# Patient Record
Sex: Male | Born: 1945
Health system: Southern US, Community
[De-identification: ages and names within clinical notes are randomized; demographics above are authoritative.]

## PROBLEM LIST (undated history)

## (undated) DIAGNOSIS — E785 Hyperlipidemia, unspecified: Secondary | ICD-10-CM

## (undated) DIAGNOSIS — I471 Supraventricular tachycardia, unspecified: Secondary | ICD-10-CM

## (undated) DIAGNOSIS — I5042 Chronic combined systolic (congestive) and diastolic (congestive) heart failure: Secondary | ICD-10-CM

## (undated) DIAGNOSIS — I5022 Chronic systolic (congestive) heart failure: Secondary | ICD-10-CM

## (undated) DIAGNOSIS — I509 Heart failure, unspecified: Secondary | ICD-10-CM

## (undated) DIAGNOSIS — M1990 Unspecified osteoarthritis, unspecified site: Secondary | ICD-10-CM

## (undated) DIAGNOSIS — I340 Nonrheumatic mitral (valve) insufficiency: Secondary | ICD-10-CM

## (undated) DIAGNOSIS — N1832 Chronic kidney disease, stage 3b: Secondary | ICD-10-CM

## (undated) DIAGNOSIS — I4892 Unspecified atrial flutter: Secondary | ICD-10-CM

## (undated) DIAGNOSIS — I428 Other cardiomyopathies: Secondary | ICD-10-CM

## (undated) DIAGNOSIS — Z950 Presence of cardiac pacemaker: Secondary | ICD-10-CM

## (undated) DIAGNOSIS — K7581 Nonalcoholic steatohepatitis (NASH): Secondary | ICD-10-CM

## (undated) DIAGNOSIS — I350 Nonrheumatic aortic (valve) stenosis: Secondary | ICD-10-CM

## (undated) DIAGNOSIS — I10 Essential (primary) hypertension: Secondary | ICD-10-CM

## (undated) DIAGNOSIS — Z6837 Body mass index (BMI) 37.0-37.9, adult: Secondary | ICD-10-CM

## (undated) HISTORY — DX: Hyperlipidemia, unspecified: E78.5

## (undated) HISTORY — DX: Unspecified atrial flutter: I48.92

## (undated) HISTORY — DX: Unspecified osteoarthritis, unspecified site: M19.90

## (undated) HISTORY — DX: Essential (primary) hypertension: I10

## (undated) HISTORY — DX: Chronic combined systolic (congestive) and diastolic (congestive) heart failure: I50.42

## (undated) HISTORY — PX: LUMBAR SPINE SURGERY: SHX701

---

## 2001-04-02 ENCOUNTER — Ambulatory Visit (HOSPITAL_COMMUNITY): Admission: RE | Admit: 2001-04-02 | Discharge: 2001-04-02 | Payer: Self-pay | Admitting: Family Medicine

## 2002-04-16 ENCOUNTER — Ambulatory Visit (HOSPITAL_COMMUNITY): Admission: RE | Admit: 2002-04-16 | Discharge: 2002-04-16 | Payer: Self-pay | Admitting: Family Medicine

## 2002-04-16 ENCOUNTER — Encounter: Payer: Self-pay | Admitting: Family Medicine

## 2002-09-21 ENCOUNTER — Encounter: Payer: Self-pay | Admitting: Specialist

## 2002-09-30 ENCOUNTER — Encounter: Payer: Self-pay | Admitting: Specialist

## 2002-09-30 ENCOUNTER — Inpatient Hospital Stay (HOSPITAL_COMMUNITY): Admission: RE | Admit: 2002-09-30 | Discharge: 2002-10-02 | Payer: Self-pay | Admitting: Specialist

## 2003-03-27 HISTORY — PX: KNEE ARTHROSCOPY: SHX127

## 2003-09-28 ENCOUNTER — Ambulatory Visit (HOSPITAL_COMMUNITY): Admission: RE | Admit: 2003-09-28 | Discharge: 2003-09-28 | Payer: Self-pay | Admitting: Family Medicine

## 2004-05-03 ENCOUNTER — Ambulatory Visit (HOSPITAL_COMMUNITY): Admission: RE | Admit: 2004-05-03 | Discharge: 2004-05-03 | Payer: Self-pay | Admitting: Family Medicine

## 2004-05-08 ENCOUNTER — Ambulatory Visit: Payer: Self-pay | Admitting: Orthopedic Surgery

## 2004-05-09 ENCOUNTER — Ambulatory Visit: Payer: Self-pay | Admitting: Cardiology

## 2004-05-09 ENCOUNTER — Ambulatory Visit (HOSPITAL_COMMUNITY): Admission: RE | Admit: 2004-05-09 | Discharge: 2004-05-09 | Payer: Self-pay | Admitting: Orthopedic Surgery

## 2004-05-17 ENCOUNTER — Ambulatory Visit: Payer: Self-pay | Admitting: Cardiology

## 2004-05-17 ENCOUNTER — Ambulatory Visit (HOSPITAL_COMMUNITY): Admission: RE | Admit: 2004-05-17 | Discharge: 2004-05-17 | Payer: Self-pay | Admitting: Cardiology

## 2004-05-25 ENCOUNTER — Ambulatory Visit: Payer: Self-pay | Admitting: Cardiology

## 2004-05-31 ENCOUNTER — Ambulatory Visit: Payer: Self-pay | Admitting: Cardiology

## 2004-06-07 ENCOUNTER — Ambulatory Visit: Payer: Self-pay | Admitting: Orthopedic Surgery

## 2004-06-12 ENCOUNTER — Ambulatory Visit: Payer: Self-pay | Admitting: Internal Medicine

## 2004-06-13 ENCOUNTER — Ambulatory Visit (HOSPITAL_COMMUNITY): Admission: RE | Admit: 2004-06-13 | Discharge: 2004-06-13 | Payer: Self-pay | Admitting: Orthopedic Surgery

## 2004-06-13 ENCOUNTER — Ambulatory Visit: Payer: Self-pay | Admitting: Orthopedic Surgery

## 2004-06-15 ENCOUNTER — Ambulatory Visit: Payer: Self-pay | Admitting: Orthopedic Surgery

## 2004-06-16 ENCOUNTER — Encounter (HOSPITAL_COMMUNITY): Admission: RE | Admit: 2004-06-16 | Discharge: 2004-07-16 | Payer: Self-pay | Admitting: Orthopedic Surgery

## 2004-07-14 ENCOUNTER — Ambulatory Visit: Payer: Self-pay | Admitting: Internal Medicine

## 2004-07-19 ENCOUNTER — Ambulatory Visit: Payer: Self-pay | Admitting: Orthopedic Surgery

## 2004-07-19 ENCOUNTER — Encounter (HOSPITAL_COMMUNITY): Admission: RE | Admit: 2004-07-19 | Discharge: 2004-08-18 | Payer: Self-pay | Admitting: Orthopedic Surgery

## 2004-08-09 ENCOUNTER — Ambulatory Visit: Payer: Self-pay | Admitting: Orthopedic Surgery

## 2004-09-07 ENCOUNTER — Ambulatory Visit: Payer: Self-pay | Admitting: Internal Medicine

## 2004-09-11 ENCOUNTER — Ambulatory Visit: Payer: Self-pay | Admitting: Orthopedic Surgery

## 2004-10-29 ENCOUNTER — Emergency Department (HOSPITAL_COMMUNITY): Admission: EM | Admit: 2004-10-29 | Discharge: 2004-10-29 | Payer: Self-pay | Admitting: Emergency Medicine

## 2004-11-21 ENCOUNTER — Ambulatory Visit (HOSPITAL_COMMUNITY): Admission: RE | Admit: 2004-11-21 | Discharge: 2004-11-21 | Payer: Self-pay | Admitting: Internal Medicine

## 2004-11-21 ENCOUNTER — Ambulatory Visit: Payer: Self-pay | Admitting: Internal Medicine

## 2004-11-21 ENCOUNTER — Encounter (INDEPENDENT_AMBULATORY_CARE_PROVIDER_SITE_OTHER): Payer: Self-pay | Admitting: Internal Medicine

## 2006-08-26 ENCOUNTER — Ambulatory Visit: Payer: Self-pay | Admitting: Cardiology

## 2007-06-27 ENCOUNTER — Ambulatory Visit (HOSPITAL_COMMUNITY): Admission: RE | Admit: 2007-06-27 | Discharge: 2007-06-27 | Payer: Self-pay | Admitting: Cardiology

## 2007-06-27 ENCOUNTER — Ambulatory Visit: Payer: Self-pay | Admitting: Cardiology

## 2007-06-27 LAB — CONVERTED CEMR LAB: Brain Natriuretic Peptide: 253

## 2007-06-30 ENCOUNTER — Encounter: Payer: Self-pay | Admitting: Cardiology

## 2007-06-30 ENCOUNTER — Ambulatory Visit: Payer: Self-pay | Admitting: Cardiology

## 2007-06-30 ENCOUNTER — Ambulatory Visit (HOSPITAL_COMMUNITY): Admission: RE | Admit: 2007-06-30 | Discharge: 2007-06-30 | Payer: Self-pay | Admitting: Cardiology

## 2007-08-06 ENCOUNTER — Ambulatory Visit: Payer: Self-pay | Admitting: Cardiology

## 2007-09-03 ENCOUNTER — Ambulatory Visit: Payer: Self-pay | Admitting: Cardiology

## 2007-11-08 LAB — CONVERTED CEMR LAB: Hgb A1c MFr Bld: 6.8 %

## 2007-12-09 ENCOUNTER — Ambulatory Visit: Payer: Self-pay | Admitting: Cardiology

## 2008-09-28 ENCOUNTER — Encounter (INDEPENDENT_AMBULATORY_CARE_PROVIDER_SITE_OTHER): Payer: Self-pay | Admitting: *Deleted

## 2008-10-07 ENCOUNTER — Encounter (INDEPENDENT_AMBULATORY_CARE_PROVIDER_SITE_OTHER): Payer: Self-pay | Admitting: *Deleted

## 2008-10-14 ENCOUNTER — Telehealth: Payer: Self-pay | Admitting: Cardiology

## 2009-12-19 ENCOUNTER — Encounter (INDEPENDENT_AMBULATORY_CARE_PROVIDER_SITE_OTHER): Payer: Self-pay | Admitting: *Deleted

## 2009-12-19 LAB — CONVERTED CEMR LAB
AST: 19 units/L
Albumin: 4.2 g/dL
Bilirubin, Direct: 0.2 mg/dL
Cholesterol: 201 mg/dL
Creatinine, Ser: 0.85 mg/dL
HDL: 54 mg/dL
LDL Cholesterol: 97 mg/dL
Potassium: 4.6 meq/L
Total Protein: 7.2 g/dL
Triglycerides: 248 mg/dL

## 2009-12-20 ENCOUNTER — Encounter (INDEPENDENT_AMBULATORY_CARE_PROVIDER_SITE_OTHER): Payer: Self-pay | Admitting: *Deleted

## 2009-12-23 ENCOUNTER — Ambulatory Visit: Payer: Self-pay | Admitting: Cardiology

## 2009-12-23 DIAGNOSIS — I10 Essential (primary) hypertension: Secondary | ICD-10-CM

## 2009-12-23 DIAGNOSIS — M1990 Unspecified osteoarthritis, unspecified site: Secondary | ICD-10-CM | POA: Insufficient documentation

## 2009-12-23 DIAGNOSIS — I471 Supraventricular tachycardia: Secondary | ICD-10-CM

## 2009-12-23 DIAGNOSIS — E782 Mixed hyperlipidemia: Secondary | ICD-10-CM | POA: Insufficient documentation

## 2009-12-23 DIAGNOSIS — E1159 Type 2 diabetes mellitus with other circulatory complications: Secondary | ICD-10-CM

## 2010-01-05 ENCOUNTER — Telehealth (INDEPENDENT_AMBULATORY_CARE_PROVIDER_SITE_OTHER): Payer: Self-pay | Admitting: *Deleted

## 2010-01-11 ENCOUNTER — Telehealth (INDEPENDENT_AMBULATORY_CARE_PROVIDER_SITE_OTHER): Payer: Self-pay

## 2010-02-03 ENCOUNTER — Ambulatory Visit: Payer: Self-pay | Admitting: Cardiology

## 2010-02-03 ENCOUNTER — Encounter: Payer: Self-pay | Admitting: Cardiology

## 2010-03-01 ENCOUNTER — Telehealth (INDEPENDENT_AMBULATORY_CARE_PROVIDER_SITE_OTHER): Payer: Self-pay

## 2010-03-13 ENCOUNTER — Ambulatory Visit: Payer: Self-pay | Admitting: Cardiology

## 2010-04-18 ENCOUNTER — Encounter: Payer: Self-pay | Admitting: Cardiology

## 2010-04-18 ENCOUNTER — Ambulatory Visit
Admission: RE | Admit: 2010-04-18 | Discharge: 2010-04-18 | Payer: Self-pay | Source: Home / Self Care | Attending: Cardiology | Admitting: Cardiology

## 2010-04-18 ENCOUNTER — Encounter (INDEPENDENT_AMBULATORY_CARE_PROVIDER_SITE_OTHER): Payer: Self-pay | Admitting: *Deleted

## 2010-04-25 NOTE — Assessment & Plan Note (Signed)
Summary: past due for 1 yr f/u per pt phone call/tg  Medications Added METOPROLOL SUCCINATE 100 MG XR24H-TAB (METOPROLOL SUCCINATE) Take one tablet by mouth daily LIPITOR 40 MG TABS (ATORVASTATIN CALCIUM) take 1 tab daily HYDROCHLOROTHIAZIDE 25 MG TABS (HYDROCHLOROTHIAZIDE) take 1/2 tab daily VERAPAMIL HCL CR 240 MG CR-TABS (VERAPAMIL HCL) Take 1 tablet by mouth once a day JANUMET 50-1000 MG TABS (SITAGLIPTIN-METFORMIN HCL) take 1 tab two times a day GLYBURIDE 5 MG TABS (GLYBURIDE) take 2 tabs two times a day CIALIS 20 MG TABS (TADALAFIL) take as needed BENAZEPRIL HCL 20 MG TABS (BENAZEPRIL HCL) Take 1 tablet by mouth once a day CATAPRES-TTS-3 0.3 MG/24HR PTWK (CLONIDINE HCL) apply one patch weekly      Allergies Added: NKDA  Visit Type:  Follow-up Primary Provider:  Dr. Rosemary Holms   History of Present Illness: John [name] returns to the office after a two-year hiatus for reassessment of PSVT, hypertension and mild aortic stenosis.  Since his last visit, he has done extremely well.  He has not been hospitalized or required urgent medical care.  He has no new medical problems.  BP control has been somewhat suboptimal, but antihypertensive medications have been increased.  He does not assess blood pressure on his own.  He denies orthopnea, PND, exertional dyspnea, chest discomfort, or syncope.  He occasionally notes orthostatic lightheadedness that passes in a matter of seconds.  EKG  Procedure date:  12/23/2009  Findings:      Sinus bradycardia at a rate of 50 bpm First degree AV block with a PR interval of 290 ms Delayed R-wave progression suggesting prior AMI Comparison with prior tracing of 06/27/07: Rate has decreased and PR interval increased; PVCs no longer present; delayed R-wave progression is unchanged.  -  Date:  11/08/2007    HgbA1c: 6.8  Date:  06/27/2007    BNP: 253   Preventive Screening-Counseling & Management  Alcohol-Tobacco     Smoking Status:  quit  Current Medications (verified): 1)  Metoprolol Succinate 100 Mg Xr24h-Tab (Metoprolol Succinate) .... Take One Tablet By Mouth Daily 2)  Lipitor 40 Mg Tabs (Atorvastatin Calcium) .... Take 1 Tab Daily 3)  Hydrochlorothiazide 25 Mg Tabs (Hydrochlorothiazide) .... Take 1/2 Tab Daily 4)  Verapamil Hcl Cr 240 Mg Cr-Tabs (Verapamil Hcl) .... Take 1 Tablet By Mouth Once A Day 5)  Janumet 50-1000 Mg Tabs (Sitagliptin-Metformin Hcl) .... Take 1 Tab Two Times A Day 6)  Glyburide 5 Mg Tabs (Glyburide) .... Take 2 Tabs Two Times A Day 7)  Cialis 20 Mg Tabs (Tadalafil) .... Take As Needed 8)  Benazepril Hcl 20 Mg Tabs (Benazepril Hcl) .... Take 1 Tablet By Mouth Once A Day 9)  Catapres-Tts-3 0.3 Mg/24hr Ptwk (Clonidine Hcl) .... Apply One Patch Weekly  Allergies (verified): No Known Drug Allergies  Comments:  Nurse/Medical Assistant: per Dr.Luking janumet started per Dr.Preston Clark hctz,doxazosin,cahnged per Dr Sallee Lange also verapamil started per Dr.Luking.  Past History:  Family History: Last updated: 12/23/2009 Mother:age 54-coronary disease, chronic obstructive pulmonary disease, diabetes and hypertension. Father-h/o myocardial infarction; died at age 5 Siblings: 3 brothers who are alive and well  Social History: Last updated: 12/23/2009 Employment-correctional officer Married; lives locally; 2 adult children  Alcohol Use - no Regular Exercise - no Drug Use - no Tobacco Use - Former; 30 pack years discontinued in 1986  PMH, [place], and Social History reviewed and updated.  Past Medical History: PSVT-onset in 2006 Hypertension Hyperlipidemia Aortic stenosis-mild Tobacco abuse-30 pack years discontinued in  1986 Congestive heart failure: Actos vs. diastolic dysfunction with normal EF Diabetes-no insulin Degenerative joint disease-status post left TKA Elevation of d-dimer-1.5 in 2009  Past Surgical History: Left knee arthroscopy Lumbosacral spine surgery x5 Left  TKA  Family History: Mother:age 38-coronary disease, chronic obstructive pulmonary disease, diabetes and hypertension. 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  Vital Signs:  Patient profile:   65 year old male Height:      73 inches Weight:      282 pounds BMI:     37.34 Pulse rate:   52 / minute Pulse (ortho):   49 / minute BP sitting:   149 / 71  (right arm) BP standing:   150 / 74  Vitals Entered By: Doretha Sou, CNA (December 23, 2009 12:52 PM)  Serial Vital Signs/Assessments:  Time      Position  BP       Pulse  Resp  Temp     By 2:06 PM   Lying LA  159/75   45                    Tammy Sanders RN 2:06 PM   Standing  150/74   34                    Tammy Sanders RN  Comments: 2:06 PM no symptoms By: Tye Savoy RN    Physical Exam  General:  Overweight; well developed; no acute distress:   Neck-No JVD; no carotid bruits: Lungs-No tachypnea, no rales; no rhonchi; no wheezes: Cardiovascular-normal PMI; normal S1 and E9:FYBOFB holosystolic grade 1/6 murmur at the left sternal border Abdomen-BS normal; soft and non-tender without masses or organomegaly:  Musculoskeletal-No deformities, no cyanosis or clubbing: Neurologic-Normal cranial nerves; symmetric strength and tone:  Skin-Warm, no significant lesions: Extremities-Nl distal pulses; no edema:     Impression & Recommendations:  Problem # 1:  PAROXYSMAL SUPRAVENTRICULAR TACHYCARDIA (ICD-427.0) Arrhythmia has been controlled with high-dose verapamil plus moderate dose metoprolol; unfortunately, he now has significant conduction system dysfunction with a markedly prolonged  PR interval and sinus bradycardia.  His dose of metoprolol will be decreased to 100 mg q.d. and changed to the succinate form.  Verapamil dose will be halved to 240 mg q.d.  A repeat rhythm strip will be obtained in one month.  Problem # 2:  HYPERTENSION (ICD-401.1) Repeat blood pressure at the end of his visit with a thigh cuff still showed a systolic of 510.  He is on 5 antihypertensives, and I hate to add yet another.  Clonidine TTS-III will be added and Cardura discontinued. (Patient reports no symptoms of prostatic enlargement.)  He will monitor blood pressure at a local pharmacy, maintain a record and return in one month for reassessment by the cardiology nurses.  Problem # 3:  HYPERLIPIDEMIA (CHE-527.4) Recent lipid profile was good, but not optimal.  Since the patient has no known coronary disease, LDL of 70 or less is not absolutely necessary.  When it comes time to renew his prescription for Lipitor, an increased to 80 mg q.d. could be considered for slightly better control of hyperlipidemia  in the setting of diabetes.  Problem # 4:  AORTIC STENOSIS-MILD (ICD-424.1) Patient has no symptoms referrable to valvular disease.  Patient Instructions: 1)  Your physician recommends that you schedule a follow-up appointment in: 4 months 2)  Your physician has recommended you make the following change in your medication: after current rx, change metloprolol to succinate 169m daily, decrease verapamil to 1 tablet daily, clonidine tts 3 patch weekly, after 1st day stop cardura 845mdaily 3)  You have been referred to nurse visit for bp and rhythm strip 4)  Your physician has requested that you regularly monitor and record your blood pressure readings at home.  Please use the same machine at the same time of day to check your readings and record them to bring to your follow-up visit. at local drug stores Prescriptions: CATAPRES-TTS-3 0.3 MG/24HR PTWK (CLONIDINE HCL) apply one patch weekly  #15 x 3    Entered by:   TaTye SavoyN   Authorized by:   RoYehuda SavannahMD, FANortheast Regional Medical Center Signed by:   TaTye SavoyN on 12/23/2009   Method used:   [facility]  [phone][facility]       [address]     [place]  [address]     Ph: [phone]     Fax: [phone] RxID:   [id number] HCL 20 MG TABS (BENAZEPRIL HCL) Take 1 tablet by mouth once a day  #90 x 1   Entered by:   TaTye SavoyN   Authorized by:   RoYehuda SavannahMD, FAOp[facility] Signed by:   TaTye SavoyN on 12/23/2009   Method used:   [facility] ...       [facility]  [address][phone]([facility]ail)       [address]     [place]  [address]     Ph: [phone]     Fax: [phone] RxID:   [id number] HCL CR 240 MG CR-TABS (VERAPAMIL HCL) Take 1 tablet by mouth once a day  #90 x 3   Entered by:   TaTye SavoyN   Authorized by:   RoYehuda SavannahMD, FABrazoria County Surgery Center LLC Signed by:   TaTye SavoyN on 12/23/2009   Method used:   [facility] ...       [facility]  [address][phone]([facility]ail[facility]       [address]     [place]  [address]     Ph: [phone]     Fax: [phone] RxID:   [id number] SUCCINATE 100 MG XR24H-TAB (METOPROLOL SUCCINATE) Take one tablet by mouth daily  #90 x 2   Entered by:   TaTye SavoyN   Authorized by:   RoYehuda SavannahMD, FAWood County Hospital Signed by:   TaTye SavoyN on 12/23/2009   Method used:   [facility]       [phone][facility]ail[facility]       [address]     [place]  [address]  Ph: [phone] or [phone]       Fax: [phone]   RxID:   [id number]

## 2010-04-25 NOTE — Letter (Signed)
Summary: BP LIST  BP LIST   Imported By: [place] Crane 02/03/2010 10:25:42  _____________________________________________________________________  External Attachment:    Type:   Image     Comment:   External Document

## 2010-04-25 NOTE — Progress Notes (Signed)
**Note De-Identified [place] Obfuscation** Summary: REfill   Phone Note Call from Patient   Caller: Patient Reason for Call: Refill Medication Summary of Call: needs refill for Doxizosin/Cardura sent to [facility] / [facility]g Initial call taken by: Alphonsus Sias [facility],  March 01, 2010 9:53 AM    Prescriptions: CARDURA 8 MG TABS (DOXAZOSIN MESYLATE) Take 1 tablet by mouth once a day  #90 x 2   Entered by:   Jeani Hawking Olita Takeshita LPN   Authorized by:   Yehuda Savannah, MD, [facility]   Signed by:   Jeani Hawking Atziri Zubiate LPN on 14/64/3142   Method used:   Faxed to ...       [facility] (mail-order)             , [place]         Ph: [phone]       Fax: [phone]   RxID:   [id number]

## 2010-04-25 NOTE — Miscellaneous (Signed)
Summary: LABS BMP,LIPIDS,LIVER,12/19/2009  Clinical Lists Changes  Observations: Added new observation of CALCIUM: 9.1 mg/dL (12/19/2009 10:34) Added new observation of ALBUMIN: 4.2 g/dL (12/19/2009 10:34) Added new observation of PROTEIN, TOT: 7.2 g/dL (12/19/2009 10:34) Added new observation of SGPT (ALT): 18 units/L (12/19/2009 10:34) Added new observation of SGOT (AST): 19 units/L (12/19/2009 10:34) Added new observation of ALK PHOS: 49 units/L (12/19/2009 10:34) Added new observation of BILI DIRECT: 0.2 mg/dL (12/19/2009 10:34) Added new observation of CREATININE: 0.85 mg/dL (12/19/2009 10:34) Added new observation of BUN: 13 mg/dL (12/19/2009 10:34) Added new observation of BG RANDOM: 193 mg/dL (12/19/2009 10:34) Added new observation of CO2 PLSM/SER: 23 meq/L (12/19/2009 10:34) Added new observation of CL SERUM: 103 meq/L (12/19/2009 10:34) Added new observation of K SERUM: 4.6 meq/L (12/19/2009 10:34) Added new observation of NA: 139 meq/L (12/19/2009 10:34) Added new observation of LDL: 97 mg/dL (12/19/2009 10:34) Added new observation of HDL: 54 mg/dL (12/19/2009 10:34) Added new observation of TRIGLYC TOT: 248 mg/dL (12/19/2009 10:34) Added new observation of CHOLESTEROL: 201 mg/dL (12/19/2009 10:34)

## 2010-04-25 NOTE — Assessment & Plan Note (Signed)
Summary: 6 wk nurse visit for BP check and rhythm strip/tg  Nurse Visit   Vital Signs:  Patient profile:   65 year old male Height:      73 inches Weight:      283 pounds Pulse rate:   66 / minute BP sitting:   173 / 75  (left arm)  Vitals Entered By: Tye Savoy RN (February 03, 2010 9:41 AM)  Current Medications (verified): 1)  Metoprolol Succinate 100 Mg Xr24h-Tab (Metoprolol Succinate) .... Take One Tablet By Mouth Daily 2)  Lipitor 40 Mg Tabs (Atorvastatin Calcium) .... Take 1 Tab Daily 3)  Hydrochlorothiazide 25 Mg Tabs (Hydrochlorothiazide) .... Take 1 Tablet By Mouth Once A Day 4)  Verapamil Hcl Cr 240 Mg Cr-Tabs (Verapamil Hcl) .... Take 1 Tablet By Mouth Once A Day 5)  Janumet 50-1000 Mg Tabs (Sitagliptin-Metformin Hcl) .... Take 1 Tab Two Times A Day 6)  Glyburide 5 Mg Tabs (Glyburide) .... Take 2 Tabs Two Times A Day 7)  Cialis 20 Mg Tabs (Tadalafil) .... Take As Needed 8)  Benazepril Hcl 40 Mg Tabs (Benazepril Hcl) .... Take 1 Tablet By Mouth Once A Day 9)  Cardura 8 Mg Tabs (Doxazosin Mesylate) .... Take 1 Tablet By Mouth Once A Day  Allergies (verified): No Known Drug Allergies  Visit Type:  6 week nurse visit Primary Provider:  Dr. Rosemary Holms   History of Present Illness: S:  6 week nurse visit B: ov on 12/23/2009, changed metoprolol to succinate 16m daily, decreased verapamil to 2463mdaily, stoped cardura 63m32maily and started catapress TTS III, A: pt was unable to tolerate patch(constipation, severe dry mouth, unable to even chew gum and elevated blood glucose readings per Pt), stopped on 12/30/2009, pt went back on cardura at 4mg33mily, bp diary and rhythm strip  scanned into record R;I was unsure if this was a true allergy but pt refuses to go back on catapres  February 09, 2010 Increase HCTZ to 25mg42me daily Increase lisinopril to 40 mg once daily Increase Cardura to 8 mg once daily BP check in 1 month; home BPs.  RoberJacqulyn DuckingD.  Discussed changes with pt, nurse visit scheduled  TammyTye SavoyNovember 17, 2011 11:28 AM

## 2010-04-25 NOTE — Progress Notes (Signed)
Summary: NEW RX REACTIONS   Phone Note Call from Patient Call back at Home Phone [phone]   Caller: PT Reason for Call: Talk to Nurse Summary of Call: PT WAS GIVEN A NEW RX PATCH FOR BP AND HE IS HAVING DRY MOUTH DRY STOOLS AND MAKING BP GO HIGH AND SUGAR HIGH. Initial call taken by: [place] Crane,  January 11, 2010 1:04 PM  Follow-up for Phone Call        S: pt called to let us know that he is not wearing his clonidine patch B:at last ov pt's medications were changed, placed on clonidine TTS 3, changed metorpolol to succinate 19m daily,decrease verapamil to 1daily, and stopped cardura 822mdaily A:pt wore patch 3 days, had severe dry mouth,constipation, stated his bp and blood sugar was elevated... he was unable to give me any values.  I asked pt to use rinses and gum. R: he stated he had tried all that and nothing worked, bp today 135/67, 45  Follow-up by: TaTye SavoyN,  January 11, 2010 4:16 PM  Additional Follow-up for Phone Call Additional follow up Details #1::        D/C clonidine TTS. Cardura 1 mg on night 1; 2 mg night 2-4, then 4 mg Q PM.   RN BP check in 3 weeks.  Home BPs.  Additional Follow-up by: RoYehuda SavannahMD, FAIndiana University Health [facility] January 17, 2010 7:24 PM    Additional Follow-up for Phone Call Additional follow up Details #2::    Cardura 91m65m7 tablets) [facility] [place] aware and repeated instructions back to me. Also, pt. is scheduled to see Dr. RotLattie Haw 11-11 and has been monitoring home BP's and wiil bring to OV. Follow-up by: LynJeani Hawkinga LPN,  October 26, 2010086:21 AM

## 2010-04-25 NOTE — Progress Notes (Signed)
   Phone Note Call from Patient   Caller: John [name] Reason for Call: Talk to Nurse Summary of Call: Did you fax Rx to [facility] after the 9/30 office visit. He has not heard from [place].  He can be reached at [place] Initial call taken by: Neoma Laming,  January 05, 2010 9:22 AM  Follow-up for Phone Call        [facility], [facility] Follow-up by: Tye Savoy RN,  January 05, 2010 1:30 PM    Prescriptions: METOPROLOL SUCCINATE 100 MG XR24H-TAB (METOPROLOL SUCCINATE) Take one tablet by mouth daily  #90 x 2   Entered by:   Tye Savoy RN   Authorized by:   Yehuda Savannah, MD, [facility]   Signed by:   Tye Savoy RN on 01/05/2010   Method used:   Faxed to ...       [facility] (mail-order)             , [place]         Ph: [phone]       Fax: [phone]   RxID:   [id number] VERAPAMIL HCL CR 240 MG CR-TABS (VERAPAMIL HCL) Take 1 tablet by mouth once a day  #90 x 3   Entered by:   Tye Savoy RN   Authorized by:   Yehuda Savannah, MD, [facility]   Signed by:   Tye Savoy RN on 01/05/2010   Method used:   Faxed to ...       [place] (mail-order)             , [place]         Ph: [phone]       Fax: [phone]   RxID:   [id number] BENAZEPRIL HCL 20 MG TABS (BENAZEPRIL HCL) Take 1 tablet by mouth once a day  #90 x 1   Entered by:   Tye Savoy RN   Authorized by:   Yehuda Savannah, MD, [facility]   Signed by:   Tye Savoy RN on 01/05/2010   Method used:   Faxed to ...       [facility] (mail-order)             , [place]         Ph: [phone]       Fax: [phone]   RxID:   [id number] CATAPRES-TTS-3 0.3 MG/24HR PTWK (CLONIDINE HCL) apply one patch weekly  #15 x 3   Entered by:   Tye Savoy RN   Authorized by:   Yehuda Savannah, MD, [facility]   Signed by:   Tye Savoy RN on 01/05/2010   Method used:   Faxed to ...       [place] (mail-order)             , [place]         Ph: [phone]       Fax: [phone]   RxID:    [id number]

## 2010-04-27 NOTE — Letter (Signed)
Summary: [place] [facility] Work [facility], general practice at [place]. [address], [place] [address]   Phone: [phone]  Fax: [phone]     April 18, 2010 MRN: [id number]   John [name] [address] [place], [place]  [address]      YOUR LAB WORK IS DUE   May 19, 2010  Please go to [facility], located across the street from [facility] on the second floor.  Hours are Monday - Friday 7am until 7:30pm         Saturday 8am until 12noon      _X_ YOUR LABWORK IS NOT FASTING --YOU MAY EAT PRIOR TO LABWORK

## 2010-04-27 NOTE — Letter (Signed)
Summary: bp log  bp log   Imported By: [place] Crane 04/18/2010 13:11:45  _____________________________________________________________________  External Attachment:    Type:   Image     Comment:   External Document

## 2010-04-27 NOTE — Letter (Signed)
Summary: BP LOG  BP LOG   Imported By: [place] Crane 03/13/2010 10:38:56  _____________________________________________________________________  External Attachment:    Type:   Image     Comment:   External Document

## 2010-04-27 NOTE — Assessment & Plan Note (Signed)
Summary: NURSE VISIT BP CHECK  Nurse Visit   Vital Signs:  Patient profile:   65 year old male Height:      73 inches Weight:      280 pounds O2 Sat:      97 % on Room air Temp:     97.2 degrees F oral Pulse (ortho):   54 / minute BP standing:   167 / 84  (left arm)  Vitals Entered By: Tye Savoy RN (March 13, 2010 10:07 AM)  O2 Flow:  Room air  Serial Vital Signs/Assessments:  Time      Position  BP       Pulse  Resp  Temp     By 10:11 AM  Lying LA  169/83   48                    Zykera Abella RN 10:11 AM  Sitting   170/79   49                    Tye Savoy RN 10:11 AM  Standing  167/84   54                    Kasin Tonkinson RN  Comments: 10:11 AM no compaints By: Tye Savoy RN    Current Medications (verified): 1)  Metoprolol Succinate 100 Mg Xr24h-Tab (Metoprolol Succinate) .... Take One Tablet By Mouth Daily 2)  Lipitor 40 Mg Tabs (Atorvastatin Calcium) .... Take 1 Tab Daily 3)  Hydrochlorothiazide 25 Mg Tabs (Hydrochlorothiazide) .... Take 1 Tablet By Mouth Once A Day 4)  Verapamil Hcl Cr 240 Mg Cr-Tabs (Verapamil Hcl) .... Take 1 Tablet By Mouth Once A Day 5)  Janumet 50-1000 Mg Tabs (Sitagliptin-Metformin Hcl) .... Take 1 Tab Two Times A Day 6)  Glyburide 5 Mg Tabs (Glyburide) .... Take 2 Tabs Two Times A Day 7)  Cialis 20 Mg Tabs (Tadalafil) .... Take As Needed 8)  Benazepril Hcl 40 Mg Tabs (Benazepril Hcl) .... Take 1 Tablet By Mouth Once A Day 9)  Cardura 8 Mg Tabs (Doxazosin Mesylate) .... Take 1 Tablet By Mouth Once A Day  Allergies (verified): No Known Drug Allergies  Visit Type:  1 month nurse visit follow up Primary Provider:  Dr. Rosemary Holms   History of Present Illness: S: 1 month nurse visit follow up B: nurse visit 02/03/10, increase hctz to 60m daily, increased lisinopril to 429mdaily, increased cardura to 70m55maily and home bp A: occas dizziness when walking up stairs, denies any other complaints, ortho vs, bp diary scanned  into record R:  03/16/10  Home BPs document adequate control.  Continue current Rx.  RobJacqulyn Ducking.D.  pt made aware

## 2010-05-03 NOTE — Assessment & Plan Note (Signed)
Summary: 4 mth f/u per checkout on 12/23/09/tg  Medications Added LIPITOR 80 MG TABS (ATORVASTATIN CALCIUM) Take one tablet by mouth daily. HYDROCHLOROTHIAZIDE 25 MG TABS (HYDROCHLOROTHIAZIDE) Take 1 tablet by mouth once a day AMLODIPINE BESYLATE 5 MG TABS (AMLODIPINE BESYLATE) Take one tablet by mouth daily      Allergies Added: NKDA  Visit Type:  Follow-up Primary Provider:  Dr. Rosemary Holms   History of Present Illness: [name] returns to the office as scheduled for continued assessment and treatment of hypertension, hyperlipidemia and supraventricular tachycardia.  Since his last visit, he has done extremely well.  He reports no new medical problems and no Emergency Department or hospital visits.  He gardens and walks without dyspnea or chest discomfort.  Home blood pressures show systolics predominantly in the 140s and 150s despite a 5 drug regimen, most of which are at maximal dose, but adequate control of diastolic pressure.     Current Medications (verified): 1)  Metoprolol Succinate 100 Mg Xr24h-Tab (Metoprolol Succinate) .... Take One Tablet By Mouth Daily 2)  Lipitor 80 Mg Tabs (Atorvastatin Calcium) .... Take One Tablet By Mouth Daily. 3)  Hydrochlorothiazide 25 Mg Tabs (Hydrochlorothiazide) .... Take 1 Tablet By Mouth Once A Day 4)  Verapamil Hcl Cr 240 Mg Cr-Tabs (Verapamil Hcl) .... Take 1 Tablet By Mouth Once A Day 5)  Janumet 50-1000 Mg Tabs (Sitagliptin-Metformin Hcl) .... Take 1 Tab Two Times A Day 6)  Glyburide 5 Mg Tabs (Glyburide) .... Take 2 Tabs Two Times A Day 7)  Cialis 20 Mg Tabs (Tadalafil) .... Take As Needed 8)  Benazepril Hcl 40 Mg Tabs (Benazepril Hcl) .... Take 1 Tablet By Mouth Once A Day 9)  Cardura 8 Mg Tabs (Doxazosin Mesylate) .... Take 1 Tablet By Mouth Once A Day 10)  Amlodipine Besylate 5 Mg Tabs (Amlodipine Besylate) .... Take One Tablet By Mouth Daily  Allergies (verified): No Known Drug Allergies  Comments:  Nurse/Medical  Assistant: patient stated all meds are correct from last ov no list no meds reviewed previous ov  Past History:  PMH, FH, and Social History reviewed and updated.  Review of Systems       See history of present illness.  Vital Signs:  Patient profile:   65 year old male Weight:      279 pounds BMI:     36.94 O2 Sat:      96 % on Room air Pulse rate:   56 / minute BP sitting:   156 / 71  (left arm)  Vitals Entered By: Doretha Sou, CNA (April 18, 2010 11:20 AM)  O2 Flow:  Room air  Serial Vital Signs/Assessments:  Time      Position  BP       Pulse  Resp  Temp     By 11:34 AM            148/73   48                    Tammy Sanders RN   Physical Exam  General:  Overweight; well developed; no acute distress:   Neck-No JVD; no carotid bruits: Lungs-No tachypnea, no rales; no rhonchi; no wheezes: Cardiovascular-normal PMI; normal S1 and X3:ATFTDD holosystolic grade 1/6 murmur at the left sternal border Abdomen-BS normal; soft and non-tender without masses or organomegaly:  Musculoskeletal-No deformities, no cyanosis or clubbing: Neurologic-Normal cranial nerves; symmetric strength and tone:  Skin-Warm, no significant lesions: Extremities-Nl distal pulses; no edema:  Impression & Recommendations:  Problem # 1:  HYPERTENSION (ICD-401.1) Blood pressure control remains suboptimal for a diabetic.  Amlodipine 5 mg q.d. will be added to his medical regime and hydrochlorothiazide dosage increased to 25 mg q.d.  Patient will continue to follow blood pressure at home and return to see the cardiology nurses in one month for reassessment.  Problem # 2:  HYPERLIPIDEMIA (WUX-324.4) Lipid control is borderline for diabetic.  Atorvastatin dosage will be increased to 80 mg q.d.  Problem # 3:  PAROXYSMAL SUPRAVENTRICULAR TACHYCARDIA (ICD-427.0) No clinical evidence for recurrence; treatment with verapamil and beta blocker will continue.  I will plan to see this nice gentleman  again in one year.  Other Orders: Future Orders: T-Basic Metabolic Panel ([id number]) ... 05/19/2010 T-Lipid Profile [id number]) ... 05/19/2010  Patient Instructions: 1)  Your physician recommends that you schedule a follow-up appointment in: 1 YEAR 2)  Your physician recommends that you return for lab work in: 1 MONTH 3)  Your physician has recommended you make the following change in your medication: INCREASE HYDROCHLORATHIAZIDE TO 1 TABLET DAILY, AMLODIPINE 5MG DAILY, INCREASE LIPITOR TO 80MG DAILY 4)  You have been referred to NURSE VISIT IN 1 MONTH, PLEASE BRING BP DIARY TO NURSE VISIT 5)  Your physician has requested that you regularly monitor and record your blood pressure readings at home.  Please use the same machine at the same time of day to check your readings and record them to bring to your follow-up visit. Prescriptions: AMLODIPINE BESYLATE 5 MG TABS (AMLODIPINE BESYLATE) Take one tablet by mouth daily  #90 x 1   Entered by:   Tye Savoy RN   Authorized by:   Yehuda Savannah, MD, [facility]   Signed by:   Tye Savoy RN on 04/18/2010   Method used:   Faxed to ...       [facility] (mail-order)             , [place]         Ph: [phone]       Fax: [phone]   RxID:   [id number] HYDROCHLOROTHIAZIDE 25 MG TABS (HYDROCHLOROTHIAZIDE) Take 1 tablet by mouth once a day  #30 x 3   Entered by:   Tye Savoy RN   Authorized by:   Yehuda Savannah, MD, [facility]   Signed by:   Tye Savoy RN on 04/18/2010   Method used:   Faxed to ...       [facility] (mail-order)             , [place]         Ph: [phone]       Fax: [phone]   RxID:   [id number] LIPITOR 80 MG TABS (ATORVASTATIN CALCIUM) Take one tablet by mouth daily.  #30 x 3   Entered by:   Tye Savoy RN   Authorized by:   Yehuda Savannah, MD, [facility]   Signed by:   Tye Savoy RN on 04/18/2010   Method used:   Faxed to ...       [facility] (mail-order)             , [place]         Ph: [phone]       Fax:  [phone]   RxID:   [id number] LIPITOR 80 MG TABS (ATORVASTATIN CALCIUM) Take one tablet by mouth daily.  #30 x 3   Entered by:   Tye Savoy RN   Authorized by:  Yehuda Savannah, MD, [facility]   Signed by:   Tye Savoy RN on 04/18/2010   Method used:   Electronically to        [facility]. [phone]* (retail)       [address]       [place], [place]  [address]       Ph: [phone]       Fax: [phone]   RxID:   [id number] AMLODIPINE BESYLATE 5 MG TABS (AMLODIPINE BESYLATE) Take one tablet by mouth daily  #30 x 3   Entered by:   Tye Savoy RN   Authorized by:   Yehuda Savannah, MD, [facility]   Signed by:   Tye Savoy RN on 04/18/2010   Method used:   Electronically to        [facility] [phone]* (retail)       [address]       [place], [place]  [address]       Ph: [phone]       Fax: [phone]   RxID:   [id number] HYDROCHLOROTHIAZIDE 25 MG TABS (HYDROCHLOROTHIAZIDE) Take 1 tablet by mouth once a day  #30 x 3   Entered by:   Tye Savoy RN   Authorized by:   Yehuda Savannah, MD, [facility]   Signed by:   Tye Savoy RN on 04/18/2010   Method used:   Electronically to        [facility]. [phone]* (retail)       [address]       [place], [place]  [address]       Ph: [phone]       Fax: [phone]   RxID:   [id number]

## 2010-05-04 ENCOUNTER — Other Ambulatory Visit (INDEPENDENT_AMBULATORY_CARE_PROVIDER_SITE_OTHER): Payer: Self-pay | Admitting: Internal Medicine

## 2010-05-04 ENCOUNTER — Ambulatory Visit (HOSPITAL_COMMUNITY)
Admission: RE | Admit: 2010-05-04 | Discharge: 2010-05-04 | Disposition: A | Discharge status: Home / Self Care | Payer: Medicare Other | Source: Ambulatory Visit | Attending: Internal Medicine | Admitting: Internal Medicine

## 2010-05-04 ENCOUNTER — Encounter (HOSPITAL_BASED_OUTPATIENT_CLINIC_OR_DEPARTMENT_OTHER): Payer: Medicare Other | Admitting: Internal Medicine

## 2010-05-04 DIAGNOSIS — D126 Benign neoplasm of colon, unspecified: Secondary | ICD-10-CM

## 2010-05-04 DIAGNOSIS — K5730 Diverticulosis of large intestine without perforation or abscess without bleeding: Secondary | ICD-10-CM

## 2010-05-04 DIAGNOSIS — Z860100 Personal history of colon polyps, unspecified: Secondary | ICD-10-CM

## 2010-05-04 DIAGNOSIS — Z1211 Encounter for screening for malignant neoplasm of colon: Secondary | ICD-10-CM

## 2010-05-08 NOTE — Op Note (Signed)
  NAME:  John [name], John [name] NO.:  [id number]  MEDICAL RECORD NO.:  [id number]           PATIENT TYPE:  O  LOCATION:  DAYP                          FACILITY:  [facility]  PHYSICIAN:  Hildred Laser, M.D.    DATE OF BIRTH:  March 26, 1946  DATE OF PROCEDURE:  05/04/2010 DATE OF DISCHARGE:                              OPERATIVE REPORT   PROCEDURE:  Colonoscopy.  INDICATION:  The patient is a 64 year old male African American male with history of colonic polyps.  Procedures were reviewed with the patient.  Informed consent was obtained.  The patient's last exam was over 5 years ago.  MEDS FOR CONSCIOUS SEDATION: 1. Demerol 50 mg IV. 2. Versed 6 mg IV.  FINDINGS:  Procedure performed in endoscopy suite.  The patient's vital signs and O2 sat were monitored during the procedure and remained stable.  The patient was placed in left lateral recumbent position and rectal examination was performed.  No abnormality noted on external ordigital exam.  Pentax videoscope was placed in the rectum and advanced under vision into sigmoid colon and beyond.  Preparation was satisfactory.  Multiple diverticula noted throughout the colon but most of these were at sigmoid colon.  Scope was passed into cecum which was identified by lipomatous ileocecal valve and appendiceal orifice.  Short segment of GI was also examined and was normal.  As the scope was withdrawn, colonic mucosa was carefully examined.  There was a small cecal polyp which was ablated via cold biopsy.  There are three small polyps in the distal transverse colon located close to one another. These are also ablated via cold biopsy and submitted in one container. Rest of the mucosa was normal.  Rectal mucosa similarly was normal. Scope was retroflexed to examine anorectal junction which was unremarkable.  Endoscope was then withdrawn.  Withdrawal time was 22 minutes.  The patient tolerated the procedure well.  FINAL DIAGNOSES: 1.  Normal terminal ileum. 2. Pancolonic diverticulosis. 3. Four small polyps ablated via cold biopsy, one from cecum and few     were in transverse colon.  Polyps in transverse colon were     submitted in one container.  RECOMMENDATIONS: 1. Standard instructions given. 2. High-fiber diet plus fiber supplement 3-4 g daily.  I will be     contacting patient with results of biopsy and further     recommendations.     Hildred Laser, M.D.     NR/MEDQ  D:  05/04/2010  T:  05/04/2010  Job:  [id number]  cc:   Margaretmary Eddy, M.D. Fax: [phone]  Electronically Signed by Hildred Laser M.D. on 05/08/2010 01:59:11 PM

## 2010-05-13 ENCOUNTER — Encounter: Payer: Self-pay | Admitting: Cardiology

## 2010-05-13 LAB — CONVERTED CEMR LAB
Calcium: 9.3 mg/dL (ref 8.4–10.5)
Glucose, Bld: 243 mg/dL — ABNORMAL HIGH (ref 70–99)
Sodium: 137 meq/L (ref 135–145)

## 2010-05-19 ENCOUNTER — Ambulatory Visit (INDEPENDENT_AMBULATORY_CARE_PROVIDER_SITE_OTHER): Payer: Medicare Other

## 2010-05-19 ENCOUNTER — Encounter: Payer: Self-pay | Admitting: Cardiology

## 2010-05-19 DIAGNOSIS — I10 Essential (primary) hypertension: Secondary | ICD-10-CM

## 2010-05-23 NOTE — Letter (Signed)
Summary: [facility], general practice at [place]. [address] [place] [address]   Phone: [phone]  Fax: [phone]     May 19, 2010 MRN: [id number]   WADDELL ITEN [address] [place], [place]  [address]      YOUR LAB WORK IS DUE  March 19. 2012 _________________________________________  Please go to [facility], located across the street from [facility] on the second floor.  Hours are Monday - Friday 7am until 7:30pm         Saturday 8am until 12noon    _X_  DO NOT EAT OR DRINK AFTER MIDNIGHT EVENING PRIOR TO LABWORK  __ YOUR LABWORK IS NOT FASTING --YOU MAY EAT PRIOR TO LABWORK

## 2010-05-23 NOTE — Letter (Signed)
Summary: BP LOG  BP LOG   Imported By: [place] Crane 05/19/2010 14:35:28  _____________________________________________________________________  External Attachment:    Type:   Image     Comment:   External Document

## 2010-06-10 LAB — CONVERTED CEMR LAB
AST: 27 units/L (ref 0–37)
Indirect Bilirubin: 0.4 mg/dL (ref 0.0–0.9)
LDL Cholesterol: 30 mg/dL (ref 0–99)
Total Bilirubin: 0.5 mg/dL (ref 0.3–1.2)
Triglycerides: 345 mg/dL — ABNORMAL HIGH (ref <150)
VLDL: 69 mg/dL — ABNORMAL HIGH (ref 0–40)

## 2010-06-13 NOTE — Assessment & Plan Note (Signed)
Summary: 1 mth nurse visit per checkout on 04/18/10/tg  Medications Added AMLODIPINE BESYLATE 10 MG TABS (AMLODIPINE BESYLATE) take 1 tablet by mouth once daily      Allergies Added: NKDA  Visit Type:  Follow-up Primary Provider:  Dr. Rosemary Holms   History of Present Illness: S: Pt. arrives in office for a 1 month BP check with nurse. B: On last OV with Dr. Lattie Haw on 04-18-10 pt. was advised to start taking Amlodipine 94m once daily and increase Hydrochlorothiazide to 253monce daily  to better control BP. Also, Atorvastatin was increase to 8072mt bedtime due to borderline Lipid control. A: Pt. has no complaints at this time. He brought in his meds (he is taking as directed) and BP diary (scanned into chart). BP this morning is 146/63 and on last OV BP was 156/71. Pt. states he took his meds at around 6:00am this morning.  R: At pt's request and due to  increase in pt's Lipitor on last OV, Lipid/LFT's in one month  Pt. advised to continue his current medical regime and that we will call him with Dr. RotIzell Carolinacommendations, if any.  06/04/2010  BP control is improved and is relatively good, but not adequate for a patient with diabetes. Increase amlodipine to 10 mg q.d. Continue home blood pressure assessments RN visit for assessment of blood pressure in 2 months  RobJacqulyn Ducking.D.  Pt. advised, he states he understands instructions given. RX faxed to [facility] and BP check/nurse visit schedeuled for 08-07-10.  LynJeani Hawkinga LPN  March 12, [id number]        Current Medications (verified): 1)  Metoprolol Succinate 100 Mg Xr24h-Tab (Metoprolol Succinate) .... Take One Tablet By Mouth Daily 2)  Lipitor 80 Mg Tabs (Atorvastatin Calcium) .... Take One Tablet By Mouth Daily. 3)  Hydrochlorothiazide 25 Mg Tabs (Hydrochlorothiazide) .... Take 1 Tablet By Mouth Once A Day 4)  Verapamil Hcl Cr 240 Mg Cr-Tabs (Verapamil Hcl) .... Take 1 Tablet By Mouth Once A Day 5)  Janumet 50-1000 Mg  Tabs (Sitagliptin-Metformin Hcl) .... Take 1 Tab Two Times A Day 6)  Glyburide 5 Mg Tabs (Glyburide) .... Take 2 Tabs Two Times A Day 7)  Cialis 20 Mg Tabs (Tadalafil) .... Take As Needed 8)  Benazepril Hcl 40 Mg Tabs (Benazepril Hcl) .... Take 1 Tablet By Mouth Once A Day 9)  Cardura 8 Mg Tabs (Doxazosin Mesylate) .... Take 1 Tablet By Mouth Once A Day 10)  Amlodipine Besylate 5 Mg Tabs (Amlodipine Besylate) .... Take One Tablet By Mouth Daily  Allergies (verified): No Known Drug Allergies  Vital Signs:  Patient profile:   64 99ar old male Weight:      277 pounds Pulse rate:   64 / minute BP sitting:   146 / 63  (left arm)  Vitals Entered By: SanDoretha SouNA (May 19, 2010 9:12 AM)   Other Orders: Future Orders: T-Lipid Profile ([id number].. 06/12/2010 T-Hepatic Function ([phone].. 06/12/2010 Prescriptions: AMLODIPINE BESYLATE 10 MG TABS (AMLODIPINE BESYLATE) take 1 tablet by mouth once daily  #90 x 1   Entered by:   LynJeani Hawkinga LPN   Authorized by:   RobYehuda SavannahD, FACInstitute For Orthopedic SurgerySigned by:   LynJeani Hawkinga LPN on 03/48/54/6270Method used:   Faxed to ...       [facility]             , [place]  Ph: [phone]       Fax: [phone]   RxID:   [id number]

## 2010-06-20 ENCOUNTER — Telehealth: Payer: Self-pay | Admitting: Cardiology

## 2010-06-20 NOTE — Telephone Encounter (Signed)
PT IS CALLING FOR LAB RESULTS HE HAD DONE A COUPLE WEEKS AGO

## 2010-06-22 ENCOUNTER — Telehealth: Payer: Self-pay | Admitting: *Deleted

## 2010-06-22 DIAGNOSIS — I10 Essential (primary) hypertension: Secondary | ICD-10-CM

## 2010-06-22 MED ORDER — BENAZEPRIL HCL 40 MG PO TABS: 40.0000 mg | ORAL_TABLET | Freq: Every day | ORAL | Status: DC

## 2010-06-22 NOTE — Telephone Encounter (Signed)
Pt verbalized understanding of lab resutls

## 2010-06-22 NOTE — Telephone Encounter (Signed)
Called lab results to pt

## 2010-08-07 ENCOUNTER — Ambulatory Visit (INDEPENDENT_AMBULATORY_CARE_PROVIDER_SITE_OTHER): Payer: Medicare Other

## 2010-08-07 VITALS — BP 136/67 | HR 56 | Wt 278.0 lb

## 2010-08-07 DIAGNOSIS — I10 Essential (primary) hypertension: Secondary | ICD-10-CM

## 2010-08-07 NOTE — Progress Notes (Signed)
S: Pt. arrives in office for a 2 month BP check with nurse B: On last BP check/nurse visit on 06-13-10 pt. was advised to increase Amlodipine to 3m qd to better control BP (pt. is diabeteic) and to continue home BP's (copy of diary in Dr. RIzell Carolinafolder for review and copy sent to be scanned into pt's chart). A: Pt. has no complaints at this time. He states he "feels good' and has no c/o with increased dose of Amlodipine. His BP this morning is 139/62 and on last BP check BP was 146/63. Pt. brought in his medications and states he is taking as directed.  R: Pt. Advised to continue current medical treatment and that we will contact him with Dr. RIzell Carolinarecommendations, if any.  08/08/10 Blood pressure appears to be adequately controlled-continue current medication. RJacqulyn Ducking M.D.

## 2010-08-08 NOTE — Letter (Signed)
Aug 06, 2007    W. Rosemary Holms, M.D.  [address] [place], [place]  [address]   RE:  ISIAAH, CUERVO  MRN:  [id number]  /  DOB:  12-20-1945   Dear Richardson Landry,   Mr. Woon returns to the office for continued assessment and treatment  of exertional dyspnea.  Since starting furosemide, his symptoms have  resolved.  He noted excessive urination with the first week of therapy,  but now has returned towards normal.  His other medications are  unchanged.   PHYSICAL EXAMINATION:  GENERAL:  Pleasant gentleman in no acute  distress.  VITAL SIGNS:  The weight is 306, 9 pounds less than 5 weeks ago.  Blood  pressure 145/75, heart rate 55 and regular, respirations 14.  NECK:  No jugular venous distention; normal carotid upstrokes without  bruits.  LUNGS:  Clear.  CARDIAC:  Normal first and second heart sounds; minimal basilar systolic  murmur.  ABDOMEN:  Soft and nontender; no organomegaly.  EXTREMITIES:  No edema.   STUDIES:  Chest x-ray showed no acute abnormalities.  There was some  cardiac enlargement with minimal vascular redistribution.  Bronchitic  changes were noted.  BNP level was 253 with a D-dimer of 1.5.  Complete  chemistry profile was normal.  A repeat basic panel shows normal  electrolytes with potassium increasing to 4.9 and continued normal BUN  and creatinine.  His echocardiogram shows very mild aortic stenosis,  mild LVH and normal left ventricular systolic function.  On his EKG he  has prominent lateral Q-waves, which are new and markedly delayed R-wave  progression, which is old.   IMPRESSION:  Mr. Lex and does appear to have presented with  congestive heart failure/fluid retention.  His Actos may be the major  offender along with a component of diastolic dysfunction.  Blood  pressure control suboptimal.  We will stop Actos and suggest that he  increase metformin to 1000 mg b.i.d., following up with you for further  adjustment of his diabetic medications.   Lotensin will be increased to  40 mg daily and Cardura to 8 mg daily.  He will monitor blood pressure  at home.  Return to see the cardiology nurse in 1 month for blood  pressure check and plan a return office visit in 4 months.    Sincerely,      Cristopher Estimable. Lattie Haw, MD, [facility]  Electronically Signed    RMR/MedQ  DD: 08/06/2007  DT: 08/06/2007  Job #: [id number]

## 2010-08-08 NOTE — Letter (Signed)
June 27, 2007    W. Rosemary Holms, M.D.  [address] [place], [place] [address]   RE:  WINDSOR, GOEKEN  MRN:  [id number]  /  DOB:  1945/08/27   Dear Richardson Landry:   Mr. Mccalla is seen on an urgent basis at his request for dyspnea and  possible orthopnea.  He was recently evaluated in your office at which  time he described weight gain.  He did not tell you about any  respiratory disturbance.  Basic laboratory obtained at that time  including a lipid profile, hepatic profile, PSA and TSH were normal.  He  walks 2 miles per day and has been able to complete his routine, but has  experienced intermittent dyspnea.  He also notes dyspnea at night when  recumbent that improves when he sits up in a chair.  It is difficult to  determine whether this is true air hunger or just a sense that he needs  to take a deep breath.   As you know, this nice gentleman has been generally healthy.  He has  diabetes not requiring insulin therapy, hypertension and history of PSVT  that has been asymptomatic.   CURRENT MEDICATIONS:  1. Lotensin 20 mg daily.  2. Actos 45 mg daily.  3. HCTZ 25 mg daily.  4. Cardura 4 mg daily.  5. Toprol 100 mg daily.  6. Atorvastatin 80 mg daily.  7. Verapamil 240 mg b.i.d.  8. Glyburide 5 mg b.i.d.  9. Metformin 500 mg b.i.d.   PHYSICAL EXAMINATION:  GENERAL:  On exam, pleasant gentleman who appears  vigorous and in no acute distress.  VITAL SIGNS:  The weight is 315, 13  pounds more than last year.  Blood pressure 170/80, heart rate 70 with  prematures, respirations 16.  Oxygen saturation is normal.  NECK:  Mild-moderate jugular venous distention; no carotid bruits.  LUNGS:  Clear.  ABDOMEN:  Distended and perhaps somewhat dull to percussion, but I do  not definitely appreciate the presence of ascites.  No organomegaly; no  masses.  EXTREMITIES:  1/2+ pretibial edema; distal pulses intact.  NEUROLOGIC:  Symmetric strength and tone.  CARDIAC:  Normal first and  second heart sounds; fourth heart sound  present; normal PMI; no third heart sound.   DIAGNOSTICS:  EKG:  Sinus rhythm with PVCs; delayed R-wave progression -  cannot exclude prior anteroseptal myocardial infarction; nondiagnostic  lateral Q-waves; nonspecific T-wave abnormality.  When compared to a  prior tracing of September 07, 2004, poor R-wave progression is old.  The  lateral Q-waves are more prominent.   IMPRESSION:  Mr. Lienau presents with signs and symptoms that could  reflect congestive heart failure, but I am not totally convinced that is  the problem.  We will start furosemide 40 mg daily empirically and stop  his hydrochlorothiazide.  He will follow weights at home.  Initial work  evaluation will include a CBC, chemistry profile, BMP level, D-dimer,  chest x-ray and echocardiogram.  I will see this nice gentleman after  those tests have been completed.  We will recheck a chemistry profile in  2 weeks to monitor diuretic therapy.    Sincerely,      Cristopher Estimable. Lattie Haw, MD, [facility]  Electronically Signed    RMR/MedQ  DD: 06/27/2007  DT: 06/27/2007  Job #: [id number]

## 2010-08-08 NOTE — Letter (Signed)
August 26, 2006    W. Rosemary Holms, M.D.  [address] [place], [place] [address]   RE:  [name], [name]  MRN:  [id number]  /  DOB:  01/20/1946   Dear Richardson Landry,   John [name] returns after a long hiatus for continued assessment and  treatment  of PSVT and hypertension. He was last seen by Dr. Lovena Le in  2006 at which time a Holter monitor was pending. That study showed  generally durable heart rates with short nonsustained runs of SVT. Mr.  [name] was and remains asymptomatic from a cardiac standpoint. He  underwent left total knee arthroplasty without difficulty. Hypertension,  dyslipidemia and diabetes have been under good control with medical  therapy.   CURRENT MEDICATIONS:  1. Lotensin 20 mg daily.  2. Actos 45 mg daily.  3. HCTZ 25 mg daily.  4. Cardura 4 mg daily.  5. Niacin 500 mg daily.  6. Toprol 100 mg daily.  7. Verapamil 240 mg daily.  8. Atorvastatin 80 mg daily.  9. Glyburide/metformin 5/500 mg b.i.d.   PHYSICAL EXAMINATION:  GENERAL:  A pleasant gentleman in no acute  distress.  VITAL SIGNS:  The weight is 302, two pounds more than in June 2006.  Blood pressure initially measured as 155/80; subsequently 140/70 with a  large cuff. Heart rate 72 and regular, respirations 18.  NECK:  No jugular venous distention; normal carotid upstrokes without  bruits.  LUNGS:  Clear.  CARDIAC:  Normal first and second heart sounds; modest systolic ejection  murmur.  ABDOMEN:  Soft and nontender; no organomegaly.  EXTREMITIES:  Trace edema; distal pulses intact.   RHYTHM STRIP:  Sinus rhythm; fairly frequent PACs.   IMPRESSION:  John [name] is doing generally well. Blood pressure control  is adequate if not optimal. He does not seem to have had any significant  arrhythmias. We will increase his dose of Toprol to 150 mg as previously  planned and schedule this nice gentleman to return in 1 year. Monitoring  for hyperlipidemia and basic laboratory studies will be left to  your  discretion. Thanks so much for allowing [place] to continue in the care of  this nice gentleman. Prescriptions for metoprolol and verapamil were  rewritten.    Sincerely,      Cristopher Estimable. Lattie Haw, MD, [facility]  Electronically Signed    RMR/MedQ  DD: 08/26/2006  DT: 08/26/2006  Job #: [id number]

## 2010-08-08 NOTE — Letter (Signed)
December 09, 2007    W. Rosemary Holms, MD  [address] [place], [place] [address]   RE:  CLAUDE, WALDMAN  MRN:  [id number]  /  DOB:  August 30, 1945   Dear Richardson Landry,   Mr. Shinault returns to the office for continued assessment and treatment  of PSVT, hypertension, and hyperlipidemia in the setting of diabetes.  He is markedly improved since discontinuing Actos.  Pedal edema has  virtually resolved.  He reports no dyspnea.  He has lost a considerable  amount of weight.  He was recently seen in your office and told that his  hemoglobin A1c level is excellent at 6.8.  He is experiencing no  palpitations, nor does he have any other cardiopulmonary symptoms.   CURRENT MEDICATIONS:  1. Metoprolol 150 mg daily.  2. Metformin 1000 mg b.i.d.  3. Benazepril 20 mg b.i.d.  4. Glyburide 5 mg b.i.d.  5. Verapamil 240 mg b.i.d.  6. Atorvastatin 80 mg daily.  7. Doxazosin 8 mg daily.  8. Furosemide 40 mg daily.   PHYSICAL EXAMINATION:  GENERAL:  Very pleasant gentleman in no acute  distress.  VITALS:  The weight is 299, 1 pound more than June 2009, but 16 pounds  less than his weight in April.  Blood pressure 120/70, heart rate 65 and  regular, and respirations 14.  NECK:  No jugular venous distention; no carotid bruits.  LUNGS:  Clear.  CARDIAC:  Distant first and second heart sounds; modest systolic  ejection murmur.  ABDOMEN:  Soft and nontender; no organomegaly.  EXTREMITIES:  Ankle edema 1/2+.   IMPRESSION:  Mr. Rachal is doing beautifully with respect to  supraventricular tachycardia, hypertension, and dyslipidemia.  Lipid  profile was excellent just 6 months ago.  He will continue his current  medications, follow up with you for monitoring of laboratory studies,  and plan to see me again in 1 year.    Sincerely,      Cristopher Estimable. Lattie Haw, MD, [facility]  Electronically Signed    RMR/MedQ  DD: 12/09/2007  DT: 12/10/2007  Job #: [id number]

## 2010-08-09 ENCOUNTER — Telehealth: Payer: Self-pay | Admitting: *Deleted

## 2010-08-09 NOTE — Progress Notes (Signed)
Pt. Advised.

## 2010-08-11 ENCOUNTER — Encounter: Payer: Self-pay | Admitting: Cardiology

## 2010-08-11 NOTE — Procedures (Signed)
[facility]  Patient:    John [name], John [name] Visit Number: [id number] MRN: [id number]          Service Type: Attending:  Margaretmary Eddy, M.D. Dictated by:   Margaretmary Eddy, M.D. Proc. Date: 04/02/01                                Stress Test  PROCEDURE:  Stress test  PHYSICIAN:  Dr. Margaretmary Eddy  INDICATIONS:  The patient presents with multiple risk factors and is interested in being stratified for potential for asymptomatic ischemic heart disease.  He has a history of high blood pressure, elevated cholesterol, and type 2 diabetes mellitus.  He does not smoke.  There is a family history of coronary artery disease.  DESCRIPTION OF PROCEDURE:  The stress test was performed with the standard Bruce protocol.  A resting electrocardiogram revealed a normal sinus rhythm with no significant ST-T changes.  The patient tolerated the first stage well. During the second stage his heart rate continued to rise quickly.  He experienced no chest discomfort.  He had an elevated blood pressure response, as expected.  At his peak rate he reached a heart rate of 162, with the sub-maximum predicted heart rate of 140.  At the fastest heart rate, of which we have an electrocardiogram 162 beats per minute, there is really too much artifact to actually adequately read the electrocardiogram; however, a minute prior to this he had already surpassed the sub-maximum heart rate, with a current heart rate of 156.  At this point, at 0.08 seconds past the J-point, there was less than 1.0 mm ST depression in the leads.  In addition, those leads where the depression approached 1.0 mm the ST segment was ascending quickly.  IMPRESSION:  Negative adequate stress test.  PLAN:  The patient can continue to exercise.  Warning signs were discussed, as far as coronary ischemia.  The patient continue to aggressively manage his risk factors. Dictated by:   Margaretmary Eddy,  M.D. Attending:  Margaretmary Eddy, M.D. DD:  08/31/01 TD:  09/02/01 Job: [id number]

## 2010-08-11 NOTE — Op Note (Signed)
NAME:  John [name], John [name] NO.:  [id number]   MEDICAL RECORD NO.:  [id number]          PATIENT TYPE:  AMB   LOCATION:  DAY                           FACILITY:  [facility]   PHYSICIAN:  Carole Civil, M.D.DATE OF BIRTH:  Jul 27, 1945   DATE OF PROCEDURE:  06/13/2004  DATE OF DISCHARGE:                                 OPERATIVE REPORT   PREOPERATIVE DIAGNOSIS:  Osteoarthritis and torn meniscus of left knee.   POSTOPERATIVE DIAGNOSIS:  Osteoarthritis, torn medial meniscus, left knee.   INDICATIONS FOR PROCEDURE:  Pain.   ACTUAL PROCEDURE:  Arthroscopy, left knee; partial medial meniscectomy;  microfracture of medial femoral condyle; abrasion arthroplasty, tibial  plateau.   OPERATIVE FINDINGS:  Multiple osteophytes around the margins of the femur.  Chondromalacia of the patella.  Grade 3 lesion, medial femoral condyle.  Grade 4 lesion, medial tibial plateau.  Radial tear, medial meniscus  posterior horn.   SURGEON:  Carole Civil, M.D.  No assistants.   ANESTHETIC:  General by LMA.   DETAILS OF PROCEDURE:  John [name] was properly identified in the preop  holding area.  His history and physical was updated and his left knee was  signed as the surgical site and countersigned by the surgeon and antibiotics  were given.  He was taken to the operating room   In the operating room, he had satisfactory general anesthesia via LMA.   His left knee was prepped and draped using sterile technique.  At that  point, the time-out was completed as required.  All components of the time-  out were correct and we proceeded with a left knee arthroscopy on John [name].   We did a diagnostic arthroscopy through a two-incision technique.  We  completely viewed the entire knee and then addressed the pathology as  follows.  We used a straight duckbill forceps to resect the meniscal tear.  We then balanced the meniscus using a straight shaver.  We did a debridement  of the  medial femoral condyle until a stable rim was obtained, and then we  drilled several holes using a chondral pick, the so-called microfracture  technique.   We then did an abrasion arthroplasty on the tibial plateau.  We irrigated  the knee, suctioned the knee, cleaned of any debris and closed the portals  with Steri-Strips.  We injected 30 mL of Marcaine plain 0.5% and then  covered the knee was sterile dressings and a Cryo/Cuff and Ace bandage.  We  extubated the patient and took the patient to the recovery room in stable  condition.      SEH/MEDQ  D:  06/13/2004  T:  06/13/2004  Job:  [id number]

## 2010-08-11 NOTE — Procedures (Signed)
NAME:  John [name], John [name] NO.:  [id number]   MEDICAL RECORD NO.:  [id number]          PATIENT TYPE:  OUT   LOCATION:  RAD                           FACILITY:  [facility]   PHYSICIAN:  Jacqulyn Ducking, M.D.  DATE OF BIRTH:  01/15/1946   DATE OF PROCEDURE:  DATE OF DISCHARGE:                                  ECHOCARDIOGRAM   REFERRING PHYSICIAN:  W. Rosemary Holms, M.D. and Jacqulyn Ducking, M.D.   CLINICAL DATA:  This 65 year old gentleman with supraventricular  tachycardia, hypertension, and diabetes.  M-Mode aorta is 2.7, left atrium  4.6, septum 1.4, posterior wall 1.3.  LV diastole 5.0.  LV systole 3.6.   1.  Technically adequate echocardiographic study.  2.  Mild left and right atrial enlargement.  Mild right ventricular      dilatation with normal function.  3.  Mild to moderate aortic sclerosis.  Normal valve function.  4.  Normal tricuspid valve.  No regurgitation.  5.  Normal pulmonic valve and proximal pulmonary artery.  6.  Normal mitral valve.  Mild annular calcification.  Mild regurgitation.  7.  Left ventricular size at the upper limit of normal.  Borderline      hypertrophy.  Normal regional and global LV systolic function.  8.  Incidentally noted throughout much of the study was supraventricular      tachycardia at a rate of approximately 130 beats/minute.      RR/MEDQ  D:  05/17/2004  T:  05/18/2004  Job:  [id number]

## 2010-08-11 NOTE — Op Note (Signed)
NAME:  John [name], John [name] NO.:  [id number]   MEDICAL RECORD NO.:  [id number]          PATIENT TYPE:  AMB   LOCATION:  DAY                           FACILITY:  [facility]   PHYSICIAN:  Hildred Laser, M.D.    DATE OF BIRTH:  [birth year]   DATE OF PROCEDURE:  11/21/2004  DATE OF DISCHARGE:                                 OPERATIVE REPORT   PROCEDURE:  Colonoscopy.   ENDOSCOPIST:  Hildred Laser, M.D.   INDICATIONS:  This is a 65 year old [place] American male who is here for  screening colonoscopy.  He was recently treated for diverticulitis and  improved with antibiotic therapy.  He presently has no complaints.  Family  history is negative for colorectal carcinoma.  Procedures were reviewed with  the patient and informed consent was obtained.   PREOPERATIVE MEDICATION:  Demerol 50 mg IV, Versed 6 mg IV.   FINDINGS:  Procedure performed in endoscopy suite.  The patient's vital  signs and O2 saturation were monitored during the procedure and remained  stable. The patient was placed in the left lateral position and rectal  examination performed.  No abnormality noted on external or digital exam.  The Olympus videoscope was placed in the rectum and advanced under vision  into the sigmoid colon and beyond.  Preparation was satisfactory.  He had  multiple diverticula in the sigmoid colon, some of which were moderate to  large.  There were diverticula scattered through the rest of the colon, but  they were smaller in size.  The scope was advanced to the cecum which was  identified by ileocecal valve and appendiceal orifice.  Pictures taken for  the record.  As the scope was withdrawn, the colonic mucosa was carefully  examined. There was a small polyp with erythema on the site which was  ablated via cold biopsy and suspicious for an inflammatory polyp.  There was  another submucosal lesion about 5 mm in diameter at the sigmoid colon,  either a lipoma or a small leiomyoma.  This  was left alone. There was a  small polyp at rectosigmoid junction which was ablated via cold biopsy.  Mucosa of the rest of the rectum was normal.  The scope was retroflexed to  examine anorectal junction which was unremarkable.  Endoscope was then  withdrawn.  The patient tolerated the procedure well.   FINAL DIAGNOSIS:  1.  Pan colonic diverticulosis.  Most of the diverticula are clear in the      sigmoid colon.  2.  A 5 mm submucosal lesion at sigmoid colon, possibly lipoma or leiomyoma      which was left alone.  3.  Two small polyps ablated via cold biopsy, one at sigmoid colon.  The      second one was at rectosigmoid.   RECOMMENDATIONS:  1.  High-fiber diet.  2.  Fiber Choice chew two tablets every day.  3.  I will be contacting the patient with biopsy results and further      recommendations.      Hildred Laser, M.D.  Electronically Signed  NR/MEDQ  D:  11/21/2004  T:  11/21/2004  Job:  [id number]

## 2010-08-11 NOTE — H&P (Signed)
NAME:  John [name], John [name] NO.:  [id number]   MEDICAL RECORD NO.:  [id number]          PATIENT TYPE:  AMB   LOCATION:  DAY                           FACILITY:  [facility]   PHYSICIAN:  Carole Civil, M.D.DATE OF BIRTH:  1945/11/12   DATE OF ADMISSION:  DATE OF DISCHARGE:  LH                                HISTORY & PHYSICAL   CHIEF COMPLAINT:  Left knee pain.   HISTORY:  This is a 65 year old male who fell two weeks ago. He injured his  left knee. Because of pain and swelling, he sought medical attention through  his primary care doctor, Dr. Wolfgang Phoenix. Based on his examination, he was sent  for MRI. The MRI shows multiple injuries including osteoarthritis, acute  tear of the medial meniscus, partial tear of the anterior cruciate ligament,  strain patterns of the collateral ligament.   The patient is limping. He has lost motion in the knee. He has severe pain  and a significant effusion. He presents for a medial meniscectomy and other  treatments as needed to improve his knee function.   He was given informed consent. It has been explained to him the possible  risks and benefits of the procedure including doing nothing.   His past, family, and social history and review of systems are significant  for the following:  He has diabetes. His review of systems otherwise normal.  He does have a back problem. He has had six back surgeries. He is currently  out of work based on a worker's compensation injury.   ALLERGIES:  He has no allergies.   MEDICATIONS:  He takes glyburide, Actos, Lotensin, Procardia, and Cardura.   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  He has a Medical laboratory scientific officer. He is married.   PHYSICAL EXAMINATION:  Weight 285, pulse 80, respiratory rate 18. Appearance  normal. He is mesomorphic in body habitus.   He was awake, alert, and oriented x3. His neurological exam showed normal  sensation.  He had good  pulses in his extremities. His skin was normal including his  foot care.   His upper extremities showed no contracture, subluxation, atrophy, tremor,  or malalignment.   His left lower extremity had abnormal range of motion approximately 40  degrees with pain throughout the range of motion. He had normal strength and  muscle tone. His stability could not be tested based on the swelling and  pain. He did have a lot of tenderness, swelling, and joint effusion. His  opposite extremity was normal.   The MRI findings are listed above. I have reviewed those.   DIAGNOSES:  Torn medial meniscus, left knee. Partial anterior cruciate  ligament tear. Strain pattern, medial and lateral collateral ligaments.  Tibial contusion or bone bruise medially and osteoarthritis, patellofemoral  joint, and medial compartment.   PLAN:  Arthroscopy, left knee, medial meniscectomy. Followup scheduled for  two days after surgery. To be dispensed Lorcet Plus for postoperative pain.      SEH/MEDQ  D:  05/08/2004  T:  05/08/2004  Job:  [id number]  cc:   [facility]  Fax: [phone]

## 2010-08-11 NOTE — H&P (Signed)
NAME:  John [name], John [name] NO.:  [id number]   MEDICAL RECORD NO.:  [id number]          PATIENT TYPE:  AMB   LOCATION:  DAY                           FACILITY:  [facility]   PHYSICIAN:  Carole Civil, M.D.DATE OF BIRTH:  [birth year]   DATE OF ADMISSION:  DATE OF DISCHARGE:  LH                                HISTORY & PHYSICAL   CHIEF COMPLAINT:  Left knee pain.   HISTORY:  This is a 65 year old male who fell February 2006, was scheduled  for surgery, had some cardiac arrhythmias, was worked up by Dr. Lattie Haw,  and has since been cleared for surgery.  Has a MRI which shows  osteoarthritis, medial meniscal tear, anterior cruciate ligament tear  partial, strain patterns of the collateral ligaments.  He has a severe limp  and loss of motion since the fall.  He has an effusion.   He gave informed consent for a left knee arthroscopy.   His past, family, social history, and review of systems are significant for  the following:  He has diabetes, history of a back problem with six back  surgeries.  Currently our of work from a [employer] injury.   ALLERGIES:  No known drug allergies.   MEDICATIONS:  1.  Glyburide.  2.  Actos.  3.  Lotensin.  4.  Procardia.  5.  Cardura.   OCCUPATION:  Designer, industrial/product.   SOCIAL HABITS:  None.   SOCIAL HISTORY:  He is married.   PHYSICAL EXAMINATION:  VITAL SIGNS:  Weight 285, pulse 80, respiratory rate  20.  GENERAL:  His appearance is normal.  He is awake, alert, and oriented x3.  His mood and affect are normal.  NEUROLOGIC:  Normal findings in his upper extremities and his left lower  extremity.  He has good pulses.  SKIN:  Normal.  EXTREMITIES:  Upper extremities:  No contractures, subluxation, atrophy,  tremor, or malalignment.  Left lower extremity with decreased range of  motion of 0 to 70, normal strength and muscle tone, stability is intact in  terms of the ligaments.  He does have a joint effusion with  swelling and  medial compartment tenderness.   LABORATORY DATA:  MRI as stated.   DIAGNOSES:  1.  Torn medial meniscus, left knee.  2.  Partial anterior cruciate ligament tear.  3.  Strain medial and lateral collateral ligaments.  4.  Osteoarthritis, patellofemoral and medial compartments.   PLAN:  Arthroscopy, left knee, with medial meniscectomy.      SEH/MEDQ  D:  06/12/2004  T:  06/12/2004  Job:  [id number]

## 2010-08-11 NOTE — H&P (Signed)
NAME:  John [name], WAKELEY NO.:  [id number]   MEDICAL RECORD NO.:  [id number]                   PATIENT TYPE:  AMB   LOCATION:  DAY                                  [facility]ACILITY:  [facility]   PHYSICIAN:  Susa Day, M.D.                 DATE OF BIRTH:  [birth year]   DATE OF ADMISSION:  09/30/2002  DATE OF DISCHARGE:                                HISTORY & PHYSICAL   CHIEF COMPLAINT:  Low back pain with radicular pain in bilateral lower  extremities.   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  The patient was initially evaluated  by his family physician, W. Rosemary Holms, M.D. where an MRI was ordered  which showed multilevel lateral recess stenosis and disk protrusion at L5-  S1.  The patient was then referred to our office for evaluation.  Initially,  the patient was treated with epidural steroid injections.  He received a  series of two which initially seemed to help his pain quite a bit.  However,  he did note some residual right lower extremity numbness.  The patient  continued to take Neurontin and Ultram throughout the course but the patient  returned in June of 2004 with increasing symptoms.  He noted increasing pain  in both his lower extremities with numbness right greater than left.  The  patient had a positive straight leg raise bilaterally that produces  buttocks, posterior thigh, and calf pain which is exacerbated with dorsal  augmentation maneuver.  He had decreased sensation along the outer aspect  left foot as compared to the right with decreased Achilles reflex in the  right.  In reviewing the MRI again there is a large central disk herniation  at L5-S1 with neuroforaminal stenosis at L5 on the left and right.  Due to  the fact the patient had failed conservative treatment, it is felt at this  point he would benefit from a lumbar decompression.  Risks and benefits of  the surgery were discussed with him in detail and he wished to proceed.   PAST MEDICAL HISTORY:  1. Non-insulin-dependent diabetes.  2. Hypertension.  3. Hypercholesterolemia.   CURRENT MEDICATIONS:  1. Glucovance 5/500 two p.o. b.i.d.  2. Actos 45 mg one p.o. daily.  3. Lipitor 80 mg one p.o. daily.  4. Lotensin 20 mg one p.o. daily.  5. Procardia XL 90 mg one p.o. daily.  6. Cardura 4 mg one p.o. daily.  7. HCTZ 25 mg one p.o. daily.  8. Lodine 400 mg p.r.n.  9. Neurontin 300 mg one p.o. t.i.d.  10.      Ultram p.r.n.   PAST SURGICAL HISTORY:  Multiple lumbar spine surgeries, approximately five  done by neurosurgeon.   SOCIAL  HISTORY:  The patient is married.  He denies any tobacco and  occasional alcohol consumption.  His wife will be his care giver following  surgery.  They live in a two-story home.   FAMILY HISTORY:  Mother has coronary artery disease, diabetes, hypertension,  however, is alive and well at age 61.   REVIEW OF SYSTEMS:  GENERAL:  The patient denies any fever, chills, night  sweats, or bleeding tendencies.  CNS:  No blurred/double vision, seizure,  headache, or paralysis.  RESPIRATORY:  No shortness of breath, productive  cough, or hemoptysis.  CARDIOVASCULAR:  No chest pain, angina, orthopnea.  GENITOURINARY:  No dysuria, hematuria, discharge.  GASTROINTESTINAL:  No  nausea, vomiting, constipation, melena, or bloody stools.  MUSCULOSKELETAL:  Pertinent to HPI.   PHYSICAL EXAMINATION:  VITAL SIGNS:  Pulse 96, respirations 16, blood  pressure 150/88.  GENERAL:  This is a well-developed, well-nourished 65 year old gentleman in  moderate distress.  He does walk with antalgic gait.  HEENT:  Normocephalic, atraumatic.  Pupils are equal, round, and reactive to  light.  EOMs intact.  NECK:  Supple.  No lymphadenopathy.  CHEST:  Clear to auscultation bilaterally.  No rhonchi, wheezes,  or rales.  HEART:  Regular rate and rhythm without murmur, rub, or gallop.  ABDOMEN:  Soft, nontender, nondistended.  Bowel sounds x4.  BREASTS:  Not done.  Not pertinent to HPI.  GENITOURINARY:  Not done.  Not pertinent to HPI.  SKIN:  No rashes or lesions are noted.  BACK:  Lumbar spine:  The patient has straight leg raise bilaterally that  produces posterior buttock, thigh, and calf pain which is exacerbated with  dorsal augmentation maneuver.  He has decreased sensation on the outer  aspect left foot as compared to the right with decreased Achilles on the  right.  Quadriceps is 5/5.   LABORATORIES:  X-rays reveal moderate to large central disk herniation at L5-  S1 with neural foraminal stenosis L5 on the left and right, also a broad  based protrusion at L3-4 with no severe central stenosis.   IMPRESSION:  1. Herniated nucleus pulposis L5-S1.  2. Non-insulin-dependent diabetes.  3. Hypertension.  4. Hypercholesterolemia.   PLAN:  The patient will be admitted to [facility] to undergo a  lumbar decompression L5-S1 by Susa Day, M.D.     Rometta Emery, P.A.                   Susa Day, M.D.    CS/MEDQ  D:  09/21/2002  T:  09/21/2002  Job:  [id number]

## 2010-08-11 NOTE — Op Note (Signed)
NAME:  John [name], ISHIDA NO.:  [id number]   MEDICAL RECORD NO.:  [id number]                   PATIENT TYPE:  AMB   LOCATION:  DAY                                  FACILITY:  [facility]   PHYSICIAN:  Susa Day, M.D.                 DATE OF BIRTH:  07-Oct-1945   DATE OF PROCEDURE:  09/30/2002  DATE OF DISCHARGE:                                 OPERATIVE REPORT   PREOPERATIVE DIAGNOSES:  Spinal stenosis, recurrent disk herniation,  herniated nucleus pulposus L5-S1.   POSTOPERATIVE DIAGNOSES:  Spinal stenosis, recurrent disk herniation,  herniated nucleus pulposus L5-S1.   PROCEDURE PERFORMED:  Bilateral redo hemilaminotomy, microdiskectomy,  foraminotomies of L5 and S1 with decompression of the L5 and the S1 nerve  roots.   ANESTHESIA:  General.   ASSISTANT:  Rometta Emery, P.A.   BRIEF HISTORY AND INDICATION:  A 65 year old with refractory lower extremity  radicular pain in the S1 nerve root distribution, HNP centrally  noted at 5-  1, progressive disk degeneration.  He had a recurrent disk, epidural  fibrosis.  Operative intervention is indicated for decompression of the S1  nerve roots bilaterally.  He had a noncompressed disk at 3-4.  Neural  foraminal narrowing was noted at 5.  Risks and benefits discussed, including  bleeding, infection, damage to vascular structures, CSF leak, epidural  fibrosis, adjacent segment disease, need for fusion in the future, etc.   The patient was placed in the supine position.  After the induction of  adequate general anesthesia and 2 g of Kefzol, he was placed prone on the  Varina frame, all bony prominences were well-padded, and the lumbar region  was prepped and draped in the usual sterile fashion.  An incision was made  over 5-1 interspace.  We incised the previous scar.  The subcutaneous tissue  was dissected and electrocautery utilized to achieve hemostasis.  The dorsal  lumbar fascia identified and  divided along the line of the skin incision.  The paraspinous muscles elevated from the lamina of 5 and S1.  Another  radiograph is obtained in the 5-1 interspace.  Using a curette to mobilize  the scar tissue from the laminae of 5 and S1 bilaterally, this was then  removed with the Leksell rongeur, electrocautery was utilized to achieve  strict hemostasis.  We delineated the previous hemilaminotomies.  This was  mainly on the left.  Then performed a foraminotomy of S1 on the left and  then a hemilaminotomy of the caudad edge of 5.  We draped the operative  microscope and brought it into the surgical field.  Under direct  visualization we then mobilized the thecal sac medially and performed a  foraminotomy at 5 with the 2 mm Kerrison, protecting the neural elements at  all times.  With a combination of mobilization with the Penfield 4 and a  small curette, we identified the  disk space of the pedicle medial border.  Significant stenosis noted at 5, compression of the S1 nerve roots and the  lateral recess.  Identified the disk space, entered it with a Penfield 4,  and then sequentially performed a diskectomy with pituitaries and across the  midline as well.  There was still tension on the nerve roots noted, and we  removed the contralateral side in a similar fashion and performed  foraminotomies of S1 and 5, detached the ligamentum flavum from the  interspace, gently mobilized the thecal sac medially, and found a large HNP  that was centrally and to the right.  Here annulotomy was performed, copious  portions of disk material was removed from the disk space in the subannular  space.  We further mobilized it with a nerve hook, a hockey stick, and  Epstein, performing a thorough diskectomy, then once on the contralateral  side and retrieving an additional piece at 5.  After the thorough diskectomy  and the foraminotomies, the nerve roots bilaterally had excursion of at  least a centimeter  medial to the pedicle without compression.  The neural  foramina were undercut at L5.  A hockey stick probe placed in the foramen of  5 and S1 and found to be widely patent.  The disk space copiously irrigated,  bipolar cautery was utilized to achieve hemostasis.  We performed Valsalva  and there is no evidence of CSF leakage.   The disk space is copiously irrigated once again and bipolar cautery is  utilized to achieve hemostasis.  We then removed the microscope, inspected  the paraspinous muscles, and no evidence of active bleeding.  Thrombin-  soaked Gelfoam was placed on the laminotomy defects, and we used bone wax as  well.  A Hemovac was placed and brought out through a lateral stab wound in  the skin.  We approximated the paraspinous muscles in the dorsal lumbar  fascia with #1 Vicryl figure-of-eight suture, the subcutaneous tissue  reapproximated with 2-0 Vicryl simple sutures, the skin was reapproximated  with staples and the wound dressed sterilely.  The patient was placed supine  on the hospital bed, extubated without difficulty, and was transported to  the recovery room in satisfactory condition.  The patient tolerated the  procedure well with no complications.                                               Susa Day, M.D.    Geralynn Rile  D:  09/30/2002  T:  09/30/2002  Job:  [id number]

## 2010-08-30 ENCOUNTER — Encounter: Payer: Self-pay | Admitting: Cardiology

## 2010-09-14 ENCOUNTER — Other Ambulatory Visit: Payer: Self-pay | Admitting: Cardiology

## 2010-09-14 MED ORDER — ATORVASTATIN CALCIUM 80 MG PO TABS: 80.0000 mg | ORAL_TABLET | Freq: Every day | ORAL | Status: DC

## 2010-09-14 NOTE — Telephone Encounter (Signed)
PT CALLED FOR REFILL ON LIPITOR 80 MG FOR QTY:90 DAY SUPPLY.

## 2010-09-15 ENCOUNTER — Other Ambulatory Visit: Payer: Self-pay | Admitting: *Deleted

## 2010-09-15 MED ORDER — ATORVASTATIN CALCIUM 80 MG PO TABS: 80.0000 mg | ORAL_TABLET | Freq: Every day | ORAL | Status: DC

## 2010-09-15 NOTE — Telephone Encounter (Signed)
Addended by: Johnnette Barrios on: 09/15/2010 04:39 PM   Modules accepted: Orders

## 2010-09-29 ENCOUNTER — Other Ambulatory Visit: Payer: Self-pay | Admitting: Cardiology

## 2010-10-04 ENCOUNTER — Telehealth: Payer: Self-pay | Admitting: Cardiology

## 2010-10-04 ENCOUNTER — Other Ambulatory Visit: Payer: Self-pay | Admitting: *Deleted

## 2010-10-04 MED ORDER — HYDROCHLOROTHIAZIDE 25 MG PO TABS: 25.0000 mg | ORAL_TABLET | Freq: Every day | ORAL | Status: DC

## 2010-10-04 NOTE — Telephone Encounter (Signed)
Needs 90 day supply of HCTZ 29m sent to [facility]/ tg

## 2010-11-03 ENCOUNTER — Other Ambulatory Visit: Payer: Self-pay | Admitting: Cardiology

## 2010-11-10 ENCOUNTER — Other Ambulatory Visit: Payer: Self-pay | Admitting: Cardiology

## 2010-11-20 ENCOUNTER — Ambulatory Visit (INDEPENDENT_AMBULATORY_CARE_PROVIDER_SITE_OTHER): Payer: Medicare Other | Admitting: Cardiology

## 2010-11-20 ENCOUNTER — Encounter: Payer: Self-pay | Admitting: *Deleted

## 2010-11-20 ENCOUNTER — Encounter: Payer: Self-pay | Admitting: Cardiology

## 2010-11-20 DIAGNOSIS — I10 Essential (primary) hypertension: Secondary | ICD-10-CM

## 2010-11-20 DIAGNOSIS — I471 Supraventricular tachycardia: Secondary | ICD-10-CM

## 2010-11-20 DIAGNOSIS — M1990 Unspecified osteoarthritis, unspecified site: Secondary | ICD-10-CM

## 2010-11-20 DIAGNOSIS — E785 Hyperlipidemia, unspecified: Secondary | ICD-10-CM

## 2010-11-20 DIAGNOSIS — E119 Type 2 diabetes mellitus without complications: Secondary | ICD-10-CM

## 2010-11-20 MED ORDER — CLONIDINE HCL 0.1 MG/24HR TD PTWK: 1.0000 | MEDICATED_PATCH | TRANSDERMAL | Status: DC

## 2010-11-20 MED ORDER — CLONIDINE HCL 0.2 MG/24HR TD PTWK: 1.0000 | MEDICATED_PATCH | TRANSDERMAL | Status: DC

## 2010-11-20 MED ORDER — HYDROCHLOROTHIAZIDE 12.5 MG PO CAPS: 12.5000 mg | ORAL_CAPSULE | Freq: Every day | ORAL | Status: DC

## 2010-11-20 MED ORDER — AMLODIPINE BESYLATE 10 MG PO TABS: 10.0000 mg | ORAL_TABLET | Freq: Every day | ORAL | Status: DC

## 2010-11-20 NOTE — Assessment & Plan Note (Addendum)
No recent laboratory studies available for my review; records will be sought from Dr. Wolfgang Phoenix.  Hemoglobin A1c was 6.8 in 2009, but available blood glucose levels at least moderately elevated in the interim.  Complete metabolic profile was good in 05/2010 as was lipid profile except for an elevated triglyceride level.

## 2010-11-20 NOTE — Progress Notes (Signed)
HPI :  John [name] is seen at his request for dizziness, which he attributes to his medications, particularly amlodipine and lisinopril.  He experiences lightheadedness within minutes after taking these pills, typically after arising from a sitting position.  Symptoms last for a matter of seconds and clear spontaneously.   No falls nor apparent loss of consciousness have occurred.  He has no orthopnea, PND, chest discomfort or dyspnea.  He has tried to distribute his multiple antihypertensive medications over the course of the day with some improvement in his symptoms.  Current Outpatient Prescriptions on File Prior to Visit  Medication Sig Dispense Refill  . atorvastatin (LIPITOR) 80 MG tablet Take 1 tablet (80 mg total) by mouth daily.  90 tablet  1  . benazepril (LOTENSIN) 40 MG tablet Take 1 tablet (40 mg total) by mouth daily.  90 tablet  1  . doxazosin (CARDURA) 8 MG tablet TAKE 1 TABLET DAILY  90 tablet  1  . glyBURIDE (DIABETA) 5 MG tablet Take 5 mg by mouth 2 (two) times daily with a meal.        . metoprolol (TOPROL-XL) 100 MG 24 hr tablet TAKE 1 TABLET DAILY  90 tablet  1  . sitaGLIPtan-metformin (JANUMET) 50-500 MG per tablet Take 1 tablet by mouth 2 (two) times daily with a meal.        . verapamil (COVERA HS) 240 MG (CO) 24 hr tablet Take 240 mg by mouth at bedtime.        Marland Kitchen amLODipine (NORVASC) 5 MG tablet Take 5 mg by mouth 2 (two) times daily.        . tadalafil (CIALIS) 20 MG tablet Take 20 mg by mouth daily as needed.           No Known Allergies    Past medical history, social history, and family history reviewed and updated.  ROS: See history of present illness.  PHYSICAL EXAM: BP 122/62  Pulse 53  Resp 18  Ht 6' (1.829 m)  Wt 274 lb (124.286 kg)  BMI 37.16 kg/m2  SpO2 97%  General-Well developed; no acute distress Body habitus-proportionate weight and height Neck-No JVD; no carotid bruits Lungs-clear lung fields we've decreased breath sounds at the bases; resonant  to percussion Cardiovascular-normal PMI; normal S1 and S2; grade 1/6 systolic ejection murmur at the upper right sternal border; infrequent irregularity in rhythm Abdomen-normal bowel sounds; soft and non-tender without masses or organomegaly Musculoskeletal-No deformities, no cyanosis or clubbing Neurologic-Normal cranial nerves; symmetric strength and tone Skin-Warm, no significant lesions Extremities-bounding distal pulses; 1/2+ edema  ASSESSMENT AND PLAN:

## 2010-11-20 NOTE — Patient Instructions (Signed)
   Confirm medications on list - call if incorrect.  Decrease HCTZ to 12.74m daily, can take 1/2 of the 272mtab till finished with current supply  Monitor home blood pressure - maintain log  blood pressure check with nurse in 3 months Your physician wants you to follow up in:  1 year.  You will receive a reminder letter in the mail one-two months in advance.  If you don't receive a letter, please call our office to schedule the follow up appointment

## 2010-11-20 NOTE — Assessment & Plan Note (Signed)
Hyperlipidemia is adequately controlled except for an elevated triglyceride level, which likely would improve with better control of diabetes.  Dr. Wolfgang Phoenix is managing medications for treatment of that disorder.

## 2010-11-20 NOTE — Assessment & Plan Note (Signed)
Blood pressure control has generally been good according to values reported by the patient and blood pressures obtained in the office today without significant orthostatic changes.  Nonetheless, his symptoms could be caused by intermittent excessive lowering of blood pressure.  Hydrochlorothiazide dosage will be reduced to 12.5 mg per day with patient continuing to monitor home blood pressures.  I will reassess this nice gentleman in one year.  Laboratory tests will be monitored in the interim and a blood pressure check performed in a few months.

## 2010-12-04 ENCOUNTER — Telehealth: Payer: Self-pay | Admitting: Cardiology

## 2010-12-04 MED ORDER — GLYBURIDE 5 MG PO TABS: 5.0000 mg | ORAL_TABLET | Freq: Two times a day (BID) | ORAL | Status: DC

## 2010-12-04 NOTE — Telephone Encounter (Signed)
Patient wants to know if Dr.Rothbart can okay him a refill for about 20 pills on his Glyburide.  States that he only has 1 pill left and his PCP is out of town until Thursday. If this is okay, can you call it in to [facility] here in [place]?  / tg

## 2010-12-19 LAB — COMPREHENSIVE METABOLIC PANEL
ALT: 26
AST: 22
Alkaline Phosphatase: 52
GFR calc non Af Amer: >60
Potassium: 3.6
Sodium: 142

## 2010-12-19 LAB — B-NATRIURETIC PEPTIDE (CONVERTED LAB): Pro B Natriuretic peptide (BNP): 253 — ABNORMAL HIGH

## 2010-12-19 LAB — DIFFERENTIAL
Eosinophils Relative: 2
Lymphocytes Relative: 31
Lymphs Abs: 2.4
Monocytes Relative: 9
Neutrophils Relative %: 57

## 2010-12-19 LAB — CBC
MCHC: 33.5
Platelets: 119 — ABNORMAL LOW
RBC: 4.82
RDW: 15.2
WBC: 7.8

## 2010-12-25 DIAGNOSIS — I4892 Unspecified atrial flutter: Secondary | ICD-10-CM

## 2010-12-25 HISTORY — DX: Unspecified atrial flutter: I48.92

## 2010-12-28 ENCOUNTER — Other Ambulatory Visit: Payer: Self-pay | Admitting: *Deleted

## 2010-12-28 DIAGNOSIS — I10 Essential (primary) hypertension: Secondary | ICD-10-CM

## 2010-12-28 MED ORDER — BENAZEPRIL HCL 40 MG PO TABS: 40.0000 mg | ORAL_TABLET | Freq: Every day | ORAL | Status: DC

## 2010-12-28 MED ORDER — VERAPAMIL HCL 240 MG (CO) PO TB24: 240.0000 mg | ORAL_TABLET | Freq: Every day | ORAL | Status: DC

## 2010-12-28 MED ORDER — AMLODIPINE BESYLATE 10 MG PO TABS: 10.0000 mg | ORAL_TABLET | Freq: Every day | ORAL | Status: DC

## 2011-01-05 ENCOUNTER — Inpatient Hospital Stay (HOSPITAL_COMMUNITY): Payer: Medicare Other

## 2011-01-05 ENCOUNTER — Inpatient Hospital Stay (HOSPITAL_COMMUNITY)
Admission: AD | Admit: 2011-01-05 | Discharge: 2011-01-10 | DRG: 308 | Disposition: A | Discharge status: Home / Self Care | Payer: Medicare Other | Source: Ambulatory Visit | Attending: Cardiology | Admitting: Cardiology

## 2011-01-05 ENCOUNTER — Encounter: Payer: Self-pay | Admitting: Adult Health

## 2011-01-05 ENCOUNTER — Ambulatory Visit (INDEPENDENT_AMBULATORY_CARE_PROVIDER_SITE_OTHER): Payer: Medicare Other | Admitting: Adult Health

## 2011-01-05 DIAGNOSIS — I509 Heart failure, unspecified: Secondary | ICD-10-CM | POA: Diagnosis present

## 2011-01-05 DIAGNOSIS — I10 Essential (primary) hypertension: Secondary | ICD-10-CM

## 2011-01-05 DIAGNOSIS — I252 Old myocardial infarction: Secondary | ICD-10-CM

## 2011-01-05 DIAGNOSIS — E119 Type 2 diabetes mellitus without complications: Secondary | ICD-10-CM | POA: Diagnosis present

## 2011-01-05 DIAGNOSIS — I4892 Unspecified atrial flutter: Principal | ICD-10-CM | POA: Diagnosis present

## 2011-01-05 DIAGNOSIS — M1990 Unspecified osteoarthritis, unspecified site: Secondary | ICD-10-CM | POA: Diagnosis present

## 2011-01-05 DIAGNOSIS — I5043 Acute on chronic combined systolic (congestive) and diastolic (congestive) heart failure: Secondary | ICD-10-CM | POA: Diagnosis present

## 2011-01-05 DIAGNOSIS — Z79899 Other long term (current) drug therapy: Secondary | ICD-10-CM

## 2011-01-05 DIAGNOSIS — I4891 Unspecified atrial fibrillation: Secondary | ICD-10-CM | POA: Diagnosis present

## 2011-01-05 DIAGNOSIS — E785 Hyperlipidemia, unspecified: Secondary | ICD-10-CM | POA: Diagnosis present

## 2011-01-05 DIAGNOSIS — E669 Obesity, unspecified: Secondary | ICD-10-CM | POA: Diagnosis present

## 2011-01-05 DIAGNOSIS — Z7901 Long term (current) use of anticoagulants: Secondary | ICD-10-CM

## 2011-01-05 DIAGNOSIS — I498 Other specified cardiac arrhythmias: Secondary | ICD-10-CM | POA: Diagnosis present

## 2011-01-05 LAB — COMPREHENSIVE METABOLIC PANEL
ALT: 21 U/L (ref 0–53)
Alkaline Phosphatase: 52 U/L (ref 39–117)
GFR calc Af Amer: >90 mL/min (ref >90)
GFR calc non Af Amer: 89 mL/min — ABNORMAL LOW (ref >90)
Total Bilirubin: 0.7 mg/dL (ref 0.3–1.2)
Total Protein: 6.5 g/dL (ref 6.0–8.3)

## 2011-01-05 LAB — APTT: aPTT: 31 seconds (ref 24–37)

## 2011-01-05 LAB — CBC
HCT: 37.5 % — ABNORMAL LOW (ref 39.0–52.0)
Platelets: 133 10*3/uL — ABNORMAL LOW (ref 150–400)
RDW: 14.7 % (ref 11.5–15.5)
WBC: 9.4 10*3/uL (ref 4.0–10.5)

## 2011-01-05 LAB — GLUCOSE, CAPILLARY

## 2011-01-05 LAB — HEMOGLOBIN A1C
Hgb A1c MFr Bld: 8.4 % — ABNORMAL HIGH (ref <5.7)
Mean Plasma Glucose: 194 mg/dL — ABNORMAL HIGH (ref <117)

## 2011-01-05 LAB — PROTIME-INR: INR: 1.07 (ref 0.00–1.49)

## 2011-01-05 LAB — MAGNESIUM: Magnesium: 1.9 mg/dL (ref 1.5–2.5)

## 2011-01-05 LAB — TSH: TSH: 1.206 u[IU]/mL (ref 0.350–4.500)

## 2011-01-05 NOTE — Assessment & Plan Note (Signed)
This patient has been seen and examined by myself and by Dr. Domenic Polite. EKG shows atrial flutter as a new finding. Most recent EKG available showed normal sinus rhythm. Uncertain how low he has been in the rhythm and if this is the cause of his symptoms. He will need to be hospitalized to adjust medications and be monitored on telemetry for HR evaluation in this setting. In the interim, we will stop verapamil, change norvasc to 5 mg daily, add LMWH for anticoagulation.  He will have echo completed to evaluate LV fx and chamber sizes.    We have discussed this with John [name] who verbalizes understanding and is willing to be admitted.   He will be a direct admit to [facility] hospital. If EP is needed for their opinion, they will be requested during his evaluation.  Dr. Domenic Polite has spoken to Dr. Ron Parker by phone concerning this patient,.

## 2011-01-05 NOTE — Progress Notes (Signed)
HPI: John [name] is a pleasant 65 y/o patient of Dr. Lattie Haw, with history of Type II diabetes, hypertension, adn PSVT. He  seen in the office today for his complaints of dizziness, fluid retention and weight gain. With his history of difficult to control hypertension he has had multiple medication trials per Dr. Lattie Haw. Most recently, he has been placed on Norvasc 10 mg within the last 3 months, not tolerating catapress patch. He was seen last by Dr. Lattie Haw on August 27.2012 with complaints of dizziness and HCTZ dose was decreased from 25 mg to 12.5 mg.  He states he has been gaining wt, feeling dizzy and swelling his his lower extremities.  He denies chest pain, shortness of breath, but admits to lightheadedness. He mowed his yard yesterday on an acre lot.  His wife said that he had to stop to rest.  His wt is up 6  lbs from last visit, (274 lbs to 280 lbs) today.  Of note on EKG completed in the office he was found to be in Atrial flutter with variable conduction and a rate of 40 bpm. Orthostatics have been completed and are found to be negative.  Lying 128/62; sitting 132/58; 130/52.  He states he feels fine with the exception of the above complaint.  Most recent ECHO, completed  April of 2009 : SUMMARY   -  Left ventricular size was at the upper limits of normal. Overall  left ventricular systolic function was normal. Left         ventricular ejection fraction was estimated to be 60 %. There were no left ventricular regional wall motion abnormalities.         Left ventricular wall thickness was mildly increased.   -  Aortic valve thickness was mildly to moderately increased. The  aortic valve was mildly to moderately calcified. There was  mildly reduced aortic valve leaflet excursion. Findings were consistent with very mild aortic valve stenosis. The mean  transaortic valve gradient was 6 mmHg. Estimated aortic valve area (by VTI) was 2.64 cm^2.   -  There was mild fibrocalcific change of the  aortic root.   -  The effective orifice of mitral regurgitation by proximal         isovelocity surface area was 0.08 cm^2. The volume of mitral  regurgitation by proximal isovelocity surface area was 16 cc.   -  The left atrium was mild to moderately dilated.   -  The right atrium was mildly dilated.   No Known Allergies  Current Outpatient Prescriptions  Medication Sig Dispense Refill  . amLODipine (NORVASC) 10 MG tablet Take 1 tablet (10 mg total) by mouth daily.  90 tablet  3  . atorvastatin (LIPITOR) 80 MG tablet Take 1 tablet (80 mg total) by mouth daily.  90 tablet  1  . benazepril (LOTENSIN) 40 MG tablet Take 1 tablet (40 mg total) by mouth daily.  90 tablet  3  . doxazosin (CARDURA) 8 MG tablet TAKE 1 TABLET DAILY  90 tablet  1  . glyBURIDE (DIABETA) 5 MG tablet Take 1 tablet (5 mg total) by mouth 2 (two) times daily with a meal.  20 tablet  0  . hydrochlorothiazide (,MICROZIDE/HYDRODIURIL,) 12.5 MG capsule Take 1 capsule (12.5 mg total) by mouth daily.  30 capsule  6  . metoprolol (TOPROL-XL) 100 MG 24 hr tablet TAKE 1 TABLET DAILY  90 tablet  1  . sitaGLIPtan-metformin (JANUMET) 50-500 MG per tablet Take 1 tablet by mouth 2 (  two) times daily with a meal.        . tadalafil (CIALIS) 20 MG tablet Take 20 mg by mouth daily as needed.        . verapamil (COVERA HS) 240 MG (CO) 24 hr tablet Take 1 tablet (240 mg total) by mouth at bedtime.  90 tablet  3    Past Medical History  Diagnosis Date  . Myocardial infarction   . Osteoarthritis   . Diabetes mellitus     non insulin dependant  . Hypertension   . Hypercholesteremia   . Arrhythmia     PSVT    Past Surgical History  Procedure Date  . Knee arthroscopy     left  . Lumbar spine surgery     X 5  . Knee arthroscopy     Family History  Problem Relation Age of Onset  . Heart attack Mother   . Heart attack Father   . Heart attack Brother     History   Social History  . Marital Status: Married    Spouse Name:  N/A    Number of Children: N/A  . Years of Education: N/A   Occupational History  . Designer, industrial/product    Social History Main Topics  . Smoking status: Former Research scientist (life sciences)  . Smokeless tobacco: Not on file   Comment: Stop 29 yrs. ago.  . Alcohol Use: No  . Drug Use: No  . Sexually Active: Not on file   Other Topics Concern  . Not on file   Social History Narrative  . No narrative on file    ERQ:SXQKSK of systems complete and found to be negative unless listed above  PHYSICAL EXAM BP 130/52  Pulse 43  Ht 6' (1.829 m)  Wt 280 lb (127.007 kg)  BMI 37.97 kg/m2  SpO2 97% General: Well developed, well nourished, in no acute distress Head: Eyes PERRLA, No xanthomas.   Normal cephalic and atramatic  Lungs: Clear bilaterally to auscultation and percussion. Heart: HRRR S1 S1,3/8 systolic murmur at the LSB  Pulses are 2+ & equal.            No carotid bruit. No JVD.  No abdominal bruits. No femoral bruits. Abdomen: Bowel sounds are positive, abdomen mildly distended and non-tender without masses or                  Hernia's noted. Msk:  Back normal, normal gait. Normal strength and tone for age. Extremities: No clubbing, cyanosis 2+ pitting edema in the pre-tibial area.  DP +1 Neuro: Alert and oriented X 3. Psych:  Good affect, responds appropriately  EKG: Atrial flutter with variable conduction, rate of 40 bpm (new finding)  ASSESSMENT AND PLAN

## 2011-01-05 NOTE — Assessment & Plan Note (Signed)
We will continue him on current medications and place him on carbohydrate modified diet with FSBS during the day.

## 2011-01-05 NOTE — Assessment & Plan Note (Signed)
Careful adjustment of medications during hospitalization will be completed has he has had difficult to control hypertension by history.

## 2011-01-05 NOTE — Progress Notes (Signed)
Patient seen and examined with Ms. Lawrence. He is a patient of Dr. Lattie Haw, and his history was reviewed. He presents with episodic lightheadedness, no syncope or palpitations, also associated with at least a 6 pound weight gain and progressive lower extremity edema. He is on multimodal antihypertensives including dual AV node blockers for blood pressure control, which has been difficult over time. He is noted to be in atrial flutter today, duration uncertain, with heart rate in the 40s. Most recent medication adjustments included the addition of amlodipine over the last few months, and reduction and HCTZ since August.  Patient does have a history of PSVT, although no definite atrial arrhythmias. Cannot locate his most recent ECG in EMR, although he may have had one a few weeks ago on a nurse visit. Last one I could find showed sinus rhythm from a few years ago.  He has a CHADS2 score of 2 based on diabetes mellitus and hypertension. LVEF has been normal based on previous echocardiogram from 2009.  At rest, he reports no chest pain or shortness of breath, is not in distress. Orthostatics were negative today with systolic blood pressure in the 130s. Heart rate did not increase with these maneuvers however.  I discussed the situation with the patient and his wife present. Recommendation is to have him electively admitted to the hospital, on telemetry at [facility], so that he can undergo careful medication adjustments with direct observation of his heart rate and blood pressure, be placed on anticoagulation in case he requires cardioversion of his atrial flutter in the short-term, and also in case he requires a electrophysiology consultation for the possibility of tachycardia-bradycardia syndrome that may unmasked with medication adjustments. He also needs a followup 2-D echocardiogram to ensure the LVEF has not deteriorated. He will be gently diuresed as well.  I discussed the patient's situation with Dr.  Ron Parker at [facility] today. Further plans to follow pending assessment by our [facility] team.

## 2011-01-06 DIAGNOSIS — I517 Cardiomegaly: Secondary | ICD-10-CM

## 2011-01-06 LAB — GLUCOSE, CAPILLARY
Glucose-Capillary: 163 mg/dL — ABNORMAL HIGH (ref 70–99)
Glucose-Capillary: 128 mg/dL — ABNORMAL HIGH (ref 70–99)
Glucose-Capillary: 160 mg/dL — ABNORMAL HIGH (ref 70–99)

## 2011-01-06 LAB — LIPID PANEL
Cholesterol: 103 mg/dL (ref 0–200)
HDL: 52 mg/dL (ref >39)
LDL Cholesterol: 23 mg/dL (ref 0–99)
Triglycerides: 138 mg/dL (ref <150)

## 2011-01-07 DIAGNOSIS — I4892 Unspecified atrial flutter: Secondary | ICD-10-CM

## 2011-01-07 LAB — GLUCOSE, CAPILLARY
Glucose-Capillary: 153 mg/dL — ABNORMAL HIGH (ref 70–99)
Glucose-Capillary: 160 mg/dL — ABNORMAL HIGH (ref 70–99)

## 2011-01-08 LAB — GLUCOSE, CAPILLARY
Glucose-Capillary: 195 mg/dL — ABNORMAL HIGH (ref 70–99)
Glucose-Capillary: 140 mg/dL — ABNORMAL HIGH (ref 70–99)
Glucose-Capillary: 200 mg/dL — ABNORMAL HIGH (ref 70–99)

## 2011-01-09 LAB — GLUCOSE, CAPILLARY: Glucose-Capillary: 199 mg/dL — ABNORMAL HIGH (ref 70–99)

## 2011-01-10 LAB — GLUCOSE, CAPILLARY: Glucose-Capillary: 217 mg/dL — ABNORMAL HIGH (ref 70–99)

## 2011-01-15 ENCOUNTER — Encounter: Payer: Self-pay | Admitting: *Deleted

## 2011-01-17 ENCOUNTER — Encounter: Payer: Self-pay | Admitting: Physician Assistant

## 2011-01-17 ENCOUNTER — Ambulatory Visit (INDEPENDENT_AMBULATORY_CARE_PROVIDER_SITE_OTHER): Payer: Medicare Other | Admitting: Physician Assistant

## 2011-01-17 VITALS — BP 160/56 | HR 67 | Resp 18 | Ht 73.0 in | Wt 269.1 lb

## 2011-01-17 DIAGNOSIS — R0600 Dyspnea, unspecified: Secondary | ICD-10-CM

## 2011-01-17 DIAGNOSIS — I10 Essential (primary) hypertension: Secondary | ICD-10-CM

## 2011-01-17 DIAGNOSIS — E785 Hyperlipidemia, unspecified: Secondary | ICD-10-CM

## 2011-01-17 DIAGNOSIS — I5042 Chronic combined systolic (congestive) and diastolic (congestive) heart failure: Secondary | ICD-10-CM | POA: Insufficient documentation

## 2011-01-17 DIAGNOSIS — R0609 Other forms of dyspnea: Secondary | ICD-10-CM

## 2011-01-17 DIAGNOSIS — I4892 Unspecified atrial flutter: Secondary | ICD-10-CM

## 2011-01-17 MED ORDER — HYDRALAZINE HCL 25 MG PO TABS: 25.0000 mg | ORAL_TABLET | Freq: Three times a day (TID) | ORAL | Status: DC

## 2011-01-17 NOTE — Assessment & Plan Note (Signed)
Volume overload was exacerbated by significant bradycardia.  TTE demonstrated normal LVF but TEE demonstrated mildly reduced LVEF.  Volume appears stable now.  Check BMET and BNP today.

## 2011-01-17 NOTE — Patient Instructions (Signed)
Your physician recommends that you schedule a follow-up appointment in: 3-4 weeks with Dr Lovena Le Your physician recommends that you return for lab work in: today (BMP & BNP) MAKE SURE NOT TO MISS ANY DOSES OF PRADAXA

## 2011-01-17 NOTE — Progress Notes (Signed)
History of Present Illness: Primary Cardiologist:  Dr. Jacqulyn Ducking Primary Electrophysiologist:  Dr. [name]   John [name] is a 65 y.o. male who presents for post hospital follow up.   He has a h/o SVT and usually follows with Dr. Lattie Haw in [place].  He was admitted from the office 01/05/11 with dizziness and volume overload in the setting of new onset atrial flutter with SVR (heart rate in the 40s).  His verapamil and metoprolol were stopped. He was diuresed for volume overload.  Echo demonstrated normal LVF with mild LVH and mod to severe LAE.  It was originally thought that he would benefit from TEE guided RFCA of AFlutter.  But, with his significant atriopathy, it was thought he would not benefit from RFCA and was at risk for developing AFib.  He was therefore set up for TEE guided DCCV.  He was started on Pradaxa.  TEE demonstrated EF 45-50% and a LAA clot.  DCCV was deferred and he was d/c home with plans for elective DCCV after appropriate duration of anticoagulation therapy.  He is seen in the [place] office today for early post hospital follow up due to his recent admission for acute combined systolic/diastolic CHF.    Since d/c, he is doing well.  The patient denies chest pain, shortness of breath, syncope, orthopnea, PND or significant pedal edema. No further dizziness.  He may be a little short of breath when he first lays down, but does not prop up on pillows.  No PND.  No edema.  Weight is down since he came home.  No palpitations.  Taking all his medications ok.  He notes his BPs at home are 185U systolic.  Past Medical History  Diagnosis Date  . Osteoarthritis   . Diabetes mellitus     non insulin dependant  . Hypertension   . Hypercholesteremia   . Paroxysmal SVT (supraventricular tachycardia)     PSVT  . Atrial flutter     admx with symptomatic SVR (HR 40s) 10/12 c/b volume overload; AV nodal agents d/c'd and Pradaxa started; plan DCCV  . Chronic combined systolic  and diastolic heart failure     echo 01/06/11: mild LVH, EF 65-70%, mod to severe LAE, mild RVE, mild RAE, PASP 32;   TEE 10/12: EF 45-50%     Current Outpatient Prescriptions  Medication Sig Dispense Refill  . amLODipine (NORVASC) 10 MG tablet Take 5 mg by mouth daily.        Marland Kitchen atorvastatin (LIPITOR) 80 MG tablet Take 1 tablet (80 mg total) by mouth daily.  90 tablet  1  . benazepril (LOTENSIN) 40 MG tablet Take 1 tablet (40 mg total) by mouth daily.  90 tablet  3  . dabigatran (PRADAXA) 150 MG CAPS Take 150 mg by mouth every 12 (twelve) hours.        Marland Kitchen doxazosin (CARDURA) 8 MG tablet TAKE 1 TABLET DAILY  90 tablet  1  . glyBURIDE (DIABETA) 5 MG tablet Take 10 mg by mouth 2 (two) times daily with a meal.        . hydrochlorothiazide (HYDRODIURIL) 25 MG tablet Take 25 mg by mouth daily.        . sitaGLIPtan-metformin (JANUMET) 50-1000 MG per tablet Take 1 tablet by mouth 2 (two) times daily with a meal.        . tadalafil (CIALIS) 20 MG tablet Take 20 mg by mouth daily as needed.        . hydrALAZINE (  APRESOLINE) 25 MG tablet Take 1 tablet (25 mg total) by mouth 3 (three) times daily.  270 tablet  2    Allergies: No Known Allergies   History  Substance Use Topics  . Smoking status: Former Research scientist (life sciences)  . Smokeless tobacco: Not on file   Comment: Stop 29 yrs. ago.  . Alcohol Use: No    ROS:  Please see the history of present illness.  All other systems reviewed and negative.   Vital Signs: BP 160/56  Pulse 67  Resp 18  Ht 6' 1"  (1.854 m)  Wt 269 lb 1.9 oz (122.072 kg)  BMI 35.51 kg/m2 Repeat BP by me: 160/70 bilaterally  PHYSICAL EXAM: Well nourished, well developed, in no acute distress HEENT: normal Neck: no JVD Cardiac:  normal S1, S2; RRR; no murmur Lungs:  clear to auscultation bilaterally, no wheezing, rhonchi or rales Abd: soft, nontender, no hepatomegaly Ext: trace ankle edema bilaterally Skin: warm and dry Neuro:  CNs 2-12 intact, no focal abnormalities  noted Psych: normal affect  EKG:  Atrial flutter, HR 67, 4:1 conduction  ASSESSMENT AND PLAN:

## 2011-01-17 NOTE — Assessment & Plan Note (Signed)
Managed by PCP

## 2011-01-17 NOTE — Assessment & Plan Note (Signed)
Rate controlled off AV nodal blocking agents.  Continue Pradaxa.  Follow up with Dr. Lovena Le in [place] in 3-4 weeks.  Plan elective DCCV after appropriate period of adequate anticoagulation.

## 2011-01-17 NOTE — Assessment & Plan Note (Signed)
Uncontrolled.  Add hydralazine 25 mg TID.

## 2011-01-18 LAB — BRAIN NATRIURETIC PEPTIDE: Pro B Natriuretic peptide (BNP): 148 pg/mL — ABNORMAL HIGH (ref 0.0–100.0)

## 2011-01-18 LAB — BASIC METABOLIC PANEL
Calcium: 9.3 mg/dL (ref 8.4–10.5)
GFR: 104.85 mL/min (ref >60.00)
Potassium: 3.9 mEq/L (ref 3.5–5.1)
Sodium: 138 mEq/L (ref 135–145)

## 2011-01-18 NOTE — Discharge Summary (Addendum)
NAME:  John [name], John [name] NO.:  [id number]  MEDICAL RECORD NO.:  [id number]  LOCATION:  66                         FACILITY:  [facility]  PHYSICIAN:  Champ Mungo. Lovena Le, MD    DATE OF BIRTH:  01-Sep-1945  DATE OF ADMISSION:  01/05/2011 DATE OF DISCHARGE:  01/10/2011                              DISCHARGE SUMMARY   DISCHARGE DIAGNOSES: 1. Asymptomatic atrial fibrillation/atrial flutter with slow     ventricular response. 2. Asymptomatic bradycardia. 3. Acute/chronic systolic/diastolic heart failure.     a.     Left ventricular dysfunction with ejection fraction of 45-      50% by echo on January 08, 2011. 4. Left atrial appendage clot by transesophageal echocardiogram on     January 08, 2011. 5. Diabetes mellitus. 6. Obesity. 7. Hypertension. 8. Hyperlipidemia. 9. History of nonsustained supraventricular tachycardia. 10.Degenerative joint disease.  SURGICAL HISTORY:  C-spine surgery and knee surgery. HOSPITAL COURSE:  John [name] is a 65 year old gentleman with a history of obesity and diabetes and hypertension who was admitted with symptomatic atrial flutter.  He was evaluated by Dr. Ernestine Conrad, at [facility] office after complaining of symptoms of dizziness, weight gain, and edema.  He was found to have typical appearing atrial flutter with ventricular rates in the 40s and 50s.  He is chronically to his Toprol as well as verapamil.  He has a history of supraventricular tachycardia.  Chest x-ray revealed interstitial haziness at the bases possibly suggestive of fibrosis pattern.  It was felt that he had mild congestive heart failure that had improved with diuresis, most likely related to his atrial arrhythmia.  He was admitted to the hospital for further treatment.  His verapamil and metoprolol were discontinued given his slow ventricular response.  He was initiated on Pradaxa for stroke prophylaxis on admission to the hospital.  Plan was initially for an atrial  flutter ablation, however, Dr. Caryl Comes, felt that his massive left atrial enlargement with predictive of future atrial fibrillation.  He saw that it was hard to see the benefit of an ablation.  He favored cardioversion instead.  In the mean time, the patient had transesophageal echocardiogram in preparation for this, however, this did demonstrate a left atrial appendage clot and anticoagulation was continued.  Plan was for eventual cardioversion in 4-6 weeks after appropriate anticoagulation. On the day of discharge, the patient is feeling well.  His heart rate has ranged from the 45 to 75 range.  Dr. Lovena Le seen and examined him today and feels he is stable for discharge.  DISCHARGE LABS:  WBC is 9.4, hemoglobin 12.8, hematocrit 37.5, platelet count 133, sodium 139, potassium 4, chloride 102, CO2 of 26, glucose 162, BUN 13, and creatinine 0.86.  LFTs were normal with the exception of albumin 3.3 on October 12.  BNP was 1379.  A1c 8.4.  STUDIES: 1. Chest x-ray on January 06, 2011 showed probable interstitial     fibrosis in the lung bases.  No evidence of active pulmonary     disease. 2. Transesophageal echocardiogram on January 08, 2011 showed ejection     fraction of 45-50% with diffuse hypokinesis.  Mild mitral     regurgitation.  There is  a possible thrombus in the tip of the left     atrial appendage.  MEDICATIONS: 1. Pradaxa 150 mg q.12 h. 2. Amlodipine 10 mg half tablet daily. 3. Benazepril 40 mg daily. 4. Cialis 20 mg monthly as needed for erectile dysfunction. 5. Doxazosin 8 mg daily at bedtime. 6. Glyburide 10 mg 2 tablets b.i.d. 7. Hydrochlorothiazide 25 mg daily. 8. Janumet 50/1000 mg b.i.d. 9. Lipitor 80 mg at bedtime.  DISPOSITION:  John [name] will be discharged in stable condition to home. He is to increase activity slowly and follow a low-sodium heart-healthy diet.  Our office will call him to arrange a followup appointment with Dr. Jake Michaelis in 3-4 weeks and he will  then be scheduled hopefully for a cardioversion within 4-6 weeks.  Duration of discharge encounter greater than 30 minutes including physician and PA time.     Dayna Dunn, P.A.C.   ______________________________ Champ Mungo. Lovena Le, MD    DD/MEDQ  D:  01/10/2011  T:  01/10/2011  Job:  [id number]  Electronically Signed by Melina Copa  on 01/18/2011 10:27:10 AM Electronically Signed by [name] MD on 01/28/2011 12:26:30 AM

## 2011-01-25 NOTE — Consult Note (Signed)
NAME:  John [name], HUDSPETH NO.:  [id number]  MEDICAL RECORD NO.:  [id number]  LOCATION:  5284                         FACILITY:  [facility]  PHYSICIAN:  Thompson Grayer, MD       DATE OF BIRTH:  October 14, 1945  DATE OF CONSULTATION: DATE OF DISCHARGE:                                CONSULTATION   REQUESTING PHYSICIAN:  Jerline Pain, MD  REASON FOR CONSULTATION:  Atrial flutter.  HISTORY OF PRESENT ILLNESS:  [name] is a pleasant 65 year old gentleman with a history of obesity, diabetes, and hypertension who is admitted with symptomatic atrial flutter.  He was evaluated by Dr. Domenic Polite in our [place] office after complaining of symptoms of dizziness, weight gain, and edema.  Upon arrival, he was found to have typical appearing atrial flutter with ventricular rates into the 40s and 50s.  He is chronically treated with Toprol-XL 100 mg daily as well as verapamil 240 mg daily.  He carries a history of supraventricular tachycardia.  Upon review of the notes, it appears that he was seen by Dr. Lovena Le in 2006, at which time, he was having nonsustained episodes of supraventricular tachycardia for which he was minimally symptomatic. As he has now developed symptomatic atrial flutter, he was transferred to [facility] for further management.  He reports significant improvement in his edema with intravenous diuresis.  He has been appropriately anticoagulated with  Pradaxa.  He denies chest pain, dizziness, presyncope, syncope or any other concerns.  Presently, he is resting comfortably and is without complaint.  PAST MEDICAL HISTORY: 1. Obesity. 2. Diabetes. 3. Hypertension. 4. Hyperlipidemia. 5. History of nonsustained supraventricular tachycardia (as above). 6. Degenerative joint disease.  PAST SURGICAL HISTORY:  Spine surgery, knee surgery.  ALLERGIES:  No known drug allergies.  MEDICATIONS:  Reviewed in the [facility].  SOCIAL HISTORY:  The patient is retired and lives in  [place].  He denies tobacco, alcohol or drug use.  FAMILY HISTORY:  Notable for coronary artery disease.  REVIEW OF SYSTEMS:  All systems were reviewed and negative except as outlined in the HPI above.  PHYSICAL EXAMINATION:  VITAL SIGNS:  Telemetry reveals typical appearing atrial flutter with ventricular rates presently in the 60s, blood pressure 164/81, heart rate 63, respirations 18, sats 98% on room air, afebrile. GENERAL:  The patient is an obese, well-appearing male in no acute distress.  He is alert and oriented x3. HEENT:  Normocephalic, atraumatic.  Sclerae clear.  Conjunctivae pink. Oropharynx clear. NECK:  Supple.  No JVD, lymphadenopathy or bruits. LUNGS:  Clear. HEART:  Irregularly irregular rhythm.  No murmurs, rubs, or gallops. GI:  Soft, nontender, nondistended.  Positive bowel sounds. EXTREMITIES:  No clubbing or cyanosis.  He does have 1+ bilateral edema. SKIN:  No ecchymoses or lacerations. MUSCULOSKELETAL:  No deformity or atrophy. PSYCH:  Euthymic mood, full affect.  EKG reveals typical appearing atrial flutter with a ventricular rate of 69 beats per minute with premature ventricular contractions observed and nonspecific ST/T-wave changes.  Chest x-ray reveals interstitial haziness at the bases possibly suggestive of a fibrosing pattern.  LABORATORY DATA:  TSH 1.2, A1c 8.4, potassium 4.0, creatinine 0.86, hematocrit 37, platelets 133.  Echocardiogram remains  pending though in 2009, his ejection fraction was 60% by echocardiogram at that time.  IMPRESSION:  Mr. Kinne is a pleasant 65 year old gentleman who was transferred to [facility] for further evaluation and management of symptomatic typical appearing atrial flutter.  He presented with mild congestive heart failure which appears to have improved with diuresis. He has had intermittent low heart rates likely due to Toprol and verapamil.  These have been discontinued and his heart rate appears  to be increasing.  He has a history of nonsustained supraventricular tachycardia for which he has previously been asymptomatic.  He now presents with symptomatic typical appearing atrial flutter.  PLAN:  Therapeutic strategies for atrial flutter including cardioversion, rate control, and ablation were discussed at length with the patient.  He has been appropriately anticoagulated with Pradaxa since yesterday.  Risks, benefits, and alternatives to transesophageal echocardiogram guided EP study and ablation were discussed at length. The patient understands the risk which include stroke, bleeding, vascular damage, perforation, tamponade, AV block requiring a pacemaker, MI and death.  He accepts these risks and wishes to proceed.  We will therefore plan for transesophageal echocardiogram guided ablation at the next available time.  He will continue Pradaxa in the interim.  We will also hold his calcium channel blocker and beta-blocker and monitor his rhythm going forward.     Thompson Grayer, MD     JA/MEDQ  D:  01/07/2011  T:  01/07/2011  Job:  [id number]  cc:   Satira Sark, MD Jerline Pain, MD  Electronically Signed by Thompson Grayer MD on 01/25/2011 02:34:46 PM

## 2011-01-31 ENCOUNTER — Ambulatory Visit (INDEPENDENT_AMBULATORY_CARE_PROVIDER_SITE_OTHER): Payer: Medicare Other | Admitting: Internal Medicine

## 2011-01-31 ENCOUNTER — Encounter: Payer: Self-pay | Admitting: Internal Medicine

## 2011-01-31 DIAGNOSIS — I10 Essential (primary) hypertension: Secondary | ICD-10-CM

## 2011-01-31 DIAGNOSIS — Z7901 Long term (current) use of anticoagulants: Secondary | ICD-10-CM

## 2011-01-31 DIAGNOSIS — I4892 Unspecified atrial flutter: Secondary | ICD-10-CM

## 2011-01-31 DIAGNOSIS — I5042 Chronic combined systolic (congestive) and diastolic (congestive) heart failure: Secondary | ICD-10-CM

## 2011-01-31 NOTE — Assessment & Plan Note (Signed)
The patient's symptoms are class 2. He will continue a low sodium diet and his current medical therapy.

## 2011-01-31 NOTE — Patient Instructions (Addendum)
  Your physician has recommended that you have an ablation. Catheter ablation is a medical procedure used to treat some cardiac arrhythmias (irregular heartbeats). During catheter ablation, a long, thin, flexible tube is put into a blood vessel in your groin (upper thigh), or neck. This tube is called an ablation catheter. It is then guided to your heart through the blood vessel. Radio frequency waves destroy small areas of heart tissue where abnormal heartbeats may cause an arrhythmia to start. Please see the instruction sheet given to you today.  Your physician recommends that you return for lab work in: today  Your physician recommends that you schedule a follow-up appointment in: after procedure

## 2011-01-31 NOTE — Progress Notes (Signed)
HPI Mr. Cecena returns today for followup. He is a pleasant 65 yo man who was admitted with CHF and atrial flutter several weeks ago. He was begun on pradaxa and a TEE demonstrated a left atrial appendage thrombus. He has been anti-coagulated for 3 weeks. He denies palpitations. He has class 2 dyspnea. No c/p. He denies non-compliance. He has not had syncope. No Known Allergies   Current Outpatient Prescriptions  Medication Sig Dispense Refill  . amLODipine (NORVASC) 10 MG tablet Take 5 mg by mouth daily.        Marland Kitchen atorvastatin (LIPITOR) 80 MG tablet Take 1 tablet (80 mg total) by mouth daily.  90 tablet  1  . benazepril (LOTENSIN) 40 MG tablet Take 1 tablet (40 mg total) by mouth daily.  90 tablet  3  . dabigatran (PRADAXA) 150 MG CAPS Take 150 mg by mouth every 12 (twelve) hours.        Marland Kitchen doxazosin (CARDURA) 8 MG tablet TAKE 1 TABLET DAILY  90 tablet  1  . glyBURIDE (DIABETA) 5 MG tablet Take 10 mg by mouth 2 (two) times daily with a meal.        . hydrALAZINE (APRESOLINE) 25 MG tablet Take 1 tablet (25 mg total) by mouth 3 (three) times daily.  270 tablet  2  . hydrochlorothiazide (HYDRODIURIL) 25 MG tablet Take 25 mg by mouth daily.        . sitaGLIPtan-metformin (JANUMET) 50-1000 MG per tablet Take 1 tablet by mouth 2 (two) times daily with a meal.        . tadalafil (CIALIS) 20 MG tablet Take 20 mg by mouth daily as needed.           Past Medical History  Diagnosis Date  . Osteoarthritis   . Diabetes mellitus     non insulin dependant  . Hypertension   . Hypercholesteremia   . Paroxysmal SVT (supraventricular tachycardia)     PSVT  . Atrial flutter     admx with symptomatic SVR (HR 40s) 10/12 c/b volume overload; AV nodal agents d/c'd and Pradaxa started; plan DCCV  . Chronic combined systolic and diastolic heart failure     echo 01/06/11: mild LVH, EF 65-70%, mod to severe LAE, mild RVE, mild RAE, PASP 32;   TEE 10/12: EF 45-50%     ROS:   All systems reviewed and negative  except as noted in the HPI.   Past Surgical History  Procedure Date  . Knee arthroscopy     left  . Lumbar spine surgery     X 5  . Knee arthroscopy      Family History  Problem Relation Age of Onset  . Heart attack Mother   . Heart attack Father   . Heart attack Brother      History   Social History  . Marital Status: Married    Spouse Name: N/A    Number of Children: N/A  . Years of Education: N/A   Occupational History  . Designer, industrial/product    Social History Main Topics  . Smoking status: Former Research scientist (life sciences)  . Smokeless tobacco: Not on file   Comment: Stop 29 yrs. ago.  . Alcohol Use: No  . Drug Use: No  . Sexually Active: Not on file   Other Topics Concern  . Not on file   Social History Narrative  . No narrative on file     BP 148/70  Pulse 64  Ht 6' 5"  (1.956 m)  Wt 269 lb (122.018 kg)  BMI 31.90 kg/m2  Physical Exam:  Well appearing middle aged man, NAD HEENT: Unremarkable Neck:  No JVD, no thyromegally Lymphatics:  No adenopathy Back:  No CVA tenderness Lungs:  Clear with no wheezes, rales, or rhonchi. HEART:  Regular rate rhythm, no murmurs, no rubs, no clicks Abd:  soft, positive bowel sounds, no organomegally, no rebound, no guarding Ext:  2 plus pulses, no edema, no cyanosis, no clubbing Skin:  No rashes no nodules Neuro:  CN II through XII intact, motor grossly intact  EKG Atrial flutter with a controlled ventricular response.  Assess/Plan:

## 2011-01-31 NOTE — Assessment & Plan Note (Signed)
The patient's atrial flutter persists. I have discussed the treatment options with the patient. The risks/benefits/goals/expectations of catheter ablation have been discussed with the patient and he wishes to proceed. Will schedule catheter ablation.

## 2011-01-31 NOTE — Assessment & Plan Note (Signed)
His blood pressure is up a bit. After catheter ablation will attempt to uptitrate his meds.

## 2011-02-01 LAB — BASIC METABOLIC PANEL
Chloride: 104 mEq/L (ref 96–112)
Creat: 1.1 mg/dL (ref 0.50–1.35)
Potassium: 4.1 mEq/L (ref 3.5–5.3)

## 2011-02-01 LAB — PROTIME-INR
INR: 1.35 (ref <1.50)
Prothrombin Time: 17.2 seconds — ABNORMAL HIGH (ref 11.6–15.2)

## 2011-02-01 LAB — APTT: aPTT: 48 seconds — ABNORMAL HIGH (ref 24–37)

## 2011-02-02 ENCOUNTER — Encounter (HOSPITAL_COMMUNITY): Payer: Self-pay | Admitting: Pharmacy Technician

## 2011-02-07 ENCOUNTER — Encounter (HOSPITAL_COMMUNITY)
Admission: RE | Disposition: A | Discharge status: Home / Self Care | Payer: Self-pay | Source: Ambulatory Visit | Attending: Internal Medicine

## 2011-02-07 ENCOUNTER — Encounter (HOSPITAL_COMMUNITY): Payer: Self-pay | Admitting: Internal Medicine

## 2011-02-07 ENCOUNTER — Other Ambulatory Visit: Payer: Self-pay

## 2011-02-07 ENCOUNTER — Ambulatory Visit (HOSPITAL_COMMUNITY)
Admission: RE | Admit: 2011-02-07 | Discharge: 2011-02-08 | Disposition: A | Discharge status: Home / Self Care | Payer: Medicare Other | Source: Ambulatory Visit | Attending: Internal Medicine | Admitting: Internal Medicine

## 2011-02-07 DIAGNOSIS — I4892 Unspecified atrial flutter: Secondary | ICD-10-CM

## 2011-02-07 DIAGNOSIS — E119 Type 2 diabetes mellitus without complications: Secondary | ICD-10-CM | POA: Insufficient documentation

## 2011-02-07 DIAGNOSIS — E7800 Pure hypercholesterolemia, unspecified: Secondary | ICD-10-CM | POA: Insufficient documentation

## 2011-02-07 DIAGNOSIS — I5042 Chronic combined systolic (congestive) and diastolic (congestive) heart failure: Secondary | ICD-10-CM | POA: Insufficient documentation

## 2011-02-07 DIAGNOSIS — I10 Essential (primary) hypertension: Secondary | ICD-10-CM | POA: Insufficient documentation

## 2011-02-07 DIAGNOSIS — I509 Heart failure, unspecified: Secondary | ICD-10-CM | POA: Insufficient documentation

## 2011-02-07 HISTORY — PX: CARDIAC ELECTROPHYSIOLOGY STUDY AND ABLATION: SHX1294

## 2011-02-07 HISTORY — PX: ATRIAL FLUTTER ABLATION: SHX5733

## 2011-02-07 HISTORY — DX: Supraventricular tachycardia: I47.1

## 2011-02-07 HISTORY — DX: Supraventricular tachycardia, unspecified: I47.10

## 2011-02-07 LAB — GLUCOSE, CAPILLARY
Glucose-Capillary: 176 mg/dL — ABNORMAL HIGH (ref 70–99)
Glucose-Capillary: 311 mg/dL — ABNORMAL HIGH (ref 70–99)

## 2011-02-07 SURGERY — ATRIAL FLUTTER ABLATION
Anesthesia: LOCAL

## 2011-02-07 MED ORDER — MIDAZOLAM HCL 5 MG/5ML IJ SOLN
INTRAMUSCULAR | Status: AC
  Filled 2011-02-07: qty 5

## 2011-02-07 MED ORDER — FENTANYL CITRATE 0.05 MG/ML IJ SOLN
INTRAMUSCULAR | Status: AC
  Filled 2011-02-07: qty 2

## 2011-02-07 MED ORDER — GLYBURIDE 5 MG PO TABS
10.0000 mg | ORAL_TABLET | Freq: Two times a day (BID) | ORAL | Status: DC
  Administered 2011-02-07 – 2011-02-08 (×3): 10 mg via ORAL
  Filled 2011-02-07 (×4): qty 2

## 2011-02-07 MED ORDER — AMLODIPINE BESYLATE 5 MG PO TABS
5.0000 mg | ORAL_TABLET | Freq: Every day | ORAL | Status: DC
  Administered 2011-02-08: 5 mg via ORAL
  Filled 2011-02-07 (×2): qty 1

## 2011-02-07 MED ORDER — DABIGATRAN ETEXILATE MESYLATE 150 MG PO CAPS
150.0000 mg | ORAL_CAPSULE | Freq: Two times a day (BID) | ORAL | Status: DC
  Administered 2011-02-07 – 2011-02-08 (×2): 150 mg via ORAL
  Filled 2011-02-07 (×3): qty 1

## 2011-02-07 MED ORDER — ACETAMINOPHEN 325 MG PO TABS: 650.0000 mg | ORAL_TABLET | ORAL | Status: DC | PRN

## 2011-02-07 MED ORDER — SODIUM CHLORIDE 0.45 % IV SOLN
INTRAVENOUS | Status: DC
  Administered 2011-02-07: 14:00:00 via INTRAVENOUS

## 2011-02-07 MED ORDER — HYDRALAZINE HCL 25 MG PO TABS
25.0000 mg | ORAL_TABLET | Freq: Three times a day (TID) | ORAL | Status: DC
  Administered 2011-02-07 – 2011-02-08 (×3): 25 mg via ORAL
  Filled 2011-02-07 (×4): qty 1

## 2011-02-07 MED ORDER — HYDROCHLOROTHIAZIDE 25 MG PO TABS
25.0000 mg | ORAL_TABLET | Freq: Every day | ORAL | Status: DC
  Administered 2011-02-08: 25 mg via ORAL
  Filled 2011-02-07: qty 1

## 2011-02-07 MED ORDER — BENAZEPRIL HCL 40 MG PO TABS
40.0000 mg | ORAL_TABLET | Freq: Every day | ORAL | Status: DC
  Administered 2011-02-08: 40 mg via ORAL
  Filled 2011-02-07 (×2): qty 1

## 2011-02-07 MED ORDER — ONDANSETRON HCL 4 MG/2ML IJ SOLN: 4.0000 mg | Freq: Four times a day (QID) | INTRAMUSCULAR | Status: DC | PRN

## 2011-02-07 NOTE — Interval H&P Note (Signed)
History and Physical Interval Note:   02/07/2011   1:06 PM   John [name]  has presented today for surgery, with the diagnosis of Flutter  The various methods of treatment have been discussed with the patient and family. After consideration of risks, benefits and other options for treatment, the patient has consented to  Procedure(s): [place] as a surgical intervention .  The patients' history has been reviewed, patient examined, no change in status, stable for surgery.  I have reviewed the patients' chart and labs.  Questions were answered to the patient's satisfaction.     [name]  MD

## 2011-02-07 NOTE — Op Note (Signed)
NAME:  John [name], John [name] NO.:  [id number]  MEDICAL RECORD NO.:  [id number]  LOCATION:  41                         FACILITY:  [facility]  PHYSICIAN:  Champ Mungo. Lovena Le, MD    DATE OF BIRTH:  11-04-1945  DATE OF PROCEDURE:  02/07/2011 DATE OF DISCHARGE:                              OPERATIVE REPORT   PROCEDURE PERFORMED:  Electrophysiologic study and RF catheter ablation of atrial flutter.  INTRODUCTION:  The patient is a 65 year old male with history of atrial flutter and marked by atrial enlargement.  He has been on Pradaxa now for over a month.  He is referred now for catheter ablation.  PROCEDURE:  After informed consent was obtained, the patient was taken to the diagnostic EP lab in a fasting state.  After usual preparation and draping, intravenous fentanyl and midazolam was given for sedation. A 6-French hexapolar catheter was inserted percutaneously into the right jugular vein and advanced to coronary sinus.  A 7-French 20 pole Halo catheter was inserted percutaneously into the right femoral vein and advanced to the right atrium.  A 6-French quadripolar catheter was inserted percutaneously into the right femoral vein and advanced to his bundle region.  After measurement of basic intervals mapping was carried out demonstrating typical counterclockwise tricuspid annular reentrant atrial flutter, cycle length was 240 msec.  Then, a 7-French quadripolar ablation catheter was then advanced by way of the right femoral vein into the right atrium.  Mapping was carried out.  This demonstrated typical counterclockwise tricuspid annular reentrant atrial flutter. The ablation catheter was maneuvered onto the tricuspid valve anulus and a total of 16 RF energy applications were delivered between sites 5 and 7 on the tricuspid valve anulus.  Initially during RF energy application, the flutter was terminated.  Additional catheter manipulation however resulted in the initiation  of clockwise flutter. This also appeared to be isthmus dependent based on mapping criteria. The ablation catheter was repositioned into the atrial flutter isthmus and additional RF energy application was delivered.  A total of 16 RF energy applications were delivered both for the typical as well as reverse typical atrial flutter.  Following catheter ablation and restoration of sinus rhythm, the patient was carried out from the coronary sinus.  This demonstrated no inducible arrhythmias.  In addition, it demonstrated bidirectional atrial flutter isthmus block. At this point, the ablation catheter was maneuvered into the right ventricle and rapid ventricular pacing was carried out from the right ventricle at a pacing cycle length of 700 msec and stepwise decreased down to 370 msec where VA Wenckebach was observed.  During rapid ventricular pacing, the atrial activation sequence was midline decremental.  Next, programmed ventricular stimulation was carried out demonstrating AV node ERP of less than 500/280.  There were no inducible arrhythmias.  Finally rapid atrial pacing was carried out at pacing cycle length of 590 msec and stepwise decreased down to 520 msec where AV Wenckebach was observed.  During rapid atrial pacing, the PR interval was less than RR interval and there is no inducible SVT.  Finally programmed atrial stimulation was carried out from the coronary sinus and high right atrium at pacing cycle of 600 msec.  The S1-S2  interval stepwise decreased down to 400 msec and the AV node ERP was observed. During programmed atrial stimulation, there are no AH jumps, no echo beats, and no inducible SVT.  COMPLICATIONS:  There were no immediate procedure complications.  RESULTS:  A.  Baseline ECG.  Baseline ECG demonstrates atrial flutter with a slow ventricular response.  B.  Baseline intervals.  The atrial flutter cycle length was 240 msec.  The QRS duration was 120 msec. C.  Rapid  atrial pacing.  Rapid atrial pacing was carried out. Following ablation demonstrating AV Wenckebach cycle length of 520 msec. During rapid atrial pacing, the PR interval was less than the RR interval and there is no inducible SVT. D.  Programmed atrial stimulation.  Programmed atrial stimulation was carried out from the coronary sinus as well as the right atrium at base drive cycle length of 600 msec.  The S1-S2 interval stepwise decreased down to 400 msec where the AV node ERP was observed.  During programmed atrial stimulation, there were no AH jumps, no echo beats, no inducible SVT. E.  Rapid ventricular pacing.  Following ablation, rapid ventricular pacing was carried out from the right ventricle demonstrating a VA Wenckebach cycle length of 370 msec.  During rapid ventricular pacing, the atrial activation sequence was midline decremental. F.  Programmed ventricular stimulation.  Programmed ventricular stimulation was carried out from the right ventricle at base drive cycle length of 500 msec.  The S1-S2 interval stepwise decreased down to 280 msec where the retrograde AV node ERP was observed.  During programmed ventricular stimulation, the atrial activation sequence was midline and decremental. G.  Arrhythmias observed. 1. Atrial flutter (typical) initiation present at the time of EP     study, duration was sustained, cycle length was 240 msec.  Method     of termination was with catheter ablation. 2. Reverse typical atrial flutter.  Initiation was spontaneous during     catheter ablation.  Duration was sustained.  Cycle length was 250     msec and method of termination was with catheter ablation. 3. AH mapping.  Mapping of atrial flutter demonstrates a very large     atrial flutter isthmus. 4. RF energy application.  A total of 16 RF energy applications were     delivered to treat both typical as well as reverse typical atrial     flutter with termination of flutter and  restoration of sinus     rhythm, and creation of bidirectional block and atrial flutter     isthmus.  CONCLUSIONS:  Study demonstrates successful electrophysiologic study and catheter ablation of typical atrial flutter with a total of 16 RF energy applications resulting in the creation of atrial flutter isthmus block.     Champ Mungo. Lovena Le, MD     GWT/MEDQ  D:  02/07/2011  T:  02/07/2011  Job:  [id number]

## 2011-02-07 NOTE — Op Note (Signed)
EPS/RFA of atrial flutter performed without immediate complication. D# [id number]

## 2011-02-07 NOTE — H&P (View-Only) (Signed)
HPI John [name] returns today for followup. He is a pleasant 65 yo man who was admitted with CHF and atrial flutter several weeks ago. He was begun on pradaxa and a TEE demonstrated a left atrial appendage thrombus. He has been anti-coagulated for 3 weeks. He denies palpitations. He has class 2 dyspnea. No c/p. He denies non-compliance. He has not had syncope. No Known Allergies   Current Outpatient Prescriptions  Medication Sig Dispense Refill  . amLODipine (NORVASC) 10 MG tablet Take 5 mg by mouth daily.        Marland Kitchen atorvastatin (LIPITOR) 80 MG tablet Take 1 tablet (80 mg total) by mouth daily.  90 tablet  1  . benazepril (LOTENSIN) 40 MG tablet Take 1 tablet (40 mg total) by mouth daily.  90 tablet  3  . dabigatran (PRADAXA) 150 MG CAPS Take 150 mg by mouth every 12 (twelve) hours.        Marland Kitchen doxazosin (CARDURA) 8 MG tablet TAKE 1 TABLET DAILY  90 tablet  1  . glyBURIDE (DIABETA) 5 MG tablet Take 10 mg by mouth 2 (two) times daily with a meal.        . hydrALAZINE (APRESOLINE) 25 MG tablet Take 1 tablet (25 mg total) by mouth 3 (three) times daily.  270 tablet  2  . hydrochlorothiazide (HYDRODIURIL) 25 MG tablet Take 25 mg by mouth daily.        . sitaGLIPtan-metformin (JANUMET) 50-1000 MG per tablet Take 1 tablet by mouth 2 (two) times daily with a meal.        . tadalafil (CIALIS) 20 MG tablet Take 20 mg by mouth daily as needed.           Past Medical History  Diagnosis Date  . Osteoarthritis   . Diabetes mellitus     non insulin dependant  . Hypertension   . Hypercholesteremia   . Paroxysmal SVT (supraventricular tachycardia)     PSVT  . Atrial flutter     admx with symptomatic SVR (HR 40s) 10/12 c/b volume overload; AV nodal agents d/c'd and Pradaxa started; plan DCCV  . Chronic combined systolic and diastolic heart failure     echo 01/06/11: mild LVH, EF 65-70%, mod to severe LAE, mild RVE, mild RAE, PASP 32;   TEE 10/12: EF 45-50%     ROS:   All systems reviewed and negative  except as noted in the HPI.   Past Surgical History  Procedure Date  . Knee arthroscopy     left  . Lumbar spine surgery     X 5  . Knee arthroscopy      Family History  Problem Relation Age of Onset  . Heart attack Mother   . Heart attack Father   . Heart attack Brother      History   Social History  . Marital Status: Married    Spouse Name: N/A    Number of Children: N/A  . Years of Education: N/A   Occupational History  . Designer, industrial/product    Social History Main Topics  . Smoking status: Former Research scientist (life sciences)  . Smokeless tobacco: Not on file   Comment: Stop 29 yrs. ago.  . Alcohol Use: No  . Drug Use: No  . Sexually Active: Not on file   Other Topics Concern  . Not on file   Social History Narrative  . No narrative on file     BP 148/70  Pulse 64  Ht 6' 5"  (1.956 m)  Wt 269 lb (122.018 kg)  BMI 31.90 kg/m2  Physical Exam:  Well appearing middle aged man, NAD HEENT: Unremarkable Neck:  No JVD, no thyromegally Lymphatics:  No adenopathy Back:  No CVA tenderness Lungs:  Clear with no wheezes, rales, or rhonchi. HEART:  Regular rate rhythm, no murmurs, no rubs, no clicks Abd:  soft, positive bowel sounds, no organomegally, no rebound, no guarding Ext:  2 plus pulses, no edema, no cyanosis, no clubbing Skin:  No rashes no nodules Neuro:  CN II through XII intact, motor grossly intact  EKG Atrial flutter with a controlled ventricular response.  Assess/Plan:

## 2011-02-08 ENCOUNTER — Other Ambulatory Visit: Payer: Self-pay

## 2011-02-08 ENCOUNTER — Encounter (HOSPITAL_COMMUNITY): Payer: Self-pay

## 2011-02-08 LAB — GLUCOSE, CAPILLARY
Glucose-Capillary: 148 mg/dL — ABNORMAL HIGH (ref 70–99)
Glucose-Capillary: 160 mg/dL — ABNORMAL HIGH (ref 70–99)

## 2011-02-08 NOTE — Progress Notes (Signed)
Pt had questionable changes in his EKG rhythm a 12 lead EKG was done.  Pt's VS. Stable. Pt asymptomatic. Md on call.  (Dr. Radford Pax) made aware. No new orders received.

## 2011-02-08 NOTE — Discharge Summary (Signed)
Physician Discharge Summary  Patient ID: John [name] MRN: [id number] DOB/AGE: 1945-07-16 65 y.o.  Admit date: 02/07/2011 Discharge date: 02/08/2011  Primary Discharge Diagnosis: Atrial Flutter  Secondary Discharge Diagnosis: Hypertension, anti-coagulation with dabigatran, hyperlipidemia, chronic diastolic CHF,  Significant Diagnostic Studies: Electrophysiology study, radiofrequency catheter ablation  Hospital Course: John [name] is a 65 year old male with a history of atrial flutter and and heart failure. He was started on Pradaxa but an echo demonstrated normal LVF with mild LVH and mod to severe LAE.  TEE demonstrated a left atrial appendage thrombus and he was not cardioverted. He has been compliant with his medications. He was seen by Dr. Lovena Le and scheduled for admission after being anticoagulated with Pradaxa  for 3 weeks. He came to the hospital for electrophysiology study and possible ablation on 02/07/2011.  Study demonstrates successful electrophysiologic study and  catheter ablation of typical atrial flutter with a total of 16 RF energy  applications resulting in the creation of atrial flutter isthmus block. There were no immediate complications.  On 02/08/2011, John [name] was seen by Dr. Lovena Le. He was ambulating without chest pain or shortness of breath and is considered stable for discharge, to followup as an outpatient. His volume status is felt to be at baseline.  Discharge Exam: Blood pressure 142/63, pulse 84, temperature 98.5 F (36.9 C), temperature source Oral, resp. rate 16, height 6' (1.829 m), weight 260 lb (117.935 kg), SpO2 93.00%.   Labs:   Lab Results  Component Value Date   WBC 9.4 01/05/2011   HGB 12.8* 01/05/2011   HCT 37.5* 01/05/2011   MCV 84.7 01/05/2011   PLT 133* 01/05/2011   No results found for this basename: NA,K,CL,CO2,BUN,CREATININE,CALCIUM,LABALBU,PROT,BILITOT,ALKPHOS,ALT,AST,GLUCOSE in the last 168 hours  Lab Results  Component Value  Date   CHOL 103 01/06/2011   CHOL 144 06/10/2010   CHOL 201 12/19/2009   Lab Results  Component Value Date   HDL 52 01/06/2011   HDL 45 06/10/2010   HDL 54 12/19/2009   Lab Results  Component Value Date   LDLCALC 23 01/06/2011   LDLCALC 30 06/10/2010   LDLCALC 97 12/19/2009   Lab Results  Component Value Date   TRIG 138 01/06/2011   TRIG 345* 06/10/2010   TRIG 248 12/19/2009   Lab Results  Component Value Date   CHOLHDL 2.0 01/06/2011   CHOLHDL 3.2 Ratio 06/10/2010    FOLLOW UP PLANS AND APPOINTMENTS Discharge Orders    Future Appointments: Provider: Department: Dept Phone: Center:   02/20/2011  11:00 AM  Jory Sims, NP  [facility] [phone] [facility]     Current Discharge Medication List    CONTINUE these medications which have NOT CHANGED   Details  amLODipine (NORVASC) 10 MG tablet Take 5 mg by mouth daily.      atorvastatin (LIPITOR) 80 MG tablet Take 1 tablet (80 mg total) by mouth daily. Qty: 90 tablet, Refills: 1    benazepril (LOTENSIN) 40 MG tablet Take 1 tablet (40 mg total) by mouth daily. Qty: 90 tablet, Refills: 3   Associated Diagnoses: Essential hypertension, benign    dabigatran (PRADAXA) 150 MG CAPS Take 150 mg by mouth every 12 (twelve) hours.     doxazosin (CARDURA) 8 MG tablet TAKE 1 TABLET DAILY Qty: 90 tablet, Refills: 1    glyBURIDE (DIABETA) 5 MG tablet Take 10 mg by mouth 2 (two) times daily with a meal.      hydrALAZINE (APRESOLINE) 25 MG tablet Take 1 tablet (25 mg total) by mouth  3 (three) times daily. Qty: 270 tablet, Refills: 2    hydrochlorothiazide (HYDRODIURIL) 25 MG tablet Take 25 mg by mouth daily.      sitaGLIPtan-metformin (JANUMET) 50-1000 MG per tablet Take 1 tablet by mouth 2 (two) times daily with a meal.      tadalafil (CIALIS) 20 MG tablet Take 20 mg by mouth daily as needed. For erectile dysfunction         BRING ALL MEDICATIONS WITH YOU TO FOLLOW UP APPOINTMENTS  Time spent with patient to  include physician time: 52mn Signed: RRosaria Ferries11/15/2012, 4:11 PM Co-Sign MD

## 2011-02-08 NOTE — Progress Notes (Signed)
  Subjective:  No chest pain or sob or palpitations.  Objective:  Vital Signs in the last 24 hours: Temp:  [97.9 F (36.6 C)-99.2 F (37.3 C)] 98.5 F (36.9 C) (11/15 1320) Pulse Rate:  [84-89] 84  (11/15 1320) Resp:  [16-20] 16  (11/15 1320) BP: (123-161)/(63-81) 142/63 mmHg (11/15 1320) SpO2:  [93 %-96 %] 93 % (11/15 1320)  Intake/Output from previous day:   Intake/Output from this shift: Total I/O In: 240 [P.O.:240] Out: -   Physical Exam: Well appearing NAD HEENT: Unremarkable Neck:  No JVD, no thyromegally Lymphatics:  No adenopathy Back:  No CVA tenderness Lungs:  Clear with no wheezes, or rhonchi HEART:  Regular rate rhythm, no murmurs, no rubs, no clicks Abd:  Flat, positive bowel sounds, no organomegally, no rebound, no guarding Ext:  2 plus pulses, no edema, no cyanosis, no clubbing. No hematoma Skin:  No rashes no nodules Neuro:  CN II through XII intact, motor grossly intact  Lab Results: No results found for this basename: WBC:2,HGB:2,PLT:2 in the last 72 hours No results found for this basename: NA:2,K:2,CL:2,CO2:2,GLUCOSE:2,BUN:2,CREATININE:2 in the last 72 hours No results found for this basename: TROPONINI:2,CK,MB:2 in the last 72 hours Hepatic Function Panel No results found for this basename: PROT,ALBUMIN,AST,ALT,ALKPHOS,BILITOT,BILIDIR,IBILI in the last 72 hours No results found for this basename: CHOL in the last 72 hours No results found for this basename: PROTIME in the last 72 hours   Cardiac Studies: ECG - NSR.  Assessment/Plan:  1. Atrial flutter - s/p EPS/RFA with no immediate complications. He is maintaining NSR. Continue current meds.  2. Chronic systolic heart failure - He is maintain NSR.He will continue his current meds and maintain a low sodium diet.   LOS: 1 day    John [name] 02/08/2011, 3:46 PM

## 2011-02-13 NOTE — Progress Notes (Signed)
Addended by: Marlis Edelson C on: 02/13/2011 02:36 PM   Modules accepted: Orders

## 2011-02-20 ENCOUNTER — Ambulatory Visit: Payer: Medicare Other | Admitting: Cardiology

## 2011-02-20 ENCOUNTER — Encounter: Payer: Self-pay | Admitting: Adult Health

## 2011-02-20 ENCOUNTER — Ambulatory Visit (INDEPENDENT_AMBULATORY_CARE_PROVIDER_SITE_OTHER): Payer: Medicare Other | Admitting: Adult Health

## 2011-02-20 VITALS — BP 141/74 | HR 77 | Ht 73.0 in | Wt 262.0 lb

## 2011-02-20 DIAGNOSIS — Z9889 Other specified postprocedural states: Secondary | ICD-10-CM

## 2011-02-20 DIAGNOSIS — Z8679 Personal history of other diseases of the circulatory system: Secondary | ICD-10-CM | POA: Insufficient documentation

## 2011-02-20 DIAGNOSIS — I4892 Unspecified atrial flutter: Secondary | ICD-10-CM

## 2011-02-20 DIAGNOSIS — I10 Essential (primary) hypertension: Secondary | ICD-10-CM

## 2011-02-20 NOTE — Patient Instructions (Signed)
Your physician recommends that you schedule a follow-up appointment in: 3 months.  

## 2011-02-20 NOTE — Assessment & Plan Note (Signed)
Overall doing well. No complications or symptoms post ablation. He will remain on pradaxa for 3 months with discussion of need to continue this on future appointments. I have encouraged him to restart exercise program, slowly and not to do heavy lifting until full strength regained to avoid injury.  He is to maintain low salt diet.  He will follow-up with Dr. Lattie Haw in 3 months.

## 2011-02-20 NOTE — Assessment & Plan Note (Signed)
Blood pressure is improved from prior readings. In fact, review of his home BP readings show improvement since discharge from ablation procedure. Will continue current medications.  Multiple questions answered concerning the ablation procedure and BP.  He verbalizes understanding.

## 2011-02-20 NOTE — Progress Notes (Signed)
HPI: John [name] is a very pleasant 65 y/o patient of Dr's Rothbart and [name] we are seeing on follow-up for asymptomatic atrial flutter - s/p EPS/RFA on 02/09/2011. He has a history of afib/flutter with slow ventricular response, combined CHF, diabetes, and hypertension.  He is continued on Pradaxa, benazepril, and Norvasc since procedure. He is without complaint and anxious to return to normal activities and begin exercise program. He brings with him his BP readings since discharge for review.   No Known Allergies  Current Outpatient Prescriptions  Medication Sig Dispense Refill  . amLODipine (NORVASC) 10 MG tablet Take 5 mg by mouth daily.        Marland Kitchen atorvastatin (LIPITOR) 80 MG tablet Take 1 tablet (80 mg total) by mouth daily.  90 tablet  1  . benazepril (LOTENSIN) 40 MG tablet Take 1 tablet (40 mg total) by mouth daily.  90 tablet  3  . dabigatran (PRADAXA) 150 MG CAPS Take 150 mg by mouth every 12 (twelve) hours.       Marland Kitchen doxazosin (CARDURA) 8 MG tablet TAKE 1 TABLET DAILY  90 tablet  1  . glyBURIDE (DIABETA) 5 MG tablet Take 10 mg by mouth 2 (two) times daily with a meal.        . hydrALAZINE (APRESOLINE) 25 MG tablet Take 1 tablet (25 mg total) by mouth 3 (three) times daily.  270 tablet  2  . hydrochlorothiazide (HYDRODIURIL) 25 MG tablet Take 25 mg by mouth daily.        . sitaGLIPtan-metformin (JANUMET) 50-1000 MG per tablet Take 1 tablet by mouth 2 (two) times daily with a meal.        . tadalafil (CIALIS) 20 MG tablet Take 20 mg by mouth daily as needed. For erectile dysfunction        Past Medical History  Diagnosis Date  . Osteoarthritis   . Diabetes mellitus     non insulin dependant  . Hypertension   . Hypercholesteremia   . Chronic combined systolic and diastolic heart failure     echo 01/06/11: mild LVH, EF 65-70%, mod to severe LAE, mild RVE, mild RAE, PASP 32;   TEE 10/12: EF 45-50%   . Atrial flutter     admx with symptomatic SVR (HR 40s) 10/12 c/b volume  overload; AV nodal agents d/c'd and Pradaxa started; plan DCCV  . PSVT (paroxysmal supraventricular tachycardia)     Past Surgical History  Procedure Date  . Knee arthroscopy 2005    left  . Lumbar spine surgery     "I've had 6 ORs 1972 thru 2004"  . Cardiac electrophysiology study and ablation 02/07/11    ROS: PHYSICAL EXAM BP 141/74  Pulse 77  Ht 6' 1"  (1.854 m)  Wt 262 lb (118.842 kg)  BMI 34.57 kg/m2  SpO2 97%  General: Well developed, well nourished, in no acute distress Head: Eyes PERRLA, No xanthomas.   Normal cephalic and atramatic  Lungs: Clear bilaterally to auscultation and percussion. Heart: HRRR S1 S2, without MRG.  Pulses are 2+ & equal.            No carotid bruit. No JVD.  No abdominal bruits. No femoral bruits. Abdomen: Bowel sounds are positive, abdomen soft and non-tender, ventral hernia is noted. Msk:  Back normal, normal gait. Normal strength and tone for age. Extremities: No clubbing, cyanosis or edema.  DP +1 Neuro: Alert and oriented X 3. Psych:  Good affect, responds appropriately  EKG: NSR rate  of 86 bpm. Anteroseptal infarct.  ASSESSMENT AND PLAN

## 2011-03-20 ENCOUNTER — Other Ambulatory Visit: Payer: Self-pay | Admitting: Adult Health

## 2011-03-22 ENCOUNTER — Telehealth: Payer: Self-pay | Admitting: Cardiology

## 2011-03-22 MED ORDER — HYDRALAZINE HCL 25 MG PO TABS: 25.0000 mg | ORAL_TABLET | Freq: Three times a day (TID) | ORAL | Status: DC

## 2011-03-22 NOTE — Telephone Encounter (Signed)
Patient's dose of Hydrochlorothiazide was raised from 12.65m to 224m  Patient has run out due to having to take 2 pills each time.  States he can not get it refilled because it is not time.  Would like a RX for Hydrochlorothiazide 2553m20 pills[place] [facility] [place] [facility] [place] [facility]. / tg

## 2011-03-23 ENCOUNTER — Other Ambulatory Visit: Payer: Self-pay | Admitting: *Deleted

## 2011-03-23 ENCOUNTER — Other Ambulatory Visit: Payer: Self-pay

## 2011-03-23 MED ORDER — HYDROCHLOROTHIAZIDE 25 MG PO TABS: 25.0000 mg | ORAL_TABLET | Freq: Every day | ORAL | Status: DC

## 2011-03-29 ENCOUNTER — Other Ambulatory Visit: Payer: Self-pay | Admitting: Cardiology

## 2011-04-04 ENCOUNTER — Telehealth: Payer: Self-pay | Admitting: Cardiology

## 2011-04-04 NOTE — Telephone Encounter (Signed)
Pt states that metoprolol was [facility] to his house. He has been off of this med for three months.

## 2011-04-04 NOTE — Telephone Encounter (Signed)
Metoprolol removed from patient's regimen, as it was stopped upon discharge from hospital in October and he has not taken since.

## 2011-04-09 ENCOUNTER — Other Ambulatory Visit: Payer: Self-pay | Admitting: Cardiology

## 2011-05-04 ENCOUNTER — Ambulatory Visit (INDEPENDENT_AMBULATORY_CARE_PROVIDER_SITE_OTHER): Payer: Medicare Other | Admitting: Cardiology

## 2011-05-04 ENCOUNTER — Encounter: Payer: Self-pay | Admitting: Cardiology

## 2011-05-04 VITALS — BP 144/74 | HR 70 | Ht 73.0 in | Wt 266.0 lb

## 2011-05-04 DIAGNOSIS — E785 Hyperlipidemia, unspecified: Secondary | ICD-10-CM

## 2011-05-04 DIAGNOSIS — I5042 Chronic combined systolic (congestive) and diastolic (congestive) heart failure: Secondary | ICD-10-CM

## 2011-05-04 DIAGNOSIS — E119 Type 2 diabetes mellitus without complications: Secondary | ICD-10-CM

## 2011-05-04 DIAGNOSIS — I10 Essential (primary) hypertension: Secondary | ICD-10-CM

## 2011-05-04 DIAGNOSIS — I4892 Unspecified atrial flutter: Secondary | ICD-10-CM

## 2011-05-04 DIAGNOSIS — Z7901 Long term (current) use of anticoagulants: Secondary | ICD-10-CM | POA: Insufficient documentation

## 2011-05-04 NOTE — Assessment & Plan Note (Addendum)
No documented recurrence since radiofrequency ablation, but patient is in a high risk category for thromboembolism due to severe hypertension that is now adequately controlled with 5 drugs, diabetes and a history of left atrial appendage thrombus on prior transesophageal echocardiography.  Accordingly, I am not so in the eager to discontinue full anticoagulation.  Patient will undergo 48 hours of cardiac monitoring to further define his typical rhythm and any arrhythmias that are occurring frequently.  Full dose dabigatran will be continued for now.  CBC and stool for Hemoccult testing will be assessed to exclude occult GI blood loss.

## 2011-05-04 NOTE — Progress Notes (Signed)
Patient ID: John [name], male   DOB: Oct 04, 1945, 66 y.o.   MRN: [id number] HPI: Scheduled return visit for this very nice gentleman who underwent RFA for atrial flutter 3 months ago.  He was asymptomatic prior to the procedure except for tachycardia-induced CHF, and this has not changed since.   He remains fairly active without any cardiopulmonary symptoms.  Prior to Admission medications   Medication Sig Start Date End Date Taking? Authorizing Provider  amLODipine (NORVASC) 10 MG tablet Take 5 mg by mouth daily.   12/28/10 12/28/11 Yes Cristopher Estimable. Emorie Mcfate, MD  atorvastatin (LIPITOR) 80 MG tablet TAKE 1 TABLET DAILY 03/20/11  Yes Cristopher Estimable. Hollee Fate, MD  benazepril (LOTENSIN) 40 MG tablet Take 1 tablet (40 mg total) by mouth daily. 12/28/10  Yes Cristopher Estimable. Alverta Caccamo, MD  dabigatran (PRADAXA) 150 MG CAPS Take 150 mg by mouth every 12 (twelve) hours.    Yes Historical Provider, MD  doxazosin (CARDURA) 8 MG tablet TAKE 1 TABLET DAILY 04/09/11  Yes Jory Sims, NP  glyBURIDE (DIABETA) 5 MG tablet Take 10 mg by mouth 2 (two) times daily with a meal.   12/04/10  Yes Cristopher Estimable. Rosangela Fehrenbach, MD  hydrALAZINE (APRESOLINE) 25 MG tablet Take 1 tablet (25 mg total) by mouth 3 (three) times daily. 03/22/11 03/21/12 Yes Cristopher Estimable. Sebron Mcmahill, MD  hydrochlorothiazide (HYDRODIURIL) 25 MG tablet Take 1 tablet (25 mg total) by mouth daily. 03/23/11  Yes Cristopher Estimable. Lattie Haw, MD  sitaGLIPtan-metformin (JANUMET) 50-1000 MG per tablet Take 1 tablet by mouth 2 (two) times daily with a meal.     Yes Historical Provider, MD  tadalafil (CIALIS) 20 MG tablet Take 20 mg by mouth daily as needed. For erectile dysfunction   Yes Historical Provider, MD    No Known Allergies    Past medical history, social history, and family history reviewed and updated.  ROS: Denies orthopnea, PND, exertional dyspnea, chest discomfort, palpitations, lightheadedness or syncope.  No cough or sputum production.  PHYSICAL EXAM: BP 144/74  Pulse 70   Ht 6' 1"  (1.854 m)  Wt 120.657 kg (266 lb)  BMI 35.09 kg/m2   General-Well developed; no acute distress Body habitus-moderately overweight Neck-No JVD; no carotid bruits Lungs-clear lung fields; resonant to percussion Cardiovascular-normal PMI; normal S1 and S2; modest basilar systolic ejection murmur; occasional premature Abdomen-normal bowel sounds; soft and non-tender without masses or organomegaly Musculoskeletal-No deformities, no cyanosis or clubbing Neurologic-Normal cranial nerves; symmetric strength and tone Skin-Warm, no significant lesions Extremities-distal pulses intact; trace edema  Rhythm strip: normal sinus rhythm at a rate of 69 bpm, 1st degree AV block; moderately frequent PACs; when monitor was active but not observed by the nurse or physician, a run of tachycardia lasting perhaps 10 beats was heard, but simultaneous EKG was not recorded.  ASSESSMENT AND PLAN:  Jacqulyn Ducking, MD 05/04/2011 4:27 PM

## 2011-05-04 NOTE — Assessment & Plan Note (Signed)
Blood pressure control is fairly good, but not optimal.  Patient will collect additional measurements at home and bring the list to his next office visit in one month.

## 2011-05-04 NOTE — Patient Instructions (Signed)
Your physician recommends that you schedule a follow-up appointment in: 1 month  Your physician has requested that you regularly monitor and record your blood pressure readings at home. Please use the same machine at the same time of day to check your readings and record them to bring to your follow-up visit.  Your physician has recommended that you wear a holter monitor. Holter monitors are medical devices that record the heart's electrical activity. Doctors most often use these monitors to diagnose arrhythmias. Arrhythmias are problems with the speed or rhythm of the heartbeat. The monitor is a small, portable device. You can wear one while you do your normal daily activities. This is usually used to diagnose what is causing palpitations/syncope (passing out).

## 2011-05-05 NOTE — Assessment & Plan Note (Signed)
Excellent control except for elevated triglycerides.  This will improve if diabetic control is tightened.

## 2011-05-05 NOTE — Assessment & Plan Note (Signed)
Suboptimal control.  Dr. Wolfgang Phoenix may wish to adjust pharmacologic therapy.

## 2011-05-05 NOTE — Assessment & Plan Note (Signed)
Compensated at present.  Patient will monitor weight at home and call for any significant increase.

## 2011-05-10 ENCOUNTER — Ambulatory Visit (HOSPITAL_COMMUNITY)
Admission: RE | Admit: 2011-05-10 | Discharge: 2011-05-10 | Disposition: A | Discharge status: Home / Self Care | Payer: Medicare Other | Source: Ambulatory Visit | Attending: Cardiology | Admitting: Cardiology

## 2011-05-10 DIAGNOSIS — I10 Essential (primary) hypertension: Secondary | ICD-10-CM | POA: Insufficient documentation

## 2011-05-10 DIAGNOSIS — E119 Type 2 diabetes mellitus without complications: Secondary | ICD-10-CM | POA: Insufficient documentation

## 2011-05-10 DIAGNOSIS — I4892 Unspecified atrial flutter: Secondary | ICD-10-CM | POA: Insufficient documentation

## 2011-05-10 NOTE — Progress Notes (Signed)
*  PRELIMINARY RESULTS* Echocardiogram 48H Holter monitor has been performed.  Tera Partridge 05/10/2011, 9:38 AM

## 2011-05-23 ENCOUNTER — Ambulatory Visit: Payer: Medicare Other | Admitting: Cardiology

## 2011-05-24 ENCOUNTER — Encounter: Payer: Self-pay | Admitting: Cardiology

## 2011-05-25 NOTE — Procedures (Signed)
NAME:  MAXIMUS, HOFFERT NO.:  [id number]  MEDICAL RECORD NO.:  [id number]  LOCATION:  CARDIOPU                      FACILITY:  [facility]  PHYSICIAN:  Cristopher Estimable. Lattie Haw, MD, FACCDATE OF BIRTH:  01/10/1946  DATE OF PROCEDURE:  05/10/2011 DATE OF DISCHARGE:  05/10/2011                               HOLTER MONITOR   INDICATION:  A 66 year old gentleman with a history of atrial flutter.  1. Continuous electrocardiographic recording was maintained for 48     hours, during which, the predominant rhythm was normal sinus with     first-degree AV block.  Sinus tachycardia occurred at rates up to     150. 2. Minimal PVCs were identified, occurring at an average rate of fewer     than 1 per hour.  Frequent PACs were present with an average rate     of 135 per hour.  There were a few runs of supraventricular     tachycardia at a rate of 130 bpm lasting up to 30 beats. 3. No ST-segment elevation or depression was seen. 4. A complete diary of activity was returned with no symptoms noted.     Cristopher Estimable. Lattie Haw, MD, [facility]     RMR/MEDQ  D:  05/24/2011  T:  05/25/2011  Job:  [id number]

## 2011-05-28 ENCOUNTER — Other Ambulatory Visit: Payer: Self-pay | Admitting: Cardiology

## 2011-05-29 ENCOUNTER — Telehealth: Payer: Self-pay | Admitting: Cardiology

## 2011-05-29 MED ORDER — AMLODIPINE BESYLATE 5 MG PO TABS: 5.0000 mg | ORAL_TABLET | Freq: Every day | ORAL | Status: DC

## 2011-05-29 NOTE — Telephone Encounter (Signed)
Please send RX for Amlodopine 35m to [facility] Dose was lowered from 10 to 5 at last visit. / tg

## 2011-06-11 ENCOUNTER — Ambulatory Visit: Payer: Medicare Other | Admitting: Cardiology

## 2011-06-19 ENCOUNTER — Ambulatory Visit (INDEPENDENT_AMBULATORY_CARE_PROVIDER_SITE_OTHER): Payer: Medicare Other | Admitting: Cardiology

## 2011-06-19 ENCOUNTER — Encounter: Payer: Self-pay | Admitting: Cardiology

## 2011-06-19 VITALS — BP 150/71 | HR 97 | Resp 18 | Ht 72.0 in | Wt 265.0 lb

## 2011-06-19 DIAGNOSIS — I5042 Chronic combined systolic (congestive) and diastolic (congestive) heart failure: Secondary | ICD-10-CM

## 2011-06-19 DIAGNOSIS — I10 Essential (primary) hypertension: Secondary | ICD-10-CM

## 2011-06-19 DIAGNOSIS — Z7901 Long term (current) use of anticoagulants: Secondary | ICD-10-CM

## 2011-06-19 DIAGNOSIS — I4892 Unspecified atrial flutter: Secondary | ICD-10-CM

## 2011-06-19 DIAGNOSIS — E785 Hyperlipidemia, unspecified: Secondary | ICD-10-CM

## 2011-06-19 MED ORDER — METOPROLOL SUCCINATE ER 50 MG PO TB24: 50.0000 mg | ORAL_TABLET | Freq: Every day | ORAL | Status: DC

## 2011-06-19 MED ORDER — AMLODIPINE BESYLATE 10 MG PO TABS: 10.0000 mg | ORAL_TABLET | Freq: Every day | ORAL | Status: DC

## 2011-06-19 NOTE — Progress Notes (Signed)
Patient ID: John [name], male   DOB: 1945-08-24, 66 y.o.   MRN: [id number] HPI: Scheduled return visit for this very nice gentleman with atrial flutter, now status post radiofrequency ablation, and tachycardia induced cardiomyopathy.  Since his prior office visit, John [name] has done superbly.  He is pursuing his usual activities without any cardiopulmonary symptoms.  He asks whether he can take Cialis, which she has used previously with good result.  Prior to Admission medications   Medication Sig Start Date End Date Taking? Authorizing Provider  amLODipine (NORVASC) 5 MG tablet Take 1 tablet (5 mg total) by mouth daily. 05/29/11 05/28/12 Yes Yehuda Savannah, MD  atorvastatin (LIPITOR) 80 MG tablet TAKE 1 TABLET DAILY 05/28/11  Yes Yehuda Savannah, MD  benazepril (LOTENSIN) 40 MG tablet Take 1 tablet (40 mg total) by mouth daily. 12/28/10  Yes Yehuda Savannah, MD  dabigatran (PRADAXA) 150 MG CAPS Take 150 mg by mouth every 12 (twelve) hours.    Yes Historical Provider, MD  doxazosin (CARDURA) 8 MG tablet TAKE 1 TABLET DAILY 04/09/11  Yes Lendon Colonel, NP  glyBURIDE (DIABETA) 5 MG tablet Take 10 mg by mouth 2 (two) times daily with a meal.   12/04/10  Yes Yehuda Savannah, MD  hydrALAZINE (APRESOLINE) 25 MG tablet Take 1 tablet (25 mg total) by mouth 3 (three) times daily. 03/22/11 03/21/12 Yes Yehuda Savannah, MD  hydrochlorothiazide (HYDRODIURIL) 25 MG tablet Take 1 tablet (25 mg total) by mouth daily. 03/23/11  Yes Yehuda Savannah, MD  sitaGLIPtan-metformin (JANUMET) 50-1000 MG per tablet Take 1 tablet by mouth 2 (two) times daily with a meal.     Yes Historical Provider, MD  tadalafil (CIALIS) 20 MG tablet Take 20 mg by mouth daily as needed. For erectile dysfunction   Yes Historical Provider, MD  No Known Allergies    Past medical history, social history, and family history reviewed and updated.  ROS: Denies chest discomfort, exertional dyspnea, orthopnea, PND, pedal edema,  palpitations, lightheadedness or syncope.  All other systems reviewed and are negative.  PHYSICAL EXAM: BP 150/71  Pulse 97  Resp 18  Ht 6' (1.829 m)  Wt 120.203 kg (265 lb)  BMI 35.94 kg/m2  General-Well developed; no acute distress Body habitus-Moderately overweight Neck-No JVD; no carotid bruits Lungs-clear lung fields; resonant to percussion Cardiovascular-normal PMI; normal S1 and S2 Abdomen-normal bowel sounds; soft and non-tender without masses or organomegaly Musculoskeletal-No deformities, no cyanosis or clubbing Neurologic-Normal cranial nerves; symmetric strength and tone Skin-Warm, no significant lesions Extremities-distal pulses intact; trace edema  ASSESSMENT AND PLAN:  Jacqulyn Ducking, MD 06/19/2011 1:56 PM

## 2011-06-19 NOTE — Assessment & Plan Note (Addendum)
Blood pressure control has been generally good at home, but is suboptimal today.  In addition, patient wishes to resume use of Cialis.  Hydralazine is likely contributing little and will be discontinued.  Dose of amlodipine will be doubled.  Metoprolol will be added at a total daily dose of 50 mg and Cardura will be discontinued.  Patient will monitor blood pressure at home and return for reassessment by the cardiology nurses.

## 2011-06-19 NOTE — Assessment & Plan Note (Addendum)
Patient is maintained on high dose atorvastatin resulting in extremely low total and LDL cholesterol.  Better control of diabetes will likely further reduce triglycerides and possibly increased HDL.

## 2011-06-19 NOTE — Assessment & Plan Note (Signed)
Patient continues on dabigatran without adverse effects.  Due to continuing, albeit brief, arrhythmias and left atrial thrombus by transesophageal echocardiography, I am inclined to continue full anticoagulation for a total of at least one year.

## 2011-06-19 NOTE — Patient Instructions (Addendum)
Your physician recommends that you schedule a follow-up appointment in:  1 - 4 months with Dr Lattie Haw 2 - 1 month for a blood pressure check  Your physician has requested that you have an echocardiogram. Echocardiography is a painless test that uses sound waves to create images of your heart. It provides your doctor with information about the size and shape of your heart and how well your heart's chambers and valves are working. This procedure takes approximately one hour. There are no restrictions for this procedure.  Your physician has requested that you regularly monitor and record your blood pressure readings at home. Please use the same machine at the same time of day to check your readings and record them to bring to your follow-up visit. (Call us with blood pressures over 160/100)  Your physician has recommended you make the following change in your medication:  1 - STOP Hydralazine and Cardura 2 - INCREASE Amlodipine to 10 mg daily 3 - START Metoprolol 50 mg daily

## 2011-06-19 NOTE — Assessment & Plan Note (Signed)
Left ventricular dysfunction presumed secondary to tachycardia.  Echocardiography will be repeated to verify that LV systolic function has normalized.

## 2011-06-19 NOTE — Assessment & Plan Note (Signed)
No recurrence of sustained atrial arrhythmias; supraventricular tachycardia lasting fewer than 30 seconds documented by Holter without symptoms.  Beta blocker will be added to his medical regime in an attempt to further suppress arrhythmia.  The likelihood of recurrent atrial flutter is uncertain.

## 2011-06-20 ENCOUNTER — Other Ambulatory Visit: Payer: Self-pay | Admitting: *Deleted

## 2011-06-20 ENCOUNTER — Ambulatory Visit (HOSPITAL_COMMUNITY)
Admission: RE | Admit: 2011-06-20 | Discharge: 2011-06-20 | Disposition: A | Discharge status: Home / Self Care | Payer: Medicare Other | Source: Ambulatory Visit | Attending: Cardiology | Admitting: Cardiology

## 2011-06-20 DIAGNOSIS — I5042 Chronic combined systolic (congestive) and diastolic (congestive) heart failure: Secondary | ICD-10-CM | POA: Insufficient documentation

## 2011-06-20 DIAGNOSIS — E785 Hyperlipidemia, unspecified: Secondary | ICD-10-CM | POA: Insufficient documentation

## 2011-06-20 DIAGNOSIS — I10 Essential (primary) hypertension: Secondary | ICD-10-CM | POA: Insufficient documentation

## 2011-06-20 DIAGNOSIS — E119 Type 2 diabetes mellitus without complications: Secondary | ICD-10-CM | POA: Insufficient documentation

## 2011-06-20 DIAGNOSIS — I4892 Unspecified atrial flutter: Secondary | ICD-10-CM | POA: Insufficient documentation

## 2011-06-20 DIAGNOSIS — I517 Cardiomegaly: Secondary | ICD-10-CM

## 2011-06-20 NOTE — Progress Notes (Signed)
*  PRELIMINARY RESULTS* Echocardiogram 2D Echocardiogram has been performed.  John [name] 06/20/2011, 10:17 AM

## 2011-06-22 ENCOUNTER — Encounter: Payer: Self-pay | Admitting: Cardiology

## 2011-07-17 ENCOUNTER — Ambulatory Visit (INDEPENDENT_AMBULATORY_CARE_PROVIDER_SITE_OTHER): Payer: Medicare Other | Admitting: *Deleted

## 2011-07-17 VITALS — BP 134/63 | HR 49 | Ht 72.0 in | Wt 260.0 lb

## 2011-07-17 DIAGNOSIS — I10 Essential (primary) hypertension: Secondary | ICD-10-CM

## 2011-07-17 NOTE — Progress Notes (Signed)
Presents for scheduled blood pressure check.  Medications reconciled via list and states taking as prescribed.  Blood pressure record provided.  Denies complaints at this time.

## 2011-07-21 NOTE — Progress Notes (Signed)
06/2011: List of 30 blood pressures returned with all systolics less than 694 and all diastolics less than 80.  3 systolics of 370-052, which represents adequate control of hypertension.  Continue current therapy.

## 2011-07-23 ENCOUNTER — Encounter: Payer: Self-pay | Admitting: Cardiology

## 2011-08-03 ENCOUNTER — Other Ambulatory Visit: Payer: Self-pay | Admitting: *Deleted

## 2011-08-03 MED ORDER — DABIGATRAN ETEXILATE MESYLATE 150 MG PO CAPS: 150.0000 mg | ORAL_CAPSULE | Freq: Two times a day (BID) | ORAL | Status: DC

## 2011-08-07 ENCOUNTER — Other Ambulatory Visit: Payer: Self-pay | Admitting: *Deleted

## 2011-08-07 ENCOUNTER — Telehealth: Payer: Self-pay | Admitting: Internal Medicine

## 2011-08-07 MED ORDER — DABIGATRAN ETEXILATE MESYLATE 150 MG PO CAPS: 150.0000 mg | ORAL_CAPSULE | Freq: Two times a day (BID) | ORAL | Status: DC

## 2011-09-21 ENCOUNTER — Other Ambulatory Visit: Payer: Self-pay | Admitting: Cardiology

## 2011-09-21 MED ORDER — HYDROCHLOROTHIAZIDE 25 MG PO TABS: 25.0000 mg | ORAL_TABLET | Freq: Every day | ORAL | Status: DC

## 2011-09-21 NOTE — Telephone Encounter (Signed)
Please send refill for 90 day supply to [facility]. / tg

## 2011-10-17 ENCOUNTER — Encounter: Payer: Self-pay | Admitting: Cardiology

## 2011-10-17 ENCOUNTER — Ambulatory Visit (INDEPENDENT_AMBULATORY_CARE_PROVIDER_SITE_OTHER): Payer: Medicare Other | Admitting: Cardiology

## 2011-10-17 VITALS — BP 138/70 | HR 59 | Ht 72.0 in | Wt 255.0 lb

## 2011-10-17 DIAGNOSIS — Z7901 Long term (current) use of anticoagulants: Secondary | ICD-10-CM

## 2011-10-17 DIAGNOSIS — I5042 Chronic combined systolic (congestive) and diastolic (congestive) heart failure: Secondary | ICD-10-CM

## 2011-10-17 DIAGNOSIS — E119 Type 2 diabetes mellitus without complications: Secondary | ICD-10-CM

## 2011-10-17 DIAGNOSIS — I10 Essential (primary) hypertension: Secondary | ICD-10-CM

## 2011-10-17 DIAGNOSIS — E785 Hyperlipidemia, unspecified: Secondary | ICD-10-CM

## 2011-10-17 DIAGNOSIS — I4892 Unspecified atrial flutter: Secondary | ICD-10-CM

## 2011-10-17 NOTE — Patient Instructions (Addendum)
Your physician recommends that you schedule a follow-up appointment in:  1 year  Your physician has recommended you make the following change in your medication: STOP Pradaxa

## 2011-10-17 NOTE — Assessment & Plan Note (Signed)
CHF was related to arrhythmia and resolved following control of that process.

## 2011-10-17 NOTE — Assessment & Plan Note (Signed)
After more than 6 months without a clinical recurrence of arrhythmia, I would be inclined to discontinue anticoagulation.  I have tentatively advised patient to do so, but will seek Dr. Tanna Furry opinion before finalizing a decision.

## 2011-10-17 NOTE — Assessment & Plan Note (Addendum)
Lipid profile remains good with substantially lower triglycerides than 1 year ago, but also a slightly lower HDL level and a higher LDL level.  Current therapy will be continued.

## 2011-10-17 NOTE — Assessment & Plan Note (Addendum)
Diabetic control is improved resulting in lower triglycerides.  A1c remains slightly suboptimal.

## 2011-10-17 NOTE — Assessment & Plan Note (Signed)
Blood pressure control is good although systolics occasionally slightly exceed the optimal value of less than 135 mmHg.  Current medication will be continued.

## 2011-10-17 NOTE — Progress Notes (Signed)
Patient ID: John [name], male   DOB: [birth year], 66 y.o.   MRN: [id number]  HPI: Scheduled return visit for this very nice gentleman now 8 months following apparently successful radiofrequency ablation for atrial flutter.  In the interim, he has lost a total of 75 pounds and has been very active, including using a chain saw, without cardiopulmonary symptoms.  His only medical problem was associated with use of a chainsaw and involved right chest trauma resulting in severe soft tissue injury but no fracture.  Only mild residual symptoms are now present.  Patient reports excellent control of hypertension with systolics typically around 010 and diastolics less than 80.  Likewise, diabetic control is improved.  CBGs at home are always less than 150, and hemoglobin A1c has decreased from in excess of 8 to recent value of 7.3.  Prior to Admission medications   Medication Sig Start Date End Date Taking? Authorizing Provider  amLODipine (NORVASC) 10 MG tablet Take 1 tablet (10 mg total) by mouth daily. 06/19/11 06/18/12 Yes Yehuda Savannah, MD  atorvastatin (LIPITOR) 80 MG tablet TAKE 1 TABLET DAILY 05/28/11  Yes Yehuda Savannah, MD  benazepril (LOTENSIN) 40 MG tablet Take 1 tablet (40 mg total) by mouth daily. 12/28/10  Yes Yehuda Savannah, MD  glyBURIDE (DIABETA) 5 MG tablet Take 10 mg by mouth 2 (two) times daily with a meal.   12/04/10  Yes Yehuda Savannah, MD  hydrochlorothiazide (HYDRODIURIL) 25 MG tablet Take 1 tablet (25 mg total) by mouth daily. 09/21/11  Yes Yehuda Savannah, MD  metoprolol succinate (TOPROL-XL) 50 MG 24 hr tablet Take 1 tablet (50 mg total) by mouth daily. Take with or immediately following a meal. 06/19/11 06/18/12 Yes Yehuda Savannah, MD  sitaGLIPtan-metformin (JANUMET) 50-1000 MG per tablet Take 1 tablet by mouth 2 (two) times daily with a meal.     Yes Historical Provider, MD  tadalafil (CIALIS) 20 MG tablet Take 20 mg by mouth daily as needed. For erectile dysfunction    Yes Historical Provider, MD  No Known Allergies    Past medical history, social history, and family history reviewed and updated.  ROS: Denies Dyspnea, orthopnea, PND, palpitations, lightheadedness or syncope.  All other systems reviewed and are negative.  PHYSICAL EXAM: BP 138/70  Pulse 59  Ht 6' (1.829 m)  Wt 115.667 kg (255 lb)  BMI 34.58 kg/m2  SpO2 99%  General-Well developed; no acute distress Body habitus-Overweight Neck-No JVD; no carotid bruits Lungs-clear lung fields; resonant to percussion Cardiovascular-normal PMI; normal S1 and S2; fourth heart sound present Abdomen-normal bowel sounds; soft and non-tender without masses or organomegaly Musculoskeletal-No deformities, no cyanosis or clubbing Neurologic-Normal cranial nerves; symmetric strength and tone Skin-Warm, no significant lesions Extremities-distal pulses intact; Trace edema  Rhythm Strip: sinus rhythm at a rate of 59 bpm, borderline first-degree AV block, frequent PACs.  ASSESSMENT AND PLAN:  Jacqulyn Ducking, MD 10/17/2011 12:16 PM

## 2011-10-17 NOTE — Progress Notes (Deleted)
Name: John [name]    DOB: September 18, 1945  Age: 66 y.o.  MR#: [id number]       PCP:  Rubbie Battiest, MD      Insurance: @PAYORNAME @   CC:    Chief Complaint  Patient presents with  . Hyperlipidemia    No complaints - Med List - TC  . Hypertension  . Atrial Flutter    VS BP 138/70  Pulse 59  Ht 6' (1.829 m)  Wt 255 lb (115.667 kg)  BMI 34.58 kg/m2  SpO2 99%  Weights Current Weight  10/17/11 255 lb (115.667 kg)  07/17/11 260 lb (117.935 kg)  06/19/11 265 lb (120.203 kg)    Blood Pressure  BP Readings from Last 3 Encounters:  10/17/11 138/70  07/17/11 134/63  06/19/11 150/71     Admit date:  (Not on file) Last encounter with RMR:  09/21/2011   Allergy No Known Allergies  Current Outpatient Prescriptions  Medication Sig Dispense Refill  . amLODipine (NORVASC) 10 MG tablet Take 1 tablet (10 mg total) by mouth daily.  90 tablet  3  . atorvastatin (LIPITOR) 80 MG tablet TAKE 1 TABLET DAILY  90 tablet  1  . benazepril (LOTENSIN) 40 MG tablet Take 1 tablet (40 mg total) by mouth daily.  90 tablet  3  . dabigatran (PRADAXA) 150 MG CAPS Take 1 capsule (150 mg total) by mouth every 12 (twelve) hours.  60 capsule  12  . glyBURIDE (DIABETA) 5 MG tablet Take 10 mg by mouth 2 (two) times daily with a meal.        . hydrochlorothiazide (HYDRODIURIL) 25 MG tablet Take 1 tablet (25 mg total) by mouth daily.  90 tablet  1  . metoprolol succinate (TOPROL-XL) 50 MG 24 hr tablet Take 1 tablet (50 mg total) by mouth daily. Take with or immediately following a meal.  90 tablet  3  . sitaGLIPtan-metformin (JANUMET) 50-1000 MG per tablet Take 1 tablet by mouth 2 (two) times daily with a meal.        . tadalafil (CIALIS) 20 MG tablet Take 20 mg by mouth daily as needed. For erectile dysfunction      . DISCONTD: amLODipine (NORVASC) 10 MG tablet Take 1 tablet (10 mg total) by mouth daily.  90 tablet  3  . DISCONTD: hydrALAZINE (APRESOLINE) 25 MG tablet Take 1 tablet (25 mg total) by mouth 3 (three)  times daily.  270 tablet  3    Discontinued Meds:   There are no discontinued medications.  Patient Active Problem List  Diagnosis  . DIABETES MELLITUS, TYPE II  . HYPERLIPIDEMIA  . HYPERTENSION  . DEGENERATIVE JOINT DISEASE  . Atrial flutter  . Chronic combined systolic and diastolic heart failure  . Chronic anticoagulation    LABS No visits with results within 3 Month(s) from this visit. Latest known visit with results is:  Admission on 02/07/2011, Discharged on 02/08/2011  Component Date Value  . Glucose-Capillary 02/07/2011 196*  . Glucose-Capillary 02/07/2011 147*  . Glucose-Capillary 02/07/2011 176*  . Glucose-Capillary 02/07/2011 311*  . Comment 1 02/07/2011 Notify RN   . Glucose-Capillary 02/08/2011 235*  . Glucose-Capillary 02/08/2011 148*  . Glucose-Capillary 02/08/2011 160*     Results for this Opt Visit:     Results for orders placed during the hospital encounter of 02/07/11  GLUCOSE, CAPILLARY      Component Value Range   Glucose-Capillary 196 (*) 70 - 99 mg/dL  GLUCOSE, CAPILLARY  Component Value Range   Glucose-Capillary 147 (*) 70 - 99 mg/dL  GLUCOSE, CAPILLARY      Component Value Range   Glucose-Capillary 176 (*) 70 - 99 mg/dL  GLUCOSE, CAPILLARY      Component Value Range   Glucose-Capillary 311 (*) 70 - 99 mg/dL   Comment 1 Notify RN    GLUCOSE, CAPILLARY      Component Value Range   Glucose-Capillary 235 (*) 70 - 99 mg/dL  GLUCOSE, CAPILLARY      Component Value Range   Glucose-Capillary 148 (*) 70 - 99 mg/dL  GLUCOSE, CAPILLARY      Component Value Range   Glucose-Capillary 160 (*) 70 - 99 mg/dL    EKG Orders placed during the hospital encounter of 05/10/11  . HOLTER MONITOR - 48 HOUR  . HOLTER MONITOR - 48 HOUR  . HOLTER MONITOR - 48 HOUR  . HOLTER MONITOR - 9 HOUR     Prior Assessment and Plan Problem List as of 10/17/2011            Cardiology Problems   HYPERLIPIDEMIA   Last Assessment & Plan Note   06/19/2011  Office Visit Addendum 06/22/2011  3:59 PM by Yehuda Savannah, MD    Patient is maintained on high dose atorvastatin resulting in extremely low total and LDL cholesterol.  Better control of diabetes will likely further reduce triglycerides and possibly increased HDL.    HYPERTENSION   Last Assessment & Plan Note   06/19/2011 Office Visit Addendum 06/22/2011  4:00 PM by Yehuda Savannah, MD    Blood pressure control has been generally good at home, but is suboptimal today.  In addition, patient wishes to resume use of Cialis.  Hydralazine is likely contributing little and will be discontinued.  Dose of amlodipine will be doubled.  Metoprolol will be added at a total daily dose of 50 mg and Cardura will be discontinued.  Patient will monitor blood pressure at home and return for reassessment by the cardiology nurses.    Atrial flutter   Last Assessment & Plan Note   06/19/2011 Office Visit Signed 06/19/2011  3:41 PM by Yehuda Savannah, MD    No recurrence of sustained atrial arrhythmias; supraventricular tachycardia lasting fewer than 30 seconds documented by Holter without symptoms.  Beta blocker will be added to his medical regime in an attempt to further suppress arrhythmia.  The likelihood of recurrent atrial flutter is uncertain.    Chronic combined systolic and diastolic heart failure   Last Assessment & Plan Note   06/19/2011 Office Visit Signed 06/19/2011  3:42 PM by Yehuda Savannah, MD    Left ventricular dysfunction presumed secondary to tachycardia.  Echocardiography will be repeated to verify that LV systolic function has normalized.      Other   DIABETES MELLITUS, TYPE II   Last Assessment & Plan Note   05/04/2011 Office Visit Signed 05/05/2011  9:07 AM by Yehuda Savannah, MD    Suboptimal control.  Dr. Wolfgang Phoenix may wish to adjust pharmacologic therapy.    DEGENERATIVE JOINT DISEASE   Chronic anticoagulation   Last Assessment & Plan Note   06/19/2011 Office Visit Signed 06/19/2011   3:42 PM by Yehuda Savannah, MD    Patient continues on dabigatran without adverse effects.  Due to continuing, albeit brief, arrhythmias and left atrial thrombus by transesophageal echocardiography, I am inclined to continue full anticoagulation for a total of at least one year.  Imaging: No results found.   FRS Calculation: Score not calculated. Missing: Total Cholesterol

## 2011-10-17 NOTE — Assessment & Plan Note (Signed)
Patient reports no symptoms to suggest recurrent arrhythmia.

## 2011-11-07 ENCOUNTER — Other Ambulatory Visit: Payer: Self-pay | Admitting: Cardiology

## 2012-04-07 ENCOUNTER — Other Ambulatory Visit: Payer: Self-pay | Admitting: Cardiology

## 2012-04-07 ENCOUNTER — Telehealth: Payer: Self-pay | Admitting: Cardiology

## 2012-04-07 MED ORDER — HYDROCHLOROTHIAZIDE 25 MG PO TABS: 25.0000 mg | ORAL_TABLET | Freq: Every day | ORAL | Status: DC

## 2012-04-07 NOTE — Telephone Encounter (Signed)
Informed pt that refill request for HCTZ 25 MG [facility]

## 2012-04-07 NOTE — Telephone Encounter (Signed)
Patient needs refill of HCTZ, 25 mg.  90 day supply.

## 2012-04-15 ENCOUNTER — Other Ambulatory Visit: Payer: Self-pay | Admitting: *Deleted

## 2012-04-15 ENCOUNTER — Other Ambulatory Visit: Payer: Self-pay | Admitting: Cardiology

## 2012-04-15 MED ORDER — HYDROCHLOROTHIAZIDE 25 MG PO TABS: 25.0000 mg | ORAL_TABLET | Freq: Every day | ORAL | Status: DC

## 2012-04-17 ENCOUNTER — Telehealth: Payer: Self-pay | Admitting: Cardiology

## 2012-04-17 ENCOUNTER — Other Ambulatory Visit: Payer: Self-pay | Admitting: Cardiology

## 2012-04-17 MED ORDER — HYDROCHLOROTHIAZIDE 25 MG PO TABS: 25.0000 mg | ORAL_TABLET | Freq: Every day | ORAL | Status: DC

## 2012-04-17 MED ORDER — AMLODIPINE BESYLATE 10 MG PO TABS: 10.0000 mg | ORAL_TABLET | Freq: Every day | ORAL | Status: DC

## 2012-04-17 MED ORDER — ATORVASTATIN CALCIUM 80 MG PO TABS: 80.0000 mg | ORAL_TABLET | Freq: Every day | ORAL | Status: DC

## 2012-04-17 MED ORDER — TADALAFIL 20 MG PO TABS: 20.0000 mg | ORAL_TABLET | Freq: Every day | ORAL | Status: DC | PRN

## 2012-04-17 MED ORDER — BENAZEPRIL HCL 40 MG PO TABS: 40.0000 mg | ORAL_TABLET | Freq: Every day | ORAL | Status: DC

## 2012-04-17 MED ORDER — METOPROLOL SUCCINATE ER 50 MG PO TB24: 50.0000 mg | ORAL_TABLET | Freq: Every day | ORAL | Status: DC

## 2012-04-17 NOTE — Telephone Encounter (Signed)
Patient needs all his meds switched to [facility] in [place].   Also needs to make sure Hydrocholorathiazide that was sent to to [facility] get [facility].  He is out of this med.  And also change the pharmacy in his chart to [facility] [place]. / tg

## 2012-05-05 ENCOUNTER — Other Ambulatory Visit: Payer: Self-pay | Admitting: Cardiology

## 2012-05-15 ENCOUNTER — Other Ambulatory Visit: Payer: Self-pay | Admitting: Emergency Medicine

## 2012-05-15 MED ORDER — METOPROLOL SUCCINATE ER 50 MG PO TB24: 50.0000 mg | ORAL_TABLET | Freq: Every day | ORAL | Status: DC

## 2012-05-15 MED ORDER — AMLODIPINE BESYLATE 10 MG PO TABS: 10.0000 mg | ORAL_TABLET | Freq: Every day | ORAL | Status: DC

## 2012-07-14 ENCOUNTER — Ambulatory Visit (INDEPENDENT_AMBULATORY_CARE_PROVIDER_SITE_OTHER): Payer: Medicare PPO | Admitting: Adult Health

## 2012-07-14 ENCOUNTER — Ambulatory Visit (HOSPITAL_COMMUNITY)
Admission: RE | Admit: 2012-07-14 | Discharge: 2012-07-14 | Disposition: A | Discharge status: Home / Self Care | Payer: Medicare PPO | Source: Ambulatory Visit | Attending: Adult Health | Admitting: Adult Health

## 2012-07-14 ENCOUNTER — Encounter: Payer: Self-pay | Admitting: Adult Health

## 2012-07-14 ENCOUNTER — Ambulatory Visit: Payer: Medicare Other | Admitting: Adult Health

## 2012-07-14 VITALS — BP 150/71 | Ht 72.0 in | Wt 252.9 lb

## 2012-07-14 DIAGNOSIS — IMO0002 Reserved for concepts with insufficient information to code with codable children: Secondary | ICD-10-CM | POA: Insufficient documentation

## 2012-07-14 DIAGNOSIS — M25519 Pain in unspecified shoulder: Secondary | ICD-10-CM | POA: Insufficient documentation

## 2012-07-14 DIAGNOSIS — M25511 Pain in right shoulder: Secondary | ICD-10-CM | POA: Insufficient documentation

## 2012-07-14 DIAGNOSIS — I10 Essential (primary) hypertension: Secondary | ICD-10-CM

## 2012-07-14 DIAGNOSIS — I4892 Unspecified atrial flutter: Secondary | ICD-10-CM

## 2012-07-14 DIAGNOSIS — R0789 Other chest pain: Secondary | ICD-10-CM | POA: Insufficient documentation

## 2012-07-14 DIAGNOSIS — W19XXXA Unspecified fall, initial encounter: Secondary | ICD-10-CM | POA: Insufficient documentation

## 2012-07-14 NOTE — Progress Notes (Signed)
HPI: John [name] is a 67 y/o patient of Dr.Rothbart we are seeing for ongoing assessment and management of atrial flutters/p RA ablation in July of 2013, hypertension, with history of diabetes. He has a with complaints of right upper chest discomfort after falling. He states that there was a mild area of swelling which has since dissipated but he remains sore with any range of movement. He was convinced that his radiofrequency ablation was completed using the right subclavian artery area. He was concerned that there was some damage where the insertion site is.  He denies dizziness, bleeding, chest pain or shortness of breath.  No Known Allergies  Current Outpatient Prescriptions  Medication Sig Dispense Refill  . amLODipine (NORVASC) 10 MG tablet Take 1 tablet (10 mg total) by mouth daily.  90 tablet  1  . atorvastatin (LIPITOR) 80 MG tablet Take 1 tablet (80 mg total) by mouth daily.  90 tablet  2  . benazepril (LOTENSIN) 40 MG tablet Take 1 tablet (40 mg total) by mouth daily.  90 tablet  2  . glyBURIDE (DIABETA) 5 MG tablet Take 10 mg by mouth 2 (two) times daily with a meal.        . hydrochlorothiazide (HYDRODIURIL) 25 MG tablet Take 1 tablet (25 mg total) by mouth daily.  90 tablet  2  . metoprolol succinate (TOPROL-XL) 50 MG 24 hr tablet Take 1 tablet (50 mg total) by mouth daily. Take with or immediately following a meal.  90 tablet  1  . sitaGLIPtan-metformin (JANUMET) 50-1000 MG per tablet Take 1 tablet by mouth 2 (two) times daily with a meal.        . tadalafil (CIALIS) 20 MG tablet Take 1 tablet (20 mg total) by mouth daily as needed. For erectile dysfunction  10 tablet  6  . [DISCONTINUED] hydrALAZINE (APRESOLINE) 25 MG tablet Take 1 tablet (25 mg total) by mouth 3 (three) times daily.  270 tablet  3   No current facility-administered medications for this visit.    Past Medical History  Diagnosis Date  . Osteoarthritis   . Diabetes mellitus     non insulin dependant  .  Hypertension   . Hyperlipidemia   . Chronic combined systolic and diastolic heart failure     echo 01/06/11: mild LVH, EF 65-70%, mod to severe LAE, mild RVE, mild RAE, PASP 32;   TEE 10/12: EF 45-50%   . Atrial flutter 12/2010    Admitted with symptomatic bradycardia (HR 40s), atrial flutter with slow ventricular response 12/2010 + volume overload; AV nodal agents d/c'd and Pradaxa started; RFA in 01/2011  . PSVT (paroxysmal supraventricular tachycardia)     Possibly atrial flutter    Past Surgical History  Procedure Laterality Date  . Knee arthroscopy  2005    left  . Lumbar spine surgery      "I've had 6 ORs 1972 thru 2004"  . Cardiac electrophysiology study and ablation  02/07/11    JQB:HALPFX of systems complete and found to be negative unless listed above  PHYSICAL EXAM BP 150/71  Ht 6' (1.829 m)  Wt 252 lb 15 oz (114.733 kg)  BMI 34.3 kg/m2  General: Well developed, well nourished, in no acute distress Head: Eyes PERRLA, No xanthomas.   Normal cephalic and atramatic  Lungs: Clear bilaterally to auscultation and percussion. Heart: HRRR S1 S2, without MRG.  Pulses are 2+ & equal.            No carotid bruit.  No JVD.  No abdominal bruits. No femoral bruits. Abdomen: Bowel sounds are positive, abdomen soft and non-tender without masses or                  Hernia's noted. Msk:  Back normal, normal gait. Normal strength and tone for age. Pain with ROM of the right arm and shoulder, and with palpation of clavicle and right shoulder.. Extremities: No clubbing, cyanosis or edema.  DP +1 Neuro: Alert and oriented X 3. Psych:  Good affect, responds appropriately  EKG: Normal sinus rhythm sinus bradycardia rate of 55 beats per minute with a first-degree AV block.  ASSESSMENT AND PLAN

## 2012-07-14 NOTE — Assessment & Plan Note (Signed)
Mildly elevated this visit. We will not make any changes at this time as the patient is complaining of pain in the right shoulder which is probably contributing. This will followup with [place] in a previously scheduled appointment in approximately 6 months.

## 2012-07-14 NOTE — Patient Instructions (Addendum)
Schedule rib and rt clavicle xrays   See [place]   Your physician wants you to follow-up in: 6 months. You will receive a reminder letter in the mail two months in advance. If you don't receive a letter, please call our office to schedule the follow-up appointment.

## 2012-07-14 NOTE — Addendum Note (Signed)
Addended by: Golden Hurter D on: 07/14/2012 02:09 PM   Modules accepted: Orders

## 2012-07-14 NOTE — Assessment & Plan Note (Addendum)
He remains in normal sinus rhythm. He will continue his current medication regimen includes metoprolol 50 mg daily. He is no longer on anticoagulation, reducing the risks of bleeding issue from his right shoulder injury. No hematoma was found.

## 2012-07-14 NOTE — Assessment & Plan Note (Signed)
Pain is elicited with palpation of the clavicle and upper right chest. Will have her studies completed to rule out fracture, and half clavicle evaluated. This appears to be more of a rotator cuff injury or strain. He is having pain with range of movement, and palpation of the right shoulder area. Advised to followup with his primary care physician for further evaluation and possible MRI of the right shoulder. He has been given reassurance if this does not appear to be a cardiac issue.

## 2012-09-18 ENCOUNTER — Encounter: Payer: Self-pay | Admitting: Cardiology

## 2012-09-18 ENCOUNTER — Ambulatory Visit (INDEPENDENT_AMBULATORY_CARE_PROVIDER_SITE_OTHER): Payer: Medicare PPO | Admitting: Cardiology

## 2012-09-18 VITALS — BP 132/64 | HR 60 | Ht 72.0 in | Wt 242.0 lb

## 2012-09-18 DIAGNOSIS — I4892 Unspecified atrial flutter: Secondary | ICD-10-CM

## 2012-09-18 DIAGNOSIS — Z7901 Long term (current) use of anticoagulants: Secondary | ICD-10-CM

## 2012-09-18 DIAGNOSIS — E785 Hyperlipidemia, unspecified: Secondary | ICD-10-CM

## 2012-09-18 DIAGNOSIS — I10 Essential (primary) hypertension: Secondary | ICD-10-CM

## 2012-09-18 DIAGNOSIS — E119 Type 2 diabetes mellitus without complications: Secondary | ICD-10-CM

## 2012-09-18 NOTE — Assessment & Plan Note (Addendum)
No symptoms to suggest recurrent atrial arrhythmias.  Likelihood of recurrence of atrial flutter is low, although a new atrial arrhythmia may develop. Periodic routine cardiology assessment will not be necessary. Mr. Mulkern should return to [place] if he experiences additional arrhythmias or other cardiac problems with which we can assist.

## 2012-09-18 NOTE — Assessment & Plan Note (Signed)
Excellent response to high-dose atorvastatin. Additional assessments of serum lipids probably will not be necessary.

## 2012-09-18 NOTE — Progress Notes (Signed)
Patient ID: John [name], male   DOB: 02-23-1946, 67 y.o.   MRN: [id number]  HPI: Scheduled return visit for continued assessment and treatment of atrial flutter and hypertension. John [name] has done extremely well since radiofrequency ablation 18 months ago. He has had no recurrent arrhythmias, and control of hypertension has been excellent. He has no cardiopulmonary symptoms.  Current Outpatient Prescriptions  Medication Sig Dispense Refill  . amLODipine (NORVASC) 10 MG tablet Take 1 tablet (10 mg total) by mouth daily.  90 tablet  1  . atorvastatin (LIPITOR) 80 MG tablet Take 1 tablet (80 mg total) by mouth daily.  90 tablet  2  . benazepril (LOTENSIN) 40 MG tablet Take 1 tablet (40 mg total) by mouth daily.  90 tablet  2  . glyBURIDE (DIABETA) 5 MG tablet Take 10 mg by mouth 2 (two) times daily with a meal.        . hydrochlorothiazide (HYDRODIURIL) 25 MG tablet Take 1 tablet (25 mg total) by mouth daily.  90 tablet  2  . metoprolol succinate (TOPROL-XL) 50 MG 24 hr tablet Take 1 tablet (50 mg total) by mouth daily. Take with or immediately following a meal.  90 tablet  1  . sitaGLIPtan-metformin (JANUMET) 50-1000 MG per tablet Take 1 tablet by mouth 2 (two) times daily with a meal.        . tadalafil (CIALIS) 20 MG tablet Take 1 tablet (20 mg total) by mouth daily as needed. For erectile dysfunction  10 tablet  6  . [DISCONTINUED] hydrALAZINE (APRESOLINE) 25 MG tablet Take 1 tablet (25 mg total) by mouth 3 (three) times daily.  270 tablet  3   No current facility-administered medications for this visit.   No Known Allergies   Past medical history, social history, and family history reviewed and updated.  ROS: Denies chest pain, dyspnea, palpitations, lightheadedness or syncope. All other systems reviewed and are negative.  PHYSICAL EXAM: BP 132/64  Pulse 60  Ht 6' (1.829 m)  Wt 109.77 kg (242 lb)  BMI 32.81 kg/m2;  Body mass index is 32.81 kg/(m^2). General-Well developed; no  acute distress Body habitus-mildly overweight Neck-No JVD; no carotid bruits Lungs-clear lung fields; resonant to percussion Cardiovascular-normal PMI; normal S1 and S2; S4 present Abdomen-normal bowel sounds; soft and non-tender without masses or organomegaly Musculoskeletal-No deformities, no cyanosis or clubbing Neurologic-Normal cranial nerves; symmetric strength and tone Skin-Warm, no significant lesions Extremities-distal pulses intact; no edema  Jacqulyn Ducking, MD 09/18/2012  2:56 PM  ASSESSMENT AND PLAN

## 2012-09-18 NOTE — Progress Notes (Deleted)
Name: John [name]    DOB: 07/17/1945  Age: 67 y.o.  MR#: [id number]       PCP:  Rubbie Battiest, MD      Insurance: Payor: [employer]ICARE / Plan: [employer] MED[employer]ARE CHOICE PPO / Product Type: *No Product type* /   CC:   No chief complaint on file. no list  VS Filed Vitals:   09/18/12 1418  BP: 132/64  Pulse: 60  Height: 6' (1.829 m)  Weight: 242 lb (109.77 kg)    Weights Current Weight  09/18/12 242 lb (109.77 kg)  07/14/12 252 lb 15 oz (114.733 kg)  10/17/11 255 lb (115.667 kg)    Blood Pressure  BP Readings from Last 3 Encounters:  09/18/12 132/64  07/14/12 150/71  10/17/11 138/70     Admit date:  (Not on file) Last encounter with RMR:  05/05/2012   Allergy Review of patient's allergies indicates no known allergies.  Current Outpatient Prescriptions  Medication Sig Dispense Refill  . amLODipine (NORVASC) 10 MG tablet Take 1 tablet (10 mg total) by mouth daily.  90 tablet  1  . atorvastatin (LIPITOR) 80 MG tablet Take 1 tablet (80 mg total) by mouth daily.  90 tablet  2  . benazepril (LOTENSIN) 40 MG tablet Take 1 tablet (40 mg total) by mouth daily.  90 tablet  2  . glyBURIDE (DIABETA) 5 MG tablet Take 10 mg by mouth 2 (two) times daily with a meal.        . hydrochlorothiazide (HYDRODIURIL) 25 MG tablet Take 1 tablet (25 mg total) by mouth daily.  90 tablet  2  . metoprolol succinate (TOPROL-XL) 50 MG 24 hr tablet Take 1 tablet (50 mg total) by mouth daily. Take with or immediately following a meal.  90 tablet  1  . sitaGLIPtan-metformin (JANUMET) 50-1000 MG per tablet Take 1 tablet by mouth 2 (two) times daily with a meal.        . tadalafil (CIALIS) 20 MG tablet Take 1 tablet (20 mg total) by mouth daily as needed. For erectile dysfunction  10 tablet  6  . [DISCONTINUED] hydrALAZINE (APRESOLINE) 25 MG tablet Take 1 tablet (25 mg total) by mouth 3 (three) times daily.  270 tablet  3   No current facility-administered medications for this visit.    Discontinued Meds:    There are no discontinued medications.  Patient Active Problem List   Diagnosis Date Noted  . Right shoulder pain 07/14/2012  . Chronic anticoagulation 05/04/2011  . Chronic combined systolic and diastolic heart failure 16/03/930  . Atrial flutter 01/05/2011  . DIABETES MELLITUS, TYPE II 12/23/2009  . HYPERLIPIDEMIA 12/23/2009  . HYPERTENSION 12/23/2009  . DEGENERATIVE JOINT DISEASE 12/23/2009    LABS    Component Value Date/Time   NA 139 01/31/2011 1721   NA 138 01/17/2011 1545   NA 139 01/05/2011 1546   K 4.1 01/31/2011 1721   K 3.9 01/17/2011 1545   K 4.0 01/05/2011 1546   CL 104 01/31/2011 1721   CL 104 01/17/2011 1545   CL 102 01/05/2011 1546   CO2 22 01/31/2011 1721   CO2 23 01/17/2011 1545   CO2 26 01/05/2011 1546   GLUCOSE 119* 01/31/2011 1721   GLUCOSE 76 01/17/2011 1545   GLUCOSE 152* 01/05/2011 1546   BUN 16 01/31/2011 1721   BUN 14 01/17/2011 1545   BUN 13 01/05/2011 1546   CREATININE 1.10 01/31/2011 1721   CREATININE 0.9 01/17/2011 1545   CREATININE 0.86 01/05/2011  1546   CREATININE 0.87 05/13/2010 2110   CALCIUM 9.2 01/31/2011 1721   CALCIUM 9.3 01/17/2011 1545   CALCIUM 9.6 01/05/2011 1546   GFRNONAA 89* 01/05/2011 1546   GFRNONAA >60 06/27/2007 1420   GFRAA >90 01/05/2011 1546   GFRAA  Value: >60        The eGFR has been calculated using the MDRD equation. This calculation has not been validated in all clinical 06/27/2007 1420   CMP     Component Value Date/Time   NA 139 01/31/2011 1721   K 4.1 01/31/2011 1721   CL 104 01/31/2011 1721   CO2 22 01/31/2011 1721   GLUCOSE 119* 01/31/2011 1721   BUN 16 01/31/2011 1721   CREATININE 1.10 01/31/2011 1721   CREATININE 0.9 01/17/2011 1545   CALCIUM 9.2 01/31/2011 1721   PROT 6.5 01/05/2011 1546   ALBUMIN 3.3* 01/05/2011 1546   AST 18 01/05/2011 1546   ALT 21 01/05/2011 1546   ALKPHOS 52 01/05/2011 1546   BILITOT 0.7 01/05/2011 1546   GFRNONAA 89* 01/05/2011 1546   GFRAA >90 01/05/2011 1546       Component  Value Date/Time   WBC 9.4 01/05/2011 1546   WBC 7.8 06/27/2007 1420   HGB 12.8* 01/05/2011 1546   HGB 14.1 06/27/2007 1420   HCT 37.5* 01/05/2011 1546   HCT 41.9 06/27/2007 1420   MCV 84.7 01/05/2011 1546   MCV 87.1 06/27/2007 1420    Lipid Panel     Component Value Date/Time   CHOL 103 01/06/2011 0500   TRIG 138 01/06/2011 0500   HDL 52 01/06/2011 0500   CHOLHDL 2.0 01/06/2011 0500   VLDL 28 01/06/2011 0500   LDLCALC 23 01/06/2011 0500    ABG No results found for this basename: phart, pco2, pco2art, po2, po2art, hco3, tco2, acidbasedef, o2sat     Lab Results  Component Value Date   TSH 1.206 01/05/2011   BNP (last 3 results) No results found for this basename: PROBNP,  in the last 8760 hours Cardiac Panel (last 3 results) No results found for this basename: CKTOTAL, CKMB, TROPONINI, RELINDX,  in the last 72 hours  Iron/TIBC/Ferritin No results found for this basename: iron, tibc, ferritin     EKG Orders placed in visit on 07/14/12  . EKG 12-LEAD     Prior Assessment and Plan Problem List as of 09/18/2012   DIABETES MELLITUS, TYPE II   Last Assessment & Plan   10/17/2011 Office Visit Edited 10/17/2011 12:26 PM by Yehuda Savannah, MD     Diabetic control is improved resulting in lower triglycerides.  A1c remains slightly suboptimal.    HYPERLIPIDEMIA   Last Assessment & Plan   10/17/2011 Office Visit Edited 10/21/2011  5:06 PM by Yehuda Savannah, MD     Lipid profile remains good with substantially lower triglycerides than 1 year ago, but also a slightly lower HDL level and a higher LDL level.  Current therapy will be continued.    HYPERTENSION   Last Assessment & Plan   07/14/2012 Office Visit Written 07/14/2012  1:42 PM by Lendon Colonel, NP     Mildly elevated this visit. We will not make any changes at this time as the patient is complaining of pain in the right shoulder which is probably contributing. This will followup with [place] in a previously scheduled  appointment in approximately 6 months.    DEGENERATIVE JOINT DISEASE   Atrial flutter   Last Assessment & Plan   07/14/2012  Office Visit Edited 07/14/2012  1:42 PM by Lendon Colonel, NP     He remains in normal sinus rhythm. He will continue his current medication regimen includes metoprolol 50 mg daily. He is no longer on anticoagulation, reducing the risks of bleeding issue from his right shoulder injury. No hematoma was found.    Chronic combined systolic and diastolic heart failure   Last Assessment & Plan   10/17/2011 Office Visit Written 10/17/2011 12:22 PM by Yehuda Savannah, MD     CHF was related to arrhythmia and resolved following control of that process.    Chronic anticoagulation   Last Assessment & Plan   10/17/2011 Office Visit Written 10/17/2011 12:21 PM by Yehuda Savannah, MD     After more than 6 months without a clinical recurrence of arrhythmia, I would be inclined to discontinue anticoagulation.  I have tentatively advised patient to do so, but will seek Dr. Tanna Furry opinion before finalizing a decision.    Right shoulder pain   Last Assessment & Plan   07/14/2012 Office Visit Written 07/14/2012  1:41 PM by Lendon Colonel, NP     Pain is elicited with palpation of the clavicle and upper right chest. Will have her studies completed to rule out fracture, and half clavicle evaluated. This appears to be more of a rotator cuff injury or strain. He is having pain with range of movement, and palpation of the right shoulder area. Advised to followup with his primary care physician for further evaluation and possible MRI of the right shoulder. He has been given reassurance if this does not appear to be a cardiac issue.        Imaging: No results found.

## 2012-09-18 NOTE — Assessment & Plan Note (Signed)
Progressive improvement in diabetic control under the guidance of Dr. Jeanann Lewandowsky. Recent laboratory results will be requested for review.

## 2012-09-18 NOTE — Assessment & Plan Note (Signed)
Blood pressure control has been excellent with current medication, which will be continued.

## 2012-09-18 NOTE — Patient Instructions (Addendum)
Your physician recommends that you schedule a follow-up appointment in: AS NEEDED  

## 2012-10-15 ENCOUNTER — Other Ambulatory Visit: Payer: Self-pay | Admitting: Cardiology

## 2012-10-15 ENCOUNTER — Ambulatory Visit (INDEPENDENT_AMBULATORY_CARE_PROVIDER_SITE_OTHER): Payer: 59 | Admitting: Family Medicine

## 2012-10-15 ENCOUNTER — Encounter: Payer: Self-pay | Admitting: Family Medicine

## 2012-10-15 VITALS — BP 128/70 | HR 70 | Ht 70.5 in | Wt 252.0 lb

## 2012-10-15 DIAGNOSIS — Z125 Encounter for screening for malignant neoplasm of prostate: Secondary | ICD-10-CM

## 2012-10-15 DIAGNOSIS — Z0000 Encounter for general adult medical examination without abnormal findings: Secondary | ICD-10-CM

## 2012-10-15 NOTE — Progress Notes (Signed)
  Subjective:    Patient ID: John [name], male    DOB: July 01, 1945, 67 y.o.   MRN: [id number]  HPI Had a flutter procedure. Handled well the ablation.  Exercising regularly. Walking regularly.  cialis worked the best.  Had shingles vaccine. Pneumonia vaccine 4 years ago.   Review of Systems  Constitutional: Negative for fever, activity change and appetite change.  HENT: Negative for congestion, rhinorrhea and neck pain.   Eyes: Negative for discharge.  Respiratory: Negative for cough and wheezing.   Cardiovascular: Negative for chest pain.  Gastrointestinal: Negative for vomiting, abdominal pain and blood in stool.  Genitourinary: Positive for urgency. Negative for frequency and difficulty urinating.  Musculoskeletal: Positive for back pain and arthralgias.  Skin: Negative for rash.  Allergic/Immunologic: Negative for environmental allergies and food allergies.  Neurological: Negative for weakness and headaches.  Psychiatric/Behavioral: Negative for agitation.       Objective:   Physical Exam  Vitals reviewed. Constitutional: He appears well-developed and well-nourished.  Patient is somewhat overweight though he has a large frame  HENT:  Head: Normocephalic and atraumatic.  Right Ear: External ear normal.  Left Ear: External ear normal.  Nose: Nose normal.  Mouth/Throat: Oropharynx is clear and moist.  Eyes: EOM are normal. Pupils are equal, round, and reactive to light.  Neck: Normal range of motion. Neck supple. No thyromegaly present.  Cardiovascular: Normal rate, regular rhythm and normal heart sounds.   No murmur heard. Pulmonary/Chest: Effort normal and breath sounds normal. No respiratory distress. He has no wheezes.  Abdominal: Soft. Bowel sounds are normal. He exhibits no distension and no mass. There is no tenderness.  Genitourinary: Penis normal.  Musculoskeletal: Normal range of motion. He exhibits no edema.  Lymphadenopathy:    He has no cervical  adenopathy.  Neurological: He is alert. He exhibits normal muscle tone.  Skin: Skin is warm and dry. No erythema.  Psychiatric: He has a normal mood and affect. His behavior is normal. Judgment normal.   Prostate mildly enlarged.       Assessment & Plan:  Wellness exam plan diet exercise discussed. Hemoccult cards. PSA blood test. WSL

## 2012-10-15 NOTE — Progress Notes (Signed)
Clock memory test pass 3 item recall

## 2012-10-16 ENCOUNTER — Ambulatory Visit: Payer: Medicare Other | Admitting: Cardiology

## 2012-10-22 ENCOUNTER — Encounter: Payer: Self-pay | Admitting: Cardiology

## 2012-10-26 LAB — PSA: PSA: 1.21 ng/mL (ref <=4.00)

## 2012-10-27 ENCOUNTER — Encounter: Payer: Self-pay | Admitting: Family Medicine

## 2013-01-09 ENCOUNTER — Other Ambulatory Visit: Payer: Self-pay | Admitting: Cardiology

## 2013-02-24 ENCOUNTER — Telehealth: Payer: Self-pay | Admitting: Adult Health

## 2013-02-24 MED ORDER — AMLODIPINE BESYLATE 10 MG PO TABS: 10.0000 mg | ORAL_TABLET | Freq: Every day | ORAL | Status: DC

## 2013-02-24 MED ORDER — BENAZEPRIL HCL 40 MG PO TABS: 40.0000 mg | ORAL_TABLET | Freq: Every day | ORAL | Status: DC

## 2013-02-24 MED ORDER — METOPROLOL SUCCINATE ER 50 MG PO TB24: 50.0000 mg | ORAL_TABLET | Freq: Every day | ORAL | Status: DC

## 2013-02-24 NOTE — Telephone Encounter (Signed)
Needs refills on Amlodopine, Benazapril, and Metoprolol sent to [facility] in [place] / tgs

## 2013-04-21 ENCOUNTER — Telehealth: Payer: Self-pay | Admitting: Cardiology

## 2013-04-21 NOTE — Telephone Encounter (Signed)
Patient would like all prescriptions sent to [facility] please / [place]

## 2013-04-21 NOTE — Telephone Encounter (Signed)
Noted pt RX sent to [facility] on 02-24-13 however pt last OV with Dr Lattie Haw noted for the pt to follow up as needed, please advise if pt needs to have f/u prior to refill sent to mail order as requested or will pt need to have PCP prescribe medications please advise

## 2013-04-23 MED ORDER — HYDROCHLOROTHIAZIDE 25 MG PO TABS: ORAL_TABLET | ORAL | Status: DC

## 2013-04-23 MED ORDER — ATORVASTATIN CALCIUM 80 MG PO TABS: 80.0000 mg | ORAL_TABLET | Freq: Every day | ORAL | Status: DC

## 2013-04-23 MED ORDER — BENAZEPRIL HCL 40 MG PO TABS: 40.0000 mg | ORAL_TABLET | Freq: Every day | ORAL | Status: DC

## 2013-04-23 MED ORDER — METOPROLOL SUCCINATE ER 50 MG PO TB24: 50.0000 mg | ORAL_TABLET | Freq: Every day | ORAL | Status: DC

## 2013-04-23 NOTE — Telephone Encounter (Signed)
Pt will have all future refills done by pcp as we are only seeing prn now  Pt agreed with dispo

## 2013-04-23 NOTE — Telephone Encounter (Signed)
Refill for one month only.  Remind patient that he will need to see PCP for ongoing Rx refills as we will not be managing. If need for ongoing cardiology treatment, PCP will need to refer.

## 2013-04-23 NOTE — Telephone Encounter (Signed)
Pt is made aware to get medication refills from PCP. Refilled for 1 month courtesy.

## 2013-06-22 ENCOUNTER — Other Ambulatory Visit: Payer: Self-pay | Admitting: Adult Health

## 2013-06-30 ENCOUNTER — Telehealth: Payer: Self-pay | Admitting: Adult Health

## 2013-06-30 ENCOUNTER — Telehealth: Payer: Self-pay

## 2013-06-30 ENCOUNTER — Other Ambulatory Visit: Payer: Self-pay

## 2013-06-30 NOTE — Telephone Encounter (Signed)
PLEASE SEE PAPER IN REFILL BIN FOR CLARIFICATION / [facility]

## 2013-06-30 NOTE — Telephone Encounter (Signed)
[facility] needs completion of refill request by NP to have Metoprolol quantity,refills and signature documented.Will place in providers folder for completion.

## 2013-07-01 ENCOUNTER — Other Ambulatory Visit: Payer: Self-pay | Admitting: *Deleted

## 2013-07-01 MED ORDER — METOPROLOL SUCCINATE ER 50 MG PO TB24: 50.0000 mg | ORAL_TABLET | Freq: Every day | ORAL | Status: DC

## 2013-07-02 ENCOUNTER — Telehealth: Payer: Self-pay | Admitting: Adult Health

## 2013-07-02 NOTE — Telephone Encounter (Signed)
Paper received. Sent in prescription yesterday

## 2013-07-02 NOTE — Telephone Encounter (Signed)
Please see paper in refill bin / tgs

## 2013-07-06 ENCOUNTER — Telehealth: Payer: Self-pay | Admitting: Adult Health

## 2013-07-06 NOTE — Telephone Encounter (Signed)
Done. Medication sent via escribe.

## 2013-07-06 NOTE — Telephone Encounter (Signed)
Please see paper in refill bin / tgs

## 2013-07-07 ENCOUNTER — Other Ambulatory Visit: Payer: Self-pay | Admitting: *Deleted

## 2013-07-07 ENCOUNTER — Telehealth: Payer: Self-pay | Admitting: Adult Health

## 2013-07-07 MED ORDER — METOPROLOL SUCCINATE ER 50 MG PO TB24: 50.0000 mg | ORAL_TABLET | Freq: Every day | ORAL | Status: DC

## 2013-07-07 NOTE — Telephone Encounter (Signed)
Please see paper in refill bin / tgs

## 2013-11-04 ENCOUNTER — Other Ambulatory Visit: Payer: Self-pay | Admitting: *Deleted

## 2013-11-04 MED ORDER — BENAZEPRIL HCL 40 MG PO TABS: 40.0000 mg | ORAL_TABLET | Freq: Every day | ORAL | Status: DC

## 2013-11-16 ENCOUNTER — Ambulatory Visit (INDEPENDENT_AMBULATORY_CARE_PROVIDER_SITE_OTHER): Payer: 59 | Admitting: Family Medicine

## 2013-11-16 ENCOUNTER — Encounter: Payer: Self-pay | Admitting: Family Medicine

## 2013-11-16 VITALS — BP 120/78 | Ht 71.0 in | Wt 253.2 lb

## 2013-11-16 DIAGNOSIS — I483 Typical atrial flutter: Secondary | ICD-10-CM

## 2013-11-16 DIAGNOSIS — Z125 Encounter for screening for malignant neoplasm of prostate: Secondary | ICD-10-CM

## 2013-11-16 DIAGNOSIS — E785 Hyperlipidemia, unspecified: Secondary | ICD-10-CM

## 2013-11-16 DIAGNOSIS — M1990 Unspecified osteoarthritis, unspecified site: Secondary | ICD-10-CM

## 2013-11-16 DIAGNOSIS — I10 Essential (primary) hypertension: Secondary | ICD-10-CM

## 2013-11-16 DIAGNOSIS — I4892 Unspecified atrial flutter: Secondary | ICD-10-CM

## 2013-11-16 MED ORDER — HYDROCHLOROTHIAZIDE 25 MG PO TABS: ORAL_TABLET | ORAL | Status: DC

## 2013-11-16 MED ORDER — AMLODIPINE BESYLATE 10 MG PO TABS: 10.0000 mg | ORAL_TABLET | Freq: Every day | ORAL | Status: DC

## 2013-11-16 MED ORDER — BENAZEPRIL HCL 40 MG PO TABS: 40.0000 mg | ORAL_TABLET | Freq: Every day | ORAL | Status: DC

## 2013-11-16 MED ORDER — ATORVASTATIN CALCIUM 80 MG PO TABS: ORAL_TABLET | ORAL | Status: DC

## 2013-11-16 MED ORDER — METOPROLOL SUCCINATE ER 50 MG PO TB24: 50.0000 mg | ORAL_TABLET | Freq: Every day | ORAL | Status: DC

## 2013-11-16 NOTE — Progress Notes (Signed)
   Subjective:    Patient ID: John [name], male    DOB: 1945-12-25, 68 y.o.   MRN: [id number]  Hypertension This is a chronic problem. The current episode started more than 1 year ago. There are no associated agents to hypertension. Risk factors for coronary artery disease include diabetes mellitus and male gender (sees diabetic specialist). Treatments tried: lotensin, metoprolol, norvasc, HTCZ. There are no compliance problems.    Has seen card for a flutter in the past, tjey maintained the coumadin for awhile now better. They had also taken over lipid management but now specialis t says back to family doc for that  Patient has felt no more palpitations. No excess shortness of breath.  Notes increased urinary frequency. Wonders if he can back off on the HCTZ. Next  Compliant with blood pressure medication. No obvious side effects. Trying to watch salt in his diet.  Compliant with her lipid medications. Does not medicine does. No obvious side effects. Watching fats in his diet.   Review of Systems No chest pain no dyspnea no headache no abdominal pain no change in bowel habits no blood in stool ROS otherwise negative    Objective:   Physical Exam Alert vitals stable. Lungs clear. Heart regular in rhythm. HEENT normal. Ankles without edema.       Assessment & Plan:  Impression 1 hypertension good control discussed #2 hyperlipidemia status uncertain discuss. #3 erectile dysfunction. #4 polyuria. But no nocturia. Patient wishes cyst and medication at same dose after discussion. Plan appropriate blood work. Medications refilled. Diet exercise discussed. Check every 6 months. Of note also has a wellness exam scheduled soon. WSL

## 2013-11-18 LAB — LIPID PANEL
Cholesterol: 158 mg/dL (ref 0–200)
HDL: 50 mg/dL (ref >39)
LDL CALC: 63 mg/dL (ref 0–99)
Total CHOL/HDL Ratio: 3.2 Ratio
Triglycerides: 226 mg/dL — ABNORMAL HIGH (ref <150)
VLDL: 45 mg/dL — ABNORMAL HIGH (ref 0–40)

## 2013-11-18 LAB — BASIC METABOLIC PANEL
BUN: 18 mg/dL (ref 6–23)
CO2: 25 meq/L (ref 19–32)
CREATININE: 0.94 mg/dL (ref 0.50–1.35)
Calcium: 9 mg/dL (ref 8.4–10.5)
Chloride: 105 mEq/L (ref 96–112)
GLUCOSE: 177 mg/dL — AB (ref 70–99)
Potassium: 4.3 mEq/L (ref 3.5–5.3)
Sodium: 140 mEq/L (ref 135–145)

## 2013-11-18 LAB — HEPATIC FUNCTION PANEL
ALBUMIN: 4.1 g/dL (ref 3.5–5.2)
ALK PHOS: 51 U/L (ref 39–117)
ALT: 20 U/L (ref 0–53)
AST: 20 U/L (ref 0–37)
BILIRUBIN DIRECT: 0.2 mg/dL (ref 0.0–0.3)
BILIRUBIN TOTAL: 1.1 mg/dL (ref 0.2–1.2)
Indirect Bilirubin: 0.9 mg/dL (ref 0.2–1.2)
Total Protein: 7.1 g/dL (ref 6.0–8.3)

## 2013-11-19 ENCOUNTER — Ambulatory Visit (INDEPENDENT_AMBULATORY_CARE_PROVIDER_SITE_OTHER): Payer: 59 | Admitting: Family Medicine

## 2013-11-19 ENCOUNTER — Encounter: Payer: Self-pay | Admitting: Family Medicine

## 2013-11-19 VITALS — BP 138/84 | Ht 71.0 in | Wt 250.9 lb

## 2013-11-19 DIAGNOSIS — Z0000 Encounter for general adult medical examination without abnormal findings: Secondary | ICD-10-CM

## 2013-11-19 DIAGNOSIS — Z23 Encounter for immunization: Secondary | ICD-10-CM

## 2013-11-19 LAB — PSA, MEDICARE: PSA: 1.86 ng/mL (ref <=4.00)

## 2013-11-19 MED ORDER — TAMSULOSIN HCL 0.4 MG PO CAPS: 0.4000 mg | ORAL_CAPSULE | Freq: Every day | ORAL | Status: DC

## 2013-11-19 NOTE — Progress Notes (Signed)
Subjective:    Patient ID: John [name], male    DOB: 1945/12/01, 68 y.o.   MRN: [id number]  HPI AWV- Annual Wellness Visit  The patient was seen for their annual wellness visit. The patient's past medical history, surgical history, and family history were reviewed. Pertinent vaccines were reviewed ( tetanus, pneumonia, shingles, flu) The patient's medication list was reviewed and updated.  The height and weight were entered. The patient's current BMI is:35  Cognitive screening was completed. Outcome of Mini - Cog: pass  Falls within the past 6 months:none  Current tobacco usage: not smoking (All patients who use tobacco were given written and verbal information on quitting)  Recent listing of emergency department/hospitalizations over the past year were reviewed.  current specialist the patient sees on a regular basis: Dr Jeanann Lewandowsky   Medicare annual wellness visit patient questionnaire was reviewed.  A written screening schedule for the patient for the next 5-10 years was given. Appropriate discussion of followup regarding next visit was discussed.  Pneum utd on pneumovax  No prev yet  Sees eye doc reg  Heme cult cards today  Last colon 2012 wnl  Results for orders placed in visit on 11/16/13  LIPID PANEL      Result Value Ref Range   Cholesterol 158  0 - 200 mg/dL   Triglycerides 226 (*) <150 mg/dL   HDL 50  >39 mg/dL   Total CHOL/HDL Ratio 3.2     VLDL 45 (*) 0 - 40 mg/dL   LDL Cholesterol 63  0 - 99 mg/dL  HEPATIC FUNCTION PANEL      Result Value Ref Range   Total Bilirubin 1.1  0.2 - 1.2 mg/dL   Bilirubin, Direct 0.2  0.0 - 0.3 mg/dL   Indirect Bilirubin 0.9  0.2 - 1.2 mg/dL   Alkaline Phosphatase 51  39 - 117 U/L   AST 20  0 - 37 U/L   ALT 20  0 - 53 U/L   Total Protein 7.1  6.0 - 8.3 g/dL   Albumin 4.1  3.5 - 5.2 g/dL  BASIC METABOLIC PANEL      Result Value Ref Range   Sodium 140  135 - 145 mEq/L   Potassium 4.3  3.5 - 5.3 mEq/L   Chloride 105  96 - 112 mEq/L   CO2 25  19 - 32 mEq/L   Glucose, Bld 177 (*) 70 - 99 mg/dL   BUN 18  6 - 23 mg/dL   Creat 0.94  0.50 - 1.35 mg/dL   Calcium 9.0  8.4 - 10.5 mg/dL  PSA, MEDICARE      Result Value Ref Range   PSA 1.86  <=4.00 ng/mL    Diet is very good,   See prior notes from last visit.  Review of Systems  Constitutional: Negative for fever, activity change and appetite change.  HENT: Negative for congestion and rhinorrhea.   Eyes: Negative for discharge.  Respiratory: Negative for cough and wheezing.   Cardiovascular: Negative for chest pain.  Gastrointestinal: Negative for vomiting, abdominal pain and blood in stool.  Genitourinary: Negative for frequency and difficulty urinating.  Musculoskeletal: Negative for neck pain.  Skin: Negative for rash.  Allergic/Immunologic: Negative for environmental allergies and food allergies.  Neurological: Negative for weakness and headaches.  Psychiatric/Behavioral: Negative for agitation.  All other systems reviewed and are negative.      Objective:   Physical Exam  Vitals reviewed. Constitutional: He appears well-developed and well-nourished.  HENT:  Head: Normocephalic and atraumatic.  Right Ear: External ear normal.  Left Ear: External ear normal.  Nose: Nose normal.  Mouth/Throat: Oropharynx is clear and moist.  Eyes: EOM are normal. Pupils are equal, round, and reactive to light.  Neck: Normal range of motion. Neck supple. No thyromegaly present.  Cardiovascular: Normal rate, regular rhythm and normal heart sounds.   No murmur heard. Pulmonary/Chest: Effort normal and breath sounds normal. No respiratory distress. He has no wheezes.  Abdominal: Soft. Bowel sounds are normal. He exhibits no distension and no mass. There is no tenderness.  Genitourinary: Penis normal.  Prostate somewhat enlarged on exam diffusely no nodules  Musculoskeletal: Normal range of motion. He exhibits no edema.  Lymphadenopathy:     He has no cervical adenopathy.  Neurological: He is alert. He exhibits normal muscle tone.  Skin: Skin is warm and dry. No erythema.  Psychiatric: He has a normal mood and affect. His behavior is normal. Judgment normal.          Assessment & Plan:  Impression 1 wellness exam #2 up-to-date on colonoscopy #3 prostate hypertrophy discussed plan initiate Flomax rationale discussed. Hemoccult cards. Prevnar today. Diet exercise discussed. WSL

## 2014-01-05 ENCOUNTER — Encounter (HOSPITAL_COMMUNITY): Payer: Self-pay | Admitting: Emergency Medicine

## 2014-01-05 ENCOUNTER — Emergency Department (HOSPITAL_COMMUNITY)
Admission: EM | Admit: 2014-01-05 | Discharge: 2014-01-05 | Disposition: A | Discharge status: Home / Self Care | Payer: Medicare PPO | Attending: Emergency Medicine | Admitting: Emergency Medicine

## 2014-01-05 DIAGNOSIS — Z79899 Other long term (current) drug therapy: Secondary | ICD-10-CM | POA: Diagnosis not present

## 2014-01-05 DIAGNOSIS — E785 Hyperlipidemia, unspecified: Secondary | ICD-10-CM | POA: Insufficient documentation

## 2014-01-05 DIAGNOSIS — Z87891 Personal history of nicotine dependence: Secondary | ICD-10-CM | POA: Insufficient documentation

## 2014-01-05 DIAGNOSIS — I10 Essential (primary) hypertension: Secondary | ICD-10-CM | POA: Diagnosis not present

## 2014-01-05 DIAGNOSIS — N390 Urinary tract infection, site not specified: Secondary | ICD-10-CM | POA: Insufficient documentation

## 2014-01-05 DIAGNOSIS — I5042 Chronic combined systolic (congestive) and diastolic (congestive) heart failure: Secondary | ICD-10-CM | POA: Diagnosis not present

## 2014-01-05 DIAGNOSIS — R110 Nausea: Secondary | ICD-10-CM | POA: Insufficient documentation

## 2014-01-05 DIAGNOSIS — Z8739 Personal history of other diseases of the musculoskeletal system and connective tissue: Secondary | ICD-10-CM | POA: Diagnosis not present

## 2014-01-05 DIAGNOSIS — E119 Type 2 diabetes mellitus without complications: Secondary | ICD-10-CM | POA: Diagnosis not present

## 2014-01-05 DIAGNOSIS — R32 Unspecified urinary incontinence: Secondary | ICD-10-CM | POA: Diagnosis present

## 2014-01-05 LAB — CBC WITH DIFFERENTIAL/PLATELET
Basophils Absolute: 0 10*3/uL (ref 0.0–0.1)
Basophils Relative: 0 % (ref 0–1)
Eosinophils Absolute: 0 10*3/uL (ref 0.0–0.7)
Eosinophils Relative: 0 % (ref 0–5)
HEMATOCRIT: 40.8 % (ref 39.0–52.0)
HEMOGLOBIN: 14.1 g/dL (ref 13.0–17.0)
LYMPHS PCT: 9 % — AB (ref 12–46)
Lymphs Abs: 1.2 10*3/uL (ref 0.7–4.0)
MCH: 29 pg (ref 26.0–34.0)
MCHC: 34.6 g/dL (ref 30.0–36.0)
MCV: 84 fL (ref 78.0–100.0)
MONO ABS: 0.9 10*3/uL (ref 0.1–1.0)
MONOS PCT: 7 % (ref 3–12)
NEUTROS ABS: 10.5 10*3/uL — AB (ref 1.7–7.7)
NEUTROS PCT: 84 % — AB (ref 43–77)
Platelets: 129 10*3/uL — ABNORMAL LOW (ref 150–400)
RBC: 4.86 MIL/uL (ref 4.22–5.81)
RDW: 14.8 % (ref 11.5–15.5)
WBC: 12.6 10*3/uL — AB (ref 4.0–10.5)

## 2014-01-05 LAB — BASIC METABOLIC PANEL
ANION GAP: 15 (ref 5–15)
BUN: 14 mg/dL (ref 6–23)
CO2: 22 meq/L (ref 19–32)
CREATININE: 1.09 mg/dL (ref 0.50–1.35)
Calcium: 8.7 mg/dL (ref 8.4–10.5)
Chloride: 95 mEq/L — ABNORMAL LOW (ref 96–112)
GFR calc Af Amer: 79 mL/min — ABNORMAL LOW (ref >90)
GFR calc non Af Amer: 68 mL/min — ABNORMAL LOW (ref >90)
Glucose, Bld: 240 mg/dL — ABNORMAL HIGH (ref 70–99)
POTASSIUM: 4.3 meq/L (ref 3.7–5.3)
Sodium: 132 mEq/L — ABNORMAL LOW (ref 137–147)

## 2014-01-05 LAB — URINALYSIS, ROUTINE W REFLEX MICROSCOPIC
GLUCOSE, UA: 100 mg/dL — AB
Leukocytes, UA: NEGATIVE
Nitrite: NEGATIVE
Protein, ur: 100 mg/dL — AB
Specific Gravity, Urine: >1.03 — ABNORMAL HIGH (ref 1.005–1.030)
UROBILINOGEN UA: 0.2 mg/dL (ref 0.0–1.0)
pH: 5.5 (ref 5.0–8.0)

## 2014-01-05 LAB — URINE MICROSCOPIC-ADD ON

## 2014-01-05 MED ORDER — CIPROFLOXACIN HCL 250 MG PO TABS
500.0000 mg | ORAL_TABLET | Freq: Once | ORAL | Status: AC
  Administered 2014-01-05: 500 mg via ORAL
  Filled 2014-01-05: qty 2

## 2014-01-05 MED ORDER — ONDANSETRON 4 MG PO TBDP: 4.0000 mg | ORAL_TABLET | Freq: Three times a day (TID) | ORAL | Status: DC | PRN

## 2014-01-05 MED ORDER — ONDANSETRON 4 MG PO TBDP
4.0000 mg | ORAL_TABLET | Freq: Once | ORAL | Status: AC
  Administered 2014-01-05: 4 mg via ORAL
  Filled 2014-01-05: qty 1

## 2014-01-05 MED ORDER — CIPROFLOXACIN HCL 500 MG PO TABS: 500.0000 mg | ORAL_TABLET | Freq: Two times a day (BID) | ORAL | Status: DC

## 2014-01-05 NOTE — Discharge Instructions (Signed)

## 2014-01-05 NOTE — ED Notes (Signed)
12 ml noted in bladder.

## 2014-01-05 NOTE — ED Notes (Signed)
Pt c/o nausea and urinary incontinence since yesterday.  Says urine is steadily dripping.  Denies any pain, vomiting, or diarrhea.  LBM was 2 days ago.

## 2014-01-05 NOTE — ED Provider Notes (Addendum)
This chart was scribed for [place], DO by Edison Simon, ED Scribe. This patient was seen in room APA04/APA04   TIME SEEN: 7:21  CHIEF COMPLAINT: urinary incontinence  HPI:  John [name] is a 68 y.o. male with history of non-insulin-dependent diabetes, hypertension, hyperlipidemia, CHF, atrial flutter who presents to the Emergency Department complaining of urinary incontinence with onset 2 days ago. He states he has not had similar symptoms before. He states that at his last physical with Dr. Wolfgang Phoenix, he was told that his prostate was somewhat enlarged but had normal PSA. He reports urinary incontinence and frequency. He reports associated nausea but no vomiting. He denies recent medication changes. He states his last BM was 2 days ago and normal He denies dysuria, hematuria, penile discharge, fever, chills, vomiting, diarrhea, abdominal pain, back pain, numbness, tingling, weakness in legs, urinary retention or bowel incontinence.  ROS: See HPI Constitutional: no fever, chills Eyes: no drainage  ENT: no runny nose   Cardiovascular:  no chest pain  Resp: no SOB  GI: no vomiting, + nausea GU: no dysuria, + urinary incontinence, + frequency Integumentary: no rash  Allergy: no hives  Musculoskeletal: no leg swelling  Neurological: no slurred speech ROS otherwise negative  PAST MEDICAL HISTORY/PAST SURGICAL HISTORY:  Past Medical History  Diagnosis Date  . Osteoarthritis   . Diabetes mellitus     non insulin dependant  . Hypertension   . Hyperlipidemia   . Chronic combined systolic and diastolic heart failure     echo 01/06/11: mild LVH, EF 65-70%, mod to severe LAE, mild RVE, mild RAE, PASP 32;   TEE 10/12: EF 45-50%   . Atrial flutter 12/2010    Admitted with symptomatic bradycardia (HR 40s), atrial flutter with slow ventricular response 12/2010 + volume overload; AV nodal agents d/c'd and Pradaxa started; RFA in 01/2011  . PSVT (paroxysmal supraventricular tachycardia)      Possibly atrial flutter    MEDICATIONS:  Prior to Admission medications   Medication Sig Start Date End Date Taking? Authorizing Provider  amLODipine (NORVASC) 10 MG tablet Take 1 tablet (10 mg total) by mouth daily. 11/16/13   Mikey Kirschner, MD  atorvastatin (LIPITOR) 80 MG tablet TAKE 1 TABLET EVERY DAY 11/16/13   Mikey Kirschner, MD  benazepril (LOTENSIN) 40 MG tablet Take 1 tablet (40 mg total) by mouth daily. 11/16/13   Mikey Kirschner, MD  glimepiride (AMARYL) 4 MG tablet Take 8 mg by mouth 2 (two) times daily.    Historical Provider, MD  hydrochlorothiazide (HYDRODIURIL) 25 MG tablet TAKE 1 TABLET BY MOUTH EVERY DAY 11/16/13   Mikey Kirschner, MD  metoprolol succinate (TOPROL-XL) 50 MG 24 hr tablet Take 1 tablet (50 mg total) by mouth daily. Take with or immediately following a meal. 11/16/13   Mikey Kirschner, MD  sitaGLIPtan-metformin (JANUMET) 50-1000 MG per tablet Take 1 tablet by mouth 2 (two) times daily with a meal.      Historical Provider, MD  tadalafil (CIALIS) 20 MG tablet Take 1 tablet (20 mg total) by mouth daily as needed. For erectile dysfunction 04/17/12   Yehuda Savannah, MD  tamsulosin (FLOMAX) 0.4 MG CAPS capsule Take 1 capsule (0.4 mg total) by mouth at bedtime. 11/19/13   Mikey Kirschner, MD    ALLERGIES:  No Known Allergies  SOCIAL HISTORY:  History  Substance Use Topics  . Smoking status: Former Smoker -- 1.00 packs/day for 10 years    Types: Cigarettes  Quit date: 03/26/1986  . Smokeless tobacco: Never Used     Comment: "stopped cigarette  smoking 1988"  . Alcohol Use: Yes     Comment: "quit alcohol ~ 2007"    FAMILY HISTORY: Family History  Problem Relation Age of Onset  . Heart attack Mother   . Heart attack Father   . Heart attack Brother     EXAM: BP 128/61  Pulse 62  Temp(Src) 99 F (37.2 C) (Oral)  Resp 20  Ht 6' (1.829 m)  Wt 240 lb (108.863 kg)  BMI 32.54 kg/m2  SpO2 97% CONSTITUTIONAL: Alert and oriented and responds  appropriately to questions. Well-appearing; well-nourished HEAD: Normocephalic EYES: Conjunctivae clear, PERRL ENT: normal nose; no rhinorrhea; moist mucous membranes; pharynx without lesions noted NECK: Supple, no meningismus, no LAD  CARD: RRR; S1 and S2 appreciated; no murmurs, no clicks, no rubs, no gallops RESP: Normal chest excursion without splinting or tachypnea; breath sounds clear and equal bilaterally; no wheezes, no rhonchi, no rales,  ABD/GI: Normal bowel sounds; non-distended; soft, non-tender, no rebound, no guarding; no peritoneal signs BACK:  The back appears normal and is non-tender to palpation, there is no CVA tenderness; no midline spinal tenderness or step-off or deformity EXT: Normal ROM in all joints; non-tender to palpation; no edema; normal capillary refill; no cyanosis    SKIN: Normal color for age and race; warm NEURO: Moves all extremities equally; sensation to light touch intact diffusely, normal gait PSYCH: The patient's mood and manner are appropriate. Grooming and personal hygiene are appropriate. GU: Normal external genitalia, no testicular pain, no masses, no scrotal masses, there is no perineal warmth or erythema or induration or crepitus, no penile discharge, no high riding testicle, 2+ femoral pulses bilaterally RECTAL: normal rectal tone, no gross blood, no melena, he has one small external non-thrombosed no-bleeding hemorrhoid, prostate not enlarged, not tender, soft stool in rectal vault Good sphincter tone of rectum  MEDICAL DECISION MAKING: Patient here with urinary incontinence, frequency and nausea that started 2 days ago. He has no neurologic complaints. No back pain. His prostate is not enlarged or tender. Differential diagnosis includes urinary tract infection, BPH. We'll obtain labs, urine, bladder scan. Doubt cauda equina or spinal stenosis.  ED PROGRESS: Postvoid residual bladder scan shows 12 ML's of urine in the bladder.    Patient appears  to have a urinary tract infection. He has hemoglobin, many bacteria. Culture is pending. Will discharge off Cipro for one week. Have discussed with him at this time I do not feel he needs a Foley catheter placed given he has no urinary retention and that their risk of placing a Foley catheter. He agrees with this plan. Have discussed with him strict return precautions. He has outpatient followup. His labs are otherwise unremarkable except for a mild leukocytosis with left shift likely related to this infection. Creatinine stable and at his baseline. He is otherwise well-appearing, nontoxic. He reports Zofran has significantly helped with his nausea and he has not had any vomiting. Discussed supportive care instructions. Patient verbalizes understanding and is comfortable with plan.     I personally performed the services described in this documentation, which was scribed in my presence. The recorded information has been reviewed and is accurate.    [place], DO 01/05/14 [place], DO 01/05/14 [phone]

## 2014-01-06 LAB — URINE CULTURE: Colony Count: 30000

## 2014-01-28 ENCOUNTER — Other Ambulatory Visit: Payer: Self-pay

## 2014-03-04 ENCOUNTER — Encounter (HOSPITAL_COMMUNITY): Payer: Self-pay | Admitting: Internal Medicine

## 2014-04-02 ENCOUNTER — Other Ambulatory Visit: Payer: Self-pay | Admitting: Family Medicine

## 2014-05-24 ENCOUNTER — Ambulatory Visit (INDEPENDENT_AMBULATORY_CARE_PROVIDER_SITE_OTHER): Payer: Medicare PPO | Admitting: Family Medicine

## 2014-05-24 ENCOUNTER — Encounter: Payer: Self-pay | Admitting: Family Medicine

## 2014-05-24 VITALS — BP 132/80 | Ht 71.0 in | Wt 248.8 lb

## 2014-05-24 DIAGNOSIS — Z79899 Other long term (current) drug therapy: Secondary | ICD-10-CM

## 2014-05-24 DIAGNOSIS — I10 Essential (primary) hypertension: Secondary | ICD-10-CM

## 2014-05-24 DIAGNOSIS — I483 Typical atrial flutter: Secondary | ICD-10-CM

## 2014-05-24 DIAGNOSIS — E785 Hyperlipidemia, unspecified: Secondary | ICD-10-CM

## 2014-05-24 MED ORDER — TAMSULOSIN HCL 0.4 MG PO CAPS: 0.4000 mg | ORAL_CAPSULE | Freq: Every day | ORAL | Status: DC

## 2014-05-24 MED ORDER — AMLODIPINE BESYLATE 10 MG PO TABS: 10.0000 mg | ORAL_TABLET | Freq: Every day | ORAL | Status: DC

## 2014-05-24 MED ORDER — HYDROCHLOROTHIAZIDE 25 MG PO TABS: 25.0000 mg | ORAL_TABLET | Freq: Every day | ORAL | Status: DC

## 2014-05-24 MED ORDER — BENAZEPRIL HCL 40 MG PO TABS: 40.0000 mg | ORAL_TABLET | Freq: Every day | ORAL | Status: DC

## 2014-05-24 MED ORDER — METOPROLOL SUCCINATE ER 50 MG PO TB24: 50.0000 mg | ORAL_TABLET | Freq: Every day | ORAL | Status: DC

## 2014-05-24 MED ORDER — ATORVASTATIN CALCIUM 80 MG PO TABS: ORAL_TABLET | ORAL | Status: DC

## 2014-05-24 NOTE — Progress Notes (Signed)
   Subjective:    Patient ID: John [name], male    DOB: 12-Mar-1946, 69 y.o.   MRN: [id number]  Hypertension This is a chronic problem. The current episode started more than 1 year ago. Risk factors for coronary artery disease include diabetes mellitus, dyslipidemia, male gender and obesity. Treatments tried: norvasc, lotensin, hctz, metoprolol. There are no compliance problems.     Glu generally good . Followed by specialist for his diabetes.  Compliant with blood pressure medicine. No obvious side effects. Has cut down salt intake. Still exercising very regularly.  History of hyperlipidemia area compliant with medication. No obvious side effects. Has cut down fats in his diet. Next  Ongoing challenges with erectile dysfunction. States needs medication for this.    Review of Systems No headache no chest pain no abdominal pain no change in bowel habits no blood in stool    Objective:   Physical Exam Alert no apparent distress. H&T normal. Blood pressure good on repeat. Lungs clear. Heart regular in rhythm. Ankles without edema.       Assessment & Plan:  Impression 1 hypertension good control #2 type 2 diabetes s followed by specialist #3 erectile dysfunction discussed #4 hyperlipidemia status uncertain discuss #5 atrial flutter stable today plan appropriate blood work. Medications refilled. Diet exercise discussed. Recheck in 6 months. For wellness plus checkup. WSL

## 2014-05-31 ENCOUNTER — Other Ambulatory Visit: Payer: Self-pay | Admitting: Family Medicine

## 2014-06-01 LAB — LIPID PANEL
CHOLESTEROL TOTAL: 192 mg/dL (ref 100–199)
Chol/HDL Ratio: 3.1 ratio units (ref 0.0–5.0)
HDL: 62 mg/dL (ref >39)
LDL CALC: 84 mg/dL (ref 0–99)
Triglycerides: 232 mg/dL — ABNORMAL HIGH (ref 0–149)
VLDL CHOLESTEROL CAL: 46 mg/dL — AB (ref 5–40)

## 2014-06-01 LAB — HEPATIC FUNCTION PANEL
ALBUMIN: 4.2 g/dL (ref 3.6–4.8)
ALK PHOS: 60 IU/L (ref 39–117)
ALT: 20 IU/L (ref 0–44)
AST: 17 IU/L (ref 0–40)
BILIRUBIN TOTAL: 0.8 mg/dL (ref 0.0–1.2)
Bilirubin, Direct: 0.19 mg/dL (ref 0.00–0.40)
TOTAL PROTEIN: 6.4 g/dL (ref 6.0–8.5)

## 2014-06-06 ENCOUNTER — Encounter: Payer: Self-pay | Admitting: Family Medicine

## 2014-06-10 ENCOUNTER — Encounter: Payer: Self-pay | Admitting: *Deleted

## 2014-08-12 LAB — HM DIABETES EYE EXAM

## 2014-10-12 ENCOUNTER — Encounter: Payer: Self-pay | Admitting: *Deleted

## 2014-11-23 ENCOUNTER — Encounter: Payer: Medicare PPO | Admitting: Family Medicine

## 2014-11-24 ENCOUNTER — Ambulatory Visit (INDEPENDENT_AMBULATORY_CARE_PROVIDER_SITE_OTHER): Payer: Medicare PPO | Admitting: Family Medicine

## 2014-11-24 ENCOUNTER — Encounter: Payer: Self-pay | Admitting: Family Medicine

## 2014-11-24 VITALS — BP 148/70 | Ht 70.0 in | Wt 244.0 lb

## 2014-11-24 DIAGNOSIS — E785 Hyperlipidemia, unspecified: Secondary | ICD-10-CM

## 2014-11-24 DIAGNOSIS — Z125 Encounter for screening for malignant neoplasm of prostate: Secondary | ICD-10-CM

## 2014-11-24 DIAGNOSIS — I10 Essential (primary) hypertension: Secondary | ICD-10-CM

## 2014-11-24 DIAGNOSIS — Z0000 Encounter for general adult medical examination without abnormal findings: Secondary | ICD-10-CM | POA: Diagnosis not present

## 2014-11-24 DIAGNOSIS — Z23 Encounter for immunization: Secondary | ICD-10-CM

## 2014-11-24 DIAGNOSIS — E119 Type 2 diabetes mellitus without complications: Secondary | ICD-10-CM | POA: Diagnosis not present

## 2014-11-24 MED ORDER — HYDROCHLOROTHIAZIDE 25 MG PO TABS: 25.0000 mg | ORAL_TABLET | Freq: Every day | ORAL | Status: DC

## 2014-11-24 MED ORDER — BENAZEPRIL HCL 40 MG PO TABS: 40.0000 mg | ORAL_TABLET | Freq: Every day | ORAL | Status: DC

## 2014-11-24 MED ORDER — ATORVASTATIN CALCIUM 80 MG PO TABS: ORAL_TABLET | ORAL | Status: DC

## 2014-11-24 MED ORDER — METOPROLOL SUCCINATE ER 50 MG PO TB24: 50.0000 mg | ORAL_TABLET | Freq: Every day | ORAL | Status: DC

## 2014-11-24 MED ORDER — AMLODIPINE BESYLATE 10 MG PO TABS: 10.0000 mg | ORAL_TABLET | Freq: Every day | ORAL | Status: DC

## 2014-11-24 MED ORDER — TAMSULOSIN HCL 0.4 MG PO CAPS: 0.4000 mg | ORAL_CAPSULE | Freq: Every day | ORAL | Status: DC

## 2014-11-24 NOTE — Patient Instructions (Signed)
Thank you for coming for your annual wellness visit.  Please follow through on any advice that was given to you by today's visit. Remember to maintain compliance with your medications as discussed today.  Also remember it is important to eat a healthy diet and to stay physically active on a daily basis.  Please follow through with any testing or recommended followup office visits as was discussed today. You are due the following test coming up:  ALL- Colonoscopy-           Vaccines-   Females- Mammogram-        Breast Exam-        Pelvic Exam-        Bone Density_   Males- Prostate Exam_     Finally remembered that the annual wellness visit does not take the place of regularly scheduled office visits  chronic health problems such as hypertension/diabetes/cholesterol visits.

## 2014-11-24 NOTE — Progress Notes (Signed)
Subjective:    Patient ID: John [name], male    DOB: Jun 23, 1945, 69 y.o.   MRN: [id number]  HPI AWV- Annual Wellness Visit 2012 neg colonoscopy and rep i ten yrs   The patient was seen for their annual wellness visit. The patient's past medical history, surgical history, and family history were reviewed. Pertinent vaccines were reviewed ( tetanus, pneumonia, shingles, flu) The patient's medication list was reviewed and updated.  The height and weight were entered. The patient's current BMI is: 35.01  Cognitive screening was completed. Outcome of Mini - Cog: pass  Falls within the past 6 months:none  Current tobacco usage: none (All patients who use tobacco were given written and verbal information on quitting)  Recent listing of emergency department/hospitalizations over the past year were reviewed.  current specialist the patient sees on a regular basis: [facility]   Medicare annual wellness visit patient questionnaire was reviewed.  A written screening schedule for the patient for the next 5-10 years was given. Appropriate discussion of followup regarding next visit was discussed.  Pt states no concerns or problems today.   Exercising four d per wk  A1c down to 8 or so  BP generally running good, 130 is on the top   Due for rep b work  Neg depr neg falls   Patient states compliant with cholesterol medication. No obvious side effects prescribed on fat intake. Next  Compliant with blood pressure medicine. Watch his salt intake. No obvious side effects.  Compliant with Flomax. States it has helped a lot.       Review of Systems  Constitutional: Negative for fever, activity change and appetite change.  HENT: Negative for congestion and rhinorrhea.   Eyes: Negative for discharge.  Respiratory: Negative for cough and wheezing.   Cardiovascular: Negative for chest pain.  Gastrointestinal: Negative for vomiting, abdominal pain and blood  in stool.  Genitourinary: Negative for frequency and difficulty urinating.  Musculoskeletal: Negative for neck pain.  Skin: Negative for rash.  Allergic/Immunologic: Negative for environmental allergies and food allergies.  Neurological: Negative for weakness and headaches.  Psychiatric/Behavioral: Negative for agitation.  All other systems reviewed and are negative.      Objective:   Physical Exam  Constitutional: He appears well-developed and well-nourished.  Obesity present  HENT:  Head: Normocephalic and atraumatic.  Right Ear: External ear normal.  Left Ear: External ear normal.  Nose: Nose normal.  Mouth/Throat: Oropharynx is clear and moist.  Eyes: EOM are normal. Pupils are equal, round, and reactive to light.  Neck: Normal range of motion. Neck supple. No thyromegaly present.  Cardiovascular: Normal rate, regular rhythm and normal heart sounds.   No murmur heard. Pulmonary/Chest: Effort normal and breath sounds normal. No respiratory distress. He has no wheezes.  Abdominal: Soft. Bowel sounds are normal. He exhibits no distension and no mass. There is no tenderness.  Genitourinary: Penis normal.  Musculoskeletal: Normal range of motion. He exhibits no edema.  Lymphadenopathy:    He has no cervical adenopathy.  Neurological: He is alert. He exhibits normal muscle tone.  Skin: Skin is warm and dry. No erythema.  Psychiatric: He has a normal mood and affect. His behavior is normal. Judgment normal.  Vitals reviewed.         Assessment & Plan:  Impression 1 wellness exam. Up-to-date on colonoscopy #2 hypertension good control #3 hyperlipidemia status uncertain #4 prostate hypertrophy good control with medications plan appropriate blood work. Diet exercise discussed. Heme  occult cards. Maintain same medications. Recheck in 6 months. WSL

## 2014-11-27 LAB — BASIC METABOLIC PANEL
BUN/Creatinine Ratio: 15 (ref 10–22)
BUN: 14 mg/dL (ref 8–27)
CALCIUM: 9.7 mg/dL (ref 8.6–10.2)
CO2: 23 mmol/L (ref 18–29)
CREATININE: 0.94 mg/dL (ref 0.76–1.27)
Chloride: 101 mmol/L (ref 97–108)
GFR calc Af Amer: 96 mL/min/{1.73_m2} (ref >59)
GFR, EST NON AFRICAN AMERICAN: 83 mL/min/{1.73_m2} (ref >59)
Glucose: 241 mg/dL — ABNORMAL HIGH (ref 65–99)
POTASSIUM: 4.9 mmol/L (ref 3.5–5.2)
Sodium: 140 mmol/L (ref 134–144)

## 2014-11-27 LAB — HEPATIC FUNCTION PANEL
ALT: 21 IU/L (ref 0–44)
AST: 30 IU/L (ref 0–40)
Albumin: 4.3 g/dL (ref 3.6–4.8)
Alkaline Phosphatase: 61 IU/L (ref 39–117)
Bilirubin Total: 0.9 mg/dL (ref 0.0–1.2)
Bilirubin, Direct: 0.14 mg/dL (ref 0.00–0.40)
TOTAL PROTEIN: 7.1 g/dL (ref 6.0–8.5)

## 2014-11-27 LAB — LIPID PANEL
CHOL/HDL RATIO: 5.2 ratio — AB (ref 0.0–5.0)
Cholesterol, Total: 295 mg/dL — ABNORMAL HIGH (ref 100–199)
HDL: 57 mg/dL (ref >39)
Triglycerides: 522 mg/dL — ABNORMAL HIGH (ref 0–149)

## 2014-11-27 LAB — PSA: Prostate Specific Ag, Serum: 1.2 ng/mL (ref 0.0–4.0)

## 2014-11-29 ENCOUNTER — Encounter: Payer: Self-pay | Admitting: Family Medicine

## 2015-05-18 DIAGNOSIS — M10029 Idiopathic gout, unspecified elbow: Secondary | ICD-10-CM | POA: Diagnosis not present

## 2015-05-18 DIAGNOSIS — I509 Heart failure, unspecified: Secondary | ICD-10-CM | POA: Diagnosis not present

## 2015-05-18 DIAGNOSIS — I10 Essential (primary) hypertension: Secondary | ICD-10-CM | POA: Diagnosis not present

## 2015-05-18 DIAGNOSIS — E119 Type 2 diabetes mellitus without complications: Secondary | ICD-10-CM | POA: Diagnosis not present

## 2015-05-18 DIAGNOSIS — E7800 Pure hypercholesterolemia, unspecified: Secondary | ICD-10-CM | POA: Diagnosis not present

## 2015-05-25 ENCOUNTER — Ambulatory Visit: Payer: Medicare PPO | Admitting: Family Medicine

## 2015-05-26 ENCOUNTER — Encounter: Payer: Self-pay | Admitting: Family Medicine

## 2015-05-26 ENCOUNTER — Ambulatory Visit (INDEPENDENT_AMBULATORY_CARE_PROVIDER_SITE_OTHER): Payer: Commercial Managed Care - HMO | Admitting: Family Medicine

## 2015-05-26 VITALS — BP 136/70 | Ht 70.0 in | Wt 250.0 lb

## 2015-05-26 DIAGNOSIS — I10 Essential (primary) hypertension: Secondary | ICD-10-CM

## 2015-05-26 DIAGNOSIS — I4892 Unspecified atrial flutter: Secondary | ICD-10-CM | POA: Diagnosis not present

## 2015-05-26 DIAGNOSIS — E785 Hyperlipidemia, unspecified: Secondary | ICD-10-CM

## 2015-05-26 MED ORDER — ROSUVASTATIN CALCIUM 20 MG PO TABS: 20.0000 mg | ORAL_TABLET | Freq: Every day | ORAL | Status: DC

## 2015-05-26 MED ORDER — BENAZEPRIL HCL 40 MG PO TABS: 40.0000 mg | ORAL_TABLET | Freq: Every day | ORAL | Status: DC

## 2015-05-26 MED ORDER — METOPROLOL SUCCINATE ER 50 MG PO TB24: 50.0000 mg | ORAL_TABLET | Freq: Every day | ORAL | Status: DC

## 2015-05-26 MED ORDER — AMLODIPINE BESYLATE 10 MG PO TABS: 10.0000 mg | ORAL_TABLET | Freq: Every day | ORAL | Status: DC

## 2015-05-26 MED ORDER — HYDROCHLOROTHIAZIDE 25 MG PO TABS: 25.0000 mg | ORAL_TABLET | Freq: Every day | ORAL | Status: DC

## 2015-05-26 MED ORDER — TAMSULOSIN HCL 0.4 MG PO CAPS: 0.4000 mg | ORAL_CAPSULE | Freq: Every day | ORAL | Status: DC

## 2015-05-26 NOTE — Progress Notes (Signed)
   Subjective:    Patient ID: John [name], male    DOB: 10-24-1945, 70 y.o.   MRN: [id number]  patient arrives office with numerous concerns   Diabetes He presents for his follow-up diabetic visit. He has type 2 diabetes mellitus. No MedicAlert identification noted. He has not had a previous visit with a dietitian. He sees a podiatrist.Eye exam is current (May 2016).   Results for orders placed or performed in visit on 11/24/14  Lipid panel  Result Value Ref Range   Cholesterol, Total 295 (H) 100 - 199 mg/dL   Triglycerides 522 (H) 0 - 149 mg/dL   HDL 57 >39 mg/dL   VLDL Cholesterol Cal Comment 5 - 40 mg/dL   LDL Calculated Comment 0 - 99 mg/dL   Chol/HDL Ratio 5.2 (H) 0.0 - 5.0 ratio units  Hepatic function panel  Result Value Ref Range   Total Protein 7.1 6.0 - 8.5 g/dL   Albumin 4.3 3.6 - 4.8 g/dL   Bilirubin Total 0.9 0.0 - 1.2 mg/dL   Bilirubin, Direct 0.14 0.00 - 0.40 mg/dL   Alkaline Phosphatase 61 39 - 117 IU/L   AST 30 0 - 40 IU/L   ALT 21 0 - 44 IU/L  Basic metabolic panel  Result Value Ref Range   Glucose 241 (H) 65 - 99 mg/dL   BUN 14 8 - 27 mg/dL   Creatinine, Ser 0.94 0.76 - 1.27 mg/dL   GFR calc non Af Amer 83 >59 mL/min/1.73   GFR calc Af Amer 96 >59 mL/min/1.73   BUN/Creatinine Ratio 15 10 - 22   Sodium 140 134 - 144 mmol/L   Potassium 4.9 3.5 - 5.2 mmol/L   Chloride 101 97 - 108 mmol/L   CO2 23 18 - 29 mmol/L   Calcium 9.7 8.6 - 10.2 mg/dL  PSA  Result Value Ref Range   Prostate Specific Ag, Serum 1.2 0.0 - 4.0 ng/mL   Took lipitor  For years, recently stopped due to progressive achines in joints and muscles stopped the Lipitor. Felt deftly caused him side effects.  Just added lantus, on for about a week, along with oral meds  flomax generic working freat, does not miss a dose. Medication deftly helps a lot would like to maintain with   history of atrial fibrillation which required radiofrequency ablation. Patient has noted no chest pain or  skipped heartbeats. Some exertional dyspnea  Home num Patient would like to discuss cholesterol.   Review of Systems  no headache no chest pain and back pain abdominal pain no change in bowel habits     Objective:   Physical Exam   alert no acute distress. HEENT normal. Lungs clear. Heart  Controlled rate but irregular rhythm. Abdomen soft. Ankles without edema      Assessment & Plan:  Card ref impression 1 hypertension good control discussed #2 hyperlipidemia with unfortunately side effects from Lipitor need to try a different medication discussed length we'll try Crestor 20 #3 atrial flutter. EKG reveals this. Plan cardiology referral. Need to assess for whether further intervention needs to be done and also questions regarding potential anticoagulation with other risk factors. Initiate Crestor 20 mg daily at bedtime. Appropriate blood work in 3 months WSL

## 2015-06-01 ENCOUNTER — Encounter: Payer: Self-pay | Admitting: Cardiovascular Disease

## 2015-06-01 ENCOUNTER — Ambulatory Visit (INDEPENDENT_AMBULATORY_CARE_PROVIDER_SITE_OTHER): Payer: Commercial Managed Care - HMO | Admitting: Cardiovascular Disease

## 2015-06-01 VITALS — BP 150/88 | HR 63 | Ht 70.0 in | Wt 245.0 lb

## 2015-06-01 DIAGNOSIS — I10 Essential (primary) hypertension: Secondary | ICD-10-CM | POA: Diagnosis not present

## 2015-06-01 DIAGNOSIS — I4892 Unspecified atrial flutter: Secondary | ICD-10-CM | POA: Diagnosis not present

## 2015-06-01 DIAGNOSIS — E785 Hyperlipidemia, unspecified: Secondary | ICD-10-CM | POA: Diagnosis not present

## 2015-06-01 NOTE — Progress Notes (Signed)
Patient ID: John [name], male   DOB: [birth year], 70 y.o.   MRN: [id number]      SUBJECTIVE: The patient is a 70 year old male with a reported history of atrial flutter status post radiofrequency ablation in November 2012. He also is a history of diabetes, hypertension, and hyperlipidemia. He had been on anticoagulation with Pradaxa but this was discontinued after ablation.  He had an office visit with his PCP on 05/26/15 which mentions that he is back in atrial flutter, but I do not have a copy of this ECG.  The patient denies any symptoms of chest pain, palpitations, shortness of breath, lightheadedness, dizziness, leg swelling, orthopnea, PND, and syncope.    ECG performed in our office today demonstrates sinus bradycardia, heart rate 56 bpm, first-degree AV block, PR 288 ms.   Lipids on 05/18/15 showed total cholesterol 252, triglycerides 467, HDL 59. LDL could not be calculated. He is yet to begin Crestor.   Review of Systems: As per "subjective", otherwise negative.  No Known Allergies  Current Outpatient Prescriptions  Medication Sig Dispense Refill  . amLODipine (NORVASC) 10 MG tablet Take 1 tablet (10 mg total) by mouth daily. 90 tablet 1  . benazepril (LOTENSIN) 40 MG tablet Take 1 tablet (40 mg total) by mouth daily. 90 tablet 1  . hydrochlorothiazide (HYDRODIURIL) 25 MG tablet Take 1 tablet (25 mg total) by mouth daily. 90 tablet 1  . LANTUS SOLOSTAR 100 UNIT/ML Solostar Pen INJECT 15 UNITS EVERY DAY  3  . Magnesium 400 MG CAPS Take by mouth.    . metoprolol succinate (TOPROL-XL) 50 MG 24 hr tablet Take 1 tablet (50 mg total) by mouth daily. Take with or immediately following a meal. 90 tablet 1  . Multiple Vitamins-Minerals (MULTIVITAMIN WITH MINERALS) tablet Take 1 tablet by mouth daily.    . Omega-3 Fatty Acids (FISH OIL) 1000 MG CAPS Take 1 capsule by mouth daily.    . rosuvastatin (CRESTOR) 20 MG tablet Take 1 tablet (20 mg total) by mouth daily. 90 tablet 1  .  sitaGLIPtan-metformin (JANUMET) 50-1000 MG per tablet Take 1 tablet by mouth 2 (two) times daily with a meal.      . tadalafil (CIALIS) 20 MG tablet Take 1 tablet (20 mg total) by mouth daily as needed. For erectile dysfunction 10 tablet 6  . tamsulosin (FLOMAX) 0.4 MG CAPS capsule Take 1 capsule (0.4 mg total) by mouth at bedtime. 90 capsule 1  . [DISCONTINUED] hydrALAZINE (APRESOLINE) 25 MG tablet Take 1 tablet (25 mg total) by mouth 3 (three) times daily. 270 tablet 3   No current facility-administered medications for this visit.    Past Medical History  Diagnosis Date  . Osteoarthritis   . Diabetes mellitus     non insulin dependant  . Hypertension   . Hyperlipidemia   . Chronic combined systolic and diastolic heart failure (HCC)     echo 01/06/11: mild LVH, EF 65-70%, mod to severe LAE, mild RVE, mild RAE, PASP 32;   TEE 10/12: EF 45-50%   . Atrial flutter ([place]) 12/2010    Admitted with symptomatic bradycardia (HR 40s), atrial flutter with slow ventricular response 12/2010 + volume overload; AV nodal agents d/c'd and Pradaxa started; RFA in 01/2011  . PSVT (paroxysmal supraventricular tachycardia) (HCC)     Possibly atrial flutter    Past Surgical History  Procedure Laterality Date  . Knee arthroscopy  2005    left  . Lumbar spine surgery      "  I've had 6 ORs 1972 thru 2004"  . Cardiac electrophysiology study and ablation  02/07/11  . Atrial flutter ablation N/A 02/07/2011    Procedure: ATRIAL FLUTTER ABLATION;  Surgeon: Evans Lance, MD;  Location: [facility] CATH LAB;  Service: Cardiovascular;  Laterality: N/A;    Social History   Social History  . Marital Status: Married    Spouse Name: N/A  . Number of Children: N/A  . Years of Education: N/A   Occupational History  . Designer, industrial/product    Social History Main Topics  . Smoking status: Former Smoker -- 1.00 packs/day for 10 years    Types: Cigarettes    Quit date: 03/26/1986  . Smokeless tobacco: Never Used      Comment: "stopped cigarette  smoking 1988"  . Alcohol Use: 0.0 oz/week    0 Standard drinks or equivalent per week     Comment: "quit alcohol ~ 2007"  . Drug Use: No  . Sexual Activity: Not on file   Other Topics Concern  . Not on file   Social History Narrative     Filed Vitals:   06/01/15 1621  BP: 150/88  Pulse: 63  Height: 5' 10"  (1.778 m)  Weight: 245 lb (111.131 kg)  SpO2: 96%    PHYSICAL EXAM General: NAD HEENT: Normal. Neck: No JVD, no thyromegaly. Lungs: Clear to auscultation bilaterally with normal respiratory effort. CV: Nondisplaced PMI.  Regular rate and rhythm, normal S1/S2, no S3/S4, no murmur. No pretibial or periankle edema.     Abdomen: Soft, nontender, no distention.  Neurologic: Alert and oriented.  Psych: Normal affect. Skin: Normal. Musculoskeletal: No gross deformities.  ECG: Most recent ECG reviewed.      ASSESSMENT AND PLAN: 1. Atrial flutter: CHA2DS2Vasc score is 3 thus anticoagulation is indicated. However, I will need to review ECG from PCP's office. If he indeed has atrial flutter, I will stop ASA and start Xarelto 20 mg daily. I would d/c ASA if doing so. I would then make an EP referral for ablation consideration.  2. Essential HTN: Elevated today. May need further antihypertensive titration.  3. Hyperlipidemia: Lipids reviewed above. Awaiting Crestor.  Dispo: f/u to be determined.   [name], M.D., F.A.C.C.

## 2015-06-01 NOTE — Patient Instructions (Signed)
Medication Instructions:  Your physician recommends that you continue on your current medications as directed. Please refer to the Current Medication list given to you today.  Labwork: NONE  Testing/Procedures: NONE  Follow-Up: TBT-   Any Other Special Instructions Will Be Listed Below (If Applicable).   WE WILL CALL YOU ONCE WE GET THE EKG FROM DR. Lance Sell OFFICE  If you need a refill on your cardiac medications before your next appointment, please call your pharmacy.

## 2015-06-14 ENCOUNTER — Ambulatory Visit: Payer: Medicare PPO | Admitting: Cardiology

## 2015-06-14 DIAGNOSIS — I10 Essential (primary) hypertension: Secondary | ICD-10-CM | POA: Diagnosis not present

## 2015-06-14 DIAGNOSIS — E7800 Pure hypercholesterolemia, unspecified: Secondary | ICD-10-CM | POA: Diagnosis not present

## 2015-06-14 DIAGNOSIS — E119 Type 2 diabetes mellitus without complications: Secondary | ICD-10-CM | POA: Diagnosis not present

## 2015-07-14 DIAGNOSIS — E119 Type 2 diabetes mellitus without complications: Secondary | ICD-10-CM | POA: Diagnosis not present

## 2015-08-11 DIAGNOSIS — E119 Type 2 diabetes mellitus without complications: Secondary | ICD-10-CM | POA: Diagnosis not present

## 2015-08-25 ENCOUNTER — Encounter (HOSPITAL_COMMUNITY): Payer: Self-pay | Admitting: Cardiology

## 2015-08-25 ENCOUNTER — Telehealth: Payer: Self-pay | Admitting: Family Medicine

## 2015-08-25 ENCOUNTER — Inpatient Hospital Stay (HOSPITAL_COMMUNITY)
Admission: EM | Admit: 2015-08-25 | Discharge: 2015-08-30 | DRG: 242 | Disposition: A | Discharge status: Home / Self Care | Payer: Commercial Managed Care - HMO | Attending: Cardiology | Admitting: Cardiology

## 2015-08-25 ENCOUNTER — Inpatient Hospital Stay (HOSPITAL_BASED_OUTPATIENT_CLINIC_OR_DEPARTMENT_OTHER): Payer: Commercial Managed Care - HMO

## 2015-08-25 ENCOUNTER — Emergency Department (HOSPITAL_COMMUNITY): Payer: Commercial Managed Care - HMO

## 2015-08-25 DIAGNOSIS — R7989 Other specified abnormal findings of blood chemistry: Secondary | ICD-10-CM

## 2015-08-25 DIAGNOSIS — I441 Atrioventricular block, second degree: Secondary | ICD-10-CM | POA: Diagnosis present

## 2015-08-25 DIAGNOSIS — I4892 Unspecified atrial flutter: Secondary | ICD-10-CM | POA: Diagnosis present

## 2015-08-25 DIAGNOSIS — E785 Hyperlipidemia, unspecified: Secondary | ICD-10-CM | POA: Diagnosis present

## 2015-08-25 DIAGNOSIS — M1990 Unspecified osteoarthritis, unspecified site: Secondary | ICD-10-CM | POA: Diagnosis present

## 2015-08-25 DIAGNOSIS — I5021 Acute systolic (congestive) heart failure: Secondary | ICD-10-CM

## 2015-08-25 DIAGNOSIS — R001 Bradycardia, unspecified: Secondary | ICD-10-CM

## 2015-08-25 DIAGNOSIS — I442 Atrioventricular block, complete: Secondary | ICD-10-CM | POA: Diagnosis present

## 2015-08-25 DIAGNOSIS — Z6834 Body mass index (BMI) 34.0-34.9, adult: Secondary | ICD-10-CM

## 2015-08-25 DIAGNOSIS — I472 Ventricular tachycardia: Secondary | ICD-10-CM | POA: Diagnosis present

## 2015-08-25 DIAGNOSIS — I119 Hypertensive heart disease without heart failure: Secondary | ICD-10-CM

## 2015-08-25 DIAGNOSIS — Z794 Long term (current) use of insulin: Secondary | ICD-10-CM | POA: Diagnosis not present

## 2015-08-25 DIAGNOSIS — I5032 Chronic diastolic (congestive) heart failure: Secondary | ICD-10-CM | POA: Diagnosis not present

## 2015-08-25 DIAGNOSIS — I5043 Acute on chronic combined systolic (congestive) and diastolic (congestive) heart failure: Secondary | ICD-10-CM | POA: Diagnosis present

## 2015-08-25 DIAGNOSIS — I509 Heart failure, unspecified: Secondary | ICD-10-CM | POA: Diagnosis not present

## 2015-08-25 DIAGNOSIS — I110 Hypertensive heart disease with heart failure: Secondary | ICD-10-CM | POA: Diagnosis present

## 2015-08-25 DIAGNOSIS — R0602 Shortness of breath: Secondary | ICD-10-CM

## 2015-08-25 DIAGNOSIS — R778 Other specified abnormalities of plasma proteins: Secondary | ICD-10-CM

## 2015-08-25 DIAGNOSIS — Z87891 Personal history of nicotine dependence: Secondary | ICD-10-CM

## 2015-08-25 DIAGNOSIS — J939 Pneumothorax, unspecified: Secondary | ICD-10-CM | POA: Diagnosis not present

## 2015-08-25 DIAGNOSIS — E876 Hypokalemia: Secondary | ICD-10-CM | POA: Diagnosis present

## 2015-08-25 DIAGNOSIS — E669 Obesity, unspecified: Secondary | ICD-10-CM | POA: Diagnosis present

## 2015-08-25 DIAGNOSIS — E782 Mixed hyperlipidemia: Secondary | ICD-10-CM | POA: Diagnosis present

## 2015-08-25 DIAGNOSIS — Z950 Presence of cardiac pacemaker: Secondary | ICD-10-CM

## 2015-08-25 DIAGNOSIS — I4729 Other ventricular tachycardia: Secondary | ICD-10-CM

## 2015-08-25 DIAGNOSIS — E1159 Type 2 diabetes mellitus with other circulatory complications: Secondary | ICD-10-CM | POA: Diagnosis present

## 2015-08-25 DIAGNOSIS — R0600 Dyspnea, unspecified: Secondary | ICD-10-CM | POA: Diagnosis not present

## 2015-08-25 DIAGNOSIS — I5031 Acute diastolic (congestive) heart failure: Secondary | ICD-10-CM | POA: Insufficient documentation

## 2015-08-25 LAB — CBC WITH DIFFERENTIAL/PLATELET
BASOS ABS: 0 10*3/uL (ref 0.0–0.1)
Basophils Relative: 0 %
Eosinophils Absolute: 0.1 10*3/uL (ref 0.0–0.7)
Eosinophils Relative: 1 %
HEMATOCRIT: 41.1 % (ref 39.0–52.0)
HEMOGLOBIN: 13.6 g/dL (ref 13.0–17.0)
LYMPHS PCT: 27 %
Lymphs Abs: 2.8 10*3/uL (ref 0.7–4.0)
MCH: 28.3 pg (ref 26.0–34.0)
MCHC: 33.1 g/dL (ref 30.0–36.0)
MCV: 85.6 fL (ref 78.0–100.0)
Monocytes Absolute: 0.8 10*3/uL (ref 0.1–1.0)
Monocytes Relative: 8 %
NEUTROS ABS: 6.7 10*3/uL (ref 1.7–7.7)
Neutrophils Relative %: 64 %
Platelets: 212 10*3/uL (ref 150–400)
RBC: 4.8 MIL/uL (ref 4.22–5.81)
RDW: 14.6 % (ref 11.5–15.5)
WBC: 10.4 10*3/uL (ref 4.0–10.5)

## 2015-08-25 LAB — COMPREHENSIVE METABOLIC PANEL
ALK PHOS: 58 U/L (ref 38–126)
ALT: 33 U/L (ref 17–63)
AST: 24 U/L (ref 15–41)
Albumin: 3.5 g/dL (ref 3.5–5.0)
Anion gap: 6 (ref 5–15)
BILIRUBIN TOTAL: 1.1 mg/dL (ref 0.3–1.2)
BUN: 15 mg/dL (ref 6–20)
CALCIUM: 8.7 mg/dL — AB (ref 8.9–10.3)
CHLORIDE: 110 mmol/L (ref 101–111)
CO2: 23 mmol/L (ref 22–32)
CREATININE: 0.84 mg/dL (ref 0.61–1.24)
GFR calc Af Amer: >60 mL/min (ref >60)
Glucose, Bld: 209 mg/dL — ABNORMAL HIGH (ref 65–99)
Potassium: 3.6 mmol/L (ref 3.5–5.1)
Sodium: 139 mmol/L (ref 135–145)
TOTAL PROTEIN: 6.9 g/dL (ref 6.5–8.1)

## 2015-08-25 LAB — GLUCOSE, CAPILLARY
Glucose-Capillary: 194 mg/dL — ABNORMAL HIGH (ref 65–99)
Glucose-Capillary: 116 mg/dL — ABNORMAL HIGH (ref 65–99)

## 2015-08-25 LAB — BRAIN NATRIURETIC PEPTIDE: B NATRIURETIC PEPTIDE 5: 717 pg/mL — AB (ref 0.0–100.0)

## 2015-08-25 LAB — ECHOCARDIOGRAM COMPLETE
HEIGHTINCHES: 71 in
Weight: 3920 oz

## 2015-08-25 LAB — TSH: TSH: 1.563 u[IU]/mL (ref 0.350–4.500)

## 2015-08-25 LAB — TROPONIN I: Troponin I: 0.05 ng/mL — ABNORMAL HIGH (ref <0.031)

## 2015-08-25 MED ORDER — TAMSULOSIN HCL 0.4 MG PO CAPS
0.4000 mg | ORAL_CAPSULE | Freq: Every day | ORAL | Status: DC
  Administered 2015-08-25 – 2015-08-29 (×5): 0.4 mg via ORAL
  Filled 2015-08-25 (×5): qty 1

## 2015-08-25 MED ORDER — SODIUM CHLORIDE 0.9% FLUSH
3.0000 mL | Freq: Two times a day (BID) | INTRAVENOUS | Status: DC
  Administered 2015-08-25 – 2015-08-30 (×9): 3 mL via INTRAVENOUS

## 2015-08-25 MED ORDER — INSULIN GLARGINE 100 UNIT/ML ~~LOC~~ SOLN
45.0000 [IU] | Freq: Every day | SUBCUTANEOUS | Status: DC
  Administered 2015-08-26 – 2015-08-29 (×4): 45 [IU] via SUBCUTANEOUS
  Filled 2015-08-25 (×10): qty 0.45

## 2015-08-25 MED ORDER — BENAZEPRIL HCL 20 MG PO TABS
40.0000 mg | ORAL_TABLET | Freq: Every day | ORAL | Status: DC
  Administered 2015-08-26 – 2015-08-30 (×5): 40 mg via ORAL
  Filled 2015-08-25 (×4): qty 2
  Filled 2015-08-25: qty 4

## 2015-08-25 MED ORDER — ROSUVASTATIN CALCIUM 20 MG PO TABS
20.0000 mg | ORAL_TABLET | Freq: Every day | ORAL | Status: DC
  Administered 2015-08-26 – 2015-08-29 (×4): 20 mg via ORAL
  Filled 2015-08-25 (×4): qty 1

## 2015-08-25 MED ORDER — ADULT MULTIVITAMIN W/MINERALS CH
1.0000 | ORAL_TABLET | Freq: Every day | ORAL | Status: DC
  Administered 2015-08-25 – 2015-08-30 (×6): 1 via ORAL
  Filled 2015-08-25 (×6): qty 1

## 2015-08-25 MED ORDER — INSULIN ASPART 100 UNIT/ML ~~LOC~~ SOLN
4.0000 [IU] | Freq: Three times a day (TID) | SUBCUTANEOUS | Status: DC
  Administered 2015-08-25 – 2015-08-30 (×11): 4 [IU] via SUBCUTANEOUS

## 2015-08-25 MED ORDER — ACETAMINOPHEN 325 MG PO TABS: 650.0000 mg | ORAL_TABLET | Freq: Four times a day (QID) | ORAL | Status: DC | PRN

## 2015-08-25 MED ORDER — SENNOSIDES-DOCUSATE SODIUM 8.6-50 MG PO TABS: 1.0000 | ORAL_TABLET | Freq: Every evening | ORAL | Status: DC | PRN

## 2015-08-25 MED ORDER — HYDROCHLOROTHIAZIDE 25 MG PO TABS
25.0000 mg | ORAL_TABLET | Freq: Every day | ORAL | Status: DC
  Administered 2015-08-26 – 2015-08-30 (×5): 25 mg via ORAL
  Filled 2015-08-25 (×5): qty 1

## 2015-08-25 MED ORDER — SODIUM CHLORIDE 0.9% FLUSH: 3.0000 mL | INTRAVENOUS | Status: DC | PRN

## 2015-08-25 MED ORDER — ACETAMINOPHEN 650 MG RE SUPP: 650.0000 mg | Freq: Four times a day (QID) | RECTAL | Status: DC | PRN

## 2015-08-25 MED ORDER — ONDANSETRON HCL 4 MG/2ML IJ SOLN: 4.0000 mg | Freq: Three times a day (TID) | INTRAMUSCULAR | Status: DC | PRN

## 2015-08-25 MED ORDER — SODIUM CHLORIDE 0.9 % IV SOLN: 250.0000 mL | INTRAVENOUS | Status: DC | PRN

## 2015-08-25 MED ORDER — ONDANSETRON HCL 4 MG PO TABS: 4.0000 mg | ORAL_TABLET | Freq: Four times a day (QID) | ORAL | Status: DC | PRN

## 2015-08-25 MED ORDER — ONDANSETRON HCL 4 MG/2ML IJ SOLN: 4.0000 mg | Freq: Four times a day (QID) | INTRAMUSCULAR | Status: DC | PRN

## 2015-08-25 MED ORDER — INSULIN ASPART 100 UNIT/ML ~~LOC~~ SOLN
0.0000 [IU] | Freq: Three times a day (TID) | SUBCUTANEOUS | Status: DC
  Administered 2015-08-25: 3 [IU] via SUBCUTANEOUS
  Administered 2015-08-27: 2 [IU] via SUBCUTANEOUS
  Administered 2015-08-28: 3 [IU] via SUBCUTANEOUS
  Administered 2015-08-28: 2 [IU] via SUBCUTANEOUS
  Administered 2015-08-29 (×2): 3 [IU] via SUBCUTANEOUS
  Administered 2015-08-30: 2 [IU] via SUBCUTANEOUS

## 2015-08-25 MED ORDER — OMEGA-3-ACID ETHYL ESTERS 1 G PO CAPS
1.0000 g | ORAL_CAPSULE | Freq: Every day | ORAL | Status: DC
  Administered 2015-08-26 – 2015-08-30 (×5): 1 g via ORAL
  Filled 2015-08-25 (×6): qty 1

## 2015-08-25 MED ORDER — MAGNESIUM OXIDE 400 (241.3 MG) MG PO TABS
400.0000 mg | ORAL_TABLET | Freq: Once | ORAL | Status: AC
Start: 2015-08-25 — End: 2015-08-25
  Administered 2015-08-25: 400 mg via ORAL
  Filled 2015-08-25: qty 1

## 2015-08-25 MED ORDER — ATROPINE SULFATE 1 MG/ML IJ SOLN
INTRAMUSCULAR | Status: AC
  Filled 2015-08-25: qty 1

## 2015-08-25 MED ORDER — INSULIN ASPART 100 UNIT/ML ~~LOC~~ SOLN: 0.0000 [IU] | Freq: Every day | SUBCUTANEOUS | Status: DC

## 2015-08-25 MED ORDER — ENOXAPARIN SODIUM 60 MG/0.6ML ~~LOC~~ SOLN
50.0000 mg | SUBCUTANEOUS | Status: DC
  Administered 2015-08-25 – 2015-08-29 (×5): 50 mg via SUBCUTANEOUS
  Filled 2015-08-25: qty 0.6
  Filled 2015-08-25: qty 0.5
  Filled 2015-08-25 (×3): qty 0.6
  Filled 2015-08-25: qty 0.5

## 2015-08-25 MED ORDER — FUROSEMIDE 10 MG/ML IJ SOLN
40.0000 mg | INTRAMUSCULAR | Status: AC
  Administered 2015-08-25: 40 mg via INTRAVENOUS
  Filled 2015-08-25: qty 4

## 2015-08-25 NOTE — Telephone Encounter (Signed)
Patient's wife called and stated that patient has been experiencing shortness of breath  for the past 3 days. She stated patient is having to sit up to sleep and gets out of breath on any exertion and is having periodic periods of labored breathing. Spoke with Dr.Scott Luking and was told to advise patient to go to ER. Patient's wife verbalized understanding.

## 2015-08-25 NOTE — ED Notes (Signed)
Cardiologist in to see pt

## 2015-08-25 NOTE — Consult Note (Addendum)
Primary cardiologist: Dr [name] Consulting cardiologist: Dr [name] MD Requesting physician:  Dr Noemi Chapel Indication: SOB, bradycardia   Clinical Summary John [name] is a 70 y.o.male history of aflutter with previous RFA Nov 2012, DM2, HTN, HL presents with orthopnea. He denies any significant DOE or SOB at rest, but for the last few weeks signifcant SOB laying flat. No chest pain or palpitations. He has not noticed any significant abdominal or LE edema.      CXR pending Labs pending EKG rhythm strip: 2:1 AV block   No Known Allergies  Medications Scheduled Medications:    Infusions:    PRN Medications:     Past Medical History  Diagnosis Date  . Osteoarthritis   . Diabetes mellitus     non insulin dependant  . Hypertension   . Hyperlipidemia   . Chronic combined systolic and diastolic heart failure (HCC)     echo 01/06/11: mild LVH, EF 65-70%, mod to severe LAE, mild RVE, mild RAE, PASP 32;   TEE 10/12: EF 45-50%   . Atrial flutter ([place]) 12/2010    Admitted with symptomatic bradycardia (HR 40s), atrial flutter with slow ventricular response 12/2010 + volume overload; AV nodal agents d/c'd and Pradaxa started; RFA in 01/2011  . PSVT (paroxysmal supraventricular tachycardia) (HCC)     Possibly atrial flutter    Past Surgical History  Procedure Laterality Date  . Knee arthroscopy  2005    left  . Lumbar spine surgery      "I've had 6 ORs 1972 thru 2004"  . Cardiac electrophysiology study and ablation  02/07/11  . Atrial flutter ablation N/A 02/07/2011    Procedure: ATRIAL FLUTTER ABLATION;  Surgeon: Evans Lance, MD;  Location: [facility] CATH LAB;  Service: Cardiovascular;  Laterality: N/A;    Family History  Problem Relation Age of Onset  . Heart attack Mother   . Hypertension Mother   . Heart attack Father   . Hypertension Father   . Heart attack Brother     Social History John [name] reports that he quit smoking about 29 years  ago. His smoking use included Cigarettes. He has a 10 pack-year smoking history. He has never used smokeless tobacco. John [name] reports that he drinks alcohol.  Review of Systems CONSTITUTIONAL: No weight loss, fever, chills, weakness or fatigue.  HEENT: Eyes: No visual loss, blurred vision, double vision or yellow sclerae. No hearing loss, sneezing, congestion, runny nose or sore throat.  SKIN: No rash or itching.  CARDIOVASCULAR: per HPI.  RESPIRATORY: per HPI GASTROINTESTINAL: No anorexia, nausea, vomiting or diarrhea. No abdominal pain or blood.  GENITOURINARY: no polyuria, no dysuria NEUROLOGICAL: No headache, dizziness, syncope, paralysis, ataxia, numbness or tingling in the extremities. No change in bowel or bladder control.  MUSCULOSKELETAL: No muscle, back pain, joint pain or stiffness.  HEMATOLOGIC: No anemia, bleeding or bruising.  LYMPHATICS: No enlarged nodes. No history of splenectomy.  PSYCHIATRIC: No history of depression or anxiety.      Physical Examination Blood pressure 174/70, temperature 98.4 F (36.9 C), temperature source Oral, resp. rate 19, height 5' 11"  (1.803 m), weight 245 lb (111.131 kg). No intake or output data in the 24 hours ending 08/25/15 1011  HEENT: sclera clear, throat clear  Cardiovascular: bradycardia, regular, no m/r/g  Respiratory: CTAB  GI: abdomen soft, NT, ND  MSK: 1+ bilateral LE edema  Neuro: no focal deficits  Psych: appropriate affect   Lab Results  Basic Metabolic Panel: No  results for input(s): NA, K, CL, CO2, GLUCOSE, BUN, CREATININE, CALCIUM, MG, PHOS in the last 168 hours.  Liver Function Tests: No results for input(s): AST, ALT, ALKPHOS, BILITOT, PROT, ALBUMIN in the last 168 hours.  CBC: No results for input(s): WBC, NEUTROABS, HGB, HCT, MCV, PLT in the last 168 hours.  Cardiac Enzymes: No results for input(s): CKTOTAL, CKMB, CKMBINDEX, TROPONINI in the last 168 hours.  BNP: Invalid input(s):  POCBNP      Impression/Recommendations 1. Bradycardia - extended rhythm strip shows 2:1 AV block. Patient is asymptomatic from a heart rate standpoint, stable and actually elevated bp's.Previous EKG shows very long 1st degree AV block indicating underlying conduction disease that may have progressed.  - he will be admitted to stepdown. Pacing pads placed in case needed. We will monitor while off of his Toprol XL, if rates do not improve will need consideration for transfer to [facility] for pacemaker. Given he is asymptomatic and stable bp's with 2:1 AV block I do not see indication for transfer now.  - check TSH  2. SOB - symptoms primarily of orthopnea, on exam he does have some evidence of volume overload - we will obtain echo - would avoid aggressive diuresis given his current bradycardia, once resolved however I would anticipate diuretics  3. HTN - can continue home bp regimen, except hold Toprol  [name], M.D.   Addendum Labs back, overall unremarkable. TSH still pending. CXR with signs of pulmonary edema, he has received lasix 51m IV x 1 today. Would not diurese further, follow I/Os and heart rates.   JZandra AbtsMD

## 2015-08-25 NOTE — H&P (Signed)
History and Physical    John [name] JKD:[id number] DOB: [birth year] DOA: 08/25/2015  Referring MD/NP/PA: Noemi Chapel, EDP PCP: Mickie Hillier, MD  Patient coming from: Home  Chief Complaint: Orthopnea  HPI: John [name] is a 70 y.o. male with history of hypertension, hyperlipidemia, diabetes, prior history of a flutter status post radiofrequency ablation who presents with orthopnea. He denies any shortness of breath at rest but for the last 3 weeks of had significant shortness of breath while lying flat, he denies any chest pain or palpitation, has not noticed any abdominal or lower extremity edema, does not describe dizziness or episodes of syncope. In the ED he was found on the rhythm strip to have second-degree heart block and cardiology consultation was requested. Recommendations are to hold all of his AV nodal blocking agents including metoprolol and amlodipine admitted to the hospital for observation.  Past Medical/Surgical History: Past Medical History  Diagnosis Date  . Osteoarthritis   . Diabetes mellitus     non insulin dependant  . Hypertension   . Hyperlipidemia   . Chronic combined systolic and diastolic heart failure (HCC)     echo 01/06/11: mild LVH, EF 65-70%, mod to severe LAE, mild RVE, mild RAE, PASP 32;   TEE 10/12: EF 45-50%   . Atrial flutter ([place]) 12/2010    Admitted with symptomatic bradycardia (HR 40s), atrial flutter with slow ventricular response 12/2010 + volume overload; AV nodal agents d/c'd and Pradaxa started; RFA in 01/2011  . PSVT (paroxysmal supraventricular tachycardia) (HCC)     Possibly atrial flutter    Past Surgical History  Procedure Laterality Date  . Knee arthroscopy  2005    left  . Lumbar spine surgery      "I've had 6 ORs 1972 thru 2004"  . Cardiac electrophysiology study and ablation  02/07/11  . Atrial flutter ablation N/A 02/07/2011    Procedure: ATRIAL FLUTTER ABLATION;  Surgeon: Evans Lance, MD;  Location: [facility] CATH LAB;   Service: Cardiovascular;  Laterality: N/A;     reports that he quit smoking about 29 years ago. His smoking use included Cigarettes. He has a 10 pack-year smoking history. He has never used smokeless tobacco. He reports that he drinks alcohol. He reports that he does not use illicit drugs.  Allergies: No Known Allergies  Family History:  Family History  Problem Relation Age of Onset  . Heart attack Mother   . Hypertension Mother   . Heart attack Father   . Hypertension Father   . Heart attack Brother     Prior to Admission medications   Medication Sig Start Date End Date Taking? Authorizing Provider  amLODipine (NORVASC) 10 MG tablet Take 1 tablet (10 mg total) by mouth daily. 05/26/15  Yes Mikey Kirschner, MD  benazepril (LOTENSIN) 40 MG tablet Take 1 tablet (40 mg total) by mouth daily. 05/26/15  Yes Mikey Kirschner, MD  hydrochlorothiazide (HYDRODIURIL) 25 MG tablet Take 1 tablet (25 mg total) by mouth daily. 05/26/15  Yes Mikey Kirschner, MD  Insulin Glargine (TOUJEO SOLOSTAR) 300 UNIT/ML SOPN Inject 45 Units into the skin daily.   Yes Historical Provider, MD  Magnesium 400 MG CAPS Take by mouth.   Yes Historical Provider, MD  metoprolol succinate (TOPROL-XL) 50 MG 24 hr tablet Take 1 tablet (50 mg total) by mouth daily. Take with or immediately following a meal. 05/26/15  Yes Mikey Kirschner, MD  Multiple Vitamins-Minerals (MULTIVITAMIN WITH MINERALS) tablet Take 1 tablet by  mouth daily.   Yes Historical Provider, MD  Omega-3 Fatty Acids (FISH OIL) 1000 MG CAPS Take 1 capsule by mouth daily.   Yes Historical Provider, MD  Resveratrol 100 MG CAPS Take 1 capsule by mouth daily.   Yes Historical Provider, MD  rosuvastatin (CRESTOR) 20 MG tablet Take 1 tablet (20 mg total) by mouth daily. 05/26/15  Yes Mikey Kirschner, MD  sitaGLIPtan-metformin (JANUMET) 50-1000 MG per tablet Take 1 tablet by mouth 2 (two) times daily with a meal.     Yes Historical Provider, MD  tadalafil (CIALIS) 20 MG  tablet Take 1 tablet (20 mg total) by mouth daily as needed. For erectile dysfunction 04/17/12  Yes Yehuda [place], MD  tamsulosin (FLOMAX) 0.4 MG CAPS capsule Take 1 capsule (0.4 mg total) by mouth at bedtime. 05/26/15  Yes Mikey Kirschner, MD    Review of Systems:  Constitutional: Denies fever, chills, diaphoresis, appetite change and fatigue.  HEENT: Denies photophobia, eye pain, redness, hearing loss, ear pain, congestion, sore throat, rhinorrhea, sneezing, mouth sores, trouble swallowing, neck pain, neck stiffness and tinnitus.   Respiratory: Denies SOB, DOE, cough, chest tightness,  and wheezing.   Cardiovascular: Denies chest pain, palpitations and leg swelling.  Gastrointestinal: Denies nausea, vomiting, abdominal pain, diarrhea, constipation, blood in stool and abdominal distention.  Genitourinary: Denies dysuria, urgency, frequency, hematuria, flank pain and difficulty urinating.  Endocrine: Denies: hot or cold intolerance, sweats, changes in hair or nails, polyuria, polydipsia. Musculoskeletal: Denies myalgias, back pain, joint swelling, arthralgias and gait problem.  Skin: Denies pallor, rash and wound.  Neurological: Denies dizziness, seizures, syncope, weakness, light-headedness, numbness and headaches.  Hematological: Denies adenopathy. Easy bruising, personal or family bleeding history  Psychiatric/Behavioral: Denies suicidal ideation, mood changes, confusion, nervousness, sleep disturbance and agitation    Physical Exam: Filed Vitals:   08/25/15 1100 08/25/15 1130 08/25/15 1200 08/25/15 1511  BP: 159/64 145/69 189/93 171/65  Pulse: 25  54 40  Temp:      TempSrc:      Resp: 17 16 28    Height:      Weight:      SpO2: 95%  94% 99%     Constitutional: NAD, calm, comfortable Eyes: PERRL, lids and conjunctivae normal ENMT: Mucous membranes are moist. Posterior pharynx clear of any exudate or lesions.Normal dentition.  Neck: normal, supple, no masses, no  thyromegaly Respiratory: clear to auscultation bilaterally, no wheezing, no crackles. Normal respiratory effort. No accessory muscle use.  Cardiovascular: Bradycardic  Abdomen: no tenderness, no masses palpated. No hepatosplenomegaly. Bowel sounds positive.  Musculoskeletal: 2+ pitting edema bilaterally up to knees Skin: no rashes, lesions, ulcers. No induration Neurologic: CN 2-12 grossly intact. Sensation intact, DTR normal. Strength 5/5 in all 4.  Psychiatric: Normal judgment and insight. Alert and oriented x 3. Normal mood.    Labs on Admission: I have personally reviewed the following labs and imaging studies  CBC:  Recent Labs Lab 08/25/15 1037  WBC 10.4  NEUTROABS 6.7  HGB 13.6  HCT 41.1  MCV 85.6  PLT 119   Basic Metabolic Panel:  Recent Labs Lab 08/25/15 1037  NA 139  K 3.6  CL 110  CO2 23  GLUCOSE 209*  BUN 15  CREATININE 0.84  CALCIUM 8.7*   GFR: Estimated Creatinine Clearance: 105.2 mL/min (by C-G formula based on Cr of 0.84). Liver Function Tests:  Recent Labs Lab 08/25/15 1037  AST 24  ALT 33  ALKPHOS 58  BILITOT 1.1  PROT 6.9  ALBUMIN 3.5   No results for input(s): LIPASE, AMYLASE in the last 168 hours. No results for input(s): AMMONIA in the last 168 hours. Coagulation Profile: No results for input(s): INR, PROTIME in the last 168 hours. Cardiac Enzymes:  Recent Labs Lab 08/25/15 1037  TROPONINI 0.05*   BNP (last 3 results) No results for input(s): PROBNP in the last 8760 hours. HbA1C: No results for input(s): HGBA1C in the last 72 hours. CBG: No results for input(s): GLUCAP in the last 168 hours. Lipid Profile: No results for input(s): CHOL, HDL, LDLCALC, TRIG, CHOLHDL, LDLDIRECT in the last 72 hours. Thyroid Function Tests:  Recent Labs  08/25/15 1039  TSH 1.563   Anemia Panel: No results for input(s): VITAMINB12, FOLATE, FERRITIN, TIBC, IRON, RETICCTPCT in the last 72 hours. Urine analysis:    Component Value  Date/Time   COLORURINE YELLOW 01/05/2014 [place] 01/05/2014 0744   LABSPEC >1.030* 01/05/2014 0744   PHURINE 5.5 01/05/2014 0744   GLUCOSEU 100* 01/05/2014 0744   HGBUR SMALL* 01/05/2014 0744   BILIRUBINUR SMALL* 01/05/2014 0744   KETONESUR TRACE* 01/05/2014 0744   PROTEINUR 100* 01/05/2014 0744   UROBILINOGEN 0.2 01/05/2014 0744   NITRITE NEGATIVE 01/05/2014 0744   LEUKOCYTESUR NEGATIVE 01/05/2014 0744   Sepsis Labs: @LABRCNTIP (procalcitonin:4,lacticidven:4) )No results found for this or any previous visit (from the past 240 hour(s)).   Radiological Exams on Admission: Dg Chest Port 1 View  08/25/2015  CLINICAL DATA:  Shortness of breath. EXAM: PORTABLE CHEST 1 VIEW COMPARISON:  07/14/2012. FINDINGS: Cardiomegaly with diffuse bilateral pulmonary infiltrates consistent pulmonary edema. Bilateral pneumonia cannot be excluded. Small pleural effusions cannot be excluded. No pneumothorax. IMPRESSION: Cardiomegaly with diffuse bilateral pulmonary infiltrates consistent with pulmonary edema. Small pleural effusions cannot be excluded . Electronically Signed   By: Marcello Moores  Register   On: 08/25/2015 10:52    EKG: Independently reviewed. Junctional rhythm with a rate of 50, no acute ischemic abnormalities  Assessment/Plan Principal Problem:   Second degree heart block Active Problems:   DM type 2 causing vascular disease (HCC)   Hyperlipidemia LDL goal <100   HYPERTENSION   Bradycardia   Acute CHF (congestive heart failure) (HCC)    Bradycardia with second-degree heart block  -has already been seen in consultation by cardiology.  -plan to discontinue AV nodal blocking agents including amlodipine and Toprol-XL. -Admit to stepdown unit for close observation overnight, pacer pads are in place and atropine is at bedside. -If rates do not improve, may require transfer to [facility] for consideration of placement of pacemaker. -Given he is asymptomatic and with stable blood  pressure, cardiology believes that he does not need immediate transfer.  -TSH has been requested.  Shortness of breath, acute CHF (type unknown) -Mainsymptom is orthopnea although he does have some evidence of volume overload on exam with about 2+ pitting edema. -Echo has been requested. -Per cardiology recommendations, will hold off on further diuresis given significant bradycardia.  Hypertension -Blood pressure has been somewhat elevated, will monitor and adjust medications as needed. Please note that AV nodal blocking agents including calcium channel blocker and beta blocker have been discontinued.   Hyperlipidemia -Continue fish oil.  Type 2 diabetes -Check hemoglobin A1c, place on sliding scale insulin.   DVT prophylaxis: Lovenox  Code Status: Full code  Family Communication: Multiple family members at bedside updated on plan of care  Disposition Plan: Anticipate discharge home in 2-3 days  Consults called: Cardiology  Admission status: Inpatient    Time  Spent: 80 minutes  Lelon Frohlich MD [facility] Pager [phone]  If 7PM-7AM, please contact night-coverage [email or web address] Password [email or web address]  08/25/2015, 5:08 PM

## 2015-08-25 NOTE — ED Notes (Signed)
Sob times 2 days.

## 2015-08-25 NOTE — ED Provider Notes (Signed)
CSN: [id number]     Arrival date & time 08/25/15  4098 History  By signing my name below, I, Nicole Kindred, attest that this documentation has been prepared under the direction and in the presence of [place]*.   Electronically Signed: Nicole Kindred, ED Scribe. 08/26/2015. 7:19 AM   Chief Complaint  Patient presents with  . Shortness of Breath    The history is provided by the patient. No language interpreter was used.   HPI Comments: John [name] is a 70 y.o. male with PMHx of DM, HTN, HLD, atrial flutter, chronic combined systolic/diastolic heart failure, and cardiac ablation presents to the Emergency Department complaining of gradual onset, shortness of breath, ongoing for two days. Pt reports associated bilateral leg swelling and intermittent cough. No other associated symptoms noted. No worsening or alleviating factors noted. Pt denies fevers, chills, chest pain, abdominal pain, abdominal distension, back pain, urinary/baldder difficulty, weakness, fatigue, loss of appetite, or any other pertinent symptoms. Pt was on pradaxa but no longer takes the medication.  Past Medical History  Diagnosis Date  . Osteoarthritis   . Diabetes mellitus     non insulin dependant  . Hypertension   . Hyperlipidemia   . Chronic combined systolic and diastolic heart failure (HCC)     echo 01/06/11: mild LVH, EF 65-70%, mod to severe LAE, mild RVE, mild RAE, PASP 32;   TEE 10/12: EF 45-50%   . Atrial flutter ([place]) 12/2010    Admitted with symptomatic bradycardia (HR 40s), atrial flutter with slow ventricular response 12/2010 + volume overload; AV nodal agents d/c'd and Pradaxa started; RFA in 01/2011  . PSVT (paroxysmal supraventricular tachycardia) (HCC)     Possibly atrial flutter   Past Surgical History  Procedure Laterality Date  . Knee arthroscopy  2005    left  . Lumbar spine surgery      "I've had 6 ORs 1972 thru 2004"  . Cardiac electrophysiology study and ablation   02/07/11  . Atrial flutter ablation N/A 02/07/2011    Procedure: ATRIAL FLUTTER ABLATION;  Surgeon: Evans Lance, MD;  [facility] C[facility] LAB;  Service: Cardiovascular;  Laterality: N/A;   Family History  Problem Relation Age of Onset  . Heart attack Mother   . Hypertension Mother   . Heart attack Father   . Hypertension Father   . Heart attack Brother    Social History  Substance Use Topics  . Smoking status: Former Smoker -- 1.00 packs/day for 10 years    Types: Cigarettes    Quit date: 03/26/1986  . Smokeless tobacco: Never Used     Comment: "stopped cigarette  smoking 1988"  . Alcohol Use: 0.0 oz/week    0 Standard drinks or equivalent per week     Comment: "quit alcohol ~ 2007"    Review of Systems  Constitutional: Negative for fever, chills and appetite change.  Respiratory: Positive for cough and shortness of breath.   Cardiovascular: Positive for leg swelling. Negative for chest pain.  Gastrointestinal: Negative for abdominal pain and abdominal distention.  Genitourinary: Negative for difficulty urinating.  Musculoskeletal: Negative for back pain.  Neurological: Negative for weakness.  All other systems reviewed and are negative.   Allergies  Review of patient's allergies indicates no known allergies.  Home Medications   Prior to Admission medications   Medication Sig Start Date End Date Taking? Authorizing Provider  amLODipine (NORVASC) 10 MG tablet Take 1 tablet (10 mg total) by mouth daily. 05/26/15  Yes Mikey Kirschner, MD  benazepril (LOTENSIN) 40 MG tablet Take 1 tablet (40 mg total) by mouth daily. 05/26/15  Yes Mikey Kirschner, MD  hydrochlorothiazide (HYDRODIURIL) 25 MG tablet Take 1 tablet (25 mg total) by mouth daily. 05/26/15  Yes Mikey Kirschner, MD  Insulin Glargine (TOUJEO SOLOSTAR) 300 UNIT/ML SOPN Inject 45 Units into the skin daily.   Yes Historical Provider, MD  Magnesium 400 MG CAPS Take by mouth.   Yes Historical Provider, MD  metoprolol  succinate (TOPROL-XL) 50 MG 24 hr tablet Take 1 tablet (50 mg total) by mouth daily. Take with or immediately following a meal. 05/26/15  Yes Mikey Kirschner, MD  Multiple Vitamins-Minerals (MULTIVITAMIN WITH MINERALS) tablet Take 1 tablet by mouth daily.   Yes Historical Provider, MD  Omega-3 Fatty Acids (FISH OIL) 1000 MG CAPS Take 1 capsule by mouth daily.   Yes Historical Provider, MD  Resveratrol 100 MG CAPS Take 1 capsule by mouth daily.   Yes Historical Provider, MD  rosuvastatin (CRESTOR) 20 MG tablet Take 1 tablet (20 mg total) by mouth daily. 05/26/15  Yes Mikey Kirschner, MD  sitaGLIPtan-metformin (JANUMET) 50-1000 MG per tablet Take 1 tablet by mouth 2 (two) times daily with a meal.     Yes Historical Provider, MD  tadalafil (CIALIS) 20 MG tablet Take 1 tablet (20 mg total) by mouth daily as needed. For erectile dysfunction 04/17/12  Yes Yehuda Savannah, MD  tamsulosin (FLOMAX) 0.4 MG CAPS capsule Take 1 capsule (0.4 mg total) by mouth at bedtime. 05/26/15  Yes Mikey Kirschner, MD   BP 174/70 mmHg  Temp(Src) 98.4 F (36.9 C) (Oral)  Resp 19  Ht 5' 11"  (1.803 m)  Wt 245 lb (111.131 kg)  BMI 34.19 kg/m2 Physical Exam  Constitutional: He appears well-developed and well-nourished. No distress.  HENT:  Head: Normocephalic and atraumatic.  Mouth/Throat: Oropharynx is clear and moist. No oropharyngeal exudate.  Eyes: Conjunctivae and EOM are normal. Pupils are equal, round, and reactive to light. Right eye exhibits no discharge. Left eye exhibits no discharge. No scleral icterus.  Neck: Normal range of motion. Neck supple. JVD present. No thyromegaly present.  Mild JVD.  Cardiovascular: Normal rate, regular rhythm, normal heart sounds and intact distal pulses.  Exam reveals no gallop and no friction rub.   No murmur heard. Pulmonary/Chest: Effort normal and breath sounds normal. No respiratory distress. He has no wheezes. He has no rales.  Abdominal: Soft. Bowel sounds are normal. He  exhibits no distension and no mass. There is no tenderness.  Musculoskeletal: Normal range of motion. He exhibits edema. He exhibits no tenderness.  1+ symmetrical pitting edema below knees.  Lymphadenopathy:    He has no cervical adenopathy.  Neurological: He is alert. Coordination normal.  Skin: Skin is warm and dry. No rash noted. No erythema.  Psychiatric: He has a normal mood and affect. His behavior is normal.  Nursing note and vitals reviewed.   ED Course  Procedures (including critical care time) DIAGNOSTIC STUDIES: Oxygen Saturation is95 % on RA, Normal by my interpretation.    COORDINATION OF CARE: 10:09 AM Discussed treatment plan with pt at bedside and pt agreed to plan. 10:09 AM: Shared EKG with cardiologist.  10:36 AM: Dr. Harl Bowie, cardiologist, spoke with patient. They would like patient to stay for step down and receive an echocardiogram.  11:46 AM Dicussed patient with Dr. Jerilee Hoh who will admit patient to step down.     Labs Review  Labs Reviewed  COMPREHENSIVE METABOLIC PANEL - Abnormal; Notable for the following:    Glucose, Bld 209 (*)    Calcium 8.7 (*)    All other components within normal limits  BRAIN NATRIURETIC PEPTIDE - Abnormal; Notable for the following:    B Natriuretic Peptide 717.0 (*)    All other components within normal limits  TROPONIN I - Abnormal; Notable for the following:    Troponin I 0.05 (*)    All other components within normal limits  BASIC METABOLIC PANEL - Abnormal; Notable for the following:    Potassium 3.2 (*)    Calcium 8.8 (*)    All other components within normal limits  HEMOGLOBIN A1C - Abnormal; Notable for the following:    Hgb A1c MFr Bld 9.8 (*)    All other components within normal limits  GLUCOSE, CAPILLARY - Abnormal; Notable for the following:    Glucose-Capillary 194 (*)    All other components within normal limits  GLUCOSE, CAPILLARY - Abnormal; Notable for the following:    Glucose-Capillary 116 (*)     All other components within normal limits  MRSA PCR SCREENING  CBC WITH DIFFERENTIAL/PLATELET  TSH  CBC    Imaging Review Dg Chest Port 1 View  08/25/2015  CLINICAL DATA:  Shortness of breath. EXAM: PORTABLE CHEST 1 VIEW COMPARISON:  07/14/2012. FINDINGS: Cardiomegaly with diffuse bilateral pulmonary infiltrates consistent pulmonary edema. Bilateral pneumonia cannot be excluded. Small pleural effusions cannot be excluded. No pneumothorax. IMPRESSION: Cardiomegaly with diffuse bilateral pulmonary infiltrates consistent with pulmonary edema. Small pleural effusions cannot be excluded . Electronically Signed   By: Marcello Moores  Register   On: 08/25/2015 10:52   I have personally reviewed and evaluated these images and lab results as part of my medical decision-making.   EKG Interpretation   Date/Time:  Thursday August 25 2015 09:49:40 EDT Ventricular Rate:  33 PR Interval:    QRS Duration: 110 QT Interval:  454 QTC Calculation: 479 R Axis:   72 Text Interpretation:  Atrial fibrillation Multiple ventricular premature  complexes Probable anterior infarct, age indeterminate Confirmed by Montavious Wierzba   MD, Janesha Brissette ([id number]) on 08/25/2015 9:59:52 AM      MDM   Final diagnoses:  Acute systolic congestive heart failure (HCC)  AV block, 2nd degree    The patient presents to the hospital with what appears to be a high-grade heart block. This would either be second-degree type II or possibly third-degree though it does not have a perfect third-degree appearance. Because of the complicated nature of this EKG I have asked the cardiologist to see the patient. Dr. Harl Bowie has very kindly seen the patient and recommended admission to a high level of care, stepdown, they will perform an echocardiogram and participate with the patient's care. The patient will need to be admitted, cardiac monitoring, x-ray shows that there is congestive heart failure with pulmonary edema. Because he will need this high level of care,  critical care has been provided. Pacer pads will be placed on the patient with crash cart at the bedside just in case the patient has progression of his heart block into an unstable rhythm.  CRITICAL CARE Performed by: Johnna Acosta Total critical care time: 35 minutes Critical care time was exclusive of separately billable procedures and treating other patients. Critical care was necessary to treat or prevent imminent or life-threatening deterioration. Critical care was time spent personally by me on the following activities: development of treatment plan with patient and/or surrogate as well  as nursing, discussions with consultants, evaluation of patient's response to treatment, examination of patient, obtaining history from patient or surrogate, ordering and performing treatments and interventions, ordering and review of laboratory studies, ordering and review of radiographic studies, pulse oximetry and re-evaluation of patient's condition.   I personally performed the services described in this documentation, which was scribed in my presence. The recorded information has been reviewed and is accurate.     Noemi Chapel, MD 08/26/15 [phone]

## 2015-08-25 NOTE — Progress Notes (Signed)
*  PRELIMINARY RESULTS* Echocardiogram 2D Echocardiogram has been performed.  [name] 08/25/2015, 4:22 PM

## 2015-08-25 NOTE — ED Notes (Signed)
Pacer pads on pt with crash cart at bedside.  Pt no distress.

## 2015-08-25 NOTE — Progress Notes (Signed)
Patient arrived to [facility] 2nd floor- room 7.  Oriented patient to unit, room, and staff.  Patient on monitor and CCMD notified.  Patient is alert and oriented, no acute distress.  Wife at bedside.  Patient assessed and admission screening completed.  Will continue to monitor.

## 2015-08-26 DIAGNOSIS — I119 Hypertensive heart disease without heart failure: Secondary | ICD-10-CM | POA: Diagnosis not present

## 2015-08-26 DIAGNOSIS — I472 Ventricular tachycardia: Secondary | ICD-10-CM | POA: Diagnosis present

## 2015-08-26 DIAGNOSIS — E785 Hyperlipidemia, unspecified: Secondary | ICD-10-CM | POA: Diagnosis present

## 2015-08-26 DIAGNOSIS — Z6834 Body mass index (BMI) 34.0-34.9, adult: Secondary | ICD-10-CM | POA: Diagnosis not present

## 2015-08-26 DIAGNOSIS — I5032 Chronic diastolic (congestive) heart failure: Secondary | ICD-10-CM | POA: Diagnosis not present

## 2015-08-26 DIAGNOSIS — I441 Atrioventricular block, second degree: Secondary | ICD-10-CM | POA: Diagnosis present

## 2015-08-26 DIAGNOSIS — I442 Atrioventricular block, complete: Secondary | ICD-10-CM | POA: Diagnosis present

## 2015-08-26 DIAGNOSIS — E1159 Type 2 diabetes mellitus with other circulatory complications: Secondary | ICD-10-CM | POA: Diagnosis present

## 2015-08-26 DIAGNOSIS — I110 Hypertensive heart disease with heart failure: Secondary | ICD-10-CM | POA: Diagnosis present

## 2015-08-26 DIAGNOSIS — E876 Hypokalemia: Secondary | ICD-10-CM | POA: Diagnosis present

## 2015-08-26 DIAGNOSIS — I5043 Acute on chronic combined systolic (congestive) and diastolic (congestive) heart failure: Secondary | ICD-10-CM | POA: Diagnosis present

## 2015-08-26 DIAGNOSIS — R001 Bradycardia, unspecified: Secondary | ICD-10-CM | POA: Diagnosis present

## 2015-08-26 DIAGNOSIS — M1990 Unspecified osteoarthritis, unspecified site: Secondary | ICD-10-CM | POA: Diagnosis present

## 2015-08-26 DIAGNOSIS — E669 Obesity, unspecified: Secondary | ICD-10-CM | POA: Diagnosis present

## 2015-08-26 DIAGNOSIS — I4892 Unspecified atrial flutter: Secondary | ICD-10-CM | POA: Diagnosis present

## 2015-08-26 DIAGNOSIS — Z794 Long term (current) use of insulin: Secondary | ICD-10-CM | POA: Diagnosis not present

## 2015-08-26 DIAGNOSIS — Z87891 Personal history of nicotine dependence: Secondary | ICD-10-CM | POA: Diagnosis not present

## 2015-08-26 LAB — GLUCOSE, CAPILLARY
GLUCOSE-CAPILLARY: 156 mg/dL — AB (ref 65–99)
GLUCOSE-CAPILLARY: 106 mg/dL — AB (ref 65–99)
GLUCOSE-CAPILLARY: 137 mg/dL — AB (ref 65–99)
Glucose-Capillary: 115 mg/dL — ABNORMAL HIGH (ref 65–99)
Glucose-Capillary: 84 mg/dL (ref 65–99)

## 2015-08-26 LAB — CBC
HCT: 41.2 % (ref 39.0–52.0)
Hemoglobin: 13.5 g/dL (ref 13.0–17.0)
MCH: 27.9 pg (ref 26.0–34.0)
MCHC: 32.8 g/dL (ref 30.0–36.0)
MCV: 85.1 fL (ref 78.0–100.0)
PLATELETS: 219 10*3/uL (ref 150–400)
RBC: 4.84 MIL/uL (ref 4.22–5.81)
RDW: 14.5 % (ref 11.5–15.5)
WBC: 10 10*3/uL (ref 4.0–10.5)

## 2015-08-26 LAB — HEMOGLOBIN A1C
HEMOGLOBIN A1C: 9.8 % — AB (ref 4.8–5.6)
Mean Plasma Glucose: 235 mg/dL

## 2015-08-26 LAB — BASIC METABOLIC PANEL
Anion gap: 7 (ref 5–15)
BUN: 13 mg/dL (ref 6–20)
CALCIUM: 8.8 mg/dL — AB (ref 8.9–10.3)
CO2: 27 mmol/L (ref 22–32)
CREATININE: 0.74 mg/dL (ref 0.61–1.24)
Chloride: 109 mmol/L (ref 101–111)
GFR calc Af Amer: >60 mL/min (ref >60)
GFR calc non Af Amer: >60 mL/min (ref >60)
GLUCOSE: 91 mg/dL (ref 65–99)
Potassium: 3.2 mmol/L — ABNORMAL LOW (ref 3.5–5.1)
Sodium: 143 mmol/L (ref 135–145)

## 2015-08-26 LAB — TROPONIN I: Troponin I: 0.07 ng/mL — ABNORMAL HIGH (ref <0.031)

## 2015-08-26 LAB — MRSA PCR SCREENING: MRSA by PCR: NEGATIVE

## 2015-08-26 MED ORDER — FAMOTIDINE IN NACL 20-0.9 MG/50ML-% IV SOLN: 20.0000 mg | Freq: Two times a day (BID) | INTRAVENOUS | Status: DC

## 2015-08-26 MED ORDER — POTASSIUM CHLORIDE CRYS ER 20 MEQ PO TBCR
40.0000 meq | EXTENDED_RELEASE_TABLET | Freq: Three times a day (TID) | ORAL | Status: AC
  Administered 2015-08-26 (×2): 40 meq via ORAL
  Filled 2015-08-26 (×2): qty 2

## 2015-08-26 MED ORDER — FAMOTIDINE 20 MG PO TABS
20.0000 mg | ORAL_TABLET | Freq: Two times a day (BID) | ORAL | Status: DC
  Administered 2015-08-27 – 2015-08-30 (×7): 20 mg via ORAL
  Filled 2015-08-26 (×7): qty 1

## 2015-08-26 NOTE — Consult Note (Signed)
ELECTROPHYSIOLOGY CONSULT NOTE    Patient ID: John [name] MRN: [id number], DOB/AGE: 1945-04-03 70 y.o.  Admit date: 08/25/2015 Date of Consult: 08/26/2015  Primary Physician: Mickie Hillier, MD Primary Cardiologist: Dr. Bronson Ing Requesting MD: Dr. Harl Bowie  Reason for Consultation: CHB, evaluate for PPM implant  HPI: John [name] is a 70 y.o. male [facility] where [facility] arrived with c/o SOB and symptoms of orthopnea.  He was noted to be in CHB and transferred to [facility] for further care and management, EP evaluation for pacer.  He has PMHx HTN, HLD, DM, PAFlutter s/p ablation in 2012, not on a/c.  The patient reports that he was feeling SOB particularly at night and needed to add pillow to put his head up some to feel like he could breath easier, he had no daytime symptoms, no SOB otherwise, no palpitations, no DOE.  He reports the other day mowed his lawn and has been able to do his usual activities without difficulty.  He has not had any kind of dizziness, near syncope or syncope,  He has not had any CP.  Admit labs: K+ 3.6  >> 3.2 (replacment in progress) BUN/Creat 15/0.84 >> 13/0.74 Trop 0.05, 0.07 H/H 13/41, wbc 10.4 >> 10 TSH 1.563 P.BNP 717  Home meds include Toprol XL 46m daily, last dose was 08/25/15 AM  Past Medical History  Diagnosis Date  . Osteoarthritis   . Diabetes mellitus     non insulin dependant  . Hypertension   . Hyperlipidemia   . Chronic combined systolic and diastolic heart failure (HCC)     echo 01/06/11: mild LVH, EF 65-70%, mod to severe LAE, mild RVE, mild RAE, PASP 32;   TEE 10/12: EF 45-50%   . Atrial flutter ([place] 12/2010    Admitted with symptomatic bradycardia (HR 40s), atrial flutter with slow ventricular response 12/2010 + volume overload; AV nodal agents d/c'd and Pradaxa started; RFA in 01/2011  . PSVT (paroxysmal supraventricular tachycardia) (HCC)     Possibly atrial flutter     Surgical History:  Past Surgical History    Procedure Laterality Date  . Knee arthroscopy  2005    left  . Lumbar spine surgery      "I've had 6 ORs 1972 thru 2004"  . Cardiac electrophysiology study and ablation  02/07/11  . Atrial flutter ablation N/A 02/07/2011    Procedure: ATRIAL FLUTTER ABLATION;  Surgeon: GEvans Lance MD;  [facility];  Service: Cardiovascular;  Laterality: N/A;     Prescriptions prior to admission  Medication Sig Dispense Refill Last Dose  . amLODipine (NORVASC) 10 MG tablet Take 1 tablet (10 mg total) by mouth daily. 90 tablet 1 08/25/2015 at Unknown time  . benazepril (LOTENSIN) 40 MG tablet Take 1 tablet (40 mg total) by mouth daily. 90 tablet 1 08/25/2015 at Unknown time  . hydrochlorothiazide (HYDRODIURIL) 25 MG tablet Take 1 tablet (25 mg total) by mouth daily. 90 tablet 1 08/25/2015 at Unknown time  . Insulin Glargine (TOUJEO SOLOSTAR) 300 UNIT/ML SOPN Inject 45 Units into the skin daily.   08/25/2015 at Unknown time  . Magnesium 400 MG CAPS Take by mouth.   08/24/2015 at Unknown time  . metoprolol succinate (TOPROL-XL) 50 MG 24 hr tablet Take 1 tablet (50 mg total) by mouth daily. Take with or immediately following a meal. 90 tablet 1 08/25/2015 at 0630  . Multiple Vitamins-Minerals (MULTIVITAMIN WITH MINERALS) tablet Take 1 tablet by mouth daily.  08/24/2015 at Unknown time  . Omega-3 Fatty Acids (FISH OIL) 1000 MG CAPS Take 1 capsule by mouth daily.   08/24/2015 at Unknown time  . Resveratrol 100 MG CAPS Take 1 capsule by mouth daily.   08/24/2015 at Unknown time  . rosuvastatin (CRESTOR) 20 MG tablet Take 1 tablet (20 mg total) by mouth daily. 90 tablet 1 08/25/2015 at Unknown time  . sitaGLIPtan-metformin (JANUMET) 50-1000 MG per tablet Take 1 tablet by mouth 2 (two) times daily with a meal.     08/25/2015 at Unknown time  . tadalafil (CIALIS) 20 MG tablet Take 1 tablet (20 mg total) by mouth daily as needed. For erectile dysfunction 10 tablet 6 unknown  . tamsulosin (FLOMAX) 0.4 MG CAPS capsule Take  1 capsule (0.4 mg total) by mouth at bedtime. 90 capsule 1 08/24/2015 at Unknown time    Inpatient Medications:  . benazepril  40 mg Oral Daily  . enoxaparin (LOVENOX) injection  50 mg Subcutaneous Q24H  . hydrochlorothiazide  25 mg Oral Daily  . insulin aspart  0-15 Units Subcutaneous TID WC  . insulin aspart  0-5 Units Subcutaneous QHS  . insulin aspart  4 Units Subcutaneous TID WC  . insulin glargine  45 Units Subcutaneous q1800  . multivitamin with minerals  1 tablet Oral Daily  . omega-3 acid ethyl esters  1 g Oral Daily  . potassium chloride  40 mEq Oral Q8H  . rosuvastatin  20 mg Oral q1800  . sodium chloride flush  3 mL Intravenous Q12H  . tamsulosin  0.4 mg Oral QHS    Allergies: No Known Allergies  Social History   Social History  . Marital Status: Married    Spouse Name: N/A  . Number of Children: N/A  . Years of Education: N/A   Occupational History  . Designer, industrial/product    Social History Main Topics  . Smoking status: Former Smoker -- 1.00 packs/day for 10 years    Types: Cigarettes    Quit date: 03/26/1986  . Smokeless tobacco: Never Used     Comment: "stopped cigarette  smoking 1988"  . Alcohol Use: 0.0 oz/week    0 Standard drinks or equivalent per week     Comment: "quit alcohol ~ 2007"  . Drug Use: No  . Sexual Activity: Not on file   Other Topics Concern  . Not on file   Social History Narrative     Family History  Problem Relation Age of Onset  . Heart attack Mother   . Hypertension Mother   . Heart attack Father   . Hypertension Father   . Heart attack Brother      Review of Systems: All other systems reviewed and are otherwise negative except as noted above.  Physical Exam: Filed Vitals:   08/26/15 1150 08/26/15 1200 08/26/15 1215 08/26/15 1230  BP:  144/68 141/89 149/69  Pulse:  44 43 74  Temp: 98.1 F (36.7 C)     TempSrc: Oral     Resp:  14 17 14   Height:      Weight:      SpO2:  98% 97% 98%    GEN- The patient is  well appearing, alert and oriented x 3 today.   HEENT: normocephalic, atraumatic; sclera clear, conjunctiva pink; hearing intact; oropharynx clear; neck supple, no JVP Lymph- no cervical lymphadenopathy Lungs- Clear to ausculation bilaterally, normal work of breathing.  No wheezes, rales, rhonchi Heart- Regular rate and rhythm, no murmurs, rubs or gallops,  PMI not laterally displaced GI- soft, non-tender, non-distended, bowel sounds present Extremities- no clubbing, cyanosis, or edema; DP/PT/radial pulses 2+ bilaterally MS- no significant deformity or atrophy Skin- warm and dry, no rash or lesion Psych- euthymic mood, full affect Neuro- no gross deficits observed  Labs:   Lab Results  Component Value Date   WBC 10.0 08/26/2015   HGB 13.5 08/26/2015   HCT 41.2 08/26/2015   MCV 85.1 08/26/2015   PLT 219 08/26/2015    Recent Labs Lab 08/25/15 1037 08/26/15 0423  NA 139 143  K 3.6 3.2*  CL 110 109  CO2 23 27  BUN 15 13  CREATININE 0.84 0.74  CALCIUM 8.7* 8.8*  PROT 6.9  --   BILITOT 1.1  --   ALKPHOS 58  --   ALT 33  --   AST 24  --   GLUCOSE 209* 91      Radiology/Studies:  Dg Chest Port 1 View 08/25/2015  CLINICAL DATA:  Shortness of breath. EXAM: PORTABLE CHEST 1 VIEW COMPARISON:  07/14/2012. FINDINGS: Cardiomegaly with diffuse bilateral pulmonary infiltrates consistent pulmonary edema. Bilateral pneumonia cannot be excluded. Small pleural effusions cannot be excluded. No pneumothorax. IMPRESSION: Cardiomegaly with diffuse bilateral pulmonary infiltrates consistent with pulmonary edema. Small pleural effusions cannot be excluded . Electronically Signed   By: Marcello Moores  Register   On: 08/25/2015 10:52    EKG: #1 EKG is SR, V ectopy makes difficult to establish degree of AVBlock #2 appears to have 2:1 conduction #3 with som eloss of AV dissociation 05/26/15 is SR with second degree type I TELEMETRY: variable heart block, appears second degree both I and II, as well as  periods of CHB, currently is wenckebach 50's talking with friends  08/25/15: Echocardiogram  Study Conclusions - Left ventricle: The cavity size was normal. Wall thickness was  increased in a pattern of mild LVH. Systolic function was normal.  The estimated ejection fraction was in the range of 50% to 55%.  There is hypokinesis of the basalinferolateral myocardium. - Aortic valve: Mildly calcified annulus. Trileaflet; mildly to  moderately calcified leaflets. Left coronary cusp mobility was  severely restricted. - Mitral valve: Calcified annulus. There was mild regurgitation. - Left atrium: The atrium was moderately dilated. - Right ventricle: Systolic function was reduced. - Right atrium: Central venous pressure (est): 15 mm Hg. - Tricuspid valve: There was trivial regurgitation. - Pulmonary arteries: PA peak pressure: 59 mm Hg (S). - Pericardium, extracardiac: There was no pericardial effusion. Impressions: - Mild LVH with LVEF 50-55%, basal inferolateral hypokinesis noted.  Moderate left atrial enlargement. MAC with mild mitral  regurgitation. Sclerotic aortic valve with restricted left  coronary cusp motion. Reduced RV contraction. Trivial tricuspid  regurgitation with PASP 59 mmHg. Telemetry lead during this study  suggests that the patient might be in complete heart block with  heart rate 40 bpm, would confirm by formal telemetry and  follow-up ECG. Discussed with Dr. Jerilee Hoh and cardiology  service already involved.  Assessment and Plan:   1. CHB     HR 30's-40's     Last dose of home Toprol was 08/25/15 AM prior to going to the hospital, was held since     Discussed PPM implant with the patient, risk/benefits and he is agreeable to proceed if needed      Pending Dr. Curt Bears  2. CHF     Likely secondary to bradycardia     S/p lasix and symptoms resolved     Exam negative for  fluid OL  3. HTN     Stable  4. DM   Signed, Tommye Standard,  PA-C 08/26/2015 1:04 PM    I have seen and examined this patient with Tommye Standard.  Agree with above, note added to reflect my findings.  On exam, irregular rhythm, no murmurs, lungs clear.  Transferred due to junctional rhythm.  Had taken Toprol XL on Thursday morning and now with sinus rhythm and mobitz I AV block.  Toprol has not washed out to this point.  Will therefor continue to watch for 5 half lives of the drug.  May require pacemaker if has symptomatic mobitz I AV block.  Will M. Camnitz MD 08/26/2015 5:00 PM

## 2015-08-26 NOTE — Progress Notes (Signed)
**Note De-Identified Sumayyah Custodio Obfuscation** EKG complete and abnormal results reported to RN and placed in patients chart.

## 2015-08-26 NOTE — Progress Notes (Signed)
Report called to [place] on 2h at [facility], and carelink RN. Pt assisted to stretcher with carelink. IV flushed without difficulty and saline locked, BG upon transfer 156 mg/dl. Pt left unit in stable condition.

## 2015-08-26 NOTE — Progress Notes (Addendum)
Patient ID: John [name], male   DOB: [birth year], 70 y.o.   MRN: [id number]     Subjective:   No complaints   Objective:   Temp:  [97.5 F (36.4 C)-99.1 F (37.3 C)] 97.5 F (36.4 C) (06/02 0800) Pulse Rate:  [25-110] 81 (06/02 0600) Resp:  [12-28] 12 (06/02 0600) BP: (141-189)/(55-93) 141/71 mmHg (06/02 0600) SpO2:  [89 %-99 %] 98 % (06/02 0600) Weight:  [243 lb 2.7 oz (110.3 kg)-245 lb (111.131 kg)] 243 lb 2.7 oz (110.3 kg) (06/02 0500) Last BM Date: 08/25/15  Filed Weights   08/25/15 1001 08/26/15 0500  Weight: 245 lb (111.131 kg) 243 lb 2.7 oz (110.3 kg)    Intake/Output Summary (Last 24 hours) at 08/26/15 0819 Last data filed at 08/26/15 0719  Gross per 24 hour  Intake    120 ml  Output   3525 ml  Net  -3405 ml    Telemetry: Sinus rhythm with complete heart block  Exam:  General: NAD  HEENT: sclera clear, throat clear  Resp: CTAB  Cardiac: regular, rate 50, 2/6 systolic murmur RUSB  GI: abdomen soft, NT, ND  MSK: no LE edema  Neuro: no focal deficits  Psych: appropriate affect  Lab Results:  Basic Metabolic Panel:  Recent Labs Lab 08/25/15 1037 08/26/15 0423  NA 139 143  K 3.6 3.2*  CL 110 109  CO2 23 27  GLUCOSE 209* 91  BUN 15 13  CREATININE 0.84 0.74  CALCIUM 8.7* 8.8*    Liver Function Tests:  Recent Labs Lab 08/25/15 1037  AST 24  ALT 33  ALKPHOS 58  BILITOT 1.1  PROT 6.9  ALBUMIN 3.5    CBC:  Recent Labs Lab 08/25/15 1037 08/26/15 0423  WBC 10.4 10.0  HGB 13.6 13.5  HCT 41.1 41.2  MCV 85.6 85.1  PLT 212 219    Cardiac Enzymes:  Recent Labs Lab 08/25/15 1037  TROPONINI 0.05*    BNP: No results for input(s): PROBNP in the last 8760 hours.  Coagulation: No results for input(s): INR in the last 168 hours.  ECG:   Medications:   Scheduled Medications: . benazepril  40 mg Oral Daily  . enoxaparin (LOVENOX) injection  50 mg Subcutaneous Q24H  . hydrochlorothiazide  25 mg Oral Daily  . insulin  aspart  0-15 Units Subcutaneous TID WC  . insulin aspart  0-5 Units Subcutaneous QHS  . insulin aspart  4 Units Subcutaneous TID WC  . insulin glargine  45 Units Subcutaneous q1800  . multivitamin with minerals  1 tablet Oral Daily  . omega-3 acid ethyl esters  1 g Oral Daily  . rosuvastatin  20 mg Oral q1800  . sodium chloride flush  3 mL Intravenous Q12H  . tamsulosin  0.4 mg Oral QHS     Infusions:     PRN Medications:  sodium chloride, acetaminophen **OR** acetaminophen, ondansetron **OR** ondansetron (ZOFRAN) IV, senna-docusate, sodium chloride flush     Assessment/Plan    1. Bradycardia  - off Toprol XL for approx 24 hrs at this point, remains bradycardic - 12 lead EKGs interpretation affected by low voltage P-wave, Pwaves mainly only visible in the anterior precordial leads. Telemetry review clearly shows distinct P-waves are present.  Further telemetry from overnight shows what was thought initially to be 2:1 AV block is more consistent with complete heart block. BP's remains stable, surprisingly patient has been asymptomatic. - we will plan for transfer to [facility] for EP evaluation and possible  pacemaker  2. Pulmonary edema - primary compliant on admission was orthopnea. CXR with pulmonary edema. Echo with LVEF 50-55%, cannot evalute diastolic function though mod LAE suggests abnormal -given lasix 1m IV x 1 yesterday with improved symptoms. Negative 3.4 liters. No plans for further diuresis at this time, will need prn lasix at discharge.   3. Hypokalemia - will replace with oral KCl   JCarlyle Dolly M.D.

## 2015-08-27 LAB — GLUCOSE, CAPILLARY
GLUCOSE-CAPILLARY: 83 mg/dL (ref 65–99)
GLUCOSE-CAPILLARY: 136 mg/dL — AB (ref 65–99)
GLUCOSE-CAPILLARY: 115 mg/dL — AB (ref 65–99)
Glucose-Capillary: 125 mg/dL — ABNORMAL HIGH (ref 65–99)

## 2015-08-27 LAB — BASIC METABOLIC PANEL
ANION GAP: 7 (ref 5–15)
BUN: 10 mg/dL (ref 6–20)
CALCIUM: 8.8 mg/dL — AB (ref 8.9–10.3)
CHLORIDE: 107 mmol/L (ref 101–111)
CO2: 26 mmol/L (ref 22–32)
Creatinine, Ser: 0.83 mg/dL (ref 0.61–1.24)
GFR calc non Af Amer: >60 mL/min (ref >60)
GLUCOSE: 86 mg/dL (ref 65–99)
Potassium: 3.9 mmol/L (ref 3.5–5.1)
Sodium: 140 mmol/L (ref 135–145)

## 2015-08-27 LAB — PHOSPHORUS: PHOSPHORUS: 4.2 mg/dL (ref 2.5–4.6)

## 2015-08-27 LAB — MAGNESIUM: Magnesium: 2.2 mg/dL (ref 1.7–2.4)

## 2015-08-27 NOTE — Progress Notes (Signed)
Patient ID: John [name], male   DOB: Mar 28, 1945, 70 y.o.   MRN: [id number]    Patient Name: John [name] Date of Encounter: 08/27/2015     Principal Problem:   Second degree heart block Active Problems:   DM type 2 causing vascular disease ([employer])   Hyperlipidemia LDL goal <100   HYPERTENSION   Bradycardia   Acute CHF (congestive heart failure) (HCC)    SUBJECTIVE  No chest pain or sob.   CURRENT MEDS . benazepril  40 mg Oral Daily  . enoxaparin (LOVENOX) injection  50 mg Subcutaneous Q24H  . famotidine  20 mg Oral BID  . hydrochlorothiazide  25 mg Oral Daily  . insulin aspart  0-15 Units Subcutaneous TID WC  . insulin aspart  0-5 Units Subcutaneous QHS  . insulin aspart  4 Units Subcutaneous TID WC  . insulin glargine  45 Units Subcutaneous q1800  . multivitamin with minerals  1 tablet Oral Daily  . omega-3 acid ethyl esters  1 g Oral Daily  . rosuvastatin  20 mg Oral q1800  . sodium chloride flush  3 mL Intravenous Q12H  . tamsulosin  0.4 mg Oral QHS    OBJECTIVE  Filed Vitals:   08/27/15 0400 08/27/15 0500 08/27/15 0600 08/27/15 0700  BP: 150/72 152/68    Pulse: 92 50    Temp:    98.3 F (36.8 C)  TempSrc:    Oral  Resp: 16 18 12 12   Height:      Weight:      SpO2: 94% 96%      Intake/Output Summary (Last 24 hours) at 08/27/15 0934 Last data filed at 08/27/15 0350  Gross per 24 hour  Intake    240 ml  Output   1425 ml  Net  -1185 ml   Filed Weights   08/25/15 1001 08/26/15 0500 08/26/15 1130  Weight: 245 lb (111.131 kg) 243 lb 2.7 oz (110.3 kg) 244 lb 4.3 oz (110.8 kg)    PHYSICAL EXAM  General: Pleasant, NAD. Neuro: Alert and oriented X 3. Moves all extremities spontaneously. Psych: Normal affect. HEENT:  Normal  Neck: Supple without bruits or JVD. Lungs:  Resp regular and unlabored, CTA. Heart: IRRR no s3, s4, or murmurs. Abdomen: Soft, non-tender, non-distended, BS + x 4.  Extremities: No clubbing, cyanosis or edema. DP/PT/Radials  2+ and equal bilaterally.  Accessory Clinical Findings  CBC  Recent Labs  08/25/15 1037 08/26/15 0423  WBC 10.4 10.0  NEUTROABS 6.7  --   HGB 13.6 13.5  HCT 41.1 41.2  MCV 85.6 85.1  PLT 212 329   Basic Metabolic Panel  Recent Labs  08/26/15 0423 08/27/15 0753  NA 143 140  K 3.2* 3.9  CL 109 107  CO2 27 26  GLUCOSE 91 86  BUN 13 10  CREATININE 0.74 0.83  CALCIUM 8.8* 8.8*  MG  --  2.2  PHOS  --  4.2   Liver Function Tests  Recent Labs  08/25/15 1037  AST 24  ALT 33  ALKPHOS 58  BILITOT 1.1  PROT 6.9  ALBUMIN 3.5   No results for input(s): LIPASE, AMYLASE in the last 72 hours. Cardiac Enzymes  Recent Labs  08/25/15 1037 08/26/15 0611  TROPONINI 0.05* 0.07*   BNP Invalid input(s): POCBNP D-Dimer No results for input(s): DDIMER in the last 72 hours. Hemoglobin A1C  Recent Labs  08/25/15 1037  HGBA1C 9.8*   Fasting Lipid Panel No results for input(s): CHOL, HDL,  LDLCALC, TRIG, CHOLHDL, LDLDIRECT in the last 72 hours. Thyroid Function Tests  Recent Labs  08/25/15 1039  TSH 1.563    TELE  nsr with AVWB  Radiology/Studies  Dg Chest Port 1 View  08/25/2015  CLINICAL DATA:  Shortness of breath. EXAM: PORTABLE CHEST 1 VIEW COMPARISON:  07/14/2012. FINDINGS: Cardiomegaly with diffuse bilateral pulmonary infiltrates consistent pulmonary edema. Bilateral pneumonia cannot be excluded. Small pleural effusions cannot be excluded. No pneumothorax. IMPRESSION: Cardiomegaly with diffuse bilateral pulmonary infiltrates consistent with pulmonary edema. Small pleural effusions cannot be excluded . Electronically Signed   By: [place]   On: 08/25/2015 10:52    ASSESSMENT AND PLAN  1. ChB - his conduction has improved off of metoprolol. Will follow. 2. HTN -his blood pressure is stable but chronically elevated 3. VT - he has had NSVT which is asymptomatic. Will follow. 4. Disp. - ambulate today and dc home tomorrow if heart rhythm without  symptomatic AV block.  Gregg Taylor,M.D.  08/27/2015 9:34 AM

## 2015-08-28 DIAGNOSIS — R7989 Other specified abnormal findings of blood chemistry: Secondary | ICD-10-CM

## 2015-08-28 DIAGNOSIS — R778 Other specified abnormalities of plasma proteins: Secondary | ICD-10-CM

## 2015-08-28 DIAGNOSIS — I5032 Chronic diastolic (congestive) heart failure: Secondary | ICD-10-CM

## 2015-08-28 DIAGNOSIS — I119 Hypertensive heart disease without heart failure: Secondary | ICD-10-CM

## 2015-08-28 DIAGNOSIS — I4729 Other ventricular tachycardia: Secondary | ICD-10-CM

## 2015-08-28 DIAGNOSIS — I472 Ventricular tachycardia: Secondary | ICD-10-CM

## 2015-08-28 LAB — GLUCOSE, CAPILLARY
GLUCOSE-CAPILLARY: 160 mg/dL — AB (ref 65–99)
GLUCOSE-CAPILLARY: 102 mg/dL — AB (ref 65–99)
Glucose-Capillary: 150 mg/dL — ABNORMAL HIGH (ref 65–99)
Glucose-Capillary: 209 mg/dL — ABNORMAL HIGH (ref 65–99)

## 2015-08-28 MED ORDER — SODIUM CHLORIDE 0.9 % IV SOLN
INTRAVENOUS | Status: DC
  Administered 2015-08-29: 06:00:00 via INTRAVENOUS

## 2015-08-28 MED ORDER — SODIUM CHLORIDE 0.9% FLUSH
3.0000 mL | Freq: Two times a day (BID) | INTRAVENOUS | Status: DC
  Administered 2015-08-28 – 2015-08-29 (×2): 3 mL via INTRAVENOUS

## 2015-08-28 MED ORDER — CHLORHEXIDINE GLUCONATE 4 % EX LIQD
60.0000 mL | Freq: Once | CUTANEOUS | Status: AC
  Administered 2015-08-28: 4 via TOPICAL
  Filled 2015-08-28: qty 15

## 2015-08-28 MED ORDER — SODIUM CHLORIDE 0.9% FLUSH: 3.0000 mL | INTRAVENOUS | Status: DC | PRN

## 2015-08-28 MED ORDER — SODIUM CHLORIDE 0.9 % IR SOLN
80.0000 mg | Status: AC
  Administered 2015-08-29: 80 mg
  Filled 2015-08-28: qty 2

## 2015-08-28 MED ORDER — SODIUM CHLORIDE 0.9 % IV SOLN: 250.0000 mL | INTRAVENOUS | Status: DC

## 2015-08-28 MED ORDER — GUAIFENESIN ER 600 MG PO TB12
600.0000 mg | ORAL_TABLET | Freq: Two times a day (BID) | ORAL | Status: DC
  Administered 2015-08-28 – 2015-08-30 (×5): 600 mg via ORAL
  Filled 2015-08-28 (×5): qty 1

## 2015-08-28 MED ORDER — CEFAZOLIN SODIUM-DEXTROSE 2-4 GM/100ML-% IV SOLN
2.0000 g | INTRAVENOUS | Status: DC
  Filled 2015-08-28: qty 100

## 2015-08-28 NOTE — Progress Notes (Signed)
Patient Name: John [name] Date of Encounter: 08/28/2015  Hospital Problem List     Principal Problem:   Second degree heart block Active Problems:   Bradycardia   DM type 2 causing vascular disease (HCC)   Chronic diastolic CHF (congestive heart failure) (HCC)   Hypertensive heart disease   NSVT (nonsustained ventricular tachycardia) (HCC)   Elevated troponin   Hyperlipidemia LDL goal <100    Subjective   No chest pain, sob, presyncope.  Remains bradycardic with 2:1 HB.  Inpatient Medications    . benazepril  40 mg Oral Daily  . enoxaparin (LOVENOX) injection  50 mg Subcutaneous Q24H  . famotidine  20 mg Oral BID  . hydrochlorothiazide  25 mg Oral Daily  . insulin aspart  0-15 Units Subcutaneous TID WC  . insulin aspart  0-5 Units Subcutaneous QHS  . insulin aspart  4 Units Subcutaneous TID WC  . insulin glargine  45 Units Subcutaneous q1800  . multivitamin with minerals  1 tablet Oral Daily  . omega-3 acid ethyl esters  1 g Oral Daily  . rosuvastatin  20 mg Oral q1800  . sodium chloride flush  3 mL Intravenous Q12H  . tamsulosin  0.4 mg Oral QHS    Vital Signs    Filed Vitals:   08/28/15 0500 08/28/15 0600 08/28/15 0700 08/28/15 0800  BP: 148/63 150/60 139/67 148/65  Pulse: 40 40 46 44  Temp:      TempSrc:      Resp: 19 20 17 18   Height:      Weight:      SpO2: 96% 93% 95% 97%    Intake/Output Summary (Last 24 hours) at 08/28/15 0839 Last data filed at 08/28/15 0600  Gross per 24 hour  Intake    540 ml  Output   1000 ml  Net   -460 ml   Filed Weights   08/25/15 1001 08/26/15 0500 08/26/15 1130  Weight: 245 lb (111.131 kg) 243 lb 2.7 oz (110.3 kg) 244 lb 4.3 oz (110.8 kg)    Physical Exam    General: Pleasant, NAD. Neuro: Alert and oriented X 3. Moves all extremities spontaneously. Psych: Normal affect. HEENT:  Normal  Neck: Supple without bruits or JVD. Lungs:  Resp regular and unlabored, CTA. Heart: RRR no s3, s4, 2/6 syst murmur  throughout. Abdomen: Soft, non-tender, non-distended, BS + x 4.  Extremities: No clubbing, cyanosis or edema. DP/PT/Radials 2+ and equal bilaterally.  Labs    CBC  Recent Labs  08/25/15 1037 08/26/15 0423  WBC 10.4 10.0  NEUTROABS 6.7  --   HGB 13.6 13.5  HCT 41.1 41.2  MCV 85.6 85.1  PLT 212 833   Basic Metabolic Panel  Recent Labs  08/26/15 0423 08/27/15 0753  NA 143 140  K 3.2* 3.9  CL 109 107  CO2 27 26  GLUCOSE 91 86  BUN 13 10  CREATININE 0.74 0.83  CALCIUM 8.8* 8.8*  MG  --  2.2  PHOS  --  4.2   Liver Function Tests  Recent Labs  08/25/15 1037  AST 24  ALT 33  ALKPHOS 58  BILITOT 1.1  PROT 6.9  ALBUMIN 3.5   Cardiac Enzymes  Recent Labs  08/25/15 1037 08/26/15 0611  TROPONINI 0.05* 0.07*   Hemoglobin A1C  Recent Labs  08/25/15 1037  HGBA1C 9.8*   Thyroid Function Tests  Recent Labs  08/25/15 1039  TSH 1.563    Telemetry    2:1 HB  with intermittent CHB - rates dipping into 30's.  Up to 6 beats NSVT.  Radiology    Dg Chest Port 1 View  08/25/2015  CLINICAL DATA:  Shortness of breath. EXAM: PORTABLE CHEST 1 VIEW COMPARISON:  07/14/2012. FINDINGS: Cardiomegaly with diffuse bilateral pulmonary infiltrates consistent pulmonary edema. Bilateral pneumonia cannot be excluded. Small pleural effusions cannot be excluded. No pneumothorax. IMPRESSION: Cardiomegaly with diffuse bilateral pulmonary infiltrates consistent with pulmonary edema. Small pleural effusions cannot be excluded . Electronically Signed   By: Marcello Moores  Register   On: 08/25/2015 10:52   2D Echocardiogram 6.1.2017  Study Conclusions   - Left ventricle: The cavity size was normal. Wall thickness was   increased in a pattern of mild LVH. Systolic function was normal.   The estimated ejection fraction was in the range of 50% to 55%.   There is hypokinesis of the basalinferolateral myocardium. - Aortic valve: Mildly calcified annulus. Trileaflet; mildly to   moderately  calcified leaflets. Left coronary cusp mobility was   severely restricted. - Mitral valve: Calcified annulus. There was mild regurgitation. - Left atrium: The atrium was moderately dilated. - Right ventricle: Systolic function was reduced. - Right atrium: Central venous pressure (est): 15 mm Hg. - Tricuspid valve: There was trivial regurgitation. - Pulmonary arteries: PA peak pressure: 59 mm Hg (S). - Pericardium, extracardiac: There was no pericardial effusion.   Assessment & Plan    1. Complete Heart Block:   blocker on hold now since 6/1.  He continues to have high grade heart block with intermittent 2:1 HB and CHB with ongoing bradycardia and rates frequently in the high 30's.  Will plan on PPM tomorrow.    2.  Hypertensive Heart Disease:  BP trending 130's to 140's.  Cont acei/hctz.  Would not treat any more aggressively @ this point in setting of #1.  3. Chronic Diastolic CHF:  NL LV fxn on echo 6/1. Wt stable.  Euvolemic on exam.  4.  DM II:  Glucose well-controlled here but A1c 9.8.  ? Compliance @ home.  Will need close outpt f/u and med adjustment.  5.  Elevated troponin:  Mild, flat, troponin elevation to 0.07 on 6/7.  No chest pain though was dyspneic in setting of mild volume overload on admission.  Echo showed low-normal LV fxn (50-55%) with basal inferolateral HK.  Consider ischemic eval.  6.  NSVT:  Asymptomatic.  Up to 6 beats NSVT yesterday evening.  EF 50-55%.   blocker on hold in setting of #1.  7.  HL/HTG:  TG 522 in 11/2014.  LDL unable to be calculated @ that time.  Cont statin therapy, though may benefit from more aggressive treatment of TG.  Signed, Murray Hodgkins NP  EP attending  Patient seen and examined. I have reviewed the findings as noted by Mr. Sharolyn Douglas, NP-C and agree with his assessment. Exam reveals an iregular bradycardia with clear lungs and no peripheral edema. Neuro is non-focal. Despite being off of his beta blocker for several days, he  continues to have symptomatic 2:1 AV block. Will plan to proceed with DDD PM tomorrow. I would anticipating restarting a beta blocker (maybe coreg) after his PPM is placed.  Mikle Bosworth.D.

## 2015-08-29 ENCOUNTER — Encounter (HOSPITAL_COMMUNITY)
Admission: EM | Disposition: A | Discharge status: Home / Self Care | Payer: Self-pay | Source: Home / Self Care | Attending: Cardiology

## 2015-08-29 DIAGNOSIS — I441 Atrioventricular block, second degree: Secondary | ICD-10-CM

## 2015-08-29 HISTORY — PX: EP IMPLANTABLE DEVICE: SHX172B

## 2015-08-29 LAB — BASIC METABOLIC PANEL
Anion gap: 5 (ref 5–15)
BUN: 14 mg/dL (ref 6–20)
CO2: 25 mmol/L (ref 22–32)
CREATININE: 0.89 mg/dL (ref 0.61–1.24)
Calcium: 8.4 mg/dL — ABNORMAL LOW (ref 8.9–10.3)
Chloride: 104 mmol/L (ref 101–111)
Glucose, Bld: 197 mg/dL — ABNORMAL HIGH (ref 65–99)
POTASSIUM: 3.9 mmol/L (ref 3.5–5.1)
SODIUM: 134 mmol/L — AB (ref 135–145)

## 2015-08-29 LAB — CBC
HCT: 42.7 % (ref 39.0–52.0)
Hemoglobin: 13.8 g/dL (ref 13.0–17.0)
MCH: 27 pg (ref 26.0–34.0)
MCHC: 32.3 g/dL (ref 30.0–36.0)
MCV: 83.6 fL (ref 78.0–100.0)
Platelets: 192 10*3/uL (ref 150–400)
RBC: 5.11 MIL/uL (ref 4.22–5.81)
RDW: 14.4 % (ref 11.5–15.5)
WBC: 8.6 10*3/uL (ref 4.0–10.5)

## 2015-08-29 LAB — GLUCOSE, CAPILLARY
GLUCOSE-CAPILLARY: 127 mg/dL — AB (ref 65–99)
Glucose-Capillary: 156 mg/dL — ABNORMAL HIGH (ref 65–99)
Glucose-Capillary: 104 mg/dL — ABNORMAL HIGH (ref 65–99)
Glucose-Capillary: 110 mg/dL — ABNORMAL HIGH (ref 65–99)
Glucose-Capillary: 165 mg/dL — ABNORMAL HIGH (ref 65–99)

## 2015-08-29 LAB — SURGICAL PCR SCREEN
MRSA, PCR: NEGATIVE
STAPHYLOCOCCUS AUREUS: NEGATIVE

## 2015-08-29 LAB — MAGNESIUM: MAGNESIUM: 2.2 mg/dL (ref 1.7–2.4)

## 2015-08-29 SURGERY — PACEMAKER IMPLANT

## 2015-08-29 MED ORDER — CEFAZOLIN SODIUM-DEXTROSE 2-3 GM-% IV SOLR
INTRAVENOUS | Status: DC | PRN
  Administered 2015-08-29: 2 g via INTRAVENOUS

## 2015-08-29 MED ORDER — FENTANYL CITRATE (PF) 100 MCG/2ML IJ SOLN
INTRAMUSCULAR | Status: DC | PRN
  Administered 2015-08-29 (×2): 12.5 ug via INTRAVENOUS
  Administered 2015-08-29: 25 ug via INTRAVENOUS

## 2015-08-29 MED ORDER — MIDAZOLAM HCL 5 MG/5ML IJ SOLN
INTRAMUSCULAR | Status: DC | PRN
  Administered 2015-08-29 (×4): 1 mg via INTRAVENOUS

## 2015-08-29 MED ORDER — FENTANYL CITRATE (PF) 100 MCG/2ML IJ SOLN
INTRAMUSCULAR | Status: AC
Start: 2015-08-29 — End: 2015-08-29
  Filled 2015-08-29: qty 2

## 2015-08-29 MED ORDER — LIDOCAINE HCL (PF) 1 % IJ SOLN
INTRAMUSCULAR | Status: DC | PRN
  Administered 2015-08-29: 50 mL via INTRADERMAL

## 2015-08-29 MED ORDER — LIDOCAINE HCL (PF) 1 % IJ SOLN
INTRAMUSCULAR | Status: AC
  Filled 2015-08-29: qty 30

## 2015-08-29 MED ORDER — IOPAMIDOL (ISOVUE-370) INJECTION 76%
INTRAVENOUS | Status: AC
Start: 2015-08-29 — End: 2015-08-29
  Filled 2015-08-29: qty 50

## 2015-08-29 MED ORDER — CEFAZOLIN SODIUM-DEXTROSE 2-4 GM/100ML-% IV SOLN
INTRAVENOUS | Status: AC
  Filled 2015-08-29: qty 100

## 2015-08-29 MED ORDER — MIDAZOLAM HCL 5 MG/5ML IJ SOLN
INTRAMUSCULAR | Status: AC
Start: 2015-08-29 — End: 2015-08-29
  Filled 2015-08-29: qty 5

## 2015-08-29 MED ORDER — ACETAMINOPHEN 325 MG PO TABS: 325.0000 mg | ORAL_TABLET | ORAL | Status: DC | PRN

## 2015-08-29 MED ORDER — ONDANSETRON HCL 4 MG/2ML IJ SOLN: 4.0000 mg | Freq: Four times a day (QID) | INTRAMUSCULAR | Status: DC | PRN

## 2015-08-29 MED ORDER — IOPAMIDOL (ISOVUE-370) INJECTION 76%
INTRAVENOUS | Status: DC | PRN
  Administered 2015-08-29: 10 mL via INTRAVENOUS

## 2015-08-29 MED ORDER — HEPARIN (PORCINE) IN NACL 2-0.9 UNIT/ML-% IJ SOLN
INTRAMUSCULAR | Status: DC | PRN
  Administered 2015-08-29: 14:00:00

## 2015-08-29 MED ORDER — CEFAZOLIN SODIUM 1-5 GM-% IV SOLN
1.0000 g | Freq: Four times a day (QID) | INTRAVENOUS | Status: AC
  Administered 2015-08-29 – 2015-08-30 (×3): 1 g via INTRAVENOUS
  Filled 2015-08-29 (×3): qty 50

## 2015-08-29 MED ORDER — HEPARIN (PORCINE) IN NACL 2-0.9 UNIT/ML-% IJ SOLN
INTRAMUSCULAR | Status: AC
  Filled 2015-08-29: qty 500

## 2015-08-29 MED ORDER — SODIUM CHLORIDE 0.9 % IR SOLN
Status: AC
  Filled 2015-08-29: qty 2

## 2015-08-29 SURGICAL SUPPLY — 8 items
CABLE SURGICAL S-101-97-12 (CABLE) ×3 IMPLANT
LEAD CAPSURE NOVUS 5076-52CM (Lead) ×3 IMPLANT
LEAD CAPSURE NOVUS 5076-58CM (Lead) ×3 IMPLANT
PACEMAKER ADAPTA DR ADDRL1 (Pacemaker) ×1 IMPLANT
PAD DEFIB LIFELINK (PAD) ×3 IMPLANT
PPM ADAPTA DR ADDRL1 (Pacemaker) ×3 IMPLANT
SHEATH CLASSIC 7F (SHEATH) ×6 IMPLANT
TRAY PACEMAKER INSERTION (PACKS) ×3 IMPLANT

## 2015-08-29 NOTE — H&P (View-Only) (Signed)
SUBJECTIVE: The patient is doing well today.  At this time, he denies chest pain, shortness of breath, or any new concerns.  His only c/o this morning is being hungry.  . benazepril  40 mg Oral Daily  .  ceFAZolin (ANCEF) IV  2 g Intravenous To Cath  . enoxaparin (LOVENOX) injection  50 mg Subcutaneous Q24H  . famotidine  20 mg Oral BID  . gentamicin irrigation  80 mg Irrigation On Call  . guaiFENesin  600 mg Oral BID  . hydrochlorothiazide  25 mg Oral Daily  . insulin aspart  0-15 Units Subcutaneous TID WC  . insulin aspart  0-5 Units Subcutaneous QHS  . insulin aspart  4 Units Subcutaneous TID WC  . insulin glargine  45 Units Subcutaneous q1800  . multivitamin with minerals  1 tablet Oral Daily  . omega-3 acid ethyl esters  1 g Oral Daily  . rosuvastatin  20 mg Oral q1800  . sodium chloride flush  3 mL Intravenous Q12H  . sodium chloride flush  3 mL Intravenous Q12H  . tamsulosin  0.4 mg Oral QHS   . sodium chloride 50 mL/hr at 08/29/15 0606  . sodium chloride      OBJECTIVE: Physical Exam: Filed Vitals:   08/29/15 0600 08/29/15 0700 08/29/15 0800 08/29/15 0945  BP: 135/57  151/61 152/61  Pulse: 42 28 25 38  Temp:      TempSrc:      Resp: 11 15 15 17   Height:      Weight:      SpO2: 96% 98% 95% 99%    Intake/Output Summary (Last 24 hours) at 08/29/15 1055 Last data filed at 08/29/15 0700  Gross per 24 hour  Intake    760 ml  Output   1250 ml  Net   -490 ml    Telemetry reveals Sr with wenckebach, 2;1 and 3:1 conduction, rates generally 30's-40's, he had NSVT episode 15 beats  GEN- The patient is well appearing, alert and oriented x 3 today.   Head- normocephalic, atraumatic Eyes-  Sclera clear, conjunctiva pink Ears- hearing intact Oropharynx- clear Neck- supple, no JVP Lungs- Clear to ausculation bilaterally, normal work of breathing Heart- Regular rate and rhythm, no significant murmurs, no rubs or gallops GI- soft, NT, ND Extremities- no clubbing,  cyanosis, or edema Skin- no rash or lesion Psych- euthymic mood, full affect Neuro- no gross deficits appreciated  LABS: Basic Metabolic Panel:  Recent Labs  08/27/15 0753 08/29/15 0227  NA 140 134*  K 3.9 3.9  CL 107 104  CO2 26 25  GLUCOSE 86 197*  BUN 10 14  CREATININE 0.83 0.89  CALCIUM 8.8* 8.4*  MG 2.2 2.2  PHOS 4.2  --    CBC:  Recent Labs  08/29/15 0227  WBC 8.6  HGB 13.8  HCT 42.7  MCV 83.6  PLT 192     ASSESSMENT AND PLAN:  1. CHB >>> wenckeback 2:1 and 3:1 conduction off his BB  HR 30's-40's  For PPM implant today     Risks benefits discussed with him previously as wellaas by Dr. Lovena Le, patient remains agreeable to proceed  2. CHF  Likely secondary to bradycardia  S/p lasix one dose and symptoms resolved  Exam negative for fluid OL  3. HTN  Stable, follow will resume BB post pacer  4. DM     follow  5. NSVT     Asymptomatic     + WMA on his echo  basalinferolateral     No CP, flat Trop 0.05, 0.07 on admit [facility]     Consider ischemic w/u  6. HLD, hypertrigliceridemia     Obesity on Crestor     Counseled on diet/weight loss     Out patient f/u and management  Tommye Standard, PA-C 08/29/2015 10:55 AM   EP Attending  Patient seen and examined. Agree with above. Will plan to proceed with insertion of a DDD PM and restart his beta blocker. Would consider lexiscan myoview as an outpatient. I have discussed the risks/benefits/goals/expectations of PPM insertion and he wishes to proceed.  Mikle Bosworth.D.

## 2015-08-29 NOTE — Interval H&P Note (Signed)
History and Physical Interval Note:  08/29/2015 1:46 PM  John [name]  has presented today for surgery, with the diagnosis of hb  The various methods of treatment have been discussed with the patient and family. After consideration of risks, benefits and other options for treatment, the patient has consented to  Procedure(s): Pacemaker Implant (N/A) as a surgical intervention .  The patient's history has been reviewed, patient examined, no change in status, stable for surgery.  I have reviewed the patient's chart and labs.  Questions were answered to the patient's satisfaction.     John [name]

## 2015-08-29 NOTE — Care Management Important Message (Signed)
Important Message  Patient Details  Name: John [name] MRN: [id number] Date of Birth: [birth year]   Medicare Important Message Given:  Yes    Loann Quill 08/29/2015, 11:42 AM

## 2015-08-29 NOTE — Progress Notes (Signed)
SUBJECTIVE: The patient is doing well today.  At this time, he denies chest pain, shortness of breath, or any new concerns.  His only c/o this morning is being hungry.  . benazepril  40 mg Oral Daily  .  ceFAZolin (ANCEF) IV  2 g Intravenous To Cath  . enoxaparin (LOVENOX) injection  50 mg Subcutaneous Q24H  . famotidine  20 mg Oral BID  . gentamicin irrigation  80 mg Irrigation On Call  . guaiFENesin  600 mg Oral BID  . hydrochlorothiazide  25 mg Oral Daily  . insulin aspart  0-15 Units Subcutaneous TID WC  . insulin aspart  0-5 Units Subcutaneous QHS  . insulin aspart  4 Units Subcutaneous TID WC  . insulin glargine  45 Units Subcutaneous q1800  . multivitamin with minerals  1 tablet Oral Daily  . omega-3 acid ethyl esters  1 g Oral Daily  . rosuvastatin  20 mg Oral q1800  . sodium chloride flush  3 mL Intravenous Q12H  . sodium chloride flush  3 mL Intravenous Q12H  . tamsulosin  0.4 mg Oral QHS   . sodium chloride 50 mL/hr at 08/29/15 0606  . sodium chloride      OBJECTIVE: Physical Exam: Filed Vitals:   08/29/15 0600 08/29/15 0700 08/29/15 0800 08/29/15 0945  BP: 135/57  151/61 152/61  Pulse: 42 28 25 38  Temp:      TempSrc:      Resp: 11 15 15 17   Height:      Weight:      SpO2: 96% 98% 95% 99%    Intake/Output Summary (Last 24 hours) at 08/29/15 1055 Last data filed at 08/29/15 0700  Gross per 24 hour  Intake    760 ml  Output   1250 ml  Net   -490 ml    Telemetry reveals Sr with wenckebach, 2;1 and 3:1 conduction, rates generally 30's-40's, he had NSVT episode 15 beats  GEN- The patient is well appearing, alert and oriented x 3 today.   Head- normocephalic, atraumatic Eyes-  Sclera clear, conjunctiva pink Ears- hearing intact Oropharynx- clear Neck- supple, no JVP Lungs- Clear to ausculation bilaterally, normal work of breathing Heart- Regular rate and rhythm, no significant murmurs, no rubs or gallops GI- soft, NT, ND Extremities- no clubbing,  cyanosis, or edema Skin- no rash or lesion Psych- euthymic mood, full affect Neuro- no gross deficits appreciated  LABS: Basic Metabolic Panel:  Recent Labs  08/27/15 0753 08/29/15 0227  NA 140 134*  K 3.9 3.9  CL 107 104  CO2 26 25  GLUCOSE 86 197*  BUN 10 14  CREATININE 0.83 0.89  CALCIUM 8.8* 8.4*  MG 2.2 2.2  PHOS 4.2  --    CBC:  Recent Labs  08/29/15 0227  WBC 8.6  HGB 13.8  HCT 42.7  MCV 83.6  PLT 192     ASSESSMENT AND PLAN:  1. CHB >>> wenckeback 2:1 and 3:1 conduction off his BB  HR 30's-40's  For PPM implant today     Risks benefits discussed with him previously as wellaas by Dr. Lovena Le, patient remains agreeable to proceed  2. CHF  Likely secondary to bradycardia  S/p lasix one dose and symptoms resolved  Exam negative for fluid OL  3. HTN  Stable, follow will resume BB post pacer  4. DM     follow  5. NSVT     Asymptomatic     + WMA on his echo  basalinferolateral     No CP, flat Trop 0.05, 0.07 on admit to [facility]     Consider ischemic w/u  6. HLD, hypertrigliceridemia     Obesity on Crestor     Counseled on diet/weight loss     Out patient f/u and management  Tommye Standard, PA-C 08/29/2015 10:55 AM   EP Attending  Patient seen and examined. Agree with above. Will plan to proceed with insertion of a DDD PM and restart his beta blocker. Would consider lexiscan myoview as an outpatient. I have discussed the risks/benefits/goals/expectations of PPM insertion and he wishes to proceed.  Mikle Bosworth.D.

## 2015-08-29 NOTE — Discharge Instructions (Signed)
° ° °  Supplemental Discharge Instructions for  Pacemaker/Defibrillator Patients  Activity No heavy lifting or vigorous activity with your left/right arm for 6 to 8 weeks.  Do not raise your left/right arm above your head for one week.  Gradually raise your affected arm as drawn below.              09/02/15                       09/03/15                   09/04/15                   09/05/15 __  NO DRIVING for 1 week    ; you may begin driving on  4/68/03  .  WOUND CARE - Keep the wound area clean and dry.  Do not get this area wet for one week. No showers for one week; you may shower on 09/05/15   . - The tape/steri-strips on your wound will fall off; do not pull them off.  No bandage is needed on the site.  DO  NOT apply any creams, oils, or ointments to the wound area. - If you notice any drainage or discharge from the wound, any swelling or bruising at the site, or you develop a fever > 101? F after you are discharged home, call the office at once.  Special Instructions - You are still able to use cellular telephones; use the ear opposite the side where you have your pacemaker/defibrillator.  Avoid carrying your cellular phone near your device. - When traveling through airports, show security personnel your identification card to avoid being screened in the metal detectors.  Ask the security personnel to use the hand wand. - Avoid arc welding equipment, MRI testing (magnetic resonance imaging), TENS units (transcutaneous nerve stimulators).  Call the office for questions about other devices. - Avoid electrical appliances that are in poor condition or are not properly grounded. - Microwave ovens are safe to be near or to operate.  Additional information for defibrillator patients should your device go off: - If your device goes off ONCE and you feel fine afterward, notify the device clinic nurses. - If your device goes off ONCE and you do not feel well afterward, call 911. - If your device goes  off TWICE, call 911. - If your device goes off THREE times in one day, call 911.  DO NOT DRIVE YOURSELF OR A FAMILY MEMBER WITH A DEFIBRILLATOR TO THE HOSPITAL--CALL 911.

## 2015-08-29 NOTE — Discharge Summary (Signed)
DISCHARGE SUMMARY    Patient ID: John [name],  MRN: [id number], DOB/AGE: [birth year] 70 y.o.  Admit date: 08/25/2015 Discharge date: 08/30/15  Primary Care Physician: Mickie Hillier, MD Primary Cardiologist: (new) Dr. Jacinta Shoe Electrophysiologist: (new) Dr. Lovena Le  Primary Discharge Diagnosis:  1. CHF 2. Symptomatic bradycardia status post pacemaker implantation this admission  Secondary Discharge Diagnosis:  1. HTN 2. HLD 3. DM 4. PAFlutter remotely s/p ablation, no longer on a/c  No Known Allergies   Procedures This Admission:  1.  Implantation of a MDT dual chamber PPM on 08/29/15 by Dr Lovena Le.  The patient received a Medtronic (serial number [id number]) pacemaker, and Medtronic [id number] (serial number [id number]) right atrial lead and a Medtronic [id number] (serial number [id number]) right ventricular lead. There were no immediate post procedure complications. 2.  CXR on 08/30/15 AM demonstrated a small pneumothorax status post device implantation.  3.  f/u CXR after O2 08/30/15 noon, no pneumothorax, no pulmonary edema  Brief HPI: John [name] is a 70 y.o. male was admitted to [facility] with symptoms of orthopnea/PND, diuresed for CHF, noting high degree heart block despite holding his BB was [facility] for further care and management.  Hospital Course:  The patient was admitted then to [facility] ICU for close evaluation and management, he was seen by EP service.  He had been observed at [facility] to have episodes of CHB with rates in 30's as well as 2:1 conduction and AVWB.  He had an echo done noting EF 50-55%, with +WMA basalinferolateral wall.  His Toprol XL was held upon his admission to [facility], upon arrival to [facility] he had not quite completed a full 5 1/2 lives of his Toprol, as the day progressed he was observed to have regained SR with only AVWB with rates 50's and decided to observe longer prior to PPM.  He was observed and felt ready for discharge with SR, though then did  develop again more bradycardic rates into the 30's with 2:1 conduction and decided to pursue PPM implant.  The patient remained euvolemic by his exam and symptoms, and underwent implantation of a PPM with details as outlined above. He was monitored on telemetry overnight which demonstrated SR, V paced.  Left chest was without hematoma or ecchymosis.  The device was interrogated and found to be functioning normally.  CXR was obtained and demonstrated a small pneumothorax, he was placed on 100% oxygen therapy and had a f/u CXR done at noon today with no pneumothorax and no pulmonary edema.  Wound care, arm mobility, and restrictions were reviewed with the patient.  We started coreg post PPM for BP control, and given WMA and some NSVT on his telemetry, he has been arranged for lexiscan stress testing out patient.  The patient was examined by Dr. Lovena Le and considered stable for discharge to home.    Physical Exam: Filed Vitals:   08/30/15 0728 08/30/15 0932 08/30/15 1103 08/30/15 1245  BP: 172/82  156/77   Pulse: 58 67    Temp: 98.2 F (36.8 C)  97.6 F (36.4 C)   TempSrc: Oral  Oral   Resp: 20     Height:      Weight:      SpO2: 95%   96%    GEN- The patient is well appearing, alert and oriented x 3 today.   HEENT: normocephalic, atraumatic; sclera clear, conjunctiva pink; hearing intact; oropharynx clear; neck supple, no JVP Lungs- Clear to ausculation bilaterally, normal  work of breathing.  No wheezes, rales, rhonchi, no absent breath sounds Heart- Regular rate and rhythm, no murmurs, rubs or gallops, PMI not laterally displaced GI- soft, non-tender, non-distended, bowel sounds present, no hepatosplenomegaly Extremities- no clubbing, cyanosis, or edema MS- no significant deformity or atrophy Skin- warm and dry, no rash or lesion, left chest without hematoma/ecchymosis Psych- euthymic mood, full affect Neuro- no gross deficits   Labs:   Lab Results  Component Value Date   WBC 8.6  08/29/2015   HGB 13.8 08/29/2015   HCT 42.7 08/29/2015   MCV 83.6 08/29/2015   PLT 192 08/29/2015    Recent Labs Lab 08/25/15 1037  08/29/15 0227  NA 139  < > 134*  K 3.6  < > 3.9  CL 110  < > 104  CO2 23  < > 25  BUN 15  < > 14  CREATININE 0.84  < > 0.89  CALCIUM 8.7*  < > 8.4*  PROT 6.9  --   --   BILITOT 1.1  --   --   ALKPHOS 58  --   --   ALT 33  --   --   AST 24  --   --   GLUCOSE 209*  < > 197*  < > = values in this interval not displayed.  Discharge Medications:    Medication List    STOP taking these medications        metoprolol succinate 50 MG 24 hr tablet  Commonly known as:  TOPROL-XL      TAKE these medications        amLODipine 10 MG tablet  Commonly known as:  NORVASC  Take 1 tablet (10 mg total) by mouth daily.     benazepril 40 MG tablet  Commonly known as:  LOTENSIN  Take 1 tablet (40 mg total) by mouth daily.     carvedilol 25 MG tablet  Commonly known as:  COREG  Take 1 tablet (25 mg total) by mouth 2 (two) times daily with a meal.     Fish Oil 1000 MG Caps  Take 1 capsule by mouth daily.     hydrochlorothiazide 25 MG tablet  Commonly known as:  HYDRODIURIL  Take 1 tablet (25 mg total) by mouth daily.     Magnesium 400 MG Caps  Take by mouth.     multivitamin with minerals tablet  Take 1 tablet by mouth daily.     Resveratrol 100 MG Caps  Take 1 capsule by mouth daily.     rosuvastatin 20 MG tablet  Commonly known as:  CRESTOR  Take 1 tablet (20 mg total) by mouth daily.     sitaGLIPtin-metformin 50-1000 MG tablet  Commonly known as:  JANUMET  Take 1 tablet by mouth 2 (two) times daily with a meal.     tadalafil 20 MG tablet  Commonly known as:  CIALIS  Take 1 tablet (20 mg total) by mouth daily as needed. For erectile dysfunction     tamsulosin 0.4 MG Caps capsule  Commonly known as:  FLOMAX  Take 1 capsule (0.4 mg total) by mouth at bedtime.     TOUJEO SOLOSTAR 300 UNIT/ML Sopn  Generic drug:  Insulin Glargine    Inject 45 Units into the skin daily.        Disposition:   Home Discharge Instructions    Diet - low sodium heart healthy    Complete by:  As directed      Increase  activity slowly    Complete by:  As directed           Follow-up Information    Follow up with [facility] On 09/07/2015.   Specialty:  Cardiology   Why:  9:30AM, wound check   Contact information:   [address], [place] [phone]      Follow up with [name], MD On 11/25/2015.   Specialty:  Cardiology   Why:  8:45AM   Contact information:   [place] [address] [phone]       Follow up with [facility] On 09/08/2015.   Why:  8:45AM stress test.  arrive to radiology registration.  If you need to change the test date/time, please call Dr. Tanna Furry office at the number listed for the [facility] office   Contact information:   [address] [phone]      Duration of Discharge Encounter: Greater than 30 minutes including physician time.  Signed, Tommye Standard, PA-C 08/30/2015 1:20 PM  EP Attending  Patient seen and examined. Agree with above. Stable for DC home.  Mikle Bosworth.D.

## 2015-08-29 NOTE — Progress Notes (Signed)
Episode of short SVT/non sustained vtach around 0118 asymptomatic-Dr Eula Fried notified w/ order for BMET/MG level this am.

## 2015-08-30 ENCOUNTER — Inpatient Hospital Stay (HOSPITAL_COMMUNITY): Payer: Commercial Managed Care - HMO

## 2015-08-30 ENCOUNTER — Other Ambulatory Visit: Payer: Self-pay | Admitting: Physician Assistant

## 2015-08-30 ENCOUNTER — Encounter (HOSPITAL_COMMUNITY): Payer: Self-pay | Admitting: Internal Medicine

## 2015-08-30 DIAGNOSIS — I4729 Other ventricular tachycardia: Secondary | ICD-10-CM

## 2015-08-30 DIAGNOSIS — I472 Ventricular tachycardia: Secondary | ICD-10-CM

## 2015-08-30 LAB — GLUCOSE, CAPILLARY
Glucose-Capillary: 97 mg/dL (ref 65–99)
Glucose-Capillary: 137 mg/dL — ABNORMAL HIGH (ref 65–99)

## 2015-08-30 MED ORDER — LIVING BETTER WITH HEART FAILURE BOOK
Freq: Once | Status: AC
  Administered 2015-08-30: 14:00:00

## 2015-08-30 MED ORDER — YOU HAVE A PACEMAKER BOOK
Freq: Once | Status: AC
  Administered 2015-08-30: 07:00:00
  Filled 2015-08-30: qty 1

## 2015-08-30 MED ORDER — CARVEDILOL 25 MG PO TABS: 25.0000 mg | ORAL_TABLET | Freq: Two times a day (BID) | ORAL | Status: DC

## 2015-08-30 MED ORDER — CARVEDILOL 12.5 MG PO TABS
25.0000 mg | ORAL_TABLET | Freq: Two times a day (BID) | ORAL | Status: DC
  Administered 2015-08-30: 10:00:00 25 mg via ORAL
  Filled 2015-08-30 (×2): qty 2

## 2015-08-30 NOTE — Progress Notes (Signed)
100% oxygen applied on patient per [place] [facility] PA order. Patient was 96% on room air and is 100% on nonrebreather at this time. No shortness of breath noted. Lung sounds present in all lobes. No s/s of distress noted or complaints voiced at this time. Call bell is in reach and bed is in lowest position. Steri-strips noted to left upper chest remains in place c/d/i. No edema noted.

## 2015-08-30 NOTE — Progress Notes (Signed)
Radiology called with report that John [name] has a 5% left apical pneumothorax. Notified Tommye Standard PA.

## 2015-08-30 NOTE — Progress Notes (Signed)
[name] PA states not to apply 100% oxygen back on patient at this time. Patient's oxygen sats are 97% on room air no signs of shortness of breath noted. Unlabored breathing noted. No s/s of distress noted or complaints voiced. Patient has been ambulating in room. Family at bedside.

## 2015-08-30 NOTE — Progress Notes (Signed)
SUBJECTIVE: The patient is doing well today.  At this time, he denies chest pain, shortness of breath, or any new concerns. Wants to go home  . benazepril  40 mg Oral Daily  . carvedilol  25 mg Oral BID WC  . enoxaparin (LOVENOX) injection  50 mg Subcutaneous Q24H  . famotidine  20 mg Oral BID  . guaiFENesin  600 mg Oral BID  . hydrochlorothiazide  25 mg Oral Daily  . insulin aspart  0-15 Units Subcutaneous TID WC  . insulin aspart  0-5 Units Subcutaneous QHS  . insulin aspart  4 Units Subcutaneous TID WC  . insulin glargine  45 Units Subcutaneous q1800  . multivitamin with minerals  1 tablet Oral Daily  . omega-3 acid ethyl esters  1 g Oral Daily  . rosuvastatin  20 mg Oral q1800  . sodium chloride flush  3 mL Intravenous Q12H  . tamsulosin  0.4 mg Oral QHS      OBJECTIVE: Physical Exam: Filed Vitals:   08/29/15 1700 08/29/15 1944 08/30/15 0440 08/30/15 0728  BP: 158/75 179/68 164/81 172/82  Pulse: 56 70 63 58  Temp:  98.8 F (37.1 C) 97.8 F (36.6 C) 98.2 F (36.8 C)  TempSrc:  Oral Oral Oral  Resp: 14 19 14    Height:      Weight:   242 lb 1 oz (109.8 kg)   SpO2: 96% 96% 98% 95%    Intake/Output Summary (Last 24 hours) at 08/30/15 0839 Last data filed at 08/30/15 0829  Gross per 24 hour  Intake    970 ml  Output   2200 ml  Net  -1230 ml    Telemetry reveals sinus rhythm, V pacing  GEN- The patient is well appearing, alert and oriented x 3 today.   Head- normocephalic, atraumatic Eyes-  Sclera clear, conjunctiva pink Ears- hearing intact Oropharynx- clear Neck- supple, no JVP Lungs- Clear to ausculation bilaterally, normal work of breathing, no absent breath sounds are appreciated Heart- Regular rate and rhythm, no significant murmurs, no rubs or gallops GI- soft, NT, ND Extremities- no clubbing, cyanosis, or edema Skin- no rash or lesion Psych- euthymic mood, full affect Neuro- no gross deficits appreciated  LABS: Basic Metabolic Panel:  Recent  Labs  08/29/15 0227  NA 134*  K 3.9  CL 104  CO2 25  GLUCOSE 197*  BUN 14  CREATININE 0.89  CALCIUM 8.4*  MG 2.2   CBC:  Recent Labs  08/29/15 0227  WBC 8.6  HGB 13.8  HCT 42.7  MCV 83.6  PLT 192    RADIOLOGY: Dg Chest 2 View 08/30/2015  CLINICAL DATA:  Status post permanent pacemaker placement EXAM: CHEST  2 VIEW COMPARISON:  Portable chest x-ray of August 25, 2015 FINDINGS: There is a less than 5% left apical pneumothorax. Otherwise the lungs are well-expanded. Coarse interstitial markings in the right perihilar region are present but are stable. The heart is top-normal in size. The pulmonary vascularity is not engorged. The permanent pacemaker electrodes are in reasonable position with the generator projecting in the upper left pectoral region. The bony thorax is unremarkable. IMPRESSION: Less than 5% left apical pneumothorax. There is no pleural effusion. Chronic interstitial prominence in the right perihilar region. Otherwise no acute cardiopulmonary abnormality. The permanent pacemaker is in reasonable position. These results will be called to the ordering clinician or representative by the Radiologist Assistant, and communication documented in the PACS or zVision Dashboard. Electronically Signed   By: Shanon Brow  [place] M.D.   On: 08/30/2015 07:38     ASSESSMENT AND PLAN:  1. CHB >>> wenckeback 2:1 and 3:1 conduction off his BB  HR 30's-40's  s/p PPM yesterday     Device function is intact     CXR as above with small pneumothorax     O2 sats 90's on RA, pt without symptoms, exam as above, no absent breath sounds appreciated Will give 100% oxygen via VM and repeat CXR this afternoon  2. CHF  Likely secondary to bradycardia  S/p lasix one dose and symptoms resolved  Exam remains negative for fluid OL  3. HTN  start coreg  4. DM  follow  5. NSVT     None further  Asymptomatic  + WMA on his echo basalinferolateral  No CP, flat Trop 0.05,  0.07 on [facility]  out patient f/u with stress testing  6. HLD, hypertrigliceridemia  Obesity on Crestor  Counseled on diet/weight loss  Out patient f/u and management  Tommye Standard, PA-C 08/30/2015 8:39 AM   EP Attending  Patient seen and examined. No complaints today. Denies chest pain or sob. He has a small apical PTX. His device is working normally. Will plan to give supplemental oxygen and recheck CXR. If PTX is not significantly enlarged, will plan to DC Home. I expect he will resolve this on his own. Usual followup.  Mikle Bosworth.D.

## 2015-09-07 ENCOUNTER — Encounter: Payer: Self-pay | Admitting: Internal Medicine

## 2015-09-07 ENCOUNTER — Ambulatory Visit (INDEPENDENT_AMBULATORY_CARE_PROVIDER_SITE_OTHER): Payer: Commercial Managed Care - HMO | Admitting: *Deleted

## 2015-09-07 DIAGNOSIS — Z950 Presence of cardiac pacemaker: Secondary | ICD-10-CM | POA: Diagnosis not present

## 2015-09-07 LAB — CUP PACEART INCLINIC DEVICE CHECK
Battery Impedance: 100 Ohm
Battery Voltage: 2.79 V
Brady Statistic AP VP Percent: 59 %
Brady Statistic AP VS Percent: 2 %
Brady Statistic AS VS Percent: 0 %
Implantable Lead Implant Date: 20170605
Implantable Lead Location: 753860
Implantable Lead Model: 5076
Lead Channel Impedance Value: 532 Ohm
Lead Channel Pacing Threshold Amplitude: 0.75 V
Lead Channel Pacing Threshold Amplitude: 0.875 V
Lead Channel Pacing Threshold Pulse Width: 0.4 ms
Lead Channel Pacing Threshold Pulse Width: 0.4 ms
Lead Channel Setting Pacing Amplitude: 3.5 V
Lead Channel Setting Pacing Pulse Width: 0.4 ms
Lead Channel Setting Sensing Sensitivity: 2.8 mV
MDC IDC LEAD IMPLANT DT: 20170605
MDC IDC LEAD LOCATION: 753859
MDC IDC MSMT BATTERY REMAINING LONGEVITY: 119 mo
MDC IDC MSMT LEADCHNL RA IMPEDANCE VALUE: 532 Ohm
MDC IDC MSMT LEADCHNL RA PACING THRESHOLD AMPLITUDE: 1 V
MDC IDC MSMT LEADCHNL RA PACING THRESHOLD PULSEWIDTH: 0.4 ms
MDC IDC MSMT LEADCHNL RV PACING THRESHOLD AMPLITUDE: 0.875 V
MDC IDC MSMT LEADCHNL RV PACING THRESHOLD PULSEWIDTH: 0.4 ms
MDC IDC MSMT LEADCHNL RV SENSING INTR AMPL: 11.2 mV
MDC IDC SESS DTM: 20170614095311
MDC IDC SET LEADCHNL RV PACING AMPLITUDE: 3.5 V
MDC IDC STAT BRADY AS VP PERCENT: 40 %

## 2015-09-07 NOTE — Progress Notes (Signed)
Wound check appointment. Steri-strips removed. Wound without redness or edema. Incision edges approximated, wound well healed. Normal device function. Thresholds, sensing, and impedances consistent with implant measurements. Device programmed at 3.5V/auto capture programmed on for extra safety margin until 3 month visit. Histogram distribution appropriate for patient and level of activity. 1 mode switches less than 30sec. No high ventricular rates noted. Patient educated about wound care, arm mobility, lifting restrictions. ROV with GT 11/25/2015.

## 2015-09-08 ENCOUNTER — Encounter (HOSPITAL_COMMUNITY): Admission: RE | Admit: 2015-09-08 | Payer: Commercial Managed Care - HMO | Source: Ambulatory Visit

## 2015-09-08 ENCOUNTER — Encounter (HOSPITAL_COMMUNITY)
Admission: RE | Admit: 2015-09-08 | Discharge: 2015-09-08 | Disposition: A | Discharge status: Home / Self Care | Payer: Commercial Managed Care - HMO | Source: Ambulatory Visit | Attending: Physician Assistant | Admitting: Physician Assistant

## 2015-09-08 ENCOUNTER — Telehealth: Payer: Self-pay | Admitting: Internal Medicine

## 2015-09-08 ENCOUNTER — Inpatient Hospital Stay (HOSPITAL_COMMUNITY): Admit: 2015-09-08 | Payer: Commercial Managed Care - HMO

## 2015-09-08 DIAGNOSIS — I472 Ventricular tachycardia: Secondary | ICD-10-CM

## 2015-09-08 DIAGNOSIS — I4729 Other ventricular tachycardia: Secondary | ICD-10-CM

## 2015-09-08 NOTE — Telephone Encounter (Signed)
Patient would like to know if he can take medications for test ordered

## 2015-09-08 NOTE — Telephone Encounter (Signed)
Returned pt's call asked him HOLD Janumet night before and morning of lexiscan as he is diabetic and must be NPO for study,also HOLD am dose of Insulin.He was discharged from hospital and test was set up then,he did not receive instructions and was turned away today as he had already eaten, apt was rescheduled

## 2015-09-12 ENCOUNTER — Ambulatory Visit (INDEPENDENT_AMBULATORY_CARE_PROVIDER_SITE_OTHER): Payer: Commercial Managed Care - HMO | Admitting: Internal Medicine

## 2015-09-12 ENCOUNTER — Encounter: Payer: Self-pay | Admitting: Internal Medicine

## 2015-09-12 VITALS — BP 126/60 | HR 51 | Ht 71.0 in | Wt 252.0 lb

## 2015-09-12 DIAGNOSIS — I441 Atrioventricular block, second degree: Secondary | ICD-10-CM

## 2015-09-12 DIAGNOSIS — I4892 Unspecified atrial flutter: Secondary | ICD-10-CM

## 2015-09-12 LAB — CUP PACEART INCLINIC DEVICE CHECK
Battery Impedance: 100 Ohm
Battery Remaining Longevity: 114 mo
Brady Statistic AP VP Percent: 96.4 %
Brady Statistic AP VS Percent: 2.8 %
Brady Statistic AS VS Percent: 0.1 % — CL
Date Time Interrogation Session: 20170619101723
Implantable Lead Implant Date: 20170605
Implantable Lead Location: 753859
Implantable Lead Model: 5076
Lead Channel Impedance Value: 512 Ohm
Lead Channel Impedance Value: 541 Ohm
Lead Channel Pacing Threshold Pulse Width: 1 ms
Lead Channel Setting Pacing Amplitude: 3.5 V
MDC IDC LEAD IMPLANT DT: 20170605
MDC IDC LEAD LOCATION: 753860
MDC IDC MSMT BATTERY VOLTAGE: 2.79 V
MDC IDC MSMT LEADCHNL RA PACING THRESHOLD AMPLITUDE: 2 V
MDC IDC MSMT LEADCHNL RV PACING THRESHOLD AMPLITUDE: 1 V
MDC IDC MSMT LEADCHNL RV PACING THRESHOLD PULSEWIDTH: 0.4 ms
MDC IDC MSMT LEADCHNL RV SENSING INTR AMPL: 15.68 mV
MDC IDC SET LEADCHNL RA PACING AMPLITUDE: 3.5 V
MDC IDC SET LEADCHNL RV PACING PULSEWIDTH: 0.4 ms
MDC IDC SET LEADCHNL RV SENSING SENSITIVITY: 2.8 mV
MDC IDC STAT BRADY AS VP PERCENT: 0.8 %

## 2015-09-12 NOTE — Patient Instructions (Signed)
Your physician recommends that you schedule a follow-up appointment in: 2 months with Dr. Lovena Le.  Your physician recommends that you continue on your current medications as directed. Please refer to the Current Medication list given to you today.  If you need a refill on your cardiac medications before your next appointment, please call your pharmacy.  Thank you for choosing [facility]!

## 2015-09-12 NOTE — Progress Notes (Signed)
HPI John [name] returns today for followup. He is a pleasant 70 yo man who was admitted with CHF and atrial flutter remotely who underwent catheter ablation years ago. He was admitted just over 2 weeks ago and was found to have symptomatic 2:1 AV block and underwent ppm insertion which was complicated by a tiny PTX. He has been stable since his PPM except he has had some swelling around his left arm. No sob or chest pain. No edema.  No Known Allergies   Current Outpatient Prescriptions  Medication Sig Dispense Refill  . amLODipine (NORVASC) 10 MG tablet Take 1 tablet (10 mg total) by mouth daily. 90 tablet 1  . benazepril (LOTENSIN) 40 MG tablet Take 1 tablet (40 mg total) by mouth daily. 90 tablet 1  . carvedilol (COREG) 25 MG tablet Take 1 tablet (25 mg total) by mouth 2 (two) times daily with a meal. 60 tablet 3  . hydrochlorothiazide (HYDRODIURIL) 25 MG tablet Take 1 tablet (25 mg total) by mouth daily. 90 tablet 1  . Insulin Glargine (TOUJEO SOLOSTAR) 300 UNIT/ML SOPN Inject 45 Units into the skin daily.    . Magnesium 400 MG CAPS Take by mouth.    . Multiple Vitamins-Minerals (MULTIVITAMIN WITH MINERALS) tablet Take 1 tablet by mouth daily.    . Omega-3 Fatty Acids (FISH OIL) 1000 MG CAPS Take 1 capsule by mouth daily.    Marland Kitchen Resveratrol 100 MG CAPS Take 1 capsule by mouth daily.    . rosuvastatin (CRESTOR) 20 MG tablet Take 1 tablet (20 mg total) by mouth daily. 90 tablet 1  . sitaGLIPtan-metformin (JANUMET) 50-1000 MG per tablet Take 1 tablet by mouth 2 (two) times daily with a meal.      . tadalafil (CIALIS) 20 MG tablet Take 1 tablet (20 mg total) by mouth daily as needed. For erectile dysfunction 10 tablet 6  . tamsulosin (FLOMAX) 0.4 MG CAPS capsule Take 1 capsule (0.4 mg total) by mouth at bedtime. 90 capsule 1  . [DISCONTINUED] hydrALAZINE (APRESOLINE) 25 MG tablet Take 1 tablet (25 mg total) by mouth 3 (three) times daily. 270 tablet 3   No current facility-administered medications  for this visit.     Past Medical History  Diagnosis Date  . Osteoarthritis   . Diabetes mellitus     non insulin dependant  . Hypertension   . Hyperlipidemia   . Chronic combined systolic and diastolic heart failure (HCC)     echo 01/06/11: mild LVH, EF 65-70%, mod to severe LAE, mild RVE, mild RAE, PASP 32;   TEE 10/12: EF 45-50%   . Atrial flutter ([place]) 12/2010    Admitted with symptomatic bradycardia (HR 40s), atrial flutter with slow ventricular response 12/2010 + volume overload; AV nodal agents d/c'd and Pradaxa started; RFA in 01/2011  . PSVT (paroxysmal supraventricular tachycardia) (HCC)     Possibly atrial flutter    ROS:   All systems reviewed and negative except as noted in the HPI.   Past Surgical History  Procedure Laterality Date  . Knee arthroscopy  2005    left  . Lumbar spine surgery      "I've had 6 ORs 1972 thru 2004"  . Cardiac electrophysiology study and ablation  02/07/11  . Atrial flutter ablation N/A 02/07/2011    Procedure: ATRIAL FLUTTER ABLATION;  Surgeon: Evans Lance, MD;  [facility] C[facility] LAB;  Service: Cardiovascular;  Laterality: N/A;  . Ep implantable device N/A 08/29/2015    Procedure: Pacemaker Implant;  Surgeon: Evans Lance, MD;  Location: [place] CV LAB;  Service: Cardiovascular;  Laterality: N/A;     Family History  Problem Relation Age of Onset  . Heart attack Mother   . Hypertension Mother   . Heart attack Father   . Hypertension Father   . Heart attack Brother      Social History   Social History  . Marital Status: Married    Spouse Name: N/A  . Number of Children: N/A  . Years of Education: N/A   Occupational History  . Designer, industrial/product    Social History Main Topics  . Smoking status: Former Smoker -- 1.00 packs/day for 10 years    Types: Cigarettes    Quit date: 03/26/1986  . Smokeless tobacco: Never Used     Comment: "stopped cigarette  smoking 1988"  . Alcohol Use: 0.0 oz/week    0 Standard  drinks or equivalent per week     Comment: "quit alcohol ~ 2007"  . Drug Use: No  . Sexual Activity: Not on file   Other Topics Concern  . Not on file   Social History Narrative     BP 126/60 mmHg  Pulse 51  Ht 5' 11"  (1.803 m)  Wt 252 lb (114.306 kg)  BMI 35.16 kg/m2  SpO2 98%  Physical Exam:  Well appearing 70 yo man, NAD HEENT: Unremarkable Neck:  6 cm JVD, no thyromegally Lymphatics:  No adenopathy Back:  No CVA tenderness Lungs:  Clear with no wheezes, rales, or rhonchi. HEART:  Regular rate rhythm, no murmurs, no rubs, no clicks Abd:  soft, positive bowel sounds, no organomegally, no rebound, no guarding Ext:  2 plus pulses, no cyanosis, no clubbing, left arm with minimal swelling Skin:  No rashes no nodules Neuro:  CN II through XII intact, motor grossly intact  PM interogation - normal DDD PM function although difficult to no about the atrial threshold for certain but appears increased a bit.  Assess/Plan:  1. PPM - His device is functioning satisfactorily. Will recheck thresholds on followup. Atrial output increased today. Will hold off on CXR for now. 2. Left arm swelling - he will keep it elevated. Will hold off on ultrasound for now. If his arm swelling does not improve, then we  would consider left upper extremity ultrasound 3. HTN - his blood pressure is well controlled. Will follow.   John [name].D.

## 2015-09-14 DIAGNOSIS — E119 Type 2 diabetes mellitus without complications: Secondary | ICD-10-CM | POA: Diagnosis not present

## 2015-09-14 DIAGNOSIS — E7800 Pure hypercholesterolemia, unspecified: Secondary | ICD-10-CM | POA: Diagnosis not present

## 2015-09-14 DIAGNOSIS — I10 Essential (primary) hypertension: Secondary | ICD-10-CM | POA: Diagnosis not present

## 2015-09-14 DIAGNOSIS — I509 Heart failure, unspecified: Secondary | ICD-10-CM | POA: Diagnosis not present

## 2015-09-19 ENCOUNTER — Encounter (HOSPITAL_COMMUNITY)
Admission: RE | Admit: 2015-09-19 | Discharge: 2015-09-19 | Disposition: A | Discharge status: Home / Self Care | Payer: Commercial Managed Care - HMO | Source: Ambulatory Visit | Attending: Physician Assistant | Admitting: Physician Assistant

## 2015-09-19 ENCOUNTER — Encounter (HOSPITAL_COMMUNITY): Payer: Self-pay

## 2015-09-19 ENCOUNTER — Inpatient Hospital Stay (HOSPITAL_COMMUNITY)
Admission: RE | Admit: 2015-09-19 | Discharge: 2015-09-19 | Disposition: A | Discharge status: Home / Self Care | Payer: Commercial Managed Care - HMO | Source: Ambulatory Visit

## 2015-09-19 DIAGNOSIS — I472 Ventricular tachycardia: Secondary | ICD-10-CM | POA: Insufficient documentation

## 2015-09-19 LAB — NM MYOCAR MULTI W/SPECT W/WALL MOTION / EF
CHL CUP NUCLEAR SDS: 2
CHL CUP RESTING HR STRESS: 90 {beats}/min
LV sys vol: 123 mL
LVDIAVOL: 152 mL (ref 62–150)
NUC STRESS TID: 1.07
Peak HR: 96 {beats}/min
RATE: 0.41
SRS: 5
SSS: 7

## 2015-09-19 MED ORDER — TECHNETIUM TC 99M TETROFOSMIN IV KIT
30.0000 | PACK | Freq: Once | INTRAVENOUS | Status: AC | PRN
  Administered 2015-09-19: 27.8 via INTRAVENOUS

## 2015-09-19 MED ORDER — TECHNETIUM TC 99M TETROFOSMIN IV KIT
10.0000 | PACK | Freq: Once | INTRAVENOUS | Status: AC | PRN
  Administered 2015-09-19: 9.2 via INTRAVENOUS

## 2015-09-19 MED ORDER — REGADENOSON 0.4 MG/5ML IV SOLN
INTRAVENOUS | Status: AC
  Administered 2015-09-19: 0.4 mg via INTRAVENOUS
  Filled 2015-09-19: qty 5

## 2015-09-19 MED ORDER — SODIUM CHLORIDE 0.9% FLUSH
INTRAVENOUS | Status: AC
  Administered 2015-09-19: 10 mL via INTRAVENOUS
  Filled 2015-09-19: qty 10

## 2015-09-20 ENCOUNTER — Telehealth: Payer: Self-pay | Admitting: Adult Health

## 2015-09-20 MED ORDER — CARVEDILOL 25 MG PO TABS: 25.0000 mg | ORAL_TABLET | Freq: Two times a day (BID) | ORAL | Status: DC

## 2015-09-20 NOTE — Telephone Encounter (Signed)
Needs refill on Carvedilol sent to [facility] / 90 day supply / tg

## 2015-09-21 ENCOUNTER — Telehealth: Payer: Self-pay | Admitting: Physician Assistant

## 2015-09-21 NOTE — Telephone Encounter (Signed)
Called to discuss stress test result, no answer, answering machine picked up but stopped and unable to leave message. Will attempt again tomorrow.  Tommye Standard, PA-C

## 2015-09-23 ENCOUNTER — Telehealth: Payer: Self-pay | Admitting: Physician Assistant

## 2015-09-23 NOTE — Telephone Encounter (Signed)
Called to discuss stress test result, left a message to call back on Monday to discuss the result.  Tommye Standard, PA-C

## 2015-09-28 ENCOUNTER — Telehealth: Payer: Self-pay | Admitting: Physician Assistant

## 2015-09-28 ENCOUNTER — Telehealth: Payer: Self-pay | Admitting: Adult Health

## 2015-09-28 NOTE — Telephone Encounter (Signed)
Results of stress test / tg

## 2015-09-28 NOTE — Telephone Encounter (Signed)
Discussed stress test results, no ischemia, EF appears  Reduced by nuclear study, though echo done only weeks prior was normal.  I discussed with Dr. Lovena Le, Dr. Acie Fredrickson was present, echo is felt to be the better test for assessment of LV and the stress test likely underestimates more with paced rhythm.  The patient is feeling well, no CP, or symptoms.  Suggest given fixed defect and risk factors he take ECASA 34m daily, he reports that he already is.  He will keep his planned f/u, sooner if needed.  RTommye Standard PA-C

## 2015-09-28 NOTE — Telephone Encounter (Signed)
Patient notified that Baldwin [place], PA-C would like to discuss the results of his stress test with him. Patient states he will be home all morning, this will be a great time to call him at home.

## 2015-10-04 ENCOUNTER — Other Ambulatory Visit: Payer: Self-pay | Admitting: Family Medicine

## 2015-10-04 ENCOUNTER — Other Ambulatory Visit: Payer: Self-pay

## 2015-10-04 ENCOUNTER — Other Ambulatory Visit: Payer: Self-pay | Admitting: *Deleted

## 2015-10-04 NOTE — Patient Outreach (Signed)
[facility]) Care Management  10/04/2015  DEAUNDRA KUTZER 09/26/1945 [id number]   Referral from [facility] Management; patient had recent hospital stay with 2nd degree heart block /pacemaker placement. Was hospitalized from 6/1-08/30/2015.   Telephone call to patient who was advised of reason for call & of [facility] care management services.  Patient gave HIPPA verification with name, date of birth and home address.   Patient voices that he was recently hospitalized & had placement of pacemaker.  States he has had follow up with heart specialist-Dr. Lovena Le 09/12/2015.  Voices appointment with primary care provider scheduled for September. Advised patient of importance of seeing primary care provider after hospital discharge on 08/30/15.  Patient voices understanding. Patient states he also sees endocrinologist for diabetes. States he has taken outpatient diabetes classes at [facility] and is able to manage his condition. States he is checking blood sugar 1-3 times daily depending on what blood sugar is in the morning. States target is 145. Voices he knows how to treat low blood sugar level. States he is also monitoring weight daily and knows when he has fluid build up & when to call MD or 911.  Patient voices that he manages his own medications and is taking as instructed. States no problem getting prescriptions filled.  States he walks a little bit every day. Patient voices that he has support from spouse as needed but that he is functioning independently.  Recommendations for [facility] care management services for disease management for chronic health condition.   Patient declines services and voices that he is managing his chronic conditions and does not need case management services -community or telephonic at this time. Patient consents to [facility] brochure & contact information.   Plan: Send [facility] contact information to patient. Send MD closure letter. Send to care management assistant to  close case.      Sherrin Daisy, RN BSN [facility] Management Coordinator [facility] Care Management  [phone]

## 2015-10-04 NOTE — Patient Outreach (Addendum)
[facility]) Care Management  10/04/2015  John [name] 10-17-1945 [id number]   Telephonic Screening   Referral Date:  09/28/14 Source:  NTSP [place] Issue: PMH of CHF; EF 50-55%, HTN, HLD, DM, PA flutter.  Recently in-patient for 2nd degree heart block; S/P Pacemaker, CHF.  Recently completed a stress test and is awaiting results.  He would benefit from ongoing health coach with education and support for his chronic health conditions.  Insurance:  [employer].  Outreach call #1 to patient.  Contact verified that correct name of contact is Mikell Kazlauskas / not Steele Sizer; states patient is not available for a call.  States best time to reach patient is in the morning hours.  RN CM left HIPAA compliant message with name and number for call back.  RN CM spoke with assigned CM:  Sherrin Daisy RN CM; verifies patient reached on 10/03/15; assessed and patient refused services.   Vaughan Basta will close case to [facility] services.  No further follow-up needed. RN CM notified [facility], [facility] of Epic Chart correction needed regarding name.    Nathaneil Canary, BSN, RN, CCM  [facility] [employer] [employer] [phone] Direct [phone] Cell [phone] Office

## 2015-10-05 ENCOUNTER — Encounter: Payer: Self-pay | Admitting: *Deleted

## 2015-10-24 ENCOUNTER — Encounter: Payer: Self-pay | Admitting: Family Medicine

## 2015-10-24 ENCOUNTER — Ambulatory Visit (INDEPENDENT_AMBULATORY_CARE_PROVIDER_SITE_OTHER): Payer: Commercial Managed Care - HMO | Admitting: Family Medicine

## 2015-10-24 VITALS — BP 132/68 | Ht 71.5 in | Wt 254.0 lb

## 2015-10-24 DIAGNOSIS — I4892 Unspecified atrial flutter: Secondary | ICD-10-CM | POA: Diagnosis not present

## 2015-10-24 DIAGNOSIS — I10 Essential (primary) hypertension: Secondary | ICD-10-CM | POA: Diagnosis not present

## 2015-10-24 DIAGNOSIS — E785 Hyperlipidemia, unspecified: Secondary | ICD-10-CM

## 2015-10-24 DIAGNOSIS — Z79899 Other long term (current) drug therapy: Secondary | ICD-10-CM

## 2015-10-24 MED ORDER — HYDROCHLOROTHIAZIDE 25 MG PO TABS: 25.0000 mg | ORAL_TABLET | Freq: Every day | ORAL | 1 refills | Status: DC

## 2015-10-24 MED ORDER — TAMSULOSIN HCL 0.4 MG PO CAPS: 0.4000 mg | ORAL_CAPSULE | Freq: Every day | ORAL | 1 refills | Status: DC

## 2015-10-24 MED ORDER — ROSUVASTATIN CALCIUM 20 MG PO TABS: 20.0000 mg | ORAL_TABLET | Freq: Every day | ORAL | 1 refills | Status: DC

## 2015-10-24 MED ORDER — BENAZEPRIL HCL 40 MG PO TABS: 40.0000 mg | ORAL_TABLET | Freq: Every day | ORAL | 1 refills | Status: DC

## 2015-10-24 MED ORDER — AMLODIPINE BESYLATE 10 MG PO TABS: 10.0000 mg | ORAL_TABLET | Freq: Every day | ORAL | 1 refills | Status: DC

## 2015-10-24 NOTE — Progress Notes (Signed)
   Subjective:    Patient ID: John [name], male    DOB: February 28, 1946, 70 y.o.   MRN: [id number] Patient arrives office with numerous concerns. HPIfollow up on pacemaker. Pt had pacemaker put in last month. No problems or issues. Pt has follow up with cardiology September 1st.   Sees specialist for diabetes.   130 or 140 over 70  Blood pressure medicine and blood pressure levels reviewed today with patient. Compliant with blood pressure medicine. States does not miss a dose. No obvious side effects. Blood pressure generally good when checked elsewhere. Watching salt intake.   Sometimes lower  Not above 1`35  Feeling ok as far as lower dose of crestor   flomax has helped a lot  13 0 or so in trhe morning   Dr Loletta Specter q three months,  A1 c due soon    States overall urinating has improved considerably on Flomax. No obvious side effects. Medication use reviewed.  Patient continues to take lipid medication regularly. No obvious side effects from it. Generally does not miss a dose. Prior blood work results are reviewed with patient. Patient continues to work on fat intake in diet Cholesterol level since new dose of Crestor has not been yet obtained Review of Systems No headache, no major weight loss or weight gain, no chest pain no back pain abdominal pain no change in bowel habits complete ROS otherwise negative     Objective:   Physical Exam  Alert vitals stable, NAD. Blood pressure good on repeat. HEENT normal. Lungs clear. Heart regular rate and rhythm. Rhythm appears to be within normal limits at this time. Ankles trace edema      Assessment & Plan:  Impression 1 hypertension good control discussed reviewed to maintain same #2 hyperlipidemia status uncertain old blood work reviewed to maintain same pending blood work #3 prostate hypertrophy clinically improved #4 atrial flutter significant discussion held the were not managing. Overall patient please with the negative  stress test and response to the medicines. Also handling new pacer well

## 2015-10-26 DIAGNOSIS — Z79899 Other long term (current) drug therapy: Secondary | ICD-10-CM | POA: Diagnosis not present

## 2015-10-26 DIAGNOSIS — E785 Hyperlipidemia, unspecified: Secondary | ICD-10-CM | POA: Diagnosis not present

## 2015-10-27 LAB — LIPID PANEL
CHOLESTEROL TOTAL: 132 mg/dL (ref 100–199)
Chol/HDL Ratio: 1.8 ratio units (ref 0.0–5.0)
HDL: 72 mg/dL (ref >39)
LDL CALC: 45 mg/dL (ref 0–99)
TRIGLYCERIDES: 76 mg/dL (ref 0–149)
VLDL Cholesterol Cal: 15 mg/dL (ref 5–40)

## 2015-10-27 LAB — HEPATIC FUNCTION PANEL
ALK PHOS: 55 IU/L (ref 39–117)
ALT: 18 IU/L (ref 0–44)
AST: 16 IU/L (ref 0–40)
Albumin: 4 g/dL (ref 3.6–4.8)
BILIRUBIN TOTAL: 1.4 mg/dL — AB (ref 0.0–1.2)
BILIRUBIN, DIRECT: 0.38 mg/dL (ref 0.00–0.40)
Total Protein: 7 g/dL (ref 6.0–8.5)

## 2015-10-28 ENCOUNTER — Encounter: Payer: Self-pay | Admitting: Family Medicine

## 2015-11-17 ENCOUNTER — Telehealth: Payer: Self-pay | Admitting: Internal Medicine

## 2015-11-17 NOTE — Telephone Encounter (Signed)
Patient states that he has been having "hacking cough" some SOB and has been having to sleep on extra pillow. / tg

## 2015-11-20 ENCOUNTER — Emergency Department (HOSPITAL_COMMUNITY): Payer: Commercial Managed Care - HMO

## 2015-11-20 ENCOUNTER — Inpatient Hospital Stay (HOSPITAL_COMMUNITY)
Admission: EM | Admit: 2015-11-20 | Discharge: 2015-11-23 | DRG: 292 | Disposition: A | Discharge status: Home / Self Care | Payer: Commercial Managed Care - HMO | Attending: Family Medicine | Admitting: Family Medicine

## 2015-11-20 ENCOUNTER — Encounter (HOSPITAL_COMMUNITY): Payer: Self-pay | Admitting: Emergency Medicine

## 2015-11-20 DIAGNOSIS — I442 Atrioventricular block, complete: Secondary | ICD-10-CM | POA: Diagnosis present

## 2015-11-20 DIAGNOSIS — E1159 Type 2 diabetes mellitus with other circulatory complications: Secondary | ICD-10-CM | POA: Diagnosis not present

## 2015-11-20 DIAGNOSIS — E785 Hyperlipidemia, unspecified: Secondary | ICD-10-CM | POA: Diagnosis not present

## 2015-11-20 DIAGNOSIS — E782 Mixed hyperlipidemia: Secondary | ICD-10-CM | POA: Diagnosis present

## 2015-11-20 DIAGNOSIS — Z87891 Personal history of nicotine dependence: Secondary | ICD-10-CM | POA: Diagnosis not present

## 2015-11-20 DIAGNOSIS — I248 Other forms of acute ischemic heart disease: Secondary | ICD-10-CM | POA: Diagnosis present

## 2015-11-20 DIAGNOSIS — Z950 Presence of cardiac pacemaker: Secondary | ICD-10-CM

## 2015-11-20 DIAGNOSIS — I110 Hypertensive heart disease with heart failure: Principal | ICD-10-CM | POA: Diagnosis present

## 2015-11-20 DIAGNOSIS — I5031 Acute diastolic (congestive) heart failure: Secondary | ICD-10-CM | POA: Diagnosis not present

## 2015-11-20 DIAGNOSIS — I5043 Acute on chronic combined systolic (congestive) and diastolic (congestive) heart failure: Secondary | ICD-10-CM | POA: Diagnosis not present

## 2015-11-20 DIAGNOSIS — R0600 Dyspnea, unspecified: Secondary | ICD-10-CM | POA: Diagnosis not present

## 2015-11-20 DIAGNOSIS — R7989 Other specified abnormal findings of blood chemistry: Secondary | ICD-10-CM

## 2015-11-20 DIAGNOSIS — Z794 Long term (current) use of insulin: Secondary | ICD-10-CM | POA: Diagnosis not present

## 2015-11-20 DIAGNOSIS — R778 Other specified abnormalities of plasma proteins: Secondary | ICD-10-CM | POA: Diagnosis present

## 2015-11-20 DIAGNOSIS — R0602 Shortness of breath: Secondary | ICD-10-CM | POA: Diagnosis not present

## 2015-11-20 DIAGNOSIS — I10 Essential (primary) hypertension: Secondary | ICD-10-CM

## 2015-11-20 DIAGNOSIS — I509 Heart failure, unspecified: Secondary | ICD-10-CM | POA: Diagnosis not present

## 2015-11-20 DIAGNOSIS — Z8249 Family history of ischemic heart disease and other diseases of the circulatory system: Secondary | ICD-10-CM

## 2015-11-20 DIAGNOSIS — I5041 Acute combined systolic (congestive) and diastolic (congestive) heart failure: Secondary | ICD-10-CM | POA: Diagnosis not present

## 2015-11-20 HISTORY — DX: Heart failure, unspecified: I50.9

## 2015-11-20 LAB — COMPREHENSIVE METABOLIC PANEL
ALBUMIN: 4.1 g/dL (ref 3.5–5.0)
ALK PHOS: 56 U/L (ref 38–126)
ALT: 21 U/L (ref 17–63)
ANION GAP: 8 (ref 5–15)
AST: 22 U/L (ref 15–41)
BUN: 18 mg/dL (ref 6–20)
CALCIUM: 9.3 mg/dL (ref 8.9–10.3)
CHLORIDE: 110 mmol/L (ref 101–111)
CO2: 23 mmol/L (ref 22–32)
Creatinine, Ser: 1.03 mg/dL (ref 0.61–1.24)
GFR calc non Af Amer: >60 mL/min (ref >60)
GLUCOSE: 143 mg/dL — AB (ref 65–99)
Potassium: 3.9 mmol/L (ref 3.5–5.1)
SODIUM: 141 mmol/L (ref 135–145)
Total Bilirubin: 1.3 mg/dL — ABNORMAL HIGH (ref 0.3–1.2)
Total Protein: 7.5 g/dL (ref 6.5–8.1)

## 2015-11-20 LAB — CBC WITH DIFFERENTIAL/PLATELET
BASOS ABS: 0 10*3/uL (ref 0.0–0.1)
BASOS PCT: 1 %
EOS ABS: 0.2 10*3/uL (ref 0.0–0.7)
Eosinophils Relative: 2 %
HEMATOCRIT: 39.8 % (ref 39.0–52.0)
HEMOGLOBIN: 13.2 g/dL (ref 13.0–17.0)
Lymphocytes Relative: 31 %
Lymphs Abs: 2.7 10*3/uL (ref 0.7–4.0)
MCH: 27.4 pg (ref 26.0–34.0)
MCHC: 33.2 g/dL (ref 30.0–36.0)
MCV: 82.6 fL (ref 78.0–100.0)
MONO ABS: 0.7 10*3/uL (ref 0.1–1.0)
Monocytes Relative: 8 %
NEUTROS ABS: 5 10*3/uL (ref 1.7–7.7)
NEUTROS PCT: 58 %
Platelets: 134 10*3/uL — ABNORMAL LOW (ref 150–400)
RBC: 4.82 MIL/uL (ref 4.22–5.81)
RDW: 16.9 % — AB (ref 11.5–15.5)
WBC: 8.7 10*3/uL (ref 4.0–10.5)

## 2015-11-20 LAB — GLUCOSE, CAPILLARY: GLUCOSE-CAPILLARY: 119 mg/dL — AB (ref 65–99)

## 2015-11-20 LAB — BRAIN NATRIURETIC PEPTIDE: B NATRIURETIC PEPTIDE 5: 841 pg/mL — AB (ref 0.0–100.0)

## 2015-11-20 LAB — TROPONIN I: Troponin I: 0.03 ng/mL (ref <0.03)

## 2015-11-20 MED ORDER — HYDRALAZINE HCL 20 MG/ML IJ SOLN: 10.0000 mg | INTRAMUSCULAR | Status: DC | PRN

## 2015-11-20 MED ORDER — INSULIN GLARGINE 100 UNIT/ML ~~LOC~~ SOLN
38.0000 [IU] | Freq: Every day | SUBCUTANEOUS | Status: DC
  Filled 2015-11-20: qty 0.38

## 2015-11-20 MED ORDER — INSULIN GLARGINE 100 UNIT/ML ~~LOC~~ SOLN
38.0000 [IU] | Freq: Every day | SUBCUTANEOUS | Status: DC
  Administered 2015-11-21 – 2015-11-23 (×3): 38 [IU] via SUBCUTANEOUS
  Filled 2015-11-20 (×5): qty 0.38

## 2015-11-20 MED ORDER — INSULIN ASPART 100 UNIT/ML ~~LOC~~ SOLN
0.0000 [IU] | Freq: Three times a day (TID) | SUBCUTANEOUS | Status: DC
  Administered 2015-11-21 – 2015-11-23 (×4): 3 [IU] via SUBCUTANEOUS
  Administered 2015-11-23: 5 [IU] via SUBCUTANEOUS

## 2015-11-20 MED ORDER — FUROSEMIDE 10 MG/ML IJ SOLN
40.0000 mg | Freq: Once | INTRAMUSCULAR | Status: AC
  Administered 2015-11-20: 40 mg via INTRAVENOUS
  Filled 2015-11-20: qty 4

## 2015-11-20 MED ORDER — ADULT MULTIVITAMIN W/MINERALS CH
1.0000 | ORAL_TABLET | Freq: Every day | ORAL | Status: DC
  Administered 2015-11-21 – 2015-11-23 (×3): 1 via ORAL
  Filled 2015-11-20 (×5): qty 1

## 2015-11-20 MED ORDER — ENOXAPARIN SODIUM 40 MG/0.4ML ~~LOC~~ SOLN
40.0000 mg | SUBCUTANEOUS | Status: DC
  Administered 2015-11-20 – 2015-11-22 (×3): 40 mg via SUBCUTANEOUS
  Filled 2015-11-20 (×3): qty 0.4

## 2015-11-20 MED ORDER — POTASSIUM CHLORIDE CRYS ER 10 MEQ PO TBCR
10.0000 meq | EXTENDED_RELEASE_TABLET | Freq: Two times a day (BID) | ORAL | Status: DC
  Administered 2015-11-20 – 2015-11-23 (×7): 10 meq via ORAL
  Filled 2015-11-20 (×6): qty 1

## 2015-11-20 MED ORDER — ROSUVASTATIN CALCIUM 20 MG PO TABS
20.0000 mg | ORAL_TABLET | Freq: Every day | ORAL | Status: DC
  Administered 2015-11-21 – 2015-11-23 (×3): 20 mg via ORAL
  Filled 2015-11-20 (×3): qty 1

## 2015-11-20 MED ORDER — CARVEDILOL 12.5 MG PO TABS
25.0000 mg | ORAL_TABLET | Freq: Two times a day (BID) | ORAL | Status: DC
  Administered 2015-11-21 – 2015-11-23 (×4): 25 mg via ORAL
  Filled 2015-11-20 (×5): qty 2

## 2015-11-20 MED ORDER — SODIUM CHLORIDE 0.9% FLUSH
3.0000 mL | Freq: Two times a day (BID) | INTRAVENOUS | Status: DC
  Administered 2015-11-20 – 2015-11-23 (×6): 3 mL via INTRAVENOUS

## 2015-11-20 MED ORDER — TAMSULOSIN HCL 0.4 MG PO CAPS
0.4000 mg | ORAL_CAPSULE | Freq: Every day | ORAL | Status: DC
  Administered 2015-11-20 – 2015-11-22 (×3): 0.4 mg via ORAL
  Filled 2015-11-20 (×3): qty 1

## 2015-11-20 MED ORDER — ACETAMINOPHEN 325 MG PO TABS: 650.0000 mg | ORAL_TABLET | ORAL | Status: DC | PRN

## 2015-11-20 MED ORDER — BENAZEPRIL HCL 10 MG PO TABS
40.0000 mg | ORAL_TABLET | Freq: Every day | ORAL | Status: DC
  Administered 2015-11-21 – 2015-11-23 (×3): 40 mg via ORAL
  Filled 2015-11-20 (×3): qty 4

## 2015-11-20 MED ORDER — AMLODIPINE BESYLATE 5 MG PO TABS
10.0000 mg | ORAL_TABLET | Freq: Every day | ORAL | Status: DC
  Administered 2015-11-21 – 2015-11-23 (×3): 10 mg via ORAL
  Filled 2015-11-20 (×3): qty 2

## 2015-11-20 MED ORDER — SODIUM CHLORIDE 0.9% FLUSH: 3.0000 mL | INTRAVENOUS | Status: DC | PRN

## 2015-11-20 MED ORDER — OMEGA-3-ACID ETHYL ESTERS 1 G PO CAPS
1.0000 g | ORAL_CAPSULE | Freq: Every day | ORAL | Status: DC
  Administered 2015-11-21 – 2015-11-23 (×3): 1 g via ORAL
  Filled 2015-11-20 (×3): qty 1

## 2015-11-20 MED ORDER — FUROSEMIDE 10 MG/ML IJ SOLN
40.0000 mg | Freq: Two times a day (BID) | INTRAMUSCULAR | Status: DC
  Administered 2015-11-21 – 2015-11-23 (×5): 40 mg via INTRAVENOUS
  Filled 2015-11-20 (×5): qty 4

## 2015-11-20 MED ORDER — ONDANSETRON HCL 4 MG/2ML IJ SOLN: 4.0000 mg | Freq: Four times a day (QID) | INTRAMUSCULAR | Status: DC | PRN

## 2015-11-20 MED ORDER — HYDROCHLOROTHIAZIDE 25 MG PO TABS
25.0000 mg | ORAL_TABLET | Freq: Every day | ORAL | Status: DC
  Administered 2015-11-21 – 2015-11-23 (×3): 25 mg via ORAL
  Filled 2015-11-20 (×3): qty 1

## 2015-11-20 MED ORDER — SODIUM CHLORIDE 0.9 % IV SOLN: 250.0000 mL | INTRAVENOUS | Status: DC | PRN

## 2015-11-20 NOTE — ED Notes (Signed)
CRITICAL VALUE ALERT  Critical value received:  Troponin 0.03 Date of notification:  11/19/15  Time of notification: 1622  Critical value read back: yes Nurse who received alert:  Ilda Mori  MD notified (1st page): [email or web address]  Time of first page:  1623  MD notified (2nd page):  Time of second page:  Responding MD:  [email or web address]  Time MD responded:  [phone]

## 2015-11-20 NOTE — ED Triage Notes (Signed)
Reports shortness of breath beginning last night with inability to lay flat and increased swelling in feet and legs.

## 2015-11-20 NOTE — ED Provider Notes (Signed)
[facility] DEPT Provider Note   CSN: [id number] Arrival date & time: 11/20/15  1504     History   Chief Complaint Chief Complaint  Patient presents with  . Shortness of Breath  . Leg Swelling    HPI John [name] is a 70 y.o. male.  HPI  Pt was seen at 1520. Per pt, c/o gradual onset and worsening of persistent SOB for the past 1 week, worse over the past 2 days. Pt states his SOB is worse when he lays flat, and he has woken up from sleep the past 2 nights due to feeling SOB. States he has been walking shorter distances than usual due to feeling SOB. Has been associated with increasing pedal edema. Denies CP/palpitations, no cough, no back pain, no abd pain, no N/V/D, no fevers, no rash.   Past Medical History:  Diagnosis Date  . Atrial flutter ([place]) 12/2010   Admitted with symptomatic bradycardia (HR 40s), atrial flutter with slow ventricular response 12/2010 + volume overload; AV nodal agents d/c'd and Pradaxa started; RFA in 01/2011  . Chronic combined systolic and diastolic heart failure (HCC)    echo 01/06/11: mild LVH, EF 65-70%, mod to severe LAE, mild RVE, mild RAE, PASP 32;   TEE 10/12: EF 45-50%   . Diabetes mellitus    non insulin dependant  . Hyperlipidemia   . Hypertension   . Osteoarthritis   . PSVT (paroxysmal supraventricular tachycardia) (HCC)    Possibly atrial flutter    Patient Active Problem List   Diagnosis Date Noted  . Chronic diastolic CHF (congestive heart failure) ([place]) 08/28/2015  . Hypertensive heart disease 08/28/2015  . NSVT (nonsustained ventricular tachycardia) ([place]) 08/28/2015  . Elevated troponin 08/28/2015  . Second degree heart block 08/25/2015  . Bradycardia 08/25/2015  . Acute CHF (congestive heart failure) ([place]) 08/25/2015  . Chronic anticoagulation-discontinued 05/04/2011  . Atrial flutter ([place]) 01/05/2011  . DM type 2 causing vascular disease ([place]) 12/23/2009  . Hyperlipidemia LDL goal <100 12/23/2009  . HYPERTENSION  12/23/2009  . DEGENERATIVE JOINT DISEASE 12/23/2009    Past Surgical History:  Procedure Laterality Date  . ATRIAL FLUTTER ABLATION N/A 02/07/2011   Procedure: ATRIAL FLUTTER ABLATION;  Surgeon: Evans Lance, MD;  Location: [facility] CATH LAB;  Service: Cardiovascular;  Laterality: N/A;  . CARDIAC ELECTROPHYSIOLOGY STUDY AND ABLATION  02/07/11  . EP IMPLANTABLE DEVICE N/A 08/29/2015   Procedure: Pacemaker Implant;  Surgeon: Evans Lance, MD;  Location: [facility] CV LAB;  Service: Cardiovascular;  Laterality: N/A;  . KNEE ARTHROSCOPY  2005   left  . LUMBAR SPINE SURGERY     "I've had 6 ORs 1972 thru 2004"       Home Medications    Prior to Admission medications   Medication Sig Start Date End Date Taking? Authorizing Provider  amLODipine (NORVASC) 10 MG tablet Take 1 tablet (10 mg total) by mouth daily. 10/24/15   Mikey Kirschner, MD  benazepril (LOTENSIN) 40 MG tablet Take 1 tablet (40 mg total) by mouth daily. 10/24/15   Mikey Kirschner, MD  carvedilol (COREG) 25 MG tablet Take 1 tablet (25 mg total) by mouth 2 (two) times daily with a meal. 09/20/15   Lendon Colonel, NP  hydrochlorothiazide (HYDRODIURIL) 25 MG tablet Take 1 tablet (25 mg total) by mouth daily. 10/24/15   Mikey Kirschner, MD  Insulin Glargine (TOUJEO SOLOSTAR) 300 UNIT/ML SOPN Inject 45 Units into the skin daily.    Historical Provider, MD  Magnesium 400 MG CAPS Take by mouth.    Historical Provider, MD  Multiple Vitamins-Minerals (MULTIVITAMIN WITH MINERALS) tablet Take 1 tablet by mouth daily.    Historical Provider, MD  Omega-3 Fatty Acids (FISH OIL) 1000 MG CAPS Take 1 capsule by mouth daily.    Historical Provider, MD  Resveratrol 100 MG CAPS Take 1 capsule by mouth daily.    Historical Provider, MD  rosuvastatin (CRESTOR) 20 MG tablet Take 1 tablet (20 mg total) by mouth daily. 10/24/15   Mikey Kirschner, MD  sitaGLIPtan-metformin (JANUMET) 50-1000 MG per tablet Take 1 tablet by mouth 2 (two) times daily with  a meal.      Historical Provider, MD  tadalafil (CIALIS) 20 MG tablet Take 1 tablet (20 mg total) by mouth daily as needed. For erectile dysfunction 04/17/12   Yehuda [place], MD  tamsulosin (FLOMAX) 0.4 MG CAPS capsule Take 1 capsule (0.4 mg total) by mouth at bedtime. 10/24/15   Mikey Kirschner, MD    Family History Family History  Problem Relation Age of Onset  . Heart attack Mother   . Hypertension Mother   . Heart attack Father   . Hypertension Father   . Heart attack Brother     Social History Social History  Substance Use Topics  . Smoking status: Former Smoker    Packs/day: 1.00    Years: 10.00    Types: Cigarettes    Quit date: 03/26/1986  . Smokeless tobacco: Never Used     Comment: "stopped cigarette  smoking 1988"  . Alcohol use 0.0 oz/week     Comment: "quit alcohol ~ 2007"     Allergies   Review of patient's allergies indicates no known allergies.   Review of Systems Review of Systems ROS: Statement: All systems negative except as marked or noted in the HPI; Constitutional: Negative for fever and chills. ; ; Eyes: Negative for eye pain, redness and discharge. ; ; ENMT: Negative for ear pain, hoarseness, nasal congestion, sinus pressure and sore throat. ; ; Cardiovascular: Negative for chest pain, palpitations, diaphoresis. +dyspnea and peripheral edema. ; ; Respiratory: Negative for cough, wheezing and stridor. ; ; Gastrointestinal: Negative for nausea, vomiting, diarrhea, abdominal pain, blood in stool, hematemesis, jaundice and rectal bleeding. . ; ; Genitourinary: Negative for dysuria, flank pain and hematuria. ; ; Musculoskeletal: Negative for back pain and neck pain. Negative for swelling and trauma.; ; Skin: Negative for pruritus, rash, abrasions, blisters, bruising and skin lesion.; ; Neuro: Negative for headache, lightheadedness and neck stiffness. Negative for weakness, altered level of consciousness, altered mental status, extremity weakness, paresthesias,  involuntary movement, seizure and syncope.       Physical Exam Updated Vital Signs BP (!) 145/123 (BP Location: Left Arm)   Pulse 72   Temp 98.2 F (36.8 C) (Oral)   Resp 18   Ht 5' 11"  (1.803 m)   Wt 245 lb (111.1 kg)   SpO2 97%   BMI 34.17 kg/m   Physical Exam 1525: Physical examination:  Nursing notes reviewed; Vital signs and O2 SAT reviewed;  Constitutional: Well developed, Well nourished, Well hydrated, In no acute distress; Head:  Normocephalic, atraumatic; Eyes: EOMI, PERRL, No scleral icterus; ENMT: Mouth and pharynx normal, Mucous membranes moist; Neck: Supple, Full range of motion, No lymphadenopathy; Cardiovascular: Regular rate and rhythm, No gallop; Respiratory: Breath sounds clear & equal bilaterally, No wheezes.  Speaking full sentences with ease, Normal respiratory effort/excursion; Chest: Nontender, Movement normal; Abdomen: Soft, Nontender, Nondistended, Normal  bowel sounds; Genitourinary: No CVA tenderness; Extremities: Pulses normal, No tenderness, +2 pedal edema bilat. No calf asymmetry.; Neuro: AA&Ox3, Major CN grossly intact.  Speech clear. No gross focal motor or sensory deficits in extremities.; Skin: Color normal, Warm, Dry.   ED Treatments / Results  Labs (all labs ordered are listed, but only abnormal results are displayed)   EKG  EKG Interpretation  Date/Time:    Ventricular Rate:  71 PR Interval:    QRS Duration: 167 QT Interval:  463 QTC Calculation: 504 R Axis:   -87 Text Interpretation:  Atrial-ventricular dual-paced complexes No further analysis attempted due to paced rhythm When compared with ECG of 08/30/2015 No significant change was found Confirmed by [facility]  MD, Nunzio Cory [phone]) on 11/20/2015 3:43:59 PM       Radiology   Procedures Procedures (including critical care time)  Medications Ordered in ED Medications - No data to display   Initial Impression / Assessment and Plan / ED Course  I have reviewed the triage vital signs  and the nursing notes.  Pertinent labs & imaging results that were available during my care of the patient were reviewed by me and considered in my medical decision making (see chart for details).  MDM Reviewed: previous chart, nursing note and vitals Reviewed previous: labs and ECG Interpretation: labs, ECG and x-ray    Results for orders placed or performed during the hospital encounter of 11/20/15  CBC with Differential  Result Value Ref Range   WBC 8.7 4.0 - 10.5 K/uL   RBC 4.82 4.22 - 5.81 MIL/uL   Hemoglobin 13.2 13.0 - 17.0 g/dL   HCT 39.8 39.0 - 52.0 %   MCV 82.6 78.0 - 100.0 fL   MCH 27.4 26.0 - 34.0 pg   MCHC 33.2 30.0 - 36.0 g/dL   RDW 16.9 (H) 11.5 - 15.5 %   Platelets 134 (L) 150 - 400 K/uL   Neutrophils Relative % 58 %   Neutro Abs 5.0 1.7 - 7.7 K/uL   Lymphocytes Relative 31 %   Lymphs Abs 2.7 0.7 - 4.0 K/uL   Monocytes Relative 8 %   Monocytes Absolute 0.7 0.1 - 1.0 K/uL   Eosinophils Relative 2 %   Eosinophils Absolute 0.2 0.0 - 0.7 K/uL   Basophils Relative 1 %   Basophils Absolute 0.0 0.0 - 0.1 K/uL  Brain natriuretic peptide  Result Value Ref Range   B Natriuretic Peptide 841.0 (H) 0.0 - 100.0 pg/mL  Comprehensive metabolic panel  Result Value Ref Range   Sodium 141 135 - 145 mmol/L   Potassium 3.9 3.5 - 5.1 mmol/L   Chloride 110 101 - 111 mmol/L   CO2 23 22 - 32 mmol/L   Glucose, Bld 143 (H) 65 - 99 mg/dL   BUN 18 6 - 20 mg/dL   Creatinine, Ser 1.03 0.61 - 1.24 mg/dL   Calcium 9.3 8.9 - 10.3 mg/dL   Total Protein 7.5 6.5 - 8.1 g/dL   Albumin 4.1 3.5 - 5.0 g/dL   AST 22 15 - 41 U/L   ALT 21 17 - 63 U/L   Alkaline Phosphatase 56 38 - 126 U/L   Total Bilirubin 1.3 (H) 0.3 - 1.2 mg/dL   GFR calc non Af Amer >60 >60 mL/min   GFR calc Af Amer >60 >60 mL/min   Anion gap 8 5 - 15  Troponin I  Result Value Ref Range   Troponin I 0.03 (HH) <0.03 ng/mL   Dg Chest 2  View Result Date: 11/20/2015 CLINICAL DATA:  Shortness of breath. EXAM: CHEST  2  VIEW COMPARISON:  Chest radiograph 08/30/2015 FINDINGS: Dual lead cardiac pacemaker is seen. The cardiac silhouette is enlarged. Mediastinal contours appear intact. There is no evidence of or pneumothorax. There is bilateral peribronchial thickening with central predominance. Reticular opacities in the mid to lower a right lung are stable. Osseous structures are without acute abnormality. Soft tissues are grossly normal. IMPRESSION: Bilateral peribronchial thickening with central predominance. This may be seen with pulmonary arterial hypertension, pulmonary vascular congestion or peribronchial infiltrative processes. Enlargement of the cardiac silhouette. Stable reticular opacities in the right mid lung. Electronically Signed   By: Fidela Salisbury M.D.   On: 11/20/2015 16:12    1650:  BNP elevated from previous; IV lasix ordered. Troponin mildly elevated, pt denies CP. Dx and testing d/w pt and family.  Questions answered.  Verb understanding, agreeable to admit.  T/C to [facility] Dr. Myna Hidalgo, case discussed, including:  HPI, pertinent PM/SHx, VS/PE, dx testing, ED course and treatment:  Agreeable to admit, requests to write temporary orders, obtain observation tele bed to team APAdmits.   Final Clinical Impressions(s) / ED Diagnoses   Final diagnoses:  None    New Prescriptions New Prescriptions   No medications on file     Francine Graven, DO 11/24/15 2219

## 2015-11-20 NOTE — ED Notes (Signed)
EDP at bedside  

## 2015-11-20 NOTE — H&P (Signed)
History and Physical    John [name] BJS:[id number] DOB: 06-Feb-1946 DOA: 11/20/2015  PCP: Mickie Hillier, MD   Patient coming from: Home   Chief Complaint: Dyspnea, orthopnea, PND  HPI: John [name] is a 70 y.o. male with medical history significant for hypertension, hyperlipidemia, complete heart block status post pacer placement, and insulin-dependent diabetes mellitus who presents the emergency department with worsening dyspnea. Patient was hospitalized in June of this year for acute CHF, found to have complete heart block, and was managed with diuresis and pacer placement. He had been doing well back at home, walking daily with his wife for exercise, until the insidious development of exertional dyspnea approximately one week ago. There was concomitant development of progressive bilateral pedal edema which is also worsened since onset approximately one week ago. Patient reports significant worsening over the past 1-2 days, manifest by inability to walk with his wife as usual due to dyspnea. Orthopnea and paroxysmal nocturnal dyspnea are also noted. The patient denies any chest pain or palpitations. He notes a 4-5 pound weight gain over the past few days. He denies any recent fevers, chills, or cough. He takes HCTZ at home, but no other diuretic.   ED Course: Upon arrival to the ED, patient is found to beafebrile, saturating well on room air, and with vital signs stable. EKG demonstrates a atrial-ventricular dual-paced rhythm. Chest x-ray features bilateral peribronchial thickening with central predominance suggestive of pulmonary vascular congestion. CMP features a mild and stable elevation in total bilirubin and CBC is notable for a thrombocytopenia to 134,000. BNP is elevated to 841 and troponin is also mildly elevated to 0.03. Patient was treated with 40 mg IV push of Lasix, remained hemodynamically stable, and will be observed on telemetry unit for ongoing evaluation and management of acute  CHF.   Review of Systems:  All other systems reviewed and apart from HPI, are negative.  Past Medical History:  Diagnosis Date  . Atrial flutter ([place]) 12/2010   Admitted with symptomatic bradycardia (HR 40s), atrial flutter with slow ventricular response 12/2010 + volume overload; AV nodal agents d/c'd and Pradaxa started; RFA in 01/2011  . CHF (congestive heart failure) ([place])   . Chronic combined systolic and diastolic heart failure (HCC)    echo 01/06/11: mild LVH, EF 65-70%, mod to severe LAE, mild RVE, mild RAE, PASP 32;   TEE 10/12: EF 45-50%   . Diabetes mellitus    non insulin dependant  . Hyperlipidemia   . Hypertension   . Osteoarthritis   . PSVT (paroxysmal supraventricular tachycardia) (HCC)    Possibly atrial flutter    Past Surgical History:  Procedure Laterality Date  . ATRIAL FLUTTER ABLATION N/A 02/07/2011   Procedure: ATRIAL FLUTTER ABLATION;  Surgeon: Evans Lance, MD;  Location: [facility] CATH LAB;  Service: Cardiovascular;  Laterality: N/A;  . CARDIAC ELECTROPHYSIOLOGY STUDY AND ABLATION  02/07/11  . EP IMPLANTABLE DEVICE N/A 08/29/2015   Procedure: Pacemaker Implant;  Surgeon: Evans Lance, MD;  Location: [place] CV LAB;  Service: Cardiovascular;  Laterality: N/A;  . KNEE ARTHROSCOPY  2005   left  . LUMBAR SPINE SURGERY     "I've had 6 ORs 1972 thru 2004"     reports that he quit smoking about 29 years ago. His smoking use included Cigarettes. He has a 10.00 pack-year smoking history. He has never used smokeless tobacco. He reports that he drinks alcohol. He reports that he does not use drugs.  No Known Allergies  Family History  Problem Relation Age of Onset  . Heart attack Mother   . Hypertension Mother   . Heart attack Father   . Hypertension Father   . Heart attack Brother      Prior to Admission medications   Medication Sig Start Date End Date Taking? Authorizing Provider  amLODipine (NORVASC) 10 MG tablet Take 1 tablet (10 mg total) by mouth  daily. 10/24/15  Yes Mikey Kirschner, MD  benazepril (LOTENSIN) 40 MG tablet Take 1 tablet (40 mg total) by mouth daily. 10/24/15  Yes Mikey Kirschner, MD  carvedilol (COREG) 25 MG tablet Take 1 tablet (25 mg total) by mouth 2 (two) times daily with a meal. 09/20/15  Yes Lendon Colonel, NP  hydrochlorothiazide (HYDRODIURIL) 25 MG tablet Take 1 tablet (25 mg total) by mouth daily. 10/24/15  Yes Mikey Kirschner, MD  Insulin Glargine (TOUJEO SOLOSTAR) 300 UNIT/ML SOPN Inject 45 Units into the skin daily.   Yes Historical Provider, MD  Magnesium 400 MG CAPS Take by mouth.   Yes Historical Provider, MD  Multiple Vitamins-Minerals (MULTIVITAMIN WITH MINERALS) tablet Take 1 tablet by mouth daily.   Yes Historical Provider, MD  Omega-3 Fatty Acids (FISH OIL) 1000 MG CAPS Take 1 capsule by mouth daily.   Yes Historical Provider, MD  Resveratrol 100 MG CAPS Take 1 capsule by mouth daily.   Yes Historical Provider, MD  rosuvastatin (CRESTOR) 20 MG tablet Take 1 tablet (20 mg total) by mouth daily. 10/24/15  Yes Mikey Kirschner, MD  sitaGLIPtan-metformin (JANUMET) 50-1000 MG per tablet Take 1 tablet by mouth 2 (two) times daily with a meal.     Yes Historical Provider, MD  tadalafil (CIALIS) 20 MG tablet Take 1 tablet (20 mg total) by mouth daily as needed. For erectile dysfunction 04/17/12  Yes Yehuda Savannah, MD  tamsulosin (FLOMAX) 0.4 MG CAPS capsule Take 1 capsule (0.4 mg total) by mouth at bedtime. 10/24/15  Yes Mikey Kirschner, MD    Physical Exam: Vitals:   11/20/15 1700 11/20/15 1715 11/20/15 1730 11/20/15 1759  BP: 141/77 141/77 139/75 (!) 144/72  Pulse: (!) 50 (!) 50 (!) 47 (!) 59  Resp: 12 19 19 18   Temp:  98.1 F (36.7 C)  98 F (36.7 C)  TempSrc:    Oral  SpO2: 97% 95% 95% 98%  Weight:    116.3 kg (256 lb 8 oz)  Height:    5' 11"  (1.803 m)      Constitutional: NAD, calm, comfortable Eyes: PERTLA, lids and conjunctivae normal ENMT: Mucous membranes are moist. Posterior pharynx  clear of any exudate or lesions.   Neck: normal, supple, no masses, no thyromegaly Respiratory: Crackles at b/l mid-lower lung fields. Normal respiratory effort.    Cardiovascular: S1 & S2 heard, regular rate and rhythm. 2+ pedal edema b/l. No carotid bruits.   Abdomen: No distension, no tenderness, no masses palpated. Bowel sounds normal.  Musculoskeletal: no clubbing / cyanosis. No joint deformity upper and lower extremities. Normal muscle tone.  Skin: no significant rashes, lesions, ulcers. Warm, dry, well-perfused. Neurologic: CN 2-12 grossly intact. Sensation intact, DTR normal. Strength 5/5 in all 4 limbs.  Psychiatric: Normal judgment and insight. Alert and oriented x 3. Normal mood and affect.     Labs on Admission: I have personally reviewed following labs and imaging studies  CBC:  Recent Labs Lab 11/20/15 1515  WBC 8.7  NEUTROABS 5.0  HGB 13.2  HCT 39.8  MCV 82.6  PLT 343*   Basic Metabolic Panel:  Recent Labs Lab 11/20/15 1515  NA 141  K 3.9  CL 110  CO2 23  GLUCOSE 143*  BUN 18  CREATININE 1.03  CALCIUM 9.3   GFR: Estimated Creatinine Clearance: 87.8 mL/min (by C-G formula based on SCr of 1.03 mg/dL). Liver Function Tests:  Recent Labs Lab 11/20/15 1515  AST 22  ALT 21  ALKPHOS 56  BILITOT 1.3*  PROT 7.5  ALBUMIN 4.1   No results for input(s): LIPASE, AMYLASE in the last 168 hours. No results for input(s): AMMONIA in the last 168 hours. Coagulation Profile: No results for input(s): INR, PROTIME in the last 168 hours. Cardiac Enzymes:  Recent Labs Lab 11/20/15 1515  TROPONINI 0.03*   BNP (last 3 results) No results for input(s): PROBNP in the last 8760 hours. HbA1C: No results for input(s): HGBA1C in the last 72 hours. CBG: No results for input(s): GLUCAP in the last 168 hours. Lipid Profile: No results for input(s): CHOL, HDL, LDLCALC, TRIG, CHOLHDL, LDLDIRECT in the last 72 hours. Thyroid Function Tests: No results for input(s):  TSH, T4TOTAL, FREET4, T3FREE, THYROIDAB in the last 72 hours. Anemia Panel: No results for input(s): VITAMINB12, FOLATE, FERRITIN, TIBC, IRON, RETICCTPCT in the last 72 hours. Urine analysis:    Component Value Date/Time   COLORURINE YELLOW 01/05/2014 [place] 01/05/2014 0744   LABSPEC >1.030 (H) 01/05/2014 0744   PHURINE 5.5 01/05/2014 0744   GLUCOSEU 100 (A) 01/05/2014 0744   HGBUR SMALL (A) 01/05/2014 0744   BILIRUBINUR SMALL (A) 01/05/2014 0744   KETONESUR TRACE (A) 01/05/2014 0744   PROTEINUR 100 (A) 01/05/2014 0744   UROBILINOGEN 0.2 01/05/2014 0744   NITRITE NEGATIVE 01/05/2014 0744   LEUKOCYTESUR NEGATIVE 01/05/2014 0744   Sepsis Labs: @LABRCNTIP (procalcitonin:4,lacticidven:4) )No results found for this or any previous visit (from the past 240 hour(s)).   Radiological Exams on Admission: Dg Chest 2 View  Result Date: 11/20/2015 CLINICAL DATA:  Shortness of breath. EXAM: CHEST  2 VIEW COMPARISON:  Chest radiograph 08/30/2015 FINDINGS: Dual lead cardiac pacemaker is seen. The cardiac silhouette is enlarged. Mediastinal contours appear intact. There is no evidence of or pneumothorax. There is bilateral peribronchial thickening with central predominance. Reticular opacities in the mid to lower a right lung are stable. Osseous structures are without acute abnormality. Soft tissues are grossly normal. IMPRESSION: Bilateral peribronchial thickening with central predominance. This may be seen with pulmonary arterial hypertension, pulmonary vascular congestion or peribronchial infiltrative processes. Enlargement of the cardiac silhouette. Stable reticular opacities in the right mid lung. Electronically Signed   By: Fidela Salisbury M.D.   On: 11/20/2015 16:12    EKG: Independently reviewed. A-V dual-paced rhythm.  Assessment/Plan   1. Acute CHF  - Presents with dyspnea, orthopnea, peripheral edema, wt gain, and found to have congestion on CXR and BNP 841  - TTE  (08/25/2015) with EF 50-55%, mild LVH, HK of basoinferolateral myocardium, mild MR  - Uncertain what put him into acute CHF; no angina, denies dietary indiscretion; possibly a pacer problem, will monitor on telemetry   - Given 40 mg IVP Lasix in ED and has begun to diurese  - Continue diuresis with Lasix 40 mg IV q12h  - Follow daily wts and strict I/O's; fluid-restrict diet and SLIV  - Plan to update TTE to help clarify etiology   - Continue Coreg, ACE-i  2. Complete heart block with pacer  - Pacer placed in June for CHB  -  HR in 70s in ED  - Monitor on telemetry    3. Insulin-dependent DM  - A1c 9.8% in June 2017  - Managed with Toujeo 45 units qD and Janumet at home; will hold these while in hospital  - Check CBG with meals and qHS  - Basal coverage with Lantus and Humalog TID and qHS according to moderate-intensity sliding-scale   4. Hypertension  - At goal currently  - Managed with Coreg, benazepril, and HCTZ at home, will continue with monitoring    5. Hyperlipidemia  - LDL 45 and HDL 76 earlier this month  - Continue Crestor and fish oil   6. Elevated troponin - Troponin minimally elevated to 0.03 on admission without anginal complaints  - Likely represents a demand ischemia in setting of acute CHF  - Monitor on telemetry for ischemic changes and trend troponin    DVT prophylaxis: sq Lovenox  Code Status: Full  Family Communication: Family updated at bedside  Disposition Plan: Observe on telemetry  Consults called: None Admission status: Observation    Vianne Bulls, MD [facility] Pager [phone]  If 7PM-7AM, please contact night-coverage [email or web address] Password [facility]  11/20/2015, 7:29 PM

## 2015-11-21 DIAGNOSIS — E1159 Type 2 diabetes mellitus with other circulatory complications: Secondary | ICD-10-CM | POA: Diagnosis present

## 2015-11-21 DIAGNOSIS — I110 Hypertensive heart disease with heart failure: Secondary | ICD-10-CM | POA: Diagnosis present

## 2015-11-21 DIAGNOSIS — I5043 Acute on chronic combined systolic (congestive) and diastolic (congestive) heart failure: Secondary | ICD-10-CM | POA: Diagnosis present

## 2015-11-21 DIAGNOSIS — I509 Heart failure, unspecified: Secondary | ICD-10-CM | POA: Diagnosis not present

## 2015-11-21 DIAGNOSIS — I442 Atrioventricular block, complete: Secondary | ICD-10-CM | POA: Diagnosis present

## 2015-11-21 DIAGNOSIS — Z87891 Personal history of nicotine dependence: Secondary | ICD-10-CM | POA: Diagnosis not present

## 2015-11-21 DIAGNOSIS — Z794 Long term (current) use of insulin: Secondary | ICD-10-CM | POA: Diagnosis not present

## 2015-11-21 DIAGNOSIS — E785 Hyperlipidemia, unspecified: Secondary | ICD-10-CM | POA: Diagnosis present

## 2015-11-21 DIAGNOSIS — R7989 Other specified abnormal findings of blood chemistry: Secondary | ICD-10-CM | POA: Diagnosis not present

## 2015-11-21 DIAGNOSIS — Z950 Presence of cardiac pacemaker: Secondary | ICD-10-CM | POA: Diagnosis not present

## 2015-11-21 DIAGNOSIS — I5031 Acute diastolic (congestive) heart failure: Secondary | ICD-10-CM | POA: Diagnosis not present

## 2015-11-21 DIAGNOSIS — R0600 Dyspnea, unspecified: Secondary | ICD-10-CM | POA: Diagnosis present

## 2015-11-21 DIAGNOSIS — I248 Other forms of acute ischemic heart disease: Secondary | ICD-10-CM | POA: Diagnosis present

## 2015-11-21 DIAGNOSIS — Z8249 Family history of ischemic heart disease and other diseases of the circulatory system: Secondary | ICD-10-CM | POA: Diagnosis not present

## 2015-11-21 DIAGNOSIS — I5041 Acute combined systolic (congestive) and diastolic (congestive) heart failure: Secondary | ICD-10-CM | POA: Diagnosis not present

## 2015-11-21 LAB — GLUCOSE, CAPILLARY
GLUCOSE-CAPILLARY: 203 mg/dL — AB (ref 65–99)
Glucose-Capillary: 163 mg/dL — ABNORMAL HIGH (ref 65–99)
Glucose-Capillary: 109 mg/dL — ABNORMAL HIGH (ref 65–99)

## 2015-11-21 LAB — BASIC METABOLIC PANEL
ANION GAP: 7 (ref 5–15)
BUN: 16 mg/dL (ref 6–20)
CALCIUM: 8.8 mg/dL — AB (ref 8.9–10.3)
CO2: 26 mmol/L (ref 22–32)
Chloride: 107 mmol/L (ref 101–111)
Creatinine, Ser: 0.98 mg/dL (ref 0.61–1.24)
Glucose, Bld: 107 mg/dL — ABNORMAL HIGH (ref 65–99)
Potassium: 3.3 mmol/L — ABNORMAL LOW (ref 3.5–5.1)
Sodium: 140 mmol/L (ref 135–145)

## 2015-11-21 LAB — TROPONIN I
TROPONIN I: 0.03 ng/mL — AB (ref <0.03)
TROPONIN I: 0.04 ng/mL — AB (ref <0.03)
Troponin I: 0.05 ng/mL (ref <0.03)

## 2015-11-21 MED ORDER — BENZONATATE 100 MG PO CAPS
200.0000 mg | ORAL_CAPSULE | Freq: Three times a day (TID) | ORAL | Status: DC | PRN
  Administered 2015-11-21 – 2015-11-23 (×4): 200 mg via ORAL
  Filled 2015-11-21 (×4): qty 2

## 2015-11-21 MED ORDER — POTASSIUM CHLORIDE CRYS ER 20 MEQ PO TBCR
40.0000 meq | EXTENDED_RELEASE_TABLET | ORAL | Status: AC
  Administered 2015-11-21 (×2): 40 meq via ORAL
  Filled 2015-11-21 (×2): qty 2

## 2015-11-21 NOTE — Care Management Obs Status (Signed)
[facility] NOTIFICATION   Patient Details  Name: KENYATTE CHATMON MRN: [id number] Date of Birth: 13-Jul-1945   Medicare Observation Status Notification Given:  Yes    Valen Mascaro, Chauncey Reading, RN 11/21/2015, 11:11 AM

## 2015-11-21 NOTE — Progress Notes (Signed)
PROGRESS NOTE    John [name]  MPN:[id number] DOB: [birth year] DOA: 11/20/2015 PCP: Mickie Hillier, MD    Brief Narrative:  89 yom with a past history of HTN, HLD, DM type 2, CHF, and complete heart block s/p pacer placement presented with complaints of dyspnea. Patient was admitted 08/2015  for acute CHF, found to have complete heart block, and was managed with pacer placement. While in the ED he was noted to have an elevated BNP of 841.0 and an elevated troponin of 0.03. CXR revealed bilateral peribronchial thickening with central predominance. EKG showed atrial-ventricular dual-paced complexes. Pt was admitted for further evaluation of acute CHF.    Assessment & Plan:   Principal Problem:   Acute CHF (congestive heart failure) (HCC) Active Problems:   DM type 2 causing vascular disease ([place])   Hyperlipidemia LDL goal <100   HYPERTENSION   Elevated troponin   Complete heart block ([place])   1. Acute diastolic CHF. Pt presents with an elevated BNP of 841.0. CXR showed bilateral peribronchial thickening with central predominance. Past echo 08/25/15 with an EF of 50-55%. He has had good urine output with IV lasix. Volume status since admission is -4.1L. Weight is down 6lbs since admission. Continue diuresis with IV lasix. Monitor intake and output. Continue coreg and ace inhibitors. He still has evidence of volume overload and will need further diuresis. Will check limited Echo to evaluate EF. 2. Elevated troponin. Likely demand ischemia in the setting of CHF. No chest pain or EKG changes. Will cycle troponin and monitor on telemetry.  3. Complete heart block s/p pacer 08/2015. Monitor on telemetry.  4. DM type 2. Continue basal insulin. Continue SSI. 5. HTN. Continue outpatient regimen. Continue monitoring.  6. HLD. Continue statins.    DVT prophylaxis: Lovenox Code Status: Full Family Communication: discussed with wife at the bedside Disposition Plan:Discharge home once improved     Consultants:   None   Procedures:     Antimicrobials:   None    Subjective: Feeling better. Shortness of breath is improving. No chest pain.   Objective: Vitals:   11/20/15 1759 11/20/15 2044 11/20/15 2100 11/21/15 0546  BP: (!) 144/72  (!) 145/77 134/78  Pulse: (!) 59  60 60  Resp: 18  20 20   Temp: 98 F (36.7 C)  98.2 F (36.8 C) 98.4 F (36.9 C)  TempSrc: Oral  Oral Oral  SpO2: 98% 98% 96% 97%  Weight: 116.3 kg (256 lb 8 oz)   113.7 kg (250 lb 9.6 oz)  Height: 5' 11"  (1.803 m)       Intake/Output Summary (Last 24 hours) at 11/21/15 0751 Last data filed at 11/21/15 0700  Gross per 24 hour  Intake                0 ml  Output             3500 ml  Net            -3500 ml   Filed Weights   11/20/15 1512 11/20/15 1759 11/21/15 0546  Weight: 111.1 kg (245 lb) 116.3 kg (256 lb 8 oz) 113.7 kg (250 lb 9.6 oz)    Examination:  General exam: Appears calm and comfortable  Respiratory system: minimal crackles at bases. Respiratory effort normal. Cardiovascular system: S1 & S2 heard, RRR. No JVD, murmurs, rubs, gallops or clicks. 1-2+ pedal edema. Gastrointestinal system: Abdomen is nondistended, soft and nontender. No organomegaly or masses felt. Normal bowel sounds heard. Central  nervous system: Alert and oriented. No focal neurological deficits. Extremities: Symmetric 5 x 5 power. Skin: No rashes, lesions or ulcers Psychiatry: Judgement and insight appear normal. Mood & affect appropriate.     Data Reviewed: I have personally reviewed following labs and imaging studies  CBC:  Recent Labs Lab 11/20/15 1515  WBC 8.7  NEUTROABS 5.0  HGB 13.2  HCT 39.8  MCV 82.6  PLT 762*   Basic Metabolic Panel:  Recent Labs Lab 11/20/15 1515 11/21/15 0533  NA 141 140  K 3.9 3.3*  CL 110 107  CO2 23 26  GLUCOSE 143* 107*  BUN 18 16  CREATININE 1.03 0.98  CALCIUM 9.3 8.8*   GFR: Estimated Creatinine Clearance: 91.3 mL/min (by C-G formula based on SCr of  0.98 mg/dL). Liver Function Tests:  Recent Labs Lab 11/20/15 1515  AST 22  ALT 21  ALKPHOS 56  BILITOT 1.3*  PROT 7.5  ALBUMIN 4.1   No results for input(s): LIPASE, AMYLASE in the last 168 hours. No results for input(s): AMMONIA in the last 168 hours. Coagulation Profile: No results for input(s): INR, PROTIME in the last 168 hours. Cardiac Enzymes:  Recent Labs Lab 11/20/15 1515 11/21/15 0059 11/21/15 0533  TROPONINI 0.03* 0.03* 0.05*   BNP (last 3 results) No results for input(s): PROBNP in the last 8760 hours. HbA1C: No results for input(s): HGBA1C in the last 72 hours. CBG:  Recent Labs Lab 11/20/15 2104  GLUCAP 119*   Lipid Profile: No results for input(s): CHOL, HDL, LDLCALC, TRIG, CHOLHDL, LDLDIRECT in the last 72 hours. Thyroid Function Tests: No results for input(s): TSH, T4TOTAL, FREET4, T3FREE, THYROIDAB in the last 72 hours. Anemia Panel: No results for input(s): VITAMINB12, FOLATE, FERRITIN, TIBC, IRON, RETICCTPCT in the last 72 hours. Sepsis Labs: No results for input(s): PROCALCITON, LATICACIDVEN in the last 168 hours.  No results found for this or any previous visit (from the past 240 hour(s)).       Radiology Studies: Dg Chest 2 View  Result Date: 11/20/2015 CLINICAL DATA:  Shortness of breath. EXAM: CHEST  2 VIEW COMPARISON:  Chest radiograph 08/30/2015 FINDINGS: Dual lead cardiac pacemaker is seen. The cardiac silhouette is enlarged. Mediastinal contours appear intact. There is no evidence of or pneumothorax. There is bilateral peribronchial thickening with central predominance. Reticular opacities in the mid to lower a right lung are stable. Osseous structures are without acute abnormality. Soft tissues are grossly normal. IMPRESSION: Bilateral peribronchial thickening with central predominance. This may be seen with pulmonary arterial hypertension, pulmonary vascular congestion or peribronchial infiltrative processes. Enlargement of the  cardiac silhouette. Stable reticular opacities in the right mid lung. Electronically Signed   By: Fidela Salisbury M.D.   On: 11/20/2015 16:12        Scheduled Meds: . amLODipine  10 mg Oral Daily  . benazepril  40 mg Oral Daily  . carvedilol  25 mg Oral BID WC  . enoxaparin (LOVENOX) injection  40 mg Subcutaneous Q24H  . furosemide  40 mg Intravenous Q12H  . hydrochlorothiazide  25 mg Oral Daily  . insulin aspart  0-15 Units Subcutaneous TID WC  . insulin glargine  38 Units Subcutaneous Daily  . multivitamin with minerals  1 tablet Oral Daily  . omega-3 acid ethyl esters  1 g Oral Daily  . potassium chloride  10 mEq Oral BID  . rosuvastatin  20 mg Oral Daily  . sodium chloride flush  3 mL Intravenous Q12H  . tamsulosin  0.4  mg Oral QHS   Continuous Infusions:    LOS: 0 days    Time spent: 25 minutes     Kathie Dike, MD [facility] If 7PM-7AM, please contact night-coverage [email or web address] Password [facility] 11/21/2015, 7:51 AM

## 2015-11-21 NOTE — Care Management Note (Signed)
Case Management Note  Patient Details  Name: KAYMEN ADRIAN MRN: [id number] Date of Birth: 07-01-1945  Subjective/Objective:   Patient is from home, ind with ADL's. He has a PCP, drives himself to appointments, and has insurance, reports no issues.                  Action/Plan: Anticipate DC home with self care. No CM needs.   Expected Discharge Date:  11/22/15               Expected Discharge Plan:  Home/Self Care  In-House Referral:  NA  Discharge planning Services  CM Consult  Post Acute Care Choice:  NA Choice offered to:  NA  DME Arranged:    DME Agency:     HH Arranged:    HH Agency:     Status of Service:  Completed, signed off  If discussed at [employer] of Stay Meetings, dates discussed:    Additional Comments:  Deshea Pooley, Chauncey Reading, RN 11/21/2015, 11:04 AM

## 2015-11-22 ENCOUNTER — Inpatient Hospital Stay (HOSPITAL_COMMUNITY): Payer: Commercial Managed Care - HMO

## 2015-11-22 DIAGNOSIS — I509 Heart failure, unspecified: Secondary | ICD-10-CM

## 2015-11-22 LAB — BASIC METABOLIC PANEL
Anion gap: 8 (ref 5–15)
BUN: 16 mg/dL (ref 6–20)
CALCIUM: 9.1 mg/dL (ref 8.9–10.3)
CHLORIDE: 105 mmol/L (ref 101–111)
CO2: 27 mmol/L (ref 22–32)
CREATININE: 0.92 mg/dL (ref 0.61–1.24)
Glucose, Bld: 90 mg/dL (ref 65–99)
Potassium: 3.5 mmol/L (ref 3.5–5.1)
SODIUM: 140 mmol/L (ref 135–145)

## 2015-11-22 LAB — ECHOCARDIOGRAM LIMITED
HEIGHTINCHES: 71 in
WEIGHTICAEL: 3920 [oz_av]

## 2015-11-22 LAB — GLUCOSE, CAPILLARY
GLUCOSE-CAPILLARY: 81 mg/dL (ref 65–99)
GLUCOSE-CAPILLARY: 195 mg/dL — AB (ref 65–99)
Glucose-Capillary: 176 mg/dL — ABNORMAL HIGH (ref 65–99)

## 2015-11-22 NOTE — Progress Notes (Signed)
PROGRESS NOTE    John [name]  FMB:[id number] DOB: 05/24/1945 DOA: 11/20/2015 PCP: Mickie Hillier, MD    Brief Narrative:  11 yom with a past history of HTN, HLD, DM type 2, CHF, and complete heart block s/p pacer placement presented with complaints of dyspnea. He was found to have decompensated CHF and was started on IV lasix. He has had good diuresis and will likely be able to transition to po diuretics in the next 24 hours. Since he has already had an echo in 6/17, a limited echo has been ordered to evaluate LVEF.   Assessment & Plan:   Principal Problem:   Acute diastolic CHF (congestive heart failure) (HCC) Active Problems:   DM type 2 causing vascular disease (HCC)   Hyperlipidemia LDL goal <100   HYPERTENSION   Elevated troponin   Complete heart block (HCC)   Acute CHF (congestive heart failure) ([place]) 1. Acute diastolic CHF. Pt presents with an elevated BNP of 841.0. CXR showed bilateral peribronchial thickening with central predominance. Past echo 08/25/15 with an EF of 50-55%. He has had good urine output with IV lasix. Volume status since admission is -7.0L. Weight is down 21lbs since admission. Continue diuresis with IV lasix. Monitor intake and output. Continue coreg and ace inhibitors. He still has evidence of volume overload and will need further diuresis. Will check limited Echo to evaluate EF. If EF is normal, anticipate possible discharge tomorrow with cardiology follow up 2. Elevated troponin. Likely demand ischemia in the setting of CHF. No chest pain or EKG changes. Will cycle troponin and monitor on telemetry.  3. Complete heart block s/p pacer 08/2015. Monitor on telemetry.  4. DM type 2. Continue basal insulin. Continue SSI. 5. HTN. Continue outpatient regimen. Continue monitoring.  6. HLD. Continue statins.    DVT prophylaxis: Lovenox Code Status: Full Family Communication: discussed with wife at the bedside bedside Disposition Plan: Discharge home once  improved    Consultants:   None   Procedures:   None   Antimicrobials:   None    Subjective: Shortness of breath improving. No chest pain. LE edema improving  Objective: Vitals:   11/21/15 1427 11/21/15 1951 11/21/15 2020 11/22/15 0523  BP: (!) 153/80  (!) 143/78 136/73  Pulse: 74  66 65  Resp: 20     Temp: 98.2 F (36.8 C)  98.6 F (37 C) 98.4 F (36.9 C)  TempSrc: Oral  Oral Oral  SpO2: 97% 97% 96% 96%  Weight:    111.1 kg (245 lb)  Height:    5' 11"  (1.803 m)    Intake/Output Summary (Last 24 hours) at 11/22/15 0726 Last data filed at 11/22/15 0525  Gross per 24 hour  Intake              600 ml  Output             2850 ml  Net            -2250 ml   Filed Weights   11/20/15 1759 11/21/15 0546 11/22/15 0523  Weight: 116.3 kg (256 lb 8 oz) 113.7 kg (250 lb 9.6 oz) 111.1 kg (245 lb)    Examination:  General exam: Appears calm and comfortable  Respiratory system: Clear to auscultation. Respiratory effort normal. Cardiovascular system: S1 & S2 heard, RRR. No JVD, murmurs, rubs, gallops or clicks. 1+ pedal edema. Gastrointestinal system: Abdomen is nondistended, soft and nontender. No organomegaly or masses felt. Normal bowel sounds heard. Central nervous system:  Alert and oriented. No focal neurological deficits. Extremities: Symmetric 5 x 5 power. Skin: No rashes, lesions or ulcers Psychiatry: Judgement and insight appear normal. Mood & affect appropriate.     Data Reviewed: I have personally reviewed following labs and imaging studies  CBC:  Recent Labs Lab 11/20/15 1515  WBC 8.7  NEUTROABS 5.0  HGB 13.2  HCT 39.8  MCV 82.6  PLT 553*   Basic Metabolic Panel:  Recent Labs Lab 11/20/15 1515 11/21/15 0533 11/22/15 0543  NA 141 140 140  K 3.9 3.3* 3.5  CL 110 107 105  CO2 23 26 27   GLUCOSE 143* 107* 90  BUN 18 16 16   CREATININE 1.03 0.98 0.92  CALCIUM 9.3 8.8* 9.1   GFR: Estimated Creatinine Clearance: 96 mL/min (by C-G formula  based on SCr of 0.92 mg/dL). Liver Function Tests:  Recent Labs Lab 11/20/15 1515  AST 22  ALT 21  ALKPHOS 56  BILITOT 1.3*  PROT 7.5  ALBUMIN 4.1   No results for input(s): LIPASE, AMYLASE in the last 168 hours. No results for input(s): AMMONIA in the last 168 hours. Coagulation Profile: No results for input(s): INR, PROTIME in the last 168 hours. Cardiac Enzymes:  Recent Labs Lab 11/20/15 1515 11/21/15 0059 11/21/15 0533 11/21/15 0846  TROPONINI 0.03* 0.03* 0.05* 0.04*   BNP (last 3 results) No results for input(s): PROBNP in the last 8760 hours. HbA1C: No results for input(s): HGBA1C in the last 72 hours. CBG:  Recent Labs Lab 11/20/15 2104 11/21/15 1142 11/21/15 1656 11/21/15 2032  GLUCAP 119* 163* 109* 203*   Lipid Profile: No results for input(s): CHOL, HDL, LDLCALC, TRIG, CHOLHDL, LDLDIRECT in the last 72 hours. Thyroid Function Tests: No results for input(s): TSH, T4TOTAL, FREET4, T3FREE, THYROIDAB in the last 72 hours. Anemia Panel: No results for input(s): VITAMINB12, FOLATE, FERRITIN, TIBC, IRON, RETICCTPCT in the last 72 hours. Sepsis Labs: No results for input(s): PROCALCITON, LATICACIDVEN in the last 168 hours.  No results found for this or any previous visit (from the past 240 hour(s)).       Radiology Studies: Dg Chest 2 View  Result Date: 11/20/2015 CLINICAL DATA:  Shortness of breath. EXAM: CHEST  2 VIEW COMPARISON:  Chest radiograph 08/30/2015 FINDINGS: Dual lead cardiac pacemaker is seen. The cardiac silhouette is enlarged. Mediastinal contours appear intact. There is no evidence of or pneumothorax. There is bilateral peribronchial thickening with central predominance. Reticular opacities in the mid to lower a right lung are stable. Osseous structures are without acute abnormality. Soft tissues are grossly normal. IMPRESSION: Bilateral peribronchial thickening with central predominance. This may be seen with pulmonary arterial  hypertension, pulmonary vascular congestion or peribronchial infiltrative processes. Enlargement of the cardiac silhouette. Stable reticular opacities in the right mid lung. Electronically Signed   By: Fidela Salisbury M.D.   On: 11/20/2015 16:12        Scheduled Meds: . amLODipine  10 mg Oral Daily  . benazepril  40 mg Oral Daily  . carvedilol  25 mg Oral BID WC  . enoxaparin (LOVENOX) injection  40 mg Subcutaneous Q24H  . furosemide  40 mg Intravenous Q12H  . hydrochlorothiazide  25 mg Oral Daily  . insulin aspart  0-15 Units Subcutaneous TID WC  . insulin glargine  38 Units Subcutaneous Daily  . multivitamin with minerals  1 tablet Oral Daily  . omega-3 acid ethyl esters  1 g Oral Daily  . potassium chloride  10 mEq Oral BID  . rosuvastatin  20 mg Oral Daily  . sodium chloride flush  3 mL Intravenous Q12H  . tamsulosin  0.4 mg Oral QHS   Continuous Infusions:    LOS: 1 day    Time spent: 25 minutes    Kathie Dike, MD [facility] If 7PM-7AM, please contact night-coverage [email or web address] Password [facility] 11/22/2015, 7:26 AM

## 2015-11-22 NOTE — Consult Note (Signed)
   [facility] Inpatient Consult   11/22/2015  John [name] Jul 31, 1945 [id number]   Spoke with patient and wife at bedside regarding [facility] services. Patient does not wish to participate with [facility] at this time. Patient given [facility] brochure and contact information for future reference.  Of note, [facility] Care Management services would not replace or interfere with any services that are arranged by inpatient case management or social work. For additional questions or referrals please contact:  Royetta Crochet. Laymond Purser, RN, BSN, [facility] Liaison [phone]

## 2015-11-22 NOTE — Progress Notes (Signed)
Telemetry called and pt had 9 beat run of VTACH.  Asymptomatic. MD notified. No orders placed. RN will continue to monitor. Oswald Hillock, RN

## 2015-11-22 NOTE — Progress Notes (Signed)
HR staying at low 50's.  HR at one point was 46. Asymptomatic. 25 mg of Carvedilol due. MD notified and aware. Ordered to still give med to patient. Oswald Hillock, RN

## 2015-11-23 DIAGNOSIS — I5041 Acute combined systolic (congestive) and diastolic (congestive) heart failure: Secondary | ICD-10-CM

## 2015-11-23 DIAGNOSIS — I5031 Acute diastolic (congestive) heart failure: Secondary | ICD-10-CM

## 2015-11-23 LAB — GLUCOSE, CAPILLARY
GLUCOSE-CAPILLARY: 260 mg/dL — AB (ref 65–99)
Glucose-Capillary: 166 mg/dL — ABNORMAL HIGH (ref 65–99)
Glucose-Capillary: 236 mg/dL — ABNORMAL HIGH (ref 65–99)
Glucose-Capillary: 185 mg/dL — ABNORMAL HIGH (ref 65–99)

## 2015-11-23 LAB — BASIC METABOLIC PANEL
Anion gap: 10 (ref 5–15)
BUN: 18 mg/dL (ref 6–20)
CALCIUM: 9.3 mg/dL (ref 8.9–10.3)
CHLORIDE: 100 mmol/L — AB (ref 101–111)
CO2: 27 mmol/L (ref 22–32)
CREATININE: 1.08 mg/dL (ref 0.61–1.24)
GFR calc Af Amer: >60 mL/min (ref >60)
GFR calc non Af Amer: >60 mL/min (ref >60)
Glucose, Bld: 166 mg/dL — ABNORMAL HIGH (ref 65–99)
Potassium: 3.7 mmol/L (ref 3.5–5.1)
Sodium: 137 mmol/L (ref 135–145)

## 2015-11-23 MED ORDER — BENZONATATE 200 MG PO CAPS: 200.0000 mg | ORAL_CAPSULE | Freq: Three times a day (TID) | ORAL | 0 refills | Status: DC | PRN

## 2015-11-23 MED ORDER — FUROSEMIDE 40 MG PO TABS: 40.0000 mg | ORAL_TABLET | Freq: Every day | ORAL | Status: DC

## 2015-11-23 MED ORDER — FUROSEMIDE 40 MG PO TABS: 40.0000 mg | ORAL_TABLET | Freq: Every day | ORAL | 0 refills | Status: DC

## 2015-11-23 MED ORDER — POTASSIUM CHLORIDE ER 20 MEQ PO TBCR: 20.0000 meq | EXTENDED_RELEASE_TABLET | Freq: Every day | ORAL | 0 refills | Status: DC

## 2015-11-23 NOTE — Progress Notes (Signed)
Discharged PT per MD order and protocol. Discharge handouts reviewed/explained. Education completed. Prescriptions given to patient.   Pt verbalized understanding and left with all belongings. VSS. IV catheter D/C.  Patient wheeled down by staff member.

## 2015-11-23 NOTE — Care Management Important Message (Signed)
Important Message  Patient Details  Name: John [name] MRN: [id number] Date of Birth: 24-Dec-1945   Medicare Important Message Given:  Yes    Makai Agostinelli, Chauncey Reading, RN 11/23/2015, 2:42 PM

## 2015-11-23 NOTE — Progress Notes (Signed)
PROGRESS NOTE  John [name] TMY:[id number] DOB: [birth year] DOA: 11/20/2015 PCP: Mickie Hillier, MD  Brief Narrative: 29 yom diastolic heart failure presented with shortness of breath. Admitted for acute diastolic CHF, improved rapidly with diuresis.  Assessment/Plan: 1. Acute diastolic CHF. Appears euvolemic at this time. Elevated BNP of 841.0 on admission. CXR revealed bilateral peribronchial thickening with central predominance. He has a past echo 08/25/15 with an EF of 50-55%. ECHO as below shows decreased ejection fraction, however this is consistent with recent Myoview study and is not a new finding. His weight is down 16 pounds and -8L since admission.  2. Elevated troponin. Likely demand ischemia in the setting of CHF.  3. Complete heart block s/p pacer 08/2015.  4. DM type 2. Blood sugars are stable.  5. HTN. Blood pressures are stable.    He appears euvolemic and to be at baseline at this point. Discussed informally with cardiology, we will arrange for outpatient follow-up with Dr. Pennelope Bracken. Patient already has f/u with Dr. Lovena Le.  Discharge home on Lasix. D/c HCTZ.  DVT prophylaxis: Lovenox  Code Status: Full  Family Communication: Wife bedside Disposition Plan: Discharge home.   Murray Hodgkins, MD  [facility] Direct contact: [phone] --Via amion app OR  --[email or web address]; password [email or web address]  7PM-7AM contact night coverage as above 11/23/2015, 7:20 AM  LOS: 2 days   Consultants:  None   Procedures:  Echo  - Left ventricle: The cavity size was normal. Systolic function was   mildly to moderately reduced. The estimated ejection fraction was   in the range of 40% to 45%. - Aortic valve: There was mild stenosis. - Mitral valve: There was mild regurgitation. - Left atrium: The atrium was moderately dilated. - Atrial septum: No defect or patent foramen ovale was identified.  Antimicrobials:  None   HPI/Subjective: Feels better. Slept well. Denies pain,  nausea, and vomiting. Swelling has improved. No chest pain or shortness of breath.  Objective: Vitals:   11/22/15 0523 11/22/15 1443 11/22/15 2052 11/23/15 0558  BP: 136/73 130/61 (!) 131/54 131/71  Pulse: 65 (!) 50 (!) 51 72  Resp:  16    Temp: 98.4 F (36.9 C) 98.4 F (36.9 C) 98.4 F (36.9 C) 98.5 F (36.9 C)  TempSrc: Oral Oral Oral Oral  SpO2: 96% 95% 95% 92%  Weight: 111.1 kg (245 lb)   109.1 kg (240 lb 8 oz)  Height: 5' 11"  (1.803 m)   5' 11"  (1.803 m)    Intake/Output Summary (Last 24 hours) at 11/23/15 0720 Last data filed at 11/23/15 0100  Gross per 24 hour  Intake              720 ml  Output             3325 ml  Net            -2605 ml     Filed Weights   11/21/15 0546 11/22/15 0523 11/23/15 0558  Weight: 113.7 kg (250 lb 9.6 oz) 111.1 kg (245 lb) 109.1 kg (240 lb 8 oz)    Exam:    Constitutional:  . Appears calm and comfortable .  Respiratory:  . CTA bilaterally, no w/r/r.  . Respiratory effort normal. No retractions or accessory muscle use Cardiovascular:  . RRR, no m/r/g . No significant LE extremity edema    I have personally reviewed following labs and imaging studies:  CBC unremarkable.   Scheduled Meds: . amLODipine  10 mg Oral Daily  . benazepril  40 mg Oral Daily  . carvedilol  25 mg Oral BID WC  . enoxaparin (LOVENOX) injection  40 mg Subcutaneous Q24H  . furosemide  40 mg Intravenous Q12H  . hydrochlorothiazide  25 mg Oral Daily  . insulin aspart  0-15 Units Subcutaneous TID WC  . insulin glargine  38 Units Subcutaneous Daily  . multivitamin with minerals  1 tablet Oral Daily  . omega-3 acid ethyl esters  1 g Oral Daily  . potassium chloride  10 mEq Oral BID  . rosuvastatin  20 mg Oral Daily  . sodium chloride flush  3 mL Intravenous Q12H  . tamsulosin  0.4 mg Oral QHS   Continuous Infusions:   Principal Problem:   Acute diastolic CHF (congestive heart failure) (HCC) Active Problems:   DM type 2 causing vascular disease  (HCC)   Hyperlipidemia LDL goal <100   HYPERTENSION   Elevated troponin   Complete heart block (HCC)   Acute CHF (congestive heart failure) ([place])   LOS: 2 days   Time spent 25 minutes   By signing my name below, I, Collene Leyden, attest that this documentation has been prepared under the direction and in the presence of Murray Hodgkins, MD. Electronically signed: Collene Leyden, Scribe. 11/23/15 11:30 PM  I personally performed the services described in this documentation. All medical record entries made by the scribe were at my direction. I have reviewed the chart and agree that the record reflects my personal performance and is accurate and complete. Murray Hodgkins, MD

## 2015-11-23 NOTE — Discharge Summary (Addendum)
Physician Discharge Summary  John [name] PXT:[id number] DOB: July 02, 1945 DOA: 11/20/2015  PCP: Mickie Hillier, MD  Admit date: 11/20/2015 Discharge date: 11/23/2015  Recommendations for Outpatient Follow-up:  1. Follow-up combined systolic/diastolic CHF. Consider repeat BMP on follow-up.  Follow-up Information    [name], MD Follow up on 11/25/2015.   Specialty:  Cardiology Why:  at 8:45 am Contact information: [place] [address] [phone]        [name], MD Follow up on 01/04/2016.   Specialty:  Cardiology Why:  at 8:40 am Contact information: [place] [address] [phone]        Mickie Hillier, MD. Go on 11/29/2015.   Specialty:  Family Medicine Why:  follow up with your wellness visit as well as a follow up from your hopital visit  Contact information: [place] [address] [phone]          Discharge Diagnoses:  1. Acute systolic/diastolic CHF 2. Elevated troponin secondary to demand ischemia 3. DM type 2  4. HTN   Discharge Condition: Improved  Disposition: Home   Diet recommendation: Carb modified   Filed Weights   11/21/15 0546 11/22/15 0523 11/23/15 0558  Weight: 113.7 kg (250 lb 9.6 oz) 111.1 kg (245 lb) 109.1 kg (240 lb 8 oz)    History of present illness:  69 yom diastolic heart failure presented with shortness of breath. Admitted for acute diastolic CHF.  Hospital Course:  Rapidly improved with IV diuresis with substantial weight loss. Achieved euvolemic. Limited echo demonstrated somewhat depressed ejection fraction, however compared to results from [facility] June/2016, these results are not new findings. Discussed with cardiology team M. Rhodia Albright, recommendation for outpatient follow-up which they will arrange. Hydrochlorothiazide stopped. Discharged on Lasix and potassium supplementation.  1. Acute systolic/diastolic CHF. Appears euvolemic at this time. Elevated BNP of  841.0 on admission. CXR revealed bilateral peribronchial thickening with central predominance. He has a past echo 08/25/15 with an EF of 50-55%. ECHO as below shows decreased ejection fraction, however this is consistent with recent Myoview study and is not a new finding. His weight is down 16 pounds and -8L since admission.  2. Elevated troponin. Likely demand ischemia in the setting of CHF.  3. Complete heart block s/p pacer 08/2015.  4. DM type 2. Blood sugars are stable.  5. HTN. Blood pressures are stable.   Consultants:  None   Procedures:  Echo  - Left ventricle: The cavity size was normal. Systolic function was mildly to moderately reduced. The estimated ejection fraction was in the range of 40% to 45%. - Aortic valve: There was mild stenosis. - Mitral valve: There was mild regurgitation. - Left atrium: The atrium was moderately dilated. - Atrial septum: No defect or patent foramen ovale was identified.  Antimicrobials:  None   Discharge Instructions  Discharge Instructions    (HEART FAILURE PATIENTS) Call MD:  Anytime you have any of the following symptoms: 1) 3 pound weight gain in 24 hours or 5 pounds in 1 week 2) shortness of breath, with or without a dry hacking cough 3) swelling in the hands, feet or stomach 4) if you have to sleep on extra pillows at night in order to breathe.    Complete by:  As directed   Diet - low sodium heart healthy    Complete by:  As directed   Diet Carb Modified    Complete by:  As directed   Discharge  instructions    Complete by:  As directed   Call your physician or seek immediate medical attention for swelling, shortness of breath, weight gain or worsening of condition.   Increase activity slowly    Complete by:  As directed       Medication List    STOP taking these medications   hydrochlorothiazide 25 MG tablet Commonly known as:  HYDRODIURIL     TAKE these medications   amLODipine 10 MG tablet Commonly known as:   NORVASC Take 1 tablet (10 mg total) by mouth daily.   benazepril 40 MG tablet Commonly known as:  LOTENSIN Take 1 tablet (40 mg total) by mouth daily.   benzonatate 200 MG capsule Commonly known as:  TESSALON Take 1 capsule (200 mg total) by mouth 3 (three) times daily as needed for cough.   carvedilol 25 MG tablet Commonly known as:  COREG Take 1 tablet (25 mg total) by mouth 2 (two) times daily with a meal.   Fish Oil 1000 MG Caps Take 1 capsule by mouth daily.   furosemide 40 MG tablet Commonly known as:  LASIX Take 1 tablet (40 mg total) by mouth daily. Start taking on:  11/24/2015   Magnesium 400 MG Caps Take by mouth.   multivitamin with minerals tablet Take 1 tablet by mouth daily.   Potassium Chloride ER 20 MEQ Tbcr Take 20 mEq by mouth daily.   Resveratrol 100 MG Caps Take 1 capsule by mouth daily.   rosuvastatin 20 MG tablet Commonly known as:  CRESTOR Take 1 tablet (20 mg total) by mouth daily.   sitaGLIPtin-metformin 50-1000 MG tablet Commonly known as:  JANUMET Take 1 tablet by mouth 2 (two) times daily with a meal.   tadalafil 20 MG tablet Commonly known as:  CIALIS Take 1 tablet (20 mg total) by mouth daily as needed. For erectile dysfunction   tamsulosin 0.4 MG Caps capsule Commonly known as:  FLOMAX Take 1 capsule (0.4 mg total) by mouth at bedtime.   TOUJEO SOLOSTAR 300 UNIT/ML Sopn Generic drug:  Insulin Glargine Inject 45 Units into the skin daily.      No Known Allergies  The results of significant diagnostics from this hospitalization (including imaging, microbiology, ancillary and laboratory) are listed below for reference.    Significant Diagnostic Studies: Dg Chest 2 View  Result Date: 11/20/2015 CLINICAL DATA:  Shortness of breath. EXAM: CHEST  2 VIEW COMPARISON:  Chest radiograph 08/30/2015 FINDINGS: Dual lead cardiac pacemaker is seen. The cardiac silhouette is enlarged. Mediastinal contours appear intact. There is no evidence  of or pneumothorax. There is bilateral peribronchial thickening with central predominance. Reticular opacities in the mid to lower a right lung are stable. Osseous structures are without acute abnormality. Soft tissues are grossly normal. IMPRESSION: Bilateral peribronchial thickening with central predominance. This may be seen with pulmonary arterial hypertension, pulmonary vascular congestion or peribronchial infiltrative processes. Enlargement of the cardiac silhouette. Stable reticular opacities in the right mid lung. Electronically Signed   By: Fidela Salisbury M.D.   On: 11/20/2015 16:12    Labs: Basic Metabolic Panel:  Recent Labs Lab 11/20/15 1515 11/21/15 0533 11/22/15 0543 11/23/15 0547  NA 141 140 140 137  K 3.9 3.3* 3.5 3.7  CL 110 107 105 100*  CO2 23 26 27 27   GLUCOSE 143* 107* 90 166*  BUN 18 16 16 18   CREATININE 1.03 0.98 0.92 1.08  CALCIUM 9.3 8.8* 9.1 9.3   Liver Function Tests:  Recent  Labs Lab 11/20/15 1515  AST 22  ALT 21  ALKPHOS 56  BILITOT 1.3*  PROT 7.5  ALBUMIN 4.1   CBC:  Recent Labs Lab 11/20/15 1515  WBC 8.7  NEUTROABS 5.0  HGB 13.2  HCT 39.8  MCV 82.6  PLT 134*   Cardiac Enzymes:  Recent Labs Lab 11/20/15 1515 11/21/15 0059 11/21/15 0533 11/21/15 0846  TROPONINI 0.03* 0.03* 0.05* 0.04*     Recent Labs  08/25/15 1037 11/20/15 1515  BNP 717.0* 841.0*   CBG:  Recent Labs Lab 11/22/15 1616 11/22/15 2049 11/23/15 0753 11/23/15 1151 11/23/15 1552  GLUCAP 176* 260* 166* 236* 185*    Principal Problem:   Acute diastolic CHF (congestive heart failure) (HCC) Active Problems:   DM type 2 causing vascular disease (HCC)   Hyperlipidemia LDL goal <100   HYPERTENSION   Elevated troponin   Complete heart block (HCC)   Acute CHF (congestive heart failure) ([place])   Time coordinating discharge: 35 minutes   Signed:  Murray Hodgkins, MD [facility] 11/23/2015, 5:41 PM  By signing my name below, I, Collene Leyden, attest that this documentation has been prepared under the direction and in the presence of Murray Hodgkins, MD. Electronically signed: Collene Leyden, Scribe. 11/23/15 11:30 PM  I personally performed the services described in this documentation. All medical record entries made by the scribe were at my direction. I have reviewed the chart and agree that the record reflects my personal performance and is accurate and complete. Murray Hodgkins, MD

## 2015-11-25 ENCOUNTER — Encounter: Payer: Self-pay | Admitting: Internal Medicine

## 2015-11-25 ENCOUNTER — Encounter: Payer: Commercial Managed Care - HMO | Admitting: Family Medicine

## 2015-11-25 ENCOUNTER — Ambulatory Visit: Payer: Commercial Managed Care - HMO | Admitting: Family Medicine

## 2015-11-25 ENCOUNTER — Encounter: Payer: Self-pay | Admitting: *Deleted

## 2015-11-25 ENCOUNTER — Ambulatory Visit (INDEPENDENT_AMBULATORY_CARE_PROVIDER_SITE_OTHER): Payer: Commercial Managed Care - HMO | Admitting: Internal Medicine

## 2015-11-25 VITALS — BP 110/58 | HR 86 | Ht 71.0 in | Wt 246.0 lb

## 2015-11-25 DIAGNOSIS — R001 Bradycardia, unspecified: Secondary | ICD-10-CM

## 2015-11-25 DIAGNOSIS — I4892 Unspecified atrial flutter: Secondary | ICD-10-CM | POA: Diagnosis not present

## 2015-11-25 DIAGNOSIS — I442 Atrioventricular block, complete: Secondary | ICD-10-CM | POA: Diagnosis not present

## 2015-11-25 LAB — CUP PACEART INCLINIC DEVICE CHECK
Battery Remaining Longevity: 70 mo
Brady Statistic AP VP Percent: 73 %
Brady Statistic AS VP Percent: 27 %
Brady Statistic AS VS Percent: 0 %
Date Time Interrogation Session: 20170901104343
Implantable Lead Implant Date: 20170605
Implantable Lead Location: 753860
Implantable Lead Model: 5076
Lead Channel Impedance Value: 488 Ohm
Lead Channel Impedance Value: 488 Ohm
Lead Channel Pacing Threshold Amplitude: 0.75 V
Lead Channel Pacing Threshold Amplitude: 1 V
Lead Channel Pacing Threshold Pulse Width: 0.4 ms
Lead Channel Pacing Threshold Pulse Width: 0.4 ms
Lead Channel Pacing Threshold Pulse Width: 0.64 ms
Lead Channel Sensing Intrinsic Amplitude: 11.2 mV
Lead Channel Setting Pacing Amplitude: 2.5 V
Lead Channel Setting Pacing Amplitude: 5 V
Lead Channel Setting Pacing Pulse Width: 0.4 ms
Lead Channel Setting Sensing Sensitivity: 2.8 mV
MDC IDC LEAD IMPLANT DT: 20170605
MDC IDC LEAD LOCATION: 753859
MDC IDC MSMT BATTERY IMPEDANCE: 100 Ohm
MDC IDC MSMT BATTERY VOLTAGE: 2.78 V
MDC IDC MSMT LEADCHNL RA IMPEDANCE VALUE: 541 Ohm
MDC IDC MSMT LEADCHNL RA IMPEDANCE VALUE: 541 Ohm
MDC IDC MSMT LEADCHNL RA PACING THRESHOLD AMPLITUDE: 2.75 V
MDC IDC MSMT LEADCHNL RA PACING THRESHOLD AMPLITUDE: 2.75 V
MDC IDC MSMT LEADCHNL RA PACING THRESHOLD PULSEWIDTH: 0.64 ms
MDC IDC MSMT LEADCHNL RV PACING THRESHOLD AMPLITUDE: 0.75 V
MDC IDC MSMT LEADCHNL RV PACING THRESHOLD AMPLITUDE: 1 V
MDC IDC MSMT LEADCHNL RV PACING THRESHOLD PULSEWIDTH: 0.4 ms
MDC IDC MSMT LEADCHNL RV PACING THRESHOLD PULSEWIDTH: 0.4 ms
MDC IDC MSMT LEADCHNL RV SENSING INTR AMPL: 11.2 mV
MDC IDC STAT BRADY AP VS PERCENT: 0 %

## 2015-11-25 MED ORDER — APIXABAN 2.5 MG PO TABS: 5.0000 mg | ORAL_TABLET | Freq: Two times a day (BID) | ORAL | Status: DC

## 2015-11-25 NOTE — Progress Notes (Addendum)
HPI Mr. Dohrman returns today for followup. He is a pleasant 70 yo man who was admitted with CHF and atrial flutter remotely who underwent catheter ablation years ago. He underwent PPM insertion 3 months ago. He was noted to have elevated atrial threshold and returns today for followup. He was in the hospital with fluid overload. He was diuresed. A cXR demonstrated that his atrial lead had dislodged. His left arm swelling has resolved.   No Known Allergies   Current Outpatient Prescriptions  Medication Sig Dispense Refill  . amLODipine (NORVASC) 10 MG tablet Take 1 tablet (10 mg total) by mouth daily. 90 tablet 1  . benazepril (LOTENSIN) 40 MG tablet Take 1 tablet (40 mg total) by mouth daily. 90 tablet 1  . benzonatate (TESSALON) 200 MG capsule Take 1 capsule (200 mg total) by mouth 3 (three) times daily as needed for cough. 60 capsule 0  . carvedilol (COREG) 25 MG tablet Take 1 tablet (25 mg total) by mouth 2 (two) times daily with a meal. 180 tablet 3  . furosemide (LASIX) 40 MG tablet Take 1 tablet (40 mg total) by mouth daily. 30 tablet 0  . Insulin Glargine (TOUJEO SOLOSTAR) 300 UNIT/ML SOPN Inject 45 Units into the skin daily.    . Magnesium 400 MG CAPS Take by mouth.    . Multiple Vitamins-Minerals (MULTIVITAMIN WITH MINERALS) tablet Take 1 tablet by mouth daily.    . Omega-3 Fatty Acids (FISH OIL) 1000 MG CAPS Take 1 capsule by mouth daily.    . potassium chloride 20 MEQ TBCR Take 20 mEq by mouth daily. 30 tablet 0  . Resveratrol 100 MG CAPS Take 1 capsule by mouth daily.    . rosuvastatin (CRESTOR) 20 MG tablet Take 1 tablet (20 mg total) by mouth daily. 90 tablet 1  . sitaGLIPtan-metformin (JANUMET) 50-1000 MG per tablet Take 1 tablet by mouth 2 (two) times daily with a meal.      . tadalafil (CIALIS) 20 MG tablet Take 1 tablet (20 mg total) by mouth daily as needed. For erectile dysfunction 10 tablet 6  . tamsulosin (FLOMAX) 0.4 MG CAPS capsule Take 1 capsule (0.4 mg total) by mouth  at bedtime. 90 capsule 1   No current facility-administered medications for this visit.      Past Medical History:  Diagnosis Date  . Atrial flutter ([place]) 12/2010   Admitted with symptomatic bradycardia (HR 40s), atrial flutter with slow ventricular response 12/2010 + volume overload; AV nodal agents d/c'd and Pradaxa started; RFA in 01/2011  . CHF (congestive heart failure) ([place])   . Chronic combined systolic and diastolic heart failure (HCC)    echo 01/06/11: mild LVH, EF 65-70%, mod to severe LAE, mild RVE, mild RAE, PASP 32;   TEE 10/12: EF 45-50%   . Diabetes mellitus    non insulin dependant  . Hyperlipidemia   . Hypertension   . Osteoarthritis   . PSVT (paroxysmal supraventricular tachycardia) (HCC)    Possibly atrial flutter    ROS:   All systems reviewed and negative except as noted in the HPI.   Past Surgical History:  Procedure Laterality Date  . ATRIAL FLUTTER ABLATION N/A 02/07/2011   Procedure: ATRIAL FLUTTER ABLATION;  Surgeon: Evans Lance, MD;  Location[facility] CATH LAB;  Service: Cardiovascular;  Laterality: N/A;  . CARDIAC ELECTROPHYSIOLOGY STUDY AND ABLATION  02/07/11  . EP IMPLANTABLE DEVICE N/A 08/29/2015   Procedure: Pacemaker Implant;  Surgeon: Evans Lance, MD;  Location: [place] [facility]  CV LAB;  Service: Cardiovascular;  Laterality: N/A;  . KNEE ARTHROSCOPY  2005   left  . LUMBAR SPINE SURGERY     "I've had 6 ORs 1972 thru 2004"     Family History  Problem Relation Age of Onset  . Heart attack Mother   . Hypertension Mother   . Heart attack Father   . Hypertension Father   . Heart attack Brother      Social History   Social History  . Marital status: Married    Spouse name: N/A  . Number of children: N/A  . Years of education: N/A   Occupational History  . Designer, industrial/product Retired   Social History Main Topics  . Smoking status: Former Smoker    Packs/day: 1.00    Years: 10.00    Types: Cigarettes    Quit date: 03/26/1986  .  Smokeless tobacco: Never Used     Comment: "stopped cigarette  smoking 1988"  . Alcohol use 0.0 oz/week     Comment: "quit alcohol ~ 2007"  . Drug use: No  . Sexual activity: Yes    Partners: Female   Other Topics Concern  . Not on file   Social History Narrative  . No narrative on file     Pulse 86   Ht 5' 11"  (1.803 m)   Wt 246 lb (111.6 kg)   SpO2 98%   BMI 34.31 kg/m   Physical Exam:  Well appearing 70 yo man, NAD HEENT: Unremarkable Neck:  6 cm JVD, no thyromegally Lymphatics:  No adenopathy Back:  No CVA tenderness Lungs:  Clear with no wheezes, rales, or rhonchi. HEART:  Regular rate rhythm, no murmurs, no rubs, no clicks Abd:  soft, positive bowel sounds, no organomegally, no rebound, no guarding Ext:  2 plus pulses, no cyanosis, no clubbing, left arm with minimal swelling Skin:  No rashes no nodules Neuro:  CN II through XII intact, motor grossly intact  PM interogation - atrial lead does not capture (likely atrial fib) but also does not sense.   Assess/Plan:  1. PPM - His device interogation demonstrates that his atrial lead is not working. He will need to undergo lead revision. 2. Left arm swelling - This has resolved. 3. HTN - his blood pressure is well controlled. Will follow.  4. Probable atrial fib - he will start Eliquis. His rate is slow.  5. Acute on chronic systolic heart failure - he has developed worsening LV dysfunction. He will need his RV lead placed in the His bundle region or have a LV lead placed to treat his CHF. He will also need return to NSR. Will consider cardioversion when he returns to have his lead revision.  Mikle Bosworth.D.

## 2015-11-25 NOTE — Patient Instructions (Addendum)
  Your physician has recommended you make the following change in your medication: Start Eliquis 5 mg Take Tablet Two Times Daily.  You May Take Extra Lasix On days the you Shortness of Breath.   If you need a refill on your cardiac medications before your next appointment, please call your pharmacy.  Thank you for choosing [facility]!

## 2015-11-29 ENCOUNTER — Ambulatory Visit (INDEPENDENT_AMBULATORY_CARE_PROVIDER_SITE_OTHER): Payer: Commercial Managed Care - HMO | Admitting: Family Medicine

## 2015-11-29 ENCOUNTER — Encounter: Payer: Self-pay | Admitting: Family Medicine

## 2015-11-29 VITALS — BP 118/74 | Ht 70.0 in | Wt 242.0 lb

## 2015-11-29 DIAGNOSIS — Z125 Encounter for screening for malignant neoplasm of prostate: Secondary | ICD-10-CM

## 2015-11-29 DIAGNOSIS — I10 Essential (primary) hypertension: Secondary | ICD-10-CM | POA: Diagnosis not present

## 2015-11-29 DIAGNOSIS — Z23 Encounter for immunization: Secondary | ICD-10-CM

## 2015-11-29 DIAGNOSIS — Z0000 Encounter for general adult medical examination without abnormal findings: Secondary | ICD-10-CM | POA: Diagnosis not present

## 2015-11-29 NOTE — Progress Notes (Signed)
   Subjective:    Patient ID: John [name], male    DOB: 10-Jun-1945, 70 y.o.   MRN: [id number]  HPI AWV- Annual Wellness Visit  The patient was seen for their annual wellness visit. The patient's past medical history, surgical history, and family history were reviewed. Pertinent vaccines were reviewed ( tetanus, pneumonia, shingles, flu) The patient's medication list was reviewed and updated.  The height and weight were entered. The patient's current BMI is: 34.72  Cognitive screening was completed. Outcome of Mini - Cog: pass  Falls within the past 6 months: none  Current tobacco usage: none (All patients who use tobacco were given written and verbal information on quitting)  Recent listing of emergency department/hospitalizations over the past year were reviewed.  current specialist the patient sees on a regular basis: Dr. Lovena Le - cardiologist, top wire has come loose.  Blood pressure medicine and blood pressure levels reviewed today with patient. Compliant with blood pressure medicine. States does not miss a dose. No obvious side effects. Blood pressure generally good when checked elsewhere. Watching salt intake.  Prior hospitalization reviewed. Patient was switched to Lasix due to a flare of congestive heart failure. Was advised have met 7. Review blood work reveals that she is he has not had a PSA for a urinary have. Also he's doing pneumonia vaccine. In addition had a colonoscopy 5 years ago, we went back to research Dr. Tera Helper recommended in 10 years so 5 more years left   Dr. Jeanann Lewandowsky Diabetes   Medicare annual wellness visit patient questionnaire was reviewed.  A written screening schedule for the patient for the next 5-10 years was given. Appropriate discussion of followup regarding next visit was discussed.  Blood pressure medicine and blood pressure levels reviewed today with patient. Compliant with blood pressure medicine. States does not miss a dose. No  obvious side effects. Blood pressure generally good when checked elsewhere. Watching salt intake.         Review of Systems  Constitutional: Negative.   All other systems reviewed and are negative.      Objective:   Physical Exam  Constitutional: He appears well-developed and well-nourished.  HENT:  Head: Normocephalic and atraumatic.  Right Ear: External ear normal.  Left Ear: External ear normal.  Nose: Nose normal.  Mouth/Throat: Oropharynx is clear and moist.  Eyes: EOM are normal. Pupils are equal, round, and reactive to light.  Neck: Normal range of motion. Neck supple. No thyromegaly present.  Cardiovascular: Normal rate, regular rhythm and normal heart sounds.   No murmur heard. Pulmonary/Chest: Effort normal and breath sounds normal. No respiratory distress. He has no wheezes.  Abdominal: Soft. Bowel sounds are normal. He exhibits no distension and no mass. There is no tenderness.  Genitourinary: Penis normal.  Musculoskeletal: Normal range of motion. He exhibits no edema.  Lymphadenopathy:    He has no cervical adenopathy.  Neurological: He is alert. He exhibits normal muscle tone.  Skin: Skin is warm and dry. No erythema.  Psychiatric: He has a normal mood and affect. His behavior is normal. Judgment normal.  Vitals reviewed.         Assessment & Plan:   impression 1 wellness exam. Up-to-date on colonoscopy 5 more years. Pneumonia shot today. Flu sht today. Appropriate blood work. Diet exercise discussed #2 hypertension decent control on current meds maintain same compliance discussed #3 status post congestive heart failure

## 2015-12-05 ENCOUNTER — Encounter: Payer: Commercial Managed Care - HMO | Admitting: Internal Medicine

## 2015-12-05 DIAGNOSIS — I10 Essential (primary) hypertension: Secondary | ICD-10-CM | POA: Diagnosis not present

## 2015-12-05 DIAGNOSIS — Z125 Encounter for screening for malignant neoplasm of prostate: Secondary | ICD-10-CM | POA: Diagnosis not present

## 2015-12-05 DIAGNOSIS — Z0000 Encounter for general adult medical examination without abnormal findings: Secondary | ICD-10-CM | POA: Diagnosis not present

## 2015-12-06 DIAGNOSIS — E11319 Type 2 diabetes mellitus with unspecified diabetic retinopathy without macular edema: Secondary | ICD-10-CM | POA: Diagnosis not present

## 2015-12-06 LAB — BASIC METABOLIC PANEL
BUN / CREAT RATIO: 19 (ref 10–24)
BUN: 22 mg/dL (ref 8–27)
CO2: 20 mmol/L (ref 18–29)
CREATININE: 1.17 mg/dL (ref 0.76–1.27)
Calcium: 9.6 mg/dL (ref 8.6–10.2)
Chloride: 99 mmol/L (ref 96–106)
GFR calc Af Amer: 73 mL/min/{1.73_m2} (ref >59)
GFR calc non Af Amer: 63 mL/min/{1.73_m2} (ref >59)
GLUCOSE: 190 mg/dL — AB (ref 65–99)
Potassium: 4.6 mmol/L (ref 3.5–5.2)
SODIUM: 138 mmol/L (ref 134–144)

## 2015-12-06 LAB — PSA: Prostate Specific Ag, Serum: 1.6 ng/mL (ref 0.0–4.0)

## 2015-12-06 LAB — HM DIABETES EYE EXAM

## 2015-12-07 ENCOUNTER — Ambulatory Visit (HOSPITAL_COMMUNITY)
Admission: RE | Admit: 2015-12-07 | Discharge: 2015-12-08 | Disposition: A | Discharge status: Home / Self Care | Payer: Commercial Managed Care - HMO | Source: Ambulatory Visit | Attending: Internal Medicine | Admitting: Internal Medicine

## 2015-12-07 ENCOUNTER — Encounter (HOSPITAL_COMMUNITY)
Admission: RE | Disposition: A | Discharge status: Home / Self Care | Payer: Self-pay | Source: Ambulatory Visit | Attending: Internal Medicine

## 2015-12-07 DIAGNOSIS — T82120A Displacement of cardiac electrode, initial encounter: Secondary | ICD-10-CM | POA: Diagnosis not present

## 2015-12-07 DIAGNOSIS — I110 Hypertensive heart disease with heart failure: Secondary | ICD-10-CM | POA: Diagnosis not present

## 2015-12-07 DIAGNOSIS — Z87891 Personal history of nicotine dependence: Secondary | ICD-10-CM | POA: Insufficient documentation

## 2015-12-07 DIAGNOSIS — I442 Atrioventricular block, complete: Secondary | ICD-10-CM | POA: Insufficient documentation

## 2015-12-07 DIAGNOSIS — E785 Hyperlipidemia, unspecified: Secondary | ICD-10-CM | POA: Insufficient documentation

## 2015-12-07 DIAGNOSIS — I481 Persistent atrial fibrillation: Secondary | ICD-10-CM | POA: Insufficient documentation

## 2015-12-07 DIAGNOSIS — I5042 Chronic combined systolic (congestive) and diastolic (congestive) heart failure: Secondary | ICD-10-CM | POA: Insufficient documentation

## 2015-12-07 DIAGNOSIS — E119 Type 2 diabetes mellitus without complications: Secondary | ICD-10-CM | POA: Diagnosis not present

## 2015-12-07 DIAGNOSIS — Y713 Surgical instruments, materials and cardiovascular devices (including sutures) associated with adverse incidents: Secondary | ICD-10-CM | POA: Insufficient documentation

## 2015-12-07 DIAGNOSIS — I471 Supraventricular tachycardia: Secondary | ICD-10-CM | POA: Insufficient documentation

## 2015-12-07 DIAGNOSIS — M1990 Unspecified osteoarthritis, unspecified site: Secondary | ICD-10-CM | POA: Diagnosis not present

## 2015-12-07 DIAGNOSIS — Z8249 Family history of ischemic heart disease and other diseases of the circulatory system: Secondary | ICD-10-CM | POA: Insufficient documentation

## 2015-12-07 DIAGNOSIS — Z950 Presence of cardiac pacemaker: Secondary | ICD-10-CM

## 2015-12-07 HISTORY — PX: EP IMPLANTABLE DEVICE: SHX172B

## 2015-12-07 LAB — CBC
HCT: 44.3 % (ref 39.0–52.0)
Hemoglobin: 14.5 g/dL (ref 13.0–17.0)
MCH: 26.9 pg (ref 26.0–34.0)
MCHC: 32.7 g/dL (ref 30.0–36.0)
MCV: 82 fL (ref 78.0–100.0)
PLATELETS: 179 10*3/uL (ref 150–400)
RBC: 5.4 MIL/uL (ref 4.22–5.81)
RDW: 16.3 % — AB (ref 11.5–15.5)
WBC: 9.4 10*3/uL (ref 4.0–10.5)

## 2015-12-07 LAB — GLUCOSE, CAPILLARY
GLUCOSE-CAPILLARY: 70 mg/dL (ref 65–99)
Glucose-Capillary: 129 mg/dL — ABNORMAL HIGH (ref 65–99)
Glucose-Capillary: 172 mg/dL — ABNORMAL HIGH (ref 65–99)

## 2015-12-07 LAB — SURGICAL PCR SCREEN
MRSA, PCR: NEGATIVE
STAPHYLOCOCCUS AUREUS: NEGATIVE

## 2015-12-07 SURGERY — LEAD REVISION/REPAIR
Anesthesia: LOCAL

## 2015-12-07 MED ORDER — ONDANSETRON HCL 4 MG/2ML IJ SOLN: 4.0000 mg | Freq: Four times a day (QID) | INTRAMUSCULAR | Status: DC | PRN

## 2015-12-07 MED ORDER — LIDOCAINE HCL (PF) 1 % IJ SOLN
INTRAMUSCULAR | Status: AC
  Filled 2015-12-07: qty 60

## 2015-12-07 MED ORDER — CEFAZOLIN SODIUM-DEXTROSE 2-4 GM/100ML-% IV SOLN
INTRAVENOUS | Status: AC
Start: 2015-12-07 — End: 2015-12-07
  Filled 2015-12-07: qty 100

## 2015-12-07 MED ORDER — MUPIROCIN 2 % EX OINT
TOPICAL_OINTMENT | CUTANEOUS | Status: AC
  Administered 2015-12-07: 1 via TOPICAL
  Filled 2015-12-07: qty 22

## 2015-12-07 MED ORDER — ROSUVASTATIN CALCIUM 10 MG PO TABS
20.0000 mg | ORAL_TABLET | Freq: Every day | ORAL | Status: DC
  Administered 2015-12-07 – 2015-12-08 (×2): 20 mg via ORAL
  Filled 2015-12-07 (×2): qty 2

## 2015-12-07 MED ORDER — SODIUM CHLORIDE 0.9 % IR SOLN
Status: AC
  Filled 2015-12-07: qty 2

## 2015-12-07 MED ORDER — FENTANYL CITRATE (PF) 100 MCG/2ML IJ SOLN
INTRAMUSCULAR | Status: AC
Start: 2015-12-07 — End: 2015-12-07
  Filled 2015-12-07: qty 2

## 2015-12-07 MED ORDER — INSULIN ASPART 100 UNIT/ML ~~LOC~~ SOLN: 0.0000 [IU] | Freq: Three times a day (TID) | SUBCUTANEOUS | Status: DC

## 2015-12-07 MED ORDER — CHLORHEXIDINE GLUCONATE 4 % EX LIQD: 60.0000 mL | Freq: Once | CUTANEOUS | Status: DC

## 2015-12-07 MED ORDER — FENTANYL CITRATE (PF) 100 MCG/2ML IJ SOLN
INTRAMUSCULAR | Status: DC | PRN
  Administered 2015-12-07: 12.5 ug via INTRAVENOUS
  Administered 2015-12-07: 25 ug via INTRAVENOUS
  Administered 2015-12-07 (×2): 12.5 ug via INTRAVENOUS

## 2015-12-07 MED ORDER — CARVEDILOL 25 MG PO TABS
25.0000 mg | ORAL_TABLET | Freq: Two times a day (BID) | ORAL | Status: DC
  Administered 2015-12-08: 25 mg via ORAL
  Filled 2015-12-07: qty 1

## 2015-12-07 MED ORDER — MIDAZOLAM HCL 5 MG/5ML IJ SOLN
INTRAMUSCULAR | Status: DC | PRN
  Administered 2015-12-07 (×5): 1 mg via INTRAVENOUS

## 2015-12-07 MED ORDER — BENAZEPRIL HCL 40 MG PO TABS
40.0000 mg | ORAL_TABLET | Freq: Every day | ORAL | Status: DC
  Administered 2015-12-08: 40 mg via ORAL
  Filled 2015-12-07: qty 1

## 2015-12-07 MED ORDER — LINAGLIPTIN 5 MG PO TABS
5.0000 mg | ORAL_TABLET | Freq: Every day | ORAL | Status: DC
  Administered 2015-12-07 – 2015-12-08 (×2): 5 mg via ORAL
  Filled 2015-12-07 (×2): qty 1

## 2015-12-07 MED ORDER — SODIUM CHLORIDE 0.9 % IV SOLN
INTRAVENOUS | Status: DC
  Administered 2015-12-07: 13:00:00 via INTRAVENOUS

## 2015-12-07 MED ORDER — SITAGLIPTIN PHOS-METFORMIN HCL 50-1000 MG PO TABS: 1.0000 | ORAL_TABLET | Freq: Two times a day (BID) | ORAL | Status: DC

## 2015-12-07 MED ORDER — CEFAZOLIN SODIUM-DEXTROSE 2-4 GM/100ML-% IV SOLN
2.0000 g | INTRAVENOUS | Status: AC
  Administered 2015-12-07: 2 g via INTRAVENOUS

## 2015-12-07 MED ORDER — MUPIROCIN 2 % EX OINT
1.0000 "application " | TOPICAL_OINTMENT | Freq: Once | CUTANEOUS | Status: AC
  Administered 2015-12-07: 1 via TOPICAL

## 2015-12-07 MED ORDER — ACETAMINOPHEN 325 MG PO TABS
325.0000 mg | ORAL_TABLET | ORAL | Status: DC | PRN
Start: 2015-12-07 — End: 2015-12-08

## 2015-12-07 MED ORDER — ADULT MULTIVITAMIN W/MINERALS CH
1.0000 | ORAL_TABLET | Freq: Every day | ORAL | Status: DC
  Administered 2015-12-08: 1 via ORAL
  Filled 2015-12-07 (×2): qty 1

## 2015-12-07 MED ORDER — TAMSULOSIN HCL 0.4 MG PO CAPS
0.4000 mg | ORAL_CAPSULE | Freq: Every day | ORAL | Status: DC
  Administered 2015-12-07: 0.4 mg via ORAL
  Filled 2015-12-07: qty 1

## 2015-12-07 MED ORDER — RESVERATROL 100 MG PO CAPS: 100.0000 mg | ORAL_CAPSULE | Freq: Every day | ORAL | Status: DC

## 2015-12-07 MED ORDER — INSULIN GLARGINE 100 UNIT/ML ~~LOC~~ SOLN
45.0000 [IU] | Freq: Every day | SUBCUTANEOUS | Status: DC
  Administered 2015-12-08: 45 [IU] via SUBCUTANEOUS
  Filled 2015-12-07: qty 0.45

## 2015-12-07 MED ORDER — MIDAZOLAM HCL 5 MG/5ML IJ SOLN
INTRAMUSCULAR | Status: AC
  Filled 2015-12-07: qty 5

## 2015-12-07 MED ORDER — FUROSEMIDE 40 MG PO TABS
40.0000 mg | ORAL_TABLET | Freq: Every day | ORAL | Status: DC
  Administered 2015-12-08: 40 mg via ORAL
  Filled 2015-12-07: qty 1

## 2015-12-07 MED ORDER — BENZONATATE 100 MG PO CAPS
200.0000 mg | ORAL_CAPSULE | Freq: Three times a day (TID) | ORAL | Status: DC | PRN
Start: 2015-12-07 — End: 2015-12-08

## 2015-12-07 MED ORDER — INSULIN ASPART 100 UNIT/ML ~~LOC~~ SOLN: 0.0000 [IU] | Freq: Every day | SUBCUTANEOUS | Status: DC

## 2015-12-07 MED ORDER — SODIUM CHLORIDE 0.9 % IR SOLN
80.0000 mg | Status: AC
  Administered 2015-12-07: 80 mg

## 2015-12-07 MED ORDER — CEFAZOLIN SODIUM-DEXTROSE 2-4 GM/100ML-% IV SOLN
2.0000 g | Freq: Four times a day (QID) | INTRAVENOUS | Status: AC
Start: 2015-12-07 — End: 2015-12-08
  Administered 2015-12-07 – 2015-12-08 (×3): 2 g via INTRAVENOUS
  Filled 2015-12-07 (×3): qty 100

## 2015-12-07 MED ORDER — AMLODIPINE BESYLATE 10 MG PO TABS
10.0000 mg | ORAL_TABLET | Freq: Every day | ORAL | Status: DC
  Administered 2015-12-08: 10 mg via ORAL
  Filled 2015-12-07: qty 1

## 2015-12-07 MED ORDER — IOPAMIDOL (ISOVUE-370) INJECTION 76%
INTRAVENOUS | Status: DC | PRN
  Administered 2015-12-07: 10 mL via INTRAVENOUS

## 2015-12-07 MED ORDER — LIDOCAINE HCL (PF) 1 % IJ SOLN
INTRAMUSCULAR | Status: DC | PRN
  Administered 2015-12-07: 40 mL via INTRADERMAL

## 2015-12-07 MED ORDER — HEPARIN (PORCINE) IN NACL 2-0.9 UNIT/ML-% IJ SOLN
INTRAMUSCULAR | Status: AC
  Filled 2015-12-07: qty 500

## 2015-12-07 MED ORDER — POTASSIUM CHLORIDE 20 MEQ PO PACK
20.0000 meq | PACK | Freq: Every day | ORAL | Status: DC
  Administered 2015-12-08: 20 meq via ORAL
  Filled 2015-12-07: qty 1

## 2015-12-07 MED ORDER — METFORMIN HCL 500 MG PO TABS
1000.0000 mg | ORAL_TABLET | Freq: Two times a day (BID) | ORAL | Status: DC
  Administered 2015-12-08: 1000 mg via ORAL
  Filled 2015-12-07: qty 2

## 2015-12-07 SURGICAL SUPPLY — 7 items
CABLE SURGICAL S-101-97-12 (CABLE) ×4 IMPLANT
LEAD CAPSURE NOVUS 5076-52CM (Lead) ×2 IMPLANT
LEAD CAPSURE NOVUS 5076-58CM (Lead) ×2 IMPLANT
PAD DEFIB LIFELINK (PAD) ×2 IMPLANT
SET INTRODUCER MICROPUNCT 5F (INTRODUCER) ×4 IMPLANT
SHEATH CLASSIC 7F (SHEATH) ×4 IMPLANT
TRAY PACEMAKER INSERTION (PACKS) ×2 IMPLANT

## 2015-12-07 NOTE — H&P (View-Only) (Signed)
HPI Mr. Carrera returns today for followup. He is a pleasant 70 yo man who was admitted with CHF and atrial flutter remotely who underwent catheter ablation years ago. He underwent PPM insertion 3 months ago. He was noted to have elevated atrial threshold and returns today for followup. He was in the hospital with fluid overload. He was diuresed. A cXR demonstrated that his atrial lead had dislodged. His left arm swelling has resolved.   No Known Allergies   Current Outpatient Prescriptions  Medication Sig Dispense Refill  . amLODipine (NORVASC) 10 MG tablet Take 1 tablet (10 mg total) by mouth daily. 90 tablet 1  . benazepril (LOTENSIN) 40 MG tablet Take 1 tablet (40 mg total) by mouth daily. 90 tablet 1  . benzonatate (TESSALON) 200 MG capsule Take 1 capsule (200 mg total) by mouth 3 (three) times daily as needed for cough. 60 capsule 0  . carvedilol (COREG) 25 MG tablet Take 1 tablet (25 mg total) by mouth 2 (two) times daily with a meal. 180 tablet 3  . furosemide (LASIX) 40 MG tablet Take 1 tablet (40 mg total) by mouth daily. 30 tablet 0  . Insulin Glargine (TOUJEO SOLOSTAR) 300 UNIT/ML SOPN Inject 45 Units into the skin daily.    . Magnesium 400 MG CAPS Take by mouth.    . Multiple Vitamins-Minerals (MULTIVITAMIN WITH MINERALS) tablet Take 1 tablet by mouth daily.    . Omega-3 Fatty Acids (FISH OIL) 1000 MG CAPS Take 1 capsule by mouth daily.    . potassium chloride 20 MEQ TBCR Take 20 mEq by mouth daily. 30 tablet 0  . Resveratrol 100 MG CAPS Take 1 capsule by mouth daily.    . rosuvastatin (CRESTOR) 20 MG tablet Take 1 tablet (20 mg total) by mouth daily. 90 tablet 1  . sitaGLIPtan-metformin (JANUMET) 50-1000 MG per tablet Take 1 tablet by mouth 2 (two) times daily with a meal.      . tadalafil (CIALIS) 20 MG tablet Take 1 tablet (20 mg total) by mouth daily as needed. For erectile dysfunction 10 tablet 6  . tamsulosin (FLOMAX) 0.4 MG CAPS capsule Take 1 capsule (0.4 mg total) by mouth  at bedtime. 90 capsule 1   No current facility-administered medications for this visit.      Past Medical History:  Diagnosis Date  . Atrial flutter ([place]) 12/2010   Admitted with symptomatic bradycardia (HR 40s), atrial flutter with slow ventricular response 12/2010 + volume overload; AV nodal agents d/c'd and Pradaxa started; RFA in 01/2011  . CHF (congestive heart failure) ([place])   . Chronic combined systolic and diastolic heart failure (HCC)    echo 01/06/11: mild LVH, EF 65-70%, mod to severe LAE, mild RVE, mild RAE, PASP 32;   TEE 10/12: EF 45-50%   . Diabetes mellitus    non insulin dependant  . Hyperlipidemia   . Hypertension   . Osteoarthritis   . PSVT (paroxysmal supraventricular tachycardia) (HCC)    Possibly atrial flutter    ROS:   All systems reviewed and negative except as noted in the HPI.   Past Surgical History:  Procedure Laterality Date  . ATRIAL FLUTTER ABLATION N/A 02/07/2011   Procedure: ATRIAL FLUTTER ABLATION;  Surgeon: Evans Lance, MD;  Location: [facility] CATH LAB;  Service: Cardiovascular;  Laterality: N/A;  . CARDIAC ELECTROPHYSIOLOGY STUDY AND ABLATION  02/07/11  . EP IMPLANTABLE DEVICE N/A 08/29/2015   Procedure: Pacemaker Implant;  Surgeon: Evans Lance, MD;  Location: [place]  CV LAB;  Service: Cardiovascular;  Laterality: N/A;  . KNEE ARTHROSCOPY  2005   left  . LUMBAR SPINE SURGERY     "I've had 6 ORs 1972 thru 2004"     Family History  Problem Relation Age of Onset  . Heart attack Mother   . Hypertension Mother   . Heart attack Father   . Hypertension Father   . Heart attack Brother      Social History   Social History  . Marital status: Married    Spouse name: N/A  . Number of children: N/A  . Years of education: N/A   Occupational History  . Designer, industrial/product Retired   Social History Main Topics  . Smoking status: Former Smoker    Packs/day: 1.00    Years: 10.00    Types: Cigarettes    Quit date: 03/26/1986  .  Smokeless tobacco: Never Used     Comment: "stopped cigarette  smoking 1988"  . Alcohol use 0.0 oz/week     Comment: "quit alcohol ~ 2007"  . Drug use: No  . Sexual activity: Yes    Partners: Female   Other Topics Concern  . Not on file   Social History Narrative  . No narrative on file     Pulse 86   Ht 5' 11"  (1.803 m)   Wt 246 lb (111.6 kg)   SpO2 98%   BMI 34.31 kg/m   Physical Exam:  Well appearing 70 yo man, NAD HEENT: Unremarkable Neck:  6 cm JVD, no thyromegally Lymphatics:  No adenopathy Back:  No CVA tenderness Lungs:  Clear with no wheezes, rales, or rhonchi. HEART:  Regular rate rhythm, no murmurs, no rubs, no clicks Abd:  soft, positive bowel sounds, no organomegally, no rebound, no guarding Ext:  2 plus pulses, no cyanosis, no clubbing, left arm with minimal swelling Skin:  No rashes no nodules Neuro:  CN II through XII intact, motor grossly intact  PM interogation - atrial lead does not capture (likely atrial fib) but also does not sense.   Assess/Plan:  1. PPM - His device interogation demonstrates that his atrial lead is not working. He will need to undergo lead revision. 2. Left arm swelling - This has resolved. 3. HTN - his blood pressure is well controlled. Will follow.  4. Probable atrial fib - he will start Eliquis. His rate is slow.  5. Acute on chronic systolic heart failure - he has developed worsening LV dysfunction. He will need his RV lead placed in the His bundle region or have a LV lead placed to treat his CHF. He will also need return to NSR. Will consider cardioversion when he returns to have his lead revision.  Mikle Bosworth.D.

## 2015-12-07 NOTE — Interval H&P Note (Signed)
History and Physical Interval Note:  12/07/2015 2:46 PM  John [name]  has presented today for surgery, with the diagnosis of lead dysfunction  The various methods of treatment have been discussed with the patient and family. After consideration of risks, benefits and other options for treatment, the patient has consented to  Procedure(s): PPM Lead Revision/Repair (N/A) as a surgical intervention .  The patient's history has been reviewed, patient examined, no change in status, stable for surgery.  I have reviewed the patient's chart and labs.  Questions were answered to the patient's satisfaction.     [name]

## 2015-12-07 NOTE — Progress Notes (Signed)
Patient arrived to room 2W13 from the cath lab in no apparent distress.  Patient was oriented to room to include call light and phone.  Telemetry monitor was applied and CCMD notified.  Patient has sling on left arm status post pacemaker lead replacement and is instructed on bed rest and limb movement restrictions.  Will continue to monitor.

## 2015-12-08 ENCOUNTER — Ambulatory Visit (HOSPITAL_COMMUNITY): Payer: Commercial Managed Care - HMO

## 2015-12-08 ENCOUNTER — Encounter (HOSPITAL_COMMUNITY): Payer: Self-pay | Admitting: Internal Medicine

## 2015-12-08 DIAGNOSIS — M1990 Unspecified osteoarthritis, unspecified site: Secondary | ICD-10-CM | POA: Diagnosis not present

## 2015-12-08 DIAGNOSIS — I5042 Chronic combined systolic (congestive) and diastolic (congestive) heart failure: Secondary | ICD-10-CM | POA: Diagnosis not present

## 2015-12-08 DIAGNOSIS — J449 Chronic obstructive pulmonary disease, unspecified: Secondary | ICD-10-CM | POA: Diagnosis not present

## 2015-12-08 DIAGNOSIS — T82120A Displacement of cardiac electrode, initial encounter: Secondary | ICD-10-CM | POA: Diagnosis not present

## 2015-12-08 DIAGNOSIS — I442 Atrioventricular block, complete: Secondary | ICD-10-CM | POA: Diagnosis not present

## 2015-12-08 DIAGNOSIS — T82120D Displacement of cardiac electrode, subsequent encounter: Secondary | ICD-10-CM

## 2015-12-08 DIAGNOSIS — I471 Supraventricular tachycardia: Secondary | ICD-10-CM | POA: Diagnosis not present

## 2015-12-08 DIAGNOSIS — E119 Type 2 diabetes mellitus without complications: Secondary | ICD-10-CM | POA: Diagnosis not present

## 2015-12-08 DIAGNOSIS — I481 Persistent atrial fibrillation: Secondary | ICD-10-CM | POA: Diagnosis not present

## 2015-12-08 DIAGNOSIS — E785 Hyperlipidemia, unspecified: Secondary | ICD-10-CM | POA: Diagnosis not present

## 2015-12-08 DIAGNOSIS — I110 Hypertensive heart disease with heart failure: Secondary | ICD-10-CM | POA: Diagnosis not present

## 2015-12-08 LAB — GLUCOSE, CAPILLARY
GLUCOSE-CAPILLARY: 107 mg/dL — AB (ref 65–99)
Glucose-Capillary: 138 mg/dL — ABNORMAL HIGH (ref 65–99)

## 2015-12-08 MED ORDER — ASPIRIN EC 81 MG PO TBEC: 81.0000 mg | DELAYED_RELEASE_TABLET | Freq: Every day | ORAL | Status: DC

## 2015-12-08 MED FILL — Sodium Chloride Irrigation Soln 0.9%: Qty: 500 | Status: AC

## 2015-12-08 MED FILL — Heparin Sodium (Porcine) 2 Unit/ML in Sodium Chloride 0.9%: INTRAMUSCULAR | Qty: 500 | Status: AC

## 2015-12-08 MED FILL — Gentamicin Sulfate Inj 40 MG/ML: INTRAMUSCULAR | Qty: 2 | Status: AC

## 2015-12-08 NOTE — Discharge Summary (Signed)
ELECTROPHYSIOLOGY PROCEDURE DISCHARGE SUMMARY    Patient ID: John [name],  MRN: [id number], DOB/AGE: August 26, 1945 70 y.o.  Admit date: 12/07/2015 Discharge date: 12/08/2015  Primary Care Physician: Mickie Hillier, MD  Primary Cardiologist/Electrophysiologist: Dr. Lovena Le  Primary Discharge Diagnosis:  1. PPM atrial lead dislodgement  Secondary Discharge Diagnosis:  1. Persistent Afib     Hx of Aflutter ablation     CHA2DS2Vasc is at least 4, on Eliquis 2. HTN 3. CHF, systolic     Compensated 4. DM  No Known Allergies   Procedures This Admission:  1.  Removal of PPM atrial lead with new Medtronic model 5 09/29/1950 centimeter active fixation pacing lead, serial number [id number], RA lead Intra-procedure complications: The procedure was complicated by inadvertent puncture of the ventricular lead insulation requiring removal of the ventricular lead insertion of a new ventricular lead, Medtronic 5076, 58 centimeter active fixation pacing lead, serial number [id number] .  There were no immediate post procedure complications. 2.  CXR on 12/08/15 demonstrated no pneumothorax status post device implantation.   Brief HPI: John [name] is a 70 y.o. male was referred to electrophysiology in the outpatient setting noting his RA lead had dislodged and planned for RA lead revision.  Past medical history includes persistent AFib, HTN, HLD, DM, symptomatic bradycardia with PPM.   Risks, benefits, and alternatives to PPM implantation were reviewed with the patient who wished to proceed.   Hospital Course:  The patient was admitted and underwent removal of PPM RA and RV leads with new RA and RV lead implantation.  He was monitored on telemetry overnight which demonstrated AF with V pacing.  Left chest was without hematoma or ecchymosis.  The device was interrogated and found to be functioning normally.  CXR was obtained and demonstrated no pneumothorax status post device implantation.   Wound care, arm mobility, and restrictions were reviewed with the patient.   We will hold his Eliquis and ASA until Monday 12/12/15.  Home med list does not include ASA, but the patient is/has been in-fact taking ECASA 39m daily, this will be added to his list.  he patient was examined by Dr. TLovena Leand considered stable for discharge to home.    Physical Exam: Vitals:   12/07/15 2022 12/07/15 2200 12/08/15 0000 12/08/15 0444  BP: 130/66 138/69 128/63 (!) 143/88  Pulse: 61 65 68 69  Resp: 18   20  Temp: 97.7 F (36.5 C)   98.8 F (37.1 C)  TempSrc: Oral   Oral  SpO2: 98%   100%  Weight:      Height:        GEN- The patient is well appearing, alert and oriented x 3 today.   HEENT: normocephalic, atraumatic; sclera clear, conjunctiva pink; hearing intact; oropharynx clear; neck supple, no JVP Lungs- Clear to ausculation bilaterally, normal work of breathing.  No wheezes, rales, rhonchi Heart- RRR, no murmurs, rubs or gallops, PMI not laterally displaced GI- soft, non-tender, non-distended Extremities- no clubbing, cyanosis, or edema MS- no significant deformity or atrophy Skin- warm and dry, no rash or lesion, left chest has minimal swelling, no hematoma/ecchymosis Psych- euthymic mood, full affect Neuro- no gross deficits   Labs:   Lab Results  Component Value Date   WBC 9.4 12/07/2015   HGB 14.5 12/07/2015   HCT 44.3 12/07/2015   MCV 82.0 12/07/2015   PLT 179 12/07/2015     Recent Labs Lab 12/05/15 0838  NA 138  K 4.6  CL  99  CO2 20  BUN 22  CREATININE 1.17  CALCIUM 9.6  GLUCOSE 190*    Discharge Medications:    Medication List    TAKE these medications   amLODipine 10 MG tablet Commonly known as:  NORVASC Take 1 tablet (10 mg total) by mouth daily.   aspirin EC 81 MG tablet Take 1 tablet (81 mg total) by mouth daily. Notes to patient:  Do not resume until Monday 12/12/15   benazepril 40 MG tablet Commonly known as:  LOTENSIN Take 1 tablet (40 mg  total) by mouth daily.   benzonatate 200 MG capsule Commonly known as:  TESSALON Take 1 capsule (200 mg total) by mouth 3 (three) times daily as needed for cough.   carvedilol 25 MG tablet Commonly known as:  COREG Take 1 tablet (25 mg total) by mouth 2 (two) times daily with a meal.   ELIQUIS 5 MG Tabs tablet Generic drug:  apixaban Take 5 mg by mouth 2 (two) times daily. Notes to patient:  Do not resume until Monday 12/12/15   Fish Oil 1000 MG Caps Take 1 capsule by mouth daily.   furosemide 40 MG tablet Commonly known as:  LASIX Take 1 tablet (40 mg total) by mouth daily. What changed:  when to take this   Magnesium 400 MG Caps Take by mouth.   multivitamin with minerals tablet Take 1 tablet by mouth daily.   Potassium Chloride ER 20 MEQ Tbcr Take 20 mEq by mouth daily.   Resveratrol 100 MG Caps Take 100 mg by mouth daily.   rosuvastatin 20 MG tablet Commonly known as:  CRESTOR Take 1 tablet (20 mg total) by mouth daily.   sitaGLIPtin-metformin 50-1000 MG tablet Commonly known as:  JANUMET Take 1 tablet by mouth 2 (two) times daily with a meal.   tadalafil 20 MG tablet Commonly known as:  CIALIS Take 1 tablet (20 mg total) by mouth daily as needed. For erectile dysfunction What changed:  reasons to take this  additional instructions   tamsulosin 0.4 MG Caps capsule Commonly known as:  FLOMAX Take 1 capsule (0.4 mg total) by mouth at bedtime.   TOUJEO SOLOSTAR 300 UNIT/ML Sopn Generic drug:  Insulin Glargine Inject 45 Units into the skin daily.       Disposition:  Home Discharge Instructions    Diet - low sodium heart healthy    Complete by:  As directed    Increase activity slowly    Complete by:  As directed      Follow-up Information    [place] [facility] Follow up on 12/19/2015.   Specialty:  Cardiology Why:  12:00PM (noon), wound check Contact information: [address]  [place]       [name], MD Follow up on 03/07/2016.   Specialty:  Cardiology Why:  9/45AM Contact information: [address] [address] [phone]           Duration of Discharge Encounter: Greater than 30 minutes including physician time.  Venetia Night, PA-C 12/08/2015 8:57 AM  EP Attending  Patient seen and examined. Agree with above. He is doing well after pacemaker lead revision. [place] for DC. He has a small pocket hematoma and will hold off on Elliquis until 9/18. Usual followup.  Mikle Bosworth.D.

## 2015-12-08 NOTE — Discharge Instructions (Signed)
° ° °  Supplemental Discharge Instructions for  Pacemaker/Defibrillator Patients  Activity No heavy lifting or vigorous activity with your left/right arm for 6 to 8 weeks.  Do not raise your left/right arm above your head for one week.  Gradually raise your affected arm as drawn below.              12/11/15                    12/12/15                    12/13/15                   12/14/15 __  NO DRIVING for 1 week    ; you may begin driving on  0/98/11   .  WOUND CARE - Keep the wound area clean and dry.  Do not get this area wet for one week. No showers for one week; you may shower on   12/14/15  . - The tape/steri-strips on your wound will fall off; do not pull them off.  No bandage is needed on the site.  DO  NOT apply any creams, oils, or ointments to the wound area. - If you notice any drainage or discharge from the wound, any swelling or bruising at the site, or you develop a fever > 101? F after you are discharged home, call the office at once.  Special Instructions - You are still able to use cellular telephones; use the ear opposite the side where you have your pacemaker/defibrillator.  Avoid carrying your cellular phone near your device. - When traveling through airports, show security personnel your identification card to avoid being screened in the metal detectors.  Ask the security personnel to use the hand wand. - Avoid arc welding equipment, MRI testing (magnetic resonance imaging), TENS units (transcutaneous nerve stimulators).  Call the office for questions about other devices. - Avoid electrical appliances that are in poor condition or are not properly grounded. - Microwave ovens are safe to be near or to operate.  Additional information for defibrillator patients should your device go off: - If your device goes off ONCE and you feel fine afterward, notify the device clinic nurses. - If your device goes off ONCE and you do not feel well afterward, call 911. - If your device  goes off TWICE, call 911. - If your device goes off THREE times in one day, call 911.  DO NOT DRIVE YOURSELF OR A FAMILY MEMBER WITH A DEFIBRILLATOR TO THE HOSPITAL--CALL 911.

## 2015-12-12 ENCOUNTER — Other Ambulatory Visit: Payer: Self-pay | Admitting: Cardiovascular Disease

## 2015-12-12 MED ORDER — FUROSEMIDE 40 MG PO TABS: 40.0000 mg | ORAL_TABLET | Freq: Every day | ORAL | 3 refills | Status: DC

## 2015-12-12 NOTE — Telephone Encounter (Signed)
Pt states he no lomnger takes extra lsaix for SOB,he only takes 40 mg daily

## 2015-12-13 ENCOUNTER — Telehealth: Payer: Self-pay | Admitting: Internal Medicine

## 2015-12-13 NOTE — Telephone Encounter (Signed)
[name] is calling because she is having the hardest time getting John [name] Lasix refilled  Because the pharmacy is stating that its to soon , but Dr. Lovena Le instructed him to take one twice a day.  Please call   Thanks

## 2015-12-13 NOTE — Telephone Encounter (Signed)
Spoke with the patients wife and let her know the med should be ready to pick up now.

## 2015-12-13 NOTE — Telephone Encounter (Signed)
New message   Pt c/o swelling: STAT is pt has developed SOB within 24 hours  1. How long have you been experiencing swelling? 9-18/17  2. Where is the swelling located? Both feet  3.  Are you currently taking a "fluid pill"? no  4.  Are you currently SOB? no  5.  Have you traveled recently? No   Pt gained 2 pounds over night

## 2015-12-13 NOTE — Telephone Encounter (Signed)
Spoke with patient's wife and he has been out of his fluid pill for 3 days.  Looks like it was called in yesterday but the pharmacy never got.  He will restart and call if the swelling is not better

## 2015-12-15 ENCOUNTER — Telehealth: Payer: Self-pay | Admitting: Internal Medicine

## 2015-12-15 NOTE — Telephone Encounter (Signed)
we did not fill the Tessalon, informed pt thay needed to call Murray Hodgkins MD, pt aware & expressed understanding.

## 2015-12-15 NOTE — Telephone Encounter (Signed)
New message   *STAT* If patient is at the pharmacy, call can be transferred to refill team.   1. Which medications need to be refilled? (please list name of each medication and dose if known) Tessalon 281m  2. Which pharmacy/location (including street and city if local pharmacy) is medication to be sent to? Per pt wife asked [facility]  3. Do they need a 30 day or 90 day supply? Per pt wife does not know   Comments: pt wife states she knows there are no more refills, but pt has a very bad cough and needs the med to be filled. Please call back to discuss

## 2015-12-16 ENCOUNTER — Encounter: Payer: Self-pay | Admitting: Family Medicine

## 2015-12-19 ENCOUNTER — Ambulatory Visit (INDEPENDENT_AMBULATORY_CARE_PROVIDER_SITE_OTHER): Payer: Commercial Managed Care - HMO | Admitting: *Deleted

## 2015-12-19 DIAGNOSIS — Z950 Presence of cardiac pacemaker: Secondary | ICD-10-CM | POA: Diagnosis not present

## 2015-12-19 LAB — CUP PACEART INCLINIC DEVICE CHECK
Battery Impedance: 100 Ohm
Battery Remaining Longevity: 114 mo
Battery Voltage: 2.79 V
Brady Statistic AP VP Percent: 28 %
Date Time Interrogation Session: 20170925135954
Implantable Lead Implant Date: 20170605
Implantable Lead Location: 753859
Implantable Lead Model: 5076
Lead Channel Pacing Threshold Amplitude: 0.5 V
Lead Channel Setting Pacing Amplitude: 3.5 V
Lead Channel Setting Pacing Amplitude: 3.5 V
Lead Channel Setting Pacing Pulse Width: 0.4 ms
Lead Channel Setting Sensing Sensitivity: 4 mV
MDC IDC LEAD IMPLANT DT: 20170605
MDC IDC LEAD LOCATION: 753860
MDC IDC MSMT LEADCHNL RA IMPEDANCE VALUE: 595 Ohm
MDC IDC MSMT LEADCHNL RA SENSING INTR AMPL: 2 mV
MDC IDC MSMT LEADCHNL RV IMPEDANCE VALUE: 614 Ohm
MDC IDC MSMT LEADCHNL RV PACING THRESHOLD PULSEWIDTH: 0.4 ms
MDC IDC STAT BRADY AP VS PERCENT: 0 %
MDC IDC STAT BRADY AS VP PERCENT: 51 %
MDC IDC STAT BRADY AS VS PERCENT: 21 %

## 2015-12-19 NOTE — Progress Notes (Signed)
Normal device function. Thresholds, sensing, and impedances consistent with implant measurements. Device programmed at 3.5V until 3 month visit. Histogram distribution appropriate for patient and level of activity. 14 mode switches (99.4% burden) + Eliquis. No high ventricular rates noted. Patient educated about wound care, arm mobility, lifting restrictions and home monitor use. ROV with GT 02/2016.

## 2015-12-20 DIAGNOSIS — E119 Type 2 diabetes mellitus without complications: Secondary | ICD-10-CM | POA: Diagnosis not present

## 2015-12-20 DIAGNOSIS — I10 Essential (primary) hypertension: Secondary | ICD-10-CM | POA: Diagnosis not present

## 2015-12-20 DIAGNOSIS — I509 Heart failure, unspecified: Secondary | ICD-10-CM | POA: Diagnosis not present

## 2015-12-20 DIAGNOSIS — E7800 Pure hypercholesterolemia, unspecified: Secondary | ICD-10-CM | POA: Diagnosis not present

## 2016-01-04 ENCOUNTER — Encounter: Payer: Self-pay | Admitting: Cardiovascular Disease

## 2016-01-04 ENCOUNTER — Ambulatory Visit (INDEPENDENT_AMBULATORY_CARE_PROVIDER_SITE_OTHER): Payer: Commercial Managed Care - HMO | Admitting: Cardiovascular Disease

## 2016-01-04 VITALS — BP 128/60 | HR 92 | Ht 71.0 in | Wt 253.0 lb

## 2016-01-04 DIAGNOSIS — E7800 Pure hypercholesterolemia, unspecified: Secondary | ICD-10-CM | POA: Diagnosis not present

## 2016-01-04 DIAGNOSIS — Z950 Presence of cardiac pacemaker: Secondary | ICD-10-CM | POA: Diagnosis not present

## 2016-01-04 DIAGNOSIS — I5022 Chronic systolic (congestive) heart failure: Secondary | ICD-10-CM | POA: Diagnosis not present

## 2016-01-04 DIAGNOSIS — Z5181 Encounter for therapeutic drug level monitoring: Secondary | ICD-10-CM

## 2016-01-04 DIAGNOSIS — I4819 Other persistent atrial fibrillation: Secondary | ICD-10-CM

## 2016-01-04 DIAGNOSIS — I428 Other cardiomyopathies: Secondary | ICD-10-CM

## 2016-01-04 DIAGNOSIS — I481 Persistent atrial fibrillation: Secondary | ICD-10-CM

## 2016-01-04 DIAGNOSIS — Z7901 Long term (current) use of anticoagulants: Secondary | ICD-10-CM

## 2016-01-04 DIAGNOSIS — I10 Essential (primary) hypertension: Secondary | ICD-10-CM | POA: Diagnosis not present

## 2016-01-04 MED ORDER — APIXABAN 5 MG PO TABS: 5.0000 mg | ORAL_TABLET | Freq: Two times a day (BID) | ORAL | 6 refills | Status: DC

## 2016-01-04 NOTE — Progress Notes (Signed)
SUBJECTIVE: Pt presents for routine follow up. Underwent pacemaker RA/RV lead revision 11/2015.   Echo 11/22/15 EF 40-45%.   Has been taking Eliquis once daily. Also taking ASA.  Feels well. No exertional chest pain or SOB. Occasional mild ankle swelling.   Review of Systems: As per "subjective", otherwise negative.  No Known Allergies  Current Outpatient Prescriptions  Medication Sig Dispense Refill  . amLODipine (NORVASC) 10 MG tablet Take 1 tablet (10 mg total) by mouth daily. 90 tablet 1  . apixaban (ELIQUIS) 5 MG TABS tablet Take 1 tablet (5 mg total) by mouth 2 (two) times daily. 60 tablet 6  . aspirin EC 81 MG tablet Take 1 tablet (81 mg total) by mouth daily.    . benazepril (LOTENSIN) 40 MG tablet Take 1 tablet (40 mg total) by mouth daily. 90 tablet 1  . benzonatate (TESSALON) 200 MG capsule Take 1 capsule (200 mg total) by mouth 3 (three) times daily as needed for cough. 60 capsule 0  . carvedilol (COREG) 25 MG tablet Take 1 tablet (25 mg total) by mouth 2 (two) times daily with a meal. 180 tablet 3  . furosemide (LASIX) 40 MG tablet Take 1 tablet (40 mg total) by mouth daily. 90 tablet 3  . Insulin Glargine (TOUJEO SOLOSTAR) 300 UNIT/ML SOPN Inject 45 Units into the skin daily.    . Magnesium 400 MG CAPS Take by mouth.    . Multiple Vitamins-Minerals (MULTIVITAMIN WITH MINERALS) tablet Take 1 tablet by mouth daily.    . Omega-3 Fatty Acids (FISH OIL) 1000 MG CAPS Take 1 capsule by mouth daily.    . potassium chloride 20 MEQ TBCR Take 20 mEq by mouth daily. 30 tablet 0  . Resveratrol 100 MG CAPS Take 100 mg by mouth daily.     . rosuvastatin (CRESTOR) 20 MG tablet Take 1 tablet (20 mg total) by mouth daily. 90 tablet 1  . sitaGLIPtan-metformin (JANUMET) 50-1000 MG per tablet Take 1 tablet by mouth 2 (two) times daily with a meal.      . tadalafil (CIALIS) 20 MG tablet Take 1 tablet (20 mg total) by mouth daily as needed. For erectile dysfunction (Patient taking  differently: Take 20 mg by mouth daily as needed for erectile dysfunction. For erectile dysfunction) 10 tablet 6  . tamsulosin (FLOMAX) 0.4 MG CAPS capsule Take 1 capsule (0.4 mg total) by mouth at bedtime. 90 capsule 1   No current facility-administered medications for this visit.     Past Medical History:  Diagnosis Date  . Atrial flutter ([place]) 12/2010   Admitted with symptomatic bradycardia (HR 40s), atrial flutter with slow ventricular response 12/2010 + volume overload; AV nodal agents d/c'd and Pradaxa started; RFA in 01/2011  . CHF (congestive heart failure) ([place])   . Chronic combined systolic and diastolic heart failure (HCC)    echo 01/06/11: mild LVH, EF 65-70%, mod to severe LAE, mild RVE, mild RAE, PASP 32;   TEE 10/12: EF 45-50%   . Diabetes mellitus    non insulin dependant  . Hyperlipidemia   . Hypertension   . Osteoarthritis   . PSVT (paroxysmal supraventricular tachycardia) (HCC)    Possibly atrial flutter    Past Surgical History:  Procedure Laterality Date  . ATRIAL FLUTTER ABLATION N/A 02/07/2011   Procedure: ATRIAL FLUTTER ABLATION;  Surgeon: Evans Lance, MD;  Location: [facility] CATH LAB;  Service: Cardiovascular;  Laterality: N/A;  . CARDIAC ELECTROPHYSIOLOGY STUDY AND ABLATION  02/07/11  .  EP IMPLANTABLE DEVICE N/A 08/29/2015   Procedure: Pacemaker Implant;  Surgeon: Evans Lance, MD;  Location: [facility] CV LAB;  Service: Cardiovascular;  Laterality: N/A;  . EP IMPLANTABLE DEVICE N/A 12/07/2015   Procedure: PPM Lead Revision/Repair;  Surgeon: Evans Lance, MD;  Location: [facility] CV LAB;  Service: Cardiovascular;  Laterality: N/A;  . KNEE ARTHROSCOPY  2005   left  . LUMBAR SPINE SURGERY     "I've had 6 ORs 1972 thru 2004"    Social History   Social History  . Marital status: Married    Spouse name: N/A  . Number of children: N/A  . Years of education: N/A   Occupational History  . Designer, industrial/product Retired   Social History Main Topics  .  Smoking status: Former Smoker    Packs/day: 1.00    Years: 10.00    Types: Cigarettes    Quit date: 03/26/1986  . Smokeless tobacco: Never Used     Comment: "stopped cigarette  smoking 1988"  . Alcohol use 0.0 oz/week     Comment: "quit alcohol ~ 2007"  . Drug use: No  . Sexual activity: Yes    Partners: Female   Other Topics Concern  . Not on file   Social History Narrative  . No narrative on file     Vitals:   01/04/16 0839  BP: 128/60  Pulse: 92  SpO2: 96%  Weight: 253 lb (114.8 kg)  Height: 5' 11"  (1.803 m)    PHYSICAL EXAM General: NAD HEENT: Normal. Neck: No JVD, no thyromegaly. Lungs: Clear to auscultation bilaterally with normal respiratory effort. CV: Nondisplaced PMI.  Regular rate and rhythm, normal S1/S2, no S3/S4, no murmur. Trace b/l periankle edema.    Abdomen: Obese.  Neurologic: Alert and oriented.  Psych: Normal affect. Skin: Normal. Musculoskeletal: No gross deformities.    ECG: Most recent ECG reviewed.      ASSESSMENT AND PLAN: 1. Persistent atrial fibrillation: Instructed to take Eliquis 5 mg BID. Will stop ASA.  2. Chronic systolic heart failure/cardiomyopathy: Euvolemic on Lasix 40 mg daily. No changes. Continue Coreg and benazepril. Will eventually need repeat echocardiogram to assess for improvement in LVEF.  3. Pacemaker s/p RA/RV lead revision 11/2015: Stable. Continue EP follow up.  4. HTN: Controlled. No changes.  5. Hyperlipidemia: Continue Crestor.  Dispo: fu 6 months.  Time spent: 40 minutes, of which greater than 50% was spent reviewing symptoms, relevant blood tests and studies, and discussing management plan with the patient.   [name], M.D., F.A.C.C.

## 2016-01-04 NOTE — Patient Instructions (Addendum)

## 2016-02-07 ENCOUNTER — Telehealth: Payer: Self-pay | Admitting: Internal Medicine

## 2016-02-07 NOTE — Telephone Encounter (Signed)
Called, spoke with pt. Pt stated his symptoms are the same. Informed I will speak with Dr. Lovena Le tomorrow and call pt back with Dr. Tanna Furry recommendation. Informed pt if symptoms get worse (before we contact back) to call our office, or report to the emergency department. Reminded pt to obtain and record vitals. Pt verbalized understanding and agreed with plan.

## 2016-02-07 NOTE — Telephone Encounter (Signed)
New message  Lasix, 2x daily, swelling in feet & stomach, SOB  Symptoms been going on for a week  Pt was advised to call if experienced these symptoms  Please return call

## 2016-02-07 NOTE — Telephone Encounter (Addendum)
Called, spoke with pt. Pt stated he has had swelling in legs and stomach for the past 10-12 days. Slight SOB. Weight past 3 days - 11/12 - 259 lbs,  11/13 - 257 lbs, today - 260 lbs. Vitals today - BP 128/70, HR 68, Sat 97%. Pt stated he has been taking an additional lasix for the past week. Pt's swelling has remained the same. Informed pt he needs to stay away from food/drink items that have moderate/high sodium. Also, to elevate pt's legs. Pt stated he has been following that plan. Informed pt I will forward to Dr. Lovena Le to advise (he is not in the office today). I will follow-up with pt as soon as I hear back from Dr. Lovena Le.

## 2016-02-08 NOTE — Telephone Encounter (Signed)
Called, spoke to pt. Informed pt to increase Lasix 40 mg twice a day. Avoid foods/drinks with sodium. Advised to obtain and record daily weight. If weight is > 265, report to the [facility] Emergency Department. If weight < 255, decrease Lasix to 40 mg once daily. If still swollen on Monday 02/13/16, call Dr. Court Joy office. Pt verbalized understanding and agreed with plan.

## 2016-02-08 NOTE — Telephone Encounter (Signed)
Have the patient go to the ER if weight above 265. If below 255, take one tablet daily. If weight 256-260, continue lasix twice a day and call Dr. Jacinta Shoe. GT

## 2016-02-08 NOTE — Telephone Encounter (Signed)
Follow up      Calling to see if the nurse has talked to Dr Lovena Le.  Please call

## 2016-03-07 ENCOUNTER — Encounter: Payer: Self-pay | Admitting: Internal Medicine

## 2016-03-07 ENCOUNTER — Ambulatory Visit (INDEPENDENT_AMBULATORY_CARE_PROVIDER_SITE_OTHER): Payer: Commercial Managed Care - HMO | Admitting: Internal Medicine

## 2016-03-07 VITALS — BP 138/84 | HR 88 | Ht 72.0 in | Wt 261.0 lb

## 2016-03-07 DIAGNOSIS — Z01812 Encounter for preprocedural laboratory examination: Secondary | ICD-10-CM

## 2016-03-07 DIAGNOSIS — I442 Atrioventricular block, complete: Secondary | ICD-10-CM

## 2016-03-07 DIAGNOSIS — Z79899 Other long term (current) drug therapy: Secondary | ICD-10-CM

## 2016-03-07 DIAGNOSIS — Z950 Presence of cardiac pacemaker: Secondary | ICD-10-CM | POA: Diagnosis not present

## 2016-03-07 DIAGNOSIS — R001 Bradycardia, unspecified: Secondary | ICD-10-CM

## 2016-03-07 LAB — CUP PACEART INCLINIC DEVICE CHECK
Battery Impedance: 100 Ohm
Battery Remaining Longevity: 126 mo
Battery Voltage: 2.79 V
Brady Statistic AP VP Percent: 24 %
Implantable Lead Implant Date: 20170605
Implantable Lead Location: 753859
Implantable Lead Model: 5076
Implantable Lead Model: 5076
Implantable Pulse Generator Implant Date: 20170605
Lead Channel Impedance Value: 567 Ohm
Lead Channel Pacing Threshold Pulse Width: 0.4 ms
Lead Channel Sensing Intrinsic Amplitude: 4 mV
Lead Channel Setting Pacing Amplitude: 2 V
Lead Channel Setting Pacing Amplitude: 2.5 V
Lead Channel Setting Pacing Pulse Width: 0.4 ms
Lead Channel Setting Sensing Sensitivity: 4 mV
MDC IDC LEAD IMPLANT DT: 20170605
MDC IDC LEAD LOCATION: 753860
MDC IDC MSMT LEADCHNL RV IMPEDANCE VALUE: 606 Ohm
MDC IDC MSMT LEADCHNL RV PACING THRESHOLD AMPLITUDE: 0.625 V
MDC IDC MSMT LEADCHNL RV PACING THRESHOLD AMPLITUDE: 0.75 V
MDC IDC MSMT LEADCHNL RV PACING THRESHOLD PULSEWIDTH: 0.4 ms
MDC IDC SESS DTM: 20171213110306
MDC IDC STAT BRADY AP VS PERCENT: 0 %
MDC IDC STAT BRADY AS VP PERCENT: 46 %
MDC IDC STAT BRADY AS VS PERCENT: 30 %

## 2016-03-07 LAB — CBC WITH DIFFERENTIAL/PLATELET
BASOS ABS: 87 {cells}/uL (ref 0–200)
Basophils Relative: 1 %
EOS ABS: 261 {cells}/uL (ref 15–500)
Eosinophils Relative: 3 %
HEMATOCRIT: 43 % (ref 38.5–50.0)
HEMOGLOBIN: 13.7 g/dL (ref 13.2–17.1)
LYMPHS PCT: 30 %
Lymphs Abs: 2610 cells/uL (ref 850–3900)
MCH: 26.8 pg — ABNORMAL LOW (ref 27.0–33.0)
MCHC: 31.9 g/dL — AB (ref 32.0–36.0)
MCV: 84 fL (ref 80.0–100.0)
MONO ABS: 783 {cells}/uL (ref 200–950)
MPV: 10.6 fL (ref 7.5–12.5)
Monocytes Relative: 9 %
NEUTROS PCT: 57 %
Neutro Abs: 4959 cells/uL (ref 1500–7800)
Platelets: 133 10*3/uL — ABNORMAL LOW (ref 140–400)
RBC: 5.12 MIL/uL (ref 4.20–5.80)
RDW: 17.5 % — AB (ref 11.0–15.0)
WBC: 8.7 10*3/uL (ref 3.8–10.8)

## 2016-03-07 LAB — BASIC METABOLIC PANEL
BUN: 15 mg/dL (ref 7–25)
CHLORIDE: 102 mmol/L (ref 98–110)
CO2: 24 mmol/L (ref 20–31)
Calcium: 9.2 mg/dL (ref 8.6–10.3)
Creat: 1.32 mg/dL — ABNORMAL HIGH (ref 0.70–1.18)
GLUCOSE: 275 mg/dL — AB (ref 65–99)
POTASSIUM: 4.2 mmol/L (ref 3.5–5.3)
Sodium: 137 mmol/L (ref 135–146)

## 2016-03-07 MED ORDER — FUROSEMIDE 40 MG PO TABS: 40.0000 mg | ORAL_TABLET | Freq: Two times a day (BID) | ORAL | 3 refills | Status: DC

## 2016-03-07 NOTE — Patient Instructions (Addendum)
Medication Instructions:  Your physician recommends that you continue on your current medications as directed. Please refer to the Current Medication list given to you today.    Labwork: TODAY: BMP / CBC w/ diff    Testing/Procedures: Cardioversion   Follow-Up: Your physician recommends that you schedule a follow-up appointment in: 4 weeks with Dr. Lovena Le    Procedure instructions: .  Report to the [facility] on 03/23/16 at 6:30 AM  Nothing to eat or drink after midnight prior to procedure  May take medication with a sip of water day of procedure---except HOLD diabetic medication  Will need someone to drive you home after the procedure  Any Other Special Instructions Will Be Listed Below (If Applicable). Follow a low sodium diet     If you need a refill on your cardiac medications before your next appointment, please call your pharmacy.

## 2016-03-07 NOTE — Progress Notes (Signed)
HPI [name] returns today for followup. He is a pleasant 70 yo man who was admitted with CHF and atrial flutter remotely who underwent catheter ablation years ago. He underwent PPM insertion 6 months ago. He was noted to have elevated atrial threshold and underwent lead revision at which time his ventricular lead appeared to be damaged as well and had both leads removed and new leads placed. In the interim he has felt better. He does have residual peripheral edema though he is on amlodipine.   No Known Allergies   Current Outpatient Prescriptions  Medication Sig Dispense Refill  . amLODipine (NORVASC) 10 MG tablet Take 1 tablet (10 mg total) by mouth daily. 90 tablet 1  . apixaban (ELIQUIS) 5 MG TABS tablet Take 1 tablet (5 mg total) by mouth 2 (two) times daily. 60 tablet 6  . benazepril (LOTENSIN) 40 MG tablet Take 1 tablet (40 mg total) by mouth daily. 90 tablet 1  . carvedilol (COREG) 25 MG tablet Take 1 tablet (25 mg total) by mouth 2 (two) times daily with a meal. 180 tablet 3  . furosemide (LASIX) 40 MG tablet Take 40 mg by mouth 2 (two) times daily.    . Insulin Glargine (TOUJEO SOLOSTAR) 300 UNIT/ML SOPN Inject 45 Units into the skin daily.    . Magnesium 400 MG CAPS Take 1 tablet by mouth daily.     . Multiple Vitamins-Minerals (MULTIVITAMIN WITH MINERALS) tablet Take 1 tablet by mouth daily.    Marland Kitchen Resveratrol 100 MG CAPS Take 100 mg by mouth daily.     . rosuvastatin (CRESTOR) 20 MG tablet Take 1 tablet (20 mg total) by mouth daily. 90 tablet 1  . sitaGLIPtan-metformin (JANUMET) 50-1000 MG per tablet Take 1 tablet by mouth 2 (two) times daily with a meal.      . tadalafil (CIALIS) 20 MG tablet Take 1 tablet (20 mg total) by mouth daily as needed. For erectile dysfunction 10 tablet 6  . tamsulosin (FLOMAX) 0.4 MG CAPS capsule Take 1 capsule (0.4 mg total) by mouth at bedtime. 90 capsule 1   No current facility-administered medications for this visit.      Past Medical History:   Diagnosis Date  . Atrial flutter ([place]) 12/2010   Admitted with symptomatic bradycardia (HR 40s), atrial flutter with slow ventricular response 12/2010 + volume overload; AV nodal agents d/c'd and Pradaxa started; RFA in 01/2011  . CHF (congestive heart failure) ([place])   . Chronic combined systolic and diastolic heart failure (HCC)    echo 01/06/11: mild LVH, EF 65-70%, mod to severe LAE, mild RVE, mild RAE, PASP 32;   TEE 10/12: EF 45-50%   . Diabetes mellitus    non insulin dependant  . Hyperlipidemia   . Hypertension   . Osteoarthritis   . PSVT (paroxysmal supraventricular tachycardia) (HCC)    Possibly atrial flutter    ROS:   All systems reviewed and negative except as noted in the HPI.   Past Surgical History:  Procedure Laterality Date  . ATRIAL FLUTTER ABLATION N/A 02/07/2011   Procedure: ATRIAL FLUTTER ABLATION;  Surgeon: Evans Lance, MD;  Location: [facility] CATH LAB;  Service: Cardiovascular;  Laterality: N/A;  . CARDIAC ELECTROPHYSIOLOGY STUDY AND ABLATION  02/07/11  . EP IMPLANTABLE DEVICE N/A 08/29/2015   Procedure: Pacemaker Implant;  Surgeon: Evans Lance, MD;  Location: [facility] CV LAB;  Service: Cardiovascular;  Laterality: N/A;  . EP IMPLANTABLE DEVICE N/A 12/07/2015   Procedure: PPM Lead Revision/Repair;  Surgeon: Evans Lance, MD;  Location: [facility] CV LAB;  Service: Cardiovascular;  Laterality: N/A;  . KNEE ARTHROSCOPY  2005   left  . LUMBAR SPINE SURGERY     "I've had 6 ORs 1972 thru 2004"     Family History  Problem Relation Age of Onset  . Heart attack Mother   . Hypertension Mother   . Heart attack Father   . Hypertension Father   . Heart attack Brother      Social History   Social History  . Marital status: Married    Spouse name: N/A  . Number of children: N/A  . Years of education: N/A   Occupational History  . Designer, industrial/product Retired   Social History Main Topics  . Smoking status: Former Smoker    Packs/day: 1.00     Years: 10.00    Types: Cigarettes    Quit date: 03/26/1986  . Smokeless tobacco: Never Used     Comment: "stopped cigarette  smoking 1988"  . Alcohol use 0.0 oz/week     Comment: "quit alcohol ~ 2007"  . Drug use: No  . Sexual activity: Yes    Partners: Female   Other Topics Concern  . Not on file   Social History Narrative  . No narrative on file     BP 138/84   Pulse 88   Ht 6' (1.829 m)   Wt 261 lb (118.4 kg)   BMI 35.40 kg/m   Physical Exam:  Well appearing 70 yo man, NAD HEENT: Unremarkable Neck:  6 cm JVD, no thyromegally Lymphatics:  No adenopathy Back:  No CVA tenderness Lungs:  Clear with no wheezes, rales, or rhonchi. HEART:  Regular rate rhythm, no murmurs, no rubs, no clicks Abd:  soft, positive bowel sounds, no organomegally, no rebound, no guarding Ext:  2 plus pulses, no cyanosis, no clubbing, left arm with minimal swelling Skin:  No rashes no nodules Neuro:  CN II through XII intact, motor grossly intact  PM interogation - normal device function  Assess/Plan:  1. PPM - we have interogated his device and it is working normally. Will recheck in several months. 2. HTN - his blood pressure is fairly well controlled. Will follow. We discussed making adjustments in his meds but have elected to continue. 3. Probable atrial fib - he will start Eliquis. His rate is slow. See below. 4. Acute on chronic systolic heart failure - he has developed worsening LV dysfunction. We discussed DCCV and he wants to think about it and will call us if he would like to proceed with DCCV.  Mikle Bosworth.D.

## 2016-03-07 NOTE — Addendum Note (Signed)
Addended by: Briant Cedar R on: 03/07/2016 11:00 AM   Modules accepted: Orders

## 2016-03-15 ENCOUNTER — Other Ambulatory Visit: Payer: Self-pay | Admitting: Family Medicine

## 2016-03-21 MED ORDER — LIDOCAINE-EPINEPHRINE (PF) 1 %-1:200000 IJ SOLN
INTRAMUSCULAR | Status: AC
  Filled 2016-03-21: qty 30

## 2016-03-22 ENCOUNTER — Other Ambulatory Visit: Payer: Self-pay | Admitting: Internal Medicine

## 2016-03-23 ENCOUNTER — Ambulatory Visit (HOSPITAL_COMMUNITY): Payer: Commercial Managed Care - HMO | Admitting: Anesthesiology

## 2016-03-23 ENCOUNTER — Ambulatory Visit (HOSPITAL_COMMUNITY)
Admission: RE | Admit: 2016-03-23 | Discharge: 2016-03-23 | Disposition: A | Discharge status: Home / Self Care | Payer: Commercial Managed Care - HMO | Source: Ambulatory Visit | Attending: Internal Medicine | Admitting: Internal Medicine

## 2016-03-23 ENCOUNTER — Encounter (HOSPITAL_COMMUNITY): Payer: Self-pay | Admitting: *Deleted

## 2016-03-23 ENCOUNTER — Encounter (HOSPITAL_COMMUNITY)
Admission: RE | Disposition: A | Discharge status: Home / Self Care | Payer: Self-pay | Source: Ambulatory Visit | Attending: Internal Medicine

## 2016-03-23 DIAGNOSIS — I5042 Chronic combined systolic (congestive) and diastolic (congestive) heart failure: Secondary | ICD-10-CM | POA: Insufficient documentation

## 2016-03-23 DIAGNOSIS — Z950 Presence of cardiac pacemaker: Secondary | ICD-10-CM | POA: Insufficient documentation

## 2016-03-23 DIAGNOSIS — Z794 Long term (current) use of insulin: Secondary | ICD-10-CM | POA: Diagnosis not present

## 2016-03-23 DIAGNOSIS — Z79899 Other long term (current) drug therapy: Secondary | ICD-10-CM | POA: Insufficient documentation

## 2016-03-23 DIAGNOSIS — E785 Hyperlipidemia, unspecified: Secondary | ICD-10-CM | POA: Insufficient documentation

## 2016-03-23 DIAGNOSIS — I110 Hypertensive heart disease with heart failure: Secondary | ICD-10-CM | POA: Diagnosis not present

## 2016-03-23 DIAGNOSIS — I4891 Unspecified atrial fibrillation: Secondary | ICD-10-CM | POA: Diagnosis not present

## 2016-03-23 DIAGNOSIS — Z87891 Personal history of nicotine dependence: Secondary | ICD-10-CM | POA: Diagnosis not present

## 2016-03-23 DIAGNOSIS — M1990 Unspecified osteoarthritis, unspecified site: Secondary | ICD-10-CM | POA: Diagnosis not present

## 2016-03-23 DIAGNOSIS — I442 Atrioventricular block, complete: Secondary | ICD-10-CM | POA: Insufficient documentation

## 2016-03-23 DIAGNOSIS — I4892 Unspecified atrial flutter: Secondary | ICD-10-CM | POA: Diagnosis not present

## 2016-03-23 DIAGNOSIS — E119 Type 2 diabetes mellitus without complications: Secondary | ICD-10-CM | POA: Diagnosis not present

## 2016-03-23 DIAGNOSIS — I481 Persistent atrial fibrillation: Secondary | ICD-10-CM | POA: Insufficient documentation

## 2016-03-23 DIAGNOSIS — I441 Atrioventricular block, second degree: Secondary | ICD-10-CM | POA: Diagnosis not present

## 2016-03-23 DIAGNOSIS — I471 Supraventricular tachycardia: Secondary | ICD-10-CM | POA: Insufficient documentation

## 2016-03-23 DIAGNOSIS — I5033 Acute on chronic diastolic (congestive) heart failure: Secondary | ICD-10-CM | POA: Diagnosis not present

## 2016-03-23 HISTORY — PX: CARDIOVERSION: SHX1299

## 2016-03-23 LAB — POCT I-STAT 4, (NA,K, GLUC, HGB,HCT)
Glucose, Bld: 198 mg/dL — ABNORMAL HIGH (ref 65–99)
HCT: 41 % (ref 39.0–52.0)
HEMOGLOBIN: 13.9 g/dL (ref 13.0–17.0)
POTASSIUM: 4.2 mmol/L (ref 3.5–5.1)
SODIUM: 139 mmol/L (ref 135–145)

## 2016-03-23 SURGERY — CARDIOVERSION
Anesthesia: Monitor Anesthesia Care

## 2016-03-23 MED ORDER — SODIUM CHLORIDE 0.9 % IV SOLN
INTRAVENOUS | Status: DC
  Administered 2016-03-23: 07:00:00 via INTRAVENOUS

## 2016-03-23 MED ORDER — LIDOCAINE HCL (CARDIAC) 20 MG/ML IV SOLN
INTRAVENOUS | Status: DC | PRN
  Administered 2016-03-23: 40 mg via INTRAVENOUS

## 2016-03-23 MED ORDER — PROPOFOL 10 MG/ML IV BOLUS
INTRAVENOUS | Status: DC | PRN
  Administered 2016-03-23: 80 mg via INTRAVENOUS

## 2016-03-23 NOTE — Transfer of Care (Signed)
Immediate Anesthesia Transfer of Care Note  Patient: John [name]  Procedure(s) Performed: Procedure(s): CARDIOVERSION (N/A)  Patient Location: PACU endo  Anesthesia Type:General  Level of Consciousness: awake, alert , oriented and patient cooperative  Airway & Oxygen Therapy: Patient Spontanous Breathing and Patient connected to nasal cannula oxygen  Post-op Assessment: Report given to RN and Post -op Vital signs reviewed and stable  Post vital signs: Reviewed and stable  Last Vitals:  Vitals:   03/23/16 0655  BP: 139/83  Pulse: 74  Resp: 16  Temp: 36.6 C    Last Pain:  Vitals:   03/23/16 0655  TempSrc: Oral         Complications: No apparent anesthesia complications

## 2016-03-23 NOTE — Anesthesia Postprocedure Evaluation (Signed)
Anesthesia Post Note  Patient: John [name]  Procedure(s) Performed: Procedure(s) (LRB): CARDIOVERSION (N/A)  Patient location during evaluation: Endoscopy Anesthesia Type: MAC Level of consciousness: awake Pain management: pain level controlled Vital Signs Assessment: post-procedure vital signs reviewed and stable Respiratory status: spontaneous breathing Cardiovascular status: stable Anesthetic complications: no       Last Vitals:  Vitals:   03/23/16 0830 03/23/16 0840  BP: 128/81 140/89  Pulse: (!) 58 (!) 59  Resp: 15 11  Temp:      Last Pain:  Vitals:   03/23/16 0811  TempSrc: Oral                 Meshach Perry

## 2016-03-23 NOTE — Interval H&P Note (Signed)
History and Physical Interval Note:  03/23/2016 7:35 AM  John [name]  has presented today for surgery, with the diagnosis of AFIB  The various methods of treatment have been discussed with the patient and family. After consideration of risks, benefits and other options for treatment, the patient has consented to  Procedure(s): CARDIOVERSION (N/A) as a surgical intervention .  The patient's history has been reviewed, patient examined, no change in status, stable for surgery.  I have reviewed the patient's chart and labs.  Questions were answered to the patient's satisfaction.     Mikle Bosworth.D.

## 2016-03-23 NOTE — CV Procedure (Signed)
EP procedure note  Procedure performed: DC cardioversion  Preoperative diagnosis: Atrial fibrillation in the setting of complete heart block, status post prior pacemaker insertion  Postoperative diagnosis: Same as preoperative diagnosis  Description of the procedure: After informed consent was obtained, the patient was sedated by the anesthesia service with intravenous propofol and lidocaine. 200 J of synchronized biphasic energy was applied to the electro- dispersive pads which had been placed in the anterior-posterior position. Sinus rhythm was immediately restored. The pacemaker was evaluated and atrial and ventricular pacing thresholds were appropriate and the pacing thresholds were reprogrammed to maximize battery longevity.  Complications: There are no immediate procedure cup locations  Conclusion: Successful DC cardioversion in a patient with complete heart block status post pacemaker insertion and persistent atrial fibrillation.  [name], M.D.

## 2016-03-23 NOTE — H&P (View-Only) (Signed)
HPI John [name] returns today for followup. He is a pleasant 70 yo man who was admitted with CHF and atrial flutter remotely who underwent catheter ablation years ago. He underwent PPM insertion 6 months ago. He was noted to have elevated atrial threshold and underwent lead revision at which time his ventricular lead appeared to be damaged as well and had both leads removed and new leads placed. In the interim he has felt better. He does have residual peripheral edema though he is on amlodipine.   No Known Allergies   Current Outpatient Prescriptions  Medication Sig Dispense Refill  . amLODipine (NORVASC) 10 MG tablet Take 1 tablet (10 mg total) by mouth daily. 90 tablet 1  . apixaban (ELIQUIS) 5 MG TABS tablet Take 1 tablet (5 mg total) by mouth 2 (two) times daily. 60 tablet 6  . benazepril (LOTENSIN) 40 MG tablet Take 1 tablet (40 mg total) by mouth daily. 90 tablet 1  . carvedilol (COREG) 25 MG tablet Take 1 tablet (25 mg total) by mouth 2 (two) times daily with a meal. 180 tablet 3  . furosemide (LASIX) 40 MG tablet Take 40 mg by mouth 2 (two) times daily.    . Insulin Glargine (TOUJEO SOLOSTAR) 300 UNIT/ML SOPN Inject 45 Units into the skin daily.    . Magnesium 400 MG CAPS Take 1 tablet by mouth daily.     . Multiple Vitamins-Minerals (MULTIVITAMIN WITH MINERALS) tablet Take 1 tablet by mouth daily.    Marland Kitchen Resveratrol 100 MG CAPS Take 100 mg by mouth daily.     . rosuvastatin (CRESTOR) 20 MG tablet Take 1 tablet (20 mg total) by mouth daily. 90 tablet 1  . sitaGLIPtan-metformin (JANUMET) 50-1000 MG per tablet Take 1 tablet by mouth 2 (two) times daily with a meal.      . tadalafil (CIALIS) 20 MG tablet Take 1 tablet (20 mg total) by mouth daily as needed. For erectile dysfunction 10 tablet 6  . tamsulosin (FLOMAX) 0.4 MG CAPS capsule Take 1 capsule (0.4 mg total) by mouth at bedtime. 90 capsule 1   No current facility-administered medications for this visit.      Past Medical History:   Diagnosis Date  . Atrial flutter ([place]) 12/2010   Admitted with symptomatic bradycardia (HR 40s), atrial flutter with slow ventricular response 12/2010 + volume overload; AV nodal agents d/c'd and Pradaxa started; RFA in 01/2011  . CHF (congestive heart failure) ([place])   . Chronic combined systolic and diastolic heart failure (HCC)    echo 01/06/11: mild LVH, EF 65-70%, mod to severe LAE, mild RVE, mild RAE, PASP 32;   TEE 10/12: EF 45-50%   . Diabetes mellitus    non insulin dependant  . Hyperlipidemia   . Hypertension   . Osteoarthritis   . PSVT (paroxysmal supraventricular tachycardia) (HCC)    Possibly atrial flutter    ROS:   All systems reviewed and negative except as noted in the HPI.   Past Surgical History:  Procedure Laterality Date  . ATRIAL FLUTTER ABLATION N/A 02/07/2011   Procedure: ATRIAL FLUTTER ABLATION;  Surgeon: Evans Lance, MD;  Location: [facility] CATH LAB;  Service: Cardiovascular;  Laterality: N/A;  . CARDIAC ELECTROPHYSIOLOGY STUDY AND ABLATION  02/07/11  . EP IMPLANTABLE DEVICE N/A 08/29/2015   Procedure: Pacemaker Implant;  Surgeon: Evans Lance, MD;  Location: [place] CV LAB;  Service: Cardiovascular;  Laterality: N/A;  . EP IMPLANTABLE DEVICE N/A 12/07/2015   Procedure: PPM Lead Revision/Repair;  Surgeon: Evans Lance, MD;  Location: [place] CV LAB;  Service: Cardiovascular;  Laterality: N/A;  . KNEE ARTHROSCOPY  2005   left  . LUMBAR SPINE SURGERY     "I've had 6 ORs 1972 thru 2004"     Family History  Problem Relation Age of Onset  . Heart attack Mother   . Hypertension Mother   . Heart attack Father   . Hypertension Father   . Heart attack Brother      Social History   Social History  . Marital status: Married    Spouse name: N/A  . Number of children: N/A  . Years of education: N/A   Occupational History  . Designer, industrial/product Retired   Social History Main Topics  . Smoking status: Former Smoker    Packs/day: 1.00     Years: 10.00    Types: Cigarettes    Quit date: 03/26/1986  . Smokeless tobacco: Never Used     Comment: "stopped cigarette  smoking 1988"  . Alcohol use 0.0 oz/week     Comment: "quit alcohol ~ 2007"  . Drug use: No  . Sexual activity: Yes    Partners: Female   Other Topics Concern  . Not on file   Social History Narrative  . No narrative on file     BP 138/84   Pulse 88   Ht 6' (1.829 m)   Wt 261 lb (118.4 kg)   BMI 35.40 kg/m   Physical Exam:  Well appearing 70 yo man, NAD HEENT: Unremarkable Neck:  6 cm JVD, no thyromegally Lymphatics:  No adenopathy Back:  No CVA tenderness Lungs:  Clear with no wheezes, rales, or rhonchi. HEART:  Regular rate rhythm, no murmurs, no rubs, no clicks Abd:  soft, positive bowel sounds, no organomegally, no rebound, no guarding Ext:  2 plus pulses, no cyanosis, no clubbing, left arm with minimal swelling Skin:  No rashes no nodules Neuro:  CN II through XII intact, motor grossly intact  PM interogation - normal device function  Assess/Plan:  1. PPM - we have interogated his device and it is working normally. Will recheck in several months. 2. HTN - his blood pressure is fairly well controlled. Will follow. We discussed making adjustments in his meds but have elected to continue. 3. Probable atrial fib - he will start Eliquis. His rate is slow. See below. 4. Acute on chronic systolic heart failure - he has developed worsening LV dysfunction. We discussed DCCV and he wants to think about it and will call us if he would like to proceed with DCCV.  John [name].D.

## 2016-03-23 NOTE — Discharge Instructions (Signed)
Electrical Cardioversion, Care After °This sheet gives you information about how to care for yourself after your procedure. Your health care provider may also give you more specific instructions. If you have problems or questions, contact your health care provider. °What can I expect after the procedure? °After the procedure, it is common to have: °· Some redness on the skin where the shocks were given. °Follow these instructions at home: °· Do not drive for 24 hours if you were given a medicine to help you relax (sedative). °· Take over-the-counter and prescription medicines only as told by your health care provider. °· Ask your health care provider how to check your pulse. Check it often. °· Rest for 48 hours after the procedure or as told by your health care provider. °· Avoid or limit your caffeine use as told by your health care provider. °Contact a health care provider if: °· You feel like your heart is beating too quickly or your pulse is not regular. °· You have a serious muscle cramp that does not go away. °Get help right away if: °· You have discomfort in your chest. °· You are dizzy or you feel faint. °· You have trouble breathing or you are short of breath. °· Your speech is slurred. °· You have trouble moving an arm or leg on one side of your body. °· Your fingers or toes turn cold or blue. °This information is not intended to replace advice given to you by your health care provider. Make sure you discuss any questions you have with your health care provider. °Document Released: 12/31/2012 Document Revised: 10/14/2015 Document Reviewed: 09/16/2015 °Elsevier Interactive Patient Education © 2017 Elsevier Inc. ° °

## 2016-03-23 NOTE — Anesthesia Preprocedure Evaluation (Signed)
Anesthesia Evaluation  Patient identified by MRN, date of birth, ID band Patient awake    Reviewed: Allergy & Precautions, NPO status , Patient's Chart, lab work & pertinent test results  Airway Mallampati: II  TM Distance: >3 FB     Dental   Pulmonary former smoker,    breath sounds clear to auscultation       Cardiovascular hypertension, + Peripheral Vascular Disease and +CHF  + dysrhythmias  Rhythm:Irregular Rate:Normal     Neuro/Psych    GI/Hepatic negative GI ROS, Neg liver ROS,   Endo/Other  diabetes  Renal/GU negative Renal ROS     Musculoskeletal  (+) Arthritis ,   Abdominal   Peds  Hematology   Anesthesia Other Findings   Reproductive/Obstetrics                             Anesthesia Physical Anesthesia Plan  ASA: III  Anesthesia Plan: MAC   Post-op Pain Management:    Induction: Intravenous  Airway Management Planned: Simple Face Mask  Additional Equipment:   Intra-op Plan:   Post-operative Plan:   Informed Consent: I have reviewed the patients History and Physical, chart, labs and discussed the procedure including the risks, benefits and alternatives for the proposed anesthesia with the patient or authorized representative who has indicated his/her understanding and acceptance.   Dental advisory given  Plan Discussed with: CRNA and Anesthesiologist  Anesthesia Plan Comments:         Anesthesia Quick Evaluation

## 2016-03-26 ENCOUNTER — Encounter (HOSPITAL_COMMUNITY): Payer: Self-pay | Admitting: Internal Medicine

## 2016-04-10 DIAGNOSIS — E1165 Type 2 diabetes mellitus with hyperglycemia: Secondary | ICD-10-CM | POA: Diagnosis not present

## 2016-04-12 ENCOUNTER — Encounter: Payer: Commercial Managed Care - HMO | Admitting: Internal Medicine

## 2016-04-17 ENCOUNTER — Ambulatory Visit (INDEPENDENT_AMBULATORY_CARE_PROVIDER_SITE_OTHER): Payer: Medicare HMO | Admitting: Internal Medicine

## 2016-04-17 ENCOUNTER — Encounter: Payer: Self-pay | Admitting: Internal Medicine

## 2016-04-17 VITALS — BP 128/72 | HR 65 | Ht 72.0 in | Wt 250.6 lb

## 2016-04-17 DIAGNOSIS — E1165 Type 2 diabetes mellitus with hyperglycemia: Secondary | ICD-10-CM | POA: Diagnosis not present

## 2016-04-17 DIAGNOSIS — I10 Essential (primary) hypertension: Secondary | ICD-10-CM | POA: Diagnosis not present

## 2016-04-17 DIAGNOSIS — I509 Heart failure, unspecified: Secondary | ICD-10-CM | POA: Diagnosis not present

## 2016-04-17 DIAGNOSIS — Z950 Presence of cardiac pacemaker: Secondary | ICD-10-CM | POA: Diagnosis not present

## 2016-04-17 DIAGNOSIS — E7800 Pure hypercholesterolemia, unspecified: Secondary | ICD-10-CM | POA: Diagnosis not present

## 2016-04-17 NOTE — Progress Notes (Signed)
HPI [name] returns today for followup. He is a pleasant 71 yo man who was admitted with CHF and atrial flutter remotely who underwent catheter ablation years ago. He underwent PPM insertion 8 months ago. He was noted to have elevated atrial threshold and underwent lead revision at which time his ventricular lead appeared to be damaged as well and had both leads removed and new leads placed. In the interim he has felt better. He was found to have atrial fib and has undergone DCCV 2-3 weeks ago. He feels better and his edema is improved.    No Known Allergies   Current Outpatient Prescriptions  Medication Sig Dispense Refill  . amLODipine (NORVASC) 10 MG tablet TAKE 1 TABLET EVERY DAY 90 tablet 0  . apixaban (ELIQUIS) 5 MG TABS tablet Take 1 tablet (5 mg total) by mouth 2 (two) times daily. 60 tablet 6  . benazepril (LOTENSIN) 40 MG tablet Take 1 tablet (40 mg total) by mouth daily. 90 tablet 1  . carvedilol (COREG) 25 MG tablet Take 1 tablet (25 mg total) by mouth 2 (two) times daily with a meal. 180 tablet 3  . furosemide (LASIX) 40 MG tablet Take 1 tablet (40 mg total) by mouth 2 (two) times daily. 180 tablet 3  . Insulin Glargine (TOUJEO SOLOSTAR) 300 UNIT/ML SOPN Inject 45 Units into the skin daily.    . Magnesium 400 MG CAPS Take 1 tablet by mouth daily.     . Multiple Vitamins-Minerals (MULTIVITAMIN WITH MINERALS) tablet Take 1 tablet by mouth daily.    Marland Kitchen Resveratrol 100 MG CAPS Take 100 mg by mouth daily.     . rosuvastatin (CRESTOR) 20 MG tablet Take 1 tablet (20 mg total) by mouth daily. 90 tablet 1  . sitaGLIPtan-metformin (JANUMET) 50-1000 MG per tablet Take 1 tablet by mouth 2 (two) times daily with a meal.      . tadalafil (CIALIS) 20 MG tablet Take 1 tablet (20 mg total) by mouth daily as needed. For erectile dysfunction 10 tablet 6  . tamsulosin (FLOMAX) 0.4 MG CAPS capsule Take 1 capsule (0.4 mg total) by mouth at bedtime. 90 capsule 1   No current facility-administered  medications for this visit.      Past Medical History:  Diagnosis Date  . Atrial flutter ([place]) 12/2010   Admitted with symptomatic bradycardia (HR 40s), atrial flutter with slow ventricular response 12/2010 + volume overload; AV nodal agents d/c'd and Pradaxa started; RFA in 01/2011  . CHF (congestive heart failure) ([place])   . Chronic combined systolic and diastolic heart failure (HCC)    echo 01/06/11: mild LVH, EF 65-70%, mod to severe LAE, mild RVE, mild RAE, PASP 32;   TEE 10/12: EF 45-50%   . Diabetes mellitus    non insulin dependant  . Hyperlipidemia   . Hypertension   . Osteoarthritis   . PSVT (paroxysmal supraventricular tachycardia) (HCC)    Possibly atrial flutter    ROS:   All systems reviewed and negative except as noted in the HPI.   Past Surgical History:  Procedure Laterality Date  . ATRIAL FLUTTER ABLATION N/A 02/07/2011   Procedure: ATRIAL FLUTTER ABLATION;  Surgeon: Evans Lance, MD;  Location: [facility] CATH LAB;  Service: Cardiovascular;  Laterality: N/A;  . CARDIAC ELECTROPHYSIOLOGY STUDY AND ABLATION  02/07/11  . CARDIOVERSION N/A 03/23/2016   Procedure: CARDIOVERSION;  Surgeon: Evans Lance, MD;  Location: [place];  Service: Cardiovascular;  Laterality: N/A;  . EP IMPLANTABLE DEVICE N/A  08/29/2015   Procedure: Pacemaker Implant;  Surgeon: Evans Lance, MD;  Location: [place] CV LAB;  Service: Cardiovascular;  Laterality: N/A;  . EP IMPLANTABLE DEVICE N/A 12/07/2015   Procedure: PPM Lead Revision/Repair;  Surgeon: Evans Lance, MD;  Location: [place] CV LAB;  Service: Cardiovascular;  Laterality: N/A;  . KNEE ARTHROSCOPY  2005   left  . LUMBAR SPINE SURGERY     "I've had 6 ORs 1972 thru 2004"     Family History  Problem Relation Age of Onset  . Heart attack Mother   . Hypertension Mother   . Heart attack Father   . Hypertension Father   . Heart attack Brother      Social History   Social History  . Marital status: Married     Spouse name: N/A  . Number of children: N/A  . Years of education: N/A   Occupational History  . Designer, industrial/product Retired   Social History Main Topics  . Smoking status: Former Smoker    Packs/day: 1.00    Years: 10.00    Types: Cigarettes    Quit date: 03/26/1986  . Smokeless tobacco: Never Used     Comment: "stopped cigarette  smoking 1988"  . Alcohol use 0.0 oz/week     Comment: "quit alcohol ~ 2007"  . Drug use: No  . Sexual activity: Yes    Partners: Female   Other Topics Concern  . Not on file   Social History Narrative  . No narrative on file     BP 128/72   Pulse 65   Ht 6' (1.829 m)   Wt 250 lb 9.6 oz (113.7 kg)   BMI 33.99 kg/m   Physical Exam:  Well appearing 71 yo man, NAD HEENT: Unremarkable Neck:  6 cm JVD, no thyromegally Lymphatics:  No adenopathy Back:  No CVA tenderness Lungs:  Clear with no wheezes, rales, or rhonchi. HEART:  Regular rate rhythm, no murmurs, no rubs, no clicks Abd:  soft, positive bowel sounds, no organomegally, no rebound, no guarding Ext:  2 plus pulses, no cyanosis, no clubbing, left arm with minimal swelling Skin:  No rashes no nodules Neuro:  CN II through XII intact, motor grossly intact  PM interogation - normal device function  Assess/Plan:  1. PPM - we have interogated his device and it is working normally. Will recheck in several months. 2. HTN - his blood pressure is fairly well controlled. Will follow. We discussed making adjustments in his meds but have elected to continue. 3.  atrial fib - he will start Eliquis. His rate is slow. See below. He has undergone successful DCCV and is maintaining NSR.  4. Acute on chronic systolic heart failure - he has developed worsening LV dysfunction. We discussed DCCV and he wants to think about it and will call us if he would like to proceed with DCCV.  Mikle Bosworth.D.

## 2016-04-17 NOTE — Patient Instructions (Addendum)
Medication Instructions:  Your physician recommends that you continue on your current medications as directed. Please refer to the Current Medication list given to you today.   Labwork: None Ordered   Testing/Procedures: None Ordered   Follow-Up: Your physician wants you to follow-up September 2018 with Dr. Lovena Le.  You will receive a reminder letter in the mail two months in advance. If you don't receive a letter, please call our office to schedule the follow-up appointment.  Remote monitoring is used to monitor your Pacemaker from home. This monitoring reduces the number of office visits required to check your device to one time per year. It allows [place] to keep an eye on the functioning of your device to ensure it is working properly. You are scheduled for a device check from home on 07/17/16. You may send your transmission at any time that day. If you have a wireless device, the transmission will be sent automatically. After your physician reviews your transmission, you will receive a postcard with your next transmission date.     Any Other Special Instructions Will Be Listed Below (If Applicable).     If you need a refill on your cardiac medications before your next appointment, please call your pharmacy.

## 2016-04-19 LAB — CUP PACEART INCLINIC DEVICE CHECK
Battery Impedance: 100 Ohm
Battery Remaining Longevity: 140 mo
Battery Voltage: 2.79 V
Brady Statistic AP VP Percent: 18 %
Brady Statistic AS VS Percent: 1 %
Implantable Lead Implant Date: 20170605
Implantable Lead Model: 5076
Implantable Lead Model: 5076
Implantable Pulse Generator Implant Date: 20170605
Lead Channel Impedance Value: 595 Ohm
Lead Channel Impedance Value: 617 Ohm
Lead Channel Pacing Threshold Amplitude: 0.75 V
Lead Channel Sensing Intrinsic Amplitude: 4 mV
Lead Channel Setting Pacing Amplitude: 2 V
MDC IDC LEAD IMPLANT DT: 20170605
MDC IDC LEAD LOCATION: 753859
MDC IDC LEAD LOCATION: 753860
MDC IDC MSMT LEADCHNL RA PACING THRESHOLD PULSEWIDTH: 0.4 ms
MDC IDC MSMT LEADCHNL RV PACING THRESHOLD AMPLITUDE: 0.75 V
MDC IDC MSMT LEADCHNL RV PACING THRESHOLD PULSEWIDTH: 0.4 ms
MDC IDC SESS DTM: 20180123185546
MDC IDC SET LEADCHNL RV PACING AMPLITUDE: 2.5 V
MDC IDC SET LEADCHNL RV PACING PULSEWIDTH: 0.4 ms
MDC IDC SET LEADCHNL RV SENSING SENSITIVITY: 4 mV
MDC IDC STAT BRADY AP VS PERCENT: 0 %
MDC IDC STAT BRADY AS VP PERCENT: 81 %

## 2016-04-24 DIAGNOSIS — I10 Essential (primary) hypertension: Secondary | ICD-10-CM | POA: Diagnosis not present

## 2016-04-24 DIAGNOSIS — I509 Heart failure, unspecified: Secondary | ICD-10-CM | POA: Diagnosis not present

## 2016-04-24 DIAGNOSIS — E1165 Type 2 diabetes mellitus with hyperglycemia: Secondary | ICD-10-CM | POA: Diagnosis not present

## 2016-04-24 DIAGNOSIS — E7800 Pure hypercholesterolemia, unspecified: Secondary | ICD-10-CM | POA: Diagnosis not present

## 2016-05-01 ENCOUNTER — Other Ambulatory Visit: Payer: Self-pay | Admitting: Family Medicine

## 2016-06-02 ENCOUNTER — Other Ambulatory Visit: Payer: Self-pay | Admitting: Cardiovascular Disease

## 2016-06-27 DIAGNOSIS — H35362 Drusen (degenerative) of macula, left eye: Secondary | ICD-10-CM | POA: Diagnosis not present

## 2016-07-09 ENCOUNTER — Ambulatory Visit (INDEPENDENT_AMBULATORY_CARE_PROVIDER_SITE_OTHER): Payer: Medicare HMO | Admitting: Cardiovascular Disease

## 2016-07-09 ENCOUNTER — Encounter: Payer: Self-pay | Admitting: Cardiovascular Disease

## 2016-07-09 VITALS — BP 134/72 | HR 61 | Ht 71.0 in | Wt 238.0 lb

## 2016-07-09 DIAGNOSIS — E7800 Pure hypercholesterolemia, unspecified: Secondary | ICD-10-CM | POA: Diagnosis not present

## 2016-07-09 DIAGNOSIS — I481 Persistent atrial fibrillation: Secondary | ICD-10-CM | POA: Diagnosis not present

## 2016-07-09 DIAGNOSIS — I10 Essential (primary) hypertension: Secondary | ICD-10-CM | POA: Diagnosis not present

## 2016-07-09 DIAGNOSIS — Z950 Presence of cardiac pacemaker: Secondary | ICD-10-CM

## 2016-07-09 DIAGNOSIS — I4819 Other persistent atrial fibrillation: Secondary | ICD-10-CM

## 2016-07-09 DIAGNOSIS — I5022 Chronic systolic (congestive) heart failure: Secondary | ICD-10-CM

## 2016-07-09 NOTE — Patient Instructions (Signed)

## 2016-07-09 NOTE — Progress Notes (Signed)
SUBJECTIVE: The patient presents for follow-up of chronic systolic heart failure and persistent atrial fibrillation. He also has a pacemaker.  He underwent successful cardioversion on 03/23/16.  He has feeling very well and denies shortness of breath and leg swelling. He said he is able to do activities that he has not been able to do in quite some time such as mowing his lawn. He denies palpitations and dizziness as well.   Review of Systems: As per "subjective", otherwise negative.  No Known Allergies  Current Outpatient Prescriptions  Medication Sig Dispense Refill  . amLODipine (NORVASC) 10 MG tablet TAKE 1 TABLET EVERY DAY 90 tablet 0  . benazepril (LOTENSIN) 40 MG tablet Take 1 tablet (40 mg total) by mouth daily. 90 tablet 1  . carvedilol (COREG) 25 MG tablet Take 1 tablet (25 mg total) by mouth 2 (two) times daily with a meal. 180 tablet 3  . ELIQUIS 5 MG TABS tablet TAKE 1 TABLET BY MOUTH 2 (TWO) TIMES DAILY. 180 tablet 6  . furosemide (LASIX) 40 MG tablet Take 1 tablet (40 mg total) by mouth 2 (two) times daily. 180 tablet 3  . Insulin Glargine (TOUJEO SOLOSTAR) 300 UNIT/ML SOPN Inject 45 Units into the skin daily.    . Magnesium 400 MG CAPS Take 1 tablet by mouth daily.     . Multiple Vitamins-Minerals (MULTIVITAMIN WITH MINERALS) tablet Take 1 tablet by mouth daily.    Marland Kitchen Resveratrol 100 MG CAPS Take 100 mg by mouth daily.     . rosuvastatin (CRESTOR) 20 MG tablet TAKE 1 TABLET (20 MG TOTAL) BY MOUTH DAILY. 90 tablet 0  . sitaGLIPtan-metformin (JANUMET) 50-1000 MG per tablet Take 1 tablet by mouth 2 (two) times daily with a meal.      . tadalafil (CIALIS) 20 MG tablet Take 1 tablet (20 mg total) by mouth daily as needed. For erectile dysfunction 10 tablet 6  . tamsulosin (FLOMAX) 0.4 MG CAPS capsule Take 1 capsule (0.4 mg total) by mouth at bedtime. 90 capsule 1   No current facility-administered medications for this visit.     Past Medical History:  Diagnosis Date   . Atrial flutter ([place]) 12/2010   Admitted with symptomatic bradycardia (HR 40s), atrial flutter with slow ventricular response 12/2010 + volume overload; AV nodal agents d/c'd and Pradaxa started; RFA in 01/2011  . CHF (congestive heart failure) ([place])   . Chronic combined systolic and diastolic heart failure (HCC)    echo 01/06/11: mild LVH, EF 65-70%, mod to severe LAE, mild RVE, mild RAE, PASP 32;   TEE 10/12: EF 45-50%   . Diabetes mellitus    non insulin dependant  . Hyperlipidemia   . Hypertension   . Osteoarthritis   . PSVT (paroxysmal supraventricular tachycardia) (HCC)    Possibly atrial flutter    Past Surgical History:  Procedure Laterality Date  . ATRIAL FLUTTER ABLATION N/A 02/07/2011   Procedure: ATRIAL FLUTTER ABLATION;  Surgeon: Evans Lance, MD;  Location: [facility] CATH LAB;  Service: Cardiovascular;  Laterality: N/A;  . CARDIAC ELECTROPHYSIOLOGY STUDY AND ABLATION  02/07/11  . CARDIOVERSION N/A 03/23/2016   Procedure: CARDIOVERSION;  Surgeon: Evans Lance, MD;  Location: [place];  Service: Cardiovascular;  Laterality: N/A;  . EP IMPLANTABLE DEVICE N/A 08/29/2015   Procedure: Pacemaker Implant;  Surgeon: Evans Lance, MD;  Location: [place] CV LAB;  Service: Cardiovascular;  Laterality: N/A;  . EP IMPLANTABLE DEVICE N/A 12/07/2015   Procedure: PPM  Lead Revision/Repair;  Surgeon: Evans Lance, MD;  Location: [place] CV LAB;  Service: Cardiovascular;  Laterality: N/A;  . KNEE ARTHROSCOPY  2005   left  . LUMBAR SPINE SURGERY     "I've had 6 ORs 1972 thru 2004"    Social History   Social History  . Marital status: Married    Spouse name: N/A  . Number of children: N/A  . Years of education: N/A   Occupational History  . Designer, industrial/product Retired   Social History Main Topics  . Smoking status: Former Smoker    Packs/day: 1.00    Years: 10.00    Types: Cigarettes    Quit date: 03/26/1986  . Smokeless tobacco: Never Used     Comment: "stopped  cigarette  smoking 1988"  . Alcohol use 0.0 oz/week     Comment: "quit alcohol ~ 2007"  . Drug use: No  . Sexual activity: Yes    Partners: Female   Other Topics Concern  . Not on file   Social History Narrative  . No narrative on file     Vitals:   07/09/16 0958  BP: 134/72  Pulse: 61  SpO2: 96%  Weight: 238 lb (108 kg)  Height: 5' 11"  (1.803 m)    Wt Readings from Last 3 Encounters:  07/09/16 238 lb (108 kg)  04/17/16 250 lb 9.6 oz (113.7 kg)  03/07/16 261 lb (118.4 kg)     PHYSICAL EXAM General: NAD HEENT: Normal. Neck: No JVD, no thyromegaly. Lungs: Clear to auscultation bilaterally with normal respiratory effort. CV: Nondisplaced PMI.  Regular rate and rhythm, normal S1/S2, no S3/S4, no murmur. No pretibial or periankle edema.  No carotid bruit.   Abdomen: Firm, obese.  Neurologic: Alert and oriented.  Psych: Normal affect. Skin: Normal. Musculoskeletal: No gross deformities.    ECG: Most recent ECG reviewed.   Labs: Lab Results  Component Value Date/Time   K 4.2 03/23/2016 07:11 AM   BUN 15 03/07/2016 10:43 AM   BUN 22 12/05/2015 08:38 AM   CREATININE 1.32 (H) 03/07/2016 10:43 AM   ALT 21 11/20/2015 03:15 PM   TSH 1.563 08/25/2015 10:39 AM   TSH 1.206 01/05/2011 03:46 PM   HGB 13.9 03/23/2016 07:11 AM     Lipids: Lab Results  Component Value Date/Time   LDLCALC 45 10/26/2015 08:23 AM   CHOL 132 10/26/2015 08:23 AM   TRIG 76 10/26/2015 08:23 AM   HDL 72 10/26/2015 08:23 AM       ASSESSMENT AND PLAN: 1. Persistent atrial fibrillation: Continue carvedilol and Eliquis. Symptomatically stable s/p DCCV in 02/2016.  2. Chronic systolic heart failure/cardiomyopathy: LVEF 40-45% on 11/22/15. Euvolemic on Lasix 40 mg twice daily. Continue carvedilol and benazepril.  3. Pacemaker s/p RA/RV lead revision 11/2015: Stable. Normal device function. Continue follow up with EP.  4. Hypertension: Controlled on present therapy. No changes.  5.  Hyperlipidemia: Continue statin.  6. Aortic stenosis: Mild in 10/2015. Stable.    Disposition: Follow up 6 months  [name], M.D., F.A.C.C.

## 2016-07-10 DIAGNOSIS — E1165 Type 2 diabetes mellitus with hyperglycemia: Secondary | ICD-10-CM | POA: Diagnosis not present

## 2016-07-10 DIAGNOSIS — Z125 Encounter for screening for malignant neoplasm of prostate: Secondary | ICD-10-CM | POA: Diagnosis not present

## 2016-07-10 DIAGNOSIS — E119 Type 2 diabetes mellitus without complications: Secondary | ICD-10-CM | POA: Diagnosis not present

## 2016-07-10 DIAGNOSIS — I10 Essential (primary) hypertension: Secondary | ICD-10-CM | POA: Diagnosis not present

## 2016-07-10 DIAGNOSIS — I509 Heart failure, unspecified: Secondary | ICD-10-CM | POA: Diagnosis not present

## 2016-07-10 DIAGNOSIS — E559 Vitamin D deficiency, unspecified: Secondary | ICD-10-CM | POA: Diagnosis not present

## 2016-07-10 DIAGNOSIS — E7800 Pure hypercholesterolemia, unspecified: Secondary | ICD-10-CM | POA: Diagnosis not present

## 2016-07-11 ENCOUNTER — Other Ambulatory Visit: Payer: Self-pay | Admitting: Family Medicine

## 2016-07-16 ENCOUNTER — Ambulatory Visit: Payer: Medicare HMO | Admitting: Cardiovascular Disease

## 2016-07-17 ENCOUNTER — Ambulatory Visit (INDEPENDENT_AMBULATORY_CARE_PROVIDER_SITE_OTHER): Payer: Medicare HMO | Admitting: *Deleted

## 2016-07-17 DIAGNOSIS — I442 Atrioventricular block, complete: Secondary | ICD-10-CM

## 2016-07-18 ENCOUNTER — Telehealth: Payer: Self-pay | Admitting: Internal Medicine

## 2016-07-18 ENCOUNTER — Telehealth: Payer: Self-pay | Admitting: Cardiology

## 2016-07-18 DIAGNOSIS — E113293 Type 2 diabetes mellitus with mild nonproliferative diabetic retinopathy without macular edema, bilateral: Secondary | ICD-10-CM | POA: Diagnosis not present

## 2016-07-18 DIAGNOSIS — H25012 Cortical age-related cataract, left eye: Secondary | ICD-10-CM | POA: Diagnosis not present

## 2016-07-18 DIAGNOSIS — H40013 Open angle with borderline findings, low risk, bilateral: Secondary | ICD-10-CM | POA: Diagnosis not present

## 2016-07-18 DIAGNOSIS — H2513 Age-related nuclear cataract, bilateral: Secondary | ICD-10-CM | POA: Diagnosis not present

## 2016-07-18 DIAGNOSIS — H25013 Cortical age-related cataract, bilateral: Secondary | ICD-10-CM | POA: Diagnosis not present

## 2016-07-18 DIAGNOSIS — H2512 Age-related nuclear cataract, left eye: Secondary | ICD-10-CM | POA: Diagnosis not present

## 2016-07-18 NOTE — Telephone Encounter (Signed)
Called pt and let him know that transmission was received.

## 2016-07-18 NOTE — Telephone Encounter (Signed)
New Message  Pt is returning your call 

## 2016-07-18 NOTE — Telephone Encounter (Signed)
LMOVM reminding pt to send remote transmission.   

## 2016-07-19 LAB — CUP PACEART REMOTE DEVICE CHECK
Battery Impedance: 100 Ohm
Battery Remaining Longevity: 138 mo
Battery Voltage: 2.79 V
Brady Statistic AP VS Percent: 0 %
Brady Statistic AS VP Percent: 64 %
Date Time Interrogation Session: 20180425190229
Implantable Lead Implant Date: 20170605
Implantable Lead Location: 753860
Implantable Lead Model: 5076
Implantable Lead Model: 5076
Implantable Pulse Generator Implant Date: 20170605
Lead Channel Impedance Value: 617 Ohm
Lead Channel Pacing Threshold Amplitude: 0.875 V
Lead Channel Pacing Threshold Pulse Width: 0.4 ms
Lead Channel Pacing Threshold Pulse Width: 0.4 ms
Lead Channel Setting Pacing Amplitude: 2.5 V
MDC IDC LEAD IMPLANT DT: 20170605
MDC IDC LEAD LOCATION: 753859
MDC IDC MSMT LEADCHNL RA IMPEDANCE VALUE: 541 Ohm
MDC IDC MSMT LEADCHNL RA PACING THRESHOLD AMPLITUDE: 0.625 V
MDC IDC SET LEADCHNL RA PACING AMPLITUDE: 2 V
MDC IDC SET LEADCHNL RV PACING PULSEWIDTH: 0.4 ms
MDC IDC SET LEADCHNL RV SENSING SENSITIVITY: 4 mV
MDC IDC STAT BRADY AP VP PERCENT: 36 %
MDC IDC STAT BRADY AS VS PERCENT: 0 %

## 2016-07-19 NOTE — Progress Notes (Signed)
Remote pacemaker transmission.   

## 2016-07-20 ENCOUNTER — Encounter: Payer: Self-pay | Admitting: Cardiology

## 2016-07-31 DIAGNOSIS — H25812 Combined forms of age-related cataract, left eye: Secondary | ICD-10-CM | POA: Diagnosis not present

## 2016-07-31 DIAGNOSIS — H2512 Age-related nuclear cataract, left eye: Secondary | ICD-10-CM | POA: Diagnosis not present

## 2016-08-18 ENCOUNTER — Other Ambulatory Visit: Payer: Self-pay | Admitting: Adult Health

## 2016-08-22 DIAGNOSIS — H25011 Cortical age-related cataract, right eye: Secondary | ICD-10-CM | POA: Diagnosis not present

## 2016-08-22 DIAGNOSIS — H2511 Age-related nuclear cataract, right eye: Secondary | ICD-10-CM | POA: Diagnosis not present

## 2016-08-28 DIAGNOSIS — H2511 Age-related nuclear cataract, right eye: Secondary | ICD-10-CM | POA: Diagnosis not present

## 2016-08-28 DIAGNOSIS — H25811 Combined forms of age-related cataract, right eye: Secondary | ICD-10-CM | POA: Diagnosis not present

## 2016-10-17 ENCOUNTER — Ambulatory Visit (INDEPENDENT_AMBULATORY_CARE_PROVIDER_SITE_OTHER): Payer: Medicare HMO | Admitting: *Deleted

## 2016-10-17 ENCOUNTER — Telehealth: Payer: Self-pay | Admitting: Cardiology

## 2016-10-17 DIAGNOSIS — I442 Atrioventricular block, complete: Secondary | ICD-10-CM | POA: Diagnosis not present

## 2016-10-17 NOTE — Progress Notes (Signed)
Remote pacemaker transmission.   

## 2016-10-17 NOTE — Telephone Encounter (Signed)
Spoke with pt and reminded pt of remote transmission that is due today. Pt verbalized understanding.   

## 2016-10-18 ENCOUNTER — Encounter: Payer: Self-pay | Admitting: Cardiology

## 2016-11-06 DIAGNOSIS — E7800 Pure hypercholesterolemia, unspecified: Secondary | ICD-10-CM | POA: Diagnosis not present

## 2016-11-06 DIAGNOSIS — I509 Heart failure, unspecified: Secondary | ICD-10-CM | POA: Diagnosis not present

## 2016-11-06 DIAGNOSIS — E1165 Type 2 diabetes mellitus with hyperglycemia: Secondary | ICD-10-CM | POA: Diagnosis not present

## 2016-11-06 DIAGNOSIS — I10 Essential (primary) hypertension: Secondary | ICD-10-CM | POA: Diagnosis not present

## 2016-11-07 ENCOUNTER — Encounter: Payer: Self-pay | Admitting: Internal Medicine

## 2016-11-13 LAB — CUP PACEART REMOTE DEVICE CHECK
Battery Impedance: 100 Ohm
Brady Statistic AP VS Percent: 0 %
Brady Statistic AS VS Percent: 0 %
Date Time Interrogation Session: 20180725122837
Implantable Lead Implant Date: 20170605
Implantable Lead Location: 753860
Lead Channel Impedance Value: 493 Ohm
Lead Channel Pacing Threshold Amplitude: 0.75 V
Lead Channel Pacing Threshold Pulse Width: 0.4 ms
Lead Channel Pacing Threshold Pulse Width: 0.4 ms
MDC IDC LEAD IMPLANT DT: 20170605
MDC IDC LEAD LOCATION: 753859
MDC IDC MSMT BATTERY REMAINING LONGEVITY: 133 mo
MDC IDC MSMT BATTERY VOLTAGE: 2.79 V
MDC IDC MSMT LEADCHNL RA IMPEDANCE VALUE: 548 Ohm
MDC IDC MSMT LEADCHNL RA PACING THRESHOLD AMPLITUDE: 0.625 V
MDC IDC PG IMPLANT DT: 20170605
MDC IDC SET LEADCHNL RA PACING AMPLITUDE: 2 V
MDC IDC SET LEADCHNL RV PACING AMPLITUDE: 2.5 V
MDC IDC SET LEADCHNL RV PACING PULSEWIDTH: 0.4 ms
MDC IDC SET LEADCHNL RV SENSING SENSITIVITY: 4 mV
MDC IDC STAT BRADY AP VP PERCENT: 38 %
MDC IDC STAT BRADY AS VP PERCENT: 62 %

## 2016-11-16 ENCOUNTER — Other Ambulatory Visit: Payer: Self-pay | Admitting: Family Medicine

## 2016-11-21 DIAGNOSIS — E7800 Pure hypercholesterolemia, unspecified: Secondary | ICD-10-CM | POA: Diagnosis not present

## 2016-11-21 DIAGNOSIS — I10 Essential (primary) hypertension: Secondary | ICD-10-CM | POA: Diagnosis not present

## 2016-11-21 DIAGNOSIS — I509 Heart failure, unspecified: Secondary | ICD-10-CM | POA: Diagnosis not present

## 2016-11-21 DIAGNOSIS — E1165 Type 2 diabetes mellitus with hyperglycemia: Secondary | ICD-10-CM | POA: Diagnosis not present

## 2016-11-30 ENCOUNTER — Ambulatory Visit (INDEPENDENT_AMBULATORY_CARE_PROVIDER_SITE_OTHER): Payer: Medicare HMO | Admitting: Internal Medicine

## 2016-11-30 ENCOUNTER — Encounter: Payer: Self-pay | Admitting: Internal Medicine

## 2016-11-30 ENCOUNTER — Encounter (INDEPENDENT_AMBULATORY_CARE_PROVIDER_SITE_OTHER): Payer: Self-pay

## 2016-11-30 VITALS — BP 144/70 | HR 74 | Ht 71.0 in | Wt 257.0 lb

## 2016-11-30 DIAGNOSIS — I481 Persistent atrial fibrillation: Secondary | ICD-10-CM

## 2016-11-30 DIAGNOSIS — I442 Atrioventricular block, complete: Secondary | ICD-10-CM

## 2016-11-30 DIAGNOSIS — I4819 Other persistent atrial fibrillation: Secondary | ICD-10-CM

## 2016-11-30 NOTE — Progress Notes (Signed)
HPI [name] returns today for follow-up. He is a 71 year old man with a history of high-grade heart block, status post permanent pacemaker insertion, with a history of atrial flutter status post ablation, with a history of paroxysmal atrial fibrillation status post cardioversion. In the interim, he is done well. He denies palpitations, chest pain, or shortness of breath. Minimal peripheral edema. No syncope. No Known Allergies   Current Outpatient Prescriptions  Medication Sig Dispense Refill  . amLODipine (NORVASC) 10 MG tablet TAKE 1 TABLET (10 MG TOTAL) BY MOUTH DAILY. 30 tablet 0  . benazepril (LOTENSIN) 40 MG tablet TAKE 1 TABLET EVERY DAY 90 tablet 1  . carvedilol (COREG) 25 MG tablet TAKE 1 TABLET (25 MG TOTAL) BY MOUTH 2 (TWO) TIMES DAILY WITH A MEAL. 180 tablet 3  . ELIQUIS 5 MG TABS tablet TAKE 1 TABLET BY MOUTH 2 (TWO) TIMES DAILY. 180 tablet 6  . furosemide (LASIX) 40 MG tablet Take 1 tablet (40 mg total) by mouth 2 (two) times daily. 180 tablet 3  . Insulin Glargine (TOUJEO SOLOSTAR) 300 UNIT/ML SOPN Inject 45 Units into the skin daily.    . Magnesium 400 MG CAPS Take 1 tablet by mouth daily.     . Multiple Vitamins-Minerals (MULTIVITAMIN WITH MINERALS) tablet Take 1 tablet by mouth daily.    Marland Kitchen Resveratrol 100 MG CAPS Take 100 mg by mouth daily.     . rosuvastatin (CRESTOR) 20 MG tablet TAKE 1 TABLET EVERY DAY 30 tablet 0  . sitaGLIPtan-metformin (JANUMET) 50-1000 MG per tablet Take 1 tablet by mouth 2 (two) times daily with a meal.      . tadalafil (CIALIS) 20 MG tablet Take 1 tablet (20 mg total) by mouth daily as needed. For erectile dysfunction 10 tablet 6  . tamsulosin (FLOMAX) 0.4 MG CAPS capsule Take 1 capsule (0.4 mg total) by mouth at bedtime. 90 capsule 1  . TRULICITY 1.61 WR/6.0AV SOPN INJECT 0.75 MG INTO THE SKIN ONCE A WEEK  3   No current facility-administered medications for this visit.      Past Medical History:  Diagnosis Date  . Atrial flutter  ([place]) 12/2010   Admitted with symptomatic bradycardia (HR 40s), atrial flutter with slow ventricular response 12/2010 + volume overload; AV nodal agents d/c'd and Pradaxa started; RFA in 01/2011  . CHF (congestive heart failure) ([place])   . Chronic combined systolic and diastolic heart failure (HCC)    echo 01/06/11: mild LVH, EF 65-70%, mod to severe LAE, mild RVE, mild RAE, PASP 32;   TEE 10/12: EF 45-50%   . Diabetes mellitus    non insulin dependant  . Hyperlipidemia   . Hypertension   . Osteoarthritis   . PSVT (paroxysmal supraventricular tachycardia) (HCC)    Possibly atrial flutter    ROS:   All systems reviewed and negative except as noted in the HPI.   Past Surgical History:  Procedure Laterality Date  . ATRIAL FLUTTER ABLATION N/A 02/07/2011   Procedure: ATRIAL FLUTTER ABLATION;  Surgeon: Evans Lance, MD;  Location: [facility] CATH LAB;  Service: Cardiovascular;  Laterality: N/A;  . CARDIAC ELECTROPHYSIOLOGY STUDY AND ABLATION  02/07/11  . CARDIOVERSION N/A 03/23/2016   Procedure: CARDIOVERSION;  Surgeon: Evans Lance, MD;  Location: [place];  Service: Cardiovascular;  Laterality: N/A;  . EP IMPLANTABLE DEVICE N/A 08/29/2015   Procedure: Pacemaker Implant;  Surgeon: Evans Lance, MD;  Location: [place] CV LAB;  Service: Cardiovascular;  Laterality: N/A;  .  EP IMPLANTABLE DEVICE N/A 12/07/2015   Procedure: PPM Lead Revision/Repair;  Surgeon: Evans Lance, MD;  Location: [place] CV LAB;  Service: Cardiovascular;  Laterality: N/A;  . KNEE ARTHROSCOPY  2005   left  . LUMBAR SPINE SURGERY     "I've had 6 ORs 1972 thru 2004"     Family History  Problem Relation Age of Onset  . Heart attack Mother   . Hypertension Mother   . Heart attack Father   . Hypertension Father   . Heart attack Brother      Social History   Social History  . Marital status: Married    Spouse name: N/A  . Number of children: N/A  . Years of education: N/A   Occupational History   . Designer, industrial/product Retired   Social History Main Topics  . Smoking status: Former Smoker    Packs/day: 1.00    Years: 10.00    Types: Cigarettes    Quit date: 03/26/1986  . Smokeless tobacco: Never Used     Comment: "stopped cigarette  smoking 1988"  . Alcohol use 0.0 oz/week     Comment: "quit alcohol ~ 2007"  . Drug use: No  . Sexual activity: Yes    Partners: Female   Other Topics Concern  . Not on file   Social History Narrative  . No narrative on file     BP (!) 144/70   Pulse 74   Ht 5' 11"  (1.803 m)   Wt 257 lb (116.6 kg)   BMI 35.84 kg/m   Physical Exam:  Well appearing NAD HEENT: Unremarkable Neck:  No JVD, no thyromegally Lymphatics:  No adenopathy Back:  No CVA tenderness Lungs:  Clear, With no wheezes, rales, or rhonchi. Well-healed pacemaker incision. HEART:  Regular rate rhythm, no murmurs, no rubs, no clicks Abd:  soft, positive bowel sounds, no organomegally, no rebound, no guarding Ext:  2 plus pulses, no edema, no cyanosis, no clubbing Skin:  No rashes no nodules Neuro:  CN II through XII intact, motor grossly intact   DEVICE  Normal device function.  See PaceArt for details.   Assess/Plan: 1. Complete heart block - he is status post permanent pacemaker insertion and is asymptomatic 2. Paroxysmal atrial fibrillation - he is maintaining sinus rhythm 99.9% of the time. No change in medications. 3. Atrial flutter - he is status post catheter ablation, and his had no recurrent arrhythmias. 4. Dual-chamber pacemaker - his Medtronic dual-chamber pacemaker is working normally. We'll recheck in several months. 5. Hypertensive heart disease - the importance of a low-sodium diet was discussed. He will continue his current medical therapy with calcium channel blockers, ACE inhibitor, and diuretic.  [name], M.D.

## 2016-11-30 NOTE — Patient Instructions (Addendum)
Medication Instructions:  Your physician recommends that you continue on your current medications as directed. Please refer to the Current Medication list given to you today.  Labwork: None ordered.  Testing/Procedures: None ordered.  Follow-Up: Your physician wants you to follow-up in: one year with Dr. Lovena Le.   You will receive a reminder letter in the mail two months in advance. If you don't receive a letter, please call our office to schedule the follow-up appointment.  Remote monitoring is used to monitor your Pacemaker from home. This monitoring reduces the number of office visits required to check your device to one time per year. It allows [place] to keep an eye on the functioning of your device to ensure it is working properly. You are scheduled for a device check from home on 01/16/2017. You may send your transmission at any time that day. If you have a wireless device, the transmission will be sent automatically. After your physician reviews your transmission, you will receive a postcard with your next transmission date.    Any Other Special Instructions Will Be Listed Below (If Applicable).     If you need a refill on your cardiac medications before your next appointment, please call your pharmacy.

## 2016-12-11 ENCOUNTER — Ambulatory Visit (INDEPENDENT_AMBULATORY_CARE_PROVIDER_SITE_OTHER): Payer: Medicare HMO | Admitting: Family Medicine

## 2016-12-11 ENCOUNTER — Encounter: Payer: Self-pay | Admitting: Family Medicine

## 2016-12-11 VITALS — BP 122/78 | Ht 71.0 in | Wt 257.0 lb

## 2016-12-11 DIAGNOSIS — Z23 Encounter for immunization: Secondary | ICD-10-CM

## 2016-12-11 DIAGNOSIS — I10 Essential (primary) hypertension: Secondary | ICD-10-CM

## 2016-12-11 DIAGNOSIS — Z125 Encounter for screening for malignant neoplasm of prostate: Secondary | ICD-10-CM

## 2016-12-11 DIAGNOSIS — Z0000 Encounter for general adult medical examination without abnormal findings: Secondary | ICD-10-CM

## 2016-12-11 DIAGNOSIS — E785 Hyperlipidemia, unspecified: Secondary | ICD-10-CM

## 2016-12-11 DIAGNOSIS — Z1211 Encounter for screening for malignant neoplasm of colon: Secondary | ICD-10-CM | POA: Diagnosis not present

## 2016-12-11 MED ORDER — TAMSULOSIN HCL 0.4 MG PO CAPS: 0.4000 mg | ORAL_CAPSULE | Freq: Every day | ORAL | 1 refills | Status: DC

## 2016-12-11 MED ORDER — AMLODIPINE BESYLATE 10 MG PO TABS: ORAL_TABLET | ORAL | 1 refills | Status: DC

## 2016-12-11 MED ORDER — BENAZEPRIL HCL 40 MG PO TABS: 40.0000 mg | ORAL_TABLET | Freq: Every day | ORAL | 1 refills | Status: DC

## 2016-12-11 MED ORDER — ROSUVASTATIN CALCIUM 20 MG PO TABS: 20.0000 mg | ORAL_TABLET | Freq: Every day | ORAL | 1 refills | Status: DC

## 2016-12-11 NOTE — Progress Notes (Signed)
Subjective:    Patient ID: John [name], male    DOB: June 26, 1945, 71 y.o.   MRN: [id number]  HPI AWV- Annual Wellness Visit  The patient was seen for their annual wellness visit. The patient's past medical history, surgical history, and family history were reviewed. Pertinent vaccines were reviewed ( tetanus, pneumonia, shingles, flu) The patient's medication list was reviewed and updated.  The height and weight were entered. The patient's current BMI is:35.86  Cognitive screening was completed. Outcome of Mini - Cog: pass  Falls within the past 6 months:none  Current tobacco usage: none (All patients who use tobacco were given written and verbal information on quitting)  Recent listing of emergency department/hospitalizations over the past year were reviewed.  current specialist the patient sees on a regular basis: endocrinologist Dr Carlis Abbott for diabetes   Medicare annual wellness visit patient questionnaire was reviewed.  A written screening schedule for the patient for the next 5-10 years was given. Appropriate discussion of followup regarding next visit was discussed.   Blood pressure medicine and blood pressure levels reviewed today with patient. Compliant with blood pressure medicine. States does not miss a dose. No obvious side effects. Blood pressure generally good when checked elsewhere. Watching salt intake.  Patient continues to take lipid medication regularly. No obvious side effects from it. Generally does not miss a dose. Prior blood work results are reviewed with patient. Patient continues to work on fat intake in diet  eating healthy  Getting bk to reg exrcise after pacemaker insertion  Working with endocri on diaets      Review of Systems  Constitutional: Negative for activity change, appetite change and fever.  HENT: Negative for congestion and rhinorrhea.   Eyes: Negative for discharge.  Respiratory: Negative for cough and wheezing.     Cardiovascular: Negative for chest pain.  Gastrointestinal: Negative for abdominal pain, blood in stool and vomiting.  Genitourinary: Negative for difficulty urinating and frequency.  Musculoskeletal: Negative for neck pain.  Skin: Negative for rash.  Allergic/Immunologic: Negative for environmental allergies and food allergies.  Neurological: Negative for weakness and headaches.  Psychiatric/Behavioral: Negative for agitation.  All other systems reviewed and are negative.      Objective:   Physical Exam  Constitutional: He appears well-developed and well-nourished.  Obesity present  HENT:  Head: Normocephalic and atraumatic.  Right Ear: External ear normal.  Left Ear: External ear normal.  Nose: Nose normal.  Mouth/Throat: Oropharynx is clear and moist.  Eyes: Pupils are equal, round, and reactive to light. EOM are normal.  Neck: Normal range of motion. Neck supple. No thyromegaly present.  Cardiovascular: Normal rate, regular rhythm and normal heart sounds.   No murmur heard. Pulmonary/Chest: Effort normal and breath sounds normal. No respiratory distress. He has no wheezes.  Pacemaker present on chest wall within normal limits  Abdominal: Soft. Bowel sounds are normal. He exhibits no distension and no mass. There is no tenderness.  Genitourinary: Penis normal.  Musculoskeletal: Normal range of motion. He exhibits no edema.  Lymphadenopathy:    He has no cervical adenopathy.  Neurological: He is alert. He exhibits normal muscle tone.  Skin: Skin is warm and dry. No erythema.  Psychiatric: He has a normal mood and affect. His behavior is normal. Judgment normal.  Vitals reviewed.         Assessment & Plan:  Impression 1 wellness exam. Patient due colonoscopy discussed will work on setting up #2 hypertension good control discussed maintain same meds #3  hyperlipidemia/status uncertain. Flu shot. Appropriate blood work. Referral for colonoscopy

## 2016-12-13 ENCOUNTER — Encounter: Payer: Self-pay | Admitting: Family Medicine

## 2016-12-13 ENCOUNTER — Encounter (INDEPENDENT_AMBULATORY_CARE_PROVIDER_SITE_OTHER): Payer: Self-pay | Admitting: *Deleted

## 2016-12-17 LAB — CUP PACEART INCLINIC DEVICE CHECK
Brady Statistic AP VP Percent: 36 %
Brady Statistic AP VS Percent: 0 %
Brady Statistic AS VP Percent: 63 %
Brady Statistic AS VS Percent: 0 %
Implantable Lead Implant Date: 20170605
Implantable Lead Location: 753860
Implantable Lead Model: 5076
Lead Channel Impedance Value: 438 Ohm
Lead Channel Pacing Threshold Amplitude: 0.75 V
Lead Channel Pacing Threshold Amplitude: 0.75 V
Lead Channel Pacing Threshold Pulse Width: 0.4 ms
Lead Channel Sensing Intrinsic Amplitude: 4 mV
Lead Channel Setting Pacing Amplitude: 2 V
MDC IDC LEAD IMPLANT DT: 20170605
MDC IDC LEAD LOCATION: 753859
MDC IDC MSMT BATTERY IMPEDANCE: 100 Ohm
MDC IDC MSMT BATTERY REMAINING LONGEVITY: 131 mo
MDC IDC MSMT BATTERY VOLTAGE: 2.79 V
MDC IDC MSMT LEADCHNL RA IMPEDANCE VALUE: 548 Ohm
MDC IDC MSMT LEADCHNL RV PACING THRESHOLD PULSEWIDTH: 0.4 ms
MDC IDC PG IMPLANT DT: 20170605
MDC IDC SESS DTM: 20180907121007
MDC IDC SET LEADCHNL RV PACING AMPLITUDE: 2.5 V
MDC IDC SET LEADCHNL RV PACING PULSEWIDTH: 0.4 ms
MDC IDC SET LEADCHNL RV SENSING SENSITIVITY: 4 mV

## 2016-12-24 DIAGNOSIS — I10 Essential (primary) hypertension: Secondary | ICD-10-CM | POA: Diagnosis not present

## 2016-12-24 DIAGNOSIS — Z125 Encounter for screening for malignant neoplasm of prostate: Secondary | ICD-10-CM | POA: Diagnosis not present

## 2016-12-24 DIAGNOSIS — E785 Hyperlipidemia, unspecified: Secondary | ICD-10-CM | POA: Diagnosis not present

## 2016-12-25 LAB — LIPID PANEL
CHOLESTEROL TOTAL: 160 mg/dL (ref 100–199)
Chol/HDL Ratio: 2.6 ratio (ref 0.0–5.0)
HDL: 62 mg/dL (ref >39)
LDL Calculated: 53 mg/dL (ref 0–99)
TRIGLYCERIDES: 223 mg/dL — AB (ref 0–149)
VLDL Cholesterol Cal: 45 mg/dL — ABNORMAL HIGH (ref 5–40)

## 2016-12-25 LAB — HEPATIC FUNCTION PANEL
ALK PHOS: 62 IU/L (ref 39–117)
ALT: 23 IU/L (ref 0–44)
AST: 25 IU/L (ref 0–40)
Albumin: 4.4 g/dL (ref 3.5–4.8)
BILIRUBIN TOTAL: 1.1 mg/dL (ref 0.0–1.2)
BILIRUBIN, DIRECT: 0.23 mg/dL (ref 0.00–0.40)
Total Protein: 7.4 g/dL (ref 6.0–8.5)

## 2016-12-25 LAB — PSA: PROSTATE SPECIFIC AG, SERUM: 1.6 ng/mL (ref 0.0–4.0)

## 2016-12-26 ENCOUNTER — Encounter: Payer: Self-pay | Admitting: Family Medicine

## 2016-12-28 ENCOUNTER — Other Ambulatory Visit (INDEPENDENT_AMBULATORY_CARE_PROVIDER_SITE_OTHER): Payer: Self-pay | Admitting: *Deleted

## 2016-12-28 DIAGNOSIS — Z86010 Personal history of colonic polyps: Secondary | ICD-10-CM

## 2017-01-16 ENCOUNTER — Ambulatory Visit (INDEPENDENT_AMBULATORY_CARE_PROVIDER_SITE_OTHER): Payer: Medicare HMO | Admitting: *Deleted

## 2017-01-16 DIAGNOSIS — I442 Atrioventricular block, complete: Secondary | ICD-10-CM

## 2017-01-16 NOTE — Progress Notes (Signed)
Remote pacemaker transmission.   

## 2017-01-18 ENCOUNTER — Encounter: Payer: Self-pay | Admitting: Cardiology

## 2017-01-18 ENCOUNTER — Ambulatory Visit (INDEPENDENT_AMBULATORY_CARE_PROVIDER_SITE_OTHER): Payer: Medicare HMO | Admitting: Cardiovascular Disease

## 2017-01-18 ENCOUNTER — Encounter: Payer: Self-pay | Admitting: Cardiovascular Disease

## 2017-01-18 VITALS — BP 124/68 | HR 85 | Ht 71.0 in | Wt 257.0 lb

## 2017-01-18 DIAGNOSIS — I4819 Other persistent atrial fibrillation: Secondary | ICD-10-CM

## 2017-01-18 DIAGNOSIS — I10 Essential (primary) hypertension: Secondary | ICD-10-CM | POA: Diagnosis not present

## 2017-01-18 DIAGNOSIS — I350 Nonrheumatic aortic (valve) stenosis: Secondary | ICD-10-CM | POA: Diagnosis not present

## 2017-01-18 DIAGNOSIS — I481 Persistent atrial fibrillation: Secondary | ICD-10-CM | POA: Diagnosis not present

## 2017-01-18 DIAGNOSIS — I5022 Chronic systolic (congestive) heart failure: Secondary | ICD-10-CM

## 2017-01-18 DIAGNOSIS — Z950 Presence of cardiac pacemaker: Secondary | ICD-10-CM | POA: Diagnosis not present

## 2017-01-18 DIAGNOSIS — E7800 Pure hypercholesterolemia, unspecified: Secondary | ICD-10-CM | POA: Diagnosis not present

## 2017-01-18 LAB — CUP PACEART REMOTE DEVICE CHECK
Battery Impedance: 100 Ohm
Battery Remaining Longevity: 132 mo
Brady Statistic AP VP Percent: 16 %
Brady Statistic AS VP Percent: 83 %
Date Time Interrogation Session: 20181024093748
Implantable Lead Implant Date: 20170605
Implantable Lead Location: 753859
Implantable Lead Model: 5076
Implantable Lead Model: 5076
Implantable Pulse Generator Implant Date: 20170605
Lead Channel Impedance Value: 532 Ohm
Lead Channel Pacing Threshold Amplitude: 0.75 V
Lead Channel Pacing Threshold Amplitude: 0.75 V
Lead Channel Setting Pacing Amplitude: 2 V
Lead Channel Setting Pacing Amplitude: 2.5 V
Lead Channel Setting Sensing Sensitivity: 4 mV
MDC IDC LEAD IMPLANT DT: 20170605
MDC IDC LEAD LOCATION: 753860
MDC IDC MSMT BATTERY VOLTAGE: 2.79 V
MDC IDC MSMT LEADCHNL RA PACING THRESHOLD PULSEWIDTH: 0.4 ms
MDC IDC MSMT LEADCHNL RV IMPEDANCE VALUE: 438 Ohm
MDC IDC MSMT LEADCHNL RV PACING THRESHOLD PULSEWIDTH: 0.4 ms
MDC IDC SET LEADCHNL RV PACING PULSEWIDTH: 0.4 ms
MDC IDC STAT BRADY AP VS PERCENT: 0 %
MDC IDC STAT BRADY AS VS PERCENT: 1 %

## 2017-01-18 NOTE — Patient Instructions (Signed)
Your physician wants you to follow-up in:  1 year with Dr.Koneswaran You will receive a reminder letter in the mail two months in advance. If you don't receive a letter, please call our office to schedule the follow-up appointment.    Your physician recommends that you continue on your current medications as directed. Please refer to the Current Medication list given to you today.    If you need a refill on your cardiac medications before your next appointment, please call your pharmacy.      No lab work or tests ordered today.      Thank you for choosing [facility] !        

## 2017-01-18 NOTE — Progress Notes (Signed)
SUBJECTIVE: The patient presents for routine follow-up. He has a history of high-grade heart block status post permanent pacemaker insertion, atrial flutter status post ablation, and paroxysmal atrial fibrillation status post cardioversion on 03/23/16. He also has chronic systolic heart failure.  The patient denies any symptoms of chest pain, palpitations, shortness of breath, lightheadedness, dizziness, leg swelling, orthopnea, PND, and syncope.    Review of Systems: As per "subjective", otherwise negative.  No Known Allergies  Current Outpatient Prescriptions  Medication Sig Dispense Refill  . amLODipine (NORVASC) 10 MG tablet TAKE 1 TABLET (10 MG TOTAL) BY MOUTH DAILY. 90 tablet 1  . benazepril (LOTENSIN) 40 MG tablet Take 1 tablet (40 mg total) by mouth daily. 90 tablet 1  . carvedilol (COREG) 25 MG tablet TAKE 1 TABLET (25 MG TOTAL) BY MOUTH 2 (TWO) TIMES DAILY WITH A MEAL. 180 tablet 3  . ELIQUIS 5 MG TABS tablet TAKE 1 TABLET BY MOUTH 2 (TWO) TIMES DAILY. 180 tablet 6  . furosemide (LASIX) 40 MG tablet Take 1 tablet (40 mg total) by mouth 2 (two) times daily. 180 tablet 3  . Insulin Glargine (TOUJEO SOLOSTAR) 300 UNIT/ML SOPN Inject 45 Units into the skin daily.    . Magnesium 400 MG CAPS Take 1 tablet by mouth daily.     . Multiple Vitamins-Minerals (MULTIVITAMIN WITH MINERALS) tablet Take 1 tablet by mouth daily.    Marland Kitchen Resveratrol 100 MG CAPS Take 100 mg by mouth daily.     . rosuvastatin (CRESTOR) 20 MG tablet Take 1 tablet (20 mg total) by mouth daily. 90 tablet 1  . sitaGLIPtan-metformin (JANUMET) 50-1000 MG per tablet Take 1 tablet by mouth 2 (two) times daily with a meal.      . tadalafil (CIALIS) 20 MG tablet Take 1 tablet (20 mg total) by mouth daily as needed. For erectile dysfunction 10 tablet 6  . tamsulosin (FLOMAX) 0.4 MG CAPS capsule Take 1 capsule (0.4 mg total) by mouth at bedtime. 90 capsule 1  . TRULICITY 71 YO/3.7CH SOPN INJECT 0.75 MG INTO THE SKIN  ONCE A WEEK  3   No current facility-administered medications for this visit.     Past Medical History:  Diagnosis Date  . Atrial flutter ([place]) 12/2010   Admitted with symptomatic bradycardia (HR 40s), atrial flutter with slow ventricular response 12/2010 + volume overload; AV nodal agents d/c'd and Pradaxa started; RFA in 01/2011  . CHF (congestive heart failure) ([place])   . Chronic combined systolic and diastolic heart failure (HCC)    echo 01/06/11: mild LVH, EF 65-70%, mod to severe LAE, mild RVE, mild RAE, PASP 32;   TEE 10/12: EF 45-50%   . Diabetes mellitus    non insulin dependant  . Hyperlipidemia   . Hypertension   . Osteoarthritis   . PSVT (paroxysmal supraventricular tachycardia) (HCC)    Possibly atrial flutter    Past Surgical History:  Procedure Laterality Date  . ATRIAL FLUTTER ABLATION N/A 02/07/2011   Procedure: ATRIAL FLUTTER ABLATION;  Surgeon: Evans Lance, MD;  Location: [facility] CATH LAB;  Service: Cardiovascular;  Laterality: N/A;  . CARDIAC ELECTROPHYSIOLOGY STUDY AND ABLATION  02/07/11  . CARDIOVERSION N/A 03/23/2016   Procedure: CARDIOVERSION;  Surgeon: Evans Lance, MD;  Location: [place];  Service: Cardiovascular;  Laterality: N/A;  . EP IMPLANTABLE DEVICE N/A 08/29/2015   Procedure: Pacemaker Implant;  Surgeon: Evans Lance, MD;  Location: [place] CV LAB;  Service: Cardiovascular;  Laterality: N/A;  .  EP IMPLANTABLE DEVICE N/A 12/07/2015   Procedure: PPM Lead Revision/Repair;  Surgeon: Evans Lance, MD;  Location: [place] CV LAB;  Service: Cardiovascular;  Laterality: N/A;  . KNEE ARTHROSCOPY  2005   left  . LUMBAR SPINE SURGERY     "I've had 6 ORs 1972 thru 2004"    Social History   Social History  . Marital status: Married    Spouse name: N/A  . Number of children: N/A  . Years of education: N/A   Occupational History  . Designer, industrial/product Retired   Social History Main Topics  . Smoking status: Former Smoker    Packs/day:  1.00    Years: 10.00    Types: Cigarettes    Quit date: 03/26/1986  . Smokeless tobacco: Never Used     Comment: "stopped cigarette  smoking 1988"  . Alcohol use 0.0 oz/week     Comment: "quit alcohol ~ 2007"  . Drug use: No  . Sexual activity: Yes    Partners: Female   Other Topics Concern  . Not on file   Social History Narrative  . No narrative on file     Vitals:   01/18/17 0903  BP: 124/68  Pulse: 85  SpO2: 98%  Weight: 257 lb (116.6 kg)  Height: 5' 11"  (1.803 m)    Wt Readings from Last 3 Encounters:  01/18/17 257 lb (116.6 kg)  12/11/16 257 lb (116.6 kg)  11/30/16 257 lb (116.6 kg)     PHYSICAL EXAM General: NAD HEENT: Normal. Neck: No JVD, no thyromegaly. Lungs: Clear to auscultation bilaterally with normal respiratory effort. CV: Regular rate and rhythm, normal S1/S2, no S3/S4, no murmur. No pretibial or periankle edema.  No carotid bruit.   Abdomen: Soft, nontender, no distention.  Neurologic: Alert and oriented.  Psych: Normal affect. Skin: Normal. Musculoskeletal: No gross deformities.    ECG: Most recent ECG reviewed.   Labs: Lab Results  Component Value Date/Time   K 4.2 03/23/2016 07:11 AM   BUN 15 03/07/2016 10:43 AM   BUN 22 12/05/2015 08:38 AM   CREATININE 1.32 (H) 03/07/2016 10:43 AM   ALT 23 12/24/2016 08:19 AM   TSH 1.563 08/25/2015 10:39 AM   TSH 1.206 01/05/2011 03:46 PM   HGB 13.9 03/23/2016 07:11 AM     Lipids: Lab Results  Component Value Date/Time   LDLCALC 53 12/24/2016 08:19 AM   CHOL 160 12/24/2016 08:19 AM   TRIG 223 (H) 12/24/2016 08:19 AM   HDL 62 12/24/2016 08:19 AM       ASSESSMENT AND PLAN:  1. Persistent atrial fibrillation: Continue carvedilol and Eliquis. Symptomatically stable s/p DCCV in 02/2016.  2. Chronic systolic heart failure/cardiomyopathy: LVEF 40-45% on 11/22/15. Euvolemic on Lasix 40 mg twice daily. Continue carvedilol and benazepril.  3. Pacemaker s/p RA/RV lead revision 11/2015: Stable.  Normal device function. Continue follow up with EP.  4. Hypertension: Controlled on present therapy. No changes.  5. Hyperlipidemia: Continue statin.  6. Aortic stenosis: Mild in 10/2015. Stable.      Disposition: Follow up 1 year.   [name], M.D., F.A.C.C.

## 2017-02-09 LAB — HM DIABETES EYE EXAM

## 2017-02-20 ENCOUNTER — Other Ambulatory Visit: Payer: Self-pay | Admitting: Internal Medicine

## 2017-02-21 NOTE — Telephone Encounter (Signed)
This is Dr. Court Joy pt.

## 2017-02-28 ENCOUNTER — Telehealth (INDEPENDENT_AMBULATORY_CARE_PROVIDER_SITE_OTHER): Payer: Self-pay | Admitting: *Deleted

## 2017-02-28 ENCOUNTER — Telehealth: Payer: Self-pay | Admitting: Cardiovascular Disease

## 2017-02-28 ENCOUNTER — Encounter (INDEPENDENT_AMBULATORY_CARE_PROVIDER_SITE_OTHER): Payer: Self-pay | Admitting: *Deleted

## 2017-02-28 MED ORDER — PEG 3350-KCL-NA BICARB-NACL 420 G PO SOLR: 4000.0000 mL | Freq: Once | ORAL | 0 refills | Status: AC

## 2017-02-28 NOTE — Telephone Encounter (Signed)
Patient needs trilyte 

## 2017-02-28 NOTE — Telephone Encounter (Signed)
New message       [facility] Pre-operative Risk Assessment    Request for surgical clearance:  1. What type of surgery is being performed? colonoscopy  2. When is this surgery scheduled? 04/17/2017  3. Are there any medications that need to be held prior to surgery and how long? Eliquis 2 days prior  4. Practice name and name of physician performing surgery? Dr Laural Golden (on Epic)  5. What is your office phone and fax number? [phone], fax [phone]  6. Anesthesia type (None, local, MAC, general) ? general   Laurier Nancy 02/28/2017, 11:13 AM  _________________________________________________________________   (provider comments below)

## 2017-03-02 NOTE — Telephone Encounter (Addendum)
   Chart reviewed as part of pre-operative protocol coverage. Pt last seen by Dr. Bronson Ing 12/2016, with chronic systolic CHF, paroxysmal atrial fibrillation s/p DCCV 02/2016, atrial flutter s/p ablation, high grade heart block s/p pacemaker followed by EP. It is not clear from chart if CHF has been delineated as ischemic or nonischemic. Nuclear stress test in 08/2015 ordered by Tommye Standard was high risk due to low EF and demonstrated evidence of possible prior MI; no current myocardium at jeopardy, fixed defects without peri-infarct ischemia.Reubin Milan find prior cath on file. As I do not follow this patient chronically, I cannot fully assess his revised cardiac risk index score. I will forward to Dr. Bronson Ing for further input on his perceived risk for colonoscopy. Jamesetta So, please route reply to P CV DIV PREOP, thanks.  Will also forward to pharmD for input on Eliquis in the interim.   Charlie Pitter, PA-C 03/02/2017, 4:29 PM

## 2017-03-03 NOTE — Telephone Encounter (Signed)
Based on the nature of the procedure, the attendant cardiac risk is low for a major adverse cardiac event.

## 2017-03-04 NOTE — Telephone Encounter (Signed)
Patient with diagnosis of atrial fibrillation on Eliquis for anticoagulation.    Procedure: Colonoscopy Date of procedure: 04/17/2017  CHADS2-VASc score of  4 (CHF, HTN, AGE (65-74), DM2)  CrCl = 58 ml/min (using IBW)   Per office protocol, patient can hold Eliquis for 2 days prior to procedure.  Patient should restart Eliquis on the evening of procedure or day after, at discretion of procedure MD  Jameika Kinn Rodriguez-Guzman PharmD, BCPS, [place] [address] [place] [address] 03/04/2017 1:15 PM

## 2017-03-06 NOTE — Telephone Encounter (Signed)
   Primary Cardiologist: Dr. Bronson Ing  Chart reviewed as part of pre-operative protocol coverage. John [name] has been cleared by Dr. Bronson Ing to proceed with his planned colonoscopy. Can hold Eliquis 2 days prior to the procedure and restart Eliquis on the evening of the procedure or the day after at the discretion of the procedure MD.   I will route this recommendation to the requesting party via [facility] fax function and remove from pre-op pool.  Please call with questions.  Erma Heritage, PA-C 03/06/2017, 2:49 PM

## 2017-03-10 NOTE — Telephone Encounter (Signed)
Cardiology recommendations noted.

## 2017-03-11 DIAGNOSIS — I10 Essential (primary) hypertension: Secondary | ICD-10-CM | POA: Diagnosis not present

## 2017-03-11 DIAGNOSIS — E1165 Type 2 diabetes mellitus with hyperglycemia: Secondary | ICD-10-CM | POA: Diagnosis not present

## 2017-03-11 DIAGNOSIS — I509 Heart failure, unspecified: Secondary | ICD-10-CM | POA: Diagnosis not present

## 2017-03-11 DIAGNOSIS — E7800 Pure hypercholesterolemia, unspecified: Secondary | ICD-10-CM | POA: Diagnosis not present

## 2017-03-20 ENCOUNTER — Telehealth (INDEPENDENT_AMBULATORY_CARE_PROVIDER_SITE_OTHER): Payer: Self-pay | Admitting: *Deleted

## 2017-03-20 NOTE — Telephone Encounter (Signed)
agree

## 2017-03-20 NOTE — Telephone Encounter (Signed)
Referring MD/PCP: steve luking   Procedure: tcs  Reason/Indication:  Hx polyps  Has patient had this procedure before?  Yes, 2012   If so, when, by whom and where?    Is there a family history of colon cancer?  no  Who?  What age when diagnosed?    Is patient diabetic?   yes      Does patient have prosthetic heart valve or mechanical valve?  no  Do you have a pacemaker?  yes  Has patient ever had endocarditis? no  Has patient had joint replacement within last 12 months?  no  Is patient constipated or take laxatives? no  Does patient have a history of alcohol/drug use?  no  Is patient on Coumadin, Plavix and/or Aspirin? yes  Medications: see epic  Allergies: nkda  Medication Adjustment per Dr Laural Golden: eliquis 2 days, hold janumet day before, decrease tresiba to 20 units day before, no change to trulicity  Procedure date & time: 04/17/17 at 830

## 2017-04-08 ENCOUNTER — Other Ambulatory Visit: Payer: Self-pay | Admitting: Family Medicine

## 2017-04-11 ENCOUNTER — Ambulatory Visit (INDEPENDENT_AMBULATORY_CARE_PROVIDER_SITE_OTHER): Payer: Medicare HMO | Admitting: Family Medicine

## 2017-04-11 VITALS — BP 130/72 | Ht 71.0 in | Wt 259.0 lb

## 2017-04-11 DIAGNOSIS — E119 Type 2 diabetes mellitus without complications: Secondary | ICD-10-CM | POA: Diagnosis not present

## 2017-04-11 LAB — POCT GLYCOSYLATED HEMOGLOBIN (HGB A1C): HEMOGLOBIN A1C: 8.6

## 2017-04-11 NOTE — Progress Notes (Addendum)
   Subjective:    Patient ID: John [name], male    DOB: July 21, 1945, 72 y.o.   MRN: [id number]  Diabetes  He presents for his initial (pt's diabetic specialist is retiring) diabetic visit. He is compliant with treatment all of the time. He is following a generally healthy diet. Exercise: walks every day. Home blood sugar record trend: 130 -145. He does not see a podiatrist.Eye exam is current.   Results for orders placed or performed in visit on 04/11/17  POCT glycosylated hemoglobin (Hb A1C)  Result Value Ref Range   Hemoglobin A1C 8.6   HM DIABETES EYE EXAM  Result Value Ref Range   HM Diabetic Eye Exam Retinopathy (A) No Retinopathy   A1c around 8 or so in the past  Patient's endocrinologist left has not gotten a new one yet.  Patient's local [facility] declined to fill Trulicity prescription.  They claim it is a "duplicate" patient not sure whether this means therapeutically or insurance speaking  No headache, no major weight loss or weight gain, no chest pain no back pain abdominal pain no change in bowel habits complete ROS otherwise negative     Objective:   Physical Exam  Alert vitals stable, NAD. Blood pressure good on repeat. HEENT normal. Lungs clear. Heart regular rate and rhythm.       Assessment & Plan:  Impression 1 type 2 diabetes.  Suboptimal control.  Discussed at length.  I still feel he needs an endocrinologist.  We will help with this referral.  In the meantime will contact patient's pharmacy and see why they have declined medication Discussion held.  Some of these medications I do not prescribed to patient.  Up-to-date research done in presence of patient.  It is true that though Januvia is somewhat similar to Trulicity, there is enough of a similar activity when patient is on Trulicity.  He should come off the Januvia.  We will maintain the metformin part of the [place].  We will maintain Trulicity.  Patient may need to have long-acting insulin bumped up.   Substantial discussion held.   Greater than 50% of this 25 minute face to face visit was spent in counseling and discussion and coordination of care regarding the above diagnosis/diagnosies

## 2017-04-12 ENCOUNTER — Encounter: Payer: Self-pay | Admitting: Family Medicine

## 2017-04-12 ENCOUNTER — Telehealth: Payer: Self-pay | Admitting: *Deleted

## 2017-04-12 MED ORDER — METFORMIN HCL 1000 MG PO TABS: 1000.0000 mg | ORAL_TABLET | Freq: Two times a day (BID) | ORAL | 3 refills | Status: DC

## 2017-04-12 NOTE — Addendum Note (Signed)
Addended by: Dairl Ponder on: 04/12/2017 01:22 PM   Modules accepted: Orders

## 2017-04-12 NOTE — Telephone Encounter (Signed)
Results discussed with patient. [facility] advised [facility] was refusing to fill trulicity because on janumet, even tho pt had received both for several months.Tell pt Dr Richardson Landry researched this. Most experts state that the [place] component of janumet is so similar to trulicity in its effects that they rec when trulicity is started (which is stronger significantly than [place]), the [place] portion should be stopped. So, we need to switch from janumet to 1000 mg plain metformin bid. Resume trulicity (missed this past Tuesday's dose). If sugars bump up significantly call us and w e may need to adjust long acting insulin. Patient verbalized understanding. Prescription sent electronically to pharmacy

## 2017-04-12 NOTE — Addendum Note (Signed)
Addended by: Mikey Kirschner on: 04/12/2017 12:59 PM   Modules accepted: Orders

## 2017-04-12 NOTE — Telephone Encounter (Signed)
[facility] to [facility] tr[facility]icity because on janumet, even tho pt had received both for several months.Tell pt I researched this. Most experts state that the [place] component of janumet is so similar to trulicity in its effects that they rec when trulicity is started (which is stronger significantly than [place]), the [place] portion should be stopped. So, we need to switch from janumet to 1000 mg plain metformin bid. Resume trulicity (missed this past Tuesday's dose). If sugars bump up significantly call us and w e may need to adjust long acting insulin

## 2017-04-12 NOTE — Telephone Encounter (Signed)
Patient seen 04/11/17

## 2017-04-17 ENCOUNTER — Other Ambulatory Visit: Payer: Self-pay

## 2017-04-17 ENCOUNTER — Encounter (HOSPITAL_COMMUNITY): Payer: Self-pay | Admitting: *Deleted

## 2017-04-17 ENCOUNTER — Ambulatory Visit (HOSPITAL_COMMUNITY)
Admission: RE | Admit: 2017-04-17 | Discharge: 2017-04-17 | Disposition: A | Discharge status: Home / Self Care | Payer: Medicare HMO | Source: Ambulatory Visit | Attending: Internal Medicine | Admitting: Internal Medicine

## 2017-04-17 ENCOUNTER — Ambulatory Visit (INDEPENDENT_AMBULATORY_CARE_PROVIDER_SITE_OTHER): Payer: Medicare HMO | Admitting: *Deleted

## 2017-04-17 ENCOUNTER — Encounter (HOSPITAL_COMMUNITY)
Admission: RE | Disposition: A | Discharge status: Home / Self Care | Payer: Self-pay | Source: Ambulatory Visit | Attending: Internal Medicine

## 2017-04-17 DIAGNOSIS — I110 Hypertensive heart disease with heart failure: Secondary | ICD-10-CM | POA: Insufficient documentation

## 2017-04-17 DIAGNOSIS — Z8249 Family history of ischemic heart disease and other diseases of the circulatory system: Secondary | ICD-10-CM | POA: Insufficient documentation

## 2017-04-17 DIAGNOSIS — Z1211 Encounter for screening for malignant neoplasm of colon: Secondary | ICD-10-CM | POA: Diagnosis not present

## 2017-04-17 DIAGNOSIS — Z86010 Personal history of colonic polyps: Secondary | ICD-10-CM | POA: Insufficient documentation

## 2017-04-17 DIAGNOSIS — Z950 Presence of cardiac pacemaker: Secondary | ICD-10-CM | POA: Insufficient documentation

## 2017-04-17 DIAGNOSIS — K644 Residual hemorrhoidal skin tags: Secondary | ICD-10-CM | POA: Diagnosis not present

## 2017-04-17 DIAGNOSIS — I471 Supraventricular tachycardia: Secondary | ICD-10-CM | POA: Insufficient documentation

## 2017-04-17 DIAGNOSIS — Z87891 Personal history of nicotine dependence: Secondary | ICD-10-CM | POA: Diagnosis not present

## 2017-04-17 DIAGNOSIS — Z09 Encounter for follow-up examination after completed treatment for conditions other than malignant neoplasm: Secondary | ICD-10-CM | POA: Diagnosis not present

## 2017-04-17 DIAGNOSIS — Z7984 Long term (current) use of oral hypoglycemic drugs: Secondary | ICD-10-CM | POA: Diagnosis not present

## 2017-04-17 DIAGNOSIS — D123 Benign neoplasm of transverse colon: Secondary | ICD-10-CM | POA: Diagnosis not present

## 2017-04-17 DIAGNOSIS — I442 Atrioventricular block, complete: Secondary | ICD-10-CM | POA: Diagnosis not present

## 2017-04-17 DIAGNOSIS — K5730 Diverticulosis of large intestine without perforation or abscess without bleeding: Secondary | ICD-10-CM | POA: Diagnosis not present

## 2017-04-17 DIAGNOSIS — I5042 Chronic combined systolic (congestive) and diastolic (congestive) heart failure: Secondary | ICD-10-CM | POA: Insufficient documentation

## 2017-04-17 DIAGNOSIS — Z79899 Other long term (current) drug therapy: Secondary | ICD-10-CM | POA: Diagnosis not present

## 2017-04-17 HISTORY — PX: COLONOSCOPY: SHX5424

## 2017-04-17 HISTORY — DX: Presence of cardiac pacemaker: Z95.0

## 2017-04-17 HISTORY — PX: POLYPECTOMY: SHX5525

## 2017-04-17 LAB — GLUCOSE, CAPILLARY: Glucose-Capillary: 170 mg/dL — ABNORMAL HIGH (ref 65–99)

## 2017-04-17 SURGERY — COLONOSCOPY
Anesthesia: Moderate Sedation

## 2017-04-17 MED ORDER — MIDAZOLAM HCL 5 MG/5ML IJ SOLN
INTRAMUSCULAR | Status: AC
  Filled 2017-04-17: qty 10

## 2017-04-17 MED ORDER — APIXABAN 5 MG PO TABS: ORAL_TABLET | ORAL | 6 refills | Status: DC

## 2017-04-17 MED ORDER — MIDAZOLAM HCL 5 MG/5ML IJ SOLN
INTRAMUSCULAR | Status: DC | PRN
  Administered 2017-04-17 (×2): 2 mg via INTRAVENOUS

## 2017-04-17 MED ORDER — SODIUM CHLORIDE 0.9 % IV SOLN
INTRAVENOUS | Status: DC
  Administered 2017-04-17: 08:00:00 via INTRAVENOUS

## 2017-04-17 MED ORDER — MEPERIDINE HCL 50 MG/ML IJ SOLN
INTRAMUSCULAR | Status: AC
  Filled 2017-04-17: qty 1

## 2017-04-17 MED ORDER — MEPERIDINE HCL 50 MG/ML IJ SOLN
INTRAMUSCULAR | Status: DC | PRN
  Administered 2017-04-17: 25 mg via INTRAVENOUS

## 2017-04-17 NOTE — H&P (Signed)
John [name] is an 72 y.o. male.   Chief Complaint: Patient is here for colonoscopy. HPI: Patient is 72 year old African male who has a history of colonic adenoma and is here for surveillance colonoscopy.  He denies abdominal pain change in bowel habits or rectal bleeding. Last colonoscopy was in February 2012 with removal of 4 small polyps and only one was tubular adenoma.  He was advised to return for follow-up exam in 7 years. Family history is negative for CRC. Patient has been off Eliquis for 2 days.  Past Medical History:  Diagnosis Date  . Atrial flutter ([place]) 12/2010   Admitted with symptomatic bradycardia (HR 40s), atrial flutter with slow ventricular response 12/2010 + volume overload; AV nodal agents d/c'd and Pradaxa started; RFA in 01/2011  . CHF (congestive heart failure) ([place])   . Chronic combined systolic and diastolic heart failure (HCC)    echo 01/06/11: mild LVH, EF 65-70%, mod to severe LAE, mild RVE, mild RAE, PASP 32;   TEE 10/12: EF 45-50%   . Diabetes mellitus    non insulin dependant  . Hyperlipidemia   . Hypertension   . Osteoarthritis   . Presence of permanent cardiac pacemaker   . PSVT (paroxysmal supraventricular tachycardia) (HCC)    Possibly atrial flutter    Past Surgical History:  Procedure Laterality Date  . ATRIAL FLUTTER ABLATION N/A 02/07/2011   Procedure: ATRIAL FLUTTER ABLATION;  Surgeon: Evans Lance, MD;  Location: [facility] CATH LAB;  Service: Cardiovascular;  Laterality: N/A;  . CARDIAC ELECTROPHYSIOLOGY STUDY AND ABLATION  02/07/11  . CARDIOVERSION N/A 03/23/2016   Procedure: CARDIOVERSION;  Surgeon: Evans Lance, MD;  Location: [place];  Service: Cardiovascular;  Laterality: N/A;  . EP IMPLANTABLE DEVICE N/A 08/29/2015   Procedure: Pacemaker Implant;  Surgeon: Evans Lance, MD;  Location: [place] CV LAB;  Service: Cardiovascular;  Laterality: N/A;  . EP IMPLANTABLE DEVICE N/A 12/07/2015   Procedure: PPM Lead Revision/Repair;   Surgeon: Evans Lance, MD;  Location: [place] CV LAB;  Service: Cardiovascular;  Laterality: N/A;  . KNEE ARTHROSCOPY  2005   left  . LUMBAR SPINE SURGERY     "I've had 6 ORs 1972 thru 2004"    Family History  Problem Relation Age of Onset  . Heart attack Mother   . Hypertension Mother   . Heart attack Father   . Hypertension Father   . Heart attack Brother   . Colon cancer Neg Hx    Social History:  reports that he quit smoking about 31 years ago. His smoking use included cigarettes. He has a 10.00 pack-year smoking history. he has never used smokeless tobacco. He reports that he drinks alcohol. He reports that he does not use drugs.  Allergies: No Known Allergies  Medications Prior to Admission  Medication Sig Dispense Refill  . amLODipine (NORVASC) 10 MG tablet TAKE 1 TABLET (10 MG TOTAL) BY MOUTH DAILY. 30 tablet 5  . benazepril (LOTENSIN) 40 MG tablet Take 1 tablet (40 mg total) by mouth daily. 90 tablet 1  . carvedilol (COREG) 25 MG tablet TAKE 1 TABLET (25 MG TOTAL) BY MOUTH 2 (TWO) TIMES DAILY WITH A MEAL. 180 tablet 3  . ELIQUIS 5 MG TABS tablet TAKE 1 TABLET BY MOUTH 2 (TWO) TIMES DAILY. (Patient taking differently: TAKE 5 MG BY MOUTH 2 (TWO) TIMES DAILY.) 180 tablet 6  . furosemide (LASIX) 40 MG tablet TAKE 1 TABLET TWICE DAILY (Patient taking differently: Take 40 mg  by mouth twice daily) 180 tablet 3  . GAVILYTE-N WITH FLAVOR PACK 420 g solution Take 4,000 mLs by mouth once.  0  . Insulin Degludec (TRESIBA FLEXTOUCH) 200 UNIT/ML SOPN Inject 60 Units into the skin daily.    . Magnesium 400 MG CAPS Take 400 mg by mouth daily.     . Multiple Vitamins-Minerals (MULTIVITAMIN WITH MINERALS) tablet Take 1 tablet by mouth daily.    Marland Kitchen Resveratrol 100 MG CAPS Take 100 mg by mouth daily.     . rosuvastatin (CRESTOR) 20 MG tablet Take 1 tablet (20 mg total) by mouth daily. 90 tablet 1  . tamsulosin (FLOMAX) 0.4 MG CAPS capsule Take 1 capsule (0.4 mg total) by mouth at bedtime.  90 capsule 1  . metFORMIN (GLUCOPHAGE) 1000 MG tablet Take 1 tablet (1,000 mg total) by mouth 2 (two) times daily with a meal. 60 tablet 3  . tadalafil (CIALIS) 20 MG tablet Take 1 tablet (20 mg total) by mouth daily as needed. For erectile dysfunction 10 tablet 6  . TRULICITY 1.5 AO/1.3YQ SOPN       Results for orders placed or performed during the hospital encounter of 04/17/17 (from the past 48 hour(s))  Glucose, capillary     Status: Abnormal   Collection Time: 04/17/17  7:52 AM  Result Value Ref Range   Glucose-Capillary 170 (H) 65 - 99 mg/dL   No results found.  ROS  Blood pressure 135/75, pulse 70, temperature 97.8 F (36.6 C), temperature source Oral, resp. rate 14, height 5' 11"  (1.803 m), weight 259 lb (117.5 kg), SpO2 97 %. Physical Exam  Constitutional: He appears well-developed and well-nourished.  HENT:  Mouth/Throat: Oropharynx is clear and moist.  Eyes: Conjunctivae are normal. No scleral icterus.  Neck: No thyromegaly present.  Cardiovascular: Normal rate, regular rhythm and normal heart sounds.  No murmur heard. Respiratory: Effort normal and breath sounds normal.  GI:  Abdomen is full.  It is soft and nontender without organomegaly or masses.  Musculoskeletal: He exhibits no edema.  Lymphadenopathy:    He has no cervical adenopathy.  Neurological: He is alert.  Skin: Skin is warm and dry.     Assessment/Plan History of colonic adenoma. Surveillance colonoscopy.  Hildred Laser, MD 04/17/2017, 8:30 AM

## 2017-04-17 NOTE — Progress Notes (Signed)
Remote pacemaker transmission.   

## 2017-04-17 NOTE — Op Note (Signed)
[facility] Patient Name: John [name] Procedure Date: 04/17/2017 8:20 AM MRN: [id number] Date of Birth: 1945-10-29 Attending MD: Hildred Laser , MD CSN: [id number] Age: 72 Admit Type: Outpatient Procedure:                Colonoscopy Indications:              High risk colon cancer surveillance: Personal                            history of colonic polyps Providers:                Hildred Laser, MD, Lurline Del, RN, Nelma Rothman,                            Technician Referring MD:             Grace Bushy. Wolfgang Phoenix, MD Medicines:                Meperidine 50 mg IV, Midazolam 5 mg IV Complications:            No immediate complications. Estimated Blood Loss:     Estimated blood loss was minimal. Procedure:                Pre-Anesthesia Assessment:                           - Prior to the procedure, a History and Physical                            was performed, and patient medications and                            allergies were reviewed. The patient's tolerance of                            previous anesthesia was also reviewed. The risks                            and benefits of the procedure and the sedation                            options and risks were discussed with the patient.                            All questions were answered, and informed consent                            was obtained. Prior Anticoagulants: The patient                            last took Eliquis (apixaban) 3 days prior to the                            procedure. ASA Grade Assessment: III - A patient  with severe systemic disease. After reviewing the                            risks and benefits, the patient was deemed in                            satisfactory condition to undergo the procedure.                           After obtaining informed consent, the colonoscope                            was passed under direct vision. Throughout the   procedure, the patient's blood pressure, pulse, and                            oxygen saturations were monitored continuously. The                            EC-[id number] ([id number]) scope was introduced through                            the anus and advanced to the the cecum, identified                            by appendiceal orifice and ileocecal valve. The                            colonoscopy was performed without difficulty. The                            patient tolerated the procedure well. The quality                            of the bowel preparation was adequate. The                            ileocecal valve and appendiceal orifice were                            photographed. Scope In: 8:40:20 AM Scope Out: 9:03:47 AM Scope Withdrawal Time: 0 hours 19 minutes 11 seconds  Total Procedure Duration: 0 hours 23 minutes 27 seconds  Findings:      The perianal and digital rectal examinations were normal.      Multiple small and large-mouthed diverticula were found in the entire       colon.      Two sessile polyps were found in the transverse colon. The polyps were       small in size. These were biopsied with a cold forceps for histology.       The pathology specimen was placed into Bottle Number 1.      A 6 mm polyp was found in the transverse colon. The polyp was       semi-sessile. The polyp was removed with a cold snare. Resection and  retrieval were complete. The pathology specimen was placed into Bottle       Number 1.      External hemorrhoids were found during retroflexion. The hemorrhoids       were small. Impression:               - Diverticulosis in the entire examined colon.                           - Two small polyps in the transverse colon.                            Biopsied.                           - One 6 mm polyp in the transverse colon, removed                            with a cold snare. Resected and retrieved.                           - External  hemorrhoids. Moderate Sedation:      Moderate (conscious) sedation was administered by the endoscopy nurse       and supervised by the endoscopist. The following parameters were       monitored: oxygen saturation, heart rate, blood pressure, CO2       capnography and response to care. Total physician intraservice time was       28 minutes. Recommendation:           - Patient has a contact number available for                            emergencies. The signs and symptoms of potential                            delayed complications were discussed with the                            patient. Return to normal activities tomorrow.                            Written discharge instructions were provided to the                            patient.                           - High fiber diet and diabetic (ADA) diet today.                           - Continue present medications.                           - Resume Eliquis (apixaban) at prior dose tomorrow.                           - Await  pathology results.                           - Repeat colonoscopy for surveillance based on                            pathology results. Procedure Code(s):        --- Professional ---                           [id number], Colonoscopy, flexible; with removal of                            tumor(s), polyp(s), or other lesion(s) by snare                            technique                           45380, 59, Colonoscopy, flexible; with biopsy,                            single or multiple                           99152, Moderate sedation services provided by the                            same physician or other qualified health care                            professional performing the diagnostic or                            therapeutic service that the sedation supports,                            requiring the presence of an independent trained                            observer to assist in the monitoring of  the                            patient's level of consciousness and physiological                            status; initial 15 minutes of intraservice time,                            patient age 76 years or older                           [id number], Moderate sedation services; each additional                            15 minutes intraservice time Diagnosis Code(s):        ---  Professional ---                           D12.3, Benign neoplasm of transverse colon (hepatic                            flexure or splenic flexure)                           Z86.010, Personal history of colonic polyps                           K64.4, Residual hemorrhoidal skin tags                           K57.30, Diverticulosis of large intestine without                            perforation or abscess without bleeding CPT copyright 2016 American Medical Association. All rights reserved. The codes documented in this report are preliminary and upon coder review may  be revised to meet current compliance requirements. Hildred Laser, MD Hildred Laser, MD 04/17/2017 9:12:13 AM This report has been signed electronically. Number of Addenda: 0

## 2017-04-17 NOTE — Discharge Instructions (Addendum)
Colonoscopy, Adult, Care After This sheet gives you information about how to care for yourself after your procedure. Your doctor may also give you more specific instructions. If you have problems or questions, call your doctor. Follow these instructions at home: General instructions   For the first 24 hours after the procedure: ? Do not drive or use machinery. ? Do not sign important documents. ? Do not drink alcohol. ? Do your daily activities more slowly than normal. ? Eat foods that are soft and easy to digest. ? Rest often.  Take over-the-counter or prescription medicines only as told by your doctor.  It is up to you to get the results of your procedure. Ask your doctor, or the department performing the procedure, when your results will be ready. To help cramping and bloating:  Try walking around.  Put heat on your belly (abdomen) as told by your doctor. Use a heat source that your doctor recommends, such as a moist heat pack or a heating pad. ? Put a towel between your skin and the heat source. ? Leave the heat on for 20-30 minutes. ? Remove the heat if your skin turns bright red. This is especially important if you cannot feel pain, heat, or cold. You can get burned. Eating and drinking  Drink enough fluid to keep your pee (urine) clear or pale yellow.  Return to your normal diet as told by your doctor. Avoid heavy or fried foods that are hard to digest.  Avoid drinking alcohol for as long as told by your doctor. Contact a doctor if:  You have blood in your poop (stool) 2-3 days after the procedure. Get help right away if:  You have more than a small amount of blood in your poop.  You see large clumps of tissue (blood clots) in your poop.  Your belly is swollen.  You feel sick to your stomach (nauseous).  You throw up (vomit).  You have a fever.  You have belly pain that gets worse, and medicine does not help your pain. This information is not intended to  replace advice given to you by your health care provider. Make sure you discuss any questions you have with your health care provider. Document Released: 04/14/2010 Document Revised: 12/05/2015 Document Reviewed: 12/05/2015 Elsevier Interactive Patient Education  2017 [employer]. Colon Polyps Polyps are tissue growths inside the body. Polyps can grow in many places, including the large intestine (colon). A polyp may be a round bump or a mushroom-shaped growth. You could have one polyp or several. Most colon polyps are noncancerous (benign). However, some colon polyps can become cancerous over time. What are the causes? The exact cause of colon polyps is not known. What increases the risk? This condition is more likely to develop in people who:  Have a family history of colon cancer or colon polyps.  Are older than 79 or older than 45 if they are African American.  Have inflammatory bowel disease, such as ulcerative colitis or Crohn disease.  Are overweight.  Smoke cigarettes.  Do not get enough exercise.  Drink too much alcohol.  Eat a diet that is: ? High in fat and red meat. ? Low in fiber.  Had childhood cancer that was treated with abdominal radiation.  What are the signs or symptoms? Most polyps do not cause symptoms. If you have symptoms, they may include:  Blood coming from your rectum when having a bowel movement.  Blood in your stool.The stool may look dark red  or black.  A change in bowel habits, such as constipation or diarrhea.  How is this diagnosed? This condition is diagnosed with a colonoscopy. This is a procedure that uses a lighted, flexible scope to look at the inside of your colon. How is this treated? Treatment for this condition involves removing any polyps that are found. Those polyps will then be tested for cancer. If cancer is found, your health care provider will talk to you about options for colon cancer treatment. Follow these instructions  at home: Diet  Eat plenty of fiber, such as fruits, vegetables, and whole grains.  Eat foods that are high in calcium and vitamin D, such as milk, cheese, yogurt, eggs, liver, fish, and broccoli.  Limit foods high in fat, red meats, and processed meats, such as hot dogs, sausage, bacon, and lunch meats.  Maintain a healthy weight, or lose weight if recommended by your health care provider. General instructions  Do not smoke cigarettes.  Do not drink alcohol excessively.  Keep all follow-up visits as told by your health care provider. This is important. This includes keeping regularly scheduled colonoscopies. Talk to your health care provider about when you need a colonoscopy.  Exercise every day or as told by your health care provider. Contact a health care provider if:  You have new or worsening bleeding during a bowel movement.  You have new or increased blood in your stool.  You have a change in bowel habits.  You unexpectedly lose weight. This information is not intended to replace advice given to you by your health care provider. Make sure you discuss any questions you have with your health care provider. Document Released: 12/07/2003 Document Revised: 08/18/2015 Document Reviewed: 01/31/2015 Elsevier Interactive Patient Education  [employer]. Diverticulosis Diverticulosis is a condition that develops when small pouches (diverticula) form in the wall of the large intestine (colon). The colon is where water is absorbed and stool is formed. The pouches form when the inside layer of the colon pushes through weak spots in the outer layers of the colon. You may have a few pouches or many of them. What are the causes? The cause of this condition is not known. What increases the risk? The following factors may make you more likely to develop this condition:  Being older than age 47. Your risk for this condition increases with age. Diverticulosis is rare among people  younger than age 3. By age 46, many people have it.  Eating a low-fiber diet.  Having frequent constipation.  Being overweight.  Not getting enough exercise.  Smoking.  Taking over-the-counter pain medicines, like aspirin and ibuprofen.  Having a family history of diverticulosis.  What are the signs or symptoms? In most people, there are no symptoms of this condition. If you do have symptoms, they may include:  Bloating.  Cramps in the abdomen.  Constipation or diarrhea.  Pain in the lower left side of the abdomen.  How is this diagnosed? This condition is most often diagnosed during an exam for other colon problems. Because diverticulosis usually has no symptoms, it often cannot be diagnosed independently. This condition may be diagnosed by:  Using a flexible scope to examine the colon (colonoscopy).  Taking an X-ray of the colon after dye has been put into the colon (barium enema).  Doing a CT scan.  How is this treated? You may not need treatment for this condition if you have never developed an infection related to diverticulosis. If you have had  an infection before, treatment may include:  Eating a high-fiber diet. This may include eating more fruits, vegetables, and grains.  Taking a fiber supplement.  Taking a live bacteria supplement (probiotic).  Taking medicine to relax your colon.  Taking antibiotic medicines.  Follow these instructions at home:  Drink 6-8 glasses of water or more each day to prevent constipation.  Try not to strain when you have a bowel movement.  If you have had an infection before: ? Eat more fiber as directed by your health care provider or your diet and nutrition specialist (dietitian). ? Take a fiber supplement or probiotic, if your health care provider approves.  Take over-the-counter and prescription medicines only as told by your health care provider.  If you were prescribed an antibiotic, take it as told by your  health care provider. Do not stop taking the antibiotic even if you start to feel better.  Keep all follow-up visits as told by your health care provider. This is important. Contact a health care provider if:  You have pain in your abdomen.  You have bloating.  You have cramps.  You have not had a bowel movement in 3 days. Get help right away if:  Your pain gets worse.  Your bloating becomes very bad.  You have a fever or chills, and your symptoms suddenly get worse.  You vomit.  You have bowel movements that are bloody or black.  You have bleeding from your rectum. Summary  Diverticulosis is a condition that develops when small pouches (diverticula) form in the wall of the large intestine (colon).  You may have a few pouches or many of them.  This condition is most often diagnosed during an exam for other colon problems.  If you have had an infection related to diverticulosis, treatment may include increasing the fiber in your diet, taking supplements, or taking medicines. This information is not intended to replace advice given to you by your health care provider. Make sure you discuss any questions you have with your health care provider. Document Released: 12/08/2003 Document Revised: 01/30/2016 Document Reviewed: 01/30/2016 Elsevier Interactive Patient Education  2017 [employer]. Resume Eliquis on 04/18/2017. Resume other medications as before. High-fiber diabetic diet as before. No driving for 24 hours. Physician will call with biopsy results.

## 2017-04-18 ENCOUNTER — Encounter: Payer: Self-pay | Admitting: Cardiology

## 2017-04-19 ENCOUNTER — Encounter (HOSPITAL_COMMUNITY): Payer: Self-pay | Admitting: Internal Medicine

## 2017-05-07 LAB — CUP PACEART REMOTE DEVICE CHECK
Brady Statistic AP VP Percent: 13 %
Brady Statistic AP VS Percent: 0 %
Brady Statistic AS VS Percent: 1 %
Date Time Interrogation Session: 20190123120133
Implantable Lead Implant Date: 20170605
Lead Channel Impedance Value: 428 Ohm
Lead Channel Impedance Value: 524 Ohm
Lead Channel Pacing Threshold Amplitude: 0.875 V
Lead Channel Pacing Threshold Pulse Width: 0.4 ms
Lead Channel Setting Sensing Sensitivity: 4 mV
MDC IDC LEAD IMPLANT DT: 20170605
MDC IDC LEAD LOCATION: 753859
MDC IDC LEAD LOCATION: 753860
MDC IDC MSMT BATTERY IMPEDANCE: 111 Ohm
MDC IDC MSMT BATTERY REMAINING LONGEVITY: 127 mo
MDC IDC MSMT BATTERY VOLTAGE: 2.79 V
MDC IDC MSMT LEADCHNL RA PACING THRESHOLD AMPLITUDE: 0.625 V
MDC IDC MSMT LEADCHNL RV PACING THRESHOLD PULSEWIDTH: 0.4 ms
MDC IDC PG IMPLANT DT: 20170605
MDC IDC SET LEADCHNL RA PACING AMPLITUDE: 2 V
MDC IDC SET LEADCHNL RV PACING AMPLITUDE: 2.5 V
MDC IDC SET LEADCHNL RV PACING PULSEWIDTH: 0.4 ms
MDC IDC STAT BRADY AS VP PERCENT: 86 %

## 2017-05-08 ENCOUNTER — Ambulatory Visit (INDEPENDENT_AMBULATORY_CARE_PROVIDER_SITE_OTHER): Payer: Medicare HMO | Admitting: "Endocrinology

## 2017-05-08 ENCOUNTER — Encounter: Payer: Self-pay | Admitting: "Endocrinology

## 2017-05-08 VITALS — BP 138/84 | HR 60 | Ht 71.0 in | Wt 256.0 lb

## 2017-05-08 DIAGNOSIS — E782 Mixed hyperlipidemia: Secondary | ICD-10-CM

## 2017-05-08 DIAGNOSIS — I10 Essential (primary) hypertension: Secondary | ICD-10-CM

## 2017-05-08 DIAGNOSIS — E1159 Type 2 diabetes mellitus with other circulatory complications: Secondary | ICD-10-CM

## 2017-05-08 MED ORDER — DULAGLUTIDE 1.5 MG/0.5ML ~~LOC~~ SOAJ: 1.5000 mg | SUBCUTANEOUS | 1 refills | Status: DC

## 2017-05-08 MED ORDER — METFORMIN HCL 500 MG PO TABS: 500.0000 mg | ORAL_TABLET | Freq: Two times a day (BID) | ORAL | 2 refills | Status: DC

## 2017-05-08 NOTE — Progress Notes (Signed)
Consult Note       05/08/2017, 5:58 PM   Subjective:    Patient ID: John [name], male    DOB: 16-May-1945.  John [name] is being seen in consultation for management of currently uncontrolled symptomatic diabetes requested by  John Kirschner, MD.   Past Medical History:  Diagnosis Date  . Atrial flutter ([place]) 12/2010   Admitted with symptomatic bradycardia (HR 40s), atrial flutter with slow ventricular response 12/2010 + volume overload; AV nodal agents d/c'd and Pradaxa started; RFA in 01/2011  . CHF (congestive heart failure) ([place])   . Chronic combined systolic and diastolic heart failure (HCC)    echo 01/06/11: mild LVH, EF 65-70%, mod to severe LAE, mild RVE, mild RAE, PASP 32;   TEE 10/12: EF 45-50%   . Diabetes mellitus    non insulin dependant  . Hyperlipidemia   . Hypertension   . Osteoarthritis   . Presence of permanent cardiac pacemaker   . PSVT (paroxysmal supraventricular tachycardia) (HCC)    Possibly atrial flutter   Past Surgical History:  Procedure Laterality Date  . ATRIAL FLUTTER ABLATION N/A 02/07/2011   Procedure: ATRIAL FLUTTER ABLATION;  Surgeon: Evans Lance, MD;  Location: [facility] CATH LAB;  Service: Cardiovascular;  Laterality: N/A;  . CARDIAC ELECTROPHYSIOLOGY STUDY AND ABLATION  02/07/11  . CARDIOVERSION N/A 03/23/2016   Procedure: CARDIOVERSION;  Surgeon: Evans Lance, MD;  Location: [place];  Service: Cardiovascular;  Laterality: N/A;  . COLONOSCOPY N/A 04/17/2017   Procedure: COLONOSCOPY;  Surgeon: Rogene Houston, MD;  Location: [facility];  Service: Endoscopy;  Laterality: N/A;  830  . EP IMPLANTABLE DEVICE N/A 08/29/2015   Procedure: Pacemaker Implant;  Surgeon: Evans Lance, MD;  Location: [place] CV LAB;  Service: Cardiovascular;  Laterality: N/A;  . EP IMPLANTABLE DEVICE N/A 12/07/2015   Procedure: PPM Lead Revision/Repair;  Surgeon: Evans Lance, MD;  Location: [place] CV LAB;  Service: Cardiovascular;  Laterality: N/A;  . KNEE ARTHROSCOPY  2005   left  . LUMBAR SPINE SURGERY     "I've had 6 ORs 1972 thru 2004"  . POLYPECTOMY  04/17/2017   Procedure: POLYPECTOMY;  Surgeon: Rogene Houston, MD;  Location: [facility];  Service: Endoscopy;;  transverse colon x3;   Social History   Socioeconomic History  . Marital status: Married    Spouse name: None  . Number of children: None  . Years of education: None  . Highest education level: None  Social Needs  . Financial resource strain: None  . Food insecurity - worry: None  . Food insecurity - inability: None  . Transportation needs - medical: None  . Transportation needs - non-medical: None  Occupational History  . Occupation: Immunologist: RETIRED  Tobacco Use  . Smoking status: Former Smoker    Packs/day: 1.00    Years: 10.00    Pack years: 10.00    Types: Cigarettes    Last attempt to quit: 03/26/1986    Years since quitting: 31.1  . Smokeless tobacco: Never Used  . Tobacco comment: "stopped  cigarette  smoking 1988"  Substance and Sexual Activity  . Alcohol use: Yes    Alcohol/week: 0.0 oz    Comment: "quit alcohol ~ 2007"  . Drug use: No  . Sexual activity: Yes    Partners: Female  Other Topics Concern  . None  Social History Narrative  . None   Outpatient Encounter Medications as of 05/08/2017  Medication Sig  . amLODipine (NORVASC) 10 MG tablet TAKE 1 TABLET (10 MG TOTAL) BY MOUTH DAILY.  Marland Kitchen apixaban (ELIQUIS) 5 MG TABS tablet TAKE 1 TABLET BY MOUTH 2 (TWO) TIMES DAILY.  . benazepril (LOTENSIN) 40 MG tablet Take 1 tablet (40 mg total) by mouth daily.  . carvedilol (COREG) 25 MG tablet Take 1 tablet (25 mg total) by mouth 2 (two) times daily with a meal.  . Dulaglutide (TRULICITY) 1.5 WN/4.6EV SOPN Inject 1.5 mg into the skin once a week.  . furosemide (LASIX) 40 MG tablet TAKE 1 TABLET TWICE DAILY (Patient taking differently:  Take 40 mg by mouth twice daily)  . GAVILYTE-N WITH FLAVOR PACK 420 g solution Take 4,000 mLs by mouth once.  . Insulin Degludec (TRESIBA FLEXTOUCH) 200 UNIT/ML SOPN Inject 50 Units into the skin at bedtime.  . Magnesium 400 MG CAPS Take 400 mg by mouth daily.   . metFORMIN (GLUCOPHAGE) 500 MG tablet Take 1 tablet (500 mg total) by mouth 2 (two) times daily with a meal.  . Multiple Vitamins-Minerals (MULTIVITAMIN WITH MINERALS) tablet Take 1 tablet by mouth daily.  Marland Kitchen Resveratrol 100 MG CAPS Take 100 mg by mouth daily.   . rosuvastatin (CRESTOR) 20 MG tablet Take 1 tablet (20 mg total) by mouth daily.  . tamsulosin (FLOMAX) 0.4 MG CAPS capsule Take 1 capsule (0.4 mg total) by mouth at bedtime.  . [DISCONTINUED] hydrALAZINE (APRESOLINE) 25 MG tablet Take 1 tablet (25 mg total) by mouth 3 (three) times daily.  . [DISCONTINUED] metFORMIN (GLUCOPHAGE) 1000 MG tablet Take 1 tablet (1,000 mg total) by mouth 2 (two) times daily with a meal.  . [DISCONTINUED] TRULICITY 1.5 OJ/5.0KX SOPN    No facility-administered encounter medications on file as of 05/08/2017.     ALLERGIES: No Known Allergies  VACCINATION STATUS: Immunization History  Administered Date(s) Administered  . Influenza,inj,Quad PF,6+ Mos 11/24/2014, 11/29/2015, 12/11/2016  . Influenza-Unspecified 11/24/2013  . Pneumococcal Conjugate-13 11/19/2013  . Pneumococcal Polysaccharide-23 11/29/2015  . Pneumococcal-Unspecified 03/24/2009  . Td 06/23/2010    Diabetes  He presents for his initial diabetic visit. He has type 2 diabetes mellitus. Onset time: He was diagnosed at approximate age of 35 years. His disease course has been worsening. There are no hypoglycemic associated symptoms. Pertinent negatives for hypoglycemia include no confusion, headaches, pallor or seizures. There are no diabetic associated symptoms. Pertinent negatives for diabetes include no chest pain, no fatigue, no polydipsia, no polyphagia, no polyuria and no  weakness. There are no hypoglycemic complications. Symptoms are worsening. Diabetic complications include heart disease. Risk factors for coronary artery disease include diabetes mellitus, dyslipidemia, family history, obesity, male sex, hypertension, sedentary lifestyle and tobacco exposure. Current diabetic treatment includes insulin injections. His weight is stable. He is following a generally unhealthy diet. When asked about meal planning, he reported none. He has not had a previous visit with a dietitian. He never participates in exercise. (He did not bring any meter or logs to review today.  His most recent A1c was 8.6% on April 11, 2017.) An ACE inhibitor/angiotensin II receptor blocker is being taken. He does  not see a podiatrist.Eye exam is current.  Hyperlipidemia  This is a chronic problem. The current episode started more than 1 year ago. The problem is controlled. Exacerbating diseases include diabetes and obesity. Pertinent negatives include no chest pain, myalgias or shortness of breath. Current antihyperlipidemic treatment includes statins. Risk factors for coronary artery disease include diabetes mellitus, dyslipidemia, family history, obesity, male sex, hypertension and a sedentary lifestyle.  Hypertension  This is a chronic problem. The current episode started more than 1 year ago. The problem is controlled. Pertinent negatives include no chest pain, headaches, neck pain, palpitations or shortness of breath. Risk factors for coronary artery disease include dyslipidemia, family history, male gender, obesity, sedentary lifestyle and smoking/tobacco exposure. Past treatments include ACE inhibitors.      Review of Systems  Constitutional: Negative for chills, fatigue, fever and unexpected weight change.  HENT: Negative for dental problem, mouth sores and trouble swallowing.   Eyes: Negative for visual disturbance.  Respiratory: Negative for cough, choking, chest tightness, shortness of  breath and wheezing.   Cardiovascular: Negative for chest pain, palpitations and leg swelling.  Gastrointestinal: Negative for abdominal distention, abdominal pain, constipation, diarrhea, nausea and vomiting.  Endocrine: Negative for polydipsia, polyphagia and polyuria.  Genitourinary: Negative for dysuria, flank pain, hematuria and urgency.  Musculoskeletal: Negative for back pain, gait problem, myalgias and neck pain.  Skin: Negative for pallor, rash and wound.  Neurological: Negative for seizures, syncope, weakness, numbness and headaches.  Psychiatric/Behavioral: Negative.  Negative for confusion and dysphoric mood.    Objective:    BP 138/84   Pulse 60   Ht 5' 11"  (1.803 m)   Wt 256 lb (116.1 kg)   BMI 35.70 kg/m   Wt Readings from Last 3 Encounters:  05/08/17 256 lb (116.1 kg)  04/17/17 259 lb (117.5 kg)  04/11/17 259 lb (117.5 kg)     Physical Exam  Constitutional: He is oriented to person, place, and time. He appears well-developed. He is cooperative. No distress.  HENT:  Head: Normocephalic and atraumatic.  Eyes: EOM are normal.  Neck: Normal range of motion. Neck supple. No tracheal deviation present. No thyromegaly present.  Cardiovascular: Normal rate, S1 normal, S2 normal and normal heart sounds. Exam reveals no gallop.  No murmur heard. Pulses:      Dorsalis pedis pulses are 1+ on the right side, and 1+ on the left side.       Posterior tibial pulses are 1+ on the right side, and 1+ on the left side.  Pulmonary/Chest: Breath sounds normal. No respiratory distress. He has no wheezes.  Abdominal: Soft. Bowel sounds are normal. He exhibits no distension. There is no tenderness. There is no guarding and no CVA tenderness.  Musculoskeletal: He exhibits no edema.       Right shoulder: He exhibits no swelling and no deformity.  Neurological: He is alert and oriented to person, place, and time. He has normal strength and normal reflexes. No cranial nerve deficit or  sensory deficit. Gait normal.  Skin: Skin is warm and dry. No rash noted. No cyanosis. Nails show no clubbing.  Psychiatric: He has a normal mood and affect. His speech is normal. Cognition and memory are normal.    CMP ( most recent) CMP     Component Value Date/Time   NA 139 03/23/2016 0711   NA 138 12/05/2015 0838   K 4.2 03/23/2016 0711   CL 102 03/07/2016 1043   CO2 24 03/07/2016 1043   GLUCOSE 198 (  H) 03/23/2016 0711   BUN 15 03/07/2016 1043   BUN 22 12/05/2015 0838   CREATININE 1.32 (H) 03/07/2016 1043   CALCIUM 9.2 03/07/2016 1043   PROT 7.4 12/24/2016 0819   ALBUMIN 4.4 12/24/2016 0819   AST 25 12/24/2016 0819   ALT 23 12/24/2016 0819   ALKPHOS 62 12/24/2016 0819   BILITOT 1.1 12/24/2016 0819   GFRNONAA 63 12/05/2015 0838   GFRAA 73 12/05/2015 0838     Diabetic Labs (most recent): Lab Results  Component Value Date   HGBA1C 8.6 04/11/2017   HGBA1C 9.8 (H) 08/25/2015   HGBA1C 8.4 (H) 01/05/2011     Lipid Panel ( most recent) Lipid Panel     Component Value Date/Time   CHOL 160 12/24/2016 0819   TRIG 223 (H) 12/24/2016 0819   HDL 62 12/24/2016 0819   CHOLHDL 2.6 12/24/2016 0819   CHOLHDL 3.2 11/16/2013 0702   VLDL 45 (H) 11/16/2013 0702   LDLCALC 53 12/24/2016 0819      Lab Results  Component Value Date   TSH 1.563 08/25/2015   TSH 1.206 01/05/2011        Assessment & Plan:   1. DM type 2 causing vascular disease ([place])  - John [name] has currently uncontrolled symptomatic type 2 DM since 72 years of age,  with most recent A1c of 8.6 %. Recent labs reviewed.  -his diabetes is complicated by obesity/sedentary life , congestive heart failure/bradyarrhythmia and GURJOT BRISCO remains at a high risk for more acute and chronic complications which include CAD, CVA, CKD, retinopathy, and neuropathy. These are all discussed in detail with the patient.  - I have counseled him on diet management and weight loss, by adopting a carbohydrate  restricted/protein rich diet.  - Suggestion is made for him to avoid simple carbohydrates  from his diet including Cakes, Sweet Desserts, Ice Cream, Soda (diet and regular), Sweet Tea, Candies, Chips, Cookies, Store Bought Juices, Alcohol in Excess of  1-2 drinks a day, Artificial Sweeteners, and "Sugar-free" Products. This will help patient to have stable blood glucose profile and potentially avoid unintended weight gain.  - I encouraged him to switch to  unprocessed or minimally processed complex starch and increased protein intake (animal or plant source), fruits, and vegetables.  - he is advised to stick to a routine mealtimes to eat 3 meals  a day and avoid unnecessary snacks ( to snack only to correct hypoglycemia).   - he will be scheduled with Jearld Fenton, RDN, CDE for individualized diabetes education.  - I have approached him with the following individualized plan to manage diabetes and patient agrees:   -He may need intensive treatment with insulin to achieve control of diabetes target. -  In preparation, I approach him to start monitoring blood glucose 4 times a day-before meals and at bedtime and return in 1 week with his meter and logs.  -In the meantime, I have adjusted his Tyler Aas to 50 units nightly.  - Patient is warned not to take insulin without proper monitoring per orders.  -Patient is encouraged to call clinic for blood glucose levels less than 70 or above 300 mg /dl. - I will lower metformin to 500 mg p.o. twice daily, therapeutically suitable for patient . -Discussed and initiated Trulicity 9.73 mg subcutaneously weekly, to advance to 1.5 mg weekly as tolerated.  - Patient specific target  A1c;  LDL, HDL, Triglycerides, and  Waist Circumference were discussed in detail.  2) BP/HTN: His blood  pressure is controlled to target. Continue current medications including ACEI/ARB. 3) Lipids/HPL:    His recent lipid panel shows controlled LDL at 53,   Patient is advised to  continue statins. 4)  Weight/Diet: CDE Consult will be initiated , exercise, and detailed carbohydrates information provided.  5) Chronic Care/Health Maintenance:  -he  is on ACEI/ARB and Statin medications and  is encouraged to continue to follow up with Ophthalmology, Dentist,  Podiatrist at least yearly or according to recommendations, and advised to   stay away from smoking. I have recommended yearly flu vaccine and pneumonia vaccination at least every 5 years; moderate intensity exercise for up to 150 minutes weekly; and  sleep for at least 7 hours a day.  - I advised patient to maintain close follow up with John Kirschner, MD for primary care needs.  - Time spent with the patient: 1 hour, of which >50% was spent in obtaining information about his symptoms, reviewing his previous labs, evaluations, and treatments, counseling him about his currently uncontrolled complicated type 2 diabetes, hyperlipidemia, hypertension, and developing a plan for long term treatment; his  questions were answered to his satisfaction.  Follow up plan: - Return for follow up with meter and logs- no labs.  Glade Lloyd, MD [facility] [address] [place],  [address] Phone: [phone]  Fax: [phone]    05/08/2017, 5:58 PM  This note was partially dictated with voice recognition software. Similar sounding words can be transcribed inadequately or may not  be corrected upon review.

## 2017-05-08 NOTE — Patient Instructions (Signed)

## 2017-05-15 ENCOUNTER — Ambulatory Visit (INDEPENDENT_AMBULATORY_CARE_PROVIDER_SITE_OTHER): Payer: Medicare HMO | Admitting: "Endocrinology

## 2017-05-15 ENCOUNTER — Encounter: Payer: Self-pay | Admitting: "Endocrinology

## 2017-05-15 VITALS — BP 134/75 | HR 65 | Ht 71.0 in | Wt 252.0 lb

## 2017-05-15 DIAGNOSIS — E1159 Type 2 diabetes mellitus with other circulatory complications: Secondary | ICD-10-CM | POA: Diagnosis not present

## 2017-05-15 DIAGNOSIS — E782 Mixed hyperlipidemia: Secondary | ICD-10-CM | POA: Diagnosis not present

## 2017-05-15 DIAGNOSIS — I10 Essential (primary) hypertension: Secondary | ICD-10-CM | POA: Diagnosis not present

## 2017-05-15 NOTE — Progress Notes (Signed)
Consult Note       05/15/2017, 4:29 PM   Subjective:    Patient ID: John [name], male    DOB: 10-02-1945.  John [name] is being seen in consultation for management of currently uncontrolled symptomatic diabetes requested by  Mikey Kirschner, MD.   Past Medical History:  Diagnosis Date  . Atrial flutter ([place]) 12/2010   Admitted with symptomatic bradycardia (HR 40s), atrial flutter with slow ventricular response 12/2010 + volume overload; AV nodal agents d/c'd and Pradaxa started; RFA in 01/2011  . CHF (congestive heart failure) ([place])   . Chronic combined systolic and diastolic heart failure (HCC)    echo 01/06/11: mild LVH, EF 65-70%, mod to severe LAE, mild RVE, mild RAE, PASP 32;   TEE 10/12: EF 45-50%   . Diabetes mellitus    non insulin dependant  . Hyperlipidemia   . Hypertension   . Osteoarthritis   . Presence of permanent cardiac pacemaker   . PSVT (paroxysmal supraventricular tachycardia) (HCC)    Possibly atrial flutter   Past Surgical History:  Procedure Laterality Date  . ATRIAL FLUTTER ABLATION N/A 02/07/2011   Procedure: ATRIAL FLUTTER ABLATION;  Surgeon: Evans Lance, MD;  Location: [facility] CATH LAB;  Service: Cardiovascular;  Laterality: N/A;  . CARDIAC ELECTROPHYSIOLOGY STUDY AND ABLATION  02/07/11  . CARDIOVERSION N/A 03/23/2016   Procedure: CARDIOVERSION;  Surgeon: Evans Lance, MD;  Location: [place];  Service: Cardiovascular;  Laterality: N/A;  . COLONOSCOPY N/A 04/17/2017   Procedure: COLONOSCOPY;  Surgeon: Rogene Houston, MD;  Location: [facility];  Service: Endoscopy;  Laterality: N/A;  830  . EP IMPLANTABLE DEVICE N/A 08/29/2015   Procedure: Pacemaker Implant;  Surgeon: Evans Lance, MD;  Location: [place] CV LAB;  Service: Cardiovascular;  Laterality: N/A;  . EP IMPLANTABLE DEVICE N/A 12/07/2015   Procedure: PPM Lead Revision/Repair;  Surgeon: Evans Lance, MD;  Location: [place] CV LAB;  Service: Cardiovascular;  Laterality: N/A;  . KNEE ARTHROSCOPY  2005   left  . LUMBAR SPINE SURGERY     "I've had 6 ORs 1972 thru 2004"  . POLYPECTOMY  04/17/2017   Procedure: POLYPECTOMY;  Surgeon: Rogene Houston, MD;  Location: [facility];  Service: Endoscopy;;  transverse colon x3;   Social History   Socioeconomic History  . Marital status: Married    Spouse name: None  . Number of children: None  . Years of education: None  . Highest education level: None  Social Needs  . Financial resource strain: None  . Food insecurity - worry: None  . Food insecurity - inability: None  . Transportation needs - medical: None  . Transportation needs - non-medical: None  Occupational History  . Occupation: Immunologist: RETIRED  Tobacco Use  . Smoking status: Former Smoker    Packs/day: 1.00    Years: 10.00    Pack years: 10.00    Types: Cigarettes    Last attempt to quit: 03/26/1986    Years since quitting: 31.1  . Smokeless tobacco: Never Used  . Tobacco comment: "stopped  cigarette  smoking 1988"  Substance and Sexual Activity  . Alcohol use: Yes    Alcohol/week: 0.0 oz    Comment: "quit alcohol ~ 2007"  . Drug use: No  . Sexual activity: Yes    Partners: Female  Other Topics Concern  . None  Social History Narrative  . None   Outpatient Encounter Medications as of 05/15/2017  Medication Sig  . amLODipine (NORVASC) 10 MG tablet TAKE 1 TABLET (10 MG TOTAL) BY MOUTH DAILY.  Marland Kitchen apixaban (ELIQUIS) 5 MG TABS tablet TAKE 1 TABLET BY MOUTH 2 (TWO) TIMES DAILY.  . benazepril (LOTENSIN) 40 MG tablet Take 1 tablet (40 mg total) by mouth daily.  . carvedilol (COREG) 25 MG tablet Take 1 tablet (25 mg total) by mouth 2 (two) times daily with a meal.  . Dulaglutide (TRULICITY) 1.5 IY/6.4BR SOPN Inject 1.5 mg into the skin once a week.  . furosemide (LASIX) 40 MG tablet TAKE 1 TABLET TWICE DAILY (Patient taking differently:  Take 40 mg by mouth twice daily)  . GAVILYTE-N WITH FLAVOR PACK 420 g solution Take 4,000 mLs by mouth once.  . Insulin Degludec (TRESIBA FLEXTOUCH) 200 UNIT/ML SOPN Inject 60 Units into the skin at bedtime.  . Magnesium 400 MG CAPS Take 400 mg by mouth daily.   . metFORMIN (GLUCOPHAGE) 500 MG tablet Take 1 tablet (500 mg total) by mouth 2 (two) times daily with a meal.  . Multiple Vitamins-Minerals (MULTIVITAMIN WITH MINERALS) tablet Take 1 tablet by mouth daily.  Marland Kitchen Resveratrol 100 MG CAPS Take 100 mg by mouth daily.   . rosuvastatin (CRESTOR) 20 MG tablet Take 1 tablet (20 mg total) by mouth daily.  . tamsulosin (FLOMAX) 0.4 MG CAPS capsule Take 1 capsule (0.4 mg total) by mouth at bedtime.  . [DISCONTINUED] hydrALAZINE (APRESOLINE) 25 MG tablet Take 1 tablet (25 mg total) by mouth 3 (three) times daily.   No facility-administered encounter medications on file as of 05/15/2017.     ALLERGIES: No Known Allergies  VACCINATION STATUS: Immunization History  Administered Date(s) Administered  . Influenza,inj,Quad PF,6+ Mos 11/24/2014, 11/29/2015, 12/11/2016  . Influenza-Unspecified 11/24/2013  . Pneumococcal Conjugate-13 11/19/2013  . Pneumococcal Polysaccharide-23 11/29/2015  . Pneumococcal-Unspecified 03/24/2009  . Td 06/23/2010    Diabetes  He presents for his follow-up diabetic visit. He has type 2 diabetes mellitus. Onset time: He was diagnosed at approximate age of 69 years. His disease course has been improving. There are no hypoglycemic associated symptoms. Pertinent negatives for hypoglycemia include no confusion, headaches, pallor or seizures. There are no diabetic associated symptoms. Pertinent negatives for diabetes include no chest pain, no fatigue, no polydipsia, no polyphagia, no polyuria and no weakness. There are no hypoglycemic complications. Symptoms are improving. Diabetic complications include heart disease. Risk factors for coronary artery disease include diabetes  mellitus, dyslipidemia, family history, obesity, male sex, hypertension, sedentary lifestyle and tobacco exposure. Current diabetic treatment includes insulin injections. His weight is decreasing steadily. He is following a generally unhealthy diet. When asked about meal planning, he reported none. He has not had a previous visit with a dietitian. He never participates in exercise. His breakfast blood glucose range is generally 140-180 mg/dl. His lunch blood glucose range is generally 140-180 mg/dl. His dinner blood glucose range is generally 140-180 mg/dl. His bedtime blood glucose range is generally 140-180 mg/dl. His overall blood glucose range is 140-180 mg/dl. An ACE inhibitor/angiotensin II receptor blocker is being taken. He does not see a podiatrist.Eye exam is current.  Hyperlipidemia  This is a chronic problem. The current episode started more than 1 year ago. The problem is controlled. Exacerbating diseases include diabetes and obesity. Pertinent negatives include no chest pain, myalgias or shortness of breath. Current antihyperlipidemic treatment includes statins. Risk factors for coronary artery disease include diabetes mellitus, dyslipidemia, family history, obesity, male sex, hypertension and a sedentary lifestyle.  Hypertension  This is a chronic problem. The current episode started more than 1 year ago. The problem is controlled. Pertinent negatives include no chest pain, headaches, neck pain, palpitations or shortness of breath. Risk factors for coronary artery disease include dyslipidemia, family history, male gender, obesity, sedentary lifestyle and smoking/tobacco exposure. Past treatments include ACE inhibitors.      Review of Systems  Constitutional: Negative for chills, fatigue, fever and unexpected weight change.  HENT: Negative for dental problem, mouth sores and trouble swallowing.   Eyes: Negative for visual disturbance.  Respiratory: Negative for cough, choking, chest  tightness, shortness of breath and wheezing.   Cardiovascular: Negative for chest pain, palpitations and leg swelling.  Gastrointestinal: Negative for abdominal distention, abdominal pain, constipation, diarrhea, nausea and vomiting.  Endocrine: Negative for polydipsia, polyphagia and polyuria.  Genitourinary: Negative for dysuria, flank pain, hematuria and urgency.  Musculoskeletal: Negative for back pain, gait problem, myalgias and neck pain.  Skin: Negative for pallor, rash and wound.  Neurological: Negative for seizures, syncope, weakness, numbness and headaches.  Psychiatric/Behavioral: Negative.  Negative for confusion and dysphoric mood.    Objective:    BP 134/75   Pulse 65   Ht 5' 11"  (1.803 m)   Wt 252 lb (114.3 kg)   BMI 35.15 kg/m   Wt Readings from Last 3 Encounters:  05/15/17 252 lb (114.3 kg)  05/08/17 256 lb (116.1 kg)  04/17/17 259 lb (117.5 kg)     Physical Exam  Constitutional: He is oriented to person, place, and time. He appears well-developed. He is cooperative. No distress.  HENT:  Head: Normocephalic and atraumatic.  Eyes: EOM are normal.  Neck: Normal range of motion. Neck supple. No tracheal deviation present. No thyromegaly present.  Cardiovascular: Normal rate, S1 normal, S2 normal and normal heart sounds. Exam reveals no gallop.  No murmur heard. Pulses:      Dorsalis pedis pulses are 1+ on the right side, and 1+ on the left side.       Posterior tibial pulses are 1+ on the right side, and 1+ on the left side.  Pulmonary/Chest: Breath sounds normal. No respiratory distress. He has no wheezes.  Abdominal: Soft. Bowel sounds are normal. He exhibits no distension. There is no tenderness. There is no guarding and no CVA tenderness.  Musculoskeletal: He exhibits no edema.       Right shoulder: He exhibits no swelling and no deformity.  Neurological: He is alert and oriented to person, place, and time. He has normal strength and normal reflexes. No  cranial nerve deficit or sensory deficit. Gait normal.  Skin: Skin is warm and dry. No rash noted. No cyanosis. Nails show no clubbing.  Psychiatric: He has a normal mood and affect. His speech is normal. Cognition and memory are normal.    CMP ( most recent) CMP     Component Value Date/Time   NA 139 03/23/2016 0711   NA 138 12/05/2015 0838   K 4.2 03/23/2016 0711   CL 102 03/07/2016 1043   CO2 24 03/07/2016 1043   GLUCOSE 198 (H) 03/23/2016 0711   BUN 15 03/07/2016  1043   BUN 22 12/05/2015 0838   CREATININE 1.32 (H) 03/07/2016 1043   CALCIUM 9.2 03/07/2016 1043   PROT 7.4 12/24/2016 0819   ALBUMIN 4.4 12/24/2016 0819   AST 25 12/24/2016 0819   ALT 23 12/24/2016 0819   ALKPHOS 62 12/24/2016 0819   BILITOT 1.1 12/24/2016 0819   GFRNONAA 63 12/05/2015 0838   GFRAA 73 12/05/2015 0838     Diabetic Labs (most recent): Lab Results  Component Value Date   HGBA1C 8.6 04/11/2017   HGBA1C 9.8 (H) 08/25/2015   HGBA1C 8.4 (H) 01/05/2011     Lipid Panel ( most recent) Lipid Panel     Component Value Date/Time   CHOL 160 12/24/2016 0819   TRIG 223 (H) 12/24/2016 0819   HDL 62 12/24/2016 0819   CHOLHDL 2.6 12/24/2016 0819   CHOLHDL 3.2 11/16/2013 0702   VLDL 45 (H) 11/16/2013 0702   LDLCALC 53 12/24/2016 0819      Lab Results  Component Value Date   TSH 1.563 08/25/2015   TSH 1.206 01/05/2011        Assessment & Plan:   1. DM type 2 causing vascular disease ([place])  - John [name] has currently uncontrolled symptomatic type 2 DM since 72 years of age. -He came with significant improvement in his blood glucose profile averaging between 140 and 180, his most recent A1c was 8.6%.  Recent labs reviewed.  -his diabetes is complicated by obesity/sedentary life , congestive heart failure/bradyarrhythmia and John [name] remains at a high risk for more acute and chronic complications which include CAD, CVA, CKD, retinopathy, and neuropathy. These are all discussed  in detail with the patient.  - I have counseled him on diet management and weight loss, by adopting a carbohydrate restricted/protein rich diet.  -  Suggestion is made for him to avoid simple carbohydrates  from his diet including Cakes, Sweet Desserts / Pastries, Ice Cream, Soda (diet and regular), Sweet Tea, Candies, Chips, Cookies, Store Bought Juices, Alcohol in Excess of  1-2 drinks a day, Artificial Sweeteners, and "Sugar-free" Products. This will help patient to have stable blood glucose profile and potentially avoid unintended weight gain.  - I encouraged him to switch to  unprocessed or minimally processed complex starch and increased protein intake (animal or plant source), fruits, and vegetables.  - he is advised to stick to a routine mealtimes to eat 3 meals  a day and avoid unnecessary snacks ( to snack only to correct hypoglycemia).   - he will be scheduled with John [name], RDN, CDE for individualized diabetes education.  - I have approached him with the following individualized plan to manage diabetes and patient agrees:   -Based on his current glycemic profile, he will not need prandial insulin for now.    -  I have  adjusted his Tyler Aas to 60 units nightly, associated with strict monitoring of blood glucose 2 times a day-daily before breakfast and at bedtime. - Patient is warned not to take insulin without proper monitoring per orders.  -Patient is encouraged to call clinic for blood glucose levels less than 70 or above 300 mg /dl. - I will continue metformin 500 mg p.o. twice daily, therapeutically suitable for patient . -I have increased his Trulicity to 1.5 mg weekly, side effects and precautions discussed with him.   - Patient specific target  A1c;  LDL, HDL, Triglycerides, and  Waist Circumference were discussed in detail.  2) BP/HTN: His blood pressure is controlled  to target. Continue current medications including ACEI/ARB. 3) Lipids/HPL:    His recent lipid panel  shows controlled LDL at 53,   Patient is advised to continue statins. 4)  Weight/Diet: CDE Consult will be initiated , exercise, and detailed carbohydrates information provided.  5) Chronic Care/Health Maintenance:  -he  is on ACEI/ARB and Statin medications and  is encouraged to continue to follow up with Ophthalmology, Dentist,  Podiatrist at least yearly or according to recommendations, and advised to   stay away from smoking. I have recommended yearly flu vaccine and pneumonia vaccination at least every 5 years; moderate intensity exercise for up to 150 minutes weekly; and  sleep for at least 7 hours a day.  - I advised patient to maintain close follow up with Mikey Kirschner, MD for primary care needs.  - Time spent with the patient: 25 min, of which >50% was spent in reviewing his blood glucose logs , discussing his hypo- and hyper-glycemic episodes, reviewing his current and  previous labs and insulin doses and developing a plan to avoid hypo- and hyper-glycemia. Please refer to Patient Instructions for Blood Glucose Monitoring and Insulin/Medications Dosing Guide"  in media tab for additional information.   Follow up plan: - Return in about 9 weeks (around 07/17/2017) for follow up with pre-visit labs, meter, and logs.  Glade Lloyd, MD [facility] [address] [place], [place] [address] Phone: [phone]  Fax: [phone]    05/15/2017, 4:29 PM  This note was partially dictated with voice recognition software. Similar sounding words can be transcribed inadequately or may not  be corrected upon review.

## 2017-05-15 NOTE — Patient Instructions (Signed)

## 2017-05-16 ENCOUNTER — Emergency Department (HOSPITAL_COMMUNITY): Payer: Medicare HMO

## 2017-05-16 ENCOUNTER — Encounter (HOSPITAL_COMMUNITY): Payer: Self-pay | Admitting: Emergency Medicine

## 2017-05-16 ENCOUNTER — Emergency Department (HOSPITAL_COMMUNITY)
Admission: EM | Admit: 2017-05-16 | Discharge: 2017-05-16 | Disposition: A | Discharge status: Home / Self Care | Payer: Medicare HMO | Attending: Emergency Medicine | Admitting: Emergency Medicine

## 2017-05-16 DIAGNOSIS — E119 Type 2 diabetes mellitus without complications: Secondary | ICD-10-CM | POA: Diagnosis not present

## 2017-05-16 DIAGNOSIS — E1151 Type 2 diabetes mellitus with diabetic peripheral angiopathy without gangrene: Secondary | ICD-10-CM | POA: Insufficient documentation

## 2017-05-16 DIAGNOSIS — Z7984 Long term (current) use of oral hypoglycemic drugs: Secondary | ICD-10-CM | POA: Diagnosis not present

## 2017-05-16 DIAGNOSIS — I110 Hypertensive heart disease with heart failure: Secondary | ICD-10-CM | POA: Diagnosis not present

## 2017-05-16 DIAGNOSIS — Z87891 Personal history of nicotine dependence: Secondary | ICD-10-CM | POA: Diagnosis not present

## 2017-05-16 DIAGNOSIS — Z950 Presence of cardiac pacemaker: Secondary | ICD-10-CM | POA: Insufficient documentation

## 2017-05-16 DIAGNOSIS — I5032 Chronic diastolic (congestive) heart failure: Secondary | ICD-10-CM | POA: Insufficient documentation

## 2017-05-16 DIAGNOSIS — J111 Influenza due to unidentified influenza virus with other respiratory manifestations: Secondary | ICD-10-CM | POA: Insufficient documentation

## 2017-05-16 DIAGNOSIS — Z7901 Long term (current) use of anticoagulants: Secondary | ICD-10-CM | POA: Diagnosis not present

## 2017-05-16 DIAGNOSIS — Z79899 Other long term (current) drug therapy: Secondary | ICD-10-CM | POA: Diagnosis not present

## 2017-05-16 DIAGNOSIS — R69 Illness, unspecified: Secondary | ICD-10-CM

## 2017-05-16 DIAGNOSIS — R509 Fever, unspecified: Secondary | ICD-10-CM | POA: Diagnosis not present

## 2017-05-16 DIAGNOSIS — R05 Cough: Secondary | ICD-10-CM | POA: Diagnosis not present

## 2017-05-16 MED ORDER — HYDROCODONE-HOMATROPINE 5-1.5 MG/5ML PO SYRP: 5.0000 mL | ORAL_SOLUTION | Freq: Four times a day (QID) | ORAL | 0 refills | Status: DC | PRN

## 2017-05-16 MED ORDER — ACETAMINOPHEN 500 MG PO TABS
1000.0000 mg | ORAL_TABLET | Freq: Once | ORAL | Status: AC
  Administered 2017-05-16: 1000 mg via ORAL
  Filled 2017-05-16: qty 2

## 2017-05-16 MED ORDER — OSELTAMIVIR PHOSPHATE 75 MG PO CAPS: 75.0000 mg | ORAL_CAPSULE | Freq: Two times a day (BID) | ORAL | 0 refills | Status: DC

## 2017-05-16 NOTE — ED Triage Notes (Signed)
Patient complaining of cough and congestion x 2-3 days.

## 2017-05-16 NOTE — Discharge Instructions (Signed)
Your history and your examination are consistent with flu or flulike illness.  Please wash your hands frequently.  Please have everyone in the home to wash hands frequently.  Please increase your fluids.  Use Tylenol extra strength every 4 hours as needed for aching, or for temperature elevation.  Use Tamiflu 2 times daily with food.  May use Hycodan for cough and congestion if needed.  This medication may cause drowsiness, please use it with caution. Please see Dr Wolfgang Phoenix for follow up and recheck next week. return to the Emergency Dept in any emergent changes or problem.

## 2017-05-16 NOTE — ED Provider Notes (Signed)
[facility] EMERGENCY DEPARTMENT Provider Note   CSN: [id number] Arrival date & time: 05/16/17  1419     History   Chief Complaint Chief Complaint  Patient presents with  . Cough    HPI GEROGE [name] is a 72 y.o. male.  Patient is a 72 year old male who presents to the emergency department with a complaint of cough and congestion.  The patient states that he has had 2-3 days of cough and congestion.  He states the phlegm is mostly clear or white.  He has noticed that it seems as though there is more of the flame than there was previously.  The patient states he has had some fever and chills, as well as some body aching.  No hemoptysis reported.  No vomiting, no diarrhea reported.  No chest pain, or loss of consciousness, no unusual sweats reported.  He presents to the emergency department for evaluation at this time.      Past Medical History:  Diagnosis Date  . Atrial flutter ([place]) 12/2010   Admitted with symptomatic bradycardia (HR 40s), atrial flutter with slow ventricular response 12/2010 + volume overload; AV nodal agents d/c'd and Pradaxa started; RFA in 01/2011  . CHF (congestive heart failure) ([place])   . Chronic combined systolic and diastolic heart failure (HCC)    echo 01/06/11: mild LVH, EF 65-70%, mod to severe LAE, mild RVE, mild RAE, PASP 32;   TEE 10/12: EF 45-50%   . Diabetes mellitus    non insulin dependant  . Hyperlipidemia   . Hypertension   . Osteoarthritis   . Presence of permanent cardiac pacemaker   . PSVT (paroxysmal supraventricular tachycardia) (HCC)    Possibly atrial flutter    Patient Active Problem List   Diagnosis Date Noted  . History of colonic polyps 12/28/2016  . Atrial pacemaker lead displacement 12/07/2015  . Acute CHF (congestive heart failure) ([place]) 11/21/2015  . CHF, acute on chronic ([place]) 11/20/2015  . Complete heart block ([place]) 11/20/2015  . Chronic diastolic CHF (congestive heart failure) ([place]) 08/28/2015  .  Hypertensive heart disease 08/28/2015  . NSVT (nonsustained ventricular tachycardia) ([place]) 08/28/2015  . Elevated troponin 08/28/2015  . Second degree heart block 08/25/2015  . Bradycardia 08/25/2015  . Acute diastolic CHF (congestive heart failure) ([place]) 08/25/2015  . Chronic anticoagulation-discontinued 05/04/2011  . Atrial flutter ([place]) 01/05/2011  . DM type 2 causing vascular disease ([place]) 12/23/2009  . Mixed hyperlipidemia 12/23/2009  . HYPERTENSION 12/23/2009  . DEGENERATIVE JOINT DISEASE 12/23/2009    Past Surgical History:  Procedure Laterality Date  . ATRIAL FLUTTER ABLATION N/A 02/07/2011   Procedure: ATRIAL FLUTTER ABLATION;  Surgeon: Evans Lance, MD;  Location: [facility] CATH LAB;  Service: Cardiovascular;  Laterality: N/A;  . CARDIAC ELECTROPHYSIOLOGY STUDY AND ABLATION  02/07/11  . CARDIOVERSION N/A 03/23/2016   Procedure: CARDIOVERSION;  Surgeon: Evans Lance, MD;  Location: [place];  Service: Cardiovascular;  Laterality: N/A;  . COLONOSCOPY N/A 04/17/2017   Procedure: COLONOSCOPY;  Surgeon: Rogene Houston, MD;  Location[facility];  Service: Endoscopy;  Laterality: N/A;  830  . EP IMPLANTABLE DEVICE N/A 08/29/2015   Procedure: Pacemaker Implant;  Surgeon: Evans Lance, MD;  Location: [place] CV LAB;  Service: Cardiovascular;  Laterality: N/A;  . EP IMPLANTABLE DEVICE N/A 12/07/2015   Procedure: PPM Lead Revision/Repair;  Surgeon: Evans Lance, MD;  Location: [place] CV LAB;  Service: Cardiovascular;  Laterality: N/A;  . KNEE ARTHROSCOPY  2005  left  . LUMBAR SPINE SURGERY     "I've had 6 ORs 1972 thru 2004"  . POLYPECTOMY  04/17/2017   Procedure: POLYPECTOMY;  Surgeon: Rogene Houston, MD;  Location: [facility];  Service: Endoscopy;;  transverse colon x3;       Home Medications    Prior to Admission medications   Medication Sig Start Date End Date Taking? Authorizing Provider  amLODipine (NORVASC) 10 MG tablet TAKE 1 TABLET (10 MG TOTAL)  BY MOUTH DAILY. 04/08/17  Yes Mikey Kirschner, MD  apixaban (ELIQUIS) 5 MG TABS tablet TAKE 1 TABLET BY MOUTH 2 (TWO) TIMES DAILY. 04/17/17  Yes Rehman, Mechele Dawley, MD  benazepril (LOTENSIN) 40 MG tablet Take 1 tablet (40 mg total) by mouth daily. 12/11/16  Yes Mikey Kirschner, MD  carvedilol (COREG) 25 MG tablet Take 1 tablet (25 mg total) by mouth 2 (two) times daily with a meal. 04/18/17  Yes Rehman, Mechele Dawley, MD  Dulaglutide (TRULICITY) 1.5 WL/7.9GX SOPN Inject 1.5 mg into the skin once a week. 05/08/17  Yes Nida, Marella Chimes, MD  furosemide (LASIX) 40 MG tablet TAKE 1 TABLET TWICE DAILY Patient taking differently: Take 40 mg by mouth twice daily 02/21/17  Yes Herminio Commons, MD  Insulin Degludec (TRESIBA FLEXTOUCH) 200 UNIT/ML SOPN Inject 60 Units into the skin at bedtime.   Yes [provider]  Magnesium 400 MG CAPS Take 400 mg by mouth daily.    Yes [provider]  metFORMIN (GLUCOPHAGE) 500 MG tablet Take 1 tablet (500 mg total) by mouth 2 (two) times daily with a meal. 05/08/17  Yes Nida, Marella Chimes, MD  Multiple Vitamins-Minerals (MULTIVITAMIN WITH MINERALS) tablet Take 1 tablet by mouth daily.   Yes [provider]  Resveratrol 100 MG CAPS Take 100 mg by mouth daily.    Yes [provider]  rosuvastatin (CRESTOR) 20 MG tablet Take 1 tablet (20 mg total) by mouth daily. 12/11/16  Yes Mikey Kirschner, MD  tamsulosin (FLOMAX) 0.4 MG CAPS capsule Take 1 capsule (0.4 mg total) by mouth at bedtime. 12/11/16  Yes Mikey Kirschner, MD  hydrALAZINE (APRESOLINE) 25 MG tablet Take 1 tablet (25 mg total) by mouth 3 (three) times daily. 03/22/11 06/19/11  Yehuda Savannah, MD    Family History Family History  Problem Relation Age of Onset  . Heart attack Mother   . Hypertension Mother   . Heart attack Father   . Hypertension Father   . Heart attack Brother   . Colon cancer Neg Hx     Social History Social History   Tobacco Use  . Smoking  status: Former Smoker    Packs/day: 1.00    Years: 10.00    Pack years: 10.00    Types: Cigarettes    Last attempt to quit: 03/26/1986    Years since quitting: 31.1  . Smokeless tobacco: Never Used  . Tobacco comment: "stopped cigarette  smoking 1988"  Substance Use Topics  . Alcohol use: Yes    Alcohol/week: 0.0 oz    Comment: "quit alcohol ~ 2007"  . Drug use: No     Allergies   Patient has no known allergies.   Review of Systems Review of Systems  Constitutional: Positive for appetite change, chills and fever. Negative for activity change.       All ROS Neg except as noted in HPI  HENT: Positive for congestion. Negative for nosebleeds.   Eyes: Negative for photophobia and discharge.  Respiratory: Positive for cough. Negative for shortness of breath and wheezing.   Cardiovascular: Negative for chest pain and palpitations.  Gastrointestinal: Negative for abdominal pain and blood in stool.  Genitourinary: Negative for dysuria, frequency and hematuria.  Musculoskeletal: Positive for myalgias. Negative for arthralgias, back pain and neck pain.  Skin: Negative.   Neurological: Negative for dizziness, seizures and speech difficulty.  Psychiatric/Behavioral: Negative for confusion and hallucinations.     Physical Exam Updated Vital Signs BP 115/64 (BP Location: Right Arm)   Pulse 79   Temp (!) 100.5 F (38.1 C) (Oral)   Resp 20   Ht 5' 11"  (1.803 m)   Wt 111.1 kg (245 lb)   SpO2 95%   BMI 34.17 kg/m   Physical Exam  Constitutional: He is oriented to person, place, and time. He appears well-developed and well-nourished.  Non-toxic appearance.  HENT:  Head: Normocephalic.  Right Ear: Tympanic membrane and external ear normal.  Left Ear: Tympanic membrane and external ear normal.  Nasal congestion present.  Eyes: EOM and lids are normal. Pupils are equal, round, and reactive to light.  Neck: Normal range of motion. Neck supple. Carotid bruit is not present.    Cardiovascular: Normal rate, regular rhythm, normal heart sounds, intact distal pulses and normal pulses.  Pulmonary/Chest: Effort normal and breath sounds normal. No stridor. No respiratory distress.  There is symmetrical rise and fall of the chest.  Patient speaks in complete sentences without problem.  Abdominal: Soft. Bowel sounds are normal. There is no tenderness. There is no guarding.  Musculoskeletal: Normal range of motion. He exhibits no edema or tenderness.  Negative Homans sign.  Lymphadenopathy:       Head (right side): No submandibular adenopathy present.       Head (left side): No submandibular adenopathy present.    He has no cervical adenopathy.  Neurological: He is alert and oriented to person, place, and time. He has normal strength. No cranial nerve deficit or sensory deficit.  Skin: Skin is warm and dry.  Psychiatric: He has a normal mood and affect. His speech is normal.  Nursing note and vitals reviewed.    ED Treatments / Results  Labs (all labs ordered are listed, but only abnormal results are displayed) Labs Reviewed - No data to display  EKG  EKG Interpretation None       Radiology Dg Chest 2 View  Result Date: 05/16/2017 CLINICAL DATA:  Nonproductive cough. EXAM: CHEST  2 VIEW COMPARISON:  Radiographs of December 08, 2015. FINDINGS: Stable cardiomediastinal silhouette. Left-sided pacemaker is unchanged in position. No pneumothorax or pleural effusion is noted. No acute pulmonary disease is noted. Bony thorax is unremarkable. IMPRESSION: No active cardiopulmonary disease. Electronically Signed   By: Marijo Conception, M.D.   On: 05/16/2017 15:33    Procedures Procedures (including critical care time)  Medications Ordered in ED Medications  acetaminophen (TYLENOL) tablet 1,000 mg (1,000 mg Oral Given 05/16/17 1532)     Initial Impression / Assessment and Plan / ED Course  I have reviewed the triage vital signs and the nursing notes.  Pertinent  labs & imaging results that were available during my care of the patient were reviewed by me and considered in my medical decision making (see chart for details).       Final Clinical Impressions(s) / ED Diagnoses MDM Patient examined by Dr. Tanna Furry with me.  Temperature was 100.5 on admission.  The remainder of the vital signs are within normal limits.  Pulse oximetry is 95% on room air.  Chest x-ray is negative for pneumonia or acute changes or problems.  I suspect the patient has influenza, or flulike illness.  Patient will be treated with Tamiflu, Tylenol, increase fluids, we also discussed good handwashing, and handwashing for the entire family.  Patient will follow up with Dr. Wolfgang Phoenix at the beginning of next week for follow-up and recheck.  Patient will return to the emergency department sooner if any changes, problems, or concerns.   Final diagnoses:  Influenza-like illness    ED Discharge Orders        Ordered    oseltamivir (TAMIFLU) 75 MG capsule  Every 12 hours     05/16/17 1637    HYDROcodone-homatropine (HYCODAN) 5-1.5 MG/5ML syrup  Every 6 hours PRN     05/16/17 1637       Lily Kocher, PA-C 05/16/17 1639    Tanna Furry, MD 05/16/17 2340

## 2017-06-10 ENCOUNTER — Encounter: Payer: Self-pay | Admitting: Family Medicine

## 2017-06-10 ENCOUNTER — Other Ambulatory Visit: Payer: Self-pay | Admitting: *Deleted

## 2017-06-10 ENCOUNTER — Ambulatory Visit (INDEPENDENT_AMBULATORY_CARE_PROVIDER_SITE_OTHER): Payer: Medicare HMO | Admitting: Family Medicine

## 2017-06-10 VITALS — BP 128/72 | Ht 71.0 in | Wt 252.0 lb

## 2017-06-10 DIAGNOSIS — N400 Enlarged prostate without lower urinary tract symptoms: Secondary | ICD-10-CM | POA: Diagnosis not present

## 2017-06-10 DIAGNOSIS — E785 Hyperlipidemia, unspecified: Secondary | ICD-10-CM

## 2017-06-10 DIAGNOSIS — I10 Essential (primary) hypertension: Secondary | ICD-10-CM | POA: Diagnosis not present

## 2017-06-10 DIAGNOSIS — Z13220 Encounter for screening for lipoid disorders: Secondary | ICD-10-CM | POA: Diagnosis not present

## 2017-06-10 MED ORDER — BENAZEPRIL HCL 40 MG PO TABS: 40.0000 mg | ORAL_TABLET | Freq: Every day | ORAL | 1 refills | Status: DC

## 2017-06-10 MED ORDER — CARVEDILOL 25 MG PO TABS: 25.0000 mg | ORAL_TABLET | Freq: Two times a day (BID) | ORAL | 1 refills | Status: DC

## 2017-06-10 MED ORDER — APIXABAN 5 MG PO TABS: ORAL_TABLET | ORAL | 3 refills | Status: DC

## 2017-06-10 MED ORDER — AMLODIPINE BESYLATE 10 MG PO TABS: ORAL_TABLET | ORAL | 1 refills | Status: DC

## 2017-06-10 MED ORDER — TAMSULOSIN HCL 0.4 MG PO CAPS: 0.4000 mg | ORAL_CAPSULE | Freq: Every day | ORAL | 1 refills | Status: DC

## 2017-06-10 MED ORDER — ROSUVASTATIN CALCIUM 20 MG PO TABS: 20.0000 mg | ORAL_TABLET | Freq: Every day | ORAL | 1 refills | Status: DC

## 2017-06-10 NOTE — Progress Notes (Signed)
   Subjective:    Patient ID: John [name], male    DOB: 24-Dec-1945, 72 y.o.   MRN: [id number]  HPI Patient is here to follow up on chronic issues. Last A1c was done 04/11/2017 8.6.He sees Dr.Nida for DM.Htn pt is on Amlodipine 10 mg one daily and lotensin 40 mg daily and carvedilol 25 mg BID , Lasix 40 mg BID.He states he eats healthy and gets some exercise. He states he see a cardiologist Dr.Taylor.  workong wth dr Lacey Jensen   Blood pressure medicine and blood pressure levels reviewed today with patient. Compliant with blood pressure medicine. States does not miss a dose. No obvious side effects. Blood pressure generally good when checked elsewhere. Watching salt intake.  Exercise not the best right now  Staying active in the house   Patient continues to take lipid medication regularly. No obvious side effects from it. Generally does not miss a dose. Prior blood work results are reviewed with patient. Patient continues to work on fat intake in diet   Patient experiencing ongoing issues with prostate hypertrophy.  Notes when he sticks with the Flomax he has excellent control.  Good compliance with medication.  No obvious side effects.  Helps his urination tremendous   Review of Systems No headache, no major weight loss or weight gain, no chest pain no back pain abdominal pain no change in bowel habits complete ROS otherwise negative     Objective:   Physical Exam  Alert and oriented, vitals reviewed and stable, NAD ENT-TM's and ext canals WNL bilat via otoscopic exam Soft palate, tonsils and post pharynx WNL via oropharyngeal exam Neck-symmetric, no masses; thyroid nonpalpable and nontender Pulmonary-no tachypnea or accessory muscle use; Clear without wheezes via auscultation Card--no abnrml murmurs, rhythm reg and rate WNL Carotid pulses symmetric, without bruits      Assessment & Plan:  Impression 1 hypertension good control discussed maintain same meds  2.   Hyperlipidemia.  Status uncertain.  Add lipid profile current meds to maintain same compliance discussed  3.  Prostate hypertrophy overall improved to maintain same long-term rationale discussed  4.  Type 2 diabetes insulin dependent.  Covered by Dr. Laverta Baltimore,   Medications refilled.  Diet prescribed.  Blood work further recommendations based on results does not often check feet of diabetic patient so I did today.  Exam within normal limits

## 2017-06-13 ENCOUNTER — Encounter: Payer: Self-pay | Admitting: Nutrition

## 2017-06-13 ENCOUNTER — Encounter: Payer: Medicare HMO | Attending: Family Medicine | Admitting: Nutrition

## 2017-06-13 VITALS — Ht 71.0 in | Wt 250.0 lb

## 2017-06-13 DIAGNOSIS — Z13220 Encounter for screening for lipoid disorders: Secondary | ICD-10-CM | POA: Diagnosis not present

## 2017-06-13 DIAGNOSIS — E1159 Type 2 diabetes mellitus with other circulatory complications: Secondary | ICD-10-CM | POA: Insufficient documentation

## 2017-06-13 DIAGNOSIS — Z713 Dietary counseling and surveillance: Secondary | ICD-10-CM | POA: Diagnosis not present

## 2017-06-13 DIAGNOSIS — E118 Type 2 diabetes mellitus with unspecified complications: Secondary | ICD-10-CM

## 2017-06-13 DIAGNOSIS — Z794 Long term (current) use of insulin: Secondary | ICD-10-CM

## 2017-06-13 DIAGNOSIS — E669 Obesity, unspecified: Secondary | ICD-10-CM

## 2017-06-13 NOTE — Progress Notes (Signed)
  Medical Nutrition Therapy:  Appt start time: 1330 end time:  1430.   Assessment:  Primary concerns today: Diabete Type 2. Lives with his wife. Eats three meals per day. A1C was 8.6% which is better than previously 9%. BS 118 mg this am and 160 mg/dl  At night. Gardens as a hobby. Testing blood sugars twice a day.  Physical actvity; walking and working in garden.   BS are much better recently. Engaged to make further changes with his portions and balanced meals for improve blood sugar control Tresiba 60 units, Metformin 161 mg BID and Trulicity.   Lab Results  Component Value Date   HGBA1C 8.6 04/11/2017   Preferred Learning Style:   No preference indicated   Learning Readiness:  Ready  Change in progress   MEDICATIONS:    DIETARY INTAKE:  24-hr recall:  B ( AM): Raisin bran and bran, or or eggs and toast, sausage, toast and PB and coffee, Snk ( AM):   L ( PM): Fish sandwich at [employer], water Snk ( PM):  D ( PM): Corn, spinach, roast pork, water Snk ( PM): none Beverages: water.  Usual physical activity: walks and gardens.  Estimated energy needs: 1800  calories 200  g carbohydrates 135 g protein 50 g fat  Progress Towards Goal(s):  In progress.   Nutritional Diagnosis:  NB-1.1 Food and nutrition-related knowledge deficit As related to Diabetse.  As evidenced by A1C.    Intervention:  Nutrition and Diabetes education provided on My Plate, CHO counting, meal planning, portion sizes, timing of meals, avoiding snacks between meals unless having a low blood sugar, target ranges for A1C and blood sugars, signs/symptoms and treatment of hyper/hypoglycemia, monitoring blood sugars, taking medications as prescribed, benefits of exercising 30 minutes per day and prevention of complications of DM. Marland Kitchen Goals 1 FollOW My Plate 2 Eat 3-4 carb choices per meal Take meds as prescribed Call if low blood sugars Increase walking  Teaching Method  Utilized:3 Visual Auditory Hands on  Handouts given during visit include:  The Plate MEthod   Meal Plan Card  Diabetes Instructions.   Barriers to learning/adherence to lifestyle change: none  Demonstrated degree of understanding via:  Teach Back   Monitoring/Evaluation:  Dietary intake, exercise, meal planning, and body weight in 1 month(s).

## 2017-06-13 NOTE — Patient Instructions (Signed)
Goals 1 FollOW My Plate 2 Eat 3-4 carb choices per meal Take meds as prescribed Call if low blood sugars Increase walking

## 2017-06-14 LAB — LIPID PANEL
CHOL/HDL RATIO: 3.2 ratio (ref 0.0–5.0)
Cholesterol, Total: 156 mg/dL (ref 100–199)
HDL: 49 mg/dL (ref >39)
LDL CALC: 64 mg/dL (ref 0–99)
Triglycerides: 214 mg/dL — ABNORMAL HIGH (ref 0–149)
VLDL CHOLESTEROL CAL: 43 mg/dL — AB (ref 5–40)

## 2017-07-05 DIAGNOSIS — H40013 Open angle with borderline findings, low risk, bilateral: Secondary | ICD-10-CM | POA: Diagnosis not present

## 2017-07-05 DIAGNOSIS — H518 Other specified disorders of binocular movement: Secondary | ICD-10-CM | POA: Diagnosis not present

## 2017-07-05 DIAGNOSIS — H532 Diplopia: Secondary | ICD-10-CM | POA: Diagnosis not present

## 2017-07-05 DIAGNOSIS — E113293 Type 2 diabetes mellitus with mild nonproliferative diabetic retinopathy without macular edema, bilateral: Secondary | ICD-10-CM | POA: Diagnosis not present

## 2017-07-05 DIAGNOSIS — H5000 Unspecified esotropia: Secondary | ICD-10-CM | POA: Diagnosis not present

## 2017-07-10 ENCOUNTER — Other Ambulatory Visit: Payer: Self-pay | Admitting: "Endocrinology

## 2017-07-10 DIAGNOSIS — E1159 Type 2 diabetes mellitus with other circulatory complications: Secondary | ICD-10-CM | POA: Diagnosis not present

## 2017-07-11 ENCOUNTER — Telehealth: Payer: Self-pay | Admitting: *Deleted

## 2017-07-11 ENCOUNTER — Other Ambulatory Visit: Payer: Self-pay | Admitting: "Endocrinology

## 2017-07-11 DIAGNOSIS — E11319 Type 2 diabetes mellitus with unspecified diabetic retinopathy without macular edema: Secondary | ICD-10-CM | POA: Diagnosis not present

## 2017-07-11 LAB — COMPREHENSIVE METABOLIC PANEL
A/G RATIO: 1.4 (ref 1.2–2.2)
ALBUMIN: 4.4 g/dL (ref 3.5–4.8)
ALT: 21 IU/L (ref 0–44)
AST: 24 IU/L (ref 0–40)
Alkaline Phosphatase: 71 IU/L (ref 39–117)
BUN / CREAT RATIO: 14 (ref 10–24)
BUN: 15 mg/dL (ref 8–27)
Bilirubin Total: 0.8 mg/dL (ref 0.0–1.2)
CALCIUM: 9.6 mg/dL (ref 8.6–10.2)
CO2: 24 mmol/L (ref 20–29)
Chloride: 99 mmol/L (ref 96–106)
Creatinine, Ser: 1.06 mg/dL (ref 0.76–1.27)
GFR calc non Af Amer: 70 mL/min/{1.73_m2} (ref >59)
GFR, EST AFRICAN AMERICAN: 81 mL/min/{1.73_m2} (ref >59)
Globulin, Total: 3.1 g/dL (ref 1.5–4.5)
Glucose: 183 mg/dL — ABNORMAL HIGH (ref 65–99)
POTASSIUM: 4.3 mmol/L (ref 3.5–5.2)
Sodium: 139 mmol/L (ref 134–144)
TOTAL PROTEIN: 7.5 g/dL (ref 6.0–8.5)

## 2017-07-11 LAB — TSH: TSH: 1.94 u[IU]/mL (ref 0.450–4.500)

## 2017-07-11 LAB — HGB A1C W/O EAG: Hgb A1c MFr Bld: 8.6 % — ABNORMAL HIGH (ref 4.8–5.6)

## 2017-07-11 LAB — T4, FREE: Free T4: 1.12 ng/dL (ref 0.82–1.77)

## 2017-07-11 LAB — MICROALBUMIN, URINE: Microalbumin, Urine: <3 ug/mL

## 2017-07-11 LAB — SPECIMEN STATUS REPORT

## 2017-07-11 LAB — VITAMIN D 25 HYDROXY (VIT D DEFICIENCY, FRACTURES): Vit D, 25-Hydroxy: 24.3 ng/mL — ABNORMAL LOW (ref 30.0–100.0)

## 2017-07-11 NOTE — Telephone Encounter (Signed)
Discussed with pt. Pt transferred to the front to schedule office visit

## 2017-07-11 NOTE — Telephone Encounter (Signed)
Dr Shanon Brow called and spoke with Dr Richardson Landry concerning patient's double vision. Dr Richardson Landry recommends an office visit next week with him to discuss further. Left message to return call to patient to schedule office visit next week with Dr Richardson Landry

## 2017-07-16 ENCOUNTER — Ambulatory Visit (INDEPENDENT_AMBULATORY_CARE_PROVIDER_SITE_OTHER): Payer: Medicare HMO | Admitting: Family Medicine

## 2017-07-16 ENCOUNTER — Encounter: Payer: Self-pay | Admitting: Family Medicine

## 2017-07-16 VITALS — BP 118/82 | Ht 71.0 in | Wt 252.0 lb

## 2017-07-16 DIAGNOSIS — H4922 Sixth [abducent] nerve palsy, left eye: Secondary | ICD-10-CM | POA: Diagnosis not present

## 2017-07-16 NOTE — Progress Notes (Signed)
   Subjective:    Patient ID: John [name], male    DOB: [birth year], 72 y.o.   MRN: [id number]  HPI Patient arrives to discuss recent eye dr appointment for double vision.  Double vision has gone  Sugars now doing better for the last two months 140 to 125   No difficulties with unexplained headache  Pt first noted douvle vision  When driving   No numbness.  Pt gets reg eye dr visits  Pos hx of cataract surgery    Pt actually saw both his ophthalmologist, he removed his cataracts, the ophthalmologist advised him this was likely due to diabetes and to dissipate.  Patient also saw his optometrist.  He called me.  Wanted me to also see the patient  Review of Systems No headache, no major weight loss or weight gain, no chest pain no back pain abdominal pain no change in bowel habits complete ROS otherwise negative     Objective:   Physical Exam  Alert and oriented, vitals reviewed and stable, NAD ENT-TM's and ext canals WNL bilat via otoscopic exam Soft palate, tonsils and post pharynx WNL via oropharyngeal exam Neck-symmetric, no masses; thyroid nonpalpable and nontender Pulmonary-no tachypnea or accessory muscle use; Clear without wheezes via auscultation Card--no abnrml murmurs, rhythm reg and rate WNL Carotid pulses symmetric, without bruits Extraocular motion exam does reveal difficulty with lateral movement of the left eye.  Consistent with 6th nerve palsy.  Ocular facial exam otherwise normal.  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  Literature search while presently patient revealed the [employer] ophthalmologic processes recommends no scanning when history consistent with isolated neuropathy diabetic patient.  Of note diabetes was not controlled several months ago.  Patient "seems to be improving.  Will reevaluate in 2 months.  If resolved then no need for further work-up.  If persists will need to press on  do further testing discussed questions answered.  Greater than 50% of this 25 minute face to face visit was spent in counseling and discussion and coordination of care regarding the above diagnosis/diagnosies

## 2017-07-17 ENCOUNTER — Encounter: Payer: Self-pay | Admitting: "Endocrinology

## 2017-07-17 ENCOUNTER — Ambulatory Visit (INDEPENDENT_AMBULATORY_CARE_PROVIDER_SITE_OTHER): Payer: Medicare HMO | Admitting: "Endocrinology

## 2017-07-17 ENCOUNTER — Encounter: Payer: Self-pay | Admitting: Nutrition

## 2017-07-17 ENCOUNTER — Encounter: Payer: Medicare HMO | Attending: Family Medicine | Admitting: Nutrition

## 2017-07-17 ENCOUNTER — Ambulatory Visit (INDEPENDENT_AMBULATORY_CARE_PROVIDER_SITE_OTHER): Payer: Medicare HMO | Admitting: *Deleted

## 2017-07-17 VITALS — BP 131/75 | HR 61 | Ht 71.0 in | Wt 248.0 lb

## 2017-07-17 VITALS — Ht 71.0 in | Wt 248.0 lb

## 2017-07-17 DIAGNOSIS — E1159 Type 2 diabetes mellitus with other circulatory complications: Secondary | ICD-10-CM | POA: Diagnosis not present

## 2017-07-17 DIAGNOSIS — E118 Type 2 diabetes mellitus with unspecified complications: Principal | ICD-10-CM

## 2017-07-17 DIAGNOSIS — I442 Atrioventricular block, complete: Secondary | ICD-10-CM

## 2017-07-17 DIAGNOSIS — Z6834 Body mass index (BMI) 34.0-34.9, adult: Secondary | ICD-10-CM

## 2017-07-17 DIAGNOSIS — E1165 Type 2 diabetes mellitus with hyperglycemia: Secondary | ICD-10-CM

## 2017-07-17 DIAGNOSIS — E782 Mixed hyperlipidemia: Secondary | ICD-10-CM | POA: Diagnosis not present

## 2017-07-17 DIAGNOSIS — I10 Essential (primary) hypertension: Secondary | ICD-10-CM

## 2017-07-17 DIAGNOSIS — Z6837 Body mass index (BMI) 37.0-37.9, adult: Secondary | ICD-10-CM | POA: Diagnosis not present

## 2017-07-17 DIAGNOSIS — IMO0002 Reserved for concepts with insufficient information to code with codable children: Secondary | ICD-10-CM

## 2017-07-17 HISTORY — DX: Body mass index (BMI) 37.0-37.9, adult: Z68.37

## 2017-07-17 HISTORY — DX: Morbid (severe) obesity due to excess calories: E66.01

## 2017-07-17 MED ORDER — METFORMIN HCL 1000 MG PO TABS: 1000.0000 mg | ORAL_TABLET | Freq: Two times a day (BID) | ORAL | 2 refills | Status: DC

## 2017-07-17 NOTE — Patient Instructions (Signed)

## 2017-07-17 NOTE — Progress Notes (Signed)
  Medical Nutrition Therapy:  Appt start time: 0820 end time:  1430.   Assessment:  Primary concerns today: Diabete Type 2. Lives with his wife. Eats three meals per day.  Has has cut down on portion sizes.Tresibat 60 units an Metformin 751 mg BID and Trulicity. Lost 4 lbs. His son has been sick and having surgery soon to amputate foot/leg. He has been having to take care of his kids and sometimes eating later.  He started walking some now and being outdoors more. Had surgery on eyes and vision is still a problem but suppose to get better per eye doctor. Unable to see well enough to drive at this time.  Lab Results  Component Value Date   HGBA1C 8.6 (H) 07/10/2017  BS in am 025852'D. Evenings 147-220's.  Preferred Learning Style:   No preference indicated   Learning Readiness:  Ready  Change in progress   MEDICATIONS:    DIETARY INTAKE:  24-hr recall:  B ( AM): Coffee, 1 toast, 1 egg and bacon, fruit, Snk ( AM):   L ( PM): Lima beans, dressing and ham and [place], water  Snk ( PM):  D ( PM): same as lunch Snk ( PM): none Beverages: water.  Usual physical activity: walks and gardens.  Estimated energy needs: 1800  calories 200  g carbohydrates 135 g protein 50 g fat  Progress Towards Goal(s):  In progress.   Nutritional Diagnosis:  NB-1.1 Food and nutrition-related knowledge deficit As related to Diabetse.  As evidenced by A1C.    Intervention:  Nutrition and Diabetes education provided on My Plate, CHO counting, meal planning, portion sizes, timing of meals, avoiding snacks between meals unless having a low blood sugar, target ranges for A1C and blood sugars, signs/symptoms and treatment of hyper/hypoglycemia, monitoring blood sugars, taking medications as prescribed, benefits of exercising 30 minutes per day and prevention of complications of DM. Marland Kitchen Goals 1 2 eggs with breakfast, 2 slices toast and fruit 2. Increase water intake 3. Walk 30 minutes after a meal  daily preferably supper. Get A1C to 7%   Teaching Method Utilized:3 Visual Auditory Hands on  Handouts given during visit include:  The Plate MEthod   Meal Plan Card  Diabetes Instructions.   Barriers to learning/adherence to lifestyle change: none  Demonstrated degree of understanding via:  Teach Back   Monitoring/Evaluation:  Dietary intake, exercise, meal planning, and body weight in 3 month(s).

## 2017-07-17 NOTE — Patient Instructions (Signed)
Goals 1 2 eggs with breakfast, 2 slices toast and fruit 2. Increase water intake 3. Walk 30 minutes after a meal daily preferably supper. Get A1C to 7%

## 2017-07-17 NOTE — Progress Notes (Signed)
Consult Note       07/17/2017, 10:58 AM   Subjective:    Patient ID: John [name], male    DOB: 1945-09-03.  John [name] is being seen in consultation for management of currently uncontrolled symptomatic diabetes requested by  Mikey Kirschner, MD.   Past Medical History:  Diagnosis Date  . Atrial flutter ([place]) 12/2010   Admitted with symptomatic bradycardia (HR 40s), atrial flutter with slow ventricular response 12/2010 + volume overload; AV nodal agents d/c'd and Pradaxa started; RFA in 01/2011  . CHF (congestive heart failure) ([place])   . Chronic combined systolic and diastolic heart failure (HCC)    echo 01/06/11: mild LVH, EF 65-70%, mod to severe LAE, mild RVE, mild RAE, PASP 32;   TEE 10/12: EF 45-50%   . Diabetes mellitus    non insulin dependant  . Hyperlipidemia   . Hypertension   . Osteoarthritis   . Presence of permanent cardiac pacemaker   . PSVT (paroxysmal supraventricular tachycardia) (HCC)    Possibly atrial flutter   Past Surgical History:  Procedure Laterality Date  . ATRIAL FLUTTER ABLATION N/A 02/07/2011   Procedure: ATRIAL FLUTTER ABLATION;  Surgeon: Evans Lance, MD;  Location: [facility] CATH LAB;  Service: Cardiovascular;  Laterality: N/A;  . CARDIAC ELECTROPHYSIOLOGY STUDY AND ABLATION  02/07/11  . CARDIOVERSION N/A 03/23/2016   Procedure: CARDIOVERSION;  Surgeon: Evans Lance, MD;  Location: [place];  Service: Cardiovascular;  Laterality: N/A;  . COLONOSCOPY N/A 04/17/2017   Procedure: COLONOSCOPY;  Surgeon: Rogene Houston, MD;  Location: [facility];  Service: Endoscopy;  Laterality: N/A;  830  . EP IMPLANTABLE DEVICE N/A 08/29/2015   Procedure: Pacemaker Implant;  Surgeon: Evans Lance, MD;  Location: [place] CV LAB;  Service: Cardiovascular;  Laterality: N/A;  . EP IMPLANTABLE DEVICE N/A 12/07/2015   Procedure: PPM Lead Revision/Repair;  Surgeon: Evans Lance, MD;  Location: [place] CV LAB;  Service: Cardiovascular;  Laterality: N/A;  . KNEE ARTHROSCOPY  2005   left  . LUMBAR SPINE SURGERY     "I've had 6 ORs 1972 thru 2004"  . POLYPECTOMY  04/17/2017   Procedure: POLYPECTOMY;  Surgeon: Rogene Houston, MD;  Location: [facility];  Service: Endoscopy;;  transverse colon x3;   Social History   Socioeconomic History  . Marital status: Married    Spouse name: Not on file  . Number of children: Not on file  . Years of education: Not on file  . Highest education level: Not on file  Occupational History  . Occupation: Immunologist: RETIRED  Social Needs  . Financial resource strain: Not on file  . Food insecurity:    Worry: Not on file    Inability: Not on file  . Transportation needs:    Medical: Not on file    Non-medical: Not on file  Tobacco Use  . Smoking status: Former Smoker    Packs/day: 1.00    Years: 10.00    Pack years: 10.00    Types: Cigarettes    Last attempt to quit: 03/26/1986  Years since quitting: 31.3  . Smokeless tobacco: Never Used  . Tobacco comment: "stopped cigarette  smoking 1988"  Substance and Sexual Activity  . Alcohol use: Yes    Alcohol/week: 0.0 oz    Comment: "quit alcohol ~ 2007"  . Drug use: No  . Sexual activity: Yes    Partners: Female  Lifestyle  . Physical activity:    Days per week: Not on file    Minutes per session: Not on file  . Stress: Not on file  Relationships  . Social connections:    Talks on phone: Not on file    Gets together: Not on file    Attends religious service: Not on file    Active member of club or organization: Not on file    Attends meetings of clubs or organizations: Not on file    Relationship status: Not on file  Other Topics Concern  . Not on file  Social History Narrative  . Not on file   Outpatient Encounter Medications as of 07/17/2017  Medication Sig  . amLODipine (NORVASC) 10 MG tablet One daily  . apixaban  (ELIQUIS) 5 MG TABS tablet TAKE 1 TABLET BY MOUTH 2 (TWO) TIMES DAILY.  . benazepril (LOTENSIN) 40 MG tablet Take 1 tablet (40 mg total) by mouth daily.  . carvedilol (COREG) 25 MG tablet Take 1 tablet (25 mg total) by mouth 2 (two) times daily with a meal.  . Dulaglutide (TRULICITY) 1.5 WU/9.8JX SOPN Inject 1.5 mg into the skin once a week.  . furosemide (LASIX) 40 MG tablet TAKE 1 TABLET TWICE DAILY (Patient taking differently: Take 40 mg by mouth twice daily)  . Insulin Degludec (TRESIBA FLEXTOUCH) 200 UNIT/ML SOPN Inject 70 Units into the skin at bedtime.  . Magnesium 400 MG CAPS Take 400 mg by mouth daily.   . metFORMIN (GLUCOPHAGE) 1000 MG tablet Take 1 tablet (1,000 mg total) by mouth 2 (two) times daily with a meal.  . Multiple Vitamins-Minerals (MULTIVITAMIN WITH MINERALS) tablet Take 1 tablet by mouth daily.  Marland Kitchen Resveratrol 100 MG CAPS Take 100 mg by mouth daily.   . rosuvastatin (CRESTOR) 20 MG tablet Take 1 tablet (20 mg total) by mouth daily.  . tamsulosin (FLOMAX) 0.4 MG CAPS capsule Take 1 capsule (0.4 mg total) by mouth at bedtime.  . [DISCONTINUED] hydrALAZINE (APRESOLINE) 25 MG tablet Take 1 tablet (25 mg total) by mouth 3 (three) times daily.  . [DISCONTINUED] metFORMIN (GLUCOPHAGE) 500 MG tablet TAKE 1 TABLET TWICE DAILY WITH MEALS   No facility-administered encounter medications on file as of 07/17/2017.     ALLERGIES: No Known Allergies  VACCINATION STATUS: Immunization History  Administered Date(s) Administered  . Influenza,inj,Quad PF,6+ Mos 11/24/2014, 11/29/2015, 12/11/2016  . Influenza-Unspecified 11/24/2013  . Pneumococcal Conjugate-13 11/19/2013  . Pneumococcal Polysaccharide-23 11/29/2015  . Pneumococcal-Unspecified 03/24/2009  . Td 06/23/2010    Diabetes  He presents for his follow-up diabetic visit. He has type 2 diabetes mellitus. Onset time: He was diagnosed at approximate age of 24 years. His disease course has been stable. There are no hypoglycemic  associated symptoms. Pertinent negatives for hypoglycemia include no confusion, headaches, pallor or seizures. There are no diabetic associated symptoms. Pertinent negatives for diabetes include no chest pain, no fatigue, no polydipsia, no polyphagia, no polyuria and no weakness. There are no hypoglycemic complications. Symptoms are stable. Diabetic complications include heart disease. Risk factors for coronary artery disease include diabetes mellitus, dyslipidemia, family history, obesity, male sex, hypertension, sedentary lifestyle and  tobacco exposure. Current diabetic treatment includes insulin injections. His weight is stable. He is following a generally unhealthy diet. When asked about meal planning, he reported none. He has not had a previous visit with a dietitian. He never participates in exercise. There is no change in his home blood glucose trend. His breakfast blood glucose range is generally 140-180 mg/dl. His bedtime blood glucose range is generally 140-180 mg/dl. His overall blood glucose range is 140-180 mg/dl. An ACE inhibitor/angiotensin II receptor blocker is being taken. He does not see a podiatrist.Eye exam is current.  Hyperlipidemia  This is a chronic problem. The current episode started more than 1 year ago. The problem is controlled. Exacerbating diseases include diabetes and obesity. Pertinent negatives include no chest pain, myalgias or shortness of breath. Current antihyperlipidemic treatment includes statins. Risk factors for coronary artery disease include diabetes mellitus, dyslipidemia, family history, obesity, male sex, hypertension and a sedentary lifestyle.  Hypertension  This is a chronic problem. The current episode started more than 1 year ago. The problem is controlled. Pertinent negatives include no chest pain, headaches, neck pain, palpitations or shortness of breath. Risk factors for coronary artery disease include dyslipidemia, family history, male gender, obesity,  sedentary lifestyle and smoking/tobacco exposure. Past treatments include ACE inhibitors.      Review of Systems  Constitutional: Negative for chills, fatigue, fever and unexpected weight change.  HENT: Negative for dental problem, mouth sores and trouble swallowing.   Eyes: Negative for visual disturbance.  Respiratory: Negative for cough, choking, chest tightness, shortness of breath and wheezing.   Cardiovascular: Negative for chest pain, palpitations and leg swelling.  Gastrointestinal: Negative for abdominal distention, abdominal pain, constipation, diarrhea, nausea and vomiting.  Endocrine: Negative for polydipsia, polyphagia and polyuria.  Genitourinary: Negative for dysuria, flank pain, hematuria and urgency.  Musculoskeletal: Negative for back pain, gait problem, myalgias and neck pain.  Skin: Negative for pallor, rash and wound.  Neurological: Negative for seizures, syncope, weakness, numbness and headaches.  Psychiatric/Behavioral: Negative.  Negative for confusion and dysphoric mood.    Objective:    BP 131/75   Pulse 61   Ht 5' 11"  (1.803 m)   Wt 248 lb (112.5 kg)   BMI 34.59 kg/m   Wt Readings from Last 3 Encounters:  07/17/17 248 lb (112.5 kg)  07/17/17 248 lb (112.5 kg)  07/16/17 252 lb (114.3 kg)     Physical Exam  Constitutional: He is oriented to person, place, and time. He appears well-developed. He is cooperative. No distress.  HENT:  Head: Normocephalic and atraumatic.  Eyes: EOM are normal.  Neck: Normal range of motion. Neck supple. No tracheal deviation present. No thyromegaly present.  Cardiovascular: Normal rate, S1 normal and S2 normal. Exam reveals no gallop.  No murmur heard. Pulses:      Dorsalis pedis pulses are 1+ on the right side, and 1+ on the left side.       Posterior tibial pulses are 1+ on the right side, and 1+ on the left side.  Pulmonary/Chest: Effort normal. No respiratory distress. He has no wheezes.  Abdominal: Bowel sounds  are normal. He exhibits no distension. There is no tenderness. There is no guarding and no CVA tenderness.  Musculoskeletal: He exhibits no edema.       Right shoulder: He exhibits no swelling and no deformity.  Neurological: He is alert and oriented to person, place, and time. He has normal strength and normal reflexes. No cranial nerve deficit or sensory deficit. Gait  normal.  Skin: Skin is warm and dry. No rash noted. No cyanosis. Nails show no clubbing.  Psychiatric: He has a normal mood and affect. His speech is normal. Cognition and memory are normal.    CMP ( most recent) CMP     Component Value Date/Time   NA 139 07/10/2017 0804   K 4.3 07/10/2017 0804   CL 99 07/10/2017 0804   CO2 24 07/10/2017 0804   GLUCOSE 183 (H) 07/10/2017 0804   GLUCOSE 198 (H) 03/23/2016 0711   BUN 15 07/10/2017 0804   CREATININE 1.06 07/10/2017 0804   CREATININE 1.32 (H) 03/07/2016 1043   CALCIUM 9.6 07/10/2017 0804   PROT 7.5 07/10/2017 0804   ALBUMIN 4.4 07/10/2017 0804   AST 24 07/10/2017 0804   ALT 21 07/10/2017 0804   ALKPHOS 71 07/10/2017 0804   BILITOT 0.8 07/10/2017 0804   GFRNONAA 70 07/10/2017 0804   GFRAA 81 07/10/2017 0804     Diabetic Labs (most recent): Lab Results  Component Value Date   HGBA1C 8.6 (H) 07/10/2017   HGBA1C 8.6 04/11/2017   HGBA1C 9.8 (H) 08/25/2015     Lipid Panel ( most recent) Lipid Panel     Component Value Date/Time   CHOL 156 06/13/2017 0824   TRIG 214 (H) 06/13/2017 0824   HDL 49 06/13/2017 0824   CHOLHDL 3.2 06/13/2017 0824   CHOLHDL 3.2 11/16/2013 0702   VLDL 45 (H) 11/16/2013 0702   LDLCALC 64 06/13/2017 0824      Lab Results  Component Value Date   TSH 1.940 07/10/2017   TSH 1.563 08/25/2015   TSH 1.206 01/05/2011   FREET4 1.12 07/10/2017        Assessment & Plan:   1. DM type 2 causing vascular disease ([place])  - John [name] has currently uncontrolled symptomatic type 2 DM since 72 years of age. -He came with slightly  above target fasting blood glucose profile.  His previsit labs show A1c of 8.6%, unchanged from his last visit.   Recent labs reviewed, showing improving renal function.  -his diabetes is complicated by obesity/sedentary life , congestive heart failure/bradyarrhythmia and MARLIN JARRARD remains at a high risk for more acute and chronic complications which include CAD, CVA, CKD, retinopathy, and neuropathy. These are all discussed in detail with the patient.  - I have counseled him on diet management and weight loss, by adopting a carbohydrate restricted/protein rich diet.  -  Suggestion is made for him to avoid simple carbohydrates  from his diet including Cakes, Sweet Desserts / Pastries, Ice Cream, Soda (diet and regular), Sweet Tea, Candies, Chips, Cookies, Store Bought Juices, Alcohol in Excess of  1-2 drinks a day, Artificial Sweeteners, and "Sugar-free" Products. This will help patient to have stable blood glucose profile and potentially avoid unintended weight gain.  - I encouraged him to switch to  unprocessed or minimally processed complex starch and increased protein intake (animal or plant source), fruits, and vegetables.  - he is advised to stick to a routine mealtimes to eat 3 meals  a day and avoid unnecessary snacks ( to snack only to correct hypoglycemia).   - he will be scheduled with Jearld Fenton, RDN, CDE for individualized diabetes education.  - I have approached him with the following individualized plan to manage diabetes and patient agrees:   -Based on his current glycemic profile, he will need slightly higher dose of basal insulin, still does not need prandial insulin for now.   -  I advised him to increase his [place] to 70 units nightly,  associated with strict monitoring of blood glucose 2 times a day-daily before breakfast and at bedtime. - Patient is warned not to take insulin without proper monitoring per orders.  -Patient is encouraged to call clinic for blood  glucose levels less than 70 or above 300 mg /dl. - I will continue metformin 500 mg p.o. twice daily, therapeutically suitable for patient .  Metformin will be maximized next visit if he maintains normal renal function. -I advised him to continue Trulicity 1.5 mg subcutaneously weekly , side effects and precautions discussed with him.   - Patient specific target  A1c;  LDL, HDL, Triglycerides, and  Waist Circumference were discussed in detail.  2) BP/HTN: His blood pressure is controlled to target.  He is advised to continue his current medications including benazepril 40 mg p.o. daily.   3) Lipids/HPL:    His recent lipid panel shows controlled LDL at 53,   Patient is advised to continue statins. 4)  Weight/Diet: CDE Consult has been initiated , exercise, and detailed carbohydrates information provided.  5) Chronic Care/Health Maintenance:  -he  is on ACEI/ARB and Statin medications and  is encouraged to continue to follow up with Ophthalmology, Dentist,  Podiatrist at least yearly or according to recommendations, and advised to   stay away from smoking. I have recommended yearly flu vaccine and pneumonia vaccination at least every 5 years; moderate intensity exercise for up to 150 minutes weekly; and  sleep for at least 7 hours a day.  - I advised patient to maintain close follow up with Mikey Kirschner, MD for primary care needs.  - Time spent with the patient: 25 min, of which >50% was spent in reviewing his blood glucose logs , discussing his hypo- and hyper-glycemic episodes, reviewing his current and  previous labs and insulin doses and developing a plan to avoid hypo- and hyper-glycemia. Please refer to Patient Instructions for Blood Glucose Monitoring and Insulin/Medications Dosing Guide"  in media tab for additional information. John [name] participated in the discussions, expressed understanding, and voiced agreement with the above plans.  All questions were answered to his  satisfaction. he is encouraged to contact clinic should he have any questions or concerns prior to his return visit.  Follow up plan: - Return in about 4 months (around 11/16/2017) for meter, and logs, follow up with pre-visit labs, meter, and logs.  Glade Lloyd, MD [facility] [address] [place], [place] [address] Phone: [phone]  Fax: [phone]    07/17/2017, 10:58 AM  This note was partially dictated with voice recognition software. Similar sounding words can be transcribed inadequately or may not  be corrected upon review.

## 2017-07-17 NOTE — Progress Notes (Signed)
Remote pacemaker transmission.   

## 2017-07-18 ENCOUNTER — Other Ambulatory Visit: Payer: Self-pay

## 2017-07-18 MED ORDER — GLUCOSE BLOOD VI STRP: 1.0000 | ORAL_STRIP | 5 refills | Status: DC | PRN

## 2017-07-19 ENCOUNTER — Encounter: Payer: Self-pay | Admitting: Cardiology

## 2017-07-19 LAB — CUP PACEART REMOTE DEVICE CHECK
Battery Remaining Longevity: 122 mo
Brady Statistic AP VS Percent: 0 %
Brady Statistic AS VS Percent: 1 %
Date Time Interrogation Session: 20190424115251
Implantable Lead Implant Date: 20170605
Implantable Lead Implant Date: 20170605
Implantable Lead Location: 753859
Implantable Pulse Generator Implant Date: 20170605
Lead Channel Impedance Value: 454 Ohm
Lead Channel Pacing Threshold Amplitude: 0.75 V
Lead Channel Setting Pacing Amplitude: 2 V
Lead Channel Setting Pacing Amplitude: 2.5 V
Lead Channel Setting Sensing Sensitivity: 4 mV
MDC IDC LEAD LOCATION: 753860
MDC IDC MSMT BATTERY IMPEDANCE: 135 Ohm
MDC IDC MSMT BATTERY VOLTAGE: 2.79 V
MDC IDC MSMT LEADCHNL RA IMPEDANCE VALUE: 539 Ohm
MDC IDC MSMT LEADCHNL RA PACING THRESHOLD AMPLITUDE: 0.875 V
MDC IDC MSMT LEADCHNL RA PACING THRESHOLD PULSEWIDTH: 0.4 ms
MDC IDC MSMT LEADCHNL RV PACING THRESHOLD PULSEWIDTH: 0.4 ms
MDC IDC SET LEADCHNL RV PACING PULSEWIDTH: 0.4 ms
MDC IDC STAT BRADY AP VP PERCENT: 17 %
MDC IDC STAT BRADY AS VP PERCENT: 82 %

## 2017-07-23 ENCOUNTER — Other Ambulatory Visit: Payer: Self-pay

## 2017-07-23 MED ORDER — GLUCOSE BLOOD VI STRP: 1.0000 | ORAL_STRIP | 3 refills | Status: DC | PRN

## 2017-08-13 ENCOUNTER — Other Ambulatory Visit: Payer: Self-pay | Admitting: Cardiovascular Disease

## 2017-08-15 ENCOUNTER — Other Ambulatory Visit: Payer: Self-pay | Admitting: "Endocrinology

## 2017-08-21 DIAGNOSIS — H532 Diplopia: Secondary | ICD-10-CM | POA: Diagnosis not present

## 2017-08-21 DIAGNOSIS — H4922 Sixth [abducent] nerve palsy, left eye: Secondary | ICD-10-CM | POA: Diagnosis not present

## 2017-08-21 DIAGNOSIS — H518 Other specified disorders of binocular movement: Secondary | ICD-10-CM | POA: Diagnosis not present

## 2017-08-21 DIAGNOSIS — H5000 Unspecified esotropia: Secondary | ICD-10-CM | POA: Diagnosis not present

## 2017-08-28 ENCOUNTER — Other Ambulatory Visit: Payer: Self-pay

## 2017-08-28 MED ORDER — INSULIN DEGLUDEC 200 UNIT/ML ~~LOC~~ SOPN: 70.0000 [IU] | PEN_INJECTOR | Freq: Every day | SUBCUTANEOUS | 0 refills | Status: DC

## 2017-10-02 DIAGNOSIS — H532 Diplopia: Secondary | ICD-10-CM | POA: Diagnosis not present

## 2017-10-02 DIAGNOSIS — H5000 Unspecified esotropia: Secondary | ICD-10-CM | POA: Diagnosis not present

## 2017-10-02 DIAGNOSIS — H4922 Sixth [abducent] nerve palsy, left eye: Secondary | ICD-10-CM | POA: Diagnosis not present

## 2017-10-09 ENCOUNTER — Other Ambulatory Visit: Payer: Self-pay | Admitting: "Endocrinology

## 2017-10-09 DIAGNOSIS — E1159 Type 2 diabetes mellitus with other circulatory complications: Secondary | ICD-10-CM | POA: Diagnosis not present

## 2017-10-10 LAB — COMPREHENSIVE METABOLIC PANEL
ALBUMIN: 4.2 g/dL (ref 3.5–4.8)
ALK PHOS: 56 IU/L (ref 39–117)
ALT: 17 IU/L (ref 0–44)
AST: 19 IU/L (ref 0–40)
Albumin/Globulin Ratio: 1.4 (ref 1.2–2.2)
BUN/Creatinine Ratio: 14 (ref 10–24)
BUN: 15 mg/dL (ref 8–27)
Bilirubin Total: 0.8 mg/dL (ref 0.0–1.2)
CALCIUM: 9.6 mg/dL (ref 8.6–10.2)
CHLORIDE: 102 mmol/L (ref 96–106)
CO2: 24 mmol/L (ref 20–29)
CREATININE: 1.09 mg/dL (ref 0.76–1.27)
GFR, EST AFRICAN AMERICAN: 79 mL/min/{1.73_m2} (ref >59)
GFR, EST NON AFRICAN AMERICAN: 68 mL/min/{1.73_m2} (ref >59)
GLOBULIN, TOTAL: 2.9 g/dL (ref 1.5–4.5)
Glucose: 123 mg/dL — ABNORMAL HIGH (ref 65–99)
Potassium: 4.8 mmol/L (ref 3.5–5.2)
Sodium: 140 mmol/L (ref 134–144)
TOTAL PROTEIN: 7.1 g/dL (ref 6.0–8.5)

## 2017-10-10 LAB — HGB A1C W/O EAG: Hgb A1c MFr Bld: 7.8 % — ABNORMAL HIGH (ref 4.8–5.6)

## 2017-10-10 LAB — SPECIMEN STATUS REPORT

## 2017-10-16 ENCOUNTER — Ambulatory Visit (INDEPENDENT_AMBULATORY_CARE_PROVIDER_SITE_OTHER): Payer: Medicare HMO | Admitting: "Endocrinology

## 2017-10-16 ENCOUNTER — Ambulatory Visit (INDEPENDENT_AMBULATORY_CARE_PROVIDER_SITE_OTHER): Payer: Medicare HMO | Admitting: *Deleted

## 2017-10-16 ENCOUNTER — Encounter: Payer: Self-pay | Admitting: "Endocrinology

## 2017-10-16 ENCOUNTER — Ambulatory Visit: Payer: Medicare HMO | Admitting: Nutrition

## 2017-10-16 VITALS — BP 126/75 | HR 61 | Ht 71.0 in | Wt 253.0 lb

## 2017-10-16 DIAGNOSIS — I10 Essential (primary) hypertension: Secondary | ICD-10-CM

## 2017-10-16 DIAGNOSIS — E782 Mixed hyperlipidemia: Secondary | ICD-10-CM | POA: Diagnosis not present

## 2017-10-16 DIAGNOSIS — Z6837 Body mass index (BMI) 37.0-37.9, adult: Secondary | ICD-10-CM | POA: Diagnosis not present

## 2017-10-16 DIAGNOSIS — I442 Atrioventricular block, complete: Secondary | ICD-10-CM | POA: Diagnosis not present

## 2017-10-16 DIAGNOSIS — E1159 Type 2 diabetes mellitus with other circulatory complications: Secondary | ICD-10-CM

## 2017-10-16 MED ORDER — DULAGLUTIDE 1.5 MG/0.5ML ~~LOC~~ SOAJ: SUBCUTANEOUS | 3 refills | Status: DC

## 2017-10-16 NOTE — Progress Notes (Signed)
Remote pacemaker transmission.   

## 2017-10-16 NOTE — Patient Instructions (Signed)

## 2017-10-16 NOTE — Progress Notes (Signed)
Consult Note       10/16/2017, 8:35 AM   Subjective:    Patient ID: John [name], male    DOB: Apr 10, 1945.  John [name] is being seen in consultation for management of currently uncontrolled symptomatic diabetes requested by  Mikey Kirschner, MD.   Past Medical History:  Diagnosis Date  . Atrial flutter ([place]) 12/2010   Admitted with symptomatic bradycardia (HR 40s), atrial flutter with slow ventricular response 12/2010 + volume overload; AV nodal agents d/c'd and Pradaxa started; RFA in 01/2011  . CHF (congestive heart failure) ([place])   . Chronic combined systolic and diastolic heart failure (HCC)    echo 01/06/11: mild LVH, EF 65-70%, mod to severe LAE, mild RVE, mild RAE, PASP 32;   TEE 10/12: EF 45-50%   . Diabetes mellitus    non insulin dependant  . Hyperlipidemia   . Hypertension   . Osteoarthritis   . Presence of permanent cardiac pacemaker   . PSVT (paroxysmal supraventricular tachycardia) (HCC)    Possibly atrial flutter   Past Surgical History:  Procedure Laterality Date  . ATRIAL FLUTTER ABLATION N/A 02/07/2011   Procedure: ATRIAL FLUTTER ABLATION;  Surgeon: Evans Lance, MD;  Location: [facility] CATH [facility];  Service: Cardiovascular;  Laterality: N/A;  . CARDIAC ELECTROPHYSIOLOGY STUDY AND ABLATION  02/07/11  . CARDIOVERSION N/A 03/23/2016   Procedure: CARDIOVERSION;  Surgeon: Evans Lance, MD;  Location: [place];  Service: Cardiovascular;  Laterality: N/A;  . COLONOSCOPY N/A 04/17/2017   Procedure: COLONOSCOPY;  Surgeon: Rogene Houston, MD;  [facility];  Service: Endoscopy;  Laterality: N/A;  830  . EP IMPLANTABLE DEVICE N/A 08/29/2015   Procedure: Pacemaker Implant;  Surgeon: Evans Lance, MD;  Location: [place] CV LAB;  Service: Cardiovascular;  Laterality: N/A;  . EP IMPLANTABLE DEVICE N/A 12/07/2015   Procedure: PPM Lead Revision/Repair;  Surgeon: Evans Lance, MD;  Location: [place] CV LAB;  Service: Cardiovascular;  Laterality: N/A;  . KNEE ARTHROSCOPY  2005   left  . LUMBAR SPINE SURGERY     "I've had 6 ORs 1972 thru 2004"  . POLYPECTOMY  04/17/2017   Procedure: POLYPECTOMY;  Surgeon: Rogene Houston, MD;  Location: [facility];  Service: Endoscopy;;  transverse colon x3;   Social History   Socioeconomic History  . Marital status: Married    Spouse name: Not on file  . Number of children: Not on file  . Years of education: Not on file  . Highest education level: Not on file  Occupational History  . Occupation: Immunologist: RETIRED  Social Needs  . Financial resource strain: Not on file  . Food insecurity:    Worry: Not on file    Inability: Not on file  . Transportation needs:    Medical: Not on file    Non-medical: Not on file  Tobacco Use  . Smoking status: Former Smoker    Packs/day: 1.00    Years: 10.00    Pack years: 10.00    Types: Cigarettes    Last attempt to quit: 03/26/1986  Years since quitting: 31.5  . Smokeless tobacco: Never Used  . Tobacco comment: "stopped cigarette  smoking 1988"  Substance and Sexual Activity  . Alcohol use: Yes    Alcohol/week: 0.0 oz    Comment: "quit alcohol ~ 2007"  . Drug use: No  . Sexual activity: Yes    Partners: Female  Lifestyle  . Physical activity:    Days per week: Not on file    Minutes per session: Not on file  . Stress: Not on file  Relationships  . Social connections:    Talks on phone: Not on file    Gets together: Not on file    Attends religious service: Not on file    Active member of club or organization: Not on file    Attends meetings of clubs or organizations: Not on file    Relationship status: Not on file  Other Topics Concern  . Not on file  Social History Narrative  . Not on file   Outpatient Encounter Medications as of 10/16/2017  Medication Sig  . amLODipine (NORVASC) 10 MG tablet One daily  . apixaban  (ELIQUIS) 5 MG TABS tablet TAKE 1 TABLET BY MOUTH 2 (TWO) TIMES DAILY.  . benazepril (LOTENSIN) 40 MG tablet Take 1 tablet (40 mg total) by mouth daily.  . carvedilol (COREG) 25 MG tablet TAKE 1 TABLET (25 MG TOTAL) BY MOUTH 2 (TWO) TIMES DAILY WITH A MEAL.  . Dulaglutide (TRULICITY) 1.5 GY/1.7CB SOPN INJECT  1.5MG  INTO  THE  SKIN EVERY WEEK  . furosemide (LASIX) 40 MG tablet TAKE 1 TABLET TWICE DAILY (Patient taking differently: Take 40 mg by mouth twice daily)  . glucose blood test strip 1 each by Other route as needed. Use as instructed bid  . Insulin Degludec (TRESIBA FLEXTOUCH) 200 UNIT/ML SOPN Inject 70 Units into the skin at bedtime.  . Magnesium 400 MG CAPS Take 400 mg by mouth daily.   . metFORMIN (GLUCOPHAGE) 1000 MG tablet Take 1 tablet (1,000 mg total) by mouth 2 (two) times daily with a meal.  . Multiple Vitamins-Minerals (MULTIVITAMIN WITH MINERALS) tablet Take 1 tablet by mouth daily.  Marland Kitchen Resveratrol 100 MG CAPS Take 100 mg by mouth daily.   . rosuvastatin (CRESTOR) 20 MG tablet Take 1 tablet (20 mg total) by mouth daily.  . tamsulosin (FLOMAX) 0.4 MG CAPS capsule Take 1 capsule (0.4 mg total) by mouth at bedtime.  . [DISCONTINUED] hydrALAZINE (APRESOLINE) 25 MG tablet Take 1 tablet (25 mg total) by mouth 3 (three) times daily.  . [DISCONTINUED] TRULICITY 1.5 SW/9.6PR SOPN INJECT  1.5MG  INTO  THE  SKIN EVERY WEEK   No facility-administered encounter medications on file as of 10/16/2017.     ALLERGIES: No Known Allergies  VACCINATION STATUS: Immunization History  Administered Date(s) Administered  . Influenza,inj,Quad PF,6+ Mos 11/24/2014, 11/29/2015, 12/11/2016  . Influenza-Unspecified 11/24/2013  . Pneumococcal Conjugate-13 11/19/2013  . Pneumococcal Polysaccharide-23 11/29/2015  . Pneumococcal-Unspecified 03/24/2009  . Td 06/23/2010    Diabetes  He presents for his follow-up diabetic visit. He has type 2 diabetes mellitus. Onset time: He was diagnosed at approximate  age of 72 years. His disease course has been improving. There are no hypoglycemic associated symptoms. Pertinent negatives for hypoglycemia include no confusion, headaches, pallor or seizures. There are no diabetic associated symptoms. Pertinent negatives for diabetes include no chest pain, no fatigue, no polydipsia, no polyphagia, no polyuria and no weakness. There are no hypoglycemic complications. Symptoms are improving. Diabetic complications  include heart disease. Risk factors for coronary artery disease include diabetes mellitus, dyslipidemia, family history, obesity, male sex, hypertension, sedentary lifestyle and tobacco exposure. Current diabetic treatment includes insulin injections. His weight is increasing steadily. He is following a generally unhealthy diet. When asked about meal planning, he reported none. He has not had a previous visit with a dietitian. He never participates in exercise. His home blood glucose trend is decreasing steadily. His breakfast blood glucose range is generally 140-180 mg/dl. His bedtime blood glucose range is generally 140-180 mg/dl. His overall blood glucose range is 140-180 mg/dl. An ACE inhibitor/angiotensin II receptor blocker is being taken. He does not see a podiatrist.Eye exam is current.  Hyperlipidemia  This is a chronic problem. The current episode started more than 1 year ago. The problem is controlled. Exacerbating diseases include diabetes and obesity. Pertinent negatives include no chest pain, myalgias or shortness of breath. Current antihyperlipidemic treatment includes statins. Risk factors for coronary artery disease include diabetes mellitus, dyslipidemia, family history, obesity, male sex, hypertension and a sedentary lifestyle.  Hypertension  This is a chronic problem. The current episode started more than 1 year ago. The problem is controlled. Pertinent negatives include no chest pain, headaches, neck pain, palpitations or shortness of breath. Risk  factors for coronary artery disease include dyslipidemia, family history, male gender, obesity, sedentary lifestyle and smoking/tobacco exposure. Past treatments include ACE inhibitors.      Review of Systems  Constitutional: Negative for chills, fatigue, fever and unexpected weight change.  HENT: Negative for dental problem, mouth sores and trouble swallowing.   Eyes: Negative for visual disturbance.  Respiratory: Negative for cough, choking, chest tightness, shortness of breath and wheezing.   Cardiovascular: Negative for chest pain, palpitations and leg swelling.  Gastrointestinal: Negative for abdominal distention, abdominal pain, constipation, diarrhea, nausea and vomiting.  Endocrine: Negative for polydipsia, polyphagia and polyuria.  Genitourinary: Negative for dysuria, flank pain, hematuria and urgency.  Musculoskeletal: Negative for back pain, gait problem, myalgias and neck pain.  Skin: Negative for pallor, rash and wound.  Neurological: Negative for seizures, syncope, weakness, numbness and headaches.  Psychiatric/Behavioral: Negative.  Negative for confusion and dysphoric mood.    Objective:    BP 126/75   Pulse 61   Ht 5' 11"  (1.803 m)   Wt 253 lb (114.8 kg)   BMI 35.29 kg/m   Wt Readings from Last 3 Encounters:  10/16/17 253 lb (114.8 kg)  07/17/17 248 lb (112.5 kg)  07/17/17 248 lb (112.5 kg)     Physical Exam  Constitutional: He is oriented to person, place, and time. He appears well-developed. He is cooperative. No distress.  HENT:  Head: Normocephalic and atraumatic.  Eyes: EOM are normal.  Neck: Normal range of motion. Neck supple. No tracheal deviation present. No thyromegaly present.  Cardiovascular: Normal rate, S1 normal and S2 normal. Exam reveals no gallop.  No murmur heard. Pulses:      Dorsalis pedis pulses are 1+ on the right side, and 1+ on the left side.       Posterior tibial pulses are 1+ on the right side, and 1+ on the left side.   Pulmonary/Chest: Effort normal. No respiratory distress. He has no wheezes.  Abdominal: Bowel sounds are normal. He exhibits no distension. There is no tenderness. There is no guarding and no CVA tenderness.  Musculoskeletal: He exhibits no edema.       Right shoulder: He exhibits no swelling and no deformity.  Neurological: He is alert and  oriented to person, place, and time. He has normal strength. No cranial nerve deficit or sensory deficit. Gait normal.  Skin: Skin is warm and dry. No rash noted. No cyanosis. Nails show no clubbing.  Psychiatric: He has a normal mood and affect. His speech is normal. Cognition and memory are normal.    CMP ( most recent) CMP     Component Value Date/Time   NA 140 10/09/2017 0831   K 4.8 10/09/2017 0831   CL 102 10/09/2017 0831   CO2 24 10/09/2017 0831   GLUCOSE 123 (H) 10/09/2017 0831   GLUCOSE 198 (H) 03/23/2016 0711   BUN 15 10/09/2017 0831   CREATININE 1.09 10/09/2017 0831   CREATININE 1.32 (H) 03/07/2016 1043   CALCIUM 9.6 10/09/2017 0831   PROT 7.1 10/09/2017 0831   ALBUMIN 4.2 10/09/2017 0831   AST 19 10/09/2017 0831   ALT 17 10/09/2017 0831   ALKPHOS 56 10/09/2017 0831   BILITOT 0.8 10/09/2017 0831   GFRNONAA 68 10/09/2017 0831   GFRAA 79 10/09/2017 0831     Diabetic Labs (most recent): Lab Results  Component Value Date   HGBA1C 7.8 (H) 10/09/2017   HGBA1C 8.6 (H) 07/10/2017   HGBA1C 8.6 04/11/2017     Lipid Panel     Component Value Date/Time   CHOL 156 06/13/2017 0824   TRIG 214 (H) 06/13/2017 0824   HDL 49 06/13/2017 0824   CHOLHDL 3.2 06/13/2017 0824   CHOLHDL 3.2 11/16/2013 0702   VLDL 45 (H) 11/16/2013 0702   LDLCALC 64 06/13/2017 0824      Lab Results  Component Value Date   TSH 1.940 07/10/2017   TSH 1.563 08/25/2015   TSH 1.206 01/05/2011   FREET4 1.12 07/10/2017        Assessment & Plan:   1. DM type 2 causing vascular disease ([place])  - John [name] has currently uncontrolled symptomatic  type 2 DM since 72 years of age. -He came with significant improvement in his glycemic profile and A1c of 7.8%,  improving from 8.6%.  He did not document or report any hypoglycemia.    Recent labs reviewed, showing improving renal function.  -his diabetes is complicated by obesity/sedentary life , congestive heart failure/bradyarrhythmia and John [name] remains at a high risk for more acute and chronic complications which include CAD, CVA, CKD, retinopathy, and neuropathy. These are all discussed in detail with the patient.  - I have counseled him on diet management and weight loss, by adopting a carbohydrate restricted/protein rich diet.  -  Suggestion is made for him to avoid simple carbohydrates  from his diet including Cakes, Sweet Desserts / Pastries, Ice Cream, Soda (diet and regular), Sweet Tea, Candies, Chips, Cookies, Store Bought Juices, Alcohol in Excess of  1-2 drinks a day, Artificial Sweeteners, and "Sugar-free" Products. This will help patient to have stable blood glucose profile and potentially avoid unintended weight gain.   - I encouraged him to switch to  unprocessed or minimally processed complex starch and increased protein intake (animal or plant source), fruits, and vegetables.  - he is advised to stick to a routine mealtimes to eat 3 meals  a day and avoid unnecessary snacks ( to snack only to correct hypoglycemia).   - he has been  scheduled with Jearld Fenton, RDN, CDE for individualized diabetes education.  - I have approached him with the following individualized plan to manage diabetes and patient agrees:   -Based on his current glycemic response, he  will not require prandial insulin for now.    -  I advised him to continue Tresiba 70 units nightly,   associated with strict monitoring of blood glucose 2 times a day-daily before breakfast and at bedtime. - Patient is warned not to take insulin without proper monitoring per orders.  -Patient is encouraged to  call clinic for blood glucose levels less than 70 or above 300 mg /dl. -He is advised to continue metformin 1000 mg p.o. twice daily-after breakfast and after supper.    -I advised him to continue Trulicity 1.5 mg subcutaneously weekly , side effects and precautions discussed with him.   - Patient specific target  A1c;  LDL, HDL, Triglycerides, and  Waist Circumference were discussed in detail.  2) BP/HTN: His blood pressure is controlled to target.  He is advised to continue his current medications including benazepril 40 mg p.o. daily.   3) Lipids/HPL:    His recent lipid panel shows controlled LDL at 53.  He is advised to continue Crestor 20 mg p.o. Nightly.  4)  Weight/Diet: CDE Consult has been initiated , exercise, and detailed carbohydrates information provided.  5) Chronic Care/Health Maintenance:  -he  is on ACEI/ARB and Statin medications and  is encouraged to continue to follow up with Ophthalmology, Dentist,  Podiatrist at least yearly or according to recommendations, and advised to   stay away from smoking. I have recommended yearly flu vaccine and pneumonia vaccination at least every 5 years; moderate intensity exercise for up to 150 minutes weekly; and  sleep for at least 7 hours a day.  - I advised patient to maintain close follow up with Mikey Kirschner, MD for primary care needs.  - Time spent with the patient: 25 min, of which >50% was spent in reviewing his blood glucose logs , discussing his hypo- and hyper-glycemic episodes, reviewing his current and  previous labs and insulin doses and developing a plan to avoid hypo- and hyper-glycemia. Please refer to Patient Instructions for Blood Glucose Monitoring and Insulin/Medications Dosing Guide"  in media tab for additional information. John [name] participated in the discussions, expressed understanding, and voiced agreement with the above plans.  All questions were answered to his satisfaction. he is encouraged to contact  clinic should he have any questions or concerns prior to his return visit.  Follow up plan: - Return in about 4 months (around 02/16/2018) for meter, and logs, follow up with pre-visit labs, meter, and logs.  Glade Lloyd, MD [facility] [address] [place],  [address] Phone: [phone]  Fax: [phone]    10/16/2017, 8:35 AM  This note was partially dictated with voice recognition software. Similar sounding words can be transcribed inadequately or may not  be corrected upon review.

## 2017-10-31 ENCOUNTER — Other Ambulatory Visit: Payer: Self-pay | Admitting: "Endocrinology

## 2017-11-14 ENCOUNTER — Other Ambulatory Visit: Payer: Self-pay | Admitting: Family Medicine

## 2017-11-25 LAB — CUP PACEART REMOTE DEVICE CHECK
Battery Impedance: 111 Ohm
Battery Remaining Longevity: 128 mo
Battery Voltage: 2.79 V
Brady Statistic AP VP Percent: 17 %
Brady Statistic AS VP Percent: 82 %
Implantable Lead Implant Date: 20170605
Implantable Lead Location: 753859
Implantable Lead Location: 753860
Implantable Lead Model: 5076
Implantable Lead Model: 5076
Implantable Pulse Generator Implant Date: 20170605
Lead Channel Impedance Value: 531 Ohm
Lead Channel Pacing Threshold Amplitude: 0.75 V
Lead Channel Pacing Threshold Amplitude: 0.875 V
Lead Channel Pacing Threshold Pulse Width: 0.4 ms
Lead Channel Setting Pacing Amplitude: 2 V
Lead Channel Setting Pacing Amplitude: 2.5 V
Lead Channel Setting Pacing Pulse Width: 0.4 ms
MDC IDC LEAD IMPLANT DT: 20170605
MDC IDC MSMT LEADCHNL RA PACING THRESHOLD PULSEWIDTH: 0.4 ms
MDC IDC MSMT LEADCHNL RV IMPEDANCE VALUE: 445 Ohm
MDC IDC SESS DTM: 20190724110502
MDC IDC SET LEADCHNL RV SENSING SENSITIVITY: 4 mV
MDC IDC STAT BRADY AP VS PERCENT: 0 %
MDC IDC STAT BRADY AS VS PERCENT: 1 %

## 2017-11-28 DIAGNOSIS — H40013 Open angle with borderline findings, low risk, bilateral: Secondary | ICD-10-CM | POA: Diagnosis not present

## 2017-12-04 ENCOUNTER — Encounter: Payer: Medicare HMO | Admitting: Internal Medicine

## 2017-12-05 ENCOUNTER — Other Ambulatory Visit: Payer: Self-pay | Admitting: Family Medicine

## 2017-12-12 ENCOUNTER — Encounter: Payer: Medicare HMO | Admitting: Family Medicine

## 2017-12-25 ENCOUNTER — Encounter: Payer: Self-pay | Admitting: Family Medicine

## 2017-12-25 ENCOUNTER — Ambulatory Visit (INDEPENDENT_AMBULATORY_CARE_PROVIDER_SITE_OTHER): Payer: Medicare HMO | Admitting: Family Medicine

## 2017-12-25 VITALS — BP 130/80 | Ht 71.0 in | Wt 257.4 lb

## 2017-12-25 DIAGNOSIS — Z13220 Encounter for screening for lipoid disorders: Secondary | ICD-10-CM | POA: Diagnosis not present

## 2017-12-25 DIAGNOSIS — E119 Type 2 diabetes mellitus without complications: Secondary | ICD-10-CM

## 2017-12-25 DIAGNOSIS — Z0000 Encounter for general adult medical examination without abnormal findings: Secondary | ICD-10-CM

## 2017-12-25 DIAGNOSIS — Z23 Encounter for immunization: Secondary | ICD-10-CM

## 2017-12-25 DIAGNOSIS — Z125 Encounter for screening for malignant neoplasm of prostate: Secondary | ICD-10-CM | POA: Diagnosis not present

## 2017-12-25 DIAGNOSIS — I10 Essential (primary) hypertension: Secondary | ICD-10-CM

## 2017-12-25 MED ORDER — AMLODIPINE BESYLATE 10 MG PO TABS: ORAL_TABLET | ORAL | 1 refills | Status: DC

## 2017-12-25 MED ORDER — TAMSULOSIN HCL 0.4 MG PO CAPS: 0.4000 mg | ORAL_CAPSULE | Freq: Every day | ORAL | 1 refills | Status: DC

## 2017-12-25 MED ORDER — ROSUVASTATIN CALCIUM 20 MG PO TABS: 20.0000 mg | ORAL_TABLET | Freq: Every day | ORAL | 1 refills | Status: DC

## 2017-12-25 MED ORDER — BENAZEPRIL HCL 40 MG PO TABS: 40.0000 mg | ORAL_TABLET | Freq: Every day | ORAL | 1 refills | Status: DC

## 2017-12-25 MED ORDER — ZOSTER VAC RECOMB ADJUVANTED 50 MCG/0.5ML IM SUSR: 0.5000 mL | Freq: Once | INTRAMUSCULAR | 1 refills | Status: AC

## 2017-12-25 NOTE — Progress Notes (Signed)
   Subjective:    Patient ID: John [name], male    DOB: 12-04-1945, 72 y.o.   MRN: [id number]  HPI  The patient comes in today for a wellness visit.    A review of their health history was completed.  A review of medications was also completed.  Any needed refills; refills on blood pressure meds  Eating habits: trying to eat healthy  Falls/  MVA accidents in past few months: none  Mini cog-pass  Regular exercise: walking and gardening  Specialist pt sees on regular basis: Dr Dorris Fetch for diabetes and Dr Lovena Le for heart  Preventative health issues were discussed.   Additional concerns: follow up on blood pressure    Walking some and fall garden does not walk as much as he used to    Blood pressure medicine and blood pressure levels reviewed today with patient. Compliant with blood pressure medicine. States does not miss a dose. No obvious side effects. Blood pressure generally good when checked elsewhere. Watching salt intake.    Review of Systems  Constitutional: Negative for activity change, appetite change and fever.  HENT: Negative for congestion and rhinorrhea.   Eyes: Negative for discharge.  Respiratory: Negative for cough and wheezing.   Cardiovascular: Negative for chest pain.  Gastrointestinal: Negative for abdominal pain, blood in stool and vomiting.  Genitourinary: Negative for difficulty urinating and frequency.  Musculoskeletal: Negative for neck pain.  Skin: Negative for rash.  Allergic/Immunologic: Negative for environmental allergies and food allergies.  Neurological: Negative for weakness and headaches.  Psychiatric/Behavioral: Negative for agitation.  All other systems reviewed and are negative.      Objective:   Physical Exam  Constitutional: He appears well-developed and well-nourished.  HENT:  Head: Normocephalic and atraumatic.  Right Ear: External ear normal.  Left Ear: External ear normal.  Nose: Nose normal.  Mouth/Throat:  Oropharynx is clear and moist.  Eyes: Pupils are equal, round, and reactive to light. EOM are normal.  Neck: Normal range of motion. Neck supple. No thyromegaly present.  Cardiovascular: Normal rate, regular rhythm and normal heart sounds.  No murmur heard. Pulmonary/Chest: Effort normal and breath sounds normal. No respiratory distress. He has no wheezes.  Abdominal: Soft. Bowel sounds are normal. He exhibits no distension and no mass. There is no tenderness.  Genitourinary: Penis normal.  Musculoskeletal: Normal range of motion. He exhibits no edema.  Lymphadenopathy:    He has no cervical adenopathy.  Neurological: He is alert. He exhibits normal muscle tone.  Skin: Skin is warm and dry. No erythema.  Psychiatric: He has a normal mood and affect. His behavior is normal. Judgment normal.  Vitals reviewed.         Assessment & Plan:  1 impression wellness exam.  Diet discussed.  Exercise discussed.  Up-to-date on colonoscopy.  Next due 2024 flu shot today   Due next 2024  2.  Hypertension.  Good control.  Discussed.  To maintain same meds rationale discussed  Appropriate blood work.  Further recommendations based on results

## 2018-01-08 ENCOUNTER — Other Ambulatory Visit: Payer: Self-pay | Admitting: "Endocrinology

## 2018-01-15 ENCOUNTER — Other Ambulatory Visit: Payer: Self-pay | Admitting: "Endocrinology

## 2018-01-15 ENCOUNTER — Ambulatory Visit (INDEPENDENT_AMBULATORY_CARE_PROVIDER_SITE_OTHER): Payer: Medicare HMO | Admitting: *Deleted

## 2018-01-15 DIAGNOSIS — I442 Atrioventricular block, complete: Secondary | ICD-10-CM | POA: Diagnosis not present

## 2018-01-15 NOTE — Progress Notes (Signed)
Remote pacemaker transmission.   

## 2018-01-17 DIAGNOSIS — Z125 Encounter for screening for malignant neoplasm of prostate: Secondary | ICD-10-CM | POA: Diagnosis not present

## 2018-01-17 DIAGNOSIS — E119 Type 2 diabetes mellitus without complications: Secondary | ICD-10-CM | POA: Diagnosis not present

## 2018-01-17 DIAGNOSIS — I10 Essential (primary) hypertension: Secondary | ICD-10-CM | POA: Diagnosis not present

## 2018-01-18 LAB — LIPID PANEL
CHOLESTEROL TOTAL: 142 mg/dL (ref 100–199)
Chol/HDL Ratio: 2.7 ratio (ref 0.0–5.0)
HDL: 53 mg/dL (ref >39)
LDL Calculated: 71 mg/dL (ref 0–99)
TRIGLYCERIDES: 90 mg/dL (ref 0–149)
VLDL CHOLESTEROL CAL: 18 mg/dL (ref 5–40)

## 2018-01-18 LAB — MICROALBUMIN / CREATININE URINE RATIO
Creatinine, Urine: 24.4 mg/dL
Microalbumin, Urine: <3 ug/mL

## 2018-01-18 LAB — PSA: Prostate Specific Ag, Serum: 1.3 ng/mL (ref 0.0–4.0)

## 2018-01-19 ENCOUNTER — Encounter: Payer: Self-pay | Admitting: Family Medicine

## 2018-01-20 ENCOUNTER — Other Ambulatory Visit: Payer: Self-pay | Admitting: "Endocrinology

## 2018-01-23 ENCOUNTER — Encounter: Payer: Self-pay | Admitting: Cardiovascular Disease

## 2018-01-23 ENCOUNTER — Telehealth: Payer: Self-pay | Admitting: Cardiovascular Disease

## 2018-01-23 ENCOUNTER — Ambulatory Visit (INDEPENDENT_AMBULATORY_CARE_PROVIDER_SITE_OTHER): Payer: Medicare HMO | Admitting: Cardiovascular Disease

## 2018-01-23 VITALS — BP 116/68 | HR 87 | Ht 71.5 in | Wt 266.0 lb

## 2018-01-23 DIAGNOSIS — I4819 Other persistent atrial fibrillation: Secondary | ICD-10-CM

## 2018-01-23 DIAGNOSIS — Z950 Presence of cardiac pacemaker: Secondary | ICD-10-CM

## 2018-01-23 DIAGNOSIS — I5022 Chronic systolic (congestive) heart failure: Secondary | ICD-10-CM | POA: Diagnosis not present

## 2018-01-23 DIAGNOSIS — I10 Essential (primary) hypertension: Secondary | ICD-10-CM

## 2018-01-23 DIAGNOSIS — I350 Nonrheumatic aortic (valve) stenosis: Secondary | ICD-10-CM | POA: Diagnosis not present

## 2018-01-23 DIAGNOSIS — I442 Atrioventricular block, complete: Secondary | ICD-10-CM

## 2018-01-23 DIAGNOSIS — E7800 Pure hypercholesterolemia, unspecified: Secondary | ICD-10-CM | POA: Diagnosis not present

## 2018-01-23 NOTE — Progress Notes (Signed)
SUBJECTIVE: The patient presents for routine follow-up. He has a history of high-grade heart block status post permanent pacemaker insertion, atrial flutter status post ablation, and paroxysmal atrial fibrillation status post cardioversion on 03/23/16. He also has chronic systolic heart failure and aortic stenosis.  He denies chest pain, palpitations, orthopnea, dizziness, and paroxysmal nocturnal dyspnea.  He seldom has shortness of breath and this occurs primarily when taking his trash cans to the end of the driveway and back.  Sometimes he skips Lasix because he does not want to urinate when going out and notices increased leg swelling and shortness of breath at those times.  He denies bleeding problems with Eliquis.  ECG performed in the office today which I ordered and personally interpreted demonstrates ventricular pacing with PVCs.   Review of Systems: As per "subjective", otherwise negative.  No Known Allergies  Current Outpatient Medications  Medication Sig Dispense Refill  . amLODipine (NORVASC) 10 MG tablet One daily 90 tablet 1  . apixaban (ELIQUIS) 5 MG TABS tablet TAKE 1 TABLET BY MOUTH 2 (TWO) TIMES DAILY. 180 tablet 3  . benazepril (LOTENSIN) 40 MG tablet Take 1 tablet (40 mg total) by mouth daily. 90 tablet 1  . carvedilol (COREG) 25 MG tablet TAKE 1 TABLET (25 MG TOTAL) BY MOUTH 2 (TWO) TIMES DAILY WITH A MEAL. 180 tablet 3  . furosemide (LASIX) 40 MG tablet TAKE 1 TABLET TWICE DAILY (Patient taking differently: Take 40 mg by mouth twice daily) 180 tablet 3  . glucose blood test strip 1 each by Other route as needed. Use as instructed bid 100 each 3  . Magnesium 400 MG CAPS Take 400 mg by mouth daily.     . metFORMIN (GLUCOPHAGE) 1000 MG tablet TAKE 1 TABLET TWICE DAILY WITH MEALS 180 tablet 0  . Multiple Vitamins-Minerals (MULTIVITAMIN WITH MINERALS) tablet Take 1 tablet by mouth daily.    Marland Kitchen Resveratrol 100 MG CAPS Take 100 mg by mouth daily.     . rosuvastatin  (CRESTOR) 20 MG tablet Take 1 tablet (20 mg total) by mouth daily. 90 tablet 1  . tamsulosin (FLOMAX) 0.4 MG CAPS capsule Take 1 capsule (0.4 mg total) by mouth at bedtime. 90 capsule 1  . TRESIBA FLEXTOUCH 200 UNIT/ML SOPN INJECT 70 UNITS INTO THE SKIN AT BEDTIME 36 mL 0  . TRULICITY 1.5 JJ/9.4RD SOPN INJECT  1.5MG (CONTENTS OF 1 PEN)  INTO  THE  SKIN ONE TIME WEEKLY 15 mL 1   No current facility-administered medications for this visit.     Past Medical History:  Diagnosis Date  . Atrial flutter ([place]) 12/2010   Admitted with symptomatic bradycardia (HR 40s), atrial flutter with slow ventricular response 12/2010 + volume overload; AV nodal agents d/c'd and Pradaxa started; RFA in 01/2011  . CHF (congestive heart failure) ([place])   . Chronic combined systolic and diastolic heart failure (HCC)    echo 01/06/11: mild LVH, EF 65-70%, mod to severe LAE, mild RVE, mild RAE, PASP 32;   TEE 10/12: EF 45-50%   . Diabetes mellitus    non insulin dependant  . Hyperlipidemia   . Hypertension   . Osteoarthritis   . Presence of permanent cardiac pacemaker   . PSVT (paroxysmal supraventricular tachycardia) (HCC)    Possibly atrial flutter    Past Surgical History:  Procedure Laterality Date  . ATRIAL FLUTTER ABLATION N/A 02/07/2011   Procedure: ATRIAL FLUTTER ABLATION;  Surgeon: Evans Lance, MD;  Location: [facility] CATH  LAB;  Service: Cardiovascular;  Laterality: N/A;  . CARDIAC ELECTROPHYSIOLOGY STUDY AND ABLATION  02/07/11  . CARDIOVERSION N/A 03/23/2016   Procedure: CARDIOVERSION;  Surgeon: Evans Lance, MD;  Location: [place];  Service: Cardiovascular;  Laterality: N/A;  . COLONOSCOPY N/A 04/17/2017   Procedure: COLONOSCOPY;  Surgeon: Rogene Houston, MD;  [facility];  Service: Endoscopy;  Laterality: N/A;  830  . EP IMPLANTABLE DEVICE N/A 08/29/2015   Procedure: Pacemaker Implant;  Surgeon: Evans Lance, MD;  Location: [place] CV LAB;  Service: Cardiovascular;  Laterality:  N/A;  . EP IMPLANTABLE DEVICE N/A 12/07/2015   Procedure: PPM Lead Revision/Repair;  Surgeon: Evans Lance, MD;  Location: [place] CV LAB;  Service: Cardiovascular;  Laterality: N/A;  . KNEE ARTHROSCOPY  2005   left  . LUMBAR SPINE SURGERY     "I've had 6 ORs 1972 thru 2004"  . POLYPECTOMY  04/17/2017   Procedure: POLYPECTOMY;  Surgeon: Rogene Houston, MD;  [facility];  Service: Endoscopy;;  transverse colon x3;    Social History   Socioeconomic History  . Marital status: Married    Spouse name: Not on file  . Number of children: Not on file  . Years of education: Not on file  . Highest education level: Not on file  Occupational History  . Occupation: Immunologist: RETIRED  Social Needs  . Financial resource strain: Not on file  . Food insecurity:    Worry: Not on file    Inability: Not on file  . Transportation needs:    Medical: Not on file    Non-medical: Not on file  Tobacco Use  . Smoking status: Former Smoker    Packs/day: 1.00    Years: 10.00    Pack years: 10.00    Types: Cigarettes    Last attempt to quit: 03/26/1986    Years since quitting: 31.8  . Smokeless tobacco: Never Used  . Tobacco comment: "stopped cigarette  smoking 1988"  Substance and Sexual Activity  . Alcohol use: Yes    Alcohol/week: 0.0 standard drinks    Comment: "quit alcohol ~ 2007"  . Drug use: No  . Sexual activity: Yes    Partners: Female  Lifestyle  . Physical activity:    Days per week: Not on file    Minutes per session: Not on file  . Stress: Not on file  Relationships  . Social connections:    Talks on phone: Not on file    Gets together: Not on file    Attends religious service: Not on file    Active member of club or organization: Not on file    Attends meetings of clubs or organizations: Not on file    Relationship status: Not on file  . Intimate partner violence:    Fear of current or ex partner: Not on file    Emotionally  abused: Not on file    Physically abused: Not on file    Forced sexual activity: Not on file  Other Topics Concern  . Not on file  Social History Narrative  . Not on file     Vitals:   01/23/18 0818  BP: 116/68  Pulse: 87  SpO2: 95%  Weight: 266 lb (120.7 kg)  Height: 5' 11.5" (1.816 m)    Wt Readings from Last 3 Encounters:  01/23/18 266 lb (120.7 kg)  12/25/17 257 lb 6.4 oz (116.8 kg)  10/16/17 253  lb (114.8 kg)     PHYSICAL EXAM General: NAD HEENT: Normal. Neck: No JVD, no thyromegaly. Lungs: Clear to auscultation bilaterally with normal respiratory effort. CV: Regular rate and rhythm, normal S1/S2, no S3/S4, no murmur. No pretibial or periankle edema.  No carotid bruit.   Abdomen: Soft, nontender, no distention.  Neurologic: Alert and oriented.  Psych: Normal affect. Skin: Normal. Musculoskeletal: No gross deformities.    ECG: Reviewed above under Subjective   Labs: Lab Results  Component Value Date/Time   K 4.8 10/09/2017 08:31 AM   BUN 15 10/09/2017 08:31 AM   CREATININE 1.09 10/09/2017 08:31 AM   CREATININE 1.32 (H) 03/07/2016 10:43 AM   ALT 17 10/09/2017 08:31 AM   TSH 1.940 07/10/2017 08:04 AM   HGB 13.9 03/23/2016 07:11 AM     Lipids: Lab Results  Component Value Date/Time   LDLCALC 71 01/17/2018 08:35 AM   CHOL 142 01/17/2018 08:35 AM   TRIG 90 01/17/2018 08:35 AM   HDL 53 01/17/2018 08:35 AM       ASSESSMENT AND PLAN:  1. Persistent atrial fibrillation: Continue carvedilol and Eliquis. Symptomaticallystable s/p DCCV in 02/2016.  2. Chronic systolic heart failure/cardiomyopathy: LVEF 40-45% on 11/22/15. Euvolemic on Lasix 40 mg twice daily. Continue carvedilol and benazepril.  I will obtain a follow-up echocardiogram to assess for interval changes.  3. Pacemaker s/p RA/RV lead revision 11/2015: Stable. Normal device function. Continue follow up with EP.  4. Hypertension: Controlled on present therapy. No changes.  5.  Hyperlipidemia: Continue statin.  6. Aortic stenosis: Mild in 10/2015. Stable.  I will obtain a follow-up echocardiogram to assess for interval changes.    Disposition: Follow up 1 year   [name], M.D., F.A.C.C.

## 2018-01-23 NOTE — Telephone Encounter (Signed)
°  Precert needed for: Echo    Location: [facility]     Date: Jan 27, 2018

## 2018-01-23 NOTE — Patient Instructions (Signed)
Medication Instructions:  Your physician recommends that you continue on your current medications as directed. Please refer to the Current Medication list given to you today.  If you need a refill on your cardiac medications before your next appointment, please call your pharmacy.   Lab work: None If you have labs (blood work) drawn today and your tests are completely normal, you will receive your results only by: Marland Kitchen MyChart Message (if you have MyChart) OR . A paper copy in the mail If you have any lab test that is abnormal or we need to change your treatment, we will call you to review the results.  Testing/Procedures: Your physician has requested that you have an echocardiogram. Echocardiography is a painless test that uses sound waves to create images of your heart. It provides your doctor with information about the size and shape of your heart and how well your heart's chambers and valves are working. This procedure takes approximately one hour. There are no restrictions for this procedure.    Follow-Up: At [facility], you and your health needs are our priority.  As part of our continuing mission to provide you with exceptional heart care, we have created designated Provider Care Teams.  These Care Teams include your primary Cardiologist (physician) and Advanced Practice Providers (APPs -  Physician Assistants and Nurse Practitioners) who all work together to provide you with the care you need, when you need it. You will need a follow up appointment in 1 years.  Please call our office 2 months in advance to schedule this appointment.  You may see [name], MD or one of the following Advanced Practice Providers on your designated Care Team:   Bernerd Pho, PA-C [facility]) . Ermalinda Barrios, PA-C ([place])  Any Other Special Instructions Will Be Listed Below (If Applicable). None

## 2018-01-27 ENCOUNTER — Ambulatory Visit (HOSPITAL_COMMUNITY)
Admission: RE | Admit: 2018-01-27 | Discharge: 2018-01-27 | Disposition: A | Discharge status: Home / Self Care | Payer: Medicare HMO | Source: Ambulatory Visit | Attending: Cardiovascular Disease | Admitting: Cardiovascular Disease

## 2018-01-27 DIAGNOSIS — I350 Nonrheumatic aortic (valve) stenosis: Secondary | ICD-10-CM | POA: Diagnosis not present

## 2018-01-27 DIAGNOSIS — I5022 Chronic systolic (congestive) heart failure: Secondary | ICD-10-CM

## 2018-01-27 NOTE — Progress Notes (Signed)
*  PRELIMINARY RESULTS* Echocardiogram 2D Echocardiogram has been performed.  Samuel Germany 01/27/2018, 9:26 AM

## 2018-01-31 ENCOUNTER — Encounter: Payer: Self-pay | Admitting: Internal Medicine

## 2018-01-31 ENCOUNTER — Ambulatory Visit (INDEPENDENT_AMBULATORY_CARE_PROVIDER_SITE_OTHER): Payer: Medicare HMO | Admitting: Internal Medicine

## 2018-01-31 VITALS — BP 120/72 | HR 72 | Ht 71.5 in | Wt 262.0 lb

## 2018-01-31 DIAGNOSIS — I4892 Unspecified atrial flutter: Secondary | ICD-10-CM | POA: Diagnosis not present

## 2018-01-31 DIAGNOSIS — I4819 Other persistent atrial fibrillation: Secondary | ICD-10-CM

## 2018-01-31 DIAGNOSIS — Z950 Presence of cardiac pacemaker: Secondary | ICD-10-CM

## 2018-01-31 DIAGNOSIS — I442 Atrioventricular block, complete: Secondary | ICD-10-CM

## 2018-01-31 DIAGNOSIS — I5022 Chronic systolic (congestive) heart failure: Secondary | ICD-10-CM | POA: Diagnosis not present

## 2018-01-31 DIAGNOSIS — I110 Hypertensive heart disease with heart failure: Secondary | ICD-10-CM

## 2018-01-31 NOTE — Progress Notes (Signed)
HPI John [name] returns today for follow-up. He is a 72 year old man with a history of high-grade heart block, status post permanent pacemaker insertion, with a history of atrial flutter status post ablation, with a history of paroxysmal atrial fibrillation status post cardioversion. In the interim, he is done well. He denies palpitations, chest pain, or shortness of breath. Minimal peripheral edema. No syncope. He has recently had a 2D echo which shows that his AS is only mild. He is walking and has class 2 symptoms. No edema.   No Known Allergies   Current Outpatient Medications  Medication Sig Dispense Refill  . amLODipine (NORVASC) 10 MG tablet One daily 90 tablet 1  . apixaban (ELIQUIS) 5 MG TABS tablet TAKE 1 TABLET BY MOUTH 2 (TWO) TIMES DAILY. 180 tablet 3  . benazepril (LOTENSIN) 40 MG tablet Take 1 tablet (40 mg total) by mouth daily. 90 tablet 1  . carvedilol (COREG) 25 MG tablet TAKE 1 TABLET (25 MG TOTAL) BY MOUTH 2 (TWO) TIMES DAILY WITH A MEAL. 180 tablet 3  . furosemide (LASIX) 40 MG tablet TAKE 1 TABLET TWICE DAILY (Patient taking differently: Take 40 mg by mouth twice daily) 180 tablet 3  . glucose blood test strip 1 each by Other route as needed. Use as instructed bid 100 each 3  . Magnesium 400 MG CAPS Take 400 mg by mouth daily.     . metFORMIN (GLUCOPHAGE) 1000 MG tablet TAKE 1 TABLET TWICE DAILY WITH MEALS 180 tablet 0  . Multiple Vitamins-Minerals (MULTIVITAMIN WITH MINERALS) tablet Take 1 tablet by mouth daily.    Marland Kitchen Resveratrol 100 MG CAPS Take 100 mg by mouth daily.     . rosuvastatin (CRESTOR) 20 MG tablet Take 1 tablet (20 mg total) by mouth daily. 90 tablet 1  . tamsulosin (FLOMAX) 0.4 MG CAPS capsule Take 1 capsule (0.4 mg total) by mouth at bedtime. 90 capsule 1  . TRESIBA FLEXTOUCH 200 UNIT/ML SOPN INJECT 70 UNITS INTO THE SKIN AT BEDTIME 36 mL 0  . TRULICITY 1.5 WP/8.0DX SOPN INJECT  1.5MG (CONTENTS OF 1 PEN)  INTO  THE  SKIN ONE TIME WEEKLY 15 mL 1    No current facility-administered medications for this visit.      Past Medical History:  Diagnosis Date  . Atrial flutter ([place]) 12/2010   Admitted with symptomatic bradycardia (HR 40s), atrial flutter with slow ventricular response 12/2010 + volume overload; AV nodal agents d/c'd and Pradaxa started; RFA in 01/2011  . CHF (congestive heart failure) ([place])   . Chronic combined systolic and diastolic heart failure (HCC)    echo 01/06/11: mild LVH, EF 65-70%, mod to severe LAE, mild RVE, mild RAE, PASP 32;   TEE 10/12: EF 45-50%   . Diabetes mellitus    non insulin dependant  . Hyperlipidemia   . Hypertension   . Osteoarthritis   . Presence of permanent cardiac pacemaker   . PSVT (paroxysmal supraventricular tachycardia) (HCC)    Possibly atrial flutter    ROS:   All systems reviewed and negative except as noted in the HPI.   Past Surgical History:  Procedure Laterality Date  . ATRIAL FLUTTER ABLATION N/A 02/07/2011   Procedure: ATRIAL FLUTTER ABLATION;  Surgeon: Evans Lance, MD;  Location: [facility] CATH LAB;  Service: Cardiovascular;  Laterality: N/A;  . CARDIAC ELECTROPHYSIOLOGY STUDY AND ABLATION  02/07/11  . CARDIOVERSION N/A 03/23/2016   Procedure: CARDIOVERSION;  Surgeon: Evans Lance, MD;  Location: [facility]  ENDOSCOPY;  Service: Cardiovascular;  Laterality: N/A;  . COLONOSCOPY N/A 04/17/2017   Procedure: COLONOSCOPY;  Surgeon: Rogene Houston, MD;  [facility];  Service: Endoscopy;  Laterality: N/A;  830  . EP IMPLANTABLE DEVICE N/A 08/29/2015   Procedure: Pacemaker Implant;  Surgeon: Evans Lance, MD;  Location: [place] CV LAB;  Service: Cardiovascular;  Laterality: N/A;  . EP IMPLANTABLE DEVICE N/A 12/07/2015   Procedure: PPM Lead Revision/Repair;  Surgeon: Evans Lance, MD;  Location: [place] CV LAB;  Service: Cardiovascular;  Laterality: N/A;  . KNEE ARTHROSCOPY  2005   left  . LUMBAR SPINE SURGERY     "I've had 6 ORs 1972 thru 2004"  . POLYPECTOMY   04/17/2017   Procedure: POLYPECTOMY;  Surgeon: Rogene Houston, MD;  [facility];  Service: Endoscopy;;  transverse colon x3;     Family History  Problem Relation Age of Onset  . Heart attack Mother   . Hypertension Mother   . Heart attack Father   . Hypertension Father   . Heart attack Brother   . Colon cancer Neg Hx      Social History   Socioeconomic History  . Marital status: Married    Spouse name: Not on file  . Number of children: Not on file  . Years of education: Not on file  . Highest education level: Not on file  Occupational History  . Occupation: Immunologist: RETIRED  Social Needs  . Financial resource strain: Not on file  . Food insecurity:    Worry: Not on file    Inability: Not on file  . Transportation needs:    Medical: Not on file    Non-medical: Not on file  Tobacco Use  . Smoking status: Former Smoker    Packs/day: 1.00    Years: 10.00    Pack years: 10.00    Types: Cigarettes    Last attempt to quit: 03/26/1986    Years since quitting: 31.8  . Smokeless tobacco: Never Used  . Tobacco comment: "stopped cigarette  smoking 1988"  Substance and Sexual Activity  . Alcohol use: Yes    Alcohol/week: 0.0 standard drinks    Comment: "quit alcohol ~ 2007"  . Drug use: No  . Sexual activity: Yes    Partners: Female  Lifestyle  . Physical activity:    Days per week: Not on file    Minutes per session: Not on file  . Stress: Not on file  Relationships  . Social connections:    Talks on phone: Not on file    Gets together: Not on file    Attends religious service: Not on file    Active member of club or organization: Not on file    Attends meetings of clubs or organizations: Not on file    Relationship status: Not on file  . Intimate partner violence:    Fear of current or ex partner: Not on file    Emotionally abused: Not on file    Physically abused: Not on file    Forced sexual activity: Not on file  Other  Topics Concern  . Not on file  Social History Narrative  . Not on file     Ht 5' 11.5" (1.816 m)   Wt 262 lb (118.8 kg)   BMI 36.03 kg/m   Physical Exam:  Well appearing NAD HEENT: Unremarkable Neck:  No JVD, no thyromegally Lymphatics:  No adenopathy Back:  No CVA tenderness Lungs:  Clear with no wheezes HEART:  Regular rate rhythm, no murmurs, no rubs, no clicks Abd:  soft, positive bowel sounds, no organomegally, no rebound, no guarding Ext:  2 plus pulses, no edema, no cyanosis, no clubbing Skin:  No rashes no nodules Neuro:  CN II through XII intact, motor grossly intact  DEVICE  Normal device function.  See PaceArt for details.   Assess/Plan: 1. CHB - he is asymptomatic, s/p PPM insertion. 2. PAF - he is now in atrial fib all of the time and cannot tell. His rate is controlled and he will remain on systemic anti-coagulation. 3. HTN - his blood pressure is controlled today. We will follow.  4. PPM - his medtronic DDD PM is working normally. We will recheck in several months.  Mikle Bosworth.D.

## 2018-01-31 NOTE — Patient Instructions (Signed)
Medication Instructions:  Your physician recommends that you continue on your current medications as directed. Please refer to the Current Medication list given to you today.  Labwork: None ordered.  Testing/Procedures: None ordered.  Follow-Up: Your physician wants you to follow-up in: one year with Dr.Taylor in [place].   You will receive a reminder letter in the mail two months in advance. If you don't receive a letter, please call our office to schedule the follow-up appointment.  Remote monitoring is used to monitor your Pacemaker from home. This monitoring reduces the number of office visits required to check your device to one time per year. It allows [place] to keep an eye on the functioning of your device to ensure it is working properly. You are scheduled for a device check from home on 04/16/2018. You may send your transmission at any time that day. If you have a wireless device, the transmission will be sent automatically. After your physician reviews your transmission, you will receive a postcard with your next transmission date.  Any Other Special Instructions Will Be Listed Below (If Applicable).  If you need a refill on your cardiac medications before your next appointment, please call your pharmacy.

## 2018-02-05 LAB — CUP PACEART INCLINIC DEVICE CHECK
Battery Impedance: 135 Ohm
Battery Voltage: 2.79 V
Brady Statistic AP VS Percent: 0 %
Implantable Lead Implant Date: 20170605
Implantable Lead Location: 753859
Implantable Lead Model: 5076
Lead Channel Impedance Value: 396 Ohm
Lead Channel Impedance Value: 494 Ohm
Lead Channel Pacing Threshold Amplitude: 0.625 V
Lead Channel Pacing Threshold Amplitude: 0.75 V
Lead Channel Pacing Threshold Amplitude: 0.875 V
Lead Channel Pacing Threshold Pulse Width: 0.4 ms
Lead Channel Sensing Intrinsic Amplitude: 2 mV
Lead Channel Setting Pacing Amplitude: 2 V
Lead Channel Setting Pacing Amplitude: 2.5 V
Lead Channel Setting Pacing Pulse Width: 0.4 ms
Lead Channel Setting Sensing Sensitivity: 4 mV
MDC IDC LEAD IMPLANT DT: 20170605
MDC IDC LEAD LOCATION: 753860
MDC IDC MSMT BATTERY REMAINING LONGEVITY: 118 mo
MDC IDC MSMT LEADCHNL RA PACING THRESHOLD PULSEWIDTH: 0.4 ms
MDC IDC MSMT LEADCHNL RV PACING THRESHOLD PULSEWIDTH: 0.4 ms
MDC IDC PG IMPLANT DT: 20170605
MDC IDC SESS DTM: 20191108135636
MDC IDC STAT BRADY AP VP PERCENT: 17 %
MDC IDC STAT BRADY AS VP PERCENT: 82 %
MDC IDC STAT BRADY AS VS PERCENT: 1 %

## 2018-02-06 LAB — CUP PACEART REMOTE DEVICE CHECK
Battery Remaining Longevity: 117 mo
Date Time Interrogation Session: 20191023120113
Implantable Lead Implant Date: 20170605
Implantable Lead Implant Date: 20170605
Implantable Lead Location: 753859
Implantable Lead Location: 753860
Implantable Lead Model: 5076
Implantable Pulse Generator Implant Date: 20170605
Lead Channel Pacing Threshold Amplitude: 0.625 V
Lead Channel Pacing Threshold Amplitude: 0.875 V
Lead Channel Setting Pacing Amplitude: 2 V
Lead Channel Setting Pacing Pulse Width: 0.4 ms
MDC IDC MSMT BATTERY IMPEDANCE: 135 Ohm
MDC IDC MSMT BATTERY VOLTAGE: 2.79 V
MDC IDC MSMT LEADCHNL RA IMPEDANCE VALUE: 494 Ohm
MDC IDC MSMT LEADCHNL RA PACING THRESHOLD PULSEWIDTH: 0.4 ms
MDC IDC MSMT LEADCHNL RA SENSING INTR AMPL: 1.4 mV
MDC IDC MSMT LEADCHNL RV IMPEDANCE VALUE: 389 Ohm
MDC IDC MSMT LEADCHNL RV PACING THRESHOLD PULSEWIDTH: 0.4 ms
MDC IDC SET LEADCHNL RV PACING AMPLITUDE: 2.5 V
MDC IDC SET LEADCHNL RV SENSING SENSITIVITY: 4 mV
MDC IDC STAT BRADY AP VP PERCENT: 17 %
MDC IDC STAT BRADY AP VS PERCENT: 0 %
MDC IDC STAT BRADY AS VP PERCENT: 82 %
MDC IDC STAT BRADY AS VS PERCENT: 1 %

## 2018-02-10 ENCOUNTER — Other Ambulatory Visit: Payer: Self-pay | Admitting: Cardiovascular Disease

## 2018-02-10 ENCOUNTER — Other Ambulatory Visit: Payer: Self-pay | Admitting: "Endocrinology

## 2018-02-10 DIAGNOSIS — E1159 Type 2 diabetes mellitus with other circulatory complications: Secondary | ICD-10-CM | POA: Diagnosis not present

## 2018-02-11 LAB — COMPREHENSIVE METABOLIC PANEL
A/G RATIO: 1.2 (ref 1.2–2.2)
ALK PHOS: 78 IU/L (ref 39–117)
ALT: 20 IU/L (ref 0–44)
AST: 24 IU/L (ref 0–40)
Albumin: 3.8 g/dL (ref 3.5–4.8)
BILIRUBIN TOTAL: 1.2 mg/dL (ref 0.0–1.2)
BUN/Creatinine Ratio: 14 (ref 10–24)
BUN: 15 mg/dL (ref 8–27)
CO2: 23 mmol/L (ref 20–29)
Calcium: 8.8 mg/dL (ref 8.6–10.2)
Chloride: 103 mmol/L (ref 96–106)
Creatinine, Ser: 1.11 mg/dL (ref 0.76–1.27)
GFR calc Af Amer: 76 mL/min/{1.73_m2} (ref >59)
GFR calc non Af Amer: 66 mL/min/{1.73_m2} (ref >59)
GLOBULIN, TOTAL: 3.2 g/dL (ref 1.5–4.5)
Glucose: 94 mg/dL (ref 65–99)
POTASSIUM: 4 mmol/L (ref 3.5–5.2)
SODIUM: 141 mmol/L (ref 134–144)
Total Protein: 7 g/dL (ref 6.0–8.5)

## 2018-02-11 LAB — SPECIMEN STATUS REPORT

## 2018-02-11 LAB — HGB A1C W/O EAG: HEMOGLOBIN A1C: 7.4 % — AB (ref 4.8–5.6)

## 2018-02-17 ENCOUNTER — Ambulatory Visit (INDEPENDENT_AMBULATORY_CARE_PROVIDER_SITE_OTHER): Payer: Medicare HMO | Admitting: "Endocrinology

## 2018-02-17 ENCOUNTER — Encounter: Payer: Self-pay | Admitting: "Endocrinology

## 2018-02-17 VITALS — BP 114/60 | HR 90 | Ht 71.5 in | Wt 266.0 lb

## 2018-02-17 DIAGNOSIS — I10 Essential (primary) hypertension: Secondary | ICD-10-CM | POA: Diagnosis not present

## 2018-02-17 DIAGNOSIS — E782 Mixed hyperlipidemia: Secondary | ICD-10-CM

## 2018-02-17 DIAGNOSIS — E1159 Type 2 diabetes mellitus with other circulatory complications: Secondary | ICD-10-CM | POA: Diagnosis not present

## 2018-02-17 DIAGNOSIS — Z6837 Body mass index (BMI) 37.0-37.9, adult: Secondary | ICD-10-CM

## 2018-02-17 NOTE — Patient Instructions (Signed)

## 2018-02-17 NOTE — Progress Notes (Signed)
Endocrinology follow-up note       02/17/2018, 9:50 AM   Subjective:    Patient ID: John [name], male    DOB: 11/07/1945.  John [name] is being seen in follow-up  for management of currently uncontrolled symptomatic type 2 diabetes, hyperlipidemia, hypertension. PMD:  Mikey Kirschner, MD.   Past Medical History:  Diagnosis Date  . Atrial flutter ([place]) 12/2010   Admitted with symptomatic bradycardia (HR 40s), atrial flutter with slow ventricular response 12/2010 + volume overload; AV nodal agents d/c'd and Pradaxa started; RFA in 01/2011  . CHF (congestive heart failure) ([place])   . Chronic combined systolic and diastolic heart failure (HCC)    echo 01/06/11: mild LVH, EF 65-70%, mod to severe LAE, mild RVE, mild RAE, PASP 32;   TEE 10/12: EF 45-50%   . Diabetes mellitus    non insulin dependant  . Hyperlipidemia   . Hypertension   . Osteoarthritis   . Presence of permanent cardiac pacemaker   . PSVT (paroxysmal supraventricular tachycardia) (HCC)    Possibly atrial flutter   Past Surgical History:  Procedure Laterality Date  . ATRIAL FLUTTER ABLATION N/A 02/07/2011   Procedure: ATRIAL FLUTTER ABLATION;  Surgeon: Evans Lance, MD;  Location: [facility] CATH [facility];  Service: Cardiovascular;  Laterality: N/A;  . CARDIAC ELECTROPHYSIOLOGY STUDY AND ABLATION  02/07/11  . CARDIOVERSION N/A 03/23/2016   Procedure: CARDIOVERSION;  Surgeon: Evans Lance, MD;  Location: [place];  Service: Cardiovascular;  Laterality: N/A;  . COLONOSCOPY N/A 04/17/2017   Procedure: COLONOSCOPY;  Surgeon: Rogene Houston, MD;  [facility];  Service: Endoscopy;  Laterality: N/A;  830  . EP IMPLANTABLE DEVICE N/A 08/29/2015   Procedure: Pacemaker Implant;  Surgeon: Evans Lance, MD;  Location: [place] CV LAB;  Service: Cardiovascular;  Laterality: N/A;  . EP IMPLANTABLE DEVICE N/A 12/07/2015   Procedure:  PPM Lead Revision/Repair;  Surgeon: Evans Lance, MD;  Location: [place] [facility];  Service: Cardiovascular;  Laterality: N/A;  . KNEE ARTHROSCOPY  2005   left  . LUMBAR SPINE SURGERY     "I've had 6 ORs 1972 thru 2004"  . POLYPECTOMY  04/17/2017   Procedure: POLYPECTOMY;  Surgeon: Rogene Houston, MD;  Location: [facility];  Service: Endoscopy;;  transverse colon x3;   Social History   Socioeconomic History  . Marital status: Married    Spouse name: Not on file  . Number of children: Not on file  . Years of education: Not on file  . Highest education level: Not on file  Occupational History  . Occupation: Immunologist: RETIRED  Social Needs  . Financial resource strain: Not on file  . Food insecurity:    Worry: Not on file    Inability: Not on file  . Transportation needs:    Medical: Not on file    Non-medical: Not on file  Tobacco Use  . Smoking status: Former Smoker    Packs/day: 1.00    Years: 10.00    Pack years: 10.00    Types: Cigarettes  Last attempt to quit: 03/26/1986    Years since quitting: 31.9  . Smokeless tobacco: Never Used  . Tobacco comment: "stopped cigarette  smoking 1988"  Substance and Sexual Activity  . Alcohol use: Yes    Alcohol/week: 0.0 standard drinks    Comment: "quit alcohol ~ 2007"  . Drug use: No  . Sexual activity: Yes    Partners: Female  Lifestyle  . Physical activity:    Days per week: Not on file    Minutes per session: Not on file  . Stress: Not on file  Relationships  . Social connections:    Talks on phone: Not on file    Gets together: Not on file    Attends religious service: Not on file    Active member of club or organization: Not on file    Attends meetings of clubs or organizations: Not on file    Relationship status: Not on file  Other Topics Concern  . Not on file  Social History Narrative  . Not on file   Outpatient Encounter Medications as of 02/17/2018  Medication Sig  .  amLODipine (NORVASC) 10 MG tablet One daily  . apixaban (ELIQUIS) 5 MG TABS tablet TAKE 1 TABLET BY MOUTH 2 (TWO) TIMES DAILY.  . benazepril (LOTENSIN) 40 MG tablet Take 1 tablet (40 mg total) by mouth daily.  . carvedilol (COREG) 25 MG tablet TAKE 1 TABLET (25 MG TOTAL) BY MOUTH 2 (TWO) TIMES DAILY WITH A MEAL.  . furosemide (LASIX) 40 MG tablet TAKE 1 TABLET TWICE DAILY  . glucose blood test strip 1 each by Other route as needed. Use as instructed bid  . Magnesium 400 MG CAPS Take 400 mg by mouth daily.   . metFORMIN (GLUCOPHAGE) 1000 MG tablet TAKE 1 TABLET TWICE DAILY WITH MEALS  . Multiple Vitamins-Minerals (MULTIVITAMIN WITH MINERALS) tablet Take 1 tablet by mouth daily.  Marland Kitchen Resveratrol 100 MG CAPS Take 100 mg by mouth daily.   . rosuvastatin (CRESTOR) 20 MG tablet Take 1 tablet (20 mg total) by mouth daily.  . tamsulosin (FLOMAX) 0.4 MG CAPS capsule Take 1 capsule (0.4 mg total) by mouth at bedtime.  . TRESIBA FLEXTOUCH 200 UNIT/ML SOPN INJECT 70 UNITS INTO THE SKIN AT BEDTIME  . TRULICITY 1.5 VC/9.4WH SOPN INJECT  1.5MG (CONTENTS OF 1 PEN)  INTO  THE  SKIN ONE TIME WEEKLY  . [DISCONTINUED] hydrALAZINE (APRESOLINE) 25 MG tablet Take 1 tablet (25 mg total) by mouth 3 (three) times daily.   No facility-administered encounter medications on file as of 02/17/2018.     ALLERGIES: No Known Allergies  VACCINATION STATUS: Immunization History  Administered Date(s) Administered  . Influenza,inj,Quad PF,6+ Mos 11/24/2014, 11/29/2015, 12/11/2016, 12/25/2017  . Influenza-Unspecified 11/24/2013  . Pneumococcal Conjugate-13 11/19/2013  . Pneumococcal Polysaccharide-23 11/29/2015  . Pneumococcal-Unspecified 03/24/2009  . Td 06/23/2010    Diabetes  He presents for his follow-up diabetic visit. He has type 2 diabetes mellitus. Onset time: He was diagnosed at approximate age of 47 years. His disease course has been improving. There are no hypoglycemic associated symptoms. Pertinent negatives  for hypoglycemia include no confusion, headaches, pallor or seizures. There are no diabetic associated symptoms. Pertinent negatives for diabetes include no chest pain, no fatigue, no polydipsia, no polyphagia, no polyuria and no weakness. There are no hypoglycemic complications. Symptoms are improving. Diabetic complications include heart disease. Risk factors for coronary artery disease include diabetes mellitus, dyslipidemia, family history, obesity, male sex, hypertension, sedentary lifestyle and tobacco exposure.  Current diabetic treatment includes insulin injections. His weight is increasing steadily. He is following a generally unhealthy diet. When asked about meal planning, he reported none. He has not had a previous visit with a dietitian. He never participates in exercise. His home blood glucose trend is decreasing steadily. His breakfast blood glucose range is generally 140-180 mg/dl. His bedtime blood glucose range is generally 140-180 mg/dl. His overall blood glucose range is 140-180 mg/dl. An ACE inhibitor/angiotensin II receptor blocker is being taken. He does not see a podiatrist.Eye exam is current.  Hyperlipidemia  This is a chronic problem. The current episode started more than 1 year ago. The problem is controlled. Exacerbating diseases include diabetes and obesity. Pertinent negatives include no chest pain, myalgias or shortness of breath. Current antihyperlipidemic treatment includes statins. Risk factors for coronary artery disease include diabetes mellitus, dyslipidemia, family history, obesity, male sex, hypertension and a sedentary lifestyle.  Hypertension  This is a chronic problem. The current episode started more than 1 year ago. The problem is controlled. Pertinent negatives include no chest pain, headaches, neck pain, palpitations or shortness of breath. Risk factors for coronary artery disease include dyslipidemia, family history, male gender, obesity, sedentary lifestyle and  smoking/tobacco exposure. Past treatments include ACE inhibitors.    Review of Systems  Constitutional: Negative for chills, fatigue, fever and unexpected weight change.  HENT: Negative for dental problem, mouth sores and trouble swallowing.   Eyes: Negative for visual disturbance.  Respiratory: Negative for cough, choking, chest tightness, shortness of breath and wheezing.   Cardiovascular: Negative for chest pain, palpitations and leg swelling.  Gastrointestinal: Negative for abdominal distention, abdominal pain, constipation, diarrhea, nausea and vomiting.  Endocrine: Negative for polydipsia, polyphagia and polyuria.  Genitourinary: Negative for dysuria, flank pain, hematuria and urgency.  Musculoskeletal: Negative for back pain, gait problem, myalgias and neck pain.  Skin: Negative for pallor, rash and wound.  Neurological: Negative for seizures, syncope, weakness, numbness and headaches.  Psychiatric/Behavioral: Negative.  Negative for confusion and dysphoric mood.    Objective:    BP 114/60   Pulse 90   Ht 5' 11.5" (1.816 m)   Wt 266 lb (120.7 kg)   BMI 36.58 kg/m   Wt Readings from Last 3 Encounters:  02/17/18 266 lb (120.7 kg)  01/31/18 262 lb (118.8 kg)  01/23/18 266 lb (120.7 kg)     Physical Exam  Constitutional: He is oriented to person, place, and time. He appears well-developed. He is cooperative. No distress.  HENT:  Head: Normocephalic and atraumatic.  Eyes: EOM are normal.  Neck: Normal range of motion. Neck supple. No tracheal deviation present. No thyromegaly present.  Cardiovascular: Normal rate, S1 normal and S2 normal. Exam reveals no gallop.  No murmur heard. Pulses:      Dorsalis pedis pulses are 1+ on the right side, and 1+ on the left side.       Posterior tibial pulses are 1+ on the right side, and 1+ on the left side.  Pulmonary/Chest: Effort normal. No respiratory distress. He has no wheezes.  Abdominal: Bowel sounds are normal. He exhibits no  distension. There is no tenderness. There is no guarding and no CVA tenderness.  Musculoskeletal: He exhibits no edema.       Right shoulder: He exhibits no swelling and no deformity.  Neurological: He is alert and oriented to person, place, and time. He has normal strength. No cranial nerve deficit or sensory deficit. Gait normal.  Skin: Skin is warm and dry.  No rash noted. No cyanosis. Nails show no clubbing.  Psychiatric: He has a normal mood and affect. His speech is normal. Cognition and memory are normal.    CMP ( most recent) CMP     Component Value Date/Time   NA 141 02/10/2018 0900   K 4.0 02/10/2018 0900   CL 103 02/10/2018 0900   CO2 23 02/10/2018 0900   GLUCOSE 94 02/10/2018 0900   GLUCOSE 198 (H) 03/23/2016 0711   BUN 15 02/10/2018 0900   CREATININE 1.11 02/10/2018 0900   CREATININE 1.32 (H) 03/07/2016 1043   CALCIUM 8.8 02/10/2018 0900   PROT 7.0 02/10/2018 0900   ALBUMIN 3.8 02/10/2018 0900   AST 24 02/10/2018 0900   ALT 20 02/10/2018 0900   ALKPHOS 78 02/10/2018 0900   BILITOT 1.2 02/10/2018 0900   GFRNONAA 66 02/10/2018 0900   GFRAA 76 02/10/2018 0900     Diabetic Labs (most recent): Lab Results  Component Value Date   HGBA1C 7.4 (H) 02/10/2018   HGBA1C 7.8 (H) 10/09/2017   HGBA1C 8.6 (H) 07/10/2017     Lipid Panel     Component Value Date/Time   CHOL 142 01/17/2018 0835   TRIG 90 01/17/2018 0835   HDL 53 01/17/2018 0835   CHOLHDL 2.7 01/17/2018 0835   CHOLHDL 3.2 11/16/2013 0702   VLDL 45 (H) 11/16/2013 0702   LDLCALC 71 01/17/2018 0835      Lab Results  Component Value Date   TSH 1.940 07/10/2017   TSH 1.563 08/25/2015   TSH 1.206 01/05/2011   FREET4 1.12 07/10/2017        Assessment & Plan:   1. DM type 2 causing vascular disease ([place])  - John [name] has currently uncontrolled symptomatic type 2 DM since 72 years of age. -He came with continued improvement in his glycemic profile with A1c of 7.4%, overall improving from  8.6%.    - He did not document or report any hypoglycemia.    Recent labs reviewed, showing improving renal function.  -his diabetes is complicated by obesity/sedentary life , congestive heart failure/bradyarrhythmia and John [name] remains at a high risk for more acute and chronic complications which include CAD, CVA, CKD, retinopathy, and neuropathy. These are all discussed in detail with the patient.  - I have counseled him on diet management and weight loss, by adopting a carbohydrate restricted/protein rich diet.   -  Suggestion is made for him to avoid simple carbohydrates  from his diet including Cakes, Sweet Desserts / Pastries, Ice Cream, Soda (diet and regular), Sweet Tea, Candies, Chips, Cookies, Store Bought Juices, Alcohol in Excess of  1-2 drinks a day, Artificial Sweeteners, and "Sugar-free" Products. This will help patient to have stable blood glucose profile and potentially avoid unintended weight gain.   - I encouraged him to switch to  unprocessed or minimally processed complex starch and increased protein intake (animal or plant source), fruits, and vegetables.  - he is advised to stick to a routine mealtimes to eat 3 meals  a day and avoid unnecessary snacks ( to snack only to correct hypoglycemia).   - he has been  scheduled with Jearld Fenton, RDN, CDE for individualized diabetes education.  - I have approached him with the following individualized plan to manage diabetes and patient agrees:   -Based on his current glycemic response, he will not require prandial insulin for now.    -He is advised to continue Tresiba 70 units nightly,   associated with  strict monitoring of blood glucose 2 times a day-daily before breakfast and at bedtime. - Patient is warned not to take insulin without proper monitoring per orders.  -Patient is encouraged to call clinic for blood glucose levels less than 70 or above 200 mg /dl. -He is advised to continue metformin 1000 mg p.o.  twice daily-after breakfast and after supper.    -He is advised to continue Trulicity 1.5 mg subcutaneously weekly , side effects and precautions discussed with him.   - Patient specific target  A1c;  LDL, HDL, Triglycerides, and  Waist Circumference were discussed in detail.  2) BP/HTN: His blood pressure is controlled to target.   He is advised to continue his current medications including benazepril 40 mg p.o. daily.   3) Lipids/HPL:    His recent lipid panel shows controlled LDL at 53.  He is advised to continue Crestor 20 mg p.o. Nightly.  4)  Weight/Diet: CDE Consult has been initiated , exercise, and detailed carbohydrates information provided.  5) Chronic Care/Health Maintenance:  -he  is on ACEI/ARB and Statin medications and  is encouraged to continue to follow up with Ophthalmology, Dentist,  Podiatrist at least yearly or according to recommendations, and advised to   stay away from smoking. I have recommended yearly flu vaccine and pneumonia vaccination at least every 5 years; moderate intensity exercise for up to 150 minutes weekly; and  sleep for at least 7 hours a day.  - I advised patient to maintain close follow up with Mikey Kirschner, MD for primary care needs.  - Time spent with the patient: 25 min, of which >50% was spent in reviewing his blood glucose logs , discussing his hypo- and hyper-glycemic episodes, reviewing his current and  previous labs and insulin doses and developing a plan to avoid hypo- and hyper-glycemia. Please refer to Patient Instructions for Blood Glucose Monitoring and Insulin/Medications Dosing Guide"  in media tab for additional information. John [name] participated in the discussions, expressed understanding, and voiced agreement with the above plans.  All questions were answered to his satisfaction. he is encouraged to contact clinic should he have any questions or concerns prior to his return visit.   Follow up plan: - Return in about 4  months (around 06/18/2018) for Meter, and Logs.  Glade Lloyd, MD [facility] [address] [place], [place] [address] Phone: [phone]  Fax: [phone]    02/17/2018, 9:50 AM  This note was partially dictated with voice recognition software. Similar sounding words can be transcribed inadequately or may not  be corrected upon review.

## 2018-02-25 ENCOUNTER — Encounter (HOSPITAL_COMMUNITY): Payer: Self-pay | Admitting: *Deleted

## 2018-02-25 ENCOUNTER — Emergency Department (HOSPITAL_COMMUNITY)
Admission: EM | Admit: 2018-02-25 | Discharge: 2018-02-25 | Disposition: A | Discharge status: Home / Self Care | Payer: Medicare HMO | Attending: Emergency Medicine | Admitting: Emergency Medicine

## 2018-02-25 ENCOUNTER — Other Ambulatory Visit: Payer: Self-pay

## 2018-02-25 ENCOUNTER — Emergency Department (HOSPITAL_COMMUNITY): Payer: Medicare HMO

## 2018-02-25 DIAGNOSIS — Z79899 Other long term (current) drug therapy: Secondary | ICD-10-CM | POA: Insufficient documentation

## 2018-02-25 DIAGNOSIS — I5032 Chronic diastolic (congestive) heart failure: Secondary | ICD-10-CM | POA: Diagnosis not present

## 2018-02-25 DIAGNOSIS — Z7984 Long term (current) use of oral hypoglycemic drugs: Secondary | ICD-10-CM | POA: Diagnosis not present

## 2018-02-25 DIAGNOSIS — Z87891 Personal history of nicotine dependence: Secondary | ICD-10-CM | POA: Insufficient documentation

## 2018-02-25 DIAGNOSIS — I110 Hypertensive heart disease with heart failure: Secondary | ICD-10-CM | POA: Diagnosis not present

## 2018-02-25 DIAGNOSIS — I5041 Acute combined systolic (congestive) and diastolic (congestive) heart failure: Secondary | ICD-10-CM

## 2018-02-25 DIAGNOSIS — R05 Cough: Secondary | ICD-10-CM | POA: Diagnosis not present

## 2018-02-25 DIAGNOSIS — Z7901 Long term (current) use of anticoagulants: Secondary | ICD-10-CM | POA: Insufficient documentation

## 2018-02-25 DIAGNOSIS — E119 Type 2 diabetes mellitus without complications: Secondary | ICD-10-CM | POA: Diagnosis not present

## 2018-02-25 DIAGNOSIS — R0602 Shortness of breath: Secondary | ICD-10-CM | POA: Diagnosis not present

## 2018-02-25 LAB — CBC WITH DIFFERENTIAL/PLATELET
Abs Immature Granulocytes: 0.04 10*3/uL (ref 0.00–0.07)
BASOS ABS: 0 10*3/uL (ref 0.0–0.1)
Basophils Relative: 0 %
Eosinophils Absolute: 0.2 10*3/uL (ref 0.0–0.5)
Eosinophils Relative: 2 %
HCT: 36.7 % — ABNORMAL LOW (ref 39.0–52.0)
Hemoglobin: 11.5 g/dL — ABNORMAL LOW (ref 13.0–17.0)
IMMATURE GRANULOCYTES: 0 %
LYMPHS PCT: 19 %
Lymphs Abs: 1.7 10*3/uL (ref 0.7–4.0)
MCH: 26.5 pg (ref 26.0–34.0)
MCHC: 31.3 g/dL (ref 30.0–36.0)
MCV: 84.6 fL (ref 80.0–100.0)
Monocytes Absolute: 0.9 10*3/uL (ref 0.1–1.0)
Monocytes Relative: 10 %
NEUTROS PCT: 69 %
Neutro Abs: 6.2 10*3/uL (ref 1.7–7.7)
Platelets: 102 10*3/uL — ABNORMAL LOW (ref 150–400)
RBC: 4.34 MIL/uL (ref 4.22–5.81)
RDW: 17.2 % — AB (ref 11.5–15.5)
WBC: 9 10*3/uL (ref 4.0–10.5)
nRBC: 0 % (ref 0.0–0.2)

## 2018-02-25 LAB — BASIC METABOLIC PANEL
ANION GAP: 6 (ref 5–15)
BUN: 15 mg/dL (ref 8–23)
CHLORIDE: 112 mmol/L — AB (ref 98–111)
CO2: 24 mmol/L (ref 22–32)
CREATININE: 0.9 mg/dL (ref 0.61–1.24)
Calcium: 8.6 mg/dL — ABNORMAL LOW (ref 8.9–10.3)
GFR calc non Af Amer: >60 mL/min (ref >60)
Glucose, Bld: 67 mg/dL — ABNORMAL LOW (ref 70–99)
Potassium: 3.5 mmol/L (ref 3.5–5.1)
SODIUM: 142 mmol/L (ref 135–145)

## 2018-02-25 LAB — BRAIN NATRIURETIC PEPTIDE: B NATRIURETIC PEPTIDE 5: 1009 pg/mL — AB (ref 0.0–100.0)

## 2018-02-25 LAB — TROPONIN I: TROPONIN I: 0.05 ng/mL — AB (ref <0.03)

## 2018-02-25 MED ORDER — FUROSEMIDE 10 MG/ML IJ SOLN
40.0000 mg | Freq: Once | INTRAMUSCULAR | Status: AC
  Administered 2018-02-25: 40 mg via INTRAVENOUS
  Filled 2018-02-25: qty 4

## 2018-02-25 NOTE — ED Provider Notes (Signed)
[facility] EMERGENCY DEPARTMENT Provider Note   CSN: [id number] Arrival date & time: 02/25/18  4696     History   Chief Complaint Chief Complaint  Patient presents with  . Shortness of Breath    HPI John [name] is a 72 y.o. male.  HPI  This is a 72 year old male with a history of congestive heart failure, atrial flutter, diabetes who presents with shortness of breath.  Reports worsening shortness of breath over the last 2 to 3 days.  Reports orthopnea and lower extremity swelling.  Denies any chest pain or dyspnea on exertion.  Denies any fevers.  Reports a dry cough.  Does take a diuretic but denies any recent changes in medications.  Does not wear home oxygen.  Past Medical History:  Diagnosis Date  . Atrial flutter ([place]) 12/2010   Admitted with symptomatic bradycardia (HR 40s), atrial flutter with slow ventricular response 12/2010 + volume overload; AV nodal agents d/c'd and Pradaxa started; RFA in 01/2011  . CHF (congestive heart failure) ([place])   . Chronic combined systolic and diastolic heart failure (HCC)    echo 01/06/11: mild LVH, EF 65-70%, mod to severe LAE, mild RVE, mild RAE, PASP 32;   TEE 10/12: EF 45-50%   . Diabetes mellitus    non insulin dependant  . Hyperlipidemia   . Hypertension   . Osteoarthritis   . Presence of permanent cardiac pacemaker   . PSVT (paroxysmal supraventricular tachycardia) (HCC)    Possibly atrial flutter    Patient Active Problem List   Diagnosis Date Noted  . Class 2 severe obesity due to excess calories with serious comorbidity and body mass index (BMI) of 37.0 to 37.9 in adult ([place]) 07/17/2017  . History of colonic polyps 12/28/2016  . Atrial pacemaker lead displacement 12/07/2015  . Acute CHF (congestive heart failure) ([place]) 11/21/2015  . CHF, acute on chronic ([place]) 11/20/2015  . Complete heart block ([place]) 11/20/2015  . Chronic diastolic CHF (congestive heart failure) ([place]) 08/28/2015  . Hypertensive heart disease  08/28/2015  . NSVT (nonsustained ventricular tachycardia) ([place]) 08/28/2015  . Elevated troponin 08/28/2015  . Second degree heart block 08/25/2015  . Bradycardia 08/25/2015  . Acute diastolic CHF (congestive heart failure) ([place]) 08/25/2015  . Chronic anticoagulation-discontinued 05/04/2011  . Atrial flutter ([place]) 01/05/2011  . DM type 2 causing vascular disease ([place]) 12/23/2009  . Mixed hyperlipidemia 12/23/2009  . HYPERTENSION 12/23/2009  . DEGENERATIVE JOINT DISEASE 12/23/2009    Past Surgical History:  Procedure Laterality Date  . ATRIAL FLUTTER ABLATION N/A 02/07/2011   Procedure: ATRIAL FLUTTER ABLATION;  Surgeon: Evans Lance, MD;  Location: [facility] CATH LAB;  Service: Cardiovascular;  Laterality: N/A;  . CARDIAC ELECTROPHYSIOLOGY STUDY AND ABLATION  02/07/11  . CARDIOVERSION N/A 03/23/2016   Procedure: CARDIOVERSION;  Surgeon: Evans Lance, MD;  Location: [place];  Service: Cardiovascular;  Laterality: N/A;  . COLONOSCOPY N/A 04/17/2017   Procedure: COLONOSCOPY;  Surgeon: Rogene Houston, MD;  Location[facility] ENDO SU[facility];  Service: Endoscopy;  Laterality: N/A;  830  . EP IMPLANTABLE DEVICE N/A 08/29/2015   Procedure: Pacemaker Implant;  Surgeon: Evans Lance, MD;  Location: [place] CV LAB;  Service: Cardiovascular;  Laterality: N/A;  . EP IMPLANTABLE DEVICE N/A 12/07/2015   Procedure: PPM Lead Revision/Repair;  Surgeon: Evans Lance, MD;  Location: [place] CV LAB;  Service: Cardiovascular;  Laterality: N/A;  . KNEE ARTHROSCOPY  2005   left  . LUMBAR SPINE SURGERY     "  I've had 6 ORs 1972 thru 2004"  . POLYPECTOMY  04/17/2017   Procedure: POLYPECTOMY;  Surgeon: Rogene Houston, MD;  Location: [facility] ENDO [facility];  Service: Endoscopy;;  transverse colon x3;        Home Medications    Prior to Admission medications   Medication Sig Start Date End Date Taking? Authorizing Provider  amLODipine (NORVASC) 10 MG tablet One daily 12/25/17   Mikey Kirschner, MD  apixaban  (ELIQUIS) 5 MG TABS tablet TAKE 1 TABLET BY MOUTH 2 (TWO) TIMES DAILY. 06/10/17   Herminio Commons, MD  benazepril (LOTENSIN) 40 MG tablet Take 1 tablet (40 mg total) by mouth daily. 12/25/17   Mikey Kirschner, MD  carvedilol (COREG) 25 MG tablet TAKE 1 TABLET (25 MG TOTAL) BY MOUTH 2 (TWO) TIMES DAILY WITH A MEAL. 08/13/17   Herminio Commons, MD  furosemide (LASIX) 40 MG tablet TAKE 1 TABLET TWICE DAILY 02/11/18   Herminio Commons, MD  glucose blood test strip 1 each by Other route as needed. Use as instructed bid 07/23/17   Cassandria Anger, MD  Magnesium 400 MG CAPS Take 400 mg by mouth daily.     [provider]  metFORMIN (GLUCOPHAGE) 1000 MG tablet TAKE 1 TABLET TWICE DAILY WITH MEALS 01/09/18   Nida, Marella Chimes, MD  Multiple Vitamins-Minerals (MULTIVITAMIN WITH MINERALS) tablet Take 1 tablet by mouth daily.    [provider]  Resveratrol 100 MG CAPS Take 100 mg by mouth daily.     [provider]  rosuvastatin (CRESTOR) 20 MG tablet Take 1 tablet (20 mg total) by mouth daily. 12/25/17   Mikey Kirschner, MD  tamsulosin (FLOMAX) 0.4 MG CAPS capsule Take 1 capsule (0.4 mg total) by mouth at bedtime. 12/25/17   Mikey Kirschner, MD  TRESIBA FLEXTOUCH 200 UNIT/ML SOPN INJECT 70 UNITS INTO THE SKIN AT BEDTIME 01/21/18   Cassandria Anger, MD  TRULICITY 1.5 GG/8.3MO SOPN INJECT  1.5MG (CONTENTS OF 1 PEN)  INTO  THE  SKIN ONE TIME WEEKLY 01/15/18   Cassandria Anger, MD  hydrALAZINE (APRESOLINE) 25 MG tablet Take 1 tablet (25 mg total) by mouth 3 (three) times daily. 03/22/11 06/19/11  Yehuda Savannah, MD    Family History Family History  Problem Relation Age of Onset  . Heart attack Mother   . Hypertension Mother   . Heart attack Father   . Hypertension Father   . Heart attack Brother   . Colon cancer Neg Hx     Social History Social History   Tobacco Use  . Smoking status: Former Smoker    Packs/day: 1.00    Years: 10.00     Pack years: 10.00    Types: Cigarettes    Last attempt to quit: 03/26/1986    Years since quitting: 31.9  . Smokeless tobacco: Never Used  . Tobacco comment: "stopped cigarette  smoking 1988"  Substance Use Topics  . Alcohol use: Yes    Alcohol/week: 0.0 standard drinks    Comment: "quit alcohol ~ 2007"  . Drug use: No     Allergies   Patient has no known allergies.   Review of Systems Review of Systems  Constitutional: Negative for fever.  Respiratory: Positive for cough and shortness of breath.   Cardiovascular: Positive for leg swelling. Negative for chest pain.  Gastrointestinal: Negative for abdominal pain, nausea and vomiting.  Genitourinary: Negative for dysuria.  All other systems reviewed and are negative.  Physical Exam Updated Vital Signs BP 115/75   Pulse 60   Temp 98.5 F (36.9 C) (Oral)   Resp 20   Ht 1.816 m (5' 11.5")   Wt 120.6 kg   SpO2 99%   BMI 36.57 kg/m   Physical Exam  Constitutional: He is oriented to person, place, and time. He appears well-developed and well-nourished.  overweight  HENT:  Head: Normocephalic and atraumatic.  Eyes: Pupils are equal, round, and reactive to light.  Cardiovascular: Normal rate, regular rhythm and normal heart sounds.  No murmur heard. Pulmonary/Chest: Effort normal and breath sounds normal. No respiratory distress. He has no wheezes.  No respiratory distress  Abdominal: Soft. Bowel sounds are normal. There is no tenderness. There is no rebound.  Musculoskeletal:       Right lower leg: He exhibits edema.       Left lower leg: He exhibits edema.  2+ symmetric bilateral lower extremity edema  Neurological: He is alert and oriented to person, place, and time.  Skin: Skin is warm and dry.  Psychiatric: He has a normal mood and affect.  Nursing note and vitals reviewed.    ED Treatments / Results  Labs (all labs ordered are listed, but only abnormal results are displayed) Labs Reviewed  CBC WITH  DIFFERENTIAL/PLATELET - Abnormal; Notable for the following components:      Result Value   Hemoglobin 11.5 (*)    HCT 36.7 (*)    RDW 17.2 (*)    Platelets 102 (*)    All other components within normal limits  BASIC METABOLIC PANEL - Abnormal; Notable for the following components:   Chloride 112 (*)    Glucose, Bld 67 (*)    Calcium 8.6 (*)    All other components within normal limits  BRAIN NATRIURETIC PEPTIDE - Abnormal; Notable for the following components:   B Natriuretic Peptide 1,009.0 (*)    All other components within normal limits  TROPONIN I - Abnormal; Notable for the following components:   Troponin I 0.05 (*)    All other components within normal limits    EKG EKG Interpretation  Date/Time:  Tuesday February 25 2018 04:03:36 EST Ventricular Rate:  69 PR Interval:    QRS Duration: 109 QT Interval:  530 QTC Calculation: 698 R Axis:   109 Text Interpretation:  Ventricular-paced complexes No further analysis attempted due to paced rhythm No significant change since last tracing Confirmed by Thayer Jew [id number]) on 02/25/2018 4:06:21 AM   Radiology Dg Chest 2 View  Result Date: 02/25/2018 CLINICAL DATA:  72 year old male with shortness of breath. EXAM: CHEST - 2 VIEW COMPARISON:  Chest radiograph dated 05/16/2017 FINDINGS: There is mild cardiomegaly and mild vascular congestion. Diffuse interstitial prominence may represent congestion or edema. Pneumonia is less likely but not excluded. Clinical correlation is recommended. No focal consolidation, pleural effusion, or pneumothorax. Left pectoral pacemaker device. No acute osseous pathology. IMPRESSION: Cardiomegaly with mild vascular congestion and interstitial edema. Pneumonia is less likely. Clinical correlation is recommended. Electronically Signed   By: Anner Crete M.D.   On: 02/25/2018 04:22    Procedures Procedures (including critical care time)  Medications Ordered in ED Medications  furosemide  (LASIX) injection 40 mg (40 mg Intravenous Given 02/25/18 0609)     Initial Impression / Assessment and Plan / ED Course  I have reviewed the triage vital signs and the nursing notes.  Pertinent labs & imaging results that were available during my care of the patient were  reviewed by me and considered in my medical decision making (see chart for details).  Clinical Course as of Feb 25 633  Tue Feb 25, 2018  0619 Patient remains stable.  Work-up notable for some mild pulmonary edema on chest x-ray and elevated BNP.  Troponin is at the patient's baseline.  EKG shows no signs of acute ischemia.  He is not having active chest pain.  Suspect volume overload and CHF.  Patient was given 1 dose of IV Lasix.  He was able to ambulate and maintain pulse ox in the mid 90s.  He appeared comfortable.  Discussed increasing Lasix for the next 1 to 2 days and have him follow up with cardiology.  Patient is agreeable to plan.   [CH]    Clinical Course User Index [CH] , Barbette Hair, MD    Patient presents with shortness of breath.  He is overall nontoxic-appearing and vital signs are notable for initial O2 sats in the low 90s.  He is in no respiratory distress.  History is most suggestive of likely some pulmonary edema.  Breath sounds are mostly clear but he does appear volume overloaded peripherally.  EKG shows no signs of ischemia.  Troponin is at the patient's baseline.  No significant metabolic derangements and lab work is otherwise reassuring.  Patient was given 1 dose of IV Lasix as above.  Plan for taking 1 additional dose of home Lasix tomorrow and following up closely for recheck later this week.  After history, exam, and medical workup I feel the patient has been appropriately medically screened and is safe for discharge home. Pertinent diagnoses were discussed with the patient. Patient was given return precautions.   Final Clinical Impressions(s) / ED Diagnoses   Final diagnoses:  Acute  combined systolic and diastolic congestive heart failure (HCC)  SOB (shortness of breath)    ED Discharge Orders    None       , Barbette Hair, MD 02/25/18 [phone]

## 2018-02-25 NOTE — ED Triage Notes (Signed)
Pt c/o sob that has gotten increasingly worse over the last couple of days; pt denies any chest pain and is c/o a dry cough

## 2018-02-25 NOTE — Discharge Instructions (Addendum)
You were seen today for shortness of breath.  This is likely related to your known heart failure.  You were given an additional dose of Lasix in the emergency room.  Take 1 extra 40 mg dose tomorrow.  Follow-up closely with your primary doctor for recheck.  If you develop new or worsening symptoms including chest pain or worsening shortness of breath you should be reevaluated.

## 2018-02-25 NOTE — ED Notes (Signed)
Date and time results received: 02/25/18 0554 (use smartphrase ".now" to insert current time)  Test: troponin Critical Value: 0.05  Name of Provider Notified: Dr Dina Rich  Orders Received? Or Actions Taken?: Actions Taken: no orders received.

## 2018-02-25 NOTE — ED Notes (Signed)
Pt's o2 stayed 95-96% while walking pt states he feels good enough to go home. EDP aware.

## 2018-03-02 ENCOUNTER — Emergency Department (HOSPITAL_COMMUNITY): Payer: Medicare HMO

## 2018-03-02 ENCOUNTER — Other Ambulatory Visit: Payer: Self-pay

## 2018-03-02 ENCOUNTER — Encounter (HOSPITAL_COMMUNITY): Payer: Self-pay

## 2018-03-02 ENCOUNTER — Observation Stay (HOSPITAL_COMMUNITY)
Admission: EM | Admit: 2018-03-02 | Discharge: 2018-03-03 | Disposition: A | Discharge status: Home / Self Care | Payer: Medicare HMO | Attending: Internal Medicine | Admitting: Internal Medicine

## 2018-03-02 DIAGNOSIS — Z79899 Other long term (current) drug therapy: Secondary | ICD-10-CM | POA: Diagnosis not present

## 2018-03-02 DIAGNOSIS — I441 Atrioventricular block, second degree: Secondary | ICD-10-CM | POA: Insufficient documentation

## 2018-03-02 DIAGNOSIS — Z87891 Personal history of nicotine dependence: Secondary | ICD-10-CM | POA: Insufficient documentation

## 2018-03-02 DIAGNOSIS — I5023 Acute on chronic systolic (congestive) heart failure: Secondary | ICD-10-CM | POA: Diagnosis not present

## 2018-03-02 DIAGNOSIS — I4892 Unspecified atrial flutter: Secondary | ICD-10-CM | POA: Diagnosis not present

## 2018-03-02 DIAGNOSIS — Z7901 Long term (current) use of anticoagulants: Secondary | ICD-10-CM

## 2018-03-02 DIAGNOSIS — R001 Bradycardia, unspecified: Secondary | ICD-10-CM | POA: Insufficient documentation

## 2018-03-02 DIAGNOSIS — E1159 Type 2 diabetes mellitus with other circulatory complications: Secondary | ICD-10-CM

## 2018-03-02 DIAGNOSIS — E782 Mixed hyperlipidemia: Secondary | ICD-10-CM | POA: Diagnosis not present

## 2018-03-02 DIAGNOSIS — Z6835 Body mass index (BMI) 35.0-35.9, adult: Secondary | ICD-10-CM | POA: Diagnosis not present

## 2018-03-02 DIAGNOSIS — I5043 Acute on chronic combined systolic (congestive) and diastolic (congestive) heart failure: Secondary | ICD-10-CM | POA: Insufficient documentation

## 2018-03-02 DIAGNOSIS — Z8249 Family history of ischemic heart disease and other diseases of the circulatory system: Secondary | ICD-10-CM | POA: Insufficient documentation

## 2018-03-02 DIAGNOSIS — I471 Supraventricular tachycardia: Secondary | ICD-10-CM | POA: Insufficient documentation

## 2018-03-02 DIAGNOSIS — I10 Essential (primary) hypertension: Secondary | ICD-10-CM | POA: Diagnosis present

## 2018-03-02 DIAGNOSIS — I5022 Chronic systolic (congestive) heart failure: Secondary | ICD-10-CM | POA: Diagnosis present

## 2018-03-02 DIAGNOSIS — R0602 Shortness of breath: Secondary | ICD-10-CM | POA: Diagnosis not present

## 2018-03-02 DIAGNOSIS — I110 Hypertensive heart disease with heart failure: Secondary | ICD-10-CM | POA: Diagnosis not present

## 2018-03-02 DIAGNOSIS — Z794 Long term (current) use of insulin: Secondary | ICD-10-CM | POA: Diagnosis not present

## 2018-03-02 DIAGNOSIS — R7989 Other specified abnormal findings of blood chemistry: Secondary | ICD-10-CM

## 2018-03-02 DIAGNOSIS — M1990 Unspecified osteoarthritis, unspecified site: Secondary | ICD-10-CM | POA: Diagnosis not present

## 2018-03-02 DIAGNOSIS — R918 Other nonspecific abnormal finding of lung field: Secondary | ICD-10-CM | POA: Insufficient documentation

## 2018-03-02 DIAGNOSIS — Z950 Presence of cardiac pacemaker: Secondary | ICD-10-CM | POA: Insufficient documentation

## 2018-03-02 DIAGNOSIS — R778 Other specified abnormalities of plasma proteins: Secondary | ICD-10-CM | POA: Diagnosis present

## 2018-03-02 HISTORY — DX: Morbid (severe) obesity due to excess calories: E66.01

## 2018-03-02 HISTORY — DX: Body mass index (bmi) 37.0-37.9, adult: Z68.37

## 2018-03-02 LAB — BASIC METABOLIC PANEL
Anion gap: 10 (ref 5–15)
BUN: 11 mg/dL (ref 8–23)
CO2: 27 mmol/L (ref 22–32)
Calcium: 9 mg/dL (ref 8.9–10.3)
Chloride: 105 mmol/L (ref 98–111)
Creatinine, Ser: 1.04 mg/dL (ref 0.61–1.24)
GFR calc Af Amer: >60 mL/min (ref >60)
GFR calc non Af Amer: >60 mL/min (ref >60)
Glucose, Bld: 148 mg/dL — ABNORMAL HIGH (ref 70–99)
Potassium: 3.9 mmol/L (ref 3.5–5.1)
Sodium: 142 mmol/L (ref 135–145)

## 2018-03-02 LAB — CBC WITH DIFFERENTIAL/PLATELET
Abs Immature Granulocytes: 0.04 10*3/uL (ref 0.00–0.07)
Basophils Absolute: 0.1 10*3/uL (ref 0.0–0.1)
Basophils Relative: 1 %
EOS PCT: 4 %
Eosinophils Absolute: 0.3 10*3/uL (ref 0.0–0.5)
HCT: 41.2 % (ref 39.0–52.0)
Hemoglobin: 12.4 g/dL — ABNORMAL LOW (ref 13.0–17.0)
Immature Granulocytes: 1 %
Lymphocytes Relative: 26 %
Lymphs Abs: 2.1 10*3/uL (ref 0.7–4.0)
MCH: 25.7 pg — ABNORMAL LOW (ref 26.0–34.0)
MCHC: 30.1 g/dL (ref 30.0–36.0)
MCV: 85.3 fL (ref 80.0–100.0)
MONO ABS: 0.8 10*3/uL (ref 0.1–1.0)
Monocytes Relative: 10 %
NEUTROS ABS: 4.7 10*3/uL (ref 1.7–7.7)
Neutrophils Relative %: 58 %
Platelets: 133 10*3/uL — ABNORMAL LOW (ref 150–400)
RBC: 4.83 MIL/uL (ref 4.22–5.81)
RDW: 17 % — ABNORMAL HIGH (ref 11.5–15.5)
WBC: 8.1 10*3/uL (ref 4.0–10.5)
nRBC: 0 % (ref 0.0–0.2)

## 2018-03-02 LAB — CBG MONITORING, ED
Glucose-Capillary: 89 mg/dL (ref 70–99)
Glucose-Capillary: 126 mg/dL — ABNORMAL HIGH (ref 70–99)

## 2018-03-02 LAB — GLUCOSE, CAPILLARY: Glucose-Capillary: 104 mg/dL — ABNORMAL HIGH (ref 70–99)

## 2018-03-02 LAB — BRAIN NATRIURETIC PEPTIDE: B Natriuretic Peptide: 917.2 pg/mL — ABNORMAL HIGH (ref 0.0–100.0)

## 2018-03-02 MED ORDER — CARVEDILOL 25 MG PO TABS
25.0000 mg | ORAL_TABLET | Freq: Two times a day (BID) | ORAL | Status: DC
  Administered 2018-03-02 – 2018-03-03 (×3): 25 mg via ORAL
  Filled 2018-03-02 (×2): qty 1
  Filled 2018-03-02: qty 2

## 2018-03-02 MED ORDER — TAMSULOSIN HCL 0.4 MG PO CAPS
0.4000 mg | ORAL_CAPSULE | Freq: Every day | ORAL | Status: DC
  Administered 2018-03-02: 0.4 mg via ORAL
  Filled 2018-03-02: qty 1

## 2018-03-02 MED ORDER — ACETAMINOPHEN 325 MG PO TABS: 650.0000 mg | ORAL_TABLET | ORAL | Status: DC | PRN

## 2018-03-02 MED ORDER — FUROSEMIDE 10 MG/ML IJ SOLN
40.0000 mg | Freq: Once | INTRAMUSCULAR | Status: AC
  Administered 2018-03-02: 40 mg via INTRAVENOUS
  Filled 2018-03-02: qty 4

## 2018-03-02 MED ORDER — INSULIN ASPART 100 UNIT/ML ~~LOC~~ SOLN
0.0000 [IU] | Freq: Three times a day (TID) | SUBCUTANEOUS | Status: DC
  Administered 2018-03-02: 3 [IU] via SUBCUTANEOUS

## 2018-03-02 MED ORDER — SODIUM CHLORIDE 0.9% FLUSH
3.0000 mL | Freq: Two times a day (BID) | INTRAVENOUS | Status: DC
  Administered 2018-03-02 – 2018-03-03 (×2): 3 mL via INTRAVENOUS

## 2018-03-02 MED ORDER — ONDANSETRON HCL 4 MG/2ML IJ SOLN: 4.0000 mg | Freq: Four times a day (QID) | INTRAMUSCULAR | Status: DC | PRN

## 2018-03-02 MED ORDER — METFORMIN HCL 500 MG PO TABS
1000.0000 mg | ORAL_TABLET | Freq: Two times a day (BID) | ORAL | Status: DC
  Administered 2018-03-02 – 2018-03-03 (×3): 1000 mg via ORAL
  Filled 2018-03-02 (×3): qty 2

## 2018-03-02 MED ORDER — BENAZEPRIL HCL 40 MG PO TABS
40.0000 mg | ORAL_TABLET | Freq: Every day | ORAL | Status: DC
  Administered 2018-03-03: 40 mg via ORAL
  Filled 2018-03-02 (×2): qty 1

## 2018-03-02 MED ORDER — MAGNESIUM OXIDE 400 (241.3 MG) MG PO TABS
400.0000 mg | ORAL_TABLET | Freq: Every day | ORAL | Status: DC
  Administered 2018-03-02 – 2018-03-03 (×2): 400 mg via ORAL
  Filled 2018-03-02 (×2): qty 1

## 2018-03-02 MED ORDER — ADULT MULTIVITAMIN W/MINERALS CH
1.0000 | ORAL_TABLET | Freq: Every day | ORAL | Status: DC
  Administered 2018-03-02 – 2018-03-03 (×2): 1 via ORAL
  Filled 2018-03-02 (×2): qty 1

## 2018-03-02 MED ORDER — ROSUVASTATIN CALCIUM 20 MG PO TABS
20.0000 mg | ORAL_TABLET | Freq: Every day | ORAL | Status: DC
  Administered 2018-03-02 – 2018-03-03 (×2): 20 mg via ORAL
  Filled 2018-03-02 (×2): qty 1

## 2018-03-02 MED ORDER — FUROSEMIDE 10 MG/ML IJ SOLN
80.0000 mg | Freq: Two times a day (BID) | INTRAMUSCULAR | Status: DC
  Administered 2018-03-02 – 2018-03-03 (×2): 80 mg via INTRAVENOUS
  Filled 2018-03-02 (×2): qty 8

## 2018-03-02 MED ORDER — POTASSIUM CHLORIDE CRYS ER 20 MEQ PO TBCR
20.0000 meq | EXTENDED_RELEASE_TABLET | Freq: Two times a day (BID) | ORAL | Status: DC
  Administered 2018-03-02 – 2018-03-03 (×3): 20 meq via ORAL
  Filled 2018-03-02 (×3): qty 1

## 2018-03-02 MED ORDER — FUROSEMIDE 10 MG/ML IJ SOLN: 80.0000 mg | Freq: Two times a day (BID) | INTRAMUSCULAR | Status: DC

## 2018-03-02 MED ORDER — ZOLPIDEM TARTRATE 5 MG PO TABS: 5.0000 mg | ORAL_TABLET | Freq: Every evening | ORAL | Status: DC | PRN

## 2018-03-02 MED ORDER — APIXABAN 5 MG PO TABS
5.0000 mg | ORAL_TABLET | Freq: Two times a day (BID) | ORAL | Status: DC
  Administered 2018-03-02 – 2018-03-03 (×2): 5 mg via ORAL
  Filled 2018-03-02 (×4): qty 1

## 2018-03-02 MED ORDER — INSULIN GLARGINE 100 UNIT/ML ~~LOC~~ SOLN
70.0000 [IU] | Freq: Every day | SUBCUTANEOUS | Status: DC
  Administered 2018-03-02: 70 [IU] via SUBCUTANEOUS
  Filled 2018-03-02 (×2): qty 0.7

## 2018-03-02 MED ORDER — AMLODIPINE BESYLATE 10 MG PO TABS
10.0000 mg | ORAL_TABLET | Freq: Every day | ORAL | Status: DC
  Administered 2018-03-02 – 2018-03-03 (×2): 10 mg via ORAL
  Filled 2018-03-02: qty 2
  Filled 2018-03-02: qty 1

## 2018-03-02 MED ORDER — SODIUM CHLORIDE 0.9% FLUSH: 3.0000 mL | INTRAVENOUS | Status: DC | PRN

## 2018-03-02 MED ORDER — RESVERATROL 100 MG PO CAPS: 100.0000 mg | ORAL_CAPSULE | Freq: Every day | ORAL | Status: DC

## 2018-03-02 MED ORDER — SODIUM CHLORIDE 0.9 % IV SOLN: 250.0000 mL | INTRAVENOUS | Status: DC | PRN

## 2018-03-02 NOTE — ED Triage Notes (Signed)
CBG 89

## 2018-03-02 NOTE — H&P (Addendum)
History and Physical    SAEL FURCHES ULA:[id number] DOB: 15-May-1945 DOA: 03/02/2018  PCP: Mikey Kirschner, MD  Patient coming from: Home  I have personally briefly reviewed patient's old medical records in [place]  Chief Complaint: Worsening shortness of breath over the past week  HPI: John [name] is a 72 y.o. male with medical history significant of congestive heart failure with an ejection fraction of 45% noted on 02/06/2018, atrial flutter, SVT, status post permanent pacemaker placement, diabetes type 2 who presents the emergency department complaining of shortness of breath.  He was seen and [place] at the [facility] emergency department 5 days ago with complaints of increased shortness of breath.  He increased his Lasix dose and has been weighing himself daily.  His baseline weight is about 250 pounds on his scale at home and it has increased to 260.  His Lasix dose was increased however patient continued to feel short of breath despite being compliant with medication regimen.  His chest x-ray was obtained today and shows worsening edema with some right upper lobe patchy consolidation.  Does not use any home oxygen and on arrival here were 96% on room air.  He was placed on 2 L of oxygen for comfort.  Patient complains of increased edema in his feet and his stomach but no hip edema.  He has had a new 2-3 pillow orthopnea and has woken up at night short of breath with paroxysmal nocturnal dyspnea.  He states he drinks approximately a quart of water a day but does not truly measure it.  He gets concerned when he is out working in the garden that he may need to drink more water.  Echocardiogram from November 2019 was reviewed and shows an ejection fraction of 45% with apical hypokinesis and mild aortic stenosis.  Patient was referred to me for further evaluation and management. Patient reports that his glucoses have been better recently since he is switched doctors.  His sugars  have been acceptable at home.  His recent hemoglobin A1c on February 10, 2018 was 7.4.   Review of Systems: As per HPI otherwise all other systems reviewed and  negative.   Past Medical History:  Diagnosis Date  . Atrial flutter ([place]) 12/2010   Admitted with symptomatic bradycardia (HR 40s), atrial flutter with slow ventricular response 12/2010 + volume overload; AV nodal agents d/c'd and Pradaxa started; RFA in 01/2011  . CHF (congestive heart failure) ([place])   . Chronic combined systolic and diastolic heart failure (HCC)    echo 01/06/11: mild LVH, EF 65-70%, mod to severe LAE, mild RVE, mild RAE, PASP 32;   TEE 10/12: EF 45-50%   . Class 2 severe obesity due to excess calories with serious comorbidity and body mass index (BMI) of 37.0 to 37.9 in adult ([place]) 07/17/2017  . Diabetes mellitus    non insulin dependant  . Hyperlipidemia   . Hypertension   . Osteoarthritis   . Presence of permanent cardiac pacemaker   . PSVT (paroxysmal supraventricular tachycardia) (HCC)    Possibly atrial flutter    Past Surgical History:  Procedure Laterality Date  . ATRIAL FLUTTER ABLATION N/A 02/07/2011   Procedure: ATRIAL FLUTTER ABLATION;  Surgeon: Evans Lance, MD;  Location: [facility] CATH LAB;  Service: Cardiovascular;  Laterality: N/A;  . CARDIAC ELECTROPHYSIOLOGY STUDY AND ABLATION  02/07/11  . CARDIOVERSION N/A 03/23/2016   Procedure: CARDIOVERSION;  Surgeon: Evans Lance, MD;  Location: [place];  Service: Cardiovascular;  Laterality: N/A;  . COLONOSCOPY N/A 04/17/2017   Procedure: COLONOSCOPY;  Surgeon: Rogene Houston, MD;  Location[facility] ENDO [facility];  Service: Endoscopy;  Laterality: N/A;  830  . EP IMPLANTABLE DEVICE N/A 08/29/2015   Procedure: Pacemaker Implant;  Surgeon: Evans Lance, MD;  Location: [place] CV LAB;  Service: Cardiovascular;  Laterality: N/A;  . EP IMPLANTABLE DEVICE N/A 12/07/2015   Procedure: PPM Lead Revision/Repair;  Surgeon: Evans Lance, MD;  Location: [place] CV LAB;  Service: Cardiovascular;  Laterality: N/A;  . KNEE ARTHROSCOPY  2005   left  . LUMBAR SPINE SURGERY     "I've had 6 ORs 1972 thru 2004"  . POLYPECTOMY  04/17/2017   Procedure: POLYPECTOMY;  Surgeon: Rogene Houston, MD;  Location: [facility];  Service: Endoscopy;;  transverse colon x3;    Social History   Social History Narrative  . Not on file   Patient is married and presents with his wife.  reports that he quit smoking about 31 years ago. His smoking use included cigarettes. He has a 10.00 pack-year smoking history. He has never used smokeless tobacco. He reports that he drinks alcohol. He reports that he does not use drugs.  No Known Allergies  Family History  Problem Relation Age of Onset  . Heart attack Mother   . Hypertension Mother   . Heart attack Father   . Hypertension Father   . Heart attack Brother   . Colon cancer Neg Hx     Prior to Admission medications   Medication Sig Start Date End Date Taking? Authorizing Provider  amLODipine (NORVASC) 10 MG tablet One daily Patient taking differently: Take 10 mg by mouth daily. One daily 12/25/17  Yes Mikey Kirschner, MD  apixaban (ELIQUIS) 5 MG TABS tablet TAKE 1 TABLET BY MOUTH 2 (TWO) TIMES DAILY. Patient taking differently: Take 5 mg by mouth 2 (two) times daily.  06/10/17  Yes Herminio Commons, MD  benazepril (LOTENSIN) 40 MG tablet Take 1 tablet (40 mg total) by mouth daily. 12/25/17  Yes Mikey Kirschner, MD  carvedilol (COREG) 25 MG tablet TAKE 1 TABLET (25 MG TOTAL) BY MOUTH 2 (TWO) TIMES DAILY WITH A MEAL. 08/13/17  Yes Herminio Commons, MD  furosemide (LASIX) 40 MG tablet TAKE 1 TABLET TWICE DAILY Patient taking differently: Take 40 mg by mouth 2 (two) times daily.  02/11/18  Yes Herminio Commons, MD  glucose blood test strip 1 each by Other route as needed. Use as instructed bid 07/23/17  Yes Nida, Marella Chimes, MD  Magnesium 400 MG CAPS Take 400 mg by mouth daily.    Yes  [provider]  metFORMIN (GLUCOPHAGE) 1000 MG tablet TAKE 1 TABLET TWICE DAILY WITH MEALS Patient taking differently: Take 1,000 mg by mouth 2 (two) times daily with a meal.  01/09/18  Yes Nida, Marella Chimes, MD  Multiple Vitamins-Minerals (MULTIVITAMIN WITH MINERALS) tablet Take 1 tablet by mouth daily.   Yes [provider]  Resveratrol 100 MG CAPS Take 100 mg by mouth daily.    Yes [provider]  rosuvastatin (CRESTOR) 20 MG tablet Take 1 tablet (20 mg total) by mouth daily. 12/25/17  Yes Mikey Kirschner, MD  tamsulosin (FLOMAX) 0.4 MG CAPS capsule Take 1 capsule (0.4 mg total) by mouth at bedtime. 12/25/17  Yes Mikey Kirschner, MD  TRESIBA FLEXTOUCH 200 UNIT/ML SOPN INJECT 70 UNITS INTO THE SKIN AT BEDTIME Patient taking differently: Inject 70  Units as directed daily.  01/21/18  Yes Nida, Marella Chimes, MD  TRULICITY 1.5 UU/7.2ZD SOPN INJECT  1.5MG (CONTENTS OF 1 PEN)  INTO  THE  SKIN ONE TIME WEEKLY Patient taking differently: Inject 1.5 mg as directed every Wednesday.  01/15/18  Yes Cassandria Anger, MD  hydrALAZINE (APRESOLINE) 25 MG tablet Take 1 tablet (25 mg total) by mouth 3 (three) times daily. 03/22/11 06/19/11  Yehuda Savannah, MD    Physical Exam:  Constitutional: NAD, calm, comfortable, lying in semi-Fowlers position Vitals:   03/02/18 0515 03/02/18 0530 03/02/18 0545 03/02/18 0733  BP: 116/71 116/69 114/67   Pulse: (!) 59 60 64   Resp: 13 11 19    Temp:      TempSrc:      SpO2: 97% 99% 90%   Weight:    118.1 kg   Eyes: PERRL, lids and conjunctivae normal ENMT: Mucous membranes are moist. Posterior pharynx clear of any exudate or lesions.Normal dentition.  Neck: normal, supple, no masses, no thyromegaly Respiratory: Very minimal Rales at the bases, no wheezing, no crackles. Normal respiratory effort. No accessory muscle use.  Cardiovascular: Regular rate and rhythm, no murmurs / rubs / gallops.  2+ pretibial extremity edema  laterally. 2+ pedal pulses. No carotid bruits.  No JVP no JVD noted Abdomen: no tenderness, no masses palpated. No hepatosplenomegaly. Bowel sounds positive.  Slight edema and increased girth noted Musculoskeletal: no clubbing / cyanosis. No joint deformity upper and lower extremities. Good ROM, no contractures. Normal muscle tone.  Skin: no rashes, lesions, ulcers. No induration Neurologic: CN 2-12 grossly intact. Sensation intact, DTR normal. Strength 5/5 in all 4.  Psychiatric: Normal judgment and insight. Alert and oriented x 3. Normal mood.   Labs on Admission: I have personally reviewed following labs and imaging studies  CBC: Recent Labs  Lab 02/25/18 0524 03/02/18 0426  WBC 9.0 8.1  NEUTROABS 6.2 4.7  HGB 11.5* 12.4*  HCT 36.7* 41.2  MCV 84.6 85.3  PLT 102* 664*   Basic Metabolic Panel: Recent Labs  Lab 02/25/18 0524 03/02/18 0426  NA 142 142  K 3.5 3.9  CL 112* 105  CO2 24 27  GLUCOSE 67* 148*  BUN 15 11  CREATININE 0.90 1.04  CALCIUM 8.6* 9.0   GFR: Estimated Creatinine Clearance: 84.5 mL/min (by C-G formula based on SCr of 1.04 mg/dL).   Recent Labs  Lab 02/25/18 0524  TROPONINI 0.05*   Urine analysis:    Component Value Date/Time   COLORURINE YELLOW 01/05/2014 [place] 01/05/2014 0744   LABSPEC >1.030 (H) 01/05/2014 0744   PHURINE 5.5 01/05/2014 0744   GLUCOSEU 100 (A) 01/05/2014 0744   HGBUR SMALL (A) 01/05/2014 0744   BILIRUBINUR SMALL (A) 01/05/2014 0744   KETONESUR TRACE (A) 01/05/2014 0744   PROTEINUR 100 (A) 01/05/2014 0744   UROBILINOGEN 0.2 01/05/2014 0744   NITRITE NEGATIVE 01/05/2014 0744   LEUKOCYTESUR NEGATIVE 01/05/2014 0744    Radiological Exams on Admission: Dg Chest 2 View  Result Date: 03/02/2018 CLINICAL DATA:  Initial evaluation for acute shortness of breath. EXAM: CHEST - 2 VIEW COMPARISON:  Prior radiograph from 02/25/2018 FINDINGS: Left-sided pacemaker/AICD in place. Cardiomegaly, stable. Mediastinal  silhouette normal. Aortic atherosclerosis. Lungs normally inflated. Diffuse vascular congestion with interstitial prominence, compatible with diffuse pulmonary edema. No pleural effusion. Slightly more confluent patchy opacity within the right upper lobe, which could reflect superimposed infiltrate and/or atelectatic changes. No other consolidative airspace opacity. No pneumothorax. Osseous structures within normal  limits. IMPRESSION: 1. Cardiomegaly with moderate diffuse pulmonary interstitial edema. 2. Slightly more confluent patchy opacity within the right upper lobe, which could reflect superimposed infiltrate and/or atelectatic changes. Electronically Signed   By: Jeannine Boga M.D.   On: 03/02/2018 05:38    EKG: Independently reviewed.  Ventricular paced rhythm at 75 bpm  Assessment/Plan Principal Problem:   Acute on chronic systolic CHF (congestive heart failure), NYHA class 3 (HCC) Active Problems:   Elevated troponin   DM type 2 causing vascular disease ([place])   Pacemaker   Mixed hyperlipidemia   HYPERTENSION   Current use of long term anticoagulation   1.  Acute on chronic systolic congestive heart failure New York Heart Association class III: Patient usually has class II symptoms however he has had a recent decompensation to class III symptoms.  Patient with recent two-pillow orthopnea, paroxysmal nocturnal dyspnea and increasing edema of the lower extremities.  Chest x-ray shows worsening edema with a possible consolidation but no complaints of cough, sputum production, elevated white blood cell count, or fever.  Presentation consistent with acute exacerbation of congestive heart failure.  Patient will be placed in overnight observation.  Will give him Lasix 80 mg IV twice daily and follow weights.  Patient's lung examination is relatively unremarkable.  I believe that he will be stable for discharge in the a.m. after several doses of IV Lasix.  2.  Elevated troponin: This is from  3 December.  With no complaints of chest pain at this time.  This is likely related to strain as patient has no complaints of chest pain and no abnormalities on EKG (this may not be as significant as the patient does have a pacemaker).  Does have some known hypokinesis in the apical area from previous echocardiographic studies.  If patient develops chest pain or change in rhythm or symptomatology consistent with react ischemia will consider going troponins at that point.  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  At the time he had a ejection fraction of 30% which has now improved.  3.  Type 2 diabetes with vascular disease: Is anticoagulated but not noted to be on an antiplatelet agent.  He does have a history of coronary disease.  I am going to start aspirin 81 mg daily.  Diabetes has been in better control over the past 18 months.  We will continue current management.  We will obtain fingerstick blood glucoses before meals and at bedtime and cover with sliding scale insulin as needed.  4.  Permanent pacemaker: This is noted.  Recently interrogated and functioning properly.  5.  Mixed hyperlipidemia: Continue home medication management with Crestor.  6.  Hypertension: Continue medications as per patient's medication list to include lisinopril, carvedilol, amlodipine.  7.  Long-term use of anticoagulation: Patient is on Eliquis he takes it twice daily.  This is likely for his history of atrial flutter.  Will continue same.  DVT prophylaxis: Eliquis Code Status: Full code Family Communication: Spoke with patient and his wife who was present at the time of admission. Disposition Plan: Likely home in 24 hours Consults called: None Admission status: Observation   Lady Deutscher MD FACP [facility] Pager [phone]  If 7PM-7AM, please contact  night-coverage [email or web address] Password [email or web address]  03/02/2018, 7:45 AM

## 2018-03-02 NOTE — ED Provider Notes (Addendum)
[place] EMERGENCY DEPARTMENT Provider Note   CSN: [id number] Arrival date & time: 03/02/18  0093   History   Chief Complaint Chief Complaint  Patient presents with  . Shortness of Breath    HPI John [name] is a 72 y.o. male with a PMH of CHF, permanent pacemaker, and diabetes presenting with constant shortness of breath onset 2 weeks ago. Patient reports symptoms are worse with laying down and walking. Patient states symptoms are better with sitting up. Patient states he uses 3 pillows to sleep at night. Patient was evaluated at [facility] on 12/3, given lasix, and instructed to follow up with cardiology. Patient states he has an appointment on this Wednesday with cardiology. Patient reports he has increased his lasix 74m from BID to TID with partial relief. Patient reports an intermittent dry cough, bilateral leg edema, and abdominal distention. Patient reports extremity edema has improved, but shortness of breath has not improved. Patient denies chest pain, palpitations, abdominal pain, nausea, or vomiting. Patient denies problems urinating and states he urinates multiple times per day. Patient denies using oxygen at home. Patient denies any medication changes.    HPI  Past Medical History:  Diagnosis Date  . Atrial flutter ([place] 12/2010   Admitted with symptomatic bradycardia (HR 40s), atrial flutter with slow ventricular response 12/2010 + volume overload; AV nodal agents d/c'd and Pradaxa started; RFA in 01/2011  . CHF (congestive heart failure) ([facility]   . Chronic combined systolic and diastolic heart failure (HCC)    echo 01/06/11: mild LVH, EF 65-70%, mod to severe LAE, mild RVE, mild RAE, PASP 32;   TEE 10/12: EF 45-50%   . Diabetes mellitus    non insulin dependant  . Hyperlipidemia   . Hypertension   . Osteoarthritis   . Presence of permanent cardiac pacemaker   . PSVT (paroxysmal supraventricular tachycardia) (HCC)    Possibly atrial flutter     Patient Active Problem List   Diagnosis Date Noted  . CHF exacerbation ([place] 03/02/2018  . Class 2 severe obesity due to excess calories with serious comorbidity and body mass index (BMI) of 37.0 to 37.9 in adult ([place] 07/17/2017  . History of colonic polyps 12/28/2016  . Atrial pacemaker lead displacement 12/07/2015  . Acute CHF (congestive heart failure) ([place] 11/21/2015  . CHF, acute on chronic ([place] 11/20/2015  . Complete heart block ([place] 11/20/2015  . Chronic diastolic CHF (congestive heart failure) ([place] 08/28/2015  . Hypertensive heart disease 08/28/2015  . NSVT (nonsustained ventricular tachycardia) ([place] 08/28/2015  . Elevated troponin 08/28/2015  . Second degree heart block 08/25/2015  . Bradycardia 08/25/2015  . Acute diastolic CHF (congestive heart failure) ([place] 08/25/2015  . Chronic anticoagulation-discontinued 05/04/2011  . Atrial flutter ([place] 01/05/2011  . DM type 2 causing vascular disease ([place] 12/23/2009  . Mixed hyperlipidemia 12/23/2009  . HYPERTENSION 12/23/2009  . DEGENERATIVE JOINT DISEASE 12/23/2009    Past Surgical History:  Procedure Laterality Date  . ATRIAL FLUTTER ABLATION N/A 02/07/2011   Procedure: ATRIAL FLUTTER ABLATION;  Surgeon: GEvans Lance MD;  [facility];  Service: Cardiovascular;  Laterality: N/A;  . CARDIAC ELECTROPHYSIOLOGY STUDY AND ABLATION  02/07/11  . CARDIOVERSION N/A 03/23/2016   Procedure: CARDIOVERSION;  Surgeon: GEvans Lance MD;  [place]  Service: Cardiovascular;  Laterality: N/A;  . COLONOSCOPY N/A 04/17/2017   Procedure: COLONOSCOPY;  Surgeon: RRogene Houston MD;  [facility];  Service: Endoscopy;  Laterality: N/A;  830  .  EP IMPLANTABLE DEVICE N/A 08/29/2015   Procedure: Pacemaker Implant;  Surgeon: Evans Lance, MD;  Location: [place] CV LAB;  Service: Cardiovascular;  Laterality: N/A;  . EP IMPLANTABLE DEVICE N/A 12/07/2015   Procedure: PPM Lead Revision/Repair;   Surgeon: Evans Lance, MD;  Location: [place] CV LAB;  Service: Cardiovascular;  Laterality: N/A;  . KNEE ARTHROSCOPY  2005   left  . LUMBAR SPINE SURGERY     "I've had 6 ORs 1972 thru 2004"  . POLYPECTOMY  04/17/2017   Procedure: POLYPECTOMY;  Surgeon: Rogene Houston, MD;  Location: [facility];  Service: Endoscopy;;  transverse colon x3;        Home Medications    Prior to Admission medications   Medication Sig Start Date End Date Taking? Authorizing Provider  amLODipine (NORVASC) 10 MG tablet One daily 12/25/17   Mikey Kirschner, MD  apixaban (ELIQUIS) 5 MG TABS tablet TAKE 1 TABLET BY MOUTH 2 (TWO) TIMES DAILY. 06/10/17   Herminio Commons, MD  benazepril (LOTENSIN) 40 MG tablet Take 1 tablet (40 mg total) by mouth daily. 12/25/17   Mikey Kirschner, MD  carvedilol (COREG) 25 MG tablet TAKE 1 TABLET (25 MG TOTAL) BY MOUTH 2 (TWO) TIMES DAILY WITH A MEAL. 08/13/17   Herminio Commons, MD  furosemide (LASIX) 40 MG tablet TAKE 1 TABLET TWICE DAILY 02/11/18   Herminio Commons, MD  glucose blood test strip 1 each by Other route as needed. Use as instructed bid 07/23/17   Cassandria Anger, MD  Magnesium 400 MG CAPS Take 400 mg by mouth daily.     [provider]  metFORMIN (GLUCOPHAGE) 1000 MG tablet TAKE 1 TABLET TWICE DAILY WITH MEALS 01/09/18   Nida, Marella Chimes, MD  Multiple Vitamins-Minerals (MULTIVITAMIN WITH MINERALS) tablet Take 1 tablet by mouth daily.    [provider]  Resveratrol 100 MG CAPS Take 100 mg by mouth daily.     [provider]  rosuvastatin (CRESTOR) 20 MG tablet Take 1 tablet (20 mg total) by mouth daily. 12/25/17   Mikey Kirschner, MD  tamsulosin (FLOMAX) 0.4 MG CAPS capsule Take 1 capsule (0.4 mg total) by mouth at bedtime. 12/25/17   Mikey Kirschner, MD  TRESIBA FLEXTOUCH 200 UNIT/ML SOPN INJECT 70 UNITS INTO THE SKIN AT BEDTIME 01/21/18   Cassandria Anger, MD  TRULICITY 1.5 NT/7.0YF SOPN INJECT  1.5MG  (CONTENTS OF 1 PEN)  INTO  THE  SKIN ONE TIME WEEKLY 01/15/18   Cassandria Anger, MD  hydrALAZINE (APRESOLINE) 25 MG tablet Take 1 tablet (25 mg total) by mouth 3 (three) times daily. 03/22/11 06/19/11  Yehuda Savannah, MD    Family History Family History  Problem Relation Age of Onset  . Heart attack Mother   . Hypertension Mother   . Heart attack Father   . Hypertension Father   . Heart attack Brother   . Colon cancer Neg Hx     Social History Social History   Tobacco Use  . Smoking status: Former Smoker    Packs/day: 1.00    Years: 10.00    Pack years: 10.00    Types: Cigarettes    Last attempt to quit: 03/26/1986    Years since quitting: 31.9  . Smokeless tobacco: Never Used  . Tobacco comment: "stopped cigarette  smoking 1988"  Substance Use Topics  . Alcohol use: Yes    Alcohol/week: 0.0 standard drinks    Comment: "  quit alcohol ~ 2007"  . Drug use: No     Allergies   Patient has no known allergies.   Review of Systems Review of Systems  Constitutional: Negative for activity change, appetite change, chills, diaphoresis, fatigue, fever and unexpected weight change.  HENT: Negative for congestion and rhinorrhea.   Respiratory: Positive for cough and shortness of breath. Negative for chest tightness and wheezing.   Cardiovascular: Positive for leg swelling. Negative for chest pain and palpitations.  Gastrointestinal: Negative for abdominal pain, nausea and vomiting.  Endocrine: Negative for cold intolerance and heat intolerance.  Genitourinary: Negative for difficulty urinating.  Musculoskeletal: Negative for back pain.  Skin: Negative for rash.  Allergic/Immunologic: Negative for immunocompromised state.  Neurological: Negative for dizziness, syncope, weakness and light-headedness.  Psychiatric/Behavioral: Negative for agitation and behavioral problems. The patient is not nervous/anxious.      Physical Exam Updated Vital Signs BP 114/67   Pulse 64    Temp 98.2 F (36.8 C) (Oral)   Resp 19   SpO2 90%   Physical Exam  Constitutional: He is oriented to person, place, and time. He appears well-developed and well-nourished. No distress.  HENT:  Head: Normocephalic and atraumatic.  Cardiovascular: Normal rate, regular rhythm and normal heart sounds. Exam reveals no gallop and no friction rub.  No murmur heard. Pulmonary/Chest: Effort normal. No respiratory distress. He has decreased breath sounds. He has no wheezes. He has no rales.  Abdominal: Soft. He exhibits distension. There is no tenderness. There is no guarding.  Musculoskeletal: Normal range of motion.       Right lower leg: He exhibits edema (2+ pitting edema noted on exam.). He exhibits no tenderness.       Left lower leg: He exhibits edema (2+ pitting edema noted on exam.). He exhibits no tenderness.  Neurological: He is alert and oriented to person, place, and time.  Skin: Skin is warm. No rash noted. He is not diaphoretic. No erythema.  Psychiatric: He has a normal mood and affect.  Nursing note and vitals reviewed.    ED Treatments / Results  Labs (all labs ordered are listed, but only abnormal results are displayed) Labs Reviewed  BRAIN NATRIURETIC PEPTIDE - Abnormal; Notable for the following components:      Result Value   B Natriuretic Peptide 917.2 (*)    All other components within normal limits  BASIC METABOLIC PANEL - Abnormal; Notable for the following components:   Glucose, Bld 148 (*)    All other components within normal limits  CBC WITH DIFFERENTIAL/PLATELET - Abnormal; Notable for the following components:   Hemoglobin 12.4 (*)    MCH 25.7 (*)    RDW 17.0 (*)    Platelets 133 (*)    All other components within normal limits    EKG EKG Interpretation  Date/Time:  Sunday March 02 2018 04:03:09 EST Ventricular Rate:  75 PR Interval:    QRS Duration: 165 QT Interval:  466 QTC Calculation: 521 R Axis:   -97 Text Interpretation:   VENTRICULAR PACED RHYTHM No significant change since last tracing Confirmed by Thayer Jew [phone]) on 03/02/2018 4:07:36 AM   Radiology Dg Chest 2 View  Result Date: 03/02/2018 CLINICAL DATA:  Initial evaluation for acute shortness of breath. EXAM: CHEST - 2 VIEW COMPARISON:  Prior radiograph from 02/25/2018 FINDINGS: Left-sided pacemaker/AICD in place. Cardiomegaly, stable. Mediastinal silhouette normal. Aortic atherosclerosis. Lungs normally inflated. Diffuse vascular congestion with interstitial prominence, compatible with diffuse pulmonary edema. No pleural effusion. Slightly more  confluent patchy opacity within the right upper lobe, which could reflect superimposed infiltrate and/or atelectatic changes. No other consolidative airspace opacity. No pneumothorax. Osseous structures within normal limits. IMPRESSION: 1. Cardiomegaly with moderate diffuse pulmonary interstitial edema. 2. Slightly more confluent patchy opacity within the right upper lobe, which could reflect superimposed infiltrate and/or atelectatic changes. Electronically Signed   By: Jeannine Boga M.D.   On: 03/02/2018 05:38    Procedures Procedures (including critical care time)  Medications Ordered in ED Medications  furosemide (LASIX) injection 40 mg (has no administration in time range)     Initial Impression / Assessment and Plan / ED Course  I have reviewed the triage vital signs and the nursing notes.  Pertinent labs & imaging results that were available during my care of the patient were reviewed by me and considered in my medical decision making (see chart for details).  Clinical Course as of Mar 02 652  Nancy Fetter Mar 02, 2018  0521 Hemoglobin low at 12.4. It has improved from 11.5 to 12.4.   Hemoglobin(!): 12.4 [AH]  0530 BMP is within normal limits except hyperglycemia noted at 148.    [AH]  0600 CXR reveals moderate diffuse pulmonary interstitial edema. Patchy opacity within the right upper lobe noted  which may suggest infiltrate and/or atelectasis.  changes.    DG Chest 2 View [AH]    Clinical Course User Index [AH] Julienne Kass    Hemoglobin  Date Value Ref Range Status  03/02/2018 12.4 (L) 13.0 - 17.0 g/dL Final  02/25/2018 11.5 (L) 13.0 - 17.0 g/dL Final  03/23/2016 13.9 13.0 - 17.0 g/dL Final  03/07/2016 13.7 13.2 - 17.1 g/dL Final   Suspect symptoms are likely due to worsening pulmonary edema due to CHF. Ordered CXR and it reveals worsening diffuse pulmonary interstitial edema. Patient's shortness of breath has not improved by increasing lasix as outpatient, therefore, patient will benefit from inpatient admission. Patient also has bilateral lower extremity edema and abdominal distention due to fluid retention. BNP is elevated at 917. Patient is currently stable. Hospitalist was consulted and has agreed to admit patient.   Findings and plan of care discussed with supervising physician Dr. Dina Rich who personally evaluated and examined this patient.  Final Clinical Impressions(s) / ED Diagnoses   Final diagnoses:  Shortness of breath    ED Discharge Orders    None         Arville Lime, [place] 03/02/18 3568    Merryl Hacker, MD 03/03/18 [phone]

## 2018-03-02 NOTE — Progress Notes (Signed)
PHARMACIST - PHYSICIAN ORDER COMMUNICATION  CONCERNING: P&T Medication Policy on Herbal Medications  DESCRIPTION:  This patient's order for:   Resveratrol has been noted.  This product(s) is classified as an "herbal" or natural product. Due to a lack of definitive safety studies or FDA approval, nonstandard manufacturing practices, plus the potential risk of unknown drug-drug interactions while on inpatient medications, the Pharmacy and Therapeutics Committee does not permit the use of "herbal" or natural products of this type within [facility].   ACTION TAKEN: The pharmacy department is unable to verify this order at this time and your patient has been informed of this safety policy. Please reevaluate patient's clinical condition at discharge and address if the herbal or natural product(s) should be resumed at that time.

## 2018-03-02 NOTE — ED Triage Notes (Signed)
Pt c/o sob has gotten worse for the last two weeks, pt was seen at [facility] and pt still feels he is still sob at this time.

## 2018-03-02 NOTE — Discharge Instructions (Signed)

## 2018-03-02 NOTE — Plan of Care (Signed)
Discussed with Ana the PA Mr. Noreen is a 72 year old male with past medical history of CHF, atrial flutter/PSVT, s/p PM, DM type II; who presents with 2 weeks of shortness of breath.  Seen in the emergency department at [facility] 5 days ago.  BNP at that time 1009 and found to have mild pulmonary edema on chest x-ray.  Patient was advised to increase Lasix dose.  However, repeat chest x-ray today shows worsening edema and right upper lobe patchy consolidation despite patient followed recommendations.  Patient able to maintain O2 saturations on room air.  TR[facility] called to admit for heart failure exacerbation.

## 2018-03-03 DIAGNOSIS — M1990 Unspecified osteoarthritis, unspecified site: Secondary | ICD-10-CM | POA: Diagnosis not present

## 2018-03-03 DIAGNOSIS — R0602 Shortness of breath: Secondary | ICD-10-CM | POA: Diagnosis not present

## 2018-03-03 DIAGNOSIS — E1159 Type 2 diabetes mellitus with other circulatory complications: Secondary | ICD-10-CM

## 2018-03-03 DIAGNOSIS — I4892 Unspecified atrial flutter: Secondary | ICD-10-CM | POA: Diagnosis not present

## 2018-03-03 DIAGNOSIS — Z87891 Personal history of nicotine dependence: Secondary | ICD-10-CM | POA: Diagnosis not present

## 2018-03-03 DIAGNOSIS — E782 Mixed hyperlipidemia: Secondary | ICD-10-CM | POA: Diagnosis not present

## 2018-03-03 DIAGNOSIS — I110 Hypertensive heart disease with heart failure: Secondary | ICD-10-CM | POA: Diagnosis not present

## 2018-03-03 DIAGNOSIS — I5023 Acute on chronic systolic (congestive) heart failure: Secondary | ICD-10-CM

## 2018-03-03 DIAGNOSIS — I5043 Acute on chronic combined systolic (congestive) and diastolic (congestive) heart failure: Secondary | ICD-10-CM | POA: Diagnosis not present

## 2018-03-03 LAB — GLUCOSE, CAPILLARY
Glucose-Capillary: 100 mg/dL — ABNORMAL HIGH (ref 70–99)
Glucose-Capillary: 55 mg/dL — ABNORMAL LOW (ref 70–99)

## 2018-03-03 LAB — BASIC METABOLIC PANEL
Anion gap: 12 (ref 5–15)
BUN: 14 mg/dL (ref 8–23)
CALCIUM: 8.6 mg/dL — AB (ref 8.9–10.3)
CO2: 25 mmol/L (ref 22–32)
Chloride: 103 mmol/L (ref 98–111)
Creatinine, Ser: 1.01 mg/dL (ref 0.61–1.24)
GFR calc non Af Amer: >60 mL/min (ref >60)
Glucose, Bld: 67 mg/dL — ABNORMAL LOW (ref 70–99)
Potassium: 4 mmol/L (ref 3.5–5.1)
Sodium: 140 mmol/L (ref 135–145)

## 2018-03-03 NOTE — Discharge Summary (Signed)
Physician Discharge Summary  RUGER SAXER URK:[id number] DOB: Jul 24, 1945 DOA: 03/02/2018  PCP: Mikey Kirschner, MD  Admit date: 03/02/2018 Discharge date: 03/03/2018  Admitted From: home Discharge disposition: home   Recommendations for Outpatient Follow-Up:   1. Diuresed well with IV lasix-- patient to start weighing self daily 2. Cbc, bmp 1 week 3. May need lasix adjustment up   Discharge Diagnosis:   Principal Problem:   Acute on chronic systolic CHF (congestive heart failure), NYHA class 3 (HCC) Active Problems:   DM type 2 causing vascular disease ([place])   Mixed hyperlipidemia   HYPERTENSION   Current use of long term anticoagulation   Elevated troponin   Pacemaker    Discharge Condition: Improved.  Diet recommendation: Low sodium, heart healthy.  Carbohydrate-modified.  Wound care: None.  Code status: Full.   History of Present Illness:    John [name] is a 72 y.o. male with medical history significant of congestive heart failure with an ejection fraction of 45% noted on 02/06/2018, atrial flutter, SVT, status post permanent pacemaker placement, diabetes type 2 who presents the emergency department complaining of shortness of breath.  He was seen and [place] at the [facility] emergency department 5 days ago with complaints of increased shortness of breath.  He increased his Lasix dose and has been weighing himself daily.  His baseline weight is about 250 pounds on his scale at home and it has increased to 260.  His Lasix dose was increased however patient continued to feel short of breath despite being compliant with medication regimen.  His chest x-ray was obtained today and shows worsening edema with some right upper lobe patchy consolidation.  Does not use any home oxygen and on arrival here were 96% on room air.  He was placed on 2 L of oxygen for comfort.  Patient complains of increased edema in his feet and his stomach but no hip edema.  He has  had a new 2-3 pillow orthopnea and has woken up at night short of breath with paroxysmal nocturnal dyspnea.  He states he drinks approximately a quart of water a day but does not truly measure it.  He gets concerned when he is out working in the garden that he may need to drink more water.  Echocardiogram from November 2019 was reviewed and shows an ejection fraction of 45% with apical hypokinesis and mild aortic stenosis.  Patient was referred to me for further evaluation and management. Patient reports that his glucoses have been better recently since he is switched doctors.  His sugars have been acceptable at home.  His recent hemoglobin A1c on February 10, 2018 was 7.4.   Hospital Course by Problem:   1.  Acute on chronic systolic congestive heart failure New York Heart Association class III: Patient usually has class II symptoms however he has had a recent decompensation to class III symptoms.  Patient with recent two-pillow orthopnea, paroxysmal nocturnal dyspnea and increasing edema of the lower extremities.  Chest x-ray shows worsening edema with a possible consolidation but no complaints of cough, sputum production, elevated white blood cell count, or fever.  Presentation consistent with acute exacerbation of congestive heart failure.   Lasix 80 mg IV twice daily -- weight down 8lbs  and diuresed > 3L -was not weighing self at home-- will resume--- close outpatient follow up for medication adjustments  2.  Elevated troponin: This is from 3 December.  With no complaints of chest pain at  this time.  This is likely related to strain as patient has no complaints of chest pain and no abnormalities on EKG (this may not be as significant as the patient does have a pacemaker)  3.  Type 2 diabetes  -resume home regimen  4.  Permanent pacemaker: This is noted.  Recently interrogated and functioning properly.  5.  Mixed hyperlipidemia: Continue home medication management with Crestor.  6.   Hypertension: Continue medications as per patient's medication list to include lisinopril, carvedilol, amlodipine.   Medical Consultants:      Discharge Exam:   Vitals:   03/03/18 0223 03/03/18 0500  BP: 118/67 (!) 113/56  Pulse: 63 66  Resp: 20 18  Temp: 97.9 F (36.6 C) 98.4 F (36.9 C)  SpO2: 93% 96%   Vitals:   03/02/18 2341 03/03/18 0223 03/03/18 0232 03/03/18 0500  BP: 108/73 118/67  (!) 113/56  Pulse: 62 63  66  Resp: 18 20  18   Temp: 98.1 F (36.7 C) 97.9 F (36.6 C)  98.4 F (36.9 C)  TempSrc: Oral Oral  Oral  SpO2: 96% 93%  96%  Weight:   114.4 kg   Height:        General exam: Appears calm and comfortable.no LE edema Lungs clear   The results of significant diagnostics from this hospitalization (including imaging, microbiology, ancillary and laboratory) are listed below for reference.     Procedures and Diagnostic Studies:   Dg Chest 2 View  Result Date: 03/02/2018 CLINICAL DATA:  Initial evaluation for acute shortness of breath. EXAM: CHEST - 2 VIEW COMPARISON:  Prior radiograph from 02/25/2018 FINDINGS: Left-sided pacemaker/AICD in place. Cardiomegaly, stable. Mediastinal silhouette normal. Aortic atherosclerosis. Lungs normally inflated. Diffuse vascular congestion with interstitial prominence, compatible with diffuse pulmonary edema. No pleural effusion. Slightly more confluent patchy opacity within the right upper lobe, which could reflect superimposed infiltrate and/or atelectatic changes. No other consolidative airspace opacity. No pneumothorax. Osseous structures within normal limits. IMPRESSION: 1. Cardiomegaly with moderate diffuse pulmonary interstitial edema. 2. Slightly more confluent patchy opacity within the right upper lobe, which could reflect superimposed infiltrate and/or atelectatic changes. Electronically Signed   By: Jeannine Boga M.D.   On: 03/02/2018 05:38     Labs:   Basic Metabolic Panel: Recent Labs  Lab  02/25/18 0524 03/02/18 0426 03/03/18 0455  NA 142 142 140  K 3.5 3.9 4.0  CL 112* 105 103  CO2 24 27 25   GLUCOSE 67* 148* 67*  BUN 15 11 14   CREATININE 0.90 1.04 1.01  CALCIUM 8.6* 9.0 8.6*   GFR Estimated Creatinine Clearance: 85 mL/min (by C-G formula based on SCr of 1.01 mg/dL). Liver Function Tests: No results for input(s): AST, ALT, ALKPHOS, BILITOT, PROT, ALBUMIN in the last 168 hours. No results for input(s): LIPASE, AMYLASE in the last 168 hours. No results for input(s): AMMONIA in the last 168 hours. Coagulation profile No results for input(s): INR, PROTIME in the last 168 hours.  CBC: Recent Labs  Lab 02/25/18 0524 03/02/18 0426  WBC 9.0 8.1  NEUTROABS 6.2 4.7  HGB 11.5* 12.4*  HCT 36.7* 41.2  MCV 84.6 85.3  PLT 102* 133*   Cardiac Enzymes: Recent Labs  Lab 02/25/18 0524  TROPONINI 0.05*   BNP: Invalid input(s): POCBNP CBG: Recent Labs  Lab 03/02/18 0846 03/02/18 1242 03/02/18 1722 03/03/18 0737 03/03/18 0801  GLUCAP 89 126* 104* 55* 100*   D-Dimer No results for input(s): DDIMER in the last 72 hours. Hgb A1c  No results for input(s): HGBA1C in the last 72 hours. Lipid Profile No results for input(s): CHOL, HDL, LDLCALC, TRIG, CHOLHDL, LDLDIRECT in the last 72 hours. Thyroid function studies No results for input(s): TSH, T4TOTAL, T3FREE, THYROIDAB in the last 72 hours.  Invalid input(s): FREET3 Anemia work up No results for input(s): VITAMINB12, FOLATE, FERRITIN, TIBC, IRON, RETICCTPCT in the last 72 hours. Microbiology No results found for this or any previous visit (from the past 240 hour(s)).   Discharge Instructions:   Discharge Instructions    (HEART FAILURE PATIENTS) Call MD:  Anytime you have any of the following symptoms: 1) 3 pound weight gain in 24 hours or 5 pounds in 1 week 2) shortness of breath, with or without a dry hacking cough 3) swelling in the hands, feet or stomach 4) if you have to sleep on extra pillows at night in  order to breathe.   Complete by:  As directed    Diet - low sodium heart healthy   Complete by:  As directed    Diet Carb Modified   Complete by:  As directed    Increase activity slowly   Complete by:  As directed      Allergies as of 03/03/2018   No Known Allergies     Medication List    TAKE these medications   amLODipine 10 MG tablet Commonly known as:  NORVASC One daily What changed:    how much to take  how to take this  when to take this   apixaban 5 MG Tabs tablet Commonly known as:  ELIQUIS TAKE 1 TABLET BY MOUTH 2 (TWO) TIMES DAILY. What changed:    how much to take  how to take this  when to take this  additional instructions   benazepril 40 MG tablet Commonly known as:  LOTENSIN Take 1 tablet (40 mg total) by mouth daily.   carvedilol 25 MG tablet Commonly known as:  COREG TAKE 1 TABLET (25 MG TOTAL) BY MOUTH 2 (TWO) TIMES DAILY WITH A MEAL.   furosemide 40 MG tablet Commonly known as:  LASIX TAKE 1 TABLET TWICE DAILY What changed:  when to take this   glucose blood test strip 1 each by Other route as needed. Use as instructed bid   Magnesium 400 MG Caps Take 400 mg by mouth daily.   metFORMIN 1000 MG tablet Commonly known as:  GLUCOPHAGE TAKE 1 TABLET TWICE DAILY WITH MEALS   multivitamin with minerals tablet Take 1 tablet by mouth daily.   Resveratrol 100 MG Caps Take 100 mg by mouth daily.   rosuvastatin 20 MG tablet Commonly known as:  CRESTOR Take 1 tablet (20 mg total) by mouth daily.   tamsulosin 0.4 MG Caps capsule Commonly known as:  FLOMAX Take 1 capsule (0.4 mg total) by mouth at bedtime.   TRESIBA FLEXTOUCH 200 UNIT/ML Sopn Generic drug:  Insulin Degludec INJECT 70 UNITS INTO THE SKIN AT BEDTIME What changed:  See the new instructions.   TRULICITY 1.5 DU/2.0UR Sopn Generic drug:  Dulaglutide INJECT  1.5MG (CONTENTS OF 1 PEN)  INTO  THE  SKIN ONE TIME WEEKLY What changed:  See the new instructions.       Follow-up Information    Mikey Kirschner, MD Follow up in 1 week(s).   Specialty:  Family Medicine Contact information: [place] [address] [phone]        Herminio Commons, MD .   Specialty:  Cardiology  Contact information: [place] [address] [phone]            Time coordinating discharge: 25 min  Signed:  Geradine Girt DO  [facility] 03/03/2018, 9:24 AM

## 2018-03-03 NOTE — Plan of Care (Signed)
Patient ambulating in room independently, states that swelling in bil LE is much better.  Discussed all discharge teaching with patient and spouse at bedside.  Patient verbalized return appointment and all education provided.  No further questions at this time

## 2018-03-04 ENCOUNTER — Telehealth: Payer: Self-pay | Admitting: *Deleted

## 2018-03-04 NOTE — Telephone Encounter (Signed)
Patient stated he was doing much better now he was back home and will keep his follow up Monday 03/10/18

## 2018-03-04 NOTE — Telephone Encounter (Signed)
Mikey Kirschner, MD          Contact pt,see how he's doing and make sure taking his meds, f u visit with me next monday

## 2018-03-04 NOTE — Progress Notes (Signed)
Cardiology Office Note   Date:  03/05/2018   ID:  LYNN SISSEL, DOB 1945-12-31, MRN [id number]  PCP:  Mikey Kirschner, MD  Cardiologist:  Bronson Ing  Chief Complaint  Patient presents with  . Follow-up  . Shortness of Breath    A little.  . Congestive Heart Failure     History of Present Illness: FELIX MERAS is a 72 y.o. male who presents for ongoing assessment and management of atrial flutter-s/p ablation, PAF s/p DCCV, hx of high grade heart block-PPM in situ followed by Dr. Lovena Le, AoV stenosis, and chronic systolic CHF.   He was seen by Dr. Lovena Le on 01/31/2018, continued on carvedilol and Eliquis, He was ordered a follow up echocardiogram to assess for interval changes in LV function as previous echo determined his EF was 40%-45%. Pacemaker was functioning normally.   Echocardiogram completed on 01/27/2018 revealed EF was unchanged at 45%, with hypokinesis of the apical anterior, apical septal, and apical myocardium. Aortic valve was found to have mild stenosis, and moderate calcification of the coronary cusp. LA severely dilated.   He comes today without any cardiac complaints. He weighs himself daily and has not had to take extra lasix. He is medically compliant.   Past Medical History:  Diagnosis Date  . Atrial flutter ([place]) 12/2010   Admitted with symptomatic bradycardia (HR 40s), atrial flutter with slow ventricular response 12/2010 + volume overload; AV nodal agents d/c'd and Pradaxa started; RFA in 01/2011  . CHF (congestive heart failure) ([place])   . Chronic combined systolic and diastolic heart failure (HCC)    echo 01/06/11: mild LVH, EF 65-70%, mod to severe LAE, mild RVE, mild RAE, PASP 32;   TEE 10/12: EF 45-50%   . Class 2 severe obesity due to excess calories with serious comorbidity and body mass index (BMI) of 37.0 to 37.9 in adult ([place]) 07/17/2017  . Diabetes mellitus    non insulin dependant  . Hyperlipidemia   . Hypertension   . Osteoarthritis   .  Presence of permanent cardiac pacemaker   . PSVT (paroxysmal supraventricular tachycardia) (HCC)    Possibly atrial flutter    Past Surgical History:  Procedure Laterality Date  . ATRIAL FLUTTER ABLATION N/A 02/07/2011   Procedure: ATRIAL FLUTTER ABLATION;  Surgeon: Evans Lance, MD;  Location: [facility] CATH LAB;  Service: Cardiovascular;  Laterality: N/A;  . CARDIAC ELECTROPHYSIOLOGY STUDY AND ABLATION  02/07/11  . CARDIOVERSION N/A 03/23/2016   Procedure: CARDIOVERSION;  Surgeon: Evans Lance, MD;  Location: [place];  Service: Cardiovascular;  Laterality: N/A;  . COLONOSCOPY N/A 04/17/2017   Procedure: COLONOSCOPY;  Surgeon: Rogene Houston, MD;  Location[facility] ENDO SU[facility];  Service: Endoscopy;  Laterality: N/A;  830  . EP IMPLANTABLE DEVICE N/A 08/29/2015   Procedure: Pacemaker Implant;  Surgeon: Evans Lance, MD;  Location: [place] CV LAB;  Service: Cardiovascular;  Laterality: N/A;  . EP IMPLANTABLE DEVICE N/A 12/07/2015   Procedure: PPM Lead Revision/Repair;  Surgeon: Evans Lance, MD;  Location: [place] CV LAB;  Service: Cardiovascular;  Laterality: N/A;  . KNEE ARTHROSCOPY  2005   left  . LUMBAR SPINE SURGERY     "I've had 6 ORs 1972 thru 2004"  . POLYPECTOMY  04/17/2017   Procedure: POLYPECTOMY;  Surgeon: Rogene Houston, MD;  Location: [facility] ENDO [facility];  Service: Endoscopy;;  transverse colon x3;     Current Outpatient Medications  Medication Sig Dispense Refill  . amLODipine (NORVASC) 10 MG  tablet One daily (Patient taking differently: Take 10 mg by mouth daily. One daily) 90 tablet 1  . apixaban (ELIQUIS) 5 MG TABS tablet TAKE 1 TABLET BY MOUTH 2 (TWO) TIMES DAILY. (Patient taking differently: Take 5 mg by mouth 2 (two) times daily. ) 180 tablet 3  . benazepril (LOTENSIN) 40 MG tablet Take 1 tablet (40 mg total) by mouth daily. 90 tablet 1  . carvedilol (COREG) 25 MG tablet TAKE 1 TABLET (25 MG TOTAL) BY MOUTH 2 (TWO) TIMES DAILY WITH A MEAL. 180 tablet 3  .  furosemide (LASIX) 40 MG tablet TAKE 1 TABLET TWICE DAILY (Patient taking differently: Take 40 mg by mouth 2 (two) times daily. ) 180 tablet 0  . glucose blood test strip 1 each by Other route as needed. Use as instructed bid 100 each 3  . Magnesium 400 MG CAPS Take 400 mg by mouth daily.     . metFORMIN (GLUCOPHAGE) 1000 MG tablet TAKE 1 TABLET TWICE DAILY WITH MEALS (Patient taking differently: Take 1,000 mg by mouth 2 (two) times daily with a meal. ) 180 tablet 0  . Multiple Vitamins-Minerals (MULTIVITAMIN WITH MINERALS) tablet Take 1 tablet by mouth daily.    Marland Kitchen Resveratrol 100 MG CAPS Take 100 mg by mouth daily.     . rosuvastatin (CRESTOR) 20 MG tablet Take 1 tablet (20 mg total) by mouth daily. 90 tablet 1  . tamsulosin (FLOMAX) 0.4 MG CAPS capsule Take 1 capsule (0.4 mg total) by mouth at bedtime. 90 capsule 1  . TRESIBA FLEXTOUCH 200 UNIT/ML SOPN INJECT 70 UNITS INTO THE SKIN AT BEDTIME (Patient taking differently: Inject 70 Units as directed daily. ) 36 mL 0  . TRULICITY 1.5 GH/8.2XH SOPN INJECT  1.5MG (CONTENTS OF 1 PEN)  INTO  THE  SKIN ONE TIME WEEKLY (Patient taking differently: Inject 1.5 mg as directed every Wednesday. ) 15 mL 1   No current facility-administered medications for this visit.     Allergies:   Patient has no known allergies.    Social History:  The patient  reports that he quit smoking about 31 years ago. His smoking use included cigarettes. He has a 10.00 pack-year smoking history. He has never used smokeless tobacco. He reports that he drinks alcohol. He reports that he does not use drugs.   Family History:  The patient's family history includes Heart attack in his brother, father, and mother; Hypertension in his father and mother.    ROS: All other systems are reviewed and negative. Unless otherwise mentioned in H&P    PHYSICAL EXAM: VS:  BP 124/66 (BP Location: Left Arm, Patient Position: Sitting, Cuff Size: Large)   Pulse 85   Ht 5' 11"  (1.803 m)   Wt  257 lb (116.6 kg)   BMI 35.84 kg/m  , BMI Body mass index is 35.84 kg/m. GEN: Well nourished, well developed, in no acute distress HEENT: normal Neck: no JVD, carotid bruits, or masses Cardiac: RRR; no murmurs, rubs, or gallops,no edema  Respiratory:  Clear to auscultation bilaterally, normal work of breathing GI: soft, nontender, nondistended, + BS MS: no deformity or atrophy Skin: warm and dry, no rash Neuro:  Strength and sensation are intact Psych: euthymic mood, full affect   EKG:  Not on this office visit.   Recent Labs: 07/10/2017: TSH 1.940 02/10/2018: ALT 20 03/02/2018: B Natriuretic Peptide 917.2; Hemoglobin 12.4; Platelets 133 03/03/2018: BUN 14; Creatinine, Ser 1.01; Potassium 4.0; Sodium 140    Lipid Panel  Component Value Date/Time   CHOL 142 01/17/2018 0835   TRIG 90 01/17/2018 0835   HDL 53 01/17/2018 0835   CHOLHDL 2.7 01/17/2018 0835   CHOLHDL 3.2 11/16/2013 0702   VLDL 45 (H) 11/16/2013 0702   LDLCALC 71 01/17/2018 0835      Wt Readings from Last 3 Encounters:  03/05/18 257 lb (116.6 kg)  03/03/18 252 lb 1.6 oz (114.4 kg)  02/25/18 265 lb 14 oz (120.6 kg)      Other studies Reviewed: Echocardiogram 31-Jan-2018 Left ventricle: The cavity size was normal. Wall thickness was   increased in a pattern of mild LVH. Systolic function was mildly   reduced. The estimated ejection fraction was 45%. The study is   not technically sufficient to allow evaluation of LV diastolic   function. - Regional wall motion abnormality: Hypokinesis of the apical   anterior, apical inferior, apical septal, and apical myocardium. - Aortic valve: There was mild stenosis. Moderate calcification of   noncoronary cusp in particular. Valve area (VTI): 1.56 cm^2.   Valve area (Vmax): 1.49 cm^2. Valve area (Vmean): 1.53 cm^2. - Mitral valve: There was mild to moderate regurgitation. - Left atrium: The atrium was severely dilated. - Right ventricle: Pacer wire or catheter  noted in right ventricle. - Right atrium: Pacer wire or catheter noted in right atrium. - Tricuspid valve: There was mild regurgitation. - Pulmonary arteries: PA peak pressure: 30 mm Hg (S). - Systemic veins: The IVC is dilated with normal respiratory   variation. Estimated right atrial pressure is 8 mmHg.  ASSESSMENT AND PLAN:  1. Chronic Systolic CHF: He is maintaining his weight and doing his best to avoid salt. He is medically complaint. I have reinforced taking extra lasix prn for weight gain of 3-5 lbs to avoid hospitalization for decompensation. No changes in regimen  2 PPM in situ: Followed by Dr. Lovena Le.   3. PAF: Continues on Eliquis and coreg. No issues with bleeding .   4. Chronic AoV stenosis: Serial Echo's   Current medicines are reviewed at length with the patient today.    Labs/ tests ordered today include: None  Phill Myron. West Pugh, ANP, AACC   03/05/2018 [place] [address] Office [phone] Fax [phone]

## 2018-03-05 ENCOUNTER — Ambulatory Visit (INDEPENDENT_AMBULATORY_CARE_PROVIDER_SITE_OTHER): Payer: Medicare HMO | Admitting: Adult Health

## 2018-03-05 ENCOUNTER — Encounter: Payer: Self-pay | Admitting: Adult Health

## 2018-03-05 VITALS — BP 124/66 | HR 85 | Ht 71.0 in | Wt 257.0 lb

## 2018-03-05 DIAGNOSIS — I10 Essential (primary) hypertension: Secondary | ICD-10-CM

## 2018-03-05 DIAGNOSIS — I350 Nonrheumatic aortic (valve) stenosis: Secondary | ICD-10-CM | POA: Diagnosis not present

## 2018-03-05 DIAGNOSIS — I5022 Chronic systolic (congestive) heart failure: Secondary | ICD-10-CM | POA: Diagnosis not present

## 2018-03-05 DIAGNOSIS — I480 Paroxysmal atrial fibrillation: Secondary | ICD-10-CM | POA: Diagnosis not present

## 2018-03-05 NOTE — Patient Instructions (Signed)
Special Instructions: CONTINUE DAILY WEIGHTS-YOU MAY TAKE ADDITIONAL LASIX, IF YOUR WEIGHT IS >3 POUNDS DAILY OR >5 POUNDS IN ONE WEEK. PLEASE CALL OUR OFFICE TO LET us KNOW WHEN YOU HAVE INCREASED THESE MEDICATIONS [phone].  Medication Instructions:  NO CHANGES- Your physician recommends that you continue on your current medications as directed. Please refer to the Current Medication list given to you today.  If you need a refill on your cardiac medications before your next appointment, please call your pharmacy.  Labwork: If you have labs (blood work) drawn today and your tests are completely normal, you will receive your results only by: Marland Kitchen MyChart Message (if you have MyChart) OR . A paper copy in the mail If you have any lab test that is abnormal or we need to change your treatment, we will call you to review the results.  Follow-Up: You will need a follow up appointment in 4 months.  Please call our office 2 months in advance to schedule this appointment.  You may see [name], MD-ONLY or one of the following Advanced Practice Providers on your designated Care Team:  Bernerd Pho, PA-C ([place]) -[place], PA-C ([place]) At [employer] [facility], you and your health needs are our priority.  As part of our continuing mission to provide you with exceptional heart care, we have created designated Provider Care Teams.  These Care Teams include your primary Cardiologist (physician) and Advanced Practice Providers (APPs -  Physician Assistants and Nurse Practitioners) who all work together to provide you with the care you need, when you need it.  Marland KitchenHCA

## 2018-03-10 ENCOUNTER — Encounter: Payer: Self-pay | Admitting: Family Medicine

## 2018-03-10 ENCOUNTER — Ambulatory Visit (INDEPENDENT_AMBULATORY_CARE_PROVIDER_SITE_OTHER): Payer: Medicare HMO | Admitting: Family Medicine

## 2018-03-10 VITALS — BP 134/86 | Ht 71.0 in | Wt 260.6 lb

## 2018-03-10 DIAGNOSIS — I509 Heart failure, unspecified: Secondary | ICD-10-CM

## 2018-03-10 MED ORDER — BENZONATATE 100 MG PO CAPS: 100.0000 mg | ORAL_CAPSULE | Freq: Four times a day (QID) | ORAL | 2 refills | Status: DC | PRN

## 2018-03-10 NOTE — Progress Notes (Signed)
   Subjective:    Patient ID: John [name], male    DOB: 1945-05-15, 72 y.o.   MRN: [id number]  HPI Pt here today for hospital follow up. Pt went to [facility] ER and was sent home but patient states he still felt short of breath so he went to [facility] and they admitted him. He stayed overnight. Pt states he is feeling better.   Pt notes sig breathing etter   Now has gained onoly one and a half pound   On two lasix per day now   Pt wa having orthopnea,  Pt notes better now    Watching the salt intake.  Compliant with diuretic.  On further history admits that he backed on and off on his diuretic during family issues   Review of Systems No headache, no major weight loss or weight gain, no chest pain no back pain abdominal pain no change in bowel habits complete ROS otherwise negative     Objective:   Physical Exam Alert vitals stable, NAD. Blood pressure good on repeat. HEENT normal. Lungs clear. Heart regular rate and rhythm. Trace edema both ankles       Assessment & Plan:  Impression status post admission for congestive heart failure.  Importance of compliance with medications discussed.  Warning signs discussed.  Salt intake discussed.  Follow-up regular appointment

## 2018-03-17 ENCOUNTER — Telehealth: Payer: Self-pay

## 2018-03-17 ENCOUNTER — Other Ambulatory Visit: Payer: Self-pay | Admitting: "Endocrinology

## 2018-03-17 MED ORDER — INSULIN PEN NEEDLE 31G X 8 MM MISC: 1.0000 | 3 refills | Status: DC

## 2018-03-17 NOTE — Telephone Encounter (Signed)
PATIENT IS ASKING FOR  A RX FOR PEN NEEDLES  FOR THE TIPS OF HIS TRESIBA TO BE SENT TO [facility]

## 2018-03-20 ENCOUNTER — Other Ambulatory Visit: Payer: Self-pay | Admitting: "Endocrinology

## 2018-03-27 ENCOUNTER — Other Ambulatory Visit: Payer: Self-pay | Admitting: "Endocrinology

## 2018-04-16 ENCOUNTER — Ambulatory Visit (INDEPENDENT_AMBULATORY_CARE_PROVIDER_SITE_OTHER): Payer: Medicare HMO

## 2018-04-16 ENCOUNTER — Telehealth: Payer: Self-pay

## 2018-04-16 DIAGNOSIS — I442 Atrioventricular block, complete: Secondary | ICD-10-CM | POA: Diagnosis not present

## 2018-04-16 NOTE — Telephone Encounter (Signed)
Pt called needing help with his transmission. I was unsuccessful in helping him send. I gave him the number to [place] support.

## 2018-04-17 ENCOUNTER — Other Ambulatory Visit: Payer: Self-pay | Admitting: Cardiovascular Disease

## 2018-04-18 ENCOUNTER — Encounter: Payer: Self-pay | Admitting: Cardiology

## 2018-04-18 NOTE — Progress Notes (Signed)
Remote pacemaker transmission.   

## 2018-04-20 LAB — CUP PACEART REMOTE DEVICE CHECK
Battery Impedance: 135 Ohm
Battery Remaining Longevity: 114 mo
Battery Voltage: 2.79 V
Brady Statistic AP VP Percent: 22 %
Date Time Interrogation Session: 20200123205852
Implantable Lead Implant Date: 20170605
Implantable Lead Implant Date: 20170605
Implantable Lead Location: 753859
Implantable Lead Model: 5076
Implantable Lead Model: 5076
Implantable Pulse Generator Implant Date: 20170605
Lead Channel Impedance Value: 508 Ohm
Lead Channel Pacing Threshold Amplitude: 0.625 V
Lead Channel Pacing Threshold Amplitude: 0.875 V
Lead Channel Pacing Threshold Pulse Width: 0.4 ms
Lead Channel Setting Pacing Amplitude: 2 V
Lead Channel Setting Pacing Amplitude: 2.5 V
Lead Channel Setting Pacing Pulse Width: 0.4 ms
Lead Channel Setting Sensing Sensitivity: 4 mV
MDC IDC LEAD LOCATION: 753860
MDC IDC MSMT LEADCHNL RA PACING THRESHOLD PULSEWIDTH: 0.4 ms
MDC IDC MSMT LEADCHNL RV IMPEDANCE VALUE: 382 Ohm
MDC IDC STAT BRADY AP VS PERCENT: 0 %
MDC IDC STAT BRADY AS VP PERCENT: 38 %
MDC IDC STAT BRADY AS VS PERCENT: 39 %

## 2018-04-24 ENCOUNTER — Telehealth: Payer: Self-pay | Admitting: Adult Health

## 2018-04-24 NOTE — Telephone Encounter (Signed)
Spoke with pt, he has taken 40 mg of furosemide three times daily for the last week and has had no change in his weight. The swelling is gone in the morning and is there by the end of the day. His SOB is climbing the stairs from the basement to the main level of the house. He reports he is seeing some change in the swelling but the weight is the same. Will forward to [employer] to review and advise.

## 2018-04-24 NOTE — Telephone Encounter (Addendum)
Spoke with pt, Follow up scheduled with John [name] lawrence np on Monday per patient request. No availability in [place]. Patient voiced understanding to go to the ER if symptoms worsen prior to appointment.

## 2018-04-24 NOTE — Telephone Encounter (Signed)
New Message   Pt c/o swelling: STAT is pt has developed SOB within 24 hours  1) How much weight have you gained and in what time span? 6lbs in a week in a half  2) If swelling, where is the swelling located? Leg, and mostly feet   3) Are you currently taking a fluid pill? Lasix   4) Are you currently SOB? At times but not often.  Indicates when he goes up stairs he does.   5) Do you have a log of your daily weights (if so, list)? No   6) Have you gained 3 pounds in a day or 5 pounds in a week? 5lbs in a week  7) Have you traveled recently? NO

## 2018-04-24 NOTE — Telephone Encounter (Signed)
Please have him follow up with appointment. He was seen one month ago and was doing well. He can have some swelling on amlodipine, better in the am after keeping his feet up and worse by the end of the day after being on his feet.

## 2018-04-27 NOTE — Progress Notes (Signed)
Cardiology Office Note   Date:  04/28/2018   ID:  John, [name] Jun 24, 1945, MRN [id number]  PCP:  John Kirschner, MD  Cardiologist:  John [name]   Chief Complaint  Patient presents with  . Leg Swelling  . Congestive Heart Failure     History of Present Illness: John [name] is a 73 y.o. male who presents for ongoing assessment and management of atrial flutter-s/p ablation, PAF s/p DCCV, hx of high grade heart block-PPM in situ followed by Dr. Lovena Le, AoV stenosis, and chronic systolic CHF, continued on carvedilol and Eliquis, He was ordered a follow up echocardiogram to assess for interval changes in LV function as previous echo determined his EF was 40%-45%. Pacemaker was functioning normally.   Echocardiogram completed on 01/27/2018 revealed EF was unchanged at 45%, with hypokinesis of the apical anterior, apical septal, and apical myocardium. Aortic valve was found to have mild stenosis, and moderate calcification of the coronary cusp. LA severely dilated.   He called our office on 04/24/2017 with complaints of LEE. He was advised to continue his current regimen. Due to unavailability in the [place] office, he has follow up at [place] today.  He has gained 11 lbs since December and has worsening LEE. He states that the edema is worse at the end of the day, better in the morning. His breathing status is a little worse.  He admits to some salt intake. He medically complaint.  Weight is up 8 lbs since 03/11/2019.  Past Medical History:  Diagnosis Date  . Atrial flutter ([place]) 12/2010   Admitted with symptomatic bradycardia (HR 40s), atrial flutter with slow ventricular response 12/2010 + volume overload; AV nodal agents d/c'd and Pradaxa started; RFA in 01/2011  . CHF (congestive heart failure) ([place])   . Chronic combined systolic and diastolic heart failure (HCC)    echo 01/06/11: mild LVH, EF 65-70%, mod to severe LAE, mild RVE, mild RAE, PASP 32;   TEE 10/12: EF 45-50%   .  Class 2 severe obesity due to excess calories with serious comorbidity and body mass index (BMI) of 37.0 to 37.9 in adult ([place]) 07/17/2017  . Diabetes mellitus    non insulin dependant  . Hyperlipidemia   . Hypertension   . Osteoarthritis   . Presence of permanent cardiac pacemaker   . PSVT (paroxysmal supraventricular tachycardia) (HCC)    Possibly atrial flutter    Past Surgical History:  Procedure Laterality Date  . ATRIAL FLUTTER ABLATION N/A 02/07/2011   Procedure: ATRIAL FLUTTER ABLATION;  Surgeon: [name] Lance, MD;  Location: [name] CATH LAB;  Service: Cardiovascular;  Laterality: N/A;  . CARDIAC ELECTROPHYSIOLOGY STUDY AND ABLATION  02/07/11  . CARDIOVERSION N/A 03/23/2016   Procedure: CARDIOVERSION;  Surgeon: [name] Lance, MD;  Location: [place];  Service: Cardiovascular;  Laterality: N/A;  . COLONOSCOPY N/A 04/17/2017   Procedure: COLONOSCOPY;  Surgeon: John Houston, MD;  [facility];  Service: Endoscopy;  Laterality: N/A;  830  . EP IMPLANTABLE DEVICE N/A 08/29/2015   Procedure: Pacemaker Implant;  Surgeon: [name] Lance, MD;  Location: [place] CV LAB;  Service: Cardiovascular;  Laterality: N/A;  . EP IMPLANTABLE DEVICE N/A 12/07/2015   Procedure: PPM Lead Revision/Repair;  Surgeon: [name] Lance, MD;  Location: [place] CV LAB;  Service: Cardiovascular;  Laterality: N/A;  . KNEE ARTHROSCOPY  2005   left  . LUMBAR SPINE SURGERY     "I've had 6 ORs 1972 thru 2004"  .  POLYPECTOMY  04/17/2017   Procedure: POLYPECTOMY;  Surgeon: John Houston, MD;  [facility];  Service: Endoscopy;;  transverse colon x3;     Current Outpatient Medications  Medication Sig Dispense Refill  . amLODipine (NORVASC) 10 MG tablet One daily (Patient taking differently: Take 10 mg by mouth daily. One daily) 90 tablet 1  . apixaban (ELIQUIS) 5 MG TABS tablet TAKE 1 TABLET BY MOUTH 2 (TWO) TIMES DAILY. (Patient taking differently: Take 5 mg by mouth 2 (two) times  daily. ) 180 tablet 3  . benazepril (LOTENSIN) 40 MG tablet Take 1 tablet (40 mg total) by mouth daily. 90 tablet 1  . benzonatate (TESSALON PERLES) 100 MG capsule Take 1 capsule (100 mg total) by mouth every 6 (six) hours as needed for cough. 30 capsule 2  . carvedilol (COREG) 25 MG tablet TAKE 1 TABLET (25 MG TOTAL) BY MOUTH 2 (TWO) TIMES DAILY WITH A MEAL. 180 tablet 3  . glucose blood test strip 1 each by Other route as needed. Use as instructed bid 100 each 3  . Insulin Pen Needle 31G X 8 MM MISC USE EVERY DAY AS DIRECTED BY PHYSICIAN 100 each 4  . Magnesium 400 MG CAPS Take 400 mg by mouth daily.     . metFORMIN (GLUCOPHAGE) 1000 MG tablet TAKE 1 TABLET TWICE DAILY WITH MEALS 180 tablet 0  . Multiple Vitamins-Minerals (MULTIVITAMIN WITH MINERALS) tablet Take 1 tablet by mouth daily.    Marland Kitchen Resveratrol 100 MG CAPS Take 100 mg by mouth daily.     . rosuvastatin (CRESTOR) 20 MG tablet Take 1 tablet (20 mg total) by mouth daily. 90 tablet 1  . tamsulosin (FLOMAX) 0.4 MG CAPS capsule Take 1 capsule (0.4 mg total) by mouth at bedtime. 90 capsule 1  . TRESIBA FLEXTOUCH 200 UNIT/ML SOPN INJECT 70 UNITS SUBCUTANEOUSLY AT BEDTIME 36 mL 0  . TRULICITY 1.5 PR/9.1MB SOPN INJECT  1.5MG (CONTENTS OF 1 PEN)  INTO  THE  SKIN ONE TIME WEEKLY (Patient taking differently: Inject 1.5 mg as directed every Wednesday. ) 15 mL 1  . torsemide (DEMADEX) 20 MG tablet Take 1 tablet (20 mg total) by mouth 2 (two) times daily. 180 tablet 0   No current facility-administered medications for this visit.     Allergies:   Patient has no known allergies.    Social History:  The patient  reports that he quit smoking about 32 years ago. His smoking use included cigarettes. He has a 10.00 pack-year smoking history. He has never used smokeless tobacco. He reports current alcohol use. He reports that he does not use drugs.   Family History:  The patient's family history includes Heart attack in his brother, father, and mother;  Hypertension in his father and mother.    ROS: All other systems are reviewed and negative. Unless otherwise mentioned in H&P    PHYSICAL EXAM: VS:  BP 128/64   Pulse 86   Ht 5' 11"  (1.803 m)   Wt 268 lb (121.6 kg)   BMI 37.38 kg/m  , BMI Body mass index is 37.38 kg/m. GEN: Well nourished, well developed, in no acute distress HEENT: normal Neck: no JVD, carotid bruits, or masses Cardiac: RRR; 1/6 systolic murmurs, rubs, or gallops,2+ pitting edema  Respiratory:  Clear to auscultation bilaterally, normal work of breathing GI: soft, nontender, nondistended, + BS MS: no deformity or atrophy Skin: warm and dry, no rash Neuro:  Strength and sensation are intact Psych: euthymic mood,  full affect   EKG: Ventricular paced rhythm rate of 86 bpm.   Recent Labs: 07/10/2017: TSH 1.940 02/10/2018: ALT 20 03/02/2018: B Natriuretic Peptide 917.2; Hemoglobin 12.4; Platelets 133 03/03/2018: BUN 14; Creatinine, Ser 1.01; Potassium 4.0; Sodium 140    Lipid Panel    Component Value Date/Time   CHOL 142 01/17/2018 0835   TRIG 90 01/17/2018 0835   HDL 53 01/17/2018 0835   CHOLHDL 2.7 01/17/2018 0835   CHOLHDL 3.2 11/16/2013 0702   VLDL 45 (H) 11/16/2013 0702   LDLCALC 71 01/17/2018 0835      Wt Readings from Last 3 Encounters:  04/28/18 268 lb (121.6 kg)  03/10/18 260 lb 9.6 oz (118.2 kg)  03/05/18 257 lb (116.6 kg)      Other studies Reviewed: Echocardiogram 02/18/18 Left ventricle: The cavity size was normal. Wall thickness was increased in a pattern of mild LVH. Systolic function was mildly reduced. The estimated ejection fraction was 45%. The study is not technically sufficient to allow evaluation of LV diastolic function. - Regional wall motion abnormality: Hypokinesis of the apical anterior, apical inferior, apical septal, and apical myocardium. - Aortic valve: There was mild stenosis. Moderate calcification of noncoronary cusp in particular. Valve area  (VTI): 1.56 cm^2. Valve area (Vmax): 1.49 cm^2. Valve area (Vmean): 1.53 cm^2. - Mitral valve: There was mild to moderate regurgitation. - Left atrium: The atrium was severely dilated. - Right ventricle: Pacer wire or catheter noted in right ventricle. - Right atrium: Pacer wire or catheter noted in right atrium. - Tricuspid valve: There was mild regurgitation. - Pulmonary arteries: PA peak pressure: 30 mm Hg (S). - Systemic veins: The IVC is dilated with normal respiratory variation. Estimated right atrial pressure is 8 mmHg.  ASSESSMENT AND PLAN:  1. Acute on Chronic Systolic CHF: His weight is up 8 lbs since Dec 16.2019 and 11 lbs since 03/05/2018. I will change is lasix to torsemide 20 mg BID, from lasix 40 mg BID. He will take two torsemide in the am tomorrow on an empty stomach. He will weigh himself every day and record this.  Will have a BMET completed in one week and then see him thereafter. He is advised on low sodium diet and use of support hose for venous insufficiency.   2. Hypertension: Continue coreg BID, benazepril 40 mg daily. Follow up BMET.   3, Type II Diabetes: Follow up with PCP for ongoing management.   4. Hypercholesterolemia: Continue rosuvastatin.    Current medicines are reviewed at length with the patient today.    Labs/ tests ordered today include: BMET  Phill Myron. [place] Pugh, ANP, AACC   04/28/2018 1:33 PM    [facility] [place] [address] Office [phone] Fax [phone]

## 2018-04-28 ENCOUNTER — Encounter: Payer: Self-pay | Admitting: Adult Health

## 2018-04-28 ENCOUNTER — Ambulatory Visit (INDEPENDENT_AMBULATORY_CARE_PROVIDER_SITE_OTHER): Payer: Medicare HMO | Admitting: Adult Health

## 2018-04-28 VITALS — BP 128/64 | HR 86 | Ht 71.0 in | Wt 268.0 lb

## 2018-04-28 DIAGNOSIS — I5031 Acute diastolic (congestive) heart failure: Secondary | ICD-10-CM

## 2018-04-28 DIAGNOSIS — Z79899 Other long term (current) drug therapy: Secondary | ICD-10-CM | POA: Diagnosis not present

## 2018-04-28 DIAGNOSIS — I10 Essential (primary) hypertension: Secondary | ICD-10-CM

## 2018-04-28 DIAGNOSIS — E7800 Pure hypercholesterolemia, unspecified: Secondary | ICD-10-CM

## 2018-04-28 MED ORDER — BENZONATATE 100 MG PO CAPS: 100.0000 mg | ORAL_CAPSULE | Freq: Four times a day (QID) | ORAL | 2 refills | Status: DC | PRN

## 2018-04-28 MED ORDER — TORSEMIDE 20 MG PO TABS: 20.0000 mg | ORAL_TABLET | Freq: Two times a day (BID) | ORAL | 0 refills | Status: DC

## 2018-04-28 NOTE — Patient Instructions (Signed)
Medication Instructions:  STOP LASIX START TORSEMIDE 40MG IN THE AM AND 20MG IN THE PM---> THEN ON Thursday START TORSEMIDE 20MG TWICE DAILY If you need a refill on your cardiac medications before your next appointment, please call your pharmacy.  Labwork: BMET-ONE WEEK ON 05-02-2018(FRIDAY) HERE IN OUR OFFICE AT [facility]   You will NOT need to fast   Take the provided lab slips with you to the lab for your blood draw.    When you have your labs (blood work) drawn today and your tests are completely normal, you will receive your results only by MyChart Message (if you have MyChart) -OR-  A paper copy in the mail.  If you have any lab test that is abnormal or we need to change your treatment, we will call you to review these results.  Follow-Up: You will need a follow up appointment in 1 weeks-APPOINTMENT [place], [place], [place] ON 05-05-2018 @ [place] may see [name], MD or one of the following Advanced Practice Providers on your designated Care Team:  Murray Hodgkins, NP  Christell Faith, PA-C  Marrianne Mood, PA-C    At [facility], you and your health needs are our priority.  As part of our continuing mission to provide you with exceptional heart care, we have created designated Provider Care Teams.  These Care Teams include your primary Cardiologist (physician) and Advanced Practice Providers (APPs -  Physician Assistants and Nurse Practitioners) who all work together to provide you with the care you need, when you need it.  Thank you for choosing [facility] at [facility]!!

## 2018-05-02 DIAGNOSIS — I5031 Acute diastolic (congestive) heart failure: Secondary | ICD-10-CM | POA: Diagnosis not present

## 2018-05-02 DIAGNOSIS — Z79899 Other long term (current) drug therapy: Secondary | ICD-10-CM | POA: Diagnosis not present

## 2018-05-03 LAB — BASIC METABOLIC PANEL
BUN / CREAT RATIO: 16 (ref 10–24)
BUN: 18 mg/dL (ref 8–27)
CO2: 24 mmol/L (ref 20–29)
Calcium: 9.2 mg/dL (ref 8.6–10.2)
Chloride: 102 mmol/L (ref 96–106)
Creatinine, Ser: 1.12 mg/dL (ref 0.76–1.27)
GFR calc Af Amer: 75 mL/min/{1.73_m2} (ref >59)
GFR calc non Af Amer: 65 mL/min/{1.73_m2} (ref >59)
Glucose: 145 mg/dL — ABNORMAL HIGH (ref 65–99)
Potassium: 4.6 mmol/L (ref 3.5–5.2)
Sodium: 141 mmol/L (ref 134–144)

## 2018-05-04 NOTE — Progress Notes (Signed)
Cardiology Office Note   Date:  05/05/2018   ID:  John [name] 1945/05/17, MRN [id number]  PCP:  Mikey Kirschner, MD  Cardiologist:  Dickie La chief complaint on file.    History of Present Illness: John [name] is a 73 y.o. male who presents for for ongoing assessment and management of atrial flutter-s/p ablation, PAF s/p DCCV, hx of high grade heart block-PPM in situ followed by Dr. Lovena Le, AoV stenosis, and chronic systolic CHF, continued on carvedilol and Eliquis, He was ordered a follow up echocardiogram to assess for interval changes in LV function as previous echo determined his EF was 40%-45%. Pacemaker was functioning normally.   At last office visit on 2/04/28/2018 he had gained 11 lbs and had evidence of fluid overload. He was changed to torsemide 20 mg BID and advised on daily weights, with BMET to follow. Labs were reviewed and found to be WNL. Creatinine 1.12.    He comes today feeling some better. He is getting a good response from the change in his diuretic. He is avoiding salt, but admits that it is difficult for him. Weight is unchanged but he is wearing heavy boots and clothes this visit. He is breathing better. He states that his legs look better in the am than during the day.   Past Medical History:  Diagnosis Date  . Atrial flutter ([place]) 12/2010   Admitted with symptomatic bradycardia (HR 40s), atrial flutter with slow ventricular response 12/2010 + volume overload; AV nodal agents d/c'd and Pradaxa started; RFA in 01/2011  . CHF (congestive heart failure) ([place])   . Chronic combined systolic and diastolic heart failure (HCC)    echo 01/06/11: mild LVH, EF 65-70%, mod to severe LAE, mild RVE, mild RAE, PASP 32;   TEE 10/12: EF 45-50%   . Class 2 severe obesity due to excess calories with serious comorbidity and body mass index (BMI) of 37.0 to 37.9 in adult ([place]) 07/17/2017  . Diabetes mellitus    non insulin dependant  . Hyperlipidemia   .  Hypertension   . Osteoarthritis   . Presence of permanent cardiac pacemaker   . PSVT (paroxysmal supraventricular tachycardia) (HCC)    Possibly atrial flutter    Past Surgical History:  Procedure Laterality Date  . ATRIAL FLUTTER ABLATION N/A 02/07/2011   Procedure: ATRIAL FLUTTER ABLATION;  Surgeon: Evans Lance, MD;  Location[facility] CATH LAB;  Service: Cardiovascular;  Laterality: N/A;  . CARDIAC ELECTROPHYSIOLOGY STUDY AND ABLATION  02/07/11  . CARDIOVERSION N/A 03/23/2016   Procedure: CARDIOVERSION;  Surgeon: Evans Lance, MD;  Location: [place];  Service: Cardiovascular;  Laterality: N/A;  . COLONOSCOPY N/A 04/17/2017   Procedure: COLONOSCOPY;  Surgeon: Rogene Houston, MD;  [facility];  Service: Endoscopy;  Laterality: N/A;  830  . EP IMPLANTABLE DEVICE N/A 08/29/2015   Procedure: Pacemaker Implant;  Surgeon: Evans Lance, MD;  Location: [place] CV LAB;  Service: Cardiovascular;  Laterality: N/A;  . EP IMPLANTABLE DEVICE N/A 12/07/2015   Procedure: PPM Lead Revision/Repair;  Surgeon: Evans Lance, MD;  Location: [place] CV LAB;  Service: Cardiovascular;  Laterality: N/A;  . KNEE ARTHROSCOPY  2005   left  . LUMBAR SPINE SURGERY     "I've had 6 ORs 1972 thru 2004"  . POLYPECTOMY  04/17/2017   Procedure: POLYPECTOMY;  Surgeon: Rogene Houston, MD;  [facility];  Service: Endoscopy;;  transverse colon x3;  Current Outpatient Medications  Medication Sig Dispense Refill  . amLODipine (NORVASC) 10 MG tablet One daily (Patient taking differently: Take 10 mg by mouth daily. One daily) 90 tablet 1  . apixaban (ELIQUIS) 5 MG TABS tablet TAKE 1 TABLET BY MOUTH 2 (TWO) TIMES DAILY. (Patient taking differently: Take 5 mg by mouth 2 (two) times daily. ) 180 tablet 3  . benazepril (LOTENSIN) 40 MG tablet Take 1 tablet (40 mg total) by mouth daily. 90 tablet 1  . benzonatate (TESSALON PERLES) 100 MG capsule Take 1 capsule (100 mg total) by mouth  every 6 (six) hours as needed for cough. 30 capsule 2  . carvedilol (COREG) 25 MG tablet TAKE 1 TABLET (25 MG TOTAL) BY MOUTH 2 (TWO) TIMES DAILY WITH A MEAL. 180 tablet 3  . glucose blood test strip 1 each by Other route as needed. Use as instructed bid 100 each 3  . Insulin Pen Needle 31G X 8 MM MISC USE EVERY DAY AS DIRECTED BY PHYSICIAN 100 each 4  . Magnesium 400 MG CAPS Take 400 mg by mouth daily.     . metFORMIN (GLUCOPHAGE) 1000 MG tablet TAKE 1 TABLET TWICE DAILY WITH MEALS 180 tablet 0  . Multiple Vitamins-Minerals (MULTIVITAMIN WITH MINERALS) tablet Take 1 tablet by mouth daily.    Marland Kitchen Resveratrol 100 MG CAPS Take 100 mg by mouth daily.     . rosuvastatin (CRESTOR) 20 MG tablet Take 1 tablet (20 mg total) by mouth daily. 90 tablet 1  . tamsulosin (FLOMAX) 0.4 MG CAPS capsule Take 1 capsule (0.4 mg total) by mouth at bedtime. 90 capsule 1  . torsemide (DEMADEX) 20 MG tablet Take 1 tablet (20 mg total) by mouth 2 (two) times daily. 180 tablet 0  . TRESIBA FLEXTOUCH 200 UNIT/ML SOPN INJECT 70 UNITS SUBCUTANEOUSLY AT BEDTIME 36 mL 0  . TRULICITY 1.5 DU/2.0UR SOPN INJECT  1.5MG (CONTENTS OF 1 PEN)  INTO  THE  SKIN ONE TIME WEEKLY (Patient taking differently: Inject 1.5 mg as directed every Wednesday. ) 15 mL 1   No current facility-administered medications for this visit.     Allergies:   Patient has no known allergies.    Social History:  The patient  reports that he quit smoking about 32 years ago. His smoking use included cigarettes. He has a 10.00 pack-year smoking history. He has never used smokeless tobacco. He reports current alcohol use. He reports that he does not use drugs.   Family History:  The patient's family history includes Heart attack in his brother, father, and mother; Hypertension in his father and mother.    ROS: All other systems are reviewed and negative. Unless otherwise mentioned in H&P    PHYSICAL EXAM: VS:  BP 120/66   Pulse 87   Ht 5' 11"  (1.803 m)   Wt  268 lb 9.6 oz (121.8 kg)   BMI 37.46 kg/m  , BMI Body mass index is 37.46 kg/m. GEN: Well nourished, well developed, in no acute distress HEENT: normal Neck: no JVD, carotid bruits, or masses Cardiac: RRR; 2/6 systolic murmurs, rubs, or gallops, 1+-2+ dependent edema  Respiratory:  Clear to auscultation bilaterally, normal work of breathing GI: soft, nontender, nondistended, + BS, obese.  MS: no deformity or atrophy Skin: warm and dry, no rash Neuro:  Strength and sensation are intact Psych: euthymic mood, full affect   EKG:  Not completed this office visit.   Recent Labs: 07/10/2017: TSH 1.940 02/10/2018: ALT 20 03/02/2018: B Natriuretic  Peptide 917.2; Hemoglobin 12.4; Platelets 133 05/02/2018: BUN 18; Creatinine, Ser 1.12; Potassium 4.6; Sodium 141    Lipid Panel    Component Value Date/Time   CHOL 142 01/17/2018 0835   TRIG 90 01/17/2018 0835   HDL 53 01/17/2018 0835   CHOLHDL 2.7 01/17/2018 0835   CHOLHDL 3.2 11/16/2013 0702   VLDL 45 (H) 11/16/2013 0702   LDLCALC 71 01/17/2018 0835      Wt Readings from Last 3 Encounters:  05/05/18 268 lb 9.6 oz (121.8 kg)  04/28/18 268 lb (121.6 kg)  03/10/18 260 lb 9.6 oz (118.2 kg)      Other studies Reviewed: Echocardiogram 23-Feb-2018 Left ventricle: The cavity size was normal. Wall thickness was   increased in a pattern of mild LVH. Systolic function was mildly   reduced. The estimated ejection fraction was 45%. The study is   not technically sufficient to allow evaluation of LV diastolic   function. - Regional wall motion abnormality: Hypokinesis of the apical   anterior, apical inferior, apical septal, and apical myocardium. - Aortic valve: There was mild stenosis. Moderate calcification of   noncoronary cusp in particular. Valve area (VTI): 1.56 cm^2.   Valve area (Vmax): 1.49 cm^2. Valve area (Vmean): 1.53 cm^2. - Mitral valve: There was mild to moderate regurgitation. - Left atrium: The atrium was severely  dilated. - Right ventricle: Pacer wire or catheter noted in right ventricle. - Right atrium: Pacer wire or catheter noted in right atrium. - Tricuspid valve: There was mild regurgitation. - Pulmonary arteries: PA peak pressure: 30 mm Hg (S). - Systemic veins: The IVC is dilated with normal respiratory   variation. Estimated right atrial pressure is 8 mmHg.  ASSESSMENT AND PLAN:  1. Chronic Mixed CHF: Diastolic function was difficult to calculate last echo. He is better on torsemide than on lasix. He continues to have some evidence of fluid overload. I have told him to continue to weight himself daily and avoid salt. He is to take afternoon lasix earlier today, keep his feet elevated. Think he has lost approximately 5-6 lbs but with heavy clothes it is difficult to have accurate weight compared to last office visit. He is to take extra torsemide for weight gain or fluid retention.   2. Hypertension: Remains on amlodipine, benazepril, and carvedilol. BP is very well controlled.    3. Atrial fib: Remains on Eliquis.HR is well controlled.   4. Diabetes:  Followed by Dr. Wolfgang Phoenix.   Current medicines are reviewed at length with the patient today.    Labs/ tests ordered today include: None  John [name], ANP, AACC   05/05/2018 4:24 PM    [address] Office [phone] Fax [phone]

## 2018-05-05 ENCOUNTER — Ambulatory Visit (INDEPENDENT_AMBULATORY_CARE_PROVIDER_SITE_OTHER): Payer: Medicare HMO | Admitting: Adult Health

## 2018-05-05 ENCOUNTER — Encounter: Payer: Self-pay | Admitting: Adult Health

## 2018-05-05 VITALS — BP 120/66 | HR 87 | Ht 71.0 in | Wt 268.6 lb

## 2018-05-05 DIAGNOSIS — I5042 Chronic combined systolic (congestive) and diastolic (congestive) heart failure: Secondary | ICD-10-CM

## 2018-05-05 DIAGNOSIS — I480 Paroxysmal atrial fibrillation: Secondary | ICD-10-CM

## 2018-05-05 DIAGNOSIS — I10 Essential (primary) hypertension: Secondary | ICD-10-CM

## 2018-05-05 DIAGNOSIS — Z950 Presence of cardiac pacemaker: Secondary | ICD-10-CM | POA: Diagnosis not present

## 2018-05-05 MED ORDER — BENZONATATE 100 MG PO CAPS: 100.0000 mg | ORAL_CAPSULE | Freq: Four times a day (QID) | ORAL | 2 refills | Status: DC | PRN

## 2018-05-05 NOTE — Patient Instructions (Signed)
Follow-Up: You will need a follow up appointment in 4-5 months.  You may see [name], MD or one of the following Advanced Practice Providers on your designated Care Team:  Bernerd Pho, PA-C ([employer]) Ermalinda Barrios, PA-C ([place]) Medication Instructions:  NO CHANGES- Your physician recommends that you continue on your current medications as directed. Please refer to the Current Medication list given to you today. If you need a refill on your cardiac medications before your next appointment, please call your pharmacy. Labwork: When you have labs (blood work) and your tests are completely normal, you will receive your results ONLY by [place] (if you have MyChart) -OR- A paper copy in the mail.  At [facility], you and your health needs are our priority.  As part of our continuing mission to provide you with exceptional heart care, we have created designated Provider Care Teams.  These Care Teams include your primary Cardiologist (physician) and Advanced Practice Providers (APPs -  Physician Assistants and Nurse Practitioners) who all work together to provide you with the care you need, when you need it.  Thank you for choosing [facility] at [facility]!!

## 2018-05-06 ENCOUNTER — Encounter: Payer: Self-pay | Admitting: Family Medicine

## 2018-05-06 LAB — HM DIABETES EYE EXAM

## 2018-05-14 ENCOUNTER — Other Ambulatory Visit: Payer: Self-pay | Admitting: Cardiovascular Disease

## 2018-05-19 ENCOUNTER — Other Ambulatory Visit: Payer: Self-pay | Admitting: Family Medicine

## 2018-05-26 ENCOUNTER — Other Ambulatory Visit: Payer: Self-pay | Admitting: Adult Health

## 2018-06-11 ENCOUNTER — Other Ambulatory Visit: Payer: Self-pay | Admitting: "Endocrinology

## 2018-06-11 DIAGNOSIS — E1159 Type 2 diabetes mellitus with other circulatory complications: Secondary | ICD-10-CM | POA: Diagnosis not present

## 2018-06-12 LAB — COMPREHENSIVE METABOLIC PANEL
ALT: 26 IU/L (ref 0–44)
AST: 37 IU/L (ref 0–40)
Albumin/Globulin Ratio: 1.2 (ref 1.2–2.2)
Albumin: 4.3 g/dL (ref 3.7–4.7)
Alkaline Phosphatase: 144 IU/L — ABNORMAL HIGH (ref 39–117)
BUN/Creatinine Ratio: 16 (ref 10–24)
BUN: 21 mg/dL (ref 8–27)
Bilirubin Total: 1.7 mg/dL — ABNORMAL HIGH (ref 0.0–1.2)
CALCIUM: 9.3 mg/dL (ref 8.6–10.2)
CO2: 24 mmol/L (ref 20–29)
Chloride: 102 mmol/L (ref 96–106)
Creatinine, Ser: 1.31 mg/dL — ABNORMAL HIGH (ref 0.76–1.27)
GFR calc Af Amer: 62 mL/min/{1.73_m2} (ref >59)
GFR, EST NON AFRICAN AMERICAN: 54 mL/min/{1.73_m2} — AB (ref >59)
Globulin, Total: 3.6 g/dL (ref 1.5–4.5)
Glucose: 95 mg/dL (ref 65–99)
Potassium: 4.6 mmol/L (ref 3.5–5.2)
Sodium: 143 mmol/L (ref 134–144)
TOTAL PROTEIN: 7.9 g/dL (ref 6.0–8.5)

## 2018-06-12 LAB — HGB A1C W/O EAG: Hgb A1c MFr Bld: 7.5 % — ABNORMAL HIGH (ref 4.8–5.6)

## 2018-06-12 LAB — SPECIMEN STATUS REPORT

## 2018-06-16 ENCOUNTER — Other Ambulatory Visit: Payer: Self-pay

## 2018-06-18 ENCOUNTER — Encounter: Payer: Self-pay | Admitting: "Endocrinology

## 2018-06-18 ENCOUNTER — Ambulatory Visit (INDEPENDENT_AMBULATORY_CARE_PROVIDER_SITE_OTHER): Payer: Medicare HMO | Admitting: "Endocrinology

## 2018-06-18 DIAGNOSIS — E1159 Type 2 diabetes mellitus with other circulatory complications: Secondary | ICD-10-CM | POA: Diagnosis not present

## 2018-06-18 DIAGNOSIS — E782 Mixed hyperlipidemia: Secondary | ICD-10-CM | POA: Diagnosis not present

## 2018-06-18 MED ORDER — INSULIN DEGLUDEC 200 UNIT/ML ~~LOC~~ SOPN: 60.0000 [IU] | PEN_INJECTOR | Freq: Every day | SUBCUTANEOUS | 2 refills | Status: DC

## 2018-06-18 MED ORDER — METFORMIN HCL 500 MG PO TABS: 1000.0000 mg | ORAL_TABLET | Freq: Two times a day (BID) | ORAL | 3 refills | Status: DC

## 2018-06-18 NOTE — Progress Notes (Signed)
06/18/2018, 9:33 AM                                                    Endocrinology Telephone Visit Follow up Note -During COVID -19 Pandemic   Subjective:    Patient ID: John [name], male    DOB: 12/27/1945.  John [name] is engaged in a telephone visit for the management of currently uncontrolled, complicated type 2 diabetes, hyperlipidemia.     PMD:  Mikey Kirschner, MD.   Past Medical History:  Diagnosis Date  . Atrial flutter ([facility]) 12/2010   Admitted with symptomatic bradycardia (HR 40s), atrial flutter with slow ventricular response 12/2010 + volume overload; AV nodal agents d/c'd and Pradaxa started; RFA in 01/2011  . CHF (congestive heart failure) ([place])   . Chronic combined systolic and diastolic heart failure (HCC)    echo 01/06/11: mild LVH, EF 65-70%, mod to severe LAE, mild RVE, mild RAE, PASP 32;   TEE 10/12: EF 45-50%   . Class 2 severe obesity due to excess calories with serious comorbidity and body mass index (BMI) of 37.0 to 37.9 in adult ([facility]) 07/17/2017  . Diabetes mellitus    non insulin dependant  . Hyperlipidemia   . Hypertension   . Osteoarthritis   . Presence of permanent cardiac pacemaker   . PSVT (paroxysmal supraventricular tachycardia) (HCC)    Possibly atrial flutter   Past Surgical History:  Procedure Laterality Date  . ATRIAL FLUTTER ABLATION N/A 02/07/2011   Procedure: ATRIAL FLUTTER ABLATION;  Surgeon: Evans Lance, MD;  Location: [facility] CATH LAB;  Service: Cardiovascular;  Laterality: N/A;  . CARDIAC ELECTROPHYSIOLOGY STUDY AND ABLATION  02/07/11  . CARDIOVERSION N/A 03/23/2016   Procedure: CARDIOVERSION;  Surgeon: Evans Lance, MD;  Location: [facility];  Service: Cardiovascular;  Laterality: N/A;  . COLONOSCOPY N/A 04/17/2017   Procedure: COLONOSCOPY;  Surgeon: Rogene Houston, MD;  [facility];  Service: Endoscopy;  Laterality:  N/A;  830  . EP IMPLANTABLE DEVICE N/A 08/29/2015   Procedure: Pacemaker Implant;  Surgeon: Evans Lance, MD;  Location: [place] CV LAB;  Service: Cardiovascular;  Laterality: N/A;  . EP IMPLANTABLE DEVICE N/A 12/07/2015   Procedure: PPM Lead Revision/Repair;  Surgeon: Evans Lance, MD;  Location: [place] CV LAB;  Service: Cardiovascular;  Laterality: N/A;  . KNEE ARTHROSCOPY  2005   left  . LUMBAR SPINE SURGERY     "I've had 6 ORs 1972 thru 2004"  . POLYPECTOMY  04/17/2017   Procedure: POLYPECTOMY;  Surgeon: Rogene Houston, MD;  Location: [facility];  Service: Endoscopy;;  transverse colon x3;   Social History   Socioeconomic History  . Marital status: Married    Spouse name: Not on file  . Number of children: Not on file  . Years of education: Not on file  . Highest education level: Not on file  Occupational History  .  Occupation: Immunologist: RETIRED  Social Needs  . Financial resource strain: Not on file  . Food insecurity:    Worry: Not on file    Inability: Not on file  . Transportation needs:    Medical: Not on file    Non-medical: Not on file  Tobacco Use  . Smoking status: Former Smoker    Packs/day: 1.00    Years: 10.00    Pack years: 10.00    Types: Cigarettes    Last attempt to quit: 03/26/1986    Years since quitting: 32.2  . Smokeless tobacco: Never Used  . Tobacco comment: "stopped cigarette  smoking 1988"  Substance and Sexual Activity  . Alcohol use: Yes    Alcohol/week: 0.0 standard drinks    Comment: "quit alcohol ~ 2007"  . Drug use: No  . Sexual activity: Yes    Partners: Female  Lifestyle  . Physical activity:    Days per week: Not on file    Minutes per session: Not on file  . Stress: Not on file  Relationships  . Social connections:    Talks on phone: Not on file    Gets together: Not on file    Attends religious service: Not on file    Active member of club or organization: Not on file    Attends  meetings of clubs or organizations: Not on file    Relationship status: Not on file  Other Topics Concern  . Not on file  Social History Narrative  . Not on file   Outpatient Encounter Medications as of 06/18/2018  Medication Sig  . amLODipine (NORVASC) 10 MG tablet Take 1 tablet (10 mg total) by mouth daily. One daily  . benazepril (LOTENSIN) 40 MG tablet Take 1 tablet (40 mg total) by mouth daily.  . benzonatate (TESSALON PERLES) 100 MG capsule Take 1 capsule (100 mg total) by mouth every 6 (six) hours as needed for cough.  . carvedilol (COREG) 25 MG tablet TAKE 1 TABLET (25 MG TOTAL) BY MOUTH 2 (TWO) TIMES DAILY WITH A MEAL.  Marland Kitchen ELIQUIS 5 MG TABS tablet TAKE 1 TABLET TWICE DAILY  . glucose blood test strip 1 each by Other route as needed. Use as instructed bid  . Insulin Degludec (TRESIBA FLEXTOUCH) 200 UNIT/ML SOPN Inject 60 Units into the skin at bedtime.  . Insulin Pen Needle 31G X 8 MM MISC USE EVERY DAY AS DIRECTED BY PHYSICIAN  . Magnesium 400 MG CAPS Take 400 mg by mouth daily.   . metFORMIN (GLUCOPHAGE) 500 MG tablet Take 2 tablets (1,000 mg total) by mouth 2 (two) times daily with a meal.  . Multiple Vitamins-Minerals (MULTIVITAMIN WITH MINERALS) tablet Take 1 tablet by mouth daily.  Marland Kitchen Resveratrol 100 MG CAPS Take 100 mg by mouth daily.   . rosuvastatin (CRESTOR) 20 MG tablet Take 1 tablet (20 mg total) by mouth daily.  . tamsulosin (FLOMAX) 0.4 MG CAPS capsule Take 1 capsule (0.4 mg total) by mouth at bedtime.  . torsemide (DEMADEX) 20 MG tablet Take 1 tablet (20 mg total) by mouth 2 (two) times daily.  . TRULICITY 1.5 ES/9.2ZR SOPN INJECT  1.5MG (CONTENTS OF 1 PEN)  INTO  THE  SKIN ONE TIME WEEKLY (Patient taking differently: Inject 1.5 mg as directed every Wednesday. )  . [DISCONTINUED] hydrALAZINE (APRESOLINE) 25 MG tablet Take 1 tablet (25 mg total) by mouth 3 (three) times daily.  . [DISCONTINUED] metFORMIN (GLUCOPHAGE) 1000 MG tablet TAKE 1  TABLET TWICE DAILY WITH MEALS  .  [DISCONTINUED] TRESIBA FLEXTOUCH 200 UNIT/ML SOPN INJECT 70 UNITS SUBCUTANEOUSLY AT BEDTIME   No facility-administered encounter medications on file as of 06/18/2018.     ALLERGIES: No Known Allergies  VACCINATION STATUS: Immunization History  Administered Date(s) Administered  . Influenza,inj,Quad PF,6+ Mos 11/24/2014, 11/29/2015, 12/11/2016, 12/25/2017  . Influenza-Unspecified 11/24/2013  . Pneumococcal Conjugate-13 11/19/2013  . Pneumococcal Polysaccharide-23 11/29/2015  . Pneumococcal-Unspecified 03/24/2009  . Td 06/23/2010    Diabetes  He presents for his follow-up (Telephone visit due to coronavirus pandemic) diabetic visit. He has type 2 diabetes mellitus. Onset time: He was diagnosed at approximate age of 24 years. His disease course has been stable. There are no hypoglycemic associated symptoms. Pertinent negatives for hypoglycemia include no confusion, pallor or seizures. There are no diabetic associated symptoms. Pertinent negatives for diabetes include no fatigue, no polydipsia, no polyphagia, no polyuria and no weakness. There are no hypoglycemic complications. Symptoms are stable. Diabetic complications include heart disease. Risk factors for coronary artery disease include diabetes mellitus, dyslipidemia, family history, obesity, male sex, hypertension, sedentary lifestyle and tobacco exposure. Current diabetic treatment includes insulin injections. He is following a generally unhealthy diet. When asked about meal planning, he reported none. He has not had a previous visit with a dietitian. He never participates in exercise. His home blood glucose trend is decreasing steadily. His breakfast blood glucose range is generally 130-140 mg/dl. His bedtime blood glucose range is generally 140-180 mg/dl. His overall blood glucose range is 140-180 mg/dl. An ACE inhibitor/angiotensin II receptor blocker is being taken. He does not see a podiatrist.Eye exam is current.  Hyperlipidemia  This  is a chronic problem. The current episode started more than 1 year ago. The problem is controlled. Exacerbating diseases include diabetes and obesity. Pertinent negatives include no myalgias. Current antihyperlipidemic treatment includes statins. Risk factors for coronary artery disease include diabetes mellitus, dyslipidemia, family history, obesity, male sex, hypertension and a sedentary lifestyle.    Objective:      CMP     Component Value Date/Time   NA 143 06/11/2018 0844   K 4.6 06/11/2018 0844   CL 102 06/11/2018 0844   CO2 24 06/11/2018 0844   GLUCOSE 95 06/11/2018 0844   GLUCOSE 67 (L) 03/03/2018 0455   BUN 21 06/11/2018 0844   CREATININE 1.31 (H) 06/11/2018 0844   CREATININE 1.32 (H) 03/07/2016 1043   CALCIUM 9.3 06/11/2018 0844   PROT 7.9 06/11/2018 0844   ALBUMIN 4.3 06/11/2018 0844   AST 37 06/11/2018 0844   ALT 26 06/11/2018 0844   ALKPHOS 144 (H) 06/11/2018 0844   BILITOT 1.7 (H) 06/11/2018 0844   GFRNONAA 54 (L) 06/11/2018 0844   GFRAA 62 06/11/2018 0844     Diabetic Labs (most recent): Lab Results  Component Value Date   HGBA1C 7.5 (H) 06/11/2018   HGBA1C 7.4 (H) 02/10/2018   HGBA1C 7.8 (H) 10/09/2017     Lipid Panel     Component Value Date/Time   CHOL 142 01/17/2018 0835   TRIG 90 01/17/2018 0835   HDL 53 01/17/2018 0835   CHOLHDL 2.7 01/17/2018 0835   CHOLHDL 3.2 11/16/2013 0702   VLDL 45 (H) 11/16/2013 0702   LDLCALC 71 01/17/2018 0835      Lab Results  Component Value Date   TSH 1.940 07/10/2017   TSH 1.563 08/25/2015   TSH 1.206 01/05/2011   FREET4 1.12 07/10/2017        Assessment & Plan:  1. DM type 2 causing vascular disease ([place])  - John [name] has currently uncontrolled symptomatic type 2 DM since 73 years of age. -His previsit labs show A1c of 7.5%, overall improving from 8.6%.   -He does not report any major hypoglycemia, anticipates busy season with a lot of yard work planned for the spring.  Recent labs  reviewed.  -his diabetes is complicated by obesity/sedentary life , CKD, congestive heart failure/bradyarrhythmia and John [name] remains at a high risk for more acute and chronic complications which include CAD, CVA, CKD, retinopathy, and neuropathy. These are all discussed in detail with the patient.   - I encouraged him to switch to  unprocessed or minimally processed complex starch and increased protein intake (animal or plant source), fruits, and vegetables.  - he is advised to stick to a routine mealtimes to eat 3 meals  a day and avoid unnecessary snacks ( to snack only to correct hypoglycemia).    - I have approached him with the following individualized plan to manage diabetes and patient agrees:   -Based on his current glycemic response, he will not require prandial insulin for now.   -Due to relatively tight fasting blood glucose profile, he is advised to lower her Tresiba to 60 units nightly,  associated with strict monitoring of blood glucose 2 times a day-daily before breakfast and at bedtime. - Patient is warned not to take insulin without proper monitoring per orders.  -Patient is encouraged to call clinic for blood glucose levels less than 70 or above 200 mg /dl. -He is advised to lower her metformin to 500 mg p.o. twice daily--after breakfast and after supper.    -He is advised to continue Trulicity 1.5 mg subcutaneously weekly  , side effects and precautions discussed with him.     2) Lipids/HPL:    His recent lipid panel shows controlled LDL at 53.  He is advised to continue Crestor 20 mg p.o. Nightly.  - I advised patient to maintain close follow up with Mikey Kirschner, MD for primary care needs.  - Patient Care Time Today:  25 min, of which >50% was spent in reviewing his  current and  previous labs/studies, blood glucose readings over the phone, previous treatments, and medications doses and developing a plan for long-term care based on the latest  recommendations for standards of care.  John [name] participated in the discussions, expressed understanding, and voiced agreement with the above plans.  All questions were answered to his satisfaction. he is encouraged to contact clinic should he have any questions or concerns prior to his return visit.   Follow up plan: - Return in about 3 months (around 09/18/2018) for Follow up with Pre-visit Labs, Meter, and Logs.  Glade Lloyd, MD [facility] [address] [place], [place] [address] Phone: [phone]  Fax: [phone]    06/18/2018, 9:33 AM  This note was partially dictated with voice recognition software. Similar sounding words can be transcribed inadequately or may not  be corrected upon review.

## 2018-06-30 ENCOUNTER — Other Ambulatory Visit: Payer: Self-pay

## 2018-06-30 ENCOUNTER — Ambulatory Visit: Payer: Medicare HMO | Admitting: Family Medicine

## 2018-06-30 ENCOUNTER — Ambulatory Visit (INDEPENDENT_AMBULATORY_CARE_PROVIDER_SITE_OTHER): Payer: Medicare HMO | Admitting: Family Medicine

## 2018-06-30 DIAGNOSIS — I10 Essential (primary) hypertension: Secondary | ICD-10-CM

## 2018-06-30 DIAGNOSIS — E785 Hyperlipidemia, unspecified: Secondary | ICD-10-CM

## 2018-06-30 DIAGNOSIS — E119 Type 2 diabetes mellitus without complications: Secondary | ICD-10-CM | POA: Diagnosis not present

## 2018-06-30 MED ORDER — TAMSULOSIN HCL 0.4 MG PO CAPS: 0.4000 mg | ORAL_CAPSULE | Freq: Every day | ORAL | 1 refills | Status: DC

## 2018-06-30 MED ORDER — ROSUVASTATIN CALCIUM 20 MG PO TABS: 20.0000 mg | ORAL_TABLET | Freq: Every day | ORAL | 1 refills | Status: DC

## 2018-06-30 MED ORDER — AMLODIPINE BESYLATE 10 MG PO TABS: 10.0000 mg | ORAL_TABLET | Freq: Every day | ORAL | 1 refills | Status: DC

## 2018-06-30 MED ORDER — BENAZEPRIL HCL 40 MG PO TABS: 40.0000 mg | ORAL_TABLET | Freq: Every day | ORAL | 1 refills | Status: DC

## 2018-06-30 NOTE — Progress Notes (Signed)
   Subjective:    Patient ID: GRAYSON PFEFFERLE, male    DOB: Nov 29, 1945, 73 y.o.   MRN: [id number] Video plus audio visit Hyperlipidemia  This is a chronic problem. There are no compliance problems (takes meds every day, eats healthy, will start exercising now that weather is nice).    Sees dr nida for diabetes.   Pt states no concerns today.   Virtual Visit via Telephone Note  I connected with [name] on 06/30/18 at 11:00 AM EDT by telephone and verified that I am speaking with the correct person using two identifiers.   I discussed the limitations, risks, security and privacy concerns of performing an evaluation and management service by telephone and the availability of in person appointments. I also discussed with the patient that there may be a patient responsible charge related to this service. The patient expressed understanding and agreed to proceed.   History of Present Illness:    Observations/Objective:   Assessment and Plan:   Follow Up Instructions:    I discussed the assessment and treatment plan with the patient. The patient was provided an opportunity to ask questions and all were answered. The patient agreed with the plan and demonstrated an understanding of the instructions.   The patient was advised to call back or seek an in-person evaluation if the symptoms worsen or if the condition fails to improve as anticipated.  I provided 25 minutes of non-face-to-face time during this encounter.  Blood pressure medicine and blood pressure levels reviewed today with patient. Compliant with blood pressure medicine. States does not miss a dose. No obvious side effects. Blood pressure generally good when checked elsewhere. Watching salt intake.   Patient continues to take lipid medication regularly. No obvious side effects from it. Generally does not miss a dose. Prior blood work results are reviewed with patient. Patient continues to work on fat intake in diet     Review of Systems No headache, no major weight loss or weight gain, no chest pain no back pain abdominal pain no change in bowel habits complete ROS otherwise negative     Objective:   Physical Exam   Virtual exam     Assessment & Plan:  Impression hypertension blood pressure good when checked elsewhere compliance discussed medications refilled diet discussed.  2.  Hyperlipidemia.  Prior blood work reviewed.  Compliance discussed diet discussed  3.  Coronavirus questions multiple answered and discussed  4.  Prostate hypertrophy good control on Flomax definitely helping  Follow-up in 6 months diet excise discussed medications refilled

## 2018-07-07 ENCOUNTER — Encounter: Payer: Self-pay | Admitting: Family Medicine

## 2018-07-10 ENCOUNTER — Other Ambulatory Visit: Payer: Self-pay | Admitting: "Endocrinology

## 2018-07-16 ENCOUNTER — Other Ambulatory Visit: Payer: Self-pay

## 2018-07-16 ENCOUNTER — Ambulatory Visit (INDEPENDENT_AMBULATORY_CARE_PROVIDER_SITE_OTHER): Payer: Medicare HMO | Admitting: *Deleted

## 2018-07-16 DIAGNOSIS — I480 Paroxysmal atrial fibrillation: Secondary | ICD-10-CM | POA: Diagnosis not present

## 2018-07-16 DIAGNOSIS — I442 Atrioventricular block, complete: Secondary | ICD-10-CM

## 2018-07-16 LAB — CUP PACEART REMOTE DEVICE CHECK
Battery Impedance: 135 Ohm
Battery Remaining Longevity: 114 mo
Battery Voltage: 2.79 V
Brady Statistic AP VP Percent: 21 %
Brady Statistic AP VS Percent: 0 %
Brady Statistic AS VP Percent: 38 %
Brady Statistic AS VS Percent: 41 %
Date Time Interrogation Session: 20200422114043
Implantable Lead Implant Date: 20170605
Implantable Lead Implant Date: 20170605
Implantable Lead Location: 753859
Implantable Lead Location: 753860
Implantable Lead Model: 5076
Implantable Lead Model: 5076
Implantable Pulse Generator Implant Date: 20170605
Lead Channel Impedance Value: 381 Ohm
Lead Channel Impedance Value: 494 Ohm
Lead Channel Pacing Threshold Amplitude: 0.625 V
Lead Channel Pacing Threshold Amplitude: 0.875 V
Lead Channel Pacing Threshold Pulse Width: 0.4 ms
Lead Channel Pacing Threshold Pulse Width: 0.4 ms
Lead Channel Setting Pacing Amplitude: 2 V
Lead Channel Setting Pacing Amplitude: 2.5 V
Lead Channel Setting Pacing Pulse Width: 0.4 ms
Lead Channel Setting Sensing Sensitivity: 4 mV

## 2018-07-17 ENCOUNTER — Telehealth: Payer: Self-pay | Admitting: Adult Health

## 2018-07-17 ENCOUNTER — Telehealth: Payer: Self-pay

## 2018-07-17 DIAGNOSIS — Z79899 Other long term (current) drug therapy: Secondary | ICD-10-CM

## 2018-07-17 DIAGNOSIS — I5031 Acute diastolic (congestive) heart failure: Secondary | ICD-10-CM

## 2018-07-17 NOTE — Telephone Encounter (Signed)
S/w pt, Pt informed of providers result & recommendations. Pt verbalized understanding. No further questions .

## 2018-07-17 NOTE — Telephone Encounter (Signed)
Pt c/o swelling: STAT is pt has developed SOB within 24 hours  1) How much weight have you gained and in what time span? She did not know  2) If swelling, where is the swelling located? Stomach, legs and left arm  3) Are you currently taking a fluid pill? yes  4) Are you currently SOB? Not at this time, but he is short of breath  5) Do you have a log of your daily weights (if so, list)?  no  6) Have you gained 3 pounds in a day or 5 pounds in a week?   7) Have you traveled recently?no- wife wants pt to be seen

## 2018-07-17 NOTE — Telephone Encounter (Signed)
Patient can go up on the Torsemide to 40 mg BID for 3 days. He will need to have labs drawn to check BMET and BNP today or tomorrow. Follow up appointment with Dr. Bronson Ing early next week.  He needs to get a scale, avoid salt. Use [facility] NP team to have labs drawn please.

## 2018-07-17 NOTE — Telephone Encounter (Signed)
Spoke with the pt and he is reporting increased bilateral leg edema up to his knees... he can still wear his normal shoes but they are very tight. He says his belly seems more distended than usual and his hands are puffy and it is making his left hand numb and tingling.. he denies chest pain, dizziness, but increased SOB with exertion.. more comfortable at rest. He says all of this has been worsening for about the last week.  He has been taking his Torsemide 30m bid, he is unsure of his BP. He saw his PMD Dr. LWolfgang Phoenixon 06/30/18 and says his BP was "normal" there.   I advised pt that I will forward to KJory SimsNP for her review and recommendations..Marland Kitchen

## 2018-07-17 NOTE — Telephone Encounter (Signed)
[facility] Visit Request  Agency Requested:  [facility] Contact:  Darnell Level, NP Phone #:  [phone] Fax #:  [phone]  Patient Information: Name:  John [name]  DOB:  [birth year]  MRN:  [id number]  Address:   [address] [place] [address]  Phone Numbers:   Home Phone [phone]  Mobile [phone]     Requesting Provider:   Jory Sims, NP   Diagnosis:   Medication management, Acute diastolic CHF  Reason for Visit:   Lab draw-BMET AND BNP  Services Requested:  Labs:  BMET AND BNP  # of Visits Needed/Frequency per Week: 1-THIS WEEK 07-18-2018 OR -25-2020

## 2018-07-18 NOTE — Telephone Encounter (Signed)
Sent low sodium(DASH)diet via [facility]

## 2018-07-20 ENCOUNTER — Other Ambulatory Visit: Payer: Self-pay | Admitting: Adult Health

## 2018-07-21 NOTE — Telephone Encounter (Signed)
Torsemide 20 mg refilled.

## 2018-07-22 ENCOUNTER — Other Ambulatory Visit: Payer: Self-pay | Admitting: Cardiovascular Disease

## 2018-07-22 ENCOUNTER — Telehealth (INDEPENDENT_AMBULATORY_CARE_PROVIDER_SITE_OTHER): Payer: Medicare HMO | Admitting: Cardiovascular Disease

## 2018-07-22 ENCOUNTER — Other Ambulatory Visit: Payer: Self-pay | Admitting: Adult Health

## 2018-07-22 ENCOUNTER — Encounter: Payer: Self-pay | Admitting: Cardiovascular Disease

## 2018-07-22 VITALS — Ht 71.0 in | Wt 272.0 lb

## 2018-07-22 DIAGNOSIS — Z950 Presence of cardiac pacemaker: Secondary | ICD-10-CM

## 2018-07-22 DIAGNOSIS — Z79899 Other long term (current) drug therapy: Secondary | ICD-10-CM | POA: Diagnosis not present

## 2018-07-22 DIAGNOSIS — I442 Atrioventricular block, complete: Secondary | ICD-10-CM

## 2018-07-22 DIAGNOSIS — I10 Essential (primary) hypertension: Secondary | ICD-10-CM

## 2018-07-22 DIAGNOSIS — I5023 Acute on chronic systolic (congestive) heart failure: Secondary | ICD-10-CM | POA: Diagnosis not present

## 2018-07-22 DIAGNOSIS — E7800 Pure hypercholesterolemia, unspecified: Secondary | ICD-10-CM

## 2018-07-22 DIAGNOSIS — I350 Nonrheumatic aortic (valve) stenosis: Secondary | ICD-10-CM

## 2018-07-22 DIAGNOSIS — I4819 Other persistent atrial fibrillation: Secondary | ICD-10-CM

## 2018-07-22 DIAGNOSIS — I5031 Acute diastolic (congestive) heart failure: Secondary | ICD-10-CM | POA: Diagnosis not present

## 2018-07-22 NOTE — Progress Notes (Signed)
Virtual Visit via Telephone Note   This visit type was conducted due to national recommendations for restrictions regarding the COVID-19 Pandemic (e.g. social distancing) in an effort to limit this patient's exposure and mitigate transmission in our community.  Due to his co-morbid illnesses, this patient is at least at moderate risk for complications without adequate follow up.  This format is felt to be most appropriate for this patient at this time.  The patient did not have access to video technology/had technical difficulties with video requiring transitioning to audio format only (telephone).  All issues noted in this document were discussed and addressed.  No physical exam could be performed with this format.  Please refer to the patient's chart for his  consent to telehealth for [facility].   Evaluation Performed:  Follow-up visit  Date:  07/22/2018   ID:  John [name], DOB 03/22/1946, MRN [id number]  Patient Location: Home Provider Location: Home  PCP:  Mikey Kirschner, MD  Cardiologist:  [name], MD  Electrophysiologist:  None   Chief Complaint: CHF  History of Present Illness:    John [name] is a 73 y.o. male with a history of high-grade heart block status post permanent pacemaker insertion, atrial flutter status post ablation, andparoxysmal atrial fibrillation status post cardioversionon 03/23/16. He also has chronic systolic heart failure and aortic stenosis.  I saw him last in 12/2017. He saw K. Lawrence DNP most recently on 05/05/18.  He was switched to torsemide 20 mg bid and has had a temporary increase to 40 mg bid within the last several days due to bilateral leg edema.  Has left arm and hand swelling x past few days. Doesn't add salt to food. Does not eat fast food. Wife prepares food. Home nurse coming today to do blood work. Leg swelling is going down.   SCr 1.31 (1.12 on 05/02/18) and A1C 7.5% on 06/11/18.  The patient does not have  symptoms concerning for COVID-19 infection (fever, chills, cough, or new shortness of breath).    Past Medical History:  Diagnosis Date  . Atrial flutter ([place]) 12/2010   Admitted with symptomatic bradycardia (HR 40s), atrial flutter with slow ventricular response 12/2010 + volume overload; AV nodal agents d/c'd and Pradaxa started; RFA in 01/2011  . CHF (congestive heart failure) ([place])   . Chronic combined systolic and diastolic heart failure (HCC)    echo 01/06/11: mild LVH, EF 65-70%, mod to severe LAE, mild RVE, mild RAE, PASP 32;   TEE 10/12: EF 45-50%   . Class 2 severe obesity due to excess calories with serious comorbidity and body mass index (BMI) of 37.0 to 37.9 in adult ([place]) 07/17/2017  . Diabetes mellitus    non insulin dependant  . Hyperlipidemia   . Hypertension   . Osteoarthritis   . Presence of permanent cardiac pacemaker   . PSVT (paroxysmal supraventricular tachycardia) (HCC)    Possibly atrial flutter   Past Surgical History:  Procedure Laterality Date  . ATRIAL FLUTTER ABLATION N/A 02/07/2011   Procedure: ATRIAL FLUTTER ABLATION;  Surgeon: Evans Lance, MD;  Location: [facility] CATH LAB;  Service: Cardiovascular;  Laterality: N/A;  . CARDIAC ELECTROPHYSIOLOGY STUDY AND ABLATION  02/07/11  . CARDIOVERSION N/A 03/23/2016   Procedure: CARDIOVERSION;  Surgeon: Evans Lance, MD;  Location: [place];  Service: Cardiovascular;  Laterality: N/A;  . COLONOSCOPY N/A 04/17/2017   Procedure: COLONOSCOPY;  Surgeon: Rogene Houston, MD;  Location[facility];  Service: Endoscopy;  Laterality: N/A;  830  . EP IMPLANTABLE DEVICE N/A 08/29/2015   Procedure: Pacemaker Implant;  Surgeon: Evans Lance, MD;  Location: [place] CV LAB;  Service: Cardiovascular;  Laterality: N/A;  . EP IMPLANTABLE DEVICE N/A 12/07/2015   Procedure: PPM Lead Revision/Repair;  Surgeon: Evans Lance, MD;  Location: [place] CV LAB;  Service: Cardiovascular;  Laterality: N/A;  . KNEE ARTHROSCOPY   2005   left  . LUMBAR SPINE SURGERY     "I've had 6 ORs 1972 thru 2004"  . POLYPECTOMY  04/17/2017   Procedure: POLYPECTOMY;  Surgeon: Rogene Houston, MD;  Location: [facility];  Service: Endoscopy;;  transverse colon x3;     Current Meds  Medication Sig  . amLODipine (NORVASC) 10 MG tablet Take 1 tablet (10 mg total) by mouth daily. One daily  . benazepril (LOTENSIN) 40 MG tablet Take 1 tablet (40 mg total) by mouth daily.  . benzonatate (TESSALON PERLES) 100 MG capsule Take 1 capsule (100 mg total) by mouth every 6 (six) hours as needed for cough.  . carvedilol (COREG) 25 MG tablet TAKE 1 TABLET (25 MG TOTAL) BY MOUTH 2 (TWO) TIMES DAILY WITH A MEAL.  Marland Kitchen ELIQUIS 5 MG TABS tablet TAKE 1 TABLET TWICE DAILY  . glucose blood test strip 1 each by Other route as needed. Use as instructed bid  . Insulin Degludec (TRESIBA FLEXTOUCH) 200 UNIT/ML SOPN Inject 60 Units into the skin at bedtime.  . Insulin Pen Needle 31G X 8 MM MISC USE EVERY DAY AS DIRECTED BY PHYSICIAN  . Magnesium 400 MG CAPS Take 400 mg by mouth daily.   . metFORMIN (GLUCOPHAGE) 500 MG tablet Take 2 tablets (1,000 mg total) by mouth 2 (two) times daily with a meal.  . Multiple Vitamins-Minerals (MULTIVITAMIN WITH MINERALS) tablet Take 1 tablet by mouth daily.  Marland Kitchen Resveratrol 100 MG CAPS Take 100 mg by mouth daily.   . rosuvastatin (CRESTOR) 20 MG tablet Take 1 tablet (20 mg total) by mouth daily.  . tamsulosin (FLOMAX) 0.4 MG CAPS capsule Take 1 capsule (0.4 mg total) by mouth at bedtime.  . torsemide (DEMADEX) 20 MG tablet Take 40 mg by mouth 2 (two) times daily.  . TRULICITY 1.5 PQ/9.8YM SOPN INJECT  1.5MG (CONTENTS OF 1 PEN)  INTO  THE  SKIN ONE TIME WEEKLY  . [DISCONTINUED] torsemide (DEMADEX) 20 MG tablet TAKE 1 TABLET BY MOUTH TWICE A DAY (Patient taking differently: Take 40 mg by mouth 2 (two) times daily. )     Allergies:   Patient has no known allergies.   Social History   Tobacco Use  . Smoking status: Former  Smoker    Packs/day: 1.00    Years: 10.00    Pack years: 10.00    Types: Cigarettes    Last attempt to quit: 03/26/1986    Years since quitting: 32.3  . Smokeless tobacco: Never Used  . Tobacco comment: "stopped cigarette  smoking 1988"  Substance Use Topics  . Alcohol use: Yes    Alcohol/week: 0.0 standard drinks    Comment: "quit alcohol ~ 2007"  . Drug use: No     Family Hx: The patient's family history includes Heart attack in his brother, father, and mother; Hypertension in his father and mother. There is no history of Colon cancer.  ROS:   Please see the history of present illness.     All other systems reviewed and are negative.   Prior CV studies:  The following studies were reviewed today:  Echo 01/27/18:  Study Conclusions  - Left ventricle: The cavity size was normal. Wall thickness was   increased in a pattern of mild LVH. Systolic function was mildly   reduced. The estimated ejection fraction was 45%. The study is   not technically sufficient to allow evaluation of LV diastolic   function. - Regional wall motion abnormality: Hypokinesis of the apical   anterior, apical inferior, apical septal, and apical myocardium. - Aortic valve: There was mild stenosis. Moderate calcification of   noncoronary cusp in particular. Valve area (VTI): 1.56 cm^2.   Valve area (Vmax): 1.49 cm^2. Valve area (Vmean): 1.53 cm^2. - Mitral valve: There was mild to moderate regurgitation. - Left atrium: The atrium was severely dilated. - Right ventricle: Pacer wire or catheter noted in right ventricle. - Right atrium: Pacer wire or catheter noted in right atrium. - Tricuspid valve: There was mild regurgitation. - Pulmonary arteries: PA peak pressure: 30 mm Hg (S). - Systemic veins: The IVC is dilated with normal respiratory   variation. Estimated right atrial pressure is 8 mmHg.  Labs/Other Tests and Data Reviewed:    EKG:  No ECG reviewed.  Recent Labs: 03/02/2018: B  Natriuretic Peptide 917.2; Hemoglobin 12.4; Platelets 133 06/11/2018: ALT 26; BUN 21; Creatinine, Ser 1.31; Potassium 4.6; Sodium 143   Recent Lipid Panel Lab Results  Component Value Date/Time   CHOL 142 01/17/2018 08:35 AM   TRIG 90 01/17/2018 08:35 AM   HDL 53 01/17/2018 08:35 AM   CHOLHDL 2.7 01/17/2018 08:35 AM   CHOLHDL 3.2 11/16/2013 07:02 AM   LDLCALC 71 01/17/2018 08:35 AM    Wt Readings from Last 3 Encounters:  07/22/18 272 lb (123.4 kg)  05/05/18 268 lb 9.6 oz (121.8 kg)  04/28/18 268 lb (121.6 kg)     Objective:    Vital Signs:  Ht 5' 11"  (1.803 m)   Wt 272 lb (123.4 kg)   BMI 37.94 kg/m    VITAL SIGNS:  reviewed  ASSESSMENT & PLAN:    1. Persistent atrial fibrillation: Continue carvedilol and Eliquis. Symptomaticallystable s/p DCCV in 02/2016.  2. Acute on chronic systolic heart failure/cardiomyopathy: LVEF 45% on 01/27/18 by echo. Currently on torsemide 40 mg bid but usually takes 20 mg bid (did better on torsemide when compared to Lasix). Continue carvedilol and benazepril. Labs to be drawn by visiting nurse today. Usually weighs 260-262 without clothing, today 272 lbs. Continue torsemide 40 mg bid for now. Will reassess in one week.  3. Pacemaker s/p RA/RV lead revision 11/2015: Stable. Normal device function. Continue follow up with EP.  4. Hypertension:No changes.  5. Hyperlipidemia: Continue statin.  6. Aortic stenosis: Mild in 01/2018. Stable.     COVID-19 Education: The signs and symptoms of COVID-19 were discussed with the patient and how to seek care for testing (follow up with PCP or arrange E-visit).  The importance of social distancing was discussed today.  Time:   Today, I have spent 25 minutes with the patient with telehealth technology discussing the above problems.     Medication Adjustments/Labs and Tests Ordered: Current medicines are reviewed at length with the patient today.  Concerns regarding medicines are outlined above.    Tests Ordered: No orders of the defined types were placed in this encounter.   Medication Changes: No orders of the defined types were placed in this encounter.   Disposition:  Follow up in 2 week(s)  Signed, [name], MD  07/22/2018  9:31 AM    [facility]

## 2018-07-22 NOTE — Patient Instructions (Signed)
Medication Instructions:   Your physician recommends that you continue on your current medications as directed. Please refer to the Current Medication list given to you today.  Labwork:  We will obtain your results from today's lab draw  Testing/Procedures:  NONE  Follow-Up:  Your physician recommends that you schedule a follow-up appointment in: 2 weeks with an extender.  Any Other Special Instructions Will Be Listed Below (If Applicable).  If you need a refill on your cardiac medications before your next appointment, please call your pharmacy.

## 2018-07-22 NOTE — Addendum Note (Signed)
Addended byKathlen Mody, Nicki Reaper T on: 07/22/2018 12:46 PM   Modules accepted: Orders

## 2018-07-23 ENCOUNTER — Telehealth: Payer: Self-pay | Admitting: *Deleted

## 2018-07-23 ENCOUNTER — Other Ambulatory Visit: Payer: Self-pay | Admitting: Adult Health

## 2018-07-23 DIAGNOSIS — I509 Heart failure, unspecified: Secondary | ICD-10-CM | POA: Diagnosis not present

## 2018-07-23 LAB — BASIC METABOLIC PANEL
BUN/Creatinine Ratio: 20
BUN: 25 mg/dL
CO2: 22 mmol/L (ref 20–29)
Calcium: 8.9 mg/dL
Chloride: 102 mmol/L (ref 96–106)
Creatinine, Ser: 1.28 mg/dL
Glucose: 124 mg/dL — ABNORMAL HIGH (ref 65–99)
Potassium: 3.9 mmol/L (ref 3.5–5.2)
Sodium: 141 mmol/L (ref 134–144)

## 2018-07-23 LAB — SPECIMEN STATUS REPORT

## 2018-07-23 NOTE — Telephone Encounter (Addendum)
   Incoming Triage Call Report from [place] Visit  Patient Name: John [name] MRN: [id number] DOB: Mar 25, 1946 Date of Home Visit: 07/23/2018 Requesting Provider: [place] Provider Name Giving Report: John Buff, NP  Patient's Vital Signs:  Yesterday:  T- 98.0, Pulse 69, Resp 18, O2 98%, BP 112/70 Today: T - 98.4, Pulse 65, Resp 18, O2 98%, BP 106/58 Height : 5'11" Weight 276 pds (both days)  Labs Drawn Today: BMET, BNP  (she had to go back a re-draw labs)  Clinical Assessment/Concerns:  Yesterday: Increased swelling to LUE & BLEE 12-14 pd wt gain since February RLE - 33.5 cm LLE - 34cm Abd - 133 cm LUE 4+ pitting BLEE 4+ pitting Diminished lung sounds, wheeze in right lung Distended abd  Today: Swelling improved, pt feels better, LUE & BLEE improved.  LUE 3+ pitting, BLEE 3+ pitting O2 98*% on exertion Wheezes improved RLE - 33 cm LLE - 34 CM ABD - 128 cm Wheezing improved  Follow-up Recommendations:  - Increase diuretics minimally since improvement since yesterday - Send John [name] (works w/ John [name] HHNP), out for [employer] rehab - feels this would benefit pt greatly - ?? Heart failure vest if pt doesn't continue to improve   NOTE: This call should be routed to referring cardiology provider. If provider is out of the office and there is an urgent question, the pre-op APP should be contacted for further recommendations.   2

## 2018-07-24 LAB — BASIC METABOLIC PANEL
BUN/Creatinine Ratio: 23 (ref 10–24)
BUN: 27 mg/dL (ref 8–27)
CO2: 22 mmol/L (ref 20–29)
Calcium: 8.8 mg/dL (ref 8.6–10.2)
Chloride: 102 mmol/L (ref 96–106)
Creatinine, Ser: 1.18 mg/dL (ref 0.76–1.27)
GFR calc Af Amer: 71 mL/min/{1.73_m2} (ref >59)
GFR calc non Af Amer: 61 mL/min/{1.73_m2} (ref >59)
Glucose: 134 mg/dL — ABNORMAL HIGH (ref 65–99)
Potassium: 3.9 mmol/L (ref 3.5–5.2)
Sodium: 141 mmol/L (ref 134–144)

## 2018-07-24 LAB — SPECIMEN STATUS REPORT

## 2018-07-24 LAB — BRAIN NATRIURETIC PEPTIDE: BNP: 1098.1 pg/mL — ABNORMAL HIGH (ref 0.0–100.0)

## 2018-07-24 NOTE — Telephone Encounter (Signed)
Dr. Bronson Ing addressed this on virtual visit on 07/22/2018. He is to continue torsemide 40 mg BID (was taking 20 mg BID), and is scheduled to be seen in 2 weeks on follow up.

## 2018-07-24 NOTE — Progress Notes (Signed)
Remote pacemaker transmission.   

## 2018-07-25 ENCOUNTER — Telehealth: Payer: Self-pay | Admitting: *Deleted

## 2018-07-25 DIAGNOSIS — I10 Essential (primary) hypertension: Secondary | ICD-10-CM

## 2018-07-25 NOTE — Telephone Encounter (Signed)
Continue 40 mg bid torsemide and repeat BMET on 5/5. Continue to monitor daily weights.

## 2018-07-25 NOTE — Telephone Encounter (Signed)
   Incoming Triage Call Report from [place] Visit  Patient Name: John [name] MRN: [id number] DOB: 09-15-1945 Date of Home Visit: 07/25/2018 Requesting Provider: Jory Sims, NP/Dr. [place] Provider Name Giving Report: Duwayne Heck, HHPT  Patient's Vital Signs: Weight - 274.2 lb (down from 276 2 days ago) O2 Sat - 97% ra BP - 113/73  Medication Reconciliation Accuracy:  Pt taking Torsemide 40 mg BID  Pertinent Physical Exam Findings:  Pt doing better today.  Lungs clear, wheezing gone  (pt stated he felt better) RLE - 31 cm LLE - 32 cm Abd - 127 cm RLE - 3+ pitting LLE - 3+ pitting O2 - 97% at rest, ambulated 269f 98% RP 5 out of 10 Pt is moving a lot better. Earlier this week pt was struggling and tight in wrist/ankle motion. Better motion noted today. Pt has been given exercise instructions to do at home.  Pt has been given BP & Wt logs to keep track with.  Clinical Concerns Identified:  Pt still edematous, but improving HHPT has one visit scheduled next week for f/u Currently no orders to draw labs at pt visit  next week - please order if needed * Please have someone call pt w/ lab result, they stated they have not been notified of result (result in EPIC)  Labs Drawn Today: None drawn Fasting blood glucose this morning 77  Follow-up Recommendations: Continued [facility]vancevisits, lab orders if needed    NOTE TO TRIAGE: This call should be routed to referring cardiology provider. If provider is out of the office and there is an urgent question, the pre-op APP should be contacted for further recommendations.

## 2018-07-25 NOTE — Telephone Encounter (Signed)
Called pt. Made him aware. He voiced understanding.

## 2018-07-25 NOTE — Telephone Encounter (Signed)
Placed lab order

## 2018-07-25 NOTE — Telephone Encounter (Signed)
Will forward to Dr. Bronson Ing to review home health notes and make any changes needed. Pts family would like a call back today informing them of any updates.

## 2018-07-30 ENCOUNTER — Telehealth: Payer: Self-pay | Admitting: *Deleted

## 2018-07-30 NOTE — Telephone Encounter (Signed)
Notes recorded by Laurine Blazer, LPN on 04/27/1171 at 56:70 AM EDT Patient notified. Copy to pmd. ------  Notes recorded by Herminio Commons, MD on 07/28/2018 at 4:48 PM EDT Good renal function. Continue current diuretic regimen.

## 2018-08-05 ENCOUNTER — Encounter: Payer: Self-pay | Admitting: Student

## 2018-08-05 ENCOUNTER — Telehealth (INDEPENDENT_AMBULATORY_CARE_PROVIDER_SITE_OTHER): Payer: Medicare HMO | Admitting: Student

## 2018-08-05 VITALS — Wt 271.0 lb

## 2018-08-05 DIAGNOSIS — I350 Nonrheumatic aortic (valve) stenosis: Secondary | ICD-10-CM

## 2018-08-05 DIAGNOSIS — E785 Hyperlipidemia, unspecified: Secondary | ICD-10-CM

## 2018-08-05 DIAGNOSIS — I442 Atrioventricular block, complete: Secondary | ICD-10-CM

## 2018-08-05 DIAGNOSIS — Z7189 Other specified counseling: Secondary | ICD-10-CM

## 2018-08-05 DIAGNOSIS — I480 Paroxysmal atrial fibrillation: Secondary | ICD-10-CM

## 2018-08-05 DIAGNOSIS — I5042 Chronic combined systolic (congestive) and diastolic (congestive) heart failure: Secondary | ICD-10-CM | POA: Diagnosis not present

## 2018-08-05 DIAGNOSIS — I10 Essential (primary) hypertension: Secondary | ICD-10-CM

## 2018-08-05 NOTE — Progress Notes (Signed)
Virtual Visit via Telephone Note   This visit type was conducted due to national recommendations for restrictions regarding the COVID-19 Pandemic (e.g. social distancing) in an effort to limit this patient's exposure and mitigate transmission in our community.  Due to his co-morbid illnesses, this patient is at least at moderate risk for complications without adequate follow up.  This format is felt to be most appropriate for this patient at this time.  The patient did not have access to video technology/had technical difficulties with video requiring transitioning to audio format only (telephone).  All issues noted in this document were discussed and addressed.  No physical exam could be performed with this format.  Please refer to the patient's chart for his  consent to telehealth for [facility].   Date:  08/05/2018   ID:  John [name], DOB 1945-04-05, MRN [id number]  Patient Location: Home Provider Location: Home  PCP:  Mikey Kirschner, MD  Cardiologist:  [name], MD  Electrophysiologist: Dr. Lovena Le  Evaluation Performed:  Follow-Up Visit  Chief Complaint: 2-week visit  History of Present Illness:    John [name] is a 73 y.o. male with past medical history of CHB (s/p PPM placement in 2017), paroxysmal atrial flutter (s/p ablation in 2012), paroxysmal atrial fibrillation (on Eliquis), chronic combined systolic and diastolic CHF (EF 57% in 2620), HTN, HLD, and mild AS who presents for a 2-week telehealth follow-up visit.   He had a phone visit with Dr. Bronson Ing on 07/22/2018 and reported worsening dyspnea on exertion and lower extremity edema with weight elevated at 272 lbs (previous baseline of 260 - 262 lbs). He was continued on Torsemide 12m BID with repeat labs recommended. BNP on 4/29 was at 1098 with BMET showing K+ at 3.9 and creatinine 1.18.  In talking with the patient today, he reports improvement in his lower extremity edema and dyspnea over the past  week. He is now able to walk to his mailbox and garden without significant symptoms. He has started to utilize compression stockings on a daily basis. Has baseline 3-pillow orthopnea which is unchanged. Weight has improved on his home scales, previously peaking at 276 lbs, now at 271 lbs. Denies any recent chest pain or palpitations. Reports good compliance with Eliquis and denies any recent melena, hematochezia, or hematuria.   The patient does not have symptoms concerning for COVID-19 infection (fever, chills, cough, or new shortness of breath).    Past Medical History:  Diagnosis Date  . Atrial flutter ([facility] 12/2010   Admitted with symptomatic bradycardia (HR 40s), atrial flutter with slow ventricular response 12/2010 + volume overload; AV nodal agents d/c'd and Pradaxa started; RFA in 01/2011  . CHF (congestive heart failure) ([place]   . Chronic combined systolic and diastolic heart failure (HCC)    echo 01/06/11: mild LVH, EF 65-70%, mod to severe LAE, mild RVE, mild RAE, PASP 32;   TEE 10/12: EF 45-50%   . Class 2 severe obesity due to excess calories with serious comorbidity and body mass index (BMI) of 37.0 to 37.9 in adult ([place] 07/17/2017  . Diabetes mellitus    non insulin dependant  . Hyperlipidemia   . Hypertension   . Osteoarthritis   . Presence of permanent cardiac pacemaker   . PSVT (paroxysmal supraventricular tachycardia) (HCC)    Possibly atrial flutter   Past Surgical History:  Procedure Laterality Date  . ATRIAL FLUTTER ABLATION N/A 02/07/2011   Procedure: ATRIAL FLUTTER ABLATION;  Surgeon: GEvans Lance  MD;  Location: [facility] CATH LAB;  Service: Cardiovascular;  Laterality: N/A;  . CARDIAC ELECTROPHYSIOLOGY STUDY AND ABLATION  02/07/11  . CARDIOVERSION N/A 03/23/2016   Procedure: CARDIOVERSION;  Surgeon: Evans Lance, MD;  Location: [facility];  Service: Cardiovascular;  Laterality: N/A;  . COLONOSCOPY N/A 04/17/2017   Procedure: COLONOSCOPY;  Surgeon: Rogene Houston, MD;  [facility];  Service: Endoscopy;  Laterality: N/A;  830  . EP IMPLANTABLE DEVICE N/A 08/29/2015   Procedure: Pacemaker Implant;  Surgeon: Evans Lance, MD;  Location: [facility] CV LAB;  Service: Cardiovascular;  Laterality: N/A;  . EP IMPLANTABLE DEVICE N/A 12/07/2015   Procedure: PPM Lead Revision/Repair;  Surgeon: Evans Lance, MD;  Location: [facility] CV LAB;  Service: Cardiovascular;  Laterality: N/A;  . KNEE ARTHROSCOPY  2005   left  . LUMBAR SPINE SURGERY     "I've had 6 ORs 1972 thru 2004"  . POLYPECTOMY  04/17/2017   Procedure: POLYPECTOMY;  Surgeon: Rogene Houston, MD;  [facility];  Service: Endoscopy;;  transverse colon x3;     Current Meds  Medication Sig  . amLODipine (NORVASC) 10 MG tablet Take 1 tablet (10 mg total) by mouth daily. One daily  . benazepril (LOTENSIN) 40 MG tablet Take 1 tablet (40 mg total) by mouth daily.  . benzonatate (TESSALON PERLES) 100 MG capsule Take 1 capsule (100 mg total) by mouth every 6 (six) hours as needed for cough.  . carvedilol (COREG) 25 MG tablet TAKE 1 TABLET (25 MG TOTAL) BY MOUTH 2 (TWO) TIMES DAILY WITH A MEAL.  Marland Kitchen ELIQUIS 5 MG TABS tablet TAKE 1 TABLET TWICE DAILY  . glucose blood test strip 1 each by Other route as needed. Use as instructed bid  . Insulin Degludec (TRESIBA FLEXTOUCH) 200 UNIT/ML SOPN Inject 60 Units into the skin at bedtime.  . Insulin Pen Needle 31G X 8 MM MISC USE EVERY DAY AS DIRECTED BY PHYSICIAN  . Magnesium 400 MG CAPS Take 400 mg by mouth daily.   . metFORMIN (GLUCOPHAGE) 500 MG tablet Take 2 tablets (1,000 mg total) by mouth 2 (two) times daily with a meal. (Patient taking differently: Take 500 mg by mouth 2 (two) times daily with a meal. )  . Multiple Vitamins-Minerals (MULTIVITAMIN WITH MINERALS) tablet Take 1 tablet by mouth daily.  Marland Kitchen Resveratrol 100 MG CAPS Take 100 mg by mouth daily.   . rosuvastatin (CRESTOR) 20 MG tablet Take 1 tablet (20 mg total) by mouth daily.   . tamsulosin (FLOMAX) 0.4 MG CAPS capsule Take 1 capsule (0.4 mg total) by mouth at bedtime.  . torsemide (DEMADEX) 20 MG tablet Take 40 mg by mouth 2 (two) times daily.  . TRULICITY 1.5 ZO/1.0RU SOPN INJECT  1.5MG (CONTENTS OF 1 PEN)  INTO  THE  SKIN ONE TIME WEEKLY     Allergies:   Patient has no known allergies.   Social History   Tobacco Use  . Smoking status: Former Smoker    Packs/day: 1.00    Years: 10.00    Pack years: 10.00    Types: Cigarettes    Last attempt to quit: 03/26/1986    Years since quitting: 32.3  . Smokeless tobacco: Never Used  . Tobacco comment: "stopped cigarette  smoking 1988"  Substance Use Topics  . Alcohol use: Yes    Alcohol/week: 0.0 standard drinks    Comment: "quit alcohol ~ 2007"  . Drug use: No  Family Hx: The patient's family history includes Heart attack in his brother, father, and mother; Hypertension in his father and mother. There is no history of Colon cancer.  ROS:   Please see the history of present illness.     All other systems reviewed and are negative.   Prior CV studies:   The following studies were reviewed today:  Echocardiogram: 01/2018 Study Conclusions  - Left ventricle: The cavity size was normal. Wall thickness was   increased in a pattern of mild LVH. Systolic function was mildly   reduced. The estimated ejection fraction was 45%. The study is   not technically sufficient to allow evaluation of LV diastolic   function. - Regional wall motion abnormality: Hypokinesis of the apical   anterior, apical inferior, apical septal, and apical myocardium. - Aortic valve: There was mild stenosis. Moderate calcification of   noncoronary cusp in particular. Valve area (VTI): 1.56 cm^2.   Valve area (Vmax): 1.49 cm^2. Valve area (Vmean): 1.53 cm^2. - Mitral valve: There was mild to moderate regurgitation. - Left atrium: The atrium was severely dilated. - Right ventricle: Pacer wire or catheter noted in right ventricle.  - Right atrium: Pacer wire or catheter noted in right atrium. - Tricuspid valve: There was mild regurgitation. - Pulmonary arteries: PA peak pressure: 30 mm Hg (S). - Systemic veins: The IVC is dilated with normal respiratory   variation. Estimated right atrial pressure is 8 mmHg.  Labs/Other Tests and Data Reviewed:    EKG:  No ECG reviewed.  Recent Labs: 03/02/2018: Hemoglobin 12.4; Platelets 133 06/11/2018: ALT 26 07/23/2018: BNP 1,098.1; BUN 27; Creatinine, Ser 1.18; Potassium 3.9; Sodium 141   Recent Lipid Panel Lab Results  Component Value Date/Time   CHOL 142 01/17/2018 08:35 AM   TRIG 90 01/17/2018 08:35 AM   HDL 53 01/17/2018 08:35 AM   CHOLHDL 2.7 01/17/2018 08:35 AM   CHOLHDL 3.2 11/16/2013 07:02 AM   LDLCALC 71 01/17/2018 08:35 AM    Wt Readings from Last 3 Encounters:  08/05/18 271 lb (122.9 kg)  07/22/18 272 lb (123.4 kg)  05/05/18 268 lb 9.6 oz (121.8 kg)     Objective:    Vital Signs:  Wt 271 lb (122.9 kg)   BMI 37.80 kg/m    General: Pleasant male sounding in NAD Psych: Normal affect. Neuro: Alert and oriented X 3.  Lungs:  Resp regular and unlabored while talking on the phone.  ASSESSMENT & PLAN:    1. Chronic Combined Systolic and Diastolic CHF - the patient has a known reduced EF of 45%. Was recently having issues with dyspnea and lower extremity edema but he reports improvement in his symptoms with dose adjustment of Torsemide, now taking 47m BID. Weight has declined by 5 lbs and he reports improvement in his symptoms. Renal function and electrolytes have remained stable.  - will continue on current dose of Torsemide 460mBID. Also remains on Coreg and Benazepril. Encouraged him to continue to utilize compression stockings on a daily basis and monitor sodium and fluid intake.   2. Paroxysmal Atrial Fibrillation - he denies any recent palpitations. Continue Coreg for rate-control. - he denies any evidence of active bleeding. Continue Eliquis 73m44mBID for anticoagulation.   3. Complete Heart Block - s/p PPM placement in 2017. Device interrogation in 06/2018 showed normal device function. Followed by Dr. TayLovena Le 4. HTN - he does not have a BP cuff at home but was well-controlled at 113/73 when checked by  Home Health earlier this month.  - continue Coreg 33m BID, Amlodipine 149mdaily, and Benazepril 4072maily.   5. HLD - followed by PCP. FLP in 12/2017 showed total cholesterol of 142, HDL 53, and LDL 71. Remains on Crestor 50m31mily.   6. Aortic Stenosis - mild by echocardiogram in 01/2018.  7. COVID-19 Education - The signs and symptoms of COVID-19 were discussed with the patient and how to seek care for testing. The importance of social distancing was discussed today.  Time:   Today, I have spent 16 minutes with the patient with telehealth technology discussing the above problems.     Medication Adjustments/Labs and Tests Ordered: Current medicines are reviewed at length with the patient today.  Concerns regarding medicines are outlined above.   Tests Ordered: No orders of the defined types were placed in this encounter.   Medication Changes: No orders of the defined types were placed in this encounter.   Disposition:  Follow up with Dr. KoneBronson Ing2-3 months.   Signed, BritErma Heritage-C  08/05/2018 5:49 PM    [facility]

## 2018-08-05 NOTE — Patient Instructions (Signed)
Medication Instructions:  Your physician recommends that you continue on your current medications as directed. Please refer to the Current Medication list given to you today.  May take extra Torsemide 20 mg if needed for weight gain greater than 3 pounds overnight.   Labwork: NONE   Testing/Procedures: NONE   Follow-Up: Your physician recommends that you schedule a follow-up appointment in: 2-3 Months with Dr. Bronson Ing in the [place] office.    Any Other Special Instructions Will Be Listed Below (If Applicable).     If you need a refill on your cardiac medications before your next appointment, please call your pharmacy.  Thank you for choosing [facility]!

## 2018-08-08 ENCOUNTER — Other Ambulatory Visit: Payer: Self-pay | Admitting: Cardiology

## 2018-08-08 ENCOUNTER — Telehealth: Payer: Self-pay | Admitting: Cardiovascular Disease

## 2018-08-08 DIAGNOSIS — I38 Endocarditis, valve unspecified: Secondary | ICD-10-CM

## 2018-08-08 MED ORDER — METOLAZONE 2.5 MG PO TABS: 2.5000 mg | ORAL_TABLET | Freq: Every day | ORAL | Status: DC | PRN

## 2018-08-08 MED ORDER — POTASSIUM CHLORIDE ER 20 MEQ PO TBCR: 10.0000 meq | EXTENDED_RELEASE_TABLET | Freq: Every day | ORAL | 0 refills | Status: DC | PRN

## 2018-08-08 NOTE — Telephone Encounter (Signed)
[facility] Visit Follow Up Request   Agency Requested:    [facility] Contact:  Glory Buff, NP [address] [place], [place] [address] Phone #:  [phone] Fax #:  [phone]  Patient Demographic Information: Name:  John [name] Age:  73 y.o.   DOB:  [birth year]  MRN:  [id number]   Requesting Provider:  Daune Perch, NP, Dr. Bronson Ing   Home visit progress note(s), lab results, telemetry strips, etc were reviewed.  Provider Recommendations: Glory Buff called office urgently to  Speak with APP stating that she has had a contact with the patient today that was self requested due to increase in weight and LE edema. Wt up to 274 from 271 on 5/12. Pt states has been taking his torsemide as ordered.   Will have pt take metolazone 2.5 mg daily for 3 days then can take every other day as needed for increased wt/edema. KDur 59mq daily when takes metolazone. Will have Bmet checked early next week.   [facility] I also sent them in electronically to get them into the record.   Follow up home services requested:  Vital Signs (BP, Pulse, O2, Weight)  Physical Exam  Medication Reconciliation  Labs:  BMet early next week  All labs ordered for this home visit have been released.   Will route this to Dr. KBronson Ing BDarlen Roundand CBarbarann Ehlersfor FBurwell

## 2018-08-08 NOTE — Telephone Encounter (Signed)
Go ahead and place order for them, I will sign

## 2018-08-08 NOTE — Progress Notes (Signed)
See telephone note.

## 2018-08-08 NOTE — Telephone Encounter (Signed)
I will forward to Dr Bronson Ing

## 2018-08-08 NOTE — Telephone Encounter (Signed)
Sharyn Lull, from [facility] called asking for an order to be place so they can go see the patient at home.  They patient called them requesting a visit, as he is complaining for weight gain, and leg swelling.

## 2018-08-11 DIAGNOSIS — I5042 Chronic combined systolic (congestive) and diastolic (congestive) heart failure: Secondary | ICD-10-CM | POA: Diagnosis not present

## 2018-08-11 DIAGNOSIS — I38 Endocarditis, valve unspecified: Secondary | ICD-10-CM | POA: Diagnosis not present

## 2018-08-11 NOTE — Progress Notes (Signed)
Home Visit done on behalf of [facility].  [facility]'s assessment form filled out and will be faxed to the ordering provider.   Labs drawn and brought to [facility] in [place] to be resulted. John [name] states he feels better than he was, his swelling to LUE and BLE's has decreased, but he has gained weight from yesterday (271 lbs) and today is at 275 lbs.  C/o redness to his BLE's and slight redness to LFA that all began with the edema approx 2 weeks ago.  He calls the redness a "rash" but denies itching or pain associated with it.  Also says he has occasional shob while lying in bed but says all sx's have improved from how he was.

## 2018-08-12 LAB — BASIC METABOLIC PANEL
BUN/Creatinine Ratio: 12 (ref 10–24)
BUN: 18 mg/dL (ref 8–27)
CO2: 21 mmol/L (ref 20–29)
Calcium: 9.2 mg/dL (ref 8.6–10.2)
Chloride: 105 mmol/L (ref 96–106)
Creatinine, Ser: 1.53 mg/dL — ABNORMAL HIGH (ref 0.76–1.27)
GFR calc Af Amer: 52 mL/min/{1.73_m2} — ABNORMAL LOW (ref >59)
GFR calc non Af Amer: 45 mL/min/{1.73_m2} — ABNORMAL LOW (ref >59)
Glucose: 75 mg/dL (ref 65–99)
Potassium: 4.6 mmol/L (ref 3.5–5.2)
Sodium: 138 mmol/L (ref 134–144)

## 2018-08-14 NOTE — Progress Notes (Signed)
Very difficult to read scanned information. Would recommend that note be made clearer and possibly typed printed.

## 2018-08-19 ENCOUNTER — Emergency Department (HOSPITAL_COMMUNITY): Payer: Medicare HMO

## 2018-08-19 ENCOUNTER — Encounter (HOSPITAL_COMMUNITY): Payer: Self-pay

## 2018-08-19 ENCOUNTER — Other Ambulatory Visit: Payer: Self-pay

## 2018-08-19 ENCOUNTER — Telehealth: Payer: Self-pay | Admitting: Nurse Practitioner

## 2018-08-19 ENCOUNTER — Inpatient Hospital Stay (HOSPITAL_COMMUNITY)
Admission: EM | Admit: 2018-08-19 | Discharge: 2018-08-22 | DRG: 292 | Disposition: A | Discharge status: Home / Self Care | Payer: Medicare HMO | Attending: Cardiology | Admitting: Cardiology

## 2018-08-19 DIAGNOSIS — I480 Paroxysmal atrial fibrillation: Secondary | ICD-10-CM | POA: Diagnosis not present

## 2018-08-19 DIAGNOSIS — I5033 Acute on chronic diastolic (congestive) heart failure: Secondary | ICD-10-CM

## 2018-08-19 DIAGNOSIS — Z794 Long term (current) use of insulin: Secondary | ICD-10-CM

## 2018-08-19 DIAGNOSIS — I4892 Unspecified atrial flutter: Secondary | ICD-10-CM | POA: Diagnosis present

## 2018-08-19 DIAGNOSIS — Z8249 Family history of ischemic heart disease and other diseases of the circulatory system: Secondary | ICD-10-CM

## 2018-08-19 DIAGNOSIS — I4819 Other persistent atrial fibrillation: Secondary | ICD-10-CM | POA: Diagnosis not present

## 2018-08-19 DIAGNOSIS — I361 Nonrheumatic tricuspid (valve) insufficiency: Secondary | ICD-10-CM | POA: Diagnosis not present

## 2018-08-19 DIAGNOSIS — M1990 Unspecified osteoarthritis, unspecified site: Secondary | ICD-10-CM | POA: Diagnosis present

## 2018-08-19 DIAGNOSIS — I110 Hypertensive heart disease with heart failure: Secondary | ICD-10-CM | POA: Diagnosis not present

## 2018-08-19 DIAGNOSIS — Z87891 Personal history of nicotine dependence: Secondary | ICD-10-CM | POA: Diagnosis not present

## 2018-08-19 DIAGNOSIS — Z79899 Other long term (current) drug therapy: Secondary | ICD-10-CM | POA: Diagnosis not present

## 2018-08-19 DIAGNOSIS — Z20828 Contact with and (suspected) exposure to other viral communicable diseases: Secondary | ICD-10-CM | POA: Diagnosis present

## 2018-08-19 DIAGNOSIS — E119 Type 2 diabetes mellitus without complications: Secondary | ICD-10-CM | POA: Diagnosis present

## 2018-08-19 DIAGNOSIS — N183 Chronic kidney disease, stage 3 unspecified: Secondary | ICD-10-CM | POA: Insufficient documentation

## 2018-08-19 DIAGNOSIS — I509 Heart failure, unspecified: Secondary | ICD-10-CM

## 2018-08-19 DIAGNOSIS — R0602 Shortness of breath: Secondary | ICD-10-CM | POA: Diagnosis not present

## 2018-08-19 DIAGNOSIS — Z950 Presence of cardiac pacemaker: Secondary | ICD-10-CM

## 2018-08-19 DIAGNOSIS — I5022 Chronic systolic (congestive) heart failure: Secondary | ICD-10-CM

## 2018-08-19 DIAGNOSIS — Z86010 Personal history of colonic polyps: Secondary | ICD-10-CM | POA: Diagnosis not present

## 2018-08-19 DIAGNOSIS — R0600 Dyspnea, unspecified: Secondary | ICD-10-CM | POA: Diagnosis present

## 2018-08-19 DIAGNOSIS — I5023 Acute on chronic systolic (congestive) heart failure: Secondary | ICD-10-CM

## 2018-08-19 DIAGNOSIS — E785 Hyperlipidemia, unspecified: Secondary | ICD-10-CM | POA: Diagnosis not present

## 2018-08-19 DIAGNOSIS — I340 Nonrheumatic mitral (valve) insufficiency: Secondary | ICD-10-CM | POA: Diagnosis not present

## 2018-08-19 DIAGNOSIS — I10 Essential (primary) hypertension: Secondary | ICD-10-CM | POA: Diagnosis not present

## 2018-08-19 DIAGNOSIS — I5043 Acute on chronic combined systolic (congestive) and diastolic (congestive) heart failure: Secondary | ICD-10-CM | POA: Diagnosis not present

## 2018-08-19 LAB — COMPREHENSIVE METABOLIC PANEL
ALT: 24 U/L (ref 0–44)
AST: 36 U/L (ref 15–41)
Albumin: 3.4 g/dL — ABNORMAL LOW (ref 3.5–5.0)
Alkaline Phosphatase: 152 U/L — ABNORMAL HIGH (ref 38–126)
Anion gap: 8 (ref 5–15)
BUN: 21 mg/dL (ref 8–23)
CO2: 22 mmol/L (ref 22–32)
Calcium: 9.3 mg/dL (ref 8.9–10.3)
Chloride: 109 mmol/L (ref 98–111)
Creatinine, Ser: 1.31 mg/dL — ABNORMAL HIGH (ref 0.61–1.24)
GFR calc Af Amer: >60 mL/min (ref >60)
GFR calc non Af Amer: 54 mL/min — ABNORMAL LOW (ref >60)
Glucose, Bld: 99 mg/dL (ref 70–99)
Potassium: 4.4 mmol/L (ref 3.5–5.1)
Sodium: 139 mmol/L (ref 135–145)
Total Bilirubin: 2.1 mg/dL — ABNORMAL HIGH (ref 0.3–1.2)
Total Protein: 7.8 g/dL (ref 6.5–8.1)

## 2018-08-19 LAB — CBC
HCT: 39.2 % (ref 39.0–52.0)
Hemoglobin: 13 g/dL (ref 13.0–17.0)
MCH: 28.1 pg (ref 26.0–34.0)
MCHC: 33.2 g/dL (ref 30.0–36.0)
MCV: 84.7 fL (ref 80.0–100.0)
Platelets: 92 10*3/uL — ABNORMAL LOW (ref 150–400)
RBC: 4.63 MIL/uL (ref 4.22–5.81)
RDW: 19.9 % — ABNORMAL HIGH (ref 11.5–15.5)
WBC: 7.2 10*3/uL (ref 4.0–10.5)
nRBC: 0 % (ref 0.0–0.2)

## 2018-08-19 LAB — CBC WITH DIFFERENTIAL/PLATELET
Abs Immature Granulocytes: 0.01 10*3/uL (ref 0.00–0.07)
Basophils Absolute: 0.1 10*3/uL (ref 0.0–0.1)
Basophils Relative: 1 %
Eosinophils Absolute: 0.1 10*3/uL (ref 0.0–0.5)
Eosinophils Relative: 1 %
HCT: 40.1 % (ref 39.0–52.0)
Hemoglobin: 13 g/dL (ref 13.0–17.0)
Immature Granulocytes: 0 %
Lymphocytes Relative: 22 %
Lymphs Abs: 1.4 10*3/uL (ref 0.7–4.0)
MCH: 27.8 pg (ref 26.0–34.0)
MCHC: 32.4 g/dL (ref 30.0–36.0)
MCV: 85.9 fL (ref 80.0–100.0)
Monocytes Absolute: 0.7 10*3/uL (ref 0.1–1.0)
Monocytes Relative: 12 %
Neutro Abs: 3.9 10*3/uL (ref 1.7–7.7)
Neutrophils Relative %: 64 %
Platelets: 88 10*3/uL — ABNORMAL LOW (ref 150–400)
RBC: 4.67 MIL/uL (ref 4.22–5.81)
RDW: 19.9 % — ABNORMAL HIGH (ref 11.5–15.5)
WBC: 6.1 10*3/uL (ref 4.0–10.5)
nRBC: 0 % (ref 0.0–0.2)

## 2018-08-19 LAB — HEMOGLOBIN A1C
Hgb A1c MFr Bld: 6.8 % — ABNORMAL HIGH (ref 4.8–5.6)
Mean Plasma Glucose: 148.46 mg/dL

## 2018-08-19 LAB — URINALYSIS, ROUTINE W REFLEX MICROSCOPIC
Bilirubin Urine: NEGATIVE
Glucose, UA: NEGATIVE mg/dL
Hgb urine dipstick: NEGATIVE
Ketones, ur: NEGATIVE mg/dL
Leukocytes,Ua: NEGATIVE
Nitrite: NEGATIVE
Protein, ur: NEGATIVE mg/dL
Specific Gravity, Urine: 1.014 (ref 1.005–1.030)
pH: 5 (ref 5.0–8.0)

## 2018-08-19 LAB — GLUCOSE, CAPILLARY: Glucose-Capillary: 99 mg/dL (ref 70–99)

## 2018-08-19 LAB — SARS CORONAVIRUS 2 BY RT PCR (HOSPITAL ORDER, PERFORMED IN ~~LOC~~ HOSPITAL LAB): SARS Coronavirus 2: NEGATIVE

## 2018-08-19 LAB — CREATININE, SERUM
Creatinine, Ser: 1.3 mg/dL — ABNORMAL HIGH (ref 0.61–1.24)
GFR calc Af Amer: >60 mL/min (ref >60)
GFR calc non Af Amer: 55 mL/min — ABNORMAL LOW (ref >60)

## 2018-08-19 LAB — BRAIN NATRIURETIC PEPTIDE: B Natriuretic Peptide: 1285.6 pg/mL — ABNORMAL HIGH (ref 0.0–100.0)

## 2018-08-19 MED ORDER — ONDANSETRON HCL 4 MG/2ML IJ SOLN: 4.0000 mg | Freq: Four times a day (QID) | INTRAMUSCULAR | Status: DC | PRN

## 2018-08-19 MED ORDER — APIXABAN 5 MG PO TABS
5.0000 mg | ORAL_TABLET | Freq: Two times a day (BID) | ORAL | Status: DC
  Administered 2018-08-19 – 2018-08-22 (×6): 5 mg via ORAL
  Filled 2018-08-19 (×6): qty 1

## 2018-08-19 MED ORDER — SODIUM CHLORIDE 0.9% FLUSH
3.0000 mL | Freq: Two times a day (BID) | INTRAVENOUS | Status: DC
  Administered 2018-08-19 – 2018-08-22 (×4): 3 mL via INTRAVENOUS

## 2018-08-19 MED ORDER — ACETAMINOPHEN 325 MG PO TABS: 650.0000 mg | ORAL_TABLET | ORAL | Status: DC | PRN

## 2018-08-19 MED ORDER — FUROSEMIDE 10 MG/ML IJ SOLN
60.0000 mg | Freq: Once | INTRAMUSCULAR | Status: AC
  Administered 2018-08-19: 60 mg via INTRAVENOUS
  Filled 2018-08-19: qty 6

## 2018-08-19 MED ORDER — POTASSIUM CHLORIDE CRYS ER 20 MEQ PO TBCR
20.0000 meq | EXTENDED_RELEASE_TABLET | Freq: Two times a day (BID) | ORAL | Status: DC
  Administered 2018-08-19 – 2018-08-22 (×6): 20 meq via ORAL
  Filled 2018-08-19 (×6): qty 1

## 2018-08-19 MED ORDER — ROSUVASTATIN CALCIUM 20 MG PO TABS
20.0000 mg | ORAL_TABLET | Freq: Every day | ORAL | Status: DC
  Administered 2018-08-20 – 2018-08-22 (×3): 20 mg via ORAL
  Filled 2018-08-19 (×3): qty 1

## 2018-08-19 MED ORDER — FUROSEMIDE 10 MG/ML IJ SOLN
40.0000 mg | Freq: Two times a day (BID) | INTRAMUSCULAR | Status: DC
  Administered 2018-08-19 – 2018-08-21 (×5): 40 mg via INTRAVENOUS
  Filled 2018-08-19 (×5): qty 4

## 2018-08-19 MED ORDER — HEPARIN SODIUM (PORCINE) 5000 UNIT/ML IJ SOLN: 5000.0000 [IU] | Freq: Three times a day (TID) | INTRAMUSCULAR | Status: DC

## 2018-08-19 MED ORDER — INSULIN ASPART 100 UNIT/ML ~~LOC~~ SOLN
0.0000 [IU] | Freq: Three times a day (TID) | SUBCUTANEOUS | Status: DC
  Administered 2018-08-20: 2 [IU] via SUBCUTANEOUS
  Administered 2018-08-22: 3 [IU] via SUBCUTANEOUS

## 2018-08-19 MED ORDER — CARVEDILOL 25 MG PO TABS
25.0000 mg | ORAL_TABLET | Freq: Two times a day (BID) | ORAL | Status: DC
  Administered 2018-08-20 – 2018-08-22 (×5): 25 mg via ORAL
  Filled 2018-08-19 (×5): qty 1

## 2018-08-19 MED ORDER — SODIUM CHLORIDE 0.9% FLUSH: 3.0000 mL | INTRAVENOUS | Status: DC | PRN

## 2018-08-19 MED ORDER — AMLODIPINE BESYLATE 10 MG PO TABS
10.0000 mg | ORAL_TABLET | Freq: Every day | ORAL | Status: DC
  Administered 2018-08-20 – 2018-08-22 (×3): 10 mg via ORAL
  Filled 2018-08-19 (×3): qty 1

## 2018-08-19 MED ORDER — SODIUM CHLORIDE 0.9 % IV SOLN: 250.0000 mL | INTRAVENOUS | Status: DC | PRN

## 2018-08-19 NOTE — ED Notes (Signed)
Pt given crackers, cheese and drink.  Okay per MD Vanita Panda.

## 2018-08-19 NOTE — Telephone Encounter (Signed)
Received call from Sharyn Lull, NP with [facility] who states she has advised patient to go to the hospital due to difficulty breathing and symptoms of CHF. She states patient is having symptoms despite daily dose of zaroxolyn 2.5 mg. She had a visit scheduled with the patient for later today. I advised that I will forward the note to primary cardiologist and team.

## 2018-08-19 NOTE — ED Notes (Signed)
ED TO INPATIENT HANDOFF REPORT  ED Nurse Name and Phone #: Caprice Kluver [phone]  S Name/Age/Gender John [name] 73 y.o. male Room/Bed: 040C/040C  Code Status   Code Status: Prior  Home/SNF/Other Home Patient oriented to: self, place, time and situation Is this baseline? Yes   Triage Complete: Triage complete  Chief Complaint SOB/left arm swollen  Triage Note Left arm swelling for 1 week, he reports his ankles were swollen, but not as bad as his left arm.  He has home health that wanted him to come to ER because  Of weight gain of 6 lbs and shob.   Allergies No Known Allergies  Level of Care/Admitting Diagnosis ED Disposition    ED Disposition Condition Comment   Admit  Hospital Area: [facility] [[id number]]  Level of Care: Telemetry Cardiac [103]  Covid Evaluation: N/A  Diagnosis: CHF (congestive heart failure) [facility]) [[id number]]  Admitting Physician: Martinique, PETER M [[id number]]  Attending Physician: Martinique, PETER M [phone]  Estimated length of stay: past midnight tomorrow  Certification:: I certify this patient will need inpatient services for at least 2 midnights  PT Class (Do Not Modify): Inpatient [101]  PT Acc Code (Do Not Modify): Private [1]       B Medical/Surgery History Past Medical History:  Diagnosis Date  . Atrial flutter ([place]) 12/2010   Admitted with symptomatic bradycardia (HR 40s), atrial flutter with slow ventricular response 12/2010 + volume overload; AV nodal agents d/c'd and Pradaxa started; RFA in 01/2011  . CHF (congestive heart failure) ([place])   . Chronic combined systolic and diastolic heart failure (HCC)    echo 01/06/11: mild LVH, EF 65-70%, mod to severe LAE, mild RVE, mild RAE, PASP 32;   TEE 10/12: EF 45-50%   . Class 2 severe obesity due to excess calories with serious comorbidity and body mass index (BMI) of 37.0 to 37.9 in adult ([facility]) 07/17/2017  . Diabetes mellitus    non insulin dependant  . Hyperlipidemia   .  Hypertension   . Osteoarthritis   . Presence of permanent cardiac pacemaker   . PSVT (paroxysmal supraventricular tachycardia) (HCC)    Possibly atrial flutter   Past Surgical History:  Procedure Laterality Date  . ATRIAL FLUTTER ABLATION N/A 02/07/2011   Procedure: ATRIAL FLUTTER ABLATION;  Surgeon: Evans Lance, MD;  Location: [facility] CATH LAB;  Service: Cardiovascular;  Laterality: N/A;  . CARDIAC ELECTROPHYSIOLOGY STUDY AND ABLATION  02/07/11  . CARDIOVERSION N/A 03/23/2016   Procedure: CARDIOVERSION;  Surgeon: Evans Lance, MD;  Location: [facility];  Service: Cardiovascular;  Laterality: N/A;  . COLONOSCOPY N/A 04/17/2017   Procedure: COLONOSCOPY;  Surgeon: Rogene Houston, MD;  [facility];  Service: Endoscopy;  Laterality: N/A;  830  . EP IMPLANTABLE DEVICE N/A 08/29/2015   Procedure: Pacemaker Implant;  Surgeon: Evans Lance, MD;  Location: [facility] CV LAB;  Service: Cardiovascular;  Laterality: N/A;  . EP IMPLANTABLE DEVICE N/A 12/07/2015   Procedure: PPM Lead Revision/Repair;  Surgeon: Evans Lance, MD;  Location: [facility] CV LAB;  Service: Cardiovascular;  Laterality: N/A;  . KNEE ARTHROSCOPY  2005   left  . LUMBAR SPINE SURGERY     "I've had 6 ORs 1972 thru 2004"  . POLYPECTOMY  04/17/2017   Procedure: POLYPECTOMY;  Surgeon: Rogene Houston, MD;  [facility];  Service: Endoscopy;;  transverse colon x3;     A IV Location/Drains/Wounds Patient Lines/Drains/Airways Status   Active Line/Drains/Airways  Name:   Placement date:   Placement time:   Site:   Days:   Peripheral IV 08/19/18 Right Hand   08/19/18    1129    Hand   less than 1          Intake/Output Last 24 hours  Intake/Output Summary (Last 24 hours) at 08/19/2018 1901 Last data filed at 08/19/2018 1439 Gross per 24 hour  Intake -  Output 800 ml  Net -800 ml    Labs/Imaging Results for orders placed or performed during the hospital encounter of 08/19/18 (from the past  48 hour(s))  Comprehensive metabolic panel     Status: Abnormal   Collection Time: 08/19/18 11:20 AM  Result Value Ref Range   Sodium 139 135 - 145 mmol/L   Potassium 4.4 3.5 - 5.1 mmol/L   Chloride 109 98 - 111 mmol/L   CO2 22 22 - 32 mmol/L   Glucose, Bld 99 70 - 99 mg/dL   BUN 21 8 - 23 mg/dL   Creatinine, Ser 1.31 (H) 0.61 - 1.24 mg/dL   Calcium 9.3 8.9 - 10.3 mg/dL   Total Protein 7.8 6.5 - 8.1 g/dL   Albumin 3.4 (L) 3.5 - 5.0 g/dL   AST 36 15 - 41 U/L   ALT 24 0 - 44 U/L   Alkaline Phosphatase 152 (H) 38 - 126 U/L   Total Bilirubin 2.1 (H) 0.3 - 1.2 mg/dL   GFR calc non Af Amer 54 (L) >60 mL/min   GFR calc Af Amer >60 >60 mL/min   Anion gap 8 5 - 15    Comment: Performed at [facility], [address] [place], [place] [address]  CBC WITH DIFFERENTIAL     Status: Abnormal   Collection Time: 08/19/18 11:20 AM  Result Value Ref Range   WBC 6.1 4.0 - 10.5 K/uL   RBC 4.67 4.22 - 5.81 MIL/uL   Hemoglobin 13.0 13.0 - 17.0 g/dL   HCT 40.1 39.0 - 52.0 %   MCV 85.9 80.0 - 100.0 fL   MCH 27.8 26.0 - 34.0 pg   MCHC 32.4 30.0 - 36.0 g/dL   RDW 19.9 (H) 11.5 - 15.5 %   Platelets 88 (L) 150 - 400 K/uL    Comment: REPEATED TO VERIFY PLATELET COUNT CONFIRMED BY SMEAR    nRBC 0.0 0.0 - 0.2 %   Neutrophils Relative % 64 %   Neutro Abs 3.9 1.7 - 7.7 K/uL   Lymphocytes Relative 22 %   Lymphs Abs 1.4 0.7 - 4.0 K/uL   Monocytes Relative 12 %   Monocytes Absolute 0.7 0.1 - 1.0 K/uL   Eosinophils Relative 1 %   Eosinophils Absolute 0.1 0.0 - 0.5 K/uL   Basophils Relative 1 %   Basophils Absolute 0.1 0.0 - 0.1 K/uL   Immature Granulocytes 0 %   Abs Immature Granulocytes 0.01 0.00 - 0.07 K/uL    Comment: Performed at [facility] [address] [place], [place] [address]  Brain natriuretic peptide     Status: Abnormal   Collection Time: 08/19/18 11:20 AM  Result Value Ref Range   B Natriuretic Peptide 1,285.6 (H) 0.0 - 100.0 pg/mL    Comment: Performed at [facility] [address], [place], [place] [address]  Urinalysis, Routine w reflex microscopic     Status: None   Collection Time: 08/19/18 11:20 AM  Result Value Ref Range   Color, Urine YELLOW YELLOW  APPearance CLEAR CLEAR   Specific Gravity, Urine 1.014 1.005 - 1.030   pH 5.0 5.0 - 8.0   Glucose, UA NEGATIVE NEGATIVE mg/dL   Hgb urine dipstick NEGATIVE NEGATIVE   Bilirubin Urine NEGATIVE NEGATIVE   Ketones, ur NEGATIVE NEGATIVE mg/dL   Protein, ur NEGATIVE NEGATIVE mg/dL   Nitrite NEGATIVE NEGATIVE   Leukocytes,Ua NEGATIVE NEGATIVE    Comment: Performed at [facility] [address], [place], [place] [address]   Dg Chest Port 1 View  Result Date: 08/19/2018 CLINICAL DATA:  Left arm swelling.  Shortness of breath. EXAM: PORTABLE CHEST 1 VIEW COMPARISON:  03/02/2018. FINDINGS: Cardiac pacer with lead tips over the right atrium right ventricle. Cardiomegaly with pulmonary venous congestion bilateral interstitial prominence. Findings suggest CHF. No pleural effusion or pneumothorax. IMPRESSION: 1.  Cardiac pacer stable position. 2. Cardiomegaly with bilateral from interstitial prominence suggesting mild CHF. Electronically Signed   By: Marcello Moores  Register   On: 08/19/2018 11:30    Pending Labs Unresulted Labs (From admission, onward)    Start     Ordered   08/19/18 1900  Hemoglobin A1c  Once,   STAT     08/19/18 1900   08/19/18 1853  SARS Coronavirus 2 (CEPHEID - Performed in [facility] hospital lab), Hosp Order  (Asymptomatic Patients Labs)  Once,   R    Question:  Rule Out  Answer:  Yes   08/19/18 1853   Signed and Held  Basic metabolic panel  Daily,   R     Signed and Held   Signed and Held  CBC  (heparin)  Once,   R    Comments:  Baseline for heparin therapy IF NOT ALREADY DRAWN.  Notify MD if PLT < 100 K.    Signed and Held   Signed and Held  Creatinine, serum  (heparin)  Once,   R    Comments:  Baseline for heparin therapy IF NOT ALREADY DRAWN.    Signed and Held    Signed and Held  Basic metabolic panel  Tomorrow morning,   R     Signed and Held   Signed and Held  CBC WITH DIFFERENTIAL  Tomorrow morning,   R     Signed and Held          Vitals/Pain Today's Vitals   08/19/18 1545 08/19/18 1600 08/19/18 1615 08/19/18 1700  BP: 126/68 119/73 132/85 120/76  Pulse: 65 (!) 28 75 (!) 55  Resp: 15 18 19 11   Temp:      TempSrc:      SpO2: 99% 100% 97% 99%  Weight:      Height:      PainSc:        Isolation Precautions No active isolations  Medications Medications  insulin aspart (novoLOG) injection 0-15 Units (has no administration in time range)  furosemide (LASIX) injection 60 mg (60 mg Intravenous Given 08/19/18 1318)    Mobility walks Low fall risk   Focused Assessments Cardiac [place]   R Recommendations: See Admitting Provider Note  Report given to:   Additional Notes:

## 2018-08-19 NOTE — ED Triage Notes (Signed)
Left arm swelling for 1 week, he reports his ankles were swollen, but not as bad as his left arm.  He has home health that wanted him to come to ER because  Of weight gain of 6 lbs and shob.

## 2018-08-19 NOTE — H&P (Signed)
History & Physical    Patient ID: BERLEY GAMBRELL MRN: [id number], DOB/AGE: 1945-04-02      Admit date: 08/19/2018   Primary Physician: Mikey Kirschner, MD Primary Cardiologist: [name], MD   Patient Profile   ARJUN HARD is a 73 y.o. male with past medical history of CHB (s/p PPM placement in 2017), paroxysmal atrial flutter (s/p ablation in 2012), paroxysmal atrial fibrillation (on Eliquis), chronic combined systolic and diastolic CHF (EF 40% in 9811), HTN, HLD, and mild AS who is being admitted by Cardiology for CHF exacerbation and IV diuretics.   Past Medical History   Past Medical History:  Diagnosis Date  . Atrial flutter ([place]) 12/2010   Admitted with symptomatic bradycardia (HR 40s), atrial flutter with slow ventricular response 12/2010 + volume overload; AV nodal agents d/c'd and Pradaxa started; RFA in 01/2011  . CHF (congestive heart failure) ([place])   . Chronic combined systolic and diastolic heart failure (HCC)    echo 01/06/11: mild LVH, EF 65-70%, mod to severe LAE, mild RVE, mild RAE, PASP 32;   TEE 10/12: EF 45-50%   . Class 2 severe obesity due to excess calories with serious comorbidity and body mass index (BMI) of 37.0 to 37.9 in adult ([place]) 07/17/2017  . Diabetes mellitus    non insulin dependant  . Hyperlipidemia   . Hypertension   . Osteoarthritis   . Presence of permanent cardiac pacemaker   . PSVT (paroxysmal supraventricular tachycardia) (HCC)    Possibly atrial flutter    Past Surgical History:  Procedure Laterality Date  . ATRIAL FLUTTER ABLATION N/A 02/07/2011   Procedure: ATRIAL FLUTTER ABLATION;  Surgeon: Evans Lance, MD;  Location: [facility] CATH LAB;  Service: Cardiovascular;  Laterality: N/A;  . CARDIAC ELECTROPHYSIOLOGY STUDY AND ABLATION  02/07/11  . CARDIOVERSION N/A 03/23/2016   Procedure: CARDIOVERSION;  Surgeon: Evans Lance, MD;  Location: [place];  Service: Cardiovascular;  Laterality: N/A;  . COLONOSCOPY N/A 04/17/2017    Procedure: COLONOSCOPY;  Surgeon: Rogene Houston, MD;  Location: [facility];  Service: Endoscopy;  Laterality: N/A;  830  . EP IMPLANTABLE DEVICE N/A 08/29/2015   Procedure: Pacemaker Implant;  Surgeon: Evans Lance, MD;  Location: [facility] CV LAB;  Service: Cardiovascular;  Laterality: N/A;  . EP IMPLANTABLE DEVICE N/A 12/07/2015   Procedure: PPM Lead Revision/Repair;  Surgeon: Evans Lance, MD;  Location: [place] CV LAB;  Service: Cardiovascular;  Laterality: N/A;  . KNEE ARTHROSCOPY  2005   left  . LUMBAR SPINE SURGERY     "I've had 6 ORs 1972 thru 2004"  . POLYPECTOMY  04/17/2017   Procedure: POLYPECTOMY;  Surgeon: Rogene Houston, MD;  Location: [facility];  Service: Endoscopy;;  transverse colon x3;    Allergies  No Known Allergies  History of Present Illness    RAYDIN BIELINSKI is a 73 y.o. male with past medical history of CHB (s/p PPM placement in 2017), paroxysmal atrial flutter (s/p ablation in 2012), paroxysmal atrial fibrillation (on Eliquis), chronic combined systolic and diastolic CHF (EF 91% in 4782), HTN, HLD, and mild AS who presents to [facility] on 08/21/2018 acute on chronic CHF exacerbation.   He had a phone visit with Dr. Bronson Ing on 07/22/2018 and reported worsening dyspnea on exertion and lower extremity edema with weight elevated at 272 lbs (previous baseline of 260 - 262 lbs). He was continued on Torsemide 56m BID with repeat labs recommended. BNP on 4/29 was at 1098 with  BMET showing K+ at 3.9 and creatinine 1.18.  He was then seen by Bernerd Pho, [place] on 08/05/2018 for 2 week follow up. At that time he reported symptom improvement in his lower extremity edema and dyspnea over the past week. He reported that he was able to walk to his mailbox and garden without significant symptoms. He has started to utilize compression stockings on a daily basis. Has baseline 3-pillow orthopnea which is unchanged. Weight has improved on his home scales, previously  peaking at 276 lbs, now at 271 lbs. Denies any recent chest pain or palpitations. Reports good compliance with Eliquis and denies any recent melena, hematochezia, or hematuria.   He was then seen in follow up in which the patient had gained 6lb and was changed from Torsemide 5m BID to Metalazone 2.564mevery other day. He then noticed his weight gain with associated SOB. He has significant LE swelling and crackles on lund exam. He denies chest pain or other anginal symptoms. He reports diet and medication compliance.   Home Medications    Prior to Admission medications   Medication Sig Start Date End Date Taking? Authorizing Provider  amLODipine (NORVASC) 10 MG tablet Take 1 tablet (10 mg total) by mouth daily. One daily 06/30/18  Yes LuMikey KirschnerMD  benazepril (LOTENSIN) 40 MG tablet Take 1 tablet (40 mg total) by mouth daily. 06/30/18  Yes LuMikey KirschnerMD  benzonatate (TESSALON PERLES) 100 MG capsule Take 1 capsule (100 mg total) by mouth every 6 (six) hours as needed for cough. 05/05/18 05/05/19 Yes LaLendon ColonelNP  carvedilol (COREG) 25 MG tablet TAKE 1 TABLET (25 MG TOTAL) BY MOUTH 2 (TWO) TIMES DAILY WITH A MEAL. 08/13/17  Yes KoHerminio CommonsMD  ELIQUIS 5 MG TABS tablet TAKE 1 TABLET TWICE DAILY Patient taking differently: Take 5 mg by mouth 2 (two) times daily.  05/14/18  Yes TaEvans LanceMD  Insulin Degludec (TRESIBA FLEXTOUCH) 200 UNIT/ML SOPN Inject 60 Units into the skin at bedtime. 06/18/18  Yes NiCassandria AngerMD  Magnesium 400 MG CAPS Take 400 mg by mouth daily.    Yes [provider]  metFORMIN (GLUCOPHAGE) 500 MG tablet Take 2 tablets (1,000 mg total) by mouth 2 (two) times daily with a meal. Patient taking differently: Take 500 mg by mouth 2 (two) times daily with a meal.  06/18/18  Yes Nida, GeMarella ChimesMD  metolazone (ZAROXOLYN) 2.5 MG tablet Take 2.5 mg by mouth as needed. 08/08/18  Yes [provider]  Multiple  Vitamins-Minerals (MULTIVITAMIN WITH MINERALS) tablet Take 1 tablet by mouth daily.   Yes [provider]  potassium chloride 20 MEQ TBCR Take 10 mEq by mouth daily as needed. Take when taking metolazone. 08/08/18  Yes HaDaune PerchNP  Resveratrol 100 MG CAPS Take 100 mg by mouth at bedtime.    Yes [provider]  rosuvastatin (CRESTOR) 20 MG tablet Take 1 tablet (20 mg total) by mouth daily. 06/30/18  Yes LuMikey KirschnerMD  TRULICITY 1.5 MGCZ/6.6AYOPN INJECT  1.5MG (CONTENTS OF 1 PEN)  INTO  THE  SKIN ONE TIME WEEKLY Patient taking differently: Inject 1.5 mg as directed once a week. Wednesday 07/10/18  Yes NiCassandria AngerMD  glucose blood test strip 1 each by Other route as needed. Use as instructed bid 07/23/17   NiCassandria AngerMD  Insulin Pen Needle 31G X 8 MM MISC USE EVERY DAY AS DIRECTED BY PHYSICIAN  03/20/18   Cassandria Anger, MD  tamsulosin (FLOMAX) 0.4 MG CAPS capsule Take 1 capsule (0.4 mg total) by mouth at bedtime. Patient not taking: Reported on 08/19/2018 06/30/18   Mikey Kirschner, MD    Family History    Family History  Problem Relation Age of Onset  . Heart attack Mother   . Hypertension Mother   . Heart attack Father   . Hypertension Father   . Heart attack Brother   . Colon cancer Neg Hx     Social History    Social History   Socioeconomic History  . Marital status: Married    Spouse name: Not on file  . Number of children: Not on file  . Years of education: Not on file  . Highest education level: Not on file  Occupational History  . Occupation: Immunologist: RETIRED  Social Needs  . Financial resource strain: Not on file  . Food insecurity:    Worry: Not on file    Inability: Not on file  . Transportation needs:    Medical: Not on file    Non-medical: Not on file  Tobacco Use  . Smoking status: Former Smoker    Packs/day: 1.00    Years: 10.00    Pack years: 10.00    Types: Cigarettes     Last attempt to quit: 03/26/1986    Years since quitting: 32.4  . Smokeless tobacco: Never Used  . Tobacco comment: "stopped cigarette  smoking 1988"  Substance and Sexual Activity  . Alcohol use: Yes    Alcohol/week: 0.0 standard drinks    Comment: "quit alcohol ~ 2007"  . Drug use: No  . Sexual activity: Yes    Partners: Female  Lifestyle  . Physical activity:    Days per week: Not on file    Minutes per session: Not on file  . Stress: Not on file  Relationships  . Social connections:    Talks on phone: Not on file    Gets together: Not on file    Attends religious service: Not on file    Active member of club or organization: Not on file    Attends meetings of clubs or organizations: Not on file    Relationship status: Not on file  . Intimate partner violence:    Fear of current or ex partner: Not on file    Emotionally abused: Not on file    Physically abused: Not on file    Forced sexual activity: Not on file  Other Topics Concern  . Not on file  Social History Narrative  . Not on file    Review of Systems   See HPI  All other systems reviewed and are otherwise negative except as noted above.  Physical Exam    Blood pressure 132/85, pulse 75, temperature 97.8 F (36.6 C), temperature source Oral, resp. rate 19, height 5' 11"  (1.803 m), weight 126.1 kg, SpO2 97 %.   General: Overweight, NAD Skin: Warm, dry, intact  Head: Normocephalic, atraumatic, clear, moist mucus membranes. Neck: Negative for carotid bruits. No JVD Lungs: Crackles in lower bases bilaterally.Breathing is unlabored. Cardiovascular: RRR with S1 S2. No murmurs, rubs, gallops, or LV heave appreciated. Abdomen: Soft, non-tender, distended with normoactive bowel sounds. No obvious abdominal masses. Extremities: 3+ BLE edema. No clubbing or cyanosis. DP/PT pulses 1+ bilaterally Neuro: Alert and oriented. No focal deficits. No facial asymmetry. MAE spontaneously. Psych: Responds to questions  appropriately with normal affect.  Labs    Troponin (Point of Care Test) No results for input(s): TROPIPOC in the last 72 hours. No results for input(s): CKTOTAL, CKMB, TROPONINI in the last 72 hours. Lab Results  Component Value Date   WBC 6.1 08/19/2018   HGB 13.0 08/19/2018   HCT 40.1 08/19/2018   MCV 85.9 08/19/2018   PLT 88 (L) 08/19/2018    Recent Labs  Lab 08/19/18 1120  NA 139  K 4.4  CL 109  CO2 22  BUN 21  CREATININE 1.31*  CALCIUM 9.3  PROT 7.8  BILITOT 2.1*  ALKPHOS 152*  ALT 24  AST 36  GLUCOSE 99   Lab Results  Component Value Date   CHOL 142 01/17/2018   HDL 53 01/17/2018   LDLCALC 71 01/17/2018   TRIG 90 01/17/2018   Lab Results  Component Value Date   DDIMER (H) 06/27/2007    1.52        AT THE INHOUSE ESTABLISHED CUTOFF VALUE OF 0.48 ug/mL FEU, THIS ASSAY HAS BEEN DOCUMENTED IN THE LITERATURE TO HAVE     Radiology Studies    Dg Chest Port 1 View  Result Date: 08/19/2018 CLINICAL DATA:  Left arm swelling.  Shortness of breath. EXAM: PORTABLE CHEST 1 VIEW COMPARISON:  03/02/2018. FINDINGS: Cardiac pacer with lead tips over the right atrium right ventricle. Cardiomegaly with pulmonary venous congestion bilateral interstitial prominence. Findings suggest CHF. No pleural effusion or pneumothorax. IMPRESSION: 1.  Cardiac pacer stable position. 2. Cardiomegaly with bilateral from interstitial prominence suggesting mild CHF. Electronically Signed   By: Marcello Moores  Register   On: 08/19/2018 11:30   ECG & Cardiac Imaging    Echocardiogram 10/2015:  Study Conclusions  - Left ventricle: The cavity size was normal. Systolic function was   mildly to moderately reduced. The estimated ejection fraction was   in the range of 40% to 45%. - Aortic valve: There was mild stenosis. - Mitral valve: There was mild regurgitation. - Left atrium: The atrium was moderately dilated. - Atrial septum: No defect or patent foramen ovale was identified.  Assessment  & Plan    1. Acute on chronic CHF exacerbation: -Will start Lasix 22m BI for now and watch renal function closely  -Continue Kdur 241m and follow -Pt reports weight gain of 6lb after medication change from Trosemide 409mID to Metalazone 2.5mg56mery other day -Creatinine, 1.31 today with a baseline of 1.1-1.2 -BMET in AM -Daily weight and strict I&O -Weight, 278lb -BNP, 1285 -CXR with pulmonary edema  -Will obtain echocardiogram for more current LV evaluation   2. Hx of CHB: -s/p PPM -Currently V paced   3. HTN: -Stable, 132/85>119/73>126/68 -Continue amlodipine, carvedilol  4. HLD: -LDL, 71 on 01/17/2018 -Continue   5. Hx of PAF s/p ablation 2012: -On Eliquis  -Currently V paced on EKG/telemetry   6. DM2: -SSI for glucose control while inpatient status   Severity of Illness: The appropriate patient status for this patient is INPATIENT. Inpatient status is judged to be reasonable and necessary in order to provide the required intensity of service to ensure the patient's safety. The patient's presenting symptoms, physical exam findings, and initial radiographic and laboratory data in the context of their chronic comorbidities is felt to place them at high risk for further clinical deterioration. Furthermore, it is not anticipated that the patient will be medically stable for discharge from the hospital within 2 midnights of admission. The following factors support the patient status of inpatient.   " The  patient's presenting symptoms include SOB. " The worrisome physical exam findings include BLE " The initial radiographic and laboratory data are worrisome because of elevated BNP " The chronic co-morbidities include CHF, HTN, DM2   * I certify that at the point of admission it is my clinical judgment that the patient will require inpatient hospital care spanning beyond 2 midnights from the point of admission due to high intensity of service, high risk for further  deterioration and high frequency of surveillance required.*     Signed, Kathyrn Drown NP-C [facility] Pager: [phone] 08/19/2018, @NOW

## 2018-08-19 NOTE — ED Notes (Signed)
This RN acting as Art therapist and asked pt if he would like for me to update any family/friends. Pt declined at this time and is able to keep his family informed.

## 2018-08-19 NOTE — Telephone Encounter (Signed)
Thank you. IF symptoms are that bad, she needs to be seen.  I agree

## 2018-08-19 NOTE — Plan of Care (Signed)

## 2018-08-19 NOTE — ED Provider Notes (Signed)
[facility] EMERGENCY DEPARTMENT Provider Note   CSN: [id number] Arrival date & time: 08/19/18  1013    History   Chief Complaint No chief complaint on file.   HPI John [name] is a 73 y.o. male.     HPI Patient presents with concern of dyspnea and swelling. Patient has a history of congestive heart failure, notes that he was recently changed from Lasix to metolazone and torsemide. However, over the past week, and in particular the past days he has had increasing swelling throughout his habitus, primarily in the left, and his abdomen. No true pain, though there is generalized discomfort, and increasing dyspnea, particularly with activity. No fever, no cough. Past Medical History:  Diagnosis Date  . Atrial flutter ([place]) 12/2010   Admitted with symptomatic bradycardia (HR 40s), atrial flutter with slow ventricular response 12/2010 + volume overload; AV nodal agents d/c'd and Pradaxa started; RFA in 01/2011  . CHF (congestive heart failure) ([place])   . Chronic combined systolic and diastolic heart failure (HCC)    echo 01/06/11: mild LVH, EF 65-70%, mod to severe LAE, mild RVE, mild RAE, PASP 32;   TEE 10/12: EF 45-50%   . Class 2 severe obesity due to excess calories with serious comorbidity and body mass index (BMI) of 37.0 to 37.9 in adult ([place]) 07/17/2017  . Diabetes mellitus    non insulin dependant  . Hyperlipidemia   . Hypertension   . Osteoarthritis   . Presence of permanent cardiac pacemaker   . PSVT (paroxysmal supraventricular tachycardia) (HCC)    Possibly atrial flutter    Patient Active Problem List   Diagnosis Date Noted  . Acute on chronic systolic CHF (congestive heart failure), NYHA class 3 ([place]) 03/02/2018  . Pacemaker 03/02/2018  . History of colonic polyps 12/28/2016  . Atrial pacemaker lead displacement 12/07/2015  . Acute CHF (congestive heart failure) ([place]) 11/21/2015  . CHF, acute on chronic ([place]) 11/20/2015  . Complete heart  block ([place]) 11/20/2015  . Chronic diastolic CHF (congestive heart failure) ([place]) 08/28/2015  . Hypertensive heart disease 08/28/2015  . NSVT (nonsustained ventricular tachycardia) ([place]) 08/28/2015  . Elevated troponin 08/28/2015  . Second degree heart block 08/25/2015  . Bradycardia 08/25/2015  . Acute diastolic CHF (congestive heart failure) ([place]) 08/25/2015  . Current use of long term anticoagulation 05/04/2011  . Atrial flutter ([place]) 01/05/2011  . DM type 2 causing vascular disease ([place]) 12/23/2009  . Mixed hyperlipidemia 12/23/2009  . HYPERTENSION 12/23/2009  . DEGENERATIVE JOINT DISEASE 12/23/2009    Past Surgical History:  Procedure Laterality Date  . ATRIAL FLUTTER ABLATION N/A 02/07/2011   Procedure: ATRIAL FLUTTER ABLATION;  Surgeon: Evans Lance, MD;  Location: [facility] CATH LAB;  Service: Cardiovascular;  Laterality: N/A;  . CARDIAC ELECTROPHYSIOLOGY STUDY AND ABLATION  02/07/11  . CARDIOVERSION N/A 03/23/2016   Procedure: CARDIOVERSION;  Surgeon: Evans Lance, MD;  Location: [facility];  Service: Cardiovascular;  Laterality: N/A;  . COLONOSCOPY N/A 04/17/2017   Procedure: COLONOSCOPY;  Surgeon: Rogene Houston, MD;  Location: [facility]O [facility];  Service: Endoscopy;  Laterality: N/A;  830  . EP IMPLANTABLE DEVICE N/A 08/29/2015   Procedure: Pacemaker Implant;  Surgeon: Evans Lance, MD;  Location: [facility] CV LAB;  Service: Cardiovascular;  Laterality: N/A;  . EP IMPLANTABLE DEVICE N/A 12/07/2015   Procedure: PPM Lead Revision/Repair;  Surgeon: Evans Lance, MD;  Location: [facility] CV LAB;  Service: Cardiovascular;  Laterality: N/A;  . KNEE ARTHROSCOPY  2005   left  . LUMBAR SPINE SURGERY     "I've had 6 ORs 1972 thru 2004"  . POLYPECTOMY  04/17/2017   Procedure: POLYPECTOMY;  Surgeon: Rogene Houston, MD;  Location: [facility];  Service: Endoscopy;;  transverse colon x3;        Home Medications    Prior to Admission medications   Medication Sig Start Date  End Date Taking? Authorizing Provider  amLODipine (NORVASC) 10 MG tablet Take 1 tablet (10 mg total) by mouth daily. One daily 06/30/18   Mikey Kirschner, MD  benazepril (LOTENSIN) 40 MG tablet Take 1 tablet (40 mg total) by mouth daily. 06/30/18   Mikey Kirschner, MD  benzonatate (TESSALON PERLES) 100 MG capsule Take 1 capsule (100 mg total) by mouth every 6 (six) hours as needed for cough. 05/05/18 05/05/19  Lendon Colonel, NP  carvedilol (COREG) 25 MG tablet TAKE 1 TABLET (25 MG TOTAL) BY MOUTH 2 (TWO) TIMES DAILY WITH A MEAL. 08/13/17   Herminio Commons, MD  ELIQUIS 5 MG TABS tablet TAKE 1 TABLET TWICE DAILY 05/14/18   Evans Lance, MD  glucose blood test strip 1 each by Other route as needed. Use as instructed bid 07/23/17   Cassandria Anger, MD  Insulin Degludec (TRESIBA FLEXTOUCH) 200 UNIT/ML SOPN Inject 60 Units into the skin at bedtime. 06/18/18   Cassandria Anger, MD  Insulin Pen Needle 31G X 8 MM MISC USE EVERY DAY AS DIRECTED BY PHYSICIAN 03/20/18   Cassandria Anger, MD  Magnesium 400 MG CAPS Take 400 mg by mouth daily.     [provider]  metFORMIN (GLUCOPHAGE) 500 MG tablet Take 2 tablets (1,000 mg total) by mouth 2 (two) times daily with a meal. Patient taking differently: Take 500 mg by mouth 2 (two) times daily with a meal.  06/18/18   Nida, Marella Chimes, MD  Multiple Vitamins-Minerals (MULTIVITAMIN WITH MINERALS) tablet Take 1 tablet by mouth daily.    [provider]  potassium chloride 20 MEQ TBCR Take 10 mEq by mouth daily as needed. Take when taking metolazone. 08/08/18   Daune Perch, NP  Resveratrol 100 MG CAPS Take 100 mg by mouth daily.     [provider]  rosuvastatin (CRESTOR) 20 MG tablet Take 1 tablet (20 mg total) by mouth daily. 06/30/18   Mikey Kirschner, MD  tamsulosin (FLOMAX) 0.4 MG CAPS capsule Take 1 capsule (0.4 mg total) by mouth at bedtime. 06/30/18   Mikey Kirschner, MD  torsemide (DEMADEX) 20 MG tablet  Take 40 mg by mouth 2 (two) times daily.    [provider]  TRULICITY 1.5 ZO/1.0RU SOPN INJECT  1.5MG (CONTENTS OF 1 PEN)  INTO  THE  SKIN ONE TIME WEEKLY 07/10/18   Cassandria Anger, MD    Family History Family History  Problem Relation Age of Onset  . Heart attack Mother   . Hypertension Mother   . Heart attack Father   . Hypertension Father   . Heart attack Brother   . Colon cancer Neg Hx     Social History Social History   Tobacco Use  . Smoking status: Former Smoker    Packs/day: 1.00    Years: 10.00    Pack years: 10.00    Types: Cigarettes    Last attempt to quit: 03/26/1986    Years since quitting: 32.4  . Smokeless tobacco: Never Used  . Tobacco comment: "stopped cigarette  smoking  1988"  Substance Use Topics  . Alcohol use: Yes    Alcohol/week: 0.0 standard drinks    Comment: "quit alcohol ~ 2007"  . Drug use: No     Allergies   Patient has no known allergies.   Review of Systems Review of Systems  Constitutional:       Per HPI, otherwise negative  HENT:       Per HPI, otherwise negative  Respiratory:       Per HPI, otherwise negative  Cardiovascular:       Per HPI, otherwise negative  Gastrointestinal: Negative for vomiting.  Endocrine:       Negative aside from HPI  Genitourinary:       Neg aside from HPI   Musculoskeletal:       Per HPI, otherwise negative  Skin: Negative.   Neurological: Negative for syncope.     Physical Exam Updated Vital Signs BP 105/80 (BP Location: Right Arm)   Pulse 69   Temp 97.8 F (36.6 C) (Oral)   Resp 20   Ht 5' 11"  (1.803 m)   Wt 126.1 kg   SpO2 100%   BMI 38.77 kg/m   Physical Exam Vitals signs and nursing note reviewed.  Constitutional:      General: He is not in acute distress.    Appearance: He is well-developed.  HENT:     Head: Normocephalic and atraumatic.  Eyes:     Conjunctiva/sclera: Conjunctivae normal.  Cardiovascular:     Rate and Rhythm: Normal rate and regular  rhythm.  Pulmonary:     Effort: Pulmonary effort is normal. No respiratory distress.     Breath sounds: No stridor.  Abdominal:     General: There is distension.     Tenderness: There is no abdominal tenderness. There is no guarding.  Musculoskeletal:     Right lower leg: Edema present.     Left lower leg: Edema present.     Comments: Substantial edema in his left upper extremity, both lower extremities, and abdomen.  Skin:    General: Skin is warm and dry.  Neurological:     Mental Status: He is alert and oriented to person, place, and time.      ED Treatments / Results  Labs (all labs ordered are listed, but only abnormal results are displayed) Labs Reviewed  COMPREHENSIVE METABOLIC PANEL - Abnormal; Notable for the following components:      Result Value   Creatinine, Ser 1.31 (*)    Albumin 3.4 (*)    Alkaline Phosphatase 152 (*)    Total Bilirubin 2.1 (*)    GFR calc non Af Amer 54 (*)    All other components within normal limits  CBC WITH DIFFERENTIAL/PLATELET - Abnormal; Notable for the following components:   RDW 19.9 (*)    Platelets 88 (*)    All other components within normal limits  BRAIN NATRIURETIC PEPTIDE - Abnormal; Notable for the following components:   B Natriuretic Peptide 1,285.6 (*)    All other components within normal limits  URINALYSIS, ROUTINE W REFLEX MICROSCOPIC    EKG EKG Interpretation  Date/Time:  Tuesday Aug 19 2018 10:34:26 EDT Ventricular Rate:  97 PR Interval:    QRS Duration: 178 QT Interval:  464 QTC Calculation: 589 R Axis:   -102 Text Interpretation:  Ventricular-paced rhythm with occasional Premature ventricular complexes Abnormal ECG Confirmed by Carmin Muskrat [id number]) on 08/19/2018 1:15:43 PM   Radiology Dg Chest Port 1 View  Result Date:  08/19/2018 CLINICAL DATA:  Left arm swelling.  Shortness of breath. EXAM: PORTABLE CHEST 1 VIEW COMPARISON:  03/02/2018. FINDINGS: Cardiac pacer with lead tips over the right atrium  right ventricle. Cardiomegaly with pulmonary venous congestion bilateral interstitial prominence. Findings suggest CHF. No pleural effusion or pneumothorax. IMPRESSION: 1.  Cardiac pacer stable position. 2. Cardiomegaly with bilateral from interstitial prominence suggesting mild CHF. Electronically Signed   By: Marcello Moores  Register   On: 08/19/2018 11:30    Procedures Procedures (including critical care time)  Medications Ordered in ED Medications  furosemide (LASIX) injection 60 mg (has no administration in time range)     Initial Impression / Assessment and Plan / ED Course  I have reviewed the triage vital signs and the nursing notes.  Pertinent labs & imaging results that were available during my care of the patient were reviewed by me and considered in my medical decision making (see chart for details).      With initial concern of CHF exacerbation the patient received IV Lasix, 60 mg, while initial studies were conducted.   Date: Patient in similar condition  Update:, Patient has received IV Lasix, labs consistent with heart failure exacerbation with a BNP of greater than 1200, x-ray consistent with overloaded status, as is the patient's physical exam. Given concern for this, patient's case discussed with our cardiology colleagues for admission for further monitoring, management.  On no early evidence for concurrent disease including coronavirus, nor ACS.  Patient does however, have some evidence for worsening renal function, this may be secondary to attempts at oral diuresis.  Final Clinical Impressions(s) / ED Diagnoses   Final diagnoses:  Acute on chronic diastolic congestive heart failure (HCC)      Carmin Muskrat, MD 08/20/18 2231

## 2018-08-20 ENCOUNTER — Inpatient Hospital Stay (HOSPITAL_COMMUNITY): Payer: Medicare HMO

## 2018-08-20 DIAGNOSIS — I361 Nonrheumatic tricuspid (valve) insufficiency: Secondary | ICD-10-CM

## 2018-08-20 DIAGNOSIS — I4819 Other persistent atrial fibrillation: Secondary | ICD-10-CM

## 2018-08-20 DIAGNOSIS — I340 Nonrheumatic mitral (valve) insufficiency: Secondary | ICD-10-CM

## 2018-08-20 LAB — BASIC METABOLIC PANEL
Anion gap: 11 (ref 5–15)
BUN: 23 mg/dL (ref 8–23)
CO2: 22 mmol/L (ref 22–32)
Calcium: 9.1 mg/dL (ref 8.9–10.3)
Chloride: 106 mmol/L (ref 98–111)
Creatinine, Ser: 1.29 mg/dL — ABNORMAL HIGH (ref 0.61–1.24)
GFR calc Af Amer: >60 mL/min (ref >60)
GFR calc non Af Amer: 55 mL/min — ABNORMAL LOW (ref >60)
Glucose, Bld: 58 mg/dL — ABNORMAL LOW (ref 70–99)
Potassium: 3.8 mmol/L (ref 3.5–5.1)
Sodium: 139 mmol/L (ref 135–145)

## 2018-08-20 LAB — CBC WITH DIFFERENTIAL/PLATELET
Abs Immature Granulocytes: 0.02 10*3/uL (ref 0.00–0.07)
Basophils Absolute: 0.1 10*3/uL (ref 0.0–0.1)
Basophils Relative: 1 %
Eosinophils Absolute: 0.1 10*3/uL (ref 0.0–0.5)
Eosinophils Relative: 2 %
HCT: 36.6 % — ABNORMAL LOW (ref 39.0–52.0)
Hemoglobin: 12.4 g/dL — ABNORMAL LOW (ref 13.0–17.0)
Immature Granulocytes: 0 %
Lymphocytes Relative: 29 %
Lymphs Abs: 1.8 10*3/uL (ref 0.7–4.0)
MCH: 28.2 pg (ref 26.0–34.0)
MCHC: 33.9 g/dL (ref 30.0–36.0)
MCV: 83.4 fL (ref 80.0–100.0)
Monocytes Absolute: 0.8 10*3/uL (ref 0.1–1.0)
Monocytes Relative: 13 %
Neutro Abs: 3.3 10*3/uL (ref 1.7–7.7)
Neutrophils Relative %: 55 %
Platelets: 85 10*3/uL — ABNORMAL LOW (ref 150–400)
RBC: 4.39 MIL/uL (ref 4.22–5.81)
RDW: 19.8 % — ABNORMAL HIGH (ref 11.5–15.5)
WBC: 6.1 10*3/uL (ref 4.0–10.5)
nRBC: 0 % (ref 0.0–0.2)

## 2018-08-20 LAB — ECHOCARDIOGRAM COMPLETE
Height: 71 in
Weight: 4307.2 oz

## 2018-08-20 LAB — GLUCOSE, CAPILLARY
Glucose-Capillary: 52 mg/dL — ABNORMAL LOW (ref 70–99)
Glucose-Capillary: 84 mg/dL (ref 70–99)
Glucose-Capillary: 129 mg/dL — ABNORMAL HIGH (ref 70–99)
Glucose-Capillary: 60 mg/dL — ABNORMAL LOW (ref 70–99)
Glucose-Capillary: 79 mg/dL (ref 70–99)
Glucose-Capillary: 128 mg/dL — ABNORMAL HIGH (ref 70–99)

## 2018-08-20 NOTE — Progress Notes (Signed)
Progress Note  Patient Name: John [name] Date of Encounter: 08/20/2018  Primary Cardiologist: [name], MD   Subjective   Feels much better. Good response to IV lasix. Less dyspnea and edema.   Inpatient Medications    Scheduled Meds: . amLODipine  10 mg Oral Daily  . apixaban  5 mg Oral BID  . carvedilol  25 mg Oral BID WC  . furosemide  40 mg Intravenous BID  . insulin aspart  0-15 Units Subcutaneous TID WC  . potassium chloride  20 mEq Oral BID  . rosuvastatin  20 mg Oral Daily  . sodium chloride flush  3 mL Intravenous Q12H   Continuous Infusions: . sodium chloride     PRN Meds: sodium chloride, acetaminophen, ondansetron (ZOFRAN) IV, sodium chloride flush   Vital Signs    Vitals:   08/19/18 2326 08/20/18 0353 08/20/18 0518 08/20/18 0754  BP: 133/81 113/74 125/85 126/73  Pulse: 80 62 81 63  Resp: 16 16 18 16   Temp: 98.2 F (36.8 C) 97.8 F (36.6 C) 98.4 F (36.9 C) 98 F (36.7 C)  TempSrc: Oral Oral Oral Oral  SpO2: 100% 99% 100% 97%  Weight:   122.1 kg   Height:        Intake/Output Summary (Last 24 hours) at 08/20/2018 0850 Last data filed at 08/20/2018 0755 Gross per 24 hour  Intake 363 ml  Output 1800 ml  Net -1437 ml   Last 3 Weights 08/20/2018 08/19/2018 08/19/2018  Weight (lbs) 269 lb 3.2 oz 274 lb 1.6 oz 278 lb  Weight (kg) 122.108 kg 124.331 kg 126.1 kg      Telemetry    V paced at 75 bpm - Personally Reviewed  ECG    V paced- Personally Reviewed  Physical Exam   GEN: No acute distress. overweight Neck: No JVD Cardiac: RRR, soft 1/6 systolic murmur at apex.  Respiratory: faint left basilar rales. GI: Soft, nontender, non-distended  MS: 2+ edema; less erythema.  No deformity. Neuro:  Nonfocal  Psych: Normal affect   Labs    Chemistry Recent Labs  Lab 08/19/18 1120 08/19/18 2010 08/20/18 0510  NA 139  --  139  K 4.4  --  3.8  CL 109  --  106  CO2 22  --  22  GLUCOSE 99  --  58*  BUN 21  --  23   CREATININE 1.31* 1.30* 1.29*  CALCIUM 9.3  --  9.1  PROT 7.8  --   --   ALBUMIN 3.4*  --   --   AST 36  --   --   ALT 24  --   --   ALKPHOS 152*  --   --   BILITOT 2.1*  --   --   GFRNONAA 54* 55* 55*  GFRAA >60 >60 >60  ANIONGAP 8  --  11     Hematology Recent Labs  Lab 08/19/18 1120 08/19/18 2010 08/20/18 0510  WBC 6.1 7.2 6.1  RBC 4.67 4.63 4.39  HGB 13.0 13.0 12.4*  HCT 40.1 39.2 36.6*  MCV 85.9 84.7 83.4  MCH 27.8 28.1 28.2  MCHC 32.4 33.2 33.9  RDW 19.9* 19.9* 19.8*  PLT 88* 92* 85*    Cardiac EnzymesNo results for input(s): TROPONINI in the last 168 hours. No results for input(s): TROPIPOC in the last 168 hours.   BNP Recent Labs  Lab 08/19/18 1120  BNP 1,285.6*     DDimer No results for input(s): DDIMER  in the last 168 hours.   Radiology    Dg Chest Port 1 View  Result Date: 08/19/2018 CLINICAL DATA:  Left arm swelling.  Shortness of breath. EXAM: PORTABLE CHEST 1 VIEW COMPARISON:  03/02/2018. FINDINGS: Cardiac pacer with lead tips over the right atrium right ventricle. Cardiomegaly with pulmonary venous congestion bilateral interstitial prominence. Findings suggest CHF. No pleural effusion or pneumothorax. IMPRESSION: 1.  Cardiac pacer stable position. 2. Cardiomegaly with bilateral from interstitial prominence suggesting mild CHF. Electronically Signed   By: Marcello Moores  Register   On: 08/19/2018 11:30    Cardiac Studies   Echo pending  Patient Profile     73 y.o. male with past medical history of CHB (s/p PPM placement in 2017), paroxysmal atrial flutter (s/p ablation in 2012), paroxysmal atrial fibrillation (on Eliquis), chronic combined systolic and diastolic CHF (EF 76% in 2831), HTN, HLD, and mild AS who presents to [facility] on 08/21/2018 acute on chronic CHF exacerbation.   Assessment & Plan    1. Acute on chronic CHF exacerbation: - was on torsemide 40 mg bid. Was placed on metolazone but stopped the torsemide with significant subsequent weight gain.  -Continue Lasix 3m BID for now and watch renal function closely. Creatinine is stable. -Continue Kdur 247m and follow -BMET daily -Daily weight and strict I&O  -Weight, 278lb>>269, I/O negative 1557. Clinically improved.  -BNP, 1285 -CXR with pulmonary edema  -Will obtain echocardiogram for more current LV evaluation   2. Hx of CHB: -s/p PPM -Currently V paced  - suspect underlying Afib.  3. HTN: -Stable -Continue amlodipine, carvedilol  4. HLD: -LDL, 71 on 01/17/2018 -Continue Crestor  5. Hx of PAF s/p ablation 2012: -On Eliquis  -Currently V paced on EKG/telemetry   6. DM2: -SSI for glucose control while inpatient status   For questions or updates, please contact [facility] consult [email or web address] for contact info under        Signed, Emerson Schreifels JoMartiniqueMD  08/20/2018, 8:50 AM

## 2018-08-20 NOTE — Progress Notes (Signed)
  Echocardiogram 2D Echocardiogram has been performed.  Jennette Dubin 08/20/2018, 11:48 AM

## 2018-08-20 NOTE — Evaluation (Addendum)
Physical Therapy Evaluation Patient Details Name: John [name] MRN: [id number] DOB: 1945/12/09 Today's Date: 08/20/2018   History of Present Illness  Patient is a 73 y/o male presenting to the ED on 08/19/2018 with primary complaints of weight gain, edema and dyspnea. Past medical history of HTN, chronic diastolic CHF, PAfib s/p pacemaker. admitted by Cardiology for CHF exacerbation and IV diuretics.    Clinical Impression  Patient admitted with the above listed diagnosis. Patient reports independence with mobility and ADLs prior to admission. Patient today ambulating and performing functional mobility at supervision level for safety - no LOB or overt instability. No further acute PT needs identified. PT to sign off.      Follow Up Recommendations No PT follow up    Equipment Recommendations  None recommended by PT    Recommendations for Other Services       Precautions / Restrictions Precautions Precautions: Fall Restrictions Weight Bearing Restrictions: No      Mobility  Bed Mobility               General bed mobility comments: sitting EOB  Transfers Overall transfer level: Modified independent Equipment used: None                Ambulation/Gait Ambulation/Gait assistance: Supervision Gait Distance (Feet): 250 Feet Assistive device: None Gait Pattern/deviations: Step-through pattern;Decreased stride length Gait velocity: WNL   General Gait Details: steady pace of gait, no LOB  Stairs            Wheelchair Mobility    Modified Rankin (Stroke Patients Only)       Balance Overall balance assessment: Mild deficits observed, not formally tested                                           Pertinent Vitals/Pain Pain Assessment: No/denies pain    Home Living Family/patient expects to be discharged to:: Private residence Living Arrangements: Spouse/significant other Available Help at Discharge: Family;Available 24  hours/day Type of Home: House Home Access: Stairs to enter   [employer] of Steps: 1 Home Layout: Two level Home Equipment: None      Prior Function Level of Independence: Independent               Hand Dominance        Extremity/Trunk Assessment   Upper Extremity Assessment Upper Extremity Assessment: Overall WFL for tasks assessed    Lower Extremity Assessment Lower Extremity Assessment: Overall WFL for tasks assessed    Cervical / Trunk Assessment Cervical / Trunk Assessment: Normal  Communication   Communication: No difficulties  Cognition Arousal/Alertness: Awake/alert Behavior During Therapy: WFL for tasks assessed/performed Overall Cognitive Status: Within Functional Limits for tasks assessed                                        General Comments      Exercises     Assessment/Plan    PT Assessment Patent does not need any further PT services  PT Problem List         PT Treatment Interventions      PT Goals (Current goals can be found in the Care Plan section)  Acute Rehab PT Goals Patient Stated Goal: return home, reduce swelling PT Goal Formulation: All assessment and education  complete, DC therapy    Frequency     Barriers to discharge        Co-evaluation               AM-PAC PT "6 Clicks" Mobility  Outcome Measure Help needed turning from your back to your side while in a flat bed without using bedrails?: None Help needed moving from lying on your back to sitting on the side of a flat bed without using bedrails?: None Help needed moving to and from a bed to a chair (including a wheelchair)?: A Little Help needed standing up from a chair using your arms (e.g., wheelchair or bedside chair)?: A Little Help needed to walk in hospital room?: A Little Help needed climbing 3-5 steps with a railing? : A Little 6 Click Score: 20    End of Session Equipment Utilized During Treatment: Gait  belt Activity Tolerance: Patient tolerated treatment well Patient left: in bed;with call bell/phone within reach Nurse Communication: Mobility status PT Visit Diagnosis: Unsteadiness on feet (R26.81)    Time: 5940-9050 PT Time Calculation (min) (ACUTE ONLY): 14 min   Charges:   PT Evaluation $PT Eval Moderate Complexity: 1 Mod          Lanney Gins, PT, DPT Supplemental Physical Therapist 08/20/18 1:19 PM Pager: [phone] Office: [phone]

## 2018-08-20 NOTE — Progress Notes (Signed)
Inpatient Diabetes Program Recommendations  AACE/ADA: New Consensus Statement on Inpatient Glycemic Control   Target Ranges:  Prepandial:   less than 140 mg/dL      Peak postprandial:   less than 180 mg/dL (1-2 hours)      Critically ill patients:  140 - 180 mg/dL   Results for [name], [name] (MRN [id number]) as of 08/20/2018 08:39  Ref. Range 08/19/2018 21:37 08/20/2018 05:59 08/20/2018 06:22  Glucose-Capillary Latest Ref Range: 70 - 99 mg/dL 99 52 (L) 84  Results for [name], [name] (MRN [id number]) as of 08/20/2018 08:39  Ref. Range 06/11/2018 08:44 08/19/2018 20:10  Hemoglobin A1C Latest Ref Range: 4.8 - 5.6 % 7.5 (H) 6.8 (H)   Review of Glycemic Control  Diabetes history: DM2 Outpatient Diabetes medications: Tresiba 60 units QHS, Metformin 734 mg BID, Trulicity 1.5 mg Qweek Current orders for Inpatient glycemic control: Novolog 0-15 unit TID with meals  Inpatient Diabetes Program Recommendations:   HgbA1C: A1C 6.8% on 08/19/18 indicating an average glucose of 148 mg/dl over the past 2-3 months.  NOTE: Noted fasting glucose 52 mg/dl at 5:59 am today. NO insulin has been given since admitted. Will follow glycemic trends.  May need to add basal insulin if glucose becomes consistently greater than 180 mg/dl with just using Novolog correction scale.  Thanks, Barnie Alderman, RN, MSN, CDE Diabetes Coordinator Inpatient Diabetes Program [phone] (Team Pager from 8am to 5pm)

## 2018-08-20 NOTE — Plan of Care (Signed)
  Problem: Education: Goal: Knowledge of General Education information will improve Description: Including pain rating scale, medication(s)/side effects and non-pharmacologic comfort measures Outcome: Progressing   Problem: Activity: Goal: Risk for activity intolerance will decrease Outcome: Progressing   Problem: Elimination: Goal: Will not experience complications related to bowel motility Outcome: Progressing   

## 2018-08-21 LAB — GLUCOSE, CAPILLARY
Glucose-Capillary: 101 mg/dL — ABNORMAL HIGH (ref 70–99)
Glucose-Capillary: 119 mg/dL — ABNORMAL HIGH (ref 70–99)
Glucose-Capillary: 91 mg/dL (ref 70–99)
Glucose-Capillary: 140 mg/dL — ABNORMAL HIGH (ref 70–99)

## 2018-08-21 LAB — BASIC METABOLIC PANEL
Anion gap: 8 (ref 5–15)
BUN: 26 mg/dL — ABNORMAL HIGH (ref 8–23)
CO2: 25 mmol/L (ref 22–32)
Calcium: 8.9 mg/dL (ref 8.9–10.3)
Chloride: 106 mmol/L (ref 98–111)
Creatinine, Ser: 1.35 mg/dL — ABNORMAL HIGH (ref 0.61–1.24)
GFR calc Af Amer: >60 mL/min (ref >60)
GFR calc non Af Amer: 52 mL/min — ABNORMAL LOW (ref >60)
Glucose, Bld: 124 mg/dL — ABNORMAL HIGH (ref 70–99)
Potassium: 3.8 mmol/L (ref 3.5–5.1)
Sodium: 139 mmol/L (ref 135–145)

## 2018-08-21 NOTE — Progress Notes (Addendum)
Progress Note  Patient Name: John [name] Date of Encounter: 08/21/2018  Primary Cardiologist: [name], MD   Subjective   Breathing is much better. Good response to IV lasix. Less  edema.   Inpatient Medications    Scheduled Meds: . amLODipine  10 mg Oral Daily  . apixaban  5 mg Oral BID  . carvedilol  25 mg Oral BID WC  . furosemide  40 mg Intravenous BID  . insulin aspart  0-15 Units Subcutaneous TID WC  . potassium chloride  20 mEq Oral BID  . rosuvastatin  20 mg Oral Daily  . sodium chloride flush  3 mL Intravenous Q12H   Continuous Infusions: . sodium chloride     PRN Meds: sodium chloride, acetaminophen, ondansetron (ZOFRAN) IV, sodium chloride flush   Vital Signs    Vitals:   08/20/18 2015 08/21/18 0025 08/21/18 0533 08/21/18 0838  BP: (!) 117/57 123/68 116/68 (!) 106/59  Pulse: 67 63 63 (!) 58  Resp: 18 20 20    Temp: 98.3 F (36.8 C) 97.9 F (36.6 C) 98.7 F (37.1 C) 98.3 F (36.8 C)  TempSrc: Oral Oral Oral Oral  SpO2: 99% 100% 99% 98%  Weight:   120.3 kg   Height:        Intake/Output Summary (Last 24 hours) at 08/21/2018 0839 Last data filed at 08/21/2018 0700 Gross per 24 hour  Intake 660 ml  Output 1425 ml  Net -765 ml   Last 3 Weights 08/21/2018 08/20/2018 08/19/2018  Weight (lbs) 265 lb 4.8 oz 269 lb 3.2 oz 274 lb 1.6 oz  Weight (kg) 120.339 kg 122.108 kg 124.331 kg      Telemetry    V paced at 75 bpm - Personally Reviewed  ECG    V paced- Personally Reviewed  Physical Exam   GEN: No acute distress. overweight Neck: No JVD Cardiac: RRR, soft 1/6 systolic murmur at apex.  Respiratory: clear GI: Soft, nontender, non-distended  MS: 2+ LE edema; less erythema.  No deformity. Neuro:  Nonfocal  Psych: Normal affect   Labs    Chemistry Recent Labs  Lab 08/19/18 1120 08/19/18 2010 08/20/18 0510 08/21/18 0431  NA 139  --  139 139  K 4.4  --  3.8 3.8  CL 109  --  106 106  CO2 22  --  22 25  GLUCOSE 99  --  58*  124*  BUN 21  --  23 26*  CREATININE 1.31* 1.30* 1.29* 1.35*  CALCIUM 9.3  --  9.1 8.9  PROT 7.8  --   --   --   ALBUMIN 3.4*  --   --   --   AST 36  --   --   --   ALT 24  --   --   --   ALKPHOS 152*  --   --   --   BILITOT 2.1*  --   --   --   GFRNONAA 54* 55* 55* 52*  GFRAA >60 >60 >60 >60  ANIONGAP 8  --  11 8     Hematology Recent Labs  Lab 08/19/18 1120 08/19/18 2010 08/20/18 0510  WBC 6.1 7.2 6.1  RBC 4.67 4.63 4.39  HGB 13.0 13.0 12.4*  HCT 40.1 39.2 36.6*  MCV 85.9 84.7 83.4  MCH 27.8 28.1 28.2  MCHC 32.4 33.2 33.9  RDW 19.9* 19.9* 19.8*  PLT 88* 92* 85*    Cardiac EnzymesNo results for input(s): TROPONINI in the last  168 hours. No results for input(s): TROPIPOC in the last 168 hours.   BNP Recent Labs  Lab 08/19/18 1120  BNP 1,285.6*     DDimer No results for input(s): DDIMER in the last 168 hours.   Radiology    Dg Chest Port 1 View  Result Date: 08/19/2018 CLINICAL DATA:  Left arm swelling.  Shortness of breath. EXAM: PORTABLE CHEST 1 VIEW COMPARISON:  03/02/2018. FINDINGS: Cardiac pacer with lead tips over the right atrium right ventricle. Cardiomegaly with pulmonary venous congestion bilateral interstitial prominence. Findings suggest CHF. No pleural effusion or pneumothorax. IMPRESSION: 1.  Cardiac pacer stable position. 2. Cardiomegaly with bilateral from interstitial prominence suggesting mild CHF. Electronically Signed   By: Marcello Moores  Register   On: 08/19/2018 11:30    Cardiac Studies   Echo IMPRESSIONS    1. The left ventricle has mildly reduced systolic function, with an ejection fraction of 45-50%. The cavity size was mild to moderately dilated. There is mildly increased left ventricular wall thickness. Left ventricular diastolic Doppler parameters are  consistent with pseudonormalization. Left ventrical global hypokinesis without regional wall motion abnormalities.  2. The right ventricle has normal systolic function. The cavity was  normal. There is no increase in right ventricular wall thickness.  3. Left atrial size was severely dilated.  4. The aortic valve is tricuspid. Mild thickening of the aortic valve. Moderate calcification of the aortic valve. Mild stenosis of the aortic valve.  5. The ascending aorta is normal in size and structure.  6. The inferior vena cava was dilated in size with <50% respiratory variability.  Patient Profile     73 y.o. male with past medical history of CHB (s/p PPM placement in 2017), paroxysmal atrial flutter (s/p ablation in 2012), paroxysmal atrial fibrillation (on Eliquis), chronic combined systolic and diastolic CHF (EF 62% in 1308), HTN, HLD, and mild AS who presents to [facility] on 08/21/2018 acute on chronic CHF exacerbation.   Assessment & Plan    1. Acute on chronic CHF exacerbation: - was on torsemide 40 mg bid. Was placed on metolazone but stopped the torsemide with significant subsequent weight gain. -Continue Lasix 25m BID for now and watch renal function closely. Potentially switch to po Torsemide in am   -Creatinine is stable. -Continue Kdur 276m and follow. Potassium is OK -BMET daily -Daily weight and strict I&O  -Weight, 278lb>>265, I/O negative 2 liters. Clinically improved.  -BNP, 1285 -CXR with pulmonary edema  -Echo showed EF 40-45% which is unchanged from prior.  2. Hx of CHB: -s/p PPM -Currently V paced  - suspect underlying Afib.  3. HTN: -Stable -Continue amlodipine, carvedilol  4. HLD: -LDL, 71 on 01/17/2018 -Continue Crestor  5. Hx of PAF s/p ablation 2012: -On Eliquis  -Currently V paced on EKG/telemetry   6. DM2: -SSI for glucose control while inpatient status   For questions or updates, please contact CHRobinettelease consult [email or web address] for contact info under        Signed,  JoMartiniqueMD  08/21/2018, 8:39 AM

## 2018-08-21 NOTE — TOC Initial Note (Signed)
Transition of Care [facility]) - Initial/Assessment Note    Patient Details  Name: John [name] MRN: [id number] Date of Birth: [birth year]  Transition of Care [facility]) CM/SW Contact:    Sherrilyn Rist Phone Number: [phone] 08/21/2018, 2:43 PM  Clinical Narrative:                 Patient lives at home with spouse; Primary Physician: Mikey Kirschner, MD; has private insurance with [facility] Medicare with prescription drug coverage; no needs identified at this time; CM will continue to follow for progression of care.  Expected Discharge Plan: Home/Self Care Barriers to Discharge: No Barriers Identified   Patient Goals and CMS Choice Patient states their goals for this hospitalization and ongoing recovery are:: to stay at home CMS Medicare.gov Compare Post Acute Care list provided to:: Patient Choice offered to / list presented to : NA  Expected Discharge Plan and Services Expected Discharge Plan: Home/Self Care In-house Referral: NA Discharge Planning Services: CM Consult Post Acute Care Choice: NA Living arrangements for the past 2 months: Single Family Home                 DME Arranged: N/A DME Agency: NA       HH Arranged: NA HH Agency: NA        Prior Living Arrangements/Services Living arrangements for the past 2 months: Single Family Home Lives with:: Spouse          Need for Family Participation in Patient Care: No (Comment) Care giver support system in place?: Yes (comment)      Activities of Daily Living      Permission Sought/Granted                  Emotional Assessment       Orientation: : Oriented to Self, Oriented to  Time, Oriented to Place, Oriented to Situation Alcohol / Substance Use: Not Applicable Psych Involvement: No (comment)  Admission diagnosis:  SOB  left arm swollen Patient Active Problem List   Diagnosis Date Noted  . CHF (congestive heart failure) ([place]) 08/19/2018  . Stage 3 chronic kidney disease ([place])    . Paroxysmal atrial fibrillation (HCC)   . Acute on chronic systolic CHF (congestive heart failure) ([place]) 03/02/2018  . Pacemaker 03/02/2018  . History of colonic polyps 12/28/2016  . Atrial pacemaker lead displacement 12/07/2015  . Acute CHF (congestive heart failure) ([place]) 11/21/2015  . CHF, acute on chronic ([place]) 11/20/2015  . Complete heart block ([place]) 11/20/2015  . Chronic diastolic CHF (congestive heart failure) ([place]) 08/28/2015  . Hypertensive heart disease 08/28/2015  . NSVT (nonsustained ventricular tachycardia) ([place]) 08/28/2015  . Elevated troponin 08/28/2015  . Second degree heart block 08/25/2015  . Bradycardia 08/25/2015  . Acute diastolic CHF (congestive heart failure) ([place]) 08/25/2015  . Current use of long term anticoagulation 05/04/2011  . Atrial flutter ([place]) 01/05/2011  . DM type 2 causing vascular disease ([place]) 12/23/2009  . Mixed hyperlipidemia 12/23/2009  . Essential hypertension 12/23/2009  . DEGENERATIVE JOINT DISEASE 12/23/2009   PCP:  Mikey Kirschner, MD Pharmacy:   [id number] [place] [address]Phone: [phone]: 3[facility] [address] [place]Phone: [phone]: [phone]    Social Determinants of Health (SDOH) Interventions    Readmission Risk Interventions No flowsheet data  found.

## 2018-08-21 NOTE — Progress Notes (Signed)
Patient has 9 beats of Vtach this am, patient was asymptomatic, Cardiology  PA notified , no new order given. Will continue to monitor the patient.

## 2018-08-21 NOTE — Progress Notes (Signed)
Inpatient Diabetes Program Recommendations  AACE/ADA: New Consensus Statement on Inpatient Glycemic Control   Target Ranges:  Prepandial:   less than 140 mg/dL      Peak postprandial:   less than 180 mg/dL (1-2 hours)      Critically ill patients:  140 - 180 mg/dL   Results for John [name], FORSE (MRN [id number]) as of 08/21/2018 10:50  Ref. Range 08/20/2018 05:59 08/20/2018 06:22 08/20/2018 11:38 08/20/2018 16:38 08/20/2018 17:08 08/20/2018 21:04 08/21/2018 06:21  Glucose-Capillary Latest Ref Range: 70 - 99 mg/dL 52 (L) 84 129 (H)  Novolog 2 units 60 (L) 79 128 (H) 101 (H)   Review of Glycemic Control  Diabetes history: DM2 Outpatient Diabetes medications: Tresiba 60 units QHS, Metformin 324 mg BID, Trulicity 1.5 mg Qweek Current orders for Inpatient glycemic control: Novolog 0-15 unit TID with meals  Inpatient Diabetes Program Recommendations:   Insulin-Correction: Please consider decreasing Novolog correction to 0-9 units TID with meals.  HgbA1C: A1C 6.8% on 08/19/18 indicating an average glucose of 148 mg/dl over the past 2-3 months.  NOTE: In reviewing chart, noted patient is followed by Dr. Dorris Fetch (Endocrinologist) and was last seen on 06/18/18. Noted fasting glucose 52 mg/dl at 5:59 am on 08/20/18. Patient was given Novolog 2 units for correction of 129 mg/dl and following glucose down to 60 mg/dl at 16:38 on 08/20/18. Recommend decreasing Novolog correction to sensitive scale.   Thanks, Barnie Alderman, RN, MSN, CDE Diabetes Coordinator Inpatient Diabetes Program [phone] (Team Pager from 8am to 5pm)

## 2018-08-22 LAB — BASIC METABOLIC PANEL
Anion gap: 10 (ref 5–15)
BUN: 26 mg/dL — ABNORMAL HIGH (ref 8–23)
CO2: 25 mmol/L (ref 22–32)
Calcium: 8.9 mg/dL (ref 8.9–10.3)
Chloride: 102 mmol/L (ref 98–111)
Creatinine, Ser: 1.32 mg/dL — ABNORMAL HIGH (ref 0.61–1.24)
GFR calc Af Amer: >60 mL/min (ref >60)
GFR calc non Af Amer: 54 mL/min — ABNORMAL LOW (ref >60)
Glucose, Bld: 103 mg/dL — ABNORMAL HIGH (ref 70–99)
Potassium: 4 mmol/L (ref 3.5–5.1)
Sodium: 137 mmol/L (ref 135–145)

## 2018-08-22 LAB — GLUCOSE, CAPILLARY
Glucose-Capillary: 94 mg/dL (ref 70–99)
Glucose-Capillary: 94 mg/dL (ref 70–99)
Glucose-Capillary: 159 mg/dL — ABNORMAL HIGH (ref 70–99)

## 2018-08-22 MED ORDER — METOLAZONE 2.5 MG PO TABS
2.5000 mg | ORAL_TABLET | ORAL | Status: DC
  Administered 2018-08-22: 09:00:00 2.5 mg via ORAL
  Filled 2018-08-22: qty 1

## 2018-08-22 MED ORDER — TORSEMIDE 20 MG PO TABS
40.0000 mg | ORAL_TABLET | Freq: Two times a day (BID) | ORAL | Status: DC
  Administered 2018-08-22: 40 mg via ORAL
  Filled 2018-08-22: qty 2

## 2018-08-22 MED ORDER — METOLAZONE 2.5 MG PO TABS: 2.5000 mg | ORAL_TABLET | ORAL | 0 refills | Status: DC

## 2018-08-22 MED ORDER — TORSEMIDE 20 MG PO TABS: 40.0000 mg | ORAL_TABLET | Freq: Two times a day (BID) | ORAL | 1 refills | Status: DC

## 2018-08-22 MED FILL — TORSEMIDE 20 MG TABLET: 20 | 30 days supply | Qty: 120 | Fill #0

## 2018-08-22 NOTE — Progress Notes (Signed)
Pt is ambulating independently, denies shortness of breath. Discharge instructions given to pt, discharged to home picked up by wife.

## 2018-08-22 NOTE — Discharge Summary (Signed)
Discharge Summary    Patient ID: John [name],  MRN: [id number], DOB/AGE: 1946-02-01 73 y.o.  Admit date: 08/19/2018 Discharge date: 08/22/2018  Primary Care Provider: Mikey Kirschner Primary Cardiologist: Dr. Bronson Ing  Discharge Diagnoses    Principal Problem:   Acute on chronic systolic CHF (congestive heart failure) [facility]) Active Problems:   Essential hypertension   CHF (congestive heart failure) ([place])   Allergies No Known Allergies  Diagnostic Studies/Procedures    TTE: 08/20/18  IMPRESSIONS    1. The left ventricle has mildly reduced systolic function, with an ejection fraction of 45-50%. The cavity size was mild to moderately dilated. There is mildly increased left ventricular wall thickness. Left ventricular diastolic Doppler parameters are  consistent with pseudonormalization. Left ventrical global hypokinesis without regional wall motion abnormalities.  2. The right ventricle has normal systolic function. The cavity was normal. There is no increase in right ventricular wall thickness.  3. Left atrial size was severely dilated.  4. The aortic valve is tricuspid. Mild thickening of the aortic valve. Moderate calcification of the aortic valve. Mild stenosis of the aortic valve.  5. The ascending aorta is normal in size and structure.  6. The inferior vena cava was dilated in size with <50% respiratory variability. _____________   History of Present Illness     John [name] a 72 y.o.malewith past medical history of CHB (s/p PPM placement in 2017), paroxysmal atrial flutter (s/p ablation in 2012), paroxysmal atrial fibrillation (on Eliquis), chronic combined systolic and diastolic CHF (EF 58% in 8502), HTN, HLD, and mild AS who presented to [facility] on 08/21/2018 acute on chronic CHF exacerbation.   He had a phone visit with Dr. Bronson Ing on 07/22/2018 and reported worsening dyspnea on exertion and lower extremity edema with weight elevated at 272 lbs  (previous baseline of 260 - 262 lbs). He was continued on Torsemide 32m BID with repeat labs recommended. BNP on 4/29 was at 1098 with BMET showing K+ at 3.9 and creatinine 1.18.  He was then seen by BBernerd Pho PMobridgeon 08/05/2018 for 2 week follow up. At that time he reported symptom improvement in his lower extremity edema and dyspnea over the past week. He reported that he was able to walk to his mailbox and garden without significant symptoms. He had started to utilize compression stockings on a daily basis. Has baseline 3-pillow orthopnea whichwas unchanged. Weight had improved on his home scales, previously peaking at 276 lbs, now at 271 lbs. Denied any recent chest pain or palpitations. Reported good compliance with Eliquis and denies any recent melena, hematochezia, or hematuria.  He was then seen in follow up in which the patient had gained 6lb and was changed from Torsemide 421mBID to Metalazone 2.17m14mvery other day. He then noticed his weight gain with associated SOB. He had significant LE swelling and crackles on lung exam. He denied chest pain or other anginal symptoms. He reported diet and medication compliance.   Hospital Course     1. Acute on chronic CHF exacerbation: BNP was 1285 on admission and CXR with edema. Started on IV lasix with excellent diuresis. Weight trending down from 278lb>>262, I/O negative 3.4 liters. Clinically improved.  -Echo showed EF 40-45% which is unchanged from prior. - will resume Torsemide 40 mg bid today po. Recommend he take metolazone 2.5 mg every other day. Instructed to follow weights at home and call for weight gain >3lb in a day or 5lbs in a week.  2. Hx of CHB: s/p PPM - Vpaced this admission  3. HTN: Stable throughout admission. -Continued amlodipine, and carvedilol. ACEi held at the time of discharge 2/2 to Cr 1.3  4. HLD: LDL, 71 on 01/17/2018 -Continue Crestor  5. Hx of PAF s/p ablation 2012: On Eliquis 67m BID. -Currently  V paced on EKG/telemetry   6. DM2: - on home insulin with metformin resumed at discharge.   WRoddie Mcwas seen by Dr. JMartiniqueand determined stable for discharge home. Follow up in the office has been arranged. Arranged for an in office visit 2/2 to risk of decompensation. Medications are listed below.   _____________  Discharge Vitals Blood pressure 114/69, pulse 64, temperature 98.1 F (36.7 C), temperature source Oral, resp. rate 18, height 5' 11"  (1.803 m), weight 119.1 kg, SpO2 100 %.  Filed Weights   08/21/18 0533 08/22/18 0411 08/22/18 0527  Weight: 120.3 kg 119.1 kg 119.1 kg    Labs & Radiologic Studies    CBC Recent Labs    08/19/18 2010 08/20/18 0510  WBC 7.2 6.1  NEUTROABS  --  3.3  HGB 13.0 12.4*  HCT 39.2 36.6*  MCV 84.7 83.4  PLT 92* 85*   Basic Metabolic Panel Recent Labs    08/21/18 0431 08/22/18 0430  NA 139 137  K 3.8 4.0  CL 106 102  CO2 25 25  GLUCOSE 124* 103*  BUN 26* 26*  CREATININE 1.35* 1.32*  CALCIUM 8.9 8.9   Liver Function Tests No results for input(s): AST, ALT, ALKPHOS, BILITOT, PROT, ALBUMIN in the last 72 hours. No results for input(s): LIPASE, AMYLASE in the last 72 hours. Cardiac Enzymes No results for input(s): CKTOTAL, CKMB, CKMBINDEX, TROPONINI in the last 72 hours. BNP Invalid input(s): POCBNP D-Dimer No results for input(s): DDIMER in the last 72 hours. Hemoglobin A1C Recent Labs    08/19/18 2010  HGBA1C 6.8*   Fasting Lipid Panel No results for input(s): CHOL, HDL, LDLCALC, TRIG, CHOLHDL, LDLDIRECT in the last 72 hours. Thyroid Function Tests No results for input(s): TSH, T4TOTAL, T3FREE, THYROIDAB in the last 72 hours.  Invalid input(s): FREET3 _____________  Dg Chest Port 1 View  Result Date: 08/19/2018 CLINICAL DATA:  Left arm swelling.  Shortness of breath. EXAM: PORTABLE CHEST 1 VIEW COMPARISON:  03/02/2018. FINDINGS: Cardiac pacer with lead tips over the right atrium right ventricle. Cardiomegaly  with pulmonary venous congestion bilateral interstitial prominence. Findings suggest CHF. No pleural effusion or pneumothorax. IMPRESSION: 1.  Cardiac pacer stable position. 2. Cardiomegaly with bilateral from interstitial prominence suggesting mild CHF. Electronically Signed   By: TMarcello Moores Register   On: 08/19/2018 11:30   Disposition   Pt is being discharged home today in good condition.  Follow-up Plans & Appointments    Follow-up Information    IIsaiah Serge NP Follow up on 08/29/2018.   Specialties:  Cardiology, Radiology Why:  at 1:45pm for your follow up appt.  Contact information: [address] [place] [phone]         Discharge Instructions    (HEART FAILURE PATIENTS) Call MD:  Anytime you have any of the following symptoms: 1) 3 pound weight gain in 24 hours or 5 pounds in 1 week 2) shortness of breath, with or without a dry hacking cough 3) swelling in the hands, feet or stomach 4) if you have to sleep on extra pillows at night in order to breathe.   Complete by:  As  directed    Diet - low sodium heart healthy   Complete by:  As directed    Discharge instructions   Complete by:  As directed    For patients with congestive heart failure, we give them these special instructions:  1. Follow a low-salt diet and watch your fluid intake. In general, you should not be taking in more than 2 liters of fluid per day (no more than 8 glasses per day). Some patients are restricted to less than 1.5 liters of fluid per day (no more than 6 glasses per day). This includes sources of water in foods like soup, coffee, tea, milk, etc. 2. Weigh yourself on the same scale at same time of day and keep a log. 3. Call your doctor: (Anytime you feel any of the following symptoms)  - 3-4 pound weight gain in 1-2 days or 2 pounds overnight  - Shortness of breath, with or without a dry hacking cough  - Swelling in the hands, feet or stomach  - If you have to sleep on  extra pillows at night in order to breathe   IT IS IMPORTANT TO LET YOUR DOCTOR KNOW EARLY ON IF YOU ARE HAVING SYMPTOMS SO WE CAN HELP YOU!   Increase activity slowly   Complete by:  As directed        Discharge Medications     Medication List    STOP taking these medications   benazepril 40 MG tablet Commonly known as:  LOTENSIN   tamsulosin 0.4 MG Caps capsule Commonly known as:  FLOMAX     TAKE these medications   amLODipine 10 MG tablet Commonly known as:  NORVASC Take 1 tablet (10 mg total) by mouth daily. One daily   benzonatate 100 MG capsule Commonly known as:  Tessalon Perles Take 1 capsule (100 mg total) by mouth every 6 (six) hours as needed for cough.   carvedilol 25 MG tablet Commonly known as:  COREG TAKE 1 TABLET (25 MG TOTAL) BY MOUTH 2 (TWO) TIMES DAILY WITH A MEAL.   Eliquis 5 MG Tabs tablet Generic drug:  apixaban TAKE 1 TABLET TWICE DAILY What changed:  how much to take   glucose blood test strip 1 each by Other route as needed. Use as instructed bid   Insulin Degludec 200 UNIT/ML Sopn Commonly known as:  [place] FlexTouch Inject 60 Units into the skin at bedtime.   Insulin Pen Needle 31G X 8 MM Misc USE EVERY DAY AS DIRECTED BY PHYSICIAN   Magnesium 400 MG Caps Take 400 mg by mouth daily.   metFORMIN 500 MG tablet Commonly known as:  GLUCOPHAGE Take 2 tablets (1,000 mg total) by mouth 2 (two) times daily with a meal. What changed:  how much to take   metolazone 2.5 MG tablet Commonly known as:  ZAROXOLYN Take 1 tablet (2.5 mg total) by mouth every other day. Start taking on:  Aug 24, 2018 What changed:    when to take this  reasons to take this   multivitamin with minerals tablet Take 1 tablet by mouth daily.   Potassium Chloride ER 20 MEQ Tbcr Take 10 mEq by mouth daily as needed. Take when taking metolazone.   Resveratrol 100 MG Caps Take 100 mg by mouth at bedtime.   rosuvastatin 20 MG tablet Commonly known as:   CRESTOR Take 1 tablet (20 mg total) by mouth daily.   torsemide 20 MG tablet Commonly known as:  DEMADEX Take 2 tablets (40 mg  total) by mouth 2 (two) times daily.   Trulicity 1.5 SU/1.9RV Sopn Generic drug:  Dulaglutide INJECT  1.5MG (CONTENTS OF 1 PEN)  INTO  THE  SKIN ONE TIME WEEKLY What changed:  See the new instructions.        Acute coronary syndrome (MI, NSTEMI, STEMI, etc) this admission?: No.     Outstanding Labs/Studies   BMET at follow up appt.   Duration of Discharge Encounter   Greater than 30 minutes including physician time.  Signed, Reino Bellis NP-C 08/22/2018, 1:48 PM

## 2018-08-22 NOTE — Care Management Important Message (Signed)
Important Message  Patient Details  Name: John [name] MRN: [id number] Date of Birth: 1945/11/24   Medicare Important Message Given:  Yes    Orbie Pyo 08/22/2018, 1:04 PM

## 2018-08-22 NOTE — Progress Notes (Signed)
Progress Note  Patient Name: John [name] Date of Encounter: 08/22/2018  Primary Cardiologist: [name], MD   Subjective   Breathing is much better. Good response to IV lasix. Less  edema. Ambulating.   Inpatient Medications    Scheduled Meds: . amLODipine  10 mg Oral Daily  . apixaban  5 mg Oral BID  . carvedilol  25 mg Oral BID WC  . furosemide  40 mg Intravenous BID  . insulin aspart  0-15 Units Subcutaneous TID WC  . potassium chloride  20 mEq Oral BID  . rosuvastatin  20 mg Oral Daily  . sodium chloride flush  3 mL Intravenous Q12H   Continuous Infusions: . sodium chloride     PRN Meds: sodium chloride, acetaminophen, ondansetron (ZOFRAN) IV, sodium chloride flush   Vital Signs    Vitals:   08/21/18 2051 08/22/18 0411 08/22/18 0527 08/22/18 0533  BP: 125/74   114/67  Pulse: 71   68  Resp: 18   20  Temp:    98.1 F (36.7 C)  TempSrc: Oral   Oral  SpO2: 97%   97%  Weight:  119.1 kg 119.1 kg   Height:        Intake/Output Summary (Last 24 hours) at 08/22/2018 0727 Last data filed at 08/22/2018 0659 Gross per 24 hour  Intake 1200 ml  Output 2450 ml  Net -1250 ml   Last 3 Weights 08/22/2018 08/22/2018 08/21/2018  Weight (lbs) 262 lb 8 oz 262 lb 9.6 oz 265 lb 4.8 oz  Weight (kg) 119.069 kg 119.115 kg 120.339 kg      Telemetry    V paced at 75 bpm - Personally Reviewed  ECG    V paced- Personally Reviewed  Physical Exam   GEN: No acute distress. overweight Neck: No JVD Cardiac: RRR, soft 1/6 systolic murmur at apex.  Respiratory: clear GI: Soft, nontender, non-distended  MS: 1+ pitting LE edema; less erythema.  No deformity. Neuro:  Nonfocal  Psych: Normal affect   Labs    Chemistry Recent Labs  Lab 08/19/18 1120  08/20/18 0510 08/21/18 0431 08/22/18 0430  NA 139  --  139 139 137  K 4.4  --  3.8 3.8 4.0  CL 109  --  106 106 102  CO2 22  --  22 25 25   GLUCOSE 99  --  58* 124* 103*  BUN 21  --  23 26* 26*  CREATININE  1.31*   < > 1.29* 1.35* 1.32*  CALCIUM 9.3  --  9.1 8.9 8.9  PROT 7.8  --   --   --   --   ALBUMIN 3.4*  --   --   --   --   AST 36  --   --   --   --   ALT 24  --   --   --   --   ALKPHOS 152*  --   --   --   --   BILITOT 2.1*  --   --   --   --   GFRNONAA 54*   < > 55* 52* 54*  GFRAA >60   < > >60 >60 >60  ANIONGAP 8  --  11 8 10    < > = values in this interval not displayed.     Hematology Recent Labs  Lab 08/19/18 1120 08/19/18 2010 08/20/18 0510  WBC 6.1 7.2 6.1  RBC 4.67 4.63 4.39  HGB 13.0 13.0 12.4*  HCT 40.1 39.2 36.6*  MCV 85.9 84.7 83.4  MCH 27.8 28.1 28.2  MCHC 32.4 33.2 33.9  RDW 19.9* 19.9* 19.8*  PLT 88* 92* 85*    Cardiac EnzymesNo results for input(s): TROPONINI in the last 168 hours. No results for input(s): TROPIPOC in the last 168 hours.   BNP Recent Labs  Lab 08/19/18 1120  BNP 1,285.6*     DDimer No results for input(s): DDIMER in the last 168 hours.   Radiology    No results found.  Cardiac Studies   Echo IMPRESSIONS    1. The left ventricle has mildly reduced systolic function, with an ejection fraction of 45-50%. The cavity size was mild to moderately dilated. There is mildly increased left ventricular wall thickness. Left ventricular diastolic Doppler parameters are  consistent with pseudonormalization. Left ventrical global hypokinesis without regional wall motion abnormalities.  2. The right ventricle has normal systolic function. The cavity was normal. There is no increase in right ventricular wall thickness.  3. Left atrial size was severely dilated.  4. The aortic valve is tricuspid. Mild thickening of the aortic valve. Moderate calcification of the aortic valve. Mild stenosis of the aortic valve.  5. The ascending aorta is normal in size and structure.  6. The inferior vena cava was dilated in size with <50% respiratory variability.  Patient Profile     73 y.o. male with past medical history of CHB (s/p PPM placement in  2017), paroxysmal atrial flutter (s/p ablation in 2012), paroxysmal atrial fibrillation (on Eliquis), chronic combined systolic and diastolic CHF (EF 26% in 3335), HTN, HLD, and mild AS who presents to [facility] on 08/21/2018 acute on chronic CHF exacerbation.   Assessment & Plan    1. Acute on chronic CHF exacerbation: - was on torsemide 40 mg bid. Was placed on metolazone but stopped the torsemide with significant subsequent weight gain. -excellent diuresis  -Creatinine is stable. -Continue Kdur 56mq and follow. Potassium is OK -BMET daily -Daily weight and strict I&O  -Weight, 278lb>>262, I/O negative 3.4 liters. Clinically improved.  -BNP, 1285 -CXR with pulmonary edema  -Echo showed EF 40-45% which is unchanged from prior. - will resume Torsemide 40 mg bid today po. Recommend he take metolazone 2.5 mg every other day. Will plan DC home today. He will need TOC follow up visit and BMET in one week  2. Hx of CHB: -s/p PPM -Currently V paced  - suspect underlying Afib.  3. HTN: -Stable -Continue amlodipine, carvedilol  4. HLD: -LDL, 71 on 01/17/2018 -Continue Crestor  5. Hx of PAF s/p ablation 2012: -On Eliquis  -Currently V paced on EKG/telemetry   6. DM2: -SSI for glucose control while inpatient status   For questions or updates, please contact [place] consult [email or web address] for contact info under        Signed, Selim Durden JMartinique MD  08/22/2018, 7:27 AM

## 2018-08-26 ENCOUNTER — Ambulatory Visit: Payer: Medicare HMO | Admitting: Cardiovascular Disease

## 2018-08-28 ENCOUNTER — Telehealth: Payer: Self-pay | Admitting: *Deleted

## 2018-08-28 NOTE — Progress Notes (Signed)
Cardiology Office Note   Date:  08/29/2018   ID:  John [name], DOB January 12, 1946, MRN [id number]   PCP:  Mikey Kirschner, MD  Cardiologist:  Dr. Jacinta Shoe     Chief Complaint  Patient presents with  . Congestive Heart Failure  . Hospitalization Follow-up      History of Present Illness: John [name] is a 73 y.o. male who presents for post hospitalization.   He has a past medical history of CHB (s/p PPM placement in 2017), paroxysmal atrial flutter (s/p ablation in 2012), paroxysmal atrial fibrillation (on Eliquis), chronic combined systolic and diastolic CHF (EF 29% in 7989), HTN, HLD, and mild AS who [facility] on 08[facility] acute on chronic CHF exacerbation.  Meds were adjusted as outpt but still with wt gain and increased dyspnea- was in acute on chronic systolic HF.   Pt had not continued with torsemide when metolazone added.   Diureses and wt down from 278 lbs to 262 lbs and was neg. 3.4 L,  New Echo 08/20/18 with EF 40-45% which was unchanged from prior, discharged with torsemide 40 mg BID, and metolzone 2.5 mg every other day.   He was pacing in hospital.    HTN controlled.  Hx PAF s/p ablation 2012.  On eliquis  On home insulin.   Today he is feeling good no chest pain and no SOB.  His wt on his scales initially post discharge 256 lbs and today home scales at 253 lbs.  He continues with lower ext edema.  Just above sock line and below.  He is watching his salt but does find it hard to do.  BP is great.      Past Medical History:  Diagnosis Date  . Atrial flutter ([place]) 12/2010   Admitted with symptomatic bradycardia (HR 40s), atrial flutter with slow ventricular response 12/2010 + volume overload; AV nodal agents d/c'd and Pradaxa started; RFA in 01/2011  . CHF (congestive heart failure) ([place])   . Chronic combined systolic and diastolic heart failure (HCC)    echo 01/06/11: mild LVH, EF 65-70%, mod to severe LAE, mild RVE, mild RAE, PASP 32;   TEE 10/12: EF  45-50%   . Class 2 severe obesity due to excess calories with serious comorbidity and body mass index (BMI) of 37.0 to 37.9 in adult ([place]) 07/17/2017  . Diabetes mellitus    non insulin dependant  . Hyperlipidemia   . Hypertension   . Osteoarthritis   . Presence of permanent cardiac pacemaker   . PSVT (paroxysmal supraventricular tachycardia) (HCC)    Possibly atrial flutter    Past Surgical History:  Procedure Laterality Date  . ATRIAL FLUTTER ABLATION N/A 02/07/2011   Procedure: ATRIAL FLUTTER ABLATION;  Surgeon: Evans Lance, MD;  Location[facility] CATH LAB;  Service: Cardiovascular;  Laterality: N/A;  . CARDIAC ELECTROPHYSIOLOGY STUDY AND ABLATION  02/07/11  . CARDIOVERSION N/A 03/23/2016   Procedure: CARDIOVERSION;  Surgeon: Evans Lance, MD;  Location: [place];  Service: Cardiovascular;  Laterality: N/A;  . COLONOSCOPY N/A 04/17/2017   Procedure: COLONOSCOPY;  Surgeon: Rogene Houston, MD;  [facility];  Service: Endoscopy;  Laterality: N/A;  830  . EP IMPLANTABLE DEVICE N/A 08/29/2015   Procedure: Pacemaker Implant;  Surgeon: Evans Lance, MD;  Location: [place] CV LAB;  Service: Cardiovascular;  Laterality: N/A;  . EP IMPLANTABLE DEVICE N/A 12/07/2015   Procedure: PPM Lead Revision/Repair;  Surgeon: Evans Lance, MD;  Location: [facility]  INVASIVE CV LAB;  Service: Cardiovascular;  Laterality: N/A;  . KNEE ARTHROSCOPY  2005   left  . LUMBAR SPINE SURGERY     "I've had 6 ORs 1972 thru 2004"  . POLYPECTOMY  04/17/2017   Procedure: POLYPECTOMY;  Surgeon: Rogene Houston, MD;  [facility];  Service: Endoscopy;;  transverse colon x3;     Current Outpatient Medications  Medication Sig Dispense Refill  . amLODipine (NORVASC) 10 MG tablet Take 1 tablet (10 mg total) by mouth daily. One daily 90 tablet 1  . benzonatate (TESSALON PERLES) 100 MG capsule Take 1 capsule (100 mg total) by mouth every 6 (six) hours as needed for cough. 30 capsule 2  . carvedilol  (COREG) 25 MG tablet TAKE 1 TABLET (25 MG TOTAL) BY MOUTH 2 (TWO) TIMES DAILY WITH A MEAL. 180 tablet 3  . ELIQUIS 5 MG TABS tablet TAKE 1 TABLET TWICE DAILY 180 tablet 3  . glucose blood test strip 1 each by Other route as needed. Use as instructed bid 100 each 3  . Insulin Degludec (TRESIBA FLEXTOUCH) 200 UNIT/ML SOPN Inject 60 Units into the skin at bedtime. 4 pen 2  . Insulin Pen Needle 31G X 8 MM MISC USE EVERY DAY AS DIRECTED BY PHYSICIAN 100 each 4  . Magnesium 400 MG CAPS Take 400 mg by mouth daily.     . metFORMIN (GLUCOPHAGE) 500 MG tablet Take 500 mg by mouth 2 (two) times daily with a meal.    . metolazone (ZAROXOLYN) 2.5 MG tablet Take 1 tablet (2.5 mg total) by mouth every other day. 30 tablet 0  . Multiple Vitamins-Minerals (MULTIVITAMIN WITH MINERALS) tablet Take 1 tablet by mouth daily.    . potassium chloride 20 MEQ TBCR Take 10 mEq by mouth daily as needed. Take when taking metolazone. 15 tablet 0  . Resveratrol 100 MG CAPS Take 100 mg by mouth at bedtime.     . rosuvastatin (CRESTOR) 20 MG tablet Take 1 tablet (20 mg total) by mouth daily. 90 tablet 1  . torsemide (DEMADEX) 20 MG tablet Take 2 tablets (40 mg total) by mouth 2 (two) times daily. 120 tablet 1  . TRULICITY 1.5 FE/0.7HQ SOPN INJECT  1.5MG (CONTENTS OF 1 PEN)  INTO  THE  SKIN ONE TIME WEEKLY 6 mL 0   No current facility-administered medications for this visit.     Allergies:   Patient has no known allergies.    Social History:  The patient  reports that he quit smoking about 32 years ago. His smoking use included cigarettes. He has a 10.00 pack-year smoking history. He has never used smokeless tobacco. He reports current alcohol use. He reports that he does not use drugs.   Family History:  The patient's family history includes Heart attack in his brother, father, and mother; Hypertension in his father and mother.    ROS:  General:no colds or fevers, no weight changes Skin:no rashes or ulcers HEENT:no  blurred vision, no congestion CV:see HPI PUL:see HPI GI:no diarrhea constipation or melena, no indigestion GU:no hematuria, no dysuria MS:no joint pain, no claudication Neuro:no syncope, no lightheadedness Endo:no diabetes, no thyroid disease Wt Readings from Last 3 Encounters:  08/29/18 256 lb 12.8 oz (116.5 kg)  08/22/18 262 lb 8 oz (119.1 kg)  08/05/18 271 lb (122.9 kg)     PHYSICAL EXAM: VS:  BP 118/60   Pulse 66   Ht 5' 11"  (1.803 m)   Wt 256 lb 12.8 oz (  116.5 kg)   SpO2 99%   BMI 35.82 kg/m  , BMI Body mass index is 35.82 kg/m. General:Pleasant affect, NAD Skin:Warm and dry, brisk capillary refill HEENT:normocephalic, sclera clear, mucus membranes moist Neck:supple, no JVD, no bruits  Heart:S1S2 RRR without murmur, gallup, rub or click Lungs:clear without rales, rhonchi, or wheezes TUU:EKCM, non tender, + BS, do not palpate liver spleen or masses Ext:no lower ext edema, 2+ pedal pulses, 2+ radial pulses Neuro:alert and oriented, MAE, follows commands, + facial symmetry    EKG:  EKG is NOT ordered today.   Recent Labs: 08/19/2018: ALT 24; B Natriuretic Peptide 1,285.6 08/20/2018: Hemoglobin 12.4; Platelets 85 08/22/2018: BUN 26; Creatinine, Ser 1.32; Potassium 4.0; Sodium 137    Lipid Panel    Component Value Date/Time   CHOL 142 01/17/2018 0835   TRIG 90 01/17/2018 0835   HDL 53 01/17/2018 0835   CHOLHDL 2.7 01/17/2018 0835   CHOLHDL 3.2 11/16/2013 0702   VLDL 45 (H) 11/16/2013 0702   LDLCALC 71 01/17/2018 0835       Other studies Reviewed: Additional studies/ records that were reviewed today include:   Echo 08/20/18 IMPRESSIONS    1. The left ventricle has mildly reduced systolic function, with an ejection fraction of 45-50%. The cavity size was mild to moderately dilated. There is mildly increased left ventricular wall thickness. Left ventricular diastolic Doppler parameters are  consistent with pseudonormalization. Left ventrical global  hypokinesis without regional wall motion abnormalities.  2. The right ventricle has normal systolic function. The cavity was normal. There is no increase in right ventricular wall thickness.  3. Left atrial size was severely dilated.  4. The aortic valve is tricuspid. Mild thickening of the aortic valve. Moderate calcification of the aortic valve. Mild stenosis of the aortic valve.  5. The ascending aorta is normal in size and structure.  6. The inferior vena cava was dilated in size with <50% respiratory variability.  FINDINGS  Left Ventricle: The left ventricle has mildly reduced systolic function, with an ejection fraction of 45-50%. The cavity size was mild to moderately dilated. There is mildly increased left ventricular wall thickness. Left ventricular diastolic Doppler  parameters are consistent with pseudonormalization. Left ventrical global hypokinesis without regional wall motion abnormalities.  Right Ventricle: The right ventricle has normal systolic function. The cavity was normal. There is no increase in right ventricular wall thickness.  Left Atrium: Left atrial size was severely dilated.  Right Atrium: Right atrial size was normal in size. Right atrial pressure is estimated at 10 mmHg.  Interatrial Septum: No atrial level shunt detected by color flow Doppler.  Pericardium: There is no evidence of pericardial effusion.  Mitral Valve: The mitral valve is normal in structure. Mitral valve regurgitation is mild by color flow Doppler.  Tricuspid Valve: The tricuspid valve is normal in structure. Tricuspid valve regurgitation is mild by color flow Doppler.  Aortic Valve: The aortic valve is tricuspid Mild thickening of the aortic valve. Moderate calcification of the aortic valve. Aortic valve regurgitation was not visualized by color flow Doppler. There is Mild stenosis of the aortic valve, with a  calculated valve area of 0.97 cm.  Pulmonic Valve: The pulmonic valve  was normal in structure. Pulmonic valve regurgitation is trivial by color flow Doppler. No evidence of pulmonic stenosis.  Aorta: The ascending aorta is normal in size and structure.  Venous: The inferior vena cava is dilated in size with less than 50% respiratory variability.  ASSESSMENT AND PLAN:  1.  Acute systolic CHF improved.  Wt continues to decline  But lower ext edema continues.  Will check BMP today to help guide diuretic.  No dyspnea.  Follow up in 2 -3 weeks in [place].    2.  HTN   3.  Persistent/permanent  a fib stable anticoagulation on eliquis  4.  PPM  Stable.    Current medicines are reviewed with the patient today.  The patient Has no concerns regarding medicines.  The following changes have been made:  See above Labs/ tests ordered today include:see above  Disposition:   FU:  see above  Signed, Cecilie Kicks, NP  08/29/2018 2:21 PM    [facility] [address], [place], [place], [place] Phone: [phone]; Fax: [phone]

## 2018-08-28 NOTE — Telephone Encounter (Signed)
Covid prescreen questions.      COVID-19 Pre-Screening Questions:  . In the past 7 to 10 days have you had a cough,  shortness of breath, headache, congestion, fever (100 or greater) body aches, chills, sore throat, or sudden loss of taste or sense of smell? . Have you been around anyone with known Covid 19. . Have you been around anyone who is awaiting Covid 19 test results in the past 7 to 10 days? . Have you been around anyone who has been exposed to Covid 19, or has mentioned symptoms of Covid 19 within the past 7 to 10 days?  If you have any concerns/questions about symptoms patients report during screening (either on the phone or at threshold). Contact the provider seeing the patient or DOD for further guidance.  If neither are available contact a member of the leadership team.

## 2018-08-29 ENCOUNTER — Encounter: Payer: Self-pay | Admitting: Cardiology

## 2018-08-29 ENCOUNTER — Other Ambulatory Visit: Payer: Self-pay

## 2018-08-29 ENCOUNTER — Ambulatory Visit (INDEPENDENT_AMBULATORY_CARE_PROVIDER_SITE_OTHER): Payer: Medicare HMO | Admitting: Cardiology

## 2018-08-29 VITALS — BP 118/60 | HR 66 | Ht 71.0 in | Wt 256.8 lb

## 2018-08-29 DIAGNOSIS — I10 Essential (primary) hypertension: Secondary | ICD-10-CM | POA: Diagnosis not present

## 2018-08-29 DIAGNOSIS — I5021 Acute systolic (congestive) heart failure: Secondary | ICD-10-CM

## 2018-08-29 DIAGNOSIS — I4819 Other persistent atrial fibrillation: Secondary | ICD-10-CM

## 2018-08-29 DIAGNOSIS — Z950 Presence of cardiac pacemaker: Secondary | ICD-10-CM

## 2018-08-29 DIAGNOSIS — I5042 Chronic combined systolic (congestive) and diastolic (congestive) heart failure: Secondary | ICD-10-CM | POA: Diagnosis not present

## 2018-08-29 DIAGNOSIS — I38 Endocarditis, valve unspecified: Secondary | ICD-10-CM | POA: Diagnosis not present

## 2018-08-29 NOTE — Patient Instructions (Signed)
Medication Instructions:  Your physician recommends that you continue on your current medications as directed. Please refer to the Current Medication list given to you today.  If you need a refill on your cardiac medications before your next appointment, please call your pharmacy.   Lab work: TODAY: BMET  If you have labs (blood work) drawn today and your tests are completely normal, you will receive your results only by: Marland Kitchen MyChart Message (if you have MyChart) OR . A paper copy in the mail If you have any lab test that is abnormal or we need to change your treatment, we will call you to review the results.  Testing/Procedures: None ordered  Follow-Up: At [facility], you and your health needs are our priority.  As part of our continuing mission to provide you with exceptional heart care, we have created designated Provider Care Teams.  These Care Teams include your primary Cardiologist (physician) and Advanced Practice Providers (APPs -  Physician Assistants and Nurse Practitioners) who all work together to provide you with the care you need, when you need it. You will need a follow up appointment in [place], PA-C, IN THE [place] OFFICE

## 2018-08-30 ENCOUNTER — Other Ambulatory Visit: Payer: Self-pay | Admitting: Cardiology

## 2018-08-30 LAB — BASIC METABOLIC PANEL
BUN/Creatinine Ratio: 16 (ref 10–24)
BUN: 34 mg/dL — ABNORMAL HIGH (ref 8–27)
CO2: 25 mmol/L (ref 20–29)
Calcium: 9.5 mg/dL (ref 8.6–10.2)
Chloride: 95 mmol/L — ABNORMAL LOW (ref 96–106)
Creatinine, Ser: 2.07 mg/dL — ABNORMAL HIGH (ref 0.76–1.27)
GFR calc Af Amer: 36 mL/min/{1.73_m2} — ABNORMAL LOW (ref >59)
GFR calc non Af Amer: 31 mL/min/{1.73_m2} — ABNORMAL LOW (ref >59)
Glucose: 278 mg/dL — ABNORMAL HIGH (ref 65–99)
Potassium: 4.8 mmol/L (ref 3.5–5.2)
Sodium: 136 mmol/L (ref 134–144)

## 2018-09-01 ENCOUNTER — Telehealth: Payer: Self-pay | Admitting: *Deleted

## 2018-09-01 DIAGNOSIS — Z79899 Other long term (current) drug therapy: Secondary | ICD-10-CM

## 2018-09-01 NOTE — Telephone Encounter (Signed)
-----   Message from Isaiah Serge, NP sent at 08/30/2018  9:41 PM EDT ----- His kidney function is stressed so stop metolazone and extra K+ (not sure if he takes K+ only with metolazone.)- continue torsemide at same dose.  Recheck BMP on Thursday this week the 11th.  He may be able to do in [place]  If you could call their office and ask, I think it is done at [facility] anyway. Thanks.

## 2018-09-01 NOTE — Telephone Encounter (Signed)
This is a [place] pt.  °

## 2018-09-04 ENCOUNTER — Other Ambulatory Visit: Payer: Self-pay

## 2018-09-04 ENCOUNTER — Other Ambulatory Visit: Payer: Medicare HMO | Admitting: *Deleted

## 2018-09-04 DIAGNOSIS — Z79899 Other long term (current) drug therapy: Secondary | ICD-10-CM

## 2018-09-04 LAB — BASIC METABOLIC PANEL
BUN/Creatinine Ratio: 23 (ref 10–24)
BUN: 46 mg/dL — ABNORMAL HIGH (ref 8–27)
CO2: 23 mmol/L (ref 20–29)
Calcium: 9.7 mg/dL (ref 8.6–10.2)
Chloride: 95 mmol/L — ABNORMAL LOW (ref 96–106)
Creatinine, Ser: 2 mg/dL — ABNORMAL HIGH (ref 0.76–1.27)
GFR calc Af Amer: 37 mL/min/{1.73_m2} — ABNORMAL LOW (ref >59)
GFR calc non Af Amer: 32 mL/min/{1.73_m2} — ABNORMAL LOW (ref >59)
Glucose: 183 mg/dL — ABNORMAL HIGH (ref 65–99)
Potassium: 4.5 mmol/L (ref 3.5–5.2)
Sodium: 137 mmol/L (ref 134–144)

## 2018-09-08 ENCOUNTER — Other Ambulatory Visit: Payer: Self-pay | Admitting: Adult Health

## 2018-09-12 ENCOUNTER — Other Ambulatory Visit: Payer: Self-pay | Admitting: "Endocrinology

## 2018-09-12 DIAGNOSIS — E1159 Type 2 diabetes mellitus with other circulatory complications: Secondary | ICD-10-CM | POA: Diagnosis not present

## 2018-09-13 LAB — COMPREHENSIVE METABOLIC PANEL
ALT: 23 IU/L (ref 0–44)
AST: 37 IU/L (ref 0–40)
Albumin/Globulin Ratio: 1.1 — ABNORMAL LOW (ref 1.2–2.2)
Albumin: 4.1 g/dL (ref 3.7–4.7)
Alkaline Phosphatase: 118 IU/L — ABNORMAL HIGH (ref 39–117)
BUN/Creatinine Ratio: 20 (ref 10–24)
BUN: 26 mg/dL (ref 8–27)
Bilirubin Total: 1.6 mg/dL — ABNORMAL HIGH (ref 0.0–1.2)
CO2: 21 mmol/L (ref 20–29)
Calcium: 9.1 mg/dL (ref 8.6–10.2)
Chloride: 100 mmol/L (ref 96–106)
Creatinine, Ser: 1.31 mg/dL — ABNORMAL HIGH (ref 0.76–1.27)
GFR calc Af Amer: 62 mL/min/{1.73_m2} (ref >59)
GFR calc non Af Amer: 54 mL/min/{1.73_m2} — ABNORMAL LOW (ref >59)
Globulin, Total: 3.9 g/dL (ref 1.5–4.5)
Glucose: 86 mg/dL (ref 65–99)
Potassium: 4.3 mmol/L (ref 3.5–5.2)
Sodium: 138 mmol/L (ref 134–144)
Total Protein: 8 g/dL (ref 6.0–8.5)

## 2018-09-13 LAB — SPECIMEN STATUS REPORT

## 2018-09-13 LAB — HGB A1C W/O EAG: Hgb A1c MFr Bld: 7.8 % — ABNORMAL HIGH (ref 4.8–5.6)

## 2018-09-17 ENCOUNTER — Telehealth: Payer: Self-pay | Admitting: Internal Medicine

## 2018-09-17 DIAGNOSIS — I10 Essential (primary) hypertension: Secondary | ICD-10-CM

## 2018-09-17 NOTE — Telephone Encounter (Signed)
Pt made aware. Voiced understanding of plan. He will have labs done Friday. Will fax lab slip to [facility].

## 2018-09-17 NOTE — Telephone Encounter (Signed)
Returned pt call. He states that he weighs himself every morning. He has gained 3 lbs in the last 24 hours. He is having a little swelling in his left leg. No SOB, weakness or chest pain. He just wanted to advise Dr. Bronson Ing that he was taking lasix 40 mg - bid , but due to change in kidney function on most recent labs he was lowered to 40 mg daily. Please advise.

## 2018-09-17 NOTE — Telephone Encounter (Signed)
  Pt c/o swelling: STAT is pt has developed SOB within 24 hours  1) How much weight have you gained and in what time span? 3 lbs in the past day  2) If swelling, where is the swelling located? Left leg, but doesn't have a lot of swelling  3) Are you currently taking a fluid pill? yes  4) Are you currently SOB? no  5) Do you have a log of your daily weights (if so, list)? yes  6) Have you gained 3 pounds in a day or 5 pounds in a week? 3 lbs  7) Have you traveled recently? yes

## 2018-09-17 NOTE — Telephone Encounter (Signed)
Pt followed in [place] office by Dr. Jacinta Shoe

## 2018-09-17 NOTE — Telephone Encounter (Signed)
Would recommend 40 mg torsemide q am and 20 mg q pm with follow up BMET in 2 days.

## 2018-09-18 ENCOUNTER — Other Ambulatory Visit: Payer: Self-pay | Admitting: "Endocrinology

## 2018-09-18 ENCOUNTER — Other Ambulatory Visit: Payer: Self-pay | Admitting: Cardiovascular Disease

## 2018-09-19 ENCOUNTER — Ambulatory Visit (INDEPENDENT_AMBULATORY_CARE_PROVIDER_SITE_OTHER): Payer: Medicare HMO | Admitting: "Endocrinology

## 2018-09-19 ENCOUNTER — Other Ambulatory Visit: Payer: Self-pay

## 2018-09-19 ENCOUNTER — Other Ambulatory Visit (HOSPITAL_COMMUNITY)
Admission: RE | Admit: 2018-09-19 | Discharge: 2018-09-19 | Disposition: A | Discharge status: Home / Self Care | Payer: Medicare HMO | Source: Ambulatory Visit | Attending: Cardiovascular Disease | Admitting: Cardiovascular Disease

## 2018-09-19 ENCOUNTER — Encounter: Payer: Self-pay | Admitting: "Endocrinology

## 2018-09-19 DIAGNOSIS — E782 Mixed hyperlipidemia: Secondary | ICD-10-CM

## 2018-09-19 DIAGNOSIS — I10 Essential (primary) hypertension: Secondary | ICD-10-CM | POA: Insufficient documentation

## 2018-09-19 DIAGNOSIS — E1159 Type 2 diabetes mellitus with other circulatory complications: Secondary | ICD-10-CM

## 2018-09-19 LAB — BASIC METABOLIC PANEL
Anion gap: 11 (ref 5–15)
BUN: 25 mg/dL — ABNORMAL HIGH (ref 8–23)
CO2: 26 mmol/L (ref 22–32)
Calcium: 9.3 mg/dL (ref 8.9–10.3)
Chloride: 102 mmol/L (ref 98–111)
Creatinine, Ser: 1.3 mg/dL — ABNORMAL HIGH (ref 0.61–1.24)
GFR calc Af Amer: >60 mL/min (ref >60)
GFR calc non Af Amer: 55 mL/min — ABNORMAL LOW (ref >60)
Glucose, Bld: 221 mg/dL — ABNORMAL HIGH (ref 70–99)
Potassium: 4.4 mmol/L (ref 3.5–5.1)
Sodium: 139 mmol/L (ref 135–145)

## 2018-09-19 MED ORDER — METFORMIN HCL 500 MG PO TABS: 500.0000 mg | ORAL_TABLET | Freq: Two times a day (BID) | ORAL | 1 refills | Status: DC

## 2018-09-19 NOTE — Progress Notes (Signed)
09/19/2018, 1:24 PM                                                       Endocrinology Telehealth Visit Follow up Note -During COVID -19 Pandemic  This visit type was conducted due to national recommendations for restrictions regarding the COVID-19 Pandemic  in an effort to limit this patient's exposure and mitigate transmission of the corona virus.  Due to his co-morbid illnesses, John [name] is at  moderate to high risk for complications without adequate follow up.  This format is felt to be most appropriate for him at this time.  I connected with this patient on 09/19/2018   by telephone and verified that I am speaking with the correct person using two identifiers. John [name], December 24, 1945. he has verbally consented to this visit. All issues noted in this document were discussed and addressed. The format was not optimal for physical exam.    Subjective:    Patient ID: John [name], male    DOB: May 15, 1945.  John [name] is engaged in a telehealth via telephone for the management of his currently uncontrolled type 2 diabetes, hyperlipidemia.    PMD:  Mikey Kirschner, MD.   Past Medical History:  Diagnosis Date  . Atrial flutter ([place]) 12/2010   Admitted with symptomatic bradycardia (HR 40s), atrial flutter with slow ventricular response 12/2010 + volume overload; AV nodal agents d/c'd and Pradaxa started; RFA in 01/2011  . CHF (congestive heart failure) ([place])   . Chronic combined systolic and diastolic heart failure (HCC)    echo 01/06/11: mild LVH, EF 65-70%, mod to severe LAE, mild RVE, mild RAE, PASP 32;   TEE 10/12: EF 45-50%   . Class 2 severe obesity due to excess calories with serious comorbidity and body mass index (BMI) of 37.0 to 37.9 in adult ([place]) 07/17/2017  . Diabetes mellitus    non insulin dependant  . Hyperlipidemia   . Hypertension   . Osteoarthritis   . Presence  of permanent cardiac pacemaker   . PSVT (paroxysmal supraventricular tachycardia) (HCC)    Possibly atrial flutter   Past Surgical History:  Procedure Laterality Date  . ATRIAL FLUTTER ABLATION N/A 02/07/2011   Procedure: ATRIAL FLUTTER ABLATION;  Surgeon: Evans Lance, MD;  Location: [facility] CATH LAB;  Service: Cardiovascular;  Laterality: N/A;  . CARDIAC ELECTROPHYSIOLOGY STUDY AND ABLATION  02/07/11  . CARDIOVERSION N/A 03/23/2016   Procedure: CARDIOVERSION;  Surgeon: Evans Lance, MD;  Location: [facility];  Service: Cardiovascular;  Laterality: N/A;  . COLONOSCOPY N/A 04/17/2017   Procedure: COLONOSCOPY;  Surgeon: Rogene Houston, MD;  [facility];  Service: Endoscopy;  Laterality: N/A;  830  . EP IMPLANTABLE DEVICE N/A 08/29/2015   Procedure: Pacemaker Implant;  Surgeon: Evans Lance, MD;  Location: [place] CV LAB;  Service: Cardiovascular;  Laterality: N/A;  . EP IMPLANTABLE DEVICE N/A  12/07/2015   Procedure: PPM Lead Revision/Repair;  Surgeon: Evans Lance, MD;  Location: [place] CV LAB;  Service: Cardiovascular;  Laterality: N/A;  . KNEE ARTHROSCOPY  2005   left  . LUMBAR SPINE SURGERY     "I've had 6 ORs 1972 thru 2004"  . POLYPECTOMY  04/17/2017   Procedure: POLYPECTOMY;  Surgeon: Rogene Houston, MD;  Location: [facility];  Service: Endoscopy;;  transverse colon x3;   Social History   Socioeconomic History  . Marital status: Married    Spouse name: Not on file  . Number of children: Not on file  . Years of education: Not on file  . Highest education level: Not on file  Occupational History  . Occupation: Immunologist: RETIRED  Social Needs  . Financial resource strain: Not on file  . Food insecurity    Worry: Not on file    Inability: Not on file  . Transportation needs    Medical: Not on file    Non-medical: Not on file  Tobacco Use  . Smoking status: Former Smoker    Packs/day: 1.00    Years: 10.00    Pack  years: 10.00    Types: Cigarettes    Quit date: 03/26/1986    Years since quitting: 32.5  . Smokeless tobacco: Never Used  . Tobacco comment: "stopped cigarette  smoking 1988"  Substance and Sexual Activity  . Alcohol use: Yes    Alcohol/week: 0.0 standard drinks    Comment: "quit alcohol ~ 2007"  . Drug use: No  . Sexual activity: Yes    Partners: Female  Lifestyle  . Physical activity    Days per week: Not on file    Minutes per session: Not on file  . Stress: Not on file  Relationships  . Social Herbalist on phone: Not on file    Gets together: Not on file    Attends religious service: Not on file    Active member of club or organization: Not on file    Attends meetings of clubs or organizations: Not on file    Relationship status: Not on file  Other Topics Concern  . Not on file  Social History Narrative  . Not on file   Outpatient Encounter Medications as of 09/19/2018  Medication Sig  . amLODipine (NORVASC) 10 MG tablet Take 1 tablet (10 mg total) by mouth daily. One daily  . benzonatate (TESSALON) 100 MG capsule TAKE 1 CAPSULE (100 MG TOTAL) BY MOUTH EVERY 6 (SIX) HOURS AS NEEDED FOR COUGH.  . carvedilol (COREG) 25 MG tablet TAKE 1 TABLET (25 MG TOTAL) BY MOUTH 2 (TWO) TIMES DAILY WITH A MEAL.  Marland Kitchen ELIQUIS 5 MG TABS tablet TAKE 1 TABLET TWICE DAILY  . glucose blood test strip 1 each by Other route as needed. Use as instructed bid  . Insulin Degludec (TRESIBA FLEXTOUCH) 200 UNIT/ML SOPN Inject 60 Units into the skin at bedtime.  . Insulin Pen Needle 31G X 8 MM MISC USE EVERY DAY AS DIRECTED BY PHYSICIAN  . Magnesium 400 MG CAPS Take 400 mg by mouth daily.   . metFORMIN (GLUCOPHAGE) 500 MG tablet Take 1 tablet (500 mg total) by mouth 2 (two) times daily with a meal.  . metolazone (ZAROXOLYN) 2.5 MG tablet Take 1 tablet (2.5 mg total) by mouth every other day.  . Multiple Vitamins-Minerals (MULTIVITAMIN WITH MINERALS) tablet Take 1 tablet by mouth daily.  Marland Kitchen  Potassium Chloride ER 20 MEQ TBCR TAKE HALF A TABLET BY MOUTH DAILY AS NEEDED. TAKE WHEN TAKING METOLAZONE.  Marland Kitchen Resveratrol 100 MG CAPS Take 100 mg by mouth at bedtime.   . rosuvastatin (CRESTOR) 20 MG tablet Take 1 tablet (20 mg total) by mouth daily.  Marland Kitchen torsemide (DEMADEX) 20 MG tablet Take 2 tablets (40 mg total) by mouth 2 (two) times daily.  . TRULICITY 1.5 HK/7.4QV SOPN INJECT  1.5MG (CONTENTS OF 1 PEN)  INTO  THE  SKIN ONE TIME WEEKLY  . [DISCONTINUED] metFORMIN (GLUCOPHAGE) 1000 MG tablet Take 1 tablet (1,000 mg total) by mouth 2 (two) times daily with a meal.  . [DISCONTINUED] metFORMIN (GLUCOPHAGE) 500 MG tablet Take 500 mg by mouth 2 (two) times daily with a meal.   No facility-administered encounter medications on file as of 09/19/2018.     ALLERGIES: No Known Allergies  VACCINATION STATUS: Immunization History  Administered Date(s) Administered  . Influenza,inj,Quad PF,6+ Mos 11/24/2014, 11/29/2015, 12/11/2016, 12/25/2017  . Influenza-Unspecified 11/24/2013  . Pneumococcal Conjugate-13 11/19/2013  . Pneumococcal Polysaccharide-23 11/29/2015  . Pneumococcal-Unspecified 03/24/2009  . Td 06/23/2010    Diabetes He presents for his follow-up (Telephone visit due to coronavirus pandemic) diabetic visit. He has type 2 diabetes mellitus. Onset time: He was diagnosed at approximate age of 67 years. His disease course has been fluctuating. There are no hypoglycemic associated symptoms. Pertinent negatives for hypoglycemia include no confusion, pallor or seizures. There are no diabetic associated symptoms. Pertinent negatives for diabetes include no fatigue, no polydipsia, no polyphagia, no polyuria and no weakness. There are no hypoglycemic complications. Symptoms are improving. Diabetic complications include heart disease and nephropathy. Risk factors for coronary artery disease include diabetes mellitus, dyslipidemia, family history, obesity, male sex, hypertension, sedentary lifestyle  and tobacco exposure. Current diabetic treatment includes insulin injections. He is following a generally unhealthy diet. When asked about meal planning, he reported none. He has not had a previous visit with a dietitian. He never participates in exercise. His home blood glucose trend is decreasing steadily. His breakfast blood glucose range is generally 140-180 mg/dl. His bedtime blood glucose range is generally 140-180 mg/dl. His overall blood glucose range is 140-180 mg/dl. An ACE inhibitor/angiotensin II receptor blocker is being taken. He does not see a podiatrist.Eye exam is current.  Hyperlipidemia This is a chronic problem. The current episode started more than 1 year ago. The problem is controlled. Exacerbating diseases include diabetes and obesity. Pertinent negatives include no myalgias. Current antihyperlipidemic treatment includes statins. Risk factors for coronary artery disease include diabetes mellitus, dyslipidemia, family history, obesity, male sex, hypertension and a sedentary lifestyle.    Objective:      CMP     Component Value Date/Time   NA 139 09/19/2018 1102   NA 138 09/12/2018 0857   K 4.4 09/19/2018 1102   CL 102 09/19/2018 1102   CO2 26 09/19/2018 1102   GLUCOSE 221 (H) 09/19/2018 1102   BUN 25 (H) 09/19/2018 1102   BUN 26 09/12/2018 0857   CREATININE 1.30 (H) 09/19/2018 1102   CREATININE 1.32 (H) 03/07/2016 1043   CALCIUM 9.3 09/19/2018 1102   PROT 8.0 09/12/2018 0857   ALBUMIN 4.1 09/12/2018 0857   AST 37 09/12/2018 0857   ALT 23 09/12/2018 0857   ALKPHOS 118 (H) 09/12/2018 0857   BILITOT 1.6 (H) 09/12/2018 0857   GFRNONAA 55 (L) 09/19/2018 1102   GFRAA >60 09/19/2018 1102     Diabetic Labs (most recent): Lab Results  Component Value Date   HGBA1C 7.8 (H) 09/12/2018   HGBA1C 6.8 (H) 08/19/2018   HGBA1C 7.5 (H) 06/11/2018     Lipid Panel     Component Value Date/Time   CHOL 142 01/17/2018 0835   TRIG 90 01/17/2018 0835   HDL 53 01/17/2018  0835   CHOLHDL 2.7 01/17/2018 0835   CHOLHDL 3.2 11/16/2013 0702   VLDL 45 (H) 11/16/2013 0702   LDLCALC 71 01/17/2018 0835      Lab Results  Component Value Date   TSH 1.940 07/10/2017   TSH 1.563 08/25/2015   TSH 1.206 01/05/2011   FREET4 1.12 07/10/2017        Assessment & Plan:   1. DM type 2 causing vascular disease ([place])  - John [name] has currently uncontrolled symptomatic type 2 DM since 73 years of age. -His previsit labs show A1c of 7.8% increasing from 7.5%.  Recently, he was hospitalized with CHF and acute on chronic renal insufficiency which is progressively improving.     -He does not report any major hypoglycemia. Recent labs reviewed.  -his diabetes is complicated by obesity/sedentary life , CKD, CHF , congestive heart failure/bradyarrhythmia and ASEEM SESSUMS remains at a high risk for more acute and chronic complications which include CAD, CVA, CKD, retinopathy, and neuropathy. These are all discussed in detail with the patient.  - I encouraged him to switch to  unprocessed or minimally processed complex starch and increased protein intake (animal or plant source), fruits, and vegetables.  - he is advised to stick to a routine mealtimes to eat 3 meals  a day and avoid unnecessary snacks ( to snack only to correct hypoglycemia).    - I have approached him with the following individualized plan to manage diabetes and patient agrees:   -Based on his current glycemic response, he will not require prandial insulin for now.   -Due to relatively tight fasting blood glucose profile, he is advised to continue the same a 60 units nightly , associated with strict monitoring of blood glucose 2 times a day-daily before breakfast and at bedtime. - Patient is warned not to take insulin without proper monitoring per orders.  -Patient is encouraged to call clinic for blood glucose levels less than 70 or above 200 mg /dl. -He is advised to continue metformin  500 mg  p.o. twice daily--after breakfast and after supper.  He will not tolerate higher dose of metformin due to CHF and recent acute on chronic renal insufficiency.  -He is advised to continue Trulicity 1.5 mg subcutaneously weekly  , side effects and precautions discussed with him.     2) Lipids/HPL:    His recent lipid panel shows controlled LDL at 53.  He is advised to advised to continue Crestor 20 mg p.o. nightly.    3) hypertension- he is advised to home monitor blood pressure and report if > 140/90 on 2 separate readings. -He is advised to continue on his torsemide as needed, carvedilol 25 mg p.o. twice daily.  He is advised to continue follow-up with his cardiologist.  - I advised patient to maintain close follow up with Mikey Kirschner, MD for primary care needs.  - Patient Care Time Today:  25 min, of which >50% was spent in reviewing his  current and  previous labs/studies, previous treatments, and medications doses and developing a plan for long-term care based on the latest recommendations for standards of care.  John [name] participated in the discussions,  expressed understanding, and voiced agreement with the above plans.  All questions were answered to his satisfaction. he is encouraged to contact clinic should he have any questions or concerns prior to his return visit.    Follow up plan: - Return in about 4 months (around 01/19/2019) for Follow up with Pre-visit Labs, Meter, and Logs.  Glade Lloyd, MD [facility] [address] [place], [place] [address] Phone: [phone]  Fax: [phone]    09/19/2018, 1:24 PM  This note was partially dictated with voice recognition software. Similar sounding words can be transcribed inadequately or may not  be corrected upon review.

## 2018-09-24 ENCOUNTER — Other Ambulatory Visit: Payer: Self-pay

## 2018-09-24 ENCOUNTER — Encounter: Payer: Self-pay | Admitting: Student

## 2018-09-24 ENCOUNTER — Ambulatory Visit (INDEPENDENT_AMBULATORY_CARE_PROVIDER_SITE_OTHER): Payer: Medicare HMO | Admitting: Student

## 2018-09-24 VITALS — BP 124/82 | Ht 71.0 in | Wt 260.0 lb

## 2018-09-24 DIAGNOSIS — Z950 Presence of cardiac pacemaker: Secondary | ICD-10-CM

## 2018-09-24 DIAGNOSIS — I10 Essential (primary) hypertension: Secondary | ICD-10-CM | POA: Diagnosis not present

## 2018-09-24 DIAGNOSIS — E785 Hyperlipidemia, unspecified: Secondary | ICD-10-CM

## 2018-09-24 DIAGNOSIS — I350 Nonrheumatic aortic (valve) stenosis: Secondary | ICD-10-CM | POA: Diagnosis not present

## 2018-09-24 DIAGNOSIS — I4821 Permanent atrial fibrillation: Secondary | ICD-10-CM | POA: Diagnosis not present

## 2018-09-24 DIAGNOSIS — I5042 Chronic combined systolic (congestive) and diastolic (congestive) heart failure: Secondary | ICD-10-CM | POA: Diagnosis not present

## 2018-09-24 MED ORDER — TORSEMIDE 20 MG PO TABS: ORAL_TABLET | ORAL | 3 refills | Status: DC

## 2018-09-24 NOTE — Progress Notes (Signed)
Cardiology Office Note    Date:  09/24/2018   ID:  John [name], DOB 07-05-1945, MRN [id number]  PCP:  Mikey Kirschner, MD  Cardiologist: [name], MD    Chief Complaint  Patient presents with   Follow-up    3 week visit    History of Present Illness:    John [name] is a 73 y.o. male with past medical history of CHB (s/p PPM placement in 2017), paroxysmal atrial flutter (s/p ablation in 2012), paroxysmal atrial fibrillation, chronic combined systolic and diastolic CHF, HTN, HLD, and mild AS who presents for a 3-week follow-up visit.   He was admitted to [facility] from 5/26 - 08/22/2018 for an acute CHF exacerbation. BNP was elevated to 1285 on admission and he diuresed well with IV Lasix and was transitioned to Torsemide 21m BID at the time of discharge along with Metolazone 2.551mevery other day with weight at 262 lbs.   Was evaluated by LaCecilie KicksNP on 08/29/2018 for hospital follow-up and reported overall doing well denying any recent symptoms. Weight had continued to decline, at 256 lbs on the office scales. Repeat labs were obtained and showed his creatinine had trended upwards from 1.32 at the time of hospital discharge to 2.07, therefore Metolazone was discontinued. Repeat labs on 6/11 showed creatinine was still elevated at 2.00 and it was recommended to reduce Torsemide to 4011maily.     He called the office on 6/24 reporting a weight gain of 3 lbs in 24 hours and it was recommended he take Torsemide 81m64m AM/20mg52mPM. Repeat labs on 6/26 showed stable renal function with creatinine of 1.30 and K+ at 4.4.  In talking with the patient today, he reports his weight had typically been around 248 - 249 lbs on his home scales but this has been elevated at 252 lbs for the past few days despite compliance with his current Torsemide dosing. He has also noticed worsening edema during this time frame. Denies any associated dyspnea on exertion, orthopnea, or PND.  No recent chest pain or palpitations. Has been walking for over 1 mile per day without any anginal symptoms.   He is following a low-sodium diet. The patient and his wife cook 95% of their meals and consume lots of vegetables from their garden.     Past Medical History:  Diagnosis Date   Atrial flutter (HCC) [place]   Admitted with symptomatic bradycardia (HR 40s), atrial flutter with slow ventricular response 12/2010 + volume overload; AV nodal agents d/c'd and Pradaxa started; RFA in 01/2011   CHF (congestive heart failure) (HCC)    Chronic combined systolic and diastolic heart failure (HCC) [place] 01[place]/11: mild LVH, EF 65-70%, mod to severe LAE, mild RVE, mild RAE, PASP 32;   TEE 10/12: EF 45-50%    Class 2 severe obesity due to excess calories with serious comorbidity and body mass index (BMI) of 37.0 to 37.9 in adult (HCC)[facility]   Diabetes mellitus    non insulin dependant   Hyperlipidemia    Hypertension    Osteoarthritis    Presence of permanent cardiac pacemaker    PSVT ([place]oxysmal supraventricular tachycardia) ([place]    Past Surgical History:  Procedure Laterality Date   ATRIAL FLUTTER ABLATION N/A 02/07/2011   Procedure: ATRIAL FLUTTER ABLATION;  Surgeon: GreggEvans Lance  [facility];  Service: Cardiovascular;  Laterality: N/A;   CARDIAC ELECTROPHYSIOLOGY  STUDY AND ABLATION  02/07/11   CARDIOVERSION N/A 03/23/2016   Procedure: CARDIOVERSION;  Surgeon: Evans Lance, MD;  Location: [place];  Service: Cardiovascular;  Laterality: N/A;   COLONOSCOPY N/A 04/17/2017   Procedure: COLONOSCOPY;  Surgeon: Rogene Houston, MD;  [facility];  Service: Endoscopy;  Laterality: N/A;  [place] N/A 08/29/2015   Procedure: Pacemaker Implant;  Surgeon: Evans Lance, MD;  Location: [place] CV LAB;  Service: Cardiovascular;  Laterality: N/A;   EP IMPLANTABLE DEVICE N/A 12/07/2015    Procedure: PPM Lead Revision/Repair;  Surgeon: Evans Lance, MD;  Location: [place] CV LAB;  Service: Cardiovascular;  Laterality: N/A;   KNEE ARTHROSCOPY  2005   left   LUMBAR SPINE SURGERY     "I've had 6 ORs 1972 thru 2004"   POLYPECTOMY  04/17/2017   Procedure: POLYPECTOMY;  Surgeon: Rogene Houston, MD;  [facility];  Service: Endoscopy;;  transverse colon x3;    Current Medications: Outpatient Medications Prior to Visit  Medication Sig Dispense Refill   amLODipine (NORVASC) 10 MG tablet Take 1 tablet (10 mg total) by mouth daily. One daily 90 tablet 1   benzonatate (TESSALON) 100 MG capsule TAKE 1 CAPSULE (100 MG TOTAL) BY MOUTH EVERY 6 (SIX) HOURS AS NEEDED FOR COUGH. 30 capsule 2   carvedilol (COREG) 25 MG tablet TAKE 1 TABLET (25 MG TOTAL) BY MOUTH 2 (TWO) TIMES DAILY WITH A MEAL. 180 tablet 3   ELIQUIS 5 MG TABS tablet TAKE 1 TABLET TWICE DAILY 180 tablet 3   glucose blood test strip 1 each by Other route as needed. Use as instructed bid 100 each 3   Insulin Degludec (TRESIBA FLEXTOUCH) 200 UNIT/ML SOPN Inject 60 Units into the skin at bedtime. 4 pen 2   Insulin Pen Needle 31G X 8 MM MISC USE EVERY DAY AS DIRECTED BY PHYSICIAN 100 each 4   Magnesium 400 MG CAPS Take 400 mg by mouth daily.      metFORMIN (GLUCOPHAGE) 500 MG tablet Take 1 tablet (500 mg total) by mouth 2 (two) times daily with a meal. 180 tablet 1   Multiple Vitamins-Minerals (MULTIVITAMIN WITH MINERALS) tablet Take 1 tablet by mouth daily.     Resveratrol 100 MG CAPS Take 100 mg by mouth at bedtime.      rosuvastatin (CRESTOR) 20 MG tablet Take 1 tablet (20 mg total) by mouth daily. 90 tablet 1   torsemide (DEMADEX) 20 MG tablet Take 2 tablets (40 mg total) by mouth 2 (two) times daily. 759 tablet 1   TRULICITY 1.5 FM/3.8GY SOPN INJECT  1.5MG (CONTENTS OF 1 PEN)  INTO  THE  SKIN ONE TIME WEEKLY 6 mL 0   metolazone (ZAROXOLYN) 2.5 MG tablet Take 1 tablet (2.5 mg total) by mouth  every other day. 30 tablet 0   Potassium Chloride ER 20 MEQ TBCR TAKE HALF A TABLET BY MOUTH DAILY AS NEEDED. TAKE WHEN TAKING METOLAZONE. 45 tablet 1   No facility-administered medications prior to visit.      Allergies:   Patient has no known allergies.   Social History   Socioeconomic History   Marital status: Married    Spouse name: Not on file   Number of children: Not on file   Years of education: Not on file   Highest education level: Not on file  Occupational History   Occupation: Designer, industrial/product    Employer: [employer]  Financial resource strain: Not on file   Food insecurity    Worry: Not on file    Inability: Not on file   Transportation needs    Medical: Not on file    Non-medical: Not on file  Tobacco Use   Smoking status: Former Smoker    Packs/day: 1.00    Years: 10.00    Pack years: 10.00    Types: Cigarettes    Quit date: 03/26/1986    Years since quitting: 32.5   Smokeless tobacco: Never Used   Tobacco comment: "stopped cigarette  smoking 1988"  Substance and Sexual Activity   Alcohol use: Never    Alcohol/week: 0.0 standard drinks    Frequency: Never    Comment: "quit alcohol ~ 2007"   Drug use: No   Sexual activity: Yes    Partners: Female  Lifestyle   Physical activity    Days per week: Not on file    Minutes per session: Not on file   Stress: Not on file  Relationships   Social connections    Talks on phone: Not on file    Gets together: Not on file    Attends religious service: Not on file    Active member of club or organization: Not on file    Attends meetings of clubs or organizations: Not on file    Relationship status: Not on file  Other Topics Concern   Not on file  Social History Narrative   Not on file     Family History:  The patient's family history includes Heart attack in his brother, father, and mother; Hypertension in his father and mother.   Review of Systems:   Please see the  history of present illness.     General:  No chills, fever, night sweats or weight changes.  Cardiovascular:  No chest pain, dyspnea on exertion, orthopnea, palpitations, paroxysmal nocturnal dyspnea. Positive for lower extremity edema.  Dermatological: No rash, lesions/masses Respiratory: No cough, dyspnea Urologic: No hematuria, dysuria Abdominal:   No nausea, vomiting, diarrhea, bright red blood per rectum, melena, or hematemesis Neurologic:  No visual changes, wkns, changes in mental status. All other systems reviewed and are otherwise negative except as noted above.   Physical Exam:    VS:  BP 124/82    Ht 5' 11"  (1.803 m)    Wt 260 lb (117.9 kg)    BMI 36.26 kg/m    General: Well developed, well nourished,male appearing in no acute distress. Head: Normocephalic, atraumatic, sclera non-icteric, no xanthomas, nares are without discharge.  Neck: No carotid bruits. JVD not elevated.  Lungs: Respirations regular and unlabored, without wheezes or rales.  Heart: Irregularly irregular. No S3 or S4.  No rubs or gallops appreciated. 2/6 SEM along RUSB.  Abdomen: Soft, non-tender, non-distended with normoactive bowel sounds. No hepatomegaly. No rebound/guarding. No obvious abdominal masses. Msk:  Strength and tone appear normal for age. No joint deformities or effusions. Extremities: No clubbing or cyanosis.1+ pitting edema up to mid-shins bilaterally.  Distal pedal pulses are 2+ bilaterally. Neuro: Alert and oriented X 3. Moves all extremities spontaneously. No focal deficits noted. Psych:  Responds to questions appropriately with a normal affect. Skin: No rashes or lesions noted  Wt Readings from Last 3 Encounters:  09/24/18 260 lb (117.9 kg)  08/29/18 256 lb 12.8 oz (116.5 kg)  08/22/18 262 lb 8 oz (119.1 kg)     Studies/Labs Reviewed:   EKG:  EKG is not ordered today.   Recent  Labs: 08/19/2018: B Natriuretic Peptide 1,285.6 08/20/2018: Hemoglobin 12.4; Platelets 85 09/12/2018:  ALT 23 09/19/2018: BUN 25; Creatinine, Ser 1.30; Potassium 4.4; Sodium 139   Lipid Panel    Component Value Date/Time   CHOL 142 01/17/2018 0835   TRIG 90 01/17/2018 0835   HDL 53 01/17/2018 0835   CHOLHDL 2.7 01/17/2018 0835   CHOLHDL 3.2 11/16/2013 0702   VLDL 45 (H) 11/16/2013 0702   LDLCALC 71 01/17/2018 0835    Additional studies/ records that were reviewed today include:   Echocardiogram: 08/20/2018 IMPRESSIONS   1. The left ventricle has mildly reduced systolic function, with an ejection fraction of 45-50%. The cavity size was mild to moderately dilated. There is mildly increased left ventricular wall thickness. Left ventricular diastolic Doppler parameters are  consistent with pseudonormalization. Left ventrical global hypokinesis without regional wall motion abnormalities.  2. The right ventricle has normal systolic function. The cavity was normal. There is no increase in right ventricular wall thickness.  3. Left atrial size was severely dilated.  4. The aortic valve is tricuspid. Mild thickening of the aortic valve. Moderate calcification of the aortic valve. Mild stenosis of the aortic valve.  5. The ascending aorta is normal in size and structure.  6. The inferior vena cava was dilated in size with <50% respiratory variability.  Assessment:    1. Chronic combined systolic and diastolic heart failure (HCC)   2. Cardiac pacemaker in situ   3. Permanent atrial fibrillation   4. Essential hypertension   5. Hyperlipidemia LDL goal <70   6. Aortic valve stenosis, etiology of cardiac valve disease unspecified      Plan:   In order of problems listed above:  1. Chronic Combined Systolic and Diastolic CHF - he has a known mildly reduced EF of 45-50% by most recent echocardiogram. He denies any dyspnea on exertion, orthopnea, or PND but has experienced worsening edema in the setting of a 4 lb weight gain within the past week. He is currently on Torsemide 34m in AM/258m in PM. I recommended he take Torsemide 4034mID for the next 3 days then resume at 76m43m AM/20mg97mPM as creatinine trended up significantly when previously on the higher dose for an extended period of time. Creatinine had stabilized at 1.30 when checked on 09/19/2018. - continued compliance with sodium and fluid restriction reviewed. Will continue Coreg. No longer on Benazepril as this was discontinued during recent admission due to variable renal function.   2. CHB - s/p PPM placement in 2017 which is followed by Dr. TayloLovena Let recent device interrogation showed normal device function.   3. Permanent Atrial Fibrillation - he denies any recent palpitations. HR well-controlled during today's visit.  - continue Coreg for rate-control and Eliquis 5mg B34mfor anticoagulation.   4. HTN -BP well-controlled at 124/82 during today's visit. Continue current regimen with Amlodipine 10mg d91m and Coreg 25mg BI30m 5. HLD - followed by PCP. LDL at 71 when checked in 2019. Remains on Crestor 20mg dai36m  6. Aortic Stenosis - mild by echocardiogram in 07/2018. Continue to follow.   Medication Adjustments/Labs and Tests Ordered: Current medicines are reviewed at length with the patient today.  Concerns regarding medicines are outlined above.  Medication changes, Labs and Tests ordered today are listed in the Patient Instructions below. Patient Instructions  Medication Instructions:  Your physician recommends that you continue on your current medications as directed. Please refer to the Current Medication list given to  you today.   Take Torsemide 40 mg 2 Times Daily for 3 Days the Resume Torsemide 40 mg in the AM and 20 mg in the PM   If you need a refill on your cardiac medications before your next appointment, please call your pharmacy.   Lab work: NONE  If you have labs (blood work) drawn today and your tests are completely normal, you will receive your results only by:  [place]  (if you have MyChart) OR  A paper copy in the mail If you have any lab test that is abnormal or we need to change your treatment, we will call you to review the results.  Testing/Procedures: NONE   Follow-Up: At [facility], you and your health needs are our priority.  As part of our continuing mission to provide you with exceptional heart care, we have created designated Provider Care Teams.  These Care Teams include your primary Cardiologist (physician) and Advanced Practice Providers (APPs -  Physician Assistants and Nurse Practitioners) who all work together to provide you with the care you need, when you need it. You will need a follow up appointment July.  Please call our office 2 months in advance to schedule this appointment.  You may see [name], MD or one of the following Advanced Practice Providers on your designated Care Team:   Bernerd Pho, PA-C ([place])  Ermalinda Barrios, PA-C [facility])  Any Other Special Instructions Will Be Listed Below (If Applicable). Thank you for choosing [facility]!   Limit daily fluid intake to less than 2 Liters per day.  Please limit salt intake.   Please weight yourself every morning. Call cardiology [phone]) if weight increases by 3 pounds overnight or 5 pounds in a single week.   Signed, Erma Heritage, PA-C  09/24/2018 7:32 PM    [place] [address] [place], [place] [address] Phone: [phone] Fax: [phone]

## 2018-09-24 NOTE — Patient Instructions (Addendum)
Medication Instructions:  Your physician recommends that you continue on your current medications as directed. Please refer to the Current Medication list given to you today.   Take Torsemide 40 mg 2 Times Daily for 3 Days the Resume Torsemide 40 mg in the AM and 20 mg in the PM   If you need a refill on your cardiac medications before your next appointment, please call your pharmacy.   Lab work: NONE  If you have labs (blood work) drawn today and your tests are completely normal, you will receive your results only by: [facility] MyChart Message (if you have MyChart) OR . A paper copy in the mail If you have any lab test that is abnormal or we need to change your treatment, we will call you to review the results.  Testing/Procedures: NONE   Follow-Up: At [facility], you and your health needs are our priority.  As part of our continuing mission to provide you with exceptional heart care, we have created designated Provider Care Teams.  These Care Teams include your primary Cardiologist (physician) and Advanced Practice Providers (APPs -  Physician Assistants and Nurse Practitioners) who all work together to provide you with the care you need, when you need it. You will need a follow up appointment July.  Please call our office 2 months in advance to schedule this appointment.  You may see [name], MD or one of the following Advanced Practice Providers on your designated Care Team:   Bernerd Pho, PA-C [facility]) . Ermalinda Barrios, PA-C ([place])  Any Other Special Instructions Will Be Listed Below (If Applicable). Thank you for choosing [facility]!     Limit daily fluid intake to less than 2 Liters per day.  Please limit salt intake.   Please weight yourself every morning. Call cardiology [phone]) if weight increases by 3 pounds overnight or 5 pounds in a single week.

## 2018-09-30 ENCOUNTER — Telehealth: Payer: Self-pay | Admitting: Cardiovascular Disease

## 2018-09-30 DIAGNOSIS — Z79899 Other long term (current) drug therapy: Secondary | ICD-10-CM

## 2018-09-30 MED ORDER — METOLAZONE 2.5 MG PO TABS: ORAL_TABLET | ORAL | 0 refills | Status: DC

## 2018-09-30 NOTE — Addendum Note (Signed)
Addended by: Barbarann Ehlers A on: 09/30/2018 11:05 AM   Modules accepted: Orders

## 2018-09-30 NOTE — Telephone Encounter (Signed)
Pt had 5lb weight gain over night. States no Chest pain or SOB

## 2018-09-30 NOTE — Telephone Encounter (Signed)
Have him take metolazone 2.5 mg today and monitor response (symptoms, weight). If weight not back to baseline by tomorrow, can repeat 2.5 mg. Check BMET in 2 days.

## 2018-09-30 NOTE — Telephone Encounter (Signed)
He did take the extra demedax as instructed and said the only swelling he has now is in his lower legs.His weight went up 5 lbs overnight

## 2018-09-30 NOTE — Telephone Encounter (Signed)
Did he increase torsemide to BID for a few days as recommended by [place] on 7/1?

## 2018-09-30 NOTE — Telephone Encounter (Signed)
E-scribed metolazone, faxed lab slip to lab, pt verbalized understanding of instructions

## 2018-10-02 ENCOUNTER — Telehealth: Payer: Self-pay

## 2018-10-02 ENCOUNTER — Other Ambulatory Visit (HOSPITAL_COMMUNITY)
Admission: RE | Admit: 2018-10-02 | Discharge: 2018-10-02 | Disposition: A | Discharge status: Home / Self Care | Payer: Medicare HMO | Source: Ambulatory Visit | Attending: Cardiovascular Disease | Admitting: Cardiovascular Disease

## 2018-10-02 ENCOUNTER — Other Ambulatory Visit: Payer: Self-pay

## 2018-10-02 DIAGNOSIS — Z79899 Other long term (current) drug therapy: Secondary | ICD-10-CM | POA: Diagnosis not present

## 2018-10-02 DIAGNOSIS — E876 Hypokalemia: Secondary | ICD-10-CM

## 2018-10-02 LAB — BASIC METABOLIC PANEL
Anion gap: 14 (ref 5–15)
BUN: 25 mg/dL — ABNORMAL HIGH (ref 8–23)
CO2: 27 mmol/L (ref 22–32)
Calcium: 9.4 mg/dL (ref 8.9–10.3)
Chloride: 95 mmol/L — ABNORMAL LOW (ref 98–111)
Creatinine, Ser: 1.46 mg/dL — ABNORMAL HIGH (ref 0.61–1.24)
GFR calc Af Amer: 55 mL/min — ABNORMAL LOW (ref >60)
GFR calc non Af Amer: 47 mL/min — ABNORMAL LOW (ref >60)
Glucose, Bld: 231 mg/dL — ABNORMAL HIGH (ref 70–99)
Potassium: 3.1 mmol/L — ABNORMAL LOW (ref 3.5–5.1)
Sodium: 136 mmol/L (ref 135–145)

## 2018-10-02 MED ORDER — POTASSIUM CHLORIDE CRYS ER 20 MEQ PO TBCR: EXTENDED_RELEASE_TABLET | ORAL | 3 refills | Status: DC

## 2018-10-02 NOTE — Telephone Encounter (Signed)
-----   Message from Herminio Commons, MD sent at 10/02/2018 12:05 PM EDT ----- K is low. Have him take KCl 40 meq today and tomorrow followed by 20 meq daily and repeat BMET on 7/13.

## 2018-10-02 NOTE — Telephone Encounter (Signed)
Mailed lab slip, pt will start potassium as directed

## 2018-10-03 ENCOUNTER — Telehealth: Payer: Self-pay | Admitting: Cardiovascular Disease

## 2018-10-03 MED ORDER — METOLAZONE 2.5 MG PO TABS: ORAL_TABLET | ORAL | 0 refills | Status: DC

## 2018-10-03 NOTE — Telephone Encounter (Signed)
   By review of notes, Dr. Bronson Ing had him take Metolazone 2.79m on 7/7 and said he could repeat the next day. How many doses has he taken? Can take an extra tablet today and on Sunday if needed (may need additional tablets sent in). Keep plans for repeat BMET on Monday as already ordered. Would recommend utilizing compression stockings and elevating his lower extremities as much as possible. Continue to monitor sodium intake and limit fluid intake to 1.5 - 2.0 L per day.   Signed, BErma Heritage PA-C 10/03/2018, 1:53 PM Pager: [phone]

## 2018-10-03 NOTE — Telephone Encounter (Signed)
Patient is calling with c/o swelling in legs and feet. States that weight is same and no c/o SOB. / tg

## 2018-10-03 NOTE — Telephone Encounter (Signed)
Returned pt call, he states that he is having a lot of swelling in his legs. He is taking torsemide 40 mg- two times daily. His weight is unchanged at 260 lbs. He has not eaten anything out of his normal recently. He eats a lot of garden vegetables. He is not having any SOB, chest pain, or rapid heart rates. Please advise.

## 2018-10-03 NOTE — Telephone Encounter (Signed)
Spoke with pt. He states that he took 2 of the metoazone 2.5 mg. He will get labs done on Monday as ordered. Sent in refill on metolazone 2.5. He is wearing his compression stockings often and tried elevating his legs, but makes his hip hurt once he does it for so long.  Pt voiced understanding of recommendations.

## 2018-10-06 ENCOUNTER — Emergency Department (HOSPITAL_COMMUNITY)
Admission: EM | Admit: 2018-10-06 | Discharge: 2018-10-07 | Disposition: A | Discharge status: Home / Self Care | Payer: Medicare HMO | Attending: Emergency Medicine | Admitting: Emergency Medicine

## 2018-10-06 ENCOUNTER — Encounter (HOSPITAL_COMMUNITY): Payer: Self-pay | Admitting: Emergency Medicine

## 2018-10-06 ENCOUNTER — Emergency Department (HOSPITAL_COMMUNITY): Payer: Medicare HMO

## 2018-10-06 ENCOUNTER — Other Ambulatory Visit: Payer: Self-pay

## 2018-10-06 ENCOUNTER — Other Ambulatory Visit (HOSPITAL_COMMUNITY)
Admission: RE | Admit: 2018-10-06 | Discharge: 2018-10-06 | Disposition: A | Discharge status: Home / Self Care | Payer: Medicare HMO | Source: Ambulatory Visit | Attending: Cardiovascular Disease | Admitting: Cardiovascular Disease

## 2018-10-06 DIAGNOSIS — Z87891 Personal history of nicotine dependence: Secondary | ICD-10-CM | POA: Diagnosis not present

## 2018-10-06 DIAGNOSIS — Z7901 Long term (current) use of anticoagulants: Secondary | ICD-10-CM | POA: Insufficient documentation

## 2018-10-06 DIAGNOSIS — I5042 Chronic combined systolic (congestive) and diastolic (congestive) heart failure: Secondary | ICD-10-CM | POA: Diagnosis not present

## 2018-10-06 DIAGNOSIS — Z79899 Other long term (current) drug therapy: Secondary | ICD-10-CM | POA: Diagnosis not present

## 2018-10-06 DIAGNOSIS — R600 Localized edema: Secondary | ICD-10-CM | POA: Insufficient documentation

## 2018-10-06 DIAGNOSIS — E876 Hypokalemia: Secondary | ICD-10-CM | POA: Insufficient documentation

## 2018-10-06 DIAGNOSIS — M79651 Pain in right thigh: Secondary | ICD-10-CM | POA: Diagnosis not present

## 2018-10-06 DIAGNOSIS — J984 Other disorders of lung: Secondary | ICD-10-CM | POA: Diagnosis not present

## 2018-10-06 DIAGNOSIS — I4891 Unspecified atrial fibrillation: Secondary | ICD-10-CM | POA: Diagnosis not present

## 2018-10-06 DIAGNOSIS — I110 Hypertensive heart disease with heart failure: Secondary | ICD-10-CM | POA: Insufficient documentation

## 2018-10-06 DIAGNOSIS — E119 Type 2 diabetes mellitus without complications: Secondary | ICD-10-CM | POA: Insufficient documentation

## 2018-10-06 DIAGNOSIS — M7989 Other specified soft tissue disorders: Secondary | ICD-10-CM | POA: Diagnosis not present

## 2018-10-06 DIAGNOSIS — Z7984 Long term (current) use of oral hypoglycemic drugs: Secondary | ICD-10-CM | POA: Diagnosis not present

## 2018-10-06 LAB — BASIC METABOLIC PANEL
Anion gap: 16 — ABNORMAL HIGH (ref 5–15)
Anion gap: 17 — ABNORMAL HIGH (ref 5–15)
BUN: 30 mg/dL — ABNORMAL HIGH (ref 8–23)
BUN: 29 mg/dL — ABNORMAL HIGH (ref 8–23)
CO2: 30 mmol/L (ref 22–32)
CO2: 27 mmol/L (ref 22–32)
Calcium: 9.9 mg/dL (ref 8.9–10.3)
Calcium: 9.7 mg/dL (ref 8.9–10.3)
Chloride: 89 mmol/L — ABNORMAL LOW (ref 98–111)
Chloride: 89 mmol/L — ABNORMAL LOW (ref 98–111)
Creatinine, Ser: 1.31 mg/dL — ABNORMAL HIGH (ref 0.61–1.24)
Creatinine, Ser: 1.6 mg/dL — ABNORMAL HIGH (ref 0.61–1.24)
GFR calc Af Amer: >60 mL/min (ref >60)
GFR calc Af Amer: 49 mL/min — ABNORMAL LOW (ref >60)
GFR calc non Af Amer: 54 mL/min — ABNORMAL LOW (ref >60)
GFR calc non Af Amer: 42 mL/min — ABNORMAL LOW (ref >60)
Glucose, Bld: 164 mg/dL — ABNORMAL HIGH (ref 70–99)
Glucose, Bld: 223 mg/dL — ABNORMAL HIGH (ref 70–99)
Potassium: 3.1 mmol/L — ABNORMAL LOW (ref 3.5–5.1)
Potassium: 3 mmol/L — ABNORMAL LOW (ref 3.5–5.1)
Sodium: 135 mmol/L (ref 135–145)
Sodium: 133 mmol/L — ABNORMAL LOW (ref 135–145)

## 2018-10-06 LAB — CBC
HCT: 40.9 % (ref 39.0–52.0)
Hemoglobin: 13.8 g/dL (ref 13.0–17.0)
MCH: 28.5 pg (ref 26.0–34.0)
MCHC: 33.7 g/dL (ref 30.0–36.0)
MCV: 84.3 fL (ref 80.0–100.0)
Platelets: 132 10*3/uL — ABNORMAL LOW (ref 150–400)
RBC: 4.85 MIL/uL (ref 4.22–5.81)
RDW: 16.8 % — ABNORMAL HIGH (ref 11.5–15.5)
WBC: 7.8 10*3/uL (ref 4.0–10.5)
nRBC: 0 % (ref 0.0–0.2)

## 2018-10-06 LAB — MAGNESIUM: Magnesium: 2.3 mg/dL (ref 1.7–2.4)

## 2018-10-06 LAB — CBG MONITORING, ED: Glucose-Capillary: 203 mg/dL — ABNORMAL HIGH (ref 70–99)

## 2018-10-06 LAB — BRAIN NATRIURETIC PEPTIDE: B Natriuretic Peptide: 1072.2 pg/mL — ABNORMAL HIGH (ref 0.0–100.0)

## 2018-10-06 LAB — CK: Total CK: 145 U/L (ref 49–397)

## 2018-10-06 MED ORDER — POTASSIUM CHLORIDE CRYS ER 20 MEQ PO TBCR
40.0000 meq | EXTENDED_RELEASE_TABLET | Freq: Once | ORAL | Status: AC
  Administered 2018-10-06: 40 meq via ORAL
  Filled 2018-10-06: qty 2

## 2018-10-06 MED ORDER — SODIUM CHLORIDE 0.9% FLUSH
3.0000 mL | Freq: Once | INTRAVENOUS | Status: AC
  Administered 2018-10-06: 3 mL via INTRAVENOUS

## 2018-10-06 MED ORDER — FUROSEMIDE 10 MG/ML IJ SOLN
40.0000 mg | Freq: Once | INTRAMUSCULAR | Status: AC
  Administered 2018-10-06: 21:00:00 40 mg via INTRAVENOUS
  Filled 2018-10-06: qty 4

## 2018-10-06 NOTE — ED Notes (Signed)
[phone]- home -wife

## 2018-10-06 NOTE — ED Notes (Signed)
CBG 203

## 2018-10-06 NOTE — ED Notes (Signed)
Lelan Pons -wife- [phone]

## 2018-10-06 NOTE — ED Triage Notes (Signed)
Pt states he has been having right upper thigh pain and bilateral lower leg swelling. Pt states the pain and swelling both started a week ago. Denies SOb/CP.Pt states he has CHF

## 2018-10-06 NOTE — ED Provider Notes (Addendum)
[facility] EMERGENCY DEPARTMENT Provider Note   CSN: [id number] Arrival date & time: 10/06/18  1318    History   Chief Complaint Chief Complaint  Patient presents with   Leg Swelling    HPI John [name] is a 73 y.o. male.     HPI   John [name] is a 73 y.o. male, with a history of a flutter, CHF, obesity, DM, HTN, hyperlipidemia, presenting to the ED with right hip pain beginning about a week ago. Occurs when he lays down or sits down, not present with ambulation.  Pain is a sudden cramping, moderate to severe, lasting for a few seconds to a minute at a time, radiating to the upper right thigh.  Also mentions persistent bilateral lower extremity swelling for at least the last three weeks. He has been admitted recently for this and has been seen in follow up with cardiology. They have increased his toresemide from 38m in morning and 230mat night to 4051mID.  Metolazone was also added to patient's regimen about a week ago. He takes this every other day.   Denies fever/chills, shortness of breath, chest pain, acute orthopnea, N/V/D, abdominal pain, cough, falls/trauma, weakness/numbness, or any other complaints.      Past Medical History:  Diagnosis Date   Atrial flutter (HCCWinterville0/2012   Admitted with symptomatic bradycardia (HR 40s), atrial flutter with slow ventricular response 12/2010 + volume overload; AV nodal agents d/c'd and Pradaxa started; RFA in 01/2011   CHF (congestive heart failure) (HCC)    Chronic combined systolic and diastolic heart failure (HCC[place]  echo 01/06/11: mild LVH, EF 65-70%, mod to severe LAE, mild RVE, mild RAE, PASP 32;   TEE 10/12: EF 45-50%    Class 2 severe obesity due to excess calories with serious comorbidity and body mass index (BMI) of 37.0 to 37.9 in adult (HCCMil[place]/24/2019   Diabetes mellitus    non insulin dependant   Hyperlipidemia    Hypertension    Osteoarthritis    Presence of permanent cardiac  pacemaker    PSVT (paroxysmal supraventricular tachycardia) (HCC)    Possibly atrial flutter    Patient Active Problem List   Diagnosis Date Noted   CHF (congestive heart failure) (HCCNicholson5/26/2020   Stage 3 chronic kidney disease (HCC)    Paroxysmal atrial fibrillation (HCC)    Acute on chronic systolic CHF (congestive heart failure) (HCCLebam2/10/2017   Pacemaker 03/02/2018   History of colonic polyps 12/28/2016   Atrial pacemaker lead displacement 12/07/2015   Acute CHF (congestive heart failure) (HCCRinggold8/28/2017   CHF, acute on chronic (HCCKetchum8/27/2017   Complete heart block (HCCHickman8/27/2017   Chronic diastolic CHF (congestive heart failure) (HCCParagon Estates6/06/2015   Hypertensive heart disease 08/28/2015   NSVT (nonsustained ventricular tachycardia) (HCCSummit6/06/2015   Elevated troponin 08/28/2015   Second degree heart block 08/25/2015   Bradycardia 06/39/03/0092Acute diastolic CHF (congestive heart failure) (HCCUlysses6/03/2015   Current use of long term anticoagulation 05/04/2011   Atrial flutter (HCCMiddleville0/02/2011   DM type 2 causing vascular disease (HCCPiney Green9/30/2011   Mixed hyperlipidemia 12/23/2009   Essential hypertension 12/23/2009   DEGENERATIVE JOINT DISEASE 12/23/2009    Past Surgical History:  Procedure Laterality Date   ATRIAL FLUTTER ABLATION N/A 02/07/2011   Procedure: ATRIAL FLUTTER ABLATION;  Surgeon: GreEvans LanceD;  [facility];  Service: Cardiovascular;  Laterality: N/A;   CARDIAC ELECTROPHYSIOLOGY STUDY AND ABLATION  02/07/11   CARDIOVERSION N/A 03/23/2016   Procedure: CARDIOVERSION;  Surgeon: Evans Lance, MD;  Location: [place];  Service: Cardiovascular;  Laterality: N/A;   COLONOSCOPY N/A 04/17/2017   Procedure: COLONOSCOPY;  Surgeon: Rogene Houston, MD;  [facility];  Service: Endoscopy;  Laterality: N/A;  [place] N/A 08/29/2015   Procedure: Pacemaker Implant;  Surgeon: Evans Lance, MD;  Location: [place] CV LAB;  Service: Cardiovascular;  Laterality: N/A;   EP IMPLANTABLE DEVICE N/A 12/07/2015   Procedure: PPM Lead Revision/Repair;  Surgeon: Evans Lance, MD;  Location: [place] CV LAB;  Service: Cardiovascular;  Laterality: N/A;   KNEE ARTHROSCOPY  2005   left   LUMBAR SPINE SURGERY     "I've had 6 ORs 1972 thru 2004"   POLYPECTOMY  04/17/2017   Procedure: POLYPECTOMY;  Surgeon: Rogene Houston, MD;  Location: [facility];  Service: Endoscopy;;  transverse colon x3;        Home Medications    Prior to Admission medications   Medication Sig Start Date End Date Taking? Authorizing Provider  acetaminophen (TYLENOL) 500 MG tablet Take 1,000 mg by mouth every 6 (six) hours as needed for moderate pain (thigh pain).   Yes [provider]  amLODipine (NORVASC) 10 MG tablet Take 1 tablet (10 mg total) by mouth daily. One daily 06/30/18  Yes Mikey Kirschner, MD  benzonatate (TESSALON) 100 MG capsule TAKE 1 CAPSULE (100 MG TOTAL) BY MOUTH EVERY 6 (SIX) HOURS AS NEEDED FOR COUGH. 09/09/18 09/09/19 Yes Lendon Colonel, NP  carvedilol (COREG) 25 MG tablet TAKE 1 TABLET (25 MG TOTAL) BY MOUTH 2 (TWO) TIMES DAILY WITH A MEAL. 09/18/18  Yes Herminio Commons, MD  ELIQUIS 5 MG TABS tablet TAKE 1 TABLET TWICE DAILY Patient taking differently: Take 5 mg by mouth 2 (two) times daily.  05/14/18  Yes Evans Lance, MD  glucose blood test strip 1 each by Other route as needed. Use as instructed bid 07/23/17  Yes Nida, Marella Chimes, MD  Insulin Degludec (TRESIBA FLEXTOUCH) 200 UNIT/ML SOPN Inject 60 Units into the skin at bedtime. 06/18/18  Yes Nida, Marella Chimes, MD  Insulin Pen Needle 31G X 8 MM MISC USE EVERY DAY AS DIRECTED BY PHYSICIAN 03/20/18  Yes Nida, Marella Chimes, MD  Magnesium 400 MG CAPS Take 400 mg by mouth daily.    Yes [provider]  metFORMIN (GLUCOPHAGE) 500 MG tablet Take 1 tablet (500 mg total) by mouth 2 (two) times  daily with a meal. 09/19/18  Yes Nida, Marella Chimes, MD  metolazone (ZAROXOLYN) 2.5 MG tablet Take 1 tablet 2.5 mg today.May repeat tomorrow if you weight has not gone back to your baseline Patient taking differently: Take 2.5 mg by mouth as needed (fluid). Take 1 tablet 2.5 mg with other fluid pill. May repeat tomorrow if your weight has not gone back to your baseline 10/03/18  Yes Strader, [place], PA-C  Multiple Vitamins-Minerals (MULTIVITAMIN WITH MINERALS) tablet Take 1 tablet by mouth daily.   Yes [provider]  potassium chloride SA (KLOR-CON M20) 20 MEQ tablet Take 40 meq (2 tablets) NOW and then take 20 meq daily Patient taking differently: Take 20 mEq by mouth daily. Take 40 meq (2 tablets) NOW and then take 20 meq daily 10/02/18  Yes Herminio Commons, MD  Resveratrol 100 MG CAPS Take 100 mg by mouth at bedtime.    Yes [provider]  rosuvastatin (CRESTOR) 20 MG tablet Take 1 tablet (20 mg total) by mouth daily. 06/30/18  Yes Mikey Kirschner, MD  torsemide (DEMADEX) 20 MG tablet Take 2 tablets (40 mg total) by mouth 2 (two) times daily. 08/22/18  Yes Reino Bellis B, NP  TRULICITY 1.5 IF/0.2DX SOPN INJECT  1.5MG (CONTENTS OF 1 PEN)  INTO  THE  SKIN ONE TIME WEEKLY Patient taking differently: Inject 1.5 mg into the skin once a week.  07/10/18  Yes Cassandria Anger, MD  torsemide (DEMADEX) 20 MG tablet Take 40 mg in the AM and Take 20 mg in the PM 09/24/18   Strader, [place], PA-C    Family History Family History  Problem Relation Age of Onset   Heart attack Mother    Hypertension Mother    Heart attack Father    Hypertension Father    Heart attack Brother    Colon cancer Neg Hx     Social History Social History   Tobacco Use   Smoking status: Former Smoker    Packs/day: 1.00    Years: 10.00    Pack years: 10.00    Types: Cigarettes    Quit date: 03/26/1986    Years since quitting: 32.5   Smokeless tobacco: Never Used   Tobacco  comment: "stopped cigarette  smoking 1988"  Substance Use Topics   Alcohol use: Never    Alcohol/week: 0.0 standard drinks    Frequency: Never    Comment: "quit alcohol ~ 2007"   Drug use: No     Allergies   Patient has no known allergies.   Review of Systems Review of Systems  Constitutional: Negative for chills, diaphoresis and fever.  Respiratory: Negative for cough and shortness of breath.   Cardiovascular: Positive for leg swelling. Negative for chest pain.  Gastrointestinal: Negative for abdominal pain, diarrhea, nausea and vomiting.  Genitourinary: Negative for difficulty urinating, dysuria and hematuria.  Musculoskeletal: Positive for arthralgias.  Neurological: Negative for weakness and numbness.  All other systems reviewed and are negative.    Physical Exam Updated Vital Signs BP (!) 122/55 (BP Location: Left Arm)    Pulse 77    Temp 98 F (36.7 C) (Oral)    Resp 19    Ht 5' 11"  (1.803 m)    Wt 112.9 kg    SpO2 97%    BMI 34.73 kg/m   Physical Exam Vitals signs and nursing note reviewed.  Constitutional:      General: He is not in acute distress.    Appearance: He is well-developed. He is not diaphoretic.  HENT:     Head: Normocephalic and atraumatic.     Mouth/Throat:     Mouth: Mucous membranes are moist.     Pharynx: Oropharynx is clear.  Eyes:     Conjunctiva/sclera: Conjunctivae normal.  Neck:     Musculoskeletal: Neck supple.  Cardiovascular:     Rate and Rhythm: Normal rate and regular rhythm.     Pulses: Normal pulses.          Radial pulses are 2+ on the right side and 2+ on the left side.       Posterior tibial pulses are 2+ on the right side and 2+ on the left side.     Heart sounds: Normal heart sounds.     Comments: Tactile temperature in the extremities appropriate and equal bilaterally. Pulmonary:     Effort: Pulmonary effort is normal. No respiratory distress.  Breath sounds: Normal breath sounds.  Abdominal:     Palpations:  Abdomen is soft.     Tenderness: There is no abdominal tenderness. There is no guarding.  Musculoskeletal:     Right lower leg: Edema present.     Left lower leg: Edema present.     Comments: Tenderness to anterior right hip and upper thigh.  Pain with ROM right hip.  No noted swelling, deformity, color change, or increased warmth. No pain, tenderness, increased warmth, or color change to the rest of the upper right leg or calf.  Lymphadenopathy:     Cervical: No cervical adenopathy.  Skin:    General: Skin is warm and dry.  Neurological:     Mental Status: He is alert.     Comments: Sensation grossly intact to light touch in the lower extremities bilaterally. No saddle anesthesias. Strength 5/5 in the bilateral lower extremities. Ambulatory without noted difficulty. Coordination intact with heel to shin testing.  Psychiatric:        Mood and Affect: Mood and affect normal.        Speech: Speech normal.        Behavior: Behavior normal.      ED Treatments / Results  Labs (all labs ordered are listed, but only abnormal results are displayed) Labs Reviewed  BASIC METABOLIC PANEL - Abnormal; Notable for the following components:      Result Value   Sodium 133 (*)    Potassium 3.0 (*)    Chloride 89 (*)    Glucose, Bld 223 (*)    BUN 29 (*)    Creatinine, Ser 1.60 (*)    GFR calc non Af Amer 42 (*)    GFR calc Af Amer 49 (*)    Anion gap 17 (*)    All other components within normal limits  CBC - Abnormal; Notable for the following components:   RDW 16.8 (*)    Platelets 132 (*)    All other components within normal limits  BRAIN NATRIURETIC PEPTIDE - Abnormal; Notable for the following components:   B Natriuretic Peptide 1,072.2 (*)    All other components within normal limits  CBG MONITORING, ED - Abnormal; Notable for the following components:   Glucose-Capillary 203 (*)    All other components within normal limits  MAGNESIUM  CK    BUN  Date Value Ref Range  Status  10/06/2018 29 (H) 8 - 23 mg/dL Final  10/06/2018 30 (H) 8 - 23 mg/dL Final  10/02/2018 25 (H) 8 - 23 mg/dL Final  09/19/2018 25 (H) 8 - 23 mg/dL Final  09/12/2018 26 8 - 27 mg/dL Final  09/04/2018 46 (H) 8 - 27 mg/dL Final  08/29/2018 34 (H) 8 - 27 mg/dL Final  08/11/2018 18 8 - 27 mg/dL Final   Creat  Date Value Ref Range Status  03/07/2016 1.32 (H) 0.70 - 1.18 mg/dL Final    Comment:      For patients > or = 73 years of age: The upper reference limit for Creatinine is approximately 13% higher for people identified as African-American.     11/16/2013 0.94 0.50 - 1.35 mg/dL Final  01/31/2011 1.10 0.50 - 1.35 mg/dL Final   Creatinine, Ser  Date Value Ref Range Status  10/06/2018 1.60 (H) 0.61 - 1.24 mg/dL Final  10/06/2018 1.31 (H) 0.61 - 1.24 mg/dL Final  10/02/2018 1.46 (H) 0.61 - 1.24 mg/dL Final  09/19/2018 1.30 (H) 0.61 - 1.24 mg/dL Final  EKG EKG Interpretation  Date/Time:  Monday October 06 2018 13:38:35 EDT Ventricular Rate:  75 PR Interval:    QRS Duration: 180 QT Interval:  506 QTC Calculation: 565 R Axis:   -102 Text Interpretation:  paced  Ventricular-paced rhythm with frequent and consecutive Premature ventricular complexes Abnormal ECG Confirmed by Wandra Arthurs [id number]) on 10/06/2018 7:45:52 PM   Radiology Dg Chest 2 View  Result Date: 10/06/2018 CLINICAL DATA:  Pain.  Cardiac arrhythmia EXAM: CHEST - 2 VIEW COMPARISON:  Aug 13, 2018 and February 25, 2018 FINDINGS: There is mild scarring in the right mid lung and basilar regions. There is interstitial prominence with suspected mild bibasilar interstitial edema. There is no consolidation. Heart is mildly enlarged with pacemaker leads attached to the right atrium and right ventricle. No adenopathy. There is aortic atherosclerosis. No bone lesions. IMPRESSION: Areas of mild scarring. Interstitial thickening may represent mild chronic interstitial edema. No consolidation. Stable cardiomegaly. Pacemaker  leads attached to right atrium and right ventricle. No adenopathy. Aortic Atherosclerosis (ICD10-I70.0). Electronically Signed   By: Lowella Grip III M.D.   On: 10/06/2018 14:03   Dg Hip Unilat W Or Wo Pelvis 2-3 Views Right  Result Date: 10/06/2018 CLINICAL DATA:  Anterior right thigh pain and bilateral lower extremity swelling. No reported injury. EXAM: DG HIP (WITH OR WITHOUT PELVIS) 2-3V RIGHT COMPARISON:  CT abdomen pelvis 10/29/2004 FINDINGS: Geometric fragment adjacent the right ischial tuberosity has very well corticated margins and is likely remote posttraumatic or enthesopathic in nature. Additional coarsened the sub pathic mineralization is are seen along both ischial tuberosities and greater trochanters. No acute fracture or traumatic malalignment is evident. Periacetabular spurring is noted bilaterally. Degenerative changes noted in the imaged lumbar spine. Evaluation of sacrum is limited due to overlying bowel gas. Vascular calcium noted in the pelvis and medial thighs. Bowel gas pattern is unremarkable. Surgical clip projects over the lower abdomen. IMPRESSION: No acute osseous abnormality. Electronically Signed   By: MD Lovena Le   On: 10/06/2018 20:19    Procedures Procedures (including critical care time)  Medications Ordered in ED Medications  sodium chloride flush (NS) 0.9 % injection 3 mL (3 mLs Intravenous Given 10/06/18 2036)  potassium chloride SA (K-DUR) CR tablet 40 mEq (40 mEq Oral Given 10/06/18 2035)  furosemide (LASIX) injection 40 mg (40 mg Intravenous Given 10/06/18 2035)     Initial Impression / Assessment and Plan / ED Course  I have reviewed the triage vital signs and the nursing notes.  Pertinent labs & imaging results that were available during my care of the patient were reviewed by me and considered in my medical decision making (see chart for details).  Clinical Course as of Oct 07 2302  Mon Oct 06, 2018  1903 Improved from previous value of 85 a  month ago.  Platelets(!): 132 [SJ]  1904 Noted to be 3.1 about 9 hours ago and 4 days ago.  Potassium(!): 3.0 [SJ]    Clinical Course User Index [SJ] Genola Yuille C, PA-C       Patient presents with continued bilateral lower extremity edema.  No acute changes in this complaint.  Also complains of some right hip pain without trauma.  No evidence of neurovascular compromise.  X-ray without acute abnormality. Low suspicion for DVT due to pain resolving with ambulation, patient anticoagulated on Eliquis, and exam is not suggestive. Hypokalemia noted, consistent with previous value.  BNP elevated to 1072, but improved from recent value of 1285.  No  chest pain, shortness of breath, orthopnea.  Chest x-ray without acute abnormality. Mild increase in creatinine from 1.31 to 1.60, may be due to recent increase in patient's torsemide. We will have patient continue with his current medication regimen, retest kidney function as well as follow-up with cardiology this week. Return precautions discussed.  Patient voices understanding of these instructions, accepts the plan, and is comfortable with discharge.  Findings and plan of care discussed with Shirlyn Goltz, MD. Dr. Darl Householder personally evaluated and examined this patient.  Vitals:   10/06/18 1339 10/06/18 1340 10/06/18 1730 10/07/18 0007  BP: 137/61  (!) 122/55 101/77  Pulse: (!) 125  77 72  Resp: 15  19 17   Temp:  98 F (36.7 C)    TempSrc:  Oral    SpO2: 96%  97% 96%  Weight:      Height:         Final Clinical Impressions(s) / ED Diagnoses   Final diagnoses:  Bilateral lower extremity edema    ED Discharge Orders    None       Layla Maw 10/07/18 2310    Lorayne Bender, PA-C 10/07/18 2310    Drenda Freeze, MD 10/08/18 [phone]

## 2018-10-07 ENCOUNTER — Telehealth: Payer: Self-pay | Admitting: Cardiovascular Disease

## 2018-10-07 NOTE — Telephone Encounter (Signed)
Pt called stating he's losing 5-6 lbs of weight over night and that he's having pain in his thigh area and that he'd like to see Dr. Bronson Ing sooner than his apt on 7/30.  State he was seen in the ER at [facility] last night.

## 2018-10-07 NOTE — Telephone Encounter (Signed)
Apt made for 10/17/18 at 3:30 pm with Maryla Morrow, NP at the [facility] office   Patient will take Potassium 60 meq today and then see Ms.Phylliss Bob

## 2018-10-07 NOTE — ED Notes (Signed)
Pt discharged with all belongings. Discharge instructions reviewed with pt, and pt verbalized understanding. Opportunity for questions provided.

## 2018-10-07 NOTE — Telephone Encounter (Signed)
I reviewed notes, blood tests, and imaging studies which she had done in the [facility] ED.  His potassium was low which likely explains some of the pain he is having.  Please have him take 60 mEq of potassium chloride today.  When is he scheduled to get another basic metabolic panel?  Unfortunately I am out of the office next week.  Please schedule him with an APP at any location within the next 7 days.

## 2018-10-07 NOTE — Discharge Instructions (Signed)
Have kidney function retested sometime this week. Keep taking your medications, as prescribed. Follow-up with cardiology this week.

## 2018-10-08 ENCOUNTER — Other Ambulatory Visit: Payer: Self-pay | Admitting: "Endocrinology

## 2018-10-08 ENCOUNTER — Other Ambulatory Visit: Payer: Self-pay

## 2018-10-09 ENCOUNTER — Encounter: Payer: Self-pay | Admitting: Family Medicine

## 2018-10-09 ENCOUNTER — Ambulatory Visit (INDEPENDENT_AMBULATORY_CARE_PROVIDER_SITE_OTHER): Payer: Medicare HMO | Admitting: Family Medicine

## 2018-10-09 VITALS — BP 134/88 | Temp 98.2°F | Wt 247.4 lb

## 2018-10-09 DIAGNOSIS — G5711 Meralgia paresthetica, right lower limb: Secondary | ICD-10-CM

## 2018-10-09 NOTE — Patient Instructions (Signed)
The inflammation and nerve pain of this area of the thigh is called neuralgia paraesthetica and sometime meralgia paraesthetica by the neurologist, it is a painful neuropathy of the nerve going to the front part of the thigh

## 2018-10-09 NOTE — Progress Notes (Signed)
   Subjective:    Patient ID: John [name], male    DOB: [birth year], 73 y.o.   MRN: [id number]  HPI Pt here today for ED follow up. Pt went to [facility] ER on 10/06/2018 for leg swelling. Pt states he is doing ok today.Pt legs are still swollen.  Pt states he is having some right thigh pain. Been going on about 2 weeks. No trouble breathing and no shortness of breath.   Emergency room note reviewed.  Patient presented with swelling on the legs.  Had a substantially elevated BNP.  Was felt unlikely to experiencing DVT.  Creatinine was up somewhat.  Benazepril had been stopped based on this  Patient's main presenting complaint is right anterior thigh pain.  Fairly severe at times.  Sugars have been up a bit.  Pain worse in the evening time.  No noticeable weakness Review of Systems No headache, no major weight loss or weight gain, no chest pain no back pain abdominal pain no change in bowel habits complete ROS otherwise negative     Objective:   Physical Exam  Alert and oriented, vitals reviewed and stable, NAD ENT-TM's and ext canals WNL bilat via otoscopic exam Soft palate, tonsils and post pharynx WNL via oropharyngeal exam Neck-symmetric, no masses; thyroid nonpalpable and nontender Pulmonary-no tachypnea or accessory muscle use; Clear without wheezes via auscultation Card--no abnrml murmurs, rhythm reg and rate WNL Carotid pulses symmetric, without bruits Ankles 1+ edema bilateral right anterior lateral thigh question paresthesia to light touch      Assessment & Plan:  Impression probable meralgia paresthetica.  Discussed at length.  Worse at night.  Patient does have risk factors with obesity and diabetes.  Initiate nighttime pain therapy.  Discussed potential neurology referral and major work-up patient declines at this time  2.  Peripheral edema multifactorial.  CHF does plan to add as does renal insufficiency.  Advised patient I think with his complexity really should be managed  by his cardiologist in this regard  Greater than 50% of this 25 minute face to face visit was spent in counseling and discussion and coordination of care regarding the above diagnosis/diagnosies

## 2018-10-10 ENCOUNTER — Telehealth: Payer: Self-pay | Admitting: *Deleted

## 2018-10-10 DIAGNOSIS — Z79899 Other long term (current) drug therapy: Secondary | ICD-10-CM

## 2018-10-10 MED ORDER — POTASSIUM CHLORIDE CRYS ER 20 MEQ PO TBCR: 40.0000 meq | EXTENDED_RELEASE_TABLET | Freq: Every day | ORAL | 3 refills | Status: DC

## 2018-10-10 MED ORDER — HYDROCODONE-ACETAMINOPHEN 5-325 MG PO TABS: ORAL_TABLET | ORAL | 0 refills | Status: DC

## 2018-10-10 NOTE — Telephone Encounter (Signed)
-----   Message from Herminio Commons, MD sent at 10/06/2018  2:50 PM EDT ----- Renal function has improved.  Potassium remains low.  Have him take potassium chloride 40 mEq twice daily for 3 days followed by 40 mEq daily.  Repeat basic metabolic panel in 1 week.

## 2018-10-10 NOTE — Telephone Encounter (Signed)
Pt.notified

## 2018-10-14 ENCOUNTER — Other Ambulatory Visit (HOSPITAL_COMMUNITY)
Admission: RE | Admit: 2018-10-14 | Discharge: 2018-10-14 | Disposition: A | Discharge status: Home / Self Care | Payer: Medicare HMO | Source: Ambulatory Visit | Attending: Cardiovascular Disease | Admitting: Cardiovascular Disease

## 2018-10-14 DIAGNOSIS — E876 Hypokalemia: Secondary | ICD-10-CM | POA: Diagnosis not present

## 2018-10-14 LAB — BASIC METABOLIC PANEL
Anion gap: 11 (ref 5–15)
BUN: 25 mg/dL — ABNORMAL HIGH (ref 8–23)
CO2: 24 mmol/L (ref 22–32)
Calcium: 9.4 mg/dL (ref 8.9–10.3)
Chloride: 98 mmol/L (ref 98–111)
Creatinine, Ser: 1.45 mg/dL — ABNORMAL HIGH (ref 0.61–1.24)
GFR calc Af Amer: 55 mL/min — ABNORMAL LOW (ref >60)
GFR calc non Af Amer: 48 mL/min — ABNORMAL LOW (ref >60)
Glucose, Bld: 173 mg/dL — ABNORMAL HIGH (ref 70–99)
Potassium: 5.1 mmol/L (ref 3.5–5.1)
Sodium: 133 mmol/L — ABNORMAL LOW (ref 135–145)

## 2018-10-15 ENCOUNTER — Other Ambulatory Visit: Payer: Self-pay | Admitting: "Endocrinology

## 2018-10-15 ENCOUNTER — Ambulatory Visit (INDEPENDENT_AMBULATORY_CARE_PROVIDER_SITE_OTHER): Payer: Medicare HMO | Admitting: *Deleted

## 2018-10-15 DIAGNOSIS — I442 Atrioventricular block, complete: Secondary | ICD-10-CM

## 2018-10-15 DIAGNOSIS — I480 Paroxysmal atrial fibrillation: Secondary | ICD-10-CM

## 2018-10-15 LAB — CUP PACEART REMOTE DEVICE CHECK
Battery Impedance: 159 Ohm
Battery Remaining Longevity: 110 mo
Battery Voltage: 2.79 V
Brady Statistic AP VP Percent: 18 %
Brady Statistic AP VS Percent: 0 %
Brady Statistic AS VP Percent: 35 %
Brady Statistic AS VS Percent: 46 %
Date Time Interrogation Session: 20200722113700
Implantable Lead Implant Date: 20170605
Implantable Lead Implant Date: 20170605
Implantable Lead Location: 753859
Implantable Lead Location: 753860
Implantable Lead Model: 5076
Implantable Lead Model: 5076
Implantable Pulse Generator Implant Date: 20170605
Lead Channel Impedance Value: 388 Ohm
Lead Channel Impedance Value: 508 Ohm
Lead Channel Pacing Threshold Amplitude: 0.625 V
Lead Channel Pacing Threshold Amplitude: 0.875 V
Lead Channel Pacing Threshold Pulse Width: 0.4 ms
Lead Channel Pacing Threshold Pulse Width: 0.4 ms
Lead Channel Setting Pacing Amplitude: 2 V
Lead Channel Setting Pacing Amplitude: 2.5 V
Lead Channel Setting Pacing Pulse Width: 0.4 ms
Lead Channel Setting Sensing Sensitivity: 4 mV

## 2018-10-16 ENCOUNTER — Telehealth: Payer: Self-pay | Admitting: Cardiovascular Disease

## 2018-10-16 NOTE — Telephone Encounter (Signed)

## 2018-10-17 ENCOUNTER — Ambulatory Visit (INDEPENDENT_AMBULATORY_CARE_PROVIDER_SITE_OTHER): Payer: Medicare HMO | Admitting: Cardiology

## 2018-10-17 ENCOUNTER — Other Ambulatory Visit: Payer: Self-pay

## 2018-10-17 ENCOUNTER — Encounter: Payer: Self-pay | Admitting: Cardiology

## 2018-10-17 VITALS — BP 104/63 | HR 81 | Temp 98.5°F | Ht 71.0 in | Wt 268.2 lb

## 2018-10-17 DIAGNOSIS — Z950 Presence of cardiac pacemaker: Secondary | ICD-10-CM | POA: Diagnosis not present

## 2018-10-17 DIAGNOSIS — I350 Nonrheumatic aortic (valve) stenosis: Secondary | ICD-10-CM | POA: Diagnosis not present

## 2018-10-17 DIAGNOSIS — I5042 Chronic combined systolic (congestive) and diastolic (congestive) heart failure: Secondary | ICD-10-CM

## 2018-10-17 DIAGNOSIS — I4821 Permanent atrial fibrillation: Secondary | ICD-10-CM

## 2018-10-17 DIAGNOSIS — I10 Essential (primary) hypertension: Secondary | ICD-10-CM | POA: Diagnosis not present

## 2018-10-17 DIAGNOSIS — E785 Hyperlipidemia, unspecified: Secondary | ICD-10-CM

## 2018-10-17 MED ORDER — TORSEMIDE 20 MG PO TABS: ORAL_TABLET | ORAL | 3 refills | Status: DC

## 2018-10-17 NOTE — Progress Notes (Signed)
Cardiology Office Note:    Date:  10/17/2018   ID:  John [name], DOB 07/05/1945, MRN [id number]  PCP:  Mikey Kirschner, MD  Cardiologist:  [name], MD  Referring MD: Mikey Kirschner, MD   Chief Complaint  Patient presents with   Leg Swelling    History of Present Illness:    John [name] is a 73 y.o. male with a past medical history significant for diabetes type 2, chronic combined systolic and diastolic heart failure, hypertension, atrial flutter (s/p ablation 2010), PAF, NSVT, CHB s/p PPM 2017, hyperlipidemia, obesity and mild AS.  Patient was seen in the emergency department at [facility] on 10/06/2018 with leg pain.  He was noted to have persistent bilateral lower extremity swelling for the prior 3 weeks.  He had a previous hospitalization 5/26- 08/22/2018 for acute CHF exacerbation.  BNP was elevated at 1285.  He was diuresed well with IV Lasix and transition to torsemide 40 mg twice daily along with metolazone 2.5 mg every other day.  Discharge weight was 262 pounds.  At follow-up on 08/29/2018 it was noted that his weight continued to decline.  Repeat labs showed creatinine trended upwards from 1.3 to at the time of hospital discharge to 2.07, therefore Metolazone was discontinued. Repeat labs on 6/11 showed creatinine was still elevated at 2.00 and it was recommended to reduce Torsemide to 80m daily.  He called the office on 6/24 reporting a weight gain of 3 lbs in 24 hours and it was recommended he take Torsemide 465min AM/2098mn PM. Repeat labs on 6/26 showed stable renal function with creatinine of 1.30 and K+ at 4.4.  He was last seen in the office on 09/24/2018 at which time he reported his usual weight being around 248-249 pounds and was at that time elevated to 252 pounds.  He was also noting worsening edema but denied DOE, orthopnea or PND.  He was noted to have been walking for over 1 mile per day without any anginal symptoms.  He was advised to continue a  low-sodium diet and fluid restriction.  It was also noted that his benazepril had been discontinued due to variable renal function.  The patient is here today with complaints of continued leg swelling. He weighs daily and at home his weight was 252, he says it has been stable for the last 2 weeks. He still has leg swelling, 2-3+ pitting edema up to the knees.  Labs on 7/21 showed K+ 5.1. His potassium was stopped. SCr 1.45.   His torsemide was reduced from 40 mg BID to 40 mg am and 20 mg PM. His diuretic dosing has been up and down depending on SCr on labs. His potassium was initially low then high.   At the highest doses of diuretic his swelling in his arms resolved, but his leg swelling never got any better, not even with IV diuretic at the hospital.   He denies shortness of breath, orthopnea, PND. No chest pain/pressure. No pain in his legs.   He tries to stay below 2000 mg sodium per day. He and his wife read labels. He elevates his feet as much as possible.   He is very frustrated that the swelling is not improving at all.   Past Medical History:  Diagnosis Date   [place]   Admitted with symptomatic bradycardia (HR 40s), atrial flutter with slow ventricular response 12/2010 + volume overload; AV nodal agents d/c'd and Pradaxa started; RFA  in 01/2011   CHF (congestive heart failure) (HCC)    Chronic combined systolic and diastolic heart failure ([place])    echo 01/06/11: mild LVH, EF 65-70%, mod to severe LAE, mild RVE, mild RAE, PASP 32;   TEE 10/12: EF 45-50%    Class 2 severe obesity due to excess calories with serious comorbidity and body mass index (BMI) of 37.0 to 37.9 in adult [facility]) 07/17/2017   Diabetes mellitus    non insulin dependant   Hyperlipidemia    Hypertension    Osteoarthritis    Presence of permanent cardiac pacemaker    PSVT (paroxysmal supraventricular tachycardia) ([place])    Possibly atrial flutter    Past Surgical History:    Procedure Laterality Date   ATRIAL FLUTTER ABLATION N/A 02/07/2011   Procedure: ATRIAL FLUTTER ABLATION;  Surgeon: Evans Lance, MD;  Location: [facility] CATH LAB;  Service: Cardiovascular;  Laterality: N/A;   CARDIAC ELECTROPHYSIOLOGY STUDY AND ABLATION  02/07/11   CARDIOVERSION N/A 03/23/2016   Procedure: CARDIOVERSION;  Surgeon: Evans Lance, MD;  Location: [place];  Service: Cardiovascular;  Laterality: N/A;   COLONOSCOPY N/A 04/17/2017   Procedure: COLONOSCOPY;  Surgeon: Rogene Houston, MD;  [facility];  Service: Endoscopy;  Laterality: N/A;  [place] N/A 08/29/2015   Procedure: Pacemaker Implant;  Surgeon: Evans Lance, MD;  Location: [place] CV LAB;  Service: Cardiovascular;  Laterality: N/A;   EP IMPLANTABLE DEVICE N/A 12/07/2015   Procedure: PPM Lead Revision/Repair;  Surgeon: Evans Lance, MD;  Location: [place] CV LAB;  Service: Cardiovascular;  Laterality: N/A;   KNEE ARTHROSCOPY  2005   left   LUMBAR SPINE SURGERY     "I've had 6 ORs 1972 thru 2004"   POLYPECTOMY  04/17/2017   Procedure: POLYPECTOMY;  Surgeon: Rogene Houston, MD;  [facility]: [facility];  Service: Endoscopy;;  transverse colon x3;    Current Medications: Current Meds  Medication Sig   acetaminophen (TYLENOL) 500 MG tablet Take 1,000 mg by mouth every 6 (six) hours as needed for moderate pain (thigh pain).   amLODipine (NORVASC) 10 MG tablet Take 1 tablet (10 mg total) by mouth daily. One daily   benzonatate (TESSALON) 100 MG capsule TAKE 1 CAPSULE (100 MG TOTAL) BY MOUTH EVERY 6 (SIX) HOURS AS NEEDED FOR COUGH.   carvedilol (COREG) 25 MG tablet TAKE 1 TABLET (25 MG TOTAL) BY MOUTH 2 (TWO) TIMES DAILY WITH A MEAL.   Dulaglutide (TRULICITY) 1.5 NF/6.2ZH SOPN Inject 1.5 mg into the skin once a week.   ELIQUIS 5 MG TABS tablet TAKE 1 TABLET TWICE DAILY (Patient taking differently: Take 5 mg by mouth 2 (two) times daily. )   glucose blood test strip  1 each by Other route as needed. Use as instructed bid   HYDROcodone-acetaminophen (NORCO/VICODIN) 5-325 MG tablet Take one tablet po every 4 hrs prn pain   Insulin Pen Needle 31G X 8 MM MISC USE EVERY DAY AS DIRECTED BY PHYSICIAN   Magnesium 400 MG CAPS Take 400 mg by mouth daily.    metFORMIN (GLUCOPHAGE) 500 MG tablet Take 1 tablet (500 mg total) by mouth 2 (two) times daily with a meal.   Multiple Vitamins-Minerals (MULTIVITAMIN WITH MINERALS) tablet Take 1 tablet by mouth daily.   Resveratrol 100 MG CAPS Take 100 mg by mouth at bedtime.    rosuvastatin (CRESTOR) 20 MG tablet Take 1 tablet (20 mg total) by mouth daily.  torsemide (DEMADEX) 20 MG tablet Take 40 mg by mouth twice daily alternating with 40 mg AM and 20 mg in the PM daily   TRESIBA FLEXTOUCH 200 UNIT/ML SOPN INJECT 70 UNITS SUBCUTANEOUSLY AT BEDTIME   [DISCONTINUED] metolazone (ZAROXOLYN) 2.5 MG tablet Take 1 tablet 2.5 mg today.May repeat tomorrow if you weight has not gone back to your baseline (Patient taking differently: Take 2.5 mg by mouth as needed (fluid). Take 1 tablet 2.5 mg with other fluid pill. May repeat tomorrow if your weight has not gone back to your baseline)   [DISCONTINUED] potassium chloride SA (K-DUR) 20 MEQ tablet Take 2 tablets (40 mEq total) by mouth daily.   [DISCONTINUED] torsemide (DEMADEX) 20 MG tablet Take 2 tablets (40 mg total) by mouth 2 (two) times daily.   [DISCONTINUED] torsemide (DEMADEX) 20 MG tablet Take 40 mg in the AM and Take 20 mg in the PM     Allergies:   Patient has no known allergies.   Social History   Socioeconomic History   Marital status: Married    Spouse name: Not on file   Number of children: Not on file   Years of education: Not on file   Highest education level: Not on file  Occupational History   Occupation: Immunologist: [employer] resource strain: Not on file   Food insecurity    Worry: Not on  file    Inability: Not on file   Transportation needs    Medical: Not on file    Non-medical: Not on file  Tobacco Use   Smoking status: Former Smoker    Packs/day: 1.00    Years: 10.00    Pack years: 10.00    Types: Cigarettes    Quit date: 03/26/1986    Years since quitting: 32.5   Smokeless tobacco: Never Used   Tobacco comment: "stopped cigarette  smoking 1988"  Substance and Sexual Activity   Alcohol use: Never    Alcohol/week: 0.0 standard drinks    Frequency: Never    Comment: "quit alcohol ~ 2007"   Drug use: No   Sexual activity: Yes    Partners: Female  Lifestyle   Physical activity    Days per week: Not on file    Minutes per session: Not on file   Stress: Not on file  Relationships   Social connections    Talks on phone: Not on file    Gets together: Not on file    Attends religious service: Not on file    Active member of club or organization: Not on file    Attends meetings of clubs or organizations: Not on file    Relationship status: Not on file  Other Topics Concern   Not on file  Social History Narrative   Not on file    Family History: The patient's family history includes Heart attack in his brother, father, and mother; Hypertension in his father and mother. There is no history of Colon cancer. ROS:   Please see the history of present illness.     All other systems reviewed and are negative.  EKGs/Labs/Other Studies Reviewed:    The following studies were reviewed today:  Echocardiogram 08/20/2018 IMPRESSIONS   1. The left ventricle has mildly reduced systolic function, with an ejection fraction of 45-50%. The cavity size was mild to moderately dilated. There is mildly increased left ventricular wall thickness. Left ventricular diastolic Doppler parameters are  consistent with  pseudonormalization. Left ventrical global hypokinesis without regional wall motion abnormalities.  2. The right ventricle has normal systolic function. The  cavity was normal. There is no increase in right ventricular wall thickness.  3. Left atrial size was severely dilated.  4. The aortic valve is tricuspid. Mild thickening of the aortic valve. Moderate calcification of the aortic valve. Mild stenosis of the aortic valve.  5. The ascending aorta is normal in size and structure.  6. The inferior vena cava was dilated in size with <50% respiratory variability.   EKG:  EKG is not ordered today.    Recent Labs: 09/12/2018: ALT 23 10/06/2018: B Natriuretic Peptide 1,072.2; Hemoglobin 13.8; Magnesium 2.3; Platelets 132 10/14/2018: BUN 25; Creatinine, Ser 1.45; Potassium 5.1; Sodium 133   Recent Lipid Panel    Component Value Date/Time   CHOL 142 01/17/2018 0835   TRIG 90 01/17/2018 0835   HDL 53 01/17/2018 0835   CHOLHDL 2.7 01/17/2018 0835   CHOLHDL 3.2 11/16/2013 0702   VLDL 45 (H) 11/16/2013 0702   LDLCALC 71 01/17/2018 0835    Physical Exam:    VS:  BP 104/63    Pulse 81    Temp 98.5 F (36.9 C)    Ht 5' 11"  (1.803 m)    Wt 268 lb 3.2 oz (121.7 kg)    SpO2 99%    BMI 37.41 kg/m     Wt Readings from Last 3 Encounters:  10/17/18 268 lb 3.2 oz (121.7 kg)  10/09/18 247 lb 6.4 oz (112.2 kg)  10/06/18 249 lb (112.9 kg)     Physical Exam  Constitutional: He is oriented to person, place, and time.  Neck: JVD present.  Cardiovascular: Normal rate and regular rhythm.  Murmur heard.  Harsh midsystolic murmur is present with a grade of 2/6 at the upper right sternal border radiating to the neck. Pulmonary/Chest:  Few rales in posterior bases  Abdominal: Soft. Bowel sounds are normal.  Musculoskeletal: Normal range of motion.        General: Edema present.     Comments: 2-3+ pitting edema up to knees.   Neurological: He is alert and oriented to person, place, and time.  Skin: Skin is warm and dry.  Psychiatric: He has a normal mood and affect. His behavior is normal. Judgment and thought content normal.  Vitals  reviewed.    ASSESSMENT:    1. Chronic combined systolic and diastolic heart failure (HCC)   2. Cardiac pacemaker in situ   3. Permanent atrial fibrillation   4. Essential (primary) hypertension   5. Hyperlipidemia, unspecified hyperlipidemia type   6. Aortic valve stenosis, etiology of cardiac valve disease unspecified    PLAN:    In order of problems listed above:  Chronic combined systolic and diastolic heart failure -He has known mildly reduced EF of 45-50%. -The patient has been having trouble with volume management.  He was switched to torsemide.  His renal function has been variable causing the need to adjust his dosing. -He says that his leg swelling never went down even when he was hospitalized for IV diuresis. -His torsemide dosing has been increased to combat swelling and then decreased due to worsening renal function. -Patient has been taking torsemide 40 mg in the morning and 20 in the evening with no improvement in swelling.  I am reluctant to increase it too much due to his kidney function which worsened when he was on 40 mg twice daily.  I will have him take 40  mg in the morning and 20 mg in the afternoon with 40 mg twice daily on alternating days. -Check metabolic panel early next week. -I will have him follow-up soon in 2-3 weeks, hopefully with Dr. Bronson Ing. With diastolic heart failure, not responding to diuretics, he may need further workup for possible infiltrative disease although there was mild increase in LV wall thickness but no increase in RV wall thickness.   History of complete heart block s/p PPM in 2017 -Followed by Dr. Lovena Le  Permanent atrial fibrillation -Rate controlled with beta-blocker. Heart rhythm very regular on exam.  -Eliquis for stroke risk reduction  Hypertension -On amlodipine 10 mg and carvedilol 25 mg twice daily. BP well controlled.   Hyperlipidemia - followed by PCP. LDL at 71 when checked in 2019. Remains on Crestor 15m daily.    Aortic Stenosis - mild by echocardiogram in 07/2018. Continue to follow.     Medication Adjustments/Labs and Tests Ordered: Current medicines are reviewed at length with the patient today.  Concerns regarding medicines are outlined above. Labs and tests ordered and medication changes are outlined in the patient instructions below:  Patient Instructions  Medication Instructions:   Your physician has recommended you make the following change in your medication:   Change torsemide to 40 mg by mouth twice daily alternating with 40 mg in the morning and 20 mg as second dose daily  Continue all other medications the same  Labwork:  Your physician recommends that you return for lab work in: next Tuesday, October 21, 2018 to check your BMET. Please take your lab order with you when you have this done.   Testing/Procedures:  NONE  Follow-Up:  Your physician recommends that you schedule a follow-up appointment in: 2-3 weeks.  Any Other Special Instructions Will Be Listed Below (If Applicable).  Continue daily weights  Contact our office for weight gain of 3 lbs in 24 hours or 5 lbs in one week  Contact our office for increase swelling, shortness of breath or unable to lie flat at night.  If you need a refill on your cardiac medications before your next appointment, please call your pharmacy.      Signed, JDaune Perch NP  10/17/2018 5:42 PM    [facility]

## 2018-10-17 NOTE — Patient Instructions (Addendum)
Medication Instructions:   Your physician has recommended you make the following change in your medication:   Change torsemide to 40 mg by mouth twice daily alternating with 40 mg in the morning and 20 mg as second dose daily  Continue all other medications the same  Labwork:  Your physician recommends that you return for lab work in: next Tuesday, October 21, 2018 to check your BMET. Please take your lab order with you when you have this done.   Testing/Procedures:  NONE  Follow-Up:  Your physician recommends that you schedule a follow-up appointment in: 2-3 weeks.  Any Other Special Instructions Will Be Listed Below (If Applicable).  Continue daily weights  Contact our office for weight gain of 3 lbs in 24 hours or 5 lbs in one week  Contact our office for increase swelling, shortness of breath or unable to lie flat at night.  If you need a refill on your cardiac medications before your next appointment, please call your pharmacy.

## 2018-10-18 ENCOUNTER — Telehealth: Payer: Self-pay | Admitting: Physician Assistant

## 2018-10-18 NOTE — Telephone Encounter (Signed)
Received call from patient that he gained 4 lbs overnight. He denies SOB, but is having increased lower extremity edema. He saw Pecolia Ades in clinic yesterday who attempted to decrease his torsemide to 40 mg BID on alternating days with 40 qAM and 20 qPM. Yesterday was the first day he took only 20 mg in the evening. I suggested that he may need to stay on the 40 mg BID and may need to nephrology.   He also states he has not seen a physician and would prefer to see a physician ASAP. I will message scheduling.  Ledora Bottcher, PA-C 10/18/2018, 2:20 PM [address] [place], [place] [id number]

## 2018-10-21 ENCOUNTER — Other Ambulatory Visit (HOSPITAL_COMMUNITY)
Admission: RE | Admit: 2018-10-21 | Discharge: 2018-10-21 | Disposition: A | Discharge status: Home / Self Care | Payer: Medicare HMO | Source: Ambulatory Visit | Attending: Cardiology | Admitting: Cardiology

## 2018-10-21 DIAGNOSIS — I5042 Chronic combined systolic (congestive) and diastolic (congestive) heart failure: Secondary | ICD-10-CM | POA: Diagnosis not present

## 2018-10-21 LAB — BASIC METABOLIC PANEL
Anion gap: 11 (ref 5–15)
BUN: 23 mg/dL (ref 8–23)
CO2: 24 mmol/L (ref 22–32)
Calcium: 8.7 mg/dL — ABNORMAL LOW (ref 8.9–10.3)
Chloride: 101 mmol/L (ref 98–111)
Creatinine, Ser: 1.4 mg/dL — ABNORMAL HIGH (ref 0.61–1.24)
GFR calc Af Amer: 58 mL/min — ABNORMAL LOW (ref >60)
GFR calc non Af Amer: 50 mL/min — ABNORMAL LOW (ref >60)
Glucose, Bld: 92 mg/dL (ref 70–99)
Potassium: 3.8 mmol/L (ref 3.5–5.1)
Sodium: 136 mmol/L (ref 135–145)

## 2018-10-22 ENCOUNTER — Other Ambulatory Visit: Payer: Self-pay

## 2018-10-22 ENCOUNTER — Ambulatory Visit (INDEPENDENT_AMBULATORY_CARE_PROVIDER_SITE_OTHER): Payer: Medicare HMO | Admitting: Cardiovascular Disease

## 2018-10-22 ENCOUNTER — Encounter: Payer: Self-pay | Admitting: Cardiovascular Disease

## 2018-10-22 ENCOUNTER — Telehealth: Payer: Self-pay | Admitting: *Deleted

## 2018-10-22 VITALS — BP 122/68 | HR 90 | Temp 98.6°F | Ht 71.0 in | Wt 273.0 lb

## 2018-10-22 DIAGNOSIS — N183 Chronic kidney disease, stage 3 unspecified: Secondary | ICD-10-CM

## 2018-10-22 DIAGNOSIS — I5043 Acute on chronic combined systolic (congestive) and diastolic (congestive) heart failure: Secondary | ICD-10-CM

## 2018-10-22 DIAGNOSIS — I4821 Permanent atrial fibrillation: Secondary | ICD-10-CM | POA: Diagnosis not present

## 2018-10-22 DIAGNOSIS — Z950 Presence of cardiac pacemaker: Secondary | ICD-10-CM | POA: Diagnosis not present

## 2018-10-22 DIAGNOSIS — I10 Essential (primary) hypertension: Secondary | ICD-10-CM | POA: Diagnosis not present

## 2018-10-22 DIAGNOSIS — I350 Nonrheumatic aortic (valve) stenosis: Secondary | ICD-10-CM | POA: Diagnosis not present

## 2018-10-22 DIAGNOSIS — E785 Hyperlipidemia, unspecified: Secondary | ICD-10-CM | POA: Diagnosis not present

## 2018-10-22 MED ORDER — TORSEMIDE 20 MG PO TABS: 60.0000 mg | ORAL_TABLET | Freq: Two times a day (BID) | ORAL | 1 refills | Status: DC

## 2018-10-22 MED ORDER — METOLAZONE 2.5 MG PO TABS: ORAL_TABLET | ORAL | 1 refills | Status: DC

## 2018-10-22 NOTE — Progress Notes (Signed)
SUBJECTIVE: John [name] is a 73 y.o. male with a history of high-grade heart block status post permanent pacemaker insertion, atrial flutter status post ablation, andparoxysmal atrial fibrillation status post cardioversionon 03/23/16. He also has chronic systolic heart failureand aortic stenosis.  I last saw him on 07/22/2018 and he has been seen by other providers since that time, most recently last week.  He was hospitalized in late May 2020 for an acute CHF exacerbation.  At that time he was transitioned to torsemide 40 mg twice daily along with metolazone 2.5 mg every other day.  When he was seen in the office on 10/17/2018, torsemide was switched to 40 mg twice daily and 40 mg / 20 mg on alternate days.  This was done due to renal dysfunction.  However, he called our office and said he gained 4 pounds overnight so it was promptly increased back to 40 mg twice daily.  He had been walking 1 mile a day but now can only walk halfway on his driveway due to exertional dyspnea.  He denies chest pain.  He weighed 265 pounds at home without clothes today.  He normally weighs between 245 and 250 pounds without clothes at home.  He eats a lot of vegetables from his garden.  He does not eat out at restaurants.  His wife rinses any canned foods they purchase.  We talked about both outpatient and inpatient management for his CHF today.   Review of Systems: As per "subjective", otherwise negative.  No Known Allergies  Current Outpatient Medications  Medication Sig Dispense Refill  . acetaminophen (TYLENOL) 500 MG tablet Take 1,000 mg by mouth every 6 (six) hours as needed for moderate pain (thigh pain).    Marland Kitchen amLODipine (NORVASC) 10 MG tablet Take 1 tablet (10 mg total) by mouth daily. One daily 90 tablet 1  . benzonatate (TESSALON) 100 MG capsule TAKE 1 CAPSULE (100 MG TOTAL) BY MOUTH EVERY 6 (SIX) HOURS AS NEEDED FOR COUGH. 30 capsule 2  . carvedilol (COREG) 25 MG tablet TAKE 1  TABLET (25 MG TOTAL) BY MOUTH 2 (TWO) TIMES DAILY WITH A MEAL. 180 tablet 3  . Dulaglutide (TRULICITY) 1.5 MW/1.0UV SOPN Inject 1.5 mg into the skin once a week. 12 pen 0  . ELIQUIS 5 MG TABS tablet TAKE 1 TABLET TWICE DAILY (Patient taking differently: Take 5 mg by mouth 2 (two) times daily. ) 180 tablet 3  . glucose blood test strip 1 each by Other route as needed. Use as instructed bid 100 each 3  . HYDROcodone-acetaminophen (NORCO/VICODIN) 5-325 MG tablet Take one tablet po every 4 hrs prn pain 28 tablet 0  . Insulin Pen Needle 31G X 8 MM MISC USE EVERY DAY AS DIRECTED BY PHYSICIAN 100 each 4  . Magnesium 400 MG CAPS Take 400 mg by mouth daily.     . metFORMIN (GLUCOPHAGE) 500 MG tablet Take 1 tablet (500 mg total) by mouth 2 (two) times daily with a meal. 180 tablet 1  . Multiple Vitamins-Minerals (MULTIVITAMIN WITH MINERALS) tablet Take 1 tablet by mouth daily.    Marland Kitchen Resveratrol 100 MG CAPS Take 100 mg by mouth at bedtime.     . rosuvastatin (CRESTOR) 20 MG tablet Take 1 tablet (20 mg total) by mouth daily. 90 tablet 1  . torsemide (DEMADEX) 20 MG tablet Take 40 mg by mouth twice daily alternating with 40 mg AM and 20 mg in the PM daily 135 tablet 3  .  TRESIBA FLEXTOUCH 200 UNIT/ML SOPN INJECT 70 UNITS SUBCUTANEOUSLY AT BEDTIME 36 mL 0   No current facility-administered medications for this visit.     Past Medical History:  Diagnosis Date  . Atrial flutter ([place]) 12/2010   Admitted with symptomatic bradycardia (HR 40s), atrial flutter with slow ventricular response 12/2010 + volume overload; AV nodal agents d/c'd and Pradaxa started; RFA in 01/2011  . CHF (congestive heart failure) ([place])   . Chronic combined systolic and diastolic heart failure ([facility])    echo 01/06/11: mild LVH, EF 65-70%, mod to severe LAE, mild RVE, mild RAE, PASP 32;   TEE 10/12: EF 45-50%   . Class 2 severe obesity due to excess calories with serious comorbidity and body mass index (BMI) of 37.0 to 37.9 in adult ([place])  07/17/2017  . Diabetes mellitus    non insulin dependant  . Hyperlipidemia   . Hypertension   . Osteoarthritis   . Presence of permanent cardiac pacemaker   . PSVT (paroxysmal supraventricular tachycardia) (H[facility])    Possibly atrial flutter    Past Surgical History:  Procedure Laterality Date  . ATRIAL FLUTTER ABLATION N/A 02/07/2011   Procedure: ATRIAL FLUTTER ABLATION;  Surgeon: Evans Lance, MD;  Location: [facility] CATH LAB;  Service: Cardiovascular;  Laterality: N/A;  . CARDIAC ELECTROPHYSIOLOGY STUDY AND ABLATION  02/07/11  . CARDIOVERSION N/A 03/23/2016   Procedure: CARDIOVERSION;  Surgeon: Evans Lance, MD;  Location: [facility];  Service: Cardiovascular;  Laterality: N/A;  . COLONOSCOPY N/A 04/17/2017   Procedure: COLONOSCOPY;  Surgeon: Rogene Houston, MD;  [facility];  Service: Endoscopy;  Laterality: N/A;  830  . EP IMPLANTABLE DEVICE N/A 08/29/2015   Procedure: Pacemaker Implant;  Surgeon: Evans Lance, MD;  Location: [facility] CV LAB;  Service: Cardiovascular;  Laterality: N/A;  . EP IMPLANTABLE DEVICE N/A 12/07/2015   Procedure: PPM Lead Revision/Repair;  Surgeon: Evans Lance, MD;  Location: [place] CV LAB;  Service: Cardiovascular;  Laterality: N/A;  . KNEE ARTHROSCOPY  2005   left  . LUMBAR SPINE SURGERY     "I've had 6 ORs 1972 thru 2004"  . POLYPECTOMY  04/17/2017   Procedure: POLYPECTOMY;  Surgeon: Rogene Houston, MD;  Location: [facility];  Service: Endoscopy;;  transverse colon x3;    Social History   Socioeconomic History  . Marital status: Married    Spouse name: Not on file  . Number of children: Not on file  . Years of education: Not on file  . Highest education level: Not on file  Occupational History  . Occupation: Immunologist: RETIRED  Social Needs  . Financial resource strain: Not on file  . Food insecurity    Worry: Not on file    Inability: Not on file  . Transportation needs    Medical: Not on  file    Non-medical: Not on file  Tobacco Use  . Smoking status: Former Smoker    Packs/day: 1.00    Years: 10.00    Pack years: 10.00    Types: Cigarettes    Quit date: 03/26/1986    Years since quitting: 32.5  . Smokeless tobacco: Never Used  . Tobacco comment: "stopped cigarette  smoking 1988"  Substance and Sexual Activity  . Alcohol use: Never    Alcohol/week: 0.0 standard drinks    Frequency: Never    Comment: "quit alcohol ~ 2007"  . Drug use: No  . Sexual activity:  Yes    Partners: Female  Lifestyle  . Physical activity    Days per week: Not on file    Minutes per session: Not on file  . Stress: Not on file  Relationships  . Social Herbalist on phone: Not on file    Gets together: Not on file    Attends religious service: Not on file    Active member of club or organization: Not on file    Attends meetings of clubs or organizations: Not on file    Relationship status: Not on file  . Intimate partner violence    Fear of current or ex partner: Not on file    Emotionally abused: Not on file    Physically abused: Not on file    Forced sexual activity: Not on file  Other Topics Concern  . Not on file  Social History Narrative  . Not on file     Vitals:   10/22/18 0951  BP: 122/68  Pulse: 90  Temp: 98.6 F (37 C)  SpO2: 98%  Weight: 273 lb (123.8 kg)  Height: 5' 11"  (1.803 m)    Wt Readings from Last 3 Encounters:  10/22/18 273 lb (123.8 kg)  10/17/18 268 lb 3.2 oz (121.7 kg)  10/09/18 247 lb 6.4 oz (112.2 kg)     PHYSICAL EXAM General: NAD HEENT: Normal. Neck: No JVD, no thyromegaly. Lungs: Clear to auscultation bilaterally with normal respiratory effort. CV: Regular rate and rhythm, normal S1/S2, no S3/S4, no murmur.  2-3+ pitting bilateral lower extremity edema up to the knees.   Abdomen: Soft, nontender, no distention.  Neurologic: Alert and oriented.  Psych: Normal affect. Skin: Normal. Musculoskeletal: No gross deformities.     ECG: Reviewed above under Subjective   Labs: Lab Results  Component Value Date/Time   K 3.8 10/21/2018 09:43 AM   BUN 23 10/21/2018 09:43 AM   BUN 26 09/12/2018 08:57 AM   CREATININE 1.40 (H) 10/21/2018 09:43 AM   CREATININE 1.32 (H) 03/07/2016 10:43 AM   ALT 23 09/12/2018 08:57 AM   TSH 1.940 07/10/2017 08:04 AM   HGB 13.8 10/06/2018 01:49 PM     Lipids: Lab Results  Component Value Date/Time   LDLCALC 71 01/17/2018 08:35 AM   CHOL 142 01/17/2018 08:35 AM   TRIG 90 01/17/2018 08:35 AM   HDL 53 01/17/2018 08:35 AM      Echocardiogram: 08/20/2018 IMPRESSIONS  1. The left ventricle has mildly reduced systolic function, with an ejection fraction of 45-50%. The cavity size was mild to moderately dilated. There is mildly increased left ventricular wall thickness. Left ventricular diastolic Doppler parameters are consistent with pseudonormalization. Left ventrical global hypokinesis without regional wall motion abnormalities. 2. The right ventricle has normal systolic function. The cavity was normal. There is no increase in right ventricular wall thickness. 3. Left atrial size was severely dilated. 4. The aortic valve is tricuspid. Mild thickening of the aortic valve. Moderate calcification of the aortic valve. Mild stenosis of the aortic valve. 5. The ascending aorta is normal in size and structure. 6. The inferior vena cava was dilated in size with <50% respiratory variability.    ASSESSMENT AND PLAN: 1. Permanent atrial fibrillation: Continue carvedilol and Eliquis. Symptomaticallystable s/p DCCV in 02/2016.  2. Acute on chronic combined heart failure: LVEF 45-50%.   Weight is up 5 pounds from 5 days ago.  When he was seen in the office on 10/17/2018, torsemide was switched to 40 mg twice  daily and 40 mg / 20 mg on alternate days.  This was done due to renal dysfunction. However, he called our office and said he gained 4 pounds overnight so it was promptly increased  back to 40 mg twice daily. We discussed the possibility of hospitalization for IV diuresis.  For the time being, I will try to manage him as an outpatient. I will increase torsemide to 60 mg twice daily.  He has not taken any diuretics thus far today.  I will prescribe metolazone 5 mg daily for the next 3 days followed by 2.5 mg every other day.  I will check a basic metabolic panel in 2 days and check another 1 this upcoming Monday. I will have him return for a nurse visit next week to assess his weight and lower extremity edema.  If he has problems with diuresis and/or progressive renal dysfunction, he will need to be hospitalized.  He is in agreement with this plan.  3. Pacemaker s/p RA/RV lead revision 11/2015: Stable. Normal device function. Continue follow up with EP.  4. Hypertension: Blood pressure is normal.  We will continue to monitor in light of diuretic adjustments.  5. Hyperlipidemia: Continue statin.  6. Aortic stenosis: Mild in 07/2018. Stable.  7.  CKD stage III: Creatinine 1.4 on 10/21/2018.  We will continue to monitor light of diuretic adjustments.  I will check a basic metabolic panel in 2 days and then again this upcoming Monday.    Disposition: Follow up next week for a nurse visit.  Follow-up in 1 month with me or APP if I am unavailable.  A high level of decision making was required for increased medical complexities.    [name], M.D., F.A.C.C.

## 2018-10-22 NOTE — Telephone Encounter (Signed)
-----   Message from Daune Perch, NP sent at 10/21/2018 10:54 AM EDT ----- I saw pt last week. He still had volume overload. I increased his torsemide from 40 mg am 20 mg pm to 40 mg am 20 mg pm alternating with 40 mg BID. His labs are stable. He can continue this if it is helping. Potassium is low normal.   Daune Perch, NP

## 2018-10-22 NOTE — Patient Instructions (Signed)
Your physician recommends that you schedule a follow-up appointment in: [place]  Your physician has recommended you make the following change in your medication:   INCREASE TORSEMIDE 60 MG (3 TABLETS) TWICE DAILY  START METOLAZONE 5 MG (2 TABLETS) DAILY FOR 3 DAYS THEN TAKE 2.5 MG (1 TABLET) EVERY OTHER DAY   Your physician recommends that you return for lab work Friday BMP AND Monday BMP - WE HAVE GIVEN YOU ORDERS  Thank you for choosing [facility]!!

## 2018-10-22 NOTE — Telephone Encounter (Signed)
Patient informed and says he got new instructions from Dr. Raliegh Ip today that he will start. Copy sent to PCP

## 2018-10-23 ENCOUNTER — Ambulatory Visit: Payer: Medicare HMO | Admitting: Cardiovascular Disease

## 2018-10-24 ENCOUNTER — Telehealth: Payer: Self-pay | Admitting: *Deleted

## 2018-10-24 ENCOUNTER — Other Ambulatory Visit: Payer: Self-pay

## 2018-10-24 ENCOUNTER — Other Ambulatory Visit (HOSPITAL_COMMUNITY)
Admission: RE | Admit: 2018-10-24 | Discharge: 2018-10-24 | Disposition: A | Discharge status: Home / Self Care | Payer: Medicare HMO | Source: Ambulatory Visit | Attending: Cardiovascular Disease | Admitting: Cardiovascular Disease

## 2018-10-24 DIAGNOSIS — I5042 Chronic combined systolic (congestive) and diastolic (congestive) heart failure: Secondary | ICD-10-CM | POA: Insufficient documentation

## 2018-10-24 DIAGNOSIS — Z79899 Other long term (current) drug therapy: Secondary | ICD-10-CM

## 2018-10-24 LAB — BASIC METABOLIC PANEL
Anion gap: 12 (ref 5–15)
BUN: 30 mg/dL — ABNORMAL HIGH (ref 8–23)
CO2: 29 mmol/L (ref 22–32)
Calcium: 9 mg/dL (ref 8.9–10.3)
Chloride: 93 mmol/L — ABNORMAL LOW (ref 98–111)
Creatinine, Ser: 1.51 mg/dL — ABNORMAL HIGH (ref 0.61–1.24)
GFR calc Af Amer: 53 mL/min — ABNORMAL LOW (ref >60)
GFR calc non Af Amer: 45 mL/min — ABNORMAL LOW (ref >60)
Glucose, Bld: 117 mg/dL — ABNORMAL HIGH (ref 70–99)
Potassium: 2.5 mmol/L — CL (ref 3.5–5.1)
Sodium: 134 mmol/L — ABNORMAL LOW (ref 135–145)

## 2018-10-24 MED ORDER — POTASSIUM CHLORIDE CRYS ER 20 MEQ PO TBCR: EXTENDED_RELEASE_TABLET | ORAL | 11 refills | Status: DC

## 2018-10-24 NOTE — Telephone Encounter (Signed)
Critical Potassium of 2.5 called from [place] in the lab

## 2018-10-24 NOTE — Telephone Encounter (Signed)
80 meq KCl now and tomorrow followed by 40 meq daily. Repeat BMET tomorrow and Sunday.

## 2018-10-24 NOTE — Telephone Encounter (Signed)
Pt notified and orders placed

## 2018-10-25 ENCOUNTER — Other Ambulatory Visit: Payer: Self-pay | Admitting: Cardiology

## 2018-10-25 ENCOUNTER — Other Ambulatory Visit (HOSPITAL_COMMUNITY)
Admission: RE | Admit: 2018-10-25 | Discharge: 2018-10-25 | Disposition: A | Discharge status: Home / Self Care | Payer: Medicare HMO | Source: Ambulatory Visit | Attending: Cardiovascular Disease | Admitting: Cardiovascular Disease

## 2018-10-25 DIAGNOSIS — Z79899 Other long term (current) drug therapy: Secondary | ICD-10-CM | POA: Insufficient documentation

## 2018-10-25 LAB — BASIC METABOLIC PANEL
Anion gap: 13 (ref 5–15)
BUN: 31 mg/dL — ABNORMAL HIGH (ref 8–23)
CO2: 30 mmol/L (ref 22–32)
Calcium: 9.2 mg/dL (ref 8.9–10.3)
Chloride: 93 mmol/L — ABNORMAL LOW (ref 98–111)
Creatinine, Ser: 1.48 mg/dL — ABNORMAL HIGH (ref 0.61–1.24)
GFR calc Af Amer: 54 mL/min — ABNORMAL LOW (ref >60)
GFR calc non Af Amer: 47 mL/min — ABNORMAL LOW (ref >60)
Glucose, Bld: 85 mg/dL (ref 70–99)
Potassium: 3.2 mmol/L — ABNORMAL LOW (ref 3.5–5.1)
Sodium: 136 mmol/L (ref 135–145)

## 2018-10-25 MED ORDER — TORSEMIDE 20 MG PO TABS: 60.0000 mg | ORAL_TABLET | Freq: Two times a day (BID) | ORAL | 1 refills | Status: DC

## 2018-10-26 ENCOUNTER — Other Ambulatory Visit (HOSPITAL_COMMUNITY)
Admission: RE | Admit: 2018-10-26 | Discharge: 2018-10-26 | Disposition: A | Discharge status: Home / Self Care | Payer: Medicare HMO | Source: Ambulatory Visit | Attending: Cardiovascular Disease | Admitting: Cardiovascular Disease

## 2018-10-26 ENCOUNTER — Other Ambulatory Visit: Payer: Self-pay | Admitting: Cardiology

## 2018-10-26 ENCOUNTER — Telehealth: Payer: Self-pay | Admitting: Cardiology

## 2018-10-26 DIAGNOSIS — Z79899 Other long term (current) drug therapy: Secondary | ICD-10-CM | POA: Diagnosis not present

## 2018-10-26 LAB — BASIC METABOLIC PANEL
Anion gap: 13 (ref 5–15)
BUN: 30 mg/dL — ABNORMAL HIGH (ref 8–23)
CO2: 30 mmol/L (ref 22–32)
Calcium: 9.2 mg/dL (ref 8.9–10.3)
Chloride: 92 mmol/L — ABNORMAL LOW (ref 98–111)
Creatinine, Ser: 1.36 mg/dL — ABNORMAL HIGH (ref 0.61–1.24)
GFR calc Af Amer: 60 mL/min — ABNORMAL LOW (ref >60)
GFR calc non Af Amer: 52 mL/min — ABNORMAL LOW (ref >60)
Glucose, Bld: 94 mg/dL (ref 70–99)
Potassium: 2.6 mmol/L — CL (ref 3.5–5.1)
Sodium: 135 mmol/L (ref 135–145)

## 2018-10-26 NOTE — Telephone Encounter (Signed)
Received lab call of K+2.6 that was drawn today. Review of notes showed K+ 2.5>>.3.1. Was instructed to take 79mq and then back 469m daily. Discussed with his PCP cardiologist. Will have him take 8066mnow and again this evening. Then 75m73mgain tomorrow with a recheck BMET tomorrow. He voiced understanding and thanked me for follow up call.

## 2018-10-27 ENCOUNTER — Other Ambulatory Visit (HOSPITAL_COMMUNITY)
Admission: RE | Admit: 2018-10-27 | Discharge: 2018-10-27 | Disposition: A | Discharge status: Home / Self Care | Payer: Medicare HMO | Source: Ambulatory Visit | Attending: Cardiovascular Disease | Admitting: Cardiovascular Disease

## 2018-10-27 ENCOUNTER — Telehealth: Payer: Self-pay | Admitting: Physician Assistant

## 2018-10-27 ENCOUNTER — Telehealth: Payer: Self-pay | Admitting: *Deleted

## 2018-10-27 ENCOUNTER — Other Ambulatory Visit: Payer: Self-pay

## 2018-10-27 DIAGNOSIS — I5042 Chronic combined systolic (congestive) and diastolic (congestive) heart failure: Secondary | ICD-10-CM | POA: Insufficient documentation

## 2018-10-27 DIAGNOSIS — Z79899 Other long term (current) drug therapy: Secondary | ICD-10-CM

## 2018-10-27 LAB — BASIC METABOLIC PANEL
Anion gap: 12 (ref 5–15)
BUN: 29 mg/dL — ABNORMAL HIGH (ref 8–23)
CO2: 27 mmol/L (ref 22–32)
Calcium: 9.1 mg/dL (ref 8.9–10.3)
Chloride: 94 mmol/L — ABNORMAL LOW (ref 98–111)
Creatinine, Ser: 1.31 mg/dL — ABNORMAL HIGH (ref 0.61–1.24)
GFR calc Af Amer: >60 mL/min (ref >60)
GFR calc non Af Amer: 54 mL/min — ABNORMAL LOW (ref >60)
Glucose, Bld: 193 mg/dL — ABNORMAL HIGH (ref 70–99)
Potassium: 3.9 mmol/L (ref 3.5–5.1)
Sodium: 133 mmol/L — ABNORMAL LOW (ref 135–145)

## 2018-10-27 MED ORDER — METOLAZONE 2.5 MG PO TABS: ORAL_TABLET | ORAL | 3 refills | Status: DC

## 2018-10-27 NOTE — Telephone Encounter (Signed)
Can refill.

## 2018-10-27 NOTE — Telephone Encounter (Signed)
-----   Message from Herminio Commons, MD sent at 10/27/2018  9:48 AM EDT ----- K is normal. Have him take 40 meq daily going forward. Repeat BMET in 3 days.

## 2018-10-27 NOTE — Telephone Encounter (Signed)
Pt notified and voiced understanding 

## 2018-10-27 NOTE — Addendum Note (Signed)
Addended by: Levonne Hubert on: 10/27/2018 11:40 AM   Modules accepted: Orders

## 2018-10-27 NOTE — Telephone Encounter (Signed)
Received cc'd following message via secure email from [facility]:  Cheron Schaumann,   I received a note from [facility] this weekend to refill John [name]' (December 07, 1945) metolazone. He is not one of my primary care patients, so I did not want to prescribe anything. It might be good for you all to reach out to him and make sure he is not needing any additional treatment.   Thanks,  Luci Bank to [employer] for unclear reasons, has not been following patient recently. Will route to pt's primary cardiologist/[place] triage to handle. Tyrone Pautsch PA-C

## 2018-10-29 ENCOUNTER — Ambulatory Visit (INDEPENDENT_AMBULATORY_CARE_PROVIDER_SITE_OTHER): Payer: Medicare HMO | Admitting: *Deleted

## 2018-10-29 ENCOUNTER — Other Ambulatory Visit: Payer: Self-pay

## 2018-10-29 VITALS — BP 102/69 | HR 83 | Ht 71.0 in | Wt 263.8 lb

## 2018-10-29 DIAGNOSIS — I10 Essential (primary) hypertension: Secondary | ICD-10-CM | POA: Diagnosis not present

## 2018-10-29 DIAGNOSIS — I5043 Acute on chronic combined systolic (congestive) and diastolic (congestive) heart failure: Secondary | ICD-10-CM

## 2018-10-29 NOTE — Patient Instructions (Signed)
Continue same plan and follow up. We will contact you if your provider makes any changes.

## 2018-10-29 NOTE — Progress Notes (Signed)
Presents to office to have vitals checked. Medications reconciled. Reports being out of torsemide for 4 days but restarted yesterday. Reports taking all doses of medications without side effects. Denies dizziness chest pain or sob.

## 2018-10-30 ENCOUNTER — Other Ambulatory Visit (HOSPITAL_COMMUNITY)
Admission: RE | Admit: 2018-10-30 | Discharge: 2018-10-30 | Disposition: A | Discharge status: Home / Self Care | Payer: Medicare HMO | Source: Ambulatory Visit | Attending: Cardiovascular Disease | Admitting: Cardiovascular Disease

## 2018-10-30 ENCOUNTER — Other Ambulatory Visit: Payer: Self-pay

## 2018-10-30 ENCOUNTER — Telehealth: Payer: Self-pay

## 2018-10-30 DIAGNOSIS — Z79899 Other long term (current) drug therapy: Secondary | ICD-10-CM | POA: Insufficient documentation

## 2018-10-30 DIAGNOSIS — E876 Hypokalemia: Secondary | ICD-10-CM

## 2018-10-30 LAB — BASIC METABOLIC PANEL
Anion gap: 12 (ref 5–15)
BUN: 40 mg/dL — ABNORMAL HIGH (ref 8–23)
CO2: 29 mmol/L (ref 22–32)
Calcium: 8.9 mg/dL (ref 8.9–10.3)
Chloride: 93 mmol/L — ABNORMAL LOW (ref 98–111)
Creatinine, Ser: 1.64 mg/dL — ABNORMAL HIGH (ref 0.61–1.24)
GFR calc Af Amer: 48 mL/min — ABNORMAL LOW (ref >60)
GFR calc non Af Amer: 41 mL/min — ABNORMAL LOW (ref >60)
Glucose, Bld: 69 mg/dL — ABNORMAL LOW (ref 70–99)
Potassium: 2.7 mmol/L — CL (ref 3.5–5.1)
Sodium: 134 mmol/L — ABNORMAL LOW (ref 135–145)

## 2018-10-30 MED ORDER — POTASSIUM CHLORIDE CRYS ER 20 MEQ PO TBCR: EXTENDED_RELEASE_TABLET | ORAL | 11 refills | Status: DC

## 2018-10-30 NOTE — Progress Notes (Signed)
Remote pacemaker transmission.   

## 2018-10-30 NOTE — Telephone Encounter (Signed)
Pt will now take KCL 80 meq daily and repeat bmet tomorrow 8/7 and on Monday 8/10   He will also stop metolazone   I will fax la slips to [facility] lab

## 2018-10-30 NOTE — Telephone Encounter (Signed)
-----   Message from Herminio Commons, MD sent at 10/30/2018 10:24 AM EDT ----- Weight is down 10 lbs as per nurse visit yesterday. Please have him stop metolazone and continue torsemide. K is low. Has he been taking? If so, how much? He'll need to take 80 meq KCl daily with repeat BMET tomorrow and Monday.

## 2018-10-30 NOTE — Telephone Encounter (Signed)
Home number not working, lmtcb on cell-cc

## 2018-10-31 ENCOUNTER — Telehealth: Payer: Self-pay

## 2018-10-31 ENCOUNTER — Other Ambulatory Visit (HOSPITAL_COMMUNITY)
Admission: RE | Admit: 2018-10-31 | Discharge: 2018-10-31 | Disposition: A | Discharge status: Home / Self Care | Payer: Medicare HMO | Source: Ambulatory Visit | Attending: Cardiovascular Disease | Admitting: Cardiovascular Disease

## 2018-10-31 DIAGNOSIS — E876 Hypokalemia: Secondary | ICD-10-CM | POA: Diagnosis not present

## 2018-10-31 LAB — BASIC METABOLIC PANEL
Anion gap: 12 (ref 5–15)
BUN: 42 mg/dL — ABNORMAL HIGH (ref 8–23)
CO2: 31 mmol/L (ref 22–32)
Calcium: 9.4 mg/dL (ref 8.9–10.3)
Chloride: 93 mmol/L — ABNORMAL LOW (ref 98–111)
Creatinine, Ser: 1.51 mg/dL — ABNORMAL HIGH (ref 0.61–1.24)
GFR calc Af Amer: 53 mL/min — ABNORMAL LOW (ref >60)
GFR calc non Af Amer: 45 mL/min — ABNORMAL LOW (ref >60)
Glucose, Bld: 146 mg/dL — ABNORMAL HIGH (ref 70–99)
Potassium: 3.1 mmol/L — ABNORMAL LOW (ref 3.5–5.1)
Sodium: 136 mmol/L (ref 135–145)

## 2018-10-31 NOTE — Telephone Encounter (Signed)
-----   Message from Herminio Commons, MD sent at 10/31/2018  2:28 PM EDT ----- Potassium still low but it has gradually improved.  Have him take an extra 40 mEq today.  He is due for a follow-up basic metabolic panel this upcoming Monday.

## 2018-10-31 NOTE — Telephone Encounter (Signed)
I spoke with patient,gave him lab results, he will take an extra 40 mq potassium today and repeat bmet on Monday  11/03/18

## 2018-11-03 ENCOUNTER — Encounter (HOSPITAL_COMMUNITY): Payer: Self-pay | Admitting: *Deleted

## 2018-11-03 ENCOUNTER — Other Ambulatory Visit (HOSPITAL_COMMUNITY)
Admission: RE | Admit: 2018-11-03 | Discharge: 2018-11-03 | Disposition: A | Discharge status: Home / Self Care | Payer: Medicare HMO | Source: Ambulatory Visit | Attending: Cardiovascular Disease | Admitting: Cardiovascular Disease

## 2018-11-03 ENCOUNTER — Telehealth: Payer: Self-pay

## 2018-11-03 ENCOUNTER — Emergency Department (HOSPITAL_COMMUNITY)
Admission: EM | Admit: 2018-11-03 | Discharge: 2018-11-03 | Disposition: A | Discharge status: Home / Self Care | Payer: Medicare HMO | Attending: Emergency Medicine | Admitting: Emergency Medicine

## 2018-11-03 ENCOUNTER — Other Ambulatory Visit: Payer: Self-pay

## 2018-11-03 DIAGNOSIS — Z87891 Personal history of nicotine dependence: Secondary | ICD-10-CM | POA: Insufficient documentation

## 2018-11-03 DIAGNOSIS — E119 Type 2 diabetes mellitus without complications: Secondary | ICD-10-CM | POA: Diagnosis not present

## 2018-11-03 DIAGNOSIS — E876 Hypokalemia: Secondary | ICD-10-CM | POA: Insufficient documentation

## 2018-11-03 DIAGNOSIS — X58XXXA Exposure to other specified factors, initial encounter: Secondary | ICD-10-CM | POA: Diagnosis not present

## 2018-11-03 DIAGNOSIS — Y998 Other external cause status: Secondary | ICD-10-CM | POA: Diagnosis not present

## 2018-11-03 DIAGNOSIS — I5040 Unspecified combined systolic (congestive) and diastolic (congestive) heart failure: Secondary | ICD-10-CM | POA: Diagnosis not present

## 2018-11-03 DIAGNOSIS — Z950 Presence of cardiac pacemaker: Secondary | ICD-10-CM | POA: Insufficient documentation

## 2018-11-03 DIAGNOSIS — Y9289 Other specified places as the place of occurrence of the external cause: Secondary | ICD-10-CM | POA: Insufficient documentation

## 2018-11-03 DIAGNOSIS — I110 Hypertensive heart disease with heart failure: Secondary | ICD-10-CM | POA: Diagnosis not present

## 2018-11-03 DIAGNOSIS — Y9389 Activity, other specified: Secondary | ICD-10-CM | POA: Diagnosis not present

## 2018-11-03 DIAGNOSIS — E785 Hyperlipidemia, unspecified: Secondary | ICD-10-CM | POA: Diagnosis not present

## 2018-11-03 DIAGNOSIS — Z79899 Other long term (current) drug therapy: Secondary | ICD-10-CM | POA: Diagnosis not present

## 2018-11-03 DIAGNOSIS — S20312A Abrasion of left front wall of thorax, initial encounter: Secondary | ICD-10-CM | POA: Insufficient documentation

## 2018-11-03 LAB — BASIC METABOLIC PANEL
Anion gap: 12 (ref 5–15)
BUN: 35 mg/dL — ABNORMAL HIGH (ref 8–23)
CO2: 30 mmol/L (ref 22–32)
Calcium: 9.1 mg/dL (ref 8.9–10.3)
Chloride: 93 mmol/L — ABNORMAL LOW (ref 98–111)
Creatinine, Ser: 1.45 mg/dL — ABNORMAL HIGH (ref 0.61–1.24)
GFR calc Af Amer: 55 mL/min — ABNORMAL LOW (ref >60)
GFR calc non Af Amer: 48 mL/min — ABNORMAL LOW (ref >60)
Glucose, Bld: 109 mg/dL — ABNORMAL HIGH (ref 70–99)
Potassium: 3.1 mmol/L — ABNORMAL LOW (ref 3.5–5.1)
Sodium: 135 mmol/L (ref 135–145)

## 2018-11-03 NOTE — Discharge Instructions (Signed)
Keep pressure dressing on for the next 2 days.  See your Physician for recheck

## 2018-11-03 NOTE — ED Triage Notes (Signed)
  Pt noted clear drainage coming from below pacemaker last night and this morning again. Larger bandaid applied to site of where drainage is.

## 2018-11-03 NOTE — ED Triage Notes (Signed)
No redness noted at site of pacemaker.  Drainage noted to dsg ( above left nipple). Denies pain or fevers.

## 2018-11-03 NOTE — Telephone Encounter (Signed)
Called pt on home phone, call would not go through. Called cell phone and left message for pt to return call.

## 2018-11-03 NOTE — Telephone Encounter (Signed)
I spoke with patient, he will take potassium 80 meq BID for the next 3 days and the resume 80 meq daily, repeat bmet on Friday 8/14

## 2018-11-03 NOTE — ED Provider Notes (Signed)
[facility] EMERGENCY DEPARTMENT Provider Note   CSN: [id number] Arrival date & time: 11/03/18  1119     History   Chief Complaint Chief Complaint  Patient presents with  . Pacemaker Problem    HPI John [name] is a 73 y.o. male.     Pt complains of draining from an area on his chest.  Pt thought drainage was coming from his pacemaker.  Bandaid is not near pacemaker.  Pt reports drainage soaked dressing.  Drainage is clear.   The history is provided by the patient. No language interpreter was used.    Past Medical History:  Diagnosis Date  . Atrial flutter ([place]) 12/2010   Admitted with symptomatic bradycardia (HR 40s), atrial flutter with slow ventricular response 12/2010 + volume overload; AV nodal agents d/c'd and Pradaxa started; RFA in 01/2011  . CHF (congestive heart failure) ([place])   . Chronic combined systolic and diastolic heart failure (HCC)    echo 01/06/11: mild LVH, EF 65-70%, mod to severe LAE, mild RVE, mild RAE, PASP 32;   TEE 10/12: EF 45-50%   . Class 2 severe obesity due to excess calories with serious comorbidity and body mass index (BMI) of 37.0 to 37.9 in adult ([place]) 07/17/2017  . Diabetes mellitus    non insulin dependant  . Hyperlipidemia   . Hypertension   . Osteoarthritis   . Presence of permanent cardiac pacemaker   . PSVT (paroxysmal supraventricular tachycardia) (HCC)    Possibly atrial flutter    Patient Active Problem List   Diagnosis Date Noted  . CHF (congestive heart failure) ([place]) 08/19/2018  . Stage 3 chronic kidney disease ([place])   . Paroxysmal atrial fibrillation (HCC)   . Acute on chronic systolic CHF (congestive heart failure) ([place]) 03/02/2018  . Pacemaker 03/02/2018  . History of colonic polyps 12/28/2016  . Atrial pacemaker lead displacement 12/07/2015  . Acute CHF (congestive heart failure) ([place]) 11/21/2015  . CHF, acute on chronic ([place]) 11/20/2015  . Complete heart block ([place]) 11/20/2015  . Chronic diastolic CHF  (congestive heart failure) ([place]) 08/28/2015  . Hypertensive heart disease 08/28/2015  . NSVT (nonsustained ventricular tachycardia) ([place]) 08/28/2015  . Elevated troponin 08/28/2015  . Second degree heart block 08/25/2015  . Bradycardia 08/25/2015  . Acute diastolic CHF (congestive heart failure) ([place]) 08/25/2015  . Current use of long term anticoagulation 05/04/2011  . Atrial flutter ([place]) 01/05/2011  . DM type 2 causing vascular disease ([place]) 12/23/2009  . Mixed hyperlipidemia 12/23/2009  . Essential hypertension 12/23/2009  . DEGENERATIVE JOINT DISEASE 12/23/2009    Past Surgical History:  Procedure Laterality Date  . ATRIAL FLUTTER ABLATION N/A 02/07/2011   Procedure: ATRIAL FLUTTER ABLATION;  Surgeon: Evans Lance, MD;  Location: [facility] CATH LAB;  Service: Cardiovascular;  Laterality: N/A;  . CARDIAC ELECTROPHYSIOLOGY STUDY AND ABLATION  02/07/11  . CARDIOVERSION N/A 03/23/2016   Procedure: CARDIOVERSION;  Surgeon: Evans Lance, MD;  Location: [facility];  Service: Cardiovascular;  Laterality: N/A;  . COLONOSCOPY N/A 04/17/2017   Procedure: COLONOSCOPY;  Surgeon: Rogene Houston, MD;  [facility] ENDO SU[facility];  Service: Endoscopy;  Laterality: N/A;  830  . EP IMPLANTABLE DEVICE N/A 08/29/2015   Procedure: Pacemaker Implant;  Surgeon: Evans Lance, MD;  Location: [facility] CV LAB;  Service: Cardiovascular;  Laterality: N/A;  . EP IMPLANTABLE DEVICE N/A 12/07/2015   Procedure: PPM Lead Revision/Repair;  Surgeon: Evans Lance, MD;  Location: [facility] CV LAB;  Service: Cardiovascular;  Laterality: N/A;  . KNEE ARTHROSCOPY  2005   left  . LUMBAR SPINE SURGERY     "I've had 6 ORs 1972 thru 2004"  . POLYPECTOMY  04/17/2017   Procedure: POLYPECTOMY;  Surgeon: Rogene Houston, MD;  Location: [facility];  Service: Endoscopy;;  transverse colon x3;        Home Medications    Prior to Admission medications   Medication Sig Start Date End Date Taking? Authorizing Provider   acetaminophen (TYLENOL) 500 MG tablet Take 1,000 mg by mouth every 6 (six) hours as needed for moderate pain (thigh pain).    [provider]  amLODipine (NORVASC) 10 MG tablet Take 1 tablet (10 mg total) by mouth daily. One daily 06/30/18   Mikey Kirschner, MD  benzonatate (TESSALON) 100 MG capsule TAKE 1 CAPSULE (100 MG TOTAL) BY MOUTH EVERY 6 (SIX) HOURS AS NEEDED FOR COUGH. 09/09/18 09/09/19  Lendon Colonel, NP  carvedilol (COREG) 25 MG tablet TAKE 1 TABLET (25 MG TOTAL) BY MOUTH 2 (TWO) TIMES DAILY WITH A MEAL. 09/18/18   Herminio Commons, MD  Dulaglutide (TRULICITY) 1.5 BO/1.7PZ SOPN Inject 1.5 mg into the skin once a week. 10/09/18   Cassandria Anger, MD  ELIQUIS 5 MG TABS tablet TAKE 1 TABLET TWICE DAILY Patient taking differently: Take 5 mg by mouth 2 (two) times daily.  05/14/18   Evans Lance, MD  glucose blood test strip 1 each by Other route as needed. Use as instructed bid 07/23/17   Cassandria Anger, MD  HYDROcodone-acetaminophen (NORCO/VICODIN) 5-325 MG tablet Take one tablet po every 4 hrs prn pain 10/10/18   Mikey Kirschner, MD  Insulin Pen Needle 31G X 8 MM MISC USE EVERY DAY AS DIRECTED BY PHYSICIAN 03/20/18   Cassandria Anger, MD  Magnesium 400 MG CAPS Take 400 mg by mouth daily.     [provider]  metFORMIN (GLUCOPHAGE) 500 MG tablet Take 1 tablet (500 mg total) by mouth 2 (two) times daily with a meal. 09/19/18   Nida, Marella Chimes, MD  Multiple Vitamins-Minerals (MULTIVITAMIN WITH MINERALS) tablet Take 1 tablet by mouth daily.    [provider]  potassium chloride SA (KLOR-CON M20) 20 MEQ tablet 10/30/2018 Take 80 meq (4 tablets) every day 10/30/18   Herminio Commons, MD  Resveratrol 100 MG CAPS Take 100 mg by mouth at bedtime.     [provider]  rosuvastatin (CRESTOR) 20 MG tablet Take 1 tablet (20 mg total) by mouth daily. 06/30/18   Mikey Kirschner, MD  torsemide (DEMADEX) 20 MG tablet Take 3 tablets (60 mg  total) by mouth 2 (two) times daily. 10/25/18   Cheryln Manly, NP  TRESIBA FLEXTOUCH 200 UNIT/ML SOPN INJECT 70 UNITS SUBCUTANEOUSLY AT BEDTIME 10/16/18   Cassandria Anger, MD    Family History Family History  Problem Relation Age of Onset  . Heart attack Mother   . Hypertension Mother   . Heart attack Father   . Hypertension Father   . Heart attack Brother   . Colon cancer Neg Hx     Social History Social History   Tobacco Use  . Smoking status: Former Smoker    Packs/day: 1.00    Years: 10.00    Pack years: 10.00    Types: Cigarettes    Quit date: 03/26/1986    Years since quitting: 32.6  . Smokeless tobacco: Never Used  . Tobacco comment: "stopped cigarette  smoking  1988"  Substance Use Topics  . Alcohol use: Never    Alcohol/week: 0.0 standard drinks    Frequency: Never    Comment: "quit alcohol ~ 2007"  . Drug use: No     Allergies   Patient has no known allergies.   Review of Systems Review of Systems  Skin: Positive for wound.  All other systems reviewed and are negative.    Physical Exam Updated Vital Signs BP 109/72 (BP Location: Right Arm)   Pulse 82   Temp 97.9 F (36.6 C) (Oral)   Resp 14   Ht 5' 11"  (1.803 m)   Wt 116.1 kg   SpO2 97%   BMI 35.70 kg/m   Physical Exam Cardiovascular:     Rate and Rhythm: Normal rate.  Pulmonary:     Effort: Pulmonary effort is normal.  Skin:    General: Skin is warm.     Comments: Pinpoint open area left chest above nipple  Small amount of serous drainge.  Neurological:     General: No focal deficit present.  Psychiatric:        Mood and Affect: Mood normal.      ED Treatments / Results  Labs (all labs ordered are listed, but only abnormal results are displayed) Labs Reviewed - No data to display  EKG None  Radiology No results found.  Procedures Procedures (including critical care time)  Medications Ordered in ED Medications - No data to display   Initial Impression /  Assessment and Plan / ED Course  I have reviewed the triage vital signs and the nursing notes.  Pertinent labs & imaging results that were available during my care of the patient were reviewed by me and considered in my medical decision making (see chart for details).        MDM   Area could be pimple/bite.  Very small  No sign of infection.   I advised rn to place a pressure dressing.  Pt advised to leave in place x 2 days.  See primary for recheck in 2 days   Final Clinical Impressions(s) / ED Diagnoses   Final diagnoses:  Abrasion of left chest wall, initial encounter    ED Discharge Orders    None    An After Visit Summary was printed and given to the patient.    Fransico Meadow, PA-C 11/03/18 [place], [place], DO 11/06/18 1538

## 2018-11-03 NOTE — Telephone Encounter (Signed)
-----   Message from Herminio Commons, MD sent at 11/03/2018 11:40 AM EDT ----- K still mildly low. Have him take 80 meq bid today, 8/11, and 8/12, then reduce to 80 meq daily. Repeat BMET this Friday.

## 2018-11-05 ENCOUNTER — Other Ambulatory Visit (HOSPITAL_COMMUNITY)
Admission: RE | Admit: 2018-11-05 | Discharge: 2018-11-05 | Disposition: A | Discharge status: Home / Self Care | Payer: Medicare HMO | Source: Ambulatory Visit | Attending: Cardiovascular Disease | Admitting: Cardiovascular Disease

## 2018-11-05 ENCOUNTER — Ambulatory Visit (INDEPENDENT_AMBULATORY_CARE_PROVIDER_SITE_OTHER): Payer: Medicare HMO | Admitting: Family Medicine

## 2018-11-05 ENCOUNTER — Telehealth: Payer: Self-pay

## 2018-11-05 ENCOUNTER — Telehealth: Payer: Self-pay | Admitting: Cardiovascular Disease

## 2018-11-05 ENCOUNTER — Other Ambulatory Visit: Payer: Self-pay

## 2018-11-05 VITALS — BP 110/70 | Temp 98.4°F | Ht 71.0 in | Wt 260.0 lb

## 2018-11-05 DIAGNOSIS — E876 Hypokalemia: Secondary | ICD-10-CM | POA: Insufficient documentation

## 2018-11-05 DIAGNOSIS — R21 Rash and other nonspecific skin eruption: Secondary | ICD-10-CM

## 2018-11-05 LAB — BASIC METABOLIC PANEL
Anion gap: 13 (ref 5–15)
BUN: 34 mg/dL — ABNORMAL HIGH (ref 8–23)
CO2: 27 mmol/L (ref 22–32)
Calcium: 9.1 mg/dL (ref 8.9–10.3)
Chloride: 95 mmol/L — ABNORMAL LOW (ref 98–111)
Creatinine, Ser: 1.37 mg/dL — ABNORMAL HIGH (ref 0.61–1.24)
GFR calc Af Amer: 59 mL/min — ABNORMAL LOW (ref >60)
GFR calc non Af Amer: 51 mL/min — ABNORMAL LOW (ref >60)
Glucose, Bld: 112 mg/dL — ABNORMAL HIGH (ref 70–99)
Potassium: 3.6 mmol/L (ref 3.5–5.1)
Sodium: 135 mmol/L (ref 135–145)

## 2018-11-05 NOTE — Telephone Encounter (Signed)
Placed order for BMET.

## 2018-11-05 NOTE — Progress Notes (Signed)
   Subjective:    Patient ID: John [name], male    DOB: Oct 20, 1945, 73 y.o.   MRN: [id number]  HPIER follow up Abrasion of left chest.  Sob at night for the past 3 -4 nights.  Results for orders placed or performed during the hospital encounter of 82/99/37  Basic metabolic panel  Result Value Ref Range   Sodium 135 135 - 145 mmol/L   Potassium 3.1 (L) 3.5 - 5.1 mmol/L   Chloride 93 (L) 98 - 111 mmol/L   CO2 30 22 - 32 mmol/L   Glucose, Bld 109 (H) 70 - 99 mg/dL   BUN 35 (H) 8 - 23 mg/dL   Creatinine, Ser 1.45 (H) 0.61 - 1.24 mg/dL   Calcium 9.1 8.9 - 10.3 mg/dL   GFR calc non Af Amer 48 (L) >60 mL/min   GFR calc Af Amer 55 (L) >60 mL/min   Anion gap 12 5 - 15    Patient presents primarily for a weepng wpound which  occurred on his anterior chest.  He was not sure whether this was a bite or puncture wound.  Some clear fluid exuded from it.  This worried him because it was close to his pacemaker.  He went on to the emergency room.  Patient continues to work closely with a cardiologist for his congestive heart failure.  They are monitoring his diuretic intake and weight and renal function.  Review of Systems No headache, no major weight loss or weight gain, no chest pain no back pain abdominal pain no change in bowel habits complete ROS otherwise negative     Objective:   Physical Exam Alert vitals stable, NAD. Blood pressure good on repeat. HEENT normal. Lungs clear. Heart regular rate and rhythm. Left anterior chest small puncture wound no evidence of infection       Assessment & Plan:  Impression puncture wound with clear exudate now resolved.  I did advise the patient once again is congestive heart failure and renal function needs to be managed by his cardiologist.  Lungs are clear at this time no evidence of distress.

## 2018-11-05 NOTE — Telephone Encounter (Signed)
-----   Message from Herminio Commons, MD sent at 11/05/2018 11:55 AM EDT ----- K is normal. Repeat in one week.

## 2018-11-05 NOTE — Telephone Encounter (Signed)
Returned pt call. NA unable to leave msg.

## 2018-11-05 NOTE — Telephone Encounter (Signed)
Returning call from [place] for lab results / tg

## 2018-11-06 ENCOUNTER — Telehealth: Payer: Self-pay | Admitting: Medical

## 2018-11-06 ENCOUNTER — Other Ambulatory Visit: Payer: Self-pay

## 2018-11-06 ENCOUNTER — Other Ambulatory Visit: Payer: Self-pay | Admitting: *Deleted

## 2018-11-06 MED ORDER — POTASSIUM CHLORIDE CRYS ER 20 MEQ PO TBCR: EXTENDED_RELEASE_TABLET | ORAL | 2 refills | Status: DC

## 2018-11-06 NOTE — Patient Outreach (Signed)
[facility]) Care Management  11/06/2018  John [name] 1946-02-22 [id number]   Telephone Screen  Referral Date: 11/06/2018  Referral Source: Join EMMI Call Referral Reason: " patient engagement score 8, DM, A-fib, Pacer" Insurance: [employer]   Outreach attempt # 1 to patient. Spoke with patient and screening completed. Patient resides in is home along with his spouse. He voices that he is independent with ADLs/IADLs. He denies any recent falls or use of assistive devices. He drives himself to medical appts.  Conditions: Per chart review and patient report, he has PMH of pacer, A-flutter, CHF, obesity, DM, HTN, OA and CKD. Patient states that he is monitoring cbgs int he home 2x/day. He voices fasting blood sugars range in the 90s-120s. Per records, last A1C was 7.8(June 2002). He states that he also has scale in the home and monitors weight daily. Patient reports that weight is stable. He reports that he was not originally on diuretic but has recently had to start taking one. He reports no edema and/or SOB at present at present but occassionally has some edema.   Medications: Per patient, he is taking about 8 meds. He fills med planner himself weekly. He denies any issues at present with affording and/or managing meds.   Appointments: Patient followed by PCP and sees regularly. He voices he saw PCP this month already.   Consent:[facility] services reviewed and discussed with patient. Verbal consent for services given. Patient has no RN CM care coordination needs. He is agreeable to further education/mgmt of chronic illnesses.  Plan: RN CM will send [place] referral for further disease mgmt/education and support for chronic conditions.     Enzo Montgomery, RN,BSN,CCM [facility] Telephonic Care Management Coordinator Direct Phone: [phone] Toll Free: [phone] Fax: [phone]

## 2018-11-06 NOTE — Telephone Encounter (Signed)
Spoke with pt regarding dosage and frequency of both potassium and torsemide. Pt voiced understanding

## 2018-11-06 NOTE — Telephone Encounter (Signed)
Patient called stating that he was out of his potassium pills. Appears recent blood work with K 2.7, for which he is on aggressive electrolyte repletion with KDUR 80 mEq daily. Rx sent to his pharmacy for ongoing management of hypokalemia.   Abigail Butts, PA-C 11/06/18; 6:11 PM

## 2018-11-10 ENCOUNTER — Other Ambulatory Visit: Payer: Self-pay

## 2018-11-11 ENCOUNTER — Telehealth: Payer: Self-pay | Admitting: Medical

## 2018-11-11 ENCOUNTER — Other Ambulatory Visit: Payer: Self-pay

## 2018-11-11 ENCOUNTER — Emergency Department (HOSPITAL_COMMUNITY): Payer: Medicare HMO

## 2018-11-11 ENCOUNTER — Inpatient Hospital Stay (HOSPITAL_COMMUNITY)
Admission: EM | Admit: 2018-11-11 | Discharge: 2018-11-17 | DRG: 291 | Disposition: A | Discharge status: Home Health | Payer: Medicare HMO | Attending: Internal Medicine | Admitting: Internal Medicine

## 2018-11-11 ENCOUNTER — Encounter (HOSPITAL_COMMUNITY): Payer: Self-pay

## 2018-11-11 DIAGNOSIS — I350 Nonrheumatic aortic (valve) stenosis: Secondary | ICD-10-CM | POA: Diagnosis present

## 2018-11-11 DIAGNOSIS — N179 Acute kidney failure, unspecified: Secondary | ICD-10-CM | POA: Diagnosis present

## 2018-11-11 DIAGNOSIS — I4892 Unspecified atrial flutter: Secondary | ICD-10-CM | POA: Diagnosis not present

## 2018-11-11 DIAGNOSIS — Z8249 Family history of ischemic heart disease and other diseases of the circulatory system: Secondary | ICD-10-CM

## 2018-11-11 DIAGNOSIS — I110 Hypertensive heart disease with heart failure: Secondary | ICD-10-CM | POA: Diagnosis not present

## 2018-11-11 DIAGNOSIS — Z20828 Contact with and (suspected) exposure to other viral communicable diseases: Secondary | ICD-10-CM | POA: Diagnosis not present

## 2018-11-11 DIAGNOSIS — E1159 Type 2 diabetes mellitus with other circulatory complications: Secondary | ICD-10-CM

## 2018-11-11 DIAGNOSIS — Z950 Presence of cardiac pacemaker: Secondary | ICD-10-CM | POA: Diagnosis not present

## 2018-11-11 DIAGNOSIS — Z87891 Personal history of nicotine dependence: Secondary | ICD-10-CM

## 2018-11-11 DIAGNOSIS — I509 Heart failure, unspecified: Secondary | ICD-10-CM | POA: Diagnosis not present

## 2018-11-11 DIAGNOSIS — I442 Atrioventricular block, complete: Secondary | ICD-10-CM | POA: Diagnosis not present

## 2018-11-11 DIAGNOSIS — I5022 Chronic systolic (congestive) heart failure: Secondary | ICD-10-CM | POA: Diagnosis present

## 2018-11-11 DIAGNOSIS — E871 Hypo-osmolality and hyponatremia: Secondary | ICD-10-CM | POA: Diagnosis not present

## 2018-11-11 DIAGNOSIS — N183 Chronic kidney disease, stage 3 unspecified: Secondary | ICD-10-CM | POA: Diagnosis present

## 2018-11-11 DIAGNOSIS — I130 Hypertensive heart and chronic kidney disease with heart failure and stage 1 through stage 4 chronic kidney disease, or unspecified chronic kidney disease: Principal | ICD-10-CM | POA: Diagnosis present

## 2018-11-11 DIAGNOSIS — E782 Mixed hyperlipidemia: Secondary | ICD-10-CM | POA: Diagnosis present

## 2018-11-11 DIAGNOSIS — I10 Essential (primary) hypertension: Secondary | ICD-10-CM | POA: Diagnosis present

## 2018-11-11 DIAGNOSIS — E1121 Type 2 diabetes mellitus with diabetic nephropathy: Secondary | ICD-10-CM | POA: Diagnosis not present

## 2018-11-11 DIAGNOSIS — E876 Hypokalemia: Secondary | ICD-10-CM | POA: Diagnosis not present

## 2018-11-11 DIAGNOSIS — E669 Obesity, unspecified: Secondary | ICD-10-CM

## 2018-11-11 DIAGNOSIS — I5023 Acute on chronic systolic (congestive) heart failure: Secondary | ICD-10-CM

## 2018-11-11 DIAGNOSIS — I4819 Other persistent atrial fibrillation: Secondary | ICD-10-CM | POA: Diagnosis not present

## 2018-11-11 DIAGNOSIS — M1990 Unspecified osteoarthritis, unspecified site: Secondary | ICD-10-CM | POA: Diagnosis present

## 2018-11-11 DIAGNOSIS — I480 Paroxysmal atrial fibrillation: Secondary | ICD-10-CM | POA: Diagnosis not present

## 2018-11-11 DIAGNOSIS — Z794 Long term (current) use of insulin: Secondary | ICD-10-CM | POA: Diagnosis not present

## 2018-11-11 DIAGNOSIS — E1122 Type 2 diabetes mellitus with diabetic chronic kidney disease: Secondary | ICD-10-CM | POA: Diagnosis present

## 2018-11-11 DIAGNOSIS — R0602 Shortness of breath: Secondary | ICD-10-CM | POA: Diagnosis not present

## 2018-11-11 DIAGNOSIS — E785 Hyperlipidemia, unspecified: Secondary | ICD-10-CM | POA: Diagnosis present

## 2018-11-11 DIAGNOSIS — I471 Supraventricular tachycardia: Secondary | ICD-10-CM | POA: Diagnosis present

## 2018-11-11 DIAGNOSIS — E66812 Obesity, class 2: Secondary | ICD-10-CM

## 2018-11-11 DIAGNOSIS — I5043 Acute on chronic combined systolic (congestive) and diastolic (congestive) heart failure: Secondary | ICD-10-CM | POA: Diagnosis present

## 2018-11-11 LAB — COMPREHENSIVE METABOLIC PANEL
ALT: 29 U/L (ref 0–44)
AST: 43 U/L — ABNORMAL HIGH (ref 15–41)
Albumin: 3.6 g/dL (ref 3.5–5.0)
Alkaline Phosphatase: 173 U/L — ABNORMAL HIGH (ref 38–126)
Anion gap: 8 (ref 5–15)
BUN: 33 mg/dL — ABNORMAL HIGH (ref 8–23)
CO2: 22 mmol/L (ref 22–32)
Calcium: 8.7 mg/dL — ABNORMAL LOW (ref 8.9–10.3)
Chloride: 101 mmol/L (ref 98–111)
Creatinine, Ser: 1.64 mg/dL — ABNORMAL HIGH (ref 0.61–1.24)
GFR calc Af Amer: 48 mL/min — ABNORMAL LOW (ref >60)
GFR calc non Af Amer: 41 mL/min — ABNORMAL LOW (ref >60)
Glucose, Bld: 122 mg/dL — ABNORMAL HIGH (ref 70–99)
Potassium: 4.8 mmol/L (ref 3.5–5.1)
Sodium: 131 mmol/L — ABNORMAL LOW (ref 135–145)
Total Bilirubin: 2.7 mg/dL — ABNORMAL HIGH (ref 0.3–1.2)
Total Protein: 8.1 g/dL (ref 6.5–8.1)

## 2018-11-11 LAB — BRAIN NATRIURETIC PEPTIDE: B Natriuretic Peptide: 1718 pg/mL — ABNORMAL HIGH (ref 0.0–100.0)

## 2018-11-11 LAB — CBC WITH DIFFERENTIAL/PLATELET
Abs Immature Granulocytes: 0.07 10*3/uL (ref 0.00–0.07)
Basophils Absolute: 0.1 10*3/uL (ref 0.0–0.1)
Basophils Relative: 1 %
Eosinophils Absolute: 0.2 10*3/uL (ref 0.0–0.5)
Eosinophils Relative: 2 %
HCT: 39.6 % (ref 39.0–52.0)
Hemoglobin: 13 g/dL (ref 13.0–17.0)
Immature Granulocytes: 1 %
Lymphocytes Relative: 20 %
Lymphs Abs: 1.7 10*3/uL (ref 0.7–4.0)
MCH: 28.4 pg (ref 26.0–34.0)
MCHC: 32.8 g/dL (ref 30.0–36.0)
MCV: 86.5 fL (ref 80.0–100.0)
Monocytes Absolute: 1.2 10*3/uL — ABNORMAL HIGH (ref 0.1–1.0)
Monocytes Relative: 13 %
Neutro Abs: 5.5 10*3/uL (ref 1.7–7.7)
Neutrophils Relative %: 63 %
Platelets: 150 10*3/uL (ref 150–400)
RBC: 4.58 MIL/uL (ref 4.22–5.81)
RDW: 19.2 % — ABNORMAL HIGH (ref 11.5–15.5)
WBC: 8.7 10*3/uL (ref 4.0–10.5)
nRBC: 0 % (ref 0.0–0.2)

## 2018-11-11 LAB — GLUCOSE, CAPILLARY: Glucose-Capillary: 105 mg/dL — ABNORMAL HIGH (ref 70–99)

## 2018-11-11 MED ORDER — INSULIN ASPART 100 UNIT/ML ~~LOC~~ SOLN
0.0000 [IU] | Freq: Three times a day (TID) | SUBCUTANEOUS | Status: DC
  Administered 2018-11-12: 3 [IU] via SUBCUTANEOUS
  Administered 2018-11-13 (×2): 2 [IU] via SUBCUTANEOUS
  Administered 2018-11-17: 1 [IU] via SUBCUTANEOUS

## 2018-11-11 MED ORDER — FUROSEMIDE 10 MG/ML IJ SOLN
60.0000 mg | Freq: Once | INTRAMUSCULAR | Status: AC
  Administered 2018-11-11: 60 mg via INTRAVENOUS
  Filled 2018-11-11: qty 6

## 2018-11-11 MED ORDER — BENZONATATE 100 MG PO CAPS: 100.0000 mg | ORAL_CAPSULE | Freq: Three times a day (TID) | ORAL | Status: DC | PRN

## 2018-11-11 MED ORDER — ROSUVASTATIN CALCIUM 20 MG PO TABS
20.0000 mg | ORAL_TABLET | Freq: Every day | ORAL | Status: DC
  Administered 2018-11-12 – 2018-11-16 (×5): 20 mg via ORAL
  Filled 2018-11-11 (×5): qty 1

## 2018-11-11 MED ORDER — CARVEDILOL 12.5 MG PO TABS
25.0000 mg | ORAL_TABLET | Freq: Two times a day (BID) | ORAL | Status: DC
  Administered 2018-11-11 – 2018-11-17 (×12): 25 mg via ORAL
  Filled 2018-11-11 (×13): qty 2

## 2018-11-11 MED ORDER — AMLODIPINE BESYLATE 5 MG PO TABS
10.0000 mg | ORAL_TABLET | Freq: Every day | ORAL | Status: DC
  Administered 2018-11-13: 10 mg via ORAL
  Filled 2018-11-11 (×2): qty 2

## 2018-11-11 MED ORDER — ACETAMINOPHEN 325 MG PO TABS: 650.0000 mg | ORAL_TABLET | ORAL | Status: DC | PRN

## 2018-11-11 MED ORDER — SODIUM CHLORIDE 0.9 % IV SOLN: 250.0000 mL | INTRAVENOUS | Status: DC | PRN

## 2018-11-11 MED ORDER — SODIUM CHLORIDE 0.9% FLUSH
3.0000 mL | Freq: Two times a day (BID) | INTRAVENOUS | Status: DC
  Administered 2018-11-11 – 2018-11-17 (×12): 3 mL via INTRAVENOUS

## 2018-11-11 MED ORDER — MAGNESIUM OXIDE 400 (241.3 MG) MG PO TABS
400.0000 mg | ORAL_TABLET | Freq: Every day | ORAL | Status: DC
  Administered 2018-11-12 – 2018-11-16 (×5): 400 mg via ORAL
  Filled 2018-11-11 (×5): qty 1

## 2018-11-11 MED ORDER — APIXABAN 5 MG PO TABS
5.0000 mg | ORAL_TABLET | Freq: Two times a day (BID) | ORAL | Status: DC
  Administered 2018-11-11 – 2018-11-17 (×12): 5 mg via ORAL
  Filled 2018-11-11 (×12): qty 1

## 2018-11-11 MED ORDER — INSULIN ASPART 100 UNIT/ML ~~LOC~~ SOLN
0.0000 [IU] | Freq: Every day | SUBCUTANEOUS | Status: DC
  Administered 2018-11-13: 2 [IU] via SUBCUTANEOUS
  Administered 2018-11-16: 3 [IU] via SUBCUTANEOUS

## 2018-11-11 MED ORDER — SODIUM CHLORIDE 0.9% FLUSH: 3.0000 mL | INTRAVENOUS | Status: DC | PRN

## 2018-11-11 MED ORDER — INSULIN GLARGINE 100 UNIT/ML ~~LOC~~ SOLN
40.0000 [IU] | Freq: Every day | SUBCUTANEOUS | Status: DC
  Administered 2018-11-11 – 2018-11-13 (×3): 40 [IU] via SUBCUTANEOUS
  Filled 2018-11-11 (×6): qty 0.4

## 2018-11-11 MED ORDER — FUROSEMIDE 10 MG/ML IJ SOLN
60.0000 mg | Freq: Two times a day (BID) | INTRAMUSCULAR | Status: DC
  Filled 2018-11-11: qty 6

## 2018-11-11 MED ORDER — ONDANSETRON HCL 4 MG/2ML IJ SOLN: 4.0000 mg | Freq: Four times a day (QID) | INTRAMUSCULAR | Status: DC | PRN

## 2018-11-11 NOTE — ED Triage Notes (Signed)
Pt has been SOB for the last 2-3 days. Was sent by PCP for admission due to fluid overload. Pt has CHF and a pacemaker.

## 2018-11-11 NOTE — Telephone Encounter (Signed)
   Received a call from Duwayne Heck, DPT who was performing a home visit with Mr. Go today and reported patient had gained 16lbs in 4 days, had 3+ pitting edema on exam, significant decline in exercise tolerance, and orthopnea. He has been taking torsemide 58m BID for management of CHF. Also struggling with hypokalemia and difficulty obtaining the proper prescription for po supplements (was only given 12 tabs despite being prescribed a 30 day supply). He has CKD stage 3 with most recent Cr 1.37 11/05/2018. Given signs/symptoms suggesting significant volume overload, recent trouble with hypokalemia and AoCKD, patient was recommended to present to the ER for IV diuresis.   Also spoke with patients pharmacy to clarify issues with potassium prescription. Pharmacy confirmed patient will be able to get a months supply when he picks up his prescription again.   Patient in agreement with plans to present to the ED. AForestine [facility] triage RN notified of patients pending arrival.  KAbigail Butts PA-C 11/11/18; 5:25 PM

## 2018-11-11 NOTE — ED Provider Notes (Signed)
[facility] Provider Note   CSN: [id number] Arrival date & time: 11/11/18  1800     History   Chief Complaint Chief Complaint  Patient presents with  . Shortness of Breath    HPI John [name] is a 73 y.o. male.     John [name] is a 73 y.o. male with a history of CHF, a flutter s/p pacemaker, hypertension, hyperlipidemia, diabetes, obesity and arthritis, who presents to the emergency department for evaluation of shortness of breath and lower extremity swelling.  Patient reports over the past 4 to 6 days he has had increasing shortness of breath.  Shortness of breath is worse with exertion, he also reports worsening orthopnea.  He has had progressive worsening in his lower extremity swelling and reports he is up 16 pounds from his usual weight.  He denies any associated chest pain.  He has not had any fevers or cough.  No abdominal pain, nausea or vomiting.  He reports that he has been taking his torsemide, 60 twice daily regularly, and has not missed any doses.  Home health nurse came out to evaluate patient today and was concerned for fluid overload, contacted patient's PCP who recommended he present for evaluation and likely admission for CHF exacerbation/fluid overload.     Past Medical History:  Diagnosis Date  . Atrial flutter ([place]) 12/2010   Admitted with symptomatic bradycardia (HR 40s), atrial flutter with slow ventricular response 12/2010 + volume overload; AV nodal agents d/c'd and Pradaxa started; RFA in 01/2011  . CHF (congestive heart failure) ([place])   . Chronic combined systolic and diastolic heart failure (HCC)    echo 01/06/11: mild LVH, EF 65-70%, mod to severe LAE, mild RVE, mild RAE, PASP 32;   TEE 10/12: EF 45-50%   . Class 2 severe obesity due to excess calories with serious comorbidity and body mass index (BMI) of 37.0 to 37.9 in adult ([place]) 07/17/2017  . Diabetes mellitus    non insulin dependant  . Hyperlipidemia   . Hypertension    . Osteoarthritis   . Presence of permanent cardiac pacemaker   . PSVT (paroxysmal supraventricular tachycardia) (HCC)    Possibly atrial flutter    Patient Active Problem List   Diagnosis Date Noted  . CHF (congestive heart failure) ([place]) 08/19/2018  . Stage 3 chronic kidney disease ([place])   . Paroxysmal atrial fibrillation (HCC)   . Acute on chronic systolic CHF (congestive heart failure) ([place]) 03/02/2018  . Pacemaker 03/02/2018  . History of colonic polyps 12/28/2016  . Atrial pacemaker lead displacement 12/07/2015  . Acute CHF (congestive heart failure) ([place]) 11/21/2015  . CHF, acute on chronic ([place]) 11/20/2015  . Complete heart block ([place]) 11/20/2015  . Chronic diastolic CHF (congestive heart failure) ([place]) 08/28/2015  . Hypertensive heart disease 08/28/2015  . NSVT (nonsustained ventricular tachycardia) ([place]) 08/28/2015  . Elevated troponin 08/28/2015  . Second degree heart block 08/25/2015  . Bradycardia 08/25/2015  . Acute diastolic CHF (congestive heart failure) ([place]) 08/25/2015  . Current use of long term anticoagulation 05/04/2011  . Atrial flutter ([place]) 01/05/2011  . DM type 2 causing vascular disease ([place]) 12/23/2009  . Mixed hyperlipidemia 12/23/2009  . Essential hypertension 12/23/2009  . DEGENERATIVE JOINT DISEASE 12/23/2009    Past Surgical History:  Procedure Laterality Date  . ATRIAL FLUTTER ABLATION N/A 02/07/2011   Procedure: ATRIAL FLUTTER ABLATION;  Surgeon: Evans Lance, MD;  Location: [facility] CATH LAB;  Service: Cardiovascular;  Laterality: N/A;  .  CARDIAC ELECTROPHYSIOLOGY STUDY AND ABLATION  02/07/11  . CARDIOVERSION N/A 03/23/2016   Procedure: CARDIOVERSION;  Surgeon: Evans Lance, MD;  Location: [facility];  Service: Cardiovascular;  Laterality: N/A;  . COLONOSCOPY N/A 04/17/2017   Procedure: COLONOSCOPY;  Surgeon: Rogene Houston, MD;  Location: [facility];  Service: Endoscopy;  Laterality: N/A;  830  . EP IMPLANTABLE DEVICE N/A 08/29/2015    Procedure: Pacemaker Implant;  Surgeon: Evans Lance, MD;  Location: [facility] CV LAB;  Service: Cardiovascular;  Laterality: N/A;  . EP IMPLANTABLE DEVICE N/A 12/07/2015   Procedure: PPM Lead Revision/Repair;  Surgeon: Evans Lance, MD;  Location: [facility] CV LAB;  Service: Cardiovascular;  Laterality: N/A;  . KNEE ARTHROSCOPY  2005   left  . LUMBAR SPINE SURGERY     "I've had 6 ORs 1972 thru 2004"  . POLYPECTOMY  04/17/2017   Procedure: POLYPECTOMY;  Surgeon: Rogene Houston, MD;  Location: [facility];  Service: Endoscopy;;  transverse colon x3;        Home Medications    Prior to Admission medications   Medication Sig Start Date End Date Taking? Authorizing Provider  acetaminophen (TYLENOL) 500 MG tablet Take 1,000 mg by mouth every 6 (six) hours as needed for moderate pain (thigh pain).    [provider]  amLODipine (NORVASC) 10 MG tablet Take 1 tablet (10 mg total) by mouth daily. One daily 06/30/18   Mikey Kirschner, MD  benzonatate (TESSALON) 100 MG capsule TAKE 1 CAPSULE (100 MG TOTAL) BY MOUTH EVERY 6 (SIX) HOURS AS NEEDED FOR COUGH. 09/09/18 09/09/19  Lendon Colonel, NP  carvedilol (COREG) 25 MG tablet TAKE 1 TABLET (25 MG TOTAL) BY MOUTH 2 (TWO) TIMES DAILY WITH A MEAL. 09/18/18   Herminio Commons, MD  Dulaglutide (TRULICITY) 1.5 UJ/8.1XB SOPN Inject 1.5 mg into the skin once a week. 10/09/18   Cassandria Anger, MD  ELIQUIS 5 MG TABS tablet TAKE 1 TABLET TWICE DAILY Patient taking differently: Take 5 mg by mouth 2 (two) times daily.  05/14/18   Evans Lance, MD  glucose blood test strip 1 each by Other route as needed. Use as instructed bid 07/23/17   Cassandria Anger, MD  HYDROcodone-acetaminophen (NORCO/VICODIN) 5-325 MG tablet Take one tablet po every 4 hrs prn pain 10/10/18   Mikey Kirschner, MD  Insulin Pen Needle 31G X 8 MM MISC USE EVERY DAY AS DIRECTED BY PHYSICIAN 03/20/18   Cassandria Anger, MD  Magnesium 400 MG CAPS Take 400  mg by mouth daily.     [provider]  metFORMIN (GLUCOPHAGE) 500 MG tablet Take 1 tablet (500 mg total) by mouth 2 (two) times daily with a meal. 09/19/18   Nida, Marella Chimes, MD  Multiple Vitamins-Minerals (MULTIVITAMIN WITH MINERALS) tablet Take 1 tablet by mouth daily.    [provider]  potassium chloride SA (KLOR-CON M20) 20 MEQ tablet 10/30/2018 Take 80 meq (4 tablets) every day 11/06/18   Abigail Butts., PA-C  Resveratrol 100 MG CAPS Take 100 mg by mouth at bedtime.     [provider]  rosuvastatin (CRESTOR) 20 MG tablet Take 1 tablet (20 mg total) by mouth daily. 06/30/18   Mikey Kirschner, MD  torsemide (DEMADEX) 20 MG tablet Take 3 tablets (60 mg total) by mouth 2 (two) times daily. 10/25/18   Cheryln Manly, NP  TRESIBA FLEXTOUCH 200 UNIT/ML SOPN INJECT 70 UNITS SUBCUTANEOUSLY AT BEDTIME 10/16/18  Cassandria Anger, MD    Family History Family History  Problem Relation Age of Onset  . Heart attack Mother   . Hypertension Mother   . Heart attack Father   . Hypertension Father   . Heart attack Brother   . Colon cancer Neg Hx     Social History Social History   Tobacco Use  . Smoking status: Former Smoker    Packs/day: 1.00    Years: 10.00    Pack years: 10.00    Types: Cigarettes    Quit date: 03/26/1986    Years since quitting: 32.6  . Smokeless tobacco: Never Used  . Tobacco comment: "stopped cigarette  smoking 1988"  Substance Use Topics  . Alcohol use: Never    Alcohol/week: 0.0 standard drinks    Frequency: Never    Comment: "quit alcohol ~ 2007"  . Drug use: No     Allergies   Patient has no known allergies.   Review of Systems Review of Systems  Constitutional: Negative for chills and fever.  HENT: Negative.   Respiratory: Positive for shortness of breath. Negative for cough, chest tightness and wheezing.   Cardiovascular: Positive for leg swelling. Negative for chest pain and palpitations.  Gastrointestinal:  Negative for abdominal pain, diarrhea, nausea and vomiting.  Genitourinary: Negative for dysuria and frequency.  Musculoskeletal: Negative for myalgias.  Skin: Negative for color change and rash.  Neurological: Negative for dizziness, syncope, light-headedness and headaches.     Physical Exam Updated Vital Signs BP 112/77 (BP Location: Right Arm)   Pulse (!) 59   Temp (!) 97.5 F (36.4 C) (Oral)   Resp 15   Ht 5' 11"  (1.803 m)   Wt 122.5 kg   SpO2 97%   BMI 37.66 kg/m   Physical Exam Vitals signs and nursing note reviewed.  Constitutional:      General: He is not in acute distress.    Appearance: He is well-developed and normal weight. He is not ill-appearing or diaphoretic.  HENT:     Head: Normocephalic and atraumatic.     Mouth/Throat:     Mouth: Mucous membranes are moist.     Pharynx: Oropharynx is clear.  Eyes:     General:        Right eye: No discharge.        Left eye: No discharge.     Pupils: Pupils are equal, round, and reactive to light.  Neck:     Musculoskeletal: Neck supple.  Cardiovascular:     Rate and Rhythm: Normal rate and regular rhythm.     Pulses: Normal pulses.     Heart sounds: Normal heart sounds. No murmur. No friction rub. No gallop.   Pulmonary:     Effort: Pulmonary effort is normal. No respiratory distress.     Breath sounds: Examination of the right-lower field reveals decreased breath sounds. Examination of the left-lower field reveals decreased breath sounds. Decreased breath sounds present. No wheezing or rales.     Comments: Respirations equal and unlabored, patient able to speak in full sentences, lungs clear to auscultation bilaterally but with decreased lung sounds in bilateral bases. Chest:     Chest wall: No tenderness.  Abdominal:     General: Bowel sounds are normal. There is no distension.     Palpations: Abdomen is soft. There is no mass.     Tenderness: There is no abdominal tenderness. There is no guarding.      Comments: Abdomen soft, nondistended, nontender  to palpation in all quadrants without guarding or peritoneal signs  Musculoskeletal:        General: No deformity.     Right lower leg: Edema present.     Left lower leg: Edema present.     Comments: 3+ pitting edema in bilateral lower extremities, warm and well perfused with good pulses.  Skin:    General: Skin is warm and dry.     Capillary Refill: Capillary refill takes less than 2 seconds.  Neurological:     Mental Status: He is alert.     Coordination: Coordination normal.     Comments: Speech is clear, able to follow commands Moves extremities without ataxia, coordination intact  Psychiatric:        Mood and Affect: Mood normal.        Behavior: Behavior normal.      ED Treatments / Results  Labs (all labs ordered are listed, but only abnormal results are displayed) Labs Reviewed  COMPREHENSIVE METABOLIC PANEL - Abnormal; Notable for the following components:      Result Value   Sodium 131 (*)    Glucose, Bld 122 (*)    BUN 33 (*)    Creatinine, Ser 1.64 (*)    Calcium 8.7 (*)    AST 43 (*)    Alkaline Phosphatase 173 (*)    Total Bilirubin 2.7 (*)    GFR calc non Af Amer 41 (*)    GFR calc Af Amer 48 (*)    All other components within normal limits  CBC WITH DIFFERENTIAL/PLATELET - Abnormal; Notable for the following components:   RDW 19.2 (*)    Monocytes Absolute 1.2 (*)    All other components within normal limits  BRAIN NATRIURETIC PEPTIDE - Abnormal; Notable for the following components:   B Natriuretic Peptide 1,718.0 (*)    All other components within normal limits  SARS CORONAVIRUS 2  BASIC METABOLIC PANEL  BILIRUBIN, FRACTIONATED(TOT/DIR/INDIR)    EKG EKG Interpretation  Date/Time:  Tuesday November 11 2018 18:24:59 EDT Ventricular Rate:  68 PR Interval:    QRS Duration: 167 QT Interval:  476 QTC Calculation: 507 R Axis:   -97 Text Interpretation:  Ventricular-paced rhythm No further analysis  attempted due to paced rhythm unchanged from prior V-paced tracings Confirmed by Daleen Bo [phone]) on 11/11/2018 6:31:53 PM   Radiology Dg Chest 1 View  Result Date: 11/11/2018 CLINICAL DATA:  Shortness of breath. EXAM: CHEST  1 VIEW COMPARISON:  10/06/2018 FINDINGS: Stable position of biventricular pacemaker. The cardiac silhouette is enlarged. Mediastinal contours appear intact. There is no evidence of focal airspace consolidation, pleural effusion or pneumothorax. Osseous structures are without acute abnormality. Soft tissues are grossly normal. IMPRESSION: 1. Enlarged cardiac silhouette. 2. No evidence of pulmonary edema or focal airspace consolidation. Electronically Signed   By: Fidela Salisbury M.D.   On: 11/11/2018 18:57    Procedures Procedures (including critical care time)  Medications Ordered in ED Medications  furosemide (LASIX) injection 60 mg (60 mg Intravenous Given 11/11/18 1934)     Initial Impression / Assessment and Plan / ED Course  I have reviewed the triage vital signs and the nursing notes.  Pertinent labs & imaging results that were available during my care of the patient were reviewed by me and considered in my medical decision making (see chart for details).  73 year old male with history of CHF presents with 4 days of increasing shortness of breath and lower extremity edema with 16 pound weight gain  despite being compliant with diuretics at home.  On arrival vitals normal, patient not hypoxic.  But has some decreased lung sounds in the bases and significant lower extremity edema.  No associated chest pain.  Will check basic labs, EKG and BNP.  Given the patient has no chest pain and EKG shows paced rhythm without ischemic changes, doubt ACS so will hold off on ordering high-sensitivity troponin, per Dr. Beatriz Chancellor recommendation.  Labs show no leukocytosis and normal hemoglobin.  Sodium of 131, creatinine of 1.64, typically ranges between 1.3-1.6, fortunately  no hypokalemia in the setting of diuretics.  BNP is significantly elevated at 1718.0 supportive of CHF exacerbation.  Chest x-ray shows enlarged cardiac silhouette without evidence of pulmonary edema fortunately.  Patient given 60 of IV Lasix and will need admission for acute on chronic CHF.  Hospitalist consulted.  Case discussed with Dr. Myna Hidalgo with [facility] hospitalist who will see and admit the patient.  Patient discussed with Dr. Eulis Foster, who saw patient as well and agrees with plan.   Final Clinical Impressions(s) / ED Diagnoses   Final diagnoses:  Acute on chronic congestive heart failure, unspecified heart failure type [facility])    ED Discharge Orders    None       Janet Berlin 11/11/18 2137    Daleen Bo, MD 11/11/18 2317

## 2018-11-11 NOTE — H&P (Signed)
History and Physical    John [name] YQI:[id number] DOB: 1945/09/14 DOA: 11/11/2018  PCP: Mikey Kirschner, MD   Patient coming from: Home   Chief Complaint: SOB, wt gain, orthopnea, increased swelling   HPI: John [name] is a 73 y.o. male with medical history significant for atrial flutter on Eliquis, complete heart block with pacer, insulin-dependent diabetes mellitus, chronic systolic CHF, and chronic kidney disease stage III, now presenting to the emergency department for evaluation of exertional dyspnea, weight gain, increased leg swelling, and orthopnea.  Patient reports progressive worsening in bilateral lower extremity edema, orthopnea, and exertional dyspnea over the past few days.  He was seen by home health PT today who noted a 14 pound weight gain over the past 4 days, noted that the patient had reduced exercise tolerance, and this was discussed with the patient's cardiology office who advised that he present to the ED.  He had been out of his torsemide for a few days approximately 3 weeks ago, and had been on increased potassium due to hypokalemia, but otherwise no changes in his medications and no missed doses.  He denies any chest pain.  Denies fevers.  Denies cough.   ED Course: Upon arrival to the ED, patient is found to be afebrile, saturating low to mid 90s on room air at rest, and with stable blood pressure.  EKG features a ventricular paced rhythm.  Chest x-ray official read pending but with apparent cardiomegaly, vascular congestion, and increased vascular pedicle width, no conspicuous pneumothorax or consolidation.  Chemistry panel features a sodium of 131, total bilirubin 2.7, and creatinine 1.64, up from 1.37 earlier this month.  CBC is unremarkable.  BNP is elevated to 1718.  COVID-19 screening test is in process.  Patient was given 60 mg IV Lasix in the ED and hospitalists consulted for admission.  Review of Systems:  All other systems reviewed and apart from HPI,  are negative.  Past Medical History:  Diagnosis Date  . Atrial flutter ([facility] [facility]) 12/2010   Admitted with symptomatic bradycardia (HR 40s), atrial flutter with slow ventricular response 12/2010 + volume overload; AV nodal agents d/c'd and Pradaxa started; RFA in 01/2011  . CHF (congestive heart failure) ([place])   . Chronic combined systolic and diastolic heart failure (HCC)    echo 01/06/11: mild LVH, EF 65-70%, mod to severe LAE, mild RVE, mild RAE, PASP 32;   TEE 10/12: EF 45-50%   . Class 2 severe obesity due to excess calories with serious comorbidity and body mass index (BMI) of 37.0 to 37.9 in adult ([place]) 07/17/2017  . Diabetes mellitus    non insulin dependant  . Hyperlipidemia   . Hypertension   . Osteoarthritis   . Presence of permanent cardiac pacemaker   . PSVT (paroxysmal supraventricular tachycardia) (HCC)    Possibly atrial flutter    Past Surgical History:  Procedure Laterality Date  . ATRIAL FLUTTER ABLATION N/A 02/07/2011   Procedure: ATRIAL FLUTTER ABLATION;  Surgeon: Evans Lance, MD;  Location: [facility] CATH LAB;  Service: Cardiovascular;  Laterality: N/A;  . CARDIAC ELECTROPHYSIOLOGY STUDY AND ABLATION  02/07/11  . CARDIOVERSION N/A 03/23/2016   Procedure: CARDIOVERSION;  Surgeon: Evans Lance, MD;  Location: [place];  Service: Cardiovascular;  Laterality: N/A;  . COLONOSCOPY N/A 04/17/2017   Procedure: COLONOSCOPY;  Surgeon: Rogene Houston, MD;  Location[facility];  Service: Endoscopy;  Laterality: N/A;  830  . EP IMPLANTABLE DEVICE N/A 08/29/2015   Procedure: Pacemaker Implant;  Surgeon: Evans Lance, MD;  Location: [place] CV LAB;  Service: Cardiovascular;  Laterality: N/A;  . EP IMPLANTABLE DEVICE N/A 12/07/2015   Procedure: PPM Lead Revision/Repair;  Surgeon: Evans Lance, MD;  Location: [place] CV LAB;  Service: Cardiovascular;  Laterality: N/A;  . KNEE ARTHROSCOPY  2005   left  . LUMBAR SPINE SURGERY     "I've had 6 ORs 1972 thru 2004"  .  POLYPECTOMY  04/17/2017   Procedure: POLYPECTOMY;  Surgeon: Rogene Houston, MD;  [facility];  Service: Endoscopy;;  transverse colon x3;     reports that he quit smoking about 32 years ago. His smoking use included cigarettes. He has a 10.00 pack-year smoking history. He has never used smokeless tobacco. He reports that he does not drink alcohol or use drugs.  No Known Allergies  Family History  Problem Relation Age of Onset  . Heart attack Mother   . Hypertension Mother   . Heart attack Father   . Hypertension Father   . Heart attack Brother   . Colon cancer Neg Hx      Prior to Admission medications   Medication Sig Start Date End Date Taking? Authorizing Provider  acetaminophen (TYLENOL) 500 MG tablet Take 1,000 mg by mouth every 6 (six) hours as needed for moderate pain (thigh pain).    [provider]  amLODipine (NORVASC) 10 MG tablet Take 1 tablet (10 mg total) by mouth daily. One daily 06/30/18   Mikey Kirschner, MD  benzonatate (TESSALON) 100 MG capsule TAKE 1 CAPSULE (100 MG TOTAL) BY MOUTH EVERY 6 (SIX) HOURS AS NEEDED FOR COUGH. 09/09/18 09/09/19  Lendon Colonel, NP  carvedilol (COREG) 25 MG tablet TAKE 1 TABLET (25 MG TOTAL) BY MOUTH 2 (TWO) TIMES DAILY WITH A MEAL. 09/18/18   Herminio Commons, MD  Dulaglutide (TRULICITY) 1.5 IW/9.7LG SOPN Inject 1.5 mg into the skin once a week. 10/09/18   [place] Anger, MD  ELIQUIS 5 MG TABS tablet TAKE 1 TABLET TWICE DAILY Patient taking differently: Take 5 mg by mouth 2 (two) times daily.  05/14/18   Evans Lance, MD  glucose blood test strip 1 each by Other route as needed. Use as instructed bid 07/23/17   Cass[place]ria Anger, MD  HYDROcodone-acetaminophen (NORCO/VICODIN) 5-325 MG tablet Take one tablet po every 4 hrs prn pain 10/10/18   Mikey Kirschner, MD  Insulin Pen Needle 31G X 8 MM MISC USE EVERY DAY AS DIRECTED BY PHYSICIAN 03/20/18   Cass[place]ria Anger, MD  Magnesium 400 MG CAPS Take  400 mg by mouth daily.     [provider]  metFORMIN (GLUCOPHAGE) 500 MG tablet Take 1 tablet (500 mg total) by mouth 2 (two) times daily with a meal. 09/19/18   Nida, Marella Chimes, MD  Multiple Vitamins-Minerals (MULTIVITAMIN WITH MINERALS) tablet Take 1 tablet by mouth daily.    [provider]  potassium chloride SA (KLOR-CON M20) 20 MEQ tablet 10/30/2018 Take 80 meq (4 tablets) every day 11/06/18   Abigail Butts., PA-C  Resveratrol 100 MG CAPS Take 100 mg by mouth at bedtime.     [provider]  rosuvastatin (CRESTOR) 20 MG tablet Take 1 tablet (20 mg total) by mouth daily. 06/30/18   Mikey Kirschner, MD  torsemide (DEMADEX) 20 MG tablet Take 3 tablets (60 mg total) by mouth 2 (two) times daily. 10/25/18   Cheryln Manly, NP  TRESIBA FLEXTOUCH 200  UNIT/ML SOPN INJECT 70 UNITS SUBCUTANEOUSLY AT BEDTIME 10/16/18   Cass[place]ria Anger, MD    Physical Exam: Vitals:   11/11/18 1845 11/11/18 1900 11/11/18 1943 11/11/18 2000  BP:    109/64  Pulse: 60 (!) 59 65 (!) 58  Resp: 20 15 (!) 29 17  Temp:      TempSrc:      SpO2: 99% 97% 99% 97%  Weight:      Height:        Constitutional: NAD, calm  Eyes: PERTLA, lids and conjunctivae normal ENMT: Mucous membranes are moist. Posterior pharynx clear of any exudate or lesions.   Neck: normal, supple, no masses, no thyromegaly Respiratory:  no wheezing, no crackles. No accessory muscle use.  Cardiovascular: S1 & S2 heard, regular rate and rhythm. Pretibial pitting edema bilaterally. Abdomen: No distension, no tenderness, soft. Bowel sounds normal.  Musculoskeletal: no clubbing / cyanosis. No joint deformity upper and lower extremities.   Skin: no significant rashes, lesions, ulcers. Warm, dry, well-perfused. Neurologic: No facial asymmetry. Sensation intact. Moving all extremities.  Psychiatric:  Alert and oriented x 3. Very pleasant, cooperative.    Labs on Admission: I have personally reviewed following labs  and imaging studies  CBC: Recent Labs  Lab 11/11/18 1812  WBC 8.7  NEUTROABS 5.5  HGB 13.0  HCT 39.6  MCV 86.5  PLT 631   Basic Metabolic Panel: Recent Labs  Lab 11/05/18 1107 11/11/18 1812  NA 135 131*  K 3.6 4.8  CL 95* 101  CO2 27 22  GLUCOSE 112* 122*  BUN 34* 33*  CREATININE 1.37* 1.64*  CALCIUM 9.1 8.7*   GFR: Estimated Creatinine Clearance: 54.2 mL/min (A) (by C-G formula based on SCr of 1.64 mg/dL (H)). Liver Function Tests: Recent Labs  Lab 11/11/18 1812  AST 43*  ALT 29  ALKPHOS 173*  BILITOT 2.7*  PROT 8.1  ALBUMIN 3.6   No results for input(s): LIPASE, AMYLASE in the last 168 hours. No results for input(s): AMMONIA in the last 168 hours. Coagulation Profile: No results for input(s): INR, PROTIME in the last 168 hours. Cardiac Enzymes: No results for input(s): CKTOTAL, CKMB, CKMBINDEX, TROPONINI in the last 168 hours. BNP (last 3 results) No results for input(s): PROBNP in the last 8760 hours. HbA1C: No results for input(s): HGBA1C in the last 72 hours. CBG: No results for input(s): GLUCAP in the last 168 hours. Lipid Profile: No results for input(s): CHOL, HDL, LDLCALC, TRIG, CHOLHDL, LDLDIRECT in the last 72 hours. Thyroid Function Tests: No results for input(s): TSH, T4TOTAL, FREET4, T3FREE, THYROIDAB in the last 72 hours. Anemia Panel: No results for input(s): VITAMINB12, FOLATE, FERRITIN, TIBC, IRON, RETICCTPCT in the last 72 hours. Urine analysis:    Component Value Date/Time   COLORURINE YELLOW 08/19/2018 1120   APPEARANCEUR CLEAR 08/19/2018 1120   LABSPEC 1.014 08/19/2018 1120   PHURINE 5.0 08/19/2018 1120   GLUCOSEU NEGATIVE 08/19/2018 1120   HGBUR NEGATIVE 08/19/2018 [place] 08/19/2018 [place] 08/19/2018 1120   PROTEINUR NEGATIVE 08/19/2018 1120   UROBILINOGEN 0.2 01/05/2014 0744   NITRITE NEGATIVE 08/19/2018 1120   LEUKOCYTESUR NEGATIVE 08/19/2018 1120   Sepsis Labs:  @[email or web address] (procalcitonin:4,lacticidven:4) )No results found for this or any previous visit (from the past 240 hour(s)).   Radiological Exams on Admission: No results found.  EKG: Independently reviewed. Ventricular-paced rhythm.   Assessment/Plan   1. Acute on chronic combined systolic & diastolic CHF  - Presents with 4 days  of progressive wt-gain, edema, DOE, and orthopnea  - EF was 45-50% in May 6294 with diastolic dysfunction, global LV hypokinesis, and mild AS  - He was treated with Lasix 60 mg IV in ED  - Continue diuresis with Lasix 60 mg IV q12h, continue Coreg, follow daily wt and I/O's, monitor renal function and electrolytes during diuresis    2. CKD stage III  - SCr is 1.64 on admission, up from 1.37 earlier this month  - Renally-dose medications, monitor with daily chem panel during IV diuresis    3. Atrial fibrillation  - In a paced rhythm on admission  - CHADS-VASc is at least 4 (age, CHF, DM, HTN) - Continue Eliquis and beta-blocker   4. Hyponatremia  - Serum sodium is 131 on admission in setting of hypervolemia  - Monitor with diuresis    5. Hyperbilirubinemia - Total bilirubin is 2.7 on admission  - There is no abd pain and levels have been elevated previously - Fatty liver noted on imaging from 2006   - Possibly congestive hepatopathy, will fractionate   6. Hypertension  - BP at goal, continue Norvasc and Coreg  7. Insulin-dependent DM  - A1c was 7.8% in June 2020  - Managed at home with Trulicity, metformin, and Tresiba 70 units qHS  - Will use Lantus with Novolog correctional while in hospital    PPE: Mask, face shield. Patient wearing mask.  DVT prophylaxis: Eliquis  Code Status: Full  Family Communication: Wife updated at bedside  Consults called: None Admission status: Observation    Vianne Bulls, MD [facility] Pager [phone]  If 7PM-7AM, please contact night-coverage [email or web address] Password [facility]  11/11/2018, 8:36 PM

## 2018-11-12 ENCOUNTER — Encounter: Payer: Self-pay | Admitting: Medical

## 2018-11-12 DIAGNOSIS — Z87891 Personal history of nicotine dependence: Secondary | ICD-10-CM | POA: Diagnosis not present

## 2018-11-12 DIAGNOSIS — E782 Mixed hyperlipidemia: Secondary | ICD-10-CM | POA: Diagnosis present

## 2018-11-12 DIAGNOSIS — I442 Atrioventricular block, complete: Secondary | ICD-10-CM | POA: Diagnosis present

## 2018-11-12 DIAGNOSIS — I10 Essential (primary) hypertension: Secondary | ICD-10-CM | POA: Diagnosis not present

## 2018-11-12 DIAGNOSIS — N179 Acute kidney failure, unspecified: Secondary | ICD-10-CM | POA: Diagnosis present

## 2018-11-12 DIAGNOSIS — I5043 Acute on chronic combined systolic (congestive) and diastolic (congestive) heart failure: Secondary | ICD-10-CM | POA: Diagnosis present

## 2018-11-12 DIAGNOSIS — E1122 Type 2 diabetes mellitus with diabetic chronic kidney disease: Secondary | ICD-10-CM | POA: Diagnosis present

## 2018-11-12 DIAGNOSIS — Z8249 Family history of ischemic heart disease and other diseases of the circulatory system: Secondary | ICD-10-CM | POA: Diagnosis not present

## 2018-11-12 DIAGNOSIS — Z950 Presence of cardiac pacemaker: Secondary | ICD-10-CM | POA: Diagnosis not present

## 2018-11-12 DIAGNOSIS — E871 Hypo-osmolality and hyponatremia: Secondary | ICD-10-CM | POA: Diagnosis present

## 2018-11-12 DIAGNOSIS — I471 Supraventricular tachycardia: Secondary | ICD-10-CM | POA: Diagnosis present

## 2018-11-12 DIAGNOSIS — I509 Heart failure, unspecified: Secondary | ICD-10-CM | POA: Diagnosis present

## 2018-11-12 DIAGNOSIS — I350 Nonrheumatic aortic (valve) stenosis: Secondary | ICD-10-CM | POA: Diagnosis present

## 2018-11-12 DIAGNOSIS — I4892 Unspecified atrial flutter: Secondary | ICD-10-CM | POA: Diagnosis present

## 2018-11-12 DIAGNOSIS — E1159 Type 2 diabetes mellitus with other circulatory complications: Secondary | ICD-10-CM | POA: Diagnosis present

## 2018-11-12 DIAGNOSIS — E876 Hypokalemia: Secondary | ICD-10-CM | POA: Diagnosis not present

## 2018-11-12 DIAGNOSIS — I480 Paroxysmal atrial fibrillation: Secondary | ICD-10-CM | POA: Diagnosis not present

## 2018-11-12 DIAGNOSIS — N183 Chronic kidney disease, stage 3 (moderate): Secondary | ICD-10-CM | POA: Diagnosis present

## 2018-11-12 DIAGNOSIS — E785 Hyperlipidemia, unspecified: Secondary | ICD-10-CM | POA: Diagnosis present

## 2018-11-12 DIAGNOSIS — I130 Hypertensive heart and chronic kidney disease with heart failure and stage 1 through stage 4 chronic kidney disease, or unspecified chronic kidney disease: Secondary | ICD-10-CM | POA: Diagnosis present

## 2018-11-12 DIAGNOSIS — I4819 Other persistent atrial fibrillation: Secondary | ICD-10-CM | POA: Diagnosis present

## 2018-11-12 DIAGNOSIS — Z794 Long term (current) use of insulin: Secondary | ICD-10-CM | POA: Diagnosis not present

## 2018-11-12 DIAGNOSIS — Z20828 Contact with and (suspected) exposure to other viral communicable diseases: Secondary | ICD-10-CM | POA: Diagnosis present

## 2018-11-12 DIAGNOSIS — M1990 Unspecified osteoarthritis, unspecified site: Secondary | ICD-10-CM | POA: Diagnosis present

## 2018-11-12 LAB — GLUCOSE, CAPILLARY
Glucose-Capillary: 106 mg/dL — ABNORMAL HIGH (ref 70–99)
Glucose-Capillary: 59 mg/dL — ABNORMAL LOW (ref 70–99)
Glucose-Capillary: 136 mg/dL — ABNORMAL HIGH (ref 70–99)
Glucose-Capillary: 221 mg/dL — ABNORMAL HIGH (ref 70–99)
Glucose-Capillary: 173 mg/dL — ABNORMAL HIGH (ref 70–99)

## 2018-11-12 LAB — BILIRUBIN, FRACTIONATED(TOT/DIR/INDIR)
Bilirubin, Direct: 0.8 mg/dL — ABNORMAL HIGH (ref 0.0–0.2)
Indirect Bilirubin: 1.4 mg/dL — ABNORMAL HIGH (ref 0.3–0.9)
Total Bilirubin: 2.2 mg/dL — ABNORMAL HIGH (ref 0.3–1.2)

## 2018-11-12 LAB — BASIC METABOLIC PANEL
Anion gap: 16 — ABNORMAL HIGH (ref 5–15)
BUN: 32 mg/dL — ABNORMAL HIGH (ref 8–23)
CO2: 15 mmol/L — ABNORMAL LOW (ref 22–32)
Calcium: 8.3 mg/dL — ABNORMAL LOW (ref 8.9–10.3)
Chloride: 103 mmol/L (ref 98–111)
Creatinine, Ser: 1.59 mg/dL — ABNORMAL HIGH (ref 0.61–1.24)
GFR calc Af Amer: 50 mL/min — ABNORMAL LOW (ref >60)
GFR calc non Af Amer: 43 mL/min — ABNORMAL LOW (ref >60)
Glucose, Bld: 95 mg/dL (ref 70–99)
Potassium: 4.3 mmol/L (ref 3.5–5.1)
Sodium: 134 mmol/L — ABNORMAL LOW (ref 135–145)

## 2018-11-12 LAB — SARS CORONAVIRUS 2 (TAT 6-24 HRS): SARS Coronavirus 2: NEGATIVE

## 2018-11-12 MED ORDER — FUROSEMIDE 10 MG/ML IJ SOLN
60.0000 mg | Freq: Two times a day (BID) | INTRAMUSCULAR | Status: DC
  Administered 2018-11-12 – 2018-11-13 (×4): 60 mg via INTRAVENOUS
  Filled 2018-11-12 (×4): qty 6

## 2018-11-12 NOTE — Progress Notes (Signed)
Patient CBG 59. Patient offered 4 ozs of regular soda and breakfast. Patient asymptomatic. Will recheck CBG and continue to monitor patient.

## 2018-11-12 NOTE — Progress Notes (Signed)
PROGRESS NOTE    John [name]  IEP:[id number] DOB: 1946/03/17 DOA: 11/11/2018 PCP: Mikey Kirschner, MD     Brief Narrative:  73 y.o. male with medical history significant for atrial flutter on Eliquis, complete heart block with pacer, insulin-dependent diabetes mellitus, chronic systolic CHF, and chronic kidney disease stage III, now presenting to the emergency department for evaluation of exertional dyspnea, weight gain, increased leg swelling, and orthopnea.  Patient reports progressive worsening in bilateral lower extremity edema, orthopnea, and exertional dyspnea over the past few days.  He was seen by home health PT today who noted a 14 pound weight gain over the past 4 days, noted that the patient had reduced exercise tolerance, and this was discussed with the patient's cardiology office who advised that he present to the ED.  He had been out of his torsemide for a few days approximately 3 weeks ago, and had been on increased potassium due to hypokalemia, but otherwise no changes in his medications and no missed doses.  He denies any chest pain. Denies fevers.  Denies cough.   ED Course: Upon arrival to the ED, patient is found to be afebrile, saturating low to mid 90s on room air at rest, and with stable blood pressure.  EKG features a ventricular paced rhythm.  Chest x-ray official read pending but with apparent cardiomegaly, vascular congestion, and increased vascular pedicle width, no conspicuous pneumothorax or consolidation.  Chemistry panel features a sodium of 131, total bilirubin 2.7, and creatinine 1.64, up from 1.37 earlier this month.  CBC is unremarkable.  BNP is elevated to 1718.  COVID-19 screening test is in process.  Patient was given 60 mg IV Lasix in the ED and hospitalists consulted for admission   Assessment & Plan: 1-acute on chronic systolic CHF (congestive heart failure) ([place]) -With concern for diet/medication noncompliance -Still with signs of fluid overload and  complaining of orthopnea -Follow daily weights, strict I's and O's and low-sodium diet -Continue IV diuresis -Cardiology has been consulted for further recommendations given recent medication adjustment and need of metolazone in the past. -Continue to follow electrolytes and renal function closely.  2-acute on chronic stage III renal failure -Creatinine on admission 1.6 baseline around 1.3 -Appears to be secondary to acute CHF and decreased perfusion -After diuresis has been initiated creatinine level trending down and currently 1.54 -Continue to monitor renal function trend -Continue IV diuresis.  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  5-atrial fibrillation; chronic -Patient is status post pacemaker implantation -CHADsVASC score 4 -Continue beta-blocker and Eliquis  6-per natremia -Serum sodium 131 on admission -Appears to be secondary to hypervolemia -Continue IV diuresis -Monitor sodium trend; currently 134  7-class II obesity -Body mass index is 38.1 kg/m. -Low calorie diet, portion control and increase physical activity has been discussed with patient.   DVT prophylaxis: Eliquis Code Status: Full code Family Communication: No family at bedside. Disposition Plan: Continue IV diuresis, follow cardiology service recommendations, daily weights, strict I's and O's, low-sodium diet.  Close monitoring of electrolytes and renal function.  Consultants:   Cardiology service.  Procedures:   See below for x-ray reports.  Antimicrobials:  Anti-infectives (From admission, onward)   None       Subjective: Afebrile, no chest pain, no nausea, no vomiting.  Still with significant signs of fluid overload and reporting orthopnea.  Objective: Vitals:    11/12/18 0500 11/12/18 0524 11/12/18 0832 11/12/18 1008  BP:  (!) 96/57  104/69  Pulse:  (!) 56  64  Resp:  18    Temp:  98.4 F (36.9 C)    TempSrc:  Oral    SpO2:  96% 97%   Weight: 123.9 kg     Height:        Intake/Output Summary (Last 24 hours) at 11/12/2018 1336 Last data filed at 11/12/2018 1016 Gross per 24 hour  Intake 480 ml  Output 1100 ml  Net -620 ml   Filed Weights   11/11/18 1806 11/11/18 2144 11/12/18 0500  Weight: 122.5 kg 123.7 kg 123.9 kg    Examination: General exam: Alert, awake, oriented x 3; denies chest pain, no nausea, no vomiting.  Still with signs of fluid overload and reporting mild orthopnea. Respiratory system: Fine crackles at the bases; normal respiratory effort, no wheezing. Cardiovascular system: S1 and S2; no murmurs, rubs, gallops.  Unable to assess JVD due to body habitus. Gastrointestinal system: Abdomen is obese, nondistended, soft and nontender. No organomegaly or masses felt. Normal bowel sounds heard. Central nervous system: Alert and oriented. No focal neurological deficits. Extremities: No cyanosis or clubbing.  3+ edema bilaterally. Skin: No rashes, lesions or ulcers Psychiatry: Judgement and insight appear normal. Mood & affect appropriate.     Data Reviewed: I have personally reviewed following labs and imaging studies  CBC: Recent Labs  Lab 11/11/18 1812  WBC 8.7  NEUTROABS 5.5  HGB 13.0  HCT 39.6  MCV 86.5  PLT 109   Basic Metabolic Panel: Recent Labs  Lab 11/11/18 1812 11/12/18 0504  NA 131* 134*  K 4.8 4.3  CL 101 103  CO2 22 15*  GLUCOSE 122* 95  BUN 33* 32*  CREATININE 1.64* 1.59*  CALCIUM 8.7* 8.3*   GFR: Estimated Creatinine Clearance: 56.3 mL/min (A) (by C-G formula based on SCr of 1.59 mg/dL (H)).   Liver Function Tests: Recent Labs  Lab 11/11/18 1812 11/12/18 0504  AST 43*  --   ALT 29  --   ALKPHOS 173*  --   BILITOT 2.7* 2.2*  PROT 8.1  --   ALBUMIN 3.6  --    CBG: Recent Labs   Lab 11/11/18 2147 11/12/18 0744 11/12/18 0828 11/12/18 1136  GLUCAP 105* 59* 106* 136*   Urine analysis:    Component Value Date/Time   COLORURINE YELLOW 08/19/2018 1120   APPEARANCEUR CLEAR 08/19/2018 1120   LABSPEC 1.014 08/19/2018 1120   PHURINE 5.0 08/19/2018 1120   GLUCOSEU NEGATIVE 08/19/2018 1120   HGBUR NEGATIVE 08/19/2018 1120   BILIRUBINUR NEGATIVE 08/19/2018 1120   KETONESUR NEGATIVE 08/19/2018 1120   PROTEINUR NEGATIVE 08/19/2018 1120   UROBILINOGEN 0.2 01/05/2014 0744   NITRITE NEGATIVE 08/19/2018 1120   LEUKOCYTESUR NEGATIVE 08/19/2018 1120    Recent Results (from the past 240 hour(s))  SARS CORONAVIRUS 2 Nasal Swab Aptima Multi Swab     Status: None   Collection Time: 11/11/18  6:23 PM   Specimen: Aptima Multi Swab; Nasal Swab  Result Value Ref Range Status   SARS Coronavirus 2 NEGATIVE NEGATIVE Final    Comment: (NOTE) SARS-CoV-2 target nucleic acids are NOT DETECTED. The SARS-CoV-2 RNA is generally detectable in upper and lower respiratory specimens during the acute phase of infection. Negative results do not preclude SARS-CoV-2 infection, do not rule out co-infections with other pathogens, and should not be used as the sole basis for treatment or  other patient management decisions. Negative results must be combined with clinical observations, patient history, and epidemiological information. The expected result is Negative. Fact Sheet for Patients: SugarRoll.be Fact Sheet for Healthcare Providers: https://www.woods-mathews.com/ This test is not yet approved or cleared by the [place] FDA and  has been authorized for detection and/or diagnosis of SARS-CoV-2 by FDA under an Emergency Use Authorization (EUA). This EUA will remain  in effect (meaning this test can be used) for the duration of the COVID-19 declaration under Section 56 4(b)(1) of the Act, 21 U.S.C. section 360bbb-3(b)(1), unless the  authorization is terminated or revoked sooner. Performed at [facility], [place] [address], [place], [place] [address]      Radiology Studies: Dg Chest 1 View  Result Date: 11/11/2018 CLINICAL DATA:  Shortness of breath. EXAM: CHEST  1 VIEW COMPARISON:  10/06/2018 FINDINGS: Stable position of biventricular pacemaker. The cardiac silhouette is enlarged. Mediastinal contours appear intact. There is no evidence of focal airspace consolidation, pleural effusion or pneumothorax. Osseous structures are without acute abnormality. Soft tissues are grossly normal. IMPRESSION: 1. Enlarged cardiac silhouette. 2. No evidence of pulmonary edema or focal airspace consolidation. Electronically Signed   By: Fidela Salisbury M.D.   On: 11/11/2018 18:57    Scheduled Meds: . amLODipine  10 mg Oral Daily  . apixaban  5 mg Oral BID  . carvedilol  25 mg Oral BID WC  . furosemide  60 mg Intravenous Q12H  . insulin aspart  0-5 Units Subcutaneous QHS  . insulin aspart  0-9 Units Subcutaneous TID WC  . insulin glargine  40 Units Subcutaneous QHS  . magnesium oxide  400 mg Oral Daily  . rosuvastatin  20 mg Oral q1800  . sodium chloride flush  3 mL Intravenous Q12H   Continuous Infusions: . sodium chloride       LOS: 0 days    Time spent: 30 minutes.   Barton Dubois, MD [facility] Pager [phone]  11/12/2018, 1:36 PM

## 2018-11-12 NOTE — TOC Initial Note (Signed)
Transition of Care [facility]) - Initial/Assessment Note    Patient Details  Name: John [name] MRN: [id number] Date of Birth: 05/16/1945  Transition of Care [facility]) CM/SW Contact:    Ihor Gully, LCSW Phone Number: 11/12/2018, 4:27 PM  Clinical Narrative:                 Patient reports that he takes daily weights. He is independent at baseline and drives. Patient ambulates independently. Patient maintains PCP and specialist appointments and has no issues obtaining his medications.  TOC Will follow through discharge and address needs that arise.    Expected Discharge Plan: Home/Self Care Barriers to Discharge: No Barriers Identified   Patient Goals and CMS Choice        Expected Discharge Plan and Services Expected Discharge Plan: Home/Self Care                                              Prior Living Arrangements/Services   Lives with:: Spouse Patient language and need for interpreter reviewed:: Yes Do you feel safe going back to the place where you live?: Yes      Need for Family Participation in Patient Care: Yes (Comment) Care giver support system in place?: Yes (comment)   Criminal Activity/Legal Involvement Pertinent to Current Situation/Hospitalization: No - Comment as needed  Activities of Daily Living Home Assistive Devices/Equipment: Eyeglasses, CBG Meter ADL Screening (condition at time of admission) Patient's cognitive ability adequate to safely complete daily activities?: Yes Is the patient deaf or have difficulty hearing?: No Does the patient have difficulty seeing, even when wearing glasses/contacts?: No Does the patient have difficulty concentrating, remembering, or making decisions?: No Patient able to express need for assistance with ADLs?: Yes Does the patient have difficulty dressing or bathing?: No Independently performs ADLs?: Yes (appropriate for developmental age) Does the patient have difficulty walking or climbing stairs?:  No Weakness of Legs: None Weakness of Arms/Hands: None  Permission Sought/Granted Permission sought to share information with : PCP                Emotional Assessment Appearance:: Appears stated age   Affect (typically observed): Accepting, Calm Orientation: : Oriented to Self, Oriented to Place, Oriented to  Time, Oriented to Situation Alcohol / Substance Use: Not Applicable Psych Involvement: No (comment)  Admission diagnosis:  Acute on chronic congestive heart failure, unspecified heart failure type [facility]) [I50.9] Patient Active Problem List   Diagnosis Date Noted  . Acute on chronic combined systolic and diastolic HF (heart failure) ([place]) 11/12/2018  . Hyponatremia 11/11/2018  . Hyperbilirubinemia 11/11/2018  . CHF (congestive heart failure) ([place]) 08/19/2018  . Stage 3 chronic kidney disease ([place])   . Paroxysmal atrial fibrillation (HCC)   . Acute on chronic systolic CHF (congestive heart failure) ([place]) 03/02/2018  . Pacemaker 03/02/2018  . History of colonic polyps 12/28/2016  . Atrial pacemaker lead displacement 12/07/2015  . Acute CHF (congestive heart failure) ([place]) 11/21/2015  . CHF, acute on chronic ([place]) 11/20/2015  . Complete heart block ([place]) 11/20/2015  . Chronic diastolic CHF (congestive heart failure) ([place]) 08/28/2015  . Hypertensive heart disease 08/28/2015  . NSVT (nonsustained ventricular tachycardia) ([place]) 08/28/2015  . Elevated troponin 08/28/2015  . Second degree heart block 08/25/2015  . Bradycardia 08/25/2015  . Acute diastolic CHF (congestive heart failure) ([place]) 08/25/2015  . Current use of long  term anticoagulation 05/04/2011  . Atrial flutter ([place]) 01/05/2011  . DM type 2 causing vascular disease ([place]) 12/23/2009  . Mixed hyperlipidemia 12/23/2009  . Essential hypertension 12/23/2009  . DEGENERATIVE JOINT DISEASE 12/23/2009   PCP:  Mikey Kirschner, MD Pharmacy:   [id number] [id number]: [phone]: [address] [address] [place]Phone: [phone]: [phone]    Social Determinants of Health (SDOH) Interventions    Readmission Risk Interventions No flowsheet data found.

## 2018-11-12 NOTE — Progress Notes (Signed)
Patient CBG now 106.

## 2018-11-12 NOTE — Plan of Care (Signed)
  Problem: Clinical Measurements: Goal: Respiratory complications will improve Outcome: Progressing   Problem: Elimination: Goal: Will not experience complications related to bowel motility Outcome: Progressing Goal: Will not experience complications related to urinary retention Outcome: Progressing   Problem: Safety: Goal: Ability to remain free from injury will improve Outcome: Progressing   Problem: Skin Integrity: Goal: Risk for impaired skin integrity will decrease Outcome: Progressing

## 2018-11-13 ENCOUNTER — Encounter

## 2018-11-13 ENCOUNTER — Ambulatory Visit: Payer: Medicare HMO | Admitting: Student

## 2018-11-13 LAB — BASIC METABOLIC PANEL
Anion gap: 8 (ref 5–15)
BUN: 33 mg/dL — ABNORMAL HIGH (ref 8–23)
CO2: 22 mmol/L (ref 22–32)
Calcium: 8 mg/dL — ABNORMAL LOW (ref 8.9–10.3)
Chloride: 103 mmol/L (ref 98–111)
Creatinine, Ser: 1.55 mg/dL — ABNORMAL HIGH (ref 0.61–1.24)
GFR calc Af Amer: 51 mL/min — ABNORMAL LOW (ref >60)
GFR calc non Af Amer: 44 mL/min — ABNORMAL LOW (ref >60)
Glucose, Bld: 81 mg/dL (ref 70–99)
Potassium: 3.9 mmol/L (ref 3.5–5.1)
Sodium: 133 mmol/L — ABNORMAL LOW (ref 135–145)

## 2018-11-13 LAB — GLUCOSE, CAPILLARY
Glucose-Capillary: 65 mg/dL — ABNORMAL LOW (ref 70–99)
Glucose-Capillary: 140 mg/dL — ABNORMAL HIGH (ref 70–99)
Glucose-Capillary: 162 mg/dL — ABNORMAL HIGH (ref 70–99)
Glucose-Capillary: 170 mg/dL — ABNORMAL HIGH (ref 70–99)
Glucose-Capillary: 212 mg/dL — ABNORMAL HIGH (ref 70–99)

## 2018-11-13 MED ORDER — POTASSIUM CHLORIDE CRYS ER 20 MEQ PO TBCR
20.0000 meq | EXTENDED_RELEASE_TABLET | Freq: Every day | ORAL | Status: DC
  Administered 2018-11-13: 20 meq via ORAL
  Filled 2018-11-13: qty 1

## 2018-11-13 MED ORDER — METOLAZONE 5 MG PO TABS
2.5000 mg | ORAL_TABLET | Freq: Two times a day (BID) | ORAL | Status: DC
  Administered 2018-11-13: 2.5 mg via ORAL
  Filled 2018-11-13: qty 1

## 2018-11-13 NOTE — Progress Notes (Signed)
PROGRESS NOTE    John [name]  XKG:[id number] DOB: November 23, 1945 DOA: 11/11/2018 PCP: Mikey Kirschner, MD     Brief Narrative:  73 y.o. male with medical history significant for atrial flutter on Eliquis, complete heart block with pacer, insulin-dependent diabetes mellitus, chronic systolic CHF, and chronic kidney disease stage III, now presenting to the emergency department for evaluation of exertional dyspnea, weight gain, increased leg swelling, and orthopnea.  Patient reports progressive worsening in bilateral lower extremity edema, orthopnea, and exertional dyspnea over the past few days.  He was seen by home health PT today who noted a 14 pound weight gain over the past 4 days, noted that the patient had reduced exercise tolerance, and this was discussed with the patient's cardiology office who advised that he present to the ED.  He had been out of his torsemide for a few days approximately 3 weeks ago, and had been on increased potassium due to hypokalemia, but otherwise no changes in his medications and no missed doses.  He denies any chest pain. Denies fevers.  Denies cough.   ED Course: Upon arrival to the ED, patient is found to be afebrile, saturating low to mid 90s on room air at rest, and with stable blood pressure.  EKG features a ventricular paced rhythm.  Chest x-ray official read pending but with apparent cardiomegaly, vascular congestion, and increased vascular pedicle width, no conspicuous pneumothorax or consolidation.  Chemistry panel features a sodium of 131, total bilirubin 2.7, and creatinine 1.64, up from 1.37 earlier this month.  CBC is unremarkable.  BNP is elevated to 1718.  COVID-19 screening test is in process.  Patient was given 60 mg IV Lasix in the ED and hospitalists consulted for admission   Assessment & Plan: 1-acute on chronic systolic CHF (congestive heart failure) ([place]) -With concern for diet/medication noncompliance -Still with signs of fluid overload and  complaining of orthopnea -Follow daily weights, strict I's and O's and low-sodium diet -Continue IV diuresis and start the use of metolazone -Cardiology has been consulted for further recommendations given recent medication adjustment and need of metolazone in the past.  Will follow recommendations. -Continue to follow electrolytes and renal function closely.  2-acute on chronic stage III renal failure -Creatinine on admission 1.6 baseline around 1.3 -Appears to be secondary to acute CHF and decreased perfusion -After diuresis has been initiated creatinine level trending down and currently 1.5 -Continue to monitor renal function and electrolytes trend -Continue IV diuresis.  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  5-atrial fibrillation; chronic -Patient is status post pacemaker implantation -CHADsVASC score 4 -Continue beta-blocker and Eliquis  6-per natremia -Serum sodium 131 on admission -Appears to be secondary to hypervolemia -Continue IV diuresis -Monitor sodium trend; currently 134  7-class II obesity -Body mass index is 38.74 kg/m. -Low calorie diet, portion control and increase physical activity has been discussed with patient.   DVT prophylaxis: Eliquis Code Status: Full code Family Communication: No family at bedside. Disposition Plan: Continue IV diuresis, follow cardiology service recommendations, daily weights, strict I's and O's, low-sodium diet.  Close monitoring of electrolytes and renal function.  Consultants:   Cardiology service.  Procedures:   See below for x-ray reports.  Antimicrobials:  Anti-infectives (From admission, onward)   None      Subjective: No fever, no chest pain, no nausea, no  vomiting.  Still with  significant signs of fluid overload on exam, patient reporting orthopnea and PND.  Objective: Vitals:   11/13/18 0603 11/13/18 0747 11/13/18 0949 11/13/18 1412  BP: 97/62 107/62 104/62 107/72  Pulse: (!) 58   70  Resp: 17   19  Temp: 98.2 F (36.8 C)   98.2 F (36.8 C)  TempSrc: Oral   Oral  SpO2: 100%   96%  Weight: 126 kg     Height:        Intake/Output Summary (Last 24 hours) at 11/13/2018 1753 Last data filed at 11/13/2018 1710 Gross per 24 hour  Intake 930 ml  Output 1725 ml  Net -795 ml   Filed Weights   11/11/18 2144 11/12/18 0500 11/13/18 0603  Weight: 123.7 kg 123.9 kg 126 kg    Examination: General exam: Alert, awake, oriented x 3; continues to report orthopnea and PND.  Patient still with signs of fluid overload on physical exam. Respiratory system: Decreased breath sounds at the bases.  Respiratory effort normal. Cardiovascular system: Rate controlled. No murmurs, rubs, gallops. Gastrointestinal system: Abdomen is obese, nondistended, soft and nontender. No organomegaly or masses felt. Normal bowel sounds heard. Central nervous system: Alert and oriented. No focal neurological deficits. Extremities: No cyanosis or clubbing.  3+ edema bilaterally. Skin: No rashes, lesions or ulcers Psychiatry: Judgement and insight appear normal. Mood & affect appropriate.    Data Reviewed: I have personally reviewed following labs and imaging studies  CBC: Recent Labs  Lab 11/11/18 1812  WBC 8.7  NEUTROABS 5.5  HGB 13.0  HCT 39.6  MCV 86.5  PLT 947   Basic Metabolic Panel: Recent Labs  Lab 11/11/18 1812 11/12/18 0504 11/13/18 0537  NA 131* 134* 133*  K 4.8 4.3 3.9  CL 101 103 103  CO2 22 15* 22  GLUCOSE 122* 95 81  BUN 33* 32* 33*  CREATININE 1.64* 1.59* 1.55*  CALCIUM 8.7* 8.3* 8.0*   GFR: Estimated Creatinine Clearance: 58.3 mL/min (A) (by C-G formula based on SCr of 1.55 mg/dL (H)).   Liver Function Tests: Recent Labs  Lab 11/11/18 1812 11/12/18  0504  AST 43*  --   ALT 29  --   ALKPHOS 173*  --   BILITOT 2.7* 2.2*  PROT 8.1  --   ALBUMIN 3.6  --    CBG: Recent Labs  Lab 11/12/18 2000 11/13/18 0716 11/13/18 0748 11/13/18 1107 11/13/18 1627  GLUCAP 173* 65* 140* 162* 170*   Urine analysis:    Component Value Date/Time   COLORURINE YELLOW 08/19/2018 1120   APPEARANCEUR CLEAR 08/19/2018 1120   LABSPEC 1.014 08/19/2018 1120   PHURINE 5.0 08/19/2018 1120   GLUCOSEU NEGATIVE 08/19/2018 1120   HGBUR NEGATIVE 08/19/2018 1120   BILIRUBINUR NEGATIVE 08/19/2018 1120   KETONESUR NEGATIVE 08/19/2018 1120   PROTEINUR NEGATIVE 08/19/2018 1120   UROBILINOGEN 0.2 01/05/2014 0744   NITRITE NEGATIVE 08/19/2018 1120   LEUKOCYTESUR NEGATIVE 08/19/2018 1120    Recent Results (from the past 240 hour(s))  SARS CORONAVIRUS 2 Nasal Swab Aptima Multi Swab     Status: None   Collection Time: 11/11/18  6:23 PM   Specimen: Aptima Multi Swab; Nasal Swab  Result Value Ref Range Status   SARS Coronavirus 2 NEGATIVE NEGATIVE Final    Comment: (NOTE) SARS-CoV-2 target nucleic acids are NOT DETECTED. The SARS-CoV-2 RNA is generally detectable in upper and lower respiratory specimens during the acute phase of infection. Negative results do not preclude SARS-CoV-2 infection,  do not rule out co-infections with other pathogens, and should not be used as the sole basis for treatment or other patient management decisions. Negative results must be combined with clinical observations, patient history, and epidemiological information. The expected result is Negative. Fact Sheet for Patients: SugarRoll.be Fact Sheet for Healthcare Providers: https://www.woods-mathews.com/ This test is not yet approved or cleared by the [place] FDA and  has been authorized for detection and/or diagnosis of SARS-CoV-2 by FDA under an Emergency Use Authorization (EUA). This EUA will remain  in effect (meaning this test  can be used) for the duration of the COVID-19 declaration under Section 56 4(b)(1) of the Act, 21 U.S.C. section 360bbb-3(b)(1), unless the authorization is terminated or revoked sooner. Performed at [facility], [place] [address], [place], [place] [address]      Radiology Studies: Dg Chest 1 View  Result Date: 11/11/2018 CLINICAL DATA:  Shortness of breath. EXAM: CHEST  1 VIEW COMPARISON:  10/06/2018 FINDINGS: Stable position of biventricular pacemaker. The cardiac silhouette is enlarged. Mediastinal contours appear intact. There is no evidence of focal airspace consolidation, pleural effusion or pneumothorax. Osseous structures are without acute abnormality. Soft tissues are grossly normal. IMPRESSION: 1. Enlarged cardiac silhouette. 2. No evidence of pulmonary edema or focal airspace consolidation. Electronically Signed   By: Fidela Salisbury M.D.   On: 11/11/2018 18:57    Scheduled Meds: . apixaban  5 mg Oral BID  . carvedilol  25 mg Oral BID WC  . furosemide  60 mg Intravenous Q12H  . insulin aspart  0-5 Units Subcutaneous QHS  . insulin aspart  0-9 Units Subcutaneous TID WC  . insulin glargine  40 Units Subcutaneous QHS  . magnesium oxide  400 mg Oral Daily  . metolazone  2.5 mg Oral BID  . potassium chloride  20 mEq Oral Daily  . rosuvastatin  20 mg Oral q1800  . sodium chloride flush  3 mL Intravenous Q12H   Continuous Infusions: . sodium chloride       LOS: 1 day    Time spent: 30 minutes.   Barton Dubois, MD [facility] Pager [phone]  11/13/2018, 5:53 PM

## 2018-11-14 LAB — GLUCOSE, CAPILLARY
Glucose-Capillary: 60 mg/dL — ABNORMAL LOW (ref 70–99)
Glucose-Capillary: 156 mg/dL — ABNORMAL HIGH (ref 70–99)
Glucose-Capillary: 114 mg/dL — ABNORMAL HIGH (ref 70–99)
Glucose-Capillary: 94 mg/dL (ref 70–99)
Glucose-Capillary: 150 mg/dL — ABNORMAL HIGH (ref 70–99)

## 2018-11-14 LAB — BASIC METABOLIC PANEL
Anion gap: 11 (ref 5–15)
BUN: 34 mg/dL — ABNORMAL HIGH (ref 8–23)
CO2: 23 mmol/L (ref 22–32)
Calcium: 8.2 mg/dL — ABNORMAL LOW (ref 8.9–10.3)
Chloride: 101 mmol/L (ref 98–111)
Creatinine, Ser: 1.48 mg/dL — ABNORMAL HIGH (ref 0.61–1.24)
GFR calc Af Amer: 54 mL/min — ABNORMAL LOW (ref >60)
GFR calc non Af Amer: 47 mL/min — ABNORMAL LOW (ref >60)
Glucose, Bld: 78 mg/dL (ref 70–99)
Potassium: 3.1 mmol/L — ABNORMAL LOW (ref 3.5–5.1)
Sodium: 135 mmol/L (ref 135–145)

## 2018-11-14 MED ORDER — METOLAZONE 5 MG PO TABS
2.5000 mg | ORAL_TABLET | Freq: Every day | ORAL | Status: DC
  Administered 2018-11-15 – 2018-11-17 (×3): 2.5 mg via ORAL
  Filled 2018-11-14 (×3): qty 1

## 2018-11-14 MED ORDER — INSULIN GLARGINE 100 UNIT/ML ~~LOC~~ SOLN
35.0000 [IU] | Freq: Every day | SUBCUTANEOUS | Status: DC
  Administered 2018-11-14 – 2018-11-16 (×3): 35 [IU] via SUBCUTANEOUS
  Filled 2018-11-14 (×4): qty 0.35

## 2018-11-14 MED ORDER — POTASSIUM CHLORIDE CRYS ER 20 MEQ PO TBCR
20.0000 meq | EXTENDED_RELEASE_TABLET | Freq: Two times a day (BID) | ORAL | Status: DC
  Administered 2018-11-14 – 2018-11-15 (×3): 20 meq via ORAL
  Filled 2018-11-14 (×3): qty 1

## 2018-11-14 MED ORDER — FUROSEMIDE 10 MG/ML IJ SOLN: 80.0000 mg | Freq: Two times a day (BID) | INTRAMUSCULAR | Status: DC

## 2018-11-14 MED ORDER — FUROSEMIDE 10 MG/ML IJ SOLN
80.0000 mg | Freq: Two times a day (BID) | INTRAMUSCULAR | Status: DC
  Administered 2018-11-14 – 2018-11-17 (×7): 80 mg via INTRAVENOUS
  Filled 2018-11-14 (×7): qty 8

## 2018-11-14 NOTE — Progress Notes (Signed)
PROGRESS NOTE    WARD John [name]  UKG:[id number] DOB: Oct 12, 1945 DOA: 11/11/2018 PCP: Mikey Kirschner, MD     Brief Narrative:  73 y.o. male with medical history significant for atrial flutter on Eliquis, complete heart block with pacer, insulin-dependent diabetes mellitus, chronic systolic CHF, and chronic kidney disease stage III, now presenting to the emergency department for evaluation of exertional dyspnea, weight gain, increased leg swelling, and orthopnea.  Patient reports progressive worsening in bilateral lower extremity edema, orthopnea, and exertional dyspnea over the past few days.  He was seen by home health PT today who noted a 14 pound weight gain over the past 4 days, noted that the patient had reduced exercise tolerance, and this was discussed with the patient's cardiology office who advised that he present to the ED.  He had been out of his torsemide for a few days approximately 3 weeks ago, and had been on increased potassium due to hypokalemia, but otherwise no changes in his medications and no missed doses.  He denies any chest pain. Denies fevers.  Denies cough.   ED Course: Upon arrival to the ED, patient is found to be afebrile, saturating low to mid 90s on room air at rest, and with stable blood pressure.  EKG features a ventricular paced rhythm.  Chest x-ray official read pending but with apparent cardiomegaly, vascular congestion, and increased vascular pedicle width, no conspicuous pneumothorax or consolidation.  Chemistry panel features a sodium of 131, total bilirubin 2.7, and creatinine 1.64, up from 1.37 earlier this month.  CBC is unremarkable.  BNP is elevated to 1718.  COVID-19 screening test is in process.  Patient was given 60 mg IV Lasix in the ED and hospitalists consulted for admission   Assessment & Plan: 1-acute on chronic systolic CHF (congestive heart failure) ([place]) -With concern for diet/medication noncompliance -Still with signs of fluid overload and  complaining of orthopnea -Follow daily weights, strict I's and O's and low-sodium diet -Appreciate assistance and recommendation by cardiology service. -Continue IV diuresis, (dose adjusted by cardiology service) and will continue the use of daily metolazone.  -Continue to follow electrolytes and renal function closely.  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  5-atrial fibrillation; chronic -Patient is status post pacemaker implantation -CHADsVASC score 4 -Continue beta-blocker and Eliquis  6-hyponatremia -Serum sodium 131 on admission -Appears to be secondary to hypervolemia -Continue IV diuresis -Monitor sodium trend  7-class II obesity -Body mass index is 37.94 kg/m. -Low calorie diet, portion control and increase physical activity has been discussed with patient.  8-hypokalemia -Associated with ongoing IV diuresis -Provide potassium supplementation and replete as needed   DVT prophylaxis: Eliquis Code Status: Full code Family Communication: No family at bedside. Disposition Plan: Continue IV diuresis, follow cardiology service recommendations, daily weights, strict I's and O's, low-sodium diet.  Close monitoring of electrolytes and renal function.  Consultants:   Cardiology service.  Procedures:   See below for x-ray reports.  Antimicrobials:  Anti-infectives (From admission, onward)   None       Subjective:  No fever, no chest pain, no nausea, no vomiting.  Patient reporting orthopnea and is still with significant signs of fluid overload.  Objective: Vitals:   11/13/18 1939 11/13/18 2139 11/14/18 0500 11/14/18 0547  BP:  111/80  106/66  Pulse:  76  (!) 59  Resp:  19  20  Temp:  98 F (36.7 C)  97.8 F (36.6 C)  TempSrc:  Oral  Oral  SpO2: 91% 95%  100%  Weight:   123.4 kg   Height:        Intake/Output Summary (Last 24 hours) at 11/14/2018 1321 Last data filed at 11/14/2018 1134 Gross per 24 hour  Intake 360 ml  Output 2250 ml  Net -1890 ml   Filed Weights   11/12/18 0500 11/13/18 0603 11/14/18 0500  Weight: 123.9 kg 126 kg 123.4 kg    Examination: General exam: Alert, awake, oriented x 3;, no nausea, no vomiting.  Patient still reporting orthopnea and with signs of fluid overload on exam. Respiratory system: Decreased breath sounds at the bases, no frank crackles, no wheezing, no using accessory muscles, good oxygen saturation on room air.   Cardiovascular system:Rate controlled.  Rubs, no gallops, unable to properly assess JVD with body habitus. Gastrointestinal system: Abdomen is obese, nondistended, soft and nontender. No organomegaly or masses felt. Normal bowel sounds heard. Central nervous system: Alert and oriented. No focal neurological deficits. Extremities: No cyanosis; 3+ edema bilaterally. Skin: No rashes, lesions or ulcers Psychiatry: Judgement and insight appear normal. Mood & affect appropriate.    Data Reviewed: I have personally reviewed following labs and imaging studies  CBC: Recent Labs  Lab 11/11/18 1812  WBC 8.7  NEUTROABS 5.5  HGB 13.0  HCT 39.6  MCV 86.5  PLT 355   Basic Metabolic Panel: Recent Labs  Lab 11/11/18 1812 11/12/18 0504 11/13/18 0537 11/14/18 0517  NA 131* 134* 133* 135  K 4.8 4.3 3.9 3.1*  CL 101 103 103 101  CO2 22 15* 22 23  GLUCOSE 122* 95 81 78  BUN 33* 32* 33* 34*  CREATININE 1.64* 1.59* 1.55* 1.48*   CALCIUM 8.7* 8.3* 8.0* 8.2*   GFR: Estimated Creatinine Clearance: 60.3 mL/min (A) (by C-G formula based on SCr of 1.48 mg/dL (H)).   Liver Function Tests: Recent Labs  Lab 11/11/18 1812 11/12/18 0504  AST 43*  --   ALT 29  --   ALKPHOS 173*  --   BILITOT 2.7* 2.2*  PROT 8.1  --   ALBUMIN 3.6  --    CBG: Recent Labs  Lab 11/13/18 1627 11/13/18 2041 11/14/18 0744 11/14/18 0912 11/14/18 1113  GLUCAP 170* 212* 60* 156* 114*   Urine analysis:    Component Value Date/Time   COLORURINE YELLOW 08/19/2018 1120   APPEARANCEUR CLEAR 08/19/2018 1120   LABSPEC 1.014 08/19/2018 1120   PHURINE 5.0 08/19/2018 1120   GLUCOSEU NEGATIVE 08/19/2018 1120   HGBUR NEGATIVE 08/19/2018 1120   BILIRUBINUR NEGATIVE 08/19/2018 1120   KETONESUR NEGATIVE 08/19/2018 1120   PROTEINUR NEGATIVE 08/19/2018 1120   UROBILINOGEN 0.2 01/05/2014 0744   NITRITE NEGATIVE 08/19/2018 1120   LEUKOCYTESUR NEGATIVE 08/19/2018 1120    Recent Results (from the past 240 hour(s))  SARS CORONAVIRUS 2 Nasal [name] John [name]     Status: None   Collection Time: 11/11/18  6:23 PM   Specimen: John [name]; Nasal [name]  Result Value Ref Range Status   SARS Coronavirus 2 NEGATIVE NEGATIVE Final    Comment: (NOTE)  SARS-CoV-2 target nucleic acids are NOT DETECTED. The SARS-CoV-2 RNA is generally detectable in upper and lower respiratory specimens during the acute phase of infection. Negative results do not preclude SARS-CoV-2 infection, do not rule out co-infections with other pathogens, and should not be used as the sole basis for treatment or other patient management decisions. Negative results must be combined with clinical observations, patient history, and epidemiological information. The expected result is Negative. Fact Sheet for Patients: SugarRoll.be Fact Sheet for Healthcare Providers: https://www.woods-mathews.com/ This test is not yet approved or  cleared by the [place] FDA and  has been authorized for detection and/or diagnosis of SARS-CoV-2 by FDA under an Emergency Use Authorization (EUA). This EUA will remain  in effect (meaning this test can be used) for the duration of the COVID-19 declaration under Section 56 4(b)(1) of the Act, 21 U.S.C. section 360bbb-3(b)(1), unless the authorization is terminated or revoked sooner. Performed at [facility], [place] [address], [place], [place] [address]      Radiology Studies: No results found.  Scheduled Meds: . apixaban  5 mg Oral BID  . carvedilol  25 mg Oral BID WC  . furosemide  80 mg Intravenous Q12H  . insulin aspart  0-5 Units Subcutaneous QHS  . insulin aspart  0-9 Units Subcutaneous TID WC  . insulin glargine  35 Units Subcutaneous QHS  . magnesium oxide  400 mg Oral Daily  . [START ON 11/15/2018] metolazone  2.5 mg Oral Daily  . potassium chloride  20 mEq Oral BID  . rosuvastatin  20 mg Oral q1800  . sodium chloride flush  3 mL Intravenous Q12H   Continuous Infusions: . sodium chloride       LOS: 2 days    Time spent: 30 minutes.   Barton Dubois, MD [facility] Pager [phone]  11/14/2018, 1:21 PM

## 2018-11-14 NOTE — Care Management Important Message (Signed)
Important Message  Patient Details  Name: John [name] MRN: [id number] Date of Birth: [birth year]   Medicare Important Message Given:  Yes     Tommy Medal 11/14/2018, 2:18 PM

## 2018-11-14 NOTE — Consult Note (Addendum)
Cardiology Consult    Patient ID: RADIN RAPTIS; [id number]; 1945/12/13   Admit date: 11/11/2018 Date of Consult: 11/14/2018  Primary Care Provider: Mikey Kirschner, MD Primary Cardiologist: [name], MD  Primary Electrophysiologist: Dr. Lovena Le  Patient Profile    John [name] is a 73 y.o. male with past medical history of chronic combined systolic and diastolic CHF (EF 78-67% by echo in 07/2018), CHB (s/p PPM placement in 2017), atrial flutter (s/p ablation in 2012), paroxysmal atrial fibrillation, HTN, HLD, Stage 3 CKD, and mild AS who is being seen today for the evaluation of CHF and medication management at the request of Dr. Dyann Kief.   History of Present Illness    Mr. Roupp was last examined by Dr. Bronson Ing in 09/2018 and reported a recent 4 lb weight gain and Torsemide had been titrated from 36m daily and 48min AM/2013mn PM on alternating days to 16m75mD. Was having exertional dyspnea but denies any associated chest pain or palpitations. Options were reviewed in regards to inpatient vs outpatient management and Torsemide was increased to 60mg44m along with being prescribed Metolazone 5mg f60mthe next 3 days then 2.5mg ev59m other day. He had a nurse visit on 10/29/2018 and weight had declined by 10 lbs, at 119.7 kg (263 lbs)   Home Health called the after hours line on 8/18 reporting the patient had gained 16 lbs in 4 days with worsening dyspnea and edema, therefore he was informed to go to the Emergency Dept for further evaluation. Initial labs showed WBC 8.7, Hgb 13.0, platelets 150, Na+ 131, K+ 4.8, and creatinine 1.64 (baseline 1.3 - 1.5). BNP 1718. COVID negative. CXR showed no acute findings. EKG showed V-pacing, HR 68, overall similar to prior tracings.   He was started on IV Lasix 60mg BI81mon admission with Metolazone 2.5mg BID 5mng added yesterday given minimal urine output. Overall -2.5L this admission (-1.6L yesterday). Variable weights have been  recorded this admission (peaking at 277 lbs, now at 272 lbs). Repeat BMET this AM shows K+ at 3.1 and creatinine stable at 1.48.  In talking with the patient, he reports minimal improvement in his dyspnea and edema since admission. Still having orthopnea. Denies any recent chest pain or palpitations. Says his baseline weight is between 253 - 255 lbs but was 262 lbs in 07/2018 and 260 lbs at his office visit on 09/2018.  Past Medical History:  Diagnosis Date  . Atrial flutter (HCC) 10/2[place]   Admitted with symptomatic bradycardia (HR 40s), atrial flutter with slow ventricular response 12/2010 + volume overload; AV nodal agents d/c'd and Pradaxa started; RFA in 01/2011  . CHF (congestive heart failure) (HCC)   . [place] combined systolic and diastolic heart failure (HCC)    echo 01/06/11: mild LVH, EF 65-70%, mod to severe LAE, mild RVE, mild RAE, PASP 32;   TEE 10/12: EF 45-50%   . Class 2 severe obesity due to excess calories with serious comorbidity and body mass index (BMI) of 37.0 to 37.9 in adult (HCC) 4/24[place]19  . Diabetes mellitus    non insulin dependant  . Hyperlipidemia   . Hypertension   . Osteoarthritis   . Presence of permanent cardiac pacemaker   . PSVT (paroxysmal supraventricular tachycardia) (HCC)    Possibly atrial flutter    Past Surgical History:  Procedure Laterality Date  . ATRIAL FLUTTER ABLATION N/A 02/07/2011   Procedure: ATRIAL FLUTTER ABLATION;  Surgeon: Gregg W TEvans Lance[facility];  Service: Cardiovascular;  Laterality: N/A;  . CARDIAC ELECTROPHYSIOLOGY STUDY AND ABLATION  02/07/11  . CARDIOVERSION N/A 03/23/2016   Procedure: CARDIOVERSION;  Surgeon: Evans Lance, MD;  Location: [place];  Service: Cardiovascular;  Laterality: N/A;  . COLONOSCOPY N/A 04/17/2017   Procedure: COLONOSCOPY;  Surgeon: Rogene Houston, MD;  [facility];  Service: Endoscopy;  Laterality: N/A;  830  . EP IMPLANTABLE DEVICE N/A 08/29/2015   Procedure:  Pacemaker Implant;  Surgeon: Evans Lance, MD;  Location: [place] [facility];  Service: Cardiovascular;  Laterality: N/A;  . EP IMPLANTABLE DEVICE N/A 12/07/2015   Procedure: PPM Lead Revision/Repair;  Surgeon: Evans Lance, MD;  Location: [place] [facility];  Service: Cardiovascular;  Laterality: N/A;  . KNEE ARTHROSCOPY  2005   left  . LUMBAR SPINE SURGERY     "I've had 6 ORs 1972 thru 2004"  . POLYPECTOMY  04/17/2017   Procedure: POLYPECTOMY;  Surgeon: Rogene Houston, MD;  [facility];  Service: Endoscopy;;  transverse colon x3;     Home Medications:  Prior to Admission medications   Medication Sig Start Date End Date Taking? Authorizing Provider  acetaminophen (TYLENOL) 500 MG tablet Take 1,000 mg by mouth every 6 (six) hours as needed for moderate pain (thigh pain).   Yes [provider]  amLODipine (NORVASC) 10 MG tablet Take 1 tablet (10 mg total) by mouth daily. One daily 06/30/18  Yes Mikey Kirschner, MD  benzonatate (TESSALON) 100 MG capsule TAKE 1 CAPSULE (100 MG TOTAL) BY MOUTH EVERY 6 (SIX) HOURS AS NEEDED FOR COUGH. 09/09/18 09/09/19 Yes Lendon Colonel, NP  carvedilol (COREG) 25 MG tablet TAKE 1 TABLET (25 MG TOTAL) BY MOUTH 2 (TWO) TIMES DAILY WITH A MEAL. 09/18/18  Yes Herminio Commons, MD  Dulaglutide (TRULICITY) 1.5 IR/4.8NI SOPN Inject 1.5 mg into the skin once a week. Patient taking differently: Inject 1.5 mg into the skin every Sunday.  10/09/18  Yes Nida, Marella Chimes, MD  ELIQUIS 5 MG TABS tablet TAKE 1 TABLET TWICE DAILY Patient taking differently: Take 5 mg by mouth 2 (two) times daily.  05/14/18  Yes Evans Lance, MD  HYDROcodone-acetaminophen (NORCO/VICODIN) 5-325 MG tablet Take one tablet po every 4 hrs prn pain Patient taking differently: Take 1 tablet by mouth every 4 (four) hours as needed for moderate pain.  10/10/18  Yes Mikey Kirschner, MD  Magnesium 400 MG CAPS Take 400 mg by mouth daily.    Yes [provider]   metFORMIN (GLUCOPHAGE) 500 MG tablet Take 1 tablet (500 mg total) by mouth 2 (two) times daily with a meal. 09/19/18  Yes Nida, Marella Chimes, MD  Multiple Vitamins-Minerals (MULTIVITAMIN WITH MINERALS) tablet Take 1 tablet by mouth daily.   Yes [provider]  potassium chloride SA (KLOR-CON M20) 20 MEQ tablet 10/30/2018 Take 80 meq (4 tablets) every day Patient taking differently: Take 40 mEq by mouth daily.  11/06/18  Yes Kroeger, Daleen Snook M., PA-C  Resveratrol 100 MG CAPS Take 100 mg by mouth at bedtime.    Yes [provider]  rosuvastatin (CRESTOR) 20 MG tablet Take 1 tablet (20 mg total) by mouth daily. 06/30/18  Yes Mikey Kirschner, MD  torsemide (DEMADEX) 20 MG tablet Take 3 tablets (60 mg total) by mouth 2 (two) times daily. 10/25/18  Yes Cheryln Manly, NP  TRESIBA FLEXTOUCH 200 UNIT/ML SOPN INJECT 70 UNITS SUBCUTANEOUSLY AT BEDTIME Patient taking differently:  Inject 70 Units into the skin at bedtime.  10/16/18  Yes Cassandria Anger, MD    Inpatient Medications: Scheduled Meds: . apixaban  5 mg Oral BID  . carvedilol  25 mg Oral BID WC  . furosemide  80 mg Intravenous Q12H  . insulin aspart  0-5 Units Subcutaneous QHS  . insulin aspart  0-9 Units Subcutaneous TID WC  . insulin glargine  40 Units Subcutaneous QHS  . magnesium oxide  400 mg Oral Daily  . [START ON 11/15/2018] metolazone  2.5 mg Oral Daily  . potassium chloride  20 mEq Oral BID  . rosuvastatin  20 mg Oral q1800  . sodium chloride flush  3 mL Intravenous Q12H   Continuous Infusions: . sodium chloride     PRN Meds: sodium chloride, acetaminophen, benzonatate, ondansetron (ZOFRAN) IV, sodium chloride flush  Allergies:   No Known Allergies  Social History:   Social History   Socioeconomic History  . Marital status: Married    Spouse name: Not on file  . Number of children: Not on file  . Years of education: Not on file  . Highest education level: Not on file  Occupational History  .  Occupation: Immunologist: RETIRED  Social Needs  . Financial resource strain: Not hard at all  . Food insecurity    Worry: Never true    Inability: Never true  . Transportation needs    Medical: No    Non-medical: No  Tobacco Use  . Smoking status: Former Smoker    Packs/day: 1.00    Years: 10.00    Pack years: 10.00    Types: Cigarettes    Quit date: 03/26/1986    Years since quitting: 32.6  . Smokeless tobacco: Never Used  . Tobacco comment: "stopped cigarette  smoking 1988"  Substance and Sexual Activity  . Alcohol use: Never    Alcohol/week: 0.0 standard drinks    Frequency: Never    Comment: "quit alcohol ~ 2007"  . Drug use: No  . Sexual activity: Yes    Partners: Female  Lifestyle  . Physical activity    Days per week: 2 days    Minutes per session: 20 min  . Stress: Not at all  Relationships  . Social connections    Talks on phone: More than three times a week    Gets together: More than three times a week    Attends religious service: More than 4 times per year    Active member of club or organization: Yes    Attends meetings of clubs or organizations: More than 4 times per year    Relationship status: Married  . Intimate partner violence    Fear of current or ex partner: No    Emotionally abused: No    Physically abused: No    Forced sexual activity: No  Other Topics Concern  . Not on file  Social History Narrative  . Not on file     Family History:    Family History  Problem Relation Age of Onset  . Heart attack Mother   . Hypertension Mother   . Heart attack Father   . Hypertension Father   . Heart attack Brother   . Colon cancer Neg Hx       Review of Systems    General:  No chills, fever, night sweats or weight changes.  Cardiovascular:  No chest pain, palpitations, paroxysmal nocturnal dyspnea. Positive for dyspnea on exertion, orthopnea,  and edema.  Dermatological: No rash, lesions/masses Respiratory: No cough,  dyspnea Urologic: No hematuria, dysuria Abdominal:   No nausea, vomiting, diarrhea, bright red blood per rectum, melena, or hematemesis Neurologic:  No visual changes, wkns, changes in mental status. All other systems reviewed and are otherwise negative except as noted above.  Physical Exam/Data    Vitals:   11/13/18 1939 11/13/18 2139 11/14/18 0500 11/14/18 0547  BP:  111/80  106/66  Pulse:  76  (!) 59  Resp:  19  20  Temp:  98 F (36.7 C)  97.8 F (36.6 C)  TempSrc:  Oral  Oral  SpO2: 91% 95%  100%  Weight:   123.4 kg   Height:        Intake/Output Summary (Last 24 hours) at 11/14/2018 0847 Last data filed at 11/14/2018 0551 Gross per 24 hour  Intake 840 ml  Output 2300 ml  Net -1460 ml   Filed Weights   11/12/18 0500 11/13/18 0603 11/14/18 0500  Weight: 123.9 kg 126 kg 123.4 kg   Body mass index is 37.94 kg/m.   General: Pleasant male appearing in NAD Psych: Normal affect. Neuro: Alert and oriented X 3. Moves all extremities spontaneously. HEENT: Normal  Neck: Supple without bruits. JVD at 9 cm. Lungs:  Resp regular and unlabored, mild rales along bases bilaterally. Heart: RRR no s3, s4, 2/6 SEM along RUSB.  Abdomen: Soft, non-tender, non-distended, BS + x 4.  Extremities: No clubbing, cyanosis. 1+ pitting edema along LLE, 2+ pitting edema along RLE up to knee. DP/PT/Radials 2+ and equal bilaterally.   EKG:  The EKG was personally reviewed and demonstrates: V-pacing, HR 68, overall similar to prior tracings.   Telemetry:  Telemetry was personally reviewed and demonstrates: V-pacing, HR in 60's to 80's with occasional PVC's.    Labs/Studies     Relevant CV Studies:  NST: 08/2015  Findings consistent with prior myocardial infarction. Fixed inferior/inferoapical and anteroseptal defects without peri-infact ischemia.  This is a high risk study. High risk based on low ejection fraction. There is no current myocardium at jeopardy.  The left ventricular  ejection fraction is severely decreased (<30%).  Paced throughout study, EKG not interpretable for ischemia  Echocardiogram: 08/20/2018 IMPRESSIONS   1. The left ventricle has mildly reduced systolic function, with an ejection fraction of 45-50%. The cavity size was mild to moderately dilated. There is mildly increased left ventricular wall thickness. Left ventricular diastolic Doppler parameters are  consistent with pseudonormalization. Left ventrical global hypokinesis without regional wall motion abnormalities.  2. The right ventricle has normal systolic function. The cavity was normal. There is no increase in right ventricular wall thickness.  3. Left atrial size was severely dilated.  4. The aortic valve is tricuspid. Mild thickening of the aortic valve. Moderate calcification of the aortic valve. Mild stenosis of the aortic valve.  5. The ascending aorta is normal in size and structure.  6. The inferior vena cava was dilated in size with <50% respiratory variability.  Laboratory Data:  Chemistry Recent Labs  Lab 11/12/18 0504 11/13/18 0537 11/14/18 0517  NA 134* 133* 135  K 4.3 3.9 3.1*  CL 103 103 101  CO2 15* 22 23  GLUCOSE 95 81 78  BUN 32* 33* 34*  CREATININE 1.59* 1.55* 1.48*  CALCIUM 8.3* 8.0* 8.2*  GFRNONAA 43* 44* 47*  GFRAA 50* 51* 54*  ANIONGAP 16* 8 11    Recent Labs  Lab 11/11/18 1812 11/12/18 0504  PROT 8.1  --  ALBUMIN 3.6  --   AST 43*  --   ALT 29  --   ALKPHOS 173*  --   BILITOT 2.7* 2.2*   Hematology Recent Labs  Lab 11/11/18 1812  WBC 8.7  RBC 4.58  HGB 13.0  HCT 39.6  MCV 86.5  MCH 28.4  MCHC 32.8  RDW 19.2*  PLT 150   Cardiac EnzymesNo results for input(s): TROPONINI in the last 168 hours. No results for input(s): TROPIPOC in the last 168 hours.  BNP Recent Labs  Lab 11/11/18 1812  BNP 1,718.0*    DDimer No results for input(s): DDIMER in the last 168 hours.  Radiology/Studies:  Dg Chest 1 View  Result Date:  11/11/2018 CLINICAL DATA:  Shortness of breath. EXAM: CHEST  1 VIEW COMPARISON:  10/06/2018 FINDINGS: Stable position of biventricular pacemaker. The cardiac silhouette is enlarged. Mediastinal contours appear intact. There is no evidence of focal airspace consolidation, pleural effusion or pneumothorax. Osseous structures are without acute abnormality. Soft tissues are grossly normal. IMPRESSION: 1. Enlarged cardiac silhouette. 2. No evidence of pulmonary edema or focal airspace consolidation. Electronically Signed   By: Fidela Salisbury M.D.   On: 11/11/2018 18:57     Assessment & Plan    1. Acute on Chronic Combined Systolic and Diastolic CHF - known mildly reduced EF of 45-50% by echo in 07/2018. Presented with worsening dyspnea, edema and weight gain with BNP elevated to 1718. Baseline weight of 255 - 260 lbs and peaking at 277 lbs this admission. Now at 272 lbs but at least 12 lbs above baseline.  - he has been receiving IV Lasix 54m BID with minimal output and was started on Metolazone yesterday with a net output of -1.6L. Net -2.5L this admission. He remains significantly volume overloaded by examination. Will titrate Lasix from 666mBID to 8042mID. Given this, would only dose Metolazone 2.5mg13mce daily for now and reassess renal function in AM. Continue to follow I&O's along with daily weights.  If no significant uptrend in output, could consider Lasix drip. He was on Torsemide 60mg8m prior to admission and was no longer using Metolazone given issues with hypokalemia (this appears to be driven by issues obtaining refills on K-dur from his Pharmacy). Would expect he will require the continued use of intermittent Metolazone use as an outpatient (1-2 times weekly at minimum for now).   2. Complete Heart Block - s/p PPM placement in 2017. Followed by Dr. TayloLovena Ledevice interrogation in 09/2018 showed normal device function with persistent atrial fibrillation.   3. Persistent Atrial  Fibrillation - he denies any recent palpitations and rates have been controlled in the 60's to 80's by review of telemetry. Continue Coreg for rate-control.  - he denies any evidence of active bleeding. Continue Eliquis 5mg B65mfor anticoagulation.   4. HTN - BP has been low-normal at 104/62 - 111/80 within the past 24 hours. Continue PTA Coreg 25mg B1mAmlodipine held given soft BP.   5. Aortic Stenosis - mild AS by echocardiogram in 07/2018 as outlined above. Continue to follow as an outpatient.    6. Hypokalemia - K+ 3.1 this AM. Replacement already ordered by the admitting team. Repeat BMET in AM.   7. Stage 3 CKD - baseline creatinine 1.3 - 1.5, elevated to 1.64 on admission. Improved to 1.48 today.    For questions or updates, please contact [facility] consult [email or web address] for contact info under Cardiology/STEMI.  Signed, BrittanErma Heritage8/21/2020,  8:47 AM Pager: [phone]   Attending note:  Patient seen and examined, I reviewed his records and discussed the case with Ms. Ahmed Prima PA-C.  Mr. Weatherall presents with a two-week history of increasing shortness of breath, leg swelling and increased abdominal girth.  He is on Demadex most recently at 60 mg twice daily as an outpatient and was also given a 3-day course of metolazone.  Despite this he has continued to hold fluid and has gained approximately 15 to 20 pounds over baseline.  Pain or palpitations otherwise and states that he has been taking his medications regularly.  On examination this morning he appears comfortable at rest.  He is afebrile with heart rate in the 60s with paced rhythm by telemetry which I personally reviewed.  Systolic is in the 703-403 range.  He is diuresed approximately 2.5 L out more than and so far.  Lungs exhibit decreased breath sounds without wheezing.  Cardiac exam reveals RRR without gallop.  Abdomen is protuberant. He has 3+ leg edema.  Lab work shows BUN 34, creatinine  1.48 down from 1.55, hemoglobin 13.0.  Last echocardiogram in May of this year revealed LVEF 45 to 50% with global hypokinesis and mild aortic stenosis.  Patient presents with acute on chronic combined heart failure, still has evidence of fluid overload with approximately 12 pounds to achieve prior baseline weight.  Agree with increasing IV Lasix to 80 mg twice daily and continue metolazone at 2.5 mg with careful follow-up of urine output and renal function.  If adequate diuresis is not achieved, could consider converting to a Lasix infusion as well.  Once back at prior baseline, can resume Demadex and perhaps use metolazone on at least a few days a week basis.  He needs potassium supplementation as well.  Satira Sark, M.D., F.A.C.C.

## 2018-11-14 NOTE — Progress Notes (Signed)
Inpatient Diabetes Program Recommendations  AACE/ADA: New Consensus Statement on Inpatient Glycemic Control (2015)  Target Ranges:  Prepandial:   less than 140 mg/dL      Peak postprandial:   less than 180 mg/dL (1-2 hours)      Critically ill patients:  140 - 180 mg/dL   Lab Results  Component Value Date   GLUCAP 114 (H) 11/14/2018   HGBA1C 7.8 (H) 09/12/2018    Review of Glycemic Control Results for John [name], John [name] (MRN [id number]) as of 11/14/2018 13:13  Ref. Range 11/13/2018 16:27 11/13/2018 20:41 11/14/2018 07:44 11/14/2018 09:12 11/14/2018 11:13  Glucose-Capillary Latest Ref Range: 70 - 99 mg/dL 170 (H) 212 (H) 60 (L) 156 (H) 114 (H)   Diabetes history: DM2 Outpatient Diabetes medications: Tresiba 70 units qd + Trulicity 1.5 weekly + Metformin 500 mg bid Current orders for Inpatient glycemic control: Lantus 40 units + Novolog sensitive correction tid   Inpatient Diabetes Program Recommendations:   -Fasting CBG 60 Consider decrease Lantus to 35 units daily  Thank you, John Roys E. Jovana Rembold, RN, MSN, CDE  Diabetes Coordinator Inpatient Glycemic Control Team Team Pager [phone] (8am-5pm) 11/14/2018 1:19 PM'

## 2018-11-15 LAB — GLUCOSE, CAPILLARY
Glucose-Capillary: 92 mg/dL (ref 70–99)
Glucose-Capillary: 155 mg/dL — ABNORMAL HIGH (ref 70–99)
Glucose-Capillary: 207 mg/dL — ABNORMAL HIGH (ref 70–99)
Glucose-Capillary: 177 mg/dL — ABNORMAL HIGH (ref 70–99)

## 2018-11-15 LAB — BASIC METABOLIC PANEL
Anion gap: 10 (ref 5–15)
BUN: 31 mg/dL — ABNORMAL HIGH (ref 8–23)
CO2: 26 mmol/L (ref 22–32)
Calcium: 8.1 mg/dL — ABNORMAL LOW (ref 8.9–10.3)
Chloride: 98 mmol/L (ref 98–111)
Creatinine, Ser: 1.42 mg/dL — ABNORMAL HIGH (ref 0.61–1.24)
GFR calc Af Amer: 57 mL/min — ABNORMAL LOW (ref >60)
GFR calc non Af Amer: 49 mL/min — ABNORMAL LOW (ref >60)
Glucose, Bld: 110 mg/dL — ABNORMAL HIGH (ref 70–99)
Potassium: 2.3 mmol/L — CL (ref 3.5–5.1)
Sodium: 134 mmol/L — ABNORMAL LOW (ref 135–145)

## 2018-11-15 MED ORDER — POTASSIUM CHLORIDE CRYS ER 20 MEQ PO TBCR
40.0000 meq | EXTENDED_RELEASE_TABLET | Freq: Once | ORAL | Status: AC
  Administered 2018-11-15: 40 meq via ORAL
  Filled 2018-11-15: qty 2

## 2018-11-15 MED ORDER — POTASSIUM CHLORIDE CRYS ER 20 MEQ PO TBCR
40.0000 meq | EXTENDED_RELEASE_TABLET | Freq: Two times a day (BID) | ORAL | Status: DC
  Administered 2018-11-15 – 2018-11-16 (×2): 40 meq via ORAL
  Filled 2018-11-15 (×2): qty 2

## 2018-11-15 NOTE — Progress Notes (Signed)
CRITICAL VALUE ALERT  Critical Value:  K 2.3  Date & Time Notied:  11/15/18 @ 0800.  Provider Notified: Dyann Kief, MD  Orders Received/Actions taken: Awaiting orders.

## 2018-11-15 NOTE — Progress Notes (Signed)
PROGRESS NOTE    John [name]  ONG:[id number] DOB: [birth year] DOA: 11/11/2018 PCP: Mikey Kirschner, MD     Brief Narrative:  73 y.o. male with medical history significant for atrial flutter on Eliquis, complete heart block with pacer, insulin-dependent diabetes mellitus, chronic systolic CHF, and chronic kidney disease stage III, now presenting to the emergency department for evaluation of exertional dyspnea, weight gain, increased leg swelling, and orthopnea.  Patient reports progressive worsening in bilateral lower extremity edema, orthopnea, and exertional dyspnea over the past few days.  He was seen by home health PT today who noted a 14 pound weight gain over the past 4 days, noted that the patient had reduced exercise tolerance, and this was discussed with the patient's cardiology office who advised that he present to the ED.  He had been out of his torsemide for a few days approximately 3 weeks ago, and had been on increased potassium due to hypokalemia, but otherwise no changes in his medications and no missed doses.  He denies any chest pain. Denies fevers.  Denies cough.   ED Course: Upon arrival to the ED, patient is found to be afebrile, saturating low to mid 90s on room air at rest, and with stable blood pressure.  EKG features a ventricular paced rhythm.  Chest x-ray official read pending but with apparent cardiomegaly, vascular congestion, and increased vascular pedicle width, no conspicuous pneumothorax or consolidation.  Chemistry panel features a sodium of 131, total bilirubin 2.7, and creatinine 1.64, up from 1.37 earlier this month.  CBC is unremarkable.  BNP is elevated to 1718.  COVID-19 screening test is in process.  Patient was given 60 mg IV Lasix in the ED and hospitalists consulted for admission   Assessment & Plan: 1-acute on chronic systolic CHF (congestive heart failure) ([place]) -With concern for diet/medication noncompliance -Still with signs of fluid overload and  complaining of mild orthopnea; even improving. -Significant improvement in urine output; patient currently -7 L, weight 272 on admission down to 266. -Follow daily weights, strict I's and O's and low-sodium diet -Appreciate assistance and recommendations by cardiology service. -Continue IV diuresis, (dose adjusted as per cardiology service instructions) and will continue the use of daily metolazone.  -Continue to follow electrolytes and renal function closely.  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  5-atrial fibrillation; chronic -Patient is status post pacemaker implantation -CHADsVASC score 4 -Continue beta-blocker and Eliquis  6-hyponatremia/hypokalemia -Serum sodium 131 on admission -Appears to be secondary to hypervolemia -Continue IV diuresis -Monitor sodium trend -Potassium has been repleted; adjusted dose for daily supplementation started.  7-class II obesity -Body mass index is 37.17 kg/m. -Low calorie diet, portion control and increase physical activity has been discussed with patient.  DVT prophylaxis: Eliquis Code Status: Full code Family Communication: No family at bedside. Disposition Plan: Continue IV diuresis, follow cardiology service recommendations, continue daily weights, strict I's and O's, low-sodium diet.  Close monitoring of electrolytes and renal function.  Consultants:   Cardiology service.   Procedures:   See below for x-ray  reports.  Antimicrobials:  Anti-infectives (From admission, onward)   None      Subjective: No fever, no chest pain, no nausea, no vomiting.  Mild orthopnea has been reported; no palpitations, significant improvement in urine output.  Objective: Vitals:   11/14/18 1457 11/14/18 2136 11/15/18 0500 11/15/18 0513  BP: 104/69 (!) 101/58  (!) 114/57  Pulse: 63 (!) 59  (!) 51  Resp: 16 16  18   Temp: 98.3 F (36.8 C) 98.3 F (36.8 C)  98.6 F (37 C)  TempSrc: Oral Oral  Oral  SpO2: 96% 99%  99%  Weight:   120.9 kg   Height:        Intake/Output Summary (Last 24 hours) at 11/15/2018 1244 Last data filed at 11/15/2018 0847 Gross per 24 hour  Intake 240 ml  Output 4775 ml  Net -4535 ml   Filed Weights   11/13/18 0603 11/14/18 0500 11/15/18 0500  Weight: 126 kg 123.4 kg 120.9 kg    Examination: General exam: Alert, awake, oriented x 3; reports feeling better denies chest pain.  No PND; very mild orthopnea intermittently.  Still with signs of fluid overload on physical exam.  Significant improvement on his urine output appreciated. Respiratory system: No wheezing, no frank crackles, decreased breath sounds at the bases.  Normal respiratory effort.  Good oxygen saturation on room air. Cardiovascular system:Rate controlled. No murmurs, rubs or gallops.  Mild JVD. Gastrointestinal system: Abdomen is obese, nondistended, soft and nontender. No organomegaly or masses felt. Normal bowel sounds heard. Central nervous system: Alert and oriented. No focal neurological deficits. Extremities: No cyanosis.  2+ edema bilaterally appreciated on exam. Skin: No rashes, lesions or ulcers Psychiatry: Judgement and insight appear normal. Mood & affect appropriate.    Data Reviewed: I have personally reviewed following labs and imaging studies  CBC: Recent Labs  Lab 11/11/18 1812  WBC 8.7  NEUTROABS 5.5  HGB 13.0  HCT 39.6  MCV 86.5  PLT 643   Basic  Metabolic Panel: Recent Labs  Lab 11/11/18 1812 11/12/18 0504 11/13/18 0537 11/14/18 0517 11/15/18 0632  NA 131* 134* 133* 135 134*  K 4.8 4.3 3.9 3.1* 2.3*  CL 101 103 103 101 98  CO2 22 15* 22 23 26   GLUCOSE 122* 95 81 78 110*  BUN 33* 32* 33* 34* 31*  CREATININE 1.64* 1.59* 1.55* 1.48* 1.42*  CALCIUM 8.7* 8.3* 8.0* 8.2* 8.1*   GFR: Estimated Creatinine Clearance: 62.2 mL/min (A) (by C-G formula based on SCr of 1.42 mg/dL (H)).   Liver Function Tests: Recent Labs  Lab 11/11/18 1812 11/12/18 0504  AST 43*  --   ALT 29  --   ALKPHOS 173*  --   BILITOT 2.7* 2.2*  PROT 8.1  --   ALBUMIN 3.6  --    CBG: Recent Labs  Lab 11/14/18 1113 11/14/18 1649 11/14/18 2218 11/15/18 0733 11/15/18 1120  GLUCAP 114* 94 150* 92 155*   Urine analysis:    Component Value Date/Time   COLORURINE YELLOW 08/19/2018 1120   APPEARANCEUR CLEAR 08/19/2018 1120   LABSPEC 1.014 08/19/2018 1120   PHURINE 5.0 08/19/2018 1120   GLUCOSEU NEGATIVE 08/19/2018 1120   HGBUR NEGATIVE 08/19/2018 [place] 08/19/2018 1120   KETONESUR NEGATIVE 08/19/2018 1120   PROTEINUR NEGATIVE 08/19/2018 1120   UROBILINOGEN 0.2 01/05/2014 0744   NITRITE NEGATIVE 08/19/2018 1120   LEUKOCYTESUR NEGATIVE 08/19/2018 1120    Recent Results (from the past 240 hour(s))  SARS CORONAVIRUS 2  Nasal Swab Aptima Multi Swab     Status: None   Collection Time: 11/11/18  6:23 PM   Specimen: Aptima Multi Swab; Nasal Swab  Result Value Ref Range Status   SARS Coronavirus 2 NEGATIVE NEGATIVE Final    Comment: (NOTE) SARS-CoV-2 target nucleic acids are NOT DETECTED. The SARS-CoV-2 RNA is generally detectable in upper and lower respiratory specimens during the acute phase of infection. Negative results do not preclude SARS-CoV-2 infection, do not rule out co-infections with other pathogens, and should not be used as the sole basis for treatment or other patient management decisions. Negative results must  be combined with clinical observations, patient history, and epidemiological information. The expected result is Negative. Fact Sheet for Patients: SugarRoll.be Fact Sheet for Healthcare Providers: https://www.woods-mathews.com/ This test is not yet approved or cleared by the [place] FDA and  has been authorized for detection and/or diagnosis of SARS-CoV-2 by FDA under an Emergency Use Authorization (EUA). This EUA will remain  in effect (meaning this test can be used) for the duration of the COVID-19 declaration under Section 56 4(b)(1) of the Act, 21 U.S.C. section 360bbb-3(b)(1), unless the authorization is terminated or revoked sooner. Performed at [facility], [place] [address] [place], [place] [address]      Radiology Studies: No results found.  Scheduled Meds: . apixaban  5 mg Oral BID  . carvedilol  25 mg Oral BID WC  . furosemide  80 mg Intravenous Q12H  . insulin aspart  0-5 Units Subcutaneous QHS  . insulin aspart  0-9 Units Subcutaneous TID WC  . insulin glargine  35 Units Subcutaneous QHS  . magnesium oxide  400 mg Oral Daily  . metolazone  2.5 mg Oral Daily  . potassium chloride  40 mEq Oral BID  . rosuvastatin  20 mg Oral q1800  . sodium chloride flush  3 mL Intravenous Q12H   Continuous Infusions: . sodium chloride       LOS: 3 days   Time spent: 30 minutes.   Barton Dubois, MD [facility] Pager [phone]  11/15/2018, 12:44 PM

## 2018-11-16 LAB — BASIC METABOLIC PANEL
Anion gap: 12 (ref 5–15)
BUN: 26 mg/dL — ABNORMAL HIGH (ref 8–23)
CO2: 31 mmol/L (ref 22–32)
Calcium: 8.6 mg/dL — ABNORMAL LOW (ref 8.9–10.3)
Chloride: 94 mmol/L — ABNORMAL LOW (ref 98–111)
Creatinine, Ser: 1.25 mg/dL — ABNORMAL HIGH (ref 0.61–1.24)
GFR calc Af Amer: >60 mL/min (ref >60)
GFR calc non Af Amer: 57 mL/min — ABNORMAL LOW (ref >60)
Glucose, Bld: 50 mg/dL — ABNORMAL LOW (ref 70–99)
Potassium: 2.2 mmol/L — CL (ref 3.5–5.1)
Sodium: 137 mmol/L (ref 135–145)

## 2018-11-16 LAB — GLUCOSE, CAPILLARY
Glucose-Capillary: 50 mg/dL — ABNORMAL LOW (ref 70–99)
Glucose-Capillary: 204 mg/dL — ABNORMAL HIGH (ref 70–99)
Glucose-Capillary: 165 mg/dL — ABNORMAL HIGH (ref 70–99)
Glucose-Capillary: 257 mg/dL — ABNORMAL HIGH (ref 70–99)

## 2018-11-16 MED ORDER — POTASSIUM CHLORIDE CRYS ER 20 MEQ PO TBCR
40.0000 meq | EXTENDED_RELEASE_TABLET | Freq: Three times a day (TID) | ORAL | Status: DC
  Administered 2018-11-16 – 2018-11-17 (×3): 40 meq via ORAL
  Filled 2018-11-16 (×4): qty 2

## 2018-11-16 MED ORDER — MAGNESIUM SULFATE 2 GM/50ML IV SOLN
2.0000 g | Freq: Once | INTRAVENOUS | Status: AC
  Administered 2018-11-16: 2 g via INTRAVENOUS
  Filled 2018-11-16: qty 50

## 2018-11-16 NOTE — Progress Notes (Signed)
CRITICAL VALUE ALERT  Critical Value:  K 2.2  Date & Time Notied:  8/23 @0830   Provider Notified: Dyann Kief, MD.  Orders Received/Actions taken: Increased PO K pills.

## 2018-11-16 NOTE — Progress Notes (Signed)
PROGRESS NOTE    John [name]  NFA:[id number] DOB: Feb 12, 1946 DOA: 11/11/2018 PCP: Mikey Kirschner, MD     Brief Narrative:  73 y.o. male with medical history significant for atrial flutter on Eliquis, complete heart block with pacer, insulin-dependent diabetes mellitus, chronic systolic CHF, and chronic kidney disease stage III, now presenting to the emergency department for evaluation of exertional dyspnea, weight gain, increased leg swelling, and orthopnea.  Patient reports progressive worsening in bilateral lower extremity edema, orthopnea, and exertional dyspnea over the past few days.  He was seen by home health PT today who noted a 14 pound weight gain over the past 4 days, noted that the patient had reduced exercise tolerance, and this was discussed with the patient's cardiology office who advised that he present to the ED.  He had been out of his torsemide for a few days approximately 3 weeks ago, and had been on increased potassium due to hypokalemia, but otherwise no changes in his medications and no missed doses.  He denies any chest pain. Denies fevers.  Denies cough.   ED Course: Upon arrival to the ED, patient is found to be afebrile, saturating low to mid 90s on room air at rest, and with stable blood pressure.  EKG features a ventricular paced rhythm.  Chest x-ray official read pending but with apparent cardiomegaly, vascular congestion, and increased vascular pedicle width, no conspicuous pneumothorax or consolidation.  Chemistry panel features a sodium of 131, total bilirubin 2.7, and creatinine 1.64, up from 1.37 earlier this month.  CBC is unremarkable.  BNP is elevated to 1718.  COVID-19 screening test is in process.  Patient was given 60 mg IV Lasix in the ED and hospitalists consulted for admission   Assessment & Plan: 1-acute on chronic systolic CHF (congestive heart failure) ([place]) -With concern for diet/medication noncompliance -Still with signs of fluid overload and  complaining of mild orthopnea; even improving. -Significant improvement in urine output; patient currently -12 L, weight 272 on admission down to 257 lb. -Follow daily weights, strict I's and O's and low-sodium diet -Appreciate assistance and recommendations by cardiology service. -Continue IV diuresis, (dose adjusted as per cardiology service instructions) and will continue the use of daily metolazone.  -Continue to follow electrolytes and renal function closely.  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  5-atrial fibrillation; chronic -Patient is status post pacemaker implantation -CHADsVASC score 4 -Continue beta-blocker and Eliquis  6-hyponatremia/hypokalemia -Serum sodium 131 on admission -Appears to be secondary to hypervolemia -Continue IV diuresis -Monitor sodium trend -Potassium has been repleted; adjusted dose for daily supplementation started. -checking Mg level  7-class II obesity -Body mass index is 35.73 kg/m. -Low calorie diet, portion control and increase physical activity has been discussed with patient.  DVT prophylaxis: Eliquis Code Status: Full code Family Communication: No family at bedside. Disposition Plan: Continue IV diuresis, follow cardiology service recommendations, continue daily weights, strict I's and O's, low-sodium diet.  Close monitoring of electrolytes and renal function.  Consultants:    Cardiology service.  Procedures:  See below for x-ray reports.  Antimicrobials:  Anti-infectives (From admission, onward)   None      Subjective: No fever, no chest pain, no nausea, no vomiting.  Patient reports no orthopnea; still with signs of fluid overload on exam.  Objective: Vitals:   11/15/18 1340 11/15/18 2040 11/16/18 0512 11/16/18 1331  BP: 117/65 (!) 103/54 104/89 109/82  Pulse: 63 (!) 58 69 85  Resp: 19 19 19 18   Temp: 98.3 F (36.8 C) 98.3 F (36.8 C) 97.9 F (36.6 C) 97.8 F (36.6 C)  TempSrc: Oral Oral Oral Oral  SpO2: 97% 97% 98% 94%  Weight:   116.2 kg   Height:        Intake/Output Summary (Last 24 hours) at 11/16/2018 1515 Last data filed at 11/16/2018 1400 Gross per 24 hour  Intake 240 ml  Output 6200 ml  Net -5960 ml   Filed Weights   11/14/18 0500 11/15/18 0500 11/16/18 0512  Weight: 123.4 kg 120.9 kg 116.2 kg    Examination: General exam: Alert, awake, oriented x 3; feeling much better, denying chest pain, no palpitations, no orthopnea.  Still with signs of fluid overload on physical exam. Respiratory system: Decreased breath sounds at the bases, no wheezing, normal respiratory effort. Cardiovascular system:Rate controlled. No murmurs, rubs, gallops. Gastrointestinal system: Abdomen is obese, nondistended, soft and nontender. No organomegaly or masses felt. Normal bowel sounds heard. Central nervous system: Alert and oriented. No focal neurological deficits. Extremities: No Cyanosis, 2+ edema bilaterally Skin: No rashes, lesions or ulcers Psychiatry: Judgement and insight appear normal. Mood & affect appropriate.    Data Reviewed: I have personally reviewed following labs and imaging studies  CBC: Recent Labs  Lab 11/11/18 1812  WBC 8.7  NEUTROABS 5.5  HGB 13.0  HCT 39.6  MCV 86.5  PLT 527   Basic Metabolic Panel: Recent Labs  Lab 11/12/18 0504 11/13/18 0537 11/14/18 0517 11/15/18 0632 11/16/18 0633  NA 134* 133* 135  134* 137  K 4.3 3.9 3.1* 2.3* 2.2*  CL 103 103 101 98 94*  CO2 15* 22 23 26 31   GLUCOSE 95 81 78 110* 50*  BUN 32* 33* 34* 31* 26*  CREATININE 1.59* 1.55* 1.48* 1.42* 1.25*  CALCIUM 8.3* 8.0* 8.2* 8.1* 8.6*   GFR: Estimated Creatinine Clearance: 69.3 mL/min (A) (by C-G formula based on SCr of 1.25 mg/dL (H)).   Liver Function Tests: Recent Labs  Lab 11/11/18 1812 11/12/18 0504  AST 43*  --   ALT 29  --   ALKPHOS 173*  --   BILITOT 2.7* 2.2*  PROT 8.1  --   ALBUMIN 3.6  --    CBG: Recent Labs  Lab 11/15/18 1120 11/15/18 1658 11/15/18 2038 11/16/18 0735 11/16/18 1124  GLUCAP 155* 207* 177* 50* 204*   Urine analysis:    Component Value Date/Time   COLORURINE YELLOW 08/19/2018 1120   APPEARANCEUR CLEAR 08/19/2018 1120   LABSPEC 1.014 08/19/2018 1120   PHURINE 5.0 08/19/2018 1120   GLUCOSEU NEGATIVE 08/19/2018 1120   HGBUR NEGATIVE 08/19/2018 1120   BILIRUBINUR NEGATIVE 08/19/2018 1120   KETONESUR NEGATIVE 08/19/2018 1120   PROTEINUR NEGATIVE 08/19/2018 1120   UROBILINOGEN 0.2 01/05/2014 0744   NITRITE NEGATIVE 08/19/2018 1120   LEUKOCYTESUR NEGATIVE 08/19/2018 1120    Recent Results (from the past 240 hour(s))  SARS CORONAVIRUS 2 Nasal Swab Aptima Multi Swab     Status: None   Collection Time: 11/11/18  6:23 PM   Specimen: Aptima Multi Swab;  Nasal Swab  Result Value Ref Range Status   SARS Coronavirus 2 NEGATIVE NEGATIVE Final    Comment: (NOTE) SARS-CoV-2 target nucleic acids are NOT DETECTED. The SARS-CoV-2 RNA is generally detectable in upper and lower respiratory specimens during the acute phase of infection. Negative results do not preclude SARS-CoV-2 infection, do not rule out co-infections with other pathogens, and should not be used as the sole basis for treatment or other patient management decisions. Negative results must be combined with clinical observations, patient history, and epidemiological information. The expected result is Negative.  Fact Sheet for Patients: SugarRoll.be Fact Sheet for Healthcare Providers: https://www.woods-mathews.com/ This test is not yet approved or cleared by the [place] FDA and  has been authorized for detection and/or diagnosis of SARS-CoV-2 by FDA under an Emergency Use Authorization (EUA). This EUA will remain  in effect (meaning this test can be used) for the duration of the COVID-19 declaration under Section 56 4(b)(1) of the Act, 21 U.S.C. section 360bbb-3(b)(1), unless the authorization is terminated or revoked sooner. Performed at [facility], [place] [address] [place], [place] [address]      Radiology Studies: No results found.  Scheduled Meds: . apixaban  5 mg Oral BID  . carvedilol  25 mg Oral BID WC  . furosemide  80 mg Intravenous Q12H  . insulin aspart  0-5 Units Subcutaneous QHS  . insulin aspart  0-9 Units Subcutaneous TID WC  . insulin glargine  35 Units Subcutaneous QHS  . metolazone  2.5 mg Oral Daily  . potassium chloride  40 mEq Oral TID  . rosuvastatin  20 mg Oral q1800  . sodium chloride flush  3 mL Intravenous Q12H   Continuous Infusions: . sodium chloride       LOS: 4 days   Time spent: 30 minutes.   Barton Dubois, MD [facility] Pager [phone]  11/16/2018, 3:15 PM

## 2018-11-17 DIAGNOSIS — I10 Essential (primary) hypertension: Secondary | ICD-10-CM

## 2018-11-17 DIAGNOSIS — N183 Chronic kidney disease, stage 3 (moderate): Secondary | ICD-10-CM

## 2018-11-17 DIAGNOSIS — E669 Obesity, unspecified: Secondary | ICD-10-CM

## 2018-11-17 DIAGNOSIS — E876 Hypokalemia: Secondary | ICD-10-CM

## 2018-11-17 DIAGNOSIS — Z950 Presence of cardiac pacemaker: Secondary | ICD-10-CM

## 2018-11-17 DIAGNOSIS — I5043 Acute on chronic combined systolic (congestive) and diastolic (congestive) heart failure: Secondary | ICD-10-CM

## 2018-11-17 DIAGNOSIS — I4819 Other persistent atrial fibrillation: Secondary | ICD-10-CM

## 2018-11-17 DIAGNOSIS — E1121 Type 2 diabetes mellitus with diabetic nephropathy: Secondary | ICD-10-CM

## 2018-11-17 DIAGNOSIS — E66812 Obesity, class 2: Secondary | ICD-10-CM

## 2018-11-17 DIAGNOSIS — I442 Atrioventricular block, complete: Secondary | ICD-10-CM

## 2018-11-17 LAB — BASIC METABOLIC PANEL
Anion gap: 10 (ref 5–15)
Anion gap: 13 (ref 5–15)
BUN: 22 mg/dL (ref 8–23)
BUN: 23 mg/dL (ref 8–23)
CO2: 34 mmol/L — ABNORMAL HIGH (ref 22–32)
CO2: 31 mmol/L (ref 22–32)
Calcium: 8.4 mg/dL — ABNORMAL LOW (ref 8.9–10.3)
Calcium: 8.4 mg/dL — ABNORMAL LOW (ref 8.9–10.3)
Chloride: 91 mmol/L — ABNORMAL LOW (ref 98–111)
Chloride: 88 mmol/L — ABNORMAL LOW (ref 98–111)
Creatinine, Ser: 1.12 mg/dL (ref 0.61–1.24)
Creatinine, Ser: 1.29 mg/dL — ABNORMAL HIGH (ref 0.61–1.24)
GFR calc Af Amer: >60 mL/min (ref >60)
GFR calc Af Amer: >60 mL/min (ref >60)
GFR calc non Af Amer: >60 mL/min (ref >60)
GFR calc non Af Amer: 55 mL/min — ABNORMAL LOW (ref >60)
Glucose, Bld: 92 mg/dL (ref 70–99)
Glucose, Bld: 134 mg/dL — ABNORMAL HIGH (ref 70–99)
Potassium: 2.1 mmol/L — CL (ref 3.5–5.1)
Potassium: 2.9 mmol/L — ABNORMAL LOW (ref 3.5–5.1)
Sodium: 135 mmol/L (ref 135–145)
Sodium: 132 mmol/L — ABNORMAL LOW (ref 135–145)

## 2018-11-17 LAB — GLUCOSE, CAPILLARY
Glucose-Capillary: 206 mg/dL — ABNORMAL HIGH (ref 70–99)
Glucose-Capillary: 83 mg/dL (ref 70–99)
Glucose-Capillary: 145 mg/dL — ABNORMAL HIGH (ref 70–99)

## 2018-11-17 LAB — MAGNESIUM: Magnesium: 2.6 mg/dL — ABNORMAL HIGH (ref 1.7–2.4)

## 2018-11-17 MED ORDER — POTASSIUM CHLORIDE CRYS ER 20 MEQ PO TBCR: 40.0000 meq | EXTENDED_RELEASE_TABLET | Freq: Three times a day (TID) | ORAL | 1 refills | Status: DC

## 2018-11-17 MED ORDER — TORSEMIDE 20 MG PO TABS: 60.0000 mg | ORAL_TABLET | Freq: Two times a day (BID) | ORAL | 0 refills | Status: DC

## 2018-11-17 MED ORDER — METOLAZONE 2.5 MG PO TABS: 2.5000 mg | ORAL_TABLET | ORAL | 1 refills | Status: DC

## 2018-11-17 MED ORDER — POTASSIUM CHLORIDE CRYS ER 20 MEQ PO TBCR
40.0000 meq | EXTENDED_RELEASE_TABLET | Freq: Once | ORAL | Status: AC
  Administered 2018-11-17: 40 meq via ORAL
  Filled 2018-11-17: qty 2

## 2018-11-17 MED ORDER — POTASSIUM CHLORIDE 10 MEQ/100ML IV SOLN
10.0000 meq | INTRAVENOUS | Status: AC
  Administered 2018-11-17 (×2): 10 meq via INTRAVENOUS
  Filled 2018-11-17 (×2): qty 100

## 2018-11-17 MED ORDER — FUROSEMIDE 10 MG/ML IJ SOLN: 60.0000 mg | Freq: Two times a day (BID) | INTRAMUSCULAR | Status: DC

## 2018-11-17 NOTE — Progress Notes (Addendum)
Progress Note  Patient Name: John [name] Date of Encounter: 11/17/2018  Primary Cardiologist: [name], MD    Subjective   Feeling better, urinated a lot last night  Inpatient Medications    Scheduled Meds: . apixaban  5 mg Oral BID  . carvedilol  25 mg Oral BID WC  . furosemide  60 mg Intravenous Q12H  . insulin aspart  0-5 Units Subcutaneous QHS  . insulin aspart  0-9 Units Subcutaneous TID WC  . insulin glargine  35 Units Subcutaneous QHS  . potassium chloride  40 mEq Oral TID  . rosuvastatin  20 mg Oral q1800  . sodium chloride flush  3 mL Intravenous Q12H   Continuous Infusions: . sodium chloride     PRN Meds: sodium chloride, acetaminophen, benzonatate, ondansetron (ZOFRAN) IV, sodium chloride flush   Vital Signs    Vitals:   11/16/18 2005 11/16/18 2146 11/17/18 0541 11/17/18 0823  BP:  115/61 (!) 115/59   Pulse: 62 (!) 59 (!) 59   Resp: 16 18 19    Temp:  98.4 F (36.9 C) 98.2 F (36.8 C)   TempSrc:  Oral Oral   SpO2: 96% 97% 99% 98%  Weight:   114.7 kg   Height:        Intake/Output Summary (Last 24 hours) at 11/17/2018 0920 Last data filed at 11/17/2018 0726 Gross per 24 hour  Intake 243 ml  Output 4130 ml  Net -3887 ml   Last 3 Weights 11/17/2018 11/16/2018 11/15/2018  Weight (lbs) 252 lb 13.9 oz 256 lb 2.8 oz 266 lb 8.6 oz  Weight (kg) 114.7 kg 116.2 kg 120.9 kg      Telemetry    Atrial paced, pvc's some NSVT - Personally Reviewed  ECG       Physical Exam    GEN: No acute distress.   Neck: No JVD Cardiac: FXT,0/2 systolic murmur LSB.  Respiratory: Clear to auscultation bilaterally. GI: Soft, nontender, non-distended  MS: plus 1-2 edema; No deformity. Neuro:  Nonfocal  Psych: Normal affect   Labs    High Sensitivity Troponin:  No results for input(s): TROPONINIHS in the last 720 hours.    Cardiac EnzymesNo results for input(s): TROPONINI in the last 168 hours. No results for input(s): TROPIPOC in the last 168 hours.    Chemistry Recent Labs  Lab 11/11/18 1812 11/12/18 0504  11/15/18 0632 11/16/18 0633 11/17/18 0444  NA 131* 134*   < > 134* 137 135  K 4.8 4.3   < > 2.3* 2.2* 2.1*  CL 101 103   < > 98 94* 91*  CO2 22 15*   < > 26 31 34*  GLUCOSE 122* 95   < > 110* 50* 92  BUN 33* 32*   < > 31* 26* 22  CREATININE 1.64* 1.59*   < > 1.42* 1.25* 1.12  CALCIUM 8.7* 8.3*   < > 8.1* 8.6* 8.4*  PROT 8.1  --   --   --   --   --   ALBUMIN 3.6  --   --   --   --   --   AST 43*  --   --   --   --   --   ALT 29  --   --   --   --   --   ALKPHOS 173*  --   --   --   --   --   BILITOT 2.7* 2.2*  --   --   --   --  GFRNONAA 41* 43*   < > 49* 57* >60  GFRAA 48* 50*   < > 57* >60 >60  ANIONGAP 8 16*   < > 10 12 10    < > = values in this interval not displayed.     Hematology Recent Labs  Lab 11/11/18 1812  WBC 8.7  RBC 4.58  HGB 13.0  HCT 39.6  MCV 86.5  MCH 28.4  MCHC 32.8  RDW 19.2*  PLT 150    BNP Recent Labs  Lab 11/11/18 1812  BNP 1,718.0*     DDimer No results for input(s): DDIMER in the last 168 hours.   Radiology    No results found.  Cardiac Studies    NST: 08/2015  Findings consistent with prior myocardial infarction. Fixed inferior/inferoapical and anteroseptal defects without peri-infact ischemia.  This is a high risk study. High risk based on low ejection fraction. There is no current myocardium at jeopardy.  The left ventricular ejection fraction is severely decreased (<30%).  Paced throughout study, EKG not interpretable for ischemia   Echocardiogram: 08/20/2018 IMPRESSIONS    1. The left ventricle has mildly reduced systolic function, with an ejection fraction of 45-50%. The cavity size was mild to moderately dilated. There is mildly increased left ventricular wall thickness. Left ventricular diastolic Doppler parameters are  consistent with pseudonormalization. Left ventrical global hypokinesis without regional wall motion abnormalities.  2. The right  ventricle has normal systolic function. The cavity was normal. There is no increase in right ventricular wall thickness.  3. Left atrial size was severely dilated.  4. The aortic valve is tricuspid. Mild thickening of the aortic valve. Moderate calcification of the aortic valve. Mild stenosis of the aortic valve.  5. The ascending aorta is normal in size and structure.  6. The inferior vena cava was dilated in size with <50% respiratory variability.     Patient Profile     73 y.o. male  with past medical history of chronic combined systolic and diastolic CHF (EF 02-72% by echo in 07/2018), CHB (s/p PPM placement in 2017), atrial flutter (s/p ablation in 2012), paroxysmal atrial fibrillation, HTN, HLD, Stage 3 CKD, and mild AS who was admitted with acute CHF    Assessment & Plan    Acute on chronic combined systolic and diastolic CHF EF 53-66% 06/4032. Baseline weight 255-260 lbs. Weight today 252 lbs I/O's negative 3.9 L past 24 hrs, 17 L since admission. On lasix 80 mg BID and metolazone 2.5 mg daily but bump in Crt today. Still with some leg edema. Will need to hold metolazone and decrease lasix 60 mg IV BID. Was on demadex 60 mg BID PTA  Persistent Atrial fibrillation CHADSVASC=4 on eliquis and coreg  S/P PPM for CHB-normal device function 09/2018  HTN  Mild AS  CKD stage 3 Crt 2.6, today up from 1.25 yesterday  Hypokalemia K 2.1 today-receiving IV supplement      For questions or updates, please contact [place] Please consult [email or web address] for contact info under        Signed, Ermalinda Barrios, PA-C  11/17/2018, 9:20 AM    The patient was seen and examined, and I agree with the history, physical exam, assessment and plan as documented above, with modifications as noted below. I have also personally reviewed all relevant documentation, old records, labs, and both radiographic and cardiovascular studies. I have also independently interpreted old and new ECG's.  He is  doing much better clinically. He has some residual leg  edema. He is on Lasix 60 mg IV bid. His K is quite low and he is having frequent PVC's. He received 40 meq KCl earlier this morning and is about to receive another 40 meq.  I think once his K is normalized (or close to it) he can be discharged.  I would recommend he be discharged on torsemide 60 mg bid with metolazone 2.5 mg every Monday and Friday to start. I would keep him on KCl 80 meq daily.  [name], MD, [facility]  11/17/2018 10:58 AM

## 2018-11-17 NOTE — Progress Notes (Signed)
CRITICAL VALUE ALERT  Critical Value:  Potassium 2.1  Date & Time Notied:  11/17/18 at [place]  Provider Notified: Dr. Darrick Meigs  Orders Received/Actions taken: Potassium 173mq ordered x2.

## 2018-11-17 NOTE — Discharge Summary (Signed)
Physician Discharge Summary  John [name] ZOX:[id number] DOB: [birth year] DOA: 11/11/2018  PCP: Mikey Kirschner, MD  Admit date: 11/11/2018 Discharge date: 11/17/2018  Time spent: 35 minutes  Recommendations for Outpatient Follow-up:  1. Repeat basic metabolic panel to follow electrolytes and renal function 2. Reassess patient volume status with further adjustment to diuretics regimen as needed. 3. Reassess blood pressure and adjust antihypertensive regimen as needed.   Discharge Diagnoses:  Principal Problem:   Acute on chronic systolic CHF (congestive heart failure) (HCC) Active Problems:   Diabetes mellitus with nephropathy (HCC)   Essential hypertension   Complete heart block (HCC)   Stage 3 chronic kidney disease (HCC)   Paroxysmal atrial fibrillation (HCC)   Hyponatremia   Hyperbilirubinemia   Acute on chronic combined systolic and diastolic HF (heart failure) (HCC)   Class 2 obesity   Discharge Condition: Stable and improved.  Patient discharged home with instruction to follow-up with PCP and cardiology as an outpatient.  Diet recommendation: Heart healthy modified carbohydrate diet.  Filed Weights   11/15/18 0500 11/16/18 0512 11/17/18 0541  Weight: 120.9 kg 116.2 kg 114.7 kg    History of present illness:  73 y.o.malewith medical history significant foratrial flutter on Eliquis, complete heart block with pacer, insulin-dependent diabetes mellitus, chronic systolic CHF, and chronic kidney disease stage III, now presenting to the emergency department for evaluation of exertional dyspnea, weight gain, increased leg swelling, and orthopnea. Patient reports progressive worsening in bilateral lower extremity edema, orthopnea, and exertional dyspnea over the past few days. He was seen by home health PT today who noted a 14 pound weight gain over the past 4 days, noted that the patient had reduced exercise tolerance, and this was discussed with the patient's cardiology  office who advised that he present to the ED. He had been out of his torsemide for a few days approximately 3 weeks ago, and had been on increased potassium due to hypokalemia, but otherwise no changes in his medications and no missed doses. He denies any chest pain. Denies fevers. Denies cough.  ED Course:Upon arrival to the ED, patient is found to be afebrile, saturating low to mid 90s on room air at rest, and with stable blood pressure. EKG features a ventricular paced rhythm. Chest x-ray official read pending but with apparent cardiomegaly, vascular congestion, and increased vascular pedicle width, no conspicuous pneumothorax or consolidation.Chemistry panel features a sodium of 131, total bilirubin 2.7, and creatinine 1.64, up from 1.37 earlier this month. CBC is unremarkable. BNP is elevated to 1718. COVID-19 screening test is in process. Patient was given 60 mg IV Lasix in the ED and hospitalistsconsulted for admission.  Hospital Course:  1-acute on chronic systolic CHF (congestive heart failure) (HCC) -Significant improvement in the overall fluid overload status; 1+ edema still present on his legs but much better.  No orthopnea, no PND, no shortness of breath. -Significant improvement in urine output; patient currently -15 L, weight 272 on admission and down to 252 lb at time of discharge. -Follow daily weights and O's and low-sodium diet -Appreciate assistance and recommendations by cardiology service. -Patient will be discharged home on 60 mg of Demadex and 2.5 mg of metolazone twice a week. -Follow-up with PCP in 5 days and follow-up in about a week or so with cardiology service as an outpatient.  2-acute on chronic stage III renal failure -Creatinine on admission 1.6 baseline around 1.3 -Appears to be secondary to acute CHF and decreased perfusion -After diuresis has been initiated  creatinine level trended down and currently 1.29 at discharge. -Continue to monitor renal  function and electrolytes trend -Patient advised to maintain adequate hydration and to follow low-sodium diet.  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 -Most recent A1c 7.8  4-Essential hypertension -Overall stable and well-controlled -Continue current antihypertensive regimen.  5-atrial fibrillation; chronic -Patient is status post pacemaker implantation -CHADsVASC score 4 -Continue beta-blocker and Eliquis -rate controlled.  6-hyponatremia/hypokalemia -Serum sodium 132-135 at discharge -Appears to be secondary to hypervolemia -Continue diuresis with adjusted medication dosage. -Monitor sodium trend at follow up visit.  -Potassium has been repleted; adjusted dose for daily supplementation started. -continue daily Mg supplementation.  7-class II obesity -Body mass index is 35.73 kg/m. -Low calorie diet, portion control and increase physical activity has been discussed with patient.  Procedures:  See below for x-ray reports.  Consultations:  Cardiology service  Discharge Exam: Vitals:   11/17/18 0541 11/17/18 0823  BP: (!) 115/59   Pulse: (!) 59   Resp: 19   Temp: 98.2 F (36.8 C)   SpO2: 99% 98%   General exam: Alert, awake, oriented x 3;  feeling significantly improved and currently denying chest pain, palpitations, shortness of breath, orthopnea and PND.  Patient ready to go home and asking to be discharged. Respiratory system:  Peripheral movement bilaterally, no wheezing, no crackles, no using accessory muscles; normal respiratory effort. Cardiovascular system:Rate controlled. No murmurs, rubs, gallops. Gastrointestinal system: Abdomen is obese, nondistended, soft and nontender. No organomegaly or masses felt. Normal bowel sounds heard. Central nervous system: Alert and oriented. No focal neurological deficits. Extremities: No Cyanosis, 1+ edema bilaterally Skin: No rashes,  lesions or ulcers Psychiatry: Judgement and insight appear normal. Mood & affect appropriate.    Discharge Instructions   Discharge Instructions    (HEART FAILURE PATIENTS) Call MD:  Anytime you have any of the following symptoms: 1) 3 pound weight gain in 24 hours or 5 pounds in 1 week 2) shortness of breath, with or without a dry hacking cough 3) swelling in the hands, feet or stomach 4) if you have to sleep on extra pillows at night in order to breathe.   Complete by: As directed    Diet - low sodium heart healthy   Complete by: As directed    Discharge instructions   Complete by: As directed    Take medications as prescribed Follow low-sodium diet (2-2.5 g of sodium in 24 hours) Maintain adequate hydration (2 L of fluids on daily basis) Arrange follow-up with PCP in 5 days; and follow-up with cardiology service in 1 week as arranged. Check weight on daily basis     Allergies as of 11/17/2018   No Known Allergies     Medication List    STOP taking these medications   amLODipine 10 MG tablet Commonly known as: NORVASC   benzonatate 100 MG capsule Commonly known as: TESSALON     TAKE these medications   acetaminophen 500 MG tablet Commonly known as: TYLENOL Take 1,000 mg by mouth every 6 (six) hours as needed for moderate pain (thigh pain).   carvedilol 25 MG tablet Commonly known as: COREG TAKE 1 TABLET (25 MG TOTAL) BY MOUTH 2 (TWO) TIMES DAILY WITH A MEAL.   Eliquis 5 MG Tabs tablet Generic drug: apixaban TAKE 1 TABLET TWICE DAILY What changed: how much to take   HYDROcodone-acetaminophen 5-325 MG tablet Commonly known as: NORCO/VICODIN Take one tablet po every  4 hrs prn pain What changed:   how much to take  how to take this  when to take this  reasons to take this  additional instructions   Magnesium 400 MG Caps Take 400 mg by mouth daily.   metFORMIN 500 MG tablet Commonly known as: GLUCOPHAGE Take 1 tablet (500 mg total) by mouth 2 (two)  times daily with a meal.   metolazone 2.5 MG tablet Commonly known as: ZAROXOLYN Take 1 tablet (2.5 mg total) by mouth 2 (two) times a week. Monday and Friday   multivitamin with minerals tablet Take 1 tablet by mouth daily.   potassium chloride SA 20 MEQ tablet Commonly known as: Klor-Con M20 Take 2 tablets (40 mEq total) by mouth 3 (three) times daily. What changed:   how much to take  how to take this  when to take this  additional instructions   Resveratrol 100 MG Caps Take 100 mg by mouth at bedtime.   rosuvastatin 20 MG tablet Commonly known as: CRESTOR Take 1 tablet (20 mg total) by mouth daily.   torsemide 20 MG tablet Commonly known as: DEMADEX Take 3 tablets (60 mg total) by mouth 2 (two) times daily.   Tyler Aas FlexTouch 200 UNIT/ML Sopn Generic drug: Insulin Degludec INJECT 70 UNITS SUBCUTANEOUSLY AT BEDTIME What changed: See the new instructions.   Trulicity 1.5 WI/0.9BD Sopn Generic drug: Dulaglutide Inject 1.5 mg into the skin once a week. What changed: when to take this      No Known Allergies Follow-up Information    Mikey Kirschner, MD Follow up in 5 day(s).   Specialty: Family Medicine Why: Wednesday, November 19, 2018 @ 2:00 p.m.  Contact information: [place] [address] [phone]        Herminio Commons, MD .   Specialty: Cardiology Contact information: [place] [address] [phone]           The results of significant diagnostics from this hospitalization (including imaging, microbiology, ancillary and laboratory) are listed below for reference.    Significant Diagnostic Studies: Dg Chest 1 View  Result Date: 11/11/2018 CLINICAL DATA:  Shortness of breath. EXAM: CHEST  1 VIEW COMPARISON:  10/06/2018 FINDINGS: Stable position of biventricular pacemaker. The cardiac silhouette is enlarged. Mediastinal contours appear intact. There is no evidence of focal airspace  consolidation, pleural effusion or pneumothorax. Osseous structures are without acute abnormality. Soft tissues are grossly normal. IMPRESSION: 1. Enlarged cardiac silhouette. 2. No evidence of pulmonary edema or focal airspace consolidation. Electronically Signed   By: Fidela Salisbury M.D.   On: 11/11/2018 18:57    Microbiology: Recent Results (from the past 240 hour(s))  SARS CORONAVIRUS 2 Nasal Swab Aptima Multi Swab     Status: None   Collection Time: 11/11/18  6:23 PM   Specimen: Aptima Multi Swab; Nasal Swab  Result Value Ref Range Status   SARS Coronavirus 2 NEGATIVE NEGATIVE Final    Comment: (NOTE) SARS-CoV-2 target nucleic acids are NOT DETECTED. The SARS-CoV-2 RNA is generally detectable in upper and lower respiratory specimens during the acute phase of infection. Negative results do not preclude SARS-CoV-2 infection, do not rule out co-infections with other pathogens, and should not be used as the sole basis for treatment or other patient management decisions. Negative results must be combined with clinical observations, patient history, and epidemiological information. The expected result is Negative. Fact Sheet for Patients: SugarRoll.be Fact Sheet for Healthcare Providers: https://www.woods-mathews.com/ This test is  not yet approved or cleared by the [place] and  has been authorized for detection and/or diagnosis of SARS-CoV-2 by FDA under an Emergency Use Authorization (EUA). This EUA will remain  in effect (meaning this test can be used) for the duration of the COVID-19 declaration under Section 56 4(b)(1) of the Act, 21 U.S.C. section 360bbb-3(b)(1), unless the authorization is terminated or revoked sooner. Performed at [facility], [place] [address], [place], [place] [address]      Labs: Basic Metabolic Panel: Recent Labs  Lab 11/14/18 [phone] 11/15/18 9563 11/16/18 0633 11/17/18 0444 11/17/18 1225   NA 135 134* 137 135 132*  K 3.1* 2.3* 2.2* 2.1* 2.9*  CL 101 98 94* 91* 88*  CO2 23 26 31  34* 31  GLUCOSE 78 110* 50* 92 134*  BUN 34* 31* 26* 22 23  CREATININE 1.48* 1.42* 1.25* 1.12 1.29*  CALCIUM 8.2* 8.1* 8.6* 8.4* 8.4*  MG  --   --   --  2.6*  --    Liver Function Tests: Recent Labs  Lab 11/11/18 1812 11/12/18 0504  AST 43*  --   ALT 29  --   ALKPHOS 173*  --   BILITOT 2.7* 2.2*  PROT 8.1  --   ALBUMIN 3.6  --    CBC: Recent Labs  Lab 11/11/18 1812  WBC 8.7  NEUTROABS 5.5  HGB 13.0  HCT 39.6  MCV 86.5  PLT 150    BNP (last 3 results) Recent Labs    08/19/18 1120 10/06/18 1944 11/11/18 1812  BNP 1,285.6* 1,072.2* 1,718.0*   CBG: Recent Labs  Lab 11/16/18 1124 11/16/18 1655 11/16/18 2146 11/17/18 0739 11/17/18 1104  GLUCAP 204* 165* 257* 83 145*    Signed:  Barton Dubois MD.  [facility] 11/17/2018, 2:21 PM

## 2018-11-17 NOTE — Care Management Important Message (Signed)
Important Message  Patient Details  Name: John [name] MRN: [id number] Date of Birth: 06-Nov-1945   Medicare Important Message Given:  Yes     Tommy Medal 11/17/2018, 10:08 AM

## 2018-11-19 ENCOUNTER — Ambulatory Visit (INDEPENDENT_AMBULATORY_CARE_PROVIDER_SITE_OTHER): Payer: Medicare HMO | Admitting: Family Medicine

## 2018-11-19 ENCOUNTER — Telehealth: Payer: Self-pay

## 2018-11-19 ENCOUNTER — Encounter: Payer: Self-pay | Admitting: Family Medicine

## 2018-11-19 ENCOUNTER — Other Ambulatory Visit: Payer: Self-pay

## 2018-11-19 VITALS — BP 146/90 | Temp 97.8°F | Wt 247.8 lb

## 2018-11-19 DIAGNOSIS — E876 Hypokalemia: Secondary | ICD-10-CM

## 2018-11-19 DIAGNOSIS — I509 Heart failure, unspecified: Secondary | ICD-10-CM

## 2018-11-19 NOTE — Progress Notes (Signed)
   Subjective:    Patient ID: John [name], male    DOB: 30-Jul-1945, 73 y.o.   MRN: [id number]  HPI Pt here today for hospital follow up. Pt was in [place] from 11/11/2018-11/17/2018 for CHF. Pt states he is feeling well today. No questions/concerns.  Patient seen within the 48 hours of discharge from hospital  Recently hospitalized for congestive heart failure.  Complicated by both diminished ejection fraction and paced ventricular beat secondary to complete heart block  Also complicated by renal insufficiency  Patient did have substantial hypokalemia.  Meds have been ordered today by cardiologist to follow-up on potassium  Patient has chronically down salt intake  Compliant with new dose of Demadex and the metazaloe   Review of Systems No headache no chest pain no abdominal pain    Objective:   Physical Exam   Alert and oriented, vitals reviewed and stable, NAD ENT-TM's and ext canals WNL bilat via otoscopic exam Soft palate, tonsils and post pharynx WNL via oropharyngeal exam Neck-symmetric, no masses; thyroid nonpalpable and nontender Pulmonary-no tachypnea or accessory muscle use; Clear without wheezes via auscultation Card--no abnrml murmurs, rhythm reg and rate WNL Carotid pulses symmetric, without bruits Ankles 1+ edema      Assessment & Plan:  Impression congestive heart failure complicated by ventricular pacer and chronic diminished ejection fraction and renal insufficiency.  Advised patient that fine-tuning her medication cardiologist.  They have increase his potassium.  Patient sent home with rather low potassium.  Diet discussed.  Exercise discussed.  Warning signs discussed follow-up regular appointment

## 2018-11-19 NOTE — Telephone Encounter (Signed)
Pt made aware. F[facility]ed [facility] slip to [facility].

## 2018-11-19 NOTE — Telephone Encounter (Signed)
-----   Message from Herminio Commons, MD sent at 11/17/2018  3:19 PM EDT ----- Regarding: Needs extra KCl Please ask him to take another 80 meq this evening. He'll need a BMET tomorrow.

## 2018-11-20 ENCOUNTER — Other Ambulatory Visit: Payer: Self-pay

## 2018-11-20 ENCOUNTER — Encounter: Payer: Self-pay | Admitting: Family Medicine

## 2018-11-20 ENCOUNTER — Other Ambulatory Visit (HOSPITAL_COMMUNITY)
Admission: RE | Admit: 2018-11-20 | Discharge: 2018-11-20 | Disposition: A | Discharge status: Home / Self Care | Payer: Medicare HMO | Source: Ambulatory Visit | Attending: Cardiovascular Disease | Admitting: Cardiovascular Disease

## 2018-11-20 DIAGNOSIS — Z79899 Other long term (current) drug therapy: Secondary | ICD-10-CM | POA: Insufficient documentation

## 2018-11-20 DIAGNOSIS — E876 Hypokalemia: Secondary | ICD-10-CM | POA: Diagnosis present

## 2018-11-20 LAB — BASIC METABOLIC PANEL
Anion gap: 14 (ref 5–15)
BUN: 24 mg/dL — ABNORMAL HIGH (ref 8–23)
CO2: 34 mmol/L — ABNORMAL HIGH (ref 22–32)
Calcium: 9.1 mg/dL (ref 8.9–10.3)
Chloride: 81 mmol/L — ABNORMAL LOW (ref 98–111)
Creatinine, Ser: 1.38 mg/dL — ABNORMAL HIGH (ref 0.61–1.24)
GFR calc Af Amer: 59 mL/min — ABNORMAL LOW (ref >60)
GFR calc non Af Amer: 51 mL/min — ABNORMAL LOW (ref >60)
Glucose, Bld: 371 mg/dL — ABNORMAL HIGH (ref 70–99)
Potassium: 3.3 mmol/L — ABNORMAL LOW (ref 3.5–5.1)
Sodium: 129 mmol/L — ABNORMAL LOW (ref 135–145)

## 2018-11-20 NOTE — Patient Outreach (Signed)
[facility]) Care Management  11/20/2018  John [name] Aug 12, 1945 [id number]   John [name] was admitted on 11/11/18 for Acute on chronic congestive heart failure. He was discharged on 11/17/18.  EMMI Red Alert received. Per notification, patient was unaware of who to contact regarding his condition.  Brief outreach with John [name]. HIPAA identifiers verified. He reports being aware of his MD contacts. Reports being evaluated by his primary care provider, Dr Wolfgang Phoenix, on 11/19/18. Also reports receiving instructions from the Cardiology clinic regarding an additional potassium chloride dose. He plans to complete labs later today. He is scheduled to follow-up with Cardiology on 11/21/18.  He reports taking medications as prescribed. Denies immediate needs or concerns regarding transportation. He is agreeable to complex case management due to his recent hospitalization. Agreeable to completing a telephonic assessment on 11/24/18.  PLAN -Will follow for complex case management. -Will complete telephonic assessment on 11/24/18.  [facility] [phone]

## 2018-11-21 ENCOUNTER — Ambulatory Visit (INDEPENDENT_AMBULATORY_CARE_PROVIDER_SITE_OTHER): Payer: Medicare HMO | Admitting: Student

## 2018-11-21 ENCOUNTER — Encounter: Payer: Self-pay | Admitting: Student

## 2018-11-21 ENCOUNTER — Other Ambulatory Visit: Payer: Self-pay

## 2018-11-21 VITALS — BP 122/84 | HR 64 | Wt 245.8 lb

## 2018-11-21 DIAGNOSIS — I480 Paroxysmal atrial fibrillation: Secondary | ICD-10-CM | POA: Diagnosis not present

## 2018-11-21 DIAGNOSIS — I442 Atrioventricular block, complete: Secondary | ICD-10-CM

## 2018-11-21 DIAGNOSIS — N183 Chronic kidney disease, stage 3 unspecified: Secondary | ICD-10-CM

## 2018-11-21 DIAGNOSIS — Z79899 Other long term (current) drug therapy: Secondary | ICD-10-CM | POA: Diagnosis not present

## 2018-11-21 DIAGNOSIS — I5042 Chronic combined systolic (congestive) and diastolic (congestive) heart failure: Secondary | ICD-10-CM | POA: Diagnosis not present

## 2018-11-21 DIAGNOSIS — I10 Essential (primary) hypertension: Secondary | ICD-10-CM | POA: Diagnosis not present

## 2018-11-21 DIAGNOSIS — E785 Hyperlipidemia, unspecified: Secondary | ICD-10-CM

## 2018-11-21 NOTE — Progress Notes (Signed)
Cardiology Office Note    Date:  11/21/2018   ID:  John [name], John [name] 1945-09-06, MRN [id number]  PCP:  Mikey Kirschner, MD  Cardiologist: [name], MD    Chief Complaint  Patient presents with  . Hospitalization Follow-up    History of Present Illness:    BRACH BIRDSALL is a 73 y.o. male with past medical history of chronic combined systolic and diastolic CHF (EF 45 to 38% by echo in 07/2018), atrial flutter (s/p ablation in 2012), paroxysmal atrial fibrillation, CHB (s/p PPM placement in 2017), HTN, HLD, and Stage 3 CKD who presents to the office today for hospital follow-up.  He had previously undergone titration of outpatient diuretic therapy with Torsemide being increased from 40 mg daily to 656m BID over the past month [facility] for worsening dyspnea and lower extremity edema. He reported a 16 pound weight gain within 4 days and BNP was elevated to 1718. He was started on IV Lasix upon admission but had minimal urine output, therefore Metolazone was added to his diuretic regimen. Weight peaked at 277 lbs but with diuretic therapy this declined to 252 lbs on 11/17/2018. Creatinine was 1.29 but he was hypokalemic at 2.9. He was discharged on 8/24 and his diuretic therapy was adjusted to Torsemide 659mBID with Metolazone 2.56m46mvery Monday and Friday along with K-dur 80 mEq daily.   Repeat outpatient BMET on 8/27 showed K+ at 3.3 and creatinine at 1.38. Was hyponatremic with Na+ at 129.  In talking with the patient today, he reports that his breathing has significantly improved since his recent hospitalization. No recurrent orthopnea or PND. He still experiences intermittent lower extremity edema and says this is typically worse in the evening hours. Has not been utilizing compression stockings regularly. Reports good urination with Torsemide.  Denies any recent chest pain or palpitations.   Past Medical History:  Diagnosis Date  .  Atrial flutter (HCCRoy0/2012   Admitted with symptomatic bradycardia (HR 40s), atrial flutter with slow ventricular response 12/2010 + volume overload; AV nodal agents d/c'd and Pradaxa started; RFA in 01/2011  . CHF (congestive heart failure) (HCCEnsign . Chronic combined systolic and diastolic heart failure ([facility]CCHomedale  a. echo 01/06/11: mild LVH, EF 65-70%, mod to severe LAE, mild RVE, mild RAE, PASP 32;   TEE 10/12: EF 45-50% b. EF 45 to 50% by echo in 07/2018  . Class 2 severe obesity due to excess calories with serious comorbidity and body mass index (BMI) of 37.0 to 37.9 in adult (HCCBascom/24/2019  . Diabetes mellitus    non insulin dependant  . Hyperlipidemia   . Hypertension   . Osteoarthritis   . Presence of permanent cardiac pacemaker   . PSVT (paroxysmal supraventricular tachycardia) (HCC)    Possibly atrial flutter    Past Surgical History:  Procedure Laterality Date  . ATRIAL FLUTTER ABLATION N/A 02/07/2011   Procedure: ATRIAL FLUTTER ABLATION;  Surgeon: GreEvans LanceD;  [facility];  Service: Cardiovascular;  Laterality: N/A;  . CARDIAC ELECTROPHYSIOLOGY STUDY AND ABLATION  02/07/11  . CARDIOVERSION N/A 03/23/2016   Procedure: CARDIOVERSION;  Surgeon: GreEvans LanceD;  [facility];  Laterality: N/A;  . COLONOSCOPY N/A 04/17/2017   Procedure: COLONOSCOPY;  Surgeon: RehRogene HoustonD;  [facility];  Service: Endoscopy;  Laterality: N/A;  830  . EP IMPLANTABLE DEVICE N/A 08/29/2015  Procedure: Pacemaker Implant;  Surgeon: Evans Lance, MD;  Location: [place] CV LAB;  Service: Cardiovascular;  Laterality: N/A;  . EP IMPLANTABLE DEVICE N/A 12/07/2015   Procedure: PPM Lead Revision/Repair;  Surgeon: Evans Lance, MD;  Location: [place] [facility] LAB;  Service: Cardiovascular;  Laterality: N/A;  . KNEE ARTHROSCOPY  2005   left  . LUMBAR SPINE SURGERY     "I've had 6 ORs 1972 thru 2004"  . POLYPECTOMY  04/17/2017    Procedure: POLYPECTOMY;  Surgeon: Rogene Houston, MD;  [facility];  Service: Endoscopy;;  transverse colon x3;    Current Medications: Outpatient Medications Prior to Visit  Medication Sig Dispense Refill  . acetaminophen (TYLENOL) 500 MG tablet Take 1,000 mg by mouth every 6 (six) hours as needed for moderate pain (thigh pain).    . benzonatate (TESSALON) 100 MG capsule Take by mouth 3 (three) times daily as needed for cough.    . carvedilol (COREG) 25 MG tablet TAKE 1 TABLET (25 MG TOTAL) BY MOUTH 2 (TWO) TIMES DAILY WITH A MEAL. 180 tablet 3  . Dulaglutide (TRULICITY) 1.5 IO/2.7OJ SOPN Inject 1.5 mg into the skin once a week. (Patient taking differently: Inject 1.5 mg into the skin every Sunday. ) 12 pen 0  . ELIQUIS 5 MG TABS tablet TAKE 1 TABLET TWICE DAILY (Patient taking differently: Take 5 mg by mouth 2 (two) times daily. ) 180 tablet 3  . HYDROcodone-acetaminophen (NORCO/VICODIN) 5-325 MG tablet Take one tablet po every 4 hrs prn pain (Patient taking differently: Take 1 tablet by mouth every 4 (four) hours as needed for moderate pain. ) 28 tablet 0  . Magnesium 400 MG CAPS Take 400 mg by mouth daily.     . metFORMIN (GLUCOPHAGE) 500 MG tablet Take 1 tablet (500 mg total) by mouth 2 (two) times daily with a meal. 180 tablet 1  . metolazone (ZAROXOLYN) 2.5 MG tablet Take 1 tablet (2.5 mg total) by mouth 2 (two) times a week. Monday and Friday 15 tablet 1  . Multiple Vitamins-Minerals (MULTIVITAMIN WITH MINERALS) tablet Take 1 tablet by mouth daily.    . potassium chloride SA (KLOR-CON M20) 20 MEQ tablet Take 2 tablets (40 mEq total) by mouth 3 (three) times daily. 360 tablet 1  . Resveratrol 100 MG CAPS Take 100 mg by mouth at bedtime.     . rosuvastatin (CRESTOR) 20 MG tablet Take 1 tablet (20 mg total) by mouth daily. 90 tablet 1  . torsemide (DEMADEX) 20 MG tablet Take 3 tablets (60 mg total) by mouth 2 (two) times daily. 540 tablet 0  . TRESIBA FLEXTOUCH 200 UNIT/ML SOPN  INJECT 70 UNITS SUBCUTANEOUSLY AT BEDTIME (Patient taking differently: Inject 70 Units into the skin at bedtime. ) 36 mL 0   No facility-administered medications prior to visit.      Allergies:   Patient has no known allergies.   Social History   Socioeconomic History  . Marital status: Married    Spouse name: Not on file  . Number of children: Not on file  . Years of education: Not on file  . Highest education level: Not on file  Occupational History  . Occupation: Immunologist: RETIRED  Social Needs  . Financial resource strain: Not hard at all  . Food insecurity    Worry: Never true    Inability: Never true  . Transportation needs    Medical: No    Non-medical: No  Tobacco Use  . Smoking status: Former Smoker    Packs/day: 1.00    Years: 10.00    Pack years: 10.00    Types: Cigarettes    Quit date: 03/26/1986    Years since quitting: 32.6  . Smokeless tobacco: Never Used  . Tobacco comment: "stopped cigarette  smoking 1988"  Substance and Sexual Activity  . Alcohol use: Never    Alcohol/week: 0.0 standard drinks    Frequency: Never    Comment: "quit alcohol ~ 2007"  . Drug use: No  . Sexual activity: Yes    Partners: Female  Lifestyle  . Physical activity    Days per week: 2 days    Minutes per session: 20 min  . Stress: Not at all  Relationships  . Social connections    Talks on phone: More than three times a week    Gets together: More than three times a week    Attends religious service: More than 4 times per year    Active member of club or organization: Yes    Attends meetings of clubs or organizations: More than 4 times per year    Relationship status: Married  Other Topics Concern  . Not on file  Social History Narrative  . Not on file     Family History:  The patient's family history includes Heart attack in his brother, father, and mother; Hypertension in his father and mother.   Review of Systems:   Please see the  history of present illness.     General:  No chills, fever, night sweats or weight changes.  Cardiovascular:  No chest pain, dyspnea on exertion,  orthopnea, palpitations, paroxysmal nocturnal dyspnea. Positive for edema.  Dermatological: No rash, lesions/masses Respiratory: No cough, dyspnea Urologic: No hematuria, dysuria Abdominal:   No nausea, vomiting, diarrhea, bright red blood per rectum, melena, or hematemesis Neurologic:  No visual changes, wkns, changes in mental status. All other systems reviewed and are otherwise negative except as noted above.   Physical Exam:    VS:  BP 122/84   Pulse 64   Wt 245 lb 12.8 oz (111.5 kg)   BMI 34.28 kg/m    General: Well developed, well nourished,male appearing in no acute distress. Head: Normocephalic, atraumatic, sclera non-icteric, no xanthomas, nares are without discharge.  Neck: No carotid bruits. JVD not elevated.  Lungs: Respirations regular and unlabored, without wheezes or rales.  Heart: Regular rate and rhythm. No S3 or S4.  No murmur, no rubs, or gallops appreciated. Abdomen: Soft, non-tender, non-distended with normoactive bowel sounds. No hepatomegaly. No rebound/guarding. No obvious abdominal masses. Msk:  Strength and tone appear normal for age. No joint deformities or effusions. Extremities: No clubbing or cyanosis. 1+ pitting edema up to mid-shins bilaterally.  Distal pedal pulses are 2+ bilaterally. Neuro: Alert and oriented X 3. Moves all extremities spontaneously. No focal deficits noted. Psych:  Responds to questions appropriately with a normal affect. Skin: No rashes or lesions noted  Wt Readings from Last 3 Encounters:  11/21/18 245 lb 12.8 oz (111.5 kg)  11/19/18 247 lb 12.8 oz (112.4 kg)  11/17/18 252 lb 13.9 oz (114.7 kg)     Studies/Labs Reviewed:   EKG:  EKG is not ordered today.   Recent Labs: 11/11/2018: ALT 29; B Natriuretic Peptide 1,718.0; Hemoglobin 13.0; Platelets 150 11/17/2018: Magnesium 2.6  11/20/2018: BUN 24; Creatinine, Ser 1.38; Potassium 3.3; Sodium 129   Lipid Panel    Component Value Date/Time   CHOL 142  01/17/2018 0835   TRIG 90 01/17/2018 0835   HDL 53 01/17/2018 0835   CHOLHDL 2.7 01/17/2018 0835   CHOLHDL 3.2 11/16/2013 0702   VLDL 45 (H) 11/16/2013 0702   LDLCALC 71 01/17/2018 0835    Additional studies/ records that were reviewed today include:   Echocardiogram: 07/2018 IMPRESSIONS    1. The left ventricle has mildly reduced systolic function, with an ejection fraction of 45-50%. The cavity size was mild to moderately dilated. There is mildly increased left ventricular wall thickness. Left ventricular diastolic Doppler parameters are  consistent with pseudonormalization. Left ventrical global hypokinesis without regional wall motion abnormalities.  2. The right ventricle has normal systolic function. The cavity was normal. There is no increase in right ventricular wall thickness.  3. Left atrial size was severely dilated.  4. The aortic valve is tricuspid. Mild thickening of the aortic valve. Moderate calcification of the aortic valve. Mild stenosis of the aortic valve.  5. The ascending aorta is normal in size and structure.  6. The inferior vena cava was dilated in size with <50% respiratory variability.   Assessment:    1. Chronic combined systolic (congestive) and diastolic (congestive) heart failure ([place])   2. Medication management   3. Paroxysmal atrial fibrillation (HCC)   4. Complete heart block ([place])   5. Essential hypertension   6. Hyperlipidemia LDL goal <70   7. CKD (chronic kidney disease), stage III ([place])      Plan:   In order of problems listed above:  1. Chronic Combined Systolic and Diastolic CHF - He has a known reduced EF of 45 to 50% by echocardiogram in 07/2018 and was recently admitted for an acute CHF exacerbation after having gained 16 pounds within 4 days. He responded well during admission and weight was 252 lbs at  discharge, now at 245 lbs on the office scales today and he reports this has been at 242 lbs at home. BMET yesterday showed stable renal function but he was hypokalemic with K-dur dosing being adjusted. Will plan to obtain a repeat BMET in 1 week for reassessment. Continue current regimen with Torsemide 41m BID with Metolazone 2.527mevery Monday and Friday along with K-dur 80 mEq daily.  - continue Coreg at current dosing.   2. Paroxysmal Atrial Fibrillation - he is s/p flutter ablation in 2012 but has experienced atrial fibrillation since.  - He denies any recent palpitations. Continue Carvedilol for rate control. He denies any evidence of active bleeding.Continue Eliquis 5 mg twice daily for anticoagulation.  3. Complete Heart Block - s/p PPM placement in 2017. Followed by Dr. TaLovena Lend most recent interrogation last month showed normal device function.  4. HTN - BP is well controlled at 122/84 during today's visit. Continue Coreg 25 mg twice daily. He was previously on Amlodipine prior to admission but this was discontinued given episodes of hypotension.  I encouraged him to continue to follow blood pressure at home for now and report back with readings if this was elevated.  5. HLD - followed by PCP. Lipid panel in 2019 showed total cholesterol 142, HDL 53, and LDL 71.  He remains on Crestor 20 mg daily.    6. Stage 3 CKD - creatinine peaked at 1.64 during recent admission, at 1.29 upon discharge. Stable at 1.38 on 11/20/2018. Repeat in 1 week for reassessment of renal function and K+ level.    Medication Adjustments/Labs and Tests Ordered: Current medicines are reviewed at length with the patient today.  Concerns  regarding medicines are outlined above.  Medication changes, Labs and Tests ordered today are listed in the Patient Instructions below. Patient Instructions  Medication Instructions:  Continue current medication regimen. Can take Tessalon Pearls in needed.   Labwork: BMET in  1 week.   Testing/Procedures: None  Follow-Up: Dr. Bronson Ing in 2 months IN OFFICE.   Any Other Special Instructions Will Be Listed Below (If Applicable).   If you need a refill on your cardiac medications before your next appointment, please call your pharmacy.    Signed, Erma Heritage, PA-C  11/21/2018 4:21 PM    [facility] [address] [place], [place] [address] Phone: [phone] Fax: [phone]

## 2018-11-21 NOTE — Patient Instructions (Signed)
Medication Instructions:  Continue current medication regimen. Can take Tessalon Pearls in needed.   Labwork: BMET in 1 week.   Testing/Procedures: None  Follow-Up: Dr. Bronson Ing in 2 months IN OFFICE.   Any Other Special Instructions Will Be Listed Below (If Applicable).     If you need a refill on your cardiac medications before your next appointment, please call your pharmacy.

## 2018-11-24 ENCOUNTER — Other Ambulatory Visit: Payer: Self-pay

## 2018-11-24 NOTE — Patient Outreach (Signed)
[facility]) Care Management  [place]  11/24/2018   DEROY NOAH 1945/06/23 [id number]  Subjective: Outreach for completion of telephonic assessment.   Objective:  Encounter Medications:  Outpatient Encounter Medications as of 11/24/2018  Medication Sig  . acetaminophen (TYLENOL) 500 MG tablet Take 1,000 mg by mouth every 6 (six) hours as needed for moderate pain (thigh pain).  . benzonatate (TESSALON) 100 MG capsule Take by mouth 3 (three) times daily as needed for cough.  . carvedilol (COREG) 25 MG tablet TAKE 1 TABLET (25 MG TOTAL) BY MOUTH 2 (TWO) TIMES DAILY WITH A MEAL.  Marland Kitchen ELIQUIS 5 MG TABS tablet TAKE 1 TABLET TWICE DAILY (Patient taking differently: Take 5 mg by mouth 2 (two) times daily. )  . Magnesium 400 MG CAPS Take 400 mg by mouth daily.   . metFORMIN (GLUCOPHAGE) 500 MG tablet Take 1 tablet (500 mg total) by mouth 2 (two) times daily with a meal.  . Multiple Vitamins-Minerals (MULTIVITAMIN WITH MINERALS) tablet Take 1 tablet by mouth daily.  . potassium chloride SA (KLOR-CON M20) 20 MEQ tablet Take 2 tablets (40 mEq total) by mouth 3 (three) times daily.  . rosuvastatin (CRESTOR) 20 MG tablet Take 1 tablet (20 mg total) by mouth daily.  Marland Kitchen torsemide (DEMADEX) 20 MG tablet Take 3 tablets (60 mg total) by mouth 2 (two) times daily.  . TRESIBA FLEXTOUCH 200 UNIT/ML SOPN INJECT 70 UNITS SUBCUTANEOUSLY AT BEDTIME (Patient taking differently: Inject 70 Units into the skin at bedtime. )  . Dulaglutide (TRULICITY) 1.5 DZ/3.2DJ SOPN Inject 1.5 mg into the skin once a week. (Patient taking differently: Inject 1.5 mg into the skin every Sunday. )  . HYDROcodone-acetaminophen (NORCO/VICODIN) 5-325 MG tablet Take one tablet po every 4 hrs prn pain (Patient taking differently: Take 1 tablet by mouth every 4 (four) hours as needed for moderate pain. )  . metolazone (ZAROXOLYN) 2.5 MG tablet Take 1 tablet (2.5 mg total) by mouth 2 (two) times a week. Monday and  Friday  . Resveratrol 100 MG CAPS Take 100 mg by mouth at bedtime.    No facility-administered encounter medications on file as of 11/24/2018.     Functional Status:  In your present state of health, do you have any difficulty performing the following activities: 11/24/2018 11/11/2018  Hearing? N N  Vision? N N  Difficulty concentrating or making decisions? N N  Walking or climbing stairs? N N  Dressing or bathing? N N  Doing errands, shopping? N N  Preparing Food and eating ? N -  Using the Toilet? N -  In the past six months, have you accidently leaked urine? N -  Do you have problems with loss of bowel control? N -  Managing your Medications? N -  Managing your Finances? N -  Housekeeping or managing your Housekeeping? N -  Some recent data might be hidden    Fall/Depression Screening: Fall Risk  11/24/2018 11/06/2018 12/25/2017  Falls in the past year? 0 0 No  Number falls in past yr: - 0 -  Injury with Fall? - 0 -  Risk for fall due to : Medication side effect - -  Follow up Falls prevention discussed - -   PHQ 2/9 Scores 11/24/2018 11/06/2018 12/25/2017 10/16/2017 07/17/2017 07/17/2017 06/13/2017  PHQ - 2 Score 0 0 0 0 0 0 0    Assessment:  Telephonic assessment complete. Mr. Wildey denies worsening concerns since outreach on 11/20/18. He reports feeling well today.  No complaints of shortness of breath. No chest pain or palpitations. He reports ambulating well and noted decreased swelling to his lower extremities. Denies decline in activity tolerance.  He reports taking all medications as prescribed. Denies concerns regarding medication adherence or prescription costs.  He is attempting to adhere to a diabetic diet and decrease his sodium intake. Reports monitoring his blood glucose levels and weighing daily. He did not have a log available at the time of the call but reports FBS levels usually range in the low 100's.  Morning weight- 238lbs.  Discussed care management needs. Mr.  Guastella reports being independent with ADLs and IADLs. He does not require an assistive device when ambulating. His spouse Lelan Pons is available to assist if needed. Denies need for additional assistance in the home. Reports driving without difficulty. Denies need for transportation or nutritional assistance. Declines current need for community resource referrals. THN CM Care Plan Problem One     Most Recent Value  Care Plan Problem One  Risk for Readmission  Role Documenting the Problem One  Care Management [place] for Problem One  Active  THN Long Term Goal   Over the next 90 days, patient will not be hospitalized.  THN Long Term Goal Start Date  11/24/18  Interventions for Problem One Long Term Goal  Discussed medications, nutrition, weight parameters, activity tolerance and treatment recommendations.   THN CM Short Term Goal #1   Over the next 30 days, patient will attend all scheduled provider appointments.  THN CM Short Term Goal #1 Start Date  11/24/18  Interventions for Short Term Goal #1  Reviewed pending appointments and discussed transportation needs. Patient encouraged to attend appointments as scheduled to prevent delays in care. [Declined need for transportation assistance.]  THN CM Short Term Goal #2   Over the next 30 days, patient will take all medications as prescribed.  THN CM Short Term Goal #2 Start Date  11/24/18  Interventions for Short Term Goal #2  Medications reviewed. Patient encouraged to take as prescribed and notify MD with concerns if unable to tolerate prescribed regimen. Discussed ability to self manage medications.  [Declined need for prescription assistance.]  THN CM Short Term Goal #3  Over the next 30 days, patient will continue to weigh daily and log daily readings.  THN CM Short Term Goal #3 Start Date  11/24/18  Interventions for Short [place]ern Goal #3  Reviewed recent weights. Reinforced instructions regarding weight parameters and indications for  notifying MD. Encouraged to continue to weigh daily and maintain log.  THN CM Short Term Goal #4  Over the next 30 days, patient will monitor FBS daily and record readings.   THN CM Short Term Goal #4 Start Date  11/24/18  Interventions for Short Term Goal #4  Discussed recent FBS ranges and compliance with diabetic diet. Verbalized awareness of s/sx of hypoglycemia and hyperglycemia along with recommended interventions. Encouraged to continue monitoring daily and maintain log to identify trends.      PLAN -Will follow-up next week.   [facility] [phone]

## 2018-11-25 ENCOUNTER — Other Ambulatory Visit: Payer: Self-pay

## 2018-11-25 MED ORDER — BENZONATATE 100 MG PO CAPS: 100.0000 mg | ORAL_CAPSULE | Freq: Three times a day (TID) | ORAL | 1 refills | Status: DC | PRN

## 2018-11-26 ENCOUNTER — Other Ambulatory Visit (HOSPITAL_COMMUNITY)
Admission: RE | Admit: 2018-11-26 | Discharge: 2018-11-26 | Disposition: A | Discharge status: Home / Self Care | Payer: Medicare HMO | Source: Ambulatory Visit | Attending: Student | Admitting: Student

## 2018-11-26 DIAGNOSIS — Z79899 Other long term (current) drug therapy: Secondary | ICD-10-CM | POA: Diagnosis not present

## 2018-11-26 LAB — BASIC METABOLIC PANEL
Anion gap: 10 (ref 5–15)
BUN: 21 mg/dL (ref 8–23)
CO2: 33 mmol/L — ABNORMAL HIGH (ref 22–32)
Calcium: 9.2 mg/dL (ref 8.9–10.3)
Chloride: 91 mmol/L — ABNORMAL LOW (ref 98–111)
Creatinine, Ser: 1.32 mg/dL — ABNORMAL HIGH (ref 0.61–1.24)
GFR calc Af Amer: >60 mL/min (ref >60)
GFR calc non Af Amer: 54 mL/min — ABNORMAL LOW (ref >60)
Glucose, Bld: 117 mg/dL — ABNORMAL HIGH (ref 70–99)
Potassium: 3.2 mmol/L — ABNORMAL LOW (ref 3.5–5.1)
Sodium: 134 mmol/L — ABNORMAL LOW (ref 135–145)

## 2018-11-27 ENCOUNTER — Other Ambulatory Visit: Payer: Self-pay

## 2018-11-27 MED ORDER — BENZONATATE 100 MG PO CAPS: 100.0000 mg | ORAL_CAPSULE | Freq: Three times a day (TID) | ORAL | 1 refills | Status: DC | PRN

## 2018-11-28 ENCOUNTER — Telehealth: Payer: Self-pay | Admitting: *Deleted

## 2018-11-28 MED ORDER — POTASSIUM CHLORIDE CRYS ER 20 MEQ PO TBCR: EXTENDED_RELEASE_TABLET | ORAL | 6 refills | Status: DC

## 2018-11-28 NOTE — Telephone Encounter (Signed)
-----   Message from Erma Heritage, [place] sent at 11/26/2018  1:31 PM EDT ----- Please let the patient know that his renal function remains stable with creatinine at 1.32.  Potassium remains slightly low at 3.2.  He is listed as taking K-Dur 80 mg daily at the time of his last office visit but now listed in his MAR as 120 mEq daily. Please record his correct dosing and update his medication list. Would increase current dosing by 20 mEq and recheck BMET in 2-3 weeks.

## 2018-12-03 ENCOUNTER — Other Ambulatory Visit: Payer: Self-pay

## 2018-12-04 ENCOUNTER — Other Ambulatory Visit: Payer: Self-pay

## 2018-12-04 NOTE — Patient Outreach (Signed)
[facility]) Care Management  12/04/2018  JASSIAH VIVIANO [birth year] [id number]    Current Outpatient Medications on File Prior to Visit  Medication Sig Dispense Refill  . acetaminophen (TYLENOL) 500 MG tablet Take 1,000 mg by mouth every 6 (six) hours as needed for moderate pain (thigh pain).    . benzonatate (TESSALON) 100 MG capsule Take 1 capsule (100 mg total) by mouth 3 (three) times daily as needed for cough. 90 capsule 1  . carvedilol (COREG) 25 MG tablet TAKE 1 TABLET (25 MG TOTAL) BY MOUTH 2 (TWO) TIMES DAILY WITH A MEAL. 180 tablet 3  . Dulaglutide (TRULICITY) 1.5 QM/5.7QI SOPN Inject 1.5 mg into the skin once a week. (Patient taking differently: Inject 1.5 mg into the skin every Sunday. ) 12 pen 0  . ELIQUIS 5 MG TABS tablet TAKE 1 TABLET TWICE DAILY (Patient taking differently: Take 5 mg by mouth 2 (two) times daily. ) 180 tablet 3  . HYDROcodone-acetaminophen (NORCO/VICODIN) 5-325 MG tablet Take one tablet po every 4 hrs prn pain (Patient taking differently: Take 1 tablet by mouth every 4 (four) hours as needed for moderate pain. ) 28 tablet 0  . Magnesium 400 MG CAPS Take 400 mg by mouth daily.     . metFORMIN (GLUCOPHAGE) 500 MG tablet Take 1 tablet (500 mg total) by mouth 2 (two) times daily with a meal. 180 tablet 1  . metolazone (ZAROXOLYN) 2.5 MG tablet Take 1 tablet (2.5 mg total) by mouth 2 (two) times a week. Monday and Friday 15 tablet 1  . Multiple Vitamins-Minerals (MULTIVITAMIN WITH MINERALS) tablet Take 1 tablet by mouth daily.    . potassium chloride SA (K-DUR) 20 MEQ tablet Take 60 meq in the AM, Take 40 meq at lunch and Take 40 meq in the PM 210 tablet 6  . Resveratrol 100 MG CAPS Take 100 mg by mouth at bedtime.     . rosuvastatin (CRESTOR) 20 MG tablet Take 1 tablet (20 mg total) by mouth daily. 90 tablet 1  . torsemide (DEMADEX) 20 MG tablet Take 3 tablets (60 mg total) by mouth 2 (two) times daily. 540 tablet 0  . TRESIBA FLEXTOUCH 200  UNIT/ML SOPN INJECT 70 UNITS SUBCUTANEOUSLY AT BEDTIME (Patient taking differently: Inject 70 Units into the skin at bedtime. ) 36 mL 0   No current facility-administered medications on file prior to visit.     Follow-up outreach with Mr. Babson. He reports doing very well today. Denies complaints of shortness of breath. Denies chest discomfort or palpitations. Denies increased edema to his abdomen or lower extremities. Reports ambulating well. No changes in activity tolerance.  Discussed recent potassium levels and Cardiology instructions to increase K-Dur. He verbalized awareness and reports taking medications as prescribed. Reports the following morning readings:   Weight-225lbs     FBS-29m/dl  Mr. TMitrodenies urgent concerns or changes in care management needs. He plans to complete follow-up labs within the next two weeks. Pending outreach with Endocrinologist on 12/23/18.  PLAN -Will follow-up later this month.  [facility] [phone]

## 2018-12-05 ENCOUNTER — Ambulatory Visit: Payer: Medicare HMO | Admitting: Cardiovascular Disease

## 2018-12-09 ENCOUNTER — Ambulatory Visit: Payer: Medicare HMO

## 2018-12-15 ENCOUNTER — Other Ambulatory Visit (HOSPITAL_COMMUNITY)
Admission: RE | Admit: 2018-12-15 | Discharge: 2018-12-15 | Disposition: A | Discharge status: Home / Self Care | Payer: Medicare HMO | Source: Ambulatory Visit | Attending: "Endocrinology | Admitting: "Endocrinology

## 2018-12-15 DIAGNOSIS — E1159 Type 2 diabetes mellitus with other circulatory complications: Secondary | ICD-10-CM | POA: Insufficient documentation

## 2018-12-15 LAB — COMPREHENSIVE METABOLIC PANEL
ALT: 36 U/L (ref 0–44)
AST: 57 U/L — ABNORMAL HIGH (ref 15–41)
Albumin: 3.4 g/dL — ABNORMAL LOW (ref 3.5–5.0)
Alkaline Phosphatase: 207 U/L — ABNORMAL HIGH (ref 38–126)
Anion gap: 17 — ABNORMAL HIGH (ref 5–15)
BUN: 30 mg/dL — ABNORMAL HIGH (ref 8–23)
CO2: 30 mmol/L (ref 22–32)
Calcium: 9.4 mg/dL (ref 8.9–10.3)
Chloride: 87 mmol/L — ABNORMAL LOW (ref 98–111)
Creatinine, Ser: 1.39 mg/dL — ABNORMAL HIGH (ref 0.61–1.24)
GFR calc Af Amer: 58 mL/min — ABNORMAL LOW (ref >60)
GFR calc non Af Amer: 50 mL/min — ABNORMAL LOW (ref >60)
Glucose, Bld: 160 mg/dL — ABNORMAL HIGH (ref 70–99)
Potassium: 3 mmol/L — ABNORMAL LOW (ref 3.5–5.1)
Sodium: 134 mmol/L — ABNORMAL LOW (ref 135–145)
Total Bilirubin: 2.9 mg/dL — ABNORMAL HIGH (ref 0.3–1.2)
Total Protein: 8.4 g/dL — ABNORMAL HIGH (ref 6.5–8.1)

## 2018-12-15 LAB — HEMOGLOBIN A1C
Hgb A1c MFr Bld: 8.3 % — ABNORMAL HIGH (ref 4.8–5.6)
Mean Plasma Glucose: 191.51 mg/dL

## 2018-12-18 ENCOUNTER — Other Ambulatory Visit: Payer: Self-pay

## 2018-12-18 NOTE — Patient Outreach (Signed)
[facility]) Care Management  12/18/2018  John [name] 07/27/1945 [id number]   Returned call to Mr. Scollard. Discussed current weight and CHF parameters. Confirmed that morning weight was within range at 224lbs. Stated weight was 227lbs on yesterday. He denied complaints of shortness of breath. Denied increased edema to his abdomen or lower extremities.   He completed labs on 12/15/18 in preparation for his Endocrinology appointment on 12/23/18. Wanted to confirm that Cardiology did not require additional labs this week. Per chart review, potassium was still slightly low at 3.0. He reports taking a total of 160 mEq of K-Dur daily. Message forwarded to Cardiology. He was informed of instructions to repeat BMET in two weeks. No medication adjustments at this time.  PLAN -Will follow-up as scheduled next week.  [facility] [phone]

## 2018-12-19 ENCOUNTER — Other Ambulatory Visit: Payer: Self-pay

## 2018-12-20 ENCOUNTER — Other Ambulatory Visit (INDEPENDENT_AMBULATORY_CARE_PROVIDER_SITE_OTHER): Payer: Medicare HMO

## 2018-12-20 DIAGNOSIS — Z23 Encounter for immunization: Secondary | ICD-10-CM | POA: Diagnosis not present

## 2018-12-23 ENCOUNTER — Other Ambulatory Visit: Payer: Self-pay

## 2018-12-23 ENCOUNTER — Ambulatory Visit (INDEPENDENT_AMBULATORY_CARE_PROVIDER_SITE_OTHER): Payer: Medicare HMO | Admitting: "Endocrinology

## 2018-12-23 ENCOUNTER — Encounter: Payer: Self-pay | Admitting: "Endocrinology

## 2018-12-23 DIAGNOSIS — I10 Essential (primary) hypertension: Secondary | ICD-10-CM | POA: Diagnosis not present

## 2018-12-23 DIAGNOSIS — E782 Mixed hyperlipidemia: Secondary | ICD-10-CM | POA: Diagnosis not present

## 2018-12-23 DIAGNOSIS — E1159 Type 2 diabetes mellitus with other circulatory complications: Secondary | ICD-10-CM

## 2018-12-23 MED ORDER — TRESIBA FLEXTOUCH 200 UNIT/ML ~~LOC~~ SOPN: 70.0000 [IU] | PEN_INJECTOR | Freq: Every day | SUBCUTANEOUS | 2 refills | Status: DC

## 2018-12-23 MED ORDER — BD PEN NEEDLE NANO U/F 32G X 4 MM MISC: 1.0000 | Freq: Four times a day (QID) | 5 refills | Status: AC

## 2018-12-23 NOTE — Progress Notes (Signed)
12/23/2018, 10:47 AM                                                       Endocrinology Telehealth Visit Follow up Note -During COVID -19 Pandemic  This visit type was conducted due to national recommendations for restrictions regarding the COVID-19 Pandemic  in an effort to limit this patient's exposure and mitigate transmission of the corona virus.  Due to his co-morbid illnesses, John [name] is at  moderate to high risk for complications without adequate follow up.  This format is felt to be most appropriate for him at this time.  I connected with this patient on 12/23/2018   by telephone and verified that I am speaking with the correct person using two identifiers. John [name], Jun 11, 1945. he has verbally consented to this visit. All issues noted in this document were discussed and addressed. The format was not optimal for physical exam.    Subjective:    Patient ID: John [name], male    DOB: 08/29/1945.  John [name] is engaged in a telehealth via telephone for the management of his currently uncontrolled type 2 diabetes, hyperlipidemia.    PMD:  Mikey Kirschner, MD.   Past Medical History:  Diagnosis Date  . Atrial flutter ([place]) 12/2010   Admitted with symptomatic bradycardia (HR 40s), atrial flutter with slow ventricular response 12/2010 + volume overload; AV nodal agents d/c'd and Pradaxa started; RFA in 01/2011  . CHF (congestive heart failure) ([place])   . Chronic combined systolic and diastolic heart failure ([facility])    a. echo 01/06/11: mild LVH, EF 65-70%, mod to severe LAE, mild RVE, mild RAE, PASP 32;   TEE 10/12: EF 45-50% b. EF 45 to 50% by echo in 07/2018  . Class 2 severe obesity due to excess calories with serious comorbidity and body mass index (BMI) of 37.0 to 37.9 in adult ([place]) 07/17/2017  . Diabetes mellitus    non insulin dependant  . Hyperlipidemia   .  Hypertension   . Osteoarthritis   . Presence of permanent cardiac pacemaker   . PSVT (paroxysmal supraventricular tachycardia) (HCC)    Possibly atrial flutter   Past Surgical History:  Procedure Laterality Date  . ATRIAL FLUTTER ABLATION N/A 02/07/2011   Procedure: ATRIAL FLUTTER ABLATION;  Surgeon: Evans Lance, MD;  Location: [facility] CATH LAB;  Service: Cardiovascular;  Laterality: N/A;  . CARDIAC ELECTROPHYSIOLOGY STUDY AND ABLATION  02/07/11  . CARDIOVERSION N/A 03/23/2016   Procedure: CARDIOVERSION;  Surgeon: Evans Lance, MD;  Location: [facility];  Service: Cardiovascular;  Laterality: N/A;  . COLONOSCOPY N/A 04/17/2017   Procedure: COLONOSCOPY;  Surgeon: Rogene Houston, MD;  [facility];  Service: Endoscopy;  Laterality: N/A;  830  . EP IMPLANTABLE DEVICE N/A 08/29/2015   Procedure: Pacemaker Implant;  Surgeon: Evans Lance, MD;  Location: [facility] CV LAB;  Service: Cardiovascular;  Laterality: N/A;  . EP IMPLANTABLE DEVICE N/A 12/07/2015   Procedure: PPM Lead Revision/Repair;  Surgeon: Evans Lance, MD;  Location: [facility] CV LAB;  Service: Cardiovascular;  Laterality: N/A;  . KNEE ARTHROSCOPY  2005   left  . LUMBAR SPINE SURGERY     "I've had 6 ORs 1972 thru 2004"  . POLYPECTOMY  04/17/2017   Procedure: POLYPECTOMY;  Surgeon: Rogene Houston, MD;  [facility];  Service: Endoscopy;;  transverse colon x3;   Social History   Socioeconomic History  . Marital status: Married    Spouse name: Not on file  . Number of children: Not on file  . Years of education: Not on file  . Highest education level: Not on file  Occupational History  . Occupation: Immunologist: RETIRED  Social Needs  . Financial resource strain: Not hard at all  . Food insecurity    Worry: Never true    Inability: Never true  . Transportation needs    Medical: No    Non-medical: No  Tobacco Use  . Smoking status: Former Smoker    Packs/day: 1.00     Years: 10.00    Pack years: 10.00    Types: Cigarettes    Quit date: 03/26/1986    Years since quitting: 32.7  . Smokeless tobacco: Never Used  . Tobacco comment: "stopped cigarette  smoking 1988"  Substance and Sexual Activity  . Alcohol use: Never    Alcohol/week: 0.0 standard drinks    Frequency: Never    Comment: "quit alcohol ~ 2007"  . Drug use: No  . Sexual activity: Yes    Partners: Female  Lifestyle  . Physical activity    Days per week: 2 days    Minutes per session: 20 min  . Stress: Not at all  Relationships  . Social connections    Talks on phone: More than three times a week    Gets together: More than three times a week    Attends religious service: More than 4 times per year    Active member of club or organization: Yes    Attends meetings of clubs or organizations: More than 4 times per year    Relationship status: Married  Other Topics Concern  . Not on file  Social History Narrative  . Not on file   Outpatient Encounter Medications as of 12/23/2018  Medication Sig  . acetaminophen (TYLENOL) 500 MG tablet Take 1,000 mg by mouth every 6 (six) hours as needed for moderate pain (thigh pain).  . benzonatate (TESSALON) 100 MG capsule Take 1 capsule (100 mg total) by mouth 3 (three) times daily as needed for cough.  . carvedilol (COREG) 25 MG tablet TAKE 1 TABLET (25 MG TOTAL) BY MOUTH 2 (TWO) TIMES DAILY WITH A MEAL.  . Dulaglutide (TRULICITY) 1.5 WP/8.0DX SOPN Inject 1.5 mg into the skin once a week. (Patient taking differently: Inject 1.5 mg into the skin every Sunday. )  . ELIQUIS 5 MG TABS tablet TAKE 1 TABLET TWICE DAILY (Patient taking differently: Take 5 mg by mouth 2 (two) times daily. )  . HYDROcodone-acetaminophen (NORCO/VICODIN) 5-325 MG tablet Take one tablet po every 4 hrs prn pain (Patient taking differently: Take 1 tablet by mouth every 4 (four) hours as needed for moderate pain. )  . Insulin Degludec (TRESIBA FLEXTOUCH) 200 UNIT/ML SOPN Inject 70  Units into the skin at bedtime.  . Insulin Pen Needle (BD PEN NEEDLE NANO  U/F) 32G X 4 MM MISC 1 each by Does not apply route 4 (four) times daily.  . Magnesium 400 MG CAPS Take 400 mg by mouth daily.   . metFORMIN (GLUCOPHAGE) 500 MG tablet Take 1 tablet (500 mg total) by mouth 2 (two) times daily with a meal.  . metolazone (ZAROXOLYN) 2.5 MG tablet Take 1 tablet (2.5 mg total) by mouth 2 (two) times a week. Monday and Friday  . Multiple Vitamins-Minerals (MULTIVITAMIN WITH MINERALS) tablet Take 1 tablet by mouth daily.  . potassium chloride SA (K-DUR) 20 MEQ tablet Take 60 meq in the AM, Take 40 meq at lunch and Take 40 meq in the PM  . Resveratrol 100 MG CAPS Take 100 mg by mouth at bedtime.   . rosuvastatin (CRESTOR) 20 MG tablet Take 1 tablet (20 mg total) by mouth daily.  Marland Kitchen torsemide (DEMADEX) 20 MG tablet Take 3 tablets (60 mg total) by mouth 2 (two) times daily.  . [DISCONTINUED] TRESIBA FLEXTOUCH 200 UNIT/ML SOPN INJECT 70 UNITS SUBCUTANEOUSLY AT BEDTIME (Patient taking differently: Inject 70 Units into the skin at bedtime. )   No facility-administered encounter medications on file as of 12/23/2018.     ALLERGIES: No Known Allergies  VACCINATION STATUS: Immunization History  Administered Date(s) Administered  . Influenza,inj,Quad PF,6+ Mos 11/24/2014, 11/29/2015, 12/11/2016, 12/25/2017, 12/20/2018  . Influenza-Unspecified 11/24/2013  . Pneumococcal Conjugate-13 11/19/2013  . Pneumococcal Polysaccharide-23 11/29/2015  . Pneumococcal-Unspecified 03/24/2009  . Td 06/23/2010    Diabetes He presents for his follow-up (Telephone visit due to coronavirus pandemic) diabetic visit. He has type 2 diabetes mellitus. Onset time: He was diagnosed at approximate age of 51 years. His disease course has been fluctuating. There are no hypoglycemic associated symptoms. Pertinent negatives for hypoglycemia include no confusion, pallor or seizures. There are no diabetic associated symptoms.  Pertinent negatives for diabetes include no fatigue, no polydipsia, no polyphagia, no polyuria and no weakness. There are no hypoglycemic complications. Symptoms are improving. Diabetic complications include heart disease and nephropathy. Risk factors for coronary artery disease include diabetes mellitus, dyslipidemia, family history, obesity, male sex, hypertension, sedentary lifestyle and tobacco exposure. Current diabetic treatment includes insulin injections. He is following a generally unhealthy diet. When asked about meal planning, he reported none. He has not had a previous visit with a dietitian. He never participates in exercise. His home blood glucose trend is decreasing steadily. His breakfast blood glucose range is generally 140-180 mg/dl. His bedtime blood glucose range is generally 140-180 mg/dl. His overall blood glucose range is 140-180 mg/dl. An ACE inhibitor/angiotensin II receptor blocker is being taken. He does not see a podiatrist.Eye exam is current.  Hyperlipidemia This is a chronic problem. The current episode started more than 1 year ago. The problem is controlled. Exacerbating diseases include diabetes and obesity. Pertinent negatives include no myalgias. Current antihyperlipidemic treatment includes statins. Risk factors for coronary artery disease include diabetes mellitus, dyslipidemia, family history, obesity, male sex, hypertension and a sedentary lifestyle.    Objective:      CMP     Component Value Date/Time   NA 134 (L) 12/15/2018 1014   NA 138 09/12/2018 0857   K 3.0 (L) 12/15/2018 1014   CL 87 (L) 12/15/2018 1014   CO2 30 12/15/2018 1014   GLUCOSE 160 (H) 12/15/2018 1014   BUN 30 (H) 12/15/2018 1014   BUN 26 09/12/2018 0857   CREATININE 1.39 (H) 12/15/2018 1014   CREATININE 1.32 (H) 03/07/2016 1043   CALCIUM 9.4 12/15/2018 1014  PROT 8.4 (H) 12/15/2018 1014   PROT 8.0 09/12/2018 0857   ALBUMIN 3.4 (L) 12/15/2018 1014   ALBUMIN 4.1 09/12/2018 0857   AST  57 (H) 12/15/2018 1014   ALT 36 12/15/2018 1014   ALKPHOS 207 (H) 12/15/2018 1014   BILITOT 2.9 (H) 12/15/2018 1014   BILITOT 1.6 (H) 09/12/2018 0857   GFRNONAA 50 (L) 12/15/2018 1014   GFRAA 58 (L) 12/15/2018 1014     Diabetic Labs (most recent): Lab Results  Component Value Date   HGBA1C 8.3 (H) 12/15/2018   HGBA1C 7.8 (H) 09/12/2018   HGBA1C 6.8 (H) 08/19/2018     Lipid Panel     Component Value Date/Time   CHOL 142 01/17/2018 0835   TRIG 90 01/17/2018 0835   HDL 53 01/17/2018 0835   CHOLHDL 2.7 01/17/2018 0835   CHOLHDL 3.2 11/16/2013 0702   VLDL 45 (H) 11/16/2013 0702   LDLCALC 71 01/17/2018 0835      Lab Results  Component Value Date   TSH 1.940 07/10/2017   TSH 1.563 08/25/2015   TSH 1.206 01/05/2011   FREET4 1.12 07/10/2017        Assessment & Plan:   1. DM type 2 causing vascular disease ([place])  - John [name] has currently uncontrolled symptomatic type 2 DM since 73 years of age. -His previsit labs show A1c of 8.3%, slowly increasing from 7.5%.   -He was recently hospitalized for CHF exacerbation, was discharged on diuretics and potassium supplement.     -He does not report any major hypoglycemia. Recent labs reviewed.  -his diabetes is complicated by obesity/sedentary life , CKD, CHF , congestive heart failure/bradyarrhythmia and John [name] remains at a high risk for more acute and chronic complications which include CAD, CVA, CKD, retinopathy, and neuropathy. These are all discussed in detail with the patient.  - I encouraged him to switch to  unprocessed or minimally processed complex starch and increased protein intake (animal or plant source), fruits, and vegetables.  - he is advised to stick to a routine mealtimes to eat 3 meals  a day and avoid unnecessary snacks ( to snack only to correct hypoglycemia).    - I have approached him with the following individualized plan to manage diabetes and patient agrees:   -Based on his  current glycemic response, he will not require prandial insulin for now.   -He is reporting slightly above target fasting glycemic profile, he is advised to increase his [place] to 70 units nightly, continue metformin 500 mg p.o. twice daily, continue Trulicity 1.5 mg subcutaneously weekly.    - he is advised to continue  strict monitoring of blood glucose 2 times a day-daily before breakfast and at bedtime. - Patient is warned not to take insulin without proper monitoring per orders.  -Patient is encouraged to call clinic for blood glucose levels less than 70 or above 200 mg /dl.  He will not tolerate higher dose of metformin due to CHF and recent acute on chronic renal insufficiency.   2) Lipids/HPL:    His recent lipid panel shows controlled LDL at 53.  He will continue to benefit from statin treatment, advised to continue Crestor 20 mg p.o. nightly.     3) hypertension- he is advised to home monitor blood pressure and report if > 140/90 on 2 separate readings. -He is advised to continue on his torsemide as needed, carvedilol 25 mg p.o. twice daily.  He is advised to continue follow-up with his cardiologist.  -  I advised patient to maintain close follow up with Mikey Kirschner, MD for primary care needs.  - Patient Care Time Today:  25 min, of which >50% was spent in  counseling and the rest reviewing his  current and  previous labs/studies, previous treatments, his blood glucose readings, and medications' doses and developing a plan for long-term care based on the latest recommendations for standards of care.   John [name] participated in the discussions, expressed understanding, and voiced agreement with the above plans.  All questions were answered to his satisfaction. he is encouraged to contact clinic should he have any questions or concerns prior to his return visit.   Follow up plan: - Return in about 4 weeks (around 01/20/2019) for Follow up with Meter and Logs Only - no  Labs, Include 8 log sheets.  Glade Lloyd, MD [facility] [address] [place], [place] [address] Phone: [phone]  Fax: [phone]    12/23/2018, 10:47 AM  This note was partially dictated with voice recognition software. Similar sounding words can be transcribed inadequately or may not  be corrected upon review.

## 2018-12-24 ENCOUNTER — Other Ambulatory Visit: Payer: Self-pay

## 2018-12-24 NOTE — Patient Outreach (Signed)
[facility]) Care Management  12/24/2018  RINALDO MACQUEEN [birth year] [id number]  Follow-up outreach with Mr. Chou. He reports doing very well today. He attended his Endocrinology appointment as scheduled on 12/23/18.  Reports compliance with medications and treatment recommendations. He reports increasing Tresiba to 70 units at night as recommended. Dr. Dorris Fetch encouraged him to exercise. He does not engage in strenuous exercise but attempt to walk several times in his driveway. Reports the following morning readings: Weight-222lbs              FBS-169m/dl  Denies new or urgent concerns. He is aware of pending labs on next week. Current Outpatient Medications on File Prior to Visit  Medication Sig Dispense Refill  . acetaminophen (TYLENOL) 500 MG tablet Take 1,000 mg by mouth every 6 (six) hours as needed for moderate pain (thigh pain).    . benzonatate (TESSALON) 100 MG capsule Take 1 capsule (100 mg total) by mouth 3 (three) times daily as needed for cough. 90 capsule 1  . carvedilol (COREG) 25 MG tablet TAKE 1 TABLET (25 MG TOTAL) BY MOUTH 2 (TWO) TIMES DAILY WITH A MEAL. 180 tablet 3  . Dulaglutide (TRULICITY) 1.5 MZL/9.3TTSOPN Inject 1.5 mg into the skin once a week. (Patient taking differently: Inject 1.5 mg into the skin every Sunday. ) 12 pen 0  . ELIQUIS 5 MG TABS tablet TAKE 1 TABLET TWICE DAILY (Patient taking differently: Take 5 mg by mouth 2 (two) times daily. ) 180 tablet 3  . HYDROcodone-acetaminophen (NORCO/VICODIN) 5-325 MG tablet Take one tablet po every 4 hrs prn pain (Patient taking differently: Take 1 tablet by mouth every 4 (four) hours as needed for moderate pain. ) 28 tablet 0  . Insulin Degludec (TRESIBA FLEXTOUCH) 200 UNIT/ML SOPN Inject 70 Units into the skin at bedtime. 2 pen 2  . Insulin Pen Needle (BD PEN NEEDLE NANO U/F) 32G X 4 MM MISC 1 each by Does not apply route 4 (four) times daily. 150 each 5  . Magnesium 400 MG CAPS Take 400 mg by mouth  daily.     . metFORMIN (GLUCOPHAGE) 500 MG tablet Take 1 tablet (500 mg total) by mouth 2 (two) times daily with a meal. 180 tablet 1  . metolazone (ZAROXOLYN) 2.5 MG tablet Take 1 tablet (2.5 mg total) by mouth 2 (two) times a week. Monday and Friday 15 tablet 1  . Multiple Vitamins-Minerals (MULTIVITAMIN WITH MINERALS) tablet Take 1 tablet by mouth daily.    . potassium chloride SA (K-DUR) 20 MEQ tablet Take 60 meq in the AM, Take 40 meq at lunch and Take 40 meq in the PM 210 tablet 6  . Resveratrol 100 MG CAPS Take 100 mg by mouth at bedtime.     . rosuvastatin (CRESTOR) 20 MG tablet Take 1 tablet (20 mg total) by mouth daily. 90 tablet 1  . torsemide (DEMADEX) 20 MG tablet Take 3 tablets (60 mg total) by mouth 2 (two) times daily. 540 tablet 0   No current facility-administered medications on file prior to visit.    PLAN -Will continue routine outreach.  [facility] [phone]

## 2019-01-01 ENCOUNTER — Other Ambulatory Visit: Payer: Self-pay

## 2019-01-01 DIAGNOSIS — I10 Essential (primary) hypertension: Secondary | ICD-10-CM

## 2019-01-01 DIAGNOSIS — I5032 Chronic diastolic (congestive) heart failure: Secondary | ICD-10-CM

## 2019-01-01 NOTE — Patient Outreach (Signed)
[facility]) Care Management  01/01/2019  John [name] 02-13-1946 [id number]   [name] reports that he will have bloodwork drawn at the [facility] lab on tomorrow. Order submitted for repeat BMET.  [facility] [phone]

## 2019-01-02 ENCOUNTER — Telehealth: Payer: Self-pay

## 2019-01-02 ENCOUNTER — Other Ambulatory Visit (HOSPITAL_COMMUNITY)
Admission: RE | Admit: 2019-01-02 | Discharge: 2019-01-02 | Disposition: A | Discharge status: Home / Self Care | Payer: Medicare HMO | Source: Ambulatory Visit | Attending: Student | Admitting: Student

## 2019-01-02 ENCOUNTER — Other Ambulatory Visit: Payer: Self-pay | Admitting: Student

## 2019-01-02 DIAGNOSIS — I10 Essential (primary) hypertension: Secondary | ICD-10-CM | POA: Diagnosis not present

## 2019-01-02 DIAGNOSIS — I5032 Chronic diastolic (congestive) heart failure: Secondary | ICD-10-CM | POA: Diagnosis not present

## 2019-01-02 DIAGNOSIS — E876 Hypokalemia: Secondary | ICD-10-CM

## 2019-01-02 LAB — BASIC METABOLIC PANEL
Anion gap: 14 (ref 5–15)
BUN: 28 mg/dL — ABNORMAL HIGH (ref 8–23)
CO2: 28 mmol/L (ref 22–32)
Calcium: 9.3 mg/dL (ref 8.9–10.3)
Chloride: 93 mmol/L — ABNORMAL LOW (ref 98–111)
Creatinine, Ser: 1.16 mg/dL (ref 0.61–1.24)
GFR calc Af Amer: >60 mL/min (ref >60)
GFR calc non Af Amer: >60 mL/min (ref >60)
Glucose, Bld: 132 mg/dL — ABNORMAL HIGH (ref 70–99)
Potassium: 3.1 mmol/L — ABNORMAL LOW (ref 3.5–5.1)
Sodium: 135 mmol/L (ref 135–145)

## 2019-01-02 MED ORDER — POTASSIUM CHLORIDE CRYS ER 20 MEQ PO TBCR: EXTENDED_RELEASE_TABLET | ORAL | 6 refills | Status: DC

## 2019-01-02 NOTE — Telephone Encounter (Signed)
-----   Message from Erma Heritage, [place] sent at 01/02/2019 11:32 AM EDT ----- Please let the patient know his kidney function has actually improved as creatinine was previously 1.39 and is now down to 1.16. K+ remains low despite significant K+ supplementation. He is listed as taking 60 mEq in AM/40 mEq at lunch/40 mEq in evening. Would go to 60 mEq TID. Please send in updated Rx as he was previously having issues getting this filled. Repeat BMET in 3-4 weeks.

## 2019-01-02 NOTE — Telephone Encounter (Signed)
Patient will increase Potassium to 60 meq TID, I mailed lab slip to him.

## 2019-01-05 ENCOUNTER — Other Ambulatory Visit: Payer: Self-pay | Admitting: "Endocrinology

## 2019-01-15 ENCOUNTER — Ambulatory Visit (INDEPENDENT_AMBULATORY_CARE_PROVIDER_SITE_OTHER): Payer: Medicare HMO | Admitting: *Deleted

## 2019-01-15 DIAGNOSIS — I442 Atrioventricular block, complete: Secondary | ICD-10-CM

## 2019-01-15 DIAGNOSIS — I480 Paroxysmal atrial fibrillation: Secondary | ICD-10-CM

## 2019-01-15 LAB — CUP PACEART REMOTE DEVICE CHECK
Battery Impedance: 183 Ohm
Battery Remaining Longevity: 107 mo
Battery Voltage: 2.79 V
Brady Statistic AP VP Percent: 16 %
Brady Statistic AP VS Percent: 0 %
Brady Statistic AS VP Percent: 32 %
Brady Statistic AS VS Percent: 52 %
Date Time Interrogation Session: 20201022111114
Implantable Lead Implant Date: 20170605
Implantable Lead Implant Date: 20170605
Implantable Lead Location: 753859
Implantable Lead Location: 753860
Implantable Lead Model: 5076
Implantable Lead Model: 5076
Implantable Pulse Generator Implant Date: 20170605
Lead Channel Impedance Value: 417 Ohm
Lead Channel Impedance Value: 517 Ohm
Lead Channel Pacing Threshold Amplitude: 0.75 V
Lead Channel Pacing Threshold Amplitude: 0.875 V
Lead Channel Pacing Threshold Pulse Width: 0.4 ms
Lead Channel Pacing Threshold Pulse Width: 0.4 ms
Lead Channel Setting Pacing Amplitude: 2 V
Lead Channel Setting Pacing Amplitude: 2.5 V
Lead Channel Setting Pacing Pulse Width: 0.4 ms
Lead Channel Setting Sensing Sensitivity: 4 mV

## 2019-01-21 ENCOUNTER — Other Ambulatory Visit: Payer: Self-pay

## 2019-01-21 ENCOUNTER — Ambulatory Visit (INDEPENDENT_AMBULATORY_CARE_PROVIDER_SITE_OTHER): Payer: Medicare HMO | Admitting: "Endocrinology

## 2019-01-21 ENCOUNTER — Encounter: Payer: Self-pay | Admitting: "Endocrinology

## 2019-01-21 DIAGNOSIS — E782 Mixed hyperlipidemia: Secondary | ICD-10-CM

## 2019-01-21 DIAGNOSIS — E1159 Type 2 diabetes mellitus with other circulatory complications: Secondary | ICD-10-CM | POA: Diagnosis not present

## 2019-01-21 DIAGNOSIS — I10 Essential (primary) hypertension: Secondary | ICD-10-CM | POA: Diagnosis not present

## 2019-01-21 NOTE — Progress Notes (Signed)
01/21/2019, 2:52 PM                                                       Endocrinology Telehealth Visit Follow up Note -During COVID -19 Pandemic  This visit type was conducted due to national recommendations for restrictions regarding the COVID-19 Pandemic  in an effort to limit this patient's exposure and mitigate transmission of the corona virus.  Due to his co-morbid illnesses, [name] is at  moderate to high risk for complications without adequate follow up.  This format is felt to be most appropriate for him at this time.  I connected with this patient on 01/21/2019   by telephone and verified that I am speaking with the correct person using two identifiers. John [name], 01/04/1946. he has verbally consented to this visit. All issues noted in this document were discussed and addressed. The format was not optimal for physical exam.    Subjective:    Patient ID: John [name], male    DOB: 11-11-1945.  John [name] is engaged in a telehealth via telephone for the management of his currently uncontrolled type 2 diabetes, hyperlipidemia.    PMD:  Mikey Kirschner, MD.   Past Medical History:  Diagnosis Date  . Atrial flutter ([place]) 12/2010   Admitted with symptomatic bradycardia (HR 40s), atrial flutter with slow ventricular response 12/2010 + volume overload; AV nodal agents d/c'd and Pradaxa started; RFA in 01/2011  . CHF (congestive heart failure) ([place])   . Chronic combined systolic and diastolic heart failure ([place])    a. echo 01/06/11: mild LVH, EF 65-70%, mod to severe LAE, mild RVE, mild RAE, PASP 32;   TEE 10/12: EF 45-50% b. EF 45 to 50% by echo in 07/2018  . Class 2 severe obesity due to excess calories with serious comorbidity and body mass index (BMI) of 37.0 to 37.9 in adult ([place]) 07/17/2017  . Diabetes mellitus    non insulin dependant  . Hyperlipidemia   .  Hypertension   . Osteoarthritis   . Presence of permanent cardiac pacemaker   . PSVT (paroxysmal supraventricular tachycardia) (HCC)    Possibly atrial flutter   Past Surgical History:  Procedure Laterality Date  . ATRIAL FLUTTER ABLATION N/A 02/07/2011   Procedure: ATRIAL FLUTTER ABLATION;  Surgeon: Evans Lance, MD;  Location: [facility] CATH LAB;  Service: Cardiovascular;  Laterality: N/A;  . CARDIAC ELECTROPHYSIOLOGY STUDY AND ABLATION  02/07/11  . CARDIOVERSION N/A 03/23/2016   Procedure: CARDIOVERSION;  Surgeon: Evans Lance, MD;  Location: [facility];  Service: Cardiovascular;  Laterality: N/A;  . COLONOSCOPY N/A 04/17/2017   Procedure: COLONOSCOPY;  Surgeon: Rogene Houston, MD;  Location: [facility];  Service: Endoscopy;  Laterality: N/A;  830  . EP IMPLANTABLE DEVICE N/A 08/29/2015   Procedure: Pacemaker Implant;  Surgeon: Evans Lance, MD;  Location: [facility] CV LAB;  Service: Cardiovascular;  Laterality: N/A;  . EP IMPLANTABLE DEVICE N/A 12/07/2015   Procedure: PPM Lead Revision/Repair;  Surgeon: Evans Lance, MD;  Location: [place] CV LAB;  Service: Cardiovascular;  Laterality: N/A;  . KNEE ARTHROSCOPY  2005   left  . LUMBAR SPINE SURGERY     "I've had 6 ORs 1972 thru 2004"  . POLYPECTOMY  04/17/2017   Procedure: POLYPECTOMY;  Surgeon: Rogene Houston, MD;  [facility];  Service: Endoscopy;;  transverse colon x3;   Social History   Socioeconomic History  . Marital status: Married    Spouse name: Not on file  . Number of children: Not on file  . Years of education: Not on file  . Highest education level: Not on file  Occupational History  . Occupation: Immunologist: RETIRED  Social Needs  . Financial resource strain: Not hard at all  . Food insecurity    Worry: Never true    Inability: Never true  . Transportation needs    Medical: No    Non-medical: No  Tobacco Use  . Smoking status: Former Smoker    Packs/day: 1.00     Years: 10.00    Pack years: 10.00    Types: Cigarettes    Quit date: 03/26/1986    Years since quitting: 32.8  . Smokeless tobacco: Never Used  . Tobacco comment: "stopped cigarette  smoking 1988"  Substance and Sexual Activity  . Alcohol use: Never    Alcohol/week: 0.0 standard drinks    Frequency: Never    Comment: "quit alcohol ~ 2007"  . Drug use: No  . Sexual activity: Yes    Partners: Female  Lifestyle  . Physical activity    Days per week: 2 days    Minutes per session: 20 min  . Stress: Not at all  Relationships  . Social connections    Talks on phone: More than three times a week    Gets together: More than three times a week    Attends religious service: More than 4 times per year    Active member of club or organization: Yes    Attends meetings of clubs or organizations: More than 4 times per year    Relationship status: Married  Other Topics Concern  . Not on file  Social History Narrative  . Not on file   Outpatient Encounter Medications as of 01/21/2019  Medication Sig  . acetaminophen (TYLENOL) 500 MG tablet Take 1,000 mg by mouth every 6 (six) hours as needed for moderate pain (thigh pain).  . benzonatate (TESSALON) 100 MG capsule Take 1 capsule (100 mg total) by mouth 3 (three) times daily as needed for cough.  . carvedilol (COREG) 25 MG tablet TAKE 1 TABLET (25 MG TOTAL) BY MOUTH 2 (TWO) TIMES DAILY WITH A MEAL.  Marland Kitchen ELIQUIS 5 MG TABS tablet TAKE 1 TABLET TWICE DAILY (Patient taking differently: Take 5 mg by mouth 2 (two) times daily. )  . HYDROcodone-acetaminophen (NORCO/VICODIN) 5-325 MG tablet Take one tablet po every 4 hrs prn pain (Patient taking differently: Take 1 tablet by mouth every 4 (four) hours as needed for moderate pain. )  . Insulin Pen Needle (BD PEN NEEDLE NANO U/F) 32G X 4 MM MISC 1 each by Does not apply route 4 (four) times daily.  . Magnesium 400 MG CAPS Take 400 mg by mouth daily.   . metFORMIN (GLUCOPHAGE) 500 MG tablet Take 1 tablet  (500 mg total) by mouth  2 (two) times daily with a meal.  . metolazone (ZAROXOLYN) 2.5 MG tablet Take 1 tablet (2.5 mg total) by mouth 2 (two) times a week. Monday and Friday  . Multiple Vitamins-Minerals (MULTIVITAMIN WITH MINERALS) tablet Take 1 tablet by mouth daily.  . potassium chloride SA (KLOR-CON) 20 MEQ tablet Take 60 meq in the am, at lunch, and in the evening  . Resveratrol 100 MG CAPS Take 100 mg by mouth at bedtime.   . rosuvastatin (CRESTOR) 20 MG tablet Take 1 tablet (20 mg total) by mouth daily.  Marland Kitchen torsemide (DEMADEX) 20 MG tablet Take 3 tablets (60 mg total) by mouth 2 (two) times daily.  . TRESIBA FLEXTOUCH 200 UNIT/ML SOPN INJECT 70 UNITS SUBCUTANEOUSLY AT BEDTIME  . TRULICITY 1.5 SV/7.7LT SOPN INJECT 1.5MG UNDER THE SKIN ONCE WEEKLY   No facility-administered encounter medications on file as of 01/21/2019.     ALLERGIES: No Known Allergies  VACCINATION STATUS: Immunization History  Administered Date(s) Administered  . Influenza,inj,Quad PF,6+ Mos 11/24/2014, 11/29/2015, 12/11/2016, 12/25/2017, 12/20/2018  . Influenza-Unspecified 11/24/2013  . Pneumococcal Conjugate-13 11/19/2013  . Pneumococcal Polysaccharide-23 11/29/2015  . Pneumococcal-Unspecified 03/24/2009  . Td 06/23/2010    Diabetes He presents for his follow-up (Telephone visit due to coronavirus pandemic) diabetic visit. He has type 2 diabetes mellitus. Onset time: He was diagnosed at approximate age of 32 years. His disease course has been improving. There are no hypoglycemic associated symptoms. Pertinent negatives for hypoglycemia include no confusion, pallor or seizures. There are no diabetic associated symptoms. Pertinent negatives for diabetes include no fatigue, no polydipsia, no polyphagia, no polyuria and no weakness. There are no hypoglycemic complications. Symptoms are improving. Diabetic complications include heart disease and nephropathy. Risk factors for coronary artery disease include diabetes  mellitus, dyslipidemia, family history, obesity, male sex, hypertension, sedentary lifestyle and tobacco exposure. Current diabetic treatment includes insulin injections. He is following a generally unhealthy diet. When asked about meal planning, he reported none. He has not had a previous visit with a dietitian. He never participates in exercise. His home blood glucose trend is decreasing steadily. His breakfast blood glucose range is generally 130-140 mg/dl. His bedtime blood glucose range is generally 140-180 mg/dl. His overall blood glucose range is 140-180 mg/dl. An ACE inhibitor/angiotensin II receptor blocker is being taken. He does not see a podiatrist.Eye exam is current.  Hyperlipidemia This is a chronic problem. The current episode started more than 1 year ago. The problem is controlled. Exacerbating diseases include diabetes and obesity. Pertinent negatives include no myalgias. Current antihyperlipidemic treatment includes statins. Risk factors for coronary artery disease include diabetes mellitus, dyslipidemia, family history, obesity, male sex, hypertension and a sedentary lifestyle.     Review of systems: Limited as above. Objective:      CMP     Component Value Date/Time   NA 135 01/02/2019 0926   NA 138 09/12/2018 0857   K 3.1 (L) 01/02/2019 0926   CL 93 (L) 01/02/2019 0926   CO2 28 01/02/2019 0926   GLUCOSE 132 (H) 01/02/2019 0926   BUN 28 (H) 01/02/2019 0926   BUN 26 09/12/2018 0857   CREATININE 1.16 01/02/2019 0926   CREATININE 1.32 (H) 03/07/2016 1043   CALCIUM 9.3 01/02/2019 0926   PROT 8.4 (H) 12/15/2018 1014   PROT 8.0 09/12/2018 0857   ALBUMIN 3.4 (L) 12/15/2018 1014   ALBUMIN 4.1 09/12/2018 0857   AST 57 (H) 12/15/2018 1014   ALT 36 12/15/2018 1014   ALKPHOS 207 (H) 12/15/2018 1014  BILITOT 2.9 (H) 12/15/2018 1014   BILITOT 1.6 (H) 09/12/2018 0857   GFRNONAA >60 01/02/2019 0926   GFRAA >60 01/02/2019 0926     Diabetic Labs (most recent): Lab Results   Component Value Date   HGBA1C 8.3 (H) 12/15/2018   HGBA1C 7.8 (H) 09/12/2018   HGBA1C 6.8 (H) 08/19/2018     Lipid Panel     Component Value Date/Time   CHOL 142 01/17/2018 0835   TRIG 90 01/17/2018 0835   HDL 53 01/17/2018 0835   CHOLHDL 2.7 01/17/2018 0835   CHOLHDL 3.2 11/16/2013 0702   VLDL 45 (H) 11/16/2013 0702   LDLCALC 71 01/17/2018 0835      Lab Results  Component Value Date   TSH 1.940 07/10/2017   TSH 1.563 08/25/2015   TSH 1.206 01/05/2011   FREET4 1.12 07/10/2017        Assessment & Plan:   1. DM type 2 causing vascular disease ([place])  - John [name] has currently uncontrolled symptomatic type 2 DM since 73 years of age. -He reports glycemic profile controlled to target both fasting and postprandial.  Prebreakfast readings range from 79-115, bedtime readings range from 107-222. -His previsit labs show A1c of 8.3%, slowly increasing from 7.5%.   -He was recently hospitalized for CHF exacerbation, was discharged on diuretics and potassium supplement.     -He does not report any major hypoglycemia. Recent labs reviewed.  -his diabetes is complicated by obesity/sedentary life , CKD, CHF , congestive heart failure/bradyarrhythmia and YADRIEL KERRIGAN remains at a high risk for more acute and chronic complications which include CAD, CVA, CKD, retinopathy, and neuropathy. These are all discussed in detail with the patient.  - I encouraged him to switch to  unprocessed or minimally processed complex starch and increased protein intake (animal or plant source), fruits, and vegetables.  - he  admits there is a room for improvement in his diet and drink choices. -  Suggestion is made for him to avoid simple carbohydrates  from his diet including Cakes, Sweet Desserts / Pastries, Ice Cream, Soda (diet and regular), Sweet Tea, Candies, Chips, Cookies, Sweet Pastries,  Store Bought Juices, Alcohol in Excess of  1-2 drinks a day, Artificial Sweeteners, Coffee  Creamer, and "Sugar-free" Products. This will help patient to have stable blood glucose profile and potentially avoid unintended weight gain.  - he is advised to stick to a routine mealtimes to eat 3 meals  a day and avoid unnecessary snacks ( to snack only to correct hypoglycemia).    - I have approached him with the following individualized plan to manage diabetes and patient agrees:   -Based on his current glycemic response, he will not require prandial insulin for now.   -He is reporting near target glycemic profile both fasting and postprandial, advised to continue Tresiba  70 units nightly, continue metformin 500 mg p.o. twice daily, continue Trulicity 1.5 mg subcutaneously weekly.    - he is advised to continue  strict monitoring of blood glucose 2 times a day-daily before breakfast and at bedtime. - Patient is warned not to take insulin without proper monitoring per orders.  -Patient is encouraged to call clinic for blood glucose levels less than 70 or above 200 mg /dl.  He will not tolerate higher dose of metformin due to CHF and recent acute on chronic renal insufficiency.   2) Lipids/HPL:    His recent lipid panel shows controlled LDL at 53.  He will continue to benefit  from statin treatment, advised to continue Crestor 20 mg p.o. nightly.     3) hypertension-he is advised to home monitor blood pressure and report if > 140/90 on 2 separate readings.  -He is advised to continue on his torsemide as needed, carvedilol 25 mg p.o. twice daily.  He is advised to continue follow-up with his cardiologist.  - I advised patient to maintain close follow up with Mikey Kirschner, MD for primary care needs. - Patient Care Time Today:  25 min, of which >50% was spent in  counseling and the rest reviewing his  current and  previous labs/studies, previous treatments, his blood glucose readings, and medications' doses and developing a plan for long-term care based on the latest recommendations  for standards of care.   John [name] participated in the discussions, expressed understanding, and voiced agreement with the above plans.  All questions were answered to his satisfaction. he is encouraged to contact clinic should he have any questions or concerns prior to his return visit.   Follow up plan: - Return in about 3 months (around 04/23/2019) for Bring Meter and Logs- A1c in Office, Include 8 log sheets.  Glade Lloyd, MD [facility] [address] [place], [place] [address] Phone: [phone]  Fax: [phone]    01/21/2019, 2:52 PM  This note was partially dictated with voice recognition software. Similar sounding words can be transcribed inadequately or may not  be corrected upon review.

## 2019-01-22 ENCOUNTER — Other Ambulatory Visit: Payer: Self-pay

## 2019-01-23 ENCOUNTER — Other Ambulatory Visit: Payer: Self-pay

## 2019-01-23 NOTE — Patient Outreach (Signed)
This encounter was created in error - please disregard.

## 2019-01-23 NOTE — Patient Outreach (Signed)
[facility]) Care Management  01/23/2019  John [name] 09-Mar-1946 [id number]   Successful outreach with John [name]. He reports feeling very well today. Denies complaints of shortness of breath, pain or chest discomfort. Denies increased abdominal or lower extremity edema. Reports ambulating without difficulty.  He reports compliance with medications. On 01/02/19, his potassium dose was increased to 60 meq three times a day. He will complete a repeat BMET within the next week.  He continues to weigh and monitor his blood glucose daily. Morning weight -225lbs.  Fasting blood glucose -176m/dl. He verbalized awareness of parameters and indications for notifying his physicians.  He reports improvement with diet and activity recommendations. Reports taking several walks in his driveway when weather permits. Reports tolerating well and states that his activity tolerance has improved. Reports following recommended safety precautions to avoid falls.   He continues to perform all ADLs and tasks independently. Denies urgent concerns or changes in care management needs.  Current Outpatient Medications on File Prior to Visit  Medication Sig Dispense Refill  . acetaminophen (TYLENOL) 500 MG tablet Take 1,000 mg by mouth every 6 (six) hours as needed for moderate pain (thigh pain).    . benzonatate (TESSALON) 100 MG capsule Take 1 capsule (100 mg total) by mouth 3 (three) times daily as needed for cough. 90 capsule 1  . carvedilol (COREG) 25 MG tablet TAKE 1 TABLET (25 MG TOTAL) BY MOUTH 2 (TWO) TIMES DAILY WITH A MEAL. 180 tablet 3  . ELIQUIS 5 MG TABS tablet TAKE 1 TABLET TWICE DAILY (Patient taking differently: Take 5 mg by mouth 2 (two) times daily. ) 180 tablet 3  . HYDROcodone-acetaminophen (NORCO/VICODIN) 5-325 MG tablet Take one tablet po every 4 hrs prn pain (Patient taking differently: Take 1 tablet by mouth every 4 (four) hours as needed for moderate pain. ) 28 tablet 0  .  Insulin Pen Needle (BD PEN NEEDLE NANO U/F) 32G X 4 MM MISC 1 each by Does not apply route 4 (four) times daily. 150 each 5  . Magnesium 400 MG CAPS Take 400 mg by mouth daily.     . metFORMIN (GLUCOPHAGE) 500 MG tablet Take 1 tablet (500 mg total) by mouth 2 (two) times daily with a meal. 180 tablet 1  . metolazone (ZAROXOLYN) 2.5 MG tablet Take 1 tablet (2.5 mg total) by mouth 2 (two) times a week. Monday and Friday 15 tablet 1  . Multiple Vitamins-Minerals (MULTIVITAMIN WITH MINERALS) tablet Take 1 tablet by mouth daily.    . potassium chloride SA (KLOR-CON) 20 MEQ tablet Take 60 meq in the am, at lunch, and in the evening 270 tablet 6  . Resveratrol 100 MG CAPS Take 100 mg by mouth at bedtime.     . rosuvastatin (CRESTOR) 20 MG tablet Take 1 tablet (20 mg total) by mouth daily. 90 tablet 1  . torsemide (DEMADEX) 20 MG tablet Take 3 tablets (60 mg total) by mouth 2 (two) times daily. 540 tablet 0  . TRESIBA FLEXTOUCH 200 UNIT/ML SOPN INJECT 70 UNITS SUBCUTANEOUSLY AT BEDTIME 75 mL 0  . TRULICITY 1.5 MVO/3.5KKSOPN INJECT 1.5MG UNDER THE SKIN ONCE WEEKLY 6 mL 0   No current facility-administered medications on file prior to visit.       THN CM Care Plan Problem One     Most Recent Value  Care Plan Problem One  Risk for Readmission  Role Documenting the Problem One  Care Management CManelefor Problem  One  Active  THN Long Term Goal   Over the next 90 days, patient will not be hospitalized.  THN Long Term Goal Start Date  11/24/18  Interventions for Problem One Long Term Goal  Reviewed daily weight ranges, blood sugar readings and compliance with diabetic diet recommendations. Patient remains compliant with medications. DIscussed parameter ranges and indications for notiying MD. [Pending follow-up labs within a week.]  THN CM Short Term Goal #1   Over the next 30 days, patient will attend all scheduled provider appointments.  THN CM Short Term Goal #1 Start Date  11/24/18   THN CM Short Term Goal #1 Met Date  12/24/18  THN CM Short Term Goal #2   Over the next 30 days, patient will take all medications as prescribed.  THN CM Short Term Goal #2 Start Date  11/24/18  THN CM Short Term Goal #2 Met Date  12/24/18  THN CM Short Term Goal #3  Over the next 30 days, patient will continue to weigh daily and log daily readings.  THN CM Short Term Goal #3 Start Date  11/24/18  THN CM Short Term Goal #3 Met Date  12/24/18  THN CM Short Term Goal #4  Over the next 30 days, patient will monitor FBS daily and record readings.   THN CM Short Term Goal #4 Start Date  11/24/18  [facility] CM Short Term Goal #4 Met Date  12/24/18      PLAN -Will follow-up after labs are complete.   [facility] Care Management [phone]

## 2019-01-27 ENCOUNTER — Other Ambulatory Visit (HOSPITAL_COMMUNITY)
Admission: RE | Admit: 2019-01-27 | Discharge: 2019-01-27 | Disposition: A | Discharge status: Home / Self Care | Payer: Medicare HMO | Source: Ambulatory Visit | Attending: Student | Admitting: Student

## 2019-01-27 ENCOUNTER — Other Ambulatory Visit: Payer: Self-pay

## 2019-01-27 DIAGNOSIS — E876 Hypokalemia: Secondary | ICD-10-CM | POA: Insufficient documentation

## 2019-01-27 LAB — BASIC METABOLIC PANEL
Anion gap: 13 (ref 5–15)
BUN: 32 mg/dL — ABNORMAL HIGH (ref 8–23)
CO2: 31 mmol/L (ref 22–32)
Calcium: 9.2 mg/dL (ref 8.9–10.3)
Chloride: 92 mmol/L — ABNORMAL LOW (ref 98–111)
Creatinine, Ser: 1.29 mg/dL — ABNORMAL HIGH (ref 0.61–1.24)
GFR calc Af Amer: >60 mL/min (ref >60)
GFR calc non Af Amer: 55 mL/min — ABNORMAL LOW (ref >60)
Glucose, Bld: 118 mg/dL — ABNORMAL HIGH (ref 70–99)
Potassium: 3 mmol/L — ABNORMAL LOW (ref 3.5–5.1)
Sodium: 136 mmol/L (ref 135–145)

## 2019-01-27 NOTE — Progress Notes (Signed)
Remote pacemaker transmission.   

## 2019-01-29 ENCOUNTER — Telehealth: Payer: Self-pay | Admitting: *Deleted

## 2019-01-29 DIAGNOSIS — Z79899 Other long term (current) drug therapy: Secondary | ICD-10-CM

## 2019-01-29 NOTE — Telephone Encounter (Signed)
For [place], would do K-dur 60 mEq TID and encourage compliance with TID dosing so his body has time to absorb this. Repeat BMET in 3-4 weeks.

## 2019-02-05 ENCOUNTER — Other Ambulatory Visit: Payer: Self-pay | Admitting: Cardiovascular Disease

## 2019-02-06 ENCOUNTER — Other Ambulatory Visit: Payer: Self-pay | Admitting: Family Medicine

## 2019-02-10 ENCOUNTER — Other Ambulatory Visit: Payer: Self-pay

## 2019-02-10 ENCOUNTER — Encounter: Payer: Self-pay | Admitting: Internal Medicine

## 2019-02-10 ENCOUNTER — Ambulatory Visit (INDEPENDENT_AMBULATORY_CARE_PROVIDER_SITE_OTHER): Payer: Medicare HMO | Admitting: Internal Medicine

## 2019-02-10 VITALS — BP 125/80 | HR 82 | Temp 97.3°F | Ht 72.0 in | Wt 232.0 lb

## 2019-02-10 DIAGNOSIS — I10 Essential (primary) hypertension: Secondary | ICD-10-CM | POA: Diagnosis not present

## 2019-02-10 DIAGNOSIS — I5042 Chronic combined systolic (congestive) and diastolic (congestive) heart failure: Secondary | ICD-10-CM | POA: Diagnosis not present

## 2019-02-10 MED ORDER — SILDENAFIL CITRATE 50 MG PO TABS: 50.0000 mg | ORAL_TABLET | Freq: Every day | ORAL | 3 refills | Status: DC | PRN

## 2019-02-10 NOTE — Patient Instructions (Signed)
Medication Instructions:  Your physician recommends that you continue on your current medications as directed. Please refer to the Current Medication list given to you today.  Take Viagra 50 mg as needed   *If you need a refill on your cardiac medications before your next appointment, please call your pharmacy*  Lab Work: NONE   If you have labs (blood work) drawn today and your tests are completely normal, you will receive your results only by: Marland Kitchen MyChart Message (if you have MyChart) OR . A paper copy in the mail If you have any lab test that is abnormal or we need to change your treatment, we will call you to review the results.  Testing/Procedures: NONE   Follow-Up: At [facility], you and your health needs are our priority.  As part of our continuing mission to provide you with exceptional heart care, we have created designated Provider Care Teams.  These Care Teams include your primary Cardiologist (physician) and Advanced Practice Providers (APPs -  Physician Assistants and Nurse Practitioners) who all work together to provide you with the care you need, when you need it.  Your next appointment:   6 months  The format for your next appointment:   In Person  Provider:   [name], MD  Other Instructions Thank you for choosing [facility]!

## 2019-02-10 NOTE — Progress Notes (Signed)
HPI [name] returns today for followup of CHB, s/p PPM, persistent atrial fib, and mild AS. The patient has mild LV dysfunction and is now PM dependent. He does not have syncope. He denies chest pain. He has class 2 dyspnea. No Known Allergies   Current Outpatient Medications  Medication Sig Dispense Refill  . acetaminophen (TYLENOL) 500 MG tablet Take 1,000 mg by mouth every 6 (six) hours as needed for moderate pain (thigh pain).    . benazepril (LOTENSIN) 40 MG tablet TAKE 1 TABLET EVERY DAY 90 tablet 1  . benzonatate (TESSALON) 100 MG capsule Take 1 capsule (100 mg total) by mouth 3 (three) times daily as needed for cough. 90 capsule 1  . carvedilol (COREG) 25 MG tablet TAKE 1 TABLET (25 MG TOTAL) BY MOUTH 2 (TWO) TIMES DAILY WITH A MEAL. 180 tablet 3  . ELIQUIS 5 MG TABS tablet TAKE 1 TABLET TWICE DAILY (Patient taking differently: Take 5 mg by mouth 2 (two) times daily. ) 180 tablet 3  . HYDROcodone-acetaminophen (NORCO/VICODIN) 5-325 MG tablet Take one tablet po every 4 hrs prn pain (Patient taking differently: Take 1 tablet by mouth every 4 (four) hours as needed for moderate pain. ) 28 tablet 0  . Insulin Pen Needle (BD PEN NEEDLE NANO U/F) 32G X 4 MM MISC 1 each by Does not apply route 4 (four) times daily. 150 each 5  . Magnesium 400 MG CAPS Take 400 mg by mouth daily.     . metFORMIN (GLUCOPHAGE) 500 MG tablet Take 1 tablet (500 mg total) by mouth 2 (two) times daily with a meal. 180 tablet 1  . metolazone (ZAROXOLYN) 2.5 MG tablet Take 1 tablet (2.5 mg total) by mouth 2 (two) times a week. Monday and Friday 15 tablet 1  . Multiple Vitamins-Minerals (MULTIVITAMIN WITH MINERALS) tablet Take 1 tablet by mouth daily.    . potassium chloride SA (KLOR-CON) 20 MEQ tablet Take 60 meq in the am, at lunch, and in the evening 270 tablet 6  . Resveratrol 100 MG CAPS Take 100 mg by mouth at bedtime.     . rosuvastatin (CRESTOR) 20 MG tablet TAKE 1 TABLET EVERY DAY 90 tablet 1  .  torsemide (DEMADEX) 20 MG tablet TAKE 3 TABLETS (60 MG TOTAL) BY MOUTH 2 (TWO) TIMES DAILY. DOSE CHANGE.  540 tablet 1  . TRESIBA FLEXTOUCH 200 UNIT/ML SOPN INJECT 70 UNITS SUBCUTANEOUSLY AT BEDTIME 75 mL 0  . TRULICITY 1.5 UD/1.4HF SOPN INJECT 1.5MG UNDER THE SKIN ONCE WEEKLY 6 mL 0   No current facility-administered medications for this visit.      Past Medical History:  Diagnosis Date  . Atrial flutter ([place]) 12/2010   Admitted with symptomatic bradycardia (HR 40s), atrial flutter with slow ventricular response 12/2010 + volume overload; AV nodal agents d/c'd and Pradaxa started; RFA in 01/2011  . CHF (congestive heart failure) ([place])   . Chronic combined systolic and diastolic heart failure ([place])    a. echo 01/06/11: mild LVH, EF 65-70%, mod to severe LAE, mild RVE, mild RAE, PASP 32;   TEE 10/12: EF 45-50% b. EF 45 to 50% by echo in 07/2018  . Class 2 severe obesity due to excess calories with serious comorbidity and body mass index (BMI) of 37.0 to 37.9 in adult ([place]) 07/17/2017  . Diabetes mellitus    non insulin dependant  . Hyperlipidemia   . Hypertension   . Osteoarthritis   . Presence of permanent cardiac  pacemaker   . PSVT (paroxysmal supraventricular tachycardia) ([facility])    Possibly atrial flutter    ROS:   All systems reviewed and negative except as noted in the HPI.   Past Surgical History:  Procedure Laterality Date  . ATRIAL FLUTTER ABLATION N/A 02/07/2011   Procedure: ATRIAL FLUTTER ABLATION;  Surgeon: Evans Lance, MD;  Location: [facility] CATH LAB;  Service: Cardiovascular;  Laterality: N/A;  . CARDIAC ELECTROPHYSIOLOGY STUDY AND ABLATION  02/07/11  . CARDIOVERSION N/A 03/23/2016   Procedure: CARDIOVERSION;  Surgeon: Evans Lance, MD;  Location: [place];  Service: Cardiovascular;  Laterality: N/A;  . COLONOSCOPY N/A 04/17/2017   Procedure: COLONOSCOPY;  Surgeon: Rogene Houston, MD;  [facility];  Service: Endoscopy;  Laterality: N/A;  830  . EP  IMPLANTABLE DEVICE N/A 08/29/2015   Procedure: Pacemaker Implant;  Surgeon: Evans Lance, MD;  Location: [place] CV LAB;  Service: Cardiovascular;  Laterality: N/A;  . EP IMPLANTABLE DEVICE N/A 12/07/2015   Procedure: PPM Lead Revision/Repair;  Surgeon: Evans Lance, MD;  Location: [place] CV LAB;  Service: Cardiovascular;  Laterality: N/A;  . KNEE ARTHROSCOPY  2005   left  . LUMBAR SPINE SURGERY     "I've had 6 ORs 1972 thru 2004"  . POLYPECTOMY  04/17/2017   Procedure: POLYPECTOMY;  Surgeon: Rogene Houston, MD;  [facility];  Service: Endoscopy;;  transverse colon x3;     Family History  Problem Relation Age of Onset  . Heart attack Mother   . Hypertension Mother   . Heart attack Father   . Hypertension Father   . Heart attack Brother   . Colon cancer Neg Hx      Social History   Socioeconomic History  . Marital status: Married    Spouse name: Not on file  . Number of children: Not on file  . Years of education: Not on file  . Highest education level: Not on file  Occupational History  . Occupation: Immunologist: RETIRED  Social Needs  . Financial resource strain: Not hard at all  . Food insecurity    Worry: Never true    Inability: Never true  . Transportation needs    Medical: No    Non-medical: No  Tobacco Use  . Smoking status: Former Smoker    Packs/day: 1.00    Years: 10.00    Pack years: 10.00    Types: Cigarettes    Quit date: 03/26/1986    Years since quitting: 32.9  . Smokeless tobacco: Never Used  . Tobacco comment: "stopped cigarette  smoking 1988"  Substance and Sexual Activity  . Alcohol use: Never    Alcohol/week: 0.0 standard drinks    Frequency: Never    Comment: "quit alcohol ~ 2007"  . Drug use: No  . Sexual activity: Yes    Partners: Female  Lifestyle  . Physical activity    Days per week: 3 days    Minutes per session: 20 min  . Stress: Not at all  Relationships  . Social connections     Talks on phone: More than three times a week    Gets together: More than three times a week    Attends religious service: More than 4 times per year    Active member of club or organization: Yes    Attends meetings of clubs or organizations: More than 4 times per year    Relationship status: Married  .  Intimate partner violence    Fear of current or ex partner: No    Emotionally abused: No    Physically abused: No    Forced sexual activity: No  Other Topics Concern  . Not on file  Social History Narrative  . Not on file     BP 125/80   Pulse 82   Temp (!) 97.3 F (36.3 C) (Temporal)   Ht 6' (1.829 m)   Wt 232 lb (105.2 kg)   SpO2 99%   BMI 31.46 kg/m   Physical Exam:  Well appearing NAD HEENT: Unremarkable Neck:  No JVD, no thyromegally Lymphatics:  No adenopathy Back:  No CVA tenderness Lungs:  Clear with no wheezes HEART:  Regular rate rhythm, no murmurs, no rubs, no clicks Abd:  soft, positive bowel sounds, no organomegally, no rebound, no guarding Ext:  2 plus pulses, no edema, no cyanosis, no clubbing Skin:  No rashes no nodules Neuro:  CN II through XII intact, motor grossly intact  DEVICE  Normal device function.  See PaceArt for details.   Assess/Plan: 1. Chronic systolic/diastolic heart failure - his symptoms are class 2. He will continue his current meds. I encouraged him to avoid salty food. 2. Hypokalemia - he will continue with fairly high dose potassium supplementation. His levels are running low due to metolazone and torsemide. 3. Atrial fib - his VR is well controlled. No change. He will continue his eliquis 4. PPM - his medtronic DDD PM is working normally. We will recheck in several months.  John [name].D.

## 2019-02-20 ENCOUNTER — Other Ambulatory Visit: Payer: Self-pay | Admitting: Adult Health

## 2019-02-26 ENCOUNTER — Other Ambulatory Visit: Payer: Self-pay | Admitting: Student

## 2019-02-26 DIAGNOSIS — Z79899 Other long term (current) drug therapy: Secondary | ICD-10-CM | POA: Diagnosis not present

## 2019-02-27 ENCOUNTER — Other Ambulatory Visit: Payer: Self-pay | Admitting: "Endocrinology

## 2019-02-27 LAB — BASIC METABOLIC PANEL
BUN/Creatinine Ratio: 13 (ref 10–24)
BUN: 17 mg/dL (ref 8–27)
CO2: 23 mmol/L (ref 20–29)
Calcium: 9.6 mg/dL (ref 8.6–10.2)
Chloride: 99 mmol/L (ref 96–106)
Creatinine, Ser: 1.34 mg/dL — ABNORMAL HIGH (ref 0.76–1.27)
GFR calc Af Amer: 60 mL/min/{1.73_m2} (ref >59)
GFR calc non Af Amer: 52 mL/min/{1.73_m2} — ABNORMAL LOW (ref >59)
Glucose: 158 mg/dL — ABNORMAL HIGH (ref 65–99)
Potassium: 4.7 mmol/L (ref 3.5–5.2)
Sodium: 137 mmol/L (ref 134–144)

## 2019-03-06 ENCOUNTER — Telehealth: Payer: Self-pay | Admitting: Cardiovascular Disease

## 2019-03-06 DIAGNOSIS — H524 Presbyopia: Secondary | ICD-10-CM | POA: Diagnosis not present

## 2019-03-06 NOTE — Telephone Encounter (Signed)

## 2019-03-09 DIAGNOSIS — H524 Presbyopia: Secondary | ICD-10-CM | POA: Diagnosis not present

## 2019-03-09 DIAGNOSIS — H52223 Regular astigmatism, bilateral: Secondary | ICD-10-CM | POA: Diagnosis not present

## 2019-03-10 ENCOUNTER — Encounter: Payer: Self-pay | Admitting: Cardiovascular Disease

## 2019-03-10 ENCOUNTER — Telehealth (INDEPENDENT_AMBULATORY_CARE_PROVIDER_SITE_OTHER): Payer: Medicare HMO | Admitting: Cardiovascular Disease

## 2019-03-10 ENCOUNTER — Other Ambulatory Visit: Payer: Self-pay

## 2019-03-10 VITALS — BP 118/75 | HR 79 | Ht 71.0 in | Wt 229.0 lb

## 2019-03-10 DIAGNOSIS — I110 Hypertensive heart disease with heart failure: Secondary | ICD-10-CM | POA: Diagnosis not present

## 2019-03-10 DIAGNOSIS — I4819 Other persistent atrial fibrillation: Secondary | ICD-10-CM

## 2019-03-10 DIAGNOSIS — I442 Atrioventricular block, complete: Secondary | ICD-10-CM

## 2019-03-10 DIAGNOSIS — Z950 Presence of cardiac pacemaker: Secondary | ICD-10-CM

## 2019-03-10 DIAGNOSIS — I5042 Chronic combined systolic (congestive) and diastolic (congestive) heart failure: Secondary | ICD-10-CM | POA: Diagnosis not present

## 2019-03-10 DIAGNOSIS — E785 Hyperlipidemia, unspecified: Secondary | ICD-10-CM

## 2019-03-10 DIAGNOSIS — I10 Essential (primary) hypertension: Secondary | ICD-10-CM

## 2019-03-10 DIAGNOSIS — N183 Chronic kidney disease, stage 3 unspecified: Secondary | ICD-10-CM

## 2019-03-10 NOTE — Progress Notes (Signed)
Virtual Visit via Telephone Note   This visit type was conducted due to national recommendations for restrictions regarding the COVID-19 Pandemic (e.g. social distancing) in an effort to limit this patient's exposure and mitigate transmission in our community.  Due to his co-morbid illnesses, this patient is at least at moderate risk for complications without adequate follow up.  This format is felt to be most appropriate for this patient at this time.  The patient did not have access to video technology/had technical difficulties with video requiring transitioning to audio format only (telephone).  All issues noted in this document were discussed and addressed.  No physical exam could be performed with this format.  Please refer to the patient's chart for his  consent to telehealth for [facility].   Date:  03/10/2019   ID:  John [name], DOB [id number], MRN [id number]  Patient Location: Home Provider Location: Office  PCP:  Mikey Kirschner, MD  Cardiologist:  [name], MD  Electrophysiologist:  None   Evaluation Performed:  Follow-Up Visit  Chief Complaint:  CHF  History of Present Illness:    John [name] is a 73 y.o. male with past medical history of chronic combined systolic and diastolic CHF (EF 45 to 00% by echo in 07/2018), atrial flutter (s/p ablation in 2012), paroxysmal atrial fibrillation, complete heart block (s/p PPM placement in 2017), hypertension, hyperlipidemia, and chronic kidney disease stage III.  He is doing well overall.  His weight has come down with diuretics.  He denies chest pain, palpitations, shortness of breath, leg swelling, orthopnea and paroxysmal nocturnal dyspnea.     Past Medical History:  Diagnosis Date  . Atrial flutter ([place]) 12/2010   Admitted with symptomatic bradycardia (HR 40s), atrial flutter with slow ventricular response 12/2010 + volume overload; AV nodal agents d/c'd and Pradaxa started; RFA in 01/2011  . CHF  (congestive heart failure) ([place])   . Chronic combined systolic and diastolic heart failure ([place])    a. echo 01/06/11: mild LVH, EF 65-70%, mod to severe LAE, mild RVE, mild RAE, PASP 32;   TEE 10/12: EF 45-50% b. EF 45 to 50% by echo in 07/2018  . Class 2 severe obesity due to excess calories with serious comorbidity and body mass index (BMI) of 37.0 to 37.9 in adult ([place]) 07/17/2017  . Diabetes mellitus    non insulin dependant  . Hyperlipidemia   . Hypertension   . Osteoarthritis   . Presence of permanent cardiac pacemaker   . PSVT (paroxysmal supraventricular tachycardia) ([facility])    Possibly atrial flutter   Past Surgical History:  Procedure Laterality Date  . ATRIAL FLUTTER ABLATION N/A 02/07/2011   Procedure: ATRIAL FLUTTER ABLATION;  Surgeon: Evans Lance, MD;  Location: [facility] CATH LAB;  Service: Cardiovascular;  Laterality: N/A;  . CARDIAC ELECTROPHYSIOLOGY STUDY AND ABLATION  02/07/11  . CARDIOVERSION N/A 03/23/2016   Procedure: CARDIOVERSION;  Surgeon: Evans Lance, MD;  Location: [place];  Service: Cardiovascular;  Laterality: N/A;  . COLONOSCOPY N/A 04/17/2017   Procedure: COLONOSCOPY;  Surgeon: Rogene Houston, MD;  [facility];  Service: Endoscopy;  Laterality: N/A;  830  . EP IMPLANTABLE DEVICE N/A 08/29/2015   Procedure: Pacemaker Implant;  Surgeon: Evans Lance, MD;  Location: [place] CV LAB;  Service: Cardiovascular;  Laterality: N/A;  . EP IMPLANTABLE DEVICE N/A 12/07/2015   Procedure: PPM Lead Revision/Repair;  Surgeon: Evans Lance, MD;  Location: [place] CV LAB;  Service:  Cardiovascular;  Laterality: N/A;  . KNEE ARTHROSCOPY  2005   left  . LUMBAR SPINE SURGERY     "I've had 6 ORs 1972 thru 2004"  . POLYPECTOMY  04/17/2017   Procedure: POLYPECTOMY;  Surgeon: Rogene Houston, MD;  [facility];  Service: Endoscopy;;  transverse colon x3;     Current Meds  Medication Sig  . acetaminophen (TYLENOL) 500 MG tablet Take 1,000 mg by  mouth every 6 (six) hours as needed for moderate pain (thigh pain).  . benazepril (LOTENSIN) 40 MG tablet TAKE 1 TABLET EVERY DAY  . benzonatate (TESSALON) 100 MG capsule TAKE 1 CAPSULE THREE TIMES DAILY AS NEEDED FOR COUGH  . carvedilol (COREG) 25 MG tablet TAKE 1 TABLET (25 MG TOTAL) BY MOUTH 2 (TWO) TIMES DAILY WITH A MEAL.  Marland Kitchen ELIQUIS 5 MG TABS tablet TAKE 1 TABLET TWICE DAILY (Patient taking differently: Take 5 mg by mouth 2 (two) times daily. )  . HYDROcodone-acetaminophen (NORCO/VICODIN) 5-325 MG tablet Take one tablet po every 4 hrs prn pain (Patient taking differently: Take 1 tablet by mouth every 4 (four) hours as needed for moderate pain. )  . Insulin Pen Needle (BD PEN NEEDLE NANO U/F) 32G X 4 MM MISC 1 each by Does not apply route 4 (four) times daily.  . Magnesium 400 MG CAPS Take 400 mg by mouth daily.   . metFORMIN (GLUCOPHAGE) 500 MG tablet Take 1 tablet (500 mg total) by mouth 2 (two) times daily with a meal.  . metolazone (ZAROXOLYN) 2.5 MG tablet Take 1 tablet (2.5 mg total) by mouth 2 (two) times a week. Monday and Friday  . Multiple Vitamins-Minerals (MULTIVITAMIN WITH MINERALS) tablet Take 1 tablet by mouth daily.  . potassium chloride SA (KLOR-CON) 20 MEQ tablet Take 60 meq in the am, at lunch, and in the evening  . Resveratrol 100 MG CAPS Take 100 mg by mouth at bedtime.   . rosuvastatin (CRESTOR) 20 MG tablet TAKE 1 TABLET EVERY DAY  . sildenafil (VIAGRA) 50 MG tablet Take 1 tablet (50 mg total) by mouth daily as needed for erectile dysfunction.  . torsemide (DEMADEX) 20 MG tablet TAKE 3 TABLETS (60 MG TOTAL) BY MOUTH 2 (TWO) TIMES DAILY. DOSE CHANGE.   Marland Kitchen TRESIBA FLEXTOUCH 200 UNIT/ML SOPN INJECT 70 UNITS SUBCUTANEOUSLY AT BEDTIME  . TRULICITY 1.5 NL/8.9QJ SOPN INJECT 1.5MG (1 PEN) SUBCUTANEOUSLY EVERY WEEK     Allergies:   Patient has no known allergies.   Social History   Tobacco Use  . Smoking status: Former Smoker    Packs/day: 1.00    Years: 10.00    Pack  years: 10.00    Types: Cigarettes    Quit date: 03/26/1986    Years since quitting: 32.9  . Smokeless tobacco: Never Used  . Tobacco comment: "stopped cigarette  smoking 1988"  Substance Use Topics  . Alcohol use: Never    Alcohol/week: 0.0 standard drinks    Comment: "quit alcohol ~ 2007"  . Drug use: No     Family Hx: The patient's family history includes Heart attack in his brother, father, and mother; Hypertension in his father and mother. There is no history of Colon cancer.  ROS:   Please see the history of present illness.     All other systems reviewed and are negative.   Prior CV studies:   The following studies were reviewed today:   Echocardiogram: 07/2018 IMPRESSIONS   1. The left ventricle has mildly reduced systolic  function, with an ejection fraction of 45-50%. The cavity size was mild to moderately dilated. There is mildly increased left ventricular wall thickness. Left ventricular diastolic Doppler parameters are consistent with pseudonormalization. Left ventrical global hypokinesis without regional wall motion abnormalities. 2. The right ventricle has normal systolic function. The cavity was normal. There is no increase in right ventricular wall thickness. 3. Left atrial size was severely dilated. 4. The aortic valve is tricuspid. Mild thickening of the aortic valve. Moderate calcification of the aortic valve. Mild stenosis of the aortic valve. 5. The ascending aorta is normal in size and structure. 6. The inferior vena cava was dilated in size with <50% respiratory variability.  Labs/Other Tests and Data Reviewed:    EKG:  No ECG reviewed.  Recent Labs: 11/11/2018: B Natriuretic Peptide 1,718.0; Hemoglobin 13.0; Platelets 150 11/17/2018: Magnesium 2.6 12/15/2018: ALT 36 02/26/2019: BUN 17; Creatinine, Ser 1.34; Potassium 4.7; Sodium 137   Recent Lipid Panel Lab Results  Component Value Date/Time   CHOL 142 01/17/2018 08:35 AM   TRIG 90  01/17/2018 08:35 AM   HDL 53 01/17/2018 08:35 AM   CHOLHDL 2.7 01/17/2018 08:35 AM   CHOLHDL 3.2 11/16/2013 07:02 AM   LDLCALC 71 01/17/2018 08:35 AM    Wt Readings from Last 3 Encounters:  03/10/19 229 lb (103.9 kg)  02/10/19 232 lb (105.2 kg)  11/21/18 245 lb 12.8 oz (111.5 kg)     Objective:    Vital Signs:  BP 118/75   Pulse 79   Ht 5' 11"  (1.803 m)   Wt 229 lb (103.9 kg)   BMI 31.94 kg/m    VITAL SIGNS:  reviewed  ASSESSMENT & PLAN:    1.  Chronic combined heart failure: LVEF 45 to 50% by echocardiogram May 2020.  Continue current dosing of torsemide and metolazone with supplemental potassium.  Continue carvedilol and benazepril.  2.  Persistent atrial fibrillation: Status post flutter ablation in 2012.  Continue carvedilol for rate control.  Continue Eliquis for systemic anticoagulation.  3.  Complete heart block: Status post pacemaker placement in 2017.  He is pacemaker dependent.  Followed by Dr. Lovena Le.  4.  Hypertension: Blood pressure is normal.  No changes to therapy.  5.  Hyperlipidemia: Continue rosuvastatin.  6.  Chronic kidney disease stage III: Creatinine 1.34 on 02/26/2019.    COVID-19 Education: The signs and symptoms of COVID-19 were discussed with the patient and how to seek care for testing (follow up with PCP or arrange E-visit).  The importance of social distancing was discussed today.  Time:   Today, I have spent 10 minutes with the patient with telehealth technology discussing the above problems.     Medication Adjustments/Labs and Tests Ordered: Current medicines are reviewed at length with the patient today.  Concerns regarding medicines are outlined above.   Tests Ordered: No orders of the defined types were placed in this encounter.   Medication Changes: No orders of the defined types were placed in this encounter.   Follow Up:  Virtual Visit  in 6 month(s)  Signed, [name], MD  03/10/2019 11:00 AM    [place]

## 2019-03-10 NOTE — Patient Instructions (Addendum)
Medication Instructions:  Your physician recommends that you continue on your current medications as directed. Please refer to the Current Medication list given to you today.  *If you need a refill on your cardiac medications before your next appointment, please call your pharmacy*  Lab Work: None today  If you have labs (blood work) drawn today and your tests are completely normal, you will receive your results only by: Marland Kitchen MyChart Message (if you have MyChart) OR . A paper copy in the mail If you have any lab test that is abnormal or we need to change your treatment, we will call you to review the results.  Testing/Procedures: None today  Follow-Up: At [facility], you and your health needs are our priority.  As part of our continuing mission to provide you with exceptional heart care, we have created designated Provider Care Teams.  These Care Teams include your primary Cardiologist (physician) and Advanced Practice Providers (APPs -  Physician Assistants and Nurse Practitioners) who all work together to provide you with the care you need, when you need it.  Your next appointment:   6 months  The format for your next appointment:   Virtual Visit  Provider:   Dr.Koneswaran  Other Instructions None     Thank you for choosing [facility] !

## 2019-04-15 ENCOUNTER — Telehealth: Payer: Self-pay | Admitting: Cardiovascular Disease

## 2019-04-15 NOTE — Telephone Encounter (Signed)
John [name] ws questioning potassium dosage. Per chart, pt is to take 60 meq- three times daily. She voiced understanding.

## 2019-04-15 NOTE — Telephone Encounter (Signed)
Please give [place] w/ [facility] a call concerning the pt's potassium     [phone]

## 2019-04-16 ENCOUNTER — Ambulatory Visit (INDEPENDENT_AMBULATORY_CARE_PROVIDER_SITE_OTHER): Payer: Medicare HMO | Admitting: *Deleted

## 2019-04-16 DIAGNOSIS — I442 Atrioventricular block, complete: Secondary | ICD-10-CM

## 2019-04-16 LAB — CUP PACEART REMOTE DEVICE CHECK
Battery Impedance: 207 Ohm
Battery Remaining Longevity: 102 mo
Battery Voltage: 2.79 V
Brady Statistic AP VP Percent: 12 %
Brady Statistic AP VS Percent: 0 %
Brady Statistic AS VP Percent: 24 %
Brady Statistic AS VS Percent: 64 %
Date Time Interrogation Session: 20210121074140
Implantable Lead Implant Date: 20170605
Implantable Lead Implant Date: 20170605
Implantable Lead Location: 753859
Implantable Lead Location: 753860
Implantable Lead Model: 5076
Implantable Lead Model: 5076
Implantable Pulse Generator Implant Date: 20170605
Lead Channel Impedance Value: 408 Ohm
Lead Channel Impedance Value: 501 Ohm
Lead Channel Pacing Threshold Amplitude: 0.625 V
Lead Channel Pacing Threshold Amplitude: 0.875 V
Lead Channel Pacing Threshold Pulse Width: 0.4 ms
Lead Channel Pacing Threshold Pulse Width: 0.4 ms
Lead Channel Setting Pacing Amplitude: 2 V
Lead Channel Setting Pacing Amplitude: 2.5 V
Lead Channel Setting Pacing Pulse Width: 0.4 ms
Lead Channel Setting Sensing Sensitivity: 4 mV

## 2019-04-27 ENCOUNTER — Other Ambulatory Visit: Payer: Self-pay

## 2019-04-27 ENCOUNTER — Ambulatory Visit (INDEPENDENT_AMBULATORY_CARE_PROVIDER_SITE_OTHER): Payer: Medicare HMO | Admitting: "Endocrinology

## 2019-04-27 ENCOUNTER — Encounter: Payer: Self-pay | Admitting: "Endocrinology

## 2019-04-27 VITALS — BP 139/84 | HR 86 | Ht 72.0 in | Wt 235.8 lb

## 2019-04-27 DIAGNOSIS — I10 Essential (primary) hypertension: Secondary | ICD-10-CM

## 2019-04-27 DIAGNOSIS — E782 Mixed hyperlipidemia: Secondary | ICD-10-CM

## 2019-04-27 DIAGNOSIS — E1159 Type 2 diabetes mellitus with other circulatory complications: Secondary | ICD-10-CM | POA: Diagnosis not present

## 2019-04-27 LAB — POCT GLYCOSYLATED HEMOGLOBIN (HGB A1C): Hemoglobin A1C: 7.5 % — AB (ref 4.0–5.6)

## 2019-04-27 MED ORDER — METFORMIN HCL 500 MG PO TABS: 500.0000 mg | ORAL_TABLET | Freq: Two times a day (BID) | ORAL | 1 refills | Status: DC

## 2019-04-27 NOTE — Patient Instructions (Signed)
                                     Advice for Weight Management  -For most of us the best way to lose weight is by diet management. Generally speaking, diet management means consuming less calories intentionally which over time brings about progressive weight loss.  This can be achieved more effectively by restricting carbohydrate consumption to the minimum possible.  So, it is critically important to know your numbers: how much calorie you are consuming and how much calorie you need. More importantly, our carbohydrates sources should be unprocessed or minimally processed complex starch food items.   Sometimes, it is important to balance nutrition by increasing protein intake (animal or plant source), fruits, and vegetables.  -Sticking to a routine mealtime to eat 3 meals a day and avoiding unnecessary snacks is shown to have a big role in weight control. Under normal circumstances, the only time we lose real weight is when we are hungry, so allow hunger to take place- hunger means no food between meal times, only water.  It is not advisable to starve.   -It is better to avoid simple carbohydrates including: Cakes, Sweet Desserts, Ice Cream, Soda (diet and regular), Sweet Tea, Candies, Chips, Cookies, Store Bought Juices, Alcohol in Excess of  1-2 drinks a day, Artificial Sweeteners, Doughnuts, Coffee Creamers, "Sugar-free" Products, etc, etc.  This is not a complete list.....    -Consulting with certified diabetes educators is proven to provide you with the most accurate and current information on diet.  Also, you may be  interested in discussing diet options/exchanges , we can schedule a visit with Penny Crumpton, RDN, CDE for individualized nutrition education.  -Exercise: If you are able: 30 -60 minutes a day ,4 days a week, or 150 minutes a week.  The longer the better.  Combine stretch, strength, and aerobic activities.  If you were told in the past that you  have high risk for cardiovascular diseases, you may seek evaluation by your heart doctor prior to initiating moderate to intense exercise programs.                                  Additional Care Considerations for Diabetes   -Diabetes  is a chronic disease.  The most important care consideration is regular follow-up with your diabetes care provider with the goal being avoiding or delaying its complications and to take advantage of advances in medications and technology.    -Type 2 diabetes is known to coexist with other important comorbidities such as high blood pressure and high cholesterol.  It is critical to control not only the diabetes but also the high blood pressure and high cholesterol to minimize and delay the risk of complications including coronary artery disease, stroke, amputations, blindness, etc.    - Studies showed that people with diabetes will benefit from a class of medications known as ACE inhibitors and statins.  Unless there are specific reasons not to be on these medications, the standard of care is to consider getting one from these groups of medications at an optimal doses.  These medications are generally considered safe and proven to help protect the heart and the kidneys.    - People with diabetes are encouraged to initiate and maintain regular follow-up with eye doctors, foot doctors, dentists ,   and if necessary heart and kidney doctors.     - It is highly recommended that people with diabetes quit smoking or stay away from smoking, and get yearly  flu vaccine and pneumonia vaccine at least every 5 years.  One other important lifestyle recommendation is to ensure adequate sleep - at least 6-7 hours of uninterrupted sleep at night.  -Exercise: If you are able: 30 -60 minutes a day, 4 days a week, or 150 minutes a week.  The longer the better.  Combine stretch, strength, and aerobic activities.  If you were told in the past that you have high risk for cardiovascular  diseases, you may seek evaluation by your heart doctor prior to initiating moderate to intense exercise programs.     COVID-19 Vaccine Information can be found at: [email or web address] For questions related to vaccine distribution or appointments, please email [email or web address] or call [phone].        

## 2019-04-27 NOTE — Progress Notes (Signed)
04/27/2019, 2:42 PM   Endocrinology follow-up note    Subjective:    Patient ID: John [name], male    DOB: [birth year].  John [name] is here to follow-up for the management of his currently uncontrolled type 2 diabetes, hyperlipidemia.    PMD:  Mikey Kirschner, MD.   Past Medical History:  Diagnosis Date  . Atrial flutter ([place]) 12/2010   Admitted with symptomatic bradycardia (HR 40s), atrial flutter with slow ventricular response 12/2010 + volume overload; AV nodal agents d/c'd and Pradaxa started; RFA in 01/2011  . CHF (congestive heart failure) ([place])   . Chronic combined systolic and diastolic heart failure ([place])    a. echo 01/06/11: mild LVH, EF 65-70%, mod to severe LAE, mild RVE, mild RAE, PASP 32;   TEE 10/12: EF 45-50% b. EF 45 to 50% by echo in 07/2018  . Class 2 severe obesity due to excess calories with serious comorbidity and body mass index (BMI) of 37.0 to 37.9 in adult ([place]) 07/17/2017  . Diabetes mellitus    non insulin dependant  . Hyperlipidemia   . Hypertension   . Osteoarthritis   . Presence of permanent cardiac pacemaker   . PSVT (paroxysmal supraventricular tachycardia) (H[facility])    Possibly atrial flutter   Past Surgical History:  Procedure Laterality Date  . ATRIAL FLUTTER ABLATION N/A 02/07/2011   Procedure: ATRIAL FLUTTER ABLATION;  Surgeon: Evans Lance, MD;  Location: [facility] CATH LAB;  Service: Cardiovascular;  Laterality: N/A;  . CARDIAC ELECTROPHYSIOLOGY STUDY AND ABLATION  02/07/11  . CARDIOVERSION N/A 03/23/2016   Procedure: CARDIOVERSION;  Surgeon: Evans Lance, MD;  Location: [facility];  Service: Cardiovascular;  Laterality: N/A;  . COLONOSCOPY N/A 04/17/2017   Procedure: COLONOSCOPY;  Surgeon: Rogene Houston, MD;  [facility];  Service: Endoscopy;  Laterality: N/A;  830  . EP IMPLANTABLE DEVICE N/A 08/29/2015   Procedure: Pacemaker  Implant;  Surgeon: Evans Lance, MD;  Location: [facility] CV LAB;  Service: Cardiovascular;  Laterality: N/A;  . EP IMPLANTABLE DEVICE N/A 12/07/2015   Procedure: PPM Lead Revision/Repair;  Surgeon: Evans Lance, MD;  Location: [facility] CV LAB;  Service: Cardiovascular;  Laterality: N/A;  . KNEE ARTHROSCOPY  2005   left  . LUMBAR SPINE SURGERY     "I've had 6 ORs 1972 thru 2004"  . POLYPECTOMY  04/17/2017   Procedure: POLYPECTOMY;  Surgeon: Rogene Houston, MD;  Location: [facility];  Service: Endoscopy;;  transverse colon x3;   Social History   Socioeconomic History  . Marital status: Married    Spouse name: Not on file  . Number of children: Not on file  . Years of education: Not on file  . Highest education level: Not on file  Occupational History  . Occupation: Immunologist: RETIRED  Tobacco Use  . Smoking status: Former Smoker    Packs/day: 1.00    Years: 10.00    Pack years: 10.00    Types: Cigarettes    Quit date: 03/26/1986    Years  since quitting: 33.1  . Smokeless tobacco: Never Used  . Tobacco comment: "stopped cigarette  smoking 1988"  Substance and Sexual Activity  . Alcohol use: Never    Alcohol/week: 0.0 standard drinks    Comment: "quit alcohol ~ 2007"  . Drug use: No  . Sexual activity: Yes    Partners: Female  Other Topics Concern  . Not on file  Social History Narrative  . Not on file   Social Determinants of Health   Financial Resource Strain: Low Risk   . Difficulty of Paying Living Expenses: Not hard at all  Food Insecurity: No Food Insecurity  . Worried About Charity fundraiser in the Last Year: Never true  . Ran Out of Food in the Last Year: Never true  Transportation Needs: No Transportation Needs  . Lack of Transportation (Medical): No  . Lack of Transportation (Non-Medical): No  Physical Activity: Insufficiently Active  . Days of Exercise per Week: 3 days  . Minutes of Exercise per Session: 20 min   Stress: No Stress Concern Present  . Feeling of Stress : Not at all  Social Connections: Not Isolated  . Frequency of Communication with Friends and Family: More than three times a week  . Frequency of Social Gatherings with Friends and Family: More than three times a week  . Attends Religious Services: More than 4 times per year  . Active Member of Clubs or Organizations: Yes  . Attends Archivist Meetings: More than 4 times per year  . Marital Status: Married   Outpatient Encounter Medications as of 04/27/2019  Medication Sig  . acetaminophen (TYLENOL) 500 MG tablet Take 1,000 mg by mouth every 6 (six) hours as needed for moderate pain (thigh pain).  . benazepril (LOTENSIN) 40 MG tablet TAKE 1 TABLET EVERY DAY  . benzonatate (TESSALON) 100 MG capsule TAKE 1 CAPSULE THREE TIMES DAILY AS NEEDED FOR COUGH  . carvedilol (COREG) 25 MG tablet TAKE 1 TABLET (25 MG TOTAL) BY MOUTH 2 (TWO) TIMES DAILY WITH A MEAL.  Marland Kitchen ELIQUIS 5 MG TABS tablet TAKE 1 TABLET TWICE DAILY (Patient taking differently: Take 5 mg by mouth 2 (two) times daily. )  . HYDROcodone-acetaminophen (NORCO/VICODIN) 5-325 MG tablet Take one tablet po every 4 hrs prn pain (Patient taking differently: Take 1 tablet by mouth every 4 (four) hours as needed for moderate pain. )  . Insulin Pen Needle (BD PEN NEEDLE NANO U/F) 32G X 4 MM MISC 1 each by Does not apply route 4 (four) times daily.  . Magnesium 400 MG CAPS Take 400 mg by mouth daily.   . metFORMIN (GLUCOPHAGE) 500 MG tablet Take 1 tablet (500 mg total) by mouth 2 (two) times daily with a meal.  . metolazone (ZAROXOLYN) 2.5 MG tablet Take 1 tablet (2.5 mg total) by mouth 2 (two) times a week. Monday and Friday  . Multiple Vitamins-Minerals (MULTIVITAMIN WITH MINERALS) tablet Take 1 tablet by mouth daily.  . potassium chloride SA (KLOR-CON) 20 MEQ tablet Take 60 meq in the am, at lunch, and in the evening  . Resveratrol 100 MG CAPS Take 100 mg by mouth at bedtime.   .  rosuvastatin (CRESTOR) 20 MG tablet TAKE 1 TABLET EVERY DAY  . sildenafil (VIAGRA) 50 MG tablet Take 1 tablet (50 mg total) by mouth daily as needed for erectile dysfunction.  . torsemide (DEMADEX) 20 MG tablet TAKE 3 TABLETS (60 MG TOTAL) BY MOUTH 2 (TWO) TIMES DAILY. DOSE CHANGE.   Marland Kitchen  TRESIBA FLEXTOUCH 200 UNIT/ML SOPN INJECT 70 UNITS SUBCUTANEOUSLY AT BEDTIME  . TRULICITY 1.5 WF/0.9NA SOPN INJECT 1.5MG (1 PEN) SUBCUTANEOUSLY EVERY WEEK  . [DISCONTINUED] metFORMIN (GLUCOPHAGE) 500 MG tablet Take 1 tablet (500 mg total) by mouth 2 (two) times daily with a meal.   No facility-administered encounter medications on file as of 04/27/2019.    ALLERGIES: No Known Allergies  VACCINATION STATUS: Immunization History  Administered Date(s) Administered  . Influenza,inj,Quad PF,6+ Mos 11/24/2014, 11/29/2015, 12/11/2016, 12/25/2017, 12/20/2018  . Influenza-Unspecified 11/24/2013  . Pneumococcal Conjugate-13 11/19/2013  . Pneumococcal Polysaccharide-23 11/29/2015  . Pneumococcal-Unspecified 03/24/2009  . Td 06/23/2010    Diabetes He presents for his follow-up (Telephone visit due to coronavirus pandemic) diabetic visit. He has type 2 diabetes mellitus. Onset time: He was diagnosed at approximate age of 34 years. His disease course has been improving. There are no hypoglycemic associated symptoms. Pertinent negatives for hypoglycemia include no confusion, pallor or seizures. There are no diabetic associated symptoms. Pertinent negatives for diabetes include no fatigue, no polydipsia, no polyphagia, no polyuria and no weakness. There are no hypoglycemic complications. Symptoms are improving. Diabetic complications include heart disease and nephropathy. Risk factors for coronary artery disease include diabetes mellitus, dyslipidemia, family history, obesity, male sex, hypertension, sedentary lifestyle and tobacco exposure. Current diabetic treatment includes insulin injections. He is following a generally  unhealthy diet. When asked about meal planning, he reported none. He has not had a previous visit with a dietitian. He never participates in exercise. His home blood glucose trend is decreasing steadily. His breakfast blood glucose range is generally 130-140 mg/dl. His bedtime blood glucose range is generally 140-180 mg/dl. His overall blood glucose range is 140-180 mg/dl. An ACE inhibitor/angiotensin II receptor blocker is being taken. He does not see a podiatrist.Eye exam is current.  Hyperlipidemia This is a chronic problem. The current episode started more than 1 year ago. The problem is controlled. Exacerbating diseases include diabetes and obesity. Pertinent negatives include no myalgias. Current antihyperlipidemic treatment includes statins. Risk factors for coronary artery disease include diabetes mellitus, dyslipidemia, family history, obesity, male sex, hypertension and a sedentary lifestyle.    Review of systems  Constitutional: + Minimally fluctuating body weight,  current  Body mass index is 31.98 kg/m. , no fatigue, no subjective hyperthermia, no subjective hypothermia Eyes: no blurry vision, no xerophthalmia ENT: no sore throat, no nodules palpated in throat, no dysphagia/odynophagia, no hoarseness Cardiovascular: no Chest Pain, no Shortness of Breath, no palpitations, no leg swelling Respiratory: no cough, no shortness of breath Gastrointestinal: no Nausea/Vomiting/Diarhhea Musculoskeletal: no muscle/joint aches Skin: no rashes Neurological: no tremors, no numbness, no tingling, no dizziness Psychiatric: no depression, no anxiety    Objective:      Physical Exam- Limited  Constitutional:  Body mass index is 31.98 kg/m. , not in acute distress, normal state of mind Eyes:  EOMI, no exophthalmos Neck: Supple Respiratory: Adequate breathing efforts Musculoskeletal: no gross deformities, strength intact in all four extremities, no gross restriction of joint movements Skin:   no rashes, no hyperemia Neurological: no tremor with outstretched hands.  CMP     Component Value Date/Time   NA 137 02/26/2019 1000   K 4.7 02/26/2019 1000   CL 99 02/26/2019 1000   CO2 23 02/26/2019 1000   GLUCOSE 158 (H) 02/26/2019 1000   GLUCOSE 118 (H) 01/27/2019 1007   BUN 17 02/26/2019 1000   CREATININE 1.34 (H) 02/26/2019 1000   CREATININE 1.32 (H) 03/07/2016 1043   CALCIUM 9.6  02/26/2019 1000   PROT 8.4 (H) 12/15/2018 1014   PROT 8.0 09/12/2018 0857   ALBUMIN 3.4 (L) 12/15/2018 1014   ALBUMIN 4.1 09/12/2018 0857   AST 57 (H) 12/15/2018 1014   ALT 36 12/15/2018 1014   ALKPHOS 207 (H) 12/15/2018 1014   BILITOT 2.9 (H) 12/15/2018 1014   BILITOT 1.6 (H) 09/12/2018 0857   GFRNONAA 52 (L) 02/26/2019 1000   GFRAA 60 02/26/2019 1000     Diabetic Labs (most recent): Lab Results  Component Value Date   HGBA1C 7.5 (A) 04/27/2019   HGBA1C 8.3 (H) 12/15/2018   HGBA1C 7.8 (H) 09/12/2018     Lipid Panel     Component Value Date/Time   CHOL 142 01/17/2018 0835   TRIG 90 01/17/2018 0835   HDL 53 01/17/2018 0835   CHOLHDL 2.7 01/17/2018 0835   CHOLHDL 3.2 11/16/2013 0702   VLDL 45 (H) 11/16/2013 0702   LDLCALC 71 01/17/2018 0835      Lab Results  Component Value Date   TSH 1.940 07/10/2017   TSH 1.563 08/25/2015   TSH 1.206 01/05/2011   FREET4 1.12 07/10/2017        Assessment & Plan:   1. DM type 2 causing vascular disease ([place])  - John [name] has currently uncontrolled symptomatic type 2 DM since 74 years of age. -He reports glycemic profile controlled to target both fasting and postprandial.  Prebreakfast readings range from 79-115, bedtime readings range from 107-222. -His average blood glucose is 125-143 in the last 30 days with point-of-care A1c of 7.5% improving from 8.3%.   He did not document no report of hypoglycemia.   -He was recently hospitalized for CHF exacerbation, was discharged on diuretics and potassium supplement.     -He does  not report any major hypoglycemia. Recent labs reviewed.  -his diabetes is complicated by obesity/sedentary life , CKD, CHF , congestive heart failure/bradyarrhythmia and KASCH BORQUEZ remains at a high risk for more acute and chronic complications which include CAD, CVA, CKD, retinopathy, and neuropathy. These are all discussed in detail with the patient.  - I encouraged him to switch to  unprocessed or minimally processed complex starch and increased protein intake (animal or plant source), fruits, and vegetables.  - he  admits there is a room for improvement in his diet and drink choices. -  Suggestion is made for him to avoid simple carbohydrates  from his diet including Cakes, Sweet Desserts / Pastries, Ice Cream, Soda (diet and regular), Sweet Tea, Candies, Chips, Cookies, Sweet Pastries,  Store Bought Juices, Alcohol in Excess of  1-2 drinks a day, Artificial Sweeteners, Coffee Creamer, and "Sugar-free" Products. This will help patient to have stable blood glucose profile and potentially avoid unintended weight gain.   - he is advised to stick to a routine mealtimes to eat 3 meals  a day and avoid unnecessary snacks ( to snack only to correct hypoglycemia).    - I have approached him with the following individualized plan to manage diabetes and patient agrees:   -Based on his current glycemic response, he will not require prandial insulin for now.   -Based on his presentation with target glycemic profile, she will not require prandial insulin for now.   -He is advised to continue Tresiba 70 units nightly, continue Metformin 500 mg p.o. twice daily, and Trulicity 1.5 mg subcutaneously weekly.   - he is advised to continue  strict monitoring of blood glucose 2 times a day-daily before breakfast  and at bedtime. - Patient is warned not to take insulin without proper monitoring per orders.  -Patient is encouraged to call clinic for blood glucose levels less than 70 or above 200 mg /dl.   He will not tolerate higher dose of metformin due to CHF and recent acute on chronic renal insufficiency.   2) Lipids/HPL:    His recent lipid panel shows controlled LDL at 53.  He will continue to benefit from statin treatment, advised to continue Crestor 20 mg p.o. nightly.     3) hypertension-his blood pressure is controlled to target. -He is advised to continue on his torsemide as needed, carvedilol 25 mg p.o. twice daily.  He is advised to continue follow-up with his cardiologist.  - I advised patient to maintain close follow up with Mikey Kirschner, MD for primary care needs.  - Time spent on this patient care encounter:  35 min, of which > 50% was spent in  counseling and the rest reviewing his blood glucose logs , discussing his hypoglycemia and hyperglycemia episodes, reviewing his current and  previous labs / studies  ( including abstraction from other facilities) and medications  doses and developing a  long term treatment plan and documenting his care.   Please refer to Patient Instructions for Blood Glucose Monitoring and Insulin/Medications Dosing Guide"  in media tab for additional information. Please  also refer to " Patient Self Inventory" in the Media  tab for reviewed elements of pertinent patient history.  John [name] participated in the discussions, expressed understanding, and voiced agreement with the above plans.  All questions were answered to his satisfaction. he is encouraged to contact clinic should he have any questions or concerns prior to his return visit.   Follow up plan: - Return in about 4 months (around 08/25/2019) for Follow up with Meter and Logs Only - no Labs.  Glade Lloyd, MD [facility] [address] [place], [place] [address] Phone: [phone]  Fax: [phone]    04/27/2019, 2:42 PM  This note was partially dictated with voice recognition software. Similar sounding words can be  transcribed inadequately or may not  be corrected upon review.

## 2019-04-30 ENCOUNTER — Encounter: Payer: Self-pay | Admitting: Family Medicine

## 2019-05-03 ENCOUNTER — Other Ambulatory Visit: Payer: Self-pay

## 2019-05-03 ENCOUNTER — Ambulatory Visit: Payer: Medicare HMO | Attending: Internal Medicine

## 2019-05-03 DIAGNOSIS — Z23 Encounter for immunization: Secondary | ICD-10-CM | POA: Insufficient documentation

## 2019-05-03 NOTE — Progress Notes (Signed)
   Covid-19 Vaccination Clinic  Name:  John [name]    MRN: [id number] DOB: 18-Sep-1945  05/03/2019  Mr. Trulson was observed post Covid-19 immunization for 15 minutes without incidence. He was provided with Vaccine Information Sheet and instruction to access the V-Safe system.   Mr. Wolters was instructed to call 911 with any severe reactions post vaccine: Marland Kitchen Difficulty breathing  . Swelling of your face and throat  . A fast heartbeat  . A bad rash all over your body  . Dizziness and weakness    Immunizations Administered    Name Date Dose VIS Date Route   Moderna COVID-19 Vaccine 05/03/2019  1:12 PM 0.5 mL 02/24/2019 Intramuscular   Manufacturer: Moderna   Lot: [id number]   [place]: [id number]

## 2019-05-04 ENCOUNTER — Other Ambulatory Visit: Payer: Self-pay | Admitting: "Endocrinology

## 2019-05-13 ENCOUNTER — Other Ambulatory Visit: Payer: Self-pay | Admitting: Internal Medicine

## 2019-06-03 ENCOUNTER — Ambulatory Visit: Payer: Medicare HMO | Attending: Internal Medicine

## 2019-06-03 ENCOUNTER — Other Ambulatory Visit: Payer: Self-pay

## 2019-06-03 DIAGNOSIS — Z23 Encounter for immunization: Secondary | ICD-10-CM

## 2019-06-03 NOTE — Progress Notes (Signed)
   Covid-19 Vaccination Clinic  Name:  John [name]    MRN: [id number] DOB: Oct 23, 1945  06/03/2019  [name] was observed post Covid-19 immunization for 15 minutes without incident. He was provided with Vaccine Information Sheet and instruction to access the V-Safe system.   [name] was instructed to call 911 with any severe reactions post vaccine: Marland Kitchen Difficulty breathing  . Swelling of face and throat  . A fast heartbeat  . A bad rash all over body  . Dizziness and weakness   Immunizations Administered    Name Date Dose VIS Date Route   Moderna COVID-19 Vaccine 06/03/2019  1:36 PM 0.5 mL 02/24/2019 Intramuscular   Manufacturer: Moderna   Lot: [id number]   [place]: [id number]

## 2019-06-09 DIAGNOSIS — I5043 Acute on chronic combined systolic (congestive) and diastolic (congestive) heart failure: Secondary | ICD-10-CM | POA: Diagnosis not present

## 2019-06-09 DIAGNOSIS — I10 Essential (primary) hypertension: Secondary | ICD-10-CM | POA: Diagnosis not present

## 2019-07-07 ENCOUNTER — Other Ambulatory Visit: Payer: Self-pay | Admitting: "Endocrinology

## 2019-07-16 ENCOUNTER — Ambulatory Visit (INDEPENDENT_AMBULATORY_CARE_PROVIDER_SITE_OTHER): Payer: Medicare HMO | Admitting: *Deleted

## 2019-07-16 DIAGNOSIS — I442 Atrioventricular block, complete: Secondary | ICD-10-CM

## 2019-07-17 NOTE — Progress Notes (Signed)
PPM Remote  

## 2019-08-06 ENCOUNTER — Encounter: Payer: Self-pay | Admitting: Family Medicine

## 2019-08-06 ENCOUNTER — Ambulatory Visit (INDEPENDENT_AMBULATORY_CARE_PROVIDER_SITE_OTHER): Payer: Medicare HMO | Admitting: Family Medicine

## 2019-08-06 ENCOUNTER — Other Ambulatory Visit: Payer: Self-pay

## 2019-08-06 VITALS — BP 134/90 | Temp 98.1°F | Wt 238.8 lb

## 2019-08-06 DIAGNOSIS — Z125 Encounter for screening for malignant neoplasm of prostate: Secondary | ICD-10-CM

## 2019-08-06 DIAGNOSIS — R21 Rash and other nonspecific skin eruption: Secondary | ICD-10-CM | POA: Diagnosis not present

## 2019-08-06 DIAGNOSIS — I10 Essential (primary) hypertension: Secondary | ICD-10-CM | POA: Diagnosis not present

## 2019-08-06 DIAGNOSIS — E119 Type 2 diabetes mellitus without complications: Secondary | ICD-10-CM | POA: Diagnosis not present

## 2019-08-06 DIAGNOSIS — E785 Hyperlipidemia, unspecified: Secondary | ICD-10-CM

## 2019-08-06 DIAGNOSIS — Z79899 Other long term (current) drug therapy: Secondary | ICD-10-CM | POA: Diagnosis not present

## 2019-08-06 MED ORDER — HYDROCORTISONE 2.5 % EX CREA: TOPICAL_CREAM | CUTANEOUS | 3 refills | Status: DC

## 2019-08-06 MED ORDER — BENAZEPRIL HCL 40 MG PO TABS: 40.0000 mg | ORAL_TABLET | Freq: Every day | ORAL | 1 refills | Status: DC

## 2019-08-06 MED ORDER — ROSUVASTATIN CALCIUM 20 MG PO TABS: 20.0000 mg | ORAL_TABLET | Freq: Every day | ORAL | 1 refills | Status: DC

## 2019-08-06 NOTE — Progress Notes (Signed)
   Subjective:    Patient ID: John [name], male    DOB: 1945/09/30, 74 y.o.   MRN: [id number]  Rash This is a new problem. Location: upper arms and chest. Past treatments include nothing.  Pt states his potassium became extremely low and after that he began to notice the rash.   Blood pressure medicine and blood pressure levels reviewed today with patient. Compliant with blood pressure medicine. States does not miss a dose. No obvious side effects. Blood pressure generally good when checked elsewhere. Watching salt intake.   Patient continues to take lipid medication regularly. No obvious side effects from it. Generally does not miss a dose. Prior blood work results are reviewed with patient. Patient continues to work on fat intake in diet  Rash is nonpruritic.  6 months duration.  Has tried no prescription meds.  States it first came up when potassium was low  Review of Systems  Skin: Positive for rash.       Objective:   Physical Exam  Alert and oriented, vitals reviewed and stable, NAD ENT-TM's and ext canals WNL bilat via otoscopic exam Soft palate, tonsils and post pharynx WNL via oropharyngeal exam Neck-symmetric, no masses; thyroid nonpalpable and nontender Pulmonary-no tachypnea or accessory muscle use; Clear without wheezes via auscultation Card--no abnrml murmurs, rhythm reg and rate WNL Carotid pulses symmetric, without bruits Rash linear post inflammatory fading scars on bilateral lower abdomen and upper shoulders.  Classic position for self-induced but patient claims not scratching and not itching.  Prostate gland within normal limits    Assessment & Plan:  Impression 1 hypertension good control discussed maintain same meds  2.  Hyperlipidemia.  Status uncertain need to do blood work  3.  Recent renal insufficiency.  Also need to assess  4.  Rash nonspecific.  With hallmarks evident for cutaneous dermatitis secondary to scratching patient claims not touching  the rash.  Will try hydrocortisone 2% twice daily  5.  Wellness issues.  No physical for greater than 1 year went ahead and did prostate exam today within normal limits.  We will also do appropriate yearly testing.  Follow-up in 6 months

## 2019-08-12 ENCOUNTER — Other Ambulatory Visit: Payer: Self-pay

## 2019-08-12 ENCOUNTER — Other Ambulatory Visit (HOSPITAL_COMMUNITY)
Admission: RE | Admit: 2019-08-12 | Discharge: 2019-08-12 | Disposition: A | Discharge status: Home / Self Care | Payer: Medicare HMO | Source: Ambulatory Visit | Attending: Family Medicine | Admitting: Family Medicine

## 2019-08-12 DIAGNOSIS — Z125 Encounter for screening for malignant neoplasm of prostate: Secondary | ICD-10-CM | POA: Diagnosis not present

## 2019-08-12 DIAGNOSIS — E785 Hyperlipidemia, unspecified: Secondary | ICD-10-CM | POA: Diagnosis not present

## 2019-08-12 DIAGNOSIS — Z79899 Other long term (current) drug therapy: Secondary | ICD-10-CM | POA: Insufficient documentation

## 2019-08-12 DIAGNOSIS — E119 Type 2 diabetes mellitus without complications: Secondary | ICD-10-CM | POA: Insufficient documentation

## 2019-08-12 DIAGNOSIS — I10 Essential (primary) hypertension: Secondary | ICD-10-CM | POA: Insufficient documentation

## 2019-08-12 LAB — BASIC METABOLIC PANEL
Anion gap: 14 (ref 5–15)
BUN: 31 mg/dL — ABNORMAL HIGH (ref 8–23)
CO2: 27 mmol/L (ref 22–32)
Calcium: 9.1 mg/dL (ref 8.9–10.3)
Chloride: 96 mmol/L — ABNORMAL LOW (ref 98–111)
Creatinine, Ser: 1.39 mg/dL — ABNORMAL HIGH (ref 0.61–1.24)
GFR calc Af Amer: 58 mL/min — ABNORMAL LOW (ref >60)
GFR calc non Af Amer: 50 mL/min — ABNORMAL LOW (ref >60)
Glucose, Bld: 80 mg/dL (ref 70–99)
Potassium: 3.3 mmol/L — ABNORMAL LOW (ref 3.5–5.1)
Sodium: 137 mmol/L (ref 135–145)

## 2019-08-12 LAB — CBC WITH DIFFERENTIAL/PLATELET
Abs Immature Granulocytes: 0.04 10*3/uL (ref 0.00–0.07)
Basophils Absolute: 0.1 10*3/uL (ref 0.0–0.1)
Basophils Relative: 1 %
Eosinophils Absolute: 0.1 10*3/uL (ref 0.0–0.5)
Eosinophils Relative: 1 %
HCT: 45.1 % (ref 39.0–52.0)
Hemoglobin: 15 g/dL (ref 13.0–17.0)
Immature Granulocytes: 1 %
Lymphocytes Relative: 21 %
Lymphs Abs: 1.4 10*3/uL (ref 0.7–4.0)
MCH: 30.1 pg (ref 26.0–34.0)
MCHC: 33.3 g/dL (ref 30.0–36.0)
MCV: 90.4 fL (ref 80.0–100.0)
Monocytes Absolute: 0.7 10*3/uL (ref 0.1–1.0)
Monocytes Relative: 11 %
Neutro Abs: 4.4 10*3/uL (ref 1.7–7.7)
Neutrophils Relative %: 65 %
Platelets: 93 10*3/uL — ABNORMAL LOW (ref 150–400)
RBC: 4.99 MIL/uL (ref 4.22–5.81)
RDW: 17.2 % — ABNORMAL HIGH (ref 11.5–15.5)
WBC: 6.6 10*3/uL (ref 4.0–10.5)
nRBC: 0 % (ref 0.0–0.2)

## 2019-08-12 LAB — HEPATIC FUNCTION PANEL
ALT: 28 U/L (ref 0–44)
AST: 45 U/L — ABNORMAL HIGH (ref 15–41)
Albumin: 3.5 g/dL (ref 3.5–5.0)
Alkaline Phosphatase: 152 U/L — ABNORMAL HIGH (ref 38–126)
Bilirubin, Direct: 0.9 mg/dL — ABNORMAL HIGH (ref 0.0–0.2)
Indirect Bilirubin: 2.2 mg/dL — ABNORMAL HIGH (ref 0.3–0.9)
Total Bilirubin: 3.1 mg/dL — ABNORMAL HIGH (ref 0.3–1.2)
Total Protein: 8 g/dL (ref 6.5–8.1)

## 2019-08-12 LAB — LIPID PANEL
Cholesterol: 178 mg/dL (ref 0–200)
HDL: 51 mg/dL (ref >40)
LDL Cholesterol: 107 mg/dL — ABNORMAL HIGH (ref 0–99)
Total CHOL/HDL Ratio: 3.5 RATIO
Triglycerides: 100 mg/dL (ref <150)
VLDL: 20 mg/dL (ref 0–40)

## 2019-08-12 LAB — PSA: Prostatic Specific Antigen: 1.36 ng/mL (ref 0.00–4.00)

## 2019-08-13 ENCOUNTER — Other Ambulatory Visit: Payer: Self-pay | Admitting: Cardiovascular Disease

## 2019-08-26 ENCOUNTER — Encounter: Payer: Self-pay | Admitting: "Endocrinology

## 2019-08-26 ENCOUNTER — Other Ambulatory Visit: Payer: Self-pay

## 2019-08-26 ENCOUNTER — Ambulatory Visit (INDEPENDENT_AMBULATORY_CARE_PROVIDER_SITE_OTHER): Payer: Medicare HMO | Admitting: "Endocrinology

## 2019-08-26 VITALS — BP 132/82 | HR 83 | Ht 72.0 in | Wt 241.0 lb

## 2019-08-26 DIAGNOSIS — I10 Essential (primary) hypertension: Secondary | ICD-10-CM | POA: Diagnosis not present

## 2019-08-26 DIAGNOSIS — E1159 Type 2 diabetes mellitus with other circulatory complications: Secondary | ICD-10-CM

## 2019-08-26 DIAGNOSIS — E6609 Other obesity due to excess calories: Secondary | ICD-10-CM | POA: Diagnosis not present

## 2019-08-26 DIAGNOSIS — E782 Mixed hyperlipidemia: Secondary | ICD-10-CM | POA: Diagnosis not present

## 2019-08-26 DIAGNOSIS — Z6832 Body mass index (BMI) 32.0-32.9, adult: Secondary | ICD-10-CM | POA: Diagnosis not present

## 2019-08-26 LAB — POCT GLYCOSYLATED HEMOGLOBIN (HGB A1C): Hemoglobin A1C: 7.1 % — AB (ref 4.0–5.6)

## 2019-08-26 MED ORDER — TRESIBA FLEXTOUCH 200 UNIT/ML ~~LOC~~ SOPN: 60.0000 [IU] | PEN_INJECTOR | Freq: Every day | SUBCUTANEOUS | 3 refills | Status: DC

## 2019-08-26 NOTE — Patient Instructions (Signed)

## 2019-08-26 NOTE — Progress Notes (Signed)
08/26/2019, 4:27 PM   Endocrinology follow-up note    Subjective:    Patient ID: John [name], male    DOB: 1945-09-17.  John [name] is here to follow-up for the management of his currently uncontrolled type 2 diabetes, hyperlipidemia.    PMD:  Mikey Kirschner, MD.   Past Medical History:  Diagnosis Date  . Atrial flutter ([place]) 12/2010   Admitted with symptomatic bradycardia (HR 40s), atrial flutter with slow ventricular response 12/2010 + volume overload; AV nodal agents d/c'd and Pradaxa started; RFA in 01/2011  . CHF (congestive heart failure) ([place])   . Chronic combined systolic and diastolic heart failure ([place])    a. echo 01/06/11: mild LVH, EF 65-70%, mod to severe LAE, mild RVE, mild RAE, PASP 32;   TEE 10/12: EF 45-50% b. EF 45 to 50% by echo in 07/2018  . Class 2 severe obesity due to excess calories with serious comorbidity and body mass index (BMI) of 37.0 to 37.9 in adult ([place]) 07/17/2017  . Diabetes mellitus    non insulin dependant  . Hyperlipidemia   . Hypertension   . Osteoarthritis   . Presence of permanent cardiac pacemaker   . PSVT (paroxysmal supraventricular tachycardia) (HCC)    Possibly atrial flutter   Past Surgical History:  Procedure Laterality Date  . ATRIAL FLUTTER ABLATION N/A 02/07/2011   Procedure: ATRIAL FLUTTER ABLATION;  Surgeon: Evans Lance, MD;  Location: [facility] CATH LAB;  Service: Cardiovascular;  Laterality: N/A;  . CARDIAC ELECTROPHYSIOLOGY STUDY AND ABLATION  02/07/11  . CARDIOVERSION N/A 03/23/2016   Procedure: CARDIOVERSION;  Surgeon: Evans Lance, MD;  Location: [facility];  Service: Cardiovascular;  Laterality: N/A;  . COLONOSCOPY N/A 04/17/2017   Procedure: COLONOSCOPY;  Surgeon: Rogene Houston, MD;  Location[facility];  Service: Endoscopy;  Laterality: N/A;  830  . EP IMPLANTABLE DEVICE N/A 08/29/2015   Procedure: Pacemaker  Implant;  Surgeon: Evans Lance, MD;  Location: [facility] CV LAB;  Service: Cardiovascular;  Laterality: N/A;  . EP IMPLANTABLE DEVICE N/A 12/07/2015   Procedure: PPM Lead Revision/Repair;  Surgeon: Evans Lance, MD;  Location: [place] CV LAB;  Service: Cardiovascular;  Laterality: N/A;  . KNEE ARTHROSCOPY  2005   left  . LUMBAR SPINE SURGERY     "I've had 6 ORs 1972 thru 2004"  . POLYPECTOMY  04/17/2017   Procedure: POLYPECTOMY;  Surgeon: Rogene Houston, MD;  Location: [facility];  Service: Endoscopy;;  transverse colon x3;   Social History   Socioeconomic History  . Marital status: Married    Spouse name: Not on file  . Number of children: Not on file  . Years of education: Not on file  . Highest education level: Not on file  Occupational History  . Occupation: Immunologist: RETIRED  Tobacco Use  . Smoking status: Former Smoker    Packs/day: 1.00    Years: 10.00    Pack years: 10.00    Types: Cigarettes    Quit date: 03/26/1986    Years  since quitting: 33.4  . Smokeless tobacco: Never Used  . Tobacco comment: "stopped cigarette  smoking 1988"  Substance and Sexual Activity  . Alcohol use: Never    Alcohol/week: 0.0 standard drinks    Comment: "quit alcohol ~ 2007"  . Drug use: No  . Sexual activity: Yes    Partners: Female  Other Topics Concern  . Not on file  Social History Narrative  . Not on file   Social Determinants of Health   Financial Resource Strain: Low Risk   . Difficulty of Paying Living Expenses: Not hard at all  Food Insecurity: No Food Insecurity  . Worried About Charity fundraiser in the Last Year: Never true  . Ran Out of Food in the Last Year: Never true  Transportation Needs: No Transportation Needs  . Lack of Transportation (Medical): No  . Lack of Transportation (Non-Medical): No  Physical Activity: Insufficiently Active  . Days of Exercise per Week: 3 days  . Minutes of Exercise per Session: 20 min   Stress: No Stress Concern Present  . Feeling of Stress : Not at all  Social Connections: Not Isolated  . Frequency of Communication with Friends and Family: More than three times a week  . Frequency of Social Gatherings with Friends and Family: More than three times a week  . Attends Religious Services: More than 4 times per year  . Active Member of Clubs or Organizations: Yes  . Attends Archivist Meetings: More than 4 times per year  . Marital Status: Married   Outpatient Encounter Medications as of 08/26/2019  Medication Sig  . acetaminophen (TYLENOL) 500 MG tablet Take 1,000 mg by mouth every 6 (six) hours as needed for moderate pain (thigh pain).  . benzonatate (TESSALON) 100 MG capsule TAKE 1 CAPSULE THREE TIMES DAILY AS NEEDED FOR COUGH  . carvedilol (COREG) 25 MG tablet TAKE 1 TABLET (25 MG TOTAL) BY MOUTH 2 (TWO) TIMES DAILY WITH A MEAL.  Marland Kitchen ELIQUIS 5 MG TABS tablet TAKE 1 TABLET TWICE DAILY  . HYDROcodone-acetaminophen (NORCO/VICODIN) 5-325 MG tablet Take one tablet po every 4 hrs prn pain (Patient taking differently: Take 1 tablet by mouth every 4 (four) hours as needed for moderate pain. )  . hydrocortisone 2.5 % cream Apply BID to affected area  . insulin degludec (TRESIBA FLEXTOUCH) 200 UNIT/ML FlexTouch Pen Inject 60 Units into the skin at bedtime.  . Insulin Pen Needle (BD PEN NEEDLE NANO U/F) 32G X 4 MM MISC 1 each by Does not apply route 4 (four) times daily.  . Magnesium 400 MG CAPS Take 400 mg by mouth daily.   . metFORMIN (GLUCOPHAGE) 500 MG tablet Take 1 tablet (500 mg total) by mouth 2 (two) times daily with a meal.  . metolazone (ZAROXOLYN) 2.5 MG tablet Take 1 tablet (2.5 mg total) by mouth 2 (two) times a week. Monday and Friday  . Multiple Vitamins-Minerals (MULTIVITAMIN WITH MINERALS) tablet Take 1 tablet by mouth daily.  . potassium chloride SA (KLOR-CON) 20 MEQ tablet Take 60 meq in the am, at lunch, and in the evening  . Resveratrol 100 MG CAPS Take  100 mg by mouth at bedtime.   . rosuvastatin (CRESTOR) 20 MG tablet Take 1 tablet (20 mg total) by mouth daily.  . sildenafil (VIAGRA) 50 MG tablet Take 1 tablet (50 mg total) by mouth daily as needed for erectile dysfunction.  . torsemide (DEMADEX) 20 MG tablet TAKE 3 TABLETS (60 MG TOTAL) BY MOUTH  2 (TWO) TIMES DAILY. DOSE CHANGE.   Marland Kitchen TRULICITY 1.5 YB/6.3SL SOPN INJECT 1.5MG (1 PEN) SUBCUTANEOUSLY EVERY WEEK  . [DISCONTINUED] benazepril (LOTENSIN) 40 MG tablet Take 1 tablet (40 mg total) by mouth daily.  . [DISCONTINUED] TRESIBA FLEXTOUCH 200 UNIT/ML SOPN INJECT 70 UNITS SUBCUTANEOUSLY AT BEDTIME   No facility-administered encounter medications on file as of 08/26/2019.    ALLERGIES: No Known Allergies  VACCINATION STATUS: Immunization History  Administered Date(s) Administered  . Influenza,inj,Quad PF,6+ Mos 11/24/2014, 11/29/2015, 12/11/2016, 12/25/2017, 12/20/2018  . Influenza-Unspecified 11/24/2013  . Moderna SARS-COVID-2 Vaccination 05/03/2019, 06/03/2019  . Pneumococcal Conjugate-13 11/19/2013  . Pneumococcal Polysaccharide-23 11/29/2015  . Pneumococcal-Unspecified 03/24/2009  . Td 06/23/2010    Diabetes He presents for his follow-up (Telephone visit due to coronavirus pandemic) diabetic visit. He has type 2 diabetes mellitus. Onset time: He was diagnosed at approximate age of 14 years. His disease course has been improving. There are no hypoglycemic associated symptoms. Pertinent negatives for hypoglycemia include no confusion, pallor or seizures. There are no diabetic associated symptoms. Pertinent negatives for diabetes include no fatigue, no polydipsia, no polyphagia, no polyuria and no weakness. There are no hypoglycemic complications. Symptoms are improving. Diabetic complications include heart disease and nephropathy. Risk factors for coronary artery disease include diabetes mellitus, dyslipidemia, family history, obesity, male sex, hypertension, sedentary lifestyle and  tobacco exposure. Current diabetic treatment includes insulin injections. His weight is increasing steadily. He is following a generally unhealthy diet. When asked about meal planning, he reported none. He has not had a previous visit with a dietitian. He never participates in exercise. His home blood glucose trend is decreasing steadily. His breakfast blood glucose range is generally 110-130 mg/dl. His bedtime blood glucose range is generally 110-130 mg/dl. His overall blood glucose range is 110-130 mg/dl. (He presents with controlled fasting glycemic profile ranging between 100-125.  His point-of-care A1c 7.1%, improving from 8.3%.  He does have some rare, mild hypoglycemia.) An ACE inhibitor/angiotensin II receptor blocker is being taken. He does not see a podiatrist.Eye exam is current.  Hyperlipidemia This is a chronic problem. The current episode started more than 1 year ago. The problem is controlled. Exacerbating diseases include diabetes and obesity. Pertinent negatives include no myalgias. Current antihyperlipidemic treatment includes statins. Risk factors for coronary artery disease include diabetes mellitus, dyslipidemia, family history, obesity, male sex, hypertension and a sedentary lifestyle.     Review of systems  Constitutional: + Minimally fluctuating body weight,  current  Body mass index is 32.69 kg/m. , no fatigue, no subjective hyperthermia, no subjective hypothermia Eyes: no blurry vision, no xerophthalmia ENT: no sore throat, no nodules palpated in throat, no dysphagia/odynophagia, no hoarseness Cardiovascular: no Chest Pain, no Shortness of Breath, no palpitations, no leg swelling Respiratory: no cough, no shortness of breath Gastrointestinal: no Nausea/Vomiting/Diarhhea Musculoskeletal: no muscle/joint aches Skin: no rashes, no hyperemia Neurological: no tremors, no numbness, no tingling, no dizziness Psychiatric: no depression, no anxiety   Objective:    Vitals with  BMI 08/26/2019 08/06/2019 04/27/2019  Height 6' 0"  - 6' 0"   Weight 241 lbs 238 lbs 13 oz 235 lbs 13 oz  BMI 37.34 - 28.76  Systolic 811 572 620  Diastolic 82 90 84  Pulse 83 - 86     Physical Exam- Limited  Constitutional:  Body mass index is 32.69 kg/m. , not in acute distress, normal state of mind Eyes:  EOMI, no exophthalmos Neck: Supple Respiratory: Adequate breathing efforts Musculoskeletal: no gross deformities, strength intact in all  four extremities, no gross restriction of joint movements Skin:  no rashes, no hyperemia Neurological: no tremor with outstretched hands.  CMP     Component Value Date/Time   NA 137 08/12/2019 0809   NA 137 02/26/2019 1000   K 3.3 (L) 08/12/2019 0809   CL 96 (L) 08/12/2019 0809   CO2 27 08/12/2019 0809   GLUCOSE 80 08/12/2019 0809   BUN 31 (H) 08/12/2019 0809   BUN 17 02/26/2019 1000   CREATININE 1.39 (H) 08/12/2019 0809   CREATININE 1.32 (H) 03/07/2016 1043   CALCIUM 9.1 08/12/2019 0809   PROT 8.0 08/12/2019 0809   PROT 8.0 09/12/2018 0857   ALBUMIN 3.5 08/12/2019 0809   ALBUMIN 4.1 09/12/2018 0857   AST 45 (H) 08/12/2019 0809   ALT 28 08/12/2019 0809   ALKPHOS 152 (H) 08/12/2019 0809   BILITOT 3.1 (H) 08/12/2019 0809   BILITOT 1.6 (H) 09/12/2018 0857   GFRNONAA 50 (L) 08/12/2019 0809   GFRAA 58 (L) 08/12/2019 0809     Diabetic Labs (most recent): Lab Results  Component Value Date   HGBA1C 7.1 (A) 08/26/2019   HGBA1C 7.5 (A) 04/27/2019   HGBA1C 8.3 (H) 12/15/2018    Lipid Panel     Component Value Date/Time   CHOL 178 08/12/2019 0809   CHOL 142 01/17/2018 0835   TRIG 100 08/12/2019 0809   HDL 51 08/12/2019 0809   HDL 53 01/17/2018 0835   CHOLHDL 3.5 08/12/2019 0809   VLDL 20 08/12/2019 0809   LDLCALC 107 (H) 08/12/2019 0809   LDLCALC 71 01/17/2018 0835     Lab Results  Component Value Date   TSH 1.940 07/10/2017   TSH 1.563 08/25/2015   TSH 1.206 01/05/2011   FREET4 1.12 07/10/2017        Assessment &  Plan:   1. DM type 2 causing vascular disease ([place])  - John [name] has currently uncontrolled symptomatic type 2 DM since 75 years of age.  He presents with controlled fasting glycemic profile ranging between 100-125.  His point-of-care A1c 7.1%, improving from 8.3%.  He does have some rare, mild hypoglycemia.    -He does not report any major hypoglycemia. Recent labs reviewed.  -his diabetes is complicated by obesity/sedentary life , CKD, CHF , congestive heart failure/bradyarrhythmia and JAIZON DEROOS remains at a high risk for more acute and chronic complications which include CAD, CVA, CKD, retinopathy, and neuropathy. These are all discussed in detail with the patient.  - I encouraged him to switch to  unprocessed or minimally processed complex starch and increased protein intake (animal or plant source), fruits, and vegetables.  - he  admits there is a room for improvement in his diet and drink choices. -  Suggestion is made for him to avoid simple carbohydrates  from his diet including Cakes, Sweet Desserts / Pastries, Ice Cream, Soda (diet and regular), Sweet Tea, Candies, Chips, Cookies, Sweet Pastries,  Store Bought Juices, Alcohol in Excess of  1-2 drinks a day, Artificial Sweeteners, Coffee Creamer, and "Sugar-free" Products. This will help patient to have stable blood glucose profile and potentially avoid unintended weight gain.  - he is advised to stick to a routine mealtimes to eat 3 meals  a day and avoid unnecessary snacks ( to snack only to correct hypoglycemia).    - I have approached him with the following individualized plan to manage diabetes and patient agrees:   -Based on his current glycemic response, he will not require prandial  insulin for now.    -To avoid the randomly occurring hypoglycemia, he is advised to lower his [place] to 60 units nightly, continue monitoring blood glucose twice a day-daily before breakfast and at bedtime.    -We will continue  to benefit from low-dose Metformin, advised continue Metformin 500 mg p.o. twice daily, as well as Trulicity 1.5 mg subcutaneously weekly.   - Patient is warned not to take insulin without proper monitoring per orders.  -Patient is encouraged to call clinic for blood glucose levels less than 70 or above 200 mg /dl.   2) Lipids/HPL:   His recent lipid panel showed controlled LDL at 71.  He will continue to benefit from statin treatment, advised to continue Crestor 20 mg p.o. nightly.     3) hypertension-his blood pressure is controlled to target. -He is advised to continue on his torsemide as needed, carvedilol 25 mg p.o. twice daily.  -He has mild hypokalemia at 3.3, currently on potassium supplement.  He plans to see his PMD in 2-3 weeks.  4) weight management: His BMI 32.6-a candidate for modest weight loss. -Specific carbs and exercise regimen were discussed in detail with him.   He is advised to continue follow-up with his cardiologist.  - I advised patient to maintain close follow up with Mikey Kirschner, MD for primary care needs.  - Time spent on this patient care encounter:  35 min, of which > 50% was spent in  counseling and the rest reviewing his blood glucose logs , discussing his hypoglycemia and hyperglycemia episodes, reviewing his current and  previous labs / studies  ( including abstraction from other facilities) and medications  doses and developing a  long term treatment plan and documenting his care.   Please refer to Patient Instructions for Blood Glucose Monitoring and Insulin/Medications Dosing Guide"  in media tab for additional information. Please  also refer to " Patient Self Inventory" in the Media  tab for reviewed elements of pertinent patient history.  John [name] participated in the discussions, expressed understanding, and voiced agreement with the above plans.  All questions were answered to his satisfaction. he is encouraged to contact clinic should he  have any questions or concerns prior to his return visit.    Follow up plan: - Return in about 6 months (around 02/25/2020) for Bring Meter and Logs- A1c in Office, NV Office Urine MA, NV A1c in Office.  Glade Lloyd, MD [facility] [address] [place], [place] [address] Phone: [phone]  Fax: [phone]    08/26/2019, 4:27 PM  This note was partially dictated with voice recognition software. Similar sounding words can be transcribed inadequately or may not  be corrected upon review.

## 2019-08-27 ENCOUNTER — Other Ambulatory Visit: Payer: Self-pay | Admitting: *Deleted

## 2019-08-27 DIAGNOSIS — R748 Abnormal levels of other serum enzymes: Secondary | ICD-10-CM

## 2019-08-27 DIAGNOSIS — R17 Unspecified jaundice: Secondary | ICD-10-CM

## 2019-09-09 ENCOUNTER — Encounter: Payer: Self-pay | Admitting: Family Medicine

## 2019-09-10 ENCOUNTER — Encounter (INDEPENDENT_AMBULATORY_CARE_PROVIDER_SITE_OTHER): Payer: Self-pay | Admitting: Gastroenterology

## 2019-09-15 ENCOUNTER — Telehealth: Payer: Medicare HMO | Admitting: Cardiovascular Disease

## 2019-09-25 ENCOUNTER — Other Ambulatory Visit: Payer: Self-pay | Admitting: Adult Health

## 2019-09-25 ENCOUNTER — Telehealth: Payer: Medicare HMO | Admitting: Cardiovascular Disease

## 2019-09-25 ENCOUNTER — Other Ambulatory Visit: Payer: Self-pay | Admitting: "Endocrinology

## 2019-09-30 ENCOUNTER — Other Ambulatory Visit: Payer: Self-pay | Admitting: "Endocrinology

## 2019-09-30 ENCOUNTER — Encounter: Payer: Self-pay | Admitting: Cardiology

## 2019-09-30 ENCOUNTER — Ambulatory Visit (INDEPENDENT_AMBULATORY_CARE_PROVIDER_SITE_OTHER): Payer: Medicare HMO | Admitting: Cardiology

## 2019-09-30 VITALS — BP 115/78 | HR 51 | Ht 72.0 in | Wt 249.4 lb

## 2019-09-30 DIAGNOSIS — I350 Nonrheumatic aortic (valve) stenosis: Secondary | ICD-10-CM | POA: Diagnosis not present

## 2019-09-30 DIAGNOSIS — I4891 Unspecified atrial fibrillation: Secondary | ICD-10-CM

## 2019-09-30 DIAGNOSIS — I5043 Acute on chronic combined systolic (congestive) and diastolic (congestive) heart failure: Secondary | ICD-10-CM

## 2019-09-30 MED ORDER — TORSEMIDE 20 MG PO TABS: 40.0000 mg | ORAL_TABLET | Freq: Two times a day (BID) | ORAL | 3 refills | Status: DC

## 2019-09-30 NOTE — Patient Instructions (Signed)
Your physician recommends that you schedule a follow-up appointment in: [place] physician has recommended you make the following change in your medication:   CHANGE TORSEMIDE 40 MG (2 TABLETS) TWICE DAILY   Your physician recommends that you return for lab work in: [place] BMP/MG  Your physician has requested that you have an echocardiogram. Echocardiography is a painless test that uses sound waves to create images of your heart. It provides your doctor with information about the size and shape of your heart and how well your heart's chambers and valves are working. This procedure takes approximately one hour. There are no restrictions for this procedure.  Thank you for choosing [facility]!!

## 2019-09-30 NOTE — Progress Notes (Signed)
Clinical Summary John [name] is a 74 y.o.male seen today for follow up of the following medical problems. Last seen by Dr John [name], this is our first visti togeter.   1. Chronic combined systolic/diastolic HF - 04/1113 echo LVEF 45-50%, grade II DDx, normal RV, mild aortic stenosis.  - some recent edema, some SOB/DOE. Mild orthopnea. Home weights around 243 lbs.  - compliant with meds - takes torsemide 34m in AM and 229min PM, takes metolazone prn. Takes about 1 month.   2. Aflutter - s/p ablation in 2012   3. PAF - on eliquis for stroke prevention - no recent palpitations   4. Complete heart block - had pacemaker placed in 2017 - normal device function by Jan 2021 check, upcoming check later this month  5. HTN - compliant with meds   6. Hyperlipidemia - 07/2019 T C 178 TG 100 HDL 51 LDL 107   7. CKD 3 - Cr stable 1.2 to 1.3  8. Aortic stenosis - 07/2018 echo AVA VTI 1, mean grad 11.5, DI 0.23, SVI 33 - mixed data on severity, particularly with the low SVI - I reviewed the study, morphologically looks closer to moderate AS  9. Abnormal liver tests - pcp has referred to GI    Past Medical History:  Diagnosis Date  . [place] ([name]   Admitted with symptomatic bradycardia (HR 40s), atrial flutter with slow ventricular response 12/2010 + volume overload; AV nodal agents d/c'd and Pradaxa started; RFA in 01/2011  . CH[place] (congestive heart failure) ([facility] [place]  . Chronic combined systolic and diastolic heart failure ([facility]   a. echo 01/06/11: mild LVH, EF 65-70%, mod to severe LAE, mild RVE, mild RAE, PASP 32;   TEE 10/12: EF 45-50% b. EF 45 to 50% by echo in 07/2018  . Class 2 severe obesity due to excess calories with serious comorbidity and body mass index (BMI) of 37.0 to 37.9 in adult ([facility]4/24/2019  . Diabetes mellitus    non insulin dependant  . Hyperlipidemia   . Hypertension   . Osteoarthritis   . Presence of permanent cardiac pacemaker   .  PSVT (paroxysmal supraventricular tachycardia) (HCC)    Possibly atrial flutter     No Known Allergies   Current Outpatient Medications  Medication Sig Dispense Refill  . acetaminophen (TYLENOL) 500 MG tablet Take 1,000 mg by mouth every 6 (six) hours as needed for moderate pain (thigh pain).    . benzonatate (TESSALON) 100 MG capsule TAKE 1 CAPSULE THREE TIMES DAILY AS NEEDED FOR COUGH 90 capsule 1  . carvedilol (COREG) 25 MG tablet TAKE 1 TABLET (25 MG TOTAL) BY MOUTH 2 (TWO) TIMES DAILY WITH A MEAL. 180 tablet 3  . ELIQUIS 5 MG TABS tablet TAKE 1 TABLET TWICE DAILY 180 tablet 3  . HYDROcodone-acetaminophen (NORCO/VICODIN) 5-325 MG tablet Take one tablet po every 4 hrs prn pain (Patient taking differently: Take 1 tablet by mouth every 4 (four) hours as needed for moderate pain. ) 28 tablet 0  . hydrocortisone 2.5 % cream Apply BID to affected area 60 g 3  . insulin degludec (TRESIBA FLEXTOUCH) 200 UNIT/ML FlexTouch Pen Inject 60 Units into the skin at bedtime. 10 pen 3  . Insulin Pen Needle (BD PEN NEEDLE NANO U/F) 32G X 4 MM MISC 1 each by Does not apply route 4 (four) times daily. 150 each 5  . Magnesium 400 MG CAPS Take 400 mg by mouth daily.     .Marland Kitchen  metFORMIN (GLUCOPHAGE) 500 MG tablet Take 1 tablet (500 mg total) by mouth 2 (two) times daily with a meal. 180 tablet 1  . metolazone (ZAROXOLYN) 2.5 MG tablet Take 1 tablet (2.5 mg total) by mouth 2 (two) times a week. Monday and Friday 15 tablet 1  . Multiple Vitamins-Minerals (MULTIVITAMIN WITH MINERALS) tablet Take 1 tablet by mouth daily.    . potassium chloride SA (KLOR-CON) 20 MEQ tablet Take 60 meq in the am, at lunch, and in the evening 270 tablet 6  . Resveratrol 100 MG CAPS Take 100 mg by mouth at bedtime.     . rosuvastatin (CRESTOR) 20 MG tablet Take 1 tablet (20 mg total) by mouth daily. 90 tablet 1  . sildenafil (VIAGRA) 50 MG tablet Take 1 tablet (50 mg total) by mouth daily as needed for erectile dysfunction. 30 tablet 3  .  torsemide (DEMADEX) 20 MG tablet TAKE 3 TABLETS (60 MG TOTAL) BY MOUTH 2 (TWO) TIMES DAILY. DOSE CHANGE.  540 tablet 1  . TRULICITY 1.5 ZJ/6.7HA SOPN INJECT 1.5MG (1 PEN) SUBCUTANEOUSLY EVERY WEEK 2 mL 1   No current facility-administered medications for this visit.     Past Surgical History:  Procedure Laterality Date  . ATRIAL FLUTTER ABLATION N/A 02/07/2011   Procedure: ATRIAL FLUTTER ABLATION;  Surgeon: John Lance, MD;  Location: [facility] CATH LAB;  Service: Cardiovascular;  Laterality: N/A;  . CARDIAC ELECTROPHYSIOLOGY STUDY AND ABLATION  02/07/11  . CARDIOVERSION N/A 03/23/2016   Procedure: CARDIOVERSION;  Surgeon: John Lance, MD;  Location: [place];  Service: Cardiovascular;  Laterality: N/A;  . COLONOSCOPY N/A 04/17/2017   Procedure: COLONOSCOPY;  Surgeon: John Houston, MD;  [facility];  Service: Endoscopy;  Laterality: N/A;  830  . EP IMPLANTABLE DEVICE N/A 08/29/2015   Procedure: Pacemaker Implant;  Surgeon: John Lance, MD;  Location: [facility] CV LAB;  Service: Cardiovascular;  Laterality: N/A;  . EP IMPLANTABLE DEVICE N/A 12/07/2015   Procedure: PPM Lead Revision/Repair;  Surgeon: John Lance, MD;  Location: [place] CV LAB;  Service: Cardiovascular;  Laterality: N/A;  . KNEE ARTHROSCOPY  2005   left  . LUMBAR SPINE SURGERY     "I've had 6 ORs 1972 thru 2004"  . POLYPECTOMY  04/17/2017   Procedure: POLYPECTOMY;  Surgeon: John Houston, MD;  [facility];  Service: Endoscopy;;  transverse colon x3;     No Known Allergies    Family History  Problem Relation Age of Onset  . Heart attack Mother   . Hypertension Mother   . Heart attack Father   . Hypertension Father   . Heart attack Brother   . Colon cancer Neg Hx      Social History John [name] reports that he quit smoking about 33 years ago. His smoking use included cigarettes. He has a 10.00 pack-year smoking history. He has never used smokeless tobacco. John [name]  reports no history of alcohol use.   Review of Systems CONSTITUTIONAL: No weight loss, fever, chills, weakness or fatigue.  HEENT: Eyes: No visual loss, blurred vision, double vision or yellow sclerae.No hearing loss, sneezing, congestion, runny nose or sore throat.  SKIN: No rash or itching.  CARDIOVASCULAR: per hpi RESPIRATORY: No shortness of breath, cough or sputum.  GASTROINTESTINAL: No anorexia, nausea, vomiting or diarrhea. No abdominal pain or blood.  GENITOURINARY: No burning on urination, no polyuria NEUROLOGICAL: No headache, dizziness, syncope, paralysis, ataxia, numbness or tingling in the extremities.  No change in bowel or bladder control.  MUSCULOSKELETAL: No muscle, back pain, joint pain or stiffness.  LYMPHATICS: No enlarged nodes. No history of splenectomy.  PSYCHIATRIC: No history of depression or anxiety.  ENDOCRINOLOGIC: No reports of sweating, cold or heat intolerance. No polyuria or polydipsia.  Marland Kitchen   Physical Examination Today's Vitals   09/30/19 0953  BP: 115/78  Pulse: (!) 51  SpO2: 99%  Weight: 249 lb 6.4 oz (113.1 kg)  Height: 6' (1.829 m)   Body mass index is 33.82 kg/m.  Gen: resting comfortably, no acute distress HEENT: no scleral icterus, pupils equal round and reactive, no palptable cervical adenopathy,  CV: RRR, 2/6 systolic murmur rusb. +JVD Resp: mild crackles bilaterally GI: abdomen is soft, non-tender, non-distended, normal bowel sounds, no hepatosplenomegaly MSK: extremities are warm, 2+ bialteral LE edema Skin: warm, no rash Neuro:  no focal deficits Psych: appropriate affect   Diagnostic Studies  Echocardiogram: 07/2018 IMPRESSIONS   1. The left ventricle has mildly reduced systolic function, with an ejection fraction of 45-50%. The cavity size was mild to moderately dilated. There is mildly increased left ventricular wall thickness. Left ventricular diastolic Doppler parameters are consistent with pseudonormalization. Left  ventrical global hypokinesis without regional wall motion abnormalities. 2. The right ventricle has normal systolic function. The cavity was normal. There is no increase in right ventricular wall thickness. 3. Left atrial size was severely dilated. 4. The aortic valve is tricuspid. Mild thickening of the aortic valve. Moderate calcification of the aortic valve. Mild stenosis of the aortic valve. 5. The ascending aorta is normal in size and structure. 6. The inferior vena cava was dilated in size with <50% respiratory variability.   Assessment and Plan  1. Acute on chronic combined systolic/diastolic HF - signs of fluid overload on exam - increase torsemide to 65m bid. Would continue metolazone just sparingly, favor titratring his loop diuretic to control his edema and limit metolazone use - check BMET/Mg  2 weeks  2. Afib -no symptoms, EKG today shows V paced rhythm - continue eliquis  3. Complete heart block - normal device function on last check, due for recheck coming up - continue to monitor  4. Aortic stenosis - mixed parameters based on last years echo, moderate to severe by AVA and DI, mild gradient. Had a low reported stroke volume index. Unclear if he has paradoxical low flow AS as LVEF has not been that bad. Visually/morphologically the AS looks probably moderate.  - repeat echo.    F/u 1 month   JArnoldo Lenis M.D.

## 2019-10-02 ENCOUNTER — Telehealth: Payer: Self-pay | Admitting: Internal Medicine

## 2019-10-02 ENCOUNTER — Other Ambulatory Visit (HOSPITAL_COMMUNITY)
Admission: RE | Admit: 2019-10-02 | Discharge: 2019-10-02 | Disposition: A | Discharge status: Home / Self Care | Payer: Medicare HMO | Source: Ambulatory Visit | Attending: Cardiology | Admitting: Cardiology

## 2019-10-02 DIAGNOSIS — I4819 Other persistent atrial fibrillation: Secondary | ICD-10-CM | POA: Diagnosis not present

## 2019-10-02 LAB — BASIC METABOLIC PANEL
Anion gap: 11 (ref 5–15)
BUN: 43 mg/dL — ABNORMAL HIGH (ref 8–23)
CO2: 32 mmol/L (ref 22–32)
Calcium: 9.4 mg/dL (ref 8.9–10.3)
Chloride: 95 mmol/L — ABNORMAL LOW (ref 98–111)
Creatinine, Ser: 1.55 mg/dL — ABNORMAL HIGH (ref 0.61–1.24)
GFR calc Af Amer: 51 mL/min — ABNORMAL LOW (ref >60)
GFR calc non Af Amer: 44 mL/min — ABNORMAL LOW (ref >60)
Glucose, Bld: 67 mg/dL — ABNORMAL LOW (ref 70–99)
Potassium: 3.6 mmol/L (ref 3.5–5.1)
Sodium: 138 mmol/L (ref 135–145)

## 2019-10-02 LAB — MAGNESIUM: Magnesium: 2.4 mg/dL (ref 1.7–2.4)

## 2019-10-02 NOTE — Telephone Encounter (Signed)
Spoke with pt. He reports clear fluid draining from PPM site since last night after his shower. States the area that is draining is a little red and sore, ~3" in diameter. Denies swelling, fever/chills, or recent trauma to site. He has only noticed clear drainage on his shirt, like it is seeping. Pt agrees to have his grandson send a photo in. Will call back after photo reviewed. Pt in agreement with plan.

## 2019-10-02 NOTE — Telephone Encounter (Signed)
Photo received, see below. Small abrasions noted which appear superficial, as well as area of possible discoloration (vs shadow) at left upper chest near PPM border. Drainage also noted on pt's shirt. Discussed with Tommye Standard, PA-C, who recommended PCP/urgent care assessment, as well as f/u with Dr. Lovena Le next week. Pt in agreement with plan; he accepted appointment with Dr. Lovena Le in [place] on 10/05/19 at 11:00am. ED precautions reviewed for worsening symptoms, including fever/chills over the weekend. Pt verbalizes understanding and denies additional questions at this time. Per review of Paceart notes, pt has historically been PPM dependent.

## 2019-10-02 NOTE — Telephone Encounter (Signed)
Patient called stating that he has a wet spot around his pacemaker.  Please call [phone].

## 2019-10-05 ENCOUNTER — Other Ambulatory Visit: Payer: Self-pay

## 2019-10-05 ENCOUNTER — Encounter: Payer: Self-pay | Admitting: Internal Medicine

## 2019-10-05 ENCOUNTER — Other Ambulatory Visit: Payer: Self-pay | Admitting: *Deleted

## 2019-10-05 ENCOUNTER — Ambulatory Visit (INDEPENDENT_AMBULATORY_CARE_PROVIDER_SITE_OTHER): Payer: Medicare HMO | Admitting: Internal Medicine

## 2019-10-05 VITALS — BP 120/80 | HR 83 | Ht 72.0 in | Wt 247.0 lb

## 2019-10-05 DIAGNOSIS — Z79899 Other long term (current) drug therapy: Secondary | ICD-10-CM | POA: Diagnosis not present

## 2019-10-05 DIAGNOSIS — I5042 Chronic combined systolic (congestive) and diastolic (congestive) heart failure: Secondary | ICD-10-CM

## 2019-10-05 MED ORDER — SPIRONOLACTONE 25 MG PO TABS
25.0000 mg | ORAL_TABLET | Freq: Every day | ORAL | 3 refills | Status: DC
Start: 2019-10-05 — End: 2019-10-05

## 2019-10-05 MED ORDER — BENZONATATE 100 MG PO CAPS: ORAL_CAPSULE | ORAL | 1 refills | Status: DC

## 2019-10-05 MED ORDER — SPIRONOLACTONE 25 MG PO TABS: 25.0000 mg | ORAL_TABLET | Freq: Every day | ORAL | 3 refills | Status: DC

## 2019-10-05 MED ORDER — POTASSIUM CHLORIDE CRYS ER 20 MEQ PO TBCR: EXTENDED_RELEASE_TABLET | ORAL | 6 refills | Status: DC

## 2019-10-05 NOTE — Patient Instructions (Signed)
Medication Instructions:  Your physician has recommended you make the following change in your medication:  Start Aldactone 25 mg Daily  Take Potassium 60 mg Three times Daily    *If you need a refill on your cardiac medications before your next appointment, please call your pharmacy*   Lab Work: Your physician recommends that you return for lab work in: 1 Week   If you have labs (blood work) drawn today and your tests are completely normal, you will receive your results only by: Marland Kitchen MyChart Message (if you have MyChart) OR . A paper copy in the mail If you have any lab test that is abnormal or we need to change your treatment, we will call you to review the results.   Testing/Procedures: Your physician has requested that you have an echocardiogram. Echocardiography is a painless test that uses sound waves to create images of your heart. It provides your doctor with information about the size and shape of your heart and how well your heart's chambers and valves are working. This procedure takes approximately one hour. There are no restrictions for this procedure.     Follow-Up: At [facility], you and your health needs are our priority.  As part of our continuing mission to provide you with exceptional heart care, we have created designated Provider Care Teams.  These Care Teams include your primary Cardiologist (physician) and Advanced Practice Providers (APPs -  Physician Assistants and Nurse Practitioners) who all work together to provide you with the care you need, when you need it.  We recommend signing up for the patient portal called "MyChart".  Sign up information is provided on this After Visit Summary.  MyChart is used to connect with patients for Virtual Visits (Telemedicine).  Patients are able to view lab/test results, encounter notes, upcoming appointments, etc.  Non-urgent messages can be sent to your provider as well.   To learn more about what you can do with MyChart, go  to [email or web address].    Your next appointment:   8 week(s)  The format for your next appointment:   In Person  Provider:   [name], MD   Other Instructions Thank you for choosing [facility]!

## 2019-10-05 NOTE — Addendum Note (Signed)
Addended by: Levonne Hubert on: 10/05/2019 12:21 PM   Modules accepted: Orders

## 2019-10-05 NOTE — Progress Notes (Signed)
HPI John [name] returns today for followup. He has persistent atrial fib, previously diastolic heart failure, CHB, s/p PPM insertion. He appears to be chronically in atrial fib and has worsening PND and orthopnea. No chest pain. He has mild peripheral edema. He struggle to keep down all of his potassium. He has a serous drainage below his PM pocket. No Known Allergies   Current Outpatient Medications  Medication Sig Dispense Refill  . acetaminophen (TYLENOL) 500 MG tablet Take 1,000 mg by mouth every 6 (six) hours as needed for moderate pain (thigh pain).    . benzonatate (TESSALON) 100 MG capsule TAKE 1 CAPSULE THREE TIMES DAILY AS NEEDED FOR COUGH 90 capsule 1  . carvedilol (COREG) 25 MG tablet TAKE 1 TABLET (25 MG TOTAL) BY MOUTH 2 (TWO) TIMES DAILY WITH A MEAL. 180 tablet 3  . ELIQUIS 5 MG TABS tablet TAKE 1 TABLET TWICE DAILY 180 tablet 3  . HYDROcodone-acetaminophen (NORCO/VICODIN) 5-325 MG tablet Take one tablet po every 4 hrs prn pain (Patient taking differently: Take 1 tablet by mouth every 4 (four) hours as needed for moderate pain. ) 28 tablet 0  . hydrocortisone 2.5 % cream Apply BID to affected area 60 g 3  . insulin degludec (TRESIBA FLEXTOUCH) 200 UNIT/ML FlexTouch Pen Inject 60 Units into the skin at bedtime. 10 pen 3  . Insulin Pen Needle (BD PEN NEEDLE NANO U/F) 32G X 4 MM MISC 1 each by Does not apply route 4 (four) times daily. 150 each 5  . Magnesium 400 MG CAPS Take 400 mg by mouth daily.     . metFORMIN (GLUCOPHAGE) 500 MG tablet Take 1 tablet (500 mg total) by mouth 2 (two) times daily with a meal. 180 tablet 1  . metolazone (ZAROXOLYN) 2.5 MG tablet Take 1 tablet (2.5 mg total) by mouth 2 (two) times a week. Monday and Friday 15 tablet 1  . Multiple Vitamins-Minerals (MULTIVITAMIN WITH MINERALS) tablet Take 1 tablet by mouth daily.    . potassium chloride SA (KLOR-CON) 20 MEQ tablet Take 60 meq in the am, at lunch, and in the evening 270 tablet 6  . Resveratrol  100 MG CAPS Take 100 mg by mouth at bedtime.     . rosuvastatin (CRESTOR) 20 MG tablet Take 1 tablet (20 mg total) by mouth daily. 90 tablet 1  . sildenafil (VIAGRA) 50 MG tablet Take 1 tablet (50 mg total) by mouth daily as needed for erectile dysfunction. 30 tablet 3  . torsemide (DEMADEX) 20 MG tablet Take 2 tablets (40 mg total) by mouth 2 (two) times daily. 120 tablet 3  . TRULICITY 1.5 VX/4.8AX SOPN INJECT 1.5MG (1 PEN) SUBCUTANEOUSLY EVERY WEEK 2 mL 1   No current facility-administered medications for this visit.     Past Medical History:  Diagnosis Date  . Atrial flutter ([place]) 12/2010   Admitted with symptomatic bradycardia (HR 40s), atrial flutter with slow ventricular response 12/2010 + volume overload; AV nodal agents d/c'd and Pradaxa started; RFA in 01/2011  . CHF (congestive heart failure) ([place])   . Chronic combined systolic and diastolic heart failure ([place])    a. echo 01/06/11: mild LVH, EF 65-70%, mod to severe LAE, mild RVE, mild RAE, PASP 32;   TEE 10/12: EF 45-50% b. EF 45 to 50% by echo in 07/2018  . Class 2 severe obesity due to excess calories with serious comorbidity and body mass index (BMI) of 37.0 to 37.9 in adult ([place]) 07/17/2017  .  Diabetes mellitus    non insulin dependant  . Hyperlipidemia   . Hypertension   . Osteoarthritis   . Presence of permanent cardiac pacemaker   . PSVT (paroxysmal supraventricular tachycardia) (HCC)    Possibly atrial flutter    ROS:   All systems reviewed and negative except as noted in the HPI.   Past Surgical History:  Procedure Laterality Date  . ATRIAL FLUTTER ABLATION N/A 02/07/2011   Procedure: ATRIAL FLUTTER ABLATION;  Surgeon: Evans Lance, MD;  Location: [facility] CATH LAB;  Service: Cardiovascular;  Laterality: N/A;  . CARDIAC ELECTROPHYSIOLOGY STUDY AND ABLATION  02/07/11  . CARDIOVERSION N/A 03/23/2016   Procedure: CARDIOVERSION;  Surgeon: Evans Lance, MD;  Location: [facility];  Service: Cardiovascular;   Laterality: N/A;  . COLONOSCOPY N/A 04/17/2017   Procedure: COLONOSCOPY;  Surgeon: Rogene Houston, MD;  [facility];  Service: Endoscopy;  Laterality: N/A;  830  . EP IMPLANTABLE DEVICE N/A 08/29/2015   Procedure: Pacemaker Implant;  Surgeon: Evans Lance, MD;  Location: [place] CV LAB;  Service: Cardiovascular;  Laterality: N/A;  . EP IMPLANTABLE DEVICE N/A 12/07/2015   Procedure: PPM Lead Revision/Repair;  Surgeon: Evans Lance, MD;  Location: [place] CV LAB;  Service: Cardiovascular;  Laterality: N/A;  . KNEE ARTHROSCOPY  2005   left  . LUMBAR SPINE SURGERY     "I've had 6 ORs 1972 thru 2004"  . POLYPECTOMY  04/17/2017   Procedure: POLYPECTOMY;  Surgeon: Rogene Houston, MD;  [facility];  Service: Endoscopy;;  transverse colon x3;     Family History  Problem Relation Age of Onset  . Heart attack Mother   . Hypertension Mother   . Heart attack Father   . Hypertension Father   . Heart attack Brother   . Colon cancer Neg Hx      Social History   Socioeconomic History  . Marital status: Married    Spouse name: Not on file  . Number of children: Not on file  . Years of education: Not on file  . Highest education level: Not on file  Occupational History  . Occupation: Immunologist: RETIRED  Tobacco Use  . Smoking status: Former Smoker    Packs/day: 1.00    Years: 10.00    Pack years: 10.00    Types: Cigarettes    Quit date: 03/26/1986    Years since quitting: 33.5  . Smokeless tobacco: Never Used  . Tobacco comment: "stopped cigarette  smoking 1988"  Vaping Use  . Vaping Use: Never used  Substance and Sexual Activity  . Alcohol use: Never    Alcohol/week: 0.0 standard drinks    Comment: "quit alcohol ~ 2007"  . Drug use: No  . Sexual activity: Yes    Partners: Female  Other Topics Concern  . Not on file  Social History Narrative  . Not on file   Social Determinants of Health   Financial Resource Strain:  Low Risk   . Difficulty of Paying Living Expenses: Not hard at all  Food Insecurity: No Food Insecurity  . Worried About Charity fundraiser in the Last Year: Never true  . Ran Out of Food in the Last Year: Never true  Transportation Needs: No Transportation Needs  . Lack of Transportation (Medical): No  . Lack of Transportation (Non-Medical): No  Physical Activity: Insufficiently Active  . Days of Exercise per Week: 3 days  . Minutes  of Exercise per Session: 20 min  Stress: No Stress Concern Present  . Feeling of Stress : Not at all  Social Connections: Socially Integrated  . Frequency of Communication with Friends and Family: More than three times a week  . Frequency of Social Gatherings with Friends and Family: More than three times a week  . Attends Religious Services: More than 4 times per year  . Active Member of Clubs or Organizations: Yes  . Attends Archivist Meetings: More than 4 times per year  . Marital Status: Married  Human resources officer Violence: Not At Risk  . Fear of Current or Ex-Partner: No  . Emotionally Abused: No  . Physically Abused: No  . Sexually Abused: No     BP 120/80   Pulse 83   Ht 6' (1.829 m)   Wt 247 lb (112 kg)   SpO2 98%   BMI 33.50 kg/m   Physical Exam:  Well appearing NAD HEENT: Unremarkable Neck:  No JVD, no thyromegally Lymphatics:  No adenopathy Back:  No CVA tenderness Lungs:  Clear with minimal basilar wheezes HEART:  Regular rate rhythm, no murmurs, no rubs, no clicks Abd:  soft, positive bowel sounds, no organomegally, no rebound, no guarding Ext:  2 plus pulses, 1+ edema, no cyanosis, no clubbing Skin:  No rashes no nodules Neuro:  CN II through XII intact, motor grossly intact  EKG - reviewed. Atrial fib with ventricular pacing  DEVICE  Normal device function.  See PaceArt for details.   Assess/Plan: 1. Atrial fib - his VR is controlled. He will continue his current meds. I doubt we can restore NSR. 2.  Dyspnea - this appears worse. He has PND. I will add aldactone. 3. Hypokalemia - we will add aldactone and reduce his dose of potassium though he may need this uptitrated. 4. PPM - his medtronic DDD PM is working normally. 5. Coags - he has had no bleeding on the eliquis.   Mikle [email or web address].D.

## 2019-10-06 LAB — CUP PACEART INCLINIC DEVICE CHECK
Battery Impedance: 255 Ohm
Battery Remaining Longevity: 95 mo
Battery Voltage: 2.79 V
Brady Statistic AP VP Percent: 15 %
Brady Statistic AP VS Percent: 0 %
Brady Statistic AS VP Percent: 27 %
Brady Statistic AS VS Percent: 59 %
Date Time Interrogation Session: 20210712110200
Implantable Lead Implant Date: 20170605
Implantable Lead Implant Date: 20170605
Implantable Lead Location: 753859
Implantable Lead Location: 753860
Implantable Lead Model: 5076
Implantable Lead Model: 5076
Implantable Pulse Generator Implant Date: 20170605
Lead Channel Impedance Value: 401 Ohm
Lead Channel Impedance Value: 524 Ohm
Lead Channel Pacing Threshold Amplitude: 0.75 V
Lead Channel Pacing Threshold Pulse Width: 0.4 ms
Lead Channel Sensing Intrinsic Amplitude: 1 mV
Lead Channel Setting Pacing Amplitude: 2 V
Lead Channel Setting Pacing Amplitude: 2.5 V
Lead Channel Setting Pacing Pulse Width: 0.4 ms
Lead Channel Setting Sensing Sensitivity: 4 mV

## 2019-10-07 ENCOUNTER — Telehealth: Payer: Self-pay | Admitting: Internal Medicine

## 2019-10-07 ENCOUNTER — Telehealth: Payer: Self-pay

## 2019-10-07 NOTE — Telephone Encounter (Signed)
Roselind Rily would like to update Dr.Taylor   Please call [place] [phone]   Thanks renee

## 2019-10-07 NOTE — Telephone Encounter (Signed)
Larena Glassman from [facility] patient was seen today and she reports that he had increased weight gain from 236 a month ago  to 246 lbs today. Patient did not take his metolazone Monday due to his appointment with Dr Lovena Le. He reported today that he has increased SOB at night. He has increased abd girth, no peda; edema. Larena Glassman reports lung sounds were clear. The NP for the agency ordered patient to take metolazone today and Friday. Patient has echo ordered 10/14/19 and a f/u appointment in 8 weeks with Dr Lovena Le. Home health will be following the patient more closely over the next few weeks and will notify office of any changes in patient condition.

## 2019-10-12 ENCOUNTER — Other Ambulatory Visit (HOSPITAL_COMMUNITY)
Admission: RE | Admit: 2019-10-12 | Discharge: 2019-10-12 | Disposition: A | Discharge status: Home / Self Care | Payer: Medicare HMO | Source: Ambulatory Visit | Attending: Internal Medicine | Admitting: Internal Medicine

## 2019-10-12 ENCOUNTER — Telehealth: Payer: Self-pay | Admitting: *Deleted

## 2019-10-12 ENCOUNTER — Other Ambulatory Visit: Payer: Self-pay

## 2019-10-12 DIAGNOSIS — Z79899 Other long term (current) drug therapy: Secondary | ICD-10-CM | POA: Insufficient documentation

## 2019-10-12 LAB — BASIC METABOLIC PANEL
Anion gap: 12 (ref 5–15)
BUN: 45 mg/dL — ABNORMAL HIGH (ref 8–23)
CO2: 29 mmol/L (ref 22–32)
Calcium: 8.9 mg/dL (ref 8.9–10.3)
Chloride: 95 mmol/L — ABNORMAL LOW (ref 98–111)
Creatinine, Ser: 1.8 mg/dL — ABNORMAL HIGH (ref 0.61–1.24)
GFR calc Af Amer: 42 mL/min — ABNORMAL LOW (ref >60)
GFR calc non Af Amer: 37 mL/min — ABNORMAL LOW (ref >60)
Glucose, Bld: 117 mg/dL — ABNORMAL HIGH (ref 70–99)
Potassium: 4 mmol/L (ref 3.5–5.1)
Sodium: 136 mmol/L (ref 135–145)

## 2019-10-12 NOTE — Telephone Encounter (Signed)
-----   Message from Arnoldo Lenis, MD sent at 10/09/2019  2:33 PM EDT ----- Labs show mild stress on the kidneys likely from the fluid pills. DUe to his recent fluid issues would continue current meds, f/u his upcoming echo   Zandra Abts MD

## 2019-10-12 NOTE — Telephone Encounter (Signed)
Pt voiced understanding - echo scheduled 7/21 - routed to pcp

## 2019-10-13 ENCOUNTER — Other Ambulatory Visit (HOSPITAL_COMMUNITY): Payer: Medicare HMO

## 2019-10-14 ENCOUNTER — Encounter: Payer: Self-pay | Admitting: Family Medicine

## 2019-10-14 ENCOUNTER — Ambulatory Visit (INDEPENDENT_AMBULATORY_CARE_PROVIDER_SITE_OTHER): Payer: Medicare HMO

## 2019-10-14 ENCOUNTER — Other Ambulatory Visit: Payer: Self-pay

## 2019-10-14 ENCOUNTER — Ambulatory Visit (INDEPENDENT_AMBULATORY_CARE_PROVIDER_SITE_OTHER): Payer: Medicare HMO | Admitting: Family Medicine

## 2019-10-14 VITALS — BP 122/70 | HR 65 | Temp 97.4°F | Ht 72.0 in | Wt 242.2 lb

## 2019-10-14 DIAGNOSIS — R600 Localized edema: Secondary | ICD-10-CM

## 2019-10-14 DIAGNOSIS — N183 Chronic kidney disease, stage 3 unspecified: Secondary | ICD-10-CM | POA: Diagnosis not present

## 2019-10-14 DIAGNOSIS — E1159 Type 2 diabetes mellitus with other circulatory complications: Secondary | ICD-10-CM

## 2019-10-14 DIAGNOSIS — I5032 Chronic diastolic (congestive) heart failure: Secondary | ICD-10-CM | POA: Diagnosis not present

## 2019-10-14 DIAGNOSIS — I350 Nonrheumatic aortic (valve) stenosis: Secondary | ICD-10-CM

## 2019-10-14 LAB — ECHOCARDIOGRAM COMPLETE
AR max vel: 1.02 cm2
AV Area VTI: 0.86 cm2
AV Area mean vel: 0.94 cm2
AV Mean grad: 15.5 mmHg
AV Peak grad: 26 mmHg
Ao pk vel: 2.55 m/s
Area-P 1/2: 3.6 cm2
Calc EF: 46.3 %
Height: 72 in
MV M vel: 4.51 m/s
MV Peak grad: 81.5 mmHg
S' Lateral: 4.08 cm
Single Plane A2C EF: 47.1 %
Single Plane A4C EF: 45.2 %
Weight: 3875.2 oz

## 2019-10-14 NOTE — Progress Notes (Signed)
Patient ID: LYRIC HOAR, male    DOB: 1946/01/08, 74 y.o.   MRN: [id number]   Chief Complaint  Patient presents with   Hypertension    follow up   Subjective:    HPI  F/i DM2, HTN, HLD, CKD.  Patient sees endocrinology for diabetes  Patient states he has appt this pm with cardiology in [facility] for work up on pedal edema. Patient also has upcoming appt with GI doctor to evaluate elevated liver enzymes.  Overall doing well, no new concerns with meds.  Lower leg and pedal edema going on for 1 month, has been seeing cardiology.  They changed fluid pills several times. And has helped lose about 15 lbs.   CKD--Has elevated Cr at 1.8, due to the increase in fluid pills.   Follow up- Dm2 Meds- stable BG ranges-  Am seeing 50-70. Side effects- Denies polyuria, polyphagia, or polydipsia. Denies any sores or ulcers on feet. Last eye exam: due, last one 2/20. Has appt coming with endo about medications. Seeing Dr. Radford Pax for optho, but they retired.  Pt will call for appt.   Lab Results  Component Value Date   HGBA1C 7.1 (A) 08/26/2019   HGBA1C 7.5 (A) 04/27/2019   HGBA1C 8.3 (H) 12/15/2018   Lab Results  Component Value Date   LDLCALC 107 (H) 08/12/2019   CREATININE 1.80 (H) 10/12/2019   Medical History Kasir has a past medical history of Atrial flutter ([place]) (12/2010), CHF (congestive heart failure) ([place]), Chronic combined systolic and diastolic heart failure ([place]), Class 2 severe obesity due to excess calories with serious comorbidity and body mass index (BMI) of 37.0 to 37.9 in adult [facility]) (07/17/2017), Diabetes mellitus, Hyperlipidemia, Hypertension, Osteoarthritis, Presence of permanent cardiac pacemaker, and PSVT (paroxysmal supraventricular tachycardia) ([place]).   Outpatient Encounter Medications as of 10/14/2019  Medication Sig   ACCU-CHEK AVIVA PLUS test strip TEST BLOOD SUGAR TWICE DAILY BEFORE BREAKFAST AND AT BEDTIME   acetaminophen (TYLENOL) 500 MG tablet  Take 1,000 mg by mouth every 6 (six) hours as needed for moderate pain (thigh pain).   benzonatate (TESSALON) 100 MG capsule TAKE 1 CAPSULE THREE TIMES DAILY AS NEEDED FOR COUGH (Patient not taking: Reported on 10/14/2019)   carvedilol (COREG) 25 MG tablet TAKE 1 TABLET (25 MG TOTAL) BY MOUTH 2 (TWO) TIMES DAILY WITH A MEAL.   ELIQUIS 5 MG TABS tablet TAKE 1 TABLET TWICE DAILY   HYDROcodone-acetaminophen (NORCO/VICODIN) 5-325 MG tablet Take one tablet po every 4 hrs prn pain (Patient taking differently: Take 1 tablet by mouth every 4 (four) hours as needed for moderate pain. )   hydrocortisone 2.5 % cream Apply BID to affected area   insulin degludec (TRESIBA FLEXTOUCH) 200 UNIT/ML FlexTouch Pen Inject 60 Units into the skin at bedtime.   Insulin Pen Needle (BD PEN NEEDLE NANO U/F) 32G X 4 MM MISC 1 each by Does not apply route 4 (four) times daily.   Magnesium 400 MG CAPS Take 400 mg by mouth daily.    metFORMIN (GLUCOPHAGE) 500 MG tablet TAKE 1 TABLET (500 MG TOTAL) BY MOUTH 2 (TWO) TIMES DAILY WITH A MEAL.   metolazone (ZAROXOLYN) 2.5 MG tablet Take 1 tablet (2.5 mg total) by mouth 2 (two) times a week. Monday and Friday   Multiple Vitamins-Minerals (MULTIVITAMIN WITH MINERALS) tablet Take 1 tablet by mouth daily.   potassium chloride SA (KLOR-CON) 20 MEQ tablet Take 60 meq in the am, at lunch, and in the evening   Resveratrol 100  MG CAPS Take 100 mg by mouth at bedtime.    rosuvastatin (CRESTOR) 20 MG tablet Take 1 tablet (20 mg total) by mouth daily.   sildenafil (VIAGRA) 50 MG tablet Take 1 tablet (50 mg total) by mouth daily as needed for erectile dysfunction.   spironolactone (ALDACTONE) 25 MG tablet Take 1 tablet (25 mg total) by mouth daily.   torsemide (DEMADEX) 20 MG tablet Take 2 tablets (40 mg total) by mouth 2 (two) times daily.   TRULICITY 1.5 ZM/6.2HU SOPN INJECT 1.5MG (1 PEN) SUBCUTANEOUSLY EVERY WEEK   No facility-administered encounter medications on file as  of 10/14/2019.     Review of Systems  Constitutional: Negative for chills and fever.  HENT: Negative for congestion, rhinorrhea and sore throat.   Respiratory: Negative for cough, shortness of breath and wheezing.   Cardiovascular: Positive for leg swelling (bilateral, improving). Negative for chest pain.  Gastrointestinal: Negative for abdominal pain, diarrhea, nausea and vomiting.  Genitourinary: Negative for dysuria and frequency.  Skin: Negative for rash.  Neurological: Negative for dizziness, weakness and headaches.     Vitals BP 122/70    Pulse 65    Temp (!) 97.4 F (36.3 C) (Oral)    Ht 6' (1.829 m)    Wt 242 lb 3.2 oz (109.9 kg)    SpO2 100%    BMI 32.85 kg/m   Objective:   Physical Exam Vitals and nursing note reviewed.  Constitutional:      General: He is not in acute distress.    Appearance: Normal appearance. He is not ill-appearing.  HENT:     Head: Normocephalic.     Nose: Nose normal. No congestion.     Mouth/Throat:     Mouth: Mucous membranes are moist.     Pharynx: No oropharyngeal exudate.  Eyes:     Extraocular Movements: Extraocular movements intact.     Conjunctiva/sclera: Conjunctivae normal.     Pupils: Pupils are equal, round, and reactive to light.  Cardiovascular:     Rate and Rhythm: Normal rate and regular rhythm.     Pulses: Normal pulses.     Heart sounds: Murmur (systolic) heard.   Pulmonary:     Effort: Pulmonary effort is normal.     Breath sounds: Normal breath sounds. No wheezing, rhonchi or rales.  Musculoskeletal:        General: Normal range of motion.     Right lower leg: Edema present.     Left lower leg: Edema present.     Comments: +2 pitting edema up to mid shin   Skin:    General: Skin is warm and dry.     Findings: No erythema or rash.  Neurological:     General: No focal deficit present.     Mental Status: He is alert and oriented to person, place, and time.     Cranial Nerves: No cranial nerve deficit.    Psychiatric:        Mood and Affect: Mood normal.        Behavior: Behavior normal.        Thought Content: Thought content normal.        Judgment: Judgment normal.      Assessment and Plan   1. DM type 2 causing vascular disease (HCC) - Urine Microalbumin w/creat. ratio  2. Chronic diastolic CHF (congestive heart failure) (HCC)  3. Stage 3 chronic kidney disease, unspecified whether stage 3a or 3b CKD  4. Lower leg edema   Dm2-  stable, cont f/u with endocrinology.  If seeing lows then needing to call endo to get medication adjusted.   Pt to call to get eye exam.  HTN, CHF, and lower leg edema- stable, cont meds. F/u cards.  CKD3- cont f/u with cards for lower leg edema and CHF and will cont to monitor Cr. Has elevation of Cr to 1.8 after on diuretics recently.  Lower leg edmea- stable, improving.  Cont f/u with cards and medications as directed.  F/u [address] prn.

## 2019-10-14 NOTE — Patient Instructions (Signed)
Call about set up appt for diabetic eye exam.

## 2019-10-15 ENCOUNTER — Ambulatory Visit (INDEPENDENT_AMBULATORY_CARE_PROVIDER_SITE_OTHER): Payer: Medicare HMO | Admitting: *Deleted

## 2019-10-15 DIAGNOSIS — I442 Atrioventricular block, complete: Secondary | ICD-10-CM | POA: Diagnosis not present

## 2019-10-16 LAB — CUP PACEART REMOTE DEVICE CHECK
Battery Impedance: 256 Ohm
Battery Remaining Longevity: 96 mo
Battery Voltage: 2.78 V
Brady Statistic AP VP Percent: 16 %
Brady Statistic AP VS Percent: 0 %
Brady Statistic AS VP Percent: 27 %
Brady Statistic AS VS Percent: 56 %
Date Time Interrogation Session: 20210722070349
Implantable Lead Implant Date: 20170605
Implantable Lead Implant Date: 20170605
Implantable Lead Location: 753859
Implantable Lead Location: 753860
Implantable Lead Model: 5076
Implantable Lead Model: 5076
Implantable Pulse Generator Implant Date: 20170605
Lead Channel Impedance Value: 407 Ohm
Lead Channel Impedance Value: 539 Ohm
Lead Channel Pacing Threshold Amplitude: 0.75 V
Lead Channel Pacing Threshold Amplitude: 0.875 V
Lead Channel Pacing Threshold Pulse Width: 0.4 ms
Lead Channel Pacing Threshold Pulse Width: 0.4 ms
Lead Channel Setting Pacing Amplitude: 2 V
Lead Channel Setting Pacing Amplitude: 2.5 V
Lead Channel Setting Pacing Pulse Width: 0.4 ms
Lead Channel Setting Sensing Sensitivity: 4 mV

## 2019-10-19 DIAGNOSIS — E1159 Type 2 diabetes mellitus with other circulatory complications: Secondary | ICD-10-CM | POA: Diagnosis not present

## 2019-10-19 NOTE — Progress Notes (Signed)
Remote pacemaker transmission.   

## 2019-10-20 LAB — MICROALBUMIN / CREATININE URINE RATIO
Creatinine, Urine: 94.3 mg/dL
Microalb/Creat Ratio: 18 mg/g creat (ref 0–29)
Microalbumin, Urine: 17 ug/mL

## 2019-10-23 DIAGNOSIS — I517 Cardiomegaly: Secondary | ICD-10-CM | POA: Diagnosis not present

## 2019-10-26 ENCOUNTER — Other Ambulatory Visit: Payer: Self-pay | Admitting: "Endocrinology

## 2019-10-27 ENCOUNTER — Other Ambulatory Visit: Payer: Self-pay | Admitting: "Endocrinology

## 2019-10-28 NOTE — Progress Notes (Signed)
Cardiology Office Note  Date: 10/29/2019   ID: John [name], DOB 11-15-1945, MRN [id number]  PCP:  Erven Colla, DO  Cardiologist:  [name], MD Electrophysiologist:  None   Chief Complaint: Follow-up acute on chronic combined systolic and diastolic heart failure  History of Present Illness: John [name] is a 74 y.o. male with a history of acute on chronic combined systolic and diastolic heart failure, atrial flutter, PAF, CHB, HTN, aortic stenosis, abnormal liver test.  Echo 08/13/2018: EF 45 to 50%, G2 DD, normal RV, mild aortic stenosis.  Some recent edema and SOB/DOE, mild orthopnea.  Weight around 243 pounds.  Taking torsemide 40 mg a.m., 20 p.m., metolazone as needed approximately once a month.  Atrial flutter status post ablation 2012.  No recent palpitations.  PAF on Eliquis for stroke prevention.  Status post pacemaker placement 2017 for complete heart block.  Normal device check January 2021.  Compliant with hypertension medications.  Last visit with Dr. Harl Bowie on 09/30/2019.  He had signs of fluid overload on exam.  Torsemide was increased to 40 mg p.o. twice daily.  Advised to continue metolazone sparingly.  He favored titrating his loop diuretic to control his edema and limit the metolazone use.  He had no symptoms with atrial fibrillation EKG showed V paced rhythm.  He was a continuing Eliquis.  Patient had normal device function on last pacemaker check.  Continuing to monitor.  Aortic stenosis showed mixed parameters based on the previous years echo.  Moderate to severe by AVA and DI, mild gradient, reported stroke volume index low.  Unclear if paradoxical low flow aortic stenosis as LVEF had not been bad.  Visually  aortic stenosis looks probably moderate per Dr. Harl Bowie.  Repeat echo was ordered.   Patient is here today for follow-up status post recent echocardiogram.  He states he is feeling much better since he has lost the fluid.  He is taking torsemide 40 mg  p.o. twice daily and metolazone 2.5 mg p.o. 3 times a week.  He has lost approximately 14 pounds from weight October 14, 2019.  At that time he weighed 242 pounds.  Current weight today is 228 pounds.  He states his breathing is much better and his swelling in his lower extremities is better.  Denies any significant anginal or exertional symptoms, palpitations or arrhythmias.  Denies any PND, orthopnea, CVA or TIA-like symptoms, orthostatic symptoms.  His blood pressure is 100/68 today.  Has some minor bilateral lower extremity edema.   Past Medical History:  Diagnosis Date  . Atrial flutter ([place]) 12/2010   Admitted with symptomatic bradycardia (HR 40s), atrial flutter with slow ventricular response 12/2010 + volume overload; AV nodal agents d/c'd and Pradaxa started; RFA in 01/2011  . CHF (congestive heart failure) ([place])   . Chronic combined systolic and diastolic heart failure ([place])    a. echo 01/06/11: mild LVH, EF 65-70%, mod to severe LAE, mild RVE, mild RAE, PASP 32;   TEE 10/12: EF 45-50% b. EF 45 to 50% by echo in 07/2018  . Class 2 severe obesity due to excess calories with serious comorbidity and body mass index (BMI) of 37.0 to 37.9 in adult ([place]) 07/17/2017  . Diabetes mellitus    non insulin dependant  . Hyperlipidemia   . Hypertension   . Osteoarthritis   . Presence of permanent cardiac pacemaker   . PSVT (paroxysmal supraventricular tachycardia) (HCC)    Possibly atrial flutter    Past Surgical  History:  Procedure Laterality Date  . ATRIAL FLUTTER ABLATION N/A 02/07/2011   Procedure: ATRIAL FLUTTER ABLATION;  Surgeon: Evans Lance, MD;  Location: [facility] CATH LAB;  Service: Cardiovascular;  Laterality: N/A;  . CARDIAC ELECTROPHYSIOLOGY STUDY AND ABLATION  02/07/11  . CARDIOVERSION N/A 03/23/2016   Procedure: CARDIOVERSION;  Surgeon: Evans Lance, MD;  Location: [place];  Service: Cardiovascular;  Laterality: N/A;  . COLONOSCOPY N/A 04/17/2017   Procedure: COLONOSCOPY;   Surgeon: Rogene Houston, MD;  [facility] ENDO SU[facility];  Service: Endoscopy;  Laterality: N/A;  830  . EP IMPLANTABLE DEVICE N/A 08/29/2015   Procedure: Pacemaker Implant;  Surgeon: Evans Lance, MD;  Location: [place] CV LAB;  Service: Cardiovascular;  Laterality: N/A;  . EP IMPLANTABLE DEVICE N/A 12/07/2015   Procedure: PPM Lead Revision/Repair;  Surgeon: Evans Lance, MD;  Location: [place] CV LAB;  Service: Cardiovascular;  Laterality: N/A;  . KNEE ARTHROSCOPY  2005   left  . LUMBAR SPINE SURGERY     "I've had 6 ORs 1972 thru 2004"  . POLYPECTOMY  04/17/2017   Procedure: POLYPECTOMY;  Surgeon: Rogene Houston, MD;  Location: [facility] ENDO SUITE;  Service: Endoscopy;;  transverse colon x3;    Current Outpatient Medications  Medication Sig Dispense Refill  . ACCU-CHEK AVIVA PLUS test strip TEST BLOOD SUGAR TWICE DAILY BEFORE BREAKFAST AND AT BEDTIME 200 strip 1  . acetaminophen (TYLENOL) 500 MG tablet Take 1,000 mg by mouth every 6 (six) hours as needed for moderate pain (thigh pain).    . benzonatate (TESSALON) 100 MG capsule TAKE 1 CAPSULE THREE TIMES DAILY AS NEEDED FOR COUGH 90 capsule 1  . carvedilol (COREG) 25 MG tablet TAKE 1 TABLET (25 MG TOTAL) BY MOUTH 2 (TWO) TIMES DAILY WITH A MEAL. 180 tablet 3  . ELIQUIS 5 MG TABS tablet TAKE 1 TABLET TWICE DAILY 180 tablet 3  . HYDROcodone-acetaminophen (NORCO/VICODIN) 5-325 MG tablet Take one tablet po every 4 hrs prn pain (Patient taking differently: Take 1 tablet by mouth every 4 (four) hours as needed for moderate pain. ) 28 tablet 0  . hydrocortisone 2.5 % cream Apply BID to affected area 60 g 3  . insulin degludec (TRESIBA FLEXTOUCH) 200 UNIT/ML FlexTouch Pen Inject 60 Units into the skin at bedtime. 30 mL 1  . Insulin Pen Needle (BD PEN NEEDLE NANO U/F) 32G X 4 MM MISC 1 each by Does not apply route 4 (four) times daily. 150 each 5  . Magnesium 400 MG CAPS Take 400 mg by mouth daily.     . metFORMIN (GLUCOPHAGE) 500 MG tablet TAKE  1 TABLET (500 MG TOTAL) BY MOUTH 2 (TWO) TIMES DAILY WITH A MEAL. 180 tablet 1  . metolazone (ZAROXOLYN) 2.5 MG tablet Take 1 tablet (2.5 mg total) by mouth 2 (two) times a week. Monday and Friday 15 tablet 1  . Multiple Vitamins-Minerals (MULTIVITAMIN WITH MINERALS) tablet Take 1 tablet by mouth daily.    . potassium chloride SA (KLOR-CON) 20 MEQ tablet Take 60 meq in the am, at lunch, and in the evening 270 tablet 6  . Resveratrol 100 MG CAPS Take 100 mg by mouth at bedtime.     . rosuvastatin (CRESTOR) 20 MG tablet Take 1 tablet (20 mg total) by mouth daily. 90 tablet 1  . sildenafil (VIAGRA) 50 MG tablet Take 1 tablet (50 mg total) by mouth daily as needed for erectile dysfunction. 30 tablet 3  . spironolactone (ALDACTONE) 25 MG  tablet Take 1 tablet (25 mg total) by mouth daily. 30 tablet 3  . torsemide (DEMADEX) 20 MG tablet Take 2 tablets (40 mg total) by mouth 2 (two) times daily. 120 tablet 3  . TRULICITY 1.5 WN/4.6EV SOPN INJECT 1.5MG (1 PEN) SUBCUTANEOUSLY EVERY WEEK 6 mL 0   No current facility-administered medications for this visit.   Allergies:  Patient has no known allergies.   Social History: The patient  reports that he quit smoking about 33 years ago. His smoking use included cigarettes. He has a 10.00 pack-year smoking history. He has never used smokeless tobacco. He reports that he does not drink alcohol and does not use drugs.   Family History: The patient's family history includes Heart attack in his brother, father, and mother; Hypertension in his father and mother.   ROS:  Please see the history of present illness. Otherwise, complete review of systems is positive for none.  All other systems are reviewed and negative.   Physical Exam: VS:  BP 100/68   Pulse 79   Ht 6' (1.829 m)   Wt 228 lb 3.2 oz (103.5 kg)   SpO2 97%   BMI 30.95 kg/m , BMI Body mass index is 30.95 kg/m.  Wt Readings from Last 3 Encounters:  10/29/19 228 lb 3.2 oz (103.5 kg)  10/14/19 242 lb  3.2 oz (109.9 kg)  10/05/19 247 lb (112 kg)    General: Patient appears comfortable at rest. Neck: Supple, no elevated JVP or carotid bruits, no thyromegaly. Lungs: Clear to auscultation, nonlabored breathing at rest. Cardiac: Regular rate and rhythm, no S3 or significant systolic murmur, no pericardial rub. Extremities: Mild non-pitting edema, distal pulses 2+. Skin: Warm and dry. Musculoskeletal: No kyphosis. Neuropsychiatric: Alert and oriented x3, affect grossly appropriate.  ECG:  EKG September 30, 2019 electronic ventricular pacemaker rate of 80.  Recent Labwork: 11/11/2018: B Natriuretic Peptide 1,718.0 08/12/2019: ALT 28; AST 45; Hemoglobin 15.0; Platelets 93 10/02/2019: Magnesium 2.4 10/12/2019: BUN 45; Creatinine, Ser 1.80; Potassium 4.0; Sodium 136     Component Value Date/Time   CHOL 178 08/12/2019 0809   CHOL 142 01/17/2018 0835   TRIG 100 08/12/2019 0809   HDL 51 08/12/2019 0809   HDL 53 01/17/2018 0835   CHOLHDL 3.5 08/12/2019 0809   VLDL 20 08/12/2019 0809   LDLCALC 107 (H) 08/12/2019 0809   LDLCALC 71 01/17/2018 0835    Other Studies Reviewed Today:  Echocardiogram 10/14/2019 1. Left ventricular ejection fraction, by estimation, is 30 to 35%. The left ventricle has moderately decreased function. The left ventricle demonstrates global hypokinesis with abnormal septal motion possibly consistent with pacing. The left ventricular internal cavity size was mildly dilated. Left ventricular diastolic parameters are indeterminate. 2. Right ventricular systolic function is mildly reduced. The right ventricular size is normal. There is mildly elevated pulmonary artery systolic pressure. The estimated right ventricular systolic pressure is 03.5 mmHg. 3. Left atrial size was moderately dilated. 4. Right atrial size was moderately dilated. 5. The mitral valve is abnormal, mildly thickened and calcified. Moderate to severe mitral valve regurgitation, eccentric and posteriorly  directed. Not clear that PISA calculations best define degree of regurgitation. 6. The aortic valve has an indeterminant number of cusps and is severely calcified. Aortic valve mean gradient measures 15.5 mmHg. Cannot exclude severe low gradient aortic stenosis with dimentionless index of 0.23. 7. The inferior vena cava is dilated in size with <50% respiratory variability, suggesting right atrial pressure of 15 mmHg.    Echocardiogram: 07/2018  IMPRESSIONS 1. The left ventricle has mildly reduced systolic function, with an ejection fraction of 45-50%. The cavity size was mild to moderately dilated. There is mildly increased left ventricular wall thickness. Left ventricular diastolic Doppler parameters are consistent with pseudonormalization. Left ventrical global hypokinesis without regional wall motion abnormalities. 2. The right ventricle has normal systolic function. The cavity was normal. There is no increase in right ventricular wall thickness. 3. Left atrial size was severely dilated. 4. The aortic valve is tricuspid. Mild thickening of the aortic valve. Moderate calcification of the aortic valve. Mild stenosis of the aortic valve. 5. The ascending aorta is normal in size and structure. 6. The inferior vena cava was dilated in size with <50% respiratory variability.   Assessment and Plan:  1. Acute on chronic diastolic heart failure ([place])   2. Atrial fibrillation, unspecified type ([place])   3. Complete heart block ([place])   4. Aortic valve stenosis, etiology of cardiac valve disease unspecified    1. Acute on chronic diastolic heart failure ([place]) Patient has lost a significant amount of weight around 14 pounds since last weight on October 14, 2019 until today.  States he is breathing much better.  His swelling is much better.  Plans are to schedule a TEE to better evaluate valvular function.  Then a subsequent catheterization to check for ischemic heart disease contributing to  low EF.  Continue carvedilol 25 mg p.o. twice daily.  Torsemide 40 mg p.o. twice daily, spironolactone 20 mg daily, metolazone 2.5 mg p.o. twice weekly.   2. Atrial fibrillation, unspecified type [facility]) Patient has a history of permanent atrial fibrillation which underlies the paced rhythm.  Continue Eliquis 5 mg p.o. twice daily.  Continue carvedilol 25 mg p.o. twice daily.  3. Complete heart block [facility]) Patient has a pacemaker in place and follows with Dr. Lovena Le EP.  Recent remote device check was normal.  4. Aortic valve stenosis, etiology of cardiac valve disease unspecified Recent TTE showed possible evidence of low gradient severe aortic stenosis.  Please schedule a TEE for further evaluation.  Medication Adjustments/Labs and Tests Ordered: Current medicines are reviewed at length with the patient today.  Concerns regarding medicines are outlined above.   Disposition: Follow-up with Dr. Harl Bowie or APP 1 month.  Signed, Levell July, NP 10/29/2019 9:44 AM    [facility] [place], [place], [place] [address] Phone: [phone]; Fax: [phone]

## 2019-10-29 ENCOUNTER — Encounter: Payer: Self-pay | Admitting: Family Medicine

## 2019-10-29 ENCOUNTER — Ambulatory Visit (INDEPENDENT_AMBULATORY_CARE_PROVIDER_SITE_OTHER): Payer: Medicare HMO | Admitting: Family Medicine

## 2019-10-29 VITALS — BP 100/68 | HR 79 | Ht 72.0 in | Wt 228.2 lb

## 2019-10-29 DIAGNOSIS — I442 Atrioventricular block, complete: Secondary | ICD-10-CM | POA: Diagnosis not present

## 2019-10-29 DIAGNOSIS — I350 Nonrheumatic aortic (valve) stenosis: Secondary | ICD-10-CM | POA: Diagnosis not present

## 2019-10-29 DIAGNOSIS — I5033 Acute on chronic diastolic (congestive) heart failure: Secondary | ICD-10-CM

## 2019-10-29 DIAGNOSIS — I4891 Unspecified atrial fibrillation: Secondary | ICD-10-CM

## 2019-10-29 NOTE — Patient Instructions (Signed)
Medication Instructions:  Continue all current medications.  Labwork: none  Testing/Procedures: Your physician has requested that you have a TEE. During a TEE, sound waves are used to create images of your heart. It provides your doctor with information about the size and shape of your heart and how well your hearts chambers and valves are working. In this test, a transducer is attached to the end of a flexible tube thats guided down your throat and into your esophagus (the tube leading from you mouth to your stomach) to get a more detailed image of your heart. You are not awake for the procedure. Please see the instruction sheet given to you today. For further information please visit [email or web address].  Follow-Up: 1 month   Any Other Special Instructions Will Be Listed Below (If Applicable).  If you need a refill on your cardiac medications before your next appointment, please call your pharmacy.

## 2019-10-30 ENCOUNTER — Encounter: Payer: Self-pay | Admitting: *Deleted

## 2019-10-30 ENCOUNTER — Telehealth: Payer: Self-pay | Admitting: Family Medicine

## 2019-10-30 NOTE — Telephone Encounter (Signed)
Patient called to see if the TEE has been scheduled.

## 2019-10-30 NOTE — Telephone Encounter (Signed)
Reply from Dr. Harl Bowie today at 3:15 - August 12, would have to schedule late morning after morning clinic patients  3:18 - call placed to [facility] scheduling [phone]) and left message for return call.

## 2019-10-30 NOTE — Telephone Encounter (Signed)
Left message to return call 

## 2019-11-02 ENCOUNTER — Other Ambulatory Visit: Payer: Self-pay | Admitting: Family Medicine

## 2019-11-02 NOTE — Telephone Encounter (Addendum)
Appointment for TEE rescheduled to different time on 11/05/19-per Kim in [place] prefers cardiology procedures to be done from 9:30-11:30 am. New time 9:30 am on 11/05/19.  Pre-Admission appointment-11/03/19 @12 :45 pm Covid testing appointment-11/03/19 @12 :45 pm

## 2019-11-02 NOTE — Telephone Encounter (Signed)
TEE appointment and instructions read to patient Verbalized understanding

## 2019-11-02 NOTE — Telephone Encounter (Signed)
I don't know why I did that. I may have to get you to show me how to delete the order. Can you show me before you leave today ?

## 2019-11-02 NOTE — Telephone Encounter (Signed)
Should be just for TEE correct, not cardioversion   Zandra Abts MD

## 2019-11-02 NOTE — Patient Instructions (Addendum)
Transesophageal Echocardiogram Transesophageal echocardiogram (TEE) is a test that uses sound waves to take pictures of your heart. TEE is done by passing a flexible tube down the esophagus. The esophagus is the tube that carries food from the throat to the stomach. The pictures give detailed images of your heart. This can help your doctor see if there are problems with your heart. What happens before the procedure?      John [name]  11/02/2019     @PREFPERIOPPHARMACY @   Your procedure is scheduled on  11/05/2019.  Report to [facility] at  0800  A.M.  Call this number if you have problems the morning of surgery:  [phone]   Remember:  Do not eat or drink after midnight.                        Take these medicines the morning of surgery with A SIP OF WATER hydrocodone, coreg.    Do not wear jewelry, make-up or nail polish.  Do not wear lotions, powders, or perfumes. Please wear deodorant and brush your teeth.  Do not shave 48 hours prior to surgery.  Men may shave face and neck.  Do not bring valuables to the hospital.  [facility] is not responsible for any belongings or valuables.  Contacts, dentures or bridgework may not be worn into surgery.  Leave your suitcase in the car.  After surgery it may be brought to your room.  For patients admitted to the hospital, discharge time will be determined by your treatment team.  Patients discharged the day of surgery will not be allowed to drive home.   Name and phone number of your driver:   Family Special instructions:  DO NOT smoke the morning of your procedure.  Please read over the following fact sheets that you were given. Anesthesia Post-op Instructions and Care and Recovery After Surgery       General instructions  You will need to take out any dentures or retainers.  Plan to have someone take you home from the hospital or clinic.  If you will be going home right after the procedure, plan to have someone  with you for 24 hours.  Ask your doctor about: ? Changing or stopping your normal medicines. This is important if you take diabetes medicines or blood thinners. ? Taking over-the-counter medicines, vitamins, herbs, and supplements. ? Taking medicines such as aspirin and ibuprofen. These medicines can thin your blood. Do not take these medicines unless your doctor tells you to take them. What happens during the procedure?  To lower your risk of infection, your doctors will wash or clean their hands.  An IV will be put into one of your veins.  You will be given a medicine to help you relax (sedative).  A medicine may be sprayed or gargled. This numbs the back of your throat.  Your blood pressure, heart rate, and breathing will be watched.  You may be asked to lay on your left side.  A bite block will be placed in your mouth. This keeps you from biting the tube.  The tip of the TEE probe will be placed into the back of your mouth.  You will be asked to swallow.  Your doctor will take pictures of your heart.  The probe and bite block will be taken out. The procedure may vary among doctors and hospitals. What happens after the procedure?   Your blood pressure, heart rate,  breathing rate, and blood oxygen level will be watched until the medicines you were given have worn off.  When you first wake up, your throat may feel sore and numb. This will get better over time. You will not be allowed to eat or drink until the numbness has gone away.  Do not drive for 24 hours if you were given a medicine to help you relax. Summary  TEE is a test that uses sound waves to take pictures of your heart.  You will be given a medicine to help you relax.  Do not drive for 24 hours if you were given a medicine to help you relax. This information is not intended to replace advice given to you by your health care provider. Make sure you discuss any questions you have with your health care  provider. Document Revised: 11/29/2017 Document Reviewed: 06/13/2016 Elsevier Patient Education  2020 [place] After These instructions provide you with information about caring for yourself after your procedure. Your health care provider may also give you more specific instructions. Your treatment has been planned according to current medical practices, but problems sometimes occur. Call your health care provider if you have any problems or questions after your procedure. What can I expect after the procedure? After your procedure, you may:  Feel sleepy for several hours.  Feel clumsy and have poor balance for several hours.  Feel forgetful about what happened after the procedure.  Have poor judgment for several hours.  Feel nauseous or vomit.  Have a sore throat if you had a breathing tube during the procedure. Follow these instructions at home: For at least 24 hours after the procedure:      Have a responsible adult stay with you. It is important to have someone help care for you until you are awake and alert.  Rest as needed.  Do not: ? Participate in activities in which you could fall or become injured. ? Drive. ? Use heavy machinery. ? Drink alcohol. ? Take sleeping pills or medicines that cause drowsiness. ? Make important decisions or sign legal documents. ? Take care of children on your own. Eating and drinking  Follow the diet that is recommended by your health care provider.  If you vomit, drink water, juice, or soup when you can drink without vomiting.  Make sure you have little or no nausea before eating solid foods. General instructions  Take over-the-counter and prescription medicines only as told by your health care provider.  If you have sleep apnea, surgery and certain medicines can increase your risk for breathing problems. Follow instructions from your health care provider about wearing your sleep  device: ? Anytime you are sleeping, including during daytime naps. ? While taking prescription pain medicines, sleeping medicines, or medicines that make you drowsy.  If you smoke, do not smoke without supervision.  Keep all follow-up visits as told by your health care provider. This is important. Contact a health care provider if:  You keep feeling nauseous or you keep vomiting.  You feel light-headed.  You develop a rash.  You have a fever. Get help right away if:  You have trouble breathing. Summary  For several hours after your procedure, you may feel sleepy and have poor judgment.  Have a responsible adult stay with you for at least 24 hours or until you are awake and alert. This information is not intended to replace advice given to you by your health care provider. Make sure  you discuss any questions you have with your health care provider. Document Revised: 06/10/2017 Document Reviewed: 07/03/2015 Elsevier Patient Education  [place].

## 2019-11-02 NOTE — Pre-Procedure Instructions (Signed)
Note sent to Levell July, NP because booking and order for consent do not match.

## 2019-11-02 NOTE — Telephone Encounter (Signed)
Thanks, I need to put in the orders. I will get Dr Harl Bowie to show me how.

## 2019-11-02 NOTE — Telephone Encounter (Signed)
I put the TEE / Cardioversion orders in. I hope they are right since this was the first time I have done them. Thanks

## 2019-11-03 ENCOUNTER — Other Ambulatory Visit: Payer: Self-pay

## 2019-11-03 ENCOUNTER — Other Ambulatory Visit (HOSPITAL_COMMUNITY)
Admission: RE | Admit: 2019-11-03 | Discharge: 2019-11-03 | Disposition: A | Discharge status: Home / Self Care | Payer: Medicare HMO | Source: Ambulatory Visit | Attending: Cardiology | Admitting: Cardiology

## 2019-11-03 ENCOUNTER — Encounter (HOSPITAL_COMMUNITY)
Admission: RE | Admit: 2019-11-03 | Discharge: 2019-11-03 | Disposition: A | Discharge status: Home / Self Care | Payer: Medicare HMO | Source: Ambulatory Visit | Attending: Cardiology | Admitting: Cardiology

## 2019-11-03 DIAGNOSIS — Z01812 Encounter for preprocedural laboratory examination: Secondary | ICD-10-CM | POA: Insufficient documentation

## 2019-11-03 DIAGNOSIS — Z20822 Contact with and (suspected) exposure to covid-19: Secondary | ICD-10-CM | POA: Insufficient documentation

## 2019-11-03 LAB — BASIC METABOLIC PANEL
Anion gap: 11 (ref 5–15)
BUN: 39 mg/dL — ABNORMAL HIGH (ref 8–23)
CO2: 24 mmol/L (ref 22–32)
Calcium: 8.8 mg/dL — ABNORMAL LOW (ref 8.9–10.3)
Chloride: 96 mmol/L — ABNORMAL LOW (ref 98–111)
Creatinine, Ser: 1.6 mg/dL — ABNORMAL HIGH (ref 0.61–1.24)
GFR calc Af Amer: 49 mL/min — ABNORMAL LOW (ref >60)
GFR calc non Af Amer: 42 mL/min — ABNORMAL LOW (ref >60)
Glucose, Bld: 127 mg/dL — ABNORMAL HIGH (ref 70–99)
Potassium: 3.5 mmol/L (ref 3.5–5.1)
Sodium: 131 mmol/L — ABNORMAL LOW (ref 135–145)

## 2019-11-03 LAB — HEMOGLOBIN A1C
Hgb A1c MFr Bld: 6.5 % — ABNORMAL HIGH (ref 4.8–5.6)
Mean Plasma Glucose: 139.85 mg/dL

## 2019-11-03 LAB — PROTIME-INR
INR: 1.7 — ABNORMAL HIGH (ref 0.8–1.2)
Prothrombin Time: 19.6 s — ABNORMAL HIGH (ref 11.4–15.2)

## 2019-11-03 NOTE — Telephone Encounter (Signed)
I think I corrected the mistake. You may want to check behind me. I had lydia cancel both the orders and I re-entered the TEE orders. Sorry for the mistake

## 2019-11-04 ENCOUNTER — Other Ambulatory Visit: Payer: Self-pay | Admitting: Cardiology

## 2019-11-04 LAB — SARS CORONAVIRUS 2 (TAT 6-24 HRS): SARS Coronavirus 2: NEGATIVE

## 2019-11-04 NOTE — Telephone Encounter (Signed)
Looks fine  JB

## 2019-11-05 ENCOUNTER — Inpatient Hospital Stay: Payer: Medicare HMO | Admitting: Family Medicine

## 2019-11-05 ENCOUNTER — Ambulatory Visit (HOSPITAL_COMMUNITY): Payer: Medicare HMO | Admitting: Certified Registered"

## 2019-11-05 ENCOUNTER — Encounter (HOSPITAL_COMMUNITY)
Admission: RE | Disposition: A | Discharge status: Home / Self Care | Payer: Self-pay | Source: Home / Self Care | Attending: Cardiology

## 2019-11-05 ENCOUNTER — Ambulatory Visit (HOSPITAL_COMMUNITY)
Admission: RE | Admit: 2019-11-05 | Discharge: 2019-11-05 | Disposition: A | Discharge status: Home / Self Care | Payer: Medicare HMO | Attending: Cardiology | Admitting: Cardiology

## 2019-11-05 ENCOUNTER — Other Ambulatory Visit: Payer: Self-pay

## 2019-11-05 ENCOUNTER — Encounter (HOSPITAL_COMMUNITY): Payer: Self-pay | Admitting: Cardiology

## 2019-11-05 ENCOUNTER — Ambulatory Visit (HOSPITAL_BASED_OUTPATIENT_CLINIC_OR_DEPARTMENT_OTHER): Payer: Medicare HMO

## 2019-11-05 DIAGNOSIS — I130 Hypertensive heart and chronic kidney disease with heart failure and stage 1 through stage 4 chronic kidney disease, or unspecified chronic kidney disease: Secondary | ICD-10-CM | POA: Diagnosis not present

## 2019-11-05 DIAGNOSIS — Z794 Long term (current) use of insulin: Secondary | ICD-10-CM | POA: Diagnosis not present

## 2019-11-05 DIAGNOSIS — I361 Nonrheumatic tricuspid (valve) insufficiency: Secondary | ICD-10-CM

## 2019-11-05 DIAGNOSIS — E1122 Type 2 diabetes mellitus with diabetic chronic kidney disease: Secondary | ICD-10-CM | POA: Insufficient documentation

## 2019-11-05 DIAGNOSIS — Z950 Presence of cardiac pacemaker: Secondary | ICD-10-CM | POA: Diagnosis not present

## 2019-11-05 DIAGNOSIS — Z6833 Body mass index (BMI) 33.0-33.9, adult: Secondary | ICD-10-CM | POA: Diagnosis not present

## 2019-11-05 DIAGNOSIS — I5089 Other heart failure: Secondary | ICD-10-CM

## 2019-11-05 DIAGNOSIS — I340 Nonrheumatic mitral (valve) insufficiency: Secondary | ICD-10-CM

## 2019-11-05 DIAGNOSIS — Z79899 Other long term (current) drug therapy: Secondary | ICD-10-CM | POA: Insufficient documentation

## 2019-11-05 DIAGNOSIS — I4892 Unspecified atrial flutter: Secondary | ICD-10-CM | POA: Insufficient documentation

## 2019-11-05 DIAGNOSIS — I5043 Acute on chronic combined systolic (congestive) and diastolic (congestive) heart failure: Secondary | ICD-10-CM | POA: Diagnosis not present

## 2019-11-05 DIAGNOSIS — I480 Paroxysmal atrial fibrillation: Secondary | ICD-10-CM | POA: Diagnosis not present

## 2019-11-05 DIAGNOSIS — I083 Combined rheumatic disorders of mitral, aortic and tricuspid valves: Secondary | ICD-10-CM | POA: Diagnosis not present

## 2019-11-05 DIAGNOSIS — I509 Heart failure, unspecified: Secondary | ICD-10-CM | POA: Diagnosis not present

## 2019-11-05 DIAGNOSIS — Z87891 Personal history of nicotine dependence: Secondary | ICD-10-CM | POA: Insufficient documentation

## 2019-11-05 DIAGNOSIS — I442 Atrioventricular block, complete: Secondary | ICD-10-CM | POA: Diagnosis not present

## 2019-11-05 DIAGNOSIS — E785 Hyperlipidemia, unspecified: Secondary | ICD-10-CM | POA: Diagnosis not present

## 2019-11-05 DIAGNOSIS — R945 Abnormal results of liver function studies: Secondary | ICD-10-CM | POA: Insufficient documentation

## 2019-11-05 DIAGNOSIS — Z7901 Long term (current) use of anticoagulants: Secondary | ICD-10-CM | POA: Diagnosis not present

## 2019-11-05 DIAGNOSIS — N183 Chronic kidney disease, stage 3 unspecified: Secondary | ICD-10-CM | POA: Insufficient documentation

## 2019-11-05 HISTORY — PX: TEE WITHOUT CARDIOVERSION: SHX5443

## 2019-11-05 LAB — GLUCOSE, CAPILLARY
Glucose-Capillary: 124 mg/dL — ABNORMAL HIGH (ref 70–99)
Glucose-Capillary: 112 mg/dL — ABNORMAL HIGH (ref 70–99)

## 2019-11-05 SURGERY — ECHOCARDIOGRAM, TRANSESOPHAGEAL
Anesthesia: General

## 2019-11-05 MED ORDER — GLYCOPYRROLATE 0.2 MG/ML IJ SOLN
0.2000 mg | Freq: Once | INTRAMUSCULAR | Status: AC
  Administered 2019-11-05: 0.2 mg via INTRAVENOUS

## 2019-11-05 MED ORDER — GLYCOPYRROLATE 0.2 MG/ML IJ SOLN
INTRAMUSCULAR | Status: AC
  Filled 2019-11-05: qty 1

## 2019-11-05 MED ORDER — LACTATED RINGERS IV SOLN: INTRAVENOUS | Status: DC

## 2019-11-05 MED ORDER — LACTATED RINGERS IV SOLN: INTRAVENOUS | Status: DC | PRN

## 2019-11-05 MED ORDER — PHENYLEPHRINE HCL (PRESSORS) 10 MG/ML IV SOLN
INTRAVENOUS | Status: AC
  Filled 2019-11-05: qty 1

## 2019-11-05 MED ORDER — PROPOFOL 10 MG/ML IV BOLUS
INTRAVENOUS | Status: AC
  Filled 2019-11-05: qty 40

## 2019-11-05 MED ORDER — PHENYLEPHRINE 40 MCG/ML (10ML) SYRINGE FOR IV PUSH (FOR BLOOD PRESSURE SUPPORT)
PREFILLED_SYRINGE | INTRAVENOUS | Status: AC
  Filled 2019-11-05: qty 40

## 2019-11-05 MED ORDER — PHENYLEPHRINE HCL-NACL 20-0.9 MG/250ML-% IV SOLN
INTRAVENOUS | Status: DC | PRN
  Administered 2019-11-05: 80 ug/min via INTRAVENOUS

## 2019-11-05 MED ORDER — PROPOFOL 10 MG/ML IV BOLUS: INTRAVENOUS | Status: DC | PRN

## 2019-11-05 MED ORDER — PROPOFOL 10 MG/ML IV BOLUS
INTRAVENOUS | Status: DC | PRN
  Administered 2019-11-05: 50 mg via INTRAVENOUS

## 2019-11-05 MED ORDER — SODIUM CHLORIDE 0.9 % IV SOLN: INTRAVENOUS | Status: DC

## 2019-11-05 MED ORDER — LIDOCAINE VISCOUS HCL 2 % MT SOLN
OROMUCOSAL | Status: AC
  Filled 2019-11-05: qty 15

## 2019-11-05 MED ORDER — PROPOFOL 500 MG/50ML IV EMUL
INTRAVENOUS | Status: DC | PRN
  Administered 2019-11-05: 60 ug/kg/min via INTRAVENOUS

## 2019-11-05 MED ORDER — LIDOCAINE VISCOUS HCL 2 % MT SOLN
15.0000 mL | Freq: Once | OROMUCOSAL | Status: AC
  Administered 2019-11-05: 15 mL via OROMUCOSAL

## 2019-11-05 NOTE — Anesthesia Preprocedure Evaluation (Signed)
Anesthesia Evaluation  Patient identified by MRN, date of birth, ID band Patient awake    Reviewed: Allergy & Precautions, H&P , NPO status , Patient's Chart, lab work & pertinent test results, reviewed documented beta blocker date and time   Airway Mallampati: III  TM Distance: >3 FB Neck ROM: full    Dental no notable dental hx.    Pulmonary neg pulmonary ROS, former smoker,    Pulmonary exam normal breath sounds clear to auscultation       Cardiovascular Exercise Tolerance: Good hypertension, +CHF  + dysrhythmias + pacemaker  Rhythm:regular Rate:Normal     Neuro/Psych negative neurological ROS  negative psych ROS   GI/Hepatic negative GI ROS, Neg liver ROS,   Endo/Other  negative endocrine ROSdiabetes  Renal/GU CRFRenal disease  negative genitourinary   Musculoskeletal   Abdominal   Peds  Hematology negative hematology ROS (+)   Anesthesia Other Findings   Reproductive/Obstetrics negative OB ROS                             Anesthesia Physical Anesthesia Plan  ASA: III  Anesthesia Plan: General   Post-op Pain Management:    Induction:   PONV Risk Score and Plan: Propofol infusion  Airway Management Planned:   Additional Equipment:   Intra-op Plan:   Post-operative Plan:   Informed Consent: I have reviewed the patients History and Physical, chart, labs and discussed the procedure including the risks, benefits and alternatives for the proposed anesthesia with the patient or authorized representative who has indicated his/her understanding and acceptance.     Dental Advisory Given  Plan Discussed with: CRNA  Anesthesia Plan Comments:         Anesthesia Quick Evaluation

## 2019-11-05 NOTE — Progress Notes (Signed)
Patient to Endo 3 for TEE with propofol. Anesthesia present, With Dr. Harl Bowie. Time Out performed at 1005. Patient placed on Left side with bolster behind back and pillows.

## 2019-11-05 NOTE — H&P (Signed)
Patient presents for outpatient TEE to further evaluate mitral regurgitation in setting of recent decrease in LV systolic dysfunction. Please see referenced recent clinic note for full medical history.  09/2019 echo: LVEF 30-35%, mild RV dysfunction, mod to severe MR, AV mean grad 15.5 AVA VTI 0.86 Dimensionless index 0.23 SVI 22  John Dolly MD      Clinical Summary [name] is a 74 y.o.male seen today for follow up of the following medical problems. Last seen by Dr John [name], this is our first visti togeter.   1. Chronic combined systolic/diastolic HF - 11/4501 echo LVEF 45-50%, grade II DDx, normal RV, mild aortic stenosis.  - some recent edema, some SOB/DOE. Mild orthopnea. Home weights around 243 lbs.  - compliant with meds - takes torsemide 40m in AM and 285min PM, takes metolazone prn. Takes about 1 month.   2. Aflutter - s/p ablation in 2012   3. PAF - on eliquis for stroke prevention - no recent palpitations   4. Complete heart block - had pacemaker placed in 2017 - normal device function by Jan 2021 check, upcoming check later this month  5. HTN - compliant with meds   6. Hyperlipidemia - 07/2019 T C 178 TG 100 HDL 51 LDL 107   7. CKD 3 - Cr stable 1.2 to 1.3  8. Aortic stenosis - 07/2018 echo AVA VTI 1, mean grad 11.5, DI 0.23, SVI 33 - mixed data on severity, particularly with the low SVI - I reviewed the study, morphologically looks closer to moderate AS  9. Abnormal liver tests - pcp has referred to GI        Past Medical History:  Diagnosis Date  . Atrial flutter ([facility]10/2012   Admitted with symptomatic bradycardia (HR 40s), atrial flutter with slow ventricular response 12/2010 + volume overload; AV nodal agents d/c'd and Pradaxa started; RFA in 01/2011  . CHF (congestive heart failure) ([facility]  . Chronic combined systolic and diastolic heart failure ([facility]   a. echo 01/06/11: mild LVH, EF 65-70%, mod to severe LAE,  mild RVE, mild RAE, PASP 32;   TEE 10/12: EF 45-50% b. EF 45 to 50% by echo in 07/2018  . Class 2 severe obesity due to excess calories with serious comorbidity and body mass index (BMI) of 37.0 to 37.9 in adult (HC[facility]odes[facility]4/24/2019  . Diabetes mellitus    non insulin dependant  . Hyperlipidemia   . Hypertension   . Osteoarthritis   . Presence of permanent cardiac pacemaker   . PSVT (paroxysmal supraventricular tachycardia) (HCC)    Possibly atrial flutter     No Known Allergies         Current Outpatient Medications  Medication Sig Dispense Refill  . acetaminophen (TYLENOL) 500 MG tablet Take 1,000 mg by mouth every 6 (six) hours as needed for moderate pain (thigh pain).    . benzonatate (TESSALON) 100 MG capsule TAKE 1 CAPSULE THREE TIMES DAILY AS NEEDED FOR COUGH 90 capsule 1  . carvedilol (COREG) 25 MG tablet TAKE 1 TABLET (25 MG TOTAL) BY MOUTH 2 (TWO) TIMES DAILY WITH A MEAL. 180 tablet 3  . ELIQUIS 5 MG TABS tablet TAKE 1 TABLET TWICE DAILY 180 tablet 3  . HYDROcodone-acetaminophen (NORCO/VICODIN) 5-325 MG tablet Take one tablet po every 4 hrs prn pain (Patient taking differently: Take 1 tablet by mouth every 4 (four) hours as needed for moderate pain. ) 28 tablet 0  . hydrocortisone 2.5 % cream Apply BID  to affected area 60 g 3  . insulin degludec (TRESIBA FLEXTOUCH) 200 UNIT/ML FlexTouch Pen Inject 60 Units into the skin at bedtime. 10 pen 3  . Insulin Pen Needle (BD PEN NEEDLE NANO U/F) 32G X 4 MM MISC 1 each by Does not apply route 4 (four) times daily. 150 each 5  . Magnesium 400 MG CAPS Take 400 mg by mouth daily.     . metFORMIN (GLUCOPHAGE) 500 MG tablet Take 1 tablet (500 mg total) by mouth 2 (two) times daily with a meal. 180 tablet 1  . metolazone (ZAROXOLYN) 2.5 MG tablet Take 1 tablet (2.5 mg total) by mouth 2 (two) times a week. Monday and Friday 15 tablet 1  . Multiple Vitamins-Minerals (MULTIVITAMIN WITH MINERALS) tablet Take 1 tablet by mouth daily.     . potassium chloride SA (KLOR-CON) 20 MEQ tablet Take 60 meq in the am, at lunch, and in the evening 270 tablet 6  . Resveratrol 100 MG CAPS Take 100 mg by mouth at bedtime.     . rosuvastatin (CRESTOR) 20 MG tablet Take 1 tablet (20 mg total) by mouth daily. 90 tablet 1  . sildenafil (VIAGRA) 50 MG tablet Take 1 tablet (50 mg total) by mouth daily as needed for erectile dysfunction. 30 tablet 3  . torsemide (DEMADEX) 20 MG tablet TAKE 3 TABLETS (60 MG TOTAL) BY MOUTH 2 (TWO) TIMES DAILY. DOSE CHANGE.  540 tablet 1  . TRULICITY 1.5 HF/0.2OV SOPN INJECT 1.5MG (1 PEN) SUBCUTANEOUSLY EVERY WEEK 2 mL 1   No current facility-administered medications for this visit.          Past Surgical History:  Procedure Laterality Date  . ATRIAL FLUTTER ABLATION N/A 02/07/2011   Procedure: ATRIAL FLUTTER ABLATION;  Surgeon: Evans Lance, MD;  Location[facility] CATH LAB;  Service: Cardiovascular;  Laterality: N/A;  . CARDIAC ELECTROPHYSIOLOGY STUDY AND ABLATION  02/07/11  . CARDIOVERSION N/A 03/23/2016   Procedure: CARDIOVERSION;  Surgeon: Evans Lance, MD;  Location: [place];  Service: Cardiovascular;  Laterality: N/A;  . COLONOSCOPY N/A 04/17/2017   Procedure: COLONOSCOPY;  Surgeon: Rogene Houston, MD;  Location: [facility];  Service: Endoscopy;  Laterality: N/A;  830  . EP IMPLANTABLE DEVICE N/A 08/29/2015   Procedure: Pacemaker Implant;  Surgeon: Evans Lance, MD;  Location: [place] CV LAB;  Service: Cardiovascular;  Laterality: N/A;  . EP IMPLANTABLE DEVICE N/A 12/07/2015   Procedure: PPM Lead Revision/Repair;  Surgeon: Evans Lance, MD;  Location: [place] CV LAB;  Service: Cardiovascular;  Laterality: N/A;  . KNEE ARTHROSCOPY  2005   left  . LUMBAR SPINE SURGERY     "I've had 6 ORs 1972 thru 2004"  . POLYPECTOMY  04/17/2017   Procedure: POLYPECTOMY;  Surgeon: Rogene Houston, MD;  Location: [facility];  Service: Endoscopy;;  transverse colon x3;      No Known Allergies         Family History  Problem Relation Age of Onset  . Heart attack Mother   . Hypertension Mother   . Heart attack Father   . Hypertension Father   . Heart attack Brother   . Colon cancer Neg Hx      Social History [name] reports that he quit smoking about 33 years ago. His smoking use included cigarettes. He has a 10.00 pack-year smoking history. He has never used smokeless tobacco. Mr. Shinault reports no history of alcohol use.   Review of Systems  CONSTITUTIONAL: No weight loss, fever, chills, weakness or fatigue.  HEENT: Eyes: No visual loss, blurred vision, double vision or yellow sclerae.No hearing loss, sneezing, congestion, runny nose or sore throat.  SKIN: No rash or itching.  CARDIOVASCULAR: per hpi RESPIRATORY: No shortness of breath, cough or sputum.  GASTROINTESTINAL: No anorexia, nausea, vomiting or diarrhea. No abdominal pain or blood.  GENITOURINARY: No burning on urination, no polyuria NEUROLOGICAL: No headache, dizziness, syncope, paralysis, ataxia, numbness or tingling in the extremities. No change in bowel or bladder control.  MUSCULOSKELETAL: No muscle, back pain, joint pain or stiffness.  LYMPHATICS: No enlarged nodes. No history of splenectomy.  PSYCHIATRIC: No history of depression or anxiety.  ENDOCRINOLOGIC: No reports of sweating, cold or heat intolerance. No polyuria or polydipsia.  Marland Kitchen   Physical Examination    Today's Vitals   09/30/19 0953  BP: 115/78  Pulse: (!) 51  SpO2: 99%  Weight: 249 lb 6.4 oz (113.1 kg)  Height: 6' (1.829 m)   Body mass index is 33.82 kg/m.  Gen: resting comfortably, no acute distress HEENT: no scleral icterus, pupils equal round and reactive, no palptable cervical adenopathy,  CV: RRR, 2/6 systolic murmur rusb. +JVD Resp: mild crackles bilaterally GI: abdomen is soft, non-tender, non-distended, normal bowel sounds, no hepatosplenomegaly MSK: extremities  are warm, 2+ bialteral LE edema Skin: warm, no rash Neuro:  no focal deficits Psych: appropriate affect   Diagnostic Studies  Echocardiogram: 07/2018 IMPRESSIONS   1. The left ventricle has mildly reduced systolic function, with an ejection fraction of 45-50%. The cavity size was mild to moderately dilated. There is mildly increased left ventricular wall thickness. Left ventricular diastolic Doppler parameters are consistent with pseudonormalization. Left ventrical global hypokinesis without regional wall motion abnormalities. 2. The right ventricle has normal systolic function. The cavity was normal. There is no increase in right ventricular wall thickness. 3. Left atrial size was severely dilated. 4. The aortic valve is tricuspid. Mild thickening of the aortic valve. Moderate calcification of the aortic valve. Mild stenosis of the aortic valve. 5. The ascending aorta is normal in size and structure. 6. The inferior vena cava was dilated in size with <50% respiratory variability.   Assessment and Plan  1. Acute on chronic combined systolic/diastolic HF - signs of fluid overload on exam - increase torsemide to 51m bid. Would continue metolazone just sparingly, favor titratring his loop diuretic to control his edema and limit metolazone use - check BMET/Mg  2 weeks  2. Afib -no symptoms, EKG today shows V paced rhythm - continue eliquis  3. Complete heart block - normal device function on last check, due for recheck coming up - continue to monitor  4. Aortic stenosis - mixed parameters based on last years echo, moderate to severe by AVA and DI, mild gradient. Had a low reported stroke volume index. Unclear if he has paradoxical low flow AS as LVEF has not been that bad. Visually/morphologically the AS looks probably moderate.  - repeat echo.    F/u 1 month  JCarlyle DollyMD

## 2019-11-05 NOTE — Transfer of Care (Signed)
Immediate Anesthesia Transfer of Care Note  Patient: LENIN KUHNLE  Procedure(s) Performed: TRANSESOPHAGEAL ECHOCARDIOGRAM (TEE) WITH PROPOFOL (N/A )  Patient Location: PACU  Anesthesia Type:General  Level of Consciousness: awake, drowsy and patient cooperative  Airway & Oxygen Therapy: Patient Spontanous Breathing and Patient connected to nasal cannula oxygen  Post-op Assessment: Report given to RN and Post -op Vital signs reviewed and stable  Post vital signs: Reviewed and stable  Last Vitals:  Vitals Value Taken Time  BP    Temp    Pulse 71 11/05/19 1038  Resp 14 11/05/19 1038  SpO2 98 % 11/05/19 1038  Vitals shown include unvalidated device data.  Last Pain:  Vitals:   11/05/19 0831  PainSc: 0-No pain         Complications: No complications documented.

## 2019-11-05 NOTE — Discharge Instructions (Signed)
Monitored Anesthesia Care, Care After These instructions provide you with information about caring for yourself after your procedure. Your health care provider may also give you more specific instructions. Your treatment has been planned according to current medical practices, but problems sometimes occur. Call your health care provider if you have any problems or questions after your procedure. What can I expect after the procedure? After your procedure, you may:  Feel sleepy for several hours.  Feel clumsy and have poor balance for several hours.  Feel forgetful about what happened after the procedure.  Have poor judgment for several hours.  Feel nauseous or vomit.  Have a sore throat if you had a breathing tube during the procedure. Follow these instructions at home: For at least 24 hours after the procedure:      Have a responsible adult stay with you. It is important to have someone help care for you until you are awake and alert.  Rest as needed.  Do not: ? Participate in activities in which you could fall or become injured. ? Drive. ? Use heavy machinery. ? Drink alcohol. ? Take sleeping pills or medicines that cause drowsiness. ? Make important decisions or sign legal documents. ? Take care of children on your own. Eating and drinking  Follow the diet that is recommended by your health care provider.  If you vomit, drink water, juice, or soup when you can drink without vomiting.  Make sure you have little or no nausea before eating solid foods. General instructions  Take over-the-counter and prescription medicines only as told by your health care provider.  If you have sleep apnea, surgery and certain medicines can increase your risk for breathing problems. Follow instructions from your health care provider about wearing your sleep device: ? Anytime you are sleeping, including during daytime naps. ? While taking prescription pain medicines, sleeping  medicines, or medicines that make you drowsy.  If you smoke, do not smoke without supervision.  Keep all follow-up visits as told by your health care provider. This is important. Contact a health care provider if:  You keep feeling nauseous or you keep vomiting.  You feel light-headed.  You develop a rash.  You have a fever. Get help right away if:  You have trouble breathing. Summary  For several hours after your procedure, you may feel sleepy and have poor judgment.  Have a responsible adult stay with you for at least 24 hours or until you are awake and alert. This information is not intended to replace advice given to you by your health care provider. Make sure you discuss any questions you have with your health care provider. Document Revised: 06/10/2017 Document Reviewed: 07/03/2015 Elsevier Patient Education  [place]. Transesophageal Echocardiogram Transesophageal echocardiogram (TEE) is a test that uses sound waves to take pictures of your heart. TEE is done by passing a flexible tube down the esophagus. The esophagus is the tube that carries food from the throat to the stomach. The pictures give detailed images of your heart. This can help your doctor see if there are problems with your heart. What happens before the procedure? Staying hydrated Follow instructions from your doctor about hydration, which may include:  Up to 3 hours before the procedure - you may continue to drink clear liquids, such as: ? Water. ? Clear fruit juice. ? Black coffee. ? Plain tea.  Eating and drinking Follow instructions from your doctor about eating and drinking, which may include:  8 hours  before the procedure - stop eating heavy meals or foods such as meat, fried foods, or fatty foods.  6 hours before the procedure - stop eating light meals or foods, such as toast or cereal.  6 hours before the procedure - stop drinking milk or drinks that contain milk.  3 hours before  the procedure - stop drinking clear liquids. General instructions  You will need to take out any dentures or retainers.  Plan to have someone take you home from the hospital or clinic.  If you will be going home right after the procedure, plan to have someone with you for 24 hours.  Ask your doctor about: ? Changing or stopping your normal medicines. This is important if you take diabetes medicines or blood thinners. ? Taking over-the-counter medicines, vitamins, herbs, and supplements. ? Taking medicines such as aspirin and ibuprofen. These medicines can thin your blood. Do not take these medicines unless your doctor tells you to take them. What happens during the procedure?  To lower your risk of infection, your doctors will wash or clean their hands.  An IV will be put into one of your veins.  You will be given a medicine to help you relax (sedative).  A medicine may be sprayed or gargled. This numbs the back of your throat.  Your blood pressure, heart rate, and breathing will be watched.  You may be asked to lay on your left side.  A bite block will be placed in your mouth. This keeps you from biting the tube.  The tip of the TEE probe will be placed into the back of your mouth.  You will be asked to swallow.  Your doctor will take pictures of your heart.  The probe and bite block will be taken out. The procedure may vary among doctors and hospitals. What happens after the procedure?   Your blood pressure, heart rate, breathing rate, and blood oxygen level will be watched until the medicines you were given have worn off.  When you first wake up, your throat may feel sore and numb. This will get better over time. You will not be allowed to eat or drink until the numbness has gone away.  Do not drive for 24 hours if you were given a medicine to help you relax. Summary  TEE is a test that uses sound waves to take pictures of your heart.  You will be given a medicine  to help you relax.  Do not drive for 24 hours if you were given a medicine to help you relax. This information is not intended to replace advice given to you by your health care provider. Make sure you discuss any questions you have with your health care provider. Document Revised: 11/29/2017 Document Reviewed: 06/13/2016 Elsevier Patient Education  [place].

## 2019-11-05 NOTE — CV Procedure (Signed)
Procedure: Transesophageal echocardiogram Physician: Dr [name] MD Indication: Mitral regurgitation, systolic dysfunction  The patient was brought to the procedure suite after appropriate consent was obtained. Sedation was achieved with the assistance of anesthesiology, for full details please refer to there note. He was placed in the left lateral decubtius position and TEE probe intubated without troubles. Several images were obtained. Cardiopulmonary monitoring was performed throughout the procedure, he tolerated well without complications  Please see full TEE report for findings. From initial review mild to moderate MR.   [name] MD

## 2019-11-05 NOTE — Anesthesia Postprocedure Evaluation (Signed)
Anesthesia Post Note  Patient: John [name]  Procedure(s) Performed: TRANSESOPHAGEAL ECHOCARDIOGRAM (TEE) WITH PROPOFOL (N/A )  Patient location during evaluation: PACU Anesthesia Type: General Level of consciousness: awake and patient cooperative Pain management: satisfactory to patient Vital Signs Assessment: post-procedure vital signs reviewed and stable Respiratory status: spontaneous breathing, respiratory function stable and nonlabored ventilation Cardiovascular status: stable Postop Assessment: no apparent nausea or vomiting Anesthetic complications: no   No complications documented.   Last Vitals:  Vitals:   11/05/19 0831 11/05/19 1032  BP: 108/76 (!) (P) 112/93  Pulse: 64 65  Resp: 12 15  Temp:  (P) 36.6 C  SpO2: 98% 95%    Last Pain:  Vitals:   11/05/19 0831  PainSc: 0-No pain                 [name]

## 2019-11-05 NOTE — Progress Notes (Signed)
*  PRELIMINARY RESULTS* Echocardiogram Echocardiogram Transesophageal has been performed.  John [name] 11/05/2019, 10:55 AM

## 2019-11-05 NOTE — Progress Notes (Signed)
Bernie aware of TEE and patient arrival for procedure.

## 2019-11-05 NOTE — Progress Notes (Signed)
Dr. Harl Bowie at bedside pt to left side with bolster and pillows

## 2019-11-06 ENCOUNTER — Encounter (HOSPITAL_COMMUNITY): Payer: Self-pay | Admitting: Cardiology

## 2019-11-06 LAB — ECHO TEE
MV M vel: 4.23 m/s
MV Peak grad: 71.6 mmHg
Radius: 0.4 cm

## 2019-11-06 NOTE — Addendum Note (Signed)
Addendum  created 11/06/19 0925 by Vista Deck, CRNA   Charge Capture section accepted

## 2019-11-11 ENCOUNTER — Encounter: Payer: Self-pay | Admitting: Nurse Practitioner

## 2019-11-16 ENCOUNTER — Encounter (INDEPENDENT_AMBULATORY_CARE_PROVIDER_SITE_OTHER): Payer: Self-pay | Admitting: Gastroenterology

## 2019-11-16 ENCOUNTER — Ambulatory Visit (INDEPENDENT_AMBULATORY_CARE_PROVIDER_SITE_OTHER): Payer: Medicare HMO | Admitting: Gastroenterology

## 2019-11-16 ENCOUNTER — Other Ambulatory Visit: Payer: Self-pay

## 2019-11-16 VITALS — BP 97/61 | HR 83 | Temp 97.9°F | Ht 71.0 in | Wt 230.2 lb

## 2019-11-16 DIAGNOSIS — R7989 Other specified abnormal findings of blood chemistry: Secondary | ICD-10-CM | POA: Diagnosis not present

## 2019-11-16 NOTE — Progress Notes (Signed)
Patient profile: John [name] is a 74 y.o. male seen for evaluation of elevated LFTS.  History of Present Illness: John [name] is seen today for evaluation of abnormal LFTs.  He reports feeling very well.  He denies any heartburn indigestion, GERD, nausea, vomiting, dysphagia.  Appetite good.  Having a bowel movement daily.  Denies any abdominal pain or blood in stool.  States he feels very well today.  Chronically he denies any frequent alcohol use.  No known exposures to viral hepatitis. No IVUD, blood transfusions, etc. Denies any family history of liver disease.  Notes that he stopped his statin as cholesterol looked good few months ago. No heavy tylenol use.    Wt Readings from Last 3 Encounters:  11/16/19 230 lb 3.2 oz (104.4 kg)  11/03/19 224 lb (101.6 kg)  10/29/19 228 lb 3.2 oz (103.5 kg)     Last Colonoscopy: 03/2017--Diverticulosis in the entire examined colon. - Two small polyps in the transverse colon. Biopsied. - One 6 mm polyp in the transverse colon, removed with a cold snare. Resected and retrieved. - External hemorrhoids.  Patient had 3 polyps removed and there tubular adenomas. Results reviewed with patient. Colonoscopy in 5 years  Last Endoscopy: none prior    Past Medical History:  Past Medical History:  Diagnosis Date  . Atrial flutter ([place]) 12/2010   Admitted with symptomatic bradycardia (HR 40s), atrial flutter with slow ventricular response 12/2010 + volume overload; AV nodal agents d/c'd and Pradaxa started; RFA in 01/2011  . CHF (congestive heart failure) ([place])   . Chronic combined systolic and diastolic heart failure ([place])    a. echo 01/06/11: mild LVH, EF 65-70%, mod to severe LAE, mild RVE, mild RAE, PASP 32;   TEE 10/12: EF 45-50% b. EF 45 to 50% by echo in 07/2018  . Class 2 severe obesity due to excess calories with serious comorbidity and body mass index (BMI) of 37.0 to 37.9 in adult ([place]) 07/17/2017  . Diabetes mellitus    non  insulin dependant  . Hyperlipidemia   . Hypertension   . Osteoarthritis   . Presence of permanent cardiac pacemaker   . PSVT (paroxysmal supraventricular tachycardia) ([place])    Possibly atrial flutter    Problem List: Patient Active Problem List   Diagnosis Date Noted  . Lower leg edema 10/14/2019  . Class 2 obesity   . Acute on chronic combined systolic and diastolic HF (heart failure) ([place]) 11/12/2018  . Hyponatremia 11/11/2018  . Hyperbilirubinemia 11/11/2018  . CHF (congestive heart failure) ([place]) 08/19/2018  . Stage 3 chronic kidney disease   . Paroxysmal atrial fibrillation ([facility])   . Acute on chronic systolic CHF (congestive heart failure) ([place]) 03/02/2018  . Pacemaker 03/02/2018  . History of colonic polyps 12/28/2016  . Atrial pacemaker lead displacement 12/07/2015  . Acute CHF (congestive heart failure) ([place]) 11/21/2015  . CHF, acute on chronic ([place]) 11/20/2015  . Complete heart block ([place]) 11/20/2015  . Chronic diastolic CHF (congestive heart failure) ([place]) 08/28/2015  . Hypertensive heart disease 08/28/2015  . NSVT (nonsustained ventricular tachycardia) ([place]) 08/28/2015  . Elevated troponin 08/28/2015  . Second degree heart block 08/25/2015  . Bradycardia 08/25/2015  . Acute diastolic CHF (congestive heart failure) ([place]) 08/25/2015  . Current use of long term anticoagulation 05/04/2011  . Atrial flutter ([place]) 01/05/2011  . DM type 2 causing vascular disease ([place]) 12/23/2009  . Mixed hyperlipidemia 12/23/2009  . Essential hypertension, benign 12/23/2009  . DEGENERATIVE JOINT DISEASE  12/23/2009    Past Surgical History: Past Surgical History:  Procedure Laterality Date  . ATRIAL FLUTTER ABLATION N/A 02/07/2011   Procedure: ATRIAL FLUTTER ABLATION;  Surgeon: Evans Lance, MD;  Location[facility] CATH LAB;  Service: Cardiovascular;  Laterality: N/A;  . CARDIAC ELECTROPHYSIOLOGY STUDY AND ABLATION  02/07/11  . CARDIOVERSION N/A 03/23/2016   Procedure: CARDIOVERSION;   Surgeon: Evans Lance, MD;  Location: [facility];  Service: Cardiovascular;  Laterality: N/A;  . COLONOSCOPY N/A 04/17/2017   Procedure: COLONOSCOPY;  Surgeon: Rogene Houston, MD;  Location[facility];  Service: Endoscopy;  Laterality: N/A;  830  . EP IMPLANTABLE DEVICE N/A 08/29/2015   Procedure: Pacemaker Implant;  Surgeon: Evans Lance, MD;  Location: [place] [facility] CV LAB;  Service: Cardiovascular;  Laterality: N/A;  . EP IMPLANTABLE DEVICE N/A 12/07/2015   Procedure: PPM Lead Revision/Repair;  Surgeon: Evans Lance, MD;  Location: [place] CV LAB;  Service: Cardiovascular;  Laterality: N/A;  . KNEE ARTHROSCOPY  2005   left  . LUMBAR SPINE SURGERY     "I've had 6 ORs 1972 thru 2004"  . POLYPECTOMY  04/17/2017   Procedure: POLYPECTOMY;  Surgeon: Rogene Houston, MD;  Location: [facility];  Service: Endoscopy;;  transverse colon x3;  . TEE WITHOUT CARDIOVERSION N/A 11/05/2019   Procedure: TRANSESOPHAGEAL ECHOCARDIOGRAM (TEE) WITH PROPOFOL;  Surgeon: Arnoldo Lenis, MD;  Location[facility];  Service: Endoscopy;  Laterality: N/A;    Allergies: No Known Allergies    Home Medications:  Current Outpatient Medications:  .  ACCU-CHEK AVIVA PLUS test strip, TEST BLOOD SUGAR TWICE DAILY BEFORE BREAKFAST AND AT BEDTIME, Disp: 200 strip, Rfl: 1 .  benzonatate (TESSALON) 100 MG capsule, TAKE 1 CAPSULE THREE TIMES DAILY AS NEEDED FOR COUGH (Patient taking differently: Take 100 mg by mouth 3 (three) times daily as needed for cough. ), Disp: 90 capsule, Rfl: 1 .  carvedilol (COREG) 25 MG tablet, TAKE 1 TABLET (25 MG TOTAL) BY MOUTH 2 (TWO) TIMES DAILY WITH A MEAL., Disp: 180 tablet, Rfl: 3 .  ELIQUIS 5 MG TABS tablet, TAKE 1 TABLET TWICE DAILY (Patient taking differently: Take 5 mg by mouth 2 (two) times daily. ), Disp: 180 tablet, Rfl: 3 .  HYDROcodone-acetaminophen (NORCO/VICODIN) 5-325 MG tablet, Take one tablet po every 4 hrs prn pain (Patient taking differently: Take 1 tablet  by mouth every 4 (four) hours as needed for moderate pain. ), Disp: 28 tablet, Rfl: 0 .  hydrocortisone 2.5 % cream, Apply BID to affected area (Patient taking differently: Apply 1 application topically 2 (two) times daily as needed (skin irritation.). ), Disp: 60 g, Rfl: 3 .  insulin degludec (TRESIBA FLEXTOUCH) 200 UNIT/ML FlexTouch Pen, Inject 60 Units into the skin at bedtime., Disp: 30 mL, Rfl: 1 .  Insulin Pen Needle (BD PEN NEEDLE NANO U/F) 32G X 4 MM MISC, 1 each by Does not apply route 4 (four) times daily., Disp: 150 each, Rfl: 5 .  metFORMIN (GLUCOPHAGE) 500 MG tablet, TAKE 1 TABLET (500 MG TOTAL) BY MOUTH 2 (TWO) TIMES DAILY WITH A MEAL., Disp: 180 tablet, Rfl: 1 .  metolazone (ZAROXOLYN) 2.5 MG tablet, Take 1 tablet (2.5 mg total) by mouth 2 (two) times a week. Monday and Friday (Patient taking differently: Take 2.5 mg by mouth every other day. ), Disp: 15 tablet, Rfl: 1 .  Multiple Vitamin (MULTIVITAMIN WITH MINERALS) TABS tablet, Take 1 tablet by mouth daily. One A Day for Men,  Disp: , Rfl:  .  potassium chloride SA (KLOR-CON) 20 MEQ tablet, Take 60 meq in the am, at lunch, and in the evening (Patient taking differently: Take 60 mEq by mouth in the morning and at bedtime. ), Disp: 270 tablet, Rfl: 6 .  sildenafil (VIAGRA) 50 MG tablet, Take 1 tablet (50 mg total) by mouth daily as needed for erectile dysfunction., Disp: 30 tablet, Rfl: 3 .  spironolactone (ALDACTONE) 25 MG tablet, Take 1 tablet (25 mg total) by mouth daily., Disp: 30 tablet, Rfl: 3 .  torsemide (DEMADEX) 20 MG tablet, Take 2 tablets (40 mg total) by mouth 2 (two) times daily., Disp: 120 tablet, Rfl: 3 .  TRULICITY 1.5 NG/2.9BM SOPN, INJECT 1.5MG (1 PEN) SUBCUTANEOUSLY EVERY WEEK (Patient taking differently: Inject 1.5 mg into the skin every Sunday. ), Disp: 6 mL, Rfl: 0 .  Zinc 50 MG TABS, Take 50 mg by mouth daily., Disp: , Rfl:  .  rosuvastatin (CRESTOR) 20 MG tablet, Take 1 tablet (20 mg total) by mouth daily. (Patient  not taking: Reported on 11/02/2019), Disp: 90 tablet, Rfl: 1   Family History: family history includes Heart attack in his brother, father, and mother; Hypertension in his father and mother.    Social History:   reports that he quit smoking about 33 years ago. His smoking use included cigarettes. He has a 10.00 pack-year smoking history. He has never used smokeless tobacco. He reports that he does not drink alcohol and does not use drugs.   Review of Systems: Constitutional: Denies weight loss/weight gain  Eyes: No changes in vision. ENT: No oral lesions, sore throat.  GI: see HPI.  Heme/Lymph: No easy bruising.  CV: No chest pain.  GU: No hematuria.  Integumentary: No rashes.  Neuro: No headaches.  Psych: No depression/anxiety.  Endocrine: No heat/cold intolerance.  Allergic/Immunologic: No urticaria.  Resp: No cough, SOB.  Musculoskeletal: No joint swelling.    Physical Examination: BP 97/61 (BP Location: Right Arm, Patient Position: Sitting, Cuff Size: Normal)   Pulse 83   Temp 97.9 F (36.6 C) (Oral)   Ht 5' 11"  (1.803 m)   Wt 230 lb 3.2 oz (104.4 kg)   BMI 32.11 kg/m  Gen: NAD, alert and oriented x 4 HEENT: PEERLA, EOMI, Neck: supple, no JVD Chest: CTA bilaterally, no wheezes, crackles, or other adventitious sounds CV: RRR, no m/g/c/r Abd: soft, NT, ND, +BS in all four quadrants; no HSM, guarding, ridigity, or rebound tenderness Ext: no edema, well perfused with 2+ pulses, Skin: no rash or lesions noted on observed skin Lymph: no noted LAD  Data Reviewed:  11/03/19--INR 1.7, BMP with creatinine 1.6, calcium 8.8, BUN 39, sodium 131, GFR 42,  May 2021-total bili 3.1, direct bili 0.9, indirect 2.2, alk phos 152, AST 45, ALT 28.  Creatinine 1.39, potassium 3.3, sodium 137.Platelet 93 normal hemoglobin and white count  September 2020-alk phos 207, total bili 2.9 AST 57, ALT 36  Assessment/Plan: Mr. Kuhrt is a 74 y.o. male   1.  Elevated alk phos and  bilirubin-ongoing at least a year.  No prior imaging.  Will order ultrasound of abdomen.  No high risk behaviors, etc.  Needs full serologic work-up for evaluation.  States he feels very well and has no GI symptoms.  Previously was on statin but stopped this a few months ago. Further recs pending review of labs, imaging, etc.   2. Hx of colon polyps - UTD on colonoscopy.   Kyandre was seen today for  follow-up.  Diagnoses and all orders for this visit:  Elevated LFTs -     US Abdomen Complete; Future -     Hepatic function panel -     Hepatitis C Antibody -     Fe+TIBC+Fer -     Hepatitis B Core Antibody, total -     Hepatitis B Surface AntiGEN -     Hepatitis B Surface AntiBODY -     Mitochondrial/smooth muscle ab pnl -     Antinuclear Antib (ANA) -     Mitochondrial antibodies -     Anti-Smooth Muscle Antibody, IGG    I personally performed the service, non-incident to. (WP)  Laurine Blazer, [facility]

## 2019-11-16 NOTE — Patient Instructions (Signed)
We are scheduling labs and ultrasound for evaluation of elevated liver enzymes.  We will call with results.

## 2019-11-17 ENCOUNTER — Other Ambulatory Visit: Payer: Self-pay | Admitting: "Endocrinology

## 2019-11-17 ENCOUNTER — Other Ambulatory Visit (INDEPENDENT_AMBULATORY_CARE_PROVIDER_SITE_OTHER): Payer: Self-pay | Admitting: Internal Medicine

## 2019-11-17 DIAGNOSIS — R7989 Other specified abnormal findings of blood chemistry: Secondary | ICD-10-CM | POA: Diagnosis not present

## 2019-11-19 LAB — SPECIMEN STATUS REPORT

## 2019-11-19 LAB — IRON AND TIBC
Iron Saturation: 27 % (ref 15–55)
Iron: 88 ug/dL (ref 38–169)
Total Iron Binding Capacity: 331 ug/dL (ref 250–450)
UIBC: 243 ug/dL (ref 111–343)

## 2019-11-19 LAB — HEPATIC FUNCTION PANEL
ALT: 21 IU/L (ref 0–44)
AST: 40 IU/L (ref 0–40)
Albumin: 3.8 g/dL (ref 3.7–4.7)
Alkaline Phosphatase: 187 IU/L — ABNORMAL HIGH (ref 48–121)
Bilirubin Total: 3.4 mg/dL — ABNORMAL HIGH (ref 0.0–1.2)
Bilirubin, Direct: 1.73 mg/dL — ABNORMAL HIGH (ref 0.00–0.40)
Total Protein: 8 g/dL (ref 6.0–8.5)

## 2019-11-19 LAB — ANA: Anti Nuclear Antibody (ANA): NEGATIVE

## 2019-11-19 LAB — HEPATITIS C ANTIBODY: Hep C Virus Ab: <0.1 s/co ratio (ref 0.0–0.9)

## 2019-11-19 LAB — HEPATITIS B SURFACE ANTIBODY,QUALITATIVE: Hep B Surface Ab, Qual: NONREACTIVE

## 2019-11-19 LAB — HEPATITIS B CORE ANTIBODY, TOTAL: Hep B Core Total Ab: NEGATIVE

## 2019-11-19 LAB — FERRITIN: Ferritin: 503 ng/mL — ABNORMAL HIGH (ref 30–400)

## 2019-11-19 LAB — MITOCHONDRIAL/SMOOTH MUSCLE AB PNL
Mitochondrial Ab: <20 Units (ref 0.0–20.0)
Smooth Muscle Ab: 12 Units (ref 0–19)

## 2019-11-19 LAB — HEPATITIS B SURFACE ANTIGEN: Hepatitis B Surface Ag: NEGATIVE

## 2019-11-24 ENCOUNTER — Ambulatory Visit (HOSPITAL_COMMUNITY)
Admission: RE | Admit: 2019-11-24 | Discharge: 2019-11-24 | Disposition: A | Discharge status: Home / Self Care | Payer: Medicare HMO | Source: Ambulatory Visit | Attending: Gastroenterology | Admitting: Gastroenterology

## 2019-11-24 ENCOUNTER — Other Ambulatory Visit: Payer: Self-pay

## 2019-11-24 DIAGNOSIS — K8020 Calculus of gallbladder without cholecystitis without obstruction: Secondary | ICD-10-CM | POA: Diagnosis not present

## 2019-11-24 DIAGNOSIS — R7989 Other specified abnormal findings of blood chemistry: Secondary | ICD-10-CM

## 2019-11-24 DIAGNOSIS — K760 Fatty (change of) liver, not elsewhere classified: Secondary | ICD-10-CM | POA: Diagnosis not present

## 2019-12-01 ENCOUNTER — Other Ambulatory Visit: Payer: Self-pay

## 2019-12-01 ENCOUNTER — Other Ambulatory Visit (INDEPENDENT_AMBULATORY_CARE_PROVIDER_SITE_OTHER): Payer: Self-pay | Admitting: Gastroenterology

## 2019-12-01 ENCOUNTER — Encounter: Payer: Self-pay | Admitting: Internal Medicine

## 2019-12-01 ENCOUNTER — Ambulatory Visit (INDEPENDENT_AMBULATORY_CARE_PROVIDER_SITE_OTHER): Payer: Medicare HMO | Admitting: Internal Medicine

## 2019-12-01 VITALS — BP 118/62 | HR 92 | Ht 72.0 in | Wt 222.8 lb

## 2019-12-01 DIAGNOSIS — I442 Atrioventricular block, complete: Secondary | ICD-10-CM

## 2019-12-01 DIAGNOSIS — I5042 Chronic combined systolic (congestive) and diastolic (congestive) heart failure: Secondary | ICD-10-CM | POA: Diagnosis not present

## 2019-12-01 DIAGNOSIS — R7989 Other specified abnormal findings of blood chemistry: Secondary | ICD-10-CM

## 2019-12-01 DIAGNOSIS — I4891 Unspecified atrial fibrillation: Secondary | ICD-10-CM | POA: Diagnosis not present

## 2019-12-01 MED ORDER — CARVEDILOL 25 MG PO TABS: 25.0000 mg | ORAL_TABLET | Freq: Two times a day (BID) | ORAL | 3 refills | Status: DC

## 2019-12-01 NOTE — Progress Notes (Signed)
HPI Mr. Edelen returns today for followup. He is a pleasant 74 yo man with a h/o chronic systolic heart failure, on medical therapy, CHB, s/p PPM with atrial lead dislodgement, PAF on amiodarone, who has had his meds uptitrated. In the interim, on medical therapy his breathing is improved. Class 2.  No Known Allergies   Current Outpatient Medications  Medication Sig Dispense Refill  . ACCU-CHEK AVIVA PLUS test strip TEST BLOOD SUGAR TWICE DAILY BEFORE BREAKFAST AND AT BEDTIME 200 strip 1  . benzonatate (TESSALON) 100 MG capsule TAKE 1 CAPSULE THREE TIMES DAILY AS NEEDED FOR COUGH (Patient taking differently: Take 100 mg by mouth 3 (three) times daily as needed for cough. ) 90 capsule 1  . carvedilol (COREG) 25 MG tablet TAKE 1 TABLET (25 MG TOTAL) BY MOUTH 2 (TWO) TIMES DAILY WITH A MEAL. 180 tablet 3  . ELIQUIS 5 MG TABS tablet TAKE 1 TABLET TWICE DAILY (Patient taking differently: Take 5 mg by mouth 2 (two) times daily. ) 180 tablet 3  . HYDROcodone-acetaminophen (NORCO/VICODIN) 5-325 MG tablet Take one tablet po every 4 hrs prn pain (Patient taking differently: Take 1 tablet by mouth every 4 (four) hours as needed for moderate pain. ) 28 tablet 0  . hydrocortisone 2.5 % cream Apply BID to affected area (Patient taking differently: Apply 1 application topically 2 (two) times daily as needed (skin irritation.). ) 60 g 3  . insulin degludec (TRESIBA FLEXTOUCH) 200 UNIT/ML FlexTouch Pen Inject 60 Units into the skin at bedtime. 30 mL 1  . Insulin Pen Needle (BD PEN NEEDLE NANO U/F) 32G X 4 MM MISC 1 each by Does not apply route 4 (four) times daily. 150 each 5  . metFORMIN (GLUCOPHAGE) 500 MG tablet TAKE 1 TABLET TWICE DAILY WITH MEALS 180 tablet 0  . metolazone (ZAROXOLYN) 2.5 MG tablet Take 1 tablet (2.5 mg total) by mouth 2 (two) times a week. Monday and Friday (Patient taking differently: Take 2.5 mg by mouth every other day. ) 15 tablet 1  . Multiple Vitamin (MULTIVITAMIN WITH  MINERALS) TABS tablet Take 1 tablet by mouth daily. One A Day for Men    . potassium chloride SA (KLOR-CON) 20 MEQ tablet Take 60 meq in the am, at lunch, and in the evening (Patient taking differently: Take 60 mEq by mouth in the morning and at bedtime. ) 270 tablet 6  . rosuvastatin (CRESTOR) 20 MG tablet Take 1 tablet (20 mg total) by mouth daily. 90 tablet 1  . sildenafil (VIAGRA) 50 MG tablet Take 1 tablet (50 mg total) by mouth daily as needed for erectile dysfunction. 30 tablet 3  . spironolactone (ALDACTONE) 25 MG tablet Take 1 tablet (25 mg total) by mouth daily. 30 tablet 3  . torsemide (DEMADEX) 20 MG tablet Take 2 tablets (40 mg total) by mouth 2 (two) times daily. 120 tablet 3  . TRULICITY 1.5 MW/4.1LK SOPN INJECT 1.5MG (1 PEN) SUBCUTANEOUSLY EVERY WEEK (Patient taking differently: Inject 1.5 mg into the skin every Sunday. ) 6 mL 0  . Zinc 50 MG TABS Take 50 mg by mouth daily.     No current facility-administered medications for this visit.     Past Medical History:  Diagnosis Date  . Atrial flutter ([place]) 12/2010   Admitted with symptomatic bradycardia (HR 40s), atrial flutter with slow ventricular response 12/2010 + volume overload; AV nodal agents d/c'd and Pradaxa started; RFA in 01/2011  . CHF (congestive heart failure) ([place])   .  Chronic combined systolic and diastolic heart failure ([place])    a. echo 01/06/11: mild LVH, EF 65-70%, mod to severe LAE, mild RVE, mild RAE, PASP 32;   TEE 10/12: EF 45-50% b. EF 45 to 50% by echo in 07/2018  . Class 2 severe obesity due to excess calories with serious comorbidity and body mass index (BMI) of 37.0 to 37.9 in adult ([place]) 07/17/2017  . Diabetes mellitus    non insulin dependant  . Hyperlipidemia   . Hypertension   . Osteoarthritis   . Presence of permanent cardiac pacemaker   . PSVT (paroxysmal supraventricular tachycardia) ([facility])    Possibly atrial flutter    ROS:   All systems reviewed and negative except as noted in the  HPI.   Past Surgical History:  Procedure Laterality Date  . ATRIAL FLUTTER ABLATION N/A 02/07/2011   Procedure: ATRIAL FLUTTER ABLATION;  Surgeon: Evans Lance, MD;  Location: [facility] CATH LAB;  Service: Cardiovascular;  Laterality: N/A;  . CARDIAC ELECTROPHYSIOLOGY STUDY AND ABLATION  02/07/11  . CARDIOVERSION N/A 03/23/2016   Procedure: CARDIOVERSION;  Surgeon: Evans Lance, MD;  Location: [place];  Service: Cardiovascular;  Laterality: N/A;  . COLONOSCOPY N/A 04/17/2017   Procedure: COLONOSCOPY;  Surgeon: Rogene Houston, MD;  [facility];  Service: Endoscopy;  Laterality: N/A;  830  . EP IMPLANTABLE DEVICE N/A 08/29/2015   Procedure: Pacemaker Implant;  Surgeon: Evans Lance, MD;  Location: [place] CV LAB;  Service: Cardiovascular;  Laterality: N/A;  . EP IMPLANTABLE DEVICE N/A 12/07/2015   Procedure: PPM Lead Revision/Repair;  Surgeon: Evans Lance, MD;  Location: [facility] CV LAB;  Service: Cardiovascular;  Laterality: N/A;  . KNEE ARTHROSCOPY  2005   left  . LUMBAR SPINE SURGERY     "I've had 6 ORs 1972 thru 2004"  . POLYPECTOMY  04/17/2017   Procedure: POLYPECTOMY;  Surgeon: Rogene Houston, MD;  [facility];  Service: Endoscopy;;  transverse colon x3;  . TEE WITHOUT CARDIOVERSION N/A 11/05/2019   Procedure: TRANSESOPHAGEAL ECHOCARDIOGRAM (TEE) WITH PROPOFOL;  Surgeon: Arnoldo Lenis, MD;  [facility];  Service: Endoscopy;  Laterality: N/A;     Family History  Problem Relation Age of Onset  . Heart attack Mother   . Hypertension Mother   . Heart attack Father   . Hypertension Father   . Heart attack Brother   . Colon cancer Neg Hx      Social History   Socioeconomic History  . Marital status: Married    Spouse name: Not on file  . Number of children: Not on file  . Years of education: Not on file  . Highest education level: Not on file  Occupational History  . Occupation: Immunologist:  RETIRED  Tobacco Use  . Smoking status: Former Smoker    Packs/day: 1.00    Years: 10.00    Pack years: 10.00    Types: Cigarettes    Quit date: 03/26/1986    Years since quitting: 33.7  . Smokeless tobacco: Never Used  . Tobacco comment: "stopped cigarette  smoking 1988"  Vaping Use  . Vaping Use: Never used  Substance and Sexual Activity  . Alcohol use: Never    Alcohol/week: 0.0 standard drinks    Comment: "quit alcohol ~ 2007"  . Drug use: No  . Sexual activity: Yes    Partners: Female  Other Topics Concern  . Not on file  Social History Narrative  . Not on file   Social Determinants of Health   Financial Resource Strain:   . Difficulty of Paying Living Expenses: Not on file  Food Insecurity:   . Worried About Charity fundraiser in the Last Year: Not on file  . Ran Out of Food in the Last Year: Not on file  Transportation Needs:   . Lack of Transportation (Medical): Not on file  . Lack of Transportation (Non-Medical): Not on file  Physical Activity: Unknown  . Days of Exercise per Week: 3 days  . Minutes of Exercise per Session: Not on file  Stress:   . Feeling of Stress : Not on file  Social Connections:   . Frequency of Communication with Friends and Family: Not on file  . Frequency of Social Gatherings with Friends and Family: Not on file  . Attends Religious Services: Not on file  . Active Member of Clubs or Organizations: Not on file  . Attends Archivist Meetings: Not on file  . Marital Status: Not on file  Intimate Partner Violence:   . Fear of Current or Ex-Partner: Not on file  . Emotionally Abused: Not on file  . Physically Abused: Not on file  . Sexually Abused: Not on file     BP 118/62 (BP Location: Left Arm, Patient Position: Sitting, Cuff Size: Normal)   Pulse 92   Ht 6' (1.829 m)   Wt 222 lb 12.8 oz (101.1 kg)   SpO2 99%   BMI 30.22 kg/m   Physical Exam:  Well appearing 74 yo man, NAD HEENT: Unremarkable Neck:  No JVD, no  thyromegally Lymphatics:  No adenopathy Back:  No CVA tenderness Lungs:  Clear with no wheezes, rales, or rhonchi: Well-healed pacemaker incision HEART:  Regular rate rhythm, no murmurs, no rubs, no clicks Abd:  soft, positive bowel sounds, no organomegally, no rebound, no guarding Ext:  2 plus pulses, no edema, no cyanosis, no clubbing Skin:  No rashes no nodules Neuro:  CN II through XII intact, motor grossly intact  DEVICE  Normal device function.  See PaceArt for details.   Assess/Plan: 1. Chronic systolic heart failure - he has class 2 symptoms. We discussed biv Upgrade. For now he will undergo watchful waiting.  2. CHB - he is asymptomatic, s/p PPM insertion. 3. HTN - he is encouraged to avoid salty foods.  4. PPM - his medtronic device is working normally.  Salome Spotted.

## 2019-12-01 NOTE — Patient Instructions (Signed)
Medication Instructions:  Your physician recommends that you continue on your current medications as directed. Please refer to the Current Medication list given to you today.  *If you need a refill on your cardiac medications before your next appointment, please call your pharmacy*   Lab Work: None   If you have labs (blood work) drawn today and your tests are completely normal, you will receive your results only by: Marland Kitchen MyChart Message (if you have MyChart) OR . A paper copy in the mail If you have any lab test that is abnormal or we need to change your treatment, we will call you to review the results.   Testing/Procedures: NONE    Follow-Up: At [facility], you and your health needs are our priority.  As part of our continuing mission to provide you with exceptional heart care, we have created designated Provider Care Teams.  These Care Teams include your primary Cardiologist (physician) and Advanced Practice Providers (APPs -  Physician Assistants and Nurse Practitioners) who all work together to provide you with the care you need, when you need it.  We recommend signing up for the patient portal called "MyChart".  Sign up information is provided on this After Visit Summary.  MyChart is used to connect with patients for Virtual Visits (Telemedicine).  Patients are able to view lab/test results, encounter notes, upcoming appointments, etc.  Non-urgent messages can be sent to your provider as well.   To learn more about what you can do with MyChart, go to [email or web address].    Your next appointment:   6 month(s)  The format for your next appointment:   In Person  Provider:   [name], MD   Other Instructions Thank you for choosing [facility]!

## 2019-12-02 DIAGNOSIS — R7989 Other specified abnormal findings of blood chemistry: Secondary | ICD-10-CM | POA: Diagnosis not present

## 2019-12-02 NOTE — Progress Notes (Signed)
Order printed and faxed to [facility].

## 2019-12-03 ENCOUNTER — Other Ambulatory Visit (INDEPENDENT_AMBULATORY_CARE_PROVIDER_SITE_OTHER): Payer: Self-pay | Admitting: Gastroenterology

## 2019-12-03 DIAGNOSIS — K8020 Calculus of gallbladder without cholecystitis without obstruction: Secondary | ICD-10-CM

## 2019-12-03 LAB — HEPATIC FUNCTION PANEL
ALT: 23 IU/L (ref 0–44)
AST: 39 IU/L (ref 0–40)
Albumin: 4.1 g/dL (ref 3.7–4.7)
Alkaline Phosphatase: 197 IU/L — ABNORMAL HIGH (ref 48–121)
Bilirubin Total: 3.2 mg/dL — ABNORMAL HIGH (ref 0.0–1.2)
Bilirubin, Direct: 1.55 mg/dL — ABNORMAL HIGH (ref 0.00–0.40)
Total Protein: 8.2 g/dL (ref 6.0–8.5)

## 2019-12-03 NOTE — Progress Notes (Unsigned)
MRCP order In chart for persistently elevated LFTS.   Addendum - patient has pacemaker, will re-discuss w/ Dr Laural Golden.

## 2019-12-04 ENCOUNTER — Ambulatory Visit: Payer: Medicare HMO | Admitting: Family Medicine

## 2019-12-04 ENCOUNTER — Other Ambulatory Visit (INDEPENDENT_AMBULATORY_CARE_PROVIDER_SITE_OTHER): Payer: Self-pay | Admitting: *Deleted

## 2019-12-08 ENCOUNTER — Other Ambulatory Visit (INDEPENDENT_AMBULATORY_CARE_PROVIDER_SITE_OTHER): Payer: Self-pay | Admitting: Gastroenterology

## 2019-12-08 DIAGNOSIS — R7989 Other specified abnormal findings of blood chemistry: Secondary | ICD-10-CM

## 2019-12-08 NOTE — Progress Notes (Signed)
Unable to have MRCP. Will order CT a/p, discussed w/ Dr Laural Golden - plan to draw istat Cr and give low dose contrast if able. Thanks.

## 2019-12-09 ENCOUNTER — Other Ambulatory Visit (INDEPENDENT_AMBULATORY_CARE_PROVIDER_SITE_OTHER): Payer: Self-pay | Admitting: *Deleted

## 2019-12-09 ENCOUNTER — Telehealth (INDEPENDENT_AMBULATORY_CARE_PROVIDER_SITE_OTHER): Payer: Self-pay | Admitting: Gastroenterology

## 2019-12-09 DIAGNOSIS — Z01812 Encounter for preprocedural laboratory examination: Secondary | ICD-10-CM

## 2019-12-09 NOTE — Telephone Encounter (Signed)
Case reviewed with Dr. Laural Golden.    In summary patient has had persistently elevating alkaline phosphate and bilirubin.  He is asymptomatic and has no symptoms of the gallstones visualized on his ultrasound.  Given persistently elevated liver enzymes initially planned for MRCP.  He has a pacemaker so unable to have MRCP.  We will transition to a CT a/p, he does have chronic kidney disease and ideally would like to have low-dose contrast if creatinine allows-radiology to follow protocol.

## 2019-12-09 NOTE — H&P (View-Only) (Signed)
Cardiology Office Note  Date: 12/10/2019   ID: LAFE CLERK, DOB 1946-02-10, MRN [id number]  PCP:  Erven Colla, DO  Cardiologist:  [name], MD Electrophysiologist:  None   Chief Complaint: Follow-up acute on chronic combined systolic and diastolic heart failure  History of Present Illness: John [name] is a 74 y.o. male with a history of acute on chronic combined systolic and diastolic heart failure, atrial flutter, PAF, CHB, HTN, aortic stenosis, abnormal liver test.  Echo 08/13/2018: EF 45 to 50%, G2 DD, normal RV, mild aortic stenosis.  Some recent edema and SOB/DOE, mild orthopnea.  Weight around 243 pounds.  Taking torsemide 40 mg a.m., 20 p.m., metolazone as needed approximately once a month.  Atrial flutter status post ablation 2012.  No recent palpitations.  PAF on Eliquis for stroke prevention.  Status post pacemaker placement 2017 for complete heart block.  Normal device check January 2021.  Compliant with hypertension medications.  Last visit with Dr. Harl Bowie on 09/30/2019.  He had signs of fluid overload on exam.  Torsemide was increased to 40 mg p.o. twice daily.  Advised to continue metolazone sparingly.  He favored titrating his loop diuretic to control his edema and limit the metolazone use.  He had no symptoms with atrial fibrillation EKG showed V paced rhythm.  He was a continuing Eliquis.  Patient had normal device function on last pacemaker check.  Continuing to monitor.  Aortic stenosis showed mixed parameters based on the previous years echo.  Moderate to severe by AVA and DI, mild gradient, reported stroke volume index low.  Unclear if paradoxical low flow aortic stenosis as LVEF had not been bad.  Visually  aortic stenosis looks probably moderate per Dr. Harl Bowie.  Repeat echo was ordered.   He was here last follow-up status post recent echocardiogram.  He stated he was feeling much better since he had lost the fluid.  He was taking torsemide 40 mg p.o.  twice daily and metolazone 2.5 mg p.o. 3 times a week.  He lost approximately 14 pounds from weight October 14, 2019.  At that time he weighed 242 pounds.  Current weight today is 230 pounds pounds.    Today he states his breathing is much better and his swelling in his lower extremities is better but still has chronic dyspnea which is stable.  Denies any significant anginal or exertional symptoms, palpitations or arrhythmias.  Denies any PND, orthopnea, CVA or TIA-like symptoms, orthostatic symptoms. His blood pressure is 100/70 today.  Has some minor bilateral lower extremity edema. He has had some recent issues with elevated liver function test.  Recent abdominal ultrasound showed fatty liver disease, cholelithiasis without cholecystitis.  Apparently has a pending ERCP scheduled  to check for a ductal stone.  He is following with [facility] gastroenterology Dr. Laural Golden.  States he continues to have some chronic dyspnea on exertion.  States he was splitting wood the other day and had some increased shortness of breath more than usual but nothing he would describe as significant.  Had recent visit with Dr. Lovena Le.  For device check and recheck of atrial fibrillation.  Apparently he has had atrial lead dislodgment.  He continues on amiodarone for his PAF.  He was having class II NYHA symptoms.  Upgrade to BiV pacer was discussed.  For now he will undergo watchful waiting.  His Medtronic device is working normally.    Past Medical History:  Diagnosis Date  . Atrial flutter ([place]) 12/2010  Admitted with symptomatic bradycardia (HR 40s), atrial flutter with slow ventricular response 12/2010 + volume overload; AV nodal agents d/c'd and Pradaxa started; RFA in 01/2011  . CHF (congestive heart failure) ([place])   . Chronic combined systolic and diastolic heart failure ([place])    a. echo 01/06/11: mild LVH, EF 65-70%, mod to severe LAE, mild RVE, mild RAE, PASP 32;   TEE 10/12: EF 45-50% b. EF 45 to 50% by echo in  07/2018  . Class 2 severe obesity due to excess calories with serious comorbidity and body mass index (BMI) of 37.0 to 37.9 in adult ([place]) 07/17/2017  . Diabetes mellitus    non insulin dependant  . Hyperlipidemia   . Hypertension   . Osteoarthritis   . Presence of permanent cardiac pacemaker   . PSVT (paroxysmal supraventricular tachycardia) (H[facility])    Possibly atrial flutter    Past Surgical History:  Procedure Laterality Date  . ATRIAL FLUTTER ABLATION N/A 02/07/2011   Procedure: ATRIAL FLUTTER ABLATION;  Surgeon: Evans Lance, MD;  Location: [facility] CATH LAB;  Service: Cardiovascular;  Laterality: N/A;  . CARDIAC ELECTROPHYSIOLOGY STUDY AND ABLATION  02/07/11  . CARDIOVERSION N/A 03/23/2016   Procedure: CARDIOVERSION;  Surgeon: Evans Lance, MD;  Location: [facility];  Service: Cardiovascular;  Laterality: N/A;  . COLONOSCOPY N/A 04/17/2017   Procedure: COLONOSCOPY;  Surgeon: Rogene Houston, MD;  [facility];  Service: Endoscopy;  Laterality: N/A;  830  . EP IMPLANTABLE DEVICE N/A 08/29/2015   Procedure: Pacemaker Implant;  Surgeon: Evans Lance, MD;  Location: [facility] CV LAB;  Service: Cardiovascular;  Laterality: N/A;  . EP IMPLANTABLE DEVICE N/A 12/07/2015   Procedure: PPM Lead Revision/Repair;  Surgeon: Evans Lance, MD;  Location: [facility] CV LAB;  Service: Cardiovascular;  Laterality: N/A;  . KNEE ARTHROSCOPY  2005   left  . LUMBAR SPINE SURGERY     "I've had 6 ORs 1972 thru 2004"  . POLYPECTOMY  04/17/2017   Procedure: POLYPECTOMY;  Surgeon: Rogene Houston, MD;  [facility];  Service: Endoscopy;;  transverse colon x3;  . TEE WITHOUT CARDIOVERSION N/A 11/05/2019   Procedure: TRANSESOPHAGEAL ECHOCARDIOGRAM (TEE) WITH PROPOFOL;  Surgeon: Arnoldo Lenis, MD;  [facility];  Service: Endoscopy;  Laterality: N/A;    Current Outpatient Medications  Medication Sig Dispense Refill  . ACCU-CHEK AVIVA PLUS test strip TEST BLOOD SUGAR  TWICE DAILY BEFORE BREAKFAST AND AT BEDTIME 200 strip 1  . benzonatate (TESSALON) 100 MG capsule TAKE 1 CAPSULE THREE TIMES DAILY AS NEEDED FOR COUGH (Patient taking differently: Take 100 mg by mouth 3 (three) times daily as needed for cough. ) 90 capsule 1  . carvedilol (COREG) 25 MG tablet Take 1 tablet (25 mg total) by mouth 2 (two) times daily with a meal. 180 tablet 3  . ELIQUIS 5 MG TABS tablet TAKE 1 TABLET TWICE DAILY (Patient taking differently: Take 5 mg by mouth 2 (two) times daily. ) 180 tablet 3  . HYDROcodone-acetaminophen (NORCO/VICODIN) 5-325 MG tablet Take one tablet po every 4 hrs prn pain (Patient taking differently: Take 1 tablet by mouth every 4 (four) hours as needed for moderate pain. ) 28 tablet 0  . hydrocortisone 2.5 % cream Apply BID to affected area (Patient taking differently: Apply 1 application topically 2 (two) times daily as needed (skin irritation.). ) 60 g 3  . insulin degludec (TRESIBA FLEXTOUCH) 200 UNIT/ML FlexTouch Pen Inject 60 Units into the  skin at bedtime. 30 mL 1  . Insulin Pen Needle (BD PEN NEEDLE NANO U/F) 32G X 4 MM MISC 1 each by Does not apply route 4 (four) times daily. 150 each 5  . metFORMIN (GLUCOPHAGE) 500 MG tablet TAKE 1 TABLET TWICE DAILY WITH MEALS 180 tablet 0  . metolazone (ZAROXOLYN) 2.5 MG tablet Take 1 tablet (2.5 mg total) by mouth 2 (two) times a week. Monday and Friday (Patient taking differently: Take 2.5 mg by mouth every other day. ) 15 tablet 1  . Multiple Vitamin (MULTIVITAMIN WITH MINERALS) TABS tablet Take 1 tablet by mouth daily. One A Day for Men    . potassium chloride SA (KLOR-CON) 20 MEQ tablet Take 60 meq in the am, at lunch, and in the evening (Patient taking differently: Take 60 mEq by mouth in the morning and at bedtime. ) 270 tablet 6  . rosuvastatin (CRESTOR) 20 MG tablet Take 1 tablet (20 mg total) by mouth daily. 90 tablet 1  . sildenafil (VIAGRA) 50 MG tablet Take 1 tablet (50 mg total) by mouth daily as needed for  erectile dysfunction. 30 tablet 3  . spironolactone (ALDACTONE) 25 MG tablet Take 1 tablet (25 mg total) by mouth daily. 30 tablet 3  . torsemide (DEMADEX) 20 MG tablet Take 2 tablets (40 mg total) by mouth 2 (two) times daily. 120 tablet 3  . TRULICITY 1.5 LJ/4.4BE SOPN INJECT 1.5MG (1 PEN) SUBCUTANEOUSLY EVERY WEEK (Patient taking differently: Inject 1.5 mg into the skin every Sunday. ) 6 mL 0  . Zinc 50 MG TABS Take 50 mg by mouth daily.     No current facility-administered medications for this visit.   Allergies:  Patient has no known allergies.   Social History: The patient  reports that he quit smoking about 33 years ago. His smoking use included cigarettes. He has a 10.00 pack-year smoking history. He has never used smokeless tobacco. He reports that he does not drink alcohol and does not use drugs.   Family History: The patient's family history includes Heart attack in his brother, father, and mother; Hypertension in his father and mother.   ROS:  Please see the history of present illness. Otherwise, complete review of systems is positive for none.  All other systems are reviewed and negative.   Physical Exam: VS:  BP 100/70   Pulse 80   Ht 6' (1.829 m)   Wt 230 lb 6.4 oz (104.5 kg)   SpO2 98%   BMI 31.25 kg/m , BMI Body mass index is 31.25 kg/m.  Wt Readings from Last 3 Encounters:  12/10/19 230 lb 6.4 oz (104.5 kg)  12/01/19 222 lb 12.8 oz (101.1 kg)  11/16/19 230 lb 3.2 oz (104.4 kg)    General: Patient appears comfortable at rest. Neck: Supple, no elevated JVP or carotid bruits, no thyromegaly. Lungs: Clear to auscultation, nonlabored breathing at rest. Cardiac: Regular rate and rhythm, no S3 or significant systolic murmur, no pericardial rub. Extremities: Mild non-pitting edema, distal pulses 2+. Skin: Warm and dry. Musculoskeletal: No kyphosis. Neuropsychiatric: Alert and oriented x3, affect grossly appropriate.  ECG:  EKG September 30, 2019 electronic ventricular  pacemaker rate of 80.  Recent Labwork: 08/12/2019: Hemoglobin 15.0; Platelets 93 10/02/2019: Magnesium 2.4 11/03/2019: BUN 39; Creatinine, Ser 1.60; Potassium 3.5; Sodium 131 12/02/2019: ALT 23; AST 39     Component Value Date/Time   CHOL 178 08/12/2019 0809   CHOL 142 01/17/2018 0835   TRIG 100 08/12/2019 0809   HDL  51 08/12/2019 0809   HDL 53 01/17/2018 0835   CHOLHDL 3.5 08/12/2019 0809   VLDL 20 08/12/2019 0809   LDLCALC 107 (H) 08/12/2019 0809   LDLCALC 71 01/17/2018 0835    Other Studies Reviewed Today:  TEE 11/06/2019  1. Left ventricular ejection fraction, by estimation, is 35 to 40%. The left ventricle has moderately decreased function. The left ventricle demonstrates global hypokinesis. 2. Right ventricular systolic function is mildly reduced. The right ventricular size is mildly enlarged. 3. Left atrial size was moderately dilated. No left atrial/left atrial appendage thrombus was detected. The LAA emptying velocity was 30 cm/s. 4. Right atrial size was moderately dilated. 5. The mitral valve is abnormal. Moderate mitral valve regurgitation. No evidence of mitral stenosis. 6. Tricuspid valve regurgitation is moderate. 7. The aortic valve is abnormal. Aortic valve regurgitation is not visualized.    Echocardiogram 10/14/2019 1. Left ventricular ejection fraction, by estimation, is 30 to 35%. The left ventricle has moderately decreased function. The left ventricle demonstrates global hypokinesis with abnormal septal motion possibly consistent with pacing. The left ventricular internal cavity size was mildly dilated. Left ventricular diastolic parameters are indeterminate. 2. Right ventricular systolic function is mildly reduced. The right ventricular size is normal. There is mildly elevated pulmonary artery systolic pressure. The estimated right ventricular systolic pressure is 54.2 mmHg. 3. Left atrial size was moderately dilated. 4. Right atrial size was moderately  dilated. 5. The mitral valve is abnormal, mildly thickened and calcified. Moderate to severe mitral valve regurgitation, eccentric and posteriorly directed. Not clear that PISA calculations best define degree of regurgitation. 6. The aortic valve has an indeterminant number of cusps and is severely calcified. Aortic valve mean gradient measures 15.5 mmHg. Cannot exclude severe low gradient aortic stenosis with dimentionless index of 0.23. 7. The inferior vena cava is dilated in size with <50% respiratory variability, suggesting right atrial pressure of 15 mmHg.    Echocardiogram: 07/2018 IMPRESSIONS 1. The left ventricle has mildly reduced systolic function, with an ejection fraction of 45-50%. The cavity size was mild to moderately dilated. There is mildly increased left ventricular wall thickness. Left ventricular diastolic Doppler parameters are consistent with pseudonormalization. Left ventrical global hypokinesis without regional wall motion abnormalities. 2. The right ventricle has normal systolic function. The cavity was normal. There is no increase in right ventricular wall thickness. 3. Left atrial size was severely dilated. 4. The aortic valve is tricuspid. Mild thickening of the aortic valve. Moderate calcification of the aortic valve. Mild stenosis of the aortic valve. 5. The ascending aorta is normal in size and structure. 6. The inferior vena cava was dilated in size with <50% respiratory variability.   Assessment and Plan:    1. Acute on chronic diastolic heart failure ([place]) Patient's weight today is 230 up from 222.  He states his only issue is dyspnea on exertion.  States he was working on wood splitting the other day and had some increased shortness of breath.  His swelling is much better. Recent TEE on 11/06/2019 showed EF 35 to 40%, global hypokinesis, moderately dilated LA, moderately dilated RA, moderate mitral valve regurgitation, tricuspid valve  regurgitation moderate. A subsequent catheterization was planned after TEE check for ischemic heart disease contributing to low EF.  Continue carvedilol 25 mg p.o. twice daily.  Torsemide 40 mg p.o. twice daily, spironolactone 20 mg daily, metolazone 2.5 mg p.o. twice weekly.  2. Atrial fibrillation, unspecified type [facility]) Patient has a history of permanent atrial fibrillation which underlies the  paced rhythm.  Continue Eliquis 5 mg p.o. twice daily.  Continue carvedilol 25 mg p.o. twice daily.  3. Complete heart block [employer]) Patient has a pacemaker in place and follows with Dr. Lovena Le EP.  Recent visit with Dr. Lovena Le on 12/01/2019.  He was having class II chronic systolic heart failure symptoms.  Discussion of BiV upgrade took place.  For now he will undergo watchful waiting.  He was asymptomatic status post PPM insertion.  He was encouraged to avoid salty foods.  His Medtronic device is working properly.    4. Aortic valve stenosis, etiology of cardiac valve disease unspecified Recent TTE showed possible evidence of low gradient severe aortic stenosis.   Recent TEE on 11/06/2019 showed EF 35 to 40%, global hypokinesis, moderately dilated LA, moderately dilated RA, moderate mitral valve regurgitation, tricuspid valve regurgitation moderate.    Medication Adjustments/Labs and Tests Ordered: Current medicines are reviewed at length with the patient today.  Concerns regarding medicines are outlined above.   Disposition: Follow-up with Dr. Harl Bowie or APP 6 months  Signed, Levell July, NP 12/10/2019 9:21 AM    [facility] [place], [place], [place] [address] Phone: [phone]; Fax: [phone]

## 2019-12-09 NOTE — Progress Notes (Signed)
Cardiology Office Note  Date: 12/10/2019   ID: LENIS NETTLETON, DOB 02-Sep-1945, MRN [id number]  PCP:  Erven Colla, DO  Cardiologist:  [name], MD Electrophysiologist:  None   Chief Complaint: Follow-up acute on chronic combined systolic and diastolic heart failure  History of Present Illness: John [name] is a 74 y.o. male with a history of acute on chronic combined systolic and diastolic heart failure, atrial flutter, PAF, CHB, HTN, aortic stenosis, abnormal liver test.  Echo 08/13/2018: EF 45 to 50%, G2 DD, normal RV, mild aortic stenosis.  Some recent edema and SOB/DOE, mild orthopnea.  Weight around 243 pounds.  Taking torsemide 40 mg a.m., 20 p.m., metolazone as needed approximately once a month.  Atrial flutter status post ablation 2012.  No recent palpitations.  PAF on Eliquis for stroke prevention.  Status post pacemaker placement 2017 for complete heart block.  Normal device check January 2021.  Compliant with hypertension medications.  Last visit with Dr. Harl Bowie on 09/30/2019.  He had signs of fluid overload on exam.  Torsemide was increased to 40 mg p.o. twice daily.  Advised to continue metolazone sparingly.  He favored titrating his loop diuretic to control his edema and limit the metolazone use.  He had no symptoms with atrial fibrillation EKG showed V paced rhythm.  He was a continuing Eliquis.  Patient had normal device function on last pacemaker check.  Continuing to monitor.  Aortic stenosis showed mixed parameters based on the previous years echo.  Moderate to severe by AVA and DI, mild gradient, reported stroke volume index low.  Unclear if paradoxical low flow aortic stenosis as LVEF had not been bad.  Visually  aortic stenosis looks probably moderate per Dr. Harl Bowie.  Repeat echo was ordered.   He was here last follow-up status post recent echocardiogram.  He stated he was feeling much better since he had lost the fluid.  He was taking torsemide 40 mg p.o.  twice daily and metolazone 2.5 mg p.o. 3 times a week.  He lost approximately 14 pounds from weight October 14, 2019.  At that time he weighed 242 pounds.  Current weight today is 230 pounds pounds.    Today he states his breathing is much better and his swelling in his lower extremities is better but still has chronic dyspnea which is stable.  Denies any significant anginal or exertional symptoms, palpitations or arrhythmias.  Denies any PND, orthopnea, CVA or TIA-like symptoms, orthostatic symptoms. His blood pressure is 100/70 today.  Has some minor bilateral lower extremity edema. He has had some recent issues with elevated liver function test.  Recent abdominal ultrasound showed fatty liver disease, cholelithiasis without cholecystitis.  Apparently has a pending ERCP scheduled  to check for a ductal stone.  He is following with [facility] gastroenterology Dr. Laural Golden.  States he continues to have some chronic dyspnea on exertion.  States he was splitting wood the other day and had some increased shortness of breath more than usual but nothing he would describe as significant.  Had recent visit with Dr. Lovena Le.  For device check and recheck of atrial fibrillation.  Apparently he has had atrial lead dislodgment.  He continues on amiodarone for his PAF.  He was having class II NYHA symptoms.  Upgrade to BiV pacer was discussed.  For now he will undergo watchful waiting.  His Medtronic device is working normally.    Past Medical History:  Diagnosis Date  . Atrial flutter ([place]) 12/2010  Admitted with symptomatic bradycardia (HR 40s), atrial flutter with slow ventricular response 12/2010 + volume overload; AV nodal agents d/c'd and Pradaxa started; RFA in 01/2011  . CHF (congestive heart failure) ([place])   . Chronic combined systolic and diastolic heart failure ([place])    a. echo 01/06/11: mild LVH, EF 65-70%, mod to severe LAE, mild RVE, mild RAE, PASP 32;   TEE 10/12: EF 45-50% b. EF 45 to 50% by echo in  07/2018  . Class 2 severe obesity due to excess calories with serious comorbidity and body mass index (BMI) of 37.0 to 37.9 in adult ([place]) 07/17/2017  . Diabetes mellitus    non insulin dependant  . Hyperlipidemia   . Hypertension   . Osteoarthritis   . Presence of permanent cardiac pacemaker   . PSVT (paroxysmal supraventricular tachycardia) (H[facility])    Possibly atrial flutter    Past Surgical History:  Procedure Laterality Date  . ATRIAL FLUTTER ABLATION N/A 02/07/2011   Procedure: ATRIAL FLUTTER ABLATION;  Surgeon: Evans Lance, MD;  Location[facility] CATH LAB;  Service: Cardiovascular;  Laterality: N/A;  . CARDIAC ELECTROPHYSIOLOGY STUDY AND ABLATION  02/07/11  . CARDIOVERSION N/A 03/23/2016   Procedure: CARDIOVERSION;  Surgeon: Evans Lance, MD;  Location: [place];  Service: Cardiovascular;  Laterality: N/A;  . COLONOSCOPY N/A 04/17/2017   Procedure: COLONOSCOPY;  Surgeon: Rogene Houston, MD;  [facility];  Service: Endoscopy;  Laterality: N/A;  830  . EP IMPLANTABLE DEVICE N/A 08/29/2015   Procedure: Pacemaker Implant;  Surgeon: Evans Lance, MD;  Location: [place] CV LAB;  Service: Cardiovascular;  Laterality: N/A;  . EP IMPLANTABLE DEVICE N/A 12/07/2015   Procedure: PPM Lead Revision/Repair;  Surgeon: Evans Lance, MD;  Location: [place] CV LAB;  Service: Cardiovascular;  Laterality: N/A;  . KNEE ARTHROSCOPY  2005   left  . LUMBAR SPINE SURGERY     "I've had 6 ORs 1972 thru 2004"  . POLYPECTOMY  04/17/2017   Procedure: POLYPECTOMY;  Surgeon: Rogene Houston, MD;  [facility];  Service: Endoscopy;;  transverse colon x3;  . TEE WITHOUT CARDIOVERSION N/A 11/05/2019   Procedure: TRANSESOPHAGEAL ECHOCARDIOGRAM (TEE) WITH PROPOFOL;  Surgeon: Arnoldo Lenis, MD;  [facility];  Service: Endoscopy;  Laterality: N/A;    Current Outpatient Medications  Medication Sig Dispense Refill  . ACCU-CHEK AVIVA PLUS test strip TEST BLOOD SUGAR  TWICE DAILY BEFORE BREAKFAST AND AT BEDTIME 200 strip 1  . benzonatate (TESSALON) 100 MG capsule TAKE 1 CAPSULE THREE TIMES DAILY AS NEEDED FOR COUGH (Patient taking differently: Take 100 mg by mouth 3 (three) times daily as needed for cough. ) 90 capsule 1  . carvedilol (COREG) 25 MG tablet Take 1 tablet (25 mg total) by mouth 2 (two) times daily with a meal. 180 tablet 3  . ELIQUIS 5 MG TABS tablet TAKE 1 TABLET TWICE DAILY (Patient taking differently: Take 5 mg by mouth 2 (two) times daily. ) 180 tablet 3  . HYDROcodone-acetaminophen (NORCO/VICODIN) 5-325 MG tablet Take one tablet po every 4 hrs prn pain (Patient taking differently: Take 1 tablet by mouth every 4 (four) hours as needed for moderate pain. ) 28 tablet 0  . hydrocortisone 2.5 % cream Apply BID to affected area (Patient taking differently: Apply 1 application topically 2 (two) times daily as needed (skin irritation.). ) 60 g 3  . insulin degludec (TRESIBA FLEXTOUCH) 200 UNIT/ML FlexTouch Pen Inject 60 Units into the  skin at bedtime. 30 mL 1  . Insulin Pen Needle (BD PEN NEEDLE NANO U/F) 32G X 4 MM MISC 1 each by Does not apply route 4 (four) times daily. 150 each 5  . metFORMIN (GLUCOPHAGE) 500 MG tablet TAKE 1 TABLET TWICE DAILY WITH MEALS 180 tablet 0  . metolazone (ZAROXOLYN) 2.5 MG tablet Take 1 tablet (2.5 mg total) by mouth 2 (two) times a week. Monday and Friday (Patient taking differently: Take 2.5 mg by mouth every other day. ) 15 tablet 1  . Multiple Vitamin (MULTIVITAMIN WITH MINERALS) TABS tablet Take 1 tablet by mouth daily. One A Day for Men    . potassium chloride SA (KLOR-CON) 20 MEQ tablet Take 60 meq in the am, at lunch, and in the evening (Patient taking differently: Take 60 mEq by mouth in the morning and at bedtime. ) 270 tablet 6  . rosuvastatin (CRESTOR) 20 MG tablet Take 1 tablet (20 mg total) by mouth daily. 90 tablet 1  . sildenafil (VIAGRA) 50 MG tablet Take 1 tablet (50 mg total) by mouth daily as needed for  erectile dysfunction. 30 tablet 3  . spironolactone (ALDACTONE) 25 MG tablet Take 1 tablet (25 mg total) by mouth daily. 30 tablet 3  . torsemide (DEMADEX) 20 MG tablet Take 2 tablets (40 mg total) by mouth 2 (two) times daily. 120 tablet 3  . TRULICITY 1.5 GG/2.6RS SOPN INJECT 1.5MG (1 PEN) SUBCUTANEOUSLY EVERY WEEK (Patient taking differently: Inject 1.5 mg into the skin every Sunday. ) 6 mL 0  . Zinc 50 MG TABS Take 50 mg by mouth daily.     No current facility-administered medications for this visit.   Allergies:  Patient has no known allergies.   Social History: The patient  reports that he quit smoking about 33 years ago. His smoking use included cigarettes. He has a 10.00 pack-year smoking history. He has never used smokeless tobacco. He reports that he does not drink alcohol and does not use drugs.   Family History: The patient's family history includes Heart attack in his brother, father, and mother; Hypertension in his father and mother.   ROS:  Please see the history of present illness. Otherwise, complete review of systems is positive for none.  All other systems are reviewed and negative.   Physical Exam: VS:  BP 100/70   Pulse 80   Ht 6' (1.829 m)   Wt 230 lb 6.4 oz (104.5 kg)   SpO2 98%   BMI 31.25 kg/m , BMI Body mass index is 31.25 kg/m.  Wt Readings from Last 3 Encounters:  12/10/19 230 lb 6.4 oz (104.5 kg)  12/01/19 222 lb 12.8 oz (101.1 kg)  11/16/19 230 lb 3.2 oz (104.4 kg)    General: Patient appears comfortable at rest. Neck: Supple, no elevated JVP or carotid bruits, no thyromegaly. Lungs: Clear to auscultation, nonlabored breathing at rest. Cardiac: Regular rate and rhythm, no S3 or significant systolic murmur, no pericardial rub. Extremities: Mild non-pitting edema, distal pulses 2+. Skin: Warm and dry. Musculoskeletal: No kyphosis. Neuropsychiatric: Alert and oriented x3, affect grossly appropriate.  ECG:  EKG September 30, 2019 electronic ventricular  pacemaker rate of 80.  Recent Labwork: 08/12/2019: Hemoglobin 15.0; Platelets 93 10/02/2019: Magnesium 2.4 11/03/2019: BUN 39; Creatinine, Ser 1.60; Potassium 3.5; Sodium 131 12/02/2019: ALT 23; AST 39     Component Value Date/Time   CHOL 178 08/12/2019 0809   CHOL 142 01/17/2018 0835   TRIG 100 08/12/2019 0809   HDL  51 08/12/2019 0809   HDL 53 01/17/2018 0835   CHOLHDL 3.5 08/12/2019 0809   VLDL 20 08/12/2019 0809   LDLCALC 107 (H) 08/12/2019 0809   LDLCALC 71 01/17/2018 0835    Other Studies Reviewed Today:  TEE 11/06/2019  1. Left ventricular ejection fraction, by estimation, is 35 to 40%. The left ventricle has moderately decreased function. The left ventricle demonstrates global hypokinesis. 2. Right ventricular systolic function is mildly reduced. The right ventricular size is mildly enlarged. 3. Left atrial size was moderately dilated. No left atrial/left atrial appendage thrombus was detected. The LAA emptying velocity was 30 cm/s. 4. Right atrial size was moderately dilated. 5. The mitral valve is abnormal. Moderate mitral valve regurgitation. No evidence of mitral stenosis. 6. Tricuspid valve regurgitation is moderate. 7. The aortic valve is abnormal. Aortic valve regurgitation is not visualized.    Echocardiogram 10/14/2019 1. Left ventricular ejection fraction, by estimation, is 30 to 35%. The left ventricle has moderately decreased function. The left ventricle demonstrates global hypokinesis with abnormal septal motion possibly consistent with pacing. The left ventricular internal cavity size was mildly dilated. Left ventricular diastolic parameters are indeterminate. 2. Right ventricular systolic function is mildly reduced. The right ventricular size is normal. There is mildly elevated pulmonary artery systolic pressure. The estimated right ventricular systolic pressure is 62.5 mmHg. 3. Left atrial size was moderately dilated. 4. Right atrial size was moderately  dilated. 5. The mitral valve is abnormal, mildly thickened and calcified. Moderate to severe mitral valve regurgitation, eccentric and posteriorly directed. Not clear that PISA calculations best define degree of regurgitation. 6. The aortic valve has an indeterminant number of cusps and is severely calcified. Aortic valve mean gradient measures 15.5 mmHg. Cannot exclude severe low gradient aortic stenosis with dimentionless index of 0.23. 7. The inferior vena cava is dilated in size with <50% respiratory variability, suggesting right atrial pressure of 15 mmHg.    Echocardiogram: 07/2018 IMPRESSIONS 1. The left ventricle has mildly reduced systolic function, with an ejection fraction of 45-50%. The cavity size was mild to moderately dilated. There is mildly increased left ventricular wall thickness. Left ventricular diastolic Doppler parameters are consistent with pseudonormalization. Left ventrical global hypokinesis without regional wall motion abnormalities. 2. The right ventricle has normal systolic function. The cavity was normal. There is no increase in right ventricular wall thickness. 3. Left atrial size was severely dilated. 4. The aortic valve is tricuspid. Mild thickening of the aortic valve. Moderate calcification of the aortic valve. Mild stenosis of the aortic valve. 5. The ascending aorta is normal in size and structure. 6. The inferior vena cava was dilated in size with <50% respiratory variability.   Assessment and Plan:    1. Acute on chronic diastolic heart failure ([place]) Patient's weight today is 230 up from 222.  He states his only issue is dyspnea on exertion.  States he was working on wood splitting the other day and had some increased shortness of breath.  His swelling is much better. Recent TEE on 11/06/2019 showed EF 35 to 40%, global hypokinesis, moderately dilated LA, moderately dilated RA, moderate mitral valve regurgitation, tricuspid valve  regurgitation moderate. A subsequent catheterization was planned after TEE check for ischemic heart disease contributing to low EF.  Continue carvedilol 25 mg p.o. twice daily.  Torsemide 40 mg p.o. twice daily, spironolactone 20 mg daily, metolazone 2.5 mg p.o. twice weekly.  2. Atrial fibrillation, unspecified type [facility]) Patient has a history of permanent atrial fibrillation which underlies the  paced rhythm.  Continue Eliquis 5 mg p.o. twice daily.  Continue carvedilol 25 mg p.o. twice daily.  3. Complete heart block [facility]) Patient has a pacemaker in place and follows with Dr. Lovena Le EP.  Recent visit with Dr. Lovena Le on 12/01/2019.  He was having class II chronic systolic heart failure symptoms.  Discussion of BiV upgrade took place.  For now he will undergo watchful waiting.  He was asymptomatic status post PPM insertion.  He was encouraged to avoid salty foods.  His Medtronic device is working properly.    4. Aortic valve stenosis, etiology of cardiac valve disease unspecified Recent TTE showed possible evidence of low gradient severe aortic stenosis.   Recent TEE on 11/06/2019 showed EF 35 to 40%, global hypokinesis, moderately dilated LA, moderately dilated RA, moderate mitral valve regurgitation, tricuspid valve regurgitation moderate.    Medication Adjustments/Labs and Tests Ordered: Current medicines are reviewed at length with the patient today.  Concerns regarding medicines are outlined above.   Disposition: Follow-up with Dr. Harl Bowie or APP 6 months  Signed, Levell July, NP 12/10/2019 9:21 AM    [facility] [place], [place], [place] [address] Phone: [phone]; Fax: [phone]

## 2019-12-10 ENCOUNTER — Other Ambulatory Visit: Payer: Self-pay

## 2019-12-10 ENCOUNTER — Encounter: Payer: Self-pay | Admitting: Family Medicine

## 2019-12-10 ENCOUNTER — Ambulatory Visit (INDEPENDENT_AMBULATORY_CARE_PROVIDER_SITE_OTHER): Payer: Medicare HMO | Admitting: Family Medicine

## 2019-12-10 VITALS — BP 100/70 | HR 80 | Ht 72.0 in | Wt 230.4 lb

## 2019-12-10 DIAGNOSIS — I350 Nonrheumatic aortic (valve) stenosis: Secondary | ICD-10-CM | POA: Diagnosis not present

## 2019-12-10 DIAGNOSIS — I480 Paroxysmal atrial fibrillation: Secondary | ICD-10-CM

## 2019-12-10 DIAGNOSIS — I442 Atrioventricular block, complete: Secondary | ICD-10-CM | POA: Diagnosis not present

## 2019-12-10 DIAGNOSIS — I5043 Acute on chronic combined systolic (congestive) and diastolic (congestive) heart failure: Secondary | ICD-10-CM

## 2019-12-10 NOTE — Progress Notes (Signed)
Yes, please arrange a right and left heart catheterization for chronic systolic heart failure. Also please ask in the request they measure gradients across the aortic valve   [name] MD

## 2019-12-10 NOTE — Progress Notes (Signed)
John [name]. Please schedule this patient for a right and left heart cathterization. There needs to be a note to request they measure gradient across the aortic valve per Dr Nelly Laurence statement below. When you find out the schedule please forward that to me so I can put the orders in. Thanks   Yes, please arrange a right and left heart catheterization for chronic systolic heart failure. Also please ask in the request they measure gradients across the aortic valve

## 2019-12-10 NOTE — Patient Instructions (Signed)
Medication Instructions:  Continue all current medications.   Labwork: none  Testing/Procedures: none  Follow-Up: 6 months   Any Other Special Instructions Will Be Listed Below (If Applicable).   If you need a refill on your cardiac medications before your next appointment, please call your pharmacy.  

## 2019-12-15 ENCOUNTER — Other Ambulatory Visit: Payer: Self-pay | Admitting: *Deleted

## 2019-12-15 ENCOUNTER — Encounter: Payer: Self-pay | Admitting: *Deleted

## 2019-12-15 DIAGNOSIS — Z01812 Encounter for preprocedural laboratory examination: Secondary | ICD-10-CM

## 2019-12-15 DIAGNOSIS — I10 Essential (primary) hypertension: Secondary | ICD-10-CM

## 2019-12-15 DIAGNOSIS — I5042 Chronic combined systolic (congestive) and diastolic (congestive) heart failure: Secondary | ICD-10-CM

## 2019-12-15 NOTE — Progress Notes (Signed)
Right & Left heart cath scheduled for Monday, 12/21/19 at [facility] to arrive at 5:30 am for a 7:30 case - Dr. Saunders Revel.  Covid screening & labs scheduled for Friday, 12/18/19.  Instructions given & patient verbalized understanding.

## 2019-12-16 ENCOUNTER — Telehealth: Payer: Self-pay | Admitting: Family Medicine

## 2019-12-16 NOTE — Telephone Encounter (Signed)
Pre-cert Verification for the following procedure     Right & Left heart cath scheduled for Monday, 9/27 at 7:30 - Dr. Saunders Revel.

## 2019-12-17 ENCOUNTER — Telehealth: Payer: Self-pay | Admitting: *Deleted

## 2019-12-17 NOTE — Telephone Encounter (Addendum)
Pt contacted pre-catheterization scheduled at [facility] for: Monday December 21, 2019 7:30 AM Verified arrival time and place: [place] [facility]) at: 5:30 AM   No solid food after midnight prior to cath, clear liquids until 5 AM day of procedure.  Hold: Eliquis-none 12/19/19 until post procedure. No diabetes medications AM of procedure:   1/2 usual Treshiba dose  PM prior to procedure   Metformin-day of procedure and 48 hours post procedure Torsemide-KCl-AM of procedure Metolazone -AM of procedure Sprironlactone-AM of procedure Viagra-72 hours pre-procedure  Except hold medications AM meds can be  taken pre-cath with sips of water including: ASA 81 mg   Confirmed patient has responsible adult to drive home post procedure and be with patient first 24 hours after arriving home: yes  You are allowed ONE visitor in the waiting room during the time you are at the hospital for your procedure. Both you and your visitor must wear a mask once you enter the hospital.       COVID-19 Pre-Screening Questions:  . In the past 14 days have you had a new cough, new headache, new nasal congestion, fever (100.4 or greater) unexplained body aches, new sore throat, or sudden loss of taste or sense of smell? no . In the past 14 days have you been around anyone with known Covid 19? no . Have you been vaccinated for COVID-19? Yes, see immunization history  Reviewed procedure/mask/visitor instructions, COVID-19 questions with patient.

## 2019-12-17 NOTE — Telephone Encounter (Signed)
Reviewed procedure/mask/visitor instructions, COVID-19 questions with patient. 

## 2019-12-17 NOTE — Telephone Encounter (Signed)
LMTCB to review procedure instructions

## 2019-12-18 ENCOUNTER — Other Ambulatory Visit (HOSPITAL_COMMUNITY)
Admission: RE | Admit: 2019-12-18 | Discharge: 2019-12-18 | Disposition: A | Discharge status: Home / Self Care | Payer: Medicare HMO | Source: Ambulatory Visit | Attending: Internal Medicine | Admitting: Internal Medicine

## 2019-12-18 ENCOUNTER — Other Ambulatory Visit (HOSPITAL_COMMUNITY)
Admission: RE | Admit: 2019-12-18 | Discharge: 2019-12-18 | Disposition: A | Discharge status: Home / Self Care | Payer: Medicare HMO | Source: Home / Self Care | Attending: Family Medicine | Admitting: Family Medicine

## 2019-12-18 ENCOUNTER — Other Ambulatory Visit: Payer: Self-pay

## 2019-12-18 DIAGNOSIS — Z01812 Encounter for preprocedural laboratory examination: Secondary | ICD-10-CM | POA: Diagnosis not present

## 2019-12-18 DIAGNOSIS — Z20822 Contact with and (suspected) exposure to covid-19: Secondary | ICD-10-CM | POA: Insufficient documentation

## 2019-12-18 LAB — CBC
HCT: 43.2 % (ref 39.0–52.0)
Hemoglobin: 14.3 g/dL (ref 13.0–17.0)
MCH: 30.2 pg (ref 26.0–34.0)
MCHC: 33.1 g/dL (ref 30.0–36.0)
MCV: 91.3 fL (ref 80.0–100.0)
Platelets: 89 10*3/uL — ABNORMAL LOW (ref 150–400)
RBC: 4.73 MIL/uL (ref 4.22–5.81)
RDW: 17.2 % — ABNORMAL HIGH (ref 11.5–15.5)
WBC: 6.5 10*3/uL (ref 4.0–10.5)
nRBC: 0 % (ref 0.0–0.2)

## 2019-12-18 LAB — BASIC METABOLIC PANEL
Anion gap: 11 (ref 5–15)
BUN: 43 mg/dL — ABNORMAL HIGH (ref 8–23)
CO2: 28 mmol/L (ref 22–32)
Calcium: 9.5 mg/dL (ref 8.9–10.3)
Chloride: 95 mmol/L — ABNORMAL LOW (ref 98–111)
Creatinine, Ser: 1.63 mg/dL — ABNORMAL HIGH (ref 0.61–1.24)
GFR calc Af Amer: 48 mL/min — ABNORMAL LOW (ref >60)
GFR calc non Af Amer: 41 mL/min — ABNORMAL LOW (ref >60)
Glucose, Bld: 82 mg/dL (ref 70–99)
Potassium: 3.5 mmol/L (ref 3.5–5.1)
Sodium: 134 mmol/L — ABNORMAL LOW (ref 135–145)

## 2019-12-18 LAB — SARS CORONAVIRUS 2 (TAT 6-24 HRS): SARS Coronavirus 2: NEGATIVE

## 2019-12-19 ENCOUNTER — Telehealth: Payer: Self-pay | Admitting: Medical

## 2019-12-19 NOTE — Telephone Encounter (Signed)
Patient is scheduled for heart cath on Monday 12/21/19 for cardiomyopathy and AS. He is on eliquis for Afib. He was called 9/23 to go over pre-procedure instructions and medications. He called this morning very confused about which medications to take. We went over all medication intructions again.Says he should stop Eliquis on 9/25 (today) however he took the AM dose at 7:30AM. Told him to hold this until post-procedure Monday. He voiced his understanding. Will let Dr. Harl Bowie know and Dr. Saunders Revel being that it appears he is doing the cath.   Deeksha Cotrell.

## 2019-12-21 ENCOUNTER — Ambulatory Visit (HOSPITAL_COMMUNITY)
Admission: RE | Admit: 2019-12-21 | Discharge: 2019-12-21 | Disposition: A | Discharge status: Home / Self Care | Payer: Medicare HMO | Attending: Internal Medicine | Admitting: Internal Medicine

## 2019-12-21 ENCOUNTER — Encounter (HOSPITAL_COMMUNITY): Payer: Self-pay | Admitting: Internal Medicine

## 2019-12-21 ENCOUNTER — Ambulatory Visit (HOSPITAL_COMMUNITY)
Admission: RE | Disposition: A | Discharge status: Home / Self Care | Payer: Self-pay | Source: Home / Self Care | Attending: Internal Medicine

## 2019-12-21 ENCOUNTER — Other Ambulatory Visit: Payer: Self-pay

## 2019-12-21 DIAGNOSIS — E119 Type 2 diabetes mellitus without complications: Secondary | ICD-10-CM | POA: Diagnosis not present

## 2019-12-21 DIAGNOSIS — I110 Hypertensive heart disease with heart failure: Secondary | ICD-10-CM | POA: Diagnosis not present

## 2019-12-21 DIAGNOSIS — Z6831 Body mass index (BMI) 31.0-31.9, adult: Secondary | ICD-10-CM | POA: Insufficient documentation

## 2019-12-21 DIAGNOSIS — E785 Hyperlipidemia, unspecified: Secondary | ICD-10-CM | POA: Diagnosis not present

## 2019-12-21 DIAGNOSIS — I5022 Chronic systolic (congestive) heart failure: Secondary | ICD-10-CM | POA: Diagnosis not present

## 2019-12-21 DIAGNOSIS — Z87891 Personal history of nicotine dependence: Secondary | ICD-10-CM | POA: Diagnosis not present

## 2019-12-21 DIAGNOSIS — Z794 Long term (current) use of insulin: Secondary | ICD-10-CM | POA: Insufficient documentation

## 2019-12-21 DIAGNOSIS — I442 Atrioventricular block, complete: Secondary | ICD-10-CM | POA: Diagnosis not present

## 2019-12-21 DIAGNOSIS — I4892 Unspecified atrial flutter: Secondary | ICD-10-CM | POA: Diagnosis not present

## 2019-12-21 DIAGNOSIS — I5043 Acute on chronic combined systolic (congestive) and diastolic (congestive) heart failure: Secondary | ICD-10-CM | POA: Diagnosis not present

## 2019-12-21 DIAGNOSIS — Z7901 Long term (current) use of anticoagulants: Secondary | ICD-10-CM | POA: Diagnosis not present

## 2019-12-21 DIAGNOSIS — I471 Supraventricular tachycardia: Secondary | ICD-10-CM | POA: Insufficient documentation

## 2019-12-21 DIAGNOSIS — I350 Nonrheumatic aortic (valve) stenosis: Secondary | ICD-10-CM | POA: Diagnosis not present

## 2019-12-21 DIAGNOSIS — Z950 Presence of cardiac pacemaker: Secondary | ICD-10-CM | POA: Diagnosis not present

## 2019-12-21 DIAGNOSIS — I4821 Permanent atrial fibrillation: Secondary | ICD-10-CM | POA: Insufficient documentation

## 2019-12-21 DIAGNOSIS — Z79899 Other long term (current) drug therapy: Secondary | ICD-10-CM | POA: Insufficient documentation

## 2019-12-21 HISTORY — PX: RIGHT/LEFT HEART CATH AND CORONARY ANGIOGRAPHY: CATH118266

## 2019-12-21 LAB — POCT I-STAT EG7
Acid-Base Excess: 6 mmol/L — ABNORMAL HIGH (ref 0.0–2.0)
Bicarbonate: 31.7 mmol/L — ABNORMAL HIGH (ref 20.0–28.0)
Calcium, Ion: 1.13 mmol/L — ABNORMAL LOW (ref 1.15–1.40)
HCT: 43 % (ref 39.0–52.0)
Hemoglobin: 14.6 g/dL (ref 13.0–17.0)
O2 Saturation: 60 %
Potassium: 3.2 mmol/L — ABNORMAL LOW (ref 3.5–5.1)
Sodium: 136 mmol/L (ref 135–145)
TCO2: 33 mmol/L — ABNORMAL HIGH (ref 22–32)
pCO2, Ven: 47.4 mmHg (ref 44.0–60.0)
pH, Ven: 7.433 — ABNORMAL HIGH (ref 7.250–7.430)
pO2, Ven: 31 mmHg — CL (ref 32.0–45.0)

## 2019-12-21 LAB — POCT I-STAT 7, (LYTES, BLD GAS, ICA,H+H)
Acid-Base Excess: 6 mmol/L — ABNORMAL HIGH (ref 0.0–2.0)
Bicarbonate: 28.6 mmol/L — ABNORMAL HIGH (ref 20.0–28.0)
Calcium, Ion: 1.1 mmol/L — ABNORMAL LOW (ref 1.15–1.40)
HCT: 42 % (ref 39.0–52.0)
Hemoglobin: 14.3 g/dL (ref 13.0–17.0)
O2 Saturation: 98 %
Potassium: 3.2 mmol/L — ABNORMAL LOW (ref 3.5–5.1)
Sodium: 135 mmol/L (ref 135–145)
TCO2: 30 mmol/L (ref 22–32)
pCO2 arterial: 33.2 mmHg (ref 32.0–48.0)
pH, Arterial: 7.543 — ABNORMAL HIGH (ref 7.350–7.450)
pO2, Arterial: 84 mmHg (ref 83.0–108.0)

## 2019-12-21 LAB — GLUCOSE, CAPILLARY
Glucose-Capillary: 173 mg/dL — ABNORMAL HIGH (ref 70–99)
Glucose-Capillary: 111 mg/dL — ABNORMAL HIGH (ref 70–99)

## 2019-12-21 SURGERY — RIGHT/LEFT HEART CATH AND CORONARY ANGIOGRAPHY
Anesthesia: LOCAL

## 2019-12-21 MED ORDER — LIDOCAINE HCL (PF) 1 % IJ SOLN
INTRAMUSCULAR | Status: AC
  Filled 2019-12-21: qty 30

## 2019-12-21 MED ORDER — VERAPAMIL HCL 2.5 MG/ML IV SOLN
INTRAVENOUS | Status: DC | PRN
  Administered 2019-12-21: 10 mL via INTRA_ARTERIAL

## 2019-12-21 MED ORDER — SODIUM CHLORIDE 0.9% FLUSH: 3.0000 mL | INTRAVENOUS | Status: DC | PRN

## 2019-12-21 MED ORDER — HEPARIN SODIUM (PORCINE) 1000 UNIT/ML IJ SOLN
INTRAMUSCULAR | Status: AC
  Filled 2019-12-21: qty 1

## 2019-12-21 MED ORDER — APIXABAN 5 MG PO TABS
5.0000 mg | ORAL_TABLET | Freq: Two times a day (BID) | ORAL | 3 refills | Status: DC
Start: 2019-12-22 — End: 2020-05-24

## 2019-12-21 MED ORDER — MIDAZOLAM HCL 2 MG/2ML IJ SOLN
INTRAMUSCULAR | Status: AC
  Filled 2019-12-21: qty 2

## 2019-12-21 MED ORDER — LABETALOL HCL 5 MG/ML IV SOLN: 10.0000 mg | INTRAVENOUS | Status: DC | PRN

## 2019-12-21 MED ORDER — ONDANSETRON HCL 4 MG/2ML IJ SOLN: 4.0000 mg | Freq: Four times a day (QID) | INTRAMUSCULAR | Status: DC | PRN

## 2019-12-21 MED ORDER — HEPARIN (PORCINE) IN NACL 1000-0.9 UT/500ML-% IV SOLN
INTRAVENOUS | Status: DC | PRN
  Administered 2019-12-21 (×2): 500 mL

## 2019-12-21 MED ORDER — HYDRALAZINE HCL 20 MG/ML IJ SOLN: 10.0000 mg | INTRAMUSCULAR | Status: DC | PRN

## 2019-12-21 MED ORDER — SODIUM CHLORIDE 0.9 % IV SOLN: 250.0000 mL | INTRAVENOUS | Status: DC | PRN

## 2019-12-21 MED ORDER — ACETAMINOPHEN 325 MG PO TABS: 650.0000 mg | ORAL_TABLET | ORAL | Status: DC | PRN

## 2019-12-21 MED ORDER — IOHEXOL 350 MG/ML SOLN
INTRAVENOUS | Status: DC | PRN
  Administered 2019-12-21: 25 mL

## 2019-12-21 MED ORDER — SODIUM CHLORIDE 0.9% FLUSH: 3.0000 mL | Freq: Two times a day (BID) | INTRAVENOUS | Status: DC

## 2019-12-21 MED ORDER — HEPARIN (PORCINE) IN NACL 1000-0.9 UT/500ML-% IV SOLN
INTRAVENOUS | Status: AC
  Filled 2019-12-21: qty 1000

## 2019-12-21 MED ORDER — SODIUM CHLORIDE 0.9 % IV SOLN: INTRAVENOUS | Status: DC

## 2019-12-21 MED ORDER — FENTANYL CITRATE (PF) 100 MCG/2ML IJ SOLN
INTRAMUSCULAR | Status: DC | PRN
Start: 2019-12-21 — End: 2019-12-21
  Administered 2019-12-21: 25 ug via INTRAVENOUS

## 2019-12-21 MED ORDER — LIDOCAINE HCL (PF) 1 % IJ SOLN
INTRAMUSCULAR | Status: DC | PRN
  Administered 2019-12-21 (×2): 2 mL

## 2019-12-21 MED ORDER — HEPARIN SODIUM (PORCINE) 1000 UNIT/ML IJ SOLN
INTRAMUSCULAR | Status: DC | PRN
  Administered 2019-12-21: 5000 [IU] via INTRAVENOUS

## 2019-12-21 MED ORDER — ASPIRIN 81 MG PO CHEW: 81.0000 mg | CHEWABLE_TABLET | ORAL | Status: DC

## 2019-12-21 MED ORDER — HEPARIN (PORCINE) IN NACL 1000-0.9 UT/500ML-% IV SOLN
INTRAVENOUS | Status: AC
  Filled 2019-12-21: qty 500

## 2019-12-21 MED ORDER — FENTANYL CITRATE (PF) 100 MCG/2ML IJ SOLN
INTRAMUSCULAR | Status: AC
  Filled 2019-12-21: qty 2

## 2019-12-21 MED ORDER — MIDAZOLAM HCL 2 MG/2ML IJ SOLN
INTRAMUSCULAR | Status: DC | PRN
  Administered 2019-12-21: 1 mg via INTRAVENOUS

## 2019-12-21 MED ORDER — VERAPAMIL HCL 2.5 MG/ML IV SOLN
INTRAVENOUS | Status: AC
  Filled 2019-12-21: qty 2

## 2019-12-21 SURGICAL SUPPLY — 15 items
CATH 5FR JL3.5 JR4 ANG PIG MP (CATHETERS) ×2 IMPLANT
CATH BALLN WEDGE 5F 110CM (CATHETERS) ×2 IMPLANT
CATH INFINITI 4FR 145 PIGTAIL (CATHETERS) ×2 IMPLANT
DEVICE RAD COMP TR BAND LRG (VASCULAR PRODUCTS) ×2 IMPLANT
GLIDESHEATH SLEND SS 6F .021 (SHEATH) ×2 IMPLANT
GUIDEWIRE INQWIRE 1.5J.035X260 (WIRE) ×1 IMPLANT
INQWIRE 1.5J .035X260CM (WIRE) ×2
KIT ENCORE 26 ADVANTAGE (KITS) ×2 IMPLANT
KIT HEART LEFT (KITS) ×2 IMPLANT
PACK CARDIAC CATHETERIZATION (CUSTOM PROCEDURE TRAY) ×2 IMPLANT
SHEATH 6FR 75 DEST SLENDER (SHEATH) ×2 IMPLANT
SHEATH GLIDE SLENDER 4/5FR (SHEATH) ×2 IMPLANT
TRANSDUCER W/STOPCOCK (MISCELLANEOUS) ×2 IMPLANT
TUBING CIL FLEX 10 FLL-RA (TUBING) ×2 IMPLANT
WIRE EMERALD 3MM-J .025X260CM (WIRE) ×2 IMPLANT

## 2019-12-21 NOTE — Discharge Instructions (Signed)
Do not take metformin until you have had repeat blood work and have been instructed to restart this medication by Dr. Nelly Laurence office or your primary care provider.  Drink plenty of fluids for 48 hours and keep wrist elevated at heart level for 24 hours  Radial Site Care   This sheet gives you information about how to care for yourself after your procedure. Your health care provider may also give you more specific instructions. If you have problems or questions, contact your health care provider. What can I expect after the procedure? After the procedure, it is common to have:  Bruising and tenderness at the catheter insertion area. Follow these instructions at home: Medicines  Take over-the-counter and prescription medicines only as told by your health care provider. Insertion site care 1. Follow instructions from your health care provider about how to take care of your insertion site. Make sure you: ? Wash your hands with soap and water before you change your bandage (dressing). If soap and water are not available, use hand sanitizer. ? Remove your dressing as told by your health care provider. In 24 hours 2. Check your insertion site every day for signs of infection. Check for: ? Redness, swelling, or pain. ? Fluid or blood. ? Pus or a bad smell. ? Warmth. 3. Do not take baths, swim, or use a hot tub until your health care provider approves. 4. You may shower 24-48 hours after the procedure, or as directed by your health care provider. ? Remove the dressing and gently wash the site with plain soap and water. ? Pat the area dry with a clean towel. ? Do not rub the site. That could cause bleeding. 5. Do not apply powder or lotion to the site. Activity   1. For 24 hours after the procedure, or as directed by your health care provider: ? Do not flex or bend the affected arm. ? Do not push or pull heavy objects with the affected arm. ? Do not drive yourself home from the hospital  or clinic. You may drive 24 hours after the procedure unless your health care provider tells you not to. ? Do not operate machinery or power tools. 2. Do not lift anything that is heavier than 10 lb (4.5 kg), or the limit that you are told, until your health care provider says that it is safe.  For 4 days 3. Ask your health care provider when it is okay to: ? Return to work or school. ? Resume usual physical activities or sports. ? Resume sexual activity. General instructions  If the catheter site starts to bleed, raise your arm and put firm pressure on the site. If the bleeding does not stop, get help right away. This is a medical emergency.  If you went home on the same day as your procedure, a responsible adult should be with you for the first 24 hours after you arrive home.  Keep all follow-up visits as told by your health care provider. This is important. Contact a health care provider if:  You have a fever.  You have redness, swelling, or yellow drainage around your insertion site. Get help right away if:  You have unusual pain at the radial site.  The catheter insertion area swells very fast.  The insertion area is bleeding, and the bleeding does not stop when you hold steady pressure on the area.  Your arm or hand becomes pale, cool, tingly, or numb. These symptoms may represent a serious problem that  is an emergency. Do not wait to see if the symptoms will go away. Get medical help right away. Call your local emergency services (911 in the [place] Do not drive yourself to the hospital. Summary  After the procedure, it is common to have bruising and tenderness at the site.  Follow instructions from your health care provider about how to take care of your radial site wound. Check the wound every day for signs of infection.  Do not lift anything that is heavier than 10 lb (4.5 kg), or the limit that you are told, until your health care provider says that it is safe. This  information is not intended to replace advice given to you by your health care provider. Make sure you discuss any questions you have with your health care provider. Document Revised: 04/17/2017 Document Reviewed: 04/17/2017 Elsevier Patient Education  2020 [employer].

## 2019-12-21 NOTE — Interval H&P Note (Signed)
History and Physical Interval Note:  12/21/2019 7:20 AM  [name]  has presented today for surgery, with the diagnosis of heart failure and aortic stenosis.  The various methods of treatment have been discussed with the patient and family. After consideration of risks, benefits and other options for treatment, the patient has consented to  Procedure(s): RIGHT/LEFT HEART CATH AND CORONARY ANGIOGRAPHY (N/A) as a surgical intervention.  The patient's history has been reviewed, patient examined, no change in status, stable for surgery.  I have reviewed the patient's chart and labs.  Questions were answered to the patient's satisfaction.    Cath Lab Visit (complete for each Cath Lab visit)  Clinical Evaluation Leading to the Procedure:   ACS: No.  Non-ACS:    Anginal/Heart Failure Classification: NYHA III  Anti-ischemic medical therapy: Minimal Therapy (1 class of medications)  Non-Invasive Test Results: No non-invasive testing performed (LVEF ~35% with moderate to severe aortic stenosis)  Prior CABG: No previous CABG  Eustacia Urbanek

## 2019-12-21 NOTE — Brief Op Note (Signed)
BRIEF CARDIAC CATHETERIZATION NOTE  12/21/2019  8:44 AM  PATIENT:  John [name]  74 y.o. male  PRE-OPERATIVE DIAGNOSIS:  Chronic HFrEF  POST-OPERATIVE DIAGNOSIS:  Same  PROCEDURE:  Procedure(s): RIGHT/LEFT HEART CATH AND CORONARY ANGIOGRAPHY (N/A)  SURGEON:  Surgeon(s) and Role:    Nelva Bush, MD - Primary  FINDINGS: 1. No angiographically significant coronary artery disease. 2. Moderately elevated left heart filling pressures. 3. Severely elevated right heart filling pressures. 4. Moderately reduced Fick cardiac output/index. 5. Moderate aortic stenosis.  RECOMMENDATIONS: 1. Continue optimization of evidence-based heart failure as well as further diuresis. 2. Primary prevention of coronary artery disease.  Nelva Bush, MD [facility]

## 2019-12-22 MED FILL — Heparin Sod (Porcine)-NaCl IV Soln 1000 Unit/500ML-0.9%: INTRAVENOUS | Qty: 500 | Status: AC

## 2019-12-23 ENCOUNTER — Telehealth: Payer: Self-pay | Admitting: *Deleted

## 2019-12-23 DIAGNOSIS — Z79899 Other long term (current) drug therapy: Secondary | ICD-10-CM

## 2019-12-23 DIAGNOSIS — I5042 Chronic combined systolic (congestive) and diastolic (congestive) heart failure: Secondary | ICD-10-CM

## 2019-12-23 DIAGNOSIS — I10 Essential (primary) hypertension: Secondary | ICD-10-CM

## 2019-12-23 NOTE — Telephone Encounter (Signed)
Patient informed and verbalized understanding of plan. Lab order faxed to [facility].

## 2019-12-23 NOTE — Telephone Encounter (Signed)
-----   Message from Verta Ellen., NP sent at 12/21/2019  1:36 PM EDT ----- Regarding: Lab work to recheck patient's creatinine so he can restart Metformin if creatinine is okay [name] had his cardiac catheterization today.  Dr. Saunders Revel wants [place] to get a basic metabolic panel on him this coming Friday to check his creatinine and make sure he can restart his Metformin.  Can you order that and have him get that done on Friday?  Thank you

## 2019-12-25 ENCOUNTER — Other Ambulatory Visit (HOSPITAL_COMMUNITY)
Admission: RE | Admit: 2019-12-25 | Discharge: 2019-12-25 | Disposition: A | Discharge status: Home / Self Care | Payer: Medicare HMO | Source: Ambulatory Visit | Attending: Family Medicine | Admitting: Family Medicine

## 2019-12-25 ENCOUNTER — Other Ambulatory Visit: Payer: Self-pay

## 2019-12-25 DIAGNOSIS — I5042 Chronic combined systolic (congestive) and diastolic (congestive) heart failure: Secondary | ICD-10-CM | POA: Diagnosis not present

## 2019-12-25 LAB — BASIC METABOLIC PANEL
Anion gap: 11 (ref 5–15)
BUN: 54 mg/dL — ABNORMAL HIGH (ref 8–23)
CO2: 27 mmol/L (ref 22–32)
Calcium: 9 mg/dL (ref 8.9–10.3)
Chloride: 92 mmol/L — ABNORMAL LOW (ref 98–111)
Creatinine, Ser: 1.73 mg/dL — ABNORMAL HIGH (ref 0.61–1.24)
GFR calc Af Amer: 44 mL/min — ABNORMAL LOW (ref >60)
GFR calc non Af Amer: 38 mL/min — ABNORMAL LOW (ref >60)
Glucose, Bld: 99 mg/dL (ref 70–99)
Potassium: 3.5 mmol/L (ref 3.5–5.1)
Sodium: 130 mmol/L — ABNORMAL LOW (ref 135–145)

## 2019-12-28 ENCOUNTER — Other Ambulatory Visit: Payer: Self-pay | Admitting: Internal Medicine

## 2019-12-28 ENCOUNTER — Telehealth: Payer: Self-pay | Admitting: Family Medicine

## 2019-12-28 ENCOUNTER — Ambulatory Visit (HOSPITAL_COMMUNITY): Payer: Medicare HMO

## 2019-12-28 ENCOUNTER — Other Ambulatory Visit: Payer: Self-pay | Admitting: "Endocrinology

## 2019-12-28 NOTE — Telephone Encounter (Signed)
Pt aware - routed to pcp  

## 2019-12-28 NOTE — Telephone Encounter (Signed)
Mr. Laughter is returning a call for recent lab results.

## 2019-12-28 NOTE — Telephone Encounter (Signed)
    Creatinine and GFR mildly up from previous labs 1.73 and 44. Previous labs on 12/18/2019 When Crt was 1.63 and GFR was 48. Please forward to PCP

## 2019-12-30 NOTE — Telephone Encounter (Signed)
Pt's pharmacy is requesting a refill on benzonatate. Would Dr. Lovena Le like to refill this medication? Please address

## 2019-12-30 NOTE — Telephone Encounter (Signed)
This is a [place] pt.  °

## 2019-12-31 ENCOUNTER — Other Ambulatory Visit: Payer: Self-pay | Admitting: Family Medicine

## 2019-12-31 DIAGNOSIS — R748 Abnormal levels of other serum enzymes: Secondary | ICD-10-CM

## 2020-01-05 ENCOUNTER — Ambulatory Visit (INDEPENDENT_AMBULATORY_CARE_PROVIDER_SITE_OTHER): Payer: Medicare HMO | Admitting: Cardiology

## 2020-01-05 ENCOUNTER — Encounter: Payer: Self-pay | Admitting: Cardiology

## 2020-01-05 VITALS — BP 98/60 | HR 84 | Ht 72.0 in | Wt 223.4 lb

## 2020-01-05 DIAGNOSIS — I5022 Chronic systolic (congestive) heart failure: Secondary | ICD-10-CM

## 2020-01-05 DIAGNOSIS — E782 Mixed hyperlipidemia: Secondary | ICD-10-CM | POA: Diagnosis not present

## 2020-01-05 MED ORDER — TORSEMIDE 20 MG PO TABS: ORAL_TABLET | ORAL | 3 refills | Status: DC

## 2020-01-05 MED ORDER — SPIRONOLACTONE 25 MG PO TABS: 12.5000 mg | ORAL_TABLET | Freq: Every day | ORAL | 1 refills | Status: DC

## 2020-01-05 NOTE — Patient Instructions (Signed)
Your physician recommends that you schedule a follow-up appointment in: [place] physician has recommended you make the following change in your medication:   DECREASE ALDACTONE 12.5 MG (1/2 TABLET) DAILY   TAKE TORSEMIDE 20 MG TWICE DAILY - MAY TAKE ADDITIONAL 20 MG AS NEEDED FOR SWELLING   Thank you for choosing [facility]!!

## 2020-01-05 NOTE — Progress Notes (Signed)
Clinical Summary [name] is a 74 y.o.male seen today for follow up of the following medical problems .  1. Chronic combined systolic/diastolic HF - - 03/1912 echo LVEF 45-50%, grade II DDx, normal RV, mild aortic stenosis.  - 09/2019 echo LVEF 30-35%,  - 11/2019 RHC/LHC: no significant CAD. CI 1.8, mean PA 30, PCWP 21, LVEDP 24  -from 09/2019 pacemaker check Vpaced about 43% of the time.    - weight today down to 223 lbs. Home weights 217 lbs and stable - no recent edema. Breathing is improving, occasional orthopnea - compliant with meds. Taking 48m bid. STopped metolazone. Awaiting on renal evaluation. He also stopped his adlactone.  - no lightheadedness or dizziness.    2. Aflutter - s/p ablation in 2012   3. PAF - on eliquis for stroke prevention - denies recent symptoms.    4. Complete heart block - had pacemaker placed in 2017 - normal device function by Jan 2021 check, upcoming check later this month  5. HTN - compliant with meds   6. Hyperlipidemia - 07/2019 T C 178 TG 100 HDL 51 LDL 107   7. CKD 3 - uptrend in Cr to 1.6, pcp ahs referred to renal.   8. Aortic stenosis - 07/2018 echo AVA VTI 1, mean grad 11.5, DI 0.23, SVI 33 - 09/2019 echo LVEF 30-35%, mean grad 16, AVA VTI 1.02, DI 0.23 -- 12/21/19 cath: AV mean grad 21, AVA VTI 1.3    9. Mitral regurgitation - 09/2019 echo mod to severe MR - 10/2019 TEE: mod MR, MR VC 0.4 cm.   9. Abnormal liver tests - followed by GI   Past Medical History:  Diagnosis Date  . Atrial flutter ([facility] 12/2010   Admitted with symptomatic bradycardia (HR 40s), atrial flutter with slow ventricular response 12/2010 + volume overload; AV nodal agents d/c'd and Pradaxa started; RFA in 01/2011  . CHF (congestive heart failure) ([facility]   . Chronic combined systolic and diastolic heart failure ([facility]    a. echo 01/06/11: mild LVH, EF 65-70%, mod to severe LAE, mild RVE, mild RAE, PASP 32;   TEE 10/12: EF 45-50% b.  EF 45 to 50% by echo in 07/2018  . Class 2 severe obesity due to excess calories with serious comorbidity and body mass index (BMI) of 37.0 to 37.9 in adult ([facility] 07/17/2017  . Diabetes mellitus    non insulin dependant  . Hyperlipidemia   . Hypertension   . Osteoarthritis   . Presence of permanent cardiac pacemaker   . PSVT (paroxysmal supraventricular tachycardia) (HCC)    Possibly atrial flutter     No Known Allergies   Current Outpatient Medications  Medication Sig Dispense Refill  . ACCU-CHEK AVIVA PLUS test strip TEST BLOOD SUGAR TWICE DAILY BEFORE BREAKFAST AND AT BEDTIME 200 strip 1  . apixaban (ELIQUIS) 5 MG TABS tablet Take 1 tablet (5 mg total) by mouth 2 (two) times daily. 180 tablet 3  . benzonatate (TESSALON) 100 MG capsule TAKE 1 CAPSULE THREE TIMES DAILY AS NEEDED FOR COUGH (Patient taking differently: Take 100 mg by mouth 3 (three) times daily as needed for cough. ) 90 capsule 1  . carvedilol (COREG) 25 MG tablet Take 1 tablet (25 mg total) by mouth 2 (two) times daily with a meal. 180 tablet 3  . HYDROcodone-acetaminophen (NORCO/VICODIN) 5-325 MG tablet Take one tablet po every 4 hrs prn pain (Patient taking differently: Take 1 tablet by mouth every 4 (four) hours  as needed for moderate pain. ) 28 tablet 0  . hydrocortisone 2.5 % cream Apply BID to affected area (Patient taking differently: Apply 1 application topically 2 (two) times daily as needed (skin irritation.). ) 60 g 3  . insulin degludec (TRESIBA FLEXTOUCH) 200 UNIT/ML FlexTouch Pen Inject 60 Units into the skin at bedtime. 30 mL 1  . Insulin Pen Needle (BD PEN NEEDLE NANO U/F) 32G X 4 MM MISC 1 each by Does not apply route 4 (four) times daily. 150 each 5  . metolazone (ZAROXOLYN) 2.5 MG tablet Take 1 tablet (2.5 mg total) by mouth 2 (two) times a week. Monday and Friday (Patient taking differently: Take 2.5 mg by mouth every other day. ) 15 tablet 1  . Multiple Vitamin (MULTIVITAMIN WITH MINERALS) TABS tablet  Take 1 tablet by mouth daily. One A Day for Men    . potassium chloride SA (KLOR-CON) 20 MEQ tablet Take 60 meq in the am, at lunch, and in the evening (Patient taking differently: Take 60 mEq by mouth daily. ) 270 tablet 6  . rosuvastatin (CRESTOR) 20 MG tablet Take 1 tablet (20 mg total) by mouth daily. (Patient not taking: Reported on 12/17/2019) 90 tablet 1  . sildenafil (VIAGRA) 50 MG tablet Take 1 tablet (50 mg total) by mouth daily as needed for erectile dysfunction. 30 tablet 3  . spironolactone (ALDACTONE) 25 MG tablet TAKE 1 TABLET BY MOUTH EVERY DAY 90 tablet 3  . torsemide (DEMADEX) 20 MG tablet Take 2 tablets (40 mg total) by mouth 2 (two) times daily. 120 tablet 3  . TRULICITY 1.5 WE/9.9BZ SOPN INJECT 1.5MG (1 PEN) SUBCUTANEOUSLY EVERY WEEK 6 mL 0  . Zinc 50 MG TABS Take 50 mg by mouth daily.     No current facility-administered medications for this visit.     Past Surgical History:  Procedure Laterality Date  . ATRIAL FLUTTER ABLATION N/A 02/07/2011   Procedure: ATRIAL FLUTTER ABLATION;  Surgeon: Evans Lance, MD;  Location: [facility] CATH LAB;  Service: Cardiovascular;  Laterality: N/A;  . CARDIAC ELECTROPHYSIOLOGY STUDY AND ABLATION  02/07/11  . CARDIOVERSION N/A 03/23/2016   Procedure: CARDIOVERSION;  Surgeon: Evans Lance, MD;  Location: [facility];  Service: Cardiovascular;  Laterality: N/A;  . COLONOSCOPY N/A 04/17/2017   Procedure: COLONOSCOPY;  Surgeon: Rogene Houston, MD;  [facility];  Service: Endoscopy;  Laterality: N/A;  830  . EP IMPLANTABLE DEVICE N/A 08/29/2015   Procedure: Pacemaker Implant;  Surgeon: Evans Lance, MD;  Location: [place] CV LAB;  Service: Cardiovascular;  Laterality: N/A;  . EP IMPLANTABLE DEVICE N/A 12/07/2015   Procedure: PPM Lead Revision/Repair;  Surgeon: Evans Lance, MD;  Location: [place] CV LAB;  Service: Cardiovascular;  Laterality: N/A;  . KNEE ARTHROSCOPY  2005   left  . LUMBAR SPINE SURGERY     "I've had 6  ORs 1972 thru 2004"  . POLYPECTOMY  04/17/2017   Procedure: POLYPECTOMY;  Surgeon: Rogene Houston, MD;  [facility];  Service: Endoscopy;;  transverse colon x3;  . RIGHT/LEFT HEART CATH AND CORONARY ANGIOGRAPHY N/A 12/21/2019   Procedure: RIGHT/LEFT HEART CATH AND CORONARY ANGIOGRAPHY;  Surgeon: Nelva Bush, MD;  Location: [place] CV LAB;  Service: Cardiovascular;  Laterality: N/A;  . TEE WITHOUT CARDIOVERSION N/A 11/05/2019   Procedure: TRANSESOPHAGEAL ECHOCARDIOGRAM (TEE) WITH PROPOFOL;  Surgeon: Arnoldo Lenis, MD;  [facility];  Service: Endoscopy;  Laterality: N/A;  No Known Allergies    Family History  Problem Relation Age of Onset  . Heart attack Mother   . Hypertension Mother   . Heart attack Father   . Hypertension Father   . Heart attack Brother   . Colon cancer Neg Hx      Social History [name] reports that he quit smoking about 33 years ago. His smoking use included cigarettes. He has a 10.00 pack-year smoking history. He has never used smokeless tobacco. [name] reports no history of alcohol use.   Review of Systems CONSTITUTIONAL: No weight loss, fever, chills, weakness or fatigue.  HEENT: Eyes: No visual loss, blurred vision, double vision or yellow sclerae.No hearing loss, sneezing, congestion, runny nose or sore throat.  SKIN: No rash or itching.  CARDIOVASCULAR: per hpi RESPIRATORY: No shortness of breath, cough or sputum.  GASTROINTESTINAL: No anorexia, nausea, vomiting or diarrhea. No abdominal pain or blood.  GENITOURINARY: No burning on urination, no polyuria NEUROLOGICAL: No headache, dizziness, syncope, paralysis, ataxia, numbness or tingling in the extremities. No change in bowel or bladder control.  MUSCULOSKELETAL: No muscle, back pain, joint pain or stiffness.  LYMPHATICS: No enlarged nodes. No history of splenectomy.  PSYCHIATRIC: No history of depression or anxiety.  ENDOCRINOLOGIC: No reports of  sweating, cold or heat intolerance. No polyuria or polydipsia.  Marland Kitchen   Physical Examination Today's Vitals   01/05/20 0942  BP: 98/60  Pulse: 84  SpO2: 98%  Weight: 223 lb 6.4 oz (101.3 kg)  Height: 6' (1.829 m)   Body mass index is 30.3 kg/m.  Gen: resting comfortably, no acute distress HEENT: no scleral icterus, pupils equal round and reactive, no palptable cervical adenopathy,  CV: RRR, no m/r/g, no jvd Resp: Clear to auscultation bilaterally GI: abdomen is soft, non-tender, non-distended, normal bowel sounds, no hepatosplenomegaly MSK: extremities are warm, no edema.  Skin: warm, no rash Neuro:  no focal deficits Psych: appropriate affect   Diagnostic Studies Echocardiogram: 07/2018 IMPRESSIONS   1. The left ventricle has mildly reduced systolic function, with an ejection fraction of 45-50%. The cavity size was mild to moderately dilated. There is mildly increased left ventricular wall thickness. Left ventricular diastolic Doppler parameters are consistent with pseudonormalization. Left ventrical global hypokinesis without regional wall motion abnormalities. 2. The right ventricle has normal systolic function. The cavity was normal. There is no increase in right ventricular wall thickness. 3. Left atrial size was severely dilated. 4. The aortic valve is tricuspid. Mild thickening of the aortic valve. Moderate calcification of the aortic valve. Mild stenosis of the aortic valve. 5. The ascending aorta is normal in size and structure. 6. The inferior vena cava was dilated in size with <50% respiratory variability.  09/2019 echo  1. Left ventricular ejection fraction, by estimation, is 30 to 35%. The  left ventricle has moderately decreased function. The left ventricle  demonstrates global hypokinesis with abnormal septal motion possibly  consistent with pacing. The left  ventricular internal cavity size was mildly dilated. Left ventricular  diastolic parameters  are indeterminate.  2. Right ventricular systolic function is mildly reduced. The right  ventricular size is normal. There is mildly elevated pulmonary artery  systolic pressure. The estimated right ventricular systolic pressure is  01.4 mmHg.  3. Left atrial size was moderately dilated.  4. Right atrial size was moderately dilated.  5. The mitral valve is abnormal, mildly thickened and calcified. Moderate  to severe mitral valve regurgitation, eccentric and posteriorly directed.  Not clear that  PISA calculations best define degree of regurgitation.  6. The aortic valve has an indeterminant number of cusps and is severely  calcified. Aortic valve mean gradient measures 15.5 mmHg. Cannot exclude  severe low gradient aortic stenosis with dimentionless index of 0.23.  7. The inferior vena cava is dilated in size with <50% respiratory  variability, suggesting right atrial pressure of 15 mmHg.   Assessment and Plan  1. Chronic systolic HF - weights are stable, no significant symptoms - medical therapy limited by soft bp's, renal dysfunction - restart aldactone 12.39m daily, continue coreg. Not on ACE/ARB/ARNI at this time given renal function  2. Hyperlipidemia - check with GI if ok to restart statin  3. CKD - awaiting referal to nephrology  4. DM2 - his metformin was stopped around time of recent cath due to uptrending Cr and dye load, will reach out to Dr NDorris Fetchwhether to restart.       JArnoldo Lenis M.D.,

## 2020-01-06 ENCOUNTER — Telehealth: Payer: Self-pay | Admitting: *Deleted

## 2020-01-06 NOTE — Telephone Encounter (Signed)
-----   Message from Arnoldo Lenis, MD sent at 01/05/2020  7:55 PM EDT ----- Thanks for update Dr Girtha Rm can you let patient know we heard back from Dr Dorris Fetch, plan is to remain off metformin at this time   Zandra Abts MD ----- Message ----- From: Cassandria Anger, MD Sent: 01/05/2020   4:42 PM EDT To: Arnoldo Lenis, MD  Dr. Harl Bowie, Thank you for reaching out on our mutual patient. He should stay off of metformin until er recheck his renal function. His other medications can be continued at same doses. I will follow up. Heriberto Antigua  ----- Message ----- From: Arnoldo Lenis, MD Sent: 01/05/2020  10:38 AM EDT To: Cassandria Anger, MD  Mutual patient, metformin was stopped recently due to uptrending Cr and also dye load from recent heart cath. Is his metforming something you would want [place] to restart?   Zandra Abts MD

## 2020-01-11 NOTE — Telephone Encounter (Signed)
Pt voiced understanding - updated med list

## 2020-01-14 ENCOUNTER — Ambulatory Visit (INDEPENDENT_AMBULATORY_CARE_PROVIDER_SITE_OTHER): Payer: Medicare HMO

## 2020-01-14 DIAGNOSIS — I442 Atrioventricular block, complete: Secondary | ICD-10-CM | POA: Diagnosis not present

## 2020-01-14 LAB — CUP PACEART REMOTE DEVICE CHECK
Battery Impedance: 304 Ohm
Battery Remaining Longevity: 90 mo
Battery Voltage: 2.78 V
Brady Statistic AP VP Percent: 16 %
Brady Statistic AP VS Percent: 0 %
Brady Statistic AS VP Percent: 26 %
Brady Statistic AS VS Percent: 57 %
Date Time Interrogation Session: 20211021073032
Implantable Lead Implant Date: 20170605
Implantable Lead Implant Date: 20170605
Implantable Lead Location: 753859
Implantable Lead Location: 753860
Implantable Lead Model: 5076
Implantable Lead Model: 5076
Implantable Pulse Generator Implant Date: 20170605
Lead Channel Impedance Value: 408 Ohm
Lead Channel Impedance Value: 531 Ohm
Lead Channel Pacing Threshold Amplitude: 0.75 V
Lead Channel Pacing Threshold Amplitude: 0.875 V
Lead Channel Pacing Threshold Pulse Width: 0.4 ms
Lead Channel Pacing Threshold Pulse Width: 0.4 ms
Lead Channel Setting Pacing Amplitude: 2 V
Lead Channel Setting Pacing Amplitude: 2.5 V
Lead Channel Setting Pacing Pulse Width: 0.4 ms
Lead Channel Setting Sensing Sensitivity: 4 mV

## 2020-01-19 NOTE — Progress Notes (Signed)
Remote pacemaker transmission.   

## 2020-01-21 ENCOUNTER — Telehealth: Payer: Self-pay | Admitting: *Deleted

## 2020-01-21 DIAGNOSIS — E782 Mixed hyperlipidemia: Secondary | ICD-10-CM

## 2020-01-21 NOTE — Telephone Encounter (Signed)
-----   Message from Arnoldo Lenis, MD sent at 01/20/2020  2:33 PM EDT ----- Can we let patient know I heard back from his GI doctor, Dr Laural Golden, he is ok starting a cholesterol medication. Can we start pravastatin 50m daily, needs cmet in 1 month  JZandra AbtsMD ----- Message ----- From: RRogene Houston MD Sent: 01/06/2020   2:18 PM EDT To: JArnoldo Lenis MD  His transaminases are normal. We have not completed workup but I do not see any contraindication to using statin of your choice. Will need to repeat LFTs 4 weeks later.  Najeeb. ----- Message ----- From: BArnoldo Lenis MD Sent: 01/05/2020  10:38 AM EDT To: NRogene Houston MD  Mutual patient you had seen for elevated LFTs, are you guys ok if we restart his statin. If so any particular statin preference   JZandra AbtsMD

## 2020-01-23 ENCOUNTER — Other Ambulatory Visit: Payer: Self-pay | Admitting: Cardiology

## 2020-01-25 MED ORDER — PRAVASTATIN SODIUM 20 MG PO TABS: 20.0000 mg | ORAL_TABLET | Freq: Every evening | ORAL | 1 refills | Status: DC

## 2020-01-25 NOTE — Telephone Encounter (Signed)
Pt voiced understanding - will have labs done at [facility] with 1 month - has f/u 11/9

## 2020-01-28 ENCOUNTER — Other Ambulatory Visit (HOSPITAL_COMMUNITY): Payer: Self-pay | Admitting: Nephrology

## 2020-01-28 DIAGNOSIS — Z79899 Other long term (current) drug therapy: Secondary | ICD-10-CM | POA: Diagnosis not present

## 2020-01-28 DIAGNOSIS — E1122 Type 2 diabetes mellitus with diabetic chronic kidney disease: Secondary | ICD-10-CM

## 2020-01-28 DIAGNOSIS — I129 Hypertensive chronic kidney disease with stage 1 through stage 4 chronic kidney disease, or unspecified chronic kidney disease: Secondary | ICD-10-CM | POA: Diagnosis not present

## 2020-01-28 DIAGNOSIS — D696 Thrombocytopenia, unspecified: Secondary | ICD-10-CM | POA: Diagnosis not present

## 2020-01-28 DIAGNOSIS — I5022 Chronic systolic (congestive) heart failure: Secondary | ICD-10-CM | POA: Diagnosis not present

## 2020-01-28 DIAGNOSIS — E871 Hypo-osmolality and hyponatremia: Secondary | ICD-10-CM | POA: Diagnosis not present

## 2020-01-28 DIAGNOSIS — E876 Hypokalemia: Secondary | ICD-10-CM | POA: Diagnosis not present

## 2020-01-28 DIAGNOSIS — N189 Chronic kidney disease, unspecified: Secondary | ICD-10-CM | POA: Diagnosis not present

## 2020-02-01 DIAGNOSIS — Z1159 Encounter for screening for other viral diseases: Secondary | ICD-10-CM | POA: Diagnosis not present

## 2020-02-01 DIAGNOSIS — E559 Vitamin D deficiency, unspecified: Secondary | ICD-10-CM | POA: Diagnosis not present

## 2020-02-01 DIAGNOSIS — I5022 Chronic systolic (congestive) heart failure: Secondary | ICD-10-CM | POA: Diagnosis not present

## 2020-02-01 DIAGNOSIS — D638 Anemia in other chronic diseases classified elsewhere: Secondary | ICD-10-CM | POA: Diagnosis not present

## 2020-02-01 DIAGNOSIS — E871 Hypo-osmolality and hyponatremia: Secondary | ICD-10-CM | POA: Diagnosis not present

## 2020-02-01 DIAGNOSIS — I129 Hypertensive chronic kidney disease with stage 1 through stage 4 chronic kidney disease, or unspecified chronic kidney disease: Secondary | ICD-10-CM | POA: Diagnosis not present

## 2020-02-01 DIAGNOSIS — Z79899 Other long term (current) drug therapy: Secondary | ICD-10-CM | POA: Diagnosis not present

## 2020-02-01 DIAGNOSIS — E1122 Type 2 diabetes mellitus with diabetic chronic kidney disease: Secondary | ICD-10-CM | POA: Diagnosis not present

## 2020-02-01 DIAGNOSIS — N189 Chronic kidney disease, unspecified: Secondary | ICD-10-CM | POA: Diagnosis not present

## 2020-02-01 NOTE — Progress Notes (Signed)
Cardiology Office Note  Date: 02/02/2020   ID: RON BESKE, DOB Oct 26, 1945, MRN [id number]  PCP:  Erven Colla, DO  Cardiologist:  [name], MD Electrophysiologist:  None   Chief Complaint: Follow-up chronic combined systolic and diastolic heart failure  History of Present Illness: John [name] is a 74 y.o. male with a history of chronic combined systolic and diastolic heart failure, atrial flutter, HLD, HTN, PSVT.  Last encounter with Dr. Harl Bowie 01/05/2020.  His weight had decreased from 223 pounds down to 217 pounds and stable.  Had no recent edema.  Breathing was improving.  Had occasional orthopnea.  He was continuing torsemide 40 mg daily.  He was restarted on spironolactone 12.5 mg daily.  He was continuing Coreg 25 mg p.o. twice daily.  Not on ACE, ARB, ARNI due to renal function.  Medical therapy was limited due to soft blood pressures and renal dysfunction.  He was planning on checking with GI if okay to restart statin.  Awaiting referral to nephrology.  Metformin was stopped around time of recent catheterization due to increased creatinine and dye load.  He planned on reaching out to Dr. Dorris Fetch as to whether to restart it.   He presents today for 1 month follow-up.  He denies any recent issues other than some occasional blood pressure is lower than 100.  He denied any symptoms with these low blood pressures.  Today blood pressure is 114/80.  He denies any increase in dyspnea on exertion, weight gain, or lower extremity edema.  Denies any anginal symptoms.  No palpitations.  No dizziness, lightheadedness, presyncope or syncope.  No CVA or TIA-like symptoms.  No bleeding issues.  No claudication.  No DVT or PE-like symptoms.  Had a small increase in creatinine from 1.63-1.73 since last lab work.  He is seeing nephrology for renal disease.  He had been restarted on spironolactone 12.5 mg last visit.   Past Medical History:  Diagnosis Date  . Atrial flutter ([place])  12/2010   Admitted with symptomatic bradycardia (HR 40s), atrial flutter with slow ventricular response 12/2010 + volume overload; AV nodal agents d/c'd and Pradaxa started; RFA in 01/2011  . CHF (congestive heart failure) ([place])   . Chronic combined systolic and diastolic heart failure ([place])    a. echo 01/06/11: mild LVH, EF 65-70%, mod to severe LAE, mild RVE, mild RAE, PASP 32;   TEE 10/12: EF 45-50% b. EF 45 to 50% by echo in 07/2018  . Class 2 severe obesity due to excess calories with serious comorbidity and body mass index (BMI) of 37.0 to 37.9 in adult ([place]) 07/17/2017  . Diabetes mellitus    non insulin dependant  . Hyperlipidemia   . Hypertension   . Osteoarthritis   . Presence of permanent cardiac pacemaker   . PSVT (paroxysmal supraventricular tachycardia) (HCC)    Possibly atrial flutter    Past Surgical History:  Procedure Laterality Date  . ATRIAL FLUTTER ABLATION N/A 02/07/2011   Procedure: ATRIAL FLUTTER ABLATION;  Surgeon: Evans Lance, MD;  Location: [facility] CATH LAB;  Service: Cardiovascular;  Laterality: N/A;  . CARDIAC ELECTROPHYSIOLOGY STUDY AND ABLATION  02/07/11  . CARDIOVERSION N/A 03/23/2016   Procedure: CARDIOVERSION;  Surgeon: Evans Lance, MD;  Location: [facility];  Service: Cardiovascular;  Laterality: N/A;  . COLONOSCOPY N/A 04/17/2017   Procedure: COLONOSCOPY;  Surgeon: Rogene Houston, MD;  Location: [facility];  Service: Endoscopy;  Laterality: N/A;  830  . EP  IMPLANTABLE DEVICE N/A 08/29/2015   Procedure: Pacemaker Implant;  Surgeon: Evans Lance, MD;  Location: [facility] CV LAB;  Service: Cardiovascular;  Laterality: N/A;  . EP IMPLANTABLE DEVICE N/A 12/07/2015   Procedure: PPM Lead Revision/Repair;  Surgeon: Evans Lance, MD;  Location: [facility] CV LAB;  Service: Cardiovascular;  Laterality: N/A;  . KNEE ARTHROSCOPY  2005   left  . LUMBAR SPINE SURGERY     "I've had 6 ORs 1972 thru 2004"  . POLYPECTOMY  04/17/2017   Procedure:  POLYPECTOMY;  Surgeon: Rogene Houston, MD;  Location: [facility];  Service: Endoscopy;;  transverse colon x3;  . RIGHT/LEFT HEART CATH AND CORONARY ANGIOGRAPHY N/A 12/21/2019   Procedure: RIGHT/LEFT HEART CATH AND CORONARY ANGIOGRAPHY;  Surgeon: Nelva Bush, MD;  Location: [place] [facility] CV LAB;  Service: Cardiovascular;  Laterality: N/A;  . TEE WITHOUT CARDIOVERSION N/A 11/05/2019   Procedure: TRANSESOPHAGEAL ECHOCARDIOGRAM (TEE) WITH PROPOFOL;  Surgeon: Arnoldo Lenis, MD;  [facility];  Service: Endoscopy;  Laterality: N/A;    Current Outpatient Medications  Medication Sig Dispense Refill  . ACCU-CHEK AVIVA PLUS test strip TEST BLOOD SUGAR TWICE DAILY BEFORE BREAKFAST AND AT BEDTIME 200 strip 1  . apixaban (ELIQUIS) 5 MG TABS tablet Take 1 tablet (5 mg total) by mouth 2 (two) times daily. 180 tablet 3  . benzonatate (TESSALON) 100 MG capsule Take 1 capsule (100 mg total) by mouth 3 (three) times daily as needed for cough. 90 capsule 3  . carvedilol (COREG) 25 MG tablet Take 1 tablet (25 mg total) by mouth 2 (two) times daily with a meal. 180 tablet 3  . HYDROcodone-acetaminophen (NORCO/VICODIN) 5-325 MG tablet Take one tablet po every 4 hrs prn pain (Patient taking differently: Take 1 tablet by mouth every 4 (four) hours as needed for moderate pain. ) 28 tablet 0  . hydrocortisone 2.5 % cream Apply BID to affected area (Patient taking differently: Apply 1 application topically 2 (two) times daily as needed (skin irritation.). ) 60 g 3  . insulin degludec (TRESIBA FLEXTOUCH) 200 UNIT/ML FlexTouch Pen Inject 60 Units into the skin at bedtime. 30 mL 1  . Insulin Pen Needle (BD PEN NEEDLE NANO U/F) 32G X 4 MM MISC 1 each by Does not apply route 4 (four) times daily. 150 each 5  . Multiple Vitamin (MULTIVITAMIN WITH MINERALS) TABS tablet Take 1 tablet by mouth daily. One A Day for Men    . potassium chloride SA (KLOR-CON) 20 MEQ tablet Take 60 meq in the am, at lunch, and in the  evening (Patient taking differently: Take 60 mEq by mouth daily. ) 270 tablet 6  . pravastatin (PRAVACHOL) 20 MG tablet Take 1 tablet (20 mg total) by mouth every evening. 90 tablet 1  . sildenafil (VIAGRA) 50 MG tablet Take 1 tablet (50 mg total) by mouth daily as needed for erectile dysfunction. 30 tablet 3  . spironolactone (ALDACTONE) 25 MG tablet Take 0.5 tablets (12.5 mg total) by mouth daily. 45 tablet 1  . torsemide (DEMADEX) 20 MG tablet TAKE 2 TABLETS (40 MG TOTAL) BY MOUTH 2 (TWO) TIMES DAILY. 360 tablet 1  . TRULICITY 1.5 CW/2.3JS SOPN INJECT 1.5MG (1 PEN) SUBCUTANEOUSLY EVERY WEEK 6 mL 0  . Zinc 50 MG TABS Take 50 mg by mouth daily.     No current facility-administered medications for this visit.   Allergies:  Patient has no known allergies.   Social History: The patient  reports that  he quit smoking about 33 years ago. His smoking use included cigarettes. He has a 10.00 pack-year smoking history. He has never used smokeless tobacco. He reports that he does not drink alcohol and does not use drugs.   Family History: The patient's family history includes Heart attack in his brother, father, and mother; Hypertension in his father and mother.   ROS:  Please see the history of present illness. Otherwise, complete review of systems is positive for none.  All other systems are reviewed and negative.   Physical Exam: VS:  BP 114/80   Pulse 81   Ht 6' (1.829 m)   Wt 223 lb 12.8 oz (101.5 kg)   SpO2 98%   BMI 30.35 kg/m , BMI Body mass index is 30.35 kg/m.  Wt Readings from Last 3 Encounters:  02/02/20 223 lb 12.8 oz (101.5 kg)  01/05/20 223 lb 6.4 oz (101.3 kg)  12/21/19 220 lb (99.8 kg)    General: Patient appears comfortable at rest. Neck: Supple, no elevated JVP or carotid bruits, no thyromegaly. Lungs: Clear to auscultation, nonlabored breathing at rest. Cardiac: Regular rate and rhythm, no S3 or significant systolic murmur, no pericardial rub. Extremities: No pitting  edema, distal pulses 2+. Skin: Warm and dry. Musculoskeletal: No kyphosis. Neuropsychiatric: Alert and oriented x3, affect grossly appropriate.  ECG:  EKG December 21, 2019: Ventricular paced rhythm with frequent premature ventricular complexes rate of 80  Recent Labwork: 10/02/2019: Magnesium 2.4 12/02/2019: ALT 23; AST 39 12/18/2019: Platelets 89 12/21/2019: Hemoglobin 14.6 12/25/2019: BUN 54; Creatinine, Ser 1.73; Potassium 3.5; Sodium 130     Component Value Date/Time   CHOL 178 08/12/2019 0809   CHOL 142 01/17/2018 0835   TRIG 100 08/12/2019 0809   HDL 51 08/12/2019 0809   HDL 53 01/17/2018 0835   CHOLHDL 3.5 08/12/2019 0809   VLDL 20 08/12/2019 0809   LDLCALC 107 (H) 08/12/2019 0809   LDLCALC 71 01/17/2018 0835    Other Studies Reviewed Today:  Echocardiogram: 07/2018 IMPRESSIONS   1. The left ventricle has mildly reduced systolic function, with an ejection fraction of 45-50%. The cavity size was mild to moderately dilated. There is mildly increased left ventricular wall thickness. Left ventricular diastolic Doppler parameters are consistent with pseudonormalization. Left ventrical global hypokinesis without regional wall motion abnormalities. 2. The right ventricle has normal systolic function. The cavity was normal. There is no increase in right ventricular wall thickness. 3. Left atrial size was severely dilated. 4. The aortic valve is tricuspid. Mild thickening of the aortic valve. Moderate calcification of the aortic valve. Mild stenosis of the aortic valve. 5. The ascending aorta is normal in size and structure. 6. The inferior vena cava was dilated in size with <50% respiratory variability.  09/2019 echo  1. Left ventricular ejection fraction, by estimation, is 30 to 35%. The  left ventricle has moderately decreased function. The left ventricle  demonstrates global hypokinesis with abnormal septal motion possibly  consistent with pacing. The left   ventricular internal cavity size was mildly dilated. Left ventricular  diastolic parameters are indeterminate.  2. Right ventricular systolic function is mildly reduced. The right  ventricular size is normal. There is mildly elevated pulmonary artery  systolic pressure. The estimated right ventricular systolic pressure is  78.6 mmHg.  3. Left atrial size was moderately dilated.  4. Right atrial size was moderately dilated.  5. The mitral valve is abnormal, mildly thickened and calcified. Moderate  to severe mitral valve regurgitation, eccentric and posteriorly directed.  Not clear that PISA calculations best define degree of regurgitation.  6. The aortic valve has an indeterminant number of cusps and is severely  calcified. Aortic valve mean gradient measures 15.5 mmHg. Cannot exclude  severe low gradient aortic stenosis with dimentionless index of 0.23.  7. The inferior vena cava is dilated in size with <50% respiratory  variability, suggesting right atrial pressure of 15 mmHg.   Assessment and Plan:  1. Chronic systolic heart failure ([place])   2. Mixed hyperlipidemia   3. Stage 3 chronic kidney disease, unspecified whether stage 3a or 3b CKD ([place])   4. Type 2 diabetes mellitus without complication, with long-term current use of insulin ([place])   5. Atrial flutter, unspecified type ([place])    1. Chronic systolic heart failure (HCC) Denies any recent increase in dyspnea on exertion, weight gain, or lower extremity edema.  Continue carvedilol 25 mg p.o. twice daily.  Spironolactone 12.5 mg p.o. daily.  Continue torsemide 40 mg p.o. twice daily.  2. Mixed hyperlipidemia Continue pravastatin 20 mg p.o. daily.  Lipid panel 08/12/2019: TC 178, TG 100, HDL 51, LDL 107.  3. Stage 3 chronic kidney disease, unspecified whether stage 3a or 3b CKD (HCC) Recent creatinine 1.73, GFR 44.  He follows with Dr. Theador Hawthorne nephrology.  4. Type 2 diabetes mellitus without complication, with long-term  current use of insulin ([place]) States his diabetes has been well controlled.  States recently has been as low as the 60s and sometimes has to take orange juice blood sugar is low.  5.  Atrial flutter Heart rate controlled at 81 today.  Continue to Eliquis 5 mg p.o. twice daily.  Continue carvedilol 25 mg p.o. twice daily.  Medication Adjustments/Labs and Tests Ordered: Current medicines are reviewed at length with the patient today.  Concerns regarding medicines are outlined above.   Disposition: Follow-up with Dr. Harl Bowie or APP 6 months  Signed, Levell July, NP 02/02/2020 10:14 AM    [facility] [place], [place],  [id number] Phone: [phone]; Fax: [phone]

## 2020-02-02 ENCOUNTER — Ambulatory Visit (INDEPENDENT_AMBULATORY_CARE_PROVIDER_SITE_OTHER): Payer: Medicare HMO | Admitting: Family Medicine

## 2020-02-02 ENCOUNTER — Encounter: Payer: Self-pay | Admitting: Family Medicine

## 2020-02-02 VITALS — BP 114/80 | HR 81 | Ht 72.0 in | Wt 223.8 lb

## 2020-02-02 DIAGNOSIS — E119 Type 2 diabetes mellitus without complications: Secondary | ICD-10-CM

## 2020-02-02 DIAGNOSIS — Z794 Long term (current) use of insulin: Secondary | ICD-10-CM

## 2020-02-02 DIAGNOSIS — I4892 Unspecified atrial flutter: Secondary | ICD-10-CM

## 2020-02-02 DIAGNOSIS — N183 Chronic kidney disease, stage 3 unspecified: Secondary | ICD-10-CM | POA: Diagnosis not present

## 2020-02-02 DIAGNOSIS — E782 Mixed hyperlipidemia: Secondary | ICD-10-CM | POA: Diagnosis not present

## 2020-02-02 DIAGNOSIS — I5022 Chronic systolic (congestive) heart failure: Secondary | ICD-10-CM | POA: Diagnosis not present

## 2020-02-02 NOTE — Patient Instructions (Signed)
Medication Instructions:  Continue all current medications.   Labwork: none  Testing/Procedures: none  Follow-Up: 6 months   Any Other Special Instructions Will Be Listed Below (If Applicable).   If you need a refill on your cardiac medications before your next appointment, please call your pharmacy.  

## 2020-02-04 ENCOUNTER — Ambulatory Visit (HOSPITAL_COMMUNITY)
Admission: RE | Admit: 2020-02-04 | Discharge: 2020-02-04 | Disposition: A | Discharge status: Home / Self Care | Payer: Medicare HMO | Source: Ambulatory Visit | Attending: Nephrology | Admitting: Nephrology

## 2020-02-04 ENCOUNTER — Other Ambulatory Visit: Payer: Self-pay

## 2020-02-04 DIAGNOSIS — I129 Hypertensive chronic kidney disease with stage 1 through stage 4 chronic kidney disease, or unspecified chronic kidney disease: Secondary | ICD-10-CM | POA: Insufficient documentation

## 2020-02-04 DIAGNOSIS — E1122 Type 2 diabetes mellitus with diabetic chronic kidney disease: Secondary | ICD-10-CM | POA: Diagnosis not present

## 2020-02-04 DIAGNOSIS — N189 Chronic kidney disease, unspecified: Secondary | ICD-10-CM | POA: Diagnosis not present

## 2020-02-25 ENCOUNTER — Encounter: Payer: Self-pay | Admitting: Nurse Practitioner

## 2020-02-25 ENCOUNTER — Other Ambulatory Visit: Payer: Self-pay

## 2020-02-25 ENCOUNTER — Ambulatory Visit (INDEPENDENT_AMBULATORY_CARE_PROVIDER_SITE_OTHER): Payer: Medicare HMO | Admitting: Nurse Practitioner

## 2020-02-25 VITALS — BP 100/73 | HR 78 | Ht 72.0 in | Wt 221.0 lb

## 2020-02-25 DIAGNOSIS — N189 Chronic kidney disease, unspecified: Secondary | ICD-10-CM | POA: Diagnosis not present

## 2020-02-25 DIAGNOSIS — I10 Essential (primary) hypertension: Secondary | ICD-10-CM | POA: Diagnosis not present

## 2020-02-25 DIAGNOSIS — I129 Hypertensive chronic kidney disease with stage 1 through stage 4 chronic kidney disease, or unspecified chronic kidney disease: Secondary | ICD-10-CM | POA: Diagnosis not present

## 2020-02-25 DIAGNOSIS — E1159 Type 2 diabetes mellitus with other circulatory complications: Secondary | ICD-10-CM | POA: Diagnosis not present

## 2020-02-25 DIAGNOSIS — E782 Mixed hyperlipidemia: Secondary | ICD-10-CM

## 2020-02-25 DIAGNOSIS — E871 Hypo-osmolality and hyponatremia: Secondary | ICD-10-CM | POA: Diagnosis not present

## 2020-02-25 DIAGNOSIS — R809 Proteinuria, unspecified: Secondary | ICD-10-CM | POA: Diagnosis not present

## 2020-02-25 DIAGNOSIS — E1122 Type 2 diabetes mellitus with diabetic chronic kidney disease: Secondary | ICD-10-CM | POA: Diagnosis not present

## 2020-02-25 DIAGNOSIS — I5022 Chronic systolic (congestive) heart failure: Secondary | ICD-10-CM | POA: Diagnosis not present

## 2020-02-25 DIAGNOSIS — D696 Thrombocytopenia, unspecified: Secondary | ICD-10-CM | POA: Diagnosis not present

## 2020-02-25 DIAGNOSIS — E211 Secondary hyperparathyroidism, not elsewhere classified: Secondary | ICD-10-CM | POA: Diagnosis not present

## 2020-02-25 LAB — POCT GLYCOSYLATED HEMOGLOBIN (HGB A1C): HbA1c, POC (controlled diabetic range): 6.9 % (ref 0.0–7.0)

## 2020-02-25 MED ORDER — TRESIBA FLEXTOUCH 200 UNIT/ML ~~LOC~~ SOPN: 50.0000 [IU] | PEN_INJECTOR | Freq: Every day | SUBCUTANEOUS | 1 refills | Status: DC

## 2020-02-25 NOTE — Patient Instructions (Signed)

## 2020-02-25 NOTE — Progress Notes (Signed)
02/25/2020, 2:42 PM   Endocrinology follow-up note    Subjective:    Patient ID: John [name], male    DOB: 1946-01-04.  [name] is here to follow-up for the management of his currently uncontrolled type 2 diabetes, hyperlipidemia.    PMD:  Erven Colla, DO.   Past Medical History:  Diagnosis Date   Atrial flutter ([place]) 12/2010   Admitted with symptomatic bradycardia (HR 40s), atrial flutter with slow ventricular response 12/2010 + volume overload; AV nodal agents d/c'd and Pradaxa started; RFA in 01/2011   CHF (congestive heart failure) (HCC)    Chronic combined systolic and diastolic heart failure ([place])    a. echo 01/06/11: mild LVH, EF 65-70%, mod to severe LAE, mild RVE, mild RAE, PASP 32;   TEE 10/12: EF 45-50% b. EF 45 to 50% by echo in 07/2018   Class 2 severe obesity due to excess calories with serious comorbidity and body mass index (BMI) of 37.0 to 37.9 in adult [facility]) 07/17/2017   Diabetes mellitus    non insulin dependant   Hyperlipidemia    Hypertension    Osteoarthritis    Presence of permanent cardiac pacemaker    PSVT (paroxysmal supraventricular tachycardia) ([place])    Possibly atrial flutter   Past Surgical History:  Procedure Laterality Date   ATRIAL FLUTTER ABLATION N/A 02/07/2011   Procedure: ATRIAL FLUTTER ABLATION;  Surgeon: Evans Lance, MD;  Location: [facility] CATH LAB;  Service: Cardiovascular;  Laterality: N/A;   CARDIAC ELECTROPHYSIOLOGY STUDY AND ABLATION  02/07/11   CARDIOVERSION N/A 03/23/2016   Procedure: CARDIOVERSION;  Surgeon: Evans Lance, MD;  Location: [place];  Service: Cardiovascular;  Laterality: N/A;   COLONOSCOPY N/A 04/17/2017   Procedure: COLONOSCOPY;  Surgeon: Rogene Houston, MD;  [facility];  Service: Endoscopy;  Laterality: N/A;  [place] N/A 08/29/2015   Procedure: Pacemaker  Implant;  Surgeon: Evans Lance, MD;  Location: [place] CV LAB;  Service: Cardiovascular;  Laterality: N/A;   EP IMPLANTABLE DEVICE N/A 12/07/2015   Procedure: PPM Lead Revision/Repair;  Surgeon: Evans Lance, MD;  Location: [place] CV LAB;  Service: Cardiovascular;  Laterality: N/A;   KNEE ARTHROSCOPY  2005   left   LUMBAR SPINE SURGERY     "I've had 6 ORs 1972 thru 2004"   POLYPECTOMY  04/17/2017   Procedure: POLYPECTOMY;  Surgeon: Rogene Houston, MD;  [facility];  Service: Endoscopy;;  transverse colon x3;   RIGHT/LEFT HEART CATH AND CORONARY ANGIOGRAPHY N/A 12/21/2019   Procedure: RIGHT/LEFT HEART CATH AND CORONARY ANGIOGRAPHY;  Surgeon: Nelva Bush, MD;  Location: [place] CV LAB;  Service: Cardiovascular;  Laterality: N/A;   TEE WITHOUT CARDIOVERSION N/A 11/05/2019   Procedure: TRANSESOPHAGEAL ECHOCARDIOGRAM (TEE) WITH PROPOFOL;  Surgeon: Arnoldo Lenis, MD;  [facility];  Service: Endoscopy;  Laterality: N/A;   Social History   Socioeconomic History   Marital status: Married    Spouse name: Not on file   Number of children: Not on file  Years of education: Not on file   Highest education level: Not on file  Occupational History   Occupation: Designer, industrial/product    Employer: RETIRED  Tobacco Use   Smoking status: Former Smoker    Packs/day: 1.00    Years: 10.00    Pack years: 10.00    Types: Cigarettes    Quit date: 03/26/1986    Years since quitting: 33.9   Smokeless tobacco: Never Used   Tobacco comment: "stopped cigarette  smoking 1988"  Vaping Use   Vaping Use: Never used  Substance and Sexual Activity   Alcohol use: Never    Alcohol/week: 0.0 standard drinks    Comment: "quit alcohol ~ 2007"   Drug use: No   Sexual activity: Yes    Partners: Female  Other Topics Concern   Not on file  Social History Narrative   Not on file   Social Determinants of Health   Financial Resource Strain:     Difficulty of Paying Living Expenses: Not on file  Food Insecurity:    Worried About Charity fundraiser in the Last Year: Not on file   [employer] of Food in the Last Year: Not on file  Transportation Needs:    Lack of Transportation (Medical): Not on file   Lack of Transportation (Non-Medical): Not on file  Physical Activity:    Days of Exercise per Week: Not on file   Minutes of Exercise per Session: Not on file  Stress:    Feeling of Stress : Not on file  Social Connections:    Frequency of Communication with Friends and Family: Not on file   Frequency of Social Gatherings with Friends and Family: Not on file   Attends Religious Services: Not on file   Active Member of Clubs or Organizations: Not on file   Attends Archivist Meetings: Not on file   Marital Status: Not on file   Outpatient Encounter Medications as of 02/25/2020  Medication Sig   ACCU-CHEK AVIVA PLUS test strip TEST BLOOD SUGAR TWICE DAILY BEFORE BREAKFAST AND AT BEDTIME   acetaminophen (TYLENOL) 500 MG tablet Take by mouth.   apixaban (ELIQUIS) 5 MG TABS tablet Take 1 tablet (5 mg total) by mouth 2 (two) times daily.   benzonatate (TESSALON) 100 MG capsule Take 1 capsule (100 mg total) by mouth 3 (three) times daily as needed for cough.   carvedilol (COREG) 25 MG tablet Take 1 tablet (25 mg total) by mouth 2 (two) times daily with a meal.   HYDROcodone-acetaminophen (NORCO/VICODIN) 5-325 MG tablet Take one tablet po every 4 hrs prn pain (Patient taking differently: Take 1 tablet by mouth every 4 (four) hours as needed for moderate pain. )   hydrocortisone 2.5 % cream Apply BID to affected area (Patient taking differently: Apply 1 application topically 2 (two) times daily as needed (skin irritation.). )   insulin degludec (TRESIBA FLEXTOUCH) 200 UNIT/ML FlexTouch Pen Inject 50 Units into the skin at bedtime.   Insulin Pen Needle (BD PEN NEEDLE NANO U/F) 32G X 4 MM MISC 1 each by Does not  apply route 4 (four) times daily.   Multiple Vitamin (MULTIVITAMIN WITH MINERALS) TABS tablet Take 1 tablet by mouth daily. One A Day for Men   potassium chloride SA (KLOR-CON) 20 MEQ tablet Take 60 meq in the am, at lunch, and in the evening (Patient taking differently: Take 60 mEq by mouth daily. )   pravastatin (PRAVACHOL) 20 MG tablet Take 1 tablet (  20 mg total) by mouth every evening.   sildenafil (VIAGRA) 50 MG tablet Take 1 tablet (50 mg total) by mouth daily as needed for erectile dysfunction.   spironolactone (ALDACTONE) 25 MG tablet Take 0.5 tablets (12.5 mg total) by mouth daily.   torsemide (DEMADEX) 20 MG tablet TAKE 2 TABLETS (40 MG TOTAL) BY MOUTH 2 (TWO) TIMES DAILY.   TRULICITY 1.5 XV/4.0GQ SOPN INJECT 1.5MG (1 PEN) SUBCUTANEOUSLY EVERY WEEK   Zinc 50 MG TABS Take 50 mg by mouth daily.   [DISCONTINUED] insulin degludec (TRESIBA FLEXTOUCH) 200 UNIT/ML FlexTouch Pen Inject 60 Units into the skin at bedtime.   No facility-administered encounter medications on file as of 02/25/2020.    ALLERGIES: No Known Allergies  VACCINATION STATUS: Immunization History  Administered Date(s) Administered   Influenza,inj,Quad PF,6+ Mos 11/24/2014, 11/29/2015, 12/11/2016, 12/25/2017, 12/20/2018   Influenza-Unspecified 11/24/2013   Moderna SARS-COVID-2 Vaccination 05/03/2019, 06/03/2019, 01/23/2020   Pneumococcal Conjugate-13 11/19/2013   Pneumococcal Polysaccharide-23 11/29/2015   Pneumococcal-Unspecified 03/24/2009   Td 06/23/2010    Diabetes He presents for his follow-up (Telephone visit due to coronavirus pandemic) diabetic visit. He has type 2 diabetes mellitus. Onset time: He was diagnosed at approximate age of 41 years. His disease course has been stable. Hypoglycemia symptoms include nervousness/anxiousness, sweats and tremors. Pertinent negatives for hypoglycemia include no confusion, pallor or seizures. There are no diabetic associated symptoms. Pertinent negatives  for diabetes include no fatigue, no polydipsia, no polyphagia, no polyuria and no weakness. Hypoglycemia complications include nocturnal hypoglycemia. Symptoms are stable. Diabetic complications include heart disease and nephropathy. Risk factors for coronary artery disease include diabetes mellitus, dyslipidemia, family history, obesity, male sex, hypertension, sedentary lifestyle and tobacco exposure. Current diabetic treatment includes insulin injections. He is compliant with treatment most of the time. His weight is fluctuating minimally. He is following a generally unhealthy diet. When asked about meal planning, he reported none. He has not had a previous visit with a dietitian. He never participates in exercise. His home blood glucose trend is fluctuating minimally. His breakfast blood glucose range is generally 70-90 mg/dl. His bedtime blood glucose range is generally 110-130 mg/dl. His overall blood glucose range is 110-130 mg/dl. (He presents today with his meter and logs showing tight fasting and at target postprandial glycemic profile.  His POCT A1c today is 6.9%, increasing from last visit of 6.5%, but still an acceptable goal for him.  He does have some mild fasting hypoglycemia noted.  Analysis of his meter shows 7-day average of 99, 14-day average of 102, and 30-day average of 108.) An ACE inhibitor/angiotensin II receptor blocker is not being taken. He does not see a podiatrist.Eye exam is current.  Hyperlipidemia This is a chronic problem. The current episode started more than 1 year ago. The problem is controlled. Recent lipid tests were reviewed and are high. Exacerbating diseases include chronic renal disease, diabetes and obesity. Factors aggravating his hyperlipidemia include beta blockers. Pertinent negatives include no myalgias. Current antihyperlipidemic treatment includes statins. The current treatment provides mild improvement of lipids. There are no compliance problems.  Risk factors  for coronary artery disease include diabetes mellitus, dyslipidemia, family history, obesity, male sex, hypertension and a sedentary lifestyle.    Review of systems  Constitutional: + Minimally fluctuating body weight, current Body mass index is 29.97 kg/m. , no fatigue, no subjective hyperthermia, no subjective hypothermia Eyes: no blurry vision, no xerophthalmia ENT: no sore throat, no nodules palpated in throat, no dysphagia/odynophagia, no hoarseness Cardiovascular: no chest pain, no  shortness of breath, no palpitations, no leg swelling Respiratory: no cough, no shortness of breath Gastrointestinal: no nausea/vomiting/diarrhea Musculoskeletal: no muscle/joint aches Skin: no rashes, no hyperemia Neurological: no tremors, no numbness, no tingling, no dizziness Psychiatric: no depression, no anxiety   Objective:    BP 100/73 (BP Location: Left Arm)    Pulse 78    Ht 6' (1.829 m)    Wt 221 lb (100.2 kg)    BMI 29.97 kg/m   Wt Readings from Last 3 Encounters:  02/25/20 221 lb (100.2 kg)  02/02/20 223 lb 12.8 oz (101.5 kg)  01/05/20 223 lb 6.4 oz (101.3 kg)    BP Readings from Last 3 Encounters:  02/25/20 100/73  02/02/20 114/80  01/05/20 98/60   Physical Exam- Limited  Constitutional:  Body mass index is 29.97 kg/m. , not in acute distress, normal state of mind Eyes:  EOMI, no exophthalmos Neck: Supple Respiratory: Adequate breathing efforts Musculoskeletal: no gross deformities, strength intact in all four extremities, no gross restriction of joint movements Skin:  no rashes, no hyperemia; Declined foot exam today Neurological: no tremor with outstretched hands.   POCT ABI Results 02/25/20   Right ABI:  1.39      Left ABI:  1.19  Right leg systolic / diastolic: 355/97 mmHg Left leg systolic / diastolic: 416/38 mmHg  Arm systolic / diastolic: 453/64 mmHG  Detailed report will be scanned into patient chart.   CMP     Component Value Date/Time   NA 130 (L)  12/25/2019 0837   NA 137 02/26/2019 1000   K 3.5 12/25/2019 0837   CL 92 (L) 12/25/2019 0837   CO2 27 12/25/2019 0837   GLUCOSE 99 12/25/2019 0837   BUN 54 (H) 12/25/2019 0837   BUN 17 02/26/2019 1000   CREATININE 1.73 (H) 12/25/2019 0837   CREATININE 1.32 (H) 03/07/2016 1043   CALCIUM 9.0 12/25/2019 0837   PROT 8.2 12/02/2019 1215   ALBUMIN 4.1 12/02/2019 1215   AST 39 12/02/2019 1215   ALT 23 12/02/2019 1215   ALKPHOS 197 (H) 12/02/2019 1215   BILITOT 3.2 (H) 12/02/2019 1215   GFRNONAA 38 (L) 12/25/2019 0837   GFRAA 44 (L) 12/25/2019 0837     Diabetic Labs (most recent): Lab Results  Component Value Date   HGBA1C 6.9 02/25/2020   HGBA1C 6.5 (H) 11/03/2019   HGBA1C 7.1 (A) 08/26/2019    Lipid Panel     Component Value Date/Time   CHOL 178 08/12/2019 0809   CHOL 142 01/17/2018 0835   TRIG 100 08/12/2019 0809   HDL 51 08/12/2019 0809   HDL 53 01/17/2018 0835   CHOLHDL 3.5 08/12/2019 0809   VLDL 20 08/12/2019 0809   LDLCALC 107 (H) 08/12/2019 0809   LDLCALC 71 01/17/2018 0835     Lab Results  Component Value Date   TSH 1.940 07/10/2017   TSH 1.563 08/25/2015   TSH 1.206 01/05/2011   FREET4 1.12 07/10/2017        Assessment & Plan:   1) DM type 2 causing vascular disease ([place])  - [name] has currently uncontrolled symptomatic type 2 DM since 74 years of age.  He presents today with his meter and logs showing tight fasting and at target postprandial glycemic profile.  His POCT A1c today is 6.9%, increasing from last visit of 6.5%, but still an acceptable goal for him.  He does have some mild fasting hypoglycemia noted.  Analysis of his meter shows 7-day average of  99, 14-day average of 102, and 30-day average of 108.  Recent labs reviewed.  -his diabetes is complicated by obesity/sedentary life , CKD, CHF , congestive heart failure/bradyarrhythmia and ISAAK DELMUNDO remains at a high risk for more acute and chronic complications which include CAD,  CVA, CKD, retinopathy, and neuropathy. These are all discussed in detail with the patient.  - Nutritional counseling repeated at each appointment due to patients tendency to fall back in to old habits.  - The patient admits there is a room for improvement in their diet and drink choices. -  Suggestion is made for the patient to avoid simple carbohydrates from their diet including Cakes, Sweet Desserts / Pastries, Ice Cream, Soda (diet and regular), Sweet Tea, Candies, Chips, Cookies, Sweet Pastries,  Store Bought Juices, Alcohol in Excess of  1-2 drinks a day, Artificial Sweeteners, Coffee Creamer, and "Sugar-free" Products. This will help patient to have stable blood glucose profile and potentially avoid unintended weight gain.   - I encouraged the patient to switch to  unprocessed or minimally processed complex starch and increased protein intake (animal or plant source), fruits, and vegetables.   - Patient is advised to stick to a routine mealtimes to eat 3 meals  a day and avoid unnecessary snacks ( to snack only to correct hypoglycemia).  - I have approached him with the following individualized plan to manage diabetes and patient agrees:   -Based on his current glycemic response, he will not require prandial insulin for now.    -To avoid the randomly occurring hypoglycemia, he is advised to lower his Tyler Aas again to 50 units SQ daily at bedtime.  He is advised to continue Trulicity 1.5 mg SQ weekly.    -He is encouraged to continue monitoring blood glucose twice daily, before breakfast and before bed, and to call the clinic if he has readings less than 70 or greater than 200 for 3 tests in a row.  2) Lipids/HPL:   His most recent lipid panel from 08/12/19 shows uncontrolled LDL at 107.  He is advised to continue Pravastatin 20 mg po daily at bedtime.  Side effects and precautions discussed with him.  3) Hypertension- -His blood pressure is controlled to target.  He is advised to continue  Coreg 25 mg po twice daily, Spironolactone 12.5 mg po daily, and Demadex 40 mg po twice daily per his cardiologist/PCP.  4) Weight management:  His Body mass index is 29.97 kg/m.- not a candidate for major weight loss.  Specific carbs and exercise regimen were discussed in detail with him.   He is advised to continue follow-up with his cardiologist.  - I advised patient to maintain close follow up with Erven Colla, DO for primary care needs.  - Time spent on this patient care encounter:  35 min, of which > 50% was spent in  counseling and the rest reviewing his blood glucose logs , discussing his hypoglycemia and hyperglycemia episodes, reviewing his current and  previous labs / studies  ( including abstraction from other facilities) and medications  doses and developing a  long term treatment plan and documenting his care.   Please refer to Patient Instructions for Blood Glucose Monitoring and Insulin/Medications Dosing Guide"  in media tab for additional information. Please  also refer to " Patient Self Inventory" in the Media  tab for reviewed elements of pertinent patient history.  [name] participated in the discussions, expressed understanding, and voiced agreement with the above plans.  All questions were answered to his satisfaction. he is encouraged to contact clinic should he have any questions or concerns prior to his return visit.    Follow up plan: - Return in about 4 months (around 06/25/2020) for Diabetes follow up with A1c in office, No previsit labs, Bring glucometer and logs, ABI next visit.  Rayetta Pigg, [facility] [address] [place], [place] [address] Phone: [phone] Fax: [phone]  02/25/2020, 2:42 PM

## 2020-02-26 ENCOUNTER — Encounter (INDEPENDENT_AMBULATORY_CARE_PROVIDER_SITE_OTHER): Payer: Self-pay | Admitting: *Deleted

## 2020-03-03 ENCOUNTER — Encounter (INDEPENDENT_AMBULATORY_CARE_PROVIDER_SITE_OTHER): Payer: Self-pay | Admitting: Gastroenterology

## 2020-03-03 ENCOUNTER — Ambulatory Visit (INDEPENDENT_AMBULATORY_CARE_PROVIDER_SITE_OTHER): Payer: Medicare HMO | Admitting: Gastroenterology

## 2020-03-03 ENCOUNTER — Other Ambulatory Visit: Payer: Self-pay

## 2020-03-03 DIAGNOSIS — R7989 Other specified abnormal findings of blood chemistry: Secondary | ICD-10-CM | POA: Insufficient documentation

## 2020-03-03 DIAGNOSIS — K5900 Constipation, unspecified: Secondary | ICD-10-CM | POA: Insufficient documentation

## 2020-03-03 DIAGNOSIS — K7581 Nonalcoholic steatohepatitis (NASH): Secondary | ICD-10-CM | POA: Diagnosis not present

## 2020-03-03 NOTE — Patient Instructions (Signed)
Start taking Miralax 1 pack every 12 hours. If after two weeks there is no improvement, increase to 1 packs every 8 hours Perform blood workup Schedule liver elastography Explained to the patient the etiology and consequences of his current liver disease. Patient was counseled about the benefit of implementing a Mediterranean diet and exercise plan to decrease at least 7.5% of weight (16 lb). The patient understood about the importance of lifestyle changes to potentially reverse his liver involvement.

## 2020-03-03 NOTE — Progress Notes (Signed)
John [name], M.D. Gastroenterology & Hepatology [facility] [address] [place], [place] [address]  Primary Care Physician: Erven Colla, DO [place] [id number]  I will communicate my assessment and recommendations to the referring MD via EMR. "Note: Occasional unusual wording and randomly placed punctuation marks may result from the use of speech recognition technology to transcribe this document"  Problems: 1. Elevated liver enzymes 2. NASH 3. Constipation  History of Present Illness: John [name] is a 74 y.o. male with PMH afib on Eliquis, CHF, DM on insulin, HTN, HLD  who presents for follow up of NASH, elevated liver enzymes and constipation.  The patient was last seen on 11/16/2019. At that time, the patient was ordered blood testing for evaluation of elevated aminotransferases and mildly elevated alkaline phosphatase.Marland Kitchen  His most recent chemistries from 12/02/2019 showed AST 39, ALT 23, total bilirubin 3.2, alkaline phosphatase 197 creatinine 1.63, BUN 43, sodium 134 rest of electrolytes within normal limits, white blood cell count 6.5, hemoglobin 14.3 and platelets 89, had normal iron panel negative mitochondrial and smooth muscle antibodies, negative hepatitis B serologies, hepatitis C antibody, ANA was negative.  Liver ultrasound performed on 11/24/2019 showed cholelithiasis without cholecystitis, fatty liver.  Patient states his only complaint is constipation as he has a bowel movement every day but is very hard. Takes a stool softener which he started a few days ago. Tried Miralax 1 pack per day without any improvement. The patient denies having any other complaints, has not presented any nausea, vomiting, fever, chills, hematochezia, melena, hematemesis, abdominal distention, abdominal pain, diarrhea, jaundice, pruritus. Has lost close to 50 lb as he has decreased the amount of food on purpose.  Last  Colonoscopy: 2019 - 3 polyps in [facility], diverticulosis.  Past Medical History: Past Medical History:  Diagnosis Date  . Atrial flutter ([place]) 12/2010   Admitted with symptomatic bradycardia (HR 40s), atrial flutter with slow ventricular response 12/2010 + volume overload; AV nodal agents d/c'd and Pradaxa started; RFA in 01/2011  . CHF (congestive heart failure) ([place])   . Chronic combined systolic and diastolic heart failure ([place])    a. echo 01/06/11: mild LVH, EF 65-70%, mod to severe LAE, mild RVE, mild RAE, PASP 32;   TEE 10/12: EF 45-50% b. EF 45 to 50% by echo in 07/2018  . Class 2 severe obesity due to excess calories with serious comorbidity and body mass index (BMI) of 37.0 to 37.9 in adult ([place]) 07/17/2017  . Diabetes mellitus    non insulin dependant  . Hyperlipidemia   . Hypertension   . Osteoarthritis   . Presence of permanent cardiac pacemaker   . PSVT (paroxysmal supraventricular tachycardia) ([facility])    Possibly atrial flutter    Past Surgical History: Past Surgical History:  Procedure Laterality Date  . ATRIAL FLUTTER ABLATION N/A 02/07/2011   Procedure: ATRIAL FLUTTER ABLATION;  Surgeon: Evans Lance, MD;  Location: [facility] CATH LAB;  Service: Cardiovascular;  Laterality: N/A;  . CARDIAC ELECTROPHYSIOLOGY STUDY AND ABLATION  02/07/11  . CARDIOVERSION N/A 03/23/2016   Procedure: CARDIOVERSION;  Surgeon: Evans Lance, MD;  Location: [place];  Service: Cardiovascular;  Laterality: N/A;  . COLONOSCOPY N/A 04/17/2017   Procedure: COLONOSCOPY;  Surgeon: Rogene Houston, MD;  [facility];  Service: Endoscopy;  Laterality: N/A;  830  . EP IMPLANTABLE DEVICE N/A 08/29/2015   Procedure: Pacemaker Implant;  Surgeon: Evans Lance, MD;  Location: [facility]  INVASIVE CV LAB;  Service: Cardiovascular;  Laterality: N/A;  . EP IMPLANTABLE DEVICE N/A 12/07/2015   Procedure: PPM Lead Revision/Repair;  Surgeon: Evans Lance, MD;  Location: [place] CV LAB;  Service: Cardiovascular;   Laterality: N/A;  . KNEE ARTHROSCOPY  2005   left  . LUMBAR SPINE SURGERY     "I've had 6 ORs 1972 thru 2004"  . POLYPECTOMY  04/17/2017   Procedure: POLYPECTOMY;  Surgeon: Rogene Houston, MD;  [facility];  Service: Endoscopy;;  transverse colon x3;  . RIGHT/LEFT HEART CATH AND CORONARY ANGIOGRAPHY N/A 12/21/2019   Procedure: RIGHT/LEFT HEART CATH AND CORONARY ANGIOGRAPHY;  Surgeon: Nelva Bush, MD;  Location: [facility] CV LAB;  Service: Cardiovascular;  Laterality: N/A;  . TEE WITHOUT CARDIOVERSION N/A 11/05/2019   Procedure: TRANSESOPHAGEAL ECHOCARDIOGRAM (TEE) WITH PROPOFOL;  Surgeon: Arnoldo Lenis, MD;  [facility];  Service: Endoscopy;  Laterality: N/A;    Family History: Family History  Problem Relation Age of Onset  . Heart attack Mother   . Hypertension Mother   . Heart attack Father   . Hypertension Father   . Heart attack Brother   . Colon cancer Neg Hx     Social History: Social History   Tobacco Use  Smoking Status Former Smoker  . Packs/day: 1.00  . Years: 10.00  . Pack years: 10.00  . Types: Cigarettes  . Quit date: 03/26/1986  . Years since quitting: 33.9  Smokeless Tobacco Never Used  Tobacco Comment   "stopped cigarette  smoking 1988"   Social History   Substance and Sexual Activity  Alcohol Use Never  . Alcohol/week: 0.0 standard drinks   Comment: "quit alcohol ~ 2007"   Social History   Substance and Sexual Activity  Drug Use No    Allergies: No Known Allergies  Medications: Current Outpatient Medications  Medication Sig Dispense Refill  . ACCU-CHEK AVIVA PLUS test strip TEST BLOOD SUGAR TWICE DAILY BEFORE BREAKFAST AND AT BEDTIME 200 strip 1  . acetaminophen (TYLENOL) 500 MG tablet Take by mouth.    Marland Kitchen apixaban (ELIQUIS) 5 MG TABS tablet Take 1 tablet (5 mg total) by mouth 2 (two) times daily. 180 tablet 3  . benzonatate (TESSALON) 100 MG capsule Take 1 capsule (100 mg total) by mouth 3 (three) times  daily as needed for cough. 90 capsule 3  . carvedilol (COREG) 25 MG tablet Take 1 tablet (25 mg total) by mouth 2 (two) times daily with a meal. 180 tablet 3  . HYDROcodone-acetaminophen (NORCO/VICODIN) 5-325 MG tablet Take one tablet po every 4 hrs prn pain (Patient taking differently: Take 1 tablet by mouth every 4 (four) hours as needed for moderate pain.) 28 tablet 0  . hydrocortisone 2.5 % cream Apply BID to affected area (Patient taking differently: Apply 1 application topically 2 (two) times daily as needed (skin irritation.).) 60 g 3  . insulin degludec (TRESIBA FLEXTOUCH) 200 UNIT/ML FlexTouch Pen Inject 50 Units into the skin at bedtime. (Patient taking differently: Inject 60 Units into the skin at bedtime.) 30 mL 1  . Insulin Pen Needle (BD PEN NEEDLE NANO U/F) 32G X 4 MM MISC 1 each by Does not apply route 4 (four) times daily. 150 each 5  . Multiple Vitamin (MULTIVITAMIN WITH MINERALS) TABS tablet Take 1 tablet by mouth daily. One A Day for Men    . potassium chloride SA (KLOR-CON) 20 MEQ tablet Take 60 meq in the am, at lunch, and in  the evening (Patient taking differently: Take 60 mEq by mouth daily.) 270 tablet 6  . pravastatin (PRAVACHOL) 20 MG tablet Take 1 tablet (20 mg total) by mouth every evening. 90 tablet 1  . sildenafil (VIAGRA) 50 MG tablet Take 1 tablet (50 mg total) by mouth daily as needed for erectile dysfunction. 30 tablet 3  . spironolactone (ALDACTONE) 25 MG tablet Take 0.5 tablets (12.5 mg total) by mouth daily. 45 tablet 1  . torsemide (DEMADEX) 20 MG tablet TAKE 2 TABLETS (40 MG TOTAL) BY MOUTH 2 (TWO) TIMES DAILY. 360 tablet 1  . TRULICITY 1.5 EV/0.3JK SOPN INJECT 1.5MG (1 PEN) SUBCUTANEOUSLY EVERY WEEK 6 mL 0  . Zinc 50 MG TABS Take 50 mg by mouth daily.     No current facility-administered medications for this visit.    Review of Systems: GENERAL: negative for malaise, night sweats HEENT: No changes in hearing or vision, no nose bleeds or other nasal  problems. NECK: Negative for lumps, goiter, pain and significant neck swelling RESPIRATORY: Negative for cough, wheezing CARDIOVASCULAR: Negative for chest pain, leg swelling, palpitations, orthopnea GI: SEE HPI MUSCULOSKELETAL: Negative for joint pain or swelling, back pain, and muscle pain. SKIN: Negative for lesions, rash PSYCH: Negative for sleep disturbance, mood disorder and recent psychosocial stressors. HEMATOLOGY Negative for prolonged bleeding, bruising easily, and swollen nodes. ENDOCRINE: Negative for cold or heat intolerance, polyuria, polydipsia and goiter. NEURO: negative for tremor, gait imbalance, syncope and seizures. The remainder of the review of systems is noncontributory.   Physical Exam: BP 95/65 (BP Location: Left Arm, Patient Position: Sitting, Cuff Size: Large)   Pulse 87   Temp (!) 97.4 F (36.3 C) (Oral)   Ht 6' (1.829 m)   Wt 227 lb (103 kg)   BMI 30.79 kg/m  GENERAL: The patient is AO x3, in no acute distress. HEENT: Head is normocephalic and atraumatic. EOMI are intact. Mouth is well hydrated and without lesions. NECK: Supple. No masses LUNGS: Clear to auscultation. No presence of rhonchi/wheezing/rales. Adequate chest expansion HEART: RRR, normal s1 and s2. ABDOMEN: Soft, nontender, no guarding, no peritoneal signs, and nondistended. BS +. No masses. EXTREMITIES: Without any cyanosis, clubbing, rash, lesions or edema. NEUROLOGIC: AOx3, no focal motor deficit. SKIN: no jaundice, no rashes  Imaging/Labs: as above  I personally reviewed and interpreted the available labs, imaging and endoscopic files.  Impression and Plan: John [name] is a 74 y.o. male with PMH afib on Eliquis, CHF, DM on insulin, HTN, HLD  who presents for follow up of NASH, elevated liver enzymes and constipation.  In terms of his elevated liver enzymes, his alterations are likely related to NASH.  Particularly there is a predominance of the endoscopic submucosal dissection  elevation over the ALT, which along his low platelets and elevated INR in the past raising concern for possible cirrhosis.  His NAFLD score is 2.90, which also makes it more consistent with advanced fibrosis.  Due to this, we will proceed with a liver elastography and will check MELD labs, will also check IgG and AFP today.  I advised the patient to proceed with hepatitis B vaccination which she will request from his PCP.  Overall, his best intervention will be to lose weight which she has been working on, I encouraged to keep losing weight.  Finally, I advised him to increase the intake of MiraLAX to every 12 hours dosing to improve his bowel movements.  - Start taking Miralax 1 pack every 12 hours. If after two  weeks there is no improvement, increase to 1 packs every 8 hours - Check anti mitochondrial ab , IgG, hep A, INR, CBC, CMP, AFP - Schedule liver elastography - Explained to the patient the etiology and consequences of his current liver disease. Patient was counseled about the benefit of implementing a Mediterranean diet and exercise plan to decrease at least 7.5% of weight (16 lb). The patient understood about the importance of lifestyle changes to potentially reverse his liver involvement. - Liver elastography - Advised to take Hep B vaccine - RTC 4 months  All questions were answered.      Harvel Quale, MD Gastroenterology and Hepatology [facility]

## 2020-03-09 ENCOUNTER — Inpatient Hospital Stay (HOSPITAL_COMMUNITY)
Admission: EM | Admit: 2020-03-09 | Discharge: 2020-03-14 | DRG: 193 | Disposition: A | Discharge status: Home Health | Payer: Medicare HMO | Attending: Internal Medicine | Admitting: Internal Medicine

## 2020-03-09 ENCOUNTER — Other Ambulatory Visit: Payer: Self-pay

## 2020-03-09 ENCOUNTER — Emergency Department (HOSPITAL_COMMUNITY): Payer: Medicare HMO

## 2020-03-09 ENCOUNTER — Encounter (HOSPITAL_COMMUNITY): Payer: Self-pay

## 2020-03-09 DIAGNOSIS — I5022 Chronic systolic (congestive) heart failure: Secondary | ICD-10-CM | POA: Diagnosis present

## 2020-03-09 DIAGNOSIS — J181 Lobar pneumonia, unspecified organism: Principal | ICD-10-CM | POA: Diagnosis present

## 2020-03-09 DIAGNOSIS — R748 Abnormal levels of other serum enzymes: Secondary | ICD-10-CM | POA: Diagnosis not present

## 2020-03-09 DIAGNOSIS — E441 Mild protein-calorie malnutrition: Secondary | ICD-10-CM | POA: Diagnosis present

## 2020-03-09 DIAGNOSIS — K8020 Calculus of gallbladder without cholecystitis without obstruction: Secondary | ICD-10-CM | POA: Diagnosis not present

## 2020-03-09 DIAGNOSIS — E782 Mixed hyperlipidemia: Secondary | ICD-10-CM | POA: Diagnosis present

## 2020-03-09 DIAGNOSIS — R778 Other specified abnormalities of plasma proteins: Secondary | ICD-10-CM | POA: Diagnosis present

## 2020-03-09 DIAGNOSIS — Z66 Do not resuscitate: Secondary | ICD-10-CM | POA: Diagnosis present

## 2020-03-09 DIAGNOSIS — Z7901 Long term (current) use of anticoagulants: Secondary | ICD-10-CM

## 2020-03-09 DIAGNOSIS — E860 Dehydration: Secondary | ICD-10-CM

## 2020-03-09 DIAGNOSIS — K7581 Nonalcoholic steatohepatitis (NASH): Secondary | ICD-10-CM | POA: Diagnosis not present

## 2020-03-09 DIAGNOSIS — K8000 Calculus of gallbladder with acute cholecystitis without obstruction: Secondary | ICD-10-CM | POA: Diagnosis present

## 2020-03-09 DIAGNOSIS — R9431 Abnormal electrocardiogram [ECG] [EKG]: Secondary | ICD-10-CM

## 2020-03-09 DIAGNOSIS — E871 Hypo-osmolality and hyponatremia: Secondary | ICD-10-CM | POA: Diagnosis present

## 2020-03-09 DIAGNOSIS — K4020 Bilateral inguinal hernia, without obstruction or gangrene, not specified as recurrent: Secondary | ICD-10-CM | POA: Diagnosis not present

## 2020-03-09 DIAGNOSIS — R404 Transient alteration of awareness: Secondary | ICD-10-CM | POA: Diagnosis not present

## 2020-03-09 DIAGNOSIS — N179 Acute kidney failure, unspecified: Secondary | ICD-10-CM | POA: Diagnosis present

## 2020-03-09 DIAGNOSIS — E8809 Other disorders of plasma-protein metabolism, not elsewhere classified: Secondary | ICD-10-CM | POA: Diagnosis present

## 2020-03-09 DIAGNOSIS — R238 Other skin changes: Secondary | ICD-10-CM | POA: Diagnosis present

## 2020-03-09 DIAGNOSIS — J9601 Acute respiratory failure with hypoxia: Secondary | ICD-10-CM

## 2020-03-09 DIAGNOSIS — J189 Pneumonia, unspecified organism: Secondary | ICD-10-CM

## 2020-03-09 DIAGNOSIS — I5043 Acute on chronic combined systolic (congestive) and diastolic (congestive) heart failure: Secondary | ICD-10-CM | POA: Diagnosis present

## 2020-03-09 DIAGNOSIS — R1012 Left upper quadrant pain: Secondary | ICD-10-CM | POA: Diagnosis not present

## 2020-03-09 DIAGNOSIS — N189 Chronic kidney disease, unspecified: Secondary | ICD-10-CM

## 2020-03-09 DIAGNOSIS — Z8249 Family history of ischemic heart disease and other diseases of the circulatory system: Secondary | ICD-10-CM

## 2020-03-09 DIAGNOSIS — I959 Hypotension, unspecified: Secondary | ICD-10-CM | POA: Diagnosis not present

## 2020-03-09 DIAGNOSIS — K7460 Unspecified cirrhosis of liver: Secondary | ICD-10-CM | POA: Diagnosis present

## 2020-03-09 DIAGNOSIS — I4892 Unspecified atrial flutter: Secondary | ICD-10-CM | POA: Diagnosis not present

## 2020-03-09 DIAGNOSIS — I480 Paroxysmal atrial fibrillation: Secondary | ICD-10-CM | POA: Diagnosis present

## 2020-03-09 DIAGNOSIS — E7220 Disorder of urea cycle metabolism, unspecified: Secondary | ICD-10-CM | POA: Diagnosis not present

## 2020-03-09 DIAGNOSIS — R4182 Altered mental status, unspecified: Secondary | ICD-10-CM | POA: Diagnosis not present

## 2020-03-09 DIAGNOSIS — I10 Essential (primary) hypertension: Secondary | ICD-10-CM

## 2020-03-09 DIAGNOSIS — E1159 Type 2 diabetes mellitus with other circulatory complications: Secondary | ICD-10-CM | POA: Diagnosis not present

## 2020-03-09 DIAGNOSIS — K810 Acute cholecystitis: Secondary | ICD-10-CM

## 2020-03-09 DIAGNOSIS — M1990 Unspecified osteoarthritis, unspecified site: Secondary | ICD-10-CM | POA: Diagnosis present

## 2020-03-09 DIAGNOSIS — N1832 Chronic kidney disease, stage 3b: Secondary | ICD-10-CM | POA: Diagnosis not present

## 2020-03-09 DIAGNOSIS — Z87891 Personal history of nicotine dependence: Secondary | ICD-10-CM

## 2020-03-09 DIAGNOSIS — Z20822 Contact with and (suspected) exposure to covid-19: Secondary | ICD-10-CM | POA: Diagnosis not present

## 2020-03-09 DIAGNOSIS — I130 Hypertensive heart and chronic kidney disease with heart failure and stage 1 through stage 4 chronic kidney disease, or unspecified chronic kidney disease: Secondary | ICD-10-CM | POA: Diagnosis present

## 2020-03-09 DIAGNOSIS — D696 Thrombocytopenia, unspecified: Secondary | ICD-10-CM | POA: Diagnosis not present

## 2020-03-09 DIAGNOSIS — I6782 Cerebral ischemia: Secondary | ICD-10-CM | POA: Diagnosis not present

## 2020-03-09 DIAGNOSIS — R21 Rash and other nonspecific skin eruption: Secondary | ICD-10-CM

## 2020-03-09 DIAGNOSIS — I442 Atrioventricular block, complete: Secondary | ICD-10-CM | POA: Diagnosis present

## 2020-03-09 DIAGNOSIS — Z86010 Personal history of colonic polyps: Secondary | ICD-10-CM

## 2020-03-09 DIAGNOSIS — Z6829 Body mass index (BMI) 29.0-29.9, adult: Secondary | ICD-10-CM

## 2020-03-09 DIAGNOSIS — J188 Other pneumonia, unspecified organism: Secondary | ICD-10-CM | POA: Diagnosis not present

## 2020-03-09 DIAGNOSIS — R7401 Elevation of levels of liver transaminase levels: Secondary | ICD-10-CM

## 2020-03-09 DIAGNOSIS — R0902 Hypoxemia: Secondary | ICD-10-CM | POA: Diagnosis not present

## 2020-03-09 DIAGNOSIS — I4819 Other persistent atrial fibrillation: Secondary | ICD-10-CM | POA: Diagnosis present

## 2020-03-09 DIAGNOSIS — K5730 Diverticulosis of large intestine without perforation or abscess without bleeding: Secondary | ICD-10-CM | POA: Diagnosis not present

## 2020-03-09 DIAGNOSIS — Z7189 Other specified counseling: Secondary | ICD-10-CM

## 2020-03-09 DIAGNOSIS — E1122 Type 2 diabetes mellitus with diabetic chronic kidney disease: Secondary | ICD-10-CM | POA: Diagnosis present

## 2020-03-09 DIAGNOSIS — J811 Chronic pulmonary edema: Secondary | ICD-10-CM | POA: Diagnosis not present

## 2020-03-09 DIAGNOSIS — R0602 Shortness of breath: Secondary | ICD-10-CM | POA: Diagnosis not present

## 2020-03-09 DIAGNOSIS — R1011 Right upper quadrant pain: Secondary | ICD-10-CM

## 2020-03-09 DIAGNOSIS — Z794 Long term (current) use of insulin: Secondary | ICD-10-CM

## 2020-03-09 DIAGNOSIS — Z950 Presence of cardiac pacemaker: Secondary | ICD-10-CM

## 2020-03-09 DIAGNOSIS — Z79899 Other long term (current) drug therapy: Secondary | ICD-10-CM

## 2020-03-09 DIAGNOSIS — I4821 Permanent atrial fibrillation: Secondary | ICD-10-CM

## 2020-03-09 DIAGNOSIS — G319 Degenerative disease of nervous system, unspecified: Secondary | ICD-10-CM | POA: Diagnosis not present

## 2020-03-09 DIAGNOSIS — R110 Nausea: Secondary | ICD-10-CM | POA: Diagnosis not present

## 2020-03-09 DIAGNOSIS — N184 Chronic kidney disease, stage 4 (severe): Secondary | ICD-10-CM | POA: Diagnosis not present

## 2020-03-09 DIAGNOSIS — E861 Hypovolemia: Secondary | ICD-10-CM | POA: Diagnosis not present

## 2020-03-09 LAB — CBC WITH DIFFERENTIAL/PLATELET
Abs Immature Granulocytes: 0.04 10*3/uL (ref 0.00–0.07)
Basophils Absolute: 0.1 10*3/uL (ref 0.0–0.1)
Basophils Relative: 1 %
Eosinophils Absolute: 0.1 10*3/uL (ref 0.0–0.5)
Eosinophils Relative: 1 %
HCT: 41.4 % (ref 39.0–52.0)
Hemoglobin: 13.9 g/dL (ref 13.0–17.0)
Immature Granulocytes: 0 %
Lymphocytes Relative: 7 %
Lymphs Abs: 0.7 10*3/uL (ref 0.7–4.0)
MCH: 30.8 pg (ref 26.0–34.0)
MCHC: 33.6 g/dL (ref 30.0–36.0)
MCV: 91.6 fL (ref 80.0–100.0)
Monocytes Absolute: 0.5 10*3/uL (ref 0.1–1.0)
Monocytes Relative: 6 %
Neutro Abs: 8.1 10*3/uL — ABNORMAL HIGH (ref 1.7–7.7)
Neutrophils Relative %: 85 %
Platelets: 101 10*3/uL — ABNORMAL LOW (ref 150–400)
RBC: 4.52 MIL/uL (ref 4.22–5.81)
RDW: 17.3 % — ABNORMAL HIGH (ref 11.5–15.5)
WBC: 9.5 10*3/uL (ref 4.0–10.5)
nRBC: 0 % (ref 0.0–0.2)

## 2020-03-09 LAB — COMPREHENSIVE METABOLIC PANEL
ALT: 25 U/L (ref 0–44)
AST: 57 U/L — ABNORMAL HIGH (ref 15–41)
Albumin: 3.4 g/dL — ABNORMAL LOW (ref 3.5–5.0)
Alkaline Phosphatase: 167 U/L — ABNORMAL HIGH (ref 38–126)
Anion gap: 13 (ref 5–15)
BUN: 70 mg/dL — ABNORMAL HIGH (ref 8–23)
CO2: 25 mmol/L (ref 22–32)
Calcium: 8.9 mg/dL (ref 8.9–10.3)
Chloride: 91 mmol/L — ABNORMAL LOW (ref 98–111)
Creatinine, Ser: 2.38 mg/dL — ABNORMAL HIGH (ref 0.61–1.24)
GFR, Estimated: 28 mL/min — ABNORMAL LOW (ref >60)
Glucose, Bld: 92 mg/dL (ref 70–99)
Potassium: 3.6 mmol/L (ref 3.5–5.1)
Sodium: 129 mmol/L — ABNORMAL LOW (ref 135–145)
Total Bilirubin: 4.9 mg/dL — ABNORMAL HIGH (ref 0.3–1.2)
Total Protein: 7.9 g/dL (ref 6.5–8.1)

## 2020-03-09 LAB — RESP PANEL BY RT-PCR (FLU A&B, COVID) ARPGX2
Influenza A by PCR: NEGATIVE
Influenza B by PCR: NEGATIVE
SARS Coronavirus 2 by RT PCR: NEGATIVE

## 2020-03-09 LAB — CBG MONITORING, ED
Glucose-Capillary: 110 mg/dL — ABNORMAL HIGH (ref 70–99)
Glucose-Capillary: 88 mg/dL (ref 70–99)

## 2020-03-09 LAB — TROPONIN I (HIGH SENSITIVITY)
Troponin I (High Sensitivity): 39 ng/L — ABNORMAL HIGH (ref <18)
Troponin I (High Sensitivity): 39 ng/L — ABNORMAL HIGH (ref <18)

## 2020-03-09 LAB — BRAIN NATRIURETIC PEPTIDE: B Natriuretic Peptide: 1540 pg/mL — ABNORMAL HIGH (ref 0.0–100.0)

## 2020-03-09 LAB — AMMONIA: Ammonia: 64 umol/L — ABNORMAL HIGH (ref 9–35)

## 2020-03-09 LAB — LIPASE, BLOOD: Lipase: 37 U/L (ref 11–51)

## 2020-03-09 MED ORDER — MORPHINE SULFATE (PF) 4 MG/ML IV SOLN
2.0000 mg | Freq: Once | INTRAVENOUS | Status: AC
  Administered 2020-03-09: 17:00:00 2 mg via INTRAVENOUS
  Filled 2020-03-09: qty 1

## 2020-03-09 MED ORDER — FUROSEMIDE 10 MG/ML IJ SOLN
40.0000 mg | Freq: Once | INTRAMUSCULAR | Status: AC
  Administered 2020-03-09: 17:00:00 40 mg via INTRAVENOUS
  Filled 2020-03-09: qty 4

## 2020-03-09 MED ORDER — SODIUM CHLORIDE 0.9 % IV SOLN
100.0000 mg | Freq: Two times a day (BID) | INTRAVENOUS | Status: AC
  Administered 2020-03-10 – 2020-03-13 (×7): 100 mg via INTRAVENOUS
  Filled 2020-03-09 (×9): qty 100

## 2020-03-09 MED ORDER — SODIUM CHLORIDE 0.9 % IV BOLUS
1000.0000 mL | Freq: Once | INTRAVENOUS | Status: AC
  Administered 2020-03-09: 20:00:00 1000 mL via INTRAVENOUS

## 2020-03-09 MED ORDER — SODIUM CHLORIDE 0.9 % IV SOLN
1.0000 g | Freq: Once | INTRAVENOUS | Status: AC
  Administered 2020-03-09: 17:00:00 1 g via INTRAVENOUS
  Filled 2020-03-09: qty 10

## 2020-03-09 MED ORDER — PIPERACILLIN-TAZOBACTAM 3.375 G IVPB 30 MIN
3.3750 g | Freq: Once | INTRAVENOUS | Status: AC
  Administered 2020-03-09: 19:00:00 3.375 g via INTRAVENOUS
  Filled 2020-03-09: qty 50

## 2020-03-09 MED ORDER — INSULIN ASPART 100 UNIT/ML ~~LOC~~ SOLN
0.0000 [IU] | Freq: Every day | SUBCUTANEOUS | Status: DC
  Administered 2020-03-11 – 2020-03-12 (×2): 3 [IU] via SUBCUTANEOUS

## 2020-03-09 MED ORDER — INSULIN ASPART 100 UNIT/ML ~~LOC~~ SOLN
0.0000 [IU] | Freq: Three times a day (TID) | SUBCUTANEOUS | Status: DC
  Administered 2020-03-10: 12:00:00 3 [IU] via SUBCUTANEOUS
  Administered 2020-03-10: 09:00:00 1 [IU] via SUBCUTANEOUS
  Administered 2020-03-10 – 2020-03-11 (×3): 3 [IU] via SUBCUTANEOUS
  Administered 2020-03-11: 08:00:00 2 [IU] via SUBCUTANEOUS
  Administered 2020-03-12: 17:00:00 3 [IU] via SUBCUTANEOUS
  Administered 2020-03-12 – 2020-03-13 (×3): 2 [IU] via SUBCUTANEOUS
  Administered 2020-03-13 (×2): 3 [IU] via SUBCUTANEOUS
  Filled 2020-03-09 (×2): qty 1

## 2020-03-09 MED ORDER — SODIUM CHLORIDE 0.9 % IV BOLUS
1000.0000 mL | Freq: Once | INTRAVENOUS | Status: AC
  Administered 2020-03-09: 18:00:00 1000 mL via INTRAVENOUS

## 2020-03-09 MED ORDER — PROCHLORPERAZINE EDISYLATE 10 MG/2ML IJ SOLN: 10.0000 mg | Freq: Four times a day (QID) | INTRAMUSCULAR | Status: DC | PRN

## 2020-03-09 MED ORDER — CALAMINE EX LOTN
TOPICAL_LOTION | Freq: Two times a day (BID) | CUTANEOUS | Status: DC
  Filled 2020-03-09 (×2): qty 177

## 2020-03-09 MED ORDER — APIXABAN 5 MG PO TABS
5.0000 mg | ORAL_TABLET | Freq: Two times a day (BID) | ORAL | Status: DC
  Administered 2020-03-09 – 2020-03-14 (×10): 5 mg via ORAL
  Filled 2020-03-09 (×10): qty 1

## 2020-03-09 MED ORDER — ONDANSETRON HCL 4 MG/2ML IJ SOLN
4.0000 mg | Freq: Once | INTRAMUSCULAR | Status: AC
  Administered 2020-03-09: 17:00:00 4 mg via INTRAVENOUS
  Filled 2020-03-09: qty 2

## 2020-03-09 MED ORDER — SODIUM CHLORIDE 0.9 % IV SOLN
500.0000 mg | INTRAVENOUS | Status: DC
  Administered 2020-03-09: 17:00:00 500 mg via INTRAVENOUS
  Filled 2020-03-09: qty 500

## 2020-03-09 MED ORDER — GLUCERNA SHAKE PO LIQD
237.0000 mL | Freq: Three times a day (TID) | ORAL | Status: DC
  Administered 2020-03-09 – 2020-03-14 (×10): 237 mL via ORAL
  Filled 2020-03-09 (×6): qty 237

## 2020-03-09 MED ORDER — LACTULOSE 10 GM/15ML PO SOLN
20.0000 g | Freq: Three times a day (TID) | ORAL | Status: DC
  Administered 2020-03-09 – 2020-03-11 (×5): 20 g via ORAL
  Filled 2020-03-09 (×5): qty 30

## 2020-03-09 MED ORDER — SODIUM CHLORIDE 0.9 % IV SOLN
3.0000 g | Freq: Three times a day (TID) | INTRAVENOUS | Status: DC
  Administered 2020-03-09 – 2020-03-10 (×2): 3 g via INTRAVENOUS
  Filled 2020-03-09 (×2): qty 8

## 2020-03-09 MED ORDER — SODIUM CHLORIDE 0.9 % IV BOLUS
500.0000 mL | Freq: Once | INTRAVENOUS | Status: AC
  Administered 2020-03-09: 17:00:00 500 mL via INTRAVENOUS

## 2020-03-09 NOTE — ED Notes (Signed)
Pt noted to stare off into the left for about 6 secs. As nurse was writing this note pt had a 20 sec turn to left eyes and left leg twitches.

## 2020-03-09 NOTE — ED Notes (Signed)
Pacemaker to left upper chest. Rash/ scratches all over upper abdomin, back, chest area. Pt has calamine lotion applied to areas. Pt states rash there x 2 weeks unknown origin.

## 2020-03-09 NOTE — ED Notes (Signed)
Dr, escorting to CT

## 2020-03-09 NOTE — ED Notes (Signed)
Waiting for medications from [facility]

## 2020-03-09 NOTE — H&P (Signed)
History and Physical  John [name] NBV:[id number] DOB: 1945/08/27 DOA: 03/09/2020  Referring physician: Margette Fast, MD PCP: Erven Colla, DO  Patient coming from: Home  Chief Complaint: Abdominal Pain  HPI: John [name] is a 74 y.o. male with medical history significant for [place] John [name] is a 74 y.o. male with PMH afib on Eliquis, CHF, DM on insulin, HTN, HLD, NASH who presents to the emergency department due to abdominal pain.  Most of the history was obtained from ED physician and wife at bedside.  Per wife, patient has been having increasing shortness of breath especially at night when he lies down since last 3 weeks, patient also have been having cough with occasional sputum production and chest congestion since last 2 weeks.  Patient complained of generally not feeling well, EMS was activated and on arrival of EMS, patient was noted to be hypoxic with O2 sat at 88% on room air, supplemental oxygen via [place] at 4 LPM was given with improvement in O2 sat sats (patient does not use oxygen at baseline).  There was no report of fever, chills, chest pain, headache or vomiting.  ED Course:  In the emergency department, BP was 94/56, other vital signs were within normal range.  Work-up in the ED showed thrombocytopenia, hyponatremia, BUN/creatinine 70/2.38 (baseline creatinine at 1.6-1.7), troponin x2 - 39, ammonia 64, BP 1541.  Chronically elevated, this was 1718 on 11/11/2018) T bili 4.9 and elevated liver enzymes. CT abdomen and pelvis without contrast showed multifocal pneumonia and cirrhosis with small perihepatic free fluid. RUQ US showed sludge and stones in the gallbladder with slight wall thickening and positive sonographic Percell Miller, concerning for acute cholecystitis Chest x-ray showed cardiomegaly with vascular congestion and suggestion of pulmonary edema.  More focal opacity at right lung base suspicious for pneumonia, asymmetric edema or atelectasis felt less likely. CT of  head without contrast showed no acute intracranial abnormalities IV Lasix 40 mg x 1 was given with subsequent drop in BP to 83/55, IV hydration was provided and patient was placed in Trendelenburg position.  He was empirically started on IV antibiotics due to presumed CAP. Hospitalist was asked to admit patient for further evaluation and management  Review of Systems: Constitutional: Negative for chills and fever.  HENT: Negative for ear pain and sore throat.   Eyes: Negative for pain and visual disturbance.  Respiratory: Positive for cough and shortness of breath.   Cardiovascular: Negative for chest pain and palpitations.  Gastrointestinal: Positive for abdominal pain (resolved) and nausea.  Negative for vomiting.  Endocrine: Negative for polyphagia and polyuria.  Genitourinary: Negative for decreased urine volume, dysuria, enuresis Musculoskeletal: Negative for arthralgias and back pain.  Skin: Negative for color change and rash.  Allergic/Immunologic: Negative for immunocompromised state.  Neurological: Negative for tremors, syncope, speech difficulty, weakness, light-headedness and headaches.  Hematological: Does not bruise/bleed easily.  All other systems reviewed and are negative  Past Medical History:  Diagnosis Date  . Atrial flutter ([place]) 12/2010   Admitted with symptomatic bradycardia (HR 40s), atrial flutter with slow ventricular response 12/2010 + volume overload; AV nodal agents d/c'd and Pradaxa started; RFA in 01/2011  . CHF (congestive heart failure) ([place])   . Chronic combined systolic and diastolic heart failure ([place])    a. echo 01/06/11: mild LVH, EF 65-70%, mod to severe LAE, mild RVE, mild RAE, PASP 32;   TEE 10/12: EF 45-50% b. EF 45 to 50% by echo in 07/2018  . Class 2 severe  obesity due to excess calories with serious comorbidity and body mass index (BMI) of 37.0 to 37.9 in adult [facility]) 07/17/2017  . Diabetes mellitus    non insulin dependant  . Hyperlipidemia   .  Hypertension   . Osteoarthritis   . Presence of permanent cardiac pacemaker   . PSVT (paroxysmal supraventricular tachycardia) (HCC)    Possibly atrial flutter   Past Surgical History:  Procedure Laterality Date  . ATRIAL FLUTTER ABLATION N/A 02/07/2011   Procedure: ATRIAL FLUTTER ABLATION;  Surgeon: Evans Lance, MD;  Location: [facility] CATH LAB;  Service: Cardiovascular;  Laterality: N/A;  . CARDIAC ELECTROPHYSIOLOGY STUDY AND ABLATION  02/07/11  . CARDIOVERSION N/A 03/23/2016   Procedure: CARDIOVERSION;  Surgeon: Evans Lance, MD;  Location: [place];  Service: Cardiovascular;  Laterality: N/A;  . COLONOSCOPY N/A 04/17/2017   Procedure: COLONOSCOPY;  Surgeon: Rogene Houston, MD;  Location[facility]O [facility];  Service: Endoscopy;  Laterality: N/A;  830  . EP IMPLANTABLE DEVICE N/A 08/29/2015   Procedure: Pacemaker Implant;  Surgeon: Evans Lance, MD;  Location: [place] CV LAB;  Service: Cardiovascular;  Laterality: N/A;  . EP IMPLANTABLE DEVICE N/A 12/07/2015   Procedure: PPM Lead Revision/Repair;  Surgeon: Evans Lance, MD;  Location: [place] CV LAB;  Service: Cardiovascular;  Laterality: N/A;  . KNEE ARTHROSCOPY  2005   left  . LUMBAR SPINE SURGERY     "I've had 6 ORs 1972 thru 2004"  . POLYPECTOMY  04/17/2017   Procedure: POLYPECTOMY;  Surgeon: Rogene Houston, MD;  [facility];  Service: Endoscopy;;  transverse colon x3;  . RIGHT/LEFT HEART CATH AND CORONARY ANGIOGRAPHY N/A 12/21/2019   Procedure: RIGHT/LEFT HEART CATH AND CORONARY ANGIOGRAPHY;  Surgeon: Nelva Bush, MD;  Location: [place] CV LAB;  Service: Cardiovascular;  Laterality: N/A;  . TEE WITHOUT CARDIOVERSION N/A 11/05/2019   Procedure: TRANSESOPHAGEAL ECHOCARDIOGRAM (TEE) WITH PROPOFOL;  Surgeon: Arnoldo Lenis, MD;  [facility];  Service: Endoscopy;  Laterality: N/A;    Social History:  reports that he quit smoking about 33 years ago. His smoking use included cigarettes. He  has a 10.00 pack-year smoking history. He has never used smokeless tobacco. He reports that he does not drink alcohol and does not use drugs.   No Known Allergies  Family History  Problem Relation Age of Onset  . Heart attack Mother   . Hypertension Mother   . Heart attack Father   . Hypertension Father   . Heart attack Brother   . Colon cancer Neg Hx     Prior to Admission medications   Medication Sig Start Date End Date Taking? Authorizing Provider  ACCU-CHEK AVIVA PLUS test strip TEST BLOOD SUGAR TWICE DAILY BEFORE BREAKFAST AND AT BEDTIME 10/05/19   Cassandria Anger, MD  acetaminophen (TYLENOL) 500 MG tablet Take by mouth.    [provider]  apixaban (ELIQUIS) 5 MG TABS tablet Take 1 tablet (5 mg total) by mouth 2 (two) times daily. 12/22/19   End, Harrell Gave, MD  benzonatate (TESSALON) 100 MG capsule Take 1 capsule (100 mg total) by mouth 3 (three) times daily as needed for cough. 01/05/20   Evans Lance, MD  carvedilol (COREG) 25 MG tablet Take 1 tablet (25 mg total) by mouth 2 (two) times daily with a meal. 12/01/19   Evans Lance, MD  HYDROcodone-acetaminophen (NORCO/VICODIN) 5-325 MG tablet Take one tablet po every 4 hrs prn pain Patient taking differently: Take  1 tablet by mouth every 4 (four) hours as needed for moderate pain. 10/10/18   Mikey Kirschner, MD  hydrocortisone 2.5 % cream Apply BID to affected area Patient taking differently: Apply 1 application topically 2 (two) times daily as needed (skin irritation.). 08/06/19   Mikey Kirschner, MD  insulin degludec (TRESIBA FLEXTOUCH) 200 UNIT/ML FlexTouch Pen Inject 50 Units into the skin at bedtime. Patient taking differently: Inject 60 Units into the skin at bedtime. 02/25/20   Brita Romp, NP  Insulin Pen Needle (BD PEN NEEDLE NANO U/F) 32G X 4 MM MISC 1 each by Does not apply route 4 (four) times daily. 12/23/18   Cassandria Anger, MD  Multiple Vitamin (MULTIVITAMIN WITH MINERALS) TABS tablet  Take 1 tablet by mouth daily. One A Day for Men    [provider]  potassium chloride SA (KLOR-CON) 20 MEQ tablet Take 60 meq in the am, at lunch, and in the evening Patient taking differently: Take 60 mEq by mouth daily. 10/05/19   Evans Lance, MD  pravastatin (PRAVACHOL) 20 MG tablet Take 1 tablet (20 mg total) by mouth every evening. 01/25/20 04/24/20  Arnoldo Lenis, MD  sildenafil (VIAGRA) 50 MG tablet Take 1 tablet (50 mg total) by mouth daily as needed for erectile dysfunction. 02/10/19   Evans Lance, MD  spironolactone (ALDACTONE) 25 MG tablet Take 0.5 tablets (12.5 mg total) by mouth daily. 01/05/20 04/04/20  Arnoldo Lenis, MD  torsemide (DEMADEX) 20 MG tablet TAKE 2 TABLETS (40 MG TOTAL) BY MOUTH 2 (TWO) TIMES DAILY. 01/25/20   Arnoldo Lenis, MD  TRULICITY 1.5 WP/8.0DX SOPN INJECT 1.5MG (1 PEN) SUBCUTANEOUSLY EVERY WEEK 12/29/19   Cassandria Anger, MD  Zinc 50 MG TABS Take 50 mg by mouth daily.    [provider]    Physical Exam: BP (!) 87/66   Pulse 60   Temp 98.4 F (36.9 C) (Oral)   Resp (!) 21   Ht 6' (1.829 m)   Wt 99.8 kg   SpO2 97%   BMI 29.84 kg/m   . General: 74 y.o. year-old male well developed well nourished in no acute distress.  Alert and oriented x3. Marland Kitchen HEENT: Dry mucous membrane.  NCAT, EOMI . Neck: Supple, trachea medial . Cardiovascular: Regular rate and rhythm with no rubs or gallops.  No thyromegaly or JVD noted.  2/4 pulses in all 4 extremities. Marland Kitchen Respiratory: Clear to auscultation with no wheezes or rales. Good inspiratory effort. . Abdomen: Soft mild tenderness to palpation (RUQ). Normal bowel sounds x4 quadrants. . Muskuloskeletal: No cyanosis, clubbing or edema noted bilaterally . Neuro: CN II-XII intact, strength, sensation, reflexes . Skin: Diffuse rash noted on body.  No ulcerative lesions noted . Psychiatry: Judgement and insight appear normal. Mood is appropriate for condition and setting          Labs  on Admission:  Basic Metabolic Panel: Recent Labs  Lab 03/09/20 1544  NA 129*  K 3.6  CL 91*  CO2 25  GLUCOSE 92  BUN 70*  CREATININE 2.38*  CALCIUM 8.9   Liver Function Tests: Recent Labs  Lab 03/09/20 1544  AST 57*  ALT 25  ALKPHOS 167*  BILITOT 4.9*  PROT 7.9  ALBUMIN 3.4*   Recent Labs  Lab 03/09/20 1544  LIPASE 37   Recent Labs  Lab 03/09/20 1845  AMMONIA 64*   CBC: Recent Labs  Lab 03/09/20 1544  WBC 9.5  NEUTROABS 8.1*  HGB 13.9  HCT 41.4  MCV 91.6  PLT 101*   Cardiac Enzymes: No results for input(s): CKTOTAL, CKMB, CKMBINDEX, TROPONINI in the last 168 hours.  BNP (last 3 results) Recent Labs    03/09/20 1544  BNP 1,540.0*    ProBNP (last 3 results) No results for input(s): PROBNP in the last 8760 hours.  CBG: Recent Labs  Lab 03/09/20 1823  GLUCAP 110*    Radiological Exams on Admission: CT ABDOMEN PELVIS WO CONTRAST  Result Date: 03/09/2020 CLINICAL DATA:  74 year old male with epigastric pain. EXAM: CT ABDOMEN AND PELVIS WITHOUT CONTRAST TECHNIQUE: Multidetector CT imaging of the abdomen and pelvis was performed following the standard protocol without IV contrast. COMPARISON:  CT abdomen pelvis dated 10/29/2004. abdominal ultrasound dated 03/09/2020. FINDINGS: Evaluation of this exam is limited in the absence of intravenous contrast. Lower chest: Extensive confluent pulmonary opacities primarily involving the right lung as well as lingula most consistent with multifocal pneumonia. Clinical correlation and follow-up to resolution recommended. There is mild cardiomegaly. Cardiac pacemaker wires noted. No intra-abdominal free air. Small perihepatic free fluid. Hepatobiliary: Cirrhosis. No intrahepatic biliary dilatation. Layering gallstones. No pericholecystic fluid or evidence of acute cholecystitis by CT. Pancreas: Mild peripancreatic haziness. Correlation with pancreatic enzymes recommended to evaluate for possibility of acute  pancreatitis. No fluid collection. Spleen: Normal in size without focal abnormality. Adrenals/Urinary Tract: The adrenal glands unremarkable. There is no hydronephrosis or nephrolithiasis on either side. The visualized ureters appear unremarkable. The urinary bladder is collapsed. Stomach/Bowel: There is diffuse colonic diverticulosis without active inflammatory changes. There is no bowel obstruction or active inflammation. The appendix is normal. There is a 5.1 x 3.6 cm fluid attenuating structure in the mesentery along the inferior aspect of the third portion of the duodenum. There are areas of calcifications along the wall of this structure. This was present on the CT of 2006 but slightly larger on today's exam. 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 Vascular/Lymphatic: There is advanced aortoiliac atherosclerotic disease. The IVC is unremarkable. No portal venous gas. There is no adenopathy. Reproductive: The prostate and seminal vesicles are grossly unremarkable. No pelvic masses. Prominent pelvic vasculature. Other: Bilateral fat containing inguinal hernias. Musculoskeletal: Osteopenia with degenerative changes of the spine. No acute osseous pathology. IMPRESSION: 1. Multifocal pneumonia. Clinical correlation and follow-up to resolution recommended. 2. Cirrhosis with small perihepatic free fluid. 3. Cholelithiasis. 4. Colonic diverticulosis. No bowel obstruction. Normal appendix. 5. Aortic Atherosclerosis (ICD10-I70.0). Electronically Signed   By: Anner Crete M.D.   On: 03/09/2020 18:49   CT Head Wo Contrast  Result Date: 03/09/2020 CLINICAL DATA:  Mental status changes. EXAM: CT HEAD WITHOUT CONTRAST TECHNIQUE: Contiguous axial images were obtained from the base of the skull through the vertex without intravenous contrast. COMPARISON:  None FINDINGS: Brain: No evidence of acute infarction, hemorrhage, hydrocephalus, extra-axial collection or mass  lesion/mass effect. There is mild diffuse low-attenuation within the subcortical and periventricular white matter compatible with chronic microvascular disease. Prominence of the sulci and ventricles compatible with brain atrophy. Vascular: No hyperdense vessel or unexpected calcification. Skull: Normal. Negative for fracture or focal lesion. Sinuses/Orbits: No acute finding. Other: None. IMPRESSION: 1. No acute intracranial abnormalities. 2. Chronic small vessel ischemic change and brain atrophy. Electronically Signed   By: Kerby Moors M.D.   On: 03/09/2020 18:47   DG Chest Portable 1 View  Result Date: 03/09/2020 CLINICAL DATA:  Shortness of breath. Nausea and abdominal pain. Hypoxia. EXAM: PORTABLE CHEST 1 VIEW COMPARISON:  Most recent radiograph 11/11/2018 FINDINGS: Dual lead left-sided pacemaker in place. Chronic cardiomegaly. Unchanged mediastinal contours. Vascular congestion and suggestion of pulmonary edema. There is also patchy opacity at the right lung base. No pneumothorax. No large pleural effusion. No acute osseous abnormalities are seen. IMPRESSION: 1. Cardiomegaly with vascular congestion and suggestion of pulmonary edema. 2. More focal opacity at the right lung base is suspicious for pneumonia, asymmetric edema or atelectasis felt less likely. Electronically Signed   By: Keith Rake M.D.   On: 03/09/2020 16:09   US Abdomen Limited RUQ (LIVER/GB)  Result Date: 03/09/2020 CLINICAL DATA:  Right upper quadrant pain EXAM: ULTRASOUND ABDOMEN LIMITED RIGHT UPPER QUADRANT COMPARISON:  11/24/2019 FINDINGS: Gallbladder: Sludge and stones within the gallbladder. Slight increased gallbladder wall thickness of 4.3 mm. Positive sonographic Murphy. Common bile duct: Diameter: 3.7 mm Liver: Coarse liver with suspected contour nodularity/cirrhosis. No focal hepatic abnormality. Portal vein is patent on color Doppler imaging with normal direction of blood flow towards the liver. Other: None.  IMPRESSION: 1. Sludge and stones in the gallbladder with slight wall thickening and positive sonographic Murphy, concerning for acute cholecystitis. Negative for biliary dilatation 2. Suspected liver cirrhosis. Electronically Signed   By: Donavan Foil M.D.   On: 03/09/2020 17:56    EKG: I independently viewed the EKG done and my findings are as followed: Junctional rhythm at a rate of 84 bpm, RBBB.  QTc 587 ms  Assessment/Plan Present on Admission: . Acute respiratory failure with hypoxemia ([place]) . Atrial flutter ([place]) . Mixed hyperlipidemia . DM type 2 causing vascular disease ([place]) . Essential hypertension, benign . Elevated troponin . Chronic HFrEF (heart failure with reduced ejection fraction) ([place]) . Hyponatremia . Hyperbilirubinemia . NASH (nonalcoholic steatohepatitis)  Principal Problem:   Acute respiratory failure with hypoxemia (HCC) Active Problems:   DM type 2 causing vascular disease ([place])   Mixed hyperlipidemia   Essential hypertension, benign   Atrial flutter (HCC)   Elevated troponin   Chronic HFrEF (heart failure with reduced ejection fraction) (HCC)   Hyponatremia   Hyperbilirubinemia   NASH (nonalcoholic steatohepatitis)   Acute kidney injury superimposed on CKD (HCC)   Hyperammonemia (HCC)   Elevated liver enzymes   Thrombocytopenia (HCC)   Hypoalbuminemia   Dehydration   Transaminitis   Multifocal pneumonia   Acute cholecystitis   Prolonged QT interval   Diffuse papular rash  Acute respiratory failure with hypoxia possibly secondary to presumed CAP POA CT abdomen and pelvis without contrast showed multifocal pneumonia PORT/PSI of 174 points  indicating 27-29.2% mortality She was started on IV ceftriaxone and Zosyn, we shall continue with Unasyn and doxycycline Continue Mucinex, incentive spirometry, flutter valve Blood culture, urine Legionella, strep pneumo and sputum culture pending  Nausea and abdominal pain in the setting of questionable  acute cholecystitis RUQ US showed sludge and stones in the gallbladder with slight wall thickening and positive sonographic Murphy, concerning for acute cholecystitis Continue IV Compazine 10 mg every 6 hours as needed General surgery was consulted and will see patient in the morning per ED physician BP dropped after IV morphine (and IV Lasix) were given in the ED.  Abdominal pain currently relieved.  Acute kidney injury superimposed on CKD stage IV/dehydration BUN/creatinine 70/2.38 (baseline creatinine at 1.6-1.7) IV hydration was provided Renally adjust medications, avoid nephrotoxic agents/dehydration/hypotension  Hyponatremia possibly due to dehydration/diuretics Na 129, IV hydration was provided  Elevated troponin possibly due to type II demand ischemia Troponin x2-39  Transaminitis and hyperbilirubinemia in the setting  of NASH/Cirrhosis CT abdomen and pelvis without contrast was suggestive of cirrhosis Patient has history of NASH and follow with Dr. Jenetta Downer last visit being  12/9  Hyperammonemia secondary to NASH/cirrhosis  Ammonia 64; lactulose will be provided  Prolonged QTc QTc 587 Avoid QT prolonging drugs Magnesium level will be checked  Diffuse rash Continue calamine lotion  Chronic HFrEF Coreg, torsemide and Aldactone will be held at this time due to soft BP Pravastatin will be held at this time due to elevated liver enzymes  Atrial flutter Coreg will be held at this time due to soft BP Continue Eliquis  Mixed hyperlipidemia Pravastatin will be held at this time due to elevated liver enzymes  Type II DM Continue insulin sliding scale and hypoglycemia protocol  Thrombocytopenia (Chronic)-stable  Hypoalbuminemia possibly due to mild protein calorie malnutrition Protein supplement will be provided   DVT prophylaxis: Eliquis  Code Status: Full code  Family Communication: Wife at bedside (all questions answered to satisfaction)  Disposition Plan:   Patient is from:                        home Anticipated DC to:                   SNF or family members home Anticipated DC date:               2-3 days Anticipated DC barriers:         Patient is unstable to be discharged at this time due to presumed CAP POA and questionable acute cholecystitis pending surgical evaluation and recommendation  Consults called: General surgery  Admission status: Inpatient    Bernadette Hoit MD [facility]  03/09/2020, 9:39 PM

## 2020-03-09 NOTE — ED Triage Notes (Signed)
Pt presents to ED via [facility] c/o just didn't feel good. Once EMS arrived pt c/o nausea and abdominal pain per EMS. No vomiting or diarrhea noted.  Pt is on RA at home but stats were 88% on truck. 4 lpm applied oxygen 94%. Pt verbalized he was vaccinated with both covid vaccines and has a hx of CHF and COPD.

## 2020-03-09 NOTE — ED Notes (Signed)
Another 30 sec stare into space and stiffness noted.

## 2020-03-09 NOTE — ED Provider Notes (Signed)
Emergency Department Provider Note   I have reviewed the triage vital signs and the nursing notes.   HISTORY  Chief Complaint Abdominal Pain   HPI John [name] is a 74 y.o. male with PMH reviewed below including CHF (EF 35-40%), DM, HTN, HLD, and A flutter on anticoagulation presents to the emergency department by EMS for evaluation of generally not feeling well.  He is complaining of some shortness of breath and on scene with EMS was complaining of abdominal pain with nausea.  He denied ever having chest pain.  Symptoms began today shortly before he called EMS.  Yesterday states he was feeling fine has been compliant with his medications.  He denies any pain right now but tells me that "I just feel uncomfortable." Denies any current abdominal pain or CP. Notes some SOB symptoms.  He does not wear oxygen at home but arrives by EMS with 4 L nasal cannula in place with 88% RA O2 recorded by EMS. No radiation of symptoms or modifying factors.   Past Medical History:  Diagnosis Date  . Atrial flutter ([place]) 12/2010   Admitted with symptomatic bradycardia (HR 40s), atrial flutter with slow ventricular response 12/2010 + volume overload; AV nodal agents d/c'd and Pradaxa started; RFA in 01/2011  . CHF (congestive heart failure) ([place])   . Chronic combined systolic and diastolic heart failure ([place])    a. echo 01/06/11: mild LVH, EF 65-70%, mod to severe LAE, mild RVE, mild RAE, PASP 32;   TEE 10/12: EF 45-50% b. EF 45 to 50% by echo in 07/2018  . Class 2 severe obesity due to excess calories with serious comorbidity and body mass index (BMI) of 37.0 to 37.9 in adult ([place]) 07/17/2017  . Diabetes mellitus    non insulin dependant  . Hyperlipidemia   . Hypertension   . Osteoarthritis   . Presence of permanent cardiac pacemaker   . PSVT (paroxysmal supraventricular tachycardia) (HCC)    Possibly atrial flutter    Patient Active Problem List   Diagnosis Date Noted  . Acute respiratory  failure with hypoxemia ([place]) 03/09/2020  . Acute kidney injury superimposed on CKD ([place]) 03/09/2020  . Hyperammonemia ([place]) 03/09/2020  . Elevated liver enzymes 03/09/2020  . Thrombocytopenia ([place]) 03/09/2020  . Hypoalbuminemia 03/09/2020  . Dehydration 03/09/2020  . Transaminitis 03/09/2020  . Multifocal pneumonia 03/09/2020  . Acute cholecystitis 03/09/2020  . Prolonged QT interval 03/09/2020  . Diffuse papular rash 03/09/2020  . NASH (nonalcoholic steatohepatitis) 03/03/2020  . Elevated LFTs 03/03/2020  . Constipation 03/03/2020  . Lower leg edema 10/14/2019  . Class 2 obesity   . Acute on chronic combined systolic and diastolic HF (heart failure) ([place]) 11/12/2018  . Hyponatremia 11/11/2018  . Hyperbilirubinemia 11/11/2018  . CHF (congestive heart failure) ([place]) 08/19/2018  . Stage 3 chronic kidney disease ([place])   . Paroxysmal atrial fibrillation (HCC)   . Chronic HFrEF (heart failure with reduced ejection fraction) ([place]) 03/02/2018  . Pacemaker 03/02/2018  . History of colonic polyps 12/28/2016  . Atrial pacemaker lead displacement 12/07/2015  . Acute CHF (congestive heart failure) ([place]) 11/21/2015  . CHF, acute on chronic ([place]) 11/20/2015  . Complete heart block ([place]) 11/20/2015  . Chronic diastolic CHF (congestive heart failure) ([place]) 08/28/2015  . Hypertensive heart disease 08/28/2015  . NSVT (nonsustained ventricular tachycardia) ([place]) 08/28/2015  . Elevated troponin 08/28/2015  . Second degree heart block 08/25/2015  . Bradycardia 08/25/2015  . Acute diastolic CHF (congestive heart failure) ([place]) 08/25/2015  .  Current use of Ailin [place] term anticoagulation 05/04/2011  . Atrial flutter ([place]) 01/05/2011  . DM type 2 causing vascular disease ([place]) 12/23/2009  . Mixed hyperlipidemia 12/23/2009  . Essential hypertension, benign 12/23/2009  . DEGENERATIVE JOINT DISEASE 12/23/2009    Past Surgical History:  Procedure Laterality Date  . ATRIAL FLUTTER ABLATION N/A 02/07/2011    Procedure: ATRIAL FLUTTER ABLATION;  Surgeon: Evans Lance, MD;  Location: [facility] CATH LAB;  Service: Cardiovascular;  Laterality: N/A;  . CARDIAC ELECTROPHYSIOLOGY STUDY AND ABLATION  02/07/11  . CARDIOVERSION N/A 03/23/2016   Procedure: CARDIOVERSION;  Surgeon: Evans Lance, MD;  Location: [place];  Service: Cardiovascular;  Laterality: N/A;  . COLONOSCOPY N/A 04/17/2017   Procedure: COLONOSCOPY;  Surgeon: Rogene Houston, MD;  [facility];  Service: Endoscopy;  Laterality: N/A;  830  . EP IMPLANTABLE DEVICE N/A 08/29/2015   Procedure: Pacemaker Implant;  Surgeon: Evans Lance, MD;  Location: [place] CV LAB;  Service: Cardiovascular;  Laterality: N/A;  . EP IMPLANTABLE DEVICE N/A 12/07/2015   Procedure: PPM Lead Revision/Repair;  Surgeon: Evans Lance, MD;  Location: [place] CV LAB;  Service: Cardiovascular;  Laterality: N/A;  . KNEE ARTHROSCOPY  2005   left  . LUMBAR SPINE SURGERY     "I've had 6 ORs 1972 thru 2004"  . POLYPECTOMY  04/17/2017   Procedure: POLYPECTOMY;  Surgeon: Rogene Houston, MD;  [facility];  Service: Endoscopy;;  transverse colon x3;  . RIGHT/LEFT HEART CATH AND CORONARY ANGIOGRAPHY N/A 12/21/2019   Procedure: RIGHT/LEFT HEART CATH AND CORONARY ANGIOGRAPHY;  Surgeon: Nelva Bush, MD;  Location: [place] CV LAB;  Service: Cardiovascular;  Laterality: N/A;  . TEE WITHOUT CARDIOVERSION N/A 11/05/2019   Procedure: TRANSESOPHAGEAL ECHOCARDIOGRAM (TEE) WITH PROPOFOL;  Surgeon: Arnoldo Lenis, MD;  [facility];  Service: Endoscopy;  Laterality: N/A;    Allergies Patient has no known allergies.  Family History  Problem Relation Age of Onset  . Heart attack Mother   . Hypertension Mother   . Heart attack Father   . Hypertension Father   . Heart attack Brother   . Colon cancer Neg Hx     Social History Social History   Tobacco Use  . Smoking status: Former Smoker    Packs/day: 1.00    Years: 10.00     Pack years: 10.00    Types: Cigarettes    Quit date: 03/26/1986    Years since quitting: 33.9  . Smokeless tobacco: Never Used  . Tobacco comment: "stopped cigarette  smoking 1988"  Vaping Use  . Vaping Use: Never used  Substance Use Topics  . Alcohol use: Never    Alcohol/week: 0.0 standard drinks    Comment: "quit alcohol ~ 2007"  . Drug use: No    Review of Systems  Constitutional: No fever/chills. General fatigue.  Eyes: No visual changes. ENT: No sore throat. Cardiovascular: Denies chest pain. Respiratory: Positive shortness of breath. Gastrointestinal: Positive abdominal pain (resolved). Positive nausea, no vomiting.  No diarrhea.  No constipation. Genitourinary: Negative for dysuria. Musculoskeletal: Negative for back pain. Skin: Negative for rash. Neurological: Negative for headaches, focal weakness or numbness.  10-point ROS otherwise negative.  ____________________________________________   PHYSICAL EXAM:  VITAL SIGNS: Vitals:   03/09/20 2215 03/09/20 2230  BP: 93/68 96/69  Pulse: (!) 59 61  Resp: 14 17  Temp:    SpO2: 98% 100%    Constitutional: Alert and oriented. Patient denies pain  but is moaning and groaning.  Eyes: Conjunctivae are normal. PERRL.  Head: Atraumatic. Nose: No congestion/rhinnorhea. Mouth/Throat: Mucous membranes are moist.  Neck: No stridor Cardiovascular: Normal rate, regular rhythm. Good peripheral circulation. Grossly normal heart sounds.   Respiratory: Normal respiratory effort.  No retractions. Lungs CTAB. Gastrointestinal: Soft with mild diffuse abdominal tenderness. No distention.  Musculoskeletal: No lower extremity tenderness nor edema. No gross deformities of extremities. Neurologic:  Normal speech and language. No gross focal neurologic deficits are appreciated.  Skin:  Skin is warm, dry and intact. No rash noted.   ____________________________________________   LABS (all labs ordered are listed, but only  abnormal results are displayed)  Labs Reviewed  COMPREHENSIVE METABOLIC PANEL - Abnormal; Notable for the following components:      Result Value   Sodium 129 (*)    Chloride 91 (*)    BUN 70 (*)    Creatinine, Ser 2.38 (*)    Albumin 3.4 (*)    AST 57 (*)    Alkaline Phosphatase 167 (*)    Total Bilirubin 4.9 (*)    GFR, Estimated 28 (*)    All other components within normal limits  BRAIN NATRIURETIC PEPTIDE - Abnormal; Notable for the following components:   B Natriuretic Peptide 1,540.0 (*)    All other components within normal limits  CBC WITH DIFFERENTIAL/PLATELET - Abnormal; Notable for the following components:   RDW 17.3 (*)    Platelets 101 (*)    Neutro Abs 8.1 (*)    All other components within normal limits  AMMONIA - Abnormal; Notable for the following components:   Ammonia 64 (*)    All other components within normal limits  CBG MONITORING, ED - Abnormal; Notable for the following components:   Glucose-Capillary 110 (*)    All other components within normal limits  TROPONIN I (HIGH SENSITIVITY) - Abnormal; Notable for the following components:   Troponin I (High Sensitivity) 39 (*)    All other components within normal limits  TROPONIN I (HIGH SENSITIVITY) - Abnormal; Notable for the following components:   Troponin I (High Sensitivity) 39 (*)    All other components within normal limits  RESP PANEL BY RT-PCR (FLU A&B, COVID) ARPGX2  CULTURE, BLOOD (ROUTINE X 2)  CULTURE, BLOOD (ROUTINE X 2)  LIPASE, BLOOD  COMPREHENSIVE METABOLIC PANEL  CBC  PROTIME-INR  APTT  MAGNESIUM  PHOSPHORUS  LEGIONELLA PNEUMOPHILA SEROGP 1 UR AG  STREP PNEUMONIAE URINARY ANTIGEN  HEMOGLOBIN A1C  CBG MONITORING, ED   ____________________________________________  EKG   EKG Interpretation  Date/Time:  Wednesday March 09 2020 15:40:47 EST Ventricular Rate:  73 PR Interval:    QRS Duration: 189 QT Interval:  468 QTC Calculation: 516 R Axis:   -95 Text  Interpretation: Atrial fibrillation Ventricular premature complex Right bundle branch block No significant change since last tracing No STEMI Confirmed by Nanda Quinton [phone]) on 03/09/2020 3:42:59 PM       ____________________________________________  RADIOLOGY  CT ABDOMEN PELVIS WO CONTRAST  Result Date: 03/09/2020 CLINICAL DATA:  74 year old male with epigastric pain. EXAM: CT ABDOMEN AND PELVIS WITHOUT CONTRAST TECHNIQUE: Multidetector CT imaging of the abdomen and pelvis was performed following the standard protocol without IV contrast. COMPARISON:  CT abdomen pelvis dated 10/29/2004. abdominal ultrasound dated 03/09/2020. FINDINGS: Evaluation of this exam is limited in the absence of intravenous contrast. Lower chest: Extensive confluent pulmonary opacities primarily involving the right lung as well as lingula most consistent with multifocal pneumonia. Clinical correlation and follow-up  to resolution recommended. There is mild cardiomegaly. Cardiac pacemaker wires noted. No intra-abdominal free air. Small perihepatic free fluid. Hepatobiliary: Cirrhosis. No intrahepatic biliary dilatation. Layering gallstones. No pericholecystic fluid or evidence of acute cholecystitis by CT. Pancreas: Mild peripancreatic haziness. Correlation with pancreatic enzymes recommended to evaluate for possibility of acute pancreatitis. No fluid collection. Spleen: Normal in size without focal abnormality. Adrenals/Urinary Tract: The adrenal glands unremarkable. There is no hydronephrosis or nephrolithiasis on either side. The visualized ureters appear unremarkable. The urinary bladder is collapsed. Stomach/Bowel: There is diffuse colonic diverticulosis without active inflammatory changes. There is no bowel obstruction or active inflammation. The appendix is normal. There is a 5.1 x 3.6 cm fluid attenuating structure in the mesentery along the inferior aspect of the third portion of the duodenum. There are areas of  calcifications along the wall of this structure. This was present on the CT of 2006 but slightly larger on today's exam. 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 Vascular/Lymphatic: There is advanced aortoiliac atherosclerotic disease. The IVC is unremarkable. No portal venous gas. There is no adenopathy. Reproductive: The prostate and seminal vesicles are grossly unremarkable. No pelvic masses. Prominent pelvic vasculature. Other: Bilateral fat containing inguinal hernias. Musculoskeletal: Osteopenia with degenerative changes of the spine. No acute osseous pathology. IMPRESSION: 1. Multifocal pneumonia. Clinical correlation and follow-up to resolution recommended. 2. Cirrhosis with small perihepatic free fluid. 3. Cholelithiasis. 4. Colonic diverticulosis. No bowel obstruction. Normal appendix. 5. Aortic Atherosclerosis (ICD10-I70.0). Electronically Signed   By: Anner Crete M.D.   On: 03/09/2020 18:49   CT Head Wo Contrast  Result Date: 03/09/2020 CLINICAL DATA:  Mental status changes. EXAM: CT HEAD WITHOUT CONTRAST TECHNIQUE: Contiguous axial images were obtained from the base of the skull through the vertex without intravenous contrast. COMPARISON:  None FINDINGS: Brain: No evidence of acute infarction, hemorrhage, hydrocephalus, extra-axial collection or mass lesion/mass effect. There is mild diffuse low-attenuation within the subcortical and periventricular white matter compatible with chronic microvascular disease. Prominence of the sulci and ventricles compatible with brain atrophy. Vascular: No hyperdense vessel or unexpected calcification. Skull: Normal. Negative for fracture or focal lesion. Sinuses/Orbits: No acute finding. Other: None. IMPRESSION: 1. No acute intracranial abnormalities. 2. Chronic small vessel ischemic change and brain atrophy. Electronically Signed   By: Kerby Moors M.D.   On: 03/09/2020 18:47   DG Chest Portable 1  View  Result Date: 03/09/2020 CLINICAL DATA:  Shortness of breath. Nausea and abdominal pain. Hypoxia. EXAM: PORTABLE CHEST 1 VIEW COMPARISON:  Most recent radiograph 11/11/2018 FINDINGS: Dual lead left-sided pacemaker in place. Chronic cardiomegaly. Unchanged mediastinal contours. Vascular congestion and suggestion of pulmonary edema. There is also patchy opacity at the right lung base. No pneumothorax. No large pleural effusion. No acute osseous abnormalities are seen. IMPRESSION: 1. Cardiomegaly with vascular congestion and suggestion of pulmonary edema. 2. More focal opacity at the right lung base is suspicious for pneumonia, asymmetric edema or atelectasis felt less likely. Electronically Signed   By: Keith Rake M.D.   On: 03/09/2020 16:09   US Abdomen Limited RUQ (LIVER/GB)  Result Date: 03/09/2020 CLINICAL DATA:  Right upper quadrant pain EXAM: ULTRASOUND ABDOMEN LIMITED RIGHT UPPER QUADRANT COMPARISON:  11/24/2019 FINDINGS: Gallbladder: Sludge and stones within the gallbladder. Slight increased gallbladder wall thickness of 4.3 mm. Positive sonographic Murphy. Common bile duct: Diameter: 3.7 mm Liver: Coarse liver with suspected contour nodularity/cirrhosis. No focal hepatic abnormality. Portal vein is patent on color Doppler imaging with normal direction of blood  flow towards the liver. Other: None. IMPRESSION: 1. Sludge and stones in the gallbladder with slight wall thickening and positive sonographic Murphy, concerning for acute cholecystitis. Negative for biliary dilatation 2. Suspected liver cirrhosis. Electronically Signed   By: Donavan Foil M.D.   On: 03/09/2020 17:56    ____________________________________________   PROCEDURES  Procedure(s) performed:   .Critical Care Performed by: Margette Fast, MD Authorized by: Margette Fast, MD   Critical care provider statement:    Critical care time (minutes):  75   Critical care time was exclusive of:  Separately billable  procedures and treating other patients and teaching time   Critical care was necessary to treat or prevent imminent or life-threatening deterioration of the following conditions:  Shock, respiratory failure and CNS failure or compromise   Critical care was time spent personally by me on the following activities:  Discussions with consultants, evaluation of patient's response to treatment, examination of patient, ordering and performing treatments and interventions, ordering and review of laboratory studies, ordering and review of radiographic studies, pulse oximetry, re-evaluation of patient's condition, obtaining history from patient or surrogate, review of old charts, blood draw for specimens and development of treatment plan with patient or surrogate   I assumed direction of critical care for this patient from another provider in my specialty: no       ____________________________________________   INITIAL IMPRESSION / ASSESSMENT AND PLAN / ED COURSE  Pertinent labs & imaging results that were available during my care of the patient were reviewed by me and considered in my medical decision making (see chart for details).   Patient arrives to the emergency department with feeling unwell generally.  In further discussion he seems to have some trouble breathing is his main concern.  He did have abdominal pain and nausea on scene by report with EMS but states his symptoms have resolved mostly.  05:11 PM  Patient reevaluated and is now complaining of some pain in the epigastric to right upper quadrant region of his abdomen.  CT scan without contrast ordered as patient appears to have some acute kidney injury.  BNP is elevated to 1500 with similar values in the past.  He does not appear acutely volume overloaded but I have added some Lasix initially but then with patient's blood pressure being slightly low gave 500 mL bolus of normal saline.  Chest x-ray showing vascular congestion but question of  some developing infiltrate as well.  Have started antibiotics and will follow CT.   05:50 PM  Called back to bedside. Patient with a mental status change. Patient intermittently less responsive without seizure activity. He awakens to gentle nudge. He is A&O x 3.   CT imaging of the head is normal.  CT abdomen pelvis shows no acute cholecystitis changes but does show multifocal pneumonia in the lower lung fields.  Patient is on supplemental O2 now and has been fluid responsive.  His MAPs have all been > 65. Will hold on pressors.  Ultrasound shows possible acute cholecystitis.  I discussed the case with Dr. Constance Haw on-call for general surgery who will consult on the case tomorrow but has no plans for operative intervention at this time.   Discussed patient's case with [facility] to request admission. Patient and family (if present) updated with plan. Care transferred to [facility] service.  I reviewed all nursing notes, vitals, pertinent old records, EKGs, labs, imaging (as available).  10:15 PM  Patient's blood pressures consistently been in the 89Q systolic.  I have gone back to reevaluate the patient multiple times his mental status is improved with supplemental O2 and increased blood pressures.  He denies any active pain is no longer moaning and groaning.   ____________________________________________  FINAL CLINICAL IMPRESSION(S) / ED DIAGNOSES  Final diagnoses:  RUQ pain  Acute respiratory failure with hypoxia (HCC)  Multifocal pneumonia  Hypotension, unspecified hypotension type  Transient alteration of awareness     MEDICATIONS GIVEN DURING THIS VISIT:  Medications  azithromycin (ZITHROMAX) 500 mg in sodium chloride 0.9 % 250 mL IVPB (0 mg Intravenous Stopped 03/09/20 1839)  apixaban (ELIQUIS) tablet 5 mg (5 mg Oral Given 03/09/20 2129)  Ampicillin-Sulbactam (UNASYN) 3 g in sodium chloride 0.9 % 100 mL IVPB (0 g Intravenous Stopped 03/09/20 2201)  doxycycline (VIBRAMYCIN) 100 mg in sodium  chloride 0.9 % 250 mL IVPB (has no administration in time range)  prochlorperazine (COMPAZINE) injection 10 mg (has no administration in time range)  lactulose (CHRONULAC) 10 GM/15ML solution 20 g (20 g Oral Given 03/09/20 2129)  insulin aspart (novoLOG) injection 0-9 Units (has no administration in time range)  insulin aspart (novoLOG) injection 0-5 Units (0 Units Subcutaneous Not Given 03/09/20 2147)  feeding supplement (GLUCERNA SHAKE) (GLUCERNA SHAKE) liquid 237 mL (237 mLs Oral Given 03/09/20 2215)  calamine lotion ( Topical Given 03/09/20 2215)  cefTRIAXone (ROCEPHIN) 1 g in sodium chloride 0.9 % 100 mL IVPB (0 g Intravenous Stopped 03/09/20 1749)  furosemide (LASIX) injection 40 mg (40 mg Intravenous Given 03/09/20 1715)  morphine 4 MG/ML injection 2 mg (2 mg Intravenous Given 03/09/20 1712)  ondansetron (ZOFRAN) injection 4 mg (4 mg Intravenous Given 03/09/20 1712)  sodium chloride 0.9 % bolus 500 mL (0 mLs Intravenous Stopped 03/09/20 1750)  sodium chloride 0.9 % bolus 1,000 mL (0 mLs Intravenous Stopped 03/09/20 1916)  piperacillin-tazobactam (ZOSYN) IVPB 3.375 g (0 g Intravenous Stopped 03/09/20 2052)  sodium chloride 0.9 % bolus 1,000 mL (0 mLs Intravenous Stopped 03/09/20 2058)    Note:  This document was prepared using Dragon voice recognition software and may include unintentional dictation errors.  Nanda Quinton, MD, [facility]    Jorryn Casagrande, [facility], MD 03/09/20 2236

## 2020-03-10 ENCOUNTER — Ambulatory Visit: Payer: Medicare HMO | Admitting: Family Medicine

## 2020-03-10 DIAGNOSIS — N179 Acute kidney failure, unspecified: Secondary | ICD-10-CM

## 2020-03-10 DIAGNOSIS — J181 Lobar pneumonia, unspecified organism: Secondary | ICD-10-CM

## 2020-03-10 DIAGNOSIS — K7581 Nonalcoholic steatohepatitis (NASH): Secondary | ICD-10-CM

## 2020-03-10 DIAGNOSIS — N1832 Chronic kidney disease, stage 3b: Secondary | ICD-10-CM

## 2020-03-10 DIAGNOSIS — Z7189 Other specified counseling: Secondary | ICD-10-CM

## 2020-03-10 DIAGNOSIS — I4821 Permanent atrial fibrillation: Secondary | ICD-10-CM

## 2020-03-10 DIAGNOSIS — I5043 Acute on chronic combined systolic (congestive) and diastolic (congestive) heart failure: Secondary | ICD-10-CM

## 2020-03-10 DIAGNOSIS — K8020 Calculus of gallbladder without cholecystitis without obstruction: Secondary | ICD-10-CM | POA: Insufficient documentation

## 2020-03-10 LAB — COMPREHENSIVE METABOLIC PANEL
ALT: 25 U/L (ref 0–44)
AST: 47 U/L — ABNORMAL HIGH (ref 15–41)
Albumin: 2.8 g/dL — ABNORMAL LOW (ref 3.5–5.0)
Alkaline Phosphatase: 122 U/L (ref 38–126)
Anion gap: 12 (ref 5–15)
BUN: 73 mg/dL — ABNORMAL HIGH (ref 8–23)
CO2: 24 mmol/L (ref 22–32)
Calcium: 8.4 mg/dL — ABNORMAL LOW (ref 8.9–10.3)
Chloride: 95 mmol/L — ABNORMAL LOW (ref 98–111)
Creatinine, Ser: 2.43 mg/dL — ABNORMAL HIGH (ref 0.61–1.24)
GFR, Estimated: 27 mL/min — ABNORMAL LOW (ref >60)
Glucose, Bld: 148 mg/dL — ABNORMAL HIGH (ref 70–99)
Potassium: 3.8 mmol/L (ref 3.5–5.1)
Sodium: 131 mmol/L — ABNORMAL LOW (ref 135–145)
Total Bilirubin: 4.7 mg/dL — ABNORMAL HIGH (ref 0.3–1.2)
Total Protein: 7 g/dL (ref 6.5–8.1)

## 2020-03-10 LAB — CBC
HCT: 38.6 % — ABNORMAL LOW (ref 39.0–52.0)
Hemoglobin: 13.1 g/dL (ref 13.0–17.0)
MCH: 31.6 pg (ref 26.0–34.0)
MCHC: 33.9 g/dL (ref 30.0–36.0)
MCV: 93.2 fL (ref 80.0–100.0)
Platelets: 80 10*3/uL — ABNORMAL LOW (ref 150–400)
RBC: 4.14 MIL/uL — ABNORMAL LOW (ref 4.22–5.81)
RDW: 17.9 % — ABNORMAL HIGH (ref 11.5–15.5)
WBC: 14.6 10*3/uL — ABNORMAL HIGH (ref 4.0–10.5)
nRBC: 0 % (ref 0.0–0.2)

## 2020-03-10 LAB — GLUCOSE, CAPILLARY
Glucose-Capillary: 205 mg/dL — ABNORMAL HIGH (ref 70–99)
Glucose-Capillary: 195 mg/dL — ABNORMAL HIGH (ref 70–99)

## 2020-03-10 LAB — HEMOGLOBIN A1C
Hgb A1c MFr Bld: 6.8 % — ABNORMAL HIGH (ref 4.8–5.6)
Mean Plasma Glucose: 148.46 mg/dL

## 2020-03-10 LAB — CBG MONITORING, ED
Glucose-Capillary: 136 mg/dL — ABNORMAL HIGH (ref 70–99)
Glucose-Capillary: 228 mg/dL — ABNORMAL HIGH (ref 70–99)

## 2020-03-10 LAB — PROCALCITONIN: Procalcitonin: 5.03 ng/mL

## 2020-03-10 LAB — PROTIME-INR
INR: 2.5 — ABNORMAL HIGH (ref 0.8–1.2)
Prothrombin Time: 25.8 seconds — ABNORMAL HIGH (ref 11.4–15.2)

## 2020-03-10 LAB — MRSA PCR SCREENING: MRSA by PCR: NEGATIVE

## 2020-03-10 LAB — APTT: aPTT: 43 seconds — ABNORMAL HIGH (ref 24–36)

## 2020-03-10 LAB — AMMONIA: Ammonia: 56 umol/L — ABNORMAL HIGH (ref 9–35)

## 2020-03-10 LAB — PHOSPHORUS: Phosphorus: 6 mg/dL — ABNORMAL HIGH (ref 2.5–4.6)

## 2020-03-10 LAB — MAGNESIUM: Magnesium: 2.7 mg/dL — ABNORMAL HIGH (ref 1.7–2.4)

## 2020-03-10 MED ORDER — HYDROCORTISONE NA SUCCINATE PF 100 MG IJ SOLR
50.0000 mg | Freq: Three times a day (TID) | INTRAMUSCULAR | Status: DC
  Administered 2020-03-10 – 2020-03-13 (×9): 50 mg via INTRAVENOUS
  Filled 2020-03-10 (×10): qty 2

## 2020-03-10 MED ORDER — PIPERACILLIN-TAZOBACTAM 3.375 G IVPB
3.3750 g | Freq: Three times a day (TID) | INTRAVENOUS | Status: DC
  Administered 2020-03-10 – 2020-03-14 (×13): 3.375 g via INTRAVENOUS
  Filled 2020-03-10 (×13): qty 50

## 2020-03-10 MED ORDER — CHLORHEXIDINE GLUCONATE CLOTH 2 % EX PADS
6.0000 | MEDICATED_PAD | Freq: Every day | CUTANEOUS | Status: DC
  Administered 2020-03-10 – 2020-03-12 (×3): 6 via TOPICAL

## 2020-03-10 NOTE — ED Notes (Signed)
Attending Provider at bedside.

## 2020-03-10 NOTE — ED Notes (Signed)
Provider aware of Pt blood pressure

## 2020-03-10 NOTE — Progress Notes (Signed)
Responded to nursing call:  Low BP   Subjective: Patient states he feels better than this am.  Patient denies fevers, chills, headache, chest pain, dyspnea, nausea, vomiting, diarrhea, abdominal pain, dysuria, hematuria, hematochezia, and melena.   Vitals:   03/10/20 1000 03/10/20 1200 03/10/20 1308 03/10/20 1424  BP: 97/67 93/64 (!) 81/51 (!) 79/53  Pulse: 62 62 62 71  Resp: 14 15 15 19   Temp:      TempSrc:      SpO2: 97% 98% 94% 100%  Weight:      Height:       CV--RRR Lung--bibasilar rales Abd--soft+BS/NT   Assessment/Plan: Hypotension -I have recheck BP--85/59 and 87/59; HR 65; sat 98% 2L -patient states his BP has been running low at home for past 6 months--"top number in 80s and 90s" -continue currently treatment plan -add stress steroids     Orson Eva, DO [facility]

## 2020-03-10 NOTE — Progress Notes (Signed)
PROGRESS NOTE  John [name] HWE:[id number] DOB: 1945/11/27 DOA: 03/09/2020 PCP: Erven Colla, DO  Brief History:  74 year old male with a history of systolic and diastolic CHF, diabetes mellitus type 2, hypertension, hyperlipidemia,NASH cirrhosis, complete heart block status post permanent pacemaker, persistent atrial fibrillation presenting with 2 to 3-week history of shortness of breath, coughing, congestion, and orthopnea type symptoms.  Unfortunately, the patient is a poor historian.  His wife assists with the history.  She states that he has had intermittent abdominal pain for the past month.  She states that occasionally his abdominal pain is relieved by bowel movement.  He has not had any vomiting, diarrhea, hematochezia, melena, dysuria, hematuria.  He has not had any fevers, chills, chest pain, headache, neck pain.  There is been no hemoptysis.  She states that he has been compliant with his medications, but she has not able to clarify some of the frequency and dosages of his medications. In the emergency department, the patient was afebrile and hypotensive initially.  Oxygen saturation was initially down to 88% on room air.  The patient was placed on 4 L nasal cannula.  BMP showed a serum creatinine 2.43.  AST forty-seven, ALT twenty-five, alk phosphatase 122, total bilirubin 4.7, lipase thirty-seven.  WBC 14.6, hemoglobin 13.1, platelets 80,000.  CT abdomen and pelvis showed confluent pulmonary passages in the right lower lobe and lingula.  There was layering gallstones without biliary duct dilatation.  There were signs of liver cirrhosis.  There is mild peripancreatic stranding.  CT of the brain was negative for any acute findings.  Right upper quadrant ultrasound showed sludge and gallstones in the gallbladder with slight gallbladder wall distention.  There is no biliary ductal dilatation.  Chest x-ray showed right lower lobe opacity with chronic interstitial  changes.  Assessment/Plan: Acute respiratory failure with hypoxia -Secondary to pulmonary edema and pneumonia -Currently stable on 2 L nasal cannula -Initially 88% on room air -Wean oxygen as tolerated  Lobar pneumonia -Personally reviewed chest x-ray--right lower lobe opacity, increased interstitial markings -Continue Zosyn and doxy  Symptomatic cholelithiasis -Patient does not appear to be a good candidate for surgical intervention -03/09/2020 right upper quadrant ultrasound--sludge and stones in the gallbladder with gallbladder wall thickening -Continue empiric antibiotics, Zosyn  Acute on chronic systolic and diastolic CHF -Start IV furosemide -Holding carvedilol secondary to hypotension and soft blood pressure -Accurate I's and O's -11/05/2019 TEE EF 35-40%, mild decreased RV function, moderate TR/MR, global HK -Holding spironolactone temporarily secondary to soft blood pressure  Acute on chronic renal failure--CKD stage IIIb -Baseline creatinine 1.5-1.8 -Secondary to infectious process and hemodynamic changes -Monitor closely with diuresis -Presented with serum creatinine 2.38  hyperammonemia -Secondary to chronic hepatic congestion and NASH -Patient did have an episode of increased somnolence in the emergency department -Repeat ammonia -Continue daily lactulose  Diabetes mellitus type 2 -NovoLog sliding scale -Holding [place] and Trulicity -check E9F  Persistent atrial fibrillation -Rate controlled -Continue apixaban -Holding carvedilol secondary to soft blood pressure  Complete heart block -Status post permanent pacemaker  Hyperlipidemia -Holding statin temporarily  Thrombocytopenia -due to infection and cirrohsis -monitor for signs of bleeding  Hyponatremia -due to fluid overload  Goals of Care -confirmed with spouse--DNR      Status is: Inpatient  Remains inpatient appropriate because:IV treatments appropriate due to intensity of illness  or inability to take PO   Dispo: The patient is from: Home  Anticipated d/c is to: Home              Anticipated d/c date is: 3 days              Patient currently is not medically stable to d/c.        Family Communication:   Spouse updated 12/16  Consultants:  General surgery  Code Status:  DNR  DVT Prophylaxis:  apixaban   Procedures: As Listed in Progress Note Above  Antibiotics: Zosyn 12/16>> Doxy 12/16>>     Subjective: Patient denies fevers, chills, headache, chest pain, dyspnea, nausea, vomiting, diarrhea, abdominal pain, dysuria, hematuria, hematochezia, and melena.   Objective: Vitals:   03/10/20 0315 03/10/20 0330 03/10/20 0345 03/10/20 0400  BP: 96/65 93/68 98/70  96/65  Pulse: (!) 57 60 63 (!) 59  Resp: 14 19 18 12   Temp:      TempSrc:      SpO2: 99% 99% 99% 99%  Weight:      Height:        Intake/Output Summary (Last 24 hours) at 03/10/2020 0733 Last data filed at 03/10/2020 0549 Gross per 24 hour  Intake 3242.48 ml  Output --  Net 3242.48 ml   Weight change:  Exam:   General:  Pt is alert, follows commands appropriately, not in acute distress  HEENT: No icterus, No thrush, No neck mass, [place]/AT  Cardiovascular: RRR, S1/S2, no rubs, no gallops  Respiratory: bibasilar crackles. No wheeze  Abdomen: Soft/+BS, non tender, non distended, no guarding  Extremities: 1+LE edema, No lymphangitis, No petechiae, No rashes, no synovitis   Data Reviewed: I have personally reviewed following labs and imaging studies Basic Metabolic Panel: Recent Labs  Lab 03/09/20 1544 03/10/20 0322  NA 129* 131*  K 3.6 3.8  CL 91* 95*  CO2 25 24  GLUCOSE 92 148*  BUN 70* 73*  CREATININE 2.38* 2.43*  CALCIUM 8.9 8.4*  MG  --  2.7*  PHOS  --  6.0*   Liver Function Tests: Recent Labs  Lab 03/09/20 1544 03/10/20 0322  AST 57* 47*  ALT 25 25  ALKPHOS 167* 122  BILITOT 4.9* 4.7*  PROT 7.9 7.0  ALBUMIN 3.4* 2.8*   Recent Labs   Lab 03/09/20 1544  LIPASE 37   Recent Labs  Lab 03/09/20 1845  AMMONIA 64*   Coagulation Profile: Recent Labs  Lab 03/10/20 0322  INR 2.5*   CBC: Recent Labs  Lab 03/09/20 1544 03/10/20 0322  WBC 9.5 14.6*  NEUTROABS 8.1*  --   HGB 13.9 13.1  HCT 41.4 38.6*  MCV 91.6 93.2  PLT 101* 80*   Cardiac Enzymes: No results for input(s): CKTOTAL, CKMB, CKMBINDEX, TROPONINI in the last 168 hours. BNP: Invalid input(s): POCBNP CBG: Recent Labs  Lab 03/09/20 1823 03/09/20 2145  GLUCAP 110* 88   HbA1C: No results for input(s): HGBA1C in the last 72 hours. Urine analysis:    Component Value Date/Time   COLORURINE YELLOW 08/19/2018 1120   APPEARANCEUR CLEAR 08/19/2018 1120   LABSPEC 1.014 08/19/2018 1120   PHURINE 5.0 08/19/2018 1120   GLUCOSEU NEGATIVE 08/19/2018 1120   HGBUR NEGATIVE 08/19/2018 1120   BILIRUBINUR NEGATIVE 08/19/2018 1120   [place] 08/19/2018 1120   PROTEINUR NEGATIVE 08/19/2018 1120   UROBILINOGEN 0.2 01/05/2014 0744   NITRITE NEGATIVE 08/19/2018 1120   LEUKOCYTESUR NEGATIVE 08/19/2018 1120   Sepsis Labs: @LABRCNTIP (procalcitonin:4,lacticidven:4) ) Recent Results (from the past 240 hour(s))  Resp Panel by RT-PCR (Flu A&B, Covid) Nasopharyngeal  Swab     Status: None   Collection Time: 03/09/20  3:34 PM   Specimen: Nasopharyngeal Swab; Nasopharyngeal(NP) swabs in vial transport medium  Result Value Ref Range Status   SARS Coronavirus 2 by RT PCR NEGATIVE NEGATIVE Final    Comment: (NOTE) SARS-CoV-2 target nucleic acids are NOT DETECTED.  The SARS-CoV-2 RNA is generally detectable in upper respiratory specimens during the acute phase of infection. The lowest concentration of SARS-CoV-2 viral copies this assay can detect is 138 copies/mL. A negative result does not preclude SARS-Cov-2 infection and should not be used as the sole basis for treatment or other patient management decisions. A negative result may occur with  improper  specimen collection/handling, submission of specimen other than nasopharyngeal swab, presence of viral mutation(s) within the areas targeted by this assay, and inadequate number of viral copies(<138 copies/mL). A negative result must be combined with clinical observations, patient history, and epidemiological information. The expected result is Negative.  Fact Sheet for Patients:  EntrepreneurPulse.com.au  Fact Sheet for Healthcare Providers:  IncredibleEmployment.be  This test is no t yet approved or cleared by the [place] FDA and  has been authorized for detection and/or diagnosis of SARS-CoV-2 by FDA under an Emergency Use Authorization (EUA). This EUA will remain  in effect (meaning this test can be used) for the duration of the COVID-19 declaration under Section 564(b)(1) of the Act, 21 U.S.C.section 360bbb-3(b)(1), unless the authorization is terminated  or revoked sooner.       Influenza A by PCR NEGATIVE NEGATIVE Final   Influenza B by PCR NEGATIVE NEGATIVE Final    Comment: (NOTE) The Xpert Xpress SARS-CoV-2/FLU/RSV plus assay is intended as an aid in the diagnosis of influenza from Nasopharyngeal swab specimens and should not be used as a sole basis for treatment. Nasal washings and aspirates are unacceptable for Xpert Xpress SARS-CoV-2/FLU/RSV testing.  Fact Sheet for Patients: EntrepreneurPulse.com.au  Fact Sheet for Healthcare Providers: IncredibleEmployment.be  This test is not yet approved or cleared by the [place] FDA and has been authorized for detection and/or diagnosis of SARS-CoV-2 by FDA under an Emergency Use Authorization (EUA). This EUA will remain in effect (meaning this test can be used) for the duration of the COVID-19 declaration under Section 564(b)(1) of the Act, 21 U.S.C. section 360bbb-3(b)(1), unless the authorization is terminated or revoked.  Performed at  [facility], [address] [place], [place] [address]   Culture, blood (routine x 2)     Status: None (Preliminary result)   Collection Time: 03/09/20  5:00 PM   Specimen: BLOOD LEFT ARM  Result Value Ref Range Status   Specimen Description BLOOD LEFT ARM  Final   Special Requests   Final    BOTTLES DRAWN AEROBIC AND ANAEROBIC Blood Culture results may not be optimal due to an inadequate volume of blood received in culture bottles   Culture   Final    NO GROWTH < 24 HOURS Performed at [facility], [address], [place], [place] [address]    Report Status PENDING  Incomplete  Culture, blood (routine x 2)     Status: None (Preliminary result)   Collection Time: 03/09/20  5:00 PM   Specimen: BLOOD LEFT HAND  Result Value Ref Range Status   Specimen Description BLOOD LEFT HAND  Final   Special Requests   Final    BOTTLES DRAWN AEROBIC AND ANAEROBIC Blood Culture adequate volume   Culture   Final    NO GROWTH < 24  HOURS Performed at [facility], [address] [place], [place] [address]    Report Status PENDING  Incomplete     Scheduled Meds: . apixaban  5 mg Oral BID  . calamine   Topical BID  . feeding supplement (GLUCERNA SHAKE)  237 mL Oral TID BM  . insulin aspart  0-5 Units Subcutaneous QHS  . insulin aspart  0-9 Units Subcutaneous TID WC  . lactulose  20 g Oral TID   Continuous Infusions: . ampicillin-sulbactam (UNASYN) IV Stopped (03/10/20 0549)  . azithromycin Stopped (03/09/20 1839)  . doxycycline (VIBRAMYCIN) IV      Procedures/Studies: CT ABDOMEN PELVIS WO CONTRAST  Result Date: 03/09/2020 CLINICAL DATA:  74 year old male with epigastric pain. EXAM: CT ABDOMEN AND PELVIS WITHOUT CONTRAST TECHNIQUE: Multidetector CT imaging of the abdomen and pelvis was performed following the standard protocol without IV contrast. COMPARISON:  CT abdomen pelvis dated 10/29/2004. abdominal ultrasound dated 03/09/2020. FINDINGS: Evaluation of this exam is limited in the absence  of intravenous contrast. Lower chest: Extensive confluent pulmonary opacities primarily involving the right lung as well as lingula most consistent with multifocal pneumonia. Clinical correlation and follow-up to resolution recommended. There is mild cardiomegaly. Cardiac pacemaker wires noted. No intra-abdominal free air. Small perihepatic free fluid. Hepatobiliary: Cirrhosis. No intrahepatic biliary dilatation. Layering gallstones. No pericholecystic fluid or evidence of acute cholecystitis by CT. Pancreas: Mild peripancreatic haziness. Correlation with pancreatic enzymes recommended to evaluate for possibility of acute pancreatitis. No fluid collection. Spleen: Normal in size without focal abnormality. Adrenals/Urinary Tract: The adrenal glands unremarkable. There is no hydronephrosis or nephrolithiasis on either side. The visualized ureters appear unremarkable. The urinary bladder is collapsed. Stomach/Bowel: There is diffuse colonic diverticulosis without active inflammatory changes. There is no bowel obstruction or active inflammation. The appendix is normal. There is a 5.1 x 3.6 cm fluid attenuating structure in the mesentery along the inferior aspect of the third portion of the duodenum. There are areas of calcifications along the wall of this structure. This was present on the CT of 2006 but slightly larger on today's exam. 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 Vascular/Lymphatic: There is advanced aortoiliac atherosclerotic disease. The IVC is unremarkable. No portal venous gas. There is no adenopathy. Reproductive: The prostate and seminal vesicles are grossly unremarkable. No pelvic masses. Prominent pelvic vasculature. Other: Bilateral fat containing inguinal hernias. Musculoskeletal: Osteopenia with degenerative changes of the spine. No acute osseous pathology. IMPRESSION: 1. Multifocal pneumonia. Clinical correlation and follow-up to resolution  recommended. 2. Cirrhosis with small perihepatic free fluid. 3. Cholelithiasis. 4. Colonic diverticulosis. No bowel obstruction. Normal appendix. 5. Aortic Atherosclerosis (ICD10-I70.0). Electronically Signed   By: Anner Crete M.D.   On: 03/09/2020 18:49   CT Head Wo Contrast  Result Date: 03/09/2020 CLINICAL DATA:  Mental status changes. EXAM: CT HEAD WITHOUT CONTRAST TECHNIQUE: Contiguous axial images were obtained from the base of the skull through the vertex without intravenous contrast. COMPARISON:  None FINDINGS: Brain: No evidence of acute infarction, hemorrhage, hydrocephalus, extra-axial collection or mass lesion/mass effect. There is mild diffuse low-attenuation within the subcortical and periventricular white matter compatible with chronic microvascular disease. Prominence of the sulci and ventricles compatible with brain atrophy. Vascular: No hyperdense vessel or unexpected calcification. Skull: Normal. Negative for fracture or focal lesion. Sinuses/Orbits: No acute finding. Other: None. IMPRESSION: 1. No acute intracranial abnormalities. 2. Chronic small vessel ischemic change and brain atrophy. Electronically Signed   By: Kerby Moors M.D.   On: 03/09/2020  18:47   DG Chest Portable 1 View  Result Date: 03/09/2020 CLINICAL DATA:  Shortness of breath. Nausea and abdominal pain. Hypoxia. EXAM: PORTABLE CHEST 1 VIEW COMPARISON:  Most recent radiograph 11/11/2018 FINDINGS: Dual lead left-sided pacemaker in place. Chronic cardiomegaly. Unchanged mediastinal contours. Vascular congestion and suggestion of pulmonary edema. There is also patchy opacity at the right lung base. No pneumothorax. No large pleural effusion. No acute osseous abnormalities are seen. IMPRESSION: 1. Cardiomegaly with vascular congestion and suggestion of pulmonary edema. 2. More focal opacity at the right lung base is suspicious for pneumonia, asymmetric edema or atelectasis felt less likely. Electronically Signed   By:  Keith Rake M.D.   On: 03/09/2020 16:09   US Abdomen Limited RUQ (LIVER/GB)  Result Date: 03/09/2020 CLINICAL DATA:  Right upper quadrant pain EXAM: ULTRASOUND ABDOMEN LIMITED RIGHT UPPER QUADRANT COMPARISON:  11/24/2019 FINDINGS: Gallbladder: Sludge and stones within the gallbladder. Slight increased gallbladder wall thickness of 4.3 mm. Positive sonographic Murphy. Common bile duct: Diameter: 3.7 mm Liver: Coarse liver with suspected contour nodularity/cirrhosis. No focal hepatic abnormality. Portal vein is patent on color Doppler imaging with normal direction of blood flow towards the liver. Other: None. IMPRESSION: 1. Sludge and stones in the gallbladder with slight wall thickening and positive sonographic Murphy, concerning for acute cholecystitis. Negative for biliary dilatation 2. Suspected liver cirrhosis. Electronically Signed   By: Donavan Foil M.D.   On: 03/09/2020 17:56    Orson Eva, DO  [facility]  If 7PM-7AM, please contact night-coverage [email or web address] Password [email or web address] 03/10/2020, 7:33 AM   LOS: 1 day

## 2020-03-10 NOTE — ED Notes (Signed)
Report called to [facility] on [facility]U

## 2020-03-10 NOTE — Consult Note (Signed)
[place]  Reason for Consult:? Cholecystitis versus cholelithiasis  Referring Physician:  Dr. Carles Collet   Chief Complaint    Abdominal Pain      HPI: John [name] is a 74 y.o. male with CHF, A flutter, DM, NASH Cirrhosis, heart block with pacemaker, who came in with SOB and congestion. He also reported some intermittent abdominal pain versus nausea and constipation.  He denied any vomiting or diarrhea, and when I saw him this AM complained of no pain. He was worked up in the ED and found to have pneumonia and gallstones with concern for possible gallbladder wall thickening.    He says he has no pain with meals or eating and that he mostly has had nausea.   Past Medical History:  Diagnosis Date  . Atrial flutter ([place]) 12/2010   Admitted with symptomatic bradycardia (HR 40s), atrial flutter with slow ventricular response 12/2010 + volume overload; AV nodal agents d/c'd and Pradaxa started; RFA in 01/2011  . CHF (congestive heart failure) ([place])   . Chronic combined systolic and diastolic heart failure ([place])    a. echo 01/06/11: mild LVH, EF 65-70%, mod to severe LAE, mild RVE, mild RAE, PASP 32;   TEE 10/12: EF 45-50% b. EF 45 to 50% by echo in 07/2018  . Class 2 severe obesity due to excess calories with serious comorbidity and body mass index (BMI) of 37.0 to 37.9 in adult ([place]) 07/17/2017  . Diabetes mellitus    non insulin dependant  . Hyperlipidemia   . Hypertension   . Osteoarthritis   . Presence of permanent cardiac pacemaker   . PSVT (paroxysmal supraventricular tachycardia) (HCC)    Possibly atrial flutter    Past Surgical History:  Procedure Laterality Date  . ATRIAL FLUTTER ABLATION N/A 02/07/2011   Procedure: ATRIAL FLUTTER ABLATION;  Surgeon: Evans Lance, MD;  Location: [facility] CATH LAB;  Service: Cardiovascular;  Laterality: N/A;  . CARDIAC ELECTROPHYSIOLOGY STUDY AND ABLATION  02/07/11  . CARDIOVERSION N/A 03/23/2016   Procedure:  CARDIOVERSION;  Surgeon: Evans Lance, MD;  Location: [place];  Service: Cardiovascular;  Laterality: N/A;  . COLONOSCOPY N/A 04/17/2017   Procedure: COLONOSCOPY;  Surgeon: Rogene Houston, MD;  [facility];  Service: Endoscopy;  Laterality: N/A;  830  . EP IMPLANTABLE DEVICE N/A 08/29/2015   Procedure: Pacemaker Implant;  Surgeon: Evans Lance, MD;  Location: [place] CV LAB;  Service: Cardiovascular;  Laterality: N/A;  . EP IMPLANTABLE DEVICE N/A 12/07/2015   Procedure: PPM Lead Revision/Repair;  Surgeon: Evans Lance, MD;  Location: [facility] CV LAB;  Service: Cardiovascular;  Laterality: N/A;  . KNEE ARTHROSCOPY  2005   left  . LUMBAR SPINE SURGERY     "I've had 6 ORs 1972 thru 2004"  . POLYPECTOMY  04/17/2017   Procedure: POLYPECTOMY;  Surgeon: Rogene Houston, MD;  [facility];  Service: Endoscopy;;  transverse colon x3;  . RIGHT/LEFT HEART CATH AND CORONARY ANGIOGRAPHY N/A 12/21/2019   Procedure: RIGHT/LEFT HEART CATH AND CORONARY ANGIOGRAPHY;  Surgeon: Nelva Bush, MD;  Location: [place] CV LAB;  Service: Cardiovascular;  Laterality: N/A;  . TEE WITHOUT CARDIOVERSION N/A 11/05/2019   Procedure: TRANSESOPHAGEAL ECHOCARDIOGRAM (TEE) WITH PROPOFOL;  Surgeon: Arnoldo Lenis, MD;  [facility];  Service: Endoscopy;  Laterality: N/A;    Family History  Problem Relation Age of Onset  . Heart attack Mother   . Hypertension Mother   .  Heart attack Father   . Hypertension Father   . Heart attack Brother   . Colon cancer Neg Hx     Social History   Tobacco Use  . Smoking status: Former Smoker    Packs/day: 1.00    Years: 10.00    Pack years: 10.00    Types: Cigarettes    Quit date: 03/26/1986    Years since quitting: 33.9  . Smokeless tobacco: Never Used  . Tobacco comment: "stopped cigarette  smoking 1988"  Vaping Use  . Vaping Use: Never used  Substance Use Topics  . Alcohol use: Never    Alcohol/week: 0.0 standard  drinks    Comment: "quit alcohol ~ 2007"  . Drug use: No    Medications:  Current Facility-Administered Medications  Medication Dose Route Frequency Provider Last Rate Last Admin  . apixaban (ELIQUIS) tablet 5 mg  5 mg Oral BID Adefeso, Oladapo, DO   5 mg at 03/10/20 0907  . calamine lotion   Topical BID Bernadette Hoit, DO   Given at 03/10/20 3016  . Chlorhexidine Gluconate Cloth 2 % PADS 6 each  6 each Topical Daily Tat, David, MD      . doxycycline (VIBRAMYCIN) 100 mg in sodium chloride 0.9 % 250 mL IVPB  100 mg Intravenous Q12H Adefeso, Oladapo, DO   Stopped at 03/10/20 1119  . feeding supplement (GLUCERNA SHAKE) (GLUCERNA SHAKE) liquid 237 mL  237 mL Oral TID BM Adefeso, Oladapo, DO   237 mL at 03/10/20 0924  . hydrocortisone sodium succinate (SOLU-CORTEF) 100 MG injection 50 mg  50 mg Intravenous Franco Collet, MD   50 mg at 03/10/20 1527  . insulin aspart (novoLOG) injection 0-5 Units  0-5 Units Subcutaneous QHS Adefeso, Oladapo, DO      . insulin aspart (novoLOG) injection 0-9 Units  0-9 Units Subcutaneous TID WC Adefeso, Oladapo, DO   3 Units at 03/10/20 1154  . lactulose (CHRONULAC) 10 GM/15ML solution 20 g  20 g Oral TID Adefeso, Oladapo, DO   20 g at 03/10/20 0907  . piperacillin-tazobactam (ZOSYN) IVPB 3.375 g  3.375 g Intravenous Franco Collet, MD 12.5 mL/hr at 03/10/20 1419 3.375 g at 03/10/20 1419  . prochlorperazine (COMPAZINE) injection 10 mg  10 mg Intravenous Q6H PRN Adefeso, Oladapo, DO       Current Outpatient Medications  Medication Sig Dispense Refill Last Dose  . apixaban (ELIQUIS) 5 MG TABS tablet Take 1 tablet (5 mg total) by mouth 2 (two) times daily. 180 tablet 3 Past Week at 1800  . benzonatate (TESSALON) 100 MG capsule Take 1 capsule (100 mg total) by mouth 3 (three) times daily as needed for cough. 90 capsule 3 Past Week at Unknown time  . carvedilol (COREG) 25 MG tablet Take 1 tablet (25 mg total) by mouth 2 (two) times daily with a meal. 180 tablet 3 Past  Week at 1800  . HYDROcodone-acetaminophen (NORCO/VICODIN) 5-325 MG tablet Take one tablet po every 4 hrs prn pain (Patient taking differently: Take 1 tablet by mouth every 4 (four) hours as needed for moderate pain.) 28 tablet 0 Past Month at Unknown time  . hydrocortisone 2.5 % cream Apply BID to affected area (Patient taking differently: Apply 1 application topically 2 (two) times daily as needed (skin irritation.).) 60 g 3 Past Week at Unknown time  . insulin degludec (TRESIBA FLEXTOUCH) 200 UNIT/ML FlexTouch Pen Inject 50 Units into the skin at bedtime. (Patient taking differently: Inject 60 Units into the skin  at bedtime.) 30 mL 1 Past Week at Unknown time  . Multiple Vitamin (MULTIVITAMIN WITH MINERALS) TABS tablet Take 1 tablet by mouth daily. One A Day for Men   Past Month at Unknown time  . potassium chloride SA (KLOR-CON) 20 MEQ tablet Take 60 meq in the am, at lunch, and in the evening (Patient taking differently: Take 60 mEq by mouth daily.) 270 tablet 6 Past Week at Unknown time  . spironolactone (ALDACTONE) 25 MG tablet Take 0.5 tablets (12.5 mg total) by mouth daily. 45 tablet 1 Past Week at Unknown time  . torsemide (DEMADEX) 20 MG tablet TAKE 2 TABLETS (40 MG TOTAL) BY MOUTH 2 (TWO) TIMES DAILY. 360 tablet 1 Past Week at Unknown time  . TRULICITY 1.74 YO/3.7CH SOPN INJECT 1.5MG (1 PEN) SUBCUTANEOUSLY EVERY WEEK (Patient taking differently: Inject 1.5 mg into the skin every 7 (seven) days.) 6 mL 0 03/06/2020  . Zinc 50 MG TABS Take 50 mg by mouth daily.   Past Month at Unknown time  . ACCU-CHEK AVIVA PLUS test strip TEST BLOOD SUGAR TWICE DAILY BEFORE BREAKFAST AND AT BEDTIME 200 strip 1   . acetaminophen (TYLENOL) 500 MG tablet Take 500 mg by mouth every 6 (six) hours as needed.   unknown  . Insulin Pen Needle (BD PEN NEEDLE NANO U/F) 32G X 4 MM MISC 1 each by Does not apply route 4 (four) times daily. 150 each 5   . pravastatin (PRAVACHOL) 20 MG tablet Take 1 tablet (20 mg total) by  mouth every evening. (Patient not taking: No sig reported) 90 tablet 1 Not Taking at Unknown time  . sildenafil (VIAGRA) 50 MG tablet Take 1 tablet (50 mg total) by mouth daily as needed for erectile dysfunction. 30 tablet 3 unknown   No Known Allergies   ROS:  A comprehensive review of systems was negative except for: Respiratory: positive for cough, pneumonia and congestion Gastrointestinal: positive for abdominal pain, constipation and nausea  Blood pressure (!) 80/62, pulse 60, temperature 98.4 F (36.9 C), temperature source Oral, resp. rate 17, height 6' (1.829 m), weight 99.8 kg, SpO2 100 %. Physical Exam Vitals reviewed.  Constitutional:      Appearance: He is well-developed.  HENT:     Head: Normocephalic.  Eyes:     Extraocular Movements: Extraocular movements intact.  Cardiovascular:     Rate and Rhythm: Normal rate. Rhythm irregular.  Pulmonary:     Effort: Pulmonary effort is normal.  Abdominal:     General: There is no distension.     Palpations: Abdomen is soft.     Tenderness: There is no abdominal tenderness.  Musculoskeletal:     Comments: Moves all extremities  Skin:    General: Skin is warm.  Neurological:     General: No focal deficit present.     Mental Status: He is alert and oriented to person, place, and time.  Psychiatric:        Mood and Affect: Mood normal.     Results: Results for orders placed or performed during the hospital encounter of 03/09/20 (from the past 48 hour(s))  Resp Panel by RT-PCR (Flu A&B, Covid) Nasopharyngeal Swab     Status: None   Collection Time: 03/09/20  3:34 PM   Specimen: Nasopharyngeal Swab; Nasopharyngeal(NP) swabs in vial transport medium  Result Value Ref Range   SARS Coronavirus 2 by RT PCR NEGATIVE NEGATIVE    Comment: (NOTE) SARS-CoV-2 target nucleic acids are NOT DETECTED.  The SARS-CoV-2 RNA  is generally detectable in upper respiratory specimens during the acute phase of infection. The  lowest concentration of SARS-CoV-2 viral copies this assay can detect is 138 copies/mL. A negative result does not preclude SARS-Cov-2 infection and should not be used as the sole basis for treatment or other patient management decisions. A negative result may occur with  improper specimen collection/handling, submission of specimen other than nasopharyngeal swab, presence of viral mutation(s) within the areas targeted by this assay, and inadequate number of viral copies(<138 copies/mL). A negative result must be combined with clinical observations, patient history, and epidemiological information. The expected result is Negative.  Fact Sheet for Patients:  EntrepreneurPulse.com.au  Fact Sheet for Healthcare Providers:  IncredibleEmployment.be  This test is no t yet approved or cleared by the [place] FDA and  has been authorized for detection and/or diagnosis of SARS-CoV-2 by FDA under an Emergency Use Authorization (EUA). This EUA will remain  in effect (meaning this test can be used) for the duration of the COVID-19 declaration under Section 564(b)(1) of the Act, 21 U.S.C.section 360bbb-3(b)(1), unless the authorization is terminated  or revoked sooner.       Influenza A by PCR NEGATIVE NEGATIVE   Influenza B by PCR NEGATIVE NEGATIVE    Comment: (NOTE) The Xpert Xpress SARS-CoV-2/FLU/RSV plus assay is intended as an aid in the diagnosis of influenza from Nasopharyngeal swab specimens and should not be used as a sole basis for treatment. Nasal washings and aspirates are unacceptable for Xpert Xpress SARS-CoV-2/FLU/RSV testing.  Fact Sheet for Patients: EntrepreneurPulse.com.au  Fact Sheet for Healthcare Providers: IncredibleEmployment.be  This test is not yet approved or cleared by the [place] FDA and has been authorized for detection and/or diagnosis of SARS-CoV-2 by FDA under an Emergency  Use Authorization (EUA). This EUA will remain in effect (meaning this test can be used) for the duration of the COVID-19 declaration under Section 564(b)(1) of the Act, 21 U.S.C. section 360bbb-3(b)(1), unless the authorization is terminated or revoked.  Performed at [facility], [address] [place], [place] [address]   Comprehensive metabolic panel     Status: Abnormal   Collection Time: 03/09/20  3:44 PM  Result Value Ref Range   Sodium 129 (L) 135 - 145 mmol/L   Potassium 3.6 3.5 - 5.1 mmol/L   Chloride 91 (L) 98 - 111 mmol/L   CO2 25 22 - 32 mmol/L   Glucose, Bld 92 70 - 99 mg/dL    Comment: Glucose reference range applies only to samples taken after fasting for at least 8 hours.   BUN 70 (H) 8 - 23 mg/dL   Creatinine, Ser 2.38 (H) 0.61 - 1.24 mg/dL   Calcium 8.9 8.9 - 10.3 mg/dL   Total Protein 7.9 6.5 - 8.1 g/dL   Albumin 3.4 (L) 3.5 - 5.0 g/dL   AST 57 (H) 15 - 41 U/L   ALT 25 0 - 44 U/L   Alkaline Phosphatase 167 (H) 38 - 126 U/L   Total Bilirubin 4.9 (H) 0.3 - 1.2 mg/dL   GFR, Estimated 28 (L) >60 mL/min    Comment: (NOTE) Calculated using the CKD-EPI Creatinine Equation (2021)    Anion gap 13 5 - 15    Comment: Performed at [facility], [address] [place], [place] [address]  Lipase, blood     Status: None   Collection Time: 03/09/20  3:44 PM  Result Value Ref Range   Lipase 37 11 - 51 U/L    Comment:  Performed at [facility], [address] [place], [place] [address]  Brain natriuretic peptide     Status: Abnormal   Collection Time: 03/09/20  3:44 PM  Result Value Ref Range   B Natriuretic Peptide 1,540.0 (H) 0.0 - 100.0 pg/mL    Comment: Performed at [facility], [address], [place], [place] [address]  Troponin I (High Sensitivity)     Status: Abnormal   Collection Time: 03/09/20  3:44 PM  Result Value Ref Range   Troponin I (High Sensitivity) 39 (H) <18 ng/L    Comment: (NOTE) Elevated high sensitivity troponin I (hsTnI) values and significant   changes across serial measurements may suggest ACS but many other  chronic and acute conditions are known to elevate hsTnI results.  Refer to the Links section for chest pain algorithms and additional  guidance. Performed at [facility], [address] [place], [place] [address]   CBC with Differential     Status: Abnormal   Collection Time: 03/09/20  3:44 PM  Result Value Ref Range   WBC 9.5 4.0 - 10.5 K/uL   RBC 4.52 4.22 - 5.81 MIL/uL   Hemoglobin 13.9 13.0 - 17.0 g/dL   HCT 41.4 39.0 - 52.0 %   MCV 91.6 80.0 - 100.0 fL   MCH 30.8 26.0 - 34.0 pg   MCHC 33.6 30.0 - 36.0 g/dL   RDW 17.3 (H) 11.5 - 15.5 %   Platelets 101 (L) 150 - 400 K/uL    Comment: PLATELET COUNT CONFIRMED BY SMEAR SPECIMEN CHECKED FOR CLOTS    nRBC 0.0 0.0 - 0.2 %   Neutrophils Relative % 85 %   Neutro Abs 8.1 (H) 1.7 - 7.7 K/uL   Lymphocytes Relative 7 %   Lymphs Abs 0.7 0.7 - 4.0 K/uL   Monocytes Relative 6 %   Monocytes Absolute 0.5 0.1 - 1.0 K/uL   Eosinophils Relative 1 %   Eosinophils Absolute 0.1 0.0 - 0.5 K/uL   Basophils Relative 1 %   Basophils Absolute 0.1 0.0 - 0.1 K/uL   Immature Granulocytes 0 %   Abs Immature Granulocytes 0.04 0.00 - 0.07 K/uL    Comment: Performed at [facility], [address] [place], [place] [address]  Hemoglobin A1c     Status: Abnormal   Collection Time: 03/09/20  3:44 PM  Result Value Ref Range   Hgb A1c MFr Bld 6.8 (H) 4.8 - 5.6 %    Comment: (NOTE) Pre diabetes:          5.7%-6.4%  Diabetes:              >6.4%  Glycemic control for   <7.0% adults with diabetes    Mean Plasma Glucose 148.46 mg/dL    Comment: Performed at [facility], [address], [place], [place] [address]  Culture, blood (routine x 2)     Status: None (Preliminary result)   Collection Time: 03/09/20  5:00 PM   Specimen: BLOOD LEFT ARM  Result Value Ref Range   Specimen Description BLOOD LEFT ARM    Special Requests      BOTTLES DRAWN AEROBIC AND ANAEROBIC Blood Culture  results may not be optimal due to an inadequate volume of blood received in culture bottles   Culture      NO GROWTH < 24 HOURS Performed at [facility], [address] [place], [place] [address]    Report Status PENDING   Culture, blood (routine x 2)  Status: None (Preliminary result)   Collection Time: 03/09/20  5:00 PM   Specimen: BLOOD LEFT HAND  Result Value Ref Range   Specimen Description BLOOD LEFT HAND    Special Requests      BOTTLES DRAWN AEROBIC AND ANAEROBIC Blood Culture adequate volume   Culture      NO GROWTH < 24 HOURS Performed at [facility], [address], [place], [place] [address]    Report Status PENDING   CBG monitoring, ED     Status: Abnormal   Collection Time: 03/09/20  6:23 PM  Result Value Ref Range   Glucose-Capillary 110 (H) 70 - 99 mg/dL    Comment: Glucose reference range applies only to samples taken after fasting for at least 8 hours.  Troponin I (High Sensitivity)     Status: Abnormal   Collection Time: 03/09/20  6:45 PM  Result Value Ref Range   Troponin I (High Sensitivity) 39 (H) <18 ng/L    Comment: (NOTE) Elevated high sensitivity troponin I (hsTnI) values and significant  changes across serial measurements may suggest ACS but many other  chronic and acute conditions are known to elevate hsTnI results.  Refer to the "Links" section for chest pain algorithms and additional  guidance. Performed at [facility], [address], [place], [place] [address]   Ammonia     Status: Abnormal   Collection Time: 03/09/20  6:45 PM  Result Value Ref Range   Ammonia 64 (H) 9 - 35 umol/L    Comment: Performed at [facility], [address] [place], [place] [address]  CBG monitoring, ED     Status: None   Collection Time: 03/09/20  9:45 PM  Result Value Ref Range   Glucose-Capillary 88 70 - 99 mg/dL    Comment: Glucose reference range applies only to samples taken after fasting for at least 8 hours.   Comment 1 Notify RN    Comment 2 Document  in Chart   Comprehensive metabolic panel     Status: Abnormal   Collection Time: 03/10/20  3:22 AM  Result Value Ref Range   Sodium 131 (L) 135 - 145 mmol/L   Potassium 3.8 3.5 - 5.1 mmol/L   Chloride 95 (L) 98 - 111 mmol/L   CO2 24 22 - 32 mmol/L   Glucose, Bld 148 (H) 70 - 99 mg/dL    Comment: Glucose reference range applies only to samples taken after fasting for at least 8 hours.   BUN 73 (H) 8 - 23 mg/dL   Creatinine, Ser 2.43 (H) 0.61 - 1.24 mg/dL   Calcium 8.4 (L) 8.9 - 10.3 mg/dL   Total Protein 7.0 6.5 - 8.1 g/dL   Albumin 2.8 (L) 3.5 - 5.0 g/dL   AST 47 (H) 15 - 41 U/L   ALT 25 0 - 44 U/L   Alkaline Phosphatase 122 38 - 126 U/L   Total Bilirubin 4.7 (H) 0.3 - 1.2 mg/dL   GFR, Estimated 27 (L) >60 mL/min    Comment: (NOTE) Calculated using the CKD-EPI Creatinine Equation (2021)    Anion gap 12 5 - 15    Comment: Performed at [facility], [address] [place], [place] [address]  CBC     Status: Abnormal   Collection Time: 03/10/20  3:22 AM  Result Value Ref Range   WBC 14.6 (H) 4.0 - 10.5 K/uL   RBC 4.14 (L) 4.22 - 5.81 MIL/uL   Hemoglobin 13.1 13.0 - 17.0 g/dL   HCT  38.6 (L) 39.0 - 52.0 %   MCV 93.2 80.0 - 100.0 fL   MCH 31.6 26.0 - 34.0 pg   MCHC 33.9 30.0 - 36.0 g/dL   RDW 17.9 (H) 11.5 - 15.5 %   Platelets 80 (L) 150 - 400 K/uL    Comment: Immature Platelet Fraction may be clinically indicated, consider ordering this additional test [id number] CONSISTENT WITH PREVIOUS RESULT    nRBC 0.0 0.0 - 0.2 %    Comment: Performed at [facility], [address] [place], [place] [address]  Protime-INR     Status: Abnormal   Collection Time: 03/10/20  3:22 AM  Result Value Ref Range   Prothrombin Time 25.8 (H) 11.4 - 15.2 seconds   INR 2.5 (H) 0.8 - 1.2    Comment: (NOTE) INR goal varies based on device and disease states. Performed at [facility], [address], [place], [place] [address]   APTT     Status: Abnormal   Collection Time: 03/10/20  3:22 AM   Result Value Ref Range   aPTT 43 (H) 24 - 36 seconds    Comment:        IF BASELINE aPTT IS ELEVATED, SUGGEST PATIENT RISK ASSESSMENT BE USED TO DETERMINE APPROPRIATE ANTICOAGULANT THERAPY. Performed at [facility], [address] [place], [place] [address]   Magnesium     Status: Abnormal   Collection Time: 03/10/20  3:22 AM  Result Value Ref Range   Magnesium 2.7 (H) 1.7 - 2.4 mg/dL    Comment: Performed at [facility], [address], [place], [place] [address]  Phosphorus     Status: Abnormal   Collection Time: 03/10/20  3:22 AM  Result Value Ref Range   Phosphorus 6.0 (H) 2.5 - 4.6 mg/dL    Comment: Performed at [facility], [address], [place],  [address]  Procalcitonin - Baseline     Status: None   Collection Time: 03/10/20  3:22 AM  Result Value Ref Range   Procalcitonin 5.03 ng/mL    Comment:        Interpretation: PCT > 2 ng/mL: Systemic infection (sepsis) is likely, unless other causes are known. (NOTE)       Sepsis PCT Algorithm           Lower Respiratory Tract                                      Infection PCT Algorithm    ----------------------------     ----------------------------         PCT < 0.25 ng/mL                PCT < 0.10 ng/mL          Strongly encourage             Strongly discourage   discontinuation of antibiotics    initiation of antibiotics    ----------------------------     -----------------------------       PCT 0.25 - 0.50 ng/mL            PCT 0.10 - 0.25 ng/mL               OR       >80% decrease in PCT            Discourage initiation of  antibiotics      Encourage discontinuation           of antibiotics    ----------------------------     -----------------------------         PCT >= 0.50 ng/mL              PCT 0.26 - 0.50 ng/mL               AND       <80% decrease in PCT              Encourage initiation of                                             antibiotics        Encourage continuation           of antibiotics    ----------------------------     -----------------------------        PCT >= 0.50 ng/mL                  PCT > 0.50 ng/mL               AND         increase in PCT                  Strongly encourage                                      initiation of antibiotics    Strongly encourage escalation           of antibiotics                                     -----------------------------                                           PCT <= 0.25 ng/mL                                                 OR                                        > 80% decrease in PCT                                      Discontinue / Do not initiate                                             antibiotics  Performed at [facility], [address] [place], [place] [address]   Ammonia     Status: Abnormal   Collection Time: 03/10/20  7:49 AM  Result Value Ref Range   Ammonia 56 (H) 9 - 35 umol/L    Comment: Performed at [facility], [address], [place], [place] [address]  CBG monitoring, ED     Status: Abnormal   Collection Time: 03/10/20  7:52 AM  Result Value Ref Range   Glucose-Capillary 136 (H) 70 - 99 mg/dL    Comment: Glucose reference range applies only to samples taken after fasting for at least 8 hours.   Comment 1 Notify RN   CBG monitoring, ED     Status: Abnormal   Collection Time: 03/10/20 11:28 AM  Result Value Ref Range   Glucose-Capillary 228 (H) 70 - 99 mg/dL    Comment: Glucose reference range applies only to samples taken after fasting for at least 8 hours.   Personally reviewed CT and US- gallbladder with stones some minor wall thickening CT ABDOMEN PELVIS WO CONTRAST  Result Date: 03/09/2020 CLINICAL DATA:  74 year old male with epigastric pain. EXAM: CT ABDOMEN AND PELVIS WITHOUT CONTRAST TECHNIQUE: Multidetector CT imaging of the abdomen and pelvis was performed following the standard protocol without IV contrast. COMPARISON:  CT  abdomen pelvis dated 10/29/2004. abdominal ultrasound dated 03/09/2020. FINDINGS: Evaluation of this exam is limited in the absence of intravenous contrast. Lower chest: Extensive confluent pulmonary opacities primarily involving the right lung as well as lingula most consistent with multifocal pneumonia. Clinical correlation and follow-up to resolution recommended. There is mild cardiomegaly. Cardiac pacemaker wires noted. No intra-abdominal free air. Small perihepatic free fluid. Hepatobiliary: Cirrhosis. No intrahepatic biliary dilatation. Layering gallstones. No pericholecystic fluid or evidence of acute cholecystitis by CT. Pancreas: Mild peripancreatic haziness. Correlation with pancreatic enzymes recommended to evaluate for possibility of acute pancreatitis. No fluid collection. Spleen: Normal in size without focal abnormality. Adrenals/Urinary Tract: The adrenal glands unremarkable. There is no hydronephrosis or nephrolithiasis on either side. The visualized ureters appear unremarkable. The urinary bladder is collapsed. Stomach/Bowel: There is diffuse colonic diverticulosis without active inflammatory changes. There is no bowel obstruction or active inflammation. The appendix is normal. There is a 5.1 x 3.6 cm fluid attenuating structure in the mesentery along the inferior aspect of the third portion of the duodenum. There are areas of calcifications along the wall of this structure. This was present on the CT of 2006 but slightly larger on today's exam. 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 Vascular/Lymphatic: There is advanced aortoiliac atherosclerotic disease. The IVC is unremarkable. No portal venous gas. There is no adenopathy. Reproductive: The prostate and seminal vesicles are grossly unremarkable. No pelvic masses. Prominent pelvic vasculature. Other: Bilateral fat containing inguinal hernias. Musculoskeletal: Osteopenia with degenerative changes of  the spine. No acute osseous pathology. IMPRESSION: 1. Multifocal pneumonia. Clinical correlation and follow-up to resolution recommended. 2. Cirrhosis with small perihepatic free fluid. 3. Cholelithiasis. 4. Colonic diverticulosis. No bowel obstruction. Normal appendix. 5. Aortic Atherosclerosis (ICD10-I70.0). Electronically Signed   By: Anner Crete M.D.   On: 03/09/2020 18:49   CT Head Wo Contrast  Result Date: 03/09/2020 CLINICAL DATA:  Mental status changes. EXAM: CT HEAD WITHOUT CONTRAST TECHNIQUE: Contiguous axial images were obtained from the base of the skull through the vertex without intravenous contrast. COMPARISON:  None FINDINGS: Brain: No evidence of acute infarction, hemorrhage, hydrocephalus, extra-axial collection or mass lesion/mass effect. There is mild diffuse low-attenuation within the subcortical and periventricular white matter compatible with chronic microvascular disease. Prominence of the sulci and ventricles compatible with brain atrophy. Vascular: No hyperdense  vessel or unexpected calcification. Skull: Normal. Negative for fracture or focal lesion. Sinuses/Orbits: No acute finding. Other: None. IMPRESSION: 1. No acute intracranial abnormalities. 2. Chronic small vessel ischemic change and brain atrophy. Electronically Signed   By: Kerby Moors M.D.   On: 03/09/2020 18:47   DG Chest Portable 1 View  Result Date: 03/09/2020 CLINICAL DATA:  Shortness of breath. Nausea and abdominal pain. Hypoxia. EXAM: PORTABLE CHEST 1 VIEW COMPARISON:  Most recent radiograph 11/11/2018 FINDINGS: Dual lead left-sided pacemaker in place. Chronic cardiomegaly. Unchanged mediastinal contours. Vascular congestion and suggestion of pulmonary edema. There is also patchy opacity at the right lung base. No pneumothorax. No large pleural effusion. No acute osseous abnormalities are seen. IMPRESSION: 1. Cardiomegaly with vascular congestion and suggestion of pulmonary edema. 2. More focal opacity at  the right lung base is suspicious for pneumonia, asymmetric edema or atelectasis felt less likely. Electronically Signed   By: Keith Rake M.D.   On: 03/09/2020 16:09   US Abdomen Limited RUQ (LIVER/GB)  Result Date: 03/09/2020 CLINICAL DATA:  Right upper quadrant pain EXAM: ULTRASOUND ABDOMEN LIMITED RIGHT UPPER QUADRANT COMPARISON:  11/24/2019 FINDINGS: Gallbladder: Sludge and stones within the gallbladder. Slight increased gallbladder wall thickness of 4.3 mm. Positive sonographic Murphy. Common bile duct: Diameter: 3.7 mm Liver: Coarse liver with suspected contour nodularity/cirrhosis. No focal hepatic abnormality. Portal vein is patent on color Doppler imaging with normal direction of blood flow towards the liver. Other: None. IMPRESSION: 1. Sludge and stones in the gallbladder with slight wall thickening and positive sonographic Murphy, concerning for acute cholecystitis. Negative for biliary dilatation 2. Suspected liver cirrhosis. Electronically Signed   By: Donavan Foil M.D.   On: 03/09/2020 17:56     Assessment & Plan:  IRWIN TORAN is a 74 y.o. male with gallstones that could be symptomatic at times but no symptoms currently and no pain. I do not think he has acute cholecystitis as he is not tender and some of the thickening and fluid could be from his CHF.  He is on broad spectrum antibiotics for his PNA and he would not be a surgical candidate given his comorbidities.   -If worried about cholecystitis and is not improving would get HIDA so we could confirm and get IR to place cholecystostomy tube -Clear diet ordered   All questions were answered to the satisfaction of the patient. Discussed with Dr. Carles Collet.     Virl Cagey 03/10/2020, 4:22 PM

## 2020-03-11 DIAGNOSIS — J181 Lobar pneumonia, unspecified organism: Principal | ICD-10-CM

## 2020-03-11 LAB — CBC WITH DIFFERENTIAL/PLATELET
Abs Immature Granulocytes: 0.06 10*3/uL (ref 0.00–0.07)
Basophils Absolute: 0 10*3/uL (ref 0.0–0.1)
Basophils Relative: 0 %
Eosinophils Absolute: 0 10*3/uL (ref 0.0–0.5)
Eosinophils Relative: 0 %
HCT: 38.3 % — ABNORMAL LOW (ref 39.0–52.0)
Hemoglobin: 13 g/dL (ref 13.0–17.0)
Immature Granulocytes: 1 %
Lymphocytes Relative: 7 %
Lymphs Abs: 0.7 10*3/uL (ref 0.7–4.0)
MCH: 31.3 pg (ref 26.0–34.0)
MCHC: 33.9 g/dL (ref 30.0–36.0)
MCV: 92.1 fL (ref 80.0–100.0)
Monocytes Absolute: 0.5 10*3/uL (ref 0.1–1.0)
Monocytes Relative: 5 %
Neutro Abs: 9.5 10*3/uL — ABNORMAL HIGH (ref 1.7–7.7)
Neutrophils Relative %: 87 %
Platelets: 80 10*3/uL — ABNORMAL LOW (ref 150–400)
RBC: 4.16 MIL/uL — ABNORMAL LOW (ref 4.22–5.81)
RDW: 17.9 % — ABNORMAL HIGH (ref 11.5–15.5)
WBC: 10.8 10*3/uL — ABNORMAL HIGH (ref 4.0–10.5)
nRBC: 0 % (ref 0.0–0.2)

## 2020-03-11 LAB — GLUCOSE, CAPILLARY
Glucose-Capillary: 153 mg/dL — ABNORMAL HIGH (ref 70–99)
Glucose-Capillary: 212 mg/dL — ABNORMAL HIGH (ref 70–99)
Glucose-Capillary: 217 mg/dL — ABNORMAL HIGH (ref 70–99)
Glucose-Capillary: 265 mg/dL — ABNORMAL HIGH (ref 70–99)

## 2020-03-11 LAB — COMPREHENSIVE METABOLIC PANEL
ALT: 25 U/L (ref 0–44)
AST: 45 U/L — ABNORMAL HIGH (ref 15–41)
Albumin: 2.8 g/dL — ABNORMAL LOW (ref 3.5–5.0)
Alkaline Phosphatase: 103 U/L (ref 38–126)
Anion gap: 13 (ref 5–15)
BUN: 80 mg/dL — ABNORMAL HIGH (ref 8–23)
CO2: 22 mmol/L (ref 22–32)
Calcium: 8.3 mg/dL — ABNORMAL LOW (ref 8.9–10.3)
Chloride: 94 mmol/L — ABNORMAL LOW (ref 98–111)
Creatinine, Ser: 2.62 mg/dL — ABNORMAL HIGH (ref 0.61–1.24)
GFR, Estimated: 25 mL/min — ABNORMAL LOW (ref >60)
Glucose, Bld: 173 mg/dL — ABNORMAL HIGH (ref 70–99)
Potassium: 3.5 mmol/L (ref 3.5–5.1)
Sodium: 129 mmol/L — ABNORMAL LOW (ref 135–145)
Total Bilirubin: 3.8 mg/dL — ABNORMAL HIGH (ref 0.3–1.2)
Total Protein: 7.1 g/dL (ref 6.5–8.1)

## 2020-03-11 MED ORDER — PROCHLORPERAZINE EDISYLATE 10 MG/2ML IJ SOLN: 5.0000 mg | Freq: Four times a day (QID) | INTRAMUSCULAR | Status: DC | PRN

## 2020-03-11 MED ORDER — LACTULOSE 10 GM/15ML PO SOLN
20.0000 g | Freq: Every day | ORAL | Status: DC
  Administered 2020-03-12 – 2020-03-13 (×2): 20 g via ORAL
  Filled 2020-03-11 (×2): qty 30

## 2020-03-11 MED ORDER — POTASSIUM CHLORIDE IN NACL 20-0.9 MEQ/L-% IV SOLN: INTRAVENOUS | Status: AC

## 2020-03-11 NOTE — Progress Notes (Signed)
[facility] Progress Note     Subjective: No abdominal pain and tolerated liquids.   Objective: Vital signs in last 24 hours: Temp:  [96.4 F (35.8 C)-98 F (36.7 C)] 96.4 F (35.8 C) (12/17 0434) Pulse Rate:  [59-71] 59 (12/17 0900) Resp:  [7-19] 14 (12/17 0900) BP: (71-96)/(50-66) 83/55 (12/17 0900) SpO2:  [94 %-100 %] 99 % (12/17 0900) Weight:  [103.1 kg-103.7 kg] 103.1 kg (12/17 0434) Last BM Date: 03/10/20  Intake/Output from previous day: 12/16 0701 - 12/17 0700 In: 287.8 [IV Piggyback:287.8] Out: -  Intake/Output this shift: No intake/output data recorded.  General appearance: alert, cooperative and no distress Resp: normal work of breathing GI: soft, nondistended, nontender  Lab Results:  Recent Labs    03/10/20 0322 03/11/20 0439  WBC 14.6* 10.8*  HGB 13.1 13.0  HCT 38.6* 38.3*  PLT 80* 80*   BMET Recent Labs    03/10/20 0322 03/11/20 0439  NA 131* 129*  K 3.8 3.5  CL 95* 94*  CO2 24 22  GLUCOSE 148* 173*  BUN 73* 80*  CREATININE 2.43* 2.62*  CALCIUM 8.4* 8.3*   PT/INR Recent Labs    03/10/20 0322  LABPROT 25.8*  INR 2.5*    Studies/Results: CT ABDOMEN PELVIS WO CONTRAST  Result Date: 03/09/2020 CLINICAL DATA:  74 year old male with epigastric pain. EXAM: CT ABDOMEN AND PELVIS WITHOUT CONTRAST TECHNIQUE: Multidetector CT imaging of the abdomen and pelvis was performed following the standard protocol without IV contrast. COMPARISON:  CT abdomen pelvis dated 10/29/2004. abdominal ultrasound dated 03/09/2020. FINDINGS: Evaluation of this exam is limited in the absence of intravenous contrast. Lower chest: Extensive confluent pulmonary opacities primarily involving the right lung as well as lingula most consistent with multifocal pneumonia. Clinical correlation and follow-up to resolution recommended. There is mild cardiomegaly. Cardiac pacemaker wires noted. No intra-abdominal free air. Small perihepatic free fluid.  Hepatobiliary: Cirrhosis. No intrahepatic biliary dilatation. Layering gallstones. No pericholecystic fluid or evidence of acute cholecystitis by CT. Pancreas: Mild peripancreatic haziness. Correlation with pancreatic enzymes recommended to evaluate for possibility of acute pancreatitis. No fluid collection. Spleen: Normal in size without focal abnormality. Adrenals/Urinary Tract: The adrenal glands unremarkable. There is no hydronephrosis or nephrolithiasis on either side. The visualized ureters appear unremarkable. The urinary bladder is collapsed. Stomach/Bowel: There is diffuse colonic diverticulosis without active inflammatory changes. There is no bowel obstruction or active inflammation. The appendix is normal. There is a 5.1 x 3.6 cm fluid attenuating structure in the mesentery along the inferior aspect of the third portion of the duodenum. There are areas of calcifications along the wall of this structure. This was present on the CT of 2006 but slightly larger on today's exam. 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 Vascular/Lymphatic: There is advanced aortoiliac atherosclerotic disease. The IVC is unremarkable. No portal venous gas. There is no adenopathy. Reproductive: The prostate and seminal vesicles are grossly unremarkable. No pelvic masses. Prominent pelvic vasculature. Other: Bilateral fat containing inguinal hernias. Musculoskeletal: Osteopenia with degenerative changes of the spine. No acute osseous pathology. IMPRESSION: 1. Multifocal pneumonia. Clinical correlation and follow-up to resolution recommended. 2. Cirrhosis with small perihepatic free fluid. 3. Cholelithiasis. 4. Colonic diverticulosis. No bowel obstruction. Normal appendix. 5. Aortic Atherosclerosis (ICD10-I70.0). Electronically Signed   By: Anner Crete M.D.   On: 03/09/2020 18:49   CT Head Wo Contrast  Result Date: 03/09/2020 CLINICAL DATA:  Mental status changes. EXAM: CT HEAD  WITHOUT CONTRAST TECHNIQUE: Contiguous axial images were  obtained from the base of the skull through the vertex without intravenous contrast. COMPARISON:  None FINDINGS: Brain: No evidence of acute infarction, hemorrhage, hydrocephalus, extra-axial collection or mass lesion/mass effect. There is mild diffuse low-attenuation within the subcortical and periventricular white matter compatible with chronic microvascular disease. Prominence of the sulci and ventricles compatible with brain atrophy. Vascular: No hyperdense vessel or unexpected calcification. Skull: Normal. Negative for fracture or focal lesion. Sinuses/Orbits: No acute finding. Other: None. IMPRESSION: 1. No acute intracranial abnormalities. 2. Chronic small vessel ischemic change and brain atrophy. Electronically Signed   By: Kerby Moors M.D.   On: 03/09/2020 18:47   DG Chest Portable 1 View  Result Date: 03/09/2020 CLINICAL DATA:  Shortness of breath. Nausea and abdominal pain. Hypoxia. EXAM: PORTABLE CHEST 1 VIEW COMPARISON:  Most recent radiograph 11/11/2018 FINDINGS: Dual lead left-sided pacemaker in place. Chronic cardiomegaly. Unchanged mediastinal contours. Vascular congestion and suggestion of pulmonary edema. There is also patchy opacity at the right lung base. No pneumothorax. No large pleural effusion. No acute osseous abnormalities are seen. IMPRESSION: 1. Cardiomegaly with vascular congestion and suggestion of pulmonary edema. 2. More focal opacity at the right lung base is suspicious for pneumonia, asymmetric edema or atelectasis felt less likely. Electronically Signed   By: Keith Rake M.D.   On: 03/09/2020 16:09   US Abdomen Limited RUQ (LIVER/GB)  Result Date: 03/09/2020 CLINICAL DATA:  Right upper quadrant pain EXAM: ULTRASOUND ABDOMEN LIMITED RIGHT UPPER QUADRANT COMPARISON:  11/24/2019 FINDINGS: Gallbladder: Sludge and stones within the gallbladder. Slight increased gallbladder wall thickness of 4.3 mm. Positive  sonographic Murphy. Common bile duct: Diameter: 3.7 mm Liver: Coarse liver with suspected contour nodularity/cirrhosis. No focal hepatic abnormality. Portal vein is patent on color Doppler imaging with normal direction of blood flow towards the liver. Other: None. IMPRESSION: 1. Sludge and stones in the gallbladder with slight wall thickening and positive sonographic Murphy, concerning for acute cholecystitis. Negative for biliary dilatation 2. Suspected liver cirrhosis. Electronically Signed   By: Donavan Foil M.D.   On: 03/09/2020 17:56    Anti-infectives: Anti-infectives (From admission, onward)   Start     Dose/Rate Route Frequency Ordered Stop   03/10/20 0800  doxycycline (VIBRAMYCIN) 100 mg in sodium chloride 0.9 % 250 mL IVPB        100 mg 125 mL/hr over 120 Minutes Intravenous Every 12 hours 03/09/20 2104 03/13/20 1959   03/10/20 0800  piperacillin-tazobactam (ZOSYN) IVPB 3.375 g        3.375 g 12.5 mL/hr over 240 Minutes Intravenous Every 8 hours 03/10/20 0747     03/09/20 2200  Ampicillin-Sulbactam (UNASYN) 3 g in sodium chloride 0.9 % 100 mL IVPB  Status:  Discontinued        3 g 200 mL/hr over 30 Minutes Intravenous Every 8 hours 03/09/20 2104 03/10/20 0747   03/09/20 1900  piperacillin-tazobactam (ZOSYN) IVPB 3.375 g        3.375 g 100 mL/hr over 30 Minutes Intravenous  Once 03/09/20 1857 03/09/20 2052   03/09/20 1630  cefTRIAXone (ROCEPHIN) 1 g in sodium chloride 0.9 % 100 mL IVPB        1 g 200 mL/hr over 30 Minutes Intravenous  Once 03/09/20 1621 03/09/20 1749   03/09/20 1630  azithromycin (ZITHROMAX) 500 mg in sodium chloride 0.9 % 250 mL IVPB  Status:  Discontinued        500 mg 250 mL/hr over 60 Minutes Intravenous Every 24 hours 03/09/20 1621 03/10/20 0747  Assessment/Plan: Mr. Remo is a 74 yo with gallstones and no symptoms concerning for cholecystitis. Being treated for PNA.  Diet as tolerated. High risk for any surgery given comorbidities    LOS: 2 days     Virl Cagey 03/11/2020

## 2020-03-11 NOTE — Progress Notes (Signed)
PROGRESS NOTE  John [name] JJH:[id number] DOB: [birth year] DOA: 03/09/2020 PCP: Erven Colla, DO   Brief History:  74 year old male with a history of systolic and diastolic CHF, diabetes mellitus type 2, hypertension, hyperlipidemia,NASH cirrhosis, complete heart block status post permanent pacemaker, persistent atrial fibrillation presenting with 2 to 3-week history of shortness of breath, coughing, congestion, and orthopnea type symptoms.  Unfortunately, the patient is a poor historian.  His wife assists with the history.  She states that he has had intermittent abdominal pain for the past month.  She states that occasionally his abdominal pain is relieved by bowel movement.  He has not had any vomiting, diarrhea, hematochezia, melena, dysuria, hematuria.  He has not had any fevers, chills, chest pain, headache, neck pain.  There is been no hemoptysis.  She states that he has been compliant with his medications, but she has not able to clarify some of the frequency and dosages of his medications. In the emergency department, the patient was afebrile and hypotensive initially.  Oxygen saturation was initially down to 88% on room air.  The patient was placed on 4 L nasal cannula.  BMP showed a serum creatinine 2.43.  AST forty-seven, ALT twenty-five, alk phosphatase 122, total bilirubin 4.7, lipase thirty-seven.  WBC 14.6, hemoglobin 13.1, platelets 80,000.  CT abdomen and pelvis showed confluent pulmonary passages in the right lower lobe and lingula.  There was layering gallstones without biliary duct dilatation.  There were signs of liver cirrhosis.  There is mild peripancreatic stranding.  CT of the brain was negative for any acute findings.  Right upper quadrant ultrasound showed sludge and gallstones in the gallbladder with slight gallbladder wall distention.  There is no biliary ductal dilatation.  Chest x-ray showed right lower lobe opacity with chronic interstitial  changes.  Assessment/Plan: Acute respiratory failure with hypoxia -Secondary to pulmonary edema and pneumonia -Currently stable on 2 L nasal cannula>>99% -Initially 88% on room air -Wean oxygen as tolerated  Lobar pneumonia -Personally reviewed chest x-ray--right lower lobe opacity, increased interstitial markings -Continue Zosyn and doxy  Symptomatic cholelithiasis -Patient does not appear to be a good candidate for surgical intervention -03/09/2020 right upper quadrant ultrasound--sludge and stones in the gallbladder with gallbladder wall thickening -Continue empiric antibiotics, Zosyn -start low rate IVF  Acute on chronic systolic and diastolic CHF -holding IV lasix--he appears hypovolemic -Holding carvedilol secondary to hypotension and soft blood pressure -Accurate I's and O's -11/05/2019 TEE EF 35-40%, mild decreased RV function, moderate TR/MR, global HK -Holding spironolactone temporarily secondary to soft blood pressure  Acute on chronic renal failure--CKD stage IIIb -Baseline creatinine 1.5-1.8 -Secondary to infectious process and hemodynamic changes -Monitor closely with diuresis -Presented with serum creatinine 2.38  hyperammonemia -Secondary to chronic hepatic congestion and NASH -Patient did have an episode of increased somnolence in the emergency department -Repeat ammonia -Continue daily lactulose  Diabetes mellitus type 2 -NovoLog sliding scale -Holding [place] and Trul[place] -check Y1E--5.6  Persistent atrial fibrillation -Rate controlled -Continue apixaban -Holding carvedilol secondary to soft blood pressure  Complete heart block -Status post permanent pacemaker  Hyperlipidemia -Holding statin temporarily  Thrombocytopenia -due to infection and cirrohsis -monitor for signs of bleeding  Hyponatremia -due to CHF, now poor solute intake  Goals of Care -confirmed with spouse--DNR      Status is: Inpatient  Remains  inpatient appropriate because:IV treatments appropriate due to intensity of illness or inability to take PO   Dispo: The  patient is from: Home  Anticipated d/c is to: Home  Anticipated d/c date is: 3 days  Patient currently is not medically stable to d/c.        Family Communication:   Spouse updated 12/17  Consultants:  General surgery  Code Status:  DNR  DVT Prophylaxis:  apixaban   Procedures: As Listed in Progress Note Above  Antibiotics: Zosyn 12/16>> Doxy 12/16>>     Subjective: Patient is feeling better today.  Patient denies fevers, chills, headache, chest pain, dyspnea, nausea, vomiting, diarrhea, abdominal pain, dysuria, hematuria, hematochezia, and melena.   Objective: Vitals:   03/11/20 0900 03/11/20 1200 03/11/20 1300 03/11/20 1400  BP: (!) 83/55 96/62 (!) 87/62 (!) 80/61  Pulse: (!) 59 83 (!) 59 63  Resp: 14 19 16 18   Temp:  97.6 F (36.4 C)    TempSrc:  Oral    SpO2: 99% 98% 98% 100%  Weight:      Height:        Intake/Output Summary (Last 24 hours) at 03/11/2020 1519 Last data filed at 03/11/2020 1404 Gross per 24 hour  Intake 642.4 ml  Output --  Net 642.4 ml   Weight change: 3.909 kg Exam:   General:  Pt is alert, follows commands appropriately, not in acute distress  HEENT: No icterus, No thrush, No neck mass, [place]/AT  Cardiovascular: RRR, S1/S2, no rubs, no gallops  Respiratory: bibasilar crackles, R>L, no wheeze  Abdomen: Soft/+BS, non tender, non distended, no guarding  Extremities: No edema, No lymphangitis, No petechiae, No rashes, no synovitis   Data Reviewed: I have personally reviewed following labs and imaging studies Basic Metabolic Panel: Recent Labs  Lab 03/09/20 1544 03/10/20 0322 03/11/20 0439  NA 129* 131* 129*  K 3.6 3.8 3.5  CL 91* 95* 94*  CO2 25 24 22   GLUCOSE 92 148* 173*  BUN 70* 73* 80*  CREATININE 2.38* 2.43* 2.62*  CALCIUM 8.9 8.4* 8.3*   MG  --  2.7*  --   PHOS  --  6.0*  --    Liver Function Tests: Recent Labs  Lab 03/09/20 1544 03/10/20 0322 03/11/20 0439  AST 57* 47* 45*  ALT 25 25 25   ALKPHOS 167* 122 103  BILITOT 4.9* 4.7* 3.8*  PROT 7.9 7.0 7.1  ALBUMIN 3.4* 2.8* 2.8*   Recent Labs  Lab 03/09/20 1544  LIPASE 37   Recent Labs  Lab 03/09/20 1845 03/10/20 0749  AMMONIA 64* 56*   Coagulation Profile: Recent Labs  Lab 03/10/20 0322  INR 2.5*   CBC: Recent Labs  Lab 03/09/20 1544 03/10/20 0322 03/11/20 0439  WBC 9.5 14.6* 10.8*  NEUTROABS 8.1*  --  9.5*  HGB 13.9 13.1 13.0  HCT 41.4 38.6* 38.3*  MCV 91.6 93.2 92.1  PLT 101* 80* 80*   Cardiac Enzymes: No results for input(s): CKTOTAL, CKMB, CKMBINDEX, TROPONINI in the last 168 hours. BNP: Invalid input(s): POCBNP CBG: Recent Labs  Lab 03/10/20 1128 03/10/20 1642 03/10/20 2007 03/11/20 0737 03/11/20 1120  GLUCAP 228* 205* 195* 153* 212*   HbA1C: Recent Labs    03/09/20 1544  HGBA1C 6.8*   Urine analysis:    Component Value Date/Time   COLORURINE YELLOW 08/19/2018 1120   APPEARANCEUR CLEAR 08/19/2018 1120   LABSPEC 1.014 08/19/2018 1120   PHURINE 5.0 08/19/2018 1120   GLUCOSEU NEGATIVE 08/19/2018 [place] 08/19/2018 [place] 08/19/2018 [place] 08/19/2018 1120   PROTEINUR NEGATIVE 08/19/2018  1120   UROBILINOGEN 0.2 01/05/2014 0744   NITRITE NEGATIVE 08/19/2018 1120   LEUKOCYTESUR NEGATIVE 08/19/2018 1120   Sepsis Labs: @LABRCNTIP (procalcitonin:4,lacticidven:4) ) Recent Results (from the past 240 hour(s))  Resp Panel by RT-PCR (Flu A&B, Covid) Nasopharyngeal Swab     Status: None   Collection Time: 03/09/20  3:34 PM   Specimen: Nasopharyngeal Swab; Nasopharyngeal(NP) swabs in vial transport medium  Result Value Ref Range Status   SARS Coronavirus 2 by RT PCR NEGATIVE NEGATIVE Final    Comment: (NOTE) SARS-CoV-2 target nucleic acids are NOT DETECTED.  The  SARS-CoV-2 RNA is generally detectable in upper respiratory specimens during the acute phase of infection. The lowest concentration of SARS-CoV-2 viral copies this assay can detect is 138 copies/mL. A negative result does not preclude SARS-Cov-2 infection and should not be used as the sole basis for treatment or other patient management decisions. A negative result may occur with  improper specimen collection/handling, submission of specimen other than nasopharyngeal swab, presence of viral mutation(s) within the areas targeted by this assay, and inadequate number of viral copies(<138 copies/mL). A negative result must be combined with clinical observations, patient history, and epidemiological information. The expected result is Negative.  Fact Sheet for Patients:  EntrepreneurPulse.com.au  Fact Sheet for Healthcare Providers:  IncredibleEmployment.be  This test is no t yet approved or cleared by the [place] FDA and  has been authorized for detection and/or diagnosis of SARS-CoV-2 by FDA under an Emergency Use Authorization (EUA). This EUA will remain  in effect (meaning this test can be used) for the duration of the COVID-19 declaration under Section 564(b)(1) of the Act, 21 U.S.C.section 360bbb-3(b)(1), unless the authorization is terminated  or revoked sooner.       Influenza A by PCR NEGATIVE NEGATIVE Final   Influenza B by PCR NEGATIVE NEGATIVE Final    Comment: (NOTE) The Xpert Xpress SARS-CoV-2/FLU/RSV plus assay is intended as an aid in the diagnosis of influenza from Nasopharyngeal swab specimens and should not be used as a sole basis for treatment. Nasal washings and aspirates are unacceptable for Xpert Xpress SARS-CoV-2/FLU/RSV testing.  Fact Sheet for Patients: EntrepreneurPulse.com.au  Fact Sheet for Healthcare Providers: IncredibleEmployment.be  This test is not yet approved or  cleared by the [place] FDA and has been authorized for detection and/or diagnosis of SARS-CoV-2 by FDA under an Emergency Use Authorization (EUA). This EUA will remain in effect (meaning this test can be used) for the duration of the COVID-19 declaration under Section 564(b)(1) of the Act, 21 U.S.C. section 360bbb-3(b)(1), unless the authorization is terminated or revoked.  Performed at [facility], [address], [place], [place] [address]   Culture, blood (routine x 2)     Status: None (Preliminary result)   Collection Time: 03/09/20  5:00 PM   Specimen: BLOOD LEFT ARM  Result Value Ref Range Status   Specimen Description BLOOD LEFT ARM  Final   Special Requests   Final    BOTTLES DRAWN AEROBIC AND ANAEROBIC Blood Culture results may not be optimal due to an inadequate volume of blood received in culture bottles   Culture   Final    NO GROWTH 2 DAYS Performed at [facility], [address] [place], [place] [address]    Report Status PENDING  Incomplete  Culture, blood (routine x 2)     Status: None (Preliminary result)   Collection Time: 03/09/20  5:00 PM   Specimen: BLOOD LEFT HAND  Result Value Ref Range Status  Specimen Description BLOOD LEFT HAND  Final   Special Requests   Final    BOTTLES DRAWN AEROBIC AND ANAEROBIC Blood Culture adequate volume   Culture   Final    NO GROWTH 2 DAYS Performed at [facility], [address] [place], [place] [address]    Report Status PENDING  Incomplete  MRSA PCR Screening     Status: None   Collection Time: 03/10/20  4:18 PM   Specimen: Nasopharyngeal  Result Value Ref Range Status   MRSA by PCR NEGATIVE NEGATIVE Final    Comment:        The GeneXpert MRSA Assay (FDA approved for NASAL specimens only), is one component of a comprehensive MRSA colonization surveillance program. It is not intended to diagnose MRSA infection nor to guide or monitor treatment for MRSA infections. Performed at [facility], [address] [place], [place] [address]      Scheduled Meds:  apixaban  5 mg Oral BID   calamine   Topical BID   Chlorhexidine Gluconate Cloth  6 each Topical Daily   feeding supplement (GLUCERNA SHAKE)  237 mL Oral TID BM   hydrocortisone sod succinate (SOLU-CORTEF) inj  50 mg Intravenous Q8H   insulin aspart  0-5 Units Subcutaneous QHS   insulin aspart  0-9 Units Subcutaneous TID WC   lactulose  20 g Oral TID   Continuous Infusions:  doxycycline (VIBRAMYCIN) IV Stopped (03/11/20 1101)   piperacillin-tazobactam (ZOSYN)  IV Stopped (03/11/20 1000)    Procedures/Studies: CT ABDOMEN PELVIS WO CONTRAST  Result Date: 03/09/2020 CLINICAL DATA:  74 year old male with epigastric pain. EXAM: CT ABDOMEN AND PELVIS WITHOUT CONTRAST TECHNIQUE: Multidetector CT imaging of the abdomen and pelvis was performed following the standard protocol without IV contrast. COMPARISON:  CT abdomen pelvis dated 10/29/2004. abdominal ultrasound dated 03/09/2020. FINDINGS: Evaluation of this exam is limited in the absence of intravenous contrast. Lower chest: Extensive confluent pulmonary opacities primarily involving the right lung as well as lingula most consistent with multifocal pneumonia. Clinical correlation and follow-up to resolution recommended. There is mild cardiomegaly. Cardiac pacemaker wires noted. No intra-abdominal free air. Small perihepatic free fluid. Hepatobiliary: Cirrhosis. No intrahepatic biliary dilatation. Layering gallstones. No pericholecystic fluid or evidence of acute cholecystitis by CT. Pancreas: Mild peripancreatic haziness. Correlation with pancreatic enzymes recommended to evaluate for possibility of acute pancreatitis. No fluid collection. Spleen: Normal in size without focal abnormality. Adrenals/Urinary Tract: The adrenal glands unremarkable. There is no hydronephrosis or nephrolithiasis on either side. The visualized ureters appear unremarkable. The urinary bladder is collapsed.  Stomach/Bowel: There is diffuse colonic diverticulosis without active inflammatory changes. There is no bowel obstruction or active inflammation. The appendix is normal. There is a 5.1 x 3.6 cm fluid attenuating structure in the mesentery along the inferior aspect of the third portion of the duodenum. There are areas of calcifications along the wall of this structure. This was present on the CT of 2006 but slightly larger on today's exam. 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 Vascular/Lymphatic: There is advanced aortoiliac atherosclerotic disease. The IVC is unremarkable. No portal venous gas. There is no adenopathy. Reproductive: The prostate and seminal vesicles are grossly unremarkable. No pelvic masses. Prominent pelvic vasculature. Other: Bilateral fat containing inguinal hernias. Musculoskeletal: Osteopenia with degenerative changes of the spine. No acute osseous pathology. IMPRESSION: 1. Multifocal pneumonia. Clinical correlation and follow-up to resolution recommended. 2. Cirrhosis with small perihepatic free fluid. 3. Cholelithiasis. 4. Colonic diverticulosis. No bowel obstruction. Normal  appendix. 5. Aortic Atherosclerosis (ICD10-I70.0). Electronically Signed   By: Anner Crete M.D.   On: 03/09/2020 18:49   CT Head Wo Contrast  Result Date: 03/09/2020 CLINICAL DATA:  Mental status changes. EXAM: CT HEAD WITHOUT CONTRAST TECHNIQUE: Contiguous axial images were obtained from the base of the skull through the vertex without intravenous contrast. COMPARISON:  None FINDINGS: Brain: No evidence of acute infarction, hemorrhage, hydrocephalus, extra-axial collection or mass lesion/mass effect. There is mild diffuse low-attenuation within the subcortical and periventricular white matter compatible with chronic microvascular disease. Prominence of the sulci and ventricles compatible with brain atrophy. Vascular: No hyperdense vessel or unexpected  calcification. Skull: Normal. Negative for fracture or focal lesion. Sinuses/Orbits: No acute finding. Other: None. IMPRESSION: 1. No acute intracranial abnormalities. 2. Chronic small vessel ischemic change and brain atrophy. Electronically Signed   By: Kerby Moors M.D.   On: 03/09/2020 18:47   DG Chest Portable 1 View  Result Date: 03/09/2020 CLINICAL DATA:  Shortness of breath. Nausea and abdominal pain. Hypoxia. EXAM: PORTABLE CHEST 1 VIEW COMPARISON:  Most recent radiograph 11/11/2018 FINDINGS: Dual lead left-sided pacemaker in place. Chronic cardiomegaly. Unchanged mediastinal contours. Vascular congestion and suggestion of pulmonary edema. There is also patchy opacity at the right lung base. No pneumothorax. No large pleural effusion. No acute osseous abnormalities are seen. IMPRESSION: 1. Cardiomegaly with vascular congestion and suggestion of pulmonary edema. 2. More focal opacity at the right lung base is suspicious for pneumonia, asymmetric edema or atelectasis felt less likely. Electronically Signed   By: Keith Rake M.D.   On: 03/09/2020 16:09   US Abdomen Limited RUQ (LIVER/GB)  Result Date: 03/09/2020 CLINICAL DATA:  Right upper quadrant pain EXAM: ULTRASOUND ABDOMEN LIMITED RIGHT UPPER QUADRANT COMPARISON:  11/24/2019 FINDINGS: Gallbladder: Sludge and stones within the gallbladder. Slight increased gallbladder wall thickness of 4.3 mm. Positive sonographic Murphy. Common bile duct: Diameter: 3.7 mm Liver: Coarse liver with suspected contour nodularity/cirrhosis. No focal hepatic abnormality. Portal vein is patent on color Doppler imaging with normal direction of blood flow towards the liver. Other: None. IMPRESSION: 1. Sludge and stones in the gallbladder with slight wall thickening and positive sonographic Murphy, concerning for acute cholecystitis. Negative for biliary dilatation 2. Suspected liver cirrhosis. Electronically Signed   By: Donavan Foil M.D.   On: 03/09/2020 17:56     Orson Eva, DO  [facility]  If 7PM-7AM, please contact night-coverage [email or web address] Password [email or web address] 03/11/2020, 3:19 PM   LOS: 2 days

## 2020-03-12 LAB — COMPREHENSIVE METABOLIC PANEL
ALT: 20 U/L (ref 0–44)
AST: 35 U/L (ref 15–41)
Albumin: 2.7 g/dL — ABNORMAL LOW (ref 3.5–5.0)
Alkaline Phosphatase: 89 U/L (ref 38–126)
Anion gap: 10 (ref 5–15)
BUN: 78 mg/dL — ABNORMAL HIGH (ref 8–23)
CO2: 21 mmol/L — ABNORMAL LOW (ref 22–32)
Calcium: 7.7 mg/dL — ABNORMAL LOW (ref 8.9–10.3)
Chloride: 98 mmol/L (ref 98–111)
Creatinine, Ser: 2.42 mg/dL — ABNORMAL HIGH (ref 0.61–1.24)
GFR, Estimated: 27 mL/min — ABNORMAL LOW (ref >60)
Glucose, Bld: 145 mg/dL — ABNORMAL HIGH (ref 70–99)
Potassium: 4.5 mmol/L (ref 3.5–5.1)
Sodium: 129 mmol/L — ABNORMAL LOW (ref 135–145)
Total Bilirubin: 2.7 mg/dL — ABNORMAL HIGH (ref 0.3–1.2)
Total Protein: 6.7 g/dL (ref 6.5–8.1)

## 2020-03-12 LAB — MAGNESIUM: Magnesium: 2.5 mg/dL — ABNORMAL HIGH (ref 1.7–2.4)

## 2020-03-12 LAB — CBC
HCT: 36.2 % — ABNORMAL LOW (ref 39.0–52.0)
Hemoglobin: 12.4 g/dL — ABNORMAL LOW (ref 13.0–17.0)
MCH: 31.3 pg (ref 26.0–34.0)
MCHC: 34.3 g/dL (ref 30.0–36.0)
MCV: 91.4 fL (ref 80.0–100.0)
Platelets: 81 10*3/uL — ABNORMAL LOW (ref 150–400)
RBC: 3.96 MIL/uL — ABNORMAL LOW (ref 4.22–5.81)
RDW: 17.2 % — ABNORMAL HIGH (ref 11.5–15.5)
WBC: 9.2 10*3/uL (ref 4.0–10.5)
nRBC: 0 % (ref 0.0–0.2)

## 2020-03-12 LAB — GLUCOSE, CAPILLARY
Glucose-Capillary: 151 mg/dL — ABNORMAL HIGH (ref 70–99)
Glucose-Capillary: 185 mg/dL — ABNORMAL HIGH (ref 70–99)
Glucose-Capillary: 216 mg/dL — ABNORMAL HIGH (ref 70–99)
Glucose-Capillary: 256 mg/dL — ABNORMAL HIGH (ref 70–99)

## 2020-03-12 LAB — PROCALCITONIN: Procalcitonin: 3.87 ng/mL

## 2020-03-12 LAB — AMMONIA: Ammonia: 47 umol/L — ABNORMAL HIGH (ref 9–35)

## 2020-03-12 NOTE — Progress Notes (Signed)
PROGRESS NOTE  John [name] ZJI:[id number] DOB: [birth year] DOA: 03/09/2020 PCP: Erven Colla, DO  Brief History: 74 year old male with a history of systolic and diastolic CHF, diabetes mellitus type 2, hypertension, hyperlipidemia,NASHcirrhosis, complete heart block status post permanent pacemaker, persistent atrial fibrillation presenting with 2 to 3-week history of shortness of breath, coughing, congestion, and orthopnea type symptoms. Unfortunately, the patient is a poor historian. His wife assists with the history. She states that he has had intermittent abdominal pain for the past month. She states that occasionally his abdominal pain is relieved by bowel movement. He has not had any vomiting, diarrhea, hematochezia, melena, dysuria, hematuria. He has not had any fevers, chills, chest pain, headache, neck pain. There is been no hemoptysis. She states that he has been compliant with his medications, but she has not able to clarify some of the frequency and dosages of his medications. In the emergency department, the patient was afebrile and hypotensive initially. Oxygen saturation was initially down to 88% on room air. The patient was placed on 4 L nasal cannula. BMP showed a serum creatinine 2.43. AST forty-seven, ALT twenty-five, alk phosphatase 122, total bilirubin 4.7, lipase thirty-seven. WBC 14.6, hemoglobin 13.1, platelets 80,000. CT abdomen and pelvis showed confluent pulmonary passages in the right lower lobe and lingula. There was layering gallstones without biliary duct dilatation. There were signs of liver cirrhosis. There is mild peripancreatic stranding. CT of the brain was negative for any acute findings. Right upper quadrant ultrasound showed sludge and gallstones in the gallbladder with slight gallbladder wall distention. There is no biliary ductal dilatation. Chest x-ray showed right lower lobe opacity with chronic interstitial  changes.  Assessment/Plan: Acute respiratory failure with hypoxia -Secondary to pulmonary edema and pneumonia -Currently stable on 2 L nasal cannula>>99% -Initially 88% on room air -Wean oxygen as tolerated  Lobar pneumonia -Personally reviewed chest x-ray--right lower lobe opacity, increased interstitial markings -Continue Zosyn anddoxy  Symptomatic cholelithiasis -Patient does not appear to be a good candidate for surgical intervention -03/09/2020 right upper quadrant ultrasound--sludge and stones in the gallbladder with gallbladder wall thickening -Continue empiric antibiotics,Zosyn -continue low rate IVF  Acute on chronic systolic and diastolic CHF -continue holding IV lasix--he appears hypovolemic -Holding carvedilol secondary to hypotension and soft blood pressure -Accurate I's and O's -11/05/2019 TEE EF 35-40%, mild decreased RV function, moderate TR/MR, global HK -continue Holding spironolactone temporarily secondary to soft blood pressure  Acute on chronic renal failure--CKD stage IIIb -Baseline creatinine 1.5-1.8 -Secondary to infectious process and hemodynamic changes -Monitor closely with diuresis -serum creatinine peaked 2.62  hyperammonemia -Secondary to chronic hepatic congestion andNASH -Patient did have an episode of increased somnolence in the emergency department -Repeat ammonia -Continue daily lactulose  Diabetes mellitus type 2 -NovoLog sliding scale -Holding [place] and Trulicity -check F7P--1.0  Persistent atrial fibrillation -Rate controlled -Continue apixaban -Holding carvedilol secondary to soft blood pressure  Complete heart block -Status post permanent pacemaker  Hyperlipidemia -Holding statin temporarily  Thrombocytopenia -due to infection and cirrohsis -monitor for signs of bleeding  Hyponatremia -due to CHF, now poor solute intake  Goals of Care -confirmed with spouse--DNR      Status is:  Inpatient  Remains inpatient appropriate because:IV treatments appropriate due to intensity of illness or inability to take PO   Dispo: The patient is from:Home Anticipated d/c is [facility] Anticipated d/c date is: 2 days Patient currently is not medically stable to d/c.  Family Communication:Spouse updated 12/18  Consultants:General surgery  Code Status: DNR  DVT Prophylaxis:apixaban   Procedures: As Listed in Progress Note Above  Antibiotics: Zosyn 12/16>> Doxy 12/16>>      Subjective: Patient denies fevers, chills, headache, chest pain, dyspnea, nausea, vomiting, diarrhea, abdominal pain, dysuria, hematuria, hematochezia, and melena.   Objective: Vitals:   03/12/20 0900 03/12/20 1000 03/12/20 1116 03/12/20 1235  BP: (!) 88/75 105/64  98/68  Pulse: 70   69  Resp: _0 Temp:   97.6 F (36.4 C)   TempSrc:   Oral   SpO2: 100% 99%  96%  Weight:      Height:        Intake/Output Summary (Last 24 hours) at 03/12/2020 1326 Last data filed at 03/12/2020 0521 Gross per 24 hour  Intake 2049.26 ml  Output 550 ml  Net 1499.26 ml   Weight change:  Exam:   General:  Pt is alert, follows commands appropriately, not in acute distress  HEENT: No icterus, No thrush, No neck mass, [place]/AT  Cardiovascular: RRR, S1/S2, no rubs, no gallops  Respiratory: bibasilar crackles. No wheeze  Abdomen: Soft/+BS, non tender, non distended, no guarding  Extremities: trace LE edema, No lymphangitis, No petechiae, No rashes, no synovitis   Data Reviewed: I have personally reviewed following labs and imaging studies Basic Metabolic Panel: Recent Labs  Lab 03/09/20 1544 03/10/20 0322 03/11/20 0439 03/12/20 0345  NA 129* 131* 129* 129*  K 3.6 3.8 3.5 4.5  CL 91* 95* 94* 98  CO2 _1 21*  GLUCOSE 92 148* 173* 145*  BUN 70* 73* 80* 78*  CREATININE 2.38* 2.43* 2.62* 2.42*  CALCIUM 8.9  8.4* 8.3* 7.7*  MG  --  2.7*  --  2.5*  PHOS  --  6.0*  --   --    Liver Function Tests: Recent Labs  Lab 03/09/20 1544 03/10/20 0322 03/11/20 0439 03/12/20 0345  AST 57* 47* 45* 35  ALT _2 ALKPHOS 167* 122 103 89  BILITOT 4.9* 4.7* 3.8* 2.7*  PROT 7.9 7.0 7.1 6.7  ALBUMIN 3.4* 2.8* 2.8* 2.7*   Recent Labs  Lab 03/09/20 1544  LIPASE 37   Recent Labs  Lab 03/09/20 1845 03/10/20 0749 03/12/20 0544  AMMONIA 64* 56* 47*   Coagulation Profile: Recent Labs  Lab 03/10/20 0322  INR 2.5*   CBC: Recent Labs  Lab 03/09/20 1544 03/10/20 0322 03/11/20 0439 03/12/20 0345  WBC 9.5 14.6* 10.8* 9.2  NEUTROABS 8.1*  --  9.5*  --   HGB 13.9 13.1 13.0 12.4*  HCT 41.4 38.6* 38.3* 36.2*  MCV 91.6 93.2 92.1 91.4  PLT 101* 80* 80* 81*   Cardiac Enzymes: No results for input(s): CKTOTAL, CKMB, CKMBINDEX, TROPONINI in the last 168 hours. BNP: Invalid input(s): POCBNP CBG: Recent Labs  Lab 03/11/20 1120 03/11/20 1607 03/11/20 2121 03/12/20 0733 03/12/20 1115  GLUCAP 212* 217* 265* 151* 185*   HbA1C: Recent Labs    03/09/20 1544  HGBA1C 6.8*   Urine analysis:    Component Value Date/Time   COLORURINE YELLOW 08/19/2018 1120   APPEARANCEUR CLEAR 08/19/2018 1120   LABSPEC 1.014 08/19/2018 1120   PHURINE 5.0 08/19/2018 1120   GLUCOSEU NEGATIVE 08/19/2018 1120   HGBUR NEGATIVE 08/19/2018 [place] 08/19/2018 1120   KETONESUR NEGATIVE 08/19/2018 1120   PROTEINUR NEGATIVE 08/19/2018 1120   UROBILINOGEN 0.2 01/05/2014 0744   NITRITE NEGATIVE 08/19/2018 1120  LEUKOCYTESUR NEGATIVE 08/19/2018 1120   Sepsis Labs: _0 (procalcitonin:4,lacticidven:4) ) Recent Results (from the past 240 hour(s))  Resp Panel by RT-PCR (Flu A&B, Covid) Nasopharyngeal Swab     Status: None   Collection Time: 03/09/20  3:34 PM   Specimen: Nasopharyngeal Swab; Nasopharyngeal(NP) swabs in vial transport medium  Result Value Ref Range Status   SARS  Coronavirus 2 by RT PCR NEGATIVE NEGATIVE Final    Comment: (NOTE) SARS-CoV-2 target nucleic acids are NOT DETECTED.  The SARS-CoV-2 RNA is generally detectable in upper respiratory specimens during the acute phase of infection. The lowest concentration of SARS-CoV-2 viral copies this assay can detect is 138 copies/mL. A negative result does not preclude SARS-Cov-2 infection and should not be used as the sole basis for treatment or other patient management decisions. A negative result may occur with  improper specimen collection/handling, submission of specimen other than nasopharyngeal swab, presence of viral mutation(s) within the areas targeted by this assay, and inadequate number of viral copies(<138 copies/mL). A negative result must be combined with clinical observations, patient history, and epidemiological information. The expected result is Negative.  Fact Sheet for Patients:  EntrepreneurPulse.com.au  Fact Sheet for Healthcare Providers:  IncredibleEmployment.be  This test is no t yet approved or cleared by the [place] FDA and  has been authorized for detection and/or diagnosis of SARS-CoV-2 by FDA under an Emergency Use Authorization (EUA). This EUA will remain  in effect (meaning this test can be used) for the duration of the COVID-19 declaration under Section 564(b)(1) of the Act, 21 U.S.C.section 360bbb-3(b)(1), unless the authorization is terminated  or revoked sooner.       Influenza A by PCR NEGATIVE NEGATIVE Final   Influenza B by PCR NEGATIVE NEGATIVE Final    Comment: (NOTE) The Xpert Xpress SARS-CoV-2/FLU/RSV plus assay is intended as an aid in the diagnosis of influenza from Nasopharyngeal swab specimens and should not be used as a sole basis for treatment. Nasal washings and aspirates are unacceptable for Xpert Xpress SARS-CoV-2/FLU/RSV testing.  Fact Sheet for  Patients: EntrepreneurPulse.com.au  Fact Sheet for Healthcare Providers: IncredibleEmployment.be  This test is not yet approved or cleared by the [place] FDA and has been authorized for detection and/or diagnosis of SARS-CoV-2 by FDA under an Emergency Use Authorization (EUA). This EUA will remain in effect (meaning this test can be used) for the duration of the COVID-19 declaration under Section 564(b)(1) of the Act, 21 U.S.C. section 360bbb-3(b)(1), unless the authorization is terminated or revoked.  Performed at [facility], [address], [place], [place] [address]   Culture, blood (routine x 2)     Status: None (Preliminary result)   Collection Time: 03/09/20  5:00 PM   Specimen: BLOOD LEFT ARM  Result Value Ref Range Status   Specimen Description BLOOD LEFT ARM  Final   Special Requests   Final    BOTTLES DRAWN AEROBIC AND ANAEROBIC Blood Culture results may not be optimal due to an inadequate volume of blood received in culture bottles   Culture   Final    NO GROWTH 3 DAYS Performed at [facility], [address], [place], [place] [address]    Report Status PENDING  Incomplete  Culture, blood (routine x 2)     Status: None (Preliminary result)   Collection Time: 03/09/20  5:00 PM   Specimen: BLOOD LEFT HAND  Result Value Ref Range Status   Specimen Description BLOOD LEFT HAND  Final   Special Requests   Final  BOTTLES DRAWN AEROBIC AND ANAEROBIC Blood Culture adequate volume   Culture   Final    NO GROWTH 3 DAYS Performed at [facility], [address] [place], [place] [address]    Report Status PENDING  Incomplete  MRSA PCR Screening     Status: None   Collection Time: 03/10/20  4:18 PM   Specimen: Nasopharyngeal  Result Value Ref Range Status   MRSA by PCR NEGATIVE NEGATIVE Final    Comment:        The GeneXpert MRSA Assay (FDA approved for NASAL specimens only), is one component of a comprehensive MRSA  colonization surveillance program. It is not intended to diagnose MRSA infection nor to guide or monitor treatment for MRSA infections. Performed at [facility], [address] [place], [place] [address]      Scheduled Meds: . apixaban  5 mg Oral BID  . calamine   Topical BID  . Chlorhexidine Gluconate Cloth  6 each Topical Daily  . feeding supplement (GLUCERNA SHAKE)  237 mL Oral TID BM  . hydrocortisone sod succinate (SOLU-CORTEF) inj  50 mg Intravenous Q8H  . insulin aspart  0-5 Units Subcutaneous QHS  . insulin aspart  0-9 Units Subcutaneous TID WC  . lactulose  20 g Oral Daily   Continuous Infusions: . doxycycline (VIBRAMYCIN) IV 100 mg (03/12/20 0814)  . piperacillin-tazobactam (ZOSYN)  IV 3.375 g (03/12/20 0521)    Procedures/Studies: CT ABDOMEN PELVIS WO CONTRAST  Result Date: 03/09/2020 CLINICAL DATA:  74 year old male with epigastric pain. EXAM: CT ABDOMEN AND PELVIS WITHOUT CONTRAST TECHNIQUE: Multidetector CT imaging of the abdomen and pelvis was performed following the standard protocol without IV contrast. COMPARISON:  CT abdomen pelvis dated 10/29/2004. abdominal ultrasound dated 03/09/2020. FINDINGS: Evaluation of this exam is limited in the absence of intravenous contrast. Lower chest: Extensive confluent pulmonary opacities primarily involving the right lung as well as lingula most consistent with multifocal pneumonia. Clinical correlation and follow-up to resolution recommended. There is mild cardiomegaly. Cardiac pacemaker wires noted. No intra-abdominal free air. Small perihepatic free fluid. Hepatobiliary: Cirrhosis. No intrahepatic biliary dilatation. Layering gallstones. No pericholecystic fluid or evidence of acute cholecystitis by CT. Pancreas: Mild peripancreatic haziness. Correlation with pancreatic enzymes recommended to evaluate for possibility of acute pancreatitis. No fluid collection. Spleen: Normal in size without focal abnormality. Adrenals/Urinary  Tract: The adrenal glands unremarkable. There is no hydronephrosis or nephrolithiasis on either side. The visualized ureters appear unremarkable. The urinary bladder is collapsed. Stomach/Bowel: There is diffuse colonic diverticulosis without active inflammatory changes. There is no bowel obstruction or active inflammation. The appendix is normal. There is a 5.1 x 3.6 cm fluid attenuating structure in the mesentery along the inferior aspect of the third portion of the duodenum. There are areas of calcifications along the wall of this structure. This was present on the CT of 2006 but slightly larger on today's exam. 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 Vascular/Lymphatic: There is advanced aortoiliac atherosclerotic disease. The IVC is unremarkable. No portal venous gas. There is no adenopathy. Reproductive: The prostate and seminal vesicles are grossly unremarkable. No pelvic masses. Prominent pelvic vasculature. Other: Bilateral fat containing inguinal hernias. Musculoskeletal: Osteopenia with degenerative changes of the spine. No acute osseous pathology. IMPRESSION: 1. Multifocal pneumonia. Clinical correlation and follow-up to resolution recommended. 2. Cirrhosis with small perihepatic free fluid. 3. Cholelithiasis. 4. Colonic diverticulosis. No bowel obstruction. Normal appendix. 5. Aortic Atherosclerosis (ICD10-I70.0). Electronically Signed   By: Laren Everts.D.  On: 03/09/2020 18:49   CT Head Wo Contrast  Result Date: 03/09/2020 CLINICAL DATA:  Mental status changes. EXAM: CT HEAD WITHOUT CONTRAST TECHNIQUE: Contiguous axial images were obtained from the base of the skull through the vertex without intravenous contrast. COMPARISON:  None FINDINGS: Brain: No evidence of acute infarction, hemorrhage, hydrocephalus, extra-axial collection or mass lesion/mass effect. There is mild diffuse low-attenuation within the subcortical and periventricular white  matter compatible with chronic microvascular disease. Prominence of the sulci and ventricles compatible with brain atrophy. Vascular: No hyperdense vessel or unexpected calcification. Skull: Normal. Negative for fracture or focal lesion. Sinuses/Orbits: No acute finding. Other: None. IMPRESSION: 1. No acute intracranial abnormalities. 2. Chronic small vessel ischemic change and brain atrophy. Electronically Signed   By: Kerby Moors M.D.   On: 03/09/2020 18:47   DG Chest Portable 1 View  Result Date: 03/09/2020 CLINICAL DATA:  Shortness of breath. Nausea and abdominal pain. Hypoxia. EXAM: PORTABLE CHEST 1 VIEW COMPARISON:  Most recent radiograph 11/11/2018 FINDINGS: Dual lead left-sided pacemaker in place. Chronic cardiomegaly. Unchanged mediastinal contours. Vascular congestion and suggestion of pulmonary edema. There is also patchy opacity at the right lung base. No pneumothorax. No large pleural effusion. No acute osseous abnormalities are seen. IMPRESSION: 1. Cardiomegaly with vascular congestion and suggestion of pulmonary edema. 2. More focal opacity at the right lung base is suspicious for pneumonia, asymmetric edema or atelectasis felt less likely. Electronically Signed   By: Keith Rake M.D.   On: 03/09/2020 16:09   US Abdomen Limited RUQ (LIVER/GB)  Result Date: 03/09/2020 CLINICAL DATA:  Right upper quadrant pain EXAM: ULTRASOUND ABDOMEN LIMITED RIGHT UPPER QUADRANT COMPARISON:  11/24/2019 FINDINGS: Gallbladder: Sludge and stones within the gallbladder. Slight increased gallbladder wall thickness of 4.3 mm. Positive sonographic Murphy. Common bile duct: Diameter: 3.7 mm Liver: Coarse liver with suspected contour nodularity/cirrhosis. No focal hepatic abnormality. Portal vein is patent on color Doppler imaging with normal direction of blood flow towards the liver. Other: None. IMPRESSION: 1. Sludge and stones in the gallbladder with slight wall thickening and positive sonographic Murphy,  concerning for acute cholecystitis. Negative for biliary dilatation 2. Suspected liver cirrhosis. Electronically Signed   By: Donavan Foil M.D.   On: 03/09/2020 17:56    Orson Eva, DO  [facility]  If 7PM-7AM, please contact night-coverage [email or web address] Password [email or web address] 03/12/2020, 1:26 PM   LOS: 3 days

## 2020-03-13 LAB — BASIC METABOLIC PANEL
Anion gap: 10 (ref 5–15)
BUN: 79 mg/dL — ABNORMAL HIGH (ref 8–23)
CO2: 23 mmol/L (ref 22–32)
Calcium: 8.1 mg/dL — ABNORMAL LOW (ref 8.9–10.3)
Chloride: 95 mmol/L — ABNORMAL LOW (ref 98–111)
Creatinine, Ser: 2.15 mg/dL — ABNORMAL HIGH (ref 0.61–1.24)
GFR, Estimated: 32 mL/min — ABNORMAL LOW (ref >60)
Glucose, Bld: 220 mg/dL — ABNORMAL HIGH (ref 70–99)
Potassium: 3.2 mmol/L — ABNORMAL LOW (ref 3.5–5.1)
Sodium: 128 mmol/L — ABNORMAL LOW (ref 135–145)

## 2020-03-13 LAB — CBC
HCT: 38.1 % — ABNORMAL LOW (ref 39.0–52.0)
Hemoglobin: 12.7 g/dL — ABNORMAL LOW (ref 13.0–17.0)
MCH: 30.8 pg (ref 26.0–34.0)
MCHC: 33.3 g/dL (ref 30.0–36.0)
MCV: 92.3 fL (ref 80.0–100.0)
Platelets: 89 10*3/uL — ABNORMAL LOW (ref 150–400)
RBC: 4.13 MIL/uL — ABNORMAL LOW (ref 4.22–5.81)
RDW: 17.2 % — ABNORMAL HIGH (ref 11.5–15.5)
WBC: 8 10*3/uL (ref 4.0–10.5)
nRBC: 0 % (ref 0.0–0.2)

## 2020-03-13 LAB — GLUCOSE, CAPILLARY
Glucose-Capillary: 165 mg/dL — ABNORMAL HIGH (ref 70–99)
Glucose-Capillary: 214 mg/dL — ABNORMAL HIGH (ref 70–99)
Glucose-Capillary: 233 mg/dL — ABNORMAL HIGH (ref 70–99)
Glucose-Capillary: 200 mg/dL — ABNORMAL HIGH (ref 70–99)

## 2020-03-13 MED ORDER — POTASSIUM CHLORIDE IN NACL 20-0.9 MEQ/L-% IV SOLN
INTRAVENOUS | Status: AC
  Administered 2020-03-13: 21:00:00 75 mL/h via INTRAVENOUS

## 2020-03-13 MED ORDER — PREDNISONE 20 MG PO TABS
50.0000 mg | ORAL_TABLET | Freq: Every day | ORAL | Status: DC
  Administered 2020-03-14: 08:00:00 50 mg via ORAL
  Filled 2020-03-13: qty 1

## 2020-03-13 MED ORDER — POTASSIUM CHLORIDE CRYS ER 20 MEQ PO TBCR
40.0000 meq | EXTENDED_RELEASE_TABLET | Freq: Once | ORAL | Status: AC
  Administered 2020-03-13: 18:00:00 40 meq via ORAL
  Filled 2020-03-13: qty 2

## 2020-03-13 NOTE — Discharge Summary (Signed)
Physician Discharge Summary  NADEEM ROMANOSKI FBP:[id number] DOB: 1945/09/05 DOA: 03/09/2020  PCP: Erven Colla, DO  Admit date: 03/09/2020 Discharge date: 03/14/2020  Admitted From: Home Disposition:  Home   Recommendations for Outpatient Follow-up:  1. Follow up with PCP in 1-2 weeks 2. Please obtain BMP/CBC in one week   Home Health:  Equipment/Devices:  Discharge Condition: Stable CODE STATUS: DNR Diet recommendation: Heart Healthy / Carb Modified    Brief/Interim Summary: 74 year old male with a history of systolic and diastolic CHF, diabetes mellitus type 2, hypertension, hyperlipidemia,NASHcirrhosis, complete heart block status post permanent pacemaker, persistent atrial fibrillation presenting with 2 to 3-week history of shortness of breath, coughing, congestion, and orthopnea type symptoms. Unfortunately, the patient is a poor historian. His wife assists with the history. She states that he has had intermittent abdominal pain for the past month. She states that occasionally his abdominal pain is relieved by bowel movement. He has not had any vomiting, diarrhea, hematochezia, melena, dysuria, hematuria. He has not had any fevers, chills, chest pain, headache, neck pain. There is been no hemoptysis. She states that he has been compliant with his medications, but she has not able to clarify some of the frequency and dosages of his medications. In the emergency department, the patient was afebrile and hypotensive initially. Oxygen saturation was initially down to 88% on room air. The patient was placed on 4 L nasal cannula. BMP showed a serum creatinine 2.43. AST forty-seven, ALT twenty-five, alk phosphatase 122, total bilirubin 4.7, lipase thirty-seven. WBC 14.6, hemoglobin 13.1, platelets 80,000. CT abdomen and pelvis showed confluent pulmonary passages in the right lower lobe and lingula. There was layering gallstones without biliary duct dilatation. There were  signs of liver cirrhosis. There is mild peripancreatic stranding. CT of the brain was negative for any acute findings. Right upper quadrant ultrasound showed sludge and gallstones in the gallbladder with slight gallbladder wall distention. There is no biliary ductal dilatation. Chest x-ray showed right lower lobe opacity with chronic interstitial changes.  Discharge Diagnoses:  Acute respiratory failure with hypoxia -Secondary to pulmonary edema and pneumonia -Currently stable on 2 L>>RA -Initially 88% on room air -Wean oxygen as tolerated -initially started on stress steroids IV during hospitalization-->weaned to prednisone-->home with taper  Lobar pneumonia -Personally reviewed chest x-ray--right lower lobe opacity, increased interstitial markings -Continue Zosyn anddoxy -d/c home with 3 more days of amox/clav & doxy  Symptomatic cholelithiasis -Patient does not appear to be a good candidate for surgical intervention -03/09/2020 right upper quadrant ultrasound--sludge and stones in the gallbladder with gallbladder wall thickening -Continue empiric antibiotics,Zosyn -continuelow rate IVF during hospitalization  chronic systolic and diastolic CHF -continueholding IV lasix--he appears hypovolemic -Holding carvedilol secondary to hypotension and soft blood pressure and volume depletion -Accurate I's and O's -11/05/2019 TEE EF 35-40%, mild decreased RV function, moderate TR/MR, global HK -continueHolding spironolactone temporarily secondary to soft blood pressure -Holding spironolactone due to soft BPs -restart coreg at lower dose at time of d/c--3.125m bid -decrease torsemide to 40 mg once daily (previously bid)-->daily weights after d/c-->follow up with Dr. BHarl Bowiefor any adjustment -cut home dose KCl supplement in half-->60 to 30 daily  Acute on chronic renal failure--CKD stage IIIb -Baseline creatinine 1.5-1.8 -Secondary to infectious process and hemodynamic  changes -serum creatinine peaked 2.62 -torsemide on hold due to volume depletion -serum creatinine 2.07 at the time of d/c  hyperammonemia -Secondary to chronic hepatic congestion andNASH -Patient did have an episode of increased somnolence in the emergency department -pt  is awake and alert -d/c lactulose and monitor clinically -Repeat ammonia 64>>47  Diabetes mellitus type 2 -NovoLog sliding scale -Holding Tresiba and Trulicity during hospital -check A1C--6.8 -restart Tresiba at lower dose--10 units daily after d/c-->will need to keep glycemic log and follow up with PCP for adjustment  Persistent atrial fibrillation -Rate controlled -Continue apixaban -Holding carvedilol secondary to soft blood pressure -restart carvedilol after d/c at lower dose  Complete heart block -Status post permanent pacemaker  Hyperlipidemia -Holding statin temporarily--restart after d/c  Thrombocytopenia -due to infection and cirrohsis -monitor for signs of bleeding -overall improving  Hyponatremia -due toCHF, now poor solute intake -improved--132 on day of d/c  Goals of Care -confirmed with spouse--DNR    Discharge Instructions   Allergies as of 03/14/2020   No Known Allergies     Medication List    STOP taking these medications   pravastatin 20 MG tablet Commonly known as: PRAVACHOL   spironolactone 25 MG tablet Commonly known as: ALDACTONE     TAKE these medications   Accu-Chek Aviva Plus test strip Generic drug: glucose blood TEST BLOOD SUGAR TWICE DAILY BEFORE BREAKFAST AND AT BEDTIME   acetaminophen 500 MG tablet Commonly known as: TYLENOL Take 500 mg by mouth every 6 (six) hours as needed.   apixaban 5 MG Tabs tablet Commonly known as: Eliquis Take 1 tablet (5 mg total) by mouth 2 (two) times daily.   BD Pen Needle Nano U/F 32G X 4 MM Misc Generic drug: Insulin Pen Needle 1 each by Does not apply route 4 (four) times daily.   benzonatate 100 MG  capsule Commonly known as: TESSALON Take 1 capsule (100 mg total) by mouth 3 (three) times daily as needed for cough.   carvedilol 3.125 MG tablet Commonly known as: Coreg Take 1 tablet (3.125 mg total) by mouth 2 (two) times daily with a meal. What changed:   medication strength  how much to take   HYDROcodone-acetaminophen 5-325 MG tablet Commonly known as: NORCO/VICODIN Take one tablet po every 4 hrs prn pain What changed:   how much to take  how to take this  when to take this  reasons to take this  additional instructions   hydrocortisone 2.5 % cream Apply BID to affected area What changed:   how much to take  how to take this  when to take this  reasons to take this  additional instructions   multivitamin with minerals Tabs tablet Take 1 tablet by mouth daily. One A Day for Men   potassium chloride SA 20 MEQ tablet Commonly known as: KLOR-CON Take 1.5 tablets (30 mEq total) by mouth daily. Take 60 meq in the am, at lunch, and in the evening What changed:   how much to take  how to take this  when to take this   predniSONE 10 MG tablet Commonly known as: DELTASONE Take 5 tablets (50 mg total) by mouth daily with breakfast. And decrease by one tablet daily Start taking on: March 15, 2020   sildenafil 50 MG tablet Commonly known as: Viagra Take 1 tablet (50 mg total) by mouth daily as needed for erectile dysfunction.   torsemide 20 MG tablet Commonly known as: DEMADEX Take 2 tablets (40 mg total) by mouth daily. TAKE 2 TABLETS (40 MG TOTAL) BY MOUTH 2 (TWO) TIMES DAILY. What changed:   how much to take  how to take this  when to take this   Tresiba FlexTouch 200 UNIT/ML FlexTouch Pen Generic drug: insulin  degludec Inject 10 Units into the skin at bedtime. What changed: how much to take   Trulicity 1.5 GU/4.4IH Sopn Generic drug: Dulaglutide INJECT 1.5MG (1 PEN) SUBCUTANEOUSLY EVERY WEEK What changed: See the new instructions.    Zinc 50 MG Tabs Take 50 mg by mouth daily.       No Known Allergies  Consultations:  none   Procedures/Studies: CT ABDOMEN PELVIS WO CONTRAST  Result Date: 03/09/2020 CLINICAL DATA:  74 year old male with epigastric pain. EXAM: CT ABDOMEN AND PELVIS WITHOUT CONTRAST TECHNIQUE: Multidetector CT imaging of the abdomen and pelvis was performed following the standard protocol without IV contrast. COMPARISON:  CT abdomen pelvis dated 10/29/2004. abdominal ultrasound dated 03/09/2020. FINDINGS: Evaluation of this exam is limited in the absence of intravenous contrast. Lower chest: Extensive confluent pulmonary opacities primarily involving the right lung as well as lingula most consistent with multifocal pneumonia. Clinical correlation and follow-up to resolution recommended. There is mild cardiomegaly. Cardiac pacemaker wires noted. No intra-abdominal free air. Small perihepatic free fluid. Hepatobiliary: Cirrhosis. No intrahepatic biliary dilatation. Layering gallstones. No pericholecystic fluid or evidence of acute cholecystitis by CT. Pancreas: Mild peripancreatic haziness. Correlation with pancreatic enzymes recommended to evaluate for possibility of acute pancreatitis. No fluid collection. Spleen: Normal in size without focal abnormality. Adrenals/Urinary Tract: The adrenal glands unremarkable. There is no hydronephrosis or nephrolithiasis on either side. The visualized ureters appear unremarkable. The urinary bladder is collapsed. Stomach/Bowel: There is diffuse colonic diverticulosis without active inflammatory changes. There is no bowel obstruction or active inflammation. The appendix is normal. There is a 5.1 x 3.6 cm fluid attenuating structure in the mesentery along the inferior aspect of the third portion of the duodenum. There are areas of calcifications along the wall of this structure. This was present on the CT of 2006 but slightly larger on today's exam. 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 Vascular/Lymphatic: There is advanced aortoiliac atherosclerotic disease. The IVC is unremarkable. No portal venous gas. There is no adenopathy. Reproductive: The prostate and seminal vesicles are grossly unremarkable. No pelvic masses. Prominent pelvic vasculature. Other: Bilateral fat containing inguinal hernias. Musculoskeletal: Osteopenia with degenerative changes of the spine. No acute osseous pathology. IMPRESSION: 1. Multifocal pneumonia. Clinical correlation and follow-up to resolution recommended. 2. Cirrhosis with small perihepatic free fluid. 3. Cholelithiasis. 4. Colonic diverticulosis. No bowel obstruction. Normal appendix. 5. Aortic Atherosclerosis (ICD10-I70.0). Electronically Signed   By: Anner Crete M.D.   On: 03/09/2020 18:49   CT Head Wo Contrast  Result Date: 03/09/2020 CLINICAL DATA:  Mental status changes. EXAM: CT HEAD WITHOUT CONTRAST TECHNIQUE: Contiguous axial images were obtained from the base of the skull through the vertex without intravenous contrast. COMPARISON:  None FINDINGS: Brain: No evidence of acute infarction, hemorrhage, hydrocephalus, extra-axial collection or mass lesion/mass effect. There is mild diffuse low-attenuation within the subcortical and periventricular white matter compatible with chronic microvascular disease. Prominence of the sulci and ventricles compatible with brain atrophy. Vascular: No hyperdense vessel or unexpected calcification. Skull: Normal. Negative for fracture or focal lesion. Sinuses/Orbits: No acute finding. Other: None. IMPRESSION: 1. No acute intracranial abnormalities. 2. Chronic small vessel ischemic change and brain atrophy. Electronically Signed   By: Kerby Moors M.D.   On: 03/09/2020 18:47   DG Chest Portable 1 View  Result Date: 03/09/2020 CLINICAL DATA:  Shortness of breath. Nausea and abdominal pain. Hypoxia. EXAM: PORTABLE CHEST 1 VIEW COMPARISON:  Most  recent radiograph 11/11/2018 FINDINGS: Dual lead left-sided pacemaker in place.  Chronic cardiomegaly. Unchanged mediastinal contours. Vascular congestion and suggestion of pulmonary edema. There is also patchy opacity at the right lung base. No pneumothorax. No large pleural effusion. No acute osseous abnormalities are seen. IMPRESSION: 1. Cardiomegaly with vascular congestion and suggestion of pulmonary edema. 2. More focal opacity at the right lung base is suspicious for pneumonia, asymmetric edema or atelectasis felt less likely. Electronically Signed   By: Keith Rake M.D.   On: 03/09/2020 16:09   US Abdomen Limited RUQ (LIVER/GB)  Result Date: 03/09/2020 CLINICAL DATA:  Right upper quadrant pain EXAM: ULTRASOUND ABDOMEN LIMITED RIGHT UPPER QUADRANT COMPARISON:  11/24/2019 FINDINGS: Gallbladder: Sludge and stones within the gallbladder. Slight increased gallbladder wall thickness of 4.3 mm. Positive sonographic Murphy. Common bile duct: Diameter: 3.7 mm Liver: Coarse liver with suspected contour nodularity/cirrhosis. No focal hepatic abnormality. Portal vein is patent on color Doppler imaging with normal direction of blood flow towards the liver. Other: None. IMPRESSION: 1. Sludge and stones in the gallbladder with slight wall thickening and positive sonographic Murphy, concerning for acute cholecystitis. Negative for biliary dilatation 2. Suspected liver cirrhosis. Electronically Signed   By: Donavan Foil M.D.   On: 03/09/2020 17:56        Discharge Exam: Vitals:   03/13/20 2127 03/14/20 0432  BP: 107/80 91/68  Pulse: 82 62  Resp: 20 20  Temp: (!) 97.4 F (36.3 C) (!) 97.3 F (36.3 C)  SpO2: 99% 96%   Vitals:   03/13/20 0517 03/13/20 1300 03/13/20 2127 03/14/20 0432  BP: 97/74 100/64 107/80 91/68  Pulse: (!) 59 (!) 56 82 62  Resp: _0 Temp: 98.1 F (36.7 C) (!) 97.5 F (36.4 C) (!) 97.4 F (36.3 C) (!) 97.3 F (36.3 C)  TempSrc: Oral Oral Oral Oral  SpO2: 96%  100% 99% 96%  Weight:      Height:        General: Pt is alert, awake, not in acute distress Cardiovascular: IRRR, S1/S2 +, no rubs, no gallops Respiratory: bibasilar rales. No wheeze Abdominal: Soft, NT, ND, bowel sounds + Extremities: no edema, no cyanosis   The results of significant diagnostics from this hospitalization (including imaging, microbiology, ancillary and laboratory) are listed below for reference.    Significant Diagnostic Studies: CT ABDOMEN PELVIS WO CONTRAST  Result Date: 03/09/2020 CLINICAL DATA:  74 year old male with epigastric pain. EXAM: CT ABDOMEN AND PELVIS WITHOUT CONTRAST TECHNIQUE: Multidetector CT imaging of the abdomen and pelvis was performed following the standard protocol without IV contrast. COMPARISON:  CT abdomen pelvis dated 10/29/2004. abdominal ultrasound dated 03/09/2020. FINDINGS: Evaluation of this exam is limited in the absence of intravenous contrast. Lower chest: Extensive confluent pulmonary opacities primarily involving the right lung as well as lingula most consistent with multifocal pneumonia. Clinical correlation and follow-up to resolution recommended. There is mild cardiomegaly. Cardiac pacemaker wires noted. No intra-abdominal free air. Small perihepatic free fluid. Hepatobiliary: Cirrhosis. No intrahepatic biliary dilatation. Layering gallstones. No pericholecystic fluid or evidence of acute cholecystitis by CT. Pancreas: Mild peripancreatic haziness. Correlation with pancreatic enzymes recommended to evaluate for possibility of acute pancreatitis. No fluid collection. Spleen: Normal in size without focal abnormality. Adrenals/Urinary Tract: The adrenal glands unremarkable. There is no hydronephrosis or nephrolithiasis on either side. The visualized ureters appear unremarkable. The urinary bladder is collapsed. Stomach/Bowel: There is diffuse colonic diverticulosis without active inflammatory changes. There is no bowel obstruction or active  inflammation. The appendix is normal. There is a 5.1 x 3.6 cm fluid  attenuating structure in the mesentery along the inferior aspect of the third portion of the duodenum. There are areas of calcifications along the wall of this structure. This was present on the CT of 2006 but slightly larger on today's exam. 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 Vascular/Lymphatic: There is advanced aortoiliac atherosclerotic disease. The IVC is unremarkable. No portal venous gas. There is no adenopathy. Reproductive: The prostate and seminal vesicles are grossly unremarkable. No pelvic masses. Prominent pelvic vasculature. Other: Bilateral fat containing inguinal hernias. Musculoskeletal: Osteopenia with degenerative changes of the spine. No acute osseous pathology. IMPRESSION: 1. Multifocal pneumonia. Clinical correlation and follow-up to resolution recommended. 2. Cirrhosis with small perihepatic free fluid. 3. Cholelithiasis. 4. Colonic diverticulosis. No bowel obstruction. Normal appendix. 5. Aortic Atherosclerosis (ICD10-I70.0). Electronically Signed   By: Anner Crete M.D.   On: 03/09/2020 18:49   CT Head Wo Contrast  Result Date: 03/09/2020 CLINICAL DATA:  Mental status changes. EXAM: CT HEAD WITHOUT CONTRAST TECHNIQUE: Contiguous axial images were obtained from the base of the skull through the vertex without intravenous contrast. COMPARISON:  None FINDINGS: Brain: No evidence of acute infarction, hemorrhage, hydrocephalus, extra-axial collection or mass lesion/mass effect. There is mild diffuse low-attenuation within the subcortical and periventricular white matter compatible with chronic microvascular disease. Prominence of the sulci and ventricles compatible with brain atrophy. Vascular: No hyperdense vessel or unexpected calcification. Skull: Normal. Negative for fracture or focal lesion. Sinuses/Orbits: No acute finding. Other: None. IMPRESSION: 1. No acute  intracranial abnormalities. 2. Chronic small vessel ischemic change and brain atrophy. Electronically Signed   By: Kerby Moors M.D.   On: 03/09/2020 18:47   DG Chest Portable 1 View  Result Date: 03/09/2020 CLINICAL DATA:  Shortness of breath. Nausea and abdominal pain. Hypoxia. EXAM: PORTABLE CHEST 1 VIEW COMPARISON:  Most recent radiograph 11/11/2018 FINDINGS: Dual lead left-sided pacemaker in place. Chronic cardiomegaly. Unchanged mediastinal contours. Vascular congestion and suggestion of pulmonary edema. There is also patchy opacity at the right lung base. No pneumothorax. No large pleural effusion. No acute osseous abnormalities are seen. IMPRESSION: 1. Cardiomegaly with vascular congestion and suggestion of pulmonary edema. 2. More focal opacity at the right lung base is suspicious for pneumonia, asymmetric edema or atelectasis felt less likely. Electronically Signed   By: Keith Rake M.D.   On: 03/09/2020 16:09   US Abdomen Limited RUQ (LIVER/GB)  Result Date: 03/09/2020 CLINICAL DATA:  Right upper quadrant pain EXAM: ULTRASOUND ABDOMEN LIMITED RIGHT UPPER QUADRANT COMPARISON:  11/24/2019 FINDINGS: Gallbladder: Sludge and stones within the gallbladder. Slight increased gallbladder wall thickness of 4.3 mm. Positive sonographic Murphy. Common bile duct: Diameter: 3.7 mm Liver: Coarse liver with suspected contour nodularity/cirrhosis. No focal hepatic abnormality. Portal vein is patent on color Doppler imaging with normal direction of blood flow towards the liver. Other: None. IMPRESSION: 1. Sludge and stones in the gallbladder with slight wall thickening and positive sonographic Murphy, concerning for acute cholecystitis. Negative for biliary dilatation 2. Suspected liver cirrhosis. Electronically Signed   By: Donavan Foil M.D.   On: 03/09/2020 17:56     Microbiology: Recent Results (from the past 240 hour(s))  Resp Panel by RT-PCR (Flu A&B, Covid) Nasopharyngeal Swab     Status: None    Collection Time: 03/09/20  3:34 PM   Specimen: Nasopharyngeal Swab; Nasopharyngeal(NP) swabs in vial transport medium  Result Value Ref Range Status   SARS Coronavirus 2 by RT PCR NEGATIVE NEGATIVE Final    Comment: (NOTE) SARS-CoV-2  target nucleic acids are NOT DETECTED.  The SARS-CoV-2 RNA is generally detectable in upper respiratory specimens during the acute phase of infection. The lowest concentration of SARS-CoV-2 viral copies this assay can detect is 138 copies/mL. A negative result does not preclude SARS-Cov-2 infection and should not be used as the sole basis for treatment or other patient management decisions. A negative result may occur with  improper specimen collection/handling, submission of specimen other than nasopharyngeal swab, presence of viral mutation(s) within the areas targeted by this assay, and inadequate number of viral copies(<138 copies/mL). A negative result must be combined with clinical observations, patient history, and epidemiological information. The expected result is Negative.  Fact Sheet for Patients:  EntrepreneurPulse.com.au  Fact Sheet for Healthcare Providers:  IncredibleEmployment.be  This test is no t yet approved or cleared by the [place] FDA and  has been authorized for detection and/or diagnosis of SARS-CoV-2 by FDA under an Emergency Use Authorization (EUA). This EUA will remain  in effect (meaning this test can be used) for the duration of the COVID-19 declaration under Section 564(b)(1) of the Act, 21 U.S.C.section 360bbb-3(b)(1), unless the authorization is terminated  or revoked sooner.       Influenza A by PCR NEGATIVE NEGATIVE Final   Influenza B by PCR NEGATIVE NEGATIVE Final    Comment: (NOTE) The Xpert Xpress SARS-CoV-2/FLU/RSV plus assay is intended as an aid in the diagnosis of influenza from Nasopharyngeal swab specimens and should not be used as a sole basis for treatment.  Nasal washings and aspirates are unacceptable for Xpert Xpress SARS-CoV-2/FLU/RSV testing.  Fact Sheet for Patients: EntrepreneurPulse.com.au  Fact Sheet for Healthcare Providers: IncredibleEmployment.be  This test is not yet approved or cleared by the [place] FDA and has been authorized for detection and/or diagnosis of SARS-CoV-2 by FDA under an Emergency Use Authorization (EUA). This EUA will remain in effect (meaning this test can be used) for the duration of the COVID-19 declaration under Section 564(b)(1) of the Act, 21 U.S.C. section 360bbb-3(b)(1), unless the authorization is terminated or revoked.  Performed at [facility], [address], [place], [place] [address]   Culture, blood (routine x 2)     Status: None   Collection Time: 03/09/20  5:00 PM   Specimen: BLOOD LEFT ARM  Result Value Ref Range Status   Specimen Description BLOOD LEFT ARM  Final   Special Requests   Final    BOTTLES DRAWN AEROBIC AND ANAEROBIC Blood Culture results may not be optimal due to an inadequate volume of blood received in culture bottles   Culture   Final    NO GROWTH 5 DAYS Performed at [facility], [address], [place], [place] [address]    Report Status 03/14/2020 FINAL  Final  Culture, blood (routine x 2)     Status: None   Collection Time: 03/09/20  5:00 PM   Specimen: BLOOD LEFT HAND  Result Value Ref Range Status   Specimen Description BLOOD LEFT HAND  Final   Special Requests   Final    BOTTLES DRAWN AEROBIC AND ANAEROBIC Blood Culture adequate volume   Culture   Final    NO GROWTH 5 DAYS Performed at [facility], [address], [place], [place] [address]    Report Status 03/14/2020 FINAL  Final  MRSA PCR Screening     Status: None   Collection Time: 03/10/20  4:18 PM   Specimen: Nasopharyngeal  Result Value Ref Range Status   MRSA by PCR NEGATIVE  NEGATIVE Final    Comment:        The GeneXpert MRSA Assay (FDA approved for  NASAL specimens only), is one component of a comprehensive MRSA colonization surveillance program. It is not intended to diagnose MRSA infection nor to guide or monitor treatment for MRSA infections. Performed at [facility], [address], [place], [place] [address]      Labs: Basic Metabolic Panel: Recent Labs  Lab 03/10/20 0322 03/11/20 0439 03/12/20 0345 03/13/20 0435 03/14/20 0339  NA 131* 129* 129* 128* 132*  K 3.8 3.5 4.5 3.2* 3.3*  CL 95* 94* 98 95* 98  CO2 24 22 21* 23 22  GLUCOSE 148* 173* 145* 220* 113*  BUN 73* 80* 78* 79* 74*  CREATININE 2.43* 2.62* 2.42* 2.15* 2.07*  CALCIUM 8.4* 8.3* 7.7* 8.1* 8.4*  MG 2.7*  --  2.5*  --  2.7*  PHOS 6.0*  --   --   --   --    Liver Function Tests: Recent Labs  Lab 03/09/20 1544 03/10/20 0322 03/11/20 0439 03/12/20 0345  AST 57* 47* 45* 35  ALT _0 ALKPHOS 167* 122 103 89  BILITOT 4.9* 4.7* 3.8* 2.7*  PROT 7.9 7.0 7.1 6.7  ALBUMIN 3.4* 2.8* 2.8* 2.7*   Recent Labs  Lab 03/09/20 1544  LIPASE 37   Recent Labs  Lab 03/09/20 1845 03/10/20 0749 03/12/20 0544  AMMONIA 64* 56* 47*   CBC: Recent Labs  Lab 03/09/20 1544 03/10/20 0322 03/11/20 0439 03/12/20 0345 03/13/20 0435 03/14/20 0339  WBC 9.5 14.6* 10.8* 9.2 8.0 10.2  NEUTROABS 8.1*  --  9.5*  --   --   --   HGB 13.9 13.1 13.0 12.4* 12.7* 14.1  HCT 41.4 38.6* 38.3* 36.2* 38.1* 41.9  MCV 91.6 93.2 92.1 91.4 92.3 92.5  PLT 101* 80* 80* 81* 89* 124*   Cardiac Enzymes: No results for input(s): CKTOTAL, CKMB, CKMBINDEX, TROPONINI in the last 168 hours. BNP: Invalid input(s): POCBNP CBG: Recent Labs  Lab 03/13/20 0738 03/13/20 1130 03/13/20 1701 03/13/20 2125 03/14/20 0741  GLUCAP 165* 214* 233* 200* 87    Time coordinating discharge:  36 minutes  Signed:  Orson Eva, DO [facility] Pager: [phone] 03/14/2020, 9:02 AM

## 2020-03-13 NOTE — Progress Notes (Signed)
PROGRESS NOTE  John [name]:[id number] DOB: 1945-11-04 DOA: 03/09/2020 PCP: Erven Colla, DO  Brief History: 74 year old male with a history of systolic and diastolic CHF, diabetes mellitus type 2, hypertension, hyperlipidemia,NASHcirrhosis, complete heart block status post permanent pacemaker, persistent atrial fibrillation presenting with 2 to 3-week history of shortness of breath, coughing, congestion, and orthopnea type symptoms. Unfortunately, the patient is a poor historian. His wife assists with the history. She states that he has had intermittent abdominal pain for the past month. She states that occasionally his abdominal pain is relieved by bowel movement. He has not had any vomiting, diarrhea, hematochezia, melena, dysuria, hematuria. He has not had any fevers, chills, chest pain, headache, neck pain. There is been no hemoptysis. She states that he has been compliant with his medications, but she has not able to clarify some of the frequency and dosages of his medications. In the emergency department, the patient was afebrile and hypotensive initially. Oxygen saturation was initially down to 88% on room air. The patient was placed on 4 L nasal cannula. BMP showed a serum creatinine 2.43. AST forty-seven, ALT twenty-five, alk phosphatase 122, total bilirubin 4.7, lipase thirty-seven. WBC 14.6, hemoglobin 13.1, platelets 80,000. CT abdomen and pelvis showed confluent pulmonary passages in the right lower lobe and lingula. There was layering gallstones without biliary duct dilatation. There were signs of liver cirrhosis. There is mild peripancreatic stranding. CT of the brain was negative for any acute findings. Right upper quadrant ultrasound showed sludge and gallstones in the gallbladder with slight gallbladder wall distention. There is no biliary ductal dilatation. Chest x-ray showed right lower lobe opacity with chronic interstitial  changes.  Assessment/Plan: Acute respiratory failure with hypoxia -Secondary to pulmonary edema and pneumonia -Currently stable on 2 L>>RA -Initially 88% on room air -Wean oxygen as tolerated  Lobar pneumonia -Personally reviewed chest x-ray--right lower lobe opacity, increased interstitial markings -Continue Zosyn anddoxy  Symptomatic cholelithiasis -Patient does not appear to be a good candidate for surgical intervention -03/09/2020 right upper quadrant ultrasound--sludge and stones in the gallbladder with gallbladder wall thickening -Continue empiric antibiotics,Zosyn -continue low rate IVF  Acute on chronic systolic and diastolic CHF -continue holding IV lasix--he appears hypovolemic -Holding carvedilol secondary to hypotension and soft blood pressure -Accurate I's and O's -11/05/2019 TEE EF 35-40%, mild decreased RV function, moderate TR/MR, global HK -continue Holding spironolactone temporarily secondary to soft blood pressure  Acute on chronic renal failure--CKD stage IIIb -Baseline creatinine 1.5-1.8 -Secondary to infectious process and hemodynamic changes -serum creatinine peaked 2.62 -torsemide on hold due to volume depletion  hyperammonemia -Secondary to chronic hepatic congestion andNASH -Patient did have an episode of increased somnolence in the emergency department -pt is awake and alert -d/c lactulose and monitor clinically -Repeat ammonia  Diabetes mellitus type 2 -NovoLog sliding scale -Holding [place] and Trulicity -check U6J--3.3  Persistent atrial fibrillation -Rate controlled -Continue apixaban -Holding carvedilol secondary to soft blood pressure  Complete heart block -Status post permanent pacemaker  Hyperlipidemia -Holding statin temporarily  Thrombocytopenia -due to infection and cirrohsis -monitor for signs of bleeding  Hyponatremia -due toCHF, now poor solute intake  Goals of Care -confirmed with  spouse--DNR      Status is: Inpatient  Remains inpatient appropriate because:IV treatments appropriate due to intensity of illness or inability to take PO   Dispo: The patient is from:Home Anticipated d/c is LK:TGYB Anticipated d/c date is: 2 days Patient currently is not medically stable  to d/c.        Family Communication:Spouse updated 12/19  Consultants:General surgery  Code Status: DNR  DVT Prophylaxis:apixaban   Procedures: As Listed in Progress Note Above  Antibiotics: Zosyn 12/16>> Doxy 12/16>>      Subjective: Patient denies fevers, chills, headache, chest pain, dyspnea, nausea, vomiting, diarrhea, abdominal pain, dysuria, hematuria, hematochezia, and melena.   Objective: Vitals:   03/12/20 1638 03/12/20 2140 03/13/20 0517 03/13/20 1300  BP: 94/65 108/81 97/74 100/64  Pulse: 67 63 (!) 59 (!) 56  Resp: 20 18 18 20   Temp: 98.1 F (36.7 C) 98 F (36.7 C) 98.1 F (36.7 C) (!) 97.5 F (36.4 C)  TempSrc: Oral Oral Oral Oral  SpO2: 100% 100% 96% 100%  Weight:      Height:        Intake/Output Summary (Last 24 hours) at 03/13/2020 1643 Last data filed at 03/13/2020 0641 Gross per 24 hour  Intake --  Output 600 ml  Net -600 ml   Weight change:  Exam:   General:  Pt is alert, follows commands appropriately, not in acute distress  HEENT: No icterus, No thrush, No neck mass, [place]/AT  Cardiovascular: RRR, S1/S2, no rubs, no gallops  Respiratory: bibasilar rales. No wheeze  Abdomen: Soft/+BS, non tender, non distended, no guarding  Extremities: No edema, No lymphangitis, No petechiae, No rashes, no synovitis   Data Reviewed: I have personally reviewed following labs and imaging studies Basic Metabolic Panel: Recent Labs  Lab 03/09/20 1544 03/10/20 0322 03/11/20 0439 03/12/20 0345 03/13/20 0435  NA 129* 131* 129* 129* 128*  K 3.6 3.8 3.5 4.5 3.2*  CL 91*  95* 94* 98 95*  CO2 25 24 22  21* 23  GLUCOSE 92 148* 173* 145* 220*  BUN 70* 73* 80* 78* 79*  CREATININE 2.38* 2.43* 2.62* 2.42* 2.15*  CALCIUM 8.9 8.4* 8.3* 7.7* 8.1*  MG  --  2.7*  --  2.5*  --   PHOS  --  6.0*  --   --   --    Liver Function Tests: Recent Labs  Lab 03/09/20 1544 03/10/20 0322 03/11/20 0439 03/12/20 0345  AST 57* 47* 45* 35  ALT 25 25 25 20   ALKPHOS 167* 122 103 89  BILITOT 4.9* 4.7* 3.8* 2.7*  PROT 7.9 7.0 7.1 6.7  ALBUMIN 3.4* 2.8* 2.8* 2.7*   Recent Labs  Lab 03/09/20 1544  LIPASE 37   Recent Labs  Lab 03/09/20 1845 03/10/20 0749 03/12/20 0544  AMMONIA 64* 56* 47*   Coagulation Profile: Recent Labs  Lab 03/10/20 0322  INR 2.5*   CBC: Recent Labs  Lab 03/09/20 1544 03/10/20 0322 03/11/20 0439 03/12/20 0345 03/13/20 0435  WBC 9.5 14.6* 10.8* 9.2 8.0  NEUTROABS 8.1*  --  9.5*  --   --   HGB 13.9 13.1 13.0 12.4* 12.7*  HCT 41.4 38.6* 38.3* 36.2* 38.1*  MCV 91.6 93.2 92.1 91.4 92.3  PLT 101* 80* 80* 81* 89*   Cardiac Enzymes: No results for input(s): CKTOTAL, CKMB, CKMBINDEX, TROPONINI in the last 168 hours. BNP: Invalid input(s): POCBNP CBG: Recent Labs  Lab 03/12/20 1115 03/12/20 1646 03/12/20 2156 03/13/20 0738 03/13/20 1130  GLUCAP 185* 216* 256* 165* 214*   HbA1C: No results for input(s): HGBA1C in the last 72 hours. Urine analysis:    Component Value Date/Time   COLORURINE YELLOW 08/19/2018 1120   APPEARANCEUR CLEAR 08/19/2018 1120   LABSPEC 1.014 08/19/2018 1120   PHURINE 5.0 08/19/2018 1120  GLUCOSEU NEGATIVE 08/19/2018 1120   HGBUR NEGATIVE 08/19/2018 1120   [place] 08/19/2018 1120   [place] 08/19/2018 1120   PROTEINUR NEGATIVE 08/19/2018 1120   UROBILINOGEN 0.2 01/05/2014 0744   NITRITE NEGATIVE 08/19/2018 1120   LEUKOCYTESUR NEGATIVE 08/19/2018 1120   Sepsis Labs: @LABRCNTIP (procalcitonin:4,lacticidven:4) ) Recent Results (from the past 240 hour(s))  Resp Panel by RT-PCR  (Flu A&B, Covid) Nasopharyngeal Swab     Status: None   Collection Time: 03/09/20  3:34 PM   Specimen: Nasopharyngeal Swab; Nasopharyngeal(NP) swabs in vial transport medium  Result Value Ref Range Status   SARS Coronavirus 2 by RT PCR NEGATIVE NEGATIVE Final    Comment: (NOTE) SARS-CoV-2 target nucleic acids are NOT DETECTED.  The SARS-CoV-2 RNA is generally detectable in upper respiratory specimens during the acute phase of infection. The lowest concentration of SARS-CoV-2 viral copies this assay can detect is 138 copies/mL. A negative result does not preclude SARS-Cov-2 infection and should not be used as the sole basis for treatment or other patient management decisions. A negative result may occur with  improper specimen collection/handling, submission of specimen other than nasopharyngeal swab, presence of viral mutation(s) within the areas targeted by this assay, and inadequate number of viral copies(<138 copies/mL). A negative result must be combined with clinical observations, patient history, and epidemiological information. The expected result is Negative.  Fact Sheet for Patients:  EntrepreneurPulse.com.au  Fact Sheet for Healthcare Providers:  IncredibleEmployment.be  This test is no t yet approved or cleared by the [place] FDA and  has been authorized for detection and/or diagnosis of SARS-CoV-2 by FDA under an Emergency Use Authorization (EUA). This EUA will remain  in effect (meaning this test can be used) for the duration of the COVID-19 declaration under Section 564(b)(1) of the Act, 21 U.S.C.section 360bbb-3(b)(1), unless the authorization is terminated  or revoked sooner.       Influenza A by PCR NEGATIVE NEGATIVE Final   Influenza B by PCR NEGATIVE NEGATIVE Final    Comment: (NOTE) The Xpert Xpress SARS-CoV-2/FLU/RSV plus assay is intended as an aid in the diagnosis of influenza from Nasopharyngeal swab specimens  and should not be used as a sole basis for treatment. Nasal washings and aspirates are unacceptable for Xpert Xpress SARS-CoV-2/FLU/RSV testing.  Fact Sheet for Patients: EntrepreneurPulse.com.au  Fact Sheet for Healthcare Providers: IncredibleEmployment.be  This test is not yet approved or cleared by the [place] FDA and has been authorized for detection and/or diagnosis of SARS-CoV-2 by FDA under an Emergency Use Authorization (EUA). This EUA will remain in effect (meaning this test can be used) for the duration of the COVID-19 declaration under Section 564(b)(1) of the Act, 21 U.S.C. section 360bbb-3(b)(1), unless the authorization is terminated or revoked.  Performed at [facility], [address] [place], [place] [address]   Culture, blood (routine x 2)     Status: None (Preliminary result)   Collection Time: 03/09/20  5:00 PM   Specimen: BLOOD LEFT ARM  Result Value Ref Range Status   Specimen Description BLOOD LEFT ARM  Final   Special Requests   Final    BOTTLES DRAWN AEROBIC AND ANAEROBIC Blood Culture results may not be optimal due to an inadequate volume of blood received in culture bottles   Culture   Final    NO GROWTH 3 DAYS Performed at [facility], [address], [place], [place] [address]    Report Status PENDING  Incomplete  Culture, blood (routine x 2)  Status: None (Preliminary result)   Collection Time: 03/09/20  5:00 PM   Specimen: BLOOD LEFT HAND  Result Value Ref Range Status   Specimen Description BLOOD LEFT HAND  Final   Special Requests   Final    BOTTLES DRAWN AEROBIC AND ANAEROBIC Blood Culture adequate volume   Culture   Final    NO GROWTH 3 DAYS Performed at [facility], [address], [place], [place] [address]    Report Status PENDING  Incomplete  MRSA PCR Screening     Status: None   Collection Time: 03/10/20  4:18 PM   Specimen: Nasopharyngeal  Result Value Ref Range Status   MRSA by PCR  NEGATIVE NEGATIVE Final    Comment:        The GeneXpert MRSA Assay (FDA approved for NASAL specimens only), is one component of a comprehensive MRSA colonization surveillance program. It is not intended to diagnose MRSA infection nor to guide or monitor treatment for MRSA infections. Performed at [facility], [address] [place], [place] [address]      Scheduled Meds: . apixaban  5 mg Oral BID  . calamine   Topical BID  . feeding supplement (GLUCERNA SHAKE)  237 mL Oral TID BM  . insulin aspart  0-5 Units Subcutaneous QHS  . insulin aspart  0-9 Units Subcutaneous TID WC  . lactulose  20 g Oral Daily  . [START ON 03/14/2020] predniSONE  50 mg Oral Q breakfast   Continuous Infusions: . piperacillin-tazobactam (ZOSYN)  IV 3.375 g (03/13/20 1611)    Procedures/Studies: CT ABDOMEN PELVIS WO CONTRAST  Result Date: 03/09/2020 CLINICAL DATA:  74 year old male with epigastric pain. EXAM: CT ABDOMEN AND PELVIS WITHOUT CONTRAST TECHNIQUE: Multidetector CT imaging of the abdomen and pelvis was performed following the standard protocol without IV contrast. COMPARISON:  CT abdomen pelvis dated 10/29/2004. abdominal ultrasound dated 03/09/2020. FINDINGS: Evaluation of this exam is limited in the absence of intravenous contrast. Lower chest: Extensive confluent pulmonary opacities primarily involving the right lung as well as lingula most consistent with multifocal pneumonia. Clinical correlation and follow-up to resolution recommended. There is mild cardiomegaly. Cardiac pacemaker wires noted. No intra-abdominal free air. Small perihepatic free fluid. Hepatobiliary: Cirrhosis. No intrahepatic biliary dilatation. Layering gallstones. No pericholecystic fluid or evidence of acute cholecystitis by CT. Pancreas: Mild peripancreatic haziness. Correlation with pancreatic enzymes recommended to evaluate for possibility of acute pancreatitis. No fluid collection. Spleen: Normal in size without focal  abnormality. Adrenals/Urinary Tract: The adrenal glands unremarkable. There is no hydronephrosis or nephrolithiasis on either side. The visualized ureters appear unremarkable. The urinary bladder is collapsed. Stomach/Bowel: There is diffuse colonic diverticulosis without active inflammatory changes. There is no bowel obstruction or active inflammation. The appendix is normal. There is a 5.1 x 3.6 cm fluid attenuating structure in the mesentery along the inferior aspect of the third portion of the duodenum. There are areas of calcifications along the wall of this structure. This was present on the CT of 2006 but slightly larger on today's exam. 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 Vascular/Lymphatic: There is advanced aortoiliac atherosclerotic disease. The IVC is unremarkable. No portal venous gas. There is no adenopathy. Reproductive: The prostate and seminal vesicles are grossly unremarkable. No pelvic masses. Prominent pelvic vasculature. Other: Bilateral fat containing inguinal hernias. Musculoskeletal: Osteopenia with degenerative changes of the spine. No acute osseous pathology. IMPRESSION: 1. Multifocal pneumonia. Clinical correlation and follow-up to resolution recommended. 2. Cirrhosis with small perihepatic free fluid.  3. Cholelithiasis. 4. Colonic diverticulosis. No bowel obstruction. Normal appendix. 5. Aortic Atherosclerosis (ICD10-I70.0). Electronically Signed   By: Anner Crete M.D.   On: 03/09/2020 18:49   CT Head Wo Contrast  Result Date: 03/09/2020 CLINICAL DATA:  Mental status changes. EXAM: CT HEAD WITHOUT CONTRAST TECHNIQUE: Contiguous axial images were obtained from the base of the skull through the vertex without intravenous contrast. COMPARISON:  None FINDINGS: Brain: No evidence of acute infarction, hemorrhage, hydrocephalus, extra-axial collection or mass lesion/mass effect. There is mild diffuse low-attenuation within the  subcortical and periventricular white matter compatible with chronic microvascular disease. Prominence of the sulci and ventricles compatible with brain atrophy. Vascular: No hyperdense vessel or unexpected calcification. Skull: Normal. Negative for fracture or focal lesion. Sinuses/Orbits: No acute finding. Other: None. IMPRESSION: 1. No acute intracranial abnormalities. 2. Chronic small vessel ischemic change and brain atrophy. Electronically Signed   By: Kerby Moors M.D.   On: 03/09/2020 18:47   DG Chest Portable 1 View  Result Date: 03/09/2020 CLINICAL DATA:  Shortness of breath. Nausea and abdominal pain. Hypoxia. EXAM: PORTABLE CHEST 1 VIEW COMPARISON:  Most recent radiograph 11/11/2018 FINDINGS: Dual lead left-sided pacemaker in place. Chronic cardiomegaly. Unchanged mediastinal contours. Vascular congestion and suggestion of pulmonary edema. There is also patchy opacity at the right lung base. No pneumothorax. No large pleural effusion. No acute osseous abnormalities are seen. IMPRESSION: 1. Cardiomegaly with vascular congestion and suggestion of pulmonary edema. 2. More focal opacity at the right lung base is suspicious for pneumonia, asymmetric edema or atelectasis felt less likely. Electronically Signed   By: Keith Rake M.D.   On: 03/09/2020 16:09   US Abdomen Limited RUQ (LIVER/GB)  Result Date: 03/09/2020 CLINICAL DATA:  Right upper quadrant pain EXAM: ULTRASOUND ABDOMEN LIMITED RIGHT UPPER QUADRANT COMPARISON:  11/24/2019 FINDINGS: Gallbladder: Sludge and stones within the gallbladder. Slight increased gallbladder wall thickness of 4.3 mm. Positive sonographic Murphy. Common bile duct: Diameter: 3.7 mm Liver: Coarse liver with suspected contour nodularity/cirrhosis. No focal hepatic abnormality. Portal vein is patent on color Doppler imaging with normal direction of blood flow towards the liver. Other: None. IMPRESSION: 1. Sludge and stones in the gallbladder with slight wall  thickening and positive sonographic Murphy, concerning for acute cholecystitis. Negative for biliary dilatation 2. Suspected liver cirrhosis. Electronically Signed   By: Donavan Foil M.D.   On: 03/09/2020 17:56    Orson Eva, DO  [facility]  If 7PM-7AM, please contact night-coverage [email or web address] Password [email or web address] 03/13/2020, 4:43 PM   LOS: 4 days

## 2020-03-14 ENCOUNTER — Ambulatory Visit (HOSPITAL_COMMUNITY): Payer: Medicare HMO

## 2020-03-14 DIAGNOSIS — I4821 Permanent atrial fibrillation: Secondary | ICD-10-CM

## 2020-03-14 LAB — CBC
HCT: 41.9 % (ref 39.0–52.0)
Hemoglobin: 14.1 g/dL (ref 13.0–17.0)
MCH: 31.1 pg (ref 26.0–34.0)
MCHC: 33.7 g/dL (ref 30.0–36.0)
MCV: 92.5 fL (ref 80.0–100.0)
Platelets: 124 10*3/uL — ABNORMAL LOW (ref 150–400)
RBC: 4.53 MIL/uL (ref 4.22–5.81)
RDW: 17.7 % — ABNORMAL HIGH (ref 11.5–15.5)
WBC: 10.2 10*3/uL (ref 4.0–10.5)
nRBC: 0.2 % (ref 0.0–0.2)

## 2020-03-14 LAB — BASIC METABOLIC PANEL
Anion gap: 12 (ref 5–15)
BUN: 74 mg/dL — ABNORMAL HIGH (ref 8–23)
CO2: 22 mmol/L (ref 22–32)
Calcium: 8.4 mg/dL — ABNORMAL LOW (ref 8.9–10.3)
Chloride: 98 mmol/L (ref 98–111)
Creatinine, Ser: 2.07 mg/dL — ABNORMAL HIGH (ref 0.61–1.24)
GFR, Estimated: 33 mL/min — ABNORMAL LOW (ref >60)
Glucose, Bld: 113 mg/dL — ABNORMAL HIGH (ref 70–99)
Potassium: 3.3 mmol/L — ABNORMAL LOW (ref 3.5–5.1)
Sodium: 132 mmol/L — ABNORMAL LOW (ref 135–145)

## 2020-03-14 LAB — CULTURE, BLOOD (ROUTINE X 2)
Culture: NO GROWTH
Culture: NO GROWTH
Special Requests: ADEQUATE

## 2020-03-14 LAB — MAGNESIUM: Magnesium: 2.7 mg/dL — ABNORMAL HIGH (ref 1.7–2.4)

## 2020-03-14 LAB — GLUCOSE, CAPILLARY: Glucose-Capillary: 87 mg/dL (ref 70–99)

## 2020-03-14 MED ORDER — DOXYCYCLINE HYCLATE 100 MG PO TABS
100.0000 mg | ORAL_TABLET | Freq: Two times a day (BID) | ORAL | Status: DC
  Administered 2020-03-14: 10:00:00 100 mg via ORAL
  Filled 2020-03-14: qty 1

## 2020-03-14 MED ORDER — POTASSIUM CHLORIDE CRYS ER 20 MEQ PO TBCR: 30.0000 meq | EXTENDED_RELEASE_TABLET | Freq: Every day | ORAL | 6 refills | Status: DC

## 2020-03-14 MED ORDER — DOXYCYCLINE HYCLATE 100 MG PO TABS: 100.0000 mg | ORAL_TABLET | Freq: Two times a day (BID) | ORAL | 0 refills | Status: DC

## 2020-03-14 MED ORDER — AMOXICILLIN-POT CLAVULANATE 875-125 MG PO TABS: 1.0000 | ORAL_TABLET | Freq: Two times a day (BID) | ORAL | 0 refills | Status: DC

## 2020-03-14 MED ORDER — PREDNISONE 10 MG PO TABS: 50.0000 mg | ORAL_TABLET | Freq: Every day | ORAL | 0 refills | Status: DC

## 2020-03-14 MED ORDER — TORSEMIDE 20 MG PO TABS: 40.0000 mg | ORAL_TABLET | Freq: Every day | ORAL | 1 refills | Status: DC

## 2020-03-14 MED ORDER — POTASSIUM CHLORIDE CRYS ER 20 MEQ PO TBCR
20.0000 meq | EXTENDED_RELEASE_TABLET | Freq: Once | ORAL | Status: AC
  Administered 2020-03-14: 10:00:00 20 meq via ORAL
  Filled 2020-03-14: qty 1

## 2020-03-14 MED ORDER — AMOXICILLIN-POT CLAVULANATE 875-125 MG PO TABS
1.0000 | ORAL_TABLET | Freq: Two times a day (BID) | ORAL | Status: DC
  Administered 2020-03-14: 10:00:00 1 via ORAL
  Filled 2020-03-14: qty 1

## 2020-03-14 MED ORDER — TRESIBA FLEXTOUCH 200 UNIT/ML ~~LOC~~ SOPN: 10.0000 [IU] | PEN_INJECTOR | Freq: Every day | SUBCUTANEOUS | 1 refills | Status: DC

## 2020-03-14 MED ORDER — CARVEDILOL 3.125 MG PO TABS: 3.1250 mg | ORAL_TABLET | Freq: Two times a day (BID) | ORAL | 1 refills | Status: DC

## 2020-03-14 NOTE — Progress Notes (Signed)
Nsg Discharge Note  Admit Date:  03/09/2020 Discharge date: 03/14/2020   John [name] to be D/C'd Home per MD order.  AVS completed. Patient/caregiver able to verbalize understanding.  Discharge Medication: Allergies as of 03/14/2020   No Known Allergies     Medication List    STOP taking these medications   pravastatin 20 MG tablet Commonly known as: PRAVACHOL   spironolactone 25 MG tablet Commonly known as: ALDACTONE     TAKE these medications   Accu-Chek Aviva Plus test strip Generic drug: glucose blood TEST BLOOD SUGAR TWICE DAILY BEFORE BREAKFAST AND AT BEDTIME   acetaminophen 500 MG tablet Commonly known as: TYLENOL Take 500 mg by mouth every 6 (six) hours as needed.   amoxicillin-clavulanate 875-125 MG tablet Commonly known as: AUGMENTIN Take 1 tablet by mouth every 12 (twelve) hours.   apixaban 5 MG Tabs tablet Commonly known as: Eliquis Take 1 tablet (5 mg total) by mouth 2 (two) times daily.   BD Pen Needle Nano U/F 32G X 4 MM Misc Generic drug: Insulin Pen Needle 1 each by Does not apply route 4 (four) times daily.   benzonatate 100 MG capsule Commonly known as: TESSALON Take 1 capsule (100 mg total) by mouth 3 (three) times daily as needed for cough.   carvedilol 3.125 MG tablet Commonly known as: Coreg Take 1 tablet (3.125 mg total) by mouth 2 (two) times daily with a meal. What changed:   medication strength  how much to take   doxycycline 100 MG tablet Commonly known as: VIBRA-TABS Take 1 tablet (100 mg total) by mouth every 12 (twelve) hours.   HYDROcodone-acetaminophen 5-325 MG tablet Commonly known as: NORCO/VICODIN Take one tablet po every 4 hrs prn pain What changed:   how much to take  how to take this  when to take this  reasons to take this  additional instructions   hydrocortisone 2.5 % cream Apply BID to affected area What changed:   how much to take  how to take this  when to take this  reasons to take  this  additional instructions   multivitamin with minerals Tabs tablet Take 1 tablet by mouth daily. One A Day for Men   potassium chloride SA 20 MEQ tablet Commonly known as: KLOR-CON Take 1.5 tablets (30 mEq total) by mouth daily. Take 60 meq in the am, at lunch, and in the evening What changed:   how much to take  how to take this  when to take this   predniSONE 10 MG tablet Commonly known as: DELTASONE Take 5 tablets (50 mg total) by mouth daily with breakfast. And decrease by one tablet daily Start taking on: March 15, 2020   sildenafil 50 MG tablet Commonly known as: Viagra Take 1 tablet (50 mg total) by mouth daily as needed for erectile dysfunction.   torsemide 20 MG tablet Commonly known as: DEMADEX Take 2 tablets (40 mg total) by mouth daily. TAKE 2 TABLETS (40 MG TOTAL) BY MOUTH 2 (TWO) TIMES DAILY. What changed:   how much to take  how to take this  when to take this   Tresiba FlexTouch 200 UNIT/ML FlexTouch Pen Generic drug: insulin degludec Inject 10 Units into the skin at bedtime. What changed: how much to take   Trulicity 1.5 SE/8.3TD Sopn Generic drug: Dulaglutide INJECT 1.5MG (1 PEN) SUBCUTANEOUSLY EVERY WEEK What changed: See the new instructions.   Zinc 50 MG Tabs Take 50 mg by mouth daily.  Discharge Assessment: Vitals:   03/13/20 2127 03/14/20 0432  BP: 107/80 91/68  Pulse: 82 62  Resp: 20 20  Temp: (!) 97.4 F (36.3 C) (!) 97.3 F (36.3 C)  SpO2: 99% 96%   Skin clean, dry and intact without evidence of skin break down, no evidence of skin tears noted. IV catheter discontinued intact. Site without signs and symptoms of complications - no redness or edema noted at insertion site, patient denies c/o pain - only slight tenderness at site.  Dressing with slight pressure applied.  D/c Instructions-Education: Discharge instructions given to patient/family with verbalized understanding. D/c education completed with  patient/family including follow up instructions, medication list, d/c activities limitations if indicated, with other d/c instructions as indicated by MD - patient able to verbalize understanding, all questions fully answered. Patient instructed to return to ED, call 911, or call MD for any changes in condition.  Patient escorted via [place], and D/C home via private auto.  [name] Loletha Grayer, RN 03/14/2020 9:51 AM

## 2020-03-14 NOTE — TOC Transition Note (Signed)
Transition of Care [facility]) - CM/SW Discharge Note   Patient Details  Name: John [name] MRN: [id number] Date of Birth: [birth year]  Transition of Care [facility]) CM/SW Contact:  Natasha Bence, LCSW Phone Number: 03/14/2020, 3:34 PM   Clinical Narrative:    CSW spoke with patient's wife to identify if patient agreeable to [facility]. Patient agreeable to [facility] services. CSW placed referral with Georgina Snell from [place]. Georgina Snell agreeable to provide services TOC signing off.   Final next level of care: [place] Barriers to Discharge: Continued Medical Work up   Patient Goals and CMS Choice Patient states their goals for this hospitalization and ongoing recovery are:: Home with [facility] CMS Medicare.gov Compare Post Acute Care list provided to:: Patient Choice offered to / list presented to : [place]  Discharge Placement                  Name of family member notified: [name] Patient and family notified of of transfer: 03/14/20  Discharge Plan and Services                DME Arranged: N/A DME Agency: NA       HH Arranged: PT [place] Agency: [place] Date [facility] Agency Contacted: 03/14/20 Time [place]: 5364 Representative spoke with at [place]: [place] ([place]) Interventions     Readmission Risk Interventions Readmission Risk Prevention Plan 11/14/2018  Transportation Screening Complete  PCP or Specialist Appt within 3-5 Days Complete  HRI or [place] Complete  Social Work Consult for [place] Planning/Counseling Complete  Palliative Care Screening Not Applicable  Medication Review Press photographer) Complete  Some recent data might be hidden

## 2020-03-14 NOTE — Evaluation (Signed)
Physical Therapy Evaluation Patient Details Name: John [name] MRN: [id number] DOB: 31-May-1945 Today's Date: 03/14/2020   History of Present Illness  John [name] is a 74 y.o. male with medical history significant for John [name] is a 74 y.o. male with PMH afib on Eliquis, CHF, DM on insulin, HTN, HLD, NASH who presents to the emergency department due to abdominal pain.  Most of the history was obtained from ED physician and wife at bedside.  Per wife, patient has been having increasing shortness of breath especially at night when he lies down since last 3 weeks, patient also have been having cough with occasional sputum production and chest congestion since last 2 weeks.  Patient complained of generally not feeling well, EMS was activated and on arrival of EMS, patient was noted to be hypoxic with O2 sat at 88% on room air, supplemental oxygen via  at 4 LPM was given with improvement in O2 sat sats (patient does not use oxygen at baseline).  There was no report of fever, chills, chest pain, headache or vomiting.    Clinical Impression  Patient functioning near baseline for functional mobility and gait, has to lean on nearby objects for support when ambulation without AD or using SPC, required use of RW for safety with good return demonstrated for use.  Patient tolerated sitting up at bedside after therapy with his spouse present in room.  Plan:  Patient discharged from physical therapy to care of nursing for ambulation daily as tolerated for length of stay.     Follow Up Recommendations Home health PT;Supervision - Intermittent;Supervision for mobility/OOB    Equipment Recommendations  Rolling walker with 5" wheels    Recommendations for Other Services       Precautions / Restrictions Precautions Precautions: Fall Restrictions Weight Bearing Restrictions: No      Mobility  Bed Mobility Overal bed mobility: Modified Independent             General bed mobility  comments: increased time    Transfers Overall transfer level: Modified independent Equipment used: 1 person hand held assist;Rolling walker (2 wheeled);Straight cane             General transfer comment: slightly unsteady, has to lean on nearby objects for support  Ambulation/Gait Ambulation/Gait assistance: Supervision Gait Distance (Feet): 80 Feet Assistive device: Rolling walker (2 wheeled);Straight cane;None Gait Pattern/deviations: Decreased step length - right;Decreased step length - left;Decreased stride length Gait velocity: decreased   General Gait Details: labored cadence with frequent leaning on nearby objects for support when not using an AD or using SPC, more stable using RW with good return demonstrated without loss of balance  Stairs            Wheelchair Mobility    Modified Rankin (Stroke Patients Only)       Balance Overall balance assessment: Needs assistance Sitting-balance support: Feet supported;No upper extremity supported Sitting balance-Leahy Scale: Good Sitting balance - Comments: seated at EOB   Standing balance support: During functional activity;No upper extremity supported Standing balance-Leahy Scale: Poor Standing balance comment: fair/poor without AD or using SPC, fair/good using RW                             Pertinent Vitals/Pain Pain Assessment: No/denies pain    Home Living Family/patient expects to be discharged to:: Private residence Living Arrangements: Spouse/significant other Available Help at Discharge: Family;Available 24 hours/day Type of Home: [place]  Access: Level entry     Home Layout: One level;Able to live on main level with bedroom/bathroom;Two level;Laundry or work area in [place]: Kasandra Knudsen - single point;Shower seat      Prior Function Level of Independence: Independent         Comments: Hydrographic surveyor, drives     Journalist, newspaper        Extremity/Trunk  Assessment   Upper Extremity Assessment Upper Extremity Assessment: Overall WFL for tasks assessed    Lower Extremity Assessment Lower Extremity Assessment: Generalized weakness    Cervical / Trunk Assessment Cervical / Trunk Assessment: Normal  Communication   Communication: No difficulties  Cognition Arousal/Alertness: Awake/alert Behavior During Therapy: WFL for tasks assessed/performed Overall Cognitive Status: Within Functional Limits for tasks assessed                                        General Comments      Exercises     Assessment/Plan    PT Assessment All further PT needs can be met in the next venue of care  PT Problem List Decreased strength;Decreased activity tolerance;Decreased balance;Decreased mobility       PT Treatment Interventions      PT Goals (Current goals can be found in the Care Plan section)  Acute Rehab PT Goals Patient Stated Goal: return PT Goal Formulation: With patient/family Time For Goal Achievement: 03/14/20 Potential to Achieve Goals: Good    Frequency     Barriers to discharge        Co-evaluation               AM-PAC PT "6 Clicks" Mobility  Outcome Measure Help needed turning from your back to your side while in a flat bed without using bedrails?: None Help needed moving from lying on your back to sitting on the side of a flat bed without using bedrails?: None Help needed moving to and from a bed to a chair (including a wheelchair)?: A Little Help needed standing up from a chair using your arms (e.g., wheelchair or bedside chair)?: A Little Help needed to walk in hospital room?: A Little Help needed climbing 3-5 steps with a railing? : A Little 6 Click Score: 20    End of Session   Activity Tolerance: Patient tolerated treatment well;Patient limited by fatigue Patient left: in bed;with call bell/phone within reach;with family/visitor present Nurse Communication: Mobility status PT Visit  Diagnosis: Unsteadiness on feet (R26.81);Other abnormalities of gait and mobility (R26.89);Muscle weakness (generalized) (M62.81)    Time: 6578-4696 PT Time Calculation (min) (ACUTE ONLY): 25 min   Charges:   PT Evaluation $PT Eval Moderate Complexity: 1 Mod PT Treatments $Therapeutic Activity: 23-37 mins        2:00 PM, 03/14/20 Lonell Grandchild, MPT Physical Therapist with [facility] [phone] office [phone] mobile phone

## 2020-03-14 NOTE — Plan of Care (Signed)

## 2020-03-17 ENCOUNTER — Other Ambulatory Visit: Payer: Self-pay

## 2020-03-17 NOTE — Patient Outreach (Signed)
John [name] [facility]) Care Management  03/17/2020  [name] 18-Aug-1945 [id number]    EMMI-General Discharge RED ON EMMI ALERT Day # 1 Date: 03/16/2020 Red Alert Reason: " Got discharge papers? I don't know who to call about changes in condition? No"   Outreach attempt # 1 to patient. Spoke with patient who denies any acute issues or concerns at present. He is HOH and requested call be completed with spouse. Patient did not understand/hear automated call questions correctly. Spouse confirms that they have discharge paperwork in the home and she has reviewed it. She is aware to seek medical help for any worsening and/or unresolved sxs and has MD contact info. Patient has follow up appt on 03/30/20. Spouse voices that they have not heard from [facility] services yet. Advised that it may take 2-3 business days. Spouse has number on discharge paperwork and advised to call agency if she does not hear form them by today.  Advised spouse that they would get one more automated EMMI-GENERAL post discharge calls to assess how they are doing following recent hospitalization and will receive a call from a nurse if any of their responses were abnormal. she voiced understanding and was appreciative of f/u call.     Plan: RN CM will close case at this time.   Enzo Montgomery, RN,BSN,CCM [place] Management Telephonic Care Management Coordinator Direct Phone: [phone] Toll Free: [phone] Fax: [phone]

## 2020-03-20 ENCOUNTER — Emergency Department (HOSPITAL_COMMUNITY): Payer: Medicare HMO

## 2020-03-20 ENCOUNTER — Other Ambulatory Visit: Payer: Self-pay

## 2020-03-20 ENCOUNTER — Emergency Department (HOSPITAL_COMMUNITY)
Admission: EM | Admit: 2020-03-20 | Discharge: 2020-03-20 | Disposition: A | Discharge status: Home / Self Care | Payer: Medicare HMO | Attending: Emergency Medicine | Admitting: Emergency Medicine

## 2020-03-20 ENCOUNTER — Encounter (HOSPITAL_COMMUNITY): Payer: Self-pay

## 2020-03-20 DIAGNOSIS — R600 Localized edema: Secondary | ICD-10-CM

## 2020-03-20 DIAGNOSIS — N1832 Chronic kidney disease, stage 3b: Secondary | ICD-10-CM | POA: Insufficient documentation

## 2020-03-20 DIAGNOSIS — Z794 Long term (current) use of insulin: Secondary | ICD-10-CM | POA: Diagnosis not present

## 2020-03-20 DIAGNOSIS — Z79899 Other long term (current) drug therapy: Secondary | ICD-10-CM | POA: Insufficient documentation

## 2020-03-20 DIAGNOSIS — I130 Hypertensive heart and chronic kidney disease with heart failure and stage 1 through stage 4 chronic kidney disease, or unspecified chronic kidney disease: Secondary | ICD-10-CM | POA: Diagnosis not present

## 2020-03-20 DIAGNOSIS — I110 Hypertensive heart disease with heart failure: Secondary | ICD-10-CM | POA: Diagnosis not present

## 2020-03-20 DIAGNOSIS — I509 Heart failure, unspecified: Secondary | ICD-10-CM | POA: Diagnosis not present

## 2020-03-20 DIAGNOSIS — Z20822 Contact with and (suspected) exposure to covid-19: Secondary | ICD-10-CM | POA: Insufficient documentation

## 2020-03-20 DIAGNOSIS — I5023 Acute on chronic systolic (congestive) heart failure: Secondary | ICD-10-CM | POA: Insufficient documentation

## 2020-03-20 DIAGNOSIS — R0602 Shortness of breath: Secondary | ICD-10-CM | POA: Diagnosis not present

## 2020-03-20 DIAGNOSIS — Z87891 Personal history of nicotine dependence: Secondary | ICD-10-CM | POA: Diagnosis not present

## 2020-03-20 DIAGNOSIS — E1122 Type 2 diabetes mellitus with diabetic chronic kidney disease: Secondary | ICD-10-CM | POA: Diagnosis not present

## 2020-03-20 DIAGNOSIS — M7989 Other specified soft tissue disorders: Secondary | ICD-10-CM | POA: Diagnosis present

## 2020-03-20 LAB — RESP PANEL BY RT-PCR (FLU A&B, COVID) ARPGX2
Influenza A by PCR: NEGATIVE
Influenza B by PCR: NEGATIVE
SARS Coronavirus 2 by RT PCR: NEGATIVE

## 2020-03-20 LAB — BASIC METABOLIC PANEL
Anion gap: 8 (ref 5–15)
BUN: 46 mg/dL — ABNORMAL HIGH (ref 8–23)
CO2: 28 mmol/L (ref 22–32)
Calcium: 8.6 mg/dL — ABNORMAL LOW (ref 8.9–10.3)
Chloride: 95 mmol/L — ABNORMAL LOW (ref 98–111)
Creatinine, Ser: 1.63 mg/dL — ABNORMAL HIGH (ref 0.61–1.24)
GFR, Estimated: 44 mL/min — ABNORMAL LOW (ref >60)
Glucose, Bld: 119 mg/dL — ABNORMAL HIGH (ref 70–99)
Potassium: 4.5 mmol/L (ref 3.5–5.1)
Sodium: 131 mmol/L — ABNORMAL LOW (ref 135–145)

## 2020-03-20 LAB — CBC
HCT: 41.2 % (ref 39.0–52.0)
Hemoglobin: 13.9 g/dL (ref 13.0–17.0)
MCH: 31.2 pg (ref 26.0–34.0)
MCHC: 33.7 g/dL (ref 30.0–36.0)
MCV: 92.4 fL (ref 80.0–100.0)
Platelets: 96 10*3/uL — ABNORMAL LOW (ref 150–400)
RBC: 4.46 MIL/uL (ref 4.22–5.81)
RDW: 17.8 % — ABNORMAL HIGH (ref 11.5–15.5)
WBC: 10.4 10*3/uL (ref 4.0–10.5)
nRBC: 0 % (ref 0.0–0.2)

## 2020-03-20 LAB — BRAIN NATRIURETIC PEPTIDE: B Natriuretic Peptide: 2072 pg/mL — ABNORMAL HIGH (ref 0.0–100.0)

## 2020-03-20 MED ORDER — TORSEMIDE 20 MG PO TABS
40.0000 mg | ORAL_TABLET | Freq: Once | ORAL | Status: AC
  Administered 2020-03-20: 17:00:00 40 mg via ORAL
  Filled 2020-03-20 (×2): qty 2

## 2020-03-20 NOTE — ED Provider Notes (Signed)
[facility] EMERGENCY DEPARTMENT Provider Note   CSN: [id number] Arrival date & time: 03/20/20  1210     History Chief Complaint  Patient presents with  . Shortness of Breath    John [name] is a 74 y.o. male with past medical history significant for SVT, pacemaker, CHF, a flutter, obesity, diabetes who presents for evaluation of lower extremity swelling.  Was recently discharged from the hospital 1 week ago.  He was admitted at that time for hypotension, or mental status, symptomatic cholelithiasis.  His hypotension resolved after his torsemide was cut in half and he was taken off his spironolactone.  He also had improvement in his creatinine.  Patient was deemed not a candidate for surgical intervention for symptomatically lithiasis.  He denies any pain in his abdomen currently.  States his shortness of breath is at his baseline.  Has noted significant increase in his lower extremity swelling to his bilateral legs up to his knees since discharge.  He does not check his weights daily.  He denies fever, chills, nausea, vomiting, headache, lightness, dizziness, chest pain, worsening shortness of breath, hemoptysis abdominal pain, diarrhea, dysuria.  No redness or warmth to his lower extremities. Chronically sleeps in recliner at home. Denies additional aggravating or alleviating factors.  History obtained from patient, wife in room and past medical records. No interpreter used  HPI     Past Medical History:  Diagnosis Date  . Atrial flutter ([place]) 12/2010   Admitted with symptomatic bradycardia (HR 40s), atrial flutter with slow ventricular response 12/2010 + volume overload; AV nodal agents d/c'd and Pradaxa started; RFA in 01/2011  . CHF (congestive heart failure) ([place])   . Chronic combined systolic and diastolic heart failure ([place])    a. echo 01/06/11: mild LVH, EF 65-70%, mod to severe LAE, mild RVE, mild RAE, PASP 32;   TEE 10/12: EF 45-50% b. EF 45 to 50% by echo in 07/2018  .  Class 2 severe obesity due to excess calories with serious comorbidity and body mass index (BMI) of 37.0 to 37.9 in adult ([place]) 07/17/2017  . Diabetes mellitus    non insulin dependant  . Hyperlipidemia   . Hypertension   . Osteoarthritis   . Presence of permanent cardiac pacemaker   . PSVT (paroxysmal supraventricular tachycardia) (HCC)    Possibly atrial flutter    Patient Active Problem List   Diagnosis Date Noted  . Acute renal failure superimposed on stage 3b chronic kidney disease ([place]) 03/10/2020  . Lobar pneumonia ([place]) 03/10/2020  . Permanent atrial fibrillation ([place]) 03/10/2020  . Goals of care, counseling/discussion 03/10/2020  . Calculus of gallbladder without cholecystitis without obstruction   . Acute respiratory failure with hypoxemia ([place]) 03/09/2020  . Acute kidney injury superimposed on CKD ([place]) 03/09/2020  . Hyperammonemia ([place]) 03/09/2020  . Elevated liver enzymes 03/09/2020  . Thrombocytopenia ([place]) 03/09/2020  . Hypoalbuminemia 03/09/2020  . Dehydration 03/09/2020  . Transaminitis 03/09/2020  . Multifocal pneumonia 03/09/2020  . Acute cholecystitis 03/09/2020  . Prolonged QT interval 03/09/2020  . Diffuse papular rash 03/09/2020  . NASH (nonalcoholic steatohepatitis) 03/03/2020  . Elevated LFTs 03/03/2020  . Constipation 03/03/2020  . Lower leg edema 10/14/2019  . Class 2 obesity   . Acute on chronic combined systolic and diastolic CHF (congestive heart failure) ([place]) 11/12/2018  . Hyponatremia 11/11/2018  . Hyperbilirubinemia 11/11/2018  . CHF (congestive heart failure) ([place]) 08/19/2018  . Stage 3 chronic kidney disease ([place])   . Paroxysmal atrial fibrillation (HCC)   .  Chronic HFrEF (heart failure with reduced ejection fraction) ([place]) 03/02/2018  . Pacemaker 03/02/2018  . History of colonic polyps 12/28/2016  . Atrial pacemaker lead displacement 12/07/2015  . Acute CHF (congestive heart failure) ([place]) 11/21/2015  . CHF, acute on chronic ([place])  11/20/2015  . Complete heart block ([place]) 11/20/2015  . Chronic diastolic CHF (congestive heart failure) ([place]) 08/28/2015  . Hypertensive heart disease 08/28/2015  . NSVT (nonsustained ventricular tachycardia) ([place]) 08/28/2015  . Elevated troponin 08/28/2015  . Second degree heart block 08/25/2015  . Bradycardia 08/25/2015  . Acute diastolic CHF (congestive heart failure) ([place]) 08/25/2015  . Current use of long term anticoagulation 05/04/2011  . Atrial flutter ([place]) 01/05/2011  . DM type 2 causing vascular disease ([place]) 12/23/2009  . Mixed hyperlipidemia 12/23/2009  . Essential hypertension, benign 12/23/2009  . DEGENERATIVE JOINT DISEASE 12/23/2009    Past Surgical History:  Procedure Laterality Date  . ATRIAL FLUTTER ABLATION N/A 02/07/2011   Procedure: ATRIAL FLUTTER ABLATION;  Surgeon: Evans Lance, MD;  Location: [facility] CATH LAB;  Service: Cardiovascular;  Laterality: N/A;  . CARDIAC ELECTROPHYSIOLOGY STUDY AND ABLATION  02/07/11  . CARDIOVERSION N/A 03/23/2016   Procedure: CARDIOVERSION;  Surgeon: Evans Lance, MD;  Location: [place];  Service: Cardiovascular;  Laterality: N/A;  . COLONOSCOPY N/A 04/17/2017   Procedure: COLONOSCOPY;  Surgeon: Rogene Houston, MD;  [facility] ENDO SU[facility];  Service: Endoscopy;  Laterality: N/A;  830  . EP IMPLANTABLE DEVICE N/A 08/29/2015   Procedure: Pacemaker Implant;  Surgeon: Evans Lance, MD;  Location: [place] CV LAB;  Service: Cardiovascular;  Laterality: N/A;  . EP IMPLANTABLE DEVICE N/A 12/07/2015   Procedure: PPM Lead Revision/Repair;  Surgeon: Evans Lance, MD;  Location: [place] CV LAB;  Service: Cardiovascular;  Laterality: N/A;  . KNEE ARTHROSCOPY  2005   left  . LUMBAR SPINE SURGERY     "I've had 6 ORs 1972 thru 2004"  . POLYPECTOMY  04/17/2017   Procedure: POLYPECTOMY;  Surgeon: Rogene Houston, MD;  Location[facility] ENDO [facility];  Service: Endoscopy;;  transverse colon x3;  . RIGHT/LEFT HEART CATH AND CORONARY  ANGIOGRAPHY N/A 12/21/2019   Procedure: RIGHT/LEFT HEART CATH AND CORONARY ANGIOGRAPHY;  Surgeon: Nelva Bush, MD;  Location: [place] CV LAB;  Service: Cardiovascular;  Laterality: N/A;  . TEE WITHOUT CARDIOVERSION N/A 11/05/2019   Procedure: TRANSESOPHAGEAL ECHOCARDIOGRAM (TEE) WITH PROPOFOL;  Surgeon: Arnoldo Lenis, MD;  [facility];  Service: Endoscopy;  Laterality: N/A;       Family History  Problem Relation Age of Onset  . Heart attack Mother   . Hypertension Mother   . Heart attack Father   . Hypertension Father   . Heart attack Brother   . Colon cancer Neg Hx     Social History   Tobacco Use  . Smoking status: Former Smoker    Packs/day: 1.00    Years: 10.00    Pack years: 10.00    Types: Cigarettes    Quit date: 03/26/1986    Years since quitting: 34.0  . Smokeless tobacco: Never Used  . Tobacco comment: "stopped cigarette  smoking 1988"  Vaping Use  . Vaping Use: Never used  Substance Use Topics  . Alcohol use: Not Currently    Alcohol/week: 0.0 standard drinks    Comment: "quit alcohol ~ 2007"  . Drug use: No    Home Medications Prior to Admission medications   Medication Sig Start Date End Date Taking? Authorizing  Provider  ACCU-CHEK AVIVA PLUS test strip TEST BLOOD SUGAR TWICE DAILY BEFORE BREAKFAST AND AT BEDTIME 10/05/19   Cassandria Anger, MD  acetaminophen (TYLENOL) 500 MG tablet Take 500 mg by mouth every 6 (six) hours as needed.    [provider]  amoxicillin-clavulanate (AUGMENTIN) 875-125 MG tablet Take 1 tablet by mouth every 12 (twelve) hours. 03/14/20   Orson Eva, MD  apixaban (ELIQUIS) 5 MG TABS tablet Take 1 tablet (5 mg total) by mouth 2 (two) times daily. 12/22/19   End, Harrell Gave, MD  benzonatate (TESSALON) 100 MG capsule Take 1 capsule (100 mg total) by mouth 3 (three) times daily as needed for cough. 01/05/20   Evans Lance, MD  carvedilol (COREG) 3.125 MG tablet Take 1 tablet (3.125 mg total) by  mouth 2 (two) times daily with a meal. 03/14/20   Tat, Shanon Brow, MD  doxycycline (VIBRA-TABS) 100 MG tablet Take 1 tablet (100 mg total) by mouth every 12 (twelve) hours. 03/14/20   Orson Eva, MD  HYDROcodone-acetaminophen (NORCO/VICODIN) 5-325 MG tablet Take one tablet po every 4 hrs prn pain Patient taking differently: Take 1 tablet by mouth every 4 (four) hours as needed for moderate pain. 10/10/18   Mikey Kirschner, MD  hydrocortisone 2.5 % cream Apply BID to affected area Patient taking differently: Apply 1 application topically 2 (two) times daily as needed (skin irritation.). 08/06/19   Mikey Kirschner, MD  insulin degludec (TRESIBA FLEXTOUCH) 200 UNIT/ML FlexTouch Pen Inject 10 Units into the skin at bedtime. 03/14/20   Orson Eva, MD  Insulin Pen Needle (BD PEN NEEDLE NANO U/F) 32G X 4 MM MISC 1 each by Does not apply route 4 (four) times daily. 12/23/18   Cassandria Anger, MD  Multiple Vitamin (MULTIVITAMIN WITH MINERALS) TABS tablet Take 1 tablet by mouth daily. One A Day for Men    [provider]  potassium chloride SA (KLOR-CON) 20 MEQ tablet Take 1.5 tablets (30 mEq total) by mouth daily. Take 60 meq in the am, at lunch, and in the evening 03/14/20   Tat, Shanon Brow, MD  predniSONE (DELTASONE) 10 MG tablet Take 5 tablets (50 mg total) by mouth daily with breakfast. And decrease by one tablet daily 03/15/20   Tat, Shanon Brow, MD  sildenafil (VIAGRA) 50 MG tablet Take 1 tablet (50 mg total) by mouth daily as needed for erectile dysfunction. 02/10/19   Evans Lance, MD  torsemide (DEMADEX) 20 MG tablet Take 2 tablets (40 mg total) by mouth daily. TAKE 2 TABLETS (40 MG TOTAL) BY MOUTH 2 (TWO) TIMES DAILY. 03/14/20   Orson Eva, MD  TRULICITY 1.5 PP/5.0DT SOPN INJECT 1.5MG (1 PEN) SUBCUTANEOUSLY EVERY WEEK Patient taking differently: Inject 1.5 mg into the skin every 7 (seven) days. 12/29/19   Cassandria Anger, MD  Zinc 50 MG TABS Take 50 mg by mouth daily.    [provider]    Allergies    Patient has no known allergies.  Review of Systems   Review of Systems  Constitutional: Negative.   HENT: Negative.   Respiratory: Positive for cough (Chronic) and shortness of breath (Chronic). Negative for apnea, choking, chest tightness, wheezing and stridor.   Cardiovascular: Positive for leg swelling. Negative for chest pain and palpitations.  Gastrointestinal: Negative.   Genitourinary: Negative.   Musculoskeletal: Negative.   Skin: Negative.   Neurological: Negative.   All other systems reviewed and are negative.   Physical Exam Updated Vital Signs BP 103/79  Pulse (!) 59   Temp 97.8 F (36.6 C) (Oral)   Resp (!) 21   Ht 6' (1.829 m)   Wt 103 kg   SpO2 98%   BMI 30.80 kg/m   Physical Exam Vitals and nursing note reviewed.  Constitutional:      General: He is not in acute distress.    Appearance: He is well-developed and well-nourished. He is ill-appearing (Chronically ill appearing). He is not toxic-appearing or diaphoretic.  HENT:     Head: Normocephalic and atraumatic.     Mouth/Throat:     Mouth: Mucous membranes are moist.  Eyes:     Pupils: Pupils are equal, round, and reactive to light.  Cardiovascular:     Rate and Rhythm: Normal rate and regular rhythm.     Pulses:          Dorsalis pedis pulses are 1+ on the right side and 1+ on the left side.  Pulmonary:     Effort: Pulmonary effort is normal. No tachypnea, accessory muscle usage or respiratory distress.     Breath sounds: Normal breath sounds.     Comments: Clear to auscultation bilaterally.  Speaks in full sentences without difficulty. Chest:     Comments: Defibrillator scar to left upper chest wall.  No crepitus or step-off Abdominal:     General: Bowel sounds are normal. There is no distension.     Palpations: Abdomen is soft.     Tenderness: There is no abdominal tenderness. There is no right CVA tenderness, left CVA tenderness, guarding or rebound. Negative signs  include Murphy's sign and McBurney's sign.     Hernia: No hernia is present.     Comments: SoftProtuberant abdomen.,  Nontender.  Musculoskeletal:        General: Normal range of motion.     Cervical back: Normal range of motion and neck supple.     Comments: Moves all 4 extremities at difficulty.  No bony tenderness.  3+ edema to knees bilaterally  Feet:     Right foot:     Skin integrity: Skin integrity normal.     Left foot:     Skin integrity: Skin integrity normal.  Skin:    General: Skin is warm and dry.     Capillary Refill: Capillary refill takes 2 to 3 seconds.     Comments: No erythema or warmth.  Neurological:     General: No focal deficit present.     Mental Status: He is alert.     Cranial Nerves: Cranial nerves are intact.     Sensory: Sensation is intact.     Motor: Motor function is intact.     Comments: Cn 2- 12 grossly intact Intact sensation  Psychiatric:        Mood and Affect: Mood and affect normal.    ED Results / Procedures / Treatments   Labs (all labs ordered are listed, but only abnormal results are displayed) Labs Reviewed  BASIC METABOLIC PANEL - Abnormal; Notable for the following components:      Result Value   Sodium 131 (*)    Chloride 95 (*)    Glucose, Bld 119 (*)    BUN 46 (*)    Creatinine, Ser 1.63 (*)    Calcium 8.6 (*)    GFR, Estimated 44 (*)    All other components within normal limits  CBC - Abnormal; Notable for the following components:   RDW 17.8 (*)    Platelets 96 (*)  All other components within normal limits  BRAIN NATRIURETIC PEPTIDE - Abnormal; Notable for the following components:   B Natriuretic Peptide 2,072.0 (*)    All other components within normal limits  RESP PANEL BY RT-PCR (FLU A&B, COVID) [id number]    EKG EKG Interpretation  Date/Time:  Sunday March 20 2020 12:24:11 EST Ventricular Rate:  68 PR Interval:    QRS Duration: 188 QT Interval:  500 QTC Calculation: 531 R Axis:   -119 Text  Interpretation: Sinus rhythm with complete heart block and Ventricular-paced rhythm Abnormal ECG Confirmed by Gerlene Fee [phone]) on 03/20/2020 3:15:31 PM   Radiology DG Chest 2 View  Result Date: 03/20/2020 CLINICAL DATA:  Shortness of breath EXAM: CHEST - 2 VIEW COMPARISON:  03/09/2020 FINDINGS: Cardiac shadow is enlarged but stable. Pacing device is again seen. Vascular congestion is noted with mild interstitial edema. Improved aeration is noted at the right lung base although some persistent infiltrate is seen. IMPRESSION: Persistent but improved infiltrate in the right lung base. Mild underlying CHF. Electronically Signed   By: Inez Catalina M.D.   On: 03/20/2020 13:47   Procedures Procedures (including critical care time)  Medications Ordered in ED Medications  torsemide (DEMADEX) tablet 40 mg (has no administration in time range)    ED Course  I have reviewed the triage vital signs and the nursing notes.  Pertinent labs & imaging results that were available during my care of the patient were reviewed by me and considered in my medical decision making (see chart for details).  74 year old chronically ill-appearing presents for evaluation of lower extremity edema.  Recently discharged 1 week ago.  On his prior admission he was noted to have soft blood pressures and mild AKI.  He had his torsemide reduced as well as was taken off his spironolactone at DC.  Presents today due to increased lower extremity edema.  He states that shortness of breath is at his baseline.  His heart and lungs are clear.  His abdomen is soft, nontender.  His abdominal pain has resolved from his prior admission.  He denies any PND orthopnea.  Sleeps in a recliner at baseline.  He does have 3+ pitting edema to knees bilaterally.  No evidence of cellulitis.  Patient is without any hypoxia.  Oxygen saturation 98%, ambulating in room without difficulty.  His BP in the ED or at his baseline.  Labs obtained from triage  which I personally reviewed and interpreted:  BNP is mildly up at 2072 CBC without leukocytosis, platelets 96, improved from previous Metabolic panel mild hyponatremia at 131, hyperglycemia 119, creatinine 1.63, improved from hospital discharge. Covid negative EKG without ischemic changes Chest x-ray with mild CHF, improved infiltrate to right lower lung base.  Thorough discussion with patient and wife in room.  Wife and patient states he does feel improved from his prior hospitalization however they are just concerned about his lower extremity edema.  Given his labs, blood pressure have improved with prior hospitalization we will have him go back to his furosemide.  He will still hold on his spironolactone.  I encouraged patient to weigh himself daily.  He has no exertional dyspnea.  Encouraged him and wife to follow-up with cardiology tomorrow to let them and their stay in the ED to have close follow-up this week.  He will return for any worsening symptoms.  He has no chest pain to suggest ACS, PE, dissection.  States his breathing is at his baseline.  The patient has been appropriately  medically screened and/or stabilized in the ED. I have low suspicion for any other emergent medical condition which would require further screening, evaluation or treatment in the ED or require inpatient management.  Patient is hemodynamically stable and in no acute distress.  Patient able to ambulate in department prior to ED.  Evaluation does not show acute pathology that would require ongoing or additional emergent interventions while in the emergency department or further inpatient treatment.  I have discussed the diagnosis with the patient and answered all questions.  Pain is been managed while in the emergency department and patient has no further complaints prior to discharge.  Patient is comfortable with plan discussed in room and is stable for discharge at this time.  I have discussed strict return precautions  for returning to the emergency department.  Patient was encouraged to follow-up with PCP/specialist refer to at discharge.  Patient seen eval by attending, Dr. Sedonia Small who agrees with above treatment, plan and physician    MDM Rules/Calculators/A&P                          [name] was evaluated in Emergency Department on 03/20/2020 for the symptoms described in the history of present illness. He was evaluated in the context of the global COVID-19 pandemic, which necessitated consideration that the patient might be at risk for infection with the SARS-CoV-2 virus that causes COVID-19. Institutional protocols and algorithms that pertain to the evaluation of patients at risk for COVID-19 are in a state of rapid change based on information released by regulatory bodies including the CDC and federal and state organizations. These policies and algorithms were followed during the patient's care in the ED. Final Clinical Impression(s) / ED Diagnoses Final diagnoses:  Acute on chronic congestive heart failure, unspecified heart failure type [facility])  Lower extremity edema    Rx / DC Orders ED Discharge Orders    None       Mohammedali Bedoy A, PA-C 03/20/20 1530    Maudie Flakes, MD 03/20/20 2252

## 2020-03-20 NOTE — ED Triage Notes (Signed)
Pt to er, pt states that he was admitted to the hospital last week and was d/c on the 20th for heart failure, states that he is back because he is still sob, states that it is worse when he lays flat.  States that he also has a lot of swelling in his legs.

## 2020-03-20 NOTE — Discharge Instructions (Addendum)
Go back to taking the torsemide twice daily as you were previously.  Please weigh yourself daily.  Call cardiology tomorrow to let them know you are seen here in the emergency department as you need to be seen this week.  Return for any worsening symptoms.

## 2020-03-22 DIAGNOSIS — K8000 Calculus of gallbladder with acute cholecystitis without obstruction: Secondary | ICD-10-CM | POA: Diagnosis not present

## 2020-03-22 DIAGNOSIS — I5042 Chronic combined systolic (congestive) and diastolic (congestive) heart failure: Secondary | ICD-10-CM | POA: Diagnosis not present

## 2020-03-22 DIAGNOSIS — I081 Rheumatic disorders of both mitral and tricuspid valves: Secondary | ICD-10-CM | POA: Diagnosis not present

## 2020-03-22 DIAGNOSIS — N184 Chronic kidney disease, stage 4 (severe): Secondary | ICD-10-CM | POA: Diagnosis not present

## 2020-03-22 DIAGNOSIS — I10 Essential (primary) hypertension: Secondary | ICD-10-CM | POA: Diagnosis not present

## 2020-03-22 DIAGNOSIS — J181 Lobar pneumonia, unspecified organism: Secondary | ICD-10-CM | POA: Diagnosis not present

## 2020-03-22 DIAGNOSIS — E1122 Type 2 diabetes mellitus with diabetic chronic kidney disease: Secondary | ICD-10-CM | POA: Diagnosis not present

## 2020-03-22 DIAGNOSIS — I130 Hypertensive heart and chronic kidney disease with heart failure and stage 1 through stage 4 chronic kidney disease, or unspecified chronic kidney disease: Secondary | ICD-10-CM | POA: Diagnosis not present

## 2020-03-22 DIAGNOSIS — J9601 Acute respiratory failure with hypoxia: Secondary | ICD-10-CM | POA: Diagnosis not present

## 2020-03-22 DIAGNOSIS — I0981 Rheumatic heart failure: Secondary | ICD-10-CM | POA: Diagnosis not present

## 2020-03-22 NOTE — Progress Notes (Addendum)
Cardiology Office Note  Date: 03/23/2020   ID: John [name], DOB Sep 14, 1945, MRN [id number]  PCP:  Erven Colla, DO  Cardiologist:  [name], MD Electrophysiologist:  None   Chief Complaint:  Hospital Follow-up  History of Present Illness: John [name] is a 73 y.o. male with a history of chronic combined systolic and diastolic heart failure, atrial flutter, HLD, HTN, PSVT, [place] cirrhosis, complete heart block status post permanent pacemaker, DM 2.    Last encounter with Dr. Harl Bowie 01/05/2020.  His weight had decreased from 223 pounds down to 217 pounds and stable.  Had no recent edema.  Breathing was improving.  Had occasional orthopnea.  He was continuing torsemide 40 mg daily.  He was restarted on spironolactone 12.5 mg daily.  He was continuing Coreg 25 mg p.o. twice daily.  Not on ACE, ARB, ARNI due to renal function.  Medical therapy was limited due to soft blood pressures and renal dysfunction.  He was planning on checking with GI if okay to restart statin.  Awaiting referral to nephrology.  Metformin was stopped around time of recent catheterization due to increased creatinine and dye load.  He planned on reaching out to Dr. Dorris Fetch as to whether to restart it.   He presented for office visit on 02/02/2020 for 1 month follow-up.  He denied any recent issues other than some occasional blood pressure is lower than 100.  He denied any symptoms with these low blood pressures.  Blood pressure was 114/80.  He denied any increase in dyspnea on exertion, weight gain, or lower extremity edema.  Denied any anginal symptoms.  No palpitations.  No dizziness, lightheadedness, presyncope or syncope.  No CVA or TIA-like symptoms.  No bleeding issues.  No claudication.  No DVT or PE-like symptoms.  Had a small increase in creatinine from 1.63-1.73 since last lab work.  He was seeing nephrology for renal disease.  He had been restarted on spironolactone 12.5 mg last visit.  Recent  hospital presentation on March 09, 2020 with 2 to 3-week history of shortness of breath, coughing, congestion, orthopnea.  Had intermittent abdominal pain for the prior month.  Admitting diagnosis was acute respiratory failure with hypoxia secondary to pulmonary edema and pneumonia.  He had cholelithiasis with gallbladder ultrasound demonstrating sludge and stones in gallbladder with gallbladder wall thickening.  He was started on empiric antibiotic with Zosyn.  Lasix was on hold secondary to hypovolemia.  Carvedilol was holding secondary to hypotension and soft blood pressure and volume depletion.  Had acute on chronic renal failure with peak serum creatinine of 2.62.  Which decreased to 2.07 at time of discharge.  Baseline creatinine of 1.5-1.8.  His atrial fibrillation was rate controlled.  Statin medication was on hold temporarily.  He was thrombocytopenic secondary to infection and cirrhosis.  Had a subsequent visit to the emergency room on March 20, 2020 for evaluation of lower extremity swelling.  He noted significant increase in lower extremity swelling to bilateral legs up to his knees since discharge from recent hospital stay.  His torsemide had been reduced as well as spironolactone was DC'd at prior hospitalization.  He had 3+ pitting edema to knees bilaterally.  BNP was mildly elevated at 2072.  Platelets were 96 improved from previous.  He was not checking his weights daily.  He was given Torsemide 40 mg during visit.  He was encouraged to weigh himself daily.  He was experiencing no exertional dyspnea.  He is here for  follow-up today status post recent hospital stay for lobar pneumonia and heart failure as well as acute respiratory failure with hypoxia, symptomatic cholelithiasis, acute on chronic renal failure, hyperammonemia, DM type II, persistent atrial fibrillation, complete heart block status post permanent pacemaker, hyponatremia.  Today he presents with no particular complaints other  than lower extremity edema and weight gain.  States he has been compliant with his medications but has not been weighing daily as requested.  His wife states he is basically been laying around the house.  He is continuing with outpatient antibiotics for his pneumonia.  He appears slightly jaundiced.  He continues taking torsemide 40 mg p.o. twice daily.  He is taking carvedilol 3.125 mg p.o. twice daily.  He is continuing Eliquis 5 mg p.o. twice daily.  He weighs 248 pounds today.  ED note states he weighed 103 kg equaling 226 pounds on December 26.  Past Medical History:  Diagnosis Date  . Atrial flutter ([place]) 12/2010   Admitted with symptomatic bradycardia (HR 40s), atrial flutter with slow ventricular response 12/2010 + volume overload; AV nodal agents d/c'd and Pradaxa started; RFA in 01/2011  . CHF (congestive heart failure) ([place])   . Chronic combined systolic and diastolic heart failure ([place])    a. echo 01/06/11: mild LVH, EF 65-70%, mod to severe LAE, mild RVE, mild RAE, PASP 32;   TEE 10/12: EF 45-50% b. EF 45 to 50% by echo in 07/2018  . Class 2 severe obesity due to excess calories with serious comorbidity and body mass index (BMI) of 37.0 to 37.9 in adult ([place]) 07/17/2017  . Diabetes mellitus    non insulin dependant  . Hyperlipidemia   . Hypertension   . Osteoarthritis   . Presence of permanent cardiac pacemaker   . PSVT (paroxysmal supraventricular tachycardia) (HCC)    Possibly atrial flutter    Past Surgical History:  Procedure Laterality Date  . ATRIAL FLUTTER ABLATION N/A 02/07/2011   Procedure: ATRIAL FLUTTER ABLATION;  Surgeon: Evans Lance, MD;  Location: [facility] CATH LAB;  Service: Cardiovascular;  Laterality: N/A;  . CARDIAC ELECTROPHYSIOLOGY STUDY AND ABLATION  02/07/11  . CARDIOVERSION N/A 03/23/2016   Procedure: CARDIOVERSION;  Surgeon: Evans Lance, MD;  Location: [facility];  Service: Cardiovascular;  Laterality: N/A;  . COLONOSCOPY N/A 04/17/2017   Procedure:  COLONOSCOPY;  Surgeon: Rogene [place], MD;  Location: [facility] ENDO SU[facility];  Service: Endoscopy;  Laterality: N/A;  830  . EP IMPLANTABLE DEVICE N/A 08/29/2015   Procedure: Pacemaker Implant;  Surgeon: Evans Lance, MD;  Location: [facility] CV LAB;  Service: Cardiovascular;  Laterality: N/A;  . EP IMPLANTABLE DEVICE N/A 12/07/2015   Procedure: PPM Lead Revision/Repair;  Surgeon: Evans Lance, MD;  Location: [facility] CV LAB;  Service: Cardiovascular;  Laterality: N/A;  . KNEE ARTHROSCOPY  2005   left  . LUMBAR SPINE SURGERY     "I've had 6 ORs 1972 thru 2004"  . POLYPECTOMY  04/17/2017   Procedure: POLYPECTOMY;  Surgeon: Rogene [place], MD;  Location: [facility] END[facility];  Service: Endoscopy;;  transverse colon x3;  . RIGHT/LEFT HEART CATH AND CORONARY ANGIOGRAPHY N/A 12/21/2019   Procedure: RIGHT/LEFT HEART CATH AND CORONARY ANGIOGRAPHY;  Surgeon: Nelva Bush, MD;  Location: [facility] CV LAB;  Service: Cardiovascular;  Laterality: N/A;  . TEE WITHOUT CARDIOVERSION N/A 11/05/2019   Procedure: TRANSESOPHAGEAL ECHOCARDIOGRAM (TEE) WITH PROPOFOL;  Surgeon: Arnoldo Lenis, MD;  Location[facility];  Service: Endoscopy;  Laterality:  N/A;    Current Outpatient Medications  Medication Sig Dispense Refill  . ACCU-CHEK AVIVA PLUS test strip TEST BLOOD SUGAR TWICE DAILY BEFORE BREAKFAST AND AT BEDTIME 200 strip 1  . acetaminophen (TYLENOL) 500 MG tablet Take 500 mg by mouth every 6 (six) hours as needed.    Marland Kitchen amoxicillin-clavulanate (AUGMENTIN) 875-125 MG tablet Take 1 tablet by mouth every 12 (twelve) hours. 6 tablet 0  . apixaban (ELIQUIS) 5 MG TABS tablet Take 1 tablet (5 mg total) by mouth 2 (two) times daily. 180 tablet 3  . benzonatate (TESSALON) 100 MG capsule Take 1 capsule (100 mg total) by mouth 3 (three) times daily as needed for cough. 90 capsule 3  . carvedilol (COREG) 3.125 MG tablet Take 1 tablet (3.125 mg total) by mouth 2 (two) times daily with a meal. 60 tablet 1  .  doxycycline (VIBRA-TABS) 100 MG tablet Take 1 tablet (100 mg total) by mouth every 12 (twelve) hours. 6 tablet 0  . HYDROcodone-acetaminophen (NORCO/VICODIN) 5-325 MG tablet Take one tablet po every 4 hrs prn pain (Patient taking differently: Take 1 tablet by mouth every 4 (four) hours as needed for moderate pain.) 28 tablet 0  . hydrocortisone 2.5 % cream Apply BID to affected area (Patient taking differently: Apply 1 application topically 2 (two) times daily as needed (skin irritation.).) 60 g 3  . insulin degludec (TRESIBA FLEXTOUCH) 200 UNIT/ML FlexTouch Pen Inject 10 Units into the skin at bedtime. 30 mL 1  . Insulin Pen Needle (BD PEN NEEDLE NANO U/F) 32G X 4 MM MISC 1 each by Does not apply route 4 (four) times daily. 150 each 5  . Multiple Vitamin (MULTIVITAMIN WITH MINERALS) TABS tablet Take 1 tablet by mouth daily. One A Day for Men    . potassium chloride SA (KLOR-CON) 20 MEQ tablet Take 1.5 tablets (30 mEq total) by mouth daily. Take 60 meq in the am, at lunch, and in the evening 270 tablet 6  . predniSONE (DELTASONE) 10 MG tablet Take 5 tablets (50 mg total) by mouth daily with breakfast. And decrease by one tablet daily 15 tablet 0  . sildenafil (VIAGRA) 50 MG tablet Take 1 tablet (50 mg total) by mouth daily as needed for erectile dysfunction. 30 tablet 3  . torsemide (DEMADEX) 20 MG tablet Take 2 tablets (40 mg total) by mouth daily. TAKE 2 TABLETS (40 MG TOTAL) BY MOUTH 2 (TWO) TIMES DAILY. 60 tablet 1  . TRULICITY 1.5 HQ/4.6NG SOPN INJECT 1.5MG (1 PEN) SUBCUTANEOUSLY EVERY WEEK (Patient taking differently: Inject 1.5 mg into the skin every 7 (seven) days.) 6 mL 0  . Zinc 50 MG TABS Take 50 mg by mouth daily.     No current facility-administered medications for this visit.   Allergies:  Patient has no known allergies.   Social History: The patient  reports that he quit smoking about 34 years ago. His smoking use included cigarettes. He has a 10.00 pack-year smoking history. He has  never used smokeless tobacco. He reports previous alcohol use. He reports that he does not use drugs.   Family History: The patient's family history includes Heart attack in his brother, father, and mother; Hypertension in his father and mother.   ROS:  Please see the history of present illness. Otherwise, complete review of systems is positive for none.  All other systems are reviewed and negative.   Physical Exam: VS:  BP 104/70   Pulse 87   Resp 16   Ht 6' (  1.829 m)   Wt 248 lb 12.8 oz (112.9 kg)   SpO2 97%   BMI 33.74 kg/m , BMI Body mass index is 33.74 kg/m.  Wt Readings from Last 3 Encounters:  03/23/20 248 lb 12.8 oz (112.9 kg)  03/20/20 227 lb 1.2 oz (103 kg)  03/11/20 227 lb 4.7 oz (103.1 kg)    General: Obese patient appears comfortable at rest. Neck: Supple, no elevated JVP or carotid bruits, no thyromegaly. Lungs: Clear to auscultation, nonlabored breathing at rest. Cardiac: Regular rate and rhythm, no S3 or significant systolic murmur, no pericardial rub. Extremities: 1+ pitting edema, distal pulses 2+. Skin: Warm and dry.  Appears jaundiced Musculoskeletal: No kyphosis. Neuropsychiatric: Alert and oriented x3, affect grossly appropriate.  ECG:  EKG 03/20/2020 sinus rhythm with complete heart block and ventricular paced rhythm rate of 68.  Recent Labwork: 03/12/2020: ALT 20; AST 35 03/14/2020: Magnesium 2.7 03/20/2020: B Natriuretic Peptide 2,072.0; BUN 46; Creatinine, Ser 1.63; Hemoglobin 13.9; Platelets 96; Potassium 4.5; Sodium 131     Component Value Date/Time   CHOL 178 08/12/2019 0809   CHOL 142 01/17/2018 0835   TRIG 100 08/12/2019 0809   HDL 51 08/12/2019 0809   HDL 53 01/17/2018 0835   CHOLHDL 3.5 08/12/2019 0809   VLDL 20 08/12/2019 0809   LDLCALC 107 (H) 08/12/2019 0809   LDLCALC 71 01/17/2018 0835    Other Studies Reviewed Today:  Echocardiogram: 07/2018 IMPRESSIONS 1. The left ventricle has mildly reduced systolic function, with an  ejection fraction of 45-50%. The cavity size was mild to moderately dilated. There is mildly increased left ventricular wall thickness. Left ventricular diastolic Doppler parameters are consistent with pseudonormalization. Left ventrical global hypokinesis without regional wall motion abnormalities. 2. The right ventricle has normal systolic function. The cavity was normal. There is no increase in right ventricular wall thickness. 3. Left atrial size was severely dilated. 4. The aortic valve is tricuspid. Mild thickening of the aortic valve. Moderate calcification of the aortic valve. Mild stenosis of the aortic valve. 5. The ascending aorta is normal in size and structure. 6. The inferior vena cava was dilated in size with <50% respiratory variability.  09/2019 echo 1. Left ventricular ejection fraction, by estimation, is 30 to 35%. The  left ventricle has moderately decreased function. The left ventricle  demonstrates global hypokinesis with abnormal septal motion possibly  consistent with pacing. The left  ventricular internal cavity size was mildly dilated. Left ventricular  diastolic parameters are indeterminate.  2. Right ventricular systolic function is mildly reduced. The right  ventricular size is normal. There is mildly elevated pulmonary artery  systolic pressure. The estimated right ventricular systolic pressure is  16.1 mmHg.  3. Left atrial size was moderately dilated.  4. Right atrial size was moderately dilated.  5. The mitral valve is abnormal, mildly thickened and calcified. Moderate  to severe mitral valve regurgitation, eccentric and posteriorly directed.  Not clear that PISA calculations best define degree of regurgitation.  6. The aortic valve has an indeterminant number of cusps and is severely  calcified. Aortic valve mean gradient measures 15.5 mmHg. Cannot exclude  severe low gradient aortic stenosis with dimentionless index of 0.23.  7. The inferior  vena cava is dilated in size with <50% respiratory  variability, suggesting right atrial pressure of 15 mmHg.   Assessment and Plan:   1. Chronic systolic heart failure (HCC) / Lower extremity edema Denies any recent increase in dyspnea on exertion, weight gain, or lower extremity edema.  Continue carvedilol 3.125 mg p.o. twice daily. Continue torsemide 40 mg p.o. twice daily.  Please refer to advanced heart failure clinic.  Blood pressure is soft today at 104/78.  No room for adjusting diuretics.  Creatinine has improved some.  Last labs creatinine was 1.63 and GFR was 44.  He has gained approximately 22 pounds based on previous ED weight on 03/20/2020 of 226 pounds.  Today's weight is 248 pounds.  2. Mixed hyperlipidemia Continue pravastatin 20 mg p.o. daily.  Lipid panel 08/12/2019: TC 178, TG 100, HDL 51, LDL 107.  3. Stage 3 chronic kidney disease, unspecified whether stage 3a or 3b CKD (HCC) Recent creatinine 1.63 and GFR 44.  He follows with Dr. Theador Hawthorne nephrology.  4. Type 2 diabetes mellitus without complication, with long-term current use of insulin ([place]) States his diabetes has been well controlled.  States recently has been as low as the 60s and sometimes has to take orange Juice when blood sugar is low.  5.  Atrial flutter Heart rate controlled at 87 today.  Continue to Eliquis 5 mg p.o. twice daily.  Continue carvedilol 3.125 mg p.o. twice daily.  Medication Adjustments/Labs and Tests Ordered: Current medicines are reviewed at length with the patient today.  Concerns regarding medicines are outlined above.   Disposition: Follow-up with Dr. Harl Bowie or APP 6 months  Signed, Levell July, NP 03/23/2020 8:44 AM    [facility] [place], [place], [place] [address] Phone: [phone]; Fax: [phone]

## 2020-03-23 ENCOUNTER — Telehealth: Payer: Self-pay | Admitting: Family Medicine

## 2020-03-23 ENCOUNTER — Ambulatory Visit (INDEPENDENT_AMBULATORY_CARE_PROVIDER_SITE_OTHER): Payer: Medicare HMO | Admitting: Family Medicine

## 2020-03-23 ENCOUNTER — Encounter: Payer: Self-pay | Admitting: Family Medicine

## 2020-03-23 ENCOUNTER — Other Ambulatory Visit: Payer: Self-pay

## 2020-03-23 VITALS — BP 104/70 | HR 87 | Resp 16 | Ht 72.0 in | Wt 248.8 lb

## 2020-03-23 DIAGNOSIS — N183 Chronic kidney disease, stage 3 unspecified: Secondary | ICD-10-CM | POA: Diagnosis not present

## 2020-03-23 DIAGNOSIS — I5042 Chronic combined systolic (congestive) and diastolic (congestive) heart failure: Secondary | ICD-10-CM

## 2020-03-23 DIAGNOSIS — I442 Atrioventricular block, complete: Secondary | ICD-10-CM

## 2020-03-23 DIAGNOSIS — I4819 Other persistent atrial fibrillation: Secondary | ICD-10-CM

## 2020-03-23 NOTE — Telephone Encounter (Signed)
May have verbal order

## 2020-03-23 NOTE — Patient Instructions (Signed)
Medication Instructions:  Your physician recommends that you continue on your current medications as directed. Please refer to the Current Medication list given to you today.  *If you need a refill on your cardiac medications before your next appointment, please call your pharmacy*   Lab Work: None today If you have labs (blood work) drawn today and your tests are completely normal, you will receive your results only by: Marland Kitchen MyChart Message (if you have MyChart) OR . A paper copy in the mail If you have any lab test that is abnormal or we need to change your treatment, we will call you to review the results.   Testing/Procedures: None today   Follow-Up: At [facility], you and your health needs are our priority.  As part of our continuing mission to provide you with exceptional heart care, we have created designated Provider Care Teams.  These Care Teams include your primary Cardiologist (physician) and Advanced Practice Providers (APPs -  Physician Assistants and Nurse Practitioners) who all work together to provide you with the care you need, when you need it.  We recommend signing up for the patient portal called "MyChart".  Sign up information is provided on this After Visit Summary.  MyChart is used to connect with patients for Virtual Visits (Telemedicine).  Patients are able to view lab/test results, encounter notes, upcoming appointments, etc.  Non-urgent messages can be sent to your provider as well.   To learn more about what you can do with MyChart, go to [email or web address].    Your next appointment:   6 month(s)  The format for your next appointment:   In Person  Provider:   [name], MD   Other Instructions I have enrolled you in Advanced heart failure Clinic in [place]. They will call you to set up appointment.

## 2020-03-23 NOTE — Telephone Encounter (Signed)
Verbal orders given on amy's vm.

## 2020-03-23 NOTE — Telephone Encounter (Signed)
Left detailed message on Amy voicemail.

## 2020-03-23 NOTE — Telephone Encounter (Signed)
Amy from [facility] calling to get verbal orders for patient to have PT twice a week for 4 weeks and then once a week for 4 weeks. Please advise. Thank you  Amy CB# [phone]

## 2020-03-25 DIAGNOSIS — K8000 Calculus of gallbladder with acute cholecystitis without obstruction: Secondary | ICD-10-CM | POA: Diagnosis not present

## 2020-03-25 DIAGNOSIS — I130 Hypertensive heart and chronic kidney disease with heart failure and stage 1 through stage 4 chronic kidney disease, or unspecified chronic kidney disease: Secondary | ICD-10-CM | POA: Diagnosis not present

## 2020-03-25 DIAGNOSIS — J181 Lobar pneumonia, unspecified organism: Secondary | ICD-10-CM | POA: Diagnosis not present

## 2020-03-25 DIAGNOSIS — I5042 Chronic combined systolic (congestive) and diastolic (congestive) heart failure: Secondary | ICD-10-CM | POA: Diagnosis not present

## 2020-03-25 DIAGNOSIS — I0981 Rheumatic heart failure: Secondary | ICD-10-CM | POA: Diagnosis not present

## 2020-03-25 DIAGNOSIS — E1122 Type 2 diabetes mellitus with diabetic chronic kidney disease: Secondary | ICD-10-CM | POA: Diagnosis not present

## 2020-03-25 DIAGNOSIS — J9601 Acute respiratory failure with hypoxia: Secondary | ICD-10-CM | POA: Diagnosis not present

## 2020-03-25 DIAGNOSIS — N184 Chronic kidney disease, stage 4 (severe): Secondary | ICD-10-CM | POA: Diagnosis not present

## 2020-03-25 DIAGNOSIS — I081 Rheumatic disorders of both mitral and tricuspid valves: Secondary | ICD-10-CM | POA: Diagnosis not present

## 2020-03-28 NOTE — Progress Notes (Addendum)
ADVANCED HF CLINIC CONSULT NOTE  Referring Physician: Levell July NP, Cardiology  Primary Care: Erven Colla, DO Primary Cardiologist: Dr. Harl Bowie   Reason for Referral: Chronic Combined Systolic and Diastolic Heart Failure   HPI:  75 y/o male w/ chronic combined systolic and diastolic HF 2/2 NICM, recent [facility] 9/21 showed no significant coronary disease. Most recent 2D echo 7/21 showed LVEF 30-35%. RV mildly reduced. Also concern for moderate to severe MR. AV also noted to be severely calcified. LVOT/AV VTI ratio: 0.23 by TTE. This was followed by TEE 8/21 which showed only moderate MR and moderate TR. AV was noted to be severely thickened/heavily calcified but no stenosis severity rating.  Also w/ h/o atrial flutter and stage III CKD (baseline SCr ~2), T2DM, HTN and HLD.   Had recent [facility] 9/21 that showed moderately elevated filling pressures and moderately reduced CO. CI was 1.8. Severity of aortic stenosis also assessed by cath. Findings c/w moderate aortic stenosis (mean gradient 21 mmHg, AVA 1.3 cm^2).  Had recent admit 12/21 for acute hypoxic respiratory failure 2/2 PNA and CHF. Also w/ symptomatic  cholelithiasis. He was diuresed w/ IV lasix but then developed AKI, felt over diuresed. SCr bump to 2.6 (baseline 1.5-1.8). Also w/ low BP. PO diuretics ultimately restarted but at lower dose. Torsemide reduced from 40 mg bid to once daily. Spironolactone and metolazone also discontinued. His discharge wt was 227 lb.   Post d/c, he went back to the ED on 12/26 for increased dyspnea and LEE and found to be back in a/c CHF. BNP was >2,000. BMP showed improved SCr, down to 1.65. not felt to require admission. Torsemide was increased back up to 40 mg bid and referred back to general cardiology. He has been limited medical therapy due to CKD and borderline hypotension. Only on low dose Coreg and torsemide. Has been referred to [facility] for further management.   He presents w/ his wife. In wheel  chair. Markedly fluid overloaded w/ 2-3+ pitting edema and JVD elevated to ear. Wt up over 30 lb from recent hospital wt from 227>>260 lb. NYHA Class III w/ 3 pillow orthopnea. BP soft, 98/76, which he reports is his baseline. Denies orthostatic symptoms. Recent device interrogation reviewed. He is in chronic Afib and appears to be pacing 42% of the time. He is rate controlled. Dr. Tanna Furry last progress note mentioned consideration for upgrade to Bi-V pacer.       2D Echo 7/21 1. Left ventricular ejection fraction, by estimation, is 30 to 35%. The  left ventricle has moderately decreased function. The left ventricle  demonstrates global hypokinesis with abnormal septal motion possibly  consistent with pacing. The left  ventricular internal cavity size was mildly dilated. Left ventricular  diastolic parameters are indeterminate.  2. Right ventricular systolic function is mildly reduced. The right  ventricular size is normal. There is mildly elevated pulmonary artery  systolic pressure. The estimated right ventricular systolic pressure is  76.1 mmHg.  3. Left atrial size was moderately dilated.  4. Right atrial size was moderately dilated.  5. The mitral valve is abnormal, mildly thickened and calcified. Moderate  to severe mitral valve regurgitation, eccentric and posteriorly directed.  Not clear that PISA calculations best define degree of regurgitation.  6. The aortic valve has an indeterminant number of cusps and is severely  calcified. Aortic valve mean gradient measures 15.5 mmHg. Cannot exclude  severe low gradient aortic stenosis with dimentionless index of 0.23.  7. The inferior vena  cava is dilated in size with <50% respiratory  variability, suggesting right atrial pressure of 15 mmHg.    TEE 8/21 1. Left ventricular ejection fraction, by estimation, is 35 to 40%. The  left ventricle has moderately decreased function. The left ventricle  demonstrates global hypokinesis.   2. Right ventricular systolic function is mildly reduced. The right  ventricular size is mildly enlarged.  3. Left atrial size was moderately dilated. No left atrial/left atrial  appendage thrombus was detected. The LAA emptying velocity was 30 cm/s.  4. Right atrial size was moderately dilated.  5. The mitral valve is abnormal. Moderate mitral valve regurgitation. No  evidence of mitral stenosis.  6. Tricuspid valve regurgitation is moderate.  7. The aortic valve is abnormal. Aortic valve regurgitation is not  visualized.   R/LHC 11/2019 Conclusions: 1. No angiographically significant coronary artery disease. 2. Mildly to moderately elevated left heart filling pressures (PCWP 22 mmHg, LVEDP 25-30 mmHg).  Prominent V waves noted on PCWP tracing consistent with significant mitral regurgitation. 3. Severely elevated right heart filling pressures (mean RA and RVEDP 16 mmHg). 4. Moderately reduced Fick cardiac output/index. 5. Moderate aortic stenosis (mean gradient 21 mmHg, AVA 1.3 cm^2).  Recommendations: 1. Continue optimization of evidence-based heart failure as well as further diuresis. 2. Ongoing management of non-ischemic cardiomyopathy and valvular heart disease per Dr. Harl Bowie. 3. Primary prevention of coronary artery disease.  Nelva Bush, MD [facility]    Review of Systems: [y] = yes, [ ]  = no   General: Weight gain [Y ]; Weight loss [ ] ; Anorexia [ ] ; Fatigue Y ]; Fever [ ] ; Chills [ ] ; Weakness [ ]   Cardiac: Chest pain/pressure [ ] ; Resting SOB [ ] ; Exertional SOB [Y ]; Orthopnea [Y ]; Pedal Edema [ Y]; Palpitations [ ] ; Syncope [ ] ; Presyncope [ ] ; Paroxysmal nocturnal dyspnea[ Y]  Pulmonary: Cough [ ] ; Wheezing[ ] ; Hemoptysis[ ] ; Sputum [ ] ; Snoring [ ]   GI: Vomiting[ ] ; Dysphagia[ ] ; Melena[ ] ; Hematochezia [ ] ; Heartburn[ ] ; Abdominal pain [ ] ; Constipation [ ] ; Diarrhea [ ] ; BRBPR [ ]   GU: Hematuria[ ] ; Dysuria [ ] ; Nocturia[ ]   Vascular: Pain in  legs with walking [ ] ; Pain in feet with lying flat [ ] ; Non-healing sores [ ] ; Stroke [ ] ; TIA [ ] ; Slurred speech [ ] ;  Neuro: Headaches[ ] ; Vertigo[ ] ; Seizures[ ] ; Paresthesias[ ] ;Blurred vision [ ] ; Diplopia [ ] ; Vision changes [ ]   Ortho/Skin: Arthritis [ ] ; Joint pain [ ] ; Muscle pain [ ] ; Joint swelling [ ] ; Back Pain [ ] ; Rash [ ]   Psych: Depression[ ] ; Anxiety[ ]   Heme: Bleeding problems [ ] ; Clotting disorders [ ] ; Anemia [ ]   Endocrine: Diabetes [Y ]; Thyroid dysfunction[ ]    Past Medical History:  Diagnosis Date  . Atrial flutter ([place]) 12/2010   Admitted with symptomatic bradycardia (HR 40s), atrial flutter with slow ventricular response 12/2010 + volume overload; AV nodal agents d/c'd and Pradaxa started; RFA in 01/2011  . CHF (congestive heart failure) ([place])   . Chronic combined systolic and diastolic heart failure ([place])    a. echo 01/06/11: mild LVH, EF 65-70%, mod to severe LAE, mild RVE, mild RAE, PASP 32;   TEE 10/12: EF 45-50% b. EF 45 to 50% by echo in 07/2018  . Class 2 severe obesity due to excess calories with serious comorbidity and body mass index (BMI) of 37.0 to 37.9 in adult ([place]) 07/17/2017  . Diabetes mellitus  non insulin dependant  . Hyperlipidemia   . Hypertension   . Osteoarthritis   . Presence of permanent cardiac pacemaker   . PSVT (paroxysmal supraventricular tachycardia) ([facility])    Possibly atrial flutter    Current Outpatient Medications  Medication Sig Dispense Refill  . ACCU-CHEK AVIVA PLUS test strip TEST BLOOD SUGAR TWICE DAILY BEFORE BREAKFAST AND AT BEDTIME 200 strip 1  . acetaminophen (TYLENOL) 500 MG tablet Take 500 mg by mouth every 6 (six) hours as needed.    Marland Kitchen apixaban (ELIQUIS) 5 MG TABS tablet Take 1 tablet (5 mg total) by mouth 2 (two) times daily. 180 tablet 3  . benzonatate (TESSALON) 100 MG capsule Take 1 capsule (100 mg total) by mouth 3 (three) times daily as needed for cough. 90 capsule 3  . HYDROcodone-acetaminophen  (NORCO/VICODIN) 5-325 MG tablet Take one tablet po every 4 hrs prn pain (Patient taking differently: Take 1 tablet by mouth every 4 (four) hours as needed for moderate pain.) 28 tablet 0  . hydrocortisone 2.5 % cream Apply BID to affected area (Patient taking differently: Apply 1 application topically 2 (two) times daily as needed (skin irritation.).) 60 g 3  . insulin degludec (TRESIBA FLEXTOUCH) 200 UNIT/ML FlexTouch Pen Inject 10 Units into the skin at bedtime. 30 mL 1  . Insulin Pen Needle (BD PEN NEEDLE NANO U/F) 32G X 4 MM MISC 1 each by Does not apply route 4 (four) times daily. 150 each 5  . Multiple Vitamin (MULTIVITAMIN WITH MINERALS) TABS tablet Take 1 tablet by mouth daily. One A Day for Men    . potassium chloride SA (KLOR-CON) 20 MEQ tablet Take 1.5 tablets (30 mEq total) by mouth daily. Take 60 meq in the am, at lunch, and in the evening 270 tablet 6  . torsemide (DEMADEX) 20 MG tablet Take 2 tablets (40 mg total) by mouth daily. TAKE 2 TABLETS (40 MG TOTAL) BY MOUTH 2 (TWO) TIMES DAILY. 60 tablet 1  . TRULICITY 1.5 GO/1.1XB SOPN INJECT 1.5MG (1 PEN) SUBCUTANEOUSLY EVERY WEEK (Patient taking differently: Inject 1.5 mg into the skin every 7 (seven) days.) 6 mL 0  . Zinc 50 MG TABS Take 50 mg by mouth daily.     No current facility-administered medications for this encounter.    No Known Allergies    Social History   Socioeconomic History  . Marital status: Married    Spouse name: Not on file  . Number of children: Not on file  . Years of education: Not on file  . Highest education level: Not on file  Occupational History  . Occupation: Immunologist: RETIRED  Tobacco Use  . Smoking status: Former Smoker    Packs/day: 1.00    Years: 10.00    Pack years: 10.00    Types: Cigarettes    Quit date: 03/26/1986    Years since quitting: 34.0  . Smokeless tobacco: Never Used  . Tobacco comment: "stopped cigarette  smoking 1988"  Vaping Use  . Vaping Use:  Never used  Substance and Sexual Activity  . Alcohol use: Not Currently    Alcohol/week: 0.0 standard drinks    Comment: "quit alcohol ~ 2007"  . Drug use: No  . Sexual activity: Yes    Partners: Female  Other Topics Concern  . Not on file  Social History Narrative  . Not on file   Social Determinants of Health   Financial Resource Strain: Not on file  Food Insecurity: Not  on file  Transportation Needs: Not on file  Physical Activity: Not on file  Stress: Not on file  Social Connections: Not on file  Intimate Partner Violence: Not on file      Family History  Problem Relation Age of Onset  . Heart attack Mother   . Hypertension Mother   . Heart attack Father   . Hypertension Father   . Heart attack Brother   . Colon cancer Neg Hx     Vitals:   03/29/20 1008  BP: 98/76  Pulse: 71  SpO2: 98%  Weight: 118.1 kg (260 lb 6.4 oz)    PHYSICAL EXAM: General:  Well appearing male, in wheel chair. No respiratory difficulty HEENT: normal Neck: supple. JVD elevated to ear. Carotids 2+ bilat; no bruits. No lymphadenopathy or thryomegaly appreciated. Cor: PMI nondisplaced. Irregularly irregular rhythm. 2/6 AS murmur RUSB and MR murmur at apex Lungs: faint bibasilar crackles, R>L  Abdomen: obese, edematous, soft, nontender, nondistended. No hepatosplenomegaly. No bruits or masses. Good bowel sounds. Extremities: no cyanosis, clubbing, rash, 3+ bilateral pitting edema up to knees, legs are warm   Neuro: alert & oriented x 3, cranial nerves grossly intact. moves all 4 extremities w/o difficulty. Affect pleasant.  ECG: V-paced 68 bpm 12/27 EKG reviewed    ASSESSMENT & PLAN:  1. Acute on Chronic Combined Systolic and Diastolic Heart Failure/NICM: - Echo 08/2015 EF 50-55% (PPM placed in 08/2015)  - Echo 10/2015 EF dropped to 40-45% - Echo 2018 EF 45% - Echo 2021 EF 30-35%, RV mildly reduced  - based on timing of drop in EF and 42% pacing percentage, concern for RV paced CM.  Aortic valve also appears severely calcified and moderate-severely stenosed, suspect AS also contributing to CM. Had recent [facility] 9/21 that showed no coronary disease.  - Will refer back to Dr. Lovena Le for potential up-grade to Bi-V pacer - Refer to Structural Heart Team for potential TAVR consideration  - He is markedly fluid overloaded today w/ 3+ bilateral pitting edema and elevated JVD to ear w/ NYHA Class III symptoms - Refer to [facility] for IV Lasix. Recommend 80 mg IV Lasix BID + 2.5 mg of metolazone daily x 3 days, w/ close monitoring of renal function and BP - if he fails outpatient diuretic regimen, will need admission to hospital for further management  - will stop Coreg given low BP  - Will ask [facility] to apply unna boots   2. Mitral Regurgitation  - Moderate by TEE 8/21  - likely functional from dilated CM   3. Aortic Stenosis  - [facility] 9/21 showed moderate AS. mean gradient 21 mmHg, AVA 1.3 cm^2 - reviewed most recent 2D echo. AV appears very calcified and stenotic. Suspect contributing to systolic HF. - refer to structural heart clinic for TAVR evaluation   4. Atrial Flutter/ Chronic Atrial Fibrillation - s/p AFL ablation  - in chronic Afib, rate controlled  - continue Eliquis - stop  blocker to allow more BP room to push aggressive diuresis w/ IV Lasix   5. Stage IIIb CKD - followed by nephrology  - baseline SCr ~1.5-1.8, most recent BMP 12/26 SCr was 1.6 - check BMP today, [facility] to follow daily BMPs while diuresing w/ IV Lasix   6. HTN - BP soft, 98/76 - hold Coreg for now to allow BP room for aggressive diuresis w/ IV Lasix   7. T2DM - on insulin - followed by PCP  - would benefit from SGLT2i  8. H/o CHB  - s/p PPM followed by Dr. Lovena Le  - ? RV paced CM. He is pacing 42%. Had drop in EF 2 months after pacer was placed  - refer back to Dr. Lovena Le for consideration for up-grade to BiV pacer   Pt also seen and examined by Dr. Haroldine Laws. Plan  discussed in detail w/ MD.   F/u w/ APP in 1-2 weeks to reassess volume status and again w/ Dr. Haroldine Laws in 4 weeks   Lyda Jester PA-C   Patient seen and examined with the above-signed Advanced Practice Provider and/or Housestaff. I personally reviewed laboratory data, imaging studies and relevant notes. I independently examined the patient and formulated the important aspects of the plan. I have edited the note to reflect any of my changes or salient points. I have personally discussed the plan with the patient and/or family.  Very difficult situation.   75 y/o male with advanced HF symptoms (NYHA IIIb-IV) with marked volume overload (30+ pounds) in setting of moderate to severe AS, NICM with EF 35-40%, chronic AF/CHB with high degree of RV pacing (>50%) and CKD 3b.  Has has marked functional deterioration over last few months. Now can barely get around the house. Wife is here with him today and clearly very concerned.   General:  Sitting in [place]. No resp difficulty HEENT: normal Neck: supple. JVP to eaer Carotids 2+ bilat; + bruits. No lymphadenopathy or thryomegaly appreciated. Cor: PMI nondisplaced. Regular rate & rhythm. 3/6 AS with markedly reduced s2 Lungs: decreased at bases Abdomen: soft, nontender, ++ distended. No hepatosplenomegaly. No bruits or masses. Good bowel sounds. Extremities: no cyanosis, clubbing, rash, 3-4+ edema into thighs Neuro: alert & orientedx3, cranial nerves grossly intact. moves all 4 extremities w/o difficulty. Affect pleasant  I am very concerned about him. His HF is likely multifactorial but I suspect AS and RV pacing playing a significant role as well as CKD. First thing we need to do is to get him decongested. Will enroll him with [facility] for IV diuresis. If not responding will need readmission. Will arrange visit with structural heart team to assess for possible TAVR (cath with moderate AS but echo appears more severe) and will also have him  f/u with Dr. Lovena Le to re-discuss BiV upgrade.   Will follow closely.  Mr. Pulis is very realistic about his situation but would like to be aggressive up front as he has "grandkids he would like to see grow up."   Total time spent 45 minutes. Over half that time spent discussing above.   Glori Bickers, MD  10:51 PM

## 2020-03-29 ENCOUNTER — Ambulatory Visit (HOSPITAL_COMMUNITY)
Admission: RE | Admit: 2020-03-29 | Discharge: 2020-03-29 | Disposition: A | Discharge status: Home / Self Care | Payer: Medicare HMO | Source: Ambulatory Visit | Attending: Cardiology | Admitting: Cardiology

## 2020-03-29 ENCOUNTER — Encounter (HOSPITAL_COMMUNITY): Payer: Self-pay

## 2020-03-29 ENCOUNTER — Other Ambulatory Visit: Payer: Self-pay

## 2020-03-29 VITALS — BP 98/76 | HR 71 | Wt 260.4 lb

## 2020-03-29 DIAGNOSIS — I0981 Rheumatic heart failure: Secondary | ICD-10-CM | POA: Diagnosis not present

## 2020-03-29 DIAGNOSIS — I5042 Chronic combined systolic (congestive) and diastolic (congestive) heart failure: Secondary | ICD-10-CM | POA: Diagnosis not present

## 2020-03-29 DIAGNOSIS — I5043 Acute on chronic combined systolic (congestive) and diastolic (congestive) heart failure: Secondary | ICD-10-CM | POA: Diagnosis not present

## 2020-03-29 DIAGNOSIS — Z950 Presence of cardiac pacemaker: Secondary | ICD-10-CM | POA: Diagnosis not present

## 2020-03-29 DIAGNOSIS — I5023 Acute on chronic systolic (congestive) heart failure: Secondary | ICD-10-CM | POA: Diagnosis not present

## 2020-03-29 DIAGNOSIS — N184 Chronic kidney disease, stage 4 (severe): Secondary | ICD-10-CM | POA: Diagnosis not present

## 2020-03-29 DIAGNOSIS — J181 Lobar pneumonia, unspecified organism: Secondary | ICD-10-CM | POA: Diagnosis not present

## 2020-03-29 DIAGNOSIS — I080 Rheumatic disorders of both mitral and aortic valves: Secondary | ICD-10-CM | POA: Insufficient documentation

## 2020-03-29 DIAGNOSIS — N1832 Chronic kidney disease, stage 3b: Secondary | ICD-10-CM | POA: Diagnosis not present

## 2020-03-29 DIAGNOSIS — Z87891 Personal history of nicotine dependence: Secondary | ICD-10-CM | POA: Insufficient documentation

## 2020-03-29 DIAGNOSIS — E1159 Type 2 diabetes mellitus with other circulatory complications: Secondary | ICD-10-CM | POA: Diagnosis not present

## 2020-03-29 DIAGNOSIS — I482 Chronic atrial fibrillation, unspecified: Secondary | ICD-10-CM | POA: Insufficient documentation

## 2020-03-29 DIAGNOSIS — Z7901 Long term (current) use of anticoagulants: Secondary | ICD-10-CM | POA: Insufficient documentation

## 2020-03-29 DIAGNOSIS — I350 Nonrheumatic aortic (valve) stenosis: Secondary | ICD-10-CM | POA: Diagnosis not present

## 2020-03-29 DIAGNOSIS — K8000 Calculus of gallbladder with acute cholecystitis without obstruction: Secondary | ICD-10-CM | POA: Diagnosis not present

## 2020-03-29 DIAGNOSIS — N183 Chronic kidney disease, stage 3 unspecified: Secondary | ICD-10-CM

## 2020-03-29 DIAGNOSIS — E1122 Type 2 diabetes mellitus with diabetic chronic kidney disease: Secondary | ICD-10-CM | POA: Diagnosis not present

## 2020-03-29 DIAGNOSIS — I4819 Other persistent atrial fibrillation: Secondary | ICD-10-CM

## 2020-03-29 DIAGNOSIS — I130 Hypertensive heart and chronic kidney disease with heart failure and stage 1 through stage 4 chronic kidney disease, or unspecified chronic kidney disease: Secondary | ICD-10-CM | POA: Diagnosis not present

## 2020-03-29 DIAGNOSIS — Z79899 Other long term (current) drug therapy: Secondary | ICD-10-CM | POA: Insufficient documentation

## 2020-03-29 DIAGNOSIS — Z794 Long term (current) use of insulin: Secondary | ICD-10-CM | POA: Diagnosis not present

## 2020-03-29 DIAGNOSIS — J9601 Acute respiratory failure with hypoxia: Secondary | ICD-10-CM | POA: Diagnosis not present

## 2020-03-29 DIAGNOSIS — I081 Rheumatic disorders of both mitral and tricuspid valves: Secondary | ICD-10-CM | POA: Diagnosis not present

## 2020-03-29 LAB — BASIC METABOLIC PANEL
Anion gap: 9 (ref 5–15)
BUN: 48 mg/dL — ABNORMAL HIGH (ref 8–23)
CO2: 24 mmol/L (ref 22–32)
Calcium: 8.5 mg/dL — ABNORMAL LOW (ref 8.9–10.3)
Chloride: 96 mmol/L — ABNORMAL LOW (ref 98–111)
Creatinine, Ser: 2.22 mg/dL — ABNORMAL HIGH (ref 0.61–1.24)
GFR, Estimated: 30 mL/min — ABNORMAL LOW (ref >60)
Glucose, Bld: 167 mg/dL — ABNORMAL HIGH (ref 70–99)
Potassium: 5.6 mmol/L — ABNORMAL HIGH (ref 3.5–5.1)
Sodium: 129 mmol/L — ABNORMAL LOW (ref 135–145)

## 2020-03-29 LAB — CBC
HCT: 39.6 % (ref 39.0–52.0)
Hemoglobin: 13.2 g/dL (ref 13.0–17.0)
MCH: 31 pg (ref 26.0–34.0)
MCHC: 33.3 g/dL (ref 30.0–36.0)
MCV: 93 fL (ref 80.0–100.0)
Platelets: 69 10*3/uL — ABNORMAL LOW (ref 150–400)
RBC: 4.26 MIL/uL (ref 4.22–5.81)
RDW: 19.5 % — ABNORMAL HIGH (ref 11.5–15.5)
WBC: 7.3 10*3/uL (ref 4.0–10.5)
nRBC: 0 % (ref 0.0–0.2)

## 2020-03-29 NOTE — Patient Instructions (Signed)
STOP Coreg  Labs today We will only contact you if something comes back abnormal or we need to make some changes. Otherwise no news is good news!  You have been referred to [facility] for IV Lasix in your home for 3 days -they will be in touch to arrange a visit  You have been referred to [facility]- electrophysiology (Dr Lovena Le) Address: [address] [place] [address] Phone: [phone]  You have been referred to [facility] (Dr Burt Knack) Address: [place] [address] Phone: [phone]   Your physician recommends that you schedule a follow-up appointment in: 4 weeks with Dr Haroldine Laws  If you have any questions or concerns before your next appointment please send [place] a message through [place] or call our office at [phone].    TO LEAVE A MESSAGE FOR THE NURSE SELECT OPTION 2, PLEASE LEAVE A MESSAGE INCLUDING: . YOUR NAME . DATE OF BIRTH . CALL BACK NUMBER . REASON FOR CALL**this is important as we prioritize the call backs  YOU WILL RECEIVE A CALL BACK THE SAME DAY AS LONG AS YOU CALL BEFORE 4:00 PM

## 2020-03-30 ENCOUNTER — Inpatient Hospital Stay: Payer: Medicare HMO | Admitting: Family Medicine

## 2020-03-30 ENCOUNTER — Institutional Professional Consult (permissible substitution): Payer: Medicare HMO | Admitting: Cardiovascular Disease

## 2020-03-30 DIAGNOSIS — Z9889 Other specified postprocedural states: Secondary | ICD-10-CM | POA: Diagnosis not present

## 2020-03-31 DIAGNOSIS — I5032 Chronic diastolic (congestive) heart failure: Secondary | ICD-10-CM | POA: Diagnosis not present

## 2020-04-01 DIAGNOSIS — J9601 Acute respiratory failure with hypoxia: Secondary | ICD-10-CM | POA: Diagnosis not present

## 2020-04-01 DIAGNOSIS — I081 Rheumatic disorders of both mitral and tricuspid valves: Secondary | ICD-10-CM | POA: Diagnosis not present

## 2020-04-01 DIAGNOSIS — K8000 Calculus of gallbladder with acute cholecystitis without obstruction: Secondary | ICD-10-CM | POA: Diagnosis not present

## 2020-04-01 DIAGNOSIS — J181 Lobar pneumonia, unspecified organism: Secondary | ICD-10-CM | POA: Diagnosis not present

## 2020-04-01 DIAGNOSIS — N184 Chronic kidney disease, stage 4 (severe): Secondary | ICD-10-CM | POA: Diagnosis not present

## 2020-04-01 DIAGNOSIS — I0981 Rheumatic heart failure: Secondary | ICD-10-CM | POA: Diagnosis not present

## 2020-04-01 DIAGNOSIS — E1122 Type 2 diabetes mellitus with diabetic chronic kidney disease: Secondary | ICD-10-CM | POA: Diagnosis not present

## 2020-04-01 DIAGNOSIS — I5042 Chronic combined systolic (congestive) and diastolic (congestive) heart failure: Secondary | ICD-10-CM | POA: Diagnosis not present

## 2020-04-01 DIAGNOSIS — I130 Hypertensive heart and chronic kidney disease with heart failure and stage 1 through stage 4 chronic kidney disease, or unspecified chronic kidney disease: Secondary | ICD-10-CM | POA: Diagnosis not present

## 2020-04-01 DIAGNOSIS — I5032 Chronic diastolic (congestive) heart failure: Secondary | ICD-10-CM | POA: Diagnosis not present

## 2020-04-04 ENCOUNTER — Ambulatory Visit (INDEPENDENT_AMBULATORY_CARE_PROVIDER_SITE_OTHER): Payer: Medicare HMO | Admitting: Cardiovascular Disease

## 2020-04-04 ENCOUNTER — Encounter: Payer: Self-pay | Admitting: Cardiovascular Disease

## 2020-04-04 ENCOUNTER — Other Ambulatory Visit: Payer: Self-pay

## 2020-04-04 VITALS — BP 100/70 | HR 69 | Ht 72.0 in | Wt 248.0 lb

## 2020-04-04 DIAGNOSIS — I350 Nonrheumatic aortic (valve) stenosis: Secondary | ICD-10-CM

## 2020-04-04 DIAGNOSIS — Z01812 Encounter for preprocedural laboratory examination: Secondary | ICD-10-CM | POA: Diagnosis not present

## 2020-04-04 LAB — BASIC METABOLIC PANEL
BUN/Creatinine Ratio: 19 (ref 10–24)
BUN: 29 mg/dL — ABNORMAL HIGH (ref 8–27)
CO2: 27 mmol/L (ref 20–29)
Calcium: 9 mg/dL (ref 8.6–10.2)
Chloride: 93 mmol/L — ABNORMAL LOW (ref 96–106)
Creatinine, Ser: 1.5 mg/dL — ABNORMAL HIGH (ref 0.76–1.27)
GFR calc Af Amer: 52 mL/min/{1.73_m2} — ABNORMAL LOW (ref >59)
GFR calc non Af Amer: 45 mL/min/{1.73_m2} — ABNORMAL LOW (ref >59)
Glucose: 159 mg/dL — ABNORMAL HIGH (ref 65–99)
Potassium: 3.2 mmol/L — ABNORMAL LOW (ref 3.5–5.2)
Sodium: 136 mmol/L (ref 134–144)

## 2020-04-04 NOTE — Patient Instructions (Signed)
Medication Instructions:  No changes *If you need a refill on your cardiac medications before your next appointment, please call your pharmacy*   Lab Work: BMET today   Testing/Procedures: None   Other Instructions Theodosia Quay, RN Structural Heart Nurse Navigator will contact you regarding next steps.

## 2020-04-04 NOTE — Progress Notes (Signed)
Structural Heart Clinic Consult Note  Chief Complaint  Patient presents with  . New Patient (Initial Visit)    Severe aortic stenosis   History of Present Illness: 75 yo male with history of atrial flutter s/p ablation, persistent atrial fibrillation on Eliquis, symptomatic bradycardia post permanent pacemaker placement, chronic combined systolic and diastolic CHF, non-ischemic cardiomyopathy, mitral regurgitation, CKD stage 3, diabetes mellitus, HTN, hyperlipidemia and severe aortic stenosis who is today as a new consult, referred by Dr. Haroldine Laws, for further discussion of his aortic stenosis and possible TAVR. He is known to have a non-ischemic cardiomyopathy. Cardiac cath September 2021 with no evidence of CAD. Moderately elevated filling pressures by cath. Mean gradient across the aortic valve was 21 mmHg. He has chronic atrial fibrillation and is on Eliquis. Echo July 2021 with LVEF=30-35%, moderate MR and severe low flow/low gradient aortic stenosis with mean gradient 15.5 mmHg, dimensionless index 0.23, AVA 0.86 cm2. TEE August 2021 with LVEF=30-35% with global hypokinesis, moderate mitral regurgitation. The aortic valve was noted to be thickened and calcified with limited leaflet mobility.  He was admitted 03/09/20 with acute hypoxic respiratory failure secondary to pneumonia and CHF and was diuresed with IV Lasix. He was overdiuresed and developed acute kidney injury. He was seen in the ED 03/20/20 with worsened lower extremity edema and CHF and was discharged home on higher dose of Torsemide. He was seen in the Advanced Heart Failure clinic 03/29/20 by Dr. Haroldine Laws and was noted to be markedly volume overloaded. He was referred to [facility] for IV Lasix with use of metolazone.   He tells me today that he has had progressive dyspnea on exertion with fluid retention, lower extremity edema and weight gain. He has seen a dramatic improvement in symptoms and LE edema since being on home IV Lasix  infusion over the past week. No chest pain or dizziness. He has some fatigue. He is retired from the [employer]. He lives with his wife in [place], [place]. He goes to the dentist regularly. He has partial implants. No active dental issues.   Primary Care Physician: Erven Colla, DO Primary Cardiologist: Dr. Harl Bowie Referring Cardiologist: Dr. Haroldine Laws  Past Medical History:  Diagnosis Date  . Atrial flutter ([place]) 12/2010   Admitted with symptomatic bradycardia (HR 40s), atrial flutter with slow ventricular response 12/2010 + volume overload; AV nodal agents d/c'd and Pradaxa started; RFA in 01/2011  . CHF (congestive heart failure) ([place])   . Chronic combined systolic and diastolic heart failure ([place])    a. echo 01/06/11: mild LVH, EF 65-70%, mod to severe LAE, mild RVE, mild RAE, PASP 32;   TEE 10/12: EF 45-50% b. EF 45 to 50% by echo in 07/2018  . Class 2 severe obesity due to excess calories with serious comorbidity and body mass index (BMI) of 37.0 to 37.9 in adult ([place]) 07/17/2017  . Diabetes mellitus    non insulin dependant  . Hyperlipidemia   . Hypertension   . Osteoarthritis   . Presence of permanent cardiac pacemaker   . PSVT (paroxysmal supraventricular tachycardia) (HCC)    Possibly atrial flutter    Past Surgical History:  Procedure Laterality Date  . ATRIAL FLUTTER ABLATION N/A 02/07/2011   Procedure: ATRIAL FLUTTER ABLATION;  Surgeon: Evans Lance, MD;  Location: [facility] CATH LAB;  Service: Cardiovascular;  Laterality: N/A;  . CARDIAC ELECTROPHYSIOLOGY STUDY AND ABLATION  02/07/11  . CARDIOVERSION N/A 03/23/2016   Procedure: CARDIOVERSION;  Surgeon: Evans Lance, MD;  Location: [facility] ENDOSCOPY;  Service: Cardiovascular;  Laterality: N/A;  . COLONOSCOPY N/A 04/17/2017   Procedure: COLONOSCOPY;  Surgeon: Rogene Houston, MD;  Location: [facility] ENDO SUITE;  Service: Endoscopy;  Laterality: N/A;  830  . EP IMPLANTABLE DEVICE N/A 08/29/2015   Procedure: Pacemaker  Implant;  Surgeon: Evans Lance, MD;  Location: [place] CV LAB;  Service: Cardiovascular;  Laterality: N/A;  . EP IMPLANTABLE DEVICE N/A 12/07/2015   Procedure: PPM Lead Revision/Repair;  Surgeon: Evans Lance, MD;  Location: [place] CV LAB;  Service: Cardiovascular;  Laterality: N/A;  . KNEE ARTHROSCOPY  2005   left  . LUMBAR SPINE SURGERY     "I've had 6 ORs 1972 thru 2004"  . POLYPECTOMY  04/17/2017   Procedure: POLYPECTOMY;  Surgeon: Rogene Houston, MD;  [facility] ENDO [facility];  Service: Endoscopy;;  transverse colon x3;  . RIGHT/LEFT HEART CATH AND CORONARY ANGIOGRAPHY N/A 12/21/2019   Procedure: RIGHT/LEFT HEART CATH AND CORONARY ANGIOGRAPHY;  Surgeon: Nelva Bush, MD;  Location: [place] CV LAB;  Service: Cardiovascular;  Laterality: N/A;  . TEE WITHOUT CARDIOVERSION N/A 11/05/2019   Procedure: TRANSESOPHAGEAL ECHOCARDIOGRAM (TEE) WITH PROPOFOL;  Surgeon: Arnoldo Lenis, MD;  [facility];  Service: Endoscopy;  Laterality: N/A;    Current Outpatient Medications  Medication Sig Dispense Refill  . ACCU-CHEK AVIVA PLUS test strip TEST BLOOD SUGAR TWICE DAILY BEFORE BREAKFAST AND AT BEDTIME 200 strip 1  . acetaminophen (TYLENOL) 500 MG tablet Take 500 mg by mouth every 6 (six) hours as needed.    Marland Kitchen apixaban (ELIQUIS) 5 MG TABS tablet Take 1 tablet (5 mg total) by mouth 2 (two) times daily. 180 tablet 3  . benzonatate (TESSALON) 100 MG capsule Take 1 capsule (100 mg total) by mouth 3 (three) times daily as needed for cough. 90 capsule 3  . HYDROcodone-acetaminophen (NORCO/VICODIN) 5-325 MG tablet Take one tablet po every 4 hrs prn pain (Patient taking differently: Take 1 tablet by mouth every 4 (four) hours as needed for moderate pain.) 28 tablet 0  . hydrocortisone 2.5 % cream Apply BID to affected area (Patient taking differently: Apply 1 application topically 2 (two) times daily as needed (skin irritation.).) 60 g 3  . insulin degludec (TRESIBA FLEXTOUCH)  200 UNIT/ML FlexTouch Pen Inject 10 Units into the skin at bedtime. 30 mL 1  . Insulin Pen Needle (BD PEN NEEDLE NANO U/F) 32G X 4 MM MISC 1 each by Does not apply route 4 (four) times daily. 150 each 5  . Multiple Vitamin (MULTIVITAMIN WITH MINERALS) TABS tablet Take 1 tablet by mouth daily. One A Day for Men    . potassium chloride SA (KLOR-CON) 20 MEQ tablet Take 40 mEq by mouth 2 (two) times daily.    Marland Kitchen torsemide (DEMADEX) 20 MG tablet Take 2 tablets (40 mg total) by mouth daily. TAKE 2 TABLETS (40 MG TOTAL) BY MOUTH 2 (TWO) TIMES DAILY. 60 tablet 1  . TRULICITY 1.5 OI/7.8MV SOPN INJECT 1.5MG (1 PEN) SUBCUTANEOUSLY EVERY WEEK (Patient taking differently: Inject 1.5 mg into the skin every 7 (seven) days.) 6 mL 0  . Zinc 50 MG TABS Take 50 mg by mouth daily.     No current facility-administered medications for this visit.    No Known Allergies  Social History   Socioeconomic History  . Marital status: Married    Spouse name: Not on file  . Number of children: 2  . Years of education: Not on file  .  Highest education level: Not on file  Occupational History  . Occupation: Immunologist: RETIRED  Tobacco Use  . Smoking status: Former Smoker    Packs/day: 1.00    Years: 10.00    Pack years: 10.00    Types: Cigarettes    Quit date: 03/26/1986    Years since quitting: 34.0  . Smokeless tobacco: Never Used  . Tobacco comment: "stopped cigarette  smoking 1988"  Vaping Use  . Vaping Use: Never used  Substance and Sexual Activity  . Alcohol use: Not Currently    Alcohol/week: 0.0 standard drinks    Comment: "quit alcohol ~ 2007"  . Drug use: No  . Sexual activity: Yes    Partners: Female  Other Topics Concern  . Not on file  Social History Narrative  . Not on file   Social Determinants of Health   Financial Resource Strain: Not on file  Food Insecurity: Not on file  Transportation Needs: Not on file  Physical Activity: Not on file  Stress: Not on file   Social Connections: Not on file  Intimate Partner Violence: Not on file    Family History  Problem Relation Age of Onset  . Heart attack Mother   . Hypertension Mother   . Heart attack Father   . Hypertension Father   . Heart attack Brother   . Colon cancer Neg Hx     Review of Systems:  As stated in the HPI and otherwise negative.   BP 100/70   Pulse 69   Ht 6' (1.829 m)   Wt 248 lb (112.5 kg)   SpO2 99%   BMI 33.63 kg/m   Physical Examination: General: Obese male in NAD.  HEENT: OP clear, mucus membranes moist  SKIN: warm, dry. No rashes. Neuro: No focal deficits  Musculoskeletal: Muscle strength 5/5 all ext  Psychiatric: Mood and affect normal  Neck: No JVD, no carotid bruits, no thyromegaly, no lymphadenopathy.  Lungs:Clear bilaterally, no wheezes, rhonci, crackles Cardiovascular: RRR, Loud, harsh, late peaking systolic murmur.  Abdomen:Soft. Bowel sounds present. Non-tender.  Extremities: 2+ bilateral lower extremity edema.   EKG:  EKG is not ordered today. The ekg from 03/21/20 shows sinus with ventricular pacing.   Echo 10/14/19: 1. Left ventricular ejection fraction, by estimation, is 30 to 35%. The  left ventricle has moderately decreased function. The left ventricle  demonstrates global hypokinesis with abnormal septal motion possibly  consistent with pacing. The left  ventricular internal cavity size was mildly dilated. Left ventricular  diastolic parameters are indeterminate.  2. Right ventricular systolic function is mildly reduced. The right  ventricular size is normal. There is mildly elevated pulmonary artery  systolic pressure. The estimated right ventricular systolic pressure is  36.4 mmHg.  3. Left atrial size was moderately dilated.  4. Right atrial size was moderately dilated.  5. The mitral valve is abnormal, mildly thickened and calcified. Moderate  to severe mitral valve regurgitation, eccentric and posteriorly directed.  Not clear  that PISA calculations best define degree of regurgitation.  6. The aortic valve has an indeterminant number of cusps and is severely  calcified. Aortic valve mean gradient measures 15.5 mmHg. Cannot exclude  severe low gradient aortic stenosis with dimentionless index of 0.23.  7. The inferior vena cava is dilated in size with <50% respiratory  variability, suggesting right atrial pressure of 15 mmHg.   FINDINGS  Left Ventricle: Left ventricular ejection fraction, by estimation, is 30  to 35%. The  left ventricle has moderately decreased function. The left  ventricle demonstrates global hypokinesis. The left ventricular internal  cavity size was mildly dilated.  There is no left ventricular hypertrophy. Abnormal (paradoxical) septal  motion, consistent with RV pacemaker. Left ventricular diastolic  parameters are indeterminate.   Right Ventricle: The right ventricular size is normal. No increase in  right ventricular wall thickness. Right ventricular systolic function is  mildly reduced. There is mildly elevated pulmonary artery systolic  pressure. The tricuspid regurgitant velocity  is 2.56 m/s, and with an assumed right atrial pressure of 15 mmHg, the  estimated right ventricular systolic pressure is 29.5 mmHg.   Left Atrium: Left atrial size was moderately dilated.   Right Atrium: Right atrial size was moderately dilated.   Pericardium: There is no evidence of pericardial effusion.   Mitral Valve: The mitral valve is abnormal. There is mild thickening of  the mitral valve leaflet(s). There is mild calcification of the mitral  valve leaflet(s). Moderate to severe mitral valve regurgitation, with  eccentric posteriorly directed jet.   Tricuspid Valve: The tricuspid valve is grossly normal. Tricuspid valve  regurgitation is mild.   Aortic Valve: The aortic valve has an indeterminant number of cusps.  Aortic valve regurgitation is not visualized. There is severe calcifcation   of the aortic valve. Aortic valve mean gradient measures 15.5 mmHg. Aortic  valve peak gradient measures 26.0  mmHg. Aortic valve area, by VTI measures 0.86 cm.   Pulmonic Valve: The pulmonic valve was grossly normal. Pulmonic valve  regurgitation is trivial.   Aorta: The aortic root is normal in size and structure.   Venous: The inferior vena cava is dilated in size with less than 50%  respiratory variability, suggesting right atrial pressure of 15 mmHg.   IAS/Shunts: No atrial level shunt detected by color flow Doppler.   Additional Comments: A pacer wire is visualized.     LEFT VENTRICLE  PLAX 2D  LVIDd:     6.02 cm   Diastology  LVIDs:     4.08 cm   LV e' lateral:  12.50 cm/s  LV PW:     0.90 cm   LV E/e' lateral: 10.4  LV IVS:    0.65 cm   LV e' medial:  6.00 cm/s  LVOT diam:   2.20 cm   LV E/e' medial: 21.7  LV SV:     51  LV SV Index:  22  LVOT Area:   3.80 cm    LV Volumes (MOD)  LV vol d, MOD A2C: 168.5 ml  LV vol d, MOD A4C: 149.7 ml  LV vol s, MOD A2C: 89.2 ml  LV vol s, MOD A4C: 82.0 ml  LV SV MOD A2C:   79.3 ml  LV SV MOD A4C:   149.7 ml  LV SV MOD BP:   73.9 ml   RIGHT VENTRICLE  RV S prime:   10.30 cm/s  TAPSE (M-mode): 1.8 cm   LEFT ATRIUM      Index  LA diam:   3.30 cm 1.43 cm/m  LA Vol (A2C): 100.0 ml 43.27 ml/m  LA Vol (A4C): 94.0 ml 40.67 ml/m  AORTIC VALVE  AV Area (Vmax):  1.02 cm  AV Area (Vmean):  0.94 cm  AV Area (VTI):   0.86 cm  AV Vmax:      255.00 cm/s  AV Vmean:     188.500 cm/s  AV VTI:      0.597 m  AV Peak Grad:  26.0 mmHg  AV Mean Grad:   15.5 mmHg  LVOT Vmax:     68.50 cm/s  LVOT Vmean:    46.600 cm/s  LVOT VTI:     0.135 m  LVOT/AV VTI ratio: 0.23    AORTA  Ao Root diam: 3.00 cm   MITRAL VALVE         TRICUSPID VALVE  MV Area (PHT):        TR Peak grad:  26.2 mmHg  MV Decel Time: 211  msec   TR Vmax:    256.00 cm/s  MR Peak grad:  81.5 mmHg  MR Mean grad:  45.0 mmHg  SHUNTS  MR Vmax:     451.25 cm/s Systemic VTI: 0.14 m  MR Vmean:    308.0 cm/s Systemic Diam: 2.20 cm  MR PISA Eff ROA: 5 mm  MV E velocity: 130.00 cm/s  MV A velocity: 18.40 cm/s  MV E/A ratio: 7.07   Recent Labs: 03/12/2020: ALT 20 03/14/2020: Magnesium 2.7 03/20/2020: B Natriuretic Peptide 2,072.0 03/29/2020: BUN 48; Creatinine, Ser 2.22; Hemoglobin 13.2; Platelets 69; Potassium 5.6; Sodium 129   Lipid Panel    Component Value Date/Time   CHOL 178 08/12/2019 0809   CHOL 142 01/17/2018 0835   TRIG 100 08/12/2019 0809   HDL 51 08/12/2019 0809   HDL 53 01/17/2018 0835   CHOLHDL 3.5 08/12/2019 0809   VLDL 20 08/12/2019 0809   LDLCALC 107 (H) 08/12/2019 0809   LDLCALC 71 01/17/2018 0835     Wt Readings from Last 3 Encounters:  04/04/20 248 lb (112.5 kg)  03/29/20 260 lb 6.4 oz (118.1 kg)  03/23/20 248 lb 12.8 oz (112.9 kg)     Other studies Reviewed: Additional studies/ records that were reviewed today include: echo images, cath films, office notes Review of the above records demonstrates: severe AS   Assessment and Plan:   1. Severe Aortic Valve Stenosis: He likely has severe, stage D2 aortic valve stenosis (low flow/low gradient aortic stenosis). I have personally reviewed the echo images. The aortic valve is thickened, calcified with limited leaflet mobility. I think he would benefit from AVR. Given advanced age, he is not a good candidate for conventional AVR by surgical approach. I think he may be a good candidate for TAVR.   STS Risk Score: Risk of Mortality: 1.992% Renal Failure: 10.604% Permanent Stroke: 1.717% Prolonged Ventilation: 15.129% DSW Infection: 0.199% Reoperation: 4.734% Morbidity or Mortality: 27.401% Short Length of Stay: 20.116% Long Length of Stay: 12.373%   I have reviewed the natural history of aortic stenosis with the  patient and their family members  who are present today. We have discussed the limitations of medical therapy and the poor prognosis associated with symptomatic aortic stenosis. We have reviewed potential treatment options, including palliative medical therapy, conventional surgical aortic valve replacement, and transcatheter aortic valve replacement. We discussed treatment options in the context of the patient's specific comorbid medical conditions.   He would like to proceed with planning for TAVR. Risks and benefits of the valve procedure are reviewed with the patient. I will arrange a cardiac CT, CTA of the chest/abdomen and pelvis, carotid artery dopplers, PT assessment and he will then be referred to see one of the CT surgeons on our TAVR team. BMET today. If renal function is stable, will proceed with CT scans.      Current medicines are reviewed at length with the patient today.  The patient does not have concerns regarding medicines.  The  following changes have been made:  no change  Labs/ tests ordered today include:   Orders Placed This Encounter  Procedures  . Basic metabolic panel     Disposition:   F/U with the valve team.    Signed, Lauree Chandler, MD 04/04/2020 10:11 AM    [facility] [place], [place], [place]  [address] Phone: [phone]; Fax: [phone]

## 2020-04-05 ENCOUNTER — Other Ambulatory Visit: Payer: Self-pay | Admitting: "Endocrinology

## 2020-04-05 DIAGNOSIS — I081 Rheumatic disorders of both mitral and tricuspid valves: Secondary | ICD-10-CM | POA: Diagnosis not present

## 2020-04-05 DIAGNOSIS — I0981 Rheumatic heart failure: Secondary | ICD-10-CM | POA: Diagnosis not present

## 2020-04-05 DIAGNOSIS — N184 Chronic kidney disease, stage 4 (severe): Secondary | ICD-10-CM | POA: Diagnosis not present

## 2020-04-05 DIAGNOSIS — J9601 Acute respiratory failure with hypoxia: Secondary | ICD-10-CM | POA: Diagnosis not present

## 2020-04-05 DIAGNOSIS — I130 Hypertensive heart and chronic kidney disease with heart failure and stage 1 through stage 4 chronic kidney disease, or unspecified chronic kidney disease: Secondary | ICD-10-CM | POA: Diagnosis not present

## 2020-04-05 DIAGNOSIS — K8000 Calculus of gallbladder with acute cholecystitis without obstruction: Secondary | ICD-10-CM | POA: Diagnosis not present

## 2020-04-05 DIAGNOSIS — J181 Lobar pneumonia, unspecified organism: Secondary | ICD-10-CM | POA: Diagnosis not present

## 2020-04-05 DIAGNOSIS — I5042 Chronic combined systolic (congestive) and diastolic (congestive) heart failure: Secondary | ICD-10-CM | POA: Diagnosis not present

## 2020-04-05 DIAGNOSIS — E1122 Type 2 diabetes mellitus with diabetic chronic kidney disease: Secondary | ICD-10-CM | POA: Diagnosis not present

## 2020-04-06 ENCOUNTER — Other Ambulatory Visit: Payer: Self-pay | Admitting: "Endocrinology

## 2020-04-07 DIAGNOSIS — I5032 Chronic diastolic (congestive) heart failure: Secondary | ICD-10-CM | POA: Diagnosis not present

## 2020-04-11 DIAGNOSIS — I5042 Chronic combined systolic (congestive) and diastolic (congestive) heart failure: Secondary | ICD-10-CM | POA: Diagnosis not present

## 2020-04-11 DIAGNOSIS — E1122 Type 2 diabetes mellitus with diabetic chronic kidney disease: Secondary | ICD-10-CM | POA: Diagnosis not present

## 2020-04-11 DIAGNOSIS — K8000 Calculus of gallbladder with acute cholecystitis without obstruction: Secondary | ICD-10-CM | POA: Diagnosis not present

## 2020-04-11 DIAGNOSIS — I081 Rheumatic disorders of both mitral and tricuspid valves: Secondary | ICD-10-CM | POA: Diagnosis not present

## 2020-04-11 DIAGNOSIS — N184 Chronic kidney disease, stage 4 (severe): Secondary | ICD-10-CM | POA: Diagnosis not present

## 2020-04-11 DIAGNOSIS — I130 Hypertensive heart and chronic kidney disease with heart failure and stage 1 through stage 4 chronic kidney disease, or unspecified chronic kidney disease: Secondary | ICD-10-CM | POA: Diagnosis not present

## 2020-04-11 DIAGNOSIS — J9601 Acute respiratory failure with hypoxia: Secondary | ICD-10-CM | POA: Diagnosis not present

## 2020-04-11 DIAGNOSIS — I0981 Rheumatic heart failure: Secondary | ICD-10-CM | POA: Diagnosis not present

## 2020-04-11 DIAGNOSIS — J181 Lobar pneumonia, unspecified organism: Secondary | ICD-10-CM | POA: Diagnosis not present

## 2020-04-13 ENCOUNTER — Ambulatory Visit (INDEPENDENT_AMBULATORY_CARE_PROVIDER_SITE_OTHER): Payer: Medicare HMO | Admitting: Internal Medicine

## 2020-04-13 ENCOUNTER — Encounter: Payer: Self-pay | Admitting: Internal Medicine

## 2020-04-13 ENCOUNTER — Other Ambulatory Visit: Payer: Self-pay

## 2020-04-13 VITALS — BP 118/78 | HR 82 | Ht 72.0 in | Wt 234.6 lb

## 2020-04-13 DIAGNOSIS — I5022 Chronic systolic (congestive) heart failure: Secondary | ICD-10-CM | POA: Diagnosis not present

## 2020-04-13 DIAGNOSIS — I442 Atrioventricular block, complete: Secondary | ICD-10-CM

## 2020-04-13 DIAGNOSIS — I5032 Chronic diastolic (congestive) heart failure: Secondary | ICD-10-CM | POA: Diagnosis not present

## 2020-04-13 LAB — CUP PACEART INCLINIC DEVICE CHECK
Brady Statistic RA Percent Paced: 16.1 %
Brady Statistic RV Percent Paced: 39.9 %
Date Time Interrogation Session: 20220119115332
Implantable Lead Implant Date: 20170605
Implantable Lead Implant Date: 20170605
Implantable Lead Location: 753859
Implantable Lead Location: 753860
Implantable Lead Model: 5076
Implantable Lead Model: 5076
Implantable Pulse Generator Implant Date: 20170605
Lead Channel Pacing Threshold Amplitude: 0.75 V
Lead Channel Pacing Threshold Pulse Width: 0.4 ms
Lead Channel Sensing Intrinsic Amplitude: 2 mV
Lead Channel Sensing Intrinsic Amplitude: 8 mV

## 2020-04-13 NOTE — Progress Notes (Addendum)
HPI Mr. Pangborn returns today for followup. He is a pleasant 75 yo man with a h/o CHB, s/p PPM insertion. He has persistent atrial fib. He underwent PPM insertion in 2017 and had an atrial lead dislodge. He has developed worsening sob and CHF and now peripheral edema. He is thought to have low output AS. He has had worsening peripheral edema. No syncope.  No Known Allergies   Current Outpatient Medications  Medication Sig Dispense Refill  . ACCU-CHEK AVIVA PLUS test strip TEST BLOOD SUGAR TWICE DAILY BEFORE BREAKFAST AND AT BEDTIME 200 strip 1  . apixaban (ELIQUIS) 5 MG TABS tablet Take 1 tablet (5 mg total) by mouth 2 (two) times daily. 180 tablet 3  . benzonatate (TESSALON) 100 MG capsule Take 1 capsule (100 mg total) by mouth 3 (three) times daily as needed for cough. 90 capsule 3  . HYDROcodone-acetaminophen (NORCO/VICODIN) 5-325 MG tablet Take one tablet po every 4 hrs prn pain (Patient taking differently: Take 1 tablet by mouth every 4 (four) hours as needed for moderate pain.) 28 tablet 0  . hydrocortisone 2.5 % cream Apply BID to affected area (Patient taking differently: Apply 1 application topically 2 (two) times daily as needed (skin irritation.).) 60 g 3  . insulin degludec (TRESIBA FLEXTOUCH) 200 UNIT/ML FlexTouch Pen Inject 50 Units into the skin at bedtime. 23 mL 0  . Insulin Pen Needle (BD PEN NEEDLE NANO U/F) 32G X 4 MM MISC 1 each by Does not apply route 4 (four) times daily. 150 each 5  . Multiple Vitamin (MULTIVITAMIN WITH MINERALS) TABS tablet Take 1 tablet by mouth daily. One A Day for Men    . potassium chloride SA (KLOR-CON) 20 MEQ tablet Take 40 mEq by mouth 2 (two) times daily.    Marland Kitchen torsemide (DEMADEX) 20 MG tablet Take 2 tablets (40 mg total) by mouth daily. TAKE 2 TABLETS (40 MG TOTAL) BY MOUTH 2 (TWO) TIMES DAILY. 60 tablet 1  . TRULICITY 1.5 UK/0.2RK SOPN INJECT 1.5MG (1 PEN) SUBCUTANEOUSLY EVERY WEEK 2 mL 2  . Zinc 50 MG TABS Take 50 mg by mouth daily.      No current facility-administered medications for this visit.     Past Medical History:  Diagnosis Date  . Atrial flutter ([place]) 12/2010   Admitted with symptomatic bradycardia (HR 40s), atrial flutter with slow ventricular response 12/2010 + volume overload; AV nodal agents d/c'd and Pradaxa started; RFA in 01/2011  . CHF (congestive heart failure) ([place])   . Chronic combined systolic and diastolic heart failure ([place])    a. echo 01/06/11: mild LVH, EF 65-70%, mod to severe LAE, mild RVE, mild RAE, PASP 32;   TEE 10/12: EF 45-50% b. EF 45 to 50% by echo in 07/2018  . Class 2 severe obesity due to excess calories with serious comorbidity and body mass index (BMI) of 37.0 to 37.9 in adult ([place]) 07/17/2017  . Diabetes mellitus    non insulin dependant  . Hyperlipidemia   . Hypertension   . Osteoarthritis   . Presence of permanent cardiac pacemaker   . PSVT (paroxysmal supraventricular tachycardia) ([facility])    Possibly atrial flutter    ROS:   All systems reviewed and negative except as noted in the HPI.   Past Surgical History:  Procedure Laterality Date  . ATRIAL FLUTTER ABLATION N/A 02/07/2011   Procedure: ATRIAL FLUTTER ABLATION;  Surgeon: Evans Lance, MD;  Location: [facility] CATH LAB;  Service: Cardiovascular;  Laterality: N/A;  . CARDIAC ELECTROPHYSIOLOGY STUDY AND ABLATION  02/07/11  . CARDIOVERSION N/A 03/23/2016   Procedure: CARDIOVERSION;  Surgeon: Evans Lance, MD;  Location: [place];  Service: Cardiovascular;  Laterality: N/A;  . COLONOSCOPY N/A 04/17/2017   Procedure: COLONOSCOPY;  Surgeon: Rogene Houston, MD;  [facility];  Service: Endoscopy;  Laterality: N/A;  830  . EP IMPLANTABLE DEVICE N/A 08/29/2015   Procedure: Pacemaker Implant;  Surgeon: Evans Lance, MD;  Location: [place] CV LAB;  Service: Cardiovascular;  Laterality: N/A;  . EP IMPLANTABLE DEVICE N/A 12/07/2015   Procedure: PPM Lead Revision/Repair;  Surgeon: Evans Lance, MD;  Location:  [place] CV LAB;  Service: Cardiovascular;  Laterality: N/A;  . KNEE ARTHROSCOPY  2005   left  . LUMBAR SPINE SURGERY     "I've had 6 ORs 1972 thru 2004"  . POLYPECTOMY  04/17/2017   Procedure: POLYPECTOMY;  Surgeon: Rogene Houston, MD;  [facility];  Service: Endoscopy;;  transverse colon x3;  . RIGHT/LEFT HEART CATH AND CORONARY ANGIOGRAPHY N/A 12/21/2019   Procedure: RIGHT/LEFT HEART CATH AND CORONARY ANGIOGRAPHY;  Surgeon: Nelva Bush, MD;  Location: [place] CV LAB;  Service: Cardiovascular;  Laterality: N/A;  . TEE WITHOUT CARDIOVERSION N/A 11/05/2019   Procedure: TRANSESOPHAGEAL ECHOCARDIOGRAM (TEE) WITH PROPOFOL;  Surgeon: Arnoldo Lenis, MD;  [facility];  Service: Endoscopy;  Laterality: N/A;     Family History  Problem Relation Age of Onset  . Heart attack Mother   . Hypertension Mother   . Heart attack Father   . Hypertension Father   . Heart attack Brother   . Colon cancer Neg Hx      Social History   Socioeconomic History  . Marital status: Married    Spouse name: Not on file  . Number of children: 2  . Years of education: Not on file  . Highest education level: Not on file  Occupational History  . Occupation: Immunologist: RETIRED  Tobacco Use  . Smoking status: Former Smoker    Packs/day: 1.00    Years: 10.00    Pack years: 10.00    Types: Cigarettes    Quit date: 03/26/1986    Years since quitting: 34.0  . Smokeless tobacco: Never Used  . Tobacco comment: "stopped cigarette  smoking 1988"  Vaping Use  . Vaping Use: Never used  Substance and Sexual Activity  . Alcohol use: Not Currently    Alcohol/week: 0.0 standard drinks    Comment: "quit alcohol ~ 2007"  . Drug use: No  . Sexual activity: Yes    Partners: Female  Other Topics Concern  . Not on file  Social History Narrative  . Not on file   Social Determinants of Health   Financial Resource Strain: Not on file  Food Insecurity:  Not on file  Transportation Needs: Not on file  Physical Activity: Not on file  Stress: Not on file  Social Connections: Not on file  Intimate Partner Violence: Not on file     BP 118/78   Pulse 82   Ht 6' (1.829 m)   Wt 234 lb 9.6 oz (106.4 kg)   SpO2 98%   BMI 31.82 kg/m   Physical Exam:  stable appearing NAD HEENT: Unremarkable Neck:  No JVD, no thyromegally Lymphatics:  No adenopathy Back:  No CVA tenderness Lungs:  Clear with basilar rales HEART:  Regular rate rhythm, no murmurs,  no rubs, no clicks Abd:  soft, positive bowel sounds, no organomegally, no rebound, no guarding Ext:  2 plus pulses, weaping peripheral edema, 3+,  no cyanosis, no clubbing Skin:  No rashes no nodules Neuro:  CN II through XII intact, motor grossly intact  EKG - reviewed. Atrial fib with ventricular pacing, QRS 190  DEVICE  Normal device function.  See PaceArt for details.   Assess/Plan: 1. Chronic systolilc heart failure - I have discussed the treatment options. I will review with Dr. Reine Just and Dr. Donna Bernard regarding timing of procedures. I could perform biv upgrade in the next couple of weeks.  2. Atrial fib - I suspect that his atrial fib is not helping but as he has been out of rhythm for a couple of years, less likely to be able to get him back in NSR. 3. AS - he will followup with Dr. Donna Bernard regarding possible TAVR. 4. PPM -his medtronic DDD PM is working normally.  Carleene Overlie Taylor,MD  Addendum:  I discussed the case with Dr. Angelena Form. He will undergo TAVR and if his CHF symptoms remain, then we will consider upgrade of his device.  Carleene Overlie Taylor,MD

## 2020-04-13 NOTE — Patient Instructions (Signed)
Medication Instructions:  Your physician recommends that you continue on your current medications as directed. Please refer to the Current Medication list given to you today.  *If you need a refill on your cardiac medications before your next appointment, please call your pharmacy*   Lab Work: NONE   If you have labs (blood work) drawn today and your tests are completely normal, you will receive your results only by: Marland Kitchen MyChart Message (if you have MyChart) OR . A paper copy in the mail If you have any lab test that is abnormal or we need to change your treatment, we will call you to review the results.   Testing/Procedures: NONE    Follow-Up: At [facility], you and your health needs are our priority.  As part of our continuing mission to provide you with exceptional heart care, we have created designated Provider Care Teams.  These Care Teams include your primary Cardiologist (physician) and Advanced Practice Providers (APPs -  Physician Assistants and Nurse Practitioners) who all work together to provide you with the care you need, when you need it.  We recommend signing up for the patient portal called "MyChart".  Sign up information is provided on this After Visit Summary.  MyChart is used to connect with patients for Virtual Visits (Telemedicine).  Patients are able to view lab/test results, encounter notes, upcoming appointments, etc.  Non-urgent messages can be sent to your provider as well.   To learn more about what you can do with MyChart, go to [email or web address].    Your next appointment:   4 month(s)  The format for your next appointment:   In Person  Provider:   [name], MD   Other Instructions Thank you for choosing [facility]!

## 2020-04-13 NOTE — Progress Notes (Addendum)
ADVANCED HF CLINIC CONSULT NOTE  Referring Physician: Levell July NP, Cardiology  Primary Care: Erven Colla, DO Primary Cardiologist: Dr. Harl Bowie  EP: Dr. Lovena Le HF Cardiologist: Dr. Haroldine Laws  Reason for Referral: Chronic Combined Systolic and Diastolic Heart Failure   HPI:  75 y/o male w/ chronic combined systolic and diastolic HF 2/2 NICM, recent [facility] 9/21 showed no significant coronary disease. Most recent 2D echo 7/21 showed LVEF 30-35%. RV mildly reduced. Also concern for moderate to severe MR. AV also noted to be severely calcified. LVOT/AV VTI ratio: 0.23 by TTE. This was followed by TEE 8/21 which showed only moderate MR and moderate TR. AV was noted to be severely thickened/heavily calcified but no stenosis severity rating.  Also w/ h/o atrial flutter and stage III CKD (baseline SCr ~2), T2DM, HTN and HLD.   Had recent [place] 9/21 that showed moderately elevated filling pressures and moderately reduced CO. CI was 1.8. Severity of aortic stenosis also assessed by cath. Findings c/w moderate aortic stenosis (mean gradient 21 mmHg, AVA 1.3 cm^2).  Had recent admit 12/21 for acute hypoxic respiratory failure 2/2 PNA and CHF. Also w/ symptomatic  cholelithiasis. He was diuresed w/ IV lasix but then developed AKI, felt over diuresed. SCr bump to 2.6 (baseline 1.5-1.8). Also w/ low BP. PO diuretics ultimately restarted but at lower dose. Torsemide reduced from 40 mg bid to once daily. Spironolactone and metolazone also discontinued. His discharge wt was 227 lb.   Post d/c, he went back to the ED on 12/26 for increased dyspnea and LEE and found to be back in a/c CHF. BNP was >2,000. BMP showed improved SCr, down to 1.65. not felt to require admission. Torsemide was increased back up to 40 mg bid and referred back to general cardiology. He has been limited medical therapy due to CKD and borderline hypotension. Only on low dose Coreg and torsemide. Has been referred to [facility] for further  management.   AHF OV on 03/29/20 markedly fluid overloaded w/ 2-3+ pitting edema and JVD elevated to ear. Wt up over 30 lb from recent hospital wt from 227>>260 lb. NYHA Class III w/ 3 pillow orthopnea. BP soft, 98/76.  Referred to [facility] for IV Lasix. Recommend 80 mg IV Lasix BID + 2.5 mg of metolazone daily x 3 days, w/ close monitoring of renal function and BP. Coreg stopped. Referred to EP for evaluation of upgrade to BiV pacer, referred to Structural Heart Team for TAVR consideration, & unna boots ordered.  Today he returns for HF follow up. His weight is down 21 pounds from last visit. Overall feeling "a whole lot better", still SOB after walking about 30 feet. He says this is his baseline.. Able to get around house. Denies increasing SOB, CP, dizziness, does have + 3 pillow PND/Orthopnea. Still with significant LE edema. Appetite fair. No fever or chills. Weight at home ~ 238 pounds. Taking all medications.    2D Echo 7/21 1. Left ventricular ejection fraction, by estimation, is 30 to 35%. The  left ventricle has moderately decreased function. The left ventricle  demonstrates global hypokinesis with abnormal septal motion possibly  consistent with pacing. The left  ventricular internal cavity size was mildly dilated. Left ventricular  diastolic parameters are indeterminate.  2. Right ventricular systolic function is mildly reduced. The right  ventricular size is normal. There is mildly elevated pulmonary artery  systolic pressure. The estimated right ventricular systolic pressure is  61.6 mmHg.  3. Left atrial size was  moderately dilated.  4. Right atrial size was moderately dilated.  5. The mitral valve is abnormal, mildly thickened and calcified. Moderate  to severe mitral valve regurgitation, eccentric and posteriorly directed.  Not clear that PISA calculations best define degree of regurgitation.  6. The aortic valve has an indeterminant number of cusps and is severely   calcified. Aortic valve mean gradient measures 15.5 mmHg. Cannot exclude  severe low gradient aortic stenosis with dimentionless index of 0.23.  7. The inferior vena cava is dilated in size with <50% respiratory  variability, suggesting right atrial pressure of 15 mmHg.    TEE 8/21 1. Left ventricular ejection fraction, by estimation, is 35 to 40%. The  left ventricle has moderately decreased function. The left ventricle  demonstrates global hypokinesis.  2. Right ventricular systolic function is mildly reduced. The right  ventricular size is mildly enlarged.  3. Left atrial size was moderately dilated. No left atrial/left atrial  appendage thrombus was detected. The LAA emptying velocity was 30 cm/s.  4. Right atrial size was moderately dilated.  5. The mitral valve is abnormal. Moderate mitral valve regurgitation. No  evidence of mitral stenosis.  6. Tricuspid valve regurgitation is moderate.  7. The aortic valve is abnormal. Aortic valve regurgitation is not  visualized.   R/LHC 11/2019 Conclusions: 1. No angiographically significant coronary artery disease. 2. Mildly to moderately elevated left heart filling pressures (PCWP 22 mmHg, LVEDP 25-30 mmHg).  Prominent V waves noted on PCWP tracing consistent with significant mitral regurgitation. 3. Severely elevated right heart filling pressures (mean RA and RVEDP 16 mmHg). 4. Moderately reduced Fick cardiac output/index. 5. Moderate aortic stenosis (mean gradient 21 mmHg, AVA 1.3 cm^2).  Recommendations: 1. Continue optimization of evidence-based heart failure as well as further diuresis. 2. Ongoing management of non-ischemic cardiomyopathy and valvular heart disease per Dr. Harl Bowie. 3. Primary prevention of coronary artery disease.  ROS: All systems reviewed and negative except as per HPI.    Past Medical History:  Diagnosis Date  . Atrial flutter ([place]) 12/2010   Admitted with symptomatic bradycardia (HR 40s),  atrial flutter with slow ventricular response 12/2010 + volume overload; AV nodal agents d/c'd and Pradaxa started; RFA in 01/2011  . CHF (congestive heart failure) ([place])   . Chronic combined systolic and diastolic heart failure ([place])    a. echo 01/06/11: mild LVH, EF 65-70%, mod to severe LAE, mild RVE, mild RAE, PASP 32;   TEE 10/12: EF 45-50% b. EF 45 to 50% by echo in 07/2018  . Class 2 severe obesity due to excess calories with serious comorbidity and body mass index (BMI) of 37.0 to 37.9 in adult ([place]) 07/17/2017  . Diabetes mellitus    non insulin dependant  . Hyperlipidemia   . Hypertension   . Osteoarthritis   . Presence of permanent cardiac pacemaker   . PSVT (paroxysmal supraventricular tachycardia) (HCC)    Possibly atrial flutter    Current Outpatient Medications  Medication Sig Dispense Refill  . ACCU-CHEK AVIVA PLUS test strip TEST BLOOD SUGAR TWICE DAILY BEFORE BREAKFAST AND AT BEDTIME 200 strip 1  . apixaban (ELIQUIS) 5 MG TABS tablet Take 1 tablet (5 mg total) by mouth 2 (two) times daily. 180 tablet 3  . benzonatate (TESSALON) 100 MG capsule Take 1 capsule (100 mg total) by mouth 3 (three) times daily as needed for cough. 90 capsule 3  . HYDROcodone-acetaminophen (NORCO/VICODIN) 5-325 MG tablet Take one tablet po every 4 hrs prn pain (Patient taking differently:  Take by mouth as needed. Take one tablet po every 4 hrs prn pain) 28 tablet 0  . insulin degludec (TRESIBA FLEXTOUCH) 200 UNIT/ML FlexTouch Pen Inject 50 Units into the skin at bedtime. 23 mL 0  . Insulin Pen Needle (BD PEN NEEDLE NANO U/F) 32G X 4 MM MISC 1 each by Does not apply route 4 (four) times daily. (Patient taking differently: 1 each by Does not apply route once.) 150 each 5  . Multiple Vitamin (MULTIVITAMIN WITH MINERALS) TABS tablet Take 1 tablet by mouth daily. One A Day for Men    . potassium chloride SA (KLOR-CON) 20 MEQ tablet Take 40 mEq by mouth 2 (two) times daily.    Marland Kitchen torsemide (DEMADEX) 20 MG  tablet Take 2 tablets (40 mg total) by mouth daily. TAKE 2 TABLETS (40 MG TOTAL) BY MOUTH 2 (TWO) TIMES DAILY. 60 tablet 1  . TRULICITY 1.5 KK/9.3GH SOPN INJECT 1.5MG (1 PEN) SUBCUTANEOUSLY EVERY WEEK 2 mL 2  . Zinc 50 MG TABS Take 50 mg by mouth daily.     No current facility-administered medications for this encounter.    No Known Allergies    Social History   Socioeconomic History  . Marital status: Married    Spouse name: Not on file  . Number of children: 2  . Years of education: Not on file  . Highest education level: Not on file  Occupational History  . Occupation: Immunologist: RETIRED  Tobacco Use  . Smoking status: Former Smoker    Packs/day: 1.00    Years: 10.00    Pack years: 10.00    Types: Cigarettes    Quit date: 03/26/1986    Years since quitting: 34.0  . Smokeless tobacco: Never Used  . Tobacco comment: "stopped cigarette  smoking 1988"  Vaping Use  . Vaping Use: Never used  Substance and Sexual Activity  . Alcohol use: Not Currently    Alcohol/week: 0.0 standard drinks    Comment: "quit alcohol ~ 2007"  . Drug use: No  . Sexual activity: Yes    Partners: Female  Other Topics Concern  . Not on file  Social History Narrative  . Not on file   Social Determinants of Health   Financial Resource Strain: Not on file  Food Insecurity: Not on file  Transportation Needs: Not on file  Physical Activity: Not on file  Stress: Not on file  Social Connections: Not on file  Intimate Partner Violence: Not on file      Family History  Problem Relation Age of Onset  . Heart attack Mother   . Hypertension Mother   . Heart attack Father   . Hypertension Father   . Heart attack Brother   . Colon cancer Neg Hx    Wt Readings from Last 3 Encounters:  04/14/20 108.5 kg (239 lb 3.2 oz)  04/13/20 106.4 kg (234 lb 9.6 oz)  04/04/20 112.5 kg (248 lb)    Vitals:   04/14/20 0839  BP: 130/80  Pulse: 65  SpO2: 98%  Weight: 108.5 kg (239  lb 3.2 oz)    PHYSICAL EXAM: General:  NAD. No resp difficulty. Appears older than stated age. HEENT: Normal Neck: Supple. No JVD. Carotids 2+ bilat; no bruits. No lymphadenopathy or thryomegaly appreciated. Cor: PMI nondisplaced. Regular rate & rhythm. No rubs, gallops or III/VI AS RUSB, MR at apex, reduced S2 Lungs: Clear Abdomen: Obese, soft, nontender, nondistended. No hepatosplenomegaly. No bruits or masses. Good bowel  sounds. Extremities: No cyanosis, clubbing, rash, 3+ LE edema, weeping on left leg posteriorly. Neuro: alert & oriented x 3, cranial nerves grossly intact. Moves all 4 extremities w/o difficulty. Affect pleasant.  Device interrogation: Normal device function. Thresholds, sensing, impedances consistent with previous measurements. AT/AF 100%   Reds: 28%  ASSESSMENT & PLAN:  1. Chronic Combined Systolic and Diastolic Heart Failure/NICM: - Echo 08/2015 EF 50-55% (PPM placed in 08/2015)  - Echo 10/2015 EF dropped to 40-45% - Echo 2018 EF 45% - Echo 2021 EF 30-35%, RV mildly reduced  - based on timing of drop in EF and 42% pacing percentage, concern for RV paced CM. Aortic valve also appears severely calcified and moderate-severely stenosed, suspect AS also contributing to CM. Had recent [facility] 9/21 that showed no coronary disease.  - Dr.Taylor agreeable for upgrade Bi-V pacer after TAVR, if HF symptoms persist. - NYHA class III, volume better, still has some fluid today by exam, 3+ pitting edema. - Completed  IV Lasix w/ [facility]. (Recommend 80 mg IV Lasix BID + 2.5 mg of metolazone daily x 3 days) - Continue torsemide 40 mg bid + 40 mEq of KCl bid. - Start Farxiga 10 mg daily. - Coreg stopped w/ low BP. Will hold off restarting today. - [facility] to re-apply unna boots today. - BMET today. Repeat 7-10 days.  2. Mitral Regurgitation  - Moderate by TEE 8/21.  - Likely functional from dilated CM.   3. Aortic Stenosis  - LHC 9/21 showed moderate AS. mean gradient  21 mmHg, AVA 1.3 cm^2 - Reviewed most recent 2D echo. AV appears very calcified and stenotic. Suspect contributing to systolic HF. - Work-up for TAVR in progress with Structural Heart Team.  4. Atrial Flutter/ Chronic Atrial Fibrillation - s/p AFL ablation.  - In chronic Afib, rate controlled.  - Continue Eliquis. No bleeding issues. -  blocker recently stopped to allow more BP room for aggressive diuresis w/ IV Lasix.   5. Stage IIIb CKD - Followed by nephrology.  - Baseline SCr ~1.5-1.8, most recent SCr 1.5 (1/22). - Start [place]. - BMET today.  6. HTN - BP stable. - Continue to hold Coreg for now.   7. T2DM - On insulin. a1C 6.8 (12/21) - Followed by PCP.  - Start [place] as above.  8. H/o CHB  - s/p PPM followed by Dr. Lovena Le  - ? RV paced CM. He is pacing 42%. Had drop in EF 2 months after pacer was placed  - Dr.Taylor agreeable for up-grade to BiV pacer if HF symptoms persist after TAVR.  F/u w/ Dr. Haroldine Laws as scheduled in 2-3 weeks.  [place], [place]  04/14/20 8:46 AM

## 2020-04-14 ENCOUNTER — Encounter (HOSPITAL_COMMUNITY): Payer: Self-pay

## 2020-04-14 ENCOUNTER — Ambulatory Visit (INDEPENDENT_AMBULATORY_CARE_PROVIDER_SITE_OTHER): Payer: Medicare HMO

## 2020-04-14 ENCOUNTER — Ambulatory Visit (HOSPITAL_COMMUNITY)
Admission: RE | Admit: 2020-04-14 | Discharge: 2020-04-14 | Disposition: A | Discharge status: Home / Self Care | Payer: Medicare HMO | Source: Ambulatory Visit | Attending: Family Medicine | Admitting: Family Medicine

## 2020-04-14 VITALS — BP 130/80 | HR 65 | Wt 239.2 lb

## 2020-04-14 DIAGNOSIS — Z794 Long term (current) use of insulin: Secondary | ICD-10-CM | POA: Insufficient documentation

## 2020-04-14 DIAGNOSIS — I130 Hypertensive heart and chronic kidney disease with heart failure and stage 1 through stage 4 chronic kidney disease, or unspecified chronic kidney disease: Secondary | ICD-10-CM | POA: Insufficient documentation

## 2020-04-14 DIAGNOSIS — I10 Essential (primary) hypertension: Secondary | ICD-10-CM | POA: Diagnosis not present

## 2020-04-14 DIAGNOSIS — I340 Nonrheumatic mitral (valve) insufficiency: Secondary | ICD-10-CM

## 2020-04-14 DIAGNOSIS — Z8679 Personal history of other diseases of the circulatory system: Secondary | ICD-10-CM | POA: Diagnosis not present

## 2020-04-14 DIAGNOSIS — I081 Rheumatic disorders of both mitral and tricuspid valves: Secondary | ICD-10-CM | POA: Diagnosis not present

## 2020-04-14 DIAGNOSIS — E1122 Type 2 diabetes mellitus with diabetic chronic kidney disease: Secondary | ICD-10-CM | POA: Diagnosis not present

## 2020-04-14 DIAGNOSIS — I4821 Permanent atrial fibrillation: Secondary | ICD-10-CM | POA: Diagnosis not present

## 2020-04-14 DIAGNOSIS — E119 Type 2 diabetes mellitus without complications: Secondary | ICD-10-CM

## 2020-04-14 DIAGNOSIS — I350 Nonrheumatic aortic (valve) stenosis: Secondary | ICD-10-CM | POA: Diagnosis not present

## 2020-04-14 DIAGNOSIS — I5032 Chronic diastolic (congestive) heart failure: Secondary | ICD-10-CM

## 2020-04-14 DIAGNOSIS — I4892 Unspecified atrial flutter: Secondary | ICD-10-CM | POA: Diagnosis not present

## 2020-04-14 DIAGNOSIS — Z950 Presence of cardiac pacemaker: Secondary | ICD-10-CM | POA: Insufficient documentation

## 2020-04-14 DIAGNOSIS — I482 Chronic atrial fibrillation, unspecified: Secondary | ICD-10-CM | POA: Insufficient documentation

## 2020-04-14 DIAGNOSIS — I5042 Chronic combined systolic (congestive) and diastolic (congestive) heart failure: Secondary | ICD-10-CM | POA: Diagnosis not present

## 2020-04-14 DIAGNOSIS — I428 Other cardiomyopathies: Secondary | ICD-10-CM | POA: Diagnosis not present

## 2020-04-14 DIAGNOSIS — Z7901 Long term (current) use of anticoagulants: Secondary | ICD-10-CM | POA: Insufficient documentation

## 2020-04-14 DIAGNOSIS — Z87891 Personal history of nicotine dependence: Secondary | ICD-10-CM | POA: Diagnosis not present

## 2020-04-14 DIAGNOSIS — E785 Hyperlipidemia, unspecified: Secondary | ICD-10-CM | POA: Diagnosis not present

## 2020-04-14 DIAGNOSIS — Z6837 Body mass index (BMI) 37.0-37.9, adult: Secondary | ICD-10-CM | POA: Insufficient documentation

## 2020-04-14 DIAGNOSIS — I442 Atrioventricular block, complete: Secondary | ICD-10-CM

## 2020-04-14 DIAGNOSIS — N1832 Chronic kidney disease, stage 3b: Secondary | ICD-10-CM | POA: Diagnosis not present

## 2020-04-14 LAB — BASIC METABOLIC PANEL
Anion gap: 13 (ref 5–15)
BUN: 19 mg/dL (ref 8–23)
CO2: 29 mmol/L (ref 22–32)
Calcium: 9.1 mg/dL (ref 8.9–10.3)
Chloride: 96 mmol/L — ABNORMAL LOW (ref 98–111)
Creatinine, Ser: 1.63 mg/dL — ABNORMAL HIGH (ref 0.61–1.24)
GFR, Estimated: 44 mL/min — ABNORMAL LOW (ref >60)
Glucose, Bld: 142 mg/dL — ABNORMAL HIGH (ref 70–99)
Potassium: 3.4 mmol/L — ABNORMAL LOW (ref 3.5–5.1)
Sodium: 138 mmol/L (ref 135–145)

## 2020-04-14 NOTE — Progress Notes (Signed)
ReDS Vest / Clip - 04/14/20 0900      ReDS Vest / Clip   Station Marker D    Ruler Value 38    ReDS Value Range Low volume    ReDS Actual Value 28    Anatomical Comments sitting

## 2020-04-14 NOTE — Patient Instructions (Addendum)
It was great to see you today! No medication changes are needed at this time.   Labs today We will only contact you if something comes back abnormal or we need to make some changes. Otherwise no news is good news!  Keep follow up as scheduled with Dr Haroldine Laws in 2-3 weeks  If you have any questions or concerns before your next appointment please send [place] a message through [place] or call our office at [phone].    TO LEAVE A MESSAGE FOR THE NURSE SELECT OPTION 2, PLEASE LEAVE A MESSAGE INCLUDING: . YOUR NAME . DATE OF BIRTH . CALL BACK NUMBER . REASON FOR CALL**this is important as we prioritize the call backs  YOU WILL RECEIVE A CALL BACK THE SAME DAY AS LONG AS YOU CALL BEFORE 4:00 PM

## 2020-04-15 ENCOUNTER — Telehealth (HOSPITAL_COMMUNITY): Payer: Self-pay | Admitting: Cardiology

## 2020-04-15 ENCOUNTER — Other Ambulatory Visit (HOSPITAL_COMMUNITY): Payer: Self-pay | Admitting: *Deleted

## 2020-04-15 ENCOUNTER — Other Ambulatory Visit: Payer: Self-pay

## 2020-04-15 ENCOUNTER — Telehealth (INDEPENDENT_AMBULATORY_CARE_PROVIDER_SITE_OTHER): Payer: Medicare HMO | Admitting: Family Medicine

## 2020-04-15 DIAGNOSIS — I509 Heart failure, unspecified: Secondary | ICD-10-CM

## 2020-04-15 DIAGNOSIS — N183 Chronic kidney disease, stage 3 unspecified: Secondary | ICD-10-CM | POA: Diagnosis not present

## 2020-04-15 DIAGNOSIS — E1159 Type 2 diabetes mellitus with other circulatory complications: Secondary | ICD-10-CM | POA: Diagnosis not present

## 2020-04-15 DIAGNOSIS — I480 Paroxysmal atrial fibrillation: Secondary | ICD-10-CM

## 2020-04-15 DIAGNOSIS — I350 Nonrheumatic aortic (valve) stenosis: Secondary | ICD-10-CM

## 2020-04-15 DIAGNOSIS — I5032 Chronic diastolic (congestive) heart failure: Secondary | ICD-10-CM

## 2020-04-15 LAB — CUP PACEART REMOTE DEVICE CHECK
Battery Impedance: 304 Ohm
Battery Remaining Longevity: 89 mo
Battery Voltage: 2.78 V
Brady Statistic AP VP Percent: 15 %
Brady Statistic AP VS Percent: 0 %
Brady Statistic AS VP Percent: 20 %
Brady Statistic AS VS Percent: 66 %
Date Time Interrogation Session: 20220121110131
Implantable Lead Implant Date: 20170605
Implantable Lead Implant Date: 20170605
Implantable Lead Location: 753859
Implantable Lead Location: 753860
Implantable Lead Model: 5076
Implantable Lead Model: 5076
Implantable Pulse Generator Implant Date: 20170605
Lead Channel Impedance Value: 398 Ohm
Lead Channel Impedance Value: 500 Ohm
Lead Channel Pacing Threshold Amplitude: 0.75 V
Lead Channel Pacing Threshold Amplitude: 0.875 V
Lead Channel Pacing Threshold Pulse Width: 0.4 ms
Lead Channel Pacing Threshold Pulse Width: 0.4 ms
Lead Channel Setting Pacing Amplitude: 2 V
Lead Channel Setting Pacing Amplitude: 2.5 V
Lead Channel Setting Pacing Pulse Width: 0.4 ms
Lead Channel Setting Sensing Sensitivity: 4 mV

## 2020-04-15 MED ORDER — POTASSIUM CHLORIDE CRYS ER 20 MEQ PO TBCR: 60.0000 meq | EXTENDED_RELEASE_TABLET | Freq: Two times a day (BID) | ORAL | 3 refills | Status: DC

## 2020-04-15 MED ORDER — DAPAGLIFLOZIN PROPANEDIOL 10 MG PO TABS: 10.0000 mg | ORAL_TABLET | Freq: Every day | ORAL | 3 refills | Status: DC

## 2020-04-15 MED ORDER — METOPROLOL TARTRATE 50 MG PO TABS: ORAL_TABLET | ORAL | 0 refills | Status: DC

## 2020-04-15 NOTE — Telephone Encounter (Signed)
-----   Message from Rafael Bihari, [place] sent at 04/14/2020  4:42 PM EST ----- Potassium low. Please have him increase KCl to 60 mEq of KCl daily (3 tabs/day). Also, please have him start Farxiga 10 mg daily. He will have repeat BMET next week.

## 2020-04-15 NOTE — Telephone Encounter (Signed)
-----   Message from Rafael Bihari, [place] sent at 04/14/2020  4:42 PM EST ----- Potassium low. Please have him increase KCl to 60 mEq of KCl daily (3 tabs/day). Also, please have him start Farxiga 10 mg daily. He will have repeat BMET next week.

## 2020-04-15 NOTE — Progress Notes (Signed)
Patient ID: John [name], male    DOB: 01-21-1946, 75 y.o.   MRN: [id number]  Virtual Visit via Telephone Note  I connected with John [name] on 04/15/20 at 10:00 AM EST by telephone and verified that I am speaking with the correct person using two identifiers.  Location: Patient: home Provider: office   I discussed the limitations, risks, security and privacy concerns of performing an evaluation and management service by telephone and the availability of in person appointments. I also discussed with the patient that there may be a patient responsible charge related to this service. The patient expressed understanding and agreed to proceed.  Chief Complaint  Patient presents with   Follow-up   Chronic Kidney Disease   Congestive Heart Failure   Subjective:    HPI   pt is doing a follow up today. Gets meds from specialist. States no concerns or problems and doing well.   Pt seeing cardiology.  Has h/o chf, Afib.  And taking eliquis.  Not having chest pain, sob.  Has chronic lower leg edema.  Dm2- Seeing Dr. Dorris Fetch for diabetes- taking tresiba and on trulicity.  Has had elevated Cr from 2.2 downt o 1.5 and now at 1.63.  Working with changing the toresemide. Trying to balance the leg swelling and his kidney function.  Seeing eye doctor in [place]. Not had it this year. Will need to make an appt. Hasn't had foot exam, but seeing Dr. Dorris Fetch.  Not having any cuts, sores, or ulcers on the feet.    Medical History John [name] has a past medical history of Atrial flutter ([place]) (12/2010), CHF (congestive heart failure) ([place]), Chronic combined systolic and diastolic heart failure ([place]), Class 2 severe obesity due to excess calories with serious comorbidity and body mass index (BMI) of 37.0 to 37.9 in adult [facility]) (07/17/2017), Diabetes mellitus, Hyperlipidemia, Hypertension, Osteoarthritis, Presence of permanent cardiac pacemaker, and PSVT (paroxysmal supraventricular tachycardia)  ([place]).   Outpatient Encounter Medications as of 04/15/2020  Medication Sig   ACCU-CHEK AVIVA PLUS test strip TEST BLOOD SUGAR TWICE DAILY BEFORE BREAKFAST AND AT BEDTIME   apixaban (ELIQUIS) 5 MG TABS tablet Take 1 tablet (5 mg total) by mouth 2 (two) times daily.   benzonatate (TESSALON) 100 MG capsule Take 1 capsule (100 mg total) by mouth 3 (three) times daily as needed for cough.   HYDROcodone-acetaminophen (NORCO/VICODIN) 5-325 MG tablet Take one tablet po every 4 hrs prn pain (Patient taking differently: Take by mouth as needed. Take one tablet po every 4 hrs prn pain)   insulin degludec (TRESIBA FLEXTOUCH) 200 UNIT/ML FlexTouch Pen Inject 50 Units into the skin at bedtime.   Insulin Pen Needle (BD PEN NEEDLE NANO U/F) 32G X 4 MM MISC 1 each by Does not apply route 4 (four) times daily. (Patient taking differently: 1 each by Does not apply route once.)   Multiple Vitamin (MULTIVITAMIN WITH MINERALS) TABS tablet Take 1 tablet by mouth daily. One A Day for Men   potassium chloride SA (KLOR-CON) 20 MEQ tablet Take 40 mEq by mouth 2 (two) times daily.   torsemide (DEMADEX) 20 MG tablet Take 2 tablets (40 mg total) by mouth daily. TAKE 2 TABLETS (40 MG TOTAL) BY MOUTH 2 (TWO) TIMES DAILY.   TRULICITY 1.5 TJ/0.3ES SOPN INJECT 1.5MG (1 PEN) SUBCUTANEOUSLY EVERY WEEK   Zinc 50 MG TABS Take 50 mg by mouth daily.   No facility-administered encounter medications on file as of 04/15/2020.     Review of  Systems  Constitutional: Negative for chills and fever.  HENT: Negative for congestion, rhinorrhea and sore throat.   Respiratory: Negative for cough, shortness of breath and wheezing.   Cardiovascular: Positive for leg swelling (chronic). Negative for chest pain.  Gastrointestinal: Negative for abdominal pain, diarrhea, nausea and vomiting.  Genitourinary: Negative for dysuria and frequency.  Skin: Negative for rash.  Neurological: Negative for dizziness, weakness and headaches.      Vitals There were no vitals taken for this visit.  Objective:   Physical Exam  No PE due to phone visit. Assessment and Plan   1. Stage 3 chronic kidney disease, unspecified whether stage 3a or 3b CKD ([place])  2. Congestive heart failure, unspecified HF chronicity, unspecified heart failure type (HCC)  3. Paroxysmal atrial fibrillation ([place])  4. DM type 2 causing vascular disease (HCC)  chf and afib- stable, cont f/u cardiology.  CKD stage 3- cont to monitor and working with cards on tosemide dosing.   DM2- stable, cont working with endo.  Cont meds.  Last a1c- 6.8.   Pt stating doing well.  Seeing Cardiology and Endo for his medications. Has elevated Cr- this past month and working with Cardiology to change his torsemide to help with kidney function vs. Leg swelling.  Pt to call to get eye exam in [facility] and foot exam at Dr. Liliane Channel office.  F/u [id number] prn   Follow Up Instructions:    I discussed the assessment and treatment plan with the patient. The patient was provided an opportunity to ask questions and all were answered. The patient agreed with the plan and demonstrated an understanding of the instructions.   The patient was advised to call back or seek an in-person evaluation if the symptoms worsen or if the condition fails to improve as anticipated.  I provided 12  minutes of non-face-to-face time during this encounter.  F/u [id number] prn.

## 2020-04-15 NOTE — Telephone Encounter (Signed)
Opened in error

## 2020-04-15 NOTE — Telephone Encounter (Signed)
Pt aware of lab results and voiced understanding Repeat labs at [facility] in [place]

## 2020-04-20 ENCOUNTER — Encounter: Payer: Self-pay | Admitting: Thoracic Surgery (Cardiothoracic Vascular Surgery)

## 2020-04-20 ENCOUNTER — Other Ambulatory Visit: Payer: Self-pay

## 2020-04-20 ENCOUNTER — Institutional Professional Consult (permissible substitution) (INDEPENDENT_AMBULATORY_CARE_PROVIDER_SITE_OTHER): Payer: Medicare HMO | Admitting: Thoracic Surgery (Cardiothoracic Vascular Surgery)

## 2020-04-20 ENCOUNTER — Ambulatory Visit (HOSPITAL_COMMUNITY)
Admission: RE | Admit: 2020-04-20 | Discharge: 2020-04-20 | Disposition: A | Discharge status: Home / Self Care | Payer: Medicare HMO | Source: Ambulatory Visit | Attending: Cardiovascular Disease | Admitting: Cardiovascular Disease

## 2020-04-20 ENCOUNTER — Encounter (HOSPITAL_COMMUNITY)
Admission: RE | Admit: 2020-04-20 | Discharge: 2020-04-20 | Disposition: A | Discharge status: Home / Self Care | Payer: Medicare HMO | Source: Ambulatory Visit | Attending: Cardiovascular Disease | Admitting: Cardiovascular Disease

## 2020-04-20 VITALS — BP 108/72 | HR 78 | Temp 97.6°F | Resp 20 | Ht 72.0 in | Wt 228.0 lb

## 2020-04-20 DIAGNOSIS — K8020 Calculus of gallbladder without cholecystitis without obstruction: Secondary | ICD-10-CM | POA: Diagnosis not present

## 2020-04-20 DIAGNOSIS — I350 Nonrheumatic aortic (valve) stenosis: Secondary | ICD-10-CM

## 2020-04-20 DIAGNOSIS — J90 Pleural effusion, not elsewhere classified: Secondary | ICD-10-CM | POA: Diagnosis not present

## 2020-04-20 DIAGNOSIS — I700 Atherosclerosis of aorta: Secondary | ICD-10-CM | POA: Diagnosis not present

## 2020-04-20 DIAGNOSIS — K5730 Diverticulosis of large intestine without perforation or abscess without bleeding: Secondary | ICD-10-CM | POA: Diagnosis not present

## 2020-04-20 LAB — BASIC METABOLIC PANEL
Anion gap: 11 (ref 5–15)
BUN: 23 mg/dL (ref 8–23)
CO2: 29 mmol/L (ref 22–32)
Calcium: 8.9 mg/dL (ref 8.9–10.3)
Chloride: 95 mmol/L — ABNORMAL LOW (ref 98–111)
Creatinine, Ser: 1.67 mg/dL — ABNORMAL HIGH (ref 0.61–1.24)
GFR, Estimated: 43 mL/min — ABNORMAL LOW (ref >60)
Glucose, Bld: 127 mg/dL — ABNORMAL HIGH (ref 70–99)
Potassium: 4.8 mmol/L (ref 3.5–5.1)
Sodium: 135 mmol/L (ref 135–145)

## 2020-04-20 MED ORDER — SODIUM CHLORIDE 0.9 % WEIGHT BASED INFUSION
3.0000 mL/kg/h | INTRAVENOUS | Status: AC
  Administered 2020-04-20: 3 mL/kg/h via INTRAVENOUS

## 2020-04-20 MED ORDER — SODIUM CHLORIDE 0.9 % WEIGHT BASED INFUSION: 1.0000 mL/kg/h | INTRAVENOUS | Status: DC

## 2020-04-20 MED ORDER — IOHEXOL 350 MG/ML SOLN
95.0000 mL | Freq: Once | INTRAVENOUS | Status: AC | PRN
  Administered 2020-04-20: 95 mL via INTRAVENOUS

## 2020-04-20 NOTE — Progress Notes (Addendum)
HE[facility] AND [place] [facility] CONSULTATION REPORT  Referring Provider is Bensimhon, Shaune Pascal, MD Primary Cardiologist is [name], MD PCP is Erven Colla, DO  Chief Complaint  Patient presents with  . Aortic Stenosis    Surgical consult for TAVR, review all testing    HPI:  Patient is 75 year old African-American male with history of nonischemic cardiomyopathy, chronic combined systolic and diastolic congestive heart failure, aortic stenosis, mitral regurgitation, tricuspid regurgitation, atrial flutter, symptomatic bradycardia status post permanent pacemaker placement, persistent atrial fibrillation on long-term Eliquis, hypertension, hyperlipidemia, and chronic lower extremity edema who has been referred for surgical consultation to discuss treatment options for management of stage D2 severe low-flow low gradient aortic stenosis.  Patient has a long history of congestive heart failure dating back to 2012 when he first presented with symptomatic bradycardia and atrial flutter.  He underwent atrial flutter ablation and initially did well for a period of time but he ultimately developed symptomatic bradycardia requiring permanent pacemaker implantation in 2017.  He has been followed for the last several years by Dr. Harl Bowie for symptoms of chronic combined systolic and diastolic congestive heart failure.  Echocardiogram performed July 2021 revealed global left ventricular systolic dysfunction with ejection fraction estimated 30 to 35%.  There was mildly reduced right ventricular function.  There was evidence consistent with severe, low flow, low gradient aortic stenosis with peak velocity across aortic valve measured 2.6 m/s corresponding to mean transvalvular gradient 15.5 mmHg and aortic valve area calculated 0.86 cm with DVI reportedly 0.23 and stroke-volume index only 22.  TEE performed November 05, 2019 revealed similar  findings.  Left ventricular ejection fraction was estimated 35 to 40% with global left ventricular systolic dysfunction.  There was mildly reduced right ventricular function.  The aortic valve appeared trileaflet although there was some suggestion of fusion of the noncoronary left coronary cusp.  There was severe thickening and calcification of the aortic valve with severely restricted leaflet mobility.  Hemodynamic assessment of aortic stenosis was not reported.  There was moderate mitral regurgitation with PISA area reported 1.01 cm and radius 0.40 cm.  Left and right heart catheterization was performed September 2021 revealing normal coronary artery anatomy with no significant coronary artery disease.  Mean transvalvular gradient across the aortic valve was measured 21 mmHg corresponding to aortic valve area calculated 1.3 cm with cardiac index 1.8 L/min/m.  There was moderate pulmonary hypertension.  Patient was hospitalized December 2021 with acute hypoxic respiratory failure felt secondary to a combination of pneumonia and acute exacerbation of chronic combined systolic and diastolic congestive heart failure.  There was also some question of symptomatic cholelithiasis.  He was diuresed with IV Lasix but developed acute kidney injury with bump in his serum creatinine to 2.6 up from baseline 1.5-1.8.  He was eventually discharged home on oral torsemide therapy.  Patient presented back to the emergency department several weeks later with increasing shortness of breath and lower extremity edema.  BNP was elevated greater than 2000.  Oral torsemide was increased.  He was subsequently referred to the advanced heart failure clinic where he was seen by Dr. Haroldine Laws on March 29, 2020.  Since then the patient has been receiving intravenous Lasix therapy at home.  The patient was referred to the multidisciplinary heart valve clinic and has been evaluated previously by Dr. Angelena Form.  CT angiography was performed  and cardiothoracic surgical consultation requested.  Patient is married and lives with his wife in  [place].  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  He states that he has been slowly going downhill for at least 5 years.  In the past he enjoyed gardening and doing chores around the house.  He is now severely limited by symptoms of chronic heart failure.  He gets short winded with minimal activity and frequently at rest.  He was brought in in a wheelchair by his wife because he could not only ambulate very short distances.  He has never had any chest pain or chest tightness.  He has not had any tachypalpitations, dizzy spells, nor syncope.  He cannot lay flat in bed and he typically tries to sleep on 3 pillows, but he states that he has not been able to sleep much at all recently.  He has severe chronic lower extremity edema.    Past Medical History:  Diagnosis Date  . Atrial flutter ([facility]) 12/2010   Admitted with symptomatic bradycardia (HR 40s), atrial flutter with slow ventricular response 12/2010 + volume overload; AV nodal agents d/c'd and Pradaxa started; RFA in 01/2011  . CHF (congestive heart failure) ([employer])   . Chronic combined systolic and diastolic heart failure ([facility])    a. echo 01/06/11: mild LVH, EF 65-70%, mod to severe LAE, mild RVE, mild RAE, PASP 32;   TEE 10/12: EF 45-50% b. EF 45 to 50% by echo in 07/2018  . Class 2 severe obesity due to excess calories with serious comorbidity and body mass index (BMI) of 37.0 to 37.9 in adult ([place]) 07/17/2017  . Diabetes mellitus    non insulin dependant  . Hyperlipidemia   . Hypertension   . Osteoarthritis   . Presence of permanent cardiac pacemaker   . PSVT (paroxysmal supraventricular tachycardia) (HCC)    Possibly atrial flutter    Past Surgical History:  Procedure Laterality Date  . ATRIAL FLUTTER ABLATION N/A 02/07/2011   Procedure: ATRIAL FLUTTER ABLATION;  Surgeon: Evans Lance, MD;   Location: [facility] CATH LAB;  Service: Cardiovascular;  Laterality: N/A;  . CARDIAC ELECTROPHYSIOLOGY STUDY AND ABLATION  02/07/11  . CARDIOVERSION N/A 03/23/2016   Procedure: CARDIOVERSION;  Surgeon: Evans Lance, MD;  Location: [facility];  Service: Cardiovascular;  Laterality: N/A;  . COLONOSCOPY N/A 04/17/2017   Procedure: COLONOSCOPY;  Surgeon: Rogene Houston, MD;  [facility];  Service: Endoscopy;  Laterality: N/A;  830  . EP IMPLANTABLE DEVICE N/A 08/29/2015   Procedure: Pacemaker Implant;  Surgeon: Evans Lance, MD;  Location: [facility] CV LAB;  Service: Cardiovascular;  Laterality: N/A;  . EP IMPLANTABLE DEVICE N/A 12/07/2015   Procedure: PPM Lead Revision/Repair;  Surgeon: Evans Lance, MD;  Location: [facility] CV LAB;  Service: Cardiovascular;  Laterality: N/A;  . KNEE ARTHROSCOPY  2005   left  . LUMBAR SPINE SURGERY     "I've had 6 ORs 1972 thru 2004"  . POLYPECTOMY  04/17/2017   Procedure: POLYPECTOMY;  Surgeon: Rogene Houston, MD;  [facility];  Service: Endoscopy;;  transverse colon x3;  . RIGHT/LEFT HEART CATH AND CORONARY ANGIOGRAPHY N/A 12/21/2019   Procedure: RIGHT/LEFT HEART CATH AND CORONARY ANGIOGRAPHY;  Surgeon: Nelva Bush, MD;  Location: [facility] CV LAB;  Service: Cardiovascular;  Laterality: N/A;  . TEE WITHOUT CARDIOVERSION N/A 11/05/2019   Procedure: TRANSESOPHAGEAL ECHOCARDIOGRAM (TEE) WITH PROPOFOL;  Surgeon: Arnoldo Lenis, MD;  [facility];  Service: Endoscopy;  Laterality: N/A;  Family History  Problem Relation Age of Onset  . Heart attack Mother   . Hypertension Mother   . Heart attack Father   . Hypertension Father   . Heart attack Brother   . Colon cancer Neg Hx     Social History   Socioeconomic History  . Marital status: Married    Spouse name: Not on file  . Number of children: 2  . Years of education: Not on file  . Highest education level: Not on file  Occupational History  .  Occupation: Immunologist: RETIRED  Tobacco Use  . Smoking status: Former Smoker    Packs/day: 1.00    Years: 10.00    Pack years: 10.00    Types: Cigarettes    Quit date: 03/26/1986    Years since quitting: 34.0  . Smokeless tobacco: Never Used  . Tobacco comment: "stopped cigarette  smoking 1988"  Vaping Use  . Vaping Use: Never used  Substance and Sexual Activity  . Alcohol use: Not Currently    Alcohol/week: 0.0 standard drinks    Comment: "quit alcohol ~ 2007"  . Drug use: No  . Sexual activity: Yes    Partners: Female  Other Topics Concern  . Not on file  Social History Narrative  . Not on file   Social Determinants of Health   Financial Resource Strain: Not on file  Food Insecurity: Not on file  Transportation Needs: Not on file  Physical Activity: Not on file  Stress: Not on file  Social Connections: Not on file  Intimate Partner Violence: Not on file    Current Outpatient Medications  Medication Sig Dispense Refill  . ACCU-CHEK AVIVA PLUS test strip TEST BLOOD SUGAR TWICE DAILY BEFORE BREAKFAST AND AT BEDTIME 200 strip 1  . apixaban (ELIQUIS) 5 MG TABS tablet Take 1 tablet (5 mg total) by mouth 2 (two) times daily. 180 tablet 3  . benzonatate (TESSALON) 100 MG capsule Take 1 capsule (100 mg total) by mouth 3 (three) times daily as needed for cough. 90 capsule 3  . dapagliflozin propanediol (FARXIGA) 10 MG TABS tablet Take 1 tablet (10 mg total) by mouth daily before breakfast. 30 tablet 3  . HYDROcodone-acetaminophen (NORCO/VICODIN) 5-325 MG tablet Take one tablet po every 4 hrs prn pain (Patient taking differently: Take by mouth as needed. Take one tablet po every 4 hrs prn pain) 28 tablet 0  . insulin degludec (TRESIBA FLEXTOUCH) 200 UNIT/ML FlexTouch Pen Inject 50 Units into the skin at bedtime. 23 mL 0  . Insulin Pen Needle (BD PEN NEEDLE NANO U/F) 32G X 4 MM MISC 1 each by Does not apply route 4 (four) times daily. (Patient taking  differently: 1 each by Does not apply route once.) 150 each 5  . metoprolol tartrate (LOPRESSOR) 50 MG tablet Take as directed prior to 1/26 CT scan 1 tablet 0  . Multiple Vitamin (MULTIVITAMIN WITH MINERALS) TABS tablet Take 1 tablet by mouth daily. One A Day for Men    . potassium chloride SA (KLOR-CON) 20 MEQ tablet Take 3 tablets (60 mEq total) by mouth 2 (two) times daily. 180 tablet 3  . torsemide (DEMADEX) 20 MG tablet Take 2 tablets (40 mg total) by mouth daily. TAKE 2 TABLETS (40 MG TOTAL) BY MOUTH 2 (TWO) TIMES DAILY. 60 tablet 1  . TRULICITY 1.5 AC/1.6SA SOPN INJECT 1.5MG (1 PEN) SUBCUTANEOUSLY EVERY WEEK 2 mL 2  . Zinc 50 MG TABS Take 50 mg by  mouth daily.     No current facility-administered medications for this visit.   Facility-Administered Medications Ordered in Other Visits  Medication Dose Route Frequency Provider Last Rate Last Admin  . 0.9% sodium chloride infusion  1 mL/kg/hr Intravenous Continuous Burnell Blanks, MD   Stopped at 04/20/20 1244    No Known Allergies    Review of Systems:   General:  decreased appetite, decreased energy, + weight gain, + weight loss, no fever  Cardiac:  no chest pain with exertion, no chest pain at rest, +SOB with any exertion, occasional resting SOB, + PND, + orthopnea, no palpitations, + arrhythmia, + atrial fibrillation, + LE edema, no dizzy spells, no syncope  Respiratory:  + shortness of breath, no home oxygen, no productive cough, + dry cough, no bronchitis, no wheezing, no hemoptysis, no asthma, no pain with inspiration or cough, no sleep apnea, no CPAP at night  GI:   no difficulty swallowing, no reflux, no frequent heartburn, no hiatal hernia, no abdominal pain, no constipation, no diarrhea, no hematochezia, no hematemesis, no melena  GU:   no dysuria,  no frequency, no urinary tract infection, no hematuria, no enlarged prostate, no kidney stones, + kidney disease  Vascular:  no pain suggestive of claudication, no pain  in feet, no leg cramps, no varicose veins, no DVT, no non-healing foot ulcer  Neuro:   no stroke, no TIA's, no seizures, no headaches, no temporary blindness one eye,  no slurred speech, no peripheral neuropathy, no chronic pain, no instability of gait, no memory/cognitive dysfunction  Musculoskeletal: no arthritis, no joint swelling, no myalgias, no difficulty walking,  mobility limited by SOB  Skin:   no rash, no itching, no skin infections, no pressure sores or ulcerations  Psych:   no anxiety, no depression, no nervousness, no unusual recent stress  Eyes:   no blurry vision, no floaters, no recent vision changes, + wears glasses or contacts  ENT:   no hearing loss, no loose or painful teeth, no dentures, last saw dentist 2 years ago  Hematologic:  no easy bruising, no abnormal bleeding, no clotting disorder, no frequent epistaxis  Endocrine:  + diabetes, does check CBG's at home           Physical Exam:   BP 108/72   Pulse 78   Temp 97.6 F (36.4 C) (Skin)   Resp 20   Ht 6' (1.829 m)   Wt 228 lb (103.4 kg)   SpO2 100% Comment: RA  BMI 30.92 kg/m   General:  Pleasant African-American male in no acute distress  HEENT:  Unremarkable   Neck:   no JVD, no bruits, no adenopathy   Chest:   clear to auscultation, symmetrical breath sounds, no wheezes, no rhonchi   CV:   Irregular rate and rhythm w/ grade III/VI crescendo/decrescendo murmur heard best at LLSB,  no diastolic murmur  Abdomen:  soft, non-tender, no masses   Extremities:  warm, well-perfused, pulses not palpable, 2+ bilateral LE edema  Rectal/GU  Deferred  Neuro:   Grossly non-focal and symmetrical throughout  Skin:   Clean and dry, no rashes, no breakdown   Diagnostic Tests:   ECHOCARDIOGRAM REPORT       Patient Name:  VEER ELAMIN Date of Exam: 10/14/2019  Medical Rec #: [id number]    Height:    72.0 in  Accession #:  [id number]    Weight:    242.2 lb  Date of Birth: 1945/08/11  BSA:     2.311 m  Patient Age:  28 years     BP:      120/80 mmHg  Patient Gender: M        HR:      88 bpm.  Exam Location: [facility]   Procedure: 2D Echo, Cardiac Doppler and Color Doppler   Indications:   I35.0 Aortic valve stenosis    History:     Patient has prior history of Echocardiogram examinations,  most          recent 08/20/2018. CHF, Pacemaker, CKD, Aortic Valve  Disease,          Arrythmias:Atrial Flutter, Atrial Fibrillation and  Complete          heart block; Risk Factors:Hypertension, Diabetes,  Dyslipidemia          and Former Smoker.    Sonographer:   Jeneen Montgomery RDMS, RVT, RDCS  Referring Phys: [id number] Alphonse Guild BRANCH  Diagnosing Phys: Rozann Lesches MD   IMPRESSIONS    1. Left ventricular ejection fraction, by estimation, is 30 to 35%. The  left ventricle has moderately decreased function. The left ventricle  demonstrates global hypokinesis with abnormal septal motion possibly  consistent with pacing. The left  ventricular internal cavity size was mildly dilated. Left ventricular  diastolic parameters are indeterminate.  2. Right ventricular systolic function is mildly reduced. The right  ventricular size is normal. There is mildly elevated pulmonary artery  systolic pressure. The estimated right ventricular systolic pressure is  29.7 mmHg.  3. Left atrial size was moderately dilated.  4. Right atrial size was moderately dilated.  5. The mitral valve is abnormal, mildly thickened and calcified. Moderate  to severe mitral valve regurgitation, eccentric and posteriorly directed.  Not clear that PISA calculations best define degree of regurgitation.  6. The aortic valve has an indeterminant number of cusps and is severely  calcified. Aortic valve mean gradient measures 15.5 mmHg. Cannot exclude  severe low gradient aortic stenosis with dimentionless index of 0.23.  7.  The inferior vena cava is dilated in size with <50% respiratory  variability, suggesting right atrial pressure of 15 mmHg.   FINDINGS  Left Ventricle: Left ventricular ejection fraction, by estimation, is 30  to 35%. The left ventricle has moderately decreased function. The left  ventricle demonstrates global hypokinesis. The left ventricular internal  cavity size was mildly dilated.  There is no left ventricular hypertrophy. Abnormal (paradoxical) septal  motion, consistent with RV pacemaker. Left ventricular diastolic  parameters are indeterminate.   Right Ventricle: The right ventricular size is normal. No increase in  right ventricular wall thickness. Right ventricular systolic function is  mildly reduced. There is mildly elevated pulmonary artery systolic  pressure. The tricuspid regurgitant velocity  is 2.56 m/s, and with an assumed right atrial pressure of 15 mmHg, the  estimated right ventricular systolic pressure is 98.9 mmHg.   Left Atrium: Left atrial size was moderately dilated.   Right Atrium: Right atrial size was moderately dilated.   Pericardium: There is no evidence of pericardial effusion.   Mitral Valve: The mitral valve is abnormal. There is mild thickening of  the mitral valve leaflet(s). There is mild calcification of the mitral  valve leaflet(s). Moderate to severe mitral valve regurgitation, with  eccentric posteriorly directed jet.   Tricuspid Valve: The tricuspid valve is grossly normal. Tricuspid valve  regurgitation is mild.   Aortic Valve: The aortic valve has an indeterminant number of cusps.  Aortic  valve regurgitation is not visualized. There is severe calcifcation  of the aortic valve. Aortic valve mean gradient measures 15.5 mmHg. Aortic  valve peak gradient measures 26.0  mmHg. Aortic valve area, by VTI measures 0.86 cm.   Pulmonic Valve: The pulmonic valve was grossly normal. Pulmonic valve  regurgitation is trivial.   Aorta: The aortic  root is normal in size and structure.   Venous: The inferior vena cava is dilated in size with less than 50%  respiratory variability, suggesting right atrial pressure of 15 mmHg.   IAS/Shunts: No atrial level shunt detected by color flow Doppler.   Additional Comments: A pacer wire is visualized.     LEFT VENTRICLE  PLAX 2D  LVIDd:     6.02 cm   Diastology  LVIDs:     4.08 cm   LV e' lateral:  12.50 cm/s  LV PW:     0.90 cm   LV E/e' lateral: 10.4  LV IVS:    0.65 cm   LV e' medial:  6.00 cm/s  LVOT diam:   2.20 cm   LV E/e' medial: 21.7  LV SV:     51  LV SV Index:  22  LVOT Area:   3.80 cm    LV Volumes (MOD)  LV vol d, MOD A2C: 168.5 ml  LV vol d, MOD A4C: 149.7 ml  LV vol s, MOD A2C: 89.2 ml  LV vol s, MOD A4C: 82.0 ml  LV SV MOD A2C:   79.3 ml  LV SV MOD A4C:   149.7 ml  LV SV MOD BP:   73.9 ml   RIGHT VENTRICLE  RV S prime:   10.30 cm/s  TAPSE (M-mode): 1.8 cm   LEFT ATRIUM      Index  LA diam:   3.30 cm 1.43 cm/m  LA Vol (A2C): 100.0 ml 43.27 ml/m  LA Vol (A4C): 94.0 ml 40.67 ml/m  AORTIC VALVE  AV Area (Vmax):  1.02 cm  AV Area (Vmean):  0.94 cm  AV Area (VTI):   0.86 cm  AV Vmax:      255.00 cm/s  AV Vmean:     188.500 cm/s  AV VTI:      0.597 m  AV Peak Grad:   26.0 mmHg  AV Mean Grad:   15.5 mmHg  LVOT Vmax:     68.50 cm/s  LVOT Vmean:    46.600 cm/s  LVOT VTI:     0.135 m  LVOT/AV VTI ratio: 0.23    AORTA  Ao Root diam: 3.00 cm   MITRAL VALVE         TRICUSPID VALVE  MV Area (PHT):        TR Peak grad:  26.2 mmHg  MV Decel Time: 211 msec   TR Vmax:    256.00 cm/s  MR Peak grad:  81.5 mmHg  MR Mean grad:  45.0 mmHg  SHUNTS  MR Vmax:     451.25 cm/s Systemic VTI: 0.14 m  MR Vmean:    308.0 cm/s Systemic Diam: 2.20 cm  MR PISA Eff ROA: 5 mm  MV E velocity: 130.00 cm/s  MV A velocity: 18.40  cm/s  MV E/A ratio: 7.07   Rozann Lesches MD  Electronically signed by Rozann Lesches MD  Signature Date/Time: 10/14/2019/5:21:18 PM      TRANSESOPHOGEAL ECHO REPORT       Patient Name:  [name] Date of Exam: 11/05/2019  Medical Rec #: [id number]  Height:    72.0 in  Accession #:  [id number]    Weight:    224.0 lb  Date of Birth: 1945-04-17    BSA:     2.236 m  Patient Age:  28 years     BP:      112/93 mmHg  Patient Gender: M        HR:      65 bpm.  Exam Location: [facility]   Procedure: Transesophageal Echo   Indications:  Congestive Heart Failure 428.0 / I50.9    History:    Patient has prior history of Echocardiogram examinations,  most         recent 10/14/2019. CHF, Pacemaker, Arrythmias:Atrial  Fibrillation         and Atrial Flutter; Risk Factors:Former Smoker,  Dyslipidemia and         Diabetes. CHB, Elevated troponin, NSVT.    Sonographer:  [name] RDCS (AE)  Referring Phys: [place].   PROCEDURE: After discussion of the risks and benefits of a TEE, an  informed consent was obtained from the patient. The transesophogeal probe  was passed without difficulty through the esophogus of the patient. Local  oropharyngeal anesthetic was provided  with viscous lidocaine. Sedation performed by performing physician. The  patient developed no complications during the procedure.   IMPRESSIONS    1. Left ventricular ejection fraction, by estimation, is 35 to 40%. The  left ventricle has moderately decreased function. The left ventricle  demonstrates global hypokinesis.  2. Right ventricular systolic function is mildly reduced. The right  ventricular size is mildly enlarged.  3. Left atrial size was moderately dilated. No left atrial/left atrial  appendage thrombus was detected. The LAA emptying velocity was 30 cm/s.  4. Right atrial size  was moderately dilated.  5. The mitral valve is abnormal. Moderate mitral valve regurgitation. No  evidence of mitral stenosis.  6. Tricuspid valve regurgitation is moderate.  7. The aortic valve is abnormal. Aortic valve regurgitation is not  visualized.   FINDINGS  Left Ventricle: Left ventricular ejection fraction, by estimation, is 35  to 40%. The left ventricle has moderately decreased function. The left  ventricle demonstrates global hypokinesis. The left ventricular internal  cavity size was normal in size.  There is no left ventricular hypertrophy.   Right Ventricle: The right ventricular size is mildly enlarged. Right  vetricular wall thickness was not assessed. Right ventricular systolic  function is mildly reduced.   Left Atrium: Left atrial size was moderately dilated. No left atrial/left  atrial appendage thrombus was detected. The LAA emptying velocity was 30  cm/s.   Right Atrium: Right atrial size was moderately dilated.   Pericardium: There is no evidence of pericardial effusion.   Mitral Valve: The MV vena contracta is 0.4 cm. The mitral valve is  abnormal. Moderate mitral valve regurgitation. No evidence of mitral valve  stenosis.   Tricuspid Valve: The tricuspid valve is normal in structure. Tricuspid  valve regurgitation is moderate . No evidence of tricuspid stenosis.   Aortic Valve: The noncoronary and left coronary cusp are calcified and  fused. The aortic valve is abnormal. . There is severe thickening and  severe calcifcation of the aortic valve. Aortic valve regurgitation is not  visualized. Moderate to severe aortic  valve annular calcification. There is severe thickening of the aortic  valve. There is severe calcifcation of the aortic valve.   Pulmonic Valve: The pulmonic valve was not well visualized.  Pulmonic valve  regurgitation is not visualized. No evidence of pulmonic stenosis.   Aorta: Normal visualized portions of the descending  thoracic aorta. The  aortic root is normal in size and structure.   IAS/Shunts: No atrial level shunt detected by color flow Doppler.     MR Peak grad:  71.6 mmHg  MR Mean grad:  42.0 mmHg  MR Vmax:     423.00 cm/s  MR Vmean:    296.0 cm/s  MR PISA:     1.01 cm  MR PISA Eff ROA: 6 mm  MR PISA Radius: 0.40 cm   [name] MD  Electronically signed by [name] MD  Signature Date/Time: 11/06/2019/3:59:20 PM      RIGHT/LEFT HEART CATH AND CORONARY ANGIOGRAPHY    Conclusion  Conclusions: 1. No angiographically significant coronary artery disease. 2. Mildly to moderately elevated left heart filling pressures (PCWP 22 mmHg, LVEDP 25-30 mmHg).  Prominent V waves noted on PCWP tracing consistent with significant mitral regurgitation. 3. Severely elevated right heart filling pressures (mean RA and RVEDP 16 mmHg). 4. Moderately reduced Fick cardiac output/index. 5. Moderate aortic stenosis (mean gradient 21 mmHg, AVA 1.3 cm^2).  Recommendations: 1. Continue optimization of evidence-based heart failure as well as further diuresis. 2. Ongoing management of non-ischemic cardiomyopathy and valvular heart disease per Dr. Harl Bowie. 3. Primary prevention of coronary artery disease.  Nelva Bush, MD [facility]   Recommendations  Antiplatelet/Anticoag Recommend to resume Apixaban, at currently prescribed dose and frequency on 12/22/2019. Concurrent antiplatelet therapy not recommended.  Discharge Date In the absence of any other complications or medical issues, we expect the patient to be ready for discharge on 12/21/2019.   Surgeon Notes    12/21/2019 8:46 AM Brief Op Note signed by Nelva Bush, MD    Indications  Chronic HFrEF (heart failure with reduced ejection fraction) (HCC) [I50.22 (ICD-10-CM)]  Aortic valve stenosis, etiology of cardiac valve disease unspecified [I35.0 (ICD-10-CM)]   Procedural Details  Technical Details  Indication: 75 y.o. year-old man with chronic HFrEF atrial flutter, PAF, CHB s/p PPM, HTN, aortic stenosis, abnormal liver test, presenting for evaluation of shortness of breath in the setting of reduced LVEF and moderate to severe aortic stenosis by echo.  GFR: 41 ml/min  Procedure: The risks, benefits, complications, treatment options, and expected outcomes were discussed with the patient. The patient and/or family concurred with the proposed plan, giving informed consent. The patient was sedated with IV midazolam and fentanyl. The right wrist was assessed with a modified Allens test which was normal. The right wrist was prepped and draped in a sterile fashion. 1% lidocaine was used for local anesthesia. A previously placed antecubital vein IV was exchanged for a 58F slender Glidesheath using modified Seldinger technique. Right heart catheterization was performed by advancing a 58F balloon-tipped catheter through the right heart chambers into the pulmonary capillary wedge position. Pressure measurements and oxygen saturations were obtained.  Using the modified Seldinger access technique, a 74F slender Glidesheath was placed in the right radial artery. 3 mg Verapamil was given through the sheath. Heparin 5,000 units were administered. Tortuosity of the right subclavian/brachiocephalic artery limited catheter manipulation.  Therefore, the indwelling sheath was exchanged for a 75 cm 74F Destination Glidesheath over the J-wire.  Selective coronary angiography was performed using a 58F JL3.5 catheter to engage the left coronary artery and a 58F JR4 catheter to engage the right coronary artery. Left heart catheterization was performed using a 58F JR4 catheter.  The JR4 catheter was  exchanged for a 4F pigtail catheter and simultaneous aortic and left ventricular pressures were recorded using the sheath and catheter.  Left ventriculogram was not performed.  At the end of the procedure, the radial artery sheath was  removed and a TR band applied to achieve patent hemostasis. The right antecubital vein sheath was removed and hemostasis achieved with manual compression.  There were no immediate complications. The patient was taken to the recovery area in stable condition.  Estimated blood loss <50 mL.   During this procedure medications were administered to achieve and maintain moderate conscious sedation while the patient's heart rate, blood pressure, and oxygen saturation were continuously monitored and I was present face-to-face 100% of this time.   Medications (Filter: Administrations occurring from 0725 to 0839 on 12/21/19) (important) Continuous medications are totaled by the amount administered until 12/21/19 0839.    Heparin (Porcine) in NaCl 1000-0.9 UT/500ML-% SOLN (mL) Total volume:  1,000 mL  Date/Time Rate/Dose/Volume Action   12/21/19 0734 500 mL Given   0735 500 mL Given    fentaNYL (SUBLIMAZE) injection (mcg) Total dose:  25 mcg  Date/Time Rate/Dose/Volume Action   12/21/19 0744 25 mcg Given    midazolam (VERSED) injection (mg) Total dose:  1 mg  Date/Time Rate/Dose/Volume Action   12/21/19 0745 1 mg Given    lidocaine (PF) (XYLOCAINE) 1 % injection (mL) Total volume:  4 mL  Date/Time Rate/Dose/Volume Action   12/21/19 0745 2 mL Given   0755 2 mL Given    Radial Cocktail/Verapamil only (mL) Total volume:  10 mL  Date/Time Rate/Dose/Volume Action   12/21/19 0756 10 mL Given    heparin sodium (porcine) injection (Units) Total dose:  5,000 Units  Date/Time Rate/Dose/Volume Action   12/21/19 0758 5,000 Units Given    iohexol (OMNIPAQUE) 350 MG/ML injection (mL) Total volume:  25 mL  Date/Time Rate/Dose/Volume Action   12/21/19 0835 25 mL Given    Sedation Time  Sedation Time Physician-1: 45 minutes 26 seconds   Contrast  Medication Name Total Dose  iohexol (OMNIPAQUE) 350 MG/ML injection 25 mL    Radiation/Fluoro  Fluoro time: 10.7 (min) DAP: 27425  (mGycm2) Cumulative Air Kerma: 128 (mGy)   Complications   Complications documented before study signed (12/21/2019 78:67 PM)    No complications were associated with this study.  Documented by Nelva Bush, MD - 12/21/2019 9:20 AM     Coronary Findings   Diagnostic Dominance: Right  Left Main  Vessel is large. Vessel is angiographically normal.  Left Anterior Descending  Vessel is large. Vessel is angiographically normal.  First Diagonal Branch  Vessel is large in size.  Second Diagonal Branch  Vessel is small in size.  Third Diagonal Branch  Vessel is small in size.  Ramus Intermedius  Vessel is moderate in size. Vessel is angiographically normal.  Left Circumflex  Vessel is moderate in size. Vessel is angiographically normal.  First Obtuse Marginal Branch  Vessel is moderate in size.  Right Coronary Artery  Vessel is moderate in size. Vessel is angiographically normal.  Right Posterior Descending Artery  Vessel is small in size.  Right Posterior Atrioventricular Artery  Vessel is small in size.   Intervention   No interventions have been documented.  Right Heart  Right Heart Pressures RA (mean): 16 mmHg RV (S/EDP): 60/16 mmHg PA (S/D, mean): 52/20 (31) mmHg PCWP (mean): 22 mmHg with prominent V waves  Ao sat: 98% PA sat: 60%  Fick CO: 4.0 L/min Fick CI: 1.8  L/min/m^2   Left Heart  Left Ventricle LV end diastolic pressure is moderately elevated. LVEDP 25-30 mmHg.  Aortic Valve There is moderate aortic valve stenosis. Mean gradient 21: mmHg. Valve area: 1.3 cm^2.   Coronary Diagrams   Diagnostic Dominance: Right    Intervention    Implants    No implant documentation for this case.    Syngo Images  Show images for CARDIAC CATHETERIZATION  Images on Long Term Storage  Show images for Nuh, Lipton to Procedure Log  Procedure Log     Hemo Data  Flowsheet Row Most Recent Value  Fick Cardiac Output 4 L/min   Fick Cardiac Output Index 1.8 (L/min)/BSA  Aortic Mean Gradient 20.9 mmHg  Aortic Peak Gradient 9 mmHg  Aortic Valve Area 1.31  Aortic Value Area Index 0.59 cm2/BSA  RA A Wave 18 mmHg  RA V Wave 16 mmHg  RA Mean 14 mmHg  RV Systolic Pressure 49 mmHg  RV Diastolic Pressure 4 mmHg  RV EDP 17 mmHg  PA Systolic Pressure 47 mmHg  PA Diastolic Pressure 17 mmHg  PA Mean 30 mmHg  PW A Wave 21 mmHg  PW V Wave 34 mmHg  PW Mean 21 mmHg  AO Systolic Pressure 734 mmHg  AO Diastolic Pressure 64 mmHg  AO Mean 81 mmHg  LV Systolic Pressure 193 mmHg  LV Diastolic Pressure 9 mmHg  LV EDP 24 mmHg  AOp Systolic Pressure 98 mmHg  AOp Diastolic Pressure 57 mmHg  AOp Mean Pressure 74 mmHg  LVp Systolic Pressure 790 mmHg  LVp Diastolic Pressure 9 mmHg  LVp EDP Pressure 23 mmHg  QP/QS 1  TPVR Index 16.1 HRUI    EKG: Sinus rhythm w/ complete heart block and AV paced rhythm   ULTRASOUND ABDOMEN LIMITED RIGHT UPPER QUADRANT  COMPARISON:  11/24/2019  FINDINGS: Gallbladder:  Sludge and stones within the gallbladder. Slight increased gallbladder wall thickness of 4.3 mm. Positive sonographic Murphy.  Common bile duct:  Diameter: 3.7 mm  Liver:  Coarse liver with suspected contour nodularity/cirrhosis. No focal hepatic abnormality. Portal vein is patent on color Doppler imaging with normal direction of blood flow towards the liver.  Other: None.  IMPRESSION: 1. Sludge and stones in the gallbladder with slight wall thickening and positive sonographic Murphy, concerning for acute cholecystitis. Negative for biliary dilatation 2. Suspected liver cirrhosis.   Electronically Signed   By: Donavan Foil M.D.   On: 03/09/2020 17:56    Cardiac TAVR CT  TECHNIQUE: The patient was scanned on a Siemens Force 240 slice scanner. A 120 kV retrospective scan was triggered in the descending thoracic aorta at 111 HU's. Gantry rotation speed was 270 msecs and collimation  was .9 mm. No beta blockade or nitro were given. The 3D data set was reconstructed in 5% intervals of the R-R cycle. Systolic and diastolic phases were analyzed on a dedicated work station using MPR, MIP and VRT modes. The patient received 80 cc of contrast.  FINDINGS: Aortic Valve: Tri leaflet calcified with restricted leaflet motion Calcium score for valve 1819  Aorta: No aneurysm mild calcific atherosclerosis  Sinotubular Junction: 29 mm  Ascending Thoracic Aorta: 33 mm  Aortic Arch: 30 mm  Descending Thoracic Aorta: 25 mm  Sinus of Valsalva Measurements:  Non-coronary: 34 mm  Right - coronary: 27.7 mm  Left - coronary: 32.6 mm  Coronary Artery Height above Annulus:  Left Main: 13.6 mm above annulus  Right Coronary: 15.5 mm above annulus  Virtual  Basal Annulus Measurements:  Maximum/Minimum Diameter: 31.6 mm x 21.5 mm Elliptical  Perimeter: 90.6 mm  Area: 589.5 mm2  Coronary Arteries: Sufficient height above annulus for deployment  Pacing wires in RA/RV  Optimum Fluoroscopic Angle for Delivery: LAO 15 Caudal 14 degrees  IMPRESSION: 1. Tri-leaflet AV with calcium score 1819 and annular area of 589 mm2 suitable for a 29 mm Sapien 3 valve  2.  Coronary arteries sufficient height above annulus for deployment  3. Optimum angiographic angle for deployment LAO 15 Caudal 14 degrees  4.  Normal aortic root 3.3 cm  Jenkins Rouge   Electronically Signed   By: Jenkins Rouge M.D.   On: 04/20/2020 13:16      Impression:  Patient has longstanding history of nonischemic cardiomyopathy with slowly progressive symptoms of chronic combined systolic and diastolic congestive heart failure.  Within the last few months he had hospitalization with acute exacerbation of chronic symptoms and since then he has continued to struggle with severe exertional shortness of breath, intermittent resting shortness of breath, orthopnea, and severe  lower extremity edema despite aggressive medical therapy including home intravenous Lasix therapy over the past few weeks, New York Heart Association functional class IIIb / IV  I have personally reviewed the patient's most recent transthoracic and transesophageal echocardiograms performed last summer, diagnostic cardiac catheterization performed September 2021, EKG performed in December, and CT angiograms performed earlier today.  Echocardiograms reveal moderate to severe global left ventricular systolic dysfunction with ejection fraction estimated 30 to 35%.  There is some dyskinesis although no segmental wall motion abnormalities.  The aortic valve appears trileaflet with moderate thickening and calcification involving all 3 leaflets.  Peak velocity across the aortic valve on transthoracic echocardiogram measured 2.6 m/s corresponding to mean transvalvular gradient estimated only 15.5 mmHg, but aortic valve area calculated only 0.86 cm and DVI was notably quite low at 0.23 with stroke-volume index only 22, all consistent with a diagnosis of severe low-flow, low gradient aortic stenosis.  Diagnostic cardiac catheterization revealed no significant coronary artery disease with moderate pulmonary hypertension and low normal cardiac output.  Mean transvalvular gradient across the aortic valve measured 21 mmHg corresponding to aortic valve area calculated 1.3 cm.  EKG reveals sinus rhythm with ventricular paced rhythm and QRS duration 188 ms.  Cardiac gated CT angiogram the heart revealed findings consistent with trileaflet aortic valve with moderate to severe thickening and calcification involving all 3 leaflets.  Functional anatomy appeared treatable using transcatheter aortic valve replacement without any significant complicating features.  CT angiogram of the aortic and iliac vessels has not yet formally been read by radiologist but reveals moderate disease at the aortic bifurcation but what appears to be  likely adequate pelvic vascular access to facilitate a transfemoral approach.  There does appear to be sludge in the gallbladder but there does not appear to be significant gallbladder thickening or pericholecystic fluid.  Overall the patient appears to be struggling and gradually going downhill with extremely poor functional status all related to his progressive chronic combined systolic and diastolic congestive heart failure.  I agree that he appears to have severe stage D2 low-flow low gradient aortic stenosis, although he also has moderate secondary mitral regurgitation, moderate to severe tricuspid regurgitation, and significant intrinsic myocardial disease.  I suspect he might benefit from aortic valve replacement but I would not consider him a candidate for conventional surgery under any circumstances.  He might also benefit from upgrade of his current permanent pacemaker to biventricular system.  Plan:  The patient and his wife were counseled at length regarding treatment alternatives for management of severe symptomatic aortic stenosis.  Alternative approaches such as conventional aortic valve replacement, transcatheter aortic valve replacement, and continued medical therapy without intervention were compared and contrasted at length.  The risks associated with conventional surgical aortic valve replacement were discussed in detail, as were expectations for post-operative convalescence, and why I would be not consider this patient a candidate for conventional surgery under any circumstances.  Issues specific to transcatheter aortic valve replacement were discussed including questions about long term valve durability, the potential for paravalvular leak, possible increased risk of need for permanent pacemaker placement, and other technical complications related to the procedure itself.  We also discussed the multifactor nature of the patient's underlying chronic heart failure as well as long-term  prognosis with medical therapy. This discussion was placed in the context of the patient's own specific clinical presentation and past medical history.  All of their questions have been addressed.  The patient is interested in proceeding with further evaluation and possible diagnostic testing to consider whether or not to proceed with high risk transcatheter aortic valve replacement and/or upgrade of his existing pacemaker to biventricular system.  As a next step the patient's multiple diagnostic tests and history will be reviewed by our multidisciplinary heart valve team in combination with the advanced heart failure team.  He may require hospitalization for medical tune up for proceeding with any type of interventional therapy.  Whether or not the patient should undergo HIDA scan prior to any intervention to rule out the presence of acute cholecystitis will be discussed.    I spent in excess of 90 minutes during the conduct of this office consultation and >50% of this time involved direct face-to-face encounter with the patient for counseling and/or coordination of their care.      Valentina Gu. Roxy Manns, MD 04/20/2020 2:19 PM

## 2020-04-20 NOTE — Patient Instructions (Signed)
Continue all previous medications without any changes at this time  Check your weight on a regular basis and keep a log for your records.  Look for signs of fluid overload such as worsening swelling of your lower legs, increased shortness of breath with activity, and/or a dry nonproductive cough.  Discussed these findings with your cardiologist including whether or not you should adjust your fluid pill dosage (diuretic).   

## 2020-04-25 ENCOUNTER — Other Ambulatory Visit: Payer: Self-pay

## 2020-04-25 ENCOUNTER — Emergency Department (HOSPITAL_COMMUNITY): Payer: Medicare HMO

## 2020-04-25 ENCOUNTER — Emergency Department (HOSPITAL_COMMUNITY)
Admission: EM | Admit: 2020-04-25 | Discharge: 2020-04-25 | Disposition: A | Discharge status: Home / Self Care | Payer: Medicare HMO | Source: Home / Self Care

## 2020-04-25 ENCOUNTER — Inpatient Hospital Stay: Payer: Self-pay

## 2020-04-25 ENCOUNTER — Inpatient Hospital Stay (HOSPITAL_COMMUNITY)
Admission: AD | Admit: 2020-04-25 | Discharge: 2020-05-15 | DRG: 266 | Disposition: A | Discharge status: Home Health | Payer: Medicare HMO | Source: Ambulatory Visit | Attending: Internal Medicine | Admitting: Internal Medicine

## 2020-04-25 ENCOUNTER — Inpatient Hospital Stay (HOSPITAL_COMMUNITY): Payer: Medicare HMO

## 2020-04-25 DIAGNOSIS — Z20822 Contact with and (suspected) exposure to covid-19: Secondary | ICD-10-CM | POA: Diagnosis present

## 2020-04-25 DIAGNOSIS — I420 Dilated cardiomyopathy: Secondary | ICD-10-CM | POA: Diagnosis present

## 2020-04-25 DIAGNOSIS — I482 Chronic atrial fibrillation, unspecified: Secondary | ICD-10-CM | POA: Diagnosis not present

## 2020-04-25 DIAGNOSIS — E871 Hypo-osmolality and hyponatremia: Secondary | ICD-10-CM | POA: Diagnosis not present

## 2020-04-25 DIAGNOSIS — I5023 Acute on chronic systolic (congestive) heart failure: Secondary | ICD-10-CM | POA: Diagnosis present

## 2020-04-25 DIAGNOSIS — I4892 Unspecified atrial flutter: Secondary | ICD-10-CM | POA: Diagnosis not present

## 2020-04-25 DIAGNOSIS — Z01818 Encounter for other preprocedural examination: Secondary | ICD-10-CM

## 2020-04-25 DIAGNOSIS — Z006 Encounter for examination for normal comparison and control in clinical research program: Secondary | ICD-10-CM

## 2020-04-25 DIAGNOSIS — Z79899 Other long term (current) drug therapy: Secondary | ICD-10-CM

## 2020-04-25 DIAGNOSIS — E6609 Other obesity due to excess calories: Secondary | ICD-10-CM | POA: Diagnosis present

## 2020-04-25 DIAGNOSIS — I480 Paroxysmal atrial fibrillation: Secondary | ICD-10-CM | POA: Diagnosis present

## 2020-04-25 DIAGNOSIS — Z95828 Presence of other vascular implants and grafts: Secondary | ICD-10-CM

## 2020-04-25 DIAGNOSIS — I4729 Other ventricular tachycardia: Secondary | ICD-10-CM

## 2020-04-25 DIAGNOSIS — Z950 Presence of cardiac pacemaker: Secondary | ICD-10-CM

## 2020-04-25 DIAGNOSIS — Z794 Long term (current) use of insulin: Secondary | ICD-10-CM

## 2020-04-25 DIAGNOSIS — N179 Acute kidney failure, unspecified: Secondary | ICD-10-CM | POA: Diagnosis not present

## 2020-04-25 DIAGNOSIS — J90 Pleural effusion, not elsewhere classified: Secondary | ICD-10-CM | POA: Diagnosis not present

## 2020-04-25 DIAGNOSIS — I472 Ventricular tachycardia: Secondary | ICD-10-CM | POA: Diagnosis present

## 2020-04-25 DIAGNOSIS — I4821 Permanent atrial fibrillation: Secondary | ICD-10-CM | POA: Diagnosis not present

## 2020-04-25 DIAGNOSIS — I350 Nonrheumatic aortic (valve) stenosis: Secondary | ICD-10-CM | POA: Diagnosis not present

## 2020-04-25 DIAGNOSIS — E782 Mixed hyperlipidemia: Secondary | ICD-10-CM | POA: Diagnosis present

## 2020-04-25 DIAGNOSIS — I5043 Acute on chronic combined systolic (congestive) and diastolic (congestive) heart failure: Secondary | ICD-10-CM | POA: Diagnosis not present

## 2020-04-25 DIAGNOSIS — Z7902 Long term (current) use of antithrombotics/antiplatelets: Secondary | ICD-10-CM | POA: Diagnosis not present

## 2020-04-25 DIAGNOSIS — K8020 Calculus of gallbladder without cholecystitis without obstruction: Secondary | ICD-10-CM | POA: Diagnosis not present

## 2020-04-25 DIAGNOSIS — M1990 Unspecified osteoarthritis, unspecified site: Secondary | ICD-10-CM | POA: Diagnosis present

## 2020-04-25 DIAGNOSIS — N183 Chronic kidney disease, stage 3 unspecified: Secondary | ICD-10-CM

## 2020-04-25 DIAGNOSIS — M79602 Pain in left arm: Secondary | ICD-10-CM | POA: Diagnosis not present

## 2020-04-25 DIAGNOSIS — I700 Atherosclerosis of aorta: Secondary | ICD-10-CM | POA: Diagnosis present

## 2020-04-25 DIAGNOSIS — Z7901 Long term (current) use of anticoagulants: Secondary | ICD-10-CM

## 2020-04-25 DIAGNOSIS — R2232 Localized swelling, mass and lump, left upper limb: Secondary | ICD-10-CM | POA: Diagnosis not present

## 2020-04-25 DIAGNOSIS — R001 Bradycardia, unspecified: Secondary | ICD-10-CM | POA: Diagnosis present

## 2020-04-25 DIAGNOSIS — J811 Chronic pulmonary edema: Secondary | ICD-10-CM | POA: Diagnosis not present

## 2020-04-25 DIAGNOSIS — Z7984 Long term (current) use of oral hypoglycemic drugs: Secondary | ICD-10-CM | POA: Diagnosis not present

## 2020-04-25 DIAGNOSIS — S20219A Contusion of unspecified front wall of thorax, initial encounter: Secondary | ICD-10-CM

## 2020-04-25 DIAGNOSIS — N1832 Chronic kidney disease, stage 3b: Secondary | ICD-10-CM | POA: Diagnosis not present

## 2020-04-25 DIAGNOSIS — Q231 Congenital insufficiency of aortic valve: Secondary | ICD-10-CM | POA: Diagnosis not present

## 2020-04-25 DIAGNOSIS — I517 Cardiomegaly: Secondary | ICD-10-CM | POA: Diagnosis not present

## 2020-04-25 DIAGNOSIS — K7460 Unspecified cirrhosis of liver: Secondary | ICD-10-CM | POA: Diagnosis not present

## 2020-04-25 DIAGNOSIS — I10 Essential (primary) hypertension: Secondary | ICD-10-CM | POA: Diagnosis present

## 2020-04-25 DIAGNOSIS — I351 Nonrheumatic aortic (valve) insufficiency: Secondary | ICD-10-CM | POA: Diagnosis not present

## 2020-04-25 DIAGNOSIS — E1159 Type 2 diabetes mellitus with other circulatory complications: Secondary | ICD-10-CM | POA: Diagnosis present

## 2020-04-25 DIAGNOSIS — Z954 Presence of other heart-valve replacement: Secondary | ICD-10-CM | POA: Diagnosis not present

## 2020-04-25 DIAGNOSIS — K7581 Nonalcoholic steatohepatitis (NASH): Secondary | ICD-10-CM | POA: Diagnosis present

## 2020-04-25 DIAGNOSIS — M109 Gout, unspecified: Secondary | ICD-10-CM | POA: Diagnosis present

## 2020-04-25 DIAGNOSIS — E1122 Type 2 diabetes mellitus with diabetic chronic kidney disease: Secondary | ICD-10-CM | POA: Diagnosis present

## 2020-04-25 DIAGNOSIS — Z87891 Personal history of nicotine dependence: Secondary | ICD-10-CM

## 2020-04-25 DIAGNOSIS — R0602 Shortness of breath: Secondary | ICD-10-CM | POA: Diagnosis not present

## 2020-04-25 DIAGNOSIS — R1013 Epigastric pain: Secondary | ICD-10-CM

## 2020-04-25 DIAGNOSIS — J9811 Atelectasis: Secondary | ICD-10-CM | POA: Diagnosis not present

## 2020-04-25 DIAGNOSIS — I130 Hypertensive heart and chronic kidney disease with heart failure and stage 1 through stage 4 chronic kidney disease, or unspecified chronic kidney disease: Principal | ICD-10-CM | POA: Diagnosis present

## 2020-04-25 DIAGNOSIS — Z952 Presence of prosthetic heart valve: Secondary | ICD-10-CM

## 2020-04-25 DIAGNOSIS — E669 Obesity, unspecified: Secondary | ICD-10-CM | POA: Diagnosis present

## 2020-04-25 DIAGNOSIS — I509 Heart failure, unspecified: Secondary | ICD-10-CM | POA: Diagnosis not present

## 2020-04-25 DIAGNOSIS — Z6837 Body mass index (BMI) 37.0-37.9, adult: Secondary | ICD-10-CM

## 2020-04-25 DIAGNOSIS — Z8249 Family history of ischemic heart disease and other diseases of the circulatory system: Secondary | ICD-10-CM

## 2020-04-25 HISTORY — DX: Nonrheumatic aortic (valve) stenosis: I35.0

## 2020-04-25 LAB — BASIC METABOLIC PANEL
Anion gap: 11 (ref 5–15)
BUN: 16 mg/dL (ref 8–23)
CO2: 26 mmol/L (ref 22–32)
Calcium: 8.7 mg/dL — ABNORMAL LOW (ref 8.9–10.3)
Chloride: 101 mmol/L (ref 98–111)
Creatinine, Ser: 1.66 mg/dL — ABNORMAL HIGH (ref 0.61–1.24)
GFR, Estimated: 43 mL/min — ABNORMAL LOW (ref >60)
Glucose, Bld: 179 mg/dL — ABNORMAL HIGH (ref 70–99)
Potassium: 4.5 mmol/L (ref 3.5–5.1)
Sodium: 138 mmol/L (ref 135–145)

## 2020-04-25 LAB — CBC
HCT: 42.9 % (ref 39.0–52.0)
Hemoglobin: 14.2 g/dL (ref 13.0–17.0)
MCH: 31.3 pg (ref 26.0–34.0)
MCHC: 33.1 g/dL (ref 30.0–36.0)
MCV: 94.5 fL (ref 80.0–100.0)
Platelets: 145 10*3/uL — ABNORMAL LOW (ref 150–400)
RBC: 4.54 MIL/uL (ref 4.22–5.81)
RDW: 18.9 % — ABNORMAL HIGH (ref 11.5–15.5)
WBC: 6.8 10*3/uL (ref 4.0–10.5)
nRBC: 0 % (ref 0.0–0.2)

## 2020-04-25 LAB — GLUCOSE, CAPILLARY
Glucose-Capillary: 84 mg/dL (ref 70–99)
Glucose-Capillary: 90 mg/dL (ref 70–99)

## 2020-04-25 LAB — SARS CORONAVIRUS 2 (TAT 6-24 HRS): SARS Coronavirus 2: NEGATIVE

## 2020-04-25 LAB — MRSA PCR SCREENING: MRSA by PCR: NEGATIVE

## 2020-04-25 LAB — BRAIN NATRIURETIC PEPTIDE: B Natriuretic Peptide: 2410.2 pg/mL — ABNORMAL HIGH (ref 0.0–100.0)

## 2020-04-25 MED ORDER — SODIUM CHLORIDE 0.9% FLUSH: 3.0000 mL | INTRAVENOUS | Status: DC | PRN

## 2020-04-25 MED ORDER — FUROSEMIDE 10 MG/ML IJ SOLN
80.0000 mg | Freq: Two times a day (BID) | INTRAMUSCULAR | Status: DC
  Administered 2020-04-25 – 2020-04-26 (×3): 80 mg via INTRAVENOUS
  Filled 2020-04-25 (×3): qty 8

## 2020-04-25 MED ORDER — SODIUM CHLORIDE 0.9% FLUSH: 10.0000 mL | INTRAVENOUS | Status: DC | PRN

## 2020-04-25 MED ORDER — POTASSIUM CHLORIDE CRYS ER 20 MEQ PO TBCR
20.0000 meq | EXTENDED_RELEASE_TABLET | Freq: Two times a day (BID) | ORAL | Status: DC
  Administered 2020-04-25 – 2020-04-27 (×5): 20 meq via ORAL
  Filled 2020-04-25 (×5): qty 1

## 2020-04-25 MED ORDER — INSULIN ASPART 100 UNIT/ML ~~LOC~~ SOLN: 0.0000 [IU] | Freq: Three times a day (TID) | SUBCUTANEOUS | Status: DC

## 2020-04-25 MED ORDER — APIXABAN 5 MG PO TABS
5.0000 mg | ORAL_TABLET | Freq: Two times a day (BID) | ORAL | Status: DC
  Administered 2020-04-25 – 2020-04-28 (×6): 5 mg via ORAL
  Filled 2020-04-25 (×6): qty 1

## 2020-04-25 MED ORDER — ACETAMINOPHEN 325 MG PO TABS
650.0000 mg | ORAL_TABLET | ORAL | Status: DC | PRN
  Administered 2020-04-26 – 2020-04-27 (×3): 650 mg via ORAL
  Filled 2020-04-25 (×3): qty 2

## 2020-04-25 MED ORDER — SODIUM CHLORIDE 0.9% FLUSH
3.0000 mL | Freq: Two times a day (BID) | INTRAVENOUS | Status: DC
  Administered 2020-04-25 – 2020-05-02 (×5): 3 mL via INTRAVENOUS

## 2020-04-25 MED ORDER — SODIUM CHLORIDE 0.9 % IV SOLN: 250.0000 mL | INTRAVENOUS | Status: DC | PRN

## 2020-04-25 MED ORDER — DAPAGLIFLOZIN PROPANEDIOL 10 MG PO TABS
10.0000 mg | ORAL_TABLET | Freq: Every day | ORAL | Status: DC
  Administered 2020-04-26 – 2020-05-15 (×18): 10 mg via ORAL
  Filled 2020-04-25 (×22): qty 1

## 2020-04-25 MED ORDER — INSULIN DEGLUDEC 200 UNIT/ML ~~LOC~~ SOPN: 30.0000 [IU] | PEN_INJECTOR | Freq: Every day | SUBCUTANEOUS | Status: DC

## 2020-04-25 MED ORDER — CHLORHEXIDINE GLUCONATE CLOTH 2 % EX PADS
6.0000 | MEDICATED_PAD | Freq: Every day | CUTANEOUS | Status: DC
  Administered 2020-04-26 – 2020-05-15 (×19): 6 via TOPICAL

## 2020-04-25 MED ORDER — INSULIN GLARGINE 100 UNIT/ML ~~LOC~~ SOLN
30.0000 [IU] | Freq: Every day | SUBCUTANEOUS | Status: DC
  Administered 2020-04-25: 30 [IU] via SUBCUTANEOUS
  Filled 2020-04-25 (×3): qty 0.3

## 2020-04-25 MED ORDER — SODIUM CHLORIDE 0.9% FLUSH
10.0000 mL | Freq: Two times a day (BID) | INTRAVENOUS | Status: DC
  Administered 2020-04-25 – 2020-05-11 (×28): 10 mL
  Administered 2020-05-12: 20 mL
  Administered 2020-05-12 – 2020-05-15 (×4): 10 mL

## 2020-04-25 NOTE — ED Triage Notes (Signed)
Pt reports orthopnea, swelling to abdomen and legs x 3 months. Reports stopping lasix and just having his legs wrapped. Hx CHF.

## 2020-04-25 NOTE — H&P (Signed)
Error

## 2020-04-25 NOTE — Plan of Care (Signed)
  Problem: Education: Goal: Knowledge of General Education information will improve Description: Including pain rating scale, medication(s)/side effects and non-pharmacologic comfort measures Outcome: Progressing   Problem: Clinical Measurements: Goal: Ability to maintain clinical measurements within normal limits will improve Outcome: Progressing   Problem: Clinical Measurements: Goal: Diagnostic test results will improve Outcome: Progressing   Problem: Clinical Measurements: Goal: Cardiovascular complication will be avoided Outcome: Progressing   Problem: Nutrition: Goal: Adequate nutrition will be maintained Outcome: Progressing   Problem: Pain Managment: Goal: General experience of comfort will improve Outcome: Progressing

## 2020-04-25 NOTE — ED Notes (Signed)
Pt unable to locate for vital recheck

## 2020-04-25 NOTE — Progress Notes (Signed)
Peripherally Inserted Central Catheter Placement  The IV Nurse has discussed with the patient and/or persons authorized to consent for the patient, the purpose of this procedure and the potential benefits and risks involved with this procedure.  The benefits include less needle sticks, lab draws from the catheter, and the patient may be discharged home with the catheter. Risks include, but not limited to, infection, bleeding, blood clot (thrombus formation), and puncture of an artery; nerve damage and irregular heartbeat and possibility to perform a PICC exchange if needed/ordered by physician.  Alternatives to this procedure were also discussed.  Bard Power PICC patient education guide, fact sheet on infection prevention and patient information card has been provided to patient /or left at bedside.    PICC Placement Documentation  PICC Double Lumen 04/25/20 PICC Right Brachial 42 cm 0 cm (Active)  Indication for Insertion or Continuance of Line Vasoactive infusions;Chronic illness with exacerbations (CF, Sickle Cell, etc.) 04/25/20 2121  Exposed Catheter (cm) 0 cm 04/25/20 2121  Site Assessment Clean;Dry;Intact 04/25/20 2121  Lumen #1 Status Flushed;Saline locked;Blood return noted 04/25/20 2121  Lumen #2 Status Flushed;Saline locked;Blood return noted 04/25/20 2121  Dressing Type Transparent 04/25/20 2121  Dressing Status Clean;Dry;Intact 04/25/20 2121  Antimicrobial disc in place? Yes 04/25/20 2121  Safety Lock Not Applicable 94/44/61 9012  Line Care Connections checked and tightened 04/25/20 2121  Line Adjustment (NICU/IV Team Only) No 04/25/20 2121  Dressing Intervention New dressing 04/25/20 2121  Dressing Change Due 05/02/20 04/25/20 2121       Niguel Moure, Nicolette Bang 04/25/2020, 9:22 PM

## 2020-04-25 NOTE — H&P (Addendum)
Advanced Heart Failure Team History and Physical Note   PCP:  Erven Colla, DO  PCP-Cardiology: [name], MD     Reason for Admission: Acute on Chronic Combined systolic and diastolic CHF, Severe Aortic Stenosis   HPI:   75 y/o male w/ chronic combined systolic and diastolic HF 2/2 NICM, G9JM, persistent Afib, CHB s/p PPM, NASH cirrhosis.  Had recent [facility] 9/21 showed no significant coronary disease. Most recent 2D echo 7/21 showed LVEF 30-35%. RV mildly reduced. Also concern for moderate to severe MR. AV also noted to be severely calcified. LVOT/AV VTI ratio: 0.23 by TTE. This was followed by TEE 8/21 which showed only moderate MR and moderate TR. AV was noted to be severely thickened/heavily calcified but no stenosis severity rating.  Also w/ h/o atrial flutter and stage III CKD (baseline SCr ~2), T2DM, HTN and HLD.    Had [place] 9/21 that showed moderately elevated filling pressures and moderately reduced CO. CI was 1.8. Severity of aortic stenosis also assessed by cath. Findings c/w moderate aortic stenosis (mean gradient 21 mmHg, AVA 1.3 cm^2).   Admitted 12/21 for acute hypoxic respiratory failure 2/2 PNA and CHF. Also w/ symptomatic  holelithiasis. He was diuresed w/ IV lasix but then developed AKI, felt over diuresed. SCr bump to 2.6 (baseline 1.5-1.8). Also w/ low BP. PO diuretics ultimately restarted but at lower dose. Torsemide reduced from 40 mg bid to once daily. Spironolactone and metolazone also discontinued. His discharge wt was 227 lb.    Post d/c, he went back to the ED on 12/26 for increased dyspnea and LEE and found to be back in a/c CHF. BNP was >2,000. BMP showed improved SCr, down to 1.65. not felt to require admission. Torsemide was increased back up to 40 mg bid and referred back to general cardiology. He has been limited medical therapy due to CKD and borderline hypotension.   AHF OV on 03/29/20 markedly fluid overloaded w/ 2-3+ pitting edema and JVD to ear. Wt up  over 30 lb from recent hospital wt from 227>>260 lb. NYHA Class IV w/ 3 pillow orthopnea. BP soft, 98/76.  Referred to [facility] for IV Lasix. Recommend 80 mg IV Lasix BID + 2.5 mg of metolazone daily x 3 days, w/ close monitoring of renal function and BP. Coreg stopped. Referred to EP for evaluation of upgrade to BiV pacer, referred to Structural Heart Team for TAVR consideration, & unna boots ordered.   Seen again by our clinic on 04/14/20 at that time was found to have made some progress with diuresis with IV diuretic therapy via [facility] and weight down 21lbs feeling better but still overloaded.  During that time he was also evaluated by structural heart disease who recommended TAVR given advanced age and ordered pre procedure workup.   He was also seen by Dr. Lovena Le who was willing to upgrade to BIV pacer and to discuss timing of procedures with AHF and structural teams and come to a consensus on timing.  Also was able to get a consult from CT surgery team Dr. Roxy Manns who agreed that AS is severe and recommends TAVR given age and comorbidities.    Today he has come to the emergency department with worsening SOB, orthopnea PND and weight gain.   In ER CXR showed pulmonary edema.  Labs stable with creatinine 1.6  BNP 2,410   Previous heart studies:  RHC/LHC 11/2019  -No sig CAD -Mildly to moderately elevated left heart filling pressures (PCWP  22 mmHg, LVEDP 25-30 mmHg). Prominent V waves noted on PCWP tracing consistent with significant mitral regurgitation. -Severely elevated right heart filling pressures (mean RA and RVEDP 16 mmHg). -Moderately reduced Fick cardiac output/index. -Moderate aortic stenosis (mean gradient 21 mmHg, AVA 1.3 cm^2). -PA Mean 30  2D Echo 7/21 1. Left ventricular ejection fraction, by estimation, is 30 to 35%. The  left ventricle has moderately decreased function. The left ventricle  demonstrates global hypokinesis with abnormal septal motion possibly   consistent with pacing. The left  ventricular internal cavity size was mildly dilated. Left ventricular  diastolic parameters are indeterminate.  2. Right ventricular systolic function is mildly reduced. The right  ventricular size is normal. There is mildly elevated pulmonary artery  systolic pressure. The estimated right ventricular systolic pressure is  09.3 mmHg.  3. Left atrial size was moderately dilated.  4. Right atrial size was moderately dilated.  5. The mitral valve is abnormal, mildly thickened and calcified. Moderate  to severe mitral valve regurgitation, eccentric and posteriorly directed.  Not clear that PISA calculations best define degree of regurgitation.  6. The aortic valve has an indeterminant number of cusps and is severely  calcified. Aortic valve mean gradient measures 15.5 mmHg. Cannot exclude  severe low gradient aortic stenosis with dimentionless index of 0.23.  7. The inferior vena cava is dilated in size with <50% respiratory  variability, suggesting right atrial pressure of 15 mmHg.    TEE 8/21 1. Left ventricular ejection fraction, by estimation, is 35 to 40%. The  left ventricle has moderately decreased function. The left ventricle  demonstrates global hypokinesis.  2. Right ventricular systolic function is mildly reduced. The right  ventricular size is mildly enlarged.  3. Left atrial size was moderately dilated. No left atrial/left atrial  appendage thrombus was detected. The LAA emptying velocity was 30 cm/s.  4. Right atrial size was moderately dilated.  5. The mitral valve is abnormal. Moderate mitral valve regurgitation. No  evidence of mitral stenosis.  6. Tricuspid valve regurgitation is moderate.  7. The aortic valve is abnormal. Aortic valve regurgitation is not  visualized.    Review of Systems: [y] = yes, [ ]  = no   General: Weight gain [ y]; Weight loss [ ] ; Anorexia [ ] ; Fatigue [ ] ; Fever [ ] ; Chills [ ] ; Weakness Blue.Reese  ]  Cardiac: Chest pain/pressure [ ] ; Resting SOB [ y]; Exertional SOB [ y]; Orthopnea [ ] ; Pedal Edema [ ] ; Palpitations [ ] ; Syncope [ ] ; Presyncope [ ] ; Paroxysmal nocturnal dyspnea[ ]   Pulmonary: Cough [ ] ; Wheezing[ ] ; Hemoptysis[ ] ; Sputum [ ] ; Snoring [ ]   GI: Vomiting[ ] ; Dysphagia[ ] ; Melena[ ] ; Hematochezia [ ] ; Heartburn[ ] ; Abdominal pain [ ] ; Constipation [ ] ; Diarrhea [ ] ; BRBPR [ ]   GU: Hematuria[ ] ; Dysuria [ ] ; Nocturia[ ]   Vascular: Pain in legs with walking [ ] ; Pain in feet with lying flat [ ] ; Non-healing sores [ ] ; Stroke [ ] ; TIA [ ] ; Slurred speech [ ] ;  Neuro: Headaches[ ] ; Vertigo[ ] ; Seizures[ ] ; Paresthesias[ ] ;Blurred vision [ ] ; Diplopia [ ] ; Vision changes [ ]   Ortho/Skin: Arthritis [ y]; Joint pain [ y]; Muscle pain [ ] ; Joint swelling [ ] ; Back Pain [ ] ; Rash [ ]   Psych: Depression[ ] ; Anxiety[ ]   Heme: Bleeding problems [ ] ; Clotting disorders [ ] ; Anemia Blue.Reese ]  Endocrine: Diabetes Blue.Reese ]; Thyroid dysfunction[ ]    Home Medications Prior to Admission medications  Medication Sig Start Date End Date Taking? Authorizing Provider  ACCU-CHEK AVIVA PLUS test strip TEST BLOOD SUGAR TWICE DAILY BEFORE BREAKFAST AND AT BEDTIME 10/05/19   Cassandria Anger, MD  apixaban (ELIQUIS) 5 MG TABS tablet Take 1 tablet (5 mg total) by mouth 2 (two) times daily. 12/22/19   End, Harrell Gave, MD  benzonatate (TESSALON) 100 MG capsule Take 1 capsule (100 mg total) by mouth 3 (three) times daily as needed for cough. 01/05/20   Evans Lance, MD  dapagliflozin propanediol (FARXIGA) 10 MG TABS tablet Take 1 tablet (10 mg total) by mouth daily before breakfast. 04/15/20   [place]ford, Maricela Bo, FNP  HYDROcodone-acetaminophen (NORCO/VICODIN) 5-325 MG tablet Take one tablet po every 4 hrs prn pain Patient taking differently: Take by mouth as needed. Take one tablet po every 4 hrs prn pain 10/10/18   Mikey Kirschner, MD  insulin degludec (TRESIBA FLEXTOUCH) 200 UNIT/ML FlexTouch Pen Inject 50  Units into the skin at bedtime. 04/05/20   Cassandria Anger, MD  Insulin Pen Needle (BD PEN NEEDLE NANO U/F) 32G X 4 MM MISC 1 each by Does not apply route 4 (four) times daily. Patient taking differently: 1 each by Does not apply route once. 12/23/18   Cassandria Anger, MD  metoprolol tartrate (LOPRESSOR) 50 MG tablet Take as directed prior to 1/26 CT scan 04/15/20   Burnell Blanks, MD  Multiple Vitamin (MULTIVITAMIN WITH MINERALS) TABS tablet Take 1 tablet by mouth daily. One A Day for Men    [provider]  potassium chloride SA (KLOR-CON) 20 MEQ tablet Take 3 tablets (60 mEq total) by mouth 2 (two) times daily. 04/15/20   Rafael Bihari, FNP  torsemide (DEMADEX) 20 MG tablet Take 2 tablets (40 mg total) by mouth daily. TAKE 2 TABLETS (40 MG TOTAL) BY MOUTH 2 (TWO) TIMES DAILY. 03/14/20   Orson Eva, MD  TRULICITY 1.5 UV/2.5DG SOPN INJECT 1.5MG (1 PEN) SUBCUTANEOUSLY EVERY WEEK 04/06/20   Cassandria Anger, MD  Zinc 50 MG TABS Take 50 mg by mouth daily.    [provider]    Past Medical History: Past Medical History:  Diagnosis Date  . Atrial flutter ([place]) 12/2010   Admitted with symptomatic bradycardia (HR 40s), atrial flutter with slow ventricular response 12/2010 + volume overload; AV nodal agents d/c'd and Pradaxa started; RFA in 01/2011  . CHF (congestive heart failure) ([place])   . Chronic combined systolic and diastolic heart failure ([place])    a. echo 01/06/11: mild LVH, EF 65-70%, mod to severe LAE, mild RVE, mild RAE, PASP 32;   TEE 10/12: EF 45-50% b. EF 45 to 50% by echo in 07/2018  . Class 2 severe obesity due to excess calories with serious comorbidity and body mass index (BMI) of 37.0 to 37.9 in adult ([place]) 07/17/2017  . Diabetes mellitus    non insulin dependant  . Hyperlipidemia   . Hypertension   . Osteoarthritis   . Presence of permanent cardiac pacemaker   . PSVT (paroxysmal supraventricular tachycardia) ([facility])    Possibly atrial  flutter    Past Surgical History: Past Surgical History:  Procedure Laterality Date  . ATRIAL FLUTTER ABLATION N/A 02/07/2011   Procedure: ATRIAL FLUTTER ABLATION;  Surgeon: Evans Lance, MD;  Location: [facility] CATH LAB;  Service: Cardiovascular;  Laterality: N/A;  . CARDIAC ELECTROPHYSIOLOGY STUDY AND ABLATION  02/07/11  . CARDIOVERSION N/A 03/23/2016   Procedure: CARDIOVERSION;  Surgeon: Evans Lance, MD;  Location:  [facility] ENDOSCOPY;  Service: Cardiovascular;  Laterality: N/A;  . COLONOSCOPY N/A 04/17/2017   Procedure: COLONOSCOPY;  Surgeon: Rogene Houston, MD;  Location: [facility] ENDO SU[facility];  Service: Endoscopy;  Laterality: N/A;  830  . EP IMPLANTABLE DEVICE N/A 08/29/2015   Procedure: Pacemaker Implant;  Surgeon: Evans Lance, MD;  Location: [place] CV LAB;  Service: Cardiovascular;  Laterality: N/A;  . EP IMPLANTABLE DEVICE N/A 12/07/2015   Procedure: PPM Lead Revision/Repair;  Surgeon: Evans Lance, MD;  Location: [facility] CV LAB;  Service: Cardiovascular;  Laterality: N/A;  . KNEE ARTHROSCOPY  2005   left  . LUMBAR SPINE SURGERY     "I've had 6 ORs 1972 thru 2004"  . POLYPECTOMY  04/17/2017   Procedure: POLYPECTOMY;  Surgeon: Rogene Houston, MD;  Location: [facility] ENDO SU[facility];  Service: Endoscopy;;  transverse colon x3;  . RIGHT/LEFT HEART CATH AND CORONARY ANGIOGRAPHY N/A 12/21/2019   Procedure: RIGHT/LEFT HEART CATH AND CORONARY ANGIOGRAPHY;  Surgeon: Nelva Bush, MD;  Location: [place] CV LAB;  Service: Cardiovascular;  Laterality: N/A;  . TEE WITHOUT CARDIOVERSION N/A 11/05/2019   Procedure: TRANSESOPHAGEAL ECHOCARDIOGRAM (TEE) WITH PROPOFOL;  Surgeon: Arnoldo Lenis, MD;  Location: [facility];  Service: Endoscopy;  Laterality: N/A;    Family History: Family History  Problem Relation Age of Onset  . Heart attack Mother   . Hypertension Mother   . Heart attack Father   . Hypertension Father   . Heart attack Brother   . Colon cancer Neg Hx     Social  History: Social History   Socioeconomic History  . Marital status: Married    Spouse name: Not on file  . Number of children: 2  . Years of education: Not on file  . Highest education level: Not on file  Occupational History  . Occupation: Immunologist: RETIRED  Tobacco Use  . Smoking status: Former Smoker    Packs/day: 1.00    Years: 10.00    Pack years: 10.00    Types: Cigarettes    Quit date: 03/26/1986    Years since quitting: 34.1  . Smokeless tobacco: Never Used  . Tobacco comment: "stopped cigarette  smoking 1988"  Vaping Use  . Vaping Use: Never used  Substance and Sexual Activity  . Alcohol use: Not Currently    Alcohol/week: 0.0 standard drinks    Comment: "quit alcohol ~ 2007"  . Drug use: No  . Sexual activity: Yes    Partners: Female  Other Topics Concern  . Not on file  Social History Narrative  . Not on file   Social Determinants of Health   Financial Resource Strain: Not on file  Food Insecurity: Not on file  Transportation Needs: Not on file  Physical Activity: Not on file  Stress: Not on file  Social Connections: Not on file    Allergies:  No Known Allergies  Objective:    Vital Signs:   Temp:  [97.8 F (36.6 C)] 97.8 F (36.6 C) (01/31 1143) Pulse Rate:  [67-80] 69 (01/31 1718) Resp:  [18-20] 18 (01/31 1718) BP: (101-114)/(63-73) 101/69 (01/31 1718) SpO2:  [97 %-100 %] 97 % (01/31 1718) Weight:  [103.4 kg] 103.4 kg (01/31 1143)    Physical Exam     General:  Elderly male. Mild SOB HEENT: normal Neck: supple. JVP to jaw . Carotids 2+ bilat; + bruits. No lymphadenopathy or thryomegaly appreciated. Cor: PMI nondisplaced. Irregular rate & rhythm. No rubs,  gallops or murmurs. Lungs: clear Abdomen: soft, nontender, + distended. No hepatosplenomegaly. No bruits or masses. Good bowel sounds. Extremities: no cyanosis, clubbing, rash, 2-3+ edema Neuro: alert & orientedx3, cranial nerves grossly intact. moves all 4  extremities w/o difficulty. Affect pleasant   EKG   AF with v-pacing and frequent PVCs/bigeminy Personally reviewed   Labs     Basic Metabolic Panel: Recent Labs  Lab 04/20/20 0904 04/25/20 1157  NA 135 138  K 4.8 4.5  CL 95* 101  CO2 29 26  GLUCOSE 127* 179*  BUN 23 16  CREATININE 1.67* 1.66*  CALCIUM 8.9 8.7*    Liver Function Tests: No results for input(s): AST, ALT, ALKPHOS, BILITOT, PROT, ALBUMIN in the last 168 hours. No results for input(s): LIPASE, AMYLASE in the last 168 hours. No results for input(s): AMMONIA in the last 168 hours.  CBC: Recent Labs  Lab 04/25/20 1157  WBC 6.8  HGB 14.2  HCT 42.9  MCV 94.5  PLT 145*    Cardiac Enzymes: No results for input(s): CKTOTAL, CKMB, CKMBINDEX, TROPONINI in the last 168 hours.  BNP: BNP (last 3 results) Recent Labs    03/09/20 1544 03/20/20 1253 04/25/20 1157  BNP 1,540.0* 2,072.0* 2,410.2*    ProBNP (last 3 results) No results for input(s): PROBNP in the last 8760 hours.   CBG: No results for input(s): GLUCAP in the last 168 hours.  Coagulation Studies: No results for input(s): LABPROT, INR in the last 72 hours.  Imaging: DG Chest 2 View  Result Date: 04/25/2020 CLINICAL DATA:  Shortness of breath for 6 months EXAM: CHEST - 2 VIEW COMPARISON:  03/20/2020 FINDINGS: Left-sided implanted cardiac device. Stable cardiomegaly. Atherosclerotic calcification of the aortic knob. Mild pulmonary vascular congestion with mild diffuse interstitial prominence. No pleural effusion or pneumothorax. IMPRESSION: Cardiomegaly with mild pulmonary vascular congestion and diffuse interstitial prominence, suggesting CHF with mild edema. Electronically Signed   By: Davina Poke D.O.   On: 04/25/2020 12:24     Assessment/Plan   1. Acute on Chronic Combined Systolic and Diastolic Heart Failure/NICM: - Echo 08/2015 EF 50-55% (PPM placed in 08/2015)  - Echo 10/2015 EF dropped to 40-45% - Echo 2018 EF 45% - Echo  2021 EF 30-35%, RV mildly reduced  - based on timing of drop in EF and 42% pacing percentage, concern for RV paced CM. Aortic valve also appears severely calcified and moderate-severely stenosed, suspect AS also contributing to CM. Had recent [facility] 9/21 that showed no coronary disease.  - He is volume overloaded. Suspect PVCs may also be contributing to symptoms. Will admit. Begin IV lasix. Place PICC to check co-ox. May need inotropes. Once tune up will need TAVR and CRT upgrade (order to be determined).    2. Mitral Regurgitation  - Moderate by TEE 8/21.  - Likely functional from dilated CM.    3. Aortic Stenosis  - severe low flow/low gradient aortic stenosis with mean gradient 15.5 mmHg, dimensionless index 0.23, AVA 0.86 cm2  - Reviewed most recent 2D echo. AV appears very calcified and stenotic. Suspect contributing to systolic HF. - followed by structural heart team and CT surgery recommendation is TAVR when optimized. They are aware of admission and will review at meeting in am    4. Atrial Flutter/ Chronic Atrial Fibrillation - s/p AFL ablation.  - In chronic Afib, rate controlled.  - Continue Eliquis. No bleeding issues. - continue holding bb   5. H/o CHB  - s/p PPM followed by Dr.  Lovena Le  - ? RV paced CM. He is pacing ~40%. - Will optimize patient's volume and discuss with EP BIV upgrade  6. NASH cirrhosis -followed by Dr. Jenetta Downer, NAFLD socre 2.90 consistent with advanced fibrosis -Elastography ordered but not yet complete -no history of bleeding or known varices  7. Stage IIIb CKD - Followed by nephrology.  - Baseline SCr ~1.5-1.8 - Creatinine stable 1.6 monitor renal fx closely with diuresis   8. T2DM - On insulin. a1C 6.8 (12/21) - On farxiga - cover with SSI   Glori Bickers, MD 04/25/2020, 5:49 PM  Advanced Heart Failure Team Pager [phone] (M-F; 7a - 4p)  Please contact [facility] for night-coverage after hours (4p -7a ) and weekends on  [email or web address]

## 2020-04-25 NOTE — ED Notes (Signed)
After multiple attempts to locate patient, this RN called home phone number. Pt at home with wife, advised by this RN to come back to hospital as heart failure team is here to admit him. Wife sts she will bring him back to the ED a little later.

## 2020-04-26 ENCOUNTER — Inpatient Hospital Stay (HOSPITAL_COMMUNITY): Payer: Medicare HMO

## 2020-04-26 ENCOUNTER — Encounter (HOSPITAL_COMMUNITY): Payer: Self-pay | Admitting: Internal Medicine

## 2020-04-26 DIAGNOSIS — N1832 Chronic kidney disease, stage 3b: Secondary | ICD-10-CM | POA: Diagnosis not present

## 2020-04-26 DIAGNOSIS — I351 Nonrheumatic aortic (valve) insufficiency: Secondary | ICD-10-CM

## 2020-04-26 DIAGNOSIS — I350 Nonrheumatic aortic (valve) stenosis: Secondary | ICD-10-CM

## 2020-04-26 DIAGNOSIS — I5023 Acute on chronic systolic (congestive) heart failure: Secondary | ICD-10-CM | POA: Diagnosis not present

## 2020-04-26 DIAGNOSIS — I482 Chronic atrial fibrillation, unspecified: Secondary | ICD-10-CM | POA: Diagnosis not present

## 2020-04-26 LAB — ECHOCARDIOGRAM COMPLETE
AR max vel: 0.79 cm2
AV Area VTI: 0.87 cm2
AV Area mean vel: 0.79 cm2
AV Mean grad: 21 mmHg
AV Peak grad: 39.2 mmHg
Ao pk vel: 3.13 m/s
Area-P 1/2: 4.21 cm2
Calc EF: 39.2 %
Height: 72 in
MV M vel: 4.83 m/s
MV Peak grad: 93.3 mmHg
Radius: 0.8 cm
S' Lateral: 4.9 cm
Single Plane A2C EF: 39.4 %
Single Plane A4C EF: 38.3 %
Weight: 3763.69 oz

## 2020-04-26 LAB — BASIC METABOLIC PANEL
Anion gap: 10 (ref 5–15)
BUN: 15 mg/dL (ref 8–23)
CO2: 26 mmol/L (ref 22–32)
Calcium: 8.7 mg/dL — ABNORMAL LOW (ref 8.9–10.3)
Chloride: 100 mmol/L (ref 98–111)
Creatinine, Ser: 1.44 mg/dL — ABNORMAL HIGH (ref 0.61–1.24)
GFR, Estimated: 51 mL/min — ABNORMAL LOW (ref >60)
Glucose, Bld: 77 mg/dL (ref 70–99)
Potassium: 3.9 mmol/L (ref 3.5–5.1)
Sodium: 136 mmol/L (ref 135–145)

## 2020-04-26 LAB — COOXEMETRY PANEL
Carboxyhemoglobin: 1.7 % — ABNORMAL HIGH (ref 0.5–1.5)
Methemoglobin: 0.7 % (ref 0.0–1.5)
O2 Saturation: 59.2 %
Total hemoglobin: 13.8 g/dL (ref 12.0–16.0)

## 2020-04-26 LAB — GLUCOSE, CAPILLARY
Glucose-Capillary: 72 mg/dL (ref 70–99)
Glucose-Capillary: 88 mg/dL (ref 70–99)
Glucose-Capillary: 82 mg/dL (ref 70–99)
Glucose-Capillary: 86 mg/dL (ref 70–99)

## 2020-04-26 NOTE — Progress Notes (Signed)
Advanced Heart Failure Rounding Note   Subjective:    Diuresing well on IV lasix. Weight up nearly 20 pounds. Breathing better. No orthopnea or PND. PICC placed. Co-ox 59%  Creatinine down 1.6-> 1.4   Objective:   Weight Range:  Vital Signs:   Temp:  [97.6 F (36.4 C)-97.9 F (36.6 C)] 97.9 F (36.6 C) (02/01 0301) Pulse Rate:  [65-80] 78 (02/01 0301) Resp:  [12-20] 15 (02/01 0301) BP: (101-115)/(63-82) 115/72 (02/01 0301) SpO2:  [97 %-100 %] 99 % (02/01 0301) Weight:  [103.4 kg] 103.4 kg (01/31 1143)    Weight change: There were no vitals filed for this visit.  Intake/Output:   Intake/Output Summary (Last 24 hours) at 04/26/2020 0401 Last data filed at 04/26/2020 0301 Gross per 24 hour  Intake 680 ml  Output 1700 ml  Net -1020 ml     Physical Exam: General:  Lying in bedNo resp difficulty HEENT: normal Neck: supple. JVP to jaw. Carotids 2+ bilat; + bruits. No lymphadenopathy or thryomegaly appreciated. Cor: PMI nondisplaced. Regular rate & rhythm. S2 absent Lungs: clear Abdomen: soft, nontender, nondistended. No hepatosplenomegaly. No bruits or masses. Good bowel sounds. Extremities: no cyanosis, clubbing, rash, 2-3+ edema + UNNA Neuro: alert & orientedx3, cranial nerves grossly intact. moves all 4 extremities w/o difficulty. Affect pleasant  Telemetry: AF v paced. PVCs less Personally reviewed   Labs: Basic Metabolic Panel: Recent Labs  Lab 04/20/20 0904 04/25/20 1157 04/26/20 0308  NA 135 138 136  K 4.8 4.5 3.9  CL 95* 101 100  CO2 29 26 26   GLUCOSE 127* 179* 77  BUN 23 16 15   CREATININE 1.67* 1.66* 1.44*  CALCIUM 8.9 8.7* 8.7*    Liver Function Tests: No results for input(s): AST, ALT, ALKPHOS, BILITOT, PROT, ALBUMIN in the last 168 hours. No results for input(s): LIPASE, AMYLASE in the last 168 hours. No results for input(s): AMMONIA in the last 168 hours.  CBC: Recent Labs  Lab 04/25/20 1157  WBC 6.8  HGB 14.2  HCT 42.9  MCV 94.5   PLT 145*    Cardiac Enzymes: No results for input(s): CKTOTAL, CKMB, CKMBINDEX, TROPONINI in the last 168 hours.  BNP: BNP (last 3 results) Recent Labs    03/09/20 1544 03/20/20 1253 04/25/20 1157  BNP 1,540.0* 2,072.0* 2,410.2*    ProBNP (last 3 results) No results for input(s): PROBNP in the last 8760 hours.    Other results:  Imaging: DG Chest 2 View  Result Date: 04/25/2020 CLINICAL DATA:  Shortness of breath for 6 months EXAM: CHEST - 2 VIEW COMPARISON:  03/20/2020 FINDINGS: Left-sided implanted cardiac device. Stable cardiomegaly. Atherosclerotic calcification of the aortic knob. Mild pulmonary vascular congestion with mild diffuse interstitial prominence. No pleural effusion or pneumothorax. IMPRESSION: Cardiomegaly with mild pulmonary vascular congestion and diffuse interstitial prominence, suggesting CHF with mild edema. Electronically Signed   By: Davina Poke D.O.   On: 04/25/2020 12:24   DG CHEST PORT 1 VIEW  Result Date: 04/25/2020 CLINICAL DATA:  PICC placement. EXAM: PORTABLE CHEST 1 VIEW COMPARISON:  Radiograph earlier today.  CT 04/20/2020 FINDINGS: Right upper extremity PICC tip in the mid SVC. No pneumothorax. Left-sided pacemaker in place. Stable cardiomegaly. Right pleural effusion appears similar to prior. Underlying interstitial opacities suspicious for pulmonary edema, also unchanged. Stable osseous structures. IMPRESSION: 1. Right upper extremity PICC tip in the mid SVC. No pneumothorax. 2. Stable cardiomegaly, pulmonary edema and right pleural effusion. Electronically Signed   By: Aurther Loft.D.  On: 04/25/2020 21:43   [place] EKG SITE RITE  Result Date: 04/25/2020 If Site Rite image not attached, placement could not be confirmed due to current cardiac rhythm.     Medications:     Scheduled Medications: . apixaban  5 mg Oral BID  . Chlorhexidine Gluconate Cloth  6 each Topical Daily  . dapagliflozin propanediol  10 mg Oral QAC breakfast   . furosemide  80 mg Intravenous BID  . insulin aspart  0-15 Units Subcutaneous TID WC  . insulin glargine  30 Units Subcutaneous QHS  . potassium chloride SA  20 mEq Oral BID  . sodium chloride flush  10-40 mL Intracatheter Q12H  . sodium chloride flush  3 mL Intravenous Q12H     Infusions: . sodium chloride       PRN Medications:  sodium chloride, acetaminophen, sodium chloride flush, sodium chloride flush   Assessment/Plan:   1. Acute on Chronic Combined Systolic and Diastolic Heart Failure/NICM: - Echo 08/2015 EF 50-55% (PPM placed in 08/2015)  - Echo 10/2015 EF dropped to 40-45% - Echo 2018 EF 45% - Echo 2021 EF 30-35%, RV mildly reduced  - based on timing of drop in EF and 42% pacing percentage, concern for RV paced CM. Aortic valve also appears severely calcified and moderate-severely stenosed, suspect AS also contributing to CM. Had recent [facility] 9/21 that showed no coronary disease.  - He is volume overloaded. CVP 20, Co-ox 59% - Continue IV lasix.  - Once tune up will need TAVR and CRT upgrade (order to be determined). Suspect TAVR first. Will be discussed at TAVR metting today  2. Mitral Regurgitation  - Moderate by TEE 8/21. -Likely functional from dilated CM.  3. Aortic Stenosis  - severe low flow/low gradient aortic stenosis with mean gradient 15.5 mmHg, dimensionless index 0.23, AVA 0.86 cm2  -Reviewed most recent 2D echo. AV appears very calcified and stenotic. Suspect contributing to systolic HF. -followed by structural heart team and CT surgery recommendation is TAVR when optimized.   4. Atrial Flutter/ Chronic Atrial Fibrillation - s/p AFL ablation. -In chronic Afib, rate controlled. -Continue Eliquis. No bleeding issues. Will need to switch to heparin prior to TAVR - continue holding bb  5. H/o CHB  - s/p PPM followed by Dr. Lovena Le  - ? RV paced CM. He is pacing ~40%. - Will optimize patient's volume and discuss with EP BIV upgrade  6.  NASH cirrhosis -followed by Dr. Jenetta Downer, NAFLD socre 2.90 consistent with advanced fibrosis -Elastography ordered but not yet complete -no history of bleeding or known varices  7. Stage IIIb CKD -Followed by nephrology. -Baseline SCr ~1.5-1.8 - Creatinine stable 1.6 -> 1.4 today monitor renal fx closely with diuresis  8. T2DM -On insulin.a1C 6.8 (12/21) -On farxiga - cover with SSI.   Length of Stay: 1   Glori Bickers MD 04/26/2020, 4:01 AM  Advanced Heart Failure Team Pager [phone] (M-F; 7a - 4p)  Please contact [facility] for night-coverage after hours (4p -7a ) and weekends on [email or web address]

## 2020-04-26 NOTE — Progress Notes (Signed)
CBG has been 70s-80s during a day and also, last CBG was 86 at 2117. Lantus 30 units are held tonight.

## 2020-04-26 NOTE — Progress Notes (Signed)
Echocardiogram 2D Echocardiogram has been performed.  Oneal Deputy Mialynn Shelvin 04/26/2020, 11:28 AM

## 2020-04-27 DIAGNOSIS — I482 Chronic atrial fibrillation, unspecified: Secondary | ICD-10-CM | POA: Diagnosis not present

## 2020-04-27 DIAGNOSIS — N1832 Chronic kidney disease, stage 3b: Secondary | ICD-10-CM | POA: Diagnosis not present

## 2020-04-27 DIAGNOSIS — I5043 Acute on chronic combined systolic (congestive) and diastolic (congestive) heart failure: Secondary | ICD-10-CM

## 2020-04-27 DIAGNOSIS — I350 Nonrheumatic aortic (valve) stenosis: Secondary | ICD-10-CM | POA: Diagnosis not present

## 2020-04-27 LAB — GLUCOSE, CAPILLARY
Glucose-Capillary: 67 mg/dL — ABNORMAL LOW (ref 70–99)
Glucose-Capillary: 82 mg/dL (ref 70–99)
Glucose-Capillary: 110 mg/dL — ABNORMAL HIGH (ref 70–99)
Glucose-Capillary: 160 mg/dL — ABNORMAL HIGH (ref 70–99)
Glucose-Capillary: 202 mg/dL — ABNORMAL HIGH (ref 70–99)

## 2020-04-27 LAB — BASIC METABOLIC PANEL
Anion gap: 10 (ref 5–15)
BUN: 14 mg/dL (ref 8–23)
CO2: 25 mmol/L (ref 22–32)
Calcium: 7.8 mg/dL — ABNORMAL LOW (ref 8.9–10.3)
Chloride: 102 mmol/L (ref 98–111)
Creatinine, Ser: 1.56 mg/dL — ABNORMAL HIGH (ref 0.61–1.24)
GFR, Estimated: 46 mL/min — ABNORMAL LOW (ref >60)
Glucose, Bld: 70 mg/dL (ref 70–99)
Potassium: 4.1 mmol/L (ref 3.5–5.1)
Sodium: 137 mmol/L (ref 135–145)

## 2020-04-27 LAB — COOXEMETRY PANEL
Carboxyhemoglobin: 2.4 % — ABNORMAL HIGH (ref 0.5–1.5)
Carboxyhemoglobin: 2.1 % — ABNORMAL HIGH (ref 0.5–1.5)
Methemoglobin: 0.7 % (ref 0.0–1.5)
Methemoglobin: 0.7 % (ref 0.0–1.5)
O2 Saturation: 90.4 %
O2 Saturation: 54.9 %
Total hemoglobin: 10.6 g/dL — ABNORMAL LOW (ref 12.0–16.0)
Total hemoglobin: 9.9 g/dL — ABNORMAL LOW (ref 12.0–16.0)

## 2020-04-27 MED ORDER — INSULIN ASPART 100 UNIT/ML ~~LOC~~ SOLN
0.0000 [IU] | Freq: Three times a day (TID) | SUBCUTANEOUS | Status: DC
  Administered 2020-04-27 (×2): 2 [IU] via SUBCUTANEOUS
  Administered 2020-04-28 (×2): 1 [IU] via SUBCUTANEOUS
  Administered 2020-04-28: 3 [IU] via SUBCUTANEOUS
  Administered 2020-04-29: 2 [IU] via SUBCUTANEOUS
  Administered 2020-04-29: 1 [IU] via SUBCUTANEOUS
  Administered 2020-04-29: 2 [IU] via SUBCUTANEOUS
  Administered 2020-04-30: 3 [IU] via SUBCUTANEOUS
  Administered 2020-04-30: 11:00:00 5 [IU] via SUBCUTANEOUS
  Administered 2020-05-01: 12:00:00 3 [IU] via SUBCUTANEOUS
  Administered 2020-05-01: 5 [IU] via SUBCUTANEOUS
  Administered 2020-05-01 – 2020-05-02 (×2): 1 [IU] via SUBCUTANEOUS
  Administered 2020-05-02: 3 [IU] via SUBCUTANEOUS
  Administered 2020-05-02 – 2020-05-04 (×2): 2 [IU] via SUBCUTANEOUS
  Administered 2020-05-04: 3 [IU] via SUBCUTANEOUS
  Administered 2020-05-05: 1 [IU] via SUBCUTANEOUS
  Administered 2020-05-05 (×2): 3 [IU] via SUBCUTANEOUS
  Administered 2020-05-06: 1 [IU] via SUBCUTANEOUS
  Administered 2020-05-06: 3 [IU] via SUBCUTANEOUS
  Administered 2020-05-06 – 2020-05-07 (×2): 2 [IU] via SUBCUTANEOUS
  Administered 2020-05-07: 3 [IU] via SUBCUTANEOUS
  Administered 2020-05-07: 1 [IU] via SUBCUTANEOUS
  Administered 2020-05-08 – 2020-05-09 (×4): 2 [IU] via SUBCUTANEOUS
  Administered 2020-05-09: 3 [IU] via SUBCUTANEOUS
  Administered 2020-05-09: 2 [IU] via SUBCUTANEOUS
  Administered 2020-05-10: 1 [IU] via SUBCUTANEOUS
  Administered 2020-05-10 (×2): 5 [IU] via SUBCUTANEOUS
  Administered 2020-05-10: 1 [IU] via SUBCUTANEOUS
  Administered 2020-05-11: 7 [IU] via SUBCUTANEOUS

## 2020-04-27 MED ORDER — INSULIN GLARGINE 100 UNIT/ML ~~LOC~~ SOLN
10.0000 [IU] | Freq: Every day | SUBCUTANEOUS | Status: DC
  Filled 2020-04-27: qty 0.1

## 2020-04-27 MED ORDER — FUROSEMIDE 10 MG/ML IJ SOLN
80.0000 mg | Freq: Two times a day (BID) | INTRAMUSCULAR | Status: DC
  Administered 2020-04-27 (×2): 80 mg via INTRAVENOUS
  Filled 2020-04-27 (×2): qty 8

## 2020-04-27 MED ORDER — INSULIN GLARGINE 100 UNIT/ML ~~LOC~~ SOLN
5.0000 [IU] | Freq: Every day | SUBCUTANEOUS | Status: DC
  Administered 2020-04-27 – 2020-05-10 (×12): 5 [IU] via SUBCUTANEOUS
  Filled 2020-04-27 (×15): qty 0.05

## 2020-04-27 MED ORDER — DICLOFENAC SODIUM 1 % EX GEL
4.0000 g | Freq: Four times a day (QID) | CUTANEOUS | Status: DC | PRN
  Administered 2020-04-27: 4 g via TOPICAL
  Filled 2020-04-27: qty 100

## 2020-04-27 NOTE — Progress Notes (Addendum)
[place] VALVE TEAM  Patient Name: JAYME CHAM Date of Encounter: 04/27/2020  Primary Cardiologist: Dr. Roe Coombs Problem List     Active Problems:   CKD (chronic kidney disease) stage 3, GFR 30-59 ml/min (HCC)   Severe aortic stenosis   Chronic atrial fibrillation (HCC)   Acute on chronic systolic CHF (congestive heart failure) ([place])     Subjective   Feels the best he has in years. No complaints this AM.   Inpatient Medications    Scheduled Meds: . apixaban  5 mg Oral BID  . Chlorhexidine Gluconate Cloth  6 each Topical Daily  . dapagliflozin propanediol  10 mg Oral QAC breakfast  . furosemide  80 mg Intravenous BID  . insulin aspart  0-9 Units Subcutaneous TID WC  . insulin glargine  5 Units Subcutaneous QHS  . potassium chloride SA  20 mEq Oral BID  . sodium chloride flush  10-40 mL Intracatheter Q12H  . sodium chloride flush  3 mL Intravenous Q12H   Continuous Infusions: . sodium chloride     PRN Meds: sodium chloride, acetaminophen, diclofenac Sodium, sodium chloride flush, sodium chloride flush   Vital Signs    Vitals:   04/27/20 0750 04/27/20 0806 04/27/20 1031 04/27/20 1553  BP: 108/61  104/79 (!) 101/57  Pulse: 81  65 63  Resp: 17 18 20 18   Temp: 97.6 F (36.4 C)  (!) 97.5 F (36.4 C) (!) 97.4 F (36.3 C)  TempSrc: Oral  Oral Oral  SpO2:    99%  Weight:        Intake/Output Summary (Last 24 hours) at 04/27/2020 1737 Last data filed at 04/27/2020 1700 Gross per 24 hour  Intake 1000 ml  Output 1560 ml  Net -560 ml   Filed Weights   04/26/20 0301 04/27/20 0507  Weight: 106.7 kg 105.8 kg    Physical Exam    GEN: Well nourished, well developed, in no acute distress.  HEENT: Grossly normal.  Neck: Supple, no JVD, carotid bruits, or masses. Cardiac: irreg irreg.2/6 SEM  No rubs, or gallops. No clubbing, cyanosis, edema.   Respiratory:  Respirations regular and unlabored, clear to  auscultation bilaterally. GI: Soft, nontender, nondistended, BS + x 4. MS: no deformity or atrophy. Skin: warm and dry, no rash. Leg wrapped Neuro:  Strength and sensation are intact. Psych: AAOx3.  Normal affect.  Labs    CBC Recent Labs    04/25/20 1157  WBC 6.8  HGB 14.2  HCT 42.9  MCV 94.5  PLT 409*   Basic Metabolic Panel Recent Labs    04/26/20 0308 04/27/20 0500  NA 136 137  K 3.9 4.1  CL 100 102  CO2 26 25  GLUCOSE 77 70  BUN 15 14  CREATININE 1.44* 1.56*  CALCIUM 8.7* 7.8*   Liver Function Tests No results for input(s): AST, ALT, ALKPHOS, BILITOT, PROT, ALBUMIN in the last 72 hours. No results for input(s): LIPASE, AMYLASE in the last 72 hours. Cardiac Enzymes No results for input(s): CKTOTAL, CKMB, CKMBINDEX, TROPONINI in the last 72 hours. BNP Invalid input(s): POCBNP D-Dimer No results for input(s): DDIMER in the last 72 hours. Hemoglobin A1C No results for input(s): HGBA1C in the last 72 hours. Fasting Lipid Panel No results for input(s): CHOL, HDL, LDLCALC, TRIG, CHOLHDL, LDLDIRECT in the last 72 hours. Thyroid Function Tests No results for input(s): TSH, T4TOTAL, T3FREE, THYROIDAB in the last 72 hours.  Invalid input(s): FREET3  Telemetry    afib with paced beats and PVCs- Personally Reviewed  ECG    affib with intermittent paced beats, RBBB, HR 79- Personally Reviewed  Radiology    DG CHEST PORT 1 VIEW  Result Date: 04/25/2020 CLINICAL DATA:  PICC placement. EXAM: PORTABLE CHEST 1 VIEW COMPARISON:  Radiograph earlier today.  CT 04/20/2020 FINDINGS: Right upper extremity PICC tip in the mid SVC. No pneumothorax. Left-sided pacemaker in place. Stable cardiomegaly. Right pleural effusion appears similar to prior. Underlying interstitial opacities suspicious for pulmonary edema, also unchanged. Stable osseous structures. IMPRESSION: 1. Right upper extremity PICC tip in the mid SVC. No pneumothorax. 2. Stable cardiomegaly, pulmonary edema and  right pleural effusion. Electronically Signed   By: Keith Rake M.D.   On: 04/25/2020 21:43   ECHOCARDIOGRAM COMPLETE  Result Date: 04/26/2020    ECHOCARDIOGRAM REPORT   Patient Name:   EDWORD CU Date of Exam: 04/26/2020 Medical Rec #:  [id number]        Height:       72.0 in Accession #:    [id number]       Weight:       235.2 lb Date of Birth:  06/22/1945        BSA:          2.283 m Patient Age:    75 years         BP:           122/76 mmHg Patient Gender: M                HR:           67 bpm. Exam Location:  Inpatient Procedure: 2D Echo, Color Doppler and Cardiac Doppler Indications:    Aortic Stenosis i35.0  History:        Patient has prior history of Echocardiogram examinations, most                 recent 11/06/2019. CHF, Pacemaker, Arrythmias:Atrial                 Fibrillation; Risk Factors:Hypertension, Diabetes and                 Dyslipidemia.  Sonographer:    Raquel Sarna Senior RDCS Referring Phys: [id number] DANIEL R BENSIMHON IMPRESSIONS  1. Left ventricular ejection fraction, by estimation, is 30 to 35%. The left ventricle has moderately decreased function. The left ventricle demonstrates global hypokinesis. The left ventricular internal cavity size was moderately dilated. Left ventricular diastolic function could not be evaluated.  2. Right ventricular systolic function is mildly reduced. The right ventricular size is moderately enlarged. There is moderately elevated pulmonary artery systolic pressure. The estimated right ventricular systolic pressure is 97.5 mmHg.  3. Left atrial size was severely dilated.  4. Right atrial size was severely dilated.  5. The mitral valve is grossly structurally normal. Severe mitral valve regurgitation with a central jet, consistent with sec.  6. Tricuspid valve regurgitation is mild to moderate.  7. Findings suggest severe low flow, low gradient aortic stenosis with low cardiac output. The aortic valve is functionally bicuspid (fused right and left cusps). There  is moderate calcification of the aortic valve. There is severe thickening of the aortic valve. Aortic valve regurgitation is not visualized.  8. The inferior vena cava is dilated in size with <50% respiratory variability, suggesting right atrial pressure of 15 mmHg. Comparison(s): A prior study was performed on TEE 11/05/2019. Mitral insufficiency is substantially worse on the  current study. FINDINGS  Left Ventricle: There is marked systolic dyssynchrony (apex to base and septum to lateral wall). Left ventricular ejection fraction, by estimation, is 30 to 35%. The left ventricle has moderately decreased function. The left ventricle demonstrates global hypokinesis. The left ventricular internal cavity size was moderately dilated. There is no left ventricular hypertrophy. Abnormal (paradoxical) septal motion, consistent with left bundle branch block and abnormal (paradoxical) septal motion, consistent with RV pacemaker. Left ventricular diastolic function could not be evaluated due to atrial fibrillation. Left ventricular diastolic function could not be evaluated. Right Ventricle: The right ventricular size is moderately enlarged. No increase in right ventricular wall thickness. Right ventricular systolic function is mildly reduced. There is moderately elevated pulmonary artery systolic pressure. The tricuspid regurgitant velocity is 2.87 m/s, and with an assumed right atrial pressure of 15 mmHg, the estimated right ventricular systolic pressure is 96.2 mmHg. Left Atrium: Left atrial size was severely dilated. Right Atrium: Right atrial size was severely dilated. Pericardium: There is no evidence of pericardial effusion. Mitral Valve: There are findings of severe functional MR due to chamber dilation and leaflet malcoaptation. Quantitation challenging due to atrial fibrillation (regurgitant volume 50 ml, regurgitant fraction 53%, EROA 0.32 cm), but there is systolic flow reversal in the pulmonary veins. The mitral  valve is grossly normal. Severe mitral valve regurgitation, with centrally-directed jet. Pulmonary venous flow shows systolic flow reversal. Tricuspid Valve: The tricuspid valve is normal in structure. Tricuspid valve regurgitation is mild to moderate. Aortic Valve: Findings suggest severe low flow, low gradient aortic stenosis with low cardiac output. The aortic valve is bicuspid. There is moderate calcification of the aortic valve. There is severe thickening of the aortic valve. Aortic valve regurgitation is not visualized. Severe aortic stenosis is present. Aortic valve mean gradient measures 21.0 mmHg. Aortic valve peak gradient measures 39.2 mmHg. Aortic valve area, by VTI measures 0.87 cm. Pulmonic Valve: The pulmonic valve was normal in structure. Pulmonic valve regurgitation is not visualized. Aorta: The aortic root and ascending aorta are structurally normal, with no evidence of dilitation. Venous: The inferior vena cava is dilated in size with less than 50% respiratory variability, suggesting right atrial pressure of 15 mmHg. IAS/Shunts: No atrial level shunt detected by color flow Doppler. Additional Comments: A pacer wire is visualized.  LEFT VENTRICLE PLAX 2D LVIDd:         6.40 cm      Diastology LVIDs:         4.90 cm      LV e' medial:    3.98 cm/s LV PW:         1.00 cm      LV E/e' medial:  27.6 LV IVS:        0.80 cm      LV e' lateral:   9.30 cm/s LVOT diam:     2.30 cm      LV E/e' lateral: 11.8 LV SV:         52 LV SV Index:   23 LVOT Area:     4.15 cm  LV Volumes (MOD) LV vol d, MOD A2C: 155.0 ml LV vol d, MOD A4C: 154.0 ml LV vol s, MOD A2C: 93.9 ml LV vol s, MOD A4C: 95.0 ml LV SV MOD A2C:     61.1 ml LV SV MOD A4C:     154.0 ml LV SV MOD BP:      61.1 ml RIGHT VENTRICLE RV S prime:     6.38  cm/s TAPSE (M-mode): 1.6 cm LEFT ATRIUM              Index       RIGHT ATRIUM           Index LA diam:        5.60 cm  2.45 cm/m  RA Area:     21.80 cm LA Vol (A2C):   103.0 ml 45.12 ml/m RA Volume:    59.10 ml  25.89 ml/m LA Vol (A4C):   64.5 ml  28.26 ml/m LA Biplane Vol: 89.1 ml  39.03 ml/m  AORTIC VALVE AV Area (Vmax):    0.79 cm AV Area (Vmean):   0.79 cm AV Area (VTI):     0.87 cm AV Vmax:           313.00 cm/s AV Vmean:          208.000 cm/s AV VTI:            0.600 m AV Peak Grad:      39.2 mmHg AV Mean Grad:      21.0 mmHg LVOT Vmax:         59.20 cm/s LVOT Vmean:        39.600 cm/s LVOT VTI:          0.125 m LVOT/AV VTI ratio: 0.21  AORTA Ao Root diam: 2.70 cm Ao Asc diam:  3.30 cm MITRAL VALVE                 TRICUSPID VALVE MV Area (PHT): 4.21 cm      TR Peak grad:   32.9 mmHg MV Decel Time: 180 msec      TR Vmax:        287.00 cm/s MR Peak grad:    93.3 mmHg MR Mean grad:    63.0 mmHg   SHUNTS MR Vmax:         483.00 cm/s Systemic VTI:  0.12 m MR Vmean:        375.0 cm/s  Systemic Diam: 2.30 cm MR PISA:         4.02 cm MR PISA Eff ROA: 32 mm MR PISA Radius:  0.80 cm MV E velocity: 110.00 cm/s Dani Gobble Croitoru MD Electronically signed by Sanda Klein MD Signature Date/Time: 04/26/2020/1:56:20 PM    Final    [place] EKG SITE RITE  Result Date: 04/25/2020 If Site Rite image not attached, placement could not be confirmed due to current cardiac rhythm.   Cardiac Studies     ECHOCARDIOGRAM REPORT       Patient Name:  CEBASTIAN NEIS Date of Exam: 10/14/2019  Medical Rec #: [id number]    Height:    72.0 in  Accession #:  [id number]    Weight:    242.2 lb  Date of Birth: Jul 27, 1945    BSA:     2.311 m  Patient Age:  59 years     BP:      120/80 mmHg  Patient Gender: M        HR:      88 bpm.  Exam Location: [facility]   Procedure: 2D Echo, Cardiac Doppler and Color Doppler   Indications:   I35.0 Aortic valve stenosis    History:     Patient has prior history of Echocardiogram examinations,  most          recent 08/20/2018. CHF, Pacemaker, CKD, Aortic Valve  Disease,          Arrythmias:Atrial Flutter,  Atrial Fibrillation and  Complete          heart block; Risk Factors:Hypertension, Diabetes,  Dyslipidemia          and Former Smoker.    Sonographer:   Jeneen Montgomery RDMS, RVT, RDCS  Referring Phys: [id number] Alphonse Guild BRANCH  Diagnosing Phys: Rozann Lesches MD   IMPRESSIONS    1. Left ventricular ejection fraction, by estimation, is 30 to 35%. The  left ventricle has moderately decreased function. The left ventricle  demonstrates global hypokinesis with abnormal septal motion possibly  consistent with pacing. The left  ventricular internal cavity size was mildly dilated. Left ventricular  diastolic parameters are indeterminate.  2. Right ventricular systolic function is mildly reduced. The right  ventricular size is normal. There is mildly elevated pulmonary artery  systolic pressure. The estimated right ventricular systolic pressure is  17.0 mmHg.  3. Left atrial size was moderately dilated.  4. Right atrial size was moderately dilated.  5. The mitral valve is abnormal, mildly thickened and calcified. Moderate  to severe mitral valve regurgitation, eccentric and posteriorly directed.  Not clear that PISA calculations best define degree of regurgitation.  6. The aortic valve has an indeterminant number of cusps and is severely  calcified. Aortic valve mean gradient measures 15.5 mmHg. Cannot exclude  severe low gradient aortic stenosis with dimentionless index of 0.23.  7. The inferior vena cava is dilated in size with <50% respiratory  variability, suggesting right atrial pressure of 15 mmHg.   FINDINGS  Left Ventricle: Left ventricular ejection fraction, by estimation, is 30  to 35%. The left ventricle has moderately decreased function. The left  ventricle demonstrates global hypokinesis. The left ventricular internal  cavity size was mildly dilated.  There is no left ventricular hypertrophy. Abnormal (paradoxical) septal  motion, consistent  with RV pacemaker. Left ventricular diastolic  parameters are indeterminate.   Right Ventricle: The right ventricular size is normal. No increase in  right ventricular wall thickness. Right ventricular systolic function is  mildly reduced. There is mildly elevated pulmonary artery systolic  pressure. The tricuspid regurgitant velocity  is 2.56 m/s, and with an assumed right atrial pressure of 15 mmHg, the  estimated right ventricular systolic pressure is 01.7 mmHg.   Left Atrium: Left atrial size was moderately dilated.   Right Atrium: Right atrial size was moderately dilated.   Pericardium: There is no evidence of pericardial effusion.   Mitral Valve: The mitral valve is abnormal. There is mild thickening of  the mitral valve leaflet(s). There is mild calcification of the mitral  valve leaflet(s). Moderate to severe mitral valve regurgitation, with  eccentric posteriorly directed jet.   Tricuspid Valve: The tricuspid valve is grossly normal. Tricuspid valve  regurgitation is mild.   Aortic Valve: The aortic valve has an indeterminant number of cusps.  Aortic valve regurgitation is not visualized. There is severe calcifcation  of the aortic valve. Aortic valve mean gradient measures 15.5 mmHg. Aortic  valve peak gradient measures 26.0  mmHg. Aortic valve area, by VTI measures 0.86 cm.   Pulmonic Valve: The pulmonic valve was grossly normal. Pulmonic valve  regurgitation is trivial.   Aorta: The aortic root is normal in size and structure.   Venous: The inferior vena cava is dilated in size with less than 50%  respiratory variability, suggesting right atrial pressure of 15 mmHg.   IAS/Shunts: No atrial level shunt detected by color flow Doppler.   Additional Comments: A pacer wire is visualized.  LEFT VENTRICLE  PLAX 2D  LVIDd:     6.02 cm   Diastology  LVIDs:     4.08 cm   LV e' lateral:  12.50 cm/s  LV PW:     0.90 cm   LV E/e' lateral: 10.4   LV IVS:    0.65 cm   LV e' medial:  6.00 cm/s  LVOT diam:   2.20 cm   LV E/e' medial: 21.7  LV SV:     51  LV SV Index:  22  LVOT Area:   3.80 cm    LV Volumes (MOD)  LV vol d, MOD A2C: 168.5 ml  LV vol d, MOD A4C: 149.7 ml  LV vol s, MOD A2C: 89.2 ml  LV vol s, MOD A4C: 82.0 ml  LV SV MOD A2C:   79.3 ml  LV SV MOD A4C:   149.7 ml  LV SV MOD BP:   73.9 ml   RIGHT VENTRICLE  RV S prime:   10.30 cm/s  TAPSE (M-mode): 1.8 cm   LEFT ATRIUM      Index  LA diam:   3.30 cm 1.43 cm/m  LA Vol (A2C): 100.0 ml 43.27 ml/m  LA Vol (A4C): 94.0 ml 40.67 ml/m  AORTIC VALVE  AV Area (Vmax):  1.02 cm  AV Area (Vmean):  0.94 cm  AV Area (VTI):   0.86 cm  AV Vmax:      255.00 cm/s  AV Vmean:     188.500 cm/s  AV VTI:      0.597 m  AV Peak Grad:   26.0 mmHg  AV Mean Grad:   15.5 mmHg  LVOT Vmax:     68.50 cm/s  LVOT Vmean:    46.600 cm/s  LVOT VTI:     0.135 m  LVOT/AV VTI ratio: 0.23    AORTA  Ao Root diam: 3.00 cm   MITRAL VALVE         TRICUSPID VALVE  MV Area (PHT):        TR Peak grad:  26.2 mmHg  MV Decel Time: 211 msec   TR Vmax:    256.00 cm/s  MR Peak grad:  81.5 mmHg  MR Mean grad:  45.0 mmHg  SHUNTS  MR Vmax:     451.25 cm/s Systemic VTI: 0.14 m  MR Vmean:    308.0 cm/s Systemic Diam: 2.20 cm  MR PISA Eff ROA: 5 mm  MV E velocity: 130.00 cm/s  MV A velocity: 18.40 cm/s  MV E/A ratio: 7.07   Rozann Lesches MD  Electronically signed by Rozann Lesches MD  Signature Date/Time: 10/14/2019/5:21:18 PM      TRANSESOPHOGEAL ECHO REPORT       Patient Name:  [name] Date of Exam: 11/05/2019  Medical Rec #: [id number]    Height:    72.0 in  Accession #:  [id number]    Weight:    224.0 lb  Date of Birth: 07-Sep-1945    BSA:     2.236 m  Patient Age:  75 years     BP:      112/93 mmHg  Patient  Gender: M        HR:      65 bpm.  Exam Location: [facility]   Procedure: Transesophageal Echo   Indications:  Congestive Heart Failure 428.0 / I50.9    History:    Patient has prior history of Echocardiogram examinations,  most  recent 10/14/2019. CHF, Pacemaker, Arrythmias:Atrial  Fibrillation         and Atrial Flutter; Risk Factors:Former Smoker,  Dyslipidemia and         Diabetes. CHB, Elevated troponin, NSVT.    Sonographer:  [name] RDCS (AE)  Referring Phys: [place].   PROCEDURE: After discussion of the risks and benefits of a TEE, an  informed consent was obtained from the patient. The transesophogeal probe  was passed without difficulty through the esophogus of the patient. Local  oropharyngeal anesthetic was provided  with viscous lidocaine. Sedation performed by performing physician. The  patient developed no complications during the procedure.   IMPRESSIONS    1. Left ventricular ejection fraction, by estimation, is 35 to 40%. The  left ventricle has moderately decreased function. The left ventricle  demonstrates global hypokinesis.  2. Right ventricular systolic function is mildly reduced. The right  ventricular size is mildly enlarged.  3. Left atrial size was moderately dilated. No left atrial/left atrial  appendage thrombus was detected. The LAA emptying velocity was 30 cm/s.  4. Right atrial size was moderately dilated.  5. The mitral valve is abnormal. Moderate mitral valve regurgitation. No  evidence of mitral stenosis.  6. Tricuspid valve regurgitation is moderate.  7. The aortic valve is abnormal. Aortic valve regurgitation is not  visualized.   FINDINGS  Left Ventricle: Left ventricular ejection fraction, by estimation, is 35  to 40%. The left ventricle has moderately decreased function. The left  ventricle demonstrates global hypokinesis. The left ventricular  internal  cavity size was normal in size.  There is no left ventricular hypertrophy.   Right Ventricle: The right ventricular size is mildly enlarged. Right  vetricular wall thickness was not assessed. Right ventricular systolic  function is mildly reduced.   Left Atrium: Left atrial size was moderately dilated. No left atrial/left  atrial appendage thrombus was detected. The LAA emptying velocity was 30  cm/s.   Right Atrium: Right atrial size was moderately dilated.   Pericardium: There is no evidence of pericardial effusion.   Mitral Valve: The MV vena contracta is 0.4 cm. The mitral valve is  abnormal. Moderate mitral valve regurgitation. No evidence of mitral valve  stenosis.   Tricuspid Valve: The tricuspid valve is normal in structure. Tricuspid  valve regurgitation is moderate . No evidence of tricuspid stenosis.   Aortic Valve: The noncoronary and left coronary cusp are calcified and  fused. The aortic valve is abnormal. . There is severe thickening and  severe calcifcation of the aortic valve. Aortic valve regurgitation is not  visualized. Moderate to severe aortic  valve annular calcification. There is severe thickening of the aortic  valve. There is severe calcifcation of the aortic valve.   Pulmonic Valve: The pulmonic valve was not well visualized. Pulmonic valve  regurgitation is not visualized. No evidence of pulmonic stenosis.   Aorta: Normal visualized portions of the descending thoracic aorta. The  aortic root is normal in size and structure.   IAS/Shunts: No atrial level shunt detected by color flow Doppler.     MR Peak grad:  71.6 mmHg  MR Mean grad:  42.0 mmHg  MR Vmax:     423.00 cm/s  MR Vmean:    296.0 cm/s  MR PISA:     1.01 cm  MR PISA Eff ROA: 6 mm  MR PISA Radius: 0.40 cm   [name] MD  Electronically signed by [name] MD  Signature Date/Time:  11/06/2019/3:59:20 PM      RIGHT/LEFT HEART CATH AND  CORONARY ANGIOGRAPHY    Conclusion  Conclusions: 1. No angiographically significant coronary artery disease. 2. Mildly to moderately elevated left heart filling pressures (PCWP 22 mmHg, LVEDP 25-30 mmHg). Prominent V waves noted on PCWP tracing consistent with significant mitral regurgitation. 3. Severely elevated right heart filling pressures (mean RA and RVEDP 16 mmHg). 4. Moderately reduced Fick cardiac output/index. 5. Moderate aortic stenosis (mean gradient 21 mmHg, AVA 1.3 cm^2).  Recommendations: 1. Continue optimization of evidence-based heart failure as well as further diuresis. 2. Ongoing management of non-ischemic cardiomyopathy and valvular heart disease per Dr. Harl Bowie. 3. Primary prevention of coronary artery disease.  Nelva Bush, MD [facility]   Recommendations  Antiplatelet/Anticoag Recommend to resume Apixaban, at currently prescribed dose and frequency on 12/22/2019. Concurrent antiplatelet therapy not recommended.  Discharge Date In the absence of any other complications or medical issues, we expect the patient to be ready for discharge on 12/21/2019.   Surgeon Notes    12/21/2019 8:46 AM Brief Op Note signed by Nelva Bush, MD    Indications  Chronic HFrEF (heart failure with reduced ejection fraction) (HCC) [I50.22 (ICD-10-CM)]  Aortic valve stenosis, etiology of cardiac valve disease unspecified [I35.0 (ICD-10-CM)]   Procedural Details  Technical Details Indication: 75 y.o. year-old man with chronic HFrEF atrial flutter, PAF, CHB s/p PPM, HTN, aortic stenosis, abnormal liver test, presenting for evaluation of shortness of breath in the setting of reduced LVEF and moderate to severe aortic stenosis by echo.  GFR: 41 ml/min  Procedure: The risks, benefits, complications, treatment options, and expected outcomes were discussed with the patient. The patient and/or family concurred with the proposed plan, giving informed consent.  The patient was sedated with IV midazolam and fentanyl. The right wrist was assessed with a modified Allens test which was normal. The right wrist was prepped and draped in a sterile fashion. 1% lidocaine was used for local anesthesia. A previously placed antecubital vein IV was exchanged for a 38F slender Glidesheath using modified Seldinger technique. Right heart catheterization was performed by advancing a 38F balloon-tipped catheter through the right heart chambers into the pulmonary capillary wedge position. Pressure measurements and oxygen saturations were obtained.  Using the modified Seldinger access technique, a 47F slender Glidesheath was placed in the right radial artery. 3 mg Verapamil was given through the sheath. Heparin 5,000 units were administered. Tortuosity of the right subclavian/brachiocephalic artery limited catheter manipulation. Therefore, the indwelling sheath was exchanged for a 75 cm 47F Destination Glidesheath over the J-wire. Selective coronary angiography was performed using a 38F JL3.5 catheter to engage the left coronary artery and a 38F JR4 catheter to engage the right coronary artery. Left heart catheterization was performed using a 38F JR4 catheter. The JR4 catheter was exchanged for a 58F pigtail catheter and simultaneous aortic and left ventricular pressures were recorded using the sheath and catheter. Left ventriculogram was not performed.  At the end of the procedure, the radial artery sheath was removed and a TR band applied to achieve patent hemostasis. The right antecubital vein sheath was removed and hemostasis achieved with manual compression. There were no immediate complications. The patient was taken to the recovery area in stable condition.  Estimated blood loss <50 mL.   During this procedure medications were administered to achieve and maintain moderate conscious sedation while the patient's heart rate, blood pressure, and oxygen saturation were continuously  monitored and I was present face-to-face 100% of this time.  Medications (Filter: Administrations occurring from 0725 to 0839 on 12/21/19) (important) Continuous medications are totaled by the amount administered until 12/21/19 0839.    Heparin (Porcine) in NaCl 1000-0.9 UT/500ML-% SOLN (mL) Total volume:  1,000 mL  Date/Time Rate/Dose/Volume Action   12/21/19 0734 500 mL Given   0735 500 mL Given    fentaNYL (SUBLIMAZE) injection (mcg) Total dose:  25 mcg  Date/Time Rate/Dose/Volume Action   12/21/19 0744 25 mcg Given    midazolam (VERSED) injection (mg) Total dose:  1 mg  Date/Time Rate/Dose/Volume Action   12/21/19 0745 1 mg Given    lidocaine (PF) (XYLOCAINE) 1 % injection (mL) Total volume:  4 mL  Date/Time Rate/Dose/Volume Action   12/21/19 0745 2 mL Given   0755 2 mL Given    Radial Cocktail/Verapamil only (mL) Total volume:  10 mL  Date/Time Rate/Dose/Volume Action   12/21/19 0756 10 mL Given    heparin sodium (porcine) injection (Units) Total dose:  5,000 Units  Date/Time Rate/Dose/Volume Action   12/21/19 0758 5,000 Units Given    iohexol (OMNIPAQUE) 350 MG/ML injection (mL) Total volume:  25 mL  Date/Time Rate/Dose/Volume Action   12/21/19 0835 25 mL Given    Sedation Time  Sedation Time Physician-1: 45 minutes 26 seconds   Contrast  Medication Name Total Dose  iohexol (OMNIPAQUE) 350 MG/ML injection 25 mL    Radiation/Fluoro  Fluoro time: 10.7 (min) DAP: 27425 (mGycm2) Cumulative Air Kerma: 983 (mGy)   Complications   Complications documented before study signed (12/21/2019 38:25 PM)    No complications were associated with this study.  Documented by Nelva Bush, MD - 12/21/2019 9:20 AM     Coronary Findings   Diagnostic Dominance: Right  Left Main  Vessel is large. Vessel is angiographically normal.  Left Anterior Descending  Vessel is large. Vessel is  angiographically normal.  First Diagonal Branch  Vessel is large in size.  Second Diagonal Branch  Vessel is small in size.  Third Diagonal Branch  Vessel is small in size.  Ramus Intermedius  Vessel is moderate in size. Vessel is angiographically normal.  Left Circumflex  Vessel is moderate in size. Vessel is angiographically normal.  First Obtuse Marginal Branch  Vessel is moderate in size.  Right Coronary Artery  Vessel is moderate in size. Vessel is angiographically normal.  Right Posterior Descending Artery  Vessel is small in size.  Right Posterior Atrioventricular Artery  Vessel is small in size.   Intervention   No interventions have been documented.  Right Heart  Right Heart Pressures RA (mean): 16 mmHg RV (S/EDP): 60/16 mmHg PA (S/D, mean): 52/20 (31) mmHg PCWP (mean): 22 mmHg with prominent V waves  Ao sat: 98% PA sat: 60%  Fick CO: 4.0 L/min Fick CI: 1.8 L/min/m^2   Left Heart  Left Ventricle LV end diastolic pressure is moderately elevated. LVEDP 25-30 mmHg.  Aortic Valve There is moderate aortic valve stenosis. Mean gradient 21: mmHg. Valve area: 1.3 cm^2.   Coronary Diagrams   Diagnostic Dominance: Right    Intervention    Implants    No implant documentation for this case.    Syngo Images  Show images for CARDIAC CATHETERIZATION  Images on Long Term Storage  Show images for Evrett, Hakim to Procedure Log  Procedure Log     Hemo Data  Flowsheet Row Most Recent Value  Fick Cardiac Output 4 L/min  Fick Cardiac Output Index 1.8 (L/min)/BSA  Aortic Mean Gradient 20.9 mmHg  Aortic Peak Gradient 9 mmHg  Aortic Valve Area 1.31  Aortic Value Area Index 0.59 cm2/BSA  RA A Wave 18 mmHg  RA V Wave 16 mmHg  RA Mean 14 mmHg  RV Systolic Pressure 49 mmHg  RV Diastolic Pressure 4 mmHg  RV EDP 17 mmHg  PA Systolic Pressure 47 mmHg  PA Diastolic Pressure 17 mmHg  PA Mean 30 mmHg  PW A Wave  21 mmHg  PW V Wave 34 mmHg  PW Mean 21 mmHg  AO Systolic Pressure 945 mmHg  AO Diastolic Pressure 64 mmHg  AO Mean 81 mmHg  LV Systolic Pressure 038 mmHg  LV Diastolic Pressure 9 mmHg  LV EDP 24 mmHg  AOp Systolic Pressure 98 mmHg  AOp Diastolic Pressure 57 mmHg  AOp Mean Pressure 74 mmHg  LVp Systolic Pressure 882 mmHg  LVp Diastolic Pressure 9 mmHg  LVp EDP Pressure 23 mmHg  QP/QS 1  TPVR Index 16.1 HRUI    EKG:   Sinus rhythm w/ complete heart block and AV paced rhythm   ULTRASOUND ABDOMEN LIMITED RIGHT UPPER QUADRANT  COMPARISON: 11/24/2019  FINDINGS: Gallbladder:  Sludge and stones within the gallbladder. Slight increased gallbladder wall thickness of 4.3 mm. Positive sonographic Murphy.  Common bile duct:  Diameter: 3.7 mm  Liver:  Coarse liver with suspected contour nodularity/cirrhosis. No focal hepatic abnormality. Portal vein is patent on color Doppler imaging with normal direction of blood flow towards the liver.  Other: None.  IMPRESSION: 1. Sludge and stones in the gallbladder with slight wall thickening and positive sonographic Murphy, concerning for acute cholecystitis. Negative for biliary dilatation 2. Suspected liver cirrhosis.   Electronically Signed By: Donavan Foil M.D. On: 03/09/2020 17:56    Cardiac TAVR CT  TECHNIQUE: The patient was scanned on a Siemens Force 800 slice scanner. A 120 kV retrospective scan was triggered in the descending thoracic aorta at 111 HU's. Gantry rotation speed was 270 msecs and collimation was .9 mm. No beta blockade or nitro were given. The 3D data set was reconstructed in 5% intervals of the R-R cycle. Systolic and diastolic phases were analyzed on a dedicated work station using MPR, MIP and VRT modes. The patient received 80 cc of contrast.  FINDINGS: Aortic Valve: Tri leaflet calcified with restricted leaflet motion Calcium score for valve 1819  Aorta: No aneurysm  mild calcific atherosclerosis  Sinotubular Junction: 29 mm  Ascending Thoracic Aorta: 33 mm  Aortic Arch: 30 mm  Descending Thoracic Aorta: 25 mm  Sinus of Valsalva Measurements:  Non-coronary: 34 mm  Right - coronary: 27.7 mm  Left - coronary: 32.6 mm  Coronary Artery Height above Annulus:  Left Main: 13.6 mm above annulus  Right Coronary: 15.5 mm above annulus  Virtual Basal Annulus Measurements:  Maximum/Minimum Diameter: 31.6 mm x 21.5 mm Elliptical  Perimeter: 90.6 mm  Area: 589.5 mm2  Coronary Arteries: Sufficient height above annulus for deployment  Pacing wires in RA/RV  Optimum Fluoroscopic Angle for Delivery: LAO 15 Caudal 14 degrees  IMPRESSION: 1. Tri-leaflet AV with calcium score 1819 and annular area of 589 mm2 suitable for a 29 mm Sapien 3 valve  2. Coronary arteries sufficient height above annulus for deployment  3. Optimum angiographic angle for deployment LAO 15 Caudal 14 degrees  4. Normal aortic root 3.3 cm  Jenkins Rouge   Electronically Signed By: Jenkins Rouge M.D. On: 04/20/2020 13:16    Patient Profile     KIMBERLY COYE is a 75 y.o. male with  a history of symptomatic bradycardia s/p PPM with plans for BiV upgrade, HTN, HLD, DMT2, atrial flutter s/p ablation, persistent atrial fibrillation on Eliquis, CKD stage III (creat 1.5-2), NASH cirrhosis, thrombocytopenia, moderate MR/TR, severe LFLG AS and combined S/D CHF 2/2 NICM and RV dysfunction with recent admission for acute CHF and symptomatic cholelithiasis who was readmitted 04/25/20 for acute on chronic combined S/D CHF failing outpatient therapy. Structural heart team consulted for timing of TAVR.   Assessment & Plan    Severe LFLG AS: repeat echo this admission with EF 30-35%, mild RV dysfunction, severe MR, severe AS with mean grad 21.0 mmHg, peak grad 39.2 mmHg, AVA 0.79 cm2, DVI .21, SVI 23. Cardiac gated CTA of the heart revealed anatomical  characteristics consistent with aortic stenosis suitable for treatment by transcatheter aortic valve replacement without any significant complicating features and CTA of the aorta and iliac vessels demonstrate moderate disease at the aortic bifurcation with possible TF access. Industry reported bulky disease at the aorto-iliac bifurcation and suggested alternative approach either left subclavian or left carotid with a 96F sheath. We think we will able to use TF access and will tentatively schedule him for TAVR next Tuesday with Dr. Buena Irish and Dr. Cyndia Bent.   Severe MR: worse on most recent study compared to TEE in 10/2019, possibly related to worsening volume status   Acute on chronic diastolic CHF/NICM: based on timing of drop in EF and 42% pacing percentage, concern for RV paced CM. Had recent [facility] 9/21 that showed no coronary disease.Plans for CRT upgrade. He presented with NYHA class IV symptoms. Being diuresed under the care of the advanced CHF team with a marked clinical improvement.    Symptomatic bradycardia s/p PPM: followed by Dr. Lovena Le. Discussing upgrade to BiV ICD.  Chronic afib: on Eliquis. Rate well controlled.   NASH cirrhosis: followed by Dr. Jenetta Downer, NAFLD socre 2.90 consistent with advanced fibrosis. Elastography ordered but not yet complete. No history of bleeding or known varices  CKD stage IIIB: creat stable with diuresis ~ 1.8  Signed, Angelena Form, PA-C  04/27/2020, 5:37 PM  Pager [phone]  I have personally seen and examined this patient. I agree with the assessment and plan as outlined above.  He has severe low flow/low gradient AS. Now admitted with worsened heart failure. He is responding well to diuresis by the Advanced Heart Failure team. His TAVR workup is complete. He has bulky calcification at the aortic bifurcation in both common iliac arteries. Since a 32 [place] will be required for access, we may need to consider alternative access for TAVR. Will review  with our CT surgery team.  Continue diuresis. RHC this week.  Further planning for TAVR next Tuesday. Pacer upgrade post TAVR  Lauree Chandler 04/28/2020 9:06 AM

## 2020-04-27 NOTE — Progress Notes (Signed)
Retaken @1051 

## 2020-04-27 NOTE — Progress Notes (Signed)
CRITICAL VALUE ALERT  Critical Value:  Glucose was 67  Date & Time Notied:  At [phone]   Actions taken: One milk with three packs of sugar given. Glucose is 82 at 0651. No symptoms of hypoglycemia. Will continue to assess.

## 2020-04-27 NOTE — Progress Notes (Signed)
Remote pacemaker transmission.   

## 2020-04-27 NOTE — Progress Notes (Addendum)
Advanced Heart Failure Rounding Note   Subjective:    CVP down to 17, weight down another 2lbs.  No longer having orthopnea.  Ambulating without dyspnea but has only walked to the bathroom so far.  Complaining of lower back pain but denies chest pain.     Objective:   Weight Range:  Vital Signs:   Temp:  [97.6 F (36.4 C)-98.4 F (36.9 C)] 98.4 F (36.9 C) (02/02 0509) Pulse Rate:  [6-68] 60 (02/02 0509) Resp:  [15-22] 22 (02/02 0509) BP: (97-117)/(60-91) 113/67 (02/02 0509) SpO2:  [97 %-100 %] 98 % (02/02 0509) Weight:  [105.8 kg] 105.8 kg (02/02 0507)    Weight change: Filed Weights   04/26/20 0301 04/27/20 0507  Weight: 106.7 kg 105.8 kg    Intake/Output:   Intake/Output Summary (Last 24 hours) at 04/27/2020 0747 Last data filed at 04/27/2020 0500 Gross per 24 hour  Intake 360 ml  Output 2275 ml  Net -1915 ml     Physical Exam: Cardiac: neck veins distended, normal rate and rhythm, diminished S2, 3/6 AS murmur, LE edema with legs in unna boots Pulmonary: CTAB, not in distress Abdominal: non distended abdomen, soft and nontender Psych: Alert, conversant, in good spirits   Telemetry: AF v paced Personally reviewed   Labs: Basic Metabolic Panel: Recent Labs  Lab 04/20/20 0904 04/25/20 1157 04/26/20 0308 04/27/20 0500  NA 135 138 136 137  K 4.8 4.5 3.9 4.1  CL 95* 101 100 102  CO2 29 26 26 25   GLUCOSE 127* 179* 77 70  BUN 23 16 15 14   CREATININE 1.67* 1.66* 1.44* 1.56*  CALCIUM 8.9 8.7* 8.7* 7.8*    Liver Function Tests: No results for input(s): AST, ALT, ALKPHOS, BILITOT, PROT, ALBUMIN in the last 168 hours. No results for input(s): LIPASE, AMYLASE in the last 168 hours. No results for input(s): AMMONIA in the last 168 hours.  CBC: Recent Labs  Lab 04/25/20 1157  WBC 6.8  HGB 14.2  HCT 42.9  MCV 94.5  PLT 145*    Cardiac Enzymes: No results for input(s): CKTOTAL, CKMB, CKMBINDEX, TROPONINI in the last 168 hours.  BNP: BNP (last  3 results) Recent Labs    03/09/20 1544 03/20/20 1253 04/25/20 1157  BNP 1,540.0* 2,072.0* 2,410.2*    ProBNP (last 3 results) No results for input(s): PROBNP in the last 8760 hours.    Other results:  Imaging: DG Chest 2 View  Result Date: 04/25/2020 CLINICAL DATA:  Shortness of breath for 6 months EXAM: CHEST - 2 VIEW COMPARISON:  03/20/2020 FINDINGS: Left-sided implanted cardiac device. Stable cardiomegaly. Atherosclerotic calcification of the aortic knob. Mild pulmonary vascular congestion with mild diffuse interstitial prominence. No pleural effusion or pneumothorax. IMPRESSION: Cardiomegaly with mild pulmonary vascular congestion and diffuse interstitial prominence, suggesting CHF with mild edema. Electronically Signed   By: Davina Poke D.O.   On: 04/25/2020 12:24   DG CHEST PORT 1 VIEW  Result Date: 04/25/2020 CLINICAL DATA:  PICC placement. EXAM: PORTABLE CHEST 1 VIEW COMPARISON:  Radiograph earlier today.  CT 04/20/2020 FINDINGS: Right upper extremity PICC tip in the mid SVC. No pneumothorax. Left-sided pacemaker in place. Stable cardiomegaly. Right pleural effusion appears similar to prior. Underlying interstitial opacities suspicious for pulmonary edema, also unchanged. Stable osseous structures. IMPRESSION: 1. Right upper extremity PICC tip in the mid SVC. No pneumothorax. 2. Stable cardiomegaly, pulmonary edema and right pleural effusion. Electronically Signed   By: Keith Rake M.D.   On: 04/25/2020 21:43  ECHOCARDIOGRAM COMPLETE  Result Date: 04/26/2020    ECHOCARDIOGRAM REPORT   Patient Name:   MONNIE ANSPACH Date of Exam: 04/26/2020 Medical Rec #:  [id number]        Height:       72.0 in Accession #:    [id number]       Weight:       235.2 lb Date of Birth:  [birth year]        BSA:          2.283 m Patient Age:    75 years         BP:           122/76 mmHg Patient Gender: M                HR:           67 bpm. Exam Location:  Inpatient Procedure: 2D Echo, Color  Doppler and Cardiac Doppler Indications:    Aortic Stenosis i35.0  History:        Patient has prior history of Echocardiogram examinations, most                 recent 11/06/2019. CHF, Pacemaker, Arrythmias:Atrial                 Fibrillation; Risk Factors:Hypertension, Diabetes and                 Dyslipidemia.  Sonographer:    Raquel Sarna Senior RDCS Referring Phys: [id number] Merle Cirelli R Moiz Ryant IMPRESSIONS  1. Left ventricular ejection fraction, by estimation, is 30 to 35%. The left ventricle has moderately decreased function. The left ventricle demonstrates global hypokinesis. The left ventricular internal cavity size was moderately dilated. Left ventricular diastolic function could not be evaluated.  2. Right ventricular systolic function is mildly reduced. The right ventricular size is moderately enlarged. There is moderately elevated pulmonary artery systolic pressure. The estimated right ventricular systolic pressure is 41.3 mmHg.  3. Left atrial size was severely dilated.  4. Right atrial size was severely dilated.  5. The mitral valve is grossly structurally normal. Severe mitral valve regurgitation with a central jet, consistent with sec.  6. Tricuspid valve regurgitation is mild to moderate.  7. Findings suggest severe low flow, low gradient aortic stenosis with low cardiac output. The aortic valve is functionally bicuspid (fused right and left cusps). There is moderate calcification of the aortic valve. There is severe thickening of the aortic valve. Aortic valve regurgitation is not visualized.  8. The inferior vena cava is dilated in size with <50% respiratory variability, suggesting right atrial pressure of 15 mmHg. Comparison(s): A prior study was performed on TEE 11/05/2019. Mitral insufficiency is substantially worse on the current study. FINDINGS  Left Ventricle: There is marked systolic dyssynchrony (apex to base and septum to lateral wall). Left ventricular ejection fraction, by estimation, is 30 to 35%.  The left ventricle has moderately decreased function. The left ventricle demonstrates global hypokinesis. The left ventricular internal cavity size was moderately dilated. There is no left ventricular hypertrophy. Abnormal (paradoxical) septal motion, consistent with left bundle branch block and abnormal (paradoxical) septal motion, consistent with RV pacemaker. Left ventricular diastolic function could not be evaluated due to atrial fibrillation. Left ventricular diastolic function could not be evaluated. Right Ventricle: The right ventricular size is moderately enlarged. No increase in right ventricular wall thickness. Right ventricular systolic function is mildly reduced. There is moderately elevated pulmonary artery systolic pressure. The tricuspid regurgitant velocity is 2.87 m/s,  and with an assumed right atrial pressure of 15 mmHg, the estimated right ventricular systolic pressure is 78.2 mmHg. Left Atrium: Left atrial size was severely dilated. Right Atrium: Right atrial size was severely dilated. Pericardium: There is no evidence of pericardial effusion. Mitral Valve: There are findings of severe functional MR due to chamber dilation and leaflet malcoaptation. Quantitation challenging due to atrial fibrillation (regurgitant volume 50 ml, regurgitant fraction 53%, EROA 0.32 cm), but there is systolic flow reversal in the pulmonary veins. The mitral valve is grossly normal. Severe mitral valve regurgitation, with centrally-directed jet. Pulmonary venous flow shows systolic flow reversal. Tricuspid Valve: The tricuspid valve is normal in structure. Tricuspid valve regurgitation is mild to moderate. Aortic Valve: Findings suggest severe low flow, low gradient aortic stenosis with low cardiac output. The aortic valve is bicuspid. There is moderate calcification of the aortic valve. There is severe thickening of the aortic valve. Aortic valve regurgitation is not visualized. Severe aortic stenosis is present.  Aortic valve mean gradient measures 21.0 mmHg. Aortic valve peak gradient measures 39.2 mmHg. Aortic valve area, by VTI measures 0.87 cm. Pulmonic Valve: The pulmonic valve was normal in structure. Pulmonic valve regurgitation is not visualized. Aorta: The aortic root and ascending aorta are structurally normal, with no evidence of dilitation. Venous: The inferior vena cava is dilated in size with less than 50% respiratory variability, suggesting right atrial pressure of 15 mmHg. IAS/Shunts: No atrial level shunt detected by color flow Doppler. Additional Comments: A pacer wire is visualized.  LEFT VENTRICLE PLAX 2D LVIDd:         6.40 cm      Diastology LVIDs:         4.90 cm      LV e' medial:    3.98 cm/s LV PW:         1.00 cm      LV E/e' medial:  27.6 LV IVS:        0.80 cm      LV e' lateral:   9.30 cm/s LVOT diam:     2.30 cm      LV E/e' lateral: 11.8 LV SV:         52 LV SV Index:   23 LVOT Area:     4.15 cm  LV Volumes (MOD) LV vol d, MOD A2C: 155.0 ml LV vol d, MOD A4C: 154.0 ml LV vol s, MOD A2C: 93.9 ml LV vol s, MOD A4C: 95.0 ml LV SV MOD A2C:     61.1 ml LV SV MOD A4C:     154.0 ml LV SV MOD BP:      61.1 ml RIGHT VENTRICLE RV S prime:     6.38 cm/s TAPSE (M-mode): 1.6 cm LEFT ATRIUM              Index       RIGHT ATRIUM           Index LA diam:        5.60 cm  2.45 cm/m  RA Area:     21.80 cm LA Vol (A2C):   103.0 ml 45.12 ml/m RA Volume:   59.10 ml  25.89 ml/m LA Vol (A4C):   64.5 ml  28.26 ml/m LA Biplane Vol: 89.1 ml  39.03 ml/m  AORTIC VALVE AV Area (Vmax):    0.79 cm AV Area (Vmean):   0.79 cm AV Area (VTI):     0.87 cm AV Vmax:  313.00 cm/s AV Vmean:          208.000 cm/s AV VTI:            0.600 m AV Peak Grad:      39.2 mmHg AV Mean Grad:      21.0 mmHg LVOT Vmax:         59.20 cm/s LVOT Vmean:        39.600 cm/s LVOT VTI:          0.125 m LVOT/AV VTI ratio: 0.21  AORTA Ao Root diam: 2.70 cm Ao Asc diam:  3.30 cm MITRAL VALVE                 TRICUSPID VALVE MV Area (PHT):  4.21 cm      TR Peak grad:   32.9 mmHg MV Decel Time: 180 msec      TR Vmax:        287.00 cm/s MR Peak grad:    93.3 mmHg MR Mean grad:    63.0 mmHg   SHUNTS MR Vmax:         483.00 cm/s Systemic VTI:  0.12 m MR Vmean:        375.0 cm/s  Systemic Diam: 2.30 cm MR PISA:         4.02 cm MR PISA Eff ROA: 32 mm MR PISA Radius:  0.80 cm MV E velocity: 110.00 cm/s Dani Gobble Croitoru MD Electronically signed by Sanda Klein MD Signature Date/Time: 04/26/2020/1:56:20 PM    Final    [place] EKG SITE RITE  Result Date: 04/25/2020 If Site Rite image not attached, placement could not be confirmed due to current cardiac rhythm.    Medications:     Scheduled Medications: . apixaban  5 mg Oral BID  . Chlorhexidine Gluconate Cloth  6 each Topical Daily  . dapagliflozin propanediol  10 mg Oral QAC breakfast  . furosemide  80 mg Intravenous BID  . insulin aspart  0-15 Units Subcutaneous TID WC  . insulin glargine  30 Units Subcutaneous QHS  . potassium chloride SA  20 mEq Oral BID  . sodium chloride flush  10-40 mL Intracatheter Q12H  . sodium chloride flush  3 mL Intravenous Q12H    Infusions: . sodium chloride      PRN Medications: sodium chloride, acetaminophen, sodium chloride flush, sodium chloride flush   Assessment/Plan:   1. Acute on Chronic Combined Systolic and Diastolic Heart Failure/NICM: - Echo 08/2015 EF 50-55% (PPM placed in 08/2015)  - Echo 10/2015 EF dropped to 40-45% - Echo 2018 EF 45% - Echo 2021 EF 30-35%, RV mildly reduced  - based on timing of drop in EF and 42% pacing percentage, concern for RV paced CM. Aortic valve also appears severely calcified and moderate-severely stenosed, suspect AS also contributing to CM. Had recent [facility] 9/21 that showed no coronary disease.  - He remains volume overloaded. CVP 17, Co ox inaccurate repeat - Continue IV lasix.  - Once tune up will need TAVR and CRT upgrade (order to be determined).   2. Mitral Regurgitation  - Moderate by TEE  8/21. -Severe by TTE 04/26/20 and consistent with severe functional MR  3. Aortic Stenosis  - severe low flow/low gradient aortic stenosis with mean gradient 15.5 mmHg, dimensionless index 0.23, AVA 0.86 cm2  -ECHO 2/1 with similar findings mean gradient 21, AVA 0.87 cm2 -Timeline for TAVR being discussed.     4. Atrial Flutter/ Chronic Atrial Fibrillation - s/p AFL ablation. -In chronic  Afib, rate controlled. -Continue Eliquis. No bleeding issues. Will need to switch to heparin prior to TAVR - continue holding bb  5. H/o CHB  - s/p PPM followed by Dr. Lovena Le  - ? RV paced CM. He is pacing ~40%. - Will optimize patient's volume and discuss with EP BIV upgrade, likely TAVR first  6. NASH cirrhosis -followed by Dr. Jenetta Downer, NAFLD socre 2.90 consistent with advanced fibrosis -Elastography ordered but not yet complete -no history of bleeding or known varices  7. Stage IIIb CKD -Followed by nephrology. -Baseline SCr ~1.5-1.8 - Creatinine stable today monitor renal fx closely with diuresis  8. T2DM -On insulin.a1C 6.8 (12/21) -On farxiga - decrease lantus and change SSI to sensitive due to hypoglycemia  Length of Stay: 2   Katherine Roan MD 04/27/2020, 7:47 AM  Advanced Heart Failure Team Pager [phone] (M-F; 7a - 4p)  Please contact [facility] for night-coverage after hours (4p -7a ) and weekends on [email or web address]  Patient seen and examined with the above-signed Advanced Practice Provider and/or Housestaff. I personally reviewed laboratory data, imaging studies and relevant notes. I independently examined the patient and formulated the important aspects of the plan. I have edited the note to reflect any of my changes or salient points. I have personally discussed the plan with the patient and/or family.  Diuresing well on IV lasix. Breathing better. No further orthopnea or PND. CVP still high. Co-ox ok.   General:  Lying flat  No resp difficulty HEENT:  normal Neck: supple. + JVD. Carotids 2+ bilat; + bruits. No lymphadenopathy or thryomegaly appreciated. Cor: PMI nondisplaced. Regular rate & rhythm. + AS s2 absent Lungs: clear Abdomen: soft, nontender, nondistended. No hepatosplenomegaly. No bruits or masses. Good bowel sounds. Extremities: no cyanosis, clubbing, rash, tr edema Neuro: alert & orientedx3, cranial nerves grossly intact. moves all 4 extremities w/o difficulty. Affect pleasant  Continue IV diuresis. I have reviewed echo. AS is quite severe. Given recent clinical course with low output and multiple hospitalizations I think he would benefit most from undergoing TAVR first then proceeding with CRT upgrade as it will take some time for CRT to benefit him. I discussed this with him and the TAVR team. Tentative plan for TAVR next Tuesday. We will plan RHC tomorrow or Friday to make sure he is optimized beforehand.   Glori Bickers, MD  10:18 PM

## 2020-04-28 ENCOUNTER — Encounter (HOSPITAL_COMMUNITY)
Admission: AD | Disposition: A | Discharge status: Home / Self Care | Payer: Self-pay | Source: Ambulatory Visit | Attending: Internal Medicine

## 2020-04-28 ENCOUNTER — Encounter (HOSPITAL_COMMUNITY): Payer: Self-pay | Admitting: Internal Medicine

## 2020-04-28 DIAGNOSIS — I5043 Acute on chronic combined systolic (congestive) and diastolic (congestive) heart failure: Secondary | ICD-10-CM | POA: Diagnosis not present

## 2020-04-28 DIAGNOSIS — N1832 Chronic kidney disease, stage 3b: Secondary | ICD-10-CM | POA: Diagnosis not present

## 2020-04-28 DIAGNOSIS — I350 Nonrheumatic aortic (valve) stenosis: Secondary | ICD-10-CM | POA: Diagnosis not present

## 2020-04-28 DIAGNOSIS — I482 Chronic atrial fibrillation, unspecified: Secondary | ICD-10-CM | POA: Diagnosis not present

## 2020-04-28 HISTORY — PX: RIGHT HEART CATH: CATH118263

## 2020-04-28 LAB — BASIC METABOLIC PANEL
Anion gap: 11 (ref 5–15)
BUN: 15 mg/dL (ref 8–23)
CO2: 25 mmol/L (ref 22–32)
Calcium: 8 mg/dL — ABNORMAL LOW (ref 8.9–10.3)
Chloride: 99 mmol/L (ref 98–111)
Creatinine, Ser: 1.81 mg/dL — ABNORMAL HIGH (ref 0.61–1.24)
GFR, Estimated: 39 mL/min — ABNORMAL LOW (ref >60)
Glucose, Bld: 162 mg/dL — ABNORMAL HIGH (ref 70–99)
Potassium: 4.3 mmol/L (ref 3.5–5.1)
Sodium: 135 mmol/L (ref 135–145)

## 2020-04-28 LAB — GLUCOSE, CAPILLARY
Glucose-Capillary: 141 mg/dL — ABNORMAL HIGH (ref 70–99)
Glucose-Capillary: 147 mg/dL — ABNORMAL HIGH (ref 70–99)
Glucose-Capillary: 201 mg/dL — ABNORMAL HIGH (ref 70–99)
Glucose-Capillary: 157 mg/dL — ABNORMAL HIGH (ref 70–99)

## 2020-04-28 LAB — COOXEMETRY PANEL
Carboxyhemoglobin: 2.2 % — ABNORMAL HIGH (ref 0.5–1.5)
Methemoglobin: 0.7 % (ref 0.0–1.5)
O2 Saturation: 57.7 %
Total hemoglobin: 10.1 g/dL — ABNORMAL LOW (ref 12.0–16.0)

## 2020-04-28 LAB — POCT I-STAT EG7
Acid-Base Excess: 1 mmol/L (ref 0.0–2.0)
Acid-Base Excess: 1 mmol/L (ref 0.0–2.0)
Bicarbonate: 25.4 mmol/L (ref 20.0–28.0)
Bicarbonate: 25.9 mmol/L (ref 20.0–28.0)
Calcium, Ion: 0.99 mmol/L — ABNORMAL LOW (ref 1.15–1.40)
Calcium, Ion: 1.07 mmol/L — ABNORMAL LOW (ref 1.15–1.40)
HCT: 31 % — ABNORMAL LOW (ref 39.0–52.0)
HCT: 33 % — ABNORMAL LOW (ref 39.0–52.0)
Hemoglobin: 10.5 g/dL — ABNORMAL LOW (ref 13.0–17.0)
Hemoglobin: 11.2 g/dL — ABNORMAL LOW (ref 13.0–17.0)
O2 Saturation: 64 %
O2 Saturation: 66 %
Potassium: 4.2 mmol/L (ref 3.5–5.1)
Potassium: 4.5 mmol/L (ref 3.5–5.1)
Sodium: 137 mmol/L (ref 135–145)
Sodium: 139 mmol/L (ref 135–145)
TCO2: 27 mmol/L (ref 22–32)
TCO2: 27 mmol/L (ref 22–32)
pCO2, Ven: 39 mmHg — ABNORMAL LOW (ref 44.0–60.0)
pCO2, Ven: 39.4 mmHg — ABNORMAL LOW (ref 44.0–60.0)
pH, Ven: 7.421 (ref 7.250–7.430)
pH, Ven: 7.426 (ref 7.250–7.430)
pO2, Ven: 32 mmHg (ref 32.0–45.0)
pO2, Ven: 33 mmHg (ref 32.0–45.0)

## 2020-04-28 SURGERY — RIGHT HEART CATH
Anesthesia: LOCAL

## 2020-04-28 MED ORDER — ONDANSETRON HCL 4 MG/2ML IJ SOLN
4.0000 mg | Freq: Four times a day (QID) | INTRAMUSCULAR | Status: DC | PRN
  Administered 2020-05-10: 4 mg via INTRAVENOUS
  Filled 2020-04-28: qty 2

## 2020-04-28 MED ORDER — HYDRALAZINE HCL 20 MG/ML IJ SOLN: 10.0000 mg | INTRAMUSCULAR | Status: AC | PRN

## 2020-04-28 MED ORDER — SODIUM CHLORIDE 0.9% FLUSH: 3.0000 mL | INTRAVENOUS | Status: DC | PRN

## 2020-04-28 MED ORDER — SODIUM CHLORIDE 0.9 % IV SOLN: 250.0000 mL | INTRAVENOUS | Status: DC | PRN

## 2020-04-28 MED ORDER — LABETALOL HCL 5 MG/ML IV SOLN: 10.0000 mg | INTRAVENOUS | Status: AC | PRN

## 2020-04-28 MED ORDER — ACETAMINOPHEN 325 MG PO TABS
650.0000 mg | ORAL_TABLET | ORAL | Status: DC | PRN
  Administered 2020-04-29 – 2020-05-05 (×5): 650 mg via ORAL
  Filled 2020-04-28 (×5): qty 2

## 2020-04-28 MED ORDER — HEPARIN (PORCINE) IN NACL 1000-0.9 UT/500ML-% IV SOLN
INTRAVENOUS | Status: DC | PRN
  Administered 2020-04-28: 500 mL

## 2020-04-28 MED ORDER — SODIUM CHLORIDE 0.9 % IV SOLN: INTRAVENOUS | Status: DC

## 2020-04-28 MED ORDER — FUROSEMIDE 10 MG/ML IJ SOLN
80.0000 mg | Freq: Once | INTRAMUSCULAR | Status: DC
  Filled 2020-04-28: qty 8

## 2020-04-28 MED ORDER — HEPARIN (PORCINE) 25000 UT/250ML-% IV SOLN
1000.0000 [IU]/h | INTRAVENOUS | Status: DC
  Administered 2020-04-28 – 2020-04-29 (×2): 1000 [IU]/h via INTRAVENOUS
  Filled 2020-04-28 (×3): qty 250

## 2020-04-28 MED ORDER — LIDOCAINE HCL (PF) 1 % IJ SOLN
INTRAMUSCULAR | Status: AC
  Filled 2020-04-28: qty 30

## 2020-04-28 MED ORDER — DOBUTAMINE IN D5W 4-5 MG/ML-% IV SOLN
2.5000 ug/kg/min | INTRAVENOUS | Status: DC
  Administered 2020-04-28 – 2020-05-03 (×3): 2.5 ug/kg/min via INTRAVENOUS
  Filled 2020-04-28 (×4): qty 250

## 2020-04-28 MED ORDER — SODIUM CHLORIDE 0.9% FLUSH: 3.0000 mL | Freq: Two times a day (BID) | INTRAVENOUS | Status: DC

## 2020-04-28 MED ORDER — SODIUM CHLORIDE 0.9% FLUSH
3.0000 mL | Freq: Two times a day (BID) | INTRAVENOUS | Status: DC
  Administered 2020-04-28 – 2020-05-02 (×7): 3 mL via INTRAVENOUS

## 2020-04-28 MED ORDER — LIDOCAINE HCL (PF) 1 % IJ SOLN
INTRAMUSCULAR | Status: DC | PRN
  Administered 2020-04-28: 5 mL

## 2020-04-28 MED ORDER — HEPARIN (PORCINE) IN NACL 1000-0.9 UT/500ML-% IV SOLN
INTRAVENOUS | Status: AC
  Filled 2020-04-28: qty 500

## 2020-04-28 SURGICAL SUPPLY — 10 items
BAG SNAP BAND KOVER 36X36 (MISCELLANEOUS) ×1 IMPLANT
CATH SWAN GANZ 7F STRAIGHT (CATHETERS) ×1 IMPLANT
COVER DOME SNAP 22 D (MISCELLANEOUS) ×1 IMPLANT
PACK CARDIAC CATHETERIZATION (CUSTOM PROCEDURE TRAY) ×2 IMPLANT
SHEATH PINNACLE 7F 10CM (SHEATH) ×1 IMPLANT
SHEATH PROBE COVER 6X72 (BAG) ×1 IMPLANT
TRANSDUCER W/STOPCOCK (MISCELLANEOUS) ×2 IMPLANT
TUBING ART PRESS 72  MALE/FEM (TUBING) ×2
TUBING ART PRESS 72 MALE/FEM (TUBING) IMPLANT
WIRE EMERALD 3MM-J .025X260CM (WIRE) ×1 IMPLANT

## 2020-04-28 NOTE — Progress Notes (Signed)
Back from the cath lab awake and alert

## 2020-04-28 NOTE — Care Management Important Message (Signed)
Important Message  Patient Details  Name: John [name] MRN: [id number] Date of Birth: 05/12/1945   Medicare Important Message Given:  Yes     Orbie Pyo 04/28/2020, 2:26 PM

## 2020-04-28 NOTE — Evaluation (Signed)
Physical Therapy Evaluation Patient Details Name: John [name] MRN: [id number] DOB: 07-13-1945 Today's Date: 04/28/2020   History of Present Illness  Pt admit with chronic heart failure and Afib. Pt work up for TAVR.  Clinical Impression  Pt admitted with above diagnosis. Pt was able to perform Pre-TAVR assessment.  Pt needing min assist overall for mobility and needs RW which he has at home.  Wife supportive. Pt should progress well.  Pt currently with functional limitations due to the deficits listed below (see PT Problem List). Pt will benefit from skilled PT to increase their independence and safety with mobility to allow discharge to the venue listed below.      04/28/2020 PT TAVR Pre-Assessment Clinical Impression Statement: Pt is a 75 y.o.male being assessed for pre-TAVR.  Pt reports symptoms of dyspnea with activity.  Pt has WFL strength, WFL ROM, and fair balance.  Pt ambulated  515 ft during the 6 minute walk test requiring without rest breaks with max HR of 95bpm, lowest O2 sat 94%, BP 120/59 during mobility.  5 meter walk test produced an average gait speed of 1.74 ft/second which indicates high fall risk.  RPE was 11 and dyspnea was 3 during mobility.  Pt was limited by DOE and fatigue.  Pt's frailty rating was 5  which is considered moderately frail.  Pt would  benefit from continued PT in the acute care setting due to the above listed deficits in balance, strength, ROM, endurance and activity tolerance.    6 Minute Walk Test:   Total Distance Walked:515 ft.    Did the pt need a rest break? no Comments: Pt did well overall during 6 min walk test.   Pre-Test Post-Test  BP 96/48 120/59  HR 60 95  O2 saturations (indicated RA or L/min [place]) 94 95  Modified Borg Dyspnea Scale (0 none-10 maximal) 0 3  RPE (6 very light-10 very hard) 6 11  Comments: Tolerated well  5 Meter Walk Test:  Trial 1 9.36 seconds  Trial 2 8.39 seconds  Trial 3 10.83 seconds  3 Trial Average/Gait  Speed 1.74 seconds/16.4 ft/sec (<1.8 ft/sec indicates high fall risk)  Comments: Pt gait speed 1.74 feet per second therefore high fall risk.    Clinical Frailty Scale (1 very fit - 9 terminally ill): 5    Follow Up Recommendations Home health PT;Supervision/Assistance - 24 hour    Equipment Recommendations  None recommended by PT    Recommendations for Other Services       Precautions / Restrictions Precautions Precautions: Fall Restrictions Weight Bearing Restrictions: No      Mobility  Bed Mobility               General bed mobility comments: in chair    Transfers Overall transfer level: Needs assistance Equipment used: Rolling walker (2 wheeled) Transfers: Sit to/from Stand Sit to Stand: Min guard         General transfer comment: incr time needed to stand but did on his own.  Ambulation/Gait Ambulation/Gait assistance: Min guard Gait Distance (Feet): 515 Feet Assistive device: Rolling walker (2 wheeled) Gait Pattern/deviations: Step-through pattern;Decreased stride length;Trunk flexed;Wide base of support   Gait velocity interpretation: <1.31 ft/sec, indicative of household ambulator General Gait Details: Pt was able to ambulate to bathroom and clean himself. then ambulated in hallway and perform TAVR assessment.  Stairs            Wheelchair Mobility    Modified Rankin (Stroke Patients Only)  Balance Overall balance assessment: Needs assistance Sitting-balance support: No upper extremity supported;Feet supported Sitting balance-Leahy Scale: Fair     Standing balance support: Bilateral upper extremity supported;During functional activity Standing balance-Leahy Scale: Poor Standing balance comment: relies on UES for support                             Pertinent Vitals/Pain Pain Assessment: No/denies pain    Home Living Family/patient expects to be discharged to:: Private residence Living Arrangements:  Spouse/significant other Available Help at Discharge: Family;Available 24 hours/day Type of Home: House Home Access: Level entry     Home Layout: One level;Able to live on main level with bedroom/bathroom;Two level;Laundry or work area in [place]: Kasandra Knudsen - single point;Shower seat - built in;Walker - 2 wheels;Grab bars - toilet;Grab bars - tub/shower;Wheelchair - manual      Prior Function Level of Independence: Needs assistance   Gait / Transfers Assistance Needed: I without device withambulation  ADL's / Homemaking Assistance Needed: wife assisted with washing back        Hand Dominance   Dominant Hand: Right    Extremity/Trunk Assessment   Upper Extremity Assessment Upper Extremity Assessment: Defer to OT evaluation    Lower Extremity Assessment Lower Extremity Assessment: Generalized weakness    Cervical / Trunk Assessment Cervical / Trunk Assessment: Kyphotic  Communication   Communication: No difficulties  Cognition Arousal/Alertness: Awake/alert Behavior During Therapy: WFL for tasks assessed/performed Overall Cognitive Status: Within Functional Limits for tasks assessed                                        General Comments      Exercises     Assessment/Plan    PT Assessment Patient needs continued PT services  PT Problem List Decreased activity tolerance;Decreased balance;Decreased mobility;Decreased knowledge of use of DME;Decreased safety awareness;Decreased knowledge of precautions;Cardiopulmonary status limiting activity       PT Treatment Interventions DME instruction;Gait training;Functional mobility training;Therapeutic activities;Therapeutic exercise;Balance training;Patient/family education    PT Goals (Current goals can be found in the Care Plan section)  Acute Rehab PT Goals Patient Stated Goal: to go home PT Goal Formulation: With patient/family Time For Goal Achievement: 05/12/20 Potential to Achieve  Goals: Good    Frequency Min 3X/week   Barriers to discharge        Co-evaluation               AM-PAC PT "6 Clicks" Mobility  Outcome Measure Help needed turning from your back to your side while in a flat bed without using bedrails?: None Help needed moving from lying on your back to sitting on the side of a flat bed without using bedrails?: None Help needed moving to and from a bed to a chair (including a wheelchair)?: A Little Help needed standing up from a chair using your arms (e.g., wheelchair or bedside chair)?: A Little Help needed to walk in hospital room?: A Little Help needed climbing 3-5 steps with a railing? : A Little 6 Click Score: 20    End of Session Equipment Utilized During Treatment: Gait belt Activity Tolerance: Patient limited by fatigue Patient left: in chair;with call bell/phone within reach;with chair alarm set;with family/visitor present Nurse Communication: Mobility status PT Visit Diagnosis: Muscle weakness (generalized) (M62.81)    Time: 2458-0998 PT Time Calculation (min) (ACUTE ONLY):  35 min   Charges:   PT Evaluation $PT Eval Moderate Complexity: 1 Mod PT Treatments $Gait Training: 8-22 mins        Tasheika Kitzmiller W,PT Acute Rehabilitation Services Pager:  [phone]  Office:  [place] 04/28/2020, 2:15 PM

## 2020-04-28 NOTE — Progress Notes (Signed)
Transported to the cath lab by bed awake and alert.

## 2020-04-28 NOTE — Progress Notes (Addendum)
Advanced Heart Failure Rounding Note   Subjective:    CVP down to 13, net neg 1.4L.  Says he feels better than he has in a while.  No dyspnea orthopnea or chest pain.    Objective:   Weight Range:  Vital Signs:   Temp:  [97.4 F (36.3 C)-98 F (36.7 C)] 98 F (36.7 C) (02/03 0440) Pulse Rate:  [63-65] 63 (02/02 1553) Resp:  [18-20] 18 (02/03 0440) BP: (100-121)/(54-79) 108/62 (02/03 0440) SpO2:  [94 %-99 %] 96 % (02/03 0440) Weight:  [105.9 kg] 105.9 kg (02/03 0445)    Weight change: Filed Weights   04/26/20 0301 04/27/20 0507 04/28/20 0445  Weight: 106.7 kg 105.8 kg 105.9 kg    Intake/Output:   Intake/Output Summary (Last 24 hours) at 04/28/2020 0758 Last data filed at 04/28/2020 3267 Gross per 24 hour  Intake 697 ml  Output 2085 ml  Net -1388 ml     Physical Exam: Cardiac: neck veins distended, normal rate and rhythm, diminished S2, 3/6 AS murmur, interval improvement in LE edema with legs in unna boots Pulmonary: CTAB, not in distress Abdominal: non distended abdomen, soft and nontender Psych: Alert, conversant, in good spirits   Telemetry: AF v paced Personally reviewed   Labs: Basic Metabolic Panel: Recent Labs  Lab 04/25/20 1157 04/26/20 0308 04/27/20 0500 04/28/20 0034  NA 138 136 137 135  K 4.5 3.9 4.1 4.3  CL 101 100 102 99  CO2 26 26 25 25   GLUCOSE 179* 77 70 162*  BUN 16 15 14 15   CREATININE 1.66* 1.44* 1.56* 1.81*  CALCIUM 8.7* 8.7* 7.8* 8.0*    Liver Function Tests: No results for input(s): AST, ALT, ALKPHOS, BILITOT, PROT, ALBUMIN in the last 168 hours. No results for input(s): LIPASE, AMYLASE in the last 168 hours. No results for input(s): AMMONIA in the last 168 hours.  CBC: Recent Labs  Lab 04/25/20 1157  WBC 6.8  HGB 14.2  HCT 42.9  MCV 94.5  PLT 145*    Cardiac Enzymes: No results for input(s): CKTOTAL, CKMB, CKMBINDEX, TROPONINI in the last 168 hours.  BNP: BNP (last 3 results) Recent Labs    03/09/20 1544  03/20/20 1253 04/25/20 1157  BNP 1,540.0* 2,072.0* 2,410.2*    ProBNP (last 3 results) No results for input(s): PROBNP in the last 8760 hours.    Other results:  Imaging: ECHOCARDIOGRAM COMPLETE  Result Date: 04/26/2020    ECHOCARDIOGRAM REPORT   Patient Name:   John [name] Date of Exam: 04/26/2020 Medical Rec #:  [id number]        Height:       72.0 in Accession #:    [id number]       Weight:       235.2 lb Date of Birth:  1945/08/19        BSA:          2.283 m Patient Age:    75 years         BP:           122/76 mmHg Patient Gender: M                HR:           67 bpm. Exam Location:  Inpatient Procedure: 2D Echo, Color Doppler and Cardiac Doppler Indications:    Aortic Stenosis i35.0  History:        Patient has prior history of Echocardiogram examinations, most  recent 11/06/2019. CHF, Pacemaker, Arrythmias:Atrial                 Fibrillation; Risk Factors:Hypertension, Diabetes and                 Dyslipidemia.  Sonographer:    Raquel Sarna Senior RDCS Referring Phys: [id number] Devlin Mcveigh R Lamyra Malcolm IMPRESSIONS  1. Left ventricular ejection fraction, by estimation, is 30 to 35%. The left ventricle has moderately decreased function. The left ventricle demonstrates global hypokinesis. The left ventricular internal cavity size was moderately dilated. Left ventricular diastolic function could not be evaluated.  2. Right ventricular systolic function is mildly reduced. The right ventricular size is moderately enlarged. There is moderately elevated pulmonary artery systolic pressure. The estimated right ventricular systolic pressure is 76.5 mmHg.  3. Left atrial size was severely dilated.  4. Right atrial size was severely dilated.  5. The mitral valve is grossly structurally normal. Severe mitral valve regurgitation with a central jet, consistent with sec.  6. Tricuspid valve regurgitation is mild to moderate.  7. Findings suggest severe low flow, low gradient aortic stenosis with low cardiac  output. The aortic valve is functionally bicuspid (fused right and left cusps). There is moderate calcification of the aortic valve. There is severe thickening of the aortic valve. Aortic valve regurgitation is not visualized.  8. The inferior vena cava is dilated in size with <50% respiratory variability, suggesting right atrial pressure of 15 mmHg. Comparison(s): A prior study was performed on TEE 11/05/2019. Mitral insufficiency is substantially worse on the current study. FINDINGS  Left Ventricle: There is marked systolic dyssynchrony (apex to base and septum to lateral wall). Left ventricular ejection fraction, by estimation, is 30 to 35%. The left ventricle has moderately decreased function. The left ventricle demonstrates global hypokinesis. The left ventricular internal cavity size was moderately dilated. There is no left ventricular hypertrophy. Abnormal (paradoxical) septal motion, consistent with left bundle branch block and abnormal (paradoxical) septal motion, consistent with RV pacemaker. Left ventricular diastolic function could not be evaluated due to atrial fibrillation. Left ventricular diastolic function could not be evaluated. Right Ventricle: The right ventricular size is moderately enlarged. No increase in right ventricular wall thickness. Right ventricular systolic function is mildly reduced. There is moderately elevated pulmonary artery systolic pressure. The tricuspid regurgitant velocity is 2.87 m/s, and with an assumed right atrial pressure of 15 mmHg, the estimated right ventricular systolic pressure is 46.5 mmHg. Left Atrium: Left atrial size was severely dilated. Right Atrium: Right atrial size was severely dilated. Pericardium: There is no evidence of pericardial effusion. Mitral Valve: There are findings of severe functional MR due to chamber dilation and leaflet malcoaptation. Quantitation challenging due to atrial fibrillation (regurgitant volume 50 ml, regurgitant fraction 53%,  EROA 0.32 cm), but there is systolic flow reversal in the pulmonary veins. The mitral valve is grossly normal. Severe mitral valve regurgitation, with centrally-directed jet. Pulmonary venous flow shows systolic flow reversal. Tricuspid Valve: The tricuspid valve is normal in structure. Tricuspid valve regurgitation is mild to moderate. Aortic Valve: Findings suggest severe low flow, low gradient aortic stenosis with low cardiac output. The aortic valve is bicuspid. There is moderate calcification of the aortic valve. There is severe thickening of the aortic valve. Aortic valve regurgitation is not visualized. Severe aortic stenosis is present. Aortic valve mean gradient measures 21.0 mmHg. Aortic valve peak gradient measures 39.2 mmHg. Aortic valve area, by VTI measures 0.87 cm. Pulmonic Valve: The pulmonic valve was normal in structure. Pulmonic valve  regurgitation is not visualized. Aorta: The aortic root and ascending aorta are structurally normal, with no evidence of dilitation. Venous: The inferior vena cava is dilated in size with less than 50% respiratory variability, suggesting right atrial pressure of 15 mmHg. IAS/Shunts: No atrial level shunt detected by color flow Doppler. Additional Comments: A pacer wire is visualized.  LEFT VENTRICLE PLAX 2D LVIDd:         6.40 cm      Diastology LVIDs:         4.90 cm      LV e' medial:    3.98 cm/s LV PW:         1.00 cm      LV E/e' medial:  27.6 LV IVS:        0.80 cm      LV e' lateral:   9.30 cm/s LVOT diam:     2.30 cm      LV E/e' lateral: 11.8 LV SV:         52 LV SV Index:   23 LVOT Area:     4.15 cm  LV Volumes (MOD) LV vol d, MOD A2C: 155.0 ml LV vol d, MOD A4C: 154.0 ml LV vol s, MOD A2C: 93.9 ml LV vol s, MOD A4C: 95.0 ml LV SV MOD A2C:     61.1 ml LV SV MOD A4C:     154.0 ml LV SV MOD BP:      61.1 ml RIGHT VENTRICLE RV S prime:     6.38 cm/s TAPSE (M-mode): 1.6 cm LEFT ATRIUM              Index       RIGHT ATRIUM           Index LA diam:        5.60  cm  2.45 cm/m  RA Area:     21.80 cm LA Vol (A2C):   103.0 ml 45.12 ml/m RA Volume:   59.10 ml  25.89 ml/m LA Vol (A4C):   64.5 ml  28.26 ml/m LA Biplane Vol: 89.1 ml  39.03 ml/m  AORTIC VALVE AV Area (Vmax):    0.79 cm AV Area (Vmean):   0.79 cm AV Area (VTI):     0.87 cm AV Vmax:           313.00 cm/s AV Vmean:          208.000 cm/s AV VTI:            0.600 m AV Peak Grad:      39.2 mmHg AV Mean Grad:      21.0 mmHg LVOT Vmax:         59.20 cm/s LVOT Vmean:        39.600 cm/s LVOT VTI:          0.125 m LVOT/AV VTI ratio: 0.21  AORTA Ao Root diam: 2.70 cm Ao Asc diam:  3.30 cm MITRAL VALVE                 TRICUSPID VALVE MV Area (PHT): 4.21 cm      TR Peak grad:   32.9 mmHg MV Decel Time: 180 msec      TR Vmax:        287.00 cm/s MR Peak grad:    93.3 mmHg MR Mean grad:    63.0 mmHg   SHUNTS MR Vmax:         483.00 cm/s Systemic VTI:  0.12 m  MR Vmean:        375.0 cm/s  Systemic Diam: 2.30 cm MR PISA:         4.02 cm MR PISA Eff ROA: 32 mm MR PISA Radius:  0.80 cm MV E velocity: 110.00 cm/s Dani Gobble Croitoru MD Electronically signed by Sanda Klein MD Signature Date/Time: 04/26/2020/1:56:20 PM    Final      Medications:     Scheduled Medications: . apixaban  5 mg Oral BID  . Chlorhexidine Gluconate Cloth  6 each Topical Daily  . dapagliflozin propanediol  10 mg Oral QAC breakfast  . furosemide  80 mg Intravenous BID  . insulin aspart  0-9 Units Subcutaneous TID WC  . insulin glargine  5 Units Subcutaneous QHS  . potassium chloride SA  20 mEq Oral BID  . sodium chloride flush  10-40 mL Intracatheter Q12H  . sodium chloride flush  3 mL Intravenous Q12H    Infusions: . sodium chloride      PRN Medications: sodium chloride, acetaminophen, diclofenac Sodium, sodium chloride flush, sodium chloride flush   Assessment/Plan:   1. Acute on Chronic Combined Systolic and Diastolic Heart Failure/NICM: - Echo 08/2015 EF 50-55% (PPM placed in 08/2015)  - Echo 10/2015 EF dropped to 40-45% -  Echo 2018 EF 45% - Echo 2021 EF 30-35%, RV mildly reduced  - based on timing of drop in EF and 42% pacing percentage, concern for RV paced CM. Aortic valve also appears severely calcified and moderate-severely stenosed, suspect AS also contributing to CM. Had recent [facility] 9/21 that showed no coronary disease.  -Continues to diurese, cr up a bit, CVP down to 14.  Give morning dose of lasix. Will plan for RHC this afternoon to help further understand his physiology, may require dobutamine to diurese any further -TAVR tentatively scheduled for Tuesday 05/02/20  2. Mitral Regurgitation  - Moderate by TEE 8/21. -Severe by TTE 04/26/20 and consistent with severe functional MR  3. Aortic Stenosis  - severe low flow/low gradient aortic stenosis with mean gradient 15.5 mmHg, dimensionless index 0.23, AVA 0.86 cm2  -ECHO 2/1 with similar findings mean gradient 21, AVA 0.87 cm2 -TAVR tentatively scheduled for Tuesday 05/02/20    4. Atrial Flutter/ Chronic Atrial Fibrillation - s/p AFL ablation. -In chronic Afib, rate controlled. -Continue Eliquis. No bleeding issues. Will need to switch to heparin prior to TAVR - continue holding bb  5. H/o CHB  - s/p PPM followed by Dr. Lovena Le  - ? RV paced CM. He is pacing ~40%. - Will optimize patient's volume and discuss with EP BIV upgrade after TAVR  6. NASH cirrhosis -followed by Dr. Jenetta Downer, NAFLD socre 2.90 consistent with advanced fibrosis -Elastography ordered but not yet complete -no history of bleeding or known varices  7. Stage IIIb CKD -Followed by nephrology. -Baseline SCr ~1.5-1.8 - Creatinine up slightly to 1.81  8. T2DM -On insulin.a1C 6.8 (12/21) -On farxiga - no further hypoglycemia on current basal ssi regimen  Length of Stay: 3   Katherine Roan MD 04/28/2020, 7:58 AM  Advanced Heart Failure Team Pager [phone] (M-F; [place])  Please contact [facility] for night-coverage after hours (4p -7a ) and  weekends on [email or web address]  Patient seen and examined with the above-signed Advanced Practice Provider and/or Housestaff. I personally reviewed laboratory data, imaging studies and relevant notes. I independently examined the patient and formulated the important aspects of the plan. I have edited the note to reflect any of my changes or salient  points. I have personally discussed the plan with the patient and/or family.  Feeling much better. No orthopnea or PND. CVP remains high 12-14. SBP in 90s  General:  Well appearing. No resp difficulty HEENT: normal Neck: supple. no JVD. Carotids 2+ bilat; + bruits. No lymphadenopathy or thryomegaly appreciated. Cor: PMI nondisplaced. Regular rate & rhythm. + AS s2 markedly diminished Lungs: clear Abdomen: soft, nontender, nondistended. No hepatosplenomegaly. No bruits or masses. Good bowel sounds. Extremities: no cyanosis, clubbing, rash, edema Neuro: alert & orientedx3, cranial nerves grossly intact. moves all 4 extremities w/o difficulty. Affect pleasant  D/w TAVR team. Plan TAVR Tuesday next week. Will plan RHC today to help further optimize. May need dobutamine to help keep BP up. Eventual CRT upgrade.   Glori Bickers, MD  11:16 AM

## 2020-04-28 NOTE — Progress Notes (Signed)
ANTICOAGULATION CONSULT NOTE - Initial Consult  Pharmacy Consult for heparin while apixaban on hold  Indication: atrial fibrillation  No Known Allergies  Patient Measurements: Weight: 105.9 kg (233 lb 7.5 oz) Heparin Dosing Weight: 100kg   Vital Signs: Temp: 98.1 F (36.7 C) (02/03 0718) Temp Source: Oral (02/03 0718) BP: 108/62 (02/03 0440) Pulse Rate: 72 (02/03 0718)  Labs: Recent Labs    04/26/20 0308 04/27/20 0500 04/28/20 0034  CREATININE 1.44* 1.56* 1.81*    Estimated Creatinine Clearance: 45 mL/min (A) (by C-G formula based on SCr of 1.81 mg/dL (H)).   Medical History: Past Medical History:  Diagnosis Date  . Atrial flutter ([place]) 12/2010   Admitted with symptomatic bradycardia (HR 40s), atrial flutter with slow ventricular response 12/2010 + volume overload; AV nodal agents d/c'd and Pradaxa started; RFA in 01/2011  . CHF (congestive heart failure) ([place])   . Chronic combined systolic and diastolic heart failure ([place])    a. echo 01/06/11: mild LVH, EF 65-70%, mod to severe LAE, mild RVE, mild RAE, PASP 32;   TEE 10/12: EF 45-50% b. EF 45 to 50% by echo in 07/2018  . Class 2 severe obesity due to excess calories with serious comorbidity and body mass index (BMI) of 37.0 to 37.9 in adult ([place]) 07/17/2017  . Diabetes mellitus    non insulin dependant  . Hyperlipidemia   . Hypertension   . Osteoarthritis   . Presence of permanent cardiac pacemaker   . PSVT (paroxysmal supraventricular tachycardia) ([facility])    Possibly atrial flutter  . Severe aortic stenosis      Assessment: 74yom with Afib on apixaban.  EF 30% diuresed and volume status stable.  Plan for TAVR next week.  Will hold apixaban for upcoming procedure and bridge with heparin.  Last dose apixaban this am start heparin drip this pm. Will check aptt and heparin level and dose heparin with aptt until the 2 correlate as apixaban can falsely elevate the heparin level.    Goal of Therapy:  Heparin level  0.3-0.7 units/ml  aptt 66-102 sec Monitor platelets by anticoagulation protocol: Yes   Plan:  Stop apixaban  No bolus  Start heparin drip 1000 uts/hr at 10pm tonight  Check heparin level and aptt and CBC daily   Bonnita Nasuti Pharm.D. CPP, BCPS Clinical Pharmacist [phone] 04/28/2020 12:12 PM

## 2020-04-28 NOTE — Plan of Care (Signed)
  Problem: Education: Goal: Knowledge of General Education information will improve Description Including pain rating scale, medication(s)/side effects and non-pharmacologic comfort measures Outcome: Progressing   

## 2020-04-28 NOTE — H&P (View-Only) (Signed)
Advanced Heart Failure Rounding Note   Subjective:    CVP down to 13, net neg 1.4L.  Says he feels better than he has in a while.  No dyspnea orthopnea or chest pain.    Objective:   Weight Range:  Vital Signs:   Temp:  [97.4 F (36.3 C)-98 F (36.7 C)] 98 F (36.7 C) (02/03 0440) Pulse Rate:  [63-65] 63 (02/02 1553) Resp:  [18-20] 18 (02/03 0440) BP: (100-121)/(54-79) 108/62 (02/03 0440) SpO2:  [94 %-99 %] 96 % (02/03 0440) Weight:  [105.9 kg] 105.9 kg (02/03 0445)    Weight change: Filed Weights   04/26/20 0301 04/27/20 0507 04/28/20 0445  Weight: 106.7 kg 105.8 kg 105.9 kg    Intake/Output:   Intake/Output Summary (Last 24 hours) at 04/28/2020 0758 Last data filed at 04/28/2020 5329 Gross per 24 hour  Intake 697 ml  Output 2085 ml  Net -1388 ml     Physical Exam: Cardiac: neck veins distended, normal rate and rhythm, diminished S2, 3/6 AS murmur, interval improvement in LE edema with legs in unna boots Pulmonary: CTAB, not in distress Abdominal: non distended abdomen, soft and nontender Psych: Alert, conversant, in good spirits   Telemetry: AF v paced Personally reviewed   Labs: Basic Metabolic Panel: Recent Labs  Lab 04/25/20 1157 04/26/20 0308 04/27/20 0500 04/28/20 0034  NA 138 136 137 135  K 4.5 3.9 4.1 4.3  CL 101 100 102 99  CO2 26 26 25 25   GLUCOSE 179* 77 70 162*  BUN 16 15 14 15   CREATININE 1.66* 1.44* 1.56* 1.81*  CALCIUM 8.7* 8.7* 7.8* 8.0*    Liver Function Tests: No results for input(s): AST, ALT, ALKPHOS, BILITOT, PROT, ALBUMIN in the last 168 hours. No results for input(s): LIPASE, AMYLASE in the last 168 hours. No results for input(s): AMMONIA in the last 168 hours.  CBC: Recent Labs  Lab 04/25/20 1157  WBC 6.8  HGB 14.2  HCT 42.9  MCV 94.5  PLT 145*    Cardiac Enzymes: No results for input(s): CKTOTAL, CKMB, CKMBINDEX, TROPONINI in the last 168 hours.  BNP: BNP (last 3 results) Recent Labs    03/09/20 1544  03/20/20 1253 04/25/20 1157  BNP 1,540.0* 2,072.0* 2,410.2*    ProBNP (last 3 results) No results for input(s): PROBNP in the last 8760 hours.    Other results:  Imaging: ECHOCARDIOGRAM COMPLETE  Result Date: 04/26/2020    ECHOCARDIOGRAM REPORT   Patient Name:   John [name] Date of Exam: 04/26/2020 Medical Rec #:  [id number]        Height:       72.0 in Accession #:    [id number]       Weight:       235.2 lb Date of Birth:  April 26, 1945        BSA:          2.283 m Patient Age:    75 years         BP:           122/76 mmHg Patient Gender: M                HR:           67 bpm. Exam Location:  Inpatient Procedure: 2D Echo, Color Doppler and Cardiac Doppler Indications:    Aortic Stenosis i35.0  History:        Patient has prior history of Echocardiogram examinations, most  recent 11/06/2019. CHF, Pacemaker, Arrythmias:Atrial                 Fibrillation; Risk Factors:Hypertension, Diabetes and                 Dyslipidemia.  Sonographer:    Raquel Sarna Senior RDCS Referring Phys: [id number] Jaycub Noorani R Shashana Fullington IMPRESSIONS  1. Left ventricular ejection fraction, by estimation, is 30 to 35%. The left ventricle has moderately decreased function. The left ventricle demonstrates global hypokinesis. The left ventricular internal cavity size was moderately dilated. Left ventricular diastolic function could not be evaluated.  2. Right ventricular systolic function is mildly reduced. The right ventricular size is moderately enlarged. There is moderately elevated pulmonary artery systolic pressure. The estimated right ventricular systolic pressure is 67.1 mmHg.  3. Left atrial size was severely dilated.  4. Right atrial size was severely dilated.  5. The mitral valve is grossly structurally normal. Severe mitral valve regurgitation with a central jet, consistent with sec.  6. Tricuspid valve regurgitation is mild to moderate.  7. Findings suggest severe low flow, low gradient aortic stenosis with low cardiac  output. The aortic valve is functionally bicuspid (fused right and left cusps). There is moderate calcification of the aortic valve. There is severe thickening of the aortic valve. Aortic valve regurgitation is not visualized.  8. The inferior vena cava is dilated in size with <50% respiratory variability, suggesting right atrial pressure of 15 mmHg. Comparison(s): A prior study was performed on TEE 11/05/2019. Mitral insufficiency is substantially worse on the current study. FINDINGS  Left Ventricle: There is marked systolic dyssynchrony (apex to base and septum to lateral wall). Left ventricular ejection fraction, by estimation, is 30 to 35%. The left ventricle has moderately decreased function. The left ventricle demonstrates global hypokinesis. The left ventricular internal cavity size was moderately dilated. There is no left ventricular hypertrophy. Abnormal (paradoxical) septal motion, consistent with left bundle branch block and abnormal (paradoxical) septal motion, consistent with RV pacemaker. Left ventricular diastolic function could not be evaluated due to atrial fibrillation. Left ventricular diastolic function could not be evaluated. Right Ventricle: The right ventricular size is moderately enlarged. No increase in right ventricular wall thickness. Right ventricular systolic function is mildly reduced. There is moderately elevated pulmonary artery systolic pressure. The tricuspid regurgitant velocity is 2.87 m/s, and with an assumed right atrial pressure of 15 mmHg, the estimated right ventricular systolic pressure is 24.5 mmHg. Left Atrium: Left atrial size was severely dilated. Right Atrium: Right atrial size was severely dilated. Pericardium: There is no evidence of pericardial effusion. Mitral Valve: There are findings of severe functional MR due to chamber dilation and leaflet malcoaptation. Quantitation challenging due to atrial fibrillation (regurgitant volume 50 ml, regurgitant fraction 53%,  EROA 0.32 cm), but there is systolic flow reversal in the pulmonary veins. The mitral valve is grossly normal. Severe mitral valve regurgitation, with centrally-directed jet. Pulmonary venous flow shows systolic flow reversal. Tricuspid Valve: The tricuspid valve is normal in structure. Tricuspid valve regurgitation is mild to moderate. Aortic Valve: Findings suggest severe low flow, low gradient aortic stenosis with low cardiac output. The aortic valve is bicuspid. There is moderate calcification of the aortic valve. There is severe thickening of the aortic valve. Aortic valve regurgitation is not visualized. Severe aortic stenosis is present. Aortic valve mean gradient measures 21.0 mmHg. Aortic valve peak gradient measures 39.2 mmHg. Aortic valve area, by VTI measures 0.87 cm. Pulmonic Valve: The pulmonic valve was normal in structure. Pulmonic valve  regurgitation is not visualized. Aorta: The aortic root and ascending aorta are structurally normal, with no evidence of dilitation. Venous: The inferior vena cava is dilated in size with less than 50% respiratory variability, suggesting right atrial pressure of 15 mmHg. IAS/Shunts: No atrial level shunt detected by color flow Doppler. Additional Comments: A pacer wire is visualized.  LEFT VENTRICLE PLAX 2D LVIDd:         6.40 cm      Diastology LVIDs:         4.90 cm      LV e' medial:    3.98 cm/s LV PW:         1.00 cm      LV E/e' medial:  27.6 LV IVS:        0.80 cm      LV e' lateral:   9.30 cm/s LVOT diam:     2.30 cm      LV E/e' lateral: 11.8 LV SV:         52 LV SV Index:   23 LVOT Area:     4.15 cm  LV Volumes (MOD) LV vol d, MOD A2C: 155.0 ml LV vol d, MOD A4C: 154.0 ml LV vol s, MOD A2C: 93.9 ml LV vol s, MOD A4C: 95.0 ml LV SV MOD A2C:     61.1 ml LV SV MOD A4C:     154.0 ml LV SV MOD BP:      61.1 ml RIGHT VENTRICLE RV S prime:     6.38 cm/s TAPSE (M-mode): 1.6 cm LEFT ATRIUM              Index       RIGHT ATRIUM           Index LA diam:        5.60  cm  2.45 cm/m  RA Area:     21.80 cm LA Vol (A2C):   103.0 ml 45.12 ml/m RA Volume:   59.10 ml  25.89 ml/m LA Vol (A4C):   64.5 ml  28.26 ml/m LA Biplane Vol: 89.1 ml  39.03 ml/m  AORTIC VALVE AV Area (Vmax):    0.79 cm AV Area (Vmean):   0.79 cm AV Area (VTI):     0.87 cm AV Vmax:           313.00 cm/s AV Vmean:          208.000 cm/s AV VTI:            0.600 m AV Peak Grad:      39.2 mmHg AV Mean Grad:      21.0 mmHg LVOT Vmax:         59.20 cm/s LVOT Vmean:        39.600 cm/s LVOT VTI:          0.125 m LVOT/AV VTI ratio: 0.21  AORTA Ao Root diam: 2.70 cm Ao Asc diam:  3.30 cm MITRAL VALVE                 TRICUSPID VALVE MV Area (PHT): 4.21 cm      TR Peak grad:   32.9 mmHg MV Decel Time: 180 msec      TR Vmax:        287.00 cm/s MR Peak grad:    93.3 mmHg MR Mean grad:    63.0 mmHg   SHUNTS MR Vmax:         483.00 cm/s Systemic VTI:  0.12 m  MR Vmean:        375.0 cm/s  Systemic Diam: 2.30 cm MR PISA:         4.02 cm MR PISA Eff ROA: 32 mm MR PISA Radius:  0.80 cm MV E velocity: 110.00 cm/s Dani Gobble Croitoru MD Electronically signed by Sanda Klein MD Signature Date/Time: 04/26/2020/1:56:20 PM    Final      Medications:     Scheduled Medications: . apixaban  5 mg Oral BID  . Chlorhexidine Gluconate Cloth  6 each Topical Daily  . dapagliflozin propanediol  10 mg Oral QAC breakfast  . furosemide  80 mg Intravenous BID  . insulin aspart  0-9 Units Subcutaneous TID WC  . insulin glargine  5 Units Subcutaneous QHS  . potassium chloride SA  20 mEq Oral BID  . sodium chloride flush  10-40 mL Intracatheter Q12H  . sodium chloride flush  3 mL Intravenous Q12H    Infusions: . sodium chloride      PRN Medications: sodium chloride, acetaminophen, diclofenac Sodium, sodium chloride flush, sodium chloride flush   Assessment/Plan:   1. Acute on Chronic Combined Systolic and Diastolic Heart Failure/NICM: - Echo 08/2015 EF 50-55% (PPM placed in 08/2015)  - Echo 10/2015 EF dropped to 40-45% -  Echo 2018 EF 45% - Echo 2021 EF 30-35%, RV mildly reduced  - based on timing of drop in EF and 42% pacing percentage, concern for RV paced CM. Aortic valve also appears severely calcified and moderate-severely stenosed, suspect AS also contributing to CM. Had recent [facility] 9/21 that showed no coronary disease.  -Continues to diurese, cr up a bit, CVP down to 14.  Give morning dose of lasix. Will plan for RHC this afternoon to help further understand his physiology, may require dobutamine to diurese any further -TAVR tentatively scheduled for Tuesday 05/02/20  2. Mitral Regurgitation  - Moderate by TEE 8/21. -Severe by TTE 04/26/20 and consistent with severe functional MR  3. Aortic Stenosis  - severe low flow/low gradient aortic stenosis with mean gradient 15.5 mmHg, dimensionless index 0.23, AVA 0.86 cm2  -ECHO 2/1 with similar findings mean gradient 21, AVA 0.87 cm2 -TAVR tentatively scheduled for Tuesday 05/02/20    4. Atrial Flutter/ Chronic Atrial Fibrillation - s/p AFL ablation. -In chronic Afib, rate controlled. -Continue Eliquis. No bleeding issues. Will need to switch to heparin prior to TAVR - continue holding bb  5. H/o CHB  - s/p PPM followed by Dr. Lovena Le  - ? RV paced CM. He is pacing ~40%. - Will optimize patient's volume and discuss with EP BIV upgrade after TAVR  6. NASH cirrhosis -followed by Dr. Jenetta Downer, NAFLD socre 2.90 consistent with advanced fibrosis -Elastography ordered but not yet complete -no history of bleeding or known varices  7. Stage IIIb CKD -Followed by nephrology. -Baseline SCr ~1.5-1.8 - Creatinine up slightly to 1.81  8. T2DM -On insulin.a1C 6.8 (12/21) -On farxiga - no further hypoglycemia on current basal ssi regimen  Length of Stay: 3   Katherine Roan MD 04/28/2020, 7:58 AM  Advanced Heart Failure Team Pager [phone] (M-F; [place])  Please contact [facility] for night-coverage after hours (4p -7a ) and  weekends on [email or web address]  Patient seen and examined with the above-signed Advanced Practice Provider and/or Housestaff. I personally reviewed laboratory data, imaging studies and relevant notes. I independently examined the patient and formulated the important aspects of the plan. I have edited the note to reflect any of my changes or salient  points. I have personally discussed the plan with the patient and/or family.  Feeling much better. No orthopnea or PND. CVP remains high 12-14. SBP in 90s  General:  Well appearing. No resp difficulty HEENT: normal Neck: supple. no JVD. Carotids 2+ bilat; + bruits. No lymphadenopathy or thryomegaly appreciated. Cor: PMI nondisplaced. Regular rate & rhythm. + AS s2 markedly diminished Lungs: clear Abdomen: soft, nontender, nondistended. No hepatosplenomegaly. No bruits or masses. Good bowel sounds. Extremities: no cyanosis, clubbing, rash, edema Neuro: alert & orientedx3, cranial nerves grossly intact. moves all 4 extremities w/o difficulty. Affect pleasant  D/w TAVR team. Plan TAVR Tuesday next week. Will plan RHC today to help further optimize. May need dobutamine to help keep BP up. Eventual CRT upgrade.   Glori Bickers, MD  11:16 AM

## 2020-04-28 NOTE — Interval H&P Note (Signed)
History and Physical Interval Note:  04/28/2020 1:03 PM  John [name]  has presented today for surgery, with the diagnosis of severe aortic stenosis.  The various methods of treatment have been discussed with the patient and family. After consideration of risks, benefits and other options for treatment, the patient has consented to  Procedure(s): RIGHT HEART CATH (N/A) as a surgical intervention.  The patient's history has been reviewed, patient examined, no change in status, stable for surgery.  I have reviewed the patient's chart and labs.  Questions were answered to the patient's satisfaction.     Narayan Scull

## 2020-04-29 ENCOUNTER — Encounter (HOSPITAL_COMMUNITY): Payer: Self-pay | Admitting: Internal Medicine

## 2020-04-29 DIAGNOSIS — I350 Nonrheumatic aortic (valve) stenosis: Secondary | ICD-10-CM | POA: Diagnosis not present

## 2020-04-29 DIAGNOSIS — I482 Chronic atrial fibrillation, unspecified: Secondary | ICD-10-CM | POA: Diagnosis not present

## 2020-04-29 DIAGNOSIS — N1832 Chronic kidney disease, stage 3b: Secondary | ICD-10-CM | POA: Diagnosis not present

## 2020-04-29 DIAGNOSIS — I5043 Acute on chronic combined systolic (congestive) and diastolic (congestive) heart failure: Secondary | ICD-10-CM | POA: Diagnosis not present

## 2020-04-29 LAB — GLUCOSE, CAPILLARY
Glucose-Capillary: 122 mg/dL — ABNORMAL HIGH (ref 70–99)
Glucose-Capillary: 175 mg/dL — ABNORMAL HIGH (ref 70–99)
Glucose-Capillary: 161 mg/dL — ABNORMAL HIGH (ref 70–99)
Glucose-Capillary: 167 mg/dL — ABNORMAL HIGH (ref 70–99)

## 2020-04-29 LAB — TYPE AND SCREEN
ABO/RH(D): B POS
Antibody Screen: NEGATIVE

## 2020-04-29 LAB — CBC
HCT: 27.8 % — ABNORMAL LOW (ref 39.0–52.0)
Hemoglobin: 9 g/dL — ABNORMAL LOW (ref 13.0–17.0)
MCH: 30.8 pg (ref 26.0–34.0)
MCHC: 32.4 g/dL (ref 30.0–36.0)
MCV: 95.2 fL (ref 80.0–100.0)
Platelets: 103 10*3/uL — ABNORMAL LOW (ref 150–400)
RBC: 2.92 MIL/uL — ABNORMAL LOW (ref 4.22–5.81)
RDW: 18.3 % — ABNORMAL HIGH (ref 11.5–15.5)
WBC: 9.4 10*3/uL (ref 4.0–10.5)
nRBC: 0 % (ref 0.0–0.2)

## 2020-04-29 LAB — BASIC METABOLIC PANEL
Anion gap: 9 (ref 5–15)
BUN: 19 mg/dL (ref 8–23)
CO2: 24 mmol/L (ref 22–32)
Calcium: 7.9 mg/dL — ABNORMAL LOW (ref 8.9–10.3)
Chloride: 99 mmol/L (ref 98–111)
Creatinine, Ser: 1.93 mg/dL — ABNORMAL HIGH (ref 0.61–1.24)
GFR, Estimated: 36 mL/min — ABNORMAL LOW (ref >60)
Glucose, Bld: 120 mg/dL — ABNORMAL HIGH (ref 70–99)
Potassium: 4.5 mmol/L (ref 3.5–5.1)
Sodium: 132 mmol/L — ABNORMAL LOW (ref 135–145)

## 2020-04-29 LAB — HEPATIC FUNCTION PANEL
ALT: 15 U/L (ref 0–44)
AST: 33 U/L (ref 15–41)
Albumin: 2.3 g/dL — ABNORMAL LOW (ref 3.5–5.0)
Alkaline Phosphatase: 127 U/L — ABNORMAL HIGH (ref 38–126)
Bilirubin, Direct: 1.5 mg/dL — ABNORMAL HIGH (ref 0.0–0.2)
Indirect Bilirubin: 2.4 mg/dL — ABNORMAL HIGH (ref 0.3–0.9)
Total Bilirubin: 3.9 mg/dL — ABNORMAL HIGH (ref 0.3–1.2)
Total Protein: 5.5 g/dL — ABNORMAL LOW (ref 6.5–8.1)

## 2020-04-29 LAB — COOXEMETRY PANEL
Carboxyhemoglobin: 2.2 % — ABNORMAL HIGH (ref 0.5–1.5)
Methemoglobin: 0.8 % (ref 0.0–1.5)
O2 Saturation: 63.3 %
Total hemoglobin: 9.2 g/dL — ABNORMAL LOW (ref 12.0–16.0)

## 2020-04-29 LAB — APTT: aPTT: 84 seconds — ABNORMAL HIGH (ref 24–36)

## 2020-04-29 LAB — HEPARIN LEVEL (UNFRACTIONATED): Heparin Unfractionated: >2.2 IU/mL — ABNORMAL HIGH (ref 0.30–0.70)

## 2020-04-29 LAB — ABO/RH: ABO/RH(D): B POS

## 2020-04-29 MED ORDER — ENSURE ENLIVE PO LIQD
237.0000 mL | Freq: Two times a day (BID) | ORAL | Status: DC
  Administered 2020-04-29 – 2020-05-04 (×8): 237 mL via ORAL

## 2020-04-29 MED ORDER — IOHEXOL 9 MG/ML PO SOLN
ORAL | Status: AC
  Administered 2020-04-29: 500 mL
  Filled 2020-04-29: qty 500

## 2020-04-29 MED ORDER — FUROSEMIDE 10 MG/ML IJ SOLN
80.0000 mg | Freq: Two times a day (BID) | INTRAMUSCULAR | Status: DC
  Administered 2020-04-29 – 2020-04-30 (×3): 80 mg via INTRAVENOUS
  Filled 2020-04-29 (×3): qty 8

## 2020-04-29 NOTE — Progress Notes (Signed)
This RN noticed large bruising on patient's left lateral abdomen. Heparin paused. Paged MD. Obtained order for CBC. MD at bedside, stopping the heparin until the results come back per MD. Will continue to monitor the patient.

## 2020-04-29 NOTE — Progress Notes (Signed)
ANTICOAGULATION CONSULT NOTE - Follow Up Consult  Pharmacy Consult for heparin while apixaban on hold  Indication: atrial fibrillation  No Known Allergies  Patient Measurements: Weight: 106.8 kg (235 lb 7.2 oz) Heparin Dosing Weight: 100kg   Vital Signs: Temp: 97.9 F (36.6 C) (02/04 0339) Temp Source: Oral (02/04 0339) BP: 103/56 (02/04 0339) Pulse Rate: 72 (02/04 0339)  Labs: Recent Labs    04/27/20 0500 04/28/20 0034 04/28/20 1348 04/29/20 0402  HGB  --   --  10.5*  11.2*  --   HCT  --   --  31.0*  33.0*  --   APTT  --   --   --  84*  HEPARINUNFRC  --   --   --  >2.20*  CREATININE 1.56* 1.81*  --  1.93*    Estimated Creatinine Clearance: 42.4 mL/min (A) (by C-G formula based on SCr of 1.93 mg/dL (H)).   Medical History: Past Medical History:  Diagnosis Date  . Atrial flutter ([place]) 12/2010   Admitted with symptomatic bradycardia (HR 40s), atrial flutter with slow ventricular response 12/2010 + volume overload; AV nodal agents d/c'd and Pradaxa started; RFA in 01/2011  . CHF (congestive heart failure) ([place])   . Chronic combined systolic and diastolic heart failure ([place])    a. echo 01/06/11: mild LVH, EF 65-70%, mod to severe LAE, mild RVE, mild RAE, PASP 32;   TEE 10/12: EF 45-50% b. EF 45 to 50% by echo in 07/2018  . Class 2 severe obesity due to excess calories with serious comorbidity and body mass index (BMI) of 37.0 to 37.9 in adult ([place]) 07/17/2017  . Diabetes mellitus    non insulin dependant  . Hyperlipidemia   . Hypertension   . Osteoarthritis   . Presence of permanent cardiac pacemaker   . PSVT (paroxysmal supraventricular tachycardia) ([facility])    Possibly atrial flutter  . Severe aortic stenosis      Assessment: John [name] with Afib on apixaban.  EF 30% diuresed and volume status stable.  Plan for TAVR next week.  Will hold apixaban for upcoming procedure and bridge with heparin.  Last dose apixaban 2/3 am started heparin drip 1000 uts/hr last pm (12hr  after last apixaban dose) HL 2.2 elevated as expected from apixaban Aptt 84sec at goal - will continue to dose heparin via aptt unitl apixaban cleared and HL correlates with aptt.   No bleeding noted   Goal of Therapy:  Heparin level 0.3-0.7 units/ml  aptt 66-102 sec Monitor platelets by anticoagulation protocol: Yes   Plan:  Continue heparin drip 1000 uts/hr  Check heparin level and aptt and CBC daily   Bonnita Nasuti Pharm.D. CPP, BCPS Clinical Pharmacist [phone] 04/29/2020 7:19 AM

## 2020-04-29 NOTE — Progress Notes (Signed)
   CTSP due to large hematoma along left chest abdominal wall. Patient currently on heparin.   Hemodynamically stable.   CBC drawn.   Will get stat CT chest/ab to evaluate extent and see if any RP extension.   Glori Bickers, MD  9:06 PM

## 2020-04-29 NOTE — Progress Notes (Signed)
Dr. Haroldine Laws at bedside. Received order for Stat CT abdomen and chest.

## 2020-04-29 NOTE — Progress Notes (Addendum)
Advanced Heart Failure Rounding Note   Subjective:    RHC yesterday elevated filling pressures and reduced CI. CVP 15,     Still feeling good, bp with some mild improvement on dobutamine.  Coox improved to 63%   RA = 12 RV = 51/11 PA = 52/15 (29) PCW = 29 ( v = 44) Fick cardiac output/index = 4.5/2.0 Thermo CO/CI = 5.2/2.3 PVR = < 1.0 WU Ao sat = 110% PA sat = 64%, 66%  Assessment: 1. Markedly elevated left sided filling pressures with prominent v-waves in PCWP tracing 2. Moderate reduced cardiac index 3. SBPs in 90s with severe AS   Objective:   Weight Range:  Vital Signs:   Temp:  [97.8 F (36.6 C)-98 F (36.7 C)] 97.9 F (36.6 C) (02/04 0339) Pulse Rate:  [0-74] 72 (02/04 0339) Resp:  [0-21] 18 (02/04 0339) BP: (92-117)/(48-64) 103/56 (02/04 0339) SpO2:  [0 %-100 %] 98 % (02/04 0339) Weight:  [106.8 kg] 106.8 kg (02/04 0622)    Weight change: Filed Weights   04/27/20 0507 04/28/20 0445 04/29/20 0622  Weight: 105.8 kg 105.9 kg 106.8 kg    Intake/Output:   Intake/Output Summary (Last 24 hours) at 04/29/2020 0747 Last data filed at 04/29/2020 4540 Gross per 24 hour  Intake 457.1 ml  Output 650 ml  Net -192.9 ml     Physical Exam: Cardiac: JVD difficult to appreciate, normal rate and rhythm, diminished S2, 3/6 AS murmur, legs in unna boots Pulmonary: bibasilar rales, not in distress Abdominal: non distended abdomen, soft and nontender Psych: Alert, conversant, in good spirits   Telemetry: AF v paced Personally reviewed   Labs: Basic Metabolic Panel: Recent Labs  Lab 04/25/20 1157 04/26/20 0308 04/27/20 0500 04/28/20 0034 04/28/20 1348 04/29/20 0402  NA 138 136 137 135 139  137 132*  K 4.5 3.9 4.1 4.3 4.2  4.5 4.5  CL 101 100 102 99  --  99  CO2 26 26 25 25   --  24  GLUCOSE 179* 77 70 162*  --  120*  BUN 16 15 14 15   --  19  CREATININE 1.66* 1.44* 1.56* 1.81*  --  1.93*  CALCIUM 8.7* 8.7* 7.8* 8.0*  --  7.9*    Liver Function  Tests: No results for input(s): AST, ALT, ALKPHOS, BILITOT, PROT, ALBUMIN in the last 168 hours. No results for input(s): LIPASE, AMYLASE in the last 168 hours. No results for input(s): AMMONIA in the last 168 hours.  CBC: Recent Labs  Lab 04/25/20 1157 04/28/20 1348  WBC 6.8  --   HGB 14.2 10.5*  11.2*  HCT 42.9 31.0*  33.0*  MCV 94.5  --   PLT 145*  --     Cardiac Enzymes: No results for input(s): CKTOTAL, CKMB, CKMBINDEX, TROPONINI in the last 168 hours.  BNP: BNP (last 3 results) Recent Labs    03/09/20 1544 03/20/20 1253 04/25/20 1157  BNP 1,540.0* 2,072.0* 2,410.2*    ProBNP (last 3 results) No results for input(s): PROBNP in the last 8760 hours.    Other results:  Imaging: CARDIAC CATHETERIZATION  Result Date: 04/28/2020 Findings: RA = 12 RV = 51/11 PA = 52/15 (29) PCW = 29 ( v = 44) Fick cardiac output/index = 4.5/2.0 Thermo CO/CI = 5.2/2.3 PVR = < 1.0 WU Ao sat = 110% PA sat = 64%, 66% Assessment: 1. Markedly elevated left sided filling pressures with prominent v-waves in PCWP tracing 2. Moderate reduced cardiac index 3. SBPs  in 90s with severe AS Plan/Discussion: Will start low-dose dobutamine as tolerated to support BP and facilitate further diuresis prior to TAVR. Glori Bickers, MD 2:02 PM     Medications:     Scheduled Medications: . Chlorhexidine Gluconate Cloth  6 each Topical Daily  . dapagliflozin propanediol  10 mg Oral QAC breakfast  . furosemide  80 mg Intravenous Once  . insulin aspart  0-9 Units Subcutaneous TID WC  . insulin glargine  5 Units Subcutaneous QHS  . sodium chloride flush  10-40 mL Intracatheter Q12H  . sodium chloride flush  3 mL Intravenous Q12H  . sodium chloride flush  3 mL Intravenous Q12H    Infusions: . sodium chloride    . sodium chloride    . DOBUTamine 2.5 mcg/kg/min (04/28/20 1451)  . heparin 1,000 Units/hr (04/28/20 2140)    PRN Medications: sodium chloride, sodium chloride, acetaminophen, diclofenac  Sodium, ondansetron (ZOFRAN) IV, sodium chloride flush, sodium chloride flush, sodium chloride flush   Assessment/Plan:   1. Acute on Chronic Combined Systolic and Diastolic Heart Failure/NICM: - Echo 08/2015 EF 50-55% (PPM placed in 08/2015)  - Echo 10/2015 EF dropped to 40-45% - Echo 2018 EF 45% - Echo 2021 EF 30-35%, RV mildly reduced  - based on timing of drop in EF and 42% pacing percentage, concern for RV paced CM. Aortic valve also appears severely calcified and moderate-severely stenosed, suspect AS also contributing to CM. Had recent [facility] 9/21 that showed no coronary disease.  -Now on dobutamine support, will attempt to diurese further today lasix 80 BID -TAVR  scheduled for Tuesday 05/02/20  2. Mitral Regurgitation  - Moderate by TEE 8/21. -Severe by TTE 04/26/20 and consistent with severe functional MR  3. Aortic Stenosis  - severe low flow/low gradient aortic stenosis with mean gradient 15.5 mmHg, dimensionless index 0.23, AVA 0.86 cm2  -ECHO 2/1 with similar findings mean gradient 21, AVA 0.87 cm2 -TAVR scheduled for Tuesday 05/02/20    4. Atrial Flutter/ Chronic Atrial Fibrillation - s/p AFL ablation. -In chronic Afib, rate controlled. -Switched to heparin on 2/3 for TAVR procedure, holding eliquis - continue holding bb  5. H/o CHB  - s/p PPM followed by Dr. Lovena Le  - ? RV paced CM. He is pacing ~40%. - Will optimize patient's volume and discuss with EP BIV upgrade after TAVR  6. NASH cirrhosis -followed by Dr. Jenetta Downer, NAFLD socre 2.90 consistent with advanced fibrosis -Elastography ordered but not yet complete -no history of bleeding or known varices -check LFT's today -Monitor BP may need midodrine to support BP  7. Stage IIIb CKD -Followed by nephrology. -Baseline SCr ~1.5-1.8 - Creatinine up slightly from baseline but stable at 1.9  8. T2DM -On insulin.a1C 6.8 (12/21) -On farxiga - no further hypoglycemia on current basal ssi regimen,  will need to hold lantus night before procedure  Length of Stay: 4   Katherine Roan MD 04/29/2020, 7:47 AM  Advanced Heart Failure Team Pager [phone] (M-F; [place])  Please contact [facility] for night-coverage after hours (4p -7a ) and weekends on [email or web address]   Patient seen and examined with the above-signed Advanced Practice Provider and/or Housestaff. I personally reviewed laboratory data, imaging studies and relevant notes. I independently examined the patient and formulated the important aspects of the plan. I have edited the note to reflect any of my changes or salient points. I have personally discussed the plan with the patient and/or family.  Now on dobutamine. Co-ox improved to 63%.  CVP remains 11-12.  Back on IV lasix. Creatinine up slightly.   General:  Lying in bed No resp difficulty HEENT: normal Neck: supple. JVP to jaw Carotids 2+ bilat; + bruits. No lymphadenopathy or thryomegaly appreciated. Cor: PMI nondisplaced. Regular rate & rhythm.+ AS Lungs: clear Abdomen: soft, nontender, nondistended. No hepatosplenomegaly. No bruits or masses. Good bowel sounds. Extremities: no cyanosis, clubbing, rash, tr edema Neuro: alert & orientedx3, cranial nerves grossly intact. moves all 4 extremities w/o difficulty. Affect pleasant  Remains tenuous. Will continue to try and optimize with dobutamine and IV diuresis. Slated for TAVR on Tuesday. Will eventually need CRT upgrade due to persistent RV pacing. Watch renal function closely.   Glori Bickers, MD  8:50 PM

## 2020-04-29 NOTE — Progress Notes (Signed)
Complained of shortness of breath on room air, placed on o2 2l [place] . Pulse ox-100% claimed to feel better after and then started  crying  which he cannot  say the reason. Wife at bedside.

## 2020-04-29 NOTE — Progress Notes (Signed)
Initial Nutrition Assessment  DOCUMENTATION CODES:   Obesity unspecified  INTERVENTION:   - Ensure Enlive po BID, each supplement provides 350 kcal and 20 grams of protein  - Encourage adequate PO intake  NUTRITION DIAGNOSIS:   Moderate Malnutrition related to chronic illness (CHF, NASH cirrhosis) as evidenced by mild fat depletion,moderate fat depletion,mild muscle depletion,moderate muscle depletion.  GOAL:   Patient will meet greater than or equal to 90% of their needs  MONITOR:   PO intake,Supplement acceptance,Labs,Weight trends,I & O's  REASON FOR ASSESSMENT:   Consult Assessment of nutrition requirement/status  ASSESSMENT:   75 year old male who presented on 1/31 with worsening SOB and weight gain. PMH of CHF, T2DM, atrial fibrillation, CHB s/p PPM, NASH cirrhosis, CKD stage III, HTN, HLD. Admitted with acute on chronic combined systolic and diastolic heart failure.   2/03 - RHC  Noted plan for TAVR on 05/03/20.  Spoke with pt and wife at bedside. Pt reports that he typically has a good appetite and eats 2 good meals daily, breakfast and dinner. However, this has not been the case over the last couple of weeks. Pt states that when he got sick a couple weeks ago, he lost his appetite. He has been eating smaller meals and just eating what he can. For example, pt had oatmeal and juice this morning for breakfast.  Pt acknowledges weight fluctuations related to fluid status. He believes that his usual dry weight is around 217 lbs. Current weight is 235 lbs. Pt with mild to moderate pitting edema so difficult to determine dry weight or assess weight trends at this time.  Pt and wife amenable to trying Ensure Enlive supplements to increase kcal and protein intake in preparation for surgery next week. RD will monitor for ability to start some diet education after surgery.  Meal Completion: 15-100%  Medications reviewed and include: farxiga, IV lasix 80 mg BID, SSI, lantus 5  units daily, dobutamine gtt, heparin gtt  Labs reviewed: sodium 132, creatinine 1.93 CBG's: 122-201 x 24 hours  UOP: 650 ml x 24 hours I/O's: -4.2 L since admit  NUTRITION - FOCUSED PHYSICAL EXAM:  Flowsheet Row Most Recent Value  Orbital Region Moderate depletion  Upper Arm Region No depletion  Thoracic and Lumbar Region No depletion  Buccal Region Mild depletion  Temple Region No depletion  Clavicle Bone Region Moderate depletion  Clavicle and Acromion Bone Region Moderate depletion  Scapular Bone Region Moderate depletion  Dorsal Hand Moderate depletion  Patellar Region Mild depletion  Anterior Thigh Region Mild depletion  Posterior Calf Region Unable to assess  [lower legs wrapped]  Edema (RD Assessment) Moderate  [BLE]  Hair Reviewed  Eyes Reviewed  Mouth Reviewed  Skin Reviewed  Nails Reviewed       Diet Order:   Diet Order            Diet Heart Room service appropriate? Yes; Fluid consistency: Thin  Diet effective now                 EDUCATION NEEDS:   Education needs have been addressed  Skin:  Skin Assessment: Reviewed RN Assessment  Last BM:  no documented BM  Height:   Ht Readings from Last 1 Encounters:  04/25/20 6' (1.829 m)    Weight:   Wt Readings from Last 1 Encounters:  04/29/20 106.8 kg    BMI:  Body mass index is 31.93 kg/m.  Estimated Nutritional Needs:   Kcal:  2000-2200  Protein:  100-120 grams  Fluid:  1.8 L/day    Gustavus Bryant, MS, RD, LDN Inpatient Clinical Dietitian Please see AMiON for contact information.

## 2020-04-30 ENCOUNTER — Inpatient Hospital Stay (HOSPITAL_COMMUNITY): Payer: Medicare HMO

## 2020-04-30 DIAGNOSIS — I5023 Acute on chronic systolic (congestive) heart failure: Secondary | ICD-10-CM | POA: Diagnosis not present

## 2020-04-30 LAB — BASIC METABOLIC PANEL
Anion gap: 11 (ref 5–15)
BUN: 18 mg/dL (ref 8–23)
CO2: 22 mmol/L (ref 22–32)
Calcium: 7.5 mg/dL — ABNORMAL LOW (ref 8.9–10.3)
Chloride: 98 mmol/L (ref 98–111)
Creatinine, Ser: 1.68 mg/dL — ABNORMAL HIGH (ref 0.61–1.24)
GFR, Estimated: 42 mL/min — ABNORMAL LOW (ref >60)
Glucose, Bld: 122 mg/dL — ABNORMAL HIGH (ref 70–99)
Potassium: 3.7 mmol/L (ref 3.5–5.1)
Sodium: 131 mmol/L — ABNORMAL LOW (ref 135–145)

## 2020-04-30 LAB — GLUCOSE, CAPILLARY
Glucose-Capillary: 113 mg/dL — ABNORMAL HIGH (ref 70–99)
Glucose-Capillary: 279 mg/dL — ABNORMAL HIGH (ref 70–99)
Glucose-Capillary: 212 mg/dL — ABNORMAL HIGH (ref 70–99)
Glucose-Capillary: 230 mg/dL — ABNORMAL HIGH (ref 70–99)

## 2020-04-30 LAB — CBC
HCT: 26.5 % — ABNORMAL LOW (ref 39.0–52.0)
HCT: 28 % — ABNORMAL LOW (ref 39.0–52.0)
Hemoglobin: 9.2 g/dL — ABNORMAL LOW (ref 13.0–17.0)
Hemoglobin: 9 g/dL — ABNORMAL LOW (ref 13.0–17.0)
MCH: 32.3 pg (ref 26.0–34.0)
MCH: 30.6 pg (ref 26.0–34.0)
MCHC: 34.7 g/dL (ref 30.0–36.0)
MCHC: 32.1 g/dL (ref 30.0–36.0)
MCV: 93 fL (ref 80.0–100.0)
MCV: 95.2 fL (ref 80.0–100.0)
Platelets: 107 10*3/uL — ABNORMAL LOW (ref 150–400)
Platelets: 105 10*3/uL — ABNORMAL LOW (ref 150–400)
RBC: 2.85 MIL/uL — ABNORMAL LOW (ref 4.22–5.81)
RBC: 2.94 MIL/uL — ABNORMAL LOW (ref 4.22–5.81)
RDW: 18.2 % — ABNORMAL HIGH (ref 11.5–15.5)
RDW: 18.6 % — ABNORMAL HIGH (ref 11.5–15.5)
WBC: 8.6 10*3/uL (ref 4.0–10.5)
WBC: 9.5 10*3/uL (ref 4.0–10.5)
nRBC: 0.2 % (ref 0.0–0.2)
nRBC: 0 % (ref 0.0–0.2)

## 2020-04-30 LAB — COOXEMETRY PANEL
Carboxyhemoglobin: 2 % — ABNORMAL HIGH (ref 0.5–1.5)
Methemoglobin: 0.6 % (ref 0.0–1.5)
O2 Saturation: 57.4 %
Total hemoglobin: 8.5 g/dL — ABNORMAL LOW (ref 12.0–16.0)

## 2020-04-30 MED ORDER — HEPARIN (PORCINE) 25000 UT/250ML-% IV SOLN
600.0000 [IU]/h | INTRAVENOUS | Status: DC
  Administered 2020-04-30 – 2020-05-01 (×2): 1000 [IU]/h via INTRAVENOUS
  Administered 2020-05-02: 1400 [IU]/h via INTRAVENOUS
  Administered 2020-05-03 – 2020-05-04 (×3): 1500 [IU]/h via INTRAVENOUS
  Filled 2020-04-30 (×9): qty 250

## 2020-04-30 MED ORDER — FUROSEMIDE 10 MG/ML IJ SOLN
20.0000 mg/h | INTRAVENOUS | Status: DC
  Administered 2020-04-30: 13:00:00 12 mg/h via INTRAVENOUS
  Administered 2020-05-01: 20:00:00 15 mg/h via INTRAVENOUS
  Administered 2020-05-01: 12 mg/h via INTRAVENOUS
  Administered 2020-05-02 – 2020-05-03 (×3): 20 mg/h via INTRAVENOUS
  Filled 2020-04-30 (×8): qty 20

## 2020-04-30 NOTE — Plan of Care (Signed)
  Problem: Education: Goal: Knowledge of General Education information will improve Description: Including pain rating scale, medication(s)/side effects and non-pharmacologic comfort measures Outcome: Progressing   Problem: Clinical Measurements: Goal: Ability to maintain clinical measurements within normal limits will improve Outcome: Progressing   Problem: Clinical Measurements: Goal: Diagnostic test results will improve Outcome: Progressing   Problem: Activity: Goal: Risk for activity intolerance will decrease Outcome: Progressing   Problem: Nutrition: Goal: Adequate nutrition will be maintained Outcome: Progressing   Problem: Pain Managment: Goal: General experience of comfort will improve Outcome: Progressing   Problem: Skin Integrity: Goal: Risk for impaired skin integrity will decrease Outcome: Progressing

## 2020-04-30 NOTE — Progress Notes (Signed)
Advanced Heart Failure Rounding Note   Subjective:    CVP 19-20 this morning.  I/Os even.  Creatinine 1.93 => 1.68.  Co-ox 57% on dobutamine 2.5.   Currently off heparin with chest wall hematoma, hgb stable 9.2.   CT chest (2/4) with left mid to lower chest wall hematoma extending to upper abdomen.  No RP hematoma.  No underlying rib fracture.   RA = 12 RV = 51/11 PA = 52/15 (29) PCW = 29 ( v = 44) Fick cardiac output/index = 4.5/2.0 Thermo CO/CI = 5.2/2.3 PVR = < 1.0 WU Ao sat = 110% PA sat = 64%, 66%  Assessment: 1. Markedly elevated left sided filling pressures with prominent v-waves in PCWP tracing 2. Moderate reduced cardiac index 3. SBPs in 90s with severe AS   Objective:   Weight Range:  Vital Signs:   Temp:  [97.8 F (36.6 C)-98.1 F (36.7 C)] 97.8 F (36.6 C) (02/05 0722) Pulse Rate:  [62-72] 63 (02/05 0722) Resp:  [17-22] 19 (02/05 0722) BP: (98-101)/(52-74) 99/74 (02/05 0722) SpO2:  [97 %-100 %] 97 % (02/05 0722) Weight:  [106.1 kg] 106.1 kg (02/05 0631) Last BM Date: 04/30/20  Weight change: Filed Weights   04/28/20 0445 04/29/20 0622 04/30/20 0631  Weight: 105.9 kg 106.8 kg 106.1 kg    Intake/Output:   Intake/Output Summary (Last 24 hours) at 04/30/2020 1111 Last data filed at 04/30/2020 0800 Gross per 24 hour  Intake 1128 ml  Output 1050 ml  Net 78 ml     Physical Exam: General: NAD Neck: JVP 16 cm, no thyromegaly or thyroid nodule.  Lungs: Clear to auscultation bilaterally with normal respiratory effort. CV: Nondisplaced PMI.  Heart regular S1/S2, no S3/S4, 3/6 SEM RUSB with muffled S2.  1+ ankle edema.   Abdomen: Soft, nontender, no hepatosplenomegaly, no distention.  Skin: Intact without lesions or rashes.  Neurologic: Alert and oriented x 3.  Psych: Normal affect. Extremities: No clubbing or cyanosis.  HEENT: Normal.   Telemetry: AF, RV paced.  Personally reviewed   Labs: Basic Metabolic Panel: Recent Labs  Lab  04/26/20 0308 04/27/20 0500 04/28/20 0034 04/28/20 1348 04/29/20 0402 04/30/20 0400  NA 136 137 135 139  137 132* 131*  K 3.9 4.1 4.3 4.2  4.5 4.5 3.7  CL 100 102 99  --  99 98  CO2 26 25 25   --  24 22  GLUCOSE 77 70 162*  --  120* 122*  BUN 15 14 15   --  19 18  CREATININE 1.44* 1.56* 1.81*  --  1.93* 1.68*  CALCIUM 8.7* 7.8* 8.0*  --  7.9* 7.5*    Liver Function Tests: Recent Labs  Lab 04/29/20 0402  AST 33  ALT 15  ALKPHOS 127*  BILITOT 3.9*  PROT 5.5*  ALBUMIN 2.3*   No results for input(s): LIPASE, AMYLASE in the last 168 hours. No results for input(s): AMMONIA in the last 168 hours.  CBC: Recent Labs  Lab 04/25/20 1157 04/28/20 1348 04/29/20 2049 04/30/20 0400  WBC 6.8  --  9.4 8.6  HGB 14.2 10.5*  11.2* 9.0* 9.2*  HCT 42.9 31.0*  33.0* 27.8* 26.5*  MCV 94.5  --  95.2 93.0  PLT 145*  --  103* 107*    Cardiac Enzymes: No results for input(s): CKTOTAL, CKMB, CKMBINDEX, TROPONINI in the last 168 hours.  BNP: BNP (last 3 results) Recent Labs    03/09/20 1544 03/20/20 1253 04/25/20 1157  BNP 1,540.0* 2,072.0*  2,410.2*    ProBNP (last 3 results) No results for input(s): PROBNP in the last 8760 hours.    Other results:  Imaging: CARDIAC CATHETERIZATION  Result Date: 04/28/2020 Findings: RA = 12 RV = 51/11 PA = 52/15 (29) PCW = 29 ( v = 44) Fick cardiac output/index = 4.5/2.0 Thermo CO/CI = 5.2/2.3 PVR = < 1.0 WU Ao sat = 110% PA sat = 64%, 66% Assessment: 1. Markedly elevated left sided filling pressures with prominent v-waves in PCWP tracing 2. Moderate reduced cardiac index 3. SBPs in 90s with severe AS Plan/Discussion: Will start low-dose dobutamine as tolerated to support BP and facilitate further diuresis prior to TAVR. Glori Bickers, MD 2:02 PM   CT CHEST ABDOMEN PELVIS WO CONTRAST  Result Date: 04/30/2020 CLINICAL DATA:  Chest pain and shortness of breath. Anticoagulated. Left chest wall hematoma. EXAM: CT CHEST, ABDOMEN AND PELVIS  WITHOUT CONTRAST TECHNIQUE: Multidetector CT imaging of the chest, abdomen and pelvis was performed following the standard protocol without IV contrast. COMPARISON:  04/20/2020 FINDINGS: CT CHEST FINDINGS Cardiovascular: Cardiomegaly. Dual lead pacemaker in place. Calcification of the aortic valve region. Coronary artery calcification. Aortic atherosclerotic calcification. Mediastinum/Nodes: No mediastinal or hilar mass or lymphadenopathy. Lungs/Pleura: Bilateral pleural effusions layering dependently with dependent pulmonary atelectasis. Volume loss particularly in both lower lobes. Some hyperdense material could possibly relate to aspiration. Musculoskeletal: Hematoma of the left mid to lower posterolateral chest wall. No sign of underlying rib fracture. CT ABDOMEN PELVIS FINDINGS Hepatobiliary: Cirrhosis of the liver. Gallstones dependent in the gallbladder. No evidence of cholecystitis or obstruction by CT. Pancreas: Normal Spleen: Normal Adrenals/Urinary Tract: Adrenal glands are normal. Kidneys are normal. Bladder shows mild wall thickening. Stomach/Bowel: No acute bowel pathology. Diverticulosis without CT evidence of diverticulitis. Normal appendix. Cyst with some wall calcifications inferior to the fourth portion of the duodenum as seen chronically. Vascular/Lymphatic: Aortic atherosclerotic calcification. No aneurysm. No retroperitoneal bleeding. Reproductive: Enlarged prostate. Other: Bilateral inguinal hernias. Small amount of fluid in the left hernia. Left chest wall hemorrhage extends into the upper abdominal region. Musculoskeletal: Degenerative change of the spine and hips. IMPRESSION: 1. Hematoma of the left mid to lower posterolateral chest wall. No sign of underlying rib fracture. Left chest wall hemorrhage extends into the upper abdominal body wall region. 2. Bilateral pleural effusions layering dependently with dependent pulmonary atelectasis. Some hyperdense material in the lower lobes could  possibly relate to aspiration. 3. Cirrhosis of the liver. 4. Cholelithiasis without CT evidence of cholecystitis or obstruction. 5. Bilateral inguinal hernias. Small amount of fluid in the left hernia. 6. Benign appearing cyst with some wall calcifications inferior to the fourth portion of the duodenum as seen chronically. Aortic Atherosclerosis (ICD10-I70.0). Electronically Signed   By: Nelson Chimes M.D.   On: 04/30/2020 00:40     Medications:     Scheduled Medications: . Chlorhexidine Gluconate Cloth  6 each Topical Daily  . dapagliflozin propanediol  10 mg Oral QAC breakfast  . feeding supplement  237 mL Oral BID BM  . insulin aspart  0-9 Units Subcutaneous TID WC  . insulin glargine  5 Units Subcutaneous QHS  . sodium chloride flush  10-40 mL Intracatheter Q12H  . sodium chloride flush  3 mL Intravenous Q12H  . sodium chloride flush  3 mL Intravenous Q12H    Infusions: . sodium chloride    . sodium chloride    . DOBUTamine 2.5 mcg/kg/min (04/30/20 0330)  . furosemide (LASIX) 200 mg in dextrose 5% 100 mL (31m/mL) infusion    .  heparin Stopped (04/29/20 2000)    PRN Medications: sodium chloride, sodium chloride, acetaminophen, diclofenac Sodium, ondansetron (ZOFRAN) IV, sodium chloride flush, sodium chloride flush, sodium chloride flush   Assessment/Plan:   1. Acute on Chronic Combined Systolic and Diastolic Heart Failure/NICM: - Echo 08/2015 EF 50-55% (PPM placed in 08/2015)  - Echo 10/2015 EF dropped to 40-45% - Echo 2018 EF 45% - Echo 2021 EF 30-35%, RV mildly reduced  - based on timing of drop in EF and 42% pacing percentage, concern for RV paced CM. Aortic valve also appears severely calcified and moderate-severely stenosed, suspect AS also contributing to CM. Had recent [facility] 9/21 that showed no coronary disease.  - RHC with marginal CO and dobutamine started.  Continue dobutamine 2.5 with co-ox 57%.  - CVP 19-20 today, has had Lasix 80 mg IV x 1.  Will start Lasix gtt 12  mg/hr.  Follow creatinine and diuresis.  - TAVR  scheduled for Tuesday 05/02/20  2. Mitral Regurgitation  - Moderate by TEE 8/21. - Severe by TTE 04/26/20 and consistent with severe functional MR - Hopefully will improve with AVR.   3. Aortic Stenosis  - severe low flow/low gradient aortic stenosis with mean gradient 15.5 mmHg, dimensionless index 0.23, AVA 0.86 cm2  -ECHO 2/1 with similar findings mean gradient 21, AVA 0.87 cm2 -TAVR scheduled for Tuesday 05/02/20    4. Atrial Flutter/ Chronic Atrial Fibrillation - s/p AFL ablation. -In chronic Afib, rate controlled. -Switched to heparin on 2/3 for TAVR procedure, holding eliquis - continue holding bb - Heparin on hold for now with chest wall hematoma.  If heparin remains stable on repeat this afternoon, will restart heparin gtt carefully.   5. H/o CHB  - s/p PPM followed by Dr. Lovena Le  - ? RV paced CM. He is pacing ~40%. - Will optimize patient's volume and discuss with EP BIV upgrade after TAVR  6. NASH cirrhosis -followed by Dr. Jenetta Downer, NAFLD socre 2.90 consistent with advanced fibrosis -Elastography ordered but not yet complete -no history of bleeding or known varices  7. Stage IIIb CKD -Followed by nephrology. -Baseline SCr ~1.5-1.8 - Creatinine 1.9 => 1.68.  Follow with diuresis.   8. T2DM -On insulin.a1C 6.8 (12/21) -On farxiga - no further hypoglycemia on current basal ssi regimen, will need to hold lantus night before procedure  9. Chest wall hematoma - Uncertain etiology. No underlying rib fracture, no RP hematoma.  - Hgb stable at 9.2 today.  - Repeat CBC in the afternoon.  If remains stable, can restart heparin gtt.   Length of Stay: 5   Loralie Champagne MD 04/30/2020, 11:11 AM  Advanced Heart Failure Team Pager [phone] (M-F; 7a - 4p)  Please contact [facility] for night-coverage after hours (4p -7a ) and weekends on [email or web address]

## 2020-04-30 NOTE — Progress Notes (Signed)
Heparin still on hold per Dr. Sonia Side.

## 2020-04-30 NOTE — Progress Notes (Signed)
ANTICOAGULATION CONSULT NOTE - Follow Up Consult  Pharmacy Consult for heparin  Indication: atrial fibrillation  No Known Allergies  Patient Measurements: Weight: 106.1 kg (233 lb 14.5 oz) Heparin Dosing Weight: 100kg   Vital Signs: Temp: 98 F (36.7 C) (02/05 1546) Temp Source: Oral (02/05 1546) BP: 110/55 (02/05 1546) Pulse Rate: 67 (02/05 1546)  Labs: Recent Labs    04/28/20 0034 04/28/20 1348 04/29/20 0402 04/29/20 2049 04/30/20 0400 04/30/20 1537  HGB  --    < >  --  9.0* 9.2* 9.0*  HCT  --    < >  --  27.8* 26.5* 28.0*  PLT  --   --   --  103* 107* 105*  APTT  --   --  84*  --   --   --   HEPARINUNFRC  --   --  >2.20*  --   --   --   CREATININE 1.81*  --  1.93*  --  1.68*  --    < > = values in this interval not displayed.    Estimated Creatinine Clearance: 48.6 mL/min (A) (by C-G formula based on SCr of 1.68 mg/dL (H)).   Medical History: Past Medical History:  Diagnosis Date  . Atrial flutter ([place]) 12/2010   Admitted with symptomatic bradycardia (HR 40s), atrial flutter with slow ventricular response 12/2010 + volume overload; AV nodal agents d/c'd and Pradaxa started; RFA in 01/2011  . CHF (congestive heart failure) ([place])   . Chronic combined systolic and diastolic heart failure ([place])    a. echo 01/06/11: mild LVH, EF 65-70%, mod to severe LAE, mild RVE, mild RAE, PASP 32;   TEE 10/12: EF 45-50% b. EF 45 to 50% by echo in 07/2018  . Class 2 severe obesity due to excess calories with serious comorbidity and body mass index (BMI) of 37.0 to 37.9 in adult ([place]) 07/17/2017  . Diabetes mellitus    non insulin dependant  . Hyperlipidemia   . Hypertension   . Osteoarthritis   . Presence of permanent cardiac pacemaker   . PSVT (paroxysmal supraventricular tachycardia) (HCC)    Possibly atrial flutter  . Severe aortic stenosis      Assessment: 74yom with Afib on apixaban.  EF 30% diuresed and volume status stable.  Plan for TAVR next week.  Will hold  apixaban for upcoming procedure and bridge with heparin.  Last dose apixaban 2/3 am. Heparin stopped yesterday. Hgb stable at 9 this afternoon, will restart heparin.   Will continue to monitor and dose adjust based on aptt's until they correlate with heparin levels.    Goal of Therapy:  Heparin level 0.3-0.7 units/ml  aptt 66-102 sec Monitor platelets by anticoagulation protocol: Yes   Plan:  Restart heparin drip at 1000 uts/hr  Check heparin level and aptt and CBC daily  Erin Hearing PharmD., BCPS Clinical Pharmacist 04/30/2020 5:34 PM

## 2020-04-30 NOTE — Progress Notes (Signed)
    Follow-up from repeat CBC. Hgb overall stable. Was at 9.0 yesterday, at 9.2 this AM, now at 9.0. Will restart Heparin as previously recommended by Dr. Aundra Dubin. Recheck CBC in AM.   Signed, Erma Heritage, PA-C 04/30/2020, 4:34 PM Pager: [phone]

## 2020-05-01 DIAGNOSIS — I5023 Acute on chronic systolic (congestive) heart failure: Secondary | ICD-10-CM | POA: Diagnosis not present

## 2020-05-01 LAB — CBC
HCT: 26.9 % — ABNORMAL LOW (ref 39.0–52.0)
HCT: 30.2 % — ABNORMAL LOW (ref 39.0–52.0)
Hemoglobin: 9 g/dL — ABNORMAL LOW (ref 13.0–17.0)
Hemoglobin: 9.6 g/dL — ABNORMAL LOW (ref 13.0–17.0)
MCH: 31.5 pg (ref 26.0–34.0)
MCH: 30.4 pg (ref 26.0–34.0)
MCHC: 33.5 g/dL (ref 30.0–36.0)
MCHC: 31.8 g/dL (ref 30.0–36.0)
MCV: 94.1 fL (ref 80.0–100.0)
MCV: 95.6 fL (ref 80.0–100.0)
Platelets: 110 10*3/uL — ABNORMAL LOW (ref 150–400)
Platelets: 116 10*3/uL — ABNORMAL LOW (ref 150–400)
RBC: 2.86 MIL/uL — ABNORMAL LOW (ref 4.22–5.81)
RBC: 3.16 MIL/uL — ABNORMAL LOW (ref 4.22–5.81)
RDW: 18.4 % — ABNORMAL HIGH (ref 11.5–15.5)
RDW: 18.6 % — ABNORMAL HIGH (ref 11.5–15.5)
WBC: 8.4 10*3/uL (ref 4.0–10.5)
WBC: 9.5 10*3/uL (ref 4.0–10.5)
nRBC: 0 % (ref 0.0–0.2)
nRBC: 0 % (ref 0.0–0.2)

## 2020-05-01 LAB — BASIC METABOLIC PANEL
Anion gap: 11 (ref 5–15)
BUN: 20 mg/dL (ref 8–23)
CO2: 23 mmol/L (ref 22–32)
Calcium: 7.7 mg/dL — ABNORMAL LOW (ref 8.9–10.3)
Chloride: 99 mmol/L (ref 98–111)
Creatinine, Ser: 1.67 mg/dL — ABNORMAL HIGH (ref 0.61–1.24)
GFR, Estimated: 43 mL/min — ABNORMAL LOW (ref >60)
Glucose, Bld: 139 mg/dL — ABNORMAL HIGH (ref 70–99)
Potassium: 3.5 mmol/L (ref 3.5–5.1)
Sodium: 133 mmol/L — ABNORMAL LOW (ref 135–145)

## 2020-05-01 LAB — GLUCOSE, CAPILLARY
Glucose-Capillary: 124 mg/dL — ABNORMAL HIGH (ref 70–99)
Glucose-Capillary: 223 mg/dL — ABNORMAL HIGH (ref 70–99)
Glucose-Capillary: 256 mg/dL — ABNORMAL HIGH (ref 70–99)
Glucose-Capillary: 161 mg/dL — ABNORMAL HIGH (ref 70–99)

## 2020-05-01 LAB — COOXEMETRY PANEL
Carboxyhemoglobin: 2.3 % — ABNORMAL HIGH (ref 0.5–1.5)
Methemoglobin: 0.7 % (ref 0.0–1.5)
O2 Saturation: 59.5 %
Total hemoglobin: 10.1 g/dL — ABNORMAL LOW (ref 12.0–16.0)

## 2020-05-01 LAB — HEPARIN LEVEL (UNFRACTIONATED): Heparin Unfractionated: 0.39 IU/mL (ref 0.30–0.70)

## 2020-05-01 LAB — APTT: aPTT: 69 seconds — ABNORMAL HIGH (ref 24–36)

## 2020-05-01 LAB — URIC ACID: Uric Acid, Serum: 8.3 mg/dL (ref 3.7–8.6)

## 2020-05-01 MED ORDER — POTASSIUM CHLORIDE CRYS ER 20 MEQ PO TBCR
40.0000 meq | EXTENDED_RELEASE_TABLET | Freq: Once | ORAL | Status: AC
  Administered 2020-05-01: 40 meq via ORAL
  Filled 2020-05-01: qty 2

## 2020-05-01 MED ORDER — METOLAZONE 2.5 MG PO TABS
2.5000 mg | ORAL_TABLET | Freq: Once | ORAL | Status: AC
  Administered 2020-05-01: 2.5 mg via ORAL
  Filled 2020-05-01: qty 1

## 2020-05-01 MED ORDER — TRAMADOL HCL 50 MG PO TABS
50.0000 mg | ORAL_TABLET | Freq: Two times a day (BID) | ORAL | Status: DC | PRN
  Administered 2020-05-01 – 2020-05-07 (×6): 50 mg via ORAL
  Filled 2020-05-01 (×7): qty 1

## 2020-05-01 NOTE — Plan of Care (Signed)
  Problem: Education: Goal: Knowledge of General Education information will improve Description: Including pain rating scale, medication(s)/side effects and non-pharmacologic comfort measures Outcome: Progressing   Problem: Health Behavior/Discharge Planning: Goal: Ability to manage health-related needs will improve Outcome: Progressing   Problem: Clinical Measurements: Goal: Ability to maintain clinical measurements within normal limits will improve Outcome: Progressing   Problem: Clinical Measurements: Goal: Cardiovascular complication will be avoided Outcome: Progressing   Problem: Activity: Goal: Risk for activity intolerance will decrease Outcome: Progressing   Problem: Nutrition: Goal: Adequate nutrition will be maintained Outcome: Progressing   Problem: Pain Managment: Goal: General experience of comfort will improve Outcome: Progressing

## 2020-05-01 NOTE — Progress Notes (Signed)
ANTICOAGULATION CONSULT NOTE - Follow Up Consult  Pharmacy Consult for heparin  Indication: atrial fibrillation  No Known Allergies  Patient Measurements: Weight: 106.8 kg (235 lb 7.2 oz) Heparin Dosing Weight: 100kg   Vital Signs: Temp: 98.3 F (36.8 C) (02/06 0748) Temp Source: Oral (02/06 0748) BP: 108/59 (02/06 0748) Pulse Rate: 74 (02/06 0748)  Labs: Recent Labs    04/29/20 0402 04/29/20 2049 04/30/20 0400 04/30/20 1537 05/01/20 0340  HGB  --    < > 9.2* 9.0* 9.0*  HCT  --    < > 26.5* 28.0* 26.9*  PLT  --    < > 107* 105* 110*  APTT 84*  --   --   --  69*  HEPARINUNFRC >2.20*  --   --   --  0.39  CREATININE 1.93*  --  1.68*  --  1.67*   < > = values in this interval not displayed.    Estimated Creatinine Clearance: 49 mL/min (A) (by C-G formula based on SCr of 1.67 mg/dL (H)).   Medical History: Past Medical History:  Diagnosis Date  . Atrial flutter ([place]) 12/2010   Admitted with symptomatic bradycardia (HR 40s), atrial flutter with slow ventricular response 12/2010 + volume overload; AV nodal agents d/c'd and Pradaxa started; RFA in 01/2011  . CHF (congestive heart failure) ([place])   . Chronic combined systolic and diastolic heart failure ([place])    a. echo 01/06/11: mild LVH, EF 65-70%, mod to severe LAE, mild RVE, mild RAE, PASP 32;   TEE 10/12: EF 45-50% b. EF 45 to 50% by echo in 07/2018  . Class 2 severe obesity due to excess calories with serious comorbidity and body mass index (BMI) of 37.0 to 37.9 in adult ([place]) 07/17/2017  . Diabetes mellitus    non insulin dependant  . Hyperlipidemia   . Hypertension   . Osteoarthritis   . Presence of permanent cardiac pacemaker   . PSVT (paroxysmal supraventricular tachycardia) (HCC)    Possibly atrial flutter  . Severe aortic stenosis      Assessment: 74yom with Afib on apixaban.  EF 30% diuresed and volume status stable.  Plan for TAVR this week.  Will hold apixaban for upcoming procedure and bridge with  heparin. Last dose apixaban 2/3 am.  -Heparin level at goal and appears to be correlating with aPTT -Hg= 9.0  Goal of Therapy:  Heparin level 0.3-0.7 units/ml  aptt 66-102 sec Monitor platelets by anticoagulation protocol: Yes   Plan:  Continue heparin 1000 uts/hr  Check heparin level and aptt and CBC daily  Hildred Laser, PharmD Clinical Pharmacist **Pharmacist phone directory can now be found on [email or web address] ([email or web address]).  Listed under [place].

## 2020-05-01 NOTE — Progress Notes (Addendum)
Unna boots removed from BLLE. Pt verbalized relief. In Left medical aspect of lower leg, healing wound is cared with foam dressing.  Palma Holter, RN

## 2020-05-01 NOTE — Progress Notes (Signed)
Patient ID: John [name], male   DOB: November 08, 1945, 75 y.o.   MRN: [id number]    Advanced Heart Failure Rounding Note   Subjective:    CVP 13-14 this morning.  I/Os even with weight up about a lb.  Creatinine 1.93 => 1.68 => 1.67.  Remains on dobutamine 2.5, no co-ox today.   Heparin restarted yesterday with stable creatinine.  Chest wall hematoma does not appear progressive.   CT chest (2/4) with left mid to lower chest wall hematoma extending to upper abdomen.  No RP hematoma.  No underlying rib fracture.   RA = 12 RV = 51/11 PA = 52/15 (29) PCW = 29 ( v = 44) Fick cardiac output/index = 4.5/2.0 Thermo CO/CI = 5.2/2.3 PVR = < 1.0 WU Ao sat = 110% PA sat = 64%, 66%  Assessment: 1. Markedly elevated left sided filling pressures with prominent v-waves in PCWP tracing 2. Moderate reduced cardiac index 3. SBPs in 90s with severe AS   Objective:   Weight Range:  Vital Signs:   Temp:  [97.5 F (36.4 C)-98.3 F (36.8 C)] 98.3 F (36.8 C) (02/06 0748) Pulse Rate:  [62-74] 74 (02/06 0748) Resp:  [19-23] 19 (02/06 0748) BP: (104-116)/(55-64) 108/59 (02/06 0748) SpO2:  [95 %-99 %] 99 % (02/06 0748) Weight:  [106.8 kg] 106.8 kg (02/06 0639) Last BM Date: 04/30/20  Weight change: Filed Weights   04/29/20 0622 04/30/20 0631 05/01/20 0639  Weight: 106.8 kg 106.1 kg 106.8 kg    Intake/Output:   Intake/Output Summary (Last 24 hours) at 05/01/2020 1000 Last data filed at 05/01/2020 0749 Gross per 24 hour  Intake 1237.68 ml  Output 1800 ml  Net -562.32 ml     Physical Exam: General: NAD Neck: JVP 14 cm, no thyromegaly or thyroid nodule.  Lungs: Clear to auscultation bilaterally with normal respiratory effort. CV: Nondisplaced PMI.  Heart irregular S1/S2, no S3/S4, 3/6 SEM RUSB with muffled S2.  1+ ankle edema.  Abdomen: Soft, nontender, no hepatosplenomegaly, no distention.  Skin: Stable left chest wall hematoma.   Neurologic: Alert and oriented x 3.  Psych: Normal  affect. Extremities: No clubbing or cyanosis.  HEENT: Normal.   Telemetry: AF, RV paced.  Personally reviewed   Labs: Basic Metabolic Panel: Recent Labs  Lab 04/27/20 0500 04/28/20 0034 04/28/20 1348 04/29/20 0402 04/30/20 0400 05/01/20 0340  NA 137 135 139  137 132* 131* 133*  K 4.1 4.3 4.2  4.5 4.5 3.7 3.5  CL 102 99  --  99 98 99  CO2 25 25  --  24 22 23   GLUCOSE 70 162*  --  120* 122* 139*  BUN 14 15  --  19 18 20   CREATININE 1.56* 1.81*  --  1.93* 1.68* 1.67*  CALCIUM 7.8* 8.0*  --  7.9* 7.5* 7.7*    Liver Function Tests: Recent Labs  Lab 04/29/20 0402  AST 33  ALT 15  ALKPHOS 127*  BILITOT 3.9*  PROT 5.5*  ALBUMIN 2.3*   No results for input(s): LIPASE, AMYLASE in the last 168 hours. No results for input(s): AMMONIA in the last 168 hours.  CBC: Recent Labs  Lab 04/25/20 1157 04/28/20 1348 04/29/20 2049 04/30/20 0400 04/30/20 1537 05/01/20 0340  WBC 6.8  --  9.4 8.6 9.5 8.4  HGB 14.2 10.5*  11.2* 9.0* 9.2* 9.0* 9.0*  HCT 42.9 31.0*  33.0* 27.8* 26.5* 28.0* 26.9*  MCV 94.5  --  95.2 93.0 95.2 94.1  PLT 145*  --  103* 107* 105* 110*    Cardiac Enzymes: No results for input(s): CKTOTAL, CKMB, CKMBINDEX, TROPONINI in the last 168 hours.  BNP: BNP (last 3 results) Recent Labs    03/09/20 1544 03/20/20 1253 04/25/20 1157  BNP 1,540.0* 2,072.0* 2,410.2*    ProBNP (last 3 results) No results for input(s): PROBNP in the last 8760 hours.    Other results:  Imaging: CT CHEST ABDOMEN PELVIS WO CONTRAST  Result Date: 04/30/2020 CLINICAL DATA:  Chest pain and shortness of breath. Anticoagulated. Left chest wall hematoma. EXAM: CT CHEST, ABDOMEN AND PELVIS WITHOUT CONTRAST TECHNIQUE: Multidetector CT imaging of the chest, abdomen and pelvis was performed following the standard protocol without IV contrast. COMPARISON:  04/20/2020 FINDINGS: CT CHEST FINDINGS Cardiovascular: Cardiomegaly. Dual lead pacemaker in place. Calcification of the aortic  valve region. Coronary artery calcification. Aortic atherosclerotic calcification. Mediastinum/Nodes: No mediastinal or hilar mass or lymphadenopathy. Lungs/Pleura: Bilateral pleural effusions layering dependently with dependent pulmonary atelectasis. Volume loss particularly in both lower lobes. Some hyperdense material could possibly relate to aspiration. Musculoskeletal: Hematoma of the left mid to lower posterolateral chest wall. No sign of underlying rib fracture. CT ABDOMEN PELVIS FINDINGS Hepatobiliary: Cirrhosis of the liver. Gallstones dependent in the gallbladder. No evidence of cholecystitis or obstruction by CT. Pancreas: Normal Spleen: Normal Adrenals/Urinary Tract: Adrenal glands are normal. Kidneys are normal. Bladder shows mild wall thickening. Stomach/Bowel: No acute bowel pathology. Diverticulosis without CT evidence of diverticulitis. Normal appendix. Cyst with some wall calcifications inferior to the fourth portion of the duodenum as seen chronically. Vascular/Lymphatic: Aortic atherosclerotic calcification. No aneurysm. No retroperitoneal bleeding. Reproductive: Enlarged prostate. Other: Bilateral inguinal hernias. Small amount of fluid in the left hernia. Left chest wall hemorrhage extends into the upper abdominal region. Musculoskeletal: Degenerative change of the spine and hips. IMPRESSION: 1. Hematoma of the left mid to lower posterolateral chest wall. No sign of underlying rib fracture. Left chest wall hemorrhage extends into the upper abdominal body wall region. 2. Bilateral pleural effusions layering dependently with dependent pulmonary atelectasis. Some hyperdense material in the lower lobes could possibly relate to aspiration. 3. Cirrhosis of the liver. 4. Cholelithiasis without CT evidence of cholecystitis or obstruction. 5. Bilateral inguinal hernias. Small amount of fluid in the left hernia. 6. Benign appearing cyst with some wall calcifications inferior to the fourth portion of the  duodenum as seen chronically. Aortic Atherosclerosis (ICD10-I70.0). Electronically Signed   By: Nelson Chimes M.D.   On: 04/30/2020 00:40     Medications:     Scheduled Medications: . Chlorhexidine Gluconate Cloth  6 each Topical Daily  . dapagliflozin propanediol  10 mg Oral QAC breakfast  . feeding supplement  237 mL Oral BID BM  . insulin aspart  0-9 Units Subcutaneous TID WC  . insulin glargine  5 Units Subcutaneous QHS  . metolazone  2.5 mg Oral Once  . potassium chloride  40 mEq Oral Once  . sodium chloride flush  10-40 mL Intracatheter Q12H  . sodium chloride flush  3 mL Intravenous Q12H  . sodium chloride flush  3 mL Intravenous Q12H    Infusions: . sodium chloride    . sodium chloride    . DOBUTamine 2.5 mcg/kg/min (05/01/20 0540)  . furosemide (LASIX) 200 mg in dextrose 5% 100 mL (51m/mL) infusion 12 mg/hr (05/01/20 0341)  . heparin 1,000 Units/hr (04/30/20 1806)    PRN Medications: sodium chloride, sodium chloride, acetaminophen, diclofenac Sodium, ondansetron (ZOFRAN) IV, sodium chloride flush, sodium chloride flush, sodium chloride flush, traMADol  Assessment/Plan:   1. 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 Aortic valve also appears severely calcified and moderate-severely stenosed, suspect AS also contributing to CM. Had recent [facility] 9/21 that showed no coronary disease.  - RHC with marginal CO and dobutamine started.  Continue dobutamine 2.5, send co-ox today.  - CVP remains 13-14, I/Os even.  Will increase Lasix gtt to 15 mg/hr and give a dose of metolazone 2.5.  Creatinine stable at 1.67.   - TAVR scheduled for Tuesday 05/03/20  2. Mitral Regurgitation  - Moderate by TEE 8/21. - Severe by  TTE 04/26/20 and consistent with severe functional MR - Hopefully will improve with AVR.   3. Aortic Stenosis  - severe low flow/low gradient aortic stenosis with mean gradient 15.5 mmHg, dimensionless index 0.23, AVA 0.86 cm2  -ECHO 2/1 with similar findings mean gradient 21, AVA 0.87 cm2 -TAVR scheduled for Tuesday 05/03/20    4. Atrial Flutter/ Chronic Atrial Fibrillation - s/p AFL ablation. -In chronic Afib, rate controlled. -Switched to heparin on 2/3 for TAVR procedure, holding eliquis - continue holding bb - Heparin gtt restarted yesterday, stable hgb at 9.   5. H/o CHB  - s/p PPM followed by Dr. Lovena Le  - ? RV paced CM. He is pacing ~40%. - Will optimize patient's volume and discuss with EP BIV upgrade after TAVR  6. NASH cirrhosis -followed by Dr. Jenetta Downer, NAFLD socre 2.90 consistent with advanced fibrosis -Elastography ordered but not yet complete -no history of bleeding or known varices  7. Stage IIIb CKD -Followed by nephrology. -Baseline SCr ~1.5-1.8 - Creatinine 1.9 => 1.68 => 1.67.  Follow with diuresis.   8. T2DM -On insulin.Hgba1C 6.8 (12/21) -On farxiga - no further hypoglycemia on current basal ssi regimen, will need to hold lantus night before procedure  9. Chest wall hematoma - Uncertain etiology. No underlying rib fracture, no RP hematoma.  - Hgb stable at 9 today.  - Stable appearing, he has restarted heparin gtt.    Length of Stay: 6   Loralie Champagne MD 05/01/2020, 10:00 AM  Advanced Heart Failure Team Pager [phone] (M-F; 7a - 4p)  Please contact [facility] for night-coverage after hours (4p -7a ) and weekends on [email or web address]

## 2020-05-01 NOTE — Progress Notes (Signed)
Pt called RN to room and said not feeling well, and feeling hot. Temp Afebrile, BP 120/57, 98% RA.   Informed to PA Strader, Stat CBC sent, pt said he is feeling better after he took gown off , PA is coming to assess him later, wife is in bed side, Will continue to monitor  Palma Holter, RN

## 2020-05-01 NOTE — Progress Notes (Signed)
    Went to assess patient. He reports feeling like he has a "hot flash" and denies any associated chest pain, palpitations or dyspnea. Afebrile by recent vitals. Given recent restart of Heparin in the setting of recent chest wall hematoma, will recheck CBC.   Signed, Erma Heritage, PA-C 05/01/2020, 4:50 PM Pager: [phone]

## 2020-05-01 NOTE — Progress Notes (Signed)
Physical Therapy Treatment Patient Details Name: John [name] MRN: [id number] DOB: [birth year] Today's Date: 05/01/2020    History of Present Illness Pt admit with chronic heart failure and Afib. Pt work up for TAVR.    PT Comments    Pt in recliner on arrival with c/o new onset L knee pain. Min guard assist transfer and ambulation 10' with RW. Mobility/gait distance limited by L knee pain. Pain worsens with ROM, weight bearing, and palpation. Of note, pt developed large L chest hematoma 2/4. Pt reports this new knee pain came on as suddenly as the chest hematoma.  Per pt, no known injury and no h/o gout or arthritis pain. Unna boots in place BLE. Pt remained in recliner at end of session. PT to continue to follow.   Follow Up Recommendations  Home health PT;Supervision/Assistance - 24 hour     Equipment Recommendations  None recommended by PT    Recommendations for Other Services       Precautions / Restrictions Precautions Precautions: Fall Restrictions Weight Bearing Restrictions: No Other Position/Activity Restrictions: new onset L knee pain    Mobility  Bed Mobility               General bed mobility comments: in chair  Transfers Overall transfer level: Needs assistance Equipment used: Rolling walker (2 wheeled) Transfers: Sit to/from Stand Sit to Stand: Min guard         General transfer comment: increased time  Ambulation/Gait Ambulation/Gait assistance: Min guard Gait Distance (Feet): 10 Feet Assistive device: Rolling walker (2 wheeled) Gait Pattern/deviations: Antalgic;Step-to pattern;Decreased stride length;Trunk flexed Gait velocity: decreased   General Gait Details: distance limited by L knee pain   Stairs             Wheelchair Mobility    Modified Rankin (Stroke Patients Only)       Balance Overall balance assessment: Needs assistance Sitting-balance support: No upper extremity supported;Feet supported Sitting  balance-Leahy Scale: Fair     Standing balance support: Bilateral upper extremity supported;During functional activity Standing balance-Leahy Scale: Poor Standing balance comment: reliant on BUE support                            Cognition Arousal/Alertness: Awake/alert Behavior During Therapy: WFL for tasks assessed/performed Overall Cognitive Status: Within Functional Limits for tasks assessed                                        Exercises      General Comments General comments (skin integrity, edema, etc.): unna boots in place BLE, 2-3+ pitting edema bilat knees (L>R), tender to palpation lateral/medial aspect L knee, increased pain elicited with patellar mobilization L knee      Pertinent Vitals/Pain Pain Assessment: Faces Faces Pain Scale: Hurts even more Pain Location: L knee Pain Descriptors / Indicators: Grimacing;Guarding;Discomfort;Tender Pain Intervention(s): Limited activity within patient's tolerance;Monitored during session    Home Living                      Prior Function            PT Goals (current goals can now be found in the care plan section) Acute Rehab PT Goals Patient Stated Goal: home Progress towards PT goals: Not progressing toward goals - comment (new onset L knee pain)    Frequency  Min 3X/week      PT Plan Current plan remains appropriate    Co-evaluation              AM-PAC PT "6 Clicks" Mobility   Outcome Measure  Help needed turning from your back to your side while in a flat bed without using bedrails?: None Help needed moving from lying on your back to sitting on the side of a flat bed without using bedrails?: None Help needed moving to and from a bed to a chair (including a wheelchair)?: A Little Help needed standing up from a chair using your arms (e.g., wheelchair or bedside chair)?: A Little Help needed to walk in hospital room?: A Little Help needed climbing 3-5 steps  with a railing? : A Lot 6 Click Score: 19    End of Session Equipment Utilized During Treatment: Gait belt Activity Tolerance: Patient limited by pain Patient left: in chair;with call bell/phone within reach;with family/visitor present Nurse Communication: Mobility status PT Visit Diagnosis: Muscle weakness (generalized) (M62.81);Pain Pain - Right/Left: Left Pain - part of body: Knee     Time: 1517-6160 PT Time Calculation (min) (ACUTE ONLY): 11 min  Charges:  $Gait Training: 8-22 mins                     Lorrin Goodell, PT  Office # [phone] Pager [phone]    Lorriane Shire 05/01/2020, 9:21 AM

## 2020-05-02 ENCOUNTER — Encounter (HOSPITAL_COMMUNITY): Payer: Medicare HMO | Admitting: Internal Medicine

## 2020-05-02 DIAGNOSIS — I350 Nonrheumatic aortic (valve) stenosis: Secondary | ICD-10-CM | POA: Diagnosis not present

## 2020-05-02 DIAGNOSIS — N1832 Chronic kidney disease, stage 3b: Secondary | ICD-10-CM | POA: Diagnosis not present

## 2020-05-02 DIAGNOSIS — I5043 Acute on chronic combined systolic (congestive) and diastolic (congestive) heart failure: Secondary | ICD-10-CM | POA: Diagnosis not present

## 2020-05-02 DIAGNOSIS — I482 Chronic atrial fibrillation, unspecified: Secondary | ICD-10-CM | POA: Diagnosis not present

## 2020-05-02 LAB — HEPARIN LEVEL (UNFRACTIONATED)
Heparin Unfractionated: 0.18 IU/mL — ABNORMAL LOW (ref 0.30–0.70)
Heparin Unfractionated: 0.18 IU/mL — ABNORMAL LOW (ref 0.30–0.70)
Heparin Unfractionated: 0.22 IU/mL — ABNORMAL LOW (ref 0.30–0.70)

## 2020-05-02 LAB — URINALYSIS, ROUTINE W REFLEX MICROSCOPIC
Bilirubin Urine: NEGATIVE
Glucose, UA: >=500 mg/dL — AB
Hgb urine dipstick: NEGATIVE
Ketones, ur: NEGATIVE mg/dL
Leukocytes,Ua: NEGATIVE
Nitrite: NEGATIVE
Protein, ur: NEGATIVE mg/dL
Specific Gravity, Urine: 1.005 (ref 1.005–1.030)
pH: 7 (ref 5.0–8.0)

## 2020-05-02 LAB — GLUCOSE, CAPILLARY
Glucose-Capillary: 130 mg/dL — ABNORMAL HIGH (ref 70–99)
Glucose-Capillary: 205 mg/dL — ABNORMAL HIGH (ref 70–99)
Glucose-Capillary: 166 mg/dL — ABNORMAL HIGH (ref 70–99)
Glucose-Capillary: 121 mg/dL — ABNORMAL HIGH (ref 70–99)

## 2020-05-02 LAB — COOXEMETRY PANEL
Carboxyhemoglobin: 2.3 % — ABNORMAL HIGH (ref 0.5–1.5)
Methemoglobin: 0.9 % (ref 0.0–1.5)
O2 Saturation: 53.3 %
Total hemoglobin: 10 g/dL — ABNORMAL LOW (ref 12.0–16.0)

## 2020-05-02 LAB — CBC
HCT: 31.2 % — ABNORMAL LOW (ref 39.0–52.0)
Hemoglobin: 9.8 g/dL — ABNORMAL LOW (ref 13.0–17.0)
MCH: 30.2 pg (ref 26.0–34.0)
MCHC: 31.4 g/dL (ref 30.0–36.0)
MCV: 96 fL (ref 80.0–100.0)
Platelets: 128 10*3/uL — ABNORMAL LOW (ref 150–400)
RBC: 3.25 MIL/uL — ABNORMAL LOW (ref 4.22–5.81)
RDW: 18.5 % — ABNORMAL HIGH (ref 11.5–15.5)
WBC: 9.3 10*3/uL (ref 4.0–10.5)
nRBC: 0 % (ref 0.0–0.2)

## 2020-05-02 LAB — BASIC METABOLIC PANEL
Anion gap: 12 (ref 5–15)
BUN: 20 mg/dL (ref 8–23)
CO2: 23 mmol/L (ref 22–32)
Calcium: 7.8 mg/dL — ABNORMAL LOW (ref 8.9–10.3)
Chloride: 95 mmol/L — ABNORMAL LOW (ref 98–111)
Creatinine, Ser: 1.48 mg/dL — ABNORMAL HIGH (ref 0.61–1.24)
GFR, Estimated: 49 mL/min — ABNORMAL LOW (ref >60)
Glucose, Bld: 240 mg/dL — ABNORMAL HIGH (ref 70–99)
Potassium: 3.3 mmol/L — ABNORMAL LOW (ref 3.5–5.1)
Sodium: 130 mmol/L — ABNORMAL LOW (ref 135–145)

## 2020-05-02 LAB — PROTIME-INR
INR: 1.4 — ABNORMAL HIGH (ref 0.8–1.2)
Prothrombin Time: 16.2 seconds — ABNORMAL HIGH (ref 11.4–15.2)

## 2020-05-02 LAB — SURGICAL PCR SCREEN
MRSA, PCR: NEGATIVE
Staphylococcus aureus: NEGATIVE

## 2020-05-02 LAB — TYPE AND SCREEN
ABO/RH(D): B POS
Antibody Screen: NEGATIVE

## 2020-05-02 LAB — MAGNESIUM: Magnesium: 2.6 mg/dL — ABNORMAL HIGH (ref 1.7–2.4)

## 2020-05-02 LAB — APTT: aPTT: 65 seconds — ABNORMAL HIGH (ref 24–36)

## 2020-05-02 MED ORDER — CHLORHEXIDINE GLUCONATE 0.12 % MT SOLN
15.0000 mL | Freq: Once | OROMUCOSAL | Status: AC
  Administered 2020-05-03: 15 mL via OROMUCOSAL
  Filled 2020-05-02: qty 15

## 2020-05-02 MED ORDER — CHLORHEXIDINE GLUCONATE 4 % EX LIQD
1.0000 "application " | Freq: Once | CUTANEOUS | Status: AC
  Administered 2020-05-03: 1 via TOPICAL
  Filled 2020-05-02: qty 60

## 2020-05-02 MED ORDER — INSULIN ASPART 100 UNIT/ML ~~LOC~~ SOLN
2.0000 [IU] | Freq: Three times a day (TID) | SUBCUTANEOUS | Status: DC
  Administered 2020-05-02 – 2020-05-09 (×15): 2 [IU] via SUBCUTANEOUS

## 2020-05-02 MED ORDER — BISACODYL 5 MG PO TBEC
5.0000 mg | DELAYED_RELEASE_TABLET | Freq: Once | ORAL | Status: AC
  Administered 2020-05-02: 5 mg via ORAL
  Filled 2020-05-02: qty 1

## 2020-05-02 MED ORDER — SODIUM CHLORIDE 0.9 % IV SOLN
INTRAVENOUS | Status: DC
  Filled 2020-05-02: qty 30

## 2020-05-02 MED ORDER — MAGNESIUM SULFATE 50 % IJ SOLN
40.0000 meq | INTRAMUSCULAR | Status: DC
  Filled 2020-05-02: qty 9.85

## 2020-05-02 MED ORDER — TEMAZEPAM 15 MG PO CAPS
15.0000 mg | ORAL_CAPSULE | Freq: Once | ORAL | Status: DC | PRN
Start: 2020-05-02 — End: 2020-05-03

## 2020-05-02 MED ORDER — NOREPINEPHRINE 4 MG/250ML-% IV SOLN
0.0000 ug/min | INTRAVENOUS | Status: DC
  Filled 2020-05-02: qty 250

## 2020-05-02 MED ORDER — SODIUM CHLORIDE 0.9 % IV SOLN
1.5000 g | INTRAVENOUS | Status: AC
  Administered 2020-05-03: 1.5 g via INTRAVENOUS
  Filled 2020-05-02 (×2): qty 1.5

## 2020-05-02 MED ORDER — DEXMEDETOMIDINE HCL IN NACL 400 MCG/100ML IV SOLN
0.1000 ug/kg/h | INTRAVENOUS | Status: AC
  Administered 2020-05-03: .7 ug/kg/h via INTRAVENOUS
  Filled 2020-05-02: qty 100

## 2020-05-02 MED ORDER — POTASSIUM CHLORIDE 2 MEQ/ML IV SOLN
80.0000 meq | INTRAVENOUS | Status: DC
  Filled 2020-05-02: qty 40

## 2020-05-02 MED ORDER — VANCOMYCIN HCL 1500 MG/300ML IV SOLN
1500.0000 mg | INTRAVENOUS | Status: AC
  Administered 2020-05-03: 1500 mg via INTRAVENOUS
  Filled 2020-05-02: qty 300

## 2020-05-02 MED ORDER — POTASSIUM CHLORIDE CRYS ER 20 MEQ PO TBCR
40.0000 meq | EXTENDED_RELEASE_TABLET | Freq: Two times a day (BID) | ORAL | Status: AC
  Administered 2020-05-02 (×2): 40 meq via ORAL
  Filled 2020-05-02 (×2): qty 2

## 2020-05-02 NOTE — Progress Notes (Signed)
ANTICOAGULATION CONSULT NOTE - Follow Up Consult  Pharmacy Consult for heparin  Indication: atrial fibrillation  No Known Allergies  Patient Measurements: Weight: 104.6 kg (230 lb 9.6 oz) Heparin Dosing Weight: 100kg   Vital Signs: Temp: 97.9 F (36.6 C) (02/07 0747) Temp Source: Oral (02/07 0747) BP: 110/64 (02/07 0747) Pulse Rate: 69 (02/07 0747)  Labs: Recent Labs    04/30/20 0400 04/30/20 1537 05/01/20 0340 05/01/20 1620 05/02/20 0325 05/02/20 1007  HGB 9.2*   < > 9.0* 9.6* 9.8*  --   HCT 26.5*   < > 26.9* 30.2* 31.2*  --   PLT 107*   < > 110* 116* 128*  --   APTT  --   --  69*  --  65*  --   HEPARINUNFRC  --   --  0.39  --  0.18* 0.18*  CREATININE 1.68*  --  1.67*  --  1.48*  --    < > = values in this interval not displayed.    Estimated Creatinine Clearance: 54.8 mL/min (A) (by C-G formula based on SCr of 1.48 mg/dL (H)).   Assessment: 74yom with Afib on apixaban.  EF 30% diuresed and volume status stable.  Plan for TAVR this week.  Will hold apixaban for upcoming procedure and bridge with heparin. Last dose apixaban 2/3 am.   Heparin level this morning came back subtherapeutic at 0.18 despite rate increase to 1200 units/hr. Hgb 9.8, plt 128. No s/sx of bleeding or infusion issues per RN.   Goal of Therapy:  Heparin level 0.3-0.7 units/ml  Monitor platelets by anticoagulation protocol: Yes   Plan:  Increase heparin to 1400 uts/hr  Will f/u 6-8 hr heparin level Monitor daily HL, CBC, and for s/sx of bleeding   Antonietta Jewel, PharmD, [place] Pharmacist  Phone: [phone] 05/02/2020 11:37 AM  Please check AMION for all [place] phone numbers After 10:00 PM, call [place] [phone]

## 2020-05-02 NOTE — Progress Notes (Signed)
ANTICOAGULATION CONSULT NOTE - Follow Up Consult  Pharmacy Consult for heparin  Indication: atrial fibrillation  No Known Allergies  Patient Measurements: Weight: 106.8 kg (235 lb 7.2 oz) Heparin Dosing Weight: 100kg   Vital Signs: Temp: 98.3 F (36.8 C) (02/07 0315) Temp Source: Oral (02/07 0315) BP: 107/76 (02/07 0315) Pulse Rate: 64 (02/07 0315)  Labs: Recent Labs    04/30/20 0400 04/30/20 1537 05/01/20 0340 05/01/20 1620 05/02/20 0325  HGB 9.2*   < > 9.0* 9.6* 9.8*  HCT 26.5*   < > 26.9* 30.2* 31.2*  PLT 107*   < > 110* 116* 128*  APTT  --   --  69*  --  65*  HEPARINUNFRC  --   --  0.39  --  0.18*  CREATININE 1.68*  --  1.67*  --  1.48*   < > = values in this interval not displayed.    Estimated Creatinine Clearance: 55.3 mL/min (A) (by C-G formula based on SCr of 1.48 mg/dL (H)).   Assessment: 74yom with Afib on apixaban.  EF 30% diuresed and volume status stable.  Plan for TAVR this week.  Will hold apixaban for upcoming procedure and bridge with heparin. Last dose apixaban 2/3 am.  -Heparin level down to subtherapeutic (0.18) this morning, PTT 65 sec. Will utilize heparin levels to monitor in the future. Hgb stable.  Goal of Therapy:  Heparin level 0.3-0.7 units/ml  aptt 66-102 sec Monitor platelets by anticoagulation protocol: Yes   Plan:  Increase heparin to 1200 uts/hr  Will f/u 6 hr heparin level  Sherlon Handing, PharmD, BCPS Please see amion for complete clinical pharmacist phone list 05/02/2020 4:10 AM

## 2020-05-02 NOTE — Progress Notes (Signed)
Patient ID: John [name], male   DOB: 10/08/1945, 75 y.o.   MRN: [id number] TCTS:  I have personally reviewed his medical record, echo, cath and CTA studies. Agree with need for TAVR. His abdominal and pelvic CTA shows bulky calcification at the aorto-iliac bifurcation protruding into the lumen causing significant obstruction of both common iliacs. I think we will probably need alternative access to pass a 8F sheath. His left subclavian artery is large enough with no significant disease. The pacer generator is probably sitting low enough to allow access of the artery below the left clavicle. I discussed the surgical procedure, alternatives, benefits and risks with the patient and he agrees to proceed tomorrow.

## 2020-05-02 NOTE — Plan of Care (Signed)
  Problem: Education: Goal: Knowledge of General Education information will improve Description: Including pain rating scale, medication(s)/side effects and non-pharmacologic comfort measures Outcome: Progressing   Problem: Clinical Measurements: Goal: Diagnostic test results will improve Outcome: Progressing   Problem: Clinical Measurements: Goal: Cardiovascular complication will be avoided Outcome: Progressing   Problem: Activity: Goal: Risk for activity intolerance will decrease Outcome: Progressing   Problem: Nutrition: Goal: Adequate nutrition will be maintained Outcome: Progressing   Problem: Pain Managment: Goal: General experience of comfort will improve Outcome: Progressing   Problem: Skin Integrity: Goal: Risk for impaired skin integrity will decrease Outcome: Progressing

## 2020-05-02 NOTE — Progress Notes (Signed)
Physical Therapy Treatment Patient Details Name: John [name] MRN: [id number] DOB: Apr 22, 1945 Today's Date: 05/02/2020    History of Present Illness Pt admit with chronic heart failure and Afib. Pt work up for TAVR.    PT Comments    Pt reports willing to work with therapy however just got back to bed after sitting up most of the day. Pt also requests not to ambulate due to frequent urination with Lasik. Pt able to perform 5 x sit<>stand with seated rest breaks due to SoB. Pt able to stand for approximately 30 sec at a bout. When in seated pt able to perform LE exercise. D/c plans remain appropriate. PT will continue to follow acutely.     Follow Up Recommendations  Home health PT;Supervision/Assistance - 24 hour     Equipment Recommendations  None recommended by PT    Recommendations for Other Services       Precautions / Restrictions Precautions Precautions: Fall Restrictions Weight Bearing Restrictions: No    Mobility  Bed Mobility Overal bed mobility: Needs Assistance Bed Mobility: Supine to Sit;Sit to Supine     Supine to sit: Supervision Sit to supine: Supervision   General bed mobility comments: supervision for safety and management of lines  Transfers Overall transfer level: Needs assistance   Transfers: Sit to/from Stand Sit to Stand: Min guard;From elevated surface         General transfer comment: increased time, and use of bed rail for power up  Ambulation/Gait             General Gait Details: deferred due to frequent urination      Balance Overall balance assessment: Needs assistance Sitting-balance support: No upper extremity supported;Feet supported Sitting balance-Leahy Scale: Fair     Standing balance support: No upper extremity supported Standing balance-Leahy Scale: Fair Standing balance comment: able to static stand without assist                            Cognition Arousal/Alertness: Awake/alert Behavior  During Therapy: WFL for tasks assessed/performed Overall Cognitive Status: Within Functional Limits for tasks assessed                                        Exercises General Exercises - Lower Extremity Long Arc Quad: AROM;Both;10 reps;Seated Hip Flexion/Marching: AROM;Both;10 reps;Seated Other Exercises Other Exercises: sit<>stand x 5 with rest break due to SoB in standing Other Exercises: standing balance 30 sec x3 Other Exercises: [place] standing balance 10 sec x 1    General Comments General comments (skin integrity, edema, etc.): no longer complains of L knee pain, VSS on RA      Pertinent Vitals/Pain Pain Assessment: Faces Faces Pain Scale: Hurts a little bit Pain Location: generalized with movement Pain Descriptors / Indicators: Grimacing;Discomfort;Tender Pain Intervention(s): Monitored during session;Repositioned           PT Goals (current goals can now be found in the care plan section) Acute Rehab PT Goals Patient Stated Goal: home PT Goal Formulation: With patient/family Time For Goal Achievement: 05/12/20 Potential to Achieve Goals: Good Progress towards PT goals: Progressing toward goals    Frequency    Min 3X/week      PT Plan Current plan remains appropriate       AM-PAC PT "6 Clicks" Mobility   Outcome Measure  Help needed turning from  your back to your side while in a flat bed without using bedrails?: None Help needed moving from lying on your back to sitting on the side of a flat bed without using bedrails?: None Help needed moving to and from a bed to a chair (including a wheelchair)?: A Little Help needed standing up from a chair using your arms (e.g., wheelchair or bedside chair)?: A Little Help needed to walk in hospital room?: A Little Help needed climbing 3-5 steps with a railing? : A Lot 6 Click Score: 19    End of Session   Activity Tolerance: Other (comment);Patient tolerated treatment well (mobility limited  by frequent urination) Patient left: with call bell/phone within reach;with family/visitor present;in bed Nurse Communication: Mobility status PT Visit Diagnosis: Muscle weakness (generalized) (M62.81);Unsteadiness on feet (R26.81)     Time: 3474-2595 PT Time Calculation (min) (ACUTE ONLY): 25 min  Charges:  $Therapeutic Exercise: 23-37 mins                     Darrin Koman B. Migdalia Dk PT, DPT Acute Rehabilitation Services Pager [phone] Office [phone]    [place] 05/02/2020, 5:02 PM

## 2020-05-02 NOTE — Progress Notes (Signed)
Inpatient Diabetes Program Recommendations  AACE/ADA: New Consensus Statement on Inpatient Glycemic Control (2015)  Target Ranges:  Prepandial:   less than 140 mg/dL      Peak postprandial:   less than 180 mg/dL (1-2 hours)      Critically ill patients:  140 - 180 mg/dL   Lab Results  Component Value Date   GLUCAP 130 (H) 05/02/2020   HGBA1C 6.8 (H) 03/09/2020    Review of Glycemic Control Results for ISAID, SALVIA (MRN [id number]) as of 05/02/2020 09:30  Ref. Range 05/01/2020 06:16 05/01/2020 11:31 05/01/2020 15:29 05/01/2020 21:06 05/02/2020 06:11  Glucose-Capillary Latest Ref Range: 70 - 99 mg/dL 124 (H) 223 (H) 256 (H) 161 (H) 130 (H)   Diabetes history: DM 2 Outpatient Diabetes medications:  Farxiga 10 mg q AM, Tyler Aas 20 units daily, Trulicity 1.5 mg weekly Current orders for Inpatient glycemic control:  Lantus 5 units daily Novolog sensitive tid with meals Inpatient Diabetes Program Recommendations:   May consider adding Novolog meal coverage 2 units tid with meals (hold if patient eats less than 50% or NPO).   Thanks,  Adah Perl, RN, BC-ADM Inpatient Diabetes Coordinator Pager [phone] (8a-5p)

## 2020-05-02 NOTE — Progress Notes (Addendum)
Patient ID: John [name], male   DOB: Nov 09, 1945, 75 y.o.   MRN: [id number]    Advanced Heart Failure Rounding Note   Subjective:    CVP still 16 this morning. Brisk output with addition of metolazone yesterday.  Cr continues to slowly improve now at baseline.  Remains on dobutamine 2.5, co ox 53%  CT chest (2/4) with left mid to lower chest wall hematoma extending to upper abdomen.  No RP hematoma.  No underlying rib fracture.  Hgb stable, hematoma stable no longer painful.  He reports breathing continues to improve.  Denies chest pain or chest wall pain.    RA = 12 RV = 51/11 PA = 52/15 (29) PCW = 29 ( v = 44) Fick cardiac output/index = 4.5/2.0 Thermo CO/CI = 5.2/2.3 PVR = < 1.0 WU Ao sat = 110% PA sat = 64%, 66%  Assessment: 1. Markedly elevated left sided filling pressures with prominent v-waves in PCWP tracing 2. Moderate reduced cardiac index 3. SBPs in 90s with severe AS   Objective:   Weight Range:  Vital Signs:   Temp:  [97.6 F (36.4 C)-98.3 F (36.8 C)] 97.9 F (36.6 C) (02/07 0747) Pulse Rate:  [62-72] 69 (02/07 0747) Resp:  [16-20] 19 (02/07 0747) BP: (104-120)/(54-76) 110/64 (02/07 0747) SpO2:  [98 %] 98 % (02/07 0315) Weight:  [104.6 kg] 104.6 kg (02/07 0315) Last BM Date: 04/30/20  Weight change: Filed Weights   04/30/20 0631 05/01/20 0639 05/02/20 0315  Weight: 106.1 kg 106.8 kg 104.6 kg    Intake/Output:   Intake/Output Summary (Last 24 hours) at 05/02/2020 0819 Last data filed at 05/02/2020 0700 Gross per 24 hour  Intake 944.73 ml  Output 2575 ml  Net -1630.27 ml     Physical Exam: Cardiac: JVD 12cm, normal rate and rhythm, clear s1 and s2, 3/6 AS murmur, rubs or gallops, 1-2+ LE edema bilaterally Pulmonary: CTAB, not in distress Abdominal: non distended abdomen, soft and nontender Skin: Large hematoma along left mid axillary line appears stable in size, no longer tender Psych: Alert, conversant, in good spirits   Telemetry: AF, RV  paced.  Personally reviewed   Labs: Basic Metabolic Panel: Recent Labs  Lab 04/28/20 0034 04/28/20 1348 04/29/20 0402 04/30/20 0400 05/01/20 0340 05/02/20 0325  NA 135 139  137 132* 131* 133* 130*  K 4.3 4.2  4.5 4.5 3.7 3.5 3.3*  CL 99  --  99 98 99 95*  CO2 25  --  24 22 23 23   GLUCOSE 162*  --  120* 122* 139* 240*  BUN 15  --  19 18 20 20   CREATININE 1.81*  --  1.93* 1.68* 1.67* 1.48*  CALCIUM 8.0*  --  7.9* 7.5* 7.7* 7.8*    Liver Function Tests: Recent Labs  Lab 04/29/20 0402  AST 33  ALT 15  ALKPHOS 127*  BILITOT 3.9*  PROT 5.5*  ALBUMIN 2.3*   No results for input(s): LIPASE, AMYLASE in the last 168 hours. No results for input(s): AMMONIA in the last 168 hours.  CBC: Recent Labs  Lab 04/30/20 0400 04/30/20 1537 05/01/20 0340 05/01/20 1620 05/02/20 0325  WBC 8.6 9.5 8.4 9.5 9.3  HGB 9.2* 9.0* 9.0* 9.6* 9.8*  HCT 26.5* 28.0* 26.9* 30.2* 31.2*  MCV 93.0 95.2 94.1 95.6 96.0  PLT 107* 105* 110* 116* 128*    Cardiac Enzymes: No results for input(s): CKTOTAL, CKMB, CKMBINDEX, TROPONINI in the last 168 hours.  BNP: BNP (last 3 results) Recent  Labs    03/09/20 1544 03/20/20 1253 04/25/20 1157  BNP 1,540.0* 2,072.0* 2,410.2*    ProBNP (last 3 results) No results for input(s): PROBNP in the last 8760 hours.    Other results:  Imaging: No results found.   Medications:     Scheduled Medications: . Chlorhexidine Gluconate Cloth  6 each Topical Daily  . dapagliflozin propanediol  10 mg Oral QAC breakfast  . feeding supplement  237 mL Oral BID BM  . insulin aspart  0-9 Units Subcutaneous TID WC  . insulin glargine  5 Units Subcutaneous QHS  . sodium chloride flush  10-40 mL Intracatheter Q12H  . sodium chloride flush  3 mL Intravenous Q12H  . sodium chloride flush  3 mL Intravenous Q12H    Infusions: . sodium chloride    . sodium chloride    . DOBUTamine 2.5 mcg/kg/min (05/01/20 0540)  . furosemide (LASIX) 200 mg in dextrose 5%  100 mL (87m/mL) infusion 15 mg/hr (05/01/20 2013)  . heparin 1,200 Units/hr (05/02/20 0415)    PRN Medications: sodium chloride, sodium chloride, acetaminophen, diclofenac Sodium, ondansetron (ZOFRAN) IV, sodium chloride flush, sodium chloride flush, sodium chloride flush, traMADol   Assessment/Plan:   1. 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 Aortic valve also appears severely calcified and moderate-severely stenosed, suspect AS also contributing to CM. [facility]  - RHC with marginal CO and dobutamine started.  Continue dobutamine 2.5 - CVP remains elevated, improved output yesterday with metolazone and increased lasix dose.  Weight down 2kg, net neg 2L with continued improvement in cr  -continue lasix gtt hold metolazone given worsening hyponatremia will increase drip from 15 to 279mhr - TAVR scheduled for Tuesday 05/03/20  2. Mitral Regurgitation  - Moderate by TEE 8/21. - Severe by TTE 04/26/20 and consistent with severe functional MR - Hopefully will improve with AVR.   3. Aortic Stenosis  - severe low flow/low gradient aortic stenosis with mean gradient 15.5 mmHg, dimensionless index 0.23, AVA 0.86 cm2  -ECHO 2/1 with similar findings mean gradient 21, AVA 0.87 cm2 -TAVR scheduled for Tuesday 05/03/20    4. Atrial Flutter/ Chronic Atrial Fibrillation - s/p AFL ablation. -In chronic Afib, rate controlled. -Switched to heparin on 2/3 for TAVR procedure, holding eliquis - continue holding bb - Heparin gtt restarted 2/5, hgb has remained stable.   5. H/o CHB  - s/p PPM followed by Dr. TaLovena Le- ? RV paced CM. He is pacing ~40%. - Will optimize patient's volume  and discuss with EP BIV upgrade after TAVR  6. NASH cirrhosis -followed by Dr. CaJenetta DownerNAFLD socre 2.90 consistent with advanced fibrosis -Elastography ordered but not yet complete -no prior history of bleeding or known varices  7. Stage IIIb CKD -Followed by nephrology. -Baseline SCr ~1.5-1.8 - Creatinine improving with diuresis.  Follow with diuresis.   8. T2DM -On insulin.Hgba1C 6.8 (12/21) -On farxiga - no further hypoglycemia on current basal ssi regimen, will need to hold lantus night before procedure  9. Chest wall hematoma - Uncertain etiology. No underlying rib fracture, no RP hematoma.  - Hgb stable at 9.8, platelets stable 128k.  - Stable appearing, heparin gtt  held briefly 2/4 restarted 2/5    Length of Stay: 7   Katherine Roan MD 05/02/2020, 8:19 AM  Advanced Heart Failure Team Pager [phone] (M-F; 7a - 4p)  Please contact [facility] for night-coverage after hours (4p -7a ) and weekends on [email or web address]  Patient seen and examined with the above-signed Advanced Practice Provider and/or Housestaff. I personally reviewed laboratory data, imaging studies and relevant notes. I independently examined the patient and formulated the important aspects of the plan. I have edited the note to reflect any of my changes or salient points. I have personally discussed the plan with the patient and/or family.  Feeling better. Less SOB. No orthopnea or PND. Developed chest wall hematoma over the weekend. Heparin stopped. HGb now stable.   Remains on dobutamine and lasix gtt. Diuresis improved with metolazone yesterday. K low. CVP still 12. Co-ox marginal at 53%  General:  Sitting up No resp difficulty HEENT: normal Neck: supple. JVP to jaw . Carotids 2+ bilat;+ bruits. No lymphadenopathy or thryomegaly appreciated. Cor: PMI nondisplaced. Regular rate & rhythm. 2/6 AS s2 inaudible  + left chest wall ecchymosis Lungs: clear Abdomen: soft, nontender, nondistended. No  hepatosplenomegaly. No bruits or masses. Good bowel sounds. Extremities: no cyanosis, clubbing, rash, edema Neuro: alert & orientedx3, cranial nerves grossly intact. moves all 4 extremities w/o difficulty. Affect pleasant  Tenuous but improving on dobutamine and IV diuresis. Agree with increasing lasix gtt today. Supp K+. Hgb stable after chest wall bleed. Now back on low-dose heparin. Follow closely. D/w Structural team. Plan TAVR tomorrow.   Glori Bickers, MD  4:52 PM

## 2020-05-02 NOTE — Progress Notes (Signed)
ANTICOAGULATION CONSULT NOTE - Follow Up Consult  Pharmacy Consult for heparin  Indication: atrial fibrillation  No Known Allergies  Patient Measurements: Weight: 104.6 kg (230 lb 9.6 oz) Heparin Dosing Weight: 100kg   Vital Signs: Temp: 98.1 F (36.7 C) (02/07 1934) Temp Source: Oral (02/07 1934) BP: 114/94 (02/07 1934) Pulse Rate: 62 (02/07 1934)  Labs: Recent Labs    04/30/20 0400 04/30/20 1537 05/01/20 0340 05/01/20 1620 05/02/20 0325 05/02/20 1007 05/02/20 1450 05/02/20 1940  HGB 9.2*   < > 9.0* 9.6* 9.8*  --   --   --   HCT 26.5*   < > 26.9* 30.2* 31.2*  --   --   --   PLT 107*   < > 110* 116* 128*  --   --   --   APTT  --   --  69*  --  65*  --   --   --   LABPROT  --   --   --   --   --   --  16.2*  --   INR  --   --   --   --   --   --  1.4*  --   HEPARINUNFRC  --    < > 0.39  --  0.18* 0.18*  --  0.22*  CREATININE 1.68*  --  1.67*  --  1.48*  --   --   --    < > = values in this interval not displayed.    Estimated Creatinine Clearance: 54.8 mL/min (A) (by C-G formula based on SCr of 1.48 mg/dL (H)).   Assessment: 74yom with Afib on apixaban.  EF 30% diuresed and volume status stable.  Plan for TAVR this week.  Will hold apixaban for upcoming procedure and bridge with heparin. Last dose apixaban 2/3 am.   Heparin level this evening came back subtherapeutic at 0.22 despite rate increase to 1400 units/hr. No s/sx of bleeding or infusion issues per RN.   Plan for TAVR in am.   Goal of Therapy:  Heparin level 0.3-0.7 units/ml  Monitor platelets by anticoagulation protocol: Yes   Plan:  Increase heparin to 1500 uts/hr  Monitor daily HL, CBC, and for s/sx of bleeding   Erin Hearing PharmD., BCPS Clinical Pharmacist 05/02/2020 8:33 PM

## 2020-05-03 ENCOUNTER — Inpatient Hospital Stay (HOSPITAL_COMMUNITY): Payer: Medicare HMO

## 2020-05-03 ENCOUNTER — Encounter (HOSPITAL_COMMUNITY)
Admission: AD | Disposition: A | Discharge status: Home / Self Care | Payer: Medicare HMO | Source: Ambulatory Visit | Attending: Internal Medicine

## 2020-05-03 ENCOUNTER — Encounter (HOSPITAL_COMMUNITY): Payer: Self-pay | Admitting: Internal Medicine

## 2020-05-03 ENCOUNTER — Inpatient Hospital Stay (HOSPITAL_COMMUNITY): Payer: Medicare HMO | Admitting: Anesthesiology

## 2020-05-03 ENCOUNTER — Other Ambulatory Visit: Payer: Self-pay | Admitting: Physician Assistant

## 2020-05-03 DIAGNOSIS — Z952 Presence of prosthetic heart valve: Secondary | ICD-10-CM

## 2020-05-03 DIAGNOSIS — I130 Hypertensive heart and chronic kidney disease with heart failure and stage 1 through stage 4 chronic kidney disease, or unspecified chronic kidney disease: Secondary | ICD-10-CM | POA: Diagnosis not present

## 2020-05-03 DIAGNOSIS — Z006 Encounter for examination for normal comparison and control in clinical research program: Secondary | ICD-10-CM

## 2020-05-03 DIAGNOSIS — I482 Chronic atrial fibrillation, unspecified: Secondary | ICD-10-CM | POA: Diagnosis not present

## 2020-05-03 DIAGNOSIS — Z20822 Contact with and (suspected) exposure to covid-19: Secondary | ICD-10-CM | POA: Diagnosis not present

## 2020-05-03 DIAGNOSIS — I5043 Acute on chronic combined systolic (congestive) and diastolic (congestive) heart failure: Secondary | ICD-10-CM | POA: Diagnosis not present

## 2020-05-03 DIAGNOSIS — N1832 Chronic kidney disease, stage 3b: Secondary | ICD-10-CM | POA: Diagnosis not present

## 2020-05-03 DIAGNOSIS — I350 Nonrheumatic aortic (valve) stenosis: Secondary | ICD-10-CM

## 2020-05-03 HISTORY — PX: TEE WITHOUT CARDIOVERSION: SHX5443

## 2020-05-03 HISTORY — DX: Presence of prosthetic heart valve: Z95.2

## 2020-05-03 LAB — GLUCOSE, CAPILLARY
Glucose-Capillary: 105 mg/dL — ABNORMAL HIGH (ref 70–99)
Glucose-Capillary: 105 mg/dL — ABNORMAL HIGH (ref 70–99)
Glucose-Capillary: 115 mg/dL — ABNORMAL HIGH (ref 70–99)
Glucose-Capillary: 115 mg/dL — ABNORMAL HIGH (ref 70–99)
Glucose-Capillary: 112 mg/dL — ABNORMAL HIGH (ref 70–99)

## 2020-05-03 LAB — CBC
HCT: 30.7 % — ABNORMAL LOW (ref 39.0–52.0)
Hemoglobin: 9.9 g/dL — ABNORMAL LOW (ref 13.0–17.0)
MCH: 30.3 pg (ref 26.0–34.0)
MCHC: 32.2 g/dL (ref 30.0–36.0)
MCV: 93.9 fL (ref 80.0–100.0)
Platelets: 142 10*3/uL — ABNORMAL LOW (ref 150–400)
RBC: 3.27 MIL/uL — ABNORMAL LOW (ref 4.22–5.81)
RDW: 18 % — ABNORMAL HIGH (ref 11.5–15.5)
WBC: 8.3 10*3/uL (ref 4.0–10.5)
nRBC: 0 % (ref 0.0–0.2)

## 2020-05-03 LAB — POCT I-STAT, CHEM 8
BUN: 22 mg/dL (ref 8–23)
BUN: 21 mg/dL (ref 8–23)
BUN: 22 mg/dL (ref 8–23)
Calcium, Ion: 1.1 mmol/L — ABNORMAL LOW (ref 1.15–1.40)
Calcium, Ion: 1.14 mmol/L — ABNORMAL LOW (ref 1.15–1.40)
Calcium, Ion: 1.12 mmol/L — ABNORMAL LOW (ref 1.15–1.40)
Chloride: 94 mmol/L — ABNORMAL LOW (ref 98–111)
Chloride: 95 mmol/L — ABNORMAL LOW (ref 98–111)
Chloride: 97 mmol/L — ABNORMAL LOW (ref 98–111)
Creatinine, Ser: 1.4 mg/dL — ABNORMAL HIGH (ref 0.61–1.24)
Creatinine, Ser: 1.4 mg/dL — ABNORMAL HIGH (ref 0.61–1.24)
Creatinine, Ser: 1.4 mg/dL — ABNORMAL HIGH (ref 0.61–1.24)
Glucose, Bld: 118 mg/dL — ABNORMAL HIGH (ref 70–99)
Glucose, Bld: 116 mg/dL — ABNORMAL HIGH (ref 70–99)
Glucose, Bld: 118 mg/dL — ABNORMAL HIGH (ref 70–99)
HCT: 34 % — ABNORMAL LOW (ref 39.0–52.0)
HCT: 32 % — ABNORMAL LOW (ref 39.0–52.0)
HCT: 32 % — ABNORMAL LOW (ref 39.0–52.0)
Hemoglobin: 11.6 g/dL — ABNORMAL LOW (ref 13.0–17.0)
Hemoglobin: 10.9 g/dL — ABNORMAL LOW (ref 13.0–17.0)
Hemoglobin: 10.9 g/dL — ABNORMAL LOW (ref 13.0–17.0)
Potassium: 3.4 mmol/L — ABNORMAL LOW (ref 3.5–5.1)
Potassium: 3.3 mmol/L — ABNORMAL LOW (ref 3.5–5.1)
Potassium: 3.3 mmol/L — ABNORMAL LOW (ref 3.5–5.1)
Sodium: 136 mmol/L (ref 135–145)
Sodium: 135 mmol/L (ref 135–145)
Sodium: 136 mmol/L (ref 135–145)
TCO2: 29 mmol/L (ref 22–32)
TCO2: 27 mmol/L (ref 22–32)
TCO2: 27 mmol/L (ref 22–32)

## 2020-05-03 LAB — COMPREHENSIVE METABOLIC PANEL
ALT: 15 U/L (ref 0–44)
AST: 28 U/L (ref 15–41)
Albumin: 2.3 g/dL — ABNORMAL LOW (ref 3.5–5.0)
Alkaline Phosphatase: 118 U/L (ref 38–126)
Anion gap: 11 (ref 5–15)
BUN: 20 mg/dL (ref 8–23)
CO2: 25 mmol/L (ref 22–32)
Calcium: 8.1 mg/dL — ABNORMAL LOW (ref 8.9–10.3)
Chloride: 95 mmol/L — ABNORMAL LOW (ref 98–111)
Creatinine, Ser: 1.58 mg/dL — ABNORMAL HIGH (ref 0.61–1.24)
GFR, Estimated: 46 mL/min — ABNORMAL LOW (ref >60)
Glucose, Bld: 128 mg/dL — ABNORMAL HIGH (ref 70–99)
Potassium: 3.3 mmol/L — ABNORMAL LOW (ref 3.5–5.1)
Sodium: 131 mmol/L — ABNORMAL LOW (ref 135–145)
Total Bilirubin: 4.1 mg/dL — ABNORMAL HIGH (ref 0.3–1.2)
Total Protein: 6 g/dL — ABNORMAL LOW (ref 6.5–8.1)

## 2020-05-03 LAB — BLOOD GAS, ARTERIAL
Acid-Base Excess: 2.6 mmol/L — ABNORMAL HIGH (ref 0.0–2.0)
Bicarbonate: 26 mmol/L (ref 20.0–28.0)
Drawn by: 23604
FIO2: 21
O2 Saturation: 97.1 %
Patient temperature: 36.5
pCO2 arterial: 34.8 mmHg (ref 32.0–48.0)
pH, Arterial: 7.484 — ABNORMAL HIGH (ref 7.350–7.450)
pO2, Arterial: 79.2 mmHg — ABNORMAL LOW (ref 83.0–108.0)

## 2020-05-03 LAB — COOXEMETRY PANEL
Carboxyhemoglobin: 2.5 % — ABNORMAL HIGH (ref 0.5–1.5)
Methemoglobin: 0.9 % (ref 0.0–1.5)
O2 Saturation: 64.7 %
Total hemoglobin: 9.9 g/dL — ABNORMAL LOW (ref 12.0–16.0)

## 2020-05-03 LAB — ECHO INTRAOPERATIVE TEE
AR max vel: 1.54 cm2
AV Area VTI: 1.58 cm2
AV Area mean vel: 1.37 cm2
AV Mean grad: 18.3 mmHg
AV Peak grad: 29.2 mmHg
Ao pk vel: 2.7 m/s
Height: 72 in
Weight: 3689.62 oz

## 2020-05-03 LAB — HEPARIN LEVEL (UNFRACTIONATED): Heparin Unfractionated: 0.28 IU/mL — ABNORMAL LOW (ref 0.30–0.70)

## 2020-05-03 LAB — MAGNESIUM: Magnesium: 2.6 mg/dL — ABNORMAL HIGH (ref 1.7–2.4)

## 2020-05-03 LAB — HEMOGLOBIN A1C
Hgb A1c MFr Bld: 5.7 % — ABNORMAL HIGH (ref 4.8–5.6)
Mean Plasma Glucose: 116.89 mg/dL

## 2020-05-03 SURGERY — IMPLANTATION, AORTIC VALVE, TRANSCATHETER, SUBCLAVIAN ARTERY APPROACH
Anesthesia: General | Site: Chest

## 2020-05-03 MED ORDER — PHENYLEPHRINE HCL-NACL 10-0.9 MG/250ML-% IV SOLN
0.0000 ug/min | INTRAVENOUS | Status: DC
  Filled 2020-05-03: qty 250

## 2020-05-03 MED ORDER — ONDANSETRON HCL 4 MG/2ML IJ SOLN
INTRAMUSCULAR | Status: DC | PRN
  Administered 2020-05-03: 4 mg via INTRAVENOUS

## 2020-05-03 MED ORDER — ASPIRIN 81 MG PO CHEW
81.0000 mg | CHEWABLE_TABLET | Freq: Every day | ORAL | Status: DC
  Administered 2020-05-04 – 2020-05-15 (×12): 81 mg via ORAL
  Filled 2020-05-03 (×13): qty 1

## 2020-05-03 MED ORDER — SODIUM CHLORIDE 0.9 % IV SOLN
250.0000 mL | INTRAVENOUS | Status: DC | PRN
  Administered 2020-05-04: 250 mL via INTRAVENOUS
  Administered 2020-05-06: 1000 mL via INTRAVENOUS

## 2020-05-03 MED ORDER — SODIUM CHLORIDE 0.9% FLUSH: 3.0000 mL | Freq: Two times a day (BID) | INTRAVENOUS | Status: DC

## 2020-05-03 MED ORDER — IODIXANOL 320 MG/ML IV SOLN
INTRAVENOUS | Status: DC | PRN
  Administered 2020-05-03: 40 mL

## 2020-05-03 MED ORDER — ONDANSETRON HCL 4 MG/2ML IJ SOLN
INTRAMUSCULAR | Status: AC
  Filled 2020-05-03: qty 2

## 2020-05-03 MED ORDER — PROPOFOL 10 MG/ML IV BOLUS
INTRAVENOUS | Status: DC | PRN
  Administered 2020-05-03: 100 mg via INTRAVENOUS

## 2020-05-03 MED ORDER — NITROGLYCERIN IN D5W 200-5 MCG/ML-% IV SOLN: 0.0000 ug/min | INTRAVENOUS | Status: DC

## 2020-05-03 MED ORDER — CHLORHEXIDINE GLUCONATE 0.12 % MT SOLN
15.0000 mL | Freq: Once | OROMUCOSAL | Status: AC
  Administered 2020-05-03: 15 mL via OROMUCOSAL
  Filled 2020-05-03: qty 15

## 2020-05-03 MED ORDER — MIDAZOLAM HCL 2 MG/2ML IJ SOLN
INTRAMUSCULAR | Status: AC
  Filled 2020-05-03: qty 2

## 2020-05-03 MED ORDER — SODIUM CHLORIDE 0.9 % IV SOLN
INTRAVENOUS | Status: AC
  Filled 2020-05-03 (×3): qty 1.2

## 2020-05-03 MED ORDER — PHENYLEPHRINE HCL (PRESSORS) 10 MG/ML IV SOLN
INTRAVENOUS | Status: DC | PRN
  Administered 2020-05-03: 40 ug via INTRAVENOUS

## 2020-05-03 MED ORDER — FENTANYL CITRATE (PF) 250 MCG/5ML IJ SOLN
INTRAMUSCULAR | Status: DC | PRN
  Administered 2020-05-03: 50 ug via INTRAVENOUS

## 2020-05-03 MED ORDER — SODIUM CHLORIDE 0.9 % IV SOLN
INTRAVENOUS | Status: DC | PRN
  Administered 2020-05-03: 500 mL

## 2020-05-03 MED ORDER — SODIUM CHLORIDE 0.9 % IV SOLN: INTRAVENOUS | Status: AC

## 2020-05-03 MED ORDER — ROCURONIUM BROMIDE 10 MG/ML (PF) SYRINGE
PREFILLED_SYRINGE | INTRAVENOUS | Status: DC | PRN
  Administered 2020-05-03: 50 mg via INTRAVENOUS

## 2020-05-03 MED ORDER — FENTANYL CITRATE (PF) 250 MCG/5ML IJ SOLN
INTRAMUSCULAR | Status: AC
  Filled 2020-05-03: qty 5

## 2020-05-03 MED ORDER — LACTATED RINGERS IV SOLN: INTRAVENOUS | Status: DC

## 2020-05-03 MED ORDER — SODIUM CHLORIDE 0.9% FLUSH
3.0000 mL | INTRAVENOUS | Status: DC | PRN
  Administered 2020-05-11: 3 mL via INTRAVENOUS

## 2020-05-03 MED ORDER — PHENYLEPHRINE HCL-NACL 20-0.9 MG/250ML-% IV SOLN: 0.0000 ug/min | INTRAVENOUS | Status: DC

## 2020-05-03 MED ORDER — VANCOMYCIN HCL IN DEXTROSE 1-5 GM/200ML-% IV SOLN
1000.0000 mg | Freq: Once | INTRAVENOUS | Status: AC
  Administered 2020-05-04: 1000 mg via INTRAVENOUS
  Filled 2020-05-03: qty 200

## 2020-05-03 MED ORDER — HEPARIN SODIUM (PORCINE) 1000 UNIT/ML IJ SOLN
INTRAMUSCULAR | Status: DC | PRN
  Administered 2020-05-03: 16000 [IU] via INTRAVENOUS

## 2020-05-03 MED ORDER — SODIUM CHLORIDE 0.9 % IV SOLN: INTRAVENOUS | Status: DC

## 2020-05-03 MED ORDER — SODIUM CHLORIDE 0.9 % IV SOLN
1.5000 g | Freq: Two times a day (BID) | INTRAVENOUS | Status: AC
  Administered 2020-05-04 – 2020-05-05 (×4): 1.5 g via INTRAVENOUS
  Filled 2020-05-03 (×4): qty 1.5

## 2020-05-03 MED ORDER — PROTAMINE SULFATE 10 MG/ML IV SOLN
INTRAVENOUS | Status: DC | PRN
  Administered 2020-05-03: 160 mg via INTRAVENOUS

## 2020-05-03 MED ORDER — LIDOCAINE HCL 1 % IJ SOLN
INTRAMUSCULAR | Status: AC
  Filled 2020-05-03: qty 20

## 2020-05-03 MED ORDER — MIDAZOLAM HCL 2 MG/2ML IJ SOLN
INTRAMUSCULAR | Status: DC | PRN
  Administered 2020-05-03: 2 mg via INTRAVENOUS

## 2020-05-03 MED ORDER — MORPHINE SULFATE (PF) 2 MG/ML IV SOLN
1.0000 mg | INTRAVENOUS | Status: DC | PRN
  Administered 2020-05-07: 2 mg via INTRAVENOUS
  Filled 2020-05-03: qty 1

## 2020-05-03 MED ORDER — ORAL CARE MOUTH RINSE: 15.0000 mL | Freq: Once | OROMUCOSAL | Status: AC

## 2020-05-03 SURGICAL SUPPLY — 69 items
BAG SNAP BAND KOVER 36X36 (MISCELLANEOUS) ×3 IMPLANT
CABLE ADAPT CONN TEMP 6FT (ADAPTER) ×3 IMPLANT
CATH DIAG EXPO 6F VENT PIG 145 (CATHETERS) ×6 IMPLANT
CATH INFINITI 6F AL2 (CATHETERS) ×1 IMPLANT
CATH S G BIP PACING (CATHETERS) ×3 IMPLANT
CLIP VESOCCLUDE MED 24/CT (CLIP) ×1 IMPLANT
CLIP VESOCCLUDE SM WIDE 24/CT (CLIP) ×1 IMPLANT
CLOSURE MYNX CONTROL 6F/7F (Vascular Products) ×1 IMPLANT
CNTNR URN SCR LID CUP LEK RST (MISCELLANEOUS) ×4 IMPLANT
CONT SPEC 4OZ STRL OR WHT (MISCELLANEOUS) ×6
COVER BACK TABLE 80X110 HD (DRAPES) ×5 IMPLANT
DERMABOND ADHESIVE PROPEN (GAUZE/BANDAGES/DRESSINGS) ×1
DERMABOND ADVANCED (GAUZE/BANDAGES/DRESSINGS) ×1
DERMABOND ADVANCED .7 DNX12 (GAUZE/BANDAGES/DRESSINGS) ×2 IMPLANT
DERMABOND ADVANCED .7 DNX6 (GAUZE/BANDAGES/DRESSINGS) IMPLANT
DRSG TEGADERM 4X4.75 (GAUZE/BANDAGES/DRESSINGS) ×5 IMPLANT
ELECT BLADE 4.0 EZ CLEAN MEGAD (MISCELLANEOUS) ×3
ELECT REM PT RETURN 9FT ADLT (ELECTROSURGICAL) ×6
ELECTRODE BLDE 4.0 EZ CLN MEGD (MISCELLANEOUS) IMPLANT
ELECTRODE REM PT RTRN 9FT ADLT (ELECTROSURGICAL) ×4 IMPLANT
FELT TEFLON 6X6 (MISCELLANEOUS) ×1 IMPLANT
GAUZE SPONGE 2X2 8PLY STRL LF (GAUZE/BANDAGES/DRESSINGS) IMPLANT
GAUZE SPONGE 4X4 12PLY STRL (GAUZE/BANDAGES/DRESSINGS) ×3 IMPLANT
GAUZE SPONGE 4X4 12PLY STRL LF (GAUZE/BANDAGES/DRESSINGS) ×1 IMPLANT
GLOVE BIO SURGEON STRL SZ7.5 (GLOVE) ×2 IMPLANT
GLOVE BIO SURGEON STRL SZ8 (GLOVE) ×2 IMPLANT
GOWN STRL REUS W/ TWL LRG LVL3 (GOWN DISPOSABLE) IMPLANT
GOWN STRL REUS W/ TWL XL LVL3 (GOWN DISPOSABLE) ×2 IMPLANT
GOWN STRL REUS W/TWL LRG LVL3 (GOWN DISPOSABLE) ×15
GOWN STRL REUS W/TWL XL LVL3 (GOWN DISPOSABLE) ×6
GUIDEWIRE SAFE TJ AMPLATZ EXST (WIRE) ×3 IMPLANT
KIT BASIN OR (CUSTOM PROCEDURE TRAY) ×3 IMPLANT
KIT HEART LEFT (KITS) ×3 IMPLANT
KIT SUCTION CATH 14FR (SUCTIONS) ×1 IMPLANT
KIT TURNOVER KIT B (KITS) ×3 IMPLANT
LOOP VESSEL MAXI BLUE (MISCELLANEOUS) ×1 IMPLANT
NDL PERC 18GX7CM (NEEDLE) ×2 IMPLANT
NEEDLE PERC 18GX7CM (NEEDLE) ×6 IMPLANT
NS IRRIG 1000ML POUR BTL (IV SOLUTION) ×9 IMPLANT
PACK ENDOVASCULAR (PACKS) ×3 IMPLANT
PAD ARMBOARD 7.5X6 YLW CONV (MISCELLANEOUS) ×6 IMPLANT
PAD ELECT DEFIB RADIOL ZOLL (MISCELLANEOUS) ×3 IMPLANT
PENCIL BUTTON HOLSTER BLD 10FT (ELECTRODE) ×4 IMPLANT
SHEATH BRITE TIP 7FR 35CM (SHEATH) ×3 IMPLANT
SHEATH PINNACLE 6F 10CM (SHEATH) ×3 IMPLANT
SHEATH PINNACLE 8F 10CM (SHEATH) ×3 IMPLANT
SLEEVE REPOSITIONING LENGTH 30 (MISCELLANEOUS) ×3 IMPLANT
SPONGE GAUZE 2X2 STER 10/PKG (GAUZE/BANDAGES/DRESSINGS) ×1
STOPCOCK MORSE 400PSI 3WAY (MISCELLANEOUS) ×7 IMPLANT
SUT GORETEX CV 4 TH 22 36 (SUTURE) ×1 IMPLANT
SUT GORETEX CV4 TH-18 (SUTURE) ×2 IMPLANT
SUT PROLENE 5 0 C 1 36 (SUTURE) ×1 IMPLANT
SUT SILK  1 MH (SUTURE) ×3
SUT SILK 1 MH (SUTURE) ×2 IMPLANT
SUT VIC AB 2-0 CT1 27 (SUTURE) ×3
SUT VIC AB 2-0 CT1 TAPERPNT 27 (SUTURE) IMPLANT
SUT VIC AB 3-0 SH 27 (SUTURE) ×3
SUT VIC AB 3-0 SH 27X BRD (SUTURE) IMPLANT
SUT VIC AB 3-0 X1 27 (SUTURE) ×1 IMPLANT
SYR 50ML LL SCALE MARK (SYRINGE) ×3 IMPLANT
SYR BULB IRRIG 60ML STRL (SYRINGE) ×1 IMPLANT
SYR CONTROL 10ML LL (SYRINGE) IMPLANT
TAPE CLOTH SURG 4X10 WHT LF (GAUZE/BANDAGES/DRESSINGS) ×1 IMPLANT
TOWEL GREEN STERILE (TOWEL DISPOSABLE) ×3 IMPLANT
TOWEL GREEN STERILE FF (TOWEL DISPOSABLE) ×3 IMPLANT
TRANSDUCER W/STOPCOCK (MISCELLANEOUS) ×6 IMPLANT
VALVE HEART TRANSCATH SZ3 29MM (Valve) ×1 IMPLANT
WIRE EMERALD 3MM-J .035X150CM (WIRE) ×3 IMPLANT
WIRE EMERALD 3MM-J .035X260CM (WIRE) ×3 IMPLANT

## 2020-05-03 NOTE — CV Procedure (Signed)
[facility]  TAVR OPERATIVE NOTE   Date of Procedure:  05/03/2020  Preoperative Diagnosis: Severe Aortic Stenosis   Postoperative Diagnosis: Same   Procedure:    Transcatheter Aortic Valve Replacement - Left Subclavian artery Approach   Edwards Sapien 3 THV (size 29 mm, model # [id number], serial #[id number])   Co-Surgeons:  Lauree Chandler, MD and Gaye Pollack, MD   Anesthesiologist:  Lissa Hoard  Echocardiographer:  Johnsie Cancel  Pre-operative Echo Findings:  Severe aortic stenosis  Moderate left ventricular systolic dysfunction  Post-operative Echo Findings:  Trivial paravalvular leak  Moderate left ventricular systolic dysfunction  BRIEF CLINICAL NOTE AND INDICATIONS FOR SURGERY  75 yo male with history of atrial flutter s/p ablation, persistent atrial fibrillation on Eliquis, symptomatic bradycardia post permanent pacemaker placement, chronic combined systolic and diastolic CHF, non-ischemic cardiomyopathy, mitral regurgitation, CKD stage 3, diabetes mellitus, HTN, hyperlipidemia and severe aortic stenosis who is here today for TAVR. He is known to have a non-ischemic cardiomyopathy. Cardiac cath September 2021 with no evidence of CAD. Moderately elevated filling pressures by cath. Mean gradient across the aortic valve was 21 mmHg. He has chronic atrial fibrillation and is on Eliquis. Echo July 2021 with LVEF=30-35%, moderate MR and severe low flow/low gradient aortic stenosis with mean gradient 15.5 mmHg, dimensionless index 0.23, AVA 0.86 cm2. TEE August 2021 with LVEF=30-35% with global hypokinesis, moderate mitral regurgitation. The aortic valve was noted to be thickened and calcified with limited leaflet mobility.  He was admitted 03/09/20 with acute hypoxic respiratory failure secondary to pneumonia and CHF and was diuresed with IV Lasix. He was overdiuresed and developed acute kidney injury. He was seen in the ED 03/20/20 with worsened lower extremity edema  and CHF and was discharged home on higher dose of Torsemide. He was seen in the Advanced Heart Failure clinic 03/29/20 by Dr. Haroldine Laws and was noted to be markedly volume overloaded. He was referred to [facility] for IV Lasix with use of metolazone. He has since undergone CT scans for TAVR. Admitted last week with volume overload. Has been diuresed with Lasix and started on milrinone.   During the course of the patient's preoperative work up they have been evaluated comprehensively by a multidisciplinary team of specialists coordinated through the [facility] in the [facility].  They have been demonstrated to suffer from symptomatic severe aortic stenosis as noted above. The patient has been counseled extensively as to the relative risks and benefits of all options for the treatment of severe aortic stenosis including long term medical therapy, conventional surgery for aortic valve replacement, and transcatheter aortic valve replacement.  The patient has been independently evaluated by Dr. Roxy Manns and Dr. Cyndia Bent with CT surgery and they are felt to be at high risk for conventional surgical aortic valve replacement. The surgeon indicated the patient would be a poor candidate for conventional surgery. Based upon review of all of the patient's preoperative diagnostic tests they are felt to be candidate for transcatheter aortic valve replacement using the transfemoral approach as an alternative to high risk conventional surgery.    Following the decision to proceed with transcatheter aortic valve replacement, a discussion has been held regarding what types of management strategies would be attempted intraoperatively in the event of life-threatening complications, including whether or not the patient would be considered a candidate for the use of cardiopulmonary bypass and/or conversion to open sternotomy for attempted surgical intervention.  The patient has been  advised of a  variety of complications that might develop peculiar to this approach including but not limited to risks of death, stroke, paravalvular leak, aortic dissection or other major vascular complications, aortic annulus rupture, device embolization, cardiac rupture or perforation, acute myocardial infarction, arrhythmia, heart block or bradycardia requiring permanent pacemaker placement, congestive heart failure, respiratory failure, renal failure, pneumonia, infection, other late complications related to structural valve deterioration or migration, or other complications that might ultimately cause a temporary or permanent loss of functional independence or other long term morbidity.  The patient provides full informed consent for the procedure as described and all questions were answered preoperatively.    DETAILS OF THE OPERATIVE PROCEDURE  PREPARATION:   The patient is brought to the operating room on the above mentioned date and central monitoring was established by the anesthesia team including placement of a radial arterial line. The patient is placed in the supine position on the operating table.  Intravenous antibiotics are administered. General anesthesia is used.   Baseline transesophageal echocardiogram was performed. The patient's chest, abdomen, both groins, and both lower extremities are prepared and draped in a sterile manner. A time out procedure is performed.   PERIPHERAL ACCESS:   Using the modified Seldinger technique, femoral arterial and venous access were obtained with placement of a 6 Fr sheath in the artery and a 7 Fr sheath in the vein on the right side using u/s guidance.  A pigtail diagnostic catheter was passed through the femoral arterial sheath under fluoroscopic guidance into the aortic root.  A temporary transvenous pacemaker catheter was passed through the femoral venous sheath under fluoroscopic guidance into the right ventricle.  The pacemaker was tested to  ensure stable lead placement and pacemaker capture. Aortic root angiography was performed in order to determine the optimal angiographic angle for valve deployment.  TRANS Left Subclavian artery access:  Please see Dr. Vivi Martens note for full details of the surgical cutdown.  The patient was heparinized systemically and ACT verified > 250 seconds.    A needle was used to access the left subclavian artery under direct visualization. J wire advanced into the ascending aorta. A 6 French sheath was placed into the subclavian artery. An AL-1 catheter was used to cross the aortic valve with a straight wire. A long J wire was then exchanged and a pigtail catheter was placed into the left ventricle. The J wire was removed and LV pressures measured. We then advanced the Amplatz Extra Stiff wire into the pigtail catheter and positioned this wire in the LV apex. The Edwards 25 [place] sheath was advanced over the wire into the ascending aorta.    TRANSCATHETER HEART VALVE DEPLOYMENT:  An Edwards Sapien 3 THV (size 29 mm) was prepared and crimped per manufacturer's guidelines, and the proper orientation of the valve is confirmed on the [employer] delivery system. The valve was advanced through the introducer sheath using normal technique until in an appropriate position in the ascending aorta. The balloon was then retracted and using the fine-tuning wheel was centered on the valve. The valve was then advanced across the aortic arch using appropriate flexion of the catheter. The valve was carefully positioned across the aortic valve annulus. The Commander catheter was retracted using normal technique. Once final position of the valve has been confirmed by angiographic assessment, the valve is deployed while temporarily holding ventilation and during rapid ventricular pacing to maintain systolic blood pressure < 50 mmHg and pulse pressure < 10 mmHg. The balloon inflation is held for >  3 seconds after reaching full  deployment volume. Once the balloon has fully deflated the balloon is retracted into the ascending aorta and valve function is assessed using TEE. There is felt to be trivial paravalvular leak and no central aortic insufficiency.  The patient's hemodynamic recovery following valve deployment is good.  The deployment balloon and guidewire are both removed. Echo demostrated acceptable post-procedural gradients, stable mitral valve function, and trivial AI.   PROCEDURE COMPLETION:   The sheath was then removed and the artery was closed by Dr. Cyndia Bent. Please see his note for details of the surgical closure. Protamine was administered once femoral arterial repair was complete. The temporary pacemaker, pigtail catheters and femoral sheaths were removed with a Mynx closure device placed in the right femoral artery. Manual pressure used for venous hemostasis.    The patient tolerated the procedure well and is transported to the surgical intensive care in stable condition. There were no immediate intraoperative complications. All sponge instrument and needle counts are verified correct at completion of the operation.   No blood products were administered during the operation.  The patient received a total of 40 mL of intravenous contrast during the procedure.  Lauree Chandler MD 05/03/2020 5:10 PM

## 2020-05-03 NOTE — Progress Notes (Signed)
Hibiclens cleansing done tolerated well. Mouth care done.

## 2020-05-03 NOTE — Progress Notes (Signed)
  Echocardiogram Echocardiogram Transesophageal has been performed.  John [name] 05/03/2020, 5:18 PM

## 2020-05-03 NOTE — Transfer of Care (Signed)
Immediate Anesthesia Transfer of Care Note  Patient: John [name]  Procedure(s) Performed: TRANSCATHETER AORTIC VALVE REPLACEMENT, LEFT SUBCLAVIAN (Left Chest) TRANSESOPHAGEAL ECHOCARDIOGRAM (TEE) (N/A )  Patient Location: Cath Lab  Anesthesia Type:General  Level of Consciousness: drowsy and patient cooperative  Airway & Oxygen Therapy: Patient Spontanous Breathing and Patient connected to nasal cannula oxygen  Post-op Assessment: Report given to RN, Post -op Vital signs reviewed and stable and Patient moving all extremities  Post vital signs: Reviewed and stable  Last Vitals:  Vitals Value Taken Time  BP    Temp 36.3 C 05/03/20 1750  Pulse 66 05/03/20 1752  Resp 12 05/03/20 1752  SpO2 98 % 05/03/20 1752  Vitals shown include unvalidated device data.  Last Pain:  Vitals:   05/03/20 1750  TempSrc: Temporal  PainSc: Asleep      Patients Stated Pain Goal: 0 (14/60/47 9987)  Complications: No complications documented.

## 2020-05-03 NOTE — Anesthesia Procedure Notes (Signed)
Arterial Line Insertion Start/End2/10/2020 2:04 PM, 05/03/2020 2:04 PM  Preanesthetic checklist: patient identified, IV checked, risks and benefits discussed, surgical consent, pre-op evaluation and timeout performed Right, radial was placed Catheter size: 20 G Hand hygiene performed , maximum sterile barriers used  and Seldinger technique used Allen's test indicative of satisfactory collateral circulation Attempts: 1 Procedure performed without using ultrasound guided technique. Following insertion, Biopatch and dressing applied. Post procedure assessment: normal  Patient tolerated the procedure well with no immediate complications.

## 2020-05-03 NOTE — Op Note (Signed)
[facility]   MULTIDISCIPLINARY HEART VALVE TEAM   TAVR OPERATIVE NOTE   Date of Procedure:  05/03/2020  Preoperative Diagnosis: Severe Aortic Stenosis   Postoperative Diagnosis: Same   Procedure:    Transcatheter Aortic Valve Replacement - Left Axillary/Subclavian Artery Approach  Edwards Sapien 3 THV (size 29 mm, model # 9600TFX, serial # [id number])   Co-Surgeons:  Gaye Pollack, MD Lauree Chandler, MD   Anesthesiologist:  Lissa Hoard, MD  Echocardiographer:  Johnsie Cancel, MD  Pre-operative Echo Findings:  Severe aortic stenosis  Moderate left ventricular systolic dysfunction  Post-operative Echo Findings:  Trivial paravalvular leak  Moderate left ventricular systolic dysfunction   BRIEF CLINICAL NOTE AND INDICATIONS FOR SURGERY  The patient is a 75 yo male with a history of atrial flutter s/p ablation, persistent atrial fibrillation on Eliquis, symptomatic bradycardia status post permanent pacemaker placement, chronic combined systolic and diastolic CHF, non-ischemic cardiomyopathy, mitral regurgitation, CKD stage 3, diabetes mellitus, HTN, hyperlipidemia and severe aortic stenosis. He has had multiple admissions for volume overload and decompensated heart failure. He underwent workup for TAVR and was admitted by the heart failure team again last week with volume overload. He was diuresed and started on Milrinone with improvement. Plans were made for TAVR as an inpatient.  His abdominal and pelvic CTA shows bulky calcification at the aorto-iliac bifurcation protruding into the lumen causing significant obstruction of both common iliacs. Therefore alternative access via the left subclavian artery was planned.  During the course of the patient's preoperative work up they have been evaluated comprehensively by a multidisciplinary team of specialists coordinated through the [facility] in the [facility].  They  have been demonstrated to suffer from symptomatic severe aortic stenosis as noted above. The patient has been counseled extensively as to the relative risks and benefits of all options for the treatment of severe aortic stenosis including long term medical therapy, conventional surgery for aortic valve replacement, and transcatheter aortic valve replacement.  All questions have been answered, and the patient provides full informed consent for the operation as described.   DETAILS OF THE OPERATIVE PROCEDURE  PREPARATION:    The patient was brought to the operating room on the above mentioned date and appropriate monitoring was established by the anesthesia team. The patient was placed in the supine position on the operating table.  Intravenous antibiotics were administered. General endotracheal anesthesia was induced uneventfully.    Baseline transesophageal echocardiogram was performed. The patient's neck, chest, abdomen and both groins were prepped and draped in a sterile manner. A time out procedure was performed.   PERIPHERAL ACCESS:    Using the modified Seldinger technique, femoral arterial and venous access was obtained with placement of 6 Fr sheaths on the right side.  A pigtail diagnostic catheter was passed through the right arterial sheath under fluoroscopic guidance into the aortic root.  A temporary transvenous pacemaker catheter was passed through the right femoral venous sheath under fluoroscopic guidance into the right ventricle.  The pacemaker was tested to ensure stable lead placement and pacemaker capture. Aortic root angiography was performed in order to determine the optimal angiographic angle for valve deployment.   LEFT SUBCLAVIAN ACCESS:   A transverse incision was made below the left clavicle and carried down through the subcutaneous tissue using electrocautery. The pectoralis major muscle was split along its fibers and the pectoralis minor muscle retracted laterally. The  left axillary artery was identified and encircled with a vessel loop.  The patient was heparinized systemically and ACT verified > 250 seconds.  A double concentric purse string suture of CV-4 gortex was placed in the anterior wall of the artery. The artery was cannulated with a needle and a J- wire advanced into the ascending aorta. An 8 F sheath was inserted over the wire. The aortic valve was crossed with a JR4 catheter and a straight wire. This was exchanged for a pigtail catheter and position was confirmed in the LV apex. The pigtail catheter was exchanged for an Amplatz Extra-stiff wire in the LV apex.  Then a 28F E-sheath was inserted into the axillary artery and the tip advanced into the aortic arch.  BALLOON AORTIC VALVULOPLASTY:   Not performed.  TRANSCATHETER HEART VALVE DEPLOYMENT:   An Edwards Sapien 3  transcatheter heart valve (size 29 mm) was prepared and crimped per manufacturer's guidelines, and the proper orientation of the valve is confirmed on the [employer] delivery system. The valve was advanced through the introducer sheath using normal technique until in an appropriate position in the ascending aorta beyond the sheath tip. The balloon was then retracted and using the fine-tuning wheel was centered on the valve. The valve was then advanced across the aortic arch using appropriate flexion of the catheter. The valve was carefully positioned across the aortic valve annulus. The Commander catheter was retracted using normal technique. Once final position of the valve has been confirmed by angiographic assessment, the valve is deployed while temporarily holding ventilation and during rapid ventricular pacing to maintain systolic blood pressure < 50 mmHg and pulse pressure < 10 mmHg. The balloon inflation is held for >3 seconds after reaching full deployment volume. Once the balloon has fully deflated the balloon is retracted into the ascending aorta and valve function is assessed  using echocardiography. There is felt to be trivial paravalvular leak and no central aortic insufficiency. Post-procedural gradients were acceptable. The patient's hemodynamic recovery following valve deployment is good.  The deployment balloon and guidewire are both removed.    PROCEDURE COMPLETION:   The sheath was removed and axillary artery closure performed using the Gortex pursestring sutures.  Protamine was administered once axillary arterial repair was complete. The temporary pacemaker, pigtail catheter and femoral sheaths were removed with manual pressure used for hemostasis.  A Mynx femoral closure device was utilized following removal of the diagnostic sheath in the right femoral artery.  The patient tolerated the procedure well and is transported to the PACU in stable condition. There were no immediate intraoperative complications. All sponge instrument and needle counts are verified correct at completion of the operation.   No blood products were administered during the operation.  The patient received a total of 40 mL of intravenous contrast during the procedure.   Gaye Pollack, MD 05/03/2020

## 2020-05-03 NOTE — Anesthesia Preprocedure Evaluation (Addendum)
Anesthesia Evaluation  Patient identified by MRN, date of birth, ID band Patient awake    Reviewed: Allergy & Precautions, H&P , NPO status , Patient's Chart, lab work & pertinent test results  Airway Mallampati: II  TM Distance: >3 FB Neck ROM: Full    Dental  (+) Dental Advisory Given, Teeth Intact, Missing   Pulmonary pneumonia, former smoker,    Pulmonary exam normal breath sounds clear to auscultation       Cardiovascular Exercise Tolerance: Good hypertension, Pt. on home beta blockers and Pt. on medications +CHF  + dysrhythmias + pacemaker  Rhythm:Regular Rate:Normal + Systolic murmurs    Neuro/Psych negative neurological ROS  negative psych ROS   GI/Hepatic negative GI ROS, (+) Hepatitis -  Endo/Other  diabetes  Renal/GU CRFRenal disease     Musculoskeletal  (+) Arthritis ,   Abdominal (+) + obese,   Peds  Hematology negative hematology ROS (+)   Anesthesia Other Findings   Reproductive/Obstetrics negative OB ROS                            Anesthesia Physical  Anesthesia Plan  ASA: IV  Anesthesia Plan: General   Post-op Pain Management:    Induction: Intravenous  PONV Risk Score and Plan: 2 and Ondansetron, Diphenhydramine, Dexamethasone and Treatment may vary due to age or medical condition  Airway Management Planned: Oral ETT  Additional Equipment: Arterial line  Intra-op Plan:   Post-operative Plan: Extubation in OR  Informed Consent: I have reviewed the patients History and Physical, chart, labs and discussed the procedure including the risks, benefits and alternatives for the proposed anesthesia with the patient or authorized representative who has indicated his/her understanding and acceptance.     Dental advisory given  Plan Discussed with: CRNA  Anesthesia Plan Comments:       Anesthesia Quick Evaluation

## 2020-05-03 NOTE — OR Nursing (Signed)
Bilateral pedal pulses palpated prior to procedure

## 2020-05-03 NOTE — Progress Notes (Signed)
Transported to the short stay by bed awake and alert. Report given to Rn.

## 2020-05-03 NOTE — Progress Notes (Signed)
   05/03/20 1945  Vitals  BP (!) 97/49  MAP (mmHg) (!) 64  Pulse Rate (!) 58  ECG Heart Rate 60  Resp 15  MEWS COLOR  MEWS Score Color Yellow  Oxygen Therapy  SpO2 96 %  O2 Device Room Air   MEWS flagged yellow- BP soft but stable and consistent w/ prior BPs. Pt is alert but sleepy, just received pt from PACU at 1900. Frequent vitals are already set-up. Will continue to closely monitor.

## 2020-05-03 NOTE — Interval H&P Note (Signed)
History and Physical Interval Note:  05/03/2020 3:14 PM  VEDH PTACEK  has presented today for surgery, with the diagnosis of Severe Aortic Stenosis.  The various methods of treatment have been discussed with the patient and family. After consideration of risks, benefits and other options for treatment, the patient has consented to  Procedure(s): TRANSCATHETER AORTIC VALVE REPLACEMENT, LEFT SUBCLAVIAN (Left) TRANSESOPHAGEAL ECHOCARDIOGRAM (TEE) (N/A) as a surgical intervention.  The patient's history has been reviewed, patient examined, no change in status, stable for surgery.  I have reviewed the patient's chart and labs.  Questions were answered to the patient's satisfaction.     Lauree Chandler

## 2020-05-03 NOTE — H&P (View-Only) (Signed)
Patient ID: LY BACCHI, male   DOB: 1945/05/31, 75 y.o.   MRN: [id number]    Advanced Heart Failure Rounding Note   Subjective:    CVP down to 11-12. Brisk output 4.6L yesterday.  Cr stable.  Remains on dobutamine 2.5, co ox 64% today.  He feels well, laying flatter than I have seen him in the past and having no shortness of breath.    Objective:   Weight Range:  Vital Signs:   Temp:  [97.6 F (36.4 C)-98.5 F (36.9 C)] 98.4 F (36.9 C) (02/08 0727) Pulse Rate:  [0-65] 0 (02/08 0727) Resp:  [12-19] 15 (02/08 0344) BP: (101-122)/(50-94) 101/57 (02/08 0344) SpO2:  [91 %-98 %] 98 % (02/08 0727) Last BM Date: 04/30/20  Weight change: Filed Weights   04/30/20 0631 05/01/20 0639 05/02/20 0315  Weight: 106.1 kg 106.8 kg 104.6 kg    Intake/Output:   Intake/Output Summary (Last 24 hours) at 05/03/2020 0748 Last data filed at 05/03/2020 2633 Gross per 24 hour  Intake 1655.08 ml  Output 5025 ml  Net -3369.92 ml     Physical Exam: Cardiac: JVD 10cm, normal rate and rhythm, clear s1 and diminished S2, 3/6 AS murmur, no rubs or gallops, no LE edema bilaterally  Pulmonary: CTAB, not in distress Abdominal: non distended abdomen, soft and nontender Skin: Large hematoma along left mid axillary line appears to have regressed slightly in size, no longer tender Psych: Alert, conversant, in good spirits   Telemetry: AF, RV paced.  Personally reviewed  RHC:  RA = 12 RV = 51/11 PA = 52/15 (29) PCW = 29 ( v = 44) Fick cardiac output/index = 4.5/2.0 Thermo CO/CI = 5.2/2.3 PVR = < 1.0 WU Ao sat = 110% PA sat = 64%, 66%  Assessment: 1. Markedly elevated left sided filling pressures with prominent v-waves in PCWP tracing 2. Moderate reduced cardiac index 3. SBPs in 90s with severe AS   Labs: Basic Metabolic Panel: Recent Labs  Lab 04/29/20 0402 04/30/20 0400 05/01/20 0340 05/02/20 0325 05/02/20 1147 05/03/20 0305  NA 132* 131* 133* 130*  --  131*  K 4.5 3.7 3.5  3.3*  --  3.3*  CL 99 98 99 95*  --  95*  CO2 24 22 23 23   --  25  GLUCOSE 120* 122* 139* 240*  --  128*  BUN 19 18 20 20   --  20  CREATININE 1.93* 1.68* 1.67* 1.48*  --  1.58*  CALCIUM 7.9* 7.5* 7.7* 7.8*  --  8.1*  MG  --   --   --   --  2.6* 2.6*    Liver Function Tests: Recent Labs  Lab 04/29/20 0402 05/03/20 0305  AST 33 28  ALT 15 15  ALKPHOS 127* 118  BILITOT 3.9* 4.1*  PROT 5.5* 6.0*  ALBUMIN 2.3* 2.3*   No results for input(s): LIPASE, AMYLASE in the last 168 hours. No results for input(s): AMMONIA in the last 168 hours.  CBC: Recent Labs  Lab 04/30/20 1537 05/01/20 0340 05/01/20 1620 05/02/20 0325 05/03/20 0305  WBC 9.5 8.4 9.5 9.3 8.3  HGB 9.0* 9.0* 9.6* 9.8* 9.9*  HCT 28.0* 26.9* 30.2* 31.2* 30.7*  MCV 95.2 94.1 95.6 96.0 93.9  PLT 105* 110* 116* 128* 142*    Cardiac Enzymes: No results for input(s): CKTOTAL, CKMB, CKMBINDEX, TROPONINI in the last 168 hours.  BNP: BNP (last 3 results) Recent Labs    03/09/20 1544 03/20/20 1253 04/25/20 1157  BNP 1,540.0*  2,072.0* 2,410.2*    ProBNP (last 3 results) No results for input(s): PROBNP in the last 8760 hours.    Other results:  Imaging: No results found.   Medications:     Scheduled Medications: . chlorhexidine  1 application Topical Once  . Chlorhexidine Gluconate Cloth  6 each Topical Daily  . dapagliflozin propanediol  10 mg Oral QAC breakfast  . feeding supplement  237 mL Oral BID BM  . insulin aspart  0-9 Units Subcutaneous TID WC  . insulin aspart  2 Units Subcutaneous TID WC  . insulin glargine  5 Units Subcutaneous QHS  . magnesium sulfate  40 mEq Other To OR  . potassium chloride  80 mEq Other To OR  . sodium chloride flush  10-40 mL Intracatheter Q12H  . sodium chloride flush  3 mL Intravenous Q12H  . sodium chloride flush  3 mL Intravenous Q12H    Infusions: . sodium chloride    . sodium chloride    . cefUROXime (ZINACEF)  IV    . dexmedetomidine    . DOBUTamine  2.5 mcg/kg/min (05/01/20 0540)  . furosemide (LASIX) 200 mg in dextrose 5% 100 mL (20m/mL) infusion 20 mg/hr (05/03/20 0644)  . heparin 30,000 units/NS 1000 mL solution for CELLSAVER    . heparin 1,500 Units/hr (05/03/20 0352)  . norepinephrine (LEVOPHED) Adult infusion    . vancomycin      PRN Medications: sodium chloride, sodium chloride, acetaminophen, diclofenac Sodium, ondansetron (ZOFRAN) IV, sodium chloride flush, sodium chloride flush, sodium chloride flush, temazepam, traMADol   Assessment/Plan:   1. 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 Aortic valve also appears severely calcified and moderate-severely stenosed, suspect AS also contributing to CM. [facility]  - RHC with marginal CO and dobutamine started.  Continue dobutamine 2.5 - CVP down to 11-12 brisk diuresis with lasix gtt at 20, volume status looks much better I think we are getting closer to euvolemia.  We can switch to bolus dosing again this afternoon likely transition to oral torsemide soon - TAVR scheduled for this morning 05/03/20, he is NPO  2. Mitral Regurgitation  - Moderate by TEE 8/21. - Severe by TTE 04/26/20 and consistent with severe functional MR - Hopefully will improve with AVR.   3. Aortic Stenosis  - severe low flow/low gradient aortic stenosis with mean gradient 15.5 mmHg, dimensionless index 0.23, AVA 0.86 cm2  -ECHO 2/1 with similar findings mean gradient 21, AVA 0.87 cm2 -TAVR scheduled for today Tuesday 05/03/20    4. Atrial Flutter/ Chronic Atrial Fibrillation - s/p AFL ablation. -In chronic Afib, rate controlled. -Switched to heparin on 2/3 for TAVR procedure, holding  eliquis - continue holding bb - Heparin gtt restarted 2/5, hgb has remained stable.   5. H/o CHB  - s/p PPM followed by Dr. TLovena Le - ? RV paced CM. He is pacing ~40%. - Will optimize patient's volume and discuss with EP BIV upgrade after TAVR  6. NASH cirrhosis -followed by Dr. CJenetta Downer NAFLD score 2.90 consistent with advanced fibrosis -Elastography ordered but not yet complete -no prior history of bleeding or known varices  7. Stage IIIb CKD -Followed by nephrology. -  Baseline SCr ~1.5-1.8 - Creatinine improved with diuresis.  Follow with diuresis.   8. T2DM -On insulin.Hgba1C 6.8 (12/21) -On farxiga - no further hypoglycemia on current basal, bolus, SSI regimen  9. Chest wall hematoma - Uncertain etiology. No underlying rib fracture, no RP hematoma.  - Hgb stable at 9.9, platelets stable 142k.  - Stable appearing, heparin gtt held briefly 2/4 restarted 2/5    Length of Stay: 8   Katherine Roan MD 05/03/2020, 7:48 AM  Advanced Heart Failure Team Pager [phone] (M-F; [place])  Please contact [facility] for night-coverage after hours (4p -7a ) and weekends on [email or web address]  Patient seen and examined with the above-signed Advanced Practice Provider and/or Housestaff. I personally reviewed laboratory data, imaging studies and relevant notes. I independently examined the patient and formulated the important aspects of the plan. I have edited the note to reflect any of my changes or salient points. I have personally discussed the plan with the patient and/or family.  On dobutamine. Co-ox 65% Diuresed very well overnight on increased lasix gtt. Weight down 5 pounds. CVP 11-12.  Breathing better. No orthopnea or PND. Creatinine stable. Hgb stable  General:  Lying flat in bed . No resp difficulty HEENT: normal Neck: supple. JVP 11 Carotids 2+ bilat; + bruits. No lymphadenopathy or thryomegaly appreciated. Cor: PMI nondisplaced. Regular rate & rhythm. 3/6 AS s2  depressed + chest wall ecchymosis Lungs: clear Abdomen: soft, nontender, nondistended. No hepatosplenomegaly. No bruits or masses. Good bowel sounds. Extremities: no cyanosis, clubbing, rash, edema Neuro: alert & orientedx3, cranial nerves grossly intact. moves all 4 extremities w/o difficulty. Affect pleasant   Hemodynamically improved. He is about as well tuned up as we can get him. Plan TAVR later today. D/w structural team. Supp K   Glori Bickers, MD  10:13 AM

## 2020-05-03 NOTE — Progress Notes (Signed)
Rt radial arterial line d/c'ed; manual pressure held x 10 minutes; 2+ palpable rt radial pulse; level 0; dressed w/gauze and tegaderm

## 2020-05-03 NOTE — Progress Notes (Addendum)
Patient ID: John [name], male   DOB: [birth year], 75 y.o.   MRN: [id number]    Advanced Heart Failure Rounding Note   Subjective:    CVP down to 11-12. Brisk output 4.6L yesterday.  Cr stable.  Remains on dobutamine 2.5, co ox 64% today.  He feels well, laying flatter than I have seen him in the past and having no shortness of breath.    Objective:   Weight Range:  Vital Signs:   Temp:  [97.6 F (36.4 C)-98.5 F (36.9 C)] 98.4 F (36.9 C) (02/08 0727) Pulse Rate:  [0-65] 0 (02/08 0727) Resp:  [12-19] 15 (02/08 0344) BP: (101-122)/(50-94) 101/57 (02/08 0344) SpO2:  [91 %-98 %] 98 % (02/08 0727) Last BM Date: 04/30/20  Weight change: Filed Weights   04/30/20 0631 05/01/20 0639 05/02/20 0315  Weight: 106.1 kg 106.8 kg 104.6 kg    Intake/Output:   Intake/Output Summary (Last 24 hours) at 05/03/2020 0748 Last data filed at 05/03/2020 7628 Gross per 24 hour  Intake 1655.08 ml  Output 5025 ml  Net -3369.92 ml     Physical Exam: Cardiac: JVD 10cm, normal rate and rhythm, clear s1 and diminished S2, 3/6 AS murmur, no rubs or gallops, no LE edema bilaterally  Pulmonary: CTAB, not in distress Abdominal: non distended abdomen, soft and nontender Skin: Large hematoma along left mid axillary line appears to have regressed slightly in size, no longer tender Psych: Alert, conversant, in good spirits   Telemetry: AF, RV paced.  Personally reviewed  RHC:  RA = 12 RV = 51/11 PA = 52/15 (29) PCW = 29 ( v = 44) Fick cardiac output/index = 4.5/2.0 Thermo CO/CI = 5.2/2.3 PVR = < 1.0 WU Ao sat = 110% PA sat = 64%, 66%  Assessment: 1. Markedly elevated left sided filling pressures with prominent v-waves in PCWP tracing 2. Moderate reduced cardiac index 3. SBPs in 90s with severe AS   Labs: Basic Metabolic Panel: Recent Labs  Lab 04/29/20 0402 04/30/20 0400 05/01/20 0340 05/02/20 0325 05/02/20 1147 05/03/20 0305  NA 132* 131* 133* 130*  --  131*  K 4.5 3.7 3.5  3.3*  --  3.3*  CL 99 98 99 95*  --  95*  CO2 24 22 23 23   --  25  GLUCOSE 120* 122* 139* 240*  --  128*  BUN 19 18 20 20   --  20  CREATININE 1.93* 1.68* 1.67* 1.48*  --  1.58*  CALCIUM 7.9* 7.5* 7.7* 7.8*  --  8.1*  MG  --   --   --   --  2.6* 2.6*    Liver Function Tests: Recent Labs  Lab 04/29/20 0402 05/03/20 0305  AST 33 28  ALT 15 15  ALKPHOS 127* 118  BILITOT 3.9* 4.1*  PROT 5.5* 6.0*  ALBUMIN 2.3* 2.3*   No results for input(s): LIPASE, AMYLASE in the last 168 hours. No results for input(s): AMMONIA in the last 168 hours.  CBC: Recent Labs  Lab 04/30/20 1537 05/01/20 0340 05/01/20 1620 05/02/20 0325 05/03/20 0305  WBC 9.5 8.4 9.5 9.3 8.3  HGB 9.0* 9.0* 9.6* 9.8* 9.9*  HCT 28.0* 26.9* 30.2* 31.2* 30.7*  MCV 95.2 94.1 95.6 96.0 93.9  PLT 105* 110* 116* 128* 142*    Cardiac Enzymes: No results for input(s): CKTOTAL, CKMB, CKMBINDEX, TROPONINI in the last 168 hours.  BNP: BNP (last 3 results) Recent Labs    03/09/20 1544 03/20/20 1253 04/25/20 1157  BNP 1,540.0*  2,072.0* 2,410.2*    ProBNP (last 3 results) No results for input(s): PROBNP in the last 8760 hours.    Other results:  Imaging: No results found.   Medications:     Scheduled Medications: . chlorhexidine  1 application Topical Once  . Chlorhexidine Gluconate Cloth  6 each Topical Daily  . dapagliflozin propanediol  10 mg Oral QAC breakfast  . feeding supplement  237 mL Oral BID BM  . insulin aspart  0-9 Units Subcutaneous TID WC  . insulin aspart  2 Units Subcutaneous TID WC  . insulin glargine  5 Units Subcutaneous QHS  . magnesium sulfate  40 mEq Other To OR  . potassium chloride  80 mEq Other To OR  . sodium chloride flush  10-40 mL Intracatheter Q12H  . sodium chloride flush  3 mL Intravenous Q12H  . sodium chloride flush  3 mL Intravenous Q12H    Infusions: . sodium chloride    . sodium chloride    . cefUROXime (ZINACEF)  IV    . dexmedetomidine    . DOBUTamine  2.5 mcg/kg/min (05/01/20 0540)  . furosemide (LASIX) 200 mg in dextrose 5% 100 mL (34m/mL) infusion 20 mg/hr (05/03/20 0644)  . heparin 30,000 units/NS 1000 mL solution for CELLSAVER    . heparin 1,500 Units/hr (05/03/20 0352)  . norepinephrine (LEVOPHED) Adult infusion    . vancomycin      PRN Medications: sodium chloride, sodium chloride, acetaminophen, diclofenac Sodium, ondansetron (ZOFRAN) IV, sodium chloride flush, sodium chloride flush, sodium chloride flush, temazepam, traMADol   Assessment/Plan:   1. 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 Aortic valve also appears severely calcified and moderate-severely stenosed, suspect AS also contributing to CM. [facility]  - RHC with marginal CO and dobutamine started.  Continue dobutamine 2.5 - CVP down to 11-12 brisk diuresis with lasix gtt at 20, volume status looks much better I think we are getting closer to euvolemia.  We can switch to bolus dosing again this afternoon likely transition to oral torsemide soon - TAVR scheduled for this morning 05/03/20, he is NPO  2. Mitral Regurgitation  - Moderate by TEE 8/21. - Severe by TTE 04/26/20 and consistent with severe functional MR - Hopefully will improve with AVR.   3. Aortic Stenosis  - severe low flow/low gradient aortic stenosis with mean gradient 15.5 mmHg, dimensionless index 0.23, AVA 0.86 cm2  -ECHO 2/1 with similar findings mean gradient 21, AVA 0.87 cm2 -TAVR scheduled for today Tuesday 05/03/20    4. Atrial Flutter/ Chronic Atrial Fibrillation - s/p AFL ablation. -In chronic Afib, rate controlled. -Switched to heparin on 2/3 for TAVR procedure, holding  eliquis - continue holding bb - Heparin gtt restarted 2/5, hgb has remained stable.   5. H/o CHB  - s/p PPM followed by Dr. TLovena Le - ? RV paced CM. He is pacing ~40%. - Will optimize patient's volume and discuss with EP BIV upgrade after TAVR  6. NASH cirrhosis -followed by Dr. CJenetta Downer NAFLD score 2.90 consistent with advanced fibrosis -Elastography ordered but not yet complete -no prior history of bleeding or known varices  7. Stage IIIb CKD -Followed by nephrology. -  Baseline SCr ~1.5-1.8 - Creatinine improved with diuresis.  Follow with diuresis.   8. T2DM -On insulin.Hgba1C 6.8 (12/21) -On farxiga - no further hypoglycemia on current basal, bolus, SSI regimen  9. Chest wall hematoma - Uncertain etiology. No underlying rib fracture, no RP hematoma.  - Hgb stable at 9.9, platelets stable 142k.  - Stable appearing, heparin gtt held briefly 2/4 restarted 2/5    Length of Stay: 8   Katherine Roan MD 05/03/2020, 7:48 AM  Advanced Heart Failure Team Pager [phone] (M-F; [place])  Please contact [facility] for night-coverage after hours (4p -7a ) and weekends on [email or web address]  Patient seen and examined with the above-signed Advanced Practice Provider and/or Housestaff. I personally reviewed laboratory data, imaging studies and relevant notes. I independently examined the patient and formulated the important aspects of the plan. I have edited the note to reflect any of my changes or salient points. I have personally discussed the plan with the patient and/or family.  On dobutamine. Co-ox 65% Diuresed very well overnight on increased lasix gtt. Weight down 5 pounds. CVP 11-12.  Breathing better. No orthopnea or PND. Creatinine stable. Hgb stable  General:  Lying flat in bed . No resp difficulty HEENT: normal Neck: supple. JVP 11 Carotids 2+ bilat; + bruits. No lymphadenopathy or thryomegaly appreciated. Cor: PMI nondisplaced. Regular rate & rhythm. 3/6 AS s2  depressed + chest wall ecchymosis Lungs: clear Abdomen: soft, nontender, nondistended. No hepatosplenomegaly. No bruits or masses. Good bowel sounds. Extremities: no cyanosis, clubbing, rash, edema Neuro: alert & orientedx3, cranial nerves grossly intact. moves all 4 extremities w/o difficulty. Affect pleasant   Hemodynamically improved. He is about as well tuned up as we can get him. Plan TAVR later today. D/w structural team. Supp K   Glori Bickers, MD  10:13 AM

## 2020-05-03 NOTE — Anesthesia Procedure Notes (Signed)
Procedure Name: Intubation Date/Time: 05/03/2020 3:36 PM Performed by: Moshe Salisbury, CRNA Pre-anesthesia Checklist: Patient identified, Emergency Drugs available, Suction available and Patient being monitored Patient Re-evaluated:Patient Re-evaluated prior to induction Oxygen Delivery Method: Circle System Utilized Preoxygenation: Pre-oxygenation with 100% oxygen Induction Type: IV induction Ventilation: Mask ventilation without difficulty Laryngoscope Size: Mac and 4 Grade View: Grade III Tube type: Oral Tube size: 8.0 mm Number of attempts: 1 Airway Equipment and Method: Stylet Placement Confirmation: ETT inserted through vocal cords under direct vision,  positive ETCO2 and breath sounds checked- equal and bilateral Secured at: 23 cm Tube secured with: Tape Dental Injury: Teeth and Oropharynx as per pre-operative assessment

## 2020-05-04 ENCOUNTER — Inpatient Hospital Stay (HOSPITAL_COMMUNITY): Payer: Medicare HMO

## 2020-05-04 ENCOUNTER — Encounter (HOSPITAL_COMMUNITY): Payer: Self-pay | Admitting: Cardiovascular Disease

## 2020-05-04 DIAGNOSIS — Z952 Presence of prosthetic heart valve: Secondary | ICD-10-CM | POA: Diagnosis not present

## 2020-05-04 DIAGNOSIS — Z20822 Contact with and (suspected) exposure to covid-19: Secondary | ICD-10-CM | POA: Diagnosis not present

## 2020-05-04 DIAGNOSIS — Z954 Presence of other heart-valve replacement: Secondary | ICD-10-CM

## 2020-05-04 DIAGNOSIS — I5043 Acute on chronic combined systolic (congestive) and diastolic (congestive) heart failure: Secondary | ICD-10-CM | POA: Diagnosis not present

## 2020-05-04 DIAGNOSIS — Z006 Encounter for examination for normal comparison and control in clinical research program: Secondary | ICD-10-CM | POA: Diagnosis not present

## 2020-05-04 DIAGNOSIS — I350 Nonrheumatic aortic (valve) stenosis: Secondary | ICD-10-CM

## 2020-05-04 DIAGNOSIS — I130 Hypertensive heart and chronic kidney disease with heart failure and stage 1 through stage 4 chronic kidney disease, or unspecified chronic kidney disease: Secondary | ICD-10-CM | POA: Diagnosis not present

## 2020-05-04 LAB — ECHOCARDIOGRAM LIMITED
AR max vel: 2.54 cm2
AV Area VTI: 2.42 cm2
AV Area mean vel: 2.77 cm2
AV Mean grad: 8.7 mmHg
AV Peak grad: 16.9 mmHg
Ao pk vel: 2.06 m/s
Area-P 1/2: 5.84 cm2
Height: 72 in
MV M vel: 3.9 m/s
MV Peak grad: 60.8 mmHg
Radius: 0.5 cm
S' Lateral: 4.2 cm
Weight: 3516.8 oz

## 2020-05-04 LAB — BASIC METABOLIC PANEL
Anion gap: 10 (ref 5–15)
BUN: 20 mg/dL (ref 8–23)
CO2: 26 mmol/L (ref 22–32)
Calcium: 8.1 mg/dL — ABNORMAL LOW (ref 8.9–10.3)
Chloride: 98 mmol/L (ref 98–111)
Creatinine, Ser: 1.44 mg/dL — ABNORMAL HIGH (ref 0.61–1.24)
GFR, Estimated: 51 mL/min — ABNORMAL LOW (ref >60)
Glucose, Bld: 101 mg/dL — ABNORMAL HIGH (ref 70–99)
Potassium: 3.3 mmol/L — ABNORMAL LOW (ref 3.5–5.1)
Sodium: 134 mmol/L — ABNORMAL LOW (ref 135–145)

## 2020-05-04 LAB — GLUCOSE, CAPILLARY
Glucose-Capillary: 96 mg/dL (ref 70–99)
Glucose-Capillary: 171 mg/dL — ABNORMAL HIGH (ref 70–99)
Glucose-Capillary: 208 mg/dL — ABNORMAL HIGH (ref 70–99)
Glucose-Capillary: 196 mg/dL — ABNORMAL HIGH (ref 70–99)

## 2020-05-04 LAB — CBC
HCT: 30.3 % — ABNORMAL LOW (ref 39.0–52.0)
Hemoglobin: 9.7 g/dL — ABNORMAL LOW (ref 13.0–17.0)
MCH: 30.3 pg (ref 26.0–34.0)
MCHC: 32 g/dL (ref 30.0–36.0)
MCV: 94.7 fL (ref 80.0–100.0)
Platelets: 122 10*3/uL — ABNORMAL LOW (ref 150–400)
RBC: 3.2 MIL/uL — ABNORMAL LOW (ref 4.22–5.81)
RDW: 18.1 % — ABNORMAL HIGH (ref 11.5–15.5)
WBC: 8 10*3/uL (ref 4.0–10.5)
nRBC: 0 % (ref 0.0–0.2)

## 2020-05-04 LAB — COOXEMETRY PANEL
Carboxyhemoglobin: 3.1 % — ABNORMAL HIGH (ref 0.5–1.5)
Methemoglobin: 1 % (ref 0.0–1.5)
O2 Saturation: 69.2 %
Total hemoglobin: 9.8 g/dL — ABNORMAL LOW (ref 12.0–16.0)

## 2020-05-04 LAB — HEPARIN LEVEL (UNFRACTIONATED)
Heparin Unfractionated: 0.14 IU/mL — ABNORMAL LOW (ref 0.30–0.70)
Heparin Unfractionated: 0.28 IU/mL — ABNORMAL LOW (ref 0.30–0.70)

## 2020-05-04 LAB — MAGNESIUM: Magnesium: 2.5 mg/dL — ABNORMAL HIGH (ref 1.7–2.4)

## 2020-05-04 MED ORDER — ENSURE ENLIVE PO LIQD
237.0000 mL | Freq: Three times a day (TID) | ORAL | Status: DC
  Administered 2020-05-05 – 2020-05-15 (×26): 237 mL via ORAL

## 2020-05-04 MED ORDER — POTASSIUM CHLORIDE CRYS ER 20 MEQ PO TBCR
40.0000 meq | EXTENDED_RELEASE_TABLET | ORAL | Status: AC
  Administered 2020-05-04 (×2): 40 meq via ORAL
  Filled 2020-05-04 (×2): qty 2

## 2020-05-04 MED ORDER — FUROSEMIDE 10 MG/ML IJ SOLN
80.0000 mg | Freq: Two times a day (BID) | INTRAMUSCULAR | Status: DC
  Administered 2020-05-04: 80 mg via INTRAVENOUS
  Filled 2020-05-04: qty 8

## 2020-05-04 MED FILL — Magnesium Sulfate Inj 50%: INTRAMUSCULAR | Qty: 10 | Status: AC

## 2020-05-04 MED FILL — Heparin Sodium (Porcine) Inj 1000 Unit/ML: INTRAMUSCULAR | Qty: 30 | Status: AC

## 2020-05-04 MED FILL — Potassium Chloride Inj 2 mEq/ML: INTRAVENOUS | Qty: 40 | Status: AC

## 2020-05-04 NOTE — Progress Notes (Signed)
Mobility Specialist: Progress Note   05/04/20 1148  Mobility  Activity Ambulated in room  Level of Assistance Moderate assist, patient does 50-74%  Assistive Device Front wheel walker  Distance Ambulated (ft) 40 ft  Mobility Response Tolerated well  Mobility performed by Mobility specialist  Bed Position Chair  $Mobility charge 1 Mobility   Pre-Mobility: 65 HR, 114/43 BP, 94% SpO2 Post-Mobility: 81 HR, 132/52 BP, 100% SpO2  Pt c/o pain in his L arm and L knee during ambulation he rated 8/10. Pt walked to the door and back 2x. Pt otherwise asx. Pt back to chair after walk.   [facility] Mikie Misner Mobility Specialist Mobility Specialist Phone: [phone]

## 2020-05-04 NOTE — Progress Notes (Signed)
1 Day Post-Op Procedure(s) (LRB): TRANSCATHETER AORTIC VALVE REPLACEMENT, LEFT SUBCLAVIAN (Left) TRANSESOPHAGEAL ECHOCARDIOGRAM (TEE) (N/A) Subjective: Only complaint is of pain in left arm with movement of shoulder.  Ambulated this am.  Objective: Vital signs in last 24 hours: Temp:  [97.4 F (36.3 C)-98.6 F (37 C)] 97.8 F (36.6 C) (02/09 0300) Pulse Rate:  [57-92] 64 (02/09 0600) Cardiac Rhythm: Ventricular paced (02/08 1919) Resp:  [12-22] 18 (02/09 0600) BP: (91-120)/(40-79) 104/53 (02/09 0600) SpO2:  [87 %-100 %] 98 % (02/09 0600) Arterial Line BP: (131-150)/(37-45) 147/42 (02/08 1805) Weight:  [99.7 kg] 99.7 kg (02/09 0600)  Hemodynamic parameters for last 24 hours: CVP:  [12 mmHg-15 mmHg] 12 mmHg  Intake/Output from previous day: 02/08 0701 - 02/09 0700 In: 2203.7 [P.O.:360; I.V.:1343.7; IV Piggyback:500] Out: 930 [Urine:930] Intake/Output this shift: No intake/output data recorded.  General appearance: alert and cooperative Neurologic: intact Heart: regular rate and rhythm, S1, S2 normal, no murmur Lungs: clear to auscultation bilaterally Extremities: extremities normal, atraumatic, no cyanosis or edema. Strong palpable left radial pulse. Wound: left subclavian access incision ok. mild swelling.  Lab Results: Recent Labs    05/03/20 0305 05/03/20 1549 05/03/20 1810 05/04/20 0210  WBC 8.3  --   --  8.0  HGB 9.9*   < > 10.9* 9.7*  HCT 30.7*   < > 32.0* 30.3*  PLT 142*  --   --  122*   < > = values in this interval not displayed.   BMET:  Recent Labs    05/03/20 0305 05/03/20 1549 05/03/20 1810 05/04/20 0210  NA 131*   < > 136 134*  K 3.3*   < > 3.3* 3.3*  CL 95*   < > 97* 98  CO2 25  --   --  26  GLUCOSE 128*   < > 118* 101*  BUN 20   < > 22 20  CREATININE 1.58*   < > 1.40* 1.44*  CALCIUM 8.1*  --   --  8.1*   < > = values in this interval not displayed.    PT/INR:  Recent Labs    05/02/20 1450  LABPROT 16.2*  INR 1.4*   ABG     Component Value Date/Time   PHART 7.484 (H) 05/03/2020 0335   HCO3 26.0 05/03/2020 0335   TCO2 27 05/03/2020 1810   O2SAT 69.2 05/04/2020 0220   CBG (last 3)  Recent Labs    05/03/20 1514 05/03/20 2120 05/04/20 0605  GLUCAP 115* 112* 96    Assessment/Plan: S/P Procedure(s) (LRB): TRANSCATHETER AORTIC VALVE REPLACEMENT, LEFT SUBCLAVIAN (Left) TRANSESOPHAGEAL ECHOCARDIOGRAM (TEE) (N/A)  POD 1 Hemodynamically stable on dobut 2.5 with Co-ox 69. Rhythm is V-paced. He is back on heparin since last night. I think incision looks ok. Mild swelling but no definite hematoma. 2D echo today for routine followup of valve. Plans per heart failure team.   LOS: 9 days    Gaye Pollack 05/04/2020

## 2020-05-04 NOTE — Progress Notes (Signed)
  Echocardiogram 2D Echocardiogram limited post TAVR has been performed.  John [name] M 05/04/2020, 12:46 PM

## 2020-05-04 NOTE — Progress Notes (Signed)
Assisted from bed to chair this AM, pt states he felt a little too wobbly to walk in halls. Tolerating chair well. Ordered breakfast. Will continue to monitor.

## 2020-05-04 NOTE — Progress Notes (Signed)
Physical Therapy Treatment Patient Details Name: John [name] MRN: [id number] DOB: June 10, 1945 Today's Date: 05/04/2020    History of Present Illness Pt admit with chronic heart failure and Afib. Pt work up for TAVR.    PT Comments    Pt received up in chair, agreeable to therapy session, wife present and supportive. Pt making good progress toward mobility goals this date, able to progress gait distance to ~275f with RW and min guard assist. He did have increased difficulty with sit>stand transfer this date and would benefit from further serial transfer training/sit to stands next session vs stair training for BLE strengthening. Pt continues to benefit from PT services to progress toward functional mobility goals. Continue to recommend HHPT.   Follow Up Recommendations  Home health PT;Supervision/Assistance - 24 hour     Equipment Recommendations  None recommended by PT    Recommendations for Other Services       Precautions / Restrictions Precautions Precautions: Fall Restrictions Weight Bearing Restrictions: No    Mobility  Bed Mobility Overal bed mobility: Needs Assistance Bed Mobility: Sit to Sidelying;Rolling Rolling: Supervision       Sit to sidelying: Min assist General bed mobility comments: cues for log roll due to lines/leads and minA to get BLE over EOB  Transfers Overall transfer level: Needs assistance Equipment used: Rolling walker (2 wheeled) Transfers: Sit to/from Stand Sit to Stand: Max assist         General transfer comment: pt attempted from low chair with minA but unable to reach upright; needed maxA to reach full upright stance at RW and modA for stability once upright  Ambulation/Gait Ambulation/Gait assistance: Min guard Gait Distance (Feet): 200 Feet (including standing break) Assistive device: Rolling walker (2 wheeled) Gait Pattern/deviations: Step-through pattern;Decreased stride length;Drifts right/left (downward gaze) Gait  velocity: decreased   General Gait Details: cues for proximity to RW at times and forward gaze, pt tending to look down throughout; HR 60-70's bpm during gait task, needs some navigational cues in tight spaces in room   Stairs             Wheelchair Mobility    Modified Rankin (Stroke Patients Only)       Balance Overall balance assessment: Needs assistance Sitting-balance support: No upper extremity supported;Feet supported Sitting balance-Leahy Scale: Fair     Standing balance support: No upper extremity supported Standing balance-Leahy Scale: Poor Standing balance comment: initial LOB standing at RW and needs modA to stabilize, but otherwise mostly min guard during dynamic standing tasks and remains reliant on BUE support of RW                            Cognition Arousal/Alertness: Awake/alert Behavior During Therapy: WFL for tasks assessed/performed Overall Cognitive Status: Within Functional Limits for tasks assessed                                 General Comments: pleasantly cooperative      Exercises General Exercises - Lower Extremity Ankle Circles/Pumps: AROM;Both;10 reps;Supine    General Comments General comments (skin integrity, edema, etc.): 2+ pitting edema B LE/knees, encouraged BLE elevated and ankle pumps with heels floated for pressure relief, end of bed elevated once back in bed      Pertinent Vitals/Pain Pain Assessment: No/denies pain Pain Intervention(s): Monitored during session;Repositioned    Home Living  Prior Function            PT Goals (current goals can now be found in the care plan section) Acute Rehab PT Goals Patient Stated Goal: home PT Goal Formulation: With patient/family Time For Goal Achievement: 05/12/20 Potential to Achieve Goals: Good Progress towards PT goals: Progressing toward goals    Frequency    Min 3X/week      PT Plan Current plan remains  appropriate    Co-evaluation              AM-PAC PT "6 Clicks" Mobility   Outcome Measure  Help needed turning from your back to your side while in a flat bed without using bedrails?: None Help needed moving from lying on your back to sitting on the side of a flat bed without using bedrails?: None Help needed moving to and from a bed to a chair (including a wheelchair)?: A Little Help needed standing up from a chair using your arms (e.g., wheelchair or bedside chair)?: A Lot Help needed to walk in hospital room?: A Little Help needed climbing 3-5 steps with a railing? : A Little 6 Click Score: 19    End of Session Equipment Utilized During Treatment: Gait belt Activity Tolerance: Patient tolerated treatment well Patient left: with call bell/phone within reach;with family/visitor present;in bed;with bed alarm set Nurse Communication: Mobility status PT Visit Diagnosis: Muscle weakness (generalized) (M62.81);Unsteadiness on feet (R26.81)     Time: 3419-3790 PT Time Calculation (min) (ACUTE ONLY): 27 min  Charges:  $Gait Training: 8-22 mins $Therapeutic Activity: 8-22 mins                     Machael Raine P., PTA Acute Rehabilitation Services Pager: [phone] Office: [place] 05/04/2020, 5:49 PM

## 2020-05-04 NOTE — Progress Notes (Signed)
Nutrition Follow Up  DOCUMENTATION CODES:   Obesity unspecified  INTERVENTION:    Increase Ensure Enlive po TID, each supplement provides 350 kcal and 20 grams of protein  MVI daily  NUTRITION DIAGNOSIS:   Moderate Malnutrition related to chronic illness (CHF, NASH cirrhosis) as evidenced by mild fat depletion,moderate fat depletion,mild muscle depletion,moderate muscle depletion.  Ongoing  GOAL:   Patient will meet greater than or equal to 90% of their needs   Progressing   MONITOR:   PO intake,Supplement acceptance,Labs,Weight trends,I & O's  REASON FOR ASSESSMENT:   Consult Assessment of nutrition requirement/status  ASSESSMENT:   75 year old male who presented on 1/31 with worsening SOB and weight gain. PMH of CHF, T2DM, atrial fibrillation, CHB s/p PPM, NASH cirrhosis, CKD stage III, HTN, HLD. Admitted with acute on chronic combined systolic and diastolic heart failure.   2/03 - RHC 2/08 - TAVR  Appetite slow to progress post-op. Patient states she takes a few bites from each meal tray. Last eight meal completions charted as 50-100%. Taking two Ensures daily. Discussed the importance of protein intake to promote post op healing. Patient willing to increase Ensure.   Admission weight: 106.7 kg  Current weight: 99.7 kg   UOP: 930 ml x 24 hrs   Medications: 80 mg lasix BID, SS novolog, lantus Labs: Na 134 (L) K 3.3 (L) Mg 2.5 (H) CBG 96-240  Diet Order:   Diet Order            Diet heart healthy/carb modified Room service appropriate? Yes with Assist; Fluid consistency: Thin  Diet effective now                 EDUCATION NEEDS:   Education needs have been addressed  Skin:  Skin Assessment: Skin Integrity Issues: Skin Integrity Issues:: Incisions Incisions: chest, bilateral groin  Last BM:  2/5  Height:   Ht Readings from Last 1 Encounters:  05/03/20 6' (1.829 m)    Weight:   Wt Readings from Last 1 Encounters:  05/04/20 99.7 kg     BMI:  Body mass index is 29.81 kg/m.  Estimated Nutritional Needs:   Kcal:  2000-2200  Protein:  100-120 grams  Fluid:  1.8 L/day  Mariana Single RD, LDN Clinical Nutrition Pager listed in [place]

## 2020-05-04 NOTE — Care Management Important Message (Signed)
Important Message  Patient Details  Name: John [name] MRN: [id number] Date of Birth: December 10, 1945   Medicare Important Message Given:  Yes     Shelda Altes 05/04/2020, 11:29 AM

## 2020-05-04 NOTE — Progress Notes (Addendum)
ANTICOAGULATION CONSULT NOTE - Follow Up Consult  Pharmacy Consult for heparin  Indication: atrial fibrillation  No Known Allergies  Patient Measurements: Height: 6' (182.9 cm) Weight: 99.7 kg (219 lb 12.8 oz) (STANDING, scale A) IBW/kg (Calculated) : 77.6 Heparin Dosing Weight: 100kg   Vital Signs: Temp: 97.6 F (36.4 C) (02/09 0816) Temp Source: Oral (02/09 0816) BP: 115/51 (02/09 0816) Pulse Rate: 64 (02/09 0816)  Labs: Recent Labs    05/02/20 0325 05/02/20 1007 05/02/20 1450 05/02/20 1940 05/03/20 0305 05/03/20 1549 05/03/20 1637 05/03/20 1810 05/04/20 0210 05/04/20 1028  HGB 9.8*  --   --   --  9.9*   < > 10.9* 10.9* 9.7*  --   HCT 31.2*  --   --   --  30.7*   < > 32.0* 32.0* 30.3*  --   PLT 128*  --   --   --  142*  --   --   --  122*  --   APTT 65*  --   --   --   --   --   --   --   --   --   LABPROT  --   --  16.2*  --   --   --   --   --   --   --   INR  --   --  1.4*  --   --   --   --   --   --   --   HEPARINUNFRC 0.18*   < >  --    < > 0.28*  --   --   --  0.14* 0.28*  CREATININE 1.48*  --   --   --  1.58*   < > 1.40* 1.40* 1.44*  --    < > = values in this interval not displayed.    Estimated Creatinine Clearance: 55 mL/min (A) (by C-G formula based on SCr of 1.44 mg/dL (H)).   Assessment: 74yom with Afib on apixaban.  EF 30% diuresed and volume status stable.  Plan for TAVR this week.  Will hold apixaban for upcoming procedure and bridge with heparin. Last dose apixaban 2/3 am.   Heparin level this morning came back just below goal at 0.28 on recheck at current rate of 1500 units/hr. No s/sx of bleeding or infusion issues per RN.   S/p TAVR 2/8, discussed with cardiology this afternoon. Will decrease heparin to 600 units/hr to avoid bleeding and plan on restarting apixaban tomorrow.   Goal of Therapy:  Heparin level 0.3-0.7 units/ml  Monitor platelets by anticoagulation protocol: Yes   Plan:  Heparin at 600 units/hr - no rate adjustments  overnight  Erin Hearing PharmD., BCPS Clinical Pharmacist 05/04/2020 12:31 PM

## 2020-05-04 NOTE — Progress Notes (Addendum)
Patient ID: John [name], male   DOB: [birth year], 75 y.o.   MRN: [id number]    Advanced Heart Failure Rounding Note   Subjective:    CVP remains 12. Given a break from diuresis yesterday during procedure.  Cr stable.  Remains on dobutamine 2.5, co ox improved to 69% today post TAVR.     Intraop TEE:  Pre TAVR severe AS with tri leaflet AV no AR men gradient 28 mmHg peak 49 mmHg  in  setting of moderately reduced EF AVA 0.9 cm2.   Post TAVR: well positioned 29 mm Sapien 3 valve Trivial PVL seen around  1:00 on  SA images mean gradient 5 peak 10 mmHg AVA 2.3 cm2.  EF improved 35-40% post procedure   Objective:   Weight Range:  Vital Signs:   Temp:  [97.4 F (36.3 C)-98.6 F (37 C)] 97.8 F (36.6 C) (02/09 0300) Pulse Rate:  [57-92] 64 (02/09 0600) Resp:  [12-22] 18 (02/09 0600) BP: (91-120)/(40-79) 104/53 (02/09 0600) SpO2:  [87 %-100 %] 98 % (02/09 0600) Arterial Line BP: (131-150)/(37-45) 147/42 (02/08 1805) Weight:  [99.7 kg] 99.7 kg (02/09 0600) Last BM Date: 04/30/20  Weight change: Filed Weights   05/01/20 0639 05/02/20 0315 05/04/20 0600  Weight: 106.8 kg 104.6 kg 99.7 kg    Intake/Output:   Intake/Output Summary (Last 24 hours) at 05/04/2020 0744 Last data filed at 05/04/2020 1191 Gross per 24 hour  Intake 2203.67 ml  Output 530 ml  Net 1673.67 ml     Physical Exam: Cardiac: JVD 10cm, normal rate and rhythm, clear s1 and S2,  no rubs or gallops, no LE edema bilaterally  Pulmonary: no rhonci, normal RR, not in distress Abdominal: non distended abdomen, soft and nontender Skin: Large hematoma along left mid axillary line stable in size, no longer tender Psych: Alert, conversant, in good spirits   Telemetry: AF, RV paced.  Personally reviewed  RHC:  RA = 12 RV = 51/11 PA = 52/15 (29) PCW = 29 ( v = 44) Fick cardiac output/index = 4.5/2.0 Thermo CO/CI = 5.2/2.3 PVR = < 1.0 WU Ao sat = 110% PA sat = 64%, 66%  Assessment: 1. Markedly elevated  left sided filling pressures with prominent v-waves in PCWP tracing 2. Moderate reduced cardiac index 3. SBPs in 90s with severe AS   Labs: Basic Metabolic Panel: Recent Labs  Lab 04/30/20 0400 05/01/20 0340 05/02/20 0325 05/02/20 1147 05/03/20 0305 05/03/20 1549 05/03/20 1637 05/03/20 1810 05/04/20 0210  NA 131* 133* 130*  --  131* 136 135 136 134*  K 3.7 3.5 3.3*  --  3.3* 3.4* 3.3* 3.3* 3.3*  CL 98 99 95*  --  95* 94* 95* 97* 98  CO2 22 23 23   --  25  --   --   --  26  GLUCOSE 122* 139* 240*  --  128* 118* 116* 118* 101*  BUN 18 20 20   --  20 22 21 22 20   CREATININE 1.68* 1.67* 1.48*  --  1.58* 1.40* 1.40* 1.40* 1.44*  CALCIUM 7.5* 7.7* 7.8*  --  8.1*  --   --   --  8.1*  MG  --   --   --  2.6* 2.6*  --   --   --  2.5*    Liver Function Tests: Recent Labs  Lab 04/29/20 0402 05/03/20 0305  AST 33 28  ALT 15 15  ALKPHOS 127* 118  BILITOT 3.9* 4.1*  PROT 5.5*  6.0*  ALBUMIN 2.3* 2.3*   No results for input(s): LIPASE, AMYLASE in the last 168 hours. No results for input(s): AMMONIA in the last 168 hours.  CBC: Recent Labs  Lab 05/01/20 0340 05/01/20 1620 05/02/20 0325 05/03/20 0305 05/03/20 1549 05/03/20 1637 05/03/20 1810 05/04/20 0210  WBC 8.4 9.5 9.3 8.3  --   --   --  8.0  HGB 9.0* 9.6* 9.8* 9.9* 11.6* 10.9* 10.9* 9.7*  HCT 26.9* 30.2* 31.2* 30.7* 34.0* 32.0* 32.0* 30.3*  MCV 94.1 95.6 96.0 93.9  --   --   --  94.7  PLT 110* 116* 128* 142*  --   --   --  122*    Cardiac Enzymes: No results for input(s): CKTOTAL, CKMB, CKMBINDEX, TROPONINI in the last 168 hours.  BNP: BNP (last 3 results) Recent Labs    03/09/20 1544 03/20/20 1253 04/25/20 1157  BNP 1,540.0* 2,072.0* 2,410.2*    ProBNP (last 3 results) No results for input(s): PROBNP in the last 8760 hours.    Other results:  Imaging: DG Chest Port 1 View  Result Date: 05/03/2020 CLINICAL DATA:  Post TAVR EXAM: PORTABLE CHEST 1 VIEW COMPARISON:  04/25/2020 FINDINGS: Left pacer  remains in place, unchanged. Changes of TAVR. Right PICC line in place with the tip at the cavoatrial junction. Cardiomegaly. Vascular congestion and bilateral interstitial and airspace opacities, likely edema. Small bilateral effusions. No pneumothorax. IMPRESSION: Changes of TAVR. Cardiomegaly.  Mild CHF. Small bilateral effusions. Electronically Signed   By: Rolm Baptise M.D.   On: 05/03/2020 20:22   ECHO INTRAOPERATIVE TEE  Result Date: 05/03/2020  *INTRAOPERATIVE TRANSESOPHAGEAL REPORT *  Patient Name:   John [name] Date of Exam: 05/03/2020 Medical Rec #:  [id number]        Height:       72.0 in Accession #:    [id number]       Weight:       230.6 lb Date of Birth:  12-09-1945        BSA:          2.26 m Patient Age:    50 years         BP:           91/67 mmHg Patient Gender: M                HR:           81 bpm. Exam Location:  Inpatient Transesophogeal exam was perform intraoperatively during surgical procedure. Patient was closely monitored under general anesthesia during the entirety of examination. Indications:     I35.0 Nonrheumatic aortic (valve) stenosis Performing Phys: [id number] Woodfin Ganja THOMPSON Diagnosing Phys: Jenkins Rouge MD PROCEDURE: Intraoperative Transesophogeal TAVR procedure. Complications: No known complications during this procedure. POST-OP IMPRESSIONS TAVR : performed from left subclavian access. Introop TEE with 3D imaging Pre TAVR severe AS with tri leaflet AV no AR men gradient 28 mmHg peak 49 mmHg in setting of moderately reduced EF AVA 0.9 cm2. Post TAVR: well positioned 29 mm Sapien 3 valve Trivial PVL seen around 1:00 on SA images mean gradient 5 peak 10 mmHg AVA 2.3 cm2. EF improved 35-40% post procedure. PRE-OP FINDINGS  Left Ventricle: The left ventricle has moderate-severely reduced systolic function, with an ejection fraction of 30-35%. The cavity size was moderately dilated. There is no increase in left ventricular wall thickness. Findings are consistent with  idiopathic cardiomyopathy. Left ventrical global hypokinesis without regional wall motion abnormalities. Right Ventricle: The  right ventricle has mildly reduced systolic function. The cavity was mildly enlarged. There is no increase in right ventricular wall thickness. Left Atrium: Left atrial size was Severely dilated with smoke. The left atrial appendage is well visualized and there is no evidence of thrombus present. Right Atrium: Right atrial size was Severely dilated with pacing wires. Catheter present in the right atrium. Interatrial Septum: No atrial level shunt detected by color flow Doppler. Pericardium: There is no evidence of pericardial effusion. Mitral Valve: The mitral valve is dilated. Mitral valve regurgitation is mild by color flow Doppler. Tricuspid Valve: The tricuspid valve was normal in structure. Tricuspid valve regurgitation is mild by color flow Doppler. Aortic Valve: The aortic valve is tricuspid There is severe thickening of the aortic valve and There is severe calcifcation of the aortic valve Aortic valve regurgitation was not visualized by color flow Doppler. There is severe stenosis of the aortic valve, with a calculated valve area of 1.58 cm. There is severe aortic annular calcification noted. Pulmonic Valve: The pulmonic valve was not assessed. Pulmonic valve regurgitation was not assessed by color flow Doppler. +--------------+--------++ LEFT VENTRICLE         +--------------+--------++ PLAX 2D                +--------------+--------++ LVOT diam:    2.10 cm  +--------------+--------++ LVOT Area:    3.46 cm +--------------+--------++                        +--------------+--------++ +------------------+------------++ AORTIC VALVE                   +------------------+------------++ AV Area (Vmax):   1.54 cm     +------------------+------------++ AV Area (Vmean):  1.37 cm     +------------------+------------++ AV Area (VTI):    1.58 cm      +------------------+------------++ AV Vmax:          270.33 cm/s  +------------------+------------++ AV Vmean:         195.333 cm/s +------------------+------------++ AV VTI:           0.535 m      +------------------+------------++ AV Peak Grad:     29.2 mmHg    +------------------+------------++ AV Mean Grad:     18.3 mmHg    +------------------+------------++ LVOT Vmax:        120.00 cm/s  +------------------+------------++ LVOT Vmean:       77.450 cm/s  +------------------+------------++ LVOT VTI:         0.244 m      +------------------+------------++ LVOT/AV VTI ratio:0.46         +------------------+------------++  +--------------+-------+ SHUNTS                +--------------+-------+ Systemic VTI: 0.24 m  +--------------+-------+ Systemic Diam:2.10 cm +--------------+-------+  Jenkins Rouge MD Electronically signed by Jenkins Rouge MD Signature Date/Time: 05/03/2020/5:46:21 PM    Final    Structural Heart Procedure  Result Date: 05/03/2020 See surgical note for result.    Medications:     Scheduled Medications: . aspirin  81 mg Oral Daily  . Chlorhexidine Gluconate Cloth  6 each Topical Daily  . dapagliflozin propanediol  10 mg Oral QAC breakfast  . feeding supplement  237 mL Oral BID BM  . insulin aspart  0-9 Units Subcutaneous TID WC  . insulin aspart  2 Units Subcutaneous TID WC  . insulin glargine  5 Units Subcutaneous QHS  . sodium chloride flush  10-40 mL Intracatheter Q12H  Infusions: . sodium chloride 10 mL/hr at 05/04/20 0353  . cefUROXime (ZINACEF)  IV Stopped (05/04/20 0344)  . DOBUTamine 2.5 mcg/kg/min (05/04/20 0353)  . heparin 1,500 Units/hr (05/03/20 2216)  . nitroGLYCERIN Stopped (05/03/20 2008)  . phenylephrine (NEO-SYNEPHRINE) Adult infusion Stopped (05/03/20 2009)    PRN Medications: sodium chloride, acetaminophen, diclofenac Sodium, morphine injection, ondansetron (ZOFRAN) IV, sodium chloride flush, sodium chloride  flush, traMADol   Assessment/Plan:   1. 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 Aortic valve also appears severely calcified and moderate-severely stenosed, suspect AS also contributing to CM. Had recent [facility] 9/21 that showed no coronary disease.  -TAVR yesterday went well, resolution of AS and improvement in EF by 10%, TTE pending - Co ox improved after TAVR, would like to continue dobutamine, start back IV lasix today with plans to stop dobutamine tomorrow and possibly switch to oral torsemide  2. Mitral Regurgitation  - Moderate by TEE 8/21. - Severe by TTE 04/26/20 and consistent with severe functional MR - Hopefully will improve with AVR. Repeating post TAVR ECHO   3. Aortic Stenosis  - severe low flow/low gradient aortic stenosis with mean gradient 15.5 mmHg, dimensionless index 0.23, AVA 0.86 cm2  -ECHO 2/1 with similar findings mean gradient 21, AVA 0.87 cm2 - s/p successful TAVR procedure 2/8 with resolution of AS  4. Atrial Flutter/ Chronic Atrial Fibrillation - s/p AFL ablation. -In chronic Afib, rate controlled. -Switched to heparin on 2/3 for TAVR procedure, holding eliquis - continue holding bb - can likely switch back to eliquis this afternoon  5. H/o CHB  - s/p PPM followed by Dr. Lovena Le  - ? RV paced CM. He is pacing ~40%. - Will optimize patient's volume and discuss with EP BIV upgrade   6. NASH cirrhosis -followed by Dr. Jenetta Downer, NAFLD score 2.90 consistent with advanced fibrosis -Elastography ordered but not yet complete -no prior history of bleeding or known varices  7. Stage IIIb CKD -Followed by nephrology. -Baseline SCr ~1.5-1.8 - Creatinine improved with diuresis.  Follow  with diuresis.   8. T2DM -On insulin.Hgba1C 6.8 (12/21) -On farxiga - no further hypoglycemia on current basal, bolus, SSI regimen  9. Chest wall hematoma - Uncertain etiology. No underlying rib fracture, no RP hematoma.  - Hgb stable at 9.7, platelets stable 122k.  - Stable appearing, heparin gtt held briefly 2/4 restarted 2/5 hopefully can get back on eliquis this afternoon   Length of Stay: 9   Katherine Roan MD 05/04/2020, 7:44 AM  Advanced Heart Failure Team Pager [phone] (M-F; [place])  Please contact [facility] for night-coverage after hours (4p -7a ) and weekends on [email or web address]  Patient seen and examined with the above-signed Advanced Practice Provider and/or Housestaff. I personally reviewed laboratory data, imaging studies and relevant notes. I independently examined the patient and formulated the important aspects of the plan. I have edited the note to reflect any of my changes or salient points. I have personally discussed the plan with the patient and/or family.  Underwent successful TAVR yesterday from left subclavian approach. Says breathing feels much better. Now on heparin and dobutamine. CVP 12  General:  Sitting up in chair No resp difficulty HEENT: normal  Neck: supple. JVP 10-12 Carotids 2+ bilat; no bruits. No lymphadenopathy or thryomegaly appreciated. Cor: PMI nondisplaced. Regular rate & rhythm. Small hematoma at access site Lungs: clear Abdomen: soft, nontender, nondistended. No hepatosplenomegaly. No bruits or masses. Good bowel sounds. Extremities: no cyanosis, clubbing, rash, edema Neuro: alert & orientedx3, cranial nerves grossly intact. moves all 4 extremities w/o difficulty. Affect pleasant  Doing well post-TAVR. Small hematoma at access site. D/w TCTS and PharmD. Will turn heparin down to low-dose (600u/hr). Switch to DOAC in am if hgb stable. Can stop dobutamine now. Likely stop lasix tomorrow.   Glori Bickers, MD  5:19 PM

## 2020-05-04 NOTE — Anesthesia Postprocedure Evaluation (Signed)
Anesthesia Post Note  Patient: John [name]  Procedure(s) Performed: TRANSCATHETER AORTIC VALVE REPLACEMENT, LEFT SUBCLAVIAN (Left Chest) TRANSESOPHAGEAL ECHOCARDIOGRAM (TEE) (N/A )     Patient location during evaluation: PACU Anesthesia Type: General Level of consciousness: sedated and patient cooperative Pain management: pain level controlled Vital Signs Assessment: post-procedure vital signs reviewed and stable Respiratory status: spontaneous breathing Cardiovascular status: stable Anesthetic complications: no   No complications documented.  Last Vitals:  Vitals:   05/04/20 0600 05/04/20 0816  BP: (!) 104/53 (!) 115/51  Pulse: 64 64  Resp: 18 20  Temp:  36.4 C  SpO2: 98% 96%    Last Pain:  Vitals:   05/04/20 0816  TempSrc: Oral  PainSc:                  Nolon Nations

## 2020-05-04 NOTE — Consult Note (Signed)
   [facility] Inpatient Consult   05/04/2020  John [name] [birth year] [id number]  [facility] [[facility]O] Patient: [facility]   Patient screened for hospitalization with noted high risk score for unplanned readmission risk and for length of stay to assess for a restart of potential [facility] service needs for post hospital transition.  Review of patient's medical record reveals patient ongoing medical treatment.  Plan:  Continue to follow progress and disposition to assess for post hospital care management needs.    For questions contact:   Natividad Brood, RN BSN [facility] Liaison  [phone] business mobile phone Toll free office [phone]  Fax number: [phone] [email or web address]

## 2020-05-05 DIAGNOSIS — Z952 Presence of prosthetic heart valve: Secondary | ICD-10-CM | POA: Diagnosis not present

## 2020-05-05 LAB — BASIC METABOLIC PANEL
Anion gap: 11 (ref 5–15)
BUN: 26 mg/dL — ABNORMAL HIGH (ref 8–23)
CO2: 24 mmol/L (ref 22–32)
Calcium: 7.8 mg/dL — ABNORMAL LOW (ref 8.9–10.3)
Chloride: 95 mmol/L — ABNORMAL LOW (ref 98–111)
Creatinine, Ser: 1.67 mg/dL — ABNORMAL HIGH (ref 0.61–1.24)
GFR, Estimated: 43 mL/min — ABNORMAL LOW (ref >60)
Glucose, Bld: 181 mg/dL — ABNORMAL HIGH (ref 70–99)
Potassium: 4 mmol/L (ref 3.5–5.1)
Sodium: 130 mmol/L — ABNORMAL LOW (ref 135–145)

## 2020-05-05 LAB — GLUCOSE, CAPILLARY
Glucose-Capillary: 147 mg/dL — ABNORMAL HIGH (ref 70–99)
Glucose-Capillary: 226 mg/dL — ABNORMAL HIGH (ref 70–99)
Glucose-Capillary: 212 mg/dL — ABNORMAL HIGH (ref 70–99)
Glucose-Capillary: 171 mg/dL — ABNORMAL HIGH (ref 70–99)

## 2020-05-05 LAB — CBC
HCT: 29.8 % — ABNORMAL LOW (ref 39.0–52.0)
Hemoglobin: 9.6 g/dL — ABNORMAL LOW (ref 13.0–17.0)
MCH: 30.1 pg (ref 26.0–34.0)
MCHC: 32.2 g/dL (ref 30.0–36.0)
MCV: 93.4 fL (ref 80.0–100.0)
Platelets: 133 10*3/uL — ABNORMAL LOW (ref 150–400)
RBC: 3.19 MIL/uL — ABNORMAL LOW (ref 4.22–5.81)
RDW: 17.8 % — ABNORMAL HIGH (ref 11.5–15.5)
WBC: 8.7 10*3/uL (ref 4.0–10.5)
nRBC: 0 % (ref 0.0–0.2)

## 2020-05-05 LAB — COOXEMETRY PANEL
Carboxyhemoglobin: 2.5 % — ABNORMAL HIGH (ref 0.5–1.5)
Carboxyhemoglobin: 2.5 % — ABNORMAL HIGH (ref 0.5–1.5)
Methemoglobin: 0.9 % (ref 0.0–1.5)
Methemoglobin: 0.8 % (ref 0.0–1.5)
O2 Saturation: 48 %
O2 Saturation: 62 %
Total hemoglobin: 9.7 g/dL — ABNORMAL LOW (ref 12.0–16.0)
Total hemoglobin: 10.3 g/dL — ABNORMAL LOW (ref 12.0–16.0)

## 2020-05-05 LAB — HEPARIN LEVEL (UNFRACTIONATED): Heparin Unfractionated: <0.1 IU/mL — ABNORMAL LOW (ref 0.30–0.70)

## 2020-05-05 MED ORDER — APIXABAN 5 MG PO TABS
5.0000 mg | ORAL_TABLET | Freq: Two times a day (BID) | ORAL | Status: DC
  Administered 2020-05-05 – 2020-05-15 (×21): 5 mg via ORAL
  Filled 2020-05-05 (×22): qty 1

## 2020-05-05 NOTE — Progress Notes (Signed)
Mobility Specialist - Progress Note   05/05/20 1434  Mobility  Activity Ambulated in hall  Level of Assistance Moderate assist, patient does 50-74%  Assistive Device Front wheel walker  Distance Ambulated (ft) 470 ft  Mobility Response Tolerated well  Mobility performed by Mobility specialist  $Mobility charge 1 Mobility   Pre-mobility: 63 HR During mobility: 84 HR Post-mobility: 68 HR  Pt mod assist to stand from chair, standby during ambulation. He endorsed L knee pain but was otherwise asx. Pt min assist to lift legs when getting into bed.   Pricilla Handler Mobility Specialist Mobility Specialist Phone: [phone]

## 2020-05-05 NOTE — Progress Notes (Addendum)
Patient ID: John [name], male   DOB: July 23, 1945, 75 y.o.   MRN: [id number]    Advanced Heart Failure Rounding Note   Subjective:    CVP remains 12. Drop in Coox to 48% and poor diuresis off dobutamine, BUN/Cr up slightly to 26/1.67.  Symptomatically he has noticed no difference says he is breathing comfortably.     Intraop TEE:  Pre TAVR severe AS with tri leaflet AV no AR men gradient 28 mmHg peak 49 mmHg  in  setting of moderately reduced EF AVA 0.9 cm2.   Post TAVR: well positioned 29 mm Sapien 3 valve Trivial PVL seen around  1:00 on  SA images mean gradient 5 peak 10 mmHg AVA 2.3 cm2.  EF improved 35-40% post procedure   Objective:   Weight Range:  Vital Signs:   Temp:  [97.6 F (36.4 C)-98.3 F (36.8 C)] 98.3 F (36.8 C) (02/10 0358) Pulse Rate:  [60-64] 60 (02/10 0358) Resp:  [16-20] 19 (02/10 0743) BP: (103-115)/(48-56) 104/48 (02/10 0358) SpO2:  [94 %-100 %] 99 % (02/10 0743) Weight:  [102.8 kg] 102.8 kg (02/10 0604) Last BM Date: 05/03/20  Weight change: Filed Weights   05/02/20 0315 05/04/20 0600 05/05/20 0604  Weight: 104.6 kg 99.7 kg 102.8 kg    Intake/Output:   Intake/Output Summary (Last 24 hours) at 05/05/2020 0745 Last data filed at 05/05/2020 4332 Gross per 24 hour  Intake 730.44 ml  Output 2075 ml  Net -1344.56 ml     Physical Exam: Cardiac: JVD 12 cm, normal rate and rhythm, clear s1 and S2,  no rubs or gallops, Trace-1+ LE edema bilaterally with interval worsening Pulmonary: no rhonci, normal RR, not in distress Abdominal: non distended abdomen, soft and nontender Skin: Large hematoma along left mid axillary line stable in size, no longer tender, very small swelling around left subclavian stable Psych: Alert, conversant, in good spirits   Telemetry: AF, RV paced.  Personally reviewed  RHC:  RA = 12 RV = 51/11 PA = 52/15 (29) PCW = 29 ( v = 44) Fick cardiac output/index = 4.5/2.0 Thermo CO/CI = 5.2/2.3 PVR = < 1.0 WU Ao sat =  110% PA sat = 64%, 66%  Assessment: 1. Markedly elevated left sided filling pressures with prominent v-waves in PCWP tracing 2. Moderate reduced cardiac index 3. SBPs in 90s with severe AS   Labs: Basic Metabolic Panel: Recent Labs  Lab 05/01/20 0340 05/02/20 0325 05/02/20 1147 05/03/20 0305 05/03/20 1549 05/03/20 1637 05/03/20 1810 05/04/20 0210 05/05/20 0126  NA 133* 130*  --  131* 136 135 136 134* 130*  K 3.5 3.3*  --  3.3* 3.4* 3.3* 3.3* 3.3* 4.0  CL 99 95*  --  95* 94* 95* 97* 98 95*  CO2 23 23  --  25  --   --   --  26 24  GLUCOSE 139* 240*  --  128* 118* 116* 118* 101* 181*  BUN 20 20  --  20 22 21 22 20  26*  CREATININE 1.67* 1.48*  --  1.58* 1.40* 1.40* 1.40* 1.44* 1.67*  CALCIUM 7.7* 7.8*  --  8.1*  --   --   --  8.1* 7.8*  MG  --   --  2.6* 2.6*  --   --   --  2.5*  --     Liver Function Tests: Recent Labs  Lab 04/29/20 0402 05/03/20 0305  AST 33 28  ALT 15 15  ALKPHOS 127* 118  BILITOT 3.9* 4.1*  PROT 5.5* 6.0*  ALBUMIN 2.3* 2.3*   No results for input(s): LIPASE, AMYLASE in the last 168 hours. No results for input(s): AMMONIA in the last 168 hours.  CBC: Recent Labs  Lab 05/01/20 1620 05/02/20 0325 05/03/20 0305 05/03/20 1549 05/03/20 1637 05/03/20 1810 05/04/20 0210 05/05/20 0126  WBC 9.5 9.3 8.3  --   --   --  8.0 8.7  HGB 9.6* 9.8* 9.9* 11.6* 10.9* 10.9* 9.7* 9.6*  HCT 30.2* 31.2* 30.7* 34.0* 32.0* 32.0* 30.3* 29.8*  MCV 95.6 96.0 93.9  --   --   --  94.7 93.4  PLT 116* 128* 142*  --   --   --  122* 133*    Cardiac Enzymes: No results for input(s): CKTOTAL, CKMB, CKMBINDEX, TROPONINI in the last 168 hours.  BNP: BNP (last 3 results) Recent Labs    03/09/20 1544 03/20/20 1253 04/25/20 1157  BNP 1,540.0* 2,072.0* 2,410.2*    ProBNP (last 3 results) No results for input(s): PROBNP in the last 8760 hours.    Other results:  Imaging: DG Chest Port 1 View  Result Date: 05/03/2020 CLINICAL DATA:  Post TAVR EXAM: PORTABLE  CHEST 1 VIEW COMPARISON:  04/25/2020 FINDINGS: Left pacer remains in place, unchanged. Changes of TAVR. Right PICC line in place with the tip at the cavoatrial junction. Cardiomegaly. Vascular congestion and bilateral interstitial and airspace opacities, likely edema. Small bilateral effusions. No pneumothorax. IMPRESSION: Changes of TAVR. Cardiomegaly.  Mild CHF. Small bilateral effusions. Electronically Signed   By: Rolm Baptise M.D.   On: 05/03/2020 20:22   ECHO INTRAOPERATIVE TEE  Result Date: 05/03/2020  *INTRAOPERATIVE TRANSESOPHAGEAL REPORT *  Patient Name:   John [name] Date of Exam: 05/03/2020 Medical Rec #:  [id number]        Height:       72.0 in Accession #:    [id number]       Weight:       230.6 lb Date of Birth:  05-20-1945        BSA:          2.26 m Patient Age:    41 years         BP:           91/67 mmHg Patient Gender: M                HR:           81 bpm. Exam Location:  Inpatient Transesophogeal exam was perform intraoperatively during surgical procedure. Patient was closely monitored under general anesthesia during the entirety of examination. Indications:     I35.0 Nonrheumatic aortic (valve) stenosis Performing Phys: [id number] Woodfin Ganja THOMPSON Diagnosing Phys: Jenkins Rouge MD PROCEDURE: Intraoperative Transesophogeal TAVR procedure. Complications: No known complications during this procedure. POST-OP IMPRESSIONS TAVR : performed from left subclavian access. Introop TEE with 3D imaging Pre TAVR severe AS with tri leaflet AV no AR men gradient 28 mmHg peak 49 mmHg in setting of moderately reduced EF AVA 0.9 cm2. Post TAVR: well positioned 29 mm Sapien 3 valve Trivial PVL seen around 1:00 on SA images mean gradient 5 peak 10 mmHg AVA 2.3 cm2. EF improved 35-40% post procedure. PRE-OP FINDINGS  Left Ventricle: The left ventricle has moderate-severely reduced systolic function, with an ejection fraction of 30-35%. The cavity size was moderately dilated. There is no increase in left  ventricular wall thickness. Findings are consistent with idiopathic cardiomyopathy. Left ventrical global hypokinesis without regional  wall motion abnormalities. Right Ventricle: The right ventricle has mildly reduced systolic function. The cavity was mildly enlarged. There is no increase in right ventricular wall thickness. Left Atrium: Left atrial size was Severely dilated with smoke. The left atrial appendage is well visualized and there is no evidence of thrombus present. Right Atrium: Right atrial size was Severely dilated with pacing wires. Catheter present in the right atrium. Interatrial Septum: No atrial level shunt detected by color flow Doppler. Pericardium: There is no evidence of pericardial effusion. Mitral Valve: The mitral valve is dilated. Mitral valve regurgitation is mild by color flow Doppler. Tricuspid Valve: The tricuspid valve was normal in structure. Tricuspid valve regurgitation is mild by color flow Doppler. Aortic Valve: The aortic valve is tricuspid There is severe thickening of the aortic valve and There is severe calcifcation of the aortic valve Aortic valve regurgitation was not visualized by color flow Doppler. There is severe stenosis of the aortic valve, with a calculated valve area of 1.58 cm. There is severe aortic annular calcification noted. Pulmonic Valve: The pulmonic valve was not assessed. Pulmonic valve regurgitation was not assessed by color flow Doppler. +--------------+--------++ LEFT VENTRICLE         +--------------+--------++ PLAX 2D                +--------------+--------++ LVOT diam:    2.10 cm  +--------------+--------++ LVOT Area:    3.46 cm +--------------+--------++                        +--------------+--------++ +------------------+------------++ AORTIC VALVE                   +------------------+------------++ AV Area (Vmax):   1.54 cm     +------------------+------------++ AV Area (Vmean):  1.37 cm      +------------------+------------++ AV Area (VTI):    1.58 cm     +------------------+------------++ AV Vmax:          270.33 cm/s  +------------------+------------++ AV Vmean:         195.333 cm/s +------------------+------------++ AV VTI:           0.535 m      +------------------+------------++ AV Peak Grad:     29.2 mmHg    +------------------+------------++ AV Mean Grad:     18.3 mmHg    +------------------+------------++ LVOT Vmax:        120.00 cm/s  +------------------+------------++ LVOT Vmean:       77.450 cm/s  +------------------+------------++ LVOT VTI:         0.244 m      +------------------+------------++ LVOT/AV VTI ratio:0.46         +------------------+------------++  +--------------+-------+ SHUNTS                +--------------+-------+ Systemic VTI: 0.24 m  +--------------+-------+ Systemic Diam:2.10 cm +--------------+-------+  Jenkins Rouge MD Electronically signed by Jenkins Rouge MD Signature Date/Time: 05/03/2020/5:46:21 PM    Final    ECHOCARDIOGRAM LIMITED  Result Date: 05/04/2020    ECHOCARDIOGRAM REPORT   Patient Name:   John [name] Date of Exam: 05/04/2020 Medical Rec #:  [id number]        Height:       72.0 in Accession #:    [id number]       Weight:       219.8 lb Date of Birth:  [birth year]        BSA:          2.218 m Patient Age:  74 years         BP:           115/51 mmHg Patient Gender: M                HR:           63 bpm. Exam Location:  Inpatient Procedure: Limited Echo, Limited Color Doppler and Cardiac Doppler Indications:    Post TAVR evaluation V43.3 / Z95.2  History:        Patient has prior history of Echocardiogram examinations, most                 recent 05/03/2020. CHF, Pacemaker, Aortic Valve Disease and Mitral                 Valve Disease, Arrythmias:Atrial Flutter and Atrial                 Fibrillation; Risk Factors:Diabetes. Chronic kidney disease.                 Aortic Valve: 29 mm Sapien prosthetic, stented  (TAVR) valve is                 present in the aortic position. Procedure Date: 05/03/2020.  Sonographer:    Darlina Sicilian RDCS Referring Phys: [id number] [place]  1. Diffuse hypokinesis abnormal septal motion . Left ventricular ejection fraction, by estimation, is 30 to 35%. The left ventricle has moderately decreased function. The left ventricle demonstrates global hypokinesis. The left ventricular internal cavity size was mildly dilated. Left ventricular diastolic parameters are consistent with Grade II diastolic dysfunction (pseudonormalization). Elevated left ventricular end-diastolic pressure.  2. Pacing wires in RV . Right ventricular systolic function was not well visualized. The right ventricular size is not well visualized. There is mildly elevated pulmonary artery systolic pressure.  3. The mitral valve is abnormal. Mild to moderate mitral valve regurgitation.  4. Post TAVR with 29 mm Sapien 3 valve no significant PVL mean gradient 9 peak 19 mmHg AVA 2.8 cm2 DVI 0.42. The aortic valve has been repaired/replaced. Aortic valve regurgitation is not visualized. There is a 29 mm Sapien prosthetic (TAVR) valve present in the aortic position. Procedure Date: 05/03/2020. FINDINGS  Left Ventricle: Diffuse hypokinesis abnormal septal motion. Left ventricular ejection fraction, by estimation, is 30 to 35%. The left ventricle has moderately decreased function. The left ventricle demonstrates global hypokinesis. The left ventricular internal cavity size was mildly dilated. There is no left ventricular hypertrophy. Left ventricular diastolic parameters are consistent with Grade II diastolic dysfunction (pseudonormalization). Elevated left ventricular end-diastolic pressure. Right Ventricle: Pacing wires in RV. The right ventricular size is not well visualized. Right vetricular wall thickness was not assessed. Right ventricular systolic function was not well visualized. There is mildly elevated  pulmonary artery systolic pressure. The tricuspid regurgitant velocity is 3.06 m/s, and with an assumed right atrial pressure of 3 mmHg, the estimated right ventricular systolic pressure is 25.4 mmHg. Left Atrium: Left atrial size was not assessed. Right Atrium: Right atrial size was not assessed. Pericardium: There is no evidence of pericardial effusion. Mitral Valve: The mitral valve is abnormal. There is moderate thickening of the mitral valve leaflet(s). There is mild calcification of the mitral valve leaflet(s). Mild mitral annular calcification. Mild to moderate mitral valve regurgitation. Tricuspid Valve: The tricuspid valve is not assessed. Tricuspid valve regurgitation is mild. Aortic Valve: Post TAVR with 29 mm Sapien 3 valve no significant PVL mean gradient 9 peak  19 mmHg AVA 2.8 cm2 DVI 0.42. The aortic valve has been repaired/replaced. Aortic valve regurgitation is not visualized. Aortic valve mean gradient measures 8.7 mmHg. Aortic valve peak gradient measures 16.9 mmHg. Aortic valve area, by VTI measures 2.42 cm. There is a 29 mm Sapien prosthetic, stented (TAVR) valve present in the aortic position. Procedure Date: 05/03/2020. Pulmonic Valve: The pulmonic valve was not assessed. Pulmonic valve regurgitation is not visualized. Aorta: Aortic root could not be assessed. IAS/Shunts: The interatrial septum was not assessed.  LEFT VENTRICLE PLAX 2D LVIDd:         5.80 cm  Diastology LVIDs:         4.20 cm  LV e' medial:    4.28 cm/s LV PW:         0.90 cm  LV E/e' medial:  22.5 LV IVS:        0.90 cm  LV e' lateral:   5.48 cm/s LVOT diam:     2.70 cm  LV E/e' lateral: 17.6 LV SV:         81 LV SV Index:   36 LVOT Area:     5.73 cm  AORTIC VALVE AV Area (Vmax):    2.54 cm AV Area (Vmean):   2.77 cm AV Area (VTI):     2.42 cm AV Vmax:           205.67 cm/s AV Vmean:          137.000 cm/s AV VTI:            0.333 m AV Peak Grad:      16.9 mmHg AV Mean Grad:      8.7 mmHg LVOT Vmax:         91.40 cm/s  LVOT Vmean:        66.200 cm/s LVOT VTI:          0.141 m LVOT/AV VTI ratio: 0.42 MITRAL VALVE                 TRICUSPID VALVE MV Area (PHT): 5.84 cm      TR Peak grad:   37.5 mmHg MV Decel Time: 130 msec      TR Vmax:        306.00 cm/s MR Peak grad:    60.8 mmHg MR Mean grad:    42.0 mmHg   SHUNTS MR Vmax:         390.00 cm/s Systemic VTI:  0.14 m MR Vmean:        303.0 cm/s  Systemic Diam: 2.70 cm MR PISA:         1.57 cm MR PISA Eff ROA: 13 mm MR PISA Radius:  0.50 cm MV E velocity: 96.40 cm/s MV A velocity: 74.10 cm/s MV E/A ratio:  1.30 Jenkins Rouge MD Electronically signed by Jenkins Rouge MD Signature Date/Time: 05/04/2020/1:51:43 PM    Final    Structural Heart Procedure  Result Date: 05/03/2020 See surgical note for result.    Medications:     Scheduled Medications: . aspirin  81 mg Oral Daily  . Chlorhexidine Gluconate Cloth  6 each Topical Daily  . dapagliflozin propanediol  10 mg Oral QAC breakfast  . feeding supplement  237 mL Oral TID BM  . insulin aspart  0-9 Units Subcutaneous TID WC  . insulin aspart  2 Units Subcutaneous TID WC  . insulin glargine  5 Units Subcutaneous QHS  . sodium chloride flush  10-40 mL Intracatheter Q12H  Infusions: . sodium chloride Stopped (05/05/20 0730)  . cefUROXime (ZINACEF)  IV 1.5 g (05/05/20 0356)  . heparin 600 Units/hr (05/04/20 1419)    PRN Medications: sodium chloride, acetaminophen, diclofenac Sodium, morphine injection, ondansetron (ZOFRAN) IV, sodium chloride flush, sodium chloride flush, traMADol   Assessment/Plan:   1. 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 Aortic valve also appears severely calcified and moderate-severely stenosed, suspect AS also  contributing to CM. Had recent [facility] 9/21 that showed no coronary disease.  -TAVR 2/8 went well, resolution of AS and improvement in EF by 10%, TTE with similar findings also while he was on dobutamine - Coox down this am, will repeat, poor diuresis and worsening Cr may need to add back dobutamine, hold diuretic for now  2. Mitral Regurgitation  - Moderate by TEE 8/21. - Severe by TTE 04/26/20 and consistent with severe functional MR - Improved significantly after TAVR now mild-moderate  3. Aortic Stenosis  - severe low flow/low gradient aortic stenosis with mean gradient 15.5 mmHg, dimensionless index 0.23, AVA 0.86 cm2  -ECHO 2/1 with similar findings mean gradient 21, AVA 0.87 cm2 - s/p successful TAVR procedure 2/8 with resolution of AS  4. Atrial Flutter/ Chronic Atrial Fibrillation - s/p AFL ablation. -In chronic Afib, rate controlled. -Switched to heparin on 2/3 for TAVR procedure, holding eliquis - continue holding bb - stop heparin switch back to eliquis today  5. H/o CHB  - s/p PPM followed by Dr. Lovena Le  - ? RV paced CM. He is pacing ~40%. - Will optimize patient's volume and discuss with EP BIV upgrade   6. NASH cirrhosis -followed by Dr. Jenetta Downer, NAFLD score 2.90 consistent with advanced fibrosis -Elastography ordered but not yet complete -no prior history of bleeding or known varices  7. Stage IIIb CKD -Followed by nephrology. -Baseline SCr ~1.5-1.8 - Creatinine up a bit monitor closely repeating coox and holding diuretic for now  8. T2DM -On insulin.Hgba1C 6.8 (12/21) -On farxiga - no further hypoglycemia on current basal, bolus, SSI regimen  9. Chest wall hematoma - Uncertain etiology. No underlying rib fracture, no RP hematoma.  - Hgb stable at 9.7, platelets stable 122k.  - Stable appearing, heparin gtt held briefly 2/4 restarted 2/5 hold heparin restart eliquis  10.Swelling around left subclavian access site: -remains very small, hgb  stable, stop heparin transition to eliquis today  Length of Stay: 10   Katherine Roan MD 05/05/2020, 7:45 AM  Advanced Heart Failure Team Pager [phone] (M-F; [place])  Please contact [facility] for night-coverage after hours (4p -7a ) and weekends on [email or web address]   Patient seen and examined with the above-signed Advanced Practice Provider and/or Housestaff. I personally reviewed laboratory data, imaging studies and relevant notes. I independently examined the patient and formulated the important aspects of the plan. I have edited the note to reflect any of my changes or salient points. I have personally discussed the plan with the patient and/or family.  Feels good. No SOB, orthopnea or PND. Early am co-ox 48% but repeat 62% off dobutamine. Creatinine up slightly. Heparin switched to Eliquis.   Echo EF 30-35% well-seated TAVR valve  General:  Sitting up in chair No resp  difficulty HEENT: normal Neck: supple. JVP 9-10 Carotids 2+ bilat; no bruits. No lymphadenopathy or thryomegaly appreciated. Cor: PMI nondisplaced. Regular rate & rhythm. No rubs, gallops or murmurs. Chest site ok Lungs: clear Abdomen: soft, nontender, nondistended. No hepatosplenomegaly. No bruits or masses. Good bowel sounds. Extremities: no cyanosis, clubbing, rash, edema Neuro: alert & orientedx3, cranial nerves grossly intact. moves all 4 extremities w/o difficulty. Affect pleasant  Stable post-TAVR. Co-oc improved. Agree with switching to Eliquis. Hold diuretics today.   Home 24-48 hours if renal function stable/improved.   Glori Bickers, MD  6:06 PM

## 2020-05-05 NOTE — Progress Notes (Signed)
2 Days Post-Op Procedure(s) (LRB): TRANSCATHETER AORTIC VALVE REPLACEMENT, LEFT SUBCLAVIAN (Left) TRANSESOPHAGEAL ECHOCARDIOGRAM (TEE) (N/A) Subjective: Feels well. Mild discomfort in left elbow.  Objective: Vital signs in last 24 hours: Temp:  [97.6 F (36.4 C)-98.3 F (36.8 C)] 98.3 F (36.8 C) (02/10 0358) Pulse Rate:  [60-64] 60 (02/10 0358) Cardiac Rhythm: Ventricular paced (02/10 0743) Resp:  [16-20] 19 (02/10 0743) BP: (103-115)/(48-56) 104/48 (02/10 0358) SpO2:  [94 %-100 %] 99 % (02/10 0743) Weight:  [102.8 kg] 102.8 kg (02/10 0604)  Hemodynamic parameters for last 24 hours: CVP:  [5 mmHg-12 mmHg] 12 mmHg  Intake/Output from previous day: 02/09 0701 - 02/10 0700 In: 730.4 [P.O.:240; I.V.:290.4; IV Piggyback:200] Out: 2075 [Urine:2075] Intake/Output this shift: No intake/output data recorded.  General appearance: alert and cooperative Neurologic: intact Heart: regular rate and rhythm, S1, S2 normal, no murmur Lungs: clear to auscultation bilaterally Wound: incision looks good. Groin site ok  Lab Results: Recent Labs    05/04/20 0210 05/05/20 0126  WBC 8.0 8.7  HGB 9.7* 9.6*  HCT 30.3* 29.8*  PLT 122* 133*   BMET:  Recent Labs    05/04/20 0210 05/05/20 0126  NA 134* 130*  K 3.3* 4.0  CL 98 95*  CO2 26 24  GLUCOSE 101* 181*  BUN 20 26*  CREATININE 1.44* 1.67*  CALCIUM 8.1* 7.8*    PT/INR:  Recent Labs    05/02/20 1450  LABPROT 16.2*  INR 1.4*   ABG    Component Value Date/Time   PHART 7.484 (H) 05/03/2020 0335   HCO3 26.0 05/03/2020 0335   TCO2 27 05/03/2020 1810   O2SAT 48.0 05/05/2020 0120   CBG (last 3)  Recent Labs    05/04/20 1559 05/04/20 2105 05/05/20 0604  GLUCAP 208* 196* 147*    Assessment/Plan: S/P Procedure(s) (LRB): TRANSCATHETER AORTIC VALVE REPLACEMENT, LEFT SUBCLAVIAN (Left) TRANSESOPHAGEAL ECHOCARDIOGRAM (TEE) (N/A)  POD 2 Hemodynamically stable off dobutamine but Co-ox lower at 48 this morning. Creat up  a little to 1.67 which may be contrast related.  2D echo reviewed and TAVR valve looks good with low mean gradient of 9 mm Hg, no PVL seen. EF 30-35%.  Incision looks good. Ok to start oral anticoagulant.  Plans per heart failure team.   LOS: 10 days    Gaye Pollack 05/05/2020

## 2020-05-05 NOTE — Progress Notes (Signed)
Physical Therapy Treatment Patient Details Name: John [name] MRN: [id number] DOB: Feb 25, 1946 Today's Date: 05/05/2020    History of Present Illness Pt admit with chronic heart failure and Afib. Pt work up for TAVR.    PT Comments    Pt received seated in chair with legs down, pleasantly agreeable to therapy session and with good participation and tolerance for mobility. Pt able to progress gait distance to greater than household distances with no loss of balance using RW and min guard for safety. Pt remains limited mostly due to continued BLE edema, reviewed techniques/BLE elevated in bed/chair for edema relief as well as ankle pumps, pt also reporting continued L knee pain which makes transfers more difficult, pt needing min to modA for sit<>stand and with noted decreased eccentric control to sit in chair, which is below his baseline of no device for mobility and no difficulty with standing transfers. Pt reports 6-7/10 modified RPE (fatigue) at end of session. Pt continues to benefit from PT services to progress toward functional mobility goals. Continue to recommend HHPT, pt will benefit from RW use initially due to continued balance deficit and decreased activity tolerance post-op.    Follow Up Recommendations  Home health PT;Supervision/Assistance - 24 hour     Equipment Recommendations  None recommended by PT    Recommendations for Other Services       Precautions / Restrictions Precautions Precautions: Fall Restrictions Weight Bearing Restrictions: No    Mobility  Bed Mobility Overal bed mobility: Needs Assistance             General bed mobility comments: pt up in chair pre/post session    Transfers Overall transfer level: Needs assistance Equipment used: Rolling walker (2 wheeled) Transfers: Sit to/from Stand Sit to Stand: Min assist;Mod assist;+2 physical assistance         General transfer comment: cues for sequencing/hand placement, LLE advanced due to  L knee pain, x3 trials and needs momentum strategy and +1-2 support to achieve upright  Ambulation/Gait Ambulation/Gait assistance: Min guard Gait Distance (Feet): 275 Feet Assistive device: Rolling walker (2 wheeled) Gait Pattern/deviations: Step-through pattern;Decreased stride length (downward gaze) Gait velocity: decreased   General Gait Details: cues for proximity to RW at times and forward gaze, pt tending to look down throughout; HR 60-82 bpm during gait task, needs some navigational cues in tight spaces in room and cues for relaxed BUE/shoulders; chair follow for safety but no unsteadiness   Stairs             Wheelchair Mobility    Modified Rankin (Stroke Patients Only)       Balance Overall balance assessment: Needs assistance Sitting-balance support: No upper extremity supported;Feet supported Sitting balance-Leahy Scale: Fair     Standing balance support: No upper extremity supported Standing balance-Leahy Scale: Poor Standing balance comment: heavily reliant on BUE support of RW, external assist mostly for safety                            Cognition Arousal/Alertness: Awake/alert Behavior During Therapy: WFL for tasks assessed/performed Overall Cognitive Status: Within Functional Limits for tasks assessed                                 General Comments: pleasantly cooperative, some increased time/cues for safety with mobility      Exercises General Exercises - Lower Extremity Ankle Circles/Pumps: AROM;Both;10  reps;Supine Long Arc Quad: AROM;Both;10 reps;Seated Hip Flexion/Marching: AROM;Both;10 reps;Seated Other Exercises Other Exercises: STS x 3 from chair    General Comments General comments (skin integrity, edema, etc.): 2+ pitting edema BLE, encouraged BLE elevated and ankle pumps with heels floated for pressure relief in bed/chair      Pertinent Vitals/Pain Pain Assessment: 0-10 Pain Score: 6  Pain Location: L  knee pain since post-op Pain Descriptors / Indicators: Grimacing;Discomfort;Tender Pain Intervention(s): Monitored during session;Repositioned;Patient requesting pain meds-RN notified    Home Living                      Prior Function            PT Goals (current goals can now be found in the care plan section) Acute Rehab PT Goals Patient Stated Goal: home PT Goal Formulation: With patient/family Time For Goal Achievement: 05/12/20 Potential to Achieve Goals: Good Progress towards PT goals: Progressing toward goals    Frequency    Min 3X/week      PT Plan Current plan remains appropriate    Co-evaluation              AM-PAC PT "6 Clicks" Mobility   Outcome Measure  Help needed turning from your back to your side while in a flat bed without using bedrails?: None Help needed moving from lying on your back to sitting on the side of a flat bed without using bedrails?: None Help needed moving to and from a bed to a chair (including a wheelchair)?: A Little Help needed standing up from a chair using your arms (e.g., wheelchair or bedside chair)?: A Lot Help needed to walk in hospital room?: A Little Help needed climbing 3-5 steps with a railing? : A Little 6 Click Score: 19    End of Session Equipment Utilized During Treatment: Gait belt Activity Tolerance: Patient tolerated treatment well Patient left: with call bell/phone within reach;with family/visitor present;in bed;with bed alarm set Nurse Communication: Mobility status PT Visit Diagnosis: Muscle weakness (generalized) (M62.81);Unsteadiness on feet (R26.81)     Time: 4935-5217 PT Time Calculation (min) (ACUTE ONLY): 26 min  Charges:  $Gait Training: 8-22 mins $Therapeutic Exercise: 8-22 mins                     Gar Glance P., PTA Acute Rehabilitation Services Pager: [phone] Office: [place] 05/05/2020, 1:56 PM

## 2020-05-06 ENCOUNTER — Inpatient Hospital Stay (HOSPITAL_COMMUNITY): Payer: Medicare HMO

## 2020-05-06 DIAGNOSIS — R2232 Localized swelling, mass and lump, left upper limb: Secondary | ICD-10-CM | POA: Diagnosis not present

## 2020-05-06 DIAGNOSIS — M79602 Pain in left arm: Secondary | ICD-10-CM

## 2020-05-06 DIAGNOSIS — Z952 Presence of prosthetic heart valve: Secondary | ICD-10-CM | POA: Diagnosis not present

## 2020-05-06 LAB — CBC
HCT: 29.4 % — ABNORMAL LOW (ref 39.0–52.0)
Hemoglobin: 9.4 g/dL — ABNORMAL LOW (ref 13.0–17.0)
MCH: 29.9 pg (ref 26.0–34.0)
MCHC: 32 g/dL (ref 30.0–36.0)
MCV: 93.6 fL (ref 80.0–100.0)
Platelets: 122 10*3/uL — ABNORMAL LOW (ref 150–400)
RBC: 3.14 MIL/uL — ABNORMAL LOW (ref 4.22–5.81)
RDW: 17.9 % — ABNORMAL HIGH (ref 11.5–15.5)
WBC: 8 10*3/uL (ref 4.0–10.5)
nRBC: 0 % (ref 0.0–0.2)

## 2020-05-06 LAB — BASIC METABOLIC PANEL
Anion gap: 10 (ref 5–15)
BUN: 25 mg/dL — ABNORMAL HIGH (ref 8–23)
CO2: 25 mmol/L (ref 22–32)
Calcium: 8.2 mg/dL — ABNORMAL LOW (ref 8.9–10.3)
Chloride: 96 mmol/L — ABNORMAL LOW (ref 98–111)
Creatinine, Ser: 1.47 mg/dL — ABNORMAL HIGH (ref 0.61–1.24)
GFR, Estimated: 50 mL/min — ABNORMAL LOW (ref >60)
Glucose, Bld: 191 mg/dL — ABNORMAL HIGH (ref 70–99)
Potassium: 3.7 mmol/L (ref 3.5–5.1)
Sodium: 131 mmol/L — ABNORMAL LOW (ref 135–145)

## 2020-05-06 LAB — COOXEMETRY PANEL
Carboxyhemoglobin: 2.5 % — ABNORMAL HIGH (ref 0.5–1.5)
Methemoglobin: 0.9 % (ref 0.0–1.5)
O2 Saturation: 54.8 %
Total hemoglobin: 9.6 g/dL — ABNORMAL LOW (ref 12.0–16.0)

## 2020-05-06 LAB — GLUCOSE, CAPILLARY
Glucose-Capillary: 165 mg/dL — ABNORMAL HIGH (ref 70–99)
Glucose-Capillary: 227 mg/dL — ABNORMAL HIGH (ref 70–99)
Glucose-Capillary: 137 mg/dL — ABNORMAL HIGH (ref 70–99)
Glucose-Capillary: 122 mg/dL — ABNORMAL HIGH (ref 70–99)

## 2020-05-06 MED ORDER — TORSEMIDE 20 MG PO TABS
40.0000 mg | ORAL_TABLET | Freq: Every day | ORAL | Status: DC
  Administered 2020-05-06 – 2020-05-07 (×2): 40 mg via ORAL
  Filled 2020-05-06 (×2): qty 2

## 2020-05-06 MED ORDER — POTASSIUM CHLORIDE CRYS ER 20 MEQ PO TBCR
40.0000 meq | EXTENDED_RELEASE_TABLET | Freq: Once | ORAL | Status: AC
  Administered 2020-05-06: 40 meq via ORAL
  Filled 2020-05-06: qty 2

## 2020-05-06 NOTE — Discharge Instructions (Signed)
ACTIVITY AND EXERCISE . Daily activity and exercise are an important part of your recovery. People recover at different rates depending on their general health and type of valve procedure. . Most people recovering from TAVR feel better relatively quickly  . No lifting, pushing, pulling more than 10 pounds (examples to avoid: groceries, vacuuming, gardening, golfing):             - For one week with a procedure through the groin.             - For six weeks for procedures through the chest wall or neck. NOTE: You will typically see one of our providers 7-14 days after your procedure to discuss [place] the above activities.      DRIVING . Do not drive until you are seen for follow up and cleared by a provider. Generally, we ask patient to not drive for 1 week after their procedure. . If you have been told by your doctor in the past that you may not drive, you must talk with him/her before you begin driving again.   DRESSING . Groin site: you may leave the clear dressing over the site for up to one week or until it falls off.   HYGIENE . If you had a femoral (leg) procedure, you may take a shower when you return home. After the shower, pat the site dry. Do NOT use powder, oils or lotions in your groin area until the site has completely healed. . If you had a chest procedure, you may shower when you return home unless specifically instructed not to by your discharging practitioner.             - DO NOT scrub incision; pat dry with a towel.             - DO NOT apply any lotions, oils, powders to the incision.             - No tub baths / swimming for at least 2 weeks. . If you notice any fevers, chills, increased pain, swelling, bleeding or pus, please contact your doctor.   ADDITIONAL INFORMATION . If you are going to have an upcoming dental procedure, please contact our office as you will require antibiotics ahead of time to prevent infection on your heart valve.    If you have any  questions or concerns you can call the structural heart phone during normal business hours 8am-4pm. If you have an urgent need after hours or weekends please call [phone] to talk to the on call provider for general cardiology. If you have an emergency that requires immediate attention, please call 911.    After TAVR Checklist  Check  Test Description   Follow up appointment in 1-2 weeks  You will see our structural heart physician assistant, Nell Range. Your incision sites will be checked and you will be cleared to drive and resume all normal activities if you are doing well.     1 month echo and follow up  You will have an echo to check on your new heart valve and be seen back in the office by Nell Range. Many times the echo is not read by your appointment time, but Joellen Jersey will call you later that day or the following day to report your results.   Follow up with your primary cardiologist You will need to be seen by your primary cardiologist in the following 3-6 months after your 1 month appointment in the valve  clinic. Often times your Plavix or Aspirin will be discontinued during this time, but this is decided on a case by case basis.    1 year echo and follow up You will have another echo to check on your heart valve after 1 year and be seen back in the office by Nell Range. This your last structural heart visit.   Bacterial endocarditis prophylaxis  You will have to take antibiotics for the rest of your life before all dental procedures (even teeth cleanings) to protect your heart valve. Antibiotics are also required before some surgeries. Please check with your cardiologist before scheduling any surgeries. Also, please make sure to tell us if you have a penicillin allergy as you will require an alternative antibiotic.     Information on my medicine - ELIQUIS (apixaban)  Why was Eliquis prescribed for you? Eliquis was prescribed for you to reduce the risk of a blood clot  forming that can cause a stroke if you have a medical condition called atrial fibrillation (a type of irregular heartbeat).  What do You need to know about Eliquis ? Take your Eliquis TWICE DAILY - one tablet in the morning and one tablet in the evening with or without food. If you have difficulty swallowing the tablet whole please discuss with your pharmacist how to take the medication safely.  Take Eliquis exactly as prescribed by your doctor and DO NOT stop taking Eliquis without talking to the doctor who prescribed the medication.  Stopping may increase your risk of developing a stroke.  Refill your prescription before you run out.  After discharge, you should have regular check-up appointments with your healthcare provider that is prescribing your Eliquis.  In the future your dose may need to be changed if your kidney function or weight changes by a significant amount or as you get older.  What do you do if you miss a dose? If you miss a dose, take it as soon as you remember on the same day and resume taking twice daily.  Do not take more than one dose of ELIQUIS at the same time to make up a missed dose.  Important Safety Information A possible side effect of Eliquis is bleeding. You should call your healthcare provider right away if you experience any of the following: ? Bleeding from an injury or your nose that does not stop. ? Unusual colored urine (red or dark brown) or unusual colored stools (red or black). ? Unusual bruising for unknown reasons. ? A serious fall or if you hit your head (even if there is no bleeding).  Some medicines may interact with Eliquis and might increase your risk of bleeding or clotting while on Eliquis. To help avoid this, consult your healthcare provider or pharmacist prior to using any new prescription or non-prescription medications, including herbals, vitamins, non-steroidal anti-inflammatory drugs (NSAIDs) and supplements.  This website has more  information on Eliquis (apixaban): http://www.eliquis.com/eliquis/home

## 2020-05-06 NOTE — Progress Notes (Signed)
Upper extremity venous LT study completed.  Preliminary results relayed to RN.   See CV Proc for preliminary results report.   Darlin Coco, RDMS

## 2020-05-06 NOTE — Progress Notes (Signed)
CARDIAC REHAB PHASE I   TAVR education completed with pt and wife. Pt educated on importance of site care and monitoring incisions daily. Encouraged continued ambulation with emphasis on safety. Pt denies DME needs. Breathing much improved since procedure. Reviewed importance of daily weights. Pt and wife deny further questions or concerns at this time. Will refer to CRP II [place].  [id number] Rufina Falco, RN BSN 05/06/2020 8:31 AM

## 2020-05-06 NOTE — Progress Notes (Signed)
Patient ID: CADEN FUKUSHIMA, male   DOB: 09/14/1945, 75 y.o.   MRN: [id number]    Advanced Heart Failure Rounding Note   Subjective:    CVP 14. Weight up slightly around 1.5lbs , improvement in renal function with holding diuretics yesterday.  He feels good other than pain in left antecubital from prior IV.    C/o left shoulder/arm pain and swelling.    Objective:   Weight Range:  Vital Signs:   Temp:  [97.9 F (36.6 C)-98.7 F (37.1 C)] 98.7 F (37.1 C) (02/11 0736) Pulse Rate:  [61-68] 63 (02/11 0736) Resp:  [16-20] 18 (02/11 0736) BP: (110-121)/(49-58) 115/54 (02/11 0736) SpO2:  [96 %-100 %] 97 % (02/11 0736) Weight:  [103.4 kg] 103.4 kg (02/11 0603) Last BM Date: 05/05/20  Weight change: Filed Weights   05/04/20 0600 05/05/20 0604 05/06/20 0603  Weight: 99.7 kg 102.8 kg 103.4 kg    Intake/Output:   Intake/Output Summary (Last 24 hours) at 05/06/2020 0835 Last data filed at 05/06/2020 0606 Gross per 24 hour  Intake 360 ml  Output 1300 ml  Net -940 ml     Physical Exam: Cardiac: JVD 12 cm, normal rate and rhythm, clear s1 and S2,  no rubs or gallops, 1+ LE edema bilaterally  Pulmonary: no rhonci, normal RR, not in distress Abdominal: non distended abdomen, soft and nontender Skin: Large hematoma along left mid axillary line stable in size, no longer tender, very small swelling around left subclavian with interval improvement Psych: Alert, conversant, in good spirits   Telemetry: AF, RV paced.  Increased PVC's this am, Personally reviewed  RHC:  RA = 12 RV = 51/11 PA = 52/15 (29) PCW = 29 ( v = 44) Fick cardiac output/index = 4.5/2.0 Thermo CO/CI = 5.2/2.3 PVR = < 1.0 WU Ao sat = 110% PA sat = 64%, 66%  Assessment: 1. Markedly elevated left sided filling pressures with prominent v-waves in PCWP tracing 2. Moderate reduced cardiac index 3. SBPs in 90s with severe AS  Intraop TEE:  Pre TAVR severe AS with tri leaflet AV no AR men gradient 28 mmHg  peak 49 mmHg  in  setting of moderately reduced EF AVA 0.9 cm2.   Post TAVR: well positioned 29 mm Sapien 3 valve Trivial PVL seen around  1:00 on  SA images mean gradient 5 peak 10 mmHg AVA 2.3 cm2.  EF improved 35-40% post procedure  Labs: Basic Metabolic Panel: Recent Labs  Lab 05/02/20 0325 05/02/20 1147 05/03/20 0305 05/03/20 1549 05/03/20 1637 05/03/20 1810 05/04/20 0210 05/05/20 0126 05/06/20 0300  NA 130*  --  131*   < > 135 136 134* 130* 131*  K 3.3*  --  3.3*   < > 3.3* 3.3* 3.3* 4.0 3.7  CL 95*  --  95*   < > 95* 97* 98 95* 96*  CO2 23  --  25  --   --   --  26 24 25   GLUCOSE 240*  --  128*   < > 116* 118* 101* 181* 191*  BUN 20  --  20   < > 21 22 20  26* 25*  CREATININE 1.48*  --  1.58*   < > 1.40* 1.40* 1.44* 1.67* 1.47*  CALCIUM 7.8*  --  8.1*  --   --   --  8.1* 7.8* 8.2*  MG  --  2.6* 2.6*  --   --   --  2.5*  --   --    < > =  values in this interval not displayed.    Liver Function Tests: Recent Labs  Lab 05/03/20 0305  AST 28  ALT 15  ALKPHOS 118  BILITOT 4.1*  PROT 6.0*  ALBUMIN 2.3*   No results for input(s): LIPASE, AMYLASE in the last 168 hours. No results for input(s): AMMONIA in the last 168 hours.  CBC: Recent Labs  Lab 05/02/20 0325 05/03/20 0305 05/03/20 1549 05/03/20 1637 05/03/20 1810 05/04/20 0210 05/05/20 0126 05/06/20 0300  WBC 9.3 8.3  --   --   --  8.0 8.7 8.0  HGB 9.8* 9.9*   < > 10.9* 10.9* 9.7* 9.6* 9.4*  HCT 31.2* 30.7*   < > 32.0* 32.0* 30.3* 29.8* 29.4*  MCV 96.0 93.9  --   --   --  94.7 93.4 93.6  PLT 128* 142*  --   --   --  122* 133* 122*   < > = values in this interval not displayed.    Cardiac Enzymes: No results for input(s): CKTOTAL, CKMB, CKMBINDEX, TROPONINI in the last 168 hours.  BNP: BNP (last 3 results) Recent Labs    03/09/20 1544 03/20/20 1253 04/25/20 1157  BNP 1,540.0* 2,072.0* 2,410.2*    ProBNP (last 3 results) No results for input(s): PROBNP in the last 8760 hours.    Other  results:  Imaging: ECHOCARDIOGRAM LIMITED  Result Date: 05/04/2020    ECHOCARDIOGRAM REPORT   Patient Name:   JAIRUS TONNE Date of Exam: 05/04/2020 Medical Rec #:  [id number]        Height:       72.0 in Accession #:    [id number]       Weight:       219.8 lb Date of Birth:  10/16/1945        BSA:          2.218 m Patient Age:    26 years         BP:           115/51 mmHg Patient Gender: M                HR:           63 bpm. Exam Location:  Inpatient Procedure: Limited Echo, Limited Color Doppler and Cardiac Doppler Indications:    Post TAVR evaluation V43.3 / Z95.2  History:        Patient has prior history of Echocardiogram examinations, most                 recent 05/03/2020. CHF, Pacemaker, Aortic Valve Disease and Mitral                 Valve Disease, Arrythmias:Atrial Flutter and Atrial                 Fibrillation; Risk Factors:Diabetes. Chronic kidney disease.                 Aortic Valve: 29 mm Sapien prosthetic, stented (TAVR) valve is                 present in the aortic position. Procedure Date: 05/03/2020.  Sonographer:    Darlina Sicilian RDCS Referring Phys: [id number] [place]  1. Diffuse hypokinesis abnormal septal motion . Left ventricular ejection fraction, by estimation, is 30 to 35%. The left ventricle has moderately decreased function. The left ventricle demonstrates global hypokinesis. The left ventricular internal cavity size was mildly dilated. Left ventricular diastolic parameters are consistent  with Grade II diastolic dysfunction (pseudonormalization). Elevated left ventricular end-diastolic pressure.  2. Pacing wires in RV . Right ventricular systolic function was not well visualized. The right ventricular size is not well visualized. There is mildly elevated pulmonary artery systolic pressure.  3. The mitral valve is abnormal. Mild to moderate mitral valve regurgitation.  4. Post TAVR with 29 mm Sapien 3 valve no significant PVL mean gradient 9 peak 19 mmHg AVA 2.8  cm2 DVI 0.42. The aortic valve has been repaired/replaced. Aortic valve regurgitation is not visualized. There is a 29 mm Sapien prosthetic (TAVR) valve present in the aortic position. Procedure Date: 05/03/2020. FINDINGS  Left Ventricle: Diffuse hypokinesis abnormal septal motion. Left ventricular ejection fraction, by estimation, is 30 to 35%. The left ventricle has moderately decreased function. The left ventricle demonstrates global hypokinesis. The left ventricular internal cavity size was mildly dilated. There is no left ventricular hypertrophy. Left ventricular diastolic parameters are consistent with Grade II diastolic dysfunction (pseudonormalization). Elevated left ventricular end-diastolic pressure. Right Ventricle: Pacing wires in RV. The right ventricular size is not well visualized. Right vetricular wall thickness was not assessed. Right ventricular systolic function was not well visualized. There is mildly elevated pulmonary artery systolic pressure. The tricuspid regurgitant velocity is 3.06 m/s, and with an assumed right atrial pressure of 3 mmHg, the estimated right ventricular systolic pressure is 63.3 mmHg. Left Atrium: Left atrial size was not assessed. Right Atrium: Right atrial size was not assessed. Pericardium: There is no evidence of pericardial effusion. Mitral Valve: The mitral valve is abnormal. There is moderate thickening of the mitral valve leaflet(s). There is mild calcification of the mitral valve leaflet(s). Mild mitral annular calcification. Mild to moderate mitral valve regurgitation. Tricuspid Valve: The tricuspid valve is not assessed. Tricuspid valve regurgitation is mild. Aortic Valve: Post TAVR with 29 mm Sapien 3 valve no significant PVL mean gradient 9 peak 19 mmHg AVA 2.8 cm2 DVI 0.42. The aortic valve has been repaired/replaced. Aortic valve regurgitation is not visualized. Aortic valve mean gradient measures 8.7 mmHg. Aortic valve peak gradient measures 16.9 mmHg.  Aortic valve area, by VTI measures 2.42 cm. There is a 29 mm Sapien prosthetic, stented (TAVR) valve present in the aortic position. Procedure Date: 05/03/2020. Pulmonic Valve: The pulmonic valve was not assessed. Pulmonic valve regurgitation is not visualized. Aorta: Aortic root could not be assessed. IAS/Shunts: The interatrial septum was not assessed.  LEFT VENTRICLE PLAX 2D LVIDd:         5.80 cm  Diastology LVIDs:         4.20 cm  LV e' medial:    4.28 cm/s LV PW:         0.90 cm  LV E/e' medial:  22.5 LV IVS:        0.90 cm  LV e' lateral:   5.48 cm/s LVOT diam:     2.70 cm  LV E/e' lateral: 17.6 LV SV:         81 LV SV Index:   36 LVOT Area:     5.73 cm  AORTIC VALVE AV Area (Vmax):    2.54 cm AV Area (Vmean):   2.77 cm AV Area (VTI):     2.42 cm AV Vmax:           205.67 cm/s AV Vmean:          137.000 cm/s AV VTI:            0.333 m AV Peak Grad:  16.9 mmHg AV Mean Grad:      8.7 mmHg LVOT Vmax:         91.40 cm/s LVOT Vmean:        66.200 cm/s LVOT VTI:          0.141 m LVOT/AV VTI ratio: 0.42 MITRAL VALVE                 TRICUSPID VALVE MV Area (PHT): 5.84 cm      TR Peak grad:   37.5 mmHg MV Decel Time: 130 msec      TR Vmax:        306.00 cm/s MR Peak grad:    60.8 mmHg MR Mean grad:    42.0 mmHg   SHUNTS MR Vmax:         390.00 cm/s Systemic VTI:  0.14 m MR Vmean:        303.0 cm/s  Systemic Diam: 2.70 cm MR PISA:         1.57 cm MR PISA Eff ROA: 13 mm MR PISA Radius:  0.50 cm MV E velocity: 96.40 cm/s MV A velocity: 74.10 cm/s MV E/A ratio:  1.30 Jenkins Rouge MD Electronically signed by Jenkins Rouge MD Signature Date/Time: 05/04/2020/1:51:43 PM    Final      Medications:     Scheduled Medications: . apixaban  5 mg Oral BID  . aspirin  81 mg Oral Daily  . Chlorhexidine Gluconate Cloth  6 each Topical Daily  . dapagliflozin propanediol  10 mg Oral QAC breakfast  . feeding supplement  237 mL Oral TID BM  . insulin aspart  0-9 Units Subcutaneous TID WC  . insulin aspart  2 Units  Subcutaneous TID WC  . insulin glargine  5 Units Subcutaneous QHS  . sodium chloride flush  10-40 mL Intracatheter Q12H    Infusions: . sodium chloride Stopped (05/05/20 0730)    PRN Medications: sodium chloride, acetaminophen, diclofenac Sodium, morphine injection, ondansetron (ZOFRAN) IV, sodium chloride flush, sodium chloride flush, traMADol   Assessment/Plan:   1. 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 Aortic valve also appears severely calcified and moderate-severely stenosed, suspect AS also contributing to CM. Had recent [facility] 9/21 that showed no coronary disease.  -TAVR 2/8 went well, resolution of AS and improvement in EF by 10%, TTE with similar findings also while he was on dobutamine - volume up a bit renal function improved will add torsemide 32m daily, may be able to add on spiro outpatient if bp allows  2. Mitral Regurgitation  - Moderate by TEE 8/21. - Severe by TTE 04/26/20 and consistent with severe functional MR - Improved significantly after TAVR now mild-moderate  3. Aortic Stenosis  - severe low flow/low gradient aortic stenosis with mean gradient 15.5 mmHg, dimensionless index 0.23, AVA 0.86 cm2  -ECHO 2/1 with similar findings mean gradient 21, AVA 0.87 cm2 - s/p successful TAVR procedure 2/8 with resolution of AS  4. Atrial Flutter/ Chronic Atrial Fibrillation - s/p AFL ablation. -In chronic Afib, rate controlled. -Switched to heparin on 2/3 for TAVR procedure, holding eliquis - continue holding bb - Back on eliquis 2/9   5. H/o CHB  - s/p PPM followed  by Dr. Lovena Le  - ? RV paced CM. He is pacing ~40%. - Will need eventual BIV upgrade   6. NASH cirrhosis -followed by Dr.  Jenetta Downer, NAFLD score 2.90 consistent with advanced fibrosis -Elastography ordered but not yet complete -no prior history of bleeding or known varices  7. Stage IIIb CKD -Followed by nephrology. -Baseline SCr ~1.5-1.8 - Cr improved start on oral torsemide today  8. T2DM -On insulin.Hgba1C 6.8 (12/21) -On farxiga - no further hypoglycemia on current basal, bolus, SSI regimen  9. Chest wall hematoma - Uncertain etiology. No underlying rib fracture, no RP hematoma.  - Hgb stable at 9.7, platelets stable 122k.  - Stable appearing, heparin gtt held briefly 2/4 restarted 2/5 hold heparin restart eliquis  10.Swelling around left subclavian access site: -continues to improve, hgb stable  Length of Stay: 11   Katherine Roan MD 05/06/2020, 8:35 AM  Advanced Heart Failure Team Pager [phone] (M-F; [place])  Please contact [facility] for night-coverage after hours (4p -7a ) and weekends on [email or web address]  Patient seen and examined with the above-signed Advanced Practice Provider and/or Housestaff. I personally reviewed laboratory data, imaging studies and relevant notes. I independently examined the patient and formulated the important aspects of the plan. I have edited the note to reflect any of my changes or salient points. I have personally discussed the plan with the patient and/or family.  Doing well s/p TAVR but remains volume overloaded. Also with pain in swelling in RUE  General:  Sitting up in chair No resp difficulty HEENT: normal Neck: supple. JVP to jaw  Carotids 2+ bilat; no bruits. No lymphadenopathy or thryomegaly appreciated. Cor: PMI nondisplaced. Regular rate & rhythm. No rubs, gallops or murmurs. Mild swelling at left subclavian site Lungs: clear Abdomen: soft, nontender, nondistended. No hepatosplenomegaly. No bruits or masses. Good bowel sounds. Extremities: no cyanosis, clubbing, rash, edema 1-2+ edema LUE with painful ROM at shoulder. Radial pulse 2+   Neuro: alert & orientedx3, cranial nerves grossly intact. moves all 4 extremities w/o difficulty. Affect pleasant  Continue diuresis. Will need PT fo ROM exercises in LUE. Check u/s to exclude DVT.   Glori Bickers, MD  2:17 PM

## 2020-05-06 NOTE — Progress Notes (Addendum)
Mobility Specialist - Progress Note   05/06/20 1225  Mobility  Activity Ambulated in hall  Level of Assistance Minimal assist, patient does 75% or more  Assistive Device Front wheel walker  Distance Ambulated (ft) 490 ft (245 ft x 2)  Mobility Response Tolerated well  Mobility performed by Mobility specialist  $Mobility charge 1 Mobility   Pt min assist to elevate trunk when sitting up on edge of bed. Bed height elevated in order to stand He required one standing rest break due to feeling SOB, SpO2 96% at that time. Pt to recliner after walk. HR in 80s throughout.  Pricilla Handler Mobility Specialist Mobility Specialist Phone: [phone]

## 2020-05-07 DIAGNOSIS — Z952 Presence of prosthetic heart valve: Secondary | ICD-10-CM | POA: Diagnosis not present

## 2020-05-07 LAB — GLUCOSE, CAPILLARY
Glucose-Capillary: 133 mg/dL — ABNORMAL HIGH (ref 70–99)
Glucose-Capillary: 207 mg/dL — ABNORMAL HIGH (ref 70–99)
Glucose-Capillary: 180 mg/dL — ABNORMAL HIGH (ref 70–99)
Glucose-Capillary: 185 mg/dL — ABNORMAL HIGH (ref 70–99)

## 2020-05-07 LAB — BASIC METABOLIC PANEL
Anion gap: 7 (ref 5–15)
BUN: 25 mg/dL — ABNORMAL HIGH (ref 8–23)
CO2: 26 mmol/L (ref 22–32)
Calcium: 8.3 mg/dL — ABNORMAL LOW (ref 8.9–10.3)
Chloride: 98 mmol/L (ref 98–111)
Creatinine, Ser: 1.42 mg/dL — ABNORMAL HIGH (ref 0.61–1.24)
GFR, Estimated: 52 mL/min — ABNORMAL LOW (ref >60)
Glucose, Bld: 158 mg/dL — ABNORMAL HIGH (ref 70–99)
Potassium: 4.4 mmol/L (ref 3.5–5.1)
Sodium: 131 mmol/L — ABNORMAL LOW (ref 135–145)

## 2020-05-07 LAB — COOXEMETRY PANEL
Carboxyhemoglobin: 2.8 % — ABNORMAL HIGH (ref 0.5–1.5)
Methemoglobin: 0.9 % (ref 0.0–1.5)
O2 Saturation: 62.3 %
Total hemoglobin: 9.8 g/dL — ABNORMAL LOW (ref 12.0–16.0)

## 2020-05-07 MED ORDER — FUROSEMIDE 10 MG/ML IJ SOLN
80.0000 mg | Freq: Two times a day (BID) | INTRAMUSCULAR | Status: DC
  Administered 2020-05-07 – 2020-05-08 (×2): 80 mg via INTRAVENOUS
  Filled 2020-05-07 (×2): qty 8

## 2020-05-07 NOTE — Progress Notes (Signed)
Physical Therapy Treatment Patient Details Name: John [name] MRN: [id number] DOB: 11/19/1945 Today's Date: 05/07/2020    History of Present Illness Pt admit with chronic heart failure and Afib. s/p TAVR 2/8.    PT Comments    Pt eager to d/c home today, asking when MDs will be rounding. Pt ambulatory in hallway with close guard for safety, requiring some cues for form and safety with RW use. Pt requires light physical assist during sit<>stand from low surface and during step navigation, pt's wife present and will be assisting pt at home. Pt progressing well, and navigated one step which will be required for entry into home. Will continue to follow acutely.    Follow Up Recommendations  Home health PT;Supervision/Assistance - 24 hour     Equipment Recommendations  None recommended by PT    Recommendations for Other Services       Precautions / Restrictions Precautions Precautions: Fall Restrictions Weight Bearing Restrictions: No    Mobility  Bed Mobility Overal bed mobility: Needs Assistance             General bed mobility comments: up in chair pre/post session    Transfers Overall transfer level: Needs assistance Equipment used: Rolling walker (2 wheeled) Transfers: Sit to/from Stand Sit to Stand: Min assist;From elevated surface         General transfer comment: min assist for initial power up, rise, and steady. Verbal cuing for hand placement when rising/sitting.  Ambulation/Gait Ambulation/Gait assistance: Min guard Gait Distance (Feet): 280 Feet Assistive device: Rolling walker (2 wheeled) Gait Pattern/deviations: Step-through pattern;Decreased stride length;Trunk flexed Gait velocity: decr   General Gait Details: min guard for safety, verbal cuing for relaxing shoulders and upright posture. VSS   Stairs Stairs: Yes Stairs assistance: Min assist Stair Management: No rails;Forwards;With walker Number of Stairs: 1 General stair comments: min  assist to steady pt and stabilize RW during ascending step, L rail utilized to descend one step in order to practice single step up.   Wheelchair Mobility    Modified Rankin (Stroke Patients Only)       Balance Overall balance assessment: Needs assistance Sitting-balance support: No upper extremity supported;Feet supported Sitting balance-Leahy Scale: Fair     Standing balance support: No upper extremity supported Standing balance-Leahy Scale: Poor Standing balance comment: reliant on external assist                            Cognition Arousal/Alertness: Awake/alert Behavior During Therapy: WFL for tasks assessed/performed Overall Cognitive Status: Within Functional Limits for tasks assessed                                 General Comments: cues for safety during mobility      Exercises      General Comments        Pertinent Vitals/Pain Pain Assessment: Faces Faces Pain Scale: No hurt Pain Intervention(s): Limited activity within patient's tolerance;Monitored during session    Home Living                      Prior Function            PT Goals (current goals can now be found in the care plan section) Acute Rehab PT Goals Patient Stated Goal: home PT Goal Formulation: With patient/family Time For Goal Achievement: 05/12/20 Potential to Achieve Goals: Good Progress  towards PT goals: Progressing toward goals    Frequency    Min 3X/week      PT Plan Current plan remains appropriate    Co-evaluation              AM-PAC PT "6 Clicks" Mobility   Outcome Measure  Help needed turning from your back to your side while in a flat bed without using bedrails?: None Help needed moving from lying on your back to sitting on the side of a flat bed without using bedrails?: None Help needed moving to and from a bed to a chair (including a wheelchair)?: A Little Help needed standing up from a chair using your arms (e.g.,  wheelchair or bedside chair)?: A Little Help needed to walk in hospital room?: A Little Help needed climbing 3-5 steps with a railing? : A Little 6 Click Score: 20    End of Session   Activity Tolerance: Patient tolerated treatment well Patient left: with call bell/phone within reach;with family/visitor present;in chair (pt received up in chair with no chair alarm) Nurse Communication: Mobility status PT Visit Diagnosis: Muscle weakness (generalized) (M62.81);Unsteadiness on feet (R26.81)     Time: 8453-6468 PT Time Calculation (min) (ACUTE ONLY): 24 min  Charges:  $Gait Training: 23-37 mins                     Stacie Glaze, PT Acute Rehabilitation Services Pager [phone]  Office [phone]   Louis Matte 05/07/2020, 12:49 PM

## 2020-05-07 NOTE — Progress Notes (Addendum)
Patient's condom cath was partially off while siting in chair with leaked urine on chuck in chair.  Condom cath removed and upon removing condom a small abrasion was noted on the anterior shaft of the penis as well as well as the posterior shaft.  On the anterior shaft 3 small ulcers were also noted.  Cleansed penis with soap and water and air dried. Zinc barrier cream applied.

## 2020-05-07 NOTE — Progress Notes (Addendum)
Assessed patient after walking with mobility tech.  Patient has a small scab with scant bleeding on anterior aspect of penile shaft.  Cleaned site again and applied barrier cream with zinc continue to monitor.  Charge nurse Madaline Savage, RN notified as well as Clarene Duke, MD Cardiology Resident.  Careful attention to site performed including peri care cleaning and site assessment performed. Oncoming shift RN and NT will be made aware to monitor site.

## 2020-05-07 NOTE — Progress Notes (Signed)
Mobility Specialist: Progress Note   05/07/20 1614  Mobility  Activity Ambulated in hall  Level of Assistance Moderate assist, patient does 50-74%  Assistive Device Front wheel walker  Distance Ambulated (ft) 470 ft  Mobility Response Tolerated well  Mobility performed by Mobility specialist  $Mobility charge 1 Mobility   Post-Mobility: 80 HR  Pt asx during ambulation. Pt used urinal before ambulation and noticed there was blood on it. Pt bleeding slightly from foreskin, RN notified. Temporary bandage placed to walk. Pt back to bed after walk, RN present to clean up foreskin site.   [facility] Cora Stetson Mobility Specialist Mobility Specialist Phone: [phone]

## 2020-05-07 NOTE — Progress Notes (Signed)
Patient instructed on use of incentive spirometer 500 ml achieved, left arm elevated with ACE wrap, TED hose applied.

## 2020-05-07 NOTE — Progress Notes (Signed)
Patient ID: John [name], male   DOB: January 26, 1946, 75 y.o.   MRN: [id number]    Advanced Heart Failure Rounding Note   Subjective:     S/p TAVR (via left subclavian approach) on 05/03/20    Still with some pain and swelling in left shoulder and arm. U/s 2/11 negative for DVT.   Denies CP or SOB.   CVP 14.    Objective:   Weight Range:  Vital Signs:   Temp:  [97.6 F (36.4 C)-99.1 F (37.3 C)] 97.6 F (36.4 C) (02/12 0827) Pulse Rate:  [60-73] 60 (02/12 0827) Resp:  [14-19] 14 (02/12 0827) BP: (102-120)/(47-62) 120/49 (02/12 0827) SpO2:  [91 %-100 %] 100 % (02/12 0827) Weight:  [103.9 kg] 103.9 kg (02/12 0421) Last BM Date: 05/07/20  Weight change: Filed Weights   05/05/20 0604 05/06/20 0603 05/07/20 0421  Weight: 102.8 kg 103.4 kg 103.9 kg    Intake/Output:   Intake/Output Summary (Last 24 hours) at 05/07/2020 1053 Last data filed at 05/07/2020 0600 Gross per 24 hour  Intake 330 ml  Output 1450 ml  Net -1120 ml     Physical Exam: General:  Sitting in chair. No resp difficulty HEENT: normal Neck: supple. JVP up Carotids 2+ bilat; no bruits. No lymphadenopathy or thryomegaly appreciated. Cor: PMI nondisplaced. Regular rate & rhythm. No rubs, gallops or murmurs. L subclavian site with mild hematoma Lungs: clear Abdomen: soft, nontender, nondistended. No hepatosplenomegaly. No bruits or masses. Good bowel sounds. Extremities: no cyanosis, clubbing, rash, 2+ edema  LUE extremity mild edema. Radial 2+  L shoulder limited ROM due to pain but improving Neuro: alert & orientedx3, cranial nerves grossly intact. moves all 4 extremities w/o difficulty. Affect pleasant   Telemetry: AF 60, RV paced. Occasional PVCs. Personally reviewed   RHC:  RA = 12 RV = 51/11 PA = 52/15 (29) PCW = 29 ( v = 44) Fick cardiac output/index = 4.5/2.0 Thermo CO/CI = 5.2/2.3 PVR = < 1.0 WU Ao sat = 110% PA sat = 64%, 66%  Assessment: 1. Markedly elevated left sided filling  pressures with prominent v-waves in PCWP tracing 2. Moderate reduced cardiac index 3. SBPs in 90s with severe AS  Intraop TEE:  Pre TAVR severe AS with tri leaflet AV no AR men gradient 28 mmHg peak 49 mmHg  in  setting of moderately reduced EF AVA 0.9 cm2.   Post TAVR: well positioned 29 mm Sapien 3 valve Trivial PVL seen around  1:00 on  SA images mean gradient 5 peak 10 mmHg AVA 2.3 cm2.  EF improved 35-40% post procedure  Labs: Basic Metabolic Panel: Recent Labs  Lab 05/02/20 1147 05/03/20 0305 05/03/20 1549 05/03/20 1810 05/04/20 0210 05/05/20 0126 05/06/20 0300 05/07/20 0259  NA  --  131*   < > 136 134* 130* 131* 131*  K  --  3.3*   < > 3.3* 3.3* 4.0 3.7 4.4  CL  --  95*   < > 97* 98 95* 96* 98  CO2  --  25  --   --  26 24 25 26   GLUCOSE  --  128*   < > 118* 101* 181* 191* 158*  BUN  --  20   < > 22 20 26* 25* 25*  CREATININE  --  1.58*   < > 1.40* 1.44* 1.67* 1.47* 1.42*  CALCIUM  --  8.1*  --   --  8.1* 7.8* 8.2* 8.3*  MG 2.6* 2.6*  --   --  2.5*  --   --   --    < > = values in this interval not displayed.    Liver Function Tests: Recent Labs  Lab 05/03/20 0305  AST 28  ALT 15  ALKPHOS 118  BILITOT 4.1*  PROT 6.0*  ALBUMIN 2.3*   No results for input(s): LIPASE, AMYLASE in the last 168 hours. No results for input(s): AMMONIA in the last 168 hours.  CBC: Recent Labs  Lab 05/02/20 0325 05/03/20 0305 05/03/20 1549 05/03/20 1637 05/03/20 1810 05/04/20 0210 05/05/20 0126 05/06/20 0300  WBC 9.3 8.3  --   --   --  8.0 8.7 8.0  HGB 9.8* 9.9*   < > 10.9* 10.9* 9.7* 9.6* 9.4*  HCT 31.2* 30.7*   < > 32.0* 32.0* 30.3* 29.8* 29.4*  MCV 96.0 93.9  --   --   --  94.7 93.4 93.6  PLT 128* 142*  --   --   --  122* 133* 122*   < > = values in this interval not displayed.    Cardiac Enzymes: No results for input(s): CKTOTAL, CKMB, CKMBINDEX, TROPONINI in the last 168 hours.  BNP: BNP (last 3 results) Recent Labs    03/09/20 1544 03/20/20 1253  04/25/20 1157  BNP 1,540.0* 2,072.0* 2,410.2*    ProBNP (last 3 results) No results for input(s): PROBNP in the last 8760 hours.    Other results:  Imaging: VAS [place] UPPER EXTREMITY VENOUS DUPLEX  Result Date: 05/06/2020 UPPER VENOUS STUDY  Indications: Pain and swelling in LT upper extremity. Risk Factors: Surgery 05-03-2020 TAVR LT subclavian. Anticoagulation: Eliquis. Limitations: Limited mobility due to pain. Comparison Study: No prior studies. Performing Technologist: Darlin Coco RDMS  Examination Guidelines: A complete evaluation includes B-mode imaging, spectral Doppler, color Doppler, and power Doppler as needed of all accessible portions of each vessel. Bilateral testing is considered an integral part of a complete examination. Limited examinations for reoccurring indications may be performed as noted.  Right Findings: +----------+------------+---------+-----------+----------+---------------------+ RIGHT     CompressiblePhasicitySpontaneousProperties       Summary        +----------+------------+---------+-----------+----------+---------------------+ Subclavian               No        Yes               Unable to compress                                                       secondary to patient                                                             anatomy        +----------+------------+---------+-----------+----------+---------------------+  Left Findings: +----------+------------+---------+-----------+----------+-------+ LEFT      CompressiblePhasicitySpontaneousPropertiesSummary +----------+------------+---------+-----------+----------+-------+ IJV           Full       No        Yes                      +----------+------------+---------+-----------+----------+-------+ Subclavian    Full  No        Yes                      +----------+------------+---------+-----------+----------+-------+ Axillary      Full       No        Yes                       +----------+------------+---------+-----------+----------+-------+ Brachial      Full                                          +----------+------------+---------+-----------+----------+-------+ Radial        Full                                          +----------+------------+---------+-----------+----------+-------+ Ulnar         Full                                          +----------+------------+---------+-----------+----------+-------+ Cephalic      Full                                          +----------+------------+---------+-----------+----------+-------+ Basilic       Full                                          +----------+------------+---------+-----------+----------+-------+  Summary:  Right: No evidence of thrombosis in the subclavian.  Left: No evidence of deep vein thrombosis in the upper extremity. No evidence of superficial vein thrombosis in the upper extremity.  *See table(s) above for measurements and observations.    Preliminary      Medications:     Scheduled Medications: . apixaban  5 mg Oral BID  . aspirin  81 mg Oral Daily  . Chlorhexidine Gluconate Cloth  6 each Topical Daily  . dapagliflozin propanediol  10 mg Oral QAC breakfast  . feeding supplement  237 mL Oral TID BM  . insulin aspart  0-9 Units Subcutaneous TID WC  . insulin aspart  2 Units Subcutaneous TID WC  . insulin glargine  5 Units Subcutaneous QHS  . sodium chloride flush  10-40 mL Intracatheter Q12H  . torsemide  40 mg Oral Daily    Infusions: . sodium chloride 1,000 mL (05/06/20 1528)    PRN Medications: sodium chloride, acetaminophen, diclofenac Sodium, morphine injection, ondansetron (ZOFRAN) IV, sodium chloride flush, sodium chloride flush, traMADol   Assessment/Plan:   1. Acute on Chronic Combined Systolic and Diastolic Heart Failure/NICM: - Echo 08/2015 EF 50-55% (PPM placed in 08/2015)  - Echo 10/2015 EF dropped to 40-45% -  Echo 2018 EF 45% - Echo 2021 EF 30-35%, RV mildly reduced  - Based on timing of drop in EF and 42% pacing percentage, concern for RV pacing-triggered CM. Aortic valve also appears severely calcified and moderate-severely stenosed, suspect AS also contributing to CM. Had recent [facility] 9/21 that showed no coronary disease.  -TAVR 2/8  went well, resolution of AS and improvement in EF by 10%, TTE with similar findings also while he was on dobutamine - CVP remains elevated. Resume IV lasix   2. Mitral Regurgitation  - Moderate by TEE 8/21. - Severe by TTE 04/26/20 and consistent with severe functional MR - Improved significantly after TAVR now mild-moderate  3. Aortic Stenosis  - severe low flow/low gradient aortic stenosis with mean gradient 15.5 mmHg, dimensionless index 0.23, AVA 0.86 cm2  -ECHO 2/1 with similar findings mean gradient 21, AVA 0.87 cm2 - s/p successful TAVR procedure 2/8 with resolution of AS  4. Atrial Flutter/ Chronic Atrial Fibrillation - s/p AFL ablation. -In chronic Afib, rate controlled. -Switched to heparin on 2/3 for TAVR procedure, holding eliquis - continue holding bb - Back on eliquis 2/9   5. H/o CHB  - s/p PPM followed by Dr. Lovena Le  - ? RV paced CM. He is pacing ~40%. - Will need eventual BIV upgrade   6. NASH cirrhosis -followed by Dr. Jenetta Downer, NAFLD score 2.90 consistent with advanced fibrosis -Elastography ordered but not yet complete -no prior history of bleeding or known varices  7. Stage IIIb CKD -Followed by nephrology. -Baseline SCr ~1.5-1.8 - Cr stable 1.4 today  8. T2DM -On insulin.Hgba1C 6.8 (12/21) -On farxiga - no further hypoglycemia on current basal, bolus, SSI regimen  9. Chest wall hematoma - Uncertain etiology. No underlying rib fracture, no RP hematoma.  - Hgb stable at 9.8  10.Swelling around left subclavian access site: - LUE u/s 2/11 No DVT. Good pulses. No evidence of compartment syndrome  - Wrap  RUE  Length of Stay: 12   Glori Bickers MD 05/07/2020, 10:53 AM  Advanced Heart Failure Team Pager [phone] (M-F; 7a - 4p)  Please contact [facility] for night-coverage after hours (4p -7a ) and weekends on [email or web address]

## 2020-05-08 DIAGNOSIS — N1832 Chronic kidney disease, stage 3b: Secondary | ICD-10-CM | POA: Diagnosis not present

## 2020-05-08 DIAGNOSIS — I350 Nonrheumatic aortic (valve) stenosis: Secondary | ICD-10-CM | POA: Diagnosis not present

## 2020-05-08 DIAGNOSIS — I5023 Acute on chronic systolic (congestive) heart failure: Secondary | ICD-10-CM | POA: Diagnosis not present

## 2020-05-08 DIAGNOSIS — I482 Chronic atrial fibrillation, unspecified: Secondary | ICD-10-CM | POA: Diagnosis not present

## 2020-05-08 LAB — BASIC METABOLIC PANEL
Anion gap: 9 (ref 5–15)
BUN: 25 mg/dL — ABNORMAL HIGH (ref 8–23)
CO2: 26 mmol/L (ref 22–32)
Calcium: 8.5 mg/dL — ABNORMAL LOW (ref 8.9–10.3)
Chloride: 96 mmol/L — ABNORMAL LOW (ref 98–111)
Creatinine, Ser: 1.42 mg/dL — ABNORMAL HIGH (ref 0.61–1.24)
GFR, Estimated: 52 mL/min — ABNORMAL LOW (ref >60)
Glucose, Bld: 165 mg/dL — ABNORMAL HIGH (ref 70–99)
Potassium: 4.5 mmol/L (ref 3.5–5.1)
Sodium: 131 mmol/L — ABNORMAL LOW (ref 135–145)

## 2020-05-08 LAB — GLUCOSE, CAPILLARY
Glucose-Capillary: 178 mg/dL — ABNORMAL HIGH (ref 70–99)
Glucose-Capillary: 181 mg/dL — ABNORMAL HIGH (ref 70–99)
Glucose-Capillary: 184 mg/dL — ABNORMAL HIGH (ref 70–99)
Glucose-Capillary: 177 mg/dL — ABNORMAL HIGH (ref 70–99)

## 2020-05-08 LAB — COOXEMETRY PANEL
Carboxyhemoglobin: 2.3 % — ABNORMAL HIGH (ref 0.5–1.5)
Methemoglobin: 0.8 % (ref 0.0–1.5)
O2 Saturation: 52.1 %
Total hemoglobin: 10.3 g/dL — ABNORMAL LOW (ref 12.0–16.0)

## 2020-05-08 MED ORDER — FUROSEMIDE 10 MG/ML IJ SOLN
120.0000 mg | Freq: Two times a day (BID) | INTRAVENOUS | Status: DC
  Administered 2020-05-08 – 2020-05-09 (×2): 120 mg via INTRAVENOUS
  Filled 2020-05-08: qty 12
  Filled 2020-05-08 (×2): qty 10
  Filled 2020-05-08 (×2): qty 12

## 2020-05-08 MED ORDER — METOLAZONE 5 MG PO TABS
5.0000 mg | ORAL_TABLET | Freq: Once | ORAL | Status: AC
  Administered 2020-05-08: 5 mg via ORAL
  Filled 2020-05-08: qty 1

## 2020-05-08 NOTE — Progress Notes (Signed)
Mobility Specialist: Progress Note   05/08/20 1402  Mobility  Activity Ambulated in hall  Level of Assistance Moderate assist, patient does 50-74%  Assistive Device Front wheel walker  Distance Ambulated (ft) 470 ft  Mobility Response Tolerated well  Mobility performed by Mobility specialist  Bed Position Chair  $Mobility charge 1 Mobility   Pre-Mobility: 60 HR, 114/53 BP, 97% SpO2 Post-Mobility: 78 HR, 132/68 BP, 93% SpO2  Pt c/o 6-7/10 pain in both feet during ambulation, RN notified. Pt otherwise asx. Pt to chair after walk per request.   Harrell Gave John [name] Mobility Specialist Mobility Specialist Phone: [phone]

## 2020-05-08 NOTE — Progress Notes (Signed)
Patient ID: John [name], male   DOB: 19-May-1945, 75 y.o.   MRN: [id number]    Advanced Heart Failure Rounding Note   Subjective:     S/p TAVR (via left subclavian approach) on 05/03/20    Feeling much better today. Left arm and shoulder much less sore.  U/s 2/11 negative for DVT.   IV lasix restarted yesterday. Modest urine output. Weight unchanged. CVP still 16-17   Objective:   Weight Range:  Vital Signs:   Temp:  [97.7 F (36.5 C)-98.6 F (37 C)] 98.3 F (36.8 C) (02/13 0814) Pulse Rate:  [60-68] 68 (02/13 0814) Resp:  [12-20] 20 (02/13 0814) BP: (99-118)/(47-61) 107/58 (02/13 0814) SpO2:  [96 %-99 %] 98 % (02/13 0814) Weight:  [104 kg] 104 kg (02/13 0500) Last BM Date: 05/07/20  Weight change: Filed Weights   05/06/20 0603 05/07/20 0421 05/08/20 0500  Weight: 103.4 kg 103.9 kg 104 kg    Intake/Output:   Intake/Output Summary (Last 24 hours) at 05/08/2020 1144 Last data filed at 05/08/2020 1038 Gross per 24 hour  Intake 747.93 ml  Output 1400 ml  Net -652.07 ml     Physical Exam: General:  Lying in bed . No resp difficulty HEENT: normal Neck: supple. JVP to jaw. Carotids 2+ bilat; no bruits. No lymphadenopathy or thryomegaly appreciated. Cor: PMI nondisplaced. Regular rate & rhythm. No rubs, gallops or murmurs. Lungs: clear Abdomen: soft, nontender, nondistended. No hepatosplenomegaly. No bruits or masses. Good bowel sounds. Extremities: no cyanosis, clubbing, rash, 1+ edema  LUE less swollen. wrapped Neuro: alert & orientedx3, cranial nerves grossly intact. moves all 4 extremities w/o difficulty. Affect pleasant   Telemetry: AF with RV pacing 60 + PVCs. Personally reviewed   RHC:  RA = 12 RV = 51/11 PA = 52/15 (29) PCW = 29 ( v = 44) Fick cardiac output/index = 4.5/2.0 Thermo CO/CI = 5.2/2.3 PVR = < 1.0 WU Ao sat = 110% PA sat = 64%, 66%  Assessment: 1. Markedly elevated left sided filling pressures with prominent v-waves in PCWP  tracing 2. Moderate reduced cardiac index 3. SBPs in 90s with severe AS  Intraop TEE:  Pre TAVR severe AS with tri leaflet AV no AR men gradient 28 mmHg peak 49 mmHg  in  setting of moderately reduced EF AVA 0.9 cm2.   Post TAVR: well positioned 29 mm Sapien 3 valve Trivial PVL seen around  1:00 on  SA images mean gradient 5 peak 10 mmHg AVA 2.3 cm2.  EF improved 35-40% post procedure  Labs: Basic Metabolic Panel: Recent Labs  Lab 05/02/20 1147 05/03/20 0305 05/03/20 1549 05/04/20 0210 05/05/20 0126 05/06/20 0300 05/07/20 0259 05/08/20 0518  NA  --  131*   < > 134* 130* 131* 131* 131*  K  --  3.3*   < > 3.3* 4.0 3.7 4.4 4.5  CL  --  95*   < > 98 95* 96* 98 96*  CO2  --  25  --  26 24 25 26 26   GLUCOSE  --  128*   < > 101* 181* 191* 158* 165*  BUN  --  20   < > 20 26* 25* 25* 25*  CREATININE  --  1.58*   < > 1.44* 1.67* 1.47* 1.42* 1.42*  CALCIUM  --  8.1*  --  8.1* 7.8* 8.2* 8.3* 8.5*  MG 2.6* 2.6*  --  2.5*  --   --   --   --    < > =  values in this interval not displayed.    Liver Function Tests: Recent Labs  Lab 05/03/20 0305  AST 28  ALT 15  ALKPHOS 118  BILITOT 4.1*  PROT 6.0*  ALBUMIN 2.3*   No results for input(s): LIPASE, AMYLASE in the last 168 hours. No results for input(s): AMMONIA in the last 168 hours.  CBC: Recent Labs  Lab 05/02/20 0325 05/03/20 0305 05/03/20 1549 05/03/20 1637 05/03/20 1810 05/04/20 0210 05/05/20 0126 05/06/20 0300  WBC 9.3 8.3  --   --   --  8.0 8.7 8.0  HGB 9.8* 9.9*   < > 10.9* 10.9* 9.7* 9.6* 9.4*  HCT 31.2* 30.7*   < > 32.0* 32.0* 30.3* 29.8* 29.4*  MCV 96.0 93.9  --   --   --  94.7 93.4 93.6  PLT 128* 142*  --   --   --  122* 133* 122*   < > = values in this interval not displayed.    Cardiac Enzymes: No results for input(s): CKTOTAL, CKMB, CKMBINDEX, TROPONINI in the last 168 hours.  BNP: BNP (last 3 results) Recent Labs    03/09/20 1544 03/20/20 1253 04/25/20 1157  BNP 1,540.0* 2,072.0* 2,410.2*     ProBNP (last 3 results) No results for input(s): PROBNP in the last 8760 hours.    Other results:  Imaging: VAS [place] UPPER EXTREMITY VENOUS DUPLEX  Result Date: 05/07/2020 UPPER VENOUS STUDY  Indications: Pain and swelling in LT upper extremity. Risk Factors: Surgery 05-03-2020 TAVR LT subclavian. Anticoagulation: Eliquis. Limitations: Limited mobility due to pain. Comparison Study: No prior studies. Performing Technologist: Darlin Coco RDMS  Examination Guidelines: A complete evaluation includes B-mode imaging, spectral Doppler, color Doppler, and power Doppler as needed of all accessible portions of each vessel. Bilateral testing is considered an integral part of a complete examination. Limited examinations for reoccurring indications may be performed as noted.  Right Findings: +----------+------------+---------+-----------+----------+---------------------+ RIGHT     CompressiblePhasicitySpontaneousProperties       Summary        +----------+------------+---------+-----------+----------+---------------------+ Subclavian               No        Yes               Unable to compress                                                       secondary to patient                                                             anatomy        +----------+------------+---------+-----------+----------+---------------------+  Left Findings: +----------+------------+---------+-----------+----------+-------+ LEFT      CompressiblePhasicitySpontaneousPropertiesSummary +----------+------------+---------+-----------+----------+-------+ IJV           Full       No        Yes                      +----------+------------+---------+-----------+----------+-------+ Subclavian    Full       No        Yes                      +----------+------------+---------+-----------+----------+-------+  Axillary      Full       No        Yes                       +----------+------------+---------+-----------+----------+-------+ Brachial      Full                                          +----------+------------+---------+-----------+----------+-------+ Radial        Full                                          +----------+------------+---------+-----------+----------+-------+ Ulnar         Full                                          +----------+------------+---------+-----------+----------+-------+ Cephalic      Full                                          +----------+------------+---------+-----------+----------+-------+ Basilic       Full                                          +----------+------------+---------+-----------+----------+-------+  Summary:  Right: No evidence of thrombosis in the subclavian.  Left: No evidence of deep vein thrombosis in the upper extremity. No evidence of superficial vein thrombosis in the upper extremity.  *See table(s) above for measurements and observations.  Diagnosing physician: Harold Barban MD Electronically signed by Harold Barban MD on 05/07/2020 at 8:00:36 PM.    Final      Medications:     Scheduled Medications: . apixaban  5 mg Oral BID  . aspirin  81 mg Oral Daily  . Chlorhexidine Gluconate Cloth  6 each Topical Daily  . dapagliflozin propanediol  10 mg Oral QAC breakfast  . feeding supplement  237 mL Oral TID BM  . furosemide  80 mg Intravenous BID  . insulin aspart  0-9 Units Subcutaneous TID WC  . insulin aspart  2 Units Subcutaneous TID WC  . insulin glargine  5 Units Subcutaneous QHS  . sodium chloride flush  10-40 mL Intracatheter Q12H    Infusions: . sodium chloride Stopped (05/07/20 1105)    PRN Medications: sodium chloride, acetaminophen, diclofenac Sodium, morphine injection, ondansetron (ZOFRAN) IV, sodium chloride flush, sodium chloride flush, traMADol   Assessment/Plan:   1. Acute on Chronic Combined Systolic and Diastolic Heart Failure/NICM: -  Echo 08/2015 EF 50-55% (PPM placed in 08/2015)  - Echo 10/2015 EF dropped to 40-45% - Echo 2018 EF 45% - Echo 2021 EF 30-35%, RV mildly reduced  - Based on timing of drop in EF and 42% pacing percentage, concern for RV pacing-triggered CM. Aortic valve also appears severely calcified and moderate-severely stenosed, suspect AS also contributing to CM. Had recent [facility] 9/21 that showed no coronary disease.  -TAVR 2/8 went well, resolution of AS and improvement in EF by 10%, TTE with similar findings  also while he was on dobutamine - Off DBA. Co-ox marginal at 52%. Follow closely  - CVP remains elevated. Not responding well to IV lasix. Increase lasix to 120 IV bid give metolazone 5.   2. Mitral Regurgitation  - Moderate by TEE 8/21. - Severe by TTE 04/26/20 and consistent with severe functional MR - Improved significantly after TAVR now mild-moderate  3. Aortic Stenosis  - severe low flow/low gradient aortic stenosis with mean gradient 15.5 mmHg, dimensionless index 0.23, AVA 0.86 cm2  -ECHO 2/1 with similar findings mean gradient 21, AVA 0.87 cm2 - s/p successful TAVR procedure 2/8 with resolution of AS - no change  4. Atrial Flutter/ Chronic Atrial Fibrillation - s/p AFL ablation. -In chronic Afib, rate controlled. -Switched to heparin on 2/3 for TAVR procedure, holding eliquis - continue holding bb - Back on eliquis 2/9. No bleeding   5. H/o CHB  - s/p PPM followed by Dr. Lovena Le  - ? RV paced CM. He is pacing ~40%. - Will need eventual BIV upgrade   6. NASH cirrhosis -followed by Dr. Jenetta Downer, NAFLD score 2.90 consistent with advanced fibrosis -Elastography ordered but not yet complete -no prior history of bleeding or known varices  7. Stage IIIb CKD -Followed by nephrology. -Baseline SCr ~1.5-1.8 - Cr stable at 1.4 today - Follow with diuresis  8. T2DM -On insulin.Hgba1C 6.8 (12/21) -On farxiga - no further hypoglycemia on current basal, bolus, SSI  regimen  9. Chest wall hematoma - Uncertain etiology. No underlying rib fracture, no RP hematoma.  - Hgb stable at 9.8  10.Swelling around left subclavian access site: - LUE u/s 2/11 No DVT. Good pulses. No evidence of compartment syndrome  - Improving. Continue elevation and wraps.   Length of Stay: 13   Glori Bickers MD 05/08/2020, 11:44 AM  Advanced Heart Failure Team Pager [phone] (M-F; [place])  Please contact [facility] for night-coverage after hours (4p -7a ) and weekends on [email or web address]

## 2020-05-08 NOTE — Progress Notes (Signed)
Patient has no pain today except for mild pain with raising his left arm, but he says "it is a whole lot better".  Patient's small scab on penile shaft is also visibly healing and is clean and dry.  Patient also reports that this is "a whole lot better today".  Continue peri care and shift monitoring.

## 2020-05-09 DIAGNOSIS — N1832 Chronic kidney disease, stage 3b: Secondary | ICD-10-CM | POA: Diagnosis not present

## 2020-05-09 DIAGNOSIS — I5023 Acute on chronic systolic (congestive) heart failure: Secondary | ICD-10-CM | POA: Diagnosis not present

## 2020-05-09 DIAGNOSIS — I482 Chronic atrial fibrillation, unspecified: Secondary | ICD-10-CM | POA: Diagnosis not present

## 2020-05-09 DIAGNOSIS — I350 Nonrheumatic aortic (valve) stenosis: Secondary | ICD-10-CM | POA: Diagnosis not present

## 2020-05-09 LAB — CBC
HCT: 30.5 % — ABNORMAL LOW (ref 39.0–52.0)
Hemoglobin: 9.8 g/dL — ABNORMAL LOW (ref 13.0–17.0)
MCH: 30.1 pg (ref 26.0–34.0)
MCHC: 32.1 g/dL (ref 30.0–36.0)
MCV: 93.6 fL (ref 80.0–100.0)
Platelets: 156 10*3/uL (ref 150–400)
RBC: 3.26 MIL/uL — ABNORMAL LOW (ref 4.22–5.81)
RDW: 18.2 % — ABNORMAL HIGH (ref 11.5–15.5)
WBC: 8.9 10*3/uL (ref 4.0–10.5)
nRBC: 0 % (ref 0.0–0.2)

## 2020-05-09 LAB — BASIC METABOLIC PANEL
Anion gap: 10 (ref 5–15)
BUN: 26 mg/dL — ABNORMAL HIGH (ref 8–23)
CO2: 28 mmol/L (ref 22–32)
Calcium: 8.6 mg/dL — ABNORMAL LOW (ref 8.9–10.3)
Chloride: 93 mmol/L — ABNORMAL LOW (ref 98–111)
Creatinine, Ser: 1.29 mg/dL — ABNORMAL HIGH (ref 0.61–1.24)
GFR, Estimated: 58 mL/min — ABNORMAL LOW (ref >60)
Glucose, Bld: 154 mg/dL — ABNORMAL HIGH (ref 70–99)
Potassium: 3.4 mmol/L — ABNORMAL LOW (ref 3.5–5.1)
Sodium: 131 mmol/L — ABNORMAL LOW (ref 135–145)

## 2020-05-09 LAB — COOXEMETRY PANEL
Carboxyhemoglobin: 2.7 % — ABNORMAL HIGH (ref 0.5–1.5)
Methemoglobin: 0.9 % (ref 0.0–1.5)
O2 Saturation: 62.9 %
Total hemoglobin: 10.5 g/dL — ABNORMAL LOW (ref 12.0–16.0)

## 2020-05-09 LAB — GLUCOSE, CAPILLARY
Glucose-Capillary: 171 mg/dL — ABNORMAL HIGH (ref 70–99)
Glucose-Capillary: 192 mg/dL — ABNORMAL HIGH (ref 70–99)
Glucose-Capillary: 210 mg/dL — ABNORMAL HIGH (ref 70–99)
Glucose-Capillary: 154 mg/dL — ABNORMAL HIGH (ref 70–99)

## 2020-05-09 LAB — URIC ACID: Uric Acid, Serum: 10 mg/dL — ABNORMAL HIGH (ref 3.7–8.6)

## 2020-05-09 MED ORDER — FUROSEMIDE 10 MG/ML IJ SOLN
22.0000 mg/h | INTRAVENOUS | Status: DC
  Administered 2020-05-09: 20 mg/h via INTRAVENOUS
  Filled 2020-05-09 (×3): qty 20

## 2020-05-09 MED ORDER — POTASSIUM CHLORIDE CRYS ER 20 MEQ PO TBCR
20.0000 meq | EXTENDED_RELEASE_TABLET | Freq: Once | ORAL | Status: AC
  Administered 2020-05-09: 20 meq via ORAL
  Filled 2020-05-09: qty 1

## 2020-05-09 MED ORDER — POTASSIUM CHLORIDE CRYS ER 20 MEQ PO TBCR
40.0000 meq | EXTENDED_RELEASE_TABLET | ORAL | Status: AC
  Administered 2020-05-09: 40 meq via ORAL
  Filled 2020-05-09: qty 2

## 2020-05-09 NOTE — Progress Notes (Signed)
PT Cancellation Note  Patient Details Name: John [name] MRN: [id number] DOB: 12-20-1945   Cancelled Treatment:    Reason Eval/Treat Not Completed: (P) Patient declined, no reason specified;Other (comment) Pt just got back in bed from sitting up in chair >2 hours, ambulated with mobility NA ~1pm and too fatigued at this time to walk again. Will continue efforts next date per PT POC as schedule permits.   Mistie Adney M Raimi Guillermo 05/09/2020, 4:30 PM

## 2020-05-09 NOTE — Progress Notes (Signed)
Patient ID: John [name], male   DOB: December 25, 1945, 75 y.o.   MRN: [id number]    Advanced Heart Failure Rounding Note   Subjective:     S/p TAVR (via left subclavian approach) on 05/03/20    Still having some elbow and now knee pain has come back again.  He denies dyspnea or chest pain.  Better diuresis with increased lasix and addition of metolazone. CVP still 13-15     Objective:   Weight Range:  Vital Signs:   Temp:  [97.9 F (36.6 C)-98.5 F (36.9 C)] 98.4 F (36.9 C) (02/14 0325) Pulse Rate:  [60-81] 60 (02/14 0325) Resp:  [16-20] 16 (02/14 0700) BP: (107-111)/(49-59) 108/54 (02/14 0325) SpO2:  [100 %] 100 % (02/13 2338) Weight:  [103.4 kg] 103.4 kg (02/14 0700) Last BM Date: 04/27/20  Weight change: Filed Weights   05/07/20 0421 05/08/20 0500 05/09/20 0700  Weight: 103.9 kg 104 kg 103.4 kg    Intake/Output:   Intake/Output Summary (Last 24 hours) at 05/09/2020 0824 Last data filed at 05/09/2020 0600 Gross per 24 hour  Intake 460.65 ml  Output 1790 ml  Net -1329.35 ml     Physical Exam: Cardiac: JVD 13, normal rate and rhythm, no murmurs, rubs or gallops, 1+ LE edema Pulmonary: CTAB, not in distress Abdominal: non distended abdomen, soft and nontender Psych: Alert, conversant, in good spirits    Telemetry: AF with RV pacing 60 + PVCs. Personally reviewed   RHC:  RA = 12 RV = 51/11 PA = 52/15 (29) PCW = 29 ( v = 44) Fick cardiac output/index = 4.5/2.0 Thermo CO/CI = 5.2/2.3 PVR = < 1.0 WU Ao sat = 110% PA sat = 64%, 66%  Assessment: 1. Markedly elevated left sided filling pressures with prominent v-waves in PCWP tracing 2. Moderate reduced cardiac index 3. SBPs in 90s with severe AS  Intraop TEE:  Pre TAVR severe AS with tri leaflet AV no AR men gradient 28 mmHg peak 49 mmHg  in  setting of moderately reduced EF AVA 0.9 cm2.   Post TAVR: well positioned 29 mm Sapien 3 valve Trivial PVL seen around  1:00 on  SA images mean gradient 5  peak 10 mmHg AVA 2.3 cm2.  EF improved 35-40% post procedure  Labs: Basic Metabolic Panel: Recent Labs  Lab  0000 05/02/20 1147 05/03/20 0305 05/03/20 1549 05/04/20 0210 05/05/20 0126 05/06/20 0300 05/07/20 0259 05/08/20 0518  NA  --   --  131*   < > 134* 130* 131* 131* 131*  K  --   --  3.3*   < > 3.3* 4.0 3.7 4.4 4.5  CL  --   --  95*   < > 98 95* 96* 98 96*  CO2   < >  --  25  --  26 24 25 26 26   GLUCOSE  --   --  128*   < > 101* 181* 191* 158* 165*  BUN  --   --  20   < > 20 26* 25* 25* 25*  CREATININE  --   --  1.58*   < > 1.44* 1.67* 1.47* 1.42* 1.42*  CALCIUM   < >  --  8.1*  --  8.1* 7.8* 8.2* 8.3* 8.5*  MG  --  2.6* 2.6*  --  2.5*  --   --   --   --    < > = values in this interval not displayed.    Liver Function  Tests: Recent Labs  Lab 05/03/20 0305  AST 28  ALT 15  ALKPHOS 118  BILITOT 4.1*  PROT 6.0*  ALBUMIN 2.3*   No results for input(s): LIPASE, AMYLASE in the last 168 hours. No results for input(s): AMMONIA in the last 168 hours.  CBC: Recent Labs  Lab 05/03/20 0305 05/03/20 1549 05/03/20 1637 05/03/20 1810 05/04/20 0210 05/05/20 0126 05/06/20 0300  WBC 8.3  --   --   --  8.0 8.7 8.0  HGB 9.9*   < > 10.9* 10.9* 9.7* 9.6* 9.4*  HCT 30.7*   < > 32.0* 32.0* 30.3* 29.8* 29.4*  MCV 93.9  --   --   --  94.7 93.4 93.6  PLT 142*  --   --   --  122* 133* 122*   < > = values in this interval not displayed.    Cardiac Enzymes: No results for input(s): CKTOTAL, CKMB, CKMBINDEX, TROPONINI in the last 168 hours.  BNP: BNP (last 3 results) Recent Labs    03/09/20 1544 03/20/20 1253 04/25/20 1157  BNP 1,540.0* 2,072.0* 2,410.2*    ProBNP (last 3 results) No results for input(s): PROBNP in the last 8760 hours.    Other results:  Imaging: No results found.   Medications:     Scheduled Medications: . apixaban  5 mg Oral BID  . aspirin  81 mg Oral Daily  . Chlorhexidine Gluconate Cloth  6 each Topical Daily  . dapagliflozin  propanediol  10 mg Oral QAC breakfast  . feeding supplement  237 mL Oral TID BM  . insulin aspart  0-9 Units Subcutaneous TID WC  . insulin aspart  2 Units Subcutaneous TID WC  . insulin glargine  5 Units Subcutaneous QHS  . sodium chloride flush  10-40 mL Intracatheter Q12H    Infusions: . sodium chloride Stopped (05/07/20 1105)  . furosemide 62 mL/hr at 05/08/20 1800    PRN Medications: sodium chloride, acetaminophen, diclofenac Sodium, morphine injection, ondansetron (ZOFRAN) IV, sodium chloride flush, sodium chloride flush, traMADol   Assessment/Plan:   1. 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 Aortic valve also appears severely calcified and moderate-severely stenosed, suspect AS also contributing to CM. Had recent [facility] 9/21 that showed no coronary disease.  -TAVR 2/8 went well, resolution of AS and improvement in EF by 10%, TTE with similar findings also while he was on dobutamine - Off DBA. Co-ox at 63%. Follow closely  - CVP remains elevated. Diuresis better with increased lasix to 120 IV bid and metolazone 5 yesterday weight down 1kg,    2. Mitral Regurgitation  - Moderate by TEE 8/21. - Severe by TTE 04/26/20 and consistent with severe functional MR - Improved significantly after TAVR now mild-moderate  3. Aortic Stenosis  - severe low flow/low gradient aortic stenosis with mean gradient 15.5 mmHg, dimensionless index 0.23, AVA 0.86 cm2  -ECHO 2/1 with similar findings mean gradient 21, AVA 0.87 cm2 - s/p successful TAVR procedure 2/8 with resolution of AS  4. Atrial Flutter/ Chronic Atrial Fibrillation - s/p AFL ablation. -In chronic Afib, rate controlled. -Switched to heparin on 2/3 for TAVR  procedure, holding eliquis - continue holding bb - Back on eliquis 2/9.  No bleeding   5. H/o CHB  - s/p PPM followed by Dr. Lovena Le  - ? RV paced CM. He is pacing ~40%. - Will need eventual BIV upgrade   6. NASH cirrhosis -followed by Dr. Jenetta Downer, NAFLD score 2.90 consistent with advanced fibrosis -Elastography ordered but not yet complete -no prior history of bleeding or known varices  7. Stage IIIb CKD -Followed by nephrology. -Baseline SCr ~1.5-1.8 - Cr improved to 1.29 today - Follow with diuresis  8. T2DM -On insulin.Hgba1C 6.8 (12/21) -On farxiga - no further hypoglycemia on current basal, bolus, SSI regimen  9. Chest wall hematoma - Uncertain etiology. No underlying rib fracture, no RP hematoma.  - Hgb stable at 9.8  10.Swelling around left subclavian access site: - LUE u/s 2/11 No DVT. Good pulses. No evidence of compartment syndrome  - Improving. Continue elevation and wraps.   Length of Stay: 14   Katherine Roan MD 05/09/2020, 8:24 AM  Advanced Heart Failure Team Pager [phone] (M-F; 7a - 4p)  Please contact [facility] for night-coverage after hours (4p -7a ) and weekends on [email or web address]  Patient seen and examined with the above-signed Advanced Practice Provider and/or Housestaff. I personally reviewed laboratory data, imaging studies and relevant notes. I independently examined the patient and formulated the important aspects of the plan. I have edited the note to reflect any of my changes or salient points. I have personally discussed the plan with the patient and/or family.  Lasix increased to 120 IV bid and metolazone added. Still edematous. Left knee and foot sore with movement.   General: Lying in bed . No resp difficulty HEENT: normal Neck: supple. JVP to ear Carotids 2+ bilat; no bruits. No lymphadenopathy or thryomegaly appreciated. Cor: PMI nondisplaced. Regular rate & rhythm. No rubs, gallops or murmurs. Lungs: clear Abdomen:  soft, nontender, nondistended. No hepatosplenomegaly. No bruits or masses. Good bowel sounds. Extremities: no cyanosis, clubbing, rash, 1+ edema L knee painful ROM  Neuro: alert & orientedx3, cranial nerves grossly intact. moves all 4 extremities w/o difficulty. Affect pleasant  Remains volume overload. Will switch to lasix gtt. Suspect he may have gout. Check uric acid. If elevated will start steroids.   Glori Bickers, MD  5:12 PM

## 2020-05-09 NOTE — Care Management Important Message (Signed)
Important Message  Patient Details  Name: John [name] MRN: [id number] Date of Birth: 07-Apr-1945   Medicare Important Message Given:  Yes     Shelda Altes 05/09/2020, 10:24 AM

## 2020-05-09 NOTE — Progress Notes (Signed)
Mobility Specialist - Progress Note   05/09/20 1226  Mobility  Activity Ambulated in hall  Level of Assistance Minimal assist, patient does 75% or more  Assistive Device Front wheel walker  Distance Ambulated (ft) 260 ft  Mobility Response Tolerated well  Mobility performed by Mobility specialist  $Mobility charge 1 Mobility   Pre-mobility: 68 HR During mobility: 85 HR Post-mobility: 70 HR  Pt min assist to rock-to-stand from bed, standby during ambulation. Distance limited by L heel pain he rated a 9/10, RN notified of pain. Pt to recliner after walk to eat lunch.   Pricilla Handler Mobility Specialist Mobility Specialist Phone: [phone]

## 2020-05-10 DIAGNOSIS — N1832 Chronic kidney disease, stage 3b: Secondary | ICD-10-CM | POA: Diagnosis not present

## 2020-05-10 DIAGNOSIS — I5023 Acute on chronic systolic (congestive) heart failure: Secondary | ICD-10-CM | POA: Diagnosis not present

## 2020-05-10 DIAGNOSIS — I350 Nonrheumatic aortic (valve) stenosis: Secondary | ICD-10-CM | POA: Diagnosis not present

## 2020-05-10 DIAGNOSIS — I482 Chronic atrial fibrillation, unspecified: Secondary | ICD-10-CM | POA: Diagnosis not present

## 2020-05-10 LAB — CBC
HCT: 30.4 % — ABNORMAL LOW (ref 39.0–52.0)
Hemoglobin: 10.3 g/dL — ABNORMAL LOW (ref 13.0–17.0)
MCH: 30.9 pg (ref 26.0–34.0)
MCHC: 33.9 g/dL (ref 30.0–36.0)
MCV: 91.3 fL (ref 80.0–100.0)
Platelets: 172 10*3/uL (ref 150–400)
RBC: 3.33 MIL/uL — ABNORMAL LOW (ref 4.22–5.81)
RDW: 17.8 % — ABNORMAL HIGH (ref 11.5–15.5)
WBC: 9.3 10*3/uL (ref 4.0–10.5)
nRBC: 0 % (ref 0.0–0.2)

## 2020-05-10 LAB — BASIC METABOLIC PANEL
Anion gap: 10 (ref 5–15)
BUN: 24 mg/dL — ABNORMAL HIGH (ref 8–23)
CO2: 29 mmol/L (ref 22–32)
Calcium: 8.7 mg/dL — ABNORMAL LOW (ref 8.9–10.3)
Chloride: 92 mmol/L — ABNORMAL LOW (ref 98–111)
Creatinine, Ser: 1.26 mg/dL — ABNORMAL HIGH (ref 0.61–1.24)
GFR, Estimated: 60 mL/min — ABNORMAL LOW (ref >60)
Glucose, Bld: 107 mg/dL — ABNORMAL HIGH (ref 70–99)
Potassium: 3.2 mmol/L — ABNORMAL LOW (ref 3.5–5.1)
Sodium: 131 mmol/L — ABNORMAL LOW (ref 135–145)

## 2020-05-10 LAB — GLUCOSE, CAPILLARY
Glucose-Capillary: 128 mg/dL — ABNORMAL HIGH (ref 70–99)
Glucose-Capillary: 251 mg/dL — ABNORMAL HIGH (ref 70–99)
Glucose-Capillary: 287 mg/dL — ABNORMAL HIGH (ref 70–99)
Glucose-Capillary: 377 mg/dL — ABNORMAL HIGH (ref 70–99)

## 2020-05-10 LAB — COOXEMETRY PANEL
Carboxyhemoglobin: 2.7 % — ABNORMAL HIGH (ref 0.5–1.5)
Methemoglobin: 1 % (ref 0.0–1.5)
O2 Saturation: 57.1 %
Total hemoglobin: 11 g/dL — ABNORMAL LOW (ref 12.0–16.0)

## 2020-05-10 LAB — MAGNESIUM: Magnesium: 2.3 mg/dL (ref 1.7–2.4)

## 2020-05-10 MED ORDER — ACETAZOLAMIDE 250 MG PO TABS: 250.0000 mg | ORAL_TABLET | Freq: Two times a day (BID) | ORAL | Status: DC

## 2020-05-10 MED ORDER — INSULIN ASPART 100 UNIT/ML ~~LOC~~ SOLN
4.0000 [IU] | Freq: Three times a day (TID) | SUBCUTANEOUS | Status: DC
  Administered 2020-05-10 – 2020-05-11 (×3): 4 [IU] via SUBCUTANEOUS

## 2020-05-10 MED ORDER — PREDNISONE 20 MG PO TABS
30.0000 mg | ORAL_TABLET | Freq: Every day | ORAL | Status: DC
  Administered 2020-05-10 – 2020-05-11 (×2): 30 mg via ORAL
  Filled 2020-05-10 (×2): qty 1

## 2020-05-10 MED ORDER — POTASSIUM CHLORIDE CRYS ER 20 MEQ PO TBCR: 20.0000 meq | EXTENDED_RELEASE_TABLET | Freq: Once | ORAL | Status: DC

## 2020-05-10 MED ORDER — POTASSIUM CHLORIDE CRYS ER 20 MEQ PO TBCR
20.0000 meq | EXTENDED_RELEASE_TABLET | Freq: Once | ORAL | Status: AC
  Administered 2020-05-10: 20 meq via ORAL
  Filled 2020-05-10: qty 1

## 2020-05-10 MED ORDER — INSULIN ASPART 100 UNIT/ML ~~LOC~~ SOLN
2.0000 [IU] | Freq: Once | SUBCUTANEOUS | Status: AC
  Administered 2020-05-10: 2 [IU] via SUBCUTANEOUS

## 2020-05-10 MED ORDER — POTASSIUM CHLORIDE CRYS ER 20 MEQ PO TBCR
40.0000 meq | EXTENDED_RELEASE_TABLET | ORAL | Status: AC
  Administered 2020-05-10 (×3): 40 meq via ORAL
  Filled 2020-05-10 (×2): qty 2

## 2020-05-10 NOTE — Progress Notes (Signed)
Physical Therapy Treatment Patient Details Name: John [name] MRN: [id number] DOB: 01-Dec-1945 Today's Date: 05/10/2020    History of Present Illness Pt admit with chronic heart failure and Afib. S/p TAVR 2/8    PT Comments    Pt received up in chair, spouse present, pt agreeable to therapy session and with good participation and tolerance for mobility. Pt reports continued L ankle pain however able to perform household distance gait task with RW and min guard at most for safety due to multiple lines/leads, deferred hallway ambulation due to urgency to use bathroom with lasix he has been taking. Pt performed bed mobility with up to min guard and reviewed pressure offloading strategies and importance of HEP compliance with pt/spouse, they were receptive to all education. Pt continues to benefit from PT services to progress toward functional mobility goals. Continue to recommend HHPT.   Follow Up Recommendations  Home health PT;Supervision/Assistance - 24 hour     Equipment Recommendations  None recommended by PT    Recommendations for Other Services       Precautions / Restrictions Precautions Precautions: Fall Restrictions Weight Bearing Restrictions: No    Mobility  Bed Mobility Overal bed mobility: Needs Assistance Bed Mobility: Rolling;Sit to Sidelying Rolling: Supervision       Sit to sidelying: Min guard General bed mobility comments: cues for log rolling for safety due to lines/leads    Transfers Overall transfer level: Needs assistance Equipment used: Rolling walker (2 wheeled) Transfers: Sit to/from Stand Sit to Stand: Min assist         General transfer comment: from chair and toilet heights to RW, pt with fair carryover of instruction from previous sessions  Ambulation/Gait Ambulation/Gait assistance: Min guard Gait Distance (Feet): 90 Feet (29f to bathroom, then 969fback/forth in room) Assistive device: Rolling walker (2 wheeled) Gait  Pattern/deviations: Step-through pattern;Decreased stride length (downward gaze) Gait velocity: decreased   General Gait Details: cues for posture/relaxed shoulders and intermittent cues for proximity to [employer]  Modified Rankin (Stroke Patients Only)       Balance Overall balance assessment: Needs assistance Sitting-balance support: No upper extremity supported;Feet supported Sitting balance-Leahy Scale: Fair     Standing balance support: No upper extremity supported Standing balance-Leahy Scale: Poor Standing balance comment: heavily reliant on BUE support of RW, external assist mostly for safety                            Cognition Arousal/Alertness: Awake/alert Behavior During Therapy: WFL for tasks assessed/performed Overall Cognitive Status: Within Functional Limits for tasks assessed                                 General Comments: pleasantly cooperative, some increased time/cues for safety with mobility      Exercises Other Exercises Other Exercises: cues for supine AP, GS, QS pt performed x5 reps ea encouraged him to perform 3x10 reps daily    General Comments        Pertinent Vitals/Pain Pain Assessment: Faces Faces Pain Scale: Hurts even more Pain Location: L ankle pain, likely gout related Pain Descriptors / Indicators: Grimacing;Discomfort;Tender Pain Intervention(s): Monitored during session;Repositioned (pt reports he will ask for pain meds when he is ready)    Home Living  Prior Function            PT Goals (current goals can now be found in the care plan section) Acute Rehab PT Goals Patient Stated Goal: home PT Goal Formulation: With patient/family Time For Goal Achievement: 05/12/20 Potential to Achieve Goals: Good Progress towards PT goals: Progressing toward goals    Frequency    Min 3X/week      PT Plan Current plan remains  appropriate    Co-evaluation              AM-PAC PT "6 Clicks" Mobility   Outcome Measure  Help needed turning from your back to your side while in a flat bed without using bedrails?: None Help needed moving from lying on your back to sitting on the side of a flat bed without using bedrails?: A Little Help needed moving to and from a bed to a chair (including a wheelchair)?: A Little Help needed standing up from a chair using your arms (e.g., wheelchair or bedside chair)?: A Little Help needed to walk in hospital room?: A Little Help needed climbing 3-5 steps with a railing? : A Little 6 Click Score: 19    End of Session Equipment Utilized During Treatment: Gait belt Activity Tolerance: Patient tolerated treatment well Patient left: with call bell/phone within reach;with family/visitor present;in bed;with bed alarm set;with nursing/sitter in room Nurse Communication: Mobility status PT Visit Diagnosis: Muscle weakness (generalized) (M62.81);Unsteadiness on feet (R26.81) Pain - Right/Left: Left Pain - part of body: Ankle and joints of foot     Time: 5974-7185 PT Time Calculation (min) (ACUTE ONLY): 36 min  Charges:  $Gait Training: 8-22 mins $Therapeutic Activity: 8-22 mins                     Katrinia Straker P., PTA Acute Rehabilitation Services Pager: [phone] Office: [place] 05/10/2020, 5:23 PM

## 2020-05-10 NOTE — Progress Notes (Signed)
PT Cancellation Note  Patient Details Name: John [name] MRN: [id number] DOB: 1945/05/31   Cancelled Treatment:    Reason Eval/Treat Not Completed: (P) Patient declined, no reason specified;Other (comment) (pt c/o feeling nausea/sleeping poorly overnight, wants to rest a few more hours prior to OOB.) Will continue efforts after lunchtime as schedule permits per PT POC.   Brittnay Pigman M Juliann Olesky 05/10/2020, 9:59 AM

## 2020-05-10 NOTE — Progress Notes (Signed)
Mobility Specialist - Progress Note   05/10/20 1817  Mobility  Activity Ambulated in hall  Level of Assistance Minimal assist, patient does 75% or more  Assistive Device Front wheel walker  Distance Ambulated (ft) 200 ft  Mobility Response Tolerated well  Mobility performed by Mobility specialist  $Mobility charge 1 Mobility   Pre-mobility: 68 HR During mobility: 92 HR Post-mobility: 72 HR  Pt min assist to stand from bed. States pain was bearable during ambulation. Pt back in bed after walk.   Pricilla Handler Mobility Specialist Mobility Specialist Phone: [phone]

## 2020-05-10 NOTE — Progress Notes (Signed)
Nutrition Follow Up  DOCUMENTATION CODES:   Obesity unspecified  INTERVENTION:    Ensure Enlive po TID, each supplement provides 350 kcal and 20 grams of protein  MVI daily  NUTRITION DIAGNOSIS:   Moderate Malnutrition related to chronic illness (CHF, NASH cirrhosis) as evidenced by mild fat depletion,moderate fat depletion,mild muscle depletion,moderate muscle depletion.  Ongoing  GOAL:   Patient will meet greater than or equal to 90% of their needs   Progressing   MONITOR:   PO intake,Supplement acceptance,Labs,Weight trends,I & O's  REASON FOR ASSESSMENT:   Consult Assessment of nutrition requirement/status  ASSESSMENT:   75 year old male who presented on 1/31 with worsening SOB and weight gain. PMH of CHF, T2DM, atrial fibrillation, CHB s/p PPM, NASH cirrhosis, CKD stage III, HTN, HLD. Admitted with acute on chronic combined systolic and diastolic heart failure.   2/03 - [place] 2/08 - TAVR  Patient reports appetite is progressing. Is consuming 25% of two meals and 50-100% of one meal each day. Last two meals documented as 60% and 100%. Is drinking one Ensure daily and states he only receives one vs the ordered three. Patient willing to take three daily until appetite increased. Nursing made aware.   Admission weight: 106.7 kg  Current weight: 103 kg   I/O: 1950 ml x 24 hrs  Drips: 200 mg lasix in D5 @ 11 ml/hr  Medications: SS novolog, lantus, prednisone  Labs: Na 131 (L) K 3.2 (L) CBG 128-251  Diet Order:   Diet Order            Diet heart healthy/carb modified Room service appropriate? Yes with Assist; Fluid consistency: Thin  Diet effective now                 EDUCATION NEEDS:   Education needs have been addressed  Skin:  Skin Assessment: Skin Integrity Issues: Skin Integrity Issues:: Incisions Incisions: chest, bilateral groin  Last BM:  2/14  Height:   Ht Readings from Last 1 Encounters:  05/07/20 6' (1.829 m)    Weight:   Wt  Readings from Last 1 Encounters:  05/10/20 103 kg    BMI:  Body mass index is 30.8 kg/m.  Estimated Nutritional Needs:   Kcal:  2000-2200  Protein:  100-120 grams  Fluid:  1.8 L/day  Mariana Single RD, LDN Clinical Nutrition Pager listed in [place]

## 2020-05-10 NOTE — Progress Notes (Addendum)
Patient ID: John [name], male   DOB: 1945-09-05, 75 y.o.   MRN: [id number]    Advanced Heart Failure Rounding Note   Subjective:     S/p TAVR (via left subclavian approach) on 05/03/20    CVP remains elevated at 15 personally performed.  No longer having knee,ankle or elbow pain but does have some left index finger pain.  He denies dyspnea or chest pain.  Better diuresis with lasix drip in the afternoon.  Objective:   Weight Range:  Vital Signs:   Temp:  [98.6 F (37 C)-98.8 F (37.1 C)] 98.8 F (37.1 C) (02/15 0318) Pulse Rate:  [60-65] 61 (02/15 0318) Resp:  [14-19] 14 (02/15 0318) BP: (111-115)/(49-62) 115/62 (02/15 0318) SpO2:  [100 %] 100 % (02/15 0318) Weight:  [103 kg] 103 kg (02/15 0500) Last BM Date: 05/09/20  Weight change: Filed Weights   05/08/20 0500 05/09/20 0700 05/10/20 0500  Weight: 104 kg 103.4 kg 103 kg    Intake/Output:   Intake/Output Summary (Last 24 hours) at 05/10/2020 0349 Last data filed at 05/10/2020 0700 Gross per 24 hour  Intake 250 ml  Output 1950 ml  Net -1700 ml     Physical Exam: Cardiac: JVD to mandible, normal rate and rhythm, no murmurs, rubs or gallops, Trace LE edema Pulmonary: CTAB, not in distress Abdominal: non distended abdomen, soft and nontender Psych: Alert, conversant, in good spirits    Telemetry: AF with RV pacing 60 + PVCs. 2x episodes of NSVT longest 12 beats in last 24hrs, Personally reviewed   RHC:  RA = 12 RV = 51/11 PA = 52/15 (29) PCW = 29 ( v = 44) Fick cardiac output/index = 4.5/2.0 Thermo CO/CI = 5.2/2.3 PVR = < 1.0 WU Ao sat = 110% PA sat = 64%, 66%  Assessment: 1. Markedly elevated left sided filling pressures with prominent v-waves in PCWP tracing 2. Moderate reduced cardiac index 3. SBPs in 90s with severe AS  Intraop TEE:  Pre TAVR severe AS with tri leaflet AV no AR men gradient 28 mmHg peak 49 mmHg  in  setting of moderately reduced EF AVA 0.9 cm2.   Post TAVR: well  positioned 29 mm Sapien 3 valve Trivial PVL seen around  1:00 on  SA images mean gradient 5 peak 10 mmHg AVA 2.3 cm2.  EF improved 35-40% post procedure  Labs: Basic Metabolic Panel: Recent Labs  Lab 05/04/20 0210 05/05/20 0126 05/06/20 0300 05/07/20 0259 05/08/20 0518 05/09/20 0815 05/10/20 0350  NA 134*   < > 131* 131* 131* 131* 131*  K 3.3*   < > 3.7 4.4 4.5 3.4* 3.2*  CL 98   < > 96* 98 96* 93* 92*  CO2 26   < > 25 26 26 28 29   GLUCOSE 101*   < > 191* 158* 165* 154* 107*  BUN 20   < > 25* 25* 25* 26* 24*  CREATININE 1.44*   < > 1.47* 1.42* 1.42* 1.29* 1.26*  CALCIUM 8.1*   < > 8.2* 8.3* 8.5* 8.6* 8.7*  MG 2.5*  --   --   --   --   --   --    < > = values in this interval not displayed.    Liver Function Tests: No results for input(s): AST, ALT, ALKPHOS, BILITOT, PROT, ALBUMIN in the last 168 hours. No results for input(s): LIPASE, AMYLASE in the last 168 hours. No results for input(s): AMMONIA in the last 168 hours.  CBC:  Recent Labs  Lab 05/04/20 0210 05/05/20 0126 05/06/20 0300 05/09/20 0815 05/10/20 0350  WBC 8.0 8.7 8.0 8.9 9.3  HGB 9.7* 9.6* 9.4* 9.8* 10.3*  HCT 30.3* 29.8* 29.4* 30.5* 30.4*  MCV 94.7 93.4 93.6 93.6 91.3  PLT 122* 133* 122* 156 172    Cardiac Enzymes: No results for input(s): CKTOTAL, CKMB, CKMBINDEX, TROPONINI in the last 168 hours.  BNP: BNP (last 3 results) Recent Labs    03/09/20 1544 03/20/20 1253 04/25/20 1157  BNP 1,540.0* 2,072.0* 2,410.2*    ProBNP (last 3 results) No results for input(s): PROBNP in the last 8760 hours.    Other results:  Imaging: No results found.   Medications:     Scheduled Medications: . apixaban  5 mg Oral BID  . aspirin  81 mg Oral Daily  . Chlorhexidine Gluconate Cloth  6 each Topical Daily  . dapagliflozin propanediol  10 mg Oral QAC breakfast  . feeding supplement  237 mL Oral TID BM  . insulin aspart  0-9 Units Subcutaneous TID WC  . insulin aspart  2 Units Subcutaneous TID WC   . insulin aspart  2 Units Subcutaneous Once  . insulin glargine  5 Units Subcutaneous QHS  . potassium chloride  40 mEq Oral Q4H  . predniSONE  30 mg Oral Q breakfast  . sodium chloride flush  10-40 mL Intracatheter Q12H    Infusions: . sodium chloride Stopped (05/07/20 1105)  . furosemide (LASIX) 200 mg in dextrose 5% 100 mL (60m/mL) infusion 20 mg/hr (05/09/20 1816)    PRN Medications: sodium chloride, acetaminophen, diclofenac Sodium, morphine injection, ondansetron (ZOFRAN) IV, sodium chloride flush, sodium chloride flush, traMADol   Assessment/Plan:   1. 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 Aortic valve also appears severely calcified and moderate-severely stenosed, suspect AS also contributing to CM. [facility].  -TAVR 2/8 went well, resolution of AS and improvement in EF by 10%, TTE with similar findings also while he was on dobutamine - Off DBA. Co-ox at 57%. Follow closely  - CVP remains elevated. Diuresis improved with lasix gtt in the afternoon will increase slightly and continue -aggressively replace K  2. Mitral Regurgitation  - Moderate by TEE 8/21. - Severe by TTE 04/26/20 and consistent with severe functional MR - Improved significantly after TAVR now mild-moderate  3. Aortic Stenosis  - severe low flow/low gradient aortic stenosis with mean gradient 15.5 mmHg, dimensionless index 0.23, AVA 0.86 cm2  -ECHO 2/1 with similar findings mean gradient 21, AVA 0.87 cm2 - s/p successful TAVR procedure 2/8 with resolution of AS  4. Atrial Flutter/ Chronic Atrial Fibrillation - s/p AFL ablation. -In chronic Afib, rate controlled. -Switched to heparin on  2/3 for TAVR procedure, holding eliquis - continue holding bb - Back on eliquis 2/9. No bleeding   5. H/o CHB  - s/p PPM followed by Dr. TLovena Le - ? RV paced CM. He is pacing ~40%. - Will need eventual BIV upgrade   6. NASH cirrhosis -followed by Dr. CJenetta Downer NAFLD score 2.90 consistent with advanced fibrosis -Elastography ordered but not yet complete -no prior history of bleeding or known varices  7. Stage IIIb  CKD -Followed by nephrology. -Baseline SCr ~1.5-1.8 - Cr improved to 1.26 today - Follow with diuresis  8. T2DM -On insulin.Hgba1C 6.8 (12/21) -On farxiga - continue basal bolus and SSI insulin.  We will increase mealtime insulin starting at lunch since we started steroid therapy and monitor closely make adjustments as needed  9. Chest wall hematoma - Uncertain etiology. No underlying rib fracture, no RP hematoma.  - Hgb stable at 9.8  10.Swelling around left subclavian access site: - LUE u/s 2/11 No DVT. Good pulses. No evidence of compartment syndrome  - Improving. Continue elevation and wraps.   11. Polyarticular gout -resulting from diuresis -current symptoms in left 2nd MIP joint -start prednisone  12. NSVT -2 episodes longest around 12 beats -aggressively replace K, check magnesium -when more optimized from a volume stand point may be able to add beta blocker if bp tolerates, will monitor closely for now  Length of Stay: 15   Katherine Roan MD 05/10/2020, 8:28 AM  Advanced Heart Failure Team Pager [phone] (M-F; [place])  Please contact [facility] for night-coverage after hours (4p -7a ) and weekends on [email or web address]    Katherine Roan, MD  8:28 AM  Patient seen and examined with the above-signed Advanced Practice Provider and/or Housestaff. I personally reviewed laboratory data, imaging studies and relevant notes. I independently examined the patient and formulated the important aspects of the plan. I have edited the note to  reflect any of my changes or salient points. I have personally discussed the plan with the patient and/or family.   C/o severe pain in multiple joints. Uric acid up.  Now on lasix gtt. WEight down 1 pound. CVP 10-11   General:  Lying in bed. Weak  appearing. No resp difficulty HEENT: normal Neck: supple.CVP 10-11. Carotids 2+ bilat; no bruits. No lymphadenopathy or thryomegaly appreciated. Cor: PMI nondisplaced. Regular rate & rhythm. No rubs, gallops or murmurs. Lungs: clear Abdomen: soft, nontender, nondistended. No hepatosplenomegaly. No bruits or masses. Good bowel sounds. Extremities: no cyanosis, clubbing, rash, edema Multiple joints tender to palpitation. (L 1st finger, L knee, L ankle)  Neuro: alert & orientedx3, cranial nerves grossly intact. moves all 4 extremities w/o difficulty. Affect pleasant  Continue lasix gtt. Will add prednisone x 3 days for gout flare. Supp K.   Glori Bickers, MD  10:27 AM

## 2020-05-11 DIAGNOSIS — N1832 Chronic kidney disease, stage 3b: Secondary | ICD-10-CM | POA: Diagnosis not present

## 2020-05-11 DIAGNOSIS — I5023 Acute on chronic systolic (congestive) heart failure: Secondary | ICD-10-CM | POA: Diagnosis not present

## 2020-05-11 DIAGNOSIS — I350 Nonrheumatic aortic (valve) stenosis: Secondary | ICD-10-CM | POA: Diagnosis not present

## 2020-05-11 DIAGNOSIS — I482 Chronic atrial fibrillation, unspecified: Secondary | ICD-10-CM | POA: Diagnosis not present

## 2020-05-11 LAB — BASIC METABOLIC PANEL
Anion gap: 9 (ref 5–15)
BUN: 29 mg/dL — ABNORMAL HIGH (ref 8–23)
CO2: 28 mmol/L (ref 22–32)
Calcium: 8.5 mg/dL — ABNORMAL LOW (ref 8.9–10.3)
Chloride: 94 mmol/L — ABNORMAL LOW (ref 98–111)
Creatinine, Ser: 1.36 mg/dL — ABNORMAL HIGH (ref 0.61–1.24)
GFR, Estimated: 55 mL/min — ABNORMAL LOW (ref >60)
Glucose, Bld: 300 mg/dL — ABNORMAL HIGH (ref 70–99)
Potassium: 3.9 mmol/L (ref 3.5–5.1)
Sodium: 131 mmol/L — ABNORMAL LOW (ref 135–145)

## 2020-05-11 LAB — COOXEMETRY PANEL
Carboxyhemoglobin: 3 % — ABNORMAL HIGH (ref 0.5–1.5)
Methemoglobin: 1 % (ref 0.0–1.5)
O2 Saturation: 69.2 %
Total hemoglobin: 10.3 g/dL — ABNORMAL LOW (ref 12.0–16.0)

## 2020-05-11 LAB — CBC
HCT: 28.8 % — ABNORMAL LOW (ref 39.0–52.0)
Hemoglobin: 9.6 g/dL — ABNORMAL LOW (ref 13.0–17.0)
MCH: 30.6 pg (ref 26.0–34.0)
MCHC: 33.3 g/dL (ref 30.0–36.0)
MCV: 91.7 fL (ref 80.0–100.0)
Platelets: 167 10*3/uL (ref 150–400)
RBC: 3.14 MIL/uL — ABNORMAL LOW (ref 4.22–5.81)
RDW: 18.1 % — ABNORMAL HIGH (ref 11.5–15.5)
WBC: 10.2 10*3/uL (ref 4.0–10.5)
nRBC: 0 % (ref 0.0–0.2)

## 2020-05-11 LAB — GLUCOSE, CAPILLARY
Glucose-Capillary: 305 mg/dL — ABNORMAL HIGH (ref 70–99)
Glucose-Capillary: 293 mg/dL — ABNORMAL HIGH (ref 70–99)
Glucose-Capillary: 289 mg/dL — ABNORMAL HIGH (ref 70–99)
Glucose-Capillary: 291 mg/dL — ABNORMAL HIGH (ref 70–99)
Glucose-Capillary: 247 mg/dL — ABNORMAL HIGH (ref 70–99)

## 2020-05-11 MED ORDER — INSULIN ASPART 100 UNIT/ML ~~LOC~~ SOLN
0.0000 [IU] | Freq: Three times a day (TID) | SUBCUTANEOUS | Status: DC
  Administered 2020-05-11 (×2): 11 [IU] via SUBCUTANEOUS
  Administered 2020-05-12: 4 [IU] via SUBCUTANEOUS
  Administered 2020-05-12: 15 [IU] via SUBCUTANEOUS
  Administered 2020-05-12: 11 [IU] via SUBCUTANEOUS
  Administered 2020-05-13: 7 [IU] via SUBCUTANEOUS

## 2020-05-11 MED ORDER — TORSEMIDE 20 MG PO TABS: 60.0000 mg | ORAL_TABLET | Freq: Every day | ORAL | Status: DC

## 2020-05-11 MED ORDER — INSULIN ASPART 100 UNIT/ML ~~LOC~~ SOLN
0.0000 [IU] | Freq: Every day | SUBCUTANEOUS | Status: DC
  Administered 2020-05-11 – 2020-05-12 (×2): 2 [IU] via SUBCUTANEOUS
  Administered 2020-05-13: 3 [IU] via SUBCUTANEOUS

## 2020-05-11 MED ORDER — FUROSEMIDE 10 MG/ML IJ SOLN
20.0000 mg/h | INTRAVENOUS | Status: DC
  Administered 2020-05-11 – 2020-05-14 (×7): 20 mg/h via INTRAVENOUS
  Filled 2020-05-11 (×10): qty 20

## 2020-05-11 MED ORDER — INSULIN GLARGINE 100 UNIT/ML ~~LOC~~ SOLN
10.0000 [IU] | Freq: Every day | SUBCUTANEOUS | Status: DC
  Administered 2020-05-11 – 2020-05-14 (×4): 10 [IU] via SUBCUTANEOUS
  Filled 2020-05-11 (×5): qty 0.1

## 2020-05-11 MED ORDER — POTASSIUM CHLORIDE CRYS ER 20 MEQ PO TBCR
40.0000 meq | EXTENDED_RELEASE_TABLET | Freq: Once | ORAL | Status: AC
  Administered 2020-05-11: 40 meq via ORAL
  Filled 2020-05-11: qty 2

## 2020-05-11 MED ORDER — TORSEMIDE 20 MG PO TABS: 80.0000 mg | ORAL_TABLET | Freq: Every day | ORAL | Status: DC

## 2020-05-11 MED ORDER — PREDNISONE 20 MG PO TABS
40.0000 mg | ORAL_TABLET | Freq: Every day | ORAL | Status: AC
  Administered 2020-05-12: 40 mg via ORAL
  Filled 2020-05-11: qty 2

## 2020-05-11 MED ORDER — INSULIN ASPART 100 UNIT/ML ~~LOC~~ SOLN
8.0000 [IU] | Freq: Three times a day (TID) | SUBCUTANEOUS | Status: DC
  Administered 2020-05-11 – 2020-05-13 (×6): 8 [IU] via SUBCUTANEOUS

## 2020-05-11 NOTE — Progress Notes (Addendum)
Patient ID: John [name], male   DOB: January 03, 1946, 75 y.o.   MRN: [id number]    Advanced Heart Failure Rounding Note   Subjective:    S/p TAVR (via left subclavian approach) on 05/03/20    Output not recorded but weight down 3kg, less edematous JVP improved.  Having multijoint pain.    Objective:   Weight Range:  Vital Signs:   Temp:  [97.3 F (36.3 C)-99.1 F (37.3 C)] 98.3 F (36.8 C) (02/16 0349) Pulse Rate:  [60-71] 65 (02/16 0349) Resp:  [11-20] 12 (02/16 0349) BP: (102-123)/(50-61) 111/55 (02/16 0349) SpO2:  [96 %-99 %] 97 % (02/16 0349) Weight:  [99.8 kg] 99.8 kg (02/16 0348) Last BM Date: 05/10/20  Weight change: Filed Weights   05/09/20 0700 05/10/20 0500 05/11/20 0348  Weight: 103.4 kg 103 kg 99.8 kg    Intake/Output:   Intake/Output Summary (Last 24 hours) at 05/11/2020 0735 Last data filed at 05/10/2020 1800 Gross per 24 hour  Intake --  Output 125 ml  Net -125 ml     Physical Exam: Cardiac: JVD 11-12, normal rate and rhythm, no murmurs, rubs or gallops, No LE edema Pulmonary: CTAB, not in distress Abdominal: non distended abdomen, soft and nontender Psych: Alert, conversant, in good spirits    Telemetry: AF with RV pacing 60 + PVCs. No NSVT  RHC:  RA = 12 RV = 51/11 PA = 52/15 (29) PCW = 29 ( v = 44) Fick cardiac output/index = 4.5/2.0 Thermo CO/CI = 5.2/2.3 PVR = < 1.0 WU Ao sat = 110% PA sat = 64%, 66%  Assessment: 1. Markedly elevated left sided filling pressures with prominent v-waves in PCWP tracing 2. Moderate reduced cardiac index 3. SBPs in 90s with severe AS  Intraop TEE:  Pre TAVR severe AS with tri leaflet AV no AR men gradient 28 mmHg peak 49 mmHg  in  setting of moderately reduced EF AVA 0.9 cm2.   Post TAVR: well positioned 29 mm Sapien 3 valve Trivial PVL seen around  1:00 on  SA images mean gradient 5 peak 10 mmHg AVA 2.3 cm2.  EF improved 35-40% post procedure  Labs: Basic Metabolic Panel: Recent Labs  Lab  05/07/20 0259 05/08/20 0518 05/09/20 0815 05/10/20 0350 05/10/20 0839 05/11/20 0505  NA 131* 131* 131* 131*  --  131*  K 4.4 4.5 3.4* 3.2*  --  3.9  CL 98 96* 93* 92*  --  94*  CO2 26 26 28 29   --  28  GLUCOSE 158* 165* 154* 107*  --  300*  BUN 25* 25* 26* 24*  --  29*  CREATININE 1.42* 1.42* 1.29* 1.26*  --  1.36*  CALCIUM 8.3* 8.5* 8.6* 8.7*  --  8.5*  MG  --   --   --   --  2.3  --     Liver Function Tests: No results for input(s): AST, ALT, ALKPHOS, BILITOT, PROT, ALBUMIN in the last 168 hours. No results for input(s): LIPASE, AMYLASE in the last 168 hours. No results for input(s): AMMONIA in the last 168 hours.  CBC: Recent Labs  Lab 05/05/20 0126 05/06/20 0300 05/09/20 0815 05/10/20 0350 05/11/20 0505  WBC 8.7 8.0 8.9 9.3 10.2  HGB 9.6* 9.4* 9.8* 10.3* 9.6*  HCT 29.8* 29.4* 30.5* 30.4* 28.8*  MCV 93.4 93.6 93.6 91.3 91.7  PLT 133* 122* 156 172 167    Cardiac Enzymes: No results for input(s): CKTOTAL, CKMB, CKMBINDEX, TROPONINI in the last 168 hours.  BNP: BNP (last 3 results) Recent Labs    03/09/20 1544 03/20/20 1253 04/25/20 1157  BNP 1,540.0* 2,072.0* 2,410.2*    ProBNP (last 3 results) No results for input(s): PROBNP in the last 8760 hours.    Other results:  Imaging: No results found.   Medications:     Scheduled Medications: . apixaban  5 mg Oral BID  . aspirin  81 mg Oral Daily  . Chlorhexidine Gluconate Cloth  6 each Topical Daily  . dapagliflozin propanediol  10 mg Oral QAC breakfast  . feeding supplement  237 mL Oral TID BM  . insulin aspart  0-9 Units Subcutaneous TID WC  . insulin aspart  4 Units Subcutaneous TID WC  . insulin glargine  5 Units Subcutaneous QHS  . predniSONE  30 mg Oral Q breakfast  . sodium chloride flush  10-40 mL Intracatheter Q12H    Infusions: . sodium chloride Stopped (05/07/20 1105)  . furosemide (LASIX) 200 mg in dextrose 5% 100 mL (67m/mL) infusion 22 mg/hr (05/10/20 1213)    PRN  Medications: sodium chloride, acetaminophen, diclofenac Sodium, morphine injection, ondansetron (ZOFRAN) IV, sodium chloride flush, sodium chloride flush, traMADol   Assessment/Plan:   1. 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 Aortic valve also appears severely calcified and moderate-severely stenosed, suspect AS also contributing to CM. [facility].  -TAVR 2/8 went well, resolution of AS and improvement in EF by 10%, TTE with similar findings also while he was on dobutamine - Off DBA. Co-ox at 69%. - CVP better, weight down 3kg try him on oral torsemide today  2. Mitral Regurgitation  - Moderate by TEE 8/21. - Severe by TTE 04/26/20 and consistent with severe functional MR - Improved significantly after TAVR now mild-moderate  3. Aortic Stenosis  - severe low flow/low gradient aortic stenosis with mean gradient 15.5 mmHg, dimensionless index 0.23, AVA 0.86 cm2  -ECHO 2/1 with similar findings mean gradient 21, AVA 0.87 cm2 - s/p successful TAVR procedure 2/8 with resolution of AS  4. Atrial Flutter/ Chronic Atrial Fibrillation - s/p AFL ablation. -In chronic Afib, rate controlled. -Switched to heparin on 2/3 for TAVR procedure, holding eliquis - continue holding bb - Back on eliquis 2/9. No bleeding   5. H/o CHB  - s/p PPM followed by Dr. TLovena Le - ? RV paced CM. He is pacing ~40%. - Will need eventual BIV upgrade   6. NASH cirrhosis -followed by Dr. CJenetta Downer NAFLD score 2.90 consistent with advanced fibrosis -Elastography ordered but not yet complete -no prior history of bleeding or known varices  7. Stage IIIb CKD -Followed by nephrology. -Baseline SCr  ~1.5-1.8 - Cr improved slightly worse transitioning to oral torsemide - Follow with diuresis  8. T2DM -On insulin.Hgba1C 6.8 (12/21) -On farxiga - continue basal bolus and SSI insulin.  Continue to adjust due to steroids  9. Chest wall hematoma - Uncertain etiology. No underlying rib fracture, no RP hematoma.  - Hgb stable at 9.6  10.Swelling around left subclavian access site: - LUE u/s 2/11 No DVT. Good pulses. No evidence of compartment syndrome  - Improving. Continue elevation and wraps.   11. Polyarticular gout -resulting  from diuresis -today complaining of symptoms in multiple joints -continue pred 42m x3   12. NSVT -No further episodes -aggressively replace K, check magnesium -when more optimized from a volume stand point may be able to add beta blocker if bp tolerates, will monitor closely for now  Length of Stay: 16   WKatherine RoanMD 05/11/2020, 7:35 AM  Advanced Heart Failure Team Pager [phone](M-F; [place]  [facility] for night-coverage after hours (4p -7a ) and weekends on [email or web address]    WKatherine Roan MD  7:35 AM  Patient seen and examined with the above-signed Advanced Practice Provider and/or Housestaff. I personally reviewed laboratory data, imaging studies and relevant notes. I independently examined the patient and formulated the important aspects of the plan. I have edited the note to reflect any of my changes or salient points. I have personally discussed the plan with the patient and/or family.  Continues to diurese well but CVP actually back up to 17-18 today. Co-ox 69%  Gout pain improved but not resolved. Very weak. Denies CP, sob, orthopnea or PND. Creatinine stable.   General:  Lying in bed  No resp difficulty HEENT: normal Neck: supple. JVP to ear  Carotids 2+ bilat; no bruits. No lymphadenopathy or thryomegaly appreciated. Cor: PMI nondisplaced. Regular rate & rhythm. No rubs, gallops or murmurs. Lungs:  clear Abdomen: soft, nontender, nondistended. No hepatosplenomegaly. No bruits or masses. Good bowel sounds. Extremities: no cyanosis, clubbing, rash, edema Neuro: alert & orientedx3, cranial nerves grossly intact. moves all 4 extremities w/o difficulty. Affect pleasant  CVP continue to climb despite diuresis. Will continue lasix gtt. Continue prednisone for polyarticular gout - will increase dose from 35mdaily to 40. Continue PT/OT.   DaGlori BickersMD  9:33 AM

## 2020-05-11 NOTE — Progress Notes (Signed)
Mobility Specialist - Progress Note   05/11/20 1140  Mobility  Activity Ambulated in hall  Level of Assistance Minimal assist, patient does 75% or more  Assistive Device Front wheel walker  Distance Ambulated (ft) 180 ft  Mobility Response Tolerated well  Mobility performed by Mobility specialist  $Mobility charge 1 Mobility   Pre-mobility: 72 HR During mobility: 91 HR Post-mobility: 80 HR  Pt min assist to elevate trunk when sitting up and to stand from bed w/ bed height elevated. Distance limited by urinary urgency. Pt to recliner after walk, wife in room.   Pricilla Handler Mobility Specialist Mobility Specialist Phone: [phone]

## 2020-05-12 DIAGNOSIS — I350 Nonrheumatic aortic (valve) stenosis: Secondary | ICD-10-CM | POA: Diagnosis not present

## 2020-05-12 DIAGNOSIS — I5023 Acute on chronic systolic (congestive) heart failure: Secondary | ICD-10-CM | POA: Diagnosis not present

## 2020-05-12 DIAGNOSIS — I482 Chronic atrial fibrillation, unspecified: Secondary | ICD-10-CM | POA: Diagnosis not present

## 2020-05-12 DIAGNOSIS — N1832 Chronic kidney disease, stage 3b: Secondary | ICD-10-CM | POA: Diagnosis not present

## 2020-05-12 LAB — GLUCOSE, CAPILLARY
Glucose-Capillary: 167 mg/dL — ABNORMAL HIGH (ref 70–99)
Glucose-Capillary: 251 mg/dL — ABNORMAL HIGH (ref 70–99)
Glucose-Capillary: 310 mg/dL — ABNORMAL HIGH (ref 70–99)
Glucose-Capillary: 277 mg/dL — ABNORMAL HIGH (ref 70–99)

## 2020-05-12 LAB — CBC
HCT: 31.3 % — ABNORMAL LOW (ref 39.0–52.0)
Hemoglobin: 10.1 g/dL — ABNORMAL LOW (ref 13.0–17.0)
MCH: 30.1 pg (ref 26.0–34.0)
MCHC: 32.3 g/dL (ref 30.0–36.0)
MCV: 93.2 fL (ref 80.0–100.0)
Platelets: 175 10*3/uL (ref 150–400)
RBC: 3.36 MIL/uL — ABNORMAL LOW (ref 4.22–5.81)
RDW: 18 % — ABNORMAL HIGH (ref 11.5–15.5)
WBC: 11.5 10*3/uL — ABNORMAL HIGH (ref 4.0–10.5)
nRBC: 0 % (ref 0.0–0.2)

## 2020-05-12 LAB — COOXEMETRY PANEL
Carboxyhemoglobin: 2.9 % — ABNORMAL HIGH (ref 0.5–1.5)
Methemoglobin: 1.1 % (ref 0.0–1.5)
O2 Saturation: 67.9 %
Total hemoglobin: 10.5 g/dL — ABNORMAL LOW (ref 12.0–16.0)

## 2020-05-12 LAB — BASIC METABOLIC PANEL
Anion gap: 10 (ref 5–15)
BUN: 36 mg/dL — ABNORMAL HIGH (ref 8–23)
CO2: 29 mmol/L (ref 22–32)
Calcium: 8.7 mg/dL — ABNORMAL LOW (ref 8.9–10.3)
Chloride: 93 mmol/L — ABNORMAL LOW (ref 98–111)
Creatinine, Ser: 1.4 mg/dL — ABNORMAL HIGH (ref 0.61–1.24)
GFR, Estimated: 53 mL/min — ABNORMAL LOW (ref >60)
Glucose, Bld: 165 mg/dL — ABNORMAL HIGH (ref 70–99)
Potassium: 3.6 mmol/L (ref 3.5–5.1)
Sodium: 132 mmol/L — ABNORMAL LOW (ref 135–145)

## 2020-05-12 MED ORDER — POTASSIUM CHLORIDE CRYS ER 20 MEQ PO TBCR
40.0000 meq | EXTENDED_RELEASE_TABLET | ORAL | Status: AC
Start: 2020-05-12 — End: 2020-05-12
  Administered 2020-05-12 (×2): 40 meq via ORAL
  Filled 2020-05-12 (×3): qty 2

## 2020-05-12 MED ORDER — SORBITOL 70 % SOLN
30.0000 mL | Freq: Once | Status: DC
  Filled 2020-05-12: qty 30

## 2020-05-12 MED ORDER — ACETAZOLAMIDE 250 MG PO TABS
250.0000 mg | ORAL_TABLET | Freq: Two times a day (BID) | ORAL | Status: AC
  Administered 2020-05-12 – 2020-05-13 (×4): 250 mg via ORAL
  Filled 2020-05-12 (×4): qty 1

## 2020-05-12 NOTE — Care Management Important Message (Signed)
Important Message  Patient Details  Name: John [name] MRN: [id number] Date of Birth: [birth year]   Medicare Important Message Given:  Yes     Shelda Altes 05/12/2020, 10:41 AM

## 2020-05-12 NOTE — Progress Notes (Signed)
Mobility Specialist: Progress Note   05/12/20 1756  Mobility  Activity Ambulated in hall  Level of Assistance Contact guard assist, steadying assist  Assistive Device Front wheel walker  Distance Ambulated (ft) 470 ft  Mobility Response Tolerated well  Mobility performed by Mobility specialist  $Mobility charge 1 Mobility   Post-Mobility: 81 HR  Pt asx during ambulation. Pt back to bed per request.   Harrell Gave Jemarion Roycroft Mobility Specialist Mobility Specialist Phone: [phone]

## 2020-05-12 NOTE — Progress Notes (Signed)
Physical Therapy Treatment Patient Details Name: John [name] MRN: [id number] DOB: 07/16/1945 Today's Date: 05/12/2020    History of Present Illness Pt admit with chronic heart failure and Afib. S/p TAVR 2/8    PT Comments    Pt received up in chair, agreeable to therapy session and with improved tolerance for mobility this date and good progress toward goals. Pt requiring Supervision to Min guard for transfers and able to perform stair training and progress gait distance to greater than household distances with Supervision to min assist. Pt continues to benefit from PT services to progress toward functional mobility goals. Continue to recommend HHPT once medically cleared.   Follow Up Recommendations  Home health PT;Supervision/Assistance - 24 hour     Equipment Recommendations  None recommended by PT    Recommendations for Other Services       Precautions / Restrictions Precautions Precautions: Fall Restrictions Weight Bearing Restrictions: No    Mobility  Bed Mobility               General bed mobility comments: pt up in chair pre/post session    Transfers Overall transfer level: Needs assistance Equipment used: Rolling walker (2 wheeled) Transfers: Sit to/from Stand Sit to Stand: Min guard         General transfer comment: from chair and toilet heights to RW, cues for safe hand placement needed initially  Ambulation/Gait Ambulation/Gait assistance: Min guard;Supervision Gait Distance (Feet): 300 Feet (36f to toilet, then 3070f Assistive device: Rolling walker (2 wheeled) Gait Pattern/deviations: Step-through pattern;Decreased stride length (downward gaze, hunched shoulders) Gait velocity: decreased; improved from previous session, grossly ~0.4 m/s Gait velocity interpretation: <1.8 ft/sec, indicate of risk for recurrent falls General Gait Details: cues for posture/relaxed shoulders and intermittent cues for proximity to RW   Stairs Stairs:  Yes Stairs assistance: Min assist Stair Management: One rail Left;Step to pattern Number of Stairs: 1 General stair comments: HHA on RUE for steadying and L rail, cues for step-to pattern with strong leg, no LOB, mostly min guard to step up   Wheelchair Mobility    Modified Rankin (Stroke Patients Only)       Balance Overall balance assessment: Needs assistance Sitting-balance support: No upper extremity supported;Feet supported Sitting balance-Leahy Scale: Fair     Standing balance support: No upper extremity supported Standing balance-Leahy Scale: Poor Standing balance comment: heavily reliant on BUE support of RW, external assist mostly for safety with U UE support                            Cognition Arousal/Alertness: Awake/alert Behavior During Therapy: WFL for tasks assessed/performed Overall Cognitive Status: Within Functional Limits for tasks assessed                                 General Comments: good carryover of cues from previous session, still needs some cues for safe hand placement w standing; improved alertness/processing this date      Exercises      General Comments General comments (skin integrity, edema, etc.): encouraged BLE elevated and ankle pumps throughout day as well as IS use. RN notified pt reporting difficulty having BM and pt wanting something to help bowels move      Pertinent Vitals/Pain Pain Assessment: Faces Faces Pain Scale: Hurts a little bit Pain Location: LLE generalized "mild" pain Pain Descriptors / Indicators: Discomfort Pain Intervention(s):  Monitored during session;Repositioned    Home Living                      Prior Function            PT Goals (current goals can now be found in the care plan section) Acute Rehab PT Goals Patient Stated Goal: home PT Goal Formulation: With patient/family Time For Goal Achievement: 05/12/20 Potential to Achieve Goals: Good Progress towards PT  goals: Progressing toward goals;PT to reassess next treatment (needs progress note next session)    Frequency    Min 3X/week      PT Plan Current plan remains appropriate    Co-evaluation              AM-PAC PT "6 Clicks" Mobility   Outcome Measure  Help needed turning from your back to your side while in a flat bed without using bedrails?: None Help needed moving from lying on your back to sitting on the side of a flat bed without using bedrails?: A Little Help needed moving to and from a bed to a chair (including a wheelchair)?: A Little Help needed standing up from a chair using your arms (e.g., wheelchair or bedside chair)?: A Little Help needed to walk in hospital room?: A Little Help needed climbing 3-5 steps with a railing? : A Little 6 Click Score: 19    End of Session Equipment Utilized During Treatment: Gait belt Activity Tolerance: Patient tolerated treatment well Patient left: with call bell/phone within reach;with family/visitor present;in chair (reclined, pillow under BLE) Nurse Communication: Mobility status;Other (comment) (IV beeping, pt requesting laxative if available) PT Visit Diagnosis: Muscle weakness (generalized) (M62.81);Unsteadiness on feet (R26.81) Pain - Right/Left: Left Pain - part of body: Ankle and joints of foot     Time: 7943-2761 PT Time Calculation (min) (ACUTE ONLY): 31 min  Charges:  $Gait Training: 8-22 mins $Therapeutic Activity: 8-22 mins                     Kiefer Opheim P., PTA Acute Rehabilitation Services Pager: [phone] Office: [place] 05/12/2020, 11:56 AM

## 2020-05-12 NOTE — Addendum Note (Signed)
Addendum  created 05/12/20 0825 by Catalina Gravel, MD   Intraprocedure Staff edited

## 2020-05-12 NOTE — Progress Notes (Signed)
Patient ID: John [name], male   DOB: 07/09/1945, 75 y.o.   MRN: [id number]    Advanced Heart Failure Rounding Note   Subjective:    S/p TAVR (via left subclavian approach) on 05/03/20. Post TAVR Echo : well positioned 29 mm Sapien 3 valve Trivial PVL seen around  EF improved 35-40% post procedure    Remains on lasix gtt at 20/hr. Weight down another 2 pounds. (17 pounds total)  Breathing ok. No orthopnea or PND. Joint pain getting better on prednisone  Creatinine stable 1.4. Co-ox 68% CVP 13-14   Objective:   Weight Range:  Vital Signs:   Temp:  [97.6 F (36.4 C)-98.6 F (37 C)] 98.1 F (36.7 C) (02/17 0830) Pulse Rate:  [60-66] 63 (02/17 0830) Resp:  [17-20] 18 (02/17 0830) BP: (108-127)/(59-68) 118/63 (02/17 0830) SpO2:  [96 %-99 %] 99 % (02/17 0830) Weight:  [99.2 kg] 99.2 kg (02/17 0347) Last BM Date: 05/10/20  Weight change: Filed Weights   05/10/20 0500 05/11/20 0348 05/12/20 0347  Weight: 103 kg 99.8 kg 99.2 kg    Intake/Output:   Intake/Output Summary (Last 24 hours) at 05/12/2020 0920 Last data filed at 05/12/2020 0300 Gross per 24 hour  Intake -  Output 2300 ml  Net -2300 ml     Physical Exam: General:  Lying in bed  No resp difficulty HEENT: normal Neck: supple. no JVD. Carotids 2+ bilat; no bruits. No lymphadenopathy or thryomegaly appreciated. Cor: PMI nondisplaced. Regular rate & rhythm. No rubs, gallops or murmurs. Lungs: clear Abdomen: soft, nontender, nondistended. No hepatosplenomegaly. No bruits or masses. Good bowel sounds. Extremities: no cyanosis, clubbing, rash, edema Neuro: alert & orientedx3, cranial nerves grossly intact. moves all 4 extremities w/o difficulty. Affect pleasant   Telemetry: AF with RV pacing 60 + PVCs. Personally reviewed   Labs: Basic Metabolic Panel: Recent Labs  Lab 05/08/20 0518 05/09/20 0815 05/10/20 0350 05/10/20 0839 05/11/20 0505 05/12/20 0640  NA 131* 131* 131*  --  131* 132*  K 4.5 3.4* 3.2*   --  3.9 3.6  CL 96* 93* 92*  --  94* 93*  CO2 26 28 29   --  28 29  GLUCOSE 165* 154* 107*  --  300* 165*  BUN 25* 26* 24*  --  29* 36*  CREATININE 1.42* 1.29* 1.26*  --  1.36* 1.40*  CALCIUM 8.5* 8.6* 8.7*  --  8.5* 8.7*  MG  --   --   --  2.3  --   --     Liver Function Tests: No results for input(s): AST, ALT, ALKPHOS, BILITOT, PROT, ALBUMIN in the last 168 hours. No results for input(s): LIPASE, AMYLASE in the last 168 hours. No results for input(s): AMMONIA in the last 168 hours.  CBC: Recent Labs  Lab 05/06/20 0300 05/09/20 0815 05/10/20 0350 05/11/20 0505 05/12/20 0640  WBC 8.0 8.9 9.3 10.2 11.5*  HGB 9.4* 9.8* 10.3* 9.6* 10.1*  HCT 29.4* 30.5* 30.4* 28.8* 31.3*  MCV 93.6 93.6 91.3 91.7 93.2  PLT 122* 156 172 167 175    Cardiac Enzymes: No results for input(s): CKTOTAL, CKMB, CKMBINDEX, TROPONINI in the last 168 hours.  BNP: BNP (last 3 results) Recent Labs    03/09/20 1544 03/20/20 1253 04/25/20 1157  BNP 1,540.0* 2,072.0* 2,410.2*    ProBNP (last 3 results) No results for input(s): PROBNP in the last 8760 hours.    Other results:  Imaging: No results found.   Medications:     Scheduled  Medications: . apixaban  5 mg Oral BID  . aspirin  81 mg Oral Daily  . Chlorhexidine Gluconate Cloth  6 each Topical Daily  . dapagliflozin propanediol  10 mg Oral QAC breakfast  . feeding supplement  237 mL Oral TID BM  . insulin aspart  0-20 Units Subcutaneous TID WC  . insulin aspart  0-5 Units Subcutaneous QHS  . insulin aspart  8 Units Subcutaneous TID WC  . insulin glargine  10 Units Subcutaneous QHS  . sodium chloride flush  10-40 mL Intracatheter Q12H    Infusions: . sodium chloride Stopped (05/07/20 1105)  . furosemide (LASIX) 200 mg in dextrose 5% 100 mL (86m/mL) infusion 20 mg/hr (05/11/20 1925)    PRN Medications: sodium chloride, acetaminophen, diclofenac Sodium, morphine injection, ondansetron (ZOFRAN) IV, sodium chloride flush, sodium  chloride flush, traMADol   Assessment/Plan:   1. Acute on Chronic Combined Systolic and Diastolic Heart Failure/NICM: - Echo 2021 EF 30-35%, RV mildly reduced  - Based on timing of drop in EF and 42% pacing percentage, concern for RV pacing-triggered CM. Aortic valve also appears severely calcified and moderate-severely stenosed, suspect AS also contributing to CM. [facility] showed no [facility] disease.  -TAVR 2/8 went well, resolution of AS and improvement in EF by 10%, TTE with similar findings also while he was on dobutamine - Off DBA. Co-ox at 68%. - Remains on lasix gtt at 20/hr. Weight down 17 pounds over the last week.  - CVP 13-14. Continue lasix gtt today. Add diamox. Want to get CVP < 10 if renal function tolerates  - Creatinine stable   2. Aortic Stenosis  - severe low flow/low gradient aortic stenosis with mean gradient 15.5 mmHg, dimensionless index 0.23, AVA 0.86 cm2  -ECHO 2/1 with similar findings mean gradient 21, AVA 0.87 cm2 - s/p successful TAVR procedure 2/8 with resolution of AS  3. Mitral Regurgitation  - Moderate by TEE 8/21. - Severe by TTE 04/26/20 and consistent with severe functional MR - Improved significantly after TAVR now mild-moderate  4. Atrial Flutter/ Chronic Atrial Fibrillation - s/p AFL ablation. -In chronic Afib, rate controlled. - Remains on Eliquis   5. H/o CHB  - s/p PPM followed by Dr. TLovena Le - ? RV paced CM. He is pacing ~40%. - Will need eventual BIV upgrade   6. NASH cirrhosis -followed by Dr. CJenetta Downer NAFLD score 2.90 consistent with advanced fibrosis -Elastography ordered but not yet complete -no prior history of bleeding or known varices  7. Stage IIIb CKD -Followed by nephrology. -Baseline SCr ~1.5-1.8 - Creatine stable  - Follow with diuresis  8. T2DM -On insulin.Hgba1C 6.8 (12/21) -On farxiga - continue basal bolus and SSI insulin.  Continue to adjust due to steroids  9. Chest wall  hematoma - Uncertain etiology. No underlying rib fracture, no RP hematoma.  - Hgb stable at 9.6  10.Swelling around left subclavian access site: - LUE u/s 2/11 No DVT. Good pulses. No evidence of compartment syndrome  - Resolved   11. Polyarticular gout -resulting from diuresis -today complaining of symptoms in multiple joints -continue pred [id number]  Today is day 3/3   12. NSVT -No further episodes -aggressively replace K, check magnesium -when more optimized from a volume stand point may be able to add beta blocker if bp tolerates, will monitor closely for now  Length of Stay: 17   DaGlori BickersD 05/12/2020, 9:20 AM  Advanced Heart Failure Team Pager [phone]-F; 7a -  4p)  Please contact [facility] for night-coverage after hours (4p -7a ) and weekends on [email or web address]    Glori Bickers, MD  9:20 AM

## 2020-05-12 NOTE — Progress Notes (Signed)
CARDIAC REHAB PHASE I   Offered to walk with pt. Pt declining states he walked earlier with mobility. Encouraged continued mobility. Will continue to follow as able.  Rufina Falco, RN BSN 05/11/2020 02:45 PM

## 2020-05-12 NOTE — Progress Notes (Addendum)
Patient requested a medication to help loosen his stools. Verbal order for sorbitol placed. Patient has now decided he will wait until tomorrow for this medication. Medication placed in med room.  Called pharmacy. This medication is good until 1600 on 05/13/2020 according to [place] at pharmacy. Medication in credit bin in med room.  Daymon Larsen, RN

## 2020-05-12 NOTE — Progress Notes (Signed)
CARDIAC REHAB PHASE I   PRE:  Rate/Rhythm: 63 paced with PVCs  BP:  Sitting: 113/62      SaO2: 92 RA  MODE:  Ambulation: 470 ft   POST:  Rate/Rhythm: 101 paced with PVCs  BP:  Sitting: 154/73    SaO2: 96 RA  Pt motivated to walk. Pt ambulated 423f in hallway, motivated to increase distance. Pt states he feels stronger today. Pt denied CP, SOB, or dizziness throughout the walk. Pt returned to recliner, call bell and bedside table within reach. Encouraged continued ambulation. Will continue to follow.  [id number] Falco RN BSN 05/12/2020 1:55 PM

## 2020-05-12 NOTE — Progress Notes (Signed)
CARDIAC REHAB PHASE I   Offered to walk with pt. Pt willing to walk, but in the middle of a bath. Will f/u to continue to encourage ambulation.  Rufina Falco, RN BSN 05/12/2020 10:30 AM

## 2020-05-13 DIAGNOSIS — I350 Nonrheumatic aortic (valve) stenosis: Secondary | ICD-10-CM | POA: Diagnosis not present

## 2020-05-13 DIAGNOSIS — N1832 Chronic kidney disease, stage 3b: Secondary | ICD-10-CM | POA: Diagnosis not present

## 2020-05-13 DIAGNOSIS — I482 Chronic atrial fibrillation, unspecified: Secondary | ICD-10-CM | POA: Diagnosis not present

## 2020-05-13 DIAGNOSIS — I5023 Acute on chronic systolic (congestive) heart failure: Secondary | ICD-10-CM | POA: Diagnosis not present

## 2020-05-13 LAB — CBC
HCT: 32.8 % — ABNORMAL LOW (ref 39.0–52.0)
Hemoglobin: 10.4 g/dL — ABNORMAL LOW (ref 13.0–17.0)
MCH: 29.5 pg (ref 26.0–34.0)
MCHC: 31.7 g/dL (ref 30.0–36.0)
MCV: 92.9 fL (ref 80.0–100.0)
Platelets: 179 10*3/uL (ref 150–400)
RBC: 3.53 MIL/uL — ABNORMAL LOW (ref 4.22–5.81)
RDW: 18.2 % — ABNORMAL HIGH (ref 11.5–15.5)
WBC: 10.6 10*3/uL — ABNORMAL HIGH (ref 4.0–10.5)
nRBC: 0 % (ref 0.0–0.2)

## 2020-05-13 LAB — BASIC METABOLIC PANEL
Anion gap: 11 (ref 5–15)
BUN: 47 mg/dL — ABNORMAL HIGH (ref 8–23)
CO2: 28 mmol/L (ref 22–32)
Calcium: 8.7 mg/dL — ABNORMAL LOW (ref 8.9–10.3)
Chloride: 94 mmol/L — ABNORMAL LOW (ref 98–111)
Creatinine, Ser: 1.4 mg/dL — ABNORMAL HIGH (ref 0.61–1.24)
GFR, Estimated: 53 mL/min — ABNORMAL LOW (ref >60)
Glucose, Bld: 206 mg/dL — ABNORMAL HIGH (ref 70–99)
Potassium: 3.7 mmol/L (ref 3.5–5.1)
Sodium: 133 mmol/L — ABNORMAL LOW (ref 135–145)

## 2020-05-13 LAB — GLUCOSE, CAPILLARY
Glucose-Capillary: 207 mg/dL — ABNORMAL HIGH (ref 70–99)
Glucose-Capillary: 235 mg/dL — ABNORMAL HIGH (ref 70–99)
Glucose-Capillary: 208 mg/dL — ABNORMAL HIGH (ref 70–99)
Glucose-Capillary: 288 mg/dL — ABNORMAL HIGH (ref 70–99)

## 2020-05-13 LAB — COOXEMETRY PANEL
Carboxyhemoglobin: 2.4 % — ABNORMAL HIGH (ref 0.5–1.5)
Methemoglobin: 1.1 % (ref 0.0–1.5)
O2 Saturation: 60.2 %
Total hemoglobin: 10.6 g/dL — ABNORMAL LOW (ref 12.0–16.0)

## 2020-05-13 MED ORDER — COLCHICINE 0.6 MG PO TABS
0.6000 mg | ORAL_TABLET | Freq: Two times a day (BID) | ORAL | Status: DC
  Administered 2020-05-13 – 2020-05-14 (×2): 0.6 mg via ORAL
  Filled 2020-05-13 (×3): qty 1

## 2020-05-13 MED ORDER — ALLOPURINOL 300 MG PO TABS
150.0000 mg | ORAL_TABLET | Freq: Every day | ORAL | Status: DC
  Administered 2020-05-14 – 2020-05-15 (×2): 150 mg via ORAL
  Filled 2020-05-13 (×3): qty 1

## 2020-05-13 MED ORDER — POTASSIUM CHLORIDE CRYS ER 20 MEQ PO TBCR
40.0000 meq | EXTENDED_RELEASE_TABLET | Freq: Four times a day (QID) | ORAL | Status: AC
  Administered 2020-05-13 (×2): 40 meq via ORAL
  Filled 2020-05-13 (×2): qty 2

## 2020-05-13 MED ORDER — INSULIN ASPART 100 UNIT/ML ~~LOC~~ SOLN
0.0000 [IU] | Freq: Three times a day (TID) | SUBCUTANEOUS | Status: DC
  Administered 2020-05-13 (×2): 5 [IU] via SUBCUTANEOUS
  Administered 2020-05-14: 2 [IU] via SUBCUTANEOUS
  Administered 2020-05-14: 3 [IU] via SUBCUTANEOUS
  Administered 2020-05-14: 11 [IU] via SUBCUTANEOUS
  Administered 2020-05-15: 2 [IU] via SUBCUTANEOUS
  Administered 2020-05-15: 8 [IU] via SUBCUTANEOUS

## 2020-05-13 MED ORDER — INSULIN ASPART 100 UNIT/ML ~~LOC~~ SOLN
5.0000 [IU] | Freq: Three times a day (TID) | SUBCUTANEOUS | Status: DC
  Administered 2020-05-13 – 2020-05-15 (×7): 5 [IU] via SUBCUTANEOUS

## 2020-05-13 NOTE — Progress Notes (Signed)
Mobility Specialist: Progress Note   Pre-Mobility: 60 HR Post-Mobility: 94 HR, 131/64 BP, 99% SpO2  Pt asx during ambulation. Pt back to chair per request.   John [name] Mobility Specialist Mobility Specialist Phone: [phone]

## 2020-05-13 NOTE — Progress Notes (Signed)
Physical Therapy Treatment Patient Details Name: John [name] MRN: [id number] DOB: Jun 16, 1945 Today's Date: 05/13/2020    History of Present Illness Pt admit with chronic heart failure and Afib. S/p TAVR 2/8    PT Comments    Pt admitted with above diagnosis. Pt was able to ambulate in hallway and perform 8 steps with min assist. Pt fatigued after 8 steps as it is more fatiguing than anticipated. Discussed with pt and wife that pt will need to place chair at botttom and top of steps to rest between bouts of activity. Pt did walk down to stairwell a good distance therefore feel he could have done a flight with incr time.  Pt does need wife present on stairs and he is aware. Issued pt a gait belt so wife can use prn with activity.  Goal revised today with pt not meeting original goals but is progressing.  Also added stair goal as pt now wants to be able to get to his basement. Pt currently with functional limitations due to balance and endurance deficits. Pt will benefit from skilled PT to increase their independence and safety with mobility to allow discharge to the venue listed below.     Follow Up Recommendations  Home health PT;Supervision/Assistance - 24 hour     Equipment Recommendations  None recommended by PT, issued gait belt   Recommendations for Other Services       Precautions / Restrictions Precautions Precautions: Fall    Mobility  Bed Mobility Overal bed mobility: Needs Assistance Bed Mobility: Rolling;Sit to Sidelying Rolling: Supervision   Supine to sit: Supervision Sit to supine: Supervision        Transfers Overall transfer level: Needs assistance Equipment used: Rolling walker (2 wheeled) Transfers: Sit to/from Stand Sit to Stand: Min guard         General transfer comment: No cues for hand placement.  Ambulation/Gait Ambulation/Gait assistance: Min guard;Supervision Gait Distance (Feet): 500 Feet Assistive device: Rolling walker (2  wheeled) Gait Pattern/deviations: Step-through pattern;Decreased stride length (downward gaze, hunched shoulders)   Gait velocity interpretation: <1.8 ft/sec, indicate of risk for recurrent falls General Gait Details: cues for posture/relaxed shoulders and intermittent cues for proximity to [employer]   Stairs assistance: Min assist Stair Management: One rail Left;Step to pattern Number of Stairs: 8 General stair comments: Pt encouraged to hold onto left rail wtih both hands for steadying and go up sideways for incr steadiness as he has to have HHA on right otherwise.  Pt needed cues for sequencing steps with cues for step-to pattern with strong leg, initial step LOB as pt went up with wrong leg, mostly min guard to step up once pt performing correctly.  Needs assist and cues and wife was updated as such on return to room.  Educated to place a chair at bottom of step and at top to rest.   Engineer, building services [place] (Stroke Patients Only)       Balance Overall balance assessment: Needs assistance Sitting-balance support: No upper extremity supported;Feet supported Sitting balance-Leahy Scale: Fair     Standing balance support: No upper extremity supported Standing balance-Leahy Scale: Poor Standing balance comment: reliant on BUE support of RW, external assist mostly for safety                            Cognition Arousal/Alertness: Awake/alert Behavior During Therapy: WFL for tasks assessed/performed Overall Cognitive Status: Within Functional  Limits for tasks assessed                                        Exercises      General Comments General comments (skin integrity, edema, etc.): VSS      Pertinent Vitals/Pain Pain Assessment: No/denies pain    Home Living                      Prior Function            PT Goals (current goals can now be found in the care plan section) Acute Rehab PT Goals Patient Stated  Goal: home PT Goal Formulation: With patient/family Time For Goal Achievement: 05/27/20 Potential to Achieve Goals: Good Progress towards PT goals: Progressing toward goals    Frequency    Min 3X/week      PT Plan Current plan remains appropriate    Co-evaluation              AM-PAC PT "6 Clicks" Mobility   Outcome Measure  Help needed turning from your back to your side while in a flat bed without using bedrails?: None Help needed moving from lying on your back to sitting on the side of a flat bed without using bedrails?: A Little Help needed moving to and from a bed to a chair (including a wheelchair)?: A Little Help needed standing up from a chair using your arms (e.g., wheelchair or bedside chair)?: A Little Help needed to walk in hospital room?: A Little Help needed climbing 3-5 steps with a railing? : A Little 6 Click Score: 19    End of Session Equipment Utilized During Treatment: Gait belt Activity Tolerance: Patient tolerated treatment well Patient left: with call bell/phone within reach;with family/visitor present;in chair (reclined, pillow under BLE) Nurse Communication: Mobility status PT Visit Diagnosis: Muscle weakness (generalized) (M62.81);Unsteadiness on feet (R26.81) Pain - Right/Left: Left Pain - part of body: Ankle and joints of foot     Time: 1010-1039 PT Time Calculation (min) (ACUTE ONLY): 29 min  Charges:  $Gait Training: 23-37 mins                     Laterrica Libman W,PT Mobile Pager:  [phone]  Office:  [place] 05/13/2020, 1:07 PM

## 2020-05-13 NOTE — Progress Notes (Signed)
CARDIAC REHAB PHASE I   Offered to walk with pt. Pt ambulated twice today and was even able to practice the stairs. Pt states increased strength and endurance. Hopeful for d/c this weekend, but understands in not rushing. Stressed importance of daily weights and monitoring symptoms. Pt and wife deny questions or concerns at this time. Encouraged continued ambulation.  [phone] Rufina Falco, RN BSN 05/13/2020 1:57 PM

## 2020-05-13 NOTE — Progress Notes (Addendum)
Patient ID: John [name], male   DOB: 23-May-1945, 75 y.o.   MRN: [id number]    Advanced Heart Failure Rounding Note   Subjective:    S/p TAVR (via left subclavian approach) on 05/03/20. Post TAVR Echo : well positioned 29 mm Sapien 3 valve Trivial PVL seen around  EF improved 35-40% post procedure    Remains on lasix gtt at 20/hr diamox added yesterday, 4L out yesterday.  CVP now 9 coox 60%  Denies dyspnea, chest pain or PND.  Joint pain improving walking up and down the hall yesterday with less discomfort.     Objective:   Weight Range:  Vital Signs:   Temp:  [97.4 F (36.3 C)-98.3 F (36.8 C)] 97.8 F (36.6 C) (02/18 0756) Pulse Rate:  [60-66] 60 (02/18 0756) Resp:  [14-20] 19 (02/18 0756) BP: (111-118)/(58-63) 112/62 (02/18 0756) SpO2:  [97 %-100 %] 100 % (02/18 0756) Weight:  [97 kg] 97 kg (02/18 0500) Last BM Date: 05/12/20  Weight change: Filed Weights   05/11/20 0348 05/12/20 0347 05/13/20 0500  Weight: 99.8 kg 99.2 kg 97 kg    Intake/Output:   Intake/Output Summary (Last 24 hours) at 05/13/2020 0804 Last data filed at 05/13/2020 0500 Gross per 24 hour  Intake 919.54 ml  Output 3400 ml  Net -2480.46 ml     Physical Exam: Cardiac: JVD flat, normal rate and rhythm, clear s1 and s2, no murmurs, rubs or gallops, no LE edema Pulmonary: CTAB, not in distress Abdominal: non distended abdomen, soft and nontender Psych: Alert, conversant, in good spirits    Telemetry: AF with RV pacing 60 + PVCs. 2-3 Short runs of NSVT 3-6 beats Personally reviewed   Labs: Basic Metabolic Panel: Recent Labs  Lab 05/09/20 0815 05/10/20 0350 05/10/20 0839 05/11/20 0505 05/12/20 0640 05/13/20 0500  NA 131* 131*  --  131* 132* 133*  K 3.4* 3.2*  --  3.9 3.6 3.7  CL 93* 92*  --  94* 93* 94*  CO2 28 29  --  28 29 28   GLUCOSE 154* 107*  --  300* 165* 206*  BUN 26* 24*  --  29* 36* 47*  CREATININE 1.29* 1.26*  --  1.36* 1.40* 1.40*  CALCIUM 8.6* 8.7*  --  8.5* 8.7* 8.7*   MG  --   --  2.3  --   --   --     Liver Function Tests: No results for input(s): AST, ALT, ALKPHOS, BILITOT, PROT, ALBUMIN in the last 168 hours. No results for input(s): LIPASE, AMYLASE in the last 168 hours. No results for input(s): AMMONIA in the last 168 hours.  CBC: Recent Labs  Lab 05/09/20 0815 05/10/20 0350 05/11/20 0505 05/12/20 0640 05/13/20 0500  WBC 8.9 9.3 10.2 11.5* 10.6*  HGB 9.8* 10.3* 9.6* 10.1* 10.4*  HCT 30.5* 30.4* 28.8* 31.3* 32.8*  MCV 93.6 91.3 91.7 93.2 92.9  PLT 156 172 167 175 179    Cardiac Enzymes: No results for input(s): CKTOTAL, CKMB, CKMBINDEX, TROPONINI in the last 168 hours.  BNP: BNP (last 3 results) Recent Labs    03/09/20 1544 03/20/20 1253 04/25/20 1157  BNP 1,540.0* 2,072.0* 2,410.2*    ProBNP (last 3 results) No results for input(s): PROBNP in the last 8760 hours.    Other results:  Imaging: No results found.   Medications:     Scheduled Medications: . acetaZOLAMIDE  250 mg Oral BID  . apixaban  5 mg Oral BID  . aspirin  81 mg  Oral Daily  . Chlorhexidine Gluconate Cloth  6 each Topical Daily  . dapagliflozin propanediol  10 mg Oral QAC breakfast  . feeding supplement  237 mL Oral TID BM  . insulin aspart  0-20 Units Subcutaneous TID WC  . insulin aspart  0-5 Units Subcutaneous QHS  . insulin aspart  8 Units Subcutaneous TID WC  . insulin glargine  10 Units Subcutaneous QHS  . sodium chloride flush  10-40 mL Intracatheter Q12H  . sorbitol  30 mL Oral Once    Infusions: . sodium chloride Stopped (05/07/20 1105)  . furosemide (LASIX) 200 mg in dextrose 5% 100 mL (46m/mL) infusion 20 mg/hr (05/13/20 0759)    PRN Medications: sodium chloride, acetaminophen, diclofenac Sodium, morphine injection, ondansetron (ZOFRAN) IV, sodium chloride flush, sodium chloride flush, traMADol   Assessment/Plan:   1. Acute on Chronic Combined Systolic and Diastolic Heart Failure/NICM: - Echo 2021 EF 30-35%, RV mildly  reduced  - Based on timing of drop in EF and 42% pacing percentage, concern for RV pacing-triggered CM. Aortic valve also appears severely calcified and moderate-severely stenosed, suspect AS also contributing to CM. [facility].  -TAVR 2/8 went well, resolution of AS and improvement in EF by 10%, TTE with similar findings also while he was on dobutamine - Off DBA. Co-ox at 60%. - Remains on lasix gtt at 20/hr Diamox added yesterday.  - Good output 4L net neg 3L and weight down addition 3kg  - CVP better at 9, continue lasix drip can switch to torsemide tomorrow and add spiro  2. Aortic Stenosis  - severe low flow/low gradient aortic stenosis with mean gradient 15.5 mmHg, dimensionless index 0.23, AVA 0.86 cm2  -ECHO 2/1 with similar findings mean gradient 21, AVA 0.87 cm2 - s/p successful TAVR procedure 2/8 with resolution of AS  3. Mitral Regurgitation  - Moderate by TEE 8/21. - Severe by TTE 04/26/20 and consistent with severe functional MR - Improved significantly after TAVR now mild-moderate  4. Atrial Flutter/ Chronic Atrial Fibrillation - s/p AFL ablation. -In chronic Afib, rate controlled. - Remains on Eliquis   5. H/o CHB  - s/p PPM followed by Dr. TLovena Le - ? RV paced CM. He is pacing ~40%. - Will need eventual BIV upgrade   6. NASH cirrhosis -followed by Dr. CJenetta Downer NAFLD score 2.90 consistent with advanced fibrosis -Elastography ordered but not yet complete -no prior history of bleeding or known varices  7. Stage IIIb CKD -Followed by nephrology. -Baseline SCr ~1.5-1.8 - Creatine stable  - Follow with diuresis  8. T2DM -On insulin.Hgba1C 6.8 (12/21) -On farxiga - continue basal bolus and SSI insulin.  Adjust since off steroids  9. Chest wall hematoma - Uncertain etiology. No underlying rib fracture, no RP hematoma.  - Hgb stable at 10.4  10.Swelling around left subclavian access site: - LUE u/s 2/11  No DVT. Good pulses. No evidence of compartment syndrome  - Resolved   11. Polyarticular gout -resulting from diuresis -joint pain improving, finished prednisone, start on colchicine  12. NSVT -Keep K>4, Mg >2 -when more optimized from a volume stand point may be able to add beta blocker if bp tolerates, will monitor closely for now   Length of Stay: 18   WKatherine RoanMD 05/13/2020, 8:04 AM  Advanced Heart Failure Team Pager [phone](M-F; 7[place]  Please contact [facility] for night-coverage after hours (4p -7a ) and weekends on [email or web address]  Katherine Roan, MD  8:04 AM   Patient seen and examined with the above-signed Advanced Practice Provider and/or Housestaff. I personally reviewed laboratory data, imaging studies and relevant notes. I independently examined the patient and formulated the important aspects of the plan. I have edited the note to reflect any of my changes or salient points. I have personally discussed the plan with the patient and/or family.  Now mobilizing fluid well with lasix gtt at 20 and diamox. CVP down to 9. Co-ox 60% Renal functional stable. Still with some gout pain in R index finger, No orthopnea or PND.  General:  Sitting in chair No resp difficulty HEENT: normal Neck: supple. JVP 9. Carotids 2+ bilat; no bruits. No lymphadenopathy or thryomegaly appreciated. Cor: PMI nondisplaced. Regular rate & rhythm. Soft SEM RSB Lungs: clear Abdomen: soft, nontender, nondistended. No hepatosplenomegaly. No bruits or masses. Good bowel sounds. Extremities: no cyanosis, clubbing, rash, edema Neuro: alert & orientedx3, cranial nerves grossly intact. moves all 4 extremities w/o difficulty. Affect pleasant  CVP finally under 10; Urine remains clear. Co-ox and creatinine stable. Continue lasix gtt one more day. Would stop tomorrow and give a day off IV lasix and then switch to torsemide. Possibly home Sunday. Completed prednisone for gout will add  colchicine for mild residual pain. Start allopurinol.    Glori Bickers, MD  10:28 PM

## 2020-05-14 DIAGNOSIS — Z952 Presence of prosthetic heart valve: Secondary | ICD-10-CM | POA: Diagnosis not present

## 2020-05-14 DIAGNOSIS — I5023 Acute on chronic systolic (congestive) heart failure: Secondary | ICD-10-CM | POA: Diagnosis not present

## 2020-05-14 LAB — BASIC METABOLIC PANEL
Anion gap: 11 (ref 5–15)
BUN: 47 mg/dL — ABNORMAL HIGH (ref 8–23)
CO2: 26 mmol/L (ref 22–32)
Calcium: 8.4 mg/dL — ABNORMAL LOW (ref 8.9–10.3)
Chloride: 96 mmol/L — ABNORMAL LOW (ref 98–111)
Creatinine, Ser: 1.26 mg/dL — ABNORMAL HIGH (ref 0.61–1.24)
GFR, Estimated: 60 mL/min — ABNORMAL LOW (ref >60)
Glucose, Bld: 240 mg/dL — ABNORMAL HIGH (ref 70–99)
Potassium: 3.5 mmol/L (ref 3.5–5.1)
Sodium: 133 mmol/L — ABNORMAL LOW (ref 135–145)

## 2020-05-14 LAB — COOXEMETRY PANEL
Carboxyhemoglobin: 2.4 % — ABNORMAL HIGH (ref 0.5–1.5)
Methemoglobin: 0.9 % (ref 0.0–1.5)
O2 Saturation: 73.3 %
Total hemoglobin: 10.9 g/dL — ABNORMAL LOW (ref 12.0–16.0)

## 2020-05-14 LAB — CBC
HCT: 32.6 % — ABNORMAL LOW (ref 39.0–52.0)
Hemoglobin: 10.3 g/dL — ABNORMAL LOW (ref 13.0–17.0)
MCH: 29.4 pg (ref 26.0–34.0)
MCHC: 31.6 g/dL (ref 30.0–36.0)
MCV: 93.1 fL (ref 80.0–100.0)
Platelets: 169 10*3/uL (ref 150–400)
RBC: 3.5 MIL/uL — ABNORMAL LOW (ref 4.22–5.81)
RDW: 18.2 % — ABNORMAL HIGH (ref 11.5–15.5)
WBC: 8.7 10*3/uL (ref 4.0–10.5)
nRBC: 0 % (ref 0.0–0.2)

## 2020-05-14 LAB — GLUCOSE, CAPILLARY
Glucose-Capillary: 142 mg/dL — ABNORMAL HIGH (ref 70–99)
Glucose-Capillary: 305 mg/dL — ABNORMAL HIGH (ref 70–99)
Glucose-Capillary: 156 mg/dL — ABNORMAL HIGH (ref 70–99)
Glucose-Capillary: 149 mg/dL — ABNORMAL HIGH (ref 70–99)

## 2020-05-14 MED ORDER — COLCHICINE 0.6 MG PO TABS
0.6000 mg | ORAL_TABLET | Freq: Every day | ORAL | Status: DC
  Administered 2020-05-15: 0.6 mg via ORAL
  Filled 2020-05-14 (×2): qty 1

## 2020-05-14 MED ORDER — SPIRONOLACTONE 12.5 MG HALF TABLET
12.5000 mg | ORAL_TABLET | Freq: Every day | ORAL | Status: DC
  Administered 2020-05-14 – 2020-05-15 (×2): 12.5 mg via ORAL
  Filled 2020-05-14 (×2): qty 1

## 2020-05-14 NOTE — Progress Notes (Signed)
Mobility Specialist - Progress Note   05/14/20 1332  Mobility  Activity Ambulated in hall  Level of Assistance Standby assist, set-up cues, supervision of patient - no hands on  Assistive Device Front wheel walker  Distance Ambulated (ft) 470 ft  Mobility Response Tolerated well  Mobility performed by Mobility specialist  $Mobility charge 1 Mobility   Pre-mobility: 80 HR During mobility: 97 HR Post-mobility: 79 HR  Asx throughout ambulation. Pt to recliner after walk, wife in room.   Pricilla Handler Mobility Specialist Mobility Specialist Phone: [phone]

## 2020-05-14 NOTE — Plan of Care (Signed)
  Problem: Health Behavior/Discharge Planning: Goal: Ability to manage health-related needs will improve Outcome: Progressing   

## 2020-05-14 NOTE — Progress Notes (Signed)
Patient ID: John [name], male   DOB: 1945/07/18, 75 y.o.   MRN: [id number] P   Advanced Heart Failure Rounding Note   Subjective:    S/p TAVR (via left subclavian approach) on 05/03/20. Post TAVR Echo : well positioned 29 mm Sapien 3 valve Trivial PVL seen around  EF improved 30-35% post procedure    Remains on lasix gtt at 20 mg/hr, good diuresis with CVP down to 5 and weight down 4 lbs.  Co-ox 73% today.   Walked in hall yesterday without problems.   Objective:   Weight Range:  Vital Signs:   Temp:  [97.4 F (36.3 C)-98.6 F (37 C)] 97.4 F (36.3 C) (02/19 0824) Pulse Rate:  [60-65] 60 (02/19 0332) Resp:  [11-19] 12 (02/19 0332) BP: (111-131)/(57-64) 114/59 (02/19 0332) SpO2:  [97 %-100 %] 97 % (02/19 0824) Weight:  [95 kg] 95 kg (02/19 0332) Last BM Date: 05/12/20  Weight change: Filed Weights   05/12/20 0347 05/13/20 0500 05/14/20 0332  Weight: 99.2 kg 97 kg 95 kg    Intake/Output:   Intake/Output Summary (Last 24 hours) at 05/14/2020 1100 Last data filed at 05/14/2020 1009 Gross per 24 hour  Intake 259.23 ml  Output 3475 ml  Net -3215.77 ml     Physical Exam: General: NAD Neck: No JVD, no thyromegaly or thyroid nodule.  Lungs: Mildly decreased BS bilaterally.  CV: Nondisplaced PMI.  Heart regular S1/S2, no S3/S4, 1/6 SEM RUSB.  No peripheral edema.   Abdomen: Soft, nontender, no hepatosplenomegaly, no distention.  Skin: Intact without lesions or rashes.  Neurologic: Alert and oriented x 3.  Psych: Normal affect. Extremities: No clubbing or cyanosis.  HEENT: Normal.   Telemetry: AF with RV pacing in 60s. Personally reviewed   Labs: Basic Metabolic Panel: Recent Labs  Lab 05/10/20 0350 05/10/20 0839 05/11/20 0505 05/12/20 0640 05/13/20 0500 05/14/20 0340  NA 131*  --  131* 132* 133* 133*  K 3.2*  --  3.9 3.6 3.7 3.5  CL 92*  --  94* 93* 94* 96*  CO2 29  --  28 29 28 26   GLUCOSE 107*  --  300* 165* 206* 240*  BUN 24*  --  29* 36* 47* 47*   CREATININE 1.26*  --  1.36* 1.40* 1.40* 1.26*  CALCIUM 8.7*  --  8.5* 8.7* 8.7* 8.4*  MG  --  2.3  --   --   --   --     Liver Function Tests: No results for input(s): AST, ALT, ALKPHOS, BILITOT, PROT, ALBUMIN in the last 168 hours. No results for input(s): LIPASE, AMYLASE in the last 168 hours. No results for input(s): AMMONIA in the last 168 hours.  CBC: Recent Labs  Lab 05/10/20 0350 05/11/20 0505 05/12/20 0640 05/13/20 0500 05/14/20 0340  WBC 9.3 10.2 11.5* 10.6* 8.7  HGB 10.3* 9.6* 10.1* 10.4* 10.3*  HCT 30.4* 28.8* 31.3* 32.8* 32.6*  MCV 91.3 91.7 93.2 92.9 93.1  PLT 172 167 175 179 169    Cardiac Enzymes: No results for input(s): CKTOTAL, CKMB, CKMBINDEX, TROPONINI in the last 168 hours.  BNP: BNP (last 3 results) Recent Labs    03/09/20 1544 03/20/20 1253 04/25/20 1157  BNP 1,540.0* 2,072.0* 2,410.2*    ProBNP (last 3 results) No results for input(s): PROBNP in the last 8760 hours.    Other results:  Imaging: No results found.   Medications:     Scheduled Medications: . allopurinol  150 mg Oral Daily  .  apixaban  5 mg Oral BID  . aspirin  81 mg Oral Daily  . Chlorhexidine Gluconate Cloth  6 each Topical Daily  . colchicine  0.6 mg Oral BID  . dapagliflozin propanediol  10 mg Oral QAC breakfast  . feeding supplement  237 mL Oral TID BM  . insulin aspart  0-15 Units Subcutaneous TID WC  . insulin aspart  0-5 Units Subcutaneous QHS  . insulin aspart  5 Units Subcutaneous TID WC  . insulin glargine  10 Units Subcutaneous QHS  . sodium chloride flush  10-40 mL Intracatheter Q12H  . sorbitol  30 mL Oral Once  . spironolactone  12.5 mg Oral Daily    Infusions: . sodium chloride Stopped (05/07/20 1105)    PRN Medications: sodium chloride, acetaminophen, diclofenac Sodium, morphine injection, ondansetron (ZOFRAN) IV, sodium chloride flush, sodium chloride flush, traMADol   Assessment/Plan:   1. Acute on Chronic Combined Systolic and  Diastolic Heart Failure/NICM: - Echo 2021 EF 30-35%, RV mildly reduced  - Based on timing of drop in EF and 42% pacing percentage, concern for RV pacing-triggered CM. Aortic valve also appears severely calcified and moderate-severely stenosed, suspect AS also contributing to CM. Had recent [facility] 9/21 that showed no coronary disease.  - TAVR 2/8 went well, post-op echo with well-seated valve, EF 30-35%.  - Off DBA. Co-ox 73%. - Remains on lasix gtt at 20 mg/hr, CVP down to 5.  Will stop Lasix gtt today, probably start torsemide 40 mg bid (home dose) tomorrow.  - Can add spironolactone 12.5 mg daily.  - Continue Farxiga 10 mg daily.   2. Aortic Stenosis  - severe low flow/low gradient aortic stenosis with mean gradient 15.5 mmHg, dimensionless index 0.23, AVA 0.86 cm2  -ECHO 2/1 with similar findings mean gradient 21, AVA 0.87 cm2 - s/p successful TAVR procedure 2/8 with well-seated valve on post-op echo.   3. Mitral Regurgitation  - Moderate by TEE 8/21. - Severe by TTE 04/26/20 and consistent with severe functional MR - Improved significantly after TAVR now mild-moderate  4. Atrial Flutter/ Chronic Atrial Fibrillation - s/p AFL ablation. -In chronic Afib, rate controlled. - Remains on Eliquis   5. H/o CHB  - s/p PPM followed by Dr. Lovena Le  - RV pacing may contribute to CMP. He is pacing ~40%. - Will need eventual BIV upgrade   6. NASH cirrhosis -followed by Dr. Jenetta Downer, NAFLD score 2.90 consistent with advanced fibrosis -Elastography ordered but not yet complete -no prior history of bleeding or known varices  7. Stage IIIb CKD -Followed by nephrology. -Baseline SCr ~1.5-1.8 - Creatinine down to 1.26.   8. T2DM -On insulin.Hgba1C 6.8 (12/21) -On farxiga - continue basal bolus and SSI insulin.    9. Chest wall hematoma - Uncertain etiology. No underlying rib fracture, no RP hematoma.   10.Swelling around left subclavian access site: - LUE u/s 2/11 with no  DVT. Good pulses. No evidence of compartment syndrome  - Resolved   11. Polyarticular gout - resulting from diuresis - joint pain improving, finished prednisone, started on colchicine (decrease to qd).   12. NSVT -Keep K>4, Mg >2 -when more optimized from a volume stand point may be able to add beta blocker if bp tolerates, will monitor closely for now   Length of Stay: 19   Loralie Champagne MD 05/14/2020, 11:00 AM  Advanced Heart Failure Team Pager [phone] (M-F; 7a - 4p)  Please contact [facility] for night-coverage after hours (4p -7a ) and weekends  on [email or web address]   Loralie Champagne, MD  11:00 AM 05/14/2020

## 2020-05-14 NOTE — Progress Notes (Signed)
Notified by CCMD that patient had 11 beat run of "aberrant" beats. See monitor for rhythm change. Patient asymptomatic and resting

## 2020-05-14 NOTE — Progress Notes (Signed)
CARDIAC REHAB PHASE I   PRE:  Rate/Rhythm: 61 VP    BP: sitting 114/55    SaO2: 98 RA  MODE:  Ambulation: 470 ft   POST:  Rate/Rhythm: 99 VP with intermittent conduction and occasional PVCs    BP: sitting 134/69     SaO2: 97 RA   1120-1150 Patient sitting in chair upon arrival. Denied complaints. Assisted to standing position and voided 156m clear yellow urine. Ambulated in hallway x 1 assist with RW and IV pole. Steady gait noted. Post ambulation patient back to chair with call bell and phone in reach. Wife at bedside. Discharge education previously completed. Denied need for additional education reinforcement.  Rocquel Askren EMinus BreedingRN, BSN

## 2020-05-14 NOTE — Progress Notes (Signed)
Notified by CCMD that patient had 5 beat run v-tach @2152 . Patient returned to baseline of v-paced. Patient asymptomatic. Will continue to monitor

## 2020-05-15 ENCOUNTER — Encounter (HOSPITAL_COMMUNITY): Payer: Self-pay | Admitting: Internal Medicine

## 2020-05-15 DIAGNOSIS — Z952 Presence of prosthetic heart valve: Secondary | ICD-10-CM | POA: Diagnosis not present

## 2020-05-15 DIAGNOSIS — S20219A Contusion of unspecified front wall of thorax, initial encounter: Secondary | ICD-10-CM

## 2020-05-15 DIAGNOSIS — I5023 Acute on chronic systolic (congestive) heart failure: Secondary | ICD-10-CM | POA: Diagnosis not present

## 2020-05-15 DIAGNOSIS — M109 Gout, unspecified: Secondary | ICD-10-CM

## 2020-05-15 HISTORY — DX: Contusion of unspecified front wall of thorax, initial encounter: S20.219A

## 2020-05-15 HISTORY — DX: Gout, unspecified: M10.9

## 2020-05-15 LAB — COOXEMETRY PANEL
Carboxyhemoglobin: 2.6 % — ABNORMAL HIGH (ref 0.5–1.5)
Methemoglobin: 1.1 % (ref 0.0–1.5)
O2 Saturation: 65.9 %
Total hemoglobin: 11.3 g/dL — ABNORMAL LOW (ref 12.0–16.0)

## 2020-05-15 LAB — GLUCOSE, CAPILLARY
Glucose-Capillary: 136 mg/dL — ABNORMAL HIGH (ref 70–99)
Glucose-Capillary: 253 mg/dL — ABNORMAL HIGH (ref 70–99)
Glucose-Capillary: 200 mg/dL — ABNORMAL HIGH (ref 70–99)

## 2020-05-15 MED ORDER — CARVEDILOL 3.125 MG PO TABS
3.1250 mg | ORAL_TABLET | Freq: Two times a day (BID) | ORAL | Status: DC
  Administered 2020-05-15 (×2): 3.125 mg via ORAL
  Filled 2020-05-15 (×2): qty 1

## 2020-05-15 MED ORDER — POTASSIUM CHLORIDE CRYS ER 20 MEQ PO TBCR: 30.0000 meq | EXTENDED_RELEASE_TABLET | Freq: Two times a day (BID) | ORAL | 6 refills | Status: DC

## 2020-05-15 MED ORDER — POTASSIUM CHLORIDE CRYS ER 20 MEQ PO TBCR
40.0000 meq | EXTENDED_RELEASE_TABLET | Freq: Once | ORAL | Status: AC
  Administered 2020-05-15: 40 meq via ORAL
  Filled 2020-05-15: qty 2

## 2020-05-15 MED ORDER — ASPIRIN 81 MG PO CHEW: 81.0000 mg | CHEWABLE_TABLET | Freq: Every day | ORAL | Status: DC

## 2020-05-15 MED ORDER — CARVEDILOL 3.125 MG PO TABS: 3.1250 mg | ORAL_TABLET | Freq: Two times a day (BID) | ORAL | 6 refills | Status: DC

## 2020-05-15 MED ORDER — ALLOPURINOL 300 MG PO TABS: 150.0000 mg | ORAL_TABLET | Freq: Every day | ORAL | 6 refills | Status: DC

## 2020-05-15 MED ORDER — COLCHICINE 0.6 MG PO TABS: 0.6000 mg | ORAL_TABLET | Freq: Every day | ORAL | 6 refills | Status: DC | PRN

## 2020-05-15 MED ORDER — TORSEMIDE 20 MG PO TABS: 40.0000 mg | ORAL_TABLET | Freq: Two times a day (BID) | ORAL | Status: DC

## 2020-05-15 MED ORDER — SPIRONOLACTONE 25 MG PO TABS: 12.5000 mg | ORAL_TABLET | Freq: Every day | ORAL | 6 refills | Status: DC

## 2020-05-15 MED ORDER — ACETAMINOPHEN 325 MG PO TABS: 650.0000 mg | ORAL_TABLET | ORAL | Status: AC | PRN

## 2020-05-15 NOTE — Progress Notes (Addendum)
Patient ID: John [name], male   DOB: 12-08-1945, 75 y.o.   MRN: [id number] P   Advanced Heart Failure Rounding Note   Subjective:    S/p TAVR (via left subclavian approach) on 05/03/20. Post TAVR Echo : well positioned 29 mm Sapien 3 valve Trivial PVL seen around  EF improved 30-35% post procedure    Lasix gtt stopped yesterday.  CVP 4-5 today.  Creatinine lower at 1.26.  Co-ox 66%.   Walked in hall yesterday without problems.   Objective:   Weight Range:  Vital Signs:   Temp:  [97.7 F (36.5 C)-98.8 F (37.1 C)] 98.2 F (36.8 C) (02/20 0300) Pulse Rate:  [61-89] 62 (02/20 0300) Resp:  [16-20] 16 (02/19 2300) BP: (102-134)/(54-69) 102/64 (02/20 0300) SpO2:  [97 %-98 %] 98 % (02/20 0300) Weight:  [99.1 kg] 99.1 kg (02/20 0300) Last BM Date: 05/12/20  Weight change: Filed Weights   05/13/20 0500 05/14/20 0332 05/15/20 0300  Weight: 97 kg 95 kg 99.1 kg    Intake/Output:   Intake/Output Summary (Last 24 hours) at 05/15/2020 1044 Last data filed at 05/15/2020 0302 Gross per 24 hour  Intake 609.95 ml  Output 1855 ml  Net -1245.05 ml     Physical Exam: General: NAD Neck: No JVD, no thyromegaly or thyroid nodule.  Lungs: Clear to auscultation bilaterally with normal respiratory effort. CV: Nondisplaced PMI.  Heart regular S1/S2, no S3/S4, 1/6 SEM RUSB.  No peripheral edema.   Abdomen: Soft, nontender, no hepatosplenomegaly, no distention.  Skin: Intact without lesions or rashes.  Neurologic: Alert and oriented x 3.  Psych: Normal affect. Extremities: No clubbing or cyanosis.  HEENT: Normal.   Telemetry: AF with RV pacing in 60s. Personally reviewed   Labs: Basic Metabolic Panel: Recent Labs  Lab 05/10/20 0350 05/10/20 0839 05/11/20 0505 05/12/20 0640 05/13/20 0500 05/14/20 0340  NA 131*  --  131* 132* 133* 133*  K 3.2*  --  3.9 3.6 3.7 3.5  CL 92*  --  94* 93* 94* 96*  CO2 29  --  28 29 28 26   GLUCOSE 107*  --  300* 165* 206* 240*  BUN 24*  --  29*  36* 47* 47*  CREATININE 1.26*  --  1.36* 1.40* 1.40* 1.26*  CALCIUM 8.7*  --  8.5* 8.7* 8.7* 8.4*  MG  --  2.3  --   --   --   --     Liver Function Tests: No results for input(s): AST, ALT, ALKPHOS, BILITOT, PROT, ALBUMIN in the last 168 hours. No results for input(s): LIPASE, AMYLASE in the last 168 hours. No results for input(s): AMMONIA in the last 168 hours.  CBC: Recent Labs  Lab 05/10/20 0350 05/11/20 0505 05/12/20 0640 05/13/20 0500 05/14/20 0340  WBC 9.3 10.2 11.5* 10.6* 8.7  HGB 10.3* 9.6* 10.1* 10.4* 10.3*  HCT 30.4* 28.8* 31.3* 32.8* 32.6*  MCV 91.3 91.7 93.2 92.9 93.1  PLT 172 167 175 179 169    Cardiac Enzymes: No results for input(s): CKTOTAL, CKMB, CKMBINDEX, TROPONINI in the last 168 hours.  BNP: BNP (last 3 results) Recent Labs    03/09/20 1544 03/20/20 1253 04/25/20 1157  BNP 1,540.0* 2,072.0* 2,410.2*    ProBNP (last 3 results) No results for input(s): PROBNP in the last 8760 hours.    Other results:  Imaging: No results found.   Medications:     Scheduled Medications: . allopurinol  150 mg Oral Daily  . apixaban  5 mg  Oral BID  . aspirin  81 mg Oral Daily  . Chlorhexidine Gluconate Cloth  6 each Topical Daily  . colchicine  0.6 mg Oral Daily  . dapagliflozin propanediol  10 mg Oral QAC breakfast  . feeding supplement  237 mL Oral TID BM  . insulin aspart  0-15 Units Subcutaneous TID WC  . insulin aspart  0-5 Units Subcutaneous QHS  . insulin aspart  5 Units Subcutaneous TID WC  . insulin glargine  10 Units Subcutaneous QHS  . potassium chloride  40 mEq Oral Once  . sodium chloride flush  10-40 mL Intracatheter Q12H  . sorbitol  30 mL Oral Once  . spironolactone  12.5 mg Oral Daily    Infusions: . sodium chloride Stopped (05/07/20 1105)    PRN Medications: sodium chloride, acetaminophen, diclofenac Sodium, morphine injection, ondansetron (ZOFRAN) IV, sodium chloride flush, sodium chloride flush,  traMADol   Assessment/Plan:   1. Acute on Chronic Combined Systolic and Diastolic Heart Failure/NICM: - Echo 2021 EF 30-35%, RV mildly reduced  - Based on timing of drop in EF and 42% pacing percentage, concern for RV pacing-triggered CM. Aortic valve also appears severely calcified and moderate-severely stenosed, suspect AS also contributing to CM. Had recent [facility] 9/21 that showed no coronary disease.  - TAVR 2/8 went well, post-op echo with well-seated valve, EF 30-35%.  - Off DBA. Co-ox 66%. - Off Lasix gtt, CVP 4-5.  Start torsemide 40 mg bid tomorrow (home dose).  - Continue spironolactone 12.5 mg daily.  - Continue Farxiga 10 mg daily.  - Short NSVT runs, add low dose Coreg 3.125 mg bid.   2. Aortic Stenosis  - severe low flow/low gradient aortic stenosis with mean gradient 15.5 mmHg, dimensionless index 0.23, AVA 0.86 cm2  -ECHO 2/1 with similar findings mean gradient 21, AVA 0.87 cm2 - s/p successful TAVR procedure 2/8 with well-seated valve on post-op echo.   3. Mitral Regurgitation  - Moderate by TEE 8/21. - Severe by TTE 04/26/20 and consistent with severe functional MR - Improved significantly after TAVR now mild-moderate  4. Atrial Flutter/ Chronic Atrial Fibrillation - s/p AFL ablation. -In chronic Afib, rate controlled. - Remains on Eliquis   5. H/o CHB  - s/p PPM followed by Dr. Lovena Le  - RV pacing may contribute to CMP. He is pacing ~40%. - Will need eventual BIV upgrade   6. NASH cirrhosis -followed by Dr. Jenetta Downer, NAFLD score 2.90 consistent with advanced fibrosis -Elastography ordered but not yet complete -no prior history of bleeding or known varices  7. Stage IIIb CKD -Followed by nephrology. -Baseline SCr ~1.5-1.8 - Creatinine down to 1.26.   8. T2DM -On insulin.Hgba1C 6.8 (12/21) -On farxiga - continue basal bolus and SSI insulin.    9. Chest wall hematoma - Uncertain etiology. No underlying rib fracture, no RP hematoma.    10.Swelling around left subclavian access site: - LUE u/s 2/11 with no DVT. Good pulses. No evidence of compartment syndrome  - Resolved   11. Polyarticular gout - resulting from diuresis - joint pain improving, finished prednisone, started on colchicine (decrease to qd).   12. NSVT -Keep K>4, Mg >2 - Add low dose Coreg 3.125 mg bid.   13. Disposition: Home today, followup CHF clinic and structural heart clinic.  Meds for home: Coreg 3.125 mg bid, torsemide 40 mg bid, KCl 30 mEq bid, Farxiga 10 mg daily, spironolactone 12.5 daily, apixaban 5 mg bid, allopurinol 150 daily, colchicine 0.6 mg daily prn gout pain,  ASA 81 daily, home diabetes regimen.   Length of Stay: 20   Loralie Champagne MD 05/15/2020, 10:44 AM  Advanced Heart Failure Team Pager [phone] (M-F; 7a - 4p)  Please contact [facility] for night-coverage after hours (4p -7a ) and weekends on [email or web address]

## 2020-05-15 NOTE — Care Management (Addendum)
Spoke w patient's wife, she states that he gets a Higher education careers adviser and PT that comes out to house twice a week. She could not remember the name of the home health company and it was not identified on [place]. CM instructed her to cal them tomorrow to notify them that the patient has been discharged from the hospital so they can resume services.   Reached out to [facility], patient is active, they will resume [place] services.

## 2020-05-15 NOTE — Progress Notes (Signed)
Notified by CCMD patient had 5 beat run v-tach. Patient returned to v-paced rhythm. Patient asymptomatic. Will continue to monitor

## 2020-05-15 NOTE — Plan of Care (Signed)
  Problem: Activity: Goal: Risk for activity intolerance will decrease Outcome: Progressing   

## 2020-05-15 NOTE — Progress Notes (Signed)
Instructed patient on procedure. HOB less 45* Pressure held for 5 min with no s/sx of bleeding.  Instructed to report s/s of bleeding. Pressure pt/wife drsg to remain in place for 24hrs. Fran Lowes, RN VAST

## 2020-05-15 NOTE — Discharge Summary (Signed)
Discharge Summary    Patient ID: John [name] MRN: [id number]; DOB: 1945-11-28  Admit date: 04/25/2020 Discharge date: 05/15/2020  PCP:  Erven Colla, DO   [place]  Cardiologist:  [name], MD  Advanced Practice Provider:  No care team member to display Electrophysiologist:  [name], MD  Advanced Heart Failure Clinic:  Glori Bickers, MD        Discharge Diagnoses    Principal Problem:   S/P TAVR (transcatheter aortic valve replacement) Active Problems:   Severe aortic stenosis   Acute on chronic systolic CHF (congestive heart failure) ([place])   DM type 2 causing vascular disease ([place])   Mixed hyperlipidemia   Essential hypertension, benign   Current use of long term anticoagulation   NSVT (nonsustained ventricular tachycardia) (HCC)   Pacemaker   CKD (chronic kidney disease) stage 3, GFR 30-59 ml/min (HCC)   Class 2 obesity   NASH (nonalcoholic steatohepatitis)   Permanent atrial fibrillation (HCC)   Chronic atrial fibrillation (HCC)   Hematoma, chest wall   Polyarticular gout    Diagnostic Studies/Procedures    Echo 04/26/20 IMPRESSIONS    1. Left ventricular ejection fraction, by estimation, is 30 to 35%. The  left ventricle has moderately decreased function. The left ventricle  demonstrates global hypokinesis. The left ventricular internal cavity size  was moderately dilated. Left  ventricular diastolic function could not be evaluated.  2. Right ventricular systolic function is mildly reduced. The right  ventricular size is moderately enlarged. There is moderately elevated  pulmonary artery systolic pressure. The estimated right ventricular  systolic pressure is 79.0 mmHg.  3. Left atrial size was severely dilated.  4. Right atrial size was severely dilated.  5. The mitral valve is grossly structurally normal. Severe mitral valve  regurgitation with a central jet, consistent with sec.  6. Tricuspid  valve regurgitation is mild to moderate.  7. Findings suggest severe low flow, low gradient aortic stenosis with  low cardiac output. The aortic valve is functionally bicuspid (fused right  and left cusps). There is moderate calcification of the aortic valve.  There is severe thickening of the  aortic valve. Aortic valve regurgitation is not visualized.  8. The inferior vena cava is dilated in size with <50% respiratory  variability, suggesting right atrial pressure of 15 mmHg.   Comparison(s): A prior study was performed on TEE 11/05/2019. Mitral  insufficiency is substantially worse on the current study.   FINDINGS  Left Ventricle: There is marked systolic dyssynchrony (apex to base and  septum to lateral wall). Left ventricular ejection fraction, by  estimation, is 30 to 35%. The left ventricle has moderately decreased  function. The left ventricle demonstrates  global hypokinesis. The left ventricular internal cavity size was  moderately dilated. There is no left ventricular hypertrophy. Abnormal  (paradoxical) septal motion, consistent with left bundle branch block and  abnormal (paradoxical) septal motion,  consistent with RV pacemaker. Left ventricular diastolic function could  not be evaluated due to atrial fibrillation. Left ventricular diastolic  function could not be evaluated.   Right Ventricle: The right ventricular size is moderately enlarged. No  increase in right ventricular wall thickness. Right ventricular systolic  function is mildly reduced. There is moderately elevated pulmonary artery  systolic pressure. The tricuspid  regurgitant velocity is 2.87 m/s, and with an assumed right atrial  pressure of 15 mmHg, the estimated right ventricular systolic pressure is  38.3 mmHg.   Left Atrium: Left atrial size  was severely dilated.   Right Atrium: Right atrial size was severely dilated.   Pericardium: There is no evidence of pericardial effusion.   Mitral Valve:  There are findings of severe functional MR due to chamber  dilation and leaflet malcoaptation. Quantitation challenging due to atrial  fibrillation (regurgitant volume 50 ml, regurgitant fraction 53%, EROA  0.32 cm), but there is systolic  flow reversal in the pulmonary veins. The mitral valve is grossly normal.  Severe mitral valve regurgitation, with centrally-directed jet. Pulmonary  venous flow shows systolic flow reversal.   Tricuspid Valve: The tricuspid valve is normal in structure. Tricuspid  valve regurgitation is mild to moderate.   Aortic Valve: Findings suggest severe low flow, low gradient aortic  stenosis with low cardiac output. The aortic valve is bicuspid. There is  moderate calcification of the aortic valve. There is severe thickening of  the aortic valve. Aortic valve  regurgitation is not visualized. Severe aortic stenosis is present. Aortic  valve mean gradient measures 21.0 mmHg. Aortic valve peak gradient  measures 39.2 mmHg. Aortic valve area, by VTI measures 0.87 cm.   Pulmonic Valve: The pulmonic valve was normal in structure. Pulmonic valve  regurgitation is not visualized.   Aorta: The aortic root and ascending aorta are structurally normal, with  no evidence of dilitation.   Venous: The inferior vena cava is dilated in size with less than 50%  respiratory variability, suggesting right atrial pressure of 15 mmHg.   IAS/Shunts: No atrial level shunt detected by color flow Doppler.   Additional Comments: A pacer wire is visualized.     Right Heart Cath 04/28/20 Findings:  RA = 12 RV = 51/11 PA = 52/15 (29) PCW = 29 ( v = 44) Fick cardiac output/index = 4.5/2.0 Thermo CO/CI = 5.2/2.3 PVR = < 1.0 WU Ao sat = 110% PA sat = 64%, 66%  Assessment: 1. Markedly elevated left sided filling pressures with prominent v-waves in PCWP tracing 2. Moderate reduced cardiac index 3. SBPs in 90s with severe AS  Plan/Discussion:  Will start low-dose  dobutamine as tolerated to support BP and facilitate further diuresis prior to TAVR.  Glori Bickers, MD    Intraoperative TEE 05/03/20 EF 35-40% PRE-OP FINDINGS  Left Ventricle: The left ventricle has moderate-severely reduced systolic  function, with an ejection fraction of 30-35%. The cavity size was  moderately dilated. There is no increase in left ventricular wall  thickness. Findings are consistent with  idiopathic cardiomyopathy. Left ventrical global hypokinesis without  regional wall motion abnormalities.   Right Ventricle: The right ventricle has mildly reduced systolic function.  The cavity was mildly enlarged. There is no increase in right ventricular  wall thickness.   Left Atrium: Left atrial size was Severely dilated with smoke. The left  atrial appendage is well visualized and there is no evidence of thrombus  present.   Right Atrium: Right atrial size was Severely dilated with pacing wires.  Catheter present in the right atrium.   Interatrial Septum: No atrial level shunt detected by color flow Doppler.   Pericardium: There is no evidence of pericardial effusion.   Mitral Valve: The mitral valve is dilated. Mitral valve regurgitation is  mild by color flow Doppler.   Tricuspid Valve: The tricuspid valve was normal in structure. Tricuspid  valve regurgitation is mild by color flow Doppler.   Aortic Valve: The aortic valve is tricuspid There is severe thickening of  the aortic valve and There is severe calcifcation of the  aortic valve  Aortic valve regurgitation was not visualized by color flow Doppler. There  is severe stenosis of the aortic  valve, with a calculated valve area of 1.58 cm. There is severe aortic  annular calcification noted.   Pulmonic Valve: The pulmonic valve was not assessed.  Pulmonic valve regurgitation was not assessed by color flow Doppler.  ___  ECHO 05/04/20  Post TAVR IMPRESSIONS    1. Diffuse hypokinesis abnormal septal  motion . Left ventricular ejection  fraction, by estimation, is 30 to 35%. The left ventricle has moderately  decreased function. The left ventricle demonstrates global hypokinesis.  The left ventricular internal  cavity size was mildly dilated. Left ventricular diastolic parameters are  consistent with Grade II diastolic dysfunction (pseudonormalization).  Elevated left ventricular end-diastolic pressure.  2. Pacing wires in RV . Right ventricular systolic function was not well  visualized. The right ventricular size is not well visualized. There is  mildly elevated pulmonary artery systolic pressure.  3. The mitral valve is abnormal. Mild to moderate mitral valve  regurgitation.  4. Post TAVR with 29 mm Sapien 3 valve no significant PVL mean gradient 9  peak 19 mmHg AVA 2.8 cm2 DVI 0.42. The aortic valve has been  repaired/replaced. Aortic valve regurgitation is not visualized. There is  a 29 mm Sapien prosthetic (TAVR) valve  present in the aortic position. Procedure Date: 05/03/2020.   FINDINGS  Left Ventricle: Diffuse hypokinesis abnormal septal motion. Left  ventricular ejection fraction, by estimation, is 30 to 35%. The left  ventricle has moderately decreased function. The left ventricle  demonstrates global hypokinesis. The left ventricular  internal cavity size was mildly dilated. There is no left ventricular  hypertrophy. Left ventricular diastolic parameters are consistent with  Grade II diastolic dysfunction (pseudonormalization). Elevated left  ventricular end-diastolic pressure.   Right Ventricle: Pacing wires in RV. The right ventricular size is not  well visualized. Right vetricular wall thickness was not assessed. Right  ventricular systolic function was not well visualized. There is mildly  elevated pulmonary artery systolic  pressure. The tricuspid regurgitant velocity is 3.06 m/s, and with an  assumed right atrial pressure of 3 mmHg, the estimated right  ventricular  systolic pressure is 19.4 mmHg.   Left Atrium: Left atrial size was not assessed.   Right Atrium: Right atrial size was not assessed.   Pericardium: There is no evidence of pericardial effusion.   Mitral Valve: The mitral valve is abnormal. There is moderate thickening  of the mitral valve leaflet(s). There is mild calcification of the mitral  valve leaflet(s). Mild mitral annular calcification. Mild to moderate  mitral valve regurgitation.   Tricuspid Valve: The tricuspid valve is not assessed. Tricuspid valve  regurgitation is mild.   Aortic Valve: Post TAVR with 29 mm Sapien 3 valve no significant PVL mean  gradient 9 peak 19 mmHg AVA 2.8 cm2 DVI 0.42. The aortic valve has been  repaired/replaced. Aortic valve regurgitation is not visualized. Aortic  valve mean gradient measures 8.7  mmHg. Aortic valve peak gradient measures 16.9 mmHg. Aortic valve area, by  VTI measures 2.42 cm. There is a 29 mm Sapien prosthetic, stented (TAVR)  valve present in the aortic position. Procedure Date: 05/03/2020.   Pulmonic Valve: The pulmonic valve was not assessed. Pulmonic valve  regurgitation is not visualized.   Aorta: Aortic root could not be assessed.   IAS/Shunts: The interatrial septum was not assessed.    upper ext venous duplex left 05/06/20 Right:  No evidence of thrombosis in the subclavian.    Left:  No evidence of deep vein thrombosis in the upper extremity. No evidence of  superficial vein thrombosis in the upper extremity.    OP NOTE 05/03/20  Pre-operative Echo Findings: ? Severe aortic stenosis ? Moderate left ventricular systolic dysfunction  Post-operative Echo Findings: ? Trivial paravalvular leak ? Moderate left ventricular systolic dysfunction   BRIEF CLINICAL NOTE AND INDICATIONS FOR SURGERY  The patient is a 75 yo male with a history of atrial flutter s/p ablation, persistent atrial fibrillation on Eliquis, symptomatic bradycardia  status post permanent pacemaker placement, chronic combined systolic and diastolic CHF, non-ischemic cardiomyopathy, mitral regurgitation, CKD stage 3, diabetes mellitus, HTN, hyperlipidemia and severe aortic stenosis. He has had multiple admissions for volume overload and decompensated heart failure. He underwent workup for TAVR and was admitted by the heart failure team again last week with volume overload. He was diuresed and started on Milrinone with improvement. Plans were made for TAVR as an inpatient.  His abdominal and pelvic CTA shows bulky calcification at the aorto-iliac bifurcation protruding into the lumen causing significant obstruction of both common iliacs. Therefore alternative access via the left subclavian artery was planned.  During the course of the patient's preoperative work up they have been evaluated comprehensively by a multidisciplinary team of specialists coordinated through the [facility] in the [facility].  They have been demonstrated to suffer from symptomatic severe aortic stenosis as noted above. The patient has been counseled extensively as to the relative risks and benefits of all options for the treatment of severe aortic stenosis including long term medical therapy, conventional surgery for aortic valve replacement, and transcatheter aortic valve replacement.  All questions have been answered, and the patient provides full informed consent for the operation as described.   DETAILS OF THE OPERATIVE PROCEDURE  PREPARATION:    The patient was brought to the operating room on the above mentioned date and appropriate monitoring was established by the anesthesia team. The patient was placed in the supine position on the operating table.  Intravenous antibiotics were administered. General endotracheal anesthesia was induced uneventfully.    Baseline transesophageal echocardiogram was performed. The patient's neck,  chest, abdomen and both groins were prepped and draped in a sterile manner. A time out procedure was performed.   PERIPHERAL ACCESS:    Using the modified Seldinger technique, femoral arterial and venous access was obtained with placement of 6 Fr sheaths on the right side.  A pigtail diagnostic catheter was passed through the right arterial sheath under fluoroscopic guidance into the aortic root.  A temporary transvenous pacemaker catheter was passed through the right femoral venous sheath under fluoroscopic guidance into the right ventricle.  The pacemaker was tested to ensure stable lead placement and pacemaker capture. Aortic root angiography was performed in order to determine the optimal angiographic angle for valve deployment.   LEFT SUBCLAVIAN ACCESS:   A transverse incision was made below the left clavicle and carried down through the subcutaneous tissue using electrocautery. The pectoralis major muscle was split along its fibers and the pectoralis minor muscle retracted laterally. The left axillary artery was identified and encircled with a vessel loop. The patient was heparinized systemically and ACT verified > 250 seconds.  A double concentric purse string suture of CV-4 gortex was placed in the anterior wall of the artery. The artery was cannulated with a needle and a J- wire advanced into the ascending  aorta. An 8 F sheath was inserted over the wire. The aortic valve was crossed with a JR4 catheter and a straight wire. This was exchanged for a pigtail catheter and position was confirmed in the LV apex. The pigtail catheter was exchanged for an Amplatz Extra-stiff wire in the LV apex.  Then a 9F E-sheath was inserted into the axillary artery and the tip advanced into the aortic arch.  BALLOON AORTIC VALVULOPLASTY:   Not performed.  TRANSCATHETER HEART VALVE DEPLOYMENT:   An Edwards Sapien 3  transcatheter heart valve (size 29 mm) was prepared and crimped per manufacturer's  guidelines, and the proper orientation of the valve is confirmed on the [employer] delivery system. The valve was advanced through the introducer sheath using normal technique until in an appropriate position in the ascending aorta beyond the sheath tip. The balloon was then retracted and using the fine-tuning wheel was centered on the valve. The valve was then advanced across the aortic arch using appropriate flexion of the catheter. The valve was carefully positioned across the aortic valve annulus. The Commander catheter was retracted using normal technique. Once final position of the valve has been confirmed by angiographic assessment, the valve is deployed while temporarily holding ventilation and during rapid ventricular pacing to maintain systolic blood pressure < 50 mmHg and pulse pressure < 10 mmHg. The balloon inflation is held for >3 seconds after reaching full deployment volume. Once the balloon has fully deflated the balloon is retracted into the ascending aorta and valve function is assessed using echocardiography. There is felt to be trivial paravalvular leak and no central aortic insufficiency. Post-procedural gradients were acceptable. The patient's hemodynamic recovery following valve deployment is good.  The deployment balloon and guidewire are both removed.    PROCEDURE COMPLETION:   The sheath was removed and axillary artery closure performed using the Gortex pursestring sutures.  Protamine was administered once axillary arterial repair was complete. The temporary pacemaker, pigtail catheter and femoral sheaths were removed with manual pressure used for hemostasis.  A Mynx femoral closure device was utilized following removal of the diagnostic sheath in the right femoral artery.  The patient tolerated the procedure well and is transported to the PACU in stable condition. There were no immediate intraoperative complications. All sponge instrument and needle counts are verified  correct at completion of the operation.   No blood products were administered during the operation.  The patient received a total of 40 mL of intravenous contrast during the procedure.   Gaye Pollack, MD   History of Present Illness     John [name] is a 75 y.o. male with chronic combined systolic and diastolic HF 2/2 NICM, M6QH, persistent Afib, CHB s/p PPM, NASH cirrhosis.  Had recent [facility] 9/21 showed no significant coronary disease. Most recent 2D echo 7/21 showed LVEF 30-35%. RV mildly reduced. Also concern for moderate to severe MR. AV also noted to be severely calcified. LVOT/AV VTI ratio: 0.23 by TTE. This was followed by TEE 8/21 which showed only moderate MR and moderate TR. AV was noted to be severely thickened/heavily calcified but no stenosis severity rating. Also w/ h/o atrial flutter and stage III CKD (baseline SCr ~2), T2DM, HTN and HLD.   [place] 9/21 with Severity of aortic stenosis also assessed by cath. Findings c/w moderate aortic stenosis (mean gradient 21 mmHg, AVA 1.3 cm^2).  Also moderately elevated filling pressures and moderately reduced CO. CI was 1.8..    Since that time has had admits  for CHF and in Dec CHF and PNA.  Some CHF treated with home health for IV lasix.  He was referred to EP for upgrade of BIV pacer and to structural heart team for TAVR.    On 04/25/20 pt admitted for increasing SOB orthopnea, PND and wt gain.  In ER CXR with pulmonary edema, Cr 1.6 BNP 2,410.  Admitted with acute on chronic combined systolic and diastolic HF, NICM MR, severe AS.  He has NASH cirrhosis followed by Dr. Jenetta Downer.   He was admitted to diuresis and plan for TAVR.   Hospital Course     Consultants: Dr. Cyndia Bent, Dr. Angelena Form   Pt admitted and underwent [place] 04/28/20 and placed on dobutamine support.  Further diuresis with lasix started. Pt scheduled for TAVR.  CVP and co-ox monitored as well.   Pt did develop large hematoma to left chest and abd wall. IV heparin stopped,  but able to resume on 04/30/20. CT done and no rib fracture, the hemorrhage extended into the upper abd body wall region.  On CT bilateral pleural effusion.  Pt had TAVR 05/03/20 and did well. [name] above for details.      CO-ox improved after TAVR.  Dobutamine weaned.  Pt seen by Cardiac rehab. He is ambulating in the halls.  He did develop Lt arm and shoulder pain, but neg for DVT.   Over next several days he continued to improve  Today he was seen and evaluated by Dr. Aundra Dubin and found stable for discharge.  His lasix drip was stopped yesterday the 19th and CVP today is 4-5.  Cr at 1.26.  c0-0x 66%   Wt at discharge 99.1 kg -down from 106.7 kg on admit and total neg 13,555.  Will list problems for ease of transfer of care to outpt.    1. Acute on Chronic Combined Systolic and Diastolic Heart Failure/NICM: - Echo 2021 EF 30-35%, RV mildly reduced  - Based on timing of drop in EF and 42% pacing percentage, concern for RV pacing-triggered CM. Aortic valve also appears severely calcified and moderate-severely stenosed, suspect AS also contributing to CM. Had recent [facility] 9/21 that showed no coronary disease. - TAVR 2/8 went well, post-op echo with well-seated valve, EF 30-35%.  - Off DBA. Co-ox 66%. - Off Lasix gtt, CVP 4-5.  Start torsemide 40 mg bid tomorrow (home dose).  - Continue spironolactone 12.5 mg daily.  - Continue Farxiga 10 mg daily.  - Short NSVT runs, add low dose Coreg 3.125 mg bid. lopressor stopped  2. Aortic Stenosis  - severe low flow/low gradient aortic stenosis with mean gradient 15.5 mmHg, dimensionless index 0.23, AVA 0.86 cm2  -ECHO 2/1 with similar findings mean gradient 21, AVA 0.87 cm2 - s/p successful TAVR procedure 2/8 with well-seated valve on post-op echo.   3. Mitral Regurgitation  - Moderate by TEE 8/21. - Severe by TTE 04/26/20 and consistent with severe functional MR - Improved significantly after TAVR now mild-moderate  4. Atrial Flutter/ Chronic Atrial  Fibrillation - s/p AFL ablation. -In chronic Afib, rate controlled. - Remains on Eliquis   5. H/o CHB  - s/p PPM followed by Dr. Lovena Le  - RV pacing may contribute to CMP. He is pacing~40%. - Will need eventual BIV upgrade   6. NASH cirrhosis -followed by Dr. Jenetta Downer, NAFLD score 2.90 consistent with advanced fibrosis -Elastography ordered but not yet complete -no prior history of bleeding or known varices  7. Stage IIIb CKD -Followed by nephrology. -Baseline SCr ~  1.5-1.8 -Creatinine down to 1.26.   8. T2DM -On insulin.Hgba1C 6.8 (12/21) -On farxiga - continue basal bolus and SSI insulin.    9. Chest wall hematoma - Uncertain etiology. No underlying rib fracture, no RP hematoma.   10.Swelling around left subclavian access site: - LUE u/s 2/11 with no DVT. Good pulses. No evidence of compartment syndrome  - Resolved   11. Polyarticular gout - resulting from diuresis - joint pain improving, finished prednisone, started on colchicine (decrease to qd).   12. NSVT -Keep K>4, Mg >2 - Add low dose Coreg 3.125 mg bid.   Meds for home: Coreg 3.125 mg bid, torsemide 40 mg bid, KCl 30 mEq bid, Farxiga 10 mg daily, spironolactone 12.5 daily, apixaban 5 mg bid, allopurinol 150 daily, colchicine 0.6 mg daily prn gout pain, ASA 81 daily, home diabetes regimen.   Did the patient have an acute coronary syndrome (MI, NSTEMI, STEMI, etc) this admission?:  No                               Did the patient have a percutaneous coronary intervention (stent / angioplasty)?:  No.       _____________  Discharge Vitals Blood pressure 102/64, pulse 62, temperature 98.2 F (36.8 C), temperature source Oral, resp. rate 16, height 6' (1.829 m), weight 99.1 kg, SpO2 98 %.  Filed Weights   05/13/20 0500 05/14/20 0332 05/15/20 0300  Weight: 97 kg 95 kg 99.1 kg    Labs & Radiologic Studies    CBC Recent Labs    05/13/20 0500 05/14/20 0340  WBC 10.6* 8.7  HGB 10.4*  10.3*  HCT 32.8* 32.6*  MCV 92.9 93.1  PLT 179 390   Basic Metabolic Panel Recent Labs    05/13/20 0500 05/14/20 0340  NA 133* 133*  K 3.7 3.5  CL 94* 96*  CO2 28 26  GLUCOSE 206* 240*  BUN 47* 47*  CREATININE 1.40* 1.26*  CALCIUM 8.7* 8.4*   Liver Function Tests No results for input(s): AST, ALT, ALKPHOS, BILITOT, PROT, ALBUMIN in the last 72 hours. No results for input(s): LIPASE, AMYLASE in the last 72 hours. High Sensitivity Troponin:   No results for input(s): TROPONINIHS in the last 720 hours.  BNP Invalid input(s): POCBNP D-Dimer No results for input(s): DDIMER in the last 72 hours. Hemoglobin A1C No results for input(s): HGBA1C in the last 72 hours. Fasting Lipid Panel No results for input(s): CHOL, HDL, LDLCALC, TRIG, CHOLHDL, LDLDIRECT in the last 72 hours. Thyroid Function Tests No results for input(s): TSH, T4TOTAL, T3FREE, THYROIDAB in the last 72 hours.  Invalid input(s): FREET3 _____________  DG Chest 2 View  Result Date: 04/25/2020 CLINICAL DATA:  Shortness of breath for 6 months EXAM: CHEST - 2 VIEW COMPARISON:  03/20/2020 FINDINGS: Left-sided implanted cardiac device. Stable cardiomegaly. Atherosclerotic calcification of the aortic knob. Mild pulmonary vascular congestion with mild diffuse interstitial prominence. No pleural effusion or pneumothorax. IMPRESSION: Cardiomegaly with mild pulmonary vascular congestion and diffuse interstitial prominence, suggesting CHF with mild edema. Electronically Signed   By: Davina Poke D.O.   On: 04/25/2020 12:24   CARDIAC CATHETERIZATION  Result Date: 04/28/2020 Findings: RA = 12 RV = 51/11 PA = 52/15 (29) PCW = 29 ( v = 44) Fick cardiac output/index = 4.5/2.0 Thermo CO/CI = 5.2/2.3 PVR = < 1.0 WU Ao sat = 110% PA sat = 64%, 66% Assessment: 1. Markedly elevated left sided filling  pressures with prominent v-waves in PCWP tracing 2. Moderate reduced cardiac index 3. SBPs in 90s with severe AS Plan/Discussion: Will  start low-dose dobutamine as tolerated to support BP and facilitate further diuresis prior to TAVR. Glori Bickers, MD 2:02 PM   CT CORONARY MORPH W/CTA COR W/SCORE Lewanda Rife W/CM &/OR WO/CM  Addendum Date: 04/20/2020   ADDENDUM REPORT: 04/20/2020 13:16 CLINICAL DATA:  Aortic stenosis EXAM: Cardiac TAVR CT TECHNIQUE: The patient was scanned on a Siemens Force 284 slice scanner. A 120 kV retrospective scan was triggered in the descending thoracic aorta at 111 HU's. Gantry rotation speed was 270 msecs and collimation was .9 mm. No beta blockade or nitro were given. The 3D data set was reconstructed in 5% intervals of the R-R cycle. Systolic and diastolic phases were analyzed on a dedicated work station using MPR, MIP and VRT modes. The patient received 80 cc of contrast. FINDINGS: Aortic Valve: Tri leaflet calcified with restricted leaflet motion Calcium score for valve 1819 Aorta: No aneurysm mild calcific atherosclerosis Sinotubular Junction: 29 mm Ascending Thoracic Aorta: 33 mm Aortic Arch: 30 mm Descending Thoracic Aorta: 25 mm Sinus of Valsalva Measurements: Non-coronary: 34 mm Right - coronary: 27.7 mm Left - coronary: 32.6 mm Coronary Artery Height above Annulus: Left Main: 13.6 mm above annulus Right Coronary: 15.5 mm above annulus Virtual Basal Annulus Measurements: Maximum/Minimum Diameter: 31.6 mm x 21.5 mm Elliptical Perimeter: 90.6 mm Area: 589.5 mm2 Coronary Arteries: Sufficient height above annulus for deployment Pacing wires in RA/RV Optimum Fluoroscopic Angle for Delivery: LAO 15 Caudal 14 degrees IMPRESSION: 1. Tri-leaflet AV with calcium score 1819 and annular area of 589 mm2 suitable for a 29 mm Sapien 3 valve 2.  Coronary arteries sufficient height above annulus for deployment 3. Optimum angiographic angle for deployment LAO 15 Caudal 14 degrees 4.  Normal aortic root 3.3 cm Jenkins Rouge Electronically Signed   By: Jenkins Rouge M.D.   On: 04/20/2020 13:16   Result Date: 04/20/2020 EXAM:  OVER-READ INTERPRETATION  CT CHEST The following report is an over-read performed by radiologist Dr. Vinnie Langton of [facility], [place] on 04/20/2020. This over-read does not include interpretation of cardiac or coronary anatomy or pathology. The coronary calcium score/coronary CTA interpretation by the cardiologist is attached. COMPARISON:  None. FINDINGS: Extracardiac findings will be described separately under dictation for contemporaneously obtained CTA chest, abdomen and pelvis. IMPRESSION: Please [name] separate dictation for contemporaneously obtained CTA chest, abdomen and pelvis dated 04/20/2020 for full description of relevant extracardiac findings. Electronically Signed: By: Vinnie Langton M.D. On: 04/20/2020 12:02   DG Chest Port 1 View  Result Date: 05/03/2020 CLINICAL DATA:  Post TAVR EXAM: PORTABLE CHEST 1 VIEW COMPARISON:  04/25/2020 FINDINGS: Left pacer remains in place, unchanged. Changes of TAVR. Right PICC line in place with the tip at the cavoatrial junction. Cardiomegaly. Vascular congestion and bilateral interstitial and airspace opacities, likely edema. Small bilateral effusions. No pneumothorax. IMPRESSION: Changes of TAVR. Cardiomegaly.  Mild CHF. Small bilateral effusions. Electronically Signed   By: Rolm Baptise M.D.   On: 05/03/2020 20:22   DG CHEST PORT 1 VIEW  Result Date: 04/25/2020 CLINICAL DATA:  PICC placement. EXAM: PORTABLE CHEST 1 VIEW COMPARISON:  Radiograph earlier today.  CT 04/20/2020 FINDINGS: Right upper extremity PICC tip in the mid SVC. No pneumothorax. Left-sided pacemaker in place. Stable cardiomegaly. Right pleural effusion appears similar to prior. Underlying interstitial opacities suspicious for pulmonary edema, also unchanged. Stable osseous structures. IMPRESSION: 1. Right upper extremity PICC tip  in the mid SVC. No pneumothorax. 2. Stable cardiomegaly, pulmonary edema and right pleural effusion. Electronically Signed   By: Keith Rake M.D.    On: 04/25/2020 21:43   CT ANGIO CHEST AORTA W/CM & OR WO/CM  Result Date: 04/21/2020 CLINICAL DATA:  75 year old male with history of severe aortic stenosis. Preprocedural study prior to potential transcatheter aortic valve replacement (TAVR) procedure. EXAM: CT ANGIOGRAPHY CHEST, ABDOMEN AND PELVIS TECHNIQUE: Multidetector CT imaging through the chest, abdomen and pelvis was performed using the standard protocol during bolus administration of intravenous contrast. Multiplanar reconstructed images and MIPs were obtained and reviewed to evaluate the vascular anatomy. CONTRAST:  79m OMNIPAQUE IOHEXOL 350 MG/ML SOLN COMPARISON:  No priors. FINDINGS: CTA CHEST FINDINGS Cardiovascular: Heart size is mildly enlarged. There is no significant pericardial fluid, thickening or pericardial calcification. There is aortic atherosclerosis, as well as atherosclerosis of the great vessels of the mediastinum and the coronary arteries, including calcified atherosclerotic plaque in the left main, left anterior descending, left circumflex and right coronary arteries. Severe thickening calcification of the aortic valve. Left-sided pacemaker device in place with lead tips terminating in the right atrium and right ventricular apex. Aberrant right subclavian artery (normal anatomical variant) incidentally noted. Mediastinum/Lymph Nodes: No pathologically enlarged mediastinal or hilar lymph nodes. Esophagus is unremarkable in appearance. No axillary lymphadenopathy. Lungs/Pleura: Moderate right and small left pleural effusions lying dependently. Patchy areas of ground-glass attenuation, septal thickening and thickening of the peribronchovascular interstitium in the lungs bilaterally, favored to reflect a background of some mild interstitial pulmonary edema. No consolidative airspace disease. Musculoskeletal/Soft Tissues: There are no aggressive appearing lytic or blastic lesions noted in the visualized portions of the skeleton. CTA  ABDOMEN AND PELVIS FINDINGS Hepatobiliary: Liver has a shrunken appearance and nodular contour, indicative of underlying cirrhosis. No discrete cystic or solid hepatic lesions. No intra or extrahepatic biliary ductal dilatation. Numerous partially calcified gallstones lying dependently in the gallbladder. No findings to suggest an acute cholecystitis at this time. Pancreas: No pancreatic mass. No pancreatic ductal dilatation. No pancreatic or peripancreatic fluid collections or inflammatory changes. Spleen: Unremarkable. Adrenals/Urinary Tract: Bilateral kidneys and bilateral adrenal glands are normal in appearance. No hydroureteronephrosis. Urinary bladder is nearly decompressed, but otherwise unremarkable in appearance. Stomach/Bowel: Normal appearance of the stomach. No pathologic dilatation of small bowel or colon. Well-defined low-attenuation lesion measuring 5.4 x 3.4 cm extending off the inferior aspect of the third portion of the duodenum with some faint calcifications associated with the wall, similar to numerous prior examinations dating back to 2006, presumably a benign lesions such as a duplication cyst. No pathologic dilatation of small bowel or colon. Numerous colonic diverticulae are noted, particularly in the sigmoid colon, without definite surrounding inflammatory changes to suggest an acute diverticulitis at this time. The appendix is not confidently identified and may be surgically absent. Regardless, there are no inflammatory changes noted adjacent to the cecum to suggest the presence of an acute appendicitis at this time. Vascular/Lymphatic: Aortic atherosclerosis, with vascular findings and measurements pertinent to potential TAVR procedure, as detailed above. No aneurysm or dissection noted in the abdominal or pelvic vasculature. Multiple varicose veins identified in the deep pelvis. No lymphadenopathy noted in the abdomen or pelvis. Reproductive: Prostate gland and seminal vesicles are  unremarkable in appearance. Other: No significant volume of ascites.  No pneumoperitoneum. Musculoskeletal: There are no aggressive appearing lytic or blastic lesions noted in the visualized portions of the skeleton. VASCULAR MEASUREMENTS PERTINENT TO TAVR: AORTA: Minimal Aortic Diameter-16 x 14  mm Severity of Aortic Calcification-severe RIGHT PELVIS: Right Common Iliac Artery - Minimal Diameter-11.1 x 5.7 mm Tortuosity-mild Calcification-moderate to severe Right External Iliac Artery - Minimal Diameter-7.8 x 7.3 mm Tortuosity-severe Calcification-mild Right Common Femoral Artery - Minimal Diameter-7.6 x 7.7 mm Tortuosity-mild Calcification-mild LEFT PELVIS: Left Common Iliac Artery - Minimal Diameter-11.5 x 4.4 mm Tortuosity-mild Calcification-moderate to severe Left External Iliac Artery - Minimal Diameter-7.4 x 7.8 mm Tortuosity-severe Calcification-none Left Common Femoral Artery - Minimal Diameter-8.2 x 6.0 mm Tortuosity-mild Calcification-mild Review of the MIP images confirms the above findings. IMPRESSION: 1. Vascular findings and measurements pertinent to potential TAVR procedure, as detailed above. 2. Severe thickening calcification of the aortic valve, compatible with reported clinical history of severe aortic stenosis. 3. Mild cardiomegaly with evidence of probable congestive heart failure, including moderate right and small left pleural effusions. 4. Aortic atherosclerosis, in addition to left main and 3 vessel coronary artery disease. 5. Cirrhosis. 6. Colonic diverticulosis. 7. Cholelithiasis. 8. Additional incidental findings, as above. Electronically Signed   By: Vinnie Langton M.D.   On: 04/21/2020 07:29   ECHOCARDIOGRAM COMPLETE  Result Date: 04/26/2020    ECHOCARDIOGRAM REPORT   Patient Name:   John [name] Date of Exam: 04/26/2020 Medical Rec #:  [id number]        Height:       72.0 in Accession #:    [id number]       Weight:       235.2 lb Date of Birth:  [birth year]        BSA:           2.283 m Patient Age:    59 years         BP:           122/76 mmHg Patient Gender: M                HR:           67 bpm. Exam Location:  Inpatient Procedure: 2D Echo, Color Doppler and Cardiac Doppler Indications:    Aortic Stenosis i35.0  History:        Patient has prior history of Echocardiogram examinations, most                 recent 11/06/2019. CHF, Pacemaker, Arrythmias:Atrial                 Fibrillation; Risk Factors:Hypertension, Diabetes and                 Dyslipidemia.  Sonographer:    Raquel Sarna Senior RDCS Referring Phys: [id number] DANIEL R BENSIMHON IMPRESSIONS  1. Left ventricular ejection fraction, by estimation, is 30 to 35%. The left ventricle has moderately decreased function. The left ventricle demonstrates global hypokinesis. The left ventricular internal cavity size was moderately dilated. Left ventricular diastolic function could not be evaluated.  2. Right ventricular systolic function is mildly reduced. The right ventricular size is moderately enlarged. There is moderately elevated pulmonary artery systolic pressure. The estimated right ventricular systolic pressure is 16.3 mmHg.  3. Left atrial size was severely dilated.  4. Right atrial size was severely dilated.  5. The mitral valve is grossly structurally normal. Severe mitral valve regurgitation with a central jet, consistent with sec.  6. Tricuspid valve regurgitation is mild to moderate.  7. Findings suggest severe low flow, low gradient aortic stenosis with low cardiac output. The aortic valve is functionally bicuspid (fused right and left cusps). There is moderate calcification of the aortic valve.  There is severe thickening of the aortic valve. Aortic valve regurgitation is not visualized.  8. The inferior vena cava is dilated in size with <50% respiratory variability, suggesting right atrial pressure of 15 mmHg. Comparison(s): A prior study was performed on TEE 11/05/2019. Mitral insufficiency is substantially worse on the current  study. FINDINGS  Left Ventricle: There is marked systolic dyssynchrony (apex to base and septum to lateral wall). Left ventricular ejection fraction, by estimation, is 30 to 35%. The left ventricle has moderately decreased function. The left ventricle demonstrates global hypokinesis. The left ventricular internal cavity size was moderately dilated. There is no left ventricular hypertrophy. Abnormal (paradoxical) septal motion, consistent with left bundle branch block and abnormal (paradoxical) septal motion, consistent with RV pacemaker. Left ventricular diastolic function could not be evaluated due to atrial fibrillation. Left ventricular diastolic function could not be evaluated. Right Ventricle: The right ventricular size is moderately enlarged. No increase in right ventricular wall thickness. Right ventricular systolic function is mildly reduced. There is moderately elevated pulmonary artery systolic pressure. The tricuspid regurgitant velocity is 2.87 m/s, and with an assumed right atrial pressure of 15 mmHg, the estimated right ventricular systolic pressure is 23.5 mmHg. Left Atrium: Left atrial size was severely dilated. Right Atrium: Right atrial size was severely dilated. Pericardium: There is no evidence of pericardial effusion. Mitral Valve: There are findings of severe functional MR due to chamber dilation and leaflet malcoaptation. Quantitation challenging due to atrial fibrillation (regurgitant volume 50 ml, regurgitant fraction 53%, EROA 0.32 cm), but there is systolic flow reversal in the pulmonary veins. The mitral valve is grossly normal. Severe mitral valve regurgitation, with centrally-directed jet. Pulmonary venous flow shows systolic flow reversal. Tricuspid Valve: The tricuspid valve is normal in structure. Tricuspid valve regurgitation is mild to moderate. Aortic Valve: Findings suggest severe low flow, low gradient aortic stenosis with low cardiac output. The aortic valve is bicuspid. There  is moderate calcification of the aortic valve. There is severe thickening of the aortic valve. Aortic valve regurgitation is not visualized. Severe aortic stenosis is present. Aortic valve mean gradient measures 21.0 mmHg. Aortic valve peak gradient measures 39.2 mmHg. Aortic valve area, by VTI measures 0.87 cm. Pulmonic Valve: The pulmonic valve was normal in structure. Pulmonic valve regurgitation is not visualized. Aorta: The aortic root and ascending aorta are structurally normal, with no evidence of dilitation. Venous: The inferior vena cava is dilated in size with less than 50% respiratory variability, suggesting right atrial pressure of 15 mmHg. IAS/Shunts: No atrial level shunt detected by color flow Doppler. Additional Comments: A pacer wire is visualized.  LEFT VENTRICLE PLAX 2D LVIDd:         6.40 cm      Diastology LVIDs:         4.90 cm      LV e' medial:    3.98 cm/s LV PW:         1.00 cm      LV E/e' medial:  27.6 LV IVS:        0.80 cm      LV e' lateral:   9.30 cm/s LVOT diam:     2.30 cm      LV E/e' lateral: 11.8 LV SV:         52 LV SV Index:   23 LVOT Area:     4.15 cm  LV Volumes (MOD) LV vol d, MOD A2C: 155.0 ml LV vol d, MOD A4C: 154.0 ml LV vol s,  MOD A2C: 93.9 ml LV vol s, MOD A4C: 95.0 ml LV SV MOD A2C:     61.1 ml LV SV MOD A4C:     154.0 ml LV SV MOD BP:      61.1 ml RIGHT VENTRICLE RV S prime:     6.38 cm/s TAPSE (M-mode): 1.6 cm LEFT ATRIUM              Index       RIGHT ATRIUM           Index LA diam:        5.60 cm  2.45 cm/m  RA Area:     21.80 cm LA Vol (A2C):   103.0 ml 45.12 ml/m RA Volume:   59.10 ml  25.89 ml/m LA Vol (A4C):   64.5 ml  28.26 ml/m LA Biplane Vol: 89.1 ml  39.03 ml/m  AORTIC VALVE AV Area (Vmax):    0.79 cm AV Area (Vmean):   0.79 cm AV Area (VTI):     0.87 cm AV Vmax:           313.00 cm/s AV Vmean:          208.000 cm/s AV VTI:            0.600 m AV Peak Grad:      39.2 mmHg AV Mean Grad:      21.0 mmHg LVOT Vmax:         59.20 cm/s LVOT Vmean:         39.600 cm/s LVOT VTI:          0.125 m LVOT/AV VTI ratio: 0.21  AORTA Ao Root diam: 2.70 cm Ao Asc diam:  3.30 cm MITRAL VALVE                 TRICUSPID VALVE MV Area (PHT): 4.21 cm      TR Peak grad:   32.9 mmHg MV Decel Time: 180 msec      TR Vmax:        287.00 cm/s MR Peak grad:    93.3 mmHg MR Mean grad:    63.0 mmHg   SHUNTS MR Vmax:         483.00 cm/s Systemic VTI:  0.12 m MR Vmean:        375.0 cm/s  Systemic Diam: 2.30 cm MR PISA:         4.02 cm MR PISA Eff ROA: 32 mm MR PISA Radius:  0.80 cm MV E velocity: 110.00 cm/s Dani Gobble Croitoru MD Electronically signed by Sanda Klein MD Signature Date/Time: 04/26/2020/1:56:20 PM    Final    ECHO INTRAOPERATIVE TEE  Result Date: 05/03/2020  *INTRAOPERATIVE TRANSESOPHAGEAL REPORT *  Patient Name:   John [name] Date of Exam: 05/03/2020 Medical Rec #:  [id number]        Height:       72.0 in Accession #:    [id number]       Weight:       230.6 lb Date of Birth:  05-22-1945        BSA:          2.26 m Patient Age:    75 years         BP:           91/67 mmHg Patient Gender: M                HR:  81 bpm. Exam Location:  Inpatient Transesophogeal exam was perform intraoperatively during surgical procedure. Patient was closely monitored under general anesthesia during the entirety of examination. Indications:     I35.0 Nonrheumatic aortic (valve) stenosis Performing Phys: [id number] Woodfin Ganja THOMPSON Diagnosing Phys: Jenkins Rouge MD PROCEDURE: Intraoperative Transesophogeal TAVR procedure. Complications: No known complications during this procedure. POST-OP IMPRESSIONS TAVR : performed from left subclavian access. Introop TEE with 3D imaging Pre TAVR severe AS with tri leaflet AV no AR men gradient 28 mmHg peak 49 mmHg in setting of moderately reduced EF AVA 0.9 cm2. Post TAVR: well positioned 29 mm Sapien 3 valve Trivial PVL seen around 1:00 on SA images mean gradient 5 peak 10 mmHg AVA 2.3 cm2. EF improved 35-40% post procedure. PRE-OP FINDINGS  Left  Ventricle: The left ventricle has moderate-severely reduced systolic function, with an ejection fraction of 30-35%. The cavity size was moderately dilated. There is no increase in left ventricular wall thickness. Findings are consistent with idiopathic cardiomyopathy. Left ventrical global hypokinesis without regional wall motion abnormalities. Right Ventricle: The right ventricle has mildly reduced systolic function. The cavity was mildly enlarged. There is no increase in right ventricular wall thickness. Left Atrium: Left atrial size was Severely dilated with smoke. The left atrial appendage is well visualized and there is no evidence of thrombus present. Right Atrium: Right atrial size was Severely dilated with pacing wires. Catheter present in the right atrium. Interatrial Septum: No atrial level shunt detected by color flow Doppler. Pericardium: There is no evidence of pericardial effusion. Mitral Valve: The mitral valve is dilated. Mitral valve regurgitation is mild by color flow Doppler. Tricuspid Valve: The tricuspid valve was normal in structure. Tricuspid valve regurgitation is mild by color flow Doppler. Aortic Valve: The aortic valve is tricuspid There is severe thickening of the aortic valve and There is severe calcifcation of the aortic valve Aortic valve regurgitation was not visualized by color flow Doppler. There is severe stenosis of the aortic valve, with a calculated valve area of 1.58 cm. There is severe aortic annular calcification noted. Pulmonic Valve: The pulmonic valve was not assessed. Pulmonic valve regurgitation was not assessed by color flow Doppler. +--------------+--------++ LEFT VENTRICLE         +--------------+--------++ PLAX 2D                +--------------+--------++ LVOT diam:    2.10 cm  +--------------+--------++ LVOT Area:    3.46 cm +--------------+--------++                        +--------------+--------++ +------------------+------------++ AORTIC  VALVE                   +------------------+------------++ AV Area (Vmax):   1.54 cm     +------------------+------------++ AV Area (Vmean):  1.37 cm     +------------------+------------++ AV Area (VTI):    1.58 cm     +------------------+------------++ AV Vmax:          270.33 cm/s  +------------------+------------++ AV Vmean:         195.333 cm/s +------------------+------------++ AV VTI:           0.535 m      +------------------+------------++ AV Peak Grad:     29.2 mmHg    +------------------+------------++ AV Mean Grad:     18.3 mmHg    +------------------+------------++ LVOT Vmax:        120.00 cm/s  +------------------+------------++ LVOT Vmean:       77.450  cm/s  +------------------+------------++ LVOT VTI:         0.244 m      +------------------+------------++ LVOT/AV VTI ratio:0.46         +------------------+------------++  +--------------+-------+ SHUNTS                +--------------+-------+ Systemic VTI: 0.24 m  +--------------+-------+ Systemic Diam:2.10 cm +--------------+-------+  Jenkins Rouge MD Electronically signed by Jenkins Rouge MD Signature Date/Time: 05/03/2020/5:46:21 PM    Final    VAS US CAROTID  Result Date: 04/20/2020 Carotid Arterial Duplex Study Indications:   Pre-TAVR. Risk Factors:  Hypertension, hyperlipidemia, Diabetes, coronary artery disease. Other Factors: Severe aortic stenosis, CHF, PM, CKD3,. Performing Technologist: Rogelia Rohrer  Examination Guidelines: A complete evaluation includes B-mode imaging, spectral Doppler, color Doppler, and power Doppler as needed of all accessible portions of each vessel. Bilateral testing is considered an integral part of a complete examination. Limited examinations for reoccurring indications may be performed as noted.  Right Carotid Findings: +----------+--------+--------+--------+------------------+------------------+           PSV cm/sEDV cm/sStenosisPlaque  DescriptionComments           +----------+--------+--------+--------+------------------+------------------+ CCA Prox  33      8                                 intimal thickening +----------+--------+--------+--------+------------------+------------------+ CCA Distal24      8                                 intimal thickening +----------+--------+--------+--------+------------------+------------------+ ICA Prox  21      8       1-39%                                        +----------+--------+--------+--------+------------------+------------------+ ICA Distal30      11                                                   +----------+--------+--------+--------+------------------+------------------+ ECA       26      3                                                    +----------+--------+--------+--------+------------------+------------------+ +----------+--------+-------+--------+-------------------+           PSV cm/sEDV cmsDescribeArm Pressure (mmHG) +----------+--------+-------+--------+-------------------+ [id number]      0                                  +----------+--------+-------+--------+-------------------+ +---------+--------+--+--------+-+---------+ VertebralPSV cm/s34EDV cm/s9Antegrade +---------+--------+--+--------+-+---------+  Left Carotid Findings: +----------+-------+-------+--------+------------------------+-----------------+           PSV    EDV    StenosisPlaque Description      Comments                    cm/s   cm/s                                                     +----------+-------+-------+--------+------------------------+-----------------+  CCA Prox  33     9              irregular and           intimal                                           heterogenous            thickening        +----------+-------+-------+--------+------------------------+-----------------+ CCA Distal29     10                                      intimal                                                                   thickening        +----------+-------+-------+--------+------------------------+-----------------+ ICA Prox  34     14     1-39%                                             +----------+-------+-------+--------+------------------------+-----------------+ ICA Distal49     18                                                       +----------+-------+-------+--------+------------------------+-----------------+ ECA       32     5                                                        +----------+-------+-------+--------+------------------------+-----------------+ +----------+--------+--------+--------+-------------------+           PSV cm/sEDV cm/sDescribeArm Pressure (mmHG) +----------+--------+--------+--------+-------------------+ Subclavian102     0                                   +----------+--------+--------+--------+-------------------+ +---------+--------+--+--------+-+---------+ VertebralPSV cm/s22EDV cm/s8Antegrade +---------+--------+--+--------+-+---------+   Summary: Right Carotid: Velocities in the right ICA are consistent with a 1-39% stenosis.                The extracranial vessels were near-normal with only minimal wall                thickening or plaque. Left Carotid: Velocities in the left ICA are consistent with a 1-39% stenosis.               The extracranial vessels were near-normal with only minimal wall               thickening or plaque. Vertebrals: Bilateral vertebral arteries demonstrate antegrade flow. *[name] table(s) above for measurements and observations.  Electronically signed by  Harold Barban MD on 04/20/2020 at 9:41:50 PM.    Final    VAS [place] UPPER EXTREMITY VENOUS DUPLEX  Result Date: 05/07/2020 UPPER VENOUS STUDY  Indications: Pain and swelling in LT upper extremity. Risk Factors: Surgery 05-03-2020 TAVR LT subclavian. Anticoagulation:  Eliquis. Limitations: Limited mobility due to pain. Comparison Study: No prior studies. Performing Technologist: Darlin Coco RDMS  Examination Guidelines: A complete evaluation includes B-mode imaging, spectral Doppler, color Doppler, and power Doppler as needed of all accessible portions of each vessel. Bilateral testing is considered an integral part of a complete examination. Limited examinations for reoccurring indications may be performed as noted.  Right Findings: +----------+------------+---------+-----------+----------+---------------------+ RIGHT     CompressiblePhasicitySpontaneousProperties       Summary        +----------+------------+---------+-----------+----------+---------------------+ Subclavian               No        Yes               Unable to compress                                                       secondary to patient                                                             anatomy        +----------+------------+---------+-----------+----------+---------------------+  Left Findings: +----------+------------+---------+-----------+----------+-------+ LEFT      CompressiblePhasicitySpontaneousPropertiesSummary +----------+------------+---------+-----------+----------+-------+ IJV           Full       No        Yes                      +----------+------------+---------+-----------+----------+-------+ Subclavian    Full       No        Yes                      +----------+------------+---------+-----------+----------+-------+ Axillary      Full       No        Yes                      +----------+------------+---------+-----------+----------+-------+ Brachial      Full                                          +----------+------------+---------+-----------+----------+-------+ Radial        Full                                          +----------+------------+---------+-----------+----------+-------+ Ulnar          Full                                          +----------+------------+---------+-----------+----------+-------+  Cephalic      Full                                          +----------+------------+---------+-----------+----------+-------+ Basilic       Full                                          +----------+------------+---------+-----------+----------+-------+  Summary:  Right: No evidence of thrombosis in the subclavian.  Left: No evidence of deep vein thrombosis in the upper extremity. No evidence of superficial vein thrombosis in the upper extremity.  *[name] table(s) above for measurements and observations.  Diagnosing physician: Harold Barban MD Electronically signed by Harold Barban MD on 05/07/2020 at 8:00:36 PM.    Final    ECHOCARDIOGRAM LIMITED  Result Date: 05/04/2020    ECHOCARDIOGRAM REPORT   Patient Name:   John [name] Date of Exam: 05/04/2020 Medical Rec #:  [id number]        Height:       72.0 in Accession #:    [id number]       Weight:       219.8 lb Date of Birth:  07/19/1945        BSA:          2.218 m Patient Age:    39 years         BP:           115/51 mmHg Patient Gender: M                HR:           63 bpm. Exam Location:  Inpatient Procedure: Limited Echo, Limited Color Doppler and Cardiac Doppler Indications:    Post TAVR evaluation V43.3 / Z95.2  History:        Patient has prior history of Echocardiogram examinations, most                 recent 05/03/2020. CHF, Pacemaker, Aortic Valve Disease and Mitral                 Valve Disease, Arrythmias:Atrial Flutter and Atrial                 Fibrillation; Risk Factors:Diabetes. Chronic kidney disease.                 Aortic Valve: 29 mm Sapien prosthetic, stented (TAVR) valve is                 present in the aortic position. Procedure Date: 05/03/2020.  Sonographer:    Darlina Sicilian RDCS Referring Phys: [id number] [place]  1. Diffuse hypokinesis abnormal septal motion . Left ventricular ejection  fraction, by estimation, is 30 to 35%. The left ventricle has moderately decreased function. The left ventricle demonstrates global hypokinesis. The left ventricular internal cavity size was mildly dilated. Left ventricular diastolic parameters are consistent with Grade II diastolic dysfunction (pseudonormalization). Elevated left ventricular end-diastolic pressure.  2. Pacing wires in RV . Right ventricular systolic function was not well visualized. The right ventricular size is not well visualized. There is mildly elevated pulmonary artery systolic pressure.  3. The mitral valve is abnormal. Mild to moderate mitral valve regurgitation.  4. Post TAVR with 29 mm Sapien 3 valve no  significant PVL mean gradient 9 peak 19 mmHg AVA 2.8 cm2 DVI 0.42. The aortic valve has been repaired/replaced. Aortic valve regurgitation is not visualized. There is a 29 mm Sapien prosthetic (TAVR) valve present in the aortic position. Procedure Date: 05/03/2020. FINDINGS  Left Ventricle: Diffuse hypokinesis abnormal septal motion. Left ventricular ejection fraction, by estimation, is 30 to 35%. The left ventricle has moderately decreased function. The left ventricle demonstrates global hypokinesis. The left ventricular internal cavity size was mildly dilated. There is no left ventricular hypertrophy. Left ventricular diastolic parameters are consistent with Grade II diastolic dysfunction (pseudonormalization). Elevated left ventricular end-diastolic pressure. Right Ventricle: Pacing wires in RV. The right ventricular size is not well visualized. Right vetricular wall thickness was not assessed. Right ventricular systolic function was not well visualized. There is mildly elevated pulmonary artery systolic pressure. The tricuspid regurgitant velocity is 3.06 m/s, and with an assumed right atrial pressure of 3 mmHg, the estimated right ventricular systolic pressure is 51.0 mmHg. Left Atrium: Left atrial size was not assessed. Right Atrium:  Right atrial size was not assessed. Pericardium: There is no evidence of pericardial effusion. Mitral Valve: The mitral valve is abnormal. There is moderate thickening of the mitral valve leaflet(s). There is mild calcification of the mitral valve leaflet(s). Mild mitral annular calcification. Mild to moderate mitral valve regurgitation. Tricuspid Valve: The tricuspid valve is not assessed. Tricuspid valve regurgitation is mild. Aortic Valve: Post TAVR with 29 mm Sapien 3 valve no significant PVL mean gradient 9 peak 19 mmHg AVA 2.8 cm2 DVI 0.42. The aortic valve has been repaired/replaced. Aortic valve regurgitation is not visualized. Aortic valve mean gradient measures 8.7 mmHg. Aortic valve peak gradient measures 16.9 mmHg. Aortic valve area, by VTI measures 2.42 cm. There is a 29 mm Sapien prosthetic, stented (TAVR) valve present in the aortic position. Procedure Date: 05/03/2020. Pulmonic Valve: The pulmonic valve was not assessed. Pulmonic valve regurgitation is not visualized. Aorta: Aortic root could not be assessed. IAS/Shunts: The interatrial septum was not assessed.  LEFT VENTRICLE PLAX 2D LVIDd:         5.80 cm  Diastology LVIDs:         4.20 cm  LV e' medial:    4.28 cm/s LV PW:         0.90 cm  LV E/e' medial:  22.5 LV IVS:        0.90 cm  LV e' lateral:   5.48 cm/s LVOT diam:     2.70 cm  LV E/e' lateral: 17.6 LV SV:         81 LV SV Index:   36 LVOT Area:     5.73 cm  AORTIC VALVE AV Area (Vmax):    2.54 cm AV Area (Vmean):   2.77 cm AV Area (VTI):     2.42 cm AV Vmax:           205.67 cm/s AV Vmean:          137.000 cm/s AV VTI:            0.333 m AV Peak Grad:      16.9 mmHg AV Mean Grad:      8.7 mmHg LVOT Vmax:         91.40 cm/s LVOT Vmean:        66.200 cm/s LVOT VTI:          0.141 m LVOT/AV VTI ratio: 0.42 MITRAL VALVE  TRICUSPID VALVE MV Area (PHT): 5.84 cm      TR Peak grad:   37.5 mmHg MV Decel Time: 130 msec      TR Vmax:        306.00 cm/s MR Peak grad:    60.8 mmHg  MR Mean grad:    42.0 mmHg   SHUNTS MR Vmax:         390.00 cm/s Systemic VTI:  0.14 m MR Vmean:        303.0 cm/s  Systemic Diam: 2.70 cm MR PISA:         1.57 cm MR PISA Eff ROA: 13 mm MR PISA Radius:  0.50 cm MV E velocity: 96.40 cm/s MV A velocity: 74.10 cm/s MV E/A ratio:  1.30 Jenkins Rouge MD Electronically signed by Jenkins Rouge MD Signature Date/Time: 05/04/2020/1:51:43 PM    Final    [place] EKG SITE RITE  Result Date: 04/25/2020 If Site Rite image not attached, placement could not be confirmed due to current cardiac rhythm.  Structural Heart Procedure  Result Date: 05/03/2020 [name] surgical note for result.  CT CHEST ABDOMEN PELVIS WO CONTRAST  Result Date: 04/30/2020 CLINICAL DATA:  Chest pain and shortness of breath. Anticoagulated. Left chest wall hematoma. EXAM: CT CHEST, ABDOMEN AND PELVIS WITHOUT CONTRAST TECHNIQUE: Multidetector CT imaging of the chest, abdomen and pelvis was performed following the standard protocol without IV contrast. COMPARISON:  04/20/2020 FINDINGS: CT CHEST FINDINGS Cardiovascular: Cardiomegaly. Dual lead pacemaker in place. Calcification of the aortic valve region. Coronary artery calcification. Aortic atherosclerotic calcification. Mediastinum/Nodes: No mediastinal or hilar mass or lymphadenopathy. Lungs/Pleura: Bilateral pleural effusions layering dependently with dependent pulmonary atelectasis. Volume loss particularly in both lower lobes. Some hyperdense material could possibly relate to aspiration. Musculoskeletal: Hematoma of the left mid to lower posterolateral chest wall. No sign of underlying rib fracture. CT ABDOMEN PELVIS FINDINGS Hepatobiliary: Cirrhosis of the liver. Gallstones dependent in the gallbladder. No evidence of cholecystitis or obstruction by CT. Pancreas: Normal Spleen: Normal Adrenals/Urinary Tract: Adrenal glands are normal. Kidneys are normal. Bladder shows mild wall thickening. Stomach/Bowel: No acute bowel pathology. Diverticulosis without CT  evidence of diverticulitis. Normal appendix. Cyst with some wall calcifications inferior to the fourth portion of the duodenum as seen chronically. Vascular/Lymphatic: Aortic atherosclerotic calcification. No aneurysm. No retroperitoneal bleeding. Reproductive: Enlarged prostate. Other: Bilateral inguinal hernias. Small amount of fluid in the left hernia. Left chest wall hemorrhage extends into the upper abdominal region. Musculoskeletal: Degenerative change of the spine and hips. IMPRESSION: 1. Hematoma of the left mid to lower posterolateral chest wall. No sign of underlying rib fracture. Left chest wall hemorrhage extends into the upper abdominal body wall region. 2. Bilateral pleural effusions layering dependently with dependent pulmonary atelectasis. Some hyperdense material in the lower lobes could possibly relate to aspiration. 3. Cirrhosis of the liver. 4. Cholelithiasis without CT evidence of cholecystitis or obstruction. 5. Bilateral inguinal hernias. Small amount of fluid in the left hernia. 6. Benign appearing cyst with some wall calcifications inferior to the fourth portion of the duodenum as seen chronically. Aortic Atherosclerosis (ICD10-I70.0). Electronically Signed   By: Nelson Chimes M.D.   On: 04/30/2020 00:40   CT Angio Abd/Pel w/ and/or w/o  Result Date: 04/21/2020 CLINICAL DATA:  75 year old male with history of severe aortic stenosis. Preprocedural study prior to potential transcatheter aortic valve replacement (TAVR) procedure. EXAM: CT ANGIOGRAPHY CHEST, ABDOMEN AND PELVIS TECHNIQUE: Multidetector CT imaging through the chest, abdomen and pelvis was performed using the standard protocol  during bolus administration of intravenous contrast. Multiplanar reconstructed images and MIPs were obtained and reviewed to evaluate the vascular anatomy. CONTRAST:  56m OMNIPAQUE IOHEXOL 350 MG/ML SOLN COMPARISON:  No priors. FINDINGS: CTA CHEST FINDINGS Cardiovascular: Heart size is mildly enlarged.  There is no significant pericardial fluid, thickening or pericardial calcification. There is aortic atherosclerosis, as well as atherosclerosis of the great vessels of the mediastinum and the coronary arteries, including calcified atherosclerotic plaque in the left main, left anterior descending, left circumflex and right coronary arteries. Severe thickening calcification of the aortic valve. Left-sided pacemaker device in place with lead tips terminating in the right atrium and right ventricular apex. Aberrant right subclavian artery (normal anatomical variant) incidentally noted. Mediastinum/Lymph Nodes: No pathologically enlarged mediastinal or hilar lymph nodes. Esophagus is unremarkable in appearance. No axillary lymphadenopathy. Lungs/Pleura: Moderate right and small left pleural effusions lying dependently. Patchy areas of ground-glass attenuation, septal thickening and thickening of the peribronchovascular interstitium in the lungs bilaterally, favored to reflect a background of some mild interstitial pulmonary edema. No consolidative airspace disease. Musculoskeletal/Soft Tissues: There are no aggressive appearing lytic or blastic lesions noted in the visualized portions of the skeleton. CTA ABDOMEN AND PELVIS FINDINGS Hepatobiliary: Liver has a shrunken appearance and nodular contour, indicative of underlying cirrhosis. No discrete cystic or solid hepatic lesions. No intra or extrahepatic biliary ductal dilatation. Numerous partially calcified gallstones lying dependently in the gallbladder. No findings to suggest an acute cholecystitis at this time. Pancreas: No pancreatic mass. No pancreatic ductal dilatation. No pancreatic or peripancreatic fluid collections or inflammatory changes. Spleen: Unremarkable. Adrenals/Urinary Tract: Bilateral kidneys and bilateral adrenal glands are normal in appearance. No hydroureteronephrosis. Urinary bladder is nearly decompressed, but otherwise unremarkable in  appearance. Stomach/Bowel: Normal appearance of the stomach. No pathologic dilatation of small bowel or colon. Well-defined low-attenuation lesion measuring 5.4 x 3.4 cm extending off the inferior aspect of the third portion of the duodenum with some faint calcifications associated with the wall, similar to numerous prior examinations dating back to 2006, presumably a benign lesions such as a duplication cyst. No pathologic dilatation of small bowel or colon. Numerous colonic diverticulae are noted, particularly in the sigmoid colon, without definite surrounding inflammatory changes to suggest an acute diverticulitis at this time. The appendix is not confidently identified and may be surgically absent. Regardless, there are no inflammatory changes noted adjacent to the cecum to suggest the presence of an acute appendicitis at this time. Vascular/Lymphatic: Aortic atherosclerosis, with vascular findings and measurements pertinent to potential TAVR procedure, as detailed above. No aneurysm or dissection noted in the abdominal or pelvic vasculature. Multiple varicose veins identified in the deep pelvis. No lymphadenopathy noted in the abdomen or pelvis. Reproductive: Prostate gland and seminal vesicles are unremarkable in appearance. Other: No significant volume of ascites.  No pneumoperitoneum. Musculoskeletal: There are no aggressive appearing lytic or blastic lesions noted in the visualized portions of the skeleton. VASCULAR MEASUREMENTS PERTINENT TO TAVR: AORTA: Minimal Aortic Diameter-16 x 14 mm Severity of Aortic Calcification-severe RIGHT PELVIS: Right Common Iliac Artery - Minimal Diameter-11.1 x 5.7 mm Tortuosity-mild Calcification-moderate to severe Right External Iliac Artery - Minimal Diameter-7.8 x 7.3 mm Tortuosity-severe Calcification-mild Right Common Femoral Artery - Minimal Diameter-7.6 x 7.7 mm Tortuosity-mild Calcification-mild LEFT PELVIS: Left Common Iliac Artery - Minimal Diameter-11.5 x 4.4 mm  Tortuosity-mild Calcification-moderate to severe Left External Iliac Artery - Minimal Diameter-7.4 x 7.8 mm Tortuosity-severe Calcification-none Left Common Femoral Artery - Minimal Diameter-8.2 x 6.0 mm Tortuosity-mild Calcification-mild Review of the  MIP images confirms the above findings. IMPRESSION: 1. Vascular findings and measurements pertinent to potential TAVR procedure, as detailed above. 2. Severe thickening calcification of the aortic valve, compatible with reported clinical history of severe aortic stenosis. 3. Mild cardiomegaly with evidence of probable congestive heart failure, including moderate right and small left pleural effusions. 4. Aortic atherosclerosis, in addition to left main and 3 vessel coronary artery disease. 5. Cirrhosis. 6. Colonic diverticulosis. 7. Cholelithiasis. 8. Additional incidental findings, as above. Electronically Signed   By: Vinnie Langton M.D.   On: 04/21/2020 07:29   Disposition   Pt is being discharged home today in good condition.  Follow-up Plans & Appointments     Follow-up Information    Eileen Stanford, PA-C Follow up on 06/08/2020.   Specialties: Cardiology, Radiology Why: with Katie for structural heart disease and echo at 1:00 pm prior to her appt.  Contact information: [address] [place] [address] [phone]        Bensimhon, Shaune Pascal, MD. Call.   Specialty: Cardiology Why: call tomorrow 05/16/20 to schedule follow up appt.   Contact information: [address] [place] [address] [phone]        Evans Lance, MD Follow up on 08/10/2020.   Specialty: Cardiology Why: at 1115 AM Contact information: [place] [address] [phone]        Arnoldo Lenis, MD Follow up on 08/15/2020.   Specialty: Cardiology Why: at 3:00 PM Contact information: [address] [place] [address] [phone]              Discharge Instructions    Amb  Referral to Cardiac Rehabilitation   Complete by: As directed    Diagnosis: Valve Replacement   Valve: Aortic Comment - TAVR   After initial evaluation and assessments completed: Virtual Based Care may be provided alone or in conjunction with Phase 2 Cardiac Rehab based on patient barriers.: Yes      Discharge Medications   Allergies as of 05/15/2020   No Known Allergies     Medication List    STOP taking these medications   aspirin-sod bicarb-citric acid 325 MG Tbef tablet Commonly known as: ALKA-SELTZER   metoprolol tartrate 50 MG tablet Commonly known as: LOPRESSOR     TAKE these medications   Accu-Chek Aviva Plus test strip Generic drug: glucose blood TEST BLOOD SUGAR TWICE DAILY BEFORE BREAKFAST AND AT BEDTIME What changed: [name] the new instructions.   acetaminophen 325 MG tablet Commonly known as: TYLENOL Take 2 tablets (650 mg total) by mouth every 4 (four) hours as needed for headache or mild pain.   allopurinol 300 MG tablet Commonly known as: ZYLOPRIM Take 0.5 tablets (150 mg total) by mouth daily. Start taking on: May 16, 2020   apixaban 5 MG Tabs tablet Commonly known as: Eliquis Take 1 tablet (5 mg total) by mouth 2 (two) times daily.   aspirin 81 MG chewable tablet Chew 1 tablet (81 mg total) by mouth daily. Start taking on: May 16, 2020   BD Pen Needle Nano U/F 32G X 4 MM Misc Generic drug: Insulin Pen Needle 1 each by Does not apply route 4 (four) times daily. What changed: when to take this   benzonatate 100 MG capsule Commonly known as: TESSALON Take 1 capsule (100 mg total) by mouth 3 (three) times daily as needed for cough.   carvedilol 3.125 MG tablet Commonly known as: COREG  Take 1 tablet (3.125 mg total) by mouth 2 (two) times daily with a meal.   colchicine 0.6 MG tablet Take 1 tablet (0.6 mg total) by mouth daily as needed.   dapagliflozin propanediol 10 MG Tabs tablet Commonly known as: Farxiga Take 1 tablet (10 mg  total) by mouth daily before breakfast.   diphenoxylate-atropine 2.5-0.025 MG tablet Commonly known as: LOMOTIL Take 1 tablet by mouth 4 (four) times daily as needed for diarrhea or loose stools.   HYDROcodone-acetaminophen 5-325 MG tablet Commonly known as: NORCO/VICODIN Take one tablet po every 4 hrs prn pain What changed:   how much to take  how to take this  when to take this  reasons to take this  additional instructions   One-A-Day Mens 50+ Tabs Take 1 tablet by mouth daily with breakfast.   potassium chloride SA 20 MEQ tablet Commonly known as: KLOR-CON Take 1.5 tablets (30 mEq total) by mouth 2 (two) times daily.   spironolactone 25 MG tablet Commonly known as: ALDACTONE Take 0.5 tablets (12.5 mg total) by mouth daily. Start taking on: May 16, 2020   torsemide 20 MG tablet Commonly known as: DEMADEX Take 2 tablets (40 mg total) by mouth 2 (two) times daily.   Tyler Aas FlexTouch 200 UNIT/ML FlexTouch Pen Generic drug: insulin degludec Inject 50 Units into the skin at bedtime. What changed: how much to take   Trulicity 1.5 GO/7.7CH Sopn Generic drug: Dulaglutide INJECT 1.5MG (1 PEN) SUBCUTANEOUSLY EVERY WEEK What changed: [name] the new instructions.          Outstanding Labs/Studies   BMP on visit with advanced HF  Duration of Discharge Encounter   Greater than 30 minutes including physician time.  Signed, Cecilie Kicks, NP 05/15/2020, 12:14 PM

## 2020-05-15 NOTE — Progress Notes (Signed)
Mobility Specialist - Progress Note   05/15/20 1131  Mobility  Activity Ambulated in hall  Level of Assistance Standby assist, set-up cues, supervision of patient - no hands on  Assistive Device Front wheel walker  Distance Ambulated (ft) 470 ft  Mobility Response Tolerated well  Mobility performed by Mobility specialist  $Mobility charge 1 Mobility   Pt asx throughout ambulation. VSS. Pt to recliner after walk.   Pricilla Handler Mobility Specialist Mobility Specialist Phone: [phone]

## 2020-05-16 ENCOUNTER — Other Ambulatory Visit: Payer: Self-pay

## 2020-05-16 NOTE — Patient Outreach (Signed)
[facility]) Care Management  05/16/2020  [name] [birth year] [id number]   [facility] [[facility]] Patient: John [name]  Placed a telephone call to the number provided as the patient transitioned home to assess for post hospital follow up needs with chronic complex disease management related to length of stay.  Was only able to leave a generic voice mail with a return call request and number provided.  Natividad Brood, RN BSN [facility] Liaison  [phone] business mobile phone Toll free office [phone]  Fax number: [phone] [email or web address]

## 2020-05-18 ENCOUNTER — Telehealth: Payer: Self-pay | Admitting: *Deleted

## 2020-05-18 ENCOUNTER — Encounter: Payer: Self-pay | Admitting: *Deleted

## 2020-05-18 DIAGNOSIS — I5023 Acute on chronic systolic (congestive) heart failure: Secondary | ICD-10-CM

## 2020-05-18 NOTE — Telephone Encounter (Signed)
-----   Message from Evans Lance, MD sent at 04/25/2020  1:50 PM EST ----- Alda Berthold, can you call and get him scheduled for PM upgrade to a biv device. GT ----- Message ----- From: Burnell Blanks, MD Sent: 04/25/2020   7:31 AM EST To: Jolaine Artist, MD, Barkley Boards, RN, #  Carleene Overlie, I think based on Dan and Cub's previous comments, that what they were thinking. Gerald Stabs  ----- Message ----- From: Evans Lance, MD Sent: 04/23/2020   8:29 PM EST To: Burnell Blanks, MD  Gerald Stabs, would you like for me to reach out to him to schedule biv upgrade?GT ----- Message ----- From: Burnell Blanks, MD Sent: 04/22/2020  11:34 AM EST To: Jolaine Artist, MD, Barkley Boards, RN, #  Lauren, Let's talk about him in the meeting Tuesday. Gerald Stabs  ----- Message ----- From: Jolaine Artist, MD Sent: 04/20/2020   6:57 PM EST To: Barkley Boards, RN, Rexene Alberts, MD, #  Thanks Cub  I favor upgrade to BiV first to try ans stabilize his cardiomyopathy followed by TAVR several weeks later. Happy to admit him at any point for a tune-up as needed.    ----- Message ----- From: Rexene Alberts, MD Sent: 04/20/2020   4:39 PM EST To: Jolaine Artist, MD, Barkley Boards, RN, #  Please see the attached note regarding our mutual patient from their visit in our office today.  CHO

## 2020-05-18 NOTE — Telephone Encounter (Signed)
Pt scheduled for upgrade to PM Biv device on March 4 at 2:30 pm.

## 2020-05-20 ENCOUNTER — Other Ambulatory Visit: Payer: Self-pay

## 2020-05-20 DIAGNOSIS — I5042 Chronic combined systolic (congestive) and diastolic (congestive) heart failure: Secondary | ICD-10-CM | POA: Diagnosis not present

## 2020-05-20 DIAGNOSIS — E1122 Type 2 diabetes mellitus with diabetic chronic kidney disease: Secondary | ICD-10-CM | POA: Diagnosis not present

## 2020-05-20 DIAGNOSIS — J9601 Acute respiratory failure with hypoxia: Secondary | ICD-10-CM | POA: Diagnosis not present

## 2020-05-20 DIAGNOSIS — I081 Rheumatic disorders of both mitral and tricuspid valves: Secondary | ICD-10-CM | POA: Diagnosis not present

## 2020-05-20 DIAGNOSIS — K8000 Calculus of gallbladder with acute cholecystitis without obstruction: Secondary | ICD-10-CM | POA: Diagnosis not present

## 2020-05-20 DIAGNOSIS — J181 Lobar pneumonia, unspecified organism: Secondary | ICD-10-CM | POA: Diagnosis not present

## 2020-05-20 DIAGNOSIS — I0981 Rheumatic heart failure: Secondary | ICD-10-CM | POA: Diagnosis not present

## 2020-05-20 DIAGNOSIS — N184 Chronic kidney disease, stage 4 (severe): Secondary | ICD-10-CM | POA: Diagnosis not present

## 2020-05-20 DIAGNOSIS — I130 Hypertensive heart and chronic kidney disease with heart failure and stage 1 through stage 4 chronic kidney disease, or unspecified chronic kidney disease: Secondary | ICD-10-CM | POA: Diagnosis not present

## 2020-05-23 DIAGNOSIS — I5042 Chronic combined systolic (congestive) and diastolic (congestive) heart failure: Secondary | ICD-10-CM | POA: Diagnosis not present

## 2020-05-23 DIAGNOSIS — Z48812 Encounter for surgical aftercare following surgery on the circulatory system: Secondary | ICD-10-CM | POA: Diagnosis not present

## 2020-05-23 DIAGNOSIS — I110 Hypertensive heart disease with heart failure: Secondary | ICD-10-CM | POA: Diagnosis not present

## 2020-05-23 DIAGNOSIS — S81812D Laceration without foreign body, left lower leg, subsequent encounter: Secondary | ICD-10-CM | POA: Diagnosis not present

## 2020-05-23 DIAGNOSIS — Z950 Presence of cardiac pacemaker: Secondary | ICD-10-CM | POA: Diagnosis not present

## 2020-05-23 DIAGNOSIS — M6281 Muscle weakness (generalized): Secondary | ICD-10-CM | POA: Diagnosis not present

## 2020-05-23 DIAGNOSIS — E119 Type 2 diabetes mellitus without complications: Secondary | ICD-10-CM | POA: Diagnosis not present

## 2020-05-23 DIAGNOSIS — M109 Gout, unspecified: Secondary | ICD-10-CM | POA: Diagnosis not present

## 2020-05-23 DIAGNOSIS — Z952 Presence of prosthetic heart valve: Secondary | ICD-10-CM | POA: Diagnosis not present

## 2020-05-24 ENCOUNTER — Telehealth (INDEPENDENT_AMBULATORY_CARE_PROVIDER_SITE_OTHER): Payer: Medicare HMO | Admitting: "Endocrinology

## 2020-05-24 ENCOUNTER — Encounter: Payer: Self-pay | Admitting: "Endocrinology

## 2020-05-24 ENCOUNTER — Telehealth: Payer: Self-pay

## 2020-05-24 ENCOUNTER — Other Ambulatory Visit: Payer: Self-pay

## 2020-05-24 ENCOUNTER — Telehealth: Payer: Self-pay | Admitting: Physician Assistant

## 2020-05-24 VITALS — Ht 72.0 in | Wt 211.0 lb

## 2020-05-24 DIAGNOSIS — E782 Mixed hyperlipidemia: Secondary | ICD-10-CM | POA: Diagnosis not present

## 2020-05-24 DIAGNOSIS — E1159 Type 2 diabetes mellitus with other circulatory complications: Secondary | ICD-10-CM

## 2020-05-24 DIAGNOSIS — I10 Essential (primary) hypertension: Secondary | ICD-10-CM | POA: Diagnosis not present

## 2020-05-24 MED ORDER — APIXABAN 5 MG PO TABS: 5.0000 mg | ORAL_TABLET | Freq: Two times a day (BID) | ORAL | 3 refills | Status: DC

## 2020-05-24 NOTE — Progress Notes (Signed)
05/24/2020, 1:50 PM   Endocrinology follow-up note    Subjective:    Patient ID: John [name], male    DOB: 02-Sep-1945.  John [name] is here to follow-up for the management of his currently uncontrolled type 2 diabetes, hyperlipidemia.    PMD:  Erven Colla, DO.   Past Medical History:  Diagnosis Date  . Atrial flutter ([place]) 12/2010   Admitted with symptomatic bradycardia (HR 40s), atrial flutter with slow ventricular response 12/2010 + volume overload; AV nodal agents d/c'd and Pradaxa started; RFA in 01/2011  . CHF (congestive heart failure) ([place])   . Chronic combined systolic and diastolic heart failure ([place])    a. echo 01/06/11: mild LVH, EF 65-70%, mod to severe LAE, mild RVE, mild RAE, PASP 32;   TEE 10/12: EF 45-50% b. EF 45 to 50% by echo in 07/2018  . Class 2 severe obesity due to excess calories with serious comorbidity and body mass index (BMI) of 37.0 to 37.9 in adult ([place]) 07/17/2017  . Diabetes mellitus    non insulin dependant  . Hematoma, chest wall 05/15/2020  . Hyperlipidemia   . Hypertension   . Osteoarthritis   . 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 Presence of permanent cardiac pacemaker   . PSVT (paroxysmal supraventricular tachycardia) (H[facility])    Possibly atrial flutter  . S/P TAVR (transcatheter aortic valve replacement) 05/03/2020   s/p TAVR with a 29 mm Edwards S3U via the subclavian approach with Dr. Angelena Form & Dr. Cyndia Bent   . Severe aortic stenosis    Past Surgical History:  Procedure Laterality Date  . ATRIAL FLUTTER ABLATION N/A 02/07/2011   Procedure: ATRIAL FLUTTER ABLATION;  Surgeon: Evans Lance, MD;  Location: [facility] CATH LAB;  Service: Cardiovascular;  Laterality: N/A;  . CARDIAC ELECTROPHYSIOLOGY STUDY AND ABLATION  02/07/11  . CARDIOVERSION N/A 03/23/2016   Procedure: CARDIOVERSION;  Surgeon: Evans Lance, MD;  Location: [place];  Service:  Cardiovascular;  Laterality: N/A;  . COLONOSCOPY N/A 04/17/2017   Procedure: COLONOSCOPY;  Surgeon: Rogene Houston, MD;  [facility];  Service: Endoscopy;  Laterality: N/A;  830  . EP IMPLANTABLE DEVICE N/A 08/29/2015   Procedure: Pacemaker Implant;  Surgeon: Evans Lance, MD;  Location: [place] CV LAB;  Service: Cardiovascular;  Laterality: N/A;  . EP IMPLANTABLE DEVICE N/A 12/07/2015   Procedure: PPM Lead Revision/Repair;  Surgeon: Evans Lance, MD;  Location: [place] CV LAB;  Service: Cardiovascular;  Laterality: N/A;  . KNEE ARTHROSCOPY  2005   left  . LUMBAR SPINE SURGERY     "I've had 6 ORs 1972 thru 2004"  . POLYPECTOMY  04/17/2017   Procedure: POLYPECTOMY;  Surgeon: Rogene Houston, MD;  [facility];  Service: Endoscopy;;  transverse colon x3;  . RIGHT HEART CATH N/A 04/28/2020   Procedure: RIGHT HEART CATH;  Surgeon: Jolaine Artist, MD;  Location: [place] CV LAB;  Service: Cardiovascular;  Laterality: N/A;  . RIGHT/LEFT HEART CATH AND CORONARY ANGIOGRAPHY N/A 12/21/2019   Procedure: RIGHT/LEFT HEART CATH AND CORONARY ANGIOGRAPHY;  Surgeon: Nelva Bush, MD;  Location: [place] CV LAB;  Service: Cardiovascular;  Laterality: N/A;  . TEE WITHOUT CARDIOVERSION N/A 11/05/2019   Procedure: TRANSESOPHAGEAL ECHOCARDIOGRAM (TEE) WITH PROPOFOL;  Surgeon: Arnoldo Lenis, MD;  [facility];  Service: Endoscopy;  Laterality: N/A;  . TEE WITHOUT CARDIOVERSION N/A 05/03/2020   Procedure: TRANSESOPHAGEAL ECHOCARDIOGRAM (TEE);  Surgeon: Burnell Blanks, MD;  Location: [place];  Service: Open Heart Surgery;  Laterality: N/A;   Social History   Socioeconomic History  . Marital status: Married    Spouse name: Not on file  . Number of children: 2  . Years of education: Not on file  . Highest education level: Not on file  Occupational History  . Occupation: Immunologist: RETIRED  Tobacco Use  . Smoking status:  Former Smoker    Packs/day: 1.00    Years: 10.00    Pack years: 10.00    Types: Cigarettes    Quit date: 03/26/1986    Years since quitting: 34.1  . Smokeless tobacco: Never Used  . Tobacco comment: "stopped cigarette  smoking 1988"  Vaping Use  . Vaping Use: Never used  Substance and Sexual Activity  . Alcohol use: Not Currently    Alcohol/week: 0.0 standard drinks    Comment: "quit alcohol ~ 2007"  . Drug use: No  . Sexual activity: Yes    Partners: Female  Other Topics Concern  . Not on file  Social History Narrative  . Not on file   Social Determinants of Health   Financial Resource Strain: Not on file  Food Insecurity: Not on file  Transportation Needs: Not on file  Physical Activity: Not on file  Stress: Not on file  Social Connections: Not on file   Outpatient Encounter Medications as of 05/24/2020  Medication Sig  . ACCU-CHEK AVIVA PLUS test strip TEST BLOOD SUGAR TWICE DAILY BEFORE BREAKFAST AND AT BEDTIME (Patient taking differently: 1 each by Other route 2 (two) times daily.)  . acetaminophen (TYLENOL) 325 MG tablet Take 2 tablets (650 mg total) by mouth every 4 (four) hours as needed for headache or mild pain.  Marland Kitchen allopurinol (ZYLOPRIM) 300 MG tablet Take 0.5 tablets (150 mg total) by mouth daily.  Marland Kitchen apixaban (ELIQUIS) 5 MG TABS tablet Take 1 tablet (5 mg total) by mouth 2 (two) times daily.  Marland Kitchen aspirin 81 MG chewable tablet Chew 1 tablet (81 mg total) by mouth daily.  . benzonatate (TESSALON) 100 MG capsule Take 1 capsule (100 mg total) by mouth 3 (three) times daily as needed for cough.  . carvedilol (COREG) 3.125 MG tablet Take 1 tablet (3.125 mg total) by mouth 2 (two) times daily with a meal.  . colchicine 0.6 MG tablet Take 1 tablet (0.6 mg total) by mouth daily as needed.  . dapagliflozin propanediol (FARXIGA) 10 MG TABS tablet Take 1 tablet (10 mg total) by mouth daily before breakfast.  . diphenoxylate-atropine (LOMOTIL) 2.5-0.025 MG tablet Take 1 tablet by  mouth 4 (four) times daily as needed for diarrhea or loose stools.  Marland Kitchen HYDROcodone-acetaminophen (NORCO/VICODIN) 5-325 MG tablet Take one tablet po every 4 hrs prn pain (Patient taking differently: Take 1 tablet by mouth every 4 (four) hours as needed (for pain).)  . Insulin Pen Needle (BD PEN NEEDLE NANO U/F) 32G X 4 MM MISC 1 each by Does not apply route 4 (four) times daily. (Patient taking differently: 1 each by Does not apply route as directed.)  .  Multiple Vitamins-Minerals (ONE-A-DAY MENS 50+) TABS Take 1 tablet by mouth daily with breakfast.  . potassium chloride SA (KLOR-CON) 20 MEQ tablet Take 1.5 tablets (30 mEq total) by mouth 2 (two) times daily. (Patient taking differently: Take 40 mEq by mouth 2 (two) times daily.)  . spironolactone (ALDACTONE) 25 MG tablet Take 0.5 tablets (12.5 mg total) by mouth daily.  Marland Kitchen torsemide (DEMADEX) 20 MG tablet Take 2 tablets (40 mg total) by mouth 2 (two) times daily.  . TRULICITY 1.5 PY/0.9XI SOPN INJECT 1.5MG (1 PEN) SUBCUTANEOUSLY EVERY WEEK (Patient taking differently: Inject 1.5 mg into the skin every Sunday.)  . [DISCONTINUED] insulin degludec (TRESIBA FLEXTOUCH) 200 UNIT/ML FlexTouch Pen Inject 50 Units into the skin at bedtime. (Patient taking differently: Inject 20 Units into the skin at bedtime.)   No facility-administered encounter medications on file as of 05/24/2020.    ALLERGIES: No Known Allergies  VACCINATION STATUS: Immunization History  Administered Date(s) Administered  . Influenza,inj,Quad PF,6+ Mos 11/24/2014, 11/29/2015, 12/11/2016, 12/25/2017, 12/20/2018  . Influenza-Unspecified 11/24/2013, 12/25/2019  . Moderna Sars-Covid-2 Vaccination 05/03/2019, 06/03/2019, 01/23/2020  . Pneumococcal Conjugate-13 11/19/2013  . Pneumococcal Polysaccharide-23 11/29/2015  . Pneumococcal-Unspecified 03/24/2009  . Td 06/23/2010    Diabetes He presents for his follow-up (Telephone visit due to coronavirus pandemic) diabetic visit. He has type  2 diabetes mellitus. Onset time: He was diagnosed at approximate age of 69 years. His disease course has been improving. There are no hypoglycemic associated symptoms. Pertinent negatives for hypoglycemia include no confusion, pallor or seizures. There are no diabetic associated symptoms. Pertinent negatives for diabetes include no fatigue, no polydipsia, no polyphagia, no polyuria and no weakness. There are no hypoglycemic complications. Symptoms are improving. Diabetic complications include heart disease and nephropathy. Risk factors for coronary artery disease include diabetes mellitus, dyslipidemia, family history, obesity, male sex, hypertension, sedentary lifestyle and tobacco exposure. Current diabetic treatment includes insulin injections. His weight is increasing steadily. He is following a generally unhealthy diet. When asked about meal planning, he reported none. He has not had a previous visit with a dietitian. He never participates in exercise. His home blood glucose trend is decreasing steadily. His breakfast blood glucose range is generally 90-110 mg/dl. (He was engaged in telehealth for follow-up of diabetes care.  His recent A1c of 5.7%, generally improving from 8.3%.  He underwent cardiac surgery for aortic stenosis.  His [place] Aas was lowered to 20 units nightly, was put on Farxiga 10 mg p.o. daily along with Trulicity 1.5 mg subcutaneously weekly.  He reports hypoglycemia, below 70 mg per DL several times a week.  ) An ACE inhibitor/angiotensin II receptor blocker is being taken. He does not see a podiatrist.Eye exam is current.  Hyperlipidemia This is a chronic problem. The current episode started more than 1 year ago. The problem is controlled. Exacerbating diseases include diabetes and obesity. Pertinent negatives include no myalgias. Current antihyperlipidemic treatment includes statins. Risk factors for coronary artery disease include diabetes mellitus, dyslipidemia, family history, obesity,  male sex, hypertension and a sedentary lifestyle.     Review of systems  Constitutional: + He lost 30+ pounds.  He is recovering from cardiac surgery for aortic stenosis.   Current  Body mass index is 28.62 kg/m. Marland Kitchen    Objective:    Vitals with BMI 05/24/2020 05/15/2020 05/15/2020  Height 6' 0"  - -  Weight 211 lbs - -  BMI 33.82 - -  Systolic - 505 397  Diastolic - 58 60  Pulse - 60 63  Physical Exam- Limited    CMP     Component Value Date/Time   NA 133 (L) 05/14/2020 0340   NA 136 04/04/2020 0934   K 3.5 05/14/2020 0340   CL 96 (L) 05/14/2020 0340   CO2 26 05/14/2020 0340   GLUCOSE 240 (H) 05/14/2020 0340   BUN 47 (H) 05/14/2020 0340   BUN 29 (H) 04/04/2020 0934   CREATININE 1.26 (H) 05/14/2020 0340   CREATININE 1.32 (H) 03/07/2016 1043   CALCIUM 8.4 (L) 05/14/2020 0340   PROT 6.0 (L) 05/03/2020 0305   PROT 8.2 12/02/2019 1215   ALBUMIN 2.3 (L) 05/03/2020 0305   ALBUMIN 4.1 12/02/2019 1215   AST 28 05/03/2020 0305   ALT 15 05/03/2020 0305   ALKPHOS 118 05/03/2020 0305   BILITOT 4.1 (H) 05/03/2020 0305   BILITOT 3.2 (H) 12/02/2019 1215   GFRNONAA 60 (L) 05/14/2020 0340   GFRAA 52 (L) 04/04/2020 0934     Diabetic Labs (most recent): Lab Results  Component Value Date   HGBA1C 5.7 (H) 05/03/2020   HGBA1C 6.8 (H) 03/09/2020   HGBA1C 6.9 02/25/2020    Lipid Panel     Component Value Date/Time   CHOL 178 08/12/2019 0809   CHOL 142 01/17/2018 0835   TRIG 100 08/12/2019 0809   HDL 51 08/12/2019 0809   HDL 53 01/17/2018 0835   CHOLHDL 3.5 08/12/2019 0809   VLDL 20 08/12/2019 0809   LDLCALC 107 (H) 08/12/2019 0809   LDLCALC 71 01/17/2018 0835     Lab Results  Component Value Date   TSH 1.940 07/10/2017   TSH 1.563 08/25/2015   TSH 1.206 01/05/2011   FREET4 1.12 07/10/2017        Assessment & Plan:   1. DM type 2 causing vascular disease ([place])  - John [name] has currently uncontrolled symptomatic type 2 DM since 75 years of age.  He  missed his appointment since June 2021.  He was engaged in telehealth for follow-up of diabetes care.  His recent A1c of 5.7%, generally improving from 8.3%.  He underwent cardiac surgery for aortic stenosis.  His Tyler Aas was lowered to 20 units nightly, was put on Farxiga 10 mg p.o. daily along with Trulicity 1.5 mg subcutaneously weekly.  He reports hypoglycemia, below 70 mg per DL several times a week.     -He does not report any major hypoglycemia. Recent labs reviewed.  -his diabetes is complicated by obesity/sedentary life , CKD, CHF , congestive heart failure/bradyarrhythmia and John [name] remains at a high risk for more acute and chronic complications which include CAD, CVA, CKD, retinopathy, and neuropathy. These are all discussed in detail with the patient.  - I encouraged him to switch to  unprocessed or minimally processed complex starch and increased protein intake (animal or plant source), fruits, and vegetables.  - Suggestion is made for him to avoid simple carbohydrates  from his diet including Cakes, Sweet Desserts, Ice Cream, Soda (diet and regular), Sweet Tea, Candies, Chips, Cookies, Store Bought Juices, Alcohol in Excess of  1-2 drinks a day, Artificial Sweeteners,  Coffee Creamer, and "Sugar-free" Products, Lemonade. This will help patient to have more stable blood glucose profile and potentially avoid unintended weight gain.   - he is advised to stick to a routine mealtimes to eat 3 meals  a day and avoid unnecessary snacks ( to snack only to correct hypoglycemia).    - I have approached him with the following individualized plan to manage  diabetes and patient agrees:   -Based on his report, his developing hypoglycemia from inadvertent use of insulin and other medications.  He is advised to discontinue his [place] at this time.  He may benefit from [place] from cardiac point of view, advised to continue 10 mg p.o. daily at breakfast.  His Metformin was discontinued.  He  is advised to continue Trulicity 1.5 mg subcutaneously. He is encouraged to monitor blood glucose twice a day-daily before breakfast and at bedtime.  -Patient is encouraged to call clinic for blood glucose levels less than 70 or above 200 mg /dl.   2) Lipids/HPL:   His recent lipid panel showed controlled LDL at 71.  He will continue to benefit from statin treatment, advised to continue Crestor 20 mg p.o. nightly.     3) hypertension- he is advised to home monitor blood pressure and report if > 140/90 on 2 separate readings.  -He is advised to continue on his torsemide as needed, carvedilol 25 mg p.o. twice daily.  -He has mild hypokalemia at 3.3, currently on potassium supplement.  He plans to see his PMD in 2-3 weeks.  4) weight management: His BMI is 28.6 , significant weight loss recently.  He is advised to continue follow-up with his cardiologist.  - I advised patient to maintain close follow up with Erven Colla, DO for primary care needs. - Time spent on this patient care encounter:  40 min, of which > 50% was spent in  counseling and the rest reviewing his blood glucose logs , discussing his hypoglycemia and hyperglycemia episodes, reviewing his current and  previous labs / studies  ( including abstraction from other facilities) and medications  doses and developing a  long term treatment plan and documenting his care.   Please refer to Patient Instructions for Blood Glucose Monitoring and Insulin/Medications Dosing Guide"  in media tab for additional information. Please  also refer to " Patient Self Inventory" in the Media  tab for reviewed elements of pertinent patient history.  John [name] participated in the discussions, expressed understanding, and voiced agreement with the above plans.  All questions were answered to his satisfaction. he is encouraged to contact clinic should he have any questions or concerns prior to his return visit.    Follow up plan: - Return in  about 3 months (around 08/24/2020) for Bring Meter and Logs- A1c in Office.  Glade Lloyd, MD [facility] [address] [place], [place] [address] Phone: [phone]  Fax: [phone]    05/24/2020, 1:50 PM  This note was partially dictated with voice recognition software. Similar sounding words can be transcribed inadequately or may not  be corrected upon review.

## 2020-05-24 NOTE — Patient Instructions (Signed)

## 2020-05-24 NOTE — Telephone Encounter (Signed)
  [facility]   MULTIDISCIPLINARY HEART VALVE TEAM   Pt was scheduled for echo and 1 month TAVR eval with me on 3/16. Due to physician schedules his BiV upgrade was r/s from this week to 3/16. There is no echo availability in our system until 3/23. He has a previously scheduled apt with Dr. Haroldine Laws on 3/7 which can count as his 1 month TAVR eval and we will repeat an echo in the hospital when he comes in for his BiV ICD placement.   Angelena Form PA-C  [facility]

## 2020-05-24 NOTE — Telephone Encounter (Signed)
Refilled eliquis to [facility]

## 2020-05-24 NOTE — Telephone Encounter (Signed)
Pt rescheduled for BIV PPM upgrade to 06/08/20.  Covid test rescheduled.  Will plan to get ECHO during hospitalization.  Instruction letter updated and mailed/mychart  Work up complete.

## 2020-05-25 ENCOUNTER — Other Ambulatory Visit (HOSPITAL_COMMUNITY): Payer: Medicare HMO

## 2020-05-26 DIAGNOSIS — Z950 Presence of cardiac pacemaker: Secondary | ICD-10-CM | POA: Diagnosis not present

## 2020-05-26 DIAGNOSIS — I5042 Chronic combined systolic (congestive) and diastolic (congestive) heart failure: Secondary | ICD-10-CM | POA: Diagnosis not present

## 2020-05-26 DIAGNOSIS — E119 Type 2 diabetes mellitus without complications: Secondary | ICD-10-CM | POA: Diagnosis not present

## 2020-05-26 DIAGNOSIS — S81812D Laceration without foreign body, left lower leg, subsequent encounter: Secondary | ICD-10-CM | POA: Diagnosis not present

## 2020-05-26 DIAGNOSIS — I110 Hypertensive heart disease with heart failure: Secondary | ICD-10-CM | POA: Diagnosis not present

## 2020-05-26 DIAGNOSIS — Z952 Presence of prosthetic heart valve: Secondary | ICD-10-CM | POA: Diagnosis not present

## 2020-05-26 DIAGNOSIS — M109 Gout, unspecified: Secondary | ICD-10-CM | POA: Diagnosis not present

## 2020-05-26 DIAGNOSIS — M6281 Muscle weakness (generalized): Secondary | ICD-10-CM | POA: Diagnosis not present

## 2020-05-26 DIAGNOSIS — Z48812 Encounter for surgical aftercare following surgery on the circulatory system: Secondary | ICD-10-CM | POA: Diagnosis not present

## 2020-05-27 DIAGNOSIS — I5042 Chronic combined systolic (congestive) and diastolic (congestive) heart failure: Secondary | ICD-10-CM | POA: Diagnosis not present

## 2020-05-27 DIAGNOSIS — S81812D Laceration without foreign body, left lower leg, subsequent encounter: Secondary | ICD-10-CM | POA: Diagnosis not present

## 2020-05-27 DIAGNOSIS — I110 Hypertensive heart disease with heart failure: Secondary | ICD-10-CM | POA: Diagnosis not present

## 2020-05-27 DIAGNOSIS — Z48812 Encounter for surgical aftercare following surgery on the circulatory system: Secondary | ICD-10-CM | POA: Diagnosis not present

## 2020-05-27 DIAGNOSIS — Z952 Presence of prosthetic heart valve: Secondary | ICD-10-CM | POA: Diagnosis not present

## 2020-05-27 DIAGNOSIS — M109 Gout, unspecified: Secondary | ICD-10-CM | POA: Diagnosis not present

## 2020-05-27 DIAGNOSIS — M6281 Muscle weakness (generalized): Secondary | ICD-10-CM | POA: Diagnosis not present

## 2020-05-27 DIAGNOSIS — E119 Type 2 diabetes mellitus without complications: Secondary | ICD-10-CM | POA: Diagnosis not present

## 2020-05-27 DIAGNOSIS — Z950 Presence of cardiac pacemaker: Secondary | ICD-10-CM | POA: Diagnosis not present

## 2020-05-27 NOTE — Progress Notes (Signed)
Error

## 2020-05-30 ENCOUNTER — Other Ambulatory Visit: Payer: Self-pay

## 2020-05-30 ENCOUNTER — Ambulatory Visit (HOSPITAL_COMMUNITY)
Admission: RE | Admit: 2020-05-30 | Discharge: 2020-05-30 | Disposition: A | Discharge status: Home / Self Care | Payer: Medicare HMO | Source: Ambulatory Visit | Attending: Family Medicine | Admitting: Family Medicine

## 2020-05-30 VITALS — BP 128/70 | HR 82 | Wt 217.6 lb

## 2020-05-30 DIAGNOSIS — I4892 Unspecified atrial flutter: Secondary | ICD-10-CM | POA: Insufficient documentation

## 2020-05-30 DIAGNOSIS — I081 Rheumatic disorders of both mitral and tricuspid valves: Secondary | ICD-10-CM | POA: Insufficient documentation

## 2020-05-30 DIAGNOSIS — Z952 Presence of prosthetic heart valve: Secondary | ICD-10-CM | POA: Insufficient documentation

## 2020-05-30 DIAGNOSIS — I5042 Chronic combined systolic (congestive) and diastolic (congestive) heart failure: Secondary | ICD-10-CM | POA: Insufficient documentation

## 2020-05-30 DIAGNOSIS — Z79899 Other long term (current) drug therapy: Secondary | ICD-10-CM | POA: Diagnosis not present

## 2020-05-30 DIAGNOSIS — I5022 Chronic systolic (congestive) heart failure: Secondary | ICD-10-CM | POA: Diagnosis not present

## 2020-05-30 DIAGNOSIS — Z87891 Personal history of nicotine dependence: Secondary | ICD-10-CM | POA: Diagnosis not present

## 2020-05-30 DIAGNOSIS — Z794 Long term (current) use of insulin: Secondary | ICD-10-CM | POA: Insufficient documentation

## 2020-05-30 DIAGNOSIS — N1832 Chronic kidney disease, stage 3b: Secondary | ICD-10-CM | POA: Insufficient documentation

## 2020-05-30 DIAGNOSIS — N183 Chronic kidney disease, stage 3 unspecified: Secondary | ICD-10-CM

## 2020-05-30 DIAGNOSIS — I447 Left bundle-branch block, unspecified: Secondary | ICD-10-CM | POA: Diagnosis not present

## 2020-05-30 DIAGNOSIS — Z7901 Long term (current) use of anticoagulants: Secondary | ICD-10-CM | POA: Insufficient documentation

## 2020-05-30 DIAGNOSIS — I428 Other cardiomyopathies: Secondary | ICD-10-CM | POA: Diagnosis not present

## 2020-05-30 DIAGNOSIS — E785 Hyperlipidemia, unspecified: Secondary | ICD-10-CM | POA: Insufficient documentation

## 2020-05-30 DIAGNOSIS — Z7982 Long term (current) use of aspirin: Secondary | ICD-10-CM | POA: Diagnosis not present

## 2020-05-30 DIAGNOSIS — E1122 Type 2 diabetes mellitus with diabetic chronic kidney disease: Secondary | ICD-10-CM | POA: Diagnosis not present

## 2020-05-30 DIAGNOSIS — Z950 Presence of cardiac pacemaker: Secondary | ICD-10-CM | POA: Insufficient documentation

## 2020-05-30 DIAGNOSIS — I482 Chronic atrial fibrillation, unspecified: Secondary | ICD-10-CM | POA: Diagnosis not present

## 2020-05-30 DIAGNOSIS — Z6837 Body mass index (BMI) 37.0-37.9, adult: Secondary | ICD-10-CM | POA: Diagnosis not present

## 2020-05-30 DIAGNOSIS — I130 Hypertensive heart and chronic kidney disease with heart failure and stage 1 through stage 4 chronic kidney disease, or unspecified chronic kidney disease: Secondary | ICD-10-CM | POA: Diagnosis not present

## 2020-05-30 DIAGNOSIS — I4821 Permanent atrial fibrillation: Secondary | ICD-10-CM

## 2020-05-30 LAB — BASIC METABOLIC PANEL
Anion gap: 12 (ref 5–15)
BUN: 28 mg/dL — ABNORMAL HIGH (ref 8–23)
CO2: 24 mmol/L (ref 22–32)
Calcium: 9 mg/dL (ref 8.9–10.3)
Chloride: 96 mmol/L — ABNORMAL LOW (ref 98–111)
Creatinine, Ser: 1.43 mg/dL — ABNORMAL HIGH (ref 0.61–1.24)
GFR, Estimated: 51 mL/min — ABNORMAL LOW (ref >60)
Glucose, Bld: 248 mg/dL — ABNORMAL HIGH (ref 70–99)
Potassium: 5 mmol/L (ref 3.5–5.1)
Sodium: 132 mmol/L — ABNORMAL LOW (ref 135–145)

## 2020-05-30 LAB — CBC
HCT: 35.4 % — ABNORMAL LOW (ref 39.0–52.0)
Hemoglobin: 11.8 g/dL — ABNORMAL LOW (ref 13.0–17.0)
MCH: 30.1 pg (ref 26.0–34.0)
MCHC: 33.3 g/dL (ref 30.0–36.0)
MCV: 90.3 fL (ref 80.0–100.0)
Platelets: 124 10*3/uL — ABNORMAL LOW (ref 150–400)
RBC: 3.92 MIL/uL — ABNORMAL LOW (ref 4.22–5.81)
RDW: 17.4 % — ABNORMAL HIGH (ref 11.5–15.5)
WBC: 5.5 10*3/uL (ref 4.0–10.5)
nRBC: 0 % (ref 0.0–0.2)

## 2020-05-30 NOTE — Patient Instructions (Addendum)
Labs done today. We will contact you only if your labs are abnormal.  No medication changes were made. Please continue all current medications as prescribed.  Your physician recommends that you schedule a follow-up appointment in: 4-5 weeks with Dr. Haroldine Laws   If you have any questions or concerns before your next appointment please send [place] a message through [facility] or call our office at [phone].    TO LEAVE A MESSAGE FOR THE NURSE SELECT OPTION 2, PLEASE LEAVE A MESSAGE INCLUDING: . YOUR NAME . DATE OF BIRTH . CALL BACK NUMBER . REASON FOR CALL**this is important as we prioritize the call backs  YOU WILL RECEIVE A CALL BACK THE SAME DAY AS LONG AS YOU CALL BEFORE 4:00 PM   Do the following things EVERYDAY: 1) Weigh yourself in the morning before breakfast. Write it down and keep it in a log. 2) Take your medicines as prescribed 3) Eat low salt foods--Limit salt (sodium) to 2000 mg per day.  4) Stay as active as you can everyday 5) Limit all fluids for the day to less than 2 liters   At the [facility], you and your health needs are our priority. As part of our continuing mission to provide you with exceptional heart care, we have created designated Provider Care Teams. These Care Teams include your primary Cardiologist (physician) and Advanced Practice Providers (APPs- Physician Assistants and Nurse Practitioners) who all work together to provide you with the care you need, when you need it.   You may see any of the following providers on your designated Care Team at your next follow up: Marland Kitchen Dr Glori Bickers . Dr Loralie Champagne . Darrick Grinder, NP . Lyda Jester, PA . Audry Riles, PharmD   Please be sure to bring in all your medications bottles to every appointment. '

## 2020-05-30 NOTE — Progress Notes (Signed)
ADVANCED HF CLINIC CONSULT NOTE  Referring Physician: Levell July NP, Cardiology  Primary Care: Erven Colla, DO Primary Cardiologist: Dr. Harl Bowie  EP: Dr. Lovena Le HF Cardiologist: Dr. Haroldine Laws Reason for Referral: [facility] Heart Failure Visit   HPI: 75 y/o male w/ chronic combined systolic and diastolic HF 2/2 NICM, recent [facility] 9/21 showed no significant coronary disease. Most recent 2D echo 7/21 showed LVEF 30-35%. RV mildly reduced. Also concern for moderate to severe MR. AV also noted to be severely calcified. LVOT/AV VTI ratio: 0.23 by TTE. This was followed by TEE 8/21 which showed only moderate MR and moderate TR. AV was noted to be severely thickened/heavily calcified but no stenosis severity rating.  Also w/ h/o atrial flutter and stage III CKD (baseline SCr ~2), T2DM, HTN and HLD.   Had recent [place] 9/21 that showed moderately elevated filling pressures and moderately reduced CO. CI was 1.8. Severity of aortic stenosis also assessed by cath. Findings c/w moderate aortic stenosis (mean gradient 21 mmHg, AVA 1.3 cm^2).  Admitted 12/21 for acute hypoxic respiratory failure 2/2 PNA and CHF. Also w/ symptomatic  cholelithiasis. He was diuresed w/ IV lasix but then developed AKI, felt over diuresed. SCr bump to 2.6 (baseline 1.5-1.8). PO diuretics ultimately restarted but at lower dose. Torsemide reduced from 40 mg bid to once daily. Spironolactone and metolazone also discontinued. His discharge wt was 227 lb.    On 03/20/21 he went to the ED with increased dyspnea and leg edema. Home diuretic regimen increased and he was discharged home.   AHF OV on 03/29/20 markedly fluid overloaded. Referred to [facility] for IV Lasix. Recommend 80 mg IV Lasix BID + 2.5 mg of metolazone daily x 3 days. Coreg stopped. Referred to EP for evaluation of upgrade to BiV pacer, referred to Structural Heart Team for TAVR consideration, & unna boots ordered.  Admitted 04/25/20 from cath with marked volume  overload and low output HF. Started on dobutamine and diuresis. Hospital course complicated by spontaneous chest wall hematoma. Hematoma extended to left flank. Once stabilized underwent TAVR on 05/03/20. Dobutamine gradually weaned off. Placed on torsemide 40 mg twice a day. EP plan to place BiV after d/c. Discharge weight 211 pounds.   Today he returns for post hospital HF follow up with his wife. Overall feels a little better. Occasionally short of breath walking in the house. Rarely short of breath at night. Denies Orthopnea. Able to walk in the house without difficulty.  Appetite fair. Eating breakfast but not much else. No bleeding issues. No fever or chills. Weight at home 207-209 pounds. Has had some swelling around his ankles but says this improves with elevation and his compression socks. Taking all medications. Followed by [facility].   Cardiac Testing  ECHO 05/04/20 Post TAVR EF 30-35% RV not well visualized. No Aortic Valve Regurgitation. Moderate thickening MV.   2D Echo 7/21 1. Left ventricular ejection fraction, by estimation, is 30 to 35%. The  left ventricle has moderately decreased function. The left ventricle  demonstrates global hypokinesis with abnormal septal motion possibly  consistent with pacing. The left  ventricular internal cavity size was mildly dilated. Left ventricular  diastolic parameters are indeterminate.  2. Right ventricular systolic function is mildly reduced. The right  ventricular size is normal. There is mildly elevated pulmonary artery  systolic pressure. The estimated right ventricular systolic pressure is  01.6 mmHg.  3. Left atrial size was moderately dilated.  4. Right atrial size was moderately dilated.  5. The mitral valve is abnormal, mildly thickened and calcified. Moderate  to severe mitral valve regurgitation, eccentric and posteriorly directed.  Not clear that PISA calculations best define degree of regurgitation.  6. The aortic valve has an  indeterminant number of cusps and is severely  calcified. Aortic valve mean gradient measures 15.5 mmHg. Cannot exclude  severe low gradient aortic stenosis with dimentionless index of 0.23.  7. The inferior vena cava is dilated in size with <50% respiratory  variability, suggesting right atrial pressure of 15 mmHg.    TEE 8/21 1. Left ventricular ejection fraction, by estimation, is 35 to 40%. The  left ventricle has moderately decreased function. The left ventricle  demonstrates global hypokinesis.  2. Right ventricular systolic function is mildly reduced. The right  ventricular size is mildly enlarged.  3. Left atrial size was moderately dilated. No left atrial/left atrial  appendage thrombus was detected. The LAA emptying velocity was 30 cm/s.  4. Right atrial size was moderately dilated.  5. The mitral valve is abnormal. Moderate mitral valve regurgitation. No  evidence of mitral stenosis.  6. Tricuspid valve regurgitation is moderate.  7. The aortic valve is abnormal. Aortic valve regurgitation is not visualized.   RHC 04/2020 RA = 12 RV = 51/11 PA = 52/15 (29) PCW = 29 ( v = 44) Fick cardiac output/index = 4.5/2.0 Thermo CO/CI = 5.2/2.3 PVR = < 1.0 WU Ao sat = 110% PA sat = 64%, 66% Assessment: 1. Markedly elevated left sided filling pressures with prominent v-waves in PCWP tracing 2. Moderate reduced cardiac index  R/LHC 11/2019 Conclusions: 1. No angiographically significant coronary artery disease. 2. Mildly to moderately elevated left heart filling pressures (PCWP 22 mmHg, LVEDP 25-30 mmHg).  Prominent V waves noted on PCWP tracing consistent with significant mitral regurgitation. 3. Severely elevated right heart filling pressures (mean RA and RVEDP 16 mmHg). 4. Moderately reduced Fick cardiac output/index. 5. Moderate aortic stenosis (mean gradient 21 mmHg, AVA 1.3 cm^2).  Recommendations: 1. Continue optimization of evidence-based heart failure as well  as further diuresis. 2. Ongoing management of non-ischemic cardiomyopathy and valvular heart disease per Dr. Harl Bowie. 3. Primary prevention of coronary artery disease.  ROS: All systems reviewed and negative except as per HPI.    Past Medical History:  Diagnosis Date  . Atrial flutter ([employer]) 12/2010   Admitted with symptomatic bradycardia (HR 40s), atrial flutter with slow ventricular response 12/2010 + volume overload; AV nodal agents d/c'd and Pradaxa started; RFA in 01/2011  . CHF (congestive heart failure) ([place])   . Chronic combined systolic and diastolic heart failure ([place])    a. echo 01/06/11: mild LVH, EF 65-70%, mod to severe LAE, mild RVE, mild RAE, PASP 32;   TEE 10/12: EF 45-50% b. EF 45 to 50% by echo in 07/2018  . Class 2 severe obesity due to excess calories with serious comorbidity and body mass index (BMI) of 37.0 to 37.9 in adult ([place]) 07/17/2017  . Diabetes mellitus    non insulin dependant  . Hematoma, chest wall 05/15/2020  . Hyperlipidemia   . Hypertension   . Osteoarthritis   . 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 Presence of permanent cardiac pacemaker   . PSVT (paroxysmal supraventricular tachycardia) (H[facility])    Possibly atrial flutter  . S/P TAVR (transcatheter aortic valve replacement) 05/03/2020   s/p TAVR with a 29 mm [employer] S3U via the subclavian approach with Dr. Angelena Form & Dr. Cyndia Bent   . Severe aortic stenosis  Current Outpatient Medications  Medication Sig Dispense Refill  . acetaminophen (TYLENOL) 325 MG tablet Take 2 tablets (650 mg total) by mouth every 4 (four) hours as needed for headache or mild pain.    Marland Kitchen allopurinol (ZYLOPRIM) 300 MG tablet Take 0.5 tablets (150 mg total) by mouth daily. 30 tablet 6  . apixaban (ELIQUIS) 5 MG TABS tablet Take 1 tablet (5 mg total) by mouth 2 (two) times daily. 180 tablet 3  . aspirin 81 MG chewable tablet Chew 1 tablet (81 mg total) by mouth daily.    . benzonatate (TESSALON) 100 MG capsule Take 1 capsule (100 mg  total) by mouth 3 (three) times daily as needed for cough. 90 capsule 3  . carvedilol (COREG) 3.125 MG tablet Take 1 tablet (3.125 mg total) by mouth 2 (two) times daily with a meal. 60 tablet 6  . colchicine 0.6 MG tablet Take 1 tablet (0.6 mg total) by mouth daily as needed. (Patient taking differently: Take 0.6 mg by mouth daily as needed (gout).) 30 tablet 6  . dapagliflozin propanediol (FARXIGA) 10 MG TABS tablet Take 1 tablet (10 mg total) by mouth daily before breakfast. 30 tablet 3  . diphenoxylate-atropine (LOMOTIL) 2.5-0.025 MG tablet Take 1 tablet by mouth 4 (four) times daily as needed for diarrhea or loose stools.    Marland Kitchen HYDROcodone-acetaminophen (NORCO/VICODIN) 5-325 MG tablet Take one tablet po every 4 hrs prn pain (Patient taking differently: Take 1 tablet by mouth every 4 (four) hours as needed (for pain).) 28 tablet 0  . Insulin Pen Needle (BD PEN NEEDLE NANO U/F) 32G X 4 MM MISC 1 each by Does not apply route 4 (four) times daily. (Patient taking differently: 1 each by Does not apply route as directed.) 150 each 5  . Multiple Vitamins-Minerals (ONE-A-DAY MENS 50+) TABS Take 1 tablet by mouth daily with breakfast.    . potassium chloride SA (KLOR-CON) 20 MEQ tablet Take 1.5 tablets (30 mEq total) by mouth 2 (two) times daily. 90 tablet 6  . spironolactone (ALDACTONE) 25 MG tablet Take 0.5 tablets (12.5 mg total) by mouth daily. 15 tablet 6  . torsemide (DEMADEX) 20 MG tablet Take 2 tablets (40 mg total) by mouth 2 (two) times daily.    . TRULICITY 1.5 AT/5.5DD SOPN INJECT 1.5MG (1 PEN) SUBCUTANEOUSLY EVERY WEEK (Patient taking differently: Inject 1.5 mg into the skin every Sunday.) 2 mL 2  . ACCU-CHEK AVIVA PLUS test strip TEST BLOOD SUGAR TWICE DAILY BEFORE BREAKFAST AND AT BEDTIME (Patient taking differently: 1 each by Other route 2 (two) times daily.) 200 strip 1   No current facility-administered medications for this encounter.    No Known Allergies    Social History    Socioeconomic History  . Marital status: Married    Spouse name: Not on file  . Number of children: 2  . Years of education: Not on file  . Highest education level: Not on file  Occupational History  . Occupation: Immunologist: RETIRED  Tobacco Use  . Smoking status: Former Smoker    Packs/day: 1.00    Years: 10.00    Pack years: 10.00    Types: Cigarettes    Quit date: 03/26/1986    Years since quitting: 34.2  . Smokeless tobacco: Never Used  . Tobacco comment: "stopped cigarette  smoking 1988"  Vaping Use  . Vaping Use: Never used  Substance and Sexual Activity  . Alcohol use: Not Currently    Alcohol/week:  0.0 standard drinks    Comment: "quit alcohol ~ 2007"  . Drug use: No  . Sexual activity: Yes    Partners: Female  Other Topics Concern  . Not on file  Social History Narrative  . Not on file   Social Determinants of Health   Financial Resource Strain: Not on file  Food Insecurity: Not on file  Transportation Needs: Not on file  Physical Activity: Not on file  Stress: Not on file  Social Connections: Not on file  Intimate Partner Violence: Not on file      Family History  Problem Relation Age of Onset  . Heart attack Mother   . Hypertension Mother   . Heart attack Father   . Hypertension Father   . Heart attack Brother   . Colon cancer Neg Hx    Wt Readings from Last 3 Encounters:  05/30/20 98.7 kg  05/24/20 95.7 kg  05/15/20 99.1 kg    Vitals:   05/30/20 1152  BP: 128/70  Pulse: 82  SpO2: 97%  Weight: 98.7 kg    PHYSICAL EXAM: General:  Arrived in a wheel chair. No resp difficulty HEENT: normal Neck: supple. JVP 6-7. Carotids 2+ bilat; no bruits. No lymphadenopathy or thryomegaly appreciated. Cor: PMI nondisplaced. Irregular rate & rhythm. No rubs, gallops or murmurs. Lungs: clear. Abdomen: soft, nontender, nondistended. No hepatosplenomegaly. No bruits or masses. Good bowel sounds. Extremities: no cyanosis,  clubbing, rash, R and LLE trace-1+ edema Neuro: alert & orientedx3, cranial nerves grossly intact. moves all 4 extremities w/o difficulty. Affect pleasant Skin: L flank ecchymotic with fluctuance noted (had hematoma after cath 04/2020).  EKG: V paced 64 bpm QRS 114m    ASSESSMENT & PLAN:  1. Chronic Combined Systolic and Diastolic Heart Failure/NICM: - Echo 08/2015 EF 50-55% (PPM placed in 08/2015)  - Echo 10/2015 EF dropped to 40-45% - Echo 2018 EF 45% - Echo 2021 EF 30-35%, RV mildly reduced  -[place] showed no coronary disease.  -Post TAVR 05/03/20. .Marland KitchenPlan for BiV 06/08/20 by Dr TLovena Le  - NYHA III.  Volume status stable. Continue torsemide 40 mg daily + 30 mEq of KCl bid. Continue compression stockings.  - Continue  Farxiga 10 mg daily. - Continue coreg 3.125 mg twice a day.  - Continue spiro 12.5 mg daily  - Check BMET today.   2. Mitral Regurgitation  - Moderate by TEE 8/21.  - Likely functional from dilated CM.  -Severe by TTE 04/26/20 and consistent with severe functional MR - Improved significantly after TAVR now mild-moderate  3. Aortic Stenosis  -S/P TAVR 05/03/2020  - [facility] 9/21 showed moderate AS. mean gradient 21 mmHg, AVA 1.3 cm^2  4. Atrial Flutter/ Chronic Atrial Fibrillation - s/p AFL ablation.  - In chronic Afib, rate controlled.  - Continue Eliquis.  - No bb for now.   5. Stage IIIb CKD - Followed by nephrology.  - Baseline SCr ~1.5-1.8, Check BMET today.  - Continue farxiga.    6. HTN -Stable.   7. T2DM - On insulin. a1C 6.8 (12/21) - Followed by PCP.   8. H/o CHB  - s/p PPM followed by Dr. TLovena Le -  EF dropped after device placed. Plan for upgrade next week.   9. Chest Wall Hematoma  During recent hospitalization. Check CBC today.   Plan for Biv 06/08/20. Hopefully he have some EF recovery with this.  Follow up in 4 weeks with Dr BHaroldine Laws Discussed plan for with him.  No change.   Darrick Grinder, NP  05/30/20 11:59 AM

## 2020-05-31 DIAGNOSIS — I5043 Acute on chronic combined systolic (congestive) and diastolic (congestive) heart failure: Secondary | ICD-10-CM | POA: Diagnosis not present

## 2020-05-31 DIAGNOSIS — E1122 Type 2 diabetes mellitus with diabetic chronic kidney disease: Secondary | ICD-10-CM | POA: Diagnosis not present

## 2020-05-31 DIAGNOSIS — Z48812 Encounter for surgical aftercare following surgery on the circulatory system: Secondary | ICD-10-CM | POA: Diagnosis not present

## 2020-05-31 DIAGNOSIS — M109 Gout, unspecified: Secondary | ICD-10-CM | POA: Diagnosis not present

## 2020-05-31 DIAGNOSIS — M1030 Gout due to renal impairment, unspecified site: Secondary | ICD-10-CM | POA: Diagnosis not present

## 2020-05-31 DIAGNOSIS — I4821 Permanent atrial fibrillation: Secondary | ICD-10-CM | POA: Diagnosis not present

## 2020-05-31 DIAGNOSIS — E119 Type 2 diabetes mellitus without complications: Secondary | ICD-10-CM | POA: Diagnosis not present

## 2020-05-31 DIAGNOSIS — I130 Hypertensive heart and chronic kidney disease with heart failure and stage 1 through stage 4 chronic kidney disease, or unspecified chronic kidney disease: Secondary | ICD-10-CM | POA: Diagnosis not present

## 2020-05-31 DIAGNOSIS — Z952 Presence of prosthetic heart valve: Secondary | ICD-10-CM | POA: Diagnosis not present

## 2020-05-31 DIAGNOSIS — I081 Rheumatic disorders of both mitral and tricuspid valves: Secondary | ICD-10-CM | POA: Diagnosis not present

## 2020-05-31 DIAGNOSIS — J849 Interstitial pulmonary disease, unspecified: Secondary | ICD-10-CM | POA: Diagnosis not present

## 2020-05-31 DIAGNOSIS — N184 Chronic kidney disease, stage 4 (severe): Secondary | ICD-10-CM | POA: Diagnosis not present

## 2020-05-31 DIAGNOSIS — Z950 Presence of cardiac pacemaker: Secondary | ICD-10-CM | POA: Diagnosis not present

## 2020-05-31 DIAGNOSIS — M6281 Muscle weakness (generalized): Secondary | ICD-10-CM | POA: Diagnosis not present

## 2020-05-31 DIAGNOSIS — I110 Hypertensive heart disease with heart failure: Secondary | ICD-10-CM | POA: Diagnosis not present

## 2020-05-31 DIAGNOSIS — S81812D Laceration without foreign body, left lower leg, subsequent encounter: Secondary | ICD-10-CM | POA: Diagnosis not present

## 2020-05-31 DIAGNOSIS — I5042 Chronic combined systolic (congestive) and diastolic (congestive) heart failure: Secondary | ICD-10-CM | POA: Diagnosis not present

## 2020-06-02 DIAGNOSIS — E1122 Type 2 diabetes mellitus with diabetic chronic kidney disease: Secondary | ICD-10-CM | POA: Diagnosis not present

## 2020-06-02 DIAGNOSIS — M1030 Gout due to renal impairment, unspecified site: Secondary | ICD-10-CM | POA: Diagnosis not present

## 2020-06-02 DIAGNOSIS — J849 Interstitial pulmonary disease, unspecified: Secondary | ICD-10-CM | POA: Diagnosis not present

## 2020-06-02 DIAGNOSIS — N184 Chronic kidney disease, stage 4 (severe): Secondary | ICD-10-CM | POA: Diagnosis not present

## 2020-06-02 DIAGNOSIS — Z48812 Encounter for surgical aftercare following surgery on the circulatory system: Secondary | ICD-10-CM | POA: Diagnosis not present

## 2020-06-02 DIAGNOSIS — I130 Hypertensive heart and chronic kidney disease with heart failure and stage 1 through stage 4 chronic kidney disease, or unspecified chronic kidney disease: Secondary | ICD-10-CM | POA: Diagnosis not present

## 2020-06-02 DIAGNOSIS — I5043 Acute on chronic combined systolic (congestive) and diastolic (congestive) heart failure: Secondary | ICD-10-CM | POA: Diagnosis not present

## 2020-06-02 DIAGNOSIS — I081 Rheumatic disorders of both mitral and tricuspid valves: Secondary | ICD-10-CM | POA: Diagnosis not present

## 2020-06-02 DIAGNOSIS — I4821 Permanent atrial fibrillation: Secondary | ICD-10-CM | POA: Diagnosis not present

## 2020-06-03 ENCOUNTER — Telehealth: Payer: Self-pay

## 2020-06-03 DIAGNOSIS — Z48812 Encounter for surgical aftercare following surgery on the circulatory system: Secondary | ICD-10-CM | POA: Diagnosis not present

## 2020-06-03 DIAGNOSIS — S81812D Laceration without foreign body, left lower leg, subsequent encounter: Secondary | ICD-10-CM | POA: Diagnosis not present

## 2020-06-03 DIAGNOSIS — Z950 Presence of cardiac pacemaker: Secondary | ICD-10-CM | POA: Diagnosis not present

## 2020-06-03 DIAGNOSIS — M109 Gout, unspecified: Secondary | ICD-10-CM | POA: Diagnosis not present

## 2020-06-03 DIAGNOSIS — I5042 Chronic combined systolic (congestive) and diastolic (congestive) heart failure: Secondary | ICD-10-CM | POA: Diagnosis not present

## 2020-06-03 DIAGNOSIS — E119 Type 2 diabetes mellitus without complications: Secondary | ICD-10-CM | POA: Diagnosis not present

## 2020-06-03 DIAGNOSIS — I110 Hypertensive heart disease with heart failure: Secondary | ICD-10-CM | POA: Diagnosis not present

## 2020-06-03 DIAGNOSIS — M6281 Muscle weakness (generalized): Secondary | ICD-10-CM | POA: Diagnosis not present

## 2020-06-03 DIAGNOSIS — Z952 Presence of prosthetic heart valve: Secondary | ICD-10-CM | POA: Diagnosis not present

## 2020-06-03 NOTE — Telephone Encounter (Signed)
Please advise. Thank you

## 2020-06-03 NOTE — Telephone Encounter (Signed)
Yes. Pls give orders.  Thx, Dr. Darene Lamer

## 2020-06-03 NOTE — Telephone Encounter (Signed)
Amy with [name] called requesting home health orders to resume his care.  He has been in the hospital.  2 x a week for 4 weeks and 1 x for  4 weeks.  She said ok to leave verbal orders on voicemail. [phone]

## 2020-06-06 ENCOUNTER — Other Ambulatory Visit (HOSPITAL_COMMUNITY)
Admission: RE | Admit: 2020-06-06 | Discharge: 2020-06-06 | Disposition: A | Discharge status: Home / Self Care | Payer: Medicare HMO | Source: Ambulatory Visit | Attending: Internal Medicine | Admitting: Internal Medicine

## 2020-06-06 DIAGNOSIS — Z01812 Encounter for preprocedural laboratory examination: Secondary | ICD-10-CM | POA: Diagnosis not present

## 2020-06-06 DIAGNOSIS — Z20822 Contact with and (suspected) exposure to covid-19: Secondary | ICD-10-CM | POA: Diagnosis not present

## 2020-06-06 NOTE — Telephone Encounter (Signed)
Verbal orders given to amy on vm.

## 2020-06-07 DIAGNOSIS — I5042 Chronic combined systolic (congestive) and diastolic (congestive) heart failure: Secondary | ICD-10-CM | POA: Diagnosis not present

## 2020-06-07 DIAGNOSIS — S81812D Laceration without foreign body, left lower leg, subsequent encounter: Secondary | ICD-10-CM | POA: Diagnosis not present

## 2020-06-07 DIAGNOSIS — I5043 Acute on chronic combined systolic (congestive) and diastolic (congestive) heart failure: Secondary | ICD-10-CM | POA: Diagnosis not present

## 2020-06-07 DIAGNOSIS — Z950 Presence of cardiac pacemaker: Secondary | ICD-10-CM | POA: Diagnosis not present

## 2020-06-07 DIAGNOSIS — M1030 Gout due to renal impairment, unspecified site: Secondary | ICD-10-CM | POA: Diagnosis not present

## 2020-06-07 DIAGNOSIS — I4821 Permanent atrial fibrillation: Secondary | ICD-10-CM | POA: Diagnosis not present

## 2020-06-07 DIAGNOSIS — I110 Hypertensive heart disease with heart failure: Secondary | ICD-10-CM | POA: Diagnosis not present

## 2020-06-07 DIAGNOSIS — M6281 Muscle weakness (generalized): Secondary | ICD-10-CM | POA: Diagnosis not present

## 2020-06-07 DIAGNOSIS — Z48812 Encounter for surgical aftercare following surgery on the circulatory system: Secondary | ICD-10-CM | POA: Diagnosis not present

## 2020-06-07 DIAGNOSIS — I130 Hypertensive heart and chronic kidney disease with heart failure and stage 1 through stage 4 chronic kidney disease, or unspecified chronic kidney disease: Secondary | ICD-10-CM | POA: Diagnosis not present

## 2020-06-07 DIAGNOSIS — N184 Chronic kidney disease, stage 4 (severe): Secondary | ICD-10-CM | POA: Diagnosis not present

## 2020-06-07 DIAGNOSIS — J849 Interstitial pulmonary disease, unspecified: Secondary | ICD-10-CM | POA: Diagnosis not present

## 2020-06-07 DIAGNOSIS — E119 Type 2 diabetes mellitus without complications: Secondary | ICD-10-CM | POA: Diagnosis not present

## 2020-06-07 DIAGNOSIS — I081 Rheumatic disorders of both mitral and tricuspid valves: Secondary | ICD-10-CM | POA: Diagnosis not present

## 2020-06-07 DIAGNOSIS — E1122 Type 2 diabetes mellitus with diabetic chronic kidney disease: Secondary | ICD-10-CM | POA: Diagnosis not present

## 2020-06-07 DIAGNOSIS — Z952 Presence of prosthetic heart valve: Secondary | ICD-10-CM | POA: Diagnosis not present

## 2020-06-07 DIAGNOSIS — M109 Gout, unspecified: Secondary | ICD-10-CM | POA: Diagnosis not present

## 2020-06-07 LAB — SARS CORONAVIRUS 2 (TAT 6-24 HRS): SARS Coronavirus 2: NEGATIVE

## 2020-06-07 NOTE — Pre-Procedure Instructions (Signed)
Instructed patient on the following items: Arrival time 1030 Nothing to eat or drink after midnight No meds AM of procedure Responsible person to drive you home and stay with you for 24 hrs Wash with special soap night before and morning of procedure If on anti-coagulant drug instructions-Eliquis stop 2 days ago

## 2020-06-08 ENCOUNTER — Other Ambulatory Visit: Payer: Self-pay

## 2020-06-08 ENCOUNTER — Ambulatory Visit: Payer: Medicare HMO | Admitting: Physician Assistant

## 2020-06-08 ENCOUNTER — Other Ambulatory Visit (HOSPITAL_COMMUNITY): Payer: Medicare HMO

## 2020-06-08 ENCOUNTER — Ambulatory Visit (HOSPITAL_COMMUNITY)
Admission: RE | Disposition: A | Discharge status: Home / Self Care | Payer: Self-pay | Source: Home / Self Care | Attending: Internal Medicine

## 2020-06-08 ENCOUNTER — Ambulatory Visit (HOSPITAL_COMMUNITY)
Admission: RE | Admit: 2020-06-08 | Discharge: 2020-06-08 | Disposition: A | Discharge status: Home / Self Care | Payer: Medicare HMO | Attending: Internal Medicine | Admitting: Internal Medicine

## 2020-06-08 ENCOUNTER — Ambulatory Visit (HOSPITAL_COMMUNITY): Payer: Medicare HMO

## 2020-06-08 DIAGNOSIS — Z79899 Other long term (current) drug therapy: Secondary | ICD-10-CM | POA: Diagnosis not present

## 2020-06-08 DIAGNOSIS — Z7901 Long term (current) use of anticoagulants: Secondary | ICD-10-CM | POA: Diagnosis not present

## 2020-06-08 DIAGNOSIS — Z8249 Family history of ischemic heart disease and other diseases of the circulatory system: Secondary | ICD-10-CM | POA: Insufficient documentation

## 2020-06-08 DIAGNOSIS — I442 Atrioventricular block, complete: Secondary | ICD-10-CM | POA: Diagnosis not present

## 2020-06-08 DIAGNOSIS — Z950 Presence of cardiac pacemaker: Secondary | ICD-10-CM | POA: Diagnosis not present

## 2020-06-08 DIAGNOSIS — I517 Cardiomegaly: Secondary | ICD-10-CM | POA: Diagnosis not present

## 2020-06-08 DIAGNOSIS — Z794 Long term (current) use of insulin: Secondary | ICD-10-CM | POA: Diagnosis not present

## 2020-06-08 DIAGNOSIS — Z87891 Personal history of nicotine dependence: Secondary | ICD-10-CM | POA: Diagnosis not present

## 2020-06-08 DIAGNOSIS — I110 Hypertensive heart disease with heart failure: Secondary | ICD-10-CM | POA: Insufficient documentation

## 2020-06-08 DIAGNOSIS — I4819 Other persistent atrial fibrillation: Secondary | ICD-10-CM | POA: Insufficient documentation

## 2020-06-08 DIAGNOSIS — I5022 Chronic systolic (congestive) heart failure: Secondary | ICD-10-CM | POA: Insufficient documentation

## 2020-06-08 DIAGNOSIS — J811 Chronic pulmonary edema: Secondary | ICD-10-CM | POA: Diagnosis not present

## 2020-06-08 HISTORY — PX: BIV UPGRADE: EP1202

## 2020-06-08 LAB — GLUCOSE, CAPILLARY: Glucose-Capillary: 179 mg/dL — ABNORMAL HIGH (ref 70–99)

## 2020-06-08 SURGERY — BIV UPGRADE

## 2020-06-08 MED ORDER — CEFAZOLIN SODIUM-DEXTROSE 2-4 GM/100ML-% IV SOLN
INTRAVENOUS | Status: AC
  Filled 2020-06-08: qty 100

## 2020-06-08 MED ORDER — SODIUM CHLORIDE 0.9 % IV SOLN
INTRAVENOUS | Status: AC
  Filled 2020-06-08: qty 2

## 2020-06-08 MED ORDER — SODIUM CHLORIDE 0.9 % IV SOLN: INTRAVENOUS | Status: DC

## 2020-06-08 MED ORDER — ONDANSETRON HCL 4 MG/2ML IJ SOLN: 4.0000 mg | Freq: Four times a day (QID) | INTRAMUSCULAR | Status: DC | PRN

## 2020-06-08 MED ORDER — HEPARIN (PORCINE) IN NACL 1000-0.9 UT/500ML-% IV SOLN
INTRAVENOUS | Status: DC | PRN
  Administered 2020-06-08: 500 mL

## 2020-06-08 MED ORDER — SODIUM CHLORIDE 0.9 % IV SOLN: 80.0000 mg | INTRAVENOUS | Status: DC

## 2020-06-08 MED ORDER — LIDOCAINE HCL 1 % IJ SOLN
INTRAMUSCULAR | Status: AC
  Filled 2020-06-08: qty 60

## 2020-06-08 MED ORDER — MIDAZOLAM HCL 5 MG/5ML IJ SOLN
INTRAMUSCULAR | Status: AC
  Filled 2020-06-08: qty 5

## 2020-06-08 MED ORDER — FENTANYL CITRATE (PF) 100 MCG/2ML IJ SOLN
INTRAMUSCULAR | Status: AC
  Filled 2020-06-08: qty 2

## 2020-06-08 MED ORDER — POVIDONE-IODINE 10 % EX SWAB
2.0000 "application " | Freq: Once | CUTANEOUS | Status: AC
  Administered 2020-06-08: 2 via TOPICAL

## 2020-06-08 MED ORDER — IOHEXOL 350 MG/ML SOLN
INTRAVENOUS | Status: DC | PRN
  Administered 2020-06-08: 25 mL

## 2020-06-08 MED ORDER — LIDOCAINE HCL (PF) 1 % IJ SOLN
INTRAMUSCULAR | Status: DC | PRN
  Administered 2020-06-08: 45 mL

## 2020-06-08 MED ORDER — HEPARIN (PORCINE) IN NACL 1000-0.9 UT/500ML-% IV SOLN
INTRAVENOUS | Status: AC
  Filled 2020-06-08: qty 500

## 2020-06-08 MED ORDER — CHLORHEXIDINE GLUCONATE 4 % EX LIQD
4.0000 "application " | Freq: Once | CUTANEOUS | Status: DC
  Filled 2020-06-08: qty 60

## 2020-06-08 MED ORDER — CEFAZOLIN SODIUM-DEXTROSE 2-4 GM/100ML-% IV SOLN
2.0000 g | INTRAVENOUS | Status: AC
  Administered 2020-06-08: 2 g via INTRAVENOUS

## 2020-06-08 MED ORDER — MIDAZOLAM HCL 5 MG/5ML IJ SOLN
INTRAMUSCULAR | Status: DC | PRN
  Administered 2020-06-08 (×2): 2 mg via INTRAVENOUS

## 2020-06-08 MED ORDER — ACETAMINOPHEN 325 MG PO TABS
325.0000 mg | ORAL_TABLET | ORAL | Status: DC | PRN
Start: 2020-06-08 — End: 2020-06-08
  Filled 2020-06-08: qty 2

## 2020-06-08 MED ORDER — FENTANYL CITRATE (PF) 100 MCG/2ML IJ SOLN
INTRAMUSCULAR | Status: DC | PRN
  Administered 2020-06-08: 12.5 ug via INTRAVENOUS
  Administered 2020-06-08: 25 ug via INTRAVENOUS

## 2020-06-08 SURGICAL SUPPLY — 6 items
CABLE SURGICAL S-101-97-12 (CABLE) ×2 IMPLANT
GUIDEWIRE ANGLED .035X150CM (WIRE) ×2 IMPLANT
KIT MICROPUNCTURE NIT STIFF (SHEATH) ×2 IMPLANT
PAD PRO RADIOLUCENT 2001M-C (PAD) ×2 IMPLANT
SHEATH 9.5FR PRELUDE SNAP 13 (SHEATH) ×2 IMPLANT
TRAY PACEMAKER INSERTION (PACKS) ×2 IMPLANT

## 2020-06-08 NOTE — Progress Notes (Signed)
Dr. Lovena Le in to see pt.

## 2020-06-08 NOTE — Discharge Instructions (Signed)
Care After This sheet gives you information about how to care for yourself after your procedure. Your health care provider may also give you more specific instructions. If you have problems or questions, contact your health care provider. What can I expect after the procedure? After the procedure, it is common to have these symptoms at the site where the pacemaker was inserted:  Mild pain or soreness.  Slight bruising.  Some swelling over the incisions.  A slight bump over the skin where the device was placed (if it was implanted in the upper chest area). Sometimes, it is possible to feel the device under the skin. This is normal. Follow these instructions at home: Incision care  Keep the incision clean and dry for 2-3 days after the procedure or as told by your health care provider. It takes several weeks for the incision site to completely heal.  Do not remove the bandage (dressing) on your chest until told to do so by your health care provider.  Leave stitches (sutures), skin glue, or adhesive strips in place. These skin closures may need to stay in place for 2 weeks or longer. If adhesive strip edges start to loosen and curl up, you may trim the loose edges. Do not remove adhesive strips completely unless your health care provider tells you to do that.  Do not take baths, swim, or use a hot tub for 7-10 days or until your health care provider approves. Ask your health care provider if you may take showers. You may only be allowed to take sponge baths.  Pat the incision area dry with a clean towel. Do not rub the area. This may cause bleeding.  Check your incision area every day for signs of infection. Check for: ? More redness, swelling, or pain. ? Fluid or blood. ? Warmth. ? Pus or a bad smell.  Avoid putting pressure on the area where the pacemaker was placed. Women may want to place a small pad over the incision site to protect it from their bra strap.   Medicines  Take  over-the-counter and prescription medicines only as told by your health care provider.  If you were prescribed an antibiotic medicine, take it as told by your health care provider. Do not stop taking the antibiotic even if you start to feel better. Activity  For the first 2 weeks, or as long as told by your health care provider: ? Avoid lifting your left arm higher than your shoulder. ? Be gentle when you move your arms over your head. It is okay to raise your arm to comb your hair. ? Avoid exercise or activities that take a lot of effort.  Ask your health care provider when it is okay to: ? Return to your normal activities. ? Return to work or school. ? Resume sexual activity.  If you were given a medicine to help you relax (sedative) during the procedure, it can affect you for several hours. Do not drive or operate machinery until your health care provider says that it is safe. General instructions  Do not use any products that contain nicotine or tobacco, such as cigarettes, e-cigarettes, and chewing tobacco. These can delay incision healing after surgery. If you need help quitting, ask your health care provider.  Always let all health care providers, including dentists, know about your pacemaker before you have any medical procedures or tests.  You may be shown how to transfer data from your pacemaker through the phone to your health care provider.  Wear a medical ID bracelet or necklace stating that you have a pacemaker, and carry a pacemaker ID card with you at all times.  Avoid close and prolonged exposure to electrical devices that have strong magnetic fields. These include: ? Airport Data processing manager. When at the airport, let officials know that you have a pacemaker. Carry your pacemaker ID card. ? Metal detectors. If you must pass through a metal detector, walk through it quickly. Do not stop under the detector or stand near it.  When using your mobile phone, hold it to  the ear opposite the pacemaker. Do not leave your mobile phone in a pocket over the pacemaker.  Your pacemaker battery will last for 5-15 years. Your health care provider will do routine checks to know when the battery is starting to run down. When this happens, the pacemaker will need to be replaced.  Keep all follow-up visits as told by your health care provider. This is important. Contact a health care provider if:  You have pain at the incision site that is not relieved by medicines.  You have any of these signs of infection: ? More redness, swelling, or pain around your incision. ? Fluid or blood coming from your incision. ? Warmth coming from your incision. ? Pus or a bad smell coming from your incision. ? A fever.  You feel brief, occasional palpitations, light-headedness, or any symptoms that you think might be related to your heart. Get help right away if:  You have chest pain that is different from the pain at the pacemaker site.  You develop a red streak that extends above or below the incision site.  You have shortness of breath.  You have palpitations or an irregular heartbeat.  You have light-headedness that does not go away quickly.  You faint or have dizzy spells.  Your pulse suddenly drops or increases rapidly and does not return to normal.  You gain weight and your legs and ankles swell. Summary  After the procedure, it is common to have pain, soreness, and some swelling or bruising where the pacemaker was inserted.  Keep your incision clean and dry. Follow instructions from your health care provider about how to take care of your incision.  Check your incision every day for signs of infection, such as more pain or swelling, pus or a bad smell, warmth, or leaking fluid or blood.  Carry a pacemaker ID card with you at all times. This information is not intended to replace advice given to you by your health care provider. Make sure you discuss any questions  you have with your health care provider. Document Revised: 02/12/2019 Document Reviewed: 02/12/2019 Elsevier Patient Education  2021 [employer].

## 2020-06-08 NOTE — Progress Notes (Signed)
Pt to Radiology via wheelchair.

## 2020-06-08 NOTE — H&P (Signed)
HPI [name] returns today for followup. He is a pleasant 75 yo man with a h/o CHB, s/p PPM insertion. He has persistent atrial fib. He underwent PPM insertion in 2017 and had an atrial lead dislodge. He has developed worsening sob and CHF and now peripheral edema. He is thought to have low output AS. He has had worsening peripheral edema. No syncope.  No Known Allergies         Current Outpatient Medications  Medication Sig Dispense Refill  . ACCU-CHEK AVIVA PLUS test strip TEST BLOOD SUGAR TWICE DAILY BEFORE BREAKFAST AND AT BEDTIME 200 strip 1  . apixaban (ELIQUIS) 5 MG TABS tablet Take 1 tablet (5 mg total) by mouth 2 (two) times daily. 180 tablet 3  . benzonatate (TESSALON) 100 MG capsule Take 1 capsule (100 mg total) by mouth 3 (three) times daily as needed for cough. 90 capsule 3  . HYDROcodone-acetaminophen (NORCO/VICODIN) 5-325 MG tablet Take one tablet po every 4 hrs prn pain (Patient taking differently: Take 1 tablet by mouth every 4 (four) hours as needed for moderate pain.) 28 tablet 0  . hydrocortisone 2.5 % cream Apply BID to affected area (Patient taking differently: Apply 1 application topically 2 (two) times daily as needed (skin irritation.).) 60 g 3  . insulin degludec (TRESIBA FLEXTOUCH) 200 UNIT/ML FlexTouch Pen Inject 50 Units into the skin at bedtime. 23 mL 0  . Insulin Pen Needle (BD PEN NEEDLE NANO U/F) 32G X 4 MM MISC 1 each by Does not apply route 4 (four) times daily. 150 each 5  . Multiple Vitamin (MULTIVITAMIN WITH MINERALS) TABS tablet Take 1 tablet by mouth daily. One A Day for Men    . potassium chloride SA (KLOR-CON) 20 MEQ tablet Take 40 mEq by mouth 2 (two) times daily.    Marland Kitchen torsemide (DEMADEX) 20 MG tablet Take 2 tablets (40 mg total) by mouth daily. TAKE 2 TABLETS (40 MG TOTAL) BY MOUTH 2 (TWO) TIMES DAILY. 60 tablet 1  . TRULICITY 1.5 ZC/5.8IF SOPN INJECT 1.5MG (1 PEN) SUBCUTANEOUSLY EVERY WEEK 2 mL 2  . Zinc 50 MG TABS Take 50 mg by  mouth daily.     No current facility-administered medications for this visit.         Past Medical History:  Diagnosis Date  . Atrial flutter ([place]) 12/2010   Admitted with symptomatic bradycardia (HR 40s), atrial flutter with slow ventricular response 12/2010 + volume overload; AV nodal agents d/c'd and Pradaxa started; RFA in 01/2011  . CHF (congestive heart failure) ([place])   . Chronic combined systolic and diastolic heart failure ([place])    a. echo 01/06/11: mild LVH, EF 65-70%, mod to severe LAE, mild RVE, mild RAE, PASP 32;   TEE 10/12: EF 45-50% b. EF 45 to 50% by echo in 07/2018  . Class 2 severe obesity due to excess calories with serious comorbidity and body mass index (BMI) of 37.0 to 37.9 in adult ([place]) 07/17/2017  . Diabetes mellitus    non insulin dependant  . Hyperlipidemia   . Hypertension   . Osteoarthritis   . Presence of permanent cardiac pacemaker   . PSVT (paroxysmal supraventricular tachycardia) ([facility])    Possibly atrial flutter    ROS:   All systems reviewed and negative except as noted in the HPI.        Past Surgical History:  Procedure Laterality Date  . ATRIAL FLUTTER ABLATION N/A 02/07/2011   Procedure: ATRIAL FLUTTER  ABLATION;  Surgeon: John [name], John [name];  Location: [facility] CATH LAB;  Service: Cardiovascular;  Laterality: N/A;  . CARDIAC ELECTROPHYSIOLOGY STUDY AND ABLATION  02/07/11  . CARDIOVERSION N/A 03/23/2016   Procedure: CARDIOVERSION;  Surgeon: John [name], John [name];  Location: [facility];  Service: Cardiovascular;  Laterality: N/A;  . COLONOSCOPY N/A 04/17/2017   Procedure: COLONOSCOPY;  Surgeon: John [name], John [name];  Location: [facility];  Service: Endoscopy;  Laterality: N/A;  830  . EP IMPLANTABLE DEVICE N/A 08/29/2015   Procedure: Pacemaker Implant;  Surgeon: John [name], John [name];  Location: [facility] CV LAB;  Service: Cardiovascular;  Laterality: N/A;  . EP IMPLANTABLE DEVICE N/A 12/07/2015   Procedure: PPM Lead  Revision/Repair;  Surgeon: John [name], John [name];  Location: [facility] CV LAB;  Service: Cardiovascular;  Laterality: N/A;  . KNEE ARTHROSCOPY  2005   left  . LUMBAR SPINE SURGERY     "I've had 6 ORs 1972 thru 2004"  . POLYPECTOMY  04/17/2017   Procedure: POLYPECTOMY;  Surgeon: John [name], John [name];  Location: [facility];  Service: Endoscopy;;  transverse colon x3;  . RIGHT/LEFT HEART CATH AND CORONARY ANGIOGRAPHY N/A 12/21/2019   Procedure: RIGHT/LEFT HEART CATH AND CORONARY ANGIOGRAPHY;  Surgeon: John [name], John [name];  Location: [facility] LAB;  Service: Cardiovascular;  Laterality: N/A;  . TEE WITHOUT CARDIOVERSION N/A 11/05/2019   Procedure: TRANSESOPHAGEAL ECHOCARDIOGRAM (TEE) WITH PROPOFOL;  Surgeon: John [name], John [name];  Location: [facility];  Service: Endoscopy;  Laterality: N/A;          Family History  Problem Relation Age of Onset  . Heart attack Mother   . Hypertension Mother   . Heart attack Father   . Hypertension Father   . Heart attack Brother   . Colon cancer Neg Hx      Social History        Socioeconomic History  . Marital status: Married    Spouse name: Not on file  . Number of children: 2  . Years of education: Not on file  . Highest education level: Not on file  Occupational History  . Occupation: Immunologist: RETIRED  Tobacco Use  . Smoking status: Former Smoker    Packs/day: 1.00    Years: 10.00    Pack years: 10.00    Types: Cigarettes    Quit date: 03/26/1986    Years since quitting: 34.0  . Smokeless tobacco: Never Used  . Tobacco comment: "stopped cigarette  smoking 1988"  Vaping Use  . Vaping Use: Never used  Substance and Sexual Activity  . Alcohol use: Not Currently    Alcohol/week: 0.0 standard drinks    Comment: "quit alcohol ~ 2007"  . Drug use: No  . Sexual activity: Yes    Partners: Female  Other Topics Concern  . Not on file  Social History Narrative   . Not on file   Social Determinants of Health   Financial Resource Strain: Not on file  Food Insecurity: Not on file  Transportation Needs: Not on file  Physical Activity: Not on file  Stress: Not on file  Social Connections: Not on file  Intimate Partner Violence: Not on file     BP 118/78   Pulse 82   Ht 6' (1.829 m)   Wt 234 lb 9.6 oz (106.4 kg)   SpO2 98%   BMI 31.82 kg/m   Physical Exam:  stable appearing NAD HEENT: Unremarkable Neck:  No JVD, no thyromegally Lymphatics:  No adenopathy Back:  No CVA tenderness Lungs:  Clear with basilar rales HEART:  Regular rate rhythm, no murmurs, no rubs, no clicks Abd:  soft, positive bowel sounds, no organomegally, no rebound, no guarding Ext:  2 plus pulses, weaping peripheral edema, 3+,  no cyanosis, no clubbing Skin:  No rashes no nodules Neuro:  CN II through XII intact, motor grossly intact  EKG - reviewed. Atrial fib with ventricular pacing, QRS 190  DEVICE  Normal device function.  See PaceArt for details.   Assess/Plan: 1. Chronic systolilc heart failure - I have discussed the treatment options. I will review with John [name] and John [name] regarding timing of procedures. I could perform biv upgrade in the next couple of weeks.  2. Atrial fib - I suspect that his atrial fib is not helping but as he has been out of rhythm for a couple of years, less likely to be able to get him back in NSR. 3. AS - he will followup with John [name] regarding possible TAVR. 4. PPM -his medtronic DDD PM is working normally.  John Overlie Kareema Keitt,John [name]  Addendum:  I discussed the case with John [name]. He will undergo TAVR and if his CHF symptoms remain, then we will consider upgrade of his device.  EP Attendig  Patient seen and examined. Agree with above. The patient has undergone TAVR and presents for upgrade to a biv PPM. He has pacing induced LBBB with a QRS duration of 190 ms. Hopefully his left upper extremity vein will be  patent.  John Overlie Sydna Brodowski,John [name]

## 2020-06-08 NOTE — Progress Notes (Signed)
Dr Lovena Le States no need to tele monitor.

## 2020-06-08 NOTE — Progress Notes (Signed)
Discharge instructions reviewed with pt. Voices understanding. Attempted to call pt wife message left for her to return call.

## 2020-06-08 NOTE — Progress Notes (Signed)
Spoke with both patient and wife// pt can have blood products, patient said he was confused with the question and wife also stated the same, he can have blood products, in the chart under active FYI it stated that pt declines blood products/ I tried to remove the statement.

## 2020-06-09 ENCOUNTER — Encounter (HOSPITAL_COMMUNITY): Payer: Self-pay | Admitting: Internal Medicine

## 2020-06-09 ENCOUNTER — Encounter: Payer: Medicare HMO | Admitting: Internal Medicine

## 2020-06-09 ENCOUNTER — Other Ambulatory Visit: Payer: Self-pay | Admitting: Physician Assistant

## 2020-06-09 DIAGNOSIS — Z952 Presence of prosthetic heart valve: Secondary | ICD-10-CM

## 2020-06-09 MED FILL — Lidocaine HCl Local Inj 1%: INTRAMUSCULAR | Qty: 60 | Status: AC

## 2020-06-09 MED FILL — Gentamicin Sulfate Inj 40 MG/ML: INTRAMUSCULAR | Qty: 80 | Status: AC

## 2020-06-13 ENCOUNTER — Telehealth: Payer: Self-pay | Admitting: Internal Medicine

## 2020-06-13 NOTE — Telephone Encounter (Signed)
New Message   Patient wife John [name] called , she said that John [name] has been complaining of left arm pain, with a fluttering feeling in his chest.  No chest pain or SOB, patient wife would like a call back.

## 2020-06-13 NOTE — Telephone Encounter (Addendum)
Returned call to patients spouse. She stated that patient has been c/o left arm pain for 3 days, but the fluttering feeling started last night (3/20). Patient has kept track of bp for the last 3 days: 3/18- 113/63 3/19- 107/60 3/20 - 110/60  Patient does not take Nitroglycerin and denies SOB or chest tightness. Wife states that patient was unable to receive PPM upgrade. Patietn is scheduled for a f/u appt with Dr. Haroldine Laws on 07/20/2020. Please advise.

## 2020-06-13 NOTE — Telephone Encounter (Signed)
Are there any PA appts open this week?  Zandra Abts MD

## 2020-06-13 NOTE — Telephone Encounter (Signed)
Unfortunately, no. Bernerd Pho, PA-C's next [facility] is not until April 5th and Ermalinda Barrios, [place] is not available until April 18th.

## 2020-06-14 ENCOUNTER — Ambulatory Visit: Payer: Medicare HMO | Admitting: Family Medicine

## 2020-06-14 DIAGNOSIS — Z48812 Encounter for surgical aftercare following surgery on the circulatory system: Secondary | ICD-10-CM | POA: Diagnosis not present

## 2020-06-14 DIAGNOSIS — E1122 Type 2 diabetes mellitus with diabetic chronic kidney disease: Secondary | ICD-10-CM | POA: Diagnosis not present

## 2020-06-14 DIAGNOSIS — J849 Interstitial pulmonary disease, unspecified: Secondary | ICD-10-CM | POA: Diagnosis not present

## 2020-06-14 DIAGNOSIS — I4821 Permanent atrial fibrillation: Secondary | ICD-10-CM | POA: Diagnosis not present

## 2020-06-14 DIAGNOSIS — I130 Hypertensive heart and chronic kidney disease with heart failure and stage 1 through stage 4 chronic kidney disease, or unspecified chronic kidney disease: Secondary | ICD-10-CM | POA: Diagnosis not present

## 2020-06-14 DIAGNOSIS — N184 Chronic kidney disease, stage 4 (severe): Secondary | ICD-10-CM | POA: Diagnosis not present

## 2020-06-14 DIAGNOSIS — I081 Rheumatic disorders of both mitral and tricuspid valves: Secondary | ICD-10-CM | POA: Diagnosis not present

## 2020-06-14 DIAGNOSIS — I5043 Acute on chronic combined systolic (congestive) and diastolic (congestive) heart failure: Secondary | ICD-10-CM | POA: Diagnosis not present

## 2020-06-14 DIAGNOSIS — M1030 Gout due to renal impairment, unspecified site: Secondary | ICD-10-CM | POA: Diagnosis not present

## 2020-06-14 NOTE — Telephone Encounter (Signed)
Patient was notified of appointment and verbalized agreement.

## 2020-06-14 NOTE — Telephone Encounter (Signed)
March 25 at 1040 there is an opening in [facility]  Zandra Abts MD

## 2020-06-17 ENCOUNTER — Encounter: Payer: Self-pay | Admitting: Cardiology

## 2020-06-17 ENCOUNTER — Other Ambulatory Visit: Payer: Self-pay

## 2020-06-17 ENCOUNTER — Ambulatory Visit (INDEPENDENT_AMBULATORY_CARE_PROVIDER_SITE_OTHER): Payer: Medicare HMO | Admitting: Cardiology

## 2020-06-17 VITALS — BP 103/68 | HR 80 | Ht 72.0 in | Wt 212.2 lb

## 2020-06-17 DIAGNOSIS — R0789 Other chest pain: Secondary | ICD-10-CM | POA: Diagnosis not present

## 2020-06-17 DIAGNOSIS — R002 Palpitations: Secondary | ICD-10-CM

## 2020-06-17 DIAGNOSIS — I480 Paroxysmal atrial fibrillation: Secondary | ICD-10-CM

## 2020-06-17 MED ORDER — CARVEDILOL 3.125 MG PO TABS: ORAL_TABLET | ORAL | 1 refills | Status: DC

## 2020-06-17 NOTE — Patient Instructions (Signed)
Your physician recommends that you schedule a follow-up appointment in: [place] has recommended you make the following change in your medication:   INCREASE COREG 3.125 MG IN THE MORNING AND 6.25 MG (2 TABLETS) IN THE EVENING   Thank you for choosing [facility]!!

## 2020-06-17 NOTE — Progress Notes (Signed)
Clinical Summary John [name] is a 75 y.o.male seen today for follow up of the following medical problems.   1. Chronic combined systolic/diastolic HF - -10/8889 echo LVEF 45-50%, grade II DDx, normal RV, mild aortic stenosis. - 09/2019 echo LVEF 30-35%,  - 11/2019 RHC/LHC: no significant CAD. CI 1.8, mean PA 30, PCWP 21, LVEDP 24  -from 09/2019 pacemaker check Vpaced about 43% of the time.    - weight today down to 223 lbs. Home weights 217 lbs and stable - no recent edema. Breathing is improving, occasional orthopnea - compliant with meds. Taking 79m bid. STopped metolazone. Awaiting on renal evaluation. He also stopped his adlactone.  - no lightheadedness or dizziness.    04/2020 echo LVEF 30-35%, mild RV dysfuncion, severe BAE, severe MR,AV  mean grad 21, AVA VTI 0.9, DI 0.21 - s/p TAVR 2/8 for low flow low gradient severe AS -  BiV upgrade 06/08/20 attempt, occluded left subclavian vein proceudre aborted  - no recent edema.    2. Aflutter/afib - s/p ablation in 2012 - he is in chronic afib    3. Complete heart block - had pacemaker placed in 2017 - [place].   4. HTN - compliant with meds   6. Hyperlipidemia -07/2019 T C 178 TG 100 HDL 51 LDL 107   7. CKD 3 - uptrend in Cr to 1.6, pcp ahs referred to renal.   8. Aortic stenosis - 07/2018 echo AVA VTI 1, mean grad 11.5, DI 0.23, SVI 33 - 09/2019 echo LVEF 30-35%, mean grad 16, AVA VTI 1.02, DI 0.23 -- 12/21/19 cath: AV mean grad 21, AVA VTI 1.3  04/2020 echo LVEF 30-35%,AV  mean grad 21, AVA VTI 0.9, DI 0.21 - referred to valve clinic for low flow low gradient severe AS - s/p TAVR 05/03/20 29 mm Sapien 3 valve  - post TAVR echo 04/2020 normal function    9. Mitral regurgitation - 09/2019 echo mod to severe MR - 10/2019 TEE: mod MR, MR VC 0.4 cm.   9. Abnormal liver tests - followed by GI  10. Chest wall hematoma - 04/2020 CT chest:  Hematoma of the left mid to lower posterolateral chest wall. No sign of underlying rib fracture. Left chest wall hemorrhage extends into the upper abdominal body wall region. - unclear etiolgy   11. Fluttering - isolated episode of fluttering, last 4-5 seconds. Pain in left arm, aching like pain. No other associated symptoms. Occurred in bed, awoke from sleep - 3 more episodes of arm pain without fluttering. Better with lidocaine patch. Pain lasted 12-14 hours. Upper bicep to mid forearm, no numbness or tingling.   Past Medical History:  Diagnosis Date  . [place]   Admitted with symptomatic bradycardia (HR 40s), atrial flutter with slow ventricular response 12/2010 + volume overload; AV nodal agents d/c'd and Pradaxa started; RFA in 01/2011  . CHF (congestive heart failure) ([place]   . Chronic combined systolic and diastolic heart failure ([place]    a. echo 01/06/11: mild LVH, EF 65-70%, mod to severe LAE, mild RVE, mild RAE, PASP 32;   TEE 10/12: EF 45-50% b. EF 45 to 50% by echo in 07/2018  . Class 2 severe obesity due to excess calories with serious comorbidity and body mass index (BMI) of 37.0 to 37.9 in adult ([place] 07/17/2017  . Diabetes mellitus    non insulin dependant  . Hematoma, chest wall 05/15/2020  .  Hyperlipidemia   . Hypertension   . Osteoarthritis   . 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 Presence of permanent cardiac pacemaker   . PSVT (paroxysmal supraventricular tachycardia) (HCC)    Possibly atrial flutter  . S/P TAVR (transcatheter aortic valve replacement) 05/03/2020   s/p TAVR with a 29 mm Edwards S3U via the subclavian approach with Dr. Angelena Form & Dr. Cyndia Bent   . Severe aortic stenosis      No Known Allergies   Current Outpatient Medications  Medication Sig Dispense Refill  . ACCU-CHEK AVIVA PLUS test strip TEST BLOOD SUGAR TWICE DAILY BEFORE BREAKFAST AND AT BEDTIME (Patient taking differently: 1 each by Other route 2 (two) times daily.) 200  strip 1  . acetaminophen (TYLENOL) 325 MG tablet Take 2 tablets (650 mg total) by mouth every 4 (four) hours as needed for headache or mild pain.    Marland Kitchen allopurinol (ZYLOPRIM) 300 MG tablet Take 0.5 tablets (150 mg total) by mouth daily. 30 tablet 6  . apixaban (ELIQUIS) 5 MG TABS tablet Take 1 tablet (5 mg total) by mouth 2 (two) times daily. 180 tablet 3  . aspirin 81 MG chewable tablet Chew 1 tablet (81 mg total) by mouth daily.    . benzonatate (TESSALON) 100 MG capsule Take 1 capsule (100 mg total) by mouth 3 (three) times daily as needed for cough. 90 capsule 3  . carvedilol (COREG) 3.125 MG tablet Take 1 tablet (3.125 mg total) by mouth 2 (two) times daily with a meal. 60 tablet 6  . colchicine 0.6 MG tablet Take 1 tablet (0.6 mg total) by mouth daily as needed. (Patient taking differently: Take 0.6 mg by mouth daily as needed (gout).) 30 tablet 6  . dapagliflozin propanediol (FARXIGA) 10 MG TABS tablet Take 1 tablet (10 mg total) by mouth daily before breakfast. 30 tablet 3  . diphenoxylate-atropine (LOMOTIL) 2.5-0.025 MG tablet Take 1 tablet by mouth 4 (four) times daily as needed for diarrhea or loose stools.    Marland Kitchen HYDROcodone-acetaminophen (NORCO/VICODIN) 5-325 MG tablet Take one tablet po every 4 hrs prn pain (Patient taking differently: Take 1 tablet by mouth every 4 (four) hours as needed (for pain).) 28 tablet 0  . Insulin Pen Needle (BD PEN NEEDLE NANO U/F) 32G X 4 MM MISC 1 each by Does not apply route 4 (four) times daily. (Patient taking differently: 1 each by Does not apply route as directed.) 150 each 5  . Multiple Vitamins-Minerals (ONE-A-DAY MENS 50+) TABS Take 1 tablet by mouth daily with breakfast.    . potassium chloride SA (KLOR-CON) 20 MEQ tablet Take 1.5 tablets (30 mEq total) by mouth 2 (two) times daily. 90 tablet 6  . spironolactone (ALDACTONE) 25 MG tablet Take 0.5 tablets (12.5 mg total) by mouth daily. 15 tablet 6  . torsemide (DEMADEX) 20 MG tablet Take 2 tablets (40  mg total) by mouth 2 (two) times daily.    . TRULICITY 1.5 UM/3.5TI SOPN INJECT 1.5MG (1 PEN) SUBCUTANEOUSLY EVERY WEEK (Patient taking differently: Inject 1.5 mg into the skin every Sunday.) 2 mL 2   No current facility-administered medications for this visit.     Past Surgical History:  Procedure Laterality Date  . ATRIAL FLUTTER ABLATION N/A 02/07/2011   Procedure: ATRIAL FLUTTER ABLATION;  Surgeon: Evans Lance, MD;  Location[facility] CATH LAB;  Service: Cardiovascular;  Laterality: N/A;  . BIV UPGRADE N/A 06/08/2020   Procedure: BIV PPM UPGRADE;  Surgeon: Evans Lance, MD;  Location: [place] INVASIVE CV  LAB;  Service: Cardiovascular;  Laterality: N/A;  . CARDIAC ELECTROPHYSIOLOGY STUDY AND ABLATION  02/07/11  . CARDIOVERSION N/A 03/23/2016   Procedure: CARDIOVERSION;  Surgeon: Evans Lance, MD;  Location: [place];  Service: Cardiovascular;  Laterality: N/A;  . COLONOSCOPY N/A 04/17/2017   Procedure: COLONOSCOPY;  Surgeon: Rogene Houston, MD;  [facility];  Service: Endoscopy;  Laterality: N/A;  830  . EP IMPLANTABLE DEVICE N/A 08/29/2015   Procedure: Pacemaker Implant;  Surgeon: Evans Lance, MD;  Location: [place] CV LAB;  Service: Cardiovascular;  Laterality: N/A;  . EP IMPLANTABLE DEVICE N/A 12/07/2015   Procedure: PPM Lead Revision/Repair;  Surgeon: Evans Lance, MD;  Location: [place] CV LAB;  Service: Cardiovascular;  Laterality: N/A;  . KNEE ARTHROSCOPY  2005   left  . LUMBAR SPINE SURGERY     "I've had 6 ORs 1972 thru 2004"  . POLYPECTOMY  04/17/2017   Procedure: POLYPECTOMY;  Surgeon: Rogene Houston, MD;  [facility];  Service: Endoscopy;;  transverse colon x3;  . RIGHT HEART CATH N/A 04/28/2020   Procedure: RIGHT HEART CATH;  Surgeon: Jolaine Artist, MD;  Location: [place] CV LAB;  Service: Cardiovascular;  Laterality: N/A;  . RIGHT/LEFT HEART CATH AND CORONARY ANGIOGRAPHY N/A 12/21/2019   Procedure: RIGHT/LEFT HEART CATH AND  CORONARY ANGIOGRAPHY;  Surgeon: Nelva Bush, MD;  Location: [place] CV LAB;  Service: Cardiovascular;  Laterality: N/A;  . TEE WITHOUT CARDIOVERSION N/A 11/05/2019   Procedure: TRANSESOPHAGEAL ECHOCARDIOGRAM (TEE) WITH PROPOFOL;  Surgeon: Arnoldo Lenis, MD;  [facility];  Service: Endoscopy;  Laterality: N/A;  . TEE WITHOUT CARDIOVERSION N/A 05/03/2020   Procedure: TRANSESOPHAGEAL ECHOCARDIOGRAM (TEE);  Surgeon: Burnell Blanks, MD;  Location: [place];  Service: Open Heart Surgery;  Laterality: N/A;     No Known Allergies    Family History  Problem Relation Age of Onset  . Heart attack Mother   . Hypertension Mother   . Heart attack Father   . Hypertension Father   . Heart attack Brother   . Colon cancer Neg Hx      Social History John [name] reports that he quit smoking about 34 years ago. His smoking use included cigarettes. He has a 10.00 pack-year smoking history. He has never used smokeless tobacco. Mr. Gens reports previous alcohol use.   Review of Systems CONSTITUTIONAL: No weight loss, fever, chills, weakness or fatigue.  HEENT: Eyes: No visual loss, blurred vision, double vision or yellow sclerae.No hearing loss, sneezing, congestion, runny nose or sore throat.  SKIN: No rash or itching.  CARDIOVASCULAR: per hpi RESPIRATORY: No shortness of breath, cough or sputum.  GASTROINTESTINAL: No anorexia, nausea, vomiting or diarrhea. No abdominal pain or blood.  GENITOURINARY: No burning on urination, no polyuria NEUROLOGICAL: No headache, dizziness, syncope, paralysis, ataxia, numbness or tingling in the extremities. No change in bowel or bladder control.  MUSCULOSKELETAL: No muscle, back pain, joint pain or stiffness.  LYMPHATICS: No enlarged nodes. No history of splenectomy.  PSYCHIATRIC: No history of depression or anxiety.  ENDOCRINOLOGIC: No reports of sweating, cold or heat intolerance. No polyuria or polydipsia.  Marland Kitchen   Physical  Examination Today's Vitals   06/17/20 1141  BP: 103/68  Pulse: 80  SpO2: 98%  Weight: 212 lb 3.2 oz (96.3 kg)  Height: 6' (1.829 m)   Body mass index is 28.78 kg/m.  Gen: resting comfortably, no acute distress HEENT: no scleral icterus, pupils equal round  and reactive, no palptable cervical adenopathy,  CVirreg, no m/rg, no jvd Resp: Clear to auscultation bilaterally GI: abdomen is soft, non-tender, non-distended, normal bowel sounds, no hepatosplenomegaly MSK: extremities are warm, no edema.  Skin: warm, no rash Neuro:  no focal deficits Psych: appropriate affect   Diagnostic Studies Echocardiogram: 07/2018 IMPRESSIONS   1. The left ventricle has mildly reduced systolic function, with an ejection fraction of 45-50%. The cavity size was mild to moderately dilated. There is mildly increased left ventricular wall thickness. Left ventricular diastolic Doppler parameters are consistent with pseudonormalization. Left ventrical global hypokinesis without regional wall motion abnormalities. 2. The right ventricle has normal systolic function. The cavity was normal. There is no increase in right ventricular wall thickness. 3. Left atrial size was severely dilated. 4. The aortic valve is tricuspid. Mild thickening of the aortic valve. Moderate calcification of the aortic valve. Mild stenosis of the aortic valve. 5. The ascending aorta is normal in size and structure. 6. The inferior vena cava was dilated in size with <50% respiratory variability.  09/2019 echo  1. Left ventricular ejection fraction, by estimation, is 30 to 35%. The  left ventricle has moderately decreased function. The left ventricle  demonstrates global hypokinesis with abnormal septal motion possibly  consistent with pacing. The left  ventricular internal cavity size was mildly dilated. Left ventricular  diastolic parameters are indeterminate.  2. Right ventricular systolic function is mildly reduced.  The right  ventricular size is normal. There is mildly elevated pulmonary artery  systolic pressure. The estimated right ventricular systolic pressure is  76.2 mmHg.  3. Left atrial size was moderately dilated.  4. Right atrial size was moderately dilated.  5. The mitral valve is abnormal, mildly thickened and calcified. Moderate  to severe mitral valve regurgitation, eccentric and posteriorly directed.  Not clear that PISA calculations best define degree of regurgitation.  6. The aortic valve has an indeterminant number of cusps and is severely  calcified. Aortic valve mean gradient measures 15.5 mmHg. Cannot exclude  severe low gradient aortic stenosis with dimentionless index of 0.23.  7. The inferior vena cava is dilated in size with <50% respiratory  variability, suggesting right atrial pressure of 15 mmHg.   04/2020 echo IMPRESSIONS    1. Left ventricular ejection fraction, by estimation, is 30 to 35%. The  left ventricle has moderately decreased function. The left ventricle  demonstrates global hypokinesis. The left ventricular internal cavity size  was moderately dilated. Left  ventricular diastolic function could not be evaluated.  2. Right ventricular systolic function is mildly reduced. The right  ventricular size is moderately enlarged. There is moderately elevated  pulmonary artery systolic pressure. The estimated right ventricular  systolic pressure is 83.1 mmHg.  3. Left atrial size was severely dilated.  4. Right atrial size was severely dilated.  5. The mitral valve is grossly structurally normal. Severe mitral valve  regurgitation with a central jet, consistent with sec.  6. Tricuspid valve regurgitation is mild to moderate.  7. Findings suggest severe low flow, low gradient aortic stenosis with  low cardiac output. The aortic valve is functionally bicuspid (fused right  and left cusps). There is moderate calcification of the aortic valve.  There is  severe thickening of the  aortic valve. Aortic valve regurgitation is not visualized.  8. The inferior vena cava is dilated in size with <50% respiratory  variability, suggesting right atrial pressure of 15 mmHg.   04/2020 RHC Findings:  RA = 12 RV =  51/11 PA = 52/15 (29) PCW = 29 ( v = 44) Fick cardiac output/index = 4.5/2.0 Thermo CO/CI = 5.2/2.3 PVR = < 1.0 WU Ao sat = 110% PA sat = 64%, 66%  Assessment: 1. Markedly elevated left sided filling pressures with prominent v-waves in PCWP tracing 2. Moderate reduced cardiac index 3. SBPs in 90s with severe AS  Plan/Discussion:  Will start low-dose dobutamine as tolerated to support BP and facilitate further diuresis prior to TAVR.    Assessment and Plan  Focused visit on recent symptoms of palpitations and chest pain.  1. Chest pain - atypical symptoms, lasting several hours better with lidocaine patch - likely MSK pain, no further cardiac workup  2. Palpitations - primarily at night time, will try increasing his evening coreg to 6.75m, continue AM dose of 3.1226mgiven soft bp's  Continue to follow up with CHF, EP, structural heart clinics  JoArnoldo LenisM.D.

## 2020-06-21 ENCOUNTER — Ambulatory Visit: Payer: Medicare HMO

## 2020-06-21 DIAGNOSIS — I081 Rheumatic disorders of both mitral and tricuspid valves: Secondary | ICD-10-CM | POA: Diagnosis not present

## 2020-06-21 DIAGNOSIS — J849 Interstitial pulmonary disease, unspecified: Secondary | ICD-10-CM | POA: Diagnosis not present

## 2020-06-21 DIAGNOSIS — M1030 Gout due to renal impairment, unspecified site: Secondary | ICD-10-CM | POA: Diagnosis not present

## 2020-06-21 DIAGNOSIS — E1122 Type 2 diabetes mellitus with diabetic chronic kidney disease: Secondary | ICD-10-CM | POA: Diagnosis not present

## 2020-06-21 DIAGNOSIS — Z48812 Encounter for surgical aftercare following surgery on the circulatory system: Secondary | ICD-10-CM | POA: Diagnosis not present

## 2020-06-21 DIAGNOSIS — N184 Chronic kidney disease, stage 4 (severe): Secondary | ICD-10-CM | POA: Diagnosis not present

## 2020-06-21 DIAGNOSIS — I130 Hypertensive heart and chronic kidney disease with heart failure and stage 1 through stage 4 chronic kidney disease, or unspecified chronic kidney disease: Secondary | ICD-10-CM | POA: Diagnosis not present

## 2020-06-21 DIAGNOSIS — I5043 Acute on chronic combined systolic (congestive) and diastolic (congestive) heart failure: Secondary | ICD-10-CM | POA: Diagnosis not present

## 2020-06-21 DIAGNOSIS — I4821 Permanent atrial fibrillation: Secondary | ICD-10-CM | POA: Diagnosis not present

## 2020-06-23 ENCOUNTER — Ambulatory Visit (HOSPITAL_COMMUNITY)
Admission: RE | Admit: 2020-06-23 | Discharge: 2020-06-23 | Disposition: A | Discharge status: Home / Self Care | Payer: Medicare HMO | Source: Ambulatory Visit | Attending: Physician Assistant | Admitting: Physician Assistant

## 2020-06-23 ENCOUNTER — Other Ambulatory Visit: Payer: Self-pay

## 2020-06-23 DIAGNOSIS — Z952 Presence of prosthetic heart valve: Secondary | ICD-10-CM | POA: Insufficient documentation

## 2020-06-23 LAB — ECHOCARDIOGRAM COMPLETE
AR max vel: 1.38 cm2
AV Area VTI: 1.33 cm2
AV Area mean vel: 1.46 cm2
AV Mean grad: 7 mmHg
AV Peak grad: 13.7 mmHg
Ao pk vel: 1.85 m/s
Area-P 1/2: 5.84 cm2
MV M vel: 4.91 m/s
MV Peak grad: 96.4 mmHg
Radius: 0.7 cm
S' Lateral: 4.8 cm

## 2020-06-23 NOTE — Progress Notes (Signed)
*  PRELIMINARY RESULTS* Echocardiogram 2D Echocardiogram has been performed.  John [name] 06/23/2020, 12:37 PM

## 2020-06-27 ENCOUNTER — Ambulatory Visit: Payer: Medicare HMO | Admitting: Nurse Practitioner

## 2020-06-30 ENCOUNTER — Other Ambulatory Visit: Payer: Self-pay

## 2020-06-30 ENCOUNTER — Encounter (HOSPITAL_COMMUNITY)
Admission: RE | Admit: 2020-06-30 | Discharge: 2020-06-30 | Disposition: A | Discharge status: Home / Self Care | Payer: Medicare HMO | Source: Ambulatory Visit | Attending: Internal Medicine | Admitting: Internal Medicine

## 2020-06-30 ENCOUNTER — Encounter (HOSPITAL_COMMUNITY): Payer: Self-pay

## 2020-06-30 DIAGNOSIS — Z952 Presence of prosthetic heart valve: Secondary | ICD-10-CM | POA: Insufficient documentation

## 2020-06-30 DIAGNOSIS — E1122 Type 2 diabetes mellitus with diabetic chronic kidney disease: Secondary | ICD-10-CM | POA: Diagnosis not present

## 2020-06-30 LAB — GLUCOSE, CAPILLARY: Glucose-Capillary: 396 mg/dL — ABNORMAL HIGH (ref 70–99)

## 2020-06-30 NOTE — Progress Notes (Signed)
Incomplete Session Note  Patient Details  Name: John [name] MRN: [id number] Date of Birth: [birth year] Referring Provider:    [name] did not complete his rehab session.  His CBG was 396. Departmental guidelines do not let patients exercise when it is above 300. He will return Monday 4/11 to do his walk test.

## 2020-07-04 ENCOUNTER — Encounter (HOSPITAL_COMMUNITY)
Admission: RE | Admit: 2020-07-04 | Discharge: 2020-07-04 | Disposition: A | Discharge status: Home / Self Care | Payer: Medicare HMO | Source: Ambulatory Visit | Attending: Internal Medicine | Admitting: Internal Medicine

## 2020-07-04 ENCOUNTER — Ambulatory Visit (INDEPENDENT_AMBULATORY_CARE_PROVIDER_SITE_OTHER): Payer: Medicare HMO | Admitting: Gastroenterology

## 2020-07-04 ENCOUNTER — Other Ambulatory Visit: Payer: Self-pay

## 2020-07-04 VITALS — Ht 72.0 in | Wt 211.0 lb

## 2020-07-04 DIAGNOSIS — Z952 Presence of prosthetic heart valve: Secondary | ICD-10-CM | POA: Diagnosis not present

## 2020-07-04 DIAGNOSIS — E1122 Type 2 diabetes mellitus with diabetic chronic kidney disease: Secondary | ICD-10-CM | POA: Diagnosis not present

## 2020-07-04 NOTE — Progress Notes (Signed)
Cardiac Individual Treatment Plan  Patient Details  Name: John [name] MRN: [id number] Date of Birth: May 01, 1945 Referring Provider:   Flowsheet Row CARDIAC REHAB PHASE II EXERCISE from 07/04/2020 in [place]  Referring Provider taylor      Initial Encounter Date:  [facility] PHASE II EXERCISE from 07/04/2020 in [place]  Date 07/04/20      Visit Diagnosis: S/P TAVR (transcatheter aortic valve replacement)  Patient's Home Medications on Admission:  Current Outpatient Medications:  .  ACCU-CHEK AVIVA PLUS test strip, TEST BLOOD SUGAR TWICE DAILY BEFORE BREAKFAST AND AT BEDTIME (Patient taking differently: 1 each by Other route 2 (two) times daily.), Disp: 200 strip, Rfl: 1 .  acetaminophen (TYLENOL) 325 MG tablet, Take 2 tablets (650 mg total) by mouth every 4 (four) hours as needed for headache or mild pain., Disp: , Rfl:  .  allopurinol (ZYLOPRIM) 300 MG tablet, Take 0.5 tablets (150 mg total) by mouth daily., Disp: 30 tablet, Rfl: 6 .  apixaban (ELIQUIS) 5 MG TABS tablet, Take 1 tablet (5 mg total) by mouth 2 (two) times daily., Disp: 180 tablet, Rfl: 3 .  aspirin 81 MG chewable tablet, Chew 1 tablet (81 mg total) by mouth daily., Disp: , Rfl:  .  benzonatate (TESSALON) 100 MG capsule, Take 1 capsule (100 mg total) by mouth 3 (three) times daily as needed for cough., Disp: 90 capsule, Rfl: 3 .  carvedilol (COREG) 3.125 MG tablet, TAKE 1 TABLET IN THE MORNING AND 2 TABLETS IN THE EVENING, Disp: 270 tablet, Rfl: 1 .  colchicine 0.6 MG tablet, Take 1 tablet (0.6 mg total) by mouth daily as needed. (Patient taking differently: Take 0.6 mg by mouth daily as needed (gout).), Disp: 30 tablet, Rfl: 6 .  dapagliflozin propanediol (FARXIGA) 10 MG TABS tablet, Take 1 tablet (10 mg total) by mouth daily before breakfast., Disp: 30 tablet, Rfl: 3 .  diphenoxylate-atropine (LOMOTIL) 2.5-0.025 MG tablet, Take 1 tablet by mouth 4  (four) times daily as needed for diarrhea or loose stools., Disp: , Rfl:  .  HYDROcodone-acetaminophen (NORCO/VICODIN) 5-325 MG tablet, Take one tablet po every 4 hrs prn pain (Patient taking differently: Take 1 tablet by mouth every 4 (four) hours as needed (for pain).), Disp: 28 tablet, Rfl: 0 .  Insulin Pen Needle (BD PEN NEEDLE NANO U/F) 32G X 4 MM MISC, 1 each by Does not apply route 4 (four) times daily. (Patient taking differently: 1 each by Does not apply route as directed.), Disp: 150 each, Rfl: 5 .  Multiple Vitamins-Minerals (ONE-A-DAY MENS 50+) TABS, Take 1 tablet by mouth daily with breakfast., Disp: , Rfl:  .  potassium chloride SA (KLOR-CON) 20 MEQ tablet, Take 1.5 tablets (30 mEq total) by mouth 2 (two) times daily., Disp: 90 tablet, Rfl: 6 .  spironolactone (ALDACTONE) 25 MG tablet, Take 0.5 tablets (12.5 mg total) by mouth daily., Disp: 15 tablet, Rfl: 6 .  torsemide (DEMADEX) 20 MG tablet, Take 2 tablets (40 mg total) by mouth 2 (two) times daily., Disp: , Rfl:  .  TRULICITY 1.5 NF/6.2ZH SOPN, INJECT 1.5MG (1 PEN) SUBCUTANEOUSLY EVERY WEEK (Patient taking differently: Inject 1.5 mg into the skin every Sunday.), Disp: 2 mL, Rfl: 2  Past Medical History: Past Medical History:  Diagnosis Date  . Atrial flutter ([place]) 12/2010   Admitted with symptomatic bradycardia (HR 40s), atrial flutter with slow ventricular response 12/2010 + volume overload; AV nodal agents d/c'd and Pradaxa started; RFA  in 01/2011  . CHF (congestive heart failure) ([place])   . Chronic combined systolic and diastolic heart failure ([place])    a. echo 01/06/11: mild LVH, EF 65-70%, mod to severe LAE, mild RVE, mild RAE, PASP 32;   TEE 10/12: EF 45-50% b. EF 45 to 50% by echo in 07/2018  . Class 2 severe obesity due to excess calories with serious comorbidity and body mass index (BMI) of 37.0 to 37.9 in adult ([place]) 07/17/2017  . Diabetes mellitus    non insulin dependant  . Hematoma, chest wall 05/15/2020  .  Hyperlipidemia   . Hypertension   . Osteoarthritis   . 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 Presence of permanent cardiac pacemaker   . PSVT (paroxysmal supraventricular tachycardia) ([facility])    Possibly atrial flutter  . S/P TAVR (transcatheter aortic valve replacement) 05/03/2020   s/p TAVR with a 29 mm Edwards S3U via the subclavian approach with Dr. Angelena Form & Dr. Cyndia Bent   . Severe aortic stenosis     Tobacco Use: Social History   Tobacco Use  Smoking Status Former Smoker  . Packs/day: 1.00  . Years: 10.00  . Pack years: 10.00  . Types: Cigarettes  . Quit date: 03/26/1986  . Years since quitting: 34.2  Smokeless Tobacco Never Used  Tobacco Comment   "stopped cigarette  smoking 1988"    Labs: Recent Review Flowsheet Data    Labs for ITP Cardiac and Pulmonary Rehab Latest Ref Rng & Units 05/11/2020 05/12/2020 05/13/2020 05/14/2020 05/15/2020   Cholestrol 0 - 200 mg/dL - - - - -   LDLCALC 0 - 99 mg/dL - - - - -   HDL >40 mg/dL - - - - -   Trlycerides <150 mg/dL - - - - -   Hemoglobin A1c 4.8 - 5.6 % - - - - -   PHART 7.350 - 7.450 - - - - -   PCO2ART 32.0 - 48.0 mmHg - - - - -   HCO3 20.0 - 28.0 mmol/L - - - - -   TCO2 22 - 32 mmol/L - - - - -   O2SAT % 69.2 67.9 60.2 73.3 65.9      Capillary Blood Glucose: Lab Results  Component Value Date   GLUCAP 396 (H) 06/30/2020   GLUCAP 179 (H) 06/08/2020   GLUCAP 200 (H) 05/15/2020   GLUCAP 253 (H) 05/15/2020   GLUCAP 136 (H) 05/15/2020    POCT Glucose    Row Name 06/30/20 1311 07/04/20 1416           POCT Blood Glucose   Pre-Exercise 396 mg/dL --      Pre-Exercise #2 -- 158 mg/dL             Exercise Target Goals: Exercise Program Goal: Individual exercise prescription set using results from initial 6 min walk test and THRR while considering  patient's activity barriers and safety.   Exercise Prescription Goal: Starting with aerobic activity 30 plus minutes a day, 3 days per week for initial exercise prescription.  Provide home exercise prescription and guidelines that participant acknowledges understanding prior to discharge.  Activity Barriers & Risk Stratification:  Activity Barriers & Cardiac Risk Stratification - 06/30/20 1240      Activity Barriers & Cardiac Risk Stratification   Activity Barriers Arthritis;Deconditioning;Decreased Ventricular Function    Cardiac Risk Stratification High           6 Minute Walk:  6 Minute Walk    Row Name 07/04/20 1442  6 Minute Walk   Phase Initial     Distance 600 feet     Walk Time 6 minutes     # of Rest Breaks 2     MPH 1.1     METS 1.28     RPE 12     VO2 Peak 4.49     Symptoms Yes (comment)     Comments left knee pain 3/10     Resting HR 66 bpm     Resting BP 108/56     Resting Oxygen Saturation  98 %     Exercise Oxygen Saturation  during 6 min walk 98 %     Max Ex. HR 87 bpm     Max Ex. BP 122/54     2 Minute Post BP 118/60            Oxygen Initial Assessment:   Oxygen Re-Evaluation:   Oxygen Discharge (Final Oxygen Re-Evaluation):   Initial Exercise Prescription:  Initial Exercise Prescription - 07/04/20 1400      Date of Initial Exercise RX and Referring Provider   Date 07/04/20    Referring Provider taylor    Expected Discharge Date 09/21/20      NuStep   Level 1    SPM 60    Minutes 22      Arm Ergometer   Level 1    RPM 40    Minutes 17      Prescription Details   Frequency (times per week) 3    Duration Progress to 30 minutes of continuous aerobic without signs/symptoms of physical distress      Intensity   THRR 40-80% of Max Heartrate 58-117    Ratings of Perceived Exertion 11-13      Progression   Progression Continue to progress workloads to maintain intensity without signs/symptoms of physical distress.      Resistance Training   Training Prescription Yes    Weight 3    Reps 10-15           Perform Capillary Blood Glucose checks as needed.  Exercise Prescription  Changes:   Exercise Comments:   Exercise Goals and Review:  Exercise Goals    Row Name 07/04/20 1448             Exercise Goals   Increase Physical Activity Yes       Intervention Develop an individualized exercise prescription for aerobic and resistive training based on initial evaluation findings, risk stratification, comorbidities and participant's personal goals.;Provide advice, education, support and counseling about physical activity/exercise needs.       Expected Outcomes Short Term: Attend rehab on a regular basis to increase amount of physical activity.;Long Term: Add in home exercise to make exercise part of routine and to increase amount of physical activity.;Long Term: Exercising regularly at least 3-5 days a week.       Increase Strength and Stamina Yes       Intervention Provide advice, education, support and counseling about physical activity/exercise needs.;Develop an individualized exercise prescription for aerobic and resistive training based on initial evaluation findings, risk stratification, comorbidities and participant's personal goals.       Expected Outcomes Short Term: Increase workloads from initial exercise prescription for resistance, speed, and METs.;Short Term: Perform resistance training exercises routinely during rehab and add in resistance training at home;Long Term: Improve cardiorespiratory fitness, muscular endurance and strength as measured by increased METs and functional capacity (6MWT)       Able to understand  and use rate of perceived exertion (RPE) scale Yes       Intervention Provide education and explanation on how to use RPE scale       Expected Outcomes Short Term: Able to use RPE daily in rehab to express subjective intensity level;Long Term:  Able to use RPE to guide intensity level when exercising independently       Knowledge and understanding of Target Heart Rate Range (THRR) Yes       Intervention Provide education and explanation of THRR  including how the numbers were predicted and where they are located for reference       Expected Outcomes Short Term: Able to state/look up THRR;Long Term: Able to use THRR to govern intensity when exercising independently;Short Term: Able to use daily as guideline for intensity in rehab       Able to check pulse independently Yes       Intervention Provide education and demonstration on how to check pulse in carotid and radial arteries.;Review the importance of being able to check your own pulse for safety during independent exercise       Expected Outcomes Short Term: Able to explain why pulse checking is important during independent exercise;Long Term: Able to check pulse independently and accurately       Understanding of Exercise Prescription Yes       Intervention Provide education, explanation, and written materials on patient's individual exercise prescription       Expected Outcomes Long Term: Able to explain home exercise prescription to exercise independently;Short Term: Able to explain program exercise prescription              Exercise Goals Re-Evaluation :    Discharge Exercise Prescription (Final Exercise Prescription Changes):   Nutrition:  Target Goals: Understanding of nutrition guidelines, daily intake of sodium <1575m, cholesterol <2062m calories 30% from fat and 7% or less from saturated fats, daily to have 5 or more servings of fruits and vegetables.  Biometrics:  Pre Biometrics - 07/04/20 1447      Pre Biometrics   Height 6' (1.829 m)    Weight 210 lb 15.7 oz (95.7 kg)    Waist Circumference 43 inches    Hip Circumference 42 inches    Waist to Hip Ratio 1.02 %    BMI (Calculated) 28.61    Triceps Skinfold 21 mm    % Body Fat 40.1 %    Grip Strength 17.8 kg    Flexibility 0 in    Single Leg Stand 34 seconds            Nutrition Therapy Plan and Nutrition Goals:   Nutrition Assessments:  Nutrition Assessments - 06/30/20 1243      MEDFICTS  Scores   Pre Score 23          MEDIFICTS Score Key:  ?70 Need to make dietary changes   40-70 Heart Healthy Diet  ? 40 Therapeutic Level Cholesterol Diet   Picture Your Plate Scores:  <4<37nhealthy dietary pattern with much room for improvement.  41-50 Dietary pattern unlikely to meet recommendations for good health and room for improvement.  51-60 More healthful dietary pattern, with some room for improvement.   >60 Healthy dietary pattern, although there may be some specific behaviors that could be improved.    Nutrition Goals Re-Evaluation:   Nutrition Goals Discharge (Final Nutrition Goals Re-Evaluation):   Psychosocial: Target Goals: Acknowledge presence or absence of significant depression and/or stress, maximize coping skills, provide positive  support system. Participant is able to verbalize types and ability to use techniques and skills needed for reducing stress and depression.  Initial Review & Psychosocial Screening:  Initial Psych Review & Screening - 06/30/20 1241      Initial Review   Current issues with None Identified      Family Dynamics   Good Support System? Yes    Comments His wife is his support system.      Barriers   Psychosocial barriers to participate in program There are no identifiable barriers or psychosocial needs.      Screening Interventions   Interventions Encouraged to exercise    Expected Outcomes Long Term Goal: Stressors or current issues are controlled or eliminated.;Long Term goal: The participant improves quality of Life and PHQ9 Scores as seen by post scores and/or verbalization of changes           Quality of Life Scores:  Quality of Life - 07/04/20 1448      Quality of Life   Select Quality of Life      Quality of Life Scores   Health/Function Pre 16.9 %    Socioeconomic Pre 27.58 %    Psych/Spiritual Pre 26.5 %    Family Pre 13.5 %    GLOBAL Pre 20.58 %          Scores of 19 and below usually indicate a  poorer quality of life in these areas.  A difference of  2-3 points is a clinically meaningful difference.  A difference of 2-3 points in the total score of the Quality of Life Index has been associated with significant improvement in overall quality of life, self-image, physical symptoms, and general health in studies assessing change in quality of life.  PHQ-9: Recent Review Flowsheet Data    Depression screen [facility] 2/9 06/30/2020 04/15/2020 01/23/2019 11/24/2018 11/06/2018   Decreased Interest 0 0 0 0 0   Down, Depressed, Hopeless 0 0 0 0 0   PHQ - 2 Score 0 0 0 0 0   Altered sleeping 0 - - - -   Tired, decreased energy 1 - - - -   Change in appetite 0 - - - -   Feeling bad or failure about yourself  0 - - - -   Trouble concentrating 1 - - - -   Moving slowly or fidgety/restless 1 - - - -   Suicidal thoughts 0 - - - -   PHQ-9 Score 3 - - - -   Difficult doing work/chores Somewhat difficult - - - -     Interpretation of Total Score  Total Score Depression Severity:  1-4 = Minimal depression, 5-9 = Mild depression, 10-14 = Moderate depression, 15-19 = Moderately severe depression, 20-27 = Severe depression   Psychosocial Evaluation and Intervention:  Psychosocial Evaluation - 06/30/20 1306      Psychosocial Evaluation & Interventions   Interventions Encouraged to exercise with the program and follow exercise prescription    Comments Pt has no identifiable barriers to completing rehab. He has no identifiable psychosocial issues, and his PHQ-9 score is a 3. He was not very talkative, so it was hard to pull information from him. He states that his wife is his support system, and did not list anyone else, nor go into any detail about this. He states that his goal in the program is to get stronger. He was unable to do the physical portion of his orientation due to his blood sugar being 396.  He states that this is uncommon.    Expected Outcomes The patient will continue to not have any identifiable  psychosocial issues.    Continue Psychosocial Services  No Follow up required           Psychosocial Re-Evaluation:   Psychosocial Discharge (Final Psychosocial Re-Evaluation):   Vocational Rehabilitation: Provide vocational rehab assistance to qualifying candidates.   Vocational Rehab Evaluation & Intervention:  Vocational Rehab - 06/30/20 1243      Initial Vocational Rehab Evaluation & Intervention   Assessment shows need for Vocational Rehabilitation No      Vocational Rehab Re-Evaulation   Comments He is retired and has no desire to return to work           Education: Education Goals: Education classes will be provided on a weekly basis, covering required topics. Participant will state understanding/return demonstration of topics presented.  Learning Barriers/Preferences:  Learning Barriers/Preferences - 06/30/20 1247      Learning Barriers/Preferences   Learning Barriers None    Learning Preferences Audio;Video;Written Material;Skilled Demonstration           Education Topics: Hypertension, Hypertension Reduction -Define heart disease and high blood pressure. Discus how high blood pressure affects the body and ways to reduce high blood pressure.   Exercise and Your Heart -Discuss why it is important to exercise, the FITT principles of exercise, normal and abnormal responses to exercise, and how to exercise safely.   Angina -Discuss definition of angina, causes of angina, treatment of angina, and how to decrease risk of having angina.   Cardiac Medications -Review what the following cardiac medications are used for, how they affect the body, and side effects that may occur when taking the medications.  Medications include Aspirin, Beta blockers, calcium channel blockers, ACE Inhibitors, angiotensin receptor blockers, diuretics, digoxin, and antihyperlipidemics.   Congestive Heart Failure -Discuss the definition of CHF, how to live with CHF, the signs  and symptoms of CHF, and how keep track of weight and sodium intake.   Heart Disease and Intimacy -Discus the effect sexual activity has on the heart, how changes occur during intimacy as we age, and safety during sexual activity.   Smoking Cessation / COPD -Discuss different methods to quit smoking, the health benefits of quitting smoking, and the definition of COPD.   Nutrition I: Fats -Discuss the types of cholesterol, what cholesterol does to the heart, and how cholesterol levels can be controlled.   Nutrition II: Labels -Discuss the different components of food labels and how to read food label   Heart Parts/Heart Disease and PAD -Discuss the anatomy of the heart, the pathway of blood circulation through the heart, and these are affected by heart disease.   Stress I: Signs and Symptoms -Discuss the causes of stress, how stress may lead to anxiety and depression, and ways to limit stress.   Stress II: Relaxation -Discuss different types of relaxation techniques to limit stress.   Warning Signs of Stroke / TIA -Discuss definition of a stroke, what the signs and symptoms are of a stroke, and how to identify when someone is having stroke.   Knowledge Questionnaire Score:  Knowledge Questionnaire Score - 06/30/20 1247      Knowledge Questionnaire Score   Pre Score 15/24           Core Components/Risk Factors/Patient Goals at Admission:  Personal Goals and Risk Factors at Admission - 06/30/20 1250      Core Components/Risk Factors/Patient Goals on Admission  Improve shortness of breath with ADL's Yes    Intervention Provide education, individualized exercise plan and daily activity instruction to help decrease symptoms of SOB with activities of daily living.    Expected Outcomes Short Term: Improve cardiorespiratory fitness to achieve a reduction of symptoms when performing ADLs;Long Term: Be able to perform more ADLs without symptoms or delay the onset of symptoms     Diabetes Yes    Intervention Provide education about signs/symptoms and action to take for hypo/hyperglycemia.;Provide education about proper nutrition, including hydration, and aerobic/resistive exercise prescription along with prescribed medications to achieve blood glucose in normal ranges: Fasting glucose 65-99 mg/dL    Expected Outcomes Short Term: Participant verbalizes understanding of the signs/symptoms and immediate care of hyper/hypoglycemia, proper foot care and importance of medication, aerobic/resistive exercise and nutrition plan for blood glucose control.;Long Term: Attainment of HbA1C < 7%.    Heart Failure Yes    Intervention Provide a combined exercise and nutrition program that is supplemented with education, support and counseling about heart failure. Directed toward relieving symptoms such as shortness of breath, decreased exercise tolerance, and extremity edema.    Expected Outcomes Improve functional capacity of life;Short term: Attendance in program 2-3 days a week with increased exercise capacity. Reported lower sodium intake. Reported increased fruit and vegetable intake. Reports medication compliance.;Short term: Daily weights obtained and reported for increase. Utilizing diuretic protocols set by physician.;Long term: Adoption of self-care skills and reduction of barriers for early signs and symptoms recognition and intervention leading to self-care maintenance.           Core Components/Risk Factors/Patient Goals Review:    Core Components/Risk Factors/Patient Goals at Discharge (Final Review):    ITP Comments:   Comments: Patient arrived for his walk test at 1400. He had previously completed the orientation portion, but was unable to do his walk test due to his CBG>300. Patient was referred to CR by Dr. Haroldine Laws due to S/P TAVR (Z95.2). During orientation advised patient on arrival and appointment times what to wear, what to do before, during and after exercise.  Reviewed attendance and class policy.  Pt is scheduled to return Cardiac Rehab on 07/06/2020 at 1445. Pt was advised to come to class 15 minutes before class starts.  Discussed RPE/Dpysnea scales. Patient participated in warm up stretches. Patient was able to complete 6 minute walk test.  Telemetry: ventricular pacing with RBBB, abnormal. Telemetry abnormal, but matched previous EKG recordings in patient's chart. Patient was measured for the equipment. Discussed equipment safety with patient. Took patient pre-anthropometric measurements. Patient finished visit at 1440.

## 2020-07-06 ENCOUNTER — Other Ambulatory Visit: Payer: Self-pay

## 2020-07-06 ENCOUNTER — Encounter (HOSPITAL_COMMUNITY)
Admission: RE | Admit: 2020-07-06 | Discharge: 2020-07-06 | Disposition: A | Discharge status: Home / Self Care | Payer: Medicare HMO | Source: Ambulatory Visit | Attending: Internal Medicine | Admitting: Internal Medicine

## 2020-07-06 DIAGNOSIS — Z952 Presence of prosthetic heart valve: Secondary | ICD-10-CM | POA: Diagnosis not present

## 2020-07-06 DIAGNOSIS — E1122 Type 2 diabetes mellitus with diabetic chronic kidney disease: Secondary | ICD-10-CM | POA: Diagnosis not present

## 2020-07-06 NOTE — Progress Notes (Signed)
Daily Session Note  Patient Details  Name: John [name] MRN: [id number] Date of Birth: 02/26/1946 Referring Provider:   Flowsheet Row CARDIAC REHAB PHASE II EXERCISE from 07/04/2020 in [place]  Referring Provider taylor      Encounter Date: 07/06/2020  Check In:  Session Check In - 07/06/20 1445      Check-In   Supervising physician immediately available to respond to emergencies [facility] MD immediately available    Physician(s) Dr. Johnsie Cancel    Location [facility]    Staff Present Cathren Harsh, MS, Exercise Physiologist;Dalton Kris Mouton, MS, ACSM-CEP, Exercise Physiologist    Virtual Visit No    Medication changes reported     No    Fall or balance concerns reported    No    Tobacco Cessation No Change    Warm-up and Cool-down Performed as group-led instruction    Resistance Training Performed Yes    VAD Patient? No    PAD/SET Patient? No      Pain Assessment   Currently in Pain? No/denies    Multiple Pain Sites No           Capillary Blood Glucose: No results found for this or any previous visit (from the past 24 hour(s)).    Social History   Tobacco Use  Smoking Status Former Smoker  . Packs/day: 1.00  . Years: 10.00  . Pack years: 10.00  . Types: Cigarettes  . Quit date: 03/26/1986  . Years since quitting: 34.3  Smokeless Tobacco Never Used  Tobacco Comment   "stopped cigarette  smoking 1988"    Goals Met:  Independence with exercise equipment Exercise tolerated well No report of cardiac concerns or symptoms [name] training completed today  Goals Unmet:  Not Applicable  Comments: check out 1545   Dr. Kathie Dike is Medical Director for [facility].

## 2020-07-08 ENCOUNTER — Other Ambulatory Visit: Payer: Self-pay

## 2020-07-08 ENCOUNTER — Encounter (HOSPITAL_COMMUNITY)
Admission: RE | Admit: 2020-07-08 | Discharge: 2020-07-08 | Disposition: A | Discharge status: Home / Self Care | Payer: Medicare HMO | Source: Ambulatory Visit | Attending: Internal Medicine | Admitting: Internal Medicine

## 2020-07-08 DIAGNOSIS — Z952 Presence of prosthetic heart valve: Secondary | ICD-10-CM

## 2020-07-08 DIAGNOSIS — E1122 Type 2 diabetes mellitus with diabetic chronic kidney disease: Secondary | ICD-10-CM | POA: Diagnosis not present

## 2020-07-08 NOTE — Progress Notes (Signed)
Daily Session Note  Patient Details  Name: John [name] MRN: [id number] Date of Birth: 02-16-1946 Referring Provider:   Flowsheet Row CARDIAC REHAB PHASE II EXERCISE from 07/04/2020 in [place]  Referring Provider taylor      Encounter Date: 07/08/2020  Check In:  Session Check In - 07/08/20 1445      Check-In   Supervising physician immediately available to respond to emergencies [facility] MD immediately available    Physician(s) Dr. Harrington Challenger    Location [facility]    Staff Present Cathren Harsh, MS, Exercise Physiologist;Dalton Kris Mouton, MS, ACSM-CEP, Exercise Physiologist    Virtual Visit No    Medication changes reported     No    Fall or balance concerns reported    No    Tobacco Cessation No Change    Warm-up and Cool-down Performed as group-led instruction    Resistance Training Performed Yes    VAD Patient? No    PAD/SET Patient? No      Pain Assessment   Currently in Pain? No/denies    Multiple Pain Sites No           Capillary Blood Glucose: No results found for this or any previous visit (from the past 24 hour(s)).    Social History   Tobacco Use  Smoking Status Former Smoker  . Packs/day: 1.00  . Years: 10.00  . Pack years: 10.00  . Types: Cigarettes  . Quit date: 03/26/1986  . Years since quitting: 34.3  Smokeless Tobacco Never Used  Tobacco Comment   "stopped cigarette  smoking 1988"    Goals Met:  Independence with exercise equipment Exercise tolerated well No report of cardiac concerns or symptoms Strength training completed today  Goals Unmet:  Not Applicable  Comments: check out 1545   Dr. Kathie Dike is Medical Director for [facility].

## 2020-07-11 ENCOUNTER — Other Ambulatory Visit: Payer: Self-pay

## 2020-07-11 ENCOUNTER — Encounter (HOSPITAL_COMMUNITY)
Admission: RE | Admit: 2020-07-11 | Discharge: 2020-07-11 | Disposition: A | Discharge status: Home / Self Care | Payer: Medicare HMO | Source: Ambulatory Visit | Attending: Internal Medicine | Admitting: Internal Medicine

## 2020-07-11 VITALS — Wt 212.7 lb

## 2020-07-11 DIAGNOSIS — E1122 Type 2 diabetes mellitus with diabetic chronic kidney disease: Secondary | ICD-10-CM | POA: Diagnosis not present

## 2020-07-11 DIAGNOSIS — Z952 Presence of prosthetic heart valve: Secondary | ICD-10-CM | POA: Diagnosis not present

## 2020-07-11 NOTE — Progress Notes (Signed)
Daily Session Note  Patient Details  Name: John [name] MRN: [id number] Date of Birth: [birth year] Referring Provider:   Flowsheet Row CARDIAC REHAB PHASE II EXERCISE from 07/04/2020 in [place]  Referring Provider taylor      Encounter Date: 07/11/2020  Check In:  Session Check In - 07/11/20 1445      Check-In   Supervising physician immediately available to respond to emergencies [facility] MD immediately available    Physician(s) Dr. Harl Bowie    Location [facility]    Staff Present Cathren Harsh, MS, Exercise Physiologist;Dalton Kris Mouton, MS, ACSM-CEP, Exercise Physiologist    Virtual Visit No    Medication changes reported     No    Fall or balance concerns reported    No    Tobacco Cessation No Change    Warm-up and Cool-down Performed as group-led instruction    Resistance Training Performed Yes    VAD Patient? No    PAD/SET Patient? No      Pain Assessment   Currently in Pain? No/denies    Multiple Pain Sites No           Capillary Blood Glucose: No results found for this or any previous visit (from the past 24 hour(s)).    Social History   Tobacco Use  Smoking Status Former Smoker  . Packs/day: 1.00  . Years: 10.00  . Pack years: 10.00  . Types: Cigarettes  . Quit date: 03/26/1986  . Years since quitting: 34.3  Smokeless Tobacco Never Used  Tobacco Comment   "stopped cigarette  smoking 1988"    Goals Met:  Independence with exercise equipment Exercise tolerated well No report of cardiac concerns or symptoms Strength training completed today  Goals Unmet:  Not Applicable  Comments: check out 1545   Dr. Kathie Dike is Medical Director for [facility] Pul[facility].

## 2020-07-12 ENCOUNTER — Encounter: Payer: Self-pay | Admitting: Internal Medicine

## 2020-07-12 ENCOUNTER — Ambulatory Visit (INDEPENDENT_AMBULATORY_CARE_PROVIDER_SITE_OTHER): Payer: Medicare HMO | Admitting: Internal Medicine

## 2020-07-12 ENCOUNTER — Other Ambulatory Visit (HOSPITAL_COMMUNITY): Payer: Medicare HMO

## 2020-07-12 VITALS — BP 118/64 | HR 72 | Ht 72.0 in | Wt 215.0 lb

## 2020-07-12 DIAGNOSIS — Z950 Presence of cardiac pacemaker: Secondary | ICD-10-CM | POA: Diagnosis not present

## 2020-07-12 DIAGNOSIS — I5022 Chronic systolic (congestive) heart failure: Secondary | ICD-10-CM

## 2020-07-12 NOTE — Progress Notes (Signed)
HPI John [name] returns today for followup. Her has chronic atrial fib chronic systolic heart failure and CHB. He developed worsening AS and underwent TAVR several months ago. We then tried to upgrade him to a biv device but found an occluded left subclavian vein. He is participating in cardiac rehab. He feels like his symptoms are much improved. No chest pain. He has class 2 dyspnea.   No Known Allergies   Current Outpatient Medications  Medication Sig Dispense Refill  . acetaminophen (TYLENOL) 325 MG tablet Take 2 tablets (650 mg total) by mouth every 4 (four) hours as needed for headache or mild pain.    Marland Kitchen allopurinol (ZYLOPRIM) 300 MG tablet Take 0.5 tablets (150 mg total) by mouth daily. (Patient taking differently: Take 150 mg by mouth daily as needed.) 30 tablet 6  . apixaban (ELIQUIS) 5 MG TABS tablet Take 1 tablet (5 mg total) by mouth 2 (two) times daily. 180 tablet 3  . aspirin 81 MG chewable tablet Chew 1 tablet (81 mg total) by mouth daily.    . benzonatate (TESSALON) 100 MG capsule Take 1 capsule (100 mg total) by mouth 3 (three) times daily as needed for cough. 90 capsule 3  . carvedilol (COREG) 3.125 MG tablet TAKE 1 TABLET IN THE MORNING AND 2 TABLETS IN THE EVENING 270 tablet 1  . dapagliflozin propanediol (FARXIGA) 10 MG TABS tablet Take 1 tablet (10 mg total) by mouth daily before breakfast. 30 tablet 3  . diphenoxylate-atropine (LOMOTIL) 2.5-0.025 MG tablet Take 1 tablet by mouth 4 (four) times daily as needed for diarrhea or loose stools.    . Multiple Vitamins-Minerals (ONE-A-DAY MENS 50+) TABS Take 1 tablet by mouth daily with breakfast.    . potassium chloride SA (KLOR-CON) 20 MEQ tablet Take 1.5 tablets (30 mEq total) by mouth 2 (two) times daily. 90 tablet 6  . spironolactone (ALDACTONE) 25 MG tablet Take 0.5 tablets (12.5 mg total) by mouth daily. 15 tablet 6  . torsemide (DEMADEX) 20 MG tablet Take 2 tablets (40 mg total) by mouth 2 (two) times daily.    .  TRULICITY 1.5 UU/8.2CM SOPN INJECT 1.5MG (1 PEN) SUBCUTANEOUSLY EVERY WEEK (Patient taking differently: Inject 1.5 mg into the skin every Sunday.) 2 mL 2  . ACCU-CHEK AVIVA PLUS test strip TEST BLOOD SUGAR TWICE DAILY BEFORE BREAKFAST AND AT BEDTIME (Patient taking differently: 1 each by Other route 2 (two) times daily.) 200 strip 1  . colchicine 0.6 MG tablet Take 1 tablet (0.6 mg total) by mouth daily as needed. (Patient not taking: Reported on 07/12/2020) 30 tablet 6  . Insulin Pen Needle (BD PEN NEEDLE NANO U/F) 32G X 4 MM MISC 1 each by Does not apply route 4 (four) times daily. (Patient taking differently: 1 each by Does not apply route as directed.) 150 each 5   No current facility-administered medications for this visit.     Past Medical History:  Diagnosis Date  . Atrial flutter ([place]) 12/2010   Admitted with symptomatic bradycardia (HR 40s), atrial flutter with slow ventricular response 12/2010 + volume overload; AV nodal agents d/c'd and Pradaxa started; RFA in 01/2011  . CHF (congestive heart failure) ([place])   . Chronic combined systolic and diastolic heart failure ([place])    a. echo 01/06/11: mild LVH, EF 65-70%, mod to severe LAE, mild RVE, mild RAE, PASP 32;   TEE 10/12: EF 45-50% b. EF 45 to 50% by echo in 07/2018  . Class 2  severe obesity due to excess calories with serious comorbidity and body mass index (BMI) of 37.0 to 37.9 in adult [facility]) 07/17/2017  . Diabetes mellitus    non insulin dependant  . Hematoma, chest wall 05/15/2020  . Hyperlipidemia   . Hypertension   . Osteoarthritis   . 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 Presence of permanent cardiac pacemaker   . PSVT (paroxysmal supraventricular tachycardia) ([facility])    Possibly atrial flutter  . S/P TAVR (transcatheter aortic valve replacement) 05/03/2020   s/p TAVR with a 29 mm Edwards S3U via the subclavian approach with Dr. Angelena Form & Dr. Cyndia Bent   . Severe aortic stenosis     ROS:   All systems reviewed and negative  except as noted in the HPI.   Past Surgical History:  Procedure Laterality Date  . ATRIAL FLUTTER ABLATION N/A 02/07/2011   Procedure: ATRIAL FLUTTER ABLATION;  Surgeon: Evans Lance, MD;  Location: [facility] CATH LAB;  Service: Cardiovascular;  Laterality: N/A;  . BIV UPGRADE N/A 06/08/2020   Procedure: BIV PPM UPGRADE;  Surgeon: Evans Lance, MD;  Location: [place] CV LAB;  Service: Cardiovascular;  Laterality: N/A;  . CARDIAC ELECTROPHYSIOLOGY STUDY AND ABLATION  02/07/11  . CARDIOVERSION N/A 03/23/2016   Procedure: CARDIOVERSION;  Surgeon: Evans Lance, MD;  Location: [place];  Service: Cardiovascular;  Laterality: N/A;  . COLONOSCOPY N/A 04/17/2017   Procedure: COLONOSCOPY;  Surgeon: Rogene Houston, MD;  Location: [facility];  Service: Endoscopy;  Laterality: N/A;  830  . EP IMPLANTABLE DEVICE N/A 08/29/2015   Procedure: Pacemaker Implant;  Surgeon: Evans Lance, MD;  Location: [place] CV LAB;  Service: Cardiovascular;  Laterality: N/A;  . EP IMPLANTABLE DEVICE N/A 12/07/2015   Procedure: PPM Lead Revision/Repair;  Surgeon: Evans Lance, MD;  Location: [place] CV LAB;  Service: Cardiovascular;  Laterality: N/A;  . KNEE ARTHROSCOPY  2005   left  . LUMBAR SPINE SURGERY     "I've had 6 ORs 1972 thru 2004"  . POLYPECTOMY  04/17/2017   Procedure: POLYPECTOMY;  Surgeon: Rogene Houston, MD;  Location[facility];  Service: Endoscopy;;  transverse colon x3;  . RIGHT HEART CATH N/A 04/28/2020   Procedure: RIGHT HEART CATH;  Surgeon: Jolaine Artist, MD;  Location: [place] CV LAB;  Service: Cardiovascular;  Laterality: N/A;  . RIGHT/LEFT HEART CATH AND CORONARY ANGIOGRAPHY N/A 12/21/2019   Procedure: RIGHT/LEFT HEART CATH AND CORONARY ANGIOGRAPHY;  Surgeon: Nelva Bush, MD;  Location: [place] CV LAB;  Service: Cardiovascular;  Laterality: N/A;  . TEE WITHOUT CARDIOVERSION N/A 11/05/2019   Procedure: TRANSESOPHAGEAL ECHOCARDIOGRAM (TEE) WITH PROPOFOL;   Surgeon: Arnoldo Lenis, MD;  [facility];  Service: Endoscopy;  Laterality: N/A;  . TEE WITHOUT CARDIOVERSION N/A 05/03/2020   Procedure: TRANSESOPHAGEAL ECHOCARDIOGRAM (TEE);  Surgeon: Burnell Blanks, MD;  Location: [place];  Service: Open Heart Surgery;  Laterality: N/A;     Family History  Problem Relation Age of Onset  . Heart attack Mother   . Hypertension Mother   . Heart attack Father   . Hypertension Father   . Heart attack Brother   . Colon cancer Neg Hx      Social History   Socioeconomic History  . Marital status: Married    Spouse name: Not on file  . Number of children: 2  . Years of education: Not on file  . Highest education level: Not on file  Occupational History  .  Occupation: Immunologist: RETIRED  Tobacco Use  . Smoking status: Former Smoker    Packs/day: 1.00    Years: 10.00    Pack years: 10.00    Types: Cigarettes    Quit date: 03/26/1986    Years since quitting: 34.3  . Smokeless tobacco: Never Used  . Tobacco comment: "stopped cigarette  smoking 1988"  Vaping Use  . Vaping Use: Never used  Substance and Sexual Activity  . Alcohol use: Not Currently    Alcohol/week: 0.0 standard drinks    Comment: "quit alcohol ~ 2007"  . Drug use: No  . Sexual activity: Yes    Partners: Female  Other Topics Concern  . Not on file  Social History Narrative  . Not on file   Social Determinants of Health   Financial Resource Strain: Not on file  Food Insecurity: Not on file  Transportation Needs: Not on file  Physical Activity: Not on file  Stress: Not on file  Social Connections: Not on file  Intimate Partner Violence: Not on file     BP 118/64   Pulse 72   Ht 6' (1.829 m)   Wt 215 lb (97.5 kg)   BMI 29.16 kg/m   Physical Exam:  Well appearing NAD HEENT: Unremarkable Neck:  No JVD, no thyromegally Lymphatics:  No adenopathy Back:  No CVA tenderness Lungs:  Clear HEART:  Regular rate rhythm, no  murmurs, no rubs, no clicks Abd:  soft, positive bowel sounds, no organomegally, no rebound, no guarding Ext:  2 plus pulses, no edema, no cyanosis, no clubbing Skin:  No rashes no nodules Neuro:  CN II through XII intact, motor grossly intact  DEVICE  Normal device function.  See PaceArt for details.   Assess/Plan: 1. Atrial fib - his VR is well controlled. No change in meds. 2. Chronic systolic heart failure - his symptoms are class 2. He will continue his current meds. He is much improved since his TAVR and cardiac rehab is going well. He still has an EF of 25%.  3. CHB - he is pacing much of the time though he does have some PVC's.  4. PPM -his medtronic DDD PM is working normally. We will recheck in several months.  Carleene Overlie Damesha Lawler,MD

## 2020-07-12 NOTE — Patient Instructions (Signed)
Medication Instructions:  Your physician recommends that you continue on your current medications as directed. Please refer to the Current Medication list given to you today.  *If you need a refill on your cardiac medications before your next appointment, please call your pharmacy*   Lab Work: None today  If you have labs (blood work) drawn today and your tests are completely normal, you will receive your results only by: Marland Kitchen MyChart Message (if you have MyChart) OR . A paper copy in the mail If you have any lab test that is abnormal or we need to change your treatment, we will call you to review the results.   Testing/Procedures: None today    Follow-Up: At [facility], you and your health needs are our priority.  As part of our continuing mission to provide you with exceptional heart care, we have created designated Provider Care Teams.  These Care Teams include your primary Cardiologist (physician) and Advanced Practice Providers (APPs -  Physician Assistants and Nurse Practitioners) who all work together to provide you with the care you need, when you need it.  We recommend signing up for the patient portal called "MyChart".  Sign up information is provided on this After Visit Summary.  MyChart is used to connect with patients for Virtual Visits (Telemedicine).  Patients are able to view lab/test results, encounter notes, upcoming appointments, etc.  Non-urgent messages can be sent to your provider as well.   To learn more about what you can do with MyChart, go to [email or web address].    Your next appointment:   12 month(s)  The format for your next appointment:   In Person  Provider:   [name], MD   Other Instructions  None

## 2020-07-13 ENCOUNTER — Other Ambulatory Visit: Payer: Self-pay

## 2020-07-13 ENCOUNTER — Encounter (HOSPITAL_COMMUNITY)
Admission: RE | Admit: 2020-07-13 | Discharge: 2020-07-13 | Disposition: A | Discharge status: Home / Self Care | Payer: Medicare HMO | Source: Ambulatory Visit | Attending: Internal Medicine | Admitting: Internal Medicine

## 2020-07-13 DIAGNOSIS — Z952 Presence of prosthetic heart valve: Secondary | ICD-10-CM | POA: Diagnosis not present

## 2020-07-13 DIAGNOSIS — E1122 Type 2 diabetes mellitus with diabetic chronic kidney disease: Secondary | ICD-10-CM | POA: Diagnosis not present

## 2020-07-13 NOTE — Progress Notes (Signed)
Daily Session Note  Patient Details  Name: CEJAY CAMBRE MRN: [id number] Date of Birth: [birth year] Referring Provider:   Flowsheet Row CARDIAC REHAB PHASE II EXERCISE from 07/04/2020 in [place]  Referring Provider taylor      Encounter Date: 07/13/2020  Check In:  Session Check In - 07/13/20 1445      Check-In   Supervising physician immediately available to respond to emergencies [facility] MD immediately available    Physician(s) Dr. Harl Bowie    Location [facility]    Staff Present Hoy Register, MS, ACSM-CEP, Exercise Physiologist;Euva Rundell Audria Nine, MS, Exercise Physiologist;Debra Wynetta Emery, RN, BSN    Virtual Visit No    Medication changes reported     No    Fall or balance concerns reported    No    Tobacco Cessation No Change    Warm-up and Cool-down Performed as group-led instruction    Resistance Training Performed Yes    VAD Patient? No    PAD/SET Patient? No      Pain Assessment   Currently in Pain? No/denies    Multiple Pain Sites No           Capillary Blood Glucose: No results found for this or any previous visit (from the past 24 hour(s)).    Social History   Tobacco Use  Smoking Status Former Smoker  . Packs/day: 1.00  . Years: 10.00  . Pack years: 10.00  . Types: Cigarettes  . Quit date: 03/26/1986  . Years since quitting: 34.3  Smokeless Tobacco Never Used  Tobacco Comment   "stopped cigarette  smoking 1988"    Goals Met:  Independence with exercise equipment Exercise tolerated well No report of cardiac concerns or symptoms Strength training completed today  Goals Unmet:  Not Applicable  Comments: check out 1545   Dr. Kathie Dike is Medical Director for [facility] Pulmonary Rehab.

## 2020-07-14 ENCOUNTER — Ambulatory Visit (INDEPENDENT_AMBULATORY_CARE_PROVIDER_SITE_OTHER): Payer: Medicare HMO

## 2020-07-14 DIAGNOSIS — I428 Other cardiomyopathies: Secondary | ICD-10-CM

## 2020-07-14 LAB — CUP PACEART REMOTE DEVICE CHECK
Battery Impedance: 353 Ohm
Battery Remaining Longevity: 87 mo
Battery Voltage: 2.78 V
Brady Statistic AP VP Percent: 5 %
Brady Statistic AP VS Percent: 0 %
Brady Statistic AS VP Percent: 8 %
Brady Statistic AS VS Percent: 88 %
Date Time Interrogation Session: 20220421100721
Implantable Lead Implant Date: 20170605
Implantable Lead Implant Date: 20170605
Implantable Lead Location: 753859
Implantable Lead Location: 753860
Implantable Lead Model: 5076
Implantable Lead Model: 5076
Implantable Pulse Generator Implant Date: 20170605
Lead Channel Impedance Value: 383 Ohm
Lead Channel Impedance Value: 508 Ohm
Lead Channel Pacing Threshold Amplitude: 0.625 V
Lead Channel Pacing Threshold Amplitude: 0.875 V
Lead Channel Pacing Threshold Pulse Width: 0.4 ms
Lead Channel Pacing Threshold Pulse Width: 0.4 ms
Lead Channel Setting Pacing Amplitude: 2 V
Lead Channel Setting Pacing Amplitude: 2.5 V
Lead Channel Setting Pacing Pulse Width: 0.4 ms
Lead Channel Setting Sensing Sensitivity: 4 mV

## 2020-07-15 ENCOUNTER — Encounter (HOSPITAL_COMMUNITY)
Admission: RE | Admit: 2020-07-15 | Discharge: 2020-07-15 | Disposition: A | Discharge status: Home / Self Care | Payer: Medicare HMO | Source: Ambulatory Visit | Attending: Internal Medicine | Admitting: Internal Medicine

## 2020-07-15 ENCOUNTER — Other Ambulatory Visit: Payer: Self-pay

## 2020-07-15 VITALS — Wt 213.0 lb

## 2020-07-15 DIAGNOSIS — Z952 Presence of prosthetic heart valve: Secondary | ICD-10-CM | POA: Diagnosis not present

## 2020-07-15 DIAGNOSIS — E1122 Type 2 diabetes mellitus with diabetic chronic kidney disease: Secondary | ICD-10-CM | POA: Diagnosis not present

## 2020-07-15 NOTE — Progress Notes (Signed)
Daily Session Note  Patient Details  Name: John [name] MRN: [id number] Date of Birth: 06/27/1945 Referring Provider:   Flowsheet Row CARDIAC REHAB PHASE II EXERCISE from 07/04/2020 in [place]  Referring Provider taylor      Encounter Date: 07/15/2020  Check In:  Session Check In - 07/15/20 1445      Check-In   Supervising physician immediately available to respond to emergencies [facility] MD immediately available    Physician(s) Dr. Domenic Polite    Location [facility]    Staff Present Cathren Harsh, MS, Exercise Physiologist;Debra Wynetta Emery, RN, BSN    Virtual Visit No    Medication changes reported     No    Fall or balance concerns reported    No    Tobacco Cessation No Change    Warm-up and Cool-down Performed as group-led instruction    Resistance Training Performed Yes    VAD Patient? No    PAD/SET Patient? No      Pain Assessment   Currently in Pain? No/denies    Multiple Pain Sites No           Capillary Blood Glucose: No results found for this or any previous visit (from the past 24 hour(s)).    Social History   Tobacco Use  Smoking Status Former Smoker  . Packs/day: 1.00  . Years: 10.00  . Pack years: 10.00  . Types: Cigarettes  . Quit date: 03/26/1986  . Years since quitting: 34.3  Smokeless Tobacco Never Used  Tobacco Comment   "stopped cigarette  smoking 1988"    Goals Met:  Independence with exercise equipment Exercise tolerated well No report of cardiac concerns or symptoms Strength training completed today  Goals Unmet:  Not Applicable  Comments: check out 1545   Dr. Kathie Dike is Medical Director for [facility] Pul[facility].

## 2020-07-17 NOTE — Progress Notes (Signed)
ADVANCED HF CLINIC NOTE  Referring Physician: Levell July NP, Cardiology  Primary Care: Erven Colla, DO Primary Cardiologist: Dr. Harl Bowie   Reason for Referral: Chronic Combined Systolic and Diastolic Heart Failure   HPI:  75 y/o male w/systolic HF due to NICM (cath 9/21 no CAD), h/o CHB s/p PPM 2017,  HTN, DM2, atrial flutter, stage 3b CKD (baseline SCr ~1.5-2.0) and severe AS s/p TAVR 2/22.Marland Kitchen   Echo 7/21 EF 30-35%. RV mildly reduced. Mod-sev MR Moderate AS  TEE 8/21  moderate MR and moderate TR. AoV severely thickened/heavily calcified  [facility] 9/21 No CAD. Moderately elevated filling pressures and moderately reduced CO. CI 1.8. Also concern for RV pacing CM with 42% RV pacing  Several admits in 12/21 for ADHF.   Admitted 2/22 for severe HF and TAVR. Started on dobutamine support. Underwent TVRF 05/03/20. Diuresed and DBA weaned off.   Attempted CRT upgrade 3/22. Left subclavian vein occluded  Echo 3/22: EF 25-30% moderate MR. TAVR stable  Here for routine f/u. Going to [place]. Says he feels ok. Working on [employer] and [employer]. For past 3 days has gotten much worse now having orthopnea and PND. No CP. No LE edema. Drinking a lot of water. Weight up 10 pounds.    Past Medical History:  Diagnosis Date  . Atrial flutter ([place]) 12/2010   Admitted with symptomatic bradycardia (HR 40s), atrial flutter with slow ventricular response 12/2010 + volume overload; AV nodal agents d/c'd and Pradaxa started; RFA in 01/2011  . CHF (congestive heart failure) ([place])   . Chronic combined systolic and diastolic heart failure ([place])    a. echo 01/06/11: mild LVH, EF 65-70%, mod to severe LAE, mild RVE, mild RAE, PASP 32;   TEE 10/12: EF 45-50% b. EF 45 to 50% by echo in 07/2018  . Class 2 severe obesity due to excess calories with serious comorbidity and body mass index (BMI) of 37.0 to 37.9 in adult ([place]) 07/17/2017  . Diabetes mellitus    non insulin dependant  . Hematoma, chest wall 05/15/2020  .  Hyperlipidemia   . Hypertension   . Osteoarthritis   . 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 Presence of permanent cardiac pacemaker   . PSVT (paroxysmal supraventricular tachycardia) ([facility])    Possibly atrial flutter  . S/P TAVR (transcatheter aortic valve replacement) 05/03/2020   s/p TAVR with a 29 mm Edwards S3U via the subclavian approach with Dr. Angelena Form & Dr. Cyndia Bent   . Severe aortic stenosis     Current Outpatient Medications  Medication Sig Dispense Refill  . ACCU-CHEK AVIVA PLUS test strip TEST BLOOD SUGAR TWICE DAILY BEFORE BREAKFAST AND AT BEDTIME 200 strip 1  . acetaminophen (TYLENOL) 325 MG tablet Take 2 tablets (650 mg total) by mouth every 4 (four) hours as needed for headache or mild pain.    Marland Kitchen allopurinol (ZYLOPRIM) 300 MG tablet Take 0.5 tablets (150 mg total) by mouth daily. 30 tablet 6  . apixaban (ELIQUIS) 5 MG TABS tablet Take 1 tablet (5 mg total) by mouth 2 (two) times daily. 180 tablet 3  . aspirin 81 MG chewable tablet Chew 1 tablet (81 mg total) by mouth daily.    . benzonatate (TESSALON) 100 MG capsule Take 1 capsule (100 mg total) by mouth 3 (three) times daily as needed for cough. 90 capsule 3  . carvedilol (COREG) 3.125 MG tablet TAKE 1 TABLET IN THE MORNING AND 2 TABLETS IN THE EVENING 270 tablet 1  . colchicine 0.6 MG  tablet Take 1 tablet (0.6 mg total) by mouth daily as needed. 30 tablet 6  . dapagliflozin propanediol (FARXIGA) 10 MG TABS tablet Take 1 tablet (10 mg total) by mouth daily before breakfast. 30 tablet 3  . diphenoxylate-atropine (LOMOTIL) 2.5-0.025 MG tablet Take 1 tablet by mouth 4 (four) times daily as needed for diarrhea or loose stools.    . Insulin Pen Needle (BD PEN NEEDLE NANO U/F) 32G X 4 MM MISC 1 each by Does not apply route 4 (four) times daily. 150 each 5  . Multiple Vitamins-Minerals (ONE-A-DAY MENS 50+) TABS Take 1 tablet by mouth daily with breakfast.    . potassium chloride SA (KLOR-CON) 20 MEQ tablet Take 1.5 tablets (30 mEq total)  by mouth 2 (two) times daily. 90 tablet 6  . spironolactone (ALDACTONE) 25 MG tablet Take 0.5 tablets (12.5 mg total) by mouth daily. 15 tablet 6  . torsemide (DEMADEX) 20 MG tablet Take 2 tablets (40 mg total) by mouth 2 (two) times daily.    . TRULICITY 1.5 JK/0.9FG SOPN INJECT 1.5MG (1 PEN) SUBCUTANEOUSLY EVERY WEEK 2 mL 2   No current facility-administered medications for this encounter.    No Known Allergies    Social History   Socioeconomic History  . Marital status: Married    Spouse name: Not on file  . Number of children: 2  . Years of education: Not on file  . Highest education level: Not on file  Occupational History  . Occupation: Immunologist: RETIRED  Tobacco Use  . Smoking status: Former Smoker    Packs/day: 1.00    Years: 10.00    Pack years: 10.00    Types: Cigarettes    Quit date: 03/26/1986    Years since quitting: 34.3  . Smokeless tobacco: Never Used  . Tobacco comment: "stopped cigarette  smoking 1988"  Vaping Use  . Vaping Use: Never used  Substance and Sexual Activity  . Alcohol use: Not Currently    Alcohol/week: 0.0 standard drinks    Comment: "quit alcohol ~ 2007"  . Drug use: No  . Sexual activity: Yes    Partners: Female  Other Topics Concern  . Not on file  Social History Narrative  . Not on file   Social Determinants of Health   Financial Resource Strain: Not on file  Food Insecurity: Not on file  Transportation Needs: Not on file  Physical Activity: Not on file  Stress: Not on file  Social Connections: Not on file  Intimate Partner Violence: Not on file      Family History  Problem Relation Age of Onset  . Heart attack Mother   . Hypertension Mother   . Heart attack Father   . Hypertension Father   . Heart attack Brother   . Colon cancer Neg Hx     Vitals:   07/20/20 1126  BP: (!) 110/56  Pulse: 70  SpO2: 98%  Weight: 100.7 kg (222 lb)   Wt Readings from Last 3 Encounters:  07/20/20 100.7 kg  (222 lb)  07/12/20 97.5 kg (215 lb)  07/11/20 96.5 kg (212 lb 11.9 oz)    PHYSICAL EXAM: General:  Well appearing male, in wheel chair. No respiratory difficulty HEENT: normal Neck: supple. JVP to jaw Carotids 2+ bilat; no bruits. No lymphadenopathy or thryomegaly appreciated. Cor: PMI nondisplaced. Regular rate & rhythm.2/6 SEM RUSB Lungs: clear Abdomen: soft, nontender, + distended. No hepatosplenomegaly. No bruits or masses. Good bowel sounds. Extremities: no  cyanosis, clubbing, rash, 2+ edema Neuro: alert & orientedx3, cranial nerves grossly intact. moves all 4 extremities w/o difficulty. Affect pleasant  ReDS 36% Personally reviewed   ASSESSMENT & PLAN:  1. Acute on Chronic Combined Systolic and Diastolic Heart Failure/NICM: - Echo 08/2015 EF 50-55% (PPM placed in 08/2015)  - Echo 10/2015 EF dropped to 40-45% - Echo 2018 EF 45% - Echo 2021 EF 30-35%, RV mildly reduced  - Echo 3/22 EF 25-30% TAVR ok. Mod MR .-LHC 9/21 that showed no coronary disease.  - based on timing of drop in EF and 42% pacing percentage, concern for RV paced CM +AS - Left subclavian vein occluded -> unable to upgrade to CRT - s/p TAVR 2/22 - He had been doing much better but now worse in setting of volume overload with NYHA III-IIIb symptoms  - On torsemide 40 bid - Continue Farxiga 10 - Continue spiro 12.5 - Continue carvedilol 3.125/6.25 - Will give metolazone 2.5 mg daily x 2 days with 40 kcl each dose. Follow weights closely. If weight going back up can increase torsemide to 60 bid for a day or two as needed. Discussed need to cut fluid intake back.  - f/u 1 week  2. Mitral Regurgitation  - Moderate by TEE 8/21 and Echo 3/22 - likely functional from dilated CM  - will follow  3. Aortic Stenosis  - s/p TAVR 3/22 - stable on echo   4. Atrial Flutter/ Chronic Atrial Fibrillation - s/p AFL ablation  - in chronic Afib, rate controlled  - continue Eliquis  5. Stage IIIb CKD - followed by  nephrology  - baseline SCr ~1.5-1.8,  - Most recent SCr 3/22 1.43 0- repeat today  6. T2DM - on insulin - Continue Farxiga  7. H/o CHB  - s/p PPM followed by Dr. Lovena Le  - ? RV paced CM. He is pacing 42%. Had drop in EF 2 months after pacer was placed  - following with Dr. Lovena Le. Attempted CRT upgrade 3/22 but left subclavian vein occluded  Glori Bickers, MD  11:59 AM

## 2020-07-18 ENCOUNTER — Encounter (HOSPITAL_COMMUNITY): Payer: Medicare HMO

## 2020-07-19 ENCOUNTER — Other Ambulatory Visit: Payer: Self-pay | Admitting: "Endocrinology

## 2020-07-20 ENCOUNTER — Other Ambulatory Visit: Payer: Self-pay

## 2020-07-20 ENCOUNTER — Encounter (HOSPITAL_COMMUNITY): Payer: Medicare HMO

## 2020-07-20 ENCOUNTER — Ambulatory Visit (HOSPITAL_COMMUNITY)
Admission: RE | Admit: 2020-07-20 | Discharge: 2020-07-20 | Disposition: A | Discharge status: Home / Self Care | Payer: Medicare HMO | Source: Ambulatory Visit | Attending: Internal Medicine | Admitting: Internal Medicine

## 2020-07-20 ENCOUNTER — Encounter (HOSPITAL_COMMUNITY): Payer: Self-pay | Admitting: Internal Medicine

## 2020-07-20 VITALS — BP 110/56 | HR 70 | Wt 222.0 lb

## 2020-07-20 DIAGNOSIS — E1122 Type 2 diabetes mellitus with diabetic chronic kidney disease: Secondary | ICD-10-CM | POA: Insufficient documentation

## 2020-07-20 DIAGNOSIS — I080 Rheumatic disorders of both mitral and aortic valves: Secondary | ICD-10-CM | POA: Insufficient documentation

## 2020-07-20 DIAGNOSIS — I482 Chronic atrial fibrillation, unspecified: Secondary | ICD-10-CM | POA: Diagnosis not present

## 2020-07-20 DIAGNOSIS — I428 Other cardiomyopathies: Secondary | ICD-10-CM | POA: Diagnosis not present

## 2020-07-20 DIAGNOSIS — Z952 Presence of prosthetic heart valve: Secondary | ICD-10-CM | POA: Insufficient documentation

## 2020-07-20 DIAGNOSIS — I5043 Acute on chronic combined systolic (congestive) and diastolic (congestive) heart failure: Secondary | ICD-10-CM | POA: Insufficient documentation

## 2020-07-20 DIAGNOSIS — N1832 Chronic kidney disease, stage 3b: Secondary | ICD-10-CM | POA: Insufficient documentation

## 2020-07-20 DIAGNOSIS — I5022 Chronic systolic (congestive) heart failure: Secondary | ICD-10-CM | POA: Diagnosis not present

## 2020-07-20 DIAGNOSIS — I2510 Atherosclerotic heart disease of native coronary artery without angina pectoris: Secondary | ICD-10-CM | POA: Diagnosis not present

## 2020-07-20 DIAGNOSIS — Z7901 Long term (current) use of anticoagulants: Secondary | ICD-10-CM | POA: Diagnosis not present

## 2020-07-20 DIAGNOSIS — I4821 Permanent atrial fibrillation: Secondary | ICD-10-CM

## 2020-07-20 DIAGNOSIS — Z7982 Long term (current) use of aspirin: Secondary | ICD-10-CM | POA: Diagnosis not present

## 2020-07-20 DIAGNOSIS — Z794 Long term (current) use of insulin: Secondary | ICD-10-CM | POA: Diagnosis not present

## 2020-07-20 DIAGNOSIS — I130 Hypertensive heart and chronic kidney disease with heart failure and stage 1 through stage 4 chronic kidney disease, or unspecified chronic kidney disease: Secondary | ICD-10-CM | POA: Diagnosis not present

## 2020-07-20 DIAGNOSIS — Z79899 Other long term (current) drug therapy: Secondary | ICD-10-CM | POA: Diagnosis not present

## 2020-07-20 DIAGNOSIS — Z87891 Personal history of nicotine dependence: Secondary | ICD-10-CM | POA: Diagnosis not present

## 2020-07-20 DIAGNOSIS — I4892 Unspecified atrial flutter: Secondary | ICD-10-CM | POA: Insufficient documentation

## 2020-07-20 DIAGNOSIS — Z8249 Family history of ischemic heart disease and other diseases of the circulatory system: Secondary | ICD-10-CM | POA: Diagnosis not present

## 2020-07-20 LAB — BRAIN NATRIURETIC PEPTIDE: B Natriuretic Peptide: 1855.4 pg/mL — ABNORMAL HIGH (ref 0.0–100.0)

## 2020-07-20 LAB — BASIC METABOLIC PANEL
Anion gap: 10 (ref 5–15)
BUN: 25 mg/dL — ABNORMAL HIGH (ref 8–23)
CO2: 25 mmol/L (ref 22–32)
Calcium: 8.9 mg/dL (ref 8.9–10.3)
Chloride: 95 mmol/L — ABNORMAL LOW (ref 98–111)
Creatinine, Ser: 1.38 mg/dL — ABNORMAL HIGH (ref 0.61–1.24)
GFR, Estimated: 54 mL/min — ABNORMAL LOW (ref >60)
Glucose, Bld: 241 mg/dL — ABNORMAL HIGH (ref 70–99)
Potassium: 4.9 mmol/L (ref 3.5–5.1)
Sodium: 130 mmol/L — ABNORMAL LOW (ref 135–145)

## 2020-07-20 MED ORDER — METOLAZONE 2.5 MG PO TABS: 2.5000 mg | ORAL_TABLET | ORAL | 3 refills | Status: DC

## 2020-07-20 MED ORDER — POTASSIUM CHLORIDE CRYS ER 20 MEQ PO TBCR: 30.0000 meq | EXTENDED_RELEASE_TABLET | Freq: Two times a day (BID) | ORAL | 6 refills | Status: DC

## 2020-07-20 MED ORDER — ALLOPURINOL 300 MG PO TABS: 150.0000 mg | ORAL_TABLET | Freq: Every day | ORAL | 6 refills | Status: DC

## 2020-07-20 MED ORDER — CARVEDILOL 3.125 MG PO TABS: ORAL_TABLET | ORAL | 6 refills | Status: DC

## 2020-07-20 MED ORDER — SPIRONOLACTONE 25 MG PO TABS: 12.5000 mg | ORAL_TABLET | Freq: Every day | ORAL | 6 refills | Status: DC

## 2020-07-20 NOTE — Progress Notes (Signed)
ReDS Vest / Clip - 07/20/20 1200      ReDS Vest / Clip   Station Marker D    Ruler Value 32    ReDS Value Range Moderate volume overload    ReDS Actual Value 36

## 2020-07-20 NOTE — Patient Instructions (Addendum)
Take Metolazone 2.5 mg TODAY AND TOMORROW only  Take an extra 40 meq (2 tabs) of Potassium TODAY AND TOMORROW when you take Metolazone  IF weight is up 3 lbs overnight or 5 lbs in a week increase Torsemide to 60 mg (3 tabs) Twice daily for THAT DAY ONLY, then back to your 40 mg(2 tabs) Twice daily   Labs done today, your results will be available in MyChart, we will contact you for abnormal readings.  Your physician recommends that you schedule a follow-up appointment in: 1 week  If you have any questions or concerns before your next appointment please send [place] a message through [place] or call our office at [phone].    TO LEAVE A MESSAGE FOR THE NURSE SELECT OPTION 2, PLEASE LEAVE A MESSAGE INCLUDING: . YOUR NAME . DATE OF BIRTH . CALL BACK NUMBER . REASON FOR CALL**this is important as we prioritize the call backs  [place] AS LONG AS YOU CALL BEFORE 4:00 PM  At the [facility], you and your health needs are our priority. As part of our continuing mission to provide you with exceptional heart care, we have created designated Provider Care Teams. These Care Teams include your primary Cardiologist (physician) and Advanced Practice Providers (APPs- Physician Assistants and Nurse Practitioners) who all work together to provide you with the care you need, when you need it.   You may see any of the following providers on your designated Care Team at your next follow up: Marland Kitchen Dr Glori Bickers . Dr Loralie Champagne . Dr Vickki Muff . Darrick Grinder, NP . Lyda Jester, [place] . Audry Riles, PharmD   Please be sure to bring in all your medications bottles to every appointment.

## 2020-07-20 NOTE — Addendum Note (Signed)
Encounter addended by: Jolaine Artist, MD on: 07/20/2020 12:48 PM  Actions taken: Level of Service modified, Visit diagnoses modified

## 2020-07-22 ENCOUNTER — Encounter (HOSPITAL_COMMUNITY): Payer: Medicare HMO

## 2020-07-25 ENCOUNTER — Encounter (HOSPITAL_COMMUNITY): Payer: Medicare HMO

## 2020-07-27 ENCOUNTER — Encounter (HOSPITAL_COMMUNITY): Payer: Medicare HMO

## 2020-07-27 NOTE — Progress Notes (Signed)
Cardiac Individual Treatment Plan  Patient Details  Name: John [name] MRN: [id number] Date of Birth: December 28, 1945 Referring Provider:   Flowsheet Row CARDIAC REHAB PHASE II EXERCISE from 07/04/2020 in [facility]  Referring Provider taylor      Initial Encounter Date:  [place] PHASE II EXERCISE from 07/04/2020 in [place]  Date 07/04/20      Visit Diagnosis: S/P TAVR (transcatheter aortic valve replacement)  Patient's Home Medications on Admission:  Current Outpatient Medications:  .  ACCU-CHEK AVIVA PLUS test strip, TEST BLOOD SUGAR TWICE DAILY BEFORE BREAKFAST AND AT BEDTIME, Disp: 200 strip, Rfl: 1 .  acetaminophen (TYLENOL) 325 MG tablet, Take 2 tablets (650 mg total) by mouth every 4 (four) hours as needed for headache or mild pain., Disp: , Rfl:  .  allopurinol (ZYLOPRIM) 300 MG tablet, Take 0.5 tablets (150 mg total) by mouth daily., Disp: 15 tablet, Rfl: 6 .  apixaban (ELIQUIS) 5 MG TABS tablet, Take 1 tablet (5 mg total) by mouth 2 (two) times daily., Disp: 180 tablet, Rfl: 3 .  aspirin 81 MG chewable tablet, Chew 1 tablet (81 mg total) by mouth daily., Disp: , Rfl:  .  benzonatate (TESSALON) 100 MG capsule, Take 1 capsule (100 mg total) by mouth 3 (three) times daily as needed for cough., Disp: 90 capsule, Rfl: 3 .  carvedilol (COREG) 3.125 MG tablet, TAKE 1 TABLET IN THE MORNING AND 2 TABLETS IN THE EVENING, Disp: 270 tablet, Rfl: 6 .  colchicine 0.6 MG tablet, Take 1 tablet (0.6 mg total) by mouth daily as needed., Disp: 30 tablet, Rfl: 6 .  dapagliflozin propanediol (FARXIGA) 10 MG TABS tablet, Take 1 tablet (10 mg total) by mouth daily before breakfast., Disp: 30 tablet, Rfl: 3 .  diphenoxylate-atropine (LOMOTIL) 2.5-0.025 MG tablet, Take 1 tablet by mouth 4 (four) times daily as needed for diarrhea or loose stools., Disp: , Rfl:  .  Insulin Pen Needle (BD PEN NEEDLE NANO U/F) 32G X 4 MM MISC, 1 each by  Does not apply route 4 (four) times daily., Disp: 150 each, Rfl: 5 .  metolazone (ZAROXOLYN) 2.5 MG tablet, Take 1 tablet (2.5 mg total) by mouth as directed. By [facility], Disp: 5 tablet, Rfl: 3 .  Multiple Vitamins-Minerals (ONE-A-DAY MENS 50+) TABS, Take 1 tablet by mouth daily with breakfast., Disp: , Rfl:  .  potassium chloride SA (KLOR-CON) 20 MEQ tablet, Take 1.5 tablets (30 mEq total) by mouth 2 (two) times daily. Take an extra 2 tabs when you take Metolazone, Disp: 60 tablet, Rfl: 6 .  spironolactone (ALDACTONE) 25 MG tablet, Take 0.5 tablets (12.5 mg total) by mouth daily., Disp: 15 tablet, Rfl: 6 .  torsemide (DEMADEX) 20 MG tablet, Take 2 tablets (40 mg total) by mouth 2 (two) times daily., Disp: , Rfl:  .  TRULICITY 1.5 EV/0.3JK SOPN, INJECT 1.5MG (1 PEN) SUBCUTANEOUSLY EVERY WEEK, Disp: 2 mL, Rfl: 2  Past Medical History: Past Medical History:  Diagnosis Date  . Atrial flutter ([place]) 12/2010   Admitted with symptomatic bradycardia (HR 40s), atrial flutter with slow ventricular response 12/2010 + volume overload; AV nodal agents d/c'd and Pradaxa started; RFA in 01/2011  . CHF (congestive heart failure) ([place])   . Chronic combined systolic and diastolic heart failure ([place])    a. echo 01/06/11: mild LVH, EF 65-70%, mod to severe LAE, mild RVE, mild RAE, PASP 32;   TEE 10/12: EF 45-50% b. EF 45 to  50% by echo in 07/2018  . Class 2 severe obesity due to excess calories with serious comorbidity and body mass index (BMI) of 37.0 to 37.9 in adult ([place]) 07/17/2017  . Diabetes mellitus    non insulin dependant  . Hematoma, chest wall 05/15/2020  . Hyperlipidemia   . Hypertension   . Osteoarthritis   . 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 Presence of permanent cardiac pacemaker   . PSVT (paroxysmal supraventricular tachycardia) ([facility])    Possibly atrial flutter  . S/P TAVR (transcatheter aortic valve replacement) 05/03/2020   s/p TAVR with a 29 mm Edwards S3U via the subclavian approach with  Dr. Angelena Form & Dr. Cyndia Bent   . Severe aortic stenosis     Tobacco Use: Social History   Tobacco Use  Smoking Status Former Smoker  . Packs/day: 1.00  . Years: 10.00  . Pack years: 10.00  . Types: Cigarettes  . Quit date: 03/26/1986  . Years since quitting: 34.3  Smokeless Tobacco Never Used  Tobacco Comment   "stopped cigarette  smoking 1988"    Labs: Recent Review Flowsheet Data    Labs for ITP Cardiac and Pulmonary Rehab Latest Ref Rng & Units 05/11/2020 05/12/2020 05/13/2020 05/14/2020 05/15/2020   Cholestrol 0 - 200 mg/dL - - - - -   LDLCALC 0 - 99 mg/dL - - - - -   HDL >40 mg/dL - - - - -   Trlycerides <150 mg/dL - - - - -   Hemoglobin A1c 4.8 - 5.6 % - - - - -   PHART 7.350 - 7.450 - - - - -   PCO2ART 32.0 - 48.0 mmHg - - - - -   HCO3 20.0 - 28.0 mmol/L - - - - -   TCO2 22 - 32 mmol/L - - - - -   O2SAT % 69.2 67.9 60.2 73.3 65.9      Capillary Blood Glucose: Lab Results  Component Value Date   GLUCAP 396 (H) 06/30/2020   GLUCAP 179 (H) 06/08/2020   GLUCAP 200 (H) 05/15/2020   GLUCAP 253 (H) 05/15/2020   GLUCAP 136 (H) 05/15/2020    POCT Glucose    Row Name 06/30/20 1311 07/04/20 1416           POCT Blood Glucose   Pre-Exercise 396 mg/dL --      Pre-Exercise #2 -- 158 mg/dL             Exercise Target Goals: Exercise Program Goal: Individual exercise prescription set using results from initial 6 min walk test and THRR while considering  patient's activity barriers and safety.   Exercise Prescription Goal: Starting with aerobic activity 30 plus minutes a day, 3 days per week for initial exercise prescription. Provide home exercise prescription and guidelines that participant acknowledges understanding prior to discharge.  Activity Barriers & Risk Stratification:  Activity Barriers & Cardiac Risk Stratification - 06/30/20 1240      Activity Barriers & Cardiac Risk Stratification   Activity Barriers Arthritis;Deconditioning;Decreased Ventricular  Function    Cardiac Risk Stratification High           6 Minute Walk:  6 Minute Walk    Row Name 07/04/20 1442         6 Minute Walk   Phase Initial     Distance 600 feet     Walk Time 6 minutes     # of Rest Breaks 2     MPH 1.1     METS  1.28     RPE 12     VO2 Peak 4.49     Symptoms Yes (comment)     Comments left knee pain 3/10     Resting HR 66 bpm     Resting BP 108/56     Resting Oxygen Saturation  98 %     Exercise Oxygen Saturation  during 6 min walk 98 %     Max Ex. HR 87 bpm     Max Ex. BP 122/54     2 Minute Post BP 118/60            Oxygen Initial Assessment:   Oxygen Re-Evaluation:   Oxygen Discharge (Final Oxygen Re-Evaluation):   Initial Exercise Prescription:  Initial Exercise Prescription - 07/04/20 1400      Date of Initial Exercise RX and Referring Provider   Date 07/04/20    Referring Provider taylor    Expected Discharge Date 09/21/20      NuStep   Level 1    SPM 60    Minutes 22      Arm Ergometer   Level 1    RPM 40    Minutes 17      Prescription Details   Frequency (times per week) 3    Duration Progress to 30 minutes of continuous aerobic without signs/symptoms of physical distress      Intensity   THRR 40-80% of Max Heartrate 58-117    Ratings of Perceived Exertion 11-13      Progression   Progression Continue to progress workloads to maintain intensity without signs/symptoms of physical distress.      Resistance Training   Training Prescription Yes    Weight 3    Reps 10-15           Perform Capillary Blood Glucose checks as needed.  Exercise Prescription Changes:   Exercise Prescription Changes    Row Name 07/11/20 1500 07/15/20 1505           Response to Exercise   Blood Pressure (Admit) 98/70 122/60      Blood Pressure (Exercise) 140/60 110/58      Blood Pressure (Exit) 124/62 114/68      Heart Rate (Admit) 62 bpm 87 bpm      Heart Rate (Exercise) 99 bpm 89 bpm      Heart Rate (Exit) 68  bpm 79 bpm      Rating of Perceived Exertion (Exercise) 13 13      Duration Continue with 30 min of aerobic exercise without signs/symptoms of physical distress. Continue with 30 min of aerobic exercise without signs/symptoms of physical distress.      Intensity THRR unchanged THRR unchanged             Progression   Progression Continue to progress workloads to maintain intensity without signs/symptoms of physical distress. Continue to progress workloads to maintain intensity without signs/symptoms of physical distress.             Resistance Training   Training Prescription Yes Yes      Weight 3 lbs 3 lb      Reps 10-15 10-15      Time 10 Minutes 10 Minutes             NuStep   Level 1 1      SPM 82 83      Minutes 17 17      METs 1.9 1.9  Arm Ergometer   Level 1 1      RPM 34 43      Minutes 22 22      METs 1.4 1.5             Exercise Comments:   Exercise Goals and Review:   Exercise Goals    Row Name 07/04/20 1448 07/25/20 1507           Exercise Goals   Increase Physical Activity Yes Yes      Intervention Develop an individualized exercise prescription for aerobic and resistive training based on initial evaluation findings, risk stratification, comorbidities and participant's personal goals.;Provide advice, education, support and counseling about physical activity/exercise needs. Develop an individualized exercise prescription for aerobic and resistive training based on initial evaluation findings, risk stratification, comorbidities and participant's personal goals.;Provide advice, education, support and counseling about physical activity/exercise needs.      Expected Outcomes Short Term: Attend rehab on a regular basis to increase amount of physical activity.;Long Term: Add in home exercise to make exercise part of routine and to increase amount of physical activity.;Long Term: Exercising regularly at least 3-5 days a week. Short Term: Attend rehab on  a regular basis to increase amount of physical activity.;Long Term: Add in home exercise to make exercise part of routine and to increase amount of physical activity.;Long Term: Exercising regularly at least 3-5 days a week.      Increase Strength and Stamina Yes Yes      Intervention Provide advice, education, support and counseling about physical activity/exercise needs.;Develop an individualized exercise prescription for aerobic and resistive training based on initial evaluation findings, risk stratification, comorbidities and participant's personal goals. Provide advice, education, support and counseling about physical activity/exercise needs.;Develop an individualized exercise prescription for aerobic and resistive training based on initial evaluation findings, risk stratification, comorbidities and participant's personal goals.      Expected Outcomes Short Term: Increase workloads from initial exercise prescription for resistance, speed, and METs.;Short Term: Perform resistance training exercises routinely during rehab and add in resistance training at home;Long Term: Improve cardiorespiratory fitness, muscular endurance and strength as measured by increased METs and functional capacity (6MWT) Short Term: Increase workloads from initial exercise prescription for resistance, speed, and METs.;Short Term: Perform resistance training exercises routinely during rehab and add in resistance training at home;Long Term: Improve cardiorespiratory fitness, muscular endurance and strength as measured by increased METs and functional capacity (6MWT)      Able to understand and use rate of perceived exertion (RPE) scale Yes Yes      Intervention Provide education and explanation on how to use RPE scale Provide education and explanation on how to use RPE scale      Expected Outcomes Short Term: Able to use RPE daily in rehab to express subjective intensity level;Long Term:  Able to use RPE to guide intensity level when  exercising independently Short Term: Able to use RPE daily in rehab to express subjective intensity level;Long Term:  Able to use RPE to guide intensity level when exercising independently      Knowledge and understanding of Target Heart Rate Range (THRR) Yes Yes      Intervention Provide education and explanation of THRR including how the numbers were predicted and where they are located for reference Provide education and explanation of THRR including how the numbers were predicted and where they are located for reference      Expected Outcomes Short Term: Able to state/look up THRR;Long Term: Able to  use THRR to govern intensity when exercising independently;Short Term: Able to use daily as guideline for intensity in rehab Short Term: Able to state/look up THRR;Long Term: Able to use THRR to govern intensity when exercising independently;Short Term: Able to use daily as guideline for intensity in rehab      Able to check pulse independently Yes Yes      Intervention Provide education and demonstration on how to check pulse in carotid and radial arteries.;Review the importance of being able to check your own pulse for safety during independent exercise Provide education and demonstration on how to check pulse in carotid and radial arteries.;Review the importance of being able to check your own pulse for safety during independent exercise      Expected Outcomes Short Term: Able to explain why pulse checking is important during independent exercise;Long Term: Able to check pulse independently and accurately Short Term: Able to explain why pulse checking is important during independent exercise;Long Term: Able to check pulse independently and accurately      Understanding of Exercise Prescription Yes Yes      Intervention Provide education, explanation, and written materials on patient's individual exercise prescription Provide education, explanation, and written materials on patient's individual exercise  prescription      Expected Outcomes Long Term: Able to explain home exercise prescription to exercise independently;Short Term: Able to explain program exercise prescription Long Term: Able to explain home exercise prescription to exercise independently;Short Term: Able to explain program exercise prescription             Exercise Goals Re-Evaluation :  Exercise Goals Re-Evaluation    [place] Name 07/15/20 1507             Exercise Goal Re-Evaluation   Exercise Goals Review Increase Strength and Stamina;Increase Physical Activity;Able to understand and use rate of perceived exertion (RPE) scale;Knowledge and understanding of Target Heart Rate Range (THRR);Able to check pulse independently;Understanding of Exercise Prescription       Comments Patient has completed 5 exercise sessions. He has been tolerating exercise well. He has been progressing slowly. He has had inconsistent attendance. He is currently exercising at 1.9 METs on the NuStep. He is very positive in class and has a bright outlook on his goals. Will continue to monitor and progress as able.       Expected Outcomes Throughout exercise at rehab and at home patient will achieve their goals.               Discharge Exercise Prescription (Final Exercise Prescription Changes):  Exercise Prescription Changes - 07/15/20 1505      Response to Exercise   Blood Pressure (Admit) 122/60    Blood Pressure (Exercise) 110/58    Blood Pressure (Exit) 114/68    Heart Rate (Admit) 87 bpm    Heart Rate (Exercise) 89 bpm    Heart Rate (Exit) 79 bpm    Rating of Perceived Exertion (Exercise) 13    Duration Continue with 30 min of aerobic exercise without signs/symptoms of physical distress.    Intensity THRR unchanged      Progression   Progression Continue to progress workloads to maintain intensity without signs/symptoms of physical distress.      Resistance Training   Training Prescription Yes    Weight 3 lb    Reps 10-15    Time  10 Minutes      NuStep   Level 1    SPM 83    Minutes 17  METs 1.9      Arm Ergometer   Level 1    RPM 43    Minutes 22    METs 1.5           Nutrition:  Target Goals: Understanding of nutrition guidelines, daily intake of sodium <1562m, cholesterol <2034m calories 30% from fat and 7% or less from saturated fats, daily to have 5 or more servings of fruits and vegetables.  Biometrics:  Pre Biometrics - 07/25/20 1514      Pre Biometrics   Weight 96.6 kg    BMI (Calculated) 28.88            Nutrition Therapy Plan and Nutrition Goals:  Nutrition Therapy & Goals - 07/20/20 0953      Personal Nutrition Goals   Comments Patient scored 22 on his diet assessment. We provide 2 educational sessions with handout on heart healthy nutrition and offer RD referral if patient is interested.      Intervention Plan   Intervention Nutrition handout(s) given to patient.           Nutrition Assessments:  Nutrition Assessments - 06/30/20 1243      MEDFICTS Scores   Pre Score 23          MEDIFICTS Score Key:  ?70 Need to make dietary changes   40-70 Heart Healthy Diet  ? 40 Therapeutic Level Cholesterol Diet   Picture Your Plate Scores:  <4<25nhealthy dietary pattern with much room for improvement.  41-50 Dietary pattern unlikely to meet recommendations for good health and room for improvement.  51-60 More healthful dietary pattern, with some room for improvement.   >60 Healthy dietary pattern, although there may be some specific behaviors that could be improved.    Nutrition Goals Re-Evaluation:   Nutrition Goals Discharge (Final Nutrition Goals Re-Evaluation):   Psychosocial: Target Goals: Acknowledge presence or absence of significant depression and/or stress, maximize coping skills, provide positive support system. Participant is able to verbalize types and ability to use techniques and skills needed for reducing stress and depression.  Initial  Review & Psychosocial Screening:  Initial Psych Review & Screening - 06/30/20 1241      Initial Review   Current issues with None Identified      Family Dynamics   Good Support System? Yes    Comments His wife is his support system.      Barriers   Psychosocial barriers to participate in program There are no identifiable barriers or psychosocial needs.      Screening Interventions   Interventions Encouraged to exercise    Expected Outcomes Long Term Goal: Stressors or current issues are controlled or eliminated.;Long Term goal: The participant improves quality of Life and PHQ9 Scores as seen by post scores and/or verbalization of changes           Quality of Life Scores:  Quality of Life - 07/04/20 1448      Quality of Life   Select Quality of Life      Quality of Life Scores   Health/Function Pre 16.9 %    Socioeconomic Pre 27.58 %    Psych/Spiritual Pre 26.5 %    Family Pre 13.5 %    GLOBAL Pre 20.58 %          Scores of 19 and below usually indicate a poorer quality of life in these areas.  A difference of  2-3 points is a clinically meaningful difference.  A difference of 2-3 points in the  total score of the Quality of Life Index has been associated with significant improvement in overall quality of life, self-image, physical symptoms, and general health in studies assessing change in quality of life.  PHQ-9: Recent Review Flowsheet Data    Depression screen [facility] 2/9 06/30/2020 04/15/2020 01/23/2019 11/24/2018 11/06/2018   Decreased Interest 0 0 0 0 0   Down, Depressed, Hopeless 0 0 0 0 0   PHQ - 2 Score 0 0 0 0 0   Altered sleeping 0 - - - -   Tired, decreased energy 1 - - - -   Change in appetite 0 - - - -   Feeling bad or failure about yourself  0 - - - -   Trouble concentrating 1 - - - -   Moving slowly or fidgety/restless 1 - - - -   Suicidal thoughts 0 - - - -   PHQ-9 Score 3 - - - -   Difficult doing work/chores Somewhat difficult - - - -      Interpretation of Total Score  Total Score Depression Severity:  1-4 = Minimal depression, 5-9 = Mild depression, 10-14 = Moderate depression, 15-19 = Moderately severe depression, 20-27 = Severe depression   Psychosocial Evaluation and Intervention:  Psychosocial Evaluation - 06/30/20 1306      Psychosocial Evaluation & Interventions   Interventions Encouraged to exercise with the program and follow exercise prescription    Comments Pt has no identifiable barriers to completing rehab. He has no identifiable psychosocial issues, and his PHQ-9 score is a 3. He was not very talkative, so it was hard to pull information from him. He states that his wife is his support system, and did not list anyone else, nor go into any detail about this. He states that his goal in the program is to get stronger. He was unable to do the physical portion of his orientation due to his blood sugar being 396. He states that this is uncommon.    Expected Outcomes The patient will continue to not have any identifiable psychosocial issues.    Continue Psychosocial Services  No Follow up required           Psychosocial Re-Evaluation:  Psychosocial Re-Evaluation    [place] Name 07/20/20 [id number]             Psychosocial Re-Evaluation   Current issues with None Identified       Comments Patient is new to the program completing 6 sessions. He continues to have no psychosocial issues identified. Will continue to monitor.       Expected Outcomes Patient will continue to have no psychosocial issues identified.       Interventions Stress management education;Encouraged to attend Cardiac Rehabilitation for the exercise;Relaxation education       Continue Psychosocial Services  No Follow up required              Psychosocial Discharge (Final Psychosocial Re-Evaluation):  Psychosocial Re-Evaluation - 07/20/20 9892      Psychosocial Re-Evaluation   Current issues with None Identified    Comments Patient is new to the  program completing 6 sessions. He continues to have no psychosocial issues identified. Will continue to monitor.    Expected Outcomes Patient will continue to have no psychosocial issues identified.    Interventions Stress management education;Encouraged to attend Cardiac Rehabilitation for the exercise;Relaxation education    Continue Psychosocial Services  No Follow up required  Vocational Rehabilitation: Provide vocational rehab assistance to qualifying candidates.   Vocational Rehab Evaluation & Intervention:  Vocational Rehab - 06/30/20 1243      Initial Vocational Rehab Evaluation & Intervention   Assessment shows need for Vocational Rehabilitation No      Vocational Rehab Re-Evaulation   Comments He is retired and has no desire to return to work           Education: Education Goals: Education classes will be provided on a weekly basis, covering required topics. Participant will state understanding/return demonstration of topics presented.  Learning Barriers/Preferences:  Learning Barriers/Preferences - 06/30/20 1247      Learning Barriers/Preferences   Learning Barriers None    Learning Preferences Audio;Video;Written Material;Skilled Demonstration           Education Topics: Hypertension, Hypertension Reduction -Define heart disease and high blood pressure. Discus how high blood pressure affects the body and ways to reduce high blood pressure.   Exercise and Your Heart -Discuss why it is important to exercise, the FITT principles of exercise, normal and abnormal responses to exercise, and how to exercise safely. Flowsheet Row CARDIAC REHAB PHASE II EXERCISE from 07/13/2020 in [place]  Date 07/06/20  Educator mk  Instruction Review Code 2- Demonstrated Understanding      Angina -Discuss definition of angina, causes of angina, treatment of angina, and how to decrease risk of having angina. Flowsheet Row CARDIAC REHAB PHASE  II EXERCISE from 07/13/2020 in [place]  Date 07/13/20  Educator mk  Instruction Review Code 2- Demonstrated Understanding      Cardiac Medications -Review what the following cardiac medications are used for, how they affect the body, and side effects that may occur when taking the medications.  Medications include Aspirin, Beta blockers, calcium channel blockers, ACE Inhibitors, angiotensin receptor blockers, diuretics, digoxin, and antihyperlipidemics.   Congestive Heart Failure -Discuss the definition of CHF, how to live with CHF, the signs and symptoms of CHF, and how keep track of weight and sodium intake.   Heart Disease and Intimacy -Discus the effect sexual activity has on the heart, how changes occur during intimacy as we age, and safety during sexual activity.   Smoking Cessation / COPD -Discuss different methods to quit smoking, the health benefits of quitting smoking, and the definition of COPD.   Nutrition I: Fats -Discuss the types of cholesterol, what cholesterol does to the heart, and how cholesterol levels can be controlled.   Nutrition II: Labels -Discuss the different components of food labels and how to read food label   Heart Parts/Heart Disease and PAD -Discuss the anatomy of the heart, the pathway of blood circulation through the heart, and these are affected by heart disease.   Stress I: Signs and Symptoms -Discuss the causes of stress, how stress may lead to anxiety and depression, and ways to limit stress.   Stress II: Relaxation -Discuss different types of relaxation techniques to limit stress.   Warning Signs of Stroke / TIA -Discuss definition of a stroke, what the signs and symptoms are of a stroke, and how to identify when someone is having stroke.   Knowledge Questionnaire Score:  Knowledge Questionnaire Score - 06/30/20 1247      Knowledge Questionnaire Score   Pre Score 15/24           Core  Components/Risk Factors/Patient Goals at Admission:  Personal Goals and Risk Factors at Admission - 06/30/20 1250      Core Components/Risk  Factors/Patient Goals on Admission   Improve shortness of breath with ADL's Yes    Intervention Provide education, individualized exercise plan and daily activity instruction to help decrease symptoms of SOB with activities of daily living.    Expected Outcomes Short Term: Improve cardiorespiratory fitness to achieve a reduction of symptoms when performing ADLs;Long Term: Be able to perform more ADLs without symptoms or delay the onset of symptoms    Diabetes Yes    Intervention Provide education about signs/symptoms and action to take for hypo/hyperglycemia.;Provide education about proper nutrition, including hydration, and aerobic/resistive exercise prescription along with prescribed medications to achieve blood glucose in normal ranges: Fasting glucose 65-99 mg/dL    Expected Outcomes Short Term: Participant verbalizes understanding of the signs/symptoms and immediate care of hyper/hypoglycemia, proper foot care and importance of medication, aerobic/resistive exercise and nutrition plan for blood glucose control.;Long Term: Attainment of HbA1C < 7%.    Heart Failure Yes    Intervention Provide a combined exercise and nutrition program that is supplemented with education, support and counseling about heart failure. Directed toward relieving symptoms such as shortness of breath, decreased exercise tolerance, and extremity edema.    Expected Outcomes Improve functional capacity of life;Short term: Attendance in program 2-3 days a week with increased exercise capacity. Reported lower sodium intake. Reported increased fruit and vegetable intake. Reports medication compliance.;Short term: Daily weights obtained and reported for increase. Utilizing diuretic protocols set by physician.;Long term: Adoption of self-care skills and reduction of barriers for early signs and  symptoms recognition and intervention leading to self-care maintenance.           Core Components/Risk Factors/Patient Goals Review:   Goals and Risk Factor Review    Row Name 07/20/20 0955             Core Components/Risk Factors/Patient Goals Review   Personal Goals Review Weight Management/Obesity;Diabetes       Review Patient is new to the program completing 6 sessions. He was referred to CR with TAVR. He has multiple risk factors for CAD and is participating in the program for risk modification. His last A1C on file was 05/03/20 at 5.7%. His blood pressure is well controlled. His personal goals for the program are to get stronger. We will continue to monitor his progress as he works toward meeting these goals.       Expected Outcomes Patient will complete the program meeting both personal and program goals.              Core Components/Risk Factors/Patient Goals at Discharge (Final Review):   Goals and Risk Factor Review - 07/20/20 0955      Core Components/Risk Factors/Patient Goals Review   Personal Goals Review Weight Management/Obesity;Diabetes    Review Patient is new to the program completing 6 sessions. He was referred to CR with TAVR. He has multiple risk factors for CAD and is participating in the program for risk modification. His last A1C on file was 05/03/20 at 5.7%. His blood pressure is well controlled. His personal goals for the program are to get stronger. We will continue to monitor his progress as he works toward meeting these goals.    Expected Outcomes Patient will complete the program meeting both personal and program goals.           ITP Comments:   Comments: ITP REVIEW Pt is making expected progress toward Cardiac Rehab goals after completing 7 sessions. Patient on hold currently due to volume overload. He has his medications  adjusted by Dr. Haroldine Laws and is to follow up in one week with him.

## 2020-07-28 ENCOUNTER — Ambulatory Visit (HOSPITAL_COMMUNITY)
Admission: RE | Admit: 2020-07-28 | Discharge: 2020-07-28 | Disposition: A | Discharge status: Home / Self Care | Payer: Medicare HMO | Source: Ambulatory Visit | Attending: Internal Medicine | Admitting: Internal Medicine

## 2020-07-28 ENCOUNTER — Other Ambulatory Visit: Payer: Self-pay

## 2020-07-28 VITALS — BP 122/56 | HR 66 | Wt 202.0 lb

## 2020-07-28 DIAGNOSIS — Z87891 Personal history of nicotine dependence: Secondary | ICD-10-CM | POA: Diagnosis not present

## 2020-07-28 DIAGNOSIS — E1122 Type 2 diabetes mellitus with diabetic chronic kidney disease: Secondary | ICD-10-CM | POA: Insufficient documentation

## 2020-07-28 DIAGNOSIS — Z7982 Long term (current) use of aspirin: Secondary | ICD-10-CM | POA: Insufficient documentation

## 2020-07-28 DIAGNOSIS — Z794 Long term (current) use of insulin: Secondary | ICD-10-CM | POA: Insufficient documentation

## 2020-07-28 DIAGNOSIS — Z8249 Family history of ischemic heart disease and other diseases of the circulatory system: Secondary | ICD-10-CM | POA: Insufficient documentation

## 2020-07-28 DIAGNOSIS — I130 Hypertensive heart and chronic kidney disease with heart failure and stage 1 through stage 4 chronic kidney disease, or unspecified chronic kidney disease: Secondary | ICD-10-CM | POA: Diagnosis not present

## 2020-07-28 DIAGNOSIS — Z952 Presence of prosthetic heart valve: Secondary | ICD-10-CM

## 2020-07-28 DIAGNOSIS — I482 Chronic atrial fibrillation, unspecified: Secondary | ICD-10-CM | POA: Diagnosis not present

## 2020-07-28 DIAGNOSIS — I5032 Chronic diastolic (congestive) heart failure: Secondary | ICD-10-CM

## 2020-07-28 DIAGNOSIS — I4821 Permanent atrial fibrillation: Secondary | ICD-10-CM

## 2020-07-28 DIAGNOSIS — Z950 Presence of cardiac pacemaker: Secondary | ICD-10-CM | POA: Insufficient documentation

## 2020-07-28 DIAGNOSIS — I080 Rheumatic disorders of both mitral and aortic valves: Secondary | ICD-10-CM | POA: Diagnosis not present

## 2020-07-28 DIAGNOSIS — I5043 Acute on chronic combined systolic (congestive) and diastolic (congestive) heart failure: Secondary | ICD-10-CM | POA: Insufficient documentation

## 2020-07-28 DIAGNOSIS — N1832 Chronic kidney disease, stage 3b: Secondary | ICD-10-CM | POA: Insufficient documentation

## 2020-07-28 DIAGNOSIS — Z7984 Long term (current) use of oral hypoglycemic drugs: Secondary | ICD-10-CM | POA: Insufficient documentation

## 2020-07-28 DIAGNOSIS — Z7901 Long term (current) use of anticoagulants: Secondary | ICD-10-CM | POA: Diagnosis not present

## 2020-07-28 DIAGNOSIS — I442 Atrioventricular block, complete: Secondary | ICD-10-CM | POA: Insufficient documentation

## 2020-07-28 DIAGNOSIS — Z79899 Other long term (current) drug therapy: Secondary | ICD-10-CM | POA: Insufficient documentation

## 2020-07-28 DIAGNOSIS — N183 Chronic kidney disease, stage 3 unspecified: Secondary | ICD-10-CM | POA: Diagnosis not present

## 2020-07-28 DIAGNOSIS — I82B21 Chronic embolism and thrombosis of right subclavian vein: Secondary | ICD-10-CM | POA: Insufficient documentation

## 2020-07-28 LAB — BASIC METABOLIC PANEL
Anion gap: 7 (ref 5–15)
BUN: 36 mg/dL — ABNORMAL HIGH (ref 8–23)
CO2: 28 mmol/L (ref 22–32)
Calcium: 9 mg/dL (ref 8.9–10.3)
Chloride: 91 mmol/L — ABNORMAL LOW (ref 98–111)
Creatinine, Ser: 1.48 mg/dL — ABNORMAL HIGH (ref 0.61–1.24)
GFR, Estimated: 49 mL/min — ABNORMAL LOW (ref >60)
Glucose, Bld: 381 mg/dL — ABNORMAL HIGH (ref 70–99)
Potassium: 4.9 mmol/L (ref 3.5–5.1)
Sodium: 126 mmol/L — ABNORMAL LOW (ref 135–145)

## 2020-07-28 LAB — BRAIN NATRIURETIC PEPTIDE: B Natriuretic Peptide: 1701.6 pg/mL — ABNORMAL HIGH (ref 0.0–100.0)

## 2020-07-28 MED ORDER — METOLAZONE 2.5 MG PO TABS: 2.5000 mg | ORAL_TABLET | ORAL | 3 refills | Status: DC

## 2020-07-28 MED ORDER — TORSEMIDE 20 MG PO TABS: 60.0000 mg | ORAL_TABLET | Freq: Two times a day (BID) | ORAL | 3 refills | Status: DC

## 2020-07-28 MED ORDER — POTASSIUM CHLORIDE CRYS ER 20 MEQ PO TBCR: 40.0000 meq | EXTENDED_RELEASE_TABLET | Freq: Two times a day (BID) | ORAL | 6 refills | Status: DC

## 2020-07-28 NOTE — Progress Notes (Signed)
ADVANCED HF CLINIC NOTE  Referring Physician: Levell July NP, Cardiology  Primary Care: Erven Colla, DO Primary Cardiologist: Dr. Harl Bowie   Reason for Referral: Chronic Combined Systolic and Diastolic Heart Failure   HPI:  75 y/o male w/systolic HF due to NICM (cath 9/21 no CAD), h/o CHB s/p PPM 2017,  HTN, DM2, atrial flutter, stage 3b CKD (baseline SCr ~1.5-2.0) and severe AS s/p TAVR 2/22.Marland Kitchen   Echo 7/21 EF 30-35%. RV mildly reduced. Mod-sev MR Moderate AS  TEE 8/21  moderate MR and moderate TR. AoV severely thickened/heavily calcified  [facility] 9/21 No CAD. Moderately elevated filling pressures and moderately reduced CO. CI 1.8. Also concern for RV pacing CM with 42% RV pacing  Several admits in 12/21 for ADHF.   Admitted 2/22 for severe HF and TAVR. Started on dobutamine support. Underwent TVRF 05/03/20. Diuresed and DBA weaned off.   Attempted CRT upgrade 3/22. Left subclavian vein occluded  Echo 3/22: EF 25-30% moderate MR. TAVR stable  I saw him last week. He was volume overloaded. REDS 36%. Gave metolazone 2.5 mg daily x 2 days with 40 kcl each dose and asked to cut fluid intake back. Here for f/u. Weight down 20 pounds. Now 194 ay home.  Feels much better. Denies dyspnea, orthopnea or PND. No dizziness.    Past Medical History:  Diagnosis Date  . Atrial flutter ([place]) 12/2010   Admitted with symptomatic bradycardia (HR 40s), atrial flutter with slow ventricular response 12/2010 + volume overload; AV nodal agents d/c'd and Pradaxa started; RFA in 01/2011  . CHF (congestive heart failure) ([place])   . Chronic combined systolic and diastolic heart failure ([place])    a. echo 01/06/11: mild LVH, EF 65-70%, mod to severe LAE, mild RVE, mild RAE, PASP 32;   TEE 10/12: EF 45-50% b. EF 45 to 50% by echo in 07/2018  . Class 2 severe obesity due to excess calories with serious comorbidity and body mass index (BMI) of 37.0 to 37.9 in adult ([place]) 07/17/2017  . Diabetes mellitus    non  insulin dependant  . Hematoma, chest wall 05/15/2020  . Hyperlipidemia   . Hypertension   . Osteoarthritis   . 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 Presence of permanent cardiac pacemaker   . PSVT (paroxysmal supraventricular tachycardia) (HCC)    Possibly atrial flutter  . S/P TAVR (transcatheter aortic valve replacement) 05/03/2020   s/p TAVR with a 29 mm Edwards S3U via the subclavian approach with Dr. Angelena Form & Dr. Cyndia Bent   . Severe aortic stenosis     Current Outpatient Medications  Medication Sig Dispense Refill  . ACCU-CHEK AVIVA PLUS test strip TEST BLOOD SUGAR TWICE DAILY BEFORE BREAKFAST AND AT BEDTIME 200 strip 1  . acetaminophen (TYLENOL) 325 MG tablet Take 2 tablets (650 mg total) by mouth every 4 (four) hours as needed for headache or mild pain.    Marland Kitchen allopurinol (ZYLOPRIM) 300 MG tablet Take 0.5 tablets (150 mg total) by mouth daily. 15 tablet 6  . apixaban (ELIQUIS) 5 MG TABS tablet Take 1 tablet (5 mg total) by mouth 2 (two) times daily. 180 tablet 3  . aspirin 81 MG chewable tablet Chew 1 tablet (81 mg total) by mouth daily.    . benzonatate (TESSALON) 100 MG capsule Take 1 capsule (100 mg total) by mouth 3 (three) times daily as needed for cough. 90 capsule 3  . carvedilol (COREG) 3.125 MG tablet TAKE 1 TABLET IN THE MORNING AND 2 TABLETS IN  THE EVENING 270 tablet 6  . colchicine 0.6 MG tablet Take 1 tablet (0.6 mg total) by mouth daily as needed. 30 tablet 6  . dapagliflozin propanediol (FARXIGA) 10 MG TABS tablet Take 1 tablet (10 mg total) by mouth daily before breakfast. 30 tablet 3  . diphenoxylate-atropine (LOMOTIL) 2.5-0.025 MG tablet Take 1 tablet by mouth 4 (four) times daily as needed for diarrhea or loose stools.    . Insulin Pen Needle (BD PEN NEEDLE NANO U/F) 32G X 4 MM MISC 1 each by Does not apply route 4 (four) times daily. 150 each 5  . metolazone (ZAROXOLYN) 2.5 MG tablet Take 1 tablet (2.5 mg total) by mouth as directed. By [facility] Clinic 5 tablet 3  . Multiple  Vitamins-Minerals (ONE-A-DAY MENS 50+) TABS Take 1 tablet by mouth daily with breakfast.    . potassium chloride SA (KLOR-CON) 20 MEQ tablet Take 1.5 tablets (30 mEq total) by mouth 2 (two) times daily. Take an extra 2 tabs when you take Metolazone 60 tablet 6  . spironolactone (ALDACTONE) 25 MG tablet Take 0.5 tablets (12.5 mg total) by mouth daily. 15 tablet 6  . torsemide (DEMADEX) 20 MG tablet Take 2 tablets (40 mg total) by mouth 2 (two) times daily.    . TRULICITY 1.5 ZO/1.0RU SOPN INJECT 1.5MG (1 PEN) SUBCUTANEOUSLY EVERY WEEK 2 mL 2   No current facility-administered medications for this encounter.    No Known Allergies    Social History   Socioeconomic History  . Marital status: Married    Spouse name: Not on file  . Number of children: 2  . Years of education: Not on file  . Highest education level: Not on file  Occupational History  . Occupation: Immunologist: RETIRED  Tobacco Use  . Smoking status: Former Smoker    Packs/day: 1.00    Years: 10.00    Pack years: 10.00    Types: Cigarettes    Quit date: 03/26/1986    Years since quitting: 34.3  . Smokeless tobacco: Never Used  . Tobacco comment: "stopped cigarette  smoking 1988"  Vaping Use  . Vaping Use: Never used  Substance and Sexual Activity  . Alcohol use: Not Currently    Alcohol/week: 0.0 standard drinks    Comment: "quit alcohol ~ 2007"  . Drug use: No  . Sexual activity: Yes    Partners: Female  Other Topics Concern  . Not on file  Social History Narrative  . Not on file   Social Determinants of Health   Financial Resource Strain: Not on file  Food Insecurity: Not on file  Transportation Needs: Not on file  Physical Activity: Not on file  Stress: Not on file  Social Connections: Not on file  Intimate Partner Violence: Not on file      Family History  Problem Relation Age of Onset  . Heart attack Mother   . Hypertension Mother   . Heart attack Father   . Hypertension  Father   . Heart attack Brother   . Colon cancer Neg Hx     Vitals:   07/28/20 1419  Weight: 91.6 kg (202 lb)   Wt Readings from Last 3 Encounters:  07/28/20 91.6 kg (202 lb)  07/20/20 100.7 kg (222 lb)  07/25/20 96.6 kg (213 lb)    PHYSICAL EXAM: General:  Well appearing. No resp difficulty HEENT: normal Neck: supple. no JVD. Carotids 2+ bilat; no bruits. No lymphadenopathy or thryomegaly appreciated. Cor: PMI  nondisplaced. Irregular rate & rhythm. No rubs, gallops or murmurs. Lungs: clear Abdomen: soft, nontender, nondistended. No hepatosplenomegaly. No bruits or masses. Good bowel sounds. Extremities: no cyanosis, clubbing, rash, edema Neuro: alert & orientedx3, cranial nerves grossly intact. moves all 4 extremities w/o difficulty. Affect pleasant    ASSESSMENT & PLAN:  1. Acute on Chronic Combined Systolic and Diastolic Heart Failure/NICM: - Echo 08/2015 EF 50-55% (PPM placed in 08/2015)  - Echo 10/2015 EF dropped to 40-45% - Echo 2018 EF 45% - Echo 2021 EF 30-35%, RV mildly reduced  - Echo 3/22 EF 25-30% TAVR ok. Mod MR .-LHC 9/21 that showed no coronary disease.  - based on timing of drop in EF and 42% pacing percentage, concern for RV paced CM +AS - Left subclavian vein occluded -> unable to upgrade to CRT - s/p TAVR 2/22 - Volume status much better with 2 doses of metolazone last week. Weight down 20 pounds.  - NYHA II-III - Increase torsemide to 60 bid. Increase kdur to 40 bid. Take metolazone 2.5 mg with kcl 40 for weight of 200 or greater. Labs today and 1 week.  - Continue Farxiga 10 - Continue spiro 12.5 - Continue carvedilol 3.125/6.25  2. Mitral Regurgitation  - Moderate by TEE 8/21 and Echo 3/22 - likely functional from dilated CM  - will follow  3. Aortic Stenosis  - s/p TAVR 3/22 - stable on echo   4. Atrial Flutter/ Chronic Atrial Fibrillation - s/p AFL ablation  - in chronic Afib, rate controlled  - continue Eliquis. No bleeding   5. Stage  IIIb CKD - followed by nephrology  - baseline SCr ~1.5-1.8,  - Most recent SCr 1.38 - Recheck labs today and 1 week   6. T2DM - on insulin - Continue Farxiga  7. H/o CHB  - s/p PPM followed by Dr. Lovena Le  - ? RV paced CM. He is pacing 42%. Had drop in EF 2 months after pacer was placed  - following with Dr. Lovena Le. Attempted CRT upgrade 3/22 but left subclavian vein occluded  Glori Bickers, MD  2:22 PM

## 2020-07-28 NOTE — Patient Instructions (Signed)
**  GOAL WEIGHT= 194-199 LBS on home scale  Increase Torsemide to 60 mg (3 tabs) Twice daily   Increase Potassium to 40 meq (2 tabs) Twice daily   Take Metolazone 2.5 mg for weight of 200 lbs or greater  Make sure to take an extra 40 meq (2 tabs) of Potassium when you take Metolazone  Labs done today, your results will be available in MyChart, we will contact you for abnormal readings.  Your physician recommends that you return for lab work in: 1 week  Your physician recommends that you schedule a follow-up appointment in: 4 weeks  If you have any questions or concerns before your next appointment please send [place] a message through [place] or call our office at [phone].    TO LEAVE A MESSAGE FOR THE NURSE SELECT OPTION 2, PLEASE LEAVE A MESSAGE INCLUDING: . YOUR NAME . DATE OF BIRTH . CALL BACK NUMBER . REASON FOR CALL**this is important as we prioritize the call backs  [place] AS LONG AS YOU CALL BEFORE 4:00 PM  At [facility], you and your health needs are our priority. As part of our continuing mission to provide you with exceptional heart care, we have created designated Provider Care Teams. These Care Teams include your primary Cardiologist (physician) and Advanced Practice Providers (APPs- Physician Assistants and Nurse Practitioners) who all work together to provide you with the care you need, when you need it.   You may see any of the following providers on your designated Care Team at your next follow up: Marland Kitchen Dr Glori Bickers . Dr Loralie Champagne . Dr Vickki Muff . Darrick Grinder, NP . Lyda Jester, [place] . Audry Riles, PharmD   Please be sure to bring in all your medications bottles to every appointment.

## 2020-07-29 ENCOUNTER — Encounter (HOSPITAL_COMMUNITY): Payer: Medicare HMO

## 2020-08-01 ENCOUNTER — Encounter (HOSPITAL_COMMUNITY): Payer: Medicare HMO

## 2020-08-02 NOTE — Progress Notes (Signed)
Remote pacemaker transmission.   

## 2020-08-03 ENCOUNTER — Other Ambulatory Visit: Payer: Self-pay

## 2020-08-03 ENCOUNTER — Ambulatory Visit (HOSPITAL_COMMUNITY)
Admission: RE | Admit: 2020-08-03 | Discharge: 2020-08-03 | Disposition: A | Discharge status: Home / Self Care | Payer: Medicare HMO | Source: Ambulatory Visit | Attending: Internal Medicine | Admitting: Internal Medicine

## 2020-08-03 ENCOUNTER — Encounter (HOSPITAL_COMMUNITY): Payer: Medicare HMO

## 2020-08-03 DIAGNOSIS — I5032 Chronic diastolic (congestive) heart failure: Secondary | ICD-10-CM | POA: Insufficient documentation

## 2020-08-03 LAB — BASIC METABOLIC PANEL
Anion gap: 8 (ref 5–15)
BUN: 22 mg/dL (ref 8–23)
CO2: 27 mmol/L (ref 22–32)
Calcium: 9.1 mg/dL (ref 8.9–10.3)
Chloride: 94 mmol/L — ABNORMAL LOW (ref 98–111)
Creatinine, Ser: 1.34 mg/dL — ABNORMAL HIGH (ref 0.61–1.24)
GFR, Estimated: 56 mL/min — ABNORMAL LOW (ref >60)
Glucose, Bld: 395 mg/dL — ABNORMAL HIGH (ref 70–99)
Potassium: 4.7 mmol/L (ref 3.5–5.1)
Sodium: 129 mmol/L — ABNORMAL LOW (ref 135–145)

## 2020-08-03 LAB — BRAIN NATRIURETIC PEPTIDE: B Natriuretic Peptide: 1614.2 pg/mL — ABNORMAL HIGH (ref 0.0–100.0)

## 2020-08-05 ENCOUNTER — Other Ambulatory Visit: Payer: Self-pay

## 2020-08-05 ENCOUNTER — Encounter (HOSPITAL_COMMUNITY)
Admission: RE | Admit: 2020-08-05 | Discharge: 2020-08-05 | Disposition: A | Discharge status: Home / Self Care | Payer: Medicare HMO | Source: Ambulatory Visit | Attending: Internal Medicine | Admitting: Internal Medicine

## 2020-08-05 DIAGNOSIS — Z952 Presence of prosthetic heart valve: Secondary | ICD-10-CM | POA: Diagnosis not present

## 2020-08-05 NOTE — Progress Notes (Signed)
Daily Session Note  Patient Details  Name: John [name] MRN: [id number] Date of Birth: 06/17/1945 Referring Provider:   Flowsheet Row CARDIAC REHAB PHASE II EXERCISE from 07/04/2020 in [place]  Referring Provider taylor      Encounter Date: 08/05/2020  Check In:  Session Check In - 08/05/20 1445      Check-In   Supervising physician immediately available to respond to emergencies [facility] MD immediately available    Physician(s) Dr. Domenic Polite    Location [facility]    Staff Present Cathren Harsh, MS, Exercise Physiologist;Debra Wynetta Emery, RN, BSN;Phyllis Billingsley, RN    Virtual Visit No    Medication changes reported     No    Fall or balance concerns reported    No    Tobacco Cessation No Change    Warm-up and Cool-down Performed as group-led instruction    Resistance Training Performed Yes    VAD Patient? No    PAD/SET Patient? No      Pain Assessment   Currently in Pain? No/denies    Multiple Pain Sites No           Capillary Blood Glucose: No results found for this or any previous visit (from the past 24 hour(s)).    Social History   Tobacco Use  Smoking Status Former Smoker  . Packs/day: 1.00  . Years: 10.00  . Pack years: 10.00  . Types: Cigarettes  . Quit date: 03/26/1986  . Years since quitting: 34.3  Smokeless Tobacco Never Used  Tobacco Comment   "stopped cigarette  smoking 1988"    Goals Met:  Independence with exercise equipment Exercise tolerated well No report of cardiac concerns or symptoms Strength training completed today  Goals Unmet:  Not Applicable  Comments: check out 1545   Dr. Kathie Dike is Medical Director for [facility].

## 2020-08-06 ENCOUNTER — Other Ambulatory Visit: Payer: Self-pay | Admitting: Cardiology

## 2020-08-08 ENCOUNTER — Other Ambulatory Visit: Payer: Self-pay

## 2020-08-08 ENCOUNTER — Encounter (HOSPITAL_COMMUNITY)
Admission: RE | Admit: 2020-08-08 | Discharge: 2020-08-08 | Disposition: A | Discharge status: Home / Self Care | Payer: Medicare HMO | Source: Ambulatory Visit | Attending: Internal Medicine | Admitting: Internal Medicine

## 2020-08-08 VITALS — Wt 193.1 lb

## 2020-08-08 DIAGNOSIS — Z952 Presence of prosthetic heart valve: Secondary | ICD-10-CM | POA: Diagnosis not present

## 2020-08-08 NOTE — Progress Notes (Signed)
Daily Session Note  Patient Details  Name: John [name] MRN: [id number] Date of Birth: [birth year] Referring Provider:   Flowsheet Row CARDIAC REHAB PHASE II EXERCISE from 07/04/2020 in [place]  Referring Provider taylor      Encounter Date: 08/08/2020  Check In:  Session Check In - 08/08/20 1445      Check-In   Supervising physician immediately available to respond to emergencies [facility] MD immediately available    Physician(s) Dr. Johnsie Cancel    Location [facility]    Staff Present Cathren Harsh, MS, Exercise Physiologist;Dalton Kris Mouton, MS, ACSM-CEP, Exercise Physiologist    Virtual Visit No    Medication changes reported     No    Fall or balance concerns reported    No    Tobacco Cessation No Change    Warm-up and Cool-down Performed as group-led instruction    Resistance Training Performed Yes    VAD Patient? No    PAD/SET Patient? No      Pain Assessment   Currently in Pain? No/denies    Multiple Pain Sites No           Capillary Blood Glucose: No results found for this or any previous visit (from the past 24 hour(s)).    Social History   Tobacco Use  Smoking Status Former Smoker  . Packs/day: 1.00  . Years: 10.00  . Pack years: 10.00  . Types: Cigarettes  . Quit date: 03/26/1986  . Years since quitting: 34.3  Smokeless Tobacco Never Used  Tobacco Comment   "stopped cigarette  smoking 1988"    Goals Met:  Independence with exercise equipment Exercise tolerated well No report of cardiac concerns or symptoms Strength training completed today  Goals Unmet:  Not Applicable  Comments: checkout 1545   Dr. Kathie Dike is Medical Director for [facility] Pulmonary Rehab.

## 2020-08-10 ENCOUNTER — Other Ambulatory Visit: Payer: Self-pay | Admitting: Cardiology

## 2020-08-10 ENCOUNTER — Encounter (HOSPITAL_COMMUNITY)
Admission: RE | Admit: 2020-08-10 | Discharge: 2020-08-10 | Disposition: A | Discharge status: Home / Self Care | Payer: Medicare HMO | Source: Ambulatory Visit | Attending: Internal Medicine | Admitting: Internal Medicine

## 2020-08-10 ENCOUNTER — Encounter: Payer: Medicare HMO | Admitting: Internal Medicine

## 2020-08-10 ENCOUNTER — Other Ambulatory Visit: Payer: Self-pay

## 2020-08-10 DIAGNOSIS — Z952 Presence of prosthetic heart valve: Secondary | ICD-10-CM | POA: Diagnosis not present

## 2020-08-10 NOTE — Progress Notes (Signed)
Daily Session Note  Patient Details  Name: RAMON ZANDERS MRN: [id number] Date of Birth: [birth year] Referring Provider:   Flowsheet Row CARDIAC REHAB PHASE II EXERCISE from 07/04/2020 in [place]  Referring Provider taylor      Encounter Date: 08/10/2020  Check In:  Session Check In - 08/10/20 1445      Check-In   Supervising physician immediately available to respond to emergencies [facility] MD immediately available    Physician(s) Dr. Harl Bowie    Location [facility]    Staff Present Cathren Harsh, MS, Exercise Physiologist;Debra Wynetta Emery, RN, BSN    Virtual Visit No    Medication changes reported     No    Fall or balance concerns reported    No    Tobacco Cessation No Change    Warm-up and Cool-down Performed as group-led instruction    Resistance Training Performed Yes    VAD Patient? No    PAD/SET Patient? No      Pain Assessment   Currently in Pain? No/denies    Multiple Pain Sites No           Capillary Blood Glucose: No results found for this or any previous visit (from the past 24 hour(s)).    Social History   Tobacco Use  Smoking Status Former Smoker  . Packs/day: 1.00  . Years: 10.00  . Pack years: 10.00  . Types: Cigarettes  . Quit date: 03/26/1986  . Years since quitting: 34.4  Smokeless Tobacco Never Used  Tobacco Comment   "stopped cigarette  smoking 1988"    Goals Met:  Independence with exercise equipment Exercise tolerated well No report of cardiac concerns or symptoms Strength training completed today  Goals Unmet:  Not Applicable  Comments: check out 1545   Dr. Kathie Dike is Medical Director for [facility] Pulmonary Rehab.

## 2020-08-12 ENCOUNTER — Other Ambulatory Visit: Payer: Self-pay

## 2020-08-12 ENCOUNTER — Encounter (HOSPITAL_COMMUNITY)
Admission: RE | Admit: 2020-08-12 | Discharge: 2020-08-12 | Disposition: A | Discharge status: Home / Self Care | Payer: Medicare HMO | Source: Ambulatory Visit | Attending: Internal Medicine | Admitting: Internal Medicine

## 2020-08-12 DIAGNOSIS — Z952 Presence of prosthetic heart valve: Secondary | ICD-10-CM

## 2020-08-12 NOTE — Progress Notes (Signed)
Daily Session Note  Patient Details  Name: John [name] MRN: [id number] Date of Birth: Nov 20, 1945 Referring Provider:   Flowsheet Row CARDIAC REHAB PHASE II EXERCISE from 07/04/2020 in [place]  Referring Provider taylor      Encounter Date: 08/12/2020  Check In:  Session Check In - 08/12/20 1445      Check-In   Supervising physician immediately available to respond to emergencies [facility] MD immediately available    Physician(s) Dr. Harl Bowie    Location [facility]    Staff Present Cathren Harsh, MS, Exercise Physiologist;Debra Wynetta Emery, RN, BSN    Virtual Visit No    Medication changes reported     No    Fall or balance concerns reported    No    Warm-up and Cool-down Performed as group-led Higher education careers adviser Performed Yes    VAD Patient? No    PAD/SET Patient? No      Pain Assessment   Currently in Pain? No/denies    Multiple Pain Sites No           Capillary Blood Glucose: No results found for this or any previous visit (from the past 24 hour(s)).    Social History   Tobacco Use  Smoking Status Former Smoker  . Packs/day: 1.00  . Years: 10.00  . Pack years: 10.00  . Types: Cigarettes  . Quit date: 03/26/1986  . Years since quitting: 34.4  Smokeless Tobacco Never Used  Tobacco Comment   "stopped cigarette  smoking 1988"    Goals Met:  Independence with exercise equipment Exercise tolerated well No report of cardiac concerns or symptoms Strength training completed today  Goals Unmet:  Not Applicable  Comments: check out 1545   Dr. Kathie Dike is Medical Director for [facility] Pulmonary Rehab.

## 2020-08-15 ENCOUNTER — Ambulatory Visit: Payer: Medicare HMO | Admitting: Cardiology

## 2020-08-15 ENCOUNTER — Encounter (HOSPITAL_COMMUNITY): Payer: Medicare HMO

## 2020-08-17 ENCOUNTER — Other Ambulatory Visit: Payer: Self-pay

## 2020-08-17 ENCOUNTER — Encounter (HOSPITAL_COMMUNITY)
Admission: RE | Admit: 2020-08-17 | Discharge: 2020-08-17 | Disposition: A | Discharge status: Home / Self Care | Payer: Medicare HMO | Source: Ambulatory Visit | Attending: Internal Medicine | Admitting: Internal Medicine

## 2020-08-17 DIAGNOSIS — Z952 Presence of prosthetic heart valve: Secondary | ICD-10-CM

## 2020-08-17 NOTE — Progress Notes (Signed)
Daily Session Note  Patient Details  Name: John [name] MRN: [id number] Date of Birth: 1946-02-07 Referring Provider:   Flowsheet Row CARDIAC REHAB PHASE II EXERCISE from 07/04/2020 in [place]  Referring Provider taylor      Encounter Date: 08/17/2020  Check In:  Session Check In - 08/17/20 1445      Check-In   Supervising physician immediately available to respond to emergencies [facility] MD immediately available    Physician(s) Dr. Domenic Polite    Location [facility]    Staff Present Aundra Dubin, RN, BSN;[place] Audria Nine, MS, Exercise Physiologist    Virtual Visit No    Medication changes reported     No    Fall or balance concerns reported    No    Tobacco Cessation No Change    Warm-up and Cool-down Performed as group-led instruction    Resistance Training Performed Yes    VAD Patient? No    PAD/SET Patient? No      Pain Assessment   Currently in Pain? No/denies    Multiple Pain Sites No           Capillary Blood Glucose: No results found for this or any previous visit (from the past 24 hour(s)).    Social History   Tobacco Use  Smoking Status Former Smoker  . Packs/day: 1.00  . Years: 10.00  . Pack years: 10.00  . Types: Cigarettes  . Quit date: 03/26/1986  . Years since quitting: 34.4  Smokeless Tobacco Never Used  Tobacco Comment   "stopped cigarette  smoking 1988"    Goals Met:  Independence with exercise equipment Exercise tolerated well No report of cardiac concerns or symptoms Strength training completed today  Goals Unmet:  Not Applicable  Comments: Check out 1545.   Dr. Kathie Dike is Medical Director for [facility] Pulmonary Re[facility].

## 2020-08-19 ENCOUNTER — Telehealth: Payer: Self-pay | Admitting: *Deleted

## 2020-08-19 ENCOUNTER — Encounter (HOSPITAL_COMMUNITY): Payer: Medicare HMO

## 2020-08-19 NOTE — Chronic Care Management (AMB) (Signed)
  Chronic Care Management   Note  08/19/2020 Name: John [name] MRN: [id number] DOB: 09/30/1945  John [name] is a 75 y.o. year old male who is a primary care patient of Erven Colla, DO. I reached out to [name] by phone today in response to a referral sent by [name] PCP, Erven Colla, DO.     [name] was given information about Chronic Care Management services today including:  1. CCM service includes personalized support from designated clinical staff supervised by his physician, including individualized plan of care and coordination with other care providers 2. 24/7 contact phone numbers for assistance for urgent and routine care needs. 3. Service will only be billed when office clinical staff spend 20 minutes or more in a month to coordinate care. 4. Only one practitioner may furnish and bill the service in a calendar month. 5. The patient may stop CCM services at any time (effective at the end of the month) by phone call to the office staff. 6. The patient will be responsible for cost sharing (co-pay) of up to 20% of the service fee (after annual deductible is met).  Patient agreed to services and verbal consent obtained.   Follow up plan: Telephone appointment with care management team member scheduled for:09/21/2020  Satchel Heidinger  Care Guide, Embedded Care Coordination [facility]  Care Management  Direct Dial [phone]

## 2020-08-22 ENCOUNTER — Encounter (HOSPITAL_COMMUNITY): Payer: Medicare HMO

## 2020-08-24 ENCOUNTER — Other Ambulatory Visit: Payer: Self-pay

## 2020-08-24 ENCOUNTER — Encounter (HOSPITAL_COMMUNITY)
Admission: RE | Admit: 2020-08-24 | Discharge: 2020-08-24 | Disposition: A | Discharge status: Home / Self Care | Payer: Medicare HMO | Source: Ambulatory Visit | Attending: Internal Medicine | Admitting: Internal Medicine

## 2020-08-24 DIAGNOSIS — Z952 Presence of prosthetic heart valve: Secondary | ICD-10-CM | POA: Diagnosis not present

## 2020-08-24 NOTE — Progress Notes (Signed)
Daily Session Note  Patient Details  Name: John [name] MRN: [id number] Date of Birth: 1945/12/06 Referring Provider:   Flowsheet Row CARDIAC REHAB PHASE II EXERCISE from 07/04/2020 in [facility]  Referring Provider taylor      Encounter Date: 08/24/2020  Check In:  Session Check In - 08/24/20 1445      Check-In   Supervising physician immediately available to respond to emergencies [facility] MD immediately available    Physician(s) Dr. Harl Bowie    Location [facility]    Staff Present Cathren Harsh, MS, Exercise Physiologist;Dalton Kris Mouton, MS, ACSM-CEP, Exercise Physiologist    Virtual Visit No    Medication changes reported     No    Fall or balance concerns reported    No    Tobacco Cessation No Change    Warm-up and Cool-down Performed as group-led instruction    Resistance Training Performed Yes    VAD Patient? No    PAD/SET Patient? No      Pain Assessment   Currently in Pain? No/denies    Multiple Pain Sites No           Capillary Blood Glucose: No results found for this or any previous visit (from the past 24 hour(s)).    Social History   Tobacco Use  Smoking Status Former Smoker  . Packs/day: 1.00  . Years: 10.00  . Pack years: 10.00  . Types: Cigarettes  . Quit date: 03/26/1986  . Years since quitting: 34.4  Smokeless Tobacco Never Used  Tobacco Comment   "stopped cigarette  smoking 1988"    Goals Met:  Independence with exercise equipment Exercise tolerated well No report of cardiac concerns or symptoms Strength training completed today  Goals Unmet:  Not Applicable  Comments: check out 1545   Dr. Kathie Dike is Medical Director for [facility].

## 2020-08-25 ENCOUNTER — Encounter: Payer: Self-pay | Admitting: "Endocrinology

## 2020-08-25 ENCOUNTER — Ambulatory Visit (INDEPENDENT_AMBULATORY_CARE_PROVIDER_SITE_OTHER): Payer: Medicare HMO | Admitting: "Endocrinology

## 2020-08-25 VITALS — BP 104/58 | HR 64 | Ht 72.0 in | Wt 197.4 lb

## 2020-08-25 DIAGNOSIS — E782 Mixed hyperlipidemia: Secondary | ICD-10-CM

## 2020-08-25 DIAGNOSIS — E1159 Type 2 diabetes mellitus with other circulatory complications: Secondary | ICD-10-CM | POA: Diagnosis not present

## 2020-08-25 DIAGNOSIS — I10 Essential (primary) hypertension: Secondary | ICD-10-CM

## 2020-08-25 LAB — POCT GLYCOSYLATED HEMOGLOBIN (HGB A1C): HbA1c, POC (controlled diabetic range): 9.9 % — AB (ref 0.0–7.0)

## 2020-08-25 MED ORDER — TRESIBA FLEXTOUCH 100 UNIT/ML ~~LOC~~ SOPN: 20.0000 [IU] | PEN_INJECTOR | Freq: Every day | SUBCUTANEOUS | 1 refills | Status: DC

## 2020-08-25 NOTE — Progress Notes (Signed)
Cardiac Individual Treatment Plan  Patient Details  Name: John [name] MRN: [id number] Date of Birth: October 18, 1945 Referring Provider:   Flowsheet Row CARDIAC REHAB PHASE II EXERCISE from 07/04/2020 in [facility]  Referring Provider taylor      Initial Encounter Date:  [place] PHASE II EXERCISE from 07/04/2020 in [facility]  Date 07/04/20      Visit Diagnosis: S/P TAVR (transcatheter aortic valve replacement)  Patient's Home Medications on Admission:  Current Outpatient Medications:  .  KLOR-CON M20 20 MEQ tablet, TAKE 1.5 TABLETS (30 MEQ TOTAL) BY MOUTH 2 (TWO) TIMES DAILY., Disp: 270 tablet, Rfl: 3 .  ACCU-CHEK AVIVA PLUS test strip, TEST BLOOD SUGAR TWICE DAILY BEFORE BREAKFAST AND AT BEDTIME, Disp: 200 strip, Rfl: 1 .  acetaminophen (TYLENOL) 325 MG tablet, Take 2 tablets (650 mg total) by mouth every 4 (four) hours as needed for headache or mild pain., Disp: , Rfl:  .  allopurinol (ZYLOPRIM) 300 MG tablet, Take 0.5 tablets (150 mg total) by mouth daily., Disp: 15 tablet, Rfl: 6 .  apixaban (ELIQUIS) 5 MG TABS tablet, Take 1 tablet (5 mg total) by mouth 2 (two) times daily., Disp: 180 tablet, Rfl: 3 .  aspirin 81 MG chewable tablet, Chew 1 tablet (81 mg total) by mouth daily., Disp: , Rfl:  .  benzonatate (TESSALON) 100 MG capsule, Take 1 capsule (100 mg total) by mouth 3 (three) times daily as needed for cough., Disp: 90 capsule, Rfl: 3 .  carvedilol (COREG) 3.125 MG tablet, TAKE 1 TABLET IN THE MORNING AND 2 TABLETS IN THE EVENING, Disp: 270 tablet, Rfl: 6 .  colchicine 0.6 MG tablet, Take 1 tablet (0.6 mg total) by mouth daily as needed., Disp: 30 tablet, Rfl: 6 .  dapagliflozin propanediol (FARXIGA) 10 MG TABS tablet, Take 1 tablet (10 mg total) by mouth daily before breakfast., Disp: 30 tablet, Rfl: 3 .  diphenoxylate-atropine (LOMOTIL) 2.5-0.025 MG tablet, Take 1 tablet by mouth 4 (four) times daily as needed  for diarrhea or loose stools., Disp: , Rfl:  .  Insulin Pen Needle (BD PEN NEEDLE NANO U/F) 32G X 4 MM MISC, 1 each by Does not apply route 4 (four) times daily., Disp: 150 each, Rfl: 5 .  metolazone (ZAROXOLYN) 2.5 MG tablet, Take 1 tablet (2.5 mg total) by mouth as directed. For wight of 200 lb and greater, Disp: 5 tablet, Rfl: 3 .  Multiple Vitamins-Minerals (ONE-A-DAY MENS 50+) TABS, Take 1 tablet by mouth daily with breakfast., Disp: , Rfl:  .  spironolactone (ALDACTONE) 25 MG tablet, Take 0.5 tablets (12.5 mg total) by mouth daily., Disp: 15 tablet, Rfl: 6 .  torsemide (DEMADEX) 20 MG tablet, Take 3 tablets (60 mg total) by mouth 2 (two) times daily., Disp: 180 tablet, Rfl: 3 .  TRULICITY 1.5 GY/1.8HU SOPN, INJECT 1.5MG (1 PEN) SUBCUTANEOUSLY EVERY WEEK, Disp: 2 mL, Rfl: 2  Past Medical History: Past Medical History:  Diagnosis Date  . Atrial flutter ([place]) 12/2010   Admitted with symptomatic bradycardia (HR 40s), atrial flutter with slow ventricular response 12/2010 + volume overload; AV nodal agents d/c'd and Pradaxa started; RFA in 01/2011  . CHF (congestive heart failure) ([place])   . Chronic combined systolic and diastolic heart failure ([place])    a. echo 01/06/11: mild LVH, EF 65-70%, mod to severe LAE, mild RVE, mild RAE, PASP 32;   TEE 10/12: EF 45-50% b. EF 45 to 50% by echo in 07/2018  .  Class 2 severe obesity due to excess calories with serious comorbidity and body mass index (BMI) of 37.0 to 37.9 in adult ([place]) 07/17/2017  . Diabetes mellitus    non insulin dependant  . Hematoma, chest wall 05/15/2020  . Hyperlipidemia   . Hypertension   . Osteoarthritis   . 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 Presence of permanent cardiac pacemaker   . PSVT (paroxysmal supraventricular tachycardia) ([facility])    Possibly atrial flutter  . S/P TAVR (transcatheter aortic valve replacement) 05/03/2020   s/p TAVR with a 29 mm Edwards S3U via the subclavian approach with Dr. Angelena Form & Dr. Cyndia Bent   . Severe  aortic stenosis     Tobacco Use: Social History   Tobacco Use  Smoking Status Former Smoker  . Packs/day: 1.00  . Years: 10.00  . Pack years: 10.00  . Types: Cigarettes  . Quit date: 03/26/1986  . Years since quitting: 34.4  Smokeless Tobacco Never Used  Tobacco Comment   "stopped cigarette  smoking 1988"    Labs: Recent Review Flowsheet Data    Labs for ITP Cardiac and Pulmonary Rehab Latest Ref Rng & Units 05/11/2020 05/12/2020 05/13/2020 05/14/2020 05/15/2020   Cholestrol 0 - 200 mg/dL - - - - -   LDLCALC 0 - 99 mg/dL - - - - -   HDL >40 mg/dL - - - - -   Trlycerides <150 mg/dL - - - - -   Hemoglobin A1c 4.8 - 5.6 % - - - - -   PHART 7.350 - 7.450 - - - - -   PCO2ART 32.0 - 48.0 mmHg - - - - -   HCO3 20.0 - 28.0 mmol/L - - - - -   TCO2 22 - 32 mmol/L - - - - -   O2SAT % 69.2 67.9 60.2 73.3 65.9      Capillary Blood Glucose: Lab Results  Component Value Date   GLUCAP 396 (H) 06/30/2020   GLUCAP 179 (H) 06/08/2020   GLUCAP 200 (H) 05/15/2020   GLUCAP 253 (H) 05/15/2020   GLUCAP 136 (H) 05/15/2020    POCT Glucose    Row Name 06/30/20 1311 07/04/20 1416           POCT Blood Glucose   Pre-Exercise 396 mg/dL --      Pre-Exercise #2 -- 158 mg/dL             Exercise Target Goals: Exercise Program Goal: Individual exercise prescription set using results from initial 6 min walk test and THRR while considering  patient's activity barriers and safety.   Exercise Prescription Goal: Starting with aerobic activity 30 plus minutes a day, 3 days per week for initial exercise prescription. Provide home exercise prescription and guidelines that participant acknowledges understanding prior to discharge.  Activity Barriers & Risk Stratification:  Activity Barriers & Cardiac Risk Stratification - 06/30/20 1240      Activity Barriers & Cardiac Risk Stratification   Activity Barriers Arthritis;Deconditioning;Decreased Ventricular Function    Cardiac Risk Stratification High            6 Minute Walk:  6 Minute Walk    Row Name 07/04/20 1442         6 Minute Walk   Phase Initial     Distance 600 feet     Walk Time 6 minutes     # of Rest Breaks 2     MPH 1.1     METS 1.28     RPE 12  VO2 Peak 4.49     Symptoms Yes (comment)     Comments left knee pain 3/10     Resting HR 66 bpm     Resting BP 108/56     Resting Oxygen Saturation  98 %     Exercise Oxygen Saturation  during 6 min walk 98 %     Max Ex. HR 87 bpm     Max Ex. BP 122/54     2 Minute Post BP 118/60            Oxygen Initial Assessment:   Oxygen Re-Evaluation:   Oxygen Discharge (Final Oxygen Re-Evaluation):   Initial Exercise Prescription:  Initial Exercise Prescription - 07/04/20 1400      Date of Initial Exercise RX and Referring Provider   Date 07/04/20    Referring Provider taylor    Expected Discharge Date 09/21/20      NuStep   Level 1    SPM 60    Minutes 22      Arm Ergometer   Level 1    RPM 40    Minutes 17      Prescription Details   Frequency (times per week) 3    Duration Progress to 30 minutes of continuous aerobic without signs/symptoms of physical distress      Intensity   THRR 40-80% of Max Heartrate 58-117    Ratings of Perceived Exertion 11-13      Progression   Progression Continue to progress workloads to maintain intensity without signs/symptoms of physical distress.      Resistance Training   Training Prescription Yes    Weight 3    Reps 10-15           Perform Capillary Blood Glucose checks as needed.  Exercise Prescription Changes:  Exercise Prescription Changes    Row Name 07/11/20 1500 07/15/20 1505 08/08/20 1500 08/17/20 1152       Response to Exercise   Blood Pressure (Admit) 98/70 122/60 96/52 102/50    Blood Pressure (Exercise) 140/60 110/58 116/64 124/64    Blood Pressure (Exit) 124/62 114/68 110/70 100/60    Heart Rate (Admit) 62 bpm 87 bpm 78 bpm 64 bpm    Heart Rate (Exercise) 99 bpm 89 bpm 87 bpm  103 bpm    Heart Rate (Exit) 68 bpm 79 bpm 74 bpm 73 bpm    Rating of Perceived Exertion (Exercise) 13 13 13 15     Duration Continue with 30 min of aerobic exercise without signs/symptoms of physical distress. Continue with 30 min of aerobic exercise without signs/symptoms of physical distress. Continue with 30 min of aerobic exercise without signs/symptoms of physical distress. Continue with 30 min of aerobic exercise without signs/symptoms of physical distress.    Intensity THRR unchanged THRR unchanged THRR unchanged THRR unchanged         Progression   Progression Continue to progress workloads to maintain intensity without signs/symptoms of physical distress. Continue to progress workloads to maintain intensity without signs/symptoms of physical distress. Continue to progress workloads to maintain intensity without signs/symptoms of physical distress. Continue to progress workloads to maintain intensity without signs/symptoms of physical distress.         Resistance Training   Training Prescription Yes Yes Yes Yes    Weight 3 lbs 3 lb 3 lbs 3 lbs    Reps 10-15 10-15 10-15 10-15    Time 10 Minutes 10 Minutes 10 Minutes 10 Minutes  NuStep   Level 1 1 2 2     SPM 82 83 71 101    Minutes 17 17 17 17     METs 1.9 1.9 1.8 1.9         Arm Ergometer   Level 1 1 1 1     RPM 34 43 90 38    Minutes 22 22 22 22     METs 1.4 1.5 1.48 1.6           Exercise Comments:   Exercise Goals and Review:  Exercise Goals    Row Name 07/04/20 1448 07/25/20 1507 08/23/20 1153         Exercise Goals   Increase Physical Activity Yes Yes Yes     Intervention Develop an individualized exercise prescription for aerobic and resistive training based on initial evaluation findings, risk stratification, comorbidities and participant's personal goals.;Provide advice, education, support and counseling about physical activity/exercise needs. Develop an individualized exercise prescription for aerobic  and resistive training based on initial evaluation findings, risk stratification, comorbidities and participant's personal goals.;Provide advice, education, support and counseling about physical activity/exercise needs. Develop an individualized exercise prescription for aerobic and resistive training based on initial evaluation findings, risk stratification, comorbidities and participant's personal goals.;Provide advice, education, support and counseling about physical activity/exercise needs.     Expected Outcomes Short Term: Attend rehab on a regular basis to increase amount of physical activity.;Long Term: Add in home exercise to make exercise part of routine and to increase amount of physical activity.;Long Term: Exercising regularly at least 3-5 days a week. Short Term: Attend rehab on a regular basis to increase amount of physical activity.;Long Term: Add in home exercise to make exercise part of routine and to increase amount of physical activity.;Long Term: Exercising regularly at least 3-5 days a week. Short Term: Attend rehab on a regular basis to increase amount of physical activity.;Long Term: Add in home exercise to make exercise part of routine and to increase amount of physical activity.;Long Term: Exercising regularly at least 3-5 days a week.     Increase Strength and Stamina Yes Yes Yes     Intervention Provide advice, education, support and counseling about physical activity/exercise needs.;Develop an individualized exercise prescription for aerobic and resistive training based on initial evaluation findings, risk stratification, comorbidities and participant's personal goals. Provide advice, education, support and counseling about physical activity/exercise needs.;Develop an individualized exercise prescription for aerobic and resistive training based on initial evaluation findings, risk stratification, comorbidities and participant's personal goals. Provide advice, education, support and  counseling about physical activity/exercise needs.;Develop an individualized exercise prescription for aerobic and resistive training based on initial evaluation findings, risk stratification, comorbidities and participant's personal goals.     Expected Outcomes Short Term: Increase workloads from initial exercise prescription for resistance, speed, and METs.;Short Term: Perform resistance training exercises routinely during rehab and add in resistance training at home;Long Term: Improve cardiorespiratory fitness, muscular endurance and strength as measured by increased METs and functional capacity (6MWT) Short Term: Increase workloads from initial exercise prescription for resistance, speed, and METs.;Short Term: Perform resistance training exercises routinely during rehab and add in resistance training at home;Long Term: Improve cardiorespiratory fitness, muscular endurance and strength as measured by increased METs and functional capacity (6MWT) Short Term: Increase workloads from initial exercise prescription for resistance, speed, and METs.;Short Term: Perform resistance training exercises routinely during rehab and add in resistance training at home;Long Term: Improve cardiorespiratory fitness, muscular endurance and strength as measured by increased METs and functional  capacity (6MWT)     Able to understand and use rate of perceived exertion (RPE) scale Yes Yes Yes     Intervention Provide education and explanation on how to use RPE scale Provide education and explanation on how to use RPE scale Provide education and explanation on how to use RPE scale     Expected Outcomes Short Term: Able to use RPE daily in rehab to express subjective intensity level;Long Term:  Able to use RPE to guide intensity level when exercising independently Short Term: Able to use RPE daily in rehab to express subjective intensity level;Long Term:  Able to use RPE to guide intensity level when exercising independently Short  Term: Able to use RPE daily in rehab to express subjective intensity level;Long Term:  Able to use RPE to guide intensity level when exercising independently     Knowledge and understanding of Target Heart Rate Range (THRR) Yes Yes Yes     Intervention Provide education and explanation of THRR including how the numbers were predicted and where they are located for reference Provide education and explanation of THRR including how the numbers were predicted and where they are located for reference Provide education and explanation of THRR including how the numbers were predicted and where they are located for reference     Expected Outcomes Short Term: Able to state/look up THRR;Long Term: Able to use THRR to govern intensity when exercising independently;Short Term: Able to use daily as guideline for intensity in rehab Short Term: Able to state/look up THRR;Long Term: Able to use THRR to govern intensity when exercising independently;Short Term: Able to use daily as guideline for intensity in rehab Short Term: Able to state/look up THRR;Long Term: Able to use THRR to govern intensity when exercising independently;Short Term: Able to use daily as guideline for intensity in rehab     Able to check pulse independently Yes Yes Yes     Intervention Provide education and demonstration on how to check pulse in carotid and radial arteries.;Review the importance of being able to check your own pulse for safety during independent exercise Provide education and demonstration on how to check pulse in carotid and radial arteries.;Review the importance of being able to check your own pulse for safety during independent exercise Provide education and demonstration on how to check pulse in carotid and radial arteries.;Review the importance of being able to check your own pulse for safety during independent exercise     Expected Outcomes Short Term: Able to explain why pulse checking is important during independent  exercise;Long Term: Able to check pulse independently and accurately Short Term: Able to explain why pulse checking is important during independent exercise;Long Term: Able to check pulse independently and accurately Short Term: Able to explain why pulse checking is important during independent exercise;Long Term: Able to check pulse independently and accurately     Understanding of Exercise Prescription Yes Yes Yes     Intervention Provide education, explanation, and written materials on patient's individual exercise prescription Provide education, explanation, and written materials on patient's individual exercise prescription Provide education, explanation, and written materials on patient's individual exercise prescription     Expected Outcomes Long Term: Able to explain home exercise prescription to exercise independently;Short Term: Able to explain program exercise prescription Long Term: Able to explain home exercise prescription to exercise independently;Short Term: Able to explain program exercise prescription Long Term: Able to explain home exercise prescription to exercise independently;Short Term: Able to explain program exercise prescription  Exercise Goals Re-Evaluation :  Exercise Goals Re-Evaluation    Row Name 07/15/20 1507 08/23/20 1153           Exercise Goal Re-Evaluation   Exercise Goals Review Increase Strength and Stamina;Increase Physical Activity;Able to understand and use rate of perceived exertion (RPE) scale;Knowledge and understanding of Target Heart Rate Range (THRR);Able to check pulse independently;Understanding of Exercise Prescription Increase Strength and Stamina;Increase Physical Activity;Able to understand and use rate of perceived exertion (RPE) scale;Knowledge and understanding of Target Heart Rate Range (THRR);Able to check pulse independently;Understanding of Exercise Prescription      Comments Patient has completed 5 exercise sessions. He has been  tolerating exercise well. He has been progressing slowly. He has had inconsistent attendance. He is currently exercising at 1.9 METs on the NuStep. He is very positive in class and has a bright outlook on his goals. Will continue to monitor and progress as able. Patient has completed 10 exercise sessions. He has tolerated exercise well with little progressions. He has some trouble with his shoulders during the resistance training sessions. I give him alternative exercises to complete for ones that bother his shoulder. His balance is also unstable as well as his lower body strength. Modifications are made to exercise prescription to accomodate. He is currently exercising at 1.9 METs on the NuStep. Will continue to monitor and progress as able.      Expected Outcomes Throughout exercise at rehab and at home patient will achieve their goals. Throughout exercise at rehab and at home patient will achieve their goals.              Discharge Exercise Prescription (Final Exercise Prescription Changes):  Exercise Prescription Changes - 08/17/20 1152      Response to Exercise   Blood Pressure (Admit) 102/50    Blood Pressure (Exercise) 124/64    Blood Pressure (Exit) 100/60    Heart Rate (Admit) 64 bpm    Heart Rate (Exercise) 103 bpm    Heart Rate (Exit) 73 bpm    Rating of Perceived Exertion (Exercise) 15    Duration Continue with 30 min of aerobic exercise without signs/symptoms of physical distress.    Intensity THRR unchanged      Progression   Progression Continue to progress workloads to maintain intensity without signs/symptoms of physical distress.      Resistance Training   Training Prescription Yes    Weight 3 lbs    Reps 10-15    Time 10 Minutes      NuStep   Level 2    SPM 101    Minutes 17    METs 1.9      Arm Ergometer   Level 1    RPM 38    Minutes 22    METs 1.6           Nutrition:  Target Goals: Understanding of nutrition guidelines, daily intake of sodium  <1514m, cholesterol <2027m calories 30% from fat and 7% or less from saturated fats, daily to have 5 or more servings of fruits and vegetables.  Biometrics:  Pre Biometrics - 08/08/20 1552      Pre Biometrics   Weight 87.6 kg            Nutrition Therapy Plan and Nutrition Goals:  Nutrition Therapy & Goals - 07/20/20 0953      Personal Nutrition Goals   Comments Patient scored 22 on his diet assessment. We provide 2 educational sessions with handout on heart healthy nutrition and  offer RD referral if patient is interested.      Intervention Plan   Intervention Nutrition handout(s) given to patient.           Nutrition Assessments:  Nutrition Assessments - 06/30/20 1243      MEDFICTS Scores   Pre Score 23          MEDIFICTS Score Key:  ?70 Need to make dietary changes   40-70 Heart Healthy Diet  ? 40 Therapeutic Level Cholesterol Diet   Picture Your Plate Scores:  <92 Unhealthy dietary pattern with much room for improvement.  41-50 Dietary pattern unlikely to meet recommendations for good health and room for improvement.  51-60 More healthful dietary pattern, with some room for improvement.   >60 Healthy dietary pattern, although there may be some specific behaviors that could be improved.    Nutrition Goals Re-Evaluation:   Nutrition Goals Discharge (Final Nutrition Goals Re-Evaluation):   Psychosocial: Target Goals: Acknowledge presence or absence of significant depression and/or stress, maximize coping skills, provide positive support system. Participant is able to verbalize types and ability to use techniques and skills needed for reducing stress and depression.  Initial Review & Psychosocial Screening:  Initial Psych Review & Screening - 06/30/20 1241      Initial Review   Current issues with None Identified      Family Dynamics   Good Support System? Yes    Comments His wife is his support system.      Barriers   Psychosocial barriers  to participate in program There are no identifiable barriers or psychosocial needs.      Screening Interventions   Interventions Encouraged to exercise    Expected Outcomes Long Term Goal: Stressors or current issues are controlled or eliminated.;Long Term goal: The participant improves quality of Life and PHQ9 Scores as seen by post scores and/or verbalization of changes           Quality of Life Scores:  Quality of Life - 07/04/20 1448      Quality of Life   Select Quality of Life      Quality of Life Scores   Health/Function Pre 16.9 %    Socioeconomic Pre 27.58 %    Psych/Spiritual Pre 26.5 %    Family Pre 13.5 %    GLOBAL Pre 20.58 %          Scores of 19 and below usually indicate a poorer quality of life in these areas.  A difference of  2-3 points is a clinically meaningful difference.  A difference of 2-3 points in the total score of the Quality of Life Index has been associated with significant improvement in overall quality of life, self-image, physical symptoms, and general health in studies assessing change in quality of life.  PHQ-9: Recent Review Flowsheet Data    Depression screen [facility] 2/9 06/30/2020 04/15/2020 01/23/2019 11/24/2018 11/06/2018   Decreased Interest 0 0 0 0 0   Down, Depressed, Hopeless 0 0 0 0 0   PHQ - 2 Score 0 0 0 0 0   Altered sleeping 0 - - - -   Tired, decreased energy 1 - - - -   Change in appetite 0 - - - -   Feeling bad or failure about yourself  0 - - - -   Trouble concentrating 1 - - - -   Moving slowly or fidgety/restless 1 - - - -   Suicidal thoughts 0 - - - -   PHQ-9  Score 3 - - - -   Difficult doing work/chores Somewhat difficult - - - -     Interpretation of Total Score  Total Score Depression Severity:  1-4 = Minimal depression, 5-9 = Mild depression, 10-14 = Moderate depression, 15-19 = Moderately severe depression, 20-27 = Severe depression   Psychosocial Evaluation and Intervention:  Psychosocial Evaluation - 06/30/20  1306      Psychosocial Evaluation & Interventions   Interventions Encouraged to exercise with the program and follow exercise prescription    Comments Pt has no identifiable barriers to completing rehab. He has no identifiable psychosocial issues, and his PHQ-9 score is a 3. He was not very talkative, so it was hard to pull information from him. He states that his wife is his support system, and did not list anyone else, nor go into any detail about this. He states that his goal in the program is to get stronger. He was unable to do the physical portion of his orientation due to his blood sugar being 396. He states that this is uncommon.    Expected Outcomes The patient will continue to not have any identifiable psychosocial issues.    Continue Psychosocial Services  No Follow up required           Psychosocial Re-Evaluation:  Psychosocial Re-Evaluation    [place] Name 07/20/20 [phone] 08/12/20 1309           Psychosocial Re-Evaluation   Current issues with None Identified None Identified      Comments Patient is new to the program completing 6 sessions. He continues to have no psychosocial issues identified. Will continue to monitor. Patient continues to have no psychosocial issues identified. He has completed 9 sesions. Will continue to monitor.      Expected Outcomes Patient will continue to have no psychosocial issues identified. Patient will continue to have no psychosocial issues identified.      Interventions Stress management education;Encouraged to attend Cardiac Rehabilitation for the exercise;Relaxation education Stress management education;Encouraged to attend Cardiac Rehabilitation for the exercise;Relaxation education      Continue Psychosocial Services  No Follow up required No Follow up required             Psychosocial Discharge (Final Psychosocial Re-Evaluation):  Psychosocial Re-Evaluation - 08/12/20 1309      Psychosocial Re-Evaluation   Current issues with None  Identified    Comments Patient continues to have no psychosocial issues identified. He has completed 9 sesions. Will continue to monitor.    Expected Outcomes Patient will continue to have no psychosocial issues identified.    Interventions Stress management education;Encouraged to attend Cardiac Rehabilitation for the exercise;Relaxation education    Continue Psychosocial Services  No Follow up required           Vocational Rehabilitation: Provide vocational rehab assistance to qualifying candidates.   Vocational Rehab Evaluation & Intervention:  Vocational Rehab - 06/30/20 1243      Initial Vocational Rehab Evaluation & Intervention   Assessment shows need for Vocational Rehabilitation No      Vocational Rehab Re-Evaulation   Comments He is retired and has no desire to return to work           Education: Education Goals: Education classes will be provided on a weekly basis, covering required topics. Participant will state understanding/return demonstration of topics presented.  Learning Barriers/Preferences:  Learning Barriers/Preferences - 06/30/20 1247      Learning Barriers/Preferences   Learning Barriers None  Learning Preferences Audio;Video;Written Material;Skilled Demonstration           Education Topics: Hypertension, Hypertension Reduction -Define heart disease and high blood pressure. Discus how high blood pressure affects the body and ways to reduce high blood pressure.   Exercise and Your Heart -Discuss why it is important to exercise, the FITT principles of exercise, normal and abnormal responses to exercise, and how to exercise safely. Flowsheet Row CARDIAC REHAB PHASE II EXERCISE from 08/24/2020 in [place]  Date 07/06/20  Educator mk  Instruction Review Code 2- Demonstrated Understanding      Angina -Discuss definition of angina, causes of angina, treatment of angina, and how to decrease risk of having  angina. Flowsheet Row CARDIAC REHAB PHASE II EXERCISE from 08/24/2020 in [place]  Date 07/13/20  Educator mk  Instruction Review Code 2- Demonstrated Understanding      Cardiac Medications -Review what the following cardiac medications are used for, how they affect the body, and side effects that may occur when taking the medications.  Medications include Aspirin, Beta blockers, calcium channel blockers, ACE Inhibitors, angiotensin receptor blockers, diuretics, digoxin, and antihyperlipidemics.   Congestive Heart Failure -Discuss the definition of CHF, how to live with CHF, the signs and symptoms of CHF, and how keep track of weight and sodium intake.   Heart Disease and Intimacy -Discus the effect sexual activity has on the heart, how changes occur during intimacy as we age, and safety during sexual activity.   Smoking Cessation / COPD -Discuss different methods to quit smoking, the health benefits of quitting smoking, and the definition of COPD. Flowsheet Row CARDIAC REHAB PHASE II EXERCISE from 08/24/2020 in [place]  Date 08/10/20  Educator mk  Instruction Review Code 2- Demonstrated Understanding      Nutrition I: Fats -Discuss the types of cholesterol, what cholesterol does to the heart, and how cholesterol levels can be controlled. Flowsheet Row CARDIAC REHAB PHASE II EXERCISE from 08/24/2020 in [place]  Date 08/17/20  Educator DJ  Instruction Review Code 1- Verbalizes Understanding      Nutrition II: Labels -Discuss the different components of food labels and how to read food label [place] from 08/24/2020 in [place]  Date 08/24/20  Educator mk  Instruction Review Code 2- Demonstrated Understanding      Heart Parts/Heart Disease and PAD -Discuss the anatomy of the heart, the pathway of blood circulation through the heart, and these  are affected by heart disease.   Stress I: Signs and Symptoms -Discuss the causes of stress, how stress may lead to anxiety and depression, and ways to limit stress.   Stress II: Relaxation -Discuss different types of relaxation techniques to limit stress.   Warning Signs of Stroke / TIA -Discuss definition of a stroke, what the signs and symptoms are of a stroke, and how to identify when someone is having stroke.   Knowledge Questionnaire Score:  Knowledge Questionnaire Score - 06/30/20 1247      Knowledge Questionnaire Score   Pre Score 15/24           Core Components/Risk Factors/Patient Goals at Admission:  Personal Goals and Risk Factors at Admission - 06/30/20 1250      Core Components/Risk Factors/Patient Goals on Admission   Improve shortness of breath with ADL's Yes    Intervention Provide education, individualized exercise plan and daily activity instruction to help decrease symptoms of  SOB with activities of daily living.    Expected Outcomes Short Term: Improve cardiorespiratory fitness to achieve a reduction of symptoms when performing ADLs;Long Term: Be able to perform more ADLs without symptoms or delay the onset of symptoms    Diabetes Yes    Intervention Provide education about signs/symptoms and action to take for hypo/hyperglycemia.;Provide education about proper nutrition, including hydration, and aerobic/resistive exercise prescription along with prescribed medications to achieve blood glucose in normal ranges: Fasting glucose 65-99 mg/dL    Expected Outcomes Short Term: Participant verbalizes understanding of the signs/symptoms and immediate care of hyper/hypoglycemia, proper foot care and importance of medication, aerobic/resistive exercise and nutrition plan for blood glucose control.;Long Term: Attainment of HbA1C < 7%.    Heart Failure Yes    Intervention Provide a combined exercise and nutrition program that is supplemented with education, support and  counseling about heart failure. Directed toward relieving symptoms such as shortness of breath, decreased exercise tolerance, and extremity edema.    Expected Outcomes Improve functional capacity of life;Short term: Attendance in program 2-3 days a week with increased exercise capacity. Reported lower sodium intake. Reported increased fruit and vegetable intake. Reports medication compliance.;Short term: Daily weights obtained and reported for increase. Utilizing diuretic protocols set by physician.;Long term: Adoption of self-care skills and reduction of barriers for early signs and symptoms recognition and intervention leading to self-care maintenance.           Core Components/Risk Factors/Patient Goals Review:   Goals and Risk Factor Review    Row Name 07/20/20 0955 08/12/20 1309           Core Components/Risk Factors/Patient Goals Review   Personal Goals Review Weight Management/Obesity;Diabetes Weight Management/Obesity;Diabetes      Review Patient is new to the program completing 6 sessions. He was referred to CR with [facility]. He has multiple risk factors for CAD and is participating in the program for risk modification. His last A1C on file was 05/03/20 at 5.7%. His blood pressure is well controlled. His personal goals for the program are to get stronger. We will continue to monitor his progress as he works toward meeting these goals. Patient has completed 9 sessions. He has missed several sessions due to being in volume overload. Dr. Haroldine Laws did not want him to return to CR until he got him back to euvolemic. He returned  08/05/20 and is doing well in the program. He says he still feels weak at times but feels better than he did. His personal goal for the program is to get stronger. We will continue to monitor him as he works toward meeting this goal.      Expected Outcomes Patient will complete the program meeting both personal and program goals. Patient will complete the program meeting both  personal and program goals.             Core Components/Risk Factors/Patient Goals at Discharge (Final Review):   Goals and Risk Factor Review - 08/12/20 1309      Core Components/Risk Factors/Patient Goals Review   Personal Goals Review Weight Management/Obesity;Diabetes    Review Patient has completed 9 sessions. He has missed several sessions due to being in volume overload. Dr. Haroldine Laws did not want him to return to CR until he got him back to euvolemic. He returned  08/05/20 and is doing well in the program. He says he still feels weak at times but feels better than he did. His personal goal for the program is to get stronger.  We will continue to monitor him as he works toward meeting this goal.    Expected Outcomes Patient will complete the program meeting both personal and program goals.           ITP Comments:   Comments: ITP REVIEW Pt is making expected progress toward Cardiac Rehab goals after completing 13 sessions. Recommend continued exercise, life style modification, education, and increased stamina and strength.

## 2020-08-25 NOTE — Progress Notes (Signed)
08/25/2020, 3:25 PM   Endocrinology follow-up note    Subjective:    Patient ID: John [name], male    DOB: 03/19/1946.  John [name] is here to follow-up for the management of his currently uncontrolled type 2 diabetes, hyperlipidemia.    PMD:  Erven Colla, DO.   Past Medical History:  Diagnosis Date  . Atrial flutter ([place]) 12/2010   Admitted with symptomatic bradycardia (HR 40s), atrial flutter with slow ventricular response 12/2010 + volume overload; AV nodal agents d/c'd and Pradaxa started; RFA in 01/2011  . CHF (congestive heart failure) ([place])   . Chronic combined systolic and diastolic heart failure ([place])    a. echo 01/06/11: mild LVH, EF 65-70%, mod to severe LAE, mild RVE, mild RAE, PASP 32;   TEE 10/12: EF 45-50% b. EF 45 to 50% by echo in 07/2018  . Class 2 severe obesity due to excess calories with serious comorbidity and body mass index (BMI) of 37.0 to 37.9 in adult ([place]) 07/17/2017  . Diabetes mellitus    non insulin dependant  . Hematoma, chest wall 05/15/2020  . Hyperlipidemia   . Hypertension   . Osteoarthritis   . 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 Presence of permanent cardiac pacemaker   . PSVT (paroxysmal supraventricular tachycardia) ([facility])    Possibly atrial flutter  . S/P TAVR (transcatheter aortic valve replacement) 05/03/2020   s/p TAVR with a 29 mm Edwards S3U via the subclavian approach with Dr. Angelena Form & Dr. Cyndia Bent   . Severe aortic stenosis    Past Surgical History:  Procedure Laterality Date  . ATRIAL FLUTTER ABLATION N/A 02/07/2011   Procedure: ATRIAL FLUTTER ABLATION;  Surgeon: Evans Lance, MD;  Location: [facility] CATH LAB;  Service: Cardiovascular;  Laterality: N/A;  . BIV UPGRADE N/A 06/08/2020   Procedure: BIV PPM UPGRADE;  Surgeon: Evans Lance, MD;  Location: [place] CV LAB;  Service: Cardiovascular;  Laterality: N/A;  . CARDIAC  ELECTROPHYSIOLOGY STUDY AND ABLATION  02/07/11  . CARDIOVERSION N/A 03/23/2016   Procedure: CARDIOVERSION;  Surgeon: Evans Lance, MD;  Location: [place];  Service: Cardiovascular;  Laterality: N/A;  . COLONOSCOPY N/A 04/17/2017   Procedure: COLONOSCOPY;  Surgeon: Rogene Houston, MD;  Location[facility];  Service: Endoscopy;  Laterality: N/A;  830  . EP IMPLANTABLE DEVICE N/A 08/29/2015   Procedure: Pacemaker Implant;  Surgeon: Evans Lance, MD;  Location: [place] CV LAB;  Service: Cardiovascular;  Laterality: N/A;  . EP IMPLANTABLE DEVICE N/A 12/07/2015   Procedure: PPM Lead Revision/Repair;  Surgeon: Evans Lance, MD;  Location: [place] CV LAB;  Service: Cardiovascular;  Laterality: N/A;  . KNEE ARTHROSCOPY  2005   left  . LUMBAR SPINE SURGERY     "I've had 6 ORs 1972 thru 2004"  . POLYPECTOMY  04/17/2017   Procedure: POLYPECTOMY;  Surgeon: Rogene Houston, MD;  [facility];  Service: Endoscopy;;  transverse colon x3;  . RIGHT HEART CATH N/A 04/28/2020   Procedure: RIGHT HEART CATH;  Surgeon: Jolaine Artist, MD;  Location:  [place] INVASIVE CV LAB;  Service: Cardiovascular;  Laterality: N/A;  . RIGHT/LEFT HEART CATH AND CORONARY ANGIOGRAPHY N/A 12/21/2019   Procedure: RIGHT/LEFT HEART CATH AND CORONARY ANGIOGRAPHY;  Surgeon: Nelva Bush, MD;  Location: [place] CV LAB;  Service: Cardiovascular;  Laterality: N/A;  . TEE WITHOUT CARDIOVERSION N/A 11/05/2019   Procedure: TRANSESOPHAGEAL ECHOCARDIOGRAM (TEE) WITH PROPOFOL;  Surgeon: Arnoldo Lenis, MD;  [facility];  Service: Endoscopy;  Laterality: N/A;  . TEE WITHOUT CARDIOVERSION N/A 05/03/2020   Procedure: TRANSESOPHAGEAL ECHOCARDIOGRAM (TEE);  Surgeon: Burnell Blanks, MD;  Location: [place];  Service: Open Heart Surgery;  Laterality: N/A;   Social History   Socioeconomic History  . Marital status: Married    Spouse name: Not on file  . Number of children: 2  . Years of  education: Not on file  . Highest education level: Not on file  Occupational History  . Occupation: Immunologist: RETIRED  Tobacco Use  . Smoking status: Former Smoker    Packs/day: 1.00    Years: 10.00    Pack years: 10.00    Types: Cigarettes    Quit date: 03/26/1986    Years since quitting: 34.4  . Smokeless tobacco: Never Used  . Tobacco comment: "stopped cigarette  smoking 1988"  Vaping Use  . Vaping Use: Never used  Substance and Sexual Activity  . Alcohol use: Not Currently    Alcohol/week: 0.0 standard drinks    Comment: "quit alcohol ~ 2007"  . Drug use: No  . Sexual activity: Yes    Partners: Female  Other Topics Concern  . Not on file  Social History Narrative  . Not on file   Social Determinants of Health   Financial Resource Strain: Not on file  Food Insecurity: Not on file  Transportation Needs: Not on file  Physical Activity: Not on file  Stress: Not on file  Social Connections: Not on file   Outpatient Encounter Medications as of 08/25/2020  Medication Sig  . insulin degludec (TRESIBA FLEXTOUCH) 100 UNIT/ML FlexTouch Pen Inject 20 Units into the skin daily.  Marland Kitchen KLOR-CON M20 20 MEQ tablet TAKE 1.5 TABLETS (30 MEQ TOTAL) BY MOUTH 2 (TWO) TIMES DAILY.  Marland Kitchen ACCU-CHEK AVIVA PLUS test strip TEST BLOOD SUGAR TWICE DAILY BEFORE BREAKFAST AND AT BEDTIME  . acetaminophen (TYLENOL) 325 MG tablet Take 2 tablets (650 mg total) by mouth every 4 (four) hours as needed for headache or mild pain.  Marland Kitchen allopurinol (ZYLOPRIM) 300 MG tablet Take 0.5 tablets (150 mg total) by mouth daily.  Marland Kitchen apixaban (ELIQUIS) 5 MG TABS tablet Take 1 tablet (5 mg total) by mouth 2 (two) times daily.  Marland Kitchen aspirin 81 MG chewable tablet Chew 1 tablet (81 mg total) by mouth daily.  . benzonatate (TESSALON) 100 MG capsule Take 1 capsule (100 mg total) by mouth 3 (three) times daily as needed for cough.  . carvedilol (COREG) 3.125 MG tablet TAKE 1 TABLET IN THE MORNING AND 2 TABLETS IN  THE EVENING  . colchicine 0.6 MG tablet Take 1 tablet (0.6 mg total) by mouth daily as needed.  . dapagliflozin propanediol (FARXIGA) 10 MG TABS tablet Take 1 tablet (10 mg total) by mouth daily before breakfast.  . diphenoxylate-atropine (LOMOTIL) 2.5-0.025 MG tablet Take 1 tablet by mouth 4 (four) times daily as needed for diarrhea or loose stools.  . Insulin Pen Needle (BD PEN NEEDLE NANO U/F) 32G X 4 MM MISC 1 each by Does not  apply route 4 (four) times daily.  . metolazone (ZAROXOLYN) 2.5 MG tablet Take 1 tablet (2.5 mg total) by mouth as directed. For wight of 200 lb and greater  . Multiple Vitamins-Minerals (ONE-A-DAY MENS 50+) TABS Take 1 tablet by mouth daily with breakfast.  . spironolactone (ALDACTONE) 25 MG tablet Take 0.5 tablets (12.5 mg total) by mouth daily.  Marland Kitchen torsemide (DEMADEX) 20 MG tablet Take 3 tablets (60 mg total) by mouth 2 (two) times daily.  . TRULICITY 1.5 YQ/0.3KV SOPN INJECT 1.5MG (1 PEN) SUBCUTANEOUSLY EVERY WEEK   No facility-administered encounter medications on file as of 08/25/2020.    ALLERGIES: No Known Allergies  VACCINATION STATUS: Immunization History  Administered Date(s) Administered  . Influenza,inj,Quad PF,6+ Mos 11/24/2014, 11/29/2015, 12/11/2016, 12/25/2017, 12/20/2018  . Influenza-Unspecified 11/24/2013, 12/25/2019  . Moderna Sars-Covid-2 Vaccination 05/03/2019, 06/03/2019, 01/23/2020  . Pneumococcal Conjugate-13 11/19/2013  . Pneumococcal Polysaccharide-23 11/29/2015  . Pneumococcal-Unspecified 03/24/2009  . Td 06/23/2010    Diabetes He presents for his follow-up (Telephone visit due to coronavirus pandemic) diabetic visit. He has type 2 diabetes mellitus. Onset time: He was diagnosed at approximate age of 70 years. His disease course has been improving. There are no hypoglycemic associated symptoms. Pertinent negatives for hypoglycemia include no confusion, pallor or seizures. There are no diabetic associated symptoms. Pertinent negatives  for diabetes include no fatigue, no polydipsia, no polyphagia, no polyuria and no weakness. There are no hypoglycemic complications. Symptoms are improving. Diabetic complications include heart disease and nephropathy. Risk factors for coronary artery disease include diabetes mellitus, dyslipidemia, family history, obesity, male sex, hypertension, sedentary lifestyle and tobacco exposure. Current diabetic treatments: He is on Trulicity 1.5 mg Septanest weekly, and Farxiga 10 mg p.o. daily. His weight is fluctuating minimally. He is following a generally unhealthy diet. When asked about meal planning, he reported none. He has not had a previous visit with a dietitian. He never participates in exercise. His home blood glucose trend is increasing steadily. His breakfast blood glucose range is generally >200 mg/dl. His overall blood glucose range is >200 mg/dl. (During his last virtual visit, A1c was 5.7% and he was reporting significant frequency of hypoglycemia.  This led to discontinuation of Tresiba.  However, more recently he is running hyperglycemia averaging 200 for the last 7 days, 182 for the last 14 days.  His point-of-care A1c is 9.9% today.  No recent hypoglycemia. ) An ACE inhibitor/angiotensin II receptor blocker is being taken. He does not see a podiatrist.Eye exam is current.  Hyperlipidemia This is a chronic problem. The current episode started more than 1 year ago. The problem is controlled. Exacerbating diseases include diabetes and obesity. Pertinent negatives include no myalgias. Current antihyperlipidemic treatment includes statins. Risk factors for coronary artery disease include diabetes mellitus, dyslipidemia, family history, obesity, male sex, hypertension and a sedentary lifestyle.     Review of systems  Constitutional: + He lost 30+ pounds.  He is recovering from cardiac surgery for aortic stenosis.   Current  Body mass index is 26.77 kg/m. Marland Kitchen    Objective:    Vitals with BMI  08/25/2020 08/08/2020 07/28/2020  Height 6' 0"  - -  Weight 197 lbs 6 oz 193 lbs 2 oz 202 lbs  BMI 42.59 - -  Systolic 563 - 875  Diastolic 58 - 56  Pulse 64 - 66     Physical Exam- Limited    CMP     Component Value Date/Time   NA 129 (L) 08/03/2020 1337   NA 136  04/04/2020 0934   K 4.7 08/03/2020 1337   CL 94 (L) 08/03/2020 1337   CO2 27 08/03/2020 1337   GLUCOSE 395 (H) 08/03/2020 1337   BUN 22 08/03/2020 1337   BUN 29 (H) 04/04/2020 0934   CREATININE 1.34 (H) 08/03/2020 1337   CREATININE 1.32 (H) 03/07/2016 1043   CALCIUM 9.1 08/03/2020 1337   PROT 6.0 (L) 05/03/2020 0305   PROT 8.2 12/02/2019 1215   ALBUMIN 2.3 (L) 05/03/2020 0305   ALBUMIN 4.1 12/02/2019 1215   AST 28 05/03/2020 0305   ALT 15 05/03/2020 0305   ALKPHOS 118 05/03/2020 0305   BILITOT 4.1 (H) 05/03/2020 0305   BILITOT 3.2 (H) 12/02/2019 1215   GFRNONAA 56 (L) 08/03/2020 1337   GFRAA 52 (L) 04/04/2020 0934     Diabetic Labs (most recent): Lab Results  Component Value Date   HGBA1C 9.9 (A) 08/25/2020   HGBA1C 5.7 (H) 05/03/2020   HGBA1C 6.8 (H) 03/09/2020    Lipid Panel     Component Value Date/Time   CHOL 178 08/12/2019 0809   CHOL 142 01/17/2018 0835   TRIG 100 08/12/2019 0809   HDL 51 08/12/2019 0809   HDL 53 01/17/2018 0835   CHOLHDL 3.5 08/12/2019 0809   VLDL 20 08/12/2019 0809   LDLCALC 107 (H) 08/12/2019 0809   LDLCALC 71 01/17/2018 0835     Lab Results  Component Value Date   TSH 1.940 07/10/2017   TSH 1.563 08/25/2015   TSH 1.206 01/05/2011   FREET4 1.12 07/10/2017        Assessment & Plan:   1. DM type 2 causing vascular disease ([place])  - John [name] has currently uncontrolled symptomatic type 2 DM since 75 years of age.  During his last virtual visit, A1c was 5.7% and he was reporting significant frequency of hypoglycemia.  This led to discontinuation of Tresiba.  However, more recently he is running hyperglycemia averaging 200 for the last 7 days, 182 for the  last 14 days.  His point-of-care A1c is 9.9% today.  No recent hypoglycemia.  -He does not report any major hypoglycemia. Recent labs reviewed.  -his diabetes is complicated by obesity/sedentary life , CKD, CHF , congestive heart failure/bradyarrhythmia and RAEQWON LUX remains at a high risk for more acute and chronic complications which include CAD, CVA, CKD, retinopathy, and neuropathy. These are all discussed in detail with the patient.  - I encouraged him to switch to  unprocessed or minimally processed complex starch and increased protein intake (animal or plant source), fruits, and vegetables.  - he acknowledges that there is a room for improvement in his food and drink choices. - Suggestion is made for him to avoid simple carbohydrates  from his diet including Cakes, Sweet Desserts, Ice Cream, Soda (diet and regular), Sweet Tea, Candies, Chips, Cookies, Store Bought Juices, Alcohol in Excess of  1-2 drinks a day, Artificial Sweeteners,  Coffee Creamer, and "Sugar-free" Products, Lemonade. This will help patient to have more stable blood glucose profile and potentially avoid unintended weight gain.   - he is advised to stick to a routine mealtimes to eat 3 meals  a day and avoid unnecessary snacks ( to snack only to correct hypoglycemia).    - I have approached him with the following individualized plan to manage diabetes and patient agrees:   -Based on his presentation with loss of control of glycemia with A1c of 9.9%, he is approached for early initiation of insulin treatment and he  agrees.    -He is advised to restart [place] 20 units nightly, associated with monitoring of blood glucose 4 times a day-daily before breakfast, lunch, supper, and at bedtime.   -He may continue to benefit from [place] from cardiology point of view, advised to continue.  He is also advised to continue Trulicity 1.5 mg subcutaneously weekly.     -Patient is encouraged to call clinic for blood glucose  levels less than 70 or above 200 mg /dl.   2) Lipids/HPL:   His recent lipid panel showed controlled LDL at 71.  He will continue to benefit from statin treatment, advised to continue Crestor 20 mg p.o. nightly.  His advised on side effects and precautions.    3) hypertension- -his blood pressure is controlled to target at 104/58. -He is advised to continue on his torsemide as needed, carvedilol 25 mg p.o. twice daily.   4) weight management: His BMI is 26.77- not a candidate for major weight loss.  He lost enough weight recently.  He is advised to continue follow-up with his cardiologist.  - I advised patient to maintain close follow up with Erven Colla, DO for primary care needs.  I spent 41 minutes in the care of the patient today including review of labs from [place], Lipids, Thyroid Function, Hematology (current and previous including abstractions from other facilities); face-to-face time discussing  his blood glucose readings/logs, discussing hypoglycemia and hyperglycemia episodes and symptoms, medications doses, his options of short and long term treatment based on the latest standards of care / guidelines;  discussion about incorporating lifestyle medicine;  and documenting the encounter.    Please refer to Patient Instructions for Blood Glucose Monitoring and Insulin/Medications Dosing Guide"  in media tab for additional information. Please  also refer to " Patient Self Inventory" in the Media  tab for reviewed elements of pertinent patient history.  John [name] participated in the discussions, expressed understanding, and voiced agreement with the above plans.  All questions were answered to his satisfaction. he is encouraged to contact clinic should he have any questions or concerns prior to his return visit.    Follow up plan: - Return in about 3 weeks (around 09/15/2020) for F/U with Meter and Logs Only - no Labs, NV with Nashonda Limberg.  Glade Lloyd, MD [facility]  Group [facility] [address] [place], [place] [address] Phone: [phone]  Fax: [phone]    08/25/2020, 3:25 PM  This note was partially dictated with voice recognition software. Similar sounding words can be transcribed inadequately or may not  be corrected upon review.

## 2020-08-25 NOTE — Patient Instructions (Signed)

## 2020-08-26 ENCOUNTER — Other Ambulatory Visit: Payer: Self-pay

## 2020-08-26 ENCOUNTER — Encounter (HOSPITAL_COMMUNITY)
Admission: RE | Admit: 2020-08-26 | Discharge: 2020-08-26 | Disposition: A | Discharge status: Home / Self Care | Payer: Medicare HMO | Source: Ambulatory Visit | Attending: Internal Medicine | Admitting: Internal Medicine

## 2020-08-26 DIAGNOSIS — Z952 Presence of prosthetic heart valve: Secondary | ICD-10-CM

## 2020-08-26 NOTE — Progress Notes (Signed)
Daily Session Note  Patient Details  Name: John [name] MRN: [id number] Date of Birth: August 25, 1945 Referring Provider:   Flowsheet Row CARDIAC REHAB PHASE II EXERCISE from 07/04/2020 in [place]  Referring Provider taylor      Encounter Date: 08/26/2020  Check In:  Session Check In - 08/26/20 1445      Check-In   Supervising physician immediately available to respond to emergencies [facility] MD immediately available    Physician(s) Dr. Harl Bowie    Location [facility]    Staff Present Geanie Cooley, RN;Dalton Kris Mouton, MS, ACSM-CEP, Exercise Physiologist    Virtual Visit No    Medication changes reported     No    Fall or balance concerns reported    No    Tobacco Cessation No Change    Warm-up and Cool-down Performed as group-led instruction    Resistance Training Performed Yes    VAD Patient? No    PAD/SET Patient? No      Pain Assessment   Currently in Pain? No/denies    Multiple Pain Sites No           Capillary Blood Glucose: No results found for this or any previous visit (from the past 24 hour(s)).    Social History   Tobacco Use  Smoking Status Former Smoker  . Packs/day: 1.00  . Years: 10.00  . Pack years: 10.00  . Types: Cigarettes  . Quit date: 03/26/1986  . Years since quitting: 34.4  Smokeless Tobacco Never Used  Tobacco Comment   "stopped cigarette  smoking 1988"    Goals Met:  Independence with exercise equipment Exercise tolerated well No report of cardiac concerns or symptoms Strength training completed today  Goals Unmet:  Not Applicable  Comments: check out @ 3:45pm   Dr. Kathie Dike is Medical Director for [facility].

## 2020-08-29 ENCOUNTER — Encounter (HOSPITAL_COMMUNITY): Payer: Medicare HMO

## 2020-08-30 ENCOUNTER — Encounter (HOSPITAL_COMMUNITY): Payer: Medicare HMO

## 2020-08-31 ENCOUNTER — Encounter (HOSPITAL_COMMUNITY): Payer: Medicare HMO

## 2020-08-31 ENCOUNTER — Other Ambulatory Visit (HOSPITAL_COMMUNITY): Payer: Self-pay | Admitting: Family Medicine

## 2020-09-02 ENCOUNTER — Encounter (HOSPITAL_COMMUNITY): Payer: Medicare HMO

## 2020-09-05 ENCOUNTER — Encounter (HOSPITAL_COMMUNITY): Payer: Medicare HMO

## 2020-09-06 ENCOUNTER — Ambulatory Visit (HOSPITAL_COMMUNITY)
Admission: RE | Admit: 2020-09-06 | Discharge: 2020-09-06 | Disposition: A | Discharge status: Home / Self Care | Payer: Medicare HMO | Source: Ambulatory Visit | Attending: Family Medicine | Admitting: Family Medicine

## 2020-09-06 ENCOUNTER — Other Ambulatory Visit: Payer: Self-pay

## 2020-09-06 ENCOUNTER — Encounter (HOSPITAL_COMMUNITY): Payer: Self-pay

## 2020-09-06 VITALS — BP 104/60 | HR 62 | Wt 199.2 lb

## 2020-09-06 DIAGNOSIS — I4892 Unspecified atrial flutter: Secondary | ICD-10-CM | POA: Diagnosis not present

## 2020-09-06 DIAGNOSIS — I482 Chronic atrial fibrillation, unspecified: Secondary | ICD-10-CM | POA: Insufficient documentation

## 2020-09-06 DIAGNOSIS — N1832 Chronic kidney disease, stage 3b: Secondary | ICD-10-CM

## 2020-09-06 DIAGNOSIS — I340 Nonrheumatic mitral (valve) insufficiency: Secondary | ICD-10-CM

## 2020-09-06 DIAGNOSIS — I5032 Chronic diastolic (congestive) heart failure: Secondary | ICD-10-CM | POA: Diagnosis not present

## 2020-09-06 DIAGNOSIS — I080 Rheumatic disorders of both mitral and aortic valves: Secondary | ICD-10-CM | POA: Insufficient documentation

## 2020-09-06 DIAGNOSIS — Z7982 Long term (current) use of aspirin: Secondary | ICD-10-CM | POA: Insufficient documentation

## 2020-09-06 DIAGNOSIS — Z8249 Family history of ischemic heart disease and other diseases of the circulatory system: Secondary | ICD-10-CM | POA: Insufficient documentation

## 2020-09-06 DIAGNOSIS — Z950 Presence of cardiac pacemaker: Secondary | ICD-10-CM | POA: Diagnosis not present

## 2020-09-06 DIAGNOSIS — I130 Hypertensive heart and chronic kidney disease with heart failure and stage 1 through stage 4 chronic kidney disease, or unspecified chronic kidney disease: Secondary | ICD-10-CM | POA: Insufficient documentation

## 2020-09-06 DIAGNOSIS — Z87891 Personal history of nicotine dependence: Secondary | ICD-10-CM | POA: Insufficient documentation

## 2020-09-06 DIAGNOSIS — E119 Type 2 diabetes mellitus without complications: Secondary | ICD-10-CM | POA: Diagnosis not present

## 2020-09-06 DIAGNOSIS — I428 Other cardiomyopathies: Secondary | ICD-10-CM | POA: Diagnosis not present

## 2020-09-06 DIAGNOSIS — I4819 Other persistent atrial fibrillation: Secondary | ICD-10-CM

## 2020-09-06 DIAGNOSIS — Z952 Presence of prosthetic heart valve: Secondary | ICD-10-CM | POA: Insufficient documentation

## 2020-09-06 DIAGNOSIS — Z7901 Long term (current) use of anticoagulants: Secondary | ICD-10-CM | POA: Insufficient documentation

## 2020-09-06 DIAGNOSIS — E1122 Type 2 diabetes mellitus with diabetic chronic kidney disease: Secondary | ICD-10-CM | POA: Insufficient documentation

## 2020-09-06 DIAGNOSIS — Z8679 Personal history of other diseases of the circulatory system: Secondary | ICD-10-CM | POA: Diagnosis not present

## 2020-09-06 DIAGNOSIS — B356 Tinea cruris: Secondary | ICD-10-CM | POA: Diagnosis not present

## 2020-09-06 DIAGNOSIS — Z794 Long term (current) use of insulin: Secondary | ICD-10-CM

## 2020-09-06 DIAGNOSIS — I5042 Chronic combined systolic (congestive) and diastolic (congestive) heart failure: Secondary | ICD-10-CM | POA: Diagnosis not present

## 2020-09-06 DIAGNOSIS — I5022 Chronic systolic (congestive) heart failure: Secondary | ICD-10-CM | POA: Diagnosis not present

## 2020-09-06 DIAGNOSIS — Z79899 Other long term (current) drug therapy: Secondary | ICD-10-CM | POA: Insufficient documentation

## 2020-09-06 LAB — BASIC METABOLIC PANEL
Anion gap: 10 (ref 5–15)
BUN: 29 mg/dL — ABNORMAL HIGH (ref 8–23)
CO2: 27 mmol/L (ref 22–32)
Calcium: 10 mg/dL (ref 8.9–10.3)
Chloride: 98 mmol/L (ref 98–111)
Creatinine, Ser: 1.48 mg/dL — ABNORMAL HIGH (ref 0.61–1.24)
GFR, Estimated: 49 mL/min — ABNORMAL LOW (ref >60)
Glucose, Bld: 273 mg/dL — ABNORMAL HIGH (ref 70–99)
Potassium: 4.8 mmol/L (ref 3.5–5.1)
Sodium: 135 mmol/L (ref 135–145)

## 2020-09-06 LAB — CBC
HCT: 45.7 % (ref 39.0–52.0)
Hemoglobin: 14.9 g/dL (ref 13.0–17.0)
MCH: 29.7 pg (ref 26.0–34.0)
MCHC: 32.6 g/dL (ref 30.0–36.0)
MCV: 91 fL (ref 80.0–100.0)
Platelets: 114 10*3/uL — ABNORMAL LOW (ref 150–400)
RBC: 5.02 MIL/uL (ref 4.22–5.81)
RDW: 19.5 % — ABNORMAL HIGH (ref 11.5–15.5)
WBC: 5.7 10*3/uL (ref 4.0–10.5)
nRBC: 0 % (ref 0.0–0.2)

## 2020-09-06 MED ORDER — CLOTRIMAZOLE-BETAMETHASONE 1-0.05 % EX CREA: 1.0000 "application " | TOPICAL_CREAM | Freq: Two times a day (BID) | CUTANEOUS | 0 refills | Status: AC

## 2020-09-06 MED ORDER — LORATADINE 10 MG PO TABS: 10.0000 mg | ORAL_TABLET | Freq: Every day | ORAL | 2 refills | Status: DC

## 2020-09-06 NOTE — Progress Notes (Signed)
ADVANCED HF CLINIC NOTE  Referring Physician: Levell July NP, Cardiology  Primary Care: Erven Colla, DO Primary Cardiologist: Dr. Harl Bowie  HF Cardiologist: Dr. Haroldine Laws  Reason for Visit: Chronic Combined Systolic and Diastolic Heart Failure   HPI:  75 y/o male w/systolic HF due to NICM (cath 9/21 no CAD), h/o CHB s/p PPM 2017,  HTN, DM2, atrial flutter, stage 3b CKD (baseline SCr ~1.5-2.0) and severe AS s/p TAVR 2/22.Marland Kitchen   Echo 7/21 EF 30-35%. RV mildly reduced. Mod-sev MR Moderate AS  TEE 8/21  moderate MR and moderate TR. AoV severely thickened/heavily calcified  [facility] 9/21 No CAD. Moderately elevated filling pressures and moderately reduced CO. CI 1.8. Also concern for RV pacing CM with 42% RV pacing  Several admits in 12/21 for ADHF.   Admitted 2/22 for severe HF and TAVR. Started on dobutamine support. Underwent TVRF 05/03/20. Diuresed and DBA weaned off.   Attempted CRT upgrade 3/22. Left subclavian vein occluded.  Echo 3/22: EF 25-30% moderate MR. TAVR stable  Today he returns for HF follow up with his wife. Seen in clinic (5/22) with volume overload, weight now down 20 lbs. Overall feeling better after fluid off. Has been wearing depends for past month with increase in torsemide dose, and has itchy rash on left groin. Denies increasing SOB, CP, dizziness, edema, or PND/Orthopnea. Appetite ok. No fever or chills. Weight at home 195 pounds. Taking all medications.   Cardiac Studies: - Echo (6/17): EF 50-55% (PPM placed in 08/2015)  - Echo (8/17): EF dropped to 40-45% - Echo (2018): EF 45% - Echo (2021): EF 30-35%, RV mildly reduced  - Echo (3/22): EF 25-30% TAVR ok. Mod MR - LHC (9/21): that showed no coronary disease.  - based on timing of drop in EF and 42% pacing percentage, concern for RV paced CM +AS - Left subclavian vein occluded -> unable to upgrade to CRT - s/p TAVR (2/22)  Past Medical History:  Diagnosis Date   Atrial flutter ([place]) 12/2010   Admitted  with symptomatic bradycardia (HR 40s), atrial flutter with slow ventricular response 12/2010 + volume overload; AV nodal agents d/c'd and Pradaxa started; RFA in 01/2011   CHF (congestive heart failure) (HCC)    Chronic combined systolic and diastolic heart failure ([place])    a. echo 01/06/11: mild LVH, EF 65-70%, mod to severe LAE, mild RVE, mild RAE, PASP 32;   TEE 10/12: EF 45-50% b. EF 45 to 50% by echo in 07/2018   Class 2 severe obesity due to excess calories with serious comorbidity and body mass index (BMI) of 37.0 to 37.9 in adult ([place]) 07/17/2017   Diabetes mellitus    non insulin dependant   Hematoma, chest wall 05/15/2020   Hyperlipidemia    Hypertension    Osteoarthritis    Polyarticular gout 05/15/2020   Presence of permanent cardiac pacemaker    PSVT (paroxysmal supraventricular tachycardia) (HCC)    Possibly atrial flutter   S/P TAVR (transcatheter aortic valve replacement) 05/03/2020   s/p TAVR with a 29 mm Edwards S3U via the subclavian approach with Dr. Angelena Form & Dr. Cyndia Bent    Severe aortic stenosis     Current Outpatient Medications  Medication Sig Dispense Refill   acetaminophen (TYLENOL) 325 MG tablet Take 2 tablets (650 mg total) by mouth every 4 (four) hours as needed for headache or mild pain.     allopurinol (ZYLOPRIM) 300 MG tablet Take 0.5 tablets (150 mg total) by mouth daily. 15 tablet 6  apixaban (ELIQUIS) 5 MG TABS tablet Take 1 tablet (5 mg total) by mouth 2 (two) times daily. 180 tablet 3   aspirin 81 MG chewable tablet Chew 1 tablet (81 mg total) by mouth daily.     benzonatate (TESSALON) 100 MG capsule Take 1 capsule (100 mg total) by mouth 3 (three) times daily as needed for cough. 90 capsule 3   carvedilol (COREG) 3.125 MG tablet TAKE 1 TABLET IN THE MORNING AND 2 TABLETS IN THE EVENING 270 tablet 6   colchicine 0.6 MG tablet Take 1 tablet (0.6 mg total) by mouth daily as needed. 30 tablet 6   diphenoxylate-atropine (LOMOTIL) 2.5-0.025 MG tablet Take 1  tablet by mouth 4 (four) times daily as needed for diarrhea or loose stools.     FARXIGA 10 MG TABS tablet TAKE 1 TABLET BY MOUTH DAILY BEFORE BREAKFAST. 30 tablet 11   insulin degludec (TRESIBA FLEXTOUCH) 100 UNIT/ML FlexTouch Pen Inject 20 Units into the skin daily. 15 mL 1   Insulin Pen Needle (BD PEN NEEDLE NANO U/F) 32G X 4 MM MISC 1 each by Does not apply route 4 (four) times daily. 150 each 5   KLOR-CON M20 20 MEQ tablet TAKE 1.5 TABLETS (30 MEQ TOTAL) BY MOUTH 2 (TWO) TIMES DAILY. 270 tablet 3   Multiple Vitamins-Minerals (ONE-A-DAY MENS 50+) TABS Take 1 tablet by mouth daily with breakfast.     spironolactone (ALDACTONE) 25 MG tablet Take 0.5 tablets (12.5 mg total) by mouth daily. 15 tablet 6   torsemide (DEMADEX) 20 MG tablet Take 3 tablets (60 mg total) by mouth 2 (two) times daily. 102 tablet 3   TRULICITY 1.5 HE/5.2DP SOPN INJECT 1.5MG (1 PEN) SUBCUTANEOUSLY EVERY WEEK 2 mL 2   ACCU-CHEK AVIVA PLUS test strip TEST BLOOD SUGAR TWICE DAILY BEFORE BREAKFAST AND AT BEDTIME 200 strip 1   metolazone (ZAROXOLYN) 2.5 MG tablet Take 1 tablet (2.5 mg total) by mouth as directed. For wight of 200 lb and greater (Patient not taking: Reported on 09/06/2020) 5 tablet 3   No current facility-administered medications for this encounter.    No Known Allergies    Social History   Socioeconomic History   Marital status: Married    Spouse name: Not on file   Number of children: 2   Years of education: Not on file   Highest education level: Not on file  Occupational History   Occupation: Designer, industrial/product    Employer: RETIRED  Tobacco Use   Smoking status: Former    Packs/day: 1.00    Years: 10.00    Pack years: 10.00    Types: Cigarettes    Quit date: 03/26/1986    Years since quitting: 34.4   Smokeless tobacco: Never   Tobacco comments:    "stopped cigarette  smoking 1988"  Vaping Use   Vaping Use: Never used  Substance and Sexual Activity   Alcohol use: Not Currently     Alcohol/week: 0.0 standard drinks    Comment: "quit alcohol ~ 2007"   Drug use: No   Sexual activity: Yes    Partners: Female  Other Topics Concern   Not on file  Social History Narrative   Not on file   Social Determinants of Health   Financial Resource Strain: Not on file  Food Insecurity: Not on file  Transportation Needs: Not on file  Physical Activity: Not on file  Stress: Not on file  Social Connections: Not on file  Intimate Partner Violence: Not on file  Family History  Problem Relation Age of Onset   Heart attack Mother    Hypertension Mother    Heart attack Father    Hypertension Father    Heart attack Brother    Colon cancer Neg Hx     Vitals:   09/06/20 0912  BP: 104/60  Pulse: 62  SpO2: 97%  Weight: 90.4 kg   Wt Readings from Last 3 Encounters:  09/06/20 90.4 kg  08/25/20 89.5 kg  08/08/20 87.6 kg   PHYSICAL EXAM: General:  NAD. No resp difficulty, frail. HEENT: Normal Neck: Supple. No JVD. Carotids 2+ bilat; no bruits. No lymphadenopathy or thryomegaly appreciated. Cor: PMI nondisplaced. Irregular rate & rhythm. No rubs, gallops, II/VI SEM RUSB Lungs: Clear, diminished in bases Abdomen: Soft, nontender, nondistended. No hepatosplenomegaly. No bruits or masses. Good bowel sounds. Extremities: No cyanosis, clubbing,  edema. Patchy erythematous rash to left inner groin. Neuro: alert & oriented x 3, cranial nerves grossly intact. Moves all 4 extremities w/o difficulty. Affect pleasant.  ASSESSMENT & PLAN:  1. Chronic Combined Systolic and Diastolic Heart Failure/NICM: - Echo (6/17): EF 50-55% (PPM placed in 08/2015)  - Echo (8/17): EF dropped to 40-45% - Echo (2018): EF 45% - Echo (2021): EF 30-35%, RV mildly reduced  - Echo (3/22): EF 25-30% TAVR ok. Mod MR - LHC (9/21): that showed no coronary disease.  - based on timing of drop in EF and 42% pacing percentage, concern for RV paced CM +AS - Left subclavian vein occluded -> unable to upgrade  to CRT - s/p TAVR (2/22) - NYHA II (functional status difficult to assess as he is physically inactive), volume much better today, weight down 20 lbs from 2 weeks ago. - Continue torsemide 60 mg bid. (Take metolazone 2.5 mg with kcl 40 for weight of 200 or greater).  - Stop Wilder Glade due to yeast. - Continue spiro 12.5 mg daily. - Continue carvedilol 3.125/6.25 mg bid. - BMET today. - Counseled on careful attention to fluid and salt intake as we are stopping [place].  2. Mitral Regurgitation  - Moderate by TEE 8/21 and Echo 3/22. - Likely functional from dilated CM.  - Will follow.  3. Aortic Stenosis  - s/p TAVR 3/22. - stable on echo.   4. Atrial Flutter/ Chronic Atrial Fibrillation - s/p AFL ablation.  - Rate controlled. - Continue Eliquis. No bleeding. - CBC today.   5. Stage IIIb CKD - followed by nephrology.  - BMET today.  6. T2DM - On insulin. - Stop Wilder Glade due to tinea infection. Will notify Dr. Dorris Fetch.  7. H/o CHB  - s/p PPM followed by Dr. Lovena Le  - ? RV paced CM. He is pacing 42%. Had drop in EF 2 months after pacer was placed  - Following with Dr. Lovena Le. Attempted CRT upgrade 3/22 but left subclavian vein occluded  8. Tinea Cruris - Stop Farxiga. - Lotrisone cream.  Follow up RN visit for Reds clip and weight check in 3-4 weeks, then follow up in 3 months.  Kennett, FNP  09/06/20 9:26 AM

## 2020-09-06 NOTE — Patient Instructions (Signed)
STOP Farxiga  Start Claritin 10 mg, one tab daily Start Lotrisone, use on affected area twice a day for one week  Labs today We will only contact you if something comes back abnormal or we need to make some changes. Otherwise no news is good news!  Your physician recommends that you schedule a follow-up appointment in: 3 weeks for a nurse visit and in 3-4 months with Dr Haroldine Laws  Do the following things EVERYDAY: Weigh yourself in the morning before breakfast. Write it down and keep it in a log. Take your medicines as prescribed Eat low salt foods--Limit salt (sodium) to 2000 mg per day.  Stay as active as you can everyday Limit all fluids for the day to less than 2 liters  At the [facility], you and your health needs are our priority. As part of our continuing mission to provide you with exceptional heart care, we have created designated Provider Care Teams. These Care Teams include your primary Cardiologist (physician) and Advanced Practice Providers (APPs- Physician Assistants and Nurse Practitioners) who all work together to provide you with the care you need, when you need it.   You may see any of the following providers on your designated Care Team at your next follow up: Dr Glori Bickers Dr Loralie Champagne Dr Patrice Paradise, NP Lyda Jester, [place] Ginnie Smart Audry Riles, PharmD   Please be sure to bring in all your medications bottles to every appointment.

## 2020-09-07 ENCOUNTER — Encounter (HOSPITAL_COMMUNITY): Payer: Medicare HMO

## 2020-09-08 ENCOUNTER — Other Ambulatory Visit: Payer: Self-pay

## 2020-09-08 ENCOUNTER — Encounter (INDEPENDENT_AMBULATORY_CARE_PROVIDER_SITE_OTHER): Payer: Self-pay | Admitting: Gastroenterology

## 2020-09-08 ENCOUNTER — Ambulatory Visit (INDEPENDENT_AMBULATORY_CARE_PROVIDER_SITE_OTHER): Payer: Medicare HMO | Admitting: Gastroenterology

## 2020-09-08 VITALS — BP 112/58 | HR 73 | Temp 97.8°F | Ht 72.0 in | Wt 197.0 lb

## 2020-09-08 DIAGNOSIS — M25511 Pain in right shoulder: Secondary | ICD-10-CM

## 2020-09-08 DIAGNOSIS — R7401 Elevation of levels of liver transaminase levels: Secondary | ICD-10-CM | POA: Diagnosis not present

## 2020-09-08 DIAGNOSIS — K7581 Nonalcoholic steatohepatitis (NASH): Secondary | ICD-10-CM

## 2020-09-08 NOTE — Progress Notes (Signed)
[name], M.D. Gastroenterology & Hepatology [facility] [address] [place], [place] [address]  Primary Care Physician: Erven Colla, DO [place] [id number]  I will communicate my assessment and recommendations to the referring MD via EMR.  Problems: NASH, possible cirrhosis Constipation  History of Present Illness: John [name] is a 75 y.o. male with PMH afib on Eliquis, CHF, DM on insulin, HTN, HLD  who presents for follow up of NASH and elevated liver enzymes.  The patient was last seen on 03/03/2020. At that time, the patient was asked to start MiraLAX for constipation.  He also had blood testing ordered to evaluate other causes of elevated liver enzymes, but he also has a liver elastography ordered as his NAFLD score was 2.9.  Unfortunately, the patient reports that he was sick after this visit as he had an exacerbation of his heart failure and did not perform this testing.  He states that after he was discharged from the hospital, he started going to cardiac rehab compliantly and has implemented multiple lifestyle modifications.  He has lost 30 lb since the last time he was seen in clinic.  He denies having any complaints at the moment and states feeling fine. The patient denies having any nausea, vomiting, fever, chills, hematochezia, melena, hematemesis, abdominal distention, abdominal pain, diarrhea, jaundice, pruritus or weight loss.  States that he has not been working out recently as he believes he dislocated his right shoulder.  His last hemoglobin A1c was 5.7. Most recent labs evaluate his liver were from 05/03/2020 which showed an AST of 28, ALT of 15, admitting 2.3, total bilirubin 4.1, alkaline phosphatase 118, CBC with white cell count of 8.3, hemoglobin 9.9, platelets 142, INR 1.4.  Last Colonoscopy: 2019 - 3 polyps in [facility], diverticulosis. Advised to repeat in 5 years.  Past Medical  History: Past Medical History:  Diagnosis Date   Atrial flutter ([place]) 12/2010   Admitted with symptomatic bradycardia (HR 40s), atrial flutter with slow ventricular response 12/2010 + volume overload; AV nodal agents d/c'd and Pradaxa started; RFA in 01/2011   CHF (congestive heart failure) (HCC)    Chronic combined systolic and diastolic heart failure ([place])    a. echo 01/06/11: mild LVH, EF 65-70%, mod to severe LAE, mild RVE, mild RAE, PASP 32;   TEE 10/12: EF 45-50% b. EF 45 to 50% by echo in 07/2018   Class 2 severe obesity due to excess calories with serious comorbidity and body mass index (BMI) of 37.0 to 37.9 in adult ([place]) 07/17/2017   Diabetes mellitus    non insulin dependant   Hematoma, chest wall 05/15/2020   Hyperlipidemia    Hypertension    Osteoarthritis    Polyarticular gout 05/15/2020   Presence of permanent cardiac pacemaker    PSVT (paroxysmal supraventricular tachycardia) (HCC)    Possibly atrial flutter   S/P TAVR (transcatheter aortic valve replacement) 05/03/2020   s/p TAVR with a 29 mm Edwards S3U via the subclavian approach with Dr. Angelena Form & Dr. Cyndia Bent    Severe aortic stenosis     Past Surgical History: Past Surgical History:  Procedure Laterality Date   ATRIAL FLUTTER ABLATION N/A 02/07/2011   Procedure: ATRIAL FLUTTER ABLATION;  Surgeon: Evans Lance, MD;  Location: [facility] CATH LAB;  Service: Cardiovascular;  Laterality: N/A;   BIV UPGRADE N/A 06/08/2020   Procedure: BIV PPM UPGRADE;  Surgeon: Evans Lance, MD;  Location: [facility] INVASIVE CV  LAB;  Service: Cardiovascular;  Laterality: N/A;   CARDIAC ELECTROPHYSIOLOGY STUDY AND ABLATION  02/07/11   CARDIOVERSION N/A 03/23/2016   Procedure: CARDIOVERSION;  Surgeon: Evans Lance, MD;  Location: [place];  Service: Cardiovascular;  Laterality: N/A;   COLONOSCOPY N/A 04/17/2017   Procedure: COLONOSCOPY;  Surgeon: Rogene Houston, MD;  Location: [facility] ENDO SUITE;  Service: Endoscopy;  Laterality: N/A;  [place] N/A 08/29/2015   Procedure: Pacemaker Implant;  Surgeon: Evans Lance, MD;  Location: [place] CV LAB;  Service: Cardiovascular;  Laterality: N/A;   EP IMPLANTABLE DEVICE N/A 12/07/2015   Procedure: PPM Lead Revision/Repair;  Surgeon: Evans Lance, MD;  Location: [place] CV LAB;  Service: Cardiovascular;  Laterality: N/A;   KNEE ARTHROSCOPY  2005   left   LUMBAR SPINE SURGERY     "I've had 6 ORs 1972 thru 2004"   POLYPECTOMY  04/17/2017   Procedure: POLYPECTOMY;  Surgeon: Rogene Houston, MD;  Location: [facility] ENDO SUITE;  Service: Endoscopy;;  transverse colon x3;   RIGHT HEART CATH N/A 04/28/2020   Procedure: RIGHT HEART CATH;  Surgeon: Jolaine Artist, MD;  Location: [place] CV LAB;  Service: Cardiovascular;  Laterality: N/A;   RIGHT/LEFT HEART CATH AND CORONARY ANGIOGRAPHY N/A 12/21/2019   Procedure: RIGHT/LEFT HEART CATH AND CORONARY ANGIOGRAPHY;  Surgeon: Nelva Bush, MD;  Location: [place] CV LAB;  Service: Cardiovascular;  Laterality: N/A;   TEE WITHOUT CARDIOVERSION N/A 11/05/2019   Procedure: TRANSESOPHAGEAL ECHOCARDIOGRAM (TEE) WITH PROPOFOL;  Surgeon: Arnoldo Lenis, MD;  [facility];  Service: Endoscopy;  Laterality: N/A;   TEE WITHOUT CARDIOVERSION N/A 05/03/2020   Procedure: TRANSESOPHAGEAL ECHOCARDIOGRAM (TEE);  Surgeon: Burnell Blanks, MD;  Location: [place];  Service: Open Heart Surgery;  Laterality: N/A;    Family History: Family History  Problem Relation Age of Onset   Heart attack Mother    Hypertension Mother    Heart attack Father    Hypertension Father    Heart attack Brother    Colon cancer Neg Hx     Social History: Social History   Tobacco Use  Smoking Status Former   Packs/day: 1.00   Years: 10.00   Pack years: 10.00   Types: Cigarettes   Quit date: 03/26/1986   Years since quitting: 34.4  Smokeless Tobacco Never  Tobacco Comments   "stopped cigarette  smoking 1988"   Social History    Substance and Sexual Activity  Alcohol Use Not Currently   Alcohol/week: 0.0 standard drinks   Comment: "quit alcohol ~ 2007"   Social History   Substance and Sexual Activity  Drug Use No    Allergies: No Known Allergies  Medications: Current Outpatient Medications  Medication Sig Dispense Refill   ACCU-CHEK AVIVA PLUS test strip TEST BLOOD SUGAR TWICE DAILY BEFORE BREAKFAST AND AT BEDTIME 200 strip 1   acetaminophen (TYLENOL) 325 MG tablet Take 2 tablets (650 mg total) by mouth every 4 (four) hours as needed for headache or mild pain.     allopurinol (ZYLOPRIM) 300 MG tablet Take 0.5 tablets (150 mg total) by mouth daily. (Patient taking differently: Take 150 mg by mouth daily. As needed.) 15 tablet 6   apixaban (ELIQUIS) 5 MG TABS tablet Take 1 tablet (5 mg total) by mouth 2 (two) times daily. 180 tablet 3   aspirin 81 MG chewable tablet Chew 1 tablet (81 mg total) by mouth daily.     benzonatate (  TESSALON) 100 MG capsule Take 1 capsule (100 mg total) by mouth 3 (three) times daily as needed for cough. 90 capsule 3   carvedilol (COREG) 3.125 MG tablet TAKE 1 TABLET IN THE MORNING AND 2 TABLETS IN THE EVENING 270 tablet 6   clotrimazole-betamethasone (LOTRISONE) cream Apply 1 application topically 2 (two) times daily for 7 days. 30 g 0   colchicine 0.6 MG tablet Take 1 tablet (0.6 mg total) by mouth daily as needed. 30 tablet 6   diphenoxylate-atropine (LOMOTIL) 2.5-0.025 MG tablet Take 1 tablet by mouth 4 (four) times daily as needed for diarrhea or loose stools.     insulin degludec (TRESIBA FLEXTOUCH) 100 UNIT/ML FlexTouch Pen Inject 20 Units into the skin daily. 15 mL 1   Insulin Pen Needle (BD PEN NEEDLE NANO U/F) 32G X 4 MM MISC 1 each by Does not apply route 4 (four) times daily. 150 each 5   KLOR-CON M20 20 MEQ tablet TAKE 1.5 TABLETS (30 MEQ TOTAL) BY MOUTH 2 (TWO) TIMES DAILY. 270 tablet 3   loratadine (CLARITIN) 10 MG tablet Take 1 tablet (10 mg total) by mouth daily. 30  tablet 2   metolazone (ZAROXOLYN) 2.5 MG tablet Take 1 tablet (2.5 mg total) by mouth as directed. For wight of 200 lb and greater 5 tablet 3   Multiple Vitamins-Minerals (ONE-A-DAY MENS 50+) TABS Take 1 tablet by mouth daily with breakfast.     spironolactone (ALDACTONE) 25 MG tablet Take 0.5 tablets (12.5 mg total) by mouth daily. 15 tablet 6   torsemide (DEMADEX) 20 MG tablet Take 3 tablets (60 mg total) by mouth 2 (two) times daily. 678 tablet 3   TRULICITY 1.5 LF/8.1OF SOPN INJECT 1.5MG (1 PEN) SUBCUTANEOUSLY EVERY WEEK 2 mL 2   No current facility-administered medications for this visit.    Review of Systems: GENERAL: negative for malaise, night sweats HEENT: No changes in hearing or vision, no nose bleeds or other nasal problems. NECK: Negative for lumps, goiter, pain and significant neck swelling RESPIRATORY: Negative for cough, wheezing CARDIOVASCULAR: Negative for chest pain, leg swelling, palpitations, orthopnea GI: SEE HPI MUSCULOSKELETAL: Negative for joint pain or swelling, back pain, and muscle pain. SKIN: Negative for lesions, rash PSYCH: Negative for sleep disturbance, mood disorder and recent psychosocial stressors. HEMATOLOGY Negative for prolonged bleeding, bruising easily, and swollen nodes. ENDOCRINE: Negative for cold or heat intolerance, polyuria, polydipsia and goiter. NEURO: negative for tremor, gait imbalance, syncope and seizures. The remainder of the review of systems is noncontributory.   Physical Exam: BP (!) 112/58 (BP Location: Right Arm, Patient Position: Sitting, Cuff Size: Large)   Pulse 73   Temp 97.8 F (36.6 C) (Oral)   Ht 6' (1.829 m)   Wt 197 lb (89.4 kg)   BMI 26.72 kg/m  GENERAL: The patient is AO x3, in no acute distress. HEENT: Head is normocephalic and atraumatic. EOMI are intact. Mouth is well hydrated and without lesions. NECK: Supple. No masses LUNGS: Clear to auscultation. No presence of rhonchi/wheezing/rales. Adequate chest  expansion HEART: RRR, normal s1 and s2. ABDOMEN: Soft, nontender, no guarding, no peritoneal signs, and nondistended. BS +. No masses. EXTREMITIES: Without any cyanosis, clubbing, rash, lesions or edema. Has pain uopn palpation of the R shoulder, cannot raise up arm above 30 degrees. NEUROLOGIC: AOx3, no focal motor deficit. SKIN: no jaundice, no rashes  Imaging/Labs: as above  I personally reviewed and interpreted the available labs, imaging and endoscopic files.  Impression and Plan: John [name]  is a 75 y.o. male with PMH afib on Eliquis, CHF, DM on insulin, HTN, HLD  who presents for follow up of NASH and elevated liver enzymes.  The patient had elevation of his liver enzymes in the past which have improved based on most recent blood work-up.  In fact he has lost significant amount of weight on purpose by implementing lifestyle modifications, I congratulated the patient due to this effort as this will help decrease his metabolic disturbances leading to active NASH.  We will still need to check repeat MELD labs, AFP, TTG IgA and IgG as part of the evaluation of his previously elevated liver enzymes.  We will reschedule his liver elastography as it would be important to determine if he has advanced liver fibrosis or compensated cirrhosis.  The patient and the wife understood and agreed.   Finally, he has complained of some shoulder pain which he considers a secondary to a dislocation of the shoulder, I referred him to orthopedic surgeon.  -Check MELD labs, AFP, TTG IgA and IgG - Continue exercising and with healthy diet - Schedule liver elastography - Referral to orthopedic surgeon - RTC 6 months  All questions were answered.      Harvel Quale, MD Gastroenterology and Hepatology [facility]

## 2020-09-08 NOTE — Patient Instructions (Addendum)
Continue exercising and with healthy diet - doing a great job with weight loss! Schedule liver elastography Referral to orthopedic surgeon

## 2020-09-09 ENCOUNTER — Encounter (HOSPITAL_COMMUNITY): Payer: Medicare HMO

## 2020-09-12 ENCOUNTER — Encounter (HOSPITAL_COMMUNITY): Payer: Medicare HMO

## 2020-09-12 DIAGNOSIS — R748 Abnormal levels of other serum enzymes: Secondary | ICD-10-CM | POA: Diagnosis not present

## 2020-09-12 DIAGNOSIS — K7581 Nonalcoholic steatohepatitis (NASH): Secondary | ICD-10-CM | POA: Diagnosis not present

## 2020-09-12 DIAGNOSIS — R7401 Elevation of levels of liver transaminase levels: Secondary | ICD-10-CM | POA: Diagnosis not present

## 2020-09-13 ENCOUNTER — Other Ambulatory Visit: Payer: Self-pay

## 2020-09-13 ENCOUNTER — Encounter: Payer: Self-pay | Admitting: Internal Medicine

## 2020-09-13 ENCOUNTER — Ambulatory Visit (INDEPENDENT_AMBULATORY_CARE_PROVIDER_SITE_OTHER): Payer: Medicare HMO | Admitting: Internal Medicine

## 2020-09-13 VITALS — BP 128/56 | HR 56 | Ht 72.0 in | Wt 205.2 lb

## 2020-09-13 DIAGNOSIS — I442 Atrioventricular block, complete: Secondary | ICD-10-CM

## 2020-09-13 NOTE — Progress Notes (Signed)
HPI Mr. Heal returns today for followup. He is a pleasant 75 yo man with CHB, s/p PPM insertion, systolic heart failure and AS, s/p TAVR. He was referred for biv upgrade but found to have an occluded left subclavian vein and an LV lead could not be placed. He notes class 2B CHF symptoms. He is able to walk but no perform vigorous physical activity. No syncope. He has chronic edema.  No Known Allergies   Current Outpatient Medications  Medication Sig Dispense Refill   ACCU-CHEK AVIVA PLUS test strip TEST BLOOD SUGAR TWICE DAILY BEFORE BREAKFAST AND AT BEDTIME 200 strip 1   acetaminophen (TYLENOL) 325 MG tablet Take 2 tablets (650 mg total) by mouth every 4 (four) hours as needed for headache or mild pain.     allopurinol (ZYLOPRIM) 300 MG tablet Take 0.5 tablets (150 mg total) by mouth daily. (Patient taking differently: Take 150 mg by mouth daily. As needed.) 15 tablet 6   apixaban (ELIQUIS) 5 MG TABS tablet Take 1 tablet (5 mg total) by mouth 2 (two) times daily. 180 tablet 3   aspirin 81 MG chewable tablet Chew 1 tablet (81 mg total) by mouth daily.     benzonatate (TESSALON) 100 MG capsule Take 1 capsule (100 mg total) by mouth 3 (three) times daily as needed for cough. 90 capsule 3   carvedilol (COREG) 3.125 MG tablet TAKE 1 TABLET IN THE MORNING AND 2 TABLETS IN THE EVENING 270 tablet 6   clotrimazole-betamethasone (LOTRISONE) cream Apply 1 application topically 2 (two) times daily for 7 days. 30 g 0   colchicine 0.6 MG tablet Take 1 tablet (0.6 mg total) by mouth daily as needed. 30 tablet 6   diphenoxylate-atropine (LOMOTIL) 2.5-0.025 MG tablet Take 1 tablet by mouth 4 (four) times daily as needed for diarrhea or loose stools.     insulin degludec (TRESIBA FLEXTOUCH) 100 UNIT/ML FlexTouch Pen Inject 20 Units into the skin daily. 15 mL 1   Insulin Pen Needle (BD PEN NEEDLE NANO U/F) 32G X 4 MM MISC 1 each by Does not apply route 4 (four) times daily. 150 each 5   KLOR-CON M20 20  MEQ tablet TAKE 1.5 TABLETS (30 MEQ TOTAL) BY MOUTH 2 (TWO) TIMES DAILY. 270 tablet 3   loratadine (CLARITIN) 10 MG tablet Take 1 tablet (10 mg total) by mouth daily. 30 tablet 2   metolazone (ZAROXOLYN) 2.5 MG tablet Take 1 tablet (2.5 mg total) by mouth as directed. For wight of 200 lb and greater 5 tablet 3   Multiple Vitamins-Minerals (ONE-A-DAY MENS 50+) TABS Take 1 tablet by mouth daily with breakfast.     spironolactone (ALDACTONE) 25 MG tablet Take 0.5 tablets (12.5 mg total) by mouth daily. 15 tablet 6   torsemide (DEMADEX) 20 MG tablet Take 3 tablets (60 mg total) by mouth 2 (two) times daily. 944 tablet 3   TRULICITY 1.5 HQ/7.5FF SOPN INJECT 1.5MG (1 PEN) SUBCUTANEOUSLY EVERY WEEK 2 mL 2   No current facility-administered medications for this visit.     Past Medical History:  Diagnosis Date   Atrial flutter ([place]) 12/2010   Admitted with symptomatic bradycardia (HR 40s), atrial flutter with slow ventricular response 12/2010 + volume overload; AV nodal agents d/c'd and Pradaxa started; RFA in 01/2011   CHF (congestive heart failure) (HCC)    Chronic combined systolic and diastolic heart failure ([place])    a. echo 01/06/11: mild LVH, EF 65-70%, mod to severe LAE, mild  RVE, mild RAE, PASP 32;   TEE 10/12: EF 45-50% b. EF 45 to 50% by echo in 07/2018   Class 2 severe obesity due to excess calories with serious comorbidity and body mass index (BMI) of 37.0 to 37.9 in adult ([place]) 07/17/2017   Diabetes mellitus    non insulin dependant   Hematoma, chest wall 05/15/2020   Hyperlipidemia    Hypertension    Osteoarthritis    Polyarticular gout 05/15/2020   Presence of permanent cardiac pacemaker    PSVT (paroxysmal supraventricular tachycardia) (HCC)    Possibly atrial flutter   S/P TAVR (transcatheter aortic valve replacement) 05/03/2020   s/p TAVR with a 29 mm Edwards S3U via the subclavian approach with Dr. Angelena Form & Dr. Cyndia Bent    Severe aortic stenosis     ROS:   All systems  reviewed and negative except as noted in the HPI.   Past Surgical History:  Procedure Laterality Date   ATRIAL FLUTTER ABLATION N/A 02/07/2011   Procedure: ATRIAL FLUTTER ABLATION;  Surgeon: Evans Lance, MD;  Location: [facility] CATH LAB;  Service: Cardiovascular;  Laterality: N/A;   BIV UPGRADE N/A 06/08/2020   Procedure: BIV PPM UPGRADE;  Surgeon: Evans Lance, MD;  Location: [place] CV LAB;  Service: Cardiovascular;  Laterality: N/A;   CARDIAC ELECTROPHYSIOLOGY STUDY AND ABLATION  02/07/11   CARDIOVERSION N/A 03/23/2016   Procedure: CARDIOVERSION;  Surgeon: Evans Lance, MD;  Location: [place];  Service: Cardiovascular;  Laterality: N/A;   COLONOSCOPY N/A 04/17/2017   Procedure: COLONOSCOPY;  Surgeon: Rogene Houston, MD;  [facility];  Service: Endoscopy;  Laterality: N/A;  [place] N/A 08/29/2015   Procedure: Pacemaker Implant;  Surgeon: Evans Lance, MD;  Location: [place] CV LAB;  Service: Cardiovascular;  Laterality: N/A;   EP IMPLANTABLE DEVICE N/A 12/07/2015   Procedure: PPM Lead Revision/Repair;  Surgeon: Evans Lance, MD;  Location: [place] CV LAB;  Service: Cardiovascular;  Laterality: N/A;   KNEE ARTHROSCOPY  2005   left   LUMBAR SPINE SURGERY     "I've had 6 ORs 1972 thru 2004"   POLYPECTOMY  04/17/2017   Procedure: POLYPECTOMY;  Surgeon: Rogene Houston, MD;  [facility];  Service: Endoscopy;;  transverse colon x3;   RIGHT HEART CATH N/A 04/28/2020   Procedure: RIGHT HEART CATH;  Surgeon: Jolaine Artist, MD;  Location: [place] CV LAB;  Service: Cardiovascular;  Laterality: N/A;   RIGHT/LEFT HEART CATH AND CORONARY ANGIOGRAPHY N/A 12/21/2019   Procedure: RIGHT/LEFT HEART CATH AND CORONARY ANGIOGRAPHY;  Surgeon: Nelva Bush, MD;  Location: [place] CV LAB;  Service: Cardiovascular;  Laterality: N/A;   TEE WITHOUT CARDIOVERSION N/A 11/05/2019   Procedure: TRANSESOPHAGEAL ECHOCARDIOGRAM (TEE) WITH  PROPOFOL;  Surgeon: Arnoldo Lenis, MD;  [facility];  Service: Endoscopy;  Laterality: N/A;   TEE WITHOUT CARDIOVERSION N/A 05/03/2020   Procedure: TRANSESOPHAGEAL ECHOCARDIOGRAM (TEE);  Surgeon: Burnell Blanks, MD;  Location: [place];  Service: Open Heart Surgery;  Laterality: N/A;     Family History  Problem Relation Age of Onset   Heart attack Mother    Hypertension Mother    Heart attack Father    Hypertension Father    Heart attack Brother    Colon cancer Neg Hx      Social History   Socioeconomic History   Marital status: Married    Spouse name: Not on file  Number of children: 2   Years of education: Not on file   Highest education level: Not on file  Occupational History   Occupation: Designer, industrial/product    Employer: RETIRED  Tobacco Use   Smoking status: Former    Packs/day: 1.00    Years: 10.00    Pack years: 10.00    Types: Cigarettes    Quit date: 03/26/1986    Years since quitting: 34.4   Smokeless tobacco: Never   Tobacco comments:    "stopped cigarette  smoking 1988"  Vaping Use   Vaping Use: Never used  Substance and Sexual Activity   Alcohol use: Not Currently    Alcohol/week: 0.0 standard drinks    Comment: "quit alcohol ~ 2007"   Drug use: No   Sexual activity: Yes    Partners: Female  Other Topics Concern   Not on file  Social History Narrative   Not on file   Social Determinants of Health   Financial Resource Strain: Not on file  Food Insecurity: Not on file  Transportation Needs: Not on file  Physical Activity: Not on file  Stress: Not on file  Social Connections: Not on file  Intimate Partner Violence: Not on file     BP (!) 128/56   Pulse (!) 56   Ht 6' (1.829 m)   Wt 205 lb 3.2 oz (93.1 kg)   SpO2 99%   BMI 27.83 kg/m   Physical Exam:  Well appearing NAD HEENT: Unremarkable;  Neck:  No JVD, no thyromegally Lymphatics:  No adenopathy Back:  No CVA tenderness Lungs:  Clear with no  wheezes HEART:  Regular rate rhythm, no murmurs, no rubs, no clicks;split S2. Abd:  soft, positive bowel sounds, no organomegally, no rebound, no guarding Ext:  2 plus pulses, no edema, no cyanosis, no clubbing Skin:  No rashes no nodules Neuro:  CN II through XII intact, motor grossly intact  DEVICE  Normal device function.  See PaceArt for details.   Assess/Plan:  CHB - he is stable after DDD PM insertion.  PPM - his medtronic device is working normally. We discussed biv upgrade in detail today. Chronic systolic heart failure - as his symptoms are class 2. I think it would be reasonable to hold off on lead extraction and biv upgrade for now.  Atrial fib - he has been out of rhythm for years. He did not feel any different when he went from NSR to atrial fib several years ago. I would not pursue NSR at this point. Carleene Overlie Geo Slone,MD

## 2020-09-13 NOTE — Patient Instructions (Signed)
Medication Instructions:  Your physician recommends that you continue on your current medications as directed. Please refer to the Current Medication list given to you today.  *If you need a refill on your cardiac medications before your next appointment, please call your pharmacy*   Lab Work: NONE   If you have labs (blood work) drawn today and your tests are completely normal, you will receive your results only by: [place] (if you have MyChart) OR A paper copy in the mail If you have any lab test that is abnormal or we need to change your treatment, we will call you to review the results.   Testing/Procedures: NONE    Follow-Up: At [facility], you and your health needs are our priority.  As part of our continuing mission to provide you with exceptional heart care, we have created designated Provider Care Teams.  These Care Teams include your primary Cardiologist (physician) and Advanced Practice Providers (APPs -  Physician Assistants and Nurse Practitioners) who all work together to provide you with the care you need, when you need it.  We recommend signing up for the patient portal called "MyChart".  Sign up information is provided on this After Visit Summary.  MyChart is used to connect with patients for Virtual Visits (Telemedicine).  Patients are able to view lab/test results, encounter notes, upcoming appointments, etc.  Non-urgent messages can be sent to your provider as well.   To learn more about what you can do with MyChart, go to [email or web address].    Your next appointment:   6 month(s)  The format for your next appointment:   In Person  Provider:   [name], MD   Other Instructions Thank you for choosing [facility]!

## 2020-09-14 ENCOUNTER — Ambulatory Visit (INDEPENDENT_AMBULATORY_CARE_PROVIDER_SITE_OTHER): Payer: Medicare HMO | Admitting: Orthopedic Surgery

## 2020-09-14 ENCOUNTER — Encounter (HOSPITAL_COMMUNITY): Payer: Medicare HMO

## 2020-09-14 ENCOUNTER — Encounter: Payer: Self-pay | Admitting: Orthopedic Surgery

## 2020-09-14 ENCOUNTER — Ambulatory Visit: Payer: Medicare HMO

## 2020-09-14 VITALS — BP 120/54 | HR 57 | Ht 72.0 in | Wt 201.8 lb

## 2020-09-14 DIAGNOSIS — M25511 Pain in right shoulder: Secondary | ICD-10-CM

## 2020-09-14 DIAGNOSIS — M12811 Other specific arthropathies, not elsewhere classified, right shoulder: Secondary | ICD-10-CM | POA: Diagnosis not present

## 2020-09-14 NOTE — Progress Notes (Signed)
New Patient Visit  Assessment: John [name] is a 75 y.o. male with the following: 1. Right shoulder rotator cuff arthropathy  Plan: Patient has pain and dysfunction in his right shoulder.  Radiographs with evidence of a chronic rotator cuff injury, but it has worsened in pain recently.  Discussed options including medications, prescription medications or an injection.  He would like to continue with tylenol  and voltaren gel, and consider an injection if his pain does not improve.  All questions were answered. Follow up as needed.    Follow-up: Return if symptoms worsen or fail to improve.  Subjective:  Chief Complaint  Patient presents with   New Patient (Initial Visit)   Shoulder Pain    Right shoulder/x 2-3 wks/no know injury    History of Present Illness: John [name] is a 75 y.o. male who has been referred by Philomena Doheny, MD for evaluation of right shoulder pain.  He has had severe pain in this shoulder for the past 3 weeks.  No known injury.  Prior to this, he denies a history of pain in his shoulder ever.  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  He has limited function with his right shoulder.  Pain is the biggest complaint at this time.  It gets worse at night.  Pain is anterior and lateral, with some radiating pains distally. He has been using tylenol for pain.  No previous injections.     Review of Systems: No fevers or chills No numbness or tingling No chest pain No shortness of breath No bowel or bladder dysfunction No GI distress No headaches   Medical History:  Past Medical History:  Diagnosis Date   Atrial flutter ([place]) 12/2010   Admitted with symptomatic bradycardia (HR 40s), atrial flutter with slow ventricular response 12/2010 + volume overload; AV nodal agents d/c'd and Pradaxa started; RFA in 01/2011   CHF (congestive heart failure) (HCC)    Chronic combined systolic and diastolic heart failure  ([place])    a. echo 01/06/11: mild LVH, EF 65-70%, mod to severe LAE, mild RVE, mild RAE, PASP 32;   TEE 10/12: EF 45-50% b. EF 45 to 50% by echo in 07/2018   Class 2 severe obesity due to excess calories with serious comorbidity and body mass index (BMI) of 37.0 to 37.9 in adult ([place]) 07/17/2017   Diabetes mellitus    non insulin dependant   Hematoma, chest wall 05/15/2020   Hyperlipidemia    Hypertension    Osteoarthritis    Polyarticular gout 05/15/2020   Presence of permanent cardiac pacemaker    PSVT (paroxysmal supraventricular tachycardia) (HCC)    Possibly atrial flutter   S/P TAVR (transcatheter aortic valve replacement) 05/03/2020   s/p TAVR with a 29 mm Edwards S3U via the subclavian approach with Dr. Angelena Form & Dr. Cyndia Bent    Severe aortic stenosis     Past Surgical History:  Procedure Laterality Date   ATRIAL FLUTTER ABLATION N/A 02/07/2011   Procedure: ATRIAL FLUTTER ABLATION;  Surgeon: Evans Lance, MD;  Location: [facility] CATH LAB;  Service: Cardiovascular;  Laterality: N/A;   BIV UPGRADE N/A 06/08/2020   Procedure: BIV PPM UPGRADE;  Surgeon: Evans Lance, MD;  Location: [place] CV LAB;  Service: Cardiovascular;  Laterality: N/A;   CARDIAC ELECTROPHYSIOLOGY STUDY AND ABLATION  02/07/11   CARDIOVERSION N/A 03/23/2016   Procedure: CARDIOVERSION;  Surgeon: Evans Lance, MD;  Location: [place];  Service: Cardiovascular;  Laterality: N/A;   COLONOSCOPY N/A 04/17/2017   Procedure: COLONOSCOPY;  Surgeon: Rogene Houston, MD;  [facility];  Service: Endoscopy;  Laterality: N/A;  [place] N/A 08/29/2015   Procedure: Pacemaker Implant;  Surgeon: Evans Lance, MD;  Location: [place] CV LAB;  Service: Cardiovascular;  Laterality: N/A;   EP IMPLANTABLE DEVICE N/A 12/07/2015   Procedure: PPM Lead Revision/Repair;  Surgeon: Evans Lance, MD;  Location: [place] CV LAB;  Service: Cardiovascular;  Laterality: N/A;   KNEE ARTHROSCOPY  2005    left   LUMBAR SPINE SURGERY     "I've had 6 ORs 1972 thru 2004"   POLYPECTOMY  04/17/2017   Procedure: POLYPECTOMY;  Surgeon: Rogene Houston, MD;  [facility];  Service: Endoscopy;;  transverse colon x3;   RIGHT HEART CATH N/A 04/28/2020   Procedure: RIGHT HEART CATH;  Surgeon: Jolaine Artist, MD;  Location: [place] CV LAB;  Service: Cardiovascular;  Laterality: N/A;   RIGHT/LEFT HEART CATH AND CORONARY ANGIOGRAPHY N/A 12/21/2019   Procedure: RIGHT/LEFT HEART CATH AND CORONARY ANGIOGRAPHY;  Surgeon: Nelva Bush, MD;  Location: [place] CV LAB;  Service: Cardiovascular;  Laterality: N/A;   TEE WITHOUT CARDIOVERSION N/A 11/05/2019   Procedure: TRANSESOPHAGEAL ECHOCARDIOGRAM (TEE) WITH PROPOFOL;  Surgeon: Arnoldo Lenis, MD;  [facility];  Service: Endoscopy;  Laterality: N/A;   TEE WITHOUT CARDIOVERSION N/A 05/03/2020   Procedure: TRANSESOPHAGEAL ECHOCARDIOGRAM (TEE);  Surgeon: Burnell Blanks, MD;  Location: [place];  Service: Open Heart Surgery;  Laterality: N/A;    Family History  Problem Relation Age of Onset   Heart attack Mother    Hypertension Mother    Heart attack Father    Hypertension Father    Heart attack Brother    Colon cancer Neg Hx    Social History   Tobacco Use   Smoking status: Former    Packs/day: 1.00    Years: 10.00    Pack years: 10.00    Types: Cigarettes    Quit date: 03/26/1986    Years since quitting: 34.4   Smokeless tobacco: Never   Tobacco comments:    "stopped cigarette  smoking 1988"  Vaping Use   Vaping Use: Never used  Substance Use Topics   Alcohol use: Not Currently    Alcohol/week: 0.0 standard drinks    Comment: "quit alcohol ~ 2007"   Drug use: No    No Known Allergies  Current Meds  Medication Sig   ACCU-CHEK AVIVA PLUS test strip TEST BLOOD SUGAR TWICE DAILY BEFORE BREAKFAST AND AT BEDTIME   acetaminophen (TYLENOL) 325 MG tablet Take 2 tablets (650 mg total) by mouth every 4 (four)  hours as needed for headache or mild pain.   allopurinol (ZYLOPRIM) 300 MG tablet Take 0.5 tablets (150 mg total) by mouth daily. (Patient taking differently: Take 150 mg by mouth daily. As needed.)   apixaban (ELIQUIS) 5 MG TABS tablet Take 1 tablet (5 mg total) by mouth 2 (two) times daily.   aspirin 81 MG chewable tablet Chew 1 tablet (81 mg total) by mouth daily.   benzonatate (TESSALON) 100 MG capsule Take 1 capsule (100 mg total) by mouth 3 (three) times daily as needed for cough.   carvedilol (COREG) 3.125 MG tablet TAKE 1 TABLET IN THE MORNING AND 2 TABLETS IN THE EVENING   colchicine 0.6 MG tablet Take 1 tablet (0.6 mg total) by  mouth daily as needed.   diphenoxylate-atropine (LOMOTIL) 2.5-0.025 MG tablet Take 1 tablet by mouth 4 (four) times daily as needed for diarrhea or loose stools.   insulin degludec (TRESIBA FLEXTOUCH) 100 UNIT/ML FlexTouch Pen Inject 20 Units into the skin daily.   Insulin Pen Needle (BD PEN NEEDLE NANO U/F) 32G X 4 MM MISC 1 each by Does not apply route 4 (four) times daily.   KLOR-CON M20 20 MEQ tablet TAKE 1.5 TABLETS (30 MEQ TOTAL) BY MOUTH 2 (TWO) TIMES DAILY.   loratadine (CLARITIN) 10 MG tablet Take 1 tablet (10 mg total) by mouth daily.   metolazone (ZAROXOLYN) 2.5 MG tablet Take 1 tablet (2.5 mg total) by mouth as directed. For wight of 200 lb and greater   Multiple Vitamins-Minerals (ONE-A-DAY MENS 50+) TABS Take 1 tablet by mouth daily with breakfast.   spironolactone (ALDACTONE) 25 MG tablet Take 0.5 tablets (12.5 mg total) by mouth daily.   torsemide (DEMADEX) 20 MG tablet Take 3 tablets (60 mg total) by mouth 2 (two) times daily.   TRULICITY 1.5 HK/0.6VP SOPN INJECT 1.5MG (1 PEN) SUBCUTANEOUSLY EVERY WEEK    Objective: BP (!) 120/54   Pulse (!) 57   Ht 6' (1.829 m)   Wt 201 lb 12.8 oz (91.5 kg)   BMI 27.37 kg/m   Physical Exam:  General: Elderly male.  No acute distress.  Gait:  Slow, steady gait.   Right shoulder without deformity.  No  atrophy. Forward flexion limited to 90 degrees.  Pain with abduction to 70 degrees.  Pain in empty can testing position.  Negative belly press.  Unable to get hand above the level of his shoulder.  Fingers are warm and well perfused.  2+ radial pulse. Sensation intact throughout hand    IMAGING: I personally ordered and reviewed the following images  XR of the right shoulder were obtained in clinic today and demonstrates no acute injury.  Chronic appearing arthropathy with sclerotic bone in the undersurface of the acromion.  Humeral head abuts the acromion.  No flattening of the humeral head.   Impression: right shoulder XR with evidence of chronic rotator cuff tear.    New Medications:  No orders of the defined types were placed in this encounter.     Mordecai Rasmussen, MD  09/15/2020 12:48 AM

## 2020-09-14 NOTE — Progress Notes (Signed)
Cardiac Individual Treatment Plan  Patient Details  Name: John [name] MRN: [id number] Date of Birth: 06-Sep-1945 Referring Provider:   Flowsheet Row CARDIAC REHAB PHASE II EXERCISE from 07/04/2020 in [facility]  Referring Provider taylor       Initial Encounter Date:  [facility] PHASE II EXERCISE from 07/04/2020 in [facility]  Date 07/04/20       Visit Diagnosis: S/P TAVR (transcatheter aortic valve replacement)  Patient's Home Medications on Admission:  Current Outpatient Medications:    ACCU-CHEK AVIVA PLUS test strip, TEST BLOOD SUGAR TWICE DAILY BEFORE BREAKFAST AND AT BEDTIME, Disp: 200 strip, Rfl: 1   acetaminophen (TYLENOL) 325 MG tablet, Take 2 tablets (650 mg total) by mouth every 4 (four) hours as needed for headache or mild pain., Disp: , Rfl:    allopurinol (ZYLOPRIM) 300 MG tablet, Take 0.5 tablets (150 mg total) by mouth daily. (Patient taking differently: Take 150 mg by mouth daily. As needed.), Disp: 15 tablet, Rfl: 6   apixaban (ELIQUIS) 5 MG TABS tablet, Take 1 tablet (5 mg total) by mouth 2 (two) times daily., Disp: 180 tablet, Rfl: 3   aspirin 81 MG chewable tablet, Chew 1 tablet (81 mg total) by mouth daily., Disp: , Rfl:    benzonatate (TESSALON) 100 MG capsule, Take 1 capsule (100 mg total) by mouth 3 (three) times daily as needed for cough., Disp: 90 capsule, Rfl: 3   carvedilol (COREG) 3.125 MG tablet, TAKE 1 TABLET IN THE MORNING AND 2 TABLETS IN THE EVENING, Disp: 270 tablet, Rfl: 6   colchicine 0.6 MG tablet, Take 1 tablet (0.6 mg total) by mouth daily as needed., Disp: 30 tablet, Rfl: 6   diphenoxylate-atropine (LOMOTIL) 2.5-0.025 MG tablet, Take 1 tablet by mouth 4 (four) times daily as needed for diarrhea or loose stools., Disp: , Rfl:    insulin degludec (TRESIBA FLEXTOUCH) 100 UNIT/ML FlexTouch Pen, Inject 20 Units into the skin daily., Disp: 15 mL, Rfl: 1   Insulin Pen Needle (BD PEN  NEEDLE NANO U/F) 32G X 4 MM MISC, 1 each by Does not apply route 4 (four) times daily., Disp: 150 each, Rfl: 5   KLOR-CON M20 20 MEQ tablet, TAKE 1.5 TABLETS (30 MEQ TOTAL) BY MOUTH 2 (TWO) TIMES DAILY., Disp: 270 tablet, Rfl: 3   loratadine (CLARITIN) 10 MG tablet, Take 1 tablet (10 mg total) by mouth daily., Disp: 30 tablet, Rfl: 2   metolazone (ZAROXOLYN) 2.5 MG tablet, Take 1 tablet (2.5 mg total) by mouth as directed. For wight of 200 lb and greater, Disp: 5 tablet, Rfl: 3   Multiple Vitamins-Minerals (ONE-A-DAY MENS 50+) TABS, Take 1 tablet by mouth daily with breakfast., Disp: , Rfl:    spironolactone (ALDACTONE) 25 MG tablet, Take 0.5 tablets (12.5 mg total) by mouth daily., Disp: 15 tablet, Rfl: 6   torsemide (DEMADEX) 20 MG tablet, Take 3 tablets (60 mg total) by mouth 2 (two) times daily., Disp: 180 tablet, Rfl: 3   TRULICITY 1.5 XK/4.8JE SOPN, INJECT 1.5MG (1 PEN) SUBCUTANEOUSLY EVERY WEEK, Disp: 2 mL, Rfl: 2  Past Medical History: Past Medical History:  Diagnosis Date   Atrial flutter ([facility]) 12/2010   Admitted with symptomatic bradycardia (HR 40s), atrial flutter with slow ventricular response 12/2010 + volume overload; AV nodal agents d/c'd and Pradaxa started; RFA in 01/2011   CHF (congestive heart failure) (HCC)    Chronic combined systolic and diastolic heart failure ([facility])    a. echo  01/06/11: mild LVH, EF 65-70%, mod to severe LAE, mild RVE, mild RAE, PASP 32;   TEE 10/12: EF 45-50% b. EF 45 to 50% by echo in 07/2018   Class 2 severe obesity due to excess calories with serious comorbidity and body mass index (BMI) of 37.0 to 37.9 in adult ([facility]) 07/17/2017   Diabetes mellitus    non insulin dependant   Hematoma, chest wall 05/15/2020   Hyperlipidemia    Hypertension    Osteoarthritis    Polyarticular gout 05/15/2020   Presence of permanent cardiac pacemaker    PSVT (paroxysmal supraventricular tachycardia) (HCC)    Possibly atrial flutter   S/P TAVR (transcatheter aortic  valve replacement) 05/03/2020   s/p TAVR with a 29 mm Edwards S3U via the subclavian approach with Dr. Angelena Form & Dr. Cyndia Bent    Severe aortic stenosis     Tobacco Use: Social History   Tobacco Use  Smoking Status Former   Packs/day: 1.00   Years: 10.00   Pack years: 10.00   Types: Cigarettes   Quit date: 03/26/1986   Years since quitting: 34.4  Smokeless Tobacco Never  Tobacco Comments   "stopped cigarette  smoking 1988"    Labs: Recent Review Flowsheet Data     Labs for ITP Cardiac and Pulmonary Rehab Latest Ref Rng & Units 05/12/2020 05/13/2020 05/14/2020 05/15/2020 08/25/2020   Cholestrol 0 - 200 mg/dL - - - - -   LDLCALC 0 - 99 mg/dL - - - - -   HDL >40 mg/dL - - - - -   Trlycerides <150 mg/dL - - - - -   Hemoglobin A1c 0.0 - 7.0 % - - - - 9.9(A)   PHART 7.350 - 7.450 - - - - -   PCO2ART 32.0 - 48.0 mmHg - - - - -   HCO3 20.0 - 28.0 mmol/L - - - - -   TCO2 22 - 32 mmol/L - - - - -   O2SAT % 67.9 60.2 73.3 65.9 -       Capillary Blood Glucose: Lab Results  Component Value Date   GLUCAP 396 (H) 06/30/2020   GLUCAP 179 (H) 06/08/2020   GLUCAP 200 (H) 05/15/2020   GLUCAP 253 (H) 05/15/2020   GLUCAP 136 (H) 05/15/2020    POCT Glucose     Row Name 06/30/20 1311 07/04/20 1416           POCT Blood Glucose   Pre-Exercise 396 mg/dL --      Pre-Exercise #2 -- 158 mg/dL               Exercise Target Goals: Exercise Program Goal: Individual exercise prescription set using results from initial 6 min walk test and THRR while considering  patient's activity barriers and safety.   Exercise Prescription Goal: Starting with aerobic activity 30 plus minutes a day, 3 days per week for initial exercise prescription. Provide home exercise prescription and guidelines that participant acknowledges understanding prior to discharge.  Activity Barriers & Risk Stratification:  Activity Barriers & Cardiac Risk Stratification - 06/30/20 1240       Activity Barriers & Cardiac  Risk Stratification   Activity Barriers Arthritis;Deconditioning;Decreased Ventricular Function    Cardiac Risk Stratification High             6 Minute Walk:  6 Minute Walk     Row Name 07/04/20 1442         6 Minute Walk   Phase Initial  Distance 600 feet     Walk Time 6 minutes     # of Rest Breaks 2     MPH 1.1     METS 1.28     RPE 12     VO2 Peak 4.49     Symptoms Yes (comment)     Comments left knee pain 3/10     Resting HR 66 bpm     Resting BP 108/56     Resting Oxygen Saturation  98 %     Exercise Oxygen Saturation  during 6 min walk 98 %     Max Ex. HR 87 bpm     Max Ex. BP 122/54     2 Minute Post BP 118/60              Oxygen Initial Assessment:   Oxygen Re-Evaluation:   Oxygen Discharge (Final Oxygen Re-Evaluation):   Initial Exercise Prescription:  Initial Exercise Prescription - 07/04/20 1400       Date of Initial Exercise RX and Referring Provider   Date 07/04/20    Referring Provider taylor    Expected Discharge Date 09/21/20      NuStep   Level 1    SPM 60    Minutes 22      Arm Ergometer   Level 1    RPM 40    Minutes 17      Prescription Details   Frequency (times per week) 3    Duration Progress to 30 minutes of continuous aerobic without signs/symptoms of physical distress      Intensity   THRR 40-80% of Max Heartrate 58-117    Ratings of Perceived Exertion 11-13      Progression   Progression Continue to progress workloads to maintain intensity without signs/symptoms of physical distress.      Resistance Training   Training Prescription Yes    Weight 3    Reps 10-15             Perform Capillary Blood Glucose checks as needed.  Exercise Prescription Changes:   Exercise Prescription Changes     Row Name 07/11/20 1500 07/15/20 1505 08/08/20 1500 08/17/20 1152 08/26/20 1550     Response to Exercise   Blood Pressure (Admit) 98/70 122/60 96/52 102/50 110/58   Blood Pressure (Exercise) 140/60  110/58 116/64 124/64 122/60   Blood Pressure (Exit) 124/62 114/68 110/70 100/60 112/50   Heart Rate (Admit) 62 bpm 87 bpm 78 bpm 64 bpm 60 bpm   Heart Rate (Exercise) 99 bpm 89 bpm 87 bpm 103 bpm 99 bpm   Heart Rate (Exit) 68 bpm 79 bpm 74 bpm 73 bpm 69 bpm   Rating of Perceived Exertion (Exercise) 13 13 13 15 12    Duration Continue with 30 min of aerobic exercise without signs/symptoms of physical distress. Continue with 30 min of aerobic exercise without signs/symptoms of physical distress. Continue with 30 min of aerobic exercise without signs/symptoms of physical distress. Continue with 30 min of aerobic exercise without signs/symptoms of physical distress. Continue with 30 min of aerobic exercise without signs/symptoms of physical distress.   Intensity THRR unchanged THRR unchanged THRR unchanged THRR unchanged THRR unchanged     Progression   Progression Continue to progress workloads to maintain intensity without signs/symptoms of physical distress. Continue to progress workloads to maintain intensity without signs/symptoms of physical distress. Continue to progress workloads to maintain intensity without signs/symptoms of physical distress. Continue to progress workloads to maintain intensity without signs/symptoms  of physical distress. Continue to progress workloads to maintain intensity without signs/symptoms of physical distress.     Resistance Training   Training Prescription Yes Yes Yes Yes Yes   Weight 3 lbs 3 lb 3 lbs 3 lbs 3 lbs   Reps 10-15 10-15 10-15 10-15 10-15   Time 10 Minutes 10 Minutes 10 Minutes 10 Minutes 10 Minutes     NuStep   Level 1 1 2 2 2    SPM 82 83 71 101 94   Minutes 17 17 17 17 17    METs 1.9 1.9 1.8 1.9 1.93     Arm Ergometer   Level 1 1 1 1 2    RPM 34 43 90 38 38   Minutes 22 22 22 22 22    METs 1.4 1.5 1.48 1.6 1.7            Exercise Comments:   Exercise Goals and Review:   Exercise Goals     Row Name 07/04/20 1448 07/25/20 1507  08/23/20 1153 09/12/20 1552       Exercise Goals   Increase Physical Activity Yes Yes Yes Yes    Intervention Develop an individualized exercise prescription for aerobic and resistive training based on initial evaluation findings, risk stratification, comorbidities and participant's personal goals.;Provide advice, education, support and counseling about physical activity/exercise needs. Develop an individualized exercise prescription for aerobic and resistive training based on initial evaluation findings, risk stratification, comorbidities and participant's personal goals.;Provide advice, education, support and counseling about physical activity/exercise needs. Develop an individualized exercise prescription for aerobic and resistive training based on initial evaluation findings, risk stratification, comorbidities and participant's personal goals.;Provide advice, education, support and counseling about physical activity/exercise needs. Develop an individualized exercise prescription for aerobic and resistive training based on initial evaluation findings, risk stratification, comorbidities and participant's personal goals.;Provide advice, education, support and counseling about physical activity/exercise needs.    Expected Outcomes Short Term: Attend rehab on a regular basis to increase amount of physical activity.;Long Term: Add in home exercise to make exercise part of routine and to increase amount of physical activity.;Long Term: Exercising regularly at least 3-5 days a week. Short Term: Attend rehab on a regular basis to increase amount of physical activity.;Long Term: Add in home exercise to make exercise part of routine and to increase amount of physical activity.;Long Term: Exercising regularly at least 3-5 days a week. Short Term: Attend rehab on a regular basis to increase amount of physical activity.;Long Term: Add in home exercise to make exercise part of routine and to increase amount of physical  activity.;Long Term: Exercising regularly at least 3-5 days a week. Short Term: Attend rehab on a regular basis to increase amount of physical activity.;Long Term: Add in home exercise to make exercise part of routine and to increase amount of physical activity.;Long Term: Exercising regularly at least 3-5 days a week.    Increase Strength and Stamina Yes Yes Yes Yes    Intervention Provide advice, education, support and counseling about physical activity/exercise needs.;Develop an individualized exercise prescription for aerobic and resistive training based on initial evaluation findings, risk stratification, comorbidities and participant's personal goals. Provide advice, education, support and counseling about physical activity/exercise needs.;Develop an individualized exercise prescription for aerobic and resistive training based on initial evaluation findings, risk stratification, comorbidities and participant's personal goals. Provide advice, education, support and counseling about physical activity/exercise needs.;Develop an individualized exercise prescription for aerobic and resistive training based on initial evaluation findings, risk stratification, comorbidities and participant's personal goals.  Provide advice, education, support and counseling about physical activity/exercise needs.;Develop an individualized exercise prescription for aerobic and resistive training based on initial evaluation findings, risk stratification, comorbidities and participant's personal goals.    Expected Outcomes Short Term: Increase workloads from initial exercise prescription for resistance, speed, and METs.;Short Term: Perform resistance training exercises routinely during rehab and add in resistance training at home;Long Term: Improve cardiorespiratory fitness, muscular endurance and strength as measured by increased METs and functional capacity (6MWT) Short Term: Increase workloads from initial exercise prescription for  resistance, speed, and METs.;Short Term: Perform resistance training exercises routinely during rehab and add in resistance training at home;Long Term: Improve cardiorespiratory fitness, muscular endurance and strength as measured by increased METs and functional capacity (6MWT) Short Term: Increase workloads from initial exercise prescription for resistance, speed, and METs.;Short Term: Perform resistance training exercises routinely during rehab and add in resistance training at home;Long Term: Improve cardiorespiratory fitness, muscular endurance and strength as measured by increased METs and functional capacity (6MWT) Short Term: Increase workloads from initial exercise prescription for resistance, speed, and METs.;Short Term: Perform resistance training exercises routinely during rehab and add in resistance training at home;Long Term: Improve cardiorespiratory fitness, muscular endurance and strength as measured by increased METs and functional capacity (6MWT)    Able to understand and use rate of perceived exertion (RPE) scale Yes Yes Yes Yes    Intervention Provide education and explanation on how to use RPE scale Provide education and explanation on how to use RPE scale Provide education and explanation on how to use RPE scale Provide education and explanation on how to use RPE scale    Expected Outcomes Short Term: Able to use RPE daily in rehab to express subjective intensity level;Long Term:  Able to use RPE to guide intensity level when exercising independently Short Term: Able to use RPE daily in rehab to express subjective intensity level;Long Term:  Able to use RPE to guide intensity level when exercising independently Short Term: Able to use RPE daily in rehab to express subjective intensity level;Long Term:  Able to use RPE to guide intensity level when exercising independently Short Term: Able to use RPE daily in rehab to express subjective intensity level;Long Term:  Able to use RPE to guide  intensity level when exercising independently    Knowledge and understanding of Target Heart Rate Range (THRR) Yes Yes Yes Yes    Intervention Provide education and explanation of THRR including how the numbers were predicted and where they are located for reference Provide education and explanation of THRR including how the numbers were predicted and where they are located for reference Provide education and explanation of THRR including how the numbers were predicted and where they are located for reference Provide education and explanation of THRR including how the numbers were predicted and where they are located for reference    Expected Outcomes Short Term: Able to state/look up THRR;Long Term: Able to use THRR to govern intensity when exercising independently;Short Term: Able to use daily as guideline for intensity in rehab Short Term: Able to state/look up THRR;Long Term: Able to use THRR to govern intensity when exercising independently;Short Term: Able to use daily as guideline for intensity in rehab Short Term: Able to state/look up THRR;Long Term: Able to use THRR to govern intensity when exercising independently;Short Term: Able to use daily as guideline for intensity in rehab Short Term: Able to state/look up THRR;Long Term: Able to use THRR to govern intensity when exercising independently;Short Term:  Able to use daily as guideline for intensity in rehab    Able to check pulse independently Yes Yes Yes Yes    Intervention Provide education and demonstration on how to check pulse in carotid and radial arteries.;Review the importance of being able to check your own pulse for safety during independent exercise Provide education and demonstration on how to check pulse in carotid and radial arteries.;Review the importance of being able to check your own pulse for safety during independent exercise Provide education and demonstration on how to check pulse in carotid and radial arteries.;Review the  importance of being able to check your own pulse for safety during independent exercise Provide education and demonstration on how to check pulse in carotid and radial arteries.;Review the importance of being able to check your own pulse for safety during independent exercise    Expected Outcomes Short Term: Able to explain why pulse checking is important during independent exercise;Long Term: Able to check pulse independently and accurately Short Term: Able to explain why pulse checking is important during independent exercise;Long Term: Able to check pulse independently and accurately Short Term: Able to explain why pulse checking is important during independent exercise;Long Term: Able to check pulse independently and accurately Short Term: Able to explain why pulse checking is important during independent exercise;Long Term: Able to check pulse independently and accurately    Understanding of Exercise Prescription Yes Yes Yes Yes    Intervention Provide education, explanation, and written materials on patient's individual exercise prescription Provide education, explanation, and written materials on patient's individual exercise prescription Provide education, explanation, and written materials on patient's individual exercise prescription Provide education, explanation, and written materials on patient's individual exercise prescription    Expected Outcomes Long Term: Able to explain home exercise prescription to exercise independently;Short Term: Able to explain program exercise prescription Long Term: Able to explain home exercise prescription to exercise independently;Short Term: Able to explain program exercise prescription Long Term: Able to explain home exercise prescription to exercise independently;Short Term: Able to explain program exercise prescription Long Term: Able to explain home exercise prescription to exercise independently;Short Term: Able to explain program exercise prescription              Exercise Goals Re-Evaluation :  Exercise Goals Re-Evaluation     Row Name 07/15/20 1507 08/23/20 1153 09/12/20 1552         Exercise Goal Re-Evaluation   Exercise Goals Review Increase Strength and Stamina;Increase Physical Activity;Able to understand and use rate of perceived exertion (RPE) scale;Knowledge and understanding of Target Heart Rate Range (THRR);Able to check pulse independently;Understanding of Exercise Prescription Increase Strength and Stamina;Increase Physical Activity;Able to understand and use rate of perceived exertion (RPE) scale;Knowledge and understanding of Target Heart Rate Range (THRR);Able to check pulse independently;Understanding of Exercise Prescription Increase Physical Activity;Increase Strength and Stamina;Able to understand and use rate of perceived exertion (RPE) scale;Knowledge and understanding of Target Heart Rate Range (THRR);Able to check pulse independently;Understanding of Exercise Prescription     Comments Patient has completed 5 exercise sessions. He has been tolerating exercise well. He has been progressing slowly. He has had inconsistent attendance. He is currently exercising at 1.9 METs on the NuStep. He is very positive in class and has a bright outlook on his goals. Will continue to monitor and progress as able. Patient has completed 10 exercise sessions. He has tolerated exercise well with little progressions. He has some trouble with his shoulders during the resistance training sessions. I give him alternative exercises  to complete for ones that bother his shoulder. His balance is also unstable as well as his lower body strength. Modifications are made to exercise prescription to accomodate. He is currently exercising at 1.9 METs on the NuStep. Will continue to monitor and progress as able. Patient has completed 13 sessions of cardiac rehab. He has mad little to no progress due to his infrequent attendance. He has now missed 5 sessions in a row  and frequently misses whole weeks at a time. He will graduate in a couple of weeks. When he attends, he exercises at 1.93 METs on the stepper. Will continue to monitor and progress as able.     Expected Outcomes Throughout exercise at rehab and at home patient will achieve their goals. Throughout exercise at rehab and at home patient will achieve their goals. Throughout exercise at rehab and at home patient will achieve their goals.               Discharge Exercise Prescription (Final Exercise Prescription Changes):  Exercise Prescription Changes - 08/26/20 1550       Response to Exercise   Blood Pressure (Admit) 110/58    Blood Pressure (Exercise) 122/60    Blood Pressure (Exit) 112/50    Heart Rate (Admit) 60 bpm    Heart Rate (Exercise) 99 bpm    Heart Rate (Exit) 69 bpm    Rating of Perceived Exertion (Exercise) 12    Duration Continue with 30 min of aerobic exercise without signs/symptoms of physical distress.    Intensity THRR unchanged      Progression   Progression Continue to progress workloads to maintain intensity without signs/symptoms of physical distress.      Resistance Training   Training Prescription Yes    Weight 3 lbs    Reps 10-15    Time 10 Minutes      NuStep   Level 2    SPM 94    Minutes 17    METs 1.93      Arm Ergometer   Level 2    RPM 38    Minutes 22    METs 1.7             Nutrition:  Target Goals: Understanding of nutrition guidelines, daily intake of sodium <1542m, cholesterol <2064m calories 30% from fat and 7% or less from saturated fats, daily to have 5 or more servings of fruits and vegetables.  Biometrics:  Pre Biometrics - 08/08/20 1552       Pre Biometrics   Weight 87.6 kg              Nutrition Therapy Plan and Nutrition Goals:  Nutrition Therapy & Goals - 07/20/20 0953       Personal Nutrition Goals   Comments Patient scored 22 on his diet assessment. We provide 2 educational sessions with handout on  heart healthy nutrition and offer RD referral if patient is interested.      Intervention Plan   Intervention Nutrition handout(s) given to patient.             Nutrition Assessments:  Nutrition Assessments - 06/30/20 1243       MEDFICTS Scores   Pre Score 23            MEDIFICTS Score Key: ?70 Need to make dietary changes  40-70 Heart Healthy Diet ? 40 Therapeutic Level Cholesterol Diet   Picture Your Plate Scores: <4<00nhealthy dietary pattern with much room for improvement. 41-50 Dietary pattern unlikely  to meet recommendations for good health and room for improvement. 51-60 More healthful dietary pattern, with some room for improvement.  >60 Healthy dietary pattern, although there may be some specific behaviors that could be improved.    Nutrition Goals Re-Evaluation:   Nutrition Goals Discharge (Final Nutrition Goals Re-Evaluation):   Psychosocial: Target Goals: Acknowledge presence or absence of significant depression and/or stress, maximize coping skills, provide positive support system. Participant is able to verbalize types and ability to use techniques and skills needed for reducing stress and depression.  Initial Review & Psychosocial Screening:  Initial Psych Review & Screening - 06/30/20 1241       Initial Review   Current issues with None Identified      Family Dynamics   Good Support System? Yes    Comments His wife is his support system.      Barriers   Psychosocial barriers to participate in program There are no identifiable barriers or psychosocial needs.      Screening Interventions   Interventions Encouraged to exercise    Expected Outcomes Long Term Goal: Stressors or current issues are controlled or eliminated.;Long Term goal: The participant improves quality of Life and PHQ9 Scores as seen by post scores and/or verbalization of changes             Quality of Life Scores:  Quality of Life - 07/04/20 1448       Quality of  Life   Select Quality of Life      Quality of Life Scores   Health/Function Pre 16.9 %    Socioeconomic Pre 27.58 %    Psych/Spiritual Pre 26.5 %    Family Pre 13.5 %    GLOBAL Pre 20.58 %            Scores of 19 and below usually indicate a poorer quality of life in these areas.  A difference of  2-3 points is a clinically meaningful difference.  A difference of 2-3 points in the total score of the Quality of Life Index has been associated with significant improvement in overall quality of life, self-image, physical symptoms, and general health in studies assessing change in quality of life.  PHQ-9: Recent Review Flowsheet Data     Depression screen [facility] 2/9 06/30/2020 04/15/2020 01/23/2019 11/24/2018 11/06/2018   Decreased Interest 0 0 0 0 0   Down, Depressed, Hopeless 0 0 0 0 0   PHQ - 2 Score 0 0 0 0 0   Altered sleeping 0 - - - -   Tired, decreased energy 1 - - - -   Change in appetite 0 - - - -   Feeling bad or failure about yourself  0 - - - -   Trouble concentrating 1 - - - -   Moving slowly or fidgety/restless 1 - - - -   Suicidal thoughts 0 - - - -   PHQ-9 Score 3 - - - -   Difficult doing work/chores Somewhat difficult - - - -      Interpretation of Total Score  Total Score Depression Severity:  1-4 = Minimal depression, 5-9 = Mild depression, 10-14 = Moderate depression, 15-19 = Moderately severe depression, 20-27 = Severe depression   Psychosocial Evaluation and Intervention:  Psychosocial Evaluation - 06/30/20 1306       Psychosocial Evaluation & Interventions   Interventions Encouraged to exercise with the program and follow exercise prescription    Comments Pt has no identifiable barriers to completing rehab.  He has no identifiable psychosocial issues, and his PHQ-9 score is a 3. He was not very talkative, so it was hard to pull information from him. He states that his wife is his support system, and did not list anyone else, nor go into any detail about this.  He states that his goal in the program is to get stronger. He was unable to do the physical portion of his orientation due to his blood sugar being 396. He states that this is uncommon.    Expected Outcomes The patient will continue to not have any identifiable psychosocial issues.    Continue Psychosocial Services  No Follow up required             Psychosocial Re-Evaluation:  Psychosocial Re-Evaluation     [place] Name 07/20/20 0034 08/12/20 1309 09/05/20 1427         Psychosocial Re-Evaluation   Current issues with None Identified None Identified None Identified     Comments Patient is new to the program completing 6 sessions. He continues to have no psychosocial issues identified. Will continue to monitor. Patient continues to have no psychosocial issues identified. He has completed 9 sesions. Will continue to monitor. Patient continues to have no psychosocial issues identified. He has completed 13 sesions. Will continue to monitor.     Expected Outcomes Patient will continue to have no psychosocial issues identified. Patient will continue to have no psychosocial issues identified. Patient will continue to have no psychosocial issues identified.     Interventions Stress management education;Encouraged to attend Cardiac Rehabilitation for the exercise;Relaxation education Stress management education;Encouraged to attend Cardiac Rehabilitation for the exercise;Relaxation education Stress management education;Encouraged to attend Cardiac Rehabilitation for the exercise;Relaxation education     Continue Psychosocial Services  No Follow up required No Follow up required No Follow up required              Psychosocial Discharge (Final Psychosocial Re-Evaluation):  Psychosocial Re-Evaluation - 09/05/20 1427       Psychosocial Re-Evaluation   Current issues with None Identified    Comments Patient continues to have no psychosocial issues identified. He has completed 13 sesions. Will  continue to monitor.    Expected Outcomes Patient will continue to have no psychosocial issues identified.    Interventions Stress management education;Encouraged to attend Cardiac Rehabilitation for the exercise;Relaxation education    Continue Psychosocial Services  No Follow up required             Vocational Rehabilitation: Provide vocational rehab assistance to qualifying candidates.   Vocational Rehab Evaluation & Intervention:  Vocational Rehab - 06/30/20 1243       Initial Vocational Rehab Evaluation & Intervention   Assessment shows need for Vocational Rehabilitation No      Vocational Rehab Re-Evaulation   Comments He is retired and has no desire to return to work             Education: Education Goals: Education classes will be provided on a weekly basis, covering required topics. Participant will state understanding/return demonstration of topics presented.  Learning Barriers/Preferences:  Learning Barriers/Preferences - 06/30/20 1247       Learning Barriers/Preferences   Learning Barriers None    Learning Preferences Audio;Video;Written Material;Skilled Demonstration             Education Topics: Hypertension, Hypertension Reduction -Define heart disease and high blood pressure. Discus how high blood pressure affects the body and ways to reduce high blood pressure.   Exercise and  Your Heart -Discuss why it is important to exercise, the FITT principles of exercise, normal and abnormal responses to exercise, and how to exercise safely. Flowsheet Row CARDIAC REHAB PHASE II EXERCISE from 08/24/2020 in [place]  Date 07/06/20  Educator mk  Instruction Review Code 2- Demonstrated Understanding       Angina -Discuss definition of angina, causes of angina, treatment of angina, and how to decrease risk of having angina. Flowsheet Row CARDIAC REHAB PHASE II EXERCISE from 08/24/2020 in [place]  Date  07/13/20  Educator mk  Instruction Review Code 2- Demonstrated Understanding       Cardiac Medications -Review what the following cardiac medications are used for, how they affect the body, and side effects that may occur when taking the medications.  Medications include Aspirin, Beta blockers, calcium channel blockers, ACE Inhibitors, angiotensin receptor blockers, diuretics, digoxin, and antihyperlipidemics.   Congestive Heart Failure -Discuss the definition of CHF, how to live with CHF, the signs and symptoms of CHF, and how keep track of weight and sodium intake.   Heart Disease and Intimacy -Discus the effect sexual activity has on the heart, how changes occur during intimacy as we age, and safety during sexual activity.   Smoking Cessation / COPD -Discuss different methods to quit smoking, the health benefits of quitting smoking, and the definition of COPD. Flowsheet Row CARDIAC REHAB PHASE II EXERCISE from 08/24/2020 in [place]  Date 08/10/20  Educator mk  Instruction Review Code 2- Demonstrated Understanding       Nutrition I: Fats -Discuss the types of cholesterol, what cholesterol does to the heart, and how cholesterol levels can be controlled. Flowsheet Row CARDIAC REHAB PHASE II EXERCISE from 08/24/2020 in [place]  Date 08/17/20  Educator DJ  Instruction Review Code 1- Verbalizes Understanding       Nutrition II: Labels -Discuss the different components of food labels and how to read food label [place] from 08/24/2020 in [place]  Date 08/24/20  Educator mk  Instruction Review Code 2- Demonstrated Understanding       Heart Parts/Heart Disease and PAD -Discuss the anatomy of the heart, the pathway of blood circulation through the heart, and these are affected by heart disease.   Stress I: Signs and Symptoms -Discuss the causes of stress, how  stress may lead to anxiety and depression, and ways to limit stress.   Stress II: Relaxation -Discuss different types of relaxation techniques to limit stress.   Warning Signs of Stroke / TIA -Discuss definition of a stroke, what the signs and symptoms are of a stroke, and how to identify when someone is having stroke.   Knowledge Questionnaire Score:  Knowledge Questionnaire Score - 06/30/20 1247       Knowledge Questionnaire Score   Pre Score 15/24             Core Components/Risk Factors/Patient Goals at Admission:  Personal Goals and Risk Factors at Admission - 06/30/20 1250       Core Components/Risk Factors/Patient Goals on Admission   Improve shortness of breath with ADL's Yes    Intervention Provide education, individualized exercise plan and daily activity instruction to help decrease symptoms of SOB with activities of daily living.    Expected Outcomes Short Term: Improve cardiorespiratory fitness to achieve a reduction of symptoms when performing ADLs;Long Term: Be able to perform more ADLs without symptoms or delay the  onset of symptoms    Diabetes Yes    Intervention Provide education about signs/symptoms and action to take for hypo/hyperglycemia.;Provide education about proper nutrition, including hydration, and aerobic/resistive exercise prescription along with prescribed medications to achieve blood glucose in normal ranges: Fasting glucose 65-99 mg/dL    Expected Outcomes Short Term: Participant verbalizes understanding of the signs/symptoms and immediate care of hyper/hypoglycemia, proper foot care and importance of medication, aerobic/resistive exercise and nutrition plan for blood glucose control.;Long Term: Attainment of HbA1C < 7%.    Heart Failure Yes    Intervention Provide a combined exercise and nutrition program that is supplemented with education, support and counseling about heart failure. Directed toward relieving symptoms such as shortness of breath,  decreased exercise tolerance, and extremity edema.    Expected Outcomes Improve functional capacity of life;Short term: Attendance in program 2-3 days a week with increased exercise capacity. Reported lower sodium intake. Reported increased fruit and vegetable intake. Reports medication compliance.;Short term: Daily weights obtained and reported for increase. Utilizing diuretic protocols set by physician.;Long term: Adoption of self-care skills and reduction of barriers for early signs and symptoms recognition and intervention leading to self-care maintenance.             Core Components/Risk Factors/Patient Goals Review:   Goals and Risk Factor Review     Row Name 07/20/20 0955 08/12/20 1309 09/05/20 1427         Core Components/Risk Factors/Patient Goals Review   Personal Goals Review Weight Management/Obesity;Diabetes Weight Management/Obesity;Diabetes Weight Management/Obesity;Diabetes     Review Patient is new to the program completing 6 sessions. He was referred to CR with [facility]. He has multiple risk factors for CAD and is participating in the program for risk modification. His last A1C on file was 05/03/20 at 5.7%. His blood pressure is well controlled. His personal goals for the program are to get stronger. We will continue to monitor his progress as he works toward meeting these goals. Patient has completed 9 sessions. He has missed several sessions due to being in volume overload. Dr. Haroldine Laws did not want him to return to CR until he got him back to euvolemic. He returned  08/05/20 and is doing well in the program. He says he still feels weak at times but feels better than he did. His personal goal for the program is to get stronger. We will continue to monitor him as he works toward meeting this goal. Patient has completed 13 sessions. His attendance remains inconsistent due to MD appointments and balance issues. He saw his endrocrinologist 08/25/20. His A1C was 9.9% up from 5.7% a few  months ago. He added back [place] 20 U qhs. His blood pressures have been well controlled. His personal goal for the program is to get stronger. We will continue to monitor his progress as he works towards meeting this goals.     Expected Outcomes Patient will complete the program meeting both personal and program goals. Patient will complete the program meeting both personal and program goals. Patient will complete the program meeting both personal and program goals.              Core Components/Risk Factors/Patient Goals at Discharge (Final Review):   Goals and Risk Factor Review - 09/05/20 1427       Core Components/Risk Factors/Patient Goals Review   Personal Goals Review Weight Management/Obesity;Diabetes    Review Patient has completed 13 sessions. His attendance remains inconsistent due to MD appointments and balance issues. He saw his  endrocrinologist 08/25/20. His A1C was 9.9% up from 5.7% a few months ago. He added back [place] 20 U qhs. His blood pressures have been well controlled. His personal goal for the program is to get stronger. We will continue to monitor his progress as he works towards meeting this goals.    Expected Outcomes Patient will complete the program meeting both personal and program goals.             ITP Comments:   Comments: ITP REVIEW   Patient's last day of attendance was 08/26/20. He has completed 14 sessions.

## 2020-09-14 NOTE — Patient Instructions (Signed)
Recommend tylenol and voltaren gel (available at the pharmacy) for the pain.  Ice can also help  If you are not getting better and want to try an injection, please contact the clinic to schedule another appointment.

## 2020-09-15 ENCOUNTER — Encounter: Payer: Self-pay | Admitting: "Endocrinology

## 2020-09-15 ENCOUNTER — Ambulatory Visit (INDEPENDENT_AMBULATORY_CARE_PROVIDER_SITE_OTHER): Payer: Medicare HMO | Admitting: "Endocrinology

## 2020-09-15 ENCOUNTER — Other Ambulatory Visit: Payer: Self-pay

## 2020-09-15 ENCOUNTER — Encounter: Payer: Self-pay | Admitting: Orthopedic Surgery

## 2020-09-15 VITALS — BP 118/58 | HR 72 | Ht 72.0 in | Wt 203.4 lb

## 2020-09-15 DIAGNOSIS — E782 Mixed hyperlipidemia: Secondary | ICD-10-CM | POA: Diagnosis not present

## 2020-09-15 DIAGNOSIS — I10 Essential (primary) hypertension: Secondary | ICD-10-CM | POA: Diagnosis not present

## 2020-09-15 DIAGNOSIS — E1159 Type 2 diabetes mellitus with other circulatory complications: Secondary | ICD-10-CM | POA: Diagnosis not present

## 2020-09-15 NOTE — Progress Notes (Signed)
09/15/2020, 5:31 PM   Endocrinology follow-up note    Subjective:    Patient ID: John [name], male    DOB: [birth year].  John [name] is here to follow-up for the management of his currently uncontrolled type 2 diabetes, hyperlipidemia.    PMD:  Erven Colla, DO.   Past Medical History:  Diagnosis Date   Atrial flutter ([place]) 12/2010   Admitted with symptomatic bradycardia (HR 40s), atrial flutter with slow ventricular response 12/2010 + volume overload; AV nodal agents d/c'd and Pradaxa started; RFA in 01/2011   CHF (congestive heart failure) (HCC)    Chronic combined systolic and diastolic heart failure ([place])    a. echo 01/06/11: mild LVH, EF 65-70%, mod to severe LAE, mild RVE, mild RAE, PASP 32;   TEE 10/12: EF 45-50% b. EF 45 to 50% by echo in 07/2018   Class 2 severe obesity due to excess calories with serious comorbidity and body mass index (BMI) of 37.0 to 37.9 in adult ([place]) 07/17/2017   Diabetes mellitus    non insulin dependant   Hematoma, chest wall 05/15/2020   Hyperlipidemia    Hypertension    Osteoarthritis    Polyarticular gout 05/15/2020   Presence of permanent cardiac pacemaker    PSVT (paroxysmal supraventricular tachycardia) (HCC)    Possibly atrial flutter   S/P TAVR (transcatheter aortic valve replacement) 05/03/2020   s/p TAVR with a 29 mm Edwards S3U via the subclavian approach with Dr. Angelena Form & Dr. Cyndia Bent    Severe aortic stenosis    Past Surgical History:  Procedure Laterality Date   ATRIAL FLUTTER ABLATION N/A 02/07/2011   Procedure: ATRIAL FLUTTER ABLATION;  Surgeon: Evans Lance, MD;  Location: [facility] CATH LAB;  Service: Cardiovascular;  Laterality: N/A;   BIV UPGRADE N/A 06/08/2020   Procedure: BIV PPM UPGRADE;  Surgeon: Evans Lance, MD;  Location: [place] CV LAB;  Service: Cardiovascular;  Laterality: N/A;   CARDIAC ELECTROPHYSIOLOGY STUDY  AND ABLATION  02/07/11   CARDIOVERSION N/A 03/23/2016   Procedure: CARDIOVERSION;  Surgeon: Evans Lance, MD;  Location: [place];  Service: Cardiovascular;  Laterality: N/A;   COLONOSCOPY N/A 04/17/2017   Procedure: COLONOSCOPY;  Surgeon: Rogene Houston, MD;  [facility];  Service: Endoscopy;  Laterality: N/A;  [place] N/A 08/29/2015   Procedure: Pacemaker Implant;  Surgeon: Evans Lance, MD;  Location: [place] CV LAB;  Service: Cardiovascular;  Laterality: N/A;   EP IMPLANTABLE DEVICE N/A 12/07/2015   Procedure: PPM Lead Revision/Repair;  Surgeon: Evans Lance, MD;  Location: [place] CV LAB;  Service: Cardiovascular;  Laterality: N/A;   KNEE ARTHROSCOPY  2005   left   LUMBAR SPINE SURGERY     "I've had 6 ORs 1972 thru 2004"   POLYPECTOMY  04/17/2017   Procedure: POLYPECTOMY;  Surgeon: Rogene Houston, MD;  [facility];  Service: Endoscopy;;  transverse colon x3;   RIGHT HEART CATH N/A 04/28/2020   Procedure: RIGHT HEART CATH;  Surgeon: Jolaine Artist, MD;  Location:  [place] INVASIVE CV LAB;  Service: Cardiovascular;  Laterality: N/A;   RIGHT/LEFT HEART CATH AND CORONARY ANGIOGRAPHY N/A 12/21/2019   Procedure: RIGHT/LEFT HEART CATH AND CORONARY ANGIOGRAPHY;  Surgeon: Nelva Bush, MD;  Location: [place] CV LAB;  Service: Cardiovascular;  Laterality: N/A;   TEE WITHOUT CARDIOVERSION N/A 11/05/2019   Procedure: TRANSESOPHAGEAL ECHOCARDIOGRAM (TEE) WITH PROPOFOL;  Surgeon: Arnoldo Lenis, MD;  [facility];  Service: Endoscopy;  Laterality: N/A;   TEE WITHOUT CARDIOVERSION N/A 05/03/2020   Procedure: TRANSESOPHAGEAL ECHOCARDIOGRAM (TEE);  Surgeon: Burnell Blanks, MD;  Location: [place];  Service: Open Heart Surgery;  Laterality: N/A;   Social History   Socioeconomic History   Marital status: Married    Spouse name: Not on file   Number of children: 2   Years of education: Not on file   Highest education  level: Not on file  Occupational History   Occupation: Designer, industrial/product    Employer: RETIRED  Tobacco Use   Smoking status: Former    Packs/day: 1.00    Years: 10.00    Pack years: 10.00    Types: Cigarettes    Quit date: 03/26/1986    Years since quitting: 34.4   Smokeless tobacco: Never   Tobacco comments:    "stopped cigarette  smoking 1988"  Vaping Use   Vaping Use: Never used  Substance and Sexual Activity   Alcohol use: Not Currently    Alcohol/week: 0.0 standard drinks    Comment: "quit alcohol ~ 2007"   Drug use: No   Sexual activity: Yes    Partners: Female  Other Topics Concern   Not on file  Social History Narrative   Not on file   Social Determinants of Health   Financial Resource Strain: Not on file  Food Insecurity: Not on file  Transportation Needs: Not on file  Physical Activity: Not on file  Stress: Not on file  Social Connections: Not on file   Outpatient Encounter Medications as of 09/15/2020  Medication Sig   ACCU-CHEK AVIVA PLUS test strip TEST BLOOD SUGAR TWICE DAILY BEFORE BREAKFAST AND AT BEDTIME   acetaminophen (TYLENOL) 325 MG tablet Take 2 tablets (650 mg total) by mouth every 4 (four) hours as needed for headache or mild pain.   allopurinol (ZYLOPRIM) 300 MG tablet Take 0.5 tablets (150 mg total) by mouth daily. (Patient taking differently: Take 150 mg by mouth daily. As needed.)   apixaban (ELIQUIS) 5 MG TABS tablet Take 1 tablet (5 mg total) by mouth 2 (two) times daily.   aspirin 81 MG chewable tablet Chew 1 tablet (81 mg total) by mouth daily.   benzonatate (TESSALON) 100 MG capsule Take 1 capsule (100 mg total) by mouth 3 (three) times daily as needed for cough.   carvedilol (COREG) 3.125 MG tablet TAKE 1 TABLET IN THE MORNING AND 2 TABLETS IN THE EVENING   colchicine 0.6 MG tablet Take 1 tablet (0.6 mg total) by mouth daily as needed.   diphenoxylate-atropine (LOMOTIL) 2.5-0.025 MG tablet Take 1 tablet by mouth 4 (four) times daily as  needed for diarrhea or loose stools.   insulin degludec (TRESIBA FLEXTOUCH) 100 UNIT/ML FlexTouch Pen Inject 20 Units into the skin daily.   Insulin Pen Needle (BD PEN NEEDLE NANO U/F) 32G X 4 MM MISC 1 each by Does not apply route 4 (four) times daily.   KLOR-CON M20 20 MEQ tablet TAKE 1.5 TABLETS (30 MEQ TOTAL) BY MOUTH 2 (TWO) TIMES DAILY.  loratadine (CLARITIN) 10 MG tablet Take 1 tablet (10 mg total) by mouth daily.   metolazone (ZAROXOLYN) 2.5 MG tablet Take 1 tablet (2.5 mg total) by mouth as directed. For wight of 200 lb and greater   Multiple Vitamins-Minerals (ONE-A-DAY MENS 50+) TABS Take 1 tablet by mouth daily with breakfast.   spironolactone (ALDACTONE) 25 MG tablet Take 0.5 tablets (12.5 mg total) by mouth daily.   torsemide (DEMADEX) 20 MG tablet Take 3 tablets (60 mg total) by mouth 2 (two) times daily.   TRULICITY 1.5 GE/3.6OQ SOPN INJECT 1.5MG (1 PEN) SUBCUTANEOUSLY EVERY WEEK   No facility-administered encounter medications on file as of 09/15/2020.    ALLERGIES: No Known Allergies  VACCINATION STATUS: Immunization History  Administered Date(s) Administered   Influenza,inj,Quad PF,6+ Mos 11/24/2014, 11/29/2015, 12/11/2016, 12/25/2017, 12/20/2018   Influenza-Unspecified 11/24/2013, 12/25/2019   Moderna Sars-Covid-2 Vaccination 05/03/2019, 06/03/2019, 01/23/2020   Pneumococcal Conjugate-13 11/19/2013   Pneumococcal Polysaccharide-23 11/29/2015   Pneumococcal-Unspecified 03/24/2009   Td 06/23/2010    Diabetes He presents for his follow-up (Telephone visit due to coronavirus pandemic) diabetic visit. He has type 2 diabetes mellitus. Onset time: He was diagnosed at approximate age of 25 years. His disease course has been improving. There are no hypoglycemic associated symptoms. Pertinent negatives for hypoglycemia include no confusion, pallor or seizures. There are no diabetic associated symptoms. Pertinent negatives for diabetes include no fatigue, no polydipsia, no  polyphagia, no polyuria and no weakness. There are no hypoglycemic complications. Symptoms are improving. Diabetic complications include heart disease and nephropathy. Risk factors for coronary artery disease include diabetes mellitus, dyslipidemia, family history, obesity, male sex, hypertension, sedentary lifestyle and tobacco exposure. Current diabetic treatments: He is on Trulicity 1.5 mg Septanest weekly, and Farxiga 10 mg p.o. daily. His weight is fluctuating minimally. He is following a generally unhealthy diet. When asked about meal planning, he reported none. He has not had a previous visit with a dietitian. He never participates in exercise. His home blood glucose trend is increasing steadily. His breakfast blood glucose range is generally 140-180 mg/dl. His bedtime blood glucose range is generally 180-200 mg/dl. His overall blood glucose range is 180-200 mg/dl. (He presents with improved glycemic profile.  His average blood glucose is improved from 210-172 over the last 30 days.  He is responding to his basal insulin.  His recent point-of-care A1c was 9.9%.  No hypoglycemia was documented.    ) An ACE inhibitor/angiotensin II receptor blocker is being taken. He does not see a podiatrist.Eye exam is current.  Hyperlipidemia This is a chronic problem. The current episode started more than 1 year ago. The problem is controlled. Exacerbating diseases include diabetes and obesity. Pertinent negatives include no myalgias. Current antihyperlipidemic treatment includes statins. Risk factors for coronary artery disease include diabetes mellitus, dyslipidemia, family history, obesity, male sex, hypertension and a sedentary lifestyle.    Review of systems  Constitutional: + He lost 30+ pounds.  He is recovering from cardiac surgery for aortic stenosis.   Current  Body mass index is 27.59 kg/m. Mar[place] Kitchen    Objective:    Vitals with BMI 09/15/2020 09/14/2020 09/13/2020  Height 6' 0"  6' 0"  6' 0"   Weight 203 lbs  6 oz 201 lbs 13 oz 205 lbs 3 oz  BMI 27.58 94.76 54.65  Systolic 035 465 681  Diastolic 58 54 56  Pulse 72 57 56     Physical Exam- Limited    CMP     Component Value Date/Time   NA  135 09/12/2020 1028   K 4.9 09/12/2020 1028   CL 95 (L) 09/12/2020 1028   CO2 23 09/12/2020 1028   GLUCOSE 224 (H) 09/12/2020 1028   GLUCOSE 273 (H) 09/06/2020 0952   BUN 19 09/12/2020 1028   CREATININE 1.24 09/12/2020 1028   CREATININE 1.32 (H) 03/07/2016 1043   CALCIUM 9.4 09/12/2020 1028   PROT 7.3 09/12/2020 1028   ALBUMIN 3.7 09/12/2020 1028   AST 42 (H) 09/12/2020 1028   ALT 28 09/12/2020 1028   ALKPHOS 307 (H) 09/12/2020 1028   BILITOT 2.4 (H) 09/12/2020 1028   GFRNONAA 49 (L) 09/06/2020 0952   GFRAA 52 (L) 04/04/2020 0934     Diabetic Labs (most recent): Lab Results  Component Value Date   HGBA1C 9.9 (A) 08/25/2020   HGBA1C 5.7 (H) 05/03/2020   HGBA1C 6.8 (H) 03/09/2020    Lipid Panel     Component Value Date/Time   CHOL 178 08/12/2019 0809   CHOL 142 01/17/2018 0835   TRIG 100 08/12/2019 0809   HDL 51 08/12/2019 0809   HDL 53 01/17/2018 0835   CHOLHDL 3.5 08/12/2019 0809   VLDL 20 08/12/2019 0809   LDLCALC 107 (H) 08/12/2019 0809   LDLCALC 71 01/17/2018 0835     Lab Results  Component Value Date   TSH 1.940 07/10/2017   TSH 1.563 08/25/2015   TSH 1.206 01/05/2011   FREET4 1.12 07/10/2017        Assessment & Plan:   1. DM type 2 causing vascular disease ([place])  - John [name] has currently uncontrolled symptomatic type 2 DM since 75 years of age.  He presents with improved glycemic profile.  His average blood glucose is improved from 210-172 over the last 30 days.  He is responding to his basal insulin.  His recent point-of-care A1c was 9.9%.  No hypoglycemia was documented.    -He does not report any major hypoglycemia. Recent labs reviewed.  -his diabetes is complicated by obesity/sedentary life , CKD, CHF , congestive heart failure/bradyarrhythmia  and KEIGHAN AMEZCUA remains at a high risk for more acute and chronic complications which include CAD, CVA, CKD, retinopathy, and neuropathy. These are all discussed in detail with the patient.  - I encouraged him to switch to  unprocessed or minimally processed complex starch and increased protein intake (animal or plant source), fruits, and vegetables.  - he acknowledges that there is a room for improvement in his food and drink choices. - Suggestion is made for him to avoid simple carbohydrates  from his diet including Cakes, Sweet Desserts, Ice Cream, Soda (diet and regular), Sweet Tea, Candies, Chips, Cookies, Store Bought Juices, Alcohol in Excess of  1-2 drinks a day, Artificial Sweeteners,  Coffee Creamer, and "Sugar-free" Products, Lemonade. This will help patient to have more stable blood glucose profile and potentially avoid unintended weight gain.   - he is advised to stick to a routine mealtimes to eat 3 meals  a day and avoid unnecessary snacks ( to snack only to correct hypoglycemia).    - I have approached him with the following individualized plan to manage diabetes and patient agrees:   -Based on his presentation with near target glycemic profile, he will continue to need basal insulin in light of his recent point-of-care A1c of 9.9%.   -He is advised to continue Tresiba 20 units nightly, associated with monitoring of blood glucose at least twice a day-daily before breakfast and at bedtime.   -He was advised  to discontinue Farxiga by his cardiologist due to rash in the genital area, which has since resolved. -He is advised to continue Trulicity 1.5 mg subcutaneously weekly.   -Patient is encouraged to call clinic for blood glucose levels less than 70 or above 200 mg /dl.   2) Lipids/HPL:   His recent lipid panel showed LDL of 71.  He will continue to benefit from statin treatment, advised to continue Crestor 20 mg p.o. nightly.  His advised on side effects and precautions.     3) hypertension- -His blood pressure is controlled to target at 118/58 -He is advised to continue on his torsemide as needed, carvedilol 25 mg p.o. twice daily.   4) weight management: His BMI is 26.77- not a candidate for major weight loss.  He lost enough weight recently.  He is advised to continue follow-up with his cardiologist.  - I advised patient to maintain close follow up with Erven Colla, DO for primary care needs.   I spent 32 minutes in the care of the patient today including review of labs from [place], Lipids, Thyroid Function, Hematology (current and previous including abstractions from other facilities); face-to-face time discussing  his blood glucose readings/logs, discussing hypoglycemia and hyperglycemia episodes and symptoms, medications doses, his options of short and long term treatment based on the latest standards of care / guidelines;  discussion about incorporating lifestyle medicine;  and documenting the encounter.    Please refer to Patient Instructions for Blood Glucose Monitoring and Insulin/Medications Dosing Guide"  in media tab for additional information. Please  also refer to " Patient Self Inventory" in the Media  tab for reviewed elements of pertinent patient history.  John [name] participated in the discussions, expressed understanding, and voiced agreement with the above plans.  All questions were answered to his satisfaction. he is encouraged to contact clinic should he have any questions or concerns prior to his return visit.    Follow up plan: - Return in about 3 months (around 12/16/2020) for Bring Meter and Logs- A1c in Office.  Glade Lloyd, MD [facility] [address] [place], [place] [address] Phone: [phone]  Fax: [phone]    09/15/2020, 5:31 PM  This note was partially dictated with voice recognition software. Similar sounding words can be transcribed inadequately or may  not  be corrected upon review.

## 2020-09-15 NOTE — Patient Instructions (Signed)
                                     Advice for Weight Management  -For most of us the best way to lose weight is by diet management. Generally speaking, diet management means consuming less calories intentionally which over time brings about progressive weight loss.  This can be achieved more effectively by restricting carbohydrate consumption to the minimum possible.  So, it is critically important to know your numbers: how much calorie you are consuming and how much calorie you need. More importantly, our carbohydrates sources should be unprocessed or minimally processed complex starch food items.   Sometimes, it is important to balance nutrition by increasing protein intake (animal or plant source), fruits, and vegetables.  - Whole Food, Plant Predominant Nutrition is highly recommended: Eat Plenty of vegetables, Mushrooms, fruits, Legumes, Whole Grains, Nuts, seeds in lieu of processed meats, processed snacks/pastries red meat, poultry, eggs.  -Sticking to a routine mealtime to eat 3 meals a day and avoiding unnecessary snacks is shown to have a big role in weight control. Under normal circumstances, the only time we lose real weight is when we are hungry, so allow hunger to take place- hunger means no food between meal times, only water.  It is not advisable to starve.   -It is better to avoid simple carbohydrates including: Cakes, Sweet Desserts, Ice Cream, Soda (diet and regular), Sweet Tea, Candies, Chips, Cookies, Store Bought Juices, Alcohol in Excess of  1-2 drinks a day, Lemonade,  Artificial Sweeteners, Doughnuts, Coffee Creamers, "Sugar-free" Products, etc, etc.  This is not a complete list.....    -Consulting with certified diabetes educators is proven to provide you with the most accurate and current information on diet.  Also, you may be  interested in discussing diet options/exchanges , we can schedule a visit with John [name], RDN, CDE for  individualized nutrition education.  -Exercise: If you are able: 30 -60 minutes a day ,4 days a week, or 150 minutes a week.  The longer the better.  Combine stretch, strength, and aerobic activities.  If you were told in the past that you have high risk for cardiovascular diseases, you may seek evaluation by your heart doctor prior to initiating moderate to intense exercise programs.                                  Additional Care Considerations for Diabetes   -Diabetes  is a chronic disease.  The most important care consideration is regular follow-up with your diabetes care provider with the goal being avoiding or delaying its complications and to take advantage of advances in medications and technology.    - Whole Food, Plant Predominant Nutrition is highly recommended: Eat Plenty of vegetables, Mushrooms, fruits, Legumes, Whole Grains, Nuts, seeds in lieu of processed meats, processed snacks/pastries red meat, poultry, eggs.  -Type 2 diabetes is known to coexist with other important comorbidities such as high blood pressure and high cholesterol.  It is critical to control not only the diabetes but also the high blood pressure and high cholesterol to minimize and delay the risk of complications including coronary artery disease, stroke, amputations, blindness, etc.    - Studies showed that people with diabetes will benefit from a class of medications known as ACE inhibitors and statins.  Unless   there are specific reasons not to be on these medications, the standard of care is to consider getting one from these groups of medications at an optimal doses.  These medications are generally considered safe and proven to help protect the heart and the kidneys.    - People with diabetes are encouraged to initiate and maintain regular follow-up with eye doctors, foot doctors, dentists , and if necessary heart and kidney doctors.     - It is highly recommended that people with diabetes quit smoking or  stay away from smoking, and get yearly  flu vaccine and pneumonia vaccine at least every 5 years.  One other important lifestyle recommendation is to ensure adequate sleep - at least 6-7 hours of uninterrupted sleep at night.  -Exercise: If you are able: 30 -60 minutes a day, 4 days a week, or 150 minutes a week.  The longer the better.  Combine stretch, strength, and aerobic activities.  If you were told in the past that you have high risk for cardiovascular diseases, you may seek evaluation by your heart doctor prior to initiating moderate to intense exercise programs.         

## 2020-09-16 ENCOUNTER — Encounter (HOSPITAL_COMMUNITY): Payer: Medicare HMO

## 2020-09-16 ENCOUNTER — Ambulatory Visit (HOSPITAL_COMMUNITY)
Admission: RE | Admit: 2020-09-16 | Discharge: 2020-09-16 | Disposition: A | Discharge status: Home / Self Care | Payer: Medicare HMO | Source: Ambulatory Visit | Attending: Gastroenterology | Admitting: Gastroenterology

## 2020-09-16 DIAGNOSIS — K7689 Other specified diseases of liver: Secondary | ICD-10-CM | POA: Diagnosis not present

## 2020-09-16 DIAGNOSIS — R7401 Elevation of levels of liver transaminase levels: Secondary | ICD-10-CM | POA: Insufficient documentation

## 2020-09-16 DIAGNOSIS — K8020 Calculus of gallbladder without cholecystitis without obstruction: Secondary | ICD-10-CM | POA: Diagnosis not present

## 2020-09-16 DIAGNOSIS — K7581 Nonalcoholic steatohepatitis (NASH): Secondary | ICD-10-CM | POA: Diagnosis not present

## 2020-09-17 LAB — CBC WITH DIFFERENTIAL/PLATELET
Basophils Absolute: 0.1 10*3/uL (ref 0.0–0.2)
Basos: 1 %
EOS (ABSOLUTE): 0.6 10*3/uL — ABNORMAL HIGH (ref 0.0–0.4)
Eos: 7 %
Hematocrit: 40.9 % (ref 37.5–51.0)
Hemoglobin: 14.6 g/dL (ref 13.0–17.7)
Immature Grans (Abs): 0.1 10*3/uL (ref 0.0–0.1)
Immature Granulocytes: 1 %
Lymphocytes Absolute: 1.1 10*3/uL (ref 0.7–3.1)
Lymphs: 15 %
MCH: 30.6 pg (ref 26.6–33.0)
MCHC: 35.7 g/dL (ref 31.5–35.7)
MCV: 86 fL (ref 79–97)
Monocytes Absolute: 0.8 10*3/uL (ref 0.1–0.9)
Monocytes: 11 %
Neutrophils Absolute: 5.2 10*3/uL (ref 1.4–7.0)
Neutrophils: 65 %
Platelets: 138 10*3/uL — ABNORMAL LOW (ref 150–450)
RBC: 4.77 x10E6/uL (ref 4.14–5.80)
RDW: 16.6 % — ABNORMAL HIGH (ref 11.6–15.4)
WBC: 7.8 10*3/uL (ref 3.4–10.8)

## 2020-09-17 LAB — COMPREHENSIVE METABOLIC PANEL
ALT: 28 IU/L (ref 0–44)
AST: 42 IU/L — ABNORMAL HIGH (ref 0–40)
Albumin/Globulin Ratio: 1 — ABNORMAL LOW (ref 1.2–2.2)
Albumin: 3.7 g/dL (ref 3.7–4.7)
Alkaline Phosphatase: 307 IU/L — ABNORMAL HIGH (ref 44–121)
BUN/Creatinine Ratio: 15 (ref 10–24)
BUN: 19 mg/dL (ref 8–27)
Bilirubin Total: 2.4 mg/dL — ABNORMAL HIGH (ref 0.0–1.2)
CO2: 23 mmol/L (ref 20–29)
Calcium: 9.4 mg/dL (ref 8.6–10.2)
Chloride: 95 mmol/L — ABNORMAL LOW (ref 96–106)
Creatinine, Ser: 1.24 mg/dL (ref 0.76–1.27)
Globulin, Total: 3.6 g/dL (ref 1.5–4.5)
Glucose: 224 mg/dL — ABNORMAL HIGH (ref 65–99)
Potassium: 4.9 mmol/L (ref 3.5–5.2)
Sodium: 135 mmol/L (ref 134–144)
Total Protein: 7.3 g/dL (ref 6.0–8.5)
eGFR: 61 mL/min/{1.73_m2} (ref >59)

## 2020-09-17 LAB — HEPATITIS A ANTIBODY, TOTAL: hep A Total Ab: NEGATIVE

## 2020-09-17 LAB — IGG: IgG (Immunoglobin G), Serum: 1912 mg/dL — ABNORMAL HIGH (ref 603–1613)

## 2020-09-17 LAB — PROTIME-INR
INR: 1.1 (ref 0.9–1.2)
Prothrombin Time: 11.6 s (ref 9.1–12.0)

## 2020-09-17 LAB — IGA: IgA/Immunoglobulin A, Serum: 595 mg/dL — ABNORMAL HIGH (ref 61–437)

## 2020-09-17 LAB — AFP TUMOR MARKER: AFP, Serum, Tumor Marker: 1.1 ng/mL (ref 0.0–8.4)

## 2020-09-17 LAB — TISSUE TRANSGLUTAMINASE, IGA: Transglutaminase IgA: <2 U/mL (ref 0–3)

## 2020-09-19 ENCOUNTER — Encounter (HOSPITAL_COMMUNITY): Payer: Medicare HMO

## 2020-09-20 ENCOUNTER — Ambulatory Visit (HOSPITAL_COMMUNITY)
Admission: RE | Admit: 2020-09-20 | Discharge: 2020-09-20 | Disposition: A | Discharge status: Home / Self Care | Payer: Medicare HMO | Source: Ambulatory Visit | Attending: Internal Medicine | Admitting: Internal Medicine

## 2020-09-20 ENCOUNTER — Encounter (HOSPITAL_COMMUNITY): Payer: Self-pay

## 2020-09-20 ENCOUNTER — Other Ambulatory Visit: Payer: Self-pay

## 2020-09-20 VITALS — BP 116/60 | HR 65 | Wt 201.8 lb

## 2020-09-20 DIAGNOSIS — Z952 Presence of prosthetic heart valve: Secondary | ICD-10-CM | POA: Diagnosis not present

## 2020-09-20 DIAGNOSIS — I5042 Chronic combined systolic (congestive) and diastolic (congestive) heart failure: Secondary | ICD-10-CM | POA: Diagnosis not present

## 2020-09-20 MED ORDER — METOLAZONE 2.5 MG PO TABS: 2.5000 mg | ORAL_TABLET | ORAL | 1 refills | Status: DC

## 2020-09-20 NOTE — Progress Notes (Signed)
Patient in for nurse visit today.He reports taking Metolazone this am. Patient states overall feels well and weight stable. Reviewed all with Allena Katz ,NP.Continue current regimen and let us know if you are taking Metolazone frequently or it's not helping. Patient aware and agreeable.

## 2020-09-20 NOTE — Patient Instructions (Addendum)
Good to see you today  No medication changes today.  Call office if you are taking Metolazone and weight is increasing.  Keep scheduled appointment in September  If you have any questions or concerns before your next appointment please send [place] a message through [place] or call our office at [phone].    TO LEAVE A MESSAGE FOR THE NURSE SELECT OPTION 2, PLEASE LEAVE A MESSAGE INCLUDING: YOUR NAME DATE OF BIRTH CALL BACK NUMBER REASON FOR CALL**this is important as we prioritize the call backs  YOU WILL RECEIVE A CALL BACK THE SAME DAY AS LONG AS YOU CALL BEFORE 4:00 PM  At the [facility], you and your health needs are our priority. As part of our continuing mission to provide you with exceptional heart care, we have created designated Provider Care Teams. These Care Teams include your primary Cardiologist (physician) and Advanced Practice Providers (APPs- Physician Assistants and Nurse Practitioners) who all work together to provide you with the care you need, when you need it.   You may see any of the following providers on your designated Care Team at your next follow up: Dr Glori Bickers Dr Loralie Champagne Dr Patrice Paradise, NP Lyda Jester, [place] Ginnie Smart Audry Riles, PharmD   Please be sure to bring in all your medications bottles to every appointment

## 2020-09-20 NOTE — Progress Notes (Signed)
ReDS Vest / Clip - 09/20/20 1000       ReDS Vest / Clip   Station Marker C    Ruler Value 27    ReDS Value Range Low volume    ReDS Actual Value 35

## 2020-09-20 NOTE — Addendum Note (Signed)
Encounter addended by: Stanford Scotland, RN on: 09/20/2020 11:09 AM  Actions taken: Order list changed, Pharmacy for encounter modified

## 2020-09-21 ENCOUNTER — Ambulatory Visit (INDEPENDENT_AMBULATORY_CARE_PROVIDER_SITE_OTHER): Payer: Medicare HMO | Admitting: *Deleted

## 2020-09-21 ENCOUNTER — Encounter (HOSPITAL_COMMUNITY): Payer: Medicare HMO

## 2020-09-21 DIAGNOSIS — I509 Heart failure, unspecified: Secondary | ICD-10-CM

## 2020-09-21 DIAGNOSIS — E1159 Type 2 diabetes mellitus with other circulatory complications: Secondary | ICD-10-CM | POA: Diagnosis not present

## 2020-09-21 NOTE — Chronic Care Management (AMB) (Signed)
Chronic Care Management   CCM RN Visit Note  09/21/2020 Name: John [name] MRN: [id number] DOB: 03-05-1946  Subjective: John [name] is a 75 y.o. year old male who is a primary care patient of Erven Colla, DO. The care management team was consulted for assistance with disease management and care coordination needs.    Engaged with patient by telephone for initial visit in response to provider referral for case management and/or care coordination services.   Consent to Services:  The patient was given the following information about Chronic Care Management services today, agreed to services, and gave verbal consent: 1. CCM service includes personalized support from designated clinical staff supervised by the primary care provider, including individualized plan of care and coordination with other care providers 2. 24/7 contact phone numbers for assistance for urgent and routine care needs. 3. Service will only be billed when office clinical staff spend 20 minutes or more in a month to coordinate care. 4. Only one practitioner may furnish and bill the service in a calendar month. 5.The patient may stop CCM services at any time (effective at the end of the month) by phone call to the office staff. 6. The patient will be responsible for cost sharing (co-pay) of up to 20% of the service fee (after annual deductible is met). Patient agreed to services and consent obtained.  Patient agreed to services and verbal consent obtained.   Assessment: Review of patient past medical history, allergies, medications, health status, including review of consultants reports, laboratory and other test data, was performed as part of comprehensive evaluation and provision of chronic care management services.   SDOH (Social Determinants of Health) assessments and interventions performed:  SDOH Interventions    Flowsheet Row Most Recent Value  SDOH Interventions   Food Insecurity Interventions Intervention  Not Indicated  Transportation Interventions Intervention Not Indicated        CCM Care Plan  No Known Allergies  Outpatient Encounter Medications as of 09/21/2020  Medication Sig   ACCU-CHEK AVIVA PLUS test strip TEST BLOOD SUGAR TWICE DAILY BEFORE BREAKFAST AND AT BEDTIME   acetaminophen (TYLENOL) 325 MG tablet Take 2 tablets (650 mg total) by mouth every 4 (four) hours as needed for headache or mild pain.   allopurinol (ZYLOPRIM) 300 MG tablet Take 0.5 tablets (150 mg total) by mouth daily.   apixaban (ELIQUIS) 5 MG TABS tablet Take 1 tablet (5 mg total) by mouth 2 (two) times daily.   aspirin 81 MG chewable tablet Chew 1 tablet (81 mg total) by mouth daily.   benzonatate (TESSALON) 100 MG capsule Take 1 capsule (100 mg total) by mouth 3 (three) times daily as needed for cough.   carvedilol (COREG) 3.125 MG tablet TAKE 1 TABLET IN THE MORNING AND 2 TABLETS IN THE EVENING   colchicine 0.6 MG tablet Take 1 tablet (0.6 mg total) by mouth daily as needed.   insulin degludec (TRESIBA FLEXTOUCH) 100 UNIT/ML FlexTouch Pen Inject 20 Units into the skin daily.   Insulin Pen Needle (BD PEN NEEDLE NANO U/F) 32G X 4 MM MISC 1 each by Does not apply route 4 (four) times daily.   KLOR-CON M20 20 MEQ tablet TAKE 1.5 TABLETS (30 MEQ TOTAL) BY MOUTH 2 (TWO) TIMES DAILY.   loratadine (CLARITIN) 10 MG tablet Take 1 tablet (10 mg total) by mouth daily.   metolazone (ZAROXOLYN) 2.5 MG tablet Take 1 tablet (2.5 mg total) by mouth as directed. For wight of 200 lb and greater  Multiple Vitamins-Minerals (ONE-A-DAY MENS 50+) TABS Take 1 tablet by mouth daily with breakfast.   spironolactone (ALDACTONE) 25 MG tablet Take 0.5 tablets (12.5 mg total) by mouth daily.   torsemide (DEMADEX) 20 MG tablet Take 3 tablets (60 mg total) by mouth 2 (two) times daily.   TRULICITY 1.5 GQ/6.7YP SOPN INJECT 1.5MG (1 PEN) SUBCUTANEOUSLY EVERY WEEK   diphenoxylate-atropine (LOMOTIL) 2.5-0.025 MG tablet Take 1 tablet by mouth 4  (four) times daily as needed for diarrhea or loose stools. (Patient not taking: Reported on 09/21/2020)   No facility-administered encounter medications on file as of 09/21/2020.    Patient Active Problem List   Diagnosis Date Noted   Hematoma, chest wall 05/15/2020   Polyarticular gout 05/15/2020   S/P TAVR (transcatheter aortic valve replacement) 05/03/2020   Severe aortic stenosis 04/25/2020   Chronic atrial fibrillation ([place]) 04/25/2020   Acute on chronic systolic CHF (congestive heart failure) ([place]) 04/25/2020   Lobar pneumonia ([place]) 03/10/2020   Permanent atrial fibrillation ([place]) 03/10/2020   Calculus of gallbladder without cholecystitis without obstruction    Thrombocytopenia ([place]) 03/09/2020   Hypoalbuminemia 03/09/2020   Dehydration 03/09/2020   Transaminitis 03/09/2020   Multifocal pneumonia 03/09/2020   Diffuse papular rash 03/09/2020   NASH (nonalcoholic steatohepatitis) 03/03/2020   Elevated LFTs 03/03/2020   Lower leg edema 10/14/2019   Class 2 obesity    Hyponatremia 11/11/2018   CKD (chronic kidney disease) stage 3, GFR 30-59 ml/min (HCC)    Paroxysmal atrial fibrillation ([place])    Pacemaker 03/02/2018   Atrial pacemaker lead displacement 12/07/2015   NSVT (nonsustained ventricular tachycardia) ([place]) 08/28/2015   Bradycardia 08/25/2015   Shoulder pain 07/14/2012   Current use of long term anticoagulation 05/04/2011   Atrial flutter ([place]) 01/05/2011   DM type 2 causing vascular disease ([place]) 12/23/2009   Mixed hyperlipidemia 12/23/2009   Essential hypertension, benign 12/23/2009   DEGENERATIVE JOINT DISEASE 12/23/2009    Conditions to be addressed/monitored:CHF and DMII  Care Plan : Diabetes Type 2 (Adult)  Updates made by Kassie Mends, RN since 09/21/2020 12:00 AM     Problem: Glycemic Management (Diabetes, Type 2)   Priority: Medium     Long-Range Goal: Glycemic Management Optimized   Start Date: 09/21/2020  Expected End Date: 03/23/2021  This  Visit's Progress: On track  Priority: Medium  Note:   Objective:  Lab Results  Component Value Date   HGBA1C 9.9 (A) 08/25/2020   Lab Results  Component Value Date   CREATININE 1.24 09/12/2020   CREATININE 1.48 (H) 09/06/2020   CREATININE 1.34 (H) 08/03/2020   Lab Results  Component Value Date   EGFR 61 09/12/2020   Current Barriers:  Knowledge Deficits related to basic Diabetes pathophysiology and self care/management- ADA diet, signs/ symptoms hypo and hyperglycemia Pt reports he checks CBG TID with fasting ranges 130-140's range, random ranges 160-200's.  Reports taking all medications as prescribed, is trying to do well with his diet. Case Manager Clinical Goal(s):  patient will demonstrate improved adherence to prescribed treatment plan for diabetes self care/management as evidenced by: daily monitoring and recording of CBG  adherence to ADA/ carb modified diet adherence to prescribed medication regimen contacting provider for new or worsened symptoms or questions Interventions:  Collaboration with Erven Colla, DO regarding development and update of comprehensive plan of care as evidenced by provider attestation and co-signature Inter-disciplinary care team collaboration (see longitudinal plan of care) Provided education to patient about basic DM disease process Reviewed medications with  patient and discussed importance of medication adherence Discussed plans with patient for ongoing care management follow up and provided patient with direct contact information for care management team Provided patient with written educational materials related to hypo and hyperglycemia and importance of correct treatment Reviewed scheduled/upcoming provider appointments including: 9/19 Dr. Haroldine Laws   9/26  Dr. Dorris Fetch   9/28  Dr. Harl Bowie Review of patient status, including review of consultants reports, relevant laboratory and other test results, and medications completed. Reviewed  carbohydrate modified diet and foods high in carbohydrates to limit Self-Care Activities - Attends all scheduled provider appointments Patient Goals: - manage portion size - read food labels for fat, fiber, carbohydrates and portion size - check feet daily for cuts, sores or redness - wear comfortable, cotton socks - wear comfortable, well-fitting shoes - check blood sugar at prescribed times - check blood sugar if I feel it is too high or too low - take the blood sugar log to all doctor visits - take the blood sugar meter to all doctor visits  - look over information on hypoglycemia sent via My Chart - be mindful of carbohydrate intake with foods such as bread, pasta, rice, etc that will elevate blood sugar Follow Up Plan: Telephone follow up appointment with care management team member scheduled for:    10/19/2020    Care Plan : Heart Failure (Adult)  Updates made by Kassie Mends, RN since 09/21/2020 12:00 AM     Problem: Symptom Exacerbation (Heart Failure)   Priority: High     Long-Range Goal: Symptom Exacerbation Prevented or Minimized   Start Date: 09/21/2020  Expected End Date: 03/23/2021  This Visit's Progress: On track  Priority: High  Note:   Current Barriers:  Knowledge deficit related to basic heart failure pathophysiology and self care management- needs reinforcement of HF action plan/ symptom management Pt reports he lives with spouse and is independent in all aspects of his care, he does do some walking, does not get out in the community for activities etc.  Has sister in law he can call on also if needed.  Pt reports he weighs daily with weight today 198 pounds, has all medications and taking as prescribed. Case Manager Clinical Goal(s):  patient will weigh self daily and record- ongoing patient will verbalize understanding of Heart Failure Action Plan and when to call doctor patient will take all Heart Failure mediations as prescribed patient will weigh daily  and record (notifying MD of 3 lb weight gain over night or 5 lb in a week) Interventions:  Collaboration with Erven Colla, DO regarding development and update of comprehensive plan of care as evidenced by provider attestation and co-signature Inter-disciplinary care team collaboration (see longitudinal plan of care) Basic overview and discussion of pathophysiology of Heart Failure reviewed  Reviewed Heart Failure Action Plan in depth and provided written copy Assessed need for readable accurate scales in home Provided education about placing scale on hard, flat surface Advised patient to weigh each morning after emptying bladder Discussed importance of daily weight and advised patient to weigh and record daily Reviewed role of diuretics in prevention of fluid overload and management of heart failure Reviewed importance of low sodium diet and avoiding salty snacks, fast food Self-Care Activities: Attends all scheduled provider appointments Calls provider office for new concerns or questions Patient Goals: - develop a rescue plan - eat more whole grains, fruits and vegetables, lean meats and healthy fats - follow rescue plan if symptoms flare-up - know  when to call the doctor- such as weight gain 3 pounds overnight or 5 pounds in one week, increased swelling. - track symptoms and what helps feel better or worse  - look over heart failure action plan sent via My Chart Follow Up Plan: Telephone follow up appointment with care management team member scheduled for:  10/19/2020      Plan:Telephone follow up appointment with care management team member scheduled for:  10/19/2020  Jacqlyn Larsen [facility], BSN RN Case Manager [place] [phone]

## 2020-09-21 NOTE — Patient Instructions (Signed)
Visit Information   PATIENT GOALS:   Goals Addressed             This Visit's Progress    Monitor and Manage My Blood Sugar-Diabetes Type 2       Timeframe:  Long-Range Goal Priority:  Medium Start Date:        09/21/2020                     Expected End Date:     03/23/2021                  Follow Up Date- 10/19/2020   - check blood sugar at prescribed times - check blood sugar if I feel it is too high or too low - take the blood sugar log to all doctor visits - take the blood sugar meter to all doctor visits  - look over information on hypoglycemia sent via My Chart - be mindful of carbohydrate intake with foods such as bread, pasta, rice, etc that will elevate blood sugar   Why is this important?   Checking your blood sugar at home helps to keep it from getting very high or very low.  Writing the results in a diary or log helps the doctor know how to care for you.  Your blood sugar log should have the time, date and the results.  Also, write down the amount of insulin or other medicine that you take.  Other information, like what you ate, exercise done and how you were feeling, will also be helpful.     Notes:       Track and Manage Symptoms-Heart Failure       Timeframe:  Long-Range Goal Priority:  High Start Date:             09/21/2020                Expected End Date:   03/23/2021                    Follow Up Date - 10/19/2020   - develop a rescue plan - eat more whole grains, fruits and vegetables, lean meats and healthy fats - follow rescue plan if symptoms flare-up - know when to call the doctor- such as weight gain 3 pounds overnight or 5 pounds in one week, increased swelling. - track symptoms and what helps feel better or worse  - look over heart failure action plan sent via My Chart   Why is this important?   You will be able to handle your symptoms better if you keep track of them.  Making some simple changes to your lifestyle will help.  Eating healthy  is one thing you can do to take good care of yourself.    Notes:          Consent to CCM Services: Mr. Gaunt was given information about Chronic Care Management services today including:  CCM service includes personalized support from designated clinical staff supervised by his physician, including individualized plan of care and coordination with other care providers 24/7 contact phone numbers for assistance for urgent and routine care needs. Service will only be billed when office clinical staff spend 20 minutes or more in a month to coordinate care. Only one practitioner may furnish and bill the service in a calendar month. The patient may stop CCM services at any time (effective at the end of the month) by phone call to the office staff.  The patient will be responsible for cost sharing (co-pay) of up to 20% of the service fee (after annual deductible is met).  Patient agreed to services and verbal consent obtained.   Patient verbalizes understanding of instructions provided today and agrees to view in [place].   Telephone follow up appointment with care management team member scheduled for:   7/27/2022Hypoglycemia Hypoglycemia is when the sugar (glucose) level in your blood is too low. Low blood sugar can happen to people who have diabetes and people who do not have diabetes. Low blood sugar can happenquickly, and it can be an emergency. What are the causes? This condition happens most often in people who have diabetes. It may be caused by: Diabetes medicine. Not eating enough, or not eating often enough. Doing more physical activity. Drinking alcohol on an empty stomach. If you do not have diabetes, this condition may be caused by: A tumor in the pancreas. Not eating enough, or not eating for long periods at a time (fasting). A very bad infection or illness. Problems after having weight loss (bariatric) surgery. Kidney failure or liver failure. Certain medicines. What  increases the risk? This condition is more likely to develop in people who: Have diabetes and take medicines to lower their blood sugar. Abuse alcohol. Have a very bad illness. What are the signs or symptoms? Mild Hunger. Sweating and feeling clammy. Feeling dizzy or light-headed. Being sleepy or having trouble sleeping. Feeling like you may vomit (nauseous). A fast heartbeat. A headache. Blurry vision. Mood changes, such as: Being grouchy. Feeling worried or nervous (anxious). Tingling or loss of feeling (numbness) around your mouth, lips, or tongue. Moderate Confusion and poor judgment. Behavior changes. Weakness. Uneven heartbeat. Trouble with moving (coordination). Very low Very low blood sugar (severe hypoglycemia) is a medical emergency. It can cause: Fainting. Seizures. Loss of consciousness (coma). Death. How is this treated? Treating low blood sugar Low blood sugar is often treated by eating or drinking something that has sugar in it right away. The food or drink should contain 15 grams of a fast-acting carb (carbohydrate). Options include: 4 oz (120 mL) of fruit juice. 4 oz (120 mL) of regular soda (not diet soda). A few pieces of hard candy. Check food labels to see how many pieces to eat for 15 grams. 1 Tbsp (15 mL) of sugar or honey. 4 glucose tablets. 1 tube of glucose gel. Treating low blood sugar if you have diabetes If you can think clearly and swallow safely, follow the 15:15 rule: Take 15 grams of a fast-acting carb. Talk with your doctor about how much you should take. Always keep a source of fast-acting carb with you, such as: Glucose tablets (take 4 tablets). A few pieces of hard candy. Check food labels to see how many pieces to eat for 15 grams. 4 oz (120 mL) of fruit juice. 4 oz (120 mL) of regular soda (not diet soda). 1 Tbsp (15 mL) of honey or sugar. 1 tube of glucose gel. Check your blood sugar 15 minutes after you take the carb. If your  blood sugar is still at or below 70 mg/dL (3.9 mmol/L), take 15 grams of a carb again. If your blood sugar does not go above 70 mg/dL (3.9 mmol/L) after 3 tries, get help right away. After your blood sugar goes back to normal, eat a meal or a snack within 1 hour.  Treating very low blood sugar If your blood sugar is below 54 mg/dL (3 mmol/L), you have very  low blood sugar, or severe hypoglycemia. This is an emergency. Get medical help right away. If you have very low blood sugar and you cannot eat or drink, you will need to be given a hormone called glucagon. A family member or friend should learn how to check your blood sugar and how to give you glucagon. Ask your doctor if youneed to have an emergency glucagon kit at home. Very low blood sugar may also need to be treated in a hospital. Follow these instructions at home: General instructions Take over-the-counter and prescription medicines only as told by your doctor. Stay aware of your blood sugar as told by your doctor. If you drink alcohol: Limit how much you have to: 0-1 drink a day for women who are not pregnant. 0-2 drinks a day for men. Know how much alcohol is in your drink. In the [place] one drink equals one 12 oz bottle of beer (355 mL), one 5 oz glass of wine (148 mL), or one 1 oz glass of hard liquor (44 mL). Be sure to eat food when you drink alcohol. Know that your body absorbs alcohol quickly. This may lead to low blood sugar later. Be sure to keep checking your blood sugar. Keep all follow-up visits. If you have diabetes:  Always have a fast-acting carb (15 grams) with you to treat low blood sugar. Follow your diabetes care plan as told by your doctor. Make sure you: Know the symptoms of low blood sugar. Check your blood sugar as often as told. Always check it before and after exercise. Always check your blood sugar before you drive. Take your medicines as told. Follow your meal plan. Eat on time. Do not skip  meals. Share your diabetes care plan with: Your work or school. People you live with. Carry a card or wear jewelry that says you have diabetes.  Where to find more information American Diabetes Association: www.diabetes.org Contact a doctor if: You have trouble keeping your blood sugar in your target range. You have low blood sugar often. Get help right away if: You still have symptoms after you eat or drink something that contains 15 grams of fast-acting carb, and you cannot get your blood sugar above 70 mg/dL by following the 15:15 rule. Your blood sugar is below 54 mg/dL (3 mmol/L). You have a seizure. You faint. These symptoms may be an emergency. Get help right away. Call your local emergency services (911 in the [place] Do not wait to see if the symptoms will go away. Do not drive yourself to the hospital. Summary Hypoglycemia happens when the level of sugar (glucose) in your blood is too low. Low blood sugar can happen to people who have diabetes and people who do not have diabetes. Low blood sugar can happen quickly, and it can be an emergency. Make sure you know the symptoms of low blood sugar and know how to treat it. Always keep a source of sugar (fast-acting carb) with you to treat low blood sugar. This information is not intended to replace advice given to you by your health care provider. Make sure you discuss any questions you have with your healthcare provider. Document Revised: 02/11/2020 Document Reviewed: 02/11/2020 Elsevier Patient Education  2022 [place]. Heart Failure Exacerbation  Heart failure is a condition in which the heart has trouble pumping blood. This may mean that the heart cannot pump enough blood out to the body or that the heart does not fill up with enough blood. When this happens, parts  of the body do not get the blood and oxygen they need to function properly. This can cause symptoms such as breathing problems, tiredness (fatigue), swelling, and  confusion. Heart failure exacerbation refers to heart failure symptoms that get worse. The symptoms may get worse suddenly or develop slowly over time. Heart failureexacerbation is a serious medical problem that should be treated right away. What are the causes? A heart failure exacerbation can be triggered by: Not taking your heart failure medicines correctly. Infections. Eating an unhealthy diet or a diet that is high in salt (sodium). Drinking too much fluid. Drinking alcohol. Using drugs, such as cocaine or methamphetamine. Not exercising. Other causes include: Other heart conditions such as an irregular heart rhythm (arrhythmia). Worsening heart valve function. Low blood counts (anemia). Other medical problems, such as kidney failure, thyroid problems, or diabetes mellitus. Sometimes the cause of the exacerbation is not known. What are the signs or symptoms? When heart failure symptoms suddenly or slowly get worse, this may be a sign of heart failure exacerbation. Symptoms of heart failure include: Shortness of breath during activity or exercise. A cough that does not go away. Swelling of the legs, ankles, feet, or abdomen. Losing or gaining weight for no reason. Trouble breathing when lying down. Increased heart rate or irregular heartbeat. Fatigue. Feeling light-headed, dizzy, or close to fainting. Nausea or lack of appetite. How is this diagnosed? This condition is diagnosed based on: Your symptoms and medical history. A physical exam. You may also have tests, including: Electrocardiogram (ECG). This test measures the electrical activity of your heart. Echocardiogram. This test uses sound waves to take a picture of your heart to see how well it works. Blood tests. Imaging tests, such as: Chest X-ray. MRI. Ultrasound. Stress test. This test examines how well your heart functions while you exercise on a treadmill or exercise bike. If you cannot exercise, medicines may be  used to increase your heartbeat in place of exercise. Cardiac catheterization. During this test, a thin, flexible tube (catheter) is inserted into a blood vessel and threaded up to your heart. This test allows your health care provider to check the arteries that lead to your heart (coronary arteries). Right heart catheterization. During this test, the pressure in your heart is measured. How is this treated? This condition may be treated by: Adjusting your heart medicines. Maintaining a healthy lifestyle. This includes: Eating a heart-healthy diet that is low in sodium. Not using products that contain nicotine or tobacco. Regular exercise. Monitoring your fluid intake. Monitoring your weight and reporting changes to your health care provider. Not using alcohol or drugs. Treating sleep apnea, if you have this condition. Surgery. This may include: Placing a pacemaker to improve heart function (cardiac resynchronization therapy). Implanting a device that can correct heart rhythm problems (implantable cardioverter defibrillator). Implanting a pulmonary arterial pressure monitor to monitor your fluid balance. Connecting a device to your heart to help it pump blood (ventricular assist device). Heart transplant. Follow these instructions at home: Medicines Take over-the-counter and prescription medicines only as told by your health care provider. Do not stop taking your medicines or change the amount you take. If you are having problems or side effects from your medicines, talk to your health care provider. If you are having difficulty paying for your medicines, contact a social worker or your clinic. There are many programs to assist with medicine costs. Talk to your health care provider before starting any new medicines or supplements. Make sure your health care  provider and pharmacist have a list of all the medicines you are taking. Eating and drinking  Avoid drinking alcohol. Eat a  heart-healthy diet as told by your health care provider. This includes: Plenty of fruits and vegetables. Lean proteins. Low-fat dairy. Whole grains. Foods that are low in sodium.  Activity  Exercise regularly as told by your health care provider. Balance exercise with rest. Ask your health care provider what activities are safe for you. This includes sexual activity, exercise, and daily tasks at home or work.  Lifestyle Do not use any products that contain nicotine or tobacco. These products include cigarettes, chewing tobacco, and vaping devices, such as e-cigarettes. If you need help quitting, ask your health care provider. Maintain a healthy weight. Ask your health care provider what weight is healthy for you. Consider joining a patient support group. This can help with emotional problems you may have, such as stress and anxiety. Do not use drugs. General instructions Stay up to date with vaccines. Talk to your health care provider about flu and pneumonia vaccines. Keep a list of medicines that you are taking. This may help in emergency situations. Keep all follow-up visits. This is important. Contact a health care provider if: You have questions about your medicines or you miss a dose. You feel anxious, depressed, or stressed. You develop swelling in your feet, ankles, legs, or abdomen. You develop a cough. You have a fever. You have trouble sleeping. You gain 2-3 lb (1-1.4 kg) in 24 hours or 5 lb (2.3 kg) in a week. Get help right away if: You have chest pain or pressure. You have shortness of breath while resting. You have severe fatigue. You are confused. You have severe dizziness. You have a rapid or irregular heartbeat. You have nausea or you vomit. You have a cough that is worse at night or you cannot lie flat. You have severe depression or sadness. These symptoms may represent a serious problem that is an emergency. Do not wait to see if the symptoms will go away.  Get medical help right away. Call your local emergency services (911 in the [place] Do not drive yourself to the hospital. Summary When heart failure symptoms get worse, it is called heart failure exacerbation. Common causes of this condition include taking medicines incorrectly, infections, and drinking alcohol. This condition may be treated by adjusting medicines, maintaining a healthy lifestyle, or surgery. Do not stop taking your medicines or change the amount you take. If you are having problems or side effects from your medicines, talk to your health care provider. This information is not intended to replace advice given to you by your health care provider. Make sure you discuss any questions you have with your healthcare provider. Document Revised: 10/03/2019 Document Reviewed: 10/03/2019 Elsevier Patient Education  2022 [place] [facility] [place], BSN RN Case Manager [facility] [phone]   CLINICAL CARE PLAN: Patient Care Plan: Diabetes Type 2 (Adult)     Problem Identified: Glycemic Management (Diabetes, Type 2)   Priority: Medium     Long-Range Goal: Glycemic Management Optimized   Start Date: 09/21/2020  Expected End Date: 03/23/2021  This Visit's Progress: On track  Priority: Medium  Note:   Objective:  Lab Results  Component Value Date   HGBA1C 9.9 (A) 08/25/2020   Lab Results  Component Value Date   CREATININE 1.24 09/12/2020   CREATININE 1.48 (H) 09/06/2020   CREATININE 1.34 (H) 08/03/2020   Lab Results  Component Value  Date   EGFR 61 09/12/2020   Current Barriers:  Knowledge Deficits related to basic Diabetes pathophysiology and self care/management- ADA diet, signs/ symptoms hypo and hyperglycemia Pt reports he checks CBG TID with fasting ranges 130-140's range, random ranges 160-200's.  Reports taking all medications as prescribed, is trying to do well with his diet. Case Manager Clinical Goal(s):  patient will demonstrate  improved adherence to prescribed treatment plan for diabetes self care/management as evidenced by: daily monitoring and recording of CBG  adherence to ADA/ carb modified diet adherence to prescribed medication regimen contacting provider for new or worsened symptoms or questions Interventions:  Collaboration with Erven Colla, DO regarding development and update of comprehensive plan of care as evidenced by provider attestation and co-signature Inter-disciplinary care team collaboration (see longitudinal plan of care) Provided education to patient about basic DM disease process Reviewed medications with patient and discussed importance of medication adherence Discussed plans with patient for ongoing care management follow up and provided patient with direct contact information for care management team Provided patient with written educational materials related to hypo and hyperglycemia and importance of correct treatment Reviewed scheduled/upcoming provider appointments including: 9/19 Dr. Haroldine Laws   9/26  Dr. Dorris Fetch   9/28  Dr. Harl Bowie Review of patient status, including review of consultants reports, relevant laboratory and other test results, and medications completed. Reviewed carbohydrate modified diet and foods high in carbohydrates to limit Self-Care Activities - Attends all scheduled provider appointments Patient Goals: - manage portion size - read food labels for fat, fiber, carbohydrates and portion size - check feet daily for cuts, sores or redness - wear comfortable, cotton socks - wear comfortable, well-fitting shoes - check blood sugar at prescribed times - check blood sugar if I feel it is too high or too low - take the blood sugar log to all doctor visits - take the blood sugar meter to all doctor visits  - look over information on hypoglycemia sent via My Chart - be mindful of carbohydrate intake with foods such as bread, pasta, rice, etc that will elevate blood  sugar Follow Up Plan: Telephone follow up appointment with care management team member scheduled for:    10/19/2020    Patient Care Plan: Heart Failure (Adult)     Problem Identified: Symptom Exacerbation (Heart Failure)   Priority: High     Long-Range Goal: Symptom Exacerbation Prevented or Minimized   Start Date: 09/21/2020  Expected End Date: 03/23/2021  This Visit's Progress: On track  Priority: High  Note:   Current Barriers:  Knowledge deficit related to basic heart failure pathophysiology and self care management- needs reinforcement of HF action plan/ symptom management Pt reports he lives with spouse and is independent in all aspects of his care, he does do some walking, does not get out in the community for activities etc.  Has sister in law he can call on also if needed.  Pt reports he weighs daily with weight today 198 pounds, has all medications and taking as prescribed. Case Manager Clinical Goal(s):  patient will weigh self daily and record- ongoing patient will verbalize understanding of Heart Failure Action Plan and when to call doctor patient will take all Heart Failure mediations as prescribed patient will weigh daily and record (notifying MD of 3 lb weight gain over night or 5 lb in a week) Interventions:  Collaboration with Erven Colla, DO regarding development and update of comprehensive plan of care as evidenced by provider attestation and co-signature Inter-disciplinary  care team collaboration (see longitudinal plan of care) Basic overview and discussion of pathophysiology of Heart Failure reviewed  Reviewed Heart Failure Action Plan in depth and provided written copy Assessed need for readable accurate scales in home Provided education about placing scale on hard, flat surface Advised patient to weigh each morning after emptying bladder Discussed importance of daily weight and advised patient to weigh and record daily Reviewed role of diuretics in  prevention of fluid overload and management of heart failure Reviewed importance of low sodium diet and avoiding salty snacks, fast food Self-Care Activities: Attends all scheduled provider appointments Calls provider office for new concerns or questions Patient Goals: - develop a rescue plan - eat more whole grains, fruits and vegetables, lean meats and healthy fats - follow rescue plan if symptoms flare-up - know when to call the doctor- such as weight gain 3 pounds overnight or 5 pounds in one week, increased swelling. - track symptoms and what helps feel better or worse  - look over heart failure action plan sent via My Chart Follow Up Plan: Telephone follow up appointment with care management team member scheduled for:  10/19/2020

## 2020-09-23 ENCOUNTER — Encounter (HOSPITAL_COMMUNITY): Payer: Medicare HMO

## 2020-09-27 ENCOUNTER — Telehealth: Payer: Self-pay | Admitting: Internal Medicine

## 2020-09-27 MED ORDER — BENZONATATE 100 MG PO CAPS
100.0000 mg | ORAL_CAPSULE | Freq: Three times a day (TID) | ORAL | 11 refills | Status: AC | PRN
Start: 2020-09-27 — End: ?

## 2020-09-27 NOTE — Progress Notes (Signed)
Discharge Progress Report  Patient Details  Name: John [name] MRN: [id number] Date of Birth: 1945-08-05 Referring Provider:   Flowsheet Row CARDIAC REHAB PHASE II EXERCISE from 07/04/2020 in [facility]  Referring Provider taylor        Number of Visits: 13  Reason for Discharge:  Early Exit:  Lack of attendance  Smoking History:  Social History   Tobacco Use  Smoking Status Former   Packs/day: 1.00   Years: 10.00   Pack years: 10.00   Types: Cigarettes   Quit date: 03/26/1986   Years since quitting: 34.5  Smokeless Tobacco Never  Tobacco Comments   "stopped cigarette  smoking 1988"    Diagnosis:  S/P TAVR (transcatheter aortic valve replacement)  ADL [facility]:   Initial Exercise Prescription:  Initial Exercise Prescription - 07/04/20 1400       Date of Initial Exercise RX and Referring Provider   Date 07/04/20    Referring Provider taylor    Expected Discharge Date 09/21/20      NuStep   Level 1    SPM 60    Minutes 22      Arm Ergometer   Level 1    RPM 40    Minutes 17      Prescription Details   Frequency (times per week) 3    Duration Progress to 30 minutes of continuous aerobic without signs/symptoms of physical distress      Intensity   THRR 40-80% of Max Heartrate 58-117    Ratings of Perceived Exertion 11-13      Progression   Progression Continue to progress workloads to maintain intensity without signs/symptoms of physical distress.      Resistance Training   Training Prescription Yes    Weight 3    Reps 10-15             Discharge Exercise Prescription (Final Exercise Prescription Changes):  Exercise Prescription Changes - 08/26/20 1550       Response to Exercise   Blood Pressure (Admit) 110/58    Blood Pressure (Exercise) 122/60    Blood Pressure (Exit) 112/50    Heart Rate (Admit) 60 bpm    Heart Rate (Exercise) 99 bpm    Heart Rate (Exit) 69 bpm    Rating of Perceived Exertion (Exercise) 12     Duration Continue with 30 min of aerobic exercise without signs/symptoms of physical distress.    Intensity THRR unchanged      Progression   Progression Continue to progress workloads to maintain intensity without signs/symptoms of physical distress.      Resistance Training   Training Prescription Yes    Weight 3 lbs    Reps 10-15    Time 10 Minutes      NuStep   Level 2    SPM 94    Minutes 17    METs 1.93      Arm Ergometer   Level 2    RPM 38    Minutes 22    METs 1.7             Functional Capacity:  6 Minute Walk     Row Name 07/04/20 1442         6 Minute Walk   Phase Initial     Distance 600 feet     Walk Time 6 minutes     # of Rest Breaks 2     MPH 1.1     METS 1.28  RPE 12     VO2 Peak 4.49     Symptoms Yes (comment)     Comments left knee pain 3/10     Resting HR 66 bpm     Resting BP 108/56     Resting Oxygen Saturation  98 %     Exercise Oxygen Saturation  during 6 min walk 98 %     Max Ex. HR 87 bpm     Max Ex. BP 122/54     2 Minute Post BP 118/60              Psychological, QOL, Others - Outcomes: PHQ 2/9: Depression screen [facility] 2/9 09/21/2020 06/30/2020 04/15/2020 01/23/2019 11/24/2018  Decreased Interest 0 0 0 0 0  Down, Depressed, Hopeless 0 0 0 0 0  PHQ - 2 Score 0 0 0 0 0  Altered sleeping - 0 - - -  Tired, decreased energy - 1 - - -  Change in appetite - 0 - - -  Feeling bad or failure about yourself  - 0 - - -  Trouble concentrating - 1 - - -  Moving slowly or fidgety/restless - 1 - - -  Suicidal thoughts - 0 - - -  PHQ-9 Score - 3 - - -  Difficult doing work/chores - Somewhat difficult - - -  Some recent data might be hidden    Quality of Life:  Quality of Life - 07/04/20 1448       Quality of Life   Select Quality of Life      Quality of Life Scores   Health/Function Pre 16.9 %    Socioeconomic Pre 27.58 %    Psych/Spiritual Pre 26.5 %    Family Pre 13.5 %    GLOBAL Pre 20.58 %              Personal Goals: Goals established at orientation with interventions provided to work toward goal.  Personal Goals and Risk Factors at Admission - 06/30/20 1250       Core Components/Risk Factors/Patient Goals on Admission   Improve shortness of breath with ADL's Yes    Intervention Provide education, individualized exercise plan and daily activity instruction to help decrease symptoms of SOB with activities of daily living.    Expected Outcomes Short Term: Improve cardiorespiratory fitness to achieve a reduction of symptoms when performing ADLs;Long Term: Be able to perform more ADLs without symptoms or delay the onset of symptoms    Diabetes Yes    Intervention Provide education about signs/symptoms and action to take for hypo/hyperglycemia.;Provide education about proper nutrition, including hydration, and aerobic/resistive exercise prescription along with prescribed medications to achieve blood glucose in normal ranges: Fasting glucose 65-99 mg/dL    Expected Outcomes Short Term: Participant verbalizes understanding of the signs/symptoms and immediate care of hyper/hypoglycemia, proper foot care and importance of medication, aerobic/resistive exercise and nutrition plan for blood glucose control.;Long Term: Attainment of HbA1C < 7%.    Heart Failure Yes    Intervention Provide a combined exercise and nutrition program that is supplemented with education, support and counseling about heart failure. Directed toward relieving symptoms such as shortness of breath, decreased exercise tolerance, and extremity edema.    Expected Outcomes Improve functional capacity of life;Short term: Attendance in program 2-3 days a week with increased exercise capacity. Reported lower sodium intake. Reported increased fruit and vegetable intake. Reports medication compliance.;Short term: Daily weights obtained and reported for increase. Utilizing diuretic protocols set by physician.;Long term:  Adoption of self-care  skills and reduction of barriers for early signs and symptoms recognition and intervention leading to self-care maintenance.              Personal Goals Discharge:  Goals and Risk Factor Review     Row Name 07/20/20 0955 08/12/20 1309 09/05/20 1427         Core Components/Risk Factors/Patient Goals Review   Personal Goals Review Weight Management/Obesity;Diabetes Weight Management/Obesity;Diabetes Weight Management/Obesity;Diabetes     Review Patient is new to the program completing 6 sessions. He was referred to [facility] with TAVR. He has multiple risk factors for CAD and is participating in the program for risk modification. His last A1C on file was 05/03/20 at 5.7%. His blood pressure is well controlled. His personal goals for the program are to get stronger. We will continue to monitor his progress as he works toward meeting these goals. Patient has completed 9 sessions. He has missed several sessions due to being in volume overload. Dr. Haroldine Laws did not want him to return to CR until he got him back to euvolemic. He returned  08/05/20 and is doing well in the program. He says he still feels weak at times but feels better than he did. His personal goal for the program is to get stronger. We will continue to monitor him as he works toward meeting this goal. Patient has completed 13 sessions. His attendance remains inconsistent due to MD appointments and balance issues. He saw his endrocrinologist 08/25/20. His A1C was 9.9% up from 5.7% a few months ago. He added back [place] 20 U qhs. His blood pressures have been well controlled. His personal goal for the program is to get stronger. We will continue to monitor his progress as he works towards meeting this goals.     Expected Outcomes Patient will complete the program meeting both personal and program goals. Patient will complete the program meeting both personal and program goals. Patient will complete the program meeting both personal and program goals.               Exercise Goals and Review:  Exercise Goals     Row Name 07/04/20 1448 07/25/20 1507 08/23/20 1153 09/12/20 1552       Exercise Goals   Increase Physical Activity Yes Yes Yes Yes    Intervention Develop an individualized exercise prescription for aerobic and resistive training based on initial evaluation findings, risk stratification, comorbidities and participant's personal goals.;Provide advice, education, support and counseling about physical activity/exercise needs. Develop an individualized exercise prescription for aerobic and resistive training based on initial evaluation findings, risk stratification, comorbidities and participant's personal goals.;Provide advice, education, support and counseling about physical activity/exercise needs. Develop an individualized exercise prescription for aerobic and resistive training based on initial evaluation findings, risk stratification, comorbidities and participant's personal goals.;Provide advice, education, support and counseling about physical activity/exercise needs. Develop an individualized exercise prescription for aerobic and resistive training based on initial evaluation findings, risk stratification, comorbidities and participant's personal goals.;Provide advice, education, support and counseling about physical activity/exercise needs.    Expected Outcomes Short Term: Attend rehab on a regular basis to increase amount of physical activity.;Long Term: Add in home exercise to make exercise part of routine and to increase amount of physical activity.;Long Term: Exercising regularly at least 3-5 days a week. Short Term: Attend rehab on a regular basis to increase amount of physical activity.;Long Term: Add in home exercise to make exercise part of routine and to increase amount  of physical activity.;Long Term: Exercising regularly at least 3-5 days a week. Short Term: Attend rehab on a regular basis to increase amount of physical  activity.;Long Term: Add in home exercise to make exercise part of routine and to increase amount of physical activity.;Long Term: Exercising regularly at least 3-5 days a week. Short Term: Attend rehab on a regular basis to increase amount of physical activity.;Long Term: Add in home exercise to make exercise part of routine and to increase amount of physical activity.;Long Term: Exercising regularly at least 3-5 days a week.    Increase Strength and Stamina Yes Yes Yes Yes    Intervention Provide advice, education, support and counseling about physical activity/exercise needs.;Develop an individualized exercise prescription for aerobic and resistive training based on initial evaluation findings, risk stratification, comorbidities and participant's personal goals. Provide advice, education, support and counseling about physical activity/exercise needs.;Develop an individualized exercise prescription for aerobic and resistive training based on initial evaluation findings, risk stratification, comorbidities and participant's personal goals. Provide advice, education, support and counseling about physical activity/exercise needs.;Develop an individualized exercise prescription for aerobic and resistive training based on initial evaluation findings, risk stratification, comorbidities and participant's personal goals. Provide advice, education, support and counseling about physical activity/exercise needs.;Develop an individualized exercise prescription for aerobic and resistive training based on initial evaluation findings, risk stratification, comorbidities and participant's personal goals.    Expected Outcomes Short Term: Increase workloads from initial exercise prescription for resistance, speed, and METs.;Short Term: Perform resistance training exercises routinely during rehab and add in resistance training at home;Long Term: Improve cardiorespiratory fitness, muscular endurance and strength as measured by  increased METs and functional capacity (6MWT) Short Term: Increase workloads from initial exercise prescription for resistance, speed, and METs.;Short Term: Perform resistance training exercises routinely during rehab and add in resistance training at home;Long Term: Improve cardiorespiratory fitness, muscular endurance and strength as measured by increased METs and functional capacity (6MWT) Short Term: Increase workloads from initial exercise prescription for resistance, speed, and METs.;Short Term: Perform resistance training exercises routinely during rehab and add in resistance training at home;Long Term: Improve cardiorespiratory fitness, muscular endurance and strength as measured by increased METs and functional capacity (6MWT) Short Term: Increase workloads from initial exercise prescription for resistance, speed, and METs.;Short Term: Perform resistance training exercises routinely during rehab and add in resistance training at home;Long Term: Improve cardiorespiratory fitness, muscular endurance and strength as measured by increased METs and functional capacity (6MWT)    Able to understand and use rate of perceived exertion (RPE) scale Yes Yes Yes Yes    Intervention Provide education and explanation on how to use RPE scale Provide education and explanation on how to use RPE scale Provide education and explanation on how to use RPE scale Provide education and explanation on how to use RPE scale    Expected Outcomes Short Term: Able to use RPE daily in rehab to express subjective intensity level;Long Term:  Able to use RPE to guide intensity level when exercising independently Short Term: Able to use RPE daily in rehab to express subjective intensity level;Long Term:  Able to use RPE to guide intensity level when exercising independently Short Term: Able to use RPE daily in rehab to express subjective intensity level;Long Term:  Able to use RPE to guide intensity level when exercising independently  Short Term: Able to use RPE daily in rehab to express subjective intensity level;Long Term:  Able to use RPE to guide intensity level when exercising independently    Knowledge  and understanding of Target Heart Rate Range (THRR) Yes Yes Yes Yes    Intervention Provide education and explanation of THRR including how the numbers were predicted and where they are located for reference Provide education and explanation of THRR including how the numbers were predicted and where they are located for reference Provide education and explanation of THRR including how the numbers were predicted and where they are located for reference Provide education and explanation of THRR including how the numbers were predicted and where they are located for reference    Expected Outcomes Short Term: Able to state/look up THRR;Long Term: Able to use THRR to govern intensity when exercising independently;Short Term: Able to use daily as guideline for intensity in rehab Short Term: Able to state/look up THRR;Long Term: Able to use THRR to govern intensity when exercising independently;Short Term: Able to use daily as guideline for intensity in rehab Short Term: Able to state/look up THRR;Long Term: Able to use THRR to govern intensity when exercising independently;Short Term: Able to use daily as guideline for intensity in rehab Short Term: Able to state/look up THRR;Long Term: Able to use THRR to govern intensity when exercising independently;Short Term: Able to use daily as guideline for intensity in rehab    Able to check pulse independently Yes Yes Yes Yes    Intervention Provide education and demonstration on how to check pulse in carotid and radial arteries.;Review the importance of being able to check your own pulse for safety during independent exercise Provide education and demonstration on how to check pulse in carotid and radial arteries.;Review the importance of being able to check your own pulse for safety during  independent exercise Provide education and demonstration on how to check pulse in carotid and radial arteries.;Review the importance of being able to check your own pulse for safety during independent exercise Provide education and demonstration on how to check pulse in carotid and radial arteries.;Review the importance of being able to check your own pulse for safety during independent exercise    Expected Outcomes Short Term: Able to explain why pulse checking is important during independent exercise;Long Term: Able to check pulse independently and accurately Short Term: Able to explain why pulse checking is important during independent exercise;Long Term: Able to check pulse independently and accurately Short Term: Able to explain why pulse checking is important during independent exercise;Long Term: Able to check pulse independently and accurately Short Term: Able to explain why pulse checking is important during independent exercise;Long Term: Able to check pulse independently and accurately    Understanding of Exercise Prescription Yes Yes Yes Yes    Intervention Provide education, explanation, and written materials on patient's individual exercise prescription Provide education, explanation, and written materials on patient's individual exercise prescription Provide education, explanation, and written materials on patient's individual exercise prescription Provide education, explanation, and written materials on patient's individual exercise prescription    Expected Outcomes Long Term: Able to explain home exercise prescription to exercise independently;Short Term: Able to explain program exercise prescription Long Term: Able to explain home exercise prescription to exercise independently;Short Term: Able to explain program exercise prescription Long Term: Able to explain home exercise prescription to exercise independently;Short Term: Able to explain program exercise prescription Long Term: Able to  explain home exercise prescription to exercise independently;Short Term: Able to explain program exercise prescription             Exercise Goals Re-Evaluation:  Exercise Goals Re-Evaluation     Row Name 07/15/20 1507 08/23/20  1153 09/12/20 1552         Exercise Goal Re-Evaluation   Exercise Goals Review Increase Strength and Stamina;Increase Physical Activity;Able to understand and use rate of perceived exertion (RPE) scale;Knowledge and understanding of Target Heart Rate Range (THRR);Able to check pulse independently;Understanding of Exercise Prescription Increase Strength and Stamina;Increase Physical Activity;Able to understand and use rate of perceived exertion (RPE) scale;Knowledge and understanding of Target Heart Rate Range (THRR);Able to check pulse independently;Understanding of Exercise Prescription Increase Physical Activity;Increase Strength and Stamina;Able to understand and use rate of perceived exertion (RPE) scale;Knowledge and understanding of Target Heart Rate Range (THRR);Able to check pulse independently;Understanding of Exercise Prescription     Comments Patient has completed 5 exercise sessions. He has been tolerating exercise well. He has been progressing slowly. He has had inconsistent attendance. He is currently exercising at 1.9 METs on the NuStep. He is very positive in class and has a bright outlook on his goals. Will continue to monitor and progress as able. Patient has completed 10 exercise sessions. He has tolerated exercise well with little progressions. He has some trouble with his shoulders during the resistance training sessions. I give him alternative exercises to complete for ones that bother his shoulder. His balance is also unstable as well as his lower body strength. Modifications are made to exercise prescription to accomodate. He is currently exercising at 1.9 METs on the NuStep. Will continue to monitor and progress as able. Patient has completed 13  sessions of cardiac rehab. He has mad little to no progress due to his infrequent attendance. He has now missed 5 sessions in a row and frequently misses whole weeks at a time. He will graduate in a couple of weeks. When he attends, he exercises at 1.93 METs on the stepper. Will continue to monitor and progress as able.     Expected Outcomes Throughout exercise at rehab and at home patient will achieve their goals. Throughout exercise at rehab and at home patient will achieve their goals. Throughout exercise at rehab and at home patient will achieve their goals.              Nutrition & Weight - Outcomes:  Pre Biometrics - 08/08/20 1552       Pre Biometrics   Weight 87.6 kg              Nutrition:  Nutrition Therapy & Goals - 07/20/20 0953       Personal Nutrition Goals   Comments Patient scored 22 on his diet assessment. We provide 2 educational sessions with handout on heart healthy nutrition and offer RD referral if patient is interested.      Intervention Plan   Intervention Nutrition handout(s) given to patient.             Nutrition Discharge:  Nutrition Assessments - 06/30/20 1243       MEDFICTS Scores   Pre Score 23             Education Questionnaire Score:  Knowledge Questionnaire Score - 06/30/20 1247       Knowledge Questionnaire Score   Pre Score 15/24             Patient was discharged from cardiac rehab on 09/23/2020 after 13 sessions. He last attended rehab on 08/26/2020. His attendance was inconsistent and missed weeks at a time. He had missed the final three weeks of the program without calling. He did not progress in the program. His Met levels stayed constant in  the mid 1's. His balance was poor, and he did not have much stamina. He has had several appointments with other providers including cardiology, endocrinology, gastroenterology, orthopedic surgery, and family medicine. It is unlikely that he will exercise on his own.

## 2020-09-27 NOTE — Telephone Encounter (Signed)
New message      *STAT* If patient is at the pharmacy, call can be transferred to refill team.   1. Which medications need to be refilled? (please list name of each medication and dose if known) benzonatate (TESSALON) 100 MG capsule  2. Which pharmacy/location (including street and city if local pharmacy) is medication to be sent to? [facility] [address]  3. Do they need a 30 day or 90 day supply? [place]

## 2020-09-27 NOTE — Telephone Encounter (Signed)
Reviewed med with Dr. Lovena Le and confirmed that it is ok to refill.

## 2020-09-27 NOTE — Addendum Note (Signed)
Encounter addended by: Philis Kendall on: 09/27/2020 1:04 PM  Actions taken: Episode resolved, Flowsheet accepted, Clinical Note Signed

## 2020-10-03 ENCOUNTER — Other Ambulatory Visit (HOSPITAL_COMMUNITY): Payer: Self-pay | Admitting: Family Medicine

## 2020-10-05 ENCOUNTER — Other Ambulatory Visit: Payer: Self-pay

## 2020-10-05 MED ORDER — TRULICITY 1.5 MG/0.5ML ~~LOC~~ SOAJ: SUBCUTANEOUS | 1 refills | Status: DC

## 2020-10-13 ENCOUNTER — Ambulatory Visit (INDEPENDENT_AMBULATORY_CARE_PROVIDER_SITE_OTHER): Payer: Medicare HMO

## 2020-10-13 DIAGNOSIS — I442 Atrioventricular block, complete: Secondary | ICD-10-CM | POA: Diagnosis not present

## 2020-10-13 LAB — CUP PACEART REMOTE DEVICE CHECK
Battery Impedance: 402 Ohm
Battery Remaining Longevity: 83 mo
Battery Voltage: 2.78 V
Brady Statistic AP VP Percent: 3 %
Brady Statistic AP VS Percent: 0 %
Brady Statistic AS VP Percent: 6 %
Brady Statistic AS VS Percent: 90 %
Date Time Interrogation Session: 20220721091620
Implantable Lead Implant Date: 20170605
Implantable Lead Implant Date: 20170605
Implantable Lead Location: 753859
Implantable Lead Location: 753860
Implantable Lead Model: 5076
Implantable Lead Model: 5076
Implantable Pulse Generator Implant Date: 20170605
Lead Channel Impedance Value: 371 Ohm
Lead Channel Impedance Value: 466 Ohm
Lead Channel Pacing Threshold Amplitude: 0.75 V
Lead Channel Pacing Threshold Amplitude: 0.875 V
Lead Channel Pacing Threshold Pulse Width: 0.4 ms
Lead Channel Pacing Threshold Pulse Width: 0.4 ms
Lead Channel Setting Pacing Amplitude: 2 V
Lead Channel Setting Pacing Amplitude: 2.5 V
Lead Channel Setting Pacing Pulse Width: 0.4 ms
Lead Channel Setting Sensing Sensitivity: 4 mV

## 2020-10-17 ENCOUNTER — Inpatient Hospital Stay (HOSPITAL_COMMUNITY)
Admission: EM | Admit: 2020-10-17 | Discharge: 2020-10-22 | DRG: 357 | Disposition: A | Discharge status: Home Health | Payer: Medicare HMO | Attending: Family Medicine | Admitting: Family Medicine

## 2020-10-17 ENCOUNTER — Emergency Department (HOSPITAL_COMMUNITY): Payer: Medicare HMO

## 2020-10-17 ENCOUNTER — Other Ambulatory Visit: Payer: Self-pay

## 2020-10-17 ENCOUNTER — Encounter (HOSPITAL_COMMUNITY): Payer: Self-pay | Admitting: Emergency Medicine

## 2020-10-17 DIAGNOSIS — E871 Hypo-osmolality and hyponatremia: Secondary | ICD-10-CM | POA: Diagnosis present

## 2020-10-17 DIAGNOSIS — I472 Ventricular tachycardia: Secondary | ICD-10-CM | POA: Diagnosis present

## 2020-10-17 DIAGNOSIS — R739 Hyperglycemia, unspecified: Secondary | ICD-10-CM

## 2020-10-17 DIAGNOSIS — D631 Anemia in chronic kidney disease: Secondary | ICD-10-CM | POA: Diagnosis present

## 2020-10-17 DIAGNOSIS — E1165 Type 2 diabetes mellitus with hyperglycemia: Secondary | ICD-10-CM | POA: Diagnosis present

## 2020-10-17 DIAGNOSIS — K921 Melena: Secondary | ICD-10-CM

## 2020-10-17 DIAGNOSIS — I5022 Chronic systolic (congestive) heart failure: Secondary | ICD-10-CM | POA: Diagnosis not present

## 2020-10-17 DIAGNOSIS — K922 Gastrointestinal hemorrhage, unspecified: Secondary | ICD-10-CM | POA: Diagnosis present

## 2020-10-17 DIAGNOSIS — I4892 Unspecified atrial flutter: Secondary | ICD-10-CM | POA: Diagnosis not present

## 2020-10-17 DIAGNOSIS — R531 Weakness: Secondary | ICD-10-CM

## 2020-10-17 DIAGNOSIS — D649 Anemia, unspecified: Secondary | ICD-10-CM | POA: Diagnosis not present

## 2020-10-17 DIAGNOSIS — I130 Hypertensive heart and chronic kidney disease with heart failure and stage 1 through stage 4 chronic kidney disease, or unspecified chronic kidney disease: Secondary | ICD-10-CM | POA: Diagnosis present

## 2020-10-17 DIAGNOSIS — I420 Dilated cardiomyopathy: Secondary | ICD-10-CM | POA: Diagnosis present

## 2020-10-17 DIAGNOSIS — K7581 Nonalcoholic steatohepatitis (NASH): Secondary | ICD-10-CM | POA: Diagnosis present

## 2020-10-17 DIAGNOSIS — E1122 Type 2 diabetes mellitus with diabetic chronic kidney disease: Secondary | ICD-10-CM | POA: Diagnosis present

## 2020-10-17 DIAGNOSIS — Z794 Long term (current) use of insulin: Secondary | ICD-10-CM

## 2020-10-17 DIAGNOSIS — I080 Rheumatic disorders of both mitral and aortic valves: Secondary | ICD-10-CM | POA: Diagnosis present

## 2020-10-17 DIAGNOSIS — E860 Dehydration: Secondary | ICD-10-CM | POA: Diagnosis not present

## 2020-10-17 DIAGNOSIS — R0689 Other abnormalities of breathing: Secondary | ICD-10-CM | POA: Diagnosis not present

## 2020-10-17 DIAGNOSIS — I10 Essential (primary) hypertension: Secondary | ICD-10-CM | POA: Diagnosis not present

## 2020-10-17 DIAGNOSIS — I951 Orthostatic hypotension: Secondary | ICD-10-CM | POA: Diagnosis present

## 2020-10-17 DIAGNOSIS — I959 Hypotension, unspecified: Secondary | ICD-10-CM | POA: Diagnosis not present

## 2020-10-17 DIAGNOSIS — M109 Gout, unspecified: Secondary | ICD-10-CM | POA: Diagnosis present

## 2020-10-17 DIAGNOSIS — I480 Paroxysmal atrial fibrillation: Secondary | ICD-10-CM | POA: Diagnosis not present

## 2020-10-17 DIAGNOSIS — Z952 Presence of prosthetic heart valve: Secondary | ICD-10-CM | POA: Diagnosis not present

## 2020-10-17 DIAGNOSIS — Z8249 Family history of ischemic heart disease and other diseases of the circulatory system: Secondary | ICD-10-CM

## 2020-10-17 DIAGNOSIS — Z79899 Other long term (current) drug therapy: Secondary | ICD-10-CM

## 2020-10-17 DIAGNOSIS — Z7901 Long term (current) use of anticoagulants: Secondary | ICD-10-CM | POA: Diagnosis not present

## 2020-10-17 DIAGNOSIS — E1159 Type 2 diabetes mellitus with other circulatory complications: Secondary | ICD-10-CM | POA: Diagnosis present

## 2020-10-17 DIAGNOSIS — E782 Mixed hyperlipidemia: Secondary | ICD-10-CM | POA: Diagnosis not present

## 2020-10-17 DIAGNOSIS — N183 Chronic kidney disease, stage 3 unspecified: Secondary | ICD-10-CM | POA: Diagnosis present

## 2020-10-17 DIAGNOSIS — D62 Acute posthemorrhagic anemia: Secondary | ICD-10-CM | POA: Diagnosis present

## 2020-10-17 DIAGNOSIS — Z9889 Other specified postprocedural states: Secondary | ICD-10-CM | POA: Diagnosis not present

## 2020-10-17 DIAGNOSIS — I110 Hypertensive heart disease with heart failure: Secondary | ICD-10-CM | POA: Diagnosis not present

## 2020-10-17 DIAGNOSIS — Z950 Presence of cardiac pacemaker: Secondary | ICD-10-CM

## 2020-10-17 DIAGNOSIS — K7460 Unspecified cirrhosis of liver: Secondary | ICD-10-CM | POA: Diagnosis present

## 2020-10-17 DIAGNOSIS — K269 Duodenal ulcer, unspecified as acute or chronic, without hemorrhage or perforation: Secondary | ICD-10-CM | POA: Diagnosis not present

## 2020-10-17 DIAGNOSIS — K264 Chronic or unspecified duodenal ulcer with hemorrhage: Secondary | ICD-10-CM | POA: Diagnosis not present

## 2020-10-17 DIAGNOSIS — N1832 Chronic kidney disease, stage 3b: Secondary | ICD-10-CM | POA: Diagnosis not present

## 2020-10-17 DIAGNOSIS — I4821 Permanent atrial fibrillation: Secondary | ICD-10-CM | POA: Diagnosis present

## 2020-10-17 DIAGNOSIS — K3189 Other diseases of stomach and duodenum: Secondary | ICD-10-CM | POA: Diagnosis not present

## 2020-10-17 DIAGNOSIS — Z7982 Long term (current) use of aspirin: Secondary | ICD-10-CM

## 2020-10-17 DIAGNOSIS — E669 Obesity, unspecified: Secondary | ICD-10-CM | POA: Diagnosis present

## 2020-10-17 DIAGNOSIS — E861 Hypovolemia: Secondary | ICD-10-CM | POA: Diagnosis present

## 2020-10-17 DIAGNOSIS — T45515A Adverse effect of anticoagulants, initial encounter: Secondary | ICD-10-CM | POA: Diagnosis present

## 2020-10-17 DIAGNOSIS — I5042 Chronic combined systolic (congestive) and diastolic (congestive) heart failure: Secondary | ICD-10-CM | POA: Diagnosis present

## 2020-10-17 DIAGNOSIS — Z20822 Contact with and (suspected) exposure to covid-19: Secondary | ICD-10-CM | POA: Diagnosis not present

## 2020-10-17 DIAGNOSIS — D696 Thrombocytopenia, unspecified: Secondary | ICD-10-CM | POA: Diagnosis not present

## 2020-10-17 HISTORY — DX: Nonrheumatic mitral (valve) insufficiency: I34.0

## 2020-10-17 HISTORY — DX: Other cardiomyopathies: I42.8

## 2020-10-17 HISTORY — DX: Nonalcoholic steatohepatitis (NASH): K75.81

## 2020-10-17 HISTORY — DX: Chronic systolic (congestive) heart failure: I50.22

## 2020-10-17 HISTORY — DX: Chronic kidney disease, stage 3b: N18.32

## 2020-10-17 LAB — CBC WITH DIFFERENTIAL/PLATELET
Abs Immature Granulocytes: 0.1 10*3/uL — ABNORMAL HIGH (ref 0.00–0.07)
Basophils Absolute: 0.1 10*3/uL (ref 0.0–0.1)
Basophils Relative: 1 %
Eosinophils Absolute: 0.2 10*3/uL (ref 0.0–0.5)
Eosinophils Relative: 2 %
HCT: 21.6 % — ABNORMAL LOW (ref 39.0–52.0)
Hemoglobin: 7.3 g/dL — ABNORMAL LOW (ref 13.0–17.0)
Immature Granulocytes: 1 %
Lymphocytes Relative: 12 %
Lymphs Abs: 1.2 10*3/uL (ref 0.7–4.0)
MCH: 33.2 pg (ref 26.0–34.0)
MCHC: 33.8 g/dL (ref 30.0–36.0)
MCV: 98.2 fL (ref 80.0–100.0)
Monocytes Absolute: 1 10*3/uL (ref 0.1–1.0)
Monocytes Relative: 10 %
Neutro Abs: 7.3 10*3/uL (ref 1.7–7.7)
Neutrophils Relative %: 74 %
Platelets: 122 10*3/uL — ABNORMAL LOW (ref 150–400)
RBC: 2.2 MIL/uL — ABNORMAL LOW (ref 4.22–5.81)
RDW: 17 % — ABNORMAL HIGH (ref 11.5–15.5)
WBC: 9.9 10*3/uL (ref 4.0–10.5)
nRBC: 0 % (ref 0.0–0.2)

## 2020-10-17 LAB — TROPONIN I (HIGH SENSITIVITY)
Troponin I (High Sensitivity): 58 ng/L — ABNORMAL HIGH
Troponin I (High Sensitivity): 57 ng/L — ABNORMAL HIGH (ref <18)
Troponin I (High Sensitivity): 62 ng/L — ABNORMAL HIGH (ref <18)
Troponin I (High Sensitivity): 61 ng/L — ABNORMAL HIGH (ref <18)

## 2020-10-17 LAB — BRAIN NATRIURETIC PEPTIDE
B Natriuretic Peptide: 851 pg/mL — ABNORMAL HIGH (ref 0.0–100.0)
B Natriuretic Peptide: 753 pg/mL — ABNORMAL HIGH (ref 0.0–100.0)

## 2020-10-17 LAB — GLUCOSE, CAPILLARY
Glucose-Capillary: 336 mg/dL — ABNORMAL HIGH (ref 70–99)
Glucose-Capillary: 265 mg/dL — ABNORMAL HIGH (ref 70–99)
Glucose-Capillary: 143 mg/dL — ABNORMAL HIGH (ref 70–99)

## 2020-10-17 LAB — COMPREHENSIVE METABOLIC PANEL
ALT: 20 U/L (ref 0–44)
AST: 34 U/L (ref 15–41)
Albumin: 2.7 g/dL — ABNORMAL LOW (ref 3.5–5.0)
Alkaline Phosphatase: 146 U/L — ABNORMAL HIGH (ref 38–126)
Anion gap: 9 (ref 5–15)
BUN: 104 mg/dL — ABNORMAL HIGH (ref 8–23)
CO2: 25 mmol/L (ref 22–32)
Calcium: 8.2 mg/dL — ABNORMAL LOW (ref 8.9–10.3)
Chloride: 92 mmol/L — ABNORMAL LOW (ref 98–111)
Creatinine, Ser: 1.5 mg/dL — ABNORMAL HIGH (ref 0.61–1.24)
GFR, Estimated: 49 mL/min — ABNORMAL LOW (ref >60)
Glucose, Bld: 470 mg/dL — ABNORMAL HIGH (ref 70–99)
Potassium: 3.9 mmol/L (ref 3.5–5.1)
Sodium: 126 mmol/L — ABNORMAL LOW (ref 135–145)
Total Bilirubin: 1.4 mg/dL — ABNORMAL HIGH (ref 0.3–1.2)
Total Protein: 5.8 g/dL — ABNORMAL LOW (ref 6.5–8.1)

## 2020-10-17 LAB — PROTIME-INR
INR: 1.5 — ABNORMAL HIGH (ref 0.8–1.2)
Prothrombin Time: 17.8 seconds — ABNORMAL HIGH (ref 11.4–15.2)

## 2020-10-17 LAB — RESP PANEL BY RT-PCR (FLU A&B, COVID) ARPGX2
Influenza A by PCR: NEGATIVE
Influenza B by PCR: NEGATIVE
SARS Coronavirus 2 by RT PCR: NEGATIVE

## 2020-10-17 LAB — PREPARE RBC (CROSSMATCH)

## 2020-10-17 LAB — CBG MONITORING, ED: Glucose-Capillary: 428 mg/dL — ABNORMAL HIGH (ref 70–99)

## 2020-10-17 LAB — POC OCCULT BLOOD, ED: Fecal Occult Bld: POSITIVE — AB

## 2020-10-17 MED ORDER — LEVALBUTEROL HCL 0.63 MG/3ML IN NEBU: 0.6300 mg | INHALATION_SOLUTION | Freq: Four times a day (QID) | RESPIRATORY_TRACT | Status: DC | PRN

## 2020-10-17 MED ORDER — TRAZODONE HCL 50 MG PO TABS: 25.0000 mg | ORAL_TABLET | Freq: Every evening | ORAL | Status: DC | PRN

## 2020-10-17 MED ORDER — ONDANSETRON HCL 4 MG PO TABS: 4.0000 mg | ORAL_TABLET | Freq: Four times a day (QID) | ORAL | Status: DC | PRN

## 2020-10-17 MED ORDER — SODIUM CHLORIDE 0.9 % IV SOLN
2.0000 g | INTRAVENOUS | Status: DC
  Administered 2020-10-17 – 2020-10-21 (×4): 2 g via INTRAVENOUS
  Filled 2020-10-17 (×4): qty 20

## 2020-10-17 MED ORDER — INSULIN DEGLUDEC 100 UNIT/ML ~~LOC~~ SOPN: 20.0000 [IU] | PEN_INJECTOR | Freq: Every day | SUBCUTANEOUS | Status: DC

## 2020-10-17 MED ORDER — CARVEDILOL 3.125 MG PO TABS
3.1250 mg | ORAL_TABLET | Freq: Every day | ORAL | Status: DC
  Administered 2020-10-19 – 2020-10-22 (×4): 3.125 mg via ORAL
  Filled 2020-10-17 (×5): qty 1

## 2020-10-17 MED ORDER — CARVEDILOL 3.125 MG PO TABS
6.2500 mg | ORAL_TABLET | Freq: Every day | ORAL | Status: DC
  Administered 2020-10-19 – 2020-10-21 (×3): 6.25 mg via ORAL
  Filled 2020-10-17 (×3): qty 2

## 2020-10-17 MED ORDER — SPIRONOLACTONE 12.5 MG HALF TABLET
12.5000 mg | ORAL_TABLET | Freq: Every day | ORAL | Status: DC
  Administered 2020-10-20 – 2020-10-22 (×3): 12.5 mg via ORAL
  Filled 2020-10-17 (×6): qty 1

## 2020-10-17 MED ORDER — ONDANSETRON HCL 4 MG/2ML IJ SOLN: 4.0000 mg | Freq: Four times a day (QID) | INTRAMUSCULAR | Status: DC | PRN

## 2020-10-17 MED ORDER — SODIUM CHLORIDE 0.9% FLUSH
3.0000 mL | Freq: Two times a day (BID) | INTRAVENOUS | Status: DC
  Administered 2020-10-17 – 2020-10-22 (×8): 3 mL via INTRAVENOUS

## 2020-10-17 MED ORDER — TORSEMIDE 20 MG PO TABS
60.0000 mg | ORAL_TABLET | Freq: Two times a day (BID) | ORAL | Status: DC
  Administered 2020-10-19 – 2020-10-22 (×6): 60 mg via ORAL
  Filled 2020-10-17 (×6): qty 3

## 2020-10-17 MED ORDER — SODIUM CHLORIDE 0.9% FLUSH
3.0000 mL | Freq: Two times a day (BID) | INTRAVENOUS | Status: DC
  Administered 2020-10-17 – 2020-10-22 (×8): 3 mL via INTRAVENOUS

## 2020-10-17 MED ORDER — HYDRALAZINE HCL 20 MG/ML IJ SOLN: 10.0000 mg | INTRAMUSCULAR | Status: DC | PRN

## 2020-10-17 MED ORDER — ACETAMINOPHEN 650 MG RE SUPP: 650.0000 mg | Freq: Four times a day (QID) | RECTAL | Status: DC | PRN

## 2020-10-17 MED ORDER — SODIUM CHLORIDE 0.9 % IV SOLN: 250.0000 mL | INTRAVENOUS | Status: DC | PRN

## 2020-10-17 MED ORDER — HYDROMORPHONE HCL 1 MG/ML IJ SOLN
0.5000 mg | INTRAMUSCULAR | Status: DC | PRN
  Administered 2020-10-18 – 2020-10-19 (×2): 0.5 mg via INTRAVENOUS
  Administered 2020-10-20 – 2020-10-21 (×5): 1 mg via INTRAVENOUS
  Filled 2020-10-17 (×2): qty 1
  Filled 2020-10-17: qty 0.5
  Filled 2020-10-17 (×4): qty 1

## 2020-10-17 MED ORDER — IPRATROPIUM BROMIDE 0.02 % IN SOLN: 0.5000 mg | Freq: Four times a day (QID) | RESPIRATORY_TRACT | Status: DC | PRN

## 2020-10-17 MED ORDER — SODIUM CHLORIDE 0.9 % IV SOLN
50.0000 ug/h | INTRAVENOUS | Status: DC
  Administered 2020-10-17 – 2020-10-18 (×2): 50 ug/h via INTRAVENOUS
  Filled 2020-10-17 (×8): qty 1

## 2020-10-17 MED ORDER — DULAGLUTIDE 1.5 MG/0.5ML ~~LOC~~ SOAJ: 1.5000 mg | SUBCUTANEOUS | Status: DC

## 2020-10-17 MED ORDER — ASPIRIN 81 MG PO CHEW
81.0000 mg | CHEWABLE_TABLET | Freq: Every day | ORAL | Status: DC
  Filled 2020-10-17: qty 1

## 2020-10-17 MED ORDER — METOLAZONE 5 MG PO TABS: 2.5000 mg | ORAL_TABLET | Freq: Every day | ORAL | Status: DC | PRN

## 2020-10-17 MED ORDER — OXYCODONE HCL 5 MG PO TABS: 5.0000 mg | ORAL_TABLET | ORAL | Status: DC | PRN

## 2020-10-17 MED ORDER — SENNOSIDES-DOCUSATE SODIUM 8.6-50 MG PO TABS
1.0000 | ORAL_TABLET | Freq: Every evening | ORAL | Status: DC | PRN
  Administered 2020-10-21: 1 via ORAL
  Filled 2020-10-17: qty 1

## 2020-10-17 MED ORDER — SODIUM CHLORIDE 0.9 % IV SOLN: INTRAVENOUS | Status: AC

## 2020-10-17 MED ORDER — ACETAMINOPHEN 325 MG PO TABS: 650.0000 mg | ORAL_TABLET | Freq: Four times a day (QID) | ORAL | Status: DC | PRN

## 2020-10-17 MED ORDER — PANTOPRAZOLE SODIUM 40 MG IV SOLR
40.0000 mg | Freq: Two times a day (BID) | INTRAVENOUS | Status: DC
  Administered 2020-10-17 – 2020-10-18 (×3): 40 mg via INTRAVENOUS
  Filled 2020-10-17 (×2): qty 40

## 2020-10-17 MED ORDER — OCTREOTIDE LOAD VIA INFUSION
50.0000 ug | Freq: Once | INTRAVENOUS | Status: AC
  Administered 2020-10-17: 50 ug via INTRAVENOUS
  Filled 2020-10-17: qty 25

## 2020-10-17 MED ORDER — INSULIN ASPART 100 UNIT/ML IJ SOLN
10.0000 [IU] | Freq: Once | INTRAMUSCULAR | Status: DC
Start: 2020-10-17 — End: 2020-10-22

## 2020-10-17 MED ORDER — BISACODYL 5 MG PO TBEC
5.0000 mg | DELAYED_RELEASE_TABLET | Freq: Every day | ORAL | Status: DC | PRN
  Administered 2020-10-21: 5 mg via ORAL
  Filled 2020-10-17: qty 1

## 2020-10-17 MED ORDER — SODIUM CHLORIDE 0.9 % IV SOLN
10.0000 mL/h | Freq: Once | INTRAVENOUS | Status: AC
  Administered 2020-10-17: 10 mL/h via INTRAVENOUS

## 2020-10-17 MED ORDER — INSULIN ASPART 100 UNIT/ML IJ SOLN
0.0000 [IU] | Freq: Three times a day (TID) | INTRAMUSCULAR | Status: DC
  Administered 2020-10-17: 8 [IU] via SUBCUTANEOUS
  Administered 2020-10-18 – 2020-10-19 (×3): 3 [IU] via SUBCUTANEOUS
  Administered 2020-10-20: 8 [IU] via SUBCUTANEOUS
  Administered 2020-10-20: 3 [IU] via SUBCUTANEOUS
  Administered 2020-10-20 – 2020-10-21 (×2): 5 [IU] via SUBCUTANEOUS
  Administered 2020-10-21: 3 [IU] via SUBCUTANEOUS
  Administered 2020-10-21: 5 [IU] via SUBCUTANEOUS
  Administered 2020-10-22: 3 [IU] via SUBCUTANEOUS
  Administered 2020-10-22: 15 [IU] via SUBCUTANEOUS

## 2020-10-17 MED ORDER — SODIUM CHLORIDE 0.9% FLUSH: 3.0000 mL | INTRAVENOUS | Status: DC | PRN

## 2020-10-17 NOTE — Consult Note (Signed)
Referring Provider: Dr. Rogene Houston Primary Care Physician:  Erven Colla, DO Primary Gastroenterologist:  Susanne Greenhouse  Date of Admission: 10/17/20 Date of Consultation: 10/17/20  Reason for Consultation:  Anemia/GI Bleed  HPI:  John [name] is a 75 y.o. year old male with significant cardiac history to include atrial fibrillation, systolic heart failure, presence of pacemaker, aortic valve replacement, DM, HTN, hyperlipidemia and NASH.  ED Course:  Patient presented to the ED today for c/o weakness and history of fall 3 days ago w/o injury, currently anticoagulated on eliquis for extensive cardiac history (A flutter/aortic valve replacement). Reports he felt very weak over the past 2 days, he denied any black or bloody stools. BP reportedly hypotensive with EMS en route to ED which he received 500 CC bolus of NS for.  Hemoglobin found to be 7.3 initially, FOBT positive, with reported note of black tarry stools per ED MD's exam. Platelets 122, PT/INR 1.5. 2 units PRBCs ordered by ED physician.   Sodium 126, BUN 104, significantly elevated from 1 month ago (19). Creatnine appears relatively stable in relation to baseline at 1.5 today. AST and ALT WNL, Alk Phos 146, improved from 307 in June 2022. Total bilirubin also improved since June at 1.4 down from 2.4.  Troponin slightly elevated at 58, initially then 57 on repeat.   Consult: Patient states that he fell while attempting to use the restroom on Saturday, reportedly losing his balance and falling into the shower, he denied any fall related injuries, but states he began feeling generally weak after the fall. He denies any dizziness or lightheadedness. He denies nausea or vomiting.  Denies hematemesis and no history of blood transfusion in the past.  Does endorse history of constipation that he took some milk of magnesia for, last BM was yesterday, however, He is unsure if he has had any stools that black or bloody in nature as he does  not pay much attention to them.   He reports he has been on eliquis for some time for his cardiac issues, last dose was yesterday. Recent aortic valve replacement in feb 2022. He also has a pacemaker in place, previous attempt to place ICD was unsuccessful.   Recent outpatient GI workup revealed concerns NASH with possible cirrhosis. He denies any swelling of abdomen, however, does have chronic extremity edema r/t HF which he takes lasix for. He denies SOB, no supplemental oxygen at baseline, however, patient on 2L O2 in ED. No NSAID use reported.   Hypotensive upon exam at 115/44, however, patient reports that this is close to his baseline BP.   Past Medical History:  Diagnosis Date   Atrial flutter ([place]) 12/2010   Admitted with symptomatic bradycardia (HR 40s), atrial flutter with slow ventricular response 12/2010 + volume overload; AV nodal agents d/c'd and Pradaxa started; RFA in 01/2011   CHF (congestive heart failure) (HCC)    Chronic combined systolic and diastolic heart failure ([place])    a. echo 01/06/11: mild LVH, EF 65-70%, mod to severe LAE, mild RVE, mild RAE, PASP 32;   TEE 10/12: EF 45-50% b. EF 45 to 50% by echo in 07/2018   Class 2 severe obesity due to excess calories with serious comorbidity and body mass index (BMI) of 37.0 to 37.9 in adult ([place]) 07/17/2017   Diabetes mellitus    non insulin dependant   Hematoma, chest wall 05/15/2020   Hyperlipidemia    Hypertension    Osteoarthritis    Polyarticular gout 05/15/2020   Presence  of permanent cardiac pacemaker    PSVT (paroxysmal supraventricular tachycardia) (HCC)    Possibly atrial flutter   S/P TAVR (transcatheter aortic valve replacement) 05/03/2020   s/p TAVR with a 29 mm Edwards S3U via the subclavian approach with Dr. Angelena Form & Dr. Cyndia Bent    Severe aortic stenosis     Past Surgical History:  Procedure Laterality Date   ATRIAL FLUTTER ABLATION N/A 02/07/2011   Procedure: ATRIAL FLUTTER ABLATION;  Surgeon: Evans Lance, MD;  Location: [facility] CATH LAB;  Service: Cardiovascular;  Laterality: N/A;   BIV UPGRADE N/A 06/08/2020   Procedure: BIV PPM UPGRADE;  Surgeon: Evans Lance, MD;  Location: [place] CV LAB;  Service: Cardiovascular;  Laterality: N/A;   CARDIAC ELECTROPHYSIOLOGY STUDY AND ABLATION  02/07/11   CARDIOVERSION N/A 03/23/2016   Procedure: CARDIOVERSION;  Surgeon: Evans Lance, MD;  Location: [employer];  Service: Cardiovascular;  Laterality: N/A;   COLONOSCOPY N/A 04/17/2017   Procedure: COLONOSCOPY;  Surgeon: Rogene Houston, MD;  [facility];  Service: Endoscopy;  Laterality: N/A;  [place] N/A 08/29/2015   Procedure: Pacemaker Implant;  Surgeon: Evans Lance, MD;  Location: [place] CV LAB;  Service: Cardiovascular;  Laterality: N/A;   EP IMPLANTABLE DEVICE N/A 12/07/2015   Procedure: PPM Lead Revision/Repair;  Surgeon: Evans Lance, MD;  Location: [place] CV LAB;  Service: Cardiovascular;  Laterality: N/A;   KNEE ARTHROSCOPY  2005   left   LUMBAR SPINE SURGERY     "I've had 6 ORs 1972 thru 2004"   POLYPECTOMY  04/17/2017   Procedure: POLYPECTOMY;  Surgeon: Rogene Houston, MD;  [facility];  Service: Endoscopy;;  transverse colon x3;   RIGHT HEART CATH N/A 04/28/2020   Procedure: RIGHT HEART CATH;  Surgeon: Jolaine Artist, MD;  Location: [place] CV LAB;  Service: Cardiovascular;  Laterality: N/A;   RIGHT/LEFT HEART CATH AND CORONARY ANGIOGRAPHY N/A 12/21/2019   Procedure: RIGHT/LEFT HEART CATH AND CORONARY ANGIOGRAPHY;  Surgeon: Nelva Bush, MD;  Location: [place] CV LAB;  Service: Cardiovascular;  Laterality: N/A;   TEE WITHOUT CARDIOVERSION N/A 11/05/2019   Procedure: TRANSESOPHAGEAL ECHOCARDIOGRAM (TEE) WITH PROPOFOL;  Surgeon: Arnoldo Lenis, MD;  [facility];  Service: Endoscopy;  Laterality: N/A;   TEE WITHOUT CARDIOVERSION N/A 05/03/2020   Procedure: TRANSESOPHAGEAL ECHOCARDIOGRAM (TEE);   Surgeon: Burnell Blanks, MD;  Location: [place];  Service: Open Heart Surgery;  Laterality: N/A;    Prior to Admission medications   Medication Sig Start Date End Date Taking? Authorizing Provider  acetaminophen (TYLENOL) 325 MG tablet Take 2 tablets (650 mg total) by mouth every 4 (four) hours as needed for headache or mild pain. 05/15/20  Yes Isaiah Serge, NP  allopurinol (ZYLOPRIM) 300 MG tablet Take 0.5 tablets (150 mg total) by mouth daily. 07/20/20  Yes Bensimhon, Shaune Pascal, MD  apixaban (ELIQUIS) 5 MG TABS tablet Take 1 tablet (5 mg total) by mouth 2 (two) times daily. 05/24/20  Yes Verta Ellen., NP  aspirin 81 MG chewable tablet Chew 1 tablet (81 mg total) by mouth daily. 05/16/20  Yes Isaiah Serge, NP  benzonatate (TESSALON) 100 MG capsule Take 1 capsule (100 mg total) by mouth 3 (three) times daily as needed for cough. 09/27/20  Yes Evans Lance, MD  carvedilol (COREG) 3.125 MG tablet TAKE 1 TABLET IN THE MORNING AND 2 TABLETS IN THE EVENING Patient  taking differently: Take 3.125-6.25 mg by mouth 2 (two) times daily with a meal. 1 tablet in the morning and 2 tablets in the evening 07/20/20  Yes Bensimhon, Shaune Pascal, MD  colchicine 0.6 MG tablet Take 1 tablet (0.6 mg total) by mouth daily as needed. Patient taking differently: Take 0.6 mg by mouth daily as needed (gout). 05/15/20  Yes Isaiah Serge, NP  Dulaglutide (TRULICITY) 1.5 BW/3.8LH SOPN INJECT 1.5MG (1 PEN) SUBCUTANEOUSLY EVERY WEEK Patient taking differently: Inject 1.5 mg into the skin once a week. 10/05/20  Yes Nida, Marella Chimes, MD  insulin degludec (TRESIBA FLEXTOUCH) 100 UNIT/ML FlexTouch Pen Inject 20 Units into the skin daily. 08/25/20  Yes Nida, Marella Chimes, MD  KLOR-CON M20 20 MEQ tablet TAKE 1.5 TABLETS (30 MEQ TOTAL) BY MOUTH 2 (TWO) TIMES DAILY. Patient taking differently: Take 30 mEq by mouth 2 (two) times daily. 08/08/20  Yes Evans Lance, MD  loratadine (CLARITIN) 10 MG tablet TAKE 1 TABLET BY  MOUTH EVERY DAY Patient taking differently: Take 10 mg by mouth daily. 10/03/20  Yes Bensimhon, Shaune Pascal, MD  metolazone (ZAROXOLYN) 2.5 MG tablet Take 1 tablet (2.5 mg total) by mouth as directed. For wight of 200 lb and greater 09/20/20  Yes Bensimhon, Shaune Pascal, MD  Multiple Vitamins-Minerals (ONE-A-DAY MENS 50+) TABS Take 1 tablet by mouth daily with breakfast.   Yes [provider]  spironolactone (ALDACTONE) 25 MG tablet Take 0.5 tablets (12.5 mg total) by mouth daily. 07/20/20  Yes Bensimhon, Shaune Pascal, MD  torsemide (DEMADEX) 20 MG tablet Take 3 tablets (60 mg total) by mouth 2 (two) times daily. 07/28/20  Yes Bensimhon, Shaune Pascal, MD  ACCU-CHEK AVIVA PLUS test strip TEST BLOOD SUGAR TWICE DAILY BEFORE BREAKFAST AND AT BEDTIME 07/19/20   Cassandria Anger, MD  Insulin Pen Needle (BD PEN NEEDLE NANO U/F) 32G X 4 MM MISC 1 each by Does not apply route 4 (four) times daily. 12/23/18   Cassandria Anger, MD    Current Facility-Administered Medications  Medication Dose Route Frequency Provider Last Rate Last Admin   0.9 %  sodium chloride infusion  250 mL Intravenous PRN Shahmehdi, Valeria Batman, MD       acetaminophen (TYLENOL) tablet 650 mg  650 mg Oral Q6H PRN Shahmehdi, Valeria Batman, MD       Or   acetaminophen (TYLENOL) suppository 650 mg  650 mg Rectal Q6H PRN Shahmehdi, Seyed A, MD       aspirin chewable tablet 81 mg  81 mg Oral Daily Shahmehdi, Seyed A, MD       bisacodyl (DULCOLAX) EC tablet 5 mg  5 mg Oral Daily PRN [name], MD       [START ON 10/18/2020] carvedilol (COREG) tablet 3.125 mg  3.125 mg Oral Q breakfast Shahmehdi, Seyed A, MD       carvedilol (COREG) tablet 6.25 mg  6.25 mg Oral Q supper Shahmehdi, Seyed A, MD       Dulaglutide SOPN 1.5 mg  1.5 mg Subcutaneous Weekly Shahmehdi, Seyed A, MD       hydrALAZINE (APRESOLINE) injection 10 mg  10 mg Intravenous Q4H PRN Shahmehdi, Seyed A, MD       HYDROmorphone (DILAUDID) injection 0.5-1 mg  0.5-1 mg Intravenous Q2H PRN  Shahmehdi, Seyed A, MD       insulin aspart (novoLOG) injection 10 Units  10 Units Intravenous Once Fredia Sorrow, MD       insulin degludec (TRESIBA) 100 UNIT/ML  FlexTouch Pen 20 Units  20 Units Subcutaneous Daily Shahmehdi, Seyed A, MD       ipratropium (ATROVENT) nebulizer solution 0.5 mg  0.5 mg Nebulization Q6H PRN Shahmehdi, Seyed A, MD       levalbuterol (XOPENEX) nebulizer solution 0.63 mg  0.63 mg Nebulization Q6H PRN Shahmehdi, Seyed A, MD       metolazone (ZAROXOLYN) tablet 2.5 mg  2.5 mg Oral UD Shahmehdi, Seyed A, MD       ondansetron (ZOFRAN) tablet 4 mg  4 mg Oral Q6H PRN Shahmehdi, Seyed A, MD       Or   ondansetron (ZOFRAN) injection 4 mg  4 mg Intravenous Q6H PRN Shahmehdi, Seyed A, MD       oxyCODONE (Oxy IR/ROXICODONE) immediate release tablet 5 mg  5 mg Oral Q4H PRN Shahmehdi, Seyed A, MD       pantoprazole (PROTONIX) injection 40 mg  40 mg Intravenous Q12H Shahmehdi, Seyed A, MD       senna-docusate (Senokot-S) tablet 1 tablet  1 tablet Oral QHS PRN Shahmehdi, Seyed A, MD       sodium chloride flush (NS) 0.9 % injection 3 mL  3 mL Intravenous Q12H Shahmehdi, Seyed A, MD       sodium chloride flush (NS) 0.9 % injection 3 mL  3 mL Intravenous Q12H Shahmehdi, Seyed A, MD       sodium chloride flush (NS) 0.9 % injection 3 mL  3 mL Intravenous PRN [name], MD       [START ON 10/19/2020] spironolactone (ALDACTONE) tablet 12.5 mg  12.5 mg Oral Daily Shahmehdi, Seyed A, MD       [START ON 10/19/2020] torsemide (DEMADEX) tablet 60 mg  60 mg Oral BID Shahmehdi, Seyed A, MD       traZODone (DESYREL) tablet 25 mg  25 mg Oral QHS PRN Shahmehdi, Valeria Batman, MD       Current Outpatient Medications  Medication Sig Dispense Refill   acetaminophen (TYLENOL) 325 MG tablet Take 2 tablets (650 mg total) by mouth every 4 (four) hours as needed for headache or mild pain.     allopurinol (ZYLOPRIM) 300 MG tablet Take 0.5 tablets (150 mg total) by mouth daily. 15 tablet 6   apixaban  (ELIQUIS) 5 MG TABS tablet Take 1 tablet (5 mg total) by mouth 2 (two) times daily. 180 tablet 3   aspirin 81 MG chewable tablet Chew 1 tablet (81 mg total) by mouth daily.     benzonatate (TESSALON) 100 MG capsule Take 1 capsule (100 mg total) by mouth 3 (three) times daily as needed for cough. 90 capsule 11   carvedilol (COREG) 3.125 MG tablet TAKE 1 TABLET IN THE MORNING AND 2 TABLETS IN THE EVENING (Patient taking differently: Take 3.125-6.25 mg by mouth 2 (two) times daily with a meal. 1 tablet in the morning and 2 tablets in the evening) 270 tablet 6   colchicine 0.6 MG tablet Take 1 tablet (0.6 mg total) by mouth daily as needed. (Patient taking differently: Take 0.6 mg by mouth daily as needed (gout).) 30 tablet 6   Dulaglutide (TRULICITY) 1.5 MO/2.9UT SOPN INJECT 1.5MG (1 PEN) SUBCUTANEOUSLY EVERY WEEK (Patient taking differently: Inject 1.5 mg into the skin once a week.) 6 mL 1   insulin degludec (TRESIBA FLEXTOUCH) 100 UNIT/ML FlexTouch Pen Inject 20 Units into the skin daily. 15 mL 1   KLOR-CON M20 20 MEQ tablet TAKE 1.5 TABLETS (30 MEQ TOTAL) BY MOUTH 2 (  TWO) TIMES DAILY. (Patient taking differently: Take 30 mEq by mouth 2 (two) times daily.) 270 tablet 3   loratadine (CLARITIN) 10 MG tablet TAKE 1 TABLET BY MOUTH EVERY DAY (Patient taking differently: Take 10 mg by mouth daily.) 90 tablet 3   metolazone (ZAROXOLYN) 2.5 MG tablet Take 1 tablet (2.5 mg total) by mouth as directed. For wight of 200 lb and greater 10 tablet 1   Multiple Vitamins-Minerals (ONE-A-DAY MENS 50+) TABS Take 1 tablet by mouth daily with breakfast.     spironolactone (ALDACTONE) 25 MG tablet Take 0.5 tablets (12.5 mg total) by mouth daily. 15 tablet 6   torsemide (DEMADEX) 20 MG tablet Take 3 tablets (60 mg total) by mouth 2 (two) times daily. 180 tablet 3   ACCU-CHEK AVIVA PLUS test strip TEST BLOOD SUGAR TWICE DAILY BEFORE BREAKFAST AND AT BEDTIME 200 strip 1   Insulin Pen Needle (BD PEN NEEDLE NANO U/F) 32G X 4 MM  MISC 1 each by Does not apply route 4 (four) times daily. 150 each 5    Allergies as of 10/17/2020   (No Known Allergies)    Family History  Problem Relation Age of Onset   Heart attack Mother    Hypertension Mother    Heart attack Father    Hypertension Father    Heart attack Brother    Colon cancer Neg Hx     Social History   Socioeconomic History   Marital status: Married    Spouse name: Not on file   Number of children: 2   Years of education: Not on file   Highest education level: Not on file  Occupational History   Occupation: Designer, industrial/product    Employer: RETIRED  Tobacco Use   Smoking status: Former    Packs/day: 1.00    Years: 10.00    Pack years: 10.00    Types: Cigarettes    Quit date: 03/26/1986    Years since quitting: 34.5   Smokeless tobacco: Never   Tobacco comments:    "stopped cigarette  smoking 1988"  Vaping Use   Vaping Use: Never used  Substance and Sexual Activity   Alcohol use: Not Currently    Alcohol/week: 0.0 standard drinks    Comment: "quit alcohol ~ 2007"   Drug use: No   Sexual activity: Yes    Partners: Female  Other Topics Concern   Not on file  Social History Narrative   Not on file   Social Determinants of Health   Financial Resource Strain: Not on file  Food Insecurity: No Food Insecurity   Worried About Running Out of Food in the Last Year: Never true   [place] in the Last Year: Never true  Transportation Needs: No Transportation Needs   Lack of Transportation (Medical): No   Lack of Transportation (Non-Medical): No  Physical Activity: Not on file  Stress: Not on file  Social Connections: Not on file  Intimate Partner Violence: Not on file    Review of Systems: Gen: Denies fever, chills, loss of appetite, change in weight or weight loss CV: Denies chest pain, heart palpitations, syncope. Endorses ongoing lower extremity edema Resp: Denies shortness of breath with rest, cough, wheezing GI: Denies  dysphagia or odynophagia. Denies vomiting blood, jaundice, and fecal incontinence.  GU : Denies urinary burning, urinary frequency, urinary incontinence.  MS: Denies joint pain,swelling, cramping Derm: Denies rash, itching, dry skin Psych: Denies depression, anxiety,confusion, or memory loss Heme: Denies bruising, bleeding,  Physical  Exam: Vital signs in last 24 hours: Temp:  [97.7 F (36.5 C)-98.1 F (36.7 C)] 97.7 F (36.5 C) (07/25 1219) Pulse Rate:  [58-80] 64 (07/25 1219) Resp:  [15-23] 18 (07/25 1219) BP: (103-121)/(41-62) 111/46 (07/25 1219) SpO2:  [95 %-100 %] 100 % (07/25 1219) Weight:  [88.9 kg] 88.9 kg (07/25 0755)   General:   Alert,  Well-developed, well-nourished, pleasant and cooperative in NAD Lungs:  Clear throughout to auscultation.   No wheezes, crackles, or rhonchi. No acute distress. Heart:  Regular rate and rhythm; no murmurs, clicks, rubs,  or gallops. Abdomen:  Soft, nondistended. No masses, hepatosplenomegaly or hernias noted. Normal bowel sounds, without guarding, and without rebound.  Mild TTP just left of umbilicus. Rectal:  Deferred until time of colonoscopy.   Msk:  Symmetrical without gross deformities. Normal posture. Pulses:  Normal pulses noted. Extremities:  lower extremity edema. Neurologic:  Alert and  oriented x4;  grossly normal neurologically. Skin:  Intact without significant lesions or rashes.  Psych:  Alert and cooperative. Normal mood and affect.  Intake/Output from previous day: No intake/output data recorded. Intake/Output this shift: No intake/output data recorded.  Lab Results: Recent Labs    10/17/20 0812  WBC 9.9  HGB 7.3*  HCT 21.6*  PLT 122*   BMET Recent Labs    10/17/20 0812  NA 126*  K 3.9  CL 92*  CO2 25  GLUCOSE 470*  BUN 104*  CREATININE 1.50*  CALCIUM 8.2*   LFT Recent Labs    10/17/20 0812  PROT 5.8*  ALBUMIN 2.7*  AST 34  ALT 20  ALKPHOS 146*  BILITOT 1.4*    Studies/Results: DG Chest  Port 1 View  Result Date: 10/17/2020 CLINICAL DATA:  Weakness, fall EXAM: PORTABLE CHEST 1 VIEW COMPARISON:  06/08/2020 FINDINGS: Low lung volumes. No consolidation or edema. No pleural effusion or pneumothorax. Stable cardiomediastinal contours. Post TAVR. Left chest wall dual lead pacemaker. IMPRESSION: No acute process in the chest. Electronically Signed   By: Macy Mis M.D.   On: 10/17/2020 08:38    Impression: John [name] is a pleasant 75 year old male with significant cardiac history to include atrial fibrillation, systolic heart failure, presence of pacemaker, aortic valve replacement, DM, HTN, hyperlipidemia and NASH/cirrhosis, who presented to the ED earlier today for c/o generalized weakness for the past few days, that began after a fall on Saturday.   Patient was found to have hemoglobin of 7.3 in ED with positive FOBT and noted black, tarry stools on ED MDs exam, however, patient was unsure of any occurrences of hematochezia or melena prior to admission. Two units PRBCs ordered by ED physician. Currently on eliquis, last dose was yesterday. Denies NSAIDs. Due to likelihood of upper GI source as cause of anemia given black tarry stools and elevated BUN, cannot rule out esophageal varices r/t recent cirrhosis findings. Additional differentials include gastritis, duodenitis, PUD, GAVE, and AMVs. Will start Octreotide for GI bleed as well as PPI BID at this time. He will need Rocephin for SBP prophylaxis. Notably, Troponins mildly elevated in ED 58 initially and 57 on repeat, suspect related to demand ischemia, Cardiology onboard. Will plan for EGD tomorrow as long as cardiology clears patient for procedure.  Patient had recent extensive outpatient GI work up that indicated NASH with possible cirrhosis. AST and ALT WNL, Alk Phos 146, improved from 307 in June. Total bilirubin also improved since June at 1.4 down from 2.4. INR up to 1.5 from 1.1 in June.  Clinically, patient appears stable at  this time, no ascites present, patient has chronic LE edema. Alert and oriented without signs of hepatic encephalopathy at this time.  MELD-Na score: 24, previously 15 in June.  Sodium 126, BUN 104, significantly elevated from 1 month ago (19). Creatnine appears relatively stable in relation to baseline at 1.5 today.   Plan: -Start Octreotide infusion 50 mcg/hour with 91mg loading dose -Start Rocephin 1g IV q24 hours for SBP prophylaxis -Hold eliquis -Plan for Possible EGD tomorrow if cleared by cardiology -Clear Liquid Diet, NPO at midnight -Agree with transfusing 2 units PRBCs -Protonix 480mBID IV -Continue to trend H&H -Continue to monitor mental status for changes indicating HE  Indications, risks and benefits of procedure discussed in detail with patient and significant other at bedside. Patient verbalized understanding and is in agreement to proceed with EGD as long as cleared by cards for procedure.    LOS: 0 days    10/17/2020, 1:27 PM  Mckay Brandt L. CaAlver SorrowMSN, APRN, AGNP-C Adult-Gerontology Nurse Practitioner [facility]

## 2020-10-17 NOTE — H&P (Signed)
History and Physical   Patient: John [name]                            PCP: Erven Colla, DO                    DOB: 11-14-1945            DOA: 10/17/2020 MPN:[id number]             DOS: 10/17/2020, 1:15 PM  Patient coming from:   HOME  I have personally reviewed patient's medical records, in electronic medical records, including:  [place] [facility], and care everywhere.    Chief Complaint:   Chief Complaint  Patient presents with   Weakness    History of present illness:    John [name] is a 75 y.o. male with medical history significant of   combined systolic/diastolic congestive heart failure IIIB, A. fib/flutter - w pacemaker on Eliquis, ICM,-cath September 2021, severe aortic valve stenosis -post TAVR February 2022, HTN, HLD, DMII, gout, CKD IIIb...Marland KitchenMarland Kitchen Presented with generalized weaknesses, fall 3 days ago without any injuries patient denies hitting his head or sustaining any physical trauma.   EMS noted orthostatic hypotensive   Patient Denies having: Fever, Chills, Cough, SOB, Chest Pain, Abd pain, N/V/D, headache, dizziness, lightheadedness,  Dysuria, Joint pain, rash, open wounds  ED Course:  Blood pressure 131/65, pulse 82, temperature 97.7 F (36.5 C), temperature source Oral, resp. rate 20, height 6' (1.829 m), weight 88.9 kg, SpO2 100 %..vital Abnormal labs; CBC WBC 9.9, hemoglobin 7.3, hematocrit 21.6, GFR 49, sodium 126, potassium 3.9, BUN 104, creatinine 1.5, glucose 470, Hemoccult positive x2 Influenza A/B, SARS-CoV-2-negative     Review of Systems: As per HPI, otherwise 10 point review of systems were negative.   ----------------------------------------------------------------------------------------------------------------------  No Known Allergies  Home MEDs:  Prior to Admission medications   Medication Sig Start Date End Date Taking? Authorizing Provider  acetaminophen (TYLENOL) 325 MG tablet Take 2 tablets (650 mg total) by mouth every 4  (four) hours as needed for headache or mild pain. 05/15/20  Yes Isaiah Serge, NP  allopurinol (ZYLOPRIM) 300 MG tablet Take 0.5 tablets (150 mg total) by mouth daily. 07/20/20  Yes Bensimhon, Shaune Pascal, MD  apixaban (ELIQUIS) 5 MG TABS tablet Take 1 tablet (5 mg total) by mouth 2 (two) times daily. 05/24/20  Yes Verta Ellen., NP  aspirin 81 MG chewable tablet Chew 1 tablet (81 mg total) by mouth daily. 05/16/20  Yes Isaiah Serge, NP  benzonatate (TESSALON) 100 MG capsule Take 1 capsule (100 mg total) by mouth 3 (three) times daily as needed for cough. 09/27/20  Yes Evans Lance, MD  carvedilol (COREG) 3.125 MG tablet TAKE 1 TABLET IN THE MORNING AND 2 TABLETS IN THE EVENING Patient taking differently: Take 3.125-6.25 mg by mouth 2 (two) times daily with a meal. 1 tablet in the morning and 2 tablets in the evening 07/20/20  Yes Bensimhon, Shaune Pascal, MD  colchicine 0.6 MG tablet Take 1 tablet (0.6 mg total) by mouth daily as needed. Patient taking differently: Take 0.6 mg by mouth daily as needed (gout). 05/15/20  Yes Isaiah Serge, NP  Dulaglutide (TRULICITY) 1.5 MG/8.6PY SOPN INJECT 1.5MG (1 PEN) SUBCUTANEOUSLY EVERY WEEK Patient taking differently: Inject 1.5 mg into the skin once a week. 10/05/20  Yes Nida, Marella Chimes, MD  insulin degludec (TRESIBA FLEXTOUCH) 100 UNIT/ML  FlexTouch Pen Inject 20 Units into the skin daily. 08/25/20  Yes Nida, Marella Chimes, MD  KLOR-CON M20 20 MEQ tablet TAKE 1.5 TABLETS (30 MEQ TOTAL) BY MOUTH 2 (TWO) TIMES DAILY. Patient taking differently: Take 30 mEq by mouth 2 (two) times daily. 08/08/20  Yes Evans Lance, MD  loratadine (CLARITIN) 10 MG tablet TAKE 1 TABLET BY MOUTH EVERY DAY Patient taking differently: Take 10 mg by mouth daily. 10/03/20  Yes Bensimhon, Shaune Pascal, MD  metolazone (ZAROXOLYN) 2.5 MG tablet Take 1 tablet (2.5 mg total) by mouth as directed. For wight of 200 lb and greater 09/20/20  Yes Bensimhon, Shaune Pascal, MD  Multiple  Vitamins-Minerals (ONE-A-DAY MENS 50+) TABS Take 1 tablet by mouth daily with breakfast.   Yes [provider]  spironolactone (ALDACTONE) 25 MG tablet Take 0.5 tablets (12.5 mg total) by mouth daily. 07/20/20  Yes Bensimhon, Shaune Pascal, MD  torsemide (DEMADEX) 20 MG tablet Take 3 tablets (60 mg total) by mouth 2 (two) times daily. 07/28/20  Yes Bensimhon, Shaune Pascal, MD  ACCU-CHEK AVIVA PLUS test strip TEST BLOOD SUGAR TWICE DAILY BEFORE BREAKFAST AND AT BEDTIME 07/19/20   Cassandria Anger, MD  Insulin Pen Needle (BD PEN NEEDLE NANO U/F) 32G X 4 MM MISC 1 each by Does not apply route 4 (four) times daily. 12/23/18   Cassandria Anger, MD    PRN MEDs: sodium chloride, acetaminophen **OR** acetaminophen, bisacodyl, hydrALAZINE, HYDROmorphone (DILAUDID) injection, ipratropium, levalbuterol, ondansetron **OR** ondansetron (ZOFRAN) IV, oxyCODONE, senna-docusate, sodium chloride flush, traZODone  Past Medical History:  Diagnosis Date   Atrial flutter ([place]) 12/2010   Admitted with symptomatic bradycardia (HR 40s), atrial flutter with slow ventricular response 12/2010 + volume overload; AV nodal agents d/c'd and Pradaxa started; RFA in 01/2011   CHF (congestive heart failure) (HCC)    Chronic combined systolic and diastolic heart failure ([place])    a. echo 01/06/11: mild LVH, EF 65-70%, mod to severe LAE, mild RVE, mild RAE, PASP 32;   TEE 10/12: EF 45-50% b. EF 45 to 50% by echo in 07/2018   Class 2 severe obesity due to excess calories with serious comorbidity and body mass index (BMI) of 37.0 to 37.9 in adult ([place]) 07/17/2017   Diabetes mellitus    non insulin dependant   Hematoma, chest wall 05/15/2020   Hyperlipidemia    Hypertension    Osteoarthritis    Polyarticular gout 05/15/2020   Presence of permanent cardiac pacemaker    PSVT (paroxysmal supraventricular tachycardia) (HCC)    Possibly atrial flutter   S/P TAVR (transcatheter aortic valve replacement) 05/03/2020   s/p TAVR with a  29 mm Edwards S3U via the subclavian approach with Dr. Angelena Form & Dr. Cyndia Bent    Severe aortic stenosis     Past Surgical History:  Procedure Laterality Date   ATRIAL FLUTTER ABLATION N/A 02/07/2011   Procedure: ATRIAL FLUTTER ABLATION;  Surgeon: Evans Lance, MD;  Location[facility] CATH LAB;  Service: Cardiovascular;  Laterality: N/A;   BIV UPGRADE N/A 06/08/2020   Procedure: BIV PPM UPGRADE;  Surgeon: Evans Lance, MD;  Location: [place] CV LAB;  Service: Cardiovascular;  Laterality: N/A;   CARDIAC ELECTROPHYSIOLOGY STUDY AND ABLATION  02/07/11   CARDIOVERSION N/A 03/23/2016   Procedure: CARDIOVERSION;  Surgeon: Evans Lance, MD;  Location: [place];  Service: Cardiovascular;  Laterality: N/A;   COLONOSCOPY N/A 04/17/2017   Procedure: COLONOSCOPY;  Surgeon: Rogene Houston, MD;  [facility];  Service: Endoscopy;  Laterality: N/A;  [place] N/A 08/29/2015   Procedure: Pacemaker Implant;  Surgeon: Evans Lance, MD;  Location: [place] CV LAB;  Service: Cardiovascular;  Laterality: N/A;   EP IMPLANTABLE DEVICE N/A 12/07/2015   Procedure: PPM Lead Revision/Repair;  Surgeon: Evans Lance, MD;  Location: [place] CV LAB;  Service: Cardiovascular;  Laterality: N/A;   KNEE ARTHROSCOPY  2005   left   LUMBAR SPINE SURGERY     "I've had 6 ORs 1972 thru 2004"   POLYPECTOMY  04/17/2017   Procedure: POLYPECTOMY;  Surgeon: Rogene Houston, MD;  [facility];  Service: Endoscopy;;  transverse colon x3;   RIGHT HEART CATH N/A 04/28/2020   Procedure: RIGHT HEART CATH;  Surgeon: Jolaine Artist, MD;  Location: [place] CV LAB;  Service: Cardiovascular;  Laterality: N/A;   RIGHT/LEFT HEART CATH AND CORONARY ANGIOGRAPHY N/A 12/21/2019   Procedure: RIGHT/LEFT HEART CATH AND CORONARY ANGIOGRAPHY;  Surgeon: Nelva Bush, MD;  Location: [place] CV LAB;  Service: Cardiovascular;  Laterality: N/A;   TEE WITHOUT CARDIOVERSION N/A 11/05/2019    Procedure: TRANSESOPHAGEAL ECHOCARDIOGRAM (TEE) WITH PROPOFOL;  Surgeon: Arnoldo Lenis, MD;  [facility];  Service: Endoscopy;  Laterality: N/A;   TEE WITHOUT CARDIOVERSION N/A 05/03/2020   Procedure: TRANSESOPHAGEAL ECHOCARDIOGRAM (TEE);  Surgeon: Burnell Blanks, MD;  Location: [place];  Service: Open Heart Surgery;  Laterality: N/A;     reports that he quit smoking about 34 years ago. His smoking use included cigarettes. He has a 10.00 pack-year smoking history. He has never used smokeless tobacco. He reports previous alcohol use. He reports that he does not use drugs.   Family History  Problem Relation Age of Onset   Heart attack Mother    Hypertension Mother    Heart attack Father    Hypertension Father    Heart attack Brother    Colon cancer Neg Hx     Physical Exam:   Vitals:   10/17/20 1100 10/17/20 1130 10/17/20 1204 10/17/20 1219  BP: (!) 121/50 118/62 (!) 115/43 (!) 111/46  Pulse: 72 70 68 64  Resp: (!) 21 15 18 18   Temp:   97.8 F (36.6 C) 97.7 F (36.5 C)  TempSrc:   Oral Oral  SpO2: 100% 100% 100% 100%  Weight:      Height:       Constitutional: NAD, calm, comfortable Eyes: PERRL, lids and conjunctivae normal ENMT: Mucous membranes are moist. Posterior pharynx clear of any exudate or lesions.Normal dentition.  Neck: normal, supple, no masses, no thyromegaly Respiratory: clear to auscultation bilaterally, no wheezing, no crackles. Normal respiratory effort. No accessory muscle use.  Cardiovascular: Regular rate and rhythm, no murmurs / rubs / gallops. No extremity edema. 2+ pedal pulses. No carotid bruits.  Abdomen: no tenderness, no masses palpated. No hepatosplenomegaly. Bowel sounds positive.  Musculoskeletal: no clubbing / cyanosis. No joint deformity upper and lower extremities. Good ROM, no contractures. Normal muscle tone.  Neurologic: CN II-XII grossly intact. Sensation intact, DTR normal. Strength 5/5 in all 4.  Psychiatric: Normal  judgment and insight. Alert and oriented x 3. Normal mood.  Skin: no rashes, lesions, ulcers. No induration Decubitus/ulcers:  Wounds: per nursing documentation         Labs on admission:    I have personally reviewed following labs and imaging studies  CBC: Recent Labs  Lab 10/17/20 0812  WBC 9.9  NEUTROABS 7.3  HGB 7.3*  HCT 21.6*  MCV 98.2  PLT 638*   Basic Metabolic Panel: Recent Labs  Lab 10/17/20 0812  NA 126*  K 3.9  CL 92*  CO2 25  GLUCOSE 470*  BUN 104*  CREATININE 1.50*  CALCIUM 8.2*   GFR: Estimated Creatinine Clearance: 47.4 mL/min (A) (by C-G formula based on SCr of 1.5 mg/dL (H)). Liver Function Tests: Recent Labs  Lab 10/17/20 0812  AST 34  ALT 20  ALKPHOS 146*  BILITOT 1.4*  PROT 5.8*  ALBUMIN 2.7*   No results for input(s): LIPASE, AMYLASE in the last 168 hours. No results for input(s): AMMONIA in the last 168 hours. Coagulation Profile: No results for input(s): INR, PROTIME in the last 168 hours. Cardiac Enzymes: No results for input(s): CKTOTAL, CKMB, CKMBINDEX, TROPONINI in the last 168 hours. BNP (last 3 results) No results for input(s): PROBNP in the last 8760 hours. HbA1C: No results for input(s): HGBA1C in the last 72 hours. CBG: Recent Labs  Lab 10/17/20 1032  GLUCAP 428*   Lipid Profile: No results for input(s): CHOL, HDL, LDLCALC, TRIG, CHOLHDL, LDLDIRECT in the last 72 hours. Thyroid Function Tests: No results for input(s): TSH, T4TOTAL, FREET4, T3FREE, THYROIDAB in the last 72 hours. Anemia Panel: No results for input(s): VITAMINB12, FOLATE, FERRITIN, TIBC, IRON, RETICCTPCT in the last 72 hours. Urine analysis:    Component Value Date/Time   COLORURINE YELLOW 05/02/2020 [place] 05/02/2020 1449   LABSPEC 1.005 05/02/2020 1449   PHURINE 7.0 05/02/2020 1449   GLUCOSEU >=500 (A) 05/02/2020 1449   HGBUR NEGATIVE 05/02/2020 1449   BILIRUBINUR NEGATIVE 05/02/2020 1449   KETONESUR NEGATIVE  05/02/2020 1449   PROTEINUR NEGATIVE 05/02/2020 1449   UROBILINOGEN 0.2 01/05/2014 0744   NITRITE NEGATIVE 05/02/2020 1449   LEUKOCYTESUR NEGATIVE 05/02/2020 1449     Radiologic Exams on Admission:   DG Chest Port 1 View  Result Date: 10/17/2020 CLINICAL DATA:  Weakness, fall EXAM: PORTABLE CHEST 1 VIEW COMPARISON:  06/08/2020 FINDINGS: Low lung volumes. No consolidation or edema. No pleural effusion or pneumothorax. Stable cardiomediastinal contours. Post TAVR. Left chest wall dual lead pacemaker. IMPRESSION: No acute process in the chest. Electronically Signed   By: Macy Mis M.D.   On: 10/17/2020 08:38    EKG:   Independently reviewed.  Orders placed or performed during the hospital encounter of 10/17/20   ED EKG   ED EKG   EKG 12-Lead   EKG 12-Lead   EKG 12-Lead   ---------------------------------------------------------------------------------------------------------------------------------------    Assessment / Plan:   Principal Problem:   GIB (gastrointestinal bleeding) Active Problems:   DM type 2 causing vascular disease (HCC)   Mixed hyperlipidemia   Essential hypertension, benign   Atrial flutter (HCC)   Current use of long term anticoagulation   CKD (chronic kidney disease) stage 3, GFR 30-59 ml/min (HCC)   Hyponatremia   Thrombocytopenia (HCC)   Permanent atrial fibrillation (HCC)   S/P TAVR (transcatheter aortic valve replacement)   Polyarticular gout  Principal Problem:   GIB (gastrointestinal bleeding) -In the face of chronic anticoagulation with Eliquis -Hemoccult positive, hemoglobin is 7.3 (hemoglobin was 14.6 on 09/12/2020) -Patient's will be admitted, GI consulted for further evaluation -Withholding chronic anticoagulation Eliquis, -Monitoring vitals closely... Currently hemodynamically stable -Initiating IV Protonix -Pursuing with 2 units of PRBC transfusion as it was initiated in ED  Acute on chronic anemia/anemia of chronic disease,  exacerbated by current GI bleed -Hemoglobin 7.3, hematocrit 21.6. Hemoccult positive -2 units  of PRBC transfusion has been ordered by ED, will pursue with very slow transfusion -We will monitor very closely adverse effect including volume overdose, may use diuretics in between and after blood transfusion   History of systolic/diastolic congestive heart failure With holding diuretics due to hypotension, hyponatremia, elevated BUN/creatinine -Monitoring daily weight and I's and O's  -Due to elevated BUN/creatinine, hyponatremia -we will withhold Zaroxolyn, Aldactone, and Demadex for next 24 hours -We will monitor for any volume overload or shortness of breath utilize as needed Lasix  History of severe aortic stenosis-status post TAVR -closely, asymptomatic  Active Problems:  DM type II -hyperglycemia -Home medication of Tyler Aas and Trulicity will be held, [place] q. Weekly taken at) -Checking CBG QA CHS, SSI coverage   Mixed hyperlipidemia -continue statin when tolerating p.o.    Essential hypertension, benign -monitoring BP closely, continue Coreg, will continue Aldactone in a.m.  Dysrhythmia-atrial A. fib/flutter/bradycardia - currently paced on a pacemaker   Current use of long term anticoagulation -due to A. fib on Eliquis (holding due to GI bleed)  CKD (chronic kidney disease) stage 3, GFR 30-59 ml/min (HCC) -BUN 104, creatinine 1.50, monitoring, avoiding nephrotoxins, gentle IV fluid hydration  Hyponatremia -acute on chronic, monitoring, with holding diuretics  Thrombocytopenia (HCC) -monitoring closely, will transfuse as needed     Polyarticular gout -Zoom allopurinol, colchicine as needed when tolerating p.o.  Generalized weaknesses/status post fall -Consulting PT OT for evaluation recommendations  Cultures:  -none Antimicrobial: -none  Consults called:  GI -------------------------------------------------------------------------------------------------------------------------------------------- DVT prophylaxis: SCD/Compression stockings Code Status:   Code Status: Full Code   Admission status: Patient will be admitted as Inpatient, with a greater than 2 midnight length of stay. Level of care: Telemetry   Family Communication:  none at bedside  (The above findings and plan of care has been discussed with patient in detail, the patient expressed understanding and agreement of above plan)  --------------------------------------------------------------------------------------------------------------------------------------------------  Disposition Plan:  Anticipated 1-2 days Status is: Inpatient  Remains inpatient appropriate because:Inpatient level of care appropriate due to severity of illness  Dispo: The patient is from: Home              Anticipated d/c is to: Home              Patient currently is not medically stable to d/c.   Difficult to place patient No    -------------------------------------------------------------------------------------------------------------------------------------------------  Time spent: > than  66  Min.   SIGNED: [name], MD, FHM. [facility],  Pager (Please use [email or web address] to page to text)  If 7PM-7AM, please contact night-coverage [email or web address],  10/17/2020, 1:15 PM

## 2020-10-17 NOTE — ED Triage Notes (Signed)
Pt arrived by [facility] for weakness x 3 days. Pt had a fall 3 days ago, denies hitting head, pt on eliquis. Pt had valve replacement within the past year. Per EMS pt was orthostatic, EMS gave 500 bolus of NS.

## 2020-10-17 NOTE — Progress Notes (Signed)
Second unit of PRBC started at 1845. Pt advised of s/s to report, stated understanding. 15 minutes post start, VSS, pt denies any c/o pain or SOB. Per MD Shamehdi, blood is to infuse slowly to decrease chance of fluid overload given pt's extensive cardiac history. Blood started at 120 ml/hr, once first 15 minutes passed, rate increased to 135 ml/hr.

## 2020-10-17 NOTE — ED Provider Notes (Addendum)
[facility] EMERGENCY DEPARTMENT Provider Note   CSN: [id number] Arrival date & time: 10/17/20  0745     History Chief Complaint  Patient presents with   Weakness    John [name] is a 75 y.o. male.  Patient followed by cardiology as an established congestive heart failure patient.  He is known to have chronic combined systolic and diastolic heart failure.  He has systolic heart failure due to an ICM had a cath in September 2021 no coronary artery disease.  History of CHB status post PPM 2017.  History of hypertension type 2 diabetes atrial flutter stage IIIb congestive heart failure.  And severe aortic stenosis status post TAVR February 2022.  Presents today with complaint of weakness and had a fall about 3 days ago without an injury.  Patient states he did not hit his head.  Patient is on Eliquis for his atrial arrhythmia.  He has felt very weak the past 3 days.  Denies any black or blood in his stools.  EMS stated that his blood pressures were low in the sense that he was orthostatic.  They gave him a 500 cc bolus of normal saline.  Patient denies any chest pain.  No one else is sick at home.  He is fully vaccinated and boosted for COVID.      Past Medical History:  Diagnosis Date   Atrial flutter ([place]) 12/2010   Admitted with symptomatic bradycardia (HR 40s), atrial flutter with slow ventricular response 12/2010 + volume overload; AV nodal agents d/c'd and Pradaxa started; RFA in 01/2011   CHF (congestive heart failure) (HCC)    Chronic combined systolic and diastolic heart failure ([place])    a. echo 01/06/11: mild LVH, EF 65-70%, mod to severe LAE, mild RVE, mild RAE, PASP 32;   TEE 10/12: EF 45-50% b. EF 45 to 50% by echo in 07/2018   Class 2 severe obesity due to excess calories with serious comorbidity and body mass index (BMI) of 37.0 to 37.9 in adult ([place]) 07/17/2017   Diabetes mellitus    non insulin dependant   Hematoma, chest wall 05/15/2020   Hyperlipidemia     Hypertension    Osteoarthritis    Polyarticular gout 05/15/2020   Presence of permanent cardiac pacemaker    PSVT (paroxysmal supraventricular tachycardia) (HCC)    Possibly atrial flutter   S/P TAVR (transcatheter aortic valve replacement) 05/03/2020   s/p TAVR with a 29 mm Edwards S3U via the subclavian approach with Dr. Angelena Form & Dr. Cyndia Bent    Severe aortic stenosis     Patient Active Problem List   Diagnosis Date Noted   Hematoma, chest wall 05/15/2020   Polyarticular gout 05/15/2020   S/P TAVR (transcatheter aortic valve replacement) 05/03/2020   Severe aortic stenosis 04/25/2020   Chronic atrial fibrillation ([place]) 04/25/2020   Acute on chronic systolic CHF (congestive heart failure) ([place]) 04/25/2020   Lobar pneumonia ([place]) 03/10/2020   Permanent atrial fibrillation ([place]) 03/10/2020   Calculus of gallbladder without cholecystitis without obstruction    Thrombocytopenia ([place]) 03/09/2020   Hypoalbuminemia 03/09/2020   Dehydration 03/09/2020   Transaminitis 03/09/2020   Multifocal pneumonia 03/09/2020   Diffuse papular rash 03/09/2020   NASH (nonalcoholic steatohepatitis) 03/03/2020   Elevated LFTs 03/03/2020   Lower leg edema 10/14/2019   Class 2 obesity    Hyponatremia 11/11/2018   CKD (chronic kidney disease) stage 3, GFR 30-59 ml/min (HCC)    Paroxysmal atrial fibrillation ([place])    Pacemaker 03/02/2018  Atrial pacemaker lead displacement 12/07/2015   NSVT (nonsustained ventricular tachycardia) ([place]) 08/28/2015   Bradycardia 08/25/2015   Shoulder pain 07/14/2012   Current use of long term anticoagulation 05/04/2011   Atrial flutter ([place]) 01/05/2011   DM type 2 causing vascular disease ([place]) 12/23/2009   Mixed hyperlipidemia 12/23/2009   Essential hypertension, benign 12/23/2009   DEGENERATIVE JOINT DISEASE 12/23/2009    Past Surgical History:  Procedure Laterality Date   ATRIAL FLUTTER ABLATION N/A 02/07/2011   Procedure: ATRIAL FLUTTER ABLATION;  Surgeon:  Evans Lance, MD;  Location: [facility] CATH LAB;  Service: Cardiovascular;  Laterality: N/A;   BIV UPGRADE N/A 06/08/2020   Procedure: BIV PPM UPGRADE;  Surgeon: Evans Lance, MD;  Location: [place] CV LAB;  Service: Cardiovascular;  Laterality: N/A;   CARDIAC ELECTROPHYSIOLOGY STUDY AND ABLATION  02/07/11   CARDIOVERSION N/A 03/23/2016   Procedure: CARDIOVERSION;  Surgeon: Evans Lance, MD;  Location: [place];  Service: Cardiovascular;  Laterality: N/A;   COLONOSCOPY N/A 04/17/2017   Procedure: COLONOSCOPY;  Surgeon: Rogene Houston, MD;  [facility] ENDO [facility];  Service: Endoscopy;  Laterality: N/A;  [place] N/A 08/29/2015   Procedure: Pacemaker Implant;  Surgeon: Evans Lance, MD;  Location: [place] CV LAB;  Service: Cardiovascular;  Laterality: N/A;   EP IMPLANTABLE DEVICE N/A 12/07/2015   Procedure: PPM Lead Revision/Repair;  Surgeon: Evans Lance, MD;  Location: [place] CV LAB;  Service: Cardiovascular;  Laterality: N/A;   KNEE ARTHROSCOPY  2005   left   LUMBAR SPINE SURGERY     "I've had 6 ORs 1972 thru 2004"   POLYPECTOMY  04/17/2017   Procedure: POLYPECTOMY;  Surgeon: Rogene Houston, MD;  [facility];  Service: Endoscopy;;  transverse colon x3;   RIGHT HEART CATH N/A 04/28/2020   Procedure: RIGHT HEART CATH;  Surgeon: Jolaine Artist, MD;  Location: [place] CV LAB;  Service: Cardiovascular;  Laterality: N/A;   RIGHT/LEFT HEART CATH AND CORONARY ANGIOGRAPHY N/A 12/21/2019   Procedure: RIGHT/LEFT HEART CATH AND CORONARY ANGIOGRAPHY;  Surgeon: Nelva Bush, MD;  Location: [place] CV LAB;  Service: Cardiovascular;  Laterality: N/A;   TEE WITHOUT CARDIOVERSION N/A 11/05/2019   Procedure: TRANSESOPHAGEAL ECHOCARDIOGRAM (TEE) WITH PROPOFOL;  Surgeon: Arnoldo Lenis, MD;  [facility];  Service: Endoscopy;  Laterality: N/A;   TEE WITHOUT CARDIOVERSION N/A 05/03/2020   Procedure: TRANSESOPHAGEAL ECHOCARDIOGRAM (TEE);   Surgeon: Burnell Blanks, MD;  Location: [place];  Service: Open Heart Surgery;  Laterality: N/A;       Family History  Problem Relation Age of Onset   Heart attack Mother    Hypertension Mother    Heart attack Father    Hypertension Father    Heart attack Brother    Colon cancer Neg Hx     Social History   Tobacco Use   Smoking status: Former    Packs/day: 1.00    Years: 10.00    Pack years: 10.00    Types: Cigarettes    Quit date: 03/26/1986    Years since quitting: 34.5   Smokeless tobacco: Never   Tobacco comments:    "stopped cigarette  smoking 1988"  Vaping Use   Vaping Use: Never used  Substance Use Topics   Alcohol use: Not Currently    Alcohol/week: 0.0 standard drinks    Comment: "quit alcohol ~ 2007"   Drug use: No    Home Medications Prior  to Admission medications   Medication Sig Start Date End Date Taking? Authorizing Provider  acetaminophen (TYLENOL) 325 MG tablet Take 2 tablets (650 mg total) by mouth every 4 (four) hours as needed for headache or mild pain. 05/15/20  Yes Isaiah Serge, NP  allopurinol (ZYLOPRIM) 300 MG tablet Take 0.5 tablets (150 mg total) by mouth daily. 07/20/20  Yes Bensimhon, Shaune Pascal, MD  apixaban (ELIQUIS) 5 MG TABS tablet Take 1 tablet (5 mg total) by mouth 2 (two) times daily. 05/24/20  Yes Verta Ellen., NP  aspirin 81 MG chewable tablet Chew 1 tablet (81 mg total) by mouth daily. 05/16/20  Yes Isaiah Serge, NP  benzonatate (TESSALON) 100 MG capsule Take 1 capsule (100 mg total) by mouth 3 (three) times daily as needed for cough. 09/27/20  Yes Evans Lance, MD  carvedilol (COREG) 3.125 MG tablet TAKE 1 TABLET IN THE MORNING AND 2 TABLETS IN THE EVENING Patient taking differently: Take 3.125-6.25 mg by mouth 2 (two) times daily with a meal. 1 tablet in the morning and 2 tablets in the evening 07/20/20  Yes Bensimhon, Shaune Pascal, MD  colchicine 0.6 MG tablet Take 1 tablet (0.6 mg total) by mouth daily as  needed. Patient taking differently: Take 0.6 mg by mouth daily as needed (gout). 05/15/20  Yes Isaiah Serge, NP  Dulaglutide (TRULICITY) 1.5 GU/4.4IH SOPN INJECT 1.5MG (1 PEN) SUBCUTANEOUSLY EVERY WEEK Patient taking differently: Inject 1.5 mg into the skin once a week. 10/05/20  Yes Nida, Marella Chimes, MD  insulin degludec (TRESIBA FLEXTOUCH) 100 UNIT/ML FlexTouch Pen Inject 20 Units into the skin daily. 08/25/20  Yes Nida, Marella Chimes, MD  KLOR-CON M20 20 MEQ tablet TAKE 1.5 TABLETS (30 MEQ TOTAL) BY MOUTH 2 (TWO) TIMES DAILY. Patient taking differently: Take 30 mEq by mouth 2 (two) times daily. 08/08/20  Yes Evans Lance, MD  loratadine (CLARITIN) 10 MG tablet TAKE 1 TABLET BY MOUTH EVERY DAY Patient taking differently: Take 10 mg by mouth daily. 10/03/20  Yes Bensimhon, Shaune Pascal, MD  metolazone (ZAROXOLYN) 2.5 MG tablet Take 1 tablet (2.5 mg total) by mouth as directed. For wight of 200 lb and greater 09/20/20  Yes Bensimhon, Shaune Pascal, MD  Multiple Vitamins-Minerals (ONE-A-DAY MENS 50+) TABS Take 1 tablet by mouth daily with breakfast.   Yes [provider]  spironolactone (ALDACTONE) 25 MG tablet Take 0.5 tablets (12.5 mg total) by mouth daily. 07/20/20  Yes Bensimhon, Shaune Pascal, MD  torsemide (DEMADEX) 20 MG tablet Take 3 tablets (60 mg total) by mouth 2 (two) times daily. 07/28/20  Yes Bensimhon, Shaune Pascal, MD  ACCU-CHEK AVIVA PLUS test strip TEST BLOOD SUGAR TWICE DAILY BEFORE BREAKFAST AND AT BEDTIME 07/19/20   Cassandria Anger, MD  Insulin Pen Needle (BD PEN NEEDLE NANO U/F) 32G X 4 MM MISC 1 each by Does not apply route 4 (four) times daily. 12/23/18   Cassandria Anger, MD    Allergies    Patient has no known allergies.  Review of Systems   Review of Systems  Constitutional:  Positive for fatigue. Negative for chills and fever.  HENT:  Negative for congestion, ear pain and sore throat.   Eyes:  Negative for pain and visual disturbance.  Respiratory:  Negative for  cough and shortness of breath.   Cardiovascular:  Negative for chest pain and palpitations.  Gastrointestinal:  Negative for abdominal pain and vomiting.  Genitourinary:  Negative for dysuria and hematuria.  Musculoskeletal:  Negative for arthralgias and back pain.  Skin:  Negative for color change and rash.  Neurological:  Positive for weakness. Negative for seizures and syncope.  All other systems reviewed and are negative.  Physical Exam Updated Vital Signs BP (!) 114/56   Pulse 65   Temp 98.1 F (36.7 C) (Oral)   Resp 17   Ht 1.829 m (6')   Wt 88.9 kg   SpO2 100%   BMI 26.58 kg/m   Physical Exam Vitals and nursing note reviewed.  Constitutional:      General: He is not in acute distress.    Appearance: Normal appearance. He is well-developed.  HENT:     Head: Normocephalic and atraumatic.     Mouth/Throat:     Mouth: Mucous membranes are moist.  Eyes:     Extraocular Movements: Extraocular movements intact.     Conjunctiva/sclera: Conjunctivae normal.     Pupils: Pupils are equal, round, and reactive to light.  Cardiovascular:     Rate and Rhythm: Normal rate. Rhythm irregular.     Heart sounds: No murmur heard. Pulmonary:     Effort: Pulmonary effort is normal. No respiratory distress.     Breath sounds: Normal breath sounds.  Abdominal:     Palpations: Abdomen is soft.     Tenderness: There is no abdominal tenderness.  Genitourinary:    Rectum: Guaiac result positive.     Comments: Melanotic stool.  Black in color heme positive. Musculoskeletal:     Cervical back: Normal range of motion and neck supple. No tenderness.     Right lower leg: Edema present.     Left lower leg: Edema present.     Comments: Chest trace bilateral edema  Skin:    General: Skin is warm and dry.  Neurological:     General: No focal deficit present.     Mental Status: He is alert and oriented to person, place, and time.    ED Results / Procedures / Treatments   Labs (all labs  ordered are listed, but only abnormal results are displayed) Labs Reviewed  BRAIN NATRIURETIC PEPTIDE - Abnormal; Notable for the following components:      Result Value   B Natriuretic Peptide 851.0 (*)    All other components within normal limits  COMPREHENSIVE METABOLIC PANEL - Abnormal; Notable for the following components:   Sodium 126 (*)    Chloride 92 (*)    Glucose, Bld 470 (*)    BUN 104 (*)    Creatinine, Ser 1.50 (*)    Calcium 8.2 (*)    Total Protein 5.8 (*)    Albumin 2.7 (*)    Alkaline Phosphatase 146 (*)    Total Bilirubin 1.4 (*)    GFR, Estimated 49 (*)    All other components within normal limits  CBC WITH DIFFERENTIAL/PLATELET - Abnormal; Notable for the following components:   RBC 2.20 (*)    Hemoglobin 7.3 (*)    HCT 21.6 (*)    RDW 17.0 (*)    Platelets 122 (*)    Abs Immature Granulocytes 0.10 (*)    All other components within normal limits  CBG MONITORING, ED - Abnormal; Notable for the following components:   Glucose-Capillary 428 (*)    All other components within normal limits  POC OCCULT BLOOD, ED - Abnormal; Notable for the following components:   Fecal Occult Bld POSITIVE (*)    All other components within normal limits  TROPONIN I (HIGH SENSITIVITY) -  Abnormal; Notable for the following components:   Troponin I (High Sensitivity) 58 (*)    All other components within normal limits  RESP PANEL BY RT-PCR (FLU A&B, COVID) [id number]  TYPE AND SCREEN    EKG EKG Interpretation  Date/Time:  Monday October 17 2020 08:10:26 EDT Ventricular Rate:  73 PR Interval:  131 QRS Duration: 191 QT Interval:  597 QTC Calculation: 572 R Axis:   264 Text Interpretation: VENTRICULAR PACED RHYTHM Supraventricular bigeminy Right bundle branch block Confirmed by Fredia Sorrow [phone]) on 10/17/2020 8:42:47 AM  Radiology DG Chest Port 1 View  Result Date: 10/17/2020 CLINICAL DATA:  Weakness, fall EXAM: PORTABLE CHEST 1 VIEW COMPARISON:  06/08/2020 FINDINGS:  Low lung volumes. No consolidation or edema. No pleural effusion or pneumothorax. Stable cardiomediastinal contours. Post TAVR. Left chest wall dual lead pacemaker. IMPRESSION: No acute process in the chest. Electronically Signed   By: Macy Mis M.D.   On: 10/17/2020 08:38    Procedures Procedures   CRITICAL CARE Performed by: Fredia Sorrow Total critical care time: 45 minutes Critical care time was exclusive of separately billable procedures and treating other patients. Critical care was necessary to treat or prevent imminent or life-threatening deterioration. Critical care was time spent personally by me on the following activities: development of treatment plan with patient and/or surrogate as well as nursing, discussions with consultants, evaluation of patient's response to treatment, examination of patient, obtaining history from patient or surrogate, ordering and performing treatments and interventions, ordering and review of laboratory studies, ordering and review of radiographic studies, pulse oximetry and re-evaluation of patient's condition.   Medications Ordered in ED Medications - No data to display  ED Course  I have reviewed the triage vital signs and the nursing notes.  Pertinent labs & imaging results that were available during my care of the patient were reviewed by me and considered in my medical decision making (see chart for details).    MDM Rules/Calculators/A&P                          Patient with significant anemia and change in his hemoglobin.  Probably secondary to the black tarry stools.  Patient is on Eliquis.  Not sure how long this has been ongoing.  Will discuss with cardiology due to his significant cardiac history and congestive heart failure about where they want him admitted.  If he needs admission here will discuss with gastroenterology Dr. Abbey Chatters.  Will be an internal medicine admission.  Patient also with hyponatremia and hyperglycemia.  Will  order blood transfusion.  Final Clinical Impression(s) / ED Diagnoses Final diagnoses:  Weakness  Gastrointestinal hemorrhage, unspecified gastrointestinal hemorrhage type  Anemia, unspecified type  Hyponatremia  Hyperglycemia  Chronic combined systolic and diastolic congestive heart failure [facility])    Rx / DC Orders ED Discharge Orders     None        Fredia Sorrow, MD 10/17/20 1050   Addendum:Discussed with Dorris Carnes from cardiology okay to admit here they will follow.  Discussed with Dr. Abbey Chatters from gastroenterology he will consult.  Will discuss with hospitalist for admission here.  Patient hemodynamically stable.  2 unit blood transfusion has been ordered.  Patient's blood sugars remained in the 400s.  Patient will receive some IV insulin 10 units.   Fredia Sorrow, MD 10/17/20 1245

## 2020-10-18 ENCOUNTER — Encounter (HOSPITAL_COMMUNITY)
Admission: EM | Disposition: A | Discharge status: Home Health | Payer: Self-pay | Source: Home / Self Care | Attending: Family Medicine

## 2020-10-18 ENCOUNTER — Encounter (HOSPITAL_COMMUNITY): Payer: Self-pay | Admitting: Family Medicine

## 2020-10-18 ENCOUNTER — Inpatient Hospital Stay (HOSPITAL_COMMUNITY): Payer: Medicare HMO | Admitting: Anesthesiology

## 2020-10-18 ENCOUNTER — Other Ambulatory Visit: Payer: Self-pay

## 2020-10-18 DIAGNOSIS — K3189 Other diseases of stomach and duodenum: Secondary | ICD-10-CM

## 2020-10-18 DIAGNOSIS — K921 Melena: Secondary | ICD-10-CM | POA: Diagnosis not present

## 2020-10-18 DIAGNOSIS — K264 Chronic or unspecified duodenal ulcer with hemorrhage: Principal | ICD-10-CM

## 2020-10-18 DIAGNOSIS — I5022 Chronic systolic (congestive) heart failure: Secondary | ICD-10-CM | POA: Diagnosis not present

## 2020-10-18 DIAGNOSIS — I4892 Unspecified atrial flutter: Secondary | ICD-10-CM

## 2020-10-18 HISTORY — PX: ESOPHAGOGASTRODUODENOSCOPY (EGD) WITH PROPOFOL: SHX5813

## 2020-10-18 LAB — TYPE AND SCREEN
ABO/RH(D): B POS
Antibody Screen: NEGATIVE
Unit division: 0
Unit division: 0

## 2020-10-18 LAB — CBC
HCT: 26.7 % — ABNORMAL LOW (ref 39.0–52.0)
Hemoglobin: 9.1 g/dL — ABNORMAL LOW (ref 13.0–17.0)
MCH: 32.7 pg (ref 26.0–34.0)
MCHC: 34.1 g/dL (ref 30.0–36.0)
MCV: 96 fL (ref 80.0–100.0)
Platelets: 128 10*3/uL — ABNORMAL LOW (ref 150–400)
RBC: 2.78 MIL/uL — ABNORMAL LOW (ref 4.22–5.81)
RDW: 18.6 % — ABNORMAL HIGH (ref 11.5–15.5)
WBC: 10.7 10*3/uL — ABNORMAL HIGH (ref 4.0–10.5)
nRBC: 0 % (ref 0.0–0.2)

## 2020-10-18 LAB — HEMOGLOBIN A1C
Hgb A1c MFr Bld: 8.2 % — ABNORMAL HIGH (ref 4.8–5.6)
Mean Plasma Glucose: 189 mg/dL

## 2020-10-18 LAB — BPAM RBC
Blood Product Expiration Date: 202208062359
Blood Product Expiration Date: 202208222359
ISSUE DATE / TIME: 202207251156
ISSUE DATE / TIME: 202207251834
Unit Type and Rh: 1700
Unit Type and Rh: 1700

## 2020-10-18 LAB — BASIC METABOLIC PANEL
Anion gap: 10 (ref 5–15)
BUN: 90 mg/dL — ABNORMAL HIGH (ref 8–23)
CO2: 26 mmol/L (ref 22–32)
Calcium: 8.5 mg/dL — ABNORMAL LOW (ref 8.9–10.3)
Chloride: 99 mmol/L (ref 98–111)
Creatinine, Ser: 1.39 mg/dL — ABNORMAL HIGH (ref 0.61–1.24)
GFR, Estimated: 53 mL/min — ABNORMAL LOW (ref >60)
Glucose, Bld: 95 mg/dL (ref 70–99)
Potassium: 3.9 mmol/L (ref 3.5–5.1)
Sodium: 135 mmol/L (ref 135–145)

## 2020-10-18 LAB — GLUCOSE, CAPILLARY
Glucose-Capillary: 113 mg/dL — ABNORMAL HIGH (ref 70–99)
Glucose-Capillary: 180 mg/dL — ABNORMAL HIGH (ref 70–99)
Glucose-Capillary: 178 mg/dL — ABNORMAL HIGH (ref 70–99)
Glucose-Capillary: 182 mg/dL — ABNORMAL HIGH (ref 70–99)
Glucose-Capillary: 185 mg/dL — ABNORMAL HIGH (ref 70–99)
Glucose-Capillary: 194 mg/dL — ABNORMAL HIGH (ref 70–99)

## 2020-10-18 LAB — PROTIME-INR
INR: 1.3 — ABNORMAL HIGH (ref 0.8–1.2)
Prothrombin Time: 15.8 seconds — ABNORMAL HIGH (ref 11.4–15.2)

## 2020-10-18 LAB — SURGICAL PCR SCREEN
MRSA, PCR: NEGATIVE
Staphylococcus aureus: NEGATIVE

## 2020-10-18 SURGERY — ESOPHAGOGASTRODUODENOSCOPY (EGD) WITH PROPOFOL
Anesthesia: General

## 2020-10-18 MED ORDER — LIDOCAINE HCL (CARDIAC) PF 100 MG/5ML IV SOSY
PREFILLED_SYRINGE | INTRAVENOUS | Status: DC | PRN
  Administered 2020-10-18: 80 mg via INTRAVENOUS

## 2020-10-18 MED ORDER — SODIUM CHLORIDE (PF) 0.9 % IJ SOLN
PREFILLED_SYRINGE | INTRAMUSCULAR | Status: DC | PRN
  Administered 2020-10-18: 2 mL

## 2020-10-18 MED ORDER — KETAMINE HCL 10 MG/ML IJ SOLN
INTRAMUSCULAR | Status: DC | PRN
  Administered 2020-10-18 (×2): 15 mg via INTRAVENOUS

## 2020-10-18 MED ORDER — PROPOFOL 10 MG/ML IV BOLUS
INTRAVENOUS | Status: DC | PRN
  Administered 2020-10-18: 80 mg via INTRAVENOUS
  Administered 2020-10-18 (×2): 40 mg via INTRAVENOUS

## 2020-10-18 MED ORDER — PANTOPRAZOLE INFUSION (NEW) - SIMPLE MED
8.0000 mg/h | INTRAVENOUS | Status: AC
  Administered 2020-10-18 – 2020-10-19 (×2): 8 mg/h via INTRAVENOUS
  Filled 2020-10-18 (×8): qty 100

## 2020-10-18 MED ORDER — PHENYLEPHRINE 40 MCG/ML (10ML) SYRINGE FOR IV PUSH (FOR BLOOD PRESSURE SUPPORT)
PREFILLED_SYRINGE | INTRAVENOUS | Status: AC
  Filled 2020-10-18: qty 10

## 2020-10-18 MED ORDER — EPINEPHRINE 1 MG/10ML IJ SOSY
PREFILLED_SYRINGE | INTRAMUSCULAR | Status: AC
  Filled 2020-10-18: qty 10

## 2020-10-18 MED ORDER — EPHEDRINE 5 MG/ML INJ
INTRAVENOUS | Status: AC
  Filled 2020-10-18: qty 5

## 2020-10-18 MED ORDER — KETAMINE HCL 50 MG/5ML IJ SOSY
PREFILLED_SYRINGE | INTRAMUSCULAR | Status: AC
  Filled 2020-10-18: qty 5

## 2020-10-18 MED ORDER — PROPOFOL 10 MG/ML IV BOLUS
INTRAVENOUS | Status: AC
  Filled 2020-10-18: qty 20

## 2020-10-18 MED ORDER — PHENYLEPHRINE 40 MCG/ML (10ML) SYRINGE FOR IV PUSH (FOR BLOOD PRESSURE SUPPORT)
PREFILLED_SYRINGE | INTRAVENOUS | Status: DC | PRN
  Administered 2020-10-18 (×3): 80 ug via INTRAVENOUS

## 2020-10-18 MED ORDER — LACTATED RINGERS IV SOLN: INTRAVENOUS | Status: DC

## 2020-10-18 MED ORDER — EPHEDRINE SULFATE-NACL 50-0.9 MG/10ML-% IV SOSY
PREFILLED_SYRINGE | INTRAVENOUS | Status: DC | PRN
  Administered 2020-10-18: 5 mg via INTRAVENOUS

## 2020-10-18 MED ORDER — SODIUM CHLORIDE 0.9 % IV SOLN: INTRAVENOUS | Status: DC

## 2020-10-18 NOTE — Plan of Care (Signed)
  Problem: Acute Rehab OT Goals (only OT should resolve) Goal: Pt. Will Perform Grooming Flowsheets (Taken 10/18/2020 0941) Pt Will Perform Grooming:  with modified independence  standing  with adaptive equipment Goal: Pt. Will Perform Lower Body Dressing Flowsheets (Taken 10/18/2020 0941) Pt Will Perform Lower Body Dressing:  with supervision  sitting/lateral leans  sit to/from stand  with adaptive equipment Goal: Pt. Will Transfer To Toilet Flowsheets (Taken 10/18/2020 0941) Pt Will Transfer to Toilet:  with modified independence  stand pivot transfer  bedside commode Goal: Pt/Caregiver Will Perform Home Exercise Program Flowsheets (Taken 10/18/2020 0941) Pt/caregiver will Perform Home Exercise Program:  Increased strength  Left upper extremity  Independently  Addilyne Backs OT, MOT

## 2020-10-18 NOTE — Progress Notes (Addendum)
After I finished esophagogastroduodenospy, I personally discussed with interventional radiologist on-call at [facility] the findings and the need to perform GDA embolization to prevent massive upper gastrointestinal bleeding.  It was agreed that the patient needed transfer to [facility] to undergo this intervention.  I discussed this as well with hospitalist at [facility] who will arrange transfer.

## 2020-10-18 NOTE — Plan of Care (Signed)
  Problem: Acute Rehab PT Goals(only PT should resolve) Goal: Pt Will Go Supine/Side To Sit Outcome: Progressing Flowsheets (Taken 10/18/2020 1106) Pt will go Supine/Side to Sit: with modified independence Goal: Patient Will Transfer Sit To/From Stand Outcome: Progressing Flowsheets (Taken 10/18/2020 1106) Patient will transfer sit to/from stand:  with supervision  with min guard assist Goal: Pt Will Transfer Bed To Chair/Chair To Bed Outcome: Progressing Flowsheets (Taken 10/18/2020 1106) Pt will Transfer Bed to Chair/Chair to Bed:  with supervision  min guard assist Goal: Pt Will Ambulate Outcome: Progressing Flowsheets (Taken 10/18/2020 1106) Pt will Ambulate:  > 125 feet  with supervision  with rolling walker   11:06 AM, 10/18/20 Lonell Grandchild, MPT Physical Therapist with [facility] [phone] office [phone] mobile phone

## 2020-10-18 NOTE — Transfer of Care (Signed)
Immediate Anesthesia Transfer of Care Note  Patient: SLY PARLEE  Procedure(s) Performed: ESOPHAGOGASTRODUODENOSCOPY (EGD) WITH PROPOFOL  Patient Location: PACU  Anesthesia Type:General  Level of Consciousness: awake and oriented  Airway & Oxygen Therapy: Patient Spontanous Breathing and Patient connected to nasal cannula oxygen  Post-op Assessment: Report given to RN and Post -op Vital signs reviewed and stable  Post vital signs: Reviewed and stable  Last Vitals:  Vitals Value Taken Time  BP    Temp    Pulse 59 10/18/20 1602  Resp 16 10/18/20 1602  SpO2 100 % 10/18/20 1602  Vitals shown include unvalidated device data.  Last Pain:  Vitals:   10/18/20 1530  TempSrc:   PainSc: 0-No pain         Complications: No notable events documented.

## 2020-10-18 NOTE — Progress Notes (Signed)
History and Physical   Patient: John [name]                            PCP: Erven Colla, DO                    DOB: 27-Mar-1945            DOA: 10/17/2020 YIF:[id number]             DOS: 10/18/2020, 4:33 PM  Patient coming from:   HOME  I have personally reviewed patient's medical records, in electronic medical records, including:  [facility] link, and care everywhere.   PCP: Erven Colla, DO   Chief complaint; weakness  Subjective:   The patient was seen and examined this morning, stable no acute distress reporting no active bleeding.  Denies any shortness of breath or chest pain. Tolerated blood transfusion well overnight.   Anticipating EGD today 10/18/2020   History of present illness:    John [name] is a 75 y.o. male with medical history significant of   combined systolic/diastolic congestive heart failure IIIB, A. fib/flutter - w pacemaker on Eliquis, ICM,-cath September 2021, severe aortic valve stenosis -post TAVR February 2022, HTN, HLD, DMII, gout, CKD IIIb...Marland KitchenMarland Kitchen Presented with generalized weaknesses, fall 3 days ago without any injuries patient denies hitting his head or sustaining any physical trauma.   EMS noted orthostatic hypotensive   Patient Denies having: Fever, Chills, Cough, SOB, Chest Pain, Abd pain, N/V/D, headache, dizziness, lightheadedness,  Dysuria, Joint pain, rash, open wounds  ED Course:  Blood pressure 131/65, pulse 82, temperature 97.7 F (36.5 C), temperature source Oral, resp. rate 20, height 6' (1.829 m), weight 88.9 kg, SpO2 100 %..vital Abnormal labs; CBC WBC 9.9, hemoglobin 7.3, hematocrit 21.6, GFR 49, sodium 126, potassium 3.9, BUN 104, creatinine 1.5, glucose 470, Hemoccult positive x2 Influenza A/B, SARS-CoV-2-negative    Assessment / Plan:   Principal Problem:   GIB (gastrointestinal bleeding) Active Problems:   DM type 2 causing vascular disease (HCC)   Mixed hyperlipidemia   Essential hypertension, benign    Atrial flutter (HCC)   Current use of long term anticoagulation   CKD (chronic kidney disease) stage 3, GFR 30-59 ml/min (HCC)   Hyponatremia   Thrombocytopenia (HCC)   Permanent atrial fibrillation (HCC)   S/P TAVR (transcatheter aortic valve replacement)   Polyarticular gout   Anemia  Principal Problem:   GIB (gastrointestinal bleeding) -Hemodynamically stable -In the face of chronic anticoagulation with Eliquis -Hemoccult positive, hemoglobin is 7.3 (hemoglobin was 14.6 on 09/12/2020) >> 9.1 -GI following anticipating EGD today  -Withholding chronic anticoagulation Eliquis, -Monitoring vitals closely...  -Initiating IV Protonix -Status post 2 units of PRBC transfusion 10/17/2020   Addendum status post EGD 10/18/2020... By GI One non-bleeding cratered duodenal ulcer with a nonbleeding visible vessel (Forrest Class IIa) was found in the duodenal bulb - likely the GDA.  The lesion was 20 mm in largest dimension.  Area was successfully injected with 2 mL of a 1:10,000 solution of epinephrine for hemostasis.  For hemostasis, two hemostatic clips were attempted to be placed. Unfortunately, the edges of the ulcer were too fibrotic and friable so no adequate grasping could be achieved. One clip was deployed in proxity to the ulcer to attempt to "narrow the gap", but this did not help. A second clip was used but not deployed.  There was no bleeding at the end of  the procedure.    RECOMMENDATIONS: -Main NPO. - PPI drip. - IR consultation for GDA embolization....Marland Kitchen  - Avoid NSAIDs, including topical Voltaren - Check H. Pylori serology.  Discussed the finding with a gastroenterologist Dr. Jenetta Downer, and on-call interventional radiologist Agreed for patient to be transferred to [facility] for interventional radiologist to attempt embolization.   Please notify interventional radiologist on upon arrival Medically stable for transfer to [facility]     Acute on chronic anemia/anemia of chronic  disease, exacerbated by current GI bleed -Hemoglobin 7.3, hematocrit 21.6. >>> hemoglobin 9.1 Hemoccult positive -2 units of PRBC transfusion  -Tolerated well, -Not complaining of shortness of breath or chest pain   History of systolic/diastolic congestive heart failure With holding diuretics due to hypotension, hyponatremia, elevated BUN/creatinine -Monitoring daily weight and I's and O's -Stable on room air  -Due to elevated BUN/creatinine, hyponatremia -we will withhold Zaroxolyn, Aldactone, and Demadex for next 24 hours -We will monitor for any volume overload or shortness of breath utilize as needed Lasix  History of severe aortic stenosis-status post TAVR -closely, asymptomatic  Active Problems:  DM type II -hyperglycemia -Home medication of Tyler Aas and Trulicity will be held, [place] q. Weekly taken at) -Checking CBG QA CHS, SSI coverage -Monitoring CBGs stable   Mixed hyperlipidemia -continue statin when tolerating p.o., stable    Essential hypertension, benign -monitoring BP closely, continue Coreg, will continue Aldactone in a.m.  Dysrhythmia-atrial A. fib/flutter/bradycardia - currently paced on a pacemaker   Current use of long term anticoagulation -due to A. fib on Eliquis (holding due to GI bleed) -Cardiology consulted for clearance for procedure.  CKD (chronic kidney disease) stage 3, GFR 30-59 ml/min (HCC)  -BUN 104, creatinine 1.50 >> 1.39  monitoring, avoiding nephrotoxins, gentle IV fluid hydration -BUN/creatinine improving,  Hyponatremia -acute on chronic, monitoring, with holding diuretics, sodium 135,  Thrombocytopenia (HCC) -monitoring closely, will transfuse as needed     Polyarticular gout -Zoom allopurinol, colchicine as needed when tolerating p.o.  Generalized weaknesses/status post fall -Consulting PT OT for evaluation recommendations  Cultures:  -none Antimicrobial: -none  Consults called:  GI/cardiologist -------------------------------------------------------------------------------------------------------------------------------------------- DVT prophylaxis: SCD/Compression stockings Code Status:   Code Status: Full Code   Admission status: Patient will be admitted as Inpatient, with a greater than 2 midnight length of stay. Level of care: Med-Surg   Family Communication:  none at bedside  (The above findings and plan of care has been discussed with patient in detail, the patient expressed understanding and agreement of above plan)  --------------------------------------------------------------------------------------------------------------------------------------------------  Disposition Plan:  Anticipated 1-2 days Status is: Inpatient  Remains inpatient appropriate because:Inpatient level of care appropriate due to severity of illness  Dispo: The patient is from: Home              Anticipated d/c is to: Home              Patient currently is not medically stable to d/c.   Difficult to place patient No    Review of Systems: As per HPI, otherwise 10 point review of systems were negative.   ----------------------------------------------------------------------------------------------------------------------  No Known Allergies  Home MEDs:  Prior to Admission medications   Medication Sig Start Date End Date Taking? Authorizing Provider  acetaminophen (TYLENOL) 325 MG tablet Take 2 tablets (650 mg total) by mouth every 4 (four) hours as needed for headache or mild pain. 05/15/20  Yes Isaiah Serge, NP  allopurinol (ZYLOPRIM) 300 MG tablet Take 0.5 tablets (150 mg total) by mouth daily. 07/20/20  Yes Bensimhon, Shaune Pascal, MD  apixaban (ELIQUIS) 5 MG TABS tablet Take 1 tablet (5 mg total) by mouth 2 (two) times daily. 05/24/20  Yes Verta Ellen., NP  aspirin 81 MG chewable tablet Chew 1 tablet (81 mg total) by mouth daily. 05/16/20  Yes Isaiah Serge, NP   benzonatate (TESSALON) 100 MG capsule Take 1 capsule (100 mg total) by mouth 3 (three) times daily as needed for cough. 09/27/20  Yes Evans Lance, MD  carvedilol (COREG) 3.125 MG tablet TAKE 1 TABLET IN THE MORNING AND 2 TABLETS IN THE EVENING Patient taking differently: Take 3.125-6.25 mg by mouth 2 (two) times daily with a meal. 1 tablet in the morning and 2 tablets in the evening 07/20/20  Yes Bensimhon, Shaune Pascal, MD  colchicine 0.6 MG tablet Take 1 tablet (0.6 mg total) by mouth daily as needed. Patient taking differently: Take 0.6 mg by mouth daily as needed (gout). 05/15/20  Yes Isaiah Serge, NP  Dulaglutide (TRULICITY) 1.5 WC/5.8NI SOPN INJECT 1.5MG (1 PEN) SUBCUTANEOUSLY EVERY WEEK Patient taking differently: Inject 1.5 mg into the skin once a week. 10/05/20  Yes Nida, Marella Chimes, MD  insulin degludec (TRESIBA FLEXTOUCH) 100 UNIT/ML FlexTouch Pen Inject 20 Units into the skin daily. 08/25/20  Yes Nida, Marella Chimes, MD  KLOR-CON M20 20 MEQ tablet TAKE 1.5 TABLETS (30 MEQ TOTAL) BY MOUTH 2 (TWO) TIMES DAILY. Patient taking differently: Take 30 mEq by mouth 2 (two) times daily. 08/08/20  Yes Evans Lance, MD  loratadine (CLARITIN) 10 MG tablet TAKE 1 TABLET BY MOUTH EVERY DAY Patient taking differently: Take 10 mg by mouth daily. 10/03/20  Yes Bensimhon, Shaune Pascal, MD  metolazone (ZAROXOLYN) 2.5 MG tablet Take 1 tablet (2.5 mg total) by mouth as directed. For wight of 200 lb and greater 09/20/20  Yes Bensimhon, Shaune Pascal, MD  Multiple Vitamins-Minerals (ONE-A-DAY MENS 50+) TABS Take 1 tablet by mouth daily with breakfast.   Yes [provider]  spironolactone (ALDACTONE) 25 MG tablet Take 0.5 tablets (12.5 mg total) by mouth daily. 07/20/20  Yes Bensimhon, Shaune Pascal, MD  torsemide (DEMADEX) 20 MG tablet Take 3 tablets (60 mg total) by mouth 2 (two) times daily. 07/28/20  Yes Bensimhon, Shaune Pascal, MD  ACCU-CHEK AVIVA PLUS test strip TEST BLOOD SUGAR TWICE DAILY BEFORE BREAKFAST AND  AT BEDTIME 07/19/20   Cassandria Anger, MD  Insulin Pen Needle (BD PEN NEEDLE NANO U/F) 32G X 4 MM MISC 1 each by Does not apply route 4 (four) times daily. 12/23/18   Cassandria Anger, MD    PRN MEDs: Doug Sou Hold] sodium chloride, [MAR Hold] acetaminophen **OR** [MAR Hold] acetaminophen, [MAR Hold] bisacodyl, [MAR Hold]  HYDROmorphone (DILAUDID) injection, [MAR Hold] ipratropium, [MAR Hold] levalbuterol, [MAR Hold] ondansetron **OR** [MAR Hold] ondansetron (ZOFRAN) IV, [MAR Hold] oxyCODONE, [MAR Hold] senna-docusate, [MAR Hold] sodium chloride flush, [MAR Hold] traZODone reports that he quit smoking about 34 years ago. His smoking use included cigarettes. He has a 10.00 pack-year smoking history. He has never used smokeless tobacco. He reports previous alcohol use. He reports that he does not use drugs.   Family History  Problem Relation Age of Onset   Heart attack Mother    Hypertension Mother    Heart attack Father    Hypertension Father    Heart attack Brother    Colon cancer Neg Hx     Physical Exam:   Vitals:   10/18/20 1400 10/18/20 1500 10/18/20 1600  10/18/20 1615  BP: 122/60 (!) 123/57 (!) 113/53 109/64  Pulse: (!) 59 (!) 58 60 (!) 58  Resp: (!) 9 12 16 17   Temp:   (!) 97.4 F (36.3 C)   TempSrc:      SpO2: 98% 100% 100% 100%  Weight:      Height:          Physical Exam:   General:  Alert, oriented, cooperative, no distress;   HEENT:  Normocephalic, PERRL, otherwise with in Normal limits   Neuro:  CNII-XII intact. , normal motor and sensation, reflexes intact   Lungs:   Clear to auscultation BL, Respirations unlabored, no wheezes / crackles  Cardio:    S1/S2, RRR, No murmure, No Rubs or Gallops   Abdomen:   Soft, non-tender, bowel sounds active all four quadrants,  no guarding or peritoneal signs.  Muscular skeletal:  Limited exam - in bed, able to move all 4 extremities, Normal strength,  2+ pulses,  symmetric, No pitting edema  Skin:  Dry, warm to touch,  negative for any Rashes,  Wounds: Please see nursing documentation             Labs on admission:    I have personally reviewed following labs and imaging studies  CBC: Recent Labs  Lab 10/17/20 0812 10/18/20 0431  WBC 9.9 10.7*  NEUTROABS 7.3  --   HGB 7.3* 9.1*  HCT 21.6* 26.7*  MCV 98.2 96.0  PLT 122* 229*   Basic Metabolic Panel: Recent Labs  Lab 10/17/20 0812 10/18/20 0431  NA 126* 135  K 3.9 3.9  CL 92* 99  CO2 25 26  GLUCOSE 470* 95  BUN 104* 90*  CREATININE 1.50* 1.39*  CALCIUM 8.2* 8.5*   GFR: Estimated Creatinine Clearance: 51.2 mL/min (A) (by C-G formula based on SCr of 1.39 mg/dL (H)). Liver Function Tests: Recent Labs  Lab 10/17/20 0812  AST 34  ALT 20  ALKPHOS 146*  BILITOT 1.4*  PROT 5.8*  ALBUMIN 2.7*   No results for input(s): LIPASE, AMYLASE in the last 168 hours. No results for input(s): AMMONIA in the last 168 hours. Coagulation Profile: Recent Labs  Lab 10/17/20 0812 10/18/20 0431  INR 1.5* 1.3*   Cardiac Enzymes: No results for input(s): CKTOTAL, CKMB, CKMBINDEX, TROPONINI in the last 168 hours. BNP (last 3 results) No results for input(s): PROBNP in the last 8760 hours. HbA1C: Recent Labs    10/17/20 1800  HGBA1C 8.2*   CBG: Recent Labs  Lab 10/18/20 0726 10/18/20 1108 10/18/20 1337 10/18/20 1600 10/18/20 1626  GLUCAP 113* 180* 178* 185* 182*   Lipid Profile: No results for input(s): CHOL, HDL, LDLCALC, TRIG, CHOLHDL, LDLDIRECT in the last 72 hours. Thyroid Function Tests: No results for input(s): TSH, T4TOTAL, FREET4, T3FREE, THYROIDAB in the last 72 hours. Anemia Panel: No results for input(s): VITAMINB12, FOLATE, FERRITIN, TIBC, IRON, RETICCTPCT in the last 72 hours. Urine analysis:    Component Value Date/Time   COLORURINE YELLOW 05/02/2020 [place] 05/02/2020 1449   LABSPEC 1.005 05/02/2020 1449   PHURINE 7.0 05/02/2020 1449   GLUCOSEU >=500 (A) 05/02/2020 1449   HGBUR  NEGATIVE 05/02/2020 1449   BILIRUBINUR NEGATIVE 05/02/2020 1449   KETONESUR NEGATIVE 05/02/2020 1449   PROTEINUR NEGATIVE 05/02/2020 1449   UROBILINOGEN 0.2 01/05/2014 0744   NITRITE NEGATIVE 05/02/2020 1449   LEUKOCYTESUR NEGATIVE 05/02/2020 1449     Radiologic Exams on Admission:   DG Chest Port 1 View  Result Date:  10/17/2020 CLINICAL DATA:  Weakness, fall EXAM: PORTABLE CHEST 1 VIEW COMPARISON:  06/08/2020 FINDINGS: Low lung volumes. No consolidation or edema. No pleural effusion or pneumothorax. Stable cardiomediastinal contours. Post TAVR. Left chest wall dual lead pacemaker. IMPRESSION: No acute process in the chest. Electronically Signed   By: Macy Mis M.D.   On: 10/17/2020 08:38    EKG:   Independently reviewed.  Orders placed or performed during the hospital encounter of 10/17/20   ED EKG   ED EKG   EKG 12-Lead   EKG 12-Lead   EKG 12-Lead   EKG   ---------------------------------------------------------------------------------------------------------------------------------------    -------------------------------------------------------------------------------------------------------------------------------------------------  Time spent: > than  66  Min.   SIGNED: [name], MD, FHM. [facility],  Pager (Please use [email or web address] to page to text)  If 7PM-7AM, please contact night-coverage [email or web address],  10/18/2020, 4:33 PM

## 2020-10-18 NOTE — TOC Initial Note (Addendum)
Transition of Care [facility]) - Initial/Assessment Note    Patient Details  Name: John [name] MRN: [id number] Date of Birth: 12/31/1945  [facility]) CM/SW Contact:    Salome Arnt, LCSW Phone Number: 10/18/2020, 9:19 AM  Clinical Narrative:  Pt admitted with gastrointestinal bleeding. TOC completed assessment due to high risk readmission score and referral for PCP needs/DME/home health. Assessment completed with pt's wife. She reports pt is fairly independent at baseline. She assists with bathing. Pt has a cane, walker, and 3N1 at home. Wife plans on pt returning home when medically stable. She does not anticipate any needs at this time. PT evaluation pending- LCSW will follow up based on recommendations. Pt's PCP is Dr. Lovena Le.                 Update: PT recommending home health. Discussed with pt's wife who requests [facility]. Referral made to [facility] who accepts. MD notified HHPT order needed.   Expected Discharge Plan: Home/Self Care Barriers to Discharge: Continued Medical Work up   Patient Goals and CMS Choice Patient states their goals for this hospitalization and ongoing recovery are:: return home      Expected Discharge Plan and Services Expected Discharge Plan: Home/Self Care In-house Referral: Clinical Social Work     Living arrangements for the past 2 months: Single Family Home                 DME Arranged: N/A DME Agency: NA                  Prior Living Arrangements/Services Living arrangements for the past 2 months: [place]   Patient language and need for interpreter reviewed:: Yes Do you feel safe going back to the place where you live?: Yes      Need for Family Participation in Patient Care: No (Comment)     Criminal Activity/Legal Involvement Pertinent to Current Situation/Hospitalization: No - Comment as needed  Activities of Daily Living Home Assistive Devices/Equipment: Environmental consultant (specify type), Cane (specify quad or  straight), CBG Meter, Grab bars in shower, Eyeglasses ADL Screening (condition at time of admission) Patient's cognitive ability adequate to safely complete daily activities?: Yes Is the patient deaf or have difficulty hearing?: No Does the patient have difficulty seeing, even when wearing glasses/contacts?: No Does the patient have difficulty concentrating, remembering, or making decisions?: No Patient able to express need for assistance with ADLs?: Yes Does the patient have difficulty dressing or bathing?: No Independently performs ADLs?: Yes (appropriate for developmental age) Does the patient have difficulty walking or climbing stairs?: Yes Weakness of Legs: Both Weakness of Arms/Hands: Both  Permission Sought/Granted                  Emotional Assessment   Attitude/Demeanor/Rapport: Engaged Affect (typically observed): Accepting Orientation: : Oriented to Self, Oriented to Place, Oriented to  Time, Oriented to Situation Alcohol / Substance Use: Not Applicable Psych Involvement: No (comment)  Admission diagnosis:  Hyponatremia [E87.1] Weakness [R53.1] GIB (gastrointestinal bleeding) [K92.2] Hyperglycemia [R73.9] Chronic combined systolic and diastolic congestive heart failure (HCC) [I50.42] Gastrointestinal hemorrhage, unspecified gastrointestinal hemorrhage type [K92.2] Anemia, unspecified type [D64.9] Patient Active Problem List   Diagnosis Date Noted   GIB (gastrointestinal bleeding) 10/17/2020   Anemia    Hematoma, chest wall 05/15/2020   Polyarticular gout 05/15/2020   S/P TAVR (transcatheter aortic valve replacement) 05/03/2020   Severe aortic stenosis 04/25/2020   Chronic atrial fibrillation ([place]) 04/25/2020   Acute on  chronic systolic CHF (congestive heart failure) ([place]) 04/25/2020   Permanent atrial fibrillation ([place]) 03/10/2020   Calculus of gallbladder without cholecystitis without obstruction    Thrombocytopenia ([place]) 03/09/2020   Hypoalbuminemia  03/09/2020   Transaminitis 03/09/2020   NASH (nonalcoholic steatohepatitis) 03/03/2020   Elevated LFTs 03/03/2020   Lower leg edema 10/14/2019   Class 2 obesity    Hyponatremia 11/11/2018   CKD (chronic kidney disease) stage 3, GFR 30-59 ml/min (HCC)    Paroxysmal atrial fibrillation ([place])    Pacemaker 03/02/2018   Atrial pacemaker lead displacement 12/07/2015   NSVT (nonsustained ventricular tachycardia) ([place]) 08/28/2015   Bradycardia 08/25/2015   Current use of long term anticoagulation 05/04/2011   Atrial flutter ([place]) 01/05/2011   DM type 2 causing vascular disease ([place]) 12/23/2009   Mixed hyperlipidemia 12/23/2009   Essential hypertension, benign 12/23/2009   PCP:  Erven Colla, DO Pharmacy:   [id number] [place] [address]Phone: [phone]: [phone] [facility] ([facility] [place] [address]Phone: [phone]: [phone]    Social Determinants of Health (SDOH) Interventions    Readmission Risk Interventions Readmission Risk Prevention Plan 10/18/2020 11/14/2018  Transportation Screening Complete Complete  PCP or Specialist Appt within 3-5 Days - Complete  HRI or [place]- Complete  Social Work Consult for [place]ok[place]anning/Counseling - Complete  Palliative Care Screening - Not Applicable  Medication Review (Press photographer Complete Complete  HRI or Home Care Consult Complete -  SW Recovery Care/Counseling Consult Complete -  Palliative Care Screening Not Applicable -  SRobertsNot Applicable -  Some recent data might be hidden

## 2020-10-18 NOTE — Progress Notes (Signed)
We will proceed with EGD as scheduled.  I thoroughly discussed with the patient his procedure, including the risks involved. Patient understands what the procedure involves including the benefits and any risks. Patient understands alternatives to the proposed procedure. Risks including (but not limited to) bleeding, tearing of the lining (perforation), rupture of adjacent organs, problems with heart and lung function, infection, and medication reactions. A small percentage of complications may require surgery, hospitalization, repeat endoscopic procedure, and/or transfusion.  Patient understood and agreed.  John Pruiett Castaneda, MD Gastroenterology and Hepatology [facility]  

## 2020-10-18 NOTE — Progress Notes (Deleted)
Patient seen and discussed with PA Dunn, I agree with her documentation. 75 yo male history of chronic systolic HF 0/0174 echo LVEF 25-30% with NICM, aortic stenosis s/p TAVR, complete heart block with pacemaker, HTN, DM2, afib/aflutter, CKD 3 presented with genralized weakness and a fall at home. From ER notes EMS found him to be orthostatic.      From 07/29/2019 HF clinic note home weight 194 lbs     Na 126 BUN 104 Cr 1.5 WBC 9.9 Hgb 7.3 (down from 14.6 last month) Plt 122        BNP 851 (priors 1600-2400) FOBT +  Trop 58-->57-->62-->61 COVID neg CXR no acute process EKG afib, vpacing  05/2020 echo: LVEF 94-49%, indet diastolic,  mild RV dysfunction, normal TAVR valve    Patient presents with symptomatic anemia, Hgb down from 14.6 to 7.3 on admission. We are asked to assess cardiology clearance for GI endoscopy.    From HF standpoint baseline weight appears to be between 195-200 lbs, admitted at 195 lbs. BNP lower than priors, CXR was clear. Likely hypovolemic on admission in setting of blood loss, reportedly orthostatic by EMS. Diuretics held on admission, may restart oral torsemide back tomorrow, hold any metolazone. Continue  his home coreg, aldactone. Had not been ACE/ARB/ARNI assuming due to renal dysfunction. Monitor volume status with transfusions   Regarding his hstiroy of afib/aflutter, continue rate control with coreg. Off eliquis for now given GI bleed.  History of CHB with PPM, normal device check 10/13/20. No inpatient needs.    Preop evaluation His chronic cardiac conditions are stable, recommend proceeding with GI procedures as needed.   We will sign off inpatient care, call us back if needed this admission.   [name] MD

## 2020-10-18 NOTE — Consult Note (Addendum)
Cardiology Consultation:   Patient ID: OMAURI BOEVE MRN: [id number]; DOB: May 08, 1945  Admit date: 10/17/2020 Date of Consult: 10/18/2020  PCP:  Erven Colla, [place] Prov[facility] Cardiologist:  [name], MD  Electrophysiologist:  [name], MD  Advanced Heart Failure:  Glori Bickers, MD       Patient Profile:   John [name] is a 75 y.o. male with a complex history of chronic systolic CHF due to NICM (cath 11/2019 without CAD), CHB s/p PPM 2017, HTN, DM2, atrial flutter s/p remote ablation, permanent atrial fib by last device interrogations, CKD (3a most recently) severe AS s/p TAVR 04/2020, moderate mitral regurgitation, possible cirrhosis, HLD, DM who is being seen 10/18/2020 for the evaluation of CHF/procedural clearance at the request of Dr. Roger Shelter.  History of Present Illness:   Mr. Osmun has a previously history as outlined above. His EF was previously known to be normal in 08/2015 at the time he required pacemaker placement. He has had progressive LV dysfunction over the last several years - [facility] 9/21 showed No CAD, + moderately elevated filling pressures and moderately reduced CO, CI 1.24malso concern for RV pacing CM with 42% RV pacing. His valvular disease has also been followed - in 04/2020 he was admitted for severe CHF and required dobutamine support and diuresis. He subsequently underwent TAVR 05/03/20. In 05/2020 Dr. TLovena Leattempted CRT upgrade which was unsuccessful due to occluded L subclavian vein. His last echo 06/23/20 showed EF 25-30%, global HK, mildly dilated LV, mildly reduced RV function, moderately elevated PASP, moderate biatrial enlargement, moderate MR, normal TAVR, dilated IVC. MR was felt functional from dilated CM. FWilder Gladewas previously discontinued due to tinea infection. Benazepril was previously stopped during 2020 admission due to elevated creatinine. BP has otherwise limited aggressive med titration. He has also followed with GI  for NASH/possible cirrhosis. He has been on ASA + Eliquis.  He has been doing reasonably well from a cardiac standpoint the last few weeks. He has only had to take metolazone sparingly for weight >200lb, otherwise typically does not tend to get SOB or swelling and compliant with meds. However, the last few days he has felt "wobbly." Over the weekend he was standing up to urinate and had a fall due to unsteadiness. Denies LOC or head injury. Due to persistent weakness yesterday, EMS was called and he was found to be orthostatic and given IV fluids. He was found to have new nomocytic anemia Hgb 7.3 and thrombocytopenia of 122 (prev Hgb 14 range and mildly decreased platelets in 08/2020). FOBT +. CXR NAD. He has been treated with blood transfusion with improvement to 9.1 this AM. Labs also demonstrated severe hyperglycemia of 470 with pseudohyponatremia, Cr 1.5-1.39, hypoalbuminemia of 2.7, BNP 851, mildly elevated troponin/low flat in the 50s-60s. GI planning scope and requests cardiac clearance. He is written to continue home carvedilol, but home torsemide and spironolactone are deferred until tomorrow. He denies any recent CP, SOB, syncope, palpitations, or known s/sx overt GI bleeding.    Past Medical History:  Diagnosis Date   Atrial flutter ([place] 12/2010   Admitted with symptomatic bradycardia (HR 40s), atrial flutter with slow ventricular response 12/2010 + volume overload; AV nodal agents d/c'd and Pradaxa started; RFA in 01/2011   Chronic kidney disease, stage 3b (HCC)    Chronic systolic CHF (congestive heart failure) (HCC)    Class 2 severe obesity due to excess calories with serious comorbidity and body mass index (BMI) of 37.0  to 37.9 in adult [facility]) 07/17/2017   Diabetes mellitus    non insulin dependant   Hematoma, chest wall 05/15/2020   Hyperlipidemia    Hypertension    Mitral regurgitation    NASH (nonalcoholic steatohepatitis)    NICM (nonischemic cardiomyopathy) (HCC)     Osteoarthritis    Polyarticular gout 05/15/2020   Presence of permanent cardiac pacemaker    PSVT (paroxysmal supraventricular tachycardia) (HCC)    Possibly atrial flutter   S/P TAVR (transcatheter aortic valve replacement) 05/03/2020   s/p TAVR with a 29 mm Edwards S3U via the subclavian approach with Dr. Angelena Form & Dr. Cyndia Bent    Severe aortic stenosis     Past Surgical History:  Procedure Laterality Date   ATRIAL FLUTTER ABLATION N/A 02/07/2011   Procedure: ATRIAL FLUTTER ABLATION;  Surgeon: Evans Lance, MD;  Location: [facility] CATH LAB;  Service: Cardiovascular;  Laterality: N/A;   BIV UPGRADE N/A 06/08/2020   Procedure: BIV PPM UPGRADE;  Surgeon: Evans Lance, MD;  Location: [place] CV LAB;  Service: Cardiovascular;  Laterality: N/A;   CARDIAC ELECTROPHYSIOLOGY STUDY AND ABLATION  02/07/11   CARDIOVERSION N/A 03/23/2016   Procedure: CARDIOVERSION;  Surgeon: Evans Lance, MD;  Location: [place];  Service: Cardiovascular;  Laterality: N/A;   COLONOSCOPY N/A 04/17/2017   Procedure: COLONOSCOPY;  Surgeon: Rogene Houston, MD;  [facility];  Service: Endoscopy;  Laterality: N/A;  [place] N/A 08/29/2015   Procedure: Pacemaker Implant;  Surgeon: Evans Lance, MD;  Location: [place] CV LAB;  Service: Cardiovascular;  Laterality: N/A;   EP IMPLANTABLE DEVICE N/A 12/07/2015   Procedure: PPM Lead Revision/Repair;  Surgeon: Evans Lance, MD;  Location: [place] CV LAB;  Service: Cardiovascular;  Laterality: N/A;   KNEE ARTHROSCOPY  2005   left   LUMBAR SPINE SURGERY     "I've had 6 ORs 1972 thru 2004"   POLYPECTOMY  04/17/2017   Procedure: POLYPECTOMY;  Surgeon: Rogene Houston, MD;  [facility];  Service: Endoscopy;;  transverse colon x3;   RIGHT HEART CATH N/A 04/28/2020   Procedure: RIGHT HEART CATH;  Surgeon: Jolaine Artist, MD;  Location: [place] CV LAB;  Service: Cardiovascular;  Laterality: N/A;   RIGHT/LEFT HEART  CATH AND CORONARY ANGIOGRAPHY N/A 12/21/2019   Procedure: RIGHT/LEFT HEART CATH AND CORONARY ANGIOGRAPHY;  Surgeon: Nelva Bush, MD;  Location: [place] CV LAB;  Service: Cardiovascular;  Laterality: N/A;   TEE WITHOUT CARDIOVERSION N/A 11/05/2019   Procedure: TRANSESOPHAGEAL ECHOCARDIOGRAM (TEE) WITH PROPOFOL;  Surgeon: Arnoldo Lenis, MD;  [facility];  Service: Endoscopy;  Laterality: N/A;   TEE WITHOUT CARDIOVERSION N/A 05/03/2020   Procedure: TRANSESOPHAGEAL ECHOCARDIOGRAM (TEE);  Surgeon: Burnell Blanks, MD;  Location: [place];  Service: Open Heart Surgery;  Laterality: N/A;     Home Medications:  Prior to Admission medications   Medication Sig Start Date End Date Taking? Authorizing Provider  acetaminophen (TYLENOL) 325 MG tablet Take 2 tablets (650 mg total) by mouth every 4 (four) hours as needed for headache or mild pain. 05/15/20  Yes Isaiah Serge, NP  allopurinol (ZYLOPRIM) 300 MG tablet Take 0.5 tablets (150 mg total) by mouth daily. 07/20/20  Yes Bensimhon, Shaune Pascal, MD  apixaban (ELIQUIS) 5 MG TABS tablet Take 1 tablet (5 mg total) by mouth 2 (two) times daily. 05/24/20  Yes Verta Ellen., NP  aspirin 81 MG chewable tablet  Chew 1 tablet (81 mg total) by mouth daily. 05/16/20  Yes Isaiah Serge, NP  benzonatate (TESSALON) 100 MG capsule Take 1 capsule (100 mg total) by mouth 3 (three) times daily as needed for cough. 09/27/20  Yes Evans Lance, MD  carvedilol (COREG) 3.125 MG tablet TAKE 1 TABLET IN THE MORNING AND 2 TABLETS IN THE EVENING Patient taking differently: Take 3.125-6.25 mg by mouth 2 (two) times daily with a meal. 1 tablet in the morning and 2 tablets in the evening 07/20/20  Yes Bensimhon, Shaune Pascal, MD  colchicine 0.6 MG tablet Take 1 tablet (0.6 mg total) by mouth daily as needed. Patient taking differently: Take 0.6 mg by mouth daily as needed (gout). 05/15/20  Yes Isaiah Serge, NP  Dulaglutide (TRULICITY) 1.5 YW/7.3XT SOPN INJECT  1.5MG (1 PEN) SUBCUTANEOUSLY EVERY WEEK Patient taking differently: Inject 1.5 mg into the skin once a week. 10/05/20  Yes Nida, Marella Chimes, MD  insulin degludec (TRESIBA FLEXTOUCH) 100 UNIT/ML FlexTouch Pen Inject 20 Units into the skin daily. 08/25/20  Yes Nida, Marella Chimes, MD  KLOR-CON M20 20 MEQ tablet TAKE 1.5 TABLETS (30 MEQ TOTAL) BY MOUTH 2 (TWO) TIMES DAILY. Patient taking differently: Take 30 mEq by mouth 2 (two) times daily. 08/08/20  Yes Evans Lance, MD  loratadine (CLARITIN) 10 MG tablet TAKE 1 TABLET BY MOUTH EVERY DAY Patient taking differently: Take 10 mg by mouth daily. 10/03/20  Yes Bensimhon, Shaune Pascal, MD  metolazone (ZAROXOLYN) 2.5 MG tablet Take 1 tablet (2.5 mg total) by mouth as directed. For wight of 200 lb and greater 09/20/20  Yes Bensimhon, Shaune Pascal, MD  Multiple Vitamins-Minerals (ONE-A-DAY MENS 50+) TABS Take 1 tablet by mouth daily with breakfast.   Yes [provider]  spironolactone (ALDACTONE) 25 MG tablet Take 0.5 tablets (12.5 mg total) by mouth daily. 07/20/20  Yes Bensimhon, Shaune Pascal, MD  torsemide (DEMADEX) 20 MG tablet Take 3 tablets (60 mg total) by mouth 2 (two) times daily. 07/28/20  Yes Bensimhon, Shaune Pascal, MD  ACCU-CHEK AVIVA PLUS test strip TEST BLOOD SUGAR TWICE DAILY BEFORE BREAKFAST AND AT BEDTIME 07/19/20   Cassandria Anger, MD  Insulin Pen Needle (BD PEN NEEDLE NANO U/F) 32G X 4 MM MISC 1 each by Does not apply route 4 (four) times daily. 12/23/18   Cassandria Anger, MD    Inpatient Medications: Scheduled Meds:  aspirin  81 mg Oral Daily   carvedilol  3.125 mg Oral Q breakfast   carvedilol  6.25 mg Oral Q supper   insulin aspart  0-15 Units Subcutaneous TID WC   insulin aspart  10 Units Intravenous Once   pantoprazole (PROTONIX) IV  40 mg Intravenous Q12H   sodium chloride flush  3 mL Intravenous Q12H   sodium chloride flush  3 mL Intravenous Q12H   [START ON 10/19/2020] spironolactone  12.5 mg Oral Daily   [START ON  10/19/2020] torsemide  60 mg Oral BID   Continuous Infusions:  sodium chloride     cefTRIAXone (ROCEPHIN)  IV 2 g (10/17/20 1455)   octreotide  (SANDOSTATIN)    IV infusion 50 mcg/hr (10/18/20 0715)   PRN Meds: sodium chloride, acetaminophen **OR** acetaminophen, bisacodyl, HYDROmorphone (DILAUDID) injection, ipratropium, levalbuterol, ondansetron **OR** ondansetron (ZOFRAN) IV, oxyCODONE, senna-docusate, sodium chloride flush, traZODone  Allergies:   No Known Allergies  Social History:   Social History   Socioeconomic History   Marital status: Married    Spouse name: Not on file  Number of children: 2   Years of education: Not on file   Highest education level: Not on file  Occupational History   Occupation: Designer, industrial/product    Employer: RETIRED  Tobacco Use   Smoking status: Former    Packs/day: 1.00    Years: 10.00    Pack years: 10.00    Types: Cigarettes    Quit date: 03/26/1986    Years since quitting: 34.5   Smokeless tobacco: Never   Tobacco comments:    "stopped cigarette  smoking 1988"  Vaping Use   Vaping Use: Never used  Substance and Sexual Activity   Alcohol use: Not Currently    Alcohol/week: 0.0 standard drinks    Comment: "quit alcohol ~ 2007"   Drug use: No   Sexual activity: Yes    Partners: Female  Other Topics Concern   Not on file  Social History Narrative   Not on file   Social Determinants of Health   Financial Resource Strain: Not on file  Food Insecurity: No Food Insecurity   Worried About [address] in the Last Year: Never true   [place] in the Last Year: Never true  Transportation Needs: No Transportation Needs   Lack of Transportation (Medical): No   Lack of Transportation (Non-Medical): No  Physical Activity: Not on file  Stress: Not on file  Social Connections: Not on file  Intimate Partner Violence: Not on file    Family History:   Family History  Problem Relation Age of Onset   Heart attack Mother     Hypertension Mother    Heart attack Father    Hypertension Father    Heart attack Brother    Colon cancer Neg Hx      ROS:  Please see the history of present illness.  All other ROS reviewed and negative.     Physical Exam/Data:   Vitals:   10/17/20 2142 10/17/20 2206 10/18/20 0236 10/18/20 0619  BP: 108/64 (!) 107/56 (!) 105/53 (!) 105/54  Pulse: (!) 58 60 60 60  Resp: 18 18 20 19   Temp: 97.7 F (36.5 C) 98 F (36.7 C) 98.2 F (36.8 C) 98.3 F (36.8 C)  TempSrc: Oral Oral Oral Oral  SpO2: 100% 100% 100% 100%  Weight:    90.5 kg  Height:        Intake/Output Summary (Last 24 hours) at 10/18/2020 0908 Last data filed at 10/18/2020 0644 Gross per 24 hour  Intake 1812.59 ml  Output 300 ml  Net 1512.59 ml   Last 3 Weights 10/18/2020 10/17/2020 09/20/2020  Weight (lbs) 199 lb 8.3 oz 196 lb 201 lb 12.8 oz  Weight (kg) 90.5 kg 88.905 kg 91.536 kg     Body mass index is 27.06 kg/m.  General: Well developed, well nourished AAM in no acute distress. Head: Normocephalic, atraumatic, sclera non-icteric, no xanthomas, nares are without discharge. Neck: Negative for carotid bruits. JVP not elevated. Lungs: Clear bilaterally to auscultation without wheezes, rales, or rhonchi. Breathing is unlabored. Heart: RRR S1 S2 without murmurs, rubs, or gallops.  Abdomen: Soft, non-tender, non-distended with normoactive bowel sounds. No rebound/guarding. Extremities: No clubbing or cyanosis. No edema. Distal pedal pulses are 2+ and equal bilaterally. Neuro: Alert and oriented X 3. Moves all extremities spontaneously. Psych:  Responds to questions appropriately with a normal affect.   EKG:  The EKG was personally reviewed and demonstrates:  suspected underlying afib with occasional ectopy with RBBB vs intermittent V pacing  Telemetry:  Telemetry was personally reviewed and demonstrates:  V paced rhythm, suspect underlying atrial fib  Relevant CV Studies: 2D echo 06/23/20  1. Left  ventricular ejection fraction, by estimation, is 25 to 30%. The  left ventricle has severely decreased function. The left ventricle  demonstrates global hypokinesis. The left ventricular internal cavity size  was mildly dilated. Left ventricular  diastolic parameters are indeterminate.   2. Right ventricular systolic function is mildly reduced. The right  ventricular size is normal. There is moderately elevated pulmonary artery  systolic pressure. The estimated right ventricular systolic pressure is  97.9 mmHg.   3. Left atrial size was moderately dilated.   4. Right atrial size was moderately dilated.   5. The mitral valve is abnormal. Moderate mitral valve regurgitation.   6. The aortic valve has been repaired/replaced. Aortic valve  regurgitation is not visualized. There is a 29 mm Sapien prosthetic (TAVR)  valve present in the aortic position. Aortic valve mean gradient measures  7.0 mmHg. Normal prothetic function without   paravalvular regurgitation.   7. The inferior vena cava is dilated in size with <50% respiratory  variability, suggesting right atrial pressure of 15 mmHg.   Laboratory Data:  High Sensitivity Troponin:   Recent Labs  Lab 10/17/20 0912 10/17/20 1236 10/17/20 1800 10/17/20 1958  TROPONINIHS 58* 57* 62* 61*     Chemistry Recent Labs  Lab 10/17/20 0812 10/18/20 0431  NA 126* 135  K 3.9 3.9  CL 92* 99  CO2 25 26  GLUCOSE 470* 95  BUN 104* 90*  CREATININE 1.50* 1.39*  CALCIUM 8.2* 8.5*  GFRNONAA 49* 53*  ANIONGAP 9 10    Recent Labs  Lab 10/17/20 0812  PROT 5.8*  ALBUMIN 2.7*  AST 34  ALT 20  ALKPHOS 146*  BILITOT 1.4*   Hematology Recent Labs  Lab 10/17/20 0812 10/18/20 0431  WBC 9.9 10.7*  RBC 2.20* 2.78*  HGB 7.3* 9.1*  HCT 21.6* 26.7*  MCV 98.2 96.0  MCH 33.2 32.7  MCHC 33.8 34.1  RDW 17.0* 18.6*  PLT 122* 128*   BNP Recent Labs  Lab 10/17/20 0812  BNP 753.0*  851.0*    DDimer No results for input(s): DDIMER in the  last 168 hours.   Radiology/Studies:  DG Chest Port 1 View  Result Date: 10/17/2020 CLINICAL DATA:  Weakness, fall EXAM: PORTABLE CHEST 1 VIEW COMPARISON:  06/08/2020 FINDINGS: Low lung volumes. No consolidation or edema. No pleural effusion or pneumothorax. Stable cardiomediastinal contours. Post TAVR. Left chest wall dual lead pacemaker. IMPRESSION: No acute process in the chest. Electronically Signed   By: Macy Mis M.D.   On: 10/17/2020 08:38     Assessment and Plan:   1. Symptomatic anemia with GI bleed - Eliquis on hold, tentatively continued on ASA but will d/w MD whether this can be discontinued as well - he is >6 months out from TAVR and otherwise has no CAD - he appears compensated from cardiac standpoint and anticipate he can undergo GI procedures without further cardiac testing but will verify with Dr. Harl Bowie  2. Chronic systolic CHF (biventricular) - appears compensated - agree with med plan as outlined - carvedilol today with hold parameters in place and resumption of home spironolactone and torsemide tomorrow if doing well - hold off ACEI/ARB/ARNI as previously mentioned due to CKD/tendency for soft BP  3. Elevated troponin with h/o normal coronaries 11/2019 - suspect related to demand ischemia - given recent cath within the last  year without CAD and no angina, do not anticipate further ischemic workup  4. H/o PPM - prior failed CRT upgrade due to occluded subclavian vein - continue usual OP f/u with EP  5. Chronic atrial fib/paroxysmal atrial flutter - Eliquis on hold as above - rates OK  6. Severe AS s/p TAVR 04/2020 - last echo 05/2020 with normal valve function - Eliquis on hold - will discuss ASA with MD  7. Moderate MR - previously susected functional from dilated CM - continue to follow as OP  Other medical issues per IM: - DM with marked hyperglycemia/probable pseudohyponatremia on admission - CKD stage IIIa with Cr similar to prior baseline -  possible cirrhosis   Risk Assessment/Risk Scores:        New York Heart Association (NYHA) Functional Class NYHA Class II  CHA2DS2-VASc Score = 5  This indicates a 7.2% annual risk of stroke. The patient's score is based upon: CHF History: Yes HTN History: Yes Diabetes History: Yes Stroke History: No Vascular Disease History: Yes - aortic calcification on prior CT Age Score: 1 Gender Score: 0     For questions or updates, please contact [place] Please consult [email or web address] for contact info under    Signed, Charlie Pitter, PA-C  10/18/2020 9:08 AM  Patient seen and discussed with PA Dunn, I agree with her documentation. 75 yo male history of chronic systolic HF 05/7900 echo LVEF 25-30% with NICM, aortic stenosis s/p TAVR, complete heart block with pacemaker, HTN, DM2, afib/aflutter, CKD 3 presented with genralized weakness and a fall at home. From ER notes EMS found him to be orthostatic.         From 07/29/2019 HF clinic note home weight 194 lbs         Na 126 BUN 104 Cr 1.5 WBC 9.9 Hgb 7.3 (down from 14.6 last month) Plt 122        BNP 851 (priors 1600-2400) FOBT + Trop 58-->57-->62-->61 COVID neg CXR no acute process EKG afib, vpacing   05/2020 echo: LVEF 40-97%, indet diastolic,  mild RV dysfunction, normal TAVR valve       Patient presents with symptomatic anemia, Hgb down from 14.6 to 7.3 on admission. We are asked to assess cardiology clearance for GI endoscopy.     From HF standpoint baseline weight appears to be between 195-200 lbs, admitted at 195 lbs. BNP lower than priors, CXR was clear. Likely hypovolemic on admission in setting of blood loss, reportedly orthostatic by EMS. Diuretics held on admission, may restart oral torsemide back tomorrow, hold any metolazone. Continue  his home coreg, aldactone. Had not been ACE/ARB/ARNI assuming due to renal dysfunction. Monitor volume status with transfusions     Regarding his hstiroy of afib/aflutter,  continue rate control with coreg. Off eliquis for now given GI bleed.   History of CHB with PPM, normal device check 10/13/20. No inpatient needs.     Preop evaluation His chronic cardiac conditions are stable, recommend proceeding with GI procedures as needed.     We will sign off inpatient care, call us back if needed this admission.    [name] MD           Note Details  Rosanna Randy, Alphonse Guild, MD File Time 10/18/2020  9:29 AM  Author Type Physician Status Signed  Last Editor Arnoldo Lenis, [place] # [id number] Admit Date 10/17/2020

## 2020-10-18 NOTE — Anesthesia Preprocedure Evaluation (Signed)
Anesthesia Evaluation  Patient identified by MRN, date of birth, ID band Patient awake    Reviewed: Allergy & Precautions, H&P , NPO status , Patient's Chart, lab work & pertinent test results, reviewed documented beta blocker date and time   Airway Mallampati: II  TM Distance: >3 FB Neck ROM: full    Dental no notable dental hx.    Pulmonary neg pulmonary ROS, former smoker,    Pulmonary exam normal breath sounds clear to auscultation       Cardiovascular Exercise Tolerance: Good hypertension, + pacemaker  Rhythm:regular Rate:Normal     Neuro/Psych negative neurological ROS  negative psych ROS   GI/Hepatic negative GI ROS, (+) Hepatitis -, Autoimmune  Endo/Other  negative endocrine ROSdiabetes  Renal/GU CRFRenal disease  negative genitourinary   Musculoskeletal   Abdominal   Peds  Hematology  (+) Blood dyscrasia, anemia ,   Anesthesia Other Findings   Reproductive/Obstetrics negative OB ROS                             Anesthesia Physical Anesthesia Plan  ASA: 3 and emergent  Anesthesia Plan: General   Post-op Pain Management:    Induction:   PONV Risk Score and Plan: Propofol infusion  Airway Management Planned:   Additional Equipment:   Intra-op Plan:   Post-operative Plan:   Informed Consent: I have reviewed the patients History and Physical, chart, labs and discussed the procedure including the risks, benefits and alternatives for the proposed anesthesia with the patient or authorized representative who has indicated his/her understanding and acceptance.     Dental Advisory Given  Plan Discussed with: CRNA  Anesthesia Plan Comments:         Anesthesia Quick Evaluation

## 2020-10-18 NOTE — Progress Notes (Signed)
History and Physical   Patient: John [name]                            PCP: Erven Colla, DO                    DOB: [birth year]            DOA: 10/17/2020 IDP:[id number]             DOS: 10/18/2020, 1:41 PM  Patient coming from:   HOME  I have personally reviewed patient's medical records, in electronic medical records, including:  [facility], and care everywhere.   PCP: Erven Colla, DO   Chief complaint; weakness  Subjective:   The patient was seen and examined this morning, stable no acute distress reporting no active bleeding.  Denies any shortness of breath or chest pain. Tolerated blood transfusion well overnight.   Anticipating EGD today 10/18/2020   History of present illness:    John [name] is a 75 y.o. male with medical history significant of   combined systolic/diastolic congestive heart failure IIIB, A. fib/flutter - w pacemaker on Eliquis, ICM,-cath September 2021, severe aortic valve stenosis -post TAVR February 2022, HTN, HLD, DMII, gout, CKD IIIb...Marland KitchenMarland Kitchen Presented with generalized weaknesses, fall 3 days ago without any injuries patient denies hitting his head or sustaining any physical trauma.   EMS noted orthostatic hypotensive   Patient Denies having: Fever, Chills, Cough, SOB, Chest Pain, Abd pain, N/V/D, headache, dizziness, lightheadedness,  Dysuria, Joint pain, rash, open wounds  ED Course:  Blood pressure 131/65, pulse 82, temperature 97.7 F (36.5 C), temperature source Oral, resp. rate 20, height 6' (1.829 m), weight 88.9 kg, SpO2 100 %..vital Abnormal labs; CBC WBC 9.9, hemoglobin 7.3, hematocrit 21.6, GFR 49, sodium 126, potassium 3.9, BUN 104, creatinine 1.5, glucose 470, Hemoccult positive x2 Influenza A/B, SARS-CoV-2-negative    Assessment / Plan:   Principal Problem:   GIB (gastrointestinal bleeding) Active Problems:   DM type 2 causing vascular disease (HCC)   Mixed hyperlipidemia   Essential hypertension, benign    Atrial flutter (HCC)   Current use of long term anticoagulation   CKD (chronic kidney disease) stage 3, GFR 30-59 ml/min (HCC)   Hyponatremia   Thrombocytopenia (HCC)   Permanent atrial fibrillation (HCC)   S/P TAVR (transcatheter aortic valve replacement)   Polyarticular gout   Anemia  Principal Problem:   GIB (gastrointestinal bleeding) -Hemodynamically stable -In the face of chronic anticoagulation with Eliquis -Hemoccult positive, hemoglobin is 7.3 (hemoglobin was 14.6 on 09/12/2020) >> 9.1 -GI following anticipating EGD today   -Withholding chronic anticoagulation Eliquis, -Monitoring vitals closely...  -Initiating IV Protonix -Status post 2 units of PRBC transfusion 10/17/2020  Acute on chronic anemia/anemia of chronic disease, exacerbated by current GI bleed -Hemoglobin 7.3, hematocrit 21.6. >>> hemoglobin 9.1 Hemoccult positive -2 units of PRBC transfusion  -Tolerated well, -Not complaining of shortness of breath or chest pain   History of systolic/diastolic congestive heart failure With holding diuretics due to hypotension, hyponatremia, elevated BUN/creatinine -Monitoring daily weight and I's and O's -Stable on room air  -Due to elevated BUN/creatinine, hyponatremia -we will withhold Zaroxolyn, Aldactone, and Demadex for next 24 hours -We will monitor for any volume overload or shortness of breath utilize as needed Lasix  History of severe aortic stenosis-status post TAVR -closely, asymptomatic  Active Problems:  DM type II -hyperglycemia -Home medication of Tresiba and  Trulicity will be held, [place] q. Weekly taken at) -Checking CBG QA CHS, SSI coverage -Monitoring CBGs stable   Mixed hyperlipidemia -continue statin when tolerating p.o., stable    Essential hypertension, benign -monitoring BP closely, continue Coreg, will continue Aldactone in a.m.  Dysrhythmia-atrial A. fib/flutter/bradycardia - currently paced on a pacemaker   Current use of long  term anticoagulation -due to A. fib on Eliquis (holding due to GI bleed) -Cardiology consulted for clearance for procedure.  CKD (chronic kidney disease) stage 3, GFR 30-59 ml/min (HCC)  -BUN 104, creatinine 1.50 >> 1.39  monitoring, avoiding nephrotoxins, gentle IV fluid hydration -BUN/creatinine improving,  Hyponatremia -acute on chronic, monitoring, with holding diuretics, sodium 135,  Thrombocytopenia (HCC) -monitoring closely, will transfuse as needed     Polyarticular gout -Zoom allopurinol, colchicine as needed when tolerating p.o.  Generalized weaknesses/status post fall -Consulting PT OT for evaluation recommendations  Cultures:  -none Antimicrobial: -none  Consults called: GI/cardiologist -------------------------------------------------------------------------------------------------------------------------------------------- DVT prophylaxis: SCD/Compression stockings Code Status:   Code Status: Full Code   Admission status: Patient will be admitted as Inpatient, with a greater than 2 midnight length of stay. Level of care: Med-Surg   Family Communication:  none at bedside  (The above findings and plan of care has been discussed with patient in detail, the patient expressed understanding and agreement of above plan)  --------------------------------------------------------------------------------------------------------------------------------------------------  Disposition Plan:  Anticipated 1-2 days Status is: Inpatient  Remains inpatient appropriate because:Inpatient level of care appropriate due to severity of illness  Dispo: The patient is from: Home              Anticipated d/c is to: Home              Patient currently is not medically stable to d/c.   Difficult to place patient No    Review of Systems: As per HPI, otherwise 10 point review of systems were negative.    ----------------------------------------------------------------------------------------------------------------------  No Known Allergies  Home MEDs:  Prior to Admission medications   Medication Sig Start Date End Date Taking? Authorizing Provider  acetaminophen (TYLENOL) 325 MG tablet Take 2 tablets (650 mg total) by mouth every 4 (four) hours as needed for headache or mild pain. 05/15/20  Yes Isaiah Serge, NP  allopurinol (ZYLOPRIM) 300 MG tablet Take 0.5 tablets (150 mg total) by mouth daily. 07/20/20  Yes Bensimhon, Shaune Pascal, MD  apixaban (ELIQUIS) 5 MG TABS tablet Take 1 tablet (5 mg total) by mouth 2 (two) times daily. 05/24/20  Yes Verta Ellen., NP  aspirin 81 MG chewable tablet Chew 1 tablet (81 mg total) by mouth daily. 05/16/20  Yes Isaiah Serge, NP  benzonatate (TESSALON) 100 MG capsule Take 1 capsule (100 mg total) by mouth 3 (three) times daily as needed for cough. 09/27/20  Yes Evans Lance, MD  carvedilol (COREG) 3.125 MG tablet TAKE 1 TABLET IN THE MORNING AND 2 TABLETS IN THE EVENING Patient taking differently: Take 3.125-6.25 mg by mouth 2 (two) times daily with a meal. 1 tablet in the morning and 2 tablets in the evening 07/20/20  Yes Bensimhon, Shaune Pascal, MD  colchicine 0.6 MG tablet Take 1 tablet (0.6 mg total) by mouth daily as needed. Patient taking differently: Take 0.6 mg by mouth daily as needed (gout). 05/15/20  Yes Isaiah Serge, NP  Dulaglutide (TRULICITY) 1.5 AV/6.9VX SOPN INJECT 1.5MG (1 PEN) SUBCUTANEOUSLY EVERY WEEK Patient taking differently: Inject 1.5 mg into the skin once a week. 10/05/20  Yes  Cassandria Anger, MD  insulin degludec (TRESIBA FLEXTOUCH) 100 UNIT/ML FlexTouch Pen Inject 20 Units into the skin daily. 08/25/20  Yes Nida, Marella Chimes, MD  KLOR-CON M20 20 MEQ tablet TAKE 1.5 TABLETS (30 MEQ TOTAL) BY MOUTH 2 (TWO) TIMES DAILY. Patient taking differently: Take 30 mEq by mouth 2 (two) times daily. 08/08/20  Yes Evans Lance, MD   loratadine (CLARITIN) 10 MG tablet TAKE 1 TABLET BY MOUTH EVERY DAY Patient taking differently: Take 10 mg by mouth daily. 10/03/20  Yes Bensimhon, Shaune Pascal, MD  metolazone (ZAROXOLYN) 2.5 MG tablet Take 1 tablet (2.5 mg total) by mouth as directed. For wight of 200 lb and greater 09/20/20  Yes Bensimhon, Shaune Pascal, MD  Multiple Vitamins-Minerals (ONE-A-DAY MENS 50+) TABS Take 1 tablet by mouth daily with breakfast.   Yes [provider]  spironolactone (ALDACTONE) 25 MG tablet Take 0.5 tablets (12.5 mg total) by mouth daily. 07/20/20  Yes Bensimhon, Shaune Pascal, MD  torsemide (DEMADEX) 20 MG tablet Take 3 tablets (60 mg total) by mouth 2 (two) times daily. 07/28/20  Yes Bensimhon, Shaune Pascal, MD  ACCU-CHEK AVIVA PLUS test strip TEST BLOOD SUGAR TWICE DAILY BEFORE BREAKFAST AND AT BEDTIME 07/19/20   Cassandria Anger, MD  Insulin Pen Needle (BD PEN NEEDLE NANO U/F) 32G X 4 MM MISC 1 each by Does not apply route 4 (four) times daily. 12/23/18   Cassandria Anger, MD    PRN MEDs: Doug Sou Hold] sodium chloride, [MAR Hold] acetaminophen **OR** [MAR Hold] acetaminophen, [MAR Hold] bisacodyl, [MAR Hold]  HYDROmorphone (DILAUDID) injection, [MAR Hold] ipratropium, [MAR Hold] levalbuterol, [MAR Hold] ondansetron **OR** [MAR Hold] ondansetron (ZOFRAN) IV, [MAR Hold] oxyCODONE, [MAR Hold] senna-docusate, [MAR Hold] sodium chloride flush, [MAR Hold] traZODone reports that he quit smoking about 34 years ago. His smoking use included cigarettes. He has a 10.00 pack-year smoking history. He has never used smokeless tobacco. He reports previous alcohol use. He reports that he does not use drugs.   Family History  Problem Relation Age of Onset   Heart attack Mother    Hypertension Mother    Heart attack Father    Hypertension Father    Heart attack Brother    Colon cancer Neg Hx     Physical Exam:   Vitals:   10/17/20 2206 10/18/20 0236 10/18/20 0619 10/18/20 1339  BP: (!) 107/56 (!) 105/53 (!) 105/54    Pulse: 60 60 60   Resp: 18 20 19    Temp: 98 F (36.7 C) 98.2 F (36.8 C) 98.3 F (36.8 C)   TempSrc: Oral Oral Oral Oral  SpO2: 100% 100% 100%   Weight:   90.5 kg   Height:          Physical Exam:   General:  Alert, oriented, cooperative, no distress;   HEENT:  Normocephalic, PERRL, otherwise with in Normal limits   Neuro:  CNII-XII intact. , normal motor and sensation, reflexes intact   Lungs:   Clear to auscultation BL, Respirations unlabored, no wheezes / crackles  Cardio:    S1/S2, RRR, No murmure, No Rubs or Gallops   Abdomen:   Soft, non-tender, bowel sounds active all four quadrants,  no guarding or peritoneal signs.  Muscular skeletal:  Limited exam - in bed, able to move all 4 extremities, Normal strength,  2+ pulses,  symmetric, No pitting edema  Skin:  Dry, warm to touch, negative for any Rashes,  Wounds: Please see nursing documentation  Labs on admission:    I have personally reviewed following labs and imaging studies  CBC: Recent Labs  Lab 10/17/20 0812 10/18/20 0431  WBC 9.9 10.7*  NEUTROABS 7.3  --   HGB 7.3* 9.1*  HCT 21.6* 26.7*  MCV 98.2 96.0  PLT 122* 542*   Basic Metabolic Panel: Recent Labs  Lab 10/17/20 0812 10/18/20 0431  NA 126* 135  K 3.9 3.9  CL 92* 99  CO2 25 26  GLUCOSE 470* 95  BUN 104* 90*  CREATININE 1.50* 1.39*  CALCIUM 8.2* 8.5*   GFR: Estimated Creatinine Clearance: 51.2 mL/min (A) (by C-G formula based on SCr of 1.39 mg/dL (H)). Liver Function Tests: Recent Labs  Lab 10/17/20 0812  AST 34  ALT 20  ALKPHOS 146*  BILITOT 1.4*  PROT 5.8*  ALBUMIN 2.7*   No results for input(s): LIPASE, AMYLASE in the last 168 hours. No results for input(s): AMMONIA in the last 168 hours. Coagulation Profile: Recent Labs  Lab 10/17/20 0812 10/18/20 0431  INR 1.5* 1.3*   Cardiac Enzymes: No results for input(s): CKTOTAL, CKMB, CKMBINDEX, TROPONINI in the last 168 hours. BNP (last 3 results) No  results for input(s): PROBNP in the last 8760 hours. HbA1C: Recent Labs    10/17/20 1800  HGBA1C 8.2*   CBG: Recent Labs  Lab 10/17/20 1439 10/17/20 1720 10/17/20 2121 10/18/20 0726 10/18/20 1108  GLUCAP 336* 265* 143* 113* 180*   Lipid Profile: No results for input(s): CHOL, HDL, LDLCALC, TRIG, CHOLHDL, LDLDIRECT in the last 72 hours. Thyroid Function Tests: No results for input(s): TSH, T4TOTAL, FREET4, T3FREE, THYROIDAB in the last 72 hours. Anemia Panel: No results for input(s): VITAMINB12, FOLATE, FERRITIN, TIBC, IRON, RETICCTPCT in the last 72 hours. Urine analysis:    Component Value Date/Time   COLORURINE YELLOW 05/02/2020 [place] 05/02/2020 1449   LABSPEC 1.005 05/02/2020 1449   PHURINE 7.0 05/02/2020 1449   GLUCOSEU >=500 (A) 05/02/2020 1449   HGBUR NEGATIVE 05/02/2020 1449   BILIRUBINUR NEGATIVE 05/02/2020 1449   KETONESUR NEGATIVE 05/02/2020 1449   PROTEINUR NEGATIVE 05/02/2020 1449   UROBILINOGEN 0.2 01/05/2014 0744   NITRITE NEGATIVE 05/02/2020 1449   LEUKOCYTESUR NEGATIVE 05/02/2020 1449     Radiologic Exams on Admission:   DG Chest Port 1 View  Result Date: 10/17/2020 CLINICAL DATA:  Weakness, fall EXAM: PORTABLE CHEST 1 VIEW COMPARISON:  06/08/2020 FINDINGS: Low lung volumes. No consolidation or edema. No pleural effusion or pneumothorax. Stable cardiomediastinal contours. Post TAVR. Left chest wall dual lead pacemaker. IMPRESSION: No acute process in the chest. Electronically Signed   By: Macy Mis M.D.   On: 10/17/2020 08:38    EKG:   Independently reviewed.  Orders placed or performed during the hospital encounter of 10/17/20   ED EKG   ED EKG   EKG 12-Lead   EKG 12-Lead   EKG 12-Lead   EKG    ---------------------------------------------------------------------------------------------------------------------------------------    -------------------------------------------------------------------------------------------------------------------------------------------------  Time spent: > than  66  Min.   SIGNED: [name], MD, FHM. [facility],  Pager (Please use [email or web address] to page to text)  If 7PM-7AM, please contact night-coverage [email or web address],  10/18/2020, 1:41 PM

## 2020-10-18 NOTE — Brief Op Note (Addendum)
10/17/2020 - 10/18/2020  4:09 PM  PATIENT:  John [name]  75 y.o. male  PRE-OPERATIVE DIAGNOSIS:  upper gastrointestinal bleeding  POST-OPERATIVE DIAGNOSIS:  portal gastropathy; duodenal ulcer with one clip placed;   PROCEDURE:  Procedure(s): ESOPHAGOGASTRODUODENOSCOPY (EGD) WITH PROPOFOL (N/A)  SURGEON:  Surgeon(s) and Role:    * Harvel Quale, MD - Primary  Patient underwent EGD under propofol sedation. Tolerated the procedure adequately. Esophagus was normal. Stomach showed presence of mild areas of erythema in the body, no presence of stigmata of bleeding. One non-bleeding cratered duodenal ulcer with a nonbleeding visible vessel (Forrest Class IIa) was found in the duodenal bulb - likely the GDA.  The lesion was 20 mm in largest dimension.  Area was successfully injected with 2 mL of a 1:10,000 solution of epinephrine for hemostasis.  For hemostasis, two hemostatic clips were attempted to be placed. Unfortunately, the edges of the ulcer were too fibrotic and friable so no adequate grasping could be achieved. One clip was deployed in proxity to the ulcer to attempt to "narrow the gap", but this did not help. A second clip was used but not deployed.  There was no bleeding at the end of the procedure. No blood was seen in the lumen of the inspected duodenum.  RECOMMENDATIONS: - Return patient to ICU for ongoing care.  - NPO.  - PPI drip. - IR consultation for GDA embolization. - Avoid NSAIDs, including topical Voltaren - Check H. Pylori serology.  [name], MD Gastroenterology and Hepatology [facility]

## 2020-10-18 NOTE — Evaluation (Signed)
Occupational Therapy Evaluation Patient Details Name: John [name] MRN: [id number] DOB: 09-10-1945 Today's Date: 10/18/2020    History of Present Illness John [name] is a 75 y.o. male with medical history significant of   combined systolic/diastolic congestive heart failure IIIB, A. fib/flutter - w pacemaker on Eliquis, ICM,-cath September 2021, severe aortic valve stenosis -post TAVR February 2022, HTN, HLD, DMII, gout, CKD IIIb...Marland KitchenMarland Kitchen Presented with generalized weaknesses, fall 3 days ago without any injuries patient denies hitting his head or sustaining any physical trauma.    EMS noted orthostatic hypotensive   Clinical Impression   Pt agreeable to OT/PT co-evaluation. Pt demonstrates need for SPV assist for bed mobility and Min A for transfers with RW. Pt demonstrates R UE shoulder limitations to ~90* from previous sports injury. Pt demonstrates L UE weakness and 3+/5 MMT for shoulder movement. Pt left in chair with spouse present. Pt will benefit from continued OT in the hospital and recommended venue below to increase strength, balance, and endurance for safe ADL's.      Follow Up Recommendations  Home health OT;Supervision/Assistance - 24 hour    Equipment Recommendations  None recommended by OT           Precautions / Restrictions Precautions Precautions: Fall;ICD/Pacemaker Restrictions Weight Bearing Restrictions: No      Mobility Bed Mobility Overal bed mobility: Needs Assistance Bed Mobility: Supine to Sit     Supine to sit: Supervision     General bed mobility comments: slow labored movement    Transfers Overall transfer level: Needs assistance Equipment used: Rolling walker (2 wheeled) Transfers: Sit to/from [facility] Sit to Stand: Min assist Stand pivot transfers: Min assist       General transfer comment: Min A with use of RW; More difficult with more moderate assist needed in initial trial without RW.    Balance Overall  balance assessment: Needs assistance Sitting-balance support: Feet supported Sitting balance-Leahy Scale: Good Sitting balance - Comments: EOB   Standing balance support: Bilateral upper extremity supported;During functional activity Standing balance-Leahy Scale: Poor (poor to fair with RW)                             ADL either performed or assessed with clinical judgement   ADL Overall ADL's : Needs assistance/impaired                     Lower Body Dressing: Bed level;Total assistance Lower Body Dressing Details (indicate cue type and reason): donning socks; pt's wife assist at home. Toilet Transfer: RW;Minimal assistance;Stand-pivot;Ambulation Toilet Transfer Details (indicate cue type and reason): simulated via EOB ambulatory transfer to chair using RW with Min A. More mod A without RW.                 Vision Baseline Vision/History: Wears glasses Wears Glasses: At all times Patient Visual Report: No change from baseline                  Pertinent Vitals/Pain Pain Assessment: No/denies pain     Hand Dominance Right   Extremity/Trunk Assessment Upper Extremity Assessment Upper Extremity Assessment: RUE deficits/detail;LUE deficits/detail RUE Deficits / Details: Limited to ~90*A/ROM and P/ROM for shoulder flexion and abduction due to an old football injury, per pt report. RUE Sensation: WNL RUE Coordination: decreased gross motor (Due to ROM deficits) LUE Deficits / Details: 3+/5 MMT shoulder flexion and abduction; 4/5 elbow flexion and extension.  LUE Sensation: WNL LUE Coordination: WNL   Lower Extremity Assessment Lower Extremity Assessment: Defer to PT evaluation   Cervical / Trunk Assessment Cervical / Trunk Assessment: Normal   Communication Communication Communication: No difficulties   Cognition Arousal/Alertness: Awake/alert Behavior During Therapy: WFL for tasks assessed/performed Overall Cognitive Status: Within  Functional Limits for tasks assessed                                                      Home Living Family/patient expects to be discharged to:: Private residence Living Arrangements: Spouse/significant other Available Help at Discharge: Family;Available 24 hours/day Type of Home: House Home Access: Level entry     Home Layout: Able to live on main level with bedroom/bathroom;Two level;Laundry or work area in basement Alternate Therapist, sports of Steps: 13 steps to basement Alternate Level Stairs-Rails: Left Bathroom Shower/Tub: Occupational psychologist: Handicapped height Bathroom Accessibility: Yes How Accessible: Accessible via wheelchair [place] - single point;Shower seat - built in;Walker - 2 wheels;Grab bars - tub/shower          Prior Functioning/Environment Level of Independence: Needs assistance  Gait / Transfers Assistance Needed: I without device withambulation ADL's / Homemaking Assistance Needed: sife asssisted with donning socks   Comments: Hydrographic surveyor, drives        OT Problem List: Decreased strength;Decreased range of motion;Impaired balance (sitting and/or standing)      OT Treatment/Interventions: Self-care/ADL training;Therapeutic exercise;Therapeutic activities;Balance training;Patient/family education    OT Goals(Current goals can be found in the care plan section) Acute Rehab OT Goals Patient Stated Goal: return home OT Goal Formulation: With patient Time For Goal Achievement: 11/01/20 Potential to Achieve Goals: Good  OT Frequency: Min 2X/week               Co-evaluation PT/OT/SLP Co-Evaluation/Treatment: Yes Reason for Co-Treatment: To address functional/ADL transfers   OT goals addressed during session: ADL's and self-care;Strengthening/ROM                       End of Session Equipment Utilized During Treatment: Rolling walker  Activity Tolerance: Patient tolerated  treatment well Patient left: in chair;with call bell/phone within reach;with family/visitor present  OT Visit Diagnosis: Unsteadiness on feet (R26.81);Muscle weakness (generalized) (M62.81);History of falling (Z91.81)                Time: 2355-7322 OT Time Calculation (min): 20 min Charges:  OT General Charges $OT Visit: 1 Visit OT Evaluation $OT Eval Low Complexity: 1 Low  Shyler Hamill OT, MOT  Larey Seat 10/18/2020, 9:39 AM

## 2020-10-18 NOTE — Anesthesia Procedure Notes (Signed)
Date/Time: 10/18/2020 3:36 PM Performed by: Orlie Dakin, CRNA Pre-anesthesia Checklist: Patient identified, Emergency Drugs available, Suction available and Patient being monitored Patient Re-evaluated:Patient Re-evaluated prior to induction Oxygen Delivery Method: Nasal cannula Induction Type: IV induction Placement Confirmation: positive ETCO2

## 2020-10-18 NOTE — Evaluation (Signed)
Physical Therapy Evaluation Patient Details Name: John [name] MRN: [id number] DOB: August 27, 1945 Today's Date: 10/18/2020   History of Present Illness  John [name] CURRENT is a 75 y.o. male with medical history significant of   combined systolic/diastolic congestive heart failure IIIB, A. fib/flutter - w pacemaker on Eliquis, ICM,-cath September 2021, severe aortic valve stenosis -post TAVR February 2022, HTN, HLD, DMII, gout, CKD IIIb...Marland KitchenMarland Kitchen Presented with generalized weaknesses, fall 3 days ago without any injuries patient denies hitting his head or sustaining any physical trauma.    EMS noted orthostatic hypotensive   Clinical Impression  Patient very unsteady on feet with near fall during transfers without use of AD, required use of RW for safety and demonstrates slightly labored cadence without loss of balance during ambulation, limited mostly due to fatigue and tolerated sitting up in chair with spouse present after therapy.  Patient will benefit from continued physical therapy in hospital and recommended venue below to increase strength, balance, endurance for safe ADLs and gait.     Follow Up Recommendations Home health PT;Supervision for mobility/OOB;Supervision - Intermittent    Equipment Recommendations  None recommended by PT    Recommendations for Other Services       Precautions / Restrictions Precautions Precautions: Fall;ICD/Pacemaker Restrictions Weight Bearing Restrictions: No      Mobility  Bed Mobility Overal bed mobility: Needs Assistance Bed Mobility: Supine to Sit     Supine to sit: Supervision     General bed mobility comments: slow labored movement    Transfers Overall transfer level: Needs assistance Equipment used: None;Rolling walker (2 wheeled) Transfers: Sit to/from Stand;Stand Pivot Transfers Sit to Stand: Min assist Stand pivot transfers: Min assist       General transfer comment: very unsteady on feet with near loss of balance without AD,  required use of RW for safety  Ambulation/Gait Ambulation/Gait assistance: Min guard Gait Distance (Feet): 80 Feet Assistive device: Rolling walker (2 wheeled) Gait Pattern/deviations: Decreased step length - right;Decreased step length - left;Decreased stride length Gait velocity: decreased   General Gait Details: slightly labored cadence with increased time for making turns, limited mostly due to fatigue  Stairs            Wheelchair Mobility    Modified Rankin (Stroke Patients Only)       Balance Overall balance assessment: Needs assistance Sitting-balance support: Feet supported;No upper extremity supported Sitting balance-Leahy Scale: Good Sitting balance - Comments: EOB   Standing balance support: During functional activity;No upper extremity supported Standing balance-Leahy Scale: Poor Standing balance comment: fair using RW                             Pertinent Vitals/Pain Pain Assessment: No/denies pain    Home Living Family/patient expects to be discharged to:: Private residence Living Arrangements: Spouse/significant other Available Help at Discharge: Family;Available 24 hours/day Type of Home: House Home Access: Level entry     Home Layout: Able to live on main level with bedroom/bathroom;Two level;Laundry or work area in [place]: Kasandra Knudsen - single point;Shower seat - built in;Walker - 2 wheels;Grab bars - tub/shower      Prior Function Level of Independence: Needs assistance   Gait / Transfers Assistance Needed: community ambulator without AD, drives  ADL's / Homemaking Assistance Needed: assisted by family  Comments: Hydrographic surveyor, drives     Hand Dominance   Dominant Hand: Right    Extremity/Trunk Assessment   Upper Extremity  Assessment Upper Extremity Assessment: Defer to OT evaluation RUE Deficits / Details: Limited to ~90*A/ROM and P/ROM for shoulder flexion and abduction due to an old football injury,  per pt report. RUE Sensation: WNL RUE Coordination: decreased gross motor (Due to ROM deficits) LUE Deficits / Details: 3+/5 MMT shoulder flexion and abduction; 4/5 elbow flexion and extension. LUE Sensation: WNL LUE Coordination: WNL    Lower Extremity Assessment Lower Extremity Assessment: Generalized weakness    Cervical / Trunk Assessment Cervical / Trunk Assessment: Normal  Communication   Communication: No difficulties  Cognition Arousal/Alertness: Awake/alert Behavior During Therapy: WFL for tasks assessed/performed Overall Cognitive Status: Within Functional Limits for tasks assessed                                        General Comments      Exercises     Assessment/Plan    PT Assessment Patient needs continued PT services  PT Problem List Decreased strength;Decreased activity tolerance;Decreased balance;Decreased mobility       PT Treatment Interventions DME instruction;Gait training;Stair training;Functional mobility training;Therapeutic activities;Therapeutic exercise;Patient/family education;Balance training    PT Goals (Current goals can be found in the Care Plan section)  Acute Rehab PT Goals Patient Stated Goal: return home PT Goal Formulation: With patient/family Time For Goal Achievement: 10/25/20 Potential to Achieve Goals: Good    Frequency Min 3X/week   Barriers to discharge        Co-evaluation   Reason for Co-Treatment: To address functional/ADL transfers   OT goals addressed during session: ADL's and self-care;Strengthening/ROM       AM-PAC PT "6 Clicks" Mobility  Outcome Measure Help needed turning from your back to your side while in a flat bed without using bedrails?: None Help needed moving from lying on your back to sitting on the side of a flat bed without using bedrails?: A Little Help needed moving to and from a bed to a chair (including a wheelchair)?: A Little Help needed standing up from a chair using  your arms (e.g., wheelchair or bedside chair)?: A Little Help needed to walk in hospital room?: A Little Help needed climbing 3-5 steps with a railing? : A Lot 6 Click Score: 18    End of Session   Activity Tolerance: Patient tolerated treatment well;Patient limited by fatigue Patient left: in chair;with call bell/phone within reach;with family/visitor present Nurse Communication: Mobility status PT Visit Diagnosis: Unsteadiness on feet (R26.81);Other abnormalities of gait and mobility (R26.89);Muscle weakness (generalized) (M62.81)    Time: 8937-3428 PT Time Calculation (min) (ACUTE ONLY): 27 min   Charges:   PT Evaluation $PT Eval Moderate Complexity: 1 Mod PT Treatments $Therapeutic Activity: 23-37 mins        11:04 AM, 10/18/20 Lonell Grandchild, MPT Physical Therapist with [facility] [phone] office [phone] mobile phone

## 2020-10-18 NOTE — Progress Notes (Signed)
Patient doing well this morning, remains NPO since midnight. Vital signs stable, remains mildly hypotensive but relatively close to his baseline BP. Deemed stable for EGD from cardiac standpoint by Dr. Harl Bowie with cardiology. Hemoglobin up to 9.1 after two units of PRBCs administered yesterday. Patient denies nausea, vomiting or diarrhea. States he did have a BM yesterday evening that he reports the nurse said was black in color. Otherwise, doing well and remains agreeable to proceed with EGD today.

## 2020-10-18 NOTE — Op Note (Addendum)
[facility] Patient Name: John [name] Procedure Date: 10/18/2020 3:19 PM MRN: [id number] Date of Birth: [birth year] Attending MD: [name] ,  CSN: [id number] Age: 75 Admit Type: Inpatient Procedure:                Upper GI endoscopy Indications:              Melena Providers:                [name], [place] Sharon Seller, RN, Nelma Rothman, Technician Referring MD:              Medicines:                Monitored Anesthesia Care Complications:            No immediate complications. Estimated Blood Loss:     Estimated blood loss: none. Procedure:                Pre-Anesthesia Assessment:                           - Prior to the procedure, a History and Physical                            was performed, and patient medications, allergies                            and sensitivities were reviewed. The patient's                            tolerance of previous anesthesia was reviewed.                           - The risks and benefits of the procedure and the                            sedation options and risks were discussed with the                            patient. All questions were answered and informed                            consent was obtained.                           - ASA Grade Assessment: III - A patient with severe                            systemic disease.                           After obtaining informed consent, the endoscope was                            passed under direct vision. Throughout the  procedure, the patient's blood pressure, pulse, and                            oxygen saturations were monitored continuously. The                            [id number]-H190 ([id number]) scope was introduced through the                            mouth, and advanced to the second part of duodenum.                            The upper GI endoscopy was accomplished without                             difficulty. The patient tolerated the procedure                            well. Scope In: 3:33:00 PM Scope Out: 3:53:31 PM Total Procedure Duration: 0 hours 20 minutes 31 seconds  Findings:      The examined esophagus was normal.      Localized mildly erythematous mucosa without bleeding was found in the       gastric body.      One non-bleeding cratered duodenal ulcer with a nonbleeding visible       vessel (Forrest Class IIa) was found in the duodenal bulb - likely the       GDA. The lesion was 20 mm in largest dimension. Area was successfully       injected with 2 mL of a 1:10,000 solution of epinephrine for hemostasis.       For hemostasis, two hemostatic clips were attempted to be placed.       Unfortunately, the edges of the ulcer were too fibrotic and friable so       no adequate grasping could be achieved. One clip was deployed in proxity       to the ulcer to attempt to "narrow the gap", but this did not help. A       second clip was used but not deployed. There was no bleeding at the end       of the procedure. No blood was seen in the lumen of the inspected       duodenum. Impression:               - Normal esophagus.                           - Erythematous mucosa in the gastric body.                           - Non-bleeding duodenal ulcer with a nonbleeding                            visible vessel (Forrest Class IIa). Injected. Clip                            was placed.                           -  No specimens collected. Moderate Sedation:      Per Anesthesia Care Recommendation:           - Return patient to ICU for ongoing care.                           - NPO.                           - PPI drip.                           - IR consultation for GDA embolization.                           - Avoid NSAIDs, including topical Voltaren Procedure Code(s):        --- Professional ---                           [phone], Esophagogastroduodenoscopy, flexible,                             transoral; with control of bleeding, any method Diagnosis Code(s):        --- Professional ---                           K31.89, Other diseases of stomach and duodenum                           K26.4, Chronic or unspecified duodenal ulcer with                            hemorrhage                           K92.1, Melena (includes Hematochezia) CPT copyright 2019 American Medical Association. All rights reserved. The codes documented in this report are preliminary and upon coder review may  be revised to meet current compliance requirements. [name], MD [name],  10/18/2020 4:08:36 PM This report has been signed electronically. Number of Addenda: 0

## 2020-10-19 ENCOUNTER — Inpatient Hospital Stay (HOSPITAL_COMMUNITY): Payer: Medicare HMO

## 2020-10-19 ENCOUNTER — Telehealth: Payer: Medicare HMO

## 2020-10-19 ENCOUNTER — Ambulatory Visit (HOSPITAL_COMMUNITY)
Admission: RE | Admit: 2020-10-19 | Discharge: 2020-10-19 | Disposition: A | Discharge status: Home / Self Care | Payer: Medicare HMO | Source: Ambulatory Visit | Attending: Gastroenterology | Admitting: Gastroenterology

## 2020-10-19 DIAGNOSIS — K264 Chronic or unspecified duodenal ulcer with hemorrhage: Secondary | ICD-10-CM | POA: Diagnosis not present

## 2020-10-19 DIAGNOSIS — K922 Gastrointestinal hemorrhage, unspecified: Secondary | ICD-10-CM | POA: Diagnosis present

## 2020-10-19 DIAGNOSIS — Z7901 Long term (current) use of anticoagulants: Secondary | ICD-10-CM | POA: Diagnosis not present

## 2020-10-19 DIAGNOSIS — D649 Anemia, unspecified: Secondary | ICD-10-CM | POA: Diagnosis not present

## 2020-10-19 DIAGNOSIS — K921 Melena: Secondary | ICD-10-CM | POA: Diagnosis not present

## 2020-10-19 HISTORY — PX: IR US GUIDE VASC ACCESS RIGHT: IMG2390

## 2020-10-19 HISTORY — PX: IR ANGIOGRAM VISCERAL SELECTIVE: IMG657

## 2020-10-19 HISTORY — PX: IR ANGIOGRAM SELECTIVE EACH ADDITIONAL VESSEL: IMG667

## 2020-10-19 HISTORY — PX: IR EMBO ART  VEN HEMORR LYMPH EXTRAV  INC GUIDE ROADMAPPING: IMG5450

## 2020-10-19 LAB — CBC
HCT: 28.3 % — ABNORMAL LOW (ref 39.0–52.0)
Hemoglobin: 9.2 g/dL — ABNORMAL LOW (ref 13.0–17.0)
MCH: 32.6 pg (ref 26.0–34.0)
MCHC: 32.5 g/dL (ref 30.0–36.0)
MCV: 100.4 fL — ABNORMAL HIGH (ref 80.0–100.0)
Platelets: 144 10*3/uL — ABNORMAL LOW (ref 150–400)
RBC: 2.82 MIL/uL — ABNORMAL LOW (ref 4.22–5.81)
RDW: 19.8 % — ABNORMAL HIGH (ref 11.5–15.5)
WBC: 9.3 10*3/uL (ref 4.0–10.5)
nRBC: 0 % (ref 0.0–0.2)

## 2020-10-19 LAB — HEPATIC FUNCTION PANEL
ALT: 22 U/L (ref 0–44)
AST: 42 U/L — ABNORMAL HIGH (ref 15–41)
Albumin: 2.8 g/dL — ABNORMAL LOW (ref 3.5–5.0)
Alkaline Phosphatase: 124 U/L (ref 38–126)
Bilirubin, Direct: 0.5 mg/dL — ABNORMAL HIGH (ref 0.0–0.2)
Indirect Bilirubin: 1.2 mg/dL — ABNORMAL HIGH (ref 0.3–0.9)
Total Bilirubin: 1.7 mg/dL — ABNORMAL HIGH (ref 0.3–1.2)
Total Protein: 6.2 g/dL — ABNORMAL LOW (ref 6.5–8.1)

## 2020-10-19 LAB — BASIC METABOLIC PANEL
Anion gap: 7 (ref 5–15)
BUN: 75 mg/dL — ABNORMAL HIGH (ref 8–23)
CO2: 28 mmol/L (ref 22–32)
Calcium: 8.6 mg/dL — ABNORMAL LOW (ref 8.9–10.3)
Chloride: 106 mmol/L (ref 98–111)
Creatinine, Ser: 1.44 mg/dL — ABNORMAL HIGH (ref 0.61–1.24)
GFR, Estimated: 51 mL/min — ABNORMAL LOW (ref >60)
Glucose, Bld: 146 mg/dL — ABNORMAL HIGH (ref 70–99)
Potassium: 4.2 mmol/L (ref 3.5–5.1)
Sodium: 141 mmol/L (ref 135–145)

## 2020-10-19 LAB — GLUCOSE, CAPILLARY
Glucose-Capillary: 152 mg/dL — ABNORMAL HIGH (ref 70–99)
Glucose-Capillary: 194 mg/dL — ABNORMAL HIGH (ref 70–99)
Glucose-Capillary: 185 mg/dL — ABNORMAL HIGH (ref 70–99)
Glucose-Capillary: 185 mg/dL — ABNORMAL HIGH (ref 70–99)

## 2020-10-19 LAB — PROTIME-INR
INR: 1.2 (ref 0.8–1.2)
Prothrombin Time: 15.6 seconds — ABNORMAL HIGH (ref 11.4–15.2)

## 2020-10-19 MED ORDER — LIDOCAINE HCL 1 % IJ SOLN
INTRAMUSCULAR | Status: AC | PRN
Start: 2020-10-19 — End: 2020-10-19
  Administered 2020-10-19: 10 mL

## 2020-10-19 MED ORDER — MIDAZOLAM HCL 2 MG/2ML IJ SOLN
INTRAMUSCULAR | Status: AC
  Filled 2020-10-19: qty 2

## 2020-10-19 MED ORDER — FENTANYL CITRATE (PF) 100 MCG/2ML IJ SOLN
INTRAMUSCULAR | Status: AC | PRN
  Administered 2020-10-19: 25 ug via INTRAVENOUS

## 2020-10-19 MED ORDER — SODIUM CHLORIDE 0.9 % IV SOLN
25.0000 ug/h | INTRAVENOUS | Status: AC
  Administered 2020-10-19: 25 ug/h via INTRAVENOUS
  Filled 2020-10-19: qty 1

## 2020-10-19 MED ORDER — IOHEXOL 300 MG/ML  SOLN
100.0000 mL | Freq: Once | INTRAMUSCULAR | Status: AC | PRN
  Administered 2020-10-19: 30 mL

## 2020-10-19 MED ORDER — HYDROXYZINE HCL 25 MG PO TABS: 25.0000 mg | ORAL_TABLET | Freq: Three times a day (TID) | ORAL | Status: DC | PRN

## 2020-10-19 MED ORDER — FENTANYL CITRATE (PF) 100 MCG/2ML IJ SOLN
INTRAMUSCULAR | Status: AC
  Filled 2020-10-19: qty 2

## 2020-10-19 MED ORDER — MIDAZOLAM HCL 2 MG/2ML IJ SOLN
INTRAMUSCULAR | Status: AC | PRN
  Administered 2020-10-19: 1 mg via INTRAVENOUS

## 2020-10-19 MED ORDER — IOHEXOL 300 MG/ML  SOLN
100.0000 mL | Freq: Once | INTRAMUSCULAR | Status: AC | PRN
  Administered 2020-10-19: 50 mL

## 2020-10-19 MED ORDER — LIDOCAINE HCL 1 % IJ SOLN
INTRAMUSCULAR | Status: AC
  Filled 2020-10-19: qty 20

## 2020-10-19 NOTE — Progress Notes (Signed)
IR was requested for image guided GDA embolization yesterday.    Case was reviewed and approved by Dr. Danise Mina, patient was supposed to be transferred and admitted to [facility] after the embolization.  However,  transfer has been delayed as [facility] has no bed for the patient per [facility] LPN.   Discussed with Dr. Pascal Lux and Dr. Montez Morita (GI,) both agree patient can be transferred back to [facility] after the embolization.   Asked attending provider Dr. Roger Shelter via secure chat  to cancel the transfer to [facility] order so that there will be no confusion.   Informed Annalee Genta, asked her to arrange [facility] ASAP.  Will await patient at [facility] IR.  Please call IR for questions and concerns.    Armando Gang Careli Luzader PA-C 10/19/2020 8:45 AM

## 2020-10-19 NOTE — Progress Notes (Signed)
Subjective: Patient doing well this morning, states he is feeling a little better since blood transfusions. He denies shortness of breath, nausea or vomiting. No reports of melena or BRBPR, states no BMs since yesterday morning. Does report some ongoing, mild pain to LLQ, states pain medication provides relief. He endorses this pain was present upon admission and has improved some since. Patient still amenable to transfer to [facility] for IR procedure.   Objective: Vital signs in last 24 hours: Temp:  [97.4 F (36.3 C)-98.4 F (36.9 C)] 98.1 F (36.7 C) (07/27 0512) Pulse Rate:  [54-109] 60 (07/27 0752) Resp:  [8-19] 19 (07/27 0512) BP: (103-126)/(44-97) 111/52 (07/27 0752) SpO2:  [98 %-100 %] 100 % (07/27 0512) Weight:  [90.5 kg] 90.5 kg (07/27 0540) Last BM Date: 10/17/20 General:   Alert and oriented, pleasant Eyes:  No icterus, sclera clear. Conjuctiva pink.  Mouth:  Without lesions, mucosa pink and moist.  Heart:  S1, S2 present, no murmurs noted.  Lungs: Clear to auscultation bilaterally, without wheezing, rales, or rhonchi.  Abdomen:  Bowel sounds present, soft, non-distended. No HSM or hernias noted. No rebound or guarding. No masses appreciated. Mild TTP LLQ. Msk:  Symmetrical without gross deformities. Normal posture. Pulses:  Normal pulses noted. Extremities:  Without clubbing or edema. Neurologic:  Alert and  oriented x4;  grossly normal neurologically. Skin:  Warm and dry, intact without significant lesions.previous pallor has improved.  Psych:  Alert and cooperative. Normal mood and affect.  Intake/Output from previous day: 07/26 0701 - 07/27 0700 In: 753.9 [I.V.:753.9] Out: 950 [Urine:950] Intake/Output this shift: No intake/output data recorded.  Lab Results: Recent Labs    10/17/20 0812 10/18/20 0431 10/19/20 0427  WBC 9.9 10.7* 9.3  HGB 7.3* 9.1* 9.2*  HCT 21.6* 26.7* 28.3*  PLT 122* 128* 144*   BMET Recent Labs    10/17/20 0812 10/18/20 0431  10/19/20 0427  NA 126* 135 141  K 3.9 3.9 4.2  CL 92* 99 106  CO2 25 26 28   GLUCOSE 470* 95 146*  BUN 104* 90* 75*  CREATININE 1.50* 1.39* 1.44*  CALCIUM 8.2* 8.5* 8.6*   LFT Recent Labs    10/17/20 0812  PROT 5.8*  ALBUMIN 2.7*  AST 34  ALT 20  ALKPHOS 146*  BILITOT 1.4*   PT/INR Recent Labs    10/17/20 0812 10/18/20 0431  LABPROT 17.8* 15.8*  INR 1.5* 1.3*    Assessment: John [name] is a pleasant 75 year old male with history of afib, chronically on eliquis, CHF with reduced EF, sortic stenosis s/p TAVR, NASH cirrhosis, who presented to the ED on 7/25 with generalized weakness/fatigue, found to have acute blood loss anemia in setting of melena, Hgb 7.3 initially in ED with positive FOBT, noted tarry black stools on ED MDs rectal exam. Patient received 2 Units PRBCs with improvement of hgb to 9.1.  Hemoglobin up to 9.2 this morning. No further blood transfusions required past initial 2 Units upon admission. No reported melena or BRBPR today. Platelets 144. INR 1.3 yesterday.   Patient underwent EGD yesterday afternoon in attempt to locate source of GI bleed. Patient was found to have one non-bleeding cratered duodenal ulcer with a non bleeding visible vessel (Forrest Class IIa) in duodenal bulb-likely GDA. Lesions approx 1m in at largest point. Areas was successfully injected with 247mof 1:10,000 solution of epinephrine for hemostasis. Two hemostatic clips were attempted to be placed, however, due to fibrotic nature of edges of ulcer, grasping was  not achieved. No bleeding at the end of the procedure. Case was discussed with Interventional radiologist as well as care team here at [facility] in regards to needed transfer to [facility] for mesenteric arteriogram with possible embolization to prevent massive upper GI bleed. Patient agreeable to the transfer and awaiting bed at [facility] at this time.   Recent findings of NASH cirrhosis prior to admission. Aminotransferases WNL on  7/25, Alk phos 146 and total bili 1.4. INR 1.3 yesterday. Will continue to trend HFP and INR. No ascites or jaundice noted on exam. Mentation remains appropriate, however, will continue to monitor for signs of HE.   He reports some ongoing LLQ pain, states this has been present since admission, has improved some. Pain medication provides relief. He has no associated symptoms or aggravating factors.   Plan: -Continue to hold eliquis -Continue NPO status -Continue protonix 41m BID IV -Continue to monitor H&H, transfusions as ordered by Hospitalist -Will continue to monitor HFP and INR -Continue Rocephin 1g IV for SBP prophylaxis -Continue Octreotide infusion IV -Pain medication as ordered by hospitalist -Continue to monitor mental status for changes indicating HE -[facility]    LOS: 2 days    10/19/2020, 8:50 AM  Ranon Coven L. CAlver Sorrow MSN, APRN, AGNP-C Adult-Gerontology Nurse Practitioner [facility]

## 2020-10-19 NOTE — Anesthesia Postprocedure Evaluation (Signed)
Anesthesia Post Note  Patient: ZHAMIR PIRRO  Procedure(s) Performed: ESOPHAGOGASTRODUODENOSCOPY (EGD) WITH PROPOFOL  Patient location during evaluation: Phase II Anesthesia Type: General Level of consciousness: awake Pain management: pain level controlled Vital Signs Assessment: post-procedure vital signs reviewed and stable Respiratory status: spontaneous breathing and respiratory function stable Cardiovascular status: blood pressure returned to baseline and stable Postop Assessment: no headache and no apparent nausea or vomiting Anesthetic complications: no Comments: Late entry   No notable events documented.   Last Vitals:  Vitals:   10/19/20 0512 10/19/20 0752  BP: (!) 103/44 (!) 111/52  Pulse: (!) 109 60  Resp: 19   Temp: 36.7 C   SpO2: 100%     Last Pain:  Vitals:   10/19/20 0823  TempSrc:   PainSc: [place]

## 2020-10-19 NOTE — Sedation Documentation (Signed)
Pt arrived via [facility]. Alert and oriented x4. NPO, consent signed and in chart. Dr Pascal Lux in to see pt. Pt vitals stable on arrival

## 2020-10-19 NOTE — Sedation Documentation (Signed)
Right femoral sheath removed. 12f exoseal closure device deployed. Distal pulses palpable.

## 2020-10-19 NOTE — Progress Notes (Addendum)
History and Physical   Patient: John [name]                            PCP: Erven Colla, DO                    DOB: Aug 21, 1945            DOA: 10/17/2020 VFI:[id number]             DOS: 10/19/2020, 9:43 AM  Patient coming from:   HOME  I have personally reviewed patient's medical records, in electronic medical records, including:  [facility], and care everywhere.   PCP: Erven Colla, DO   Chief complaint; weakness  Subjective:   The patient was seen and examined this morning, hemoglobin stable.  Patient is awake alert, hemodynamically stable no acute distress    EGD 10/18/2020 --- reviewed large ulcer, clipping failed. Plan to transfer to [facility] to the care of interventional geologist for arteriogram and embolization  Patient is agreeable to the plan  History of present illness:    John [name] is a 75 y.o. male with medical history significant of   combined systolic/diastolic congestive heart failure IIIB, A. fib/flutter - w pacemaker on Eliquis, ICM,-cath September 2021, severe aortic valve stenosis -post TAVR February 2022, HTN, HLD, DMII, gout, CKD IIIb...Marland KitchenMarland Kitchen Presented with generalized weaknesses, fall 3 days ago without any injuries patient denies hitting his head or sustaining any physical trauma.   EMS noted orthostatic hypotensive   Patient Denies having: Fever, Chills, Cough, SOB, Chest Pain, Abd pain, N/V/D, headache, dizziness, lightheadedness,  Dysuria, Joint pain, rash, open wounds  ED Course:  Blood pressure 131/65, pulse 82, temperature 97.7 F (36.5 C), temperature source Oral, resp. rate 20, height 6' (1.829 m), weight 88.9 kg, SpO2 100 %..vital Abnormal labs; CBC WBC 9.9, hemoglobin 7.3, hematocrit 21.6, GFR 49, sodium 126, potassium 3.9, BUN 104, creatinine 1.5, glucose 470, Hemoccult positive x2 Influenza A/B, SARS-CoV-2-negative    Assessment / Plan:   Principal Problem:   GIB (gastrointestinal bleeding) Active Problems:    DM type 2 causing vascular disease (HCC)   Mixed hyperlipidemia   Essential hypertension, benign   Atrial flutter (HCC)   Current use of long term anticoagulation   CKD (chronic kidney disease) stage 3, GFR 30-59 ml/min (HCC)   Hyponatremia   Thrombocytopenia (HCC)   Permanent atrial fibrillation (HCC)   S/P TAVR (transcatheter aortic valve replacement)   Polyarticular gout   Anemia  Principal Problem:   GIB (gastrointestinal bleeding) -Hemodynamically stable, status post EGD -In the face of chronic anticoagulation with Eliquis -Hemoccult positive, hemoglobin is 7.3 (hemoglobin was 14.6 on 09/12/2020) >> 9.1 -GI following anticipating EGD -large duodenal ulcer nonbleeding felt the clip.  -Withholding chronic anticoagulation Eliquis,  -Initiating IV Protonix -Status post 2 units of PRBC transfusion 10/17/2020   Addendum status post EGD 10/18/2020... By GI One non-bleeding cratered duodenal ulcer with a nonbleeding visible vessel (Forrest Class IIa) was found in the duodenal bulb - likely the GDA.  The lesion was 20 mm in largest dimension.  Area was successfully injected with 2 mL of a 1:10,000 solution of epinephrine for hemostasis.  For hemostasis, two hemostatic clips were attempted to be placed. Unfortunately, the edges of the ulcer were too fibrotic and friable so no adequate grasping could be achieved. One clip was deployed in proxity to the ulcer to attempt to "narrow the  gap", but this did not help. A second clip was used but not deployed.  There was no bleeding at the end of the procedure.    RECOMMENDATIONS: -Main NPO. - PPI drip. - IR consultation for GDA embolization....Marland Kitchen  - Avoid NSAIDs, including topical Voltaren - Check H. Pylori serology.  10/18/2020 Discussed the finding with a gastroenterologist Dr. Jenetta Downer, and on-call interventional radiologist Patient will be taken to [facility] for interventional radiologist to proceed with arteriogram and embolization    Addendum:  Patient was taken to [facility] on 10/19/2020 to the care of interventional radiologist Dr. Pascal Lux,  who has performed angiogram and embolization, patient tolerated procedure well.   Hemodynamically stable Arrived back to [facility].     Acute on chronic anemia/anemia of chronic disease, exacerbated by current GI bleed -Hemoglobin 7.3, hematocrit 21.6. >>> hemoglobin 9.1 Hemoccult positive -2 units of PRBC transfusion  -Tolerated well, -Not complaining of shortness of breath or chest pain   History of systolic/diastolic congestive heart failure With holding diuretics due to hypotension, hyponatremia, elevated BUN/creatinine -Monitoring daily weight and I's and O's -Stable on room air  -Due to elevated BUN/creatinine, hyponatremia -we will withhold Zaroxolyn, Aldactone, and Demadex for next 24 hours -We will monitor for any volume overload or shortness of breath utilize as needed Lasix  History of severe aortic stenosis-status post TAVR -closely, asymptomatic  Active Problems:  DM type II -hyperglycemia -Home medication of Tyler Aas and Trulicity will be held, [place] q. Weekly taken at) -Checking CBG QA CHS, SSI coverage -Monitoring CBGs stable   Mixed hyperlipidemia -continue statin when tolerating p.o., stable    Essential hypertension, benign -monitoring BP closely, continue Coreg, will continue Aldactone in a.m.  Dysrhythmia-atrial A. fib/flutter/bradycardia - currently paced on a pacemaker   Current use of long term anticoagulation -due to A. fib on Eliquis (holding due to GI bleed) -Cardiology consulted for clearance for procedure. -Stable, cardiology stating Eliquis may be on hold as long as GI bleed is a concern  CKD (chronic kidney disease) stage 3, GFR 30-59 ml/min (HCC)  -BUN 104, creatinine 1.50 >> 1.39  monitoring, avoiding nephrotoxins, gentle IV fluid hydration -BUN/creatinine improving.Marland Kitchen   Hyponatremia -acute on chronic, monitoring, with  holding diuretics, much improved  Thrombocytopenia (HCC) -monitoring closely, will transfuse as needed, stable     Polyarticular gout -Zoom allopurinol, colchicine as needed when tolerating p.o.  Generalized weaknesses/status post fall -Consulting PT OT for evaluation recommendations  Cultures:  -none Antimicrobial: -none  Consults called: GI/cardiologist -------------------------------------------------------------------------------------------------------------------------------------------- DVT prophylaxis: SCD/Compression stockings Code Status:   Code Status: Full Code   Admission status: Patient will be admitted as Inpatient, with a greater than 2 midnight length of stay. Level of care: Med-Surg   Family Communication:  none at bedside  (The above findings and plan of care has been discussed with patient in detail, the patient expressed understanding and agreement of above plan)  --------------------------------------------------------------------------------------------------------------------------------------------------  Disposition Plan:  Anticipated 1-2 days Status is: Inpatient  Remains inpatient appropriate because:Inpatient level of care appropriate due to severity of illness  Dispo: The patient is from: Home              Anticipated d/c is to: Home              Patient currently is not medically stable to d/c.   Difficult to place patient No  Scheduled Meds:  carvedilol  3.125 mg Oral Q breakfast   carvedilol  6.25 mg Oral Q supper   insulin aspart  0-15 Units Subcutaneous TID WC   insulin aspart  10 Units Intravenous Once   sodium chloride flush  3 mL Intravenous Q12H   sodium chloride flush  3 mL Intravenous Q12H   spironolactone  12.5 mg Oral Daily   torsemide  60 mg Oral BID   Continuous Infusions:  sodium chloride     cefTRIAXone (ROCEPHIN)  IV 2 g (10/17/20 1455)   octreotide  (SANDOSTATIN)    IV infusion Stopped (10/18/20 1324)   pantoprazole  8 mg/hr (10/19/20 0500)   PRN Meds:.sodium chloride, acetaminophen **OR** acetaminophen, bisacodyl, HYDROmorphone (DILAUDID) injection, ipratropium, levalbuterol, ondansetron **OR** ondansetron (ZOFRAN) IV, oxyCODONE, senna-docusate, sodium chloride flush, traZODone PRN MEDs: sodium chloride, acetaminophen **OR** acetaminophen, bisacodyl, HYDROmorphone (DILAUDID) injection, ipratropium, levalbuterol, ondansetron **OR** ondansetron (ZOFRAN) IV, oxyCODONE, senna-docusate, sodium chloride flush, traZODone reports that he quit smoking about 34 years ago. His smoking use included cigarettes. He has a 10.00 pack-year smoking history. He has never used smokeless tobacco. He reports previous alcohol use. He reports that he does not use drugs.   Family History  Problem Relation Age of Onset   Heart attack Mother    Hypertension Mother    Heart attack Father    Hypertension Father    Heart attack Brother    Colon cancer Neg Hx     Physical Exam:   Vitals:   10/19/20 0059 10/19/20 0512 10/19/20 0540 10/19/20 0752  BP: (!) 111/57 (!) 103/44  (!) 111/52  Pulse: (!) 58 (!) 109  60  Resp: 19 19    Temp: 97.9 F (36.6 C) 98.1 F (36.7 C)    TempSrc: Oral Oral    SpO2: 100% 100%    Weight:   90.5 kg   Height:       Physical Exam:   General:  Alert, oriented, cooperative, no distress;   HEENT:  Normocephalic, PERRL, otherwise with in Normal limits   Neuro:  CNII-XII intact. , normal motor and sensation, reflexes intact   Lungs:   Clear to auscultation BL, Respirations unlabored, no wheezes / crackles  Cardio:    S1/S2, RRR, No murmure, No Rubs or Gallops   Abdomen:   Soft, non-tender, bowel sounds active all four quadrants,  no guarding or peritoneal signs.  Muscular skeletal:  Limited exam - in bed, able to move all 4 extremities, Normal strength,  2+ pulses,  symmetric, No pitting edema  Skin:  Dry, warm to touch, negative for any Rashes,  Wounds: Please see nursing documentation      Labs on admission:    I have personally reviewed following labs and imaging studies  CBC: Recent Labs  Lab 10/17/20 0812 10/18/20 0431 10/19/20 0427  WBC 9.9 10.7* 9.3  NEUTROABS 7.3  --   --   HGB 7.3* 9.1* 9.2*  HCT 21.6* 26.7* 28.3*  MCV 98.2 96.0 100.4*  PLT 122* 128* 962*   Basic Metabolic Panel: Recent Labs  Lab 10/17/20 0812 10/18/20 0431 10/19/20 0427  NA 126* 135 141  K 3.9 3.9 4.2  CL 92* 99 106  CO2 25 26 28   GLUCOSE 470* 95 146*  BUN 104* 90* 75*  CREATININE 1.50* 1.39* 1.44*  CALCIUM 8.2* 8.5* 8.6*   GFR: Estimated Creatinine Clearance: 49.4 mL/min (A) (by C-G formula based on SCr of 1.44 mg/dL (H)). Liver Function Tests: Recent Labs  Lab 10/17/20 0812  AST 34  ALT 20  ALKPHOS 146*  BILITOT 1.4*  PROT 5.8*  ALBUMIN 2.7*   No results for  input(s): LIPASE, AMYLASE in the last 168 hours. No results for input(s): AMMONIA in the last 168 hours. Coagulation Profile: Recent Labs  Lab 10/17/20 0812 10/18/20 0431  INR 1.5* 1.3*    HbA1C: Recent Labs    10/17/20 1800  HGBA1C 8.2*   CBG: Recent Labs  Lab 10/18/20 1337 10/18/20 1600 10/18/20 1626 10/18/20 1644 10/19/20 0712  GLUCAP 178* 185* 182* 194* 152*    Urine analysis:    Component Value Date/Time   COLORURINE YELLOW 05/02/2020 1449   APPEARANCEUR CLEAR 05/02/2020 1449   LABSPEC 1.005 05/02/2020 1449   PHURINE 7.0 05/02/2020 1449   GLUCOSEU >=500 (A) 05/02/2020 1449   HGBUR NEGATIVE 05/02/2020 1449   BILIRUBINUR NEGATIVE 05/02/2020 1449   KETONESUR NEGATIVE 05/02/2020 1449   PROTEINUR NEGATIVE 05/02/2020 1449   UROBILINOGEN 0.2 01/05/2014 0744   NITRITE NEGATIVE 05/02/2020 1449   LEUKOCYTESUR NEGATIVE 05/02/2020 1449     Radiologic Exams on Admission:   No results found.  EKG:   Independently reviewed.  Orders placed or performed during the hospital encounter of 10/17/20   ED EKG   ED EKG   EKG 12-Lead   EKG 12-Lead   EKG 12-Lead   EKG     -------------------------------------------------------------------------------------------------------------------------------------------------  Time spent: > than  66  Min.   SIGNED: [name], MD, FHM. [facility],  Pager (Please use [email or web address] to page to text)  If 7PM-7AM, please contact night-coverage [email or web address],  10/19/2020, 9:43 AM

## 2020-10-19 NOTE — Consult Note (Signed)
Chief Complaint: Patient was seen in consultation today for mesenteric arteriogram with possible embolization Chief Complaint  Patient presents with   Weakness   at the request of Dr Valarie Merino   Supervising Physician: Sandi Mariscal  Patient Status: [facility] IP  History of Present Illness: John [name] is a 75 y.o. male   Hx CHF; Afib/flutter on Eliquis; AO valve stenosis-post TAVR 2/22 HTN; HLD; DM; CKD3 Admitted with worsening weakness; fell at home 3 days ago Labs showing low hg/hct and hemoccult +x2  EGD yesterday:  Impression:               - Normal esophagus.                           - Erythematous mucosa in the gastric body.                           - Non-bleeding duodenal ulcer with a nonbleeding                           visible vessel ([place] Class IIa). Injected. Clip                           was placed.                           - No specimens collected. Recommendation:           - Return patient to ICU for ongoing care.                           - NPO.                           - PPI drip.                           - IR consultation for GDA embolization. After I finished esophagogastroduodenospy, I personally discussed with interventional radiologist on-call at [facility] the findings and the need to perform GDA embolization to prevent massive upper gastrointestinal bleeding.  It was agreed that the patient needed transfer to [facility] to undergo this intervention.  I discussed this as well with hospitalist at [facility] who will arrange transfer.                  Blood transfusion x 2 Denies hematemesis or bleeding from rectum Hg: 9.2; hct : 28.3 INR 1.3   Case discussed with Dr Maryelizabeth Kaufmann--- he approves mesenteric arteriogram with possible embolization  Pt is to transfer to [facility] from [facility]--- to be admitted to [facility] Still awaiting transfer    Past Medical History:  Diagnosis Date   Atrial flutter ([place]) 12/2010   Admitted with  symptomatic bradycardia (HR 40s), atrial flutter with slow ventricular response 12/2010 + volume overload; AV nodal agents d/c'd and Pradaxa started; RFA in 01/2011   Chronic kidney disease, stage 3b (HCC)    Chronic systolic CHF (congestive heart failure) ([place])    Class 2 severe obesity due to excess calories with serious comorbidity and body mass index (BMI) of 37.0 to 37.9 in adult [facility]) 07/17/2017   Diabetes mellitus  non insulin dependant   Hematoma, chest wall 05/15/2020   Hyperlipidemia    Hypertension    Mitral regurgitation    NASH (nonalcoholic steatohepatitis)    NICM (nonischemic cardiomyopathy) (HCC)    Osteoarthritis    Polyarticular gout 05/15/2020   Presence of permanent cardiac pacemaker    PSVT (paroxysmal supraventricular tachycardia) (HCC)    Possibly atrial flutter   S/P TAVR (transcatheter aortic valve replacement) 05/03/2020   s/p TAVR with a 29 mm Edwards S3U via the subclavian approach with Dr. Angelena Form & Dr. Cyndia Bent    Severe aortic stenosis     Past Surgical History:  Procedure Laterality Date   ATRIAL FLUTTER ABLATION N/A 02/07/2011   Procedure: ATRIAL FLUTTER ABLATION;  Surgeon: Evans Lance, MD;  Location: [facility] CATH LAB;  Service: Cardiovascular;  Laterality: N/A;   BIV UPGRADE N/A 06/08/2020   Procedure: BIV PPM UPGRADE;  Surgeon: Evans Lance, MD;  Location: [place] CV LAB;  Service: Cardiovascular;  Laterality: N/A;   CARDIAC ELECTROPHYSIOLOGY STUDY AND ABLATION  02/07/11   CARDIOVERSION N/A 03/23/2016   Procedure: CARDIOVERSION;  Surgeon: Evans Lance, MD;  Location: [place];  Service: Cardiovascular;  Laterality: N/A;   COLONOSCOPY N/A 04/17/2017   Procedure: COLONOSCOPY;  Surgeon: Rogene Houston, MD;  Location: [facility] ENDO SUITE;  Service: Endoscopy;  Laterality: N/A;  [place] N/A 08/29/2015   Procedure: Pacemaker Implant;  Surgeon: Evans Lance, MD;  Location: [place] CV LAB;  Service: Cardiovascular;   Laterality: N/A;   EP IMPLANTABLE DEVICE N/A 12/07/2015   Procedure: PPM Lead Revision/Repair;  Surgeon: Evans Lance, MD;  Location: [place] CV LAB;  Service: Cardiovascular;  Laterality: N/A;   KNEE ARTHROSCOPY  2005   left   LUMBAR SPINE SURGERY     "I've had 6 ORs 1972 thru 2004"   POLYPECTOMY  04/17/2017   Procedure: POLYPECTOMY;  Surgeon: Rogene Houston, MD;  Location: [facility] ENDO [facility];  Service: Endoscopy;;  transverse colon x3;   RIGHT HEART CATH N/A 04/28/2020   Procedure: RIGHT HEART CATH;  Surgeon: Jolaine Artist, MD;  Location: [place] CV LAB;  Service: Cardiovascular;  Laterality: N/A;   RIGHT/LEFT HEART CATH AND CORONARY ANGIOGRAPHY N/A 12/21/2019   Procedure: RIGHT/LEFT HEART CATH AND CORONARY ANGIOGRAPHY;  Surgeon: Nelva Bush, MD;  Location: [place] CV LAB;  Service: Cardiovascular;  Laterality: N/A;   TEE WITHOUT CARDIOVERSION N/A 11/05/2019   Procedure: TRANSESOPHAGEAL ECHOCARDIOGRAM (TEE) WITH PROPOFOL;  Surgeon: Arnoldo Lenis, MD;  Location: [facility];  Service: Endoscopy;  Laterality: N/A;   TEE WITHOUT CARDIOVERSION N/A 05/03/2020   Procedure: TRANSESOPHAGEAL ECHOCARDIOGRAM (TEE);  Surgeon: Burnell Blanks, MD;  Location: [place];  Service: Open Heart Surgery;  Laterality: N/A;    Allergies: Patient has no known allergies.  Medications: Prior to Admission medications   Medication Sig Start Date End Date Taking? Authorizing Provider  acetaminophen (TYLENOL) 325 MG tablet Take 2 tablets (650 mg total) by mouth every 4 (four) hours as needed for headache or mild pain. 05/15/20  Yes Isaiah Serge, NP  allopurinol (ZYLOPRIM) 300 MG tablet Take 0.5 tablets (150 mg total) by mouth daily. 07/20/20  Yes Bensimhon, Shaune Pascal, MD  apixaban (ELIQUIS) 5 MG TABS tablet Take 1 tablet (5 mg total) by mouth 2 (two) times daily. 05/24/20  Yes Verta Ellen., NP  aspirin 81 MG chewable tablet Chew 1 tablet (81 mg total) by mouth daily. 05/16/20  Yes  Isaiah Serge, NP  benzonatate (TESSALON) 100 MG capsule Take 1 capsule (100 mg total) by mouth 3 (three) times daily as needed for cough. 09/27/20  Yes Evans Lance, MD  carvedilol (COREG) 3.125 MG tablet TAKE 1 TABLET IN THE MORNING AND 2 TABLETS IN THE EVENING Patient taking differently: Take 3.125-6.25 mg by mouth 2 (two) times daily with a meal. 1 tablet in the morning and 2 tablets in the evening 07/20/20  Yes Bensimhon, Shaune Pascal, MD  colchicine 0.6 MG tablet Take 1 tablet (0.6 mg total) by mouth daily as needed. Patient taking differently: Take 0.6 mg by mouth daily as needed (gout). 05/15/20  Yes Isaiah Serge, NP  Dulaglutide (TRULICITY) 1.5 ZO/1.0RU SOPN INJECT 1.5MG (1 PEN) SUBCUTANEOUSLY EVERY WEEK Patient taking differently: Inject 1.5 mg into the skin once a week. 10/05/20  Yes Nida, Marella Chimes, MD  insulin degludec (TRESIBA FLEXTOUCH) 100 UNIT/ML FlexTouch Pen Inject 20 Units into the skin daily. 08/25/20  Yes Nida, Marella Chimes, MD  KLOR-CON M20 20 MEQ tablet TAKE 1.5 TABLETS (30 MEQ TOTAL) BY MOUTH 2 (TWO) TIMES DAILY. Patient taking differently: Take 30 mEq by mouth 2 (two) times daily. 08/08/20  Yes Evans Lance, MD  loratadine (CLARITIN) 10 MG tablet TAKE 1 TABLET BY MOUTH EVERY DAY Patient taking differently: Take 10 mg by mouth daily. 10/03/20  Yes Bensimhon, Shaune Pascal, MD  metolazone (ZAROXOLYN) 2.5 MG tablet Take 1 tablet (2.5 mg total) by mouth as directed. For wight of 200 lb and greater 09/20/20  Yes Bensimhon, Shaune Pascal, MD  Multiple Vitamins-Minerals (ONE-A-DAY MENS 50+) TABS Take 1 tablet by mouth daily with breakfast.   Yes [provider]  spironolactone (ALDACTONE) 25 MG tablet Take 0.5 tablets (12.5 mg total) by mouth daily. 07/20/20  Yes Bensimhon, Shaune Pascal, MD  torsemide (DEMADEX) 20 MG tablet Take 3 tablets (60 mg total) by mouth 2 (two) times daily. 07/28/20  Yes Bensimhon, Shaune Pascal, MD  ACCU-CHEK AVIVA PLUS test strip TEST BLOOD SUGAR TWICE DAILY  BEFORE BREAKFAST AND AT BEDTIME 07/19/20   Cassandria Anger, MD  Insulin Pen Needle (BD PEN NEEDLE NANO U/F) 32G X 4 MM MISC 1 each by Does not apply route 4 (four) times daily. 12/23/18   Cassandria Anger, MD     Family History  Problem Relation Age of Onset   Heart attack Mother    Hypertension Mother    Heart attack Father    Hypertension Father    Heart attack Brother    Colon cancer Neg Hx     Social History   Socioeconomic History   Marital status: Married    Spouse name: Not on file   Number of children: 2   Years of education: Not on file   Highest education level: Not on file  Occupational History   Occupation: Designer, industrial/product    Employer: RETIRED  Tobacco Use   Smoking status: Former    Packs/day: 1.00    Years: 10.00    Pack years: 10.00    Types: Cigarettes    Quit date: 03/26/1986    Years since quitting: 34.5   Smokeless tobacco: Never   Tobacco comments:    "stopped cigarette  smoking 1988"  Vaping Use   Vaping Use: Never used  Substance and Sexual Activity   Alcohol use: Not Currently    Alcohol/week: 0.0 standard drinks    Comment: "quit alcohol ~ 2007"   Drug use: No  Sexual activity: Yes    Partners: Female  Other Topics Concern   Not on file  Social History Narrative   Not on file   Social Determinants of Health   Financial Resource Strain: Not on file  Food Insecurity: No Food Insecurity   Worried About Charity fundraiser in the Last Year: Never true   Ran Out of Food in the Last Year: Never true  Transportation Needs: No Transportation Needs   Lack of Transportation (Medical): No   Lack of Transportation (Non-Medical): No  Physical Activity: Not on file  Stress: Not on file  Social Connections: Not on file    Review of Systems: A 12 point ROS discussed and pertinent positives are indicated in the HPI above.  All other systems are negative.  Review of Systems  Constitutional:  Positive for activity change, appetite  change and fatigue. Negative for fever.  Respiratory:  Negative for cough and shortness of breath.   Cardiovascular:  Negative for chest pain.  Gastrointestinal:  Positive for abdominal pain and blood in stool. Negative for nausea, rectal pain and vomiting.  Neurological:  Positive for weakness.  Psychiatric/Behavioral:  Negative for behavioral problems and confusion.    Vital Signs: BP (!) 111/52   Pulse 60   Temp 98.1 F (36.7 C) (Oral)   Resp 19   Ht 6' (1.829 m)   Wt 199 lb 8.3 oz (90.5 kg)   SpO2 100%   BMI 27.06 kg/m   Physical Exam Vitals reviewed.  HENT:     Mouth/Throat:     Mouth: Mucous membranes are moist.  Cardiovascular:     Rate and Rhythm: Normal rate and regular rhythm.     Heart sounds: Normal heart sounds.  Pulmonary:     Effort: Pulmonary effort is normal.     Breath sounds: Normal breath sounds.  Abdominal:     Palpations: Abdomen is soft.  Musculoskeletal:        General: Normal range of motion.  Skin:    General: Skin is warm.  Neurological:     Mental Status: He is alert and oriented to person, place, and time.  Psychiatric:        Behavior: Behavior normal.    Imaging: DG Chest Port 1 View  Result Date: 10/17/2020 CLINICAL DATA:  Weakness, fall EXAM: PORTABLE CHEST 1 VIEW COMPARISON:  06/08/2020 FINDINGS: Low lung volumes. No consolidation or edema. No pleural effusion or pneumothorax. Stable cardiomediastinal contours. Post TAVR. Left chest wall dual lead pacemaker. IMPRESSION: No acute process in the chest. Electronically Signed   By: Macy Mis M.D.   On: 10/17/2020 08:38   CUP PACEART REMOTE DEVICE CHECK  Result Date: 10/13/2020 Scheduled remote reviewed. Normal device function.  Permanent AF. +OAC per prior report. VRC per histogram.  Next remote 91 days.   Labs:  CBC: Recent Labs    09/12/20 1028 10/17/20 0812 10/18/20 0431 10/19/20 0427  WBC 7.8 9.9 10.7* 9.3  HGB 14.6 7.3* 9.1* 9.2*  HCT 40.9 21.6* 26.7* 28.3*  PLT  138* 122* 128* 144*    COAGS: Recent Labs    03/10/20 0322 04/29/20 0402 05/01/20 0340 05/02/20 0325 05/02/20 1450 09/12/20 1028 10/17/20 0812 10/18/20 0431  INR 2.5*  --   --   --  1.4* 1.1 1.5* 1.3*  APTT 43* 84* 69* 65*  --   --   --   --     BMP: Recent Labs    11/03/19 1231 12/18/19 1011 12/21/19  8264 12/25/19 1583 03/09/20 1544 04/04/20 0934 04/14/20 1059 09/06/20 0952 09/12/20 1028 10/17/20 0812 10/18/20 0431 10/19/20 0427  NA 131* 134*   < > 130*   < > 136   < > 135 135 126* 135 141  K 3.5 3.5   < > 3.5   < > 3.2*   < > 4.8 4.9 3.9 3.9 4.2  CL 96* 95*  --  92*   < > 93*   < > 98 95* 92* 99 106  CO2 24 28  --  27   < > 27   < > 27 23 25 26 28   GLUCOSE 127* 82  --  99   < > 159*   < > 273* 224* 470* 95 146*  BUN 39* 43*  --  54*   < > 29*   < > 29* 19 104* 90* 75*  CALCIUM 8.8* 9.5  --  9.0   < > 9.0   < > 10.0 9.4 8.2* 8.5* 8.6*  CREATININE 1.60* 1.63*  --  1.73*   < > 1.50*   < > 1.48* 1.24 1.50* 1.39* 1.44*  GFRNONAA 42* 41*  --  38*   < > 45*   < > 49*  --  49* 53* 51*  GFRAA 49* 48*  --  44*  --  52*  --   --   --   --   --   --    < > = values in this interval not displayed.    LIVER FUNCTION TESTS: Recent Labs    04/29/20 0402 05/03/20 0305 09/12/20 1028 10/17/20 0812  BILITOT 3.9* 4.1* 2.4* 1.4*  AST 33 28 42* 34  ALT 15 15 28 20   ALKPHOS 127* 118 307* 146*  PROT 5.5* 6.0* 7.3 5.8*  ALBUMIN 2.3* 2.3* 3.7 2.7*    TUMOR MARKERS: No results for input(s): AFPTM, CEA, CA199, CHROMGRNA in the last 8760 hours.  Assessment and Plan:  GI bleed with abnormal EGD with Dr Jenetta Downer Pt is now scheduled to Tx to [facility] for admission and undergo Mesenteric arteriogram with possible embolization Risks and benefits of Mesenteric arteriogram with possible embolization were discussed with the patient including, but not limited to bleeding, infection, vascular injury or contrast induced renal failure.  This interventional procedure involves the use of  X-rays and because of the nature of the planned procedure, it is possible that we will have prolonged use of X-ray fluoroscopy.  Potential radiation risks to you include (but are not limited to) the following: - A slightly elevated risk for cancer  several years later in life. This risk is typically less than 0.5% percent. This risk is low in comparison to the normal incidence of human cancer, which is 33% for women and 50% for men according to the [place]. - Radiation induced injury can include skin redness, resembling a rash, tissue breakdown / ulcers and hair loss (which can be temporary or permanent).   The likelihood of either of these occurring depends on the difficulty of the procedure and whether you are sensitive to radiation due to previous procedures, disease, or genetic conditions.   IF your procedure requires a prolonged use of radiation, you will be notified and given written instructions for further action.  It is your responsibility to monitor the irradiated area for the 2 weeks following the procedure and to notify your physician if you are concerned that you have suffered a radiation induced injury.  All of the patient's questions were answered, patient is agreeable to proceed.  Consent signed and in chart.  Thank you for this interesting consult.  I greatly enjoyed meeting John [name] and look forward to participating in their care.  A copy of this report was sent to the requesting provider on this date.  Electronically Signed: Lavonia Drafts, PA-C 10/19/2020, 7:55 AM   I spent a total of 40 Minutes    in face to face in clinical consultation, greater than 50% of which was counseling/coordinating care for mesenteric arteriogram with possible embolization

## 2020-10-20 ENCOUNTER — Encounter (HOSPITAL_COMMUNITY): Payer: Self-pay | Admitting: Gastroenterology

## 2020-10-20 DIAGNOSIS — K921 Melena: Secondary | ICD-10-CM | POA: Diagnosis not present

## 2020-10-20 DIAGNOSIS — D649 Anemia, unspecified: Secondary | ICD-10-CM | POA: Diagnosis not present

## 2020-10-20 LAB — GLUCOSE, CAPILLARY
Glucose-Capillary: 223 mg/dL — ABNORMAL HIGH (ref 70–99)
Glucose-Capillary: 254 mg/dL — ABNORMAL HIGH (ref 70–99)
Glucose-Capillary: 159 mg/dL — ABNORMAL HIGH (ref 70–99)
Glucose-Capillary: 127 mg/dL — ABNORMAL HIGH (ref 70–99)

## 2020-10-20 LAB — H. PYLORI ANTIBODY, IGG: H Pylori IgG: 0.3 Index Value (ref 0.00–0.79)

## 2020-10-20 LAB — BASIC METABOLIC PANEL
Anion gap: 7 (ref 5–15)
BUN: 52 mg/dL — ABNORMAL HIGH (ref 8–23)
CO2: 26 mmol/L (ref 22–32)
Calcium: 7.9 mg/dL — ABNORMAL LOW (ref 8.9–10.3)
Chloride: 102 mmol/L (ref 98–111)
Creatinine, Ser: 1.38 mg/dL — ABNORMAL HIGH (ref 0.61–1.24)
GFR, Estimated: 54 mL/min — ABNORMAL LOW (ref >60)
Glucose, Bld: 254 mg/dL — ABNORMAL HIGH (ref 70–99)
Potassium: 3.6 mmol/L (ref 3.5–5.1)
Sodium: 135 mmol/L (ref 135–145)

## 2020-10-20 LAB — CBC
HCT: 27.2 % — ABNORMAL LOW (ref 39.0–52.0)
Hemoglobin: 8.9 g/dL — ABNORMAL LOW (ref 13.0–17.0)
MCH: 33.3 pg (ref 26.0–34.0)
MCHC: 32.7 g/dL (ref 30.0–36.0)
MCV: 101.9 fL — ABNORMAL HIGH (ref 80.0–100.0)
Platelets: 116 10*3/uL — ABNORMAL LOW (ref 150–400)
RBC: 2.67 MIL/uL — ABNORMAL LOW (ref 4.22–5.81)
RDW: 19 % — ABNORMAL HIGH (ref 11.5–15.5)
WBC: 8.6 10*3/uL (ref 4.0–10.5)
nRBC: 0 % (ref 0.0–0.2)

## 2020-10-20 NOTE — Progress Notes (Signed)
Notified Dr. Joesph Fillers of patient's left knee pain. It was relieved by the prn pain medication, however, a few hours later patient's pain had returned. Provided another dose of medication. The left knee is not swollen, actually smaller than the right, the only difference being the heat of the left knee. Notified Dr. Joesph Fillers of all findings.

## 2020-10-20 NOTE — Progress Notes (Signed)
History and Physical   Patient: John [name]                            PCP: Erven Colla, DO                    DOB: [birth year]            DOA: 10/17/2020 PZW:[id number]             DOS: 10/20/2020, 6:14 PM  Patient coming from:   HOME  I have personally reviewed patient's medical records, in electronic medical records, including:  [facility], and care everywhere.   PCP: Erven Colla, DO   Chief complaint; weakness  Subjective:   -Wife at bedside, questions answered, complains of left knee pain No fever  Or chills  -Does not recall any trauma  History of present illness:    John [name] is a 75 y.o. male with medical history significant of   combined systolic/diastolic congestive heart failure IIIB, A. fib/flutter - w pacemaker on Eliquis, ICM,-cath September 2021, severe aortic valve stenosis -post TAVR February 2022, HTN, HLD, DMII, gout, CKD IIIb...Marland KitchenMarland Kitchen Presented with generalized weaknesses, fall 3 days ago without any injuries patient denies hitting his head or sustaining any physical trauma.   EMS noted orthostatic hypotensive   Patient Denies having: Fever, Chills, Cough, SOB, Chest Pain, Abd pain, N/V/D, headache, dizziness, lightheadedness,  Dysuria, Joint pain, rash, open wounds  ED Course:  Blood pressure 131/65, pulse 82, temperature 97.7 F (36.5 C), temperature source Oral, resp. rate 20, height 6' (1.829 m), weight 88.9 kg, SpO2 100 %..vital Abnormal labs; CBC WBC 9.9, hemoglobin 7.3, hematocrit 21.6, GFR 49, sodium 126, potassium 3.9, BUN 104, creatinine 1.5, glucose 470, Hemoccult positive x2 Influenza A/B, SARS-CoV-2-negative    Assessment / Plan:   Principal Problem:   GIB (gastrointestinal bleeding) Active Problems:   DM type 2 causing vascular disease (HCC)   Mixed hyperlipidemia   Essential hypertension, benign   Atrial flutter (HCC)   Current use of long term anticoagulation   CKD (chronic kidney disease) stage 3, GFR 30-59  ml/min (HCC)   Hyponatremia   Thrombocytopenia (HCC)   Permanent atrial fibrillation (HCC)   S/P TAVR (transcatheter aortic valve replacement)   Polyarticular gout   Anemia  Principal Problem:   GIB (gastrointestinal bleeding) -Hemodynamically stable, status post EGD -In the face of chronic anticoagulation with Eliquis -Hemoccult positive,  -hemoglobin was 14.6 on 09/12/2020 -Withholding chronic anticoagulation Eliquis,  -Continue IV Protonix -Status post 2 units of PRBC transfusion 10/17/2020 EGD revealed- One non-bleeding cratered duodenal ulcer with a nonbleeding visible vessel (Forrest Class IIa) was found in the duodenal bulb - likely the GDA.  The lesion was 20 mm in largest dimension.  Area was successfully injected with 2 mL of a 1:10,000 solution of epinephrine for hemostasis.  For hemostasis, two hemostatic clips were attempted to be placed. Unfortunately, the edges of the ulcer were too fibrotic and friable so no adequate grasping could be achieved. One clip was deployed in proxity to the ulcer to attempt to "narrow the gap", but this did not help. A second clip was used but not deployed.  There was no bleeding at the end of the procedure.    -Status post IR arteriogram and embolization on 10/19/2020 by Dr. Pascal Lux -Continue PPI continue to monitor H&H  Acute on chronic anemia/anemia of chronic disease, exacerbated by current  GI bleed -hemoglobin was 14.6 on 09/12/2020 Hemoccult positive -2 units of PRBC transfusion this admission -Monitor and transfuse as clinically indicated  History of systolic/diastolic congestive heart failure With holding diuretics due to hypotension, hyponatremia, elevated BUN/creatinine -Monitoring daily weight and I's and O's -Stable on room air  -Due to elevated BUN/creatinine, hyponatremia -we will withhold Zaroxolyn, Aldactone, and Demadex for next 24 hours -We will monitor for any volume overload or shortness of breath utilize as needed  Lasix  History of severe aortic stenosis-status post TAVR -closely, asymptomatic  Active Problems:  DM type II -hyperglycemia -Home medication of Tyler Aas and Trulicity will be held, [place] q. Weekly taken at) -Checking CBG QA CHS, SSI coverage -Monitoring CBGs stable   Mixed hyperlipidemia -continue statin when tolerating p.o., stable    Essential hypertension, benign -monitoring BP closely, continue Coreg, will continue Aldactone in a.m.  Dysrhythmia-atrial A. fib/flutter/bradycardia - currently paced on a pacemaker   Current use of long term anticoagulation -due to A. fib on Eliquis (holding due to GI bleed) -Cardiology consulted for clearance for procedure. -Stable, cardiology stating Eliquis may be on hold as long as GI bleed is a concern  CKD (chronic kidney disease) stage 3, GFR 30-59 ml/min (HCC)  -BUN 104, creatinine 1.50 >> 1.39  monitoring, avoiding nephrotoxins, gentle IV fluid hydration -BUN/creatinine improving.Marland Kitchen   Hyponatremia -acute on chronic, monitoring, with holding diuretics, much improved  Thrombocytopenia (HCC) -monitoring closely, will transfuse as needed, stable     Polyarticular gout -Zoom allopurinol, colchicine as needed when tolerating p.o.  Generalized weaknesses/status post fall -Consulting PT OT for evaluation recommendations  -Left knee pain--- on exam left knee is warm, unable to treat with NSAIDs or steroids due to GI bleed -Opiates as prescribed for now  Cultures:  -none Antimicrobial: -none  Consults called: GI/cardiologist/IR -------------------------------------------------------------------------------------------------------------------------------------------- DVT prophylaxis: SCD/Compression stockings Code Status:   Code Status: Full Code   Admission status: Patient will be admitted as Inpatient, with a greater than 2 midnight length of stay. Level of care: Med-Surg   Family Communication: Discussed with wife at bedside  --------------------------------------------------------------------------------------------------------------------------------------------------  Disposition Plan:  Anticipated 1-2 days Status is: Inpatient  Remains inpatient appropriate because:Inpatient level of care appropriate due to severity of illness  Dispo: The patient is from: Home              Anticipated d/c is to: Home              Patient currently is not medically stable to d/c.   Difficult to place patient No  Scheduled Meds:  carvedilol  3.125 mg Oral Q breakfast   carvedilol  6.25 mg Oral Q supper   insulin aspart  0-15 Units Subcutaneous TID WC   insulin aspart  10 Units Intravenous Once   sodium chloride flush  3 mL Intravenous Q12H   sodium chloride flush  3 mL Intravenous Q12H   spironolactone  12.5 mg Oral Daily   torsemide  60 mg Oral BID   Continuous Infusions:  sodium chloride     cefTRIAXone (ROCEPHIN)  IV 2 g (10/20/20 1445)   pantoprazole 8 mg/hr (10/19/20 0500)   PRN Meds:.sodium chloride, acetaminophen **OR** acetaminophen, bisacodyl, HYDROmorphone (DILAUDID) injection, hydrOXYzine, ipratropium, levalbuterol, ondansetron **OR** ondansetron (ZOFRAN) IV, oxyCODONE, senna-docusate, sodium chloride flush, traZODone PRN MEDs: sodium chloride, acetaminophen **OR** acetaminophen, bisacodyl, HYDROmorphone (DILAUDID) injection, hydrOXYzine, ipratropium, levalbuterol, ondansetron **OR** ondansetron (ZOFRAN) IV, oxyCODONE, senna-docusate, sodium chloride flush, traZODone reports that he quit smoking about 34 years ago. His smoking use included  cigarettes. He has a 10.00 pack-year smoking history. He has never used smokeless tobacco. He reports previous alcohol use. He reports that he does not use drugs.   Family History  Problem Relation Age of Onset   Heart attack Mother    Hypertension Mother    Heart attack Father    Hypertension Father    Heart attack Brother    Colon cancer Neg Hx     Physical Exam:    Vitals:   10/20/20 0019 10/20/20 0401 10/20/20 0540 10/20/20 1315  BP: (!) 124/52 (!) 102/47  99/64  Pulse: (!) 59 (!) 57  60  Resp: _0 Temp: 97.8 F (36.6 C) (!) 97.3 F (36.3 C)  97.7 F (36.5 C)  TempSrc: Oral Oral  Oral  SpO2: 100% 100%  97%  Weight:   88.5 kg   Height:       Physical Exam:  Physical Exam  Gen:- Awake Alert,  in no apparent distress  HEENT:- [place].AT, No sclera icterus Neck-Supple Neck,No JVD,.  Lungs-  CTAB , fair air movement CV- S1, S2 normal Abd-  +ve B.Sounds, Abd Soft, No tenderness,    Extremity/Skin:- No  edema,   good pulses Psych-affect is appropriate, oriented x3 Neuro-no new focal deficits, no tremors MSK--left knee warmth and tenderness on exam, tenderness with range of motion, somewhat stiff, difficulty with flexion     Labs on admission:    I have personally reviewed following labs and imaging studies  CBC: Recent Labs  Lab 10/17/20 0812 10/18/20 0431 10/19/20 0427 10/20/20 0629  WBC 9.9 10.7* 9.3 8.6  NEUTROABS 7.3  --   --   --   HGB 7.3* 9.1* 9.2* 8.9*  HCT 21.6* 26.7* 28.3* 27.2*  MCV 98.2 96.0 100.4* 101.9*  PLT 122* 128* 144* 197*   Basic Metabolic Panel: Recent Labs  Lab 10/17/20 0812 10/18/20 0431 10/19/20 0427 10/20/20 0629  NA 126* 135 141 135  K 3.9 3.9 4.2 3.6  CL 92* 99 106 102  CO2 _1 GLUCOSE 470* 95 146* 254*  BUN 104* 90* 75* 52*  CREATININE 1.50* 1.39* 1.44* 1.38*  CALCIUM 8.2* 8.5* 8.6* 7.9*   GFR: Estimated Creatinine Clearance: 51.5 mL/min (A) (by C-G formula based on SCr of 1.38 mg/dL (H)). Liver Function Tests: Recent Labs  Lab 10/17/20 0812 10/19/20 1850  AST 34 42*  ALT 20 22  ALKPHOS 146* 124  BILITOT 1.4* 1.7*  PROT 5.8* 6.2*  ALBUMIN 2.7* 2.8*   No results for input(s): LIPASE, AMYLASE in the last 168 hours. No results for input(s): AMMONIA in the last 168 hours. Coagulation Profile: Recent Labs  Lab 10/17/20 0812 10/18/20 0431 10/19/20 1850  INR 1.5*  1.3* 1.2    HbA1C: No results for input(s): HGBA1C in the last 72 hours.  CBG: Recent Labs  Lab 10/19/20 1458 10/19/20 1615 10/20/20 0725 10/20/20 1128 10/20/20 1554  GLUCAP 185* 185* 223* 254* 159*    Urine analysis:    Component Value Date/Time   COLORURINE YELLOW 05/02/2020 [place] 05/02/2020 1449   LABSPEC 1.005 05/02/2020 1449   PHURINE 7.0 05/02/2020 1449   GLUCOSEU >=500 (A) 05/02/2020 1449   HGBUR NEGATIVE 05/02/2020 1449   BILIRUBINUR NEGATIVE 05/02/2020 1449   KETONESUR NEGATIVE 05/02/2020 1449   PROTEINUR NEGATIVE 05/02/2020 1449   UROBILINOGEN 0.2 01/05/2014 0744   NITRITE NEGATIVE 05/02/2020 1449   LEUKOCYTESUR NEGATIVE 05/02/2020 1449     Radiologic Exams  on Admission:   IR Angiogram Visceral Selective  Result Date: 10/19/2020 INDICATION: Bleeding duodenal ulcer. Please perform mesenteric arteriogram and percutaneous embolization as indicated. EXAM: 1. ULTRASOUND GUIDANCE FOR ARTERIAL ACCESS 2. SELECTIVE SUPERIOR MESENTERIC ARTERIOGRAM 3. SELECTIVE CELIAC ARTERIOGRAM 4. SUB SELECTIVE COMMON HEPATIC ARTERIOGRAM 5. SUB SELECTIVE GASTRODUODENAL ARTERIOGRAM AND PERCUTANEOUS COIL EMBOLIZATION COMPARISON:  CT the chest, abdomen and pelvis-04/30/2020; 04/20/2020 MEDICATIONS: None ANESTHESIA/SEDATION: Moderate (conscious) sedation was employed during this procedure. A total of Versed 1 mg and Fentanyl 25 mcg was administered intravenously. Moderate Sedation Time: 62 minutes. The patient's level of consciousness and vital signs were monitored continuously by radiology nursing throughout the procedure under my direct supervision. CONTRAST:  80 cc Omnipaque 300 FLUOROSCOPY TIME:  19 minutes, 12 seconds (923 mGy) COMPLICATIONS: None immediate. PROCEDURE: Informed consent was obtained from the patient following explanation of the procedure, risks, benefits and alternatives. All questions were addressed. A time out was performed prior to the initiation of the  procedure. Maximal barrier sterile technique utilized including caps, mask, sterile gowns, sterile gloves, large sterile drape, hand hygiene, and Betadine prep. The right femoral head was marked fluoroscopically. Under sterile conditions and local anesthesia, the right common femoral artery access was performed with a micropuncture needle. Under direct ultrasound guidance, the right common femoral was accessed with a micropuncture kit. An ultrasound image was saved for documentation purposes. This allowed for placement of a 5-French vascular sheath. A limited arteriogram was performed through the side arm of the sheath confirming appropriate access within the right common femoral artery. Over a Bentson wire, a Mickelson catheter was advanced the caudal aspect of the thoracic aorta where was reformed, back bled and flushed. The Mickelson catheter was then utilized to select the celiac artery and a celiac arteriogram was performed. Next, with the use of a fathom 14 microwire, a regular Renegade microcatheter was advanced to the level of gastroduodenal artery and a selective gastroduodenal arteriogram was performed The microcatheter was advanced beyond the placed endoscopic clips and the gastroduodenal artery was percutaneously coil embolized with multiple 3 mm, 4 mm, 5 mm, 6 mm and 10 mm interlock coils to near the vessel's origin. Multiple sub selective injections were performed during the percutaneous coil embolization Next, the microcatheter was retracted to the level of the common hepatic artery and a selective common hepatic arteriogram was performed. Next, the microcatheter was removed and a completion celiac arteriogram was performed to the [facility] catheter. The Mickelson catheter was then utilized to select the superior mesenteric artery and a selective superior mesenteric arteriogram was performed. Images were reviewed and the procedure was terminated. All wires, catheters and sheaths were removed from the  patient. Hemostasis was achieved at the right groin access site with deployment of an ExoSeal closure device. The patient tolerated the procedure well without immediate post procedural complication. FINDINGS: Selective celiac arteriogram demonstrates a conventional branching pattern with conventional takeoff of the GDA which terminates in a right gastroepiploic artery. Selective gastroduodenal arteriogram confirms this finding and demonstrates several tiny pancreaticoduodenal branches contributing arterial supply to region adjacent to the endoscopy clips. A discrete area of contrast extravasation or vessel irregularity is not identified, however the decision was made to proceed with prophylactic embolization of the GDA which was performed with multiple overlapping coils to near the vessel's origin. Following percutaneous embolization, there is complete occlusion of GDA. Selective superior mesenteric arteriogram is negative for definitive retrograde supply to endoscopy clip. IMPRESSION: Technically successful percutaneous prophylactic coil embolization of the GDA for bleeding duodenal ulcer  at the location of the endoscopy clip. PLAN: - The patient is to remain flat for 4 hours with right leg straight. - Continued resuscitative interventions is advised as the patient may continue to experience several additional bloody bowel movements, however ultimately I am hopeful he will stabilize in the coming days. - Repeat endoscopy may be performed at discretion of the GI service as Electronically Signed   By: Sandi Mariscal M.D.   On: 10/19/2020 16:25   IR Angiogram Visceral Selective  Result Date: 10/19/2020 INDICATION: Bleeding duodenal ulcer. Please perform mesenteric arteriogram and percutaneous embolization as indicated. EXAM: 1. ULTRASOUND GUIDANCE FOR ARTERIAL ACCESS 2. SELECTIVE SUPERIOR MESENTERIC ARTERIOGRAM 3. SELECTIVE CELIAC ARTERIOGRAM 4. SUB SELECTIVE COMMON HEPATIC ARTERIOGRAM 5. SUB SELECTIVE GASTRODUODENAL  ARTERIOGRAM AND PERCUTANEOUS COIL EMBOLIZATION COMPARISON:  CT the chest, abdomen and pelvis-04/30/2020; 04/20/2020 MEDICATIONS: None ANESTHESIA/SEDATION: Moderate (conscious) sedation was employed during this procedure. A total of Versed 1 mg and Fentanyl 25 mcg was administered intravenously. Moderate Sedation Time: 62 minutes. The patient's level of consciousness and vital signs were monitored continuously by radiology nursing throughout the procedure under my direct supervision. CONTRAST:  80 cc Omnipaque 300 FLUOROSCOPY TIME:  19 minutes, 12 seconds (786 mGy) COMPLICATIONS: None immediate. PROCEDURE: Informed consent was obtained from the patient following explanation of the procedure, risks, benefits and alternatives. All questions were addressed. A time out was performed prior to the initiation of the procedure. Maximal barrier sterile technique utilized including caps, mask, sterile gowns, sterile gloves, large sterile drape, hand hygiene, and Betadine prep. The right femoral head was marked fluoroscopically. Under sterile conditions and local anesthesia, the right common femoral artery access was performed with a micropuncture needle. Under direct ultrasound guidance, the right common femoral was accessed with a micropuncture kit. An ultrasound image was saved for documentation purposes. This allowed for placement of a 5-French vascular sheath. A limited arteriogram was performed through the side arm of the sheath confirming appropriate access within the right common femoral artery. Over a Bentson wire, a Mickelson catheter was advanced the caudal aspect of the thoracic aorta where was reformed, back bled and flushed. The Mickelson catheter was then utilized to select the celiac artery and a celiac arteriogram was performed. Next, with the use of a fathom 14 microwire, a regular Renegade microcatheter was advanced to the level of gastroduodenal artery and a selective gastroduodenal arteriogram was performed  The microcatheter was advanced beyond the placed endoscopic clips and the gastroduodenal artery was percutaneously coil embolized with multiple 3 mm, 4 mm, 5 mm, 6 mm and 10 mm interlock coils to near the vessel's origin. Multiple sub selective injections were performed during the percutaneous coil embolization Next, the microcatheter was retracted to the level of the common hepatic artery and a selective common hepatic arteriogram was performed. Next, the microcatheter was removed and a completion celiac arteriogram was performed to the [facility] catheter. The Mickelson catheter was then utilized to select the superior mesenteric artery and a selective superior mesenteric arteriogram was performed. Images were reviewed and the procedure was terminated. All wires, catheters and sheaths were removed from the patient. Hemostasis was achieved at the right groin access site with deployment of an ExoSeal closure device. The patient tolerated the procedure well without immediate post procedural complication. FINDINGS: Selective celiac arteriogram demonstrates a conventional branching pattern with conventional takeoff of the GDA which terminates in a right gastroepiploic artery. Selective gastroduodenal arteriogram confirms this finding and demonstrates several tiny pancreaticoduodenal branches contributing arterial supply to region adjacent  to the endoscopy clips. A discrete area of contrast extravasation or vessel irregularity is not identified, however the decision was made to proceed with prophylactic embolization of the GDA which was performed with multiple overlapping coils to near the vessel's origin. Following percutaneous embolization, there is complete occlusion of GDA. Selective superior mesenteric arteriogram is negative for definitive retrograde supply to endoscopy clip. IMPRESSION: Technically successful percutaneous prophylactic coil embolization of the GDA for bleeding duodenal ulcer at the location of the  endoscopy clip. PLAN: - The patient is to remain flat for 4 hours with right leg straight. - Continued resuscitative interventions is advised as the patient may continue to experience several additional bloody bowel movements, however ultimately I am hopeful he will stabilize in the coming days. - Repeat endoscopy may be performed at discretion of the GI service as Electronically Signed   By: Sandi Mariscal M.D.   On: 10/19/2020 16:25   IR Angiogram Selective Each Additional Vessel  Result Date: 10/19/2020 INDICATION: Bleeding duodenal ulcer. Please perform mesenteric arteriogram and percutaneous embolization as indicated. EXAM: 1. ULTRASOUND GUIDANCE FOR ARTERIAL ACCESS 2. SELECTIVE SUPERIOR MESENTERIC ARTERIOGRAM 3. SELECTIVE CELIAC ARTERIOGRAM 4. SUB SELECTIVE COMMON HEPATIC ARTERIOGRAM 5. SUB SELECTIVE GASTRODUODENAL ARTERIOGRAM AND PERCUTANEOUS COIL EMBOLIZATION COMPARISON:  CT the chest, abdomen and pelvis-04/30/2020; 04/20/2020 MEDICATIONS: None ANESTHESIA/SEDATION: Moderate (conscious) sedation was employed during this procedure. A total of Versed 1 mg and Fentanyl 25 mcg was administered intravenously. Moderate Sedation Time: 62 minutes. The patient's level of consciousness and vital signs were monitored continuously by radiology nursing throughout the procedure under my direct supervision. CONTRAST:  80 cc Omnipaque 300 FLUOROSCOPY TIME:  19 minutes, 12 seconds (478 mGy) COMPLICATIONS: None immediate. PROCEDURE: Informed consent was obtained from the patient following explanation of the procedure, risks, benefits and alternatives. All questions were addressed. A time out was performed prior to the initiation of the procedure. Maximal barrier sterile technique utilized including caps, mask, sterile gowns, sterile gloves, large sterile drape, hand hygiene, and Betadine prep. The right femoral head was marked fluoroscopically. Under sterile conditions and local anesthesia, the right common femoral artery  access was performed with a micropuncture needle. Under direct ultrasound guidance, the right common femoral was accessed with a micropuncture kit. An ultrasound image was saved for documentation purposes. This allowed for placement of a 5-French vascular sheath. A limited arteriogram was performed through the side arm of the sheath confirming appropriate access within the right common femoral artery. Over a Bentson wire, a Mickelson catheter was advanced the caudal aspect of the thoracic aorta where was reformed, back bled and flushed. The Mickelson catheter was then utilized to select the celiac artery and a celiac arteriogram was performed. Next, with the use of a fathom 14 microwire, a regular Renegade microcatheter was advanced to the level of gastroduodenal artery and a selective gastroduodenal arteriogram was performed The microcatheter was advanced beyond the placed endoscopic clips and the gastroduodenal artery was percutaneously coil embolized with multiple 3 mm, 4 mm, 5 mm, 6 mm and 10 mm interlock coils to near the vessel's origin. Multiple sub selective injections were performed during the percutaneous coil embolization Next, the microcatheter was retracted to the level of the common hepatic artery and a selective common hepatic arteriogram was performed. Next, the microcatheter was removed and a completion celiac arteriogram was performed to the [facility] catheter. The Mickelson catheter was then utilized to select the superior mesenteric artery and a selective superior mesenteric arteriogram was performed. Images were reviewed and the procedure  was terminated. All wires, catheters and sheaths were removed from the patient. Hemostasis was achieved at the right groin access site with deployment of an ExoSeal closure device. The patient tolerated the procedure well without immediate post procedural complication. FINDINGS: Selective celiac arteriogram demonstrates a conventional branching pattern with  conventional takeoff of the GDA which terminates in a right gastroepiploic artery. Selective gastroduodenal arteriogram confirms this finding and demonstrates several tiny pancreaticoduodenal branches contributing arterial supply to region adjacent to the endoscopy clips. A discrete area of contrast extravasation or vessel irregularity is not identified, however the decision was made to proceed with prophylactic embolization of the GDA which was performed with multiple overlapping coils to near the vessel's origin. Following percutaneous embolization, there is complete occlusion of GDA. Selective superior mesenteric arteriogram is negative for definitive retrograde supply to endoscopy clip. IMPRESSION: Technically successful percutaneous prophylactic coil embolization of the GDA for bleeding duodenal ulcer at the location of the endoscopy clip. PLAN: - The patient is to remain flat for 4 hours with right leg straight. - Continued resuscitative interventions is advised as the patient may continue to experience several additional bloody bowel movements, however ultimately I am hopeful he will stabilize in the coming days. - Repeat endoscopy may be performed at discretion of the GI service as Electronically Signed   By: Sandi Mariscal M.D.   On: 10/19/2020 16:25   IR Angiogram Selective Each Additional Vessel  Result Date: 10/19/2020 INDICATION: Bleeding duodenal ulcer. Please perform mesenteric arteriogram and percutaneous embolization as indicated. EXAM: 1. ULTRASOUND GUIDANCE FOR ARTERIAL ACCESS 2. SELECTIVE SUPERIOR MESENTERIC ARTERIOGRAM 3. SELECTIVE CELIAC ARTERIOGRAM 4. SUB SELECTIVE COMMON HEPATIC ARTERIOGRAM 5. SUB SELECTIVE GASTRODUODENAL ARTERIOGRAM AND PERCUTANEOUS COIL EMBOLIZATION COMPARISON:  CT the chest, abdomen and pelvis-04/30/2020; 04/20/2020 MEDICATIONS: None ANESTHESIA/SEDATION: Moderate (conscious) sedation was employed during this procedure. A total of Versed 1 mg and Fentanyl 25 mcg was  administered intravenously. Moderate Sedation Time: 62 minutes. The patient's level of consciousness and vital signs were monitored continuously by radiology nursing throughout the procedure under my direct supervision. CONTRAST:  80 cc Omnipaque 300 FLUOROSCOPY TIME:  19 minutes, 12 seconds (329 mGy) COMPLICATIONS: None immediate. PROCEDURE: Informed consent was obtained from the patient following explanation of the procedure, risks, benefits and alternatives. All questions were addressed. A time out was performed prior to the initiation of the procedure. Maximal barrier sterile technique utilized including caps, mask, sterile gowns, sterile gloves, large sterile drape, hand hygiene, and Betadine prep. The right femoral head was marked fluoroscopically. Under sterile conditions and local anesthesia, the right common femoral artery access was performed with a micropuncture needle. Under direct ultrasound guidance, the right common femoral was accessed with a micropuncture kit. An ultrasound image was saved for documentation purposes. This allowed for placement of a 5-French vascular sheath. A limited arteriogram was performed through the side arm of the sheath confirming appropriate access within the right common femoral artery. Over a Bentson wire, a Mickelson catheter was advanced the caudal aspect of the thoracic aorta where was reformed, back bled and flushed. The Mickelson catheter was then utilized to select the celiac artery and a celiac arteriogram was performed. Next, with the use of a fathom 14 microwire, a regular Renegade microcatheter was advanced to the level of gastroduodenal artery and a selective gastroduodenal arteriogram was performed The microcatheter was advanced beyond the placed endoscopic clips and the gastroduodenal artery was percutaneously coil embolized with multiple 3 mm, 4 mm, 5 mm, 6 mm and 10 mm interlock coils  to near the vessel's origin. Multiple sub selective injections were  performed during the percutaneous coil embolization Next, the microcatheter was retracted to the level of the common hepatic artery and a selective common hepatic arteriogram was performed. Next, the microcatheter was removed and a completion celiac arteriogram was performed to the [facility] catheter. The Mickelson catheter was then utilized to select the superior mesenteric artery and a selective superior mesenteric arteriogram was performed. Images were reviewed and the procedure was terminated. All wires, catheters and sheaths were removed from the patient. Hemostasis was achieved at the right groin access site with deployment of an ExoSeal closure device. The patient tolerated the procedure well without immediate post procedural complication. FINDINGS: Selective celiac arteriogram demonstrates a conventional branching pattern with conventional takeoff of the GDA which terminates in a right gastroepiploic artery. Selective gastroduodenal arteriogram confirms this finding and demonstrates several tiny pancreaticoduodenal branches contributing arterial supply to region adjacent to the endoscopy clips. A discrete area of contrast extravasation or vessel irregularity is not identified, however the decision was made to proceed with prophylactic embolization of the GDA which was performed with multiple overlapping coils to near the vessel's origin. Following percutaneous embolization, there is complete occlusion of GDA. Selective superior mesenteric arteriogram is negative for definitive retrograde supply to endoscopy clip. IMPRESSION: Technically successful percutaneous prophylactic coil embolization of the GDA for bleeding duodenal ulcer at the location of the endoscopy clip. PLAN: - The patient is to remain flat for 4 hours with right leg straight. - Continued resuscitative interventions is advised as the patient may continue to experience several additional bloody bowel movements, however ultimately I am hopeful he  will stabilize in the coming days. - Repeat endoscopy may be performed at discretion of the GI service as Electronically Signed   By: Sandi Mariscal M.D.   On: 10/19/2020 16:25   IR US Guide Vasc Access Right  Result Date: 10/19/2020 INDICATION: Bleeding duodenal ulcer. Please perform mesenteric arteriogram and percutaneous embolization as indicated. EXAM: 1. ULTRASOUND GUIDANCE FOR ARTERIAL ACCESS 2. SELECTIVE SUPERIOR MESENTERIC ARTERIOGRAM 3. SELECTIVE CELIAC ARTERIOGRAM 4. SUB SELECTIVE COMMON HEPATIC ARTERIOGRAM 5. SUB SELECTIVE GASTRODUODENAL ARTERIOGRAM AND PERCUTANEOUS COIL EMBOLIZATION COMPARISON:  CT the chest, abdomen and pelvis-04/30/2020; 04/20/2020 MEDICATIONS: None ANESTHESIA/SEDATION: Moderate (conscious) sedation was employed during this procedure. A total of Versed 1 mg and Fentanyl 25 mcg was administered intravenously. Moderate Sedation Time: 62 minutes. The patient's level of consciousness and vital signs were monitored continuously by radiology nursing throughout the procedure under my direct supervision. CONTRAST:  80 cc Omnipaque 300 FLUOROSCOPY TIME:  19 minutes, 12 seconds (220 mGy) COMPLICATIONS: None immediate. PROCEDURE: Informed consent was obtained from the patient following explanation of the procedure, risks, benefits and alternatives. All questions were addressed. A time out was performed prior to the initiation of the procedure. Maximal barrier sterile technique utilized including caps, mask, sterile gowns, sterile gloves, large sterile drape, hand hygiene, and Betadine prep. The right femoral head was marked fluoroscopically. Under sterile conditions and local anesthesia, the right common femoral artery access was performed with a micropuncture needle. Under direct ultrasound guidance, the right common femoral was accessed with a micropuncture kit. An ultrasound image was saved for documentation purposes. This allowed for placement of a 5-French vascular sheath. A limited  arteriogram was performed through the side arm of the sheath confirming appropriate access within the right common femoral artery. Over a Bentson wire, a Mickelson catheter was advanced the caudal aspect of the thoracic aorta where was reformed, back  bled and flushed. The Mickelson catheter was then utilized to select the celiac artery and a celiac arteriogram was performed. Next, with the use of a fathom 14 microwire, a regular Renegade microcatheter was advanced to the level of gastroduodenal artery and a selective gastroduodenal arteriogram was performed The microcatheter was advanced beyond the placed endoscopic clips and the gastroduodenal artery was percutaneously coil embolized with multiple 3 mm, 4 mm, 5 mm, 6 mm and 10 mm interlock coils to near the vessel's origin. Multiple sub selective injections were performed during the percutaneous coil embolization Next, the microcatheter was retracted to the level of the common hepatic artery and a selective common hepatic arteriogram was performed. Next, the microcatheter was removed and a completion celiac arteriogram was performed to the [facility] catheter. The Mickelson catheter was then utilized to select the superior mesenteric artery and a selective superior mesenteric arteriogram was performed. Images were reviewed and the procedure was terminated. All wires, catheters and sheaths were removed from the patient. Hemostasis was achieved at the right groin access site with deployment of an ExoSeal closure device. The patient tolerated the procedure well without immediate post procedural complication. FINDINGS: Selective celiac arteriogram demonstrates a conventional branching pattern with conventional takeoff of the GDA which terminates in a right gastroepiploic artery. Selective gastroduodenal arteriogram confirms this finding and demonstrates several tiny pancreaticoduodenal branches contributing arterial supply to region adjacent to the endoscopy clips. A  discrete area of contrast extravasation or vessel irregularity is not identified, however the decision was made to proceed with prophylactic embolization of the GDA which was performed with multiple overlapping coils to near the vessel's origin. Following percutaneous embolization, there is complete occlusion of GDA. Selective superior mesenteric arteriogram is negative for definitive retrograde supply to endoscopy clip. IMPRESSION: Technically successful percutaneous prophylactic coil embolization of the GDA for bleeding duodenal ulcer at the location of the endoscopy clip. PLAN: - The patient is to remain flat for 4 hours with right leg straight. - Continued resuscitative interventions is advised as the patient may continue to experience several additional bloody bowel movements, however ultimately I am hopeful he will stabilize in the coming days. - Repeat endoscopy may be performed at discretion of the GI service as Electronically Signed   By: Sandi Mariscal M.D.   On: 10/19/2020 16:25   IR EMBO ART  VEN HEMORR LYMPH EXTRAV  INC GUIDE ROADMAPPING  Result Date: 10/19/2020 INDICATION: Bleeding duodenal ulcer. Please perform mesenteric arteriogram and percutaneous embolization as indicated. EXAM: 1. ULTRASOUND GUIDANCE FOR ARTERIAL ACCESS 2. SELECTIVE SUPERIOR MESENTERIC ARTERIOGRAM 3. SELECTIVE CELIAC ARTERIOGRAM 4. SUB SELECTIVE COMMON HEPATIC ARTERIOGRAM 5. SUB SELECTIVE GASTRODUODENAL ARTERIOGRAM AND PERCUTANEOUS COIL EMBOLIZATION COMPARISON:  CT the chest, abdomen and pelvis-04/30/2020; 04/20/2020 MEDICATIONS: None ANESTHESIA/SEDATION: Moderate (conscious) sedation was employed during this procedure. A total of Versed 1 mg and Fentanyl 25 mcg was administered intravenously. Moderate Sedation Time: 62 minutes. The patient's level of consciousness and vital signs were monitored continuously by radiology nursing throughout the procedure under my direct supervision. CONTRAST:  80 cc Omnipaque 300 FLUOROSCOPY  TIME:  19 minutes, 12 seconds (629 mGy) COMPLICATIONS: None immediate. PROCEDURE: Informed consent was obtained from the patient following explanation of the procedure, risks, benefits and alternatives. All questions were addressed. A time out was performed prior to the initiation of the procedure. Maximal barrier sterile technique utilized including caps, mask, sterile gowns, sterile gloves, large sterile drape, hand hygiene, and Betadine prep. The right femoral head was marked fluoroscopically. Under sterile conditions and local  anesthesia, the right common femoral artery access was performed with a micropuncture needle. Under direct ultrasound guidance, the right common femoral was accessed with a micropuncture kit. An ultrasound image was saved for documentation purposes. This allowed for placement of a 5-French vascular sheath. A limited arteriogram was performed through the side arm of the sheath confirming appropriate access within the right common femoral artery. Over a Bentson wire, a Mickelson catheter was advanced the caudal aspect of the thoracic aorta where was reformed, back bled and flushed. The Mickelson catheter was then utilized to select the celiac artery and a celiac arteriogram was performed. Next, with the use of a fathom 14 microwire, a regular Renegade microcatheter was advanced to the level of gastroduodenal artery and a selective gastroduodenal arteriogram was performed The microcatheter was advanced beyond the placed endoscopic clips and the gastroduodenal artery was percutaneously coil embolized with multiple 3 mm, 4 mm, 5 mm, 6 mm and 10 mm interlock coils to near the vessel's origin. Multiple sub selective injections were performed during the percutaneous coil embolization Next, the microcatheter was retracted to the level of the common hepatic artery and a selective common hepatic arteriogram was performed. Next, the microcatheter was removed and a completion celiac arteriogram was  performed to the [facility] catheter. The Mickelson catheter was then utilized to select the superior mesenteric artery and a selective superior mesenteric arteriogram was performed. Images were reviewed and the procedure was terminated. All wires, catheters and sheaths were removed from the patient. Hemostasis was achieved at the right groin access site with deployment of an ExoSeal closure device. The patient tolerated the procedure well without immediate post procedural complication. FINDINGS: Selective celiac arteriogram demonstrates a conventional branching pattern with conventional takeoff of the GDA which terminates in a right gastroepiploic artery. Selective gastroduodenal arteriogram confirms this finding and demonstrates several tiny pancreaticoduodenal branches contributing arterial supply to region adjacent to the endoscopy clips. A discrete area of contrast extravasation or vessel irregularity is not identified, however the decision was made to proceed with prophylactic embolization of the GDA which was performed with multiple overlapping coils to near the vessel's origin. Following percutaneous embolization, there is complete occlusion of GDA. Selective superior mesenteric arteriogram is negative for definitive retrograde supply to endoscopy clip. IMPRESSION: Technically successful percutaneous prophylactic coil embolization of the GDA for bleeding duodenal ulcer at the location of the endoscopy clip. PLAN: - The patient is to remain flat for 4 hours with right leg straight. - Continued resuscitative interventions is advised as the patient may continue to experience several additional bloody bowel movements, however ultimately I am hopeful he will stabilize in the coming days. - Repeat endoscopy may be performed at discretion of the GI service as Electronically Signed   By: Sandi Mariscal M.D.   On: 10/19/2020 16:25    EKG:   Independently reviewed.  Orders placed or performed during the hospital  encounter of 10/17/20   ED EKG   ED EKG   EKG 12-Lead   EKG 12-Lead   EKG 12-Lead   EKG    -------------------------------------------------------------------------------------------------------------------------------------------------   SIGNED: Roxan Hockey, MD, [facility],  Pager (Please use [email or web address] to page to text)  If 7PM-7AM, please contact night-coverage [email or web address],  10/20/2020, 6:14 PM

## 2020-10-20 NOTE — Progress Notes (Signed)
Subjective:  Patient states last BM several days ago. No N/V. No abdominal pain. States his left knee and left hand have been hurting since his embolization yesterday. H/O gout. Hand feels more crampy but left knee hurts to try and straighten out. Does not feel hungry. Tolerating full liquid.   Objective: Vital signs in last 24 hours: Temp:  [97.3 F (36.3 C)-98.7 F (37.1 C)] 97.3 F (36.3 C) (07/28 0401) Pulse Rate:  [57-66] 57 (07/28 0401) Resp:  [12-19] 19 (07/28 0401) BP: (102-126)/(47-65) 102/47 (07/28 0401) SpO2:  [98 %-100 %] 100 % (07/28 0401) Weight:  [88.5 kg] 88.5 kg (07/28 0540) Last BM Date: 10/16/20 General:   Alert,  Well-developed, well-nourished, pleasant and cooperative in NAD Head:  Normocephalic and atraumatic. Eyes:  Sclera clear, no icterus.   Abdomen:  Soft, nontender and nondistended.  Normal bowel sounds, without guarding, and without rebound.   Extremities:  Without clubbing, deformity or edema. Specifically left knee and left hand without erythema or swelling. Nontender to touch.  Neurologic:  Alert and  oriented x4;  grossly normal neurologically. Skin:  Intact without significant lesions or rashes. Psych:  Alert and cooperative. Normal mood and affect.  Intake/Output from previous day: 07/27 0701 - 07/28 0700 In: -  Out: 500 [Urine:500] Intake/Output this shift: No intake/output data recorded.  Lab Results: CBC Recent Labs    10/18/20 0431 10/19/20 0427 10/20/20 0629  WBC 10.7* 9.3 8.6  HGB 9.1* 9.2* 8.9*  HCT 26.7* 28.3* 27.2*  MCV 96.0 100.4* 101.9*  PLT 128* 144* 116*   BMET Recent Labs    10/18/20 0431 10/19/20 0427 10/20/20 0629  NA 135 141 135  K 3.9 4.2 3.6  CL 99 106 102  CO2 26 28 26   GLUCOSE 95 146* 254*  BUN 90* 75* 52*  CREATININE 1.39* 1.44* 1.38*  CALCIUM 8.5* 8.6* 7.9*   LFTs Recent Labs    10/19/20 1850  BILITOT 1.7*  BILIDIR 0.5*  IBILI 1.2*  ALKPHOS 124  AST 42*  ALT 22  PROT 6.2*  ALBUMIN 2.8*    No results for input(s): LIPASE in the last 72 hours. PT/INR Recent Labs    10/18/20 0431 10/19/20 1850  LABPROT 15.8* 15.6*  INR 1.3* 1.2      Imaging Studies: IR Angiogram Visceral Selective  Result Date: 10/19/2020 INDICATION: Bleeding duodenal ulcer. Please perform mesenteric arteriogram and percutaneous embolization as indicated. EXAM: 1. ULTRASOUND GUIDANCE FOR ARTERIAL ACCESS 2. SELECTIVE SUPERIOR MESENTERIC ARTERIOGRAM 3. SELECTIVE CELIAC ARTERIOGRAM 4. SUB SELECTIVE COMMON HEPATIC ARTERIOGRAM 5. SUB SELECTIVE GASTRODUODENAL ARTERIOGRAM AND PERCUTANEOUS COIL EMBOLIZATION COMPARISON:  CT the chest, abdomen and pelvis-04/30/2020; 04/20/2020 MEDICATIONS: None ANESTHESIA/SEDATION: Moderate (conscious) sedation was employed during this procedure. A total of Versed 1 mg and Fentanyl 25 mcg was administered intravenously. Moderate Sedation Time: 62 minutes. The patient's level of consciousness and vital signs were monitored continuously by radiology nursing throughout the procedure under my direct supervision. CONTRAST:  80 cc Omnipaque 300 FLUOROSCOPY TIME:  19 minutes, 12 seconds (536 mGy) COMPLICATIONS: None immediate. PROCEDURE: Informed consent was obtained from the patient following explanation of the procedure, risks, benefits and alternatives. All questions were addressed. A time out was performed prior to the initiation of the procedure. Maximal barrier sterile technique utilized including caps, mask, sterile gowns, sterile gloves, large sterile drape, hand hygiene, and Betadine prep. The right femoral head was marked fluoroscopically. Under sterile conditions and local anesthesia, the right common femoral artery access was performed with a micropuncture needle.  Under direct ultrasound guidance, the right common femoral was accessed with a micropuncture kit. An ultrasound image was saved for documentation purposes. This allowed for placement of a 5-French vascular sheath. A limited  arteriogram was performed through the side arm of the sheath confirming appropriate access within the right common femoral artery. Over a Bentson wire, a Mickelson catheter was advanced the caudal aspect of the thoracic aorta where was reformed, back bled and flushed. The Mickelson catheter was then utilized to select the celiac artery and a celiac arteriogram was performed. Next, with the use of a fathom 14 microwire, a regular Renegade microcatheter was advanced to the level of gastroduodenal artery and a selective gastroduodenal arteriogram was performed The microcatheter was advanced beyond the placed endoscopic clips and the gastroduodenal artery was percutaneously coil embolized with multiple 3 mm, 4 mm, 5 mm, 6 mm and 10 mm interlock coils to near the vessel's origin. Multiple sub selective injections were performed during the percutaneous coil embolization Next, the microcatheter was retracted to the level of the common hepatic artery and a selective common hepatic arteriogram was performed. Next, the microcatheter was removed and a completion celiac arteriogram was performed to the [facility] catheter. The Mickelson catheter was then utilized to select the superior mesenteric artery and a selective superior mesenteric arteriogram was performed. Images were reviewed and the procedure was terminated. All wires, catheters and sheaths were removed from the patient. Hemostasis was achieved at the right groin access site with deployment of an ExoSeal closure device. The patient tolerated the procedure well without immediate post procedural complication. FINDINGS: Selective celiac arteriogram demonstrates a conventional branching pattern with conventional takeoff of the GDA which terminates in a right gastroepiploic artery. Selective gastroduodenal arteriogram confirms this finding and demonstrates several tiny pancreaticoduodenal branches contributing arterial supply to region adjacent to the endoscopy clips. A  discrete area of contrast extravasation or vessel irregularity is not identified, however the decision was made to proceed with prophylactic embolization of the GDA which was performed with multiple overlapping coils to near the vessel's origin. Following percutaneous embolization, there is complete occlusion of GDA. Selective superior mesenteric arteriogram is negative for definitive retrograde supply to endoscopy clip. IMPRESSION: Technically successful percutaneous prophylactic coil embolization of the GDA for bleeding duodenal ulcer at the location of the endoscopy clip. PLAN: - The patient is to remain flat for 4 hours with right leg straight. - Continued resuscitative interventions is advised as the patient may continue to experience several additional bloody bowel movements, however ultimately I am hopeful he will stabilize in the coming days. - Repeat endoscopy may be performed at discretion of the GI service as Electronically Signed   By: Sandi Mariscal M.D.   On: 10/19/2020 16:25   IR Angiogram Visceral Selective  Result Date: 10/19/2020 INDICATION: Bleeding duodenal ulcer. Please perform mesenteric arteriogram and percutaneous embolization as indicated. EXAM: 1. ULTRASOUND GUIDANCE FOR ARTERIAL ACCESS 2. SELECTIVE SUPERIOR MESENTERIC ARTERIOGRAM 3. SELECTIVE CELIAC ARTERIOGRAM 4. SUB SELECTIVE COMMON HEPATIC ARTERIOGRAM 5. SUB SELECTIVE GASTRODUODENAL ARTERIOGRAM AND PERCUTANEOUS COIL EMBOLIZATION COMPARISON:  CT the chest, abdomen and pelvis-04/30/2020; 04/20/2020 MEDICATIONS: None ANESTHESIA/SEDATION: Moderate (conscious) sedation was employed during this procedure. A total of Versed 1 mg and Fentanyl 25 mcg was administered intravenously. Moderate Sedation Time: 62 minutes. The patient's level of consciousness and vital signs were monitored continuously by radiology nursing throughout the procedure under my direct supervision. CONTRAST:  80 cc Omnipaque 300 FLUOROSCOPY TIME:  19 minutes, 12 seconds  (409 mGy) COMPLICATIONS: None immediate.  PROCEDURE: Informed consent was obtained from the patient following explanation of the procedure, risks, benefits and alternatives. All questions were addressed. A time out was performed prior to the initiation of the procedure. Maximal barrier sterile technique utilized including caps, mask, sterile gowns, sterile gloves, large sterile drape, hand hygiene, and Betadine prep. The right femoral head was marked fluoroscopically. Under sterile conditions and local anesthesia, the right common femoral artery access was performed with a micropuncture needle. Under direct ultrasound guidance, the right common femoral was accessed with a micropuncture kit. An ultrasound image was saved for documentation purposes. This allowed for placement of a 5-French vascular sheath. A limited arteriogram was performed through the side arm of the sheath confirming appropriate access within the right common femoral artery. Over a Bentson wire, a Mickelson catheter was advanced the caudal aspect of the thoracic aorta where was reformed, back bled and flushed. The Mickelson catheter was then utilized to select the celiac artery and a celiac arteriogram was performed. Next, with the use of a fathom 14 microwire, a regular Renegade microcatheter was advanced to the level of gastroduodenal artery and a selective gastroduodenal arteriogram was performed The microcatheter was advanced beyond the placed endoscopic clips and the gastroduodenal artery was percutaneously coil embolized with multiple 3 mm, 4 mm, 5 mm, 6 mm and 10 mm interlock coils to near the vessel's origin. Multiple sub selective injections were performed during the percutaneous coil embolization Next, the microcatheter was retracted to the level of the common hepatic artery and a selective common hepatic arteriogram was performed. Next, the microcatheter was removed and a completion celiac arteriogram was performed to the [facility]  catheter. The Mickelson catheter was then utilized to select the superior mesenteric artery and a selective superior mesenteric arteriogram was performed. Images were reviewed and the procedure was terminated. All wires, catheters and sheaths were removed from the patient. Hemostasis was achieved at the right groin access site with deployment of an ExoSeal closure device. The patient tolerated the procedure well without immediate post procedural complication. FINDINGS: Selective celiac arteriogram demonstrates a conventional branching pattern with conventional takeoff of the GDA which terminates in a right gastroepiploic artery. Selective gastroduodenal arteriogram confirms this finding and demonstrates several tiny pancreaticoduodenal branches contributing arterial supply to region adjacent to the endoscopy clips. A discrete area of contrast extravasation or vessel irregularity is not identified, however the decision was made to proceed with prophylactic embolization of the GDA which was performed with multiple overlapping coils to near the vessel's origin. Following percutaneous embolization, there is complete occlusion of GDA. Selective superior mesenteric arteriogram is negative for definitive retrograde supply to endoscopy clip. IMPRESSION: Technically successful percutaneous prophylactic coil embolization of the GDA for bleeding duodenal ulcer at the location of the endoscopy clip. PLAN: - The patient is to remain flat for 4 hours with right leg straight. - Continued resuscitative interventions is advised as the patient may continue to experience several additional bloody bowel movements, however ultimately I am hopeful he will stabilize in the coming days. - Repeat endoscopy may be performed at discretion of the GI service as Electronically Signed   By: Sandi Mariscal M.D.   On: 10/19/2020 16:25   IR Angiogram Selective Each Additional Vessel  Result Date: 10/19/2020 INDICATION: Bleeding duodenal ulcer.  Please perform mesenteric arteriogram and percutaneous embolization as indicated. EXAM: 1. ULTRASOUND GUIDANCE FOR ARTERIAL ACCESS 2. SELECTIVE SUPERIOR MESENTERIC ARTERIOGRAM 3. SELECTIVE CELIAC ARTERIOGRAM 4. SUB SELECTIVE COMMON HEPATIC ARTERIOGRAM 5. SUB SELECTIVE GASTRODUODENAL ARTERIOGRAM AND PERCUTANEOUS  COIL EMBOLIZATION COMPARISON:  CT the chest, abdomen and pelvis-04/30/2020; 04/20/2020 MEDICATIONS: None ANESTHESIA/SEDATION: Moderate (conscious) sedation was employed during this procedure. A total of Versed 1 mg and Fentanyl 25 mcg was administered intravenously. Moderate Sedation Time: 62 minutes. The patient's level of consciousness and vital signs were monitored continuously by radiology nursing throughout the procedure under my direct supervision. CONTRAST:  80 cc Omnipaque 300 FLUOROSCOPY TIME:  19 minutes, 12 seconds (629 mGy) COMPLICATIONS: None immediate. PROCEDURE: Informed consent was obtained from the patient following explanation of the procedure, risks, benefits and alternatives. All questions were addressed. A time out was performed prior to the initiation of the procedure. Maximal barrier sterile technique utilized including caps, mask, sterile gowns, sterile gloves, large sterile drape, hand hygiene, and Betadine prep. The right femoral head was marked fluoroscopically. Under sterile conditions and local anesthesia, the right common femoral artery access was performed with a micropuncture needle. Under direct ultrasound guidance, the right common femoral was accessed with a micropuncture kit. An ultrasound image was saved for documentation purposes. This allowed for placement of a 5-French vascular sheath. A limited arteriogram was performed through the side arm of the sheath confirming appropriate access within the right common femoral artery. Over a Bentson wire, a Mickelson catheter was advanced the caudal aspect of the thoracic aorta where was reformed, back bled and flushed. The  Mickelson catheter was then utilized to select the celiac artery and a celiac arteriogram was performed. Next, with the use of a fathom 14 microwire, a regular Renegade microcatheter was advanced to the level of gastroduodenal artery and a selective gastroduodenal arteriogram was performed The microcatheter was advanced beyond the placed endoscopic clips and the gastroduodenal artery was percutaneously coil embolized with multiple 3 mm, 4 mm, 5 mm, 6 mm and 10 mm interlock coils to near the vessel's origin. Multiple sub selective injections were performed during the percutaneous coil embolization Next, the microcatheter was retracted to the level of the common hepatic artery and a selective common hepatic arteriogram was performed. Next, the microcatheter was removed and a completion celiac arteriogram was performed to the [facility] catheter. The Mickelson catheter was then utilized to select the superior mesenteric artery and a selective superior mesenteric arteriogram was performed. Images were reviewed and the procedure was terminated. All wires, catheters and sheaths were removed from the patient. Hemostasis was achieved at the right groin access site with deployment of an ExoSeal closure device. The patient tolerated the procedure well without immediate post procedural complication. FINDINGS: Selective celiac arteriogram demonstrates a conventional branching pattern with conventional takeoff of the GDA which terminates in a right gastroepiploic artery. Selective gastroduodenal arteriogram confirms this finding and demonstrates several tiny pancreaticoduodenal branches contributing arterial supply to region adjacent to the endoscopy clips. A discrete area of contrast extravasation or vessel irregularity is not identified, however the decision was made to proceed with prophylactic embolization of the GDA which was performed with multiple overlapping coils to near the vessel's origin. Following percutaneous  embolization, there is complete occlusion of GDA. Selective superior mesenteric arteriogram is negative for definitive retrograde supply to endoscopy clip. IMPRESSION: Technically successful percutaneous prophylactic coil embolization of the GDA for bleeding duodenal ulcer at the location of the endoscopy clip. PLAN: - The patient is to remain flat for 4 hours with right leg straight. - Continued resuscitative interventions is advised as the patient may continue to experience several additional bloody bowel movements, however ultimately I am hopeful he will stabilize in the coming days. - Repeat  endoscopy may be performed at discretion of the GI service as Electronically Signed   By: Sandi Mariscal M.D.   On: 10/19/2020 16:25   IR Angiogram Selective Each Additional Vessel  Result Date: 10/19/2020 INDICATION: Bleeding duodenal ulcer. Please perform mesenteric arteriogram and percutaneous embolization as indicated. EXAM: 1. ULTRASOUND GUIDANCE FOR ARTERIAL ACCESS 2. SELECTIVE SUPERIOR MESENTERIC ARTERIOGRAM 3. SELECTIVE CELIAC ARTERIOGRAM 4. SUB SELECTIVE COMMON HEPATIC ARTERIOGRAM 5. SUB SELECTIVE GASTRODUODENAL ARTERIOGRAM AND PERCUTANEOUS COIL EMBOLIZATION COMPARISON:  CT the chest, abdomen and pelvis-04/30/2020; 04/20/2020 MEDICATIONS: None ANESTHESIA/SEDATION: Moderate (conscious) sedation was employed during this procedure. A total of Versed 1 mg and Fentanyl 25 mcg was administered intravenously. Moderate Sedation Time: 62 minutes. The patient's level of consciousness and vital signs were monitored continuously by radiology nursing throughout the procedure under my direct supervision. CONTRAST:  80 cc Omnipaque 300 FLUOROSCOPY TIME:  19 minutes, 12 seconds (960 mGy) COMPLICATIONS: None immediate. PROCEDURE: Informed consent was obtained from the patient following explanation of the procedure, risks, benefits and alternatives. All questions were addressed. A time out was performed prior to the initiation of  the procedure. Maximal barrier sterile technique utilized including caps, mask, sterile gowns, sterile gloves, large sterile drape, hand hygiene, and Betadine prep. The right femoral head was marked fluoroscopically. Under sterile conditions and local anesthesia, the right common femoral artery access was performed with a micropuncture needle. Under direct ultrasound guidance, the right common femoral was accessed with a micropuncture kit. An ultrasound image was saved for documentation purposes. This allowed for placement of a 5-French vascular sheath. A limited arteriogram was performed through the side arm of the sheath confirming appropriate access within the right common femoral artery. Over a Bentson wire, a Mickelson catheter was advanced the caudal aspect of the thoracic aorta where was reformed, back bled and flushed. The Mickelson catheter was then utilized to select the celiac artery and a celiac arteriogram was performed. Next, with the use of a fathom 14 microwire, a regular Renegade microcatheter was advanced to the level of gastroduodenal artery and a selective gastroduodenal arteriogram was performed The microcatheter was advanced beyond the placed endoscopic clips and the gastroduodenal artery was percutaneously coil embolized with multiple 3 mm, 4 mm, 5 mm, 6 mm and 10 mm interlock coils to near the vessel's origin. Multiple sub selective injections were performed during the percutaneous coil embolization Next, the microcatheter was retracted to the level of the common hepatic artery and a selective common hepatic arteriogram was performed. Next, the microcatheter was removed and a completion celiac arteriogram was performed to the [facility] catheter. The Mickelson catheter was then utilized to select the superior mesenteric artery and a selective superior mesenteric arteriogram was performed. Images were reviewed and the procedure was terminated. All wires, catheters and sheaths were removed from  the patient. Hemostasis was achieved at the right groin access site with deployment of an ExoSeal closure device. The patient tolerated the procedure well without immediate post procedural complication. FINDINGS: Selective celiac arteriogram demonstrates a conventional branching pattern with conventional takeoff of the GDA which terminates in a right gastroepiploic artery. Selective gastroduodenal arteriogram confirms this finding and demonstrates several tiny pancreaticoduodenal branches contributing arterial supply to region adjacent to the endoscopy clips. A discrete area of contrast extravasation or vessel irregularity is not identified, however the decision was made to proceed with prophylactic embolization of the GDA which was performed with multiple overlapping coils to near the vessel's origin. Following percutaneous embolization, there is complete occlusion of GDA. Selective superior  mesenteric arteriogram is negative for definitive retrograde supply to endoscopy clip. IMPRESSION: Technically successful percutaneous prophylactic coil embolization of the GDA for bleeding duodenal ulcer at the location of the endoscopy clip. PLAN: - The patient is to remain flat for 4 hours with right leg straight. - Continued resuscitative interventions is advised as the patient may continue to experience several additional bloody bowel movements, however ultimately I am hopeful he will stabilize in the coming days. - Repeat endoscopy may be performed at discretion of the GI service as Electronically Signed   By: Sandi Mariscal M.D.   On: 10/19/2020 16:25   IR US Guide Vasc Access Right  Result Date: 10/19/2020 INDICATION: Bleeding duodenal ulcer. Please perform mesenteric arteriogram and percutaneous embolization as indicated. EXAM: 1. ULTRASOUND GUIDANCE FOR ARTERIAL ACCESS 2. SELECTIVE SUPERIOR MESENTERIC ARTERIOGRAM 3. SELECTIVE CELIAC ARTERIOGRAM 4. SUB SELECTIVE COMMON HEPATIC ARTERIOGRAM 5. SUB SELECTIVE  GASTRODUODENAL ARTERIOGRAM AND PERCUTANEOUS COIL EMBOLIZATION COMPARISON:  CT the chest, abdomen and pelvis-04/30/2020; 04/20/2020 MEDICATIONS: None ANESTHESIA/SEDATION: Moderate (conscious) sedation was employed during this procedure. A total of Versed 1 mg and Fentanyl 25 mcg was administered intravenously. Moderate Sedation Time: 62 minutes. The patient's level of consciousness and vital signs were monitored continuously by radiology nursing throughout the procedure under my direct supervision. CONTRAST:  80 cc Omnipaque 300 FLUOROSCOPY TIME:  19 minutes, 12 seconds (272 mGy) COMPLICATIONS: None immediate. PROCEDURE: Informed consent was obtained from the patient following explanation of the procedure, risks, benefits and alternatives. All questions were addressed. A time out was performed prior to the initiation of the procedure. Maximal barrier sterile technique utilized including caps, mask, sterile gowns, sterile gloves, large sterile drape, hand hygiene, and Betadine prep. The right femoral head was marked fluoroscopically. Under sterile conditions and local anesthesia, the right common femoral artery access was performed with a micropuncture needle. Under direct ultrasound guidance, the right common femoral was accessed with a micropuncture kit. An ultrasound image was saved for documentation purposes. This allowed for placement of a 5-French vascular sheath. A limited arteriogram was performed through the side arm of the sheath confirming appropriate access within the right common femoral artery. Over a Bentson wire, a Mickelson catheter was advanced the caudal aspect of the thoracic aorta where was reformed, back bled and flushed. The Mickelson catheter was then utilized to select the celiac artery and a celiac arteriogram was performed. Next, with the use of a fathom 14 microwire, a regular Renegade microcatheter was advanced to the level of gastroduodenal artery and a selective gastroduodenal  arteriogram was performed The microcatheter was advanced beyond the placed endoscopic clips and the gastroduodenal artery was percutaneously coil embolized with multiple 3 mm, 4 mm, 5 mm, 6 mm and 10 mm interlock coils to near the vessel's origin. Multiple sub selective injections were performed during the percutaneous coil embolization Next, the microcatheter was retracted to the level of the common hepatic artery and a selective common hepatic arteriogram was performed. Next, the microcatheter was removed and a completion celiac arteriogram was performed to the [facility] catheter. The Mickelson catheter was then utilized to select the superior mesenteric artery and a selective superior mesenteric arteriogram was performed. Images were reviewed and the procedure was terminated. All wires, catheters and sheaths were removed from the patient. Hemostasis was achieved at the right groin access site with deployment of an ExoSeal closure device. The patient tolerated the procedure well without immediate post procedural complication. FINDINGS: Selective celiac arteriogram demonstrates a conventional branching pattern with conventional takeoff of  the GDA which terminates in a right gastroepiploic artery. Selective gastroduodenal arteriogram confirms this finding and demonstrates several tiny pancreaticoduodenal branches contributing arterial supply to region adjacent to the endoscopy clips. A discrete area of contrast extravasation or vessel irregularity is not identified, however the decision was made to proceed with prophylactic embolization of the GDA which was performed with multiple overlapping coils to near the vessel's origin. Following percutaneous embolization, there is complete occlusion of GDA. Selective superior mesenteric arteriogram is negative for definitive retrograde supply to endoscopy clip. IMPRESSION: Technically successful percutaneous prophylactic coil embolization of the GDA for bleeding duodenal  ulcer at the location of the endoscopy clip. PLAN: - The patient is to remain flat for 4 hours with right leg straight. - Continued resuscitative interventions is advised as the patient may continue to experience several additional bloody bowel movements, however ultimately I am hopeful he will stabilize in the coming days. - Repeat endoscopy may be performed at discretion of the GI service as Electronically Signed   By: Sandi Mariscal M.D.   On: 10/19/2020 16:25   DG Chest Port 1 View  Result Date: 10/17/2020 CLINICAL DATA:  Weakness, fall EXAM: PORTABLE CHEST 1 VIEW COMPARISON:  06/08/2020 FINDINGS: Low lung volumes. No consolidation or edema. No pleural effusion or pneumothorax. Stable cardiomediastinal contours. Post TAVR. Left chest wall dual lead pacemaker. IMPRESSION: No acute process in the chest. Electronically Signed   By: Macy Mis M.D.   On: 10/17/2020 08:38   CUP PACEART REMOTE DEVICE CHECK  Result Date: 10/13/2020 Scheduled remote reviewed. Normal device function.  Permanent AF. +OAC per prior report. VRC per histogram.  Next remote 91 days.  IR EMBO ART  VEN HEMORR LYMPH EXTRAV  INC GUIDE ROADMAPPING  Result Date: 10/19/2020 INDICATION: Bleeding duodenal ulcer. Please perform mesenteric arteriogram and percutaneous embolization as indicated. EXAM: 1. ULTRASOUND GUIDANCE FOR ARTERIAL ACCESS 2. SELECTIVE SUPERIOR MESENTERIC ARTERIOGRAM 3. SELECTIVE CELIAC ARTERIOGRAM 4. SUB SELECTIVE COMMON HEPATIC ARTERIOGRAM 5. SUB SELECTIVE GASTRODUODENAL ARTERIOGRAM AND PERCUTANEOUS COIL EMBOLIZATION COMPARISON:  CT the chest, abdomen and pelvis-04/30/2020; 04/20/2020 MEDICATIONS: None ANESTHESIA/SEDATION: Moderate (conscious) sedation was employed during this procedure. A total of Versed 1 mg and Fentanyl 25 mcg was administered intravenously. Moderate Sedation Time: 62 minutes. The patient's level of consciousness and vital signs were monitored continuously by radiology nursing throughout the  procedure under my direct supervision. CONTRAST:  80 cc Omnipaque 300 FLUOROSCOPY TIME:  19 minutes, 12 seconds (419 mGy) COMPLICATIONS: None immediate. PROCEDURE: Informed consent was obtained from the patient following explanation of the procedure, risks, benefits and alternatives. All questions were addressed. A time out was performed prior to the initiation of the procedure. Maximal barrier sterile technique utilized including caps, mask, sterile gowns, sterile gloves, large sterile drape, hand hygiene, and Betadine prep. The right femoral head was marked fluoroscopically. Under sterile conditions and local anesthesia, the right common femoral artery access was performed with a micropuncture needle. Under direct ultrasound guidance, the right common femoral was accessed with a micropuncture kit. An ultrasound image was saved for documentation purposes. This allowed for placement of a 5-French vascular sheath. A limited arteriogram was performed through the side arm of the sheath confirming appropriate access within the right common femoral artery. Over a Bentson wire, a Mickelson catheter was advanced the caudal aspect of the thoracic aorta where was reformed, back bled and flushed. The Mickelson catheter was then utilized to select the celiac artery and a celiac arteriogram was performed. Next, with the use of a  fathom 14 microwire, a regular Renegade microcatheter was advanced to the level of gastroduodenal artery and a selective gastroduodenal arteriogram was performed The microcatheter was advanced beyond the placed endoscopic clips and the gastroduodenal artery was percutaneously coil embolized with multiple 3 mm, 4 mm, 5 mm, 6 mm and 10 mm interlock coils to near the vessel's origin. Multiple sub selective injections were performed during the percutaneous coil embolization Next, the microcatheter was retracted to the level of the common hepatic artery and a selective common hepatic arteriogram was  performed. Next, the microcatheter was removed and a completion celiac arteriogram was performed to the [facility] catheter. The Mickelson catheter was then utilized to select the superior mesenteric artery and a selective superior mesenteric arteriogram was performed. Images were reviewed and the procedure was terminated. All wires, catheters and sheaths were removed from the patient. Hemostasis was achieved at the right groin access site with deployment of an ExoSeal closure device. The patient tolerated the procedure well without immediate post procedural complication. FINDINGS: Selective celiac arteriogram demonstrates a conventional branching pattern with conventional takeoff of the GDA which terminates in a right gastroepiploic artery. Selective gastroduodenal arteriogram confirms this finding and demonstrates several tiny pancreaticoduodenal branches contributing arterial supply to region adjacent to the endoscopy clips. A discrete area of contrast extravasation or vessel irregularity is not identified, however the decision was made to proceed with prophylactic embolization of the GDA which was performed with multiple overlapping coils to near the vessel's origin. Following percutaneous embolization, there is complete occlusion of GDA. Selective superior mesenteric arteriogram is negative for definitive retrograde supply to endoscopy clip. IMPRESSION: Technically successful percutaneous prophylactic coil embolization of the GDA for bleeding duodenal ulcer at the location of the endoscopy clip. PLAN: - The patient is to remain flat for 4 hours with right leg straight. - Continued resuscitative interventions is advised as the patient may continue to experience several additional bloody bowel movements, however ultimately I am hopeful he will stabilize in the coming days. - Repeat endoscopy may be performed at discretion of the GI service as Electronically Signed   By: Sandi Mariscal M.D.   On: 10/19/2020 16:25  [2  weeks]   Assessment:  75 year old male with history of A. fib, chronically on Eliquis, CHF with reduced EF, aortic stenosis status post TAVR, [place] cirrhosis presenting to the ED July 25 with generalized weakness/fatigue, found to have acute blood loss anemia in the setting of melena, hemoglobin 7.3 initially.    GI bleeding: Received 2 units of packed red blood cells with hemoglobin up over 9.  EGD July 26 found to have 1 nonbleeding cratered duodenal ulcer with a nonbleeding visible vessel in duodenal bulb likely GDA.  Lesion approximately 20 mm at largest point. Area was successfully injected with 81m of 1:10,000 solution of epinephrine for hemostasis. Two hemostatic clips were attempted to be placed, however, due to fibrotic nature of edges of ulcer, grasping was not achieved. No bleeding at the end of the procedure.  Patient underwent embolization of GDA with IR yesterday.  Hemoglobin stable at 8.9.  NASH cirrhosis: Recent diagnosis. Alk phos 146 on admission, now normal (307 in 08/2020), total bilirubin 1.4-->1.7 (mostly indirect), AST 42, ALT 22. INR 1.3. previous viral markers negative 10/2019. Appears to be well compensated.  Left knee/left hand pain: patient complains of pain "worse" since procedure yesterday. Hand feels crampy. Left knee hurts to move. ?gout related or positional from yesterdays procedure. Will notify attending.   Plan: F/U H.pylori serologies. Continue  full liquids.  Complete 7-day course antibiotics for SBP prophylaxis. D/C octreotide. Complete 72 hours of pantoprazole infusion, ends tomorrow evening.  F/U AMA labs.  Egd in 8-12 weeks to document complete healing of duodenal ulcer.  He will need close follow up with primary GI, Dr. Jenetta Downer.  Management of hand/knee pain per attending.   Laureen Ochs. Bernarda Caffey [facility] [phone] 7/28/202210:59 AM    LOS: 3 days

## 2020-10-20 NOTE — Progress Notes (Addendum)
Referring Physician(s): Dr Eula Listen  Supervising Physician: Sandi Mariscal  Patient Status:  AP IP  Chief Complaint:  GI Bleed GDA coil embolization in IR 7/27  Subjective:  Pt is up in chair Has eaten breakfast Doing well No pain from procedure Denies rectal bleeding H/H stable Cr 1.38  Allergies: Patient has no known allergies.  Medications: Prior to Admission medications   Medication Sig Start Date End Date Taking? Authorizing Provider  acetaminophen (TYLENOL) 325 MG tablet Take 2 tablets (650 mg total) by mouth every 4 (four) hours as needed for headache or mild pain. 05/15/20  Yes Isaiah Serge, NP  allopurinol (ZYLOPRIM) 300 MG tablet Take 0.5 tablets (150 mg total) by mouth daily. 07/20/20  Yes Bensimhon, Shaune Pascal, MD  apixaban (ELIQUIS) 5 MG TABS tablet Take 1 tablet (5 mg total) by mouth 2 (two) times daily. 05/24/20  Yes Verta Ellen., NP  aspirin 81 MG chewable tablet Chew 1 tablet (81 mg total) by mouth daily. 05/16/20  Yes Isaiah Serge, NP  benzonatate (TESSALON) 100 MG capsule Take 1 capsule (100 mg total) by mouth 3 (three) times daily as needed for cough. 09/27/20  Yes Evans Lance, MD  carvedilol (COREG) 3.125 MG tablet TAKE 1 TABLET IN THE MORNING AND 2 TABLETS IN THE EVENING Patient taking differently: Take 3.125-6.25 mg by mouth 2 (two) times daily with a meal. 1 tablet in the morning and 2 tablets in the evening 07/20/20  Yes Bensimhon, Shaune Pascal, MD  colchicine 0.6 MG tablet Take 1 tablet (0.6 mg total) by mouth daily as needed. Patient taking differently: Take 0.6 mg by mouth daily as needed (gout). 05/15/20  Yes Isaiah Serge, NP  Dulaglutide (TRULICITY) 1.5 ZL/9.3TT SOPN INJECT 1.5MG (1 PEN) SUBCUTANEOUSLY EVERY WEEK Patient taking differently: Inject 1.5 mg into the skin once a week. 10/05/20  Yes Nida, Marella Chimes, MD  insulin degludec (TRESIBA FLEXTOUCH) 100 UNIT/ML FlexTouch Pen Inject 20 Units into the skin daily. 08/25/20  Yes Nida,  Marella Chimes, MD  KLOR-CON M20 20 MEQ tablet TAKE 1.5 TABLETS (30 MEQ TOTAL) BY MOUTH 2 (TWO) TIMES DAILY. Patient taking differently: Take 30 mEq by mouth 2 (two) times daily. 08/08/20  Yes Evans Lance, MD  loratadine (CLARITIN) 10 MG tablet TAKE 1 TABLET BY MOUTH EVERY DAY Patient taking differently: Take 10 mg by mouth daily. 10/03/20  Yes Bensimhon, Shaune Pascal, MD  metolazone (ZAROXOLYN) 2.5 MG tablet Take 1 tablet (2.5 mg total) by mouth as directed. For wight of 200 lb and greater 09/20/20  Yes Bensimhon, Shaune Pascal, MD  Multiple Vitamins-Minerals (ONE-A-DAY MENS 50+) TABS Take 1 tablet by mouth daily with breakfast.   Yes [provider]  spironolactone (ALDACTONE) 25 MG tablet Take 0.5 tablets (12.5 mg total) by mouth daily. 07/20/20  Yes Bensimhon, Shaune Pascal, MD  torsemide (DEMADEX) 20 MG tablet Take 3 tablets (60 mg total) by mouth 2 (two) times daily. 07/28/20  Yes Bensimhon, Shaune Pascal, MD  ACCU-CHEK AVIVA PLUS test strip TEST BLOOD SUGAR TWICE DAILY BEFORE BREAKFAST AND AT BEDTIME 07/19/20   Cassandria Anger, MD  Insulin Pen Needle (BD PEN NEEDLE NANO U/F) 32G X 4 MM MISC 1 each by Does not apply route 4 (four) times daily. 12/23/18   Cassandria Anger, MD     Vital Signs: BP (!) 102/47 (BP Location: Right Arm)   Pulse (!) 57   Temp (!) 97.3 F (36.3 C) (Oral)   Resp  19   Ht 6' (1.829 m)   Wt 195 lb 1.7 oz (88.5 kg)   SpO2 100%   BMI 26.46 kg/m   Physical Exam Skin:    General: Skin is warm.     Comments: Rt groin site is clean and dry NT no bleeding No hematoma  Rt foot 1+ pulses--- warm to touch    Imaging: IR Angiogram Visceral Selective  Result Date: 10/19/2020 INDICATION: Bleeding duodenal ulcer. Please perform mesenteric arteriogram and percutaneous embolization as indicated. EXAM: 1. ULTRASOUND GUIDANCE FOR ARTERIAL ACCESS 2. SELECTIVE SUPERIOR MESENTERIC ARTERIOGRAM 3. SELECTIVE CELIAC ARTERIOGRAM 4. SUB SELECTIVE COMMON HEPATIC ARTERIOGRAM 5.  SUB SELECTIVE GASTRODUODENAL ARTERIOGRAM AND PERCUTANEOUS COIL EMBOLIZATION COMPARISON:  CT the chest, abdomen and pelvis-04/30/2020; 04/20/2020 MEDICATIONS: None ANESTHESIA/SEDATION: Moderate (conscious) sedation was employed during this procedure. A total of Versed 1 mg and Fentanyl 25 mcg was administered intravenously. Moderate Sedation Time: 62 minutes. The patient's level of consciousness and vital signs were monitored continuously by radiology nursing throughout the procedure under my direct supervision. CONTRAST:  80 cc Omnipaque 300 FLUOROSCOPY TIME:  19 minutes, 12 seconds (810 mGy) COMPLICATIONS: None immediate. PROCEDURE: Informed consent was obtained from the patient following explanation of the procedure, risks, benefits and alternatives. All questions were addressed. A time out was performed prior to the initiation of the procedure. Maximal barrier sterile technique utilized including caps, mask, sterile gowns, sterile gloves, large sterile drape, hand hygiene, and Betadine prep. The right femoral head was marked fluoroscopically. Under sterile conditions and local anesthesia, the right common femoral artery access was performed with a micropuncture needle. Under direct ultrasound guidance, the right common femoral was accessed with a micropuncture kit. An ultrasound image was saved for documentation purposes. This allowed for placement of a 5-French vascular sheath. A limited arteriogram was performed through the side arm of the sheath confirming appropriate access within the right common femoral artery. Over a Bentson wire, a Mickelson catheter was advanced the caudal aspect of the thoracic aorta where was reformed, back bled and flushed. The Mickelson catheter was then utilized to select the celiac artery and a celiac arteriogram was performed. Next, with the use of a fathom 14 microwire, a regular Renegade microcatheter was advanced to the level of gastroduodenal artery and a selective  gastroduodenal arteriogram was performed The microcatheter was advanced beyond the placed endoscopic clips and the gastroduodenal artery was percutaneously coil embolized with multiple 3 mm, 4 mm, 5 mm, 6 mm and 10 mm interlock coils to near the vessel's origin. Multiple sub selective injections were performed during the percutaneous coil embolization Next, the microcatheter was retracted to the level of the common hepatic artery and a selective common hepatic arteriogram was performed. Next, the microcatheter was removed and a completion celiac arteriogram was performed to the [facility] catheter. The Mickelson catheter was then utilized to select the superior mesenteric artery and a selective superior mesenteric arteriogram was performed. Images were reviewed and the procedure was terminated. All wires, catheters and sheaths were removed from the patient. Hemostasis was achieved at the right groin access site with deployment of an ExoSeal closure device. The patient tolerated the procedure well without immediate post procedural complication. FINDINGS: Selective celiac arteriogram demonstrates a conventional branching pattern with conventional takeoff of the GDA which terminates in a right gastroepiploic artery. Selective gastroduodenal arteriogram confirms this finding and demonstrates several tiny pancreaticoduodenal branches contributing arterial supply to region adjacent to the endoscopy clips. A discrete area of contrast extravasation or vessel irregularity is not  identified, however the decision was made to proceed with prophylactic embolization of the GDA which was performed with multiple overlapping coils to near the vessel's origin. Following percutaneous embolization, there is complete occlusion of GDA. Selective superior mesenteric arteriogram is negative for definitive retrograde supply to endoscopy clip. IMPRESSION: Technically successful percutaneous prophylactic coil embolization of the GDA for  bleeding duodenal ulcer at the location of the endoscopy clip. PLAN: - The patient is to remain flat for 4 hours with right leg straight. - Continued resuscitative interventions is advised as the patient may continue to experience several additional bloody bowel movements, however ultimately I am hopeful he will stabilize in the coming days. - Repeat endoscopy may be performed at discretion of the GI service as Electronically Signed   By: Sandi Mariscal M.D.   On: 10/19/2020 16:25   IR Angiogram Visceral Selective  Result Date: 10/19/2020 INDICATION: Bleeding duodenal ulcer. Please perform mesenteric arteriogram and percutaneous embolization as indicated. EXAM: 1. ULTRASOUND GUIDANCE FOR ARTERIAL ACCESS 2. SELECTIVE SUPERIOR MESENTERIC ARTERIOGRAM 3. SELECTIVE CELIAC ARTERIOGRAM 4. SUB SELECTIVE COMMON HEPATIC ARTERIOGRAM 5. SUB SELECTIVE GASTRODUODENAL ARTERIOGRAM AND PERCUTANEOUS COIL EMBOLIZATION COMPARISON:  CT the chest, abdomen and pelvis-04/30/2020; 04/20/2020 MEDICATIONS: None ANESTHESIA/SEDATION: Moderate (conscious) sedation was employed during this procedure. A total of Versed 1 mg and Fentanyl 25 mcg was administered intravenously. Moderate Sedation Time: 62 minutes. The patient's level of consciousness and vital signs were monitored continuously by radiology nursing throughout the procedure under my direct supervision. CONTRAST:  80 cc Omnipaque 300 FLUOROSCOPY TIME:  19 minutes, 12 seconds (086 mGy) COMPLICATIONS: None immediate. PROCEDURE: Informed consent was obtained from the patient following explanation of the procedure, risks, benefits and alternatives. All questions were addressed. A time out was performed prior to the initiation of the procedure. Maximal barrier sterile technique utilized including caps, mask, sterile gowns, sterile gloves, large sterile drape, hand hygiene, and Betadine prep. The right femoral head was marked fluoroscopically. Under sterile conditions and local anesthesia,  the right common femoral artery access was performed with a micropuncture needle. Under direct ultrasound guidance, the right common femoral was accessed with a micropuncture kit. An ultrasound image was saved for documentation purposes. This allowed for placement of a 5-French vascular sheath. A limited arteriogram was performed through the side arm of the sheath confirming appropriate access within the right common femoral artery. Over a Bentson wire, a Mickelson catheter was advanced the caudal aspect of the thoracic aorta where was reformed, back bled and flushed. The Mickelson catheter was then utilized to select the celiac artery and a celiac arteriogram was performed. Next, with the use of a fathom 14 microwire, a regular Renegade microcatheter was advanced to the level of gastroduodenal artery and a selective gastroduodenal arteriogram was performed The microcatheter was advanced beyond the placed endoscopic clips and the gastroduodenal artery was percutaneously coil embolized with multiple 3 mm, 4 mm, 5 mm, 6 mm and 10 mm interlock coils to near the vessel's origin. Multiple sub selective injections were performed during the percutaneous coil embolization Next, the microcatheter was retracted to the level of the common hepatic artery and a selective common hepatic arteriogram was performed. Next, the microcatheter was removed and a completion celiac arteriogram was performed to the [facility] catheter. The Mickelson catheter was then utilized to select the superior mesenteric artery and a selective superior mesenteric arteriogram was performed. Images were reviewed and the procedure was terminated. All wires, catheters and sheaths were removed from the patient. Hemostasis was achieved at the  right groin access site with deployment of an ExoSeal closure device. The patient tolerated the procedure well without immediate post procedural complication. FINDINGS: Selective celiac arteriogram demonstrates a  conventional branching pattern with conventional takeoff of the GDA which terminates in a right gastroepiploic artery. Selective gastroduodenal arteriogram confirms this finding and demonstrates several tiny pancreaticoduodenal branches contributing arterial supply to region adjacent to the endoscopy clips. A discrete area of contrast extravasation or vessel irregularity is not identified, however the decision was made to proceed with prophylactic embolization of the GDA which was performed with multiple overlapping coils to near the vessel's origin. Following percutaneous embolization, there is complete occlusion of GDA. Selective superior mesenteric arteriogram is negative for definitive retrograde supply to endoscopy clip. IMPRESSION: Technically successful percutaneous prophylactic coil embolization of the GDA for bleeding duodenal ulcer at the location of the endoscopy clip. PLAN: - The patient is to remain flat for 4 hours with right leg straight. - Continued resuscitative interventions is advised as the patient may continue to experience several additional bloody bowel movements, however ultimately I am hopeful he will stabilize in the coming days. - Repeat endoscopy may be performed at discretion of the GI service as Electronically Signed   By: Sandi Mariscal M.D.   On: 10/19/2020 16:25   IR Angiogram Selective Each Additional Vessel  Result Date: 10/19/2020 INDICATION: Bleeding duodenal ulcer. Please perform mesenteric arteriogram and percutaneous embolization as indicated. EXAM: 1. ULTRASOUND GUIDANCE FOR ARTERIAL ACCESS 2. SELECTIVE SUPERIOR MESENTERIC ARTERIOGRAM 3. SELECTIVE CELIAC ARTERIOGRAM 4. SUB SELECTIVE COMMON HEPATIC ARTERIOGRAM 5. SUB SELECTIVE GASTRODUODENAL ARTERIOGRAM AND PERCUTANEOUS COIL EMBOLIZATION COMPARISON:  CT the chest, abdomen and pelvis-04/30/2020; 04/20/2020 MEDICATIONS: None ANESTHESIA/SEDATION: Moderate (conscious) sedation was employed during this procedure. A total of Versed  1 mg and Fentanyl 25 mcg was administered intravenously. Moderate Sedation Time: 62 minutes. The patient's level of consciousness and vital signs were monitored continuously by radiology nursing throughout the procedure under my direct supervision. CONTRAST:  80 cc Omnipaque 300 FLUOROSCOPY TIME:  19 minutes, 12 seconds (269 mGy) COMPLICATIONS: None immediate. PROCEDURE: Informed consent was obtained from the patient following explanation of the procedure, risks, benefits and alternatives. All questions were addressed. A time out was performed prior to the initiation of the procedure. Maximal barrier sterile technique utilized including caps, mask, sterile gowns, sterile gloves, large sterile drape, hand hygiene, and Betadine prep. The right femoral head was marked fluoroscopically. Under sterile conditions and local anesthesia, the right common femoral artery access was performed with a micropuncture needle. Under direct ultrasound guidance, the right common femoral was accessed with a micropuncture kit. An ultrasound image was saved for documentation purposes. This allowed for placement of a 5-French vascular sheath. A limited arteriogram was performed through the side arm of the sheath confirming appropriate access within the right common femoral artery. Over a Bentson wire, a Mickelson catheter was advanced the caudal aspect of the thoracic aorta where was reformed, back bled and flushed. The Mickelson catheter was then utilized to select the celiac artery and a celiac arteriogram was performed. Next, with the use of a fathom 14 microwire, a regular Renegade microcatheter was advanced to the level of gastroduodenal artery and a selective gastroduodenal arteriogram was performed The microcatheter was advanced beyond the placed endoscopic clips and the gastroduodenal artery was percutaneously coil embolized with multiple 3 mm, 4 mm, 5 mm, 6 mm and 10 mm interlock coils to near the vessel's origin. Multiple sub  selective injections were performed during the percutaneous coil embolization  Next, the microcatheter was retracted to the level of the common hepatic artery and a selective common hepatic arteriogram was performed. Next, the microcatheter was removed and a completion celiac arteriogram was performed to the [facility] catheter. The Mickelson catheter was then utilized to select the superior mesenteric artery and a selective superior mesenteric arteriogram was performed. Images were reviewed and the procedure was terminated. All wires, catheters and sheaths were removed from the patient. Hemostasis was achieved at the right groin access site with deployment of an ExoSeal closure device. The patient tolerated the procedure well without immediate post procedural complication. FINDINGS: Selective celiac arteriogram demonstrates a conventional branching pattern with conventional takeoff of the GDA which terminates in a right gastroepiploic artery. Selective gastroduodenal arteriogram confirms this finding and demonstrates several tiny pancreaticoduodenal branches contributing arterial supply to region adjacent to the endoscopy clips. A discrete area of contrast extravasation or vessel irregularity is not identified, however the decision was made to proceed with prophylactic embolization of the GDA which was performed with multiple overlapping coils to near the vessel's origin. Following percutaneous embolization, there is complete occlusion of GDA. Selective superior mesenteric arteriogram is negative for definitive retrograde supply to endoscopy clip. IMPRESSION: Technically successful percutaneous prophylactic coil embolization of the GDA for bleeding duodenal ulcer at the location of the endoscopy clip. PLAN: - The patient is to remain flat for 4 hours with right leg straight. - Continued resuscitative interventions is advised as the patient may continue to experience several additional bloody bowel movements, however  ultimately I am hopeful he will stabilize in the coming days. - Repeat endoscopy may be performed at discretion of the GI service as Electronically Signed   By: Sandi Mariscal M.D.   On: 10/19/2020 16:25   IR Angiogram Selective Each Additional Vessel  Result Date: 10/19/2020 INDICATION: Bleeding duodenal ulcer. Please perform mesenteric arteriogram and percutaneous embolization as indicated. EXAM: 1. ULTRASOUND GUIDANCE FOR ARTERIAL ACCESS 2. SELECTIVE SUPERIOR MESENTERIC ARTERIOGRAM 3. SELECTIVE CELIAC ARTERIOGRAM 4. SUB SELECTIVE COMMON HEPATIC ARTERIOGRAM 5. SUB SELECTIVE GASTRODUODENAL ARTERIOGRAM AND PERCUTANEOUS COIL EMBOLIZATION COMPARISON:  CT the chest, abdomen and pelvis-04/30/2020; 04/20/2020 MEDICATIONS: None ANESTHESIA/SEDATION: Moderate (conscious) sedation was employed during this procedure. A total of Versed 1 mg and Fentanyl 25 mcg was administered intravenously. Moderate Sedation Time: 62 minutes. The patient's level of consciousness and vital signs were monitored continuously by radiology nursing throughout the procedure under my direct supervision. CONTRAST:  80 cc Omnipaque 300 FLUOROSCOPY TIME:  19 minutes, 12 seconds (967 mGy) COMPLICATIONS: None immediate. PROCEDURE: Informed consent was obtained from the patient following explanation of the procedure, risks, benefits and alternatives. All questions were addressed. A time out was performed prior to the initiation of the procedure. Maximal barrier sterile technique utilized including caps, mask, sterile gowns, sterile gloves, large sterile drape, hand hygiene, and Betadine prep. The right femoral head was marked fluoroscopically. Under sterile conditions and local anesthesia, the right common femoral artery access was performed with a micropuncture needle. Under direct ultrasound guidance, the right common femoral was accessed with a micropuncture kit. An ultrasound image was saved for documentation purposes. This allowed for placement of a  5-French vascular sheath. A limited arteriogram was performed through the side arm of the sheath confirming appropriate access within the right common femoral artery. Over a Bentson wire, a Mickelson catheter was advanced the caudal aspect of the thoracic aorta where was reformed, back bled and flushed. The Mickelson catheter was then utilized to select the celiac artery and a celiac  arteriogram was performed. Next, with the use of a fathom 14 microwire, a regular Renegade microcatheter was advanced to the level of gastroduodenal artery and a selective gastroduodenal arteriogram was performed The microcatheter was advanced beyond the placed endoscopic clips and the gastroduodenal artery was percutaneously coil embolized with multiple 3 mm, 4 mm, 5 mm, 6 mm and 10 mm interlock coils to near the vessel's origin. Multiple sub selective injections were performed during the percutaneous coil embolization Next, the microcatheter was retracted to the level of the common hepatic artery and a selective common hepatic arteriogram was performed. Next, the microcatheter was removed and a completion celiac arteriogram was performed to the [facility] catheter. The Mickelson catheter was then utilized to select the superior mesenteric artery and a selective superior mesenteric arteriogram was performed. Images were reviewed and the procedure was terminated. All wires, catheters and sheaths were removed from the patient. Hemostasis was achieved at the right groin access site with deployment of an ExoSeal closure device. The patient tolerated the procedure well without immediate post procedural complication. FINDINGS: Selective celiac arteriogram demonstrates a conventional branching pattern with conventional takeoff of the GDA which terminates in a right gastroepiploic artery. Selective gastroduodenal arteriogram confirms this finding and demonstrates several tiny pancreaticoduodenal branches contributing arterial supply to region  adjacent to the endoscopy clips. A discrete area of contrast extravasation or vessel irregularity is not identified, however the decision was made to proceed with prophylactic embolization of the GDA which was performed with multiple overlapping coils to near the vessel's origin. Following percutaneous embolization, there is complete occlusion of GDA. Selective superior mesenteric arteriogram is negative for definitive retrograde supply to endoscopy clip. IMPRESSION: Technically successful percutaneous prophylactic coil embolization of the GDA for bleeding duodenal ulcer at the location of the endoscopy clip. PLAN: - The patient is to remain flat for 4 hours with right leg straight. - Continued resuscitative interventions is advised as the patient may continue to experience several additional bloody bowel movements, however ultimately I am hopeful he will stabilize in the coming days. - Repeat endoscopy may be performed at discretion of the GI service as Electronically Signed   By: Sandi Mariscal M.D.   On: 10/19/2020 16:25   IR US Guide Vasc Access Right  Result Date: 10/19/2020 INDICATION: Bleeding duodenal ulcer. Please perform mesenteric arteriogram and percutaneous embolization as indicated. EXAM: 1. ULTRASOUND GUIDANCE FOR ARTERIAL ACCESS 2. SELECTIVE SUPERIOR MESENTERIC ARTERIOGRAM 3. SELECTIVE CELIAC ARTERIOGRAM 4. SUB SELECTIVE COMMON HEPATIC ARTERIOGRAM 5. SUB SELECTIVE GASTRODUODENAL ARTERIOGRAM AND PERCUTANEOUS COIL EMBOLIZATION COMPARISON:  CT the chest, abdomen and pelvis-04/30/2020; 04/20/2020 MEDICATIONS: None ANESTHESIA/SEDATION: Moderate (conscious) sedation was employed during this procedure. A total of Versed 1 mg and Fentanyl 25 mcg was administered intravenously. Moderate Sedation Time: 62 minutes. The patient's level of consciousness and vital signs were monitored continuously by radiology nursing throughout the procedure under my direct supervision. CONTRAST:  80 cc Omnipaque 300 FLUOROSCOPY  TIME:  19 minutes, 12 seconds (081 mGy) COMPLICATIONS: None immediate. PROCEDURE: Informed consent was obtained from the patient following explanation of the procedure, risks, benefits and alternatives. All questions were addressed. A time out was performed prior to the initiation of the procedure. Maximal barrier sterile technique utilized including caps, mask, sterile gowns, sterile gloves, large sterile drape, hand hygiene, and Betadine prep. The right femoral head was marked fluoroscopically. Under sterile conditions and local anesthesia, the right common femoral artery access was performed with a micropuncture needle. Under direct ultrasound guidance, the right common femoral was accessed  with a micropuncture kit. An ultrasound image was saved for documentation purposes. This allowed for placement of a 5-French vascular sheath. A limited arteriogram was performed through the side arm of the sheath confirming appropriate access within the right common femoral artery. Over a Bentson wire, a Mickelson catheter was advanced the caudal aspect of the thoracic aorta where was reformed, back bled and flushed. The Mickelson catheter was then utilized to select the celiac artery and a celiac arteriogram was performed. Next, with the use of a fathom 14 microwire, a regular Renegade microcatheter was advanced to the level of gastroduodenal artery and a selective gastroduodenal arteriogram was performed The microcatheter was advanced beyond the placed endoscopic clips and the gastroduodenal artery was percutaneously coil embolized with multiple 3 mm, 4 mm, 5 mm, 6 mm and 10 mm interlock coils to near the vessel's origin. Multiple sub selective injections were performed during the percutaneous coil embolization Next, the microcatheter was retracted to the level of the common hepatic artery and a selective common hepatic arteriogram was performed. Next, the microcatheter was removed and a completion celiac arteriogram was  performed to the [facility] catheter. The Mickelson catheter was then utilized to select the superior mesenteric artery and a selective superior mesenteric arteriogram was performed. Images were reviewed and the procedure was terminated. All wires, catheters and sheaths were removed from the patient. Hemostasis was achieved at the right groin access site with deployment of an ExoSeal closure device. The patient tolerated the procedure well without immediate post procedural complication. FINDINGS: Selective celiac arteriogram demonstrates a conventional branching pattern with conventional takeoff of the GDA which terminates in a right gastroepiploic artery. Selective gastroduodenal arteriogram confirms this finding and demonstrates several tiny pancreaticoduodenal branches contributing arterial supply to region adjacent to the endoscopy clips. A discrete area of contrast extravasation or vessel irregularity is not identified, however the decision was made to proceed with prophylactic embolization of the GDA which was performed with multiple overlapping coils to near the vessel's origin. Following percutaneous embolization, there is complete occlusion of GDA. Selective superior mesenteric arteriogram is negative for definitive retrograde supply to endoscopy clip. IMPRESSION: Technically successful percutaneous prophylactic coil embolization of the GDA for bleeding duodenal ulcer at the location of the endoscopy clip. PLAN: - The patient is to remain flat for 4 hours with right leg straight. - Continued resuscitative interventions is advised as the patient may continue to experience several additional bloody bowel movements, however ultimately I am hopeful he will stabilize in the coming days. - Repeat endoscopy may be performed at discretion of the GI service as Electronically Signed   By: Sandi Mariscal M.D.   On: 10/19/2020 16:25   DG Chest Port 1 View  Result Date: 10/17/2020 CLINICAL DATA:  Weakness, fall EXAM:  PORTABLE CHEST 1 VIEW COMPARISON:  06/08/2020 FINDINGS: Low lung volumes. No consolidation or edema. No pleural effusion or pneumothorax. Stable cardiomediastinal contours. Post TAVR. Left chest wall dual lead pacemaker. IMPRESSION: No acute process in the chest. Electronically Signed   By: Macy Mis M.D.   On: 10/17/2020 08:38   IR EMBO ART  VEN HEMORR LYMPH EXTRAV  INC GUIDE ROADMAPPING  Result Date: 10/19/2020 INDICATION: Bleeding duodenal ulcer. Please perform mesenteric arteriogram and percutaneous embolization as indicated. EXAM: 1. ULTRASOUND GUIDANCE FOR ARTERIAL ACCESS 2. SELECTIVE SUPERIOR MESENTERIC ARTERIOGRAM 3. SELECTIVE CELIAC ARTERIOGRAM 4. SUB SELECTIVE COMMON HEPATIC ARTERIOGRAM 5. SUB SELECTIVE GASTRODUODENAL ARTERIOGRAM AND PERCUTANEOUS COIL EMBOLIZATION COMPARISON:  CT the chest, abdomen and pelvis-04/30/2020; 04/20/2020 MEDICATIONS: None  ANESTHESIA/SEDATION: Moderate (conscious) sedation was employed during this procedure. A total of Versed 1 mg and Fentanyl 25 mcg was administered intravenously. Moderate Sedation Time: 62 minutes. The patient's level of consciousness and vital signs were monitored continuously by radiology nursing throughout the procedure under my direct supervision. CONTRAST:  80 cc Omnipaque 300 FLUOROSCOPY TIME:  19 minutes, 12 seconds (528 mGy) COMPLICATIONS: None immediate. PROCEDURE: Informed consent was obtained from the patient following explanation of the procedure, risks, benefits and alternatives. All questions were addressed. A time out was performed prior to the initiation of the procedure. Maximal barrier sterile technique utilized including caps, mask, sterile gowns, sterile gloves, large sterile drape, hand hygiene, and Betadine prep. The right femoral head was marked fluoroscopically. Under sterile conditions and local anesthesia, the right common femoral artery access was performed with a micropuncture needle. Under direct ultrasound guidance, the  right common femoral was accessed with a micropuncture kit. An ultrasound image was saved for documentation purposes. This allowed for placement of a 5-French vascular sheath. A limited arteriogram was performed through the side arm of the sheath confirming appropriate access within the right common femoral artery. Over a Bentson wire, a Mickelson catheter was advanced the caudal aspect of the thoracic aorta where was reformed, back bled and flushed. The Mickelson catheter was then utilized to select the celiac artery and a celiac arteriogram was performed. Next, with the use of a fathom 14 microwire, a regular Renegade microcatheter was advanced to the level of gastroduodenal artery and a selective gastroduodenal arteriogram was performed The microcatheter was advanced beyond the placed endoscopic clips and the gastroduodenal artery was percutaneously coil embolized with multiple 3 mm, 4 mm, 5 mm, 6 mm and 10 mm interlock coils to near the vessel's origin. Multiple sub selective injections were performed during the percutaneous coil embolization Next, the microcatheter was retracted to the level of the common hepatic artery and a selective common hepatic arteriogram was performed. Next, the microcatheter was removed and a completion celiac arteriogram was performed to the [facility] catheter. The Mickelson catheter was then utilized to select the superior mesenteric artery and a selective superior mesenteric arteriogram was performed. Images were reviewed and the procedure was terminated. All wires, catheters and sheaths were removed from the patient. Hemostasis was achieved at the right groin access site with deployment of an ExoSeal closure device. The patient tolerated the procedure well without immediate post procedural complication. FINDINGS: Selective celiac arteriogram demonstrates a conventional branching pattern with conventional takeoff of the GDA which terminates in a right gastroepiploic artery.  Selective gastroduodenal arteriogram confirms this finding and demonstrates several tiny pancreaticoduodenal branches contributing arterial supply to region adjacent to the endoscopy clips. A discrete area of contrast extravasation or vessel irregularity is not identified, however the decision was made to proceed with prophylactic embolization of the GDA which was performed with multiple overlapping coils to near the vessel's origin. Following percutaneous embolization, there is complete occlusion of GDA. Selective superior mesenteric arteriogram is negative for definitive retrograde supply to endoscopy clip. IMPRESSION: Technically successful percutaneous prophylactic coil embolization of the GDA for bleeding duodenal ulcer at the location of the endoscopy clip. PLAN: - The patient is to remain flat for 4 hours with right leg straight. - Continued resuscitative interventions is advised as the patient may continue to experience several additional bloody bowel movements, however ultimately I am hopeful he will stabilize in the coming days. - Repeat endoscopy may be performed at discretion of the GI service as Electronically Signed  By: Sandi Mariscal M.D.   On: 10/19/2020 16:25    Labs:  CBC: Recent Labs    10/17/20 0812 10/18/20 0431 10/19/20 0427 10/20/20 0629  WBC 9.9 10.7* 9.3 8.6  HGB 7.3* 9.1* 9.2* 8.9*  HCT 21.6* 26.7* 28.3* 27.2*  PLT 122* 128* 144* 116*    COAGS: Recent Labs    03/10/20 0322 04/29/20 0402 05/01/20 0340 05/02/20 0325 05/02/20 1450 09/12/20 1028 10/17/20 0812 10/18/20 0431 10/19/20 1850  INR 2.5*  --   --   --    < > 1.1 1.5* 1.3* 1.2  APTT 43* 84* 69* 65*  --   --   --   --   --    < > = values in this interval not displayed.    BMP: Recent Labs    11/03/19 1231 12/18/19 1011 12/21/19 0759 12/25/19 0837 03/09/20 1544 04/04/20 0934 04/14/20 1059 10/17/20 0812 10/18/20 0431 10/19/20 0427 10/20/20 0629  NA 131* 134*   < > 130*   < > 136   < > 126*  135 141 135  K 3.5 3.5   < > 3.5   < > 3.2*   < > 3.9 3.9 4.2 3.6  CL 96* 95*  --  92*   < > 93*   < > 92* 99 106 102  CO2 24 28  --  27   < > 27   < > _0 GLUCOSE 127* 82  --  99   < > 159*   < > 470* 95 146* 254*  BUN 39* 43*  --  54*   < > 29*   < > 104* 90* 75* 52*  CALCIUM 8.8* 9.5  --  9.0   < > 9.0   < > 8.2* 8.5* 8.6* 7.9*  CREATININE 1.60* 1.63*  --  1.73*   < > 1.50*   < > 1.50* 1.39* 1.44* 1.38*  GFRNONAA 42* 41*  --  38*   < > 45*   < > 49* 53* 51* 54*  GFRAA 49* 48*  --  44*  --  52*  --   --   --   --   --    < > = values in this interval not displayed.    LIVER FUNCTION TESTS: Recent Labs    05/03/20 0305 09/12/20 1028 10/17/20 0812 10/19/20 1850  BILITOT 4.1* 2.4* 1.4* 1.7*  AST 28 42* 34 42*  ALT _1 ALKPHOS 118 307* 146* 124  PROT 6.0* 7.3 5.8* 6.2*  ALBUMIN 2.3* 3.7 2.7* 2.8*    Assessment and Plan:  GI bleed Now post GDA embolization in IR yesterday Doing well Labs stable Plan per GI  Electronically Signed: Lavonia Drafts, PA-C 10/20/2020, 9:37 AM   I spent a total of 15 Minutes at the the patient's bedside AND on the patient's hospital floor or unit, greater than 50% of which was counseling/coordinating care for GDA embolization

## 2020-10-21 DIAGNOSIS — D649 Anemia, unspecified: Secondary | ICD-10-CM | POA: Diagnosis not present

## 2020-10-21 DIAGNOSIS — Z952 Presence of prosthetic heart valve: Secondary | ICD-10-CM

## 2020-10-21 DIAGNOSIS — K921 Melena: Secondary | ICD-10-CM | POA: Diagnosis not present

## 2020-10-21 LAB — BASIC METABOLIC PANEL
Anion gap: 7 (ref 5–15)
BUN: 48 mg/dL — ABNORMAL HIGH (ref 8–23)
CO2: 26 mmol/L (ref 22–32)
Calcium: 7.8 mg/dL — ABNORMAL LOW (ref 8.9–10.3)
Chloride: 100 mmol/L (ref 98–111)
Creatinine, Ser: 1.44 mg/dL — ABNORMAL HIGH (ref 0.61–1.24)
GFR, Estimated: 51 mL/min — ABNORMAL LOW (ref >60)
Glucose, Bld: 216 mg/dL — ABNORMAL HIGH (ref 70–99)
Potassium: 3.8 mmol/L (ref 3.5–5.1)
Sodium: 133 mmol/L — ABNORMAL LOW (ref 135–145)

## 2020-10-21 LAB — CBC
HCT: 26.6 % — ABNORMAL LOW (ref 39.0–52.0)
Hemoglobin: 8.5 g/dL — ABNORMAL LOW (ref 13.0–17.0)
MCH: 32 pg (ref 26.0–34.0)
MCHC: 32 g/dL (ref 30.0–36.0)
MCV: 100 fL (ref 80.0–100.0)
Platelets: 112 10*3/uL — ABNORMAL LOW (ref 150–400)
RBC: 2.66 MIL/uL — ABNORMAL LOW (ref 4.22–5.81)
RDW: 18 % — ABNORMAL HIGH (ref 11.5–15.5)
WBC: 8.5 10*3/uL (ref 4.0–10.5)
nRBC: 0 % (ref 0.0–0.2)

## 2020-10-21 LAB — MITOCHONDRIAL ANTIBODIES: Mitochondrial M2 Ab, IgG: <20 Units (ref 0.0–20.0)

## 2020-10-21 LAB — GLUCOSE, CAPILLARY
Glucose-Capillary: 197 mg/dL — ABNORMAL HIGH (ref 70–99)
Glucose-Capillary: 242 mg/dL — ABNORMAL HIGH (ref 70–99)
Glucose-Capillary: 213 mg/dL — ABNORMAL HIGH (ref 70–99)
Glucose-Capillary: 181 mg/dL — ABNORMAL HIGH (ref 70–99)

## 2020-10-21 LAB — MAGNESIUM: Magnesium: 2.5 mg/dL — ABNORMAL HIGH (ref 1.7–2.4)

## 2020-10-21 MED ORDER — MAGNESIUM SULFATE 2 GM/50ML IV SOLN
2.0000 g | Freq: Once | INTRAVENOUS | Status: AC
  Administered 2020-10-21: 2 g via INTRAVENOUS
  Filled 2020-10-21: qty 50

## 2020-10-21 NOTE — Progress Notes (Signed)
Physical Therapy Treatment Patient Details Name: John [name] MRN: [id number] DOB: Oct 30, 1945 Today's Date: 10/21/2020    History of Present Illness John [name] is a 75 y.o. male with medical history significant of   combined systolic/diastolic congestive heart failure IIIB, A. fib/flutter - w pacemaker on Eliquis, ICM,-cath September 2021, severe aortic valve stenosis -post TAVR February 2022, HTN, HLD, DMII, gout, CKD IIIb...Marland KitchenMarland Kitchen Presented with generalized weaknesses, fall 3 days ago without any injuries patient denies hitting his head or sustaining any physical trauma.    EMS noted orthostatic hypotensive    PT Comments    Patient presents seated in chair (assisted by nursing staff), agreeable for therapy and his spouse present in room.  Patient demonstrates good return for standing over commode to urinate and over sink to wash hands while using RW, increased endurance/distance for ambulation in hallway without loss of balance and minor c/o pain right knee.  Patient tolerated staying up in chair after therapy with his spouse present.  Patient will benefit from continued physical therapy in hospital and recommended venue below to increase strength, balance, endurance for safe ADLs and gait.     Follow Up Recommendations  Home health PT;Supervision for mobility/OOB;Supervision - Intermittent     Equipment Recommendations  None recommended by PT    Recommendations for Other Services       Precautions / Restrictions Precautions Precautions: Fall;ICD/Pacemaker Restrictions Weight Bearing Restrictions: No    Mobility  Bed Mobility               General bed mobility comments: Patient present seated in chair (assisted by nursing staff)    Transfers Overall transfer level: Needs assistance Equipment used: Rolling walker (2 wheeled) Transfers: Sit to/from Stand;Stand Pivot Transfers Sit to Stand: Supervision;Min guard Stand pivot transfers: Supervision;Min guard        General transfer comment: increased time, labored movement  Ambulation/Gait Ambulation/Gait assistance: Supervision Gait Distance (Feet): 100 Feet Assistive device: Rolling walker (2 wheeled) Gait Pattern/deviations: Decreased step length - right;Decreased step length - left;Decreased stride length Gait velocity: decreased   General Gait Details: increased endurance/distance for ambulation with slightly labored cadence with c/o right knee pain, no loss of balance   Stairs             Wheelchair Mobility    Modified Rankin (Stroke Patients Only)       Balance Overall balance assessment: Needs assistance Sitting-balance support: Feet supported;No upper extremity supported Sitting balance-Leahy Scale: Fair Sitting balance - Comments: seated in chair   Standing balance support: During functional activity;Bilateral upper extremity supported Standing balance-Leahy Scale: Fair Standing balance comment: fair/good using RW                            Cognition Arousal/Alertness: Awake/alert Behavior During Therapy: WFL for tasks assessed/performed Overall Cognitive Status: Within Functional Limits for tasks assessed                                        Exercises General Exercises - Lower Extremity Long Arc Quad: Seated;AROM;Strengthening;Both;10 reps Hip Flexion/Marching: Seated;AROM;Strengthening;Both;10 reps Toe Raises: Seated;AROM;Strengthening;Both;10 reps Heel Raises: Seated;AROM;Strengthening;Both;10 reps    General Comments        Pertinent Vitals/Pain Pain Assessment: 0-10 Pain Score: 7  Pain Location: right knee Pain Descriptors / Indicators: Sore Pain Intervention(s): Limited activity within patient's tolerance;Monitored during  session;Repositioned    Home Living                      Prior Function            PT Goals (current goals can now be found in the care plan section) Acute Rehab PT Goals Patient  Stated Goal: return home PT Goal Formulation: With patient/family Time For Goal Achievement: 10/25/20 Potential to Achieve Goals: Good Progress towards PT goals: Progressing toward goals    Frequency    Min 3X/week      PT Plan      Co-evaluation              AM-PAC PT "6 Clicks" Mobility   Outcome Measure  Help needed turning from your back to your side while in a flat bed without using bedrails?: None Help needed moving from lying on your back to sitting on the side of a flat bed without using bedrails?: A Little Help needed moving to and from a bed to a chair (including a wheelchair)?: A Little Help needed standing up from a chair using your arms (e.g., wheelchair or bedside chair)?: A Little Help needed to walk in hospital room?: A Little Help needed climbing 3-5 steps with a railing? : A Little 6 Click Score: 19    End of Session   Activity Tolerance: Patient tolerated treatment well;Patient limited by fatigue Patient left: in chair;with call bell/phone within reach;with family/visitor present Nurse Communication: Mobility status PT Visit Diagnosis: Unsteadiness on feet (R26.81);Other abnormalities of gait and mobility (R26.89);Muscle weakness (generalized) (M62.81)     Time: 7673-4193 PT Time Calculation (min) (ACUTE ONLY): 30 min  Charges:  $Gait Training: 8-22 mins $Therapeutic Exercise: 8-22 mins                     2:19 PM, 10/21/20 Lonell Grandchild, MPT Physical Therapist with [facility] [phone] office [phone] mobile phone

## 2020-10-21 NOTE — TOC Transition Note (Signed)
Transition of Care [facility]) - CM/SW Discharge Note   Patient Details  Name: John [name] MRN: [id number] Date of Birth: June 18, 1945  [facility] CM/SW Contact:  Natasha Bence, LCSW Phone Number: 10/21/2020, 1:17 PM   Clinical Narrative:    CSW notified of patient's readiness for discharge. CSW notified Georgina Snell with [name] of patient's discharge. TOC signing off.    Final next level of care: [place] Barriers to Discharge: Barriers Resolved   Patient Goals and CMS Choice Patient states their goals for this hospitalization and ongoing recovery are:: Return home with [facility] CMS Medicare.gov Compare Post Acute Care list provided to:: Patient    Discharge Placement                    Patient and family notified of of transfer: 10/21/20  Discharge Plan and Services In-house Referral: Clinical Social Work              DME Arranged: N/A DME Agency: NA       HH Arranged: PT [place] Agency: Louise Date [facility] Agency Contacted: 10/21/20 Time [place]: 0312 Representative spoke with at [place]: [place]) Interventions     Readmission Risk Interventions Readmission Risk Prevention Plan 10/18/2020 11/14/2018  Transportation Screening Complete Complete  PCP or Specialist Appt within 3-5 Days - Complete  HRI or [place] - Complete  Social Work Consult for [place] Planning/Counseling - Complete  Palliative Care Screening - Not Applicable  Medication Review Press photographer) Complete Complete  HRI or Home Care Consult Complete -  SW Recovery Care/Counseling Consult Complete -  Palliative Care Screening Not Applicable -  [place] Not Applicable -  Some recent data might be hidden

## 2020-10-21 NOTE — Care Management Important Message (Signed)
Important Message  Patient Details  Name: NASEEM VARDEN MRN: [id number] Date of Birth: [birth year]   Medicare Important Message Given:  Yes     Tommy Medal 10/21/2020, 12:14 PM

## 2020-10-21 NOTE — Plan of Care (Signed)
  Problem: Clinical Measurements: Goal: Ability to maintain clinical measurements within normal limits will improve Outcome: Progressing   

## 2020-10-21 NOTE — Plan of Care (Signed)

## 2020-10-21 NOTE — Progress Notes (Signed)
History and Physical   Patient: John [name]                            PCP: Erven Colla, DO                    DOB: 02-Nov-1945            DOA: 10/17/2020 ZOX:[id number]             DOS: 10/21/2020, 5:27 PM  Patient coming from:   HOME  I have personally reviewed patient's medical records, in electronic medical records, including:  [facility], and care everywhere.   PCP: Erven Colla, DO   Chief complaint; weakness  Subjective:   -Wife at bedside, questions answered,  No fever  Or chills  -No BM -Left knee pain is better -Recurrent runs of nonsustained V. tach on monitor -Patient appears to have paced rhythm -Denies dizziness chest pains or palpitations  History of present illness:    John [name] is a 75 y.o. male with medical history significant of   combined systolic/diastolic congestive heart failure IIIB, A. fib/flutter - w pacemaker on Eliquis, ICM,-cath September 2021, severe aortic valve stenosis -post TAVR February 2022, HTN, HLD, DMII, gout, CKD IIIb...Marland KitchenMarland Kitchen Presented with generalized weaknesses, fall 3 days ago without any injuries patient denies hitting his head or sustaining any physical trauma.   EMS noted orthostatic hypotensive   Patient Denies having: Fever, Chills, Cough, SOB, Chest Pain, Abd pain, N/V/D, headache, dizziness, lightheadedness,  Dysuria, Joint pain, rash, open wounds  ED Course:  Blood pressure 131/65, pulse 82, temperature 97.7 F (36.5 C), temperature source Oral, resp. rate 20, height 6' (1.829 m), weight 88.9 kg, SpO2 100 %..vital Abnormal labs; CBC WBC 9.9, hemoglobin 7.3, hematocrit 21.6, GFR 49, sodium 126, potassium 3.9, BUN 104, creatinine 1.5, glucose 470, Hemoccult positive x2 Influenza A/B, SARS-CoV-2-negative    Assessment / Plan:   Principal Problem:   GIB (gastrointestinal bleeding) Active Problems:   DM type 2 causing vascular disease (HCC)   Mixed hyperlipidemia   Essential hypertension, benign    Atrial flutter (HCC)   Current use of long term anticoagulation   CKD (chronic kidney disease) stage 3, GFR 30-59 ml/min (HCC)   Hyponatremia   Thrombocytopenia (HCC)   Permanent atrial fibrillation (HCC)   S/P TAVR (transcatheter aortic valve replacement)   Polyarticular gout   Anemia  Principal Problem:   GIB (gastrointestinal bleeding) -Hemodynamically stable, status post EGD -In the face of chronic anticoagulation with Eliquis -Hemoccult positive,  -hemoglobin was 14.6 on 09/12/2020 -Withholding chronic anticoagulation Eliquis,  -Continue IV Protonix--- through 10/22/2020 and then transition to p.o. -Status post 2 units of PRBC transfusion 10/17/2020 EGD revealed- One non-bleeding cratered duodenal ulcer with a nonbleeding visible vessel (Forrest Class IIa) was found in the duodenal bulb - likely the GDA.  The lesion was 20 mm in largest dimension.  Area was successfully injected with 2 mL of a 1:10,000 solution of epinephrine for hemostasis.  For hemostasis, two hemostatic clips were attempted to be placed. Unfortunately, the edges of the ulcer were too fibrotic and friable so no adequate grasping could be achieved. One clip was deployed in proxity to the ulcer to attempt to "narrow the gap", but this did not help. A second clip was used but not deployed.  There was no bleeding at the end of the procedure.    -Status post IR  arteriogram and embolization on 10/19/2020 by Dr. Pascal Lux -Continue PPI continue to monitor H&H  -Recurrent runs of nonsustained V. tach -- -Patient appears to have paced rhythm -Denies dizziness chest pains or palpitations -Empirically give IV mag, -Keep potassium around 4 magnesium around 2 -Left-sided pacemaker noted  Acute on chronic anemia/anemia of chronic disease, exacerbated by current GI bleed -hemoglobin was 14.6 on 09/12/2020 Hemoccult positive -2 units of PRBC transfusion this admission -Hemoglobin drifting down slowly--- no obvious  bleeding -Monitor and transfuse as clinically indicated  History of systolic/diastolic congestive heart failure With holding diuretics due to hypotension, hyponatremia, elevated BUN/creatinine -Monitoring daily weight and I's and O's -Stable on room air  -Due to elevated BUN/creatinine, hyponatremia -we will withhold Zaroxolyn, Aldactone, and Demadex for next 24 hours -We will monitor for any volume overload or shortness of breath utilize as needed Lasix  History of severe aortic stenosis-status post TAVR -closely, asymptomatic  Active Problems:  DM type II -hyperglycemia -Home medication of Tyler Aas and Trulicity will be held, [place] q. Weekly taken at) -Checking CBG QA CHS, SSI coverage -Monitoring CBGs stable   Mixed hyperlipidemia -continue statin when tolerating p.o., stable    Essential hypertension, benign -monitoring BP closely, continue Coreg, will continue Aldactone in a.m.  Dysrhythmia-atrial A. fib/flutter/bradycardia - currently paced on a pacemaker   Current use of long term anticoagulation -due to A. fib on Eliquis (holding due to GI bleed) -Cardiology consulted for clearance for procedure. -Stable, cardiology stating Eliquis may be on hold as long as GI bleed is a concern  CKD (chronic kidney disease) stage 3, GFR 30-59 ml/min (HCC)  -BUN 104, creatinine 1.50 >> 1.39  monitoring, avoiding nephrotoxins, gentle IV fluid hydration -BUN/creatinine improving.Marland Kitchen   Hyponatremia -acute on chronic, monitoring, with holding diuretics, much improved  Thrombocytopenia (HCC) -monitoring closely, will transfuse as needed, stable     Polyarticular gout -c/n  allopurinol, colchicine as needed when tolerating p.o.  Generalized weaknesses/status post fall -Consulting PT OT for evaluation recommendations  -Left knee pain--- on exam left knee is warm, unable to treat with NSAIDs or steroids due to GI bleed -Opiates as prescribed for now  Cultures:   -none Antimicrobial: -none  Consults called: GI/cardiologist/IR -------------------------------------------------------------------------------------------------------------------------------------------- DVT prophylaxis: SCD/Compression stockings Code Status:   Code Status: Full Code   Admission status: Patient will be admitted as Inpatient, with a greater than 2 midnight length of stay. Level of care: Med-Surg   Family Communication: Discussed with wife at bedside --------------------------------------------------------------------------------------------------------------------------------------------------  Disposition Plan:  Anticipated discharge in a.m. if no further significant recurrent V. tach and H&H stable   status is: Inpatient  Remains inpatient appropriate because:Inpatient level of care appropriate due to severity of illness  Dispo: The patient is from: Home              Anticipated d/c is to: Home              Patient currently is not medically stable to d/c.   Difficult to place patient No  Scheduled Meds:  carvedilol  3.125 mg Oral Q breakfast   carvedilol  6.25 mg Oral Q supper   insulin aspart  0-15 Units Subcutaneous TID WC   insulin aspart  10 Units Intravenous Once   sodium chloride flush  3 mL Intravenous Q12H   sodium chloride flush  3 mL Intravenous Q12H   spironolactone  12.5 mg Oral Daily   torsemide  60 mg Oral BID   Continuous Infusions:  sodium chloride  cefTRIAXone (ROCEPHIN)  IV 2 g (10/21/20 1454)   magnesium sulfate bolus IVPB 2 g (10/21/20 1711)   pantoprazole 8 mg/hr (10/19/20 0500)   PRN Meds:.sodium chloride, acetaminophen **OR** acetaminophen, bisacodyl, HYDROmorphone (DILAUDID) injection, hydrOXYzine, ipratropium, levalbuterol, ondansetron **OR** ondansetron (ZOFRAN) IV, oxyCODONE, senna-docusate, sodium chloride flush, traZODone PRN MEDs: sodium chloride, acetaminophen **OR** acetaminophen, bisacodyl, HYDROmorphone (DILAUDID)  injection, hydrOXYzine, ipratropium, levalbuterol, ondansetron **OR** ondansetron (ZOFRAN) IV, oxyCODONE, senna-docusate, sodium chloride flush, traZODone reports that he quit smoking about 34 years ago. His smoking use included cigarettes. He has a 10.00 pack-year smoking history. He has never used smokeless tobacco. He reports previous alcohol use. He reports that he does not use drugs.   Family History  Problem Relation Age of Onset   Heart attack Mother    Hypertension Mother    Heart attack Father    Hypertension Father    Heart attack Brother    Colon cancer Neg Hx     Physical Exam:   Vitals:   10/20/20 2058 10/21/20 0500 10/21/20 0523 10/21/20 1430  BP: 106/66  112/66 (!) 119/56  Pulse: 60  (!) 56 71  Resp: 19  20 17   Temp: (!) 97.5 F (36.4 C)  97.8 F (36.6 C) 97.8 F (36.6 C)  TempSrc: Oral  Oral Oral  SpO2: 100%  100% 100%  Weight:  90.8 kg    Height:       Physical Exam:  Physical Exam  Gen:- Awake Alert,  in no apparent distress  HEENT:- [place].AT, No sclera icterus Neck-Supple Neck,No JVD,.  Lungs-  CTAB , fair air movement CV- S1, S2 normal Abd-  +ve B.Sounds, Abd Soft, No tenderness,    Extremity/Skin:- No  edema,   good pulses Psych-affect is appropriate, oriented x3 Neuro-no new focal deficits, no tremors MSK--improved left knee warmth and tenderness on exam, tenderness with range of motion, somewhat stiff, difficulty with flexion     Labs on admission:    I have personally reviewed following labs and imaging studies  CBC: Recent Labs  Lab 10/17/20 0812 10/18/20 0431 10/19/20 0427 10/20/20 0629 10/21/20 0711  WBC 9.9 10.7* 9.3 8.6 8.5  NEUTROABS 7.3  --   --   --   --   HGB 7.3* 9.1* 9.2* 8.9* 8.5*  HCT 21.6* 26.7* 28.3* 27.2* 26.6*  MCV 98.2 96.0 100.4* 101.9* 100.0  PLT 122* 128* 144* 116* 323*   Basic Metabolic Panel: Recent Labs  Lab 10/17/20 0812 10/18/20 0431 10/19/20 0427 10/20/20 0629 10/21/20 0711  NA 126* 135 141 135  133*  K 3.9 3.9 4.2 3.6 3.8  CL 92* 99 106 102 100  CO2 25 26 28 26 26   GLUCOSE 470* 95 146* 254* 216*  BUN 104* 90* 75* 52* 48*  CREATININE 1.50* 1.39* 1.44* 1.38* 1.44*  CALCIUM 8.2* 8.5* 8.6* 7.9* 7.8*   GFR: Estimated Creatinine Clearance: 49.4 mL/min (A) (by C-G formula based on SCr of 1.44 mg/dL (H)). Liver Function Tests: Recent Labs  Lab 10/17/20 0812 10/19/20 1850  AST 34 42*  ALT 20 22  ALKPHOS 146* 124  BILITOT 1.4* 1.7*  PROT 5.8* 6.2*  ALBUMIN 2.7* 2.8*   No results for input(s): LIPASE, AMYLASE in the last 168 hours. No results for input(s): AMMONIA in the last 168 hours. Coagulation Profile: Recent Labs  Lab 10/17/20 0812 10/18/20 0431 10/19/20 1850  INR 1.5* 1.3* 1.2    HbA1C: No results for input(s): HGBA1C in the last 72 hours.  CBG: Recent Labs  Lab 10/20/20 1128 10/20/20 1554 10/20/20 2124 10/21/20 0733 10/21/20 1141  GLUCAP 254* 159* 127* 197* 242*    Urine analysis:    Component Value Date/Time   COLORURINE YELLOW 05/02/2020 [place] 05/02/2020 1449   LABSPEC 1.005 05/02/2020 1449   PHURINE 7.0 05/02/2020 1449   GLUCOSEU >=500 (A) 05/02/2020 1449   HGBUR NEGATIVE 05/02/2020 1449   BILIRUBINUR NEGATIVE 05/02/2020 1449   KETONESUR NEGATIVE 05/02/2020 1449   PROTEINUR NEGATIVE 05/02/2020 1449   UROBILINOGEN 0.2 01/05/2014 0744   NITRITE NEGATIVE 05/02/2020 1449   LEUKOCYTESUR NEGATIVE 05/02/2020 1449     Radiologic Exams on Admission:   No results found.  EKG:   Independently reviewed.  Orders placed or performed during the hospital encounter of 10/17/20   ED EKG   ED EKG   EKG 12-Lead   EKG 12-Lead   EKG 12-Lead   EKG    SIGNED: Roxan Hockey, MD, [facility],  Pager (Please use [email or web address] to page to text)  If 7PM-7AM, please contact night-coverage [email or web address],  10/21/2020, 5:27 PM

## 2020-10-21 NOTE — Progress Notes (Signed)
Had notified Dr. Joesph Fillers yesterday 10/20/20 of patient's desire to work with Physical Therapy due to weakness. Patient is eager this morning to work with them. Knee pain was lessened over night, but he did receive one dose of pain medication.

## 2020-10-22 ENCOUNTER — Telehealth: Payer: Self-pay | Admitting: Internal Medicine

## 2020-10-22 DIAGNOSIS — D62 Acute posthemorrhagic anemia: Secondary | ICD-10-CM

## 2020-10-22 LAB — RENAL FUNCTION PANEL
Albumin: 2.7 g/dL — ABNORMAL LOW (ref 3.5–5.0)
Anion gap: 8 (ref 5–15)
BUN: 45 mg/dL — ABNORMAL HIGH (ref 8–23)
CO2: 24 mmol/L (ref 22–32)
Calcium: 7.7 mg/dL — ABNORMAL LOW (ref 8.9–10.3)
Chloride: 99 mmol/L (ref 98–111)
Creatinine, Ser: 1.4 mg/dL — ABNORMAL HIGH (ref 0.61–1.24)
GFR, Estimated: 53 mL/min — ABNORMAL LOW (ref >60)
Glucose, Bld: 193 mg/dL — ABNORMAL HIGH (ref 70–99)
Phosphorus: 3.9 mg/dL (ref 2.5–4.6)
Potassium: 3.5 mmol/L (ref 3.5–5.1)
Sodium: 131 mmol/L — ABNORMAL LOW (ref 135–145)

## 2020-10-22 LAB — CBC
HCT: 25.9 % — ABNORMAL LOW (ref 39.0–52.0)
Hemoglobin: 8.6 g/dL — ABNORMAL LOW (ref 13.0–17.0)
MCH: 32.6 pg (ref 26.0–34.0)
MCHC: 33.2 g/dL (ref 30.0–36.0)
MCV: 98.1 fL (ref 80.0–100.0)
Platelets: 117 10*3/uL — ABNORMAL LOW (ref 150–400)
RBC: 2.64 MIL/uL — ABNORMAL LOW (ref 4.22–5.81)
RDW: 17.2 % — ABNORMAL HIGH (ref 11.5–15.5)
WBC: 7.2 10*3/uL (ref 4.0–10.5)
nRBC: 0 % (ref 0.0–0.2)

## 2020-10-22 LAB — BASIC METABOLIC PANEL
Anion gap: 9 (ref 5–15)
BUN: 45 mg/dL — ABNORMAL HIGH (ref 8–23)
CO2: 23 mmol/L (ref 22–32)
Calcium: 7.7 mg/dL — ABNORMAL LOW (ref 8.9–10.3)
Chloride: 99 mmol/L (ref 98–111)
Creatinine, Ser: 1.45 mg/dL — ABNORMAL HIGH (ref 0.61–1.24)
GFR, Estimated: 51 mL/min — ABNORMAL LOW (ref >60)
Glucose, Bld: 193 mg/dL — ABNORMAL HIGH (ref 70–99)
Potassium: 3.5 mmol/L (ref 3.5–5.1)
Sodium: 131 mmol/L — ABNORMAL LOW (ref 135–145)

## 2020-10-22 LAB — GLUCOSE, CAPILLARY
Glucose-Capillary: 179 mg/dL — ABNORMAL HIGH (ref 70–99)
Glucose-Capillary: 361 mg/dL — ABNORMAL HIGH (ref 70–99)

## 2020-10-22 MED ORDER — SUCRALFATE 1 G PO TABS: 1.0000 g | ORAL_TABLET | Freq: Four times a day (QID) | ORAL | 1 refills | Status: DC

## 2020-10-22 MED ORDER — PANTOPRAZOLE SODIUM 40 MG PO TBEC: 40.0000 mg | DELAYED_RELEASE_TABLET | Freq: Two times a day (BID) | ORAL | 5 refills | Status: DC

## 2020-10-22 MED ORDER — POTASSIUM CHLORIDE CRYS ER 20 MEQ PO TBCR
40.0000 meq | EXTENDED_RELEASE_TABLET | Freq: Once | ORAL | Status: AC
  Administered 2020-10-22: 40 meq via ORAL
  Filled 2020-10-22: qty 2

## 2020-10-22 MED ORDER — PANTOPRAZOLE SODIUM 40 MG IV SOLR
40.0000 mg | Freq: Once | INTRAVENOUS | Status: AC
  Administered 2020-10-22: 40 mg via INTRAVENOUS
  Filled 2020-10-22: qty 40

## 2020-10-22 MED ORDER — ASPIRIN EC 81 MG PO TBEC: 81.0000 mg | DELAYED_RELEASE_TABLET | Freq: Every day | ORAL | 2 refills | Status: AC

## 2020-10-22 NOTE — Plan of Care (Signed)

## 2020-10-22 NOTE — Progress Notes (Signed)
Nsg Discharge Note  Admit Date:  10/17/2020 Discharge date: 10/22/2020   John [name] to be D/C'd Home per MD order.  AVS completed.  Copy for chart, and copy for patient signed, and dated. Reviewed d/c paperwork with patient and wife. Answered all questions. Wheeled stable patient and belongings to main entrance where he was picked up by his wife. Patient/caregiver able to verbalize understanding.  Discharge Medication: Allergies as of 10/22/2020   No Known Allergies      Medication List     STOP taking these medications    apixaban 5 MG Tabs tablet Commonly known as: Eliquis   aspirin 81 MG chewable tablet Replaced by: aspirin EC 81 MG tablet       TAKE these medications    Accu-Chek Aviva Plus test strip Generic drug: glucose blood TEST BLOOD SUGAR TWICE DAILY BEFORE BREAKFAST AND AT BEDTIME   acetaminophen 325 MG tablet Commonly known as: TYLENOL Take 2 tablets (650 mg total) by mouth every 4 (four) hours as needed for headache or mild pain.   allopurinol 300 MG tablet Commonly known as: ZYLOPRIM Take 0.5 tablets (150 mg total) by mouth daily.   aspirin EC 81 MG tablet Take 1 tablet (81 mg total) by mouth daily with breakfast. Start taking on: October 25, 2020 Replaces: aspirin 81 MG chewable tablet   BD Pen Needle Nano U/F 32G X 4 MM Misc Generic drug: Insulin Pen Needle 1 each by Does not apply route 4 (four) times daily.   benzonatate 100 MG capsule Commonly known as: TESSALON Take 1 capsule (100 mg total) by mouth 3 (three) times daily as needed for cough.   carvedilol 3.125 MG tablet Commonly known as: COREG TAKE 1 TABLET IN THE MORNING AND 2 TABLETS IN THE EVENING What changed:  how much to take how to take this when to take this additional instructions   colchicine 0.6 MG tablet Take 1 tablet (0.6 mg total) by mouth daily as needed. What changed: reasons to take this   Klor-Con M20 20 MEQ tablet Generic drug: potassium chloride SA TAKE  1.5 TABLETS (30 MEQ TOTAL) BY MOUTH 2 (TWO) TIMES DAILY. What changed: See the new instructions.   loratadine 10 MG tablet Commonly known as: CLARITIN TAKE 1 TABLET BY MOUTH EVERY DAY   metolazone 2.5 MG tablet Commonly known as: ZAROXOLYN Take 1 tablet (2.5 mg total) by mouth as directed. For wight of 200 lb and greater   One-A-Day Mens 50+ Tabs Take 1 tablet by mouth daily with breakfast.   pantoprazole 40 MG tablet Commonly known as: Protonix Take 1 tablet (40 mg total) by mouth 2 (two) times daily before a meal.   spironolactone 25 MG tablet Commonly known as: ALDACTONE Take 0.5 tablets (12.5 mg total) by mouth daily.   sucralfate 1 g tablet Commonly known as: Carafate Take 1 tablet (1 g total) by mouth 4 (four) times daily.   torsemide 20 MG tablet Commonly known as: DEMADEX Take 3 tablets (60 mg total) by mouth 2 (two) times daily.   Tyler Aas FlexTouch 100 UNIT/ML FlexTouch Pen Generic drug: insulin degludec Inject 20 Units into the skin daily.   Trulicity 1.5 WU/[id number] Sopn Generic drug: Dulaglutide INJECT 1.5MG (1 PEN) SUBCUTANEOUSLY EVERY WEEK What changed:  how much to take how to take this when to take this additional instructions        Discharge Assessment: Vitals:   10/21/20 2116 10/22/20 0519  BP: (!) 107/46 (!) 102/43  Pulse: Marland Kitchen)  58 (!) 102  Resp: 16 18  Temp: 97.9 F (36.6 C) 98.7 F (37.1 C)  SpO2: 100% 99%   Skin clean, dry and intact without evidence of skin break down, no evidence of skin tears noted. IV catheter discontinued intact. Site without signs and symptoms of complications - no redness or edema noted at insertion site, patient denies c/o pain - only slight tenderness at site.  Dressing with slight pressure applied.  D/c Instructions-Education: Discharge instructions given to patient/family with verbalized understanding. D/c education completed with patient/family including follow up instructions, medication list, d/c activities  limitations if indicated, with other d/c instructions as indicated by MD - patient able to verbalize understanding, all questions fully answered. Patient instructed to return to ED, call 911, or call MD for any changes in condition.  Patient escorted via [place], and D/C home via private auto.  Santa Lighter, RN 10/22/2020 1:33 PM

## 2020-10-22 NOTE — Discharge Summary (Signed)
John [name], is a 75 y.o. male  DOB Jan 19, 1946  MRN [id number].  Admission date:  10/17/2020  Admitting Physician  [name], MD  Discharge Date:  10/22/2020   Primary MD  Erven Colla, DO  Recommendations for primary care physician for things to follow:   1)Avoid ibuprofen/Advil/Aleve/Motrin/Goody Powders/Naproxen/BC powders/Meloxicam/Diclofenac/Indomethacin and other Nonsteroidal anti-inflammatory medications as these will make you more likely to bleed and can cause stomach ulcers, can also cause Kidney problems.   2)STOP Eliquis/Apixaban until after you have repeat upper endoscopy in September 2022 to make sure that your duodenal ulcer is healed  3)You will need repeat CBC blood test on Monday, 10/24/2020----you can take baby aspirin with food starting on Tuesday, 10/25/2020 if your hemoglobin (hgb) on the repeat CBC blood test on Monday is at or greater than 9  4)Follow up with Erven Colla, DO --- for repeat CBC and BMP blood test about a week from now around Monday, 10/31/2020  5)Follow-up Gastroenterologist Dr. Hurshel Keys with [facility]--- in about 4 weeks for Re-evaluation and to schedule repeat upper endoscopy for September, 2022 -address: [address] [place], [place] [address], Phone: [phone]   Admission Diagnosis  Hyponatremia [E87.1] Weakness [R53.1] GIB (gastrointestinal bleeding) [K92.2] Hyperglycemia [R73.9] Chronic combined systolic and diastolic congestive heart failure (HCC) [I50.42] Gastrointestinal hemorrhage, unspecified gastrointestinal hemorrhage type [K92.2] Anemia, unspecified type [D64.9] Acute GI bleeding [K92.2]   Discharge Diagnosis  Hyponatremia [E87.1] Weakness [R53.1] GIB (gastrointestinal bleeding) [K92.2] Hyperglycemia [R73.9] Chronic combined systolic and diastolic congestive heart failure ([place])  [I50.42] Gastrointestinal hemorrhage, unspecified gastrointestinal hemorrhage type [K92.2] Anemia, unspecified type [D64.9] Acute GI bleeding [K92.2]    Principal Problem:   GIB (gastrointestinal bleeding) Active Problems:   DM type 2 causing vascular disease ([place])   Mixed hyperlipidemia   Essential hypertension, benign   Atrial flutter (HCC)   Current use of long term anticoagulation   CKD (chronic kidney disease) stage 3, GFR 30-59 ml/min (HCC)   Hyponatremia   Thrombocytopenia (HCC)   Permanent atrial fibrillation (HCC)   S/P TAVR (transcatheter aortic valve replacement)   Polyarticular gout   Anemia   Acute GI bleeding      Past Medical History:  Diagnosis Date   Atrial flutter ([place]) 12/2010   Admitted with symptomatic bradycardia (HR 40s), atrial flutter with slow ventricular response 12/2010 + volume overload; AV nodal agents d/c'd and Pradaxa started; RFA in 01/2011   Chronic kidney disease, stage 3b (HCC)    Chronic systolic CHF (congestive heart failure) (HCC)    Class 2 severe obesity due to excess calories with serious comorbidity and body mass index (BMI) of 37.0 to 37.9 in adult ([place]) 07/17/2017   Diabetes mellitus    non insulin dependant   Hematoma, chest wall 05/15/2020   Hyperlipidemia    Hypertension    Mitral regurgitation    NASH (nonalcoholic steatohepatitis)    NICM (nonischemic cardiomyopathy) ([place])    Osteoarthritis    Polyarticular gout 05/15/2020   Presence of permanent cardiac  pacemaker    PSVT (paroxysmal supraventricular tachycardia) (HCC)    Possibly atrial flutter   S/P TAVR (transcatheter aortic valve replacement) 05/03/2020   s/p TAVR with a 29 mm Edwards S3U via the subclavian approach with Dr. Angelena Form & Dr. Cyndia Bent    Severe aortic stenosis     Past Surgical History:  Procedure Laterality Date   ATRIAL FLUTTER ABLATION N/A 02/07/2011   Procedure: ATRIAL FLUTTER ABLATION;  Surgeon: Evans Lance, MD;  Location: [facility] CATH LAB;   Service: Cardiovascular;  Laterality: N/A;   BIV UPGRADE N/A 06/08/2020   Procedure: BIV PPM UPGRADE;  Surgeon: Evans Lance, MD;  Location: [place] CV LAB;  Service: Cardiovascular;  Laterality: N/A;   CARDIAC ELECTROPHYSIOLOGY STUDY AND ABLATION  02/07/11   CARDIOVERSION N/A 03/23/2016   Procedure: CARDIOVERSION;  Surgeon: Evans Lance, MD;  Location: [facility] [facility];  Service: Cardiovascular;  Laterality: N/A;   COLONOSCOPY N/A 04/17/2017   Procedure: COLONOSCOPY;  Surgeon: Rogene Houston, MD;  [facility];  Service: Endoscopy;  Laterality: N/A;  [place] N/A 08/29/2015   Procedure: Pacemaker Implant;  Surgeon: Evans Lance, MD;  Location: [place] CV LAB;  Service: Cardiovascular;  Laterality: N/A;   EP IMPLANTABLE DEVICE N/A 12/07/2015   Procedure: PPM Lead Revision/Repair;  Surgeon: Evans Lance, MD;  Location: [place] CV LAB;  Service: Cardiovascular;  Laterality: N/A;   ESOPHAGOGASTRODUODENOSCOPY (EGD) WITH PROPOFOL N/A 10/18/2020   Procedure: ESOPHAGOGASTRODUODENOSCOPY (EGD) WITH PROPOFOL;  Surgeon: Harvel Quale, MD;  [facility];  Service: Gastroenterology;  Laterality: N/A;   IR ANGIOGRAM SELECTIVE EACH ADDITIONAL VESSEL  10/19/2020   IR ANGIOGRAM SELECTIVE EACH ADDITIONAL VESSEL  10/19/2020   IR ANGIOGRAM VISCERAL SELECTIVE  10/19/2020   IR ANGIOGRAM VISCERAL SELECTIVE  10/19/2020   IR EMBO ART  VEN HEMORR LYMPH EXTRAV  INC GUIDE ROADMAPPING  10/19/2020   IR US GUIDE VASC ACCESS RIGHT  10/19/2020   KNEE ARTHROSCOPY  2005   left   LUMBAR SPINE SURGERY     "I've had 6 ORs 1972 thru 2004"   POLYPECTOMY  04/17/2017   Procedure: POLYPECTOMY;  Surgeon: Rogene Houston, MD;  [facility];  Service: Endoscopy;;  transverse colon x3;   RIGHT HEART CATH N/A 04/28/2020   Procedure: RIGHT HEART CATH;  Surgeon: Jolaine Artist, MD;  Location: [place] CV LAB;  Service: Cardiovascular;  Laterality: N/A;    RIGHT/LEFT HEART CATH AND CORONARY ANGIOGRAPHY N/A 12/21/2019   Procedure: RIGHT/LEFT HEART CATH AND CORONARY ANGIOGRAPHY;  Surgeon: Nelva Bush, MD;  Location: [place] CV LAB;  Service: Cardiovascular;  Laterality: N/A;   TEE WITHOUT CARDIOVERSION N/A 11/05/2019   Procedure: TRANSESOPHAGEAL ECHOCARDIOGRAM (TEE) WITH PROPOFOL;  Surgeon: Arnoldo Lenis, MD;  [facility];  Service: Endoscopy;  Laterality: N/A;   TEE WITHOUT CARDIOVERSION N/A 05/03/2020   Procedure: TRANSESOPHAGEAL ECHOCARDIOGRAM (TEE);  Surgeon: Burnell Blanks, MD;  Location: [facility];  Service: Open Heart Surgery;  Laterality: N/A;      HPI  from the history and physical done on the day of admission:      John [name] is a 75 y.o. male with medical history significant of   combined systolic/diastolic congestive heart failure IIIB, A. fib/flutter - w pacemaker on Eliquis, ICM,-cath September 2021, severe aortic valve stenosis -post TAVR February 2022, HTN, HLD, DMII, gout, CKD IIIb...Marland KitchenMarland Kitchen Presented with generalized weaknesses, fall 3 days ago without any  injuries patient denies hitting his head or sustaining any physical trauma.   EMS noted orthostatic hypotensive    Patient Denies having: Fever, Chills, Cough, SOB, Chest Pain, Abd pain, N/V/D, headache, dizziness, lightheadedness,  Dysuria, Joint pain, rash, open wounds   ED Course:  Blood pressure 131/65, pulse 82, temperature 97.7 F (36.5 C), temperature source Oral, resp. rate 20, height 6' (1.829 m), weight 88.9 kg, SpO2 100 %..vital Abnormal labs; CBC WBC 9.9, hemoglobin 7.3, hematocrit 21.6, GFR 49, sodium 126, potassium 3.9, BUN 104, creatinine 1.5, glucose 470, Hemoccult positive x2 Influenza A/B, SARS-CoV-2-negative      Review of Systems: As per HPI, otherwise 10 point review of systems were negative.       Hospital Course:   Principal Problem:    GIB (gastrointestinal bleeding) -Hemodynamically stable, status post EGD -In  the face of chronic anticoagulation with Eliquis -Hemoccult positive, -hemoglobin was 14.6 on 09/12/2020 Received 2 units of PRBCs on 10/17/20 for Hgb of 7.3 -Hgb currently stable above 8 (8.5 >> 8.6) PTA was on chronic anticoagulation Eliquis -Continue to hold Eliquis until repeat EGD in about 8 weeks confirms healing of his duodenal ulcer -Treated with IV Protonix through 10/22/2020 -Okay to discharge on p.o. Protonix  EGD revealed- One non-bleeding cratered duodenal ulcer with a nonbleeding visible vessel ([place] Class IIa) was found in the duodenal bulb - likely the GDA.  The lesion was 20 mm in largest dimension.  Area was successfully injected with 2 mL of a 1:10,000 solution of epinephrine for hemostasis.  For hemostasis, two hemostatic clips were attempted to be placed. Unfortunately, the edges of the ulcer were too fibrotic and friable so no adequate grasping could be achieved. One clip was deployed in proxity to the ulcer to attempt to "narrow the gap", but this did not help. A second clip was used but not deployed.  There was no bleeding at the end of the procedure.  -Status post IR arteriogram and embolization on 10/19/2020 by Dr. Pascal Lux -Continue PPI continue to monitor H&H   -Recurrent runs of nonsustained V. tach -- -Patient appears to have paced rhythm -Denies dizziness chest pains or palpitations --Left-sided pacemaker noted -No further arrhythmias, -Goal is to maintain potassium close to 4 magnesium close to 2   Acute on chronic anemia/anemia of chronic disease, exacerbated by current GI bleed -hemoglobin was 14.6 on 09/12/2020 Hemoccult positive - please see #1 above   HFrEF/systolic/diastolic congestive heart failure -Echo from 06/12/2020 with EF 25 to 30%, status post-TAVR -Creatinine currently stable around 1.4 -Restart diuretics -Repeat BMP with PCP within a week    History of severe aortic stenosis-status post TAVR -closely, asymptomatic   Active Problems:   DM  type II -hyperglycemia --.  A1c 8.2-reflecting uncontrolled diabetes with hyperglycemia PTA -Resume home diabetic regimen follow-up with PCP for further adjustment    Mixed hyperlipidemia -continue statin when tolerating p.o., stable    Essential hypertension, benign --resume PTA at hypertensive medication  Dysrhythmia-atrial A. fib/flutter/bradycardia - currently paced on a pacemaker -Cardiology consulted for clearance for procedure. -Stable, cardiology stating Eliquis may be on hold as long as GI bleed is a concern -Eliquis continues to be on hold as noted above #1   CKD (chronic kidney disease) stage 3, GFR 30-59 ml/min (HCC) --Creatinine currently stable around 1.4   Hyponatremia -a sodium stable above 130, repeat BMP with PCP within a week   thrombocytopenia (HCC) -platelets stable above 100 K, repeat CBC on 10/24/2020   Polyarticular gout -c/n  allopurinol, colchicine as needed when tolerating p.o. -Gout flareup of left knee, avoid NSAIDs or steroids due to GI bleed   Generalized weaknesses/status post fall - PT eval appreciated recommends home health PT    Cultures: -none Antimicrobial: -none   Consults called: GI/cardiologist/IR --------------------------------------------------------------------------------------------------------------------------------------------   Family Communication: Discussed with wife at bedside --------------------------------------------------------------------------------------------------------------------------------------------------   Disposition Plan: Home with family with home health services   Dispo: The patient is from: Home  Discharge Condition: stable  Follow UP   Follow-up Information     [facility] Follow up.   Specialty: Home Health Services Why: Will contact you to schedule home health visits. Contact information: [address] [place] [address] [phone]         Elvia Collum  M, DO Follow up on 10/31/2020.   Specialty: Family Medicine Contact information: [place] [address] [phone]         Arnoldo Lenis, MD .   Specialty: Cardiology Contact information: [place] [address] [phone]         Bensimhon, Shaune Pascal, MD .   Specialty: Cardiology Contact information: [address] [place] [address] [phone]         Evans Lance, MD .   Specialty: Cardiology Contact information: [place] [address] [phone]         Carver, Charles K, DO Follow up in 1 month(s).   Specialty: Gastroenterology Contact information: [address] [place] [address] [phone]                 Consults obtained - Gi/IR  Diet and Activity recommendation:  As advised  Discharge Instructions    Discharge Instructions     Call MD for:  difficulty breathing, headache or visual disturbances   Complete by: As directed    Call MD for:  persistant dizziness or light-headedness   Complete by: As directed    Call MD for:  persistant nausea and vomiting   Complete by: As directed    Call MD for:  severe uncontrolled pain   Complete by: As directed    Call MD for:  temperature >100.4   Complete by: As directed    Diet - low sodium heart healthy   Complete by: As directed    Diet - low sodium heart healthy   Complete by: As directed    Diet Carb Modified   Complete by: As directed    Discharge instructions   Complete by: As directed    1)Avoid ibuprofen/Advil/Aleve/Motrin/Goody Powders/Naproxen/BC powders/Meloxicam/Diclofenac/Indomethacin and other Nonsteroidal anti-inflammatory medications as these will make you more likely to bleed and can cause stomach ulcers, can also cause Kidney problems.   2)STOP Eliquis/Apixaban until after you have repeat upper endoscopy in September 2022 to make sure that your duodenal ulcer is healed  3)You will need  repeat CBC blood test on Monday, 10/24/2020----you can take baby aspirin with food starting on Tuesday, 10/25/2020 if your hemoglobin (hgb) on the repeat CBC blood test on Monday is at or greater than 9  4)Follow up with Elvia Collum M, DO --- for repeat CBC and BMP blood test about a week from now around Monday, 10/31/2020  5)Follow-up Gastroenterologist Dr. Hurshel Keys with [facility]--- in about 4 weeks for Re-evaluation and to schedule repeat upper endoscopy for September, 2022 -address: [address] [place], [place] [address], Phone: [phone]   Increase activity  slowly   Complete by: As directed    Increase activity slowly   Complete by: As directed          Discharge Medications     Allergies as of 10/22/2020   No Known Allergies      Medication List     STOP taking these medications    apixaban 5 MG Tabs tablet Commonly known as: Eliquis   aspirin 81 MG chewable tablet Replaced by: aspirin EC 81 MG tablet       TAKE these medications    Accu-Chek Aviva Plus test strip Generic drug: glucose blood TEST BLOOD SUGAR TWICE DAILY BEFORE BREAKFAST AND AT BEDTIME   acetaminophen 325 MG tablet Commonly known as: TYLENOL Take 2 tablets (650 mg total) by mouth every 4 (four) hours as needed for headache or mild pain.   allopurinol 300 MG tablet Commonly known as: ZYLOPRIM Take 0.5 tablets (150 mg total) by mouth daily.   aspirin EC 81 MG tablet Take 1 tablet (81 mg total) by mouth daily with breakfast. Start taking on: October 25, 2020 Replaces: aspirin 81 MG chewable tablet   BD Pen Needle Nano U/F 32G X 4 MM Misc Generic drug: Insulin Pen Needle 1 each by Does not apply route 4 (four) times daily.   benzonatate 100 MG capsule Commonly known as: TESSALON Take 1 capsule (100 mg total) by mouth 3 (three) times daily as needed for cough.   carvedilol 3.125 MG tablet Commonly known as: COREG TAKE 1 TABLET IN THE MORNING AND 2  TABLETS IN THE EVENING What changed:  how much to take how to take this when to take this additional instructions   colchicine 0.6 MG tablet Take 1 tablet (0.6 mg total) by mouth daily as needed. What changed: reasons to take this   Klor-Con M20 20 MEQ tablet Generic drug: potassium chloride SA TAKE 1.5 TABLETS (30 MEQ TOTAL) BY MOUTH 2 (TWO) TIMES DAILY. What changed: See the new instructions.   loratadine 10 MG tablet Commonly known as: CLARITIN TAKE 1 TABLET BY MOUTH EVERY DAY   metolazone 2.5 MG tablet Commonly known as: ZAROXOLYN Take 1 tablet (2.5 mg total) by mouth as directed. For wight of 200 lb and greater   One-A-Day Mens 50+ Tabs Take 1 tablet by mouth daily with breakfast.   pantoprazole 40 MG tablet Commonly known as: Protonix Take 1 tablet (40 mg total) by mouth 2 (two) times daily before a meal.   spironolactone 25 MG tablet Commonly known as: ALDACTONE Take 0.5 tablets (12.5 mg total) by mouth daily.   sucralfate 1 g tablet Commonly known as: Carafate Take 1 tablet (1 g total) by mouth 4 (four) times daily.   torsemide 20 MG tablet Commonly known as: DEMADEX Take 3 tablets (60 mg total) by mouth 2 (two) times daily.   Tyler Aas FlexTouch 100 UNIT/ML FlexTouch Pen Generic drug: insulin degludec Inject 20 Units into the skin daily.   Trulicity 1.5 SA/6.3KZ Sopn Generic drug: Dulaglutide INJECT 1.5MG (1 PEN) SUBCUTANEOUSLY EVERY WEEK What changed:  how much to take how to take this when to take this additional instructions        Major procedures and Radiology Reports - PLEASE review detailed and final reports for all details, in brief -   IR Angiogram Visceral Selective  Result Date: 10/19/2020 INDICATION: Bleeding duodenal ulcer. Please perform mesenteric arteriogram and percutaneous embolization as indicated. EXAM: 1. ULTRASOUND GUIDANCE FOR ARTERIAL ACCESS 2. SELECTIVE SUPERIOR MESENTERIC ARTERIOGRAM 3. SELECTIVE  CELIAC ARTERIOGRAM 4.  SUB SELECTIVE COMMON HEPATIC ARTERIOGRAM 5. SUB SELECTIVE GASTRODUODENAL ARTERIOGRAM AND PERCUTANEOUS COIL EMBOLIZATION COMPARISON:  CT the chest, abdomen and pelvis-04/30/2020; 04/20/2020 MEDICATIONS: None ANESTHESIA/SEDATION: Moderate (conscious) sedation was employed during this procedure. A total of Versed 1 mg and Fentanyl 25 mcg was administered intravenously. Moderate Sedation Time: 62 minutes. The patient's level of consciousness and vital signs were monitored continuously by radiology nursing throughout the procedure under my direct supervision. CONTRAST:  80 cc Omnipaque 300 FLUOROSCOPY TIME:  19 minutes, 12 seconds (191 mGy) COMPLICATIONS: None immediate. PROCEDURE: Informed consent was obtained from the patient following explanation of the procedure, risks, benefits and alternatives. All questions were addressed. A time out was performed prior to the initiation of the procedure. Maximal barrier sterile technique utilized including caps, mask, sterile gowns, sterile gloves, large sterile drape, hand hygiene, and Betadine prep. The right femoral head was marked fluoroscopically. Under sterile conditions and local anesthesia, the right common femoral artery access was performed with a micropuncture needle. Under direct ultrasound guidance, the right common femoral was accessed with a micropuncture kit. An ultrasound image was saved for documentation purposes. This allowed for placement of a 5-French vascular sheath. A limited arteriogram was performed through the side arm of the sheath confirming appropriate access within the right common femoral artery. Over a Bentson wire, a Mickelson catheter was advanced the caudal aspect of the thoracic aorta where was reformed, back bled and flushed. The Mickelson catheter was then utilized to select the celiac artery and a celiac arteriogram was performed. Next, with the use of a fathom 14 microwire, a regular Renegade microcatheter was advanced to the level of  gastroduodenal artery and a selective gastroduodenal arteriogram was performed The microcatheter was advanced beyond the placed endoscopic clips and the gastroduodenal artery was percutaneously coil embolized with multiple 3 mm, 4 mm, 5 mm, 6 mm and 10 mm interlock coils to near the vessel's origin. Multiple sub selective injections were performed during the percutaneous coil embolization Next, the microcatheter was retracted to the level of the common hepatic artery and a selective common hepatic arteriogram was performed. Next, the microcatheter was removed and a completion celiac arteriogram was performed to the [facility] catheter. The Mickelson catheter was then utilized to select the superior mesenteric artery and a selective superior mesenteric arteriogram was performed. Images were reviewed and the procedure was terminated. All wires, catheters and sheaths were removed from the patient. Hemostasis was achieved at the right groin access site with deployment of an ExoSeal closure device. The patient tolerated the procedure well without immediate post procedural complication. FINDINGS: Selective celiac arteriogram demonstrates a conventional branching pattern with conventional takeoff of the GDA which terminates in a right gastroepiploic artery. Selective gastroduodenal arteriogram confirms this finding and demonstrates several tiny pancreaticoduodenal branches contributing arterial supply to region adjacent to the endoscopy clips. A discrete area of contrast extravasation or vessel irregularity is not identified, however the decision was made to proceed with prophylactic embolization of the GDA which was performed with multiple overlapping coils to near the vessel's origin. Following percutaneous embolization, there is complete occlusion of GDA. Selective superior mesenteric arteriogram is negative for definitive retrograde supply to endoscopy clip. IMPRESSION: Technically successful percutaneous prophylactic  coil embolization of the GDA for bleeding duodenal ulcer at the location of the endoscopy clip. PLAN: - The patient is to remain flat for 4 hours with right leg straight. - Continued resuscitative interventions is advised as the patient may continue to experience several additional bloody  bowel movements, however ultimately I am hopeful he will stabilize in the coming days. - Repeat endoscopy may be performed at discretion of the GI service as Electronically Signed   By: Sandi Mariscal M.D.   On: 10/19/2020 16:25   IR Angiogram Visceral Selective  Result Date: 10/19/2020 INDICATION: Bleeding duodenal ulcer. Please perform mesenteric arteriogram and percutaneous embolization as indicated. EXAM: 1. ULTRASOUND GUIDANCE FOR ARTERIAL ACCESS 2. SELECTIVE SUPERIOR MESENTERIC ARTERIOGRAM 3. SELECTIVE CELIAC ARTERIOGRAM 4. SUB SELECTIVE COMMON HEPATIC ARTERIOGRAM 5. SUB SELECTIVE GASTRODUODENAL ARTERIOGRAM AND PERCUTANEOUS COIL EMBOLIZATION COMPARISON:  CT the chest, abdomen and pelvis-04/30/2020; 04/20/2020 MEDICATIONS: None ANESTHESIA/SEDATION: Moderate (conscious) sedation was employed during this procedure. A total of Versed 1 mg and Fentanyl 25 mcg was administered intravenously. Moderate Sedation Time: 62 minutes. The patient's level of consciousness and vital signs were monitored continuously by radiology nursing throughout the procedure under my direct supervision. CONTRAST:  80 cc Omnipaque 300 FLUOROSCOPY TIME:  19 minutes, 12 seconds (378 mGy) COMPLICATIONS: None immediate. PROCEDURE: Informed consent was obtained from the patient following explanation of the procedure, risks, benefits and alternatives. All questions were addressed. A time out was performed prior to the initiation of the procedure. Maximal barrier sterile technique utilized including caps, mask, sterile gowns, sterile gloves, large sterile drape, hand hygiene, and Betadine prep. The right femoral head was marked fluoroscopically. Under sterile  conditions and local anesthesia, the right common femoral artery access was performed with a micropuncture needle. Under direct ultrasound guidance, the right common femoral was accessed with a micropuncture kit. An ultrasound image was saved for documentation purposes. This allowed for placement of a 5-French vascular sheath. A limited arteriogram was performed through the side arm of the sheath confirming appropriate access within the right common femoral artery. Over a Bentson wire, a Mickelson catheter was advanced the caudal aspect of the thoracic aorta where was reformed, back bled and flushed. The Mickelson catheter was then utilized to select the celiac artery and a celiac arteriogram was performed. Next, with the use of a fathom 14 microwire, a regular Renegade microcatheter was advanced to the level of gastroduodenal artery and a selective gastroduodenal arteriogram was performed The microcatheter was advanced beyond the placed endoscopic clips and the gastroduodenal artery was percutaneously coil embolized with multiple 3 mm, 4 mm, 5 mm, 6 mm and 10 mm interlock coils to near the vessel's origin. Multiple sub selective injections were performed during the percutaneous coil embolization Next, the microcatheter was retracted to the level of the common hepatic artery and a selective common hepatic arteriogram was performed. Next, the microcatheter was removed and a completion celiac arteriogram was performed to the [facility] catheter. The Mickelson catheter was then utilized to select the superior mesenteric artery and a selective superior mesenteric arteriogram was performed. Images were reviewed and the procedure was terminated. All wires, catheters and sheaths were removed from the patient. Hemostasis was achieved at the right groin access site with deployment of an ExoSeal closure device. The patient tolerated the procedure well without immediate post procedural complication. FINDINGS: Selective celiac  arteriogram demonstrates a conventional branching pattern with conventional takeoff of the GDA which terminates in a right gastroepiploic artery. Selective gastroduodenal arteriogram confirms this finding and demonstrates several tiny pancreaticoduodenal branches contributing arterial supply to region adjacent to the endoscopy clips. A discrete area of contrast extravasation or vessel irregularity is not identified, however the decision was made to proceed with prophylactic embolization of the GDA which was performed with multiple overlapping coils to near  the vessel's origin. Following percutaneous embolization, there is complete occlusion of GDA. Selective superior mesenteric arteriogram is negative for definitive retrograde supply to endoscopy clip. IMPRESSION: Technically successful percutaneous prophylactic coil embolization of the GDA for bleeding duodenal ulcer at the location of the endoscopy clip. PLAN: - The patient is to remain flat for 4 hours with right leg straight. - Continued resuscitative interventions is advised as the patient may continue to experience several additional bloody bowel movements, however ultimately I am hopeful he will stabilize in the coming days. - Repeat endoscopy may be performed at discretion of the GI service as Electronically Signed   By: Sandi Mariscal M.D.   On: 10/19/2020 16:25   IR Angiogram Selective Each Additional Vessel  Result Date: 10/19/2020 INDICATION: Bleeding duodenal ulcer. Please perform mesenteric arteriogram and percutaneous embolization as indicated. EXAM: 1. ULTRASOUND GUIDANCE FOR ARTERIAL ACCESS 2. SELECTIVE SUPERIOR MESENTERIC ARTERIOGRAM 3. SELECTIVE CELIAC ARTERIOGRAM 4. SUB SELECTIVE COMMON HEPATIC ARTERIOGRAM 5. SUB SELECTIVE GASTRODUODENAL ARTERIOGRAM AND PERCUTANEOUS COIL EMBOLIZATION COMPARISON:  CT the chest, abdomen and pelvis-04/30/2020; 04/20/2020 MEDICATIONS: None ANESTHESIA/SEDATION: Moderate (conscious) sedation was employed during this  procedure. A total of Versed 1 mg and Fentanyl 25 mcg was administered intravenously. Moderate Sedation Time: 62 minutes. The patient's level of consciousness and vital signs were monitored continuously by radiology nursing throughout the procedure under my direct supervision. CONTRAST:  80 cc Omnipaque 300 FLUOROSCOPY TIME:  19 minutes, 12 seconds (106 mGy) COMPLICATIONS: None immediate. PROCEDURE: Informed consent was obtained from the patient following explanation of the procedure, risks, benefits and alternatives. All questions were addressed. A time out was performed prior to the initiation of the procedure. Maximal barrier sterile technique utilized including caps, mask, sterile gowns, sterile gloves, large sterile drape, hand hygiene, and Betadine prep. The right femoral head was marked fluoroscopically. Under sterile conditions and local anesthesia, the right common femoral artery access was performed with a micropuncture needle. Under direct ultrasound guidance, the right common femoral was accessed with a micropuncture kit. An ultrasound image was saved for documentation purposes. This allowed for placement of a 5-French vascular sheath. A limited arteriogram was performed through the side arm of the sheath confirming appropriate access within the right common femoral artery. Over a Bentson wire, a Mickelson catheter was advanced the caudal aspect of the thoracic aorta where was reformed, back bled and flushed. The Mickelson catheter was then utilized to select the celiac artery and a celiac arteriogram was performed. Next, with the use of a fathom 14 microwire, a regular Renegade microcatheter was advanced to the level of gastroduodenal artery and a selective gastroduodenal arteriogram was performed The microcatheter was advanced beyond the placed endoscopic clips and the gastroduodenal artery was percutaneously coil embolized with multiple 3 mm, 4 mm, 5 mm, 6 mm and 10 mm interlock coils to near the  vessel's origin. Multiple sub selective injections were performed during the percutaneous coil embolization Next, the microcatheter was retracted to the level of the common hepatic artery and a selective common hepatic arteriogram was performed. Next, the microcatheter was removed and a completion celiac arteriogram was performed to the [facility] catheter. The Mickelson catheter was then utilized to select the superior mesenteric artery and a selective superior mesenteric arteriogram was performed. Images were reviewed and the procedure was terminated. All wires, catheters and sheaths were removed from the patient. Hemostasis was achieved at the right groin access site with deployment of an ExoSeal closure device. The patient tolerated the procedure well without immediate post procedural  complication. FINDINGS: Selective celiac arteriogram demonstrates a conventional branching pattern with conventional takeoff of the GDA which terminates in a right gastroepiploic artery. Selective gastroduodenal arteriogram confirms this finding and demonstrates several tiny pancreaticoduodenal branches contributing arterial supply to region adjacent to the endoscopy clips. A discrete area of contrast extravasation or vessel irregularity is not identified, however the decision was made to proceed with prophylactic embolization of the GDA which was performed with multiple overlapping coils to near the vessel's origin. Following percutaneous embolization, there is complete occlusion of GDA. Selective superior mesenteric arteriogram is negative for definitive retrograde supply to endoscopy clip. IMPRESSION: Technically successful percutaneous prophylactic coil embolization of the GDA for bleeding duodenal ulcer at the location of the endoscopy clip. PLAN: - The patient is to remain flat for 4 hours with right leg straight. - Continued resuscitative interventions is advised as the patient may continue to experience several additional  bloody bowel movements, however ultimately I am hopeful he will stabilize in the coming days. - Repeat endoscopy may be performed at discretion of the GI service as Electronically Signed   By: Sandi Mariscal M.D.   On: 10/19/2020 16:25   IR Angiogram Selective Each Additional Vessel  Result Date: 10/19/2020 INDICATION: Bleeding duodenal ulcer. Please perform mesenteric arteriogram and percutaneous embolization as indicated. EXAM: 1. ULTRASOUND GUIDANCE FOR ARTERIAL ACCESS 2. SELECTIVE SUPERIOR MESENTERIC ARTERIOGRAM 3. SELECTIVE CELIAC ARTERIOGRAM 4. SUB SELECTIVE COMMON HEPATIC ARTERIOGRAM 5. SUB SELECTIVE GASTRODUODENAL ARTERIOGRAM AND PERCUTANEOUS COIL EMBOLIZATION COMPARISON:  CT the chest, abdomen and pelvis-04/30/2020; 04/20/2020 MEDICATIONS: None ANESTHESIA/SEDATION: Moderate (conscious) sedation was employed during this procedure. A total of Versed 1 mg and Fentanyl 25 mcg was administered intravenously. Moderate Sedation Time: 62 minutes. The patient's level of consciousness and vital signs were monitored continuously by radiology nursing throughout the procedure under my direct supervision. CONTRAST:  80 cc Omnipaque 300 FLUOROSCOPY TIME:  19 minutes, 12 seconds (045 mGy) COMPLICATIONS: None immediate. PROCEDURE: Informed consent was obtained from the patient following explanation of the procedure, risks, benefits and alternatives. All questions were addressed. A time out was performed prior to the initiation of the procedure. Maximal barrier sterile technique utilized including caps, mask, sterile gowns, sterile gloves, large sterile drape, hand hygiene, and Betadine prep. The right femoral head was marked fluoroscopically. Under sterile conditions and local anesthesia, the right common femoral artery access was performed with a micropuncture needle. Under direct ultrasound guidance, the right common femoral was accessed with a micropuncture kit. An ultrasound image was saved for documentation purposes.  This allowed for placement of a 5-French vascular sheath. A limited arteriogram was performed through the side arm of the sheath confirming appropriate access within the right common femoral artery. Over a Bentson wire, a Mickelson catheter was advanced the caudal aspect of the thoracic aorta where was reformed, back bled and flushed. The Mickelson catheter was then utilized to select the celiac artery and a celiac arteriogram was performed. Next, with the use of a fathom 14 microwire, a regular Renegade microcatheter was advanced to the level of gastroduodenal artery and a selective gastroduodenal arteriogram was performed The microcatheter was advanced beyond the placed endoscopic clips and the gastroduodenal artery was percutaneously coil embolized with multiple 3 mm, 4 mm, 5 mm, 6 mm and 10 mm interlock coils to near the vessel's origin. Multiple sub selective injections were performed during the percutaneous coil embolization Next, the microcatheter was retracted to the level of the common hepatic artery and a selective common hepatic arteriogram was performed. Next,  the microcatheter was removed and a completion celiac arteriogram was performed to the [facility] catheter. The Mickelson catheter was then utilized to select the superior mesenteric artery and a selective superior mesenteric arteriogram was performed. Images were reviewed and the procedure was terminated. All wires, catheters and sheaths were removed from the patient. Hemostasis was achieved at the right groin access site with deployment of an ExoSeal closure device. The patient tolerated the procedure well without immediate post procedural complication. FINDINGS: Selective celiac arteriogram demonstrates a conventional branching pattern with conventional takeoff of the GDA which terminates in a right gastroepiploic artery. Selective gastroduodenal arteriogram confirms this finding and demonstrates several tiny pancreaticoduodenal branches  contributing arterial supply to region adjacent to the endoscopy clips. A discrete area of contrast extravasation or vessel irregularity is not identified, however the decision was made to proceed with prophylactic embolization of the GDA which was performed with multiple overlapping coils to near the vessel's origin. Following percutaneous embolization, there is complete occlusion of GDA. Selective superior mesenteric arteriogram is negative for definitive retrograde supply to endoscopy clip. IMPRESSION: Technically successful percutaneous prophylactic coil embolization of the GDA for bleeding duodenal ulcer at the location of the endoscopy clip. PLAN: - The patient is to remain flat for 4 hours with right leg straight. - Continued resuscitative interventions is advised as the patient may continue to experience several additional bloody bowel movements, however ultimately I am hopeful he will stabilize in the coming days. - Repeat endoscopy may be performed at discretion of the GI service as Electronically Signed   By: Sandi Mariscal M.D.   On: 10/19/2020 16:25   IR US Guide Vasc Access Right  Result Date: 10/19/2020 INDICATION: Bleeding duodenal ulcer. Please perform mesenteric arteriogram and percutaneous embolization as indicated. EXAM: 1. ULTRASOUND GUIDANCE FOR ARTERIAL ACCESS 2. SELECTIVE SUPERIOR MESENTERIC ARTERIOGRAM 3. SELECTIVE CELIAC ARTERIOGRAM 4. SUB SELECTIVE COMMON HEPATIC ARTERIOGRAM 5. SUB SELECTIVE GASTRODUODENAL ARTERIOGRAM AND PERCUTANEOUS COIL EMBOLIZATION COMPARISON:  CT the chest, abdomen and pelvis-04/30/2020; 04/20/2020 MEDICATIONS: None ANESTHESIA/SEDATION: Moderate (conscious) sedation was employed during this procedure. A total of Versed 1 mg and Fentanyl 25 mcg was administered intravenously. Moderate Sedation Time: 62 minutes. The patient's level of consciousness and vital signs were monitored continuously by radiology nursing throughout the procedure under my direct supervision.  CONTRAST:  80 cc Omnipaque 300 FLUOROSCOPY TIME:  19 minutes, 12 seconds (867 mGy) COMPLICATIONS: None immediate. PROCEDURE: Informed consent was obtained from the patient following explanation of the procedure, risks, benefits and alternatives. All questions were addressed. A time out was performed prior to the initiation of the procedure. Maximal barrier sterile technique utilized including caps, mask, sterile gowns, sterile gloves, large sterile drape, hand hygiene, and Betadine prep. The right femoral head was marked fluoroscopically. Under sterile conditions and local anesthesia, the right common femoral artery access was performed with a micropuncture needle. Under direct ultrasound guidance, the right common femoral was accessed with a micropuncture kit. An ultrasound image was saved for documentation purposes. This allowed for placement of a 5-French vascular sheath. A limited arteriogram was performed through the side arm of the sheath confirming appropriate access within the right common femoral artery. Over a Bentson wire, a Mickelson catheter was advanced the caudal aspect of the thoracic aorta where was reformed, back bled and flushed. The Mickelson catheter was then utilized to select the celiac artery and a celiac arteriogram was performed. Next, with the use of a fathom 14 microwire, a regular Renegade microcatheter was advanced to the level of  gastroduodenal artery and a selective gastroduodenal arteriogram was performed The microcatheter was advanced beyond the placed endoscopic clips and the gastroduodenal artery was percutaneously coil embolized with multiple 3 mm, 4 mm, 5 mm, 6 mm and 10 mm interlock coils to near the vessel's origin. Multiple sub selective injections were performed during the percutaneous coil embolization Next, the microcatheter was retracted to the level of the common hepatic artery and a selective common hepatic arteriogram was performed. Next, the microcatheter was removed  and a completion celiac arteriogram was performed to the [facility] catheter. The Mickelson catheter was then utilized to select the superior mesenteric artery and a selective superior mesenteric arteriogram was performed. Images were reviewed and the procedure was terminated. All wires, catheters and sheaths were removed from the patient. Hemostasis was achieved at the right groin access site with deployment of an ExoSeal closure device. The patient tolerated the procedure well without immediate post procedural complication. FINDINGS: Selective celiac arteriogram demonstrates a conventional branching pattern with conventional takeoff of the GDA which terminates in a right gastroepiploic artery. Selective gastroduodenal arteriogram confirms this finding and demonstrates several tiny pancreaticoduodenal branches contributing arterial supply to region adjacent to the endoscopy clips. A discrete area of contrast extravasation or vessel irregularity is not identified, however the decision was made to proceed with prophylactic embolization of the GDA which was performed with multiple overlapping coils to near the vessel's origin. Following percutaneous embolization, there is complete occlusion of GDA. Selective superior mesenteric arteriogram is negative for definitive retrograde supply to endoscopy clip. IMPRESSION: Technically successful percutaneous prophylactic coil embolization of the GDA for bleeding duodenal ulcer at the location of the endoscopy clip. PLAN: - The patient is to remain flat for 4 hours with right leg straight. - Continued resuscitative interventions is advised as the patient may continue to experience several additional bloody bowel movements, however ultimately I am hopeful he will stabilize in the coming days. - Repeat endoscopy may be performed at discretion of the GI service as Electronically Signed   By: Sandi Mariscal M.D.   On: 10/19/2020 16:25   DG Chest Port 1 View  Result Date:  10/17/2020 CLINICAL DATA:  Weakness, fall EXAM: PORTABLE CHEST 1 VIEW COMPARISON:  06/08/2020 FINDINGS: Low lung volumes. No consolidation or edema. No pleural effusion or pneumothorax. Stable cardiomediastinal contours. Post TAVR. Left chest wall dual lead pacemaker. IMPRESSION: No acute process in the chest. Electronically Signed   By: Macy Mis M.D.   On: 10/17/2020 08:38   CUP PACEART REMOTE DEVICE CHECK  Result Date: 10/13/2020 Scheduled remote reviewed. Normal device function.  Permanent AF. +OAC per prior report. VRC per histogram.  Next remote 91 days.  IR EMBO ART  VEN HEMORR LYMPH EXTRAV  INC GUIDE ROADMAPPING  Result Date: 10/19/2020 INDICATION: Bleeding duodenal ulcer. Please perform mesenteric arteriogram and percutaneous embolization as indicated. EXAM: 1. ULTRASOUND GUIDANCE FOR ARTERIAL ACCESS 2. SELECTIVE SUPERIOR MESENTERIC ARTERIOGRAM 3. SELECTIVE CELIAC ARTERIOGRAM 4. SUB SELECTIVE COMMON HEPATIC ARTERIOGRAM 5. SUB SELECTIVE GASTRODUODENAL ARTERIOGRAM AND PERCUTANEOUS COIL EMBOLIZATION COMPARISON:  CT the chest, abdomen and pelvis-04/30/2020; 04/20/2020 MEDICATIONS: None ANESTHESIA/SEDATION: Moderate (conscious) sedation was employed during this procedure. A total of Versed 1 mg and Fentanyl 25 mcg was administered intravenously. Moderate Sedation Time: 62 minutes. The patient's level of consciousness and vital signs were monitored continuously by radiology nursing throughout the procedure under my direct supervision. CONTRAST:  80 cc Omnipaque 300 FLUOROSCOPY TIME:  19 minutes, 12 seconds (785 mGy) COMPLICATIONS: None immediate. PROCEDURE: Informed consent was  obtained from the patient following explanation of the procedure, risks, benefits and alternatives. All questions were addressed. A time out was performed prior to the initiation of the procedure. Maximal barrier sterile technique utilized including caps, mask, sterile gowns, sterile gloves, large sterile drape, hand hygiene,  and Betadine prep. The right femoral head was marked fluoroscopically. Under sterile conditions and local anesthesia, the right common femoral artery access was performed with a micropuncture needle. Under direct ultrasound guidance, the right common femoral was accessed with a micropuncture kit. An ultrasound image was saved for documentation purposes. This allowed for placement of a 5-French vascular sheath. A limited arteriogram was performed through the side arm of the sheath confirming appropriate access within the right common femoral artery. Over a Bentson wire, a Mickelson catheter was advanced the caudal aspect of the thoracic aorta where was reformed, back bled and flushed. The Mickelson catheter was then utilized to select the celiac artery and a celiac arteriogram was performed. Next, with the use of a fathom 14 microwire, a regular Renegade microcatheter was advanced to the level of gastroduodenal artery and a selective gastroduodenal arteriogram was performed The microcatheter was advanced beyond the placed endoscopic clips and the gastroduodenal artery was percutaneously coil embolized with multiple 3 mm, 4 mm, 5 mm, 6 mm and 10 mm interlock coils to near the vessel's origin. Multiple sub selective injections were performed during the percutaneous coil embolization Next, the microcatheter was retracted to the level of the common hepatic artery and a selective common hepatic arteriogram was performed. Next, the microcatheter was removed and a completion celiac arteriogram was performed to the [facility] catheter. The Mickelson catheter was then utilized to select the superior mesenteric artery and a selective superior mesenteric arteriogram was performed. Images were reviewed and the procedure was terminated. All wires, catheters and sheaths were removed from the patient. Hemostasis was achieved at the right groin access site with deployment of an ExoSeal closure device. The patient tolerated the  procedure well without immediate post procedural complication. FINDINGS: Selective celiac arteriogram demonstrates a conventional branching pattern with conventional takeoff of the GDA which terminates in a right gastroepiploic artery. Selective gastroduodenal arteriogram confirms this finding and demonstrates several tiny pancreaticoduodenal branches contributing arterial supply to region adjacent to the endoscopy clips. A discrete area of contrast extravasation or vessel irregularity is not identified, however the decision was made to proceed with prophylactic embolization of the GDA which was performed with multiple overlapping coils to near the vessel's origin. Following percutaneous embolization, there is complete occlusion of GDA. Selective superior mesenteric arteriogram is negative for definitive retrograde supply to endoscopy clip. IMPRESSION: Technically successful percutaneous prophylactic coil embolization of the GDA for bleeding duodenal ulcer at the location of the endoscopy clip. PLAN: - The patient is to remain flat for 4 hours with right leg straight. - Continued resuscitative interventions is advised as the patient may continue to experience several additional bloody bowel movements, however ultimately I am hopeful he will stabilize in the coming days. - Repeat endoscopy may be performed at discretion of the GI service as Electronically Signed   By: Sandi Mariscal M.D.   On: 10/19/2020 16:25    Micro Results   Recent Results (from the past 240 hour(s))  Resp Panel by RT-PCR (Flu A&B, Covid) Nasopharyngeal Swab     Status: None   Collection Time: 10/17/20  8:38 AM   Specimen: Nasopharyngeal Swab; Nasopharyngeal(NP) swabs in vial transport medium  Result Value Ref Range Status   SARS  Coronavirus 2 by RT PCR NEGATIVE NEGATIVE Final    Comment: (NOTE) SARS-CoV-2 target nucleic acids are NOT DETECTED.  The SARS-CoV-2 RNA is generally detectable in upper respiratory specimens during the acute  phase of infection. The lowest concentration of SARS-CoV-2 viral copies this assay can detect is 138 copies/mL. A negative result does not preclude SARS-Cov-2 infection and should not be used as the sole basis for treatment or other patient management decisions. A negative result may occur with  improper specimen collection/handling, submission of specimen other than nasopharyngeal swab, presence of viral mutation(s) within the areas targeted by this assay, and inadequate number of viral copies(<138 copies/mL). A negative result must be combined with clinical observations, patient history, and epidemiological information. The expected result is Negative.  Fact Sheet for Patients:  EntrepreneurPulse.com.au  Fact Sheet for Healthcare Providers:  IncredibleEmployment.be  This test is no t yet approved or cleared by the [place] FDA and  has been authorized for detection and/or diagnosis of SARS-CoV-2 by FDA under an Emergency Use Authorization (EUA). This EUA will remain  in effect (meaning this test can be used) for the duration of the COVID-19 declaration under Section 564(b)(1) of the Act, 21 U.S.C.section 360bbb-3(b)(1), unless the authorization is terminated  or revoked sooner.       Influenza A by PCR NEGATIVE NEGATIVE Final   Influenza B by PCR NEGATIVE NEGATIVE Final    Comment: (NOTE) The Xpert Xpress SARS-CoV-2/FLU/RSV plus assay is intended as an aid in the diagnosis of influenza from Nasopharyngeal swab specimens and should not be used as a sole basis for treatment. Nasal washings and aspirates are unacceptable for Xpert Xpress SARS-CoV-2/FLU/RSV testing.  Fact Sheet for Patients: EntrepreneurPulse.com.au  Fact Sheet for Healthcare Providers: IncredibleEmployment.be  This test is not yet approved or cleared by the [place] FDA and has been authorized for detection and/or diagnosis of  SARS-CoV-2 by FDA under an Emergency Use Authorization (EUA). This EUA will remain in effect (meaning this test can be used) for the duration of the COVID-19 declaration under Section 564(b)(1) of the Act, 21 U.S.C. section 360bbb-3(b)(1), unless the authorization is terminated or revoked.  Performed at [facility], [address] [place], [place] [address]   Surgical pcr screen     Status: None   Collection Time: 10/18/20  5:29 AM   Specimen: Nasal Mucosa; Nasal Swab  Result Value Ref Range Status   MRSA, PCR NEGATIVE NEGATIVE Final   Staphylococcus aureus NEGATIVE NEGATIVE Final    Comment: (NOTE) The Xpert SA Assay (FDA approved for NASAL specimens in patients 79 years of age and older), is one component of a comprehensive surveillance program. It is not intended to diagnose infection nor to guide or monitor treatment. Performed at [facility], [address] [place], [place] [address]        Today   Subjective    Tina Temme today has no new complaints  No fever  Or chills   No Nausea, Vomiting or Diarrhea          Patient has been seen and examined prior to discharge   Objective   Blood pressure (!) 102/43, pulse (!) 102, temperature 98.7 F (37.1 C), temperature source Oral, resp. rate 18, height 6' (1.829 m), weight 90.8 kg, SpO2 99 %.   Intake/Output Summary (Last 24 hours) at 10/22/2020 1145 Last data filed at 10/22/2020 0900 Gross per 24 hour  Intake 480 ml  Output 1050 ml  Net -570 ml  Exam Gen:- Awake Alert, no acute distress  HEENT:- [place].AT, No sclera icterus Neck-Supple Neck,No JVD,.  Lungs-  CTAB , good air movement bilaterally  CV- S1, S2 normal, regular, left-sided pacemaker in situ Abd-  +ve B.Sounds, Abd Soft, No tenderness,    Extremity/Skin:- No  edema,   good pulses, left knee stiffness and tenderness with range of motion improvement Psych-affect is appropriate, oriented x3 Neuro-generalized weakness, no new focal deficits, no  tremors    Data Review   CBC w Diff:  Lab Results  Component Value Date   WBC 7.2 10/22/2020   HGB 8.6 (L) 10/22/2020   HGB 14.6 09/12/2020   HCT 25.9 (L) 10/22/2020   HCT 40.9 09/12/2020   PLT 117 (L) 10/22/2020   PLT 138 (L) 09/12/2020   LYMPHOPCT 12 10/17/2020   MONOPCT 10 10/17/2020   EOSPCT 2 10/17/2020   BASOPCT 1 10/17/2020    CMP:  Lab Results  Component Value Date   NA 131 (L) 10/22/2020   NA 131 (L) 10/22/2020   NA 135 09/12/2020   K 3.5 10/22/2020   K 3.5 10/22/2020   CL 99 10/22/2020   CL 99 10/22/2020   CO2 23 10/22/2020   CO2 24 10/22/2020   BUN 45 (H) 10/22/2020   BUN 45 (H) 10/22/2020   BUN 19 09/12/2020   CREATININE 1.45 (H) 10/22/2020   CREATININE 1.40 (H) 10/22/2020   CREATININE 1.32 (H) 03/07/2016   PROT 6.2 (L) 10/19/2020   PROT 7.3 09/12/2020   ALBUMIN 2.7 (L) 10/22/2020   ALBUMIN 3.7 09/12/2020   BILITOT 1.7 (H) 10/19/2020   BILITOT 2.4 (H) 09/12/2020   ALKPHOS 124 10/19/2020   AST 42 (H) 10/19/2020   ALT 22 10/19/2020  .   Total Discharge time is about 33 minutes  Roxan Hockey M.D on 10/22/2020 at 11:45 AM  Go to [email or web address] -  for contact info  [facility] - Office  [phone]

## 2020-10-22 NOTE — Discharge Instructions (Signed)
1)Avoid ibuprofen/Advil/Aleve/Motrin/Goody Powders/Naproxen/BC powders/Meloxicam/Diclofenac/Indomethacin and other Nonsteroidal anti-inflammatory medications as these will make you more likely to bleed and can cause stomach ulcers, can also cause Kidney problems.   2)STOP Eliquis/Apixaban until after you have repeat upper endoscopy in September 2022 to make sure that your duodenal ulcer is healed  3)You will need repeat CBC blood test on Monday, 10/24/2020----you can take baby aspirin with food starting on Tuesday, 10/25/2020 if your hemoglobin (hgb) on the repeat CBC blood test on Monday is at or greater than 9  4)Follow up with Elvia Collum M, DO --- for repeat CBC and BMP blood test about a week from now around Monday, 10/31/2020  5)Follow-up Gastroenterologist Dr. Hurshel Keys with [facility]--- in about 4 weeks for Re-evaluation and to schedule repeat upper endoscopy for September, 2022 -address: [address], [place],  [address], Phone: [phone]

## 2020-10-22 NOTE — Plan of Care (Signed)
  Problem: Education: Goal: Knowledge of General Education information will improve Description: Including pain rating scale, medication(s)/side effects and non-pharmacologic comfort measures 10/22/2020 1330 by Santa Lighter, RN Outcome: Adequate for Discharge 10/22/2020 0847 by Santa Lighter, RN Outcome: Progressing   Problem: Health Behavior/Discharge Planning: Goal: Ability to manage health-related needs will improve 10/22/2020 1330 by Santa Lighter, RN Outcome: Adequate for Discharge 10/22/2020 0847 by Santa Lighter, RN Outcome: Progressing   Problem: Activity: Goal: Risk for activity intolerance will decrease Outcome: Adequate for Discharge   Problem: Nutrition: Goal: Adequate nutrition will be maintained Outcome: Adequate for Discharge   Problem: Coping: Goal: Level of anxiety will decrease Outcome: Adequate for Discharge   Problem: Elimination: Goal: Will not experience complications related to bowel motility Outcome: Adequate for Discharge Goal: Will not experience complications related to urinary retention Outcome: Adequate for Discharge   Problem: Pain Managment: Goal: General experience of comfort will improve Outcome: Adequate for Discharge   Problem: Safety: Goal: Ability to remain free from injury will improve Outcome: Adequate for Discharge   Problem: Skin Integrity: Goal: Risk for impaired skin integrity will decrease Outcome: Adequate for Discharge

## 2020-10-22 NOTE — Telephone Encounter (Signed)
CBC order placed to be performed on 10/24/2020.  Dena can you call patient to make sure this gets done?  I have ordered it for [place] labs.  Thank you

## 2020-10-24 ENCOUNTER — Telehealth: Payer: Self-pay | Admitting: *Deleted

## 2020-10-24 ENCOUNTER — Telehealth: Payer: Self-pay | Admitting: Gastroenterology

## 2020-10-24 DIAGNOSIS — D62 Acute posthemorrhagic anemia: Secondary | ICD-10-CM | POA: Diagnosis not present

## 2020-10-24 NOTE — Telephone Encounter (Signed)
Spoke to pt. Informed him to have labs drawn 10/24/2020. Pt informed me that he would like them done at [facility] and would go this afternoon.

## 2020-10-24 NOTE — Telephone Encounter (Signed)
Patient needs hospital follow-up with Dr. Jenetta Downer in about 4 weeks. Dx: anemia, duodenal ulcer with visible vessel.

## 2020-10-24 NOTE — Telephone Encounter (Signed)
Lmom for pt to call office back.

## 2020-10-24 NOTE — Telephone Encounter (Signed)
Transition Care Management Follow-up Telephone Call Date of discharge and from where: July 30.22 from [facility] How have you been since you were released from the hospital? Doing good Any questions or concerns? No  Items Reviewed: Did the pt receive and understand the discharge instructions provided? Yes  Medications obtained and verified? Yes  Other? No  Any new allergies since your discharge? No  Dietary orders reviewed? Yes Do you have support at home? Yes   Home Care and Equipment/Supplies: Were home health services ordered? no If so, what is the name of the agency? N/A  Has the agency set up a time to come to the patient's home? not applicable Were any new equipment or medical supplies ordered?  No What is the name of the medical supply agency? N/A Were you able to get the supplies/equipment? not applicable Do you have any questions related to the use of the equipment or supplies? No-N/A  Functional Questionnaire: (I = Independent and D = Dependent) ADLs: I  Bathing/Dressing- I  Meal Prep- I  Eating- I  Maintaining continence- I  Transferring/Ambulation- I  Managing Meds- I  Follow up appointments reviewed:  PCP Hospital f/u appt confirmed? Yes  Scheduled to see Dr Lovena Le on 10/26/20 @ 2:50. [facility] f/u appt confirmed? Yes  Scheduled to see GI in 4 weeks Are transportation arrangements needed? No  If their condition worsens, is the pt aware to call PCP or go to the Emergency Dept.? Yes Was the patient provided with contact information for the PCP's office or ED? Yes Was to pt encouraged to call back with questions or concerns? Yes

## 2020-10-24 NOTE — Addendum Note (Signed)
Addended by: Inda Castle on: 10/24/2020 10:08 AM   Modules accepted: Orders

## 2020-10-25 LAB — CBC
Hematocrit: 27.1 % — ABNORMAL LOW (ref 37.5–51.0)
Hemoglobin: 9.4 g/dL — ABNORMAL LOW (ref 13.0–17.7)
MCH: 31.6 pg (ref 26.6–33.0)
MCHC: 34.7 g/dL (ref 31.5–35.7)
MCV: 91 fL (ref 79–97)
Platelets: 163 10*3/uL (ref 150–450)
RBC: 2.97 x10E6/uL — ABNORMAL LOW (ref 4.14–5.80)
RDW: 14.5 % (ref 11.6–15.4)
WBC: 7.4 10*3/uL (ref 3.4–10.8)

## 2020-10-26 ENCOUNTER — Ambulatory Visit (INDEPENDENT_AMBULATORY_CARE_PROVIDER_SITE_OTHER): Payer: Medicare HMO | Admitting: Family Medicine

## 2020-10-26 ENCOUNTER — Other Ambulatory Visit: Payer: Self-pay

## 2020-10-26 DIAGNOSIS — M10362 Gout due to renal impairment, left knee: Secondary | ICD-10-CM | POA: Diagnosis not present

## 2020-10-26 DIAGNOSIS — I5042 Chronic combined systolic (congestive) and diastolic (congestive) heart failure: Secondary | ICD-10-CM | POA: Diagnosis not present

## 2020-10-26 DIAGNOSIS — Z5329 Procedure and treatment not carried out because of patient's decision for other reasons: Secondary | ICD-10-CM

## 2020-10-26 DIAGNOSIS — N1832 Chronic kidney disease, stage 3b: Secondary | ICD-10-CM | POA: Diagnosis not present

## 2020-10-26 DIAGNOSIS — D631 Anemia in chronic kidney disease: Secondary | ICD-10-CM | POA: Diagnosis not present

## 2020-10-26 DIAGNOSIS — K269 Duodenal ulcer, unspecified as acute or chronic, without hemorrhage or perforation: Secondary | ICD-10-CM | POA: Diagnosis not present

## 2020-10-26 DIAGNOSIS — I951 Orthostatic hypotension: Secondary | ICD-10-CM | POA: Diagnosis not present

## 2020-10-26 DIAGNOSIS — E1122 Type 2 diabetes mellitus with diabetic chronic kidney disease: Secondary | ICD-10-CM | POA: Diagnosis not present

## 2020-10-26 DIAGNOSIS — I4821 Permanent atrial fibrillation: Secondary | ICD-10-CM | POA: Diagnosis not present

## 2020-10-26 DIAGNOSIS — I130 Hypertensive heart and chronic kidney disease with heart failure and stage 1 through stage 4 chronic kidney disease, or unspecified chronic kidney disease: Secondary | ICD-10-CM | POA: Diagnosis not present

## 2020-10-26 NOTE — Progress Notes (Deleted)
Patient ID: John [name], male    DOB: Sep 27, 1945, 75 y.o.   MRN: [id number]   Chief Complaint  Patient presents with   Hospitalization Follow-up    TOC- GI Bleed   Subjective:   Patient stated he is doing good HPI   Medical History John [name] has a past medical history of Atrial flutter ([place]) (12/2010), Chronic kidney disease, stage 3b ([place]), Chronic systolic CHF (congestive heart failure) ([place]), Class 2 severe obesity due to excess calories with serious comorbidity and body mass index (BMI) of 37.0 to 37.9 in adult [facility]) (07/17/2017), Diabetes mellitus, Hematoma, chest wall (05/15/2020), Hyperlipidemia, Hypertension, Mitral regurgitation, NASH (nonalcoholic steatohepatitis), NICM (nonischemic cardiomyopathy) ([place]), Osteoarthritis, Polyarticular gout (05/15/2020), Presence of permanent cardiac pacemaker, PSVT (paroxysmal supraventricular tachycardia) ([place]), S/P TAVR (transcatheter aortic valve replacement) (05/03/2020), and Severe aortic stenosis.   Outpatient Encounter Medications as of 10/26/2020  Medication Sig   ACCU-CHEK AVIVA PLUS test strip TEST BLOOD SUGAR TWICE DAILY BEFORE BREAKFAST AND AT BEDTIME   acetaminophen (TYLENOL) 325 MG tablet Take 2 tablets (650 mg total) by mouth every 4 (four) hours as needed for headache or mild pain.   allopurinol (ZYLOPRIM) 300 MG tablet Take 0.5 tablets (150 mg total) by mouth daily.   aspirin EC 81 MG tablet Take 1 tablet (81 mg total) by mouth daily with breakfast.   benzonatate (TESSALON) 100 MG capsule Take 1 capsule (100 mg total) by mouth 3 (three) times daily as needed for cough.   carvedilol (COREG) 3.125 MG tablet TAKE 1 TABLET IN THE MORNING AND 2 TABLETS IN THE EVENING   colchicine 0.6 MG tablet Take 1 tablet (0.6 mg total) by mouth daily as needed.   Dulaglutide (TRULICITY) 1.5 HB/7.1IR SOPN INJECT 1.5MG (1 PEN) SUBCUTANEOUSLY EVERY WEEK   insulin degludec (TRESIBA FLEXTOUCH) 100 UNIT/ML FlexTouch Pen Inject 20 Units into the skin  daily.   Insulin Pen Needle (BD PEN NEEDLE NANO U/F) 32G X 4 MM MISC 1 each by Does not apply route 4 (four) times daily.   KLOR-CON M20 20 MEQ tablet TAKE 1.5 TABLETS (30 MEQ TOTAL) BY MOUTH 2 (TWO) TIMES DAILY.   loratadine (CLARITIN) 10 MG tablet TAKE 1 TABLET BY MOUTH EVERY DAY   metolazone (ZAROXOLYN) 2.5 MG tablet Take 1 tablet (2.5 mg total) by mouth as directed. For wight of 200 lb and greater   Multiple Vitamins-Minerals (ONE-A-DAY MENS 50+) TABS Take 1 tablet by mouth daily with breakfast.   pantoprazole (PROTONIX) 40 MG tablet Take 1 tablet (40 mg total) by mouth 2 (two) times daily before a meal.   spironolactone (ALDACTONE) 25 MG tablet Take 0.5 tablets (12.5 mg total) by mouth daily.   sucralfate (CARAFATE) 1 g tablet Take 1 tablet (1 g total) by mouth 4 (four) times daily.   torsemide (DEMADEX) 20 MG tablet Take 3 tablets (60 mg total) by mouth 2 (two) times daily.   No facility-administered encounter medications on file as of 10/26/2020.     Review of Systems   Vitals There were no vitals taken for this visit.  Objective:   Physical Exam   Assessment and Plan   There are no diagnoses linked to this encounter.     No follow-ups on file.  Virtual Visit via Telephone Note  I connected with John [name] on 10/26/20 at  2:50 PM EDT by telephone and verified that I am speaking with the correct person using two identifiers.  Location: Patient: office  Provider: home   I discussed  the limitations, risks, security and privacy concerns of performing an evaluation and management service by telephone and the availability of in person appointments. I also discussed with the patient that there may be a patient responsible charge related to this service. The patient expressed understanding and agreed to proceed.   History of Present Illness:    Observations/Objective:   Assessment and Plan:   Follow Up Instructions:    I discussed the assessment and  treatment plan with the patient. The patient was provided an opportunity to ask questions and all were answered. The patient agreed with the plan and demonstrated an understanding of the instructions.   The patient was advised to call back or seek an in-person evaluation if the symptoms worsen or if the condition fails to improve as anticipated.  I provided *** minutes of non-face-to-face time during this encounter.

## 2020-10-27 ENCOUNTER — Other Ambulatory Visit: Payer: Self-pay

## 2020-10-27 ENCOUNTER — Encounter: Payer: Self-pay | Admitting: Family Medicine

## 2020-10-27 ENCOUNTER — Ambulatory Visit (INDEPENDENT_AMBULATORY_CARE_PROVIDER_SITE_OTHER): Payer: Medicare HMO | Admitting: Family Medicine

## 2020-10-27 VITALS — BP 125/65 | HR 70 | Temp 98.1°F | Ht 72.0 in | Wt 196.0 lb

## 2020-10-27 DIAGNOSIS — E1159 Type 2 diabetes mellitus with other circulatory complications: Secondary | ICD-10-CM

## 2020-10-27 DIAGNOSIS — K264 Chronic or unspecified duodenal ulcer with hemorrhage: Secondary | ICD-10-CM

## 2020-10-27 DIAGNOSIS — D649 Anemia, unspecified: Secondary | ICD-10-CM

## 2020-10-27 DIAGNOSIS — I4892 Unspecified atrial flutter: Secondary | ICD-10-CM

## 2020-10-27 DIAGNOSIS — N1832 Chronic kidney disease, stage 3b: Secondary | ICD-10-CM | POA: Diagnosis not present

## 2020-10-27 DIAGNOSIS — H524 Presbyopia: Secondary | ICD-10-CM | POA: Diagnosis not present

## 2020-10-27 DIAGNOSIS — I5040 Unspecified combined systolic (congestive) and diastolic (congestive) heart failure: Secondary | ICD-10-CM | POA: Diagnosis not present

## 2020-10-27 DIAGNOSIS — H5213 Myopia, bilateral: Secondary | ICD-10-CM | POA: Diagnosis not present

## 2020-10-27 DIAGNOSIS — H40013 Open angle with borderline findings, low risk, bilateral: Secondary | ICD-10-CM | POA: Diagnosis not present

## 2020-10-27 NOTE — Progress Notes (Signed)
Patient ID: John [name], male    DOB: 03-10-1946, 75 y.o.   MRN: [id number]   Chief Complaint  Patient presents with   Hospitalization Follow-up   Subjective:    HPI Pt seen for hospital discharge follow up. Admitted- 10/17/20 Discharge - 10/22/20  Admitted for - hyponatremia, weakness, GIB, hyperglycemia, chronic CHF. Anemia   Pt had blood transfusion while in hospital for Acute GI bleeding. Not on eliquis abpixaban until EGD in 9/22. Dx-Duodenal ulcer. Repeat cbc on 10/24/20 and on 10/31/20.  Not seeing any blood or black stools no.  Feeling a little better after dx from hospital.   Pt needing to get appt with Dr. Abbey Chatters in 4 wks.  Medical History Selma has a past medical history of Atrial flutter ([place]) (12/2010), Chronic kidney disease, stage 3b ([place]), Chronic systolic CHF (congestive heart failure) ([place]), Class 2 severe obesity due to excess calories with serious comorbidity and body mass index (BMI) of 37.0 to 37.9 in adult [facility]) (07/17/2017), Diabetes mellitus, Hematoma, chest wall (05/15/2020), Hyperlipidemia, Hypertension, Mitral regurgitation, NASH (nonalcoholic steatohepatitis), NICM (nonischemic cardiomyopathy) ([place]), Osteoarthritis, Polyarticular gout (05/15/2020), Presence of permanent cardiac pacemaker, PSVT (paroxysmal supraventricular tachycardia) ([place]), S/P TAVR (transcatheter aortic valve replacement) (05/03/2020), and Severe aortic stenosis.   Outpatient Encounter Medications as of 10/27/2020  Medication Sig   ACCU-CHEK AVIVA PLUS test strip TEST BLOOD SUGAR TWICE DAILY BEFORE BREAKFAST AND AT BEDTIME   acetaminophen (TYLENOL) 325 MG tablet Take 2 tablets (650 mg total) by mouth every 4 (four) hours as needed for headache or mild pain.   allopurinol (ZYLOPRIM) 300 MG tablet Take 0.5 tablets (150 mg total) by mouth daily.   aspirin EC 81 MG tablet Take 1 tablet (81 mg total) by mouth daily with breakfast.   benzonatate (TESSALON) 100 MG capsule Take 1 capsule  (100 mg total) by mouth 3 (three) times daily as needed for cough. (Patient not taking: Reported on 11/09/2020)   carvedilol (COREG) 3.125 MG tablet TAKE 1 TABLET IN THE MORNING AND 2 TABLETS IN THE EVENING   colchicine 0.6 MG tablet Take 1 tablet (0.6 mg total) by mouth daily as needed.   Dulaglutide (TRULICITY) 1.5 LD/3.5TS SOPN INJECT 1.5MG (1 PEN) SUBCUTANEOUSLY EVERY WEEK   Insulin Pen Needle (BD PEN NEEDLE NANO U/F) 32G X 4 MM MISC 1 each by Does not apply route 4 (four) times daily.   KLOR-CON M20 20 MEQ tablet TAKE 1.5 TABLETS (30 MEQ TOTAL) BY MOUTH 2 (TWO) TIMES DAILY.   loratadine (CLARITIN) 10 MG tablet TAKE 1 TABLET BY MOUTH EVERY DAY   metolazone (ZAROXOLYN) 2.5 MG tablet Take 1 tablet (2.5 mg total) by mouth as directed. For wight of 200 lb and greater   Multiple Vitamins-Minerals (ONE-A-DAY MENS 50+) TABS Take 1 tablet by mouth daily with breakfast.   pantoprazole (PROTONIX) 40 MG tablet Take 1 tablet (40 mg total) by mouth 2 (two) times daily before a meal.   spironolactone (ALDACTONE) 25 MG tablet Take 0.5 tablets (12.5 mg total) by mouth daily.   sucralfate (CARAFATE) 1 g tablet Take 1 tablet (1 g total) by mouth 4 (four) times daily.   [DISCONTINUED] insulin degludec (TRESIBA FLEXTOUCH) 100 UNIT/ML FlexTouch Pen Inject 20 Units into the skin daily.   [DISCONTINUED] torsemide (DEMADEX) 20 MG tablet Take 3 tablets (60 mg total) by mouth 2 (two) times daily.   No facility-administered encounter medications on file as of 10/27/2020.     Review of Systems  Constitutional:  Negative for chills  and fever.  HENT:  Negative for congestion, rhinorrhea and sore throat.   Respiratory:  Negative for cough, shortness of breath and wheezing.   Cardiovascular:  Negative for chest pain and leg swelling.  Gastrointestinal:  Negative for abdominal pain, diarrhea, nausea and vomiting.  Genitourinary:  Negative for dysuria and frequency.  Skin:  Negative for rash.  Neurological:  Negative for  dizziness, weakness and headaches.    Vitals BP 125/65   Pulse 70   Temp 98.1 F (36.7 C)   Ht 6' (1.829 m)   Wt 196 lb (88.9 kg)   SpO2 99%   BMI 26.58 kg/m   Objective:   Physical Exam Vitals and nursing note reviewed.  Constitutional:      General: He is not in acute distress.    Appearance: Normal appearance. He is not ill-appearing.  HENT:     Head: Normocephalic.     Nose: Nose normal. No congestion.     Mouth/Throat:     Mouth: Mucous membranes are moist.     Pharynx: No oropharyngeal exudate.  Eyes:     Extraocular Movements: Extraocular movements intact.     Conjunctiva/sclera: Conjunctivae normal.     Pupils: Pupils are equal, round, and reactive to light.  Cardiovascular:     Rate and Rhythm: Normal rate. Rhythm irregular.     Pulses: Normal pulses.     Heart sounds: Normal heart sounds. No murmur heard. Pulmonary:     Effort: Pulmonary effort is normal.     Breath sounds: Normal breath sounds. No wheezing, rhonchi or rales.  Musculoskeletal:        General: Normal range of motion.     Right lower leg: No edema.     Left lower leg: No edema.  Skin:    General: Skin is warm and dry.     Findings: No rash.  Neurological:     General: No focal deficit present.     Mental Status: He is alert and oriented to person, place, and time.     Cranial Nerves: No cranial nerve deficit.  Psychiatric:        Mood and Affect: Mood normal.        Behavior: Behavior normal.        Thought Content: Thought content normal.        Judgment: Judgment normal.    Assessment and Plan   1. Gastrointestinal hemorrhage associated with duodenal ulcer - Ambulatory referral to Gastroenterology - CBC - Basic metabolic panel  2. Stage 3b chronic kidney disease ([place])  3. Anemia, unspecified type  4. Atrial flutter, unspecified type ([place])  5. Combined systolic and diastolic congestive heart failure, unspecified HF chronicity ([place])  6. DM type 2 causing vascular disease  ([place])   Acute GIB- stable. Pt stable.  Feeling better after discharge.  No further bleeding he has noticed.  Follow up with Dr. Abbey Chatters with GI -call his office for appt for follow up early sept to get a repeat EGD per discharge summary from hospital.  Anemia s/p transfusion- hb 9.4 on 10/24/20.  Dm2- uncontrolled.  Seeing endo, Dr. Dorris Fetch. Pt to cont to monitor glucose and dec carbs in the diet.  Ckd stage 3- stable. Cont to monitor. Cr 1.45.  Return in about 4 weeks (around 11/24/2020), or if symptoms worsen or fail to improve, for f/u UGIB.

## 2020-10-27 NOTE — Patient Instructions (Addendum)
Follow-up Gastroenterologist Dr. Hurshel Keys with [facility]--- in about 4 weeks for Re-evaluation and to schedule repeat upper endoscopy for September, 2022   -address:    [address], [place], [place] [address], Phone: [phone]

## 2020-10-28 ENCOUNTER — Telehealth: Payer: Self-pay | Admitting: Family Medicine

## 2020-10-28 DIAGNOSIS — D631 Anemia in chronic kidney disease: Secondary | ICD-10-CM | POA: Diagnosis not present

## 2020-10-28 DIAGNOSIS — I951 Orthostatic hypotension: Secondary | ICD-10-CM | POA: Diagnosis not present

## 2020-10-28 DIAGNOSIS — N1832 Chronic kidney disease, stage 3b: Secondary | ICD-10-CM | POA: Diagnosis not present

## 2020-10-28 DIAGNOSIS — E1122 Type 2 diabetes mellitus with diabetic chronic kidney disease: Secondary | ICD-10-CM | POA: Diagnosis not present

## 2020-10-28 DIAGNOSIS — M10362 Gout due to renal impairment, left knee: Secondary | ICD-10-CM | POA: Diagnosis not present

## 2020-10-28 DIAGNOSIS — K269 Duodenal ulcer, unspecified as acute or chronic, without hemorrhage or perforation: Secondary | ICD-10-CM | POA: Diagnosis not present

## 2020-10-28 DIAGNOSIS — I5042 Chronic combined systolic (congestive) and diastolic (congestive) heart failure: Secondary | ICD-10-CM | POA: Diagnosis not present

## 2020-10-28 DIAGNOSIS — I4821 Permanent atrial fibrillation: Secondary | ICD-10-CM | POA: Diagnosis not present

## 2020-10-28 DIAGNOSIS — I130 Hypertensive heart and chronic kidney disease with heart failure and stage 1 through stage 4 chronic kidney disease, or unspecified chronic kidney disease: Secondary | ICD-10-CM | POA: Diagnosis not present

## 2020-10-28 NOTE — Telephone Encounter (Signed)
John [name] with [name] calling to make sure it is OK with PCP to provide PT to patient to help with strengthen and get pt more mobile. Pt was recently discharged from hospital and was ordered upon discharge. Please advise. Thank you   [phone]

## 2020-10-28 NOTE — Telephone Encounter (Signed)
Ben contacted and given verbal OK for PT orders.

## 2020-10-29 ENCOUNTER — Other Ambulatory Visit (HOSPITAL_COMMUNITY): Payer: Self-pay | Admitting: Internal Medicine

## 2020-10-31 DIAGNOSIS — K264 Chronic or unspecified duodenal ulcer with hemorrhage: Secondary | ICD-10-CM | POA: Diagnosis not present

## 2020-10-31 DIAGNOSIS — I130 Hypertensive heart and chronic kidney disease with heart failure and stage 1 through stage 4 chronic kidney disease, or unspecified chronic kidney disease: Secondary | ICD-10-CM | POA: Diagnosis not present

## 2020-10-31 DIAGNOSIS — N1832 Chronic kidney disease, stage 3b: Secondary | ICD-10-CM | POA: Diagnosis not present

## 2020-10-31 DIAGNOSIS — D631 Anemia in chronic kidney disease: Secondary | ICD-10-CM | POA: Diagnosis not present

## 2020-10-31 DIAGNOSIS — K269 Duodenal ulcer, unspecified as acute or chronic, without hemorrhage or perforation: Secondary | ICD-10-CM | POA: Diagnosis not present

## 2020-10-31 DIAGNOSIS — E1122 Type 2 diabetes mellitus with diabetic chronic kidney disease: Secondary | ICD-10-CM | POA: Diagnosis not present

## 2020-10-31 DIAGNOSIS — M10362 Gout due to renal impairment, left knee: Secondary | ICD-10-CM | POA: Diagnosis not present

## 2020-10-31 DIAGNOSIS — I5042 Chronic combined systolic (congestive) and diastolic (congestive) heart failure: Secondary | ICD-10-CM | POA: Diagnosis not present

## 2020-10-31 DIAGNOSIS — I951 Orthostatic hypotension: Secondary | ICD-10-CM | POA: Diagnosis not present

## 2020-10-31 DIAGNOSIS — I4821 Permanent atrial fibrillation: Secondary | ICD-10-CM | POA: Diagnosis not present

## 2020-11-01 ENCOUNTER — Ambulatory Visit (INDEPENDENT_AMBULATORY_CARE_PROVIDER_SITE_OTHER): Payer: Medicare HMO | Admitting: "Endocrinology

## 2020-11-01 ENCOUNTER — Encounter: Payer: Self-pay | Admitting: "Endocrinology

## 2020-11-01 ENCOUNTER — Other Ambulatory Visit: Payer: Self-pay

## 2020-11-01 VITALS — BP 100/52 | HR 80 | Ht 72.0 in | Wt 202.6 lb

## 2020-11-01 DIAGNOSIS — E1159 Type 2 diabetes mellitus with other circulatory complications: Secondary | ICD-10-CM

## 2020-11-01 DIAGNOSIS — E782 Mixed hyperlipidemia: Secondary | ICD-10-CM | POA: Diagnosis not present

## 2020-11-01 DIAGNOSIS — I10 Essential (primary) hypertension: Secondary | ICD-10-CM | POA: Diagnosis not present

## 2020-11-01 LAB — BASIC METABOLIC PANEL
BUN/Creatinine Ratio: 21 (ref 10–24)
BUN: 31 mg/dL — ABNORMAL HIGH (ref 8–27)
CO2: 24 mmol/L (ref 20–29)
Calcium: 9.5 mg/dL (ref 8.6–10.2)
Chloride: 92 mmol/L — ABNORMAL LOW (ref 96–106)
Creatinine, Ser: 1.45 mg/dL — ABNORMAL HIGH (ref 0.76–1.27)
Glucose: 354 mg/dL — ABNORMAL HIGH (ref 65–99)
Potassium: 4.7 mmol/L (ref 3.5–5.2)
Sodium: 135 mmol/L (ref 134–144)
eGFR: 51 mL/min/{1.73_m2} — ABNORMAL LOW (ref >59)

## 2020-11-01 LAB — CBC
Hematocrit: 28.5 % — ABNORMAL LOW (ref 37.5–51.0)
Hemoglobin: 9.5 g/dL — ABNORMAL LOW (ref 13.0–17.7)
MCH: 30 pg (ref 26.6–33.0)
MCHC: 33.3 g/dL (ref 31.5–35.7)
MCV: 90 fL (ref 79–97)
Platelets: 158 10*3/uL (ref 150–450)
RBC: 3.17 x10E6/uL — ABNORMAL LOW (ref 4.14–5.80)
RDW: 14.2 % (ref 11.6–15.4)
WBC: 7.3 10*3/uL (ref 3.4–10.8)

## 2020-11-01 MED ORDER — TRESIBA FLEXTOUCH 100 UNIT/ML ~~LOC~~ SOPN: 40.0000 [IU] | PEN_INJECTOR | Freq: Every day | SUBCUTANEOUS | 2 refills | Status: DC

## 2020-11-01 NOTE — Patient Instructions (Signed)

## 2020-11-01 NOTE — Progress Notes (Signed)
11/01/2020, 12:47 PM   Endocrinology follow-up note    Subjective:    Patient ID: John [name], male    DOB: 1945/11/19.  John [name] is here to follow-up for the management of his currently uncontrolled type 2 diabetes, hyperlipidemia.    PMD:  Erven Colla, DO.   Past Medical History:  Diagnosis Date   Atrial flutter ([place]) 12/2010   Admitted with symptomatic bradycardia (HR 40s), atrial flutter with slow ventricular response 12/2010 + volume overload; AV nodal agents d/c'd and Pradaxa started; RFA in 01/2011   Chronic kidney disease, stage 3b (HCC)    Chronic systolic CHF (congestive heart failure) (HCC)    Class 2 severe obesity due to excess calories with serious comorbidity and body mass index (BMI) of 37.0 to 37.9 in adult ([place]) 07/17/2017   Diabetes mellitus    non insulin dependant   Hematoma, chest wall 05/15/2020   Hyperlipidemia    Hypertension    Mitral regurgitation    NASH (nonalcoholic steatohepatitis)    NICM (nonischemic cardiomyopathy) (HCC)    Osteoarthritis    Polyarticular gout 05/15/2020   Presence of permanent cardiac pacemaker    PSVT (paroxysmal supraventricular tachycardia) (HCC)    Possibly atrial flutter   S/P TAVR (transcatheter aortic valve replacement) 05/03/2020   s/p TAVR with a 29 mm Edwards S3U via the subclavian approach with Dr. Angelena Form & Dr. Cyndia Bent    Severe aortic stenosis    Past Surgical History:  Procedure Laterality Date   ATRIAL FLUTTER ABLATION N/A 02/07/2011   Procedure: ATRIAL FLUTTER ABLATION;  Surgeon: Evans Lance, MD;  Location: [facility] CATH LAB;  Service: Cardiovascular;  Laterality: N/A;   BIV UPGRADE N/A 06/08/2020   Procedure: BIV PPM UPGRADE;  Surgeon: Evans Lance, MD;  Location: [place] CV LAB;  Service: Cardiovascular;  Laterality: N/A;   CARDIAC ELECTROPHYSIOLOGY STUDY AND ABLATION  02/07/11   CARDIOVERSION  N/A 03/23/2016   Procedure: CARDIOVERSION;  Surgeon: Evans Lance, MD;  Location: [place];  Service: Cardiovascular;  Laterality: N/A;   COLONOSCOPY N/A 04/17/2017   Procedure: COLONOSCOPY;  Surgeon: Rogene Houston, MD;  [facility];  Service: Endoscopy;  Laterality: N/A;  [place] N/A 08/29/2015   Procedure: Pacemaker Implant;  Surgeon: Evans Lance, MD;  Location: [place] CV LAB;  Service: Cardiovascular;  Laterality: N/A;   EP IMPLANTABLE DEVICE N/A 12/07/2015   Procedure: PPM Lead Revision/Repair;  Surgeon: Evans Lance, MD;  Location: [place] CV LAB;  Service: Cardiovascular;  Laterality: N/A;   ESOPHAGOGASTRODUODENOSCOPY (EGD) WITH PROPOFOL N/A 10/18/2020   Procedure: ESOPHAGOGASTRODUODENOSCOPY (EGD) WITH PROPOFOL;  Surgeon: Harvel Quale, MD;  [facility];  Service: Gastroenterology;  Laterality: N/A;   IR ANGIOGRAM SELECTIVE EACH ADDITIONAL VESSEL  10/19/2020   IR ANGIOGRAM SELECTIVE EACH ADDITIONAL VESSEL  10/19/2020   IR ANGIOGRAM VISCERAL SELECTIVE  10/19/2020   IR ANGIOGRAM VISCERAL SELECTIVE  10/19/2020   IR EMBO ART  VEN HEMORR LYMPH EXTRAV  INC GUIDE ROADMAPPING  10/19/2020   IR US GUIDE  VASC ACCESS RIGHT  10/19/2020   KNEE ARTHROSCOPY  2005   left   LUMBAR SPINE SURGERY     "I've had 6 ORs 1972 thru 2004"   POLYPECTOMY  04/17/2017   Procedure: POLYPECTOMY;  Surgeon: Rogene Houston, MD;  [facility];  Service: Endoscopy;;  transverse colon x3;   RIGHT HEART CATH N/A 04/28/2020   Procedure: RIGHT HEART CATH;  Surgeon: Jolaine Artist, MD;  Location: [place] CV LAB;  Service: Cardiovascular;  Laterality: N/A;   RIGHT/LEFT HEART CATH AND CORONARY ANGIOGRAPHY N/A 12/21/2019   Procedure: RIGHT/LEFT HEART CATH AND CORONARY ANGIOGRAPHY;  Surgeon: Nelva Bush, MD;  Location: [place] CV LAB;  Service: Cardiovascular;  Laterality: N/A;   TEE WITHOUT CARDIOVERSION N/A 11/05/2019   Procedure:  TRANSESOPHAGEAL ECHOCARDIOGRAM (TEE) WITH PROPOFOL;  Surgeon: Arnoldo Lenis, MD;  [facility];  Service: Endoscopy;  Laterality: N/A;   TEE WITHOUT CARDIOVERSION N/A 05/03/2020   Procedure: TRANSESOPHAGEAL ECHOCARDIOGRAM (TEE);  Surgeon: Burnell Blanks, MD;  Location: [place];  Service: Open Heart Surgery;  Laterality: N/A;   Social History   Socioeconomic History   Marital status: Married    Spouse name: Not on file   Number of children: 2   Years of education: Not on file   Highest education level: Not on file  Occupational History   Occupation: Designer, industrial/product    Employer: RETIRED  Tobacco Use   Smoking status: Former    Packs/day: 1.00    Years: 10.00    Pack years: 10.00    Types: Cigarettes    Quit date: 03/26/1986    Years since quitting: 34.6   Smokeless tobacco: Never   Tobacco comments:    "stopped cigarette  smoking 1988"  Vaping Use   Vaping Use: Never used  Substance and Sexual Activity   Alcohol use: Not Currently    Alcohol/week: 0.0 standard drinks    Comment: "quit alcohol ~ 2007"   Drug use: No   Sexual activity: Yes    Partners: Female  Other Topics Concern   Not on file  Social History Narrative   Not on file   Social Determinants of Health   Financial Resource Strain: Not on file  Food Insecurity: No Food Insecurity   Worried About [place] in the Last Year: Never true   [place] in the Last Year: Never true  Transportation Needs: No Transportation Needs   Lack of Transportation (Medical): No   Lack of Transportation (Non-Medical): No  Physical Activity: Not on file  Stress: Not on file  Social Connections: Not on file   Outpatient Encounter Medications as of 11/01/2020  Medication Sig   ACCU-CHEK AVIVA PLUS test strip TEST BLOOD SUGAR TWICE DAILY BEFORE BREAKFAST AND AT BEDTIME   acetaminophen (TYLENOL) 325 MG tablet Take 2 tablets (650 mg total) by mouth every 4 (four) hours as needed for headache  or mild pain.   allopurinol (ZYLOPRIM) 300 MG tablet Take 0.5 tablets (150 mg total) by mouth daily.   aspirin EC 81 MG tablet Take 1 tablet (81 mg total) by mouth daily with breakfast.   benzonatate (TESSALON) 100 MG capsule Take 1 capsule (100 mg total) by mouth 3 (three) times daily as needed for cough.   carvedilol (COREG) 3.125 MG tablet TAKE 1 TABLET IN THE MORNING AND 2 TABLETS IN THE EVENING   colchicine 0.6 MG tablet Take 1 tablet (0.6 mg total) by mouth daily  as needed.   Dulaglutide (TRULICITY) 1.5 WU/8.8BV SOPN INJECT 1.5MG (1 PEN) SUBCUTANEOUSLY EVERY WEEK   insulin degludec (TRESIBA FLEXTOUCH) 100 UNIT/ML FlexTouch Pen Inject 40 Units into the skin at bedtime.   Insulin Pen Needle (BD PEN NEEDLE NANO U/F) 32G X 4 MM MISC 1 each by Does not apply route 4 (four) times daily.   KLOR-CON M20 20 MEQ tablet TAKE 1.5 TABLETS (30 MEQ TOTAL) BY MOUTH 2 (TWO) TIMES DAILY.   loratadine (CLARITIN) 10 MG tablet TAKE 1 TABLET BY MOUTH EVERY DAY   metolazone (ZAROXOLYN) 2.5 MG tablet Take 1 tablet (2.5 mg total) by mouth as directed. For wight of 200 lb and greater   Multiple Vitamins-Minerals (ONE-A-DAY MENS 50+) TABS Take 1 tablet by mouth daily with breakfast.   pantoprazole (PROTONIX) 40 MG tablet Take 1 tablet (40 mg total) by mouth 2 (two) times daily before a meal.   spironolactone (ALDACTONE) 25 MG tablet Take 0.5 tablets (12.5 mg total) by mouth daily.   sucralfate (CARAFATE) 1 g tablet Take 1 tablet (1 g total) by mouth 4 (four) times daily.   torsemide (DEMADEX) 20 MG tablet TAKE 3 TABLETS (60 MG TOTAL) BY MOUTH 2 (TWO) TIMES DAILY.   [DISCONTINUED] insulin degludec (TRESIBA FLEXTOUCH) 100 UNIT/ML FlexTouch Pen Inject 20 Units into the skin daily.   No facility-administered encounter medications on file as of 11/01/2020.    ALLERGIES: No Known Allergies  VACCINATION STATUS: Immunization History  Administered Date(s) Administered   Influenza,inj,Quad PF,6+ Mos 11/24/2014,  11/29/2015, 12/11/2016, 12/25/2017, 12/20/2018   Influenza-Unspecified 11/24/2013, 12/25/2019   Moderna Sars-Covid-2 Vaccination 05/03/2019, 06/03/2019, 01/23/2020   Pneumococcal Conjugate-13 11/19/2013   Pneumococcal Polysaccharide-23 11/29/2015   Pneumococcal-Unspecified 03/24/2009   Td 06/23/2010    Diabetes He presents for his follow-up (Telephone visit due to coronavirus pandemic) diabetic visit. He has type 2 diabetes mellitus. Onset time: He was diagnosed at approximate age of 53 years. His disease course has been worsening. There are no hypoglycemic associated symptoms. Pertinent negatives for hypoglycemia include no confusion, pallor or seizures. There are no diabetic associated symptoms. Pertinent negatives for diabetes include no fatigue, no polydipsia, no polyphagia, no polyuria and no weakness. There are no hypoglycemic complications. Symptoms are worsening. Diabetic complications include heart disease and nephropathy. Risk factors for coronary artery disease include diabetes mellitus, dyslipidemia, family history, obesity, male sex, hypertension, sedentary lifestyle and tobacco exposure. Current diabetic treatments: He is on Trulicity 1.5 mg Septanest weekly, and Farxiga 10 mg p.o. daily. His weight is increasing steadily. He is following a generally unhealthy diet. When asked about meal planning, he reported none. He has not had a previous visit with a dietitian. He never participates in exercise. His home blood glucose trend is increasing steadily. His breakfast blood glucose range is generally >200 mg/dl. His bedtime blood glucose range is generally >200 mg/dl. His overall blood glucose range is >200 mg/dl. (He presents with worsening glycemic profile averaging 278 for the last 30 days.  His recent A1c was 8.2%.  In the interval, he had GI bleed with anemia which required blood transfusion.  Not document any hypoglycemia recently.   ) An ACE inhibitor/angiotensin II receptor blocker is  being taken. He does not see a podiatrist.Eye exam is current.  Hyperlipidemia This is a chronic problem. The current episode started more than 1 year ago. The problem is controlled. Exacerbating diseases include diabetes and obesity. Pertinent negatives include no myalgias. Current antihyperlipidemic treatment includes statins. Risk factors for coronary artery disease include  diabetes mellitus, dyslipidemia, family history, obesity, male sex, hypertension and a sedentary lifestyle.    Review of systems  Constitutional: + He lost 30+ pounds.  He is recovering from cardiac surgery for aortic stenosis.   Current  Body mass index is 27.48 kg/m. [place] Kitchen    Objective:    Vitals with BMI 11/01/2020 10/27/2020 10/22/2020  Height 6' 0"  6' 0"  -  Weight 202 lbs 10 oz 196 lbs -  BMI 87.56 43.32 -  Systolic 951 884 166  Diastolic 52 65 43  Pulse 80 70 102     Physical Exam- Limited    CMP     Component Value Date/Time   NA 135 10/31/2020 0954   K 4.7 10/31/2020 0954   CL 92 (L) 10/31/2020 0954   CO2 24 10/31/2020 0954   GLUCOSE 354 (H) 10/31/2020 0954   GLUCOSE 193 (H) 10/22/2020 0511   GLUCOSE 193 (H) 10/22/2020 0511   BUN 31 (H) 10/31/2020 0954   CREATININE 1.45 (H) 10/31/2020 0954   CREATININE 1.32 (H) 03/07/2016 1043   CALCIUM 9.5 10/31/2020 0954   PROT 6.2 (L) 10/19/2020 1850   PROT 7.3 09/12/2020 1028   ALBUMIN 2.7 (L) 10/22/2020 0511   ALBUMIN 3.7 09/12/2020 1028   AST 42 (H) 10/19/2020 1850   ALT 22 10/19/2020 1850   ALKPHOS 124 10/19/2020 1850   BILITOT 1.7 (H) 10/19/2020 1850   BILITOT 2.4 (H) 09/12/2020 1028   GFRNONAA 51 (L) 10/22/2020 0511   GFRNONAA 53 (L) 10/22/2020 0511   GFRAA 52 (L) 04/04/2020 0934     Diabetic Labs (most recent): Lab Results  Component Value Date   HGBA1C 8.2 (H) 10/17/2020   HGBA1C 9.9 (A) 08/25/2020   HGBA1C 5.7 (H) 05/03/2020    Lipid Panel     Component Value Date/Time   CHOL 178 08/12/2019 0809   CHOL 142 01/17/2018 0835   TRIG  100 08/12/2019 0809   HDL 51 08/12/2019 0809   HDL 53 01/17/2018 0835   CHOLHDL 3.5 08/12/2019 0809   VLDL 20 08/12/2019 0809   LDLCALC 107 (H) 08/12/2019 0809   LDLCALC 71 01/17/2018 0835     Lab Results  Component Value Date   TSH 1.940 07/10/2017   TSH 1.563 08/25/2015   TSH 1.206 01/05/2011   FREET4 1.12 07/10/2017        Assessment & Plan:   1. DM type 2 causing vascular disease ([place])  - John [name] has currently uncontrolled symptomatic type 2 DM since 74 years of age.  He presents with worsening glycemic profile averaging 278 for the last 30 days.  His recent A1c was 8.2%.  In the interval, he had GI bleed with anemia which required blood transfusion.  Not document any hypoglycemia recently.    -He does not report any major hypoglycemia. Recent labs reviewed.  -his diabetes is complicated by obesity/sedentary life , CKD, CHF , congestive heart failure/bradyarrhythmia and John [name] remains at a high risk for more acute and chronic complications which include CAD, CVA, CKD, retinopathy, and neuropathy. These are all discussed in detail with the patient.  - I encouraged him to switch to  unprocessed or minimally processed complex starch and increased protein intake (animal or plant source), fruits, and vegetables.  - he acknowledges that there is a room for improvement in his food and drink choices. - Suggestion is made for him to avoid simple carbohydrates  from his diet including Cakes, Sweet Desserts, Ice Cream, Soda (diet and  regular), Sweet Tea, Candies, Chips, Cookies, Store Bought Juices, Alcohol in Excess of  1-2 drinks a day, Artificial Sweeteners,  Coffee Creamer, and "Sugar-free" Products, Lemonade. This will help patient to have more stable blood glucose profile and potentially avoid unintended weight gain.   - he is advised to stick to a routine mealtimes to eat 3 meals  a day and avoid unnecessary snacks ( to snack only to correct hypoglycemia).     - I have approached him with the following individualized plan to manage diabetes and patient agrees:   -Based on his presentation with significantly above target glycemic profile, he will need a higher dose of basal insulin before considering prandial insulin.  He is advised to increase his [place] to 40 units nightly along with Trulicity 1.5 mg subcutaneously weekly.   His most recent A1c was 8.2%, likely undermined by his blood loss anemia. -He is advised to continue  monitoring of blood glucose at least twice a day-daily before breakfast and at bedtime.  -Patient is encouraged to call clinic for blood glucose levels less than 70 or above 200 mg /dl. -He was advised to discontinue Farxiga by his cardiologist due to rash in the genital area, which has since resolved.   2) Lipids/HPL:   His recent lipid panel showed LDL of 71.  He will continue to benefit from statin treatment, advised to continue Crestor 20 mg p.o. nightly.  His advised on side effects and precautions.    3) hypertension- -His blood pressure is controlled to target at 100/52. -He is advised to continue on his torsemide as needed, carvedilol 25 mg p.o. twice daily.   4) weight management: His BMI is 27.5- a candidate for  some weight loss.  He lost enough weight recently.  He is advised to continue follow-up with his cardiologist.  - I advised patient to maintain close follow up with Erven Colla, DO for primary care needs.     I spent 31 minutes in the care of the patient today including review of labs from [place], Lipids, Thyroid Function, Hematology (current and previous including abstractions from other facilities); face-to-face time discussing  his blood glucose readings/logs, discussing hypoglycemia and hyperglycemia episodes and symptoms, medications doses, his options of short and long term treatment based on the latest standards of care / guidelines;  discussion about incorporating lifestyle medicine;  and  documenting the encounter.    Please refer to Patient Instructions for Blood Glucose Monitoring and Insulin/Medications Dosing Guide"  in media tab for additional information. Please  also refer to " Patient Self Inventory" in the Media  tab for reviewed elements of pertinent patient history.  John [name] participated in the discussions, expressed understanding, and voiced agreement with the above plans.  All questions were answered to his satisfaction. he is encouraged to contact clinic should he have any questions or concerns prior to his return visit.    Follow up plan: - Return in about 3 months (around 02/01/2021) for Bring Meter and Logs- A1c in Office.  Glade Lloyd, MD [facility] [address] [place], [place] [address] Phone: [phone]  Fax: [phone]    11/01/2020, 12:47 PM  This note was partially dictated with voice recognition software. Similar sounding words can be transcribed inadequately or may not  be corrected upon review.

## 2020-11-02 DIAGNOSIS — I951 Orthostatic hypotension: Secondary | ICD-10-CM | POA: Diagnosis not present

## 2020-11-02 DIAGNOSIS — I130 Hypertensive heart and chronic kidney disease with heart failure and stage 1 through stage 4 chronic kidney disease, or unspecified chronic kidney disease: Secondary | ICD-10-CM | POA: Diagnosis not present

## 2020-11-02 DIAGNOSIS — I5042 Chronic combined systolic (congestive) and diastolic (congestive) heart failure: Secondary | ICD-10-CM | POA: Diagnosis not present

## 2020-11-02 DIAGNOSIS — D631 Anemia in chronic kidney disease: Secondary | ICD-10-CM | POA: Diagnosis not present

## 2020-11-02 DIAGNOSIS — K269 Duodenal ulcer, unspecified as acute or chronic, without hemorrhage or perforation: Secondary | ICD-10-CM | POA: Diagnosis not present

## 2020-11-02 DIAGNOSIS — N1832 Chronic kidney disease, stage 3b: Secondary | ICD-10-CM | POA: Diagnosis not present

## 2020-11-02 DIAGNOSIS — M10362 Gout due to renal impairment, left knee: Secondary | ICD-10-CM | POA: Diagnosis not present

## 2020-11-02 DIAGNOSIS — E1122 Type 2 diabetes mellitus with diabetic chronic kidney disease: Secondary | ICD-10-CM | POA: Diagnosis not present

## 2020-11-02 DIAGNOSIS — I4821 Permanent atrial fibrillation: Secondary | ICD-10-CM | POA: Diagnosis not present

## 2020-11-03 DIAGNOSIS — I5042 Chronic combined systolic (congestive) and diastolic (congestive) heart failure: Secondary | ICD-10-CM | POA: Diagnosis not present

## 2020-11-03 DIAGNOSIS — K269 Duodenal ulcer, unspecified as acute or chronic, without hemorrhage or perforation: Secondary | ICD-10-CM | POA: Diagnosis not present

## 2020-11-03 DIAGNOSIS — I951 Orthostatic hypotension: Secondary | ICD-10-CM | POA: Diagnosis not present

## 2020-11-03 DIAGNOSIS — I130 Hypertensive heart and chronic kidney disease with heart failure and stage 1 through stage 4 chronic kidney disease, or unspecified chronic kidney disease: Secondary | ICD-10-CM | POA: Diagnosis not present

## 2020-11-03 DIAGNOSIS — N1832 Chronic kidney disease, stage 3b: Secondary | ICD-10-CM | POA: Diagnosis not present

## 2020-11-03 DIAGNOSIS — D631 Anemia in chronic kidney disease: Secondary | ICD-10-CM | POA: Diagnosis not present

## 2020-11-03 DIAGNOSIS — E1122 Type 2 diabetes mellitus with diabetic chronic kidney disease: Secondary | ICD-10-CM | POA: Diagnosis not present

## 2020-11-03 DIAGNOSIS — M10362 Gout due to renal impairment, left knee: Secondary | ICD-10-CM | POA: Diagnosis not present

## 2020-11-03 DIAGNOSIS — I4821 Permanent atrial fibrillation: Secondary | ICD-10-CM | POA: Diagnosis not present

## 2020-11-07 DIAGNOSIS — N1832 Chronic kidney disease, stage 3b: Secondary | ICD-10-CM | POA: Diagnosis not present

## 2020-11-07 DIAGNOSIS — I4821 Permanent atrial fibrillation: Secondary | ICD-10-CM | POA: Diagnosis not present

## 2020-11-07 DIAGNOSIS — I5042 Chronic combined systolic (congestive) and diastolic (congestive) heart failure: Secondary | ICD-10-CM | POA: Diagnosis not present

## 2020-11-07 DIAGNOSIS — M10362 Gout due to renal impairment, left knee: Secondary | ICD-10-CM | POA: Diagnosis not present

## 2020-11-07 DIAGNOSIS — D631 Anemia in chronic kidney disease: Secondary | ICD-10-CM | POA: Diagnosis not present

## 2020-11-07 DIAGNOSIS — E1122 Type 2 diabetes mellitus with diabetic chronic kidney disease: Secondary | ICD-10-CM | POA: Diagnosis not present

## 2020-11-07 DIAGNOSIS — I951 Orthostatic hypotension: Secondary | ICD-10-CM | POA: Diagnosis not present

## 2020-11-07 DIAGNOSIS — I130 Hypertensive heart and chronic kidney disease with heart failure and stage 1 through stage 4 chronic kidney disease, or unspecified chronic kidney disease: Secondary | ICD-10-CM | POA: Diagnosis not present

## 2020-11-07 DIAGNOSIS — K269 Duodenal ulcer, unspecified as acute or chronic, without hemorrhage or perforation: Secondary | ICD-10-CM | POA: Diagnosis not present

## 2020-11-07 NOTE — Progress Notes (Signed)
Remote pacemaker transmission.   

## 2020-11-09 ENCOUNTER — Ambulatory Visit (INDEPENDENT_AMBULATORY_CARE_PROVIDER_SITE_OTHER): Payer: Medicare HMO | Admitting: *Deleted

## 2020-11-09 DIAGNOSIS — D631 Anemia in chronic kidney disease: Secondary | ICD-10-CM | POA: Diagnosis not present

## 2020-11-09 DIAGNOSIS — E1159 Type 2 diabetes mellitus with other circulatory complications: Secondary | ICD-10-CM

## 2020-11-09 DIAGNOSIS — I951 Orthostatic hypotension: Secondary | ICD-10-CM | POA: Diagnosis not present

## 2020-11-09 DIAGNOSIS — M10362 Gout due to renal impairment, left knee: Secondary | ICD-10-CM | POA: Diagnosis not present

## 2020-11-09 DIAGNOSIS — I509 Heart failure, unspecified: Secondary | ICD-10-CM | POA: Diagnosis not present

## 2020-11-09 DIAGNOSIS — I5042 Chronic combined systolic (congestive) and diastolic (congestive) heart failure: Secondary | ICD-10-CM | POA: Diagnosis not present

## 2020-11-09 DIAGNOSIS — I130 Hypertensive heart and chronic kidney disease with heart failure and stage 1 through stage 4 chronic kidney disease, or unspecified chronic kidney disease: Secondary | ICD-10-CM | POA: Diagnosis not present

## 2020-11-09 DIAGNOSIS — E1122 Type 2 diabetes mellitus with diabetic chronic kidney disease: Secondary | ICD-10-CM | POA: Diagnosis not present

## 2020-11-09 DIAGNOSIS — K269 Duodenal ulcer, unspecified as acute or chronic, without hemorrhage or perforation: Secondary | ICD-10-CM | POA: Diagnosis not present

## 2020-11-09 DIAGNOSIS — N1832 Chronic kidney disease, stage 3b: Secondary | ICD-10-CM | POA: Diagnosis not present

## 2020-11-09 DIAGNOSIS — I4821 Permanent atrial fibrillation: Secondary | ICD-10-CM | POA: Diagnosis not present

## 2020-11-09 NOTE — Patient Instructions (Signed)
Visit Information  PATIENT GOALS:  Goals Addressed             This Visit's Progress    Monitor and Manage My Blood Sugar-Diabetes Type 2       Timeframe:  Long-Range Goal Priority:  Medium Start Date:        09/21/2020                     Expected End Date:     03/23/2021                  Follow Up Date- 12/21/2020   - check blood sugar at prescribed times and keep a log - check blood sugar if I feel it is too high or too low - take the blood sugar log and meter to all doctor visits - look over information on carbohydrate modified sent via My Chart - be mindful of carbohydrate intake with foods such as bread, pasta, rice, etc that will elevate blood sugar - try to exercise some daily- walking is good - follow up with Dr. Dorris Fetch 11/9   Why is this important?   Checking your blood sugar at home helps to keep it from getting very high or very low.  Writing the results in a diary or log helps the doctor know how to care for you.  Your blood sugar log should have the time, date and the results.  Also, write down the amount of insulin or other medicine that you take.  Other information, like what you ate, exercise done and how you were feeling, will also be helpful.     Notes:      Track and Manage Symptoms-Heart Failure       Timeframe:  Long-Range Goal Priority:  High Start Date:             09/21/2020                Expected End Date:   03/23/2021                    Follow Up Date - 12/21/2020   - continue to follow CHF rescue plan - eat more whole grains, fruits and vegetables, lean meats and healthy fats - follow rescue plan if symptoms flare-up (yellow and red zone) - know when to call the doctor- such as weight gain 3 pounds overnight or 5 pounds in one week, increased swelling. - track symptoms and what helps feel better or worse  - look over low sodium plan sent via My Chart - notify your doctor if have any GI bleeding - follow up with HF clinic 9/19   Why is this  important?   You will be able to handle your symptoms better if you keep track of them.  Making some simple changes to your lifestyle will help.  Eating healthy is one thing you can do to take good care of yourself.    Notes:         Patient verbalizes understanding of instructions provided today and agrees to view in [place].   Telephone follow up appointment with care management team member scheduled for:  9/28/2022Diabetes Mellitus and Nutrition, Adult When you have diabetes, or diabetes mellitus, it is very important to have healthy eating habits because your blood sugar (glucose) levels are greatly affected by what you eat and drink. Eating healthy foods in the right amounts, at about the same times every day, can help you:  Control your blood glucose. Lower your risk of heart disease. Improve your blood pressure. Reach or maintain a healthy weight. What can affect my meal plan? Every person with diabetes is different, and each person has different needs for a meal plan. Your health care provider may recommend that you work with a dietitian to make a meal plan that is best for you. Your meal plan may vary depending on factors such as: The calories you need. The medicines you take. Your weight. Your blood glucose, blood pressure, and cholesterol levels. Your activity level. Other health conditions you have, such as heart or kidney disease. How do carbohydrates affect me? Carbohydrates, also called carbs, affect your blood glucose level more than any other type of food. Eating carbs naturally raises the amount of glucose in your blood. Carb counting is a method for keeping track of how many carbs you eat. Counting carbs is important to keep your blood glucose at a healthy level,especially if you use insulin or take certain oral diabetes medicines. It is important to know how many carbs you can safely have in each meal. This is different for every person. Your dietitian can help you  calculate how manycarbs you should have at each meal and for each snack. How does alcohol affect me? Alcohol can cause a sudden decrease in blood glucose (hypoglycemia), especially if you use insulin or take certain oral diabetes medicines. Hypoglycemia can be a life-threatening condition. Symptoms of hypoglycemia, such as sleepiness, dizziness, and confusion, are similar to symptoms of having too much alcohol. Do not drink alcohol if: Your health care provider tells you not to drink. You are pregnant, may be pregnant, or are planning to become pregnant. If you drink alcohol: Do not drink on an empty stomach. Limit how much you use to: 0-1 drink a day for women. 0-2 drinks a day for men. Be aware of how much alcohol is in your drink. In the [place] one drink equals one 12 oz bottle of beer (355 mL), one 5 oz glass of wine (148 mL), or one 1 oz glass of hard liquor (44 mL). Keep yourself hydrated with water, diet soda, or unsweetened iced tea. Keep in mind that regular soda, juice, and other mixers may contain a lot of sugar and must be counted as carbs. What are tips for following this plan?  Reading food labels Start by checking the serving size on the "Nutrition Facts" label of packaged foods and drinks. The amount of calories, carbs, fats, and other nutrients listed on the label is based on one serving of the item. Many items contain more than one serving per package. Check the total grams (g) of carbs in one serving. You can calculate the number of servings of carbs in one serving by dividing the total carbs by 15. For example, if a food has 30 g of total carbs per serving, it would be equal to 2 servings of carbs. Check the number of grams (g) of saturated fats and trans fats in one serving. Choose foods that have a low amount or none of these fats. Check the number of milligrams (mg) of salt (sodium) in one serving. Most people should limit total sodium intake to less than 2,300 mg per  day. Always check the nutrition information of foods labeled as "low-fat" or "nonfat." These foods may be higher in added sugar or refined carbs and should be avoided. Talk to your dietitian to identify your daily goals for nutrients listed on the label. Shopping  Avoid buying canned, pre-made, or processed foods. These foods tend to be high in fat, sodium, and added sugar. Shop around the outside edge of the grocery store. This is where you will most often find fresh fruits and vegetables, bulk grains, fresh meats, and fresh dairy. Cooking Use low-heat cooking methods, such as baking, instead of high-heat cooking methods like deep frying. Cook using healthy oils, such as olive, canola, or sunflower oil. Avoid cooking with butter, cream, or high-fat meats. Meal planning Eat meals and snacks regularly, preferably at the same times every day. Avoid going long periods of time without eating. Eat foods that are high in fiber, such as fresh fruits, vegetables, beans, and whole grains. Talk with your dietitian about how many servings of carbs you can eat at each meal. Eat 4-6 oz (112-168 g) of lean protein each day, such as lean meat, chicken, fish, eggs, or tofu. One ounce (oz) of lean protein is equal to: 1 oz (28 g) of meat, chicken, or fish. 1 egg.  cup (62 g) of tofu. Eat some foods each day that contain healthy fats, such as avocado, nuts, seeds, and fish. What foods should I eat? Fruits Berries. Apples. Oranges. Peaches. Apricots. Plums. Grapes. Mango. Papaya.Pomegranate. Kiwi. Cherries. Vegetables Lettuce. Spinach. Leafy greens, including kale, chard, collard greens, and mustard greens. Beets. Cauliflower. Cabbage. Broccoli. Carrots. Green beans.Tomatoes. Peppers. Onions. Cucumbers. Brussels sprouts. Grains Whole grains, such as whole-wheat or whole-grain bread, crackers, tortillas,cereal, and pasta. Unsweetened oatmeal. Quinoa. Brown or wild rice. Meats and other proteins Seafood.  Poultry without skin. Lean cuts of poultry and beef. Tofu. Nuts. Seeds. Dairy Low-fat or fat-free dairy products such as milk, yogurt, and cheese. The items listed above may not be a complete list of foods and beverages you can eat. Contact a dietitian for more information. What foods should I avoid? Fruits Fruits canned with syrup. Vegetables Canned vegetables. Frozen vegetables with butter or cream sauce. Grains Refined white flour and flour products such as bread, pasta, snack foods, andcereals. Avoid all processed foods. Meats and other proteins Fatty cuts of meat. Poultry with skin. Breaded or fried meats. Processed meat.Avoid saturated fats. Dairy Full-fat yogurt, cheese, or milk. Beverages Sweetened drinks, such as soda or iced tea. The items listed above may not be a complete list of foods and beverages you should avoid. Contact a dietitian for more information. Questions to ask a health care provider Do I need to meet with a diabetes educator? Do I need to meet with a dietitian? What number can I call if I have questions? When are the best times to check my blood glucose? Where to find more information: American Diabetes Association: diabetes.org Academy of Nutrition and Dietetics: www.eatright.[employer] of Diabetes and Digestive and Kidney Diseases: [place] Association of Diabetes Care and Education Specialists: www.diabeteseducator.org Summary It is important to have healthy eating habits because your blood sugar (glucose) levels are greatly affected by what you eat and drink. A healthy meal plan will help you control your blood glucose and maintain a healthy lifestyle. Your health care provider may recommend that you work with a dietitian to make a meal plan that is best for you. Keep in mind that carbohydrates (carbs) and alcohol have immediate effects on your blood glucose levels. It is important to count carbs and to use alcohol carefully. This  information is not intended to replace advice given to you by your health care provider. Make sure you discuss any questions you have with your healthcare  provider. Document Revised: 02/17/2019 Document Reviewed: 02/17/2019 Elsevier Patient Education  2021 [place]. Low-Sodium Eating Plan Sodium, which is an element that makes up salt, helps you maintain a healthy balance of fluids in your body. Too much sodium can increase your bloodpressure and cause fluid and waste to be held in your body. Your health care provider or dietitian may recommend following this plan if you have high blood pressure (hypertension), kidney disease, liver disease, or heart failure. Eating less sodium can help lower your blood pressure, reduce swelling, and protect your heart, liver, andkidneys. What are tips for following this plan? Reading food labels The Nutrition Facts label lists the amount of sodium in one serving of the food. If you eat more than one serving, you must multiply the listed amount of sodium by the number of servings. Choose foods with less than 140 mg of sodium per serving. Avoid foods with 300 mg of sodium or more per serving. Shopping  Look for lower-sodium products, often labeled as "low-sodium" or "no salt added." Always check the sodium content, even if foods are labeled as "unsalted" or "no salt added." Buy fresh foods. Avoid canned foods and pre-made or frozen meals. Avoid canned, cured, or processed meats. Buy breads that have less than 80 mg of sodium per slice.  Cooking  Eat more home-cooked food and less restaurant, buffet, and fast food. Avoid adding salt when cooking. Use salt-free seasonings or herbs instead of table salt or sea salt. Check with your health care provider or pharmacist before using salt substitutes. Cook with plant-based oils, such as canola, sunflower, or olive oil.  Meal planning When eating at a restaurant, ask that your food be prepared with less salt or  no salt, if possible. Avoid dishes labeled as brined, pickled, cured, smoked, or made with soy sauce, miso, or teriyaki sauce. Avoid foods that contain MSG (monosodium glutamate). MSG is sometimes added to [place] food, bouillon, and some canned foods. Make meals that can be grilled, baked, poached, roasted, or steamed. These are generally made with less sodium. General information Most people on this plan should limit their sodium intake to 1,500-2,000 mg (milligrams) of sodium each day. What foods should I eat? Fruits Fresh, frozen, or canned fruit. Fruit juice. Vegetables Fresh or frozen vegetables. "No salt added" canned vegetables. "No salt added"tomato sauce and paste. Low-sodium or reduced-sodium tomato and vegetable juice. Grains Low-sodium cereals, including oats, puffed wheat and rice, and shredded wheat. Low-sodium crackers. Unsalted rice. Unsalted pasta. Low-sodium bread.Whole-grain breads and whole-grain pasta. Meats and other proteins Fresh or frozen (no salt added) meat, poultry, seafood, and fish. Low-sodium canned tuna and salmon. Unsalted nuts. Dried peas, beans, and lentils withoutadded salt. Unsalted canned beans. Eggs. Unsalted nut butters. Dairy Milk. Soy milk. Cheese that is naturally low in sodium, such as ricotta cheese, fresh mozzarella, or Swiss cheese. Low-sodium or reduced-sodium cheese. Creamcheese. Yogurt. Seasonings and condiments Fresh and dried herbs and spices. Salt-free seasonings. Low-sodium mustard and ketchup. Sodium-free salad dressing. Sodium-free light mayonnaise. Fresh orrefrigerated horseradish. Lemon juice. Vinegar. Other foods Homemade, reduced-sodium, or low-sodium soups. Unsalted popcorn and pretzels.Low-salt or salt-free chips. The items listed above may not be a complete list of foods and beverages you can eat. Contact a dietitian for more information. What foods should I avoid? Vegetables Sauerkraut, pickled vegetables, and relishes. Olives.  [place] fries. Onion rings. Regular canned vegetables (not low-sodium or reduced-sodium). Regular canned tomato sauce and paste (not low-sodium or reduced-sodium). Regular tomato and vegetable juice (not  low-sodium or reduced-sodium). Frozenvegetables in sauces. Grains Instant hot cereals. Bread stuffing, pancake, and biscuit mixes. Croutons. Seasoned rice or pasta mixes. Noodle soup cups. Boxed or frozen macaroni andcheese. Regular salted crackers. Self-rising flour. Meats and other proteins Meat or fish that is salted, canned, smoked, spiced, or pickled. Precooked or cured meat, such as sausages or meat loaves. Berniece Salines. Ham. Pepperoni. Hot dogs. Corned beef. Chipped beef. Salt pork. Jerky. Pickled herring. Anchovies andsardines. Regular canned tuna. Salted nuts. Dairy Processed cheese and cheese spreads. Hard cheeses. Cheese curds. Blue cheese.Feta cheese. String cheese. Regular cottage cheese. Buttermilk. Canned milk. Fats and oils Salted butter. Regular margarine. Ghee. Bacon fat. Seasonings and condiments Onion salt, garlic salt, seasoned salt, table salt, and sea salt. Canned and packaged gravies. Worcestershire sauce. Tartar sauce. Barbecue sauce. Teriyaki sauce. Soy sauce, including reduced-sodium. Steak sauce. Fish sauce. Oyster sauce. Cocktail sauce. Horseradish that you find on the shelf. Regular ketchup and mustard. Meat flavorings and tenderizers. Bouillon cubes. Hot sauce. Pre-made or packaged marinades. Pre-made or packaged taco seasonings. Relishes.Regular salad dressings. Salsa. Other foods Salted popcorn and pretzels. Corn chips and puffs. Potato and tortilla chips.Canned or dried soups. Pizza. Frozen entrees and pot pies. The items listed above may not be a complete list of foods and beverages you should avoid. Contact a dietitian for more information. Summary Eating less sodium can help lower your blood pressure, reduce swelling, and protect your heart, liver, and kidneys. Most  people on this plan should limit their sodium intake to 1,500-2,000 mg (milligrams) of sodium each day. Canned, boxed, and frozen foods are high in sodium. Restaurant foods, fast foods, and pizza are also very high in sodium. You also get sodium by adding salt to food. Try to cook at home, eat more fresh fruits and vegetables, and eat less fast food and canned, processed, or prepared foods. This information is not intended to replace advice given to you by your health care provider. Make sure you discuss any questions you have with your healthcare provider. Document Revised: 04/17/2019 Document Reviewed: 02/11/2019 Elsevier Patient Education  2022 [place]   Jacqlyn Larsen [employer], BSN RN [employer] copywriter Family Medicine [phone]

## 2020-11-09 NOTE — Chronic Care Management (AMB) (Signed)
Chronic Care Management   CCM RN Visit Note  11/09/2020 Name: John [name] MRN: [id number] DOB: [birth year]  Subjective: John [name] is a 75 y.o. year old male who is a primary care patient of Erven Colla, DO. The care management team was consulted for assistance with disease management and care coordination needs.    Engaged with patient by telephone for follow up visit in response to provider referral for case management and/or care coordination services.   Consent to Services:  The patient was given information about Chronic Care Management services, agreed to services, and gave verbal consent prior to initiation of services.  Please see initial visit note for detailed documentation.   Patient agreed to services and verbal consent obtained.   Assessment: Review of patient past medical history, allergies, medications, health status, including review of consultants reports, laboratory and other test data, was performed as part of comprehensive evaluation and provision of chronic care management services.   SDOH (Social Determinants of Health) assessments and interventions performed:    CCM Care Plan  No Known Allergies  Outpatient Encounter Medications as of 11/09/2020  Medication Sig   ACCU-CHEK AVIVA PLUS test strip TEST BLOOD SUGAR TWICE DAILY BEFORE BREAKFAST AND AT BEDTIME   acetaminophen (TYLENOL) 325 MG tablet Take 2 tablets (650 mg total) by mouth every 4 (four) hours as needed for headache or mild pain.   allopurinol (ZYLOPRIM) 300 MG tablet Take 0.5 tablets (150 mg total) by mouth daily.   aspirin EC 81 MG tablet Take 1 tablet (81 mg total) by mouth daily with breakfast.   carvedilol (COREG) 3.125 MG tablet TAKE 1 TABLET IN THE MORNING AND 2 TABLETS IN THE EVENING   colchicine 0.6 MG tablet Take 1 tablet (0.6 mg total) by mouth daily as needed.   Dulaglutide (TRULICITY) 1.5 IF/0.2DX SOPN INJECT 1.5MG (1 PEN) SUBCUTANEOUSLY EVERY WEEK   insulin degludec (TRESIBA  FLEXTOUCH) 100 UNIT/ML FlexTouch Pen Inject 40 Units into the skin at bedtime.   Insulin Pen Needle (BD PEN NEEDLE NANO U/F) 32G X 4 MM MISC 1 each by Does not apply route 4 (four) times daily.   KLOR-CON M20 20 MEQ tablet TAKE 1.5 TABLETS (30 MEQ TOTAL) BY MOUTH 2 (TWO) TIMES DAILY.   loratadine (CLARITIN) 10 MG tablet TAKE 1 TABLET BY MOUTH EVERY DAY   metolazone (ZAROXOLYN) 2.5 MG tablet Take 1 tablet (2.5 mg total) by mouth as directed. For wight of 200 lb and greater   Multiple Vitamins-Minerals (ONE-A-DAY MENS 50+) TABS Take 1 tablet by mouth daily with breakfast.   pantoprazole (PROTONIX) 40 MG tablet Take 1 tablet (40 mg total) by mouth 2 (two) times daily before a meal.   spironolactone (ALDACTONE) 25 MG tablet Take 0.5 tablets (12.5 mg total) by mouth daily.   sucralfate (CARAFATE) 1 g tablet Take 1 tablet (1 g total) by mouth 4 (four) times daily.   torsemide (DEMADEX) 20 MG tablet TAKE 3 TABLETS (60 MG TOTAL) BY MOUTH 2 (TWO) TIMES DAILY.   benzonatate (TESSALON) 100 MG capsule Take 1 capsule (100 mg total) by mouth 3 (three) times daily as needed for cough. (Patient not taking: Reported on 11/09/2020)   No facility-administered encounter medications on file as of 11/09/2020.    Patient Active Problem List   Diagnosis Date Noted   Acute GI bleeding 10/19/2020   GIB (gastrointestinal bleeding) 10/17/2020   Anemia    Hematoma, chest wall 05/15/2020   Polyarticular gout 05/15/2020   S/P TAVR (  transcatheter aortic valve replacement) 05/03/2020   Severe aortic stenosis 04/25/2020   Chronic atrial fibrillation ([place]) 04/25/2020   Acute on chronic systolic CHF (congestive heart failure) ([place]) 04/25/2020   Permanent atrial fibrillation ([place]) 03/10/2020   Calculus of gallbladder without cholecystitis without obstruction    Thrombocytopenia ([place]) 03/09/2020   Hypoalbuminemia 03/09/2020   Transaminitis 03/09/2020   NASH (nonalcoholic steatohepatitis) 03/03/2020   Elevated LFTs  03/03/2020   Lower leg edema 10/14/2019   Class 2 obesity    Hyponatremia 11/11/2018   CKD (chronic kidney disease) stage 3, GFR 30-59 ml/min (HCC)    Paroxysmal atrial fibrillation ([place])    Pacemaker 03/02/2018   Atrial pacemaker lead displacement 12/07/2015   NSVT (nonsustained ventricular tachycardia) ([place]) 08/28/2015   Bradycardia 08/25/2015   Current use of long term anticoagulation 05/04/2011   Atrial flutter ([place]) 01/05/2011   DM type 2 causing vascular disease ([place]) 12/23/2009   Mixed hyperlipidemia 12/23/2009   Essential hypertension, benign 12/23/2009    Conditions to be addressed/monitored:CHF and DMII  Care Plan : Diabetes Type 2 (Adult)  Updates made by Kassie Mends, RN since 11/09/2020 12:00 AM     Problem: Glycemic Management (Diabetes, Type 2)   Priority: Medium     Long-Range Goal: Glycemic Management Optimized   Start Date: 09/21/2020  Expected End Date: 03/23/2021  This Visit's Progress: Not on track  Recent Progress: On track  Priority: Medium  Note:   Objective:  Lab Results  Component Value Date   HGBA1C 9.9 (A) 08/25/2020   Lab Results  Component Value Date   CREATININE 1.24 09/12/2020   CREATININE 1.48 (H) 09/06/2020   CREATININE 1.34 (H) 08/03/2020   Lab Results  Component Value Date   EGFR 61 09/12/2020   Current Barriers:  Knowledge Deficits related to basic Diabetes pathophysiology and self care/management- ADA diet, signs/ symptoms hypo and hyperglycemia Pt reports he checks CBG BID with fasting ranges 120-140's range, random ranges 180-200's. CBG this morning 128. Reports taking all medications as prescribed, is trying to do well with his diet and following up with endocrinologist. Case Manager Clinical Goal(s):  patient will demonstrate improved adherence to prescribed treatment plan for diabetes self care/management as evidenced by: daily monitoring and recording of CBG  adherence to ADA/ carb modified diet adherence to prescribed  medication regimen contacting provider for new or worsened symptoms or questions Interventions:  Collaboration with Erven Colla, DO regarding development and update of comprehensive plan of care as evidenced by provider attestation and co-signature Inter-disciplinary care team collaboration (see longitudinal plan of care) Provided education to patient about basic DM disease process Reviewed medications with patient and discussed importance of medication adherence Reinforced plans with patient for ongoing care management follow up and provided patient with direct contact information for care management team Provided patient with written educational materials via My Chart- Diabetes diet Reviewed scheduled/upcoming provider appointments including:  8/30 Scherrie Gerlach (GI)  9/19 Dr. Haroldine Laws   9/26  Dr. Dorris Fetch   9/28  Dr. Harl Bowie Review of patient status, including review of consultants reports, relevant laboratory and other test results, and medications completed. Reinforced carbohydrate modified diet and foods high in carbohydrates to limit Encouraged pt to get outside everyday and do some type of exercise daily Self-Care Activities - Attends all scheduled provider appointments Patient Goals: - manage portion size - read food labels for fat, fiber, carbohydrates and portion size - check feet daily for cuts, sores or redness - wear comfortable, cotton socks -  wear comfortable, well-fitting shoes - check blood sugar at prescribed times and keep a log - check blood sugar if I feel it is too high or too low - take the blood sugar log and meter to all doctor visits - look over information on carbohydrate modified sent via My Chart - be mindful of carbohydrate intake with foods such as bread, pasta, rice, etc that will elevate blood sugar - try to exercise some daily- walking is good - follow up with Dr. Dorris Fetch 11/9 Follow Up Plan: Telephone follow up appointment with care management team member  scheduled for:   12/21/2020    Care Plan : Heart Failure (Adult)  Updates made by Kassie Mends, RN since 11/09/2020 12:00 AM     Problem: Symptom Exacerbation (Heart Failure)   Priority: High     Long-Range Goal: Symptom Exacerbation Prevented or Minimized   Start Date: 09/21/2020  Expected End Date: 03/23/2021  This Visit's Progress: On track  Recent Progress: On track  Priority: High  Note:   Current Barriers:  Knowledge deficit related to basic heart failure pathophysiology and self care management- needs reinforcement of HF action plan/ symptom management Pt reports he lives with spouse and is independent in all aspects of his care, he does do some walking, does not get out in the community for activities etc.  Has sister in law he can call on also if needed.  Pt reports he weighs daily with weight today 200 pounds, has all medications and taking as prescribed. Hospitalized 7/25-7/30 for weakness, GI bleeding, had endoscopy to locate source of GI bleed, is to have follow up endoscopy in September and will follow up with GI doctor. Patient denies any GI bleeding and continues to be off eliquis. Case Manager Clinical Goal(s):  patient will weigh self daily and record- ongoing patient will verbalize understanding of Heart Failure Action Plan and when to call doctor patient will take all Heart Failure mediations as prescribed patient will weigh daily and record (notifying MD of 3 lb weight gain over night or 5 lb in a week) Interventions:  Collaboration with Erven Colla, DO regarding development and update of comprehensive plan of care as evidenced by provider attestation and co-signature Inter-disciplinary care team collaboration (see longitudinal plan of care) Basic overview and discussion of pathophysiology of Heart Failure reviewed  Reinforced Heart Failure Action Plan with emphasis on yellow zone Reviewed importance of daily weight and advised patient to weigh and record  daily Reviewed role of diuretics in prevention of fluid overload and management of heart failure Reinforced importance of low sodium diet and avoiding salty snacks, fast food Send education via My Chart- low sodium diet Reviewed importance of informing doctor for any GI bleeding Reviewed all upcoming scheduled appointments- 8/30 GI [facility] Carlan  9/19 Dr. Haroldine Laws  9/28  Dr. Harl Bowie  11/9 Dr. Dorris Fetch Self-Care Activities: Attends all scheduled provider appointments Calls provider office for new concerns or questions Patient Goals: - continue to follow CHF rescue plan - eat more whole grains, fruits and vegetables, lean meats and healthy fats - follow rescue plan if symptoms flare-up (yellow and red zone) - know when to call the doctor- such as weight gain 3 pounds overnight or 5 pounds in one week, increased swelling. - track symptoms and what helps feel better or worse  - look over low sodium plan sent via My Chart - notify your doctor if have any GI bleeding - follow up with HF clinic 9/19 Follow Up  Plan: Telephone follow up appointment with care management team member scheduled for:  12/21/2020     Plan:Telephone follow up appointment with care management team member scheduled for:  12/21/2020  Jacqlyn Larsen [facility], BSN RN Case Manager [place] [phone]

## 2020-11-10 DIAGNOSIS — M10362 Gout due to renal impairment, left knee: Secondary | ICD-10-CM | POA: Diagnosis not present

## 2020-11-10 DIAGNOSIS — E1122 Type 2 diabetes mellitus with diabetic chronic kidney disease: Secondary | ICD-10-CM | POA: Diagnosis not present

## 2020-11-10 DIAGNOSIS — I130 Hypertensive heart and chronic kidney disease with heart failure and stage 1 through stage 4 chronic kidney disease, or unspecified chronic kidney disease: Secondary | ICD-10-CM | POA: Diagnosis not present

## 2020-11-10 DIAGNOSIS — N1832 Chronic kidney disease, stage 3b: Secondary | ICD-10-CM | POA: Diagnosis not present

## 2020-11-10 DIAGNOSIS — K269 Duodenal ulcer, unspecified as acute or chronic, without hemorrhage or perforation: Secondary | ICD-10-CM | POA: Diagnosis not present

## 2020-11-10 DIAGNOSIS — I5042 Chronic combined systolic (congestive) and diastolic (congestive) heart failure: Secondary | ICD-10-CM | POA: Diagnosis not present

## 2020-11-10 DIAGNOSIS — I951 Orthostatic hypotension: Secondary | ICD-10-CM | POA: Diagnosis not present

## 2020-11-10 DIAGNOSIS — I4821 Permanent atrial fibrillation: Secondary | ICD-10-CM | POA: Diagnosis not present

## 2020-11-10 DIAGNOSIS — D631 Anemia in chronic kidney disease: Secondary | ICD-10-CM | POA: Diagnosis not present

## 2020-11-10 NOTE — Progress Notes (Signed)
Pt needed to reschedule for in person visit.

## 2020-11-14 DIAGNOSIS — I5042 Chronic combined systolic (congestive) and diastolic (congestive) heart failure: Secondary | ICD-10-CM | POA: Diagnosis not present

## 2020-11-14 DIAGNOSIS — M10362 Gout due to renal impairment, left knee: Secondary | ICD-10-CM | POA: Diagnosis not present

## 2020-11-14 DIAGNOSIS — N1832 Chronic kidney disease, stage 3b: Secondary | ICD-10-CM | POA: Diagnosis not present

## 2020-11-14 DIAGNOSIS — D631 Anemia in chronic kidney disease: Secondary | ICD-10-CM | POA: Diagnosis not present

## 2020-11-14 DIAGNOSIS — I951 Orthostatic hypotension: Secondary | ICD-10-CM | POA: Diagnosis not present

## 2020-11-14 DIAGNOSIS — I130 Hypertensive heart and chronic kidney disease with heart failure and stage 1 through stage 4 chronic kidney disease, or unspecified chronic kidney disease: Secondary | ICD-10-CM | POA: Diagnosis not present

## 2020-11-14 DIAGNOSIS — E1122 Type 2 diabetes mellitus with diabetic chronic kidney disease: Secondary | ICD-10-CM | POA: Diagnosis not present

## 2020-11-14 DIAGNOSIS — I4821 Permanent atrial fibrillation: Secondary | ICD-10-CM | POA: Diagnosis not present

## 2020-11-14 DIAGNOSIS — K269 Duodenal ulcer, unspecified as acute or chronic, without hemorrhage or perforation: Secondary | ICD-10-CM | POA: Diagnosis not present

## 2020-11-17 DIAGNOSIS — I130 Hypertensive heart and chronic kidney disease with heart failure and stage 1 through stage 4 chronic kidney disease, or unspecified chronic kidney disease: Secondary | ICD-10-CM | POA: Diagnosis not present

## 2020-11-17 DIAGNOSIS — E1122 Type 2 diabetes mellitus with diabetic chronic kidney disease: Secondary | ICD-10-CM | POA: Diagnosis not present

## 2020-11-17 DIAGNOSIS — I4821 Permanent atrial fibrillation: Secondary | ICD-10-CM | POA: Diagnosis not present

## 2020-11-17 DIAGNOSIS — M10362 Gout due to renal impairment, left knee: Secondary | ICD-10-CM | POA: Diagnosis not present

## 2020-11-17 DIAGNOSIS — N1832 Chronic kidney disease, stage 3b: Secondary | ICD-10-CM | POA: Diagnosis not present

## 2020-11-17 DIAGNOSIS — I951 Orthostatic hypotension: Secondary | ICD-10-CM | POA: Diagnosis not present

## 2020-11-17 DIAGNOSIS — D631 Anemia in chronic kidney disease: Secondary | ICD-10-CM | POA: Diagnosis not present

## 2020-11-17 DIAGNOSIS — K269 Duodenal ulcer, unspecified as acute or chronic, without hemorrhage or perforation: Secondary | ICD-10-CM | POA: Diagnosis not present

## 2020-11-17 DIAGNOSIS — I5042 Chronic combined systolic (congestive) and diastolic (congestive) heart failure: Secondary | ICD-10-CM | POA: Diagnosis not present

## 2020-11-18 DIAGNOSIS — I130 Hypertensive heart and chronic kidney disease with heart failure and stage 1 through stage 4 chronic kidney disease, or unspecified chronic kidney disease: Secondary | ICD-10-CM | POA: Diagnosis not present

## 2020-11-18 DIAGNOSIS — I5042 Chronic combined systolic (congestive) and diastolic (congestive) heart failure: Secondary | ICD-10-CM | POA: Diagnosis not present

## 2020-11-18 DIAGNOSIS — K269 Duodenal ulcer, unspecified as acute or chronic, without hemorrhage or perforation: Secondary | ICD-10-CM | POA: Diagnosis not present

## 2020-11-18 DIAGNOSIS — M10362 Gout due to renal impairment, left knee: Secondary | ICD-10-CM | POA: Diagnosis not present

## 2020-11-18 DIAGNOSIS — I4821 Permanent atrial fibrillation: Secondary | ICD-10-CM | POA: Diagnosis not present

## 2020-11-18 DIAGNOSIS — N1832 Chronic kidney disease, stage 3b: Secondary | ICD-10-CM | POA: Diagnosis not present

## 2020-11-18 DIAGNOSIS — I951 Orthostatic hypotension: Secondary | ICD-10-CM | POA: Diagnosis not present

## 2020-11-18 DIAGNOSIS — D631 Anemia in chronic kidney disease: Secondary | ICD-10-CM | POA: Diagnosis not present

## 2020-11-18 DIAGNOSIS — E1122 Type 2 diabetes mellitus with diabetic chronic kidney disease: Secondary | ICD-10-CM | POA: Diagnosis not present

## 2020-11-22 ENCOUNTER — Other Ambulatory Visit: Payer: Self-pay

## 2020-11-22 ENCOUNTER — Ambulatory Visit (INDEPENDENT_AMBULATORY_CARE_PROVIDER_SITE_OTHER): Payer: Medicare HMO | Admitting: Gastroenterology

## 2020-11-22 ENCOUNTER — Encounter (INDEPENDENT_AMBULATORY_CARE_PROVIDER_SITE_OTHER): Payer: Self-pay | Admitting: Gastroenterology

## 2020-11-22 ENCOUNTER — Encounter (INDEPENDENT_AMBULATORY_CARE_PROVIDER_SITE_OTHER): Payer: Self-pay

## 2020-11-22 VITALS — BP 111/67 | HR 80 | Temp 97.8°F | Ht 72.0 in | Wt 206.2 lb

## 2020-11-22 DIAGNOSIS — D500 Iron deficiency anemia secondary to blood loss (chronic): Secondary | ICD-10-CM | POA: Diagnosis not present

## 2020-11-22 DIAGNOSIS — K269 Duodenal ulcer, unspecified as acute or chronic, without hemorrhage or perforation: Secondary | ICD-10-CM | POA: Diagnosis not present

## 2020-11-22 DIAGNOSIS — L299 Pruritus, unspecified: Secondary | ICD-10-CM | POA: Diagnosis not present

## 2020-11-22 NOTE — Patient Instructions (Signed)
We will recheck your hemoglobin and your liver function today. Plan for repeat EGD at the beginning of October We will see you again in clinic in December regarding monitoring of your liver. Continue to avoid all NSAIDs other than 83m aspirin with food, and continue pantoprazole twice a day. Please let [place] if you begin having black stools, rectal bleeding, shortness of breath, fatigue or dizziness as these are signs of blood loss that need further evaluation.

## 2020-11-22 NOTE — Progress Notes (Signed)
Referring Provider: Erven Colla, DO Primary Care Physician:  Erven Colla, DO Primary GI Physician:   Chief Complaint  Patient presents with   Follow-up    Patient here today for a follow up. He states he has some issues with constipation and has 1-2 bm's per day. He says appetite is good. Patient is having issues with itching all over body, no rash seen ongoing for the last two months.   HPI:   John [name] is a 75 y.o. male with past medical history of a flutter (chronic eliquis), CKD 3b, CHF, Dm, HLD, HTN, mitral regurgitation, NASH, NICM, gout.   Patient presenting today for follow up of anemia/duodenal ulcer with visible vessel discovered during EGD done during admission for anemia and melena in July 2022. Marland Kitchen  Anemia/duodenal ulcer: patient had recent hospitalization in July 2022 for anemia in setting of melena with hgb as low as 7.3, FOBT positive. Received 2 units PRBCs. On EGD patient was found to have one non-bleeding cratered duodenal ulcer with non bleeding, visible vessel (Forrest Class IIa) in duodenal bulb. Ulcer was injected with epi and clips attempted to be placed, however, this was unsuccessful and patient was transferred to [facility] hospital for GDA embolization in IR.   Patient has been off of eliquis since hospitalization. Taking 76m asa with food currently. Currently on protonix BID since discharge. Last CBC on 10/31/20 with hgb 9.5, appears to be stable around 9 since hospitalization. States he is feeling good, denies any fatigue, dizziness or shortness of breath, no melena or hematochezia. He denies any abdominal pain.   Itching x 3 months, without rash. Using topical cream without much relief. States itching is mostly on his torso. Has tried multiple medications without resolve. History of NASH, most recent LFTs during admission in July 2022: AST 42, ALT 2, ALk Phos 124, T bili 1.7, INR 1.2.   Last Colonoscopy:(2019) 3 polyps in [facility] diverticulosis Last  Endoscopy:(10/18/20) normal esophagus, erythematous mucosa in gastric body, non bleeding duodenal ulcer with a non bleeding visible vessel (forrest Class IIa). Injected, clip placed, no specimens collected.   Recommendations:  Repeat surveillance colonoscopy in 2024 Repeat EGD for reevaluation of duodenal ulcer should be planned for beginning of Oct Past Medical History:  Diagnosis Date   Atrial flutter [place] 12/2010   Admitted with symptomatic bradycardia (HR 40s), atrial flutter with slow ventricular response 12/2010 + volume overload; AV nodal agents d/c'd and Pradaxa started; RFA in 01/2011   Chronic kidney disease, stage 3b (HCC)    Chronic systolic CHF (congestive heart failure) ([place]    Class 2 severe obesity due to excess calories with serious comorbidity and body mass index (BMI) of 37.0 to 37.9 in adult ([place] 07/17/2017   Diabetes mellitus    non insulin dependant   Hematoma, chest wall 05/15/2020   Hyperlipidemia    Hypertension    Mitral regurgitation    NASH (nonalcoholic steatohepatitis)    NICM (nonischemic cardiomyopathy) (HCC)    Osteoarthritis    Polyarticular gout 05/15/2020   Presence of permanent cardiac pacemaker    PSVT (paroxysmal supraventricular tachycardia) (HCC)    Possibly atrial flutter   S/P TAVR (transcatheter aortic valve replacement) 05/03/2020   s/p TAVR with a 29 mm Edwards S3U via the subclavian approach with Dr. MAngelena Form& Dr. BCyndia Bent   Severe aortic stenosis     Past Surgical History:  Procedure Laterality Date   ATRIAL FLUTTER ABLATION N/A 02/07/2011   Procedure: ATRIAL  FLUTTER ABLATION;  Surgeon: Evans Lance, MD;  Location: [facility] CATH LAB;  Service: Cardiovascular;  Laterality: N/A;   BIV UPGRADE N/A 06/08/2020   Procedure: BIV PPM UPGRADE;  Surgeon: Evans Lance, MD;  Location: [place] CV LAB;  Service: Cardiovascular;  Laterality: N/A;   CARDIAC ELECTROPHYSIOLOGY STUDY AND ABLATION  02/07/11   CARDIOVERSION N/A 03/23/2016    Procedure: CARDIOVERSION;  Surgeon: Evans Lance, MD;  Location: [place];  Service: Cardiovascular;  Laterality: N/A;   COLONOSCOPY N/A 04/17/2017   Procedure: COLONOSCOPY;  Surgeon: Rogene Houston, MD;  [facility];  Service: Endoscopy;  Laterality: N/A;  [place] N/A 08/29/2015   Procedure: Pacemaker Implant;  Surgeon: Evans Lance, MD;  Location: [place] CV LAB;  Service: Cardiovascular;  Laterality: N/A;   EP IMPLANTABLE DEVICE N/A 12/07/2015   Procedure: PPM Lead Revision/Repair;  Surgeon: Evans Lance, MD;  Location: [place] CV LAB;  Service: Cardiovascular;  Laterality: N/A;   ESOPHAGOGASTRODUODENOSCOPY (EGD) WITH PROPOFOL N/A 10/18/2020   Procedure: ESOPHAGOGASTRODUODENOSCOPY (EGD) WITH PROPOFOL;  Surgeon: Harvel Quale, MD;  [facility];  Service: Gastroenterology;  Laterality: N/A;   IR ANGIOGRAM SELECTIVE EACH ADDITIONAL VESSEL  10/19/2020   IR ANGIOGRAM SELECTIVE EACH ADDITIONAL VESSEL  10/19/2020   IR ANGIOGRAM VISCERAL SELECTIVE  10/19/2020   IR ANGIOGRAM VISCERAL SELECTIVE  10/19/2020   IR EMBO ART  VEN HEMORR LYMPH EXTRAV  INC GUIDE ROADMAPPING  10/19/2020   IR US GUIDE VASC ACCESS RIGHT  10/19/2020   KNEE ARTHROSCOPY  2005   left   LUMBAR SPINE SURGERY     "I've had 6 ORs 1972 thru 2004"   POLYPECTOMY  04/17/2017   Procedure: POLYPECTOMY;  Surgeon: Rogene Houston, MD;  [facility];  Service: Endoscopy;;  transverse colon x3;   RIGHT HEART CATH N/A 04/28/2020   Procedure: RIGHT HEART CATH;  Surgeon: Jolaine Artist, MD;  Location: [place] CV LAB;  Service: Cardiovascular;  Laterality: N/A;   RIGHT/LEFT HEART CATH AND CORONARY ANGIOGRAPHY N/A 12/21/2019   Procedure: RIGHT/LEFT HEART CATH AND CORONARY ANGIOGRAPHY;  Surgeon: Nelva Bush, MD;  Location: [place] CV LAB;  Service: Cardiovascular;  Laterality: N/A;   TEE WITHOUT CARDIOVERSION N/A 11/05/2019   Procedure: TRANSESOPHAGEAL  ECHOCARDIOGRAM (TEE) WITH PROPOFOL;  Surgeon: Arnoldo Lenis, MD;  [facility];  Service: Endoscopy;  Laterality: N/A;   TEE WITHOUT CARDIOVERSION N/A 05/03/2020   Procedure: TRANSESOPHAGEAL ECHOCARDIOGRAM (TEE);  Surgeon: Burnell Blanks, MD;  Location: [place];  Service: Open Heart Surgery;  Laterality: N/A;    Current Outpatient Medications  Medication Sig Dispense Refill   ACCU-CHEK AVIVA PLUS test strip TEST BLOOD SUGAR TWICE DAILY BEFORE BREAKFAST AND AT BEDTIME 200 strip 1   acetaminophen (TYLENOL) 325 MG tablet Take 2 tablets (650 mg total) by mouth every 4 (four) hours as needed for headache or mild pain.     allopurinol (ZYLOPRIM) 300 MG tablet Take 0.5 tablets (150 mg total) by mouth daily. 15 tablet 6   aspirin EC 81 MG tablet Take 1 tablet (81 mg total) by mouth daily with breakfast. 30 tablet 2   benzonatate (TESSALON) 100 MG capsule Take 1 capsule (100 mg total) by mouth 3 (three) times daily as needed for cough. 90 capsule 11   carvedilol (COREG) 3.125 MG tablet TAKE 1 TABLET IN THE MORNING AND 2 TABLETS IN THE EVENING 270 tablet 6   colchicine  0.6 MG tablet Take 1 tablet (0.6 mg total) by mouth daily as needed. 30 tablet 6   Dulaglutide (TRULICITY) 1.5 HT/3.4KA SOPN INJECT 1.5MG (1 PEN) SUBCUTANEOUSLY EVERY WEEK 6 mL 1   insulin degludec (TRESIBA FLEXTOUCH) 100 UNIT/ML FlexTouch Pen Inject 40 Units into the skin at bedtime. 15 mL 2   Insulin Pen Needle (BD PEN NEEDLE NANO U/F) 32G X 4 MM MISC 1 each by Does not apply route 4 (four) times daily. 150 each 5   KLOR-CON M20 20 MEQ tablet TAKE 1.5 TABLETS (30 MEQ TOTAL) BY MOUTH 2 (TWO) TIMES DAILY. 270 tablet 3   loratadine (CLARITIN) 10 MG tablet TAKE 1 TABLET BY MOUTH EVERY DAY 90 tablet 3   metolazone (ZAROXOLYN) 2.5 MG tablet Take 1 tablet (2.5 mg total) by mouth as directed. For wight of 200 lb and greater 10 tablet 1   Multiple Vitamins-Minerals (ONE-A-DAY MENS 50+) TABS Take 1 tablet by mouth daily with  breakfast.     pantoprazole (PROTONIX) 40 MG tablet Take 1 tablet (40 mg total) by mouth 2 (two) times daily before a meal. 60 tablet 5   spironolactone (ALDACTONE) 25 MG tablet Take 0.5 tablets (12.5 mg total) by mouth daily. 15 tablet 6   sucralfate (CARAFATE) 1 g tablet Take 1 tablet (1 g total) by mouth 4 (four) times daily. (Patient taking differently: Take 1 g by mouth 4 (four) times daily. As needed per patient.) 120 tablet 1   torsemide (DEMADEX) 20 MG tablet TAKE 3 TABLETS (60 MG TOTAL) BY MOUTH 2 (TWO) TIMES DAILY. 540 tablet 1   No current facility-administered medications for this visit.    Allergies as of 11/22/2020   (No Known Allergies)    Family History  Problem Relation Age of Onset   Heart attack Mother    Hypertension Mother    Heart attack Father    Hypertension Father    Heart attack Brother    Colon cancer Neg Hx     Social History   Socioeconomic History   Marital status: Married    Spouse name: Not on file   Number of children: 2   Years of education: Not on file   Highest education level: Not on file  Occupational History   Occupation: Designer, industrial/product    Employer: RETIRED  Tobacco Use   Smoking status: Former    Packs/day: 1.00    Years: 10.00    Pack years: 10.00    Types: Cigarettes    Quit date: 03/26/1986    Years since quitting: 34.6   Smokeless tobacco: Never   Tobacco comments:    "stopped cigarette  smoking 1988"  Vaping Use   Vaping Use: Never used  Substance and Sexual Activity   Alcohol use: Not Currently    Alcohol/week: 0.0 standard drinks    Comment: "quit alcohol ~ 2007"   Drug use: No   Sexual activity: Yes    Partners: Female  Other Topics Concern   Not on file  Social History Narrative   Not on file   Social Determinants of Health   Financial Resource Strain: Not on file  Food Insecurity: No Food Insecurity   Worried About Running Out of Food in the Last Year: Never true   [place] in the Last Year:  Never true  Transportation Needs: No Transportation Needs   Lack of Transportation (Medical): No   Lack of Transportation (Non-Medical): No  Physical Activity: Not on file  Stress: Not  on file  Social Connections: Not on file    Review of Systems: Gen: Denies fever, chills, anorexia. Denies fatigue, weakness, weight loss.  CV: Denies chest pain, palpitations, syncope, peripheral edema, and claudication. Resp: Denies dyspnea at rest, cough, wheezing, coughing up blood, and pleurisy. GI: Denies vomiting blood, jaundice, and fecal incontinence. Denies dysphagia or odynophagia. Denies abdominal swelling Derm: Denies rash,+itching Psych: Denies depression, anxiety, memory loss, confusion. No homicidal or suicidal ideation.  Heme: Denies bruising, bleeding, and enlarged lymph nodes.  Physical Exam: BP 111/67 (BP Location: Left Arm, Patient Position: Sitting, Cuff Size: Large)   Pulse 80   Temp 97.8 F (36.6 C) (Oral)   Ht 6' (1.829 m)   Wt 206 lb 3.2 oz (93.5 kg)   BMI 27.97 kg/m  General:   Alert and oriented. No distress noted. Pleasant and cooperative.  Head:  Normocephalic and atraumatic. Eyes:  Conjuctiva clear without scleral icterus. Mouth:  Oral mucosa pink and moist. Good dentition. No lesions. Heart: Normal rate and rhythm, s1 and s2 heart sounds present.  Lungs: Clear lung sounds in all lobes. Respirations equal and unlabored. Abdomen:  +BS, soft, non-tender and non-distended. No rebound or guarding. No HSM or masses noted. Derm: No palmar erythema or jaundice, no rashes or skin abnormalities noted, color is appropriate Msk:  Symmetrical without gross deformities. Normal posture. Extremities:  Without edema. Neurologic:  Alert and  oriented x4 Psych:  Alert and cooperative. Normal mood and affect.  Invalid input(s): 6 MONTHS   ASSESSMENT: John [name] is a 75 y.o. male presenting today for hospital follow up after admission for anemia secondary to melena with EGD  findings of large, cratered duodenal ulcer at the end of July 2022, s/p GDA embolization in IR. Patient has hx of aflutter and was previously anticoagulated on eliquis, this has been held since hospitalization, currently taking 26m aspirin with food. Protonix 411mBID. Patient doing well, no GI complaints. Denies melena, hematochezia, abdominal pain, early satiety, weight loss, postprandial pain, sob, fatigue, dizziness. We will recheck CBC today to make sure hgb remains stable, last hgb 9.5 on 10/31/20.   Patient also with hx of NASH, he complains of generalized itching to his torso without rash or any other visible skin abnormalities. Most recent LFTs during hospital admission at the end of last month were actually improved from previous LFTs in June and only mildly elevated T bili and AST. I have a very low suspicion that his itching is of hepatic etiology, however, we will recheck LFTs today to rule this out.   Patient aware that he should make [place] if he begins having rectal bleeding, black stools, fatigue, shortness of breath, dizziness, yellowing of the skin or eyes, swelling of his abdomen or confusion.   PLAN:  Repeat EGD at beginning of Oct for reeval of duodenal ulcer Will recheck hgb and LFTs today 3. Continue protonix BID 4 Continue to avoid NSAIDs, other than baby aspirin with food 5. Continue to hold eliquis 6. Reevaluation of NASH in December  Indications, risks and benefits of procedure discussed in detail with patient. Patient verbalized understanding and is in agreement to proceed with EGD at beginning of October  Follow Up: December 2022  Maezie Justin L. CaAlver SorrowMSN, APRN, AGNP-C Adult-Gerontology Nurse Practitioner [facility]

## 2020-11-23 ENCOUNTER — Other Ambulatory Visit: Payer: Self-pay

## 2020-11-23 ENCOUNTER — Other Ambulatory Visit (INDEPENDENT_AMBULATORY_CARE_PROVIDER_SITE_OTHER): Payer: Self-pay

## 2020-11-23 DIAGNOSIS — I951 Orthostatic hypotension: Secondary | ICD-10-CM | POA: Diagnosis not present

## 2020-11-23 DIAGNOSIS — K269 Duodenal ulcer, unspecified as acute or chronic, without hemorrhage or perforation: Secondary | ICD-10-CM

## 2020-11-23 DIAGNOSIS — M10362 Gout due to renal impairment, left knee: Secondary | ICD-10-CM | POA: Diagnosis not present

## 2020-11-23 DIAGNOSIS — E1122 Type 2 diabetes mellitus with diabetic chronic kidney disease: Secondary | ICD-10-CM | POA: Diagnosis not present

## 2020-11-23 DIAGNOSIS — L299 Pruritus, unspecified: Secondary | ICD-10-CM | POA: Diagnosis not present

## 2020-11-23 DIAGNOSIS — D500 Iron deficiency anemia secondary to blood loss (chronic): Secondary | ICD-10-CM | POA: Diagnosis not present

## 2020-11-23 DIAGNOSIS — H40013 Open angle with borderline findings, low risk, bilateral: Secondary | ICD-10-CM | POA: Diagnosis not present

## 2020-11-23 DIAGNOSIS — I130 Hypertensive heart and chronic kidney disease with heart failure and stage 1 through stage 4 chronic kidney disease, or unspecified chronic kidney disease: Secondary | ICD-10-CM | POA: Diagnosis not present

## 2020-11-23 DIAGNOSIS — I4821 Permanent atrial fibrillation: Secondary | ICD-10-CM | POA: Diagnosis not present

## 2020-11-23 DIAGNOSIS — N1832 Chronic kidney disease, stage 3b: Secondary | ICD-10-CM | POA: Diagnosis not present

## 2020-11-23 DIAGNOSIS — I5042 Chronic combined systolic (congestive) and diastolic (congestive) heart failure: Secondary | ICD-10-CM | POA: Diagnosis not present

## 2020-11-23 DIAGNOSIS — D631 Anemia in chronic kidney disease: Secondary | ICD-10-CM | POA: Diagnosis not present

## 2020-11-24 LAB — CBC/DIFF AMBIGUOUS DEFAULT
Basophils Absolute: 0 10*3/uL (ref 0.0–0.2)
Basos: 0 %
EOS (ABSOLUTE): 1 10*3/uL — ABNORMAL HIGH (ref 0.0–0.4)
Eos: 12 %
Hematocrit: 30.8 % — ABNORMAL LOW (ref 37.5–51.0)
Hemoglobin: 10.6 g/dL — ABNORMAL LOW (ref 13.0–17.7)
Immature Grans (Abs): 0 10*3/uL (ref 0.0–0.1)
Immature Granulocytes: 0 %
Lymphocytes Absolute: 1.3 10*3/uL (ref 0.7–3.1)
Lymphs: 16 %
MCH: 28.3 pg (ref 26.6–33.0)
MCHC: 34.4 g/dL (ref 31.5–35.7)
MCV: 82 fL (ref 79–97)
Monocytes Absolute: 0.8 10*3/uL (ref 0.1–0.9)
Monocytes: 11 %
Neutrophils Absolute: 4.8 10*3/uL (ref 1.4–7.0)
Neutrophils: 61 %
Platelets: 169 10*3/uL (ref 150–450)
RBC: 3.74 x10E6/uL — ABNORMAL LOW (ref 4.14–5.80)
RDW: 15.4 % (ref 11.6–15.4)
WBC: 8 10*3/uL (ref 3.4–10.8)

## 2020-11-24 LAB — COMPREHENSIVE METABOLIC PANEL
ALT: 16 IU/L (ref 0–44)
AST: 35 IU/L (ref 0–40)
Albumin/Globulin Ratio: 0.9 — ABNORMAL LOW (ref 1.2–2.2)
Albumin: 3.8 g/dL (ref 3.7–4.7)
Alkaline Phosphatase: 292 IU/L — ABNORMAL HIGH (ref 44–121)
BUN/Creatinine Ratio: 30 — ABNORMAL HIGH (ref 10–24)
BUN: 55 mg/dL — ABNORMAL HIGH (ref 8–27)
Bilirubin Total: 1.4 mg/dL — ABNORMAL HIGH (ref 0.0–1.2)
CO2: 25 mmol/L (ref 20–29)
Calcium: 9.5 mg/dL (ref 8.6–10.2)
Chloride: 98 mmol/L (ref 96–106)
Creatinine, Ser: 1.85 mg/dL — ABNORMAL HIGH (ref 0.76–1.27)
Globulin, Total: 4.3 g/dL (ref 1.5–4.5)
Glucose: 79 mg/dL (ref 65–99)
Potassium: 4.1 mmol/L (ref 3.5–5.2)
Sodium: 139 mmol/L (ref 134–144)
Total Protein: 8.1 g/dL (ref 6.0–8.5)
eGFR: 38 mL/min/{1.73_m2} — ABNORMAL LOW (ref >59)

## 2020-11-24 LAB — SPECIMEN STATUS REPORT

## 2020-11-29 ENCOUNTER — Other Ambulatory Visit (INDEPENDENT_AMBULATORY_CARE_PROVIDER_SITE_OTHER): Payer: Self-pay

## 2020-11-30 DIAGNOSIS — N1832 Chronic kidney disease, stage 3b: Secondary | ICD-10-CM | POA: Diagnosis not present

## 2020-11-30 DIAGNOSIS — I4821 Permanent atrial fibrillation: Secondary | ICD-10-CM | POA: Diagnosis not present

## 2020-11-30 DIAGNOSIS — E1122 Type 2 diabetes mellitus with diabetic chronic kidney disease: Secondary | ICD-10-CM | POA: Diagnosis not present

## 2020-11-30 DIAGNOSIS — K269 Duodenal ulcer, unspecified as acute or chronic, without hemorrhage or perforation: Secondary | ICD-10-CM | POA: Diagnosis not present

## 2020-11-30 DIAGNOSIS — I5042 Chronic combined systolic (congestive) and diastolic (congestive) heart failure: Secondary | ICD-10-CM | POA: Diagnosis not present

## 2020-11-30 DIAGNOSIS — I951 Orthostatic hypotension: Secondary | ICD-10-CM | POA: Diagnosis not present

## 2020-11-30 DIAGNOSIS — D631 Anemia in chronic kidney disease: Secondary | ICD-10-CM | POA: Diagnosis not present

## 2020-11-30 DIAGNOSIS — M10362 Gout due to renal impairment, left knee: Secondary | ICD-10-CM | POA: Diagnosis not present

## 2020-11-30 DIAGNOSIS — I130 Hypertensive heart and chronic kidney disease with heart failure and stage 1 through stage 4 chronic kidney disease, or unspecified chronic kidney disease: Secondary | ICD-10-CM | POA: Diagnosis not present

## 2020-12-07 DIAGNOSIS — I5042 Chronic combined systolic (congestive) and diastolic (congestive) heart failure: Secondary | ICD-10-CM | POA: Diagnosis not present

## 2020-12-07 DIAGNOSIS — N1832 Chronic kidney disease, stage 3b: Secondary | ICD-10-CM | POA: Diagnosis not present

## 2020-12-07 DIAGNOSIS — D631 Anemia in chronic kidney disease: Secondary | ICD-10-CM | POA: Diagnosis not present

## 2020-12-07 DIAGNOSIS — E1122 Type 2 diabetes mellitus with diabetic chronic kidney disease: Secondary | ICD-10-CM | POA: Diagnosis not present

## 2020-12-07 DIAGNOSIS — M10362 Gout due to renal impairment, left knee: Secondary | ICD-10-CM | POA: Diagnosis not present

## 2020-12-07 DIAGNOSIS — I130 Hypertensive heart and chronic kidney disease with heart failure and stage 1 through stage 4 chronic kidney disease, or unspecified chronic kidney disease: Secondary | ICD-10-CM | POA: Diagnosis not present

## 2020-12-07 DIAGNOSIS — I951 Orthostatic hypotension: Secondary | ICD-10-CM | POA: Diagnosis not present

## 2020-12-07 DIAGNOSIS — I4821 Permanent atrial fibrillation: Secondary | ICD-10-CM | POA: Diagnosis not present

## 2020-12-07 DIAGNOSIS — K269 Duodenal ulcer, unspecified as acute or chronic, without hemorrhage or perforation: Secondary | ICD-10-CM | POA: Diagnosis not present

## 2020-12-11 NOTE — Progress Notes (Signed)
ADVANCED HF CLINIC NOTE  Referring Physician: Levell July NP, Cardiology  Primary Care: Erven Colla, DO Primary Cardiologist: Dr. Harl Bowie   Reason for Referral: Chronic Combined Systolic and Diastolic Heart Failure   HPI:  Mr. Mahaney is a 75 y/o male w/systolic HF due to NICM (cath 9/21 no CAD), h/o CHB s/p PPM 2017,  HTN, DM2, atrial flutter, stage 3b CKD (baseline SCr ~1.5-2.0) and severe AS s/p TAVR 2/22.Marland Kitchen   Echo 7/21 EF 30-35%. RV mildly reduced. Mod-sev MR Moderate AS  TEE 8/21  moderate MR and moderate TR. AoV severely thickened/heavily calcified  [facility] 9/21 No CAD. Moderately elevated filling pressures and moderately reduced CO. CI 1.8. Also concern for RV pacing CM with 42% RV pacing  Several admits in 12/21 for ADHF.   Admitted 2/22 for severe HF and TAVR. Started on dobutamine support. Underwent TVRF 05/03/20. Diuresed and DBA weaned off.   Attempted CRT upgrade 3/22. Left subclavian vein occluded  Echo 3/22: EF 25-30% moderate MR. TAVR stable  Admitted 7/22 with weakness found to have anemia and hyponatremia. Hgb 14 -> 7.3  got 2u RBCs. EGD with duodenal ulcer with visible vessel so Eliquis held. Could not adequately clip so underwent IR embolization.  Here with his wife. Says he feels much better over the last month. Able to walk a bit. Does ADLs without problem. No CP. Denies orthopnea or PND. Takes torsemide 60 bid and metolazone 1-2x/month. Swelling under control. Has repeat EGD on 10/6     Past Medical History:  Diagnosis Date   Atrial flutter ([place]) 12/2010   Admitted with symptomatic bradycardia (HR 40s), atrial flutter with slow ventricular response 12/2010 + volume overload; AV nodal agents d/c'd and Pradaxa started; RFA in 01/2011   Chronic kidney disease, stage 3b (HCC)    Chronic systolic CHF (congestive heart failure) (HCC)    Class 2 severe obesity due to excess calories with serious comorbidity and body mass index (BMI) of 37.0 to 37.9 in adult  [facility]) 07/17/2017   Diabetes mellitus    non insulin dependant   Hematoma, chest wall 05/15/2020   Hyperlipidemia    Hypertension    Mitral regurgitation    NASH (nonalcoholic steatohepatitis)    NICM (nonischemic cardiomyopathy) ([place])    Osteoarthritis    Polyarticular gout 05/15/2020   Presence of permanent cardiac pacemaker    PSVT (paroxysmal supraventricular tachycardia) (HCC)    Possibly atrial flutter   S/P TAVR (transcatheter aortic valve replacement) 05/03/2020   s/p TAVR with a 29 mm Edwards S3U via the subclavian approach with Dr. Angelena Form & Dr. Cyndia Bent    Severe aortic stenosis     Current Outpatient Medications  Medication Sig Dispense Refill   ACCU-CHEK AVIVA PLUS test strip TEST BLOOD SUGAR TWICE DAILY BEFORE BREAKFAST AND AT BEDTIME 200 strip 1   acetaminophen (TYLENOL) 325 MG tablet Take 2 tablets (650 mg total) by mouth every 4 (four) hours as needed for headache or mild pain.     allopurinol (ZYLOPRIM) 300 MG tablet Take 0.5 tablets (150 mg total) by mouth daily. (Patient taking differently: Take 150 mg by mouth daily. As needed) 15 tablet 6   aspirin EC 81 MG tablet Take 1 tablet (81 mg total) by mouth daily with breakfast. 30 tablet 2   benzonatate (TESSALON) 100 MG capsule Take 1 capsule (100 mg total) by mouth 3 (three) times daily as needed for cough. 90 capsule 11   carvedilol (COREG) 3.125 MG tablet TAKE 1 TABLET  IN THE MORNING AND 2 TABLETS IN THE EVENING 270 tablet 6   colchicine 0.6 MG tablet Take 1 tablet (0.6 mg total) by mouth daily as needed. 30 tablet 6   Dulaglutide (TRULICITY) 1.5 JF/3.5KT SOPN INJECT 1.5MG (1 PEN) SUBCUTANEOUSLY EVERY WEEK 6 mL 1   insulin degludec (TRESIBA FLEXTOUCH) 100 UNIT/ML FlexTouch Pen Inject 40 Units into the skin at bedtime. 15 mL 2   Insulin Pen Needle (BD PEN NEEDLE NANO U/F) 32G X 4 MM MISC 1 each by Does not apply route 4 (four) times daily. 150 each 5   KLOR-CON M20 20 MEQ tablet TAKE 1.5 TABLETS (30 MEQ TOTAL) BY MOUTH  2 (TWO) TIMES DAILY. 270 tablet 3   loratadine (CLARITIN) 10 MG tablet TAKE 1 TABLET BY MOUTH EVERY DAY 90 tablet 3   metolazone (ZAROXOLYN) 2.5 MG tablet Take 1 tablet (2.5 mg total) by mouth as directed. For wight of 200 lb and greater 10 tablet 1   Multiple Vitamins-Minerals (ONE-A-DAY MENS 50+) TABS Take 1 tablet by mouth daily with breakfast.     pantoprazole (PROTONIX) 40 MG tablet Take 1 tablet (40 mg total) by mouth 2 (two) times daily before a meal. 60 tablet 5   spironolactone (ALDACTONE) 25 MG tablet Take 0.5 tablets (12.5 mg total) by mouth daily. 15 tablet 6   sucralfate (CARAFATE) 1 g tablet Take 1 tablet (1 g total) by mouth 4 (four) times daily. (Patient taking differently: Take 1 g by mouth 4 (four) times daily. As needed per patient.) 120 tablet 1   torsemide (DEMADEX) 20 MG tablet TAKE 3 TABLETS (60 MG TOTAL) BY MOUTH 2 (TWO) TIMES DAILY. 540 tablet 1   No current facility-administered medications for this encounter.    No Known Allergies    Social History   Socioeconomic History   Marital status: Married    Spouse name: Not on file   Number of children: 2   Years of education: Not on file   Highest education level: Not on file  Occupational History   Occupation: Designer, industrial/product    Employer: RETIRED  Tobacco Use   Smoking status: Former    Packs/day: 1.00    Years: 10.00    Pack years: 10.00    Types: Cigarettes    Quit date: 03/26/1986    Years since quitting: 34.7   Smokeless tobacco: Never   Tobacco comments:    "stopped cigarette  smoking 1988"  Vaping Use   Vaping Use: Never used  Substance and Sexual Activity   Alcohol use: Not Currently    Alcohol/week: 0.0 standard drinks    Comment: "quit alcohol ~ 2007"   Drug use: No   Sexual activity: Yes    Partners: Female  Other Topics Concern   Not on file  Social History Narrative   Not on file   Social Determinants of Health   Financial Resource Strain: Not on file  Food Insecurity: No Food  Insecurity   Worried About [place] in the Last Year: Never true   [place] in the Last Year: Never true  Transportation Needs: No Transportation Needs   Lack of Transportation (Medical): No   Lack of Transportation (Non-Medical): No  Physical Activity: Not on file  Stress: Not on file  Social Connections: Not on file  Intimate Partner Violence: Not on file      Family History  Problem Relation Age of Onset   Heart attack Mother    Hypertension  Mother    Heart attack Father    Hypertension Father    Heart attack Brother    Colon cancer Neg Hx     Vitals:   12/12/20 1056  BP: (!) 112/57  Pulse: 85  SpO2: 98%  Weight: 95.5 kg (210 lb 9.6 oz)    Wt Readings from Last 3 Encounters:  12/12/20 95.5 kg (210 lb 9.6 oz)  11/22/20 93.5 kg (206 lb 3.2 oz)  11/01/20 91.9 kg (202 lb 9.6 oz)    PHYSICAL EXAM: General:  Well appearing. No resp difficulty HEENT: normal Neck: supple. JVP 10-12 + prominent v waves  Carotids 2+ bilat; no bruits. No lymphadenopathy or thryomegaly appreciated. Cor: PMI nondisplaced. Irregular rate & rhythm. 2/6 TR Lungs: clear Abdomen: soft, nontender, nondistended. No hepatosplenomegaly. No bruits or masses. Good bowel sounds. Extremities: no cyanosis, clubbing, rash, 1-2+ edema Neuro: alert & orientedx3, cranial nerves grossly intact. moves all 4 extremities w/o difficulty. Affect pleasant    ASSESSMENT & PLAN:  1. Acute on Chronic Combined Systolic and Diastolic Heart Failure/NICM: - Echo 08/2015 EF 50-55% (PPM placed in 08/2015)  - Echo 10/2015 EF dropped to 40-45% - Echo 2018 EF 45% - Echo 2021 EF 30-35%, RV mildly reduced  - Echo 3/22 EF 25-30% TAVR ok. Mod MR .-LHC 9/21 that showed no coronary disease.  - based on timing of drop in EF and 42% pacing percentage, concern for RV paced CM +AS - Left subclavian vein occluded -> unable to upgrade to CRT - s/p TAVR 2/22 - Stable/improved NYHA II-III - Volume status up  -  Continue torsemide 60 bid.Take metolazone 2.5 mg with kcl 40 every Monday.   Labs today and 2 week.  - Continue Farxiga 10 - Continue spiro 12.5 - Continue carvedilol 3.125/6.25 - PPM interrogated in clinic No VT. 100% AF Pacing < 10% so no need for CRT currently.  - Possible candidate for ADI study  2. Mitral Regurgitation  - Moderate by TEE 8/21 and Echo 3/22 - likely functional from dilated CM  - will follow - repeat echo next year  3. Aortic Stenosis  - s/p TAVR 3/22 - stable on echo   4. Atrial Flutter/ Chronic Atrial Fibrillation - s/p AFL ablation  - in chronic Afib, rate controlled  - s/p recent GI bleed so off Eliquis. Repeat EGD 10/6. If looks ok will restart  5. Stage IIIb CKD - followed by nephrology  - baseline SCr ~1.5-1.8,  - Most recent SCr 1.85 - Recheck labs today and 2 weeks   6. T2DM - on insulin - Continue Farxiga  7. H/o CHB  - s/p PPM followed by Dr. Lovena Le  - ? RV paced CM. He is now pacing < 10%. Had drop in EF 2 months after pacer was placed  - following with Dr. Lovena Le. Attempted CRT upgrade 3/22 but left subclavian vein occluded   8. UGIB - EGD in 7/22 with duodenal ulcer with visible vessel. Underwent coiling of GDA  - Due for repeat EGD on 12/29/20 if improved can restart Eliquis   Glori Bickers, MD  11:15 AM

## 2020-12-12 ENCOUNTER — Ambulatory Visit (HOSPITAL_COMMUNITY)
Admission: RE | Admit: 2020-12-12 | Discharge: 2020-12-12 | Disposition: A | Discharge status: Home / Self Care | Payer: Medicare HMO | Source: Ambulatory Visit | Attending: Internal Medicine | Admitting: Internal Medicine

## 2020-12-12 ENCOUNTER — Other Ambulatory Visit: Payer: Self-pay

## 2020-12-12 ENCOUNTER — Encounter (HOSPITAL_COMMUNITY): Payer: Self-pay | Admitting: Internal Medicine

## 2020-12-12 VITALS — BP 112/57 | HR 85 | Wt 210.6 lb

## 2020-12-12 DIAGNOSIS — I428 Other cardiomyopathies: Secondary | ICD-10-CM | POA: Diagnosis not present

## 2020-12-12 DIAGNOSIS — I4819 Other persistent atrial fibrillation: Secondary | ICD-10-CM | POA: Diagnosis not present

## 2020-12-12 DIAGNOSIS — N1832 Chronic kidney disease, stage 3b: Secondary | ICD-10-CM | POA: Diagnosis not present

## 2020-12-12 DIAGNOSIS — E1122 Type 2 diabetes mellitus with diabetic chronic kidney disease: Secondary | ICD-10-CM | POA: Insufficient documentation

## 2020-12-12 DIAGNOSIS — I080 Rheumatic disorders of both mitral and aortic valves: Secondary | ICD-10-CM | POA: Diagnosis not present

## 2020-12-12 DIAGNOSIS — I5022 Chronic systolic (congestive) heart failure: Secondary | ICD-10-CM | POA: Diagnosis not present

## 2020-12-12 DIAGNOSIS — I130 Hypertensive heart and chronic kidney disease with heart failure and stage 1 through stage 4 chronic kidney disease, or unspecified chronic kidney disease: Secondary | ICD-10-CM | POA: Diagnosis not present

## 2020-12-12 DIAGNOSIS — I4892 Unspecified atrial flutter: Secondary | ICD-10-CM | POA: Insufficient documentation

## 2020-12-12 DIAGNOSIS — Z79899 Other long term (current) drug therapy: Secondary | ICD-10-CM | POA: Insufficient documentation

## 2020-12-12 DIAGNOSIS — Z45010 Encounter for checking and testing of cardiac pacemaker pulse generator [battery]: Secondary | ICD-10-CM | POA: Diagnosis not present

## 2020-12-12 DIAGNOSIS — Z7982 Long term (current) use of aspirin: Secondary | ICD-10-CM | POA: Diagnosis not present

## 2020-12-12 DIAGNOSIS — Z7901 Long term (current) use of anticoagulants: Secondary | ICD-10-CM | POA: Insufficient documentation

## 2020-12-12 DIAGNOSIS — I5043 Acute on chronic combined systolic (congestive) and diastolic (congestive) heart failure: Secondary | ICD-10-CM | POA: Diagnosis not present

## 2020-12-12 DIAGNOSIS — Z8249 Family history of ischemic heart disease and other diseases of the circulatory system: Secondary | ICD-10-CM | POA: Diagnosis not present

## 2020-12-12 DIAGNOSIS — Z794 Long term (current) use of insulin: Secondary | ICD-10-CM | POA: Insufficient documentation

## 2020-12-12 DIAGNOSIS — Z952 Presence of prosthetic heart valve: Secondary | ICD-10-CM | POA: Diagnosis not present

## 2020-12-12 DIAGNOSIS — I482 Chronic atrial fibrillation, unspecified: Secondary | ICD-10-CM | POA: Diagnosis not present

## 2020-12-12 DIAGNOSIS — I442 Atrioventricular block, complete: Secondary | ICD-10-CM

## 2020-12-12 LAB — BASIC METABOLIC PANEL
Anion gap: 12 (ref 5–15)
BUN: 51 mg/dL — ABNORMAL HIGH (ref 8–23)
CO2: 24 mmol/L (ref 22–32)
Calcium: 8.9 mg/dL (ref 8.9–10.3)
Chloride: 96 mmol/L — ABNORMAL LOW (ref 98–111)
Creatinine, Ser: 1.83 mg/dL — ABNORMAL HIGH (ref 0.61–1.24)
GFR, Estimated: 38 mL/min — ABNORMAL LOW (ref >60)
Glucose, Bld: 151 mg/dL — ABNORMAL HIGH (ref 70–99)
Potassium: 4 mmol/L (ref 3.5–5.1)
Sodium: 132 mmol/L — ABNORMAL LOW (ref 135–145)

## 2020-12-12 LAB — BRAIN NATRIURETIC PEPTIDE: B Natriuretic Peptide: 1402.2 pg/mL — ABNORMAL HIGH (ref 0.0–100.0)

## 2020-12-12 LAB — CBC
HCT: 30.5 % — ABNORMAL LOW (ref 39.0–52.0)
Hemoglobin: 9.6 g/dL — ABNORMAL LOW (ref 13.0–17.0)
MCH: 26.9 pg (ref 26.0–34.0)
MCHC: 31.5 g/dL (ref 30.0–36.0)
MCV: 85.4 fL (ref 80.0–100.0)
Platelets: 165 10*3/uL (ref 150–400)
RBC: 3.57 MIL/uL — ABNORMAL LOW (ref 4.22–5.81)
RDW: 17.7 % — ABNORMAL HIGH (ref 11.5–15.5)
WBC: 7.8 10*3/uL (ref 4.0–10.5)
nRBC: 0 % (ref 0.0–0.2)

## 2020-12-12 MED ORDER — POTASSIUM CHLORIDE CRYS ER 20 MEQ PO TBCR: 30.0000 meq | EXTENDED_RELEASE_TABLET | Freq: Two times a day (BID) | ORAL | 3 refills | Status: DC

## 2020-12-12 MED ORDER — METOLAZONE 2.5 MG PO TABS: 2.5000 mg | ORAL_TABLET | ORAL | 1 refills | Status: DC

## 2020-12-12 NOTE — Patient Instructions (Addendum)
Take Metolazone 2.5 mg every Monday  Take extra 40 meq (2 tabs) of Potassium on Mondays when you take Metolazone  Labs done today, your results will be available in MyChart, we will contact you for abnormal readings.  Your physician recommends that you return for lab work in: 2 weeks, we have provided you a prescription to have this done locally  Your physician recommends that you schedule a follow-up appointment in: 4 weeks  Do the following things EVERYDAY: Weigh yourself in the morning before breakfast. Write it down and keep it in a log. Take your medicines as prescribed Eat low salt foods--Limit salt (sodium) to 2000 mg per day.  Stay as active as you can everyday Limit all fluids for the day to less than 2 liters  If you have any questions or concerns before your next appointment please send [place] a message through [place] or call our office at [phone].    TO LEAVE A MESSAGE FOR THE NURSE SELECT OPTION 2, PLEASE LEAVE A MESSAGE INCLUDING: YOUR NAME DATE OF BIRTH CALL BACK NUMBER REASON FOR CALL**this is important as we prioritize the call backs  YOU WILL RECEIVE A CALL BACK THE SAME DAY AS LONG AS YOU CALL BEFORE 4:00 PM  At [facility], you and your health needs are our priority. As part of our continuing mission to provide you with exceptional heart care, we have created designated Provider Care Teams. These Care Teams include your primary Cardiologist (physician) and Advanced Practice Providers (APPs- Physician Assistants and Nurse Practitioners) who all work together to provide you with the care you need, when you need it.   You may see any of the following providers on your designated Care Team at your next follow up: Dr Glori Bickers Dr Loralie Champagne Dr Patrice Paradise, NP Lyda Jester, [place] Ginnie Smart Audry Riles, PharmD   Please be sure to bring in all your medications bottles to every appointment.

## 2020-12-19 ENCOUNTER — Ambulatory Visit: Payer: Medicare HMO | Admitting: "Endocrinology

## 2020-12-21 ENCOUNTER — Ambulatory Visit: Payer: Medicare HMO | Admitting: Cardiology

## 2020-12-21 ENCOUNTER — Ambulatory Visit: Payer: Medicare HMO | Admitting: *Deleted

## 2020-12-21 DIAGNOSIS — I509 Heart failure, unspecified: Secondary | ICD-10-CM

## 2020-12-21 DIAGNOSIS — E1159 Type 2 diabetes mellitus with other circulatory complications: Secondary | ICD-10-CM

## 2020-12-21 NOTE — Patient Instructions (Addendum)
Visit Information   Goals Addressed             This Visit's Progress    Monitor and Manage My Blood Sugar- Diabetes Type 2       Timeframe:  Long-Range Goal Priority:  Medium Start Date:        09/21/2020                     Expected End Date:     03/23/2021                  Follow Up Date- 01/25/2021   - check blood sugar at prescribed times and keep a log, take log to all doctor's visits - check blood sugar if I feel it is too high or too low - look over information on foot care sent via My Chart - inspect your feet daily for cuts, redness, calloused areas and report these to your doctor - be mindful of carbohydrate intake with foods such as bread, pasta, rice, etc that will elevate blood sugar - try to do some type of exercise daily- walking is good- do not over exert yourself - follow up with Dr. Dorris Fetch 11/9   Why is this important?   Checking your blood sugar at home helps to keep it from getting very high or very low.  Writing the results in a diary or log helps the doctor know how to care for you.  Your blood sugar log should have the time, date and the results.  Also, write down the amount of insulin or other medicine that you take.  Other information, like what you ate, exercise done and how you were feeling, will also be helpful.     Notes:      Track and Manage Symptoms-Heart Failure       Timeframe:  Long-Range Goal Priority:  High Start Date:             09/21/2020                Expected End Date:   03/23/2021                    Follow Up Date - 01/25/2021   - continue to follow CHF rescue plan, emphasis on yellow zone and knowing how you feel each day - eat more whole grains, fruits and vegetables, lean meats and healthy fats - follow rescue plan if symptoms flare-up (yellow and red zone) - know when to call the doctor- such as weight gain 3 pounds overnight or 5 pounds in one week, increased swelling. - look over education- fluid restrictions- sent via My  Chart - notify your doctor if have any GI bleeding - continue to follow low sodium diet with no more than 2027m sodium per day - continue fluid restrictions 2 Liters or less per day   Why is this important?   You will be able to handle your symptoms better if you keep track of them.  Making some simple changes to your lifestyle will help.  Eating healthy is one thing you can do to take good care of yourself.    Notes:         Patient verbalizes understanding of instructions provided today and agrees to view in [facility]   Telephone follow up appointment with care management team member scheduled for:  11/2/2022Fluid Restriction Fluid restriction means that a person needs to limit the amount of fluid he or she  drinks each day due to certain health conditions. The amount of fluid that a person is allowed each day (fluid allowance) may depend on several things, such as: Kidney function. How much fluid the body is holding on to (retaining). Blood pressure. Heart function. Blood sodium level. It is important to carefully measure and keep track of the amount of fluid that is consumed each day. What is my plan? Your health care provider recommends that you limit your fluid intake to __________ per day. What counts toward my fluid intake? Your fluid intake includes all liquids that you drink and any foods that become liquid at room temperature. Examples of some fluids that you will have to limit include: Tea, coffee, soda, lemonade, milk, water, juice, sports drinks, and nutritional supplement beverages. Alcoholic beverages. Cream. Gravy. Ice cubes. Soup and broth. The following are examples of foods that become liquid at room temperature. These foods will also count toward your fluid intake. Ice cream and ice milk. Frozen yogurt and sherbet. Frozen ice pops. Flavored gelatin. How do I keep track of my fluid intake? Each morning, fill a jug with the amount of water that is equal to  your daily fluid allowance. You can use this water as a guideline for fluid allowance. Each time you take in any form of fluid (including ice cubes and foods that become liquid at room temperature), pour an equal amount of water out of the container. This helps you to see how much fluid you are consuming and how much more fluid you can take in during the rest of the day. The following conversions may also be helpful in measuring your fluid intake: 1 cup equals 8 oz (240 mL).  cup equals 6 oz (180 mL). ? cup equals 5? oz (160 mL).  cup equals 4 oz (120 mL). ? cup equals 2? oz (80 mL).  cup equals 2 oz (60 mL). 2 Tbsp equals 1 oz (30 mL). What are tips for following this plan? General instructions Make sure that you stay within your recommended fluid allowance each day. Always measure and keep track of your fluids, including ice cubes and foods that become liquid at room temperature. Use small cups and glasses and learn to sip fluids slowly. Try eating frozen fruits between meals, such as grapes or strawberries. These can satisfy thirst without adding to your fluid intake. Swallow your pills with meals or soft foods, such as applesauce or mashed potatoes, instead of with liquids. Doing this helps you save your fluid allowance for something that you enjoy. Weigh yourself each day   Weigh yourself every day. Keeping track of your daily weight can help you and your health care provider notice as soon as possible if your body is retaining fluid. Follow this sequence every morning: Urinate. Weigh yourself. Eat breakfast. Wear the same amount of clothing each time you weigh yourself. Write down your daily weight. Give this weight record to your health care provider. If your weight is going up, you may be retaining too much fluid. Every 1 lb (0.45 kg) of body weight that you gain is a sign that your body is retaining 2 cups (480 mL) of fluid.  Manage your thirst Add lemon juice or a slice of  fresh lemon to water or ice. Doing this helps to satisfy your thirst. Freeze fruit juice or water in an ice cube tray. Use this as part of your fluid allowance. These cubes are useful for quenching your thirst. Before you freeze the juice  or water, measure how much liquid you use to fill a cube section of the ice tray. Subtract this amount from your day's allowance each time you consume a frozen cube. Avoid salty, or high sodium, foods. These foods make you thirsty and make it more difficult to stay within your daily fluid allowance. Keep the temperature in your home at a cooler level. Keep the air in your home as humid as possible. Dry air increases thirst. Avoid being out in the hot sun. This can cause you to sweat and become thirsty. To help avoid dry mouth, brush your teeth often or rinse out your mouth with mouthwash. Lemon wedges, hard sour candies, chewing gum, or breath spray may also help to moisten your mouth. What are some signs that I may be taking in too much fluid? You may be taking in too much fluid if: Your weight increases. Contact your health care provider if you gain weight rapidly. Your face, hands, legs, feet, and abdomen start to swell. You have trouble breathing. Summary Fluid restriction means that a person needs to limit the amount of fluid he or she drinks each day due to certain health conditions. The amount of fluid that you are allowed each day may depend on kidney function and other factors. It is important to carefully measure and keep track of the amount of fluid that you consume each day. Your fluid intake includes all liquids that you drink, as well as any foods that become liquid at room temperature, such as ice cream, ice cubes, and gelatin. You may be taking in too much fluid if your weight increases, your body starts to swell, or you have trouble breathing. This information is not intended to replace advice given to you by your health care provider. Make sure  you discuss any questions you have with your health care provider. Document Revised: 12/29/2019 Document Reviewed: 12/29/2019 Elsevier Patient Education  2022 [place]. Diabetes Mellitus and [place] care is an important part of your health, especially when you have diabetes. Diabetes may cause you to have problems because of poor blood flow (circulation) to your feet and legs, which can cause your skin to: Become thinner and drier. Break more easily. Heal more slowly. Peel and crack. You may also have nerve damage (neuropathy) in your legs and feet, causing decreased feeling in them. This means that you may not notice minor injuries to your feet that could lead to more serious problems. Noticing and addressing any potential problems early is the best way to prevent future foot problems. How to care for your feet Foot hygiene  Wash your feet daily with warm water and mild soap. Do not use hot water. Then, pat your feet and the areas between your toes until they are completely dry. Do not soak your feet as this can dry your skin. Trim your toenails straight across. Do not dig under them or around the cuticle. File the edges of your nails with an emery board or nail file. Apply a moisturizing lotion or petroleum jelly to the skin on your feet and to dry, brittle toenails. Use lotion that does not contain alcohol and is unscented. Do not apply lotion between your toes. Shoes and socks Wear clean socks or stockings every day. Make sure they are not too tight. Do not wear knee-high stockings since they may decrease blood flow to your legs. Wear shoes that fit properly and have enough cushioning. Always look in your shoes before you put them  on to be sure there are no objects inside. To break in new shoes, wear them for just a few hours a day. This prevents injuries on your feet. Wounds, scrapes, corns, and calluses  Check your feet daily for blisters, cuts, bruises, sores, and redness. If  you cannot see the bottom of your feet, use a mirror or ask someone for help. Do not cut corns or calluses or try to remove them with medicine. If you find a minor scrape, cut, or break in the skin on your feet, keep it and the skin around it clean and dry. You may clean these areas with mild soap and water. Do not clean the area with peroxide, alcohol, or iodine. If you have a wound, scrape, corn, or callus on your foot, look at it several times a day to make sure it is healing and not infected. Check for: Redness, swelling, or pain. Fluid or blood. Warmth. Pus or a bad smell. General tips Do not cross your legs. This may decrease blood flow to your feet. Do not use heating pads or hot water bottles on your feet. They may burn your skin. If you have lost feeling in your feet or legs, you may not know this is happening until it is too late. Protect your feet from hot and cold by wearing shoes, such as at the beach or on hot pavement. Schedule a complete foot exam at least once a year (annually) or more often if you have foot problems. Report any cuts, sores, or bruises to your health care provider immediately. Where to find more information American Diabetes Association: www.diabetes.org Association of Diabetes Care & Education Specialists: www.diabeteseducator.org Contact a health care provider if: You have a medical condition that increases your risk of infection and you have any cuts, sores, or bruises on your feet. You have an injury that is not healing. You have redness on your legs or feet. You feel burning or tingling in your legs or feet. You have pain or cramps in your legs and feet. Your legs or feet are numb. Your feet always feel cold. You have pain around any toenails. Get help right away if: You have a wound, scrape, corn, or callus on your foot and: You have pain, swelling, or redness that gets worse. You have fluid or blood coming from the wound, scrape, corn, or  callus. Your wound, scrape, corn, or callus feels warm to the touch. You have pus or a bad smell coming from the wound, scrape, corn, or callus. You have a fever. You have a red line going up your leg. Summary Check your feet every day for blisters, cuts, bruises, sores, and redness. Apply a moisturizing lotion or petroleum jelly to the skin on your feet and to dry, brittle toenails. Wear shoes that fit properly and have enough cushioning. If you have foot problems, report any cuts, sores, or bruises to your health care provider immediately. Schedule a complete foot exam at least once a year (annually) or more often if you have foot problems. This information is not intended to replace advice given to you by your health care provider. Make sure you discuss any questions you have with your health care provider. Document Revised: 10/01/2019 Document Reviewed: 10/01/2019 Elsevier Patient Education  2022 [place]   Jacqlyn Larsen [facility], BSN RN [employer] copy[employer] Family Medicine [phone]

## 2020-12-21 NOTE — Chronic Care Management (AMB) (Signed)
Care Management    RN Visit Note  12/21/2020 Name: John [name] MRN: [id number] DOB: 04/10/1945  Subjective: John [name] is a 75 y.o. year old male who is a primary care patient of Erven Colla, DO. The care management team was consulted for assistance with disease management and care coordination needs.    Engaged with patient by telephone for follow up visit in response to provider referral for case management and/or care coordination services.   Consent to Services:   John [name] was given information about Care Management services today including:  Care Management services includes personalized support from designated clinical staff supervised by his physician, including individualized plan of care and coordination with other care providers 24/7 contact phone numbers for assistance for urgent and routine care needs. The patient may stop case management services at any time by phone call to the office staff.  Patient agreed to services and consent obtained.   Assessment: Review of patient past medical history, allergies, medications, health status, including review of consultants reports, laboratory and other test data, was performed as part of comprehensive evaluation and provision of chronic care management services.   SDOH (Social Determinants of Health) assessments and interventions performed:    Care Plan  No Known Allergies  Outpatient Encounter Medications as of 12/21/2020  Medication Sig   ACCU-CHEK AVIVA PLUS test strip TEST BLOOD SUGAR TWICE DAILY BEFORE BREAKFAST AND AT BEDTIME   acetaminophen (TYLENOL) 325 MG tablet Take 2 tablets (650 mg total) by mouth every 4 (four) hours as needed for headache or mild pain.   allopurinol (ZYLOPRIM) 300 MG tablet Take 0.5 tablets (150 mg total) by mouth daily. (Patient taking differently: Take 150 mg by mouth daily. As needed)   aspirin EC 81 MG tablet Take 1 tablet (81 mg total) by mouth daily with breakfast.    benzonatate (TESSALON) 100 MG capsule Take 1 capsule (100 mg total) by mouth 3 (three) times daily as needed for cough.   carvedilol (COREG) 3.125 MG tablet TAKE 1 TABLET IN THE MORNING AND 2 TABLETS IN THE EVENING   colchicine 0.6 MG tablet Take 1 tablet (0.6 mg total) by mouth daily as needed.   Dulaglutide (TRULICITY) 1.5 RX/5.4MG SOPN INJECT 1.5MG (1 PEN) SUBCUTANEOUSLY EVERY WEEK   insulin degludec (TRESIBA FLEXTOUCH) 100 UNIT/ML FlexTouch Pen Inject 40 Units into the skin at bedtime.   Insulin Pen Needle (BD PEN NEEDLE NANO U/F) 32G X 4 MM MISC 1 each by Does not apply route 4 (four) times daily.   loratadine (CLARITIN) 10 MG tablet TAKE 1 TABLET BY MOUTH EVERY DAY   metolazone (ZAROXOLYN) 2.5 MG tablet Take 1 tablet (2.5 mg total) by mouth once a week. Every Monday   Multiple Vitamins-Minerals (ONE-A-DAY MENS 50+) TABS Take 1 tablet by mouth daily with breakfast.   pantoprazole (PROTONIX) 40 MG tablet Take 1 tablet (40 mg total) by mouth 2 (two) times daily before a meal.   potassium chloride SA (KLOR-CON M20) 20 MEQ tablet Take 1.5 tablets (30 mEq total) by mouth 2 (two) times daily. Take 2 extra tabs on Mon with metolazone   spironolactone (ALDACTONE) 25 MG tablet Take 0.5 tablets (12.5 mg total) by mouth daily.   sucralfate (CARAFATE) 1 g tablet Take 1 tablet (1 g total) by mouth 4 (four) times daily. (Patient taking differently: Take 1 g by mouth 4 (four) times daily. As needed per patient.)   torsemide (DEMADEX) 20 MG tablet TAKE 3 TABLETS (60 MG  TOTAL) BY MOUTH 2 (TWO) TIMES DAILY.   No facility-administered encounter medications on file as of 12/21/2020.    Patient Active Problem List   Diagnosis Date Noted   Duodenal ulcer 11/22/2020   Iron deficiency anemia due to chronic blood loss 11/22/2020   Itching 11/22/2020   Acute GI bleeding 10/19/2020   GIB (gastrointestinal bleeding) 10/17/2020   Anemia    Hematoma, chest wall 05/15/2020   Polyarticular gout 05/15/2020   S/P TAVR  (transcatheter aortic valve replacement) 05/03/2020   Severe aortic stenosis 04/25/2020   Chronic atrial fibrillation ([place]) 04/25/2020   Acute on chronic systolic CHF (congestive heart failure) ([place]) 04/25/2020   Permanent atrial fibrillation ([place]) 03/10/2020   Calculus of gallbladder without cholecystitis without obstruction    Thrombocytopenia ([place]) 03/09/2020   Hypoalbuminemia 03/09/2020   Transaminitis 03/09/2020   NASH (nonalcoholic steatohepatitis) 03/03/2020   Elevated LFTs 03/03/2020   Lower leg edema 10/14/2019   Class 2 obesity    Hyponatremia 11/11/2018   CKD (chronic kidney disease) stage 3, GFR 30-59 ml/min (HCC)    Paroxysmal atrial fibrillation ([place])    Pacemaker 03/02/2018   Atrial pacemaker lead displacement 12/07/2015   NSVT (nonsustained ventricular tachycardia) ([place]) 08/28/2015   Bradycardia 08/25/2015   Current use of long term anticoagulation 05/04/2011   Atrial flutter ([place]) 01/05/2011   DM type 2 causing vascular disease ([place]) 12/23/2009   Mixed hyperlipidemia 12/23/2009   Essential hypertension, benign 12/23/2009    Conditions to be addressed/monitored: CHF and DMII  Care Plan : Diabetes Type 2 (Adult)  Updates made by Kassie Mends, RN since 12/21/2020 12:00 AM     Problem: Glycemic Management (Diabetes, Type 2)   Priority: Medium     Long-Range Goal: Glycemic Management Optimized   Start Date: 09/21/2020  Expected End Date: 03/23/2021  This Visit's Progress: Not on track  Recent Progress: Not on track  Priority: Medium  Note:   Objective:  Lab Results  Component Value Date   HGBA1C 9.9 (A) 08/25/2020   Lab Results  Component Value Date   CREATININE 1.24 09/12/2020   CREATININE 1.48 (H) 09/06/2020   CREATININE 1.34 (H) 08/03/2020   Lab Results  Component Value Date   EGFR 61 09/12/2020   Current Barriers:  Knowledge Deficits related to basic Diabetes pathophysiology and self care/management- ADA diet, signs/ symptoms hypo and  hyperglycemia Pt reports he checks CBG BID with fasting ranges 90-140's range, random ranges 100's with some readings near or at 200. Reports taking all medications as prescribed, is trying to do well with his diet and following up with endocrinologist. Case Manager Clinical Goal(s):  patient will demonstrate improved adherence to prescribed treatment plan for diabetes self care/management as evidenced by: daily monitoring and recording of CBG  adherence to ADA/ carb modified diet adherence to prescribed medication regimen contacting provider for new or worsened symptoms or questions Interventions:  Collaboration with Erven Colla, DO regarding development and update of comprehensive plan of care as evidenced by provider attestation and co-signature Inter-disciplinary care team collaboration (see longitudinal plan of care) Reinforced education to patient about basic DM disease process Reviewed medications with patient and discussed importance of medication adherence Reviewed plans with patient for ongoing care management follow up and provided patient with direct contact information for care management team Provided patient with written educational materials via My Chart- foot care Reviewed scheduled/upcoming provider appointments including:   9/28  Dr. Harl Bowie Review of patient status, including review of consultants reports, relevant laboratory  and other test results, and medications completed. Reviewed carbohydrate modified diet and foods high in carbohydrates to limit Reviewed signs/ symptoms hypoglycemia and actions to take Encouraged pt to get outside everyday and do some type of exercise daily Self-Care Activities - Attends all scheduled provider appointments Patient Goals: - manage portion size - read food labels for fat, fiber, carbohydrates and portion size - check feet daily for cuts, sores or redness - wear comfortable, cotton socks - wear comfortable, well-fitting shoes -  check blood sugar at prescribed times and keep a log, take log to all doctor's visits - check blood sugar if I feel it is too high or too low - look over information on foot care sent via My Chart - inspect your feet daily for cuts, redness, calloused areas and report these to your doctor - be mindful of carbohydrate intake with foods such as bread, pasta, rice, etc that will elevate blood sugar - try to do some type of exercise daily- walking is good- do not over exert yourself - follow up with Dr. Dorris Fetch 11/9 Follow Up Plan: Telephone follow up appointment with care management team member scheduled for:   01/25/2021    Care Plan : Heart Failure (Adult)  Updates made by Kassie Mends, RN since 12/21/2020 12:00 AM     Problem: Symptom Exacerbation (Heart Failure)   Priority: High     Long-Range Goal: Symptom Exacerbation Prevented or Minimized   Start Date: 09/21/2020  Expected End Date: 03/23/2021  This Visit's Progress: On track  Recent Progress: On track  Priority: High  Note:   Current Barriers:  Knowledge deficit related to basic heart failure pathophysiology and self care management- needs reinforcement of HF action plan/ symptom management Pt reports he lives with spouse and is independent in all aspects of his care, he does do some walking, does not get out in the community for activities etc.  Has sister in law he can call on also if needed.  Pt reports he weighs daily with weight today 211 pounds, has all medications and taking as prescribed. Hospitalized 7/25-7/30 for weakness, GI bleeding, had endoscopy to locate source of GI bleed, had follow up endoscopy in September and continues to follow up with GI doctor. Patient denies any GI bleeding today.  Pt reports he had visit with [facility] 9/19, states he is taking all medications as prescribed and verbalizes understanding of how to take diuretics and potassium. Case Manager Clinical Goal(s):  patient will weigh self  daily and record- ongoing patient will verbalize understanding of Heart Failure Action Plan and when to call doctor patient will take all Heart Failure mediations as prescribed patient will weigh daily and record (notifying MD of 3 lb weight gain over night or 5 lb in a week) Interventions:  Collaboration with Erven Colla, DO regarding development and update of comprehensive plan of care as evidenced by provider attestation and co-signature Inter-disciplinary care team collaboration (see longitudinal plan of care Reviewed Heart Failure Action Plan with emphasis on yellow zone Reinforced importance of daily weight and advised patient to weigh and record daily Reinforced role of diuretics in prevention of fluid overload and management of heart failure Reviewed importance of low sodium diet and avoiding salty snacks, fast food Reviewed 2 Liters or less per day fluid restrictions Send education via My Chart- fluid restrictions Reinforced importance of informing doctor for any GI bleeding Reviewed all upcoming scheduled appointments-  9/28  Dr. Harl Bowie  11/9 Dr. Dorris Fetch  Self-Care Activities: Attends all scheduled provider appointments Calls provider office for new concerns or questions Patient Goals: - continue to follow CHF rescue plan, emphasis on yellow zone and knowing how you feel each day - eat more whole grains, fruits and vegetables, lean meats and healthy fats - follow rescue plan if symptoms flare-up (yellow and red zone) - know when to call the doctor- such as weight gain 3 pounds overnight or 5 pounds in one week, increased swelling. - look over education- fluid restrictions- sent via My Chart - notify your doctor if have any GI bleeding - continue to follow low sodium diet with no more than 2052m sodium per day - continue fluid restrictions 2 Liters or less per day Follow Up Plan: Telephone follow up appointment with care management team member scheduled for: 01/25/2021      Plan: Telephone follow up appointment with care management team member scheduled for:  01/25/2021  JJacqlyn LarsenRRehabilitation Institute Of [facility] BSN RN Case Manager [phone]

## 2020-12-26 ENCOUNTER — Other Ambulatory Visit: Payer: Self-pay | Admitting: Internal Medicine

## 2020-12-26 DIAGNOSIS — I5022 Chronic systolic (congestive) heart failure: Secondary | ICD-10-CM | POA: Diagnosis not present

## 2020-12-27 ENCOUNTER — Encounter (INDEPENDENT_AMBULATORY_CARE_PROVIDER_SITE_OTHER): Payer: Self-pay

## 2020-12-27 LAB — CBC/DIFF AMBIGUOUS DEFAULT
Basophils Absolute: 0.1 10*3/uL (ref 0.0–0.2)
Basos: 1 %
EOS (ABSOLUTE): 0.3 10*3/uL (ref 0.0–0.4)
Eos: 4 %
Hematocrit: 31 % — ABNORMAL LOW (ref 37.5–51.0)
Hemoglobin: 9.8 g/dL — ABNORMAL LOW (ref 13.0–17.7)
Immature Grans (Abs): 0 10*3/uL (ref 0.0–0.1)
Immature Granulocytes: 0 %
Lymphocytes Absolute: 0.7 10*3/uL (ref 0.7–3.1)
Lymphs: 10 %
MCH: 26.1 pg — ABNORMAL LOW (ref 26.6–33.0)
MCHC: 31.6 g/dL (ref 31.5–35.7)
MCV: 83 fL (ref 79–97)
Monocytes Absolute: 0.8 10*3/uL (ref 0.1–0.9)
Monocytes: 11 %
Neutrophils Absolute: 5.2 10*3/uL (ref 1.4–7.0)
Neutrophils: 74 %
Platelets: 171 10*3/uL (ref 150–450)
RBC: 3.75 x10E6/uL — ABNORMAL LOW (ref 4.14–5.80)
RDW: 17.4 % — ABNORMAL HIGH (ref 11.6–15.4)
WBC: 7 10*3/uL (ref 3.4–10.8)

## 2020-12-27 LAB — BASIC METABOLIC PANEL
BUN/Creatinine Ratio: 25 — ABNORMAL HIGH (ref 10–24)
BUN: 54 mg/dL — ABNORMAL HIGH (ref 8–27)
CO2: 19 mmol/L — ABNORMAL LOW (ref 20–29)
Calcium: 8.7 mg/dL (ref 8.6–10.2)
Chloride: 96 mmol/L (ref 96–106)
Creatinine, Ser: 2.16 mg/dL — ABNORMAL HIGH (ref 0.76–1.27)
Glucose: 263 mg/dL — ABNORMAL HIGH (ref 70–99)
Potassium: 5 mmol/L (ref 3.5–5.2)
Sodium: 133 mmol/L — ABNORMAL LOW (ref 134–144)
eGFR: 31 mL/min/{1.73_m2} — ABNORMAL LOW (ref >59)

## 2020-12-27 LAB — SPECIMEN STATUS REPORT

## 2020-12-28 NOTE — Patient Instructions (Signed)
John [name]  12/28/2020     @PREFPERIOPPHARMACY @   Your procedure is scheduled on  01/03/2021.   Report to [facility] at  [phone] A.M.   Call this number if you have problems the morning of surgery:  [phone]   Remember:  Follow the diet instructions given to you by the office.      Take 20 units of tresiba the night before your procedure.    DO NOT take any medications for diabetes the morning of your procedure.    Take these medicines the morning of surgery with A SIP OF WATER            allopurinol, carvedilol, protonix.    Do not wear jewelry, make-up or nail polish.  Do not wear lotions, powders, or perfumes, or deodorant.  Do not shave 48 hours prior to surgery.  Men may shave face and neck.  Do not bring valuables to the hospital.  [facility] is not responsible for any belongings or valuables.  Contacts, dentures or bridgework may not be worn into surgery.  Leave your suitcase in the car.  After surgery it may be brought to your room.  For patients admitted to the hospital, discharge time will be determined by your treatment team.  Patients discharged the day of surgery will not be allowed to drive home and must have someone with them for 24 hours.    Special instructions:           DO NOT smoke tobacco or vape for 24 hours before your procedure.   Please read over the following fact sheets that you were given. Anesthesia Post-op Instructions and Care and Recovery After Surgery      Upper Endoscopy, Adult, Care After This sheet gives you information about how to care for yourself after your procedure. Your health care provider may also give you more specific instructions. If you have problems or questions, contact your health care provider. What can I expect after the procedure? After the procedure, it is common to have: A sore throat. Mild stomach pain or discomfort. Bloating. Nausea. Follow these instructions at home:  Follow  instructions from your health care provider about what to eat or drink after your procedure. Return to your normal activities as told by your health care provider. Ask your health care provider what activities are safe for you. Take over-the-counter and prescription medicines only as told by your health care provider. If you were given a sedative during the procedure, it can affect you for several hours. Do not drive or operate machinery until your health care provider says that it is safe. Keep all follow-up visits as told by your health care provider. This is important. Contact a health care provider if you have: A sore throat that lasts longer than one day. Trouble swallowing. Get help right away if: You vomit blood or your vomit looks like coffee grounds. You have: A fever. Bloody, black, or tarry stools. A severe sore throat or you cannot swallow. Difficulty breathing. Severe pain in your chest or abdomen. Summary After the procedure, it is common to have a sore throat, mild stomach discomfort, bloating, and nausea. If you were given a sedative during the procedure, it can affect you for several hours. Do not drive or operate machinery until your health care provider says that it is safe. Follow instructions from your health care provider about what to eat or drink after your procedure. Return to your  normal activities as told by your health care provider. This information is not intended to replace advice given to you by your health care provider. Make sure you discuss any questions you have with your health care provider. Document Revised: 03/10/2019 Document Reviewed: 08/12/2017 Elsevier Patient Education  2022 [place] After This sheet gives you information about how to care for yourself after your procedure. Your health care provider may also give you more specific instructions. If you have problems or questions, contact your health care  provider. What can I expect after the procedure? After the procedure, it is common to have: Tiredness. Forgetfulness about what happened after the procedure. Impaired judgment for important decisions. Nausea or vomiting. Some difficulty with balance. Follow these instructions at home: For the time period you were told by your health care provider:   Rest as needed. Do not participate in activities where you could fall or become injured. Do not drive or use machinery. Do not drink alcohol. Do not take sleeping pills or medicines that cause drowsiness. Do not make important decisions or sign legal documents. Do not take care of children on your own. Eating and drinking Follow the diet that is recommended by your health care provider. Drink enough fluid to keep your urine pale yellow. If you vomit: Drink water, juice, or soup when you can drink without vomiting. Make sure you have little or no nausea before eating solid foods. General instructions Have a responsible adult stay with you for the time you are told. It is important to have someone help care for you until you are awake and alert. Take over-the-counter and prescription medicines only as told by your health care provider. If you have sleep apnea, surgery and certain medicines can increase your risk for breathing problems. Follow instructions from your health care provider about wearing your sleep device: Anytime you are sleeping, including during daytime naps. While taking prescription pain medicines, sleeping medicines, or medicines that make you drowsy. Avoid smoking. Keep all follow-up visits as told by your health care provider. This is important. Contact a health care provider if: You keep feeling nauseous or you keep vomiting. You feel light-headed. You are still sleepy or having trouble with balance after 24 hours. You develop a rash. You have a fever. You have redness or swelling around the IV site. Get help  right away if: You have trouble breathing. You have new-onset confusion at home. Summary For several hours after your procedure, you may feel tired. You may also be forgetful and have poor judgment. Have a responsible adult stay with you for the time you are told. It is important to have someone help care for you until you are awake and alert. Rest as told. Do not drive or operate machinery. Do not drink alcohol or take sleeping pills. Get help right away if you have trouble breathing, or if you suddenly become confused. This information is not intended to replace advice given to you by your health care provider. Make sure you discuss any questions you have with your health care provider. Document Revised: 11/26/2019 Document Reviewed: 02/12/2019 Elsevier Patient Education  2022 [employer].

## 2020-12-29 ENCOUNTER — Encounter (HOSPITAL_COMMUNITY): Payer: Self-pay

## 2020-12-29 ENCOUNTER — Encounter (HOSPITAL_COMMUNITY)
Admission: RE | Admit: 2020-12-29 | Discharge: 2020-12-29 | Disposition: A | Discharge status: Home / Self Care | Payer: Medicare HMO | Source: Ambulatory Visit | Attending: Gastroenterology | Admitting: Gastroenterology

## 2020-12-29 ENCOUNTER — Other Ambulatory Visit: Payer: Self-pay

## 2020-12-30 ENCOUNTER — Telehealth (HOSPITAL_COMMUNITY): Payer: Self-pay | Admitting: *Deleted

## 2020-12-30 ENCOUNTER — Encounter (HOSPITAL_COMMUNITY): Payer: Self-pay | Admitting: *Deleted

## 2020-12-30 ENCOUNTER — Emergency Department (HOSPITAL_COMMUNITY): Payer: Medicare HMO

## 2020-12-30 ENCOUNTER — Other Ambulatory Visit: Payer: Self-pay

## 2020-12-30 ENCOUNTER — Inpatient Hospital Stay (HOSPITAL_COMMUNITY)
Admission: EM | Admit: 2020-12-30 | Discharge: 2021-01-02 | DRG: 291 | Disposition: A | Discharge status: Home / Self Care | Payer: Medicare HMO | Attending: Internal Medicine | Admitting: Internal Medicine

## 2020-12-30 DIAGNOSIS — I2510 Atherosclerotic heart disease of native coronary artery without angina pectoris: Secondary | ICD-10-CM | POA: Diagnosis present

## 2020-12-30 DIAGNOSIS — Z952 Presence of prosthetic heart valve: Secondary | ICD-10-CM

## 2020-12-30 DIAGNOSIS — K219 Gastro-esophageal reflux disease without esophagitis: Secondary | ICD-10-CM | POA: Diagnosis present

## 2020-12-30 DIAGNOSIS — E876 Hypokalemia: Secondary | ICD-10-CM | POA: Diagnosis not present

## 2020-12-30 DIAGNOSIS — Z7982 Long term (current) use of aspirin: Secondary | ICD-10-CM | POA: Diagnosis not present

## 2020-12-30 DIAGNOSIS — I5023 Acute on chronic systolic (congestive) heart failure: Secondary | ICD-10-CM | POA: Diagnosis present

## 2020-12-30 DIAGNOSIS — Z7985 Long-term (current) use of injectable non-insulin antidiabetic drugs: Secondary | ICD-10-CM

## 2020-12-30 DIAGNOSIS — N179 Acute kidney failure, unspecified: Secondary | ICD-10-CM | POA: Diagnosis not present

## 2020-12-30 DIAGNOSIS — Z87891 Personal history of nicotine dependence: Secondary | ICD-10-CM | POA: Diagnosis not present

## 2020-12-30 DIAGNOSIS — Y92009 Unspecified place in unspecified non-institutional (private) residence as the place of occurrence of the external cause: Secondary | ICD-10-CM | POA: Diagnosis not present

## 2020-12-30 DIAGNOSIS — N189 Chronic kidney disease, unspecified: Secondary | ICD-10-CM | POA: Diagnosis not present

## 2020-12-30 DIAGNOSIS — Z20822 Contact with and (suspected) exposure to covid-19: Secondary | ICD-10-CM | POA: Diagnosis present

## 2020-12-30 DIAGNOSIS — Z9114 Patient's other noncompliance with medication regimen: Secondary | ICD-10-CM

## 2020-12-30 DIAGNOSIS — Z23 Encounter for immunization: Secondary | ICD-10-CM

## 2020-12-30 DIAGNOSIS — E1165 Type 2 diabetes mellitus with hyperglycemia: Secondary | ICD-10-CM | POA: Diagnosis present

## 2020-12-30 DIAGNOSIS — E1159 Type 2 diabetes mellitus with other circulatory complications: Secondary | ICD-10-CM | POA: Diagnosis present

## 2020-12-30 DIAGNOSIS — I428 Other cardiomyopathies: Secondary | ICD-10-CM | POA: Diagnosis not present

## 2020-12-30 DIAGNOSIS — Z862 Personal history of diseases of the blood and blood-forming organs and certain disorders involving the immune mechanism: Secondary | ICD-10-CM | POA: Diagnosis not present

## 2020-12-30 DIAGNOSIS — R0602 Shortness of breath: Secondary | ICD-10-CM | POA: Diagnosis present

## 2020-12-30 DIAGNOSIS — Z79899 Other long term (current) drug therapy: Secondary | ICD-10-CM

## 2020-12-30 DIAGNOSIS — N1832 Chronic kidney disease, stage 3b: Secondary | ICD-10-CM | POA: Diagnosis present

## 2020-12-30 DIAGNOSIS — D631 Anemia in chronic kidney disease: Secondary | ICD-10-CM | POA: Diagnosis present

## 2020-12-30 DIAGNOSIS — J309 Allergic rhinitis, unspecified: Secondary | ICD-10-CM | POA: Diagnosis present

## 2020-12-30 DIAGNOSIS — E1122 Type 2 diabetes mellitus with diabetic chronic kidney disease: Secondary | ICD-10-CM | POA: Diagnosis present

## 2020-12-30 DIAGNOSIS — Z8249 Family history of ischemic heart disease and other diseases of the circulatory system: Secondary | ICD-10-CM | POA: Diagnosis not present

## 2020-12-30 DIAGNOSIS — Z794 Long term (current) use of insulin: Secondary | ICD-10-CM

## 2020-12-30 DIAGNOSIS — I517 Cardiomegaly: Secondary | ICD-10-CM | POA: Diagnosis not present

## 2020-12-30 DIAGNOSIS — I480 Paroxysmal atrial fibrillation: Secondary | ICD-10-CM | POA: Diagnosis not present

## 2020-12-30 DIAGNOSIS — T502X5A Adverse effect of carbonic-anhydrase inhibitors, benzothiadiazides and other diuretics, initial encounter: Secondary | ICD-10-CM | POA: Diagnosis not present

## 2020-12-30 DIAGNOSIS — Z950 Presence of cardiac pacemaker: Secondary | ICD-10-CM | POA: Diagnosis not present

## 2020-12-30 DIAGNOSIS — I495 Sick sinus syndrome: Secondary | ICD-10-CM | POA: Diagnosis not present

## 2020-12-30 DIAGNOSIS — I130 Hypertensive heart and chronic kidney disease with heart failure and stage 1 through stage 4 chronic kidney disease, or unspecified chronic kidney disease: Principal | ICD-10-CM | POA: Diagnosis present

## 2020-12-30 DIAGNOSIS — I5021 Acute systolic (congestive) heart failure: Secondary | ICD-10-CM | POA: Diagnosis not present

## 2020-12-30 LAB — BASIC METABOLIC PANEL
Anion gap: 11 (ref 5–15)
BUN: 62 mg/dL — ABNORMAL HIGH (ref 8–23)
CO2: 23 mmol/L (ref 22–32)
Calcium: 8.8 mg/dL — ABNORMAL LOW (ref 8.9–10.3)
Chloride: 97 mmol/L — ABNORMAL LOW (ref 98–111)
Creatinine, Ser: 2.11 mg/dL — ABNORMAL HIGH (ref 0.61–1.24)
GFR, Estimated: 32 mL/min — ABNORMAL LOW (ref >60)
Glucose, Bld: 235 mg/dL — ABNORMAL HIGH (ref 70–99)
Potassium: 4.2 mmol/L (ref 3.5–5.1)
Sodium: 131 mmol/L — ABNORMAL LOW (ref 135–145)

## 2020-12-30 LAB — CBC WITH DIFFERENTIAL/PLATELET
Abs Immature Granulocytes: 0.03 10*3/uL (ref 0.00–0.07)
Basophils Absolute: 0.1 10*3/uL (ref 0.0–0.1)
Basophils Relative: 1 %
Eosinophils Absolute: 0.4 10*3/uL (ref 0.0–0.5)
Eosinophils Relative: 5 %
HCT: 30.7 % — ABNORMAL LOW (ref 39.0–52.0)
Hemoglobin: 9.6 g/dL — ABNORMAL LOW (ref 13.0–17.0)
Immature Granulocytes: 0 %
Lymphocytes Relative: 14 %
Lymphs Abs: 0.9 10*3/uL (ref 0.7–4.0)
MCH: 25.9 pg — ABNORMAL LOW (ref 26.0–34.0)
MCHC: 31.3 g/dL (ref 30.0–36.0)
MCV: 83 fL (ref 80.0–100.0)
Monocytes Absolute: 0.9 10*3/uL (ref 0.1–1.0)
Monocytes Relative: 13 %
Neutro Abs: 4.4 10*3/uL (ref 1.7–7.7)
Neutrophils Relative %: 67 %
Platelets: 128 10*3/uL — ABNORMAL LOW (ref 150–400)
RBC: 3.7 MIL/uL — ABNORMAL LOW (ref 4.22–5.81)
RDW: 19.1 % — ABNORMAL HIGH (ref 11.5–15.5)
WBC: 6.7 10*3/uL (ref 4.0–10.5)
nRBC: 0 % (ref 0.0–0.2)

## 2020-12-30 LAB — BRAIN NATRIURETIC PEPTIDE: B Natriuretic Peptide: 1430.2 pg/mL — ABNORMAL HIGH (ref 0.0–100.0)

## 2020-12-30 NOTE — ED Provider Notes (Signed)
Emergency Medicine Provider Triage Evaluation Note  John [name] , a 75 y.o. male  was evaluated in triage.  Pt complains of sob.  Review of Systems  Positive: Sob, fluid retention Negative: Fever, cp  Physical Exam  BP 102/65 (BP Location: Right Arm)   Pulse 74   Temp 98.5 F (36.9 C) (Oral)   Resp 14   SpO2 100%  Gen:   Awake, no distress   Resp:  Normal effort  MSK:   Moves extremities without difficulty  Other:  2+ pitting edema to BLE  Medical Decision Making  Medically screening exam initiated at 3:34 PM.  Appropriate orders placed.  [name] was informed that the remainder of the evaluation will be completed by another provider, this initial triage assessment does not replace that evaluation, and the importance of remaining in the ED until their evaluation is complete.  Hx of CHF, noticed increased SOB and fluid retention x 2 days. No cp.    Domenic Moras, PA-C 12/30/20 1534    Regan Lemming, MD 12/30/20 [id number]

## 2020-12-30 NOTE — Telephone Encounter (Signed)
Pts wife called stating pt weight is up 12lbs from last office visit. Pt c/o shortness of breath, BLEE, swelling in abdomen, weakness, and no appetite. No changes to meds. Per Marlyce Huge, PA and Amy Clegg,NP patient needs to go to the emergency room to be evaluated. Pts wife aware and agreeable with plan.

## 2020-12-30 NOTE — ED Triage Notes (Signed)
The pt reports that he has had sob for 2 days and a over night weight gain  no pain he is not on any 02 at home

## 2020-12-31 ENCOUNTER — Encounter (HOSPITAL_COMMUNITY): Payer: Self-pay | Admitting: Internal Medicine

## 2020-12-31 DIAGNOSIS — R0602 Shortness of breath: Secondary | ICD-10-CM

## 2020-12-31 DIAGNOSIS — Z7985 Long-term (current) use of injectable non-insulin antidiabetic drugs: Secondary | ICD-10-CM | POA: Diagnosis not present

## 2020-12-31 DIAGNOSIS — Z794 Long term (current) use of insulin: Secondary | ICD-10-CM | POA: Diagnosis not present

## 2020-12-31 DIAGNOSIS — D631 Anemia in chronic kidney disease: Secondary | ICD-10-CM | POA: Diagnosis present

## 2020-12-31 DIAGNOSIS — N179 Acute kidney failure, unspecified: Secondary | ICD-10-CM | POA: Diagnosis present

## 2020-12-31 DIAGNOSIS — E1159 Type 2 diabetes mellitus with other circulatory complications: Secondary | ICD-10-CM | POA: Diagnosis present

## 2020-12-31 DIAGNOSIS — Z23 Encounter for immunization: Secondary | ICD-10-CM | POA: Diagnosis present

## 2020-12-31 DIAGNOSIS — I480 Paroxysmal atrial fibrillation: Secondary | ICD-10-CM

## 2020-12-31 DIAGNOSIS — N189 Chronic kidney disease, unspecified: Secondary | ICD-10-CM

## 2020-12-31 DIAGNOSIS — E1165 Type 2 diabetes mellitus with hyperglycemia: Secondary | ICD-10-CM | POA: Diagnosis present

## 2020-12-31 DIAGNOSIS — I2510 Atherosclerotic heart disease of native coronary artery without angina pectoris: Secondary | ICD-10-CM | POA: Diagnosis present

## 2020-12-31 DIAGNOSIS — Y92009 Unspecified place in unspecified non-institutional (private) residence as the place of occurrence of the external cause: Secondary | ICD-10-CM | POA: Diagnosis not present

## 2020-12-31 DIAGNOSIS — Z7982 Long term (current) use of aspirin: Secondary | ICD-10-CM | POA: Diagnosis not present

## 2020-12-31 DIAGNOSIS — Z862 Personal history of diseases of the blood and blood-forming organs and certain disorders involving the immune mechanism: Secondary | ICD-10-CM | POA: Diagnosis not present

## 2020-12-31 DIAGNOSIS — Z9114 Patient's other noncompliance with medication regimen: Secondary | ICD-10-CM | POA: Diagnosis not present

## 2020-12-31 DIAGNOSIS — E876 Hypokalemia: Secondary | ICD-10-CM | POA: Diagnosis not present

## 2020-12-31 DIAGNOSIS — Z950 Presence of cardiac pacemaker: Secondary | ICD-10-CM | POA: Diagnosis not present

## 2020-12-31 DIAGNOSIS — Z8249 Family history of ischemic heart disease and other diseases of the circulatory system: Secondary | ICD-10-CM | POA: Diagnosis not present

## 2020-12-31 DIAGNOSIS — I5023 Acute on chronic systolic (congestive) heart failure: Secondary | ICD-10-CM | POA: Diagnosis present

## 2020-12-31 DIAGNOSIS — Z87891 Personal history of nicotine dependence: Secondary | ICD-10-CM | POA: Diagnosis not present

## 2020-12-31 DIAGNOSIS — E1122 Type 2 diabetes mellitus with diabetic chronic kidney disease: Secondary | ICD-10-CM | POA: Diagnosis present

## 2020-12-31 DIAGNOSIS — Z20822 Contact with and (suspected) exposure to covid-19: Secondary | ICD-10-CM | POA: Diagnosis present

## 2020-12-31 DIAGNOSIS — K219 Gastro-esophageal reflux disease without esophagitis: Secondary | ICD-10-CM

## 2020-12-31 DIAGNOSIS — I428 Other cardiomyopathies: Secondary | ICD-10-CM | POA: Diagnosis present

## 2020-12-31 DIAGNOSIS — I495 Sick sinus syndrome: Secondary | ICD-10-CM | POA: Diagnosis present

## 2020-12-31 DIAGNOSIS — Z952 Presence of prosthetic heart valve: Secondary | ICD-10-CM | POA: Diagnosis not present

## 2020-12-31 DIAGNOSIS — I130 Hypertensive heart and chronic kidney disease with heart failure and stage 1 through stage 4 chronic kidney disease, or unspecified chronic kidney disease: Secondary | ICD-10-CM | POA: Diagnosis present

## 2020-12-31 DIAGNOSIS — N1832 Chronic kidney disease, stage 3b: Secondary | ICD-10-CM | POA: Diagnosis present

## 2020-12-31 DIAGNOSIS — I5021 Acute systolic (congestive) heart failure: Secondary | ICD-10-CM | POA: Diagnosis not present

## 2020-12-31 LAB — CBC
HCT: 28.8 % — ABNORMAL LOW (ref 39.0–52.0)
Hemoglobin: 9.1 g/dL — ABNORMAL LOW (ref 13.0–17.0)
MCH: 25.8 pg — ABNORMAL LOW (ref 26.0–34.0)
MCHC: 31.6 g/dL (ref 30.0–36.0)
MCV: 81.6 fL (ref 80.0–100.0)
Platelets: 136 10*3/uL — ABNORMAL LOW (ref 150–400)
RBC: 3.53 MIL/uL — ABNORMAL LOW (ref 4.22–5.81)
RDW: 18.9 % — ABNORMAL HIGH (ref 11.5–15.5)
WBC: 7 10*3/uL (ref 4.0–10.5)
nRBC: 0 % (ref 0.0–0.2)

## 2020-12-31 LAB — COMPREHENSIVE METABOLIC PANEL
ALT: 17 U/L (ref 0–44)
AST: 33 U/L (ref 15–41)
Albumin: 3 g/dL — ABNORMAL LOW (ref 3.5–5.0)
Alkaline Phosphatase: 207 U/L — ABNORMAL HIGH (ref 38–126)
Anion gap: 10 (ref 5–15)
BUN: 59 mg/dL — ABNORMAL HIGH (ref 8–23)
CO2: 24 mmol/L (ref 22–32)
Calcium: 8.9 mg/dL (ref 8.9–10.3)
Chloride: 99 mmol/L (ref 98–111)
Creatinine, Ser: 1.94 mg/dL — ABNORMAL HIGH (ref 0.61–1.24)
GFR, Estimated: 35 mL/min — ABNORMAL LOW (ref >60)
Glucose, Bld: 133 mg/dL — ABNORMAL HIGH (ref 70–99)
Potassium: 3.3 mmol/L — ABNORMAL LOW (ref 3.5–5.1)
Sodium: 133 mmol/L — ABNORMAL LOW (ref 135–145)
Total Bilirubin: 2.3 mg/dL — ABNORMAL HIGH (ref 0.3–1.2)
Total Protein: 7.4 g/dL (ref 6.5–8.1)

## 2020-12-31 LAB — HEMOGLOBIN A1C
Hgb A1c MFr Bld: 9.2 % — ABNORMAL HIGH (ref 4.8–5.6)
Mean Plasma Glucose: 217.34 mg/dL

## 2020-12-31 LAB — MAGNESIUM: Magnesium: 2.6 mg/dL — ABNORMAL HIGH (ref 1.7–2.4)

## 2020-12-31 LAB — RESP PANEL BY RT-PCR (FLU A&B, COVID) ARPGX2
Influenza A by PCR: NEGATIVE
Influenza B by PCR: NEGATIVE
SARS Coronavirus 2 by RT PCR: NEGATIVE

## 2020-12-31 LAB — TROPONIN I (HIGH SENSITIVITY)
Troponin I (High Sensitivity): 63 ng/L — ABNORMAL HIGH (ref <18)
Troponin I (High Sensitivity): 59 ng/L — ABNORMAL HIGH (ref <18)

## 2020-12-31 LAB — GLUCOSE, CAPILLARY
Glucose-Capillary: 157 mg/dL — ABNORMAL HIGH (ref 70–99)
Glucose-Capillary: 169 mg/dL — ABNORMAL HIGH (ref 70–99)

## 2020-12-31 LAB — CBG MONITORING, ED
Glucose-Capillary: 121 mg/dL — ABNORMAL HIGH (ref 70–99)
Glucose-Capillary: 155 mg/dL — ABNORMAL HIGH (ref 70–99)

## 2020-12-31 LAB — PHOSPHORUS: Phosphorus: 3.8 mg/dL (ref 2.5–4.6)

## 2020-12-31 MED ORDER — FUROSEMIDE 10 MG/ML IJ SOLN
100.0000 mg | Freq: Three times a day (TID) | INTRAVENOUS | Status: DC
  Administered 2020-12-31 – 2021-01-01 (×4): 100 mg via INTRAVENOUS
  Filled 2020-12-31 (×7): qty 10

## 2020-12-31 MED ORDER — POTASSIUM CHLORIDE CRYS ER 20 MEQ PO TBCR: 30.0000 meq | EXTENDED_RELEASE_TABLET | Freq: Two times a day (BID) | ORAL | Status: DC

## 2020-12-31 MED ORDER — ACETAMINOPHEN 325 MG PO TABS: 650.0000 mg | ORAL_TABLET | Freq: Four times a day (QID) | ORAL | Status: DC | PRN

## 2020-12-31 MED ORDER — INSULIN GLARGINE-YFGN 100 UNIT/ML ~~LOC~~ SOLN
10.0000 [IU] | Freq: Two times a day (BID) | SUBCUTANEOUS | Status: DC
  Administered 2020-12-31 – 2021-01-02 (×4): 10 [IU] via SUBCUTANEOUS
  Filled 2020-12-31 (×5): qty 0.1

## 2020-12-31 MED ORDER — INSULIN ASPART 100 UNIT/ML IJ SOLN
3.0000 [IU] | Freq: Three times a day (TID) | INTRAMUSCULAR | Status: DC
  Administered 2020-12-31 – 2021-01-02 (×6): 3 [IU] via SUBCUTANEOUS

## 2020-12-31 MED ORDER — ACETAMINOPHEN 650 MG RE SUPP: 650.0000 mg | Freq: Four times a day (QID) | RECTAL | Status: DC | PRN

## 2020-12-31 MED ORDER — FUROSEMIDE 10 MG/ML IJ SOLN
60.0000 mg | Freq: Once | INTRAMUSCULAR | Status: AC
  Administered 2020-12-31: 60 mg via INTRAVENOUS
  Filled 2020-12-31: qty 6

## 2020-12-31 MED ORDER — INSULIN ASPART 100 UNIT/ML IJ SOLN: 0.0000 [IU] | Freq: Three times a day (TID) | INTRAMUSCULAR | Status: DC

## 2020-12-31 MED ORDER — POTASSIUM CHLORIDE CRYS ER 20 MEQ PO TBCR
40.0000 meq | EXTENDED_RELEASE_TABLET | Freq: Two times a day (BID) | ORAL | Status: DC
  Administered 2020-12-31 (×2): 40 meq via ORAL
  Filled 2020-12-31 (×2): qty 2

## 2020-12-31 MED ORDER — FUROSEMIDE 10 MG/ML IJ SOLN
100.0000 mg | Freq: Three times a day (TID) | INTRAVENOUS | Status: DC
  Filled 2020-12-31 (×2): qty 10

## 2020-12-31 MED ORDER — INSULIN GLARGINE-YFGN 100 UNIT/ML ~~LOC~~ SOLN
20.0000 [IU] | Freq: Every day | SUBCUTANEOUS | Status: DC
  Administered 2020-12-31: 20 [IU] via SUBCUTANEOUS
  Filled 2020-12-31: qty 0.2

## 2020-12-31 MED ORDER — FUROSEMIDE 10 MG/ML IJ SOLN: 100.0000 mg | Freq: Two times a day (BID) | INTRAVENOUS | Status: DC

## 2020-12-31 MED ORDER — POTASSIUM CHLORIDE CRYS ER 20 MEQ PO TBCR: 20.0000 meq | EXTENDED_RELEASE_TABLET | Freq: Two times a day (BID) | ORAL | Status: DC

## 2020-12-31 MED ORDER — FUROSEMIDE 10 MG/ML IJ SOLN
40.0000 mg | Freq: Once | INTRAMUSCULAR | Status: AC
  Administered 2020-12-31: 40 mg via INTRAVENOUS
  Filled 2020-12-31: qty 4

## 2020-12-31 MED ORDER — METOLAZONE 2.5 MG PO TABS
2.5000 mg | ORAL_TABLET | Freq: Once | ORAL | Status: AC
  Administered 2020-12-31: 2.5 mg via ORAL
  Filled 2020-12-31: qty 1

## 2020-12-31 MED ORDER — CARVEDILOL 3.125 MG PO TABS
3.1250 mg | ORAL_TABLET | Freq: Two times a day (BID) | ORAL | Status: DC
  Administered 2020-12-31 – 2021-01-02 (×5): 3.125 mg via ORAL
  Filled 2020-12-31 (×5): qty 1

## 2020-12-31 MED ORDER — INSULIN ASPART 100 UNIT/ML IJ SOLN
0.0000 [IU] | Freq: Every day | INTRAMUSCULAR | Status: DC
  Administered 2021-01-01: 2 [IU] via SUBCUTANEOUS

## 2020-12-31 MED ORDER — SPIRONOLACTONE 12.5 MG HALF TABLET
12.5000 mg | ORAL_TABLET | Freq: Every day | ORAL | Status: DC
  Administered 2020-12-31 – 2021-01-01 (×2): 12.5 mg via ORAL
  Filled 2020-12-31 (×3): qty 1

## 2020-12-31 MED ORDER — METOLAZONE 2.5 MG PO TABS
2.5000 mg | ORAL_TABLET | ORAL | Status: DC
  Filled 2020-12-31: qty 1

## 2020-12-31 MED ORDER — ASPIRIN EC 81 MG PO TBEC
81.0000 mg | DELAYED_RELEASE_TABLET | Freq: Every day | ORAL | Status: DC
  Administered 2020-12-31 – 2021-01-02 (×3): 81 mg via ORAL
  Filled 2020-12-31 (×3): qty 1

## 2020-12-31 MED ORDER — INSULIN ASPART 100 UNIT/ML IJ SOLN
0.0000 [IU] | Freq: Three times a day (TID) | INTRAMUSCULAR | Status: DC
  Administered 2020-12-31 (×2): 2 [IU] via SUBCUTANEOUS
  Administered 2021-01-01: 3 [IU] via SUBCUTANEOUS
  Administered 2021-01-01: 2 [IU] via SUBCUTANEOUS
  Administered 2021-01-02: 1 [IU] via SUBCUTANEOUS

## 2020-12-31 MED ORDER — COLCHICINE 0.6 MG PO TABS
0.6000 mg | ORAL_TABLET | Freq: Every day | ORAL | Status: DC | PRN
  Filled 2020-12-31: qty 1

## 2020-12-31 MED ORDER — PANTOPRAZOLE SODIUM 40 MG PO TBEC
40.0000 mg | DELAYED_RELEASE_TABLET | Freq: Two times a day (BID) | ORAL | Status: DC
  Administered 2020-12-31 – 2021-01-02 (×5): 40 mg via ORAL
  Filled 2020-12-31 (×5): qty 1

## 2020-12-31 MED ORDER — POTASSIUM CHLORIDE CRYS ER 10 MEQ PO TBCR: 30.0000 meq | EXTENDED_RELEASE_TABLET | Freq: Two times a day (BID) | ORAL | Status: DC

## 2020-12-31 MED ORDER — ALLOPURINOL 300 MG PO TABS
150.0000 mg | ORAL_TABLET | Freq: Every day | ORAL | Status: DC
  Administered 2020-12-31 – 2021-01-02 (×3): 150 mg via ORAL
  Filled 2020-12-31 (×2): qty 1
  Filled 2020-12-31: qty 2

## 2020-12-31 MED ORDER — INFLUENZA VAC A&B SA ADJ QUAD 0.5 ML IM PRSY
0.5000 mL | PREFILLED_SYRINGE | INTRAMUSCULAR | Status: AC
  Administered 2021-01-01: 0.5 mL via INTRAMUSCULAR
  Filled 2020-12-31: qty 0.5

## 2020-12-31 NOTE — H&P (Signed)
History and Physical    PLEASE NOTE THAT DRAGON DICTATION SOFTWARE WAS USED IN THE CONSTRUCTION OF THIS NOTE.   NORMAL RECINOS PJA:[id number] DOB: 1946-02-22 DOA: 12/30/2020  PCP: [name], MD Patient coming from: home   I have personally briefly reviewed patient's old medical records in [place]  Chief Complaint: sob  HPI: John [name] is a 75 y.o. male with medical history significant for chronic right and left systolic heart failure with an EF nonischemic cardiomyopathy, paroxysmal atrial fibrillation complicated by sick sinus syndrome status post pacemaker placement, severe aortic stenosis status post TAVR in February 2022, stage IIIb chronic kidney disease with baseline creatinine 1.5-1.8, type 2 diabetes mellitus, anemia of chronic kidney disease with baseline hemoglobin 9-10, who is admitted to [facility] on 12/30/2020 with acute on chronic right/left systolic heart failure after presenting from home to [facility] ED complaining of shortness of breath.   The patient reports 3 days of progressive shortness of breath associated with orthopnea, worsening of edema in the bilateral lower extremities as well as a 12 pound weight gain over the last 72 hours per his daily weight monitoring as an outpatient.  Denies any associated chest pain, diaphoresis, palpitations, nausea, vomiting, presyncope, or syncope.  Not associated with any recent cough, wheezing, mopped assist, new lower extremity erythema, or calf tenderness. Denies any recent trauma, travel, surgical procedures, or periods of prolonged diminished ambulatory status. No recent melena or hematochezia. Denies any associated subjective fever, chills, rigors, or generalized myalgias. No recent headache, neck stiffness, rhinitis, rhinorrhea, sore throat, abdominal pain, diarrhea, or rash. No known recent COVID-19 exposures. Denies dysuria, gross hematuria, or change in urinary urgency/frequency.   He has a history of chronic  right and left systolic heart failure, with most recent echocardiogram in March 2022 notable for mildly dilated left ventricular cavity size, LVEF 25 to 30%, global hypokinesis, indeterminate diastolic function, mildly reduced right ventricular systolic function, moderately dilated bilateral atria, moderate mitral regurgitation.  Per chart review, this is felt to be on the basis of nonischemic cardiomyopathy.  The patient conveys good compliance with his outpatient diuretic regimen which includes torsemide 60 mg p.o. twice daily, spironolactone, as well as q. weekly metolazone 2.5 mg.  Denies any recent modifications to this regimen, and notes no recent missed doses thereof.  He also has a documented history of paroxysmal atrial fibrillation complicated by sick sinus syndrome status post pacemaker placement.  Per chart review, he is not formally anticoagulated, but rather is on a daily baby aspirin.   ED Course:  Vital signs in the ED were notable for the following:  - Afebrile, heart rate 68-82; blood pressure 102/65 120/71; respiratory rate 16-26, oxygen saturation 96 to high percent on room air.  Labs were notable for the following: BMP notable for the following: Sodium 131, which corrects to approximately 134 when taking into account concomitant hyperglycemia, potassium 4.2, bicarbonate 23, anion gap 11, creatinine 2.11 compared to 1.83 on 12/12/2020, glucose 235.  BNP 1400 300 compared to 750 on 10/17/2020.  CBC notable for white cell count 6700, hemoglobin 9.6 compared to 9.8 on 12/26/2020, platelets 128.  COVID-19 PCR performed in the ED this evening was found to be negative.  Imaging and additional notable ED work-up: EKG shows ventricular paced rhythm with occasional PVC, ventricular rate 76, no evidence of interval T wave or ST changes.  Chest x-ray shows evidence of cardiomegaly without evidence of acute cardiopulmonary process.  While in the ED, the  following were administered: Lasix 60 mg IV x1.   Subsequently, the patient was admitted for further evaluation and management of acute on chronic right/left systolic heart failure.      Review of Systems: As per HPI otherwise 10 point review of systems negative.   Past Medical History:  Diagnosis Date   Atrial flutter ([place]) 12/2010   Admitted with symptomatic bradycardia (HR 40s), atrial flutter with slow ventricular response 12/2010 + volume overload; AV nodal agents d/c'd and Pradaxa started; RFA in 01/2011   Chronic kidney disease, stage 3b (HCC)    Chronic systolic CHF (congestive heart failure) (HCC)    Class 2 severe obesity due to excess calories with serious comorbidity and body mass index (BMI) of 37.0 to 37.9 in adult ([place]) 07/17/2017   Diabetes mellitus    non insulin dependant   Hematoma, chest wall 05/15/2020   Hyperlipidemia    Hypertension    Mitral regurgitation    NASH (nonalcoholic steatohepatitis)    NICM (nonischemic cardiomyopathy) (HCC)    Osteoarthritis    Polyarticular gout 05/15/2020   Presence of permanent cardiac pacemaker    PSVT (paroxysmal supraventricular tachycardia) (HCC)    Possibly atrial flutter   S/P TAVR (transcatheter aortic valve replacement) 05/03/2020   s/p TAVR with a 29 mm Edwards S3U via the subclavian approach with Dr. Angelena Form & Dr. Cyndia Bent    Severe aortic stenosis     Past Surgical History:  Procedure Laterality Date   ATRIAL FLUTTER ABLATION N/A 02/07/2011   Procedure: ATRIAL FLUTTER ABLATION;  Surgeon: Evans Lance, MD;  Location: [facility] CATH LAB;  Service: Cardiovascular;  Laterality: N/A;   BIV UPGRADE N/A 06/08/2020   Procedure: BIV PPM UPGRADE;  Surgeon: Evans Lance, MD;  Location: [place] CV LAB;  Service: Cardiovascular;  Laterality: N/A;   CARDIAC ELECTROPHYSIOLOGY STUDY AND ABLATION  02/07/2011   CARDIOVERSION N/A 03/23/2016   Procedure: CARDIOVERSION;  Surgeon: Evans Lance, MD;  Location: [place];  Service: Cardiovascular;  Laterality: N/A;    COLONOSCOPY N/A 04/17/2017   [place]: 3 polyps in [facility], diverticulosis   EP IMPLANTABLE DEVICE N/A 08/29/2015   Procedure: Pacemaker Implant;  Surgeon: Evans Lance, MD;  Location: [place] CV LAB;  Service: Cardiovascular;  Laterality: N/A;   EP IMPLANTABLE DEVICE N/A 12/07/2015   Procedure: PPM Lead Revision/Repair;  Surgeon: Evans Lance, MD;  Location: [place] CV LAB;  Service: Cardiovascular;  Laterality: N/A;   ESOPHAGOGASTRODUODENOSCOPY (EGD) WITH PROPOFOL N/A 10/18/2020   Castaneda: normal esophagus, erythematous mucosa in gastric body, non bleeding duodenal ulcer with a non bleeding visible vessel (forrest Class IIa). Injected, clip placed, no specimens collected.   IR ANGIOGRAM SELECTIVE EACH ADDITIONAL VESSEL  10/19/2020   IR ANGIOGRAM SELECTIVE EACH ADDITIONAL VESSEL  10/19/2020   IR ANGIOGRAM VISCERAL SELECTIVE  10/19/2020   IR ANGIOGRAM VISCERAL SELECTIVE  10/19/2020   IR EMBO ART  VEN HEMORR LYMPH EXTRAV  INC GUIDE ROADMAPPING  10/19/2020   IR US GUIDE VASC ACCESS RIGHT  10/19/2020   KNEE ARTHROSCOPY  03/27/2003   left   LUMBAR SPINE SURGERY     "I've had 6 ORs 1972 thru 2004"   POLYPECTOMY  04/17/2017   Procedure: POLYPECTOMY;  Surgeon: Rogene Houston, MD;  [facility];  Service: Endoscopy;;  transverse colon x3;   RIGHT HEART CATH N/A 04/28/2020   Procedure: RIGHT HEART CATH;  Surgeon: Jolaine Artist, MD;  Location: [place] CV LAB;  Service: Cardiovascular;  Laterality: N/A;   RIGHT/LEFT HEART CATH AND CORONARY ANGIOGRAPHY N/A 12/21/2019   Procedure: RIGHT/LEFT HEART CATH AND CORONARY ANGIOGRAPHY;  Surgeon: Nelva Bush, MD;  Location: [place] CV LAB;  Service: Cardiovascular;  Laterality: N/A;   TEE WITHOUT CARDIOVERSION N/A 11/05/2019   Procedure: TRANSESOPHAGEAL ECHOCARDIOGRAM (TEE) WITH PROPOFOL;  Surgeon: Arnoldo Lenis, MD;  [facility];  Service: Endoscopy;  Laterality: N/A;   TEE WITHOUT CARDIOVERSION N/A  05/03/2020   Procedure: TRANSESOPHAGEAL ECHOCARDIOGRAM (TEE);  Surgeon: Burnell Blanks, MD;  Location: [place];  Service: Open Heart Surgery;  Laterality: N/A;    Social History:  reports that he quit smoking about 34 years ago. His smoking use included cigarettes. He has a 10.00 pack-year smoking history. He has never used smokeless tobacco. He reports that he does not currently use alcohol. He reports that he does not use drugs.   No Known Allergies  Family History  Problem Relation Age of Onset   Heart attack Mother    Hypertension Mother    Heart attack Father    Hypertension Father    Heart attack Brother    Colon cancer Neg Hx     Family history reviewed and not pertinent    Prior to Admission medications   Medication Sig Start Date End Date Taking? Authorizing Provider  ACCU-CHEK AVIVA PLUS test strip TEST BLOOD SUGAR TWICE DAILY BEFORE BREAKFAST AND AT BEDTIME 07/19/20   Cassandria Anger, MD  acetaminophen (TYLENOL) 325 MG tablet Take 2 tablets (650 mg total) by mouth every 4 (four) hours as needed for headache or mild pain. 05/15/20   Isaiah Serge, NP  allopurinol (ZYLOPRIM) 300 MG tablet Take 0.5 tablets (150 mg total) by mouth daily. 07/20/20   Bensimhon, Shaune Pascal, MD  aspirin EC 81 MG tablet Take 1 tablet (81 mg total) by mouth daily with breakfast. 10/25/20 10/25/21  Roxan Hockey, MD  benzonatate (TESSALON) 100 MG capsule Take 1 capsule (100 mg total) by mouth 3 (three) times daily as needed for cough. 09/27/20   Evans Lance, MD  camphor-menthol (ANTI-ITCH) lotion Apply 1 application topically daily as needed for itching.    [provider]  carvedilol (COREG) 3.125 MG tablet TAKE 1 TABLET IN THE MORNING AND 2 TABLETS IN THE EVENING 07/20/20   Bensimhon, Shaune Pascal, MD  colchicine 0.6 MG tablet Take 1 tablet (0.6 mg total) by mouth daily as needed. 05/15/20   Isaiah Serge, NP  Dulaglutide (TRULICITY) 1.5 HW/8.0SU SOPN INJECT 1.5MG (1 PEN)  SUBCUTANEOUSLY EVERY WEEK 10/05/20   Cassandria Anger, MD  insulin degludec (TRESIBA FLEXTOUCH) 100 UNIT/ML FlexTouch Pen Inject 40 Units into the skin at bedtime. 11/01/20   Cassandria Anger, MD  Insulin Pen Needle (BD PEN NEEDLE NANO U/F) 32G X 4 MM MISC 1 each by Does not apply route 4 (four) times daily. 12/23/18   Cassandria Anger, MD  loratadine (CLARITIN) 10 MG tablet TAKE 1 TABLET BY MOUTH EVERY DAY 10/03/20   Bensimhon, Shaune Pascal, MD  metolazone (ZAROXOLYN) 2.5 MG tablet Take 1 tablet (2.5 mg total) by mouth once a week. Every Monday 12/12/20   Bensimhon, Shaune Pascal, MD  Multiple Vitamins-Minerals (ONE-A-DAY MENS 50+) TABS Take 1 tablet by mouth daily with breakfast. Men    [provider]  pantoprazole (PROTONIX) 40 MG tablet Take 1 tablet (40 mg total) by mouth 2 (two) times daily before a meal. 10/22/20 10/22/21  Roxan Hockey, MD  potassium chloride SA (  KLOR-CON M20) 20 MEQ tablet Take 1.5 tablets (30 mEq total) by mouth 2 (two) times daily. Take 2 extra tabs on Mon with metolazone 12/12/20   Bensimhon, Shaune Pascal, MD  spironolactone (ALDACTONE) 25 MG tablet Take 0.5 tablets (12.5 mg total) by mouth daily. 07/20/20   Bensimhon, Shaune Pascal, MD  sucralfate (CARAFATE) 1 g tablet Take 1 tablet (1 g total) by mouth 4 (four) times daily. Patient not taking: Reported on 12/27/2020 10/22/20 10/22/21  Roxan Hockey, MD  torsemide (DEMADEX) 20 MG tablet TAKE 3 TABLETS (60 MG TOTAL) BY MOUTH 2 (TWO) TIMES DAILY. 11/01/20   Bensimhon, Shaune Pascal, MD     Objective    Physical Exam: Vitals:   12/31/20 0200 12/31/20 0230 12/31/20 0311 12/31/20 0330  BP: 108/63 102/69 120/71 109/67  Pulse: (!) 35 (!) 59 (!) 57 (!) 45  Resp: 16 18 (!) 21 19  Temp:      TempSrc:      SpO2: 100% 98% 96% 100%  Weight:      Height:        General: appears to be stated age; alert, oriented; mildly increased work of breathing noted Skin: warm, dry, no rash Head:  AT/[place] Mouth:  Oral mucosa membranes  appear moist, normal dentition Neck: supple; trachea midline Heart:  RRR; did not appreciate any M/R/G Lungs: CTAB, did not appreciate any wheezes, rales, or rhonchi Abdomen: + BS; soft, ND, NT Vascular: 2+ pedal pulses b/l; 2+ radial pulses b/l Extremities: 2+ edema in the bilateral lower extremities no muscle wasting Neuro: strength and sensation intact in upper and lower extremities b/l    Labs on Admission: I have personally reviewed following labs and imaging studies  CBC: Recent Labs  Lab 12/26/20 1118 12/30/20 1534  WBC 7.0 6.7  NEUTROABS 5.2 4.4  HGB 9.8* 9.6*  HCT 31.0* 30.7*  MCV 83 83.0  PLT 171 024*   Basic Metabolic Panel: Recent Labs  Lab 12/26/20 1118 12/30/20 1534  NA 133* 131*  K 5.0 4.2  CL 96 97*  CO2 19* 23  GLUCOSE 263* 235*  BUN 54* 62*  CREATININE 2.16* 2.11*  CALCIUM 8.7 8.8*   GFR: Estimated Creatinine Clearance: 37 mL/min (A) (by C-G formula based on SCr of 2.11 mg/dL (H)). Liver Function Tests: No results for input(s): AST, ALT, ALKPHOS, BILITOT, PROT, ALBUMIN in the last 168 hours. No results for input(s): LIPASE, AMYLASE in the last 168 hours. No results for input(s): AMMONIA in the last 168 hours. Coagulation Profile: No results for input(s): INR, PROTIME in the last 168 hours. Cardiac Enzymes: No results for input(s): CKTOTAL, CKMB, CKMBINDEX, TROPONINI in the last 168 hours. BNP (last 3 results) No results for input(s): PROBNP in the last 8760 hours. HbA1C: No results for input(s): HGBA1C in the last 72 hours. CBG: No results for input(s): GLUCAP in the last 168 hours. Lipid Profile: No results for input(s): CHOL, HDL, LDLCALC, TRIG, CHOLHDL, LDLDIRECT in the last 72 hours. Thyroid Function Tests: No results for input(s): TSH, T4TOTAL, FREET4, T3FREE, THYROIDAB in the last 72 hours. Anemia Panel: No results for input(s): VITAMINB12, FOLATE, FERRITIN, TIBC, IRON, RETICCTPCT in the last 72 hours. Urine analysis:    Component  Value Date/Time   COLORURINE YELLOW 05/02/2020 [place] 05/02/2020 1449   LABSPEC 1.005 05/02/2020 1449   PHURINE 7.0 05/02/2020 1449   GLUCOSEU >=500 (A) 05/02/2020 1449   HGBUR NEGATIVE 05/02/2020 [place] 05/02/2020 1449   KETONESUR NEGATIVE  05/02/2020 [place] 05/02/2020 1449   UROBILINOGEN 0.2 01/05/2014 0744   NITRITE NEGATIVE 05/02/2020 1449   LEUKOCYTESUR NEGATIVE 05/02/2020 1449    Radiological Exams on Admission: DG Chest 2 View  Result Date: 12/30/2020 CLINICAL DATA:  Shortness of breath EXAM: CHEST - 2 VIEW COMPARISON:  10/17/2020 FINDINGS: Left-sided pacing device and valve prosthesis as before. Mild cardiomegaly without overt edema. Mild diffuse reticular opacity suggesting underlying chronic disease. No consolidation or pneumothorax. IMPRESSION: No active cardiopulmonary disease.  Mild cardiomegaly. Electronically Signed   By: Donavan Foil M.D.   On: 12/30/2020 16:11     EKG: Independently reviewed, with result as described above.    Assessment/Plan   John [name] is a 75 y.o. male with medical history significant for chronic right and left systolic heart failure with an EF nonischemic cardiomyopathy, paroxysmal atrial fibrillation complicated by sick sinus syndrome status post pacemaker placement, severe aortic stenosis status post TAVR in February 2022, stage IIIb chronic kidney disease with baseline creatinine 1.5-1.8, type 2 diabetes mellitus, anemia of chronic kidney disease with baseline hemoglobin 9-10, who is admitted to [facility] on 12/30/2020 with acute on chronic right/left systolic heart failure after presenting from home to [facility] ED complaining of shortness of breath.    Principal Problem:   Acute on chronic systolic (congestive) heart failure (HCC) Active Problems:   DM type 2 causing vascular disease (HCC)   Paroxysmal atrial fibrillation (HCC)   AKI (acute kidney injury) (HCC)   SOB  (shortness of breath)   GERD (gastroesophageal reflux disease)   History of anemia due to chronic kidney disease     #) Acute on chronic bilateral systolic heart failure: dx of acute decompensation on the basis of presenting 3 days of progressive shortness of breath associated with worsening peripheral edema, objective 12 pound weight gain over that time, increased BNP relative to most recent prior, as further quantified above, and evidence of increased work of breathing. This is in the context of a known history of chronic bilateral systolic heart failure, with most recent echocardiogram performed in March 2022 notable for LV EF 25 to 30%, with evidence of mildly reduced right ventricular systolic function. Patient conveys good compliance with home diuretic therapy, which consists of torsemide, spironolactone, and metolazone, with the latter on a weekly basis, as well as good compliance with additional home cardiac medications, which include Coreg.  Etiology for acutely decompensated heart failure currently unclear. Overall, ACS leading to presenting acutely decompensated heart failure appears less likely at this time in the absence of any recent CP, presenting EKG showing no evidence of acute ischemic changes.  Will trend serial troponin to further assess.   presentation warrants additional IV diuresis, as further detailed below, with close monitoring of ensuing renal function, electrolytes, and volume status, as further noted below.  However, complicating the patient's acute on chronic systolic heart failure is a concomitant presence of right-sided systolic heart failure, with associated element of preload dependence.  Therefore, we will need to closely monitor ensuing volume status so as to not overdiuresis and negatively influence chronic right-sided systolic heart failure as a consequence of iatrogenic hypovolemia.  As the patient is already on a beta-blocker at home, will continue home Coreg.  Of  note, I utilized the Heart Failure order set to assist with my placement of orders on this patient.   Of note, patient received Lasix 60 mg IV x1 in the ED this evening in terms of rationale behind  dosing of IV diuresis intervention during this hospitalization: The patient's home torsemide 60 mg p.o. twice daily is equivalent to Lasix 20 mg IV twice daily, or 240 mg of IV Lasix over the 24-hour period.  Consequently, in order to pursue the equivalent dose of IV Lasix that will represent an escalation relative to his home torsemide dosing, we will pursue Lasix 100 mg IV 3 times daily.  We will also attempt to make use of synergistic effect of combining metolazone with loop diuretic.  Specifically, will order metolazone 2.5 mg p.o. x1 dose now, with Lasix 40 mg IV x1 to be administered 30 minutes following administration of metolazone to maximize the synergistic impact of these 2 medications.     Plan: monitor strict I's & O's and daily weights. Monitor on telemetry, including trend in HR in response to diuresis, as above. Monitor continuous pulse oximetry. Repeat BMP in the morning, including for monitoring trend of potassium, bicarbonate, and renal function in response to interval diuresis efforts. Add-on serum magnesium level, and repeat this level in the AM.  Potassium chloride 20 mill equivalents p.o. twice daily.  Close monitoring of ensuing blood pressure response to diuresis efforts, including to help guide need for improvement in afterload reduction in order to optimize cardiac output.  Continue home Coreg, as above.  Lasix 100 mg IV 3 times daily, with rationale described above, in addition to diasone 2.5 mg p.o. x1 dose now.  Trend serial troponin.  Echocardiogram ordered for the morning.  Hold home torsemide for now.  Hold home spironolactone for now.     #) acute kidney injury superimposed on stage IIIb chronic kidney disease: In the context of the documented history of CKD 3B with baseline  creatinine 1.5-1.8, presenting creatinine noted to be 2.11 relative to 1.3 on 12/02/2020.  Suspect that this is prerenal in nature on the basis of diminished renal perfusion gradient as a consequence of acute on chronic systolic heart failure, as further detailed above.  We will pursue IV diuresis, as further detailed above, will closely monitoring ensuing renal function.  Plan: IV diuresis, as further detailed above.  Monitor strict I's and O's and daily weights.  Check urinalysis with microscopy to evaluate for presence of urinary casts.  Add on random urine sodium as well as random urine creatinine.  Repeat CMP in the morning.  Add on serum magnesium level.       #) Paroxysmal atrial fibrillation: Documented history of such. In the setting of a CHA2DS2-VASc score of 6, there is an indication for the patient to be on chronic anticoagulation for thromboembolic prophylaxis.  Patient is not currently anticoagulated, but rather is on a daily baby aspirin.  Rationale for this not completely clear to me at this time, will attempt additional chart review to ascertain rationale.  Home AV nodal blocking regimen: Coreg.  Associated with history of sick sinus syndrome status post pacemaker placement.   Plan: monitor strict I's & O's and daily weights. Repeat BMP and CBC in the morning. Check serum magnesium level. Continue home AV nodal blocking regimen, as above.  Monitor on telemetry.      #) Type 2 diabetes mellitus: Documented history of such: On Tresiba 40 units subcu nightly as well as Trulicity.  Presenting blood sugar noted to be 235.  We will resume approximately half dose of his outpatient basal insulin, as further quantified below.  Plan: Lantus 20 units subcu nightly, first dose now.  Hold home Trulicity for now.  Accu-Cheks  before every meal and at bedtime with low-dose high scale insulin.      #) Allergic rhinitis: On loratadine as an outpatient.  In the setting of presenting acute on  chronic bilateral systolic heart failure,, with plan for continuation of home beta-blockade, will hold home loratadine for now so as to avoid any potential exacerbating anticholinergic implications of this medication.  Plan: Hold home loratadine for now, as above.      #) GERD: On Protonix as an outpatient.  Plan: Continue PPI.      #) Anemia of chronic kidney disease: Associated baseline hemoglobin range of 9-10, with presenting labs reflecting hemoglobin consistent with this range.  No evidence of active bleed at this time.  Plan: Repeat CBC in the morning.  Further evaluation and management of presenting acute kidney injury superimposed on CKD 3B.      DVT prophylaxis: SCDs Code Status: Full code Family Communication: none Disposition Plan: Per Rounding Team Consults called: none  Admission status: Inpatient   Of note, this patient was added by me to the following Admit List/Treatment Team:  mcadmits.   Of note, the Adult Admission Order Set (Multimorbid order set) was used by me in the admission process for this patient.  PLEASE NOTE THAT DRAGON DICTATION SOFTWARE WAS USED IN THE CONSTRUCTION OF THIS NOTE.   Rhetta Mura DO [facility] Pager [phone] From [place]   12/31/2020, 4:53 AM

## 2020-12-31 NOTE — ED Notes (Signed)
Checked patient cbg it was 155 notified the RN of blood sugar

## 2020-12-31 NOTE — ED Provider Notes (Signed)
[name] Provider Note  CSN: [id number] Arrival date & time: 12/30/20 1506  Chief Complaint(s) Shortness of Breath  HPI [name] is a 75 y.o. male with a past medical history listed below including nonischemic cardiomyopathy with a history of heart failure with a last EF of 25 to 30%, atrial fibrillation/flutter status post pacemaker, history of aortic stenosis status post TAVR.  Patient is on torsemide and metolazone for heart failure. Presented for 3 days of worsening dyspnea on exertion, lower extremity and abdominal edema as well as a 12 to 15 pound weight gain in that timeframe. No associated chest pain. No recent fevers or infections. No coughing or congestion. No abdominal pain. Patient's wife reached out to Dr. Clayborne Dana office who recommended presented for evaluation. Patient has been compliant with his medications.    Shortness of Breath  Past Medical History Past Medical History:  Diagnosis Date   Atrial flutter ([place]) 12/2010   Admitted with symptomatic bradycardia (HR 40s), atrial flutter with slow ventricular response 12/2010 + volume overload; AV nodal agents d/c'd and Pradaxa started; RFA in 01/2011   Chronic kidney disease, stage 3b (HCC)    Chronic systolic CHF (congestive heart failure) (HCC)    Class 2 severe obesity due to excess calories with serious comorbidity and body mass index (BMI) of 37.0 to 37.9 in adult ([place]) 07/17/2017   Diabetes mellitus    non insulin dependant   Hematoma, chest wall 05/15/2020   Hyperlipidemia    Hypertension    Mitral regurgitation    NASH (nonalcoholic steatohepatitis)    NICM (nonischemic cardiomyopathy) (HCC)    Osteoarthritis    Polyarticular gout 05/15/2020   Presence of permanent cardiac pacemaker    PSVT (paroxysmal supraventricular tachycardia) (HCC)    Possibly atrial flutter   S/P TAVR (transcatheter aortic valve replacement) 05/03/2020   s/p TAVR with a 29  mm Edwards S3U via the subclavian approach with Dr. Angelena Form & Dr. Cyndia Bent    Severe aortic stenosis    Patient Active Problem List   Diagnosis Date Noted   Acute on chronic systolic (congestive) heart failure ([place]) 12/31/2020   Duodenal ulcer 11/22/2020   Iron deficiency anemia due to chronic blood loss 11/22/2020   Itching 11/22/2020   Acute GI bleeding 10/19/2020   GIB (gastrointestinal bleeding) 10/17/2020   Anemia    Hematoma, chest wall 05/15/2020   Polyarticular gout 05/15/2020   S/P TAVR (transcatheter aortic valve replacement) 05/03/2020   Severe aortic stenosis 04/25/2020   Chronic atrial fibrillation ([place]) 04/25/2020   Acute on chronic systolic CHF (congestive heart failure) ([place]) 04/25/2020   Permanent atrial fibrillation ([place]) 03/10/2020   Calculus of gallbladder without cholecystitis without obstruction    Thrombocytopenia ([place]) 03/09/2020   Hypoalbuminemia 03/09/2020   Transaminitis 03/09/2020   NASH (nonalcoholic steatohepatitis) 03/03/2020   Elevated LFTs 03/03/2020   Lower leg edema 10/14/2019   Class 2 obesity    Hyponatremia 11/11/2018   CKD (chronic kidney disease) stage 3, GFR 30-59 ml/min (HCC)    Paroxysmal atrial fibrillation ([place])    Pacemaker 03/02/2018   Atrial pacemaker lead displacement 12/07/2015   NSVT (nonsustained ventricular tachycardia) 08/28/2015   Bradycardia 08/25/2015   Current use of long term anticoagulation 05/04/2011   Atrial flutter ([place]) 01/05/2011   DM type 2 causing vascular disease ([place]) 12/23/2009   Mixed hyperlipidemia 12/23/2009   Essential hypertension, benign 12/23/2009   Home Medication(s) Prior to Admission medications   Medication Sig Start Date End Date  Taking? Authorizing Provider  ACCU-CHEK AVIVA PLUS test strip TEST BLOOD SUGAR TWICE DAILY BEFORE BREAKFAST AND AT BEDTIME 07/19/20   Cassandria Anger, MD  acetaminophen (TYLENOL) 325 MG tablet Take 2 tablets (650 mg total) by mouth every 4 (four) hours as needed  for headache or mild pain. 05/15/20   Isaiah Serge, NP  allopurinol (ZYLOPRIM) 300 MG tablet Take 0.5 tablets (150 mg total) by mouth daily. 07/20/20   Bensimhon, Shaune Pascal, MD  aspirin EC 81 MG tablet Take 1 tablet (81 mg total) by mouth daily with breakfast. 10/25/20 10/25/21  Roxan Hockey, MD  benzonatate (TESSALON) 100 MG capsule Take 1 capsule (100 mg total) by mouth 3 (three) times daily as needed for cough. 09/27/20   Evans Lance, MD  camphor-menthol (ANTI-ITCH) lotion Apply 1 application topically daily as needed for itching.    [provider]  carvedilol (COREG) 3.125 MG tablet TAKE 1 TABLET IN THE MORNING AND 2 TABLETS IN THE EVENING 07/20/20   Bensimhon, Shaune Pascal, MD  colchicine 0.6 MG tablet Take 1 tablet (0.6 mg total) by mouth daily as needed. 05/15/20   Isaiah Serge, NP  Dulaglutide (TRULICITY) 1.5 UG/8.9VQ SOPN INJECT 1.5MG (1 PEN) SUBCUTANEOUSLY EVERY WEEK 10/05/20   Cassandria Anger, MD  insulin degludec (TRESIBA FLEXTOUCH) 100 UNIT/ML FlexTouch Pen Inject 40 Units into the skin at bedtime. 11/01/20   Cassandria Anger, MD  Insulin Pen Needle (BD PEN NEEDLE NANO U/F) 32G X 4 MM MISC 1 each by Does not apply route 4 (four) times daily. 12/23/18   Cassandria Anger, MD  loratadine (CLARITIN) 10 MG tablet TAKE 1 TABLET BY MOUTH EVERY DAY 10/03/20   Bensimhon, Shaune Pascal, MD  metolazone (ZAROXOLYN) 2.5 MG tablet Take 1 tablet (2.5 mg total) by mouth once a week. Every Monday 12/12/20   Bensimhon, Shaune Pascal, MD  Multiple Vitamins-Minerals (ONE-A-DAY MENS 50+) TABS Take 1 tablet by mouth daily with breakfast. Men    [provider]  pantoprazole (PROTONIX) 40 MG tablet Take 1 tablet (40 mg total) by mouth 2 (two) times daily before a meal. 10/22/20 10/22/21  Emokpae, Courage, MD  potassium chloride SA (KLOR-CON M20) 20 MEQ tablet Take 1.5 tablets (30 mEq total) by mouth 2 (two) times daily. Take 2 extra tabs on Mon with metolazone 12/12/20   Bensimhon, Shaune Pascal, MD   spironolactone (ALDACTONE) 25 MG tablet Take 0.5 tablets (12.5 mg total) by mouth daily. 07/20/20   Bensimhon, Shaune Pascal, MD  sucralfate (CARAFATE) 1 g tablet Take 1 tablet (1 g total) by mouth 4 (four) times daily. Patient not taking: Reported on 12/27/2020 10/22/20 10/22/21  Roxan Hockey, MD  torsemide (DEMADEX) 20 MG tablet TAKE 3 TABLETS (60 MG TOTAL) BY MOUTH 2 (TWO) TIMES DAILY. 11/01/20   Bensimhon, Shaune Pascal, MD  Past Surgical History Past Surgical History:  Procedure Laterality Date   ATRIAL FLUTTER ABLATION N/A 02/07/2011   Procedure: ATRIAL FLUTTER ABLATION;  Surgeon: Evans Lance, MD;  Location: [facility] C[facility] LAB;  Service: Cardiovascular;  Laterality: N/A;   BIV UPGRADE N/A 06/08/2020   Procedure: BIV PPM UPGRADE;  Surgeon: Evans Lance, MD;  Location: [place] CV LAB;  Service: Cardiovascular;  Laterality: N/A;   CARDIAC ELECTROPHYSIOLOGY STUDY AND ABLATION  02/07/2011   CARDIOVERSION N/A 03/23/2016   Procedure: CARDIOVERSION;  Surgeon: Evans Lance, MD;  Location: [place];  Service: Cardiovascular;  Laterality: N/A;   COLONOSCOPY N/A 04/17/2017   Rehman: 3 polyps in [facility], diverticulosis   EP IMPLANTABLE DEVICE N/A 08/29/2015   Procedure: Pacemaker Implant;  Surgeon: Evans Lance, MD;  Location: [facility] CV LAB;  Service: Cardiovascular;  Laterality: N/A;   EP IMPLANTABLE DEVICE N/A 12/07/2015   Procedure: PPM Lead Revision/Repair;  Surgeon: Evans Lance, MD;  Location: [place] CV LAB;  Service: Cardiovascular;  Laterality: N/A;   ESOPHAGOGASTRODUODENOSCOPY (EGD) WITH PROPOFOL N/A 10/18/2020   Castaneda: normal esophagus, erythematous mucosa in gastric body, non bleeding duodenal ulcer with a non bleeding visible vessel (forrest Class IIa). Injected, clip placed, no specimens collected.   IR ANGIOGRAM SELECTIVE EACH ADDITIONAL VESSEL   10/19/2020   IR ANGIOGRAM SELECTIVE EACH ADDITIONAL VESSEL  10/19/2020   IR ANGIOGRAM VISCERAL SELECTIVE  10/19/2020   IR ANGIOGRAM VISCERAL SELECTIVE  10/19/2020   IR EMBO ART  VEN HEMORR LYMPH EXTRAV  INC GUIDE ROADMAPPING  10/19/2020   IR US GUIDE VASC ACCESS RIGHT  10/19/2020   KNEE ARTHROSCOPY  03/27/2003   left   LUMBAR SPINE SURGERY     "I've had 6 ORs 1972 thru 2004"   POLYPECTOMY  04/17/2017   Procedure: POLYPECTOMY;  Surgeon: Rogene Houston, MD;  [facility];  Service: Endoscopy;;  transverse colon x3;   RIGHT HEART CATH N/A 04/28/2020   Procedure: RIGHT HEART CATH;  Surgeon: Jolaine Artist, MD;  Location: [place] [facility];  Service: Cardiovascular;  Laterality: N/A;   RIGHT/LEFT HEART CATH AND CORONARY ANGIOGRAPHY N/A 12/21/2019   Procedure: RIGHT/LEFT HEART CATH AND CORONARY ANGIOGRAPHY;  Surgeon: Nelva Bush, MD;  Location: [place] CV LAB;  Service: Cardiovascular;  Laterality: N/A;   TEE WITHOUT CARDIOVERSION N/A 11/05/2019   Procedure: TRANSESOPHAGEAL ECHOCARDIOGRAM (TEE) WITH PROPOFOL;  Surgeon: Arnoldo Lenis, MD;  [facility];  Service: Endoscopy;  Laterality: N/A;   TEE WITHOUT CARDIOVERSION N/A 05/03/2020   Procedure: TRANSESOPHAGEAL ECHOCARDIOGRAM (TEE);  Surgeon: Burnell Blanks, MD;  Location: [place] [facility];  Service: Open Heart Surgery;  Laterality: N/A;   Family History Family History  Problem Relation Age of Onset   Heart attack Mother    Hypertension Mother    Heart attack Father    Hypertension Father    Heart attack Brother    Colon cancer Neg Hx     Social History Social History   Tobacco Use   Smoking status: Former    Packs/day: 1.00    Years: 10.00    Pack years: 10.00    Types: Cigarettes    Quit date: 03/26/1986    Years since quitting: 34.7   Smokeless tobacco: Never   Tobacco comments:    "stopped cigarette  smoking 1988"  Vaping Use   Vaping Use: Never used  Substance Use Topics    Alcohol use: Not Currently    Alcohol/week: 0.0 standard  drinks    Comment: "quit alcohol ~ 2007"   Drug use: No   Allergies Patient has no known allergies.  Review of Systems Review of Systems  Respiratory:  Positive for shortness of breath.   All other systems are reviewed and are negative for acute change except as noted in the HPI  Physical Exam Vital Signs  I have reviewed the triage vital signs BP 109/67   Pulse (!) 45   Temp 98.5 F (36.9 C) (Oral)   Resp 19   Ht 6' (1.829 m)   Wt 99.8 kg   SpO2 100%   BMI 29.84 kg/m   Physical Exam Vitals reviewed.  Constitutional:      General: He is not in acute distress.    Appearance: He is well-developed. He is not diaphoretic.  HENT:     Head: Normocephalic and atraumatic.     Nose: Nose normal.  Eyes:     General: No scleral icterus.       Right eye: No discharge.        Left eye: No discharge.     Conjunctiva/sclera: Conjunctivae normal.     Pupils: Pupils are equal, round, and reactive to light.  Neck:     Vascular: Hepatojugular reflux and JVD present.  Cardiovascular:     Rate and Rhythm: Normal rate and regular rhythm.     Heart sounds: No murmur heard.   No friction rub. No gallop.  Pulmonary:     Effort: Pulmonary effort is normal. No respiratory distress.     Breath sounds: Normal breath sounds. No stridor. No rales.  Abdominal:     General: There is no distension.     Palpations: Abdomen is soft.     Tenderness: There is no abdominal tenderness.     Comments: Abd wall edema  Musculoskeletal:        General: No tenderness.     Cervical back: Normal range of motion and neck supple.     Right lower leg: 2+ Pitting Edema present.     Left lower leg: 2+ Pitting Edema present.  Skin:    General: Skin is warm and dry.     Findings: No erythema or rash.  Neurological:     Mental Status: He is alert and oriented to person, place, and time.    ED Results and Treatments Labs (all labs ordered are  listed, but only abnormal results are displayed) Labs Reviewed  BASIC METABOLIC PANEL - Abnormal; Notable for the following components:      Result Value   Sodium 131 (*)    Chloride 97 (*)    Glucose, Bld 235 (*)    BUN 62 (*)    Creatinine, Ser 2.11 (*)    Calcium 8.8 (*)    GFR, Estimated 32 (*)    All other components within normal limits  CBC WITH DIFFERENTIAL/PLATELET - Abnormal; Notable for the following components:   RBC 3.70 (*)    Hemoglobin 9.6 (*)    HCT 30.7 (*)    MCH 25.9 (*)    RDW 19.1 (*)    Platelets 128 (*)    All other components within normal limits  BRAIN NATRIURETIC PEPTIDE - Abnormal; Notable for the following components:   B Natriuretic Peptide 1,430.2 (*)    All other components within normal limits  RESP PANEL BY RT-PCR (FLU A&B, COVID) [id number]  EKG  EKG Interpretation  Date/Time:  Friday December 30 2020 15:24:30 EDT Ventricular Rate:  76 PR Interval:    QRS Duration: 190 QT Interval:  504 QTC Calculation: 567 R Axis:   235 Text Interpretation: Ventricular-paced rhythm with occasional Premature ventricular complexes Abnormal ECG Confirmed by Addison Lank [phone]) on 12/30/2020 11:20:14 PM       Radiology DG Chest 2 View  Result Date: 12/30/2020 CLINICAL DATA:  Shortness of breath EXAM: CHEST - 2 VIEW COMPARISON:  10/17/2020 FINDINGS: Left-sided pacing device and valve prosthesis as before. Mild cardiomegaly without overt edema. Mild diffuse reticular opacity suggesting underlying chronic disease. No consolidation or pneumothorax. IMPRESSION: No active cardiopulmonary disease.  Mild cardiomegaly. Electronically Signed   By: Donavan Foil M.D.   On: 12/30/2020 16:11    Pertinent labs & imaging results that were available during my care of the patient were reviewed by me and considered in my medical decision making (see MDM for  details).  Medications Ordered in ED Medications  furosemide (LASIX) injection 60 mg (60 mg Intravenous Given 12/31/20 0249)                                                                                                                                     Procedures Procedures  (including critical care time)  Medical Decision Making / ED Course I have reviewed the nursing notes for this encounter and the patient's prior records (if available in EHR or on provided paperwork).  [name] was evaluated in Emergency Department on 12/31/2020 for the symptoms described in the history of present illness. He was evaluated in the context of the global COVID-19 pandemic, which necessitated consideration that the patient might be at risk for infection with the SARS-CoV-2 virus that causes COVID-19. Institutional protocols and algorithms that pertain to the evaluation of patients at risk for COVID-19 are in a state of rapid change based on information released by regulatory bodies including the CDC and federal and state organizations. These policies and algorithms were followed during the patient's care in the ED.     Patient presentation is consistent with CHF exacerbation. Chest x-ray without evidence of pulmonary edema but did reveal mild cardiomegaly. No supplemental oxygen requirement at rest.  Given patient's significant weight gain, and dyspnea on exertion. Will admit for IV diuresing.   Pertinent labs & imaging results that were available during my care of the patient were reviewed by me and considered in my medical decision making:  Patient admitted to hospitalist service for further management  Final Clinical Impression(s) / ED Diagnoses Final diagnoses:  Acute on chronic systolic congestive heart failure ([place])     This chart was dictated using voice recognition software.  Despite best efforts to proofread,  errors can occur which can change the documentation meaning.     Fatima Blank, MD 12/31/20 [phone]

## 2020-12-31 NOTE — Progress Notes (Signed)
[facility] PROGRESS NOTE    Progress Note  John [name]  CBJ:[id number] DOB: [birth year] DOA: 12/30/2020 PCP: [name], MD     Brief Narrative:   SAABIR BLYTH is an 75 y.o. male past medical history significant for biventricular failure nonischemic last 2D echo that showed an EF of 25% with global hypokinesia and elevated pulmonary pressures paroxysmal atrial fibrillation/sick sinus syndrome status post pacer, not on anticoagulation as he was found to have an EGD duodenal ulcer with a visible vessel so Eliquis was held in July 2022, during this time there is a cannot be clips and underwent IR embolization., severe aortic stenosis status post TAVR's in February 2022, chronic kidney disease stage IIIb, with a baseline creatinine of 1.5-1.8, diabetes mellitus who comes into the hospital for shortness of breath and was found to be in acute decompensated heart failure with lower extremity edema and a 12 pound weight gain over the last 72 hours.   Assessment/Plan:   Acute on chronic systolic (congestive) heart failure [facility]): Estimated dry weight around 9392 kg on admission 100 kg.  With an increased BNP of 1500 appears volume overloaded on physical exam.  Acute kidney injury superimposed on chronic kidney disease stage IIIb/cardiorenal syndrome: With a baseline creatinine 1.5-1.8 on admission 2.1 likely due to volume overload. Creatinine is improving with diuresis continue to monitor strict I's and O's restrict his fluid diet. Magnesium is 2.6.  Hypokalemia: Likely due to diuresis: Replete orally more aggressively repeat the basic metabolic panel in the morning there is likely due to IV diuresis.  Paroxysmal atrial fibrillation/sick sinus syndrome: With a chads Vascor of greater than 6, not on anticoagulation due to recent GI bleed with a visible vessel not able to be clipped and had to be embolized. He is currently rate controlled with a pacer in place on Coreg.  DM type  2 causing vascular disease ([place]): Continue long-acting insulin will split into twice daily, continue CBGs before meals and at bedtime sliding scale insulin.  Allergic rhinitis: On loratidine.  DVT prophylaxis: scd Family Communication:none Status is: Inpatient  Remains inpatient appropriate because:Hemodynamically unstable  Dispo: The patient is from: Home              Anticipated d/c is to: Home              Patient currently is not medically stable to d/c.   Difficult to place patient No   Code Status:     Code Status Orders  (From admission, onward)           Start     Ordered   12/31/20 0453  Full code  Continuous        12/31/20 0452           Code Status History     Date Active Date Inactive Code Status Order ID Comments User Context   10/17/2020 1258 10/22/2020 2009 Full Code [id number]  [name], MD ED   06/08/2020 1308 06/08/2020 2043 Full Code [id number]  Evans Lance, MD Inpatient   04/25/2020 1825 05/15/2020 2226 Full Code [id number]  Bensimhon, Shaune Pascal, MD Inpatient   03/10/2020 0747 03/14/2020 1516 DNR [id number]  Orson Eva, MD ED   03/09/2020 1943 03/10/2020 0747 Full Code [id number]  John Hoit, DO ED   12/21/2019 0841 12/21/2019 1622 Full Code [id number]  Nelva Bush, MD Inpatient   11/11/2018 2125 11/17/2018 1821 Full Code [id number]  Vianne Bulls, MD Inpatient   08/19/2018 1943  08/22/2018 1852 Full Code [id number]  Tommie Raymond, NP Inpatient   03/02/2018 0734 03/03/2018 1502 Full Code [id number]  Lady Deutscher, MD ED   12/07/2015 1758 12/08/2015 1657 Full Code [id number]  Evans Lance, MD Inpatient   11/20/2015 1929 11/23/2015 2010 Full Code [id number]  Vianne Bulls, MD Inpatient   08/25/2015 1708 08/30/2015 1656 Full Code [id number]  Isaac Bliss, Rayford Halsted, MD Inpatient         IV Access:   Peripheral IV   Procedures and diagnostic studies:   DG Chest 2 View  Result Date: 12/30/2020 CLINICAL DATA:  Shortness of  breath EXAM: CHEST - 2 VIEW COMPARISON:  10/17/2020 FINDINGS: Left-sided pacing device and valve prosthesis as before. Mild cardiomegaly without overt edema. Mild diffuse reticular opacity suggesting underlying chronic disease. No consolidation or pneumothorax. IMPRESSION: No active cardiopulmonary disease.  Mild cardiomegaly. Electronically Signed   By: Donavan Foil M.D.   On: 12/30/2020 16:11     Medical Consultants:   None.   Subjective:    [name] he relates his breathing is improved compared to yesterday.  Objective:    Vitals:   12/31/20 0230 12/31/20 0311 12/31/20 0330 12/31/20 0515  BP: 102/69 120/71 109/67   Pulse: (!) 59 (!) 57 (!) 45 60  Resp: 18 (!) 21 19 14   Temp:      TempSrc:      SpO2: 98% 96% 100% 97%  Weight:      Height:       SpO2: 97 %   Intake/Output Summary (Last 24 hours) at 12/31/2020 0704 Last data filed at 12/31/2020 0501 Gross per 24 hour  Intake --  Output 700 ml  Net -700 ml   Filed Weights   12/30/20 1534  Weight: 99.8 kg    Exam: General exam: In no acute distress. Respiratory system: Good air movement and clear to auscultation. Cardiovascular system: S1 & S2 heard, RRR. No JVD. Gastrointestinal system: Abdomen is nondistended, soft and nontender.  Extremities: No pedal edema. Skin: No rashes, lesions or ulcers Psychiatry: Judgement and insight appear normal. Mood & affect appropriate.    Data Reviewed:    Labs: Basic Metabolic Panel: Recent Labs  Lab 12/26/20 1118 12/30/20 1534 12/31/20 0508  NA 133* 131* 133*  K 5.0 4.2 3.3*  CL 96 97* 99  CO2 19* 23 24  GLUCOSE 263* 235* 133*  BUN 54* 62* 59*  CREATININE 2.16* 2.11* 1.94*  CALCIUM 8.7 8.8* 8.9  MG  --   --  2.6*  PHOS  --   --  3.8   GFR Estimated Creatinine Clearance: 40.3 mL/min (A) (by C-G formula based on SCr of 1.94 mg/dL (H)). Liver Function Tests: Recent Labs  Lab 12/31/20 0508  AST 33  ALT 17  ALKPHOS 207*  BILITOT 2.3*  PROT 7.4   ALBUMIN 3.0*   No results for input(s): LIPASE, AMYLASE in the last 168 hours. No results for input(s): AMMONIA in the last 168 hours. Coagulation profile No results for input(s): INR, PROTIME in the last 168 hours. COVID-19 Labs  No results for input(s): DDIMER, FERRITIN, LDH, CRP in the last 72 hours.  Lab Results  Component Value Date   SARSCOV2NAA NEGATIVE 12/31/2020   SARSCOV2NAA NEGATIVE 10/17/2020   [place] NEGATIVE 06/06/2020   [place] NEGATIVE 04/25/2020    CBC: Recent Labs  Lab 12/26/20 1118 12/30/20 1534 12/31/20 0508  WBC 7.0 6.7 7.0  NEUTROABS 5.2 4.4  --   HGB 9.8*  9.6* 9.1*  HCT 31.0* 30.7* 28.8*  MCV 83 83.0 81.6  PLT 171 128* 136*   Cardiac Enzymes: No results for input(s): CKTOTAL, CKMB, CKMBINDEX, TROPONINI in the last 168 hours. BNP (last 3 results) No results for input(s): PROBNP in the last 8760 hours. CBG: No results for input(s): GLUCAP in the last 168 hours. D-Dimer: No results for input(s): DDIMER in the last 72 hours. Hgb A1c: No results for input(s): HGBA1C in the last 72 hours. Lipid Profile: No results for input(s): CHOL, HDL, LDLCALC, TRIG, CHOLHDL, LDLDIRECT in the last 72 hours. Thyroid function studies: No results for input(s): TSH, T4TOTAL, T3FREE, THYROIDAB in the last 72 hours.  Invalid input(s): FREET3 Anemia work up: No results for input(s): VITAMINB12, FOLATE, FERRITIN, TIBC, IRON, RETICCTPCT in the last 72 hours. Sepsis Labs: Recent Labs  Lab 12/26/20 1118 12/30/20 1534 12/31/20 0508  WBC 7.0 6.7 7.0   Microbiology Recent Results (from the past 240 hour(s))  Resp Panel by RT-PCR (Flu A&B, Covid) Nasopharyngeal Swab     Status: None   Collection Time: 12/31/20  2:29 AM   Specimen: Nasopharyngeal Swab; Nasopharyngeal(NP) swabs in vial transport medium  Result Value Ref Range Status   SARS Coronavirus 2 by RT PCR NEGATIVE NEGATIVE Final    Comment: (NOTE) SARS-CoV-2 target nucleic acids are NOT  DETECTED.  The SARS-CoV-2 RNA is generally detectable in upper respiratory specimens during the acute phase of infection. The lowest concentration of SARS-CoV-2 viral copies this assay can detect is 138 copies/mL. A negative result does not preclude SARS-Cov-2 infection and should not be used as the sole basis for treatment or other patient management decisions. A negative result may occur with  improper specimen collection/handling, submission of specimen other than nasopharyngeal swab, presence of viral mutation(s) within the areas targeted by this assay, and inadequate number of viral copies(<138 copies/mL). A negative result must be combined with clinical observations, patient history, and epidemiological information. The expected result is Negative.  Fact Sheet for Patients:  EntrepreneurPulse.com.au  Fact Sheet for Healthcare Providers:  IncredibleEmployment.be  This test is no t yet approved or cleared by the [place] FDA and  has been authorized for detection and/or diagnosis of SARS-CoV-2 by FDA under an Emergency Use Authorization (EUA). This EUA will remain  in effect (meaning this test can be used) for the duration of the COVID-19 declaration under Section 564(b)(1) of the Act, 21 U.S.C.section 360bbb-3(b)(1), unless the authorization is terminated  or revoked sooner.       Influenza A by PCR NEGATIVE NEGATIVE Final   Influenza B by PCR NEGATIVE NEGATIVE Final    Comment: (NOTE) The Xpert Xpress SARS-CoV-2/FLU/RSV plus assay is intended as an aid in the diagnosis of influenza from Nasopharyngeal swab specimens and should not be used as a sole basis for treatment. Nasal washings and aspirates are unacceptable for Xpert Xpress SARS-CoV-2/FLU/RSV testing.  Fact Sheet for Patients: EntrepreneurPulse.com.au  Fact Sheet for Healthcare Providers: IncredibleEmployment.be  This test is not yet  approved or cleared by the [place] FDA and has been authorized for detection and/or diagnosis of SARS-CoV-2 by FDA under an Emergency Use Authorization (EUA). This EUA will remain in effect (meaning this test can be used) for the duration of the COVID-19 declaration under Section 564(b)(1) of the Act, 21 U.S.C. section 360bbb-3(b)(1), unless the authorization is terminated or revoked.  Performed at [facility], [place] [address] [place], [place] [address]      Medications:    aspirin  EC  81 mg Oral Daily   carvedilol  3.125 mg Oral BID WC   insulin aspart  0-9 Units Subcutaneous TID WC   insulin glargine-yfgn  20 Units Subcutaneous QHS   pantoprazole  40 mg Oral BID AC   potassium chloride  20 mEq Oral BID   Continuous Infusions:  furosemide        LOS: 0 days   Charlynne Cousins  [facility]  12/31/2020, 7:04 AM

## 2021-01-01 ENCOUNTER — Inpatient Hospital Stay (HOSPITAL_COMMUNITY): Payer: Medicare HMO

## 2021-01-01 DIAGNOSIS — I5021 Acute systolic (congestive) heart failure: Secondary | ICD-10-CM

## 2021-01-01 DIAGNOSIS — E1159 Type 2 diabetes mellitus with other circulatory complications: Secondary | ICD-10-CM | POA: Diagnosis not present

## 2021-01-01 DIAGNOSIS — I480 Paroxysmal atrial fibrillation: Secondary | ICD-10-CM | POA: Diagnosis not present

## 2021-01-01 DIAGNOSIS — I5023 Acute on chronic systolic (congestive) heart failure: Secondary | ICD-10-CM | POA: Diagnosis not present

## 2021-01-01 DIAGNOSIS — N179 Acute kidney failure, unspecified: Secondary | ICD-10-CM | POA: Diagnosis not present

## 2021-01-01 LAB — ECHOCARDIOGRAM COMPLETE
AR max vel: 1.44 cm2
AV Area VTI: 1.41 cm2
AV Area mean vel: 1.39 cm2
AV Mean grad: 10 mmHg
AV Peak grad: 18.1 mmHg
Ao pk vel: 2.13 m/s
Height: 72 in
S' Lateral: 4.7 cm
Single Plane A4C EF: 30.1 %
Weight: 3356.8 oz

## 2021-01-01 LAB — GLUCOSE, CAPILLARY
Glucose-Capillary: 106 mg/dL — ABNORMAL HIGH (ref 70–99)
Glucose-Capillary: 215 mg/dL — ABNORMAL HIGH (ref 70–99)
Glucose-Capillary: 195 mg/dL — ABNORMAL HIGH (ref 70–99)
Glucose-Capillary: 216 mg/dL — ABNORMAL HIGH (ref 70–99)

## 2021-01-01 LAB — BASIC METABOLIC PANEL
Anion gap: 12 (ref 5–15)
BUN: 54 mg/dL — ABNORMAL HIGH (ref 8–23)
CO2: 26 mmol/L (ref 22–32)
Calcium: 8.9 mg/dL (ref 8.9–10.3)
Chloride: 95 mmol/L — ABNORMAL LOW (ref 98–111)
Creatinine, Ser: 1.85 mg/dL — ABNORMAL HIGH (ref 0.61–1.24)
GFR, Estimated: 38 mL/min — ABNORMAL LOW (ref >60)
Glucose, Bld: 106 mg/dL — ABNORMAL HIGH (ref 70–99)
Potassium: 3.4 mmol/L — ABNORMAL LOW (ref 3.5–5.1)
Sodium: 133 mmol/L — ABNORMAL LOW (ref 135–145)

## 2021-01-01 MED ORDER — POTASSIUM CHLORIDE CRYS ER 20 MEQ PO TBCR
30.0000 meq | EXTENDED_RELEASE_TABLET | Freq: Two times a day (BID) | ORAL | Status: DC
  Administered 2021-01-02: 30 meq via ORAL
  Filled 2021-01-01: qty 1

## 2021-01-01 MED ORDER — FUROSEMIDE 10 MG/ML IJ SOLN
100.0000 mg | Freq: Three times a day (TID) | INTRAVENOUS | Status: DC
  Administered 2021-01-01 (×2): 100 mg via INTRAVENOUS
  Filled 2021-01-01 (×2): qty 10
  Filled 2021-01-01: qty 2
  Filled 2021-01-01: qty 10

## 2021-01-01 MED ORDER — CAMPHOR-MENTHOL 0.5-0.5 % EX LOTN
TOPICAL_LOTION | CUTANEOUS | Status: DC | PRN
  Filled 2021-01-01: qty 222

## 2021-01-01 MED ORDER — SUCRALFATE 1 G PO TABS: 1.0000 g | ORAL_TABLET | Freq: Four times a day (QID) | ORAL | Status: DC

## 2021-01-01 MED ORDER — FUROSEMIDE 10 MG/ML IJ SOLN: 80.0000 mg | Freq: Three times a day (TID) | INTRAMUSCULAR | Status: DC

## 2021-01-01 MED ORDER — POTASSIUM CHLORIDE CRYS ER 20 MEQ PO TBCR
40.0000 meq | EXTENDED_RELEASE_TABLET | Freq: Three times a day (TID) | ORAL | Status: AC
  Administered 2021-01-01 (×3): 40 meq via ORAL
  Filled 2021-01-01 (×3): qty 2

## 2021-01-01 NOTE — Progress Notes (Signed)
  Echocardiogram 2D Echocardiogram has been performed.  Johny Chess 01/01/2021, 12:19 PM

## 2021-01-01 NOTE — Evaluation (Signed)
Physical Therapy Evaluation and Discharge Patient Details Name: John [name] MRN: [id number] DOB: [birth year] Today's Date: 01/01/2021  History of Present Illness  Pt is a 75 y.o. M who presents with SOB and found to be in acute decompensated heart failure with lower extremity edema and 12 lb weight gain over last 72 hours. Significant PMH: biventricular failure nonischemic last 2D echo showed an EF of 25%, paroxysmal atrial fibrillation/sick sinus syndrome s/p pacer, CKD stage IIIb, severe aortic stenosis s/p TAVR's in February 2022, diabetes mellitus.  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 Pt presents with decreased cardiopulmonary endurance and balance deficits. Ambulating 300 feet with a walker at a modI level, SpO2 95%, HR 74 bpm. Exercise/activity recommendations provided and recommended use of walker initially for balance. No further acute PT needs. Thank you for this consult.      Recommendations for follow up therapy are one component of a multi-disciplinary discharge planning process, led by the attending physician.  Recommendations may be updated based on patient status, additional functional criteria and insurance authorization.  Follow Up Recommendations No PT follow up;Supervision - Intermittent    Equipment Recommendations  None recommended by PT    Recommendations for Other Services       Precautions / Restrictions Precautions Precautions: Fall Restrictions Weight Bearing Restrictions: No      Mobility  Bed Mobility Overal bed mobility: Modified Independent                  Transfers Overall transfer level: Modified independent Equipment used: Rolling walker (2 wheeled)                Ambulation/Gait Ambulation/Gait assistance: Modified independent (Device/Increase time) Gait Distance (Feet): 300 Feet Assistive device: Rolling walker (2 wheeled) Gait Pattern/deviations: Step-through  pattern;Decreased stride length Gait velocity: decreased   General Gait Details: slow and steady pace, cues for activity pacing  Stairs            Wheelchair Mobility    Modified Rankin (Stroke Patients Only)       Balance Overall balance assessment: Needs assistance Sitting-balance support: Feet supported Sitting balance-Leahy Scale: Good     Standing balance support: Bilateral upper extremity supported Standing balance-Leahy Scale: Poor Standing balance comment: reliant on RW                             Pertinent Vitals/Pain Pain Assessment: No/denies pain    Home Living Family/patient expects to be discharged to:: Private residence Living Arrangements: Spouse/significant other Available Help at Discharge: Family;Available 24 hours/day Type of Home: House Home Access: Level entry     Home Layout: Able to live on main level with bedroom/bathroom;Two level;Laundry or work area in [place]: Kasandra Knudsen - single point;Shower seat - built in;Walker - 2 wheels;Grab bars - tub/shower      Prior Function Level of Independence: Independent with assistive device(s)         Comments: community ambulatory with cane     Hand Dominance        Extremity/Trunk Assessment   Upper Extremity Assessment Upper Extremity Assessment: Overall WFL for tasks assessed    Lower Extremity Assessment Lower Extremity Assessment: Overall WFL for tasks assessed    Cervical / Trunk Assessment Cervical / Trunk Assessment: Normal  Communication   Communication: No difficulties  Cognition Arousal/Alertness: Awake/alert Behavior During Therapy: WFL for tasks assessed/performed Overall  Cognitive Status: Within Functional Limits for tasks assessed                                        General Comments      Exercises     Assessment/Plan    PT Assessment Patent does not need any further PT services  PT Problem List         PT  Treatment Interventions      PT Goals (Current goals can be found in the Care Plan section)  Acute Rehab PT Goals Patient Stated Goal: return to baseline PT Goal Formulation: All assessment and education complete, DC therapy    Frequency     Barriers to discharge        Co-evaluation               AM-PAC PT "6 Clicks" Mobility  Outcome Measure Help needed turning from your back to your side while in a flat bed without using bedrails?: None Help needed moving from lying on your back to sitting on the side of a flat bed without using bedrails?: None Help needed moving to and from a bed to a chair (including a wheelchair)?: None Help needed standing up from a chair using your arms (e.g., wheelchair or bedside chair)?: None Help needed to walk in hospital room?: None Help needed climbing 3-5 steps with a railing? : A Little 6 Click Score: 23    End of Session   Activity Tolerance: Patient tolerated treatment well Patient left: in bed;with call bell/phone within reach;with family/visitor present Nurse Communication: Mobility status PT Visit Diagnosis: Difficulty in walking, not elsewhere classified (R26.2);Unsteadiness on feet (R26.81)    Time: 1007-1219 PT Time Calculation (min) (ACUTE ONLY): 23 min   Charges:   PT Evaluation $PT Eval Moderate Complexity: 1 Mod PT Treatments $Therapeutic Activity: 8-22 mins      John [name], PT, DPT Acute Rehabilitation Services Pager [phone] Office [phone]   Deno Etienne 01/01/2021, 3:43 PM

## 2021-01-01 NOTE — Progress Notes (Signed)
[facility] PROGRESS NOTE    Progress Note  John [name]  ZTI:[id number] DOB: 06/14/1945 DOA: 12/30/2020 PCP: [name], MD     Brief Narrative:   John [name] is an 75 y.o. male past medical history significant for biventricular failure nonischemic last 2D echo that showed an EF of 25% with global hypokinesia and elevated pulmonary pressures paroxysmal atrial fibrillation/sick sinus syndrome status post pacer, not on anticoagulation as he was found to have an EGD duodenal ulcer with a visible vessel so Eliquis was held in July 2022, during this time there is a cannot be clips and underwent IR embolization., severe aortic stenosis status post TAVR's in February 2022, chronic kidney disease stage IIIb, with a baseline creatinine of 1.5-1.8, diabetes mellitus who comes into the hospital for shortness of breath and was found to be in acute decompensated heart failure with lower extremity edema and a 12 pound weight gain over the last 72 hours.   Assessment/Plan:   Acute on chronic systolic (congestive) heart failure [facility]): Estimated dry weight around 93-92 kg on admission 100 kg.  With an increased BNP of 1500 appears volume overloaded on physical exam. Started on IV Lasix was diuresed about 3 L.  Patient's weight is slowly improving this morning is 92.2 kg. Out of bed to chair, consult physical therapy. Patient still appears significantly volume overloaded on physical exam.  Acute kidney injury superimposed on chronic kidney disease stage IIIb/cardiorenal syndrome: With a baseline creatinine 1.5-1.8 on admission 2.1 likely due to volume overload. His creatinine has improved to baseline with IV diuresis. Continue strict I's and O's restrict his fluid. Try to keep potassium greater than 4 magnesium greater than 2.  Hypokalemia: Likely due to diuresis: Replete orally more aggressively repeat the basic metabolic panel in the morning there is likely due to IV  diuresis.  Paroxysmal atrial fibrillation/sick sinus syndrome: With a chads Vascor of greater than 6, not on anticoagulation due to recent GI bleed with a visible vessel not able to be clipped and had to be embolized. He is currently rate controlled with a pacer in place on Coreg.  DM type 2 causing vascular disease ([place]): Globin A1c of 9.2 question compliance with his home dose of insulin at home.   Blood glucose well controlled in house continue current regimen.    Allergic rhinitis: On loratidine.  DVT prophylaxis: scd Family Communication:none Status is: Inpatient  Remains inpatient appropriate because:Hemodynamically unstable  Dispo: The patient is from: Home              Anticipated d/c is to: Home              Patient currently is not medically stable to d/c.   Difficult to place patient No   Code Status:     Code Status Orders  (From admission, onward)           Start     Ordered   12/31/20 0453  Full code  Continuous        12/31/20 0452           Code Status History     Date Active Date Inactive Code Status Order ID Comments User Context   10/17/2020 1258 10/22/2020 2009 Full Code [id number]  [name], MD ED   06/08/2020 1308 06/08/2020 2043 Full Code [id number]  Evans Lance, MD Inpatient   04/25/2020 1825 05/15/2020 2226 Full Code [id number]  Bensimhon, Shaune Pascal, MD Inpatient   03/10/2020 [id number] 03/14/2020  [place] DNR [id number]  Orson Eva, MD ED   03/09/2020 1943 03/10/2020 0747 Full Code [id number]  Bernadette Hoit, DO ED   12/21/2019 0841 12/21/2019 1622 Full Code [id number]  Nelva Bush, MD Inpatient   11/11/2018 2125 11/17/2018 1821 Full Code [id number]  Vianne Bulls, MD Inpatient   08/19/2018 1943 08/22/2018 1852 Full Code [id number]  Tommie Raymond, NP Inpatient   03/02/2018 0734 03/03/2018 1502 Full Code [id number]  Lady Deutscher, MD ED   12/07/2015 1758 12/08/2015 1657 Full Code [id number]  Evans Lance, MD Inpatient   11/20/2015 1929  11/23/2015 2010 Full Code [id number]  Vianne Bulls, MD Inpatient   08/25/2015 1708 08/30/2015 1656 Full Code [id number]  Isaac Bliss, Rayford Halsted, MD Inpatient         IV Access:   Peripheral IV   Procedures and diagnostic studies:   DG Chest 2 View  Result Date: 12/30/2020 CLINICAL DATA:  Shortness of breath EXAM: CHEST - 2 VIEW COMPARISON:  10/17/2020 FINDINGS: Left-sided pacing device and valve prosthesis as before. Mild cardiomegaly without overt edema. Mild diffuse reticular opacity suggesting underlying chronic disease. No consolidation or pneumothorax. IMPRESSION: No active cardiopulmonary disease.  Mild cardiomegaly. Electronically Signed   By: Donavan Foil M.D.   On: 12/30/2020 16:11     Medical Consultants:   None.   Subjective:    John [name] relates his breathing is significantly better.  Objective:    Vitals:   12/31/20 1937 01/01/21 0027 01/01/21 0352 01/01/21 0738  BP: 108/70 (!) 110/59 (!) 101/53 112/66  Pulse: 60 67 85 88  Resp: 17 20 19 14   Temp: 97.8 F (36.6 C) 98.2 F (36.8 C) (!) 97.3 F (36.3 C) 97.9 F (36.6 C)  TempSrc: Oral Oral Oral Oral  SpO2: 97% 97% 97% 98%  Weight:   95.2 kg   Height:       SpO2: 98 %   Intake/Output Summary (Last 24 hours) at 01/01/2021 0842 Last data filed at 01/01/2021 0741 Gross per 24 hour  Intake 417 ml  Output 2125 ml  Net -1708 ml    Filed Weights   12/30/20 1534 01/01/21 0352  Weight: 99.8 kg 95.2 kg    Exam: General exam: In no acute distress. Respiratory system: Good air movement and clear to auscultation. Cardiovascular system: S1 & S2 heard, RRR.  Positive JVD Gastrointestinal system: Abdomen is nondistended, soft and nontender.  Extremities: Trace edema Skin: No rashes, lesions or ulcers Psychiatry: Judgement and insight appear normal. Mood & affect appropriate.   Data Reviewed:    Labs: Basic Metabolic Panel: Recent Labs  Lab 12/26/20 1118 12/30/20 1534 12/31/20 0508  01/01/21 0711  NA 133* 131* 133* 133*  K 5.0 4.2 3.3* 3.4*  CL 96 97* 99 95*  CO2 19* 23 24 26   GLUCOSE 263* 235* 133* 106*  BUN 54* 62* 59* 54*  CREATININE 2.16* 2.11* 1.94* 1.85*  CALCIUM 8.7 8.8* 8.9 8.9  MG  --   --  2.6*  --   PHOS  --   --  3.8  --     GFR Estimated Creatinine Clearance: 41.3 mL/min (A) (by C-G formula based on SCr of 1.85 mg/dL (H)). Liver Function Tests: Recent Labs  Lab 12/31/20 0508  AST 33  ALT 17  ALKPHOS 207*  BILITOT 2.3*  PROT 7.4  ALBUMIN 3.0*    No results for input(s): LIPASE, AMYLASE in the last 168 hours. No results for input(s): AMMONIA in the last  168 hours. Coagulation profile No results for input(s): INR, PROTIME in the last 168 hours. COVID-19 Labs  No results for input(s): DDIMER, FERRITIN, LDH, CRP in the last 72 hours.  Lab Results  Component Value Date   SARSCOV2NAA NEGATIVE 12/31/2020   [place] NEGATIVE 10/17/2020   [place] NEGATIVE 06/06/2020   [place] NEGATIVE 04/25/2020    CBC: Recent Labs  Lab 12/26/20 1118 12/30/20 1534 12/31/20 0508  WBC 7.0 6.7 7.0  NEUTROABS 5.2 4.4  --   HGB 9.8* 9.6* 9.1*  HCT 31.0* 30.7* 28.8*  MCV 83 83.0 81.6  PLT 171 128* 136*    Cardiac Enzymes: No results for input(s): CKTOTAL, CKMB, CKMBINDEX, TROPONINI in the last 168 hours. BNP (last 3 results) No results for input(s): PROBNP in the last 8760 hours. CBG: Recent Labs  Lab 12/31/20 0802 12/31/20 1210 12/31/20 1518 12/31/20 2112 01/01/21 0616  GLUCAP 121* 155* 157* 169* 106*   D-Dimer: No results for input(s): DDIMER in the last 72 hours. Hgb A1c: Recent Labs    12/31/20 0726  HGBA1C 9.2*   Lipid Profile: No results for input(s): CHOL, HDL, LDLCALC, TRIG, CHOLHDL, LDLDIRECT in the last 72 hours. Thyroid function studies: No results for input(s): TSH, T4TOTAL, T3FREE, THYROIDAB in the last 72 hours.  Invalid input(s): FREET3 Anemia work up: No results for input(s): VITAMINB12, FOLATE,  FERRITIN, TIBC, IRON, RETICCTPCT in the last 72 hours. Sepsis Labs: Recent Labs  Lab 12/26/20 1118 12/30/20 1534 12/31/20 0508  WBC 7.0 6.7 7.0    Microbiology Recent Results (from the past 240 hour(s))  Resp Panel by RT-PCR (Flu A&B, Covid) Nasopharyngeal Swab     Status: None   Collection Time: 12/31/20  2:29 AM   Specimen: Nasopharyngeal Swab; Nasopharyngeal(NP) swabs in vial transport medium  Result Value Ref Range Status   SARS Coronavirus 2 by RT PCR NEGATIVE NEGATIVE Final    Comment: (NOTE) SARS-CoV-2 target nucleic acids are NOT DETECTED.  The SARS-CoV-2 RNA is generally detectable in upper respiratory specimens during the acute phase of infection. The lowest concentration of SARS-CoV-2 viral copies this assay can detect is 138 copies/mL. A negative result does not preclude SARS-Cov-2 infection and should not be used as the sole basis for treatment or other patient management decisions. A negative result may occur with  improper specimen collection/handling, submission of specimen other than nasopharyngeal swab, presence of viral mutation(s) within the areas targeted by this assay, and inadequate number of viral copies(<138 copies/mL). A negative result must be combined with clinical observations, patient history, and epidemiological information. The expected result is Negative.  Fact Sheet for Patients:  EntrepreneurPulse.com.au  Fact Sheet for Healthcare Providers:  IncredibleEmployment.be  This test is no t yet approved or cleared by the [place] FDA and  has been authorized for detection and/or diagnosis of SARS-CoV-2 by FDA under an Emergency Use Authorization (EUA). This EUA will remain  in effect (meaning this test can be used) for the duration of the COVID-19 declaration under Section 564(b)(1) of the Act, 21 U.S.C.section 360bbb-3(b)(1), unless the authorization is terminated  or revoked sooner.       Influenza  A by PCR NEGATIVE NEGATIVE Final   Influenza B by PCR NEGATIVE NEGATIVE Final    Comment: (NOTE) The Xpert Xpress SARS-CoV-2/FLU/RSV plus assay is intended as an aid in the diagnosis of influenza from Nasopharyngeal swab specimens and should not be used as a sole basis for treatment. Nasal washings and aspirates are unacceptable for Xpert Xpress SARS-CoV-2/FLU/RSV testing.  Fact Sheet for Patients: EntrepreneurPulse.com.au  Fact Sheet for Healthcare Providers: IncredibleEmployment.be  This test is not yet approved or cleared by the [place] FDA and has been authorized for detection and/or diagnosis of SARS-CoV-2 by FDA under an Emergency Use Authorization (EUA). This EUA will remain in effect (meaning this test can be used) for the duration of the COVID-19 declaration under Section 564(b)(1) of the Act, 21 U.S.C. section 360bbb-3(b)(1), unless the authorization is terminated or revoked.  Performed at [facility], [place] [address] [place], [place] [address]      Medications:    allopurinol  150 mg Oral Daily   aspirin EC  81 mg Oral Daily   carvedilol  3.125 mg Oral BID WC   influenza vaccine adjuvanted  0.5 mL Intramuscular Tomorrow-1000   insulin aspart  0-5 Units Subcutaneous QHS   insulin aspart  0-9 Units Subcutaneous TID WC   insulin aspart  3 Units Subcutaneous TID WC   insulin glargine-yfgn  10 Units Subcutaneous BID   [START ON 01/02/2021] metolazone  2.5 mg Oral Q Mon   pantoprazole  40 mg Oral BID AC   potassium chloride  40 mEq Oral BID   spironolactone  12.5 mg Oral Daily   Continuous Infusions:  furosemide 100 mg (01/01/21 0615)      LOS: 1 day   Charlynne Cousins  [facility]  01/01/2021, 8:42 AM

## 2021-01-02 ENCOUNTER — Telehealth (INDEPENDENT_AMBULATORY_CARE_PROVIDER_SITE_OTHER): Payer: Self-pay

## 2021-01-02 DIAGNOSIS — N189 Chronic kidney disease, unspecified: Secondary | ICD-10-CM | POA: Diagnosis not present

## 2021-01-02 DIAGNOSIS — I5023 Acute on chronic systolic (congestive) heart failure: Secondary | ICD-10-CM | POA: Diagnosis not present

## 2021-01-02 DIAGNOSIS — N179 Acute kidney failure, unspecified: Secondary | ICD-10-CM | POA: Diagnosis not present

## 2021-01-02 DIAGNOSIS — E1159 Type 2 diabetes mellitus with other circulatory complications: Secondary | ICD-10-CM | POA: Diagnosis not present

## 2021-01-02 LAB — GLUCOSE, CAPILLARY
Glucose-Capillary: 130 mg/dL — ABNORMAL HIGH (ref 70–99)
Glucose-Capillary: 242 mg/dL — ABNORMAL HIGH (ref 70–99)

## 2021-01-02 LAB — BASIC METABOLIC PANEL
Anion gap: 9 (ref 5–15)
BUN: 57 mg/dL — ABNORMAL HIGH (ref 8–23)
CO2: 27 mmol/L (ref 22–32)
Calcium: 8.9 mg/dL (ref 8.9–10.3)
Chloride: 96 mmol/L — ABNORMAL LOW (ref 98–111)
Creatinine, Ser: 2.23 mg/dL — ABNORMAL HIGH (ref 0.61–1.24)
GFR, Estimated: 30 mL/min — ABNORMAL LOW (ref >60)
Glucose, Bld: 140 mg/dL — ABNORMAL HIGH (ref 70–99)
Potassium: 4.3 mmol/L (ref 3.5–5.1)
Sodium: 132 mmol/L — ABNORMAL LOW (ref 135–145)

## 2021-01-02 NOTE — Telephone Encounter (Signed)
Thanks, Mitzie will call them to set it up?

## 2021-01-02 NOTE — Telephone Encounter (Signed)
I spoke with the patient spouse. She is aware the [facility] has been cancelled for 01/03/2021 and patient will need a hospital follow up before proceeding with the [place]. Spouse aware someone will be calling them to set up Hospital follow up with Dr. Jenetta Downer.

## 2021-01-02 NOTE — Discharge Summary (Signed)
Physician Discharge Summary  John [name] INO:[id number] DOB: 01/15/1946 DOA: 12/30/2020  PCP: [name], MD  Admit date: 12/30/2020 Discharge date: 01/02/2021  Admitted From: Home Disposition:  Home  Recommendations for Outpatient Follow-up:  Follow up with Cardiologyin 1-2 weeks Please obtain BMP/CBC in one week   Home Health:no Equipment/Devices:none  Discharge Condition:Stable CODE STATUS:Full Diet recommendation: Heart Healthy  Brief/Interim Summary: 75 y.o. male past medical history significant for biventricular failure nonischemic last 2D echo that showed an EF of 25% with global hypokinesia and elevated pulmonary pressures paroxysmal atrial fibrillation/sick sinus syndrome status post pacer, not on anticoagulation as he was found to have an EGD duodenal ulcer with a visible vessel so Eliquis was held in July 2022, during this time there is a cannot be clips and underwent IR embolization., severe aortic stenosis status post TAVR's in February 2022, chronic kidney disease stage IIIb, with a baseline creatinine of 1.5-1.8, diabetes mellitus who comes into the hospital for shortness of breath and was found to be in acute decompensated heart failure with lower extremity edema and a 12 pound weight gain over the last 72 hours.  Discharge Diagnoses:  Principal Problem:   Acute on chronic systolic (congestive) heart failure (HCC) Active Problems:   DM type 2 causing vascular disease (HCC)   Paroxysmal atrial fibrillation (HCC)   AKI (acute kidney injury) (HCC)   SOB (shortness of breath)   GERD (gastroesophageal reflux disease)   History of anemia due to chronic kidney disease  Acute on chronic systolic heart failure: With an estimated dry weight around 93-92 kg at admission 100. He related he has been eating a lot of ice at home, did not realize that eyes turned into water he was educated. He was started on IV Lasix and metolazone he diuresed over 5 L he was continue his  current home regimen. No changes was made he will follow-up with cardiology as an outpatient.  Acute kidney injury superimposed on chronic kidney disease stage IIIb/cardiorenal syndrome: With a baseline creatinine 1.5-1.8 on admission 2.1 likely due to volume overload he was started on IV diuresis and he diuresed 5 L acute continue strict I's and O's and daily weights potassium was Greater than 4 magnesium greater than 2. He will follow-up with cardiology in 1 week check a basic metabolic panel then.  Hypokalemia: IV diuresis replete orally now resolved.  Paroxysmal atrial fibrillation sick sinus syndrome:  With a chads Vascor greater than 6 not on anticoagulation due to recent GI bleed with visible vessel not amenable to clipping had to be embolized by IR and his previous admission. He was rate controlled Coreg, paced rhythm no change made to his medication.  Diabetes mellitus type 2 causing cardiovascular disease: Within hemoglobin A1c of 9.2 likely due to noncompliance with his home insulin at home. He was restarted on his home regimen at home he remained very well controlled in house with a sliding scale in his home regimen. He will follow-up with PCP in 2 weeks  Discharge Instructions  Discharge Instructions     Diet - low sodium heart healthy   Complete by: As directed    Increase activity slowly   Complete by: As directed       Allergies as of 01/02/2021   No Known Allergies      Medication List     STOP taking these medications    pantoprazole 40 MG tablet Commonly known as: Protonix       TAKE these medications  Accu-Chek Aviva Plus test strip Generic drug: glucose blood TEST BLOOD SUGAR TWICE DAILY BEFORE BREAKFAST AND AT BEDTIME   acetaminophen 325 MG tablet Commonly known as: TYLENOL Take 2 tablets (650 mg total) by mouth every 4 (four) hours as needed for headache or mild pain.   allopurinol 300 MG tablet Commonly known as: ZYLOPRIM Take 0.5  tablets (150 mg total) by mouth daily.   Anti-Itch lotion Generic drug: camphor-menthol Apply 1 application topically daily as needed for itching.   aspirin EC 81 MG tablet Take 1 tablet (81 mg total) by mouth daily with breakfast.   BD Pen Needle Nano U/F 32G X 4 MM Misc Generic drug: Insulin Pen Needle 1 each by Does not apply route 4 (four) times daily.   benzonatate 100 MG capsule Commonly known as: TESSALON Take 1 capsule (100 mg total) by mouth 3 (three) times daily as needed for cough.   carvedilol 3.125 MG tablet Commonly known as: COREG TAKE 1 TABLET IN THE MORNING AND 2 TABLETS IN THE EVENING What changed:  how much to take how to take this when to take this additional instructions   colchicine 0.6 MG tablet Take 1 tablet (0.6 mg total) by mouth daily as needed.   loratadine 10 MG tablet Commonly known as: CLARITIN TAKE 1 TABLET BY MOUTH EVERY DAY   metolazone 2.5 MG tablet Commonly known as: ZAROXOLYN Take 1 tablet (2.5 mg total) by mouth once a week. Every Monday   One-A-Day Mens 50+ Tabs Take 1 tablet by mouth daily with breakfast. Men   potassium chloride SA 20 MEQ tablet Commonly known as: Klor-Con M20 Take 1.5 tablets (30 mEq total) by mouth 2 (two) times daily. Take 2 extra tabs on Mon with metolazone   spironolactone 25 MG tablet Commonly known as: ALDACTONE Take 0.5 tablets (12.5 mg total) by mouth daily.   sucralfate 1 g tablet Commonly known as: Carafate Take 1 tablet (1 g total) by mouth 4 (four) times daily.   torsemide 20 MG tablet Commonly known as: DEMADEX TAKE 3 TABLETS (60 MG TOTAL) BY MOUTH 2 (TWO) TIMES DAILY.   Tyler Aas FlexTouch 100 UNIT/ML FlexTouch Pen Generic drug: insulin degludec Inject 40 Units into the skin at bedtime.   Trulicity 1.5 ON/6.2XB Sopn Generic drug: Dulaglutide INJECT 1.5MG (1 PEN) SUBCUTANEOUSLY EVERY WEEK What changed:  how much to take how to take this when to take this additional instructions         No Known Allergies  Consultations: None   Procedures/Studies: DG Chest 2 View  Result Date: 12/30/2020 CLINICAL DATA:  Shortness of breath EXAM: CHEST - 2 VIEW COMPARISON:  10/17/2020 FINDINGS: Left-sided pacing device and valve prosthesis as before. Mild cardiomegaly without overt edema. Mild diffuse reticular opacity suggesting underlying chronic disease. No consolidation or pneumothorax. IMPRESSION: No active cardiopulmonary disease.  Mild cardiomegaly. Electronically Signed   By: Donavan Foil M.D.   On: 12/30/2020 16:11   ECHOCARDIOGRAM COMPLETE  Result Date: 01/01/2021    ECHOCARDIOGRAM REPORT   Patient Name:   John [name] Date of Exam: 01/01/2021 Medical Rec #:  [id number]        Height:       72.0 in Accession #:    [id number]       Weight:       209.8 lb Date of Birth:  10-May-1945        BSA:          2.174 m Patient Age:  75 years         BP:           112/66 mmHg Patient Gender: M                HR:           60 bpm. Exam Location:  Inpatient Procedure: 2D Echo Indications:    acute systolic CHF  History:        Patient has prior history of Echocardiogram examinations, most                 recent 06/23/2020. Pacemaker, chronic kidney disease,                 Arrythmias:Atrial Fibrillation, Signs/Symptoms:Shortness of                 Breath; Risk Factors:Diabetes, Hypertension and Dyslipidemia.                 Aortic Valve: 29 mm Edwards Sapien prosthetic, stented (TAVR)                 valve is present in the aortic position.  Sonographer:    Johny Chess RDCS Referring Phys: [id number] [place]  1. Left ventricular ejection fraction, by estimation, is 30 to 35%. The left ventricle has moderately decreased function. The left ventricle demonstrates global hypokinesis. The left ventricular internal cavity size was mildly dilated. Left ventricular diastolic function could not be evaluated.  2. Right ventricular systolic function is low normal. The right  ventricular size is moderately enlarged. There is mildly elevated pulmonary artery systolic pressure.  3. Left atrial size was severely dilated.  4. Right atrial size was severely dilated.  5. The mitral valve is normal in structure. Mild to moderate mitral valve regurgitation.  6. Tricuspid valve regurgitation is moderate.  7. The aortic valve has been repaired/replaced. Aortic valve regurgitation is not visualized. No aortic stenosis is present. There is a 29 mm Edwards Sapien prosthetic (TAVR) valve present in the aortic position. Aortic valve mean gradient measures 10.0  mmHg. Aortic valve Vmax measures 2.13 m/s.  8. The inferior vena cava is dilated in size with <50% respiratory variability, suggesting right atrial pressure of 15 mmHg. Comparison(s): No significant change from prior study. Prior images reviewed side by side. FINDINGS  Left Ventricle: Left ventricular ejection fraction, by estimation, is 30 to 35%. The left ventricle has moderately decreased function. The left ventricle demonstrates global hypokinesis. The left ventricular internal cavity size was mildly dilated. There is no left ventricular hypertrophy. Abnormal (paradoxical) septal motion, consistent with RV pacemaker. Left ventricular diastolic function could not be evaluated due to atrial fibrillation. Left ventricular diastolic function could not be evaluated. Right Ventricle: The right ventricular size is moderately enlarged. No increase in right ventricular wall thickness. Right ventricular systolic function is low normal. There is mildly elevated pulmonary artery systolic pressure. The tricuspid regurgitant  velocity is 2.40 m/s, and with an assumed right atrial pressure of 15 mmHg, the estimated right ventricular systolic pressure is 33.0 mmHg. Left Atrium: Left atrial size was severely dilated. Right Atrium: Right atrial size was severely dilated. Pericardium: There is no evidence of pericardial effusion. Mitral Valve: The mitral valve  is normal in structure. Mild to moderate mitral valve regurgitation, with centrally-directed jet. Tricuspid Valve: The tricuspid valve is normal in structure. Tricuspid valve regurgitation is moderate. Aortic Valve: The aortic valve has been repaired/replaced. Aortic valve regurgitation is not visualized. No aortic  stenosis is present. Aortic valve mean gradient measures 10.0 mmHg. Aortic valve peak gradient measures 18.1 mmHg. Aortic valve area, by VTI measures 1.41 cm. There is a 29 mm Edwards Sapien prosthetic, stented (TAVR) valve present in the aortic position. Pulmonic Valve: The pulmonic valve was normal in structure. Pulmonic valve regurgitation is trivial. Aorta: The aortic root and ascending aorta are structurally normal, with no evidence of dilitation. Venous: The inferior vena cava is dilated in size with less than 50% respiratory variability, suggesting right atrial pressure of 15 mmHg. IAS/Shunts: No atrial level shunt detected by color flow Doppler. Additional Comments: A device lead is visualized in the right ventricle.  LEFT VENTRICLE PLAX 2D LVIDd:         5.80 cm      Diastology LVIDs:         4.70 cm      LV e' lateral: 11.10 cm/s LV PW:         1.30 cm LV IVS:        0.90 cm LVOT diam:     2.00 cm LV SV:         58 LV SV Index:   27 LVOT Area:     3.14 cm  LV Volumes (MOD) LV vol d, MOD A4C: 153.0 ml LV vol s, MOD A4C: 107.0 ml LV SV MOD A4C:     153.0 ml RIGHT VENTRICLE             IVC RV S prime:     14.10 cm/s  IVC diam: 2.80 cm TAPSE (M-mode): 1.6 cm LEFT ATRIUM              Index        RIGHT ATRIUM           Index LA diam:        5.30 cm  2.44 cm/m   RA Area:     27.20 cm LA Vol (A2C):   106.0 ml 48.75 ml/m  RA Volume:   88.30 ml  40.61 ml/m LA Vol (A4C):   98.1 ml  45.12 ml/m LA Biplane Vol: 103.0 ml 47.37 ml/m  AORTIC VALVE AV Area (Vmax):    1.44 cm AV Area (Vmean):   1.39 cm AV Area (VTI):     1.41 cm AV Vmax:           213.00 cm/s AV Vmean:          141.000 cm/s AV VTI:             0.412 m AV Peak Grad:      18.1 mmHg AV Mean Grad:      10.0 mmHg LVOT Vmax:         97.30 cm/s LVOT Vmean:        62.400 cm/s LVOT VTI:          0.185 m LVOT/AV VTI ratio: 0.45  AORTA Ao Asc diam: 3.30 cm TRICUSPID VALVE TR Peak grad:   23.0 mmHg TR Vmax:        240.00 cm/s  SHUNTS Systemic VTI:  0.18 m Systemic Diam: 2.00 cm Dani Gobble Croitoru MD Electronically signed by Sanda Klein MD Signature Date/Time: 01/01/2021/2:27:58 PM    Final    (Echo, Carotid, EGD, Colonoscopy, ERCP)    Subjective: No complaints  Discharge Exam: Vitals:   01/02/21 0022 01/02/21 0331  BP: (!) 100/52 104/74  Pulse: 71 64  Resp: 15 15  Temp: 98.2 F (36.8 C) (!) 97.4 F (  36.3 C)  SpO2: 99% 99%   Vitals:   01/01/21 1954 01/02/21 0022 01/02/21 0100 01/02/21 0331  BP: 112/70 (!) 100/52  104/74  Pulse: 64 71  64  Resp: 14 15  15   Temp: 97.7 F (36.5 C) 98.2 F (36.8 C)  (!) 97.4 F (36.3 C)  TempSrc: Oral Oral  Oral  SpO2: 99% 99%  99%  Weight:   94.8 kg   Height:        General: Pt is alert, awake, not in acute distress Cardiovascular: RRR, S1/S2 +, no rubs, no gallops Respiratory: CTA bilaterally, no wheezing, no rhonchi Abdominal: Soft, NT, ND, bowel sounds + Extremities: no edema, no cyanosis    The results of significant diagnostics from this hospitalization (including imaging, microbiology, ancillary and laboratory) are listed below for reference.     Microbiology: Recent Results (from the past 240 hour(s))  Resp Panel by RT-PCR (Flu A&B, Covid) Nasopharyngeal Swab     Status: None   Collection Time: 12/31/20  2:29 AM   Specimen: Nasopharyngeal Swab; Nasopharyngeal(NP) swabs in vial transport medium  Result Value Ref Range Status   SARS Coronavirus 2 by RT PCR NEGATIVE NEGATIVE Final    Comment: (NOTE) SARS-CoV-2 target nucleic acids are NOT DETECTED.  The SARS-CoV-2 RNA is generally detectable in upper respiratory specimens during the acute phase of infection. The  lowest concentration of SARS-CoV-2 viral copies this assay can detect is 138 copies/mL. A negative result does not preclude SARS-Cov-2 infection and should not be used as the sole basis for treatment or other patient management decisions. A negative result may occur with  improper specimen collection/handling, submission of specimen other than nasopharyngeal swab, presence of viral mutation(s) within the areas targeted by this assay, and inadequate number of viral copies(<138 copies/mL). A negative result must be combined with clinical observations, patient history, and epidemiological information. The expected result is Negative.  Fact Sheet for Patients:  EntrepreneurPulse.com.au  Fact Sheet for Healthcare Providers:  IncredibleEmployment.be  This test is no t yet approved or cleared by the [place] FDA and  has been authorized for detection and/or diagnosis of SARS-CoV-2 by FDA under an Emergency Use Authorization (EUA). This EUA will remain  in effect (meaning this test can be used) for the duration of the COVID-19 declaration under Section 564(b)(1) of the Act, 21 U.S.C.section 360bbb-3(b)(1), unless the authorization is terminated  or revoked sooner.       Influenza A by PCR NEGATIVE NEGATIVE Final   Influenza B by PCR NEGATIVE NEGATIVE Final    Comment: (NOTE) The Xpert Xpress SARS-CoV-2/FLU/RSV plus assay is intended as an aid in the diagnosis of influenza from Nasopharyngeal swab specimens and should not be used as a sole basis for treatment. Nasal washings and aspirates are unacceptable for Xpert Xpress SARS-CoV-2/FLU/RSV testing.  Fact Sheet for Patients: EntrepreneurPulse.com.au  Fact Sheet for Healthcare Providers: IncredibleEmployment.be  This test is not yet approved or cleared by the [place] FDA and has been authorized for detection and/or diagnosis of SARS-CoV-2 by FDA under  an Emergency Use Authorization (EUA). This EUA will remain in effect (meaning this test can be used) for the duration of the COVID-19 declaration under Section 564(b)(1) of the Act, 21 U.S.C. section 360bbb-3(b)(1), unless the authorization is terminated or revoked.  Performed at [facility], [place] [address] [place], [place] [address]      Labs: BNP (last 3 results) Recent Labs    10/17/20 2725 12/12/20 1142 12/30/20 1534  BNP 753.0*  851.0* 1,402.2* 9,242.6*   Basic Metabolic Panel: Recent Labs  Lab 12/26/20 1118 12/30/20 1534 12/31/20 0508 01/01/21 0711 01/02/21 0321  NA 133* 131* 133* 133* 132*  K 5.0 4.2 3.3* 3.4* 4.3  CL 96 97* 99 95* 96*  CO2 19* 23 24 26 27   GLUCOSE 263* 235* 133* 106* 140*  BUN 54* 62* 59* 54* 57*  CREATININE 2.16* 2.11* 1.94* 1.85* 2.23*  CALCIUM 8.7 8.8* 8.9 8.9 8.9  MG  --   --  2.6*  --   --   PHOS  --   --  3.8  --   --    Liver Function Tests: Recent Labs  Lab 12/31/20 0508  AST 33  ALT 17  ALKPHOS 207*  BILITOT 2.3*  PROT 7.4  ALBUMIN 3.0*   No results for input(s): LIPASE, AMYLASE in the last 168 hours. No results for input(s): AMMONIA in the last 168 hours. CBC: Recent Labs  Lab 12/26/20 1118 12/30/20 1534 12/31/20 0508  WBC 7.0 6.7 7.0  NEUTROABS 5.2 4.4  --   HGB 9.8* 9.6* 9.1*  HCT 31.0* 30.7* 28.8*  MCV 83 83.0 81.6  PLT 171 128* 136*   Cardiac Enzymes: No results for input(s): CKTOTAL, CKMB, CKMBINDEX, TROPONINI in the last 168 hours. BNP: Invalid input(s): POCBNP CBG: Recent Labs  Lab 01/01/21 0616 01/01/21 1120 01/01/21 1640 01/01/21 2119 01/02/21 0622  GLUCAP 106* 215* 195* 216* 130*   D-Dimer No results for input(s): DDIMER in the last 72 hours. Hgb A1c Recent Labs    12/31/20 0726  HGBA1C 9.2*   Lipid Profile No results for input(s): CHOL, HDL, LDLCALC, TRIG, CHOLHDL, LDLDIRECT in the last 72 hours. Thyroid function studies No results for input(s): TSH, T4TOTAL, T3FREE,  THYROIDAB in the last 72 hours.  Invalid input(s): FREET3 Anemia work up No results for input(s): VITAMINB12, FOLATE, FERRITIN, TIBC, IRON, RETICCTPCT in the last 72 hours. Urinalysis    Component Value Date/Time   COLORURINE YELLOW 05/02/2020 1449   APPEARANCEUR CLEAR 05/02/2020 1449   LABSPEC 1.005 05/02/2020 1449   PHURINE 7.0 05/02/2020 1449   GLUCOSEU >=500 (A) 05/02/2020 1449   HGBUR NEGATIVE 05/02/2020 1449   BILIRUBINUR NEGATIVE 05/02/2020 1449   KETONESUR NEGATIVE 05/02/2020 1449   PROTEINUR NEGATIVE 05/02/2020 1449   UROBILINOGEN 0.2 01/05/2014 0744   NITRITE NEGATIVE 05/02/2020 1449   LEUKOCYTESUR NEGATIVE 05/02/2020 1449   Sepsis Labs Invalid input(s): PROCALCITONIN,  WBC,  LACTICIDVEN Microbiology Recent Results (from the past 240 hour(s))  Resp Panel by RT-PCR (Flu A&B, Covid) Nasopharyngeal Swab     Status: None   Collection Time: 12/31/20  2:29 AM   Specimen: Nasopharyngeal Swab; Nasopharyngeal(NP) swabs in vial transport medium  Result Value Ref Range Status   SARS Coronavirus 2 by RT PCR NEGATIVE NEGATIVE Final    Comment: (NOTE) SARS-CoV-2 target nucleic acids are NOT DETECTED.  The SARS-CoV-2 RNA is generally detectable in upper respiratory specimens during the acute phase of infection. The lowest concentration of SARS-CoV-2 viral copies this assay can detect is 138 copies/mL. A negative result does not preclude SARS-Cov-2 infection and should not be used as the sole basis for treatment or other patient management decisions. A negative result may occur with  improper specimen collection/handling, submission of specimen other than nasopharyngeal swab, presence of viral mutation(s) within the areas targeted by this assay, and inadequate number of viral copies(<138 copies/mL). A negative result must be combined with clinical observations, patient history, and epidemiological information. The  expected result is Negative.  Fact Sheet for Patients:   EntrepreneurPulse.com.au  Fact Sheet for Healthcare Providers:  IncredibleEmployment.be  This test is no t yet approved or cleared by the [place] FDA and  has been authorized for detection and/or diagnosis of SARS-CoV-2 by FDA under an Emergency Use Authorization (EUA). This EUA will remain  in effect (meaning this test can be used) for the duration of the COVID-19 declaration under Section 564(b)(1) of the Act, 21 U.S.C.section 360bbb-3(b)(1), unless the authorization is terminated  or revoked sooner.       Influenza A by PCR NEGATIVE NEGATIVE Final   Influenza B by PCR NEGATIVE NEGATIVE Final    Comment: (NOTE) The Xpert Xpress SARS-CoV-2/FLU/RSV plus assay is intended as an aid in the diagnosis of influenza from Nasopharyngeal swab specimens and should not be used as a sole basis for treatment. Nasal washings and aspirates are unacceptable for Xpert Xpress SARS-CoV-2/FLU/RSV testing.  Fact Sheet for Patients: EntrepreneurPulse.com.au  Fact Sheet for Healthcare Providers: IncredibleEmployment.be  This test is not yet approved or cleared by the [place] FDA and has been authorized for detection and/or diagnosis of SARS-CoV-2 by FDA under an Emergency Use Authorization (EUA). This EUA will remain in effect (meaning this test can be used) for the duration of the COVID-19 declaration under Section 564(b)(1) of the Act, 21 U.S.C. section 360bbb-3(b)(1), unless the authorization is terminated or revoked.  Performed at [facility], [place] [address], [place], [place] [address]      SIGNED:   Charlynne Cousins, MD  [facility] 01/02/2021, 8:07 AM Pager   If 7PM-7AM, please contact night-coverage [email or web address] Password [email or web address]

## 2021-01-02 NOTE — Telephone Encounter (Signed)
Mitzie please call patient and schedule a hospital follow up with Dr. Jenetta Downer. Thanks.  I have called [facility] at [facility], left Hoyle Sauer a detailed message regarding cancellation of Procedure there on 01/03/2021.   Patient inpatient at [facility] per Dr.Castaneda cancel the procedure scheduled on 01/03/2021 and have the patient call to set up a hospital follow up with Dr.Castaneda before rescheduling EGD.

## 2021-01-02 NOTE — Consult Note (Signed)
   [facility] CM Inpatient Consult   01/02/2021  FERNANDO STOIBER 15-Jul-1945 [id number]  [facility] Organization [ACO] Patient: [employer]  Primary Care Provider:  [name], MD [place], is an Embedded provider, who does the [facility] follow up   Patient active in an Embedded practice which has a chronic disease management Embedded Care Management team.  Plan: Notification sent to the [facility] Management and make aware of hospitalization and transition.  PT eval reveals no HH needs noted.  Please contact for further questions,  Natividad Brood, RN BSN [facility] Liaison  [phone] business mobile phone Toll free office [phone]  Fax number: [phone] [email or web address]

## 2021-01-03 ENCOUNTER — Encounter (HOSPITAL_COMMUNITY): Admission: RE | Payer: Self-pay | Source: Ambulatory Visit

## 2021-01-03 ENCOUNTER — Ambulatory Visit (HOSPITAL_COMMUNITY): Admission: RE | Admit: 2021-01-03 | Payer: Medicare HMO | Source: Ambulatory Visit | Admitting: Gastroenterology

## 2021-01-03 SURGERY — ESOPHAGOGASTRODUODENOSCOPY (EGD) WITH PROPOFOL
Anesthesia: Monitor Anesthesia Care

## 2021-01-06 NOTE — Progress Notes (Signed)
ADVANCED HF CLINIC NOTE  Primary Care: John Squibb, MD Primary Cardiologist: Dr. Harl [name]  HF Cardiologist: Dr. Haroldine [name]  Reason for Visit: Chronic Combined Systolic and Diastolic Heart Failure   HPI: John [name] is a 75 y.o. male w/systolic HF due to NICM (cath 9/21 no CAD), h/o CHB s/p PPM 2017,  HTN, DM2, atrial flutter, stage 3b CKD (baseline SCr ~1.5-2.0) and severe AS s/p TAVR 2/22.Marland Kitchen   Echo 7/21 EF 30-35%. RV mildly reduced. Mod-sev MR Moderate AS  TEE 8/21  moderate MR and moderate TR. AoV severely thickened/heavily calcified  [facility] 9/21 No CAD. Moderately elevated filling pressures and moderately reduced CO. CI 1.8. Also concern for RV pacing CM with 42% RV pacing  Several admits in 12/21 for ADHF.   Admitted 2/22 for severe HF and TAVR. Started on dobutamine support. Underwent TVRF 05/03/20. Diuresed and DBA weaned off.   Attempted CRT upgrade 3/22. Left subclavian vein occluded.  Echo 3/22: EF 25-30% moderate MR. TAVR stable  Admitted 7/22 with weakness found to have anemia and hyponatremia. Hgb 14 -> 7.3  got 2u RBCs. EGD with duodenal ulcer with visible vessel so Eliquis held. Could not adequately clip so underwent IR embolization.  Weekly metolazone restarted 9/22.  Admitted 10/8-10/10/22 for A/C CHF. He was diuresed with IV lasix + metolazone. Hospitalization c/b AKI on CKD3. Discharge weight 209 lbs.  Today he returns for HF follow up with his wife. He is more SOB but still able to walk on flat ground some. No CP, dizziness, but belly is swelling more. Trying to eat less and not chewing ice as much. Denies CP, dizziness, edema, abnormal bleeding or PND/orthopnea. Appetite ok. No fever or chills. Weight at home 220 pounds. He has not had metolazone in 2 weeks.   Past Medical History:  Diagnosis Date   Atrial flutter ([place]) 12/2010   Admitted with symptomatic bradycardia (HR 40s), atrial flutter with slow ventricular response 12/2010 + volume overload; AV nodal agents  d/c'd and Pradaxa started; RFA in 01/2011   Chronic kidney disease, stage 3b (HCC)    Chronic systolic CHF (congestive heart failure) (HCC)    Class 2 severe obesity due to excess calories with serious comorbidity and body mass index (BMI) of 37.0 to 37.9 in adult [facility]) 07/17/2017   Diabetes mellitus    non insulin dependant   Hematoma, chest wall 05/15/2020   Hyperlipidemia    Hypertension    Mitral regurgitation    NASH (nonalcoholic steatohepatitis)    NICM (nonischemic cardiomyopathy) ([place])    Osteoarthritis    Polyarticular gout 05/15/2020   Presence of permanent cardiac pacemaker    PSVT (paroxysmal supraventricular tachycardia) (HCC)    Possibly atrial flutter   S/P TAVR (transcatheter aortic valve replacement) 05/03/2020   s/p TAVR with a 29 mm Edwards S3U via the subclavian approach with John [name] & John [name]    Severe aortic stenosis    Current Outpatient Medications  Medication Sig Dispense Refill   ACCU-CHEK AVIVA PLUS test strip TEST BLOOD SUGAR TWICE DAILY BEFORE BREAKFAST AND AT BEDTIME 200 strip 1   acetaminophen (TYLENOL) 325 MG tablet Take 2 tablets (650 mg total) by mouth every 4 (four) hours as needed for headache or mild pain.     allopurinol (ZYLOPRIM) 300 MG tablet Take 0.5 tablets (150 mg total) by mouth daily. (Patient taking differently: Take 150 mg by mouth as needed.) 15 tablet 6   aspirin EC 81 MG tablet Take 1 tablet (81 mg  total) by mouth daily with breakfast. 30 tablet 2   benzonatate (TESSALON) 100 MG capsule Take 1 capsule (100 mg total) by mouth 3 (three) times daily as needed for cough. 90 capsule 11   camphor-menthol (ANTI-ITCH) lotion Apply 1 application topically daily as needed for itching.     carvedilol (COREG) 3.125 MG tablet TAKE 1 TABLET IN THE MORNING AND 2 TABLETS IN THE EVENING 270 tablet 6   colchicine 0.6 MG tablet Take 1 tablet (0.6 mg total) by mouth daily as needed. 30 tablet 6   Dulaglutide (TRULICITY) 1.5 GQ/9.1QX SOPN INJECT  1.5MG (1 PEN) SUBCUTANEOUSLY EVERY WEEK (Patient taking differently: Inject 1.5 mg into the skin once a week. Sunday) 6 mL 1   insulin degludec (TRESIBA FLEXTOUCH) 100 UNIT/ML FlexTouch Pen Inject 40 Units into the skin at bedtime. 15 mL 2   Insulin Pen Needle (BD PEN NEEDLE NANO U/F) 32G X 4 MM MISC 1 each by Does not apply route 4 (four) times daily. 150 each 5   loratadine (CLARITIN) 10 MG tablet TAKE 1 TABLET BY MOUTH EVERY DAY (Patient taking differently: Take 10 mg by mouth daily.) 90 tablet 3   Multiple Vitamins-Minerals (ONE-A-DAY MENS 50+) TABS Take 1 tablet by mouth daily with breakfast. Men     potassium chloride SA (KLOR-CON M20) 20 MEQ tablet Take 1.5 tablets (30 mEq total) by mouth 2 (two) times daily. Take 2 extra tabs on Mon with metolazone 100 tablet 3   spironolactone (ALDACTONE) 25 MG tablet Take 0.5 tablets (12.5 mg total) by mouth daily. 15 tablet 6   sucralfate (CARAFATE) 1 g tablet Take 1 tablet (1 g total) by mouth 4 (four) times daily. 120 tablet 1   torsemide (DEMADEX) 20 MG tablet TAKE 3 TABLETS (60 MG TOTAL) BY MOUTH 2 (TWO) TIMES DAILY. 540 tablet 1   metolazone (ZAROXOLYN) 2.5 MG tablet Take 1 tablet (2.5 mg total) by mouth once a week. Every Monday (Patient not taking: Reported on 01/09/2021) 5 tablet 1   No current facility-administered medications for this encounter.   No Known Allergies  Social History   Socioeconomic History   Marital status: Married    Spouse name: Not on file   Number of children: 2   Years of education: Not on file   Highest education level: Not on file  Occupational History   Occupation: Designer, industrial/product    Employer: RETIRED  Tobacco Use   Smoking status: Former    Packs/day: 1.00    Years: 10.00    Pack years: 10.00    Types: Cigarettes    Quit date: 03/26/1986    Years since quitting: 34.8   Smokeless tobacco: Never   Tobacco comments:    "stopped cigarette  smoking 1988"  Vaping Use   Vaping Use: Never used  Substance  and Sexual Activity   Alcohol use: Not Currently    Alcohol/week: 0.0 standard drinks    Comment: "quit alcohol ~ 2007"   Drug use: No   Sexual activity: Yes    Partners: Female  Other Topics Concern   Not on file  Social History Narrative   Not on file   Social Determinants of Health   Financial Resource Strain: Not on file  Food Insecurity: No Food Insecurity   Worried About [place] in the Last Year: Never true   College in the Last Year: Never true  Transportation Needs: No Transportation Needs   Lack of Transportation (Medical): No  Lack of Transportation (Non-Medical): No  Physical Activity: Not on file  Stress: Not on file  Social Connections: Not on file  Intimate Partner Violence: Not on file   Family History  Problem Relation Age of Onset   Heart attack Mother    Hypertension Mother    Heart attack Father    Hypertension Father    Heart attack Brother    Colon cancer Neg Hx    BP 134/66   Pulse 84   Wt 101.2 kg (223 lb)   SpO2 98%   BMI 30.24 kg/m   Wt Readings from Last 3 Encounters:  01/09/21 101.2 kg (223 lb)  01/02/21 94.8 kg (209 lb)  12/29/20 98.9 kg (218 lb)   PHYSICAL EXAM: General:  NAD. No resp difficulty HEENT: Normal Neck: Supple. JVP to jaw, +v-waves. Carotids 2+ bilat; no bruits. No lymphadenopathy or thryomegaly appreciated. Cor: PMI nondisplaced. Irregular rate & rhythm. No rubs, gallops, 2/6 TR Lungs: fine crackles RLL Abdomen: Soft, nontender, nondistended. No hepatosplenomegaly. No bruits or masses. Good bowel sounds. Extremities: No cyanosis, clubbing, rash, 1+ BLE edema Neuro: Alert & oriented x 3, cranial nerves grossly intact. Moves all 4 extremities w/o difficulty. Affect pleasant.  ECG (personally reviewed): v-paced with PACs  ASSESSMENT & PLAN:  1. Acute on Chronic Combined Systolic and Diastolic Heart Failure/NICM: - Echo 08/2015 EF 50-55% (PPM placed in 08/2015)  - Echo 10/2015 EF dropped to  40-45% - Echo 2018 EF 45% - Echo 2021 EF 30-35%, RV mildly reduced  - Echo 3/22 EF 25-30% TAVR ok. Mod MR .-LHC 9/21 that showed no coronary disease.  - based on timing of drop in EF and 42% pacing percentage, concern for RV paced CM +AS - Left subclavian vein occluded -> unable to upgrade to CRT - s/p TAVR 2/22 - Stable NYHA II-III. Volume status up in setting of being w/o metolazone for 2 weeks, weight up 13 lbs.  - Take metolazone 2.5 mg with kcl 40 today and tomorrow, then every Monday. - Continue torsemide 60 mg bid.  - Continue spiro 12.5 mg daily.  - Continue carvedilol 3.125/6.25 - Off Farxiga due to yeast. - PPM interrogated in clinic No VT. 100% AF Pacing < 10% so no need for CRT currently.  - Possible candidate for ADI study. - BMET & BNP today; repeat BMET in 1 week.  2. Mitral Regurgitation  - Moderate by TEE 8/21 and Echo 3/22 - Likely functional from dilated CM  - Will follow - Repeat echo next year.  3. Aortic Stenosis  - s/p TAVR 3/22 - Stable on echo.   4. Atrial Flutter/ Chronic Atrial Fibrillation - s/p AFL ablation.  - In chronic Afib, rate controlled  - s/p recent GI bleed so off Eliquis. Repeat EGD rescheduled due to hospitalization. If looks ok will restart.   5. Stage IIIb CKD - Followed by nephrology.  - Baseline SCr ~1.8 - Recheck labs today and in 1 week.  6. T2DM - On insulin. - Off Farxiga due to yeast.  7. H/o CHB  - s/p PPM followed by Dr. Lovena Le  - ? RV paced CM. He is now pacing < 10%. Had drop in EF 2 months after pacer was placed.  - Following with Dr. Lovena Le. Attempted CRT upgrade 3/22 but left subclavian vein occluded.  8. UGIB - EGD in 7/22 with duodenal ulcer with visible vessel. Underwent coiling of GDA.  - Due for repeat EGD soon, if improved can restart Eliquis.  Follow  up with APP in 3 weeks to reassess volume status.   John Bihari, FNP  1:56 PM

## 2021-01-09 ENCOUNTER — Ambulatory Visit (HOSPITAL_COMMUNITY)
Admission: RE | Admit: 2021-01-09 | Discharge: 2021-01-09 | Disposition: A | Discharge status: Home / Self Care | Payer: Medicare HMO | Source: Ambulatory Visit | Attending: Family Medicine | Admitting: Family Medicine

## 2021-01-09 ENCOUNTER — Other Ambulatory Visit: Payer: Self-pay

## 2021-01-09 ENCOUNTER — Encounter (HOSPITAL_COMMUNITY): Payer: Self-pay

## 2021-01-09 VITALS — BP 134/66 | HR 84 | Wt 223.0 lb

## 2021-01-09 DIAGNOSIS — Z8249 Family history of ischemic heart disease and other diseases of the circulatory system: Secondary | ICD-10-CM | POA: Insufficient documentation

## 2021-01-09 DIAGNOSIS — Z87891 Personal history of nicotine dependence: Secondary | ICD-10-CM | POA: Diagnosis not present

## 2021-01-09 DIAGNOSIS — I428 Other cardiomyopathies: Secondary | ICD-10-CM | POA: Diagnosis not present

## 2021-01-09 DIAGNOSIS — Z952 Presence of prosthetic heart valve: Secondary | ICD-10-CM

## 2021-01-09 DIAGNOSIS — Z79899 Other long term (current) drug therapy: Secondary | ICD-10-CM | POA: Diagnosis not present

## 2021-01-09 DIAGNOSIS — Z950 Presence of cardiac pacemaker: Secondary | ICD-10-CM | POA: Insufficient documentation

## 2021-01-09 DIAGNOSIS — I5043 Acute on chronic combined systolic (congestive) and diastolic (congestive) heart failure: Secondary | ICD-10-CM | POA: Insufficient documentation

## 2021-01-09 DIAGNOSIS — I482 Chronic atrial fibrillation, unspecified: Secondary | ICD-10-CM | POA: Insufficient documentation

## 2021-01-09 DIAGNOSIS — N1832 Chronic kidney disease, stage 3b: Secondary | ICD-10-CM | POA: Diagnosis not present

## 2021-01-09 DIAGNOSIS — Z8719 Personal history of other diseases of the digestive system: Secondary | ICD-10-CM | POA: Diagnosis not present

## 2021-01-09 DIAGNOSIS — Z8679 Personal history of other diseases of the circulatory system: Secondary | ICD-10-CM | POA: Diagnosis not present

## 2021-01-09 DIAGNOSIS — Z7982 Long term (current) use of aspirin: Secondary | ICD-10-CM | POA: Insufficient documentation

## 2021-01-09 DIAGNOSIS — I080 Rheumatic disorders of both mitral and aortic valves: Secondary | ICD-10-CM | POA: Diagnosis not present

## 2021-01-09 DIAGNOSIS — I4892 Unspecified atrial flutter: Secondary | ICD-10-CM | POA: Diagnosis not present

## 2021-01-09 DIAGNOSIS — E119 Type 2 diabetes mellitus without complications: Secondary | ICD-10-CM

## 2021-01-09 DIAGNOSIS — I4819 Other persistent atrial fibrillation: Secondary | ICD-10-CM | POA: Diagnosis not present

## 2021-01-09 DIAGNOSIS — I5022 Chronic systolic (congestive) heart failure: Secondary | ICD-10-CM

## 2021-01-09 DIAGNOSIS — E1122 Type 2 diabetes mellitus with diabetic chronic kidney disease: Secondary | ICD-10-CM | POA: Diagnosis not present

## 2021-01-09 DIAGNOSIS — Z794 Long term (current) use of insulin: Secondary | ICD-10-CM

## 2021-01-09 DIAGNOSIS — I340 Nonrheumatic mitral (valve) insufficiency: Secondary | ICD-10-CM | POA: Diagnosis not present

## 2021-01-09 DIAGNOSIS — I130 Hypertensive heart and chronic kidney disease with heart failure and stage 1 through stage 4 chronic kidney disease, or unspecified chronic kidney disease: Secondary | ICD-10-CM | POA: Insufficient documentation

## 2021-01-09 DIAGNOSIS — Z7985 Long-term (current) use of injectable non-insulin antidiabetic drugs: Secondary | ICD-10-CM | POA: Diagnosis not present

## 2021-01-09 LAB — BASIC METABOLIC PANEL
Anion gap: 8 (ref 5–15)
BUN: 59 mg/dL — ABNORMAL HIGH (ref 8–23)
CO2: 24 mmol/L (ref 22–32)
Calcium: 8.7 mg/dL — ABNORMAL LOW (ref 8.9–10.3)
Chloride: 103 mmol/L (ref 98–111)
Creatinine, Ser: 2 mg/dL — ABNORMAL HIGH (ref 0.61–1.24)
GFR, Estimated: 34 mL/min — ABNORMAL LOW (ref >60)
Glucose, Bld: 144 mg/dL — ABNORMAL HIGH (ref 70–99)
Potassium: 4.1 mmol/L (ref 3.5–5.1)
Sodium: 135 mmol/L (ref 135–145)

## 2021-01-09 LAB — BRAIN NATRIURETIC PEPTIDE: B Natriuretic Peptide: 1835.6 pg/mL — ABNORMAL HIGH (ref 0.0–100.0)

## 2021-01-09 MED ORDER — METOLAZONE 2.5 MG PO TABS: 2.5000 mg | ORAL_TABLET | ORAL | 1 refills | Status: DC

## 2021-01-09 NOTE — Patient Instructions (Addendum)
Labs were done today, if any labs are abnormal the clinic will call you  EKG was done today  TAKE Metolazone 2.5 mg with 40 meq of potassium TODAY and TOMORROW ONLY THEN every Monday take 2.5 mg with 40 meq of Potassium  Your physician recommends that you schedule a follow-up appointment in: 3 weeks  Your physician recommends that you return for lab work in: 1 week  At the [facility], you and your health needs are our priority. As part of our continuing mission to provide you with exceptional heart care, we have created designated Provider Care Teams. These Care Teams include your primary Cardiologist (physician) and Advanced Practice Providers (APPs- Physician Assistants and Nurse Practitioners) who all work together to provide you with the care you need, when you need it.   You may see any of the following providers on your designated Care Team at your next follow up: Dr Glori Bickers Dr Haynes Kerns, NP Lyda Jester, [place] Audry Riles, PharmD   Please be sure to bring in all your medications bottles to every appointment.    If you have any questions or concerns before your next appointment please send [place] a message through [place] or call our office at [phone].    TO LEAVE A MESSAGE FOR THE NURSE SELECT OPTION 2, PLEASE LEAVE A MESSAGE INCLUDING: YOUR NAME DATE OF BIRTH CALL BACK NUMBER REASON FOR CALL**this is important as we prioritize the call backs  YOU WILL RECEIVE A CALL BACK THE SAME DAY AS LONG AS YOU CALL BEFORE 4:00 PM

## 2021-01-12 ENCOUNTER — Ambulatory Visit (INDEPENDENT_AMBULATORY_CARE_PROVIDER_SITE_OTHER): Payer: Medicare HMO

## 2021-01-12 DIAGNOSIS — I5022 Chronic systolic (congestive) heart failure: Secondary | ICD-10-CM

## 2021-01-13 DIAGNOSIS — R809 Proteinuria, unspecified: Secondary | ICD-10-CM | POA: Diagnosis not present

## 2021-01-13 DIAGNOSIS — E1122 Type 2 diabetes mellitus with diabetic chronic kidney disease: Secondary | ICD-10-CM | POA: Diagnosis not present

## 2021-01-13 DIAGNOSIS — D649 Anemia, unspecified: Secondary | ICD-10-CM | POA: Diagnosis not present

## 2021-01-13 DIAGNOSIS — I5022 Chronic systolic (congestive) heart failure: Secondary | ICD-10-CM | POA: Diagnosis not present

## 2021-01-13 DIAGNOSIS — E1129 Type 2 diabetes mellitus with other diabetic kidney complication: Secondary | ICD-10-CM | POA: Diagnosis not present

## 2021-01-13 DIAGNOSIS — N189 Chronic kidney disease, unspecified: Secondary | ICD-10-CM | POA: Diagnosis not present

## 2021-01-13 DIAGNOSIS — I129 Hypertensive chronic kidney disease with stage 1 through stage 4 chronic kidney disease, or unspecified chronic kidney disease: Secondary | ICD-10-CM | POA: Diagnosis not present

## 2021-01-15 LAB — CUP PACEART REMOTE DEVICE CHECK
Battery Impedance: 451 Ohm
Battery Remaining Longevity: 79 mo
Battery Voltage: 2.78 V
Brady Statistic AP VP Percent: 3 %
Brady Statistic AP VS Percent: 0 %
Brady Statistic AS VP Percent: 6 %
Brady Statistic AS VS Percent: 90 %
Date Time Interrogation Session: 20221021122200
Implantable Lead Implant Date: 20170605
Implantable Lead Implant Date: 20170605
Implantable Lead Location: 753859
Implantable Lead Location: 753860
Implantable Lead Model: 5076
Implantable Lead Model: 5076
Implantable Pulse Generator Implant Date: 20170605
Lead Channel Impedance Value: 365 Ohm
Lead Channel Impedance Value: 472 Ohm
Lead Channel Pacing Threshold Amplitude: 0.625 V
Lead Channel Pacing Threshold Amplitude: 0.875 V
Lead Channel Pacing Threshold Pulse Width: 0.4 ms
Lead Channel Pacing Threshold Pulse Width: 0.4 ms
Lead Channel Setting Pacing Amplitude: 2 V
Lead Channel Setting Pacing Amplitude: 2.5 V
Lead Channel Setting Pacing Pulse Width: 0.4 ms
Lead Channel Setting Sensing Sensitivity: 4 mV

## 2021-01-17 ENCOUNTER — Other Ambulatory Visit (HOSPITAL_COMMUNITY): Payer: Self-pay | Admitting: Family Medicine

## 2021-01-17 ENCOUNTER — Other Ambulatory Visit (HOSPITAL_COMMUNITY): Payer: Self-pay | Admitting: *Deleted

## 2021-01-17 DIAGNOSIS — I5032 Chronic diastolic (congestive) heart failure: Secondary | ICD-10-CM | POA: Diagnosis not present

## 2021-01-17 MED ORDER — METOLAZONE 2.5 MG PO TABS: 2.5000 mg | ORAL_TABLET | ORAL | 6 refills | Status: DC

## 2021-01-18 LAB — BASIC METABOLIC PANEL
BUN/Creatinine Ratio: 28 — ABNORMAL HIGH (ref 10–24)
BUN: 58 mg/dL — ABNORMAL HIGH (ref 8–27)
CO2: 23 mmol/L (ref 20–29)
Calcium: 9.3 mg/dL (ref 8.6–10.2)
Chloride: 100 mmol/L (ref 96–106)
Creatinine, Ser: 2.08 mg/dL — ABNORMAL HIGH (ref 0.76–1.27)
Glucose: 67 mg/dL — ABNORMAL LOW (ref 70–99)
Potassium: 4.5 mmol/L (ref 3.5–5.2)
Sodium: 138 mmol/L (ref 134–144)
eGFR: 33 mL/min/{1.73_m2} — ABNORMAL LOW (ref >59)

## 2021-01-18 LAB — SPECIMEN STATUS REPORT

## 2021-01-19 ENCOUNTER — Ambulatory Visit (INDEPENDENT_AMBULATORY_CARE_PROVIDER_SITE_OTHER): Payer: Medicare HMO | Admitting: Gastroenterology

## 2021-01-19 ENCOUNTER — Encounter (INDEPENDENT_AMBULATORY_CARE_PROVIDER_SITE_OTHER): Payer: Self-pay | Admitting: Gastroenterology

## 2021-01-19 ENCOUNTER — Other Ambulatory Visit: Payer: Self-pay

## 2021-01-19 VITALS — BP 101/65 | HR 78 | Temp 97.7°F | Ht 72.0 in | Wt 215.0 lb

## 2021-01-19 DIAGNOSIS — K269 Duodenal ulcer, unspecified as acute or chronic, without hemorrhage or perforation: Secondary | ICD-10-CM | POA: Diagnosis not present

## 2021-01-19 DIAGNOSIS — K5900 Constipation, unspecified: Secondary | ICD-10-CM

## 2021-01-19 DIAGNOSIS — R7989 Other specified abnormal findings of blood chemistry: Secondary | ICD-10-CM

## 2021-01-19 DIAGNOSIS — K7581 Nonalcoholic steatohepatitis (NASH): Secondary | ICD-10-CM | POA: Diagnosis not present

## 2021-01-19 NOTE — Progress Notes (Signed)
John [name], M.D. Gastroenterology & Hepatology [facility] [address] [place], [place] [address]  Primary Care Physician: [name], MD [place] [id number]  I will communicate my assessment and recommendations to the referring MD via EMR.  Problems: Duodenal ulcer status post GDA embolization NASH, ?early cirrhosis Congestive hepatopathy  History of Present Illness: John [name] is a 75 y.o. male PMH afib, CHF, DM on insulin, HTN, HLD, NASH, CKD, gout, large cratered duodenal ulcer, who presents for follow up of duodenal ulcer.  The patient was last seen on 11/22/2020. At that time, the patient was ordered to have repeat EGD in October to follow-up on duodenal ulcer.  He was continued on Protonix twice daily.  However, the patient had a hospitalization at [facility] on 12/30/2020 after being found to have acute on chronic systolic heart failure.  He was discharged on a new diuretic regimen and his esophagogastroduodenospy was canceled.  States that after after he left the hospital he has felt better. Reports that he has presented some shortness of breath when walking but no chest pain. He was started on metolazone 2.5 mg every week, currently being followed by the CHF clinic.  Noticed that after he was discharged from the hospital he developed worsening constipation characterized by straining when having a BM, but has a BM daily. He has been taking Miralax every day. Appetite has decreased after he was discharged from the hospital.  He otherwise denies having any other complaints such as nausea, vomiting, fever, chills, hematochezia, melena, hematemesis, abdominal distention, abdominal pain, diarrhea, jaundice, pruritus or weight loss.  Most recent hemoglobin prior to being discharged from the hospital was 9.1 on 12/31/2020.  His MCV was above 80.  In terms of his liver function test, his most  recent LFTs from 12/31/2020 showed an alkaline phosphatase of 207, total bilirubin of 2.3, AST of 33, ALT of 17.  Last Colonoscopy:(2019) 3 polyps in [facility], diverticulosis, Advised to repeat in 5 years. Last Endoscopy:(10/18/20) normal esophagus, erythematous mucosa in gastric body, non bleeding duodenal ulcer with a non bleeding visible vessel (forrest Class IIa). Injected, clip placed, no specimens collected.   Past Medical History: Past Medical History:  Diagnosis Date   Atrial flutter ([place]) 12/2010   Admitted with symptomatic bradycardia (HR 40s), atrial flutter with slow ventricular response 12/2010 + volume overload; AV nodal agents d/c'd and Pradaxa started; RFA in 01/2011   Chronic kidney disease, stage 3b (HCC)    Chronic systolic CHF (congestive heart failure) (HCC)    Class 2 severe obesity due to excess calories with serious comorbidity and body mass index (BMI) of 37.0 to 37.9 in adult ([place]) 07/17/2017   Diabetes mellitus    non insulin dependant   Hematoma, chest wall 05/15/2020   Hyperlipidemia    Hypertension    Mitral regurgitation    NASH (nonalcoholic steatohepatitis)    NICM (nonischemic cardiomyopathy) (HCC)    Osteoarthritis    Polyarticular gout 05/15/2020   Presence of permanent cardiac pacemaker    PSVT (paroxysmal supraventricular tachycardia) (HCC)    Possibly atrial flutter   S/P TAVR (transcatheter aortic valve replacement) 05/03/2020   s/p TAVR with a 29 mm Edwards S3U via the subclavian approach with Dr. Angelena Form & Dr. Cyndia Bent    Severe aortic stenosis     Past Surgical History: Past Surgical History:  Procedure Laterality Date   ATRIAL FLUTTER ABLATION N/A 02/07/2011   Procedure:  ATRIAL FLUTTER ABLATION;  Surgeon: Evans Lance, MD;  Location: [facility] CATH LAB;  Service: Cardiovascular;  Laterality: N/A;   BIV UPGRADE N/A 06/08/2020   Procedure: BIV PPM UPGRADE;  Surgeon: Evans Lance, MD;  Location: [place] CV LAB;  Service: Cardiovascular;   Laterality: N/A;   CARDIAC ELECTROPHYSIOLOGY STUDY AND ABLATION  02/07/2011   CARDIOVERSION N/A 03/23/2016   Procedure: CARDIOVERSION;  Surgeon: Evans Lance, MD;  Location: [place];  Service: Cardiovascular;  Laterality: N/A;   COLONOSCOPY N/A 04/17/2017   [place]: 3 polyps in [facility], diverticulosis   EP IMPLANTABLE DEVICE N/A 08/29/2015   Procedure: Pacemaker Implant;  Surgeon: Evans Lance, MD;  Location: [place] CV LAB;  Service: Cardiovascular;  Laterality: N/A;   EP IMPLANTABLE DEVICE N/A 12/07/2015   Procedure: PPM Lead Revision/Repair;  Surgeon: Evans Lance, MD;  Location: [place] CV LAB;  Service: Cardiovascular;  Laterality: N/A;   ESOPHAGOGASTRODUODENOSCOPY (EGD) WITH PROPOFOL N/A 10/18/2020   [place]: normal esophagus, erythematous mucosa in gastric body, non bleeding duodenal ulcer with a non bleeding visible vessel (forrest Class IIa). Injected, clip placed, no specimens collected.   IR ANGIOGRAM SELECTIVE EACH ADDITIONAL VESSEL  10/19/2020   IR ANGIOGRAM SELECTIVE EACH ADDITIONAL VESSEL  10/19/2020   IR ANGIOGRAM VISCERAL SELECTIVE  10/19/2020   IR ANGIOGRAM VISCERAL SELECTIVE  10/19/2020   IR EMBO ART  VEN HEMORR LYMPH EXTRAV  INC GUIDE ROADMAPPING  10/19/2020   IR US GUIDE VASC ACCESS RIGHT  10/19/2020   KNEE ARTHROSCOPY  03/27/2003   left   LUMBAR SPINE SURGERY     "I've had 6 ORs 1972 thru 2004"   POLYPECTOMY  04/17/2017   Procedure: POLYPECTOMY;  Surgeon: Rogene Houston, MD;  [facility];  Service: Endoscopy;;  transverse colon x3;   RIGHT HEART CATH N/A 04/28/2020   Procedure: RIGHT HEART CATH;  Surgeon: Jolaine Artist, MD;  Location: [place] CV LAB;  Service: Cardiovascular;  Laterality: N/A;   RIGHT/LEFT HEART CATH AND CORONARY ANGIOGRAPHY N/A 12/21/2019   Procedure: RIGHT/LEFT HEART CATH AND CORONARY ANGIOGRAPHY;  Surgeon: Nelva Bush, MD;  Location: [place] CV LAB;  Service: Cardiovascular;  Laterality: N/A;   TEE  WITHOUT CARDIOVERSION N/A 11/05/2019   Procedure: TRANSESOPHAGEAL ECHOCARDIOGRAM (TEE) WITH PROPOFOL;  Surgeon: Arnoldo Lenis, MD;  [facility];  Service: Endoscopy;  Laterality: N/A;   TEE WITHOUT CARDIOVERSION N/A 05/03/2020   Procedure: TRANSESOPHAGEAL ECHOCARDIOGRAM (TEE);  Surgeon: Burnell Blanks, MD;  Location: [place];  Service: Open Heart Surgery;  Laterality: N/A;    Family History: Family History  Problem Relation Age of Onset   Heart attack Mother    Hypertension Mother    Heart attack Father    Hypertension Father    Heart attack Brother    Colon cancer Neg Hx     Social History: Social History   Tobacco Use  Smoking Status Former   Packs/day: 1.00   Years: 10.00   Pack years: 10.00   Types: Cigarettes   Quit date: 03/26/1986   Years since quitting: 34.8  Smokeless Tobacco Never  Tobacco Comments   "stopped cigarette  smoking 1988"   Social History   Substance and Sexual Activity  Alcohol Use Not Currently   Alcohol/week: 0.0 standard drinks   Comment: "quit alcohol ~ 2007"   Social History   Substance and Sexual Activity  Drug Use No    Allergies: No Known Allergies  Medications: Current Outpatient Medications  Medication Sig Dispense Refill   ACCU-CHEK AVIVA PLUS test strip TEST BLOOD SUGAR TWICE DAILY BEFORE BREAKFAST AND AT BEDTIME 200 strip 1   acetaminophen (TYLENOL) 325 MG tablet Take 2 tablets (650 mg total) by mouth every 4 (four) hours as needed for headache or mild pain.     allopurinol (ZYLOPRIM) 300 MG tablet Take 0.5 tablets (150 mg total) by mouth daily. (Patient taking differently: Take 150 mg by mouth as needed.) 15 tablet 6   aspirin EC 81 MG tablet Take 1 tablet (81 mg total) by mouth daily with breakfast. 30 tablet 2   benzonatate (TESSALON) 100 MG capsule Take 1 capsule (100 mg total) by mouth 3 (three) times daily as needed for cough. 90 capsule 11   camphor-menthol (ANTI-ITCH) lotion Apply 1 application  topically daily as needed for itching.     carvedilol (COREG) 3.125 MG tablet TAKE 1 TABLET IN THE MORNING AND 2 TABLETS IN THE EVENING 270 tablet 6   colchicine 0.6 MG tablet Take 1 tablet (0.6 mg total) by mouth daily as needed. 30 tablet 6   Dulaglutide (TRULICITY) 1.5 PJ/8.2NK SOPN INJECT 1.5MG (1 PEN) SUBCUTANEOUSLY EVERY WEEK (Patient taking differently: Inject 1.5 mg into the skin once a week. Sunday) 6 mL 1   insulin degludec (TRESIBA FLEXTOUCH) 100 UNIT/ML FlexTouch Pen Inject 40 Units into the skin at bedtime. 15 mL 2   Insulin Pen Needle (BD PEN NEEDLE NANO U/F) 32G X 4 MM MISC 1 each by Does not apply route 4 (four) times daily. 150 each 5   loratadine (CLARITIN) 10 MG tablet TAKE 1 TABLET BY MOUTH EVERY DAY (Patient taking differently: Take 10 mg by mouth daily.) 90 tablet 3   metolazone (ZAROXOLYN) 2.5 MG tablet Take 1 tablet (2.5 mg total) by mouth once a week. Every Monday 5 tablet 6   Multiple Vitamins-Minerals (ONE-A-DAY MENS 50+) TABS Take 1 tablet by mouth daily with breakfast. Men     potassium chloride SA (KLOR-CON M20) 20 MEQ tablet Take 1.5 tablets (30 mEq total) by mouth 2 (two) times daily. Take 2 extra tabs on Mon with metolazone 100 tablet 3   spironolactone (ALDACTONE) 25 MG tablet Take 0.5 tablets (12.5 mg total) by mouth daily. 15 tablet 6   sucralfate (CARAFATE) 1 g tablet Take 1 tablet (1 g total) by mouth 4 (four) times daily. 120 tablet 1   torsemide (DEMADEX) 20 MG tablet TAKE 3 TABLETS (60 MG TOTAL) BY MOUTH 2 (TWO) TIMES DAILY. 540 tablet 1   No current facility-administered medications for this visit.    Review of Systems: GENERAL: negative for malaise, night sweats HEENT: No changes in hearing or vision, no nose bleeds or other nasal problems. NECK: Negative for lumps, goiter, pain and significant neck swelling RESPIRATORY: Negative for cough, wheezing CARDIOVASCULAR: Negative for chest pain, leg swelling, palpitations, orthopnea GI: SEE  HPI MUSCULOSKELETAL: Negative for joint pain or swelling, back pain, and muscle pain. SKIN: Negative for lesions, rash PSYCH: Negative for sleep disturbance, mood disorder and recent psychosocial stressors. HEMATOLOGY Negative for prolonged bleeding, bruising easily, and swollen nodes. ENDOCRINE: Negative for cold or heat intolerance, polyuria, polydipsia and goiter. NEURO: negative for tremor, gait imbalance, syncope and seizures. The remainder of the review of systems is noncontributory.   Physical Exam: BP 101/65 (BP Location: Left Arm, Patient Position: Sitting, Cuff Size: Large)   Pulse 78   Temp 97.7 F (36.5 C) (Oral)   Ht 6' (1.829 m)   Wt 215 lb (  97.5 kg)   BMI 29.16 kg/m  GENERAL: The patient is AO x3, in no acute distress. Uses a cane HEENT: Head is normocephalic and atraumatic. EOMI are intact. Mouth is well hydrated and without lesions. NECK: Supple. No masses LUNGS: Clear to auscultation. No presence of rhonchi/wheezing/rales. Adequate chest expansion HEART: RRR, normal s1 and s2. ABDOMEN: Soft, nontender, no guarding, no peritoneal signs, and nondistended. BS +. No masses. EXTREMITIES: Without any cyanosis, clubbing, rash, lesions or edema. NEUROLOGIC: AOx3, no focal motor deficit. SKIN: no jaundice, no rashes  Imaging/Labs: as above  I personally reviewed and interpreted the available labs, imaging and endoscopic files.  Impression and Plan: John [name] is a 75 y.o. male PMH afib , CHF, DM on insulin, HTN, HLD, NASH, CKD, gout, large cratered duodenal ulcer, who presents for follow up of duodenal ulcer.  The patient had complicated course after being found to have Forrest IIa ulcer in his duodenal bulb which required to have GDA embolization by interventional radiology.  Since then he has not presented any more episodes of clinical bleeding and his hemoglobin has remained relatively stable without presence of microcytosis.  He was supposed to get a repeat EGD  for surveillance given the size of his ulcer but he had exacerbation of his heart failure and this was postponed.  He has presented some improvement in his symptoms after the hospitalization.  We will recheck his cardiologist to obtain clearance from the renal standpoint to proceed with repeat EGD.  This will also help to determine if he has any high risk stigmata prior to restarting his Eliquis.  The patient understood and agreed with this.  It is possible that as he has been having more diuresis with his new diuretic regimen, he is presenting worsening constipation characterized by hard stools.  I advised him to increase the intake of MiraLAX as this can help improving his bowel movement frequency.  Regarding his liver, he had a thorough work-up in the past that did not show any major abnormalities.  It is questionable whether he has liver cirrhosis or not at this moment but we will need to follow him closely with consideration for repeat elastography in 6 months.  Even though he has presented some pruritus, I do not consider there is a possibility this is related to [place] as his alkaline phosphatase is not severely elevated and he had negative antimitochondrial antibodies.  It is likely that the elevation of his liver enzymes is related to some component of congestive hepatopathy with very minimal elevation of his aminotransferases.  - Increase Miralax 2 capfuls every day. If after two weeks there is no improvement, increase to 3 capfuls - Will discuss case with cardiologist if patient is cleared for EGD  - RTC 6 months with repeat LFTs and liver elastography  All questions were answered.      Harvel Quale, MD Gastroenterology and Hepatology [facility]

## 2021-01-19 NOTE — Patient Instructions (Signed)
Increase Miralax 2 capfuls every day. If after two weeks there is no improvement, increase to 3 capfuls Will discuss case with cardiologist to determine if patient is cleared for EGD

## 2021-01-20 NOTE — Progress Notes (Signed)
Remote pacemaker transmission.   

## 2021-01-23 DIAGNOSIS — Z950 Presence of cardiac pacemaker: Secondary | ICD-10-CM | POA: Diagnosis not present

## 2021-01-23 DIAGNOSIS — K269 Duodenal ulcer, unspecified as acute or chronic, without hemorrhage or perforation: Secondary | ICD-10-CM | POA: Diagnosis not present

## 2021-01-23 DIAGNOSIS — N1832 Chronic kidney disease, stage 3b: Secondary | ICD-10-CM | POA: Diagnosis not present

## 2021-01-23 DIAGNOSIS — E1165 Type 2 diabetes mellitus with hyperglycemia: Secondary | ICD-10-CM | POA: Diagnosis not present

## 2021-01-23 DIAGNOSIS — J302 Other seasonal allergic rhinitis: Secondary | ICD-10-CM | POA: Diagnosis not present

## 2021-01-23 DIAGNOSIS — K219 Gastro-esophageal reflux disease without esophagitis: Secondary | ICD-10-CM | POA: Diagnosis not present

## 2021-01-23 DIAGNOSIS — I509 Heart failure, unspecified: Secondary | ICD-10-CM | POA: Diagnosis not present

## 2021-01-23 DIAGNOSIS — E211 Secondary hyperparathyroidism, not elsewhere classified: Secondary | ICD-10-CM | POA: Diagnosis not present

## 2021-01-23 DIAGNOSIS — M109 Gout, unspecified: Secondary | ICD-10-CM | POA: Diagnosis not present

## 2021-01-23 IMAGING — DX CHEST - 2 VIEW
2 series · 2 of 2 positions shown · non-contrast
Comparison: August 13, 2018 and February 25, 2018

CLINICAL DATA: Pain.  Cardiac arrhythmia

EXAM:
CHEST - 2 VIEW

[chest pa]
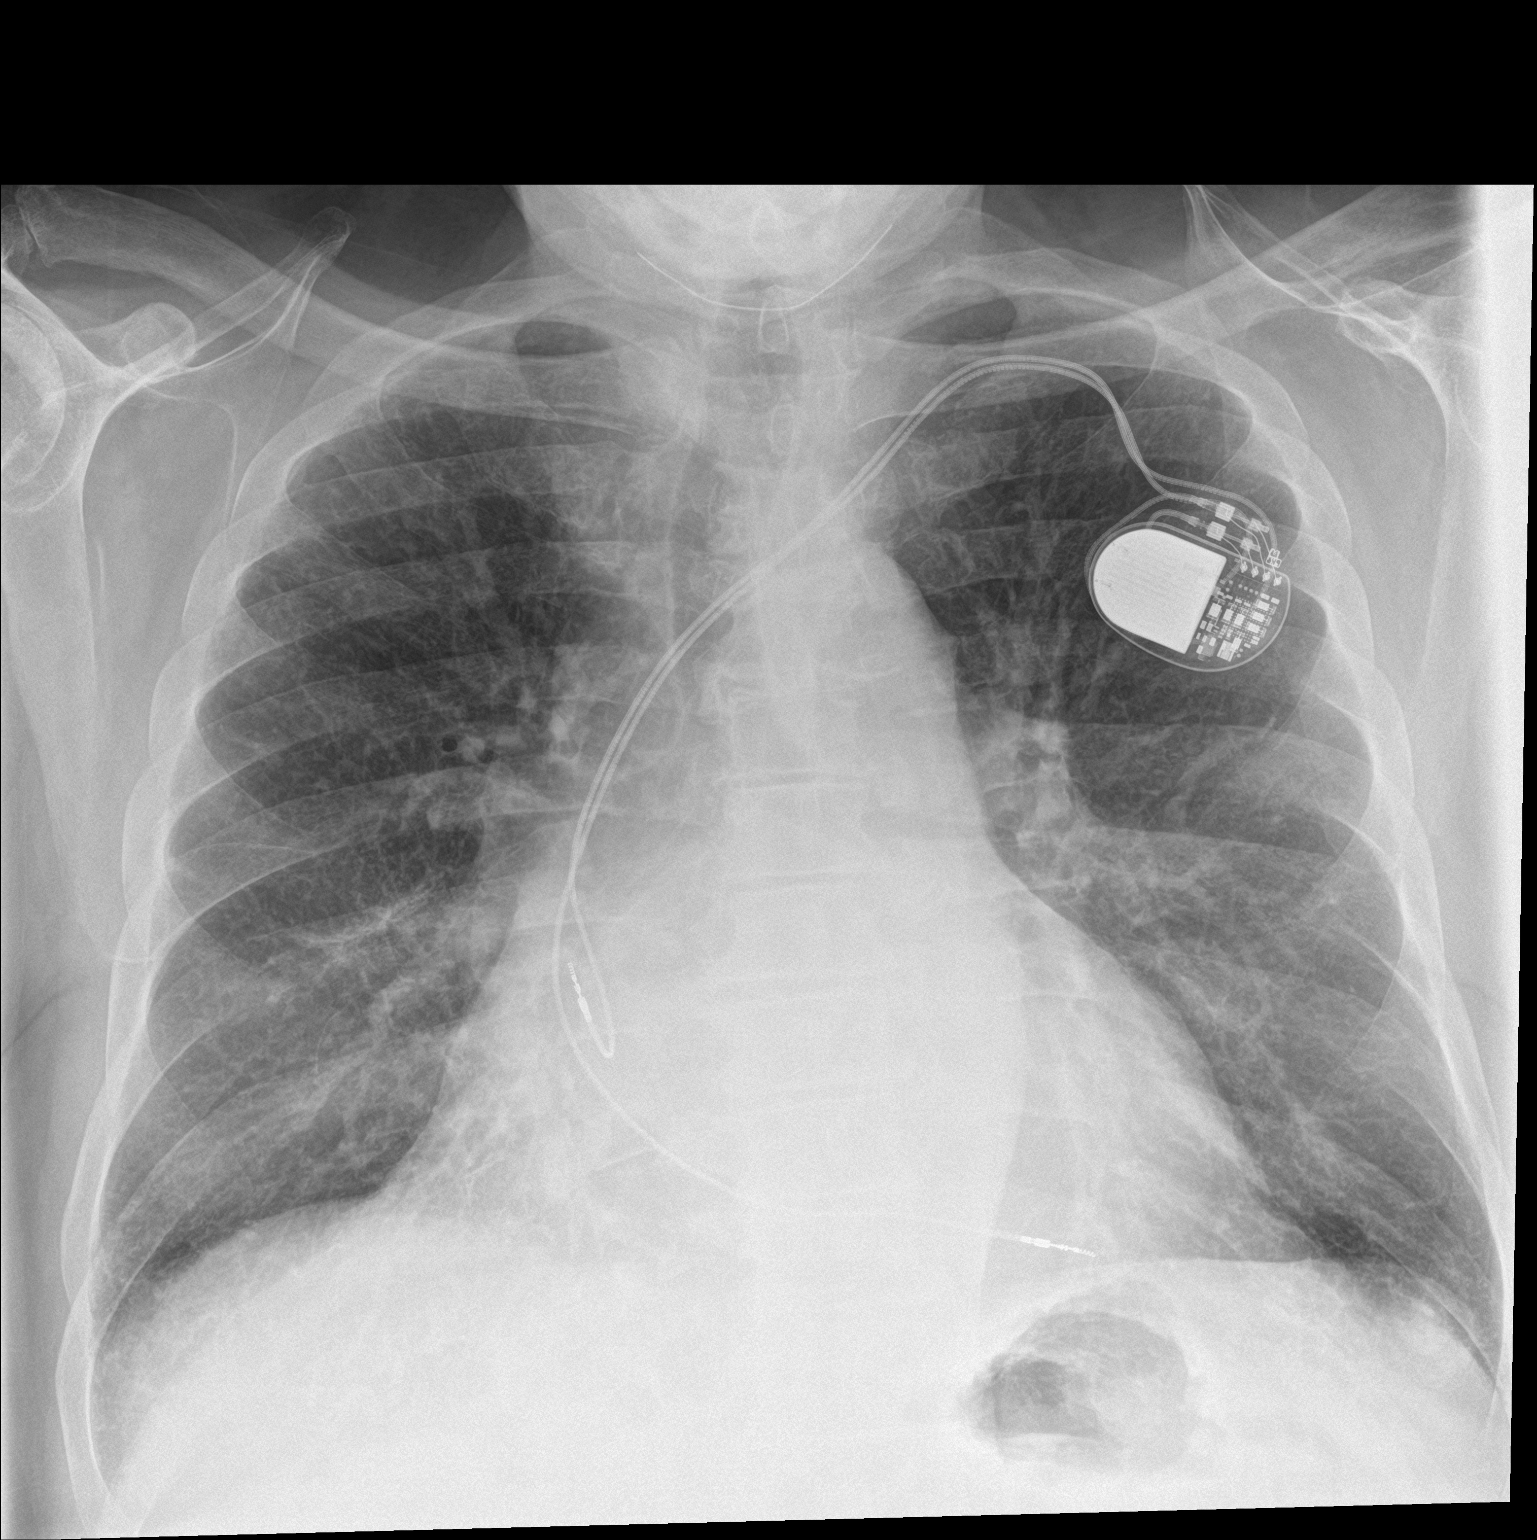

[chest lat]
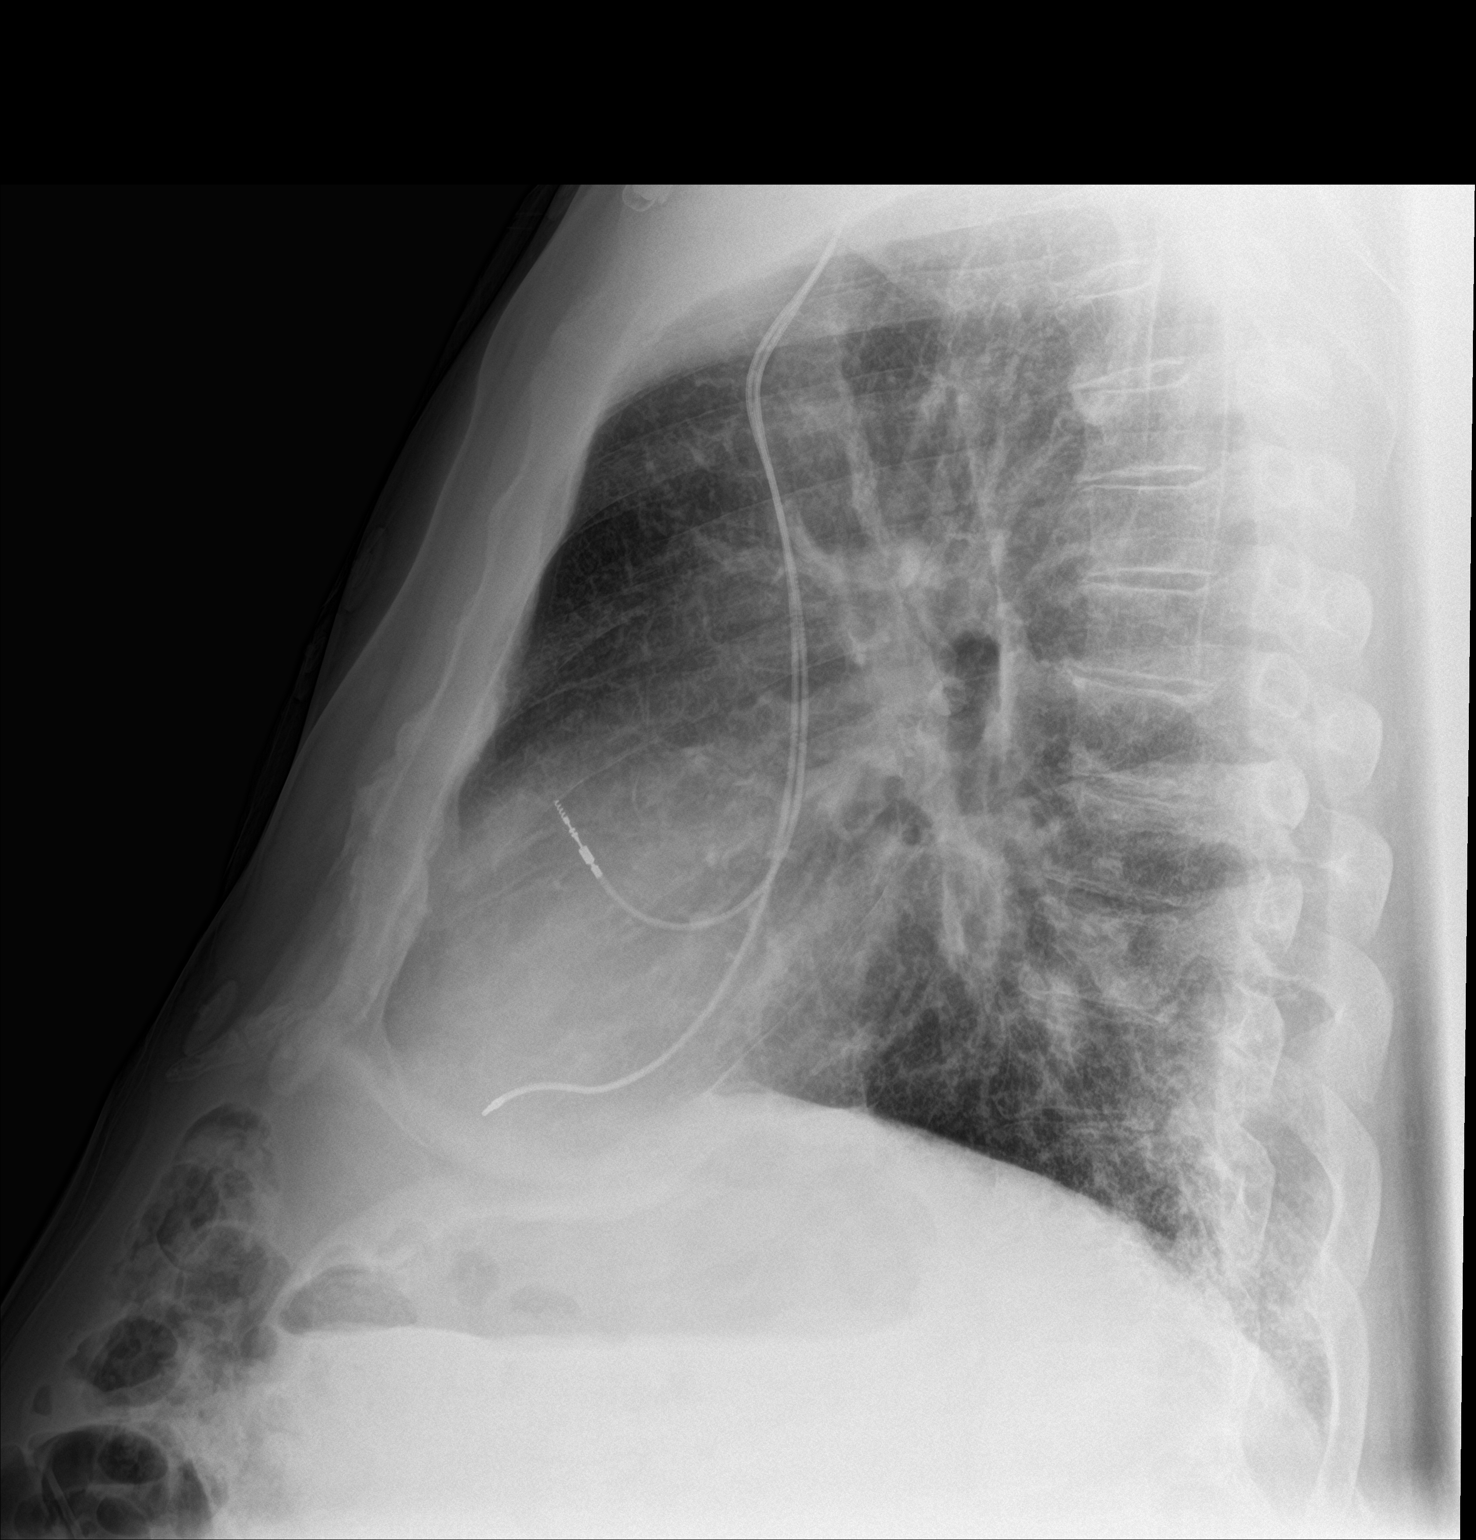

[2 of 2 positions shown; findings below may reference images not displayed]

FINDINGS: There is mild scarring in the right mid lung and basilar regions.
There is interstitial prominence with suspected mild bibasilar
interstitial edema. There is no consolidation. Heart is mildly
enlarged with pacemaker leads attached to the right atrium and right
ventricle. No adenopathy. There is aortic atherosclerosis. No bone
lesions.
IMPRESSION: Areas of mild scarring. Interstitial thickening may represent mild
chronic interstitial edema. No consolidation. Stable cardiomegaly.
Pacemaker leads attached to right atrium and right ventricle. No
adenopathy. Aortic Atherosclerosis ([id number]).

## 2021-01-24 ENCOUNTER — Telehealth: Payer: Self-pay | Admitting: *Deleted

## 2021-01-24 NOTE — Chronic Care Management (AMB) (Signed)
  Care Management   Note  01/24/2021 Name: MICHAELJOSEPH REVOLORIO MRN: [id number] DOB: 02/24/1946  BROWNING SOUTHWOOD is a 75 y.o. year old male who is a primary care patient of Nevada Crane, Edwinna Areola, MD and is actively engaged with the care management team. I reached out to [name] by phone today to assist with re-scheduling a follow up visit with the RN Case Manager  Follow up plan: Patient has changed PCP to Dr. Nevada Crane. Patient declines further follow up and engagement by the care management team. Appropriate care team members and provider have been notified via electronic communication.   [place] Management  Direct Dial: [phone]

## 2021-01-25 ENCOUNTER — Telehealth: Payer: Medicare HMO

## 2021-01-26 ENCOUNTER — Telehealth (INDEPENDENT_AMBULATORY_CARE_PROVIDER_SITE_OTHER): Payer: Self-pay | Admitting: Gastroenterology

## 2021-01-26 ENCOUNTER — Inpatient Hospital Stay (HOSPITAL_COMMUNITY)
Admission: EM | Admit: 2021-01-26 | Discharge: 2021-01-29 | DRG: 291 | Disposition: A | Discharge status: Home / Self Care | Payer: Medicare HMO | Attending: Internal Medicine | Admitting: Internal Medicine

## 2021-01-26 ENCOUNTER — Encounter (HOSPITAL_COMMUNITY): Payer: Self-pay | Admitting: *Deleted

## 2021-01-26 ENCOUNTER — Emergency Department (HOSPITAL_COMMUNITY): Payer: Medicare HMO

## 2021-01-26 ENCOUNTER — Other Ambulatory Visit (INDEPENDENT_AMBULATORY_CARE_PROVIDER_SITE_OTHER): Payer: Self-pay | Admitting: Gastroenterology

## 2021-01-26 ENCOUNTER — Other Ambulatory Visit: Payer: Self-pay

## 2021-01-26 DIAGNOSIS — K5732 Diverticulitis of large intestine without perforation or abscess without bleeding: Secondary | ICD-10-CM | POA: Diagnosis present

## 2021-01-26 DIAGNOSIS — Z8249 Family history of ischemic heart disease and other diseases of the circulatory system: Secondary | ICD-10-CM

## 2021-01-26 DIAGNOSIS — K222 Esophageal obstruction: Secondary | ICD-10-CM | POA: Diagnosis not present

## 2021-01-26 DIAGNOSIS — I5022 Chronic systolic (congestive) heart failure: Secondary | ICD-10-CM

## 2021-01-26 DIAGNOSIS — E1122 Type 2 diabetes mellitus with diabetic chronic kidney disease: Secondary | ICD-10-CM | POA: Diagnosis present

## 2021-01-26 DIAGNOSIS — K5792 Diverticulitis of intestine, part unspecified, without perforation or abscess without bleeding: Secondary | ICD-10-CM | POA: Diagnosis not present

## 2021-01-26 DIAGNOSIS — I482 Chronic atrial fibrillation, unspecified: Secondary | ICD-10-CM

## 2021-01-26 DIAGNOSIS — K5900 Constipation, unspecified: Secondary | ICD-10-CM | POA: Diagnosis present

## 2021-01-26 DIAGNOSIS — Z79899 Other long term (current) drug therapy: Secondary | ICD-10-CM

## 2021-01-26 DIAGNOSIS — I480 Paroxysmal atrial fibrillation: Secondary | ICD-10-CM | POA: Diagnosis present

## 2021-01-26 DIAGNOSIS — I5043 Acute on chronic combined systolic (congestive) and diastolic (congestive) heart failure: Secondary | ICD-10-CM | POA: Diagnosis present

## 2021-01-26 DIAGNOSIS — I4819 Other persistent atrial fibrillation: Secondary | ICD-10-CM | POA: Diagnosis present

## 2021-01-26 DIAGNOSIS — Z20822 Contact with and (suspected) exposure to covid-19: Secondary | ICD-10-CM | POA: Diagnosis present

## 2021-01-26 DIAGNOSIS — K5730 Diverticulosis of large intestine without perforation or abscess without bleeding: Secondary | ICD-10-CM | POA: Diagnosis not present

## 2021-01-26 DIAGNOSIS — Z66 Do not resuscitate: Secondary | ICD-10-CM | POA: Diagnosis not present

## 2021-01-26 DIAGNOSIS — Z7985 Long-term (current) use of injectable non-insulin antidiabetic drugs: Secondary | ICD-10-CM

## 2021-01-26 DIAGNOSIS — N179 Acute kidney failure, unspecified: Secondary | ICD-10-CM

## 2021-01-26 DIAGNOSIS — K3189 Other diseases of stomach and duodenum: Secondary | ICD-10-CM | POA: Diagnosis present

## 2021-01-26 DIAGNOSIS — I4892 Unspecified atrial flutter: Secondary | ICD-10-CM | POA: Diagnosis present

## 2021-01-26 DIAGNOSIS — K7581 Nonalcoholic steatohepatitis (NASH): Secondary | ICD-10-CM

## 2021-01-26 DIAGNOSIS — R112 Nausea with vomiting, unspecified: Secondary | ICD-10-CM

## 2021-01-26 DIAGNOSIS — Z87891 Personal history of nicotine dependence: Secondary | ICD-10-CM

## 2021-01-26 DIAGNOSIS — K8020 Calculus of gallbladder without cholecystitis without obstruction: Secondary | ICD-10-CM | POA: Diagnosis not present

## 2021-01-26 DIAGNOSIS — D62 Acute posthemorrhagic anemia: Secondary | ICD-10-CM | POA: Diagnosis present

## 2021-01-26 DIAGNOSIS — E782 Mixed hyperlipidemia: Secondary | ICD-10-CM | POA: Diagnosis not present

## 2021-01-26 DIAGNOSIS — E1165 Type 2 diabetes mellitus with hyperglycemia: Secondary | ICD-10-CM | POA: Diagnosis present

## 2021-01-26 DIAGNOSIS — K766 Portal hypertension: Secondary | ICD-10-CM | POA: Diagnosis present

## 2021-01-26 DIAGNOSIS — K449 Diaphragmatic hernia without obstruction or gangrene: Secondary | ICD-10-CM | POA: Diagnosis present

## 2021-01-26 DIAGNOSIS — Z950 Presence of cardiac pacemaker: Secondary | ICD-10-CM | POA: Diagnosis present

## 2021-01-26 DIAGNOSIS — I428 Other cardiomyopathies: Secondary | ICD-10-CM | POA: Diagnosis present

## 2021-01-26 DIAGNOSIS — M1990 Unspecified osteoarthritis, unspecified site: Secondary | ICD-10-CM | POA: Diagnosis present

## 2021-01-26 DIAGNOSIS — E860 Dehydration: Secondary | ICD-10-CM | POA: Diagnosis present

## 2021-01-26 DIAGNOSIS — N1832 Chronic kidney disease, stage 3b: Secondary | ICD-10-CM | POA: Diagnosis present

## 2021-01-26 DIAGNOSIS — I10 Essential (primary) hypertension: Secondary | ICD-10-CM | POA: Diagnosis present

## 2021-01-26 DIAGNOSIS — Z952 Presence of prosthetic heart valve: Secondary | ICD-10-CM

## 2021-01-26 DIAGNOSIS — K922 Gastrointestinal hemorrhage, unspecified: Secondary | ICD-10-CM | POA: Diagnosis not present

## 2021-01-26 DIAGNOSIS — I442 Atrioventricular block, complete: Secondary | ICD-10-CM | POA: Diagnosis present

## 2021-01-26 DIAGNOSIS — M109 Gout, unspecified: Secondary | ICD-10-CM | POA: Diagnosis present

## 2021-01-26 DIAGNOSIS — I130 Hypertensive heart and chronic kidney disease with heart failure and stage 1 through stage 4 chronic kidney disease, or unspecified chronic kidney disease: Principal | ICD-10-CM | POA: Diagnosis present

## 2021-01-26 DIAGNOSIS — N183 Chronic kidney disease, stage 3 unspecified: Secondary | ICD-10-CM | POA: Diagnosis present

## 2021-01-26 DIAGNOSIS — Z7982 Long term (current) use of aspirin: Secondary | ICD-10-CM

## 2021-01-26 DIAGNOSIS — Z794 Long term (current) use of insulin: Secondary | ICD-10-CM

## 2021-01-26 DIAGNOSIS — K219 Gastro-esophageal reflux disease without esophagitis: Secondary | ICD-10-CM | POA: Diagnosis present

## 2021-01-26 DIAGNOSIS — R1084 Generalized abdominal pain: Secondary | ICD-10-CM | POA: Diagnosis present

## 2021-01-26 DIAGNOSIS — K7460 Unspecified cirrhosis of liver: Secondary | ICD-10-CM | POA: Diagnosis present

## 2021-01-26 DIAGNOSIS — K7689 Other specified diseases of liver: Secondary | ICD-10-CM | POA: Diagnosis not present

## 2021-01-26 DIAGNOSIS — K769 Liver disease, unspecified: Secondary | ICD-10-CM | POA: Diagnosis present

## 2021-01-26 DIAGNOSIS — K5904 Chronic idiopathic constipation: Secondary | ICD-10-CM

## 2021-01-26 DIAGNOSIS — I350 Nonrheumatic aortic (valve) stenosis: Secondary | ICD-10-CM | POA: Diagnosis not present

## 2021-01-26 DIAGNOSIS — I4821 Permanent atrial fibrillation: Secondary | ICD-10-CM | POA: Diagnosis present

## 2021-01-26 LAB — DIFFERENTIAL
Abs Immature Granulocytes: 0.07 10*3/uL (ref 0.00–0.07)
Basophils Absolute: 0.1 10*3/uL (ref 0.0–0.1)
Basophils Relative: 1 %
Eosinophils Absolute: 0.5 10*3/uL (ref 0.0–0.5)
Eosinophils Relative: 7 %
Immature Granulocytes: 1 %
Lymphocytes Relative: 11 %
Lymphs Abs: 0.8 10*3/uL (ref 0.7–4.0)
Monocytes Absolute: 0.9 10*3/uL (ref 0.1–1.0)
Monocytes Relative: 13 %
Neutro Abs: 4.8 10*3/uL (ref 1.7–7.7)
Neutrophils Relative %: 67 %

## 2021-01-26 LAB — RESP PANEL BY RT-PCR (FLU A&B, COVID) ARPGX2
Influenza A by PCR: NEGATIVE
Influenza B by PCR: NEGATIVE
SARS Coronavirus 2 by RT PCR: NEGATIVE

## 2021-01-26 LAB — COMPREHENSIVE METABOLIC PANEL
ALT: 22 U/L (ref 0–44)
AST: 35 U/L (ref 15–41)
Albumin: 2.8 g/dL — ABNORMAL LOW (ref 3.5–5.0)
Alkaline Phosphatase: 251 U/L — ABNORMAL HIGH (ref 38–126)
Anion gap: 9 (ref 5–15)
BUN: 111 mg/dL — ABNORMAL HIGH (ref 8–23)
CO2: 25 mmol/L (ref 22–32)
Calcium: 8.4 mg/dL — ABNORMAL LOW (ref 8.9–10.3)
Chloride: 102 mmol/L (ref 98–111)
Creatinine, Ser: 1.8 mg/dL — ABNORMAL HIGH (ref 0.61–1.24)
GFR, Estimated: 39 mL/min — ABNORMAL LOW (ref >60)
Glucose, Bld: 213 mg/dL — ABNORMAL HIGH (ref 70–99)
Potassium: 4.7 mmol/L (ref 3.5–5.1)
Sodium: 136 mmol/L (ref 135–145)
Total Bilirubin: 2 mg/dL — ABNORMAL HIGH (ref 0.3–1.2)
Total Protein: 6.7 g/dL (ref 6.5–8.1)

## 2021-01-26 LAB — CBC
HCT: 24.6 % — ABNORMAL LOW (ref 39.0–52.0)
Hemoglobin: 7.9 g/dL — ABNORMAL LOW (ref 13.0–17.0)
MCH: 26.4 pg (ref 26.0–34.0)
MCHC: 32.1 g/dL (ref 30.0–36.0)
MCV: 82.3 fL (ref 80.0–100.0)
Platelets: 129 10*3/uL — ABNORMAL LOW (ref 150–400)
RBC: 2.99 MIL/uL — ABNORMAL LOW (ref 4.22–5.81)
RDW: 23.3 % — ABNORMAL HIGH (ref 11.5–15.5)
WBC: 7.1 10*3/uL (ref 4.0–10.5)
nRBC: 0 % (ref 0.0–0.2)

## 2021-01-26 LAB — URINALYSIS, ROUTINE W REFLEX MICROSCOPIC
Bilirubin Urine: NEGATIVE
Glucose, UA: NEGATIVE mg/dL
Hgb urine dipstick: NEGATIVE
Ketones, ur: NEGATIVE mg/dL
Leukocytes,Ua: NEGATIVE
Nitrite: NEGATIVE
Protein, ur: NEGATIVE mg/dL
Specific Gravity, Urine: 1.008 (ref 1.005–1.030)
pH: 6 (ref 5.0–8.0)

## 2021-01-26 LAB — LIPASE, BLOOD: Lipase: 39 U/L (ref 11–51)

## 2021-01-26 LAB — GLUCOSE, CAPILLARY: Glucose-Capillary: 172 mg/dL — ABNORMAL HIGH (ref 70–99)

## 2021-01-26 MED ORDER — DIPHENHYDRAMINE HCL 50 MG/ML IJ SOLN
12.5000 mg | Freq: Once | INTRAMUSCULAR | Status: AC
  Administered 2021-01-26: 12.5 mg via INTRAVENOUS
  Filled 2021-01-26: qty 1

## 2021-01-26 MED ORDER — SODIUM CHLORIDE 0.9 % IV SOLN: INTRAVENOUS | Status: DC

## 2021-01-26 MED ORDER — METRONIDAZOLE 500 MG/100ML IV SOLN
500.0000 mg | Freq: Two times a day (BID) | INTRAVENOUS | Status: DC
  Administered 2021-01-26 – 2021-01-29 (×6): 500 mg via INTRAVENOUS
  Filled 2021-01-26 (×6): qty 100

## 2021-01-26 MED ORDER — SENNOSIDES-DOCUSATE SODIUM 8.6-50 MG PO TABS
1.0000 | ORAL_TABLET | Freq: Two times a day (BID) | ORAL | Status: AC
  Administered 2021-01-26: 1 via ORAL
  Filled 2021-01-26 (×2): qty 1

## 2021-01-26 MED ORDER — ONDANSETRON HCL 4 MG/2ML IJ SOLN
4.0000 mg | Freq: Once | INTRAMUSCULAR | Status: AC
  Administered 2021-01-26: 4 mg via INTRAVENOUS
  Filled 2021-01-26: qty 2

## 2021-01-26 MED ORDER — PROMETHAZINE HCL 12.5 MG PO TABS: 12.5000 mg | ORAL_TABLET | Freq: Three times a day (TID) | ORAL | Status: DC | PRN

## 2021-01-26 MED ORDER — ACETAMINOPHEN 650 MG RE SUPP: 650.0000 mg | Freq: Four times a day (QID) | RECTAL | Status: DC | PRN

## 2021-01-26 MED ORDER — INSULIN GLARGINE-YFGN 100 UNIT/ML ~~LOC~~ SOLN
10.0000 [IU] | Freq: Every day | SUBCUTANEOUS | Status: DC
  Administered 2021-01-26 – 2021-01-28 (×3): 10 [IU] via SUBCUTANEOUS
  Filled 2021-01-26 (×4): qty 0.1

## 2021-01-26 MED ORDER — LINACLOTIDE 145 MCG PO CAPS: 145.0000 ug | ORAL_CAPSULE | Freq: Every day | ORAL | 3 refills | Status: AC

## 2021-01-26 MED ORDER — ONDANSETRON HCL 4 MG PO TABS: 4.0000 mg | ORAL_TABLET | Freq: Four times a day (QID) | ORAL | Status: DC | PRN

## 2021-01-26 MED ORDER — POLYETHYLENE GLYCOL 3350 17 G PO PACK
17.0000 g | PACK | Freq: Two times a day (BID) | ORAL | Status: DC
  Administered 2021-01-26 – 2021-01-28 (×3): 17 g via ORAL
  Filled 2021-01-26 (×6): qty 1

## 2021-01-26 MED ORDER — ASPIRIN EC 81 MG PO TBEC
81.0000 mg | DELAYED_RELEASE_TABLET | Freq: Every day | ORAL | Status: DC
  Administered 2021-01-28 – 2021-01-29 (×2): 81 mg via ORAL
  Filled 2021-01-26 (×2): qty 1

## 2021-01-26 MED ORDER — ACETAMINOPHEN 325 MG PO TABS
650.0000 mg | ORAL_TABLET | Freq: Four times a day (QID) | ORAL | Status: DC | PRN
  Administered 2021-01-27: 650 mg via ORAL
  Filled 2021-01-26: qty 2

## 2021-01-26 MED ORDER — INSULIN ASPART 100 UNIT/ML IJ SOLN: 0.0000 [IU] | Freq: Every day | INTRAMUSCULAR | Status: DC

## 2021-01-26 MED ORDER — ENOXAPARIN SODIUM 40 MG/0.4ML IJ SOSY
40.0000 mg | PREFILLED_SYRINGE | INTRAMUSCULAR | Status: DC
  Administered 2021-01-26: 40 mg via SUBCUTANEOUS
  Filled 2021-01-26: qty 0.4

## 2021-01-26 MED ORDER — ONDANSETRON HCL 4 MG/2ML IJ SOLN: 4.0000 mg | Freq: Four times a day (QID) | INTRAMUSCULAR | Status: DC | PRN

## 2021-01-26 MED ORDER — CARVEDILOL 3.125 MG PO TABS
3.1250 mg | ORAL_TABLET | Freq: Two times a day (BID) | ORAL | Status: DC
  Administered 2021-01-27: 3.125 mg via ORAL
  Filled 2021-01-26 (×2): qty 1

## 2021-01-26 MED ORDER — LINACLOTIDE 145 MCG PO CAPS
145.0000 ug | ORAL_CAPSULE | Freq: Every day | ORAL | Status: DC
Start: 2021-01-27 — End: 2021-01-29
  Administered 2021-01-27 – 2021-01-29 (×3): 145 ug via ORAL
  Filled 2021-01-26 (×3): qty 1

## 2021-01-26 MED ORDER — SODIUM CHLORIDE 0.9 % IV SOLN
2.0000 g | INTRAVENOUS | Status: DC
  Administered 2021-01-26 – 2021-01-28 (×3): 2 g via INTRAVENOUS
  Filled 2021-01-26 (×3): qty 20

## 2021-01-26 MED ORDER — PANTOPRAZOLE SODIUM 40 MG PO TBEC
40.0000 mg | DELAYED_RELEASE_TABLET | Freq: Two times a day (BID) | ORAL | Status: DC
  Administered 2021-01-26: 40 mg via ORAL
  Filled 2021-01-26 (×2): qty 1

## 2021-01-26 MED ORDER — INSULIN ASPART 100 UNIT/ML IJ SOLN
0.0000 [IU] | Freq: Three times a day (TID) | INTRAMUSCULAR | Status: DC
  Administered 2021-01-27: 3 [IU] via SUBCUTANEOUS
  Administered 2021-01-27 – 2021-01-28 (×2): 1 [IU] via SUBCUTANEOUS
  Administered 2021-01-28 (×2): 2 [IU] via SUBCUTANEOUS
  Administered 2021-01-29: 1 [IU] via SUBCUTANEOUS

## 2021-01-26 MED ORDER — IOHEXOL 9 MG/ML PO SOLN
ORAL | Status: AC
  Administered 2021-01-26: 500 mL
  Filled 2021-01-26: qty 1000

## 2021-01-26 NOTE — ED Notes (Signed)
Patient transported to CT 

## 2021-01-26 NOTE — ED Triage Notes (Signed)
Abdominal pain onset last night

## 2021-01-26 NOTE — Telephone Encounter (Signed)
Patient with Problems: Duodenal ulcer status post GDA embolization NASH, ?early cirrhosis Congestive hepatopathy   Spoke with the patient wife whom states the patient was seen last week on 01/19/2021 for the above problems, now having.  He is nauseated,constipated,has lower abdominal pain. Tried having a bm this am it was hard and dark and produced very little stool in toilet. Has been using Miralax and drinking prune juice which is not of much help.   Please advise.

## 2021-01-26 NOTE — Telephone Encounter (Signed)
Patient wife aware of all, but now states with in the last hour patient patient has started vomiting. Per Dr. Jenetta Downer patient needs to go to Ed for evaluation. Patient wife states understanding.

## 2021-01-26 NOTE — ED Provider Notes (Signed)
[facility] EMERGENCY DEPARTMENT Provider Note   CSN: [id number] Arrival date & time: 01/26/21  1221     History Chief Complaint  Patient presents with   Abdominal Pain    John [name] is a 75 y.o. male.  The history is provided by the patient. No language interpreter was used.  Abdominal Pain Pain location:  Generalized Pain quality: aching   Pain radiates to:  Does not radiate Pain severity:  Moderate Onset quality:  Gradual Timing:  Constant Progression:  Worsening Chronicity:  New Context: not sick contacts   Relieved by:  Nothing Worsened by:  Nothing Ineffective treatments:  None tried Associated symptoms: nausea   Associated symptoms: no vomiting     Pt complains of lower abdominal pain.  Pt reports he has been constipated  Pt has chronic liver disease.    Past Medical History:  Diagnosis Date   Atrial flutter ([place]) 12/2010   Admitted with symptomatic bradycardia (HR 40s), atrial flutter with slow ventricular response 12/2010 + volume overload; AV nodal agents d/c'd and Pradaxa started; RFA in 01/2011   Chronic kidney disease, stage 3b (HCC)    Chronic systolic CHF (congestive heart failure) ([place])    Class 2 severe obesity due to excess calories with serious comorbidity and body mass index (BMI) of 37.0 to 37.9 in adult ([place]) 07/17/2017   Diabetes mellitus    non insulin dependant   Hematoma, chest wall 05/15/2020   Hyperlipidemia    Hypertension    Mitral regurgitation    NASH (nonalcoholic steatohepatitis)    NICM (nonischemic cardiomyopathy) (HCC)    Osteoarthritis    Polyarticular gout 05/15/2020   Presence of permanent cardiac pacemaker    PSVT (paroxysmal supraventricular tachycardia) (HCC)    Possibly atrial flutter   S/P TAVR (transcatheter aortic valve replacement) 05/03/2020   s/p TAVR with a 29 mm Edwards S3U via the subclavian approach with Dr. Angelena Form & Dr. Cyndia Bent    Severe aortic stenosis     Patient Active Problem List    Diagnosis Date Noted   Acute on chronic systolic (congestive) heart failure ([place]) 12/31/2020   SOB (shortness of breath) 12/31/2020   GERD (gastroesophageal reflux disease) 12/31/2020   History of anemia due to chronic kidney disease 12/31/2020   Duodenal ulcer 11/22/2020   Iron deficiency anemia due to chronic blood loss 11/22/2020   Itching 11/22/2020   Anemia    Hematoma, chest wall 05/15/2020   Polyarticular gout 05/15/2020   S/P TAVR (transcatheter aortic valve replacement) 05/03/2020   Severe aortic stenosis 04/25/2020   Chronic atrial fibrillation ([place]) 04/25/2020   Acute on chronic systolic CHF (congestive heart failure) ([place]) 04/25/2020   AKI (acute kidney injury) ([place]) 03/10/2020   Permanent atrial fibrillation ([place]) 03/10/2020   Calculus of gallbladder without cholecystitis without obstruction    Thrombocytopenia ([place]) 03/09/2020   Hypoalbuminemia 03/09/2020   NASH (nonalcoholic steatohepatitis) 03/03/2020   Elevated LFTs 03/03/2020   Constipation 03/03/2020   Lower leg edema 10/14/2019   Class 2 obesity    Hyponatremia 11/11/2018   CKD (chronic kidney disease) stage 3, GFR 30-59 ml/min (HCC)    Paroxysmal atrial fibrillation ([place])    Pacemaker 03/02/2018   Atrial pacemaker lead displacement 12/07/2015   NSVT (nonsustained ventricular tachycardia) 08/28/2015   Bradycardia 08/25/2015   Current use of long term anticoagulation 05/04/2011   Atrial flutter ([place]) 01/05/2011   DM type 2 causing vascular disease ([place]) 12/23/2009   Mixed hyperlipidemia 12/23/2009   Essential hypertension, benign 12/23/2009  Past Surgical History:  Procedure Laterality Date   ATRIAL FLUTTER ABLATION N/A 02/07/2011   Procedure: ATRIAL FLUTTER ABLATION;  Surgeon: Evans Lance, MD;  Location: [facility] CATH LAB;  Service: Cardiovascular;  Laterality: N/A;   BIV UPGRADE N/A 06/08/2020   Procedure: BIV PPM UPGRADE;  Surgeon: Evans Lance, MD;  Location: [place] CV LAB;  Service:  Cardiovascular;  Laterality: N/A;   CARDIAC ELECTROPHYSIOLOGY STUDY AND ABLATION  02/07/2011   CARDIOVERSION N/A 03/23/2016   Procedure: CARDIOVERSION;  Surgeon: Evans Lance, MD;  Location: [place];  Service: Cardiovascular;  Laterality: N/A;   COLONOSCOPY N/A 04/17/2017   Re[place]: 3 polyps in [facility], diverticulosis   EP IMPLANTABLE DEVICE N/A 08/29/2015   Procedure: Pacemaker Implant;  Surgeon: Evans Lance, MD;  Location: [place] CV LAB;  Service: Cardiovascular;  Laterality: N/A;   EP IMPLANTABLE DEVICE N/A 12/07/2015   Procedure: PPM Lead Revision/Repair;  Surgeon: Evans Lance, MD;  Location: [place] CV LAB;  Service: Cardiovascular;  Laterality: N/A;   ESOPHAGOGASTRODUODENOSCOPY (EGD) WITH PROPOFOL N/A 10/18/2020   Castaneda: normal esophagus, erythematous mucosa in gastric body, non bleeding duodenal ulcer with a non bleeding visible vessel (forrest Class IIa). Injected, clip placed, no specimens collected.   IR ANGIOGRAM SELECTIVE EACH ADDITIONAL VESSEL  10/19/2020   IR ANGIOGRAM SELECTIVE EACH ADDITIONAL VESSEL  10/19/2020   IR ANGIOGRAM VISCERAL SELECTIVE  10/19/2020   IR ANGIOGRAM VISCERAL SELECTIVE  10/19/2020   IR EMBO ART  VEN HEMORR LYMPH EXTRAV  INC GUIDE ROADMAPPING  10/19/2020   IR US GUIDE VASC ACCESS RIGHT  10/19/2020   KNEE ARTHROSCOPY  03/27/2003   left   LUMBAR SPINE SURGERY     "I've had 6 ORs 1972 thru 2004"   POLYPECTOMY  04/17/2017   Procedure: POLYPECTOMY;  Surgeon: Rogene Houston, MD;  [facility];  Service: Endoscopy;;  transverse colon x3;   RIGHT HEART CATH N/A 04/28/2020   Procedure: RIGHT HEART CATH;  Surgeon: Jolaine Artist, MD;  Location: [place] CV LAB;  Service: Cardiovascular;  Laterality: N/A;   RIGHT/LEFT HEART CATH AND CORONARY ANGIOGRAPHY N/A 12/21/2019   Procedure: RIGHT/LEFT HEART CATH AND CORONARY ANGIOGRAPHY;  Surgeon: Nelva Bush, MD;  Location: [place] CV LAB;  Service: Cardiovascular;   Laterality: N/A;   TEE WITHOUT CARDIOVERSION N/A 11/05/2019   Procedure: TRANSESOPHAGEAL ECHOCARDIOGRAM (TEE) WITH PROPOFOL;  Surgeon: Arnoldo Lenis, MD;  [facility];  Service: Endoscopy;  Laterality: N/A;   TEE WITHOUT CARDIOVERSION N/A 05/03/2020   Procedure: TRANSESOPHAGEAL ECHOCARDIOGRAM (TEE);  Surgeon: Burnell Blanks, MD;  Location: [place];  Service: Open Heart Surgery;  Laterality: N/A;       Family History  Problem Relation Age of Onset   Heart attack Mother    Hypertension Mother    Heart attack Father    Hypertension Father    Heart attack Brother    Colon cancer Neg Hx     Social History   Tobacco Use   Smoking status: Former    Packs/day: 1.00    Years: 10.00    Pack years: 10.00    Types: Cigarettes    Quit date: 03/26/1986    Years since quitting: 34.8   Smokeless tobacco: Never   Tobacco comments:    "stopped cigarette  smoking 1988"  Vaping Use   Vaping Use: Never used  Substance Use Topics   Alcohol use: Not Currently    Alcohol/week: 0.0 standard drinks  Comment: "quit alcohol ~ 2007"   Drug use: No    Home Medications Prior to Admission medications   Medication Sig Start Date End Date Taking? Authorizing Provider  ACCU-CHEK AVIVA PLUS test strip TEST BLOOD SUGAR TWICE DAILY BEFORE BREAKFAST AND AT BEDTIME 07/19/20   Cassandria Anger, MD  acetaminophen (TYLENOL) 325 MG tablet Take 2 tablets (650 mg total) by mouth every 4 (four) hours as needed for headache or mild pain. 05/15/20   Isaiah Serge, NP  allopurinol (ZYLOPRIM) 300 MG tablet Take 0.5 tablets (150 mg total) by mouth daily. Patient taking differently: Take 150 mg by mouth as needed. 07/20/20   Bensimhon, Shaune Pascal, MD  aspirin EC 81 MG tablet Take 1 tablet (81 mg total) by mouth daily with breakfast. 10/25/20 10/25/21  Roxan Hockey, MD  benzonatate (TESSALON) 100 MG capsule Take 1 capsule (100 mg total) by mouth 3 (three) times daily as needed for cough. 09/27/20    Evans Lance, MD  camphor-menthol (ANTI-ITCH) lotion Apply 1 application topically daily as needed for itching.    [provider]  carvedilol (COREG) 3.125 MG tablet TAKE 1 TABLET IN THE MORNING AND 2 TABLETS IN THE EVENING 07/20/20   Bensimhon, Shaune Pascal, MD  colchicine 0.6 MG tablet Take 1 tablet (0.6 mg total) by mouth daily as needed. 05/15/20   Isaiah Serge, NP  Dulaglutide (TRULICITY) 1.5 QQ/7.6PP SOPN INJECT 1.5MG (1 PEN) SUBCUTANEOUSLY EVERY WEEK Patient taking differently: Inject 1.5 mg into the skin once a week. Sunday 10/05/20   Cassandria Anger, MD  fexofenadine (ALLEGRA) 180 MG tablet fexofenadine 180 mg tablet  Take 1 tablet every day by oral route.    [provider]  hydrocortisone cream 1 % hydrocortisone 1 % topical cream  APPLY A THIN LAYER TO THE AFFECTED AREA(S) BY TOPICAL ROUTE 1 TIMES PER DAY    [provider]  insulin degludec (TRESIBA FLEXTOUCH) 100 UNIT/ML FlexTouch Pen Inject 40 Units into the skin at bedtime. 11/01/20   Cassandria Anger, MD  Insulin Pen Needle (BD PEN NEEDLE NANO U/F) 32G X 4 MM MISC 1 each by Does not apply route 4 (four) times daily. 12/23/18   Cassandria Anger, MD  linaclotide Rolan Lipa) 145 MCG CAPS capsule Take 1 capsule (145 mcg total) by mouth daily before breakfast. 01/26/21   Montez Morita, Quillian Quince, MD  loratadine (CLARITIN) 10 MG tablet TAKE 1 TABLET BY MOUTH EVERY DAY Patient taking differently: Take 10 mg by mouth daily. 10/03/20   Bensimhon, Shaune Pascal, MD  metolazone (ZAROXOLYN) 2.5 MG tablet Take 1 tablet (2.5 mg total) by mouth once a week. Every Monday 01/17/21   Rafael Bihari, FNP  Multiple Vitamins-Minerals (ONE-A-DAY MENS 50+) TABS Take 1 tablet by mouth daily with breakfast. Men    [provider]  pantoprazole (PROTONIX) 40 MG tablet pantoprazole 40 mg tablet,delayed release  TAKE 1 TABLET (40 MG TOTAL) BY MOUTH 2 (TWO) TIMES DAILY BEFORE A MEAL    [provider]   potassium chloride SA (KLOR-CON M20) 20 MEQ tablet Take 1.5 tablets (30 mEq total) by mouth 2 (two) times daily. Take 2 extra tabs on Mon with metolazone 12/12/20   Bensimhon, Shaune Pascal, MD  spironolactone (ALDACTONE) 25 MG tablet Take 0.5 tablets (12.5 mg total) by mouth daily. 07/20/20   Bensimhon, Shaune Pascal, MD  sucralfate (CARAFATE) 1 g tablet Take 1 tablet (1 g total) by mouth 4 (four) times daily. 10/22/20 10/22/21  Emokpae, Courage,  MD  torsemide (DEMADEX) 20 MG tablet TAKE 3 TABLETS (60 MG TOTAL) BY MOUTH 2 (TWO) TIMES DAILY. 11/01/20   Bensimhon, Shaune Pascal, MD    Allergies    Patient has no known allergies.  Review of Systems   Review of Systems  Gastrointestinal:  Positive for abdominal pain and nausea. Negative for vomiting.  All other systems reviewed and are negative.  Physical Exam Updated Vital Signs BP (!) 108/56   Pulse 84   Temp 97.6 F (36.4 C) (Oral)   Resp 20   Ht 6' (1.829 m)   Wt 97.5 kg   SpO2 99%   BMI 29.16 kg/m   Physical Exam Vitals and nursing note reviewed.  Constitutional:      Appearance: He is well-developed.  HENT:     Head: Normocephalic and atraumatic.  Eyes:     Conjunctiva/sclera: Conjunctivae normal.  Cardiovascular:     Rate and Rhythm: Normal rate and regular rhythm.     Heart sounds: No murmur heard. Pulmonary:     Effort: Pulmonary effort is normal. No respiratory distress.     Breath sounds: Normal breath sounds.  Abdominal:     General: Abdomen is flat. Bowel sounds are normal. There is distension and abdominal bruit.     Palpations: Abdomen is soft.     Tenderness: There is no abdominal tenderness.  Musculoskeletal:     Cervical back: Neck supple.  Skin:    General: Skin is warm and dry.  Neurological:     Mental Status: He is alert.    ED Results / Procedures / Treatments   Labs (all labs ordered are listed, but only abnormal results are displayed) Labs Reviewed  COMPREHENSIVE METABOLIC PANEL - Abnormal; Notable for the  following components:      Result Value   Glucose, Bld 213 (*)    BUN 111 (*)    Creatinine, Ser 1.80 (*)    Calcium 8.4 (*)    Albumin 2.8 (*)    Alkaline Phosphatase 251 (*)    Total Bilirubin 2.0 (*)    GFR, Estimated 39 (*)    All other components within normal limits  CBC - Abnormal; Notable for the following components:   RBC 2.99 (*)    Hemoglobin 7.9 (*)    HCT 24.6 (*)    RDW 23.3 (*)    Platelets 129 (*)    All other components within normal limits  URINALYSIS, ROUTINE W REFLEX MICROSCOPIC - Abnormal; Notable for the following components:   Color, Urine STRAW (*)    All other components within normal limits  LIPASE, BLOOD  DIFFERENTIAL    EKG None  Radiology CT ABDOMEN PELVIS WO CONTRAST  Result Date: 01/26/2021 CLINICAL DATA:  Acute abdominal pain in the LEFT lower quadrant with nausea and vomiting that began this morning in a 75 year old male EXAM: CT ABDOMEN AND PELVIS WITHOUT CONTRAST TECHNIQUE: Multidetector CT imaging of the abdomen and pelvis was performed following the standard protocol without IV contrast. COMPARISON:  April 30, 2020. FINDINGS: Lower chest: Signs of trans arterial aortic valve replacement seen on scout images. Pacer device in place, incompletely evaluated, lead in the RIGHT heart as before. Small RIGHT-sided pleural effusion is diminished from more remote imaging. Mild septal thickening the lung bases without dense consolidative process. Lung base assessment mildly limited due to respiratory motion. Hepatobiliary: Nodular hepatic contours compatible with cirrhotic morphologic changes. Cholelithiasis, contracted gallbladder about gallstones. No gross lesion on noncontrast imaging. Pancreas: No stranding about  the pancreas to the extent evaluated due to streak artifact from gastroduodenal artery coiling. Pancreatic atrophy as before. Spleen: Normal. Adrenals/Urinary Tract: Adrenal glands are normal. Smooth contour the bilateral kidneys. Chronic  perinephric stranding is unchanged. Venous collateral pathways about the RIGHT urinary bladder similarly unchanged. No substantial perivesical stranding, nephrolithiasis, ureteral calculi or hydronephrosis. Stomach/Bowel: Sigmoid diverticular disease with question of mild stranding near the descending/sigmoid junction. No abscess or free air. Normal appendix. Stomach is distended without signs of adjacent stranding. Small bowel is nondilated. Ovoid low-density structure adjacent to the third portion of the duodenum measures 4.4 x 3.2 cm previously 5.1 x 3.6 cm and December of 2021 and not substantially changed compared to imaging from February of 2022. Vascular/Lymphatic: Aortic atherosclerosis. No sign of aneurysm. Smooth contour of the IVC. There is no gastrohepatic or hepatoduodenal ligament lymphadenopathy. No retroperitoneal or mesenteric lymphadenopathy. No pelvic sidewall lymphadenopathy. Limited assessment of vascular structures without intravenous contrast on today's study. Reproductive: Unremarkable aside from adjacent collateral vessels near the urinary bladder and prostate gland, not well assessed given lack of contrast. Other: Moderate LEFT small RIGHT inguinal hernias containing fat. Musculoskeletal: No acute bone finding. No destructive bone process. Spinal degenerative changes. Degenerative changes are marked and worse at the L3-4 level. IMPRESSION: Suspect mild acute uncomplicated sigmoid diverticulitis at the descending/sigmoid junction. Pancolonic diverticulosis. Hepatic cirrhosis. Small RIGHT-sided pleural effusion is diminished from more remote imaging. 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 Cholelithiasis, contracted gallbladder about gallstones. Aortic Atherosclerosis (ICD10-I70.0). Electronically Signed   By: Zetta Bills M.D.   On: 01/26/2021 17:18     Procedures Procedures   Medications Ordered in ED Medications  iohexol (OMNIPAQUE) 9 MG/ML oral solution (has no administration in time range)  ondansetron (ZOFRAN) injection 4 mg (4 mg Intravenous Given 01/26/21 1445)    ED Course  I have reviewed the triage vital signs and the nursing notes.  Pertinent labs & imaging results that were available during my care of the patient were reviewed by me and considered in my medical decision making (see chart for details).    MDM Rules/Calculators/A&P                           MDM:  Pt has elevated bun and creatine,  Lft's elevated.  Ct abdomen shows probable diverticulitis.  I spoke with Hospitalist who will admit Final Clinical Impression(s) / ED Diagnoses Final diagnoses:  Diverticulitis  Dehydration  Acute renal failure, unspecified acute renal failure type ([place])  NASH (nonalcoholic steatohepatitis)  Nausea and vomiting, unspecified vomiting type    Rx / DC Orders ED Discharge Orders     None        Sidney Ace 01/26/21 1807    Noemi Chapel, MD 01/26/21 1816

## 2021-01-26 NOTE — ED Notes (Signed)
Admitting at bedside 

## 2021-01-26 NOTE — Telephone Encounter (Signed)
I'll send a prescription for Linzess 145 mcg qday. He can stop taking the Miralax and only take the Linzess.

## 2021-01-26 NOTE — Telephone Encounter (Signed)
Spouse called stated patient was seen in the office last week - states he is not feeling well - states his stomach is bloated and is constipated - says his stool is dark - please advise - ph# [phone]

## 2021-01-26 NOTE — H&P (Addendum)
History and Physical    John [name] GGE:[id number] DOB: January 23, 1946 DOA: 01/26/2021  PCP: [name], MD   Patient coming from: Home  I have personally briefly reviewed patient's old medical records in [place]  Chief Complaint: Abdominal pain  HPI: John [name] is a 75 y.o. male with medical history significant for fibrillation/flutter, diabetes mellitus, CKD 3, aortic stenosis status post TAVR, systolic CHF NASH. Patient presented to the ED with complaints of abdominal bloating, and constipation over the past few days.  Last bowel movement was 3 days ago, and was hard.  Today patient had 3 episodes of vomiting with abdominal pain.  Vomitus was without blood.  Abdominal pain is generalized.  Patient also reported that today his stool was dark.  Recent hospitalization 10/8 - 12/2020 with acute on chronic congestive heart failure, diuresed with IV Lasix and metolazone.  Diuresed about 5 L.  Also with AKI on CKD 3B, thought cardiorenal in etiology.  ED Course: Temperature 97.6.  Heart rate is 280s.  Respiratory rate 17-24.  Blood pressure 465-035 systolic. WBC 7.1.  Creatinine 1.8.  Abdominal CT showed just mild acute uncomplicated diverticulitis.  Hospitalist to admit.  Review of Systems: As per HPI all other systems reviewed and negative.  Past Medical History:  Diagnosis Date   Atrial flutter ([place]) 12/2010   Admitted with symptomatic bradycardia (HR 40s), atrial flutter with slow ventricular response 12/2010 + volume overload; AV nodal agents d/c'd and Pradaxa started; RFA in 01/2011   Chronic kidney disease, stage 3b (HCC)    Chronic systolic CHF (congestive heart failure) (HCC)    Class 2 severe obesity due to excess calories with serious comorbidity and body mass index (BMI) of 37.0 to 37.9 in adult ([place]) 07/17/2017   Diabetes mellitus    non insulin dependant   Hematoma, chest wall 05/15/2020   Hyperlipidemia    Hypertension    Mitral regurgitation    NASH  (nonalcoholic steatohepatitis)    NICM (nonischemic cardiomyopathy) (HCC)    Osteoarthritis    Polyarticular gout 05/15/2020   Presence of permanent cardiac pacemaker    PSVT (paroxysmal supraventricular tachycardia) (HCC)    Possibly atrial flutter   S/P TAVR (transcatheter aortic valve replacement) 05/03/2020   s/p TAVR with a 29 mm Edwards S3U via the subclavian approach with Dr. Angelena Form & Dr. Cyndia Bent    Severe aortic stenosis     Past Surgical History:  Procedure Laterality Date   ATRIAL FLUTTER ABLATION N/A 02/07/2011   Procedure: ATRIAL FLUTTER ABLATION;  Surgeon: Evans Lance, MD;  Location: [facility] CATH LAB;  Service: Cardiovascular;  Laterality: N/A;   BIV UPGRADE N/A 06/08/2020   Procedure: BIV PPM UPGRADE;  Surgeon: Evans Lance, MD;  Location: [place] CV LAB;  Service: Cardiovascular;  Laterality: N/A;   CARDIAC ELECTROPHYSIOLOGY STUDY AND ABLATION  02/07/2011   CARDIOVERSION N/A 03/23/2016   Procedure: CARDIOVERSION;  Surgeon: Evans Lance, MD;  Location: [facility];  Service: Cardiovascular;  Laterality: N/A;   COLONOSCOPY N/A 04/17/2017   Rehman: 3 polyps in [facility], diverticulosis   EP IMPLANTABLE DEVICE N/A 08/29/2015   Procedure: Pacemaker Implant;  Surgeon: Evans Lance, MD;  Location: [place] CV LAB;  Service: Cardiovascular;  Laterality: N/A;   EP IMPLANTABLE DEVICE N/A 12/07/2015   Procedure: PPM Lead Revision/Repair;  Surgeon: Evans Lance, MD;  Location: [place] CV LAB;  Service: Cardiovascular;  Laterality: N/A;   ESOPHAGOGASTRODUODENOSCOPY (EGD) WITH PROPOFOL N/A 10/18/2020  Castaneda: normal esophagus, erythematous mucosa in gastric body, non bleeding duodenal ulcer with a non bleeding visible vessel (forrest Class IIa). Injected, clip placed, no specimens collected.   IR ANGIOGRAM SELECTIVE EACH ADDITIONAL VESSEL  10/19/2020   IR ANGIOGRAM SELECTIVE EACH ADDITIONAL VESSEL  10/19/2020   IR ANGIOGRAM VISCERAL SELECTIVE  10/19/2020   IR  ANGIOGRAM VISCERAL SELECTIVE  10/19/2020   IR EMBO ART  VEN HEMORR LYMPH EXTRAV  INC GUIDE ROADMAPPING  10/19/2020   IR US GUIDE VASC ACCESS RIGHT  10/19/2020   KNEE ARTHROSCOPY  03/27/2003   left   LUMBAR SPINE SURGERY     "I've had 6 ORs 1972 thru 2004"   POLYPECTOMY  04/17/2017   Procedure: POLYPECTOMY;  Surgeon: Rogene Houston, MD;  [facility];  Service: Endoscopy;;  transverse colon x3;   RIGHT HEART CATH N/A 04/28/2020   Procedure: RIGHT HEART CATH;  Surgeon: Jolaine Artist, MD;  Location: [place] CV LAB;  Service: Cardiovascular;  Laterality: N/A;   RIGHT/LEFT HEART CATH AND CORONARY ANGIOGRAPHY N/A 12/21/2019   Procedure: RIGHT/LEFT HEART CATH AND CORONARY ANGIOGRAPHY;  Surgeon: Nelva Bush, MD;  Location: [place] CV LAB;  Service: Cardiovascular;  Laterality: N/A;   TEE WITHOUT CARDIOVERSION N/A 11/05/2019   Procedure: TRANSESOPHAGEAL ECHOCARDIOGRAM (TEE) WITH PROPOFOL;  Surgeon: Arnoldo Lenis, MD;  [facility];  Service: Endoscopy;  Laterality: N/A;   TEE WITHOUT CARDIOVERSION N/A 05/03/2020   Procedure: TRANSESOPHAGEAL ECHOCARDIOGRAM (TEE);  Surgeon: Burnell Blanks, MD;  Location: [place];  Service: Open Heart Surgery;  Laterality: N/A;     reports that he quit smoking about 34 years ago. His smoking use included cigarettes. He has a 10.00 pack-year smoking history. He has never used smokeless tobacco. He reports that he does not currently use alcohol. He reports that he does not use drugs.  No Known Allergies  Family History  Problem Relation Age of Onset   Heart attack Mother    Hypertension Mother    Heart attack Father    Hypertension Father    Heart attack Brother    Colon cancer Neg Hx     Prior to Admission medications   Medication Sig Start Date End Date Taking? Authorizing Provider  acetaminophen (TYLENOL) 325 MG tablet Take 2 tablets (650 mg total) by mouth every 4 (four) hours as needed for headache or  mild pain. 05/15/20  Yes Isaiah Serge, NP  allopurinol (ZYLOPRIM) 300 MG tablet Take 0.5 tablets (150 mg total) by mouth daily. Patient taking differently: Take 150 mg by mouth as needed. 07/20/20  Yes Bensimhon, Shaune Pascal, MD  aspirin EC 81 MG tablet Take 1 tablet (81 mg total) by mouth daily with breakfast. 10/25/20 10/25/21 Yes Dedria Endres, Courage, MD  benzonatate (TESSALON) 100 MG capsule Take 1 capsule (100 mg total) by mouth 3 (three) times daily as needed for cough. 09/27/20  Yes Evans Lance, MD  camphor-menthol (ANTI-ITCH) lotion Apply 1 application topically daily as needed for itching.   Yes [provider]  carvedilol (COREG) 3.125 MG tablet TAKE 1 TABLET IN THE MORNING AND 2 TABLETS IN THE EVENING 07/20/20  Yes Bensimhon, Shaune Pascal, MD  colchicine 0.6 MG tablet Take 1 tablet (0.6 mg total) by mouth daily as needed. 05/15/20  Yes Isaiah Serge, NP  Dulaglutide (TRULICITY) 1.5 NG/2.9BM SOPN INJECT 1.5MG (1 PEN) SUBCUTANEOUSLY EVERY WEEK Patient taking differently: Inject 1.5 mg into the skin once a week. Sunday 10/05/20  Yes Nida,  Marella Chimes, MD  hydrocortisone cream 1 % Apply 1 application topically daily.   Yes [provider]  insulin degludec (TRESIBA FLEXTOUCH) 100 UNIT/ML FlexTouch Pen Inject 40 Units into the skin at bedtime. 11/01/20  Yes Cassandria Anger, MD  linaclotide Rolan Lipa) 145 MCG CAPS capsule Take 1 capsule (145 mcg total) by mouth daily before breakfast. 01/26/21  Yes Montez Morita, Quillian Quince, MD  metolazone (ZAROXOLYN) 2.5 MG tablet Take 1 tablet (2.5 mg total) by mouth once a week. Every Monday 01/17/21  Yes Milford, Maricela Bo, FNP  Multiple Vitamins-Minerals (ONE-A-DAY MENS 50+) TABS Take 1 tablet by mouth daily with breakfast. Men   Yes [provider]  pantoprazole (PROTONIX) 40 MG tablet Take 40 mg by mouth 2 (two) times daily.   Yes [provider]  potassium chloride SA (KLOR-CON M20) 20 MEQ tablet Take 1.5 tablets (30 mEq  total) by mouth 2 (two) times daily. Take 2 extra tabs on Mon with metolazone 12/12/20  Yes Bensimhon, Shaune Pascal, MD  spironolactone (ALDACTONE) 25 MG tablet Take 0.5 tablets (12.5 mg total) by mouth daily. 07/20/20  Yes Bensimhon, Shaune Pascal, MD  sucralfate (CARAFATE) 1 g tablet Take 1 tablet (1 g total) by mouth 4 (four) times daily. 10/22/20 10/22/21 Yes Thos Matsumoto, Courage, MD  torsemide (DEMADEX) 20 MG tablet TAKE 3 TABLETS (60 MG TOTAL) BY MOUTH 2 (TWO) TIMES DAILY. 11/01/20  Yes Bensimhon, Shaune Pascal, MD  ACCU-CHEK AVIVA PLUS test strip TEST BLOOD SUGAR TWICE DAILY BEFORE BREAKFAST AND AT BEDTIME 07/19/20   Cassandria Anger, MD  fexofenadine (ALLEGRA) 180 MG tablet fexofenadine 180 mg tablet  Take 1 tablet every day by oral route. Patient not taking: No sig reported    [provider]  Insulin Pen Needle (BD PEN NEEDLE NANO U/F) 32G X 4 MM MISC 1 each by Does not apply route 4 (four) times daily. 12/23/18   Cassandria Anger, MD  loratadine (CLARITIN) 10 MG tablet TAKE 1 TABLET BY MOUTH EVERY DAY Patient not taking: No sig reported 10/03/20   Jolaine Artist, MD    Physical Exam: Vitals:   01/26/21 1730 01/26/21 1830 01/26/21 1900 01/26/21 2000  BP: (!) 108/51 (!) 123/58 108/60 (!) 109/52  Pulse: (!) 52 71 61 63  Resp: 20 20 (!) 22 19  Temp:    98.2 F (36.8 C)  TempSrc:    Oral  SpO2: 95% 98% 100% 98%  Weight:      Height:        Constitutional: NAD, calm, comfortable Vitals:   01/26/21 1730 01/26/21 1830 01/26/21 1900 01/26/21 2000  BP: (!) 108/51 (!) 123/58 108/60 (!) 109/52  Pulse: (!) 52 71 61 63  Resp: 20 20 (!) 22 19  Temp:    98.2 F (36.8 C)  TempSrc:    Oral  SpO2: 95% 98% 100% 98%  Weight:      Height:       Eyes: PERRL, lids and conjunctivae normal ENMT: Mucous membranes are moist  Neck: normal, supple, no masses, no thyromegaly Respiratory: clear to auscultation bilaterally, no wheezing, no crackles. Normal respiratory effort. No accessory muscle  use.  Cardiovascular: Regular rate and rhythm, no murmurs / rubs / gallops. No extremity edema. 2+ pedal pulses.   Abdomen: no tenderness, no masses palpated. No hepatosplenomegaly. Bowel sounds positive.  Musculoskeletal: no clubbing / cyanosis. No joint deformity upper and lower extremities. Good ROM, no contractures. Normal muscle tone.  Skin: no rashes, lesions, ulcers. No induration  Neurologic: No apparent cranial nerve abnormality, moving extremities spontaneously Psychiatric: Normal judgment and insight. Alert and oriented x 3. Normal mood.   Labs on Admission: I have personally reviewed following labs and imaging studies  CBC: Recent Labs  Lab 01/26/21 1305  WBC 7.1  NEUTROABS 4.8  HGB 7.9*  HCT 24.6*  MCV 82.3  PLT 409*   Basic Metabolic Panel: Recent Labs  Lab 01/26/21 1305  NA 136  K 4.7  CL 102  CO2 25  GLUCOSE 213*  BUN 111*  CREATININE 1.80*  CALCIUM 8.4*   GFR: Estimated Creatinine Clearance: 42.9 mL/min (A) (by C-G formula based on SCr of 1.8 mg/dL (H)). Liver Function Tests: Recent Labs  Lab 01/26/21 1305  AST 35  ALT 22  ALKPHOS 251*  BILITOT 2.0*  PROT 6.7  ALBUMIN 2.8*   Recent Labs  Lab 01/26/21 1305  LIPASE 39   Urine analysis:    Component Value Date/Time   COLORURINE STRAW (A) 01/26/2021 1328   APPEARANCEUR CLEAR 01/26/2021 1328   LABSPEC 1.008 01/26/2021 1328   PHURINE 6.0 01/26/2021 1328   GLUCOSEU NEGATIVE 01/26/2021 1328   [place] 01/26/2021 1328   [place] 01/26/2021 1328   [place] 01/26/2021 1328   PROTEINUR NEGATIVE 01/26/2021 1328   UROBILINOGEN 0.2 01/05/2014 0744   NITRITE NEGATIVE 01/26/2021 1328   LEUKOCYTESUR NEGATIVE 01/26/2021 1328    Radiological Exams on Admission: CT ABDOMEN PELVIS WO CONTRAST  Result Date: 01/26/2021 CLINICAL DATA:  Acute abdominal pain in the LEFT lower quadrant with nausea and vomiting that began this morning in a 75 year old male EXAM: CT ABDOMEN AND  PELVIS WITHOUT CONTRAST TECHNIQUE: Multidetector CT imaging of the abdomen and pelvis was performed following the standard protocol without IV contrast. COMPARISON:  April 30, 2020. FINDINGS: Lower chest: Signs of trans arterial aortic valve replacement seen on scout images. Pacer device in place, incompletely evaluated, lead in the RIGHT heart as before. Small RIGHT-sided pleural effusion is diminished from more remote imaging. Mild septal thickening the lung bases without dense consolidative process. Lung base assessment mildly limited due to respiratory motion. Hepatobiliary: Nodular hepatic contours compatible with cirrhotic morphologic changes. Cholelithiasis, contracted gallbladder about gallstones. No gross lesion on noncontrast imaging. Pancreas: No stranding about the pancreas to the extent evaluated due to streak artifact from gastroduodenal artery coiling. Pancreatic atrophy as before. Spleen: Normal. Adrenals/Urinary Tract: Adrenal glands are normal. Smooth contour the bilateral kidneys. Chronic perinephric stranding is unchanged. Venous collateral pathways about the RIGHT urinary bladder similarly unchanged. No substantial perivesical stranding, nephrolithiasis, ureteral calculi or hydronephrosis. Stomach/Bowel: Sigmoid diverticular disease with question of mild stranding near the descending/sigmoid junction. No abscess or free air. Normal appendix. Stomach is distended without signs of adjacent stranding. Small bowel is nondilated. Ovoid low-density structure adjacent to the third portion of the duodenum measures 4.4 x 3.2 cm previously 5.1 x 3.6 cm and December of 2021 and not substantially changed compared to imaging from February of 2022. Vascular/Lymphatic: Aortic atherosclerosis. No sign of aneurysm. Smooth contour of the IVC. There is no gastrohepatic or hepatoduodenal ligament lymphadenopathy. No retroperitoneal or mesenteric lymphadenopathy. No pelvic sidewall lymphadenopathy. Limited  assessment of vascular structures without intravenous contrast on today's study. Reproductive: Unremarkable aside from adjacent collateral vessels near the urinary bladder and prostate gland, not well assessed given lack of contrast. Other: Moderate LEFT small RIGHT inguinal hernias containing fat. Musculoskeletal: No acute bone finding. No destructive bone process. Spinal degenerative changes. Degenerative changes are marked and worse at the  L3-4 level. IMPRESSION: Suspect mild acute uncomplicated sigmoid diverticulitis at the descending/sigmoid junction. Pancolonic diverticulosis. Hepatic cirrhosis. Small RIGHT-sided pleural effusion is diminished from more remote imaging. 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 Cholelithiasis, contracted gallbladder about gallstones. Aortic Atherosclerosis (ICD10-I70.0). Electronically Signed   By: Zetta Bills M.D.   On: 01/26/2021 17:18    EKG: pending.  EKG shows paced rhythm. No change from prior.  Assessment/Plan Principal Problem:   Acute diverticulitis Active Problems:   Essential hypertension, benign   Atrial flutter (HCC)   CKD (chronic kidney disease) stage 3, GFR 30-59 ml/min (HCC)   Chronic atrial fibrillation (HCC)   S/P TAVR (transcatheter aortic valve replacement)   Acute diverticulitis-abdominal bloating, with pain.  Benign abdominal exam.  WBC 7.1.  Rules out for sepsis.  CT abdomen and pelvis suggest mild acute uncomplicated sigmoid diverticulitis at the descending/sigmoid junction.  Reports 1 episode of  dark stool, otherwise constipated. -IV Ceftriaxone and metronidazole -Gentle hydration with N/s 50cc/hr x 15hrs -As needed Zofran -Resume home Protonix 40 twice daily -Bowel rest with clear liquid diet  CKD 3B -creatinine 1.8, improved compared to patient's baseline of 1.8-2.2. -Gentle hydration -Hold diuretics,  spironolactone at this time  Systolic congestive heart failure-appears stable and compensated at this time.  Last echo recent, 12/2020 EF of 30 to 35% with global hypokinesis.  Follows with Dr. Haroldine Laws. - with limited oral intake, hold torsemide 60 mg twice daily for now, and as needed metolazone and spironolactone  Chronic atrial fibrillation/flutter, pacemaker status -EKG showing paced rhythm, not on anticoagulation due to GI bleed. -Resume carvedilol -Resume aspirin  Aortic stenosis status post TAVR  Uncontrolled diabetes mellitus-random glucose 213.  A1c 9.2. - SSI- S -Resume Tresiba at reduced dose 10 units nightly (home dose 40 units daily)   DVT prophylaxis: Lovenox Code Status: Full code Family Communication: Spouse at bedside Disposition Plan:  ~ 2 days Consults called: None Admission status: Obs, Med surg I certify that at the point of admission it is my clinical judgment that the patient will require inpatient hospital care spanning beyond 2 midnights from the point of admission due to high intensity of service, high risk for further deterioration and high frequency of surveillance required. The following factors support the patient status of inpatient:    Bethena Roys MD [facility]  01/26/2021, 9:26 PM

## 2021-01-26 NOTE — ED Notes (Signed)
Patient states he woke up with left lower abdominal pain radiating into center with nausea. Denies any diarrhea or urinary symptoms.

## 2021-01-27 ENCOUNTER — Encounter (HOSPITAL_COMMUNITY): Payer: Self-pay | Admitting: Internal Medicine

## 2021-01-27 ENCOUNTER — Observation Stay (HOSPITAL_COMMUNITY): Payer: Medicare HMO | Admitting: Certified Registered Nurse Anesthetist

## 2021-01-27 ENCOUNTER — Encounter (HOSPITAL_COMMUNITY)
Admission: EM | Disposition: A | Discharge status: Home / Self Care | Payer: Self-pay | Source: Home / Self Care | Attending: Internal Medicine

## 2021-01-27 DIAGNOSIS — I480 Paroxysmal atrial fibrillation: Secondary | ICD-10-CM | POA: Diagnosis present

## 2021-01-27 DIAGNOSIS — I5043 Acute on chronic combined systolic (congestive) and diastolic (congestive) heart failure: Secondary | ICD-10-CM | POA: Diagnosis not present

## 2021-01-27 DIAGNOSIS — I350 Nonrheumatic aortic (valve) stenosis: Secondary | ICD-10-CM | POA: Diagnosis not present

## 2021-01-27 DIAGNOSIS — K449 Diaphragmatic hernia without obstruction or gangrene: Secondary | ICD-10-CM | POA: Diagnosis present

## 2021-01-27 DIAGNOSIS — K922 Gastrointestinal hemorrhage, unspecified: Secondary | ICD-10-CM | POA: Diagnosis not present

## 2021-01-27 DIAGNOSIS — I130 Hypertensive heart and chronic kidney disease with heart failure and stage 1 through stage 4 chronic kidney disease, or unspecified chronic kidney disease: Secondary | ICD-10-CM | POA: Diagnosis present

## 2021-01-27 DIAGNOSIS — E1165 Type 2 diabetes mellitus with hyperglycemia: Secondary | ICD-10-CM | POA: Diagnosis present

## 2021-01-27 DIAGNOSIS — Z8249 Family history of ischemic heart disease and other diseases of the circulatory system: Secondary | ICD-10-CM | POA: Diagnosis not present

## 2021-01-27 DIAGNOSIS — I442 Atrioventricular block, complete: Secondary | ICD-10-CM | POA: Diagnosis not present

## 2021-01-27 DIAGNOSIS — K766 Portal hypertension: Secondary | ICD-10-CM

## 2021-01-27 DIAGNOSIS — I4819 Other persistent atrial fibrillation: Secondary | ICD-10-CM | POA: Diagnosis not present

## 2021-01-27 DIAGNOSIS — I428 Other cardiomyopathies: Secondary | ICD-10-CM | POA: Diagnosis present

## 2021-01-27 DIAGNOSIS — N179 Acute kidney failure, unspecified: Secondary | ICD-10-CM | POA: Diagnosis not present

## 2021-01-27 DIAGNOSIS — K5900 Constipation, unspecified: Secondary | ICD-10-CM | POA: Diagnosis present

## 2021-01-27 DIAGNOSIS — N1832 Chronic kidney disease, stage 3b: Secondary | ICD-10-CM | POA: Diagnosis present

## 2021-01-27 DIAGNOSIS — E1122 Type 2 diabetes mellitus with diabetic chronic kidney disease: Secondary | ICD-10-CM | POA: Diagnosis present

## 2021-01-27 DIAGNOSIS — E860 Dehydration: Secondary | ICD-10-CM | POA: Diagnosis present

## 2021-01-27 DIAGNOSIS — I4892 Unspecified atrial flutter: Secondary | ICD-10-CM | POA: Diagnosis present

## 2021-01-27 DIAGNOSIS — K7581 Nonalcoholic steatohepatitis (NASH): Secondary | ICD-10-CM | POA: Diagnosis not present

## 2021-01-27 DIAGNOSIS — Z952 Presence of prosthetic heart valve: Secondary | ICD-10-CM | POA: Diagnosis not present

## 2021-01-27 DIAGNOSIS — K3189 Other diseases of stomach and duodenum: Secondary | ICD-10-CM | POA: Diagnosis present

## 2021-01-27 DIAGNOSIS — Z66 Do not resuscitate: Secondary | ICD-10-CM | POA: Diagnosis not present

## 2021-01-27 DIAGNOSIS — D62 Acute posthemorrhagic anemia: Secondary | ICD-10-CM | POA: Diagnosis not present

## 2021-01-27 DIAGNOSIS — Z20822 Contact with and (suspected) exposure to covid-19: Secondary | ICD-10-CM | POA: Diagnosis present

## 2021-01-27 DIAGNOSIS — R1084 Generalized abdominal pain: Secondary | ICD-10-CM | POA: Diagnosis present

## 2021-01-27 DIAGNOSIS — K222 Esophageal obstruction: Secondary | ICD-10-CM | POA: Diagnosis not present

## 2021-01-27 DIAGNOSIS — K7460 Unspecified cirrhosis of liver: Secondary | ICD-10-CM | POA: Diagnosis present

## 2021-01-27 DIAGNOSIS — K5792 Diverticulitis of intestine, part unspecified, without perforation or abscess without bleeding: Secondary | ICD-10-CM | POA: Diagnosis not present

## 2021-01-27 DIAGNOSIS — K5732 Diverticulitis of large intestine without perforation or abscess without bleeding: Secondary | ICD-10-CM | POA: Diagnosis present

## 2021-01-27 DIAGNOSIS — I482 Chronic atrial fibrillation, unspecified: Secondary | ICD-10-CM | POA: Diagnosis not present

## 2021-01-27 HISTORY — PX: ESOPHAGOGASTRODUODENOSCOPY (EGD) WITH PROPOFOL: SHX5813

## 2021-01-27 LAB — GLUCOSE, CAPILLARY
Glucose-Capillary: 146 mg/dL — ABNORMAL HIGH (ref 70–99)
Glucose-Capillary: 189 mg/dL — ABNORMAL HIGH (ref 70–99)
Glucose-Capillary: 176 mg/dL — ABNORMAL HIGH (ref 70–99)
Glucose-Capillary: 217 mg/dL — ABNORMAL HIGH (ref 70–99)
Glucose-Capillary: 196 mg/dL — ABNORMAL HIGH (ref 70–99)

## 2021-01-27 LAB — CBC
HCT: 20.5 % — ABNORMAL LOW (ref 39.0–52.0)
Hemoglobin: 6.6 g/dL — CL (ref 13.0–17.0)
MCH: 26.6 pg (ref 26.0–34.0)
MCHC: 32.2 g/dL (ref 30.0–36.0)
MCV: 82.7 fL (ref 80.0–100.0)
Platelets: 133 10*3/uL — ABNORMAL LOW (ref 150–400)
RBC: 2.48 MIL/uL — ABNORMAL LOW (ref 4.22–5.81)
RDW: 23.6 % — ABNORMAL HIGH (ref 11.5–15.5)
WBC: 8.8 10*3/uL (ref 4.0–10.5)
nRBC: 0 % (ref 0.0–0.2)

## 2021-01-27 LAB — MAGNESIUM: Magnesium: 2.5 mg/dL — ABNORMAL HIGH (ref 1.7–2.4)

## 2021-01-27 LAB — PROTIME-INR
INR: 1.5 — ABNORMAL HIGH (ref 0.8–1.2)
Prothrombin Time: 17.6 seconds — ABNORMAL HIGH (ref 11.4–15.2)

## 2021-01-27 LAB — HEMOGLOBIN AND HEMATOCRIT, BLOOD
HCT: 20 % — ABNORMAL LOW (ref 39.0–52.0)
Hemoglobin: 6.5 g/dL — CL (ref 13.0–17.0)

## 2021-01-27 LAB — BASIC METABOLIC PANEL
Anion gap: 9 (ref 5–15)
BUN: 123 mg/dL — ABNORMAL HIGH (ref 8–23)
CO2: 24 mmol/L (ref 22–32)
Calcium: 8.2 mg/dL — ABNORMAL LOW (ref 8.9–10.3)
Chloride: 102 mmol/L (ref 98–111)
Creatinine, Ser: 1.88 mg/dL — ABNORMAL HIGH (ref 0.61–1.24)
GFR, Estimated: 37 mL/min — ABNORMAL LOW (ref >60)
Glucose, Bld: 161 mg/dL — ABNORMAL HIGH (ref 70–99)
Potassium: 4.4 mmol/L (ref 3.5–5.1)
Sodium: 135 mmol/L (ref 135–145)

## 2021-01-27 LAB — PREPARE RBC (CROSSMATCH)

## 2021-01-27 SURGERY — ESOPHAGOGASTRODUODENOSCOPY (EGD) WITH PROPOFOL
Anesthesia: General

## 2021-01-27 MED ORDER — PANTOPRAZOLE SODIUM 40 MG IV SOLR
40.0000 mg | Freq: Two times a day (BID) | INTRAVENOUS | Status: DC
  Administered 2021-01-27 – 2021-01-28 (×4): 40 mg via INTRAVENOUS
  Filled 2021-01-27 (×5): qty 40

## 2021-01-27 MED ORDER — ETOMIDATE 2 MG/ML IV SOLN
INTRAVENOUS | Status: DC | PRN
  Administered 2021-01-27: 8 mg via INTRAVENOUS

## 2021-01-27 MED ORDER — FUROSEMIDE 10 MG/ML IJ SOLN
60.0000 mg | Freq: Two times a day (BID) | INTRAMUSCULAR | Status: DC
  Administered 2021-01-27 (×2): 60 mg via INTRAVENOUS
  Filled 2021-01-27 (×3): qty 6

## 2021-01-27 MED ORDER — SODIUM CHLORIDE 0.9 % IV SOLN: INTRAVENOUS | Status: DC

## 2021-01-27 MED ORDER — PROPOFOL 10 MG/ML IV BOLUS
INTRAVENOUS | Status: DC | PRN
  Administered 2021-01-27: 50 mg via INTRAVENOUS

## 2021-01-27 MED ORDER — CYCLOBENZAPRINE HCL 10 MG PO TABS
5.0000 mg | ORAL_TABLET | Freq: Three times a day (TID) | ORAL | Status: DC | PRN
  Administered 2021-01-27: 5 mg via ORAL
  Filled 2021-01-27: qty 1

## 2021-01-27 MED ORDER — LIDOCAINE HCL (CARDIAC) PF 100 MG/5ML IV SOSY
PREFILLED_SYRINGE | INTRAVENOUS | Status: DC | PRN
  Administered 2021-01-27: 50 mg via INTRAVENOUS

## 2021-01-27 MED ORDER — SODIUM CHLORIDE 0.9% IV SOLUTION: Freq: Once | INTRAVENOUS | Status: AC

## 2021-01-27 MED ORDER — LACTATED RINGERS IV SOLN: INTRAVENOUS | Status: DC

## 2021-01-27 NOTE — Transfer of Care (Signed)
Immediate Anesthesia Transfer of Care Note  Patient: John [name]  Procedure(s) Performed: ESOPHAGOGASTRODUODENOSCOPY (EGD) WITH PROPOFOL  Patient Location: PACU  Anesthesia Type:General  Level of Consciousness: drowsy  Airway & Oxygen Therapy: Patient Spontanous Breathing and Patient connected to nasal cannula oxygen  Post-op Assessment: Report given to RN and Post -op Vital signs reviewed and stable  Post vital signs: Reviewed and stable  Last Vitals:  Vitals Value Taken Time  BP 89/71   Temp    Pulse 63 01/27/21 1150  Resp    SpO2 100 % 01/27/21 1150  Vitals shown include unvalidated device data.  Last Pain:  Vitals:   01/27/21 1137  TempSrc:   PainSc: 0-No pain         Complications: No notable events documented.

## 2021-01-27 NOTE — Anesthesia Preprocedure Evaluation (Signed)
Anesthesia Evaluation  Patient identified by MRN, date of birth, ID band Patient awake    Reviewed: Allergy & Precautions, H&P , NPO status , Patient's Chart, lab work & pertinent test results, reviewed documented beta blocker date and time   Airway Mallampati: II  TM Distance: >3 FB Neck ROM: full    Dental no notable dental hx.    Pulmonary neg pulmonary ROS, former smoker,    Pulmonary exam normal breath sounds clear to auscultation       Cardiovascular Exercise Tolerance: Good hypertension, + pacemaker  Rhythm:irregular Rate:Bradycardia     Neuro/Psych negative neurological ROS  negative psych ROS   GI/Hepatic PUD, GERD  Medicated,(+) Hepatitis -, Autoimmune  Endo/Other  negative endocrine ROSdiabetes  Renal/GU CRFRenal disease  negative genitourinary   Musculoskeletal   Abdominal   Peds  Hematology  (+) Blood dyscrasia, anemia ,   Anesthesia Other Findings   Reproductive/Obstetrics negative OB ROS                             Anesthesia Physical Anesthesia Plan  ASA: 4 and emergent  Anesthesia Plan: General   Post-op Pain Management:    Induction:   PONV Risk Score and Plan: Propofol infusion  Airway Management Planned:   Additional Equipment:   Intra-op Plan:   Post-operative Plan:   Informed Consent: I have reviewed the patients History and Physical, chart, labs and discussed the procedure including the risks, benefits and alternatives for the proposed anesthesia with the patient or authorized representative who has indicated his/her understanding and acceptance.     Dental Advisory Given  Plan Discussed with: CRNA  Anesthesia Plan Comments:         Anesthesia Quick Evaluation

## 2021-01-27 NOTE — H&P (View-Only) (Signed)
_0 @   Referring Provider: [facility]  Primary Care Physician:  [name], MD Primary Gastroenterologist:  Dr. Jenetta Downer  Date of Admission: 01/26/21 Date of Consultation: 01/27/21  Reason for Consultation: Acute blood loss anemia, history of upper GI bleed.  HPI:  John [name] is a 75 y.o. year old male with medical history significant for atrial fibrillation, aortic stenosis s/p TAVR, CHF, diabetes, HTN, HLD, NASH ?  Early cirrhosis, congestive hepatopathy, CKD, large cratered duodenal ulcer with nonbleeding visible vessel s/p GDA embolization in July, overdue for surveillance EGD, who presented to the emergency room yesterday with abdominal pain, constipation, nausea, and dark stool.  ED course: BP somewhat soft, but overall hemodynamically stable. Afebrile.  Hemoglobin 7.9 (down from 9.1, 3 weeks ago), WBC wnl, Cr 1.8 (at baseline), BUN elevated 111, Na and K wnl, alk phos 251, AST and ALT wnl, t bili 2.0.  CT A/P w/o contrast- Nodular hepatic contour compatible with cirrhotic changes, sigmoid diverticular disease with questionable mild stranding near descending/sigmoid junction suspected to be secondary to mild acute uncomplicated diverticulitis.  P.o. started on IV ceftriaxone and metronidazole, IV fluids, continued on Protonix 40 mg twice daily, critical diet.  CBG this morning with hemoglobin down to 6.6.  GI consulted due to concerns for acute blood loss anemia.   Today:  BMs have been fine, but over the last 2 days, she developed constipation, passing small hard black stools. Denies abdominal pain. Tried drinking warm prune juice and then vomited it all back up. Didn't see anything red. Hard to tell if anything was dark due to the prune juice. No further nausea or vomiting. Last passed any stool yesterday. Passing gas.   Reports he has been taking Protonix 40 mg BID at home. No reflux symptoms or dysphagia. Decreased appetite for the last few days. No fever or  chills.   No CP or SOB. No increased swelling. Taking aspirin. Eliquis has been started back yet. No other NSAIDs. No weakness, fatigue, or dizziness.   Has a lot of itching.   Last EGD 10/18/2020: Normal esophagus, erythematous mucosa in gastric body, nonbleeding duodenal ulcer with nonbleeding visible vessel injected and clipped.  Referred to IR for embolization.  Past Medical History:  Diagnosis Date   Atrial flutter ([place]) 12/2010   Admitted with symptomatic bradycardia (HR 40s), atrial flutter with slow ventricular response 12/2010 + volume overload; AV nodal agents d/c'd and Pradaxa started; RFA in 01/2011   Chronic kidney disease, stage 3b (HCC)    Chronic systolic CHF (congestive heart failure) (HCC)    Class 2 severe obesity due to excess calories with serious comorbidity and body mass index (BMI) of 37.0 to 37.9 in adult ([place]) 07/17/2017   Diabetes mellitus    non insulin dependant   Hematoma, chest wall 05/15/2020   Hyperlipidemia    Hypertension    Mitral regurgitation    NASH (nonalcoholic steatohepatitis)    NICM (nonischemic cardiomyopathy) (HCC)    Osteoarthritis    Polyarticular gout 05/15/2020   Presence of permanent cardiac pacemaker    PSVT (paroxysmal supraventricular tachycardia) (HCC)    Possibly atrial flutter   S/P TAVR (transcatheter aortic valve replacement) 05/03/2020   s/p TAVR with a 29 mm Edwards S3U via the subclavian approach with Dr. Angelena Form & Dr. Cyndia Bent    Severe aortic stenosis     Past Surgical History:  Procedure Laterality Date   ATRIAL FLUTTER ABLATION N/A 02/07/2011   Procedure: ATRIAL FLUTTER ABLATION;  Surgeon: Champ Mungo  Lovena Le, MD;  Location: [facility] CATH LAB;  Service: Cardiovascular;  Laterality: N/A;   BIV UPGRADE N/A 06/08/2020   Procedure: BIV PPM UPGRADE;  Surgeon: Evans Lance, MD;  Location: [place] CV LAB;  Service: Cardiovascular;  Laterality: N/A;   CARDIAC ELECTROPHYSIOLOGY STUDY AND ABLATION  02/07/2011   CARDIOVERSION  N/A 03/23/2016   Procedure: CARDIOVERSION;  Surgeon: Evans Lance, MD;  Location: [place];  Service: Cardiovascular;  Laterality: N/A;   COLONOSCOPY N/A 04/17/2017   Rehman: 3 polyps in [facility], diverticulosis   EP IMPLANTABLE DEVICE N/A 08/29/2015   Procedure: Pacemaker Implant;  Surgeon: Evans Lance, MD;  Location: [place] CV LAB;  Service: Cardiovascular;  Laterality: N/A;   EP IMPLANTABLE DEVICE N/A 12/07/2015   Procedure: PPM Lead Revision/Repair;  Surgeon: Evans Lance, MD;  Location: [place] CV LAB;  Service: Cardiovascular;  Laterality: N/A;   ESOPHAGOGASTRODUODENOSCOPY (EGD) WITH PROPOFOL N/A 10/18/2020   Castaneda: normal esophagus, erythematous mucosa in gastric body, non bleeding duodenal ulcer with a non bleeding visible vessel (forrest Class IIa). Injected, clip placed, no specimens collected.   IR ANGIOGRAM SELECTIVE EACH ADDITIONAL VESSEL  10/19/2020   IR ANGIOGRAM SELECTIVE EACH ADDITIONAL VESSEL  10/19/2020   IR ANGIOGRAM VISCERAL SELECTIVE  10/19/2020   IR ANGIOGRAM VISCERAL SELECTIVE  10/19/2020   IR EMBO ART  VEN HEMORR LYMPH EXTRAV  INC GUIDE ROADMAPPING  10/19/2020   IR US GUIDE VASC ACCESS RIGHT  10/19/2020   KNEE ARTHROSCOPY  03/27/2003   left   LUMBAR SPINE SURGERY     "I've had 6 ORs 1972 thru 2004"   POLYPECTOMY  04/17/2017   Procedure: POLYPECTOMY;  Surgeon: Rogene Houston, MD;  [facility];  Service: Endoscopy;;  transverse colon x3;   RIGHT HEART CATH N/A 04/28/2020   Procedure: RIGHT HEART CATH;  Surgeon: Jolaine Artist, MD;  Location: [place] CV LAB;  Service: Cardiovascular;  Laterality: N/A;   RIGHT/LEFT HEART CATH AND CORONARY ANGIOGRAPHY N/A 12/21/2019   Procedure: RIGHT/LEFT HEART CATH AND CORONARY ANGIOGRAPHY;  Surgeon: Nelva Bush, MD;  Location: [place] CV LAB;  Service: Cardiovascular;  Laterality: N/A;   TEE WITHOUT CARDIOVERSION N/A 11/05/2019   Procedure: TRANSESOPHAGEAL ECHOCARDIOGRAM (TEE) WITH  PROPOFOL;  Surgeon: Arnoldo Lenis, MD;  [facility];  Service: Endoscopy;  Laterality: N/A;   TEE WITHOUT CARDIOVERSION N/A 05/03/2020   Procedure: TRANSESOPHAGEAL ECHOCARDIOGRAM (TEE);  Surgeon: Burnell Blanks, MD;  Location: [place];  Service: Open Heart Surgery;  Laterality: N/A;    Prior to Admission medications   Medication Sig Start Date End Date Taking? Authorizing Provider  acetaminophen (TYLENOL) 325 MG tablet Take 2 tablets (650 mg total) by mouth every 4 (four) hours as needed for headache or mild pain. 05/15/20  Yes Isaiah Serge, NP  allopurinol (ZYLOPRIM) 300 MG tablet Take 0.5 tablets (150 mg total) by mouth daily. Patient taking differently: Take 150 mg by mouth as needed. 07/20/20  Yes Bensimhon, Shaune Pascal, MD  aspirin EC 81 MG tablet Take 1 tablet (81 mg total) by mouth daily with breakfast. 10/25/20 10/25/21 Yes Emokpae, Courage, MD  benzonatate (TESSALON) 100 MG capsule Take 1 capsule (100 mg total) by mouth 3 (three) times daily as needed for cough. 09/27/20  Yes Evans Lance, MD  camphor-menthol (ANTI-ITCH) lotion Apply 1 application topically daily as needed for itching.   Yes [provider]  carvedilol (COREG) 3.125 MG tablet TAKE 1 TABLET IN THE MORNING AND  2 TABLETS IN THE EVENING 07/20/20  Yes Bensimhon, Shaune Pascal, MD  colchicine 0.6 MG tablet Take 1 tablet (0.6 mg total) by mouth daily as needed. 05/15/20  Yes Isaiah Serge, NP  Dulaglutide (TRULICITY) 1.5 VQ/9.4HW SOPN INJECT 1.5MG (1 PEN) SUBCUTANEOUSLY EVERY WEEK Patient taking differently: Inject 1.5 mg into the skin once a week. Sunday 10/05/20  Yes Cassandria Anger, MD  hydrocortisone cream 1 % Apply 1 application topically daily.   Yes [provider]  insulin degludec (TRESIBA FLEXTOUCH) 100 UNIT/ML FlexTouch Pen Inject 40 Units into the skin at bedtime. 11/01/20  Yes Cassandria Anger, MD  linaclotide Rolan Lipa) 145 MCG CAPS capsule Take 1 capsule (145 mcg total) by  mouth daily before breakfast. 01/26/21  Yes Montez Morita, Quillian Quince, MD  metolazone (ZAROXOLYN) 2.5 MG tablet Take 1 tablet (2.5 mg total) by mouth once a week. Every Monday 01/17/21  Yes [place], Maricela Bo, FNP  Multiple Vitamins-Minerals (ONE-A-DAY MENS 50+) TABS Take 1 tablet by mouth daily with breakfast. Men   Yes [provider]  pantoprazole (PROTONIX) 40 MG tablet Take 40 mg by mouth 2 (two) times daily.   Yes [provider]  potassium chloride SA (KLOR-CON M20) 20 MEQ tablet Take 1.5 tablets (30 mEq total) by mouth 2 (two) times daily. Take 2 extra tabs on Mon with metolazone 12/12/20  Yes Bensimhon, Shaune Pascal, MD  spironolactone (ALDACTONE) 25 MG tablet Take 0.5 tablets (12.5 mg total) by mouth daily. 07/20/20  Yes Bensimhon, Shaune Pascal, MD  sucralfate (CARAFATE) 1 g tablet Take 1 tablet (1 g total) by mouth 4 (four) times daily. 10/22/20 10/22/21 Yes Emokpae, Courage, MD  torsemide (DEMADEX) 20 MG tablet TAKE 3 TABLETS (60 MG TOTAL) BY MOUTH 2 (TWO) TIMES DAILY. 11/01/20  Yes Bensimhon, Shaune Pascal, MD  ACCU-CHEK AVIVA PLUS test strip TEST BLOOD SUGAR TWICE DAILY BEFORE BREAKFAST AND AT BEDTIME 07/19/20   Cassandria Anger, MD  fexofenadine (ALLEGRA) 180 MG tablet fexofenadine 180 mg tablet  Take 1 tablet every day by oral route. Patient not taking: No sig reported    [provider]  Insulin Pen Needle (BD PEN NEEDLE NANO U/F) 32G X 4 MM MISC 1 each by Does not apply route 4 (four) times daily. 12/23/18   Cassandria Anger, MD  loratadine (CLARITIN) 10 MG tablet TAKE 1 TABLET BY MOUTH EVERY DAY Patient not taking: No sig reported 10/03/20   Bensimhon, Shaune Pascal, MD    Current Facility-Administered Medications  Medication Dose Route Frequency Provider Last Rate Last Admin   0.9 %  sodium chloride infusion   Intravenous Continuous Emokpae, Ejiroghene E, MD 50 mL/hr at 01/26/21 2302 New Bag at 01/26/21 2302   acetaminophen (TYLENOL) tablet 650 mg  650 mg Oral Q6H PRN  Emokpae, Ejiroghene E, MD       Or   acetaminophen (TYLENOL) suppository 650 mg  650 mg Rectal Q6H PRN Emokpae, Ejiroghene E, MD       aspirin EC tablet 81 mg  81 mg Oral Q breakfast Emokpae, Ejiroghene E, MD       carvedilol (COREG) tablet 3.125 mg  3.125 mg Oral BID WC Emokpae, Ejiroghene E, MD       cefTRIAXone (ROCEPHIN) 2 g in sodium chloride 0.9 % 100 mL IVPB  2 g Intravenous Q24H Emokpae, Ejiroghene E, MD   Stopped at 01/26/21 1856   enoxaparin (LOVENOX) injection 40 mg  40 mg Subcutaneous Q24H Emokpae, Ejiroghene E, MD   40  mg at 01/26/21 2256   insulin aspart (novoLOG) injection 0-5 Units  0-5 Units Subcutaneous QHS Emokpae, Ejiroghene E, MD       insulin aspart (novoLOG) injection 0-9 Units  0-9 Units Subcutaneous TID WC Emokpae, Ejiroghene E, MD       insulin glargine-yfgn (SEMGLEE) injection 10 Units  10 Units Subcutaneous QHS Emokpae, Ejiroghene E, MD   10 Units at 01/26/21 2319   linaclotide (LINZESS) capsule 145 mcg  145 mcg Oral QAC breakfast Emokpae, Ejiroghene E, MD       metroNIDAZOLE (FLAGYL) IVPB 500 mg  500 mg Intravenous Q12H Emokpae, Ejiroghene E, MD 100 mL/hr at 01/27/21 0532 500 mg at 01/27/21 0532   pantoprazole (PROTONIX) EC tablet 40 mg  40 mg Oral BID Emokpae, Ejiroghene E, MD   40 mg at 01/26/21 2255   polyethylene glycol (MIRALAX / GLYCOLAX) packet 17 g  17 g Oral BID Emokpae, Ejiroghene E, MD   17 g at 01/26/21 2255   promethazine (PHENERGAN) tablet 12.5 mg  12.5 mg Oral Q8H PRN Emokpae, Ejiroghene E, MD       senna-docusate (Senokot-S) tablet 1 tablet  1 tablet Oral BID Emokpae, Ejiroghene E, MD   1 tablet at 01/26/21 2256    Allergies as of 01/26/2021   (No Known Allergies)    Family History  Problem Relation Age of Onset   Heart attack Mother    Hypertension Mother    Heart attack Father    Hypertension Father    Heart attack Brother    Colon cancer Neg Hx     Social History   Socioeconomic History   Marital status: Married    Spouse name: Not  on file   Number of children: 2   Years of education: Not on file   Highest education level: Not on file  Occupational History   Occupation: Designer, industrial/product    Employer: RETIRED  Tobacco Use   Smoking status: Former    Packs/day: 1.00    Years: 10.00    Pack years: 10.00    Types: Cigarettes    Quit date: 03/26/1986    Years since quitting: 34.8   Smokeless tobacco: Never   Tobacco comments:    "stopped cigarette  smoking 1988"  Vaping Use   Vaping Use: Never used  Substance and Sexual Activity   Alcohol use: Not Currently    Alcohol/week: 0.0 standard drinks    Comment: "quit alcohol ~ 2007"   Drug use: No   Sexual activity: Yes    Partners: Female  Other Topics Concern   Not on file  Social History Narrative   Not on file   Social Determinants of Health   Financial Resource Strain: Not on file  Food Insecurity: No Food Insecurity   Worried About Estate manager/land agent of Food in the Last Year: Never true   [place] in the Last Year: Never true  Transportation Needs: No Transportation Needs   Lack of Transportation (Medical): No   Lack of Transportation (Non-Medical): No  Physical Activity: Not on file  Stress: Not on file  Social Connections: Not on file  Intimate Partner Violence: Not on file    Review of Systems: Gen: Denies fever, chills, cold or flulike symptoms, presyncope, syncope. CV: Denies chest pain, heart palpitations. Resp: Denies shortness of breath or cough. GI: See HPI GU : Denies urinary burning, urinary frequency, urinary incontinence.  MS: Denies joint pain. Derm: Admits to itching, no rash. Heme: See HPI  Physical Exam: Vital signs in last 24 hours: Temp:  [97.6 F (36.4 C)-99 F (37.2 C)] 99 F (37.2 C) (11/04 0729) Pulse Rate:  [52-114] 60 (11/04 0729) Resp:  [15-24] 15 (11/04 0729) BP: (105-128)/(46-64) 111/53 (11/04 0729) SpO2:  [95 %-100 %] 100 % (11/04 0729) FiO2 (%):  [21 %] 21 % (11/03 2122) Weight:  [97.5 kg] 97.5 kg  (11/03 1256) Last BM Date: 01/26/21 (per pt and was black) General:   Alert,  Well-developed, well-nourished, pleasant and cooperative in NAD Head:  Normocephalic and atraumatic. Eyes:  Sclera clear, no icterus.   Conjunctiva pink. Ears:  Normal auditory acuity. Lungs:  Few crackles noted in left lower lung base. No wheezes or rhonchi. No acute distress. Heart: Irregularly irregular rate and rhythm, no murmurs, clicks, rubs,  or gallops. Abdomen:  Soft, nontender and nondistended. No masses, hepatosplenomegaly or hernias noted. Normal bowel sounds, without guarding, and without rebound.   Rectal:  Deferred   Msk:  Symmetrical without gross deformities. Normal posture. Extremities:  Without edema. Neurologic:  Alert and  oriented x4;  grossly normal neurologically. Skin:  Intact without significant lesions or rashes. Psych:  Normal mood and affect.  Intake/Output from previous day: 11/03 0701 - 11/04 0700 In: 500 [P.O.:500] Out: 1300 [Urine:1300] Intake/Output this shift: No intake/output data recorded.  Lab Results: Recent Labs    01/26/21 1305 01/27/21 0529 01/27/21 0655  WBC 7.1 8.8  --   HGB 7.9* 6.6* 6.5*  HCT 24.6* 20.5* 20.0*  PLT 129* 133*  --    BMET Recent Labs    01/26/21 1305 01/27/21 0529  NA 136 PENDING  K 4.7 PENDING  CL 102 PENDING  CO2 25 PENDING  GLUCOSE 213* PENDING  BUN 111* 123*  CREATININE 1.80* PENDING  CALCIUM 8.4* PENDING   LFT Recent Labs    01/26/21 1305  PROT 6.7  ALBUMIN 2.8*  AST 35  ALT 22  ALKPHOS 251*  BILITOT 2.0*    Studies/Results: CT ABDOMEN PELVIS WO CONTRAST  Result Date: 01/26/2021 CLINICAL DATA:  Acute abdominal pain in the LEFT lower quadrant with nausea and vomiting that began this morning in a 75 year old male EXAM: CT ABDOMEN AND PELVIS WITHOUT CONTRAST TECHNIQUE: Multidetector CT imaging of the abdomen and pelvis was performed following the standard protocol without IV contrast. COMPARISON:  April 30, 2020.  FINDINGS: Lower chest: Signs of trans arterial aortic valve replacement seen on scout images. Pacer device in place, incompletely evaluated, lead in the RIGHT heart as before. Small RIGHT-sided pleural effusion is diminished from more remote imaging. Mild septal thickening the lung bases without dense consolidative process. Lung base assessment mildly limited due to respiratory motion. Hepatobiliary: Nodular hepatic contours compatible with cirrhotic morphologic changes. Cholelithiasis, contracted gallbladder about gallstones. No gross lesion on noncontrast imaging. Pancreas: No stranding about the pancreas to the extent evaluated due to streak artifact from gastroduodenal artery coiling. Pancreatic atrophy as before. Spleen: Normal. Adrenals/Urinary Tract: Adrenal glands are normal. Smooth contour the bilateral kidneys. Chronic perinephric stranding is unchanged. Venous collateral pathways about the RIGHT urinary bladder similarly unchanged. No substantial perivesical stranding, nephrolithiasis, ureteral calculi or hydronephrosis. Stomach/Bowel: Sigmoid diverticular disease with question of mild stranding near the descending/sigmoid junction. No abscess or free air. Normal appendix. Stomach is distended without signs of adjacent stranding. Small bowel is nondilated. Ovoid low-density structure adjacent to the third portion of the duodenum measures 4.4 x 3.2 cm previously 5.1 x 3.6 cm and December of 2021 and not substantially changed compared  to imaging from February of 2022. Vascular/Lymphatic: Aortic atherosclerosis. No sign of aneurysm. Smooth contour of the IVC. There is no gastrohepatic or hepatoduodenal ligament lymphadenopathy. No retroperitoneal or mesenteric lymphadenopathy. No pelvic sidewall lymphadenopathy. Limited assessment of vascular structures without intravenous contrast on today's study. Reproductive: Unremarkable aside from adjacent collateral vessels near the urinary bladder and prostate gland,  not well assessed given lack of contrast. Other: Moderate LEFT small RIGHT inguinal hernias containing fat. Musculoskeletal: No acute bone finding. No destructive bone process. Spinal degenerative changes. Degenerative changes are marked and worse at the L3-4 level. IMPRESSION: Suspect mild acute uncomplicated sigmoid diverticulitis at the descending/sigmoid junction. Pancolonic diverticulosis. Hepatic cirrhosis. Small RIGHT-sided pleural effusion is diminished from more remote imaging. 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 Cholelithiasis, contracted gallbladder about gallstones. Aortic Atherosclerosis (ICD10-I70.0). Electronically Signed   By: Zetta Bills M.D.   On: 01/26/2021 17:18    Impression: 75 y.o. year old male with medical history significant for atrial fibrillation, aortic stenosis s/p TAVR, CHF, diabetes, HTN, HLD, NASH ?  Early cirrhosis, congestive hepatopathy, CKD, large cratered duodenal ulcer with nonbleeding visible vessel s/p GDA embolization in July, overdue for surveillance EGD, who presented to the emergency room yesterday with abdominal pain, constipation, nausea, and dark stool, found to have acute decline in hemoglobin to 7.9, elevated BUN, and CT A/P without contrast with possible acute uncomplicated sigmoid diverticulitis.GI consulted due to acute blood loss anemia.   Acute blood loss anemia: Hemoglobin down to 7.9 on admission from 9.1, 3 weeks ago.  Hemoglobin 6.6 this morning.  BUN elevated at 111> 123 today. Patient reports couple days of dark stools prior to admission, last BM yesterday.  Has been compliant with PPI twice daily outpatient.  Chronically on aspirin and previously anticoagulated on Eliquis, but this has not been resumed since prior GI bleed in July.  Received Lovenox last night.  Known history of nonbleeding duodenal ulcer with nonbleeding  visible vessel noted at time of EGD in July requiring IR embolization, no esophageal varices noted at that time.  Query whether patient is bleeding from prior duodenal ulcer versus other site such as AMV, would not anticipate variceal bleed as no varices were noted previously. He will need repeat EGD for further evaluation.  Diverticulitis: Possible acute uncomplicated sigmoid diverticulitis noted on CT without contrast.  Clinically, patient is asymptomatic today.  Can continue antibiotics.  NASH?  Early cirrhosis: Clinically doing well.  No symptoms of decompensated liver disease.  Labs fairly stable overall with elevated alk phos at 251 and T bili 2.0, fairly consistent with baseline.  He does report itching.  Antimitochondrial antibody previously negative.  He will need outpatient follow-up with Dr. Jenetta Downer.  Plan: Check INR  Agree with transfusing 2 unit PRBCs, ordered by hospitalist. NPO EGD with propofol with Dr. Abbey Chatters today. The risks, benefits, and alternatives have been discussed with the patient in detail. The patient states understanding and desires to proceed. *He will need at least 1 unit PRBCs transfused by the time of EGD this afternoon.  May nursing staff aware. Continue IV PPI twice daily. Continue antibiotics for diverticulitis.  Current regimen is also covering for SBP prophylaxis in the setting of cirrhotic with GI bleed. Outpatient cirrhosis care.    LOS: 0 days    01/27/2021, 7:47 AM   Aliene Altes, [facility]  [place]

## 2021-01-27 NOTE — Interval H&P Note (Signed)
History and Physical Interval Note:  01/27/2021 10:57 AM  John [name]  has presented today for surgery, with the diagnosis of acute blood loss anemia, melena, history of duodenal ulcer with nonbleeding visible vessel s/p IR embolization in July.  The various methods of treatment have been discussed with the patient and family. After consideration of risks, benefits and other options for treatment, the patient has consented to  Procedure(s): ESOPHAGOGASTRODUODENOSCOPY (EGD) WITH PROPOFOL (N/A) as a surgical intervention.  The patient's history has been reviewed, patient examined, no change in status, stable for surgery.  I have reviewed the patient's chart and labs.  Questions were answered to the patient's satisfaction.     [name]

## 2021-01-27 NOTE — Consult Note (Addendum)
_0 @   Referring Provider: [facility]  Primary Care Physician:  [name], MD Primary Gastroenterologist:  Dr. Jenetta Downer  Date of Admission: 01/26/21 Date of Consultation: 01/27/21  Reason for Consultation: Acute blood loss anemia, history of upper GI bleed.  HPI:  John [name] is a 75 y.o. year old male with medical history significant for atrial fibrillation, aortic stenosis s/p TAVR, CHF, diabetes, HTN, HLD, NASH ?  Early cirrhosis, congestive hepatopathy, CKD, large cratered duodenal ulcer with nonbleeding visible vessel s/p GDA embolization in July, overdue for surveillance EGD, who presented to the emergency room yesterday with abdominal pain, constipation, nausea, and dark stool.  ED course: BP somewhat soft, but overall hemodynamically stable. Afebrile.  Hemoglobin 7.9 (down from 9.1, 3 weeks ago), WBC wnl, Cr 1.8 (at baseline), BUN elevated 111, Na and K wnl, alk phos 251, AST and ALT wnl, t bili 2.0.  CT A/P w/o contrast- Nodular hepatic contour compatible with cirrhotic changes, sigmoid diverticular disease with questionable mild stranding near descending/sigmoid junction suspected to be secondary to mild acute uncomplicated diverticulitis.  P.o. started on IV ceftriaxone and metronidazole, IV fluids, continued on Protonix 40 mg twice daily, critical diet.  CBG this morning with hemoglobin down to 6.6.  GI consulted due to concerns for acute blood loss anemia.   Today:  BMs have been fine, but over the last 2 days, she developed constipation, passing small hard black stools. Denies abdominal pain. Tried drinking warm prune juice and then vomited it all back up. Didn't see anything red. Hard to tell if anything was dark due to the prune juice. No further nausea or vomiting. Last passed any stool yesterday. Passing gas.   Reports he has been taking Protonix 40 mg BID at home. No reflux symptoms or dysphagia. Decreased appetite for the last few days. No fever or  chills.   No CP or SOB. No increased swelling. Taking aspirin. Eliquis has been started back yet. No other NSAIDs. No weakness, fatigue, or dizziness.   Has a lot of itching.   Last EGD 10/18/2020: Normal esophagus, erythematous mucosa in gastric body, nonbleeding duodenal ulcer with nonbleeding visible vessel injected and clipped.  Referred to IR for embolization.  Past Medical History:  Diagnosis Date   Atrial flutter ([place]) 12/2010   Admitted with symptomatic bradycardia (HR 40s), atrial flutter with slow ventricular response 12/2010 + volume overload; AV nodal agents d/c'd and Pradaxa started; RFA in 01/2011   Chronic kidney disease, stage 3b (HCC)    Chronic systolic CHF (congestive heart failure) (HCC)    Class 2 severe obesity due to excess calories with serious comorbidity and body mass index (BMI) of 37.0 to 37.9 in adult ([place]) 07/17/2017   Diabetes mellitus    non insulin dependant   Hematoma, chest wall 05/15/2020   Hyperlipidemia    Hypertension    Mitral regurgitation    NASH (nonalcoholic steatohepatitis)    NICM (nonischemic cardiomyopathy) (HCC)    Osteoarthritis    Polyarticular gout 05/15/2020   Presence of permanent cardiac pacemaker    PSVT (paroxysmal supraventricular tachycardia) (HCC)    Possibly atrial flutter   S/P TAVR (transcatheter aortic valve replacement) 05/03/2020   s/p TAVR with a 29 mm Edwards S3U via the subclavian approach with Dr. Angelena Form & Dr. Cyndia Bent    Severe aortic stenosis     Past Surgical History:  Procedure Laterality Date   ATRIAL FLUTTER ABLATION N/A 02/07/2011   Procedure: ATRIAL FLUTTER ABLATION;  Surgeon: Champ Mungo  Lovena Le, MD;  Location: [facility] CATH LAB;  Service: Cardiovascular;  Laterality: N/A;   BIV UPGRADE N/A 06/08/2020   Procedure: BIV PPM UPGRADE;  Surgeon: Evans Lance, MD;  Location: [place] CV LAB;  Service: Cardiovascular;  Laterality: N/A;   CARDIAC ELECTROPHYSIOLOGY STUDY AND ABLATION  02/07/2011   CARDIOVERSION  N/A 03/23/2016   Procedure: CARDIOVERSION;  Surgeon: Evans Lance, MD;  Location: [place];  Service: Cardiovascular;  Laterality: N/A;   COLONOSCOPY N/A 04/17/2017   Rehman: 3 polyps in [facility], diverticulosis   EP IMPLANTABLE DEVICE N/A 08/29/2015   Procedure: Pacemaker Implant;  Surgeon: Evans Lance, MD;  Location: [place] CV LAB;  Service: Cardiovascular;  Laterality: N/A;   EP IMPLANTABLE DEVICE N/A 12/07/2015   Procedure: PPM Lead Revision/Repair;  Surgeon: Evans Lance, MD;  Location: [place] CV LAB;  Service: Cardiovascular;  Laterality: N/A;   ESOPHAGOGASTRODUODENOSCOPY (EGD) WITH PROPOFOL N/A 10/18/2020   Castaneda: normal esophagus, erythematous mucosa in gastric body, non bleeding duodenal ulcer with a non bleeding visible vessel (forrest Class IIa). Injected, clip placed, no specimens collected.   IR ANGIOGRAM SELECTIVE EACH ADDITIONAL VESSEL  10/19/2020   IR ANGIOGRAM SELECTIVE EACH ADDITIONAL VESSEL  10/19/2020   IR ANGIOGRAM VISCERAL SELECTIVE  10/19/2020   IR ANGIOGRAM VISCERAL SELECTIVE  10/19/2020   IR EMBO ART  VEN HEMORR LYMPH EXTRAV  INC GUIDE ROADMAPPING  10/19/2020   IR US GUIDE VASC ACCESS RIGHT  10/19/2020   KNEE ARTHROSCOPY  03/27/2003   left   LUMBAR SPINE SURGERY     "I've had 6 ORs 1972 thru 2004"   POLYPECTOMY  04/17/2017   Procedure: POLYPECTOMY;  Surgeon: Rogene Houston, MD;  [facility];  Service: Endoscopy;;  transverse colon x3;   RIGHT HEART CATH N/A 04/28/2020   Procedure: RIGHT HEART CATH;  Surgeon: Jolaine Artist, MD;  Location: [place] CV LAB;  Service: Cardiovascular;  Laterality: N/A;   RIGHT/LEFT HEART CATH AND CORONARY ANGIOGRAPHY N/A 12/21/2019   Procedure: RIGHT/LEFT HEART CATH AND CORONARY ANGIOGRAPHY;  Surgeon: Nelva Bush, MD;  Location: [place] CV LAB;  Service: Cardiovascular;  Laterality: N/A;   TEE WITHOUT CARDIOVERSION N/A 11/05/2019   Procedure: TRANSESOPHAGEAL ECHOCARDIOGRAM (TEE) WITH  PROPOFOL;  Surgeon: Arnoldo Lenis, MD;  [facility];  Service: Endoscopy;  Laterality: N/A;   TEE WITHOUT CARDIOVERSION N/A 05/03/2020   Procedure: TRANSESOPHAGEAL ECHOCARDIOGRAM (TEE);  Surgeon: Burnell Blanks, MD;  Location: [place];  Service: Open Heart Surgery;  Laterality: N/A;    Prior to Admission medications   Medication Sig Start Date End Date Taking? Authorizing Provider  acetaminophen (TYLENOL) 325 MG tablet Take 2 tablets (650 mg total) by mouth every 4 (four) hours as needed for headache or mild pain. 05/15/20  Yes Isaiah Serge, NP  allopurinol (ZYLOPRIM) 300 MG tablet Take 0.5 tablets (150 mg total) by mouth daily. Patient taking differently: Take 150 mg by mouth as needed. 07/20/20  Yes Bensimhon, Shaune Pascal, MD  aspirin EC 81 MG tablet Take 1 tablet (81 mg total) by mouth daily with breakfast. 10/25/20 10/25/21 Yes Emokpae, Courage, MD  benzonatate (TESSALON) 100 MG capsule Take 1 capsule (100 mg total) by mouth 3 (three) times daily as needed for cough. 09/27/20  Yes Evans Lance, MD  camphor-menthol (ANTI-ITCH) lotion Apply 1 application topically daily as needed for itching.   Yes [provider]  carvedilol (COREG) 3.125 MG tablet TAKE 1 TABLET IN THE MORNING AND  2 TABLETS IN THE EVENING 07/20/20  Yes Bensimhon, Daniel R, MD  colchicine 0.6 MG tablet Take 1 tablet (0.6 mg total) by mouth daily as needed. 05/15/20  Yes Ingold, Laura R, NP  Dulaglutide (TRULICITY) 1.5 MG/0.5ML SOPN INJECT 1.5MG (1 PEN) SUBCUTANEOUSLY EVERY WEEK Patient taking differently: Inject 1.5 mg into the skin once a week. Sunday 10/05/20  Yes Nida, Gebreselassie W, MD  hydrocortisone cream 1 % Apply 1 application topically daily.   Yes [provider]  insulin degludec (TRESIBA FLEXTOUCH) 100 UNIT/ML FlexTouch Pen Inject 40 Units into the skin at bedtime. 11/01/20  Yes Nida, Gebreselassie W, MD  linaclotide (LINZESS) 145 MCG CAPS capsule Take 1 capsule (145 mcg total) by  mouth daily before breakfast. 01/26/21  Yes Castaneda Mayorga, Daniel, MD  metolazone (ZAROXOLYN) 2.5 MG tablet Take 1 tablet (2.5 mg total) by mouth once a week. Every Monday 01/17/21  Yes Milford, Jessica M, FNP  Multiple Vitamins-Minerals (ONE-A-DAY MENS 50+) TABS Take 1 tablet by mouth daily with breakfast. Men   Yes [provider]  pantoprazole (PROTONIX) 40 MG tablet Take 40 mg by mouth 2 (two) times daily.   Yes [provider]  potassium chloride SA (KLOR-CON M20) 20 MEQ tablet Take 1.5 tablets (30 mEq total) by mouth 2 (two) times daily. Take 2 extra tabs on Mon with metolazone 12/12/20  Yes Bensimhon, Daniel R, MD  spironolactone (ALDACTONE) 25 MG tablet Take 0.5 tablets (12.5 mg total) by mouth daily. 07/20/20  Yes Bensimhon, Daniel R, MD  sucralfate (CARAFATE) 1 g tablet Take 1 tablet (1 g total) by mouth 4 (four) times daily. 10/22/20 10/22/21 Yes Emokpae, Courage, MD  torsemide (DEMADEX) 20 MG tablet TAKE 3 TABLETS (60 MG TOTAL) BY MOUTH 2 (TWO) TIMES DAILY. 11/01/20  Yes Bensimhon, Daniel R, MD  ACCU-CHEK AVIVA PLUS test strip TEST BLOOD SUGAR TWICE DAILY BEFORE BREAKFAST AND AT BEDTIME 07/19/20   Nida, Gebreselassie W, MD  fexofenadine (ALLEGRA) 180 MG tablet fexofenadine 180 mg tablet  Take 1 tablet every day by oral route. Patient not taking: No sig reported    [provider]  Insulin Pen Needle (BD PEN NEEDLE NANO U/F) 32G X 4 MM MISC 1 each by Does not apply route 4 (four) times daily. 12/23/18   Nida, Gebreselassie W, MD  loratadine (CLARITIN) 10 MG tablet TAKE 1 TABLET BY MOUTH EVERY DAY Patient not taking: No sig reported 10/03/20   Bensimhon, Daniel R, MD    Current Facility-Administered Medications  Medication Dose Route Frequency Provider Last Rate Last Admin   0.9 %  sodium chloride infusion   Intravenous Continuous Emokpae, Ejiroghene E, MD 50 mL/hr at 01/26/21 2302 New Bag at 01/26/21 2302   acetaminophen (TYLENOL) tablet 650 mg  650 mg Oral Q6H PRN  Emokpae, Ejiroghene E, MD       Or   acetaminophen (TYLENOL) suppository 650 mg  650 mg Rectal Q6H PRN Emokpae, Ejiroghene E, MD       aspirin EC tablet 81 mg  81 mg Oral Q breakfast Emokpae, Ejiroghene E, MD       carvedilol (COREG) tablet 3.125 mg  3.125 mg Oral BID WC Emokpae, Ejiroghene E, MD       cefTRIAXone (ROCEPHIN) 2 g in sodium chloride 0.9 % 100 mL IVPB  2 g Intravenous Q24H Emokpae, Ejiroghene E, MD   Stopped at 01/26/21 1856   enoxaparin (LOVENOX) injection 40 mg  40 mg Subcutaneous Q24H Emokpae, Ejiroghene E, MD   40   mg at 01/26/21 2256   insulin aspart (novoLOG) injection 0-5 Units  0-5 Units Subcutaneous QHS Emokpae, Ejiroghene E, MD       insulin aspart (novoLOG) injection 0-9 Units  0-9 Units Subcutaneous TID WC Emokpae, Ejiroghene E, MD       insulin glargine-yfgn (SEMGLEE) injection 10 Units  10 Units Subcutaneous QHS Emokpae, Ejiroghene E, MD   10 Units at 01/26/21 2319   linaclotide (LINZESS) capsule 145 mcg  145 mcg Oral QAC breakfast Emokpae, Ejiroghene E, MD       metroNIDAZOLE (FLAGYL) IVPB 500 mg  500 mg Intravenous Q12H Emokpae, Ejiroghene E, MD 100 mL/hr at 01/27/21 0532 500 mg at 01/27/21 0532   pantoprazole (PROTONIX) EC tablet 40 mg  40 mg Oral BID Emokpae, Ejiroghene E, MD   40 mg at 01/26/21 2255   polyethylene glycol (MIRALAX / GLYCOLAX) packet 17 g  17 g Oral BID Emokpae, Ejiroghene E, MD   17 g at 01/26/21 2255   promethazine (PHENERGAN) tablet 12.5 mg  12.5 mg Oral Q8H PRN Emokpae, Ejiroghene E, MD       senna-docusate (Senokot-S) tablet 1 tablet  1 tablet Oral BID Emokpae, Ejiroghene E, MD   1 tablet at 01/26/21 2256    Allergies as of 01/26/2021   (No Known Allergies)    Family History  Problem Relation Age of Onset   Heart attack Mother    Hypertension Mother    Heart attack Father    Hypertension Father    Heart attack Brother    Colon cancer Neg Hx     Social History   Socioeconomic History   Marital status: Married    Spouse name: Not  on file   Number of children: 2   Years of education: Not on file   Highest education level: Not on file  Occupational History   Occupation: Designer, industrial/product    Employer: RETIRED  Tobacco Use   Smoking status: Former    Packs/day: 1.00    Years: 10.00    Pack years: 10.00    Types: Cigarettes    Quit date: 03/26/1986    Years since quitting: 34.8   Smokeless tobacco: Never   Tobacco comments:    "stopped cigarette  smoking 1988"  Vaping Use   Vaping Use: Never used  Substance and Sexual Activity   Alcohol use: Not Currently    Alcohol/week: 0.0 standard drinks    Comment: "quit alcohol ~ 2007"   Drug use: No   Sexual activity: Yes    Partners: Female  Other Topics Concern   Not on file  Social History Narrative   Not on file   Social Determinants of Health   Financial Resource Strain: Not on file  Food Insecurity: No Food Insecurity   Worried About Estate manager/land agent of Food in the Last Year: Never true   [place] in the Last Year: Never true  Transportation Needs: No Transportation Needs   Lack of Transportation (Medical): No   Lack of Transportation (Non-Medical): No  Physical Activity: Not on file  Stress: Not on file  Social Connections: Not on file  Intimate Partner Violence: Not on file    Review of Systems: Gen: Denies fever, chills, cold or flulike symptoms, presyncope, syncope. CV: Denies chest pain, heart palpitations. Resp: Denies shortness of breath or cough. GI: See HPI GU : Denies urinary burning, urinary frequency, urinary incontinence.  MS: Denies joint pain. Derm: Admits to itching, no rash. Heme: See HPI  Physical Exam: Vital signs in last 24 hours: Temp:  [97.6 F (36.4 C)-99 F (37.2 C)] 99 F (37.2 C) (11/04 0729) Pulse Rate:  [52-114] 60 (11/04 0729) Resp:  [15-24] 15 (11/04 0729) BP: (105-128)/(46-64) 111/53 (11/04 0729) SpO2:  [95 %-100 %] 100 % (11/04 0729) FiO2 (%):  [21 %] 21 % (11/03 2122) Weight:  [97.5 kg] 97.5 kg  (11/03 1256) Last BM Date: 01/26/21 (per pt and was black) General:   Alert,  Well-developed, well-nourished, pleasant and cooperative in NAD Head:  Normocephalic and atraumatic. Eyes:  Sclera clear, no icterus.   Conjunctiva pink. Ears:  Normal auditory acuity. Lungs:  Few crackles noted in left lower lung base. No wheezes or rhonchi. No acute distress. Heart: Irregularly irregular rate and rhythm, no murmurs, clicks, rubs,  or gallops. Abdomen:  Soft, nontender and nondistended. No masses, hepatosplenomegaly or hernias noted. Normal bowel sounds, without guarding, and without rebound.   Rectal:  Deferred   Msk:  Symmetrical without gross deformities. Normal posture. Extremities:  Without edema. Neurologic:  Alert and  oriented x4;  grossly normal neurologically. Skin:  Intact without significant lesions or rashes. Psych:  Normal mood and affect.  Intake/Output from previous day: 11/03 0701 - 11/04 0700 In: 500 [P.O.:500] Out: 1300 [Urine:1300] Intake/Output this shift: No intake/output data recorded.  Lab Results: Recent Labs    01/26/21 1305 01/27/21 0529 01/27/21 0655  WBC 7.1 8.8  --   HGB 7.9* 6.6* 6.5*  HCT 24.6* 20.5* 20.0*  PLT 129* 133*  --    BMET Recent Labs    01/26/21 1305 01/27/21 0529  NA 136 PENDING  K 4.7 PENDING  CL 102 PENDING  CO2 25 PENDING  GLUCOSE 213* PENDING  BUN 111* 123*  CREATININE 1.80* PENDING  CALCIUM 8.4* PENDING   LFT Recent Labs    01/26/21 1305  PROT 6.7  ALBUMIN 2.8*  AST 35  ALT 22  ALKPHOS 251*  BILITOT 2.0*    Studies/Results: CT ABDOMEN PELVIS WO CONTRAST  Result Date: 01/26/2021 CLINICAL DATA:  Acute abdominal pain in the LEFT lower quadrant with nausea and vomiting that began this morning in a 75 year old male EXAM: CT ABDOMEN AND PELVIS WITHOUT CONTRAST TECHNIQUE: Multidetector CT imaging of the abdomen and pelvis was performed following the standard protocol without IV contrast. COMPARISON:  April 30, 2020.  FINDINGS: Lower chest: Signs of trans arterial aortic valve replacement seen on scout images. Pacer device in place, incompletely evaluated, lead in the RIGHT heart as before. Small RIGHT-sided pleural effusion is diminished from more remote imaging. Mild septal thickening the lung bases without dense consolidative process. Lung base assessment mildly limited due to respiratory motion. Hepatobiliary: Nodular hepatic contours compatible with cirrhotic morphologic changes. Cholelithiasis, contracted gallbladder about gallstones. No gross lesion on noncontrast imaging. Pancreas: No stranding about the pancreas to the extent evaluated due to streak artifact from gastroduodenal artery coiling. Pancreatic atrophy as before. Spleen: Normal. Adrenals/Urinary Tract: Adrenal glands are normal. Smooth contour the bilateral kidneys. Chronic perinephric stranding is unchanged. Venous collateral pathways about the RIGHT urinary bladder similarly unchanged. No substantial perivesical stranding, nephrolithiasis, ureteral calculi or hydronephrosis. Stomach/Bowel: Sigmoid diverticular disease with question of mild stranding near the descending/sigmoid junction. No abscess or free air. Normal appendix. Stomach is distended without signs of adjacent stranding. Small bowel is nondilated. Ovoid low-density structure adjacent to the third portion of the duodenum measures 4.4 x 3.2 cm previously 5.1 x 3.6 cm and December of 2021 and not substantially changed compared  to imaging from February of 2022. Vascular/Lymphatic: Aortic atherosclerosis. No sign of aneurysm. Smooth contour of the IVC. There is no gastrohepatic or hepatoduodenal ligament lymphadenopathy. No retroperitoneal or mesenteric lymphadenopathy. No pelvic sidewall lymphadenopathy. Limited assessment of vascular structures without intravenous contrast on today's study. Reproductive: Unremarkable aside from adjacent collateral vessels near the urinary bladder and prostate gland,  not well assessed given lack of contrast. Other: Moderate LEFT small RIGHT inguinal hernias containing fat. Musculoskeletal: No acute bone finding. No destructive bone process. Spinal degenerative changes. Degenerative changes are marked and worse at the L3-4 level. IMPRESSION: Suspect mild acute uncomplicated sigmoid diverticulitis at the descending/sigmoid junction. Pancolonic diverticulosis. Hepatic cirrhosis. Small RIGHT-sided pleural effusion is diminished from more remote imaging. 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 Cholelithiasis, contracted gallbladder about gallstones. Aortic Atherosclerosis (ICD10-I70.0). Electronically Signed   By: Geoffrey  Wile M.D.   On: 01/26/2021 17:18    Impression: 75 y.o. year old male with medical history significant for atrial fibrillation, aortic stenosis s/p TAVR, CHF, diabetes, HTN, HLD, NASH ?  Early cirrhosis, congestive hepatopathy, CKD, large cratered duodenal ulcer with nonbleeding visible vessel s/p GDA embolization in July, overdue for surveillance EGD, who presented to the emergency room yesterday with abdominal pain, constipation, nausea, and dark stool, found to have acute decline in hemoglobin to 7.9, elevated BUN, and CT A/P without contrast with possible acute uncomplicated sigmoid diverticulitis.GI consulted due to acute blood loss anemia.   Acute blood loss anemia: Hemoglobin down to 7.9 on admission from 9.1, 3 weeks ago.  Hemoglobin 6.6 this morning.  BUN elevated at 111> 123 today. Patient reports couple days of dark stools prior to admission, last BM yesterday.  Has been compliant with PPI twice daily outpatient.  Chronically on aspirin and previously anticoagulated on Eliquis, but this has not been resumed since prior GI bleed in July.  Received Lovenox last night.  Known history of nonbleeding duodenal ulcer with nonbleeding  visible vessel noted at time of EGD in July requiring IR embolization, no esophageal varices noted at that time.  Query whether patient is bleeding from prior duodenal ulcer versus other site such as AMV, would not anticipate variceal bleed as no varices were noted previously. He will need repeat EGD for further evaluation.  Diverticulitis: Possible acute uncomplicated sigmoid diverticulitis noted on CT without contrast.  Clinically, patient is asymptomatic today.  Can continue antibiotics.  NASH?  Early cirrhosis: Clinically doing well.  No symptoms of decompensated liver disease.  Labs fairly stable overall with elevated alk phos at 251 and T bili 2.0, fairly consistent with baseline.  He does report itching.  Antimitochondrial antibody previously negative.  He will need outpatient follow-up with Dr. Castaneda.  Plan: Check INR  Agree with transfusing 2 unit PRBCs, ordered by hospitalist. NPO EGD with propofol with Dr. Carver today. The risks, benefits, and alternatives have been discussed with the patient in detail. The patient states understanding and desires to proceed. *He will need at least 1 unit PRBCs transfused by the time of EGD this afternoon.  May nursing staff aware. Continue IV PPI twice daily. Continue antibiotics for diverticulitis.  Current regimen is also covering for SBP prophylaxis in the setting of cirrhotic with GI bleed. Outpatient cirrhosis care.    LOS: 0 days    01/27/2021, 7:47 AM   Aitana Burry, PA-C [facility]  [place] 

## 2021-01-27 NOTE — Op Note (Signed)
[facility] Patient Name: John [name] Procedure Date: 01/27/2021 11:37 AM MRN: [id number] Date of Birth: 1945-08-21 Attending MD: Elon Alas. Abbey Chatters DO CSN: [id number] Age: 75 Admit Type: Inpatient Procedure:                Upper GI endoscopy Indications:              Acute post hemorrhagic anemia, Melena Providers:                Elon Alas. Abbey Chatters, DO, Lambert Mody, Nelma Rothman, Technician Referring MD:              Medicines:                See the Anesthesia note for documentation of the                            administered medications Complications:            No immediate complications. Estimated Blood Loss:     Estimated blood loss: none. Procedure:                Pre-Anesthesia Assessment:                           - The anesthesia plan was to use monitored                            anesthesia care (MAC).                           After obtaining informed consent, the endoscope was                            passed under direct vision. Throughout the                            procedure, the patient's blood pressure, pulse, and                            oxygen saturations were monitored continuously. The                            GIF-H190 ([id number]) scope was introduced through the                            mouth, and advanced to the second part of duodenum.                            The upper GI endoscopy was accomplished without                            difficulty. The patient tolerated the procedure                            well. Scope In: 11:44:31 AM  Scope Out: 11:46:37 AM Total Procedure Duration: 0 hours 2 minutes 6 seconds  Findings:      A small hiatal hernia was present.      A non-obstructing Schatzki ring was found in the distal esophagus.      Moderate portal hypertensive gastropathy was found in the gastric fundus       and in the gastric body.      A 20 mm healed ulcer was found in the duodenal bulb. The scar  was       unremarkable in appearance. No active ulceration. No stigmata or active       bleeding Impression:               - Small hiatal hernia.                           - Non-obstructing Schatzki ring.                           - Portal hypertensive gastropathy.                           - Duodenal scar.                           - No specimens collected. Moderate Sedation:      Per Anesthesia Care Recommendation:           - Return patient to hospital ward for ongoing care.                           - Advance diet as tolerated.                           - Continue on twice daily PPI                           - Follow up with Dr. Jenetta Downer for cirrhosis mgmt                           - Will add on Miralax BID for constipation Procedure Code(s):        --- Professional ---                           [phone], Esophagogastroduodenoscopy, flexible,                            transoral; diagnostic, including collection of                            specimen(s) by brushing or washing, when performed                            (separate procedure) Diagnosis Code(s):        --- Professional ---                           K44.9, Diaphragmatic hernia without obstruction or  gangrene                           K22.2, Esophageal obstruction                           K76.6, Portal hypertension                           K31.89, Other diseases of stomach and duodenum                           D62, Acute posthemorrhagic anemia                           K92.1, Melena (includes Hematochezia) CPT copyright 2019 [employer]. All rights reserved. The codes documented in this report are preliminary and upon coder review may  be revised to meet current compliance requirements. Elon Alas. Abbey Chatters, DO [place] Abbey Chatters, DO 01/27/2021 11:54:18 AM This report has been signed electronically. Number of Addenda: 0

## 2021-01-27 NOTE — Progress Notes (Signed)
RN called due to patient's hemoglobin at 6.6 this morning, this was 7.9 yesterday.  There was no sign of bleeding.  H/H will be repeated.

## 2021-01-27 NOTE — Anesthesia Postprocedure Evaluation (Signed)
Anesthesia Post Note  Patient: John [name]  Procedure(s) Performed: ESOPHAGOGASTRODUODENOSCOPY (EGD) WITH PROPOFOL  Patient location during evaluation: Phase II Anesthesia Type: General Level of consciousness: awake Pain management: pain level controlled Vital Signs Assessment: post-procedure vital signs reviewed and stable Respiratory status: spontaneous breathing and respiratory function stable Cardiovascular status: blood pressure returned to baseline and stable Postop Assessment: no headache and no apparent nausea or vomiting Anesthetic complications: no Comments: Late entry   No notable events documented.   Last Vitals:  Vitals:   01/27/21 1220 01/27/21 1238  BP: (!) 96/44 128/90  Pulse: 66 62  Resp: 16   Temp: 36.4 C 36.5 C  SpO2: 100% 100%    Last Pain:  Vitals:   01/27/21 1238  TempSrc: Oral  PainSc:                  Louann Sjogren

## 2021-01-27 NOTE — Addendum Note (Signed)
Addendum  created 01/27/21 1347 by Karna Dupes, CRNA   Charge Capture section accepted, Visit diagnoses modified

## 2021-01-27 NOTE — Progress Notes (Signed)
PROGRESS NOTE  John [name] XBJ:[id number] DOB: 09-18-1945 DOA: 01/26/2021 PCP: [name], MD  Brief History:  76 year old male with a history of systolic and diastolic CHF, diabetes mellitus type 2, hypertension, NASH cirrhosis, CKD stage IIIb, complete heart block status post PPM, hyperlipidemia, persistent atrial fibrillation, upper GI bleed presenting with abdominal pain and associated nausea and vomiting.  The patient is a difficult historian.  Much of this history is obtained from review of medical record and speaking with the patient's spouse.  The patient has had lower abdominal pain for about 2 days.  On 02/22/2021, the patient had an episode of nausea and vomiting that was dark in nature.  He denies any hematochezia, melena, hematemesis.  In fact the patient states that he feels constipated.  His last BM was on 01/24/2021.  He denies any dysuria, hematuria, fevers, chills, chest pain, headache, neck pain, coughing, hemoptysis.  The patient has had some orthopnea type symptoms and some dyspnea on exertion.  He has gained about 5 pounds in the past week.  In the emergency department, the patient was afebrile hemodynamically stable with oxygen saturation 100% room air.  BMP showed a sodium 136, potassium 4.7, CO2 25, BUN 111, serum creatinine 1.0.  LFTs were unremarkable.  WBC 7.1, hemoglobin 7.9, platelets 129,000.  CT of the abdomen and pelvis showed sigmoid diverticular disease with mild stranding near the descending colon/sigmoid colon junction.  There is no abscess or free air.  There is a stable ovoid density anterior portion of the duodenum.  There is chronic perinephric stranding.  The patient was started on ceftriaxone and azithromycin.  Assessment/Plan: Acute diverticulitis -Continue ceftriaxone and metronidazole IV -Continue clear liquid diets for now -Gradually advance diet after GI work-up is done  Acute blood loss anemia -Baseline hemoglobin ~9 -7/25 to 7/30  admit with UGI bleed -7/26 EGD--cratered duodenal ulcer with visible blood vessel status post epi injection and unsuccessful hemoclips.  The patient required IR embolization during that hospitalization. -Hemoglobin has dropped to 6.6 -Transfused 2 units PRBC -BUN is 111 -GI consult  Acute on chronic systolic and diastolic CHF -The patient is mildly hypervolemic -10/7-10/10 admit for acute on chronic combined CHF--discharge weight 94.8 kg -01/26/2021 admission weight 97.5 kg -Spouse states that patient has gained about 5 pounds in the past 2 weeks -Start IV furosemide -Daily weights -11/05/2019 TEE EF 35-40%, mild decreased RV function, moderate TR/MR, global HK -01/01/2021 echo EF 30-35%, global HK, low normal RV, mod to moderate MR, moderate TR  CKD stage IIIb -Baseline creatinine 1.8-2.0 -Monitor with diuresis  Persistent atrial fibrillation -Patient previously on anticoagulation but this has been discontinued secondary to GI bleed and cratered duodenal ulcer with visible vessel -Rate controlled  Diabetes mellitus type 2, uncontrolled with hyperglycemia -NovoLog sliding scale -Holding Trulicity and Tresiba -30/10/6576 hemoglobin A1c 9.2  Complete heart block -Status post permanent pacemaker   Hyperlipidemia -Holding statin temporarily  S/p TAVR -04/2020  Goals of Care -DNR--confirmed with spouse and patient      Status is: Observation  The patient will require care spanning > 2 midnights and should be moved to inpatient because: decompensated CHF requiring IV lasix and GIB requiring inpatient work up       Family Communication:   spouse updated 11/4  Consultants:  GI  Code Status:  DNR  DVT Prophylaxis:  SCDs   Procedures: As Listed in Progress Note Above  Antibiotics: Ceftriaxone 11/3>> Metronidazole 11/3>>  Subjective: Patient denies fevers, chills, headache, chest pain, dyspnea, nausea, vomiting, diarrhea, abdominal pain, dysuria,  hematuria, hematochezia, and melena.   Objective: Vitals:   01/26/21 2122 01/27/21 0011 01/27/21 0500 01/27/21 0729  BP:  105/62 (!) 118/58 (!) 111/53  Pulse:  76 60 60  Resp:  17 19 15   Temp:  97.8 F (36.6 C) 97.7 F (36.5 C) 99 F (37.2 C)  TempSrc:  Oral  Oral  SpO2: 100% 100% 100% 100%  Weight:      Height:        Intake/Output Summary (Last 24 hours) at 01/27/2021 0745 Last data filed at 01/27/2021 0400 Gross per 24 hour  Intake 500 ml  Output 1300 ml  Net -800 ml   Weight change:  Exam:  General:  Pt is alert, follows commands appropriately, not in acute distress HEENT: No icterus, No thrush, No neck mass, [place]/AT Cardiovascular: IRRR, S1/S2, no rubs, no gallops Respiratory: bibasilar crackles.  No wheeze Abdomen: Soft/+BS, non tender, non distended, no guarding Extremities: trace LE edema, No lymphangitis, No petechiae, No rashes, no synovitis   Data Reviewed: I have personally reviewed following labs and imaging studies Basic Metabolic Panel: Recent Labs  Lab 01/26/21 1305 01/27/21 0529  NA 136 PENDING  K 4.7 PENDING  CL 102 PENDING  CO2 25 PENDING  GLUCOSE 213* PENDING  BUN 111* 123*  CREATININE 1.80* PENDING  CALCIUM 8.4* PENDING   Liver Function Tests: Recent Labs  Lab 01/26/21 1305  AST 35  ALT 22  ALKPHOS 251*  BILITOT 2.0*  PROT 6.7  ALBUMIN 2.8*   Recent Labs  Lab 01/26/21 1305  LIPASE 39   No results for input(s): AMMONIA in the last 168 hours. Coagulation Profile: No results for input(s): INR, PROTIME in the last 168 hours. CBC: Recent Labs  Lab 01/26/21 1305 01/27/21 0529 01/27/21 0655  WBC 7.1 8.8  --   NEUTROABS 4.8  --   --   HGB 7.9* 6.6* 6.5*  HCT 24.6* 20.5* 20.0*  MCV 82.3 82.7  --   PLT 129* 133*  --    Cardiac Enzymes: No results for input(s): CKTOTAL, CKMB, CKMBINDEX, TROPONINI in the last 168 hours. BNP: Invalid input(s): POCBNP CBG: Recent Labs  Lab 01/26/21 2109 01/27/21 0723  GLUCAP 172* 146*    HbA1C: No results for input(s): HGBA1C in the last 72 hours. Urine analysis:    Component Value Date/Time   COLORURINE STRAW (A) 01/26/2021 1328   APPEARANCEUR CLEAR 01/26/2021 1328   LABSPEC 1.008 01/26/2021 1328   PHURINE 6.0 01/26/2021 1328   GLUCOSEU NEGATIVE 01/26/2021 1328   HGBUR NEGATIVE 01/26/2021 1328   BILIRUBINUR NEGATIVE 01/26/2021 1328   KETONESUR NEGATIVE 01/26/2021 1328   PROTEINUR NEGATIVE 01/26/2021 1328   UROBILINOGEN 0.2 01/05/2014 0744   NITRITE NEGATIVE 01/26/2021 1328   LEUKOCYTESUR NEGATIVE 01/26/2021 1328   Sepsis Labs: @LABRCNTIP (procalcitonin:4,lacticidven:4) ) Recent Results (from the past 240 hour(s))  Resp Panel by RT-PCR (Flu A&B, Covid) Nasopharyngeal Swab     Status: None   Collection Time: 01/26/21  6:45 PM   Specimen: Nasopharyngeal Swab; Nasopharyngeal(NP) swabs in vial transport medium  Result Value Ref Range Status   SARS Coronavirus 2 by RT PCR NEGATIVE NEGATIVE Final    Comment: (NOTE) SARS-CoV-2 target nucleic acids are NOT DETECTED.  The SARS-CoV-2 RNA is generally detectable in upper respiratory specimens during the acute phase of infection. The lowest concentration of SARS-CoV-2 viral copies this assay can detect is 138 copies/mL. A negative result does  not preclude SARS-Cov-2 infection and should not be used as the sole basis for treatment or other patient management decisions. A negative result may occur with  improper specimen collection/handling, submission of specimen other than nasopharyngeal swab, presence of viral mutation(s) within the areas targeted by this assay, and inadequate number of viral copies(<138 copies/mL). A negative result must be combined with clinical observations, patient history, and epidemiological information. The expected result is Negative.  Fact Sheet for Patients:  EntrepreneurPulse.com.au  Fact Sheet for Healthcare Providers:   IncredibleEmployment.be  This test is no t yet approved or cleared by the [place] FDA and  has been authorized for detection and/or diagnosis of SARS-CoV-2 by FDA under an Emergency Use Authorization (EUA). This EUA will remain  in effect (meaning this test can be used) for the duration of the COVID-19 declaration under Section 564(b)(1) of the Act, 21 U.S.C.section 360bbb-3(b)(1), unless the authorization is terminated  or revoked sooner.       Influenza A by PCR NEGATIVE NEGATIVE Final   Influenza B by PCR NEGATIVE NEGATIVE Final    Comment: (NOTE) The Xpert Xpress SARS-CoV-2/FLU/RSV plus assay is intended as an aid in the diagnosis of influenza from Nasopharyngeal swab specimens and should not be used as a sole basis for treatment. Nasal washings and aspirates are unacceptable for Xpert Xpress SARS-CoV-2/FLU/RSV testing.  Fact Sheet for Patients: EntrepreneurPulse.com.au  Fact Sheet for Healthcare Providers: IncredibleEmployment.be  This test is not yet approved or cleared by the [place] FDA and has been authorized for detection and/or diagnosis of SARS-CoV-2 by FDA under an Emergency Use Authorization (EUA). This EUA will remain in effect (meaning this test can be used) for the duration of the COVID-19 declaration under Section 564(b)(1) of the Act, 21 U.S.C. section 360bbb-3(b)(1), unless the authorization is terminated or revoked.  Performed at [facility], [address] [place], [place] [address]      Scheduled Meds:  aspirin EC  81 mg Oral Q breakfast   carvedilol  3.125 mg Oral BID WC   enoxaparin (LOVENOX) injection  40 mg Subcutaneous Q24H   insulin aspart  0-5 Units Subcutaneous QHS   insulin aspart  0-9 Units Subcutaneous TID WC   insulin glargine-yfgn  10 Units Subcutaneous QHS   linaclotide  145 mcg Oral QAC breakfast   pantoprazole  40 mg Oral BID   polyethylene glycol  17 g Oral BID    senna-docusate  1 tablet Oral BID   Continuous Infusions:  sodium chloride 50 mL/hr at 01/26/21 2302   cefTRIAXone (ROCEPHIN)  IV Stopped (01/26/21 1856)   metronidazole 500 mg (01/27/21 0532)    Procedures/Studies: CT ABDOMEN PELVIS WO CONTRAST  Result Date: 01/26/2021 CLINICAL DATA:  Acute abdominal pain in the LEFT lower quadrant with nausea and vomiting that began this morning in a 75 year old male EXAM: CT ABDOMEN AND PELVIS WITHOUT CONTRAST TECHNIQUE: Multidetector CT imaging of the abdomen and pelvis was performed following the standard protocol without IV contrast. COMPARISON:  April 30, 2020. FINDINGS: Lower chest: Signs of trans arterial aortic valve replacement seen on scout images. Pacer device in place, incompletely evaluated, lead in the RIGHT heart as before. Small RIGHT-sided pleural effusion is diminished from more remote imaging. Mild septal thickening the lung bases without dense consolidative process. Lung base assessment mildly limited due to respiratory motion. Hepatobiliary: Nodular hepatic contours compatible with cirrhotic morphologic changes. Cholelithiasis, contracted gallbladder about gallstones. No gross lesion on noncontrast imaging. Pancreas: No stranding about the pancreas to the  extent evaluated due to streak artifact from gastroduodenal artery coiling. Pancreatic atrophy as before. Spleen: Normal. Adrenals/Urinary Tract: Adrenal glands are normal. Smooth contour the bilateral kidneys. Chronic perinephric stranding is unchanged. Venous collateral pathways about the RIGHT urinary bladder similarly unchanged. No substantial perivesical stranding, nephrolithiasis, ureteral calculi or hydronephrosis. Stomach/Bowel: Sigmoid diverticular disease with question of mild stranding near the descending/sigmoid junction. No abscess or free air. Normal appendix. Stomach is distended without signs of adjacent stranding. Small bowel is nondilated. Ovoid low-density structure adjacent  to the third portion of the duodenum measures 4.4 x 3.2 cm previously 5.1 x 3.6 cm and December of 2021 and not substantially changed compared to imaging from February of 2022. Vascular/Lymphatic: Aortic atherosclerosis. No sign of aneurysm. Smooth contour of the IVC. There is no gastrohepatic or hepatoduodenal ligament lymphadenopathy. No retroperitoneal or mesenteric lymphadenopathy. No pelvic sidewall lymphadenopathy. Limited assessment of vascular structures without intravenous contrast on today's study. Reproductive: Unremarkable aside from adjacent collateral vessels near the urinary bladder and prostate gland, not well assessed given lack of contrast. Other: Moderate LEFT small RIGHT inguinal hernias containing fat. Musculoskeletal: No acute bone finding. No destructive bone process. Spinal degenerative changes. Degenerative changes are marked and worse at the L3-4 level. IMPRESSION: Suspect mild acute uncomplicated sigmoid diverticulitis at the descending/sigmoid junction. Pancolonic diverticulosis. Hepatic cirrhosis. Small RIGHT-sided pleural effusion is diminished from more remote imaging. 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 Cholelithiasis, contracted gallbladder about gallstones. Aortic Atherosclerosis (ICD10-I70.0). Electronically Signed   By: Zetta Bills M.D.   On: 01/26/2021 17:18   DG Chest 2 View  Result Date: 12/30/2020 CLINICAL DATA:  Shortness of breath EXAM: CHEST - 2 VIEW COMPARISON:  10/17/2020 FINDINGS: Left-sided pacing device and valve prosthesis as before. Mild cardiomegaly without overt edema. Mild diffuse reticular opacity suggesting underlying chronic disease. No consolidation or pneumothorax. IMPRESSION: No active cardiopulmonary disease.  Mild cardiomegaly. Electronically Signed   By: Donavan Foil M.D.   On: 12/30/2020 16:11   ECHOCARDIOGRAM  COMPLETE  Result Date: 01/01/2021    ECHOCARDIOGRAM REPORT   Patient Name:   EVELIO RUEDA Date of Exam: 01/01/2021 Medical Rec #:  [id number]        Height:       72.0 in Accession #:    [id number]       Weight:       209.8 lb Date of Birth:  07/26/1945        BSA:          2.174 m Patient Age:    37 years         BP:           112/66 mmHg Patient Gender: M                HR:           60 bpm. Exam Location:  Inpatient Procedure: 2D Echo Indications:    acute systolic CHF  History:        Patient has prior history of Echocardiogram examinations, most                 recent 06/23/2020. Pacemaker, chronic kidney disease,                 Arrythmias:Atrial Fibrillation, Signs/Symptoms:Shortness of                 Breath; Risk Factors:Diabetes, Hypertension  and Dyslipidemia.                 Aortic Valve: 29 mm Edwards Sapien prosthetic, stented (TAVR)                 valve is present in the aortic position.  Sonographer:    Johny Chess RDCS Referring Phys: [id number] [place]  1. Left ventricular ejection fraction, by estimation, is 30 to 35%. The left ventricle has moderately decreased function. The left ventricle demonstrates global hypokinesis. The left ventricular internal cavity size was mildly dilated. Left ventricular diastolic function could not be evaluated.  2. Right ventricular systolic function is low normal. The right ventricular size is moderately enlarged. There is mildly elevated pulmonary artery systolic pressure.  3. Left atrial size was severely dilated.  4. Right atrial size was severely dilated.  5. The mitral valve is normal in structure. Mild to moderate mitral valve regurgitation.  6. Tricuspid valve regurgitation is moderate.  7. The aortic valve has been repaired/replaced. Aortic valve regurgitation is not visualized. No aortic stenosis is present. There is a 29 mm Edwards Sapien prosthetic (TAVR) valve present in the aortic position. Aortic valve mean gradient  measures 10.0  mmHg. Aortic valve Vmax measures 2.13 m/s.  8. The inferior vena cava is dilated in size with <50% respiratory variability, suggesting right atrial pressure of 15 mmHg. Comparison(s): No significant change from prior study. Prior images reviewed side by side. FINDINGS  Left Ventricle: Left ventricular ejection fraction, by estimation, is 30 to 35%. The left ventricle has moderately decreased function. The left ventricle demonstrates global hypokinesis. The left ventricular internal cavity size was mildly dilated. There is no left ventricular hypertrophy. Abnormal (paradoxical) septal motion, consistent with RV pacemaker. Left ventricular diastolic function could not be evaluated due to atrial fibrillation. Left ventricular diastolic function could not be evaluated. Right Ventricle: The right ventricular size is moderately enlarged. No increase in right ventricular wall thickness. Right ventricular systolic function is low normal. There is mildly elevated pulmonary artery systolic pressure. The tricuspid regurgitant  velocity is 2.40 m/s, and with an assumed right atrial pressure of 15 mmHg, the estimated right ventricular systolic pressure is 45.4 mmHg. Left Atrium: Left atrial size was severely dilated. Right Atrium: Right atrial size was severely dilated. Pericardium: There is no evidence of pericardial effusion. Mitral Valve: The mitral valve is normal in structure. Mild to moderate mitral valve regurgitation, with centrally-directed jet. Tricuspid Valve: The tricuspid valve is normal in structure. Tricuspid valve regurgitation is moderate. Aortic Valve: The aortic valve has been repaired/replaced. Aortic valve regurgitation is not visualized. No aortic stenosis is present. Aortic valve mean gradient measures 10.0 mmHg. Aortic valve peak gradient measures 18.1 mmHg. Aortic valve area, by VTI measures 1.41 cm. There is a 29 mm Edwards Sapien prosthetic, stented (TAVR) valve present in the aortic  position. Pulmonic Valve: The pulmonic valve was normal in structure. Pulmonic valve regurgitation is trivial. Aorta: The aortic root and ascending aorta are structurally normal, with no evidence of dilitation. Venous: The inferior vena cava is dilated in size with less than 50% respiratory variability, suggesting right atrial pressure of 15 mmHg. IAS/Shunts: No atrial level shunt detected by color flow Doppler. Additional Comments: A device lead is visualized in the right ventricle.  LEFT VENTRICLE PLAX 2D LVIDd:         5.80 cm      Diastology LVIDs:         4.70 cm  LV e' lateral: 11.10 cm/s LV PW:         1.30 cm LV IVS:        0.90 cm LVOT diam:     2.00 cm LV SV:         58 LV SV Index:   27 LVOT Area:     3.14 cm  LV Volumes (MOD) LV vol d, MOD A4C: 153.0 ml LV vol s, MOD A4C: 107.0 ml LV SV MOD A4C:     153.0 ml RIGHT VENTRICLE             IVC RV S prime:     14.10 cm/s  IVC diam: 2.80 cm TAPSE (M-mode): 1.6 cm LEFT ATRIUM              Index        RIGHT ATRIUM           Index LA diam:        5.30 cm  2.44 cm/m   RA Area:     27.20 cm LA Vol (A2C):   106.0 ml 48.75 ml/m  RA Volume:   88.30 ml  40.61 ml/m LA Vol (A4C):   98.1 ml  45.12 ml/m LA Biplane Vol: 103.0 ml 47.37 ml/m  AORTIC VALVE AV Area (Vmax):    1.44 cm AV Area (Vmean):   1.39 cm AV Area (VTI):     1.41 cm AV Vmax:           213.00 cm/s AV Vmean:          141.000 cm/s AV VTI:            0.412 m AV Peak Grad:      18.1 mmHg AV Mean Grad:      10.0 mmHg LVOT Vmax:         97.30 cm/s LVOT Vmean:        62.400 cm/s LVOT VTI:          0.185 m LVOT/AV VTI ratio: 0.45  AORTA Ao Asc diam: 3.30 cm TRICUSPID VALVE TR Peak grad:   23.0 mmHg TR Vmax:        240.00 cm/s  SHUNTS Systemic VTI:  0.18 m Systemic Diam: 2.00 cm Dani Gobble Croitoru MD Electronically signed by Sanda Klein MD Signature Date/Time: 01/01/2021/2:27:58 PM    Final    CUP PACEART REMOTE DEVICE CHECK  Result Date: 01/15/2021 Scheduled remote reviewed. Normal device function.   AF burden 100%. Ave V response 60's-70's bpm. No OAC due to bleed. Next remote 91 days. LH   Orson Eva, DO  [facility]  If 7PM-7AM, please contact night-coverage [email or web address] Password [email or web address] 01/27/2021, 7:45 AM   LOS: 0 days

## 2021-01-28 DIAGNOSIS — E782 Mixed hyperlipidemia: Secondary | ICD-10-CM

## 2021-01-28 DIAGNOSIS — I10 Essential (primary) hypertension: Secondary | ICD-10-CM

## 2021-01-28 LAB — BASIC METABOLIC PANEL
Anion gap: 10 (ref 5–15)
BUN: 131 mg/dL — ABNORMAL HIGH (ref 8–23)
CO2: 22 mmol/L (ref 22–32)
Calcium: 8.2 mg/dL — ABNORMAL LOW (ref 8.9–10.3)
Chloride: 100 mmol/L (ref 98–111)
Creatinine, Ser: 2.12 mg/dL — ABNORMAL HIGH (ref 0.61–1.24)
GFR, Estimated: 32 mL/min — ABNORMAL LOW (ref >60)
Glucose, Bld: 144 mg/dL — ABNORMAL HIGH (ref 70–99)
Potassium: 4 mmol/L (ref 3.5–5.1)
Sodium: 132 mmol/L — ABNORMAL LOW (ref 135–145)

## 2021-01-28 LAB — CBC
HCT: 23.8 % — ABNORMAL LOW (ref 39.0–52.0)
Hemoglobin: 7.8 g/dL — ABNORMAL LOW (ref 13.0–17.0)
MCH: 27.3 pg (ref 26.0–34.0)
MCHC: 32.8 g/dL (ref 30.0–36.0)
MCV: 83.2 fL (ref 80.0–100.0)
Platelets: 145 10*3/uL — ABNORMAL LOW (ref 150–400)
RBC: 2.86 MIL/uL — ABNORMAL LOW (ref 4.22–5.81)
RDW: 22.5 % — ABNORMAL HIGH (ref 11.5–15.5)
WBC: 9.1 10*3/uL (ref 4.0–10.5)
nRBC: 0 % (ref 0.0–0.2)

## 2021-01-28 LAB — GLUCOSE, CAPILLARY
Glucose-Capillary: 121 mg/dL — ABNORMAL HIGH (ref 70–99)
Glucose-Capillary: 151 mg/dL — ABNORMAL HIGH (ref 70–99)
Glucose-Capillary: 173 mg/dL — ABNORMAL HIGH (ref 70–99)
Glucose-Capillary: 191 mg/dL — ABNORMAL HIGH (ref 70–99)

## 2021-01-28 LAB — MAGNESIUM: Magnesium: 2.5 mg/dL — ABNORMAL HIGH (ref 1.7–2.4)

## 2021-01-28 NOTE — Progress Notes (Signed)
PROGRESS NOTE  John [name] AGT:[id number] DOB: 10-Dec-1945 DOA: 01/26/2021 PCP: [name], MD      Jearld Adjutant History:  75 year old male with a history of systolic and diastolic CHF, diabetes mellitus type 2, hypertension, NASH cirrhosis, CKD stage IIIb, complete heart block status post PPM, hyperlipidemia, persistent atrial fibrillation, upper GI bleed presenting with abdominal pain and associated nausea and vomiting.  The patient is a difficult historian.  Much of this history is obtained from review of medical record and speaking with the patient's spouse.  The patient has had lower abdominal pain for about 2 days.  On 02/22/2021, the patient had an episode of nausea and vomiting that was dark in nature.  He denies any hematochezia, melena, hematemesis.  In fact the patient states that he feels constipated.  His last BM was on 01/24/2021.  He denies any dysuria, hematuria, fevers, chills, chest pain, headache, neck pain, coughing, hemoptysis.  The patient has had some orthopnea type symptoms and some dyspnea on exertion.  He has gained about 5 pounds in the past week.   In the emergency department, the patient was afebrile hemodynamically stable with oxygen saturation 100% room air.  BMP showed a sodium 136, potassium 4.7, CO2 25, BUN 111, serum creatinine 1.0.  LFTs were unremarkable.  WBC 7.1, hemoglobin 7.9, platelets 129,000.  CT of the abdomen and pelvis showed sigmoid diverticular disease with mild stranding near the descending colon/sigmoid colon junction.  There is no abscess or free air.  There is a stable ovoid density anterior portion of the duodenum.  There is chronic perinephric stranding.  The patient was started on ceftriaxone and azithromycin.   Assessment/Plan: Acute diverticulitis -Continue ceftriaxone and metronidazole IV -Continue clear liquid diet>>advance to soft diet -Gradually advance diet after GI work-up is done   Acute blood loss anemia -Baseline  hemoglobin ~9 -7/25 to 7/30 admit with UGI bleed -7/26 EGD--cratered duodenal ulcer with visible blood vessel status post epi injection and unsuccessful hemoclips.  The patient required IR embolization during that hospitalization. -Hemoglobin has dropped to 6.6 -Transfused 2 units PRBC -BUN is 111 at time of admission -GI consult appreciated -11/4 EGD-- nonobstructive schatzki ring; portal HTN gastropathy; 20 mm healed ulcer in duodenal bulb without stigmata of bleeding -11/5--no melanotic stools in past 24 hours--check h/h in am   Acute on chronic systolic and diastolic CHF -The patient is mildly hypervolemic -10/7-10/10 admit for acute on chronic combined CHF--discharge weight 94.8 kg -01/26/2021 admission weight 97.5 kg -Spouse states that patient has gained about 5 pounds in the past 2 weeks -Start IV furosemide>>hold after 11/5 dose -Daily weights -11/05/2019 TEE EF 35-40%, mild decreased RV function, moderate TR/MR, global HK -01/01/2021 echo EF 30-35%, global HK, low normal RV, mod to moderate MR, moderate TR   CKD stage IIIb -Baseline creatinine 1.8-2.0 -Monitor with diuresis   Persistent atrial fibrillation -Patient previously on anticoagulation but this has been discontinued secondary to GI bleed and cratered duodenal ulcer with visible vessel -Rate controlled   Diabetes mellitus type 2, uncontrolled with hyperglycemia -NovoLog sliding scale -Holding Trulicity and Tresiba -22/06/8248 hemoglobin A1c 9.2   Complete heart block -Status post permanent pacemaker   Hyperlipidemia -Holding statin temporarily   S/p TAVR -04/2020   Goals of Care -DNR--confirmed with spouse and patient           Status is: Inpatient   The patient will require care spanning > 2 midnights and should be  moved to inpatient because: decompensated CHF requiring IV lasix and GIB requiring inpatient work up             Family Communication:   spouse updated 11/5   Consultants:  GI    Code Status:  DNR   DVT Prophylaxis:  SCDs     Procedures: As Listed in Progress Note Above   Antibiotics: Ceftriaxone 11/3>> Metronidazole 11/3>>   Subjective: Patient denies fevers, chills, headache, chest pain, dyspnea, nausea, vomiting, diarrhea, abdominal pain, dysuria, hematuria, hematochezia,.  Has had 2 melenotic stools in last 24 hours    Objective: Vitals:   01/27/21 2054 01/28/21 0451 01/28/21 0857 01/28/21 1348  BP: (!) 111/57 116/62 (!) 101/45 (!) 142/108  Pulse: 65 (!) 57 (!) 59 74  Resp: 18 18  15   Temp: 98.2 F (36.8 C) 97.7 F (36.5 C)  99.2 F (37.3 C)  TempSrc:  Oral    SpO2: 98% 97%  100%  Weight:      Height:        Intake/Output Summary (Last 24 hours) at 01/28/2021 1510 Last data filed at 01/28/2021 1300 Gross per 24 hour  Intake 1120.83 ml  Output 1300 ml  Net -179.17 ml   Weight change:  Exam:  General:  Pt is alert, follows commands appropriately, not in acute distress HEENT: No icterus, No thrush, No neck mass, [place]/AT Cardiovascular: IRRR, S1/S2, no rubs, no gallops Respiratory: CTA bilaterally, no wheezing, no crackles, no rhonchi Abdomen: Soft/+BS, non tender, non distended, no guarding Extremities: No edema, No lymphangitis, No petechiae, No rashes, no synovitis   Data Reviewed: I have personally reviewed following labs and imaging studies Basic Metabolic Panel: Recent Labs  Lab 01/26/21 1305 01/27/21 0529 01/28/21 0501  NA 136 135 132*  K 4.7 4.4 4.0  CL 102 102 100  CO2 25 24 22   GLUCOSE 213* 161* 144*  BUN 111* 123* 131*  CREATININE 1.80* 1.88* 2.12*  CALCIUM 8.4* 8.2* 8.2*  MG  --  2.5* 2.5*   Liver Function Tests: Recent Labs  Lab 01/26/21 1305  AST 35  ALT 22  ALKPHOS 251*  BILITOT 2.0*  PROT 6.7  ALBUMIN 2.8*   Recent Labs  Lab 01/26/21 1305  LIPASE 39   No results for input(s): AMMONIA in the last 168 hours. Coagulation Profile: Recent Labs  Lab 01/27/21 0928  INR 1.5*   CBC: Recent Labs   Lab 01/26/21 1305 01/27/21 0529 01/27/21 0655 01/28/21 0501  WBC 7.1 8.8  --  9.1  NEUTROABS 4.8  --   --   --   HGB 7.9* 6.6* 6.5* 7.8*  HCT 24.6* 20.5* 20.0* 23.8*  MCV 82.3 82.7  --  83.2  PLT 129* 133*  --  145*   Cardiac Enzymes: No results for input(s): CKTOTAL, CKMB, CKMBINDEX, TROPONINI in the last 168 hours. BNP: Invalid input(s): POCBNP CBG: Recent Labs  Lab 01/27/21 1206 01/27/21 1609 01/27/21 2054 01/28/21 0726 01/28/21 1050  GLUCAP 176* 217* 196* 121* 151*   HbA1C: No results for input(s): HGBA1C in the last 72 hours. Urine analysis:    Component Value Date/Time   COLORURINE STRAW (A) 01/26/2021 1328   APPEARANCEUR CLEAR 01/26/2021 1328   LABSPEC 1.008 01/26/2021 1328   PHURINE 6.0 01/26/2021 1328   GLUCOSEU NEGATIVE 01/26/2021 1328   HGBUR NEGATIVE 01/26/2021 1328   BILIRUBINUR NEGATIVE 01/26/2021 1328   KETONESUR NEGATIVE 01/26/2021 1328   PROTEINUR NEGATIVE 01/26/2021 1328   UROBILINOGEN 0.2 01/05/2014 0744   NITRITE  NEGATIVE 01/26/2021 1328   LEUKOCYTESUR NEGATIVE 01/26/2021 1328   Sepsis Labs: @LABRCNTIP (procalcitonin:4,lacticidven:4) ) Recent Results (from the past 240 hour(s))  Resp Panel by RT-PCR (Flu A&B, Covid) Nasopharyngeal Swab     Status: None   Collection Time: 01/26/21  6:45 PM   Specimen: Nasopharyngeal Swab; Nasopharyngeal(NP) swabs in vial transport medium  Result Value Ref Range Status   SARS Coronavirus 2 by RT PCR NEGATIVE NEGATIVE Final    Comment: (NOTE) SARS-CoV-2 target nucleic acids are NOT DETECTED.  The SARS-CoV-2 RNA is generally detectable in upper respiratory specimens during the acute phase of infection. The lowest concentration of SARS-CoV-2 viral copies this assay can detect is 138 copies/mL. A negative result does not preclude SARS-Cov-2 infection and should not be used as the sole basis for treatment or other patient management decisions. A negative result may occur with  improper specimen  collection/handling, submission of specimen other than nasopharyngeal swab, presence of viral mutation(s) within the areas targeted by this assay, and inadequate number of viral copies(<138 copies/mL). A negative result must be combined with clinical observations, patient history, and epidemiological information. The expected result is Negative.  Fact Sheet for Patients:  EntrepreneurPulse.com.au  Fact Sheet for Healthcare Providers:  IncredibleEmployment.be  This test is no t yet approved or cleared by the [place] FDA and  has been authorized for detection and/or diagnosis of SARS-CoV-2 by FDA under an Emergency Use Authorization (EUA). This EUA will remain  in effect (meaning this test can be used) for the duration of the COVID-19 declaration under Section 564(b)(1) of the Act, 21 U.S.C.section 360bbb-3(b)(1), unless the authorization is terminated  or revoked sooner.       Influenza A by PCR NEGATIVE NEGATIVE Final   Influenza B by PCR NEGATIVE NEGATIVE Final    Comment: (NOTE) The Xpert Xpress SARS-CoV-2/FLU/RSV plus assay is intended as an aid in the diagnosis of influenza from Nasopharyngeal swab specimens and should not be used as a sole basis for treatment. Nasal washings and aspirates are unacceptable for Xpert Xpress SARS-CoV-2/FLU/RSV testing.  Fact Sheet for Patients: EntrepreneurPulse.com.au  Fact Sheet for Healthcare Providers: IncredibleEmployment.be  This test is not yet approved or cleared by the [place] FDA and has been authorized for detection and/or diagnosis of SARS-CoV-2 by FDA under an Emergency Use Authorization (EUA). This EUA will remain in effect (meaning this test can be used) for the duration of the COVID-19 declaration under Section 564(b)(1) of the Act, 21 U.S.C. section 360bbb-3(b)(1), unless the authorization is terminated or revoked.  Performed at [facility], [address], [place], [place] [address]      Scheduled Meds:  aspirin EC  81 mg Oral Q breakfast   insulin aspart  0-5 Units Subcutaneous QHS   insulin aspart  0-9 Units Subcutaneous TID WC   insulin glargine-yfgn  10 Units Subcutaneous QHS   linaclotide  145 mcg Oral QAC breakfast   pantoprazole (PROTONIX) IV  40 mg Intravenous Q12H   polyethylene glycol  17 g Oral BID   Continuous Infusions:  cefTRIAXone (ROCEPHIN)  IV 2 g (01/27/21 1714)   metronidazole 500 mg (01/28/21 2751)    Procedures/Studies: CT ABDOMEN PELVIS WO CONTRAST  Result Date: 01/26/2021 CLINICAL DATA:  Acute abdominal pain in the LEFT lower quadrant with nausea and vomiting that began this morning in a 75 year old male EXAM: CT ABDOMEN AND PELVIS WITHOUT CONTRAST TECHNIQUE: Multidetector CT imaging of the abdomen and pelvis was performed following the standard protocol without IV contrast. COMPARISON:  April 30, 2020. FINDINGS: Lower chest: Signs of trans arterial aortic valve replacement seen on scout images. Pacer device in place, incompletely evaluated, lead in the RIGHT heart as before. Small RIGHT-sided pleural effusion is diminished from more remote imaging. Mild septal thickening the lung bases without dense consolidative process. Lung base assessment mildly limited due to respiratory motion. Hepatobiliary: Nodular hepatic contours compatible with cirrhotic morphologic changes. Cholelithiasis, contracted gallbladder about gallstones. No gross lesion on noncontrast imaging. Pancreas: No stranding about the pancreas to the extent evaluated due to streak artifact from gastroduodenal artery coiling. Pancreatic atrophy as before. Spleen: Normal. Adrenals/Urinary Tract: Adrenal glands are normal. Smooth contour the bilateral kidneys. Chronic perinephric stranding is unchanged. Venous collateral pathways about the RIGHT urinary bladder similarly unchanged. No substantial perivesical stranding, nephrolithiasis,  ureteral calculi or hydronephrosis. Stomach/Bowel: Sigmoid diverticular disease with question of mild stranding near the descending/sigmoid junction. No abscess or free air. Normal appendix. Stomach is distended without signs of adjacent stranding. Small bowel is nondilated. Ovoid low-density structure adjacent to the third portion of the duodenum measures 4.4 x 3.2 cm previously 5.1 x 3.6 cm and December of 2021 and not substantially changed compared to imaging from February of 2022. Vascular/Lymphatic: Aortic atherosclerosis. No sign of aneurysm. Smooth contour of the IVC. There is no gastrohepatic or hepatoduodenal ligament lymphadenopathy. No retroperitoneal or mesenteric lymphadenopathy. No pelvic sidewall lymphadenopathy. Limited assessment of vascular structures without intravenous contrast on today's study. Reproductive: Unremarkable aside from adjacent collateral vessels near the urinary bladder and prostate gland, not well assessed given lack of contrast. Other: Moderate LEFT small RIGHT inguinal hernias containing fat. Musculoskeletal: No acute bone finding. No destructive bone process. Spinal degenerative changes. Degenerative changes are marked and worse at the L3-4 level. IMPRESSION: Suspect mild acute uncomplicated sigmoid diverticulitis at the descending/sigmoid junction. Pancolonic diverticulosis. Hepatic cirrhosis. Small RIGHT-sided pleural effusion is diminished from more remote imaging. 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 Cholelithiasis, contracted gallbladder about gallstones. Aortic Atherosclerosis (ICD10-I70.0). Electronically Signed   By: Zetta Bills M.D.   On: 01/26/2021 17:18   DG Chest 2 View  Result Date: 12/30/2020 CLINICAL DATA:  Shortness of breath EXAM: CHEST - 2 VIEW COMPARISON:  10/17/2020 FINDINGS: Left-sided pacing device and valve prosthesis as  before. Mild cardiomegaly without overt edema. Mild diffuse reticular opacity suggesting underlying chronic disease. No consolidation or pneumothorax. IMPRESSION: No active cardiopulmonary disease.  Mild cardiomegaly. Electronically Signed   By: Donavan Foil M.D.   On: 12/30/2020 16:11   ECHOCARDIOGRAM COMPLETE  Result Date: 01/01/2021    ECHOCARDIOGRAM REPORT   Patient Name:   BRYNDON CUMBIE Date of Exam: 01/01/2021 Medical Rec #:  [id number]        Height:       72.0 in Accession #:    [id number]       Weight:       209.8 lb Date of Birth:  [birth year]        BSA:          2.174 m Patient Age:    67 years         BP:           112/66 mmHg Patient Gender: M                HR:           60 bpm. Exam Location:  Inpatient Procedure: 2D  Echo Indications:    acute systolic CHF  History:        Patient has prior history of Echocardiogram examinations, most                 recent 06/23/2020. Pacemaker, chronic kidney disease,                 Arrythmias:Atrial Fibrillation, Signs/Symptoms:Shortness of                 Breath; Risk Factors:Diabetes, Hypertension and Dyslipidemia.                 Aortic Valve: 29 mm Edwards Sapien prosthetic, stented (TAVR)                 valve is present in the aortic position.  Sonographer:    Johny Chess RDCS Referring Phys: [id number] [place]  1. Left ventricular ejection fraction, by estimation, is 30 to 35%. The left ventricle has moderately decreased function. The left ventricle demonstrates global hypokinesis. The left ventricular internal cavity size was mildly dilated. Left ventricular diastolic function could not be evaluated.  2. Right ventricular systolic function is low normal. The right ventricular size is moderately enlarged. There is mildly elevated pulmonary artery systolic pressure.  3. Left atrial size was severely dilated.  4. Right atrial size was severely dilated.  5. The mitral valve is normal in structure. Mild to moderate mitral valve  regurgitation.  6. Tricuspid valve regurgitation is moderate.  7. The aortic valve has been repaired/replaced. Aortic valve regurgitation is not visualized. No aortic stenosis is present. There is a 29 mm Edwards Sapien prosthetic (TAVR) valve present in the aortic position. Aortic valve mean gradient measures 10.0  mmHg. Aortic valve Vmax measures 2.13 m/s.  8. The inferior vena cava is dilated in size with <50% respiratory variability, suggesting right atrial pressure of 15 mmHg. Comparison(s): No significant change from prior study. Prior images reviewed side by side. FINDINGS  Left Ventricle: Left ventricular ejection fraction, by estimation, is 30 to 35%. The left ventricle has moderately decreased function. The left ventricle demonstrates global hypokinesis. The left ventricular internal cavity size was mildly dilated. There is no left ventricular hypertrophy. Abnormal (paradoxical) septal motion, consistent with RV pacemaker. Left ventricular diastolic function could not be evaluated due to atrial fibrillation. Left ventricular diastolic function could not be evaluated. Right Ventricle: The right ventricular size is moderately enlarged. No increase in right ventricular wall thickness. Right ventricular systolic function is low normal. There is mildly elevated pulmonary artery systolic pressure. The tricuspid regurgitant  velocity is 2.40 m/s, and with an assumed right atrial pressure of 15 mmHg, the estimated right ventricular systolic pressure is 83.4 mmHg. Left Atrium: Left atrial size was severely dilated. Right Atrium: Right atrial size was severely dilated. Pericardium: There is no evidence of pericardial effusion. Mitral Valve: The mitral valve is normal in structure. Mild to moderate mitral valve regurgitation, with centrally-directed jet. Tricuspid Valve: The tricuspid valve is normal in structure. Tricuspid valve regurgitation is moderate. Aortic Valve: The aortic valve has been repaired/replaced.  Aortic valve regurgitation is not visualized. No aortic stenosis is present. Aortic valve mean gradient measures 10.0 mmHg. Aortic valve peak gradient measures 18.1 mmHg. Aortic valve area, by VTI measures 1.41 cm. There is a 29 mm Edwards Sapien prosthetic, stented (TAVR) valve present in the aortic position. Pulmonic Valve: The pulmonic valve was normal in structure. Pulmonic valve regurgitation is trivial. Aorta: The aortic root and  ascending aorta are structurally normal, with no evidence of dilitation. Venous: The inferior vena cava is dilated in size with less than 50% respiratory variability, suggesting right atrial pressure of 15 mmHg. IAS/Shunts: No atrial level shunt detected by color flow Doppler. Additional Comments: A device lead is visualized in the right ventricle.  LEFT VENTRICLE PLAX 2D LVIDd:         5.80 cm      Diastology LVIDs:         4.70 cm      LV e' lateral: 11.10 cm/s LV PW:         1.30 cm LV IVS:        0.90 cm LVOT diam:     2.00 cm LV SV:         58 LV SV Index:   27 LVOT Area:     3.14 cm  LV Volumes (MOD) LV vol d, MOD A4C: 153.0 ml LV vol s, MOD A4C: 107.0 ml LV SV MOD A4C:     153.0 ml RIGHT VENTRICLE             IVC RV S prime:     14.10 cm/s  IVC diam: 2.80 cm TAPSE (M-mode): 1.6 cm LEFT ATRIUM              Index        RIGHT ATRIUM           Index LA diam:        5.30 cm  2.44 cm/m   RA Area:     27.20 cm LA Vol (A2C):   106.0 ml 48.75 ml/m  RA Volume:   88.30 ml  40.61 ml/m LA Vol (A4C):   98.1 ml  45.12 ml/m LA Biplane Vol: 103.0 ml 47.37 ml/m  AORTIC VALVE AV Area (Vmax):    1.44 cm AV Area (Vmean):   1.39 cm AV Area (VTI):     1.41 cm AV Vmax:           213.00 cm/s AV Vmean:          141.000 cm/s AV VTI:            0.412 m AV Peak Grad:      18.1 mmHg AV Mean Grad:      10.0 mmHg LVOT Vmax:         97.30 cm/s LVOT Vmean:        62.400 cm/s LVOT VTI:          0.185 m LVOT/AV VTI ratio: 0.45  AORTA Ao Asc diam: 3.30 cm TRICUSPID VALVE TR Peak grad:   23.0 mmHg TR  Vmax:        240.00 cm/s  SHUNTS Systemic VTI:  0.18 m Systemic Diam: 2.00 cm Dani Gobble Croitoru MD Electronically signed by Sanda Klein MD Signature Date/Time: 01/01/2021/2:27:58 PM    Final    CUP PACEART REMOTE DEVICE CHECK  Result Date: 01/15/2021 Scheduled remote reviewed. Normal device function.  AF burden 100%. Ave V response 60's-70's bpm. No OAC due to bleed. Next remote 91 days. LH   Orson Eva, DO  [facility]  If 7PM-7AM, please contact night-coverage [email or web address] Password [email or web address] 01/28/2021, 3:10 PM   LOS: 1 day

## 2021-01-29 ENCOUNTER — Telehealth: Payer: Self-pay | Admitting: Internal Medicine

## 2021-01-29 DIAGNOSIS — K7581 Nonalcoholic steatohepatitis (NASH): Secondary | ICD-10-CM

## 2021-01-29 LAB — TYPE AND SCREEN
ABO/RH(D): B POS
Antibody Screen: NEGATIVE
Unit division: 0
Unit division: 0

## 2021-01-29 LAB — CBC
HCT: 23.7 % — ABNORMAL LOW (ref 39.0–52.0)
Hemoglobin: 7.5 g/dL — ABNORMAL LOW (ref 13.0–17.0)
MCH: 26.9 pg (ref 26.0–34.0)
MCHC: 31.6 g/dL (ref 30.0–36.0)
MCV: 84.9 fL (ref 80.0–100.0)
Platelets: 118 10*3/uL — ABNORMAL LOW (ref 150–400)
RBC: 2.79 MIL/uL — ABNORMAL LOW (ref 4.22–5.81)
RDW: 23.2 % — ABNORMAL HIGH (ref 11.5–15.5)
WBC: 7 10*3/uL (ref 4.0–10.5)
nRBC: 0 % (ref 0.0–0.2)

## 2021-01-29 LAB — BPAM RBC
Blood Product Expiration Date: 202212042359
Blood Product Expiration Date: 202212042359
ISSUE DATE / TIME: 202211040922
ISSUE DATE / TIME: 202211041328
Unit Type and Rh: 5100
Unit Type and Rh: 5100

## 2021-01-29 LAB — BASIC METABOLIC PANEL
Anion gap: 9 (ref 5–15)
BUN: 104 mg/dL — ABNORMAL HIGH (ref 8–23)
CO2: 22 mmol/L (ref 22–32)
Calcium: 8.3 mg/dL — ABNORMAL LOW (ref 8.9–10.3)
Chloride: 102 mmol/L (ref 98–111)
Creatinine, Ser: 2.01 mg/dL — ABNORMAL HIGH (ref 0.61–1.24)
GFR, Estimated: 34 mL/min — ABNORMAL LOW (ref >60)
Glucose, Bld: 150 mg/dL — ABNORMAL HIGH (ref 70–99)
Potassium: 3.6 mmol/L (ref 3.5–5.1)
Sodium: 133 mmol/L — ABNORMAL LOW (ref 135–145)

## 2021-01-29 LAB — IRON AND TIBC
Iron: 16 ug/dL — ABNORMAL LOW (ref 45–182)
Saturation Ratios: 4 % — ABNORMAL LOW (ref 17.9–39.5)
TIBC: 435 ug/dL (ref 250–450)
UIBC: 419 ug/dL

## 2021-01-29 LAB — FERRITIN: Ferritin: 38 ng/mL (ref 24–336)

## 2021-01-29 LAB — GLUCOSE, CAPILLARY
Glucose-Capillary: 135 mg/dL — ABNORMAL HIGH (ref 70–99)
Glucose-Capillary: 206 mg/dL — ABNORMAL HIGH (ref 70–99)

## 2021-01-29 MED ORDER — PANTOPRAZOLE SODIUM 40 MG PO TBEC: 40.0000 mg | DELAYED_RELEASE_TABLET | Freq: Two times a day (BID) | ORAL | 1 refills | Status: DC

## 2021-01-29 MED ORDER — METRONIDAZOLE 500 MG PO TABS: 500.0000 mg | ORAL_TABLET | Freq: Two times a day (BID) | ORAL | Status: DC

## 2021-01-29 MED ORDER — CARVEDILOL 3.125 MG PO TABS
3.1250 mg | ORAL_TABLET | Freq: Two times a day (BID) | ORAL | Status: DC
  Administered 2021-01-29: 3.125 mg via ORAL
  Filled 2021-01-29: qty 1

## 2021-01-29 MED ORDER — PANTOPRAZOLE SODIUM 40 MG PO TBEC
40.0000 mg | DELAYED_RELEASE_TABLET | Freq: Two times a day (BID) | ORAL | Status: DC
  Administered 2021-01-29: 40 mg via ORAL
  Filled 2021-01-29: qty 1

## 2021-01-29 MED ORDER — METRONIDAZOLE 500 MG PO TABS: 500.0000 mg | ORAL_TABLET | Freq: Two times a day (BID) | ORAL | 0 refills | Status: DC

## 2021-01-29 MED ORDER — CEFDINIR 300 MG PO CAPS: 300.0000 mg | ORAL_CAPSULE | Freq: Two times a day (BID) | ORAL | 0 refills | Status: DC

## 2021-01-29 MED ORDER — FERROUS SULFATE 325 (65 FE) MG PO TABS
325.0000 mg | ORAL_TABLET | Freq: Every day | ORAL | Status: DC
  Administered 2021-01-29: 325 mg via ORAL
  Filled 2021-01-29: qty 1

## 2021-01-29 MED ORDER — FERROUS SULFATE 325 (65 FE) MG PO TABS: 325.0000 mg | ORAL_TABLET | Freq: Every day | ORAL | 3 refills | Status: DC

## 2021-01-29 MED ORDER — CEFDINIR 300 MG PO CAPS
300.0000 mg | ORAL_CAPSULE | Freq: Two times a day (BID) | ORAL | Status: DC
  Administered 2021-01-29: 300 mg via ORAL
  Filled 2021-01-29: qty 1

## 2021-01-29 NOTE — Discharge Summary (Addendum)
Physician Discharge Summary  ALVAH LAGROW KGU:[id number] DOB: May 31, 1945 DOA: 01/26/2021  PCP: [name], MD  Admit date: 01/26/2021 Discharge date: 01/29/2021  Admitted From: Home Disposition:  Home   Recommendations for Outpatient Follow-up:  Follow up with PCP in 1-2 weeks Please obtain BMP/CBC in one week   Home Health: [facility]   Discharge Condition: Stable CODE STATUS: DNR Diet recommendation: Heart Healthy / Carb Modified    Brief/Interim Summary: 75 year old male with a history of systolic and diastolic CHF, diabetes mellitus type 2, hypertension, NASH cirrhosis, CKD stage IIIb, complete heart block status post PPM, hyperlipidemia, persistent atrial fibrillation, upper GI bleed presenting with abdominal pain and associated nausea and vomiting.  The patient is a difficult historian.  Much of this history is obtained from review of medical record and speaking with the patient's spouse.  The patient has had lower abdominal pain for about 2 days.  On 02/22/2021, the patient had an episode of nausea and vomiting that was dark in nature.  He denies any hematochezia, melena, hematemesis.  In fact the patient states that he feels constipated.  His last BM was on 01/24/2021.  He denies any dysuria, hematuria, fevers, chills, chest pain, headache, neck pain, coughing, hemoptysis.  The patient has had some orthopnea type symptoms and some dyspnea on exertion.  He has gained about 5 pounds in the past week.   In the emergency department, the patient was afebrile hemodynamically stable with oxygen saturation 100% room air.  BMP showed a sodium 136, potassium 4.7, CO2 25, BUN 111, serum creatinine 1.0.  LFTs were unremarkable.  WBC 7.1, hemoglobin 7.9, platelets 129,000.  CT of the abdomen and pelvis showed sigmoid diverticular disease with mild stranding near the descending colon/sigmoid colon junction.  There is no abscess or free air.  There is a stable ovoid density anterior portion of the  duodenum.  There is chronic perinephric stranding.  The patient was started on ceftriaxone and metronidazole  Discharge Diagnoses:   Acute diverticulitis -Continue ceftriaxone and metronidazole IV -Continue clear liquid diet>>advance to soft diet -pt tolerated diet well -d/c home with cefdinir and metronidazole x 7 more days   Acute blood loss anemia -Baseline hemoglobin ~9 -likely due to "oozing" from patient's portal hypertensive gastropathy--see below -7/25 to 7/30 admit with UGI bleed -7/26 EGD--cratered duodenal ulcer with visible blood vessel status post epi injection and unsuccessful hemoclips.  The patient required IR embolization during that hospitalization. -Hemoglobin has dropped to 6.6 -Transfused 2 units PRBC>>>hgb up to 7.9 -BUN is 111 at time of admission -GI consult appreciated -11/4 EGD-- nonobstructive schatzki ring; portal HTN gastropathy; 20 mm healed ulcer in duodenal bulb without stigmata of bleeding -11/5--2 melanotic stools in past 24 hours--check h/h in am -11/6--no melena;  Hgb stable at 7.5   Acute on chronic systolic and diastolic CHF -The patient is mildly hypervolemic -10/7-10/10 admit for acute on chronic combined CHF--discharge weight 94.8 kg -01/26/2021 admission weight 97.5 kg -Spouse states that patient has gained about 5 pounds in the past 2 weeks -Start IV furosemide>>restart dose torsemide -Daily weights -11/05/2019 TEE EF 35-40%, mild decreased RV function, moderate TR/MR, global HK -01/01/2021 echo EF 30-35%, global HK, low normal RV, mod to moderate MR, moderate TR -01/29/21-clinically euvolemic--stable on RA -f/c CHF clinic 11/7   CKD stage IIIb -Baseline creatinine 1.8-2.0 -Monitor with diuresis -serum creatinine 2.01 on day of d/c   Persistent atrial fibrillation -Patient previously on anticoagulation but this has been discontinued secondary to GI bleed  and cratered duodenal ulcer with visible vessel -Rate controlled   Diabetes  mellitus type 2, uncontrolled with hyperglycemia -NovoLog sliding scale -Holding Trulicity and Tresiba -60/08/43 hemoglobin A1c 9.2   Complete heart block -Status post permanent pacemaker   Hyperlipidemia -Holding statin temporarily   S/p TAVR -04/2020   Goals of Care -DNR--confirmed with spouse and patient    Discharge Instructions   Allergies as of 01/29/2021   No Known Allergies      Medication List     STOP taking these medications    fexofenadine 180 MG tablet Commonly known as: ALLEGRA   loratadine 10 MG tablet Commonly known as: CLARITIN       TAKE these medications    Accu-Chek Aviva Plus test strip Generic drug: glucose blood TEST BLOOD SUGAR TWICE DAILY BEFORE BREAKFAST AND AT BEDTIME   acetaminophen 325 MG tablet Commonly known as: TYLENOL Take 2 tablets (650 mg total) by mouth every 4 (four) hours as needed for headache or mild pain.   allopurinol 300 MG tablet Commonly known as: ZYLOPRIM Take 0.5 tablets (150 mg total) by mouth daily. What changed:  when to take this reasons to take this   Anti-Itch lotion Generic drug: camphor-menthol Apply 1 application topically daily as needed for itching.   aspirin EC 81 MG tablet Take 1 tablet (81 mg total) by mouth daily with breakfast.   BD Pen Needle Nano U/F 32G X 4 MM Misc Generic drug: Insulin Pen Needle 1 each by Does not apply route 4 (four) times daily.   benzonatate 100 MG capsule Commonly known as: TESSALON Take 1 capsule (100 mg total) by mouth 3 (three) times daily as needed for cough.   carvedilol 3.125 MG tablet Commonly known as: COREG TAKE 1 TABLET IN THE MORNING AND 2 TABLETS IN THE EVENING   cefdinir 300 MG capsule Commonly known as: OMNICEF Take 1 capsule (300 mg total) by mouth every 12 (twelve) hours.   colchicine 0.6 MG tablet Take 1 tablet (0.6 mg total) by mouth daily as needed.   ferrous sulfate 325 (65 FE) MG tablet Take 1 tablet (325 mg total) by mouth  daily with breakfast.   hydrocortisone cream 1 % Apply 1 application topically daily.   linaclotide 145 MCG Caps capsule Commonly known as: Linzess Take 1 capsule (145 mcg total) by mouth daily before breakfast.   metolazone 2.5 MG tablet Commonly known as: ZAROXOLYN Take 1 tablet (2.5 mg total) by mouth once a week. Every Monday   metroNIDAZOLE 500 MG tablet Commonly known as: FLAGYL Take 1 tablet (500 mg total) by mouth every 12 (twelve) hours.   One-A-Day Mens 50+ Tabs Take 1 tablet by mouth daily with breakfast. Men   pantoprazole 40 MG tablet Commonly known as: PROTONIX Take 1 tablet (40 mg total) by mouth 2 (two) times daily.   potassium chloride SA 20 MEQ tablet Commonly known as: Klor-Con M20 Take 1.5 tablets (30 mEq total) by mouth 2 (two) times daily. Take 2 extra tabs on Mon with metolazone   spironolactone 25 MG tablet Commonly known as: ALDACTONE Take 0.5 tablets (12.5 mg total) by mouth daily.   sucralfate 1 g tablet Commonly known as: Carafate Take 1 tablet (1 g total) by mouth 4 (four) times daily.   torsemide 20 MG tablet Commonly known as: DEMADEX TAKE 3 TABLETS (60 MG TOTAL) BY MOUTH 2 (TWO) TIMES DAILY.   Tyler Aas FlexTouch 100 UNIT/ML FlexTouch Pen Generic drug: insulin degludec Inject 40 Units into the  skin at bedtime.   Trulicity 1.5 WJ/1.9JY Sopn Generic drug: Dulaglutide INJECT 1.5MG (1 PEN) SUBCUTANEOUSLY EVERY WEEK What changed:  how much to take how to take this when to take this additional instructions        No Known Allergies  Consultations: GI   Procedures/Studies: CT ABDOMEN PELVIS WO CONTRAST  Result Date: 01/26/2021 CLINICAL DATA:  Acute abdominal pain in the LEFT lower quadrant with nausea and vomiting that began this morning in a 75 year old male EXAM: CT ABDOMEN AND PELVIS WITHOUT CONTRAST TECHNIQUE: Multidetector CT imaging of the abdomen and pelvis was performed following the standard protocol without IV  contrast. COMPARISON:  April 30, 2020. FINDINGS: Lower chest: Signs of trans arterial aortic valve replacement seen on scout images. Pacer device in place, incompletely evaluated, lead in the RIGHT heart as before. Small RIGHT-sided pleural effusion is diminished from more remote imaging. Mild septal thickening the lung bases without dense consolidative process. Lung base assessment mildly limited due to respiratory motion. Hepatobiliary: Nodular hepatic contours compatible with cirrhotic morphologic changes. Cholelithiasis, contracted gallbladder about gallstones. No gross lesion on noncontrast imaging. Pancreas: No stranding about the pancreas to the extent evaluated due to streak artifact from gastroduodenal artery coiling. Pancreatic atrophy as before. Spleen: Normal. Adrenals/Urinary Tract: Adrenal glands are normal. Smooth contour the bilateral kidneys. Chronic perinephric stranding is unchanged. Venous collateral pathways about the RIGHT urinary bladder similarly unchanged. No substantial perivesical stranding, nephrolithiasis, ureteral calculi or hydronephrosis. Stomach/Bowel: Sigmoid diverticular disease with question of mild stranding near the descending/sigmoid junction. No abscess or free air. Normal appendix. Stomach is distended without signs of adjacent stranding. Small bowel is nondilated. Ovoid low-density structure adjacent to the third portion of the duodenum measures 4.4 x 3.2 cm previously 5.1 x 3.6 cm and December of 2021 and not substantially changed compared to imaging from February of 2022. Vascular/Lymphatic: Aortic atherosclerosis. No sign of aneurysm. Smooth contour of the IVC. There is no gastrohepatic or hepatoduodenal ligament lymphadenopathy. No retroperitoneal or mesenteric lymphadenopathy. No pelvic sidewall lymphadenopathy. Limited assessment of vascular structures without intravenous contrast on today's study. Reproductive: Unremarkable aside from adjacent collateral vessels  near the urinary bladder and prostate gland, not well assessed given lack of contrast. Other: Moderate LEFT small RIGHT inguinal hernias containing fat. Musculoskeletal: No acute bone finding. No destructive bone process. Spinal degenerative changes. Degenerative changes are marked and worse at the L3-4 level. IMPRESSION: Suspect mild acute uncomplicated sigmoid diverticulitis at the descending/sigmoid junction. Pancolonic diverticulosis. Hepatic cirrhosis. Small RIGHT-sided pleural effusion is diminished from more remote imaging. 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 Cholelithiasis, contracted gallbladder about gallstones. Aortic Atherosclerosis (ICD10-I70.0). Electronically Signed   By: Zetta Bills M.D.   On: 01/26/2021 17:18   DG Chest 2 View  Result Date: 12/30/2020 CLINICAL DATA:  Shortness of breath EXAM: CHEST - 2 VIEW COMPARISON:  10/17/2020 FINDINGS: Left-sided pacing device and valve prosthesis as before. Mild cardiomegaly without overt edema. Mild diffuse reticular opacity suggesting underlying chronic disease. No consolidation or pneumothorax. IMPRESSION: No active cardiopulmonary disease.  Mild cardiomegaly. Electronically Signed   By: Donavan Foil M.D.   On: 12/30/2020 16:11   ECHOCARDIOGRAM COMPLETE  Result Date: 01/01/2021    ECHOCARDIOGRAM REPORT   Patient Name:   John [name] Date of Exam: 01/01/2021 Medical Rec #:  [id number]        Height:       72.0  in Accession #:    [id number]       Weight:       209.8 lb Date of Birth:  [birth year]        BSA:          2.174 m Patient Age:    61 years         BP:           112/66 mmHg Patient Gender: M                HR:           60 bpm. Exam Location:  Inpatient Procedure: 2D Echo Indications:    acute systolic CHF  History:        Patient has prior history of Echocardiogram examinations, most                 recent  06/23/2020. Pacemaker, chronic kidney disease,                 Arrythmias:Atrial Fibrillation, Signs/Symptoms:Shortness of                 Breath; Risk Factors:Diabetes, Hypertension and Dyslipidemia.                 Aortic Valve: 29 mm Edwards Sapien prosthetic, stented (TAVR)                 valve is present in the aortic position.  Sonographer:    Johny Chess RDCS Referring Phys: [id number] [place]  1. Left ventricular ejection fraction, by estimation, is 30 to 35%. The left ventricle has moderately decreased function. The left ventricle demonstrates global hypokinesis. The left ventricular internal cavity size was mildly dilated. Left ventricular diastolic function could not be evaluated.  2. Right ventricular systolic function is low normal. The right ventricular size is moderately enlarged. There is mildly elevated pulmonary artery systolic pressure.  3. Left atrial size was severely dilated.  4. Right atrial size was severely dilated.  5. The mitral valve is normal in structure. Mild to moderate mitral valve regurgitation.  6. Tricuspid valve regurgitation is moderate.  7. The aortic valve has been repaired/replaced. Aortic valve regurgitation is not visualized. No aortic stenosis is present. There is a 29 mm Edwards Sapien prosthetic (TAVR) valve present in the aortic position. Aortic valve mean gradient measures 10.0  mmHg. Aortic valve Vmax measures 2.13 m/s.  8. The inferior vena cava is dilated in size with <50% respiratory variability, suggesting right atrial pressure of 15 mmHg. Comparison(s): No significant change from prior study. Prior images reviewed side by side. FINDINGS  Left Ventricle: Left ventricular ejection fraction, by estimation, is 30 to 35%. The left ventricle has moderately decreased function. The left ventricle demonstrates global hypokinesis. The left ventricular internal cavity size was mildly dilated. There is no left ventricular hypertrophy. Abnormal  (paradoxical) septal motion, consistent with RV pacemaker. Left ventricular diastolic function could not be evaluated due to atrial fibrillation. Left ventricular diastolic function could not be evaluated. Right Ventricle: The right ventricular size is moderately enlarged. No increase in right ventricular wall thickness. Right ventricular systolic function is low normal. There is mildly elevated pulmonary artery systolic pressure. The tricuspid regurgitant  velocity is 2.40 m/s, and with an assumed right atrial pressure of 15 mmHg, the estimated right ventricular systolic pressure is 98.9 mmHg. Left Atrium: Left atrial size was severely dilated. Right Atrium: Right atrial size was severely dilated. Pericardium: There is no evidence  of pericardial effusion. Mitral Valve: The mitral valve is normal in structure. Mild to moderate mitral valve regurgitation, with centrally-directed jet. Tricuspid Valve: The tricuspid valve is normal in structure. Tricuspid valve regurgitation is moderate. Aortic Valve: The aortic valve has been repaired/replaced. Aortic valve regurgitation is not visualized. No aortic stenosis is present. Aortic valve mean gradient measures 10.0 mmHg. Aortic valve peak gradient measures 18.1 mmHg. Aortic valve area, by VTI measures 1.41 cm. There is a 29 mm Edwards Sapien prosthetic, stented (TAVR) valve present in the aortic position. Pulmonic Valve: The pulmonic valve was normal in structure. Pulmonic valve regurgitation is trivial. Aorta: The aortic root and ascending aorta are structurally normal, with no evidence of dilitation. Venous: The inferior vena cava is dilated in size with less than 50% respiratory variability, suggesting right atrial pressure of 15 mmHg. IAS/Shunts: No atrial level shunt detected by color flow Doppler. Additional Comments: A device lead is visualized in the right ventricle.  LEFT VENTRICLE PLAX 2D LVIDd:         5.80 cm      Diastology LVIDs:         4.70 cm      LV e'  lateral: 11.10 cm/s LV PW:         1.30 cm LV IVS:        0.90 cm LVOT diam:     2.00 cm LV SV:         58 LV SV Index:   27 LVOT Area:     3.14 cm  LV Volumes (MOD) LV vol d, MOD A4C: 153.0 ml LV vol s, MOD A4C: 107.0 ml LV SV MOD A4C:     153.0 ml RIGHT VENTRICLE             IVC RV S prime:     14.10 cm/s  IVC diam: 2.80 cm TAPSE (M-mode): 1.6 cm LEFT ATRIUM              Index        RIGHT ATRIUM           Index LA diam:        5.30 cm  2.44 cm/m   RA Area:     27.20 cm LA Vol (A2C):   106.0 ml 48.75 ml/m  RA Volume:   88.30 ml  40.61 ml/m LA Vol (A4C):   98.1 ml  45.12 ml/m LA Biplane Vol: 103.0 ml 47.37 ml/m  AORTIC VALVE AV Area (Vmax):    1.44 cm AV Area (Vmean):   1.39 cm AV Area (VTI):     1.41 cm AV Vmax:           213.00 cm/s AV Vmean:          141.000 cm/s AV VTI:            0.412 m AV Peak Grad:      18.1 mmHg AV Mean Grad:      10.0 mmHg LVOT Vmax:         97.30 cm/s LVOT Vmean:        62.400 cm/s LVOT VTI:          0.185 m LVOT/AV VTI ratio: 0.45  AORTA Ao Asc diam: 3.30 cm TRICUSPID VALVE TR Peak grad:   23.0 mmHg TR Vmax:        240.00 cm/s  SHUNTS Systemic VTI:  0.18 m Systemic Diam: 2.00 cm Dani Gobble Croitoru MD Electronically signed by Sanda Klein MD Signature Date/Time: 01/01/2021/2:27:58  PM    Final    CUP PACEART REMOTE DEVICE CHECK  Result Date: 01/15/2021 Scheduled remote reviewed. Normal device function.  AF burden 100%. Ave V response 60's-70's bpm. No OAC due to bleed. Next remote 91 days. Melvina       Discharge Exam: Vitals:   01/29/21 0451 01/29/21 0910  BP: (!) 125/52 111/64  Pulse: 63 60  Resp: 19   Temp: 97.9 F (36.6 C)   SpO2: 99%    Vitals:   01/28/21 1348 01/28/21 2124 01/29/21 0451 01/29/21 0910  BP: (!) 142/108 (!) 119/53 (!) 125/52 111/64  Pulse: 74 60 63 60  Resp: 15 19 19    Temp: 99.2 F (37.3 C) 97.7 F (36.5 C) 97.9 F (36.6 C)   TempSrc:  Oral Oral   SpO2: 100% 100% 99%   Weight:      Height:        General: Pt is alert, awake, not  in acute distress Cardiovascular: RRR, S1/S2 +, no rubs, no gallops Respiratory: CTA bilaterally, no wheezing, no rhonchi Abdominal: Soft, NT, ND, bowel sounds + Extremities: no edema, no cyanosis   The results of significant diagnostics from this hospitalization (including imaging, microbiology, ancillary and laboratory) are listed below for reference.    Significant Diagnostic Studies: CT ABDOMEN PELVIS WO CONTRAST  Result Date: 01/26/2021 CLINICAL DATA:  Acute abdominal pain in the LEFT lower quadrant with nausea and vomiting that began this morning in a 75 year old male EXAM: CT ABDOMEN AND PELVIS WITHOUT CONTRAST TECHNIQUE: Multidetector CT imaging of the abdomen and pelvis was performed following the standard protocol without IV contrast. COMPARISON:  April 30, 2020. FINDINGS: Lower chest: Signs of trans arterial aortic valve replacement seen on scout images. Pacer device in place, incompletely evaluated, lead in the RIGHT heart as before. Small RIGHT-sided pleural effusion is diminished from more remote imaging. Mild septal thickening the lung bases without dense consolidative process. Lung base assessment mildly limited due to respiratory motion. Hepatobiliary: Nodular hepatic contours compatible with cirrhotic morphologic changes. Cholelithiasis, contracted gallbladder about gallstones. No gross lesion on noncontrast imaging. Pancreas: No stranding about the pancreas to the extent evaluated due to streak artifact from gastroduodenal artery coiling. Pancreatic atrophy as before. Spleen: Normal. Adrenals/Urinary Tract: Adrenal glands are normal. Smooth contour the bilateral kidneys. Chronic perinephric stranding is unchanged. Venous collateral pathways about the RIGHT urinary bladder similarly unchanged. No substantial perivesical stranding, nephrolithiasis, ureteral calculi or hydronephrosis. Stomach/Bowel: Sigmoid diverticular disease with question of mild stranding near the descending/sigmoid  junction. No abscess or free air. Normal appendix. Stomach is distended without signs of adjacent stranding. Small bowel is nondilated. Ovoid low-density structure adjacent to the third portion of the duodenum measures 4.4 x 3.2 cm previously 5.1 x 3.6 cm and December of 2021 and not substantially changed compared to imaging from February of 2022. Vascular/Lymphatic: Aortic atherosclerosis. No sign of aneurysm. Smooth contour of the IVC. There is no gastrohepatic or hepatoduodenal ligament lymphadenopathy. No retroperitoneal or mesenteric lymphadenopathy. No pelvic sidewall lymphadenopathy. Limited assessment of vascular structures without intravenous contrast on today's study. Reproductive: Unremarkable aside from adjacent collateral vessels near the urinary bladder and prostate gland, not well assessed given lack of contrast. Other: Moderate LEFT small RIGHT inguinal hernias containing fat. Musculoskeletal: No acute bone finding. No destructive bone process. Spinal degenerative changes. Degenerative changes are marked and worse at the L3-4 level. IMPRESSION: Suspect mild acute uncomplicated sigmoid diverticulitis at the descending/sigmoid junction. Pancolonic diverticulosis. Hepatic cirrhosis. Small RIGHT-sided pleural effusion is diminished  from more remote imaging. 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 Cholelithiasis, contracted gallbladder about gallstones. Aortic Atherosclerosis (ICD10-I70.0). Electronically Signed   By: Zetta Bills M.D.   On: 01/26/2021 17:18   DG Chest 2 View  Result Date: 12/30/2020 CLINICAL DATA:  Shortness of breath EXAM: CHEST - 2 VIEW COMPARISON:  10/17/2020 FINDINGS: Left-sided pacing device and valve prosthesis as before. Mild cardiomegaly without overt edema. Mild diffuse reticular opacity suggesting underlying chronic disease. No consolidation or  pneumothorax. IMPRESSION: No active cardiopulmonary disease.  Mild cardiomegaly. Electronically Signed   By: Donavan Foil M.D.   On: 12/30/2020 16:11   ECHOCARDIOGRAM COMPLETE  Result Date: 01/01/2021    ECHOCARDIOGRAM REPORT   Patient Name:   John [name] Date of Exam: 01/01/2021 Medical Rec #:  [id number]        Height:       72.0 in Accession #:    [id number]       Weight:       209.8 lb Date of Birth:  Feb 03, 1946        BSA:          2.174 m Patient Age:    89 years         BP:           112/66 mmHg Patient Gender: M                HR:           60 bpm. Exam Location:  Inpatient Procedure: 2D Echo Indications:    acute systolic CHF  History:        Patient has prior history of Echocardiogram examinations, most                 recent 06/23/2020. Pacemaker, chronic kidney disease,                 Arrythmias:Atrial Fibrillation, Signs/Symptoms:Shortness of                 Breath; Risk Factors:Diabetes, Hypertension and Dyslipidemia.                 Aortic Valve: 29 mm Edwards Sapien prosthetic, stented (TAVR)                 valve is present in the aortic position.  Sonographer:    Johny Chess RDCS Referring Phys: [id number] [place]  1. Left ventricular ejection fraction, by estimation, is 30 to 35%. The left ventricle has moderately decreased function. The left ventricle demonstrates global hypokinesis. The left ventricular internal cavity size was mildly dilated. Left ventricular diastolic function could not be evaluated.  2. Right ventricular systolic function is low normal. The right ventricular size is moderately enlarged. There is mildly elevated pulmonary artery systolic pressure.  3. Left atrial size was severely dilated.  4. Right atrial size was severely dilated.  5. The mitral valve is normal in structure. Mild to moderate mitral valve regurgitation.  6. Tricuspid valve regurgitation is moderate.  7. The aortic valve has been repaired/replaced. Aortic valve regurgitation is  not visualized. No aortic stenosis is present. There is a 29 mm Edwards Sapien prosthetic (TAVR) valve present in the aortic position. Aortic valve mean gradient measures 10.0  mmHg. Aortic valve Vmax measures 2.13 m/s.  8. The inferior vena cava is dilated in size with <50% respiratory variability, suggesting  right atrial pressure of 15 mmHg. Comparison(s): No significant change from prior study. Prior images reviewed side by side. FINDINGS  Left Ventricle: Left ventricular ejection fraction, by estimation, is 30 to 35%. The left ventricle has moderately decreased function. The left ventricle demonstrates global hypokinesis. The left ventricular internal cavity size was mildly dilated. There is no left ventricular hypertrophy. Abnormal (paradoxical) septal motion, consistent with RV pacemaker. Left ventricular diastolic function could not be evaluated due to atrial fibrillation. Left ventricular diastolic function could not be evaluated. Right Ventricle: The right ventricular size is moderately enlarged. No increase in right ventricular wall thickness. Right ventricular systolic function is low normal. There is mildly elevated pulmonary artery systolic pressure. The tricuspid regurgitant  velocity is 2.40 m/s, and with an assumed right atrial pressure of 15 mmHg, the estimated right ventricular systolic pressure is 10.1 mmHg. Left Atrium: Left atrial size was severely dilated. Right Atrium: Right atrial size was severely dilated. Pericardium: There is no evidence of pericardial effusion. Mitral Valve: The mitral valve is normal in structure. Mild to moderate mitral valve regurgitation, with centrally-directed jet. Tricuspid Valve: The tricuspid valve is normal in structure. Tricuspid valve regurgitation is moderate. Aortic Valve: The aortic valve has been repaired/replaced. Aortic valve regurgitation is not visualized. No aortic stenosis is present. Aortic valve mean gradient measures 10.0 mmHg. Aortic valve peak  gradient measures 18.1 mmHg. Aortic valve area, by VTI measures 1.41 cm. There is a 29 mm Edwards Sapien prosthetic, stented (TAVR) valve present in the aortic position. Pulmonic Valve: The pulmonic valve was normal in structure. Pulmonic valve regurgitation is trivial. Aorta: The aortic root and ascending aorta are structurally normal, with no evidence of dilitation. Venous: The inferior vena cava is dilated in size with less than 50% respiratory variability, suggesting right atrial pressure of 15 mmHg. IAS/Shunts: No atrial level shunt detected by color flow Doppler. Additional Comments: A device lead is visualized in the right ventricle.  LEFT VENTRICLE PLAX 2D LVIDd:         5.80 cm      Diastology LVIDs:         4.70 cm      LV e' lateral: 11.10 cm/s LV PW:         1.30 cm LV IVS:        0.90 cm LVOT diam:     2.00 cm LV SV:         58 LV SV Index:   27 LVOT Area:     3.14 cm  LV Volumes (MOD) LV vol d, MOD A4C: 153.0 ml LV vol s, MOD A4C: 107.0 ml LV SV MOD A4C:     153.0 ml RIGHT VENTRICLE             IVC RV S prime:     14.10 cm/s  IVC diam: 2.80 cm TAPSE (M-mode): 1.6 cm LEFT ATRIUM              Index        RIGHT ATRIUM           Index LA diam:        5.30 cm  2.44 cm/m   RA Area:     27.20 cm LA Vol (A2C):   106.0 ml 48.75 ml/m  RA Volume:   88.30 ml  40.61 ml/m LA Vol (A4C):   98.1 ml  45.12 ml/m LA Biplane Vol: 103.0 ml 47.37 ml/m  AORTIC VALVE AV Area (Vmax):    1.44 cm  AV Area (Vmean):   1.39 cm AV Area (VTI):     1.41 cm AV Vmax:           213.00 cm/s AV Vmean:          141.000 cm/s AV VTI:            0.412 m AV Peak Grad:      18.1 mmHg AV Mean Grad:      10.0 mmHg LVOT Vmax:         97.30 cm/s LVOT Vmean:        62.400 cm/s LVOT VTI:          0.185 m LVOT/AV VTI ratio: 0.45  AORTA Ao Asc diam: 3.30 cm TRICUSPID VALVE TR Peak grad:   23.0 mmHg TR Vmax:        240.00 cm/s  SHUNTS Systemic VTI:  0.18 m Systemic Diam: 2.00 cm Dani Gobble Croitoru MD Electronically signed by Sanda Klein MD  Signature Date/Time: 01/01/2021/2:27:58 PM    Final    CUP PACEART REMOTE DEVICE CHECK  Result Date: 01/15/2021 Scheduled remote reviewed. Normal device function.  AF burden 100%. Ave V response 60's-70's bpm. No OAC due to bleed. Next remote 91 days. [place]   Microbiology: Recent Results (from the past 240 hour(s))  Resp Panel by RT-PCR (Flu A&B, Covid) Nasopharyngeal Swab     Status: None   Collection Time: 01/26/21  6:45 PM   Specimen: Nasopharyngeal Swab; Nasopharyngeal(NP) swabs in vial transport medium  Result Value Ref Range Status   SARS Coronavirus 2 by RT PCR NEGATIVE NEGATIVE Final    Comment: (NOTE) SARS-CoV-2 target nucleic acids are NOT DETECTED.  The SARS-CoV-2 RNA is generally detectable in upper respiratory specimens during the acute phase of infection. The lowest concentration of SARS-CoV-2 viral copies this assay can detect is 138 copies/mL. A negative result does not preclude SARS-Cov-2 infection and should not be used as the sole basis for treatment or other patient management decisions. A negative result may occur with  improper specimen collection/handling, submission of specimen other than nasopharyngeal swab, presence of viral mutation(s) within the areas targeted by this assay, and inadequate number of viral copies(<138 copies/mL). A negative result must be combined with clinical observations, patient history, and epidemiological information. The expected result is Negative.  Fact Sheet for Patients:  EntrepreneurPulse.com.au  Fact Sheet for Healthcare Providers:  IncredibleEmployment.be  This test is no t yet approved or cleared by the [place] FDA and  has been authorized for detection and/or diagnosis of SARS-CoV-2 by FDA under an Emergency Use Authorization (EUA). This EUA will remain  in effect (meaning this test can be used) for the duration of the COVID-19 declaration under Section 564(b)(1) of the Act,  21 U.S.C.section 360bbb-3(b)(1), unless the authorization is terminated  or revoked sooner.       Influenza A by PCR NEGATIVE NEGATIVE Final   Influenza B by PCR NEGATIVE NEGATIVE Final    Comment: (NOTE) The Xpert Xpress SARS-CoV-2/FLU/RSV plus assay is intended as an aid in the diagnosis of influenza from Nasopharyngeal swab specimens and should not be used as a sole basis for treatment. Nasal washings and aspirates are unacceptable for Xpert Xpress SARS-CoV-2/FLU/RSV testing.  Fact Sheet for Patients: EntrepreneurPulse.com.au  Fact Sheet for Healthcare Providers: IncredibleEmployment.be  This test is not yet approved or cleared by the [place] FDA and has been authorized for detection and/or diagnosis of SARS-CoV-2 by FDA under an Emergency Use Authorization (EUA). This EUA will remain  in effect (meaning this test can be used) for the duration of the COVID-19 declaration under Section 564(b)(1) of the Act, 21 U.S.C. section 360bbb-3(b)(1), unless the authorization is terminated or revoked.  Performed at [facility], [address] [place], [place] [address]      Labs: Basic Metabolic Panel: Recent Labs  Lab 01/26/21 1305 01/27/21 0529 01/28/21 0501 01/29/21 0421  NA 136 135 132* 133*  K 4.7 4.4 4.0 3.6  CL 102 102 100 102  CO2 25 24 22 22   GLUCOSE 213* 161* 144* 150*  BUN 111* 123* 131* 104*  CREATININE 1.80* 1.88* 2.12* 2.01*  CALCIUM 8.4* 8.2* 8.2* 8.3*  MG  --  2.5* 2.5*  --    Liver Function Tests: Recent Labs  Lab 01/26/21 1305  AST 35  ALT 22  ALKPHOS 251*  BILITOT 2.0*  PROT 6.7  ALBUMIN 2.8*   Recent Labs  Lab 01/26/21 1305  LIPASE 39   No results for input(s): AMMONIA in the last 168 hours. CBC: Recent Labs  Lab 01/26/21 1305 01/27/21 0529 01/27/21 0655 01/28/21 0501 01/29/21 0421  WBC 7.1 8.8  --  9.1 7.0  NEUTROABS 4.8  --   --   --   --   HGB 7.9* 6.6* 6.5* 7.8* 7.5*  HCT 24.6* 20.5*  20.0* 23.8* 23.7*  MCV 82.3 82.7  --  83.2 84.9  PLT 129* 133*  --  145* 118*   Cardiac Enzymes: No results for input(s): CKTOTAL, CKMB, CKMBINDEX, TROPONINI in the last 168 hours. BNP: Invalid input(s): POCBNP CBG: Recent Labs  Lab 01/28/21 0726 01/28/21 1050 01/28/21 1601 01/28/21 2120 01/29/21 0727  GLUCAP 121* 151* 173* 191* 135*    Time coordinating discharge:  36 minutes  Signed:  Orson Eva, DO [facility] Pager: [phone] 01/29/2021, 9:19 AM

## 2021-01-29 NOTE — TOC Transition Note (Signed)
Transition of Care [facility]) - CM/SW Discharge Note   Patient Details  Name: SACRAMENTO MONDS MRN: [id number] Date of Birth: 07/25/1945  Transition of Care [facility]) CM/SW Contact:  Natasha Bence, LCSW Phone Number: 01/29/2021, 10:57 AM   Clinical Narrative:    CSW notified of patient's readiness for discharge. Patient agreeable to [facility] referral. CSW referred patient to [place] with [name]. Georgina Snell agreeable to provide [facility] services. Toc signing off.    Final next level of care: [place] Barriers to Discharge: Barriers Resolved   Patient Goals and CMS Choice Patient states their goals for this hospitalization and ongoing recovery are:: Return home with [place] CMS Medicare.gov Compare Post Acute Care list provided to:: Patient    Discharge Placement                    Patient and family notified of of transfer: 01/29/21  Discharge Plan and Services                          HH Arranged: RN, PT [facility] Agency: [place]        Social Determinants of Health (SDOH) Interventions     Readmission Risk Interventions Readmission Risk Prevention Plan 10/18/2020 11/14/2018  Transportation Screening Complete Complete  PCP or Specialist Appt within 3-5 Days - Complete  HRI or [place] - Complete  Social Work Consult for [place] Planning/Counseling - Complete  Palliative Care Screening - Not Applicable  Medication Review Press photographer) Complete Complete  HRI or Home Care Consult Complete -  SW Recovery Care/Counseling Consult Complete -  Palliative Care Screening Not Applicable -  [place] Not Applicable -  Some recent data might be hidden

## 2021-01-29 NOTE — Telephone Encounter (Signed)
Barbie Banner, can you please arrange hospital follow-up visit with Dr. Jenetta Downer or [facility] for this patient?  Thank you

## 2021-01-29 NOTE — Progress Notes (Signed)
Discharge instructions read to patient and spouse. They verbalized understanding of all information

## 2021-01-29 NOTE — Progress Notes (Addendum)
Subjective: Patient states he feels well today.  Notes much improvement since day of presentation.  Has had a few small bowel movements consistent with melena.  Hemoglobin stable.  Objective: Vital signs in last 24 hours: Temp:  [97.7 F (36.5 C)-99.2 F (37.3 C)] 97.9 F (36.6 C) (11/06 0451) Pulse Rate:  [60-74] 60 (11/06 0910) Resp:  [15-19] 19 (11/06 0451) BP: (111-142)/(52-108) 111/64 (11/06 0910) SpO2:  [99 %-100 %] 99 % (11/06 0451) Last BM Date: 01/26/21 General:   Alert and oriented, pleasant Head:  Normocephalic and atraumatic. Eyes:  No icterus, sclera clear. Conjuctiva pink.  Abdomen:  Bowel sounds present, soft, non-tender, non-distended. No HSM or hernias noted. No rebound or guarding. No masses appreciated  Msk:  Symmetrical without gross deformities. Normal posture. Extremities:  Without clubbing or edema. Neurologic:  Alert and  oriented x4;  grossly normal neurologically. Skin:  Warm and dry, intact without significant lesions.  Cervical Nodes:  No significant cervical adenopathy. Psych:  Alert and cooperative. Normal mood and affect.  Intake/Output from previous day: 11/05 0701 - 11/06 0700 In: 540 [P.O.:240; IV Piggyback:300] Out: 1200 [Urine:1200] Intake/Output this shift: No intake/output data recorded.  Lab Results: Recent Labs    01/27/21 0529 01/27/21 0655 01/28/21 0501 01/29/21 0421  WBC 8.8  --  9.1 7.0  HGB 6.6* 6.5* 7.8* 7.5*  HCT 20.5* 20.0* 23.8* 23.7*  PLT 133*  --  145* 118*   BMET Recent Labs    01/27/21 0529 01/28/21 0501 01/29/21 0421  NA 135 132* 133*  K 4.4 4.0 3.6  CL 102 100 102  CO2 24 22 22   GLUCOSE 161* 144* 150*  BUN 123* 131* 104*  CREATININE 1.88* 2.12* 2.01*  CALCIUM 8.2* 8.2* 8.3*   LFT Recent Labs    01/26/21 1305  PROT 6.7  ALBUMIN 2.8*  AST 35  ALT 22  ALKPHOS 251*  BILITOT 2.0*   PT/INR Recent Labs    01/27/21 0928  LABPROT 17.6*  INR 1.5*   Hepatitis Panel No results for input(s):  HEPBSAG, HCVAB, HEPAIGM, HEPBIGM in the last 72 hours.   Studies/Results: No results found.  Assessment: *Melena *Acute blood loss anemia *Cirrhosis *Diverticulitis  Plan: EGD 01/27/2021 showed previously noted duodenal ulcer has healed nicely.  Patient with evidence of portal hypertensive gastropathy which may have oozed leading to his melena and anemia.  Hemoglobin stable.  Patient states he feels great today.  Iron studies pending, patient would likely benefit from p.o. iron supplementation upon discharge.  Will need follow-up with GI for cirrhosis care which I will arrange.  Can consider capsule endoscopy for completeness in regards to his anemia work-up.  Continue on p.o. PPI daily upon discharge.  Avoid NSAIDs.  Repeat CBC in 1 week.  Complete 7-day course of antibiotics for diverticulitis.  Consider updating colonoscopy in 6 to 8 weeks.  GI to sign off, please call with questions concerns.  John [name]. Abbey Chatters, D.O. Gastroenterology and Hepatology [facility]   LOS: 2 days    01/29/2021, 9:26 AM

## 2021-01-30 ENCOUNTER — Other Ambulatory Visit: Payer: Self-pay

## 2021-01-30 ENCOUNTER — Encounter (HOSPITAL_COMMUNITY): Payer: Self-pay

## 2021-01-30 ENCOUNTER — Ambulatory Visit (HOSPITAL_COMMUNITY)
Admission: RE | Admit: 2021-01-30 | Discharge: 2021-01-30 | Disposition: A | Discharge status: Home / Self Care | Payer: Medicare HMO | Source: Ambulatory Visit | Attending: Family Medicine | Admitting: Family Medicine

## 2021-01-30 VITALS — BP 102/46 | HR 88 | Wt 218.4 lb

## 2021-01-30 DIAGNOSIS — Z66 Do not resuscitate: Secondary | ICD-10-CM | POA: Diagnosis not present

## 2021-01-30 DIAGNOSIS — I4892 Unspecified atrial flutter: Secondary | ICD-10-CM | POA: Diagnosis not present

## 2021-01-30 DIAGNOSIS — E1122 Type 2 diabetes mellitus with diabetic chronic kidney disease: Secondary | ICD-10-CM | POA: Diagnosis not present

## 2021-01-30 DIAGNOSIS — I080 Rheumatic disorders of both mitral and aortic valves: Secondary | ICD-10-CM | POA: Diagnosis not present

## 2021-01-30 DIAGNOSIS — Z7901 Long term (current) use of anticoagulants: Secondary | ICD-10-CM | POA: Diagnosis not present

## 2021-01-30 DIAGNOSIS — I5022 Chronic systolic (congestive) heart failure: Secondary | ICD-10-CM

## 2021-01-30 DIAGNOSIS — R5383 Other fatigue: Secondary | ICD-10-CM | POA: Diagnosis present

## 2021-01-30 DIAGNOSIS — E1159 Type 2 diabetes mellitus with other circulatory complications: Secondary | ICD-10-CM

## 2021-01-30 DIAGNOSIS — I4819 Other persistent atrial fibrillation: Secondary | ICD-10-CM

## 2021-01-30 DIAGNOSIS — Z8719 Personal history of other diseases of the digestive system: Secondary | ICD-10-CM

## 2021-01-30 DIAGNOSIS — Z8679 Personal history of other diseases of the circulatory system: Secondary | ICD-10-CM

## 2021-01-30 DIAGNOSIS — E119 Type 2 diabetes mellitus without complications: Secondary | ICD-10-CM | POA: Diagnosis not present

## 2021-01-30 DIAGNOSIS — Z79899 Other long term (current) drug therapy: Secondary | ICD-10-CM | POA: Diagnosis not present

## 2021-01-30 DIAGNOSIS — I428 Other cardiomyopathies: Secondary | ICD-10-CM | POA: Insufficient documentation

## 2021-01-30 DIAGNOSIS — Z87891 Personal history of nicotine dependence: Secondary | ICD-10-CM | POA: Insufficient documentation

## 2021-01-30 DIAGNOSIS — R531 Weakness: Secondary | ICD-10-CM | POA: Insufficient documentation

## 2021-01-30 DIAGNOSIS — N1832 Chronic kidney disease, stage 3b: Secondary | ICD-10-CM | POA: Diagnosis not present

## 2021-01-30 DIAGNOSIS — I5043 Acute on chronic combined systolic (congestive) and diastolic (congestive) heart failure: Secondary | ICD-10-CM | POA: Diagnosis not present

## 2021-01-30 DIAGNOSIS — I482 Chronic atrial fibrillation, unspecified: Secondary | ICD-10-CM | POA: Insufficient documentation

## 2021-01-30 DIAGNOSIS — Z794 Long term (current) use of insulin: Secondary | ICD-10-CM

## 2021-01-30 DIAGNOSIS — I130 Hypertensive heart and chronic kidney disease with heart failure and stage 1 through stage 4 chronic kidney disease, or unspecified chronic kidney disease: Secondary | ICD-10-CM | POA: Insufficient documentation

## 2021-01-30 DIAGNOSIS — Z952 Presence of prosthetic heart valve: Secondary | ICD-10-CM | POA: Diagnosis not present

## 2021-01-30 DIAGNOSIS — Z09 Encounter for follow-up examination after completed treatment for conditions other than malignant neoplasm: Secondary | ICD-10-CM | POA: Diagnosis not present

## 2021-01-30 DIAGNOSIS — I340 Nonrheumatic mitral (valve) insufficiency: Secondary | ICD-10-CM | POA: Diagnosis not present

## 2021-01-30 LAB — BASIC METABOLIC PANEL
Anion gap: 9 (ref 5–15)
BUN: 78 mg/dL — ABNORMAL HIGH (ref 8–23)
CO2: 22 mmol/L (ref 22–32)
Calcium: 8.3 mg/dL — ABNORMAL LOW (ref 8.9–10.3)
Chloride: 104 mmol/L (ref 98–111)
Creatinine, Ser: 1.93 mg/dL — ABNORMAL HIGH (ref 0.61–1.24)
GFR, Estimated: 36 mL/min — ABNORMAL LOW (ref >60)
Glucose, Bld: 165 mg/dL — ABNORMAL HIGH (ref 70–99)
Potassium: 4.1 mmol/L (ref 3.5–5.1)
Sodium: 135 mmol/L (ref 135–145)

## 2021-01-30 LAB — CBC
HCT: 26.2 % — ABNORMAL LOW (ref 39.0–52.0)
Hemoglobin: 8 g/dL — ABNORMAL LOW (ref 13.0–17.0)
MCH: 26.6 pg (ref 26.0–34.0)
MCHC: 30.5 g/dL (ref 30.0–36.0)
MCV: 87 fL (ref 80.0–100.0)
Platelets: 114 10*3/uL — ABNORMAL LOW (ref 150–400)
RBC: 3.01 MIL/uL — ABNORMAL LOW (ref 4.22–5.81)
RDW: 23.7 % — ABNORMAL HIGH (ref 11.5–15.5)
WBC: 6.1 10*3/uL (ref 4.0–10.5)
nRBC: 0 % (ref 0.0–0.2)

## 2021-01-30 MED ORDER — METOLAZONE 2.5 MG PO TABS: 2.5000 mg | ORAL_TABLET | ORAL | 4 refills | Status: DC

## 2021-01-30 MED ORDER — CARVEDILOL 3.125 MG PO TABS: 3.1250 mg | ORAL_TABLET | Freq: Two times a day (BID) | ORAL | 4 refills | Status: DC

## 2021-01-30 NOTE — Patient Instructions (Addendum)
EKG was done today  Labs were done today, if any labs are abnormal the clinic will call you  DECREASE Carvedilol to 3.125 mg 1 tablet 2 times daily  TAKE Metolazone 2.5 mg 1 tablet with 40 meq of potassium 2 tablets today 01/30/2021 and tomorrow 01/31/2021 then every Monday starting 02/06/2021  You have been referred to the electro physiology clinic they will call you to make a appointment  Your physician recommends that you return for lab work in: 1 week  Your physician recommends that you schedule a follow-up appointment in: 2-3 weeks in clinic and in 2-3 months with Dr. Haroldine Laws  At the [facility], you and your health needs are our priority. As part of our continuing mission to provide you with exceptional heart care, we have created designated Provider Care Teams. These Care Teams include your primary Cardiologist (physician) and Advanced Practice Providers (APPs- Physician Assistants and Nurse Practitioners) who all work together to provide you with the care you need, when you need it.   You may see any of the following providers on your designated Care Team at your next follow up: Dr Glori Bickers Dr Haynes Kerns, NP Lyda Jester, [place] [facility] [place], [place] Audry Riles, PharmD   Please be sure to bring in all your medications bottles to every appointment.   If you have any questions or concerns before your next appointment please send [place] a message through [place] or call our office at [phone].    TO LEAVE A MESSAGE FOR THE NURSE SELECT OPTION 2, PLEASE LEAVE A MESSAGE INCLUDING: YOUR NAME DATE OF BIRTH CALL BACK NUMBER REASON FOR CALL**this is important as we prioritize the call backs  YOU WILL RECEIVE A CALL BACK THE SAME DAY AS LONG AS YOU CALL BEFORE 4:00 PM  Diverticulitis Diverticulitis is infection or inflammation of small pouches (diverticula) in the colon that form due to a condition called diverticulosis.  Diverticula can trap stool (feces) and bacteria, causing infection and inflammation. Diverticulitis may cause severe stomach pain and diarrhea. It may lead to tissue damage in the colon that causes bleeding or blockage. The diverticula may also burst (rupture) and cause infected stool to enter other areas of the abdomen. What are the causes? This condition is caused by stool becoming trapped in the diverticula, which allows bacteria to grow in the diverticula. This leads to inflammation and infection. What increases the risk? You are more likely to develop this condition if you have diverticulosis. The risk increases if you: Are overweight or obese. Do not get enough exercise. Drink alcohol. Use tobacco products. Eat a diet that has a lot of red meat such as beef, pork, or lamb. Eat a diet that does not include enough fiber. High-fiber foods include fruits, vegetables, beans, nuts, and whole grains. Are over 26 years of age. What are the signs or symptoms? Symptoms of this condition may include: Pain and tenderness in the abdomen. The pain is normally located on the left side of the abdomen, but it may occur in other areas. Fever and chills. Nausea. Vomiting. Cramping. Bloating. Changes in bowel routines. Blood in your stool. How is this diagnosed? This condition is diagnosed based on: Your medical history. A physical exam. Tests to make sure there is nothing else causing your condition. These tests may include: Blood tests. Urine tests. CT scan of the abdomen. How is this treated? Most cases of this condition are mild and can be treated at home. Treatment  may include: Taking over-the-counter pain medicines. Following a clear liquid diet. Taking antibiotic medicines by mouth. Resting. More severe cases may need to be treated at a hospital. Treatment may include: Not eating or drinking. Taking prescription pain medicine. Receiving antibiotic medicines through an IV. Receiving  fluids and nutrition through an IV. Surgery. When your condition is under control, your health care provider may recommend that you have a colonoscopy. This is an exam to look at the entire large intestine. During the exam, a lubricated, bendable tube is inserted into the anus and then passed into the rectum, colon, and other parts of the large intestine. A colonoscopy can show how severe your diverticula are and whether something else may be causing your symptoms. Follow these instructions at home: Medicines Take over-the-counter and prescription medicines only as told by your health care provider. These include fiber supplements, probiotics, and stool softeners. If you were prescribed an antibiotic medicine, take it as told by your health care provider. Do not stop taking the antibiotic even if you start to feel better. Ask your health care provider if the medicine prescribed to you requires you to avoid driving or using machinery. Eating and drinking  Follow a full liquid diet or another diet as directed by your health care provider. After your symptoms improve, your health care provider may tell you to change your diet. He or she may recommend that you eat a diet that contains at least 25 grams (25 g) of fiber daily. Fiber makes it easier to pass stool. Healthy sources of fiber include: Berries. One cup contains 4-8 grams of fiber. Beans or lentils. One-half cup contains 5-8 grams of fiber. Green vegetables. One cup contains 4 grams of fiber. Avoid eating red meat. General instructions Do not use any products that contain nicotine or tobacco, such as cigarettes, e-cigarettes, and chewing tobacco. If you need help quitting, ask your health care provider. Exercise for at least 30 minutes, 3 times each week. You should exercise hard enough to raise your heart rate and break a sweat. Keep all follow-up visits as told by your health care provider. This is important. You may need to have a  colonoscopy. Contact a health care provider if: Your pain does not improve. Your bowel movements do not return to normal. Get help right away if: Your pain gets worse. Your symptoms do not get better with treatment. Your symptoms suddenly get worse. You have a fever. You vomit more than one time. You have stools that are bloody, black, or tarry. Summary Diverticulitis is infection or inflammation of small pouches (diverticula) in the colon that form due to a condition called diverticulosis. Diverticula can trap stool (feces) and bacteria, causing infection and inflammation. You are at higher risk for this condition if you have diverticulosis and you eat a diet that does not include enough fiber. Most cases of this condition are mild and can be treated at home. More severe cases may need to be treated at a hospital. When your condition is under control, your health care provider may recommend that you have an exam called a colonoscopy. This exam can show how severe your diverticula are and whether something else may be causing your symptoms. Keep all follow-up visits as told by your health care provider. This is important. This information is not intended to replace advice given to you by your health care provider. Make sure you discuss any questions you have with your health care provider. Document Revised: 12/22/2018 Document Reviewed: 12/22/2018  Elsevier Patient Education  2022 [employer].

## 2021-01-30 NOTE — Telephone Encounter (Signed)
OV sch'd 02/23/21 with Vikki Ports, apt letter mailed to patient

## 2021-01-30 NOTE — Progress Notes (Signed)
ReDS Vest / Clip - 01/30/21 1500       ReDS Vest / Clip   Station Marker C    Ruler Value 28.5    ReDS Value Range High volume overload    ReDS Actual Value 41

## 2021-01-30 NOTE — Progress Notes (Signed)
ADVANCED HF CLINIC NOTE  Primary Care: John Squibb, MD Primary Cardiologist: Dr. Harl [name]  HF Cardiologist: Dr. Haroldine [name]  Reason for Visit: Chronic Combined Systolic and Diastolic Heart Failure   HPI: John [name] is a 75 y.o. male w/systolic HF due to NICM (cath 9/21 no CAD), h/o CHB s/p PPM 2017,  HTN, DM2, atrial flutter, stage 3b CKD (baseline SCr ~1.5-2.0) and severe AS s/p TAVR 2/22.Marland Kitchen   Echo 7/21 EF 30-35%. RV mildly reduced. Mod-sev MR Moderate AS  TEE 8/21  moderate MR and moderate TR. AoV severely thickened/heavily calcified  John [name] 9/21 No CAD. Moderately elevated filling pressures and moderately reduced CO. CI 1.8. Also concern for RV pacing CM with 42% RV pacing  Several admits in 12/21 for ADHF.   Admitted 2/22 for severe HF and TAVR. Started on dobutamine support. Underwent TVRF 05/03/20. Diuresed and DBA weaned off.   Attempted CRT upgrade 3/22. Left subclavian vein occluded.  Echo 3/22: EF 25-30% moderate MR. TAVR stable  Admitted 7/22 for anemia and hyponatremia. Hgb 14 -> 7.3  got 2u RBCs. EGD with duodenal ulcer with visible vessel so Eliquis held. Could not adequately clip so underwent IR embolization.  Weekly metolazone restarted 9/22.  Admitted 10/8-10/10/22 for A/C CHF. He was diuresed with IV lasix + metolazone. Hospitalization c/b AKI on CKD3. Discharge weight 209 lbs.  Follow up 10/22 weight up 13 lbs in setting of being out of metolazone x 2 weeks. Instructed to take metolazone for 2 days then resume weekly.  Admitted 11/3-11/6/22 w/ acute diverticulitis. GI consulted and underwent EGD 11/4 showing nonobstructive schatzki ring; portal HTN gastropathy; & healed ulcer in duodenal bulb without stigmata of bleeding. Received 4 U PRBCs during hospitalization. He remained DNR.  Today he returns for post hospitalization HF follow up with his wife. He remains weak and will start PT at home soon. He had an episode of BRBPR with BM today, but says color is better  since hospitalization. Overall feeling fatigued. He is SOB with activity. Denies CP, dizziness, edema, or PND/Orthopnea. Appetite ok. No fever or chills. Weight at home 210 pounds. Taking all medications.   Past Medical History:  Diagnosis Date   Atrial flutter (John [name]) 12/2010   Admitted with symptomatic bradycardia (HR 40s), atrial flutter with slow ventricular response 12/2010 + volume overload; AV nodal agents d/c'd and Pradaxa started; RFA in 01/2011   Chronic kidney disease, stage 3b (John [name])    Chronic systolic CHF (congestive heart failure) (John [name])    Class 2 severe obesity due to excess calories with serious comorbidity and body mass index (BMI) of 37.0 to 37.9 in adult John [name]) 07/17/2017   Diabetes mellitus    non insulin dependant   Hematoma, chest wall 05/15/2020   Hyperlipidemia    Hypertension    Mitral regurgitation    NASH (nonalcoholic steatohepatitis)    NICM (nonischemic cardiomyopathy) (John [name])    Osteoarthritis    Polyarticular gout 05/15/2020   Presence of permanent cardiac pacemaker    PSVT (paroxysmal supraventricular tachycardia) (John [name])    Possibly atrial flutter   S/P TAVR (transcatheter aortic valve replacement) 05/03/2020   s/p TAVR with a 29 mm Edwards S3U via the subclavian approach with Dr. Angelena [name] & Dr. Cyndia [name]    Severe aortic stenosis    Current Outpatient Medications  Medication Sig Dispense Refill   ACCU-CHEK AVIVA PLUS test strip TEST BLOOD SUGAR TWICE DAILY BEFORE BREAKFAST AND AT BEDTIME 200 strip 1   acetaminophen (TYLENOL) 325 MG tablet Take 2  tablets (650 mg total) by mouth every 4 (four) hours as needed for headache or mild pain.     allopurinol (ZYLOPRIM) 300 MG tablet Take 150 mg by mouth as needed.     aspirin EC 81 MG tablet Take 1 tablet (81 mg total) by mouth daily with breakfast. 30 tablet 2   benzonatate (TESSALON) 100 MG capsule Take 1 capsule (100 mg total) by mouth 3 (three) times daily as needed for cough. 90 capsule 11   camphor-menthol  (ANTI-ITCH) lotion Apply 1 application topically daily as needed for itching.     carvedilol (COREG) 3.125 MG tablet TAKE 1 TABLET IN THE MORNING AND 2 TABLETS IN THE EVENING 270 tablet 6   cefdinir (OMNICEF) 300 MG capsule Take 1 capsule (300 mg total) by mouth every 12 (twelve) hours. 13 capsule 0   colchicine 0.6 MG tablet Take 1 tablet (0.6 mg total) by mouth daily as needed. 30 tablet 6   Dulaglutide (TRULICITY) 1.5 NL/9.7QB SOPN INJECT 1.5MG (1 PEN) SUBCUTANEOUSLY EVERY WEEK 6 mL 1   ferrous sulfate 325 (65 FE) MG tablet Take 1 tablet (325 mg total) by mouth daily with breakfast.  3   hydrocortisone cream 1 % Apply 1 application topically daily.     insulin degludec (TRESIBA FLEXTOUCH) 100 UNIT/ML FlexTouch Pen Inject 40 Units into the skin at bedtime. 15 mL 2   Insulin Pen Needle (BD PEN NEEDLE NANO U/F) 32G X 4 MM MISC 1 each by Does not apply route 4 (four) times daily. 150 each 5   linaclotide (LINZESS) 145 MCG CAPS capsule Take 1 capsule (145 mcg total) by mouth daily before breakfast. 90 capsule 3   metolazone (ZAROXOLYN) 2.5 MG tablet Take 1 tablet (2.5 mg total) by mouth once a week. Every Monday 5 tablet 6   metroNIDAZOLE (FLAGYL) 500 MG tablet Take 1 tablet (500 mg total) by mouth every 12 (twelve) hours. 13 tablet 0   Multiple Vitamins-Minerals (ONE-A-DAY MENS 50+) TABS Take 1 tablet by mouth daily with breakfast. Men     pantoprazole (PROTONIX) 40 MG tablet Take 1 tablet (40 mg total) by mouth 2 (two) times daily. 60 tablet 1   potassium chloride SA (KLOR-CON M20) 20 MEQ tablet Take 1.5 tablets (30 mEq total) by mouth 2 (two) times daily. Take 2 extra tabs on Mon with metolazone 100 tablet 3   spironolactone (ALDACTONE) 25 MG tablet Take 0.5 tablets (12.5 mg total) by mouth daily. 15 tablet 6   sucralfate (CARAFATE) 1 g tablet Take 1 tablet (1 g total) by mouth 4 (four) times daily. 120 tablet 1   torsemide (DEMADEX) 20 MG tablet TAKE 3 TABLETS (60 MG TOTAL) BY MOUTH 2 (TWO) TIMES  DAILY. 540 tablet 1   No current facility-administered medications for this encounter.   No Known Allergies  Social History   Socioeconomic History   Marital status: Married    Spouse name: Not on file   Number of children: 2   Years of education: Not on file   Highest education level: Not on file  Occupational History   Occupation: Designer, industrial/product    Employer: RETIRED  Tobacco Use   Smoking status: Former    Packs/day: 1.00    Years: 10.00    Pack years: 10.00    Types: Cigarettes    Quit date: 03/26/1986    Years since quitting: 34.8   Smokeless tobacco: Never   Tobacco comments:    "stopped cigarette  smoking 1988"  Vaping  Use   Vaping Use: Never used  Substance and Sexual Activity   Alcohol use: Not Currently    Alcohol/week: 0.0 standard drinks    Comment: "quit alcohol ~ 2007"   Drug use: No   Sexual activity: Yes    Partners: Female  Other Topics Concern   Not on file  Social History Narrative   Not on file   Social Determinants of Health   Financial Resource Strain: Not on file  Food Insecurity: No Food Insecurity   Worried About Running Out of Food in the Last Year: Never true   Ran Out of Food in the Last Year: Never true  Transportation Needs: No Transportation Needs   Lack of Transportation (Medical): No   Lack of Transportation (Non-Medical): No  Physical Activity: Not on file  Stress: Not on file  Social Connections: Not on file  Intimate Partner Violence: Not on file   Family History  Problem Relation Age of Onset   Heart attack Mother    Hypertension Mother    Heart attack Father    Hypertension Father    Heart attack Brother    Colon cancer Neg Hx    BP (!) 102/46   Pulse 88   Wt 99.1 kg (218 lb 6.4 oz)   SpO2 99%   BMI 29.62 kg/m   Wt Readings from Last 3 Encounters:  01/30/21 99.1 kg (218 lb 6.4 oz)  01/26/21 97.5 kg (215 lb)  01/19/21 97.5 kg (215 lb)   PHYSICAL EXAM: General:  NAD. No resp difficulty, frail, arrived  in [facility], pale HEENT: Normal Neck: Supple. JVP to jaw, + v-waves. Carotids 2+ bilat; no bruits. No lymphadenopathy or thryomegaly appreciated. Cor: PMI nondisplaced. Irregular rate & rhythm. No rubs, gallops or murmurs. Lungs: Clear Abdomen: Soft, +tender, +distended. No hepatosplenomegaly. No bruits or masses. Good bowel sounds. Extremities: No cyanosis, clubbing, rash, edema Neuro: Alert & oriented x 3, cranial nerves grossly intact. Moves all 4 extremities w/o difficulty. Affect pleasant.  ECG (personally reviewed): v-paced with PVCs ICD interrogation (personally reviewed): No VT, 100% AF, pacing 6%. ReDs: 41%  ASSESSMENT & PLAN:  1. Acute on Chronic Combined Systolic and Diastolic Heart Failure/NICM: - Echo 08/2015 EF 50-55% (PPM placed in 08/2015)  - Echo 10/2015 EF dropped to 40-45% - Echo 2018 EF 45% - Echo 2021 EF 30-35%, RV mildly reduced  - Echo 3/22 EF 25-30% TAVR ok. Mod MR .-LHC 9/21 that showed no coronary disease.  - based on timing of drop in EF and 42% pacing percentage, concern for RV paced CM +AS. - Left subclavian vein occluded -> unable to upgrade to CRT. - s/p TAVR 2/22 - NYHA IIIb, functional status difficult due to physical deconditioning. Volume status up after hospitalization and has not had metolazone in past week; ReDs 41%, weight up ~8 lbs. - Take metolazone 2.5 mg with kcl 40 today and tomorrow, then every Monday. - Continue torsemide 60 mg bid.  - Continue spiro 12.5 mg daily.  - Decrease carvedilol to 3.125 mg bid to allow for further diuresis. - Off Farxiga due to yeast. - PPM interrogated in clinic No VT. 100% AF. Pacing < 10% so no need for CRT currently.  - Possible candidate for ADI study. - BMET & BNP today; repeat BMET in 1 week.  2. Mitral Regurgitation  - Moderate by TEE 8/21 and Echo 3/22 - Likely functional from dilated CM.  - Will follow. - Repeat echo next year.  3. Aortic Stenosis  -  s/p TAVR 3/22 - Stable on echo.   4. Atrial  Flutter/ Chronic Atrial Fibrillation - s/p AFL ablation.  - In chronic Afib, rate controlled.  - s/p frequent GI bleed so off Eliquis.  - CHA2DS2-VASc Score = at least 5  - Unable to safely restart Eliquis at this time, refer to EP for Watchman consideration (discussed with Dr. Haroldine [name]).  5. Stage IIIb CKD - Followed by nephrology.  - Baseline SCr ~1.8 - Recheck labs today.  6. T2DM - On insulin. - Off Farxiga due to yeast.  7. H/o CHB  - s/p PPM followed by Dr. Lovena Le  - ? RV paced CM. He is now pacing < 10%. Had drop in EF 2 months after pacer was placed.  - Following with Dr.Taylor. Attempted CRT upgrade 3/22 but left subclavian vein occluded.  8. UGIB - EGD in 7/22 with duodenal ulcer with visible vessel. Underwent coiling of GDA.  - s/p EGD 11/22 with no active bleed; received 4 U PRBCs. - Had bleeding with BM today, discharge hgb 7.5.  - CBC today.  Follow up with APP in 2 weeks to reassess volume status; low threshold to re-admit.   Rafael Bihari, FNP  2:35 PM

## 2021-01-31 ENCOUNTER — Ambulatory Visit: Payer: Self-pay | Admitting: *Deleted

## 2021-01-31 ENCOUNTER — Ambulatory Visit (INDEPENDENT_AMBULATORY_CARE_PROVIDER_SITE_OTHER): Payer: Medicare HMO | Admitting: Gastroenterology

## 2021-01-31 ENCOUNTER — Encounter (INDEPENDENT_AMBULATORY_CARE_PROVIDER_SITE_OTHER): Payer: Self-pay | Admitting: Gastroenterology

## 2021-01-31 ENCOUNTER — Encounter: Payer: Self-pay | Admitting: *Deleted

## 2021-01-31 VITALS — BP 86/53 | HR 87 | Temp 97.6°F | Ht 72.0 in | Wt 221.4 lb

## 2021-01-31 DIAGNOSIS — K5900 Constipation, unspecified: Secondary | ICD-10-CM | POA: Diagnosis not present

## 2021-01-31 DIAGNOSIS — D500 Iron deficiency anemia secondary to blood loss (chronic): Secondary | ICD-10-CM

## 2021-01-31 DIAGNOSIS — K5732 Diverticulitis of large intestine without perforation or abscess without bleeding: Secondary | ICD-10-CM

## 2021-01-31 DIAGNOSIS — E1159 Type 2 diabetes mellitus with other circulatory complications: Secondary | ICD-10-CM

## 2021-01-31 DIAGNOSIS — K921 Melena: Secondary | ICD-10-CM

## 2021-01-31 DIAGNOSIS — K7460 Unspecified cirrhosis of liver: Secondary | ICD-10-CM | POA: Diagnosis not present

## 2021-01-31 DIAGNOSIS — I509 Heart failure, unspecified: Secondary | ICD-10-CM

## 2021-01-31 NOTE — Chronic Care Management (AMB) (Signed)
   01/31/2021  John [name] 04-08-1945 [id number]   RN care manager notified by care guide John [name]- pt is now seeing John [name] in [place], saw John [name] on 01/23/21.  Case closed due to John [name] does not participate with chronic care management program.  John [name] [facility], BSN RN Case Manager [place] [phone]

## 2021-01-31 NOTE — Patient Instructions (Signed)
Schedule colonoscopy Continue oral iron daily Avoid using Eliquis until further notice Continue Miralax 3 times a day, if no improvement then stop MiraLAX and start taking Linzess 72 mcg every day Ask PCP about hepatitis a and B vaccination Perform repeat blood work-up in mid December 2022 Perform repeat liver ultrasound in mid December 2022 - Reduce salt intake to <2 g per day - Can take Tylenol max of 2 g per day (650 mg q8h) for pain - Avoid NSAIDs for pain - Avoid eating raw oysters/shellfish - Ensure every night before going to sleep

## 2021-01-31 NOTE — Progress Notes (Signed)
[name], M.D. Gastroenterology & Hepatology [facility] [address] [place],  [address]  Primary Care Physician: [name], MD [place] [address]  I will communicate my assessment and recommendations to the referring MD via EMR.  Problems: NASH cirrhosis Duodenal ulcer s/p embolization, healed Recurrent melena Acute diverticulitis Constipation  History of Present Illness: John [name] is a 75 y.o. male with PMH afib , systolic HF, NASH cirrhosis, DM on insulin, HTN, HLD  who presents for follow up after recent hospitalization for acute diverticulitis and presence of melena.  The patient was last seen on 01/19/2021. At that time, the patient was feeling well but endorsed having constipation so he had his MiraLAX increased.  It was discussed with him that we will consider doing an EGD after discussing his heart failure status with his cardiologist.  However, 6 days later the patient presented worsening constipation but developed new onset nausea and generalized fatigue so he was advised to go to the ER as his symptoms were very severe.  I sent the prescription for Linzess at that time to his pharmacy but he did not pick it up.  The patient was hospitalized on 01/26/2021.  At that time he underwent a CT of the abdomen and pelvis without IV contrast that showed changes consistent with diverticulitis at the descending colon and sigmoid colon junction.  This was treated with antibiotic IV with adequate response.  However, during his hospitalization he had episodes of melena and subsequent drop of his hemoglobin down to 6.6 for which he had to be transfused.  Underwent an EGD on 01/27/2021 showed a nonobstructive Schatzki's ring, portal hypertensive gastropathy and a healed 20 mm ulcer in the duodenal bulb but no presence of ongoing bleeding.  He was discharged home off Eliquis and on cefdinir and  metronidazole for total 7 days p.o.  Patient reported feeling this feeling well.  He reports that couple of days before going to the ER he noticed persistent black stools.  The stools have remained black after being discharged from the hospital.  He denies any fresh blood in his stools, nausea, vomiting, abdominal pain, distention, changes in his weight.  He has not restarted his Eliquis yet and saw his cardiologist yesterday who recommended holding on restarting this medication. Currently moving his bowels once a day but has to strain significantly.  He is taking MiraLAX every day but increase the dose recently as he was just discharged from the hospital.  Most recent hemoglobin was checked yesterday, with a value of 8.0 and MCV of 87, platelets of 114, BUN of 78 (lower than previous value a day before).  Last Colonoscopy:(2019) 3 polyps in [facility], diverticulosis, Advised to repeat in 5 years. Last Endoscopy:(10/18/20) normal esophagus, erythematous mucosa in gastric body, non bleeding duodenal ulcer with a non bleeding visible vessel (forrest Class IIa). Injected, clip placed, no specimens collected.   Past Medical History: Past Medical History:  Diagnosis Date   Atrial flutter ([place]) 12/2010   Admitted with symptomatic bradycardia (HR 40s), atrial flutter with slow ventricular response 12/2010 + volume overload; AV nodal agents d/c'd and Pradaxa started; RFA in 01/2011   Chronic kidney disease, stage 3b (HCC)    Chronic systolic CHF (congestive heart failure) ([place])    Class 2 severe obesity due to excess calories with serious comorbidity and body mass index (BMI) of 37.0 to 37.9 in adult [facility]) 07/17/2017   Diabetes mellitus  non insulin dependant   Hematoma, chest wall 05/15/2020   Hyperlipidemia    Hypertension    Mitral regurgitation    NASH (nonalcoholic steatohepatitis)    NICM (nonischemic cardiomyopathy) (HCC)    Osteoarthritis    Polyarticular gout 05/15/2020   Presence of permanent  cardiac pacemaker    PSVT (paroxysmal supraventricular tachycardia) (HCC)    Possibly atrial flutter   S/P TAVR (transcatheter aortic valve replacement) 05/03/2020   s/p TAVR with a 29 mm Edwards S3U via the subclavian approach with Dr. Angelena Form & Dr. Cyndia Bent    Severe aortic stenosis     Past Surgical History: Past Surgical History:  Procedure Laterality Date   ATRIAL FLUTTER ABLATION N/A 02/07/2011   Procedure: ATRIAL FLUTTER ABLATION;  Surgeon: Evans Lance, MD;  Location: [facility] CATH LAB;  Service: Cardiovascular;  Laterality: N/A;   BIV UPGRADE N/A 06/08/2020   Procedure: BIV PPM UPGRADE;  Surgeon: Evans Lance, MD;  Location: [place] CV LAB;  Service: Cardiovascular;  Laterality: N/A;   CARDIAC ELECTROPHYSIOLOGY STUDY AND ABLATION  02/07/2011   CARDIOVERSION N/A 03/23/2016   Procedure: CARDIOVERSION;  Surgeon: Evans Lance, MD;  Location: [place];  Service: Cardiovascular;  Laterality: N/A;   COLONOSCOPY N/A 04/17/2017   [place]: 3 polyps in [facility], diverticulosis   EP IMPLANTABLE DEVICE N/A 08/29/2015   Procedure: Pacemaker Implant;  Surgeon: Evans Lance, MD;  Location: [place] CV LAB;  Service: Cardiovascular;  Laterality: N/A;   EP IMPLANTABLE DEVICE N/A 12/07/2015   Procedure: PPM Lead Revision/Repair;  Surgeon: Evans Lance, MD;  Location: [place] CV LAB;  Service: Cardiovascular;  Laterality: N/A;   ESOPHAGOGASTRODUODENOSCOPY (EGD) WITH PROPOFOL N/A 10/18/2020   [place]: normal esophagus, erythematous mucosa in gastric body, non bleeding duodenal ulcer with a non bleeding visible vessel (forrest Class IIa). Injected, clip placed, no specimens collected.   IR ANGIOGRAM SELECTIVE EACH ADDITIONAL VESSEL  10/19/2020   IR ANGIOGRAM SELECTIVE EACH ADDITIONAL VESSEL  10/19/2020   IR ANGIOGRAM VISCERAL SELECTIVE  10/19/2020   IR ANGIOGRAM VISCERAL SELECTIVE  10/19/2020   IR EMBO ART  VEN HEMORR LYMPH EXTRAV  INC GUIDE ROADMAPPING  10/19/2020   IR US GUIDE VASC  ACCESS RIGHT  10/19/2020   KNEE ARTHROSCOPY  03/27/2003   left   LUMBAR SPINE SURGERY     "I've had 6 ORs 1972 thru 2004"   POLYPECTOMY  04/17/2017   Procedure: POLYPECTOMY;  Surgeon: Rogene Houston, MD;  [facility];  Service: Endoscopy;;  transverse colon x3;   RIGHT HEART CATH N/A 04/28/2020   Procedure: RIGHT HEART CATH;  Surgeon: Jolaine Artist, MD;  Location: [place] CV LAB;  Service: Cardiovascular;  Laterality: N/A;   RIGHT/LEFT HEART CATH AND CORONARY ANGIOGRAPHY N/A 12/21/2019   Procedure: RIGHT/LEFT HEART CATH AND CORONARY ANGIOGRAPHY;  Surgeon: Nelva Bush, MD;  Location: [place] CV LAB;  Service: Cardiovascular;  Laterality: N/A;   TEE WITHOUT CARDIOVERSION N/A 11/05/2019   Procedure: TRANSESOPHAGEAL ECHOCARDIOGRAM (TEE) WITH PROPOFOL;  Surgeon: Arnoldo Lenis, MD;  [facility];  Service: Endoscopy;  Laterality: N/A;   TEE WITHOUT CARDIOVERSION N/A 05/03/2020   Procedure: TRANSESOPHAGEAL ECHOCARDIOGRAM (TEE);  Surgeon: Burnell Blanks, MD;  Location: [place];  Service: Open Heart Surgery;  Laterality: N/A;    Family History: Family History  Problem Relation Age of Onset   Heart attack Mother    Hypertension Mother    Heart attack Father    Hypertension Father  Heart attack Brother    Colon cancer Neg Hx     Social History: Social History   Tobacco Use  Smoking Status Former   Packs/day: 1.00   Years: 10.00   Pack years: 10.00   Types: Cigarettes   Quit date: 03/26/1986   Years since quitting: 34.8  Smokeless Tobacco Never  Tobacco Comments   "stopped cigarette  smoking 1988"   Social History   Substance and Sexual Activity  Alcohol Use Not Currently   Alcohol/week: 0.0 standard drinks   Comment: "quit alcohol ~ 2007"   Social History   Substance and Sexual Activity  Drug Use No    Allergies: No Known Allergies  Medications: Current Outpatient Medications  Medication Sig Dispense Refill    ACCU-CHEK AVIVA PLUS test strip TEST BLOOD SUGAR TWICE DAILY BEFORE BREAKFAST AND AT BEDTIME 200 strip 1   acetaminophen (TYLENOL) 325 MG tablet Take 2 tablets (650 mg total) by mouth every 4 (four) hours as needed for headache or mild pain.     allopurinol (ZYLOPRIM) 300 MG tablet Take 150 mg by mouth as needed.     aspirin EC 81 MG tablet Take 1 tablet (81 mg total) by mouth daily with breakfast. 30 tablet 2   benzonatate (TESSALON) 100 MG capsule Take 1 capsule (100 mg total) by mouth 3 (three) times daily as needed for cough. 90 capsule 11   camphor-menthol (ANTI-ITCH) lotion Apply 1 application topically daily as needed for itching.     carvedilol (COREG) 3.125 MG tablet Take 1 tablet (3.125 mg total) by mouth 2 (two) times daily with a meal. 60 tablet 4   cefdinir (OMNICEF) 300 MG capsule Take 1 capsule (300 mg total) by mouth every 12 (twelve) hours. 13 capsule 0   colchicine 0.6 MG tablet Take 1 tablet (0.6 mg total) by mouth daily as needed. 30 tablet 6   Dulaglutide (TRULICITY) 1.5 AY/3.0ZS SOPN INJECT 1.5MG (1 PEN) SUBCUTANEOUSLY EVERY WEEK 6 mL 1   ferrous sulfate 325 (65 FE) MG tablet Take 1 tablet (325 mg total) by mouth daily with breakfast.  3   hydrocortisone cream 1 % Apply 1 application topically daily.     insulin degludec (TRESIBA FLEXTOUCH) 100 UNIT/ML FlexTouch Pen Inject 40 Units into the skin at bedtime. 15 mL 2   Insulin Pen Needle (BD PEN NEEDLE NANO U/F) 32G X 4 MM MISC 1 each by Does not apply route 4 (four) times daily. 150 each 5   linaclotide (LINZESS) 145 MCG CAPS capsule Take 1 capsule (145 mcg total) by mouth daily before breakfast. 90 capsule 3   metolazone (ZAROXOLYN) 2.5 MG tablet Take 1 tablet (2.5 mg total) by mouth once a week. Every Monday 5 tablet 4   metroNIDAZOLE (FLAGYL) 500 MG tablet Take 1 tablet (500 mg total) by mouth every 12 (twelve) hours. 13 tablet 0   Multiple Vitamins-Minerals (ONE-A-DAY MENS 50+) TABS Take 1 tablet by mouth daily with  breakfast. Men     pantoprazole (PROTONIX) 40 MG tablet Take 1 tablet (40 mg total) by mouth 2 (two) times daily. 60 tablet 1   potassium chloride SA (KLOR-CON M20) 20 MEQ tablet Take 1.5 tablets (30 mEq total) by mouth 2 (two) times daily. Take 2 extra tabs on Mon with metolazone 100 tablet 3   spironolactone (ALDACTONE) 25 MG tablet Take 0.5 tablets (12.5 mg total) by mouth daily. 15 tablet 6   sucralfate (CARAFATE) 1 g tablet Take 1 tablet (1 g total) by mouth  4 (four) times daily. 120 tablet 1   torsemide (DEMADEX) 20 MG tablet TAKE 3 TABLETS (60 MG TOTAL) BY MOUTH 2 (TWO) TIMES DAILY. (Patient taking differently: Take 20 mg by mouth 2 (two) times daily.) 540 tablet 1   No current facility-administered medications for this visit.    Review of Systems: GENERAL: negative for malaise, night sweats HEENT: No changes in hearing or vision, no nose bleeds or other nasal problems. NECK: Negative for lumps, goiter, pain and significant neck swelling RESPIRATORY: Negative for cough, wheezing CARDIOVASCULAR: Negative for chest pain, leg swelling, palpitations, orthopnea GI: SEE HPI MUSCULOSKELETAL: Negative for joint pain or swelling, back pain, and muscle pain. SKIN: Negative for lesions, rash PSYCH: Negative for sleep disturbance, mood disorder and recent psychosocial stressors. HEMATOLOGY Negative for prolonged bleeding, bruising easily, and swollen nodes. ENDOCRINE: Negative for cold or heat intolerance, polyuria, polydipsia and goiter. NEURO: negative for tremor, gait imbalance, syncope and seizures. The remainder of the review of systems is noncontributory.   Physical Exam: BP (!) 86/53 (BP Location: Left Arm, Patient Position: Sitting, Cuff Size: Large)   Pulse 87   Temp 97.6 F (36.4 C) (Oral)   Ht 6' (1.829 m)   Wt 221 lb 6.4 oz (100.4 kg)   BMI 30.03 kg/m  GENERAL: The patient is AO x3, in no acute distress. HEENT: Head is normocephalic and atraumatic. EOMI are intact. Mouth is  well hydrated and without lesions. NECK: Supple. No masses LUNGS: Clear to auscultation. No presence of rhonchi/wheezing/rales. Adequate chest expansion HEART: RRR, normal s1 and s2. ABDOMEN: Soft, nontender, no guarding, no peritoneal signs, and nondistended. BS +. No masses. EXTREMITIES: Without any cyanosis, clubbing, rash, lesions or edema. NEUROLOGIC: AOx3, no focal motor deficit. SKIN: no jaundice, no rashes  Imaging/Labs: as above  I personally reviewed and interpreted the available labs, imaging and endoscopic files.  Impression and Plan: John [name] is a 75 y.o. male with PMH afib , systolic HF, NASH cirrhosis, DM on insulin, HTN, HLD  who presents for follow up after recent hospitalization for acute diverticulitis and presence of melena.  Regarding his diverticulitis, he presented an episode of uncomplicated diverticulitis that has responded to oral antibiotics and he should finish his antibiotic course.  No further action is warranted for this at this moment, but he should avoid taking any NSAIDs to avoid any recurrent episodes of diverticulitis.  In terms of his melena, he underwent an EGD that was negative for any upper gastrointestinal source.  As he had a significant drop in his hemoglobin, we will proceed with colonoscopy first to evaluate any lower gastrointestinal source leading to his anemia and melena but if he has recurrent episodes with negative colonoscopy, we will need to proceed with capsule endoscopy.  I agree that he should hold on his Eliquis for now until we have rule out a cause.  It is possible that he has significant portal hypertensive gastropathy leading to intermittent bleeding, but will need to rule out other sources.  He can continue taking his oral iron as advised upon discharge from the hospital.  We will recheck iron stores next month.  Patient has persisted with significant straining when he has bowel movements.  I advised him to take up to 3 capfuls  of MiraLAX every day to soften his bowel movements.  However if he does not improve with this, he may need to stop the MiraLAX and start taking the [place] I sent to his pharmacy.  Finally, regarding his liver  cirrhosis, he has not presented any decompensating event and is likely related to NASH given his previous work-up.  Now he has evidence of thrombocytopenia and splenomegaly.  He is a CPT class A with low MELD score.  He is up-to-date in terms of his [facility] and variceal screening.  We will obtain MELD labs and imaging in December 2022.  - Schedule colonoscopy - Continue oral iron daily - Avoid using Eliquis until further notice - Continue Miralax 3 times a day, if no improvement then stop MiraLAX and start taking Linzess 72 mcg every day - Ask PCP about hepatitis A and B vaccination - CBC, iron stores, AFP and MELD labs mid December 2022 - Perform liver ultrasound in mid December 2022 - Reduce salt intake to <2 g per day - Can take Tylenol max of 2 g per day (650 mg q8h) for pain - Avoid NSAIDs for pain - Avoid eating raw oysters/shellfish - Ensure every night before going to sleep - RTC 6 months  All questions were answered.      Harvel Quale, MD Gastroenterology and Hepatology [facility]

## 2021-02-01 ENCOUNTER — Other Ambulatory Visit (INDEPENDENT_AMBULATORY_CARE_PROVIDER_SITE_OTHER): Payer: Self-pay

## 2021-02-01 ENCOUNTER — Ambulatory Visit (INDEPENDENT_AMBULATORY_CARE_PROVIDER_SITE_OTHER): Payer: Medicare HMO | Admitting: "Endocrinology

## 2021-02-01 ENCOUNTER — Encounter (HOSPITAL_COMMUNITY): Payer: Self-pay | Admitting: Internal Medicine

## 2021-02-01 ENCOUNTER — Encounter (INDEPENDENT_AMBULATORY_CARE_PROVIDER_SITE_OTHER): Payer: Self-pay

## 2021-02-01 ENCOUNTER — Other Ambulatory Visit: Payer: Self-pay

## 2021-02-01 ENCOUNTER — Telehealth (INDEPENDENT_AMBULATORY_CARE_PROVIDER_SITE_OTHER): Payer: Self-pay

## 2021-02-01 VITALS — BP 114/58 | HR 68 | Ht 72.0 in | Wt 219.4 lb

## 2021-02-01 DIAGNOSIS — E782 Mixed hyperlipidemia: Secondary | ICD-10-CM | POA: Diagnosis not present

## 2021-02-01 DIAGNOSIS — K222 Esophageal obstruction: Secondary | ICD-10-CM | POA: Diagnosis not present

## 2021-02-01 DIAGNOSIS — E1122 Type 2 diabetes mellitus with diabetic chronic kidney disease: Secondary | ICD-10-CM | POA: Diagnosis not present

## 2021-02-01 DIAGNOSIS — K5732 Diverticulitis of large intestine without perforation or abscess without bleeding: Secondary | ICD-10-CM | POA: Diagnosis not present

## 2021-02-01 DIAGNOSIS — I130 Hypertensive heart and chronic kidney disease with heart failure and stage 1 through stage 4 chronic kidney disease, or unspecified chronic kidney disease: Secondary | ICD-10-CM | POA: Diagnosis not present

## 2021-02-01 DIAGNOSIS — K7581 Nonalcoholic steatohepatitis (NASH): Secondary | ICD-10-CM | POA: Diagnosis not present

## 2021-02-01 DIAGNOSIS — E1159 Type 2 diabetes mellitus with other circulatory complications: Secondary | ICD-10-CM

## 2021-02-01 DIAGNOSIS — K3189 Other diseases of stomach and duodenum: Secondary | ICD-10-CM | POA: Diagnosis not present

## 2021-02-01 DIAGNOSIS — E1165 Type 2 diabetes mellitus with hyperglycemia: Secondary | ICD-10-CM | POA: Diagnosis not present

## 2021-02-01 DIAGNOSIS — I5043 Acute on chronic combined systolic (congestive) and diastolic (congestive) heart failure: Secondary | ICD-10-CM | POA: Diagnosis not present

## 2021-02-01 DIAGNOSIS — I10 Essential (primary) hypertension: Secondary | ICD-10-CM | POA: Diagnosis not present

## 2021-02-01 DIAGNOSIS — D62 Acute posthemorrhagic anemia: Secondary | ICD-10-CM | POA: Diagnosis not present

## 2021-02-01 MED ORDER — PEG 3350-KCL-NA BICARB-NACL 420 G PO SOLR: 4000.0000 mL | ORAL | 0 refills | Status: DC

## 2021-02-01 NOTE — Progress Notes (Signed)
SS

## 2021-02-01 NOTE — Progress Notes (Signed)
02/01/2021, 6:01 PM   Endocrinology follow-up note   Subjective:    Patient ID: John [name], male    DOB: [birth year].  John [name] is here to follow-up for the management of his currently uncontrolled type 2 diabetes, hyperlipidemia.    PMD:  [name], MD.   Past Medical History:  Diagnosis Date   Atrial flutter ([place]) 12/2010   Admitted with symptomatic bradycardia (HR 40s), atrial flutter with slow ventricular response 12/2010 + volume overload; AV nodal agents d/c'd and Pradaxa started; RFA in 01/2011   Chronic kidney disease, stage 3b (HCC)    Chronic systolic CHF (congestive heart failure) ([place])    Class 2 severe obesity due to excess calories with serious comorbidity and body mass index (BMI) of 37.0 to 37.9 in adult ([place]) 07/17/2017   Diabetes mellitus    non insulin dependant   Hematoma, chest wall 05/15/2020   Hyperlipidemia    Hypertension    Mitral regurgitation    NASH (nonalcoholic steatohepatitis)    NICM (nonischemic cardiomyopathy) (HCC)    Osteoarthritis    Polyarticular gout 05/15/2020   Presence of permanent cardiac pacemaker    PSVT (paroxysmal supraventricular tachycardia) (HCC)    Possibly atrial flutter   S/P TAVR (transcatheter aortic valve replacement) 05/03/2020   s/p TAVR with a 29 mm Edwards S3U via the subclavian approach with Dr. Angelena Form & Dr. Cyndia Bent    Severe aortic stenosis    Past Surgical History:  Procedure Laterality Date   ATRIAL FLUTTER ABLATION N/A 02/07/2011   Procedure: ATRIAL FLUTTER ABLATION;  Surgeon: Evans Lance, MD;  Location: [facility] CATH LAB;  Service: Cardiovascular;  Laterality: N/A;   BIV UPGRADE N/A 06/08/2020   Procedure: BIV PPM UPGRADE;  Surgeon: Evans Lance, MD;  Location: [place] CV LAB;  Service: Cardiovascular;  Laterality: N/A;   CARDIAC ELECTROPHYSIOLOGY STUDY AND ABLATION  02/07/2011   CARDIOVERSION N/A  03/23/2016   Procedure: CARDIOVERSION;  Surgeon: Evans Lance, MD;  Location: [place];  Service: Cardiovascular;  Laterality: N/A;   COLONOSCOPY N/A 04/17/2017   Rehman: 3 polyps in [facility], diverticulosis   EP IMPLANTABLE DEVICE N/A 08/29/2015   Procedure: Pacemaker Implant;  Surgeon: Evans Lance, MD;  Location: [place] CV LAB;  Service: Cardiovascular;  Laterality: N/A;   EP IMPLANTABLE DEVICE N/A 12/07/2015   Procedure: PPM Lead Revision/Repair;  Surgeon: Evans Lance, MD;  Location: [place] CV LAB;  Service: Cardiovascular;  Laterality: N/A;   ESOPHAGOGASTRODUODENOSCOPY (EGD) WITH PROPOFOL N/A 10/18/2020   Castaneda: normal esophagus, erythematous mucosa in gastric body, non bleeding duodenal ulcer with a non bleeding visible vessel (forrest Class IIa). Injected, clip placed, no specimens collected.   ESOPHAGOGASTRODUODENOSCOPY (EGD) WITH PROPOFOL N/A 01/27/2021   Procedure: ESOPHAGOGASTRODUODENOSCOPY (EGD) WITH PROPOFOL;  Surgeon: [name], DO;  [facility];  Service: Endoscopy;  Laterality: N/A;   IR ANGIOGRAM SELECTIVE EACH ADDITIONAL VESSEL  10/19/2020   IR ANGIOGRAM SELECTIVE EACH ADDITIONAL VESSEL  10/19/2020   IR ANGIOGRAM VISCERAL SELECTIVE  10/19/2020   IR ANGIOGRAM VISCERAL SELECTIVE  10/19/2020  IR EMBO ART  VEN HEMORR LYMPH EXTRAV  INC GUIDE ROADMAPPING  10/19/2020   IR US GUIDE VASC ACCESS RIGHT  10/19/2020   KNEE ARTHROSCOPY  03/27/2003   left   LUMBAR SPINE SURGERY     "I've had 6 ORs 1972 thru 2004"   POLYPECTOMY  04/17/2017   Procedure: POLYPECTOMY;  Surgeon: Rogene Houston, MD;  [facility];  Service: Endoscopy;;  transverse colon x3;   RIGHT HEART CATH N/A 04/28/2020   Procedure: RIGHT HEART CATH;  Surgeon: Jolaine Artist, MD;  Location: [place] CV LAB;  Service: Cardiovascular;  Laterality: N/A;   RIGHT/LEFT HEART CATH AND CORONARY ANGIOGRAPHY N/A 12/21/2019   Procedure: RIGHT/LEFT HEART CATH AND CORONARY  ANGIOGRAPHY;  Surgeon: Nelva Bush, MD;  Location: [place] CV LAB;  Service: Cardiovascular;  Laterality: N/A;   TEE WITHOUT CARDIOVERSION N/A 11/05/2019   Procedure: TRANSESOPHAGEAL ECHOCARDIOGRAM (TEE) WITH PROPOFOL;  Surgeon: Arnoldo Lenis, MD;  [facility];  Service: Endoscopy;  Laterality: N/A;   TEE WITHOUT CARDIOVERSION N/A 05/03/2020   Procedure: TRANSESOPHAGEAL ECHOCARDIOGRAM (TEE);  Surgeon: Burnell Blanks, MD;  Location: [place];  Service: Open Heart Surgery;  Laterality: N/A;   Social History   Socioeconomic History   Marital status: Married    Spouse name: Not on file   Number of children: 2   Years of education: Not on file   Highest education level: Not on file  Occupational History   Occupation: Designer, industrial/product    Employer: RETIRED  Tobacco Use   Smoking status: Former    Packs/day: 1.00    Years: 10.00    Pack years: 10.00    Types: Cigarettes    Quit date: 03/26/1986    Years since quitting: 34.8   Smokeless tobacco: Never   Tobacco comments:    "stopped cigarette  smoking 1988"  Vaping Use   Vaping Use: Never used  Substance and Sexual Activity   Alcohol use: Not Currently    Alcohol/week: 0.0 standard drinks    Comment: "quit alcohol ~ 2007"   Drug use: No   Sexual activity: Yes    Partners: Female  Other Topics Concern   Not on file  Social History Narrative   Not on file   Social Determinants of Health   Financial Resource Strain: Not on file  Food Insecurity: No Food Insecurity   Worried About [place] in the Last Year: Never true   [place] in the Last Year: Never true  Transportation Needs: No Transportation Needs   Lack of Transportation (Medical): No   Lack of Transportation (Non-Medical): No  Physical Activity: Not on file  Stress: Not on file  Social Connections: Not on file   Outpatient Encounter Medications as of 02/01/2021  Medication Sig   ACCU-CHEK AVIVA PLUS test strip TEST  BLOOD SUGAR TWICE DAILY BEFORE BREAKFAST AND AT BEDTIME   acetaminophen (TYLENOL) 325 MG tablet Take 2 tablets (650 mg total) by mouth every 4 (four) hours as needed for headache or mild pain.   allopurinol (ZYLOPRIM) 300 MG tablet Take 150 mg by mouth as needed.   aspirin EC 81 MG tablet Take 1 tablet (81 mg total) by mouth daily with breakfast.   benzonatate (TESSALON) 100 MG capsule Take 1 capsule (100 mg total) by mouth 3 (three) times daily as needed for cough.   camphor-menthol (ANTI-ITCH) lotion Apply 1 application topically daily as needed for itching.   carvedilol (  COREG) 3.125 MG tablet Take 1 tablet (3.125 mg total) by mouth 2 (two) times daily with a meal.   cefdinir (OMNICEF) 300 MG capsule Take 1 capsule (300 mg total) by mouth every 12 (twelve) hours.   colchicine 0.6 MG tablet Take 1 tablet (0.6 mg total) by mouth daily as needed.   Dulaglutide (TRULICITY) 1.5 QI/3.4VQ SOPN INJECT 1.5MG (1 PEN) SUBCUTANEOUSLY EVERY WEEK   ferrous sulfate 325 (65 FE) MG tablet Take 1 tablet (325 mg total) by mouth daily with breakfast.   hydrocortisone cream 1 % Apply 1 application topically daily.   insulin degludec (TRESIBA FLEXTOUCH) 100 UNIT/ML FlexTouch Pen Inject 40 Units into the skin at bedtime.   Insulin Pen Needle (BD PEN NEEDLE NANO U/F) 32G X 4 MM MISC 1 each by Does not apply route 4 (four) times daily.   linaclotide (LINZESS) 145 MCG CAPS capsule Take 1 capsule (145 mcg total) by mouth daily before breakfast.   metolazone (ZAROXOLYN) 2.5 MG tablet Take 1 tablet (2.5 mg total) by mouth once a week. Every Monday   metroNIDAZOLE (FLAGYL) 500 MG tablet Take 1 tablet (500 mg total) by mouth every 12 (twelve) hours.   Multiple Vitamins-Minerals (ONE-A-DAY MENS 50+) TABS Take 1 tablet by mouth daily with breakfast. Men   pantoprazole (PROTONIX) 40 MG tablet Take 1 tablet (40 mg total) by mouth 2 (two) times daily.   polyethylene glycol-electrolytes (TRILYTE) 420 g solution Take 4,000 mLs by  mouth as directed.   potassium chloride SA (KLOR-CON M20) 20 MEQ tablet Take 1.5 tablets (30 mEq total) by mouth 2 (two) times daily. Take 2 extra tabs on Mon with metolazone   spironolactone (ALDACTONE) 25 MG tablet Take 0.5 tablets (12.5 mg total) by mouth daily.   sucralfate (CARAFATE) 1 g tablet Take 1 tablet (1 g total) by mouth 4 (four) times daily.   torsemide (DEMADEX) 20 MG tablet TAKE 3 TABLETS (60 MG TOTAL) BY MOUTH 2 (TWO) TIMES DAILY. (Patient taking differently: Take 20 mg by mouth 2 (two) times daily.)   No facility-administered encounter medications on file as of 02/01/2021.    ALLERGIES: No Known Allergies  VACCINATION STATUS: Immunization History  Administered Date(s) Administered   Fluad Quad(high Dose 65+) 01/01/2021   Influenza,inj,Quad PF,6+ Mos 11/24/2014, 11/29/2015, 12/11/2016, 12/25/2017, 12/20/2018   Influenza-Unspecified 11/24/2013, 12/25/2019   Moderna Covid-19 Vaccine Bivalent Booster 14yr & up 01/11/2021   Moderna Sars-Covid-2 Vaccination 05/03/2019, 06/03/2019, 01/23/2020   Pneumococcal Conjugate-13 11/19/2013   Pneumococcal Polysaccharide-23 11/29/2015   Pneumococcal-Unspecified 03/24/2009   Td 06/23/2010    Diabetes He presents for his follow-up (Telephone visit due to coronavirus pandemic) diabetic visit. He has type 2 diabetes mellitus. Onset time: He was diagnosed at approximate age of [age]years. His disease course has been worsening. There are no hypoglycemic associated symptoms. Pertinent negatives for hypoglycemia include no confusion, pallor or seizures. There are no diabetic associated symptoms. Pertinent negatives for diabetes include no fatigue, no polydipsia, no polyphagia, no polyuria and no weakness. There are no hypoglycemic complications. Symptoms are worsening. Diabetic complications include heart disease and nephropathy. Risk factors for coronary artery disease include diabetes mellitus, dyslipidemia, family history, obesity, male sex,  hypertension, sedentary lifestyle and tobacco exposure. Current diabetic treatments: He is on Trulicity 1.5 mg Septanest weekly, and Farxiga 10 mg p.o. daily. His weight is increasing steadily. He is following a generally unhealthy diet. When asked about meal planning, he reported none. He has not had a previous visit with a dietitian. He never  participates in exercise. His home blood glucose trend is increasing steadily. His breakfast blood glucose range is generally >200 mg/dl. His bedtime blood glucose range is generally >200 mg/dl. His overall blood glucose range is >200 mg/dl. (He presents with a meter showing only 3 readings in the last 15 days.  His average blood glucose is still above 200 mg.  His recent labs show A1c of 9.2%, increasing from 8.2%.  He did not document any hypoglycemia recently.   ) An ACE inhibitor/angiotensin II receptor blocker is being taken. He does not see a podiatrist.Eye exam is current.  Hyperlipidemia This is a chronic problem. The current episode started more than 1 year ago. The problem is controlled. Exacerbating diseases include diabetes and obesity. Pertinent negatives include no myalgias. Current antihyperlipidemic treatment includes statins. Risk factors for coronary artery disease include diabetes mellitus, dyslipidemia, family history, obesity, male sex, hypertension and a sedentary lifestyle.    Review of systems  Constitutional: + He lost 30+ pounds.  He is recovering from cardiac surgery for aortic stenosis.   Current  Body mass index is 29.76 kg/m. Marland Kitchen    Objective:    Vitals with BMI 02/01/2021 01/31/2021 01/30/2021  Height 6' 0"  6' 0"  -  Weight 219 lbs 6 oz 221 lbs 6 oz 218 lbs 6 oz  BMI 29.75 95.62 13.08  Systolic 657 86 846  Diastolic 58 53 46  Pulse 68 87 88     Physical Exam- Limited    CMP     Component Value Date/Time   NA 135 01/30/2021 1538   NA 138 01/17/2021 0935   K 4.1 01/30/2021 1538   CL 104 01/30/2021 1538   CO2 22  01/30/2021 1538   GLUCOSE 165 (H) 01/30/2021 1538   BUN 78 (H) 01/30/2021 1538   BUN 58 (H) 01/17/2021 0935   CREATININE 1.93 (H) 01/30/2021 1538   CREATININE 1.32 (H) 03/07/2016 1043   CALCIUM 8.3 (L) 01/30/2021 1538   PROT 6.7 01/26/2021 1305   PROT 8.1 11/23/2020 1110   ALBUMIN 2.8 (L) 01/26/2021 1305   ALBUMIN 3.8 11/23/2020 1110   AST 35 01/26/2021 1305   ALT 22 01/26/2021 1305   ALKPHOS 251 (H) 01/26/2021 1305   BILITOT 2.0 (H) 01/26/2021 1305   BILITOT 1.4 (H) 11/23/2020 1110   GFRNONAA 36 (L) 01/30/2021 1538   GFRAA 52 (L) 04/04/2020 0934     Diabetic Labs (most recent): Lab Results  Component Value Date   HGBA1C 9.2 (H) 12/31/2020   HGBA1C 8.2 (H) 10/17/2020   HGBA1C 9.9 (A) 08/25/2020    Lipid Panel     Component Value Date/Time   CHOL 178 08/12/2019 0809   CHOL 142 01/17/2018 0835   TRIG 100 08/12/2019 0809   HDL 51 08/12/2019 0809   HDL 53 01/17/2018 0835   CHOLHDL 3.5 08/12/2019 0809   VLDL 20 08/12/2019 0809   LDLCALC 107 (H) 08/12/2019 0809   LDLCALC 71 01/17/2018 0835     Lab Results  Component Value Date   TSH 1.940 07/10/2017   TSH 1.563 08/25/2015   TSH 1.206 01/05/2011   FREET4 1.12 07/10/2017        Assessment & Plan:   1. DM type 2 causing vascular disease ([place])  - John [name] has currently uncontrolled symptomatic type 2 DM since 75 years of age.  He presents with a meter showing only 3 readings in the last 15 days.  His average blood glucose is still above 200 mg.  His recent labs show A1c of 9.2%, increasing from 8.2%.  He did not document any hypoglycemia recently.   -He does not report any major hypoglycemia. Recent labs reviewed.  -his diabetes is complicated by obesity/sedentary life , CKD, CHF , congestive heart failure/bradyarrhythmia and YSIDRO RAMSAY remains at a high risk for more acute and chronic complications which include CAD, CVA, CKD, retinopathy, and neuropathy. These are all discussed in detail with the  patient.  - I encouraged him to switch to  unprocessed or minimally processed complex starch and increased protein intake (animal or plant source), fruits, and vegetables.  - he acknowledges that there is a room for improvement in his food and drink choices. - Suggestion is made for him to avoid simple carbohydrates  from his diet including Cakes, Sweet Desserts, Ice Cream, Soda (diet and regular), Sweet Tea, Candies, Chips, Cookies, Store Bought Juices, Alcohol in Excess of  1-2 drinks a day, Artificial Sweeteners,  Coffee Creamer, and "Sugar-free" Products, Lemonade. This will help patient to have more stable blood glucose profile and potentially avoid unintended weight gain.   - he is advised to stick to a routine mealtimes to eat 3 meals  a day and avoid unnecessary snacks ( to snack only to correct hypoglycemia).    - I have approached him with the following individualized plan to manage diabetes and patient agrees:   -Based on his presentation with significantly above target glycemic profile, he will likely need a higher dose of insulin.  However, he is not consistent taking his prescribed treatment.  I discussed and encouraged him to continue Tresiba 40 units nightly, Trulicity 1.5 mg subcutaneously weekly.  He is approached to monitor blood glucose at least twice a day-daily before breakfast and at bedtime .    -Patient is encouraged to call clinic for blood glucose levels less than 70 or above 200 mg /dl.    2) Lipids/HPL:   His recent lipid panel showed LDL worsening to 107.  He starts to benefit from continued statin intervention.  He is advised to continue Crestor 20 mg p.o. nightly.   His advised on side effects and precautions.    3) hypertension- -His blood pressure is controlled to target. -He is advised to continue on his torsemide as needed, carvedilol 25 mg p.o. twice daily.   4) weight management: His BMI is 29.7-a candidate for some weight loss.    He lost enough  weight recently.  He is advised to continue follow-up with his cardiologist.  - I advised patient to maintain close follow up with [name], MD for primary care needs.   I spent 41 minutes in the care of the patient today including review of labs from [place], Lipids, Thyroid Function, Hematology (current and previous including abstractions from other facilities); face-to-face time discussing  his blood glucose readings/logs, discussing hypoglycemia and hyperglycemia episodes and symptoms, medications doses, his options of short and long term treatment based on the latest standards of care / guidelines;  discussion about incorporating lifestyle medicine;  and documenting the encounter.    Please refer to Patient Instructions for Blood Glucose Monitoring and Insulin/Medications Dosing Guide"  in media tab for additional information. Please  also refer to " Patient Self Inventory" in the Media  tab for reviewed elements of pertinent patient history.  John [name] participated in the discussions, expressed understanding, and voiced agreement with the above plans.  All questions were answered to his satisfaction. he is encouraged to contact  clinic should he have any questions or concerns prior to his return visit.   Follow up plan: - Return in about 3 months (around 05/04/2021) for Bring Meter and Logs- A1c in Office.  Glade Lloyd, MD [facility] [address] [place], [place] [address] Phone: [phone]  Fax: [phone]    02/01/2021, 6:01 PM  This note was partially dictated with voice recognition software. Similar sounding words can be transcribed inadequately or may not  be corrected upon review.

## 2021-02-01 NOTE — Patient Instructions (Signed)

## 2021-02-01 NOTE — Telephone Encounter (Signed)
LeighAnn Demarr Kluever, CMA  

## 2021-02-02 ENCOUNTER — Encounter (INDEPENDENT_AMBULATORY_CARE_PROVIDER_SITE_OTHER): Payer: Self-pay

## 2021-02-03 ENCOUNTER — Telehealth: Payer: Self-pay

## 2021-02-06 ENCOUNTER — Other Ambulatory Visit: Payer: Self-pay

## 2021-02-06 ENCOUNTER — Encounter (INDEPENDENT_AMBULATORY_CARE_PROVIDER_SITE_OTHER): Payer: Self-pay

## 2021-02-06 ENCOUNTER — Telehealth (INDEPENDENT_AMBULATORY_CARE_PROVIDER_SITE_OTHER): Payer: Self-pay

## 2021-02-06 ENCOUNTER — Inpatient Hospital Stay (HOSPITAL_COMMUNITY)
Admission: EM | Admit: 2021-02-06 | Discharge: 2021-02-12 | DRG: 377 | Disposition: A | Discharge status: Home Health | Payer: Medicare HMO | Attending: Family Medicine | Admitting: Family Medicine

## 2021-02-06 ENCOUNTER — Encounter (HOSPITAL_COMMUNITY): Payer: Self-pay | Admitting: *Deleted

## 2021-02-06 DIAGNOSIS — E1165 Type 2 diabetes mellitus with hyperglycemia: Secondary | ICD-10-CM | POA: Diagnosis present

## 2021-02-06 DIAGNOSIS — K922 Gastrointestinal hemorrhage, unspecified: Secondary | ICD-10-CM

## 2021-02-06 DIAGNOSIS — K3189 Other diseases of stomach and duodenum: Secondary | ICD-10-CM | POA: Diagnosis present

## 2021-02-06 DIAGNOSIS — E861 Hypovolemia: Secondary | ICD-10-CM | POA: Diagnosis present

## 2021-02-06 DIAGNOSIS — D696 Thrombocytopenia, unspecified: Secondary | ICD-10-CM | POA: Diagnosis not present

## 2021-02-06 DIAGNOSIS — D649 Anemia, unspecified: Secondary | ICD-10-CM | POA: Diagnosis not present

## 2021-02-06 DIAGNOSIS — E871 Hypo-osmolality and hyponatremia: Secondary | ICD-10-CM | POA: Diagnosis not present

## 2021-02-06 DIAGNOSIS — E1159 Type 2 diabetes mellitus with other circulatory complications: Secondary | ICD-10-CM | POA: Diagnosis present

## 2021-02-06 DIAGNOSIS — K921 Melena: Secondary | ICD-10-CM | POA: Diagnosis present

## 2021-02-06 DIAGNOSIS — I10 Essential (primary) hypertension: Secondary | ICD-10-CM | POA: Diagnosis not present

## 2021-02-06 DIAGNOSIS — Z66 Do not resuscitate: Secondary | ICD-10-CM | POA: Diagnosis not present

## 2021-02-06 DIAGNOSIS — K449 Diaphragmatic hernia without obstruction or gangrene: Secondary | ICD-10-CM | POA: Diagnosis present

## 2021-02-06 DIAGNOSIS — K644 Residual hemorrhoidal skin tags: Secondary | ICD-10-CM | POA: Diagnosis present

## 2021-02-06 DIAGNOSIS — N179 Acute kidney failure, unspecified: Secondary | ICD-10-CM | POA: Diagnosis present

## 2021-02-06 DIAGNOSIS — I480 Paroxysmal atrial fibrillation: Secondary | ICD-10-CM | POA: Diagnosis present

## 2021-02-06 DIAGNOSIS — E782 Mixed hyperlipidemia: Secondary | ICD-10-CM | POA: Diagnosis present

## 2021-02-06 DIAGNOSIS — I4821 Permanent atrial fibrillation: Secondary | ICD-10-CM | POA: Diagnosis present

## 2021-02-06 DIAGNOSIS — I350 Nonrheumatic aortic (valve) stenosis: Secondary | ICD-10-CM | POA: Diagnosis present

## 2021-02-06 DIAGNOSIS — K7460 Unspecified cirrhosis of liver: Secondary | ICD-10-CM | POA: Diagnosis not present

## 2021-02-06 DIAGNOSIS — I5020 Unspecified systolic (congestive) heart failure: Secondary | ICD-10-CM | POA: Diagnosis not present

## 2021-02-06 DIAGNOSIS — I5043 Acute on chronic combined systolic (congestive) and diastolic (congestive) heart failure: Secondary | ICD-10-CM | POA: Diagnosis not present

## 2021-02-06 DIAGNOSIS — N1832 Chronic kidney disease, stage 3b: Secondary | ICD-10-CM | POA: Diagnosis present

## 2021-02-06 DIAGNOSIS — Z950 Presence of cardiac pacemaker: Secondary | ICD-10-CM | POA: Diagnosis not present

## 2021-02-06 DIAGNOSIS — I442 Atrioventricular block, complete: Secondary | ICD-10-CM | POA: Diagnosis not present

## 2021-02-06 DIAGNOSIS — I484 Atypical atrial flutter: Secondary | ICD-10-CM | POA: Diagnosis not present

## 2021-02-06 DIAGNOSIS — D62 Acute posthemorrhagic anemia: Secondary | ICD-10-CM | POA: Diagnosis not present

## 2021-02-06 DIAGNOSIS — I959 Hypotension, unspecified: Secondary | ICD-10-CM | POA: Diagnosis not present

## 2021-02-06 DIAGNOSIS — K222 Esophageal obstruction: Secondary | ICD-10-CM | POA: Diagnosis present

## 2021-02-06 DIAGNOSIS — E1122 Type 2 diabetes mellitus with diabetic chronic kidney disease: Secondary | ICD-10-CM | POA: Diagnosis present

## 2021-02-06 DIAGNOSIS — T189XXA Foreign body of alimentary tract, part unspecified, initial encounter: Secondary | ICD-10-CM

## 2021-02-06 DIAGNOSIS — I4892 Unspecified atrial flutter: Secondary | ICD-10-CM | POA: Diagnosis present

## 2021-02-06 DIAGNOSIS — Z952 Presence of prosthetic heart valve: Secondary | ICD-10-CM

## 2021-02-06 DIAGNOSIS — K5733 Diverticulitis of large intestine without perforation or abscess with bleeding: Secondary | ICD-10-CM | POA: Diagnosis present

## 2021-02-06 DIAGNOSIS — K920 Hematemesis: Secondary | ICD-10-CM | POA: Diagnosis not present

## 2021-02-06 DIAGNOSIS — Z794 Long term (current) use of insulin: Secondary | ICD-10-CM

## 2021-02-06 DIAGNOSIS — K766 Portal hypertension: Secondary | ICD-10-CM | POA: Diagnosis not present

## 2021-02-06 DIAGNOSIS — E875 Hyperkalemia: Secondary | ICD-10-CM | POA: Diagnosis present

## 2021-02-06 DIAGNOSIS — K6389 Other specified diseases of intestine: Secondary | ICD-10-CM | POA: Diagnosis not present

## 2021-02-06 DIAGNOSIS — K7581 Nonalcoholic steatohepatitis (NASH): Secondary | ICD-10-CM | POA: Diagnosis present

## 2021-02-06 DIAGNOSIS — Z20822 Contact with and (suspected) exposure to covid-19: Secondary | ICD-10-CM | POA: Diagnosis present

## 2021-02-06 DIAGNOSIS — I472 Ventricular tachycardia, unspecified: Secondary | ICD-10-CM | POA: Diagnosis present

## 2021-02-06 DIAGNOSIS — K219 Gastro-esophageal reflux disease without esophagitis: Secondary | ICD-10-CM | POA: Diagnosis present

## 2021-02-06 DIAGNOSIS — I428 Other cardiomyopathies: Secondary | ICD-10-CM | POA: Diagnosis present

## 2021-02-06 DIAGNOSIS — Z7982 Long term (current) use of aspirin: Secondary | ICD-10-CM

## 2021-02-06 DIAGNOSIS — Z87891 Personal history of nicotine dependence: Secondary | ICD-10-CM

## 2021-02-06 DIAGNOSIS — D500 Iron deficiency anemia secondary to blood loss (chronic): Secondary | ICD-10-CM | POA: Diagnosis not present

## 2021-02-06 DIAGNOSIS — R109 Unspecified abdominal pain: Secondary | ICD-10-CM | POA: Diagnosis not present

## 2021-02-06 DIAGNOSIS — K5732 Diverticulitis of large intestine without perforation or abscess without bleeding: Secondary | ICD-10-CM | POA: Diagnosis present

## 2021-02-06 DIAGNOSIS — Z8249 Family history of ischemic heart disease and other diseases of the circulatory system: Secondary | ICD-10-CM

## 2021-02-06 DIAGNOSIS — N183 Chronic kidney disease, stage 3 unspecified: Secondary | ICD-10-CM | POA: Diagnosis present

## 2021-02-06 DIAGNOSIS — K264 Chronic or unspecified duodenal ulcer with hemorrhage: Principal | ICD-10-CM | POA: Diagnosis present

## 2021-02-06 DIAGNOSIS — L299 Pruritus, unspecified: Secondary | ICD-10-CM | POA: Diagnosis present

## 2021-02-06 DIAGNOSIS — D126 Benign neoplasm of colon, unspecified: Secondary | ICD-10-CM | POA: Diagnosis present

## 2021-02-06 DIAGNOSIS — I130 Hypertensive heart and chronic kidney disease with heart failure and stage 1 through stage 4 chronic kidney disease, or unspecified chronic kidney disease: Secondary | ICD-10-CM | POA: Diagnosis not present

## 2021-02-06 DIAGNOSIS — M1990 Unspecified osteoarthritis, unspecified site: Secondary | ICD-10-CM | POA: Diagnosis present

## 2021-02-06 DIAGNOSIS — R001 Bradycardia, unspecified: Secondary | ICD-10-CM | POA: Diagnosis not present

## 2021-02-06 LAB — APTT: aPTT: 35 seconds (ref 24–36)

## 2021-02-06 LAB — CBC WITH DIFFERENTIAL/PLATELET
Abs Immature Granulocytes: 0.04 10*3/uL (ref 0.00–0.07)
Basophils Absolute: 0.1 10*3/uL (ref 0.0–0.1)
Basophils Relative: 1 %
Eosinophils Absolute: 0.2 10*3/uL (ref 0.0–0.5)
Eosinophils Relative: 3 %
HCT: 21.6 % — ABNORMAL LOW (ref 39.0–52.0)
Hemoglobin: 6.4 g/dL — CL (ref 13.0–17.0)
Immature Granulocytes: 1 %
Lymphocytes Relative: 13 %
Lymphs Abs: 1 10*3/uL (ref 0.7–4.0)
MCH: 25.8 pg — ABNORMAL LOW (ref 26.0–34.0)
MCHC: 29.6 g/dL — ABNORMAL LOW (ref 30.0–36.0)
MCV: 87.1 fL (ref 80.0–100.0)
Monocytes Absolute: 1 10*3/uL (ref 0.1–1.0)
Monocytes Relative: 12 %
Neutro Abs: 5.7 10*3/uL (ref 1.7–7.7)
Neutrophils Relative %: 70 %
Platelets: 176 10*3/uL (ref 150–400)
RBC: 2.48 MIL/uL — ABNORMAL LOW (ref 4.22–5.81)
RDW: 22.6 % — ABNORMAL HIGH (ref 11.5–15.5)
WBC: 8.1 10*3/uL (ref 4.0–10.5)
nRBC: 0 % (ref 0.0–0.2)

## 2021-02-06 LAB — COMPREHENSIVE METABOLIC PANEL
ALT: 16 U/L (ref 0–44)
AST: 27 U/L (ref 15–41)
Albumin: 2.6 g/dL — ABNORMAL LOW (ref 3.5–5.0)
Alkaline Phosphatase: 176 U/L — ABNORMAL HIGH (ref 38–126)
Anion gap: 8 (ref 5–15)
BUN: 98 mg/dL — ABNORMAL HIGH (ref 8–23)
CO2: 21 mmol/L — ABNORMAL LOW (ref 22–32)
Calcium: 7.9 mg/dL — ABNORMAL LOW (ref 8.9–10.3)
Chloride: 97 mmol/L — ABNORMAL LOW (ref 98–111)
Creatinine, Ser: 2.24 mg/dL — ABNORMAL HIGH (ref 0.61–1.24)
GFR, Estimated: 30 mL/min — ABNORMAL LOW (ref >60)
Glucose, Bld: 276 mg/dL — ABNORMAL HIGH (ref 70–99)
Potassium: 5.3 mmol/L — ABNORMAL HIGH (ref 3.5–5.1)
Sodium: 126 mmol/L — ABNORMAL LOW (ref 135–145)
Total Bilirubin: 2.3 mg/dL — ABNORMAL HIGH (ref 0.3–1.2)
Total Protein: 6.1 g/dL — ABNORMAL LOW (ref 6.5–8.1)

## 2021-02-06 LAB — RESP PANEL BY RT-PCR (FLU A&B, COVID) ARPGX2
Influenza A by PCR: NEGATIVE
Influenza B by PCR: NEGATIVE
SARS Coronavirus 2 by RT PCR: NEGATIVE

## 2021-02-06 LAB — PROTIME-INR
INR: 1.7 — ABNORMAL HIGH (ref 0.8–1.2)
Prothrombin Time: 20.2 seconds — ABNORMAL HIGH (ref 11.4–15.2)

## 2021-02-06 LAB — PREPARE RBC (CROSSMATCH)

## 2021-02-06 LAB — POC OCCULT BLOOD, ED: Fecal Occult Bld: POSITIVE — AB

## 2021-02-06 LAB — MRSA NEXT GEN BY PCR, NASAL: MRSA by PCR Next Gen: NOT DETECTED

## 2021-02-06 LAB — BRAIN NATRIURETIC PEPTIDE: B Natriuretic Peptide: 1516 pg/mL — ABNORMAL HIGH (ref 0.0–100.0)

## 2021-02-06 LAB — GLUCOSE, CAPILLARY
Glucose-Capillary: 264 mg/dL — ABNORMAL HIGH (ref 70–99)
Glucose-Capillary: 312 mg/dL — ABNORMAL HIGH (ref 70–99)

## 2021-02-06 LAB — CBG MONITORING, ED: Glucose-Capillary: 257 mg/dL — ABNORMAL HIGH (ref 70–99)

## 2021-02-06 MED ORDER — INSULIN ASPART 100 UNIT/ML IJ SOLN
0.0000 [IU] | Freq: Three times a day (TID) | INTRAMUSCULAR | Status: DC
  Administered 2021-02-06: 5 [IU] via SUBCUTANEOUS
  Administered 2021-02-07 – 2021-02-08 (×3): 2 [IU] via SUBCUTANEOUS
  Administered 2021-02-08 – 2021-02-10 (×2): 1 [IU] via SUBCUTANEOUS

## 2021-02-06 MED ORDER — INSULIN GLARGINE-YFGN 100 UNIT/ML ~~LOC~~ SOLN
40.0000 [IU] | Freq: Every day | SUBCUTANEOUS | Status: DC
  Administered 2021-02-06 – 2021-02-10 (×5): 40 [IU] via SUBCUTANEOUS
  Filled 2021-02-06 (×7): qty 0.4

## 2021-02-06 MED ORDER — ONDANSETRON HCL 4 MG/2ML IJ SOLN
4.0000 mg | Freq: Four times a day (QID) | INTRAMUSCULAR | Status: DC | PRN
  Administered 2021-02-07: 4 mg via INTRAVENOUS
  Filled 2021-02-06: qty 2

## 2021-02-06 MED ORDER — METRONIDAZOLE 500 MG PO TABS: 500.0000 mg | ORAL_TABLET | Freq: Two times a day (BID) | ORAL | Status: DC

## 2021-02-06 MED ORDER — CAMPHOR-MENTHOL 0.5-0.5 % EX LOTN
1.0000 "application " | TOPICAL_LOTION | Freq: Every day | CUTANEOUS | Status: DC | PRN
  Filled 2021-02-06: qty 222

## 2021-02-06 MED ORDER — SODIUM CHLORIDE 0.9 % IV SOLN
2.0000 g | INTRAVENOUS | Status: DC
  Administered 2021-02-06 – 2021-02-11 (×6): 2 g via INTRAVENOUS
  Filled 2021-02-06 (×6): qty 20

## 2021-02-06 MED ORDER — CALCIUM GLUCONATE-NACL 1-0.675 GM/50ML-% IV SOLN
1.0000 g | Freq: Once | INTRAVENOUS | Status: AC
  Administered 2021-02-06: 1000 mg via INTRAVENOUS
  Filled 2021-02-06: qty 50

## 2021-02-06 MED ORDER — SODIUM CHLORIDE 0.9% FLUSH
3.0000 mL | Freq: Two times a day (BID) | INTRAVENOUS | Status: DC
  Administered 2021-02-06 – 2021-02-11 (×11): 3 mL via INTRAVENOUS

## 2021-02-06 MED ORDER — ONDANSETRON HCL 4 MG PO TABS: 4.0000 mg | ORAL_TABLET | Freq: Four times a day (QID) | ORAL | Status: DC | PRN

## 2021-02-06 MED ORDER — SUCRALFATE 1 G PO TABS
1.0000 g | ORAL_TABLET | Freq: Four times a day (QID) | ORAL | Status: DC
  Administered 2021-02-06 – 2021-02-12 (×21): 1 g via ORAL
  Filled 2021-02-06 (×21): qty 1

## 2021-02-06 MED ORDER — IPRATROPIUM BROMIDE 0.02 % IN SOLN: 0.5000 mg | Freq: Four times a day (QID) | RESPIRATORY_TRACT | Status: DC | PRN

## 2021-02-06 MED ORDER — HEPARIN SODIUM (PORCINE) 5000 UNIT/ML IJ SOLN: 5000.0000 [IU] | Freq: Three times a day (TID) | INTRAMUSCULAR | Status: DC

## 2021-02-06 MED ORDER — ACETAMINOPHEN 325 MG PO TABS
650.0000 mg | ORAL_TABLET | Freq: Four times a day (QID) | ORAL | Status: DC | PRN
  Administered 2021-02-07: 650 mg via ORAL
  Filled 2021-02-06: qty 2

## 2021-02-06 MED ORDER — TORSEMIDE 20 MG PO TABS
20.0000 mg | ORAL_TABLET | Freq: Two times a day (BID) | ORAL | Status: DC
  Administered 2021-02-07 – 2021-02-08 (×3): 20 mg via ORAL
  Filled 2021-02-06 (×3): qty 1

## 2021-02-06 MED ORDER — SODIUM CHLORIDE 0.9 % IV SOLN: INTRAVENOUS | Status: DC

## 2021-02-06 MED ORDER — HYDRALAZINE HCL 20 MG/ML IJ SOLN: 10.0000 mg | INTRAMUSCULAR | Status: DC | PRN

## 2021-02-06 MED ORDER — IRON DEXTRAN 50 MG/ML IJ SOLN: 300.0000 mg | Freq: Once | INTRAMUSCULAR | Status: DC

## 2021-02-06 MED ORDER — ACETAMINOPHEN 650 MG RE SUPP: 650.0000 mg | Freq: Four times a day (QID) | RECTAL | Status: DC | PRN

## 2021-02-06 MED ORDER — SODIUM CHLORIDE 0.9 % IV SOLN
300.0000 mg | Freq: Once | INTRAVENOUS | Status: AC
  Administered 2021-02-06: 300 mg via INTRAVENOUS
  Filled 2021-02-06: qty 15

## 2021-02-06 MED ORDER — CEFDINIR 300 MG PO CAPS: 300.0000 mg | ORAL_CAPSULE | Freq: Two times a day (BID) | ORAL | Status: DC

## 2021-02-06 MED ORDER — SODIUM CHLORIDE 0.9 % IV SOLN: 250.0000 mL | INTRAVENOUS | Status: DC | PRN

## 2021-02-06 MED ORDER — CARVEDILOL 3.125 MG PO TABS
3.1250 mg | ORAL_TABLET | Freq: Two times a day (BID) | ORAL | Status: DC
  Administered 2021-02-06 – 2021-02-12 (×10): 3.125 mg via ORAL
  Filled 2021-02-06 (×11): qty 1

## 2021-02-06 MED ORDER — PANTOPRAZOLE SODIUM 40 MG IV SOLR
40.0000 mg | Freq: Two times a day (BID) | INTRAVENOUS | Status: DC
  Administered 2021-02-06 – 2021-02-12 (×11): 40 mg via INTRAVENOUS
  Filled 2021-02-06 (×11): qty 40

## 2021-02-06 MED ORDER — SODIUM CHLORIDE 0.9% FLUSH
3.0000 mL | Freq: Two times a day (BID) | INTRAVENOUS | Status: DC
  Administered 2021-02-06 – 2021-02-11 (×9): 3 mL via INTRAVENOUS

## 2021-02-06 MED ORDER — TRAZODONE HCL 50 MG PO TABS
25.0000 mg | ORAL_TABLET | Freq: Every evening | ORAL | Status: DC | PRN
  Administered 2021-02-06 – 2021-02-10 (×3): 25 mg via ORAL
  Filled 2021-02-06 (×3): qty 1

## 2021-02-06 MED ORDER — PANTOPRAZOLE SODIUM 40 MG IV SOLR
80.0000 mg | Freq: Once | INTRAVENOUS | Status: AC
  Administered 2021-02-06: 80 mg via INTRAVENOUS
  Filled 2021-02-06: qty 80

## 2021-02-06 MED ORDER — FERROUS SULFATE 325 (65 FE) MG PO TABS: 325.0000 mg | ORAL_TABLET | Freq: Every day | ORAL | Status: DC

## 2021-02-06 MED ORDER — BISACODYL 5 MG PO TBEC: 5.0000 mg | DELAYED_RELEASE_TABLET | Freq: Every day | ORAL | Status: DC | PRN

## 2021-02-06 MED ORDER — OXYCODONE HCL 5 MG PO TABS: 5.0000 mg | ORAL_TABLET | ORAL | Status: DC | PRN

## 2021-02-06 MED ORDER — SENNOSIDES-DOCUSATE SODIUM 8.6-50 MG PO TABS: 1.0000 | ORAL_TABLET | Freq: Every evening | ORAL | Status: DC | PRN

## 2021-02-06 MED ORDER — SODIUM CHLORIDE 0.9% IV SOLUTION: Freq: Once | INTRAVENOUS | Status: AC

## 2021-02-06 MED ORDER — LINACLOTIDE 145 MCG PO CAPS
145.0000 ug | ORAL_CAPSULE | Freq: Every day | ORAL | Status: DC
  Administered 2021-02-07 – 2021-02-12 (×6): 145 ug via ORAL
  Filled 2021-02-06 (×6): qty 1

## 2021-02-06 MED ORDER — SODIUM CHLORIDE 0.9% FLUSH: 3.0000 mL | INTRAVENOUS | Status: DC | PRN

## 2021-02-06 MED ORDER — HYDROMORPHONE HCL 1 MG/ML IJ SOLN: 0.5000 mg | INTRAMUSCULAR | Status: DC | PRN

## 2021-02-06 MED ORDER — METOLAZONE 5 MG PO TABS
2.5000 mg | ORAL_TABLET | ORAL | Status: DC
  Filled 2021-02-06: qty 1

## 2021-02-06 MED ORDER — LEVALBUTEROL HCL 0.63 MG/3ML IN NEBU: 0.6300 mg | INHALATION_SOLUTION | Freq: Four times a day (QID) | RESPIRATORY_TRACT | Status: DC | PRN

## 2021-02-06 NOTE — H&P (Signed)
History and Physical   Patient: John [name]                            PCP: [name], MD                    DOB: [birth year]            DOA: 02/06/2021 FRT:[id number]             DOS: 02/06/2021, 2:44 PM  [name], MD  Patient coming from:   HOME  I have personally reviewed patient's medical records, in electronic medical records, including:  [facility], and care everywhere.    Chief Complaint:   No chief complaint on file.   History of present illness:    John [name] is a 75 y.o. male with medical history significant of systolic and diastolic CHF, DM 2, HTN, NASH cirrhosis, CKD stage IIIb, heart block status post PPM, HLD, persistent A. fib, off chronic anticoagulation due to GI bleed, upper GI bleed-ulcer, diverticulitis Presented today with chief complaint of abdominal, black stool, 4 episodes this morning.  Some shortness of breath, when laying down, edema bilateral lower extremity. On Zaroxolyn q. Mondays and torsemide twice a day.  Recent hospitalization from 11/3/ - 11/6  for GI bleed, diagnosed with diverticulitis, duodenal ulcer   Patient Denies having: Fever, Chills, Cough, SOB, Chest Pain, Abd pain, N/V/D, headache, dizziness, lightheadedness,  Dysuria, Joint pain, rash, open wounds  ED Course:   Vitals:   02/06/21 1235 02/06/21 1413  BP: (!) 108/53 (!) 104/49  Pulse: (!) 59 64  Resp: 18 18  Temp: 97.6 F (36.4 C)   SpO2: 100% 100%   Abnormal labs;     Review of Systems: As per HPI, otherwise 10 point review of systems were negative.   ----------------------------------------------------------------------------------------------------------------------  No Known Allergies  Home MEDs:  Prior to Admission medications   Medication Sig Start Date End Date Taking? Authorizing Provider  ACCU-CHEK AVIVA PLUS test strip TEST BLOOD SUGAR TWICE DAILY BEFORE BREAKFAST AND AT BEDTIME 07/19/20   Cassandria Anger, MD  acetaminophen  (TYLENOL) 325 MG tablet Take 2 tablets (650 mg total) by mouth every 4 (four) hours as needed for headache or mild pain. 05/15/20   Isaiah Serge, NP  allopurinol (ZYLOPRIM) 300 MG tablet Take 150 mg by mouth as needed.    [provider]  aspirin EC 81 MG tablet Take 1 tablet (81 mg total) by mouth daily with breakfast. 10/25/20 10/25/21  Roxan Hockey, MD  benzonatate (TESSALON) 100 MG capsule Take 1 capsule (100 mg total) by mouth 3 (three) times daily as needed for cough. 09/27/20   Evans Lance, MD  camphor-menthol (ANTI-ITCH) lotion Apply 1 application topically daily as needed for itching.    [provider]  carvedilol (COREG) 3.125 MG tablet Take 1 tablet (3.125 mg total) by mouth 2 (two) times daily with a meal. 01/30/21   Milford, Maricela Bo, FNP  cefdinir (OMNICEF) 300 MG capsule Take 1 capsule (300 mg total) by mouth every 12 (twelve) hours. 01/29/21   Orson Eva, MD  colchicine 0.6 MG tablet Take 1 tablet (0.6 mg total) by mouth daily as needed. 05/15/20   Isaiah Serge, NP  Dulaglutide (TRULICITY) 1.5 PO/1.4DC SOPN INJECT 1.5MG (1 PEN) SUBCUTANEOUSLY EVERY WEEK 10/05/20   Cassandria Anger, MD  ferrous sulfate 325 (65 FE) MG tablet Take  1 tablet (325 mg total) by mouth daily with breakfast. 01/29/21   Tat, Shanon Brow, MD  hydrocortisone cream 1 % Apply 1 application topically daily.    [provider]  insulin degludec (TRESIBA FLEXTOUCH) 100 UNIT/ML FlexTouch Pen Inject 40 Units into the skin at bedtime. 11/01/20   Cassandria Anger, MD  Insulin Pen Needle (BD PEN NEEDLE NANO U/F) 32G X 4 MM MISC 1 each by Does not apply route 4 (four) times daily. 12/23/18   Cassandria Anger, MD  linaclotide Rolan Lipa) 145 MCG CAPS capsule Take 1 capsule (145 mcg total) by mouth daily before breakfast. 01/26/21   Montez Morita, Quillian Quince, MD  metolazone (ZAROXOLYN) 2.5 MG tablet Take 1 tablet (2.5 mg total) by mouth once a week. Every Monday 01/30/21   Rafael Bihari, FNP   metroNIDAZOLE (FLAGYL) 500 MG tablet Take 1 tablet (500 mg total) by mouth every 12 (twelve) hours. 01/29/21   Orson Eva, MD  Multiple Vitamins-Minerals (ONE-A-DAY MENS 50+) TABS Take 1 tablet by mouth daily with breakfast. Men    [provider]  pantoprazole (PROTONIX) 40 MG tablet Take 1 tablet (40 mg total) by mouth 2 (two) times daily. 01/29/21   Orson Eva, MD  polyethylene glycol-electrolytes (TRILYTE) 420 g solution Take 4,000 mLs by mouth as directed. 02/01/21   Harvel Quale, MD  potassium chloride SA (KLOR-CON M20) 20 MEQ tablet Take 1.5 tablets (30 mEq total) by mouth 2 (two) times daily. Take 2 extra tabs on Mon with metolazone 12/12/20   Bensimhon, Shaune Pascal, MD  spironolactone (ALDACTONE) 25 MG tablet Take 0.5 tablets (12.5 mg total) by mouth daily. 07/20/20   Bensimhon, Shaune Pascal, MD  sucralfate (CARAFATE) 1 g tablet Take 1 tablet (1 g total) by mouth 4 (four) times daily. 10/22/20 10/22/21  Roxan Hockey, MD  torsemide (DEMADEX) 20 MG tablet TAKE 3 TABLETS (60 MG TOTAL) BY MOUTH 2 (TWO) TIMES DAILY. Patient taking differently: Take 20 mg by mouth 2 (two) times daily. 11/01/20   Bensimhon, Shaune Pascal, MD    PRN MEDs: sodium chloride, acetaminophen **OR** acetaminophen, bisacodyl, camphor-menthol, hydrALAZINE, HYDROmorphone (DILAUDID) injection, ipratropium, levalbuterol, ondansetron **OR** ondansetron (ZOFRAN) IV, oxyCODONE, senna-docusate, sodium chloride flush, traZODone  Past Medical History:  Diagnosis Date   Atrial flutter ([place]) 12/2010   Admitted with symptomatic bradycardia (HR 40s), atrial flutter with slow ventricular response 12/2010 + volume overload; AV nodal agents d/c'd and Pradaxa started; RFA in 01/2011   Chronic kidney disease, stage 3b (HCC)    Chronic systolic CHF (congestive heart failure) (HCC)    Class 2 severe obesity due to excess calories with serious comorbidity and body mass index (BMI) of 37.0 to 37.9 in adult ([place]) 07/17/2017   Diabetes  mellitus    non insulin dependant   Hematoma, chest wall 05/15/2020   Hyperlipidemia    Hypertension    Mitral regurgitation    NASH (nonalcoholic steatohepatitis)    NICM (nonischemic cardiomyopathy) ([place])    Osteoarthritis    Polyarticular gout 05/15/2020   Presence of permanent cardiac pacemaker    PSVT (paroxysmal supraventricular tachycardia) (HCC)    Possibly atrial flutter   S/P TAVR (transcatheter aortic valve replacement) 05/03/2020   s/p TAVR with a 29 mm Edwards S3U via the subclavian approach with Dr. Angelena Form & Dr. Cyndia Bent    Severe aortic stenosis     Past Surgical History:  Procedure Laterality Date   ATRIAL FLUTTER ABLATION N/A 02/07/2011   Procedure: ATRIAL FLUTTER ABLATION;  Surgeon: Carleene Overlie  Peyton Najjar, MD;  Location: [facility] CATH LAB;  Service: Cardiovascular;  Laterality: N/A;   BIV UPGRADE N/A 06/08/2020   Procedure: BIV PPM UPGRADE;  Surgeon: Evans Lance, MD;  Location: [place] CV LAB;  Service: Cardiovascular;  Laterality: N/A;   CARDIAC ELECTROPHYSIOLOGY STUDY AND ABLATION  02/07/2011   CARDIOVERSION N/A 03/23/2016   Procedure: CARDIOVERSION;  Surgeon: Evans Lance, MD;  Location: [place];  Service: Cardiovascular;  Laterality: N/A;   COLONOSCOPY N/A 04/17/2017   Rehman: 3 polyps in [facility], diverticulosis   EP IMPLANTABLE DEVICE N/A 08/29/2015   Procedure: Pacemaker Implant;  Surgeon: Evans Lance, MD;  Location: [place] CV LAB;  Service: Cardiovascular;  Laterality: N/A;   EP IMPLANTABLE DEVICE N/A 12/07/2015   Procedure: PPM Lead Revision/Repair;  Surgeon: Evans Lance, MD;  Location: [place] CV LAB;  Service: Cardiovascular;  Laterality: N/A;   ESOPHAGOGASTRODUODENOSCOPY (EGD) WITH PROPOFOL N/A 10/18/2020   Castaneda: normal esophagus, erythematous mucosa in gastric body, non bleeding duodenal ulcer with a non bleeding visible vessel (forrest Class IIa). Injected, clip placed, no specimens collected.   ESOPHAGOGASTRODUODENOSCOPY (EGD) WITH  PROPOFOL N/A 01/27/2021   Procedure: ESOPHAGOGASTRODUODENOSCOPY (EGD) WITH PROPOFOL;  Surgeon: [name], DO;  [facility];  Service: Endoscopy;  Laterality: N/A;   IR ANGIOGRAM SELECTIVE EACH ADDITIONAL VESSEL  10/19/2020   IR ANGIOGRAM SELECTIVE EACH ADDITIONAL VESSEL  10/19/2020   IR ANGIOGRAM VISCERAL SELECTIVE  10/19/2020   IR ANGIOGRAM VISCERAL SELECTIVE  10/19/2020   IR EMBO ART  VEN HEMORR LYMPH EXTRAV  INC GUIDE ROADMAPPING  10/19/2020   IR US GUIDE VASC ACCESS RIGHT  10/19/2020   KNEE ARTHROSCOPY  03/27/2003   left   LUMBAR SPINE SURGERY     "I've had 6 ORs 1972 thru 2004"   POLYPECTOMY  04/17/2017   Procedure: POLYPECTOMY;  Surgeon: Rogene Houston, MD;  [facility];  Service: Endoscopy;;  transverse colon x3;   RIGHT HEART CATH N/A 04/28/2020   Procedure: RIGHT HEART CATH;  Surgeon: Jolaine Artist, MD;  Location: [place] CV LAB;  Service: Cardiovascular;  Laterality: N/A;   RIGHT/LEFT HEART CATH AND CORONARY ANGIOGRAPHY N/A 12/21/2019   Procedure: RIGHT/LEFT HEART CATH AND CORONARY ANGIOGRAPHY;  Surgeon: Nelva Bush, MD;  Location: [place] CV LAB;  Service: Cardiovascular;  Laterality: N/A;   TEE WITHOUT CARDIOVERSION N/A 11/05/2019   Procedure: TRANSESOPHAGEAL ECHOCARDIOGRAM (TEE) WITH PROPOFOL;  Surgeon: Arnoldo Lenis, MD;  [facility];  Service: Endoscopy;  Laterality: N/A;   TEE WITHOUT CARDIOVERSION N/A 05/03/2020   Procedure: TRANSESOPHAGEAL ECHOCARDIOGRAM (TEE);  Surgeon: Burnell Blanks, MD;  Location: [place];  Service: Open Heart Surgery;  Laterality: N/A;     reports that he quit smoking about 34 years ago. His smoking use included cigarettes. He has a 10.00 pack-year smoking history. He has never used smokeless tobacco. He reports that he does not currently use alcohol. He reports that he does not use drugs.   Family History  Problem Relation Age of Onset   Heart attack Mother     Hypertension Mother    Heart attack Father    Hypertension Father    Heart attack Brother    Colon cancer Neg Hx     Physical Exam:   Vitals:   02/06/21 1235 02/06/21 1413  BP: (!) 108/53 (!) 104/49  Pulse: (!) 59 64  Resp: 18 18  Temp: 97.6 F (36.4 C)   TempSrc: Oral  SpO2: 100% 100%   Constitutional: NAD, calm, comfortable Eyes: PERRL, lids and conjunctivae normal ENMT: Mucous membranes are moist. Posterior pharynx clear of any exudate or lesions.Normal dentition.  Neck: normal, supple, no masses, no thyromegaly Respiratory: clear to auscultation bilaterally, no wheezing, no crackles. Normal respiratory effort. No accessory muscle use.  Cardiovascular: Regular rate and rhythm, no murmurs / rubs / gallops. No extremity edema. 2+ pedal pulses. No carotid bruits.  Abdomen: no tenderness, no masses palpated. No hepatosplenomegaly. Bowel sounds positive.  Musculoskeletal: no clubbing / cyanosis. No joint deformity upper and lower extremities. Good ROM, no contractures. Normal muscle tone.  Neurologic: CN II-XII grossly intact. Sensation intact, DTR normal. Strength 5/5 in all 4.  Psychiatric: Normal judgment and insight. Alert and oriented x 3. Normal mood.  Skin: no rashes, lesions, ulcers. No induration Decubitus/ulcers:  Wounds: per nursing documentation         Labs on admission:    I have personally reviewed following labs and imaging studies  CBC: Recent Labs  Lab 01/30/21 1538 02/06/21 1300  WBC 6.1 8.1  NEUTROABS  --  5.7  HGB 8.0* 6.4*  HCT 26.2* 21.6*  MCV 87.0 87.1  PLT 114* 621   Basic Metabolic Panel: Recent Labs  Lab 01/30/21 1538 02/06/21 1300  NA 135 126*  K 4.1 5.3*  CL 104 97*  CO2 22 21*  GLUCOSE 165* 276*  BUN 78* 98*  CREATININE 1.93* 2.24*  CALCIUM 8.3* 7.9*   GFR: Estimated Creatinine Clearance: 34.8 mL/min (A) (by C-G formula based on SCr of 2.24 mg/dL (H)). Liver Function Tests: Recent Labs  Lab 02/06/21 1300  AST 27   ALT 16  ALKPHOS 176*  BILITOT 2.3*  PROT 6.1*  ALBUMIN 2.6*   No results for input(s): LIPASE, AMYLASE in the last 168 hours. No results for input(s): AMMONIA in the last 168 hours. Coagulation Profile: Recent Labs  Lab 02/06/21 1300  INR 1.7*    Urine analysis:    Component Value Date/Time   COLORURINE STRAW (A) 01/26/2021 1328   APPEARANCEUR CLEAR 01/26/2021 1328   LABSPEC 1.008 01/26/2021 1328   PHURINE 6.0 01/26/2021 1328   GLUCOSEU NEGATIVE 01/26/2021 1328   HGBUR NEGATIVE 01/26/2021 1328   BILIRUBINUR NEGATIVE 01/26/2021 1328   KETONESUR NEGATIVE 01/26/2021 1328   PROTEINUR NEGATIVE 01/26/2021 1328   UROBILINOGEN 0.2 01/05/2014 0744   NITRITE NEGATIVE 01/26/2021 1328   LEUKOCYTESUR NEGATIVE 01/26/2021 1328     Radiologic Exams on Admission:   No results found.  EKG:   Independently reviewed.  Orders placed or performed during the hospital encounter of 02/06/21   ED EKG   ED EKG   EKG 12-Lead   EKG 12-Lead   EKG 12-Lead   ---------------------------------------------------------------------------------------------------------------------------------------    Assessment / Plan:   Principal Problem:   Acute GI bleeding Active Problems:   DM type 2 causing vascular disease ([place])   Mixed hyperlipidemia   Essential hypertension, benign   Atrial flutter (HCC)   CKD (chronic kidney disease) stage 3, GFR 30-59 ml/min (HCC)   Paroxysmal atrial fibrillation (HCC)   Hyponatremia   Acute on chronic combined systolic and diastolic CHF (congestive heart failure) (HCC)   Severe aortic stenosis   S/P TAVR (transcatheter aortic valve replacement)   Melena   Diverticulitis of colon   Principal Problem:    Acute GI bleeding -Hemoccult positive x 2  -Recent EGD revealing healing duodenal ulcer, diverticulitis on last admission -s/p embolization in July 2022 and surveillance EGD 01/27/21  with healed ulcer and portal gastropathy, history of diverticulitis  01/26/21 while inpatient, -Started on Protonix drip, 2 units PRBC ordered by ED for transfusion -Gastroenterologist consulted -appreciate further evaluation and recommendations  EGD 01/27/21: small hiatal hernia, non-obstructing Schatzki's ring, portal gastropathy, duodenal scar indicative of healed prior ulcer  -Pending CT abdomen ???   -NPO  Hyponatremia -Likely due to hypovolemia, -Last sodium 11 09/23/1933, today 126 >. -Posttransfusion once patient stable, starting gentle IV fluid hydration with normal saline  Hyperkalemia/hypocalcemia -Potassium 5.3, calcium 7.9, albumin 2.6 -Monitoring correcting and repleting accordingly   Active Problems:     Diagnosis of diverticulitis -on 01/27/2021 -Last admission was treated with Rocephin and Flagyl IV will switch to cefdinir metronidazole For 7 more days postdischarge,... Apparently completed 02/03/2021 -We will await CT scan, -Antibiotics for now    Acute blood loss anemia Due to recurrent GI bleed, Hemoccult positive -Baseline hemoglobin ~9 (hemoglobin 8.0 on 11 /7) -Hemoglobin 6.4 >>> 2U PRBC initiated in ED >>>   -likely due to "oozing" from patient's portal hypertensive gastropathy--see below -7/25 to 7/30 admit with UGI bleed -7/26 EGD--cratered duodenal ulcer with visible blood vessel status post epi injection and unsuccessful hemoclips.  The patient required IR embolization during that hospitalization.  -11/4 EGD-- nonobstructive schatzki ring; portal HTN gastropathy; 20 mm healed ulcer in duodenal bulb without stigmata of bleeding  -Gastroenterologist consulted, appreciate input   Acute on chronic systolic and diastolic CHF -Patient appears hypovolemic on arrival, transfusing 2U PRBC, Monitoring BUN/creatinine and volume status monitoring I's and O's  -Restarting home medication of torsemide 20 mg p.o. twice daily in a.m. -10/7-10/10 admit for acute on chronic combined CHF--discharge weight 94.8 kg -01/26/2021  admission weight 97.5 kg -Spouse states that patient has gained about 5 pounds in the past 2 weeks -Start IV furosemide>>restart dose torsemide -Daily weights -11/05/2019 TEE EF 35-40%, mild decreased RV function, moderate TR/MR, global HK -01/01/2021 echo EF 30-35%, global HK, low normal RV, mod to moderate MR, moderate TR     CKD stage IIIb -Baseline creatinine 1.8-2.0 -Creatinine on last discharge 2.01, today 2.24 -Avoiding nephrotoxin, -We will monitor BUN/creatinine   Persistent atrial fibrillation -Patient previously on anticoagulation but this has been discontinued secondary to GI bleed and cratered duodenal ulcer with visible vessel -Controlled on    Diabetes mellitus type 2, uncontrolled with hyperglycemia -We will check CBG q. ACH S, with slight scale insulin -Holding Trulicity and Tresiba -87/08/8113 hemoglobin A1c 9.2   Complete heart block -Cardiac meds, status post pacemaker placement   Hyperlipidemia -[place]   S/p TAVR -04/2020 -Stable   Goals of Care -DNR--confirmed with spouse and patient   Cultures:  -none  Antimicrobial: -Omnicef Flagyl, home medication--was completed recently  Consults called: GI  -------------------------------------------------------------------------------------------------------------------------------------------- DVT prophylaxis: SCD/Compression stockings Code Status:   Code Status: Full Code   Admission status: Patient will be admitted as Inpatient, with a greater than 2 midnight length of stay. Level of care: Stepdown   Family Communication:  none at bedside  (The above findings and plan of care has been discussed with patient in detail, the patient expressed understanding and agreement of above plan)  --------------------------------------------------------------------------------------------------------------------------------------------------  Disposition Plan: >3 days Status is: Inpatient  Remains inpatient  appropriate because: Ongoing symptoms due to symptomatic anemia, requiring blood transfusions, IV fluids, due to acute on chronic kidney disease, electrolyte abnormalities, multiple call Reddys including CHF.   -----------------------------------------------------------------------------------------------------------------------------------------  Time spent: > than  41 Min.  Spent in admitting this patient, reviewing old extensive medical  records, drawing plan of care and discussing with consultants ED medical staff.   SIGNED: [name], MD, FHM. [facility],  Pager (Please use [email or web address] to page to text)  If 7PM-7AM, please contact night-coverage [email or web address],  02/06/2021, 2:44 PM

## 2021-02-06 NOTE — H&P (View-Only) (Signed)
Referring Provider: [facility] Primary Care Physician:  [name], MD Primary Gastroenterologist:  Dr. Jenetta Downer  Date of Admission: 02/06/21 Date of Consultation: 02/06/21  Reason for Consultation:  GI bleed  HPI:  John [name] is a 75 y.o. year old male with a history of NASH cirrhosis, duodenal ulcer s/p embolization in July 2022 and surveillance EGD 01/27/21 with healed ulcer and portal gastropathy, history of diverticulitis 01/26/21 while inpatient, IDA, returning with symptomatic anemia and reported melena. Prior to presentation today, plans had been for outpatient colonoscopy, which was arranged for next week.   Hgb 6.4 on admission, down from 8 last week. Hyponatremia with sodium 126, potassium 5.3, BUN 98, and creatinine worsened from baseline at 2.24, previously in the upper 1 range (1.93 a week ago). INR 1.7. 2 units PRBCs have been ordered.   He notes recurrent melena over the past 2 days. Soft, frequent stool, milkshake consistency. He notes increased frequency in stool since starting Linzess recently. No frank hematochezia. No abdominal pain. No N/V. Poor appetite. No dysphagia. Feels weak. No anticoagulation. Takes Tylenol as needed. No mental status changes or confusion. Denies taking oral iron currently.    Prior endoscopic evaluations:   EGD 01/27/21: small hiatal hernia, non-obstructing Schatzki's ring, portal gastropathy, duodenal scar indicative of healed prior ulcer.   EGD July 2022: normal esophagus, gastric erythema, non-bleeding duodenal ulcer with nonbleeding visible vessel s/p injection but unable to place clip. Underwent embolization of GDA with IR while inpatient.   Last colonoscopy 2019 with pancolonic diverticulosis, 3 polyps, external hemorrhoids. Tubular adenomas.   Past Medical History:  Diagnosis Date   Atrial flutter ([place]) 12/2010   Admitted with symptomatic bradycardia (HR 40s), atrial flutter with slow ventricular response 12/2010 + volume  overload; AV nodal agents d/c'd and Pradaxa started; RFA in 01/2011   Chronic kidney disease, stage 3b (HCC)    Chronic systolic CHF (congestive heart failure) (HCC)    Class 2 severe obesity due to excess calories with serious comorbidity and body mass index (BMI) of 37.0 to 37.9 in adult ([place]) 07/17/2017   Diabetes mellitus    non insulin dependant   Hematoma, chest wall 05/15/2020   Hyperlipidemia    Hypertension    Mitral regurgitation    NASH (nonalcoholic steatohepatitis)    NICM (nonischemic cardiomyopathy) (HCC)    Osteoarthritis    Polyarticular gout 05/15/2020   Presence of permanent cardiac pacemaker    PSVT (paroxysmal supraventricular tachycardia) (HCC)    Possibly atrial flutter   S/P TAVR (transcatheter aortic valve replacement) 05/03/2020   s/p TAVR with a 29 mm Edwards S3U via the subclavian approach with Dr. Angelena Form & Dr. Cyndia Bent    Severe aortic stenosis     Past Surgical History:  Procedure Laterality Date   ATRIAL FLUTTER ABLATION N/A 02/07/2011   Procedure: ATRIAL FLUTTER ABLATION;  Surgeon: Evans Lance, MD;  Location: [facility] CATH LAB;  Service: Cardiovascular;  Laterality: N/A;   BIV UPGRADE N/A 06/08/2020   Procedure: BIV PPM UPGRADE;  Surgeon: Evans Lance, MD;  Location: [place] CV LAB;  Service: Cardiovascular;  Laterality: N/A;   CARDIAC ELECTROPHYSIOLOGY STUDY AND ABLATION  02/07/2011   CARDIOVERSION N/A 03/23/2016   Procedure: CARDIOVERSION;  Surgeon: Evans Lance, MD;  Location: [place];  Service: Cardiovascular;  Laterality: N/A;   COLONOSCOPY N/A 04/17/2017   Rehman: 3 polyps in [facility], diverticulosis   EP IMPLANTABLE DEVICE N/A 08/29/2015   Procedure: Pacemaker Implant;  Surgeon: Champ Mungo  Lovena Le, MD;  Location: [place] CV LAB;  Service: Cardiovascular;  Laterality: N/A;   EP IMPLANTABLE DEVICE N/A 12/07/2015   Procedure: PPM Lead Revision/Repair;  Surgeon: Evans Lance, MD;  Location: [place] CV LAB;  Service: Cardiovascular;   Laterality: N/A;   ESOPHAGOGASTRODUODENOSCOPY (EGD) WITH PROPOFOL N/A 10/18/2020   Castaneda: normal esophagus, erythematous mucosa in gastric body, non bleeding duodenal ulcer with a non bleeding visible vessel (forrest Class IIa). Injected, clip placed, no specimens collected.   ESOPHAGOGASTRODUODENOSCOPY (EGD) WITH PROPOFOL N/A 01/27/2021   Procedure: ESOPHAGOGASTRODUODENOSCOPY (EGD) WITH PROPOFOL;  Surgeon: [name], DO;  [facility];  Service: Endoscopy;  Laterality: N/A;   IR ANGIOGRAM SELECTIVE EACH ADDITIONAL VESSEL  10/19/2020   IR ANGIOGRAM SELECTIVE EACH ADDITIONAL VESSEL  10/19/2020   IR ANGIOGRAM VISCERAL SELECTIVE  10/19/2020   IR ANGIOGRAM VISCERAL SELECTIVE  10/19/2020   IR EMBO ART  VEN HEMORR LYMPH EXTRAV  INC GUIDE ROADMAPPING  10/19/2020   IR US GUIDE VASC ACCESS RIGHT  10/19/2020   KNEE ARTHROSCOPY  03/27/2003   left   LUMBAR SPINE SURGERY     "I've had 6 ORs 1972 thru 2004"   POLYPECTOMY  04/17/2017   Procedure: POLYPECTOMY;  Surgeon: Rogene [place], MD;  [facility];  Service: Endoscopy;;  transverse colon x3;   RIGHT HEART CATH N/A 04/28/2020   Procedure: RIGHT HEART CATH;  Surgeon: Jolaine Artist, MD;  Location: [place] CV LAB;  Service: Cardiovascular;  Laterality: N/A;   RIGHT/LEFT HEART CATH AND CORONARY ANGIOGRAPHY N/A 12/21/2019   Procedure: RIGHT/LEFT HEART CATH AND CORONARY ANGIOGRAPHY;  Surgeon: Nelva Bush, MD;  Location: [place] CV LAB;  Service: Cardiovascular;  Laterality: N/A;   TEE WITHOUT CARDIOVERSION N/A 11/05/2019   Procedure: TRANSESOPHAGEAL ECHOCARDIOGRAM (TEE) WITH PROPOFOL;  Surgeon: Arnoldo Lenis, MD;  [facility];  Service: Endoscopy;  Laterality: N/A;   TEE WITHOUT CARDIOVERSION N/A 05/03/2020   Procedure: TRANSESOPHAGEAL ECHOCARDIOGRAM (TEE);  Surgeon: Burnell Blanks, MD;  Location: [place];  Service: Open Heart Surgery;  Laterality: N/A;    Prior to Admission  medications   Medication Sig Start Date End Date Taking? Authorizing Provider  ACCU-CHEK AVIVA PLUS test strip TEST BLOOD SUGAR TWICE DAILY BEFORE BREAKFAST AND AT BEDTIME 07/19/20   Cassandria Anger, MD  acetaminophen (TYLENOL) 325 MG tablet Take 2 tablets (650 mg total) by mouth every 4 (four) hours as needed for headache or mild pain. 05/15/20   Isaiah Serge, NP  allopurinol (ZYLOPRIM) 300 MG tablet Take 150 mg by mouth as needed.    [provider]  aspirin EC 81 MG tablet Take 1 tablet (81 mg total) by mouth daily with breakfast. 10/25/20 10/25/21  Roxan Hockey, MD  benzonatate (TESSALON) 100 MG capsule Take 1 capsule (100 mg total) by mouth 3 (three) times daily as needed for cough. 09/27/20   Evans Lance, MD  camphor-menthol (ANTI-ITCH) lotion Apply 1 application topically daily as needed for itching.    [provider]  carvedilol (COREG) 3.125 MG tablet Take 1 tablet (3.125 mg total) by mouth 2 (two) times daily with a meal. 01/30/21   Milford, Maricela Bo, FNP  cefdinir (OMNICEF) 300 MG capsule Take 1 capsule (300 mg total) by mouth every 12 (twelve) hours. 01/29/21   Orson Eva, MD  colchicine 0.6 MG tablet Take 1 tablet (0.6 mg total) by mouth daily as needed. 05/15/20   Isaiah Serge, NP  Dulaglutide (  TRULICITY) 1.5 IE/3.3IR SOPN INJECT 1.5MG (1 PEN) SUBCUTANEOUSLY EVERY WEEK 10/05/20   Cassandria Anger, MD  ferrous sulfate 325 (65 FE) MG tablet Take 1 tablet (325 mg total) by mouth daily with breakfast. 01/29/21   Tat, Shanon Brow, MD  hydrocortisone cream 1 % Apply 1 application topically daily.    [provider]  insulin degludec (TRESIBA FLEXTOUCH) 100 UNIT/ML FlexTouch Pen Inject 40 Units into the skin at bedtime. 11/01/20   Cassandria Anger, MD  Insulin Pen Needle (BD PEN NEEDLE NANO U/F) 32G X 4 MM MISC 1 each by Does not apply route 4 (four) times daily. 12/23/18   Cassandria Anger, MD  linaclotide Rolan Lipa) 145 MCG CAPS capsule Take 1 capsule  (145 mcg total) by mouth daily before breakfast. 01/26/21   Montez Morita, Quillian Quince, MD  metolazone (ZAROXOLYN) 2.5 MG tablet Take 1 tablet (2.5 mg total) by mouth once a week. Every Monday 01/30/21   Rafael Bihari, FNP  metroNIDAZOLE (FLAGYL) 500 MG tablet Take 1 tablet (500 mg total) by mouth every 12 (twelve) hours. 01/29/21   Orson Eva, MD  Multiple Vitamins-Minerals (ONE-A-DAY MENS 50+) TABS Take 1 tablet by mouth daily with breakfast. Men    [provider]  pantoprazole (PROTONIX) 40 MG tablet Take 1 tablet (40 mg total) by mouth 2 (two) times daily. 01/29/21   Orson Eva, MD  polyethylene glycol-electrolytes (TRILYTE) 420 g solution Take 4,000 mLs by mouth as directed. 02/01/21   Harvel Quale, MD  potassium chloride SA (KLOR-CON M20) 20 MEQ tablet Take 1.5 tablets (30 mEq total) by mouth 2 (two) times daily. Take 2 extra tabs on Mon with metolazone 12/12/20   Bensimhon, Shaune Pascal, MD  spironolactone (ALDACTONE) 25 MG tablet Take 0.5 tablets (12.5 mg total) by mouth daily. 07/20/20   Bensimhon, Shaune Pascal, MD  sucralfate (CARAFATE) 1 g tablet Take 1 tablet (1 g total) by mouth 4 (four) times daily. 10/22/20 10/22/21  Roxan Hockey, MD  torsemide (DEMADEX) 20 MG tablet TAKE 3 TABLETS (60 MG TOTAL) BY MOUTH 2 (TWO) TIMES DAILY. Patient taking differently: Take 20 mg by mouth 2 (two) times daily. 11/01/20   Bensimhon, Shaune Pascal, MD    Current Facility-Administered Medications  Medication Dose Route Frequency Provider Last Rate Last Admin   0.9 %  sodium chloride infusion (Manually program via Guardrails IV Fluids)   Intravenous Once Debbe Mounts R, PA-C       0.9 %  sodium chloride infusion  250 mL Intravenous PRN Shahmehdi, Valeria Batman, MD       acetaminophen (TYLENOL) tablet 650 mg  650 mg Oral Q6H PRN Shahmehdi, Seyed A, MD       Or   acetaminophen (TYLENOL) suppository 650 mg  650 mg Rectal Q6H PRN Shahmehdi, Seyed A, MD       bisacodyl (DULCOLAX) EC tablet 5 mg  5 mg  Oral Daily PRN Shahmehdi, Seyed A, MD       calcium gluconate 1 g/ 50 mL sodium chloride IVPB  1 g Intravenous Once Debbe Mounts R, PA-C       heparin injection 5,000 Units  5,000 Units Subcutaneous Q8H Shahmehdi, Seyed A, MD       hydrALAZINE (APRESOLINE) injection 10 mg  10 mg Intravenous Q4H PRN Shahmehdi, Seyed A, MD       HYDROmorphone (DILAUDID) injection 0.5-1 mg  0.5-1 mg Intravenous Q2H PRN Shahmehdi, Seyed A, MD       ipratropium (ATROVENT) nebulizer solution 0.5  mg  0.5 mg Nebulization Q6H PRN Shahmehdi, Seyed A, MD       levalbuterol (XOPENEX) nebulizer solution 0.63 mg  0.63 mg Nebulization Q6H PRN Shahmehdi, Seyed A, MD       ondansetron (ZOFRAN) tablet 4 mg  4 mg Oral Q6H PRN Shahmehdi, Seyed A, MD       Or   ondansetron (ZOFRAN) injection 4 mg  4 mg Intravenous Q6H PRN Shahmehdi, Seyed A, MD       oxyCODONE (Oxy IR/ROXICODONE) immediate release tablet 5 mg  5 mg Oral Q4H PRN Shahmehdi, Seyed A, MD       pantoprazole (PROTONIX) injection 80 mg  80 mg Intravenous Once Badalamente, Peter R, PA-C       senna-docusate (Senokot-S) tablet 1 tablet  1 tablet Oral QHS PRN Shahmehdi, Seyed A, MD       sodium chloride flush (NS) 0.9 % injection 3 mL  3 mL Intravenous Q12H Shahmehdi, Seyed A, MD       sodium chloride flush (NS) 0.9 % injection 3 mL  3 mL Intravenous Q12H Shahmehdi, Seyed A, MD       sodium chloride flush (NS) 0.9 % injection 3 mL  3 mL Intravenous PRN Shahmehdi, Seyed A, MD       traZODone (DESYREL) tablet 25 mg  25 mg Oral QHS PRN Shahmehdi, Valeria Batman, MD       Current Outpatient Medications  Medication Sig Dispense Refill   ACCU-CHEK AVIVA PLUS test strip TEST BLOOD SUGAR TWICE DAILY BEFORE BREAKFAST AND AT BEDTIME 200 strip 1   acetaminophen (TYLENOL) 325 MG tablet Take 2 tablets (650 mg total) by mouth every 4 (four) hours as needed for headache or mild pain.     allopurinol (ZYLOPRIM) 300 MG tablet Take 150 mg by mouth as needed.     aspirin EC 81 MG tablet Take 1  tablet (81 mg total) by mouth daily with breakfast. 30 tablet 2   benzonatate (TESSALON) 100 MG capsule Take 1 capsule (100 mg total) by mouth 3 (three) times daily as needed for cough. 90 capsule 11   camphor-menthol (ANTI-ITCH) lotion Apply 1 application topically daily as needed for itching.     carvedilol (COREG) 3.125 MG tablet Take 1 tablet (3.125 mg total) by mouth 2 (two) times daily with a meal. 60 tablet 4   cefdinir (OMNICEF) 300 MG capsule Take 1 capsule (300 mg total) by mouth every 12 (twelve) hours. 13 capsule 0   colchicine 0.6 MG tablet Take 1 tablet (0.6 mg total) by mouth daily as needed. 30 tablet 6   Dulaglutide (TRULICITY) 1.5 UQ/3.3HL SOPN INJECT 1.5MG (1 PEN) SUBCUTANEOUSLY EVERY WEEK 6 mL 1   ferrous sulfate 325 (65 FE) MG tablet Take 1 tablet (325 mg total) by mouth daily with breakfast.  3   hydrocortisone cream 1 % Apply 1 application topically daily.     insulin degludec (TRESIBA FLEXTOUCH) 100 UNIT/ML FlexTouch Pen Inject 40 Units into the skin at bedtime. 15 mL 2   Insulin Pen Needle (BD PEN NEEDLE NANO U/F) 32G X 4 MM MISC 1 each by Does not apply route 4 (four) times daily. 150 each 5   linaclotide (LINZESS) 145 MCG CAPS capsule Take 1 capsule (145 mcg total) by mouth daily before breakfast. 90 capsule 3   metolazone (ZAROXOLYN) 2.5 MG tablet Take 1 tablet (2.5 mg total) by mouth once a week. Every Monday 5 tablet 4   metroNIDAZOLE (FLAGYL) 500 MG tablet Take  1 tablet (500 mg total) by mouth every 12 (twelve) hours. 13 tablet 0   Multiple Vitamins-Minerals (ONE-A-DAY MENS 50+) TABS Take 1 tablet by mouth daily with breakfast. Men     pantoprazole (PROTONIX) 40 MG tablet Take 1 tablet (40 mg total) by mouth 2 (two) times daily. 60 tablet 1   polyethylene glycol-electrolytes (TRILYTE) 420 g solution Take 4,000 mLs by mouth as directed. 4000 mL 0   potassium chloride SA (KLOR-CON M20) 20 MEQ tablet Take 1.5 tablets (30 mEq total) by mouth 2 (two) times daily. Take 2  extra tabs on Mon with metolazone 100 tablet 3   spironolactone (ALDACTONE) 25 MG tablet Take 0.5 tablets (12.5 mg total) by mouth daily. 15 tablet 6   sucralfate (CARAFATE) 1 g tablet Take 1 tablet (1 g total) by mouth 4 (four) times daily. 120 tablet 1   torsemide (DEMADEX) 20 MG tablet TAKE 3 TABLETS (60 MG TOTAL) BY MOUTH 2 (TWO) TIMES DAILY. (Patient taking differently: Take 20 mg by mouth 2 (two) times daily.) 540 tablet 1    Allergies as of 02/06/2021   (No Known Allergies)    Family History  Problem Relation Age of Onset   Heart attack Mother    Hypertension Mother    Heart attack Father    Hypertension Father    Heart attack Brother    Colon cancer Neg Hx     Social History   Socioeconomic History   Marital status: Married    Spouse name: Not on file   Number of children: 2   Years of education: Not on file   Highest education level: Not on file  Occupational History   Occupation: Designer, industrial/product    Employer: RETIRED  Tobacco Use   Smoking status: Former    Packs/day: 1.00    Years: 10.00    Pack years: 10.00    Types: Cigarettes    Quit date: 03/26/1986    Years since quitting: 34.8   Smokeless tobacco: Never   Tobacco comments:    "stopped cigarette  smoking 1988"  Vaping Use   Vaping Use: Never used  Substance and Sexual Activity   Alcohol use: Not Currently    Alcohol/week: 0.0 standard drinks    Comment: "quit alcohol ~ 2007"   Drug use: No   Sexual activity: Yes    Partners: Female  Other Topics Concern   Not on file  Social History Narrative   Not on file   Social Determinants of Health   Financial Resource Strain: Not on file  Food Insecurity: No Food Insecurity   Worried About Running Out of Food in the Last Year: Never true   [place] in the Last Year: Never true  Transportation Needs: No Transportation Needs   Lack of Transportation (Medical): No   Lack of Transportation (Non-Medical): No  Physical Activity: Not on file   Stress: Not on file  Social Connections: Not on file  Intimate Partner Violence: Not on file    Review of Systems: Gen: Denies fever, chills, loss of appetite, change in weight or weight loss CV: Denies chest pain, heart palpitations, syncope, edema  Resp: Denies shortness of breath with rest, cough, wheezing GI: see HPI GU : Denies urinary burning, urinary frequency, urinary incontinence.  MS: Denies joint pain,swelling, cramping Derm: Denies rash, itching, dry skin Psych: Denies depression, anxiety,confusion, or memory loss Heme: see HPI  Physical Exam: Vital signs in last 24 hours: Temp:  [97.6 F (36.4  C)] 97.6 F (36.4 C) (11/14 1235) Pulse Rate:  [59-64] 64 (11/14 1413) Resp:  [18] 18 (11/14 1413) BP: (104-108)/(49-53) 104/49 (11/14 1413) SpO2:  [100 %] 100 % (11/14 1413)   General:   Alert,  Well-developed, weak, ill-appearing Head:  Normocephalic and atraumatic. Eyes:  Sclera clear, no icterus.   Ears:  Normal auditory acuity. Nose:  No deformity, discharge,  or lesions. Mouth:  No deformity or lesions, dentition normal. Lungs:  Clear throughout to auscultation.    Heart:  S1 S2 present, no obvious murmur Abdomen:  Soft, nontender and nondistended. No masses, hepatosplenomegaly or hernias noted. Normal bowel sounds, without guarding, and without rebound.   Rectal:  Deferred until time of colonoscopy.   Msk:  Symmetrical without gross deformities. Normal posture. Extremities:  Without edema. Neurologic:  Alert and  oriented x4 Psych:  Alert and cooperative. Normal mood and affect.  Intake/Output from previous day: No intake/output data recorded. Intake/Output this shift: No intake/output data recorded.  Lab Results: Recent Labs    02/06/21 1300  WBC 8.1  HGB 6.4*  HCT 21.6*  PLT 176   BMET Recent Labs    02/06/21 1300  NA 126*  K 5.3*  CL 97*  CO2 21*  GLUCOSE 276*  BUN 98*  CREATININE 2.24*  CALCIUM 7.9*   LFT Recent Labs     02/06/21 1300  PROT 6.1*  ALBUMIN 2.6*  AST 27  ALT 16  ALKPHOS 176*  BILITOT 2.3*   PT/INR Recent Labs    02/06/21 1300  LABPROT 20.2*  INR 1.7*     Impression: 75 y.o. year old male with a history of NASH cirrhosis (MELD Na 29 today), duodenal ulcer s/p embolization in July 2022 and surveillance EGD 01/27/21 with healed ulcer and portal gastropathy, history of diverticulitis 01/26/21 while inpatient, IDA, returning with symptomatic anemia and reported melena.   Acute blood loss anemia: with melena. Heme positive. Hgb 6.4, down from 8 last week. Agree with 2 units PRBCs already ordered. Will order clear liquids.  EGD recently on file 11.4.22 as noted with healed ulcer and portal gastropathy. He is not on any anticoagulation currently. In light of GI bleed and cirrhosis, will need to be on empiric antibiotics. No octreotide as does not have varices. Will pursue capsule study. Ultimately will need colonoscopy but will allow a few more weeks due to recent bout with diverticulitis.   CKD: worsening creatinine from baseline.   Hyponatremia and hyperkalemia: per hospitalist.   History of diverticulitis: CT abd/pelvis without contrast on 11/3 with mild uncomplicated sigmoid diverticulitis. Clinically improved. Abdominal exam benign. Last colonoscopy 2019 with diverticulosis and tubular adenomas. With recent bout of diverticulitis, will hold off on colonoscopy for a few more weeks unless clinical condition changes. Capsule planned for 11/15.     Plan: Clear liquids Hold iron while inpatient in preparation for procedures IV PPI BID IV antibiotics empirically due to history of GI bleed in cirrhosis Agree with transfusion Repeat CBC, INR, CMP in am Will reassess in am Capsule study 11/15  Annitta Needs, PhD, ANP-BC [facility] Gastroenterology       LOS: 0 days    02/06/2021, 2:37 PM

## 2021-02-06 NOTE — ED Triage Notes (Signed)
Dark tarry stools onset yesterday

## 2021-02-06 NOTE — Telephone Encounter (Signed)
Scheduled the patient with Dr. Quentin Ore 03/22/2021. He was grateful for call and agrees with plan.

## 2021-02-06 NOTE — Telephone Encounter (Signed)
Patient wife aware of all and will take back to the ed to have all evaluated.   Per Dr. Jenetta Downer patient has a tcs scheduled for 02/14/2021 to evaluate the bleeding and black stool. He will need to call the Cardiologist or go to Ed for evaluation of the sob and edema.  Patient wife called today stating the patient is having dark stools, and diarrhea. He has been 4 times this morning. He has a history of CHF and is having some issues with sob with lying down, and edema bilaterally in feet. On zaroxolyn q Monday and Torsemide 20 mg two tablets per day. He also has a cough.No appetite.

## 2021-02-06 NOTE — Telephone Encounter (Signed)
Thanks

## 2021-02-06 NOTE — ED Notes (Signed)
Attempted to call report x 1  

## 2021-02-06 NOTE — Consult Note (Addendum)
Referring Provider: [facility] Primary Care Physician:  [name], MD Primary Gastroenterologist:  Dr. Jenetta Downer  Date of Admission: 02/06/21 Date of Consultation: 02/06/21  Reason for Consultation:  GI bleed  HPI:  John [name] is a 75 y.o. year old male with a history of NASH cirrhosis, duodenal ulcer s/p embolization in July 2022 and surveillance EGD 01/27/21 with healed ulcer and portal gastropathy, history of diverticulitis 01/26/21 while inpatient, IDA, returning with symptomatic anemia and reported melena. Prior to presentation today, plans had been for outpatient colonoscopy, which was arranged for next week.   Hgb 6.4 on admission, down from 8 last week. Hyponatremia with sodium 126, potassium 5.3, BUN 98, and creatinine worsened from baseline at 2.24, previously in the upper 1 range (1.93 a week ago). INR 1.7. 2 units PRBCs have been ordered.   He notes recurrent melena over the past 2 days. Soft, frequent stool, milkshake consistency. He notes increased frequency in stool since starting Linzess recently. No frank hematochezia. No abdominal pain. No N/V. Poor appetite. No dysphagia. Feels weak. No anticoagulation. Takes Tylenol as needed. No mental status changes or confusion. Denies taking oral iron currently.    Prior endoscopic evaluations:   EGD 01/27/21: small hiatal hernia, non-obstructing Schatzki's ring, portal gastropathy, duodenal scar indicative of healed prior ulcer.   EGD July 2022: normal esophagus, gastric erythema, non-bleeding duodenal ulcer with nonbleeding visible vessel s/p injection but unable to place clip. Underwent embolization of GDA with IR while inpatient.   Last colonoscopy 2019 with pancolonic diverticulosis, 3 polyps, external hemorrhoids. Tubular adenomas.   Past Medical History:  Diagnosis Date   Atrial flutter ([place]) 12/2010   Admitted with symptomatic bradycardia (HR 40s), atrial flutter with slow ventricular response 12/2010 + volume  overload; AV nodal agents d/c'd and Pradaxa started; RFA in 01/2011   Chronic kidney disease, stage 3b (HCC)    Chronic systolic CHF (congestive heart failure) (HCC)    Class 2 severe obesity due to excess calories with serious comorbidity and body mass index (BMI) of 37.0 to 37.9 in adult ([place]) 07/17/2017   Diabetes mellitus    non insulin dependant   Hematoma, chest wall 05/15/2020   Hyperlipidemia    Hypertension    Mitral regurgitation    NASH (nonalcoholic steatohepatitis)    NICM (nonischemic cardiomyopathy) (HCC)    Osteoarthritis    Polyarticular gout 05/15/2020   Presence of permanent cardiac pacemaker    PSVT (paroxysmal supraventricular tachycardia) (HCC)    Possibly atrial flutter   S/P TAVR (transcatheter aortic valve replacement) 05/03/2020   s/p TAVR with a 29 mm Edwards S3U via the subclavian approach with Dr. Angelena Form & Dr. Cyndia Bent    Severe aortic stenosis     Past Surgical History:  Procedure Laterality Date   ATRIAL FLUTTER ABLATION N/A 02/07/2011   Procedure: ATRIAL FLUTTER ABLATION;  Surgeon: Evans Lance, MD;  Location: [facility] CATH LAB;  Service: Cardiovascular;  Laterality: N/A;   BIV UPGRADE N/A 06/08/2020   Procedure: BIV PPM UPGRADE;  Surgeon: Evans Lance, MD;  Location: [place] CV LAB;  Service: Cardiovascular;  Laterality: N/A;   CARDIAC ELECTROPHYSIOLOGY STUDY AND ABLATION  02/07/2011   CARDIOVERSION N/A 03/23/2016   Procedure: CARDIOVERSION;  Surgeon: Evans Lance, MD;  Location: [place];  Service: Cardiovascular;  Laterality: N/A;   COLONOSCOPY N/A 04/17/2017   Rehman: 3 polyps in [facility], diverticulosis   EP IMPLANTABLE DEVICE N/A 08/29/2015   Procedure: Pacemaker Implant;  Surgeon: Champ Mungo  Lovena Le, MD;  Location: [place] CV LAB;  Service: Cardiovascular;  Laterality: N/A;   EP IMPLANTABLE DEVICE N/A 12/07/2015   Procedure: PPM Lead Revision/Repair;  Surgeon: Evans Lance, MD;  Location: [place] CV LAB;  Service: Cardiovascular;   Laterality: N/A;   ESOPHAGOGASTRODUODENOSCOPY (EGD) WITH PROPOFOL N/A 10/18/2020   Castaneda: normal esophagus, erythematous mucosa in gastric body, non bleeding duodenal ulcer with a non bleeding visible vessel (forrest Class IIa). Injected, clip placed, no specimens collected.   ESOPHAGOGASTRODUODENOSCOPY (EGD) WITH PROPOFOL N/A 01/27/2021   Procedure: ESOPHAGOGASTRODUODENOSCOPY (EGD) WITH PROPOFOL;  Surgeon: [name], DO;  [facility];  Service: Endoscopy;  Laterality: N/A;   IR ANGIOGRAM SELECTIVE EACH ADDITIONAL VESSEL  10/19/2020   IR ANGIOGRAM SELECTIVE EACH ADDITIONAL VESSEL  10/19/2020   IR ANGIOGRAM VISCERAL SELECTIVE  10/19/2020   IR ANGIOGRAM VISCERAL SELECTIVE  10/19/2020   IR EMBO ART  VEN HEMORR LYMPH EXTRAV  INC GUIDE ROADMAPPING  10/19/2020   IR US GUIDE VASC ACCESS RIGHT  10/19/2020   KNEE ARTHROSCOPY  03/27/2003   left   LUMBAR SPINE SURGERY     "I've had 6 ORs 1972 thru 2004"   POLYPECTOMY  04/17/2017   Procedure: POLYPECTOMY;  Surgeon: Rogene Houston, MD;  [facility];  Service: Endoscopy;;  transverse colon x3;   RIGHT HEART CATH N/A 04/28/2020   Procedure: RIGHT HEART CATH;  Surgeon: Jolaine Artist, MD;  Location: [place] CV LAB;  Service: Cardiovascular;  Laterality: N/A;   RIGHT/LEFT HEART CATH AND CORONARY ANGIOGRAPHY N/A 12/21/2019   Procedure: RIGHT/LEFT HEART CATH AND CORONARY ANGIOGRAPHY;  Surgeon: Nelva Bush, MD;  Location: [place] CV LAB;  Service: Cardiovascular;  Laterality: N/A;   TEE WITHOUT CARDIOVERSION N/A 11/05/2019   Procedure: TRANSESOPHAGEAL ECHOCARDIOGRAM (TEE) WITH PROPOFOL;  Surgeon: Arnoldo Lenis, MD;  [facility];  Service: Endoscopy;  Laterality: N/A;   TEE WITHOUT CARDIOVERSION N/A 05/03/2020   Procedure: TRANSESOPHAGEAL ECHOCARDIOGRAM (TEE);  Surgeon: Burnell Blanks, MD;  Location: [place];  Service: Open Heart Surgery;  Laterality: N/A;    Prior to Admission  medications   Medication Sig Start Date End Date Taking? Authorizing Provider  ACCU-CHEK AVIVA PLUS test strip TEST BLOOD SUGAR TWICE DAILY BEFORE BREAKFAST AND AT BEDTIME 07/19/20   Cassandria Anger, MD  acetaminophen (TYLENOL) 325 MG tablet Take 2 tablets (650 mg total) by mouth every 4 (four) hours as needed for headache or mild pain. 05/15/20   Isaiah Serge, NP  allopurinol (ZYLOPRIM) 300 MG tablet Take 150 mg by mouth as needed.    [provider]  aspirin EC 81 MG tablet Take 1 tablet (81 mg total) by mouth daily with breakfast. 10/25/20 10/25/21  Roxan Hockey, MD  benzonatate (TESSALON) 100 MG capsule Take 1 capsule (100 mg total) by mouth 3 (three) times daily as needed for cough. 09/27/20   Evans Lance, MD  camphor-menthol (ANTI-ITCH) lotion Apply 1 application topically daily as needed for itching.    [provider]  carvedilol (COREG) 3.125 MG tablet Take 1 tablet (3.125 mg total) by mouth 2 (two) times daily with a meal. 01/30/21   Milford, Maricela Bo, FNP  cefdinir (OMNICEF) 300 MG capsule Take 1 capsule (300 mg total) by mouth every 12 (twelve) hours. 01/29/21   Orson Eva, MD  colchicine 0.6 MG tablet Take 1 tablet (0.6 mg total) by mouth daily as needed. 05/15/20   Isaiah Serge, NP  Dulaglutide (  TRULICITY) 1.5 ZH/0.8MV SOPN INJECT 1.5MG (1 PEN) SUBCUTANEOUSLY EVERY WEEK 10/05/20   Cassandria Anger, MD  ferrous sulfate 325 (65 FE) MG tablet Take 1 tablet (325 mg total) by mouth daily with breakfast. 01/29/21   Tat, Shanon Brow, MD  hydrocortisone cream 1 % Apply 1 application topically daily.    [provider]  insulin degludec (TRESIBA FLEXTOUCH) 100 UNIT/ML FlexTouch Pen Inject 40 Units into the skin at bedtime. 11/01/20   Cassandria Anger, MD  Insulin Pen Needle (BD PEN NEEDLE NANO U/F) 32G X 4 MM MISC 1 each by Does not apply route 4 (four) times daily. 12/23/18   Cassandria Anger, MD  linaclotide Rolan Lipa) 145 MCG CAPS capsule Take 1 capsule  (145 mcg total) by mouth daily before breakfast. 01/26/21   Montez Morita, Quillian Quince, MD  metolazone (ZAROXOLYN) 2.5 MG tablet Take 1 tablet (2.5 mg total) by mouth once a week. Every Monday 01/30/21   Rafael Bihari, FNP  metroNIDAZOLE (FLAGYL) 500 MG tablet Take 1 tablet (500 mg total) by mouth every 12 (twelve) hours. 01/29/21   Orson Eva, MD  Multiple Vitamins-Minerals (ONE-A-DAY MENS 50+) TABS Take 1 tablet by mouth daily with breakfast. Men    [provider]  pantoprazole (PROTONIX) 40 MG tablet Take 1 tablet (40 mg total) by mouth 2 (two) times daily. 01/29/21   Orson Eva, MD  polyethylene glycol-electrolytes (TRILYTE) 420 g solution Take 4,000 mLs by mouth as directed. 02/01/21   Harvel Quale, MD  potassium chloride SA (KLOR-CON M20) 20 MEQ tablet Take 1.5 tablets (30 mEq total) by mouth 2 (two) times daily. Take 2 extra tabs on Mon with metolazone 12/12/20   Bensimhon, Shaune Pascal, MD  spironolactone (ALDACTONE) 25 MG tablet Take 0.5 tablets (12.5 mg total) by mouth daily. 07/20/20   Bensimhon, Shaune Pascal, MD  sucralfate (CARAFATE) 1 g tablet Take 1 tablet (1 g total) by mouth 4 (four) times daily. 10/22/20 10/22/21  Roxan Hockey, MD  torsemide (DEMADEX) 20 MG tablet TAKE 3 TABLETS (60 MG TOTAL) BY MOUTH 2 (TWO) TIMES DAILY. Patient taking differently: Take 20 mg by mouth 2 (two) times daily. 11/01/20   Bensimhon, Shaune Pascal, MD    Current Facility-Administered Medications  Medication Dose Route Frequency Provider Last Rate Last Admin   0.9 %  sodium chloride infusion (Manually program via Guardrails IV Fluids)   Intravenous Once Debbe Mounts R, PA-C       0.9 %  sodium chloride infusion  250 mL Intravenous PRN Shahmehdi, Valeria Batman, MD       acetaminophen (TYLENOL) tablet 650 mg  650 mg Oral Q6H PRN Shahmehdi, Seyed A, MD       Or   acetaminophen (TYLENOL) suppository 650 mg  650 mg Rectal Q6H PRN Shahmehdi, Seyed A, MD       bisacodyl (DULCOLAX) EC tablet 5 mg  5 mg  Oral Daily PRN Shahmehdi, Seyed A, MD       calcium gluconate 1 g/ 50 mL sodium chloride IVPB  1 g Intravenous Once Debbe Mounts R, PA-C       heparin injection 5,000 Units  5,000 Units Subcutaneous Q8H Shahmehdi, Seyed A, MD       hydrALAZINE (APRESOLINE) injection 10 mg  10 mg Intravenous Q4H PRN Shahmehdi, Seyed A, MD       HYDROmorphone (DILAUDID) injection 0.5-1 mg  0.5-1 mg Intravenous Q2H PRN Shahmehdi, Seyed A, MD       ipratropium (ATROVENT) nebulizer solution 0.5  mg  0.5 mg Nebulization Q6H PRN Shahmehdi, Seyed A, MD       levalbuterol (XOPENEX) nebulizer solution 0.63 mg  0.63 mg Nebulization Q6H PRN Shahmehdi, Seyed A, MD       ondansetron (ZOFRAN) tablet 4 mg  4 mg Oral Q6H PRN Shahmehdi, Seyed A, MD       Or   ondansetron (ZOFRAN) injection 4 mg  4 mg Intravenous Q6H PRN Shahmehdi, Seyed A, MD       oxyCODONE (Oxy IR/ROXICODONE) immediate release tablet 5 mg  5 mg Oral Q4H PRN Shahmehdi, Seyed A, MD       pantoprazole (PROTONIX) injection 80 mg  80 mg Intravenous Once Badalamente, Peter R, PA-C       senna-docusate (Senokot-S) tablet 1 tablet  1 tablet Oral QHS PRN Shahmehdi, Seyed A, MD       sodium chloride flush (NS) 0.9 % injection 3 mL  3 mL Intravenous Q12H Shahmehdi, Seyed A, MD       sodium chloride flush (NS) 0.9 % injection 3 mL  3 mL Intravenous Q12H Shahmehdi, Seyed A, MD       sodium chloride flush (NS) 0.9 % injection 3 mL  3 mL Intravenous PRN Shahmehdi, Seyed A, MD       traZODone (DESYREL) tablet 25 mg  25 mg Oral QHS PRN Shahmehdi, Valeria Batman, MD       Current Outpatient Medications  Medication Sig Dispense Refill   ACCU-CHEK AVIVA PLUS test strip TEST BLOOD SUGAR TWICE DAILY BEFORE BREAKFAST AND AT BEDTIME 200 strip 1   acetaminophen (TYLENOL) 325 MG tablet Take 2 tablets (650 mg total) by mouth every 4 (four) hours as needed for headache or mild pain.     allopurinol (ZYLOPRIM) 300 MG tablet Take 150 mg by mouth as needed.     aspirin EC 81 MG tablet Take 1  tablet (81 mg total) by mouth daily with breakfast. 30 tablet 2   benzonatate (TESSALON) 100 MG capsule Take 1 capsule (100 mg total) by mouth 3 (three) times daily as needed for cough. 90 capsule 11   camphor-menthol (ANTI-ITCH) lotion Apply 1 application topically daily as needed for itching.     carvedilol (COREG) 3.125 MG tablet Take 1 tablet (3.125 mg total) by mouth 2 (two) times daily with a meal. 60 tablet 4   cefdinir (OMNICEF) 300 MG capsule Take 1 capsule (300 mg total) by mouth every 12 (twelve) hours. 13 capsule 0   colchicine 0.6 MG tablet Take 1 tablet (0.6 mg total) by mouth daily as needed. 30 tablet 6   Dulaglutide (TRULICITY) 1.5 KW/4.0XB SOPN INJECT 1.5MG (1 PEN) SUBCUTANEOUSLY EVERY WEEK 6 mL 1   ferrous sulfate 325 (65 FE) MG tablet Take 1 tablet (325 mg total) by mouth daily with breakfast.  3   hydrocortisone cream 1 % Apply 1 application topically daily.     insulin degludec (TRESIBA FLEXTOUCH) 100 UNIT/ML FlexTouch Pen Inject 40 Units into the skin at bedtime. 15 mL 2   Insulin Pen Needle (BD PEN NEEDLE NANO U/F) 32G X 4 MM MISC 1 each by Does not apply route 4 (four) times daily. 150 each 5   linaclotide (LINZESS) 145 MCG CAPS capsule Take 1 capsule (145 mcg total) by mouth daily before breakfast. 90 capsule 3   metolazone (ZAROXOLYN) 2.5 MG tablet Take 1 tablet (2.5 mg total) by mouth once a week. Every Monday 5 tablet 4   metroNIDAZOLE (FLAGYL) 500 MG tablet Take  1 tablet (500 mg total) by mouth every 12 (twelve) hours. 13 tablet 0   Multiple Vitamins-Minerals (ONE-A-DAY MENS 50+) TABS Take 1 tablet by mouth daily with breakfast. Men     pantoprazole (PROTONIX) 40 MG tablet Take 1 tablet (40 mg total) by mouth 2 (two) times daily. 60 tablet 1   polyethylene glycol-electrolytes (TRILYTE) 420 g solution Take 4,000 mLs by mouth as directed. 4000 mL 0   potassium chloride SA (KLOR-CON M20) 20 MEQ tablet Take 1.5 tablets (30 mEq total) by mouth 2 (two) times daily. Take 2  extra tabs on Mon with metolazone 100 tablet 3   spironolactone (ALDACTONE) 25 MG tablet Take 0.5 tablets (12.5 mg total) by mouth daily. 15 tablet 6   sucralfate (CARAFATE) 1 g tablet Take 1 tablet (1 g total) by mouth 4 (four) times daily. 120 tablet 1   torsemide (DEMADEX) 20 MG tablet TAKE 3 TABLETS (60 MG TOTAL) BY MOUTH 2 (TWO) TIMES DAILY. (Patient taking differently: Take 20 mg by mouth 2 (two) times daily.) 540 tablet 1    Allergies as of 02/06/2021   (No Known Allergies)    Family History  Problem Relation Age of Onset   Heart attack Mother    Hypertension Mother    Heart attack Father    Hypertension Father    Heart attack Brother    Colon cancer Neg Hx     Social History   Socioeconomic History   Marital status: Married    Spouse name: Not on file   Number of children: 2   Years of education: Not on file   Highest education level: Not on file  Occupational History   Occupation: Designer, industrial/product    Employer: RETIRED  Tobacco Use   Smoking status: Former    Packs/day: 1.00    Years: 10.00    Pack years: 10.00    Types: Cigarettes    Quit date: 03/26/1986    Years since quitting: 34.8   Smokeless tobacco: Never   Tobacco comments:    "stopped cigarette  smoking 1988"  Vaping Use   Vaping Use: Never used  Substance and Sexual Activity   Alcohol use: Not Currently    Alcohol/week: 0.0 standard drinks    Comment: "quit alcohol ~ 2007"   Drug use: No   Sexual activity: Yes    Partners: Female  Other Topics Concern   Not on file  Social History Narrative   Not on file   Social Determinants of Health   Financial Resource Strain: Not on file  Food Insecurity: No Food Insecurity   Worried About Running Out of Food in the Last Year: Never true   [place] in the Last Year: Never true  Transportation Needs: No Transportation Needs   Lack of Transportation (Medical): No   Lack of Transportation (Non-Medical): No  Physical Activity: Not on file   Stress: Not on file  Social Connections: Not on file  Intimate Partner Violence: Not on file    Review of Systems: Gen: Denies fever, chills, loss of appetite, change in weight or weight loss CV: Denies chest pain, heart palpitations, syncope, edema  Resp: Denies shortness of breath with rest, cough, wheezing GI: see HPI GU : Denies urinary burning, urinary frequency, urinary incontinence.  MS: Denies joint pain,swelling, cramping Derm: Denies rash, itching, dry skin Psych: Denies depression, anxiety,confusion, or memory loss Heme: see HPI  Physical Exam: Vital signs in last 24 hours: Temp:  [97.6 F (36.4  C)] 97.6 F (36.4 C) (11/14 1235) Pulse Rate:  [59-64] 64 (11/14 1413) Resp:  [18] 18 (11/14 1413) BP: (104-108)/(49-53) 104/49 (11/14 1413) SpO2:  [100 %] 100 % (11/14 1413)   General:   Alert,  Well-developed, weak, ill-appearing Head:  Normocephalic and atraumatic. Eyes:  Sclera clear, no icterus.   Ears:  Normal auditory acuity. Nose:  No deformity, discharge,  or lesions. Mouth:  No deformity or lesions, dentition normal. Lungs:  Clear throughout to auscultation.    Heart:  S1 S2 present, no obvious murmur Abdomen:  Soft, nontender and nondistended. No masses, hepatosplenomegaly or hernias noted. Normal bowel sounds, without guarding, and without rebound.   Rectal:  Deferred until time of colonoscopy.   Msk:  Symmetrical without gross deformities. Normal posture. Extremities:  Without edema. Neurologic:  Alert and  oriented x4 Psych:  Alert and cooperative. Normal mood and affect.  Intake/Output from previous day: No intake/output data recorded. Intake/Output this shift: No intake/output data recorded.  Lab Results: Recent Labs    02/06/21 1300  WBC 8.1  HGB 6.4*  HCT 21.6*  PLT 176   BMET Recent Labs    02/06/21 1300  NA 126*  K 5.3*  CL 97*  CO2 21*  GLUCOSE 276*  BUN 98*  CREATININE 2.24*  CALCIUM 7.9*   LFT Recent Labs     02/06/21 1300  PROT 6.1*  ALBUMIN 2.6*  AST 27  ALT 16  ALKPHOS 176*  BILITOT 2.3*   PT/INR Recent Labs    02/06/21 1300  LABPROT 20.2*  INR 1.7*     Impression: 75 y.o. year old male with a history of NASH cirrhosis (MELD Na 29 today), duodenal ulcer s/p embolization in July 2022 and surveillance EGD 01/27/21 with healed ulcer and portal gastropathy, history of diverticulitis 01/26/21 while inpatient, IDA, returning with symptomatic anemia and reported melena.   Acute blood loss anemia: with melena. Heme positive. Hgb 6.4, down from 8 last week. Agree with 2 units PRBCs already ordered. Will order clear liquids.  EGD recently on file 11.4.22 as noted with healed ulcer and portal gastropathy. He is not on any anticoagulation currently. In light of GI bleed and cirrhosis, will need to be on empiric antibiotics. No octreotide as does not have varices. Will pursue capsule study. Ultimately will need colonoscopy but will allow a few more weeks due to recent bout with diverticulitis.   CKD: worsening creatinine from baseline.   Hyponatremia and hyperkalemia: per hospitalist.   History of diverticulitis: CT abd/pelvis without contrast on 11/3 with mild uncomplicated sigmoid diverticulitis. Clinically improved. Abdominal exam benign. Last colonoscopy 2019 with diverticulosis and tubular adenomas. With recent bout of diverticulitis, will hold off on colonoscopy for a few more weeks unless clinical condition changes. Capsule planned for 11/15.     Plan: Clear liquids Hold iron while inpatient in preparation for procedures IV PPI BID IV antibiotics empirically due to history of GI bleed in cirrhosis Agree with transfusion Repeat CBC, INR, CMP in am Will reassess in am Capsule study 11/15  Annitta Needs, PhD, ANP-BC [facility]       LOS: 0 days    02/06/2021, 2:37 PM

## 2021-02-06 NOTE — ED Provider Notes (Signed)
[facility] EMERGENCY DEPARTMENT Provider Note   CSN: [id number] Arrival date & time: 02/06/21  1151     History No chief complaint on file.   John [name] is a 75 y.o. male with a history of atrial fibrillation, atrial flutter, CHF, CKD stage III, diabetes mellitus, hypertension, liver cirrhosis, diverticulosis, duodenal ulcer.  Presents to the emergency department with a chief complaint of melena.  Patient reports that he has had 4-5 episodes of melena since yesterday.  This morning patient had generalized abdominal "soreness," prior to having a bowel movement.  This was resolved after having his bowel movement.  Patient has no further episodes of pain.  Patient reports that he is at increased fatigue and generalized weakness since being discharged from hospital on 11/6.  Denies any episodes of chest pain, shortness of breath, lightheadedness, dizziness, syncope, pain with defecation, nausea, vomiting, diarrhea, constipation, fevers, chills, dysuria, hematuria, urinary urgency, genital swelling or tenderness.  Patient denies any blood thinner use, EtOH consumption, illicit drug use, frequent NSAID use.  Per chart review patient is followed by gastroenterologist Dr. Sharyon Cable ago.  Patient had EGD on 11/6.  Has colonoscopy scheduled for 11/22.    HPI     Past Medical History:  Diagnosis Date   Atrial flutter ([place]) 12/2010   Admitted with symptomatic bradycardia (HR 40s), atrial flutter with slow ventricular response 12/2010 + volume overload; AV nodal agents d/c'd and Pradaxa started; RFA in 01/2011   Chronic kidney disease, stage 3b (HCC)    Chronic systolic CHF (congestive heart failure) ([place])    Class 2 severe obesity due to excess calories with serious comorbidity and body mass index (BMI) of 37.0 to 37.9 in adult ([place]) 07/17/2017   Diabetes mellitus    non insulin dependant   Hematoma, chest wall 05/15/2020   Hyperlipidemia    Hypertension    Mitral regurgitation    NASH  (nonalcoholic steatohepatitis)    NICM (nonischemic cardiomyopathy) (HCC)    Osteoarthritis    Polyarticular gout 05/15/2020   Presence of permanent cardiac pacemaker    PSVT (paroxysmal supraventricular tachycardia) (HCC)    Possibly atrial flutter   S/P TAVR (transcatheter aortic valve replacement) 05/03/2020   s/p TAVR with a 29 mm Edwards S3U via the subclavian approach with Dr. Angelena Form & Dr. Cyndia Bent    Severe aortic stenosis     Patient Active Problem List   Diagnosis Date Noted   Melena 01/31/2021   Liver cirrhosis ([place]) 01/31/2021   Diverticulitis of colon 01/31/2021   Acute diverticulitis 01/26/2021   Acute on chronic systolic (congestive) heart failure ([place]) 12/31/2020   SOB (shortness of breath) 12/31/2020   GERD (gastroesophageal reflux disease) 12/31/2020   History of anemia due to chronic kidney disease 12/31/2020   Duodenal ulcer 11/22/2020   Iron deficiency anemia due to chronic blood loss 11/22/2020   Itching 11/22/2020   Hematoma, chest wall 05/15/2020   Polyarticular gout 05/15/2020   S/P TAVR (transcatheter aortic valve replacement) 05/03/2020   Severe aortic stenosis 04/25/2020   Chronic atrial fibrillation ([place]) 04/25/2020   Acute on chronic systolic CHF (congestive heart failure) ([place]) 04/25/2020   AKI (acute kidney injury) ([place]) 03/10/2020   Permanent atrial fibrillation ([address]) 03/10/2020   Calculus of gallbladder without cholecystitis without obstruction    Thrombocytopenia ([place]) 03/09/2020   Hypoalbuminemia 03/09/2020   NASH (nonalcoholic steatohepatitis) 03/03/2020   Constipation 03/03/2020   Lower leg edema 10/14/2019   Class 2 obesity    Acute on chronic combined systolic  and diastolic CHF (congestive heart failure) ([place]) 11/12/2018   Hyponatremia 11/11/2018   CKD (chronic kidney disease) stage 3, GFR 30-59 ml/min (HCC)    Paroxysmal atrial fibrillation ([place])    Pacemaker 03/02/2018   Atrial pacemaker lead displacement 12/07/2015   NSVT  (nonsustained ventricular tachycardia) 08/28/2015   Bradycardia 08/25/2015   Current use of long term anticoagulation 05/04/2011   Atrial flutter ([place]) 01/05/2011   DM type 2 causing vascular disease ([place]) 12/23/2009   Mixed hyperlipidemia 12/23/2009   Essential hypertension, benign 12/23/2009    Past Surgical History:  Procedure Laterality Date   ATRIAL FLUTTER ABLATION N/A 02/07/2011   Procedure: ATRIAL FLUTTER ABLATION;  Surgeon: Evans Lance, MD;  Location: [facility] CATH LAB;  Service: Cardiovascular;  Laterality: N/A;   BIV UPGRADE N/A 06/08/2020   Procedure: BIV PPM UPGRADE;  Surgeon: Evans Lance, MD;  Location: [place] CV LAB;  Service: Cardiovascular;  Laterality: N/A;   CARDIAC ELECTROPHYSIOLOGY STUDY AND ABLATION  02/07/2011   CARDIOVERSION N/A 03/23/2016   Procedure: CARDIOVERSION;  Surgeon: Evans Lance, MD;  Location: [place];  Service: Cardiovascular;  Laterality: N/A;   COLONOSCOPY N/A 04/17/2017   Rehman: 3 polyps in [facility], diverticulosis   EP IMPLANTABLE DEVICE N/A 08/29/2015   Procedure: Pacemaker Implant;  Surgeon: Evans Lance, MD;  Location: [place] CV LAB;  Service: Cardiovascular;  Laterality: N/A;   EP IMPLANTABLE DEVICE N/A 12/07/2015   Procedure: PPM Lead Revision/Repair;  Surgeon: Evans Lance, MD;  Location: [place] CV LAB;  Service: Cardiovascular;  Laterality: N/A;   ESOPHAGOGASTRODUODENOSCOPY (EGD) WITH PROPOFOL N/A 10/18/2020   Castaneda: normal esophagus, erythematous mucosa in gastric body, non bleeding duodenal ulcer with a non bleeding visible vessel (forrest Class IIa). Injected, clip placed, no specimens collected.   ESOPHAGOGASTRODUODENOSCOPY (EGD) WITH PROPOFOL N/A 01/27/2021   Procedure: ESOPHAGOGASTRODUODENOSCOPY (EGD) WITH PROPOFOL;  Surgeon: [name], DO;  [facility];  Service: Endoscopy;  Laterality: N/A;   IR ANGIOGRAM SELECTIVE EACH ADDITIONAL VESSEL  10/19/2020   IR ANGIOGRAM SELECTIVE EACH  ADDITIONAL VESSEL  10/19/2020   IR ANGIOGRAM VISCERAL SELECTIVE  10/19/2020   IR ANGIOGRAM VISCERAL SELECTIVE  10/19/2020   IR EMBO ART  VEN HEMORR LYMPH EXTRAV  INC GUIDE ROADMAPPING  10/19/2020   IR US GUIDE VASC ACCESS RIGHT  10/19/2020   KNEE ARTHROSCOPY  03/27/2003   left   LUMBAR SPINE SURGERY     "I've had 6 ORs 1972 thru 2004"   POLYPECTOMY  04/17/2017   Procedure: POLYPECTOMY;  Surgeon: Rogene Houston, MD;  [facility];  Service: Endoscopy;;  transverse colon x3;   RIGHT HEART CATH N/A 04/28/2020   Procedure: RIGHT HEART CATH;  Surgeon: Jolaine Artist, MD;  Location: [place] CV LAB;  Service: Cardiovascular;  Laterality: N/A;   RIGHT/LEFT HEART CATH AND CORONARY ANGIOGRAPHY N/A 12/21/2019   Procedure: RIGHT/LEFT HEART CATH AND CORONARY ANGIOGRAPHY;  Surgeon: Nelva Bush, MD;  Location: [place] CV LAB;  Service: Cardiovascular;  Laterality: N/A;   TEE WITHOUT CARDIOVERSION N/A 11/05/2019   Procedure: TRANSESOPHAGEAL ECHOCARDIOGRAM (TEE) WITH PROPOFOL;  Surgeon: Arnoldo Lenis, MD;  [facility];  Service: Endoscopy;  Laterality: N/A;   TEE WITHOUT CARDIOVERSION N/A 05/03/2020   Procedure: TRANSESOPHAGEAL ECHOCARDIOGRAM (TEE);  Surgeon: Burnell Blanks, MD;  Location: [place];  Service: Open Heart Surgery;  Laterality: N/A;       Family History  Problem Relation Age of Onset   Heart  attack Mother    Hypertension Mother    Heart attack Father    Hypertension Father    Heart attack Brother    Colon cancer Neg Hx     Social History   Tobacco Use   Smoking status: Former    Packs/day: 1.00    Years: 10.00    Pack years: 10.00    Types: Cigarettes    Quit date: 03/26/1986    Years since quitting: 34.8   Smokeless tobacco: Never   Tobacco comments:    "stopped cigarette  smoking 1988"  Vaping Use   Vaping Use: Never used  Substance Use Topics   Alcohol use: Not Currently    Alcohol/week: 0.0 standard drinks     Comment: "quit alcohol ~ 2007"   Drug use: No    Home Medications Prior to Admission medications   Medication Sig Start Date End Date Taking? Authorizing Provider  ACCU-CHEK AVIVA PLUS test strip TEST BLOOD SUGAR TWICE DAILY BEFORE BREAKFAST AND AT BEDTIME 07/19/20   Cassandria Anger, MD  acetaminophen (TYLENOL) 325 MG tablet Take 2 tablets (650 mg total) by mouth every 4 (four) hours as needed for headache or mild pain. 05/15/20   Isaiah Serge, NP  allopurinol (ZYLOPRIM) 300 MG tablet Take 150 mg by mouth as needed.    [provider]  aspirin EC 81 MG tablet Take 1 tablet (81 mg total) by mouth daily with breakfast. 10/25/20 10/25/21  Roxan Hockey, MD  benzonatate (TESSALON) 100 MG capsule Take 1 capsule (100 mg total) by mouth 3 (three) times daily as needed for cough. 09/27/20   Evans Lance, MD  camphor-menthol (ANTI-ITCH) lotion Apply 1 application topically daily as needed for itching.    [provider]  carvedilol (COREG) 3.125 MG tablet Take 1 tablet (3.125 mg total) by mouth 2 (two) times daily with a meal. 01/30/21   [place]ford, Maricela Bo, FNP  cefdinir (OMNICEF) 300 MG capsule Take 1 capsule (300 mg total) by mouth every 12 (twelve) hours. 01/29/21   Orson Eva, MD  colchicine 0.6 MG tablet Take 1 tablet (0.6 mg total) by mouth daily as needed. 05/15/20   Isaiah Serge, NP  Dulaglutide (TRULICITY) 1.5 RX/5.4MG SOPN INJECT 1.5MG (1 PEN) SUBCUTANEOUSLY EVERY WEEK 10/05/20   Cassandria Anger, MD  ferrous sulfate 325 (65 FE) MG tablet Take 1 tablet (325 mg total) by mouth daily with breakfast. 01/29/21   Tat, Shanon Brow, MD  hydrocortisone cream 1 % Apply 1 application topically daily.    [provider]  insulin degludec (TRESIBA FLEXTOUCH) 100 UNIT/ML FlexTouch Pen Inject 40 Units into the skin at bedtime. 11/01/20   Cassandria Anger, MD  Insulin Pen Needle (BD PEN NEEDLE NANO U/F) 32G X 4 MM MISC 1 each by Does not apply route 4 (four) times daily.  12/23/18   Cassandria Anger, MD  linaclotide Rolan Lipa) 145 MCG CAPS capsule Take 1 capsule (145 mcg total) by mouth daily before breakfast. 01/26/21   Montez Morita, Quillian Quince, MD  metolazone (ZAROXOLYN) 2.5 MG tablet Take 1 tablet (2.5 mg total) by mouth once a week. Every Monday 01/30/21   Rafael Bihari, FNP  metroNIDAZOLE (FLAGYL) 500 MG tablet Take 1 tablet (500 mg total) by mouth every 12 (twelve) hours. 01/29/21   Orson Eva, MD  Multiple Vitamins-Minerals (ONE-A-DAY MENS 50+) TABS Take 1 tablet by mouth daily with breakfast. Men    [provider]  pantoprazole (PROTONIX) 40 MG tablet Take 1  tablet (40 mg total) by mouth 2 (two) times daily. 01/29/21   Orson Eva, MD  polyethylene glycol-electrolytes (TRILYTE) 420 g solution Take 4,000 mLs by mouth as directed. 02/01/21   Harvel Quale, MD  potassium chloride SA (KLOR-CON M20) 20 MEQ tablet Take 1.5 tablets (30 mEq total) by mouth 2 (two) times daily. Take 2 extra tabs on Mon with metolazone 12/12/20   Bensimhon, Shaune Pascal, MD  spironolactone (ALDACTONE) 25 MG tablet Take 0.5 tablets (12.5 mg total) by mouth daily. 07/20/20   Bensimhon, Shaune Pascal, MD  sucralfate (CARAFATE) 1 g tablet Take 1 tablet (1 g total) by mouth 4 (four) times daily. 10/22/20 10/22/21  Roxan Hockey, MD  torsemide (DEMADEX) 20 MG tablet TAKE 3 TABLETS (60 MG TOTAL) BY MOUTH 2 (TWO) TIMES DAILY. Patient taking differently: Take 20 mg by mouth 2 (two) times daily. 11/01/20   Bensimhon, Shaune Pascal, MD    Allergies    Patient has no known allergies.  Review of Systems   Review of Systems  Constitutional:  Positive for fatigue. Negative for chills and fever.  Eyes:  Negative for visual disturbance.  Respiratory:  Negative for shortness of breath.   Cardiovascular:  Negative for chest pain.  Gastrointestinal:  Positive for blood in stool. Negative for abdominal distention, abdominal pain, anal bleeding, constipation, diarrhea, nausea, rectal pain and  vomiting.  Genitourinary:  Negative for difficulty urinating, dysuria, hematuria, penile discharge, penile pain, penile swelling, scrotal swelling and testicular pain.  Musculoskeletal:  Negative for back pain and neck pain.  Skin:  Negative for color change and rash.  Neurological:  Positive for weakness (Generalized). Negative for dizziness, syncope, light-headedness and headaches.  Psychiatric/Behavioral:  Negative for confusion.    Physical Exam Updated Vital Signs BP (!) 108/53 (BP Location: Right Arm)   Pulse (!) 59   Temp 97.6 F (36.4 C) (Oral)   Resp 18   SpO2 100%   Physical Exam Vitals and nursing note reviewed. Chaperone present: Male nurse tech present as Producer, television/film/video.  Constitutional:      General: He is not in acute distress.    Appearance: He is not ill-appearing, toxic-appearing or diaphoretic.  HENT:     Head: Normocephalic.  Eyes:     General: No scleral icterus.       Right eye: No discharge.        Left eye: No discharge.  Cardiovascular:     Rate and Rhythm: Normal rate.  Pulmonary:     Effort: Pulmonary effort is normal. No tachypnea, bradypnea or respiratory distress.     Breath sounds: Normal breath sounds. No stridor.  Abdominal:     General: Abdomen is flat. Bowel sounds are normal. There is no distension. There are no signs of injury.     Palpations: Abdomen is soft. There is no mass or pulsatile mass.     Tenderness: There is no abdominal tenderness. There is no right CVA tenderness, left CVA tenderness, guarding or rebound.     Hernia: There is no hernia in the umbilical area or ventral area.  Genitourinary:    Rectum: Guaiac result positive. No mass, tenderness, anal fissure, external hemorrhoid or internal hemorrhoid. Normal anal tone.     Comments: Black stool noted in rectal vault. Skin:    General: Skin is warm and dry.     Coloration: Skin is pale.  Neurological:     General: No focal deficit present.     Mental Status: He is alert.  GCS: GCS eye subscore is 4. GCS verbal subscore is 5. GCS motor subscore is 6.  Psychiatric:        Behavior: Behavior is cooperative.    ED Results / Procedures / Treatments   Labs (all labs ordered are listed, but only abnormal results are displayed) Labs Reviewed  COMPREHENSIVE METABOLIC PANEL - Abnormal; Notable for the following components:      Result Value   Sodium 126 (*)    Potassium 5.3 (*)    Chloride 97 (*)    CO2 21 (*)    Glucose, Bld 276 (*)    BUN 98 (*)    Creatinine, Ser 2.24 (*)    Calcium 7.9 (*)    Total Protein 6.1 (*)    Albumin 2.6 (*)    Alkaline Phosphatase 176 (*)    Total Bilirubin 2.3 (*)    GFR, Estimated 30 (*)    All other components within normal limits  CBC WITH DIFFERENTIAL/PLATELET - Abnormal; Notable for the following components:   RBC 2.48 (*)    Hemoglobin 6.4 (*)    HCT 21.6 (*)    MCH 25.8 (*)    MCHC 29.6 (*)    RDW 22.6 (*)    All other components within normal limits  PROTIME-INR - Abnormal; Notable for the following components:   Prothrombin Time 20.2 (*)    INR 1.7 (*)    All other components within normal limits  POC OCCULT BLOOD, ED - Abnormal; Notable for the following components:   Fecal Occult Bld POSITIVE (*)    All other components within normal limits  RESP PANEL BY RT-PCR (FLU A&B, COVID) ARPGX2  APTT  TYPE AND SCREEN  PREPARE RBC (CROSSMATCH)    EKG EKG Interpretation  Date/Time:  Monday February 06 2021 14:05:04 EST Ventricular Rate:  60 PR Interval:    QRS Duration: 172 QT Interval:  524 QTC Calculation: 524 R Axis:   -85 Text Interpretation: Junctional rhythm IVCD, consider atypical RBBB Probable inferior infarct, acute Anterior infarct, old No significant change since last tracing Confirmed by Varney Biles [phone]) on 02/06/2021 2:14:09 PM  Radiology No results found.  Procedures .Critical Care Performed by: Loni Beckwith, PA-C Authorized by: Loni Beckwith, PA-C   Critical  care provider statement:    Critical care time (minutes):  30   Critical care was necessary to treat or prevent imminent or life-threatening deterioration of the following conditions:  Circulatory failure   Critical care was time spent personally by me on the following activities:  Development of treatment plan with patient or surrogate, discussions with consultants, evaluation of patient's response to treatment, examination of patient, ordering and review of laboratory studies, ordering and review of radiographic studies, ordering and performing treatments and interventions, pulse oximetry, re-evaluation of patient's condition and review of old charts   Care discussed with: admitting provider     Medications Ordered in ED Medications  0.9 %  sodium chloride infusion (Manually program via Guardrails IV Fluids) (has no administration in time range)  calcium gluconate 1 g/ 50 mL sodium chloride IVPB (has no administration in time range)  pantoprazole (PROTONIX) injection 80 mg (has no administration in time range)  0.9 %  sodium chloride infusion (has no administration in time range)    ED Course  I have reviewed the triage vital signs and the nursing notes.  Pertinent labs & imaging results that were available during my care of the patient were reviewed by me and  considered in my medical decision making (see chart for details).  Clinical Course as of 02/07/21 0131  Mon Feb 06, 2021  1341 Informed by RN that hemoglobin was 6.4.  We will initiate blood transfusion at this time. [PB]  2876 Spoke to hospitalist Dr.Shahmehdi who will see the patient for admission. [PB]  8115 Procedure gastroenterologist Dr. Abbey Chatters will come to see the patient in the emergency department.  We will hold ordering any CT scans until seen by gastroenterology. [PB]    Clinical Course User Index [PB] Dyann Ruddle   MDM Rules/Calculators/A&P                           Alert 75 year old male no acute  stress, nontoxic-appearing.  Presents to ED with chief complaint of melena.  Patient has history of the same.  Was recently admitted to the hospital on 11/3 for diverticulitis.  Patient had EGD performed at that hospitalization which showed no source of bleeding.  Has colonoscopy scheduled for 11/22.  Additionally patient endorses generalized weakness and fatigue since being discharged from the hospital on 11/6.  On exam abdomen soft, nondistended, nontender no rebound tenderness or guarding.  Patient noted to have black stool in the rectal vault which was Hemoccult positive.  Will order type and screen, CBC, CMP, INR, PTT. CBC shows anemia with hemoglobin 6.4 and hematocrit 21.6.  This is decreased from hemoglobin of 8.0 obtained 1 week prior.  Discussed blood transfusion with patient at this time who is agreeable.  Will transfuse 2 units.  Patient to receive 80 mg Protonix.  CMP shows sodium 126, this is corrected to 130 due to hyperglycemia at 276.  Patient's potassium elevated at 5.3, will obtain EKG and give patient calcium gluconate.  Creatinine slightly increased at 2.24, slightly above patient's baseline we will start patient on gentle hydration.  Will consult with gastroenterology and hospitalist team.  Patient care discussed with attending physician Dr. Kathrynn Humble.  Final Clinical Impression(s) / ED Diagnoses Final diagnoses:  None    Rx / DC Orders ED Discharge Orders     None        Loni Beckwith, PA-C 02/07/21 0131    Varney Biles, MD 02/08/21 1254

## 2021-02-07 ENCOUNTER — Encounter (HOSPITAL_COMMUNITY)
Admission: EM | Disposition: A | Discharge status: Home Health | Payer: Self-pay | Source: Home / Self Care | Attending: Internal Medicine

## 2021-02-07 DIAGNOSIS — K922 Gastrointestinal hemorrhage, unspecified: Secondary | ICD-10-CM | POA: Diagnosis not present

## 2021-02-07 DIAGNOSIS — K921 Melena: Secondary | ICD-10-CM

## 2021-02-07 HISTORY — PX: GIVENS CAPSULE STUDY: SHX5432

## 2021-02-07 LAB — CBC
HCT: 25.1 % — ABNORMAL LOW (ref 39.0–52.0)
Hemoglobin: 8.4 g/dL — ABNORMAL LOW (ref 13.0–17.0)
MCH: 27.6 pg (ref 26.0–34.0)
MCHC: 33.5 g/dL (ref 30.0–36.0)
MCV: 82.6 fL (ref 80.0–100.0)
Platelets: 189 10*3/uL (ref 150–400)
RBC: 3.04 MIL/uL — ABNORMAL LOW (ref 4.22–5.81)
RDW: 21.2 % — ABNORMAL HIGH (ref 11.5–15.5)
WBC: 11.7 10*3/uL — ABNORMAL HIGH (ref 4.0–10.5)
nRBC: 0.2 % (ref 0.0–0.2)

## 2021-02-07 LAB — GLUCOSE, CAPILLARY
Glucose-Capillary: 193 mg/dL — ABNORMAL HIGH (ref 70–99)
Glucose-Capillary: 187 mg/dL — ABNORMAL HIGH (ref 70–99)
Glucose-Capillary: 191 mg/dL — ABNORMAL HIGH (ref 70–99)
Glucose-Capillary: 220 mg/dL — ABNORMAL HIGH (ref 70–99)

## 2021-02-07 LAB — PROTIME-INR
INR: 1.5 — ABNORMAL HIGH (ref 0.8–1.2)
Prothrombin Time: 18.3 seconds — ABNORMAL HIGH (ref 11.4–15.2)

## 2021-02-07 LAB — TROPONIN I (HIGH SENSITIVITY)
Troponin I (High Sensitivity): 74 ng/L — ABNORMAL HIGH (ref <18)
Troponin I (High Sensitivity): 69 ng/L — ABNORMAL HIGH (ref <18)

## 2021-02-07 LAB — BASIC METABOLIC PANEL
Anion gap: 10 (ref 5–15)
BUN: 111 mg/dL — ABNORMAL HIGH (ref 8–23)
CO2: 23 mmol/L (ref 22–32)
Calcium: 8.8 mg/dL — ABNORMAL LOW (ref 8.9–10.3)
Chloride: 100 mmol/L (ref 98–111)
Creatinine, Ser: 1.99 mg/dL — ABNORMAL HIGH (ref 0.61–1.24)
GFR, Estimated: 34 mL/min — ABNORMAL LOW (ref >60)
Glucose, Bld: 167 mg/dL — ABNORMAL HIGH (ref 70–99)
Potassium: 3.6 mmol/L (ref 3.5–5.1)
Sodium: 133 mmol/L — ABNORMAL LOW (ref 135–145)

## 2021-02-07 SURGERY — IMAGING PROCEDURE, GI TRACT, INTRALUMINAL, VIA CAPSULE

## 2021-02-07 MED ORDER — SODIUM CHLORIDE 0.9 % IV BOLUS
500.0000 mL | Freq: Once | INTRAVENOUS | Status: AC
  Administered 2021-02-07: 500 mL via INTRAVENOUS

## 2021-02-07 MED ORDER — SODIUM CHLORIDE 0.9 % IV SOLN: INTRAVENOUS | Status: DC

## 2021-02-07 MED ORDER — HYDROXYZINE HCL 10 MG PO TABS
10.0000 mg | ORAL_TABLET | ORAL | Status: DC | PRN
  Administered 2021-02-07 – 2021-02-10 (×6): 10 mg via ORAL
  Filled 2021-02-07 (×6): qty 1

## 2021-02-07 MED ORDER — HYDROCORTISONE 1 % EX CREA
1.0000 "application " | TOPICAL_CREAM | Freq: Four times a day (QID) | CUTANEOUS | Status: DC
  Administered 2021-02-07 – 2021-02-11 (×13): 1 via TOPICAL
  Filled 2021-02-07: qty 28

## 2021-02-07 MED ORDER — CHLORHEXIDINE GLUCONATE CLOTH 2 % EX PADS
6.0000 | MEDICATED_PAD | Freq: Every day | CUTANEOUS | Status: DC
  Administered 2021-02-10 – 2021-02-11 (×2): 6 via TOPICAL

## 2021-02-07 MED ORDER — ALBUMIN HUMAN 25 % IV SOLN
50.0000 g | Freq: Once | INTRAVENOUS | Status: AC
  Administered 2021-02-07: 50 g via INTRAVENOUS
  Filled 2021-02-07: qty 200

## 2021-02-07 MED ORDER — URSODIOL 300 MG PO CAPS
600.0000 mg | ORAL_CAPSULE | Freq: Two times a day (BID) | ORAL | Status: DC
  Filled 2021-02-07 (×3): qty 2

## 2021-02-07 NOTE — Plan of Care (Signed)
  Problem: Acute Rehab OT Goals (only OT should resolve) Goal: Pt. Will Perform Grooming Flowsheets (Taken 02/07/2021 1019) Pt Will Perform Grooming:  standing  with modified independence  with adaptive equipment Goal: Pt. Will Perform Lower Body Dressing Flowsheets (Taken 02/07/2021 1019) Pt Will Perform Lower Body Dressing:  with min assist  sitting/lateral leans  with adaptive equipment Goal: Pt. Will Transfer To Toilet [place] (Taken 02/07/2021 1019) Pt Will Transfer to Toilet:  with modified independence  stand pivot transfer  ambulating Goal: Pt/Caregiver Will Perform Home Exercise Program Flowsheets (Taken 02/07/2021 1019) Pt/caregiver will Perform Home Exercise Program:  Increased ROM  Increased strength  Right Upper extremity  Left upper extremity  With Supervision  Jakyri Brunkhorst OT, MOT

## 2021-02-07 NOTE — Progress Notes (Signed)
Attempted to scan patient and then the blood product and it stated it was not a match to the lab. This RN called lab to inquire about this and Bobbie from lab states to use a downtime form. This RN and another RN, Celinia verified correct patient and blood type.

## 2021-02-07 NOTE — Progress Notes (Signed)
Subjective: States he is feeling much better this morning. No BM today. Last melanotic stool was prior to coming to the hospital.  Denies abdominal pain, nausea, vomiting.  Denies mental status changes/confusion. No peripheral edema or abdominal distension. Swallow capsule this morning without any problems.  Objective: Vital signs in last 24 hours: Temp:  [97.6 F (36.4 C)-98.9 F (37.2 C)] 98.9 F (37.2 C) (11/15 1116) Pulse Rate:  [49-76] 58 (11/15 1116) Resp:  [11-25] 14 (11/15 1116) BP: (102-121)/(26-71) 108/39 (11/15 0700) SpO2:  [99 %-100 %] 99 % (11/15 1116) Weight:  [93.9 kg-96.4 kg] 93.9 kg (11/15 0500) Last BM Date: 02/06/21 General:   Alert and oriented, pleasant, NAD.  Head:  Normocephalic and atraumatic. Eyes:  No icterus, sclera clear. Conjuctiva pink.  Abdomen:  Bowel sounds present, soft, non-tender, non-distended. No HSM or hernias noted. No rebound or guarding. No masses appreciated  Msk:  Symmetrical without gross deformities. Normal posture. Extremities:  Without edema. Neurologic:  Alert and  oriented x4;  grossly normal neurologically. Skin:  Warm and dry, intact without significant lesions.  Psych:  Normal mood and affect.  Intake/Output from previous day: 11/14 0701 - 11/15 0700 In: 710.6 [Blood:325; IV Piggyback:385.6] Out: 4850 [Urine:4850] Intake/Output this shift: Total I/O In: 750 [I.V.:125; Blood:625] Out: -   Lab Results: Recent Labs    02/06/21 1300 02/07/21 0957  WBC 8.1 11.7*  HGB 6.4* 8.4*  HCT 21.6* 25.1*  PLT 176 189   BMET Recent Labs    02/06/21 1300 02/07/21 0957  NA 126* 133*  K 5.3* 3.6  CL 97* 100  CO2 21* 23  GLUCOSE 276* 167*  BUN 98* 111*  CREATININE 2.24* 1.99*  CALCIUM 7.9* 8.8*   LFT Recent Labs    02/06/21 1300  PROT 6.1*  ALBUMIN 2.6*  AST 27  ALT 16  ALKPHOS 176*  BILITOT 2.3*   PT/INR Recent Labs    02/06/21 1300 02/07/21 0957  LABPROT 20.2* 18.3*  INR 1.7* 1.5*      Assessment: 75 y.o. year old male with a history of NASH cirrhosis (MELD Na 29 on admission), duodenal ulcer s/p embolization in July 2022 and surveillance EGD 01/27/21 with healed ulcer and portal gastropathy, history of diverticulitis 01/26/21 while inpatient, IDA, returning with symptomatic anemia and reported melena.   Acute blood loss anemia with melena: Stool heme positive.  Hemoglobin 6.4 on admission, down from 8 last week.  Now s/p 2 unit PRBCs with hemoglobin improved to 8.4 today.  Denies any further overt GI bleeding since admission.  Recent EGD 01/27/2021 as per above.  He swallowed Givens capsule today to evaluate for small bowel bleeding. This will be read tomorrow.  In light of GI bleeding and cirrhosis, he is on Rocephin for SBP prophylaxis.  No need for octreotide as he does not have varices.  Ultimately, he will need a colonoscopy, but preferred to hold off for a few more weeks due to recent episode of diverticulitis.  CKD: Worsening creatinine from baseline on admission, improved somewhat today.    Hyponatremia: Improving, management per hospitalist.   Hyperkalemia: Corrected. Management per hospitalist.    History of diverticulitis: CT abd/pelvis without contrast on 11/3 with mild uncomplicated sigmoid diverticulitis. Clinically improved. Abdominal exam benign. Last colonoscopy 2019 with diverticulosis and tubular adenomas. With recent bout of diverticulitis, will hold off on colonoscopy for a few more weeks unless clinical condition changes.    Plan: Givens capsule to be read tomorrow.  Further recommendations  to follow. Continue to hold iron for now. Continue IV PPI twice daily. Continue IV antibiotics for SBP prophylaxis. Monitor H&H and transfuse for hemoglobin less than 7. Monitor CBC, CMP, INR daily.   LOS: 1 day    02/07/2021, 11:50 AM   Aliene Altes, PA-C [facility]

## 2021-02-07 NOTE — Progress Notes (Signed)
UNMATCHED BLOOD PRODUCT NOTE  Compare the patient ID on the blood tag to the patient ID on the hospital armband and Blood Bank armband. Then confirm the unit number on the blood tag matches the unit number on the blood product.  If a discrepancy is discovered return the product to blood bank immediately.   Blood Product Type: Packed Red Blood Cells  Unit #: (Found on blood product bag, begins with W) [id number]  Product Code #: (Found on blood product bag, begins with E) [id number]   Start Time: 0230  Starting Rate: 125 ml/hr  Rate increase/decreased  (if applicable):      ml/hr increased to 133m/hr  Rate changed time (if applicable): 06148  Stop Time: 0430    All Other Documentation should be documented within the Blood Admin Flowsheet per policy.

## 2021-02-07 NOTE — Evaluation (Addendum)
Physical Therapy Evaluation Patient Details Name: John [name] MRN: [id number] DOB: May 17, 1945 Today's Date: 02/07/2021  History of Present Illness  John [name] is a 75 y.o. male with medical history significant of systolic and diastolic CHF, DM 2, HTN, NASH cirrhosis, CKD stage IIIb, heart block status post PPM, HLD, persistent A. fib, off chronic anticoagulation due to GI bleed, upper GI bleed-ulcer, diverticulitis  Presented today with chief complaint of abdominal, black stool, 4 episodes this morning.  Some shortness of breath, when laying down, edema bilateral lower extremity.  On Zaroxolyn q. Mondays and torsemide twice a day.   Clinical Impression  Patient in bed awake, alert, slightly lethargic, and agreeable for therapy after having a blood transfusion, w/ wife and son at bedside.  Patient transitioned from supine w/ HOB flat to sitting EOB w/ feet flat on the floor, required verbal cuing for hand placement, demonstrated labored movement, no c/o of dizziness. Patient performed a sit to stand transfer using a RW w/ slow labored movement, required Min assist to stand. Patient ambulated in room and hallway using a RW, demonstrated a slow steady cadence w/ steadiness on feet, required a short standing rest break d/t fatigue after ambulating over 40 feet. Patient required 3 more standing rest breaks w/ increased time and started to c/o L sided chest pain before reaching his chair in his room to sit down. While sitting, patient reported feeling nauseas, c/o chest pain, and started to sweat -nurse was notified and examined patient. Therapist assisted patient back into bed. Patient was left in bed w/ nursing staff and his wife present. Patient will benefit from continued skilled physical therapy in hospital and recommended venue below to increase strength, balance, endurance for safe ADLs and gait.        Recommendations for follow up therapy are one component of a multi-disciplinary discharge  planning process, led by the attending physician.  Recommendations may be updated based on patient status, additional functional criteria and insurance authorization.  Follow Up Recommendations Home health PT    Assistance Recommended at Discharge Intermittent Supervision/Assistance  Functional Status Assessment Patient has had a recent decline in their functional status and demonstrates the ability to make significant improvements in function in a reasonable and predictable amount of time.  Equipment Recommendations  None recommended by PT    Recommendations for Other Services       Precautions / Restrictions Precautions Precautions: Fall Restrictions Weight Bearing Restrictions: No      Mobility  Bed Mobility Overal bed mobility: Needs Assistance Bed Mobility: Supine to Sit;Sit to Supine     Supine to sit: Min assist Sit to supine: Min assist   General bed mobility comments: Demonstrated labored movement, required increased time, and verbal cues for hand placement.    Transfers Overall transfer level: Needs assistance Equipment used: Rolling walker (2 wheels) Transfers: Sit to/from Stand;Bed to chair/wheelchair/BSC Sit to Stand: Min assist Stand pivot transfers: Min assist;Min guard         General transfer comment: Slow labored movement with use of RW.    Ambulation/Gait Ambulation/Gait assistance: Min guard;Min assist Gait Distance (Feet): 80 Feet Assistive device: Rolling walker (2 wheels) Gait Pattern/deviations: Decreased step length - right;Decreased step length - left;Decreased stride length Gait velocity: Decreased     General Gait Details: Demonstrated steadiness on feet, no near loss of balance, required rest breaks, started to complain of chest pain.  Stairs            Emergency planning/management officer  Modified Rankin (Stroke Patients Only)       Balance Overall balance assessment: Needs assistance Sitting-balance support: No upper extremity  supported;Feet supported Sitting balance-Leahy Scale: Good Sitting balance - Comments: fair/good seated at EOB   Standing balance support: Bilateral upper extremity supported;During functional activity;Reliant on assistive device for balance Standing balance-Leahy Scale: Fair Standing balance comment: fair/good using RW until patient started experiencing chest pain and fatigue.                             Pertinent Vitals/Pain Pain Assessment: Faces Faces Pain Scale: Hurts even more Pain Location: L chest pain Pain Descriptors / Indicators: Tightness Pain Intervention(s): Limited activity within patient's tolerance;Monitored during session    Home Living Family/patient expects to be discharged to:: Private residence Living Arrangements: Spouse/significant other Available Help at Discharge: Family;Available 24 hours/day Type of Home: House Home Access: Stairs to enter Entrance Stairs-Rails: None Entrance Stairs-Number of Steps: 1 Alternate Level Stairs-Number of Steps: 13 steps to basement Home Layout: Able to live on main level with bedroom/bathroom;Two level;Laundry or work area in basement (Patient only has one step into house, does not go into basement often.) Home Equipment: Conservation officer, nature (2 wheels);Shower seat - built in;Grab bars - tub/shower      Prior Function Prior Level of Function : Needs assist       Physical Assist : ADLs (physical)   ADLs (physical): Bathing;Dressing;IADLs Mobility Comments: Modified household ambulator using RW. ADLs Comments: Assisted by wife for ADL's.     Hand Dominance   Dominant Hand: Right    Extremity/Trunk Assessment   Upper Extremity Assessment Upper Extremity Assessment: Defer to OT evaluation RUE Deficits / Details: Pt reports limited shoulder use from prvious injury. Pt able to functionally use R UE with RW. LUE Deficits / Details: Pt reports no deficits and was able to functionally use L UE with RW. Limited  assessment due to pt reports of chest pain and nursing intervening.    Lower Extremity Assessment Lower Extremity Assessment: Generalized weakness    Cervical / Trunk Assessment Cervical / Trunk Assessment: Normal  Communication   Communication: No difficulties  Cognition Arousal/Alertness: Awake/alert Behavior During Therapy: WFL for tasks assessed/performed Overall Cognitive Status: Within Functional Limits for tasks assessed                                          General Comments      Exercises     Assessment/Plan    PT Assessment Patient needs continued PT services  PT Problem List Decreased strength;Decreased mobility;Decreased safety awareness;Decreased range of motion;Decreased coordination;Decreased activity tolerance;Decreased balance;Decreased knowledge of use of DME       PT Treatment Interventions DME instruction;Therapeutic exercise;Gait training;Balance training;Stair training;Functional mobility training;Therapeutic activities;Patient/family education    PT Goals (Current goals can be found in the Care Plan section)  Acute Rehab PT Goals Patient Stated Goal: Return home PT Goal Formulation: With patient/family Time For Goal Achievement: 02/10/21 Potential to Achieve Goals: Good    Frequency Min 3X/week   Barriers to discharge        Co-evaluation PT/OT/SLP Co-Evaluation/Treatment: Yes Reason for Co-Treatment: To address functional/ADL transfers PT goals addressed during session: Mobility/safety with mobility OT goals addressed during session: ADL's and self-care       AM-PAC PT "6 Clicks" Mobility  Outcome Measure Help needed  turning from your back to your side while in a flat bed without using bedrails?: A Little Help needed moving from lying on your back to sitting on the side of a flat bed without using bedrails?: A Little Help needed moving to and from a bed to a chair (including a wheelchair)?: A Little Help needed  standing up from a chair using your arms (e.g., wheelchair or bedside chair)?: A Little Help needed to walk in hospital room?: A Little Help needed climbing 3-5 steps with a railing? : A Lot 6 Click Score: 17    End of Session Equipment Utilized During Treatment: Gait belt Activity Tolerance: Patient tolerated treatment well;Patient limited by fatigue;Patient limited by pain Patient left: in bed;with call bell/phone within reach;with nursing/sitter in room;with family/visitor present Nurse Communication: Mobility status PT Visit Diagnosis: Unsteadiness on feet (R26.81);Other abnormalities of gait and mobility (R26.89);Muscle weakness (generalized) (M62.81)    Time: 1601-0932 PT Time Calculation (min) (ACUTE ONLY): 32 min   Charges:   PT Evaluation $PT Eval Moderate Complexity: 1 Mod PT Treatments $Therapeutic Activity: 23-37 mins        Cassie Jones, SPT  During this treatment session, the therapist was present, participating in and directing the treatment.  1:54 PM, 02/07/21 Lonell Grandchild, MPT Physical Therapist with [facility] [phone] office [phone] mobile phone

## 2021-02-07 NOTE — Evaluation (Signed)
Occupational Therapy Evaluation Patient Details Name: John [name] MRN: [id number] DOB: Mar 23, 1946 Today's Date: 02/07/2021   History of Present Illness John [name] is a 75 y.o. male with medical history significant of systolic and diastolic CHF, DM 2, HTN, NASH cirrhosis, CKD stage IIIb, heart block status post PPM, HLD, persistent A. fib, off chronic anticoagulation due to GI bleed, upper GI bleed-ulcer, diverticulitis  Presented today with chief complaint of abdominal, black stool, 4 episodes this morning.  Some shortness of breath, when laying down, edema bilateral lower extremity.  On Zaroxolyn q. Mondays and torsemide twice a day.   Clinical Impression   Pt agreeable to OT/PT co-evaluation. Pt assisted at baseline for ADL's and IADL's by wife. Pt uses RW for household ambulation. This date pt required min A for supine to sit bed mobility and sit to stand from EOB and chair. Once up pt required min G to min A with use of RW for transfers and functional ambulation. Pt ambulated in hall with reports of fatigue and L side chest pain upon return to room. Pt able to make back to chair. Nursing was notified and began to assess the pt. Pt transferred to bed with assist and was left in care of nursing staff. Pt's wife reported she felt comfortable provided the amount of assist that was required today at home with the pt. Pt will benefit from continued OT in the hospital and recommended venue below to increase strength, balance, and endurance for safe ADL's.        Recommendations for follow up therapy are one component of a multi-disciplinary discharge planning process, led by the attending physician.  Recommendations may be updated based on patient status, additional functional criteria and insurance authorization.   Follow Up Recommendations  Home health OT    Assistance Recommended at Discharge Intermittent Supervision/Assistance  Functional Status Assessment  Patient has had a recent  decline in their functional status and demonstrates the ability to make significant improvements in function in a reasonable and predictable amount of time.  Equipment Recommendations  None recommended by OT           Precautions / Restrictions Precautions Precautions: Fall Restrictions Weight Bearing Restrictions: No      Mobility Bed Mobility Overal bed mobility: Needs Assistance Bed Mobility: Supine to Sit;Sit to Supine     Supine to sit: Min assist Sit to supine: Min assist   General bed mobility comments: labored movement    Transfers Overall transfer level: Needs assistance Equipment used: Rolling walker (2 wheels) Transfers: Sit to/from Stand;Bed to chair/wheelchair/BSC Sit to Stand: Min assist Stand pivot transfers: Min assist;Min guard         General transfer comment: Slow labored movement with use of RW.      Balance Overall balance assessment: Needs assistance Sitting-balance support: No upper extremity supported;Feet supported Sitting balance-Leahy Scale: Good Sitting balance - Comments: fair to good seated at EOB   Standing balance support: Bilateral upper extremity supported;During functional activity;Reliant on assistive device for balance Standing balance-Leahy Scale: Fair Standing balance comment: poor to fair using RW                           ADL either performed or assessed with clinical judgement   ADL Overall ADL's : Needs assistance/impaired                     Lower Body Dressing: Maximal assistance;Total assistance;Sitting/lateral leans  Lower Body Dressing Details (indicate cue type and reason): Assisted to doff and don socks seated in chair. Toilet Transfer: Minimal assistance;Stand-pivot;Rolling walker (2 wheels) Toilet Transfer Details (indicate cue type and reason): simulated via EOB to chair transfer         Functional mobility during ADLs: Min guard;Minimal assistance;Rolling walker (2 wheels) General  ADL Comments: Pt became fatigued and reported chest pain after functional ambulation in the hall. Prior to this pt requried min G to min A with RW.     Vision Baseline Vision/History: 1 Wears glasses Ability to See in Adequate Light: 1 Impaired Patient Visual Report: No change from baseline Vision Assessment?: No apparent visual deficits (Other than baseline report)                Pertinent Vitals/Pain Pain Assessment: Faces Faces Pain Scale: Hurts even more Pain Location: L chest pain Pain Descriptors / Indicators: Tightness Pain Intervention(s): Limited activity within patient's tolerance;Monitored during session;Other (comment) (Nursing notified)     Hand Dominance Right   Extremity/Trunk Assessment Upper Extremity Assessment Upper Extremity Assessment: RUE deficits/detail;LUE deficits/detail RUE Deficits / Details: Pt reports limited shoulder use from prvious injury. Pt able to functionally use R UE with RW. LUE Deficits / Details: Pt reports no deficits and was able to functionally use L UE with RW. Limited assessment due to pt reports of chest pain and nursing intervening.   Lower Extremity Assessment Lower Extremity Assessment: Defer to PT evaluation   Cervical / Trunk Assessment Cervical / Trunk Assessment: Normal   Communication Communication Communication: No difficulties   Cognition Arousal/Alertness: Awake/alert Behavior During Therapy: WFL for tasks assessed/performed Overall Cognitive Status: Within Functional Limits for tasks assessed                                                        Home Living Family/patient expects to be discharged to:: Private residence Living Arrangements: Spouse/significant other Available Help at Discharge: Family;Available 24 hours/day Type of Home: House Home Access: Stairs to enter [employer] of Steps: 1 Entrance Stairs-Rails: None Home Layout: Able to live on main level with  bedroom/bathroom;Two level;Laundry or work area in basement Alternate Therapist, sports of Steps: 13 steps to basement Alternate Level Stairs-Rails: Right (going down) Bathroom Shower/Tub: Occupational psychologist: Handicapped height     Home Equipment: Conservation officer, nature (2 wheels);Shower seat - built in;Grab bars - tub/shower          Prior Functioning/Environment Prior Level of Function : Needs assist       Physical Assist : ADLs (physical)   ADLs (physical): Bathing;Dressing;IADLs Mobility Comments: Pt reports household ambulation with RW without need for additional support. ADLs Comments: Pt reports assist for ADL's from wife. Wife also assists with IADL's.        OT Problem List: Decreased strength;Decreased activity tolerance;Decreased range of motion;Impaired balance (sitting and/or standing)      OT Treatment/Interventions: Self-care/ADL training;Therapeutic exercise;Therapeutic activities;Patient/family education;Balance training    OT Goals(Current goals can be found in the care plan section) Acute Rehab OT Goals Patient Stated Goal: return home OT Goal Formulation: With patient Time For Goal Achievement: 02/21/21 Potential to Achieve Goals: Good  OT Frequency: Min 2X/week               Co-evaluation PT/OT/SLP Co-Evaluation/Treatment: Yes Reason  for Co-Treatment: To address functional/ADL transfers   OT goals addressed during session: ADL's and self-care      AM-PAC OT "6 Clicks" Daily Activity     Outcome Measure                 End of Session Equipment Utilized During Treatment: Rolling walker (2 wheels) Nurse Communication: Other (comment) (Informed of pt report of chest pain.)  Activity Tolerance: Patient limited by pain Patient left: in bed;with call bell/phone within reach;with family/visitor present;with nursing/sitter in room;Other (comment) (Nursing in room assisting pt due to report of chest pain.)  OT Visit Diagnosis:  Unsteadiness on feet (R26.81);Other abnormalities of gait and mobility (R26.89);Muscle weakness (generalized) (M62.81)                Time: 1102-1117 OT Time Calculation (min): 37 min Charges:  OT General Charges $OT Visit: 1 Visit OT Evaluation $OT Eval Moderate Complexity: 1 Mod  Alexanderjames Berg OT, MOT  Larey Seat 02/07/2021, 10:16 AM

## 2021-02-07 NOTE — Progress Notes (Signed)
UNMATCHED BLOOD PRODUCT NOTE  Compare the patient ID on the blood tag to the patient ID on the hospital armband and Blood Bank armband. Then confirm the unit number on the blood tag matches the unit number on the blood product.  If a discrepancy is discovered return the product to blood bank immediately.   Blood Product Type: Packed Red Blood Cells  Unit #: (Found on blood product bag, begins with W) [id number]  Product Code #: (Found on blood product bag, begins with E) [id number]   Start Time: 0630  Starting Rate: 125 ml/hr  Rate increase/decreased  (if applicable):      ml/hr  Rate changed time (if applicable):    Stop Time: 0830    All Other Documentation should be documented within the Blood Admin Flowsheet per policy.

## 2021-02-07 NOTE — Progress Notes (Signed)
Patient requested something else for itching, Sarna lotion helped a little bit but still itching, wiped down in cold rags & dropped temp in his room, also holding off on CHG bath just to see if that's causing it or possibly making it worse, MD notified

## 2021-02-07 NOTE — Progress Notes (Signed)
PROGRESS NOTE    Patient: John [name]                            PCP: [name], MD                    DOB: 1945-06-07            DOA: 02/06/2021 YIR:[id number]             DOS: 02/07/2021, 3:12 PM   LOS: 1 day   Date of Service: The patient was seen and examined on 02/07/2021  Subjective:   The patient was seen and examined this morning. Hemodynamically stable this morning, no acute distress No itching  Present at bedside  Brief Narrative:      John [name] is a 75 y.o. male with medical history significant of systolic and diastolic CHF, DM 2, HTN, NASH cirrhosis, CKD stage IIIb, heart block status post PPM, HLD, persistent A. fib, off chronic anticoagulation due to GI bleed, upper GI bleed-ulcer, diverticulitis Presented today with chief complaint of abdominal, black stool, 4 episodes this morning.  Some shortness of breath, when laying down, edema bilateral lower extremity. On Zaroxolyn q. Mondays and torsemide twice a day.   Recent hospitalization from 11/3/ - 11/6  for GI bleed, diagnosed with diverticulitis, duodenal ulcer    Assessment & Plan:   Principal Problem:   Acute GI bleeding Active Problems:   DM type 2 causing vascular disease ([place])   Mixed hyperlipidemia   Essential hypertension, benign   Atrial flutter (HCC)   CKD (chronic kidney disease) stage 3, GFR 30-59 ml/min (HCC)   Paroxysmal atrial fibrillation (HCC)   Hyponatremia   Acute on chronic combined systolic and diastolic CHF (congestive heart failure) (HCC)   Severe aortic stenosis   S/P TAVR (transcatheter aortic valve replacement)   Anemia   Melena   Diverticulitis of colon   Acute GI bleeding -Status post 2 units of PRBC transfusion tolerated well overnight -Hemoglobin 6.4 >> 2U PRBC>> 8.4 today -Hemoccult positive x 2  -Recent EGD revealing healing duodenal ulcer, diverticulitis on last admission -s/p embolization in July 2022 and surveillance EGD 01/27/21 with healed ulcer  and portal gastropathy, history of diverticulitis 01/26/21 while inpatient, -Started on Protonix drip,  -Gastroenterologist consulted -appreciate further evaluation and recommendations Initiated capsule endoscopy 02/07/2021    EGD 01/27/21: small hiatal hernia, non-obstructing Schatzki's ring, portal gastropathy, duodenal scar indicative of healed prior ulcer    -Clear liquid diet   Hyponatremia -Likely due to hypovolemia, -Last sodium  126 >>>   133 this a.m. --Monitoring closely   Hyperkalemia/hypocalcemia -Potassium 5.3, calcium 7.9, albumin 2.6 >>> calcium 8.8,  Repeating albumin -Monitoring correcting and repleting accordingly     Active Problems:      Diagnosis of diverticulitis -on 01/27/2021 -Last admission was treated with Rocephin and Flagyl IV will switch to cefdinir metronidazole For 7 more days postdischarge,... Apparently completed 02/03/2021 -We will await CT scan, -Antibiotics for now     Acute blood loss anemia Due to recurrent GI bleed, Hemoccult positive -Baseline hemoglobin ~9 (hemoglobin 8.0 on 11 /7) -Hemoglobin 6.4 >>> 2U PRBC initiated in ED >>> 8.4 today   -likely due to "oozing" from patient's portal hypertensive gastropathy--see below -7/25 to 7/30 admit with UGI bleed -7/26 EGD--cratered duodenal ulcer with visible blood vessel status post epi injection and unsuccessful hemoclips.  The patient required IR embolization during that  hospitalization.   -11/4 EGD-- nonobstructive schatzki ring; portal HTN gastropathy; 20 mm healed ulcer in duodenal bulb without stigmata of bleeding   -Gastroenterologist consulted, appreciate input   Acute on chronic systolic and diastolic CHF -Patient appears hypovolemic on arrival, transfusing 2U PRBC, Monitoring BUN/creatinine and volume status monitoring I's and O's   -Restarting home medication of torsemide 20 mg p.o. twice daily in a.m. -10/7-10/10 admit for acute on chronic combined CHF--discharge weight 94.8  kg -01/26/2021 admission weight 97.5 kg -Spouse states that patient has gained about 5 pounds in the past 2 weeks -Start IV furosemide>>restart dose torsemide -Daily weights -11/05/2019 TEE EF 35-40%, mild decreased RV function, moderate TR/MR, global HK -01/01/2021 echo EF 30-35%, global HK, low normal RV, mod to moderate MR, moderate TR   Remained stable, monitoring   CKD stage IIIb -Baseline creatinine 1.8-2.0 -Creatinine on last discharge 2.01, >>  2.24 >> 1.99 -Avoiding nephrotoxin, -We will monitor BUN/creatinine   Persistent atrial fibrillation -Patient previously on anticoagulation but this has been discontinued secondary to GI bleed and cratered duodenal ulcer with visible vessel -Well-controlled   Diabetes mellitus type 2, uncontrolled with hyperglycemia -We will check CBG q. ACH S, with slight scale insulin -Holding Trulicity and Tresiba -96/0/4540 hemoglobin A1c 9.2   Complete heart block -Cardiac meds, status post pacemaker placement   Hyperlipidemia -[place]   S/p TAVR -04/2020 -Stable   Goals of Care -DNR--confirmed with spouse and patient     Cultures:  -none  Antimicrobial: -Omnicef Flagyl, home medication--was completed recently   Consults called: GI  -------------------------------------------------------------------------------------------------------------------------------------------- DVT prophylaxis: SCD/Compression stockings Code Status:   Code Status: Full Code     Admission status: Patient will be admitted as Inpatient, with a greater than 2 midnight length of stay. Level of care: Stepdown     Family Communication: Wife present at bedside (The above findings and plan of care has been discussed with patient in detail, the patient expressed understanding and agreement of above plan)  --------------------------------------------------------------------------------------------------------------------------------------------------    Disposition Plan: From home Likely be discharged >3 days home with home health Status is: Inpatient   Remains inpatient appropriate because: Ongoing symptoms due to symptomatic anemia, requiring blood transfusions, IV fluids, due to acute on chronic kidney disease, electrolyte abnormalities, multiple call Reddys including CHF.       Level of care: Stepdown   Procedures:   No admission procedures for hospital encounter.  Capsule endoscopy swallowed 02/07/2021 in a.m.  Antimicrobials:  Anti-infectives (From admission, onward)    Start     Dose/Rate Route Frequency Ordered Stop   02/06/21 1615  cefTRIAXone (ROCEPHIN) 2 g in sodium chloride 0.9 % 100 mL IVPB        2 g 200 mL/hr over 30 Minutes Intravenous Every 24 hours 02/06/21 1614     02/06/21 1445  cefdinir (OMNICEF) capsule 300 mg  Status:  Discontinued        300 mg Oral Every 12 hours 02/06/21 1441 02/06/21 1513   02/06/21 1445  metroNIDAZOLE (FLAGYL) tablet 500 mg  Status:  Discontinued        500 mg Oral Every 12 hours 02/06/21 1441 02/06/21 1513        Medication:   carvedilol  3.125 mg Oral BID WC   Chlorhexidine Gluconate Cloth  6 each Topical Daily   hydrocortisone cream   Topical QID   insulin aspart  0-9 Units Subcutaneous TID WC   insulin glargine-yfgn  40 Units Subcutaneous QHS   linaclotide  145 mcg  Oral QAC breakfast   metolazone  2.5 mg Oral Weekly   pantoprazole (PROTONIX) IV  40 mg Intravenous Q12H   sodium chloride flush  3 mL Intravenous Q12H   sodium chloride flush  3 mL Intravenous Q12H   sucralfate  1 g Oral QID   torsemide  20 mg Oral BID    sodium chloride, acetaminophen **OR** acetaminophen, bisacodyl, camphor-menthol, hydrALAZINE, HYDROmorphone (DILAUDID) injection, hydrOXYzine, ipratropium, levalbuterol, ondansetron **OR** ondansetron (ZOFRAN) IV, oxyCODONE, senna-docusate, sodium chloride flush, traZODone   Objective:   Vitals:   02/07/21 0645 02/07/21 0700 02/07/21 0730 02/07/21  1116  BP: (!) 107/39 (!) 108/39    Pulse: (!) 57 (!) 59  (!) 58  Resp: (!) 21 15  14   Temp: 97.8 F (36.6 C)  98 F (36.7 C) 98.9 F (37.2 C)  TempSrc: Oral  Oral Oral  SpO2: 100% 100%  99%  Weight:      Height:        Intake/Output Summary (Last 24 hours) at 02/07/2021 1512 Last data filed at 02/07/2021 1305 Gross per 24 hour  Intake 1460.6 ml  Output 5300 ml  Net -3839.4 ml   Filed Weights   02/06/21 1714 02/07/21 0500  Weight: 96.4 kg 93.9 kg     Examination:   Physical Exam  Constitution:  Alert, cooperative, no distress,  Appears calm and comfortable  Psychiatric: Normal and stable mood and affect, cognition intact,   HEENT: Normocephalic, PERRL, otherwise with in Normal limits  Chest:Chest symmetric Cardio vascular:  S1/S2, RRR, No murmure, No Rubs or Gallops  pulmonary: Clear to auscultation bilaterally, respirations unlabored, negative wheezes / crackles Abdomen: Soft, non-tender, non-distended, bowel sounds,no masses, no organomegaly Muscular skeletal: Limited exam - in bed, able to move all 4 extremities, Normal strength,  Neuro: CNII-XII intact. , normal motor and sensation, reflexes intact  Extremities: No pitting edema lower extremities, +2 pulses  Skin: Dry, warm to touch, negative for any Rashes, No open wounds Wounds: per nursing documentation    ------------------------------------------------------------------------------------------------------------------------------------------    LABs:  CBC Latest Ref Rng & Units 02/07/2021 02/06/2021 01/30/2021  WBC 4.0 - 10.5 K/uL 11.7(H) 8.1 6.1  Hemoglobin 13.0 - 17.0 g/dL 8.4(L) 6.4(LL) 8.0(L)  Hematocrit 39.0 - 52.0 % 25.1(L) 21.6(L) 26.2(L)  Platelets 150 - 400 K/uL 189 176 114(L)   CMP Latest Ref Rng & Units 02/07/2021 02/06/2021 01/30/2021  Glucose 70 - 99 mg/dL 167(H) 276(H) 165(H)  BUN 8 - 23 mg/dL 111(H) 98(H) 78(H)  Creatinine 0.61 - 1.24 mg/dL 1.99(H) 2.24(H) 1.93(H)  Sodium 135 - 145 mmol/L  133(L) 126(L) 135  Potassium 3.5 - 5.1 mmol/L 3.6 5.3(H) 4.1  Chloride 98 - 111 mmol/L 100 97(L) 104  CO2 22 - 32 mmol/L 23 21(L) 22  Calcium 8.9 - 10.3 mg/dL 8.8(L) 7.9(L) 8.3(L)  Total Protein 6.5 - 8.1 g/dL - 6.1(L) -  Total Bilirubin 0.3 - 1.2 mg/dL - 2.3(H) -  Alkaline Phos 38 - 126 U/L - 176(H) -  AST 15 - 41 U/L - 27 -  ALT 0 - 44 U/L - 16 -       Micro Results Recent Results (from the past 240 hour(s))  Resp Panel by RT-PCR (Flu A&B, Covid) Nasopharyngeal Swab     Status: None   Collection Time: 02/06/21  1:51 PM   Specimen: Nasopharyngeal Swab; Nasopharyngeal(NP) swabs in vial transport medium  Result Value Ref Range Status   SARS Coronavirus 2 by RT PCR NEGATIVE NEGATIVE Final    Comment: (NOTE)  SARS-CoV-2 target nucleic acids are NOT DETECTED.  The SARS-CoV-2 RNA is generally detectable in upper respiratory specimens during the acute phase of infection. The lowest concentration of SARS-CoV-2 viral copies this assay can detect is 138 copies/mL. A negative result does not preclude SARS-Cov-2 infection and should not be used as the sole basis for treatment or other patient management decisions. A negative result may occur with  improper specimen collection/handling, submission of specimen other than nasopharyngeal swab, presence of viral mutation(s) within the areas targeted by this assay, and inadequate number of viral copies(<138 copies/mL). A negative result must be combined with clinical observations, patient history, and epidemiological information. The expected result is Negative.  Fact Sheet for Patients:  EntrepreneurPulse.com.au  Fact Sheet for Healthcare Providers:  IncredibleEmployment.be  This test is no t yet approved or cleared by the [place] FDA and  has been authorized for detection and/or diagnosis of SARS-CoV-2 by FDA under an Emergency Use Authorization (EUA). This EUA will remain  in effect (meaning  this test can be used) for the duration of the COVID-19 declaration under Section 564(b)(1) of the Act, 21 U.S.C.section 360bbb-3(b)(1), unless the authorization is terminated  or revoked sooner.       Influenza A by PCR NEGATIVE NEGATIVE Final   Influenza B by PCR NEGATIVE NEGATIVE Final    Comment: (NOTE) The Xpert Xpress SARS-CoV-2/FLU/RSV plus assay is intended as an aid in the diagnosis of influenza from Nasopharyngeal swab specimens and should not be used as a sole basis for treatment. Nasal washings and aspirates are unacceptable for Xpert Xpress SARS-CoV-2/FLU/RSV testing.  Fact Sheet for Patients: EntrepreneurPulse.com.au  Fact Sheet for Healthcare Providers: IncredibleEmployment.be  This test is not yet approved or cleared by the [place] FDA and has been authorized for detection and/or diagnosis of SARS-CoV-2 by FDA under an Emergency Use Authorization (EUA). This EUA will remain in effect (meaning this test can be used) for the duration of the COVID-19 declaration under Section 564(b)(1) of the Act, 21 U.S.C. section 360bbb-3(b)(1), unless the authorization is terminated or revoked.  Performed at [facility], [address], [place], [place] [address]   MRSA Next Gen by PCR, Nasal     Status: None   Collection Time: 02/06/21  8:20 PM   Specimen: Nasal Mucosa; Nasal Swab  Result Value Ref Range Status   MRSA by PCR Next Gen NOT DETECTED NOT DETECTED Final    Comment: (NOTE) The GeneXpert MRSA Assay (FDA approved for NASAL specimens only), is one component of a comprehensive MRSA colonization surveillance program. It is not intended to diagnose MRSA infection nor to guide or monitor treatment for MRSA infections. Test performance is not FDA approved in patients less than 41 years old. Performed at [facility], [address], [place], [place] [address]     Radiology Reports CT ABDOMEN PELVIS WO CONTRAST  Result Date:  01/26/2021 CLINICAL DATA:  Acute abdominal pain in the LEFT lower quadrant with nausea and vomiting that began this morning in a 75 year old male EXAM: CT ABDOMEN AND PELVIS WITHOUT CONTRAST TECHNIQUE: Multidetector CT imaging of the abdomen and pelvis was performed following the standard protocol without IV contrast. COMPARISON:  April 30, 2020. FINDINGS: Lower chest: Signs of trans arterial aortic valve replacement seen on scout images. Pacer device in place, incompletely evaluated, lead in the RIGHT heart as before. Small RIGHT-sided pleural effusion is diminished from more remote imaging. Mild septal thickening the lung bases without dense consolidative process. Lung base assessment mildly limited  due to respiratory motion. Hepatobiliary: Nodular hepatic contours compatible with cirrhotic morphologic changes. Cholelithiasis, contracted gallbladder about gallstones. No gross lesion on noncontrast imaging. Pancreas: No stranding about the pancreas to the extent evaluated due to streak artifact from gastroduodenal artery coiling. Pancreatic atrophy as before. Spleen: Normal. Adrenals/Urinary Tract: Adrenal glands are normal. Smooth contour the bilateral kidneys. Chronic perinephric stranding is unchanged. Venous collateral pathways about the RIGHT urinary bladder similarly unchanged. No substantial perivesical stranding, nephrolithiasis, ureteral calculi or hydronephrosis. Stomach/Bowel: Sigmoid diverticular disease with question of mild stranding near the descending/sigmoid junction. No abscess or free air. Normal appendix. Stomach is distended without signs of adjacent stranding. Small bowel is nondilated. Ovoid low-density structure adjacent to the third portion of the duodenum measures 4.4 x 3.2 cm previously 5.1 x 3.6 cm and December of 2021 and not substantially changed compared to imaging from February of 2022. Vascular/Lymphatic: Aortic atherosclerosis. No sign of aneurysm. Smooth contour of the IVC.  There is no gastrohepatic or hepatoduodenal ligament lymphadenopathy. No retroperitoneal or mesenteric lymphadenopathy. No pelvic sidewall lymphadenopathy. Limited assessment of vascular structures without intravenous contrast on today's study. Reproductive: Unremarkable aside from adjacent collateral vessels near the urinary bladder and prostate gland, not well assessed given lack of contrast. Other: Moderate LEFT small RIGHT inguinal hernias containing fat. Musculoskeletal: No acute bone finding. No destructive bone process. Spinal degenerative changes. Degenerative changes are marked and worse at the L3-4 level. IMPRESSION: Suspect mild acute uncomplicated sigmoid diverticulitis at the descending/sigmoid junction. Pancolonic diverticulosis. Hepatic cirrhosis. Small RIGHT-sided pleural effusion is diminished from more remote imaging. 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 Cholelithiasis, contracted gallbladder about gallstones. Aortic Atherosclerosis (ICD10-I70.0). Electronically Signed   By: Zetta Bills M.D.   On: 01/26/2021 17:18   CUP PACEART REMOTE DEVICE CHECK  Result Date: 01/15/2021 Scheduled remote reviewed. Normal device function.  AF burden 100%. Ave V response 60's-70's bpm. No OAC due to bleed. Next remote 91 days. LH   SIGNED: [name], MD, FHM. [facility],  Pager (please use [email or web address] to page/text) Please use Epic Secure Chat for non-urgent communication (7AM-7PM) > 66 minutes critical care time was spent evaluating patient, stabilizing patient, reviewing all electronic medical record, discussing plan of care with consultants, patient and his wife  If 7PM-7AM, please contact night-coverage [email or web address], 02/07/2021, 3:12 PM

## 2021-02-07 NOTE — Plan of Care (Addendum)
  Problem: Acute Rehab PT Goals(only PT should resolve) Goal: Pt Will Go Supine/Side To Sit Outcome: Progressing Flowsheets (Taken 02/07/2021 1313) Pt will go Supine/Side to Sit: with min guard assist Goal: Patient Will Transfer Sit To/From Stand Outcome: Progressing Flowsheets (Taken 02/07/2021 1313) Patient will transfer sit to/from stand: with min guard assist Goal: Pt Will Transfer Bed To Chair/Chair To Bed Outcome: Progressing Flowsheets (Taken 02/07/2021 1313) Pt will Transfer Bed to Chair/Chair to Bed:  min guard assist  with supervision Goal: Pt Will Ambulate Outcome: Progressing Flowsheets (Taken 02/07/2021 1313) Pt will Ambulate:  100 feet  with min guard assist  with supervision  with rolling walker    Cassie Jones, SPT  During this treatment session, the therapist was present, participating in and directing the treatment.  1:59 PM, 02/07/21 Lonell Grandchild, MPT Physical Therapist with [facility] [phone] office [phone] mobile phone

## 2021-02-08 ENCOUNTER — Encounter (HOSPITAL_COMMUNITY): Payer: Self-pay | Admitting: Family Medicine

## 2021-02-08 DIAGNOSIS — I4892 Unspecified atrial flutter: Secondary | ICD-10-CM

## 2021-02-08 DIAGNOSIS — K5732 Diverticulitis of large intestine without perforation or abscess without bleeding: Secondary | ICD-10-CM

## 2021-02-08 DIAGNOSIS — I428 Other cardiomyopathies: Secondary | ICD-10-CM

## 2021-02-08 DIAGNOSIS — K920 Hematemesis: Secondary | ICD-10-CM

## 2021-02-08 DIAGNOSIS — I5043 Acute on chronic combined systolic (congestive) and diastolic (congestive) heart failure: Secondary | ICD-10-CM | POA: Diagnosis not present

## 2021-02-08 DIAGNOSIS — D649 Anemia, unspecified: Secondary | ICD-10-CM

## 2021-02-08 DIAGNOSIS — D500 Iron deficiency anemia secondary to blood loss (chronic): Secondary | ICD-10-CM

## 2021-02-08 DIAGNOSIS — K922 Gastrointestinal hemorrhage, unspecified: Secondary | ICD-10-CM

## 2021-02-08 DIAGNOSIS — K6389 Other specified diseases of intestine: Secondary | ICD-10-CM

## 2021-02-08 LAB — MAGNESIUM: Magnesium: 2.1 mg/dL (ref 1.7–2.4)

## 2021-02-08 LAB — HEMOGLOBIN AND HEMATOCRIT, BLOOD
HCT: 19.8 % — ABNORMAL LOW (ref 39.0–52.0)
HCT: 25.2 % — ABNORMAL LOW (ref 39.0–52.0)
Hemoglobin: 6.7 g/dL — CL (ref 13.0–17.0)
Hemoglobin: 8.4 g/dL — ABNORMAL LOW (ref 13.0–17.0)

## 2021-02-08 LAB — HEPATIC FUNCTION PANEL
ALT: 15 U/L (ref 0–44)
AST: 31 U/L (ref 15–41)
Albumin: 3 g/dL — ABNORMAL LOW (ref 3.5–5.0)
Alkaline Phosphatase: 121 U/L (ref 38–126)
Bilirubin, Direct: 0.8 mg/dL — ABNORMAL HIGH (ref 0.0–0.2)
Indirect Bilirubin: 1.3 mg/dL — ABNORMAL HIGH (ref 0.3–0.9)
Total Bilirubin: 2.1 mg/dL — ABNORMAL HIGH (ref 0.3–1.2)
Total Protein: 5.9 g/dL — ABNORMAL LOW (ref 6.5–8.1)

## 2021-02-08 LAB — CBC
HCT: 20.8 % — ABNORMAL LOW (ref 39.0–52.0)
Hemoglobin: 6.7 g/dL — CL (ref 13.0–17.0)
MCH: 27.2 pg (ref 26.0–34.0)
MCHC: 32.2 g/dL (ref 30.0–36.0)
MCV: 84.6 fL (ref 80.0–100.0)
Platelets: 139 10*3/uL — ABNORMAL LOW (ref 150–400)
RBC: 2.46 MIL/uL — ABNORMAL LOW (ref 4.22–5.81)
RDW: 21.4 % — ABNORMAL HIGH (ref 11.5–15.5)
WBC: 8.5 10*3/uL (ref 4.0–10.5)
nRBC: 0.2 % (ref 0.0–0.2)

## 2021-02-08 LAB — GLUCOSE, CAPILLARY
Glucose-Capillary: 82 mg/dL (ref 70–99)
Glucose-Capillary: 131 mg/dL — ABNORMAL HIGH (ref 70–99)
Glucose-Capillary: 190 mg/dL — ABNORMAL HIGH (ref 70–99)
Glucose-Capillary: 135 mg/dL — ABNORMAL HIGH (ref 70–99)

## 2021-02-08 LAB — BASIC METABOLIC PANEL
Anion gap: 8 (ref 5–15)
BUN: 108 mg/dL — ABNORMAL HIGH (ref 8–23)
CO2: 25 mmol/L (ref 22–32)
Calcium: 8.3 mg/dL — ABNORMAL LOW (ref 8.9–10.3)
Chloride: 100 mmol/L (ref 98–111)
Creatinine, Ser: 2.14 mg/dL — ABNORMAL HIGH (ref 0.61–1.24)
GFR, Estimated: 31 mL/min — ABNORMAL LOW (ref >60)
Glucose, Bld: 89 mg/dL (ref 70–99)
Potassium: 3.3 mmol/L — ABNORMAL LOW (ref 3.5–5.1)
Sodium: 133 mmol/L — ABNORMAL LOW (ref 135–145)

## 2021-02-08 LAB — PROTIME-INR
INR: 1.4 — ABNORMAL HIGH (ref 0.8–1.2)
Prothrombin Time: 17.4 seconds — ABNORMAL HIGH (ref 11.4–15.2)

## 2021-02-08 LAB — PREPARE RBC (CROSSMATCH)

## 2021-02-08 MED ORDER — SODIUM CHLORIDE 0.9% IV SOLUTION: Freq: Once | INTRAVENOUS | Status: DC

## 2021-02-08 MED ORDER — SODIUM CHLORIDE 0.9 % IV SOLN
50.0000 ug/h | INTRAVENOUS | Status: DC
  Administered 2021-02-08 – 2021-02-10 (×4): 50 ug/h via INTRAVENOUS
  Filled 2021-02-08 (×12): qty 1

## 2021-02-08 MED ORDER — SODIUM CHLORIDE 0.9% IV SOLUTION: Freq: Once | INTRAVENOUS | Status: AC

## 2021-02-08 MED ORDER — SODIUM CHLORIDE 0.9 % IV SOLN: INTRAVENOUS | Status: DC

## 2021-02-08 MED ORDER — POLYETHYLENE GLYCOL 3350 17 G PO PACK
17.0000 g | PACK | Freq: Once | ORAL | Status: AC
  Administered 2021-02-08: 17 g via ORAL
  Filled 2021-02-08: qty 1

## 2021-02-08 MED ORDER — NOREPINEPHRINE 4 MG/250ML-% IV SOLN: 0.0000 ug/min | INTRAVENOUS | Status: DC

## 2021-02-08 MED ORDER — NOREPINEPHRINE 4 MG/250ML-% IV SOLN
2.0000 ug/min | INTRAVENOUS | Status: DC
  Administered 2021-02-08: 2 ug/min via INTRAVENOUS
  Filled 2021-02-08: qty 250

## 2021-02-08 MED ORDER — BISACODYL 10 MG RE SUPP
10.0000 mg | Freq: Once | RECTAL | Status: AC
  Administered 2021-02-08: 10 mg via RECTAL
  Filled 2021-02-08: qty 1

## 2021-02-08 MED ORDER — POTASSIUM CHLORIDE CRYS ER 20 MEQ PO TBCR
40.0000 meq | EXTENDED_RELEASE_TABLET | Freq: Once | ORAL | Status: AC
  Administered 2021-02-08: 40 meq via ORAL
  Filled 2021-02-08: qty 2

## 2021-02-08 MED ORDER — SODIUM CHLORIDE 0.9 % IV SOLN
250.0000 mL | INTRAVENOUS | Status: DC
  Administered 2021-02-08: 250 mL via INTRAVENOUS

## 2021-02-08 NOTE — Progress Notes (Signed)
0744 Patient reported no BM (had a small smear yesterday) & doesn't recall if he has passed any gas, denied any pain or discomfort at this time, Hgb 6.7 & currently receiving blood, BLE edema has improved since yesterday +4 to +2 this AM, TED hose applied this AM, BPs still running soft with last BP 98/82

## 2021-02-08 NOTE — Progress Notes (Signed)
Sidebar: Blood Product Transfusion Guidelines (ADULT/PEDS/NICU) *This sidebar is in reference to the administration of non-emergent blood products (packed red blood cells, platelets, plasma, and cryoprecipitates). Detailed procedural instructions can be found in Clinical Skills. For massive transfusion, refer to the appropriate policy and procedure.   Prior to Picking up Blood Product From Blood Bank Order - There is an order in [facility] to transfuse. Provider Attestation/Consent order in [facility]. Consent Complete: Place completed consent form in the shadow chart to be scanned into the medical record. ADULT: Patient (or patient's representative) and witness signature, both dated and timed PEDS/NICU: Parent/Guardian and witness signature, both dated and timed Patient Education - Patient education reviewed on the signs and symptoms of a reaction and to notify nurse (Documented in flowsheet). Pre-Vitals - Vitals (temperature, pulse, blood pressure, respirations) taken within 30 minutes of transfusion start and documented. If temperature >100.4, consult with the provider before picking up the blood product. Administer premedications if ordered.     To Be Done at Bedside  Issue Time: Issue time is noted on the Blood Product Identification Tag by the blood bank. If not present, take back to the blood bank to be added. Independent Verification: The RN and another licensed nurse will independently verify the information on the blood bag, the Blood Product Identification Tag, and identify the correct patient at the bedside using two identifiers. Verify independently the patient's identity by checking medical record number and name. Verify independently the provider's order to transfuse the blood product. Verify independently the provider's completion of the Attestation of Consent. Independently match the unique blood bank band number with the unique blood band number on the attached Blood Product Identification  Tag (FFP, platelets, and cryoprecipitate do not require a blood bank band number). Verify independently the intended recipient's blood type and the donor blood type, interpretation of crossmatch tests (when applicable; examples include Compatible and Least Incompatible), and special transfusion requirements (when applicable; examples include Irradiated Product, CMV Negative, Washed Product, etc.). Verify independently the blood bag label against the remainder of the information on the Blood Product Identification Tag. Information to verify includes the patient's name, medical record number, donor unit expiration date and time, and type of blood product. Independently inspect the blood product bag for clots, excessive air, and abnormal color. Notify blood bank if any of these are present and immediately return the blood product to the blood bank. If all of the information corresponds, both nurses will sign the Blood Product Identification Tag. If there are any verification discrepancies, do not administer the blood product and return it to the blood bank immediately. Start Time: Blood product started within 30 minutes of issue time. Start Time is when the blood product enters the patient's vascular system. Start Rate: ADULT: 120 mL/hr x 15 minutes PEDS: <50gs: 32m/kg/hr x 15 minutes   -or-   >50kgs: 547mhr x 15 minutes NICU: Determine rate by dividing the total blood product volume by the ordered duration of transfusion; one rate maintained for the duration of the transfusion Assesses for Transfusion Reaction: Nurse remains in room with patient for the first 15 minutes. If patient has a transfusion reaction, stop the blood product, document on the flowsheet, and follow the Transfusion Reaction procedure.   15 Minute Vitals: Vitals (temperature, pulse, blood pressure, respirations) taken 15 minutes after start time and documented.  After First 15 Minutes: If vital signs are similar to the pre-transfusion  vitals and patient shows no signs or symptoms of a transfusion reaction, adjust the  rate to transfuse the blood product within 4 hours of issue time or as specified in the provider's order. Total transfusion time may not exceed 4 hours which begins at the time the blood product leaves the blood bank. Observe Patient Frequently: Observe the patient frequently throughout the remainder of the transfusion for any signs or symptoms of a transfusion reaction. Stop the transfusion immediately if signs/symptoms of reaction occur and follow the Transfusion Reaction procedure. PEDS/NICU Only: Vitals (temperature, pulse, blood pressure, respirations) taken every hour until transfusion completed and documented. End Vitals: Vitals (temperature, pulse, blood pressure, respirations) taken at completion of transfusion (within 30 minutes of stop time) and documented. End Time: Blood product completion within 4 hours of issue time. Document stopped time in [facility] and right click to "complete transfusion" (this will gray section out). Volume: Document volume in [facility]. Must match volume on the blood tag. If the whole unit is not given, must match volume in the order.   Blood Product Identification Tag   Start/End Times - Start/end date and time documented on Blood Product Identification Tag. Attached - Blood Product Identification Tag remains attached to the blood product during transfusion. File - Blood Product Identification Tag placed in the shadow chart upon completion to be scanned into the medical record

## 2021-02-08 NOTE — Progress Notes (Signed)
[facility]                                                                              Patient Demographics  John [name], is a 75 y.o. male, DOB - [birth year], CNO:[id number]  Admit date - 02/06/2021   Admitting Physician [name], MD  Outpatient Primary MD for the patient is [name], MD  Outpatient specialists:   LOS - 2  days   Medical records reviewed and are as summarized below:    No chief complaint on file.      Brief summary   Patient is a 75 year old male with history of systolic and diastolic CHF, DM 2, hypertension, Nash cirrhosis, CKD stage IIIb, heart block status post PPM, HLD, persistent atrial fibrillation off chronic anticoagulation due to GI bleed presented with melanotic black stools for episodes on the morning of admission, lower extremity edema, shortness of breath with orthopnea. Patient was noted to have hemoglobin of 6.4, Hemoccult positive.  Patient was admitted for further work-up. Recently hospitalized 11/3-11/6 for GI bleed and was diagnosed with diverticulitis and duodenal ulcer  Assessment & Plan    Principal Problem:   Acute upper GI bleeding -Presented with hemoglobin of 6.4, was transfused 2 unit packed RBCs, improved to 8.4  -Recent EGD showed healing duodenal ulcer, diverticulitis on last admission.  Status post embolization in 09/2020, surveillance EGD 01/27/2021 with a healed ulcer and portal gastropathy.  History of diverticulitis while inpatient 01/26/2021 -GI consulted, underwent capsule endoscopy on 11/15, will need colonoscopy ultimately but had recent episode of diverticulitis. -  Capsule endoscopy results per GI incomplete study as Stool did not reach cecum, old blood, coffee- ground material in the stomach. Few small clots in small bowel -Plan for EGD in a.m.  Active Problems: Nonischemic cardiomyopathy, chronic systolic CHF -Overnight hypotensive, was briefly placed on Levophed.  This morning BP  stable, creatinine trending up. -Consulted cardiology, appreciate recommendations, hold torsemide and metolazone, monitor closely  Diabetes mellitus type 2, uncontrolled with hyperglycemia, CKD -Hold Trulicity, Tyler Aas, continue sliding scale insulin Recent Labs    02/07/21 0722 02/07/21 0823 02/07/21 1829 02/07/21 2104 02/08/21 0742 02/08/21 1138  GLUCAP 193* 187* 191* 220* 82 131*   -Hemoglobin A1c 9.2 on 12/31/2020  Chronic atrial fibrillation, history of TAVR 04/2020 -Rate currently controlled, has pacemaker -Not on anticoagulation due to #1  AKI on CKD (chronic kidney disease) stage 3, GFR 30-59 ml/min (HCC) -Baseline creatinine 1.8-2.0 -Follow closely monitor renal function    Hyponatremia -Likely due to hypovolemia      Severe aortic stenosis, S/P TAVR (transcatheter aortic valve replacement) in 04/2020 -No acute issues  Diverticulitis -Last admission on 11/4, was treated with Rocephin and Flagyl, then switched to oral antibiotics, was completed 11/11   Code Status: Full code DVT Prophylaxis:  Place and maintain sequential compression device Start: 02/06/21 1517 TED hose Start: 02/06/21 1435 SCDs Start: 02/06/21 1435   Level of Care: Level of care: Stepdown Family Communication: Discussed all imaging results, lab results, explained to the patient and wife at the bedside   Disposition Plan:     Status is: Inpatient  Remains  inpatient appropriate because: Capsule endoscopy results inconclusive, GI recommending EGD  Time Spent in minutes   35 minutes   Procedures:  Capsule endoscopy  Consultants:   Cardiology  Antimicrobials:   Anti-infectives (From admission, onward)    Start     Dose/Rate Route Frequency Ordered Stop   02/06/21 1615  cefTRIAXone (ROCEPHIN) 2 g in sodium chloride 0.9 % 100 mL IVPB        2 g 200 mL/hr over 30 Minutes Intravenous Every 24 hours 02/06/21 1614     02/06/21 1445  cefdinir (OMNICEF) capsule 300 mg  Status:  Discontinued         300 mg Oral Every 12 hours 02/06/21 1441 02/06/21 1513   02/06/21 1445  metroNIDAZOLE (FLAGYL) tablet 500 mg  Status:  Discontinued        500 mg Oral Every 12 hours 02/06/21 1441 02/06/21 1513          Medications  Scheduled Meds:  sodium chloride   Intravenous Once   carvedilol  3.125 mg Oral BID WC   Chlorhexidine Gluconate Cloth  6 each Topical Daily   hydrocortisone cream  1 application Topical QID   insulin aspart  0-9 Units Subcutaneous TID WC   insulin glargine-yfgn  40 Units Subcutaneous QHS   linaclotide  145 mcg Oral QAC breakfast   pantoprazole (PROTONIX) IV  40 mg Intravenous Q12H   sodium chloride flush  3 mL Intravenous Q12H   sodium chloride flush  3 mL Intravenous Q12H   sucralfate  1 g Oral QID   Continuous Infusions:  sodium chloride     sodium chloride Stopped (02/08/21 0150)   cefTRIAXone (ROCEPHIN)  IV 2 g (02/08/21 1508)   norepinephrine (LEVOPHED) Adult infusion Stopped (02/08/21 0150)   PRN Meds:.sodium chloride, acetaminophen **OR** acetaminophen, bisacodyl, camphor-menthol, hydrALAZINE, HYDROmorphone (DILAUDID) injection, hydrOXYzine, ipratropium, levalbuterol, ondansetron **OR** ondansetron (ZOFRAN) IV, oxyCODONE, senna-docusate, sodium chloride flush, traZODone      Subjective:   Darek Eifler was seen and examined today.  Feeling weak, overnight was hypotensive, briefly requiring Levophed.  No ongoing GI bleeding.  No abdominal pain nausea or vomiting.  Objective:   Vitals:   02/08/21 1300 02/08/21 1400 02/08/21 1430 02/08/21 1500  BP: (!) 134/99 (!) 130/56 (!) 112/41 95/62  Pulse: (!) 53 (!) 44 (!) 37 (!) 43  Resp: 18 19 17 17   Temp:      TempSrc:      SpO2: 98% 99% 100% 99%  Weight:      Height:        Intake/Output Summary (Last 24 hours) at 02/08/2021 1524 Last data filed at 02/08/2021 0948 Gross per 24 hour  Intake 891.89 ml  Output 1500 ml  Net -608.11 ml     Wt Readings from Last 3 Encounters:  02/08/21 93.7  kg  02/01/21 99.5 kg  01/31/21 100.4 kg     Exam General: Alert and oriented x 3, NAD, ill-appearing Cardiovascular: S1 S2 auscultated, RRR Respiratory: Clear to auscultation bilaterally, no wheezing, rales or rhonchi Gastrointestinal: Soft, nontender, nondistended, + bowel sounds Ext: no pedal edema bilaterally Neuro: no new deficits Psych: Normal affect and demeanor, alert and oriented x3    Data Reviewed:  I have personally reviewed following labs and imaging studies  Micro Results Recent Results (from the past 240 hour(s))  Resp Panel by RT-PCR (Flu A&B, Covid) Nasopharyngeal Swab     Status: None   Collection Time: 02/06/21  1:51 PM   Specimen: Nasopharyngeal Swab; Nasopharyngeal(NP)  swabs in vial transport medium  Result Value Ref Range Status   SARS Coronavirus 2 by RT PCR NEGATIVE NEGATIVE Final    Comment: (NOTE) SARS-CoV-2 target nucleic acids are NOT DETECTED.  The SARS-CoV-2 RNA is generally detectable in upper respiratory specimens during the acute phase of infection. The lowest concentration of SARS-CoV-2 viral copies this assay can detect is 138 copies/mL. A negative result does not preclude SARS-Cov-2 infection and should not be used as the sole basis for treatment or other patient management decisions. A negative result may occur with  improper specimen collection/handling, submission of specimen other than nasopharyngeal swab, presence of viral mutation(s) within the areas targeted by this assay, and inadequate number of viral copies(<138 copies/mL). A negative result must be combined with clinical observations, patient history, and epidemiological information. The expected result is Negative.  Fact Sheet for Patients:  EntrepreneurPulse.com.au  Fact Sheet for Healthcare Providers:  IncredibleEmployment.be  This test is no t yet approved or cleared by the [place] FDA and  has been authorized for detection  and/or diagnosis of SARS-CoV-2 by FDA under an Emergency Use Authorization (EUA). This EUA will remain  in effect (meaning this test can be used) for the duration of the COVID-19 declaration under Section 564(b)(1) of the Act, 21 U.S.C.section 360bbb-3(b)(1), unless the authorization is terminated  or revoked sooner.       Influenza A by PCR NEGATIVE NEGATIVE Final   Influenza B by PCR NEGATIVE NEGATIVE Final    Comment: (NOTE) The Xpert Xpress SARS-CoV-2/FLU/RSV plus assay is intended as an aid in the diagnosis of influenza from Nasopharyngeal swab specimens and should not be used as a sole basis for treatment. Nasal washings and aspirates are unacceptable for Xpert Xpress SARS-CoV-2/FLU/RSV testing.  Fact Sheet for Patients: EntrepreneurPulse.com.au  Fact Sheet for Healthcare Providers: IncredibleEmployment.be  This test is not yet approved or cleared by the [place] FDA and has been authorized for detection and/or diagnosis of SARS-CoV-2 by FDA under an Emergency Use Authorization (EUA). This EUA will remain in effect (meaning this test can be used) for the duration of the COVID-19 declaration under Section 564(b)(1) of the Act, 21 U.S.C. section 360bbb-3(b)(1), unless the authorization is terminated or revoked.  Performed at [facility], [address] [place], [place] [address]   MRSA Next Gen by PCR, Nasal     Status: None   Collection Time: 02/06/21  8:20 PM   Specimen: Nasal Mucosa; Nasal Swab  Result Value Ref Range Status   MRSA by PCR Next Gen NOT DETECTED NOT DETECTED Final    Comment: (NOTE) The GeneXpert MRSA Assay (FDA approved for NASAL specimens only), is one component of a comprehensive MRSA colonization surveillance program. It is not intended to diagnose MRSA infection nor to guide or monitor treatment for MRSA infections. Test performance is not FDA approved in patients less than 41 years old. Performed at  [facility], [address] [place], [place] [address]     Radiology Reports CT ABDOMEN PELVIS WO CONTRAST  Result Date: 01/26/2021 CLINICAL DATA:  Acute abdominal pain in the LEFT lower quadrant with nausea and vomiting that began this morning in a 75 year old male EXAM: CT ABDOMEN AND PELVIS WITHOUT CONTRAST TECHNIQUE: Multidetector CT imaging of the abdomen and pelvis was performed following the standard protocol without IV contrast. COMPARISON:  April 30, 2020. FINDINGS: Lower chest: Signs of trans arterial aortic valve replacement seen on scout images. Pacer device in place, incompletely evaluated, lead in the RIGHT heart  as before. Small RIGHT-sided pleural effusion is diminished from more remote imaging. Mild septal thickening the lung bases without dense consolidative process. Lung base assessment mildly limited due to respiratory motion. Hepatobiliary: Nodular hepatic contours compatible with cirrhotic morphologic changes. Cholelithiasis, contracted gallbladder about gallstones. No gross lesion on noncontrast imaging. Pancreas: No stranding about the pancreas to the extent evaluated due to streak artifact from gastroduodenal artery coiling. Pancreatic atrophy as before. Spleen: Normal. Adrenals/Urinary Tract: Adrenal glands are normal. Smooth contour the bilateral kidneys. Chronic perinephric stranding is unchanged. Venous collateral pathways about the RIGHT urinary bladder similarly unchanged. No substantial perivesical stranding, nephrolithiasis, ureteral calculi or hydronephrosis. Stomach/Bowel: Sigmoid diverticular disease with question of mild stranding near the descending/sigmoid junction. No abscess or free air. Normal appendix. Stomach is distended without signs of adjacent stranding. Small bowel is nondilated. Ovoid low-density structure adjacent to the third portion of the duodenum measures 4.4 x 3.2 cm previously 5.1 x 3.6 cm and December of 2021 and not substantially changed compared  to imaging from February of 2022. Vascular/Lymphatic: Aortic atherosclerosis. No sign of aneurysm. Smooth contour of the IVC. There is no gastrohepatic or hepatoduodenal ligament lymphadenopathy. No retroperitoneal or mesenteric lymphadenopathy. No pelvic sidewall lymphadenopathy. Limited assessment of vascular structures without intravenous contrast on today's study. Reproductive: Unremarkable aside from adjacent collateral vessels near the urinary bladder and prostate gland, not well assessed given lack of contrast. Other: Moderate LEFT small RIGHT inguinal hernias containing fat. Musculoskeletal: No acute bone finding. No destructive bone process. Spinal degenerative changes. Degenerative changes are marked and worse at the L3-4 level. IMPRESSION: Suspect mild acute uncomplicated sigmoid diverticulitis at the descending/sigmoid junction. Pancolonic diverticulosis. Hepatic cirrhosis. Small RIGHT-sided pleural effusion is diminished from more remote imaging. 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 Cholelithiasis, contracted gallbladder about gallstones. Aortic Atherosclerosis (ICD10-I70.0). Electronically Signed   By: Zetta Bills M.D.   On: 01/26/2021 17:18   CUP PACEART REMOTE DEVICE CHECK  Result Date: 01/15/2021 Scheduled remote reviewed. Normal device function.  AF burden 100%. Ave V response 60's-70's bpm. No OAC due to bleed. Next remote 91 days. LH   Lab Data:  CBC: Recent Labs  Lab 02/06/21 1300 02/07/21 0957 02/08/21 0014 02/08/21 0430 02/08/21 1407  WBC 8.1 11.7*  --  8.5  --   NEUTROABS 5.7  --   --   --   --   HGB 6.4* 8.4* 6.7* 6.7* 8.4*  HCT 21.6* 25.1* 19.8* 20.8* 25.2*  MCV 87.1 82.6  --  84.6  --   PLT 176 189  --  139*  --    Basic Metabolic Panel: Recent Labs  Lab 02/06/21 1300 02/07/21 0957 02/08/21 0430 02/08/21 1407  NA 126* 133* 133*  --    K 5.3* 3.6 3.3*  --   CL 97* 100 100  --   CO2 21* 23 25  --   GLUCOSE 276* 167* 89  --   BUN 98* 111* 108*  --   CREATININE 2.24* 1.99* 2.14*  --   CALCIUM 7.9* 8.8* 8.3*  --   MG  --   --   --  2.1   GFR: Estimated Creatinine Clearance: 35.4 mL/min (A) (by C-G formula based on SCr of 2.14 mg/dL (H)). Liver Function Tests: Recent Labs  Lab 02/06/21 1300 02/08/21 1407  AST 27 31  ALT 16 15  ALKPHOS 176* 121  BILITOT 2.3* 2.1*  PROT 6.1* 5.9*  ALBUMIN 2.6* 3.0*   No results for input(s): LIPASE, AMYLASE in the last 168 hours. No results for input(s): AMMONIA in the last 168 hours. Coagulation Profile: Recent Labs  Lab 02/06/21 1300 02/07/21 0957 02/08/21 1407  INR 1.7* 1.5* 1.4*   Cardiac Enzymes: No results for input(s): CKTOTAL, CKMB, CKMBINDEX, TROPONINI in the last 168 hours. BNP (last 3 results) No results for input(s): PROBNP in the last 8760 hours. HbA1C: No results for input(s): HGBA1C in the last 72 hours. CBG: Recent Labs  Lab 02/07/21 0823 02/07/21 1829 02/07/21 2104 02/08/21 0742 02/08/21 1138  GLUCAP 187* 191* 220* 82 131*   Lipid Profile: No results for input(s): CHOL, HDL, LDLCALC, TRIG, CHOLHDL, LDLDIRECT in the last 72 hours. Thyroid Function Tests: No results for input(s): TSH, T4TOTAL, FREET4, T3FREE, THYROIDAB in the last 72 hours. Anemia Panel: No results for input(s): VITAMINB12, FOLATE, FERRITIN, TIBC, IRON, RETICCTPCT in the last 72 hours. Urine analysis:    Component Value Date/Time   COLORURINE STRAW (A) 01/26/2021 1328   APPEARANCEUR CLEAR 01/26/2021 1328   LABSPEC 1.008 01/26/2021 1328   PHURINE 6.0 01/26/2021 1328   GLUCOSEU NEGATIVE 01/26/2021 1328   HGBUR NEGATIVE 01/26/2021 1328   BILIRUBINUR NEGATIVE 01/26/2021 1328   KETONESUR NEGATIVE 01/26/2021 1328   PROTEINUR NEGATIVE 01/26/2021 1328   UROBILINOGEN 0.2 01/05/2014 0744   NITRITE NEGATIVE 01/26/2021 1328   LEUKOCYTESUR NEGATIVE 01/26/2021 1328     Allyah Heather M.D. [facility] 02/08/2021, 3:24 PM  Available via Epic secure chat 7am-7pm After 7 pm, please refer to night coverage provider listed on amion.

## 2021-02-08 NOTE — Progress Notes (Signed)
UNMATCHED BLOOD PRODUCT NOTE  Compare the patient ID on the blood tag to the patient ID on the hospital armband and Blood Bank armband. Then confirm the unit number on the blood tag matches the unit number on the blood product.  If a discrepancy is discovered return the product to blood bank immediately.   Blood Product Type: Packed Red Blood Cells  Unit #: (Found on blood product bag, begins with W) [id number]  Product Code #: (Found on blood product bag, begins with E) [id number]   Start Time: 0935  Starting Rate: 120 ml/hr  Rate increase/decreased  (if applicable):      ml/hr  Rate changed time (if applicable):   VERIFIED WITH 2ND RN - Lillie Fragmin, RN   All Other Documentation should be documented within the Blood Admin Flowsheet per policy.

## 2021-02-08 NOTE — Progress Notes (Signed)
Pt had a large, black, tarry stool this morning. No signs of the Given's Capsule being passed. GI attending made aware, will continue to monitor.

## 2021-02-08 NOTE — Progress Notes (Signed)
UNMATCHED BLOOD PRODUCT NOTE  Compare the patient ID on the blood tag to the patient ID on the hospital armband and Blood Bank armband. Then confirm the unit number on the blood tag matches the unit number on the blood product.  If a discrepancy is discovered return the product to blood bank immediately.   Blood Product Type: Packed Red Blood Cells  Unit #: (Found on blood product bag, begins with W) [id number]  Product Code #: (Found on blood product bag, begins with E) [id number]   Start Time: 0935  Starting Rate: 120 ml/hr  Rate increase/decreased  (if applicable): N/A  Rate changed time (if applicable): N/A   Stop Time: 1221  No noted reactions   All Other Documentation should be documented within the Blood Admin Flowsheet per policy.

## 2021-02-08 NOTE — Progress Notes (Signed)
OT Cancellation Note  Patient Details Name: John [name] MRN: [id number] DOB: 06/04/1945   Cancelled Treatment:    Reason Eval/Treat Not Completed: Medical issues which prohibited therapy. Nursing reporting that pt is not medically ready for therapy this date.   Kenzly Rogoff OT, MOT   Larey Seat 02/08/2021, 9:33 AM

## 2021-02-08 NOTE — Progress Notes (Signed)
Pt continues to have low blood pressures despite a bolus given in the beginning of the shift. UO has decreased since the night prior. Dr. Earnest Conroy has been notified. Note the MAR.  Pt is A+Ox4, does not have any c/o of chest pain, lungs sound clear and slightly diminished at bases.

## 2021-02-08 NOTE — Progress Notes (Signed)
Informed consent signed and placed in chart for EGD on 11/17.

## 2021-02-08 NOTE — Progress Notes (Signed)
Subjective: Patient reports he is feeling better today than when he first got to the hospital. He had a BM this morning that was melanotic, reports stool was formed. No BRBPR. He denies abdominal pain, nausea or vomiting.  He reports that pruritus has improved since Urso was started yesterday.  He denies dizziness or shortness of breath. He is a/ox4. Patient's RN reports he passed some gas this morning prior to his BM.  Objective: Vital signs in last 24 hours: Temp:  [98 F (36.7 C)-98.9 F (37.2 C)] 98.1 F (36.7 C) (11/16 0950) Pulse Rate:  [39-74] 50 (11/16 0830) Resp:  [13-25] 21 (11/16 0830) BP: (75-206)/(29-153) 98/42 (11/16 0950) SpO2:  [91 %-100 %] 100 % (11/16 0830) Weight:  [93.7 kg] 93.7 kg (11/16 0500) Last BM Date: 02/07/21 (SMALL SMEAR) General:   Alert and oriented, pleasant Head:  Normocephalic and atraumatic. Eyes:  No icterus, sclera clear. Conjuctiva pink.  Mouth:  Without lesions, mucosa pink and moist.  Heart:  S1, S2 present, no murmurs noted.  Lungs: Clear to auscultation bilaterally, without wheezing, rales, or rhonchi.  Abdomen:  Bowel sounds present, soft, non-tender, non-distended. No HSM or hernias noted. No rebound or guarding. No masses appreciated  Msk:  Symmetrical without gross deformities. Normal posture. Pulses:  Normal pulses noted. Extremities:  Without clubbing, mild non pitting edema Neurologic:  Alert and  oriented x4;  grossly normal neurologically. Skin:  Warm and dry, intact without significant lesions.  Psych:  Alert and cooperative. Normal mood and affect.  Intake/Output from previous day: 11/15 0701 - 11/16 0700 In: 1510.9 [I.V.:164.5; Blood:625; IV Piggyback:721.4] Out: 1950 [Urine:1950] Intake/Output this shift: Total I/O In: 131 [I.V.:131] Out: -   Lab Results: Recent Labs    02/06/21 1300 02/07/21 0957 02/08/21 0014 02/08/21 0430  WBC 8.1 11.7*  --  8.5  HGB 6.4* 8.4* 6.7* 6.7*  HCT 21.6* 25.1* 19.8* 20.8*  PLT 176  189  --  139*   BMET Recent Labs    02/06/21 1300 02/07/21 0957 02/08/21 0430  NA 126* 133* 133*  K 5.3* 3.6 3.3*  CL 97* 100 100  CO2 21* 23 25  GLUCOSE 276* 167* 89  BUN 98* 111* 108*  CREATININE 2.24* 1.99* 2.14*  CALCIUM 7.9* 8.8* 8.3*   LFT Recent Labs    02/06/21 1300  PROT 6.1*  ALBUMIN 2.6*  AST 27  ALT 16  ALKPHOS 176*  BILITOT 2.3*   PT/INR Recent Labs    02/06/21 1300 02/07/21 0957  LABPROT 20.2* 18.3*  INR 1.7* 1.5*    Assessment: 75 year old male with hx of NASH cirrhosis, duodenal ulcer s/p embolization in July 2022 and surveillance EGD 01/27/21 with healed ulcer and portal gastropathy, hx of diverticulitis 01/26/21 while inpatient, IDA, who presented to ED on 02/06/21 with recurring symptomatic anemia and melena.  Acute blood loss anemia w/melena: FOBT positive on admission, hgb 6.4 initially, down from 8 last week. Received 2 units PRBCs with improvement of hgb to 8.4 yesterday, however, hgb back down to 6.7 today, third unit PRBCs completed this morning with fourth unit in process. Has had one formed, melanotic stool this morning and passed some flatus. He is without sob, dizziness or fatigue. Givens capsule was deployed yesterday and will be read today. He was started on rocephin for SBP prophylaxis r/t hx of cirrhosis and current GI bleeding. Octreotide not indicated given lack of esophageal varices on most recent EGD 01/27/21. Planning for colonoscopy in the next few weeks given  recent diverticulitis.   Patient became hypotensive overnight with BP dropping into the 73U systolic, and was started on levophed with improvement, levophed was stopped after approx 1 hour, BP remains soft but stable (121/34).   NASH Cirrhosis/pruritus: Alk phos 176 on admission with normal transaminases, T bili 2.3, INR 1.5(1.7). Plt count 176k. he was supposed to be started on Urso 642m BID yesterday, for management of possible cholestasis leading to reported pruritus that  patient has been experiencing, however, it appears Urso was discontinued and patient was started on vistaril PRN. Pruritus is improved per patient's report, he did not notice itching to torso throughout the night. Will update LFTs and INR today. Not on diuretics or lactulose currently. He is a/ox4.   MELD:24  as of yesterday  Plan: Remain NPO Givens capsule reading today, further recommendations to follow Continue IV PPI BID Continue IV abx for SBP prophylaxis Monitor H&H, transfuse for hgb <7 CBC, CMP, INR daily (will update INR and HFP today)   LOS: 2 days    02/08/2021, 10:25 AM  Chelsea L. CAlver Sorrow MSN, APRN, AGNP-C Adult-Gerontology Nurse Practitioner [facility]

## 2021-02-08 NOTE — Op Note (Signed)
Small Bowel Givens Capsule Study Procedure date: 02/07/2021  Referring Provider: Estill Cotta, MD PCP:  Dr. Nevada Crane, Edwinna Areola, MD  Indication for procedure:   Patient is 75 year old male with cirrhosis secondary to NASH, history of duodenal ulcer status postembolization in July 2022 and EGD on 01/27/2021 revealed healed ulcer who presents with recurrent GI bleed and hemoglobin of 6.4 g requiring blood transfusion.    Findings:    Patient was able to swallow given capsule without any difficulty. Study is incomplete as given capsule did not reach cecum. Multiple gastric images show blind and clots and coffee-ground material.  Quality of images is somewhat obscured by presence of mucus.  Part of the mucosa that are seen show gastritis and/or portal gastropathy.  No bleeding lesion identified. Small bowel evaluation suboptimal because of presence of food debris and there were few small clots such as on image at 04:14:12 and 06:28:08  First Gastric image: 59 seconds First Duodenal image: 45 minutes and 38 seconds First Ileo-Cecal Valve image: Capsule did not reach cecum First Cecal image: Capsule did not reach cecum Gastric Passage time: 44 minutes and 37 seconds Small Bowel Passage time: Cannot be calculated as given capsule did not reach cecum.  Summary & Recommendations:  Incomplete study as given capsule did not reach cecum. There is old blood and coffee-ground material in the stomach. Few small free-floating clots noted in small bowel. Would recommend esophagogastroduodenoscopy in a.m.

## 2021-02-08 NOTE — Progress Notes (Signed)
Small bowel given capsule study findings reviewed with patient and his wife Lelan Pons. Patient had 2 large melanic stools today.  No history of nausea vomiting hematemesis or abdominal pain. He is alert and does not have asterixis. Abdominal examination is within normal limits. He does not have peripheral edema.   Patient begun on octreotide infusion.  He is already on IV ceftriaxone. He had full liquids at lunchtime. Therefore will plan esophagogastroduodenoscopy with therapeutic intention tomorrow by Dr. Abbey Chatters. Please note patient had esophagogastroduodenoscopy 12 days ago and he did not have esophageal or gastric varices.  He does have portal gastropathy. Therefore will order Doppler study to make sure portal vein is patent.

## 2021-02-08 NOTE — Progress Notes (Addendum)
iUNMATCHED BLOOD PRODUCT NOTE  Compare the patient ID on the blood tag to the patient ID on the hospital armband and Blood Bank armband. Then confirm the unit number on the blood tag matches the unit number on the blood product.  If a discrepancy is discovered return the product to blood bank immediately.   Blood Product Type: Packed Red Blood Cells  Unit #: (Found on blood product bag, begins with W) [id number]  Product Code #: (Found on blood product bag, begins with E) [id number]   Start Time: 0528  Starting Rate: 120 ml/hr  Rate increase/decreased  (if applicable):      ml/hr  Rate changed time (if applicable):    Stop Time: Infusion will still be running at the end of TRN's shift.   Verified with 2nd RN - Myna Hidalgo, RN  All Other Documentation should be documented within the Blood Admin Flowsheet per policy.

## 2021-02-08 NOTE — Consult Note (Addendum)
Cardiology Consultation:   Patient ID: John [name] MRN: [id number]; DOB: 1945/12/27  Admit date: 02/06/2021 Date of Consult: 02/08/2021  PCP:  [name], MD   [place] Providers Cardiologist:  [name], MD  Electrophysiologist:  [name], MD  Advanced Heart Failure:  Glori Bickers, MD       Patient Profile:   John [name] is a 75 y.o. male with a hx of systolic HF-NICM CHB with PPM, HTN, DM-2, atrial flutter, CKD 3b severe AS with TAVR 2/22, NASH cirrhosis who is being seen 02/08/2021 for the evaluation of CHF with admit for GI Bleed at the request of Dr. Tana Coast.  History of Present Illness:   John [name] followed by Dr. Haroldine Laws in advanced HF clinic last seen 86/7/67 with systolic HF due to NICM, cath 11/2019 with no CAD.  Echo 01/01/21 EF 30-35%.  Right ventricular systolic function is low normal. The right ventricular size is moderately enlarged. There is mildly elevated pulmonary artery systolic pressure. Rt and Lt atrium severely dilated.  Mild to mod MR, moderate TR, no AR, no aortic stenosis, + 29 mm edwards sapien valve TAVR in 2022.  Aortic valve mean gradient measures 10.0   mmHg. Aortic valve Vmax measures 2.13 m/s.   (No farxiga due to yeast)  He has PPM for CHB placed in 2017 last remote eval 01/13/21 with leads and battery stable for pt.  Permanent a fib. Hx of AFL ablation.   Average HR 60-70s, no OAC due to bleed.-thought had been to resume if EGD stable.  06/08/20 Attempt for CRT upgrade unsuccessful with occluded Lt subclavian vein.   His CKD is followed by nephrology. Baseline SCr 1.8 (1.5 to 2.0)  Admit for CHF 10/8-10/10/22 diuresed with IV lasix and metolazone.  Discharge wt of 209 lbs.   Admitted 11/3-11/6/22 for acute diverticulitis EGD 11/4 showing nonobstructive schatzki ring; portal HTN gastropathy; & healed ulcer in duodenal bulb without stigmata of bleeding. Received 4 U PRBCs during hospitalization. He remained DNR.  Wt on last  appt with CHF 210 lbs.  His coreg was decreased to allow for diuresis.    Pt presented 02/06/21 to ER for dark stools 4 episodes day of admit.  Also with SOB flat and lower ext edema.  His stools with heme+ started on IV protonix and GI consult.  Hgb 6.4 transfused.   Rec'd 2 units on the 14th and has 2 units ordered today.  Labs today Na 133, K+ 3.3 BUN 108 Cr 2.15 was 2.24 on admit up from 1.93 on the 7th of Nov. Hgb 6.7 WBC 8.5 and plts 139 (down from 189) INR 1.5  Hs troponin 74 and 69  BNP on admit 1,516  Neg COVID  Capsule endoscopy started 02/07/21  EKG:  The EKG was personally reviewed and demonstrates:  a fib with V. pacing at rate of 60 no acute ST changes Telemetry:  Telemetry was personally reviewed and demonstrates:  atrial fib with V. Pacing  BP 98/42 P 50-84 though documentation is from 55 to 84. Afebrile. Wt today 206.14  neg 4,447 ml.   Home torsemide 60 BID here 20 mg BID and metolazone 2.5 weekly.   Pt with therapy yesterday had some chest discomfort but resolved at rest.  No SOB.    On IV levophed after IV fluid bolus yesterday and BP down to 76/31.  Now improved.     Past Medical History:  Diagnosis Date   Atrial flutter ([place]) 12/2010   Admitted  with symptomatic bradycardia (HR 40s), atrial flutter with slow ventricular response 12/2010 + volume overload; AV nodal agents d/c'd and Pradaxa started; RFA in 01/2011   Chronic kidney disease, stage 3b (HCC)    Chronic systolic CHF (congestive heart failure) (HCC)    Class 2 severe obesity due to excess calories with serious comorbidity and body mass index (BMI) of 37.0 to 37.9 in adult ([place]) 07/17/2017   Diabetes mellitus    non insulin dependant   Hematoma, chest wall 05/15/2020   Hyperlipidemia    Hypertension    Mitral regurgitation    NASH (nonalcoholic steatohepatitis)    NICM (nonischemic cardiomyopathy) (HCC)    Osteoarthritis    Polyarticular gout 05/15/2020   Presence of permanent cardiac pacemaker     PSVT (paroxysmal supraventricular tachycardia) (HCC)    Possibly atrial flutter   S/P TAVR (transcatheter aortic valve replacement) 05/03/2020   s/p TAVR with a 29 mm Edwards S3U via the subclavian approach with Dr. Angelena Form & Dr. Cyndia Bent    Severe aortic stenosis     Past Surgical History:  Procedure Laterality Date   ATRIAL FLUTTER ABLATION N/A 02/07/2011   Procedure: ATRIAL FLUTTER ABLATION;  Surgeon: Evans Lance, MD;  Location: [facility] CATH LAB;  Service: Cardiovascular;  Laterality: N/A;   BIV UPGRADE N/A 06/08/2020   Procedure: BIV PPM UPGRADE;  Surgeon: Evans Lance, MD;  Location: [place] CV LAB;  Service: Cardiovascular;  Laterality: N/A;   CARDIAC ELECTROPHYSIOLOGY STUDY AND ABLATION  02/07/2011   CARDIOVERSION N/A 03/23/2016   Procedure: CARDIOVERSION;  Surgeon: Evans Lance, MD;  Location: [place];  Service: Cardiovascular;  Laterality: N/A;   COLONOSCOPY N/A 04/17/2017   Rehman: 3 polyps in [facility], diverticulosis   EP IMPLANTABLE DEVICE N/A 08/29/2015   Procedure: Pacemaker Implant;  Surgeon: Evans Lance, MD;  Location: [place] CV LAB;  Service: Cardiovascular;  Laterality: N/A;   EP IMPLANTABLE DEVICE N/A 12/07/2015   Procedure: PPM Lead Revision/Repair;  Surgeon: Evans Lance, MD;  Location: [place] CV LAB;  Service: Cardiovascular;  Laterality: N/A;   ESOPHAGOGASTRODUODENOSCOPY (EGD) WITH PROPOFOL N/A 10/18/2020   Castaneda: normal esophagus, erythematous mucosa in gastric body, non bleeding duodenal ulcer with a non bleeding visible vessel (forrest Class IIa). Injected, clip placed, no specimens collected.   ESOPHAGOGASTRODUODENOSCOPY (EGD) WITH PROPOFOL N/A 01/27/2021   Procedure: ESOPHAGOGASTRODUODENOSCOPY (EGD) WITH PROPOFOL;  Surgeon: [name], DO;  [facility];  Service: Endoscopy;  Laterality: N/A;   IR ANGIOGRAM SELECTIVE EACH ADDITIONAL VESSEL  10/19/2020   IR ANGIOGRAM SELECTIVE EACH ADDITIONAL VESSEL  10/19/2020   IR  ANGIOGRAM VISCERAL SELECTIVE  10/19/2020   IR ANGIOGRAM VISCERAL SELECTIVE  10/19/2020   IR EMBO ART  VEN HEMORR LYMPH EXTRAV  INC GUIDE ROADMAPPING  10/19/2020   IR US GUIDE VASC ACCESS RIGHT  10/19/2020   KNEE ARTHROSCOPY  03/27/2003   left   LUMBAR SPINE SURGERY     "I've had 6 ORs 1972 thru 2004"   POLYPECTOMY  04/17/2017   Procedure: POLYPECTOMY;  Surgeon: Rogene Houston, MD;  [facility];  Service: Endoscopy;;  transverse colon x3;   RIGHT HEART CATH N/A 04/28/2020   Procedure: RIGHT HEART CATH;  Surgeon: Jolaine Artist, MD;  Location: [place] CV LAB;  Service: Cardiovascular;  Laterality: N/A;   RIGHT/LEFT HEART CATH AND CORONARY ANGIOGRAPHY N/A 12/21/2019   Procedure: RIGHT/LEFT HEART CATH AND CORONARY ANGIOGRAPHY;  Surgeon: Nelva Bush, MD;  Location: [place]  CV LAB;  Service: Cardiovascular;  Laterality: N/A;   TEE WITHOUT CARDIOVERSION N/A 11/05/2019   Procedure: TRANSESOPHAGEAL ECHOCARDIOGRAM (TEE) WITH PROPOFOL;  Surgeon: Arnoldo Lenis, MD;  [facility];  Service: Endoscopy;  Laterality: N/A;   TEE WITHOUT CARDIOVERSION N/A 05/03/2020   Procedure: TRANSESOPHAGEAL ECHOCARDIOGRAM (TEE);  Surgeon: Burnell Blanks, MD;  Location: [place];  Service: Open Heart Surgery;  Laterality: N/A;     Home Medications:  Prior to Admission medications   Medication Sig Start Date End Date Taking? Authorizing Provider  acetaminophen (TYLENOL) 325 MG tablet Take 2 tablets (650 mg total) by mouth every 4 (four) hours as needed for headache or mild pain. 05/15/20  Yes Isaiah Serge, NP  allopurinol (ZYLOPRIM) 300 MG tablet Take 150 mg by mouth as needed.   Yes [provider]  aspirin EC 81 MG tablet Take 1 tablet (81 mg total) by mouth daily with breakfast. 10/25/20 10/25/21 Yes Emokpae, Courage, MD  benzonatate (TESSALON) 100 MG capsule Take 1 capsule (100 mg total) by mouth 3 (three) times daily as needed for cough. 09/27/20  Yes Evans Lance, MD  carvedilol (COREG) 3.125 MG tablet Take 1 tablet (3.125 mg total) by mouth 2 (two) times daily with a meal. 01/30/21  Yes Milford, Maricela Bo, FNP  colchicine 0.6 MG tablet Take 1 tablet (0.6 mg total) by mouth daily as needed. 05/15/20  Yes Isaiah Serge, NP  Dulaglutide (TRULICITY) 1.5 DJ/4.9FW SOPN INJECT 1.5MG (1 PEN) SUBCUTANEOUSLY EVERY WEEK 10/05/20  Yes Nida, Marella Chimes, MD  ferrous sulfate 325 (65 FE) MG tablet Take 1 tablet (325 mg total) by mouth daily with breakfast. 01/29/21  Yes Tat, Shanon Brow, MD  hydrocortisone cream 1 % Apply 1 application topically daily.   Yes [provider]  insulin degludec (TRESIBA FLEXTOUCH) 100 UNIT/ML FlexTouch Pen Inject 40 Units into the skin at bedtime. 11/01/20  Yes Cassandria Anger, MD  linaclotide Rolan Lipa) 145 MCG CAPS capsule Take 1 capsule (145 mcg total) by mouth daily before breakfast. 01/26/21  Yes Montez Morita, Quillian Quince, MD  metolazone (ZAROXOLYN) 2.5 MG tablet Take 1 tablet (2.5 mg total) by mouth once a week. Every Monday 01/30/21  Yes Milford, Maricela Bo, FNP  Multiple Vitamins-Minerals (ONE-A-DAY MENS 50+) TABS Take 1 tablet by mouth daily with breakfast. Men   Yes [provider]  pantoprazole (PROTONIX) 40 MG tablet Take 1 tablet (40 mg total) by mouth 2 (two) times daily. 01/29/21  Yes Tat, Shanon Brow, MD  potassium chloride SA (KLOR-CON M20) 20 MEQ tablet Take 1.5 tablets (30 mEq total) by mouth 2 (two) times daily. Take 2 extra tabs on Mon with metolazone 12/12/20  Yes Bensimhon, Shaune Pascal, MD  spironolactone (ALDACTONE) 25 MG tablet Take 0.5 tablets (12.5 mg total) by mouth daily. 07/20/20  Yes Bensimhon, Shaune Pascal, MD  sucralfate (CARAFATE) 1 g tablet Take 1 tablet (1 g total) by mouth 4 (four) times daily. 10/22/20 10/22/21 Yes Emokpae, Courage, MD  torsemide (DEMADEX) 20 MG tablet TAKE 3 TABLETS (60 MG TOTAL) BY MOUTH 2 (TWO) TIMES DAILY. Patient taking differently: Take 20 mg by mouth 2 (two) times daily. 11/01/20   Yes Bensimhon, Shaune Pascal, MD  ACCU-CHEK AVIVA PLUS test strip TEST BLOOD SUGAR TWICE DAILY BEFORE BREAKFAST AND AT BEDTIME 07/19/20   Cassandria Anger, MD  camphor-menthol (ANTI-ITCH) lotion Apply 1 application topically daily as needed for itching.    [provider]  cefdinir (OMNICEF) 300 MG capsule Take 1  capsule (300 mg total) by mouth every 12 (twelve) hours. Patient not taking: No sig reported 01/29/21   Tat, Shanon Brow, MD  Insulin Pen Needle (BD PEN NEEDLE NANO U/F) 32G X 4 MM MISC 1 each by Does not apply route 4 (four) times daily. 12/23/18   Cassandria Anger, MD  metroNIDAZOLE (FLAGYL) 500 MG tablet Take 1 tablet (500 mg total) by mouth every 12 (twelve) hours. Patient not taking: No sig reported 01/29/21   Tat, Shanon Brow, MD  polyethylene glycol-electrolytes (TRILYTE) 420 g solution Take 4,000 mLs by mouth as directed. 02/01/21   Harvel Quale, MD    Inpatient Medications: Scheduled Meds:  sodium chloride   Intravenous Once   sodium chloride   Intravenous Once   bisacodyl  10 mg Rectal Once   carvedilol  3.125 mg Oral BID WC   Chlorhexidine Gluconate Cloth  6 each Topical Daily   hydrocortisone cream  1 application Topical QID   insulin aspart  0-9 Units Subcutaneous TID WC   insulin glargine-yfgn  40 Units Subcutaneous QHS   linaclotide  145 mcg Oral QAC breakfast   metolazone  2.5 mg Oral Weekly   pantoprazole (PROTONIX) IV  40 mg Intravenous Q12H   polyethylene glycol  17 g Oral Once   potassium chloride  40 mEq Oral Once   sodium chloride flush  3 mL Intravenous Q12H   sodium chloride flush  3 mL Intravenous Q12H   sucralfate  1 g Oral QID   torsemide  20 mg Oral BID   Continuous Infusions:  sodium chloride     sodium chloride Stopped (02/08/21 0150)   cefTRIAXone (ROCEPHIN)  IV Stopped (02/07/21 1745)   norepinephrine (LEVOPHED) Adult infusion Stopped (02/08/21 0150)   PRN Meds: sodium chloride, acetaminophen **OR** acetaminophen, bisacodyl,  camphor-menthol, hydrALAZINE, HYDROmorphone (DILAUDID) injection, hydrOXYzine, ipratropium, levalbuterol, ondansetron **OR** ondansetron (ZOFRAN) IV, oxyCODONE, senna-docusate, sodium chloride flush, traZODone  Allergies:   No Known Allergies  Social History:   Social History   Tobacco Use   Smoking status: Former    Packs/day: 1.00    Years: 10.00    Pack years: 10.00    Types: Cigarettes    Quit date: 03/26/1986    Years since quitting: 34.8   Smokeless tobacco: Never   Tobacco comments:    "stopped cigarette  smoking 1988"  Substance Use Topics   Alcohol use: Not Currently    Alcohol/week: 0.0 standard drinks    Comment: "quit alcohol ~ 2007"     Family History:    Family History  Problem Relation Age of Onset   Heart attack Mother    Hypertension Mother    Heart attack Father    Hypertension Father    Heart attack Brother    Colon cancer Neg Hx      ROS:  Please see the history of present illness.  General:no colds or fevers, no weight changes Skin:no rashes or ulcers HEENT:no blurred vision, no congestion CV:see HPI PUL:see HPI GI:no diarrhea constipation ++ melena, no indigestion GU:no hematuria, no dysuria MS:no joint pain, no claudication Neuro:no syncope, no lightheadedness Endo:+ diabetes, no thyroid disease  All other ROS reviewed and negative.     Physical Exam/Data:   Vitals:   02/08/21 0630 02/08/21 0700 02/08/21 0800 02/08/21 0830  BP: 119/74 (!) 113/50 (!) 85/59 121/85  Pulse: (!) 52 61 61 (!) 50  Resp: 14 18 20  (!) 21  Temp:      TempSrc:      SpO2:  98% 99% 100% 100%  Weight:      Height:        Intake/Output Summary (Last 24 hours) at 02/08/2021 0931 Last data filed at 02/08/2021 0750 Gross per 24 hour  Intake 760.89 ml  Output 1950 ml  Net -1189.11 ml   Last 3 Weights 02/08/2021 02/07/2021 02/06/2021  Weight (lbs) 206 lb 9.1 oz 207 lb 0.2 oz 212 lb 8.4 oz  Weight (kg) 93.7 kg 93.9 kg 96.4 kg     Body mass index is 28.02  kg/m.  General:  Well nourished, well developed, in no acute distress resting comfortably in bed.  HEENT: normal Neck: + JVD at 20 degrees Vascular: No carotid bruits; Distal pulses 2+ bilaterally Cardiac:  normal S1, S2; RRR; + murmur no gallup or rub Lungs:  clear to auscultation bilaterally, no wheezing, rhonchi or rales - ant position Abd: soft, nontender, no hepatomegaly  Ext: no edema Musculoskeletal:  No deformities, BUE and BLE strength normal and equal Skin: warm and dry  Neuro:  alert and oriented X 3 MAE follows commands, no focal abnormalities noted Psych:  Normal affect   Relevant CV Studies:    [facility] 9/21 No CAD. Moderately elevated filling pressures and moderately reduced CO. CI 1.8. Also concern for RV pacing CM with 42% RV pacing    Echo 01/01/21 IMPRESSIONS    1. Left ventricular ejection fraction, by estimation, is 30 to 35%. The  left ventricle has moderately decreased function. The left ventricle  demonstrates global hypokinesis. The left ventricular internal cavity size  was mildly dilated. Left ventricular  diastolic function could not be evaluated.   2. Right ventricular systolic function is low normal. The right  ventricular size is moderately enlarged. There is mildly elevated  pulmonary artery systolic pressure.   3. Left atrial size was severely dilated.   4. Right atrial size was severely dilated.   5. The mitral valve is normal in structure. Mild to moderate mitral valve  regurgitation.   6. Tricuspid valve regurgitation is moderate.   7. The aortic valve has been repaired/replaced. Aortic valve  regurgitation is not visualized. No aortic stenosis is present. There is a  29 mm Edwards Sapien prosthetic (TAVR) valve present in the aortic  position. Aortic valve mean gradient measures 10.0   mmHg. Aortic valve Vmax measures 2.13 m/s.   8. The inferior vena cava is dilated in size with <50% respiratory  variability, suggesting right atrial pressure  of 15 mmHg.   Comparison(s): No significant change from prior study. Prior images  reviewed side by side.   FINDINGS   Left Ventricle: Left ventricular ejection fraction, by estimation, is 30  to 35%. The left ventricle has moderately decreased function. The left  ventricle demonstrates global hypokinesis. The left ventricular internal  cavity size was mildly dilated.  There is no left ventricular hypertrophy. Abnormal (paradoxical) septal  motion, consistent with RV pacemaker. Left ventricular diastolic function  could not be evaluated due to atrial fibrillation. Left ventricular  diastolic function could not be  evaluated.   Right Ventricle: The right ventricular size is moderately enlarged. No  increase in right ventricular wall thickness. Right ventricular systolic  function is low normal. There is mildly elevated pulmonary artery systolic  pressure. The tricuspid regurgitant   velocity is 2.40 m/s, and with an assumed right atrial pressure of 15  mmHg, the estimated right ventricular systolic pressure is 50.2 mmHg.   Left Atrium: Left atrial size was severely dilated.  Right Atrium: Right atrial size was severely dilated.   Pericardium: There is no evidence of pericardial effusion.   Mitral Valve: The mitral valve is normal in structure. Mild to moderate  mitral valve regurgitation, with centrally-directed jet.   Tricuspid Valve: The tricuspid valve is normal in structure. Tricuspid  valve regurgitation is moderate.   Aortic Valve: The aortic valve has been repaired/replaced. Aortic valve  regurgitation is not visualized. No aortic stenosis is present. Aortic  valve mean gradient measures 10.0 mmHg. Aortic valve peak gradient  measures 18.1 mmHg. Aortic valve area, by  VTI measures 1.41 cm. There is a 29 mm Edwards Sapien prosthetic, stented  (TAVR) valve present in the aortic position.  Laboratory Data:  High Sensitivity Troponin:   Recent Labs  Lab 02/07/21 0954  02/07/21 1135  TROPONINIHS 74* 69*     Chemistry Recent Labs  Lab 02/06/21 1300 02/07/21 0957 02/08/21 0430  NA 126* 133* 133*  K 5.3* 3.6 3.3*  CL 97* 100 100  CO2 21* 23 25  GLUCOSE 276* 167* 89  BUN 98* 111* 108*  CREATININE 2.24* 1.99* 2.14*  CALCIUM 7.9* 8.8* 8.3*  GFRNONAA 30* 34* 31*  ANIONGAP 8 10 8     Recent Labs  Lab 02/06/21 1300  PROT 6.1*  ALBUMIN 2.6*  AST 27  ALT 16  ALKPHOS 176*  BILITOT 2.3*    Hematology Recent Labs  Lab 02/06/21 1300 02/07/21 0957 02/08/21 0014 02/08/21 0430  WBC 8.1 11.7*  --  8.5  RBC 2.48* 3.04*  --  2.46*  HGB 6.4* 8.4* 6.7* 6.7*  HCT 21.6* 25.1* 19.8* 20.8*  MCV 87.1 82.6  --  84.6  MCH 25.8* 27.6  --  27.2  MCHC 29.6* 33.5  --  32.2  RDW 22.6* 21.2*  --  21.4*  PLT 176 189  --  139*    BNP Recent Labs  Lab 02/06/21 1300  BNP 1,516.0*     Radiology/Studies:  No results found.   Assessment and Plan:   Acute GI bleed has rec'd a total of 4 units PRBCs last one infusing.  Hgb this AM 6.7 .   He was on eliquis for his permanent atrial fib but was stopped with first GI bleed and not yet resumed. Pt having capsule endo now.  NICM/chronic systolic HF with EF 81-10% currently no SOB.  With increased Cr will hold diuretic torsemide and metolazone but will need to monitor closely.  Daily wts, strict I&O.   Wt is down to 206 lbs.  No appetite with GI bleed.  He is neg 4,447 and with wt decrease.   Chronic atrial fib rate controlled with PPM pacing at 60.  Is having PVCs.  Hx of TAVR 04/2020 stable AV on echo in Oct.  PPM for CHB in 2017 unable to upgrade to CRT due to occluded subclavian vein.  Elevated troponin at 74 with no CAD on cath 2021, demand ischemia due to acute anemia.  Did have mild chest pain yesterday for same reason.   Recent diverticulitis 01/27/21 on ABX.  Per GI DM-2 jper IM   Risk Assessment/Risk Scores:      CHA2DS2-VASc Score = 5   This indicates a 7.2% annual risk of stroke. The patient's  score is based upon: CHF History: 1 HTN History: 1 Diabetes History: 1 Stroke History: 0 Vascular Disease History: 1 Age Score: 1 Gender Score: 0     For questions or updates, please contact [facility] Please consult [email or web address] for  contact info under    Signed, Cecilie Kicks, NP  02/08/2021 9:31 AM    Attending note:  Patient seen and examined.  I reviewed his records and discussed the case with Ms. Dorene Ar NP, I agree with her above findings.  Mr. Terral has a history of nonischemic cardiomyopathy, complete heart block with pacemaker in place, aortic stenosis status post TAVR in February, CKD stage IIIb, and atrial flutter.  He is currently admitted to the hospital with GI bleed, presented with melena and a hemoglobin down to 6.4.  He has been evaluated by gastroenterology, capsule endoscopy initiated and receiving PRBC transfusions.  Cardiology service consulted due to his cardiac history to assist with medication adjustments.  He was kept on low-dose diuretics, hypotensive overnight requiring temporary pressor support, although blood pressure stable this morning.  Creatinine up to 2.24 initially and down to 2.14 today.  Of note, he is not anticoagulated long-term due to previous history of GI bleeding.  He is afebrile, heart rate is in the 50s to 60s with ventricular pacing and PVCs as well as brief bursts of NSVT by telemetry which I personally reviewed.  Systolic blood pressure 832N this morning.  Lungs are clear.  Cardiac exam with RRR and frequent ectopy, indistinct PMI, soft apical systolic murmur.  No peripheral edema involving the left leg.  Pertinent lab work includes sodium 133, potassium 3.3, BUN 108, creatinine 2.14, high-sensitivity troponin I levels in the 60s to 70s, hemoglobin 6.7 at this point, platelets 139.  BNP 1516.  Fecal occult positive.  Abdominal pelvic CT imaging suggest mild acute uncomplicated sigmoid diverticulitis with pancolonic diverticulosis, also  hepatic cirrhosis (known history of NASH).  His last echocardiogram in October revealed LVEF 30 to 35% range with global hypokinesis, low normal RV contraction, severe biatrial enlargement, stable TAVR prosthesis with mean gradient 10 mmHg.  I personally viewed his ECG from November 14 which shows a ventricular paced rhythm.  Patient's outpatient cardiac regimen is limited at baseline in the setting of CKD, also prior yeast infections on Farxiga.  He is on Coreg, once weekly Zaroxolyn, Demadex 30 mg twice daily, aspirin, and Aldactone 12.5 mg daily.  For the time being I would completely hold diuretics and follow-up on a daily basis in terms of fluid status, he is intravascularly depleted in the setting of GI bleed.  Can continue Coreg at low-dose.  Satira Sark, M.D., F.A.C.C.

## 2021-02-09 ENCOUNTER — Encounter (HOSPITAL_COMMUNITY)
Admission: EM | Disposition: A | Discharge status: Home Health | Payer: Self-pay | Source: Home / Self Care | Attending: Internal Medicine

## 2021-02-09 ENCOUNTER — Inpatient Hospital Stay (HOSPITAL_COMMUNITY): Payer: Medicare HMO

## 2021-02-09 ENCOUNTER — Encounter (HOSPITAL_COMMUNITY): Payer: Medicare HMO

## 2021-02-09 ENCOUNTER — Inpatient Hospital Stay (HOSPITAL_COMMUNITY): Payer: Medicare HMO | Admitting: Certified Registered Nurse Anesthetist

## 2021-02-09 ENCOUNTER — Encounter (HOSPITAL_COMMUNITY): Payer: Self-pay | Admitting: Internal Medicine

## 2021-02-09 DIAGNOSIS — D649 Anemia, unspecified: Secondary | ICD-10-CM | POA: Diagnosis not present

## 2021-02-09 DIAGNOSIS — I484 Atypical atrial flutter: Secondary | ICD-10-CM

## 2021-02-09 DIAGNOSIS — I5043 Acute on chronic combined systolic (congestive) and diastolic (congestive) heart failure: Secondary | ICD-10-CM | POA: Diagnosis not present

## 2021-02-09 DIAGNOSIS — I5020 Unspecified systolic (congestive) heart failure: Secondary | ICD-10-CM

## 2021-02-09 DIAGNOSIS — K922 Gastrointestinal hemorrhage, unspecified: Secondary | ICD-10-CM | POA: Diagnosis not present

## 2021-02-09 HISTORY — PX: ESOPHAGOGASTRODUODENOSCOPY (EGD) WITH PROPOFOL: SHX5813

## 2021-02-09 LAB — TYPE AND SCREEN
ABO/RH(D): B POS
Antibody Screen: POSITIVE
Unit division: 0
Unit division: 0
Unit division: 0
Unit division: 0

## 2021-02-09 LAB — BASIC METABOLIC PANEL
Anion gap: 9 (ref 5–15)
BUN: 102 mg/dL — ABNORMAL HIGH (ref 8–23)
CO2: 23 mmol/L (ref 22–32)
Calcium: 8.5 mg/dL — ABNORMAL LOW (ref 8.9–10.3)
Chloride: 102 mmol/L (ref 98–111)
Creatinine, Ser: 2.16 mg/dL — ABNORMAL HIGH (ref 0.61–1.24)
GFR, Estimated: 31 mL/min — ABNORMAL LOW (ref >60)
Glucose, Bld: 89 mg/dL (ref 70–99)
Potassium: 3.9 mmol/L (ref 3.5–5.1)
Sodium: 134 mmol/L — ABNORMAL LOW (ref 135–145)

## 2021-02-09 LAB — GLUCOSE, CAPILLARY
Glucose-Capillary: 80 mg/dL (ref 70–99)
Glucose-Capillary: 84 mg/dL (ref 70–99)
Glucose-Capillary: 70 mg/dL (ref 70–99)
Glucose-Capillary: 60 mg/dL — ABNORMAL LOW (ref 70–99)
Glucose-Capillary: 94 mg/dL (ref 70–99)
Glucose-Capillary: 160 mg/dL — ABNORMAL HIGH (ref 70–99)

## 2021-02-09 LAB — CBC
HCT: 27.5 % — ABNORMAL LOW (ref 39.0–52.0)
Hemoglobin: 8.8 g/dL — ABNORMAL LOW (ref 13.0–17.0)
MCH: 27.5 pg (ref 26.0–34.0)
MCHC: 32 g/dL (ref 30.0–36.0)
MCV: 85.9 fL (ref 80.0–100.0)
Platelets: 144 10*3/uL — ABNORMAL LOW (ref 150–400)
RBC: 3.2 MIL/uL — ABNORMAL LOW (ref 4.22–5.81)
RDW: 21.3 % — ABNORMAL HIGH (ref 11.5–15.5)
WBC: 8 10*3/uL (ref 4.0–10.5)
nRBC: 0.4 % — ABNORMAL HIGH (ref 0.0–0.2)

## 2021-02-09 SURGERY — ESOPHAGOGASTRODUODENOSCOPY (EGD) WITH PROPOFOL
Anesthesia: General

## 2021-02-09 MED ORDER — ETOMIDATE 2 MG/ML IV SOLN
INTRAVENOUS | Status: DC | PRN
  Administered 2021-02-09: 4 mg via INTRAVENOUS
  Administered 2021-02-09: 6 mg via INTRAVENOUS

## 2021-02-09 MED ORDER — LACTATED RINGERS IV SOLN
INTRAVENOUS | Status: DC | PRN
Start: 2021-02-09 — End: 2021-02-09

## 2021-02-09 MED ORDER — PROPOFOL 10 MG/ML IV BOLUS
INTRAVENOUS | Status: DC | PRN
  Administered 2021-02-09: 20 mg via INTRAVENOUS
  Administered 2021-02-09: 40 mg via INTRAVENOUS

## 2021-02-09 MED ORDER — LIDOCAINE HCL (CARDIAC) PF 100 MG/5ML IV SOSY
PREFILLED_SYRINGE | INTRAVENOUS | Status: DC | PRN
  Administered 2021-02-09: 50 mg via INTRAVENOUS

## 2021-02-09 NOTE — Plan of Care (Signed)

## 2021-02-09 NOTE — Progress Notes (Signed)
0808 Received call from endo & will be coming shortly to get patient for EGD, patient & patient's wife Lelan Pons) made aware

## 2021-02-09 NOTE — Progress Notes (Signed)
1000 Received report on the patient from endo RN Barnett Applebaum) post EGD with no active bleeding noted, patient due to return to the ICU soon

## 2021-02-09 NOTE — TOC Initial Note (Signed)
Transition of Care [facility]) - Initial/Assessment Note    Patient Details  Name: John [name] MRN: [id number] Date of Birth: [birth year]  Transition of Care [facility]) CM/SW Contact:    Iona Beard, [place] Phone Number: 02/09/2021, 11:47 AM  Clinical Narrative:                 Pt is high risk for readmission an PT has recommended [facility] PT for pt at D/C. CSW met with pt and wife in room to complete assessment. Pt states that he lives with his wife. Pt is independent in completing his ADLs and provides his own transportation. Pt has had [place] services with [name], [place] spoke to [facility] rep Tommi Rumps who states that pt is currently active with [facility] PT services. Pt also has a cane, walker, and wheelchair to use in the home if needed. Pt and wife agreeable to [facility] services at d/c and would like to continue with [facility]. TOC to follow.   Expected Discharge Plan: [place] Barriers to Discharge: Continued Medical Work up   Patient Goals and CMS Choice Patient states their goals for this hospitalization and ongoing recovery are:: Return home with [facility] CMS Medicare.gov Compare Post Acute Care list provided to:: Patient Choice offered to / list presented to : Patient  Expected Discharge Plan and Services Expected Discharge Plan: [place] In-house Referral: Clinical Social Work Discharge Planning Services: CM Consult Post Acute Care Choice: [place] arrangements for the past 2 months: Single Family Home Expected Discharge Date: 02/09/21                                    Prior Living Arrangements/Services Living arrangements for the past 2 months: Single Family Home Lives with:: Spouse Patient language and need for interpreter reviewed:: Yes Do you feel safe going back to the place where you live?: Yes      Need for Family Participation in Patient Care: Yes (Comment) Care giver support system in place?: Yes (comment) Current home services: Home OT, Home  PT Criminal Activity/Legal Involvement Pertinent to Current Situation/Hospitalization: No - Comment as needed  Activities of Daily Living Home Assistive Devices/Equipment: Cane (specify quad or straight), Walker (specify type) ADL Screening (condition at time of admission) Patient's cognitive ability adequate to safely complete daily activities?: No Is the patient deaf or have difficulty hearing?: No Does the patient have difficulty seeing, even when wearing glasses/contacts?: No Does the patient have difficulty concentrating, remembering, or making decisions?: No Patient able to express need for assistance with ADLs?: Yes Does the patient have difficulty dressing or bathing?: No Independently performs ADLs?: Yes (appropriate for developmental age) Does the patient have difficulty walking or climbing stairs?: No Weakness of Legs: None Weakness of Arms/Hands: None  Permission Sought/Granted                  Emotional Assessment Appearance:: Appears stated age Attitude/Demeanor/Rapport: Engaged Affect (typically observed): Accepting Orientation: : Oriented to Self, Oriented to Place, Oriented to  Time, Oriented to Situation Alcohol / Substance Use: Not Applicable Psych Involvement: No (comment)  Admission diagnosis:  Acute GI bleeding [K92.2] Patient Active Problem List   Diagnosis Date Noted   Acute GI bleeding 02/06/2021   Melena 01/31/2021   Liver cirrhosis ([place]) 01/31/2021   Diverticulitis of colon 01/31/2021   Acute diverticulitis 01/26/2021   Acute on chronic systolic (congestive) heart  failure ([place]) 12/31/2020   SOB (shortness of breath) 12/31/2020   GERD (gastroesophageal reflux disease) 12/31/2020   History of anemia due to chronic kidney disease 12/31/2020   Duodenal ulcer 11/22/2020   Iron deficiency anemia due to chronic blood loss 11/22/2020   Itching 11/22/2020   Anemia    Hematoma, chest wall 05/15/2020   Polyarticular gout 05/15/2020   S/P TAVR  (transcatheter aortic valve replacement) 05/03/2020   Severe aortic stenosis 04/25/2020   Chronic atrial fibrillation ([place]) 04/25/2020   Acute on chronic systolic CHF (congestive heart failure) ([place]) 04/25/2020   Permanent atrial fibrillation ([place]) 03/10/2020   Calculus of gallbladder without cholecystitis without obstruction    Thrombocytopenia ([place]) 03/09/2020   Hypoalbuminemia 03/09/2020   NASH (nonalcoholic steatohepatitis) 03/03/2020   Lower leg edema 10/14/2019   Class 2 obesity    Acute on chronic combined systolic and diastolic CHF (congestive heart failure) ([place]) 11/12/2018   Hyponatremia 11/11/2018   CKD (chronic kidney disease) stage 3, GFR 30-59 ml/min (HCC)    Paroxysmal atrial fibrillation ([place])    Pacemaker 03/02/2018   Atrial pacemaker lead displacement 12/07/2015   NSVT (nonsustained ventricular tachycardia) 08/28/2015   Bradycardia 08/25/2015   Current use of long term anticoagulation 05/04/2011   Atrial flutter ([place]) 01/05/2011   DM type 2 causing vascular disease ([place]) 12/23/2009   Mixed hyperlipidemia 12/23/2009   Essential hypertension, benign 12/23/2009   PCP:  [name], MD Pharmacy:   [id number] [place], [address]Phone: [phone]: [phone] [address] [address] [place]Phone: [phone]: [phone]    Social Determinants of Health (SDOH) Interventions    Readmission Risk Interventions Readmission Risk Prevention Plan 02/09/2021 10/18/2020 11/14/2018  Transportation Screening Complete Complete Complete  PCP or Specialist Appt within 3-5 Days - - Complete  [place]RI or [place]- - Complete  Social Work Consult for [place]/Counseling - - Complete  Palliative Care Screening - - Not Applicable  Medication Review (Press photographer Complete Complete  Complete  HRI or Home Care Consult Complete Complete -  SW Recovery Care/Counseling Consult Complete Complete -  Palliative Care Screening Not Applicable Not Applicable -  [place] Applic[place] Not Applicable -  Some recent data might be hidden

## 2021-02-09 NOTE — Transfer of Care (Signed)
Immediate Anesthesia Transfer of Care Note  Patient: John [name]  Procedure(s) Performed: ESOPHAGOGASTRODUODENOSCOPY (EGD) WITH PROPOFOL  Patient Location: PACU  Anesthesia Type:General  Level of Consciousness: drowsy  Airway & Oxygen Therapy: Patient Spontanous Breathing  Post-op Assessment: Report given to RN and Post -op Vital signs reviewed and stable  Post vital signs: Reviewed and stable  Last Vitals:  Vitals Value Taken Time  BP 101/57   Temp 98.2   Pulse 60   Resp 16   SpO2 96     Last Pain:  Vitals:   02/09/21 0806  TempSrc:   PainSc: 0-No pain         Complications: No notable events documented.

## 2021-02-09 NOTE — Interval H&P Note (Signed)
History and Physical Interval Note:  02/09/2021 9:18 AM  John [name]  has presented today for surgery, with the diagnosis of UGI Bleed documented on Given capsule..  The various methods of treatment have been discussed with the patient and family. After consideration of risks, benefits and other options for treatment, the patient has consented to  Procedure(s): ESOPHAGOGASTRODUODENOSCOPY (EGD) WITH PROPOFOL (N/A) as a surgical intervention.  The patient's history has been reviewed, patient examined, no change in status, stable for surgery.  I have reviewed the patient's chart and labs.  Questions were answered to the patient's satisfaction.     [name]

## 2021-02-09 NOTE — Progress Notes (Signed)
PT Cancellation Note  Patient Details Name: John [name] MRN: [id number] DOB: 1945-10-07   Cancelled Treatment:    Reason Eval/Treat Not Completed: Fatigue/lethargy limiting ability to participate.  Patient declined therapy secondary to c/o fatigue after having procedure earlier today.  Will check back tomorrow.   3:38 PM, 02/09/21 Lonell Grandchild, MPT Physical Therapist with [facility] [phone] office [phone] mobile phone

## 2021-02-09 NOTE — Op Note (Signed)
[facility] Patient Name: John [name] Procedure Date: 02/09/2021 9:09 AM MRN: [id number] Date of Birth: Oct 14, 1945 Attending MD: Elon Alas. Abbey Chatters DO CSN: [id number] Age: 75 Admit Type: Outpatient Procedure:                Upper GI endoscopy Indications:              Acute post hemorrhagic anemia, Melena Providers:                Elon Alas. Abbey Chatters, DO, Charlsie Quest. Insurance claims handler, Therapist, sports,                            Suzan Garibaldi. Risa Grill, Technician Referring MD:              Medicines:                See the Anesthesia note for documentation of the                            administered medications Complications:            No immediate complications. Estimated Blood Loss:     Estimated blood loss: none. Procedure:                Pre-Anesthesia Assessment:                           - The anesthesia plan was to use monitored                            anesthesia care (MAC).                           After obtaining informed consent, the endoscope was                            passed under direct vision. Throughout the                            procedure, the patient's blood pressure, pulse, and                            oxygen saturations were monitored continuously. The                            [id number]-H190 ([id number]) scope was introduced through the                            mouth, and advanced to the second part of duodenum.                            The upper GI endoscopy was accomplished without                            difficulty. The patient tolerated the procedure  well. Scope In: 9:30:06 AM Scope Out: 9:35:36 AM Total Procedure Duration: 0 hours 5 minutes 30 seconds  Findings:      There is no endoscopic evidence of bleeding, inflammation or varices in       the entire esophagus.      A non-obstructing Schatzki ring was found in the distal esophagus.      A small hiatal hernia was present.      Moderate portal hypertensive gastropathy was  found in the gastric fundus       and in the gastric body.      There is no endoscopic evidence of bleeding in the entire examined       stomach.      A 20 mm healed ulcer was found in the duodenal bulb. The scar tissue was       healthy in appearance. No bleeding or stigmata of bleeding. Impression:               - Non-obstructing Schatzki ring.                           - Small hiatal hernia.                           - Portal hypertensive gastropathy.                           - Duodenal scar.                           - No specimens collected. Moderate Sedation:      Per Anesthesia Care Recommendation:           - Return patient to hospital ward for ongoing care.                           - Advance diet as tolerated.                           - No active or stigmata of bleeding found on                            today's exam. Etiology of his recurrent GI bleeding                            remains elusive. Possible ooze from his portal                            hypertensive gastropathy. Possible Dieulafoy                            lesion. Continue to monitor. If patient has                            evidence of further bleeding, would recommend                            tagged RBC scan. We will plan on outpatient  colonoscopy. He may benefit from repeat capsule                            endoscopy as well as his exam was incomplete                            inpatient. Procedure Code(s):        --- Professional ---                           [phone], Esophagogastroduodenoscopy, flexible,                            transoral; diagnostic, including collection of                            specimen(s) by brushing or washing, when performed                            (separate procedure) Diagnosis Code(s):        --- Professional ---                           K22.2, Esophageal obstruction                           K44.9, Diaphragmatic hernia without  obstruction or                            gangrene                           K76.6, Portal hypertension                           K31.89, Other diseases of stomach and duodenum                           D62, Acute posthemorrhagic anemia                           K92.1, Melena (includes Hematochezia) CPT copyright 2019 American Medical Association. All rights reserved. The codes documented in this report are preliminary and upon coder review may  be revised to meet current compliance requirements. Elon Alas. Abbey Chatters, DO [place] Abbey Chatters, DO 02/09/2021 9:42:35 AM This report has been signed electronically. Number of Addenda: 0

## 2021-02-09 NOTE — Anesthesia Preprocedure Evaluation (Signed)
Anesthesia Evaluation  Patient identified by MRN, date of birth, ID band Patient awake    Reviewed: Allergy & Precautions, H&P , NPO status , Patient's Chart, lab work & pertinent test results, reviewed documented beta blocker date and time   Airway Mallampati: II  TM Distance: >3 FB Neck ROM: full    Dental no notable dental hx.    Pulmonary neg pulmonary ROS, former smoker,    Pulmonary exam normal breath sounds clear to auscultation       Cardiovascular Exercise Tolerance: Good hypertension, +CHF  + pacemaker  Rhythm:irregular Rate:Bradycardia     Neuro/Psych negative neurological ROS  negative psych ROS   GI/Hepatic PUD, GERD  Medicated,(+) Hepatitis -, Autoimmune  Endo/Other  negative endocrine ROSdiabetes  Renal/GU CRFRenal disease  negative genitourinary   Musculoskeletal  (+) Arthritis ,   Abdominal   Peds  Hematology  (+) Blood dyscrasia, anemia ,   Anesthesia Other Findings   Reproductive/Obstetrics negative OB ROS                             Anesthesia Physical  Anesthesia Plan  ASA: 4 and emergent  Anesthesia Plan: General   Post-op Pain Management:    Induction:   PONV Risk Score and Plan: Propofol infusion  Airway Management Planned:   Additional Equipment:   Intra-op Plan:   Post-operative Plan:   Informed Consent: I have reviewed the patients History and Physical, chart, labs and discussed the procedure including the risks, benefits and alternatives for the proposed anesthesia with the patient or authorized representative who has indicated his/her understanding and acceptance.     Dental Advisory Given  Plan Discussed with: CRNA  Anesthesia Plan Comments:         Anesthesia Quick Evaluation

## 2021-02-09 NOTE — Anesthesia Postprocedure Evaluation (Signed)
Anesthesia Post Note  Patient: John [name]  Procedure(s) Performed: ESOPHAGOGASTRODUODENOSCOPY (EGD) WITH PROPOFOL  Patient location during evaluation: Phase II Anesthesia Type: General Level of consciousness: awake Pain management: pain level controlled Vital Signs Assessment: post-procedure vital signs reviewed and stable Respiratory status: spontaneous breathing and respiratory function stable Cardiovascular status: blood pressure returned to baseline and stable Postop Assessment: no headache and no apparent nausea or vomiting Anesthetic complications: no Comments: Late entry   No notable events documented.   Last Vitals:  Vitals:   02/09/21 0857 02/09/21 0940  BP: (!) 111/51 (!) 101/57  Pulse: (!) 53   Resp: 11 16  Temp:  36.8 C  SpO2: 97% 96%    Last Pain:  Vitals:   02/09/21 0940  TempSrc:   PainSc: [place]

## 2021-02-09 NOTE — Progress Notes (Signed)
Progress Note  Patient Name: John [name] Date of Encounter: 02/09/2021  Primary Cardiologist: [name], MD  Subjective   No shortness of breath or palpitations.  Going to EGD this morning.  Inpatient Medications    Scheduled Meds:  [MAR Hold] sodium chloride   Intravenous Once   [MAR Hold] carvedilol  3.125 mg Oral BID WC   [MAR Hold] Chlorhexidine Gluconate Cloth  6 each Topical Daily   [MAR Hold] hydrocortisone cream  1 application Topical QID   [MAR Hold] insulin aspart  0-9 Units Subcutaneous TID WC   [MAR Hold] insulin glargine-yfgn  40 Units Subcutaneous QHS   [MAR Hold] linaclotide  145 mcg Oral QAC breakfast   [MAR Hold] pantoprazole (PROTONIX) IV  40 mg Intravenous Q12H   [MAR Hold] sodium chloride flush  3 mL Intravenous Q12H   [MAR Hold] sodium chloride flush  3 mL Intravenous Q12H   [MAR Hold] sucralfate  1 g Oral QID   Continuous Infusions:  [MAR Hold] sodium chloride     sodium chloride Stopped (02/08/21 0150)   sodium chloride Stopped (02/08/21 1944)   [MAR Hold] cefTRIAXone (ROCEPHIN)  IV Stopped (02/08/21 1539)   [MAR Hold] norepinephrine (LEVOPHED) Adult infusion Stopped (02/08/21 0150)   octreotide  (SANDOSTATIN)    IV infusion 50 mcg/hr (02/08/21 2145)   PRN Meds: [IOE Hold] sodium chloride, [MAR Hold] acetaminophen **OR** [MAR Hold] acetaminophen, [MAR Hold] bisacodyl, [MAR Hold] camphor-menthol, [MAR Hold] hydrALAZINE, [MAR Hold]  HYDROmorphone (DILAUDID) injection, [MAR Hold] hydrOXYzine, [MAR Hold] ipratropium, [MAR Hold] levalbuterol, [MAR Hold] ondansetron **OR** [MAR Hold] ondansetron (ZOFRAN) IV, [MAR Hold] oxyCODONE, [MAR Hold] senna-docusate, [MAR Hold] sodium chloride flush, [MAR Hold] traZODone   Vital Signs    Vitals:   02/09/21 0500 02/09/21 0600 02/09/21 0745 02/09/21 0800  BP: (!) 76/48 (!) 122/35 (!) 118/57 (!) 112/43  Pulse: 76 66 67 (!) 44  Resp: (!) 21 19 15 14   Temp:   97.7 F (36.5 C)   TempSrc:   Oral   SpO2:  96% 92% 99% 99%  Weight: 95.6 kg     Height:        Intake/Output Summary (Last 24 hours) at 02/09/2021 0842 Last data filed at 02/08/2021 2145 Gross per 24 hour  Intake 357.46 ml  Output 425 ml  Net -67.54 ml   Filed Weights   02/07/21 0500 02/08/21 0500 02/09/21 0500  Weight: 93.9 kg 93.7 kg 95.6 kg    Telemetry    Ventricular pacing with intermittent PVCs and brief burst of NSVT.  Personally reviewed.  ECG    No ECG reviewed.  Physical Exam   GEN: No acute distress.   Neck: No JVD. Cardiac: RRR with frequent ectopy, 2/6 systolic murmur. Respiratory: Nonlabored. Clear to auscultation bilaterally. GI: Soft, nontender, bowel sounds present. MS: No edema left leg; No deformity.  Labs    Chemistry Recent Labs  Lab 02/06/21 1300 02/07/21 0957 02/08/21 0430 02/08/21 1407 02/09/21 0433  NA 126* 133* 133*  --  134*  K 5.3* 3.6 3.3*  --  3.9  CL 97* 100 100  --  102  CO2 21* 23 25  --  23  GLUCOSE 276* 167* 89  --  89  BUN 98* 111* 108*  --  102*  CREATININE 2.24* 1.99* 2.14*  --  2.16*  CALCIUM 7.9* 8.8* 8.3*  --  8.5*  PROT 6.1*  --   --  5.9*  --   ALBUMIN 2.6*  --   --  3.0*  --   AST 27  --   --  31  --   ALT 16  --   --  15  --   ALKPHOS 176*  --   --  121  --   BILITOT 2.3*  --   --  2.1*  --   GFRNONAA 30* 34* 31*  --  31*  ANIONGAP 8 10 8   --  9     Hematology Recent Labs  Lab 02/07/21 0957 02/08/21 0014 02/08/21 0430 02/08/21 1407 02/09/21 0433  WBC 11.7*  --  8.5  --  8.0  RBC 3.04*  --  2.46*  --  3.20*  HGB 8.4*   < > 6.7* 8.4* 8.8*  HCT 25.1*   < > 20.8* 25.2* 27.5*  MCV 82.6  --  84.6  --  85.9  MCH 27.6  --  27.2  --  27.5  MCHC 33.5  --  32.2  --  32.0  RDW 21.2*  --  21.4*  --  21.3*  PLT 189  --  139*  --  144*   < > = values in this interval not displayed.    Cardiac Enzymes Recent Labs  Lab 02/07/21 0954 02/07/21 1135  TROPONINIHS 74* 69*    BNP Recent Labs  Lab 02/06/21 1300  BNP 1,516.0*     Radiology     No results found.  Cardiac Studies   Echocardiogram 01/01/2021:  1. Left ventricular ejection fraction, by estimation, is 30 to 35%. The  left ventricle has moderately decreased function. The left ventricle  demonstrates global hypokinesis. The left ventricular internal cavity size  was mildly dilated. Left ventricular  diastolic function could not be evaluated.   2. Right ventricular systolic function is low normal. The right  ventricular size is moderately enlarged. There is mildly elevated  pulmonary artery systolic pressure.   3. Left atrial size was severely dilated.   4. Right atrial size was severely dilated.   5. The mitral valve is normal in structure. Mild to moderate mitral valve  regurgitation.   6. Tricuspid valve regurgitation is moderate.   7. The aortic valve has been repaired/replaced. Aortic valve  regurgitation is not visualized. No aortic stenosis is present. There is a  29 mm Edwards Sapien prosthetic (TAVR) valve present in the aortic  position. Aortic valve mean gradient measures 10.0   mmHg. Aortic valve Vmax measures 2.13 m/s.   8. The inferior vena cava is dilated in size with <50% respiratory  variability, suggesting right atrial pressure of 15 mmHg.   Assessment & Plan    1.  HFrEF with chronic systolic heart failure, nonischemic cardiomyopathy.  LVEF 30 to 35% and RV contraction low normal by most recent assessment in October.  2.  Permanent atrial fibrillation. CHA2DS2-VASc score 5. He is no longer anticoagulated with history of recurrent GI bleeding.  3.  History of severe aortic stenosis status post TAVR in February of this year.  Prosthetic function stable by recent echocardiogram with mean gradient 10 mmHg.  4.  Angiographically normal coronary arteries by cardiac catheterization in September 2021.  5.  GI bleed, melanotic stools noted.  He is on octreotide, capsules study incomplete, did not reach cecum.  Old blood and coffee-ground material  noted in the stomach with a few small free-floating clots in the small bowel.  He is undergoing EGD today.  Has received PRBC transfusions with hemoglobin up to 8.8.  Continue to hold diuretics and  follow fluid status clinically.  He is weight is up in general although he has had net negative output over the last 48 hours.  Blood pressure remains low to low normal.  Continue low-dose Coreg.  Signed, Rozann Lesches, MD  02/09/2021, 8:42 AM

## 2021-02-09 NOTE — Progress Notes (Signed)
[facility]                                                                              Patient Demographics  John [name], is a 75 y.o. male, DOB - [birth year], FUX:[id number]  Admit date - 02/06/2021   Admitting Physician [name], MD  Outpatient Primary MD for the patient is [name], MD  Outpatient specialists:   LOS - 3  days   Medical records reviewed and are as summarized below:    No chief complaint on file.      Brief summary   Patient is a 75 year old male with history of systolic and diastolic CHF, DM 2, hypertension, Nash cirrhosis, CKD stage IIIb, heart block status post PPM, HLD, persistent atrial fibrillation off chronic anticoagulation due to GI bleed presented with melanotic black stools for episodes on the morning of admission, lower extremity edema, shortness of breath with orthopnea. Patient was noted to have hemoglobin of 6.4, Hemoccult positive.  Patient was admitted for further work-up. Recently hospitalized 11/3-11/6 for GI bleed and was diagnosed with diverticulitis and duodenal ulcer  Assessment & Plan    Principal Problem:   Acute upper GI bleeding -Presented with hemoglobin of 6.4, was transfused 2 unit packed RBCs -Recent EGD showed healing duodenal ulcer, diverticulitis on last admission.  Status post embolization in 09/2020, surveillance EGD 01/27/2021 with a healed ulcer and portal gastropathy.  History of diverticulitis while inpatient 01/26/2021 -GI consulted, underwent capsule endoscopy on 11/15 - Capsule endoscopy inconclusive as capsule did not reach cecum, old blood, coffee- ground material in the stomach. Few small clots in small bowel -Patient was started on octreotide infusion on 11/16 by GI, on IV Rocephin -Underwent endoscopy 11/17, showed no endoscopic evidence of bleeding, inflammation or varices in the entire esophagus, nonobstructive ring Schatzki's ring, small hiatal hernia, portal hypertensive  gastropathy in gastric fundus and body.  20 mm healed ulcer in the duodenal bulb. -Unclear cause of GI bleeding, possibly from portal hypertensive gastropathy or Dieulafoy lesion, recommended tagged RBC scan if further bleeding.  Outpatient colonoscopy. -H&H currently stable 8.8  Active Problems: Nonischemic cardiomyopathy, chronic systolic CHF -BP currently stable, continue Coreg.  -Cardiology was consulted, recommended hold diuretics and follow-up on daily basis in terms of fluid status.  Outpatient, patient is on weekly Zaroxolyn, diuretics 30 mg twice daily, aspirin, Aldactone 12.5 mg daily   Diabetes mellitus type 2, uncontrolled with hyperglycemia, CKD -CBG stable, continue to hold Trulicity, [place].  Continue sliding scale insulin   Recent Labs    02/08/21 1138 02/08/21 1648 02/08/21 2039 02/09/21 0745 02/09/21 0900 02/09/21 1148  GLUCAP 131* 190* 135* 80 84 70   -Hemoglobin A1c 9.2 on 12/31/2020  Chronic atrial fibrillation, history of TAVR 04/2020 -Rate currently controlled, has pacemaker -Not on anticoagulation due to #1  AKI on CKD (chronic kidney disease) stage 3b, GFR 30-59 ml/min (HCC) -Baseline creatinine 1.8-2.0 -Creatinine slightly trending up although still close to baseline 2.16    Hyponatremia -Likely due to hypovolemia      Severe aortic stenosis, S/P TAVR (transcatheter aortic valve replacement) in 04/2020 -No acute issues  Diverticulitis -Last  admission on 11/4, was treated with Rocephin and Flagyl, then switched to oral antibiotics, was completed 11/11   Code Status: Full code DVT Prophylaxis:  Place and maintain sequential compression device Start: 02/06/21 1517 TED hose Start: 02/06/21 1435 SCDs Start: 02/06/21 1435   Level of Care: Level of care: Stepdown Family Communication: Discussed all imaging results, lab results, explained to the patient.  Discussed with patient's wife on 11/16   Disposition Plan:     Status is: Inpatient  Remains  inpatient appropriate because: EGD today  Time Spent in minutes   25 minutes   Procedures:  Capsule endoscopy Endoscopy  Consultants:   Cardiology  Antimicrobials:   Anti-infectives (From admission, onward)    Start     Dose/Rate Route Frequency Ordered Stop   02/06/21 1615  cefTRIAXone (ROCEPHIN) 2 g in sodium chloride 0.9 % 100 mL IVPB        2 g 200 mL/hr over 30 Minutes Intravenous Every 24 hours 02/06/21 1614     02/06/21 1445  cefdinir (OMNICEF) capsule 300 mg  Status:  Discontinued        300 mg Oral Every 12 hours 02/06/21 1441 02/06/21 1513   02/06/21 1445  metroNIDAZOLE (FLAGYL) tablet 500 mg  Status:  Discontinued        500 mg Oral Every 12 hours 02/06/21 1441 02/06/21 1513          Medications  Scheduled Meds:  sodium chloride   Intravenous Once   carvedilol  3.125 mg Oral BID WC   Chlorhexidine Gluconate Cloth  6 each Topical Daily   hydrocortisone cream  1 application Topical QID   insulin aspart  0-9 Units Subcutaneous TID WC   insulin glargine-yfgn  40 Units Subcutaneous QHS   linaclotide  145 mcg Oral QAC breakfast   pantoprazole (PROTONIX) IV  40 mg Intravenous Q12H   sodium chloride flush  3 mL Intravenous Q12H   sodium chloride flush  3 mL Intravenous Q12H   sucralfate  1 g Oral QID   Continuous Infusions:  sodium chloride     sodium chloride Stopped (02/08/21 0150)   cefTRIAXone (ROCEPHIN)  IV Stopped (02/08/21 1539)   norepinephrine (LEVOPHED) Adult infusion Stopped (02/08/21 0150)   octreotide  (SANDOSTATIN)    IV infusion 50 mcg/hr (02/09/21 1333)   PRN Meds:.sodium chloride, acetaminophen **OR** acetaminophen, bisacodyl, camphor-menthol, hydrALAZINE, HYDROmorphone (DILAUDID) injection, hydrOXYzine, ipratropium, levalbuterol, ondansetron **OR** ondansetron (ZOFRAN) IV, oxyCODONE, senna-docusate, sodium chloride flush, traZODone      Subjective:   John [name] was seen and examined today.  Feeling weak otherwise no acute  complaints.  Overnight remained stable.  No abdominal pain, nausea or vomiting. Objective:   Vitals:   02/09/21 1330 02/09/21 1400 02/09/21 1430 02/09/21 1500  BP: (!) 126/45 (!) 106/35 (!) 117/39 (!) 108/52  Pulse: 69 68 (!) 109 (!) 34  Resp: 16 15 (!) 22 18  Temp:      TempSrc:      SpO2: 98% 94% 91% 97%  Weight:      Height:        Intake/Output Summary (Last 24 hours) at 02/09/2021 1511 Last data filed at 02/09/2021 0935 Gross per 24 hour  Intake 376.46 ml  Output 425 ml  Net -48.54 ml     Wt Readings from Last 3 Encounters:  02/09/21 95.6 kg  02/01/21 99.5 kg  01/31/21 100.4 kg   Physical Exam General: Alert and oriented x 3, NAD Cardiovascular: S1 S2 clear, RRR. No pedal  edema b/l Respiratory: CTAB, no wheezing or rhonchi Gastrointestinal: Soft, nontender, nondistended, NBS Ext: no pedal edema bilaterally Psych: Normal affect and demeanor, alert and oriented x3   Data Reviewed:  I have personally reviewed following labs and imaging studies  Micro Results Recent Results (from the past 240 hour(s))  Resp Panel by RT-PCR (Flu A&B, Covid) Nasopharyngeal Swab     Status: None   Collection Time: 02/06/21  1:51 PM   Specimen: Nasopharyngeal Swab; Nasopharyngeal(NP) swabs in vial transport medium  Result Value Ref Range Status   SARS Coronavirus 2 by RT PCR NEGATIVE NEGATIVE Final    Comment: (NOTE) SARS-CoV-2 target nucleic acids are NOT DETECTED.  The SARS-CoV-2 RNA is generally detectable in upper respiratory specimens during the acute phase of infection. The lowest concentration of SARS-CoV-2 viral copies this assay can detect is 138 copies/mL. A negative result does not preclude SARS-Cov-2 infection and should not be used as the sole basis for treatment or other patient management decisions. A negative result may occur with  improper specimen collection/handling, submission of specimen other than nasopharyngeal swab, presence of viral mutation(s) within  the areas targeted by this assay, and inadequate number of viral copies(<138 copies/mL). A negative result must be combined with clinical observations, patient history, and epidemiological information. The expected result is Negative.  Fact Sheet for Patients:  EntrepreneurPulse.com.au  Fact Sheet for Healthcare Providers:  IncredibleEmployment.be  This test is no t yet approved or cleared by the [place] FDA and  has been authorized for detection and/or diagnosis of SARS-CoV-2 by FDA under an Emergency Use Authorization (EUA). This EUA will remain  in effect (meaning this test can be used) for the duration of the COVID-19 declaration under Section 564(b)(1) of the Act, 21 U.S.C.section 360bbb-3(b)(1), unless the authorization is terminated  or revoked sooner.       Influenza A by PCR NEGATIVE NEGATIVE Final   Influenza B by PCR NEGATIVE NEGATIVE Final    Comment: (NOTE) The Xpert Xpress SARS-CoV-2/FLU/RSV plus assay is intended as an aid in the diagnosis of influenza from Nasopharyngeal swab specimens and should not be used as a sole basis for treatment. Nasal washings and aspirates are unacceptable for Xpert Xpress SARS-CoV-2/FLU/RSV testing.  Fact Sheet for Patients: EntrepreneurPulse.com.au  Fact Sheet for Healthcare Providers: IncredibleEmployment.be  This test is not yet approved or cleared by the [place] FDA and has been authorized for detection and/or diagnosis of SARS-CoV-2 by FDA under an Emergency Use Authorization (EUA). This EUA will remain in effect (meaning this test can be used) for the duration of the COVID-19 declaration under Section 564(b)(1) of the Act, 21 U.S.C. section 360bbb-3(b)(1), unless the authorization is terminated or revoked.  Performed at [facility], [address], [place], [place] [address]   MRSA Next Gen by PCR, Nasal     Status: None   Collection  Time: 02/06/21  8:20 PM   Specimen: Nasal Mucosa; Nasal Swab  Result Value Ref Range Status   MRSA by PCR Next Gen NOT DETECTED NOT DETECTED Final    Comment: (NOTE) The GeneXpert MRSA Assay (FDA approved for NASAL specimens only), is one component of a comprehensive MRSA colonization surveillance program. It is not intended to diagnose MRSA infection nor to guide or monitor treatment for MRSA infections. Test performance is not FDA approved in patients less than 37 years old. Performed at [facility], [address] [place], [place] [address]     Radiology Reports [place]  Result Date: 01/26/2021 CLINICAL DATA:  Acute abdominal pain in the LEFT lower quadrant with nausea and vomiting that began this morning in a 75 year old male EXAM: CT ABDOMEN AND PELVIS WITHOUT CONTRAST TECHNIQUE: Multidetector CT imaging of the abdomen and pelvis was performed following the standard protocol without IV contrast. COMPARISON:  April 30, 2020. FINDINGS: Lower chest: Signs of trans arterial aortic valve replacement seen on scout images. Pacer device in place, incompletely evaluated, lead in the RIGHT heart as before. Small RIGHT-sided pleural effusion is diminished from more remote imaging. Mild septal thickening the lung bases without dense consolidative process. Lung base assessment mildly limited due to respiratory motion. Hepatobiliary: Nodular hepatic contours compatible with cirrhotic morphologic changes. Cholelithiasis, contracted gallbladder about gallstones. No gross lesion on noncontrast imaging. Pancreas: No stranding about the pancreas to the extent evaluated due to streak artifact from gastroduodenal artery coiling. Pancreatic atrophy as before. Spleen: Normal. Adrenals/Urinary Tract: Adrenal glands are normal. Smooth contour the bilateral kidneys. Chronic perinephric stranding is unchanged. Venous collateral pathways about the RIGHT urinary bladder similarly unchanged. No  substantial perivesical stranding, nephrolithiasis, ureteral calculi or hydronephrosis. Stomach/Bowel: Sigmoid diverticular disease with question of mild stranding near the descending/sigmoid junction. No abscess or free air. Normal appendix. Stomach is distended without signs of adjacent stranding. Small bowel is nondilated. Ovoid low-density structure adjacent to the third portion of the duodenum measures 4.4 x 3.2 cm previously 5.1 x 3.6 cm and December of 2021 and not substantially changed compared to imaging from February of 2022. Vascular/Lymphatic: Aortic atherosclerosis. No sign of aneurysm. Smooth contour of the IVC. There is no gastrohepatic or hepatoduodenal ligament lymphadenopathy. No retroperitoneal or mesenteric lymphadenopathy. No pelvic sidewall lymphadenopathy. Limited assessment of vascular structures without intravenous contrast on today's study. Reproductive: Unremarkable aside from adjacent collateral vessels near the urinary bladder and prostate gland, not well assessed given lack of contrast. Other: Moderate LEFT small RIGHT inguinal hernias containing fat. Musculoskeletal: No acute bone finding. No destructive bone process. Spinal degenerative changes. Degenerative changes are marked and worse at the L3-4 level. IMPRESSION: Suspect mild acute uncomplicated sigmoid diverticulitis at the descending/sigmoid junction. Pancolonic diverticulosis. Hepatic cirrhosis. Small RIGHT-sided pleural effusion is diminished from more remote imaging. 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 Cholelithiasis, contracted gallbladder about gallstones. Aortic Atherosclerosis (ICD10-I70.0). Electronically Signed   By: Zetta Bills M.D.   On: 01/26/2021 17:18   CUP PACEART REMOTE DEVICE CHECK  Result Date: 01/15/2021 Scheduled remote reviewed. Normal device function.  AF burden 100%. Ave V  response 60's-70's bpm. No OAC due to bleed. Next remote 91 days. LH   Lab Data:  CBC: Recent Labs  Lab 02/06/21 1300 02/07/21 0957 02/08/21 0014 02/08/21 0430 02/08/21 1407 02/09/21 0433  WBC 8.1 11.7*  --  8.5  --  8.0  NEUTROABS 5.7  --   --   --   --   --   HGB 6.4* 8.4* 6.7* 6.7* 8.4* 8.8*  HCT 21.6* 25.1* 19.8* 20.8* 25.2* 27.5*  MCV 87.1 82.6  --  84.6  --  85.9  PLT 176 189  --  139*  --  409*   Basic Metabolic Panel: Recent Labs  Lab 02/06/21 1300 02/07/21 0957 02/08/21 0430 02/08/21 1407 02/09/21 0433  NA 126* 133* 133*  --  134*  K 5.3* 3.6 3.3*  --  3.9  CL 97* 100 100  --  102  CO2  21* 23 25  --  23  GLUCOSE 276* 167* 89  --  89  BUN 98* 111* 108*  --  102*  CREATININE 2.24* 1.99* 2.14*  --  2.16*  CALCIUM 7.9* 8.8* 8.3*  --  8.5*  MG  --   --   --  2.1  --    GFR: Estimated Creatinine Clearance: 35.4 mL/min (A) (by C-G formula based on SCr of 2.16 mg/dL (H)). Liver Function Tests: Recent Labs  Lab 02/06/21 1300 02/08/21 1407  AST 27 31  ALT 16 15  ALKPHOS 176* 121  BILITOT 2.3* 2.1*  PROT 6.1* 5.9*  ALBUMIN 2.6* 3.0*   No results for input(s): LIPASE, AMYLASE in the last 168 hours. No results for input(s): AMMONIA in the last 168 hours. Coagulation Profile: Recent Labs  Lab 02/06/21 1300 02/07/21 0957 02/08/21 1407  INR 1.7* 1.5* 1.4*   Cardiac Enzymes: No results for input(s): CKTOTAL, CKMB, CKMBINDEX, TROPONINI in the last 168 hours. BNP (last 3 results) No results for input(s): PROBNP in the last 8760 hours. HbA1C: No results for input(s): HGBA1C in the last 72 hours. CBG: Recent Labs  Lab 02/08/21 1648 02/08/21 2039 02/09/21 0745 02/09/21 0900 02/09/21 1148  GLUCAP 190* 135* 80 84 70   Lipid Profile: No results for input(s): CHOL, HDL, LDLCALC, TRIG, CHOLHDL, LDLDIRECT in the last 72 hours. Thyroid Function Tests: No results for input(s): TSH, T4TOTAL, FREET4, T3FREE, THYROIDAB in the last 72 hours. Anemia Panel: No  results for input(s): VITAMINB12, FOLATE, FERRITIN, TIBC, IRON, RETICCTPCT in the last 72 hours. Urine analysis:    Component Value Date/Time   COLORURINE STRAW (A) 01/26/2021 1328   APPEARANCEUR CLEAR 01/26/2021 1328   LABSPEC 1.008 01/26/2021 1328   PHURINE 6.0 01/26/2021 1328   GLUCOSEU NEGATIVE 01/26/2021 1328   HGBUR NEGATIVE 01/26/2021 1328   BILIRUBINUR NEGATIVE 01/26/2021 1328   KETONESUR NEGATIVE 01/26/2021 1328   PROTEINUR NEGATIVE 01/26/2021 1328   UROBILINOGEN 0.2 01/05/2014 0744   NITRITE NEGATIVE 01/26/2021 1328   LEUKOCYTESUR NEGATIVE 01/26/2021 1328     Ellyana Crigler M.D. [facility] 02/09/2021, 3:11 PM  Available via Epic secure chat 7am-7pm After 7 pm, please refer to night coverage provider listed on amion.

## 2021-02-10 ENCOUNTER — Inpatient Hospital Stay (HOSPITAL_COMMUNITY): Payer: Medicare HMO

## 2021-02-10 DIAGNOSIS — K922 Gastrointestinal hemorrhage, unspecified: Secondary | ICD-10-CM | POA: Diagnosis not present

## 2021-02-10 DIAGNOSIS — I4821 Permanent atrial fibrillation: Secondary | ICD-10-CM

## 2021-02-10 LAB — CBC
HCT: 28.9 % — ABNORMAL LOW (ref 39.0–52.0)
Hemoglobin: 9 g/dL — ABNORMAL LOW (ref 13.0–17.0)
MCH: 28.2 pg (ref 26.0–34.0)
MCHC: 31.1 g/dL (ref 30.0–36.0)
MCV: 90.6 fL (ref 80.0–100.0)
Platelets: 134 10*3/uL — ABNORMAL LOW (ref 150–400)
RBC: 3.19 MIL/uL — ABNORMAL LOW (ref 4.22–5.81)
RDW: 22.3 % — ABNORMAL HIGH (ref 11.5–15.5)
WBC: 6.1 10*3/uL (ref 4.0–10.5)
nRBC: 0.3 % — ABNORMAL HIGH (ref 0.0–0.2)

## 2021-02-10 LAB — BASIC METABOLIC PANEL
Anion gap: 9 (ref 5–15)
BUN: 83 mg/dL — ABNORMAL HIGH (ref 8–23)
CO2: 24 mmol/L (ref 22–32)
Calcium: 8.3 mg/dL — ABNORMAL LOW (ref 8.9–10.3)
Chloride: 104 mmol/L (ref 98–111)
Creatinine, Ser: 1.84 mg/dL — ABNORMAL HIGH (ref 0.61–1.24)
GFR, Estimated: 38 mL/min — ABNORMAL LOW (ref >60)
Glucose, Bld: 67 mg/dL — ABNORMAL LOW (ref 70–99)
Potassium: 3.5 mmol/L (ref 3.5–5.1)
Sodium: 137 mmol/L (ref 135–145)

## 2021-02-10 LAB — GLUCOSE, CAPILLARY
Glucose-Capillary: 102 mg/dL — ABNORMAL HIGH (ref 70–99)
Glucose-Capillary: 116 mg/dL — ABNORMAL HIGH (ref 70–99)
Glucose-Capillary: 144 mg/dL — ABNORMAL HIGH (ref 70–99)
Glucose-Capillary: 156 mg/dL — ABNORMAL HIGH (ref 70–99)

## 2021-02-10 MED ORDER — GUAIFENESIN-DM 100-10 MG/5ML PO SYRP
5.0000 mL | ORAL_SOLUTION | ORAL | Status: DC | PRN
  Administered 2021-02-10 – 2021-02-12 (×2): 5 mL via ORAL
  Filled 2021-02-10 (×2): qty 5

## 2021-02-10 MED ORDER — SODIUM CHLORIDE 0.9 % IV SOLN
25.0000 ug/h | INTRAVENOUS | Status: AC
  Administered 2021-02-10: 25 ug/h via INTRAVENOUS
  Filled 2021-02-10: qty 1

## 2021-02-10 NOTE — Progress Notes (Signed)
[facility]                                                                              Patient Demographics  John [name], is a 75 y.o. male, DOB - May 18, 1945, JJK:[id number]  Admit date - 02/06/2021   Admitting Physician [name], MD  Outpatient Primary MD for the patient is [name], MD  Outpatient specialists:   LOS - 4  days   Medical records reviewed and are as summarized below:   cc Fatigue      Brief summary   Patient is a 75 year old male with history of systolic and diastolic CHF, DM 2, hypertension, Nash cirrhosis, CKD stage IIIb, heart block status post PPM, HLD, persistent atrial fibrillation off chronic anticoagulation due to GI bleed presented with melanotic black stools for episodes on the morning of admission, lower extremity edema, shortness of breath with orthopnea. Patient was noted to have hemoglobin of 6.4, Hemoccult positive.  Patient was admitted for further work-up. Recently hospitalized 11/3-11/6 for GI bleed and was diagnosed with diverticulitis and duodenal ulcer  Assessment & Plan    Principal Problem:   Acute upper GI bleeding -Presented with hemoglobin of 6.4, was transfused 2 unit packed RBCs -Recent EGD showed healing duodenal ulcer, diverticulitis on last admission.  Status post embolization in 09/2020, surveillance EGD 01/27/2021 with a healed ulcer and portal gastropathy.  History of diverticulitis while inpatient 01/26/2021 -GI consulted, underwent capsule endoscopy on 11/15 - Capsule endoscopy inconclusive as capsule did not reach cecum, old blood, coffee- ground material in the stomach. Few small clots in small bowel -Underwent upper endoscopy 11/17, showed no endoscopic evidence of bleeding, inflammation or varices in the entire esophagus, nonobstructive ring Schatzki's ring, small hiatal hernia, portal hypertensive gastropathy in gastric fundus and body.  20 mm healed ulcer in the duodenal bulb. -Unclear  cause of GI bleeding, possibly from portal hypertensive gastropathy or Dieulafoy lesion, recommended tagged RBC scan if further bleeding ( CTA abdomen and pelvis would not be possible given chronic kidney disease) -Outpatient colonoscopy advised in the near future -Octreotide infusion to be tapered off on 02/10/2021 -H&H currently stable > 8  -No further BMs or acute bleeding concerns  Active Problems: Nonischemic cardiomyopathy, chronic systolic CHF -BP currently stable, continue Coreg.  -Cardiology was consulted, recommended hold diuretics and follow-up on daily basis in terms of fluid status.  Outpatient, patient is on weekly Zaroxolyn, diuretics 30 mg twice daily, aspirin, Aldactone 12.5 mg daily -Okay to continue Coreg 3.25 mg twice daily -Discussed with Dr. Domenic Polite from cardiology service recommends slowly restarting cardiac meds including diuretics on 02/11/2021 if patient remains hemodynamically stable  Diabetes mellitus type 2, uncontrolled with hyperglycemia, CKD -CBG stable, continue to hold Trulicity, [place]. -Continue Lantus 40 units nightly Use Novolog/Humalog Sliding scale insulin with Accu-Cheks/Fingersticks as ordered   Recent Labs    02/09/21 1612 02/09/21 1746 02/09/21 2107 02/10/21 0736 02/10/21 1115 02/10/21 1617  GLUCAP 60* 94 160* 102* 116* 144*   -Hemoglobin A1c 9.2 on 12/31/2020  Chronic atrial fibrillation, history of TAVR 04/2020 -Continue Coreg 3.25 mg twice daily for rate control, has pacemaker -Not  on anticoagulation due to GI bleed  AKI on CKD (chronic kidney disease) stage 3b, GFR 30-59 ml/min (HCC) -Baseline creatinine 1.8-2.0 -Creatinine slightly trending up although still close to baseline 2.16    Hyponatremia -Likely due to hypovolemia -Resolved with hydration      Severe Aortic stenosis, S/P TAVR (transcatheter aortic valve replacement) in 04/2020 -No acute issues  Diverticulitis -Last admission on 01/27/21, was treated with Rocephin  and Flagyl, then switched to oral antibiotics, was completed 11/11  SBP prophylaxis--IV Rocephin for SBP prophylaxis for total 5 days   Code Status: Full code DVT Prophylaxis:  Place and maintain sequential compression device Start: 02/06/21 1517 TED hose Start: 02/06/21 1435 SCDs Start: 02/06/21 1435   Level of Care: Level of care: Stepdown Family Communication: Discussed all imaging results, lab results, explained to the patient.  Discussed with patient's wife on 11/16   Disposition Plan:   Per GI and cardiology teams possible discharge home in 24 to 48 hours if no further bleeding and if hemodynamically stable after restarting diuretic/cardiac meds    Status is: Inpatient  Remains inpatient appropriate   Time Spent in minutes   25 minutes   Procedures:  Capsule endoscopy Endoscopy  Consultants:   Cardiology  Antimicrobials:   Anti-infectives (From admission, onward)    Start     Dose/Rate Route Frequency Ordered Stop   02/06/21 1615  cefTRIAXone (ROCEPHIN) 2 g in sodium chloride 0.9 % 100 mL IVPB        2 g 200 mL/hr over 30 Minutes Intravenous Every 24 hours 02/06/21 1614     02/06/21 1445  cefdinir (OMNICEF) capsule 300 mg  Status:  Discontinued        300 mg Oral Every 12 hours 02/06/21 1441 02/06/21 1513   02/06/21 1445  metroNIDAZOLE (FLAGYL) tablet 500 mg  Status:  Discontinued        500 mg Oral Every 12 hours 02/06/21 1441 02/06/21 1513          Medications  Scheduled Meds:  sodium chloride   Intravenous Once   carvedilol  3.125 mg Oral BID WC   Chlorhexidine Gluconate Cloth  6 each Topical Daily   hydrocortisone cream  1 application Topical QID   insulin aspart  0-9 Units Subcutaneous TID WC   insulin glargine-yfgn  40 Units Subcutaneous QHS   linaclotide  145 mcg Oral QAC breakfast   pantoprazole (PROTONIX) IV  40 mg Intravenous Q12H   sodium chloride flush  3 mL Intravenous Q12H   sodium chloride flush  3 mL Intravenous Q12H   sucralfate  1  g Oral QID   Continuous Infusions:  sodium chloride     sodium chloride Stopped (02/08/21 0150)   cefTRIAXone (ROCEPHIN)  IV 2 g (02/10/21 1640)   norepinephrine (LEVOPHED) Adult infusion Stopped (02/08/21 0150)   PRN Meds:.sodium chloride, acetaminophen **OR** acetaminophen, bisacodyl, camphor-menthol, guaiFENesin-dextromethorphan, hydrALAZINE, HYDROmorphone (DILAUDID) injection, hydrOXYzine, ipratropium, levalbuterol, ondansetron **OR** ondansetron (ZOFRAN) IV, oxyCODONE, senna-docusate, sodium chloride flush, traZODone      Subjective:   John [name] was seen and examined today.  No BM, wife at bedside, questions answered, no abdominal pain -Happy to have his diet advanced today Objective:   Vitals:   02/10/21 1600 02/10/21 1637 02/10/21 1700 02/10/21 1800  BP: (!) 148/57  (!) 125/49 (!) 123/39  Pulse: (!) 43 63 63 (!) 43  Resp: 15 18 15 20   Temp:  98.5 F (36.9 C)    TempSrc:  Axillary    SpO2:  98% 100% 100% 100%  Weight:      Height:        Intake/Output Summary (Last 24 hours) at 02/10/2021 1846 Last data filed at 02/10/2021 1739 Gross per 24 hour  Intake 1088.33 ml  Output 1750 ml  Net -661.67 ml     Wt Readings from Last 3 Encounters:  02/10/21 95.7 kg  02/01/21 99.5 kg  01/31/21 100.4 kg   Physical Exam General: Alert and oriented x 3, NAD Cardiovascular: S1 S2 clear, RRR. No pedal edema b/l, AICD in situ Respiratory: CTAB, no wheezing or rhonchi Gastrointestinal: Soft, nontender, nondistended, NBS Ext: no pedal edema bilaterally Psych: Normal affect and demeanor, alert and oriented x3   Data Reviewed:  I have personally reviewed following labs and imaging studies  Micro Results Recent Results (from the past 240 hour(s))  Resp Panel by RT-PCR (Flu A&B, Covid) Nasopharyngeal Swab     Status: None   Collection Time: 02/06/21  1:51 PM   Specimen: Nasopharyngeal Swab; Nasopharyngeal(NP) swabs in vial transport medium  Result Value Ref Range Status    SARS Coronavirus 2 by RT PCR NEGATIVE NEGATIVE Final    Comment: (NOTE) SARS-CoV-2 target nucleic acids are NOT DETECTED.  The SARS-CoV-2 RNA is generally detectable in upper respiratory specimens during the acute phase of infection. The lowest concentration of SARS-CoV-2 viral copies this assay can detect is 138 copies/mL. A negative result does not preclude SARS-Cov-2 infection and should not be used as the sole basis for treatment or other patient management decisions. A negative result may occur with  improper specimen collection/handling, submission of specimen other than nasopharyngeal swab, presence of viral mutation(s) within the areas targeted by this assay, and inadequate number of viral copies(<138 copies/mL). A negative result must be combined with clinical observations, patient history, and epidemiological information. The expected result is Negative.  Fact Sheet for Patients:  EntrepreneurPulse.com.au  Fact Sheet for Healthcare Providers:  IncredibleEmployment.be  This test is no t yet approved or cleared by the [place] FDA and  has been authorized for detection and/or diagnosis of SARS-CoV-2 by FDA under an Emergency Use Authorization (EUA). This EUA will remain  in effect (meaning this test can be used) for the duration of the COVID-19 declaration under Section 564(b)(1) of the Act, 21 U.S.C.section 360bbb-3(b)(1), unless the authorization is terminated  or revoked sooner.       Influenza A by PCR NEGATIVE NEGATIVE Final   Influenza B by PCR NEGATIVE NEGATIVE Final    Comment: (NOTE) The Xpert Xpress SARS-CoV-2/FLU/RSV plus assay is intended as an aid in the diagnosis of influenza from Nasopharyngeal swab specimens and should not be used as a sole basis for treatment. Nasal washings and aspirates are unacceptable for Xpert Xpress SARS-CoV-2/FLU/RSV testing.  Fact Sheet for  Patients: EntrepreneurPulse.com.au  Fact Sheet for Healthcare Providers: IncredibleEmployment.be  This test is not yet approved or cleared by the [place] FDA and has been authorized for detection and/or diagnosis of SARS-CoV-2 by FDA under an Emergency Use Authorization (EUA). This EUA will remain in effect (meaning this test can be used) for the duration of the COVID-19 declaration under Section 564(b)(1) of the Act, 21 U.S.C. section 360bbb-3(b)(1), unless the authorization is terminated or revoked.  Performed at [facility], [address] [place], [place] [address]   MRSA Next Gen by PCR, Nasal     Status: None   Collection Time: 02/06/21  8:20 PM   Specimen: Nasal Mucosa; Nasal Swab  Result Value Ref  Range Status   MRSA by PCR Next Gen NOT DETECTED NOT DETECTED Final    Comment: (NOTE) The GeneXpert MRSA Assay (FDA approved for NASAL specimens only), is one component of a comprehensive MRSA colonization surveillance program. It is not intended to diagnose MRSA infection nor to guide or monitor treatment for MRSA infections. Test performance is not FDA approved in patients less than 24 years old. Performed at [facility], [address], [place], [place] [address]     Radiology Reports CT ABDOMEN PELVIS WO CONTRAST  Result Date: 01/26/2021 CLINICAL DATA:  Acute abdominal pain in the LEFT lower quadrant with nausea and vomiting that began this morning in a 75 year old male EXAM: CT ABDOMEN AND PELVIS WITHOUT CONTRAST TECHNIQUE: Multidetector CT imaging of the abdomen and pelvis was performed following the standard protocol without IV contrast. COMPARISON:  April 30, 2020. FINDINGS: Lower chest: Signs of trans arterial aortic valve replacement seen on scout images. Pacer device in place, incompletely evaluated, lead in the RIGHT heart as before. Small RIGHT-sided pleural effusion is diminished from more remote imaging. Mild septal  thickening the lung bases without dense consolidative process. Lung base assessment mildly limited due to respiratory motion. Hepatobiliary: Nodular hepatic contours compatible with cirrhotic morphologic changes. Cholelithiasis, contracted gallbladder about gallstones. No gross lesion on noncontrast imaging. Pancreas: No stranding about the pancreas to the extent evaluated due to streak artifact from gastroduodenal artery coiling. Pancreatic atrophy as before. Spleen: Normal. Adrenals/Urinary Tract: Adrenal glands are normal. Smooth contour the bilateral kidneys. Chronic perinephric stranding is unchanged. Venous collateral pathways about the RIGHT urinary bladder similarly unchanged. No substantial perivesical stranding, nephrolithiasis, ureteral calculi or hydronephrosis. Stomach/Bowel: Sigmoid diverticular disease with question of mild stranding near the descending/sigmoid junction. No abscess or free air. Normal appendix. Stomach is distended without signs of adjacent stranding. Small bowel is nondilated. Ovoid low-density structure adjacent to the third portion of the duodenum measures 4.4 x 3.2 cm previously 5.1 x 3.6 cm and December of 2021 and not substantially changed compared to imaging from February of 2022. Vascular/Lymphatic: Aortic atherosclerosis. No sign of aneurysm. Smooth contour of the IVC. There is no gastrohepatic or hepatoduodenal ligament lymphadenopathy. No retroperitoneal or mesenteric lymphadenopathy. No pelvic sidewall lymphadenopathy. Limited assessment of vascular structures without intravenous contrast on today's study. Reproductive: Unremarkable aside from adjacent collateral vessels near the urinary bladder and prostate gland, not well assessed given lack of contrast. Other: Moderate LEFT small RIGHT inguinal hernias containing fat. Musculoskeletal: No acute bone finding. No destructive bone process. Spinal degenerative changes. Degenerative changes are marked and worse at the L3-4  level. IMPRESSION: Suspect mild acute uncomplicated sigmoid diverticulitis at the descending/sigmoid junction. Pancolonic diverticulosis. Hepatic cirrhosis. Small RIGHT-sided pleural effusion is diminished from more remote imaging. 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 Cholelithiasis, contracted gallbladder about gallstones. Aortic Atherosclerosis (ICD10-I70.0). Electronically Signed   By: Zetta Bills M.D.   On: 01/26/2021 17:18   DG Abd 1 View  Result Date: 02/10/2021 CLINICAL DATA:  Video capsule location EXAM: ABDOMEN - 1 VIEW COMPARISON:  CT abdomen pelvis 01/26/2021 FINDINGS: Normal bowel gas pattern. No obstruction or ileus. No retained video capsule identified in the abdomen. Embolization coils overlying the duodenum. Dual lead pacemaker.  TAVR. IMPRESSION: Normal bowel gas pattern. No video capsule device identified in the abdomen Electronically Signed   By: Franchot Gallo M.D.   On: 02/10/2021 15:13   CUP  PACEART REMOTE DEVICE CHECK  Result Date: 01/15/2021 Scheduled remote reviewed. Normal device function.  AF burden 100%. Ave V response 60's-70's bpm. No OAC due to bleed. Next remote 91 days. LH  US LIVER DOPPLER  Result Date: 02/09/2021 CLINICAL DATA:  Cirrhosis.  Assess portal vein patency. EXAM: DUPLEX ULTRASOUND OF LIVER TECHNIQUE: Color and duplex Doppler ultrasound was performed to evaluate the hepatic in-flow and out-flow vessels. COMPARISON:  RIGHT quadrant ultrasound 09/16/2020. Ultrasound elastography, 09/16/2020. CT AP, 01/26/2021. FINDINGS: Liver: Increased hepatic parenchymal echogenicity. Grossly nodular liver contour. No focal lesion, mass or intrahepatic biliary ductal dilatation. Main Portal Vein size: 0.8 cm Portal Vein Velocities Main Prox:  19 cm/sec Main Mid: 16 cm/sec Main Dist:  19 cm/sec Right: 26 cm/sec Left: 18 cm/sec Hepatic Vein Velocities  Right:  55 cm/sec Middle:  49 cm/sec Left:  72 cm/sec IVC: Present and patent with normal respiratory phasicity. Hepatic Artery Velocity:  54 cm/sec Splenic Vein Velocity:  35 cm/sec Spleen: 5.4 x 4 7 x 12.4 cm with a total volume of 163 cm^3 (411 cm^3 is upper limit normal) Portal Vein Occlusion/Thrombus: No Splenic Vein Occlusion/Thrombus: No Ascites: None Varices: None IMPRESSION: 1. Cirrhotic morphology liver.  No sonographic evidence of hepatoma. 2. Patent portal and splenic veins. Michaelle Birks, MD [facility] Radiology Electronically Signed   By: Michaelle Birks M.D.   On: 02/09/2021 17:32    Lab Data:  CBC: Recent Labs  Lab 02/06/21 1300 02/07/21 0957 02/08/21 0014 02/08/21 0430 02/08/21 1407 02/09/21 0433 02/10/21 0411  WBC 8.1 11.7*  --  8.5  --  8.0 6.1  NEUTROABS 5.7  --   --   --   --   --   --   HGB 6.4* 8.4* 6.7* 6.7* 8.4* 8.8* 9.0*  HCT 21.6* 25.1* 19.8* 20.8* 25.2* 27.5* 28.9*  MCV 87.1 82.6  --  84.6  --  85.9 90.6  PLT 176 189  --  139*  --  144* 710*   Basic Metabolic Panel: Recent Labs  Lab 02/06/21 1300 02/07/21 0957 02/08/21 0430 02/08/21 1407 02/09/21 0433 02/10/21 0411  NA 126* 133* 133*  --  134* 137  K 5.3* 3.6 3.3*  --  3.9 3.5  CL 97* 100 100  --  102 104  CO2 21* 23 25  --  23 24  GLUCOSE 276* 167* 89  --  89 67*  BUN 98* 111* 108*  --  102* 83*  CREATININE 2.24* 1.99* 2.14*  --  2.16* 1.84*  CALCIUM 7.9* 8.8* 8.3*  --  8.5* 8.3*  MG  --   --   --  2.1  --   --    GFR: Estimated Creatinine Clearance: 41.6 mL/min (A) (by C-G formula based on SCr of 1.84 mg/dL (H)). Liver Function Tests: Recent Labs  Lab 02/06/21 1300 02/08/21 1407  AST 27 31  ALT 16 15  ALKPHOS 176* 121  BILITOT 2.3* 2.1*  PROT 6.1* 5.9*  ALBUMIN 2.6* 3.0*   No results for input(s): LIPASE, AMYLASE in the last 168 hours. No results for input(s): AMMONIA in the last 168 hours. Coagulation Profile: Recent Labs  Lab  02/06/21 1300 02/07/21 0957 02/08/21 1407  INR 1.7* 1.5* 1.4*   Cardiac Enzymes: No results for input(s): CKTOTAL, CKMB, CKMBINDEX, TROPONINI in the last 168 hours. BNP (last 3 results) No results for input(s): PROBNP in the last 8760 hours. HbA1C: No results for input(s): HGBA1C in the last 72 hours.  CBG: Recent Labs  Lab 02/09/21 1746 02/09/21 2107 02/10/21 0736 02/10/21 1115 02/10/21 1617  GLUCAP 94 160* 102* 116* 144*   Lipid Profile: No results for input(s): CHOL, HDL, LDLCALC, TRIG, CHOLHDL, LDLDIRECT in the last 72 hours. Thyroid Function Tests: No results for input(s): TSH, T4TOTAL, FREET4, T3FREE, THYROIDAB in the last 72 hours. Anemia Panel: No results for input(s): VITAMINB12, FOLATE, FERRITIN, TIBC, IRON, RETICCTPCT in the last 72 hours. Urine analysis:    Component Value Date/Time   COLORURINE STRAW (A) 01/26/2021 1328   APPEARANCEUR CLEAR 01/26/2021 1328   LABSPEC 1.008 01/26/2021 1328   PHURINE 6.0 01/26/2021 1328   GLUCOSEU NEGATIVE 01/26/2021 1328   HGBUR NEGATIVE 01/26/2021 1328   BILIRUBINUR NEGATIVE 01/26/2021 1328   KETONESUR NEGATIVE 01/26/2021 1328   PROTEINUR NEGATIVE 01/26/2021 1328   UROBILINOGEN 0.2 01/05/2014 0744   NITRITE NEGATIVE 01/26/2021 [place] 01/26/2021 [place] M.D. [facility] 02/10/2021, 6:46 PM  Available via Epic secure chat 7am-7pm After 7 pm, please refer to night coverage provider listed on amion.

## 2021-02-10 NOTE — Progress Notes (Addendum)
Inpatient Diabetes Program Recommendations  AACE/ADA: New Consensus Statement on Inpatient Glycemic Control (2015)  Target Ranges:  Prepandial:   less than 140 mg/dL      Peak postprandial:   less than 180 mg/dL (1-2 hours)      Critically ill patients:  140 - 180 mg/dL    Latest Reference Range & Units 02/09/21 07:45 02/09/21 09:00 02/09/21 11:48 02/09/21 16:12 02/09/21 17:46 02/09/21 21:07  Glucose-Capillary 70 - 99 mg/dL 80 84 70 60 (L) 94 160 (H)  40 units Semglee @2155      Home DM Meds: Trulicity 1.5 mg Qweek        Tresiba 40 units QHS   Current Orders: Semglee 40 units QHS     Novolog Sensitive Correction Scale/ SSI (0-9 units) TID AC    MD- Note CBG only 80 yesterday AM.  Also had Hypoglycemia at 4pm yesterday.  Please consider reducing Semglee slightly to 35 units QHS     --Will follow patient during hospitalization--  Wyn Quaker RN, MSN, CDE Diabetes Coordinator Inpatient Glycemic Control Team Team Pager: [phone] (8a-5p)

## 2021-02-10 NOTE — Progress Notes (Signed)
Occupational Therapy Treatment Patient Details Name: JACOBO MONCRIEF MRN: [id number] DOB: [birth year] Today's Date: 02/10/2021   History of present illness JALEAL SCHLIEP is a 75 y.o. male with medical history significant of systolic and diastolic CHF, DM 2, HTN, NASH cirrhosis, CKD stage IIIb, heart block status post PPM, HLD, persistent A. fib, off chronic anticoagulation due to GI bleed, upper GI bleed-ulcer, diverticulitis  Presented today with chief complaint of abdominal, black stool, 4 episodes this morning.  Some shortness of breath, when laying down, edema bilateral lower extremity.  On Zaroxolyn q. Mondays and torsemide twice a day.   OT comments  Pt agreeable to OT treatment today. Pt demonstrates limited B UE shoulder use to ~90* due to previous injury for R shoulder and general weakness in L. Pt tolerated AA/ROM exercises to L UE seated in chair with this therapist assisting. Pt also completed x4 reps of sit to stand from chair using RW. Moderate assist needed progressing to min/mod A once pt increased carry over of trunk flexion when standing. Verbal and tactile cues needed throughout session. Pt will benefit from continued OT in the hospital and recommended venue below to increase strength, balance, and endurance for safe ADL's.      Recommendations for follow up therapy are one component of a multi-disciplinary discharge planning process, led by the attending physician.  Recommendations may be updated based on patient status, additional functional criteria and insurance authorization.    Follow Up Recommendations  Home health OT    Assistance Recommended at Discharge Intermittent Supervision/Assistance  Equipment Recommendations  None recommended by OT    Recommendations for Other Services      Precautions / Restrictions Precautions Precautions: Fall Restrictions Weight Bearing Restrictions: No       Mobility Bed Mobility Overal bed mobility: Needs Assistance Bed  Mobility: Supine to Sit     Supine to sit: Min assist Sit to supine: Min assist   General bed mobility comments: Demonstrated labored movement, required increased time, and verbal cues for hand placement.    Transfers Overall transfer level: Needs assistance Equipment used: Rolling walker (2 wheels) Transfers: Sit to/from Stand Sit to Stand: Min assist;Mod assist   Step pivot transfers: Min guard       General transfer comment: x4 reps of sit to stand with more moderate assist initially progressing to min mod with cuing for anterior leaning and "nose over toes" stance.     Balance Overall balance assessment: Needs assistance Sitting-balance support: No upper extremity supported;Feet supported Sitting balance-Leahy Scale: Good Sitting balance - Comments: fair/good seated at EOB   Standing balance support: Bilateral upper extremity supported;During functional activity;Reliant on assistive device for balance Standing balance-Leahy Scale: Fair Standing balance comment: fair/good using RW                           ADL either performed or assessed with clinical judgement                                                 Extremity/Trunk Assessment Upper Extremity Assessment Upper Extremity Assessment: RUE deficits/detail;LUE deficits/detail RUE Deficits / Details: Limited to ~90* shoulder flexion and abduction A/ROM and P/ROM from previous shoulder injury. Able to grip and use R UE functionally with RW. LUE Deficits / Details: Pt generally weak in L  UE demonstrated 2+/5 MMT for shoulder flexion and abduction.                              Cognition Arousal/Alertness: Awake/alert Behavior During Therapy: WFL for tasks assessed/performed Overall Cognitive Status: Within Functional Limits for tasks assessed                                            Exercises Exercises: General Upper Extremity General Exercises - Upper  Extremity Shoulder Flexion: AAROM;Left;10 reps;Seated (2 sets of 5 reps) Shoulder ABduction: AAROM;10 reps;Seated (2 sets of 5 reps; x10 protraction A/AROM) General Exercises - Lower Extremity Long Arc Quad: Seated;AROM;Strengthening;Both;10 reps Hip Flexion/Marching: Seated;AROM;Strengthening;Both;10 reps Toe Raises: Seated;AROM;Strengthening;Both;10 reps Heel Raises: Seated;AROM;Strengthening;Both;10 reps           Pertinent Vitals/ Pain       Pain Assessment: Faces Pain Score: 0-No pain                                                          Frequency  Min 2X/week        Progress Toward Goals  OT Goals(current goals can now be found in the care plan section)  Progress towards OT goals: Progressing toward goals  ADL Goals Pt Will Perform Grooming: standing;with modified independence;with adaptive equipment Pt Will Perform Lower Body Dressing: with min assist;sitting/lateral leans;with adaptive equipment Pt Will Transfer to Toilet: with modified independence;stand pivot transfer;ambulating Pt/caregiver will Perform Home Exercise Program: Increased ROM;Increased strength;Right Upper extremity;Left upper extremity;With Supervision  Plan Discharge plan remains appropriate                                    End of Session    OT Visit Diagnosis: Unsteadiness on feet (R26.81);Other abnormalities of gait and mobility (R26.89);Muscle weakness (generalized) (M62.81)                 Time: 4098-1191 OT Time Calculation (min): 20 min  Charges: OT General Charges $OT Visit: 1 Visit OT Treatments $Therapeutic Exercise: 8-22 mins  Ikeem Cleckler OT, MOT  Larey Seat 02/10/2021, 1:55 PM

## 2021-02-10 NOTE — Progress Notes (Signed)
Progress Note  Patient Name: John [name] Date of Encounter: 02/10/2021  Primary Cardiologist: [name], MD  Subjective   No shortness of breath at rest, no chest pain.  No abdominal pain.  Inpatient Medications    Scheduled Meds:  sodium chloride   Intravenous Once   carvedilol  3.125 mg Oral BID WC   Chlorhexidine Gluconate Cloth  6 each Topical Daily   hydrocortisone cream  1 application Topical QID   insulin aspart  0-9 Units Subcutaneous TID WC   insulin glargine-yfgn  40 Units Subcutaneous QHS   linaclotide  145 mcg Oral QAC breakfast   pantoprazole (PROTONIX) IV  40 mg Intravenous Q12H   sodium chloride flush  3 mL Intravenous Q12H   sodium chloride flush  3 mL Intravenous Q12H   sucralfate  1 g Oral QID   Continuous Infusions:  sodium chloride     sodium chloride Stopped (02/08/21 0150)   cefTRIAXone (ROCEPHIN)  IV 2 g (02/09/21 1531)   norepinephrine (LEVOPHED) Adult infusion Stopped (02/08/21 0150)   octreotide  (SANDOSTATIN)    IV infusion 50 mcg/hr (02/10/21 0735)   PRN Meds: sodium chloride, acetaminophen **OR** acetaminophen, bisacodyl, camphor-menthol, hydrALAZINE, HYDROmorphone (DILAUDID) injection, hydrOXYzine, ipratropium, levalbuterol, ondansetron **OR** ondansetron (ZOFRAN) IV, oxyCODONE, senna-docusate, sodium chloride flush, traZODone   Vital Signs    Vitals:   02/10/21 0600 02/10/21 0700 02/10/21 0756 02/10/21 0800  BP: (!) 117/36 (!) 126/52  (!) 113/49  Pulse: (!) 41 73  (!) 40  Resp: 16 13  (!) 28  Temp:   (!) 97.4 F (36.3 C)   TempSrc:   Oral   SpO2: 97% 100%  97%  Weight:      Height:        Intake/Output Summary (Last 24 hours) at 02/10/2021 0840 Last data filed at 02/10/2021 0735 Gross per 24 hour  Intake 1070.78 ml  Output 1550 ml  Net -479.22 ml   Filed Weights   02/08/21 0500 02/09/21 0500 02/10/21 0351  Weight: 93.7 kg 95.6 kg 95.7 kg    Telemetry    Ventricular paced rhythm with occasional PVCs and  bursts of NSVT.  Personally reviewed.  ECG    No ECG reviewed.  Physical Exam   GEN: No acute distress.   Neck: No JVD. Cardiac: RRR with ectopy, 2/6 systolic murmur. Respiratory: Nonlabored. Clear to auscultation bilaterally. GI: Soft, nontender, bowel sounds present. MS: No edema left leg; No deformity.  Labs    Chemistry Recent Labs  Lab 02/06/21 1300 02/07/21 0957 02/08/21 0430 02/08/21 1407 02/09/21 0433 02/10/21 0411  NA 126*   < > 133*  --  134* 137  K 5.3*   < > 3.3*  --  3.9 3.5  CL 97*   < > 100  --  102 104  CO2 21*   < > 25  --  23 24  GLUCOSE 276*   < > 89  --  89 67*  BUN 98*   < > 108*  --  102* 83*  CREATININE 2.24*   < > 2.14*  --  2.16* 1.84*  CALCIUM 7.9*   < > 8.3*  --  8.5* 8.3*  PROT 6.1*  --   --  5.9*  --   --   ALBUMIN 2.6*  --   --  3.0*  --   --   AST 27  --   --  31  --   --   ALT 16  --   --  15  --   --   ALKPHOS 176*  --   --  121  --   --   BILITOT 2.3*  --   --  2.1*  --   --   GFRNONAA 30*   < > 31*  --  31* 38*  ANIONGAP 8   < > 8  --  9 9   < > = values in this interval not displayed.     Hematology Recent Labs  Lab 02/08/21 0430 02/08/21 1407 02/09/21 0433 02/10/21 0411  WBC 8.5  --  8.0 6.1  RBC 2.46*  --  3.20* 3.19*  HGB 6.7* 8.4* 8.8* 9.0*  HCT 20.8* 25.2* 27.5* 28.9*  MCV 84.6  --  85.9 90.6  MCH 27.2  --  27.5 28.2  MCHC 32.2  --  32.0 31.1  RDW 21.4*  --  21.3* 22.3*  PLT 139*  --  144* 134*    Cardiac Enzymes Recent Labs  Lab 02/07/21 0954 02/07/21 1135  TROPONINIHS 74* 69*    BNP Recent Labs  Lab 02/06/21 1300  BNP 1,516.0*     Radiology    US LIVER DOPPLER  Result Date: 02/09/2021 CLINICAL DATA:  Cirrhosis.  Assess portal vein patency. EXAM: DUPLEX ULTRASOUND OF LIVER TECHNIQUE: Color and duplex Doppler ultrasound was performed to evaluate the hepatic in-flow and out-flow vessels. COMPARISON:  RIGHT quadrant ultrasound 09/16/2020. Ultrasound elastography, 09/16/2020. CT AP, 01/26/2021.  FINDINGS: Liver: Increased hepatic parenchymal echogenicity. Grossly nodular liver contour. No focal lesion, mass or intrahepatic biliary ductal dilatation. Main Portal Vein size: 0.8 cm Portal Vein Velocities Main Prox:  19 cm/sec Main Mid: 16 cm/sec Main Dist:  19 cm/sec Right: 26 cm/sec Left: 18 cm/sec Hepatic Vein Velocities Right:  55 cm/sec Middle:  49 cm/sec Left:  72 cm/sec IVC: Present and patent with normal respiratory phasicity. Hepatic Artery Velocity:  54 cm/sec Splenic Vein Velocity:  35 cm/sec Spleen: 5.4 x 4 7 x 12.4 cm with a total volume of 163 cm^3 (411 cm^3 is upper limit normal) Portal Vein Occlusion/Thrombus: No Splenic Vein Occlusion/Thrombus: No Ascites: None Varices: None IMPRESSION: 1. Cirrhotic morphology liver.  No sonographic evidence of hepatoma. 2. Patent portal and splenic veins. Michaelle Birks, MD [facility] Radiology Electronically Signed   By: Michaelle Birks M.D.   On: 02/09/2021 17:32    Cardiac Studies   Echocardiogram 01/01/2021:  1. Left ventricular ejection fraction, by estimation, is 30 to 35%. The  left ventricle has moderately decreased function. The left ventricle  demonstrates global hypokinesis. The left ventricular internal cavity size  was mildly dilated. Left ventricular  diastolic function could not be evaluated.   2. Right ventricular systolic function is low normal. The right  ventricular size is moderately enlarged. There is mildly elevated  pulmonary artery systolic pressure.   3. Left atrial size was severely dilated.   4. Right atrial size was severely dilated.   5. The mitral valve is normal in structure. Mild to moderate mitral valve  regurgitation.   6. Tricuspid valve regurgitation is moderate.   7. The aortic valve has been repaired/replaced. Aortic valve  regurgitation is not visualized. No aortic stenosis is present. There is a  29 mm Edwards Sapien prosthetic (TAVR) valve present in the  aortic  position. Aortic valve mean gradient measures 10.0   mmHg. Aortic valve Vmax measures 2.13 m/s.   8. The inferior vena cava is dilated in size with <50% respiratory  variability, suggesting right atrial pressure of 15 mmHg.   Assessment & Plan    1.  HFrEF with chronic systolic heart failure, nonischemic cardiomyopathy.  LVEF 30 to 35% and RV contraction low normal by most recent assessment in October.  Diuretics have been on hold in the setting of acute GI bleed.  Blood pressure stable.  Net output of 500 cc last 24 hours.  2.  Permanent atrial fibrillation. CHA2DS2-VASc score 5. He is no longer anticoagulated with history of recurrent GI bleeding.  3.  History of severe aortic stenosis status post TAVR in February of this year.  Prosthetic function stable by recent echocardiogram with mean gradient 10 mmHg.  4.  Angiographically normal coronary arteries by cardiac catheterization in September 2021.  5.  GI bleed, melanotic stools noted.  He is on octreotide, capsules study incomplete, did not reach cecum.  Old blood and coffee-ground material noted in the stomach with a few small free-floating clots in the small bowel.  EGD showed portal hypertensive gastropathy in the gastric fundus and body with healed ulcer in the duodenal bulb, inflammatory changes but no active bleeding.  Tagged RBC scan recommended if acute bleeding returns.  His hemoglobin has been stable following PRBC transfusion, 9.0 this morning.  6.  CKD stage III with acute kidney injury.  Creatinine has improved today, down to 1.84.  Continue low-dose Coreg.  Volume status still looks reasonable at this point in the absence of his regular diuretics.  Would continue to reassess daily, review weights and urine output.  As GI status further stabilizes, anticipate gradual resumption of Demadex with potassium supplement, followed by Aldactone and ultimately his weekly metolazone if needed.  Signed, Rozann Lesches, MD   02/10/2021, 8:40 AM

## 2021-02-10 NOTE — Patient Instructions (Signed)
Repeat all exercises 10-15 times, 1-2 times per day.  1) Shoulder Protraction    Begin with elbows by your side, slowly "punch" straight out in front of you.      2) Shoulder Flexion  Supine:     Standing:         Begin with arms at your side with thumbs pointed up, slowly raise both arms up and forward towards overhead.               3) Horizontal abduction/adduction  Supine:   Standing:           Begin with arms straight out in front of you, bring out to the side in at "T" shape. Keep arms straight entire time.                 4) Internal & External Rotation   Supine:     Standing:     Stand with elbows at the side and elbows bent 90 degrees. Move your forearms away from your body, then bring back inward toward the body.     5) Shoulder Abduction  Supine:     Standing:       Lying on your back begin with your arms flat on the table next to your side. Slowly move your arms out to the side so that they go overhead, in a jumping jack or snow angel movement.    6) X to V arms (cheerleader move):  Begin with arms straight down, crossed in front of body in an "X". Keeping arms crossed, lift arms straight up overhead. Then spread arms apart into a "V" shape.  Bring back together into x and lower down to starting position.    7) W arms:  Begin with elbows bent and even with shoulders, hands pointing to ceiling. Keeping elbows at shoulder level, 1-shrug shoulders up, 2-squeeze shoulder blades together, and 3-relax.

## 2021-02-10 NOTE — Progress Notes (Addendum)
Physical Therapy Treatment Patient Details Name: MORTON SIMSON MRN: [id number] DOB: 08-01-1945 Today's Date: 02/10/2021   History of Present Illness DENIM KALMBACH is a 75 y.o. male with medical history significant of systolic and diastolic CHF, DM 2, HTN, NASH cirrhosis, CKD stage IIIb, heart block status post PPM, HLD, persistent A. fib, off chronic anticoagulation due to GI bleed, upper GI bleed-ulcer, diverticulitis  Presented today with chief complaint of abdominal, black stool, 4 episodes this morning.  Some shortness of breath, when laying down, edema bilateral lower extremity.  On Zaroxolyn q. Mondays and torsemide twice a day.    PT Comments    Patient presented in bed awake, alert, and agreeable for therapy. Patient continues to require Min assist and increased time for bed mobility, demonstrated slow labored movement d/t generalized weakness. Patient demonstrated good carryover for completion of seated exercise program, limited by fatigue. After a short rest, patient required Min/Mod assist for completion of sit to stand transfer from bed, and cuing to bear weight through B UE on RW. Patient ambulated in hallway using a RW, demonstrated slow labored movement, decreased cadence, and slight shakiness, distance limited d/t fatigue, no near loss of balance or c/o pain. Patient tolerated sitting up in chair after therapy w/ wife at bedside -nurse notified. Patient was encouraged to start using BSC and to sit up to maintain strength while in the hospital. Patient will benefit from continued skilled physical therapy in hospital and recommended venue below to increase strength, balance, endurance for safe ADLs and gait.   Recommendations for follow up therapy are one component of a multi-disciplinary discharge planning process, led by the attending physician.  Recommendations may be updated based on patient status, additional functional criteria and insurance authorization.  Follow Up  Recommendations  Home health PT     Assistance Recommended at Discharge Intermittent Supervision/Assistance  Equipment Recommendations  None recommended by PT    Recommendations for Other Services       Precautions / Restrictions Precautions Precautions: Fall Restrictions Weight Bearing Restrictions: No     Mobility  Bed Mobility Overal bed mobility: Needs Assistance Bed Mobility: Supine to Sit     Supine to sit: Min assist Sit to supine: Min assist   General bed mobility comments: Demonstrated labored movement, required increased time, and verbal cues for hand placement.    Transfers Overall transfer level: Needs assistance Equipment used: Rolling walker (2 wheels) Transfers: Sit to/from Stand;Bed to chair/wheelchair/BSC Sit to Stand: Min assist;Mod assist     Step pivot transfers: Min guard     General transfer comment: Slow labored movement with use of RW.    Ambulation/Gait Ambulation/Gait assistance: Min guard Gait Distance (Feet): 45 Feet Assistive device: Rolling walker (2 wheels) Gait Pattern/deviations: Decreased step length - right;Decreased step length - left;Decreased stride length;Narrow base of support Gait velocity: Decreased     General Gait Details: Demonstrated steadiness on feet, no near loss of balance, slow labored movement, slow cadence.   Stairs             Wheelchair Mobility    Modified Rankin (Stroke Patients Only)       Balance Overall balance assessment: Needs assistance Sitting-balance support: No upper extremity supported;Feet supported Sitting balance-Leahy Scale: Good Sitting balance - Comments: fair/good seated at EOB   Standing balance support: Bilateral upper extremity supported;During functional activity;Reliant on assistive device for balance Standing balance-Leahy Scale: Fair Standing balance comment: fair/good using RW  Cognition Arousal/Alertness:  Awake/alert Behavior During Therapy: WFL for tasks assessed/performed Overall Cognitive Status: Within Functional Limits for tasks assessed                                          Exercises General Exercises - Lower Extremity Long Arc Quad: Seated;AROM;Strengthening;Both;10 reps Hip Flexion/Marching: Seated;AROM;Strengthening;Both;10 reps Toe Raises: Seated;AROM;Strengthening;Both;10 reps Heel Raises: Seated;AROM;Strengthening;Both;10 reps    General Comments        Pertinent Vitals/Pain Pain Assessment: Faces Pain Score: 0-No pain    Home Living                          Prior Function            PT Goals (current goals can now be found in the care plan section) Acute Rehab PT Goals Patient Stated Goal: Return home PT Goal Formulation: With patient/family Time For Goal Achievement: 02/17/21 Potential to Achieve Goals: Good Progress towards PT goals: Progressing toward goals    Frequency    Min 3X/week      PT Plan Current plan remains appropriate    Co-evaluation              AM-PAC PT "6 Clicks" Mobility   Outcome Measure  Help needed turning from your back to your side while in a flat bed without using bedrails?: A Little Help needed moving from lying on your back to sitting on the side of a flat bed without using bedrails?: A Little Help needed moving to and from a bed to a chair (including a wheelchair)?: A Little Help needed standing up from a chair using your arms (e.g., wheelchair or bedside chair)?: A Little Help needed to walk in hospital room?: A Little Help needed climbing 3-5 steps with a railing? : A Lot 6 Click Score: 17    End of Session   Activity Tolerance: Patient tolerated treatment well;Patient limited by fatigue Patient left: in chair;with call bell/phone within reach;with family/visitor present Nurse Communication: Mobility status PT Visit Diagnosis: Unsteadiness on feet (R26.81);Other  abnormalities of gait and mobility (R26.89);Muscle weakness (generalized) (M62.81)     Time: 4967-5916 PT Time Calculation (min) (ACUTE ONLY): 20 min  Charges:  $Gait Training: 8-22 mins $Therapeutic Exercise: 8-22 mins                     Cassie Jones, SPT  During this treatment session, the therapist was present, participating in and directing the treatment.  11:38 AM, 02/10/21 Lonell Grandchild, MPT Physical Therapist with [facility] [phone] office [phone] mobile phone

## 2021-02-10 NOTE — Progress Notes (Signed)
Subjective:  Feels ok. Tolerated diet. No BM in more than 24 hours.   Objective: Vital signs in last 24 hours: Temp:  [97.2 F (36.2 C)-98.5 F (36.9 C)] 97.4 F (36.3 C) (11/18 0756) Pulse Rate:  [30-152] 47 (11/18 0900) Resp:  [12-28] 18 (11/18 0900) BP: (102-153)/(35-102) 153/57 (11/18 0900) SpO2:  [91 %-100 %] 98 % (11/18 0900) Weight:  [95.7 kg] 95.7 kg (11/18 0351) Last BM Date: 02/08/21 General:   Alert,  Well-developed, well-nourished, pleasant and cooperative in NAD Head:  Normocephalic and atraumatic. Eyes:  Sclera clear, no icterus.  Abdomen:  Soft, nontender and nondistended.  Normal bowel sounds, without guarding, and without rebound.   Extremities:  Without clubbing, deformity or edema. Neurologic:  Alert and  oriented x4;  grossly normal neurologically. Skin:  Intact without significant lesions or rashes. Psych:  Alert and cooperative. Normal mood and affect.  Intake/Output from previous day: 11/17 0701 - 11/18 0700 In: 1006.2 [I.V.:906.2; IV Piggyback:100] Out: 1550 [Urine:1550] Intake/Output this shift: Total I/O In: 64.6 [I.V.:64.6] Out: -   Lab Results: CBC Recent Labs    02/08/21 0430 02/08/21 1407 02/09/21 0433 02/10/21 0411  WBC 8.5  --  8.0 6.1  HGB 6.7* 8.4* 8.8* 9.0*  HCT 20.8* 25.2* 27.5* 28.9*  MCV 84.6  --  85.9 90.6  PLT 139*  --  144* 134*   BMET Recent Labs    02/08/21 0430 02/09/21 0433 02/10/21 0411  NA 133* 134* 137  K 3.3* 3.9 3.5  CL 100 102 104  CO2 25 23 24   GLUCOSE 89 89 67*  BUN 108* 102* 83*  CREATININE 2.14* 2.16* 1.84*  CALCIUM 8.3* 8.5* 8.3*   LFTs Recent Labs    02/08/21 1407  BILITOT 2.1*  BILIDIR 0.8*  IBILI 1.3*  ALKPHOS 121  AST 31  ALT 15  PROT 5.9*  ALBUMIN 3.0*   No results for input(s): LIPASE in the last 72 hours. PT/INR Recent Labs    02/08/21 1407  LABPROT 17.4*  INR 1.4*      Imaging Studies: CT ABDOMEN PELVIS WO CONTRAST  Result Date: 01/26/2021 CLINICAL DATA:  Acute  abdominal pain in the LEFT lower quadrant with nausea and vomiting that began this morning in a 75 year old male EXAM: CT ABDOMEN AND PELVIS WITHOUT CONTRAST TECHNIQUE: Multidetector CT imaging of the abdomen and pelvis was performed following the standard protocol without IV contrast. COMPARISON:  April 30, 2020. FINDINGS: Lower chest: Signs of trans arterial aortic valve replacement seen on scout images. Pacer device in place, incompletely evaluated, lead in the RIGHT heart as before. Small RIGHT-sided pleural effusion is diminished from more remote imaging. Mild septal thickening the lung bases without dense consolidative process. Lung base assessment mildly limited due to respiratory motion. Hepatobiliary: Nodular hepatic contours compatible with cirrhotic morphologic changes. Cholelithiasis, contracted gallbladder about gallstones. No gross lesion on noncontrast imaging. Pancreas: No stranding about the pancreas to the extent evaluated due to streak artifact from gastroduodenal artery coiling. Pancreatic atrophy as before. Spleen: Normal. Adrenals/Urinary Tract: Adrenal glands are normal. Smooth contour the bilateral kidneys. Chronic perinephric stranding is unchanged. Venous collateral pathways about the RIGHT urinary bladder similarly unchanged. No substantial perivesical stranding, nephrolithiasis, ureteral calculi or hydronephrosis. Stomach/Bowel: Sigmoid diverticular disease with question of mild stranding near the descending/sigmoid junction. No abscess or free air. Normal appendix. Stomach is distended without signs of adjacent stranding. Small bowel is nondilated. Ovoid low-density structure adjacent to the third portion of the duodenum measures 4.4 x 3.2  cm previously 5.1 x 3.6 cm and December of 2021 and not substantially changed compared to imaging from February of 2022. Vascular/Lymphatic: Aortic atherosclerosis. No sign of aneurysm. Smooth contour of the IVC. There is no gastrohepatic or  hepatoduodenal ligament lymphadenopathy. No retroperitoneal or mesenteric lymphadenopathy. No pelvic sidewall lymphadenopathy. Limited assessment of vascular structures without intravenous contrast on today's study. Reproductive: Unremarkable aside from adjacent collateral vessels near the urinary bladder and prostate gland, not well assessed given lack of contrast. Other: Moderate LEFT small RIGHT inguinal hernias containing fat. Musculoskeletal: No acute bone finding. No destructive bone process. Spinal degenerative changes. Degenerative changes are marked and worse at the L3-4 level. IMPRESSION: Suspect mild acute uncomplicated sigmoid diverticulitis at the descending/sigmoid junction. Pancolonic diverticulosis. Hepatic cirrhosis. Small RIGHT-sided pleural effusion is diminished from more remote imaging. 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 Cholelithiasis, contracted gallbladder about gallstones. Aortic Atherosclerosis (ICD10-I70.0). Electronically Signed   By: Zetta Bills M.D.   On: 01/26/2021 17:18   CUP PACEART REMOTE DEVICE CHECK  Result Date: 01/15/2021 Scheduled remote reviewed. Normal device function.  AF burden 100%. Ave V response 60's-70's bpm. No OAC due to bleed. Next remote 91 days. LH  US LIVER DOPPLER  Result Date: 02/09/2021 CLINICAL DATA:  Cirrhosis.  Assess portal vein patency. EXAM: DUPLEX ULTRASOUND OF LIVER TECHNIQUE: Color and duplex Doppler ultrasound was performed to evaluate the hepatic in-flow and out-flow vessels. COMPARISON:  RIGHT quadrant ultrasound 09/16/2020. Ultrasound elastography, 09/16/2020. CT AP, 01/26/2021. FINDINGS: Liver: Increased hepatic parenchymal echogenicity. Grossly nodular liver contour. No focal lesion, mass or intrahepatic biliary ductal dilatation. Main Portal Vein size: 0.8 cm Portal Vein Velocities Main Prox:  19 cm/sec Main  Mid: 16 cm/sec Main Dist:  19 cm/sec Right: 26 cm/sec Left: 18 cm/sec Hepatic Vein Velocities Right:  55 cm/sec Middle:  49 cm/sec Left:  72 cm/sec IVC: Present and patent with normal respiratory phasicity. Hepatic Artery Velocity:  54 cm/sec Splenic Vein Velocity:  35 cm/sec Spleen: 5.4 x 4 7 x 12.4 cm with a total volume of 163 cm^3 (411 cm^3 is upper limit normal) Portal Vein Occlusion/Thrombus: No Splenic Vein Occlusion/Thrombus: No Ascites: None Varices: None IMPRESSION: 1. Cirrhotic morphology liver.  No sonographic evidence of hepatoma. 2. Patent portal and splenic veins. Michaelle Birks, MD [facility] Electronically Signed   By: Michaelle Birks M.D.   On: 02/09/2021 17:32  [2 weeks]   Assessment: 75 year old male with history of Nash cirrhosis, duodenal ulcer status post embolization in July 2022 and surveillance EGD January 27, 2021 with healed ulcer and portal gastropathy, history of diverticulitis January 26, 2021 while inpatient, IDA who presented to the ED this admission with recurring symptomatic anemia and melena.  Acute blood loss anemia with melena: FOBT positive on admission.  Hemoglobin 6.4 initially, down from 8 last week.  Received 2 units of packed red blood cells with improvement in hemoglobin to 8.4.  Hemoglobin trickled back down to 6.7, received 2 additional units of blood for total of 4.  Hemoglobin this morning 9.0.  He was started on Rocephin for SBP prophylaxis.  Initially octreotide not given due to NO prior history of varices on recent EGD.  Capsule study initially performed.  Unfortunately study was incomplete, capsule did not reach the cecum.  Multiple gastric images showed blood and clots, coffee-ground material as well.  Patient had 2 episodes of significant  melena.  Octreotide started 02/08/21.  EGD yesterday showed nonobstructing Schatzki ring in the distal esophagus, small hiatal hernia, moderate portal hypertensive  gastropathy in the gastric fundus and gastric body.  20 mm healed ulcer found the duodenal bulb.  Scar tissue was healthy in appearance, no stigmata of bleeding.  NASH cirrhosis/pruritus: Alk phos 176 on admission with normal transaminases, total bilirubin 2.3, INR 1.5.  Platelets 176,000.  Urso 600 mg twice daily was supposed to be started for possible cholestasis resulting in pruritus but was discontinued and Vistaril given instead.  Pruritus improved.  Meld this admission 24. Dopper liver yesterday with patent portal and splenic veins.   Plan: If recurrent GI bleeding, recommend tagged RBC scan. We will plan for outpatient colonoscopy.  Will not rule out possible repeat capsule endoscopy given incomplete inpatient study (capsule did not reach cecum). Complete 5-day course of antibiotics for SBP prophylaxis. Abd xray to verify capsule has passed beyond small bowel.  Taper off octreotide.   Laureen Ochs. Bernarda Caffey [facility] [phone] 11/18/20221:24 PM    LOS: 4 days

## 2021-02-11 DIAGNOSIS — N1832 Chronic kidney disease, stage 3b: Secondary | ICD-10-CM

## 2021-02-11 DIAGNOSIS — K922 Gastrointestinal hemorrhage, unspecified: Secondary | ICD-10-CM

## 2021-02-11 DIAGNOSIS — D500 Iron deficiency anemia secondary to blood loss (chronic): Secondary | ICD-10-CM | POA: Diagnosis not present

## 2021-02-11 DIAGNOSIS — K7460 Unspecified cirrhosis of liver: Secondary | ICD-10-CM

## 2021-02-11 DIAGNOSIS — I5043 Acute on chronic combined systolic (congestive) and diastolic (congestive) heart failure: Secondary | ICD-10-CM | POA: Diagnosis not present

## 2021-02-11 LAB — CBC
HCT: 29 % — ABNORMAL LOW (ref 39.0–52.0)
Hemoglobin: 9.1 g/dL — ABNORMAL LOW (ref 13.0–17.0)
MCH: 28.4 pg (ref 26.0–34.0)
MCHC: 31.4 g/dL (ref 30.0–36.0)
MCV: 90.6 fL (ref 80.0–100.0)
Platelets: 119 10*3/uL — ABNORMAL LOW (ref 150–400)
RBC: 3.2 MIL/uL — ABNORMAL LOW (ref 4.22–5.81)
RDW: 22.5 % — ABNORMAL HIGH (ref 11.5–15.5)
WBC: 5.7 10*3/uL (ref 4.0–10.5)
nRBC: 0 % (ref 0.0–0.2)

## 2021-02-11 LAB — BASIC METABOLIC PANEL
Anion gap: 6 (ref 5–15)
BUN: 58 mg/dL — ABNORMAL HIGH (ref 8–23)
CO2: 25 mmol/L (ref 22–32)
Calcium: 8.1 mg/dL — ABNORMAL LOW (ref 8.9–10.3)
Chloride: 105 mmol/L (ref 98–111)
Creatinine, Ser: 1.44 mg/dL — ABNORMAL HIGH (ref 0.61–1.24)
GFR, Estimated: 51 mL/min — ABNORMAL LOW (ref >60)
Glucose, Bld: 58 mg/dL — ABNORMAL LOW (ref 70–99)
Potassium: 3.7 mmol/L (ref 3.5–5.1)
Sodium: 136 mmol/L (ref 135–145)

## 2021-02-11 LAB — GLUCOSE, CAPILLARY
Glucose-Capillary: 39 mg/dL — CL (ref 70–99)
Glucose-Capillary: 89 mg/dL (ref 70–99)
Glucose-Capillary: 155 mg/dL — ABNORMAL HIGH (ref 70–99)
Glucose-Capillary: 182 mg/dL — ABNORMAL HIGH (ref 70–99)
Glucose-Capillary: 278 mg/dL — ABNORMAL HIGH (ref 70–99)
Glucose-Capillary: 324 mg/dL — ABNORMAL HIGH (ref 70–99)
Glucose-Capillary: 192 mg/dL — ABNORMAL HIGH (ref 70–99)

## 2021-02-11 MED ORDER — DEXTROSE 50 % IV SOLN: 1.0000 | Freq: Once | INTRAVENOUS | Status: DC

## 2021-02-11 MED ORDER — INSULIN ASPART 100 UNIT/ML IJ SOLN: 0.0000 [IU] | Freq: Every day | INTRAMUSCULAR | Status: DC

## 2021-02-11 MED ORDER — INSULIN GLARGINE-YFGN 100 UNIT/ML ~~LOC~~ SOLN
12.0000 [IU] | Freq: Every day | SUBCUTANEOUS | Status: DC
  Administered 2021-02-11: 12 [IU] via SUBCUTANEOUS
  Filled 2021-02-11 (×2): qty 0.12

## 2021-02-11 MED ORDER — INSULIN ASPART 100 UNIT/ML IJ SOLN
0.0000 [IU] | Freq: Three times a day (TID) | INTRAMUSCULAR | Status: DC
  Administered 2021-02-11: 11 [IU] via SUBCUTANEOUS

## 2021-02-11 MED ORDER — SPIRONOLACTONE 12.5 MG HALF TABLET
12.5000 mg | ORAL_TABLET | Freq: Every day | ORAL | Status: DC
  Filled 2021-02-11 (×2): qty 1

## 2021-02-11 NOTE — Progress Notes (Signed)
Subjective:  Patient has no complaints.  His appetite is good.  He ate all of his breakfast.  He denies nausea vomiting or abdominal pain.  His bowels did move and there were dark brown but not black.  He has been walking in the room without getting lightheaded.   Current Facility-Administered Medications:    0.9 %  sodium chloride infusion (Manually program via Guardrails IV Fluids), , Intravenous, Once, Zierle-Ghosh, Asia B, DO, Held at 02/08/21 0217   0.9 %  sodium chloride infusion, 250 mL, Intravenous, PRN, Shahmehdi, Seyed A, MD   0.9 %  sodium chloride infusion, 250 mL, Intravenous, Continuous, Shahmehdi, Seyed A, MD, Stopped at 02/08/21 0150   acetaminophen (TYLENOL) tablet 650 mg, 650 mg, Oral, Q6H PRN, 650 mg at 02/07/21 2100 **OR** acetaminophen (TYLENOL) suppository 650 mg, 650 mg, Rectal, Q6H PRN, Shahmehdi, Seyed A, MD   bisacodyl (DULCOLAX) EC tablet 5 mg, 5 mg, Oral, Daily PRN, Shahmehdi, Seyed A, MD   camphor-menthol (SARNA) lotion 1 application, 1 application, Topical, Daily PRN, Shahmehdi, Seyed A, MD   carvedilol (COREG) tablet 3.125 mg, 3.125 mg, Oral, BID WC, Shahmehdi, Seyed A, MD, 3.125 mg at 02/11/21 0930   cefTRIAXone (ROCEPHIN) 2 g in sodium chloride 0.9 % 100 mL IVPB, 2 g, Intravenous, Q24H, Annitta Needs, NP, Last Rate: 200 mL/hr at 02/10/21 1640, 2 g at 02/10/21 1640   Chlorhexidine Gluconate Cloth 2 % PADS 6 each, 6 each, Topical, Daily, Shahmehdi, Seyed A, MD, 6 each at 02/11/21 0930   guaiFENesin-dextromethorphan (ROBITUSSIN DM) 100-10 MG/5ML syrup 5 mL, 5 mL, Oral, Q4H PRN, Denton Brick, Courage, MD, 5 mL at 02/10/21 1512   hydrALAZINE (APRESOLINE) injection 10 mg, 10 mg, Intravenous, Q4H PRN, Shahmehdi, Seyed A, MD   hydrocortisone cream 1 % 1 application, 1 application, Topical, QID, Shahmehdi, Seyed A, MD, 1 application at 17/00/17 1017   HYDROmorphone (DILAUDID) injection 0.5-1 mg, 0.5-1 mg, Intravenous, Q2H PRN, Shahmehdi, Seyed A, MD   hydrOXYzine  (ATARAX/VISTARIL) tablet 10 mg, 10 mg, Oral, Q4H PRN, Shahmehdi, Seyed A, MD, 10 mg at 02/10/21 2145   insulin glargine-yfgn (SEMGLEE) injection 12 Units, 12 Units, Subcutaneous, QHS, Johnson, Clanford L, MD   ipratropium (ATROVENT) nebulizer solution 0.5 mg, 0.5 mg, Nebulization, Q6H PRN, Shahmehdi, Seyed A, MD   levalbuterol (XOPENEX) nebulizer solution 0.63 mg, 0.63 mg, Nebulization, Q6H PRN, Shahmehdi, Seyed A, MD   linaclotide (LINZESS) capsule 145 mcg, 145 mcg, Oral, QAC breakfast, Shahmehdi, Seyed A, MD, 145 mcg at 02/11/21 0930   ondansetron (ZOFRAN) tablet 4 mg, 4 mg, Oral, Q6H PRN **OR** ondansetron (ZOFRAN) injection 4 mg, 4 mg, Intravenous, Q6H PRN, Shahmehdi, Seyed A, MD, 4 mg at 02/07/21 4944   oxyCODONE (Oxy IR/ROXICODONE) immediate release tablet 5 mg, 5 mg, Oral, Q4H PRN, Shahmehdi, Seyed A, MD   pantoprazole (PROTONIX) injection 40 mg, 40 mg, Intravenous, Q12H, Cyndi Bender, Anna W, NP, 40 mg at 02/11/21 0930   senna-docusate (Senokot-S) tablet 1 tablet, 1 tablet, Oral, QHS PRN, Shahmehdi, Seyed A, MD   sodium chloride flush (NS) 0.9 % injection 3 mL, 3 mL, Intravenous, Q12H, Shahmehdi, Seyed A, MD, 3 mL at 02/11/21 0931   sodium chloride flush (NS) 0.9 % injection 3 mL, 3 mL, Intravenous, Q12H, Shahmehdi, Seyed A, MD, 3 mL at 02/11/21 0930   sodium chloride flush (NS) 0.9 % injection 3 mL, 3 mL, Intravenous, PRN, Shahmehdi, Seyed A, MD   sucralfate (CARAFATE) tablet 1 g, 1 g, Oral, QID, Shahmehdi, Seyed A, MD, 1 g  at 02/11/21 0931   traZODone (DESYREL) tablet 25 mg, 25 mg, Oral, QHS PRN, Shahmehdi, Seyed A, MD, 25 mg at 02/10/21 2130   Objective: Blood pressure 112/82, pulse (!) 101, temperature 98.1 F (36.7 C), resp. rate 16, height 6' (1.829 m), weight 95.7 kg, SpO2 100 %. Patient is alert and in no acute distress. He does not have asterixis. Abdomen is soft and nontender with organomegaly or masses. No peripheral edema noted.   Labs/studies Results:   CBC Latest Ref Rng &  Units 02/11/2021 02/10/2021 02/09/2021  WBC 4.0 - 10.5 K/uL 5.7 6.1 8.0  Hemoglobin 13.0 - 17.0 g/dL 9.1(L) 9.0(L) 8.8(L)  Hematocrit 39.0 - 52.0 % 29.0(L) 28.9(L) 27.5(L)  Platelets 150 - 400 K/uL 119(L) 134(L) 144(L)    CMP Latest Ref Rng & Units 02/11/2021 02/10/2021 02/09/2021  Glucose 70 - 99 mg/dL 58(L) 67(L) 89  BUN 8 - 23 mg/dL 58(H) 83(H) 102(H)  Creatinine 0.61 - 1.24 mg/dL 1.44(H) 1.84(H) 2.16(H)  Sodium 135 - 145 mmol/L 136 137 134(L)  Potassium 3.5 - 5.1 mmol/L 3.7 3.5 3.9  Chloride 98 - 111 mmol/L 105 104 102  CO2 22 - 32 mmol/L 25 24 23   Calcium 8.9 - 10.3 mg/dL 8.1(L) 8.3(L) 8.5(L)  Total Protein 6.5 - 8.1 g/dL - - -  Total Bilirubin 0.3 - 1.2 mg/dL - - -  Alkaline Phos 38 - 126 U/L - - -  AST 15 - 41 U/L - - -  ALT 0 - 44 U/L - - -    Hepatic Function Latest Ref Rng & Units 02/08/2021 02/06/2021 01/26/2021  Total Protein 6.5 - 8.1 g/dL 5.9(L) 6.1(L) 6.7  Albumin 3.5 - 5.0 g/dL 3.0(L) 2.6(L) 2.8(L)  AST 15 - 41 U/L 31 27 35  ALT 0 - 44 U/L 15 16 22   Alk Phosphatase 38 - 126 U/L 121 176(H) 251(H)  Total Bilirubin 0.3 - 1.2 mg/dL 2.1(H) 2.3(H) 2.0(H)  Bilirubin, Direct 0.0 - 0.2 mg/dL 0.8(H) - -    KUB from yesterday does not reveal foreign body/given capsule which means he has passed it.  Assessment:  #1.  Acute GI bleed.  Patient presented with melena.  He has received 4 units of PRBCs.  He has undergone 2 EGDs and given capsule in between.  First EGD showed healed duodenal ulcer(GI bleed in July 2022) and portal gastropathy.  Second EGD was done after he had small bowel given capsule study which revealed old blood in the stomach but second EGD did not reveal any new findings.  Octreotide has been tapered and he has not had any evidence of recurrent bleed. Doppler study revealed patent portal vein. Last colonoscopy was in January 2019.  Therefore we are considering colonoscopy on an outpatient basis.  #3.  NASH cirrhosis.  This was picked up on CT 1 year ago.   Markers for hepatitis B and C were negative.  Antimitochondrial antibody was negative as were the markers for hemochromatosis and autoimmune disease. His disease is complicated by thrombocytopenia.  Plan:  Patient encouraged to ambulate on the floor. Repeat lab in AM. Outpatient colonoscopy. If patient returns with another episode of GI bleed would proceed with GI bleeding scan on urgent basis.  Patient not a candidate for CTA because of chronic kidney disease.

## 2021-02-11 NOTE — Progress Notes (Signed)
PROGRESS NOTE   John [name]  DJS:[id number] DOB: Sep 08, 1945 DOA: 02/06/2021 PCP: [name], MD   No chief complaint on file.  Level of care: Telemetry  Brief Admission History:  Patient is a 75 year old male with history of systolic and diastolic CHF, DM 2, hypertension, Nash cirrhosis, CKD stage IIIb, heart block status post PPM, HLD, persistent atrial fibrillation off chronic anticoagulation due to GI bleed presented with melanotic black stools for episodes on the morning of admission, lower extremity edema, shortness of breath with orthopnea.  Patient was noted to have hemoglobin of 6.4, Hemoccult positive.  Patient was admitted for further work-up.  Recently hospitalized 11/3-11/6 for GI bleed and was diagnosed with diverticulitis and duodenal ulcer.    Assessment & Plan:   Principal Problem:   Acute GI bleeding Active Problems:   DM type 2 causing vascular disease (HCC)   Mixed hyperlipidemia   Essential hypertension, benign   Atrial flutter (HCC)   CKD (chronic kidney disease) stage 3, GFR 30-59 ml/min (HCC)   Paroxysmal atrial fibrillation (HCC)   Hyponatremia   Acute on chronic combined systolic and diastolic CHF (congestive heart failure) (HCC)   Severe aortic stenosis   S/P TAVR (transcatheter aortic valve replacement)   Anemia   Melena   Cirrhosis (HCC)   Diverticulitis of colon  Acute GI bleed  - s/p 4 units PRBC - follow Hg - octreotide taper  - follow up GI recommendations - CBC in AM  - per GI if another episode of GI bleed would plan on GI bleeding scan   NICM - resume home carvedilol - slowly restart cardiac meds as tolerated   Type 2 DM with renal complications, uncontrolled as evidenced by A1c 9.2%  - Pt had severe low BS this morning - reduced basal insulin to 15 units - added prandial coverage and SSI coverage - Monitor CBG 5x per day.  Chronic atrial fibrillation - history of TAVR 2/22 - continue carvedilol 3.125 mg BID - no  anticoagulation in setting of GI bleed   Hyponatremia  - stable, following  Diverticulosis  - stable currently   DVT prophylaxis: SCDs Code Status: Full  Family Communication: wife at bedside  Disposition: home tomorrow if stable  Status is: Inpatient  Remains inpatient appropriate because: GI bleeding    Consultants:  GI   Procedures:    Antimicrobials:     Subjective: Pt reports that he is feeling much better today and tolerating his breakfast.  No further bleeding reported.    Objective: Vitals:   02/11/21 0500 02/11/21 0848 02/11/21 1217 02/11/21 1330  BP:  112/82 (!) 117/57 (!) 100/51  Pulse:  (!) 101 61 67  Resp:  16 16 16   Temp:  98.1 F (36.7 C) 98.4 F (36.9 C) 98.8 F (37.1 C)  TempSrc:    Oral  SpO2:  100% 100% 100%  Weight: 95.7 kg     Height:        Intake/Output Summary (Last 24 hours) at 02/11/2021 1801 Last data filed at 02/11/2021 1300 Gross per 24 hour  Intake 726.41 ml  Output 500 ml  Net 226.41 ml   Filed Weights   02/09/21 0500 02/10/21 0351 02/11/21 0500  Weight: 95.6 kg 95.7 kg 95.7 kg    Examination:  General exam: Appears calm and comfortable  Respiratory system: Clear to auscultation. Respiratory effort normal. Cardiovascular system: normal S1 & S2 heard. No JVD, murmurs, rubs, gallops or clicks. No pedal edema. Gastrointestinal system: Abdomen is  nondistended, soft and nontender. No organomegaly or masses felt. Normal bowel sounds heard. Central nervous system: Alert and oriented. No focal neurological deficits. Extremities: Symmetric 5 x 5 power. Skin: No rashes, lesions or ulcers Psychiatry: Judgement and insight appear normal. Mood & affect appropriate.   Data Reviewed: I have personally reviewed following labs and imaging studies  CBC: Recent Labs  Lab 02/06/21 1300 02/07/21 0957 02/08/21 0014 02/08/21 0430 02/08/21 1407 02/09/21 0433 02/10/21 0411 02/11/21 0518  WBC 8.1 11.7*  --  8.5  --  8.0 6.1 5.7   NEUTROABS 5.7  --   --   --   --   --   --   --   HGB 6.4* 8.4*   < > 6.7* 8.4* 8.8* 9.0* 9.1*  HCT 21.6* 25.1*   < > 20.8* 25.2* 27.5* 28.9* 29.0*  MCV 87.1 82.6  --  84.6  --  85.9 90.6 90.6  PLT 176 189  --  139*  --  144* 134* 119*   < > = values in this interval not displayed.    Basic Metabolic Panel: Recent Labs  Lab 02/07/21 0957 02/08/21 0430 02/08/21 1407 02/09/21 0433 02/10/21 0411 02/11/21 0518  NA 133* 133*  --  134* 137 136  K 3.6 3.3*  --  3.9 3.5 3.7  CL 100 100  --  102 104 105  CO2 23 25  --  23 24 25   GLUCOSE 167* 89  --  89 67* 58*  BUN 111* 108*  --  102* 83* 58*  CREATININE 1.99* 2.14*  --  2.16* 1.84* 1.44*  CALCIUM 8.8* 8.3*  --  8.5* 8.3* 8.1*  MG  --   --  2.1  --   --   --     GFR: Estimated Creatinine Clearance: 53.2 mL/min (A) (by C-G formula based on SCr of 1.44 mg/dL (H)).  Liver Function Tests: Recent Labs  Lab 02/06/21 1300 02/08/21 1407  AST 27 31  ALT 16 15  ALKPHOS 176* 121  BILITOT 2.3* 2.1*  PROT 6.1* 5.9*  ALBUMIN 2.6* 3.0*    CBG: Recent Labs  Lab 02/11/21 0717 02/11/21 0755 02/11/21 0905 02/11/21 1116 02/11/21 1609  GLUCAP 39* 89 155* 182* 278*    Recent Results (from the past 240 hour(s))  Resp Panel by RT-PCR (Flu A&B, Covid) Nasopharyngeal Swab     Status: None   Collection Time: 02/06/21  1:51 PM   Specimen: Nasopharyngeal Swab; Nasopharyngeal(NP) swabs in vial transport medium  Result Value Ref Range Status   SARS Coronavirus 2 by RT PCR NEGATIVE NEGATIVE Final    Comment: (NOTE) SARS-CoV-2 target nucleic acids are NOT DETECTED.  The SARS-CoV-2 RNA is generally detectable in upper respiratory specimens during the acute phase of infection. The lowest concentration of SARS-CoV-2 viral copies this assay can detect is 138 copies/mL. A negative result does not preclude SARS-Cov-2 infection and should not be used as the sole basis for treatment or other patient management decisions. A negative result may  occur with  improper specimen collection/handling, submission of specimen other than nasopharyngeal swab, presence of viral mutation(s) within the areas targeted by this assay, and inadequate number of viral copies(<138 copies/mL). A negative result must be combined with clinical observations, patient history, and epidemiological information. The expected result is Negative.  Fact Sheet for Patients:  EntrepreneurPulse.com.au  Fact Sheet for Healthcare Providers:  IncredibleEmployment.be  This test is no t yet approved or cleared by the [place] and  has been authorized for detection and/or diagnosis of SARS-CoV-2 by FDA under an Emergency Use Authorization (EUA). This EUA will remain  in effect (meaning this test can be used) for the duration of the COVID-19 declaration under Section 564(b)(1) of the Act, 21 U.S.C.section 360bbb-3(b)(1), unless the authorization is terminated  or revoked sooner.       Influenza A by PCR NEGATIVE NEGATIVE Final   Influenza B by PCR NEGATIVE NEGATIVE Final    Comment: (NOTE) The Xpert Xpress SARS-CoV-2/FLU/RSV plus assay is intended as an aid in the diagnosis of influenza from Nasopharyngeal swab specimens and should not be used as a sole basis for treatment. Nasal washings and aspirates are unacceptable for Xpert Xpress SARS-CoV-2/FLU/RSV testing.  Fact Sheet for Patients: EntrepreneurPulse.com.au  Fact Sheet for Healthcare Providers: IncredibleEmployment.be  This test is not yet approved or cleared by the [place] FDA and has been authorized for detection and/or diagnosis of SARS-CoV-2 by FDA under an Emergency Use Authorization (EUA). This EUA will remain in effect (meaning this test can be used) for the duration of the COVID-19 declaration under Section 564(b)(1) of the Act, 21 U.S.C. section 360bbb-3(b)(1), unless the authorization is terminated  or revoked.  Performed at [facility], [address], [place], [place] [address]   MRSA Next Gen by PCR, Nasal     Status: None   Collection Time: 02/06/21  8:20 PM   Specimen: Nasal Mucosa; Nasal Swab  Result Value Ref Range Status   MRSA by PCR Next Gen NOT DETECTED NOT DETECTED Final    Comment: (NOTE) The GeneXpert MRSA Assay (FDA approved for NASAL specimens only), is one component of a comprehensive MRSA colonization surveillance program. It is not intended to diagnose MRSA infection nor to guide or monitor treatment for MRSA infections. Test performance is not FDA approved in patients less than 34 years old. Performed at [facility], [address], [place], [place] [address]      Radiology Studies: DG Abd 1 View  Result Date: 02/10/2021 CLINICAL DATA:  Video capsule location EXAM: ABDOMEN - 1 VIEW COMPARISON:  CT abdomen pelvis 01/26/2021 FINDINGS: Normal bowel gas pattern. No obstruction or ileus. No retained video capsule identified in the abdomen. Embolization coils overlying the duodenum. Dual lead pacemaker.  TAVR. IMPRESSION: Normal bowel gas pattern. No video capsule device identified in the abdomen Electronically Signed   By: Franchot Gallo M.D.   On: 02/10/2021 15:13    Scheduled Meds:  sodium chloride   Intravenous Once   carvedilol  3.125 mg Oral BID WC   Chlorhexidine Gluconate Cloth  6 each Topical Daily   hydrocortisone cream  1 application Topical QID   insulin aspart  0-15 Units Subcutaneous TID WC   insulin aspart  0-5 Units Subcutaneous QHS   insulin glargine-yfgn  12 Units Subcutaneous QHS   linaclotide  145 mcg Oral QAC breakfast   pantoprazole (PROTONIX) IV  40 mg Intravenous Q12H   sodium chloride flush  3 mL Intravenous Q12H   sodium chloride flush  3 mL Intravenous Q12H   sucralfate  1 g Oral QID   Continuous Infusions:  sodium chloride     sodium chloride Stopped (02/08/21 0150)   cefTRIAXone (ROCEPHIN)  IV 2 g (02/10/21 1640)    LOS: 5  days   Time spent: 35 minutes   Jadia Capers Wynetta Emery, MD How to contact the [facility] Attending or Consulting provider [place] or covering provider during after hours [place], for this patient?  Check the care team in [facility] and look for a) attending/consulting TR[facility] provider listed and b) the [facility] team listed Log into [email or web address] and use 's universal password to access. If you do not have the password, please contact the hospital operator. Locate the [facility] provider you are looking for under [facility] and page to a number that you can be directly reached. If you still have difficulty reaching the provider, please page the [facility] (Director on Call) for the Hospitalists listed on amion for assistance.  02/11/2021, 6:01 PM

## 2021-02-11 NOTE — Progress Notes (Signed)
Hypoglycemic Event  CBG: 39 mg/dL  Treatment: 8 oz juice/soda  Symptoms: None  Follow-up CBG: Time:0755 CBG Result:89 mg/dL  Possible Reasons for Event: Unknown  Comments/MD notified: MD Wynetta Emery made aware via amion and secure chat. Patient alert and verbal. No s/s of distress. Patient given 8 oz of juice per protocol. Was instructed to give D50 if not corrected with juice. Re-checked 1 hour after previous BG check per MD Wynetta Emery. Blood Glucose was 155 mg/dL.

## 2021-02-12 DIAGNOSIS — D500 Iron deficiency anemia secondary to blood loss (chronic): Secondary | ICD-10-CM | POA: Diagnosis not present

## 2021-02-12 DIAGNOSIS — E1159 Type 2 diabetes mellitus with other circulatory complications: Secondary | ICD-10-CM | POA: Diagnosis not present

## 2021-02-12 DIAGNOSIS — I5043 Acute on chronic combined systolic (congestive) and diastolic (congestive) heart failure: Secondary | ICD-10-CM | POA: Diagnosis not present

## 2021-02-12 DIAGNOSIS — K922 Gastrointestinal hemorrhage, unspecified: Secondary | ICD-10-CM | POA: Diagnosis not present

## 2021-02-12 LAB — CBC
HCT: 27.6 % — ABNORMAL LOW (ref 39.0–52.0)
Hemoglobin: 8.5 g/dL — ABNORMAL LOW (ref 13.0–17.0)
MCH: 27.7 pg (ref 26.0–34.0)
MCHC: 30.8 g/dL (ref 30.0–36.0)
MCV: 89.9 fL (ref 80.0–100.0)
Platelets: 93 10*3/uL — ABNORMAL LOW (ref 150–400)
RBC: 3.07 MIL/uL — ABNORMAL LOW (ref 4.22–5.81)
RDW: 22.1 % — ABNORMAL HIGH (ref 11.5–15.5)
WBC: 5.9 10*3/uL (ref 4.0–10.5)
nRBC: 0 % (ref 0.0–0.2)

## 2021-02-12 LAB — BASIC METABOLIC PANEL
Anion gap: 7 (ref 5–15)
BUN: 52 mg/dL — ABNORMAL HIGH (ref 8–23)
CO2: 23 mmol/L (ref 22–32)
Calcium: 7.9 mg/dL — ABNORMAL LOW (ref 8.9–10.3)
Chloride: 103 mmol/L (ref 98–111)
Creatinine, Ser: 1.56 mg/dL — ABNORMAL HIGH (ref 0.61–1.24)
GFR, Estimated: 46 mL/min — ABNORMAL LOW (ref >60)
Glucose, Bld: 103 mg/dL — ABNORMAL HIGH (ref 70–99)
Potassium: 4.1 mmol/L (ref 3.5–5.1)
Sodium: 133 mmol/L — ABNORMAL LOW (ref 135–145)

## 2021-02-12 LAB — GLUCOSE, CAPILLARY
Glucose-Capillary: 101 mg/dL — ABNORMAL HIGH (ref 70–99)
Glucose-Capillary: 111 mg/dL — ABNORMAL HIGH (ref 70–99)
Glucose-Capillary: 131 mg/dL — ABNORMAL HIGH (ref 70–99)
Glucose-Capillary: 78 mg/dL (ref 70–99)

## 2021-02-12 MED ORDER — PANTOPRAZOLE SODIUM 40 MG PO TBEC: 40.0000 mg | DELAYED_RELEASE_TABLET | Freq: Every day | ORAL | Status: DC

## 2021-02-12 MED ORDER — PANTOPRAZOLE SODIUM 40 MG PO TBEC: 40.0000 mg | DELAYED_RELEASE_TABLET | Freq: Every day | ORAL | 1 refills | Status: AC

## 2021-02-12 MED ORDER — SPIRONOLACTONE 25 MG PO TABS: 12.5000 mg | ORAL_TABLET | Freq: Every day | ORAL | 6 refills | Status: DC

## 2021-02-12 MED ORDER — POTASSIUM CHLORIDE CRYS ER 20 MEQ PO TBCR: 20.0000 meq | EXTENDED_RELEASE_TABLET | Freq: Two times a day (BID) | ORAL | Status: DC

## 2021-02-12 MED ORDER — TORSEMIDE 20 MG PO TABS: 20.0000 mg | ORAL_TABLET | Freq: Two times a day (BID) | ORAL | Status: DC

## 2021-02-12 MED ORDER — TORSEMIDE 20 MG PO TABS
20.0000 mg | ORAL_TABLET | Freq: Two times a day (BID) | ORAL | Status: DC
  Administered 2021-02-12: 20 mg via ORAL
  Filled 2021-02-12: qty 1

## 2021-02-12 MED ORDER — METOLAZONE 2.5 MG PO TABS: 2.5000 mg | ORAL_TABLET | ORAL | 4 refills | Status: DC

## 2021-02-12 MED ORDER — TRESIBA FLEXTOUCH 100 UNIT/ML ~~LOC~~ SOPN
12.0000 [IU] | PEN_INJECTOR | Freq: Every day | SUBCUTANEOUS | 2 refills | Status: DC
Start: 2021-02-13 — End: 2021-04-04

## 2021-02-12 NOTE — Progress Notes (Signed)
Pt discharged and taken to main entrance via wheelchair by staff. Pt's wife awaiting.

## 2021-02-12 NOTE — Progress Notes (Signed)
Telemetry called and said patient had 18 beats of V tach. MD Adefeso notified. Will continue to monitor.

## 2021-02-12 NOTE — Progress Notes (Signed)
Subjective:  Patient has no complaints.  He has been walking in the room without getting short of breath or lightheaded.  He passed formed brown stool this morning.  Current Medications:  Current Facility-Administered Medications:    0.9 %  sodium chloride infusion (Manually program via Guardrails IV Fluids), , Intravenous, Once, Zierle-Ghosh, Asia B, DO, Held at 02/08/21 0217   0.9 %  sodium chloride infusion, 250 mL, Intravenous, PRN, Shahmehdi, Seyed A, MD   0.9 %  sodium chloride infusion, 250 mL, Intravenous, Continuous, Shahmehdi, Seyed A, MD, Stopped at 02/08/21 0150   acetaminophen (TYLENOL) tablet 650 mg, 650 mg, Oral, Q6H PRN, 650 mg at 02/07/21 2100 **OR** acetaminophen (TYLENOL) suppository 650 mg, 650 mg, Rectal, Q6H PRN, Shahmehdi, Seyed A, MD   bisacodyl (DULCOLAX) EC tablet 5 mg, 5 mg, Oral, Daily PRN, Shahmehdi, Seyed A, MD   camphor-menthol (SARNA) lotion 1 application, 1 application, Topical, Daily PRN, Shahmehdi, Seyed A, MD   carvedilol (COREG) tablet 3.125 mg, 3.125 mg, Oral, BID WC, Shahmehdi, Seyed A, MD, 3.125 mg at 02/12/21 0831   cefTRIAXone (ROCEPHIN) 2 g in sodium chloride 0.9 % 100 mL IVPB, 2 g, Intravenous, Q24H, Annitta Needs, NP, Stopped at 02/11/21 1937   Chlorhexidine Gluconate Cloth 2 % PADS 6 each, 6 each, Topical, Daily, Shahmehdi, Seyed A, MD, 6 each at 02/11/21 0930   guaiFENesin-dextromethorphan (ROBITUSSIN DM) 100-10 MG/5ML syrup 5 mL, 5 mL, Oral, Q4H PRN, [place] Brick, Courage, MD, 5 mL at 02/12/21 0851   hydrALAZINE (APRESOLINE) injection 10 mg, 10 mg, Intravenous, Q4H PRN, Shahmehdi, Seyed A, MD   hydrocortisone cream 1 % 1 application, 1 application, Topical, QID, Shahmehdi, Seyed A, MD, 1 application at 61/68/37 1840   HYDROmorphone (DILAUDID) injection 0.5-1 mg, 0.5-1 mg, Intravenous, Q2H PRN, Shahmehdi, Seyed A, MD   hydrOXYzine (ATARAX/VISTARIL) tablet 10 mg, 10 mg, Oral, Q4H PRN, Shahmehdi, Seyed A, MD, 10 mg at 02/10/21 2145   insulin aspart  (novoLOG) injection 0-15 Units, 0-15 Units, Subcutaneous, TID WC, Johnson, Clanford L, MD, 11 Units at 02/11/21 1840   insulin aspart (novoLOG) injection 0-5 Units, 0-5 Units, Subcutaneous, QHS, Johnson, Clanford L, MD   insulin glargine-yfgn (SEMGLEE) injection 12 Units, 12 Units, Subcutaneous, QHS, Johnson, Clanford L, MD, 12 Units at 02/11/21 2213   ipratropium (ATROVENT) nebulizer solution 0.5 mg, 0.5 mg, Nebulization, Q6H PRN, Shahmehdi, Seyed A, MD   levalbuterol (XOPENEX) nebulizer solution 0.63 mg, 0.63 mg, Nebulization, Q6H PRN, Shahmehdi, Seyed A, MD   linaclotide (LINZESS) capsule 145 mcg, 145 mcg, Oral, QAC breakfast, Shahmehdi, Seyed A, MD, 145 mcg at 02/12/21 0831   ondansetron (ZOFRAN) tablet 4 mg, 4 mg, Oral, Q6H PRN **OR** ondansetron (ZOFRAN) injection 4 mg, 4 mg, Intravenous, Q6H PRN, Shahmehdi, Seyed A, MD, 4 mg at 02/07/21 2902   oxyCODONE (Oxy IR/ROXICODONE) immediate release tablet 5 mg, 5 mg, Oral, Q4H PRN, Shahmehdi, Seyed A, MD   pantoprazole (PROTONIX) EC tablet 40 mg, 40 mg, Oral, Daily, Akyia Borelli U, MD   potassium chloride SA (KLOR-CON) CR tablet 20 mEq, 20 mEq, Oral, BID, Johnson, Clanford L, MD   senna-docusate (Senokot-S) tablet 1 tablet, 1 tablet, Oral, QHS PRN, Shahmehdi, Seyed A, MD   sodium chloride flush (NS) 0.9 % injection 3 mL, 3 mL, Intravenous, Q12H, Shahmehdi, Seyed A, MD, 3 mL at 02/11/21 2215   sodium chloride flush (NS) 0.9 % injection 3 mL, 3 mL, Intravenous, Q12H, Shahmehdi, Seyed A, MD, 3 mL at 02/11/21 2215   sodium chloride flush (NS)  0.9 % injection 3 mL, 3 mL, Intravenous, PRN, Shahmehdi, Seyed A, MD   sucralfate (CARAFATE) tablet 1 g, 1 g, Oral, QID, Shahmehdi, Seyed A, MD, 1 g at 02/12/21 0831   torsemide (DEMADEX) tablet 20 mg, 20 mg, Oral, BID, Johnson, Clanford L, MD, 20 mg at 02/12/21 0831   traZODone (DESYREL) tablet 25 mg, 25 mg, Oral, QHS PRN, Shahmehdi, Seyed A, MD, 25 mg at 02/10/21 2130   Objective: Blood pressure (!) 106/56,  pulse (!) 59, temperature 98.5 F (36.9 C), resp. rate 18, height 6' (1.829 m), weight 97.8 kg, SpO2 100 %.  Conjunctiva is pink. Sclera is nonicteric Oropharyngeal mucosa is normal. No neck masses or thyromegaly noted. Cardiac exam with regular rhythm normal S1 and S2. No murmur or gallop noted. Lungs are clear to auscultation. Abdomen;  No LE edema or clubbing noted.  Labs/studies Results:   CBC Latest Ref Rng & Units 02/12/2021 02/11/2021 02/10/2021  WBC 4.0 - 10.5 K/uL 5.9 5.7 6.1  Hemoglobin 13.0 - 17.0 g/dL 8.5(L) 9.1(L) 9.0(L)  Hematocrit 39.0 - 52.0 % 27.6(L) 29.0(L) 28.9(L)  Platelets 150 - 400 K/uL 93(L) 119(L) 134(L)    CMP Latest Ref Rng & Units 02/12/2021 02/11/2021 02/10/2021  Glucose 70 - 99 mg/dL 103(H) 58(L) 67(L)  BUN 8 - 23 mg/dL 52(H) 58(H) 83(H)  Creatinine 0.61 - 1.24 mg/dL 1.56(H) 1.44(H) 1.84(H)  Sodium 135 - 145 mmol/L 133(L) 136 137  Potassium 3.5 - 5.1 mmol/L 4.1 3.7 3.5  Chloride 98 - 111 mmol/L 103 105 104  CO2 22 - 32 mmol/L _0 Calcium 8.9 - 10.3 mg/dL 7.9(L) 8.1(L) 8.3(L)  Total Protein 6.5 - 8.1 g/dL - - -  Total Bilirubin 0.3 - 1.2 mg/dL - - -  Alkaline Phos 38 - 126 U/L - - -  AST 15 - 41 U/L - - -  ALT 0 - 44 U/L - - -    Hepatic Function Latest Ref Rng & Units 02/08/2021 02/06/2021 01/26/2021  Total Protein 6.5 - 8.1 g/dL 5.9(L) 6.1(L) 6.7  Albumin 3.5 - 5.0 g/dL 3.0(L) 2.6(L) 2.8(L)  AST 15 - 41 U/L 31 27 35  ALT 0 - 44 U/L _1 Alk Phosphatase 38 - 126 U/L 121 176(H) 251(H)  Total Bilirubin 0.3 - 1.2 mg/dL 2.1(H) 2.3(H) 2.0(H)  Bilirubin, Direct 0.0 - 0.2 mg/dL 0.8(H) - -     Assessment:  #1.  Acute GI bleed.  He has received 4 units of PRBCs.  He has undergone 2 EGDs in small bowel given capsule in between.  Bleeding source not identified.  Last colonoscopy was in January 2019 and therefore we recommended colonoscopy be repeated. Patient is on low-dose aspirin.    #2.  Anemia secondary to GI bleed.  Hemoglobin has  dropped in the last 24 hours secondary to hemodilution.  No evidence of active bleeding.  #3.  Cirrhosis secondary to NASH.  #4.  Telemetry reported nonsustained V. tach.  Patient with known cardiomyopathy.  Dr. Wynetta Emery is aware.   Recommendations  PPI changed to oral route. Patient advised not to take p.o. iron for now. Will plan outpatient colonoscopy.  Office will contact patient. Patient will immediately return to emergency room if he has another episode of bleeding in which case we will proceed with GI bleeding scan as soon as possible.

## 2021-02-12 NOTE — TOC Transition Note (Signed)
Transition of Care [facility]) - CM/SW Discharge Note   Patient Details  Name: John [name] MRN: [id number] Date of Birth: 12-01-1945  [facility]) CM/SW Contact:  Natasha Bence, LCSW Phone Number: 02/12/2021, 11:34 AM   Clinical Narrative:    CSW notified of patient's readiness for discharge. CSW notified Georgina Snell with [name]. Georgina Snell agreeable to provide services upon discharge. TOC signing off.   Final next level of care: [place] Barriers to Discharge: Barriers Resolved   Patient Goals and CMS Choice Patient states their goals for this hospitalization and ongoing recovery are:: Return home with [facility] CMS Medicare.gov Compare Post Acute Care list provided to:: Patient Choice offered to / list presented to : Patient  Discharge Placement                    Patient and family notified of of transfer: 02/12/21  Discharge Plan and Services In-house Referral: Clinical Social Work Discharge Planning Services: CM Consult Post Acute Care Choice: Home Health                    HH Arranged: RN, PT [facility] Agency: [place] Date [facility] Agency Contacted: 02/12/21 Time [place]: 1134 Representative spoke with at [place]: [place] ([place]) Interventions     Readmission Risk Interventions Readmission Risk Prevention Plan 02/09/2021 10/18/2020 11/14/2018  Transportation Screening Complete Complete Complete  PCP or Specialist Appt within 3-5 Days - - Complete  HRI or [place] - - Complete  Social Work Consult for [place] Planning/Counseling - - Complete  Palliative Care Screening - - Not Applicable  Medication Review Press photographer) Complete Complete Complete  HRI or Home Care Consult Complete Complete -  SW Recovery Care/Counseling Consult Complete Complete -  Palliative Care Screening Not Applicable Not Applicable -  [place] Not Applicable Not Applicable -  Some recent  data might be hidden

## 2021-02-12 NOTE — Discharge Instructions (Signed)
IMPORTANT INFORMATION: PAY CLOSE ATTENTION   PHYSICIAN DISCHARGE INSTRUCTIONS  Follow with Primary care provider  [name], MD  and other consultants as instructed by your Hospitalist Physician  [place] IF SYMPTOMS COME BACK, WORSEN OR NEW PROBLEM DEVELOPS   Please note: You were cared for by a hospitalist during your hospital stay. Every effort will be made to forward records to your primary care provider.  You can request that your primary care provider send for your hospital records if they have not received them.  Once you are discharged, your primary care physician will handle any further medical issues. Please note that NO REFILLS for any discharge medications will be authorized once you are discharged, as it is imperative that you return to your primary care physician (or establish a relationship with a primary care physician if you do not have one) for your post hospital discharge needs so that they can reassess your need for medications and monitor your lab values.  Please get a complete blood count and chemistry panel checked by your Primary MD at your next visit, and again as instructed by your Primary MD.  Get Medicines reviewed and adjusted: Please take all your medications with you for your next visit with your Primary MD  Laboratory/radiological data: Please request your Primary MD to go over all hospital tests and procedure/radiological results at the follow up, please ask your primary care provider to get all Hospital records sent to his/her office.  In some cases, they will be blood work, cultures and biopsy results pending at the time of your discharge. Please request that your primary care provider follow up on these results.  If you are diabetic, please bring your blood sugar readings with you to your follow up appointment with primary care.    Please call and make your follow up appointments as soon as possible.    Also Note the  following: If you experience worsening of your admission symptoms, develop shortness of breath, life threatening emergency, suicidal or homicidal thoughts you must seek medical attention immediately by calling 911 or calling your MD immediately  if symptoms less severe.  You must read complete instructions/literature along with all the possible adverse reactions/side effects for all the Medicines you take and that have been prescribed to you. Take any new Medicines after you have completely understood and accpet all the possible adverse reactions/side effects.   Do not drive when taking Pain medications or sleeping medications (Benzodiazepines)  Do not take more than prescribed Pain, Sleep and Anxiety Medications. It is not advisable to combine anxiety,sleep and pain medications without talking with your primary care practitioner  Special Instructions: If you have smoked or chewed Tobacco  in the last 2 yrs please stop smoking, stop any regular Alcohol  and or any Recreational drug use.  Wear Seat belts while driving.  Do not drive if taking any narcotic, mind altering or controlled substances or recreational drugs or alcohol.

## 2021-02-12 NOTE — Discharge Summary (Addendum)
Physician Discharge Summary  John [name] BZJ:[id number] DOB: Sep 25, 1945 DOA: 02/06/2021  PCP: [name], MD Cardiology: Dr Haroldine Laws GI: Dr. Laural Golden   Admit date: 02/06/2021 Discharge date: 02/12/2021  Admitted From:  HOME  Disposition: HOME   Recommendations for Outpatient Follow-up:  Follow up with PCP in 1 weeks Follow up with GI as scheduled for outpatient colonoscopy  Follow up with cardiology in 3 weeks  Please obtain BMP/CBC in 1-2 weeks to follow up Hg and electrolytes   Discharge Condition: STABLE   CODE STATUS: FULL  DIET: heart healthy carb modified    Brief Hospitalization Summary: Please see all hospital notes, images, labs for full details of the hospitalization. ADMISSION HPI:  75 y.o. male with medical history significant of systolic and diastolic CHF, DM 2, HTN, NASH cirrhosis, CKD stage IIIb, heart block status post PPM, HLD, persistent A. fib, off chronic anticoagulation due to GI bleed, upper GI bleed-ulcer, diverticulitis.  Presented today with chief complaint of abdominal, black stool, 4 episodes this morning.  Some shortness of breath, when laying down, edema bilateral lower extremity. On Zaroxolyn q. Mondays and torsemide twice a day.   Recent hospitalization from 11/3/ - 11/6  for GI bleed, diagnosed with diverticulitis, duodenal ulcer   HOSPITAL COURSE BY PROBLEM LIST   Acute GI bleed  - s/p 4 units PRBC - Hg stable around 8  - octreotide taper completed  - follow up GI recommendations for outpatient colonoscopy  - per GI if another episode of GI bleed would plan on GI bleeding scan, he would need to return to ED for emergent treatment. He verbalized understanding  - discussed with Dr. Laural Golden, ok for patient to restart low dose aspirin.  He is arranging for outpatient GI follow up and is agreeable to patient discharging home today.     NICM - resume home carvedilol and torsemide, spironolactone, potassium supplement   Type 2 DM with renal  complications, uncontrolled as evidenced by A1c 9.2%  - reduced basal insulin to 12 units due to low BS - Monitor CBG 5x per day.   Chronic atrial fibrillation - history of TAVR 2/22 - continue carvedilol 3.125 mg BID - no anticoagulation in setting of GI bleed    Hyponatremia  - stable, following   Diverticulosis  - stable currently    DVT prophylaxis: SCDs Code Status: Full  Family Communication: wife at bedside  Disposition: home Status is: Inpatient  Discharge Diagnoses:  Principal Problem:   Acute GI bleeding Active Problems:   DM type 2 causing vascular disease ([place])   Mixed hyperlipidemia   Essential hypertension, benign   Atrial flutter (HCC)   CKD (chronic kidney disease) stage 3, GFR 30-59 ml/min (HCC)   Paroxysmal atrial fibrillation (HCC)   Hyponatremia   Acute on chronic combined systolic and diastolic CHF (congestive heart failure) (HCC)   Severe aortic stenosis   S/P TAVR (transcatheter aortic valve replacement)   Anemia   Melena   Cirrhosis ([place])   Diverticulitis of colon   Discharge Instructions:  Allergies as of 02/12/2021   No Known Allergies      Medication List     STOP taking these medications    cefdinir 300 MG capsule Commonly known as: OMNICEF   ferrous sulfate 325 (65 FE) MG tablet   metroNIDAZOLE 500 MG tablet Commonly known as: FLAGYL   polyethylene glycol-electrolytes 420 g solution Commonly known as: TriLyte   sucralfate 1 g tablet Commonly known as: Carafate  TAKE these medications    Accu-Chek Aviva Plus test strip Generic drug: glucose blood TEST BLOOD SUGAR TWICE DAILY BEFORE BREAKFAST AND AT BEDTIME   acetaminophen 325 MG tablet Commonly known as: TYLENOL Take 2 tablets (650 mg total) by mouth every 4 (four) hours as needed for headache or mild pain.   allopurinol 300 MG tablet Commonly known as: ZYLOPRIM Take 150 mg by mouth as needed.   Anti-Itch lotion Generic drug: camphor-menthol Apply 1  application topically daily as needed for itching.   aspirin EC 81 MG tablet Take 1 tablet (81 mg total) by mouth daily with breakfast.   BD Pen Needle Nano U/F 32G X 4 MM Misc Generic drug: Insulin Pen Needle 1 each by Does not apply route 4 (four) times daily.   benzonatate 100 MG capsule Commonly known as: TESSALON Take 1 capsule (100 mg total) by mouth 3 (three) times daily as needed for cough.   carvedilol 3.125 MG tablet Commonly known as: COREG Take 1 tablet (3.125 mg total) by mouth 2 (two) times daily with a meal.   colchicine 0.6 MG tablet Take 1 tablet (0.6 mg total) by mouth daily as needed.   hydrocortisone cream 1 % Apply 1 application topically daily.   linaclotide 145 MCG Caps capsule Commonly known as: Linzess Take 1 capsule (145 mcg total) by mouth daily before breakfast.   metolazone 2.5 MG tablet Commonly known as: ZAROXOLYN Take 1 tablet (2.5 mg total) by mouth once a week. Every Monday Start taking on: February 20, 2021 What changed: These instructions start on February 20, 2021. If you are unsure what to do until then, ask your doctor or other care provider.   One-A-Day Mens 50+ Tabs Take 1 tablet by mouth daily with breakfast. Men   pantoprazole 40 MG tablet Commonly known as: PROTONIX Take 1 tablet (40 mg total) by mouth daily. What changed: when to take this   potassium chloride SA 20 MEQ tablet Commonly known as: Klor-Con M20 Take 1.5 tablets (30 mEq total) by mouth 2 (two) times daily. Take 2 extra tabs on Mon with metolazone   spironolactone 25 MG tablet Commonly known as: ALDACTONE Take 0.5 tablets (12.5 mg total) by mouth daily. Start taking on: February 13, 2021   torsemide 20 MG tablet Commonly known as: DEMADEX Take 1 tablet (20 mg total) by mouth 2 (two) times daily.   Tyler Aas FlexTouch 100 UNIT/ML FlexTouch Pen Generic drug: insulin degludec Inject 12 Units into the skin at bedtime. Start taking on: February 13, 2021 What  changed: how much to take   Trulicity 1.5 GY/6.9SW Sopn Generic drug: Dulaglutide INJECT 1.5MG (1 PEN) SUBCUTANEOUSLY EVERY WEEK        Follow-up Information     [name], MD. Schedule an appointment as soon as possible for a visit in 1 week(s).   Specialty: Internal Medicine Contact information: [facility] [address] [phone]         Arnoldo Lenis, MD .   Specialty: Cardiology Contact information: [place] [address] [phone]         Bensimhon, Shaune Pascal, MD .   Specialty: Cardiology Contact information: [place] [address] [phone]         Evans Lance, MD .   Specialty: Cardiology Contact information: [place] [address] [phone]         Rogene Houston,  MD. Schedule an appointment as soon as possible for a visit in 3 week(s).   Specialty: Gastroenterology Why: Hospital Follow Up Contact information: [place] [address] [phone]                No Known Allergies Allergies as of 02/12/2021   No Known Allergies      Medication List     STOP taking these medications    cefdinir 300 MG capsule Commonly known as: OMNICEF   ferrous sulfate 325 (65 FE) MG tablet   metroNIDAZOLE 500 MG tablet Commonly known as: FLAGYL   polyethylene glycol-electrolytes 420 g solution Commonly known as: TriLyte   sucralfate 1 g tablet Commonly known as: Carafate       TAKE these medications    Accu-Chek Aviva Plus test strip Generic drug: glucose blood TEST BLOOD SUGAR TWICE DAILY BEFORE BREAKFAST AND AT BEDTIME   acetaminophen 325 MG tablet Commonly known as: TYLENOL Take 2 tablets (650 mg total) by mouth every 4 (four) hours as needed for headache or mild pain.   allopurinol 300 MG tablet Commonly known as: ZYLOPRIM Take 150 mg by mouth as needed.   Anti-Itch lotion Generic  drug: camphor-menthol Apply 1 application topically daily as needed for itching.   aspirin EC 81 MG tablet Take 1 tablet (81 mg total) by mouth daily with breakfast.   BD Pen Needle Nano U/F 32G X 4 MM Misc Generic drug: Insulin Pen Needle 1 each by Does not apply route 4 (four) times daily.   benzonatate 100 MG capsule Commonly known as: TESSALON Take 1 capsule (100 mg total) by mouth 3 (three) times daily as needed for cough.   carvedilol 3.125 MG tablet Commonly known as: COREG Take 1 tablet (3.125 mg total) by mouth 2 (two) times daily with a meal.   colchicine 0.6 MG tablet Take 1 tablet (0.6 mg total) by mouth daily as needed.   hydrocortisone cream 1 % Apply 1 application topically daily.   linaclotide 145 MCG Caps capsule Commonly known as: Linzess Take 1 capsule (145 mcg total) by mouth daily before breakfast.   metolazone 2.5 MG tablet Commonly known as: ZAROXOLYN Take 1 tablet (2.5 mg total) by mouth once a week. Every Monday Start taking on: February 20, 2021 What changed: These instructions start on February 20, 2021. If you are unsure what to do until then, ask your doctor or other care provider.   One-A-Day Mens 50+ Tabs Take 1 tablet by mouth daily with breakfast. Men   pantoprazole 40 MG tablet Commonly known as: PROTONIX Take 1 tablet (40 mg total) by mouth daily. What changed: when to take this   potassium chloride SA 20 MEQ tablet Commonly known as: Klor-Con M20 Take 1.5 tablets (30 mEq total) by mouth 2 (two) times daily. Take 2 extra tabs on Mon with metolazone   spironolactone 25 MG tablet Commonly known as: ALDACTONE Take 0.5 tablets (12.5 mg total) by mouth daily. Start taking on: February 13, 2021   torsemide 20 MG tablet Commonly known as: DEMADEX Take 1 tablet (20 mg total) by mouth 2 (two) times daily.   Tyler Aas FlexTouch 100 UNIT/ML FlexTouch Pen Generic drug: insulin degludec Inject 12 Units into the skin at bedtime. Start  taking on: February 13, 2021 What changed: how much to take   Trulicity 1.5 DS/2.8JG Sopn Generic drug: Dulaglutide INJECT 1.5MG (1 PEN) SUBCUTANEOUSLY EVERY WEEK        Procedures/Studies: CT  ABDOMEN PELVIS WO CONTRAST  Result Date: 01/26/2021 CLINICAL DATA:  Acute abdominal pain in the LEFT lower quadrant with nausea and vomiting that began this morning in a 75 year old male EXAM: CT ABDOMEN AND PELVIS WITHOUT CONTRAST TECHNIQUE: Multidetector CT imaging of the abdomen and pelvis was performed following the standard protocol without IV contrast. COMPARISON:  April 30, 2020. FINDINGS: Lower chest: Signs of trans arterial aortic valve replacement seen on scout images. Pacer device in place, incompletely evaluated, lead in the RIGHT heart as before. Small RIGHT-sided pleural effusion is diminished from more remote imaging. Mild septal thickening the lung bases without dense consolidative process. Lung base assessment mildly limited due to respiratory motion. Hepatobiliary: Nodular hepatic contours compatible with cirrhotic morphologic changes. Cholelithiasis, contracted gallbladder about gallstones. No gross lesion on noncontrast imaging. Pancreas: No stranding about the pancreas to the extent evaluated due to streak artifact from gastroduodenal artery coiling. Pancreatic atrophy as before. Spleen: Normal. Adrenals/Urinary Tract: Adrenal glands are normal. Smooth contour the bilateral kidneys. Chronic perinephric stranding is unchanged. Venous collateral pathways about the RIGHT urinary bladder similarly unchanged. No substantial perivesical stranding, nephrolithiasis, ureteral calculi or hydronephrosis. Stomach/Bowel: Sigmoid diverticular disease with question of mild stranding near the descending/sigmoid junction. No abscess or free air. Normal appendix. Stomach is distended without signs of adjacent stranding. Small bowel is nondilated. Ovoid low-density structure adjacent to the third portion of  the duodenum measures 4.4 x 3.2 cm previously 5.1 x 3.6 cm and December of 2021 and not substantially changed compared to imaging from February of 2022. Vascular/Lymphatic: Aortic atherosclerosis. No sign of aneurysm. Smooth contour of the IVC. There is no gastrohepatic or hepatoduodenal ligament lymphadenopathy. No retroperitoneal or mesenteric lymphadenopathy. No pelvic sidewall lymphadenopathy. Limited assessment of vascular structures without intravenous contrast on today's study. Reproductive: Unremarkable aside from adjacent collateral vessels near the urinary bladder and prostate gland, not well assessed given lack of contrast. Other: Moderate LEFT small RIGHT inguinal hernias containing fat. Musculoskeletal: No acute bone finding. No destructive bone process. Spinal degenerative changes. Degenerative changes are marked and worse at the L3-4 level. IMPRESSION: Suspect mild acute uncomplicated sigmoid diverticulitis at the descending/sigmoid junction. Pancolonic diverticulosis. Hepatic cirrhosis. Small RIGHT-sided pleural effusion is diminished from more remote imaging. 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 Cholelithiasis, contracted gallbladder about gallstones. Aortic Atherosclerosis (ICD10-I70.0). Electronically Signed   By: Zetta Bills M.D.   On: 01/26/2021 17:18   DG Abd 1 View  Result Date: 02/10/2021 CLINICAL DATA:  Video capsule location EXAM: ABDOMEN - 1 VIEW COMPARISON:  CT abdomen pelvis 01/26/2021 FINDINGS: Normal bowel gas pattern. No obstruction or ileus. No retained video capsule identified in the abdomen. Embolization coils overlying the duodenum. Dual lead pacemaker.  TAVR. IMPRESSION: Normal bowel gas pattern. No video capsule device identified in the abdomen Electronically Signed   By: Franchot Gallo M.D.   On: 02/10/2021 15:13   CUP PACEART REMOTE DEVICE  CHECK  Result Date: 01/15/2021 Scheduled remote reviewed. Normal device function.  AF burden 100%. Ave V response 60's-70's bpm. No OAC due to bleed. Next remote 91 days. LH  US LIVER DOPPLER  Result Date: 02/09/2021 CLINICAL DATA:  Cirrhosis.  Assess portal vein patency. EXAM: DUPLEX ULTRASOUND OF LIVER TECHNIQUE: Color and duplex Doppler ultrasound was performed to evaluate the hepatic in-flow and out-flow vessels. COMPARISON:  RIGHT quadrant ultrasound 09/16/2020. Ultrasound elastography, 09/16/2020. CT AP, 01/26/2021. FINDINGS: Liver: Increased hepatic  parenchymal echogenicity. Grossly nodular liver contour. No focal lesion, mass or intrahepatic biliary ductal dilatation. Main Portal Vein size: 0.8 cm Portal Vein Velocities Main Prox:  19 cm/sec Main Mid: 16 cm/sec Main Dist:  19 cm/sec Right: 26 cm/sec Left: 18 cm/sec Hepatic Vein Velocities Right:  55 cm/sec Middle:  49 cm/sec Left:  72 cm/sec IVC: Present and patent with normal respiratory phasicity. Hepatic Artery Velocity:  54 cm/sec Splenic Vein Velocity:  35 cm/sec Spleen: 5.4 x 4 7 x 12.4 cm with a total volume of 163 cm^3 (411 cm^3 is upper limit normal) Portal Vein Occlusion/Thrombus: No Splenic Vein Occlusion/Thrombus: No Ascites: None Varices: None IMPRESSION: 1. Cirrhotic morphology liver.  No sonographic evidence of hepatoma. 2. Patent portal and splenic veins. Michaelle Birks, MD [facility] Radiology Electronically Signed   By: Michaelle Birks M.D.   On: 02/09/2021 17:32     Subjective: Pt reports feeling much better today.  No SOB.  No chest pain and no further bleeding noted.    Discharge Exam: Vitals:   02/11/21 2109 02/12/21 0613  BP: 122/64 (!) 106/56  Pulse: 60 (!) 59  Resp: 16 18  Temp: 98.1 F (36.7 C) 98.5 F (36.9 C)  SpO2: 100% 100%   Vitals:   02/11/21 1839 02/11/21 2109 02/12/21 0500 02/12/21 0613  BP: (!) 109/52 122/64  (!) 106/56  Pulse: 72 60  (!) 59  Resp:  16   18  Temp:  98.1 F (36.7 C)  98.5 F (36.9 C)  TempSrc:  Oral    SpO2:  100%  100%  Weight:   97.8 kg   Height:       General: Pt is alert, awake, not in acute distress Cardiovascular: normal S1/S2 +, no rubs, no gallops Respiratory: CTA bilaterally, no wheezing, no rhonchi, no crackles.  Abdominal: Soft, NT, ND, bowel sounds + Extremities: no edema, no cyanosis   The results of significant diagnostics from this hospitalization (including imaging, microbiology, ancillary and laboratory) are listed below for reference.     Microbiology: Recent Results (from the past 240 hour(s))  Resp Panel by RT-PCR (Flu A&B, Covid) Nasopharyngeal Swab     Status: None   Collection Time: 02/06/21  1:51 PM   Specimen: Nasopharyngeal Swab; Nasopharyngeal(NP) swabs in vial transport medium  Result Value Ref Range Status   SARS Coronavirus 2 by RT PCR NEGATIVE NEGATIVE Final    Comment: (NOTE) SARS-CoV-2 target nucleic acids are NOT DETECTED.  The SARS-CoV-2 RNA is generally detectable in upper respiratory specimens during the acute phase of infection. The lowest concentration of SARS-CoV-2 viral copies this assay can detect is 138 copies/mL. A negative result does not preclude SARS-Cov-2 infection and should not be used as the sole basis for treatment or other patient management decisions. A negative result may occur with  improper specimen collection/handling, submission of specimen other than nasopharyngeal swab, presence of viral mutation(s) within the areas targeted by this assay, and inadequate number of viral copies(<138 copies/mL). A negative result must be combined with clinical observations, patient history, and epidemiological information. The expected result is Negative.  Fact Sheet for Patients:  EntrepreneurPulse.com.au  Fact Sheet for Healthcare Providers:  IncredibleEmployment.be  This test is no t yet approved or cleared by the [place] FDA and  has been authorized for detection and/or diagnosis of SARS-CoV-2 by FDA under an Emergency Use Authorization (EUA). This EUA will remain  in effect (meaning this test can be used) for the  duration of the COVID-19 declaration under Section 564(b)(1) of the Act, 21 U.S.C.section 360bbb-3(b)(1), unless the authorization is terminated  or revoked sooner.       Influenza A by PCR NEGATIVE NEGATIVE Final   Influenza B by PCR NEGATIVE NEGATIVE Final    Comment: (NOTE) The Xpert Xpress SARS-CoV-2/FLU/RSV plus assay is intended as an aid in the diagnosis of influenza from Nasopharyngeal swab specimens and should not be used as a sole basis for treatment. Nasal washings and aspirates are unacceptable for Xpert Xpress SARS-CoV-2/FLU/RSV testing.  Fact Sheet for Patients: EntrepreneurPulse.com.au  Fact Sheet for Healthcare Providers: IncredibleEmployment.be  This test is not yet approved or cleared by the [place] FDA and has been authorized for detection and/or diagnosis of SARS-CoV-2 by FDA under an Emergency Use Authorization (EUA). This EUA will remain in effect (meaning this test can be used) for the duration of the COVID-19 declaration under Section 564(b)(1) of the Act, 21 U.S.C. section 360bbb-3(b)(1), unless the authorization is terminated or revoked.  Performed at [facility], [address] [place], [place] [address]   MRSA Next Gen by PCR, Nasal     Status: None   Collection Time: 02/06/21  8:20 PM   Specimen: Nasal Mucosa; Nasal Swab  Result Value Ref Range Status   MRSA by PCR Next Gen NOT DETECTED NOT DETECTED Final    Comment: (NOTE) The GeneXpert MRSA Assay (FDA approved for NASAL specimens only), is one component of a comprehensive MRSA colonization surveillance program. It is not intended to diagnose MRSA infection nor to guide or monitor treatment for MRSA infections. Test performance is not FDA approved in  patients less than 20 years old. Performed at [facility], [address] [place], [place] [address]      Labs: BNP (last 3 results) Recent Labs    12/30/20 1534 01/09/21 1440 02/06/21 1300  BNP 1,430.2* 1,835.6* 5,732.2*   Basic Metabolic Panel: Recent Labs  Lab 02/08/21 0430 02/08/21 1407 02/09/21 0433 02/10/21 0411 02/11/21 0518 02/12/21 0755  NA 133*  --  134* 137 136 133*  K 3.3*  --  3.9 3.5 3.7 4.1  CL 100  --  102 104 105 103  CO2 25  --  23 24 25 23   GLUCOSE 89  --  89 67* 58* 103*  BUN 108*  --  102* 83* 58* 52*  CREATININE 2.14*  --  2.16* 1.84* 1.44* 1.56*  CALCIUM 8.3*  --  8.5* 8.3* 8.1* 7.9*  MG  --  2.1  --   --   --   --    Liver Function Tests: Recent Labs  Lab 02/06/21 1300 02/08/21 1407  AST 27 31  ALT 16 15  ALKPHOS 176* 121  BILITOT 2.3* 2.1*  PROT 6.1* 5.9*  ALBUMIN 2.6* 3.0*   No results for input(s): LIPASE, AMYLASE in the last 168 hours. No results for input(s): AMMONIA in the last 168 hours. CBC: Recent Labs  Lab 02/06/21 1300 02/07/21 0957 02/08/21 0430 02/08/21 1407 02/09/21 0433 02/10/21 0411 02/11/21 0518 02/12/21 0351  WBC 8.1   < > 8.5  --  8.0 6.1 5.7 5.9  NEUTROABS 5.7  --   --   --   --   --   --   --   HGB 6.4*   < > 6.7* 8.4* 8.8* 9.0* 9.1* 8.5*  HCT 21.6*   < > 20.8* 25.2* 27.5* 28.9* 29.0* 27.6*  MCV 87.1   < > 84.6  --  85.9 90.6 90.6 89.9  PLT 176   < > 139*  --  144* 134* 119* 93*   < > = values in this interval not displayed.   Cardiac Enzymes: No results for input(s): CKTOTAL, CKMB, CKMBINDEX, TROPONINI in the last 168 hours. BNP: Invalid input(s): POCBNP CBG: Recent Labs  Lab 02/11/21 2110 02/12/21 0001 02/12/21 0304 02/12/21 0520 02/12/21 0730  GLUCAP 192* 131* 78 111* 101*   D-Dimer No results for input(s): DDIMER in the last 72 hours. Hgb A1c No results for input(s): HGBA1C in the last 72 hours. Lipid Profile No results for input(s): CHOL, HDL, LDLCALC, TRIG, CHOLHDL, LDLDIRECT in  the last 72 hours. Thyroid function studies No results for input(s): TSH, T4TOTAL, T3FREE, THYROIDAB in the last 72 hours.  Invalid input(s): FREET3 Anemia work up No results for input(s): VITAMINB12, FOLATE, FERRITIN, TIBC, IRON, RETICCTPCT in the last 72 hours. Urinalysis    Component Value Date/Time   COLORURINE STRAW (A) 01/26/2021 1328   APPEARANCEUR CLEAR 01/26/2021 1328   LABSPEC 1.008 01/26/2021 1328   PHURINE 6.0 01/26/2021 1328   GLUCOSEU NEGATIVE 01/26/2021 1328   HGBUR NEGATIVE 01/26/2021 1328   BILIRUBINUR NEGATIVE 01/26/2021 1328   KETONESUR NEGATIVE 01/26/2021 1328   PROTEINUR NEGATIVE 01/26/2021 1328   UROBILINOGEN 0.2 01/05/2014 0744   NITRITE NEGATIVE 01/26/2021 1328   LEUKOCYTESUR NEGATIVE 01/26/2021 1328   Sepsis Labs Invalid input(s): PROCALCITONIN,  WBC,  LACTICIDVEN Microbiology Recent Results (from the past 240 hour(s))  Resp Panel by RT-PCR (Flu A&B, Covid) Nasopharyngeal Swab     Status: None   Collection Time: 02/06/21  1:51 PM   Specimen: Nasopharyngeal Swab; Nasopharyngeal(NP) swabs in vial transport medium  Result Value Ref Range Status   SARS Coronavirus 2 by RT PCR NEGATIVE NEGATIVE Final    Comment: (NOTE) SARS-CoV-2 target nucleic acids are NOT DETECTED.  The SARS-CoV-2 RNA is generally detectable in upper respiratory specimens during the acute phase of infection. The lowest concentration of SARS-CoV-2 viral copies this assay can detect is 138 copies/mL. A negative result does not preclude SARS-Cov-2 infection and should not be used as the sole basis for treatment or other patient management decisions. A negative result may occur with  improper specimen collection/handling, submission of specimen other than nasopharyngeal swab, presence of viral mutation(s) within the areas targeted by this assay, and inadequate number of viral copies(<138 copies/mL). A negative result must be combined with clinical observations, patient history, and  epidemiological information. The expected result is Negative.  Fact Sheet for Patients:  EntrepreneurPulse.com.au  Fact Sheet for Healthcare Providers:  IncredibleEmployment.be  This test is no t yet approved or cleared by the [place] FDA and  has been authorized for detection and/or diagnosis of SARS-CoV-2 by FDA under an Emergency Use Authorization (EUA). This EUA will remain  in effect (meaning this test can be used) for the duration of the COVID-19 declaration under Section 564(b)(1) of the Act, 21 U.S.C.section 360bbb-3(b)(1), unless the authorization is terminated  or revoked sooner.       Influenza A by PCR NEGATIVE NEGATIVE Final   Influenza B by PCR NEGATIVE NEGATIVE Final    Comment: (NOTE) The Xpert Xpress SARS-CoV-2/FLU/RSV plus assay is intended as an aid in the diagnosis of influenza from Nasopharyngeal swab specimens and should not be used as a sole basis for treatment. Nasal washings and aspirates are unacceptable for Xpert Xpress SARS-CoV-2/FLU/RSV testing.  Fact Sheet for Patients: EntrepreneurPulse.com.au  Fact Sheet for Healthcare Providers: IncredibleEmployment.be  This test is  not yet approved or cleared by the [place] and has been authorized for detection and/or diagnosis of SARS-CoV-2 by FDA under an Emergency Use Authorization (EUA). This EUA will remain in effect (meaning this test can be used) for the duration of the COVID-19 declaration under Section 564(b)(1) of the Act, 21 U.S.C. section 360bbb-3(b)(1), unless the authorization is terminated or revoked.  Performed at [facility], [address] [place], [place] [address]   MRSA Next Gen by PCR, Nasal     Status: None   Collection Time: 02/06/21  8:20 PM   Specimen: Nasal Mucosa; Nasal Swab  Result Value Ref Range Status   MRSA by PCR Next Gen NOT DETECTED NOT DETECTED Final    Comment: (NOTE) The  GeneXpert MRSA Assay (FDA approved for NASAL specimens only), is one component of a comprehensive MRSA colonization surveillance program. It is not intended to diagnose MRSA infection nor to guide or monitor treatment for MRSA infections. Test performance is not FDA approved in patients less than 79 years old. Performed at [facility], [address], [place],  [address]    Time coordinating discharge: 38 minutes   SIGNED:  Irwin Brakeman, MD  [facility] 02/12/2021, 10:03 AM How to contact the [facility] Attending or Consulting provider [place] or covering provider during after hours [place], for this patient?  Check the care team in [facility] and look for a) attending/consulting [facility] provider listed and b) the [facility] team listed Log into [email or web address] and use [place]'s universal password to access. If you do not have the password, please contact the hospital operator. Locate the [facility] provider you are looking for under [facility] and page to a number that you can be directly reached. If you still have difficulty reaching the provider, please page the [facility] (Director on Call) for the Hospitalists listed on amion for assistance.

## 2021-02-13 ENCOUNTER — Other Ambulatory Visit (INDEPENDENT_AMBULATORY_CARE_PROVIDER_SITE_OTHER): Payer: Self-pay

## 2021-02-13 ENCOUNTER — Encounter (INDEPENDENT_AMBULATORY_CARE_PROVIDER_SITE_OTHER): Payer: Self-pay

## 2021-02-13 DIAGNOSIS — K922 Gastrointestinal hemorrhage, unspecified: Secondary | ICD-10-CM

## 2021-02-14 ENCOUNTER — Ambulatory Visit (HOSPITAL_COMMUNITY): Admit: 2021-02-14 | Payer: Medicare HMO | Admitting: Gastroenterology

## 2021-02-14 ENCOUNTER — Encounter (HOSPITAL_COMMUNITY): Payer: Self-pay

## 2021-02-14 SURGERY — COLONOSCOPY WITH PROPOFOL
Anesthesia: Monitor Anesthesia Care

## 2021-02-20 DIAGNOSIS — Z79899 Other long term (current) drug therapy: Secondary | ICD-10-CM | POA: Diagnosis not present

## 2021-02-20 DIAGNOSIS — R809 Proteinuria, unspecified: Secondary | ICD-10-CM | POA: Diagnosis not present

## 2021-02-20 DIAGNOSIS — D649 Anemia, unspecified: Secondary | ICD-10-CM | POA: Diagnosis not present

## 2021-02-20 DIAGNOSIS — N189 Chronic kidney disease, unspecified: Secondary | ICD-10-CM | POA: Diagnosis not present

## 2021-02-20 DIAGNOSIS — E559 Vitamin D deficiency, unspecified: Secondary | ICD-10-CM | POA: Diagnosis not present

## 2021-02-20 DIAGNOSIS — I129 Hypertensive chronic kidney disease with stage 1 through stage 4 chronic kidney disease, or unspecified chronic kidney disease: Secondary | ICD-10-CM | POA: Diagnosis not present

## 2021-02-20 DIAGNOSIS — I5022 Chronic systolic (congestive) heart failure: Secondary | ICD-10-CM | POA: Diagnosis not present

## 2021-02-20 DIAGNOSIS — E1129 Type 2 diabetes mellitus with other diabetic kidney complication: Secondary | ICD-10-CM | POA: Diagnosis not present

## 2021-02-20 DIAGNOSIS — E1122 Type 2 diabetes mellitus with diabetic chronic kidney disease: Secondary | ICD-10-CM | POA: Diagnosis not present

## 2021-02-21 ENCOUNTER — Ambulatory Visit (HOSPITAL_COMMUNITY)
Admission: RE | Admit: 2021-02-21 | Discharge: 2021-02-21 | Disposition: A | Discharge status: Home / Self Care | Payer: Medicare HMO | Source: Ambulatory Visit | Attending: Cardiology | Admitting: Cardiology

## 2021-02-21 ENCOUNTER — Encounter (HOSPITAL_COMMUNITY): Payer: Self-pay

## 2021-02-21 VITALS — BP 142/80 | HR 77 | Wt 218.8 lb

## 2021-02-21 DIAGNOSIS — I428 Other cardiomyopathies: Secondary | ICD-10-CM | POA: Insufficient documentation

## 2021-02-21 DIAGNOSIS — E119 Type 2 diabetes mellitus without complications: Secondary | ICD-10-CM | POA: Diagnosis not present

## 2021-02-21 DIAGNOSIS — I4819 Other persistent atrial fibrillation: Secondary | ICD-10-CM | POA: Diagnosis not present

## 2021-02-21 DIAGNOSIS — I5042 Chronic combined systolic (congestive) and diastolic (congestive) heart failure: Secondary | ICD-10-CM | POA: Diagnosis not present

## 2021-02-21 DIAGNOSIS — Z952 Presence of prosthetic heart valve: Secondary | ICD-10-CM | POA: Diagnosis not present

## 2021-02-21 DIAGNOSIS — I5022 Chronic systolic (congestive) heart failure: Secondary | ICD-10-CM

## 2021-02-21 DIAGNOSIS — Z794 Long term (current) use of insulin: Secondary | ICD-10-CM

## 2021-02-21 DIAGNOSIS — I130 Hypertensive heart and chronic kidney disease with heart failure and stage 1 through stage 4 chronic kidney disease, or unspecified chronic kidney disease: Secondary | ICD-10-CM | POA: Insufficient documentation

## 2021-02-21 DIAGNOSIS — I4892 Unspecified atrial flutter: Secondary | ICD-10-CM | POA: Insufficient documentation

## 2021-02-21 DIAGNOSIS — Z7982 Long term (current) use of aspirin: Secondary | ICD-10-CM | POA: Diagnosis not present

## 2021-02-21 DIAGNOSIS — I080 Rheumatic disorders of both mitral and aortic valves: Secondary | ICD-10-CM | POA: Insufficient documentation

## 2021-02-21 DIAGNOSIS — Z79899 Other long term (current) drug therapy: Secondary | ICD-10-CM | POA: Diagnosis not present

## 2021-02-21 DIAGNOSIS — K766 Portal hypertension: Secondary | ICD-10-CM | POA: Diagnosis not present

## 2021-02-21 DIAGNOSIS — N1832 Chronic kidney disease, stage 3b: Secondary | ICD-10-CM | POA: Diagnosis not present

## 2021-02-21 DIAGNOSIS — I340 Nonrheumatic mitral (valve) insufficiency: Secondary | ICD-10-CM | POA: Diagnosis not present

## 2021-02-21 DIAGNOSIS — I350 Nonrheumatic aortic (valve) stenosis: Secondary | ICD-10-CM | POA: Diagnosis not present

## 2021-02-21 DIAGNOSIS — I482 Chronic atrial fibrillation, unspecified: Secondary | ICD-10-CM | POA: Diagnosis not present

## 2021-02-21 DIAGNOSIS — Z8719 Personal history of other diseases of the digestive system: Secondary | ICD-10-CM | POA: Diagnosis not present

## 2021-02-21 DIAGNOSIS — E1122 Type 2 diabetes mellitus with diabetic chronic kidney disease: Secondary | ICD-10-CM | POA: Diagnosis not present

## 2021-02-21 DIAGNOSIS — Z8679 Personal history of other diseases of the circulatory system: Secondary | ICD-10-CM

## 2021-02-21 DIAGNOSIS — Z66 Do not resuscitate: Secondary | ICD-10-CM | POA: Insufficient documentation

## 2021-02-21 LAB — CBC
HCT: 28.8 % — ABNORMAL LOW (ref 39.0–52.0)
Hemoglobin: 9.2 g/dL — ABNORMAL LOW (ref 13.0–17.0)
MCH: 27.5 pg (ref 26.0–34.0)
MCHC: 31.9 g/dL (ref 30.0–36.0)
MCV: 86 fL (ref 80.0–100.0)
Platelets: 128 10*3/uL — ABNORMAL LOW (ref 150–400)
RBC: 3.35 MIL/uL — ABNORMAL LOW (ref 4.22–5.81)
RDW: 21.8 % — ABNORMAL HIGH (ref 11.5–15.5)
WBC: 5.9 10*3/uL (ref 4.0–10.5)
nRBC: 0 % (ref 0.0–0.2)

## 2021-02-21 LAB — BASIC METABOLIC PANEL WITH GFR
Anion gap: 8 (ref 5–15)
BUN: 51 mg/dL — ABNORMAL HIGH (ref 8–23)
CO2: 23 mmol/L (ref 22–32)
Calcium: 8.7 mg/dL — ABNORMAL LOW (ref 8.9–10.3)
Chloride: 104 mmol/L (ref 98–111)
Creatinine, Ser: 2.16 mg/dL — ABNORMAL HIGH (ref 0.61–1.24)
GFR, Estimated: 31 mL/min — ABNORMAL LOW
Glucose, Bld: 124 mg/dL — ABNORMAL HIGH (ref 70–99)
Potassium: 4.1 mmol/L (ref 3.5–5.1)
Sodium: 135 mmol/L (ref 135–145)

## 2021-02-21 LAB — BRAIN NATRIURETIC PEPTIDE: B Natriuretic Peptide: 1988.6 pg/mL — ABNORMAL HIGH (ref 0.0–100.0)

## 2021-02-21 MED ORDER — TORSEMIDE 20 MG PO TABS: 40.0000 mg | ORAL_TABLET | Freq: Two times a day (BID) | ORAL | 3 refills | Status: DC

## 2021-02-21 NOTE — Progress Notes (Signed)
ADVANCED HF CLINIC NOTE  Primary Care: [name], MD Primary Cardiologist: Dr. Harl Bowie  HF Cardiologist: Dr. Haroldine Laws  Reason for Visit: Chronic Combined Systolic and Diastolic Heart Failure   HPI: John [name] is a 75 y.o. male w/systolic HF due to NICM (cath 9/21 no CAD), h/o CHB s/p PPM 2017,  HTN, DM2, atrial flutter, stage 3b CKD (baseline SCr ~1.5-2.0) and severe AS s/p TAVR 2/22.Marland Kitchen   Echo 7/21 EF 30-35%. RV mildly reduced. Mod-sev MR Moderate AS  TEE 8/21  moderate MR and moderate TR. AoV severely thickened/heavily calcified  [facility] 9/21 No CAD. Moderately elevated filling pressures and moderately reduced CO. CI 1.8. Also concern for RV pacing CM with 42% RV pacing  Several admits in 12/21 for ADHF.   Admitted 2/22 for severe HF and TAVR. Started on dobutamine support. Underwent TVRF 05/03/20. Diuresed and DBA weaned off.   Attempted CRT upgrade 3/22. Left subclavian vein occluded.  Echo 3/22: EF 25-30% moderate MR. TAVR stable  Admitted 7/22 for anemia and hyponatremia. Hgb 14 -> 7.3  got 2u RBCs. EGD with duodenal ulcer with visible vessel so Eliquis held. Could not adequately clip so underwent IR embolization.  Weekly metolazone restarted 9/22.  Admitted 10/8-10/10/22 for A/C CHF. He was diuresed with IV lasix + metolazone. Hospitalization c/b AKI on CKD3. Discharge weight 209 lbs.  Follow up 10/22 weight up 13 lbs in setting of being out of metolazone x 2 weeks. Instructed to take metolazone for 2 days then resume weekly.  Admitted 11/3-11/6/22 w/ acute diverticulitis. GI consulted and underwent EGD 11/4 showing nonobstructive schatzki ring; portal HTN gastropathy; & healed ulcer in duodenal bulb without stigmata of bleeding. Received 4 U PRBCs during hospitalization. He remained DNR.  Admitted 11/22 with GIB, hgb 6.4. GI consulted and had capsule endoscopy 02/07/21 showing old blood in the stomach but no new findings.Received 4 U PRBCs and octreotide, GI plans for  outpatient colonoscopy. Cards consulted after he became hypotensive requiring NE. Diuretics held with bump in SCr bu torsemide 20 mg bid resumed at discharge. Discharge weight 215 lbs, hgb 8.5.  Today he returns for post hospitalization HF follow up with his wife. Planning on colonoscopy this Thursday. Overall feeling better, breathing has improved some and he is able to get around the house without much SOB. Denies further bleeding, palpitations, CP, dizziness, edema, or PND/Orthopnea. Appetite fair. No fever or chills. Weight at home 214 pounds. Taking all medications. HH PT will start soon.  Past Medical History:  Diagnosis Date   Atrial flutter ([place]) 12/2010   Admitted with symptomatic bradycardia (HR 40s), atrial flutter with slow ventricular response 12/2010 + volume overload; AV nodal agents d/c'd and Pradaxa started; RFA in 01/2011   Chronic kidney disease, stage 3b (HCC)    Chronic systolic CHF (congestive heart failure) ([place])    Class 2 severe obesity due to excess calories with serious comorbidity and body mass index (BMI) of 37.0 to 37.9 in adult ([place]) 07/17/2017   Diabetes mellitus    non insulin dependant   Hematoma, chest wall 05/15/2020   Hyperlipidemia    Hypertension    Mitral regurgitation    NASH (nonalcoholic steatohepatitis)    NICM (nonischemic cardiomyopathy) (HCC)    Osteoarthritis    Polyarticular gout 05/15/2020   Presence of permanent cardiac pacemaker    PSVT (paroxysmal supraventricular tachycardia) (HCC)    Possibly atrial flutter   S/P TAVR (transcatheter aortic valve replacement) 05/03/2020   s/p TAVR with a 29  mm Edwards S3U via the subclavian approach with Dr. Angelena Form & Dr. Cyndia Bent    Severe aortic stenosis    Current Outpatient Medications  Medication Sig Dispense Refill   ACCU-CHEK AVIVA PLUS test strip TEST BLOOD SUGAR TWICE DAILY BEFORE BREAKFAST AND AT BEDTIME 200 strip 1   acetaminophen (TYLENOL) 325 MG tablet Take 2 tablets (650 mg total) by  mouth every 4 (four) hours as needed for headache or mild pain.     allopurinol (ZYLOPRIM) 300 MG tablet Take 150 mg by mouth as needed.     benzonatate (TESSALON) 100 MG capsule Take 1 capsule (100 mg total) by mouth 3 (three) times daily as needed for cough. 90 capsule 11   camphor-menthol (ANTI-ITCH) lotion Apply 1 application topically daily as needed for itching.     carvedilol (COREG) 3.125 MG tablet Take 1 tablet (3.125 mg total) by mouth 2 (two) times daily with a meal. 60 tablet 4   colchicine 0.6 MG tablet Take 1 tablet (0.6 mg total) by mouth daily as needed. 30 tablet 6   Dulaglutide (TRULICITY) 1.5 NU/2.7OZ SOPN INJECT 1.5MG (1 PEN) SUBCUTANEOUSLY EVERY WEEK 6 mL 1   hydrocortisone cream 1 % Apply 1 application topically daily.     insulin degludec (TRESIBA FLEXTOUCH) 100 UNIT/ML FlexTouch Pen Inject 12 Units into the skin at bedtime. 15 mL 2   Insulin Pen Needle (BD PEN NEEDLE NANO U/F) 32G X 4 MM MISC 1 each by Does not apply route 4 (four) times daily. 150 each 5   linaclotide (LINZESS) 145 MCG CAPS capsule Take 1 capsule (145 mcg total) by mouth daily before breakfast. 90 capsule 3   metolazone (ZAROXOLYN) 2.5 MG tablet Take 1 tablet (2.5 mg total) by mouth once a week. Every Monday 5 tablet 4   Multiple Vitamins-Minerals (ONE-A-DAY MENS 50+) TABS Take 1 tablet by mouth daily with breakfast. Men     pantoprazole (PROTONIX) 40 MG tablet Take 1 tablet (40 mg total) by mouth daily. 60 tablet 1   potassium chloride SA (KLOR-CON M20) 20 MEQ tablet Take 1.5 tablets (30 mEq total) by mouth 2 (two) times daily. Take 2 extra tabs on Mon with metolazone 100 tablet 3   spironolactone (ALDACTONE) 25 MG tablet Take 0.5 tablets (12.5 mg total) by mouth daily. 15 tablet 6   torsemide (DEMADEX) 20 MG tablet Take 1 tablet (20 mg total) by mouth 2 (two) times daily.     aspirin EC 81 MG tablet Take 1 tablet (81 mg total) by mouth daily with breakfast. (Patient not taking: Reported on 02/21/2021) 30  tablet 2   No current facility-administered medications for this encounter.   No Known Allergies  Social History   Socioeconomic History   Marital status: Married    Spouse name: Not on file   Number of children: 2   Years of education: Not on file   Highest education level: Not on file  Occupational History   Occupation: Designer, industrial/product    Employer: RETIRED  Tobacco Use   Smoking status: Former    Packs/day: 1.00    Years: 10.00    Pack years: 10.00    Types: Cigarettes    Quit date: 03/26/1986    Years since quitting: 34.9   Smokeless tobacco: Never   Tobacco comments:    "stopped cigarette  smoking 1988"  Vaping Use   Vaping Use: Never used  Substance and Sexual Activity   Alcohol use: Not Currently    Alcohol/week: 0.0 standard  drinks    Comment: "quit alcohol ~ 2007"   Drug use: No   Sexual activity: Yes    Partners: Female  Other Topics Concern   Not on file  Social History Narrative   Not on file   Social Determinants of Health   Financial Resource Strain: Not on file  Food Insecurity: No Food Insecurity   Worried About Charity fundraiser in the Last Year: Never true   Ran Out of Food in the Last Year: Never true  Transportation Needs: No Transportation Needs   Lack of Transportation (Medical): No   Lack of Transportation (Non-Medical): No  Physical Activity: Not on file  Stress: Not on file  Social Connections: Not on file  Intimate Partner Violence: Not on file   Family History  Problem Relation Age of Onset   Heart attack Mother    Hypertension Mother    Heart attack Father    Hypertension Father    Heart attack Brother    Colon cancer Neg Hx    BP (!) 142/80   Pulse 77   Wt 99.2 kg   SpO2 98%   BMI 29.67 kg/m   Wt Readings from Last 3 Encounters:  02/21/21 99.2 kg  02/12/21 97.8 kg  02/01/21 99.5 kg   PHYSICAL EXAM: General:  NAD. No resp difficulty, arrived in [facility], frail, elderly HEENT: Normal Neck: Supple. JVP 6-7, + v  waves. Carotids 2+ bilat; no bruits. No lymphadenopathy or thryomegaly appreciated. Cor: PMI nondisplaced. Irregular rate & rhythm. No rubs, gallops or murmurs. Lungs: Faint crackles RLL Abdomen: Soft, nontender, nondistended. No hepatosplenomegaly. No bruits or masses. Good bowel sounds. Extremities: No cyanosis, clubbing, rash, 1+ BLE pre-tibial edema  Neuro: Alert & oriented x 3, cranial nerves grossly intact. Moves all 4 extremities w/o difficulty. Affect pleasant.  ECG (personally reviewed): artifact, appears v-paced   ICD interrogation (personally reviewed): No VT, 100% AF, pacing 6%  ReDs: 37%  ASSESSMENT & PLAN: 1. Chronic Combined Systolic and Diastolic Heart Failure/NICM: - Echo 08/2015 EF 50-55% (PPM placed in 08/2015)  - Echo 10/2015 EF dropped to 40-45% - Echo 2018 EF 45% - Echo 2021 EF 30-35%, RV mildly reduced  - Echo 3/22 EF 25-30% TAVR ok. Mod MR .-LHC 9/21 that showed no coronary disease.  - based on timing of drop in EF and 42% pacing percentage, concern for RV paced CM +AS. - Left subclavian vein occluded -> unable to upgrade to CRT. - s/p TAVR 2/22 - NYHA III-IIIb, functional status difficult due to physical deconditioning, but breathing is better. Volume mildly elevated on exam, ReDs 37%. Would not take metolazone today with up-coming bowel prep for colonoscopy. - Increase torsemide to 40 mg bid.  - Keep metolazone 2.5 mg + extra 40 KCL on Mondays. - Continue spiro 12.5 mg daily.  - Continue carvedilol 3.125 mg bid. - Off Farxiga due to yeast. - PPM interrogated in clinic No VT. 100% AF. Pacing < 10% so no need for CRT currently.  - Possible candidate for ADI study. - BMET & BNP today, repeat BMET in 10 days.  2. Mitral Regurgitation  - Moderate by TEE 8/21 and Echo 3/22 - Likely functional from dilated CM.  - Will follow. - Repeat echo next year.  3. Aortic Stenosis  - s/p TAVR 3/22 - Stable on echo.   4. Atrial Flutter/ Chronic Atrial Fibrillation -  s/p AFL ablation.  - In chronic Afib, rate controlled.  - s/p frequent GI bleed so  off Eliquis.  - CHA2DS2-VASc Score = at least 5  - Unable to safely restart Eliquis at this time. He has been referred to EP for Watchman consideration.  5. Stage IIIb CKD - Followed by nephrology.  - Baseline SCr ~1.8. - Recheck labs today.  6. T2DM - On insulin. - Off Farxiga due to yeast.  7. H/o CHB  - s/p PPM followed by Dr. Lovena Le  - ? RV paced CM. He is now pacing < 10%. Had drop in EF 2 months after pacer was placed.  - Following with Dr.Taylor. Attempted CRT upgrade 3/22 but left subclavian vein occluded.  8. UGIB - EGD in 7/22 with duodenal ulcer with visible vessel. Underwent coiling of GDA.  - s/p EGD 11/22 with no active bleed; received 4 U PRBCs. - No further bleeding. ASA on hold for colonoscopy. - CBC today.  I think he can keep his follow up with Dr. Haroldine Laws in 8 weeks as scheduled. Instructed wife to call clinic with any concerns and we can see him sooner.  Rafael Bihari, FNP  1:58 PM

## 2021-02-21 NOTE — Progress Notes (Signed)
ReDS Vest / Clip - 02/21/21 1400       ReDS Vest / Clip   Station Marker C    Ruler Value 30    ReDS Value Range Moderate volume overload    ReDS Actual Value 37

## 2021-02-21 NOTE — Patient Instructions (Signed)
INCREASE Torsemide to 40 mg twice a day  Labs today We will only contact you if something comes back abnormal or we need to make some changes. Otherwise no news is good news!  Labs needed in 7-10 days  Keep follow up as scheduled  Do the following things EVERYDAY: Weigh yourself in the morning before breakfast. Write it down and keep it in a log. Take your medicines as prescribed Eat low salt foods--Limit salt (sodium) to 2000 mg per day.  Stay as active as you can everyday Limit all fluids for the day to less than 2 liters  At the [facility], you and your health needs are our priority. As part of our continuing mission to provide you with exceptional heart care, we have created designated Provider Care Teams. These Care Teams include your primary Cardiologist (physician) and Advanced Practice Providers (APPs- Physician Assistants and Nurse Practitioners) who all work together to provide you with the care you need, when you need it.   You may see any of the following providers on your designated Care Team at your next follow up: Dr Glori Bickers Dr Haynes Kerns, NP Lyda Jester, [place] [facility] [place], [place] Audry Riles, PharmD   Please be sure to bring in all your medications bottles to every appointment.   If you have any questions or concerns before your next appointment please send [place] a message through [place] or call our office at [phone].    TO LEAVE A MESSAGE FOR THE NURSE SELECT OPTION 2, PLEASE LEAVE A MESSAGE INCLUDING: YOUR NAME DATE OF BIRTH CALL BACK NUMBER REASON FOR CALL**this is important as we prioritize the call backs  YOU WILL RECEIVE A CALL BACK THE SAME DAY AS LONG AS YOU CALL BEFORE 4:00 PM

## 2021-02-22 ENCOUNTER — Other Ambulatory Visit (INDEPENDENT_AMBULATORY_CARE_PROVIDER_SITE_OTHER): Payer: Self-pay

## 2021-02-22 MED ORDER — LACTATED RINGERS IV SOLN: INTRAVENOUS | Status: DC

## 2021-02-23 ENCOUNTER — Encounter (HOSPITAL_COMMUNITY)
Admission: RE | Disposition: A | Discharge status: Home / Self Care | Payer: Self-pay | Source: Ambulatory Visit | Attending: Internal Medicine

## 2021-02-23 ENCOUNTER — Ambulatory Visit (HOSPITAL_COMMUNITY)
Admission: RE | Admit: 2021-02-23 | Discharge: 2021-02-23 | Disposition: A | Discharge status: Home / Self Care | Payer: Medicare HMO | Source: Ambulatory Visit | Attending: Internal Medicine | Admitting: Internal Medicine

## 2021-02-23 ENCOUNTER — Other Ambulatory Visit: Payer: Self-pay

## 2021-02-23 ENCOUNTER — Encounter (HOSPITAL_COMMUNITY): Payer: Self-pay | Admitting: Internal Medicine

## 2021-02-23 ENCOUNTER — Ambulatory Visit (INDEPENDENT_AMBULATORY_CARE_PROVIDER_SITE_OTHER): Payer: Medicare HMO | Admitting: Gastroenterology

## 2021-02-23 DIAGNOSIS — K922 Gastrointestinal hemorrhage, unspecified: Secondary | ICD-10-CM | POA: Insufficient documentation

## 2021-02-23 DIAGNOSIS — E119 Type 2 diabetes mellitus without complications: Secondary | ICD-10-CM | POA: Insufficient documentation

## 2021-02-23 DIAGNOSIS — K644 Residual hemorrhoidal skin tags: Secondary | ICD-10-CM | POA: Diagnosis not present

## 2021-02-23 DIAGNOSIS — K5730 Diverticulosis of large intestine without perforation or abscess without bleeding: Secondary | ICD-10-CM | POA: Diagnosis not present

## 2021-02-23 HISTORY — PX: COLONOSCOPY: SHX5424

## 2021-02-23 LAB — GLUCOSE, CAPILLARY: Glucose-Capillary: 103 mg/dL — ABNORMAL HIGH (ref 70–99)

## 2021-02-23 SURGERY — COLONOSCOPY
Anesthesia: Moderate Sedation

## 2021-02-23 MED ORDER — MIDAZOLAM HCL 5 MG/5ML IJ SOLN
INTRAMUSCULAR | Status: AC
  Filled 2021-02-23: qty 10

## 2021-02-23 MED ORDER — MEPERIDINE HCL 50 MG/ML IJ SOLN
INTRAMUSCULAR | Status: DC | PRN
  Administered 2021-02-23: 25 mg

## 2021-02-23 MED ORDER — SODIUM CHLORIDE 0.9 % IV SOLN: INTRAVENOUS | Status: DC

## 2021-02-23 MED ORDER — MIDAZOLAM HCL 5 MG/5ML IJ SOLN
INTRAMUSCULAR | Status: DC | PRN
  Administered 2021-02-23: 2 mg via INTRAVENOUS

## 2021-02-23 MED ORDER — FENTANYL CITRATE (PF) 100 MCG/2ML IJ SOLN
INTRAMUSCULAR | Status: AC
  Filled 2021-02-23: qty 2

## 2021-02-23 MED ORDER — MEPERIDINE HCL 50 MG/ML IJ SOLN
INTRAMUSCULAR | Status: AC
  Filled 2021-02-23: qty 1

## 2021-02-23 NOTE — H&P (Signed)
John [name] is an 75 y.o. male.   Chief Complaint: Patient is here for colonoscopy. HPI: Patient is 75 year old African-American male with multiple medical problems who is here for diagnostic colonoscopy.  He was admitted back in July this year with melena and anemia.  EGD revealed large deep bulbar ulcer with stigmata of bleeding and he required embolization.  He had follow-up EGD last month and ulcer completely healed.  He presented again about 3 weeks ago with melena and anemia.  During that admission he received 4 units of PRBCs.  He underwent EGD followed by given capsule followed another EGD but bleeding source could not be identified.  Therefore diagnostic colonoscopy was recommended on an outpatient basis.  His last colonoscopy was in January 2019. He has not had any further episodes of bleeding.  He says he has lost weight since he left the hospital 12 days ago.  He feels the weight loss is due to resolution of extremity edema.  He denies abdominal pain. He believes he did not take aspirin yesterday. He denies chest pain or shortness of breath. His main complain today is itching.  He says he has had itching for very long time but nothing is provided relief.  He has an appointment to see a dermatologist.  He had blood work 2 days ago.  His hemoglobin was 9.2 g.  It was 8.5 g when he left the hospital for 12 days ago.  Past Medical History:  Diagnosis Date   Atrial flutter ([place]) 12/2010   Admitted with symptomatic bradycardia (HR 40s), atrial flutter with slow ventricular response 12/2010 + volume overload; AV nodal agents d/c'd and Pradaxa started; RFA in 01/2011   Chronic kidney disease, stage 3b (HCC)    Chronic systolic CHF (congestive heart failure) (HCC)    Class 2 severe obesity due to excess calories with serious comorbidity and body mass index (BMI) of 37.0 to 37.9 in adult ([place]) 07/17/2017   Diabetes mellitus    non insulin dependant   Hematoma, chest wall 05/15/2020    Hyperlipidemia    Hypertension    Mitral regurgitation    NASH (nonalcoholic steatohepatitis)    NICM (nonischemic cardiomyopathy) (HCC)    Osteoarthritis    Polyarticular gout 05/15/2020   Presence of permanent cardiac pacemaker    PSVT (paroxysmal supraventricular tachycardia) (HCC)    Possibly atrial flutter   S/P TAVR (transcatheter aortic valve replacement) 05/03/2020   s/p TAVR with a 29 mm Edwards S3U via the subclavian approach with Dr. Angelena Form & Dr. Cyndia Bent    Severe aortic stenosis     Past Surgical History:  Procedure Laterality Date   ATRIAL FLUTTER ABLATION N/A 02/07/2011   Procedure: ATRIAL FLUTTER ABLATION;  Surgeon: Evans Lance, MD;  Location: [facility] CATH LAB;  Service: Cardiovascular;  Laterality: N/A;   BIV UPGRADE N/A 06/08/2020   Procedure: BIV PPM UPGRADE;  Surgeon: Evans Lance, MD;  Location: [place] CV LAB;  Service: Cardiovascular;  Laterality: N/A;   CARDIAC ELECTROPHYSIOLOGY STUDY AND ABLATION  02/07/2011   CARDIOVERSION N/A 03/23/2016   Procedure: CARDIOVERSION;  Surgeon: Evans Lance, MD;  Location: [place];  Service: Cardiovascular;  Laterality: N/A;   COLONOSCOPY N/A 04/17/2017   Jessia Kief: 3 polyps in [facility], diverticulosis   EP IMPLANTABLE DEVICE N/A 08/29/2015   Procedure: Pacemaker Implant;  Surgeon: Evans Lance, MD;  Location: [place] CV LAB;  Service: Cardiovascular;  Laterality: N/A;   EP IMPLANTABLE DEVICE N/A 12/07/2015   Procedure: PPM  Lead Revision/Repair;  Surgeon: Evans Lance, MD;  Location: [place] CV LAB;  Service: Cardiovascular;  Laterality: N/A;   ESOPHAGOGASTRODUODENOSCOPY (EGD) WITH PROPOFOL N/A 10/18/2020   Castaneda: normal esophagus, erythematous mucosa in gastric body, non bleeding duodenal ulcer with a non bleeding visible vessel (forrest Class IIa). Injected, clip placed, no specimens collected.   ESOPHAGOGASTRODUODENOSCOPY (EGD) WITH PROPOFOL N/A 01/27/2021   Procedure: ESOPHAGOGASTRODUODENOSCOPY (EGD) WITH  PROPOFOL;  Surgeon: [name], DO;  [facility];  Service: Endoscopy;  Laterality: N/A;   ESOPHAGOGASTRODUODENOSCOPY (EGD) WITH PROPOFOL N/A 02/09/2021   Procedure: ESOPHAGOGASTRODUODENOSCOPY (EGD) WITH PROPOFOL;  Surgeon: [name], DO;  [facility];  Service: Endoscopy;  Laterality: N/A;   GIVENS CAPSULE STUDY N/A 02/07/2021   Procedure: GIVENS CAPSULE STUDY;  Surgeon: Harvel Quale, MD;  [facility];  Service: Gastroenterology;  Laterality: N/A;   IR ANGIOGRAM SELECTIVE EACH ADDITIONAL VESSEL  10/19/2020   IR ANGIOGRAM SELECTIVE EACH ADDITIONAL VESSEL  10/19/2020   IR ANGIOGRAM VISCERAL SELECTIVE  10/19/2020   IR ANGIOGRAM VISCERAL SELECTIVE  10/19/2020   IR EMBO ART  VEN HEMORR LYMPH EXTRAV  INC GUIDE ROADMAPPING  10/19/2020   IR US GUIDE VASC ACCESS RIGHT  10/19/2020   KNEE ARTHROSCOPY  03/27/2003   left   LUMBAR SPINE SURGERY     "I've had 6 ORs 1972 thru 2004"   POLYPECTOMY  04/17/2017   Procedure: POLYPECTOMY;  Surgeon: Rogene Houston, MD;  [facility];  Service: Endoscopy;;  transverse colon x3;   RIGHT HEART CATH N/A 04/28/2020   Procedure: RIGHT HEART CATH;  Surgeon: Jolaine Artist, MD;  Location: [place] CV LAB;  Service: Cardiovascular;  Laterality: N/A;   RIGHT/LEFT HEART CATH AND CORONARY ANGIOGRAPHY N/A 12/21/2019   Procedure: RIGHT/LEFT HEART CATH AND CORONARY ANGIOGRAPHY;  Surgeon: Nelva Bush, MD;  Location: [place] CV LAB;  Service: Cardiovascular;  Laterality: N/A;   TEE WITHOUT CARDIOVERSION N/A 11/05/2019   Procedure: TRANSESOPHAGEAL ECHOCARDIOGRAM (TEE) WITH PROPOFOL;  Surgeon: Arnoldo Lenis, MD;  [facility];  Service: Endoscopy;  Laterality: N/A;   TEE WITHOUT CARDIOVERSION N/A 05/03/2020   Procedure: TRANSESOPHAGEAL ECHOCARDIOGRAM (TEE);  Surgeon: Burnell Blanks, MD;  Location: [place];  Service: Open Heart Surgery;  Laterality: N/A;    Family  History  Problem Relation Age of Onset   Heart attack Mother    Hypertension Mother    Heart attack Father    Hypertension Father    Heart attack Brother    Colon cancer Neg Hx    Social History:  reports that he quit smoking about 34 years ago. His smoking use included cigarettes. He has a 10.00 pack-year smoking history. He has never used smokeless tobacco. He reports that he does not currently use alcohol. He reports that he does not use drugs.  Allergies: No Known Allergies  Medications Prior to Admission  Medication Sig Dispense Refill   acetaminophen (TYLENOL) 325 MG tablet Take 2 tablets (650 mg total) by mouth every 4 (four) hours as needed for headache or mild pain.     allopurinol (ZYLOPRIM) 300 MG tablet Take 150 mg by mouth as needed.     benzonatate (TESSALON) 100 MG capsule Take 1 capsule (100 mg total) by mouth 3 (three) times daily as needed for cough. 90 capsule 11   carvedilol (COREG) 3.125 MG tablet Take 1 tablet (3.125 mg total) by mouth 2 (two) times daily with a meal. 60  tablet 4   colchicine 0.6 MG tablet Take 1 tablet (0.6 mg total) by mouth daily as needed. 30 tablet 6   hydrocortisone cream 1 % Apply 1 application topically daily.     insulin degludec (TRESIBA FLEXTOUCH) 100 UNIT/ML FlexTouch Pen Inject 12 Units into the skin at bedtime. 15 mL 2   linaclotide (LINZESS) 145 MCG CAPS capsule Take 1 capsule (145 mcg total) by mouth daily before breakfast. 90 capsule 3   Multiple Vitamins-Minerals (ONE-A-DAY MENS 50+) TABS Take 1 tablet by mouth daily with breakfast. Men     pantoprazole (PROTONIX) 40 MG tablet Take 1 tablet (40 mg total) by mouth daily. 60 tablet 1   potassium chloride SA (KLOR-CON M20) 20 MEQ tablet Take 1.5 tablets (30 mEq total) by mouth 2 (two) times daily. Take 2 extra tabs on Mon with metolazone 100 tablet 3   spironolactone (ALDACTONE) 25 MG tablet Take 0.5 tablets (12.5 mg total) by mouth daily. 15 tablet 6   torsemide (DEMADEX) 20 MG tablet  Take 2 tablets (40 mg total) by mouth 2 (two) times daily. 120 tablet 3   ACCU-CHEK AVIVA PLUS test strip TEST BLOOD SUGAR TWICE DAILY BEFORE BREAKFAST AND AT BEDTIME 200 strip 1   aspirin EC 81 MG tablet Take 1 tablet (81 mg total) by mouth daily with breakfast. (Patient not taking: Reported on 02/21/2021) 30 tablet 2   camphor-menthol (ANTI-ITCH) lotion Apply 1 application topically daily as needed for itching.     Dulaglutide (TRULICITY) 1.5 KV/4.2VZ SOPN INJECT 1.5MG (1 PEN) SUBCUTANEOUSLY EVERY WEEK 6 mL 1   Insulin Pen Needle (BD PEN NEEDLE NANO U/F) 32G X 4 MM MISC 1 each by Does not apply route 4 (four) times daily. 150 each 5   metolazone (ZAROXOLYN) 2.5 MG tablet Take 1 tablet (2.5 mg total) by mouth once a week. Every Monday 5 tablet 4    Results for orders placed or performed during the hospital encounter of 02/23/21 (from the past 48 hour(s))  Glucose, capillary     Status: Abnormal   Collection Time: 02/23/21 11:15 AM  Result Value Ref Range   Glucose-Capillary 103 (H) 70 - 99 mg/dL    Comment: Glucose reference range applies only to samples taken after fasting for at least 8 hours.   No results found.  Review of Systems  Blood pressure 99/66, pulse 75, temperature 97.8 F (36.6 C), temperature source Oral, resp. rate 12, height 6' (1.829 m), weight 92.5 kg, SpO2 100 %. Physical Exam HENT:     Mouth/Throat:     Mouth: Mucous membranes are moist.     Pharynx: Oropharynx is clear.  Eyes:     General: No scleral icterus.    Conjunctiva/sclera: Conjunctivae normal.  Cardiovascular:     Rate and Rhythm: Normal rate and regular rhythm.     Heart sounds: Normal heart sounds. No murmur heard.    Comments: He has pacemaker in left pectoral region. Pulmonary:     Effort: Pulmonary effort is normal.     Breath sounds: Normal breath sounds.  Abdominal:     General: There is no distension.     Palpations: Abdomen is soft. There is no mass.     Tenderness: There is no  abdominal tenderness.  Musculoskeletal:     Cervical back: Neck supple.     Comments: Trace edema around ankles.  Lymphadenopathy:     Cervical: No cervical adenopathy.  Skin:    General: Skin is warm and dry.  Neurological:     Mental Status: He is alert.     Assessment/Plan  Gastrointestinal bleeding.  Source not identified on 2 EGDs and small bowel given capsule study. History of bleeding duodenal ulcer who which was documented to have healed on 2 recent EGDs. Diagnostic colonoscopy.  Hildred Laser, MD 02/23/2021, 11:37 AM

## 2021-02-23 NOTE — Discharge Instructions (Addendum)
Resume usual medications including aspirin at a low Modified carb high-fiber diet. No driving for 24 hours. If you experience rectal bleeding or tarry stools initially referred to emergency room.

## 2021-02-23 NOTE — Op Note (Signed)
[facility] Patient Name: John [name] Procedure Date: 02/23/2021 11:30 AM MRN: [id number] Date of Birth: 04/02/1945 Attending MD: Hildred Laser , MD CSN: [id number] Age: 75 Admit Type: Outpatient Procedure:                Colonoscopy Indications:              Gastrointestinal bleeding Providers:                Hildred Laser, MD, Lurline Del, RN, Randa Spike,                            Technician Referring MD:             Delphina Cahill, MD Medicines:                Fentanyl 25 micrograms IV, Midazolam 3 mg IV Complications:            No immediate complications. Estimated Blood Loss:     Estimated blood loss: none. Procedure:                Pre-Anesthesia Assessment:                           - Prior to the procedure, a History and Physical                            was performed, and patient medications and                            allergies were reviewed. The patient's tolerance of                            previous anesthesia was also reviewed. The risks                            and benefits of the procedure and the sedation                            options and risks were discussed with the patient.                            All questions were answered, and informed consent                            was obtained. Prior Anticoagulants: The patient has                            taken no previous anticoagulant or antiplatelet                            agents except for aspirin. ASA Grade Assessment:                            III - A patient with severe systemic disease. After  reviewing the risks and benefits, the patient was                            deemed in satisfactory condition to undergo the                            procedure.                           After obtaining informed consent, the colonoscope                            was passed under direct vision. Throughout the                            procedure, the patient's blood  pressure, pulse, and                            oxygen saturations were monitored continuously. The                            [id number]-[id number] ([id number]) scope was introduced through                            the anus and advanced to the the cecum, identified                            by appendiceal orifice and ileocecal valve. The                            colonoscopy was somewhat difficult due to a                            redundant colon. Successful completion of the                            procedure was aided by scope guide. The patient                            tolerated the procedure well. The quality of the                            bowel preparation was adequate. The ileocecal                            valve, appendiceal orifice, and rectum were                            photographed. Scope In: 12:00:28 PM Scope Out: 12:23:15 PM Scope Withdrawal Time: 0 hours 4 minutes 8 seconds  Total Procedure Duration: 0 hours 22 minutes 47 seconds  Findings:      The perianal and digital rectal examinations were normal.      Multiple small and large-mouthed diverticula were found in the entire  colon.      The exam was otherwise normal throughout the examined colon.      External hemorrhoids were found during retroflexion. The hemorrhoids       were small. Impression:               - Diverticulosis in the entire examined colon.                           - External hemorrhoids.                           - No specimens collected. Moderate Sedation:      Moderate (conscious) sedation was administered by the endoscopy nurse       and supervised by the endoscopist. The following parameters were       monitored: oxygen saturation, heart rate, blood pressure, CO2       capnography and response to care. Total physician intraservice time was       31 minutes. Recommendation:           - Patient has a contact number available for                            emergencies. The signs and  symptoms of potential                            delayed complications were discussed with the                            patient. Return to normal activities tomorrow.                            Written discharge instructions were provided to the                            patient.                           - High fiber diet and diabetic (ADA) diet today.                           - Continue present medications.                           - Report to emergency room if he have rectal                            bleeding or tarry stools again. Procedure Code(s):        --- Professional ---                           [phone], Colonoscopy, flexible; diagnostic, including                            collection of specimen(s) by brushing or washing,  when performed (separate procedure)                           [id number], Moderate sedation; each additional 15                            minutes intraservice time                           G0500, Moderate sedation services provided by the                            same physician or other qualified health care                            professional performing a gastrointestinal                            endoscopic service that sedation supports,                            requiring the presence of an independent trained                            observer to assist in the monitoring of the                            patient's level of consciousness and physiological                            status; initial 15 minutes of intra-service time;                            patient age 20 years or older (additional time may                            be reported with [phone], as appropriate) Diagnosis Code(s):        --- Professional ---                           K64.4, Residual hemorrhoidal skin tags                           K92.2, Gastrointestinal hemorrhage, unspecified                           K57.30, Diverticulosis of large  intestine without                            perforation or abscess without bleeding CPT copyright 2019 [employer]. All rights reserved. The codes documented in this report are preliminary and upon coder review may  be revised to meet current compliance requirements. Hildred Laser, MD Hildred Laser, MD 02/23/2021 12:36:36 PM This report has been signed electronically. Number of Addenda: 0

## 2021-02-24 DIAGNOSIS — E1122 Type 2 diabetes mellitus with diabetic chronic kidney disease: Secondary | ICD-10-CM | POA: Diagnosis not present

## 2021-02-24 DIAGNOSIS — E559 Vitamin D deficiency, unspecified: Secondary | ICD-10-CM | POA: Diagnosis not present

## 2021-02-24 DIAGNOSIS — D649 Anemia, unspecified: Secondary | ICD-10-CM | POA: Diagnosis not present

## 2021-02-24 DIAGNOSIS — I129 Hypertensive chronic kidney disease with stage 1 through stage 4 chronic kidney disease, or unspecified chronic kidney disease: Secondary | ICD-10-CM | POA: Diagnosis not present

## 2021-02-24 DIAGNOSIS — N189 Chronic kidney disease, unspecified: Secondary | ICD-10-CM | POA: Diagnosis not present

## 2021-02-24 DIAGNOSIS — I5022 Chronic systolic (congestive) heart failure: Secondary | ICD-10-CM | POA: Diagnosis not present

## 2021-02-24 DIAGNOSIS — E8722 Chronic metabolic acidosis: Secondary | ICD-10-CM | POA: Diagnosis not present

## 2021-02-27 DIAGNOSIS — E1165 Type 2 diabetes mellitus with hyperglycemia: Secondary | ICD-10-CM | POA: Diagnosis not present

## 2021-02-27 DIAGNOSIS — I10 Essential (primary) hypertension: Secondary | ICD-10-CM | POA: Diagnosis not present

## 2021-03-01 ENCOUNTER — Encounter (HOSPITAL_COMMUNITY): Payer: Self-pay | Admitting: Internal Medicine

## 2021-03-01 DIAGNOSIS — K219 Gastro-esophageal reflux disease without esophagitis: Secondary | ICD-10-CM | POA: Diagnosis not present

## 2021-03-01 DIAGNOSIS — E211 Secondary hyperparathyroidism, not elsewhere classified: Secondary | ICD-10-CM | POA: Diagnosis not present

## 2021-03-01 DIAGNOSIS — K269 Duodenal ulcer, unspecified as acute or chronic, without hemorrhage or perforation: Secondary | ICD-10-CM | POA: Diagnosis not present

## 2021-03-01 DIAGNOSIS — I509 Heart failure, unspecified: Secondary | ICD-10-CM | POA: Diagnosis not present

## 2021-03-01 DIAGNOSIS — J302 Other seasonal allergic rhinitis: Secondary | ICD-10-CM | POA: Diagnosis not present

## 2021-03-01 DIAGNOSIS — M109 Gout, unspecified: Secondary | ICD-10-CM | POA: Diagnosis not present

## 2021-03-01 DIAGNOSIS — Z950 Presence of cardiac pacemaker: Secondary | ICD-10-CM | POA: Diagnosis not present

## 2021-03-01 DIAGNOSIS — E1165 Type 2 diabetes mellitus with hyperglycemia: Secondary | ICD-10-CM | POA: Diagnosis not present

## 2021-03-01 DIAGNOSIS — N1832 Chronic kidney disease, stage 3b: Secondary | ICD-10-CM | POA: Diagnosis not present

## 2021-03-06 ENCOUNTER — Other Ambulatory Visit: Payer: Self-pay | Admitting: Internal Medicine

## 2021-03-06 DIAGNOSIS — I509 Heart failure, unspecified: Secondary | ICD-10-CM | POA: Diagnosis not present

## 2021-03-07 LAB — BASIC METABOLIC PANEL
BUN/Creatinine Ratio: 18 (ref 10–24)
BUN: 49 mg/dL — ABNORMAL HIGH (ref 8–27)
CO2: 19 mmol/L — ABNORMAL LOW (ref 20–29)
Calcium: 8.7 mg/dL (ref 8.6–10.2)
Chloride: 97 mmol/L (ref 96–106)
Creatinine, Ser: 2.68 mg/dL — ABNORMAL HIGH (ref 0.76–1.27)
Glucose: 165 mg/dL — ABNORMAL HIGH (ref 70–99)
Potassium: 5.1 mmol/L (ref 3.5–5.2)
Sodium: 134 mmol/L (ref 134–144)
eGFR: 24 mL/min/{1.73_m2} — ABNORMAL LOW (ref >59)

## 2021-03-08 ENCOUNTER — Ambulatory Visit (HOSPITAL_COMMUNITY)
Admission: RE | Admit: 2021-03-08 | Discharge: 2021-03-08 | Disposition: A | Discharge status: Home / Self Care | Payer: Medicare HMO | Source: Ambulatory Visit | Attending: Gastroenterology | Admitting: Gastroenterology

## 2021-03-08 ENCOUNTER — Other Ambulatory Visit: Payer: Self-pay

## 2021-03-08 DIAGNOSIS — R188 Other ascites: Secondary | ICD-10-CM | POA: Diagnosis not present

## 2021-03-08 DIAGNOSIS — K7460 Unspecified cirrhosis of liver: Secondary | ICD-10-CM | POA: Diagnosis not present

## 2021-03-08 DIAGNOSIS — K8020 Calculus of gallbladder without cholecystitis without obstruction: Secondary | ICD-10-CM | POA: Diagnosis not present

## 2021-03-10 ENCOUNTER — Telehealth (HOSPITAL_COMMUNITY): Payer: Self-pay | Admitting: Surgery

## 2021-03-10 DIAGNOSIS — I5022 Chronic systolic (congestive) heart failure: Secondary | ICD-10-CM

## 2021-03-10 NOTE — Telephone Encounter (Signed)
Patient called and results reviewed.  He will return for labwork per Dr. Haroldine Laws on 03/14/21.

## 2021-03-10 NOTE — Telephone Encounter (Signed)
-----   Message from Jolaine Artist, MD sent at 03/10/2021 12:26 PM EST ----- Repeat BMET next week please

## 2021-03-14 ENCOUNTER — Ambulatory Visit (HOSPITAL_COMMUNITY)
Admission: RE | Admit: 2021-03-14 | Discharge: 2021-03-14 | Disposition: A | Discharge status: Home / Self Care | Payer: Medicare HMO | Source: Ambulatory Visit | Attending: Internal Medicine | Admitting: Internal Medicine

## 2021-03-14 ENCOUNTER — Other Ambulatory Visit: Payer: Self-pay

## 2021-03-14 DIAGNOSIS — I5022 Chronic systolic (congestive) heart failure: Secondary | ICD-10-CM | POA: Diagnosis not present

## 2021-03-14 LAB — BASIC METABOLIC PANEL
Anion gap: 7 (ref 5–15)
BUN: 37 mg/dL — ABNORMAL HIGH (ref 8–23)
CO2: 24 mmol/L (ref 22–32)
Calcium: 8.6 mg/dL — ABNORMAL LOW (ref 8.9–10.3)
Chloride: 103 mmol/L (ref 98–111)
Creatinine, Ser: 2.4 mg/dL — ABNORMAL HIGH (ref 0.61–1.24)
GFR, Estimated: 27 mL/min — ABNORMAL LOW (ref >60)
Glucose, Bld: 178 mg/dL — ABNORMAL HIGH (ref 70–99)
Potassium: 4.1 mmol/L (ref 3.5–5.1)
Sodium: 134 mmol/L — ABNORMAL LOW (ref 135–145)

## 2021-03-14 NOTE — Progress Notes (Signed)
Pt came for lab visit and c/o increased shortness of breath and retaining fluid. Pt said his weight is only up about 3lbs in a week.  Pt and wife said pt is taking all meds as prescribed. Per Allena Katz, FNP wait until labs come back before we make any changes we need to see his kidney function. Pt aware and agreeable.

## 2021-03-16 ENCOUNTER — Telehealth: Payer: Self-pay | Admitting: Physician Assistant

## 2021-03-16 ENCOUNTER — Telehealth (HOSPITAL_COMMUNITY): Payer: Self-pay | Admitting: Adult Health

## 2021-03-16 ENCOUNTER — Other Ambulatory Visit: Payer: Self-pay | Admitting: Physician Assistant

## 2021-03-16 ENCOUNTER — Telehealth (HOSPITAL_COMMUNITY): Payer: Self-pay | Admitting: *Deleted

## 2021-03-16 DIAGNOSIS — Z952 Presence of prosthetic heart valve: Secondary | ICD-10-CM

## 2021-03-16 NOTE — Telephone Encounter (Signed)
° °  [facility]   MULTIDISCIPLINARY HEART VALVE TEAM   Spoke to patient today about getting 1 year TAVR follow up set up. He has had a lot of issues recently and being seen by Dr. Quentin Ore (for [facility] referral) and Dr Harl Bowie next week. He has been having ongoing issues with dyspnea and LE edema. She says his left leg is about to burst open it's so full of fluid. Recent labs showed creat ~2.4 which was slightly improved from previous. He is on Demedex 97m BID and metolazone as needed. He has taken metolazone Monday and yesterday. She said his legs haven't improved much at all. Weight is up to 217 from a baseline of 209-210 lbs.   I will route this message to the advanced CHF team as he may need in home IV lasix or admission.    He has NYHA class III symptoms currently. [facility]   [facility]  K[facility]Q-12 03/16/2021 05/30/2020 04/04/2020  1 a. Ability to shower/bathe Slightly limited Slightly limited Moderately limited  1 b. Ability to walk 1 block Quite a bit limited Moderately limited Slightly limited  1 c. Ability to hurry/jog Other, Did not do Quite a bit limited Extremely limited  2. Edema feet/ankles/legs Every morning Every morning Every morning  3. Limited by fatigue All of the time All of the time Several times a day  4. Limited by dyspnea All of the time Several times a day Several times a day  5. Sitting up / on 3+ pillows Every night - Every night  6. Limited enjoyment of life Limited quite a bit Extremely limited Limited quite a bit  7. Rest of life w/ symptoms Mostly dissatisfied Mostly dissatisfied Not at all satisfied  8 a. Participation in hobbies Severely limited Limited quite a bit Severely limited  8 b. Participation in chores Severely limited N/A, did not do for other reasons Limited quite a bit  8 c. Visiting family/friends Severely limited Moderately limited Limited quite a bit    KAngelena Form PA-C  [facility]

## 2021-03-16 NOTE — Telephone Encounter (Signed)
Pts physical therapist called to report pts weight is up 7lbs since 12/15. Pts current weight is 217lbs.  Spoke with pts wife she said pt is not short of breath but has really swollen legs. No other complaints at this time.  Routed to [place] for advice

## 2021-03-16 NOTE — Telephone Encounter (Signed)
° °  Called for increased weight gain.   He has been taking metolazone weekly and torsemide 40 mg twice a day. No recent change in diet.   I called him and instructed to take metolazone for the next 2 days + 40 meq potassium twice a day, and increase torsemide to 60 mg twice a day.    Please call tomorrow and check him. Needs BMET and appointment next week.   Abagale Boulos NP-C  5:03 PM

## 2021-03-21 NOTE — Progress Notes (Signed)
Electrophysiology Office Note:    Date:  03/22/2021   ID:  [name], DOB [birth year], MRN [id number]  PCP:  [name], MD  [facility] Cardiologist:  [name], MD  [facility] Electrophysiologist:  [name], MD   Referring MD: Rafael Bihari, FNP   Chief Complaint: AFL  History of Present Illness:    John [name] is a 75 y.o. male who presents for an evaluation of AFL at the request of Dr Lovena Le and Allena Katz. Their medical history includes AFL, CKD3b, obesity, DM, HLD, HTN, NASH, NICM, TAVR, PPM in situ.   He has a history of a bleeding duodenal ulcer.   He is referred to discuss watchman.   He is with his wife today in clinic.  He tells me that he is still very volume overloaded and has some dyspnea with exertion.  He has been consuming a lot of liquids.  He does not use compression stockings.  He does not elevate his feet at nighttime or when at rest.     Past Medical History:  Diagnosis Date   Atrial flutter ([place]) 12/2010   Admitted with symptomatic bradycardia (HR 40s), atrial flutter with slow ventricular response 12/2010 + volume overload; AV nodal agents d/c'd and Pradaxa started; RFA in 01/2011   Chronic kidney disease, stage 3b (HCC)    Chronic systolic CHF (congestive heart failure) (HCC)    Class 2 severe obesity due to excess calories with serious comorbidity and body mass index (BMI) of 37.0 to 37.9 in adult ([place]) 07/17/2017   Diabetes mellitus    non insulin dependant   Hematoma, chest wall 05/15/2020   Hyperlipidemia    Hypertension    Mitral regurgitation    NASH (nonalcoholic steatohepatitis)    NICM (nonischemic cardiomyopathy) (HCC)    Osteoarthritis    Polyarticular gout 05/15/2020   Presence of permanent cardiac pacemaker    PSVT (paroxysmal supraventricular tachycardia) (HCC)    Possibly atrial flutter   S/P TAVR (transcatheter aortic valve replacement) 05/03/2020   s/p TAVR with a 29 mm Edwards S3U via  the subclavian approach with Dr. Angelena Form & Dr. Cyndia Bent    Severe aortic stenosis     Past Surgical History:  Procedure Laterality Date   ATRIAL FLUTTER ABLATION N/A 02/07/2011   Procedure: ATRIAL FLUTTER ABLATION;  Surgeon: Evans Lance, MD;  Location: [facility] CATH LAB;  Service: Cardiovascular;  Laterality: N/A;   BIV UPGRADE N/A 06/08/2020   Procedure: BIV PPM UPGRADE;  Surgeon: Evans Lance, MD;  Location: [place] CV LAB;  Service: Cardiovascular;  Laterality: N/A;   CARDIAC ELECTROPHYSIOLOGY STUDY AND ABLATION  02/07/2011   CARDIOVERSION N/A 03/23/2016   Procedure: CARDIOVERSION;  Surgeon: Evans Lance, MD;  Location: [place];  Service: Cardiovascular;  Laterality: N/A;   COLONOSCOPY N/A 04/17/2017   Rehman: 3 polyps in [facility], diverticulosis   COLONOSCOPY N/A 02/23/2021   Procedure: COLONOSCOPY;  Surgeon: Rogene Houston, MD;  [facility];  Service: Endoscopy;  Laterality: N/A;  12:15   EP IMPLANTABLE DEVICE N/A 08/29/2015   Procedure: Pacemaker Implant;  Surgeon: Evans Lance, MD;  Location: [place] CV LAB;  Service: Cardiovascular;  Laterality: N/A;   EP IMPLANTABLE DEVICE N/A 12/07/2015   Procedure: PPM Lead Revision/Repair;  Surgeon: Evans Lance, MD;  Location: [place] CV LAB;  Service: Cardiovascular;  Laterality: N/A;   ESOPHAGOGASTRODUODENOSCOPY (EGD) WITH PROPOFOL N/A 10/18/2020   Castaneda: normal esophagus, erythematous mucosa in gastric  body, non bleeding duodenal ulcer with a non bleeding visible vessel (forrest Class IIa). Injected, clip placed, no specimens collected.   ESOPHAGOGASTRODUODENOSCOPY (EGD) WITH PROPOFOL N/A 01/27/2021   Procedure: ESOPHAGOGASTRODUODENOSCOPY (EGD) WITH PROPOFOL;  Surgeon: [name], DO;  [facility];  Service: Endoscopy;  Laterality: N/A;   ESOPHAGOGASTRODUODENOSCOPY (EGD) WITH PROPOFOL N/A 02/09/2021   Procedure: ESOPHAGOGASTRODUODENOSCOPY (EGD) WITH PROPOFOL;  Surgeon: [name], DO;   [facility];  Service: Endoscopy;  Laterality: N/A;   GIVENS CAPSULE STUDY N/A 02/07/2021   Procedure: GIVENS CAPSULE STUDY;  Surgeon: Harvel Quale, MD;  [facility];  Service: Gastroenterology;  Laterality: N/A;   IR ANGIOGRAM SELECTIVE EACH ADDITIONAL VESSEL  10/19/2020   IR ANGIOGRAM SELECTIVE EACH ADDITIONAL VESSEL  10/19/2020   IR ANGIOGRAM VISCERAL SELECTIVE  10/19/2020   IR ANGIOGRAM VISCERAL SELECTIVE  10/19/2020   IR EMBO ART  VEN HEMORR LYMPH EXTRAV  INC GUIDE ROADMAPPING  10/19/2020   IR US GUIDE VASC ACCESS RIGHT  10/19/2020   KNEE ARTHROSCOPY  03/27/2003   left   LUMBAR SPINE SURGERY     "I've had 6 ORs 1972 thru 2004"   POLYPECTOMY  04/17/2017   Procedure: POLYPECTOMY;  Surgeon: Rogene Houston, MD;  [facility];  Service: Endoscopy;;  transverse colon x3;   RIGHT HEART CATH N/A 04/28/2020   Procedure: RIGHT HEART CATH;  Surgeon: Jolaine Artist, MD;  Location: [place] CV LAB;  Service: Cardiovascular;  Laterality: N/A;   RIGHT/LEFT HEART CATH AND CORONARY ANGIOGRAPHY N/A 12/21/2019   Procedure: RIGHT/LEFT HEART CATH AND CORONARY ANGIOGRAPHY;  Surgeon: Nelva Bush, MD;  Location: [place] CV LAB;  Service: Cardiovascular;  Laterality: N/A;   TEE WITHOUT CARDIOVERSION N/A 11/05/2019   Procedure: TRANSESOPHAGEAL ECHOCARDIOGRAM (TEE) WITH PROPOFOL;  Surgeon: Arnoldo Lenis, MD;  [facility];  Service: Endoscopy;  Laterality: N/A;   TEE WITHOUT CARDIOVERSION N/A 05/03/2020   Procedure: TRANSESOPHAGEAL ECHOCARDIOGRAM (TEE);  Surgeon: Burnell Blanks, MD;  Location: [place];  Service: Open Heart Surgery;  Laterality: N/A;    Current Medications: Current Meds  Medication Sig   ACCU-CHEK AVIVA PLUS test strip TEST BLOOD SUGAR TWICE DAILY BEFORE BREAKFAST AND AT BEDTIME   acetaminophen (TYLENOL) 325 MG tablet Take 2 tablets (650 mg total) by mouth every 4 (four) hours as needed for headache or  mild pain.   allopurinol (ZYLOPRIM) 300 MG tablet Take 150 mg by mouth as needed.   aspirin EC 81 MG tablet Take 1 tablet (81 mg total) by mouth daily with breakfast.   benzonatate (TESSALON) 100 MG capsule Take 1 capsule (100 mg total) by mouth 3 (three) times daily as needed for cough.   camphor-menthol (ANTI-ITCH) lotion Apply 1 application topically daily as needed for itching.   carvedilol (COREG) 3.125 MG tablet Take 1 tablet (3.125 mg total) by mouth 2 (two) times daily with a meal.   colchicine 0.6 MG tablet Take 1 tablet (0.6 mg total) by mouth daily as needed.   Dulaglutide (TRULICITY) 1.5 ZO/1.0RU SOPN INJECT 1.5MG (1 PEN) SUBCUTANEOUSLY EVERY WEEK   hydrocortisone cream 1 % Apply 1 application topically daily.   insulin degludec (TRESIBA FLEXTOUCH) 100 UNIT/ML FlexTouch Pen Inject 12 Units into the skin at bedtime.   Insulin Pen Needle (BD PEN NEEDLE NANO U/F) 32G X 4 MM MISC 1 each by Does not apply route 4 (four) times daily.   linaclotide (LINZESS) 145 MCG CAPS capsule Take 1  capsule (145 mcg total) by mouth daily before breakfast.   metolazone (ZAROXOLYN) 2.5 MG tablet Take 1 tablet (2.5 mg total) by mouth once a week. Every Monday   Multiple Vitamins-Minerals (ONE-A-DAY MENS 50+) TABS Take 1 tablet by mouth daily with breakfast. Men   pantoprazole (PROTONIX) 40 MG tablet Take 1 tablet (40 mg total) by mouth daily.   spironolactone (ALDACTONE) 25 MG tablet Take 0.5 tablets (12.5 mg total) by mouth daily.   [DISCONTINUED] potassium chloride SA (KLOR-CON M20) 20 MEQ tablet Take 1.5 tablets (30 mEq total) by mouth 2 (two) times daily. Take 2 extra tabs on Mon with metolazone   [DISCONTINUED] torsemide (DEMADEX) 20 MG tablet Take 2 tablets (40 mg total) by mouth 2 (two) times daily.     Allergies:   Patient has no known allergies.   Social History   Socioeconomic History   Marital status: Married    Spouse name: Not on file   Number of children: 2   Years of education: Not on  file   Highest education level: Not on file  Occupational History   Occupation: Designer, industrial/product    Employer: RETIRED  Tobacco Use   Smoking status: Former    Packs/day: 1.00    Years: 10.00    Pack years: 10.00    Types: Cigarettes    Quit date: 03/26/1986    Years since quitting: 35.0   Smokeless tobacco: Never   Tobacco comments:    "stopped cigarette  smoking 1988"  Vaping Use   Vaping Use: Never used  Substance and Sexual Activity   Alcohol use: Not Currently    Alcohol/week: 0.0 standard drinks    Comment: "quit alcohol ~ 2007"   Drug use: No   Sexual activity: Yes    Partners: Female  Other Topics Concern   Not on file  Social History Narrative   Not on file   Social Determinants of Health   Financial Resource Strain: Not on file  Food Insecurity: No Food Insecurity   Worried About [place] in the Last Year: Never true   [place] in the Last Year: Never true  Transportation Needs: No Transportation Needs   Lack of Transportation (Medical): No   Lack of Transportation (Non-Medical): No  Physical Activity: Not on file  Stress: Not on file  Social Connections: Not on file     Family History: The patient's family history includes Heart attack in his brother, father, and mother; Hypertension in his father and mother. There is no history of Colon cancer.  ROS:   Please see the history of present illness.    All other systems reviewed and are negative.  EKGs/Labs/Other Studies Reviewed:    The following studies were reviewed today:  01/01/2021 Echo EF 30 RV low normal Severely dilated LA/RA Mod MR Mod TR  November/December EGD/colonoscopy records reviewed.      Recent Labs: 02/08/2021: ALT 15; Magnesium 2.1 02/21/2021: B Natriuretic Peptide 1,988.6; Hemoglobin 9.2; Platelets 128 03/14/2021: BUN 37; Creatinine, Ser 2.40; Potassium 4.1; Sodium 134  Recent Lipid Panel    Component Value Date/Time   CHOL 178 08/12/2019 0809    CHOL 142 01/17/2018 0835   TRIG 100 08/12/2019 0809   HDL 51 08/12/2019 0809   HDL 53 01/17/2018 0835   CHOLHDL 3.5 08/12/2019 0809   VLDL 20 08/12/2019 0809   LDLCALC 107 (H) 08/12/2019 0809   LDLCALC 71 01/17/2018 0835    Physical Exam:    VS:  BP 126/74    Pulse 78    Ht 6' (1.829 m)    Wt 222 lb (100.7 kg)    SpO2 99%    BMI 30.11 kg/m     Wt Readings from Last 3 Encounters:  03/22/21 222 lb (100.7 kg)  02/23/21 204 lb (92.5 kg)  02/21/21 218 lb 12.8 oz (99.2 kg)     GEN: Elderly appearing male in no distress  HEENT: Normal NECK: No JVD; No carotid bruits LYMPHATICS: No lymphadenopathy CARDIAC: RRR, no murmurs, rubs, gallops.  2-3+ pitting bilateral lower extremity edema to the thighs.  Extremities are warm. RESPIRATORY:  Clear to auscultation without rales, wheezing or rhonchi  ABDOMEN: Soft, non-tender, non-distended MUSCULOSKELETAL:  No deformity  SKIN: Warm and dry NEUROLOGIC:  Alert and oriented x 3 PSYCHIATRIC:  Normal affect       ASSESSMENT:    1. Atypical atrial flutter (HCC)   2. Paroxysmal atrial fibrillation (HCC)    PLAN:    In order of problems listed above:  #Atrial fibrillation and flutter Not currently on anticoagulation because of a history of severe GI bleeding requiring transfusion.  He has a history of a large duodenal ulcer that is now healed on recent EGD.  Despite the healed ulcer, he had a recent admission for symptomatic anemia.  Pill endoscopy was performed but was not diagnostic.  At this time, I would recommend continuing aspirin 81 mg by mouth once daily for stroke prophylaxis.  I would not restart anticoagulation at this time given his recent history of symptomatic anemia requiring transfusion with an unclear source.  I will defer rechallenge him with anticoagulation to the gastroenterology team.  Unclear whether or not they are planning to repeat the pill endoscopy.  We discussed the watchman procedure during today's visit.  At  this time, he is not a candidate for left atrial appendage occlusion because of his decompensated heart failure and frailty.  We discussed this at length during today's appointment and they are very understanding.  I would focus our efforts collectively on optimizing his heart failure and volume status.  #Acute on chronic combined systolic and diastolic heart failure NYHA class III today.  Warm and volume overloaded on exam.  I will plan to increase his torsemide to 60 mg by mouth twice daily.  I will also increase his potassium supplementation to 40 mEq twice daily.  He should be seen in the next 7 to 10 days in the heart failure clinic for an exam and lab work.  Appointment with APP okay.  He should continue his other heart failure medications.  We discussed limiting his liquid intake.  We discussed elevating his legs while at rest.  I advised him that he can use his wedge that he has at home to achieve this.  He has compression stockings at home.  I discussed using those during the daytime hours and taking them off at night.  He should weigh himself daily.  He can follow-up with me on an as-needed basis.    Medication Adjustments/Labs and Tests Ordered: Current medicines are reviewed at length with the patient today.  Concerns regarding medicines are outlined above.  No orders of the defined types were placed in this encounter.  Meds ordered this encounter  Medications   torsemide (DEMADEX) 20 MG tablet    Sig: Take 3 tablets (60 mg total) by mouth 2 (two) times daily.    Dispense:  270 tablet    Refill:  3  potassium chloride SA (KLOR-CON M20) 20 MEQ tablet    Sig: Take 2 tablets (40 mEq total) by mouth 2 (two) times daily.    Dispense:  270 tablet    Refill:  3    Please cancel all previous orders for current medication. Change in dosage or pill size.     Signed, Hilton Cork. Quentin Ore, MD, [facility], [facility] 03/22/2021 12:54 PM    Electrophysiology [facility]

## 2021-03-22 ENCOUNTER — Encounter: Payer: Self-pay | Admitting: Cardiology

## 2021-03-22 ENCOUNTER — Ambulatory Visit (INDEPENDENT_AMBULATORY_CARE_PROVIDER_SITE_OTHER): Payer: Medicare HMO | Admitting: Cardiology

## 2021-03-22 ENCOUNTER — Ambulatory Visit: Payer: Medicare HMO | Admitting: Cardiology

## 2021-03-22 ENCOUNTER — Other Ambulatory Visit: Payer: Self-pay

## 2021-03-22 VITALS — BP 126/74 | HR 78 | Ht 72.0 in | Wt 222.0 lb

## 2021-03-22 DIAGNOSIS — I484 Atypical atrial flutter: Secondary | ICD-10-CM

## 2021-03-22 DIAGNOSIS — I5043 Acute on chronic combined systolic (congestive) and diastolic (congestive) heart failure: Secondary | ICD-10-CM

## 2021-03-22 DIAGNOSIS — I480 Paroxysmal atrial fibrillation: Secondary | ICD-10-CM | POA: Diagnosis not present

## 2021-03-22 MED ORDER — TORSEMIDE 20 MG PO TABS: 60.0000 mg | ORAL_TABLET | Freq: Two times a day (BID) | ORAL | 3 refills | Status: DC

## 2021-03-22 MED ORDER — POTASSIUM CHLORIDE CRYS ER 20 MEQ PO TBCR: 40.0000 meq | EXTENDED_RELEASE_TABLET | Freq: Two times a day (BID) | ORAL | 3 refills | Status: DC

## 2021-03-22 NOTE — Patient Instructions (Addendum)
Medication Instructions:  Your physician has recommended you make the following change in your medication:    INCREASE your torsemide 20 mg-  Take 3 tablets (60 mg) by mouth twice a day  2.  INCREASE your potassium 20 meq-  Take 2 tablets (40 meq) by mouth twice a day  Lab Work: None ordered. If you have labs (blood work) drawn today and your tests are completely normal, you will receive your results only by: [place] (if you have MyChart) OR A paper copy in the mail If you have any lab test that is abnormal or we need to change your treatment, we will call you to review the results.  Testing/Procedures: None ordered.  Follow-Up: At [facility], you and your health needs are our priority.  As part of our continuing mission to provide you with exceptional heart care, we have created designated Provider Care Teams.  These Care Teams include your primary Cardiologist (physician) and Advanced Practice Providers (APPs -  Physician Assistants and Nurse Practitioners) who all work together to provide you with the care you need, when you need it.  Your next appointment:   Your physician wants you to follow-up:  You will follow up with the Heart Failure clinic March 30, 2021 at 3:30 pm  The parking code is [id number]

## 2021-03-25 ENCOUNTER — Other Ambulatory Visit: Payer: Self-pay | Admitting: "Endocrinology

## 2021-03-28 ENCOUNTER — Other Ambulatory Visit: Payer: Self-pay | Admitting: "Endocrinology

## 2021-03-28 DIAGNOSIS — D649 Anemia, unspecified: Secondary | ICD-10-CM | POA: Diagnosis not present

## 2021-03-28 DIAGNOSIS — I5022 Chronic systolic (congestive) heart failure: Secondary | ICD-10-CM | POA: Diagnosis not present

## 2021-03-28 DIAGNOSIS — E1122 Type 2 diabetes mellitus with diabetic chronic kidney disease: Secondary | ICD-10-CM | POA: Diagnosis not present

## 2021-03-28 DIAGNOSIS — E559 Vitamin D deficiency, unspecified: Secondary | ICD-10-CM | POA: Diagnosis not present

## 2021-03-28 DIAGNOSIS — N189 Chronic kidney disease, unspecified: Secondary | ICD-10-CM | POA: Diagnosis not present

## 2021-03-28 DIAGNOSIS — I129 Hypertensive chronic kidney disease with stage 1 through stage 4 chronic kidney disease, or unspecified chronic kidney disease: Secondary | ICD-10-CM | POA: Diagnosis not present

## 2021-03-28 NOTE — Telephone Encounter (Signed)
Insurance will NOT cover this medication.

## 2021-03-28 NOTE — Telephone Encounter (Signed)
I have been out of the office since 12/23. Pt has appt scheduled.

## 2021-03-28 NOTE — Telephone Encounter (Signed)
Last OV 02/01/2021  Next OV 07/09/9731  Trulicity 1.5 is Not covered by patients insurance. Please prescribe which one you would like to try for patient.

## 2021-03-29 NOTE — Progress Notes (Signed)
ADVANCED HF CLINIC NOTE  Primary Care: [name], MD Primary Cardiologist: Dr. Harl Bowie  HF Cardiologist: Dr. Haroldine Laws  Reason for Visit: Chronic Systolic Heart Failure   HPI: [name] is a 76 y.o. male w/ systolic HF due to NICM (cath 9/21 no CAD), h/o CHB s/p PPM 2017,  HTN, DM2, atrial flutter, stage 3b CKD and severe AS s/p TAVR 2/22.Marland Kitchen   Echo 7/21 EF 30-35%. RV mildly reduced. Mod-sev MR Moderate AS  TEE 8/21 moderate MR and moderate TR. AoV severely thickened/heavily calcified  [facility] 9/21 No CAD. Moderately elevated filling pressures and moderately reduced CO. CI 1.8. Also concern for RV pacing CM with 42% RV pacing  Several admits in 12/21 for ADHF.   Admitted 2/22 for severe HF and TAVR. Started on dobutamine support. Underwent TVRF 05/03/20. Diuresed and DBA weaned off.   Attempted CRT upgrade 3/22. Left subclavian vein occluded.  Echo 3/22: EF 25-30% moderate MR. TAVR stable  Admitted 7/22 for anemia and hyponatremia. Hgb 14 -> 7.3  got 2u RBCs. EGD with duodenal ulcer with visible vessel so Eliquis held. Could not adequately clip so underwent IR embolization.  Weekly metolazone restarted 9/22.  Admitted 10/8-10/10/22 for A/C CHF. He was diuresed with IV lasix + metolazone. Hospitalization c/b AKI on CKD3. Discharge weight 209 lbs.  Follow up 10/22 weight up 13 lbs in setting of being out of metolazone x 2 weeks. Instructed to take metolazone for 2 days then resume weekly.  Admitted 11/3-11/6/22 w/ acute diverticulitis. GI consulted and underwent EGD 11/4 showing nonobstructive schatzki ring; portal HTN gastropathy; & healed ulcer in duodenal bulb without stigmata of bleeding. Received 4 U PRBCs during hospitalization. He remained DNR.  Admitted 11/22 with GIB, hgb 6.4. GI consulted and had capsule endoscopy 02/07/21 showing old blood in the stomach but no new findings.Received 4 U PRBCs and octreotide, GI plans for outpatient colonoscopy. Cards consulted after he  became hypotensive requiring NE. Diuretics held with bump in SCr, but torsemide 20 mg bid resumed at discharge. Discharge weight 215 lbs, hgb 8.5.  Post hospital follow up weight 214 lbs, ReDs 37% on torsemide 40 bid and metolazone 2.5 once a week. Breathing better with minimal LE swelling.   Called office 03/22/21  or increased swelling and weight gain, and instructed to increase torsemide to 60 mg bid and take metolazone for 2 days. Saw Dr. Quentin Ore 12/28 for Watchman consideration, volume still up.   Today he returns for an acute visit with his wife with worsening SOB and leg swelling. He saw Nephrology today and torsemide increased to 100 mg q AM/50 mg q PM. Weight up about 15-18 lbs, wife gives him an extra metolazone if swelling gets bad. Legs are weeping. He remains SOB with minimal activity and is now sleeping propped up. Denies CP, dizziness, or abnormal bleeding. Appetite ok. No fever or chills. Weight at home 226 pounds, baseline ~ 209 lbs. Wife helps him with compression socks daily but having trouble getting them on.   Past Medical History:  Diagnosis Date   Atrial flutter ([place]) 12/2010   Admitted with symptomatic bradycardia (HR 40s), atrial flutter with slow ventricular response 12/2010 + volume overload; AV nodal agents d/c'd and Pradaxa started; RFA in 01/2011   Chronic kidney disease, stage 3b (HCC)    Chronic systolic CHF (congestive heart failure) (HCC)    Class 2 severe obesity due to excess calories with serious comorbidity and body mass index (BMI) of 37.0 to 37.9 in adult (  [place]) 07/17/2017   Diabetes mellitus    non insulin dependant   Hematoma, chest wall 05/15/2020   Hyperlipidemia    Hypertension    Mitral regurgitation    NASH (nonalcoholic steatohepatitis)    NICM (nonischemic cardiomyopathy) (HCC)    Osteoarthritis    Polyarticular gout 05/15/2020   Presence of permanent cardiac pacemaker    PSVT (paroxysmal supraventricular tachycardia) (HCC)    Possibly atrial  flutter   S/P TAVR (transcatheter aortic valve replacement) 05/03/2020   s/p TAVR with a 29 mm Edwards S3U via the subclavian approach with Dr. Angelena Form & Dr. Cyndia Bent    Severe aortic stenosis    Current Outpatient Medications  Medication Sig Dispense Refill   acetaminophen (TYLENOL) 325 MG tablet Take 2 tablets (650 mg total) by mouth every 4 (four) hours as needed for headache or mild pain.     allopurinol (ZYLOPRIM) 300 MG tablet Take 150 mg by mouth as needed.     aspirin EC 81 MG tablet Take 1 tablet (81 mg total) by mouth daily with breakfast. 30 tablet 2   benzonatate (TESSALON) 100 MG capsule Take 1 capsule (100 mg total) by mouth 3 (three) times daily as needed for cough. 90 capsule 11   camphor-menthol (ANTI-ITCH) lotion Apply 1 application topically daily as needed for itching.     carvedilol (COREG) 3.125 MG tablet Take 1 tablet (3.125 mg total) by mouth 2 (two) times daily with a meal. 60 tablet 4   colchicine 0.6 MG tablet Take 1 tablet (0.6 mg total) by mouth daily as needed. 30 tablet 6   hydrocortisone cream 1 % Apply 1 application topically daily.     insulin degludec (TRESIBA FLEXTOUCH) 100 UNIT/ML FlexTouch Pen Inject 12 Units into the skin at bedtime. 15 mL 2   Insulin Pen Needle (BD PEN NEEDLE NANO U/F) 32G X 4 MM MISC 1 each by Does not apply route 4 (four) times daily. 150 each 5   linaclotide (LINZESS) 145 MCG CAPS capsule Take 1 capsule (145 mcg total) by mouth daily before breakfast. 90 capsule 3   metolazone (ZAROXOLYN) 2.5 MG tablet Take 1 tablet (2.5 mg total) by mouth once a week. Every Monday 5 tablet 4   Multiple Vitamins-Minerals (ONE-A-DAY MENS 50+) TABS Take 1 tablet by mouth daily with breakfast. Men     pantoprazole (PROTONIX) 40 MG tablet Take 1 tablet (40 mg total) by mouth daily. 60 tablet 1   potassium chloride SA (KLOR-CON M20) 20 MEQ tablet Take 2 tablets (40 mEq total) by mouth 2 (two) times daily. 270 tablet 3   spironolactone (ALDACTONE) 25 MG  tablet Take 0.5 tablets (12.5 mg total) by mouth daily. 15 tablet 6   torsemide (DEMADEX) 20 MG tablet Take 3 tablets (60 mg total) by mouth 2 (two) times daily. 401 tablet 3   TRULICITY 1.5 UU/7.2ZD SOPN INJECT 1.5MG (1 PEN) SUBCUTANEOUSLY EVERY WEEK 2 mL 2   ACCU-CHEK AVIVA PLUS test strip TEST BLOOD SUGAR TWICE DAILY BEFORE BREAKFAST AND AT BEDTIME 200 strip 1   No current facility-administered medications for this encounter.   No Known Allergies  Social History   Socioeconomic History   Marital status: Married    Spouse name: Not on file   Number of children: 2   Years of education: Not on file   Highest education level: Not on file  Occupational History   Occupation: Designer, industrial/product    Employer: RETIRED  Tobacco Use   Smoking status: Former  Packs/day: 1.00    Years: 10.00    Pack years: 10.00    Types: Cigarettes    Quit date: 03/26/1986    Years since quitting: 35.0   Smokeless tobacco: Never   Tobacco comments:    "stopped cigarette  smoking 1988"  Vaping Use   Vaping Use: Never used  Substance and Sexual Activity   Alcohol use: Not Currently    Alcohol/week: 0.0 standard drinks    Comment: "quit alcohol ~ 2007"   Drug use: No   Sexual activity: Yes    Partners: Female  Other Topics Concern   Not on file  Social History Narrative   Not on file   Social Determinants of Health   Financial Resource Strain: Not on file  Food Insecurity: No Food Insecurity   Worried About Running Out of Food in the Last Year: Never true   Ran Out of Food in the Last Year: Never true  Transportation Needs: No Transportation Needs   Lack of Transportation (Medical): No   Lack of Transportation (Non-Medical): No  Physical Activity: Not on file  Stress: Not on file  Social Connections: Not on file  Intimate Partner Violence: Not on file   Family History  Problem Relation Age of Onset   Heart attack Mother    Hypertension Mother    Heart attack Father    Hypertension  Father    Heart attack Brother    Colon cancer Neg Hx    BP 130/76    Pulse 72    Ht 6' (1.829 m)    Wt 102.9 kg (226 lb 12.8 oz)    SpO2 97%    BMI 30.76 kg/m   Wt Readings from Last 3 Encounters:  03/30/21 102.9 kg (226 lb 12.8 oz)  03/22/21 100.7 kg (222 lb)  02/23/21 92.5 kg (204 lb)   PHYSICAL EXAM: General:  NAD. Elderly, arrived in [facility], chronically-ill appearing. HEENT: Normal Neck: Supple. JVP to jaw, + v waves. Carotids 2+ bilat; no bruits. No lymphadenopathy or thryomegaly appreciated. Cor: PMI nondisplaced. Irregular rate & rhythm. No rubs, gallops or murmurs. Lungs: Clear Abdomen: + distended, nontender. No hepatosplenomegaly. No bruits or masses. Good bowel sounds. Extremities: No cyanosis, clubbing, rash, 3-4+ BLE edema to thighs, open and weeping area to right upper leg. Neuro: Alert & oriented x 3, cranial nerves grossly intact. Moves all 4 extremities w/o difficulty. Affect pleasant.  ReDs: 44%  ASSESSMENT & PLAN: 1. Acute Systolic Heart Failure/NICM: - Echo 08/2015 EF 50-55% (PPM placed in 08/2015)  - Echo 10/2015 EF dropped to 40-45% - Echo 2018 EF 45% - Echo 2021 EF 30-35%, RV mildly reduced  - Echo 3/22 EF 25-30% TAVR ok. Mod MR .-LHC 9/21 that showed no coronary disease.  - based on timing of drop in EF and 42% pacing percentage, concern for RV paced CM +AS. - Left subclavian vein occluded -> unable to upgrade to CRT. - s/p TAVR 2/22 - NYHA IIIb-early IV. He is markedly volume overloaded today, weight up ~15-18 lbs, ReDs 44%. - Arrange for [employer] with [name] (can be done ~ 48 hours). - I personally wrapped his legs with kerlex + Ace wraps in clinic today, bilateral DPs palpable. - Take torsemide 100 mg + metolazone 10 mg tonight. - If he does not have significant diuresis by the morning, instructed to go to the ED. - Continue spiro 12.5 mg daily.  - Continue carvedilol 3.125 mg bid. - Off Farxiga due to yeast. - Labs  from Nephrology 03/28/21 show SCr  2.75, K 4.6.  2. Stage IIIb CKD - Followed by nephrology, Dr. Theador Hawthorne.   - SCr worsening, most recent 2.76. - Volume becoming more difficult to control, likely will need discussion regarding dialysis sooner rather than later.  3. Mitral Regurgitation  - Moderate by TEE 8/21 and Echo 3/22. - Likely functional from dilated CM.  - Will follow.  4. Aortic Stenosis  - s/p TAVR 3/22. - Stable on echo.   5. Atrial Flutter/ Chronic Atrial Fibrillation - s/p AFL ablation.  - In chronic Afib, rate controlled.  - s/p frequent GI bleed so off Eliquis.  - CHA2DS2-VASc Score = at least 5  - Unable to safely restart Eliquis at this time.  - EP felt not a Watchman candidate.  6. T2DM - On insulin. - Off Farxiga due to yeast.  7. H/o CHB  - s/p PPM followed by Dr. Lovena Le  - ? RV paced CM. He's pacing < 10%. Had drop in EF 2 months after pacer was placed.  - Following with Dr.Taylor. Attempted CRT upgrade 3/22 but left subclavian vein occluded.  8. UGIB - EGD in 7/22 with duodenal ulcer with visible vessel. Underwent coiling of GDA.  - s/p EGD 11/22 with no active bleed; received 4 U PRBCs. - No further bleeding. Has follow up with GI.  Discussed with Dr. Haroldine Laws. He is markedly volume overloaded. We discussed going to ED. He would like to wait and try torsemide + metolazone tonight to see if he can get improvement in symptoms. If no improvement soon, he will need to be admitted.  If he has diuresed and feels better tomorrow, instructed wife to call clinic and we can give him further mediation instructions (favor torsemide 100 q AM/50 q PM and metolazone 5 mg twice a week). Will need close follow up.  John Bihari, FNP  4:06 PM  Patient seen and examined with the above-signed Advanced Practice Provider and/or Housestaff. I personally reviewed laboratory data, imaging studies and relevant notes. I independently examined the patient and formulated the important aspects of the plan. I  have edited the note to reflect any of my changes or salient points. I have personally discussed the plan with the patient and/or family.  He remains markedly volume overloaded in setting of advanced cardiorenal syndrome with R>>L HF symptoms. Only modest response to po diuretics.   General:  Elderly  No resp difficulty HEENT: normal Neck: supple. JVP to ear  Carotids 2+ bilat; no bruits. No lymphadenopathy or thryomegaly appreciated. Cor: PMI nondisplaced. Regular rate & rhythm. No rubs, gallops or murmurs. Lungs: clear Abdomen: soft, nontender, + distended. No hepatosplenomegaly. No bruits or masses. Good bowel sounds. Extremities: no cyanosis, clubbing, rash, 3-4+ edema into thighs with some weeping areas  Neuro: alert & orientedx3, cranial nerves grossly intact. moves all 4 extremities w/o difficulty. Affect pleasant  As above, we have suggested admission for IV diuresis but he would like to try augmented outpatient therapy first. Give metolazone 27m tonight, if no significant response he will need admission for attempt at IV diuresis. Also discussed possible impending need for dialysis to maintain volume status.   Total time spent 45 minutes. Over half that time spent discussing above.   DGlori Bickers MD  11:08 PM

## 2021-03-29 NOTE — Telephone Encounter (Signed)
Called patient and gave him the message. Patient verbalized an understanding and will call if he needs to.

## 2021-03-30 ENCOUNTER — Ambulatory Visit (HOSPITAL_BASED_OUTPATIENT_CLINIC_OR_DEPARTMENT_OTHER)
Admission: RE | Admit: 2021-03-30 | Discharge: 2021-03-30 | Disposition: A | Discharge status: Home / Self Care | Payer: Medicare HMO | Source: Ambulatory Visit | Attending: Family Medicine | Admitting: Family Medicine

## 2021-03-30 ENCOUNTER — Other Ambulatory Visit: Payer: Self-pay

## 2021-03-30 VITALS — BP 130/76 | HR 72 | Ht 72.0 in | Wt 226.8 lb

## 2021-03-30 DIAGNOSIS — I480 Paroxysmal atrial fibrillation: Secondary | ICD-10-CM | POA: Diagnosis present

## 2021-03-30 DIAGNOSIS — I517 Cardiomegaly: Secondary | ICD-10-CM | POA: Diagnosis not present

## 2021-03-30 DIAGNOSIS — R0602 Shortness of breath: Secondary | ICD-10-CM | POA: Diagnosis not present

## 2021-03-30 DIAGNOSIS — Z950 Presence of cardiac pacemaker: Secondary | ICD-10-CM | POA: Diagnosis not present

## 2021-03-30 DIAGNOSIS — N189 Chronic kidney disease, unspecified: Secondary | ICD-10-CM | POA: Diagnosis not present

## 2021-03-30 DIAGNOSIS — D62 Acute posthemorrhagic anemia: Secondary | ICD-10-CM | POA: Diagnosis not present

## 2021-03-30 DIAGNOSIS — I482 Chronic atrial fibrillation, unspecified: Secondary | ICD-10-CM | POA: Diagnosis not present

## 2021-03-30 DIAGNOSIS — E782 Mixed hyperlipidemia: Secondary | ICD-10-CM | POA: Diagnosis not present

## 2021-03-30 DIAGNOSIS — Z952 Presence of prosthetic heart valve: Secondary | ICD-10-CM

## 2021-03-30 DIAGNOSIS — I420 Dilated cardiomyopathy: Secondary | ICD-10-CM | POA: Diagnosis not present

## 2021-03-30 DIAGNOSIS — R0989 Other specified symptoms and signs involving the circulatory and respiratory systems: Secondary | ICD-10-CM | POA: Diagnosis not present

## 2021-03-30 DIAGNOSIS — D631 Anemia in chronic kidney disease: Secondary | ICD-10-CM | POA: Diagnosis not present

## 2021-03-30 DIAGNOSIS — N1832 Chronic kidney disease, stage 3b: Secondary | ICD-10-CM | POA: Diagnosis not present

## 2021-03-30 DIAGNOSIS — M7989 Other specified soft tissue disorders: Secondary | ICD-10-CM | POA: Diagnosis not present

## 2021-03-30 DIAGNOSIS — K7581 Nonalcoholic steatohepatitis (NASH): Secondary | ICD-10-CM | POA: Diagnosis not present

## 2021-03-30 DIAGNOSIS — I340 Nonrheumatic mitral (valve) insufficiency: Secondary | ICD-10-CM | POA: Diagnosis not present

## 2021-03-30 DIAGNOSIS — I442 Atrioventricular block, complete: Secondary | ICD-10-CM | POA: Diagnosis not present

## 2021-03-30 DIAGNOSIS — M109 Gout, unspecified: Secondary | ICD-10-CM | POA: Diagnosis present

## 2021-03-30 DIAGNOSIS — I130 Hypertensive heart and chronic kidney disease with heart failure and stage 1 through stage 4 chronic kidney disease, or unspecified chronic kidney disease: Secondary | ICD-10-CM | POA: Diagnosis not present

## 2021-03-30 DIAGNOSIS — I5043 Acute on chronic combined systolic (congestive) and diastolic (congestive) heart failure: Secondary | ICD-10-CM | POA: Diagnosis not present

## 2021-03-30 DIAGNOSIS — Z20822 Contact with and (suspected) exposure to covid-19: Secondary | ICD-10-CM | POA: Diagnosis not present

## 2021-03-30 DIAGNOSIS — R600 Localized edema: Secondary | ICD-10-CM | POA: Diagnosis not present

## 2021-03-30 DIAGNOSIS — I110 Hypertensive heart disease with heart failure: Secondary | ICD-10-CM | POA: Diagnosis not present

## 2021-03-30 DIAGNOSIS — E871 Hypo-osmolality and hyponatremia: Secondary | ICD-10-CM | POA: Diagnosis not present

## 2021-03-30 DIAGNOSIS — I5021 Acute systolic (congestive) heart failure: Secondary | ICD-10-CM | POA: Diagnosis not present

## 2021-03-30 DIAGNOSIS — I350 Nonrheumatic aortic (valve) stenosis: Secondary | ICD-10-CM | POA: Diagnosis not present

## 2021-03-30 DIAGNOSIS — I509 Heart failure, unspecified: Secondary | ICD-10-CM | POA: Diagnosis not present

## 2021-03-30 DIAGNOSIS — E1165 Type 2 diabetes mellitus with hyperglycemia: Secondary | ICD-10-CM | POA: Diagnosis not present

## 2021-03-30 DIAGNOSIS — M1990 Unspecified osteoarthritis, unspecified site: Secondary | ICD-10-CM | POA: Diagnosis present

## 2021-03-30 DIAGNOSIS — K219 Gastro-esophageal reflux disease without esophagitis: Secondary | ICD-10-CM | POA: Diagnosis not present

## 2021-03-30 DIAGNOSIS — D509 Iron deficiency anemia, unspecified: Secondary | ICD-10-CM | POA: Diagnosis present

## 2021-03-30 DIAGNOSIS — K3189 Other diseases of stomach and duodenum: Secondary | ICD-10-CM | POA: Diagnosis not present

## 2021-03-30 DIAGNOSIS — D649 Anemia, unspecified: Secondary | ICD-10-CM | POA: Diagnosis not present

## 2021-03-30 DIAGNOSIS — K222 Esophageal obstruction: Secondary | ICD-10-CM | POA: Diagnosis not present

## 2021-03-30 DIAGNOSIS — E1159 Type 2 diabetes mellitus with other circulatory complications: Secondary | ICD-10-CM | POA: Diagnosis not present

## 2021-03-30 DIAGNOSIS — J9811 Atelectasis: Secondary | ICD-10-CM | POA: Diagnosis not present

## 2021-03-30 DIAGNOSIS — I5023 Acute on chronic systolic (congestive) heart failure: Secondary | ICD-10-CM | POA: Diagnosis not present

## 2021-03-30 DIAGNOSIS — N170 Acute kidney failure with tubular necrosis: Secondary | ICD-10-CM | POA: Diagnosis not present

## 2021-03-30 DIAGNOSIS — E559 Vitamin D deficiency, unspecified: Secondary | ICD-10-CM | POA: Diagnosis not present

## 2021-03-30 DIAGNOSIS — E8809 Other disorders of plasma-protein metabolism, not elsewhere classified: Secondary | ICD-10-CM | POA: Diagnosis not present

## 2021-03-30 DIAGNOSIS — I129 Hypertensive chronic kidney disease with stage 1 through stage 4 chronic kidney disease, or unspecified chronic kidney disease: Secondary | ICD-10-CM | POA: Diagnosis not present

## 2021-03-30 DIAGNOSIS — J811 Chronic pulmonary edema: Secondary | ICD-10-CM | POA: Diagnosis not present

## 2021-03-30 DIAGNOSIS — I4892 Unspecified atrial flutter: Secondary | ICD-10-CM | POA: Diagnosis not present

## 2021-03-30 DIAGNOSIS — I10 Essential (primary) hypertension: Secondary | ICD-10-CM | POA: Diagnosis not present

## 2021-03-30 DIAGNOSIS — D696 Thrombocytopenia, unspecified: Secondary | ICD-10-CM | POA: Diagnosis not present

## 2021-03-30 DIAGNOSIS — E1122 Type 2 diabetes mellitus with diabetic chronic kidney disease: Secondary | ICD-10-CM | POA: Diagnosis not present

## 2021-03-30 DIAGNOSIS — K5732 Diverticulitis of large intestine without perforation or abscess without bleeding: Secondary | ICD-10-CM | POA: Diagnosis not present

## 2021-03-30 MED ORDER — METOLAZONE 5 MG PO TABS: 5.0000 mg | ORAL_TABLET | Freq: Every day | ORAL | 0 refills | Status: DC

## 2021-03-30 NOTE — Patient Instructions (Addendum)
Thank you for coming in today  TAKE 100 mg of torsemide and 10 mg 2 tablets of Metolazone TOGETHER TONIGHT  GO TO  [place] EMERGENCY ROOM TOMORROW 03/31/2021 IF YOU HAVE NOT URINATED ENOUGH  At the [facility], you and your health needs are our priority. As part of our continuing mission to provide you with exceptional heart care, we have created designated Provider Care Teams. These Care Teams include your primary Cardiologist (physician) and Advanced Practice Providers (APPs- Physician Assistants and Nurse Practitioners) who all work together to provide you with the care you need, when you need it.   You may see any of the following providers on your designated Care Team at your next follow up: Dr Glori Bickers Dr Haynes Kerns, NP Lyda Jester, [place] [facility] [place], [place] Audry Riles, PharmD   Please be sure to bring in all your medications bottles to every appointment.   If you have any questions or concerns before your next appointment please send [place] a message through [place] or call our office at [phone].    TO LEAVE A MESSAGE FOR THE NURSE SELECT OPTION 2, PLEASE LEAVE A MESSAGE INCLUDING: YOUR NAME DATE OF BIRTH CALL BACK NUMBER REASON FOR CALL**this is important as we prioritize the call backs  YOU WILL RECEIVE A CALL BACK THE SAME DAY AS LONG AS YOU CALL BEFORE 4:00 PM

## 2021-03-30 NOTE — Progress Notes (Signed)
ReDS Vest / Clip - 03/30/21 1548       ReDS Vest / Clip   Station Marker C    Ruler Value 25    ReDS Value Range High volume overload    ReDS Actual Value 44

## 2021-03-31 ENCOUNTER — Encounter (HOSPITAL_COMMUNITY): Payer: Self-pay

## 2021-03-31 ENCOUNTER — Emergency Department (HOSPITAL_COMMUNITY): Payer: Medicare HMO

## 2021-03-31 ENCOUNTER — Inpatient Hospital Stay (HOSPITAL_COMMUNITY)
Admission: EM | Admit: 2021-03-31 | Discharge: 2021-04-04 | DRG: 291 | Disposition: A | Discharge status: Home Health | Payer: Medicare HMO | Attending: Internal Medicine | Admitting: Internal Medicine

## 2021-03-31 ENCOUNTER — Other Ambulatory Visit: Payer: Self-pay

## 2021-03-31 DIAGNOSIS — E8809 Other disorders of plasma-protein metabolism, not elsewhere classified: Secondary | ICD-10-CM | POA: Diagnosis not present

## 2021-03-31 DIAGNOSIS — R600 Localized edema: Secondary | ICD-10-CM | POA: Diagnosis not present

## 2021-03-31 DIAGNOSIS — I480 Paroxysmal atrial fibrillation: Secondary | ICD-10-CM | POA: Diagnosis present

## 2021-03-31 DIAGNOSIS — I517 Cardiomegaly: Secondary | ICD-10-CM | POA: Diagnosis not present

## 2021-03-31 DIAGNOSIS — I340 Nonrheumatic mitral (valve) insufficiency: Secondary | ICD-10-CM | POA: Diagnosis present

## 2021-03-31 DIAGNOSIS — I442 Atrioventricular block, complete: Secondary | ICD-10-CM | POA: Diagnosis present

## 2021-03-31 DIAGNOSIS — D509 Iron deficiency anemia, unspecified: Secondary | ICD-10-CM | POA: Diagnosis present

## 2021-03-31 DIAGNOSIS — M109 Gout, unspecified: Secondary | ICD-10-CM | POA: Diagnosis present

## 2021-03-31 DIAGNOSIS — E782 Mixed hyperlipidemia: Secondary | ICD-10-CM | POA: Diagnosis present

## 2021-03-31 DIAGNOSIS — I420 Dilated cardiomyopathy: Secondary | ICD-10-CM | POA: Diagnosis present

## 2021-03-31 DIAGNOSIS — I10 Essential (primary) hypertension: Secondary | ICD-10-CM | POA: Diagnosis not present

## 2021-03-31 DIAGNOSIS — K219 Gastro-esophageal reflux disease without esophagitis: Secondary | ICD-10-CM | POA: Diagnosis present

## 2021-03-31 DIAGNOSIS — K7581 Nonalcoholic steatohepatitis (NASH): Secondary | ICD-10-CM | POA: Diagnosis present

## 2021-03-31 DIAGNOSIS — D696 Thrombocytopenia, unspecified: Secondary | ICD-10-CM | POA: Diagnosis present

## 2021-03-31 DIAGNOSIS — I5043 Acute on chronic combined systolic (congestive) and diastolic (congestive) heart failure: Secondary | ICD-10-CM | POA: Diagnosis present

## 2021-03-31 DIAGNOSIS — I5023 Acute on chronic systolic (congestive) heart failure: Secondary | ICD-10-CM | POA: Diagnosis not present

## 2021-03-31 DIAGNOSIS — Z79899 Other long term (current) drug therapy: Secondary | ICD-10-CM

## 2021-03-31 DIAGNOSIS — E1159 Type 2 diabetes mellitus with other circulatory complications: Secondary | ICD-10-CM | POA: Diagnosis present

## 2021-03-31 DIAGNOSIS — I509 Heart failure, unspecified: Secondary | ICD-10-CM | POA: Diagnosis not present

## 2021-03-31 DIAGNOSIS — I4892 Unspecified atrial flutter: Secondary | ICD-10-CM | POA: Diagnosis present

## 2021-03-31 DIAGNOSIS — N183 Chronic kidney disease, stage 3 unspecified: Secondary | ICD-10-CM | POA: Diagnosis present

## 2021-03-31 DIAGNOSIS — I130 Hypertensive heart and chronic kidney disease with heart failure and stage 1 through stage 4 chronic kidney disease, or unspecified chronic kidney disease: Secondary | ICD-10-CM | POA: Diagnosis present

## 2021-03-31 DIAGNOSIS — Z20822 Contact with and (suspected) exposure to covid-19: Secondary | ICD-10-CM | POA: Diagnosis present

## 2021-03-31 DIAGNOSIS — M1990 Unspecified osteoarthritis, unspecified site: Secondary | ICD-10-CM | POA: Diagnosis present

## 2021-03-31 DIAGNOSIS — R0602 Shortness of breath: Secondary | ICD-10-CM | POA: Diagnosis present

## 2021-03-31 DIAGNOSIS — N1832 Chronic kidney disease, stage 3b: Secondary | ICD-10-CM | POA: Diagnosis present

## 2021-03-31 DIAGNOSIS — Z950 Presence of cardiac pacemaker: Secondary | ICD-10-CM | POA: Diagnosis not present

## 2021-03-31 DIAGNOSIS — I482 Chronic atrial fibrillation, unspecified: Secondary | ICD-10-CM | POA: Diagnosis present

## 2021-03-31 DIAGNOSIS — E1165 Type 2 diabetes mellitus with hyperglycemia: Secondary | ICD-10-CM | POA: Diagnosis not present

## 2021-03-31 DIAGNOSIS — I350 Nonrheumatic aortic (valve) stenosis: Secondary | ICD-10-CM

## 2021-03-31 DIAGNOSIS — Z7982 Long term (current) use of aspirin: Secondary | ICD-10-CM

## 2021-03-31 DIAGNOSIS — Z8249 Family history of ischemic heart disease and other diseases of the circulatory system: Secondary | ICD-10-CM

## 2021-03-31 DIAGNOSIS — Z952 Presence of prosthetic heart valve: Secondary | ICD-10-CM

## 2021-03-31 DIAGNOSIS — E871 Hypo-osmolality and hyponatremia: Secondary | ICD-10-CM | POA: Diagnosis present

## 2021-03-31 DIAGNOSIS — E1122 Type 2 diabetes mellitus with diabetic chronic kidney disease: Secondary | ICD-10-CM | POA: Diagnosis present

## 2021-03-31 DIAGNOSIS — D631 Anemia in chronic kidney disease: Secondary | ICD-10-CM | POA: Diagnosis present

## 2021-03-31 DIAGNOSIS — I5021 Acute systolic (congestive) heart failure: Secondary | ICD-10-CM | POA: Diagnosis not present

## 2021-03-31 DIAGNOSIS — Z794 Long term (current) use of insulin: Secondary | ICD-10-CM

## 2021-03-31 LAB — COMPREHENSIVE METABOLIC PANEL
ALT: 15 U/L (ref 0–44)
AST: 32 U/L (ref 15–41)
Albumin: 3.2 g/dL — ABNORMAL LOW (ref 3.5–5.0)
Alkaline Phosphatase: 188 U/L — ABNORMAL HIGH (ref 38–126)
Anion gap: 9 (ref 5–15)
BUN: 42 mg/dL — ABNORMAL HIGH (ref 8–23)
CO2: 27 mmol/L (ref 22–32)
Calcium: 8.9 mg/dL (ref 8.9–10.3)
Chloride: 102 mmol/L (ref 98–111)
Creatinine, Ser: 2.25 mg/dL — ABNORMAL HIGH (ref 0.61–1.24)
GFR, Estimated: 30 mL/min — ABNORMAL LOW (ref >60)
Glucose, Bld: 69 mg/dL — ABNORMAL LOW (ref 70–99)
Potassium: 3.7 mmol/L (ref 3.5–5.1)
Sodium: 138 mmol/L (ref 135–145)
Total Bilirubin: 2.1 mg/dL — ABNORMAL HIGH (ref 0.3–1.2)
Total Protein: 7 g/dL (ref 6.5–8.1)

## 2021-03-31 LAB — CBC WITH DIFFERENTIAL/PLATELET
Abs Immature Granulocytes: 0.02 10*3/uL (ref 0.00–0.07)
Basophils Absolute: 0.1 10*3/uL (ref 0.0–0.1)
Basophils Relative: 1 %
Eosinophils Absolute: 0.4 10*3/uL (ref 0.0–0.5)
Eosinophils Relative: 7 %
HCT: 27.7 % — ABNORMAL LOW (ref 39.0–52.0)
Hemoglobin: 9 g/dL — ABNORMAL LOW (ref 13.0–17.0)
Immature Granulocytes: 0 %
Lymphocytes Relative: 15 %
Lymphs Abs: 0.8 10*3/uL (ref 0.7–4.0)
MCH: 26.5 pg (ref 26.0–34.0)
MCHC: 32.5 g/dL (ref 30.0–36.0)
MCV: 81.7 fL (ref 80.0–100.0)
Monocytes Absolute: 0.9 10*3/uL (ref 0.1–1.0)
Monocytes Relative: 17 %
Neutro Abs: 3.4 10*3/uL (ref 1.7–7.7)
Neutrophils Relative %: 60 %
Platelets: 129 10*3/uL — ABNORMAL LOW (ref 150–400)
RBC: 3.39 MIL/uL — ABNORMAL LOW (ref 4.22–5.81)
RDW: 23.1 % — ABNORMAL HIGH (ref 11.5–15.5)
WBC: 5.6 10*3/uL (ref 4.0–10.5)
nRBC: 0 % (ref 0.0–0.2)

## 2021-03-31 LAB — CBG MONITORING, ED: Glucose-Capillary: 152 mg/dL — ABNORMAL HIGH (ref 70–99)

## 2021-03-31 LAB — RESP PANEL BY RT-PCR (FLU A&B, COVID) ARPGX2
Influenza A by PCR: NEGATIVE
Influenza B by PCR: NEGATIVE
SARS Coronavirus 2 by RT PCR: NEGATIVE

## 2021-03-31 LAB — HEMOGLOBIN A1C
Hgb A1c MFr Bld: 6.2 % — ABNORMAL HIGH (ref 4.8–5.6)
Mean Plasma Glucose: 131.24 mg/dL

## 2021-03-31 LAB — TSH: TSH: 3.923 u[IU]/mL (ref 0.350–4.500)

## 2021-03-31 LAB — GLUCOSE, CAPILLARY: Glucose-Capillary: 109 mg/dL — ABNORMAL HIGH (ref 70–99)

## 2021-03-31 LAB — BRAIN NATRIURETIC PEPTIDE: B Natriuretic Peptide: 2067 pg/mL — ABNORMAL HIGH (ref 0.0–100.0)

## 2021-03-31 MED ORDER — INSULIN ASPART 100 UNIT/ML IJ SOLN
1.0000 [IU] | Freq: Three times a day (TID) | INTRAMUSCULAR | Status: DC
  Administered 2021-04-01: 1 [IU] via SUBCUTANEOUS

## 2021-03-31 MED ORDER — METOLAZONE 2.5 MG PO TABS
5.0000 mg | ORAL_TABLET | Freq: Once | ORAL | Status: AC
  Administered 2021-03-31: 5 mg via ORAL
  Filled 2021-03-31: qty 2

## 2021-03-31 MED ORDER — ONDANSETRON HCL 4 MG/2ML IJ SOLN: 4.0000 mg | Freq: Four times a day (QID) | INTRAMUSCULAR | Status: DC | PRN

## 2021-03-31 MED ORDER — INSULIN ASPART 100 UNIT/ML IJ SOLN: 0.0000 [IU] | Freq: Three times a day (TID) | INTRAMUSCULAR | Status: DC

## 2021-03-31 MED ORDER — LINACLOTIDE 145 MCG PO CAPS
145.0000 ug | ORAL_CAPSULE | Freq: Every day | ORAL | Status: DC
  Administered 2021-04-01 – 2021-04-04 (×4): 145 ug via ORAL
  Filled 2021-03-31 (×4): qty 1

## 2021-03-31 MED ORDER — SODIUM CHLORIDE 0.9% FLUSH: 3.0000 mL | INTRAVENOUS | Status: DC | PRN

## 2021-03-31 MED ORDER — INSULIN GLARGINE-YFGN 100 UNIT/ML ~~LOC~~ SOLN
8.0000 [IU] | Freq: Every day | SUBCUTANEOUS | Status: DC
  Administered 2021-03-31: 8 [IU] via SUBCUTANEOUS
  Filled 2021-03-31 (×4): qty 0.08

## 2021-03-31 MED ORDER — POTASSIUM CHLORIDE CRYS ER 20 MEQ PO TBCR
40.0000 meq | EXTENDED_RELEASE_TABLET | Freq: Two times a day (BID) | ORAL | Status: DC
  Administered 2021-03-31 – 2021-04-02 (×5): 40 meq via ORAL
  Filled 2021-03-31 (×5): qty 2

## 2021-03-31 MED ORDER — TRAZODONE HCL 50 MG PO TABS: 25.0000 mg | ORAL_TABLET | Freq: Every evening | ORAL | Status: DC | PRN

## 2021-03-31 MED ORDER — FUROSEMIDE 10 MG/ML IJ SOLN
80.0000 mg | Freq: Once | INTRAMUSCULAR | Status: AC
  Administered 2021-03-31: 80 mg via INTRAVENOUS
  Filled 2021-03-31: qty 8

## 2021-03-31 MED ORDER — METOLAZONE 5 MG PO TABS
5.0000 mg | ORAL_TABLET | Freq: Every day | ORAL | Status: DC
  Administered 2021-04-01 – 2021-04-02 (×2): 5 mg via ORAL
  Filled 2021-03-31 (×2): qty 1

## 2021-03-31 MED ORDER — ACETAMINOPHEN 325 MG PO TABS: 650.0000 mg | ORAL_TABLET | ORAL | Status: DC | PRN

## 2021-03-31 MED ORDER — PANTOPRAZOLE SODIUM 40 MG PO TBEC
40.0000 mg | DELAYED_RELEASE_TABLET | Freq: Every day | ORAL | Status: DC
  Administered 2021-03-31 – 2021-04-04 (×5): 40 mg via ORAL
  Filled 2021-03-31 (×5): qty 1

## 2021-03-31 MED ORDER — ASPIRIN EC 81 MG PO TBEC
81.0000 mg | DELAYED_RELEASE_TABLET | Freq: Every day | ORAL | Status: DC
  Administered 2021-04-01 – 2021-04-04 (×4): 81 mg via ORAL
  Filled 2021-03-31 (×4): qty 1

## 2021-03-31 MED ORDER — SODIUM CHLORIDE 0.9% FLUSH
3.0000 mL | Freq: Two times a day (BID) | INTRAVENOUS | Status: DC
  Administered 2021-03-31 – 2021-04-04 (×8): 3 mL via INTRAVENOUS

## 2021-03-31 MED ORDER — LORATADINE 10 MG PO TABS
10.0000 mg | ORAL_TABLET | Freq: Every day | ORAL | Status: DC | PRN
  Administered 2021-04-02: 10 mg via ORAL
  Filled 2021-03-31: qty 1

## 2021-03-31 MED ORDER — CARVEDILOL 3.125 MG PO TABS
3.1250 mg | ORAL_TABLET | Freq: Two times a day (BID) | ORAL | Status: DC
  Administered 2021-03-31 – 2021-04-04 (×8): 3.125 mg via ORAL
  Filled 2021-03-31 (×8): qty 1

## 2021-03-31 MED ORDER — FUROSEMIDE 10 MG/ML IJ SOLN
80.0000 mg | Freq: Two times a day (BID) | INTRAMUSCULAR | Status: AC
  Administered 2021-03-31 – 2021-04-03 (×7): 80 mg via INTRAVENOUS
  Filled 2021-03-31 (×7): qty 8

## 2021-03-31 MED ORDER — HEPARIN SODIUM (PORCINE) 5000 UNIT/ML IJ SOLN
5000.0000 [IU] | Freq: Three times a day (TID) | INTRAMUSCULAR | Status: DC
  Administered 2021-03-31 – 2021-04-04 (×12): 5000 [IU] via SUBCUTANEOUS
  Filled 2021-03-31 (×12): qty 1

## 2021-03-31 MED ORDER — METOLAZONE 2.5 MG PO TABS: 2.5000 mg | ORAL_TABLET | Freq: Once | ORAL | Status: DC

## 2021-03-31 MED ORDER — POTASSIUM CHLORIDE CRYS ER 20 MEQ PO TBCR
40.0000 meq | EXTENDED_RELEASE_TABLET | Freq: Two times a day (BID) | ORAL | Status: DC
  Filled 2021-03-31: qty 2

## 2021-03-31 MED ORDER — SODIUM CHLORIDE 0.9 % IV SOLN: 250.0000 mL | INTRAVENOUS | Status: DC | PRN

## 2021-03-31 MED ORDER — FUROSEMIDE 10 MG/ML IJ SOLN: 80.0000 mg | Freq: Two times a day (BID) | INTRAMUSCULAR | Status: DC

## 2021-03-31 NOTE — ED Provider Notes (Signed)
[facility] EMERGENCY DEPARTMENT Provider Note   CSN: [id number] Arrival date & time: 03/31/21  6578     History  Chief Complaint  Patient presents with   Leg Swelling    John [name] is a 76 y.o. male.  HPI  Patient with medical history including CHF systolic EF of 46% diabetes, stage III kidney disease, A. fib not on anticoagulant presents to the emergency department chief complaint of worsening leg swelling.  Patient states swelling has been going on for about a week's time, states he has worsening swelling in his lower legs, he denies worsening dyspnea on exertion, orthopnea, denies any chest pain or shortness of breath, worsening fatigue, fevers, chills or abdominal distention.  Patient states that he has gained approximately 15 to 18 pounds over the last week's time, his torsemide has been increased and he states he has been having decreasing urine output.  He denies any leaving aggravating factors.  At the reviewing patient's chart patient seen by his cardiologist yesterday his torsemide was increased to 100 mg, also taking metolazone, per his cardiologist who supposed to take increased Lasix but if things do not improve he supposed to come to the ED for further evaluation.  Home Medications Prior to Admission medications   Medication Sig Start Date End Date Taking? Authorizing Provider  acetaminophen (TYLENOL) 325 MG tablet Take 2 tablets (650 mg total) by mouth every 4 (four) hours as needed for headache or mild pain. 05/15/20  Yes Isaiah Serge, NP  allopurinol (ZYLOPRIM) 300 MG tablet Take 150 mg by mouth as needed (gout flare).   Yes [provider]  aspirin EC 81 MG tablet Take 1 tablet (81 mg total) by mouth daily with breakfast. 10/25/20 10/25/21 Yes Emokpae, Courage, MD  benzonatate (TESSALON) 100 MG capsule Take 1 capsule (100 mg total) by mouth 3 (three) times daily as needed for cough. 09/27/20  Yes Evans Lance, MD  camphor-menthol (ANTI-ITCH) lotion Apply  1 application topically daily as needed for itching.   Yes [provider]  carvedilol (COREG) 3.125 MG tablet Take 1 tablet (3.125 mg total) by mouth 2 (two) times daily with a meal. 01/30/21  Yes Milford, Jessica M, FNP  fexofenadine (ALLEGRA) 180 MG tablet Take 1 tablet by mouth daily.   Yes [provider]  hydrOXYzine (ATARAX) 10 MG tablet Take 10 mg by mouth 3 (three) times daily as needed for itching.  04/09/21 Yes [provider]  insulin degludec (TRESIBA FLEXTOUCH) 100 UNIT/ML FlexTouch Pen Inject 12 Units into the skin at bedtime. 02/13/21  Yes Johnson, Clanford L, MD  linaclotide (LINZESS) 145 MCG CAPS capsule Take 1 capsule (145 mcg total) by mouth daily before breakfast. 01/26/21  Yes Montez Morita, Daniel, MD  metolazone (ZAROXOLYN) 2.5 MG tablet Take 1 tablet (2.5 mg total) by mouth once a week. Every Monday 02/20/21  Yes Johnson, Clanford L, MD  Multiple Vitamins-Minerals (ONE-A-DAY MENS 50+) TABS Take 1 tablet by mouth daily with breakfast. Men   Yes [provider]  pantoprazole (PROTONIX) 40 MG tablet Take 1 tablet (40 mg total) by mouth daily. 02/12/21  Yes Johnson, Clanford L, MD  potassium chloride SA (KLOR-CON M20) 20 MEQ tablet Take 2 tablets (40 mEq total) by mouth 2 (two) times daily. 03/22/21  Yes Vickie Epley, MD  spironolactone (ALDACTONE) 25 MG tablet Take 0.5 tablets (12.5 mg total) by mouth daily. 02/13/21  Yes Johnson, Clanford L, MD  torsemide (DEMADEX) 20 MG tablet Take 3 tablets (  60 mg total) by mouth 2 (two) times daily. 03/22/21  Yes Vickie Epley, MD  TRULICITY 1.5 WL/8.9HT SOPN INJECT 1.5MG (1 PEN) SUBCUTANEOUSLY EVERY WEEK Patient taking differently: Inject 1.5 mg into the skin once a week. 03/28/21  Yes Nida, Marella Chimes, MD  ACCU-CHEK AVIVA PLUS test strip TEST BLOOD SUGAR TWICE DAILY BEFORE BREAKFAST AND AT BEDTIME 07/19/20   Cassandria Anger, MD  colchicine 0.6 MG tablet Take 1 tablet (0.6 mg total) by  mouth daily as needed. Patient not taking: Reported on 03/31/2021 05/15/20   Isaiah Serge, NP  Insulin Pen Needle (BD PEN NEEDLE NANO U/F) 32G X 4 MM MISC 1 each by Does not apply route 4 (four) times daily. 12/23/18   Cassandria Anger, MD  metolazone (ZAROXOLYN) 5 MG tablet Take 1 tablet (5 mg total) by mouth daily. Patient not taking: Reported on 03/31/2021 03/30/21 06/28/21  Rafael Bihari, FNP      Allergies    Patient has no known allergies.    Review of Systems   Review of Systems  Constitutional:  Negative for chills and fever.  HENT:  Negative for congestion.   Respiratory:  Negative for shortness of breath.   Cardiovascular:  Positive for leg swelling. Negative for chest pain.  Gastrointestinal:  Negative for abdominal pain, diarrhea, nausea and vomiting.  Genitourinary:  Negative for enuresis.  Musculoskeletal:  Negative for back pain.  Skin:  Negative for rash.  Neurological:  Negative for dizziness.  Hematological:  Does not bruise/bleed easily.   Physical Exam Updated Vital Signs BP 106/60 (BP Location: Right Arm)    Pulse 61    Temp 98.1 F (36.7 C) (Oral)    Resp 18    Ht 6' (1.829 m)    Wt 100.6 kg    SpO2 100%    BMI 30.08 kg/m  Physical Exam Vitals and nursing note reviewed.  Constitutional:      General: He is not in acute distress.    Appearance: He is not ill-appearing.  HENT:     Head: Normocephalic and atraumatic.     Nose: No congestion.     Mouth/Throat:     Mouth: Mucous membranes are moist.     Pharynx: Oropharynx is clear.  Eyes:     Conjunctiva/sclera: Conjunctivae normal.  Neck:     Comments: No JVD present Cardiovascular:     Rate and Rhythm: Normal rate and regular rhythm.     Pulses: Normal pulses.     Heart sounds: No murmur heard.   No friction rub. No gallop.  Pulmonary:     Effort: No respiratory distress.     Breath sounds: Rales present. No wheezing or rhonchi.     Comments: Slight bibasilar rales Abdominal:     Palpations:  Abdomen is soft.     Tenderness: There is no abdominal tenderness. There is no right CVA tenderness or left CVA tenderness.  Musculoskeletal:     Right lower leg: Edema present.     Left lower leg: Edema present.     Comments: Patient's legs were wrapped in Ace bandages, wraps were removed, shows that he has 1+ pitting edema going up both legs, no signs of chronic skin changes, neurovascular fully intact.  Skin:    General: Skin is warm and dry.     Capillary Refill: Capillary refill takes less than 2 seconds.  Neurological:     Mental Status: He is alert.  Psychiatric:  Mood and Affect: Mood normal.    ED Results / Procedures / Treatments   Labs (all labs ordered are listed, but only abnormal results are displayed) Labs Reviewed  RESP PANEL BY RT-PCR (FLU A&B, COVID) ARPGX2  BRAIN NATRIURETIC PEPTIDE  COMPREHENSIVE METABOLIC PANEL  CBC WITH DIFFERENTIAL/PLATELET    EKG None  Radiology No results found.  Procedures Procedures    Medications Ordered in ED Medications - No data to display  ED Course/ Medical Decision Making/ A&P Clinical Course as of 03/31/21 1013  Fri Mar 31, 2021  1002 This is a 76 year old gentleman with a history of congestive heart failure on torsemide and metolazone, TAVR on aspirin, presenting to the ED in the company of his wife with concern for lower extremity leg swelling.  They report his weights have been fluctuating up and down the past several days, although he may be mildly positive today.  He has had worsening edema of the lower extremities.  He has chronic dyspnea, no significant changes in his baseline functional daily activities.  He does report coughing at night with some orthopnea when in bed.  Overall however he denies any chest pain or shortness of breath or persistent coughing.  He denies any viral syndrome.  His vital signs are within normal limits.  He is breathing comfortably and satting 100% on room air.  He does have fine  crackles on pulmonary exam and symmetrical lower extremity pitting edema.  This could be consistent with congestive heart failure.  We will check his blood test, including his kidney function and his BMP, as well as a chest x-ray.  He may benefit from some diuresis here in the ED [MT]    Clinical Course User Index [MT] Trifan, Carola Rhine, MD                           Medical Decision Making  This patient presents to the ED for concern of leg swelling, this involves an extensive number of treatment options, and is a complaint that carries with it a high risk of complications and morbidity.  The differential diagnosis includes DVT, congestive heart failure,    Additional history obtained:  Additional history obtained from electronic medical records External records from outside source obtained and reviewed including previous cardiology note   Co morbidities that complicate the patient evaluation  CHF, chronic kidney disease  Social Determinants of Health:  N/A    Lab Tests:  I Ordered, and personally interpreted labs.  The pertinent results include: CBC shows normocytic anemia hemoglobin 9 appears to be at baseline, CMP shows glucose of 69, BUN 42 creatinine 2.25 at baseline per patient, alk phos 188T bili 2.1 GFR 30, BNP is 2067 respiratory panel negative   Imaging Studies ordered:  I ordered imaging studies including chest x-ray I independently visualized and interpreted imaging which showed monitoring of the cardiac silhouette pulmonary vascular congestion I agree with the radiologist interpretation   Cardiac Monitoring:  The patient was maintained on a cardiac monitor.  I personally viewed and interpreted the cardiac monitored which showed an underlying rhythm of: V paced rhythm without signs of ischemia.   Medicines ordered and prescription drug management:  I ordered medication including Lasix for volume overload and I have reviewed the patients home medicines and  have made adjustments as needed   Reevaluation:  After the interventions noted above, I reevaluated the patient and found that they have :improved  Due to patient's  comorbidities i.e. systolic CHF with an EF of 35% and CKD with increasing weights patient will need IV Lasix and monitoring.  We will consult with cardiology for further recommendations.  Patient is updated on recommendations from cardiology they are agreement this plan will consult hospitalist for admission.  Consultations Obtained:  I requested consultation with the Dr. Harrell Gave of cardiology,  and discussed lab and imaging findings as well as pertinent plan - they recommend: Recommends admission to the hospitalist team, and transfer him to [facility] where there will be a dedicated cardiac team as well as CHF team. Spoke with Dr. Wynetta Emery the hospitalist team who will admit the patient.    Test Considered:  DVT study will defer as there is no unilateral leg swelling, presentation more consistent with CHF.    Rule out I have low suspicion for ACS as history is atypical,EKG was sinus rhythm without signs of ischemia, troponins are deferred as he has no chest pain low suspicion for ACS at this time.  Low suspicion for PE patient nontachypneic, nonhypoxic, unilateral leg swelling, presentation inconsistent with etiology.  low suspicion for AAA or aortic dissection as history is atypical, patient has low risk factors.  Low suspicion for systemic infection as patient is nontoxic-appearing, vital signs reassuring, no obvious source infection noted on exam.     Dispostion and problem list  After consideration of the diagnostic results and the patients response to treatment, I feel that the patent would benefit from  Acute chronic CHF exacerbation-admission for IV diuresis and continued monitoring of his creatinine function..             Final Clinical Impression(s) / ED Diagnoses Final diagnoses:  None    Rx  / DC Orders ED Discharge Orders     None         Zimere, Dunlevy, PA-C 03/31/21 1350    Wyvonnia Dusky, MD 03/31/21 [phone]

## 2021-03-31 NOTE — ED Notes (Signed)
Report given to [facility] 

## 2021-03-31 NOTE — Consult Note (Signed)
Cardiology Consultation:   Patient ID: John [name] MRN: [id number]; DOB: 02/16/1946  Admit date: 03/31/2021 Date of Consult: 03/31/2021  PCP:  [name], MD   [place] Providers Cardiologist:  [name], MD  Electrophysiologist:  [name], MD  Advanced Heart Failure:  Glori Bickers, MD       Patient Profile:   John [name] is a 76 y.o. male with a hx of chronic combined systolic and diastolic heart failure, nonischemic cardiomyopathy, complete heart block status post PPM, T2DM, hypertension, atrial flutter, stage IIIb CKD, severe AS status post TAVR who is being seen 03/31/2021 for the evaluation of heart failure at the request of Dr Langston Masker.  History of Present Illness:   John [name] is a 8 old male with the above medical history who we are consulted to see for evaluation of heart failure.  Echocardiogram in July 2021 showed EF 30 to 35%, moderate to severe MR and moderate AS.  Underwent LHC/RHC 11/2019 showed no CAD.  Underwent TAVR in 04/2020.  Had CRT upgrade 05/2020.  Echo 05/2020 with EF 25 to 30%.  Had GI bleed in 09/2020, Eliquis was held and EGD showed duodenal ulcer, underwent IR embolization.  He was admitted in October with decompensated heart failure, improved with IV diuresis.  Weight at discharge was 209 pounds.  Admitted in November with acute diverticulitis and GI bleed.  He has been referred to Dr. Quentin Ore for [facility] evaluation.  He was seen by Dr. Haroldine Laws in clinic yesterday.  He also saw nephrology yesterday.  He reported worsening shortness of breath and leg swelling, his torsemide dose was increased to 100 mg every morning/50 mg every afternoon.  His weight was up about 15 to 18 pounds(weight 226 pounds, up from baseline 209 pounds).  Dr. Haroldine Laws discussed with him about going to the ED yesterday.  Patient declined, wanted to try p.o. diuresis first.  Did not have improvement so presented to ED today.  On presentation to the ER, initial vital  signs notable for SPO2 100% on room air, BP 106/60, pulse 61.  Labs notable for BNP 2067, creatinine 2.25 (down from 2.4 on 12/20), albumin 3.2, hemoglobin 9.0.  Chest x-ray with pulmonary vascular congestion.   Past Medical History:  Diagnosis Date   Atrial flutter ([place]) 12/2010   Admitted with symptomatic bradycardia (HR 40s), atrial flutter with slow ventricular response 12/2010 + volume overload; AV nodal agents d/c'd and Pradaxa started; RFA in 01/2011   Chronic kidney disease, stage 3b (HCC)    Chronic systolic CHF (congestive heart failure) (HCC)    Class 2 severe obesity due to excess calories with serious comorbidity and body mass index (BMI) of 37.0 to 37.9 in adult ([place]) 07/17/2017   Diabetes mellitus    non insulin dependant   Hematoma, chest wall 05/15/2020   Hyperlipidemia    Hypertension    Mitral regurgitation    NASH (nonalcoholic steatohepatitis)    NICM (nonischemic cardiomyopathy) (HCC)    Osteoarthritis    Polyarticular gout 05/15/2020   Presence of permanent cardiac pacemaker    PSVT (paroxysmal supraventricular tachycardia) (HCC)    Possibly atrial flutter   S/P TAVR (transcatheter aortic valve replacement) 05/03/2020   s/p TAVR with a 29 mm Edwards S3U via the subclavian approach with Dr. Angelena Form & Dr. Cyndia Bent    Severe aortic stenosis     Past Surgical History:  Procedure Laterality Date   ATRIAL FLUTTER ABLATION N/A 02/07/2011   Procedure: ATRIAL FLUTTER ABLATION;  Surgeon: Evans Lance, MD;  Location: [facility] CATH LAB;  Service: Cardiovascular;  Laterality: N/A;   BIV UPGRADE N/A 06/08/2020   Procedure: BIV PPM UPGRADE;  Surgeon: Evans Lance, MD;  Location: [place] CV LAB;  Service: Cardiovascular;  Laterality: N/A;   CARDIAC ELECTROPHYSIOLOGY STUDY AND ABLATION  02/07/2011   CARDIOVERSION N/A 03/23/2016   Procedure: CARDIOVERSION;  Surgeon: Evans Lance, MD;  Location: [place];  Service: Cardiovascular;  Laterality: N/A;   COLONOSCOPY N/A  04/17/2017   Rehman: 3 polyps in [facility], diverticulosis   COLONOSCOPY N/A 02/23/2021   Procedure: COLONOSCOPY;  Surgeon: Rogene Houston, MD;  [facility];  Service: Endoscopy;  Laterality: N/A;  12:15   EP IMPLANTABLE DEVICE N/A 08/29/2015   Procedure: Pacemaker Implant;  Surgeon: Evans Lance, MD;  Location: [place] CV LAB;  Service: Cardiovascular;  Laterality: N/A;   EP IMPLANTABLE DEVICE N/A 12/07/2015   Procedure: PPM Lead Revision/Repair;  Surgeon: Evans Lance, MD;  Location: [place] CV LAB;  Service: Cardiovascular;  Laterality: N/A;   ESOPHAGOGASTRODUODENOSCOPY (EGD) WITH PROPOFOL N/A 10/18/2020   Castaneda: normal esophagus, erythematous mucosa in gastric body, non bleeding duodenal ulcer with a non bleeding visible vessel (forrest Class IIa). Injected, clip placed, no specimens collected.   ESOPHAGOGASTRODUODENOSCOPY (EGD) WITH PROPOFOL N/A 01/27/2021   Procedure: ESOPHAGOGASTRODUODENOSCOPY (EGD) WITH PROPOFOL;  Surgeon: [name], DO;  [facility];  Service: Endoscopy;  Laterality: N/A;   ESOPHAGOGASTRODUODENOSCOPY (EGD) WITH PROPOFOL N/A 02/09/2021   Procedure: ESOPHAGOGASTRODUODENOSCOPY (EGD) WITH PROPOFOL;  Surgeon: [name], DO;  [facility];  Service: Endoscopy;  Laterality: N/A;   GIVENS CAPSULE STUDY N/A 02/07/2021   Procedure: GIVENS CAPSULE STUDY;  Surgeon: Harvel Quale, MD;  [facility];  Service: Gastroenterology;  Laterality: N/A;   IR ANGIOGRAM SELECTIVE EACH ADDITIONAL VESSEL  10/19/2020   IR ANGIOGRAM SELECTIVE EACH ADDITIONAL VESSEL  10/19/2020   IR ANGIOGRAM VISCERAL SELECTIVE  10/19/2020   IR ANGIOGRAM VISCERAL SELECTIVE  10/19/2020   IR EMBO ART  VEN HEMORR LYMPH EXTRAV  INC GUIDE ROADMAPPING  10/19/2020   IR US GUIDE VASC ACCESS RIGHT  10/19/2020   KNEE ARTHROSCOPY  03/27/2003   left   LUMBAR SPINE SURGERY     "I've had 6 ORs 1972 thru 2004"   POLYPECTOMY  04/17/2017    Procedure: POLYPECTOMY;  Surgeon: Rogene Houston, MD;  [facility];  Service: Endoscopy;;  transverse colon x3;   RIGHT HEART CATH N/A 04/28/2020   Procedure: RIGHT HEART CATH;  Surgeon: Jolaine Artist, MD;  Location: [place] CV LAB;  Service: Cardiovascular;  Laterality: N/A;   RIGHT/LEFT HEART CATH AND CORONARY ANGIOGRAPHY N/A 12/21/2019   Procedure: RIGHT/LEFT HEART CATH AND CORONARY ANGIOGRAPHY;  Surgeon: Nelva Bush, MD;  Location: [place] CV LAB;  Service: Cardiovascular;  Laterality: N/A;   TEE WITHOUT CARDIOVERSION N/A 11/05/2019   Procedure: TRANSESOPHAGEAL ECHOCARDIOGRAM (TEE) WITH PROPOFOL;  Surgeon: Arnoldo Lenis, MD;  [facility];  Service: Endoscopy;  Laterality: N/A;   TEE WITHOUT CARDIOVERSION N/A 05/03/2020   Procedure: TRANSESOPHAGEAL ECHOCARDIOGRAM (TEE);  Surgeon: Burnell Blanks, MD;  Location: [place];  Service: Open Heart Surgery;  Laterality: N/A;       Inpatient Medications: Scheduled Meds:  Continuous Infusions:  PRN Meds:   Allergies:   No Known Allergies  Social History:   Social History   Socioeconomic History   Marital status: Married  Spouse name: Not on file   Number of children: 2   Years of education: Not on file   Highest education level: Not on file  Occupational History   Occupation: Designer, industrial/product    Employer: RETIRED  Tobacco Use   Smoking status: Former    Packs/day: 1.00    Years: 10.00    Pack years: 10.00    Types: Cigarettes    Quit date: 03/26/1986    Years since quitting: 35.0   Smokeless tobacco: Never   Tobacco comments:    "stopped cigarette  smoking 1988"  Vaping Use   Vaping Use: Never used  Substance and Sexual Activity   Alcohol use: Not Currently    Alcohol/week: 0.0 standard drinks    Comment: "quit alcohol ~ 2007"   Drug use: No   Sexual activity: Yes    Partners: Female  Other Topics Concern   Not on file  Social History Narrative   Not on  file   Social Determinants of Health   Financial Resource Strain: Not on file  Food Insecurity: No Food Insecurity   Worried About [place] in the Last Year: Never true   [place] in the Last Year: Never true  Transportation Needs: No Transportation Needs   Lack of Transportation (Medical): No   Lack of Transportation (Non-Medical): No  Physical Activity: Not on file  Stress: Not on file  Social Connections: Not on file  Intimate Partner Violence: Not on file    Family History:    Family History  Problem Relation Age of Onset   Heart attack Mother    Hypertension Mother    Heart attack Father    Hypertension Father    Heart attack Brother    Colon cancer Neg Hx      ROS:  Please see the history of present illness.   All other ROS reviewed and negative.     Physical Exam/Data:   Vitals:   03/31/21 1000 03/31/21 1100 03/31/21 1130 03/31/21 1200  BP: 108/60 114/66 110/64 112/64  Pulse: 71 62 61 60  Resp: 18 15 18 18   Temp:      TempSrc:      SpO2: 100% 100% 94% 100%  Weight:      Height:       No intake or output data in the 24 hours ending 03/31/21 1232 Last 3 Weights 03/31/2021 03/30/2021 03/22/2021  Weight (lbs) 221 lb 12.8 oz 226 lb 12.8 oz 222 lb  Weight (kg) 100.608 kg 102.876 kg 100.699 kg     Body mass index is 30.08 kg/m.  General:  Well nourished, well developed, in no acute distress HEENT: normal Neck: +JVD Vascular: No carotid bruits; Distal pulses 2+ bilaterally Cardiac:  normal S1, S2; RRR; no murmur  Lungs:  clear to auscultation bilaterally Abd: soft, nontender, no hepatomegaly  Ext: 3+ BLE edema Musculoskeletal:  No deformities, BUE and BLE strength normal and equal Skin: warm and dry  Neuro:  CNs 2-12 intact, no focal abnormalities noted Psych:  Normal affect   EKG:  The EKG was personally reviewed and demonstrates: Atrial fibrillation, ventricular paced rhythm, rate 60s Telemetry:  Telemetry was personally reviewed and  demonstrates: Atrial fibrillation, rate 50s to 60s  Relevant CV Studies:   Laboratory Data:  High Sensitivity Troponin:  No results for input(s): TROPONINIHS in the last 720 hours.   Chemistry Recent Labs  Lab 03/31/21 1020  NA 138  K 3.7  CL 102  CO2 27  GLUCOSE 69*  BUN 42*  CREATININE 2.25*  CALCIUM 8.9  GFRNONAA 30*  ANIONGAP 9    Recent Labs  Lab 03/31/21 1020  PROT 7.0  ALBUMIN 3.2*  AST 32  ALT 15  ALKPHOS 188*  BILITOT 2.1*   Lipids No results for input(s): CHOL, TRIG, HDL, LABVLDL, LDLCALC, CHOLHDL in the last 168 hours.  Hematology Recent Labs  Lab 03/31/21 1020  WBC 5.6  RBC 3.39*  HGB 9.0*  HCT 27.7*  MCV 81.7  MCH 26.5  MCHC 32.5  RDW 23.1*  PLT 129*   Thyroid No results for input(s): TSH, FREET4 in the last 168 hours.  BNP Recent Labs  Lab 03/31/21 1018  BNP 2,067.0*    DDimer No results for input(s): DDIMER in the last 168 hours.   Radiology/Studies:  DG Chest 2 View  Result Date: 03/31/2021 CLINICAL DATA:  BILATERAL rales, BILATERAL leg swelling, and shortness of breath for 2 weeks, history CHF, former smoker EXAM: CHEST - 2 VIEW COMPARISON:  12/30/2020 FINDINGS: LEFT subclavian pacemaker leads project at RIGHT atrium and RIGHT ventricle. Enlargement of cardiac silhouette with pulmonary vascular congestion post TAVR. Stable mediastinal contours. Accentuation of interstitial markings, better demonstrated on prior exam, likely chronic interstitial lung disease. Minimal atelectasis at posterior lung bases. No definite acute infiltrate, pleural effusion, or pneumothorax. No acute osseous findings. IMPRESSION: Enlargement of cardiac silhouette with pulmonary vascular congestion post pacemaker and TAVR. Chronic interstitial lung disease with minimal bibasilar atelectasis. No acute infiltrate. Electronically Signed   By: Lavonia Dana M.D.   On: 03/31/2021 10:14     Assessment and Plan:   Acute on chronic combined Heart Failure/NICM: EF has been  as low as 25 to 30%.  LHC 9/21 showed no CAD.  Most recent echo 12/2020 showed EF 30 to 35%.  Appears warm and wet on exam.  Weight up 15 pounds from baseline - Start IV Lasix 80 mg twice daily and metolazone 5 mg daily - Daily weights, strict I's/O's - Continue spiro 12.5 mg daily.  - Continue carvedilol 3.125 mg bid. - Off Farxiga due to yeast. - No ACE/ARB/Arni given renal function   CKD stage IIIb: Creatinine 2.25 today.  Will monitor closely with diuresis.   Mitral Regurgitation : Moderate. Likely functional from dilated CM.   Aortic Stenosis: s/p TAVR 3/22.  Normal functioning bioprosthetic valve on echo 12/2020   Atrial Flutter/ Chronic Atrial Fibrillation: s/p AFL ablation. In chronic Afib, rate controlled.  s/p frequent GI bleed so off Eliquis. CHA2DS2-VASc Score = at least 5.  Seen by EP, felt to not be watchman candidate  H/o CHB: Status post PPM.  Possible RV paced cardiomyopathy.  CRT upgrade was attempted 05/2020 but unsuccessful  due to subclavian vein occluded  UGIB: Has had recurrent GI bleeds, off Eliquis.  Hemoglobin stable at 9.0.  Disposition: d/w ED, recommend admitting to medicine with cardiology consulted.  Would recommend transferring to [facility] for Advanced Heart Failure evaluation  For questions or updates, please contact [place] Please consult [email or web address] for contact info under    Signed, Donato Heinz, MD  03/31/2021 12:32 PM

## 2021-03-31 NOTE — H&P (Signed)
History and Physical  [place]odge BFX:[id number] DOB: [birth year] DOA: 03/31/2021  PCP: [name], MD  Patient coming from: Home by [facility]  Level of care: Telemetry Cardiac  I have personally briefly reviewed patient's old medical records in [place]  Chief Complaint: weight gain, dyspnea   HPI: John [name] is a 76 y.o. male with medical history significant for atrial flutter, chronic kidney disease, heart failure, severe aortic stenosis status post TAVR, anemia paroxysmal SVT, NICM, hypertension, hyperlipidemia, type 2 diabetes mellitus, insulin requiring, uncontrolled with A1c>9, gout and other comorbidities detailed below presents to the ED with worsening shortness of breath ongoing for the past several days.  He reports he is gained approximately 15 pounds.  He has been taking extra metolazone and torsemide.  Unfortunately he reports that he is not having good diuresis with this therapy.  He is unable to lie recumbent in bed.  He has persistent shortness of breath symptoms.  He is having difficulty wearing his compression stockings.  His baseline weight had normally been around 209 pounds.  He also had been in discussion with his heart team regarding possible need for dialysis to maintain volume status given his worsening CKD.  Unfortunately with your medications not working as well for him at home he presented to the emergency department.  He has been seen by the inpatient cardiology service and they have recommended inpatient admission for IV diuresis and requested transfer to [facility] for consultation with the advanced heart failure service.  He has been started on IV Lasix 80 mg with good results so far.  Fortunately his labs and imaging are stable at this time.  Review of Systems: Review of Systems  Constitutional:  Positive for malaise/fatigue. Negative for chills, diaphoresis and fever.  HENT: Negative.    Eyes: Negative.   Respiratory:   Positive for cough and shortness of breath. Negative for sputum production and wheezing.   Cardiovascular:  Positive for palpitations, orthopnea, leg swelling and PND. Negative for chest pain.  Gastrointestinal: Negative.   Genitourinary: Negative.   Musculoskeletal: Negative.   Skin:  Negative for itching and rash.  Neurological: Negative.   Endo/Heme/Allergies: Negative.   Psychiatric/Behavioral: Negative.    All other systems reviewed and are negative.   Past Medical History:  Diagnosis Date   Atrial flutter ([place]) 12/2010   Admitted with symptomatic bradycardia (HR 40s), atrial flutter with slow ventricular response 12/2010 + volume overload; AV nodal agents d/c'd and Pradaxa started; RFA in 01/2011   Chronic kidney disease, stage 3b (HCC)    Chronic systolic CHF (congestive heart failure) (HCC)    Class 2 severe obesity due to excess calories with serious comorbidity and body mass index (BMI) of 37.0 to 37.9 in adult ([place]) 07/17/2017   Diabetes mellitus    non insulin dependant   Hematoma, chest wall 05/15/2020   Hyperlipidemia    Hypertension    Mitral regurgitation    NASH (nonalcoholic steatohepatitis)    NICM (nonischemic cardiomyopathy) (HCC)    Osteoarthritis    Polyarticular gout 05/15/2020   Presence of permanent cardiac pacemaker    PSVT (paroxysmal supraventricular tachycardia) (HCC)    Possibly atrial flutter   S/P TAVR (transcatheter aortic valve replacement) 05/03/2020   s/p TAVR with a 29 mm Edwards S3U via the subclavian approach with Dr. Angelena Form & Dr. Cyndia Bent    Severe aortic stenosis     Past Surgical History:  Procedure Laterality Date  ATRIAL FLUTTER ABLATION N/A 02/07/2011   Procedure: ATRIAL FLUTTER ABLATION;  Surgeon: Evans Lance, MD;  Location: [facility] CATH LAB;  Service: Cardiovascular;  Laterality: N/A;   BIV UPGRADE N/A 06/08/2020   Procedure: BIV PPM UPGRADE;  Surgeon: Evans Lance, MD;  Location: [place] CV LAB;  Service: Cardiovascular;   Laterality: N/A;   CARDIAC ELECTROPHYSIOLOGY STUDY AND ABLATION  02/07/2011   CARDIOVERSION N/A 03/23/2016   Procedure: CARDIOVERSION;  Surgeon: Evans Lance, MD;  Location: [place];  Service: Cardiovascular;  Laterality: N/A;   COLONOSCOPY N/A 04/17/2017   Rehman: 3 polyps in [facility], diverticulosis   COLONOSCOPY N/A 02/23/2021   Procedure: COLONOSCOPY;  Surgeon: Rogene Houston, MD;  [facility];  Service: Endoscopy;  Laterality: N/A;  12:15   EP IMPLANTABLE DEVICE N/A 08/29/2015   Procedure: Pacemaker Implant;  Surgeon: Evans Lance, MD;  Location: [place] CV LAB;  Service: Cardiovascular;  Laterality: N/A;   EP IMPLANTABLE DEVICE N/A 12/07/2015   Procedure: PPM Lead Revision/Repair;  Surgeon: Evans Lance, MD;  Location: [place] CV LAB;  Service: Cardiovascular;  Laterality: N/A;   ESOPHAGOGASTRODUODENOSCOPY (EGD) WITH PROPOFOL N/A 10/18/2020   Castaneda: normal esophagus, erythematous mucosa in gastric body, non bleeding duodenal ulcer with a non bleeding visible vessel (forrest Class IIa). Injected, clip placed, no specimens collected.   ESOPHAGOGASTRODUODENOSCOPY (EGD) WITH PROPOFOL N/A 01/27/2021   Procedure: ESOPHAGOGASTRODUODENOSCOPY (EGD) WITH PROPOFOL;  Surgeon: [name], DO;  [facility];  Service: Endoscopy;  Laterality: N/A;   ESOPHAGOGASTRODUODENOSCOPY (EGD) WITH PROPOFOL N/A 02/09/2021   Procedure: ESOPHAGOGASTRODUODENOSCOPY (EGD) WITH PROPOFOL;  Surgeon: [name], DO;  [facility];  Service: Endoscopy;  Laterality: N/A;   GIVENS CAPSULE STUDY N/A 02/07/2021   Procedure: GIVENS CAPSULE STUDY;  Surgeon: Harvel Quale, MD;  [facility];  Service: Gastroenterology;  Laterality: N/A;   IR ANGIOGRAM SELECTIVE EACH ADDITIONAL VESSEL  10/19/2020   IR ANGIOGRAM SELECTIVE EACH ADDITIONAL VESSEL  10/19/2020   IR ANGIOGRAM VISCERAL SELECTIVE  10/19/2020   IR ANGIOGRAM VISCERAL SELECTIVE  10/19/2020    IR EMBO ART  VEN HEMORR LYMPH EXTRAV  INC GUIDE ROADMAPPING  10/19/2020   IR US GUIDE VASC ACCESS RIGHT  10/19/2020   KNEE ARTHROSCOPY  03/27/2003   left   LUMBAR SPINE SURGERY     "I've had 6 ORs 1972 thru 2004"   POLYPECTOMY  04/17/2017   Procedure: POLYPECTOMY;  Surgeon: Rogene Houston, MD;  [facility];  Service: Endoscopy;;  transverse colon x3;   RIGHT HEART CATH N/A 04/28/2020   Procedure: RIGHT HEART CATH;  Surgeon: Jolaine Artist, MD;  Location: [place] CV LAB;  Service: Cardiovascular;  Laterality: N/A;   RIGHT/LEFT HEART CATH AND CORONARY ANGIOGRAPHY N/A 12/21/2019   Procedure: RIGHT/LEFT HEART CATH AND CORONARY ANGIOGRAPHY;  Surgeon: Nelva Bush, MD;  Location: [place] CV LAB;  Service: Cardiovascular;  Laterality: N/A;   TEE WITHOUT CARDIOVERSION N/A 11/05/2019   Procedure: TRANSESOPHAGEAL ECHOCARDIOGRAM (TEE) WITH PROPOFOL;  Surgeon: Arnoldo Lenis, MD;  [facility];  Service: Endoscopy;  Laterality: N/A;   TEE WITHOUT CARDIOVERSION N/A 05/03/2020   Procedure: TRANSESOPHAGEAL ECHOCARDIOGRAM (TEE);  Surgeon: Burnell Blanks, MD;  Location: [place];  Service: Open Heart Surgery;  Laterality: N/A;    reports that he quit smoking about 35 years ago. His smoking use included cigarettes. He has a 10.00 pack-year smoking history. He has never used smokeless  tobacco. He reports that he does not currently use alcohol. He reports that he does not use drugs.  No Known Allergies  Family History  Problem Relation Age of Onset   Heart attack Mother    Hypertension Mother    Heart attack Father    Hypertension Father    Heart attack Brother    Colon cancer Neg Hx     Prior to Admission medications   Medication Sig Start Date End Date Taking? Authorizing Provider  acetaminophen (TYLENOL) 325 MG tablet Take 2 tablets (650 mg total) by mouth every 4 (four) hours as needed for headache or mild pain. 05/15/20  Yes Isaiah Serge, NP   allopurinol (ZYLOPRIM) 300 MG tablet Take 150 mg by mouth as needed (gout flare).   Yes [provider]  aspirin EC 81 MG tablet Take 1 tablet (81 mg total) by mouth daily with breakfast. 10/25/20 10/25/21 Yes Emokpae, Courage, MD  benzonatate (TESSALON) 100 MG capsule Take 1 capsule (100 mg total) by mouth 3 (three) times daily as needed for cough. 09/27/20  Yes Evans Lance, MD  camphor-menthol (ANTI-ITCH) lotion Apply 1 application topically daily as needed for itching.   Yes [provider]  carvedilol (COREG) 3.125 MG tablet Take 1 tablet (3.125 mg total) by mouth 2 (two) times daily with a meal. 01/30/21  Yes Milford, Jessica M, FNP  fexofenadine (ALLEGRA) 180 MG tablet Take 1 tablet by mouth daily.   Yes [provider]  hydrOXYzine (ATARAX) 10 MG tablet Take 10 mg by mouth 3 (three) times daily as needed for itching.  04/09/21 Yes [provider]  insulin degludec (TRESIBA FLEXTOUCH) 100 UNIT/ML FlexTouch Pen Inject 12 Units into the skin at bedtime. 02/13/21  Yes Eily Louvier L, MD  linaclotide (LINZESS) 145 MCG CAPS capsule Take 1 capsule (145 mcg total) by mouth daily before breakfast. 01/26/21  Yes Montez Morita, Daniel, MD  metolazone (ZAROXOLYN) 2.5 MG tablet Take 1 tablet (2.5 mg total) by mouth once a week. Every Monday 02/20/21  Yes Shawntelle Ungar L, MD  Multiple Vitamins-Minerals (ONE-A-DAY MENS 50+) TABS Take 1 tablet by mouth daily with breakfast. Men   Yes [provider]  pantoprazole (PROTONIX) 40 MG tablet Take 1 tablet (40 mg total) by mouth daily. 02/12/21  Yes Dijon Cosens L, MD  potassium chloride SA (KLOR-CON M20) 20 MEQ tablet Take 2 tablets (40 mEq total) by mouth 2 (two) times daily. 03/22/21  Yes Vickie Epley, MD  spironolactone (ALDACTONE) 25 MG tablet Take 0.5 tablets (12.5 mg total) by mouth daily. 02/13/21  Yes Triana Coover L, MD  torsemide (DEMADEX) 20 MG tablet Take 3 tablets (60 mg total) by  mouth 2 (two) times daily. 03/22/21  Yes Vickie Epley, MD  TRULICITY 1.5 DE/0.8XK SOPN INJECT 1.5MG (1 PEN) SUBCUTANEOUSLY EVERY WEEK Patient taking differently: Inject 1.5 mg into the skin once a week. 03/28/21  Yes Nida, Marella Chimes, MD  ACCU-CHEK AVIVA PLUS test strip TEST BLOOD SUGAR TWICE DAILY BEFORE BREAKFAST AND AT BEDTIME 07/19/20   Cassandria Anger, MD  colchicine 0.6 MG tablet Take 1 tablet (0.6 mg total) by mouth daily as needed. Patient not taking: Reported on 03/31/2021 05/15/20   Isaiah Serge, NP  Insulin Pen Needle (BD PEN NEEDLE NANO U/F) 32G X 4 MM MISC 1 each by Does not apply route 4 (four) times daily. 12/23/18   Cassandria Anger, MD  metolazone (ZAROXOLYN) 5 MG tablet Take 1 tablet (5  mg total) by mouth daily. Patient not taking: Reported on 03/31/2021 03/30/21 06/28/21  Rafael Bihari, FNP   Physical Exam: Vitals:   03/31/21 1100 03/31/21 1130 03/31/21 1200 03/31/21 1230  BP: 114/66 110/64 112/64 113/61  Pulse: 62 61 60 (!) 59  Resp: 15 18 18 14   Temp:      TempSrc:      SpO2: 100% 94% 100% 100%  Weight:      Height:        Constitutional: Chronically ill-appearing elderly male lying supine on gurney, mildly distressed, calm, uncomfortable Eyes: PERRL, lids and conjunctivae normal ENMT: Mucous membranes are moist. Posterior pharynx clear of any exudate or lesions.  Neck: normal, supple, no masses, no thyromegaly Respiratory: Bibasilar crackles heard.  Mild tachypnea. Cardiovascular: normal s1, s2 sounds, soft murmur heard. 2+ extremity edema.  Abdomen: Mildly distended, no tenderness, no masses palpated. No hepatosplenomegaly. Bowel sounds positive.  Musculoskeletal: 2+ pitting edema, bilateral lower extremities, no clubbing / cyanosis. No joint deformity upper and lower extremities. Good ROM, no contractures. Normal muscle tone.  Skin: no rashes, lesions, ulcers. No induration Neurologic: CN 2-12 grossly intact. Sensation intact, DTR normal.  Strength 5/5 in all 4.  Psychiatric: Normal judgment and insight. Alert and oriented x 3. Normal mood.   Labs on Admission: I have personally reviewed following labs and imaging studies  CBC: Recent Labs  Lab 03/31/21 1020  WBC 5.6  NEUTROABS 3.4  HGB 9.0*  HCT 27.7*  MCV 81.7  PLT 742*   Basic Metabolic Panel: Recent Labs  Lab 03/31/21 1020  NA 138  K 3.7  CL 102  CO2 27  GLUCOSE 69*  BUN 42*  CREATININE 2.25*  CALCIUM 8.9   GFR: Estimated Creatinine Clearance: 34.8 mL/min (A) (by C-G formula based on SCr of 2.25 mg/dL (H)). Liver Function Tests: Recent Labs  Lab 03/31/21 1020  AST 32  ALT 15  ALKPHOS 188*  BILITOT 2.1*  PROT 7.0  ALBUMIN 3.2*   No results for input(s): LIPASE, AMYLASE in the last 168 hours. No results for input(s): AMMONIA in the last 168 hours. Coagulation Profile: No results for input(s): INR, PROTIME in the last 168 hours. Cardiac Enzymes: No results for input(s): CKTOTAL, CKMB, CKMBINDEX, TROPONINI in the last 168 hours. BNP (last 3 results) No results for input(s): PROBNP in the last 8760 hours. HbA1C: No results for input(s): HGBA1C in the last 72 hours. CBG: No results for input(s): GLUCAP in the last 168 hours. Lipid Profile: No results for input(s): CHOL, HDL, LDLCALC, TRIG, CHOLHDL, LDLDIRECT in the last 72 hours. Thyroid Function Tests: No results for input(s): TSH, T4TOTAL, FREET4, T3FREE, THYROIDAB in the last 72 hours. Anemia Panel: No results for input(s): VITAMINB12, FOLATE, FERRITIN, TIBC, IRON, RETICCTPCT in the last 72 hours. Urine analysis:    Component Value Date/Time   COLORURINE STRAW (A) 01/26/2021 1328   APPEARANCEUR CLEAR 01/26/2021 1328   LABSPEC 1.008 01/26/2021 1328   PHURINE 6.0 01/26/2021 1328   GLUCOSEU NEGATIVE 01/26/2021 1328   HGBUR NEGATIVE 01/26/2021 1328   BILIRUBINUR NEGATIVE 01/26/2021 1328   KETONESUR NEGATIVE 01/26/2021 1328   PROTEINUR NEGATIVE 01/26/2021 1328   UROBILINOGEN 0.2  01/05/2014 0744   NITRITE NEGATIVE 01/26/2021 1328   LEUKOCYTESUR NEGATIVE 01/26/2021 1328    Radiological Exams on Admission: DG Chest 2 View  Result Date: 03/31/2021 CLINICAL DATA:  BILATERAL rales, BILATERAL leg swelling, and shortness of breath for 2 weeks, history CHF, former smoker EXAM: CHEST - 2 VIEW COMPARISON:  12/30/2020 FINDINGS: LEFT subclavian pacemaker leads project at RIGHT atrium and RIGHT ventricle. Enlargement of cardiac silhouette with pulmonary vascular congestion post TAVR. Stable mediastinal contours. Accentuation of interstitial markings, better demonstrated on prior exam, likely chronic interstitial lung disease. Minimal atelectasis at posterior lung bases. No definite acute infiltrate, pleural effusion, or pneumothorax. No acute osseous findings. IMPRESSION: Enlargement of cardiac silhouette with pulmonary vascular congestion post pacemaker and TAVR. Chronic interstitial lung disease with minimal bibasilar atelectasis. No acute infiltrate. Electronically Signed   By: Lavonia Dana M.D.   On: 03/31/2021 10:14    EKG: Independently reviewed. Atrial fibrillation paced   Assessment/Plan Principal Problem:   Acute heart failure (HCC) Active Problems:   DM type 2 causing vascular disease (HCC)   Mixed hyperlipidemia   Essential hypertension, benign   Pacemaker   CKD (chronic kidney disease) stage 3, GFR 30-59 ml/min (HCC)   Paroxysmal atrial fibrillation (HCC)   Hyponatremia   Acute on chronic combined systolic (congestive) and diastolic (congestive) heart failure (HCC)   Lower leg edema   NASH (nonalcoholic steatohepatitis)   Thrombocytopenia (HCC)   Hypoalbuminemia   S/P TAVR (transcatheter aortic valve replacement)   Polyarticular gout   SOB (shortness of breath)   GERD (gastroesophageal reflux disease)    Acute HFrEF  - Pt had recent Echo October 2022 with EF 30-35% - he presents now with a 15# weight gain and volume overload - continue IV lasix per  cardiology recommendation, with metolazone 5 mg daily - Heart failure order set initiated -Monitor daily weights, intake and output and electrolytes closely -Consult to advanced heart failure team when arrives at [place] cardiology consultation and recommendations -Follow chest x-ray with portable chest x-ray ordered for tomorrow morning  Type 2 diabetes mellitus with vascular complications, uncontrolled, insulin requiring -Uncontrolled as evidenced by hemoglobin A1c greater than 9% -Continue basal insulin and prandial coverage ordered with SSI and frequent CBG monitoring -Carbohydrate modified diet, no concentrated sweets or fruit juices except to treat a low blood glucose  Atrial flutter/chronic atrial fibrillation -Patient's status post ablation -Heart rate currently controlled -Patient not anticoagulated due to history of frequent GI bleeding  Aortic stenosis s/p TAVR 3/22 -  stable   Anemia in chronic kidney disease  - Hg holding stable at 9.0.  - recheck CBC in AM   Stage 3b CKD  - stable will follow closely with diuresis  DVT prophylaxis:  sq heparin Code Status: full   Family Communication: wife at bedside   Disposition Plan: anticipate home when medically stabilized   Consults called: cardiology   Admission status: INP    Level of care: Telemetry Cardiac Irwin Brakeman MD [facility] How to contact the [facility] Attending or Consulting provider 7A - 7P or covering provider during after hours 7P -7A, for this patient?  Check the care team in [facility] and look for a) attending/consulting [facility] provider listed and b) the [facility] team listed Log into [email or web address] and use [place]'s universal password to access. If you do not have the password, please contact the hospital operator. Locate the [facility] provider you are looking for under [facility] and page to a number that you can be directly reached. If you still have difficulty reaching the provider, please  page the [facility] (Director on Call) for the Hospitalists listed on amion for assistance.   If 7PM-7AM, please contact night-coverage [email or web address] Password [email or web address]  03/31/2021, 2:09 PM

## 2021-03-31 NOTE — ED Notes (Signed)
Meal provided 

## 2021-03-31 NOTE — ED Notes (Signed)
[facility] at bedside. Report given to [facility] RN

## 2021-03-31 NOTE — ED Triage Notes (Signed)
Pt presents to ED with complaints of bilateral leg swelling x a couple of days.

## 2021-04-01 ENCOUNTER — Inpatient Hospital Stay (HOSPITAL_COMMUNITY): Payer: Medicare HMO

## 2021-04-01 DIAGNOSIS — I5043 Acute on chronic combined systolic (congestive) and diastolic (congestive) heart failure: Secondary | ICD-10-CM

## 2021-04-01 DIAGNOSIS — N1832 Chronic kidney disease, stage 3b: Secondary | ICD-10-CM

## 2021-04-01 DIAGNOSIS — I5021 Acute systolic (congestive) heart failure: Secondary | ICD-10-CM | POA: Diagnosis not present

## 2021-04-01 DIAGNOSIS — I10 Essential (primary) hypertension: Secondary | ICD-10-CM | POA: Diagnosis not present

## 2021-04-01 DIAGNOSIS — E1159 Type 2 diabetes mellitus with other circulatory complications: Secondary | ICD-10-CM | POA: Diagnosis not present

## 2021-04-01 LAB — GLUCOSE, CAPILLARY
Glucose-Capillary: 123 mg/dL — ABNORMAL HIGH (ref 70–99)
Glucose-Capillary: 85 mg/dL (ref 70–99)
Glucose-Capillary: 158 mg/dL — ABNORMAL HIGH (ref 70–99)
Glucose-Capillary: 159 mg/dL — ABNORMAL HIGH (ref 70–99)
Glucose-Capillary: 119 mg/dL — ABNORMAL HIGH (ref 70–99)

## 2021-04-01 LAB — BASIC METABOLIC PANEL
Anion gap: 8 (ref 5–15)
Anion gap: 10 (ref 5–15)
BUN: 37 mg/dL — ABNORMAL HIGH (ref 8–23)
BUN: 38 mg/dL — ABNORMAL HIGH (ref 8–23)
CO2: 29 mmol/L (ref 22–32)
CO2: 29 mmol/L (ref 22–32)
Calcium: 9.1 mg/dL (ref 8.9–10.3)
Calcium: 9.2 mg/dL (ref 8.9–10.3)
Chloride: 98 mmol/L (ref 98–111)
Chloride: 99 mmol/L (ref 98–111)
Creatinine, Ser: 1.99 mg/dL — ABNORMAL HIGH (ref 0.61–1.24)
Creatinine, Ser: 2.05 mg/dL — ABNORMAL HIGH (ref 0.61–1.24)
GFR, Estimated: 34 mL/min — ABNORMAL LOW (ref >60)
GFR, Estimated: 33 mL/min — ABNORMAL LOW (ref >60)
Glucose, Bld: 110 mg/dL — ABNORMAL HIGH (ref 70–99)
Glucose, Bld: 64 mg/dL — ABNORMAL LOW (ref 70–99)
Potassium: 3.3 mmol/L — ABNORMAL LOW (ref 3.5–5.1)
Potassium: 4.1 mmol/L (ref 3.5–5.1)
Sodium: 135 mmol/L (ref 135–145)
Sodium: 138 mmol/L (ref 135–145)

## 2021-04-01 LAB — MAGNESIUM: Magnesium: 2.6 mg/dL — ABNORMAL HIGH (ref 1.7–2.4)

## 2021-04-01 LAB — CBC
HCT: 27.5 % — ABNORMAL LOW (ref 39.0–52.0)
Hemoglobin: 9 g/dL — ABNORMAL LOW (ref 13.0–17.0)
MCH: 25.7 pg — ABNORMAL LOW (ref 26.0–34.0)
MCHC: 32.7 g/dL (ref 30.0–36.0)
MCV: 78.6 fL — ABNORMAL LOW (ref 80.0–100.0)
Platelets: 122 10*3/uL — ABNORMAL LOW (ref 150–400)
RBC: 3.5 MIL/uL — ABNORMAL LOW (ref 4.22–5.81)
RDW: 22.6 % — ABNORMAL HIGH (ref 11.5–15.5)
WBC: 5.3 10*3/uL (ref 4.0–10.5)
nRBC: 0 % (ref 0.0–0.2)

## 2021-04-01 LAB — BRAIN NATRIURETIC PEPTIDE: B Natriuretic Peptide: 2163.5 pg/mL — ABNORMAL HIGH (ref 0.0–100.0)

## 2021-04-01 MED ORDER — INSULIN ASPART 100 UNIT/ML IJ SOLN
0.0000 [IU] | Freq: Three times a day (TID) | INTRAMUSCULAR | Status: DC
  Administered 2021-04-01 (×2): 2 [IU] via SUBCUTANEOUS
  Administered 2021-04-02 (×2): 1 [IU] via SUBCUTANEOUS
  Administered 2021-04-03: 2 [IU] via SUBCUTANEOUS
  Administered 2021-04-03: 1 [IU] via SUBCUTANEOUS
  Administered 2021-04-03: 3 [IU] via SUBCUTANEOUS

## 2021-04-01 MED ORDER — INSULIN GLARGINE-YFGN 100 UNIT/ML ~~LOC~~ SOLN
5.0000 [IU] | Freq: Every day | SUBCUTANEOUS | Status: DC
  Administered 2021-04-01: 5 [IU] via SUBCUTANEOUS
  Filled 2021-04-01 (×2): qty 0.05

## 2021-04-01 NOTE — Social Work (Signed)
CSW acknowledges consult for SNF/HH. The patient will require PT/OT evaluations if ordered. TOC will assist with disposition planning once the evaluations have been completed.    TOC will continue to follow.

## 2021-04-01 NOTE — Progress Notes (Signed)
Progress Note    John [name]  TGP:[id number] DOB: 28-Jan-1946  DOA: 03/31/2021 PCP: [name], MD    Brief Narrative:     Medical records reviewed and are as summarized below:  John [name] is an 76 y.o. male with medical history significant for atrial flutter, chronic kidney disease, heart failure, severe aortic stenosis status post TAVR, anemia paroxysmal SVT, NICM, hypertension, hyperlipidemia, type 2 diabetes mellitus, insulin requiring, uncontrolled with A1c>9, gout and other comorbidities detailed below presents to the ED with worsening shortness of breath ongoing for the past several days.  He reports he is gained approximately 15 pounds.  He has been taking extra metolazone and torsemide.  Unfortunately he reports that he is not having good diuresis with this therapy.    Assessment/Plan:   Principal Problem:   Acute heart failure (HCC) Active Problems:   DM type 2 causing vascular disease ([place])   Mixed hyperlipidemia   Essential hypertension, benign   Pacemaker   CKD (chronic kidney disease) stage 3, GFR 30-59 ml/min (HCC)   Paroxysmal atrial fibrillation (HCC)   Hyponatremia   Acute on chronic combined systolic (congestive) and diastolic (congestive) heart failure (HCC)   Lower leg edema   NASH (nonalcoholic steatohepatitis)   Thrombocytopenia (HCC)   Hypoalbuminemia   S/P TAVR (transcatheter aortic valve replacement)   Polyarticular gout   SOB (shortness of breath)   GERD (gastroesophageal reflux disease)   Acute HFrEF  - Pt had recent Echo October 2022 with EF 30-35% - he presents now with a 15# weight gain and volume overload - continue IV lasix per cardiology recommendation, with metolazone 5 mg daily -Monitor daily weights, intake and output and electrolytes closely -Consult to advanced heart failure team (transferred from [facility] to [facility]) -await recommendations   Type 2 diabetes mellitus with vascular complications, uncontrolled, insulin  requiring -Uncontrolled as evidenced by hemoglobin A1c greater than 9% -Continue basal insulin at lower dose and SSI -Carbohydrate modified diet, no concentrated sweets or fruit juices except to treat a low blood glucose   Atrial flutter/chronic atrial fibrillation -Patient's status post ablation -Heart rate currently controlled -Patient not anticoagulated due to history of frequent GI bleeding   Aortic stenosis s/p TAVR 3/22 -  stable    Anemia in chronic kidney disease  - Hg holding stable at 9.0.    Stage 3b CKD  - stable will follow closely with diuresis -await BMP for today    Family Communication/Anticipated D/C date and plan/Code Status   DVT prophylaxis: heparin Code Status: Full Code.  Family Communication: at bedside Disposition Plan: Status is: Inpatient  Remains inpatient appropriate because: needs further IV diuresis         Medical Consultants:   CHF team     Subjective:   Says he feels like his breathing is improving  Objective:    Vitals:   03/31/21 1830 03/31/21 1956 03/31/21 2339 04/01/21 0312  BP: 122/78 113/70 114/60 (!) 112/58  Pulse: 72 (!) 58 (!) 58 60  Resp: 18 16 16 17   Temp: 97.8 F (36.6 C) 98 F (36.7 C) 98 F (36.7 C) 98.1 F (36.7 C)  TempSrc: Oral Oral Oral Oral  SpO2:  94% 99% 100%  Weight: 98.3 kg   96.3 kg  Height: 6' (1.829 m)       Intake/Output Summary (Last 24 hours) at 04/01/2021 0912 Last data filed at 04/01/2021 0901 Gross per 24 hour  Intake 243 ml  Output 2775 ml  Net -5400 ml   Filed Weights   03/31/21 0854 03/31/21 1830 04/01/21 0312  Weight: 100.6 kg 98.3 kg 96.3 kg    Exam:  General: Appearance:     Overweight male in no acute distress     Lungs:     Not on O2, respirations unlabored  Heart:    Normal heart rate.   MS:   All extremities are intact. + LE edema R>L   Neurologic:   Awake, alert, oriented x 3. No apparent focal neurological           defect.      Data Reviewed:   I  have personally reviewed following labs and imaging studies:  Labs: Labs show the following:   Basic Metabolic Panel: Recent Labs  Lab 03/31/21 1020  NA 138  K 3.7  CL 102  CO2 27  GLUCOSE 69*  BUN 42*  CREATININE 2.25*  CALCIUM 8.9   GFR Estimated Creatinine Clearance: 34.1 mL/min (A) (by C-G formula based on SCr of 2.25 mg/dL (H)). Liver Function Tests: Recent Labs  Lab 03/31/21 1020  AST 32  ALT 15  ALKPHOS 188*  BILITOT 2.1*  PROT 7.0  ALBUMIN 3.2*   No results for input(s): LIPASE, AMYLASE in the last 168 hours. No results for input(s): AMMONIA in the last 168 hours. Coagulation profile No results for input(s): INR, PROTIME in the last 168 hours.  CBC: Recent Labs  Lab 03/31/21 1020 04/01/21 0346  WBC 5.6 5.3  NEUTROABS 3.4  --   HGB 9.0* 9.0*  HCT 27.7* 27.5*  MCV 81.7 78.6*  PLT 129* 122*   Cardiac Enzymes: No results for input(s): CKTOTAL, CKMB, CKMBINDEX, TROPONINI in the last 168 hours. BNP (last 3 results) No results for input(s): PROBNP in the last 8760 hours. CBG: Recent Labs  Lab 03/31/21 1713 03/31/21 2116 04/01/21 0309 04/01/21 0600  GLUCAP 152* 109* 123* 85   D-Dimer: No results for input(s): DDIMER in the last 72 hours. Hgb A1c: Recent Labs    03/31/21 1444  HGBA1C 6.2*   Lipid Profile: No results for input(s): CHOL, HDL, LDLCALC, TRIG, CHOLHDL, LDLDIRECT in the last 72 hours. Thyroid function studies: Recent Labs    03/31/21 1020  TSH 3.923   Anemia work up: No results for input(s): VITAMINB12, FOLATE, FERRITIN, TIBC, IRON, RETICCTPCT in the last 72 hours. Sepsis Labs: Recent Labs  Lab 03/31/21 1020 04/01/21 0346  WBC 5.6 5.3    Microbiology Recent Results (from the past 240 hour(s))  Resp Panel by RT-PCR (Flu A&B, Covid) Nasopharyngeal Swab     Status: None   Collection Time: 03/31/21  9:48 AM   Specimen: Nasopharyngeal Swab; Nasopharyngeal(NP) swabs in vial transport medium  Result Value Ref Range Status    SARS Coronavirus 2 by RT PCR NEGATIVE NEGATIVE Final    Comment: (NOTE) SARS-CoV-2 target nucleic acids are NOT DETECTED.  The SARS-CoV-2 RNA is generally detectable in upper respiratory specimens during the acute phase of infection. The lowest concentration of SARS-CoV-2 viral copies this assay can detect is 138 copies/mL. A negative result does not preclude SARS-Cov-2 infection and should not be used as the sole basis for treatment or other patient management decisions. A negative result may occur with  improper specimen collection/handling, submission of specimen other than nasopharyngeal swab, presence of viral mutation(s) within the areas targeted by this assay, and inadequate number of viral copies(<138 copies/mL). A negative result must be combined with clinical observations, patient history, and epidemiological information. The  expected result is Negative.  Fact Sheet for Patients:  EntrepreneurPulse.com.au  Fact Sheet for Healthcare Providers:  IncredibleEmployment.be  This test is no t yet approved or cleared by the [place] FDA and  has been authorized for detection and/or diagnosis of SARS-CoV-2 by FDA under an Emergency Use Authorization (EUA). This EUA will remain  in effect (meaning this test can be used) for the duration of the COVID-19 declaration under Section 564(b)(1) of the Act, 21 U.S.C.section 360bbb-3(b)(1), unless the authorization is terminated  or revoked sooner.       Influenza A by PCR NEGATIVE NEGATIVE Final   Influenza B by PCR NEGATIVE NEGATIVE Final    Comment: (NOTE) The Xpert Xpress SARS-CoV-2/FLU/RSV plus assay is intended as an aid in the diagnosis of influenza from Nasopharyngeal swab specimens and should not be used as a sole basis for treatment. Nasal washings and aspirates are unacceptable for Xpert Xpress SARS-CoV-2/FLU/RSV testing.  Fact Sheet for  Patients: EntrepreneurPulse.com.au  Fact Sheet for Healthcare Providers: IncredibleEmployment.be  This test is not yet approved or cleared by the [place] FDA and has been authorized for detection and/or diagnosis of SARS-CoV-2 by FDA under an Emergency Use Authorization (EUA). This EUA will remain in effect (meaning this test can be used) for the duration of the COVID-19 declaration under Section 564(b)(1) of the Act, 21 U.S.C. section 360bbb-3(b)(1), unless the authorization is terminated or revoked.  Performed at [facility], [address], [place], [place] [address]     Procedures and diagnostic studies:  DG Chest 2 View  Result Date: 03/31/2021 CLINICAL DATA:  BILATERAL rales, BILATERAL leg swelling, and shortness of breath for 2 weeks, history CHF, former smoker EXAM: CHEST - 2 VIEW COMPARISON:  12/30/2020 FINDINGS: LEFT subclavian pacemaker leads project at RIGHT atrium and RIGHT ventricle. Enlargement of cardiac silhouette with pulmonary vascular congestion post TAVR. Stable mediastinal contours. Accentuation of interstitial markings, better demonstrated on prior exam, likely chronic interstitial lung disease. Minimal atelectasis at posterior lung bases. No definite acute infiltrate, pleural effusion, or pneumothorax. No acute osseous findings. IMPRESSION: Enlargement of cardiac silhouette with pulmonary vascular congestion post pacemaker and TAVR. Chronic interstitial lung disease with minimal bibasilar atelectasis. No acute infiltrate. Electronically Signed   By: Lavonia Dana M.D.   On: 03/31/2021 10:14   DG Chest Port 1 View  Result Date: 04/01/2021 CLINICAL DATA:  Acute heart failure. EXAM: PORTABLE CHEST 1 VIEW COMPARISON:  Chest radiograph dated 08/29/2021. FINDINGS: There is cardiomegaly with vascular congestion and edema. Pneumonia is not excluded clinical correlation is recommended. No pleural effusion pneumothorax. Left pectoral  pacemaker device. Aortic valve repair. No acute osseous pathology. IMPRESSION: Cardiomegaly with mild vascular congestion and edema. Pneumonia is not excluded. Electronically Signed   By: Anner Crete M.D.   On: 04/01/2021 02:45    Medications:    aspirin EC  81 mg Oral Q breakfast   carvedilol  3.125 mg Oral BID WC   furosemide  80 mg Intravenous BID   heparin  5,000 Units Subcutaneous Q8H   insulin aspart  0-9 Units Subcutaneous TID WC   insulin glargine-yfgn  5 Units Subcutaneous QHS   linaclotide  145 mcg Oral QAC breakfast   metolazone  5 mg Oral Daily   pantoprazole  40 mg Oral Daily   potassium chloride SA  40 mEq Oral BID   sodium chloride flush  3 mL Intravenous Q12H   Continuous Infusions:  sodium chloride       LOS: 1 day  Geradine Girt  [facility]   How to contact the [facility] Attending or Consulting provider [place] or covering provider during after hours [place], for this patient?  Check the care team in [facility] and look for a) attending/consulting [facility] provider listed and b) the [facility] team listed Log into [email or web address] and use [place]'s universal password to access. If you do not have the password, please contact the hospital operator. Locate the [facility] provider you are looking for under [facility] and page to a number that you can be directly reached. If you still have difficulty reaching the provider, please page the [facility] (Director on Call) for the Hospitalists listed on amion for assistance.  04/01/2021, 9:12 AM

## 2021-04-01 NOTE — Consult Note (Signed)
Advanced Heart Failure Team Consult Note   Primary Physician: [name], MD PCP-Cardiologist:  [name], MD  Reason for Consultation: Acute on chronic   HPI:    John [name] is seen today for evaluation of a/c systolic HF at the request of Dr. Eliseo Squires.   Mr. Dusza is a 84 old male with chronic combined systolic and diastolic heart failure, nonischemic cardiomyopathy, complete heart block status post PPM, T2DM, hypertension, atrial flutter, stage IIIb CKD, severe AS status post TAVR  Underwent LHC/RHC 11/2019 showed no CAD.  Underwent TAVR in 04/2020.  Had GI bleed in 09/2020, Eliquis was held and EGD showed duodenal ulcer, underwent IR embolization.  He was admitted in October with decompensated heart failure, improved with IV diuresis.  Weight at discharge was 209 pounds.  Admitted in November with acute diverticulitis and GI bleed.  Echo 10/22 EF 30-35%  We saw him in clinic earlier this week with marked volume overload. Added metolazone to high-dose torsemide with little benefit. Presented to [facility] with volume overloaded. IV lasix started and transferred here.   Beginning to diurese on IV lasix. Denies SOB, orthopnea or PND. Feels like edema starting to improve.   Review of Systems: [y] = yes, [ ]  = no   General: Weight gain [ ] ; Weight loss [ ] ; Anorexia [ ] ; Fatigue [ y]; Fever [ ] ; Chills [ ] ; Weakness [ ]   Cardiac: Chest pain/pressure [ ] ; Resting SOB [ ] ; Exertional SOB [ y]; Orthopnea [ ] ; Pedal Edema Blue.Reese ]; Palpitations [ ] ; Syncope [ ] ; Presyncope [ ] ; Paroxysmal nocturnal dyspnea[ ]   Pulmonary: Cough [ ] ; Wheezing[ ] ; Hemoptysis[ ] ; Sputum [ ] ; Snoring [ ]   GI: Vomiting[ ] ; Dysphagia[ ] ; Melena[ ] ; Hematochezia [ ] ; Heartburn[ ] ; Abdominal pain [ ] ; Constipation [ ] ; Diarrhea [ ] ; BRBPR [ ]   GU: Hematuria[ ] ; Dysuria [ ] ; Nocturia[ ]   Vascular: Pain in legs with walking [ ] ; Pain in feet with lying flat [ ] ; Non-healing sores [ ] ; Stroke [ ] ; TIA [ ] ; Slurred speech  [ ] ;  Neuro: Headaches[ ] ; Vertigo[ ] ; Seizures[ ] ; Paresthesias[ ] ;Blurred vision [ ] ; Diplopia [ ] ; Vision changes [ ]   Ortho/Skin: Arthritis Blue.Reese ]; Joint pain [ y]; Muscle pain [ ] ; Joint swelling [ ] ; Back Pain [ ] ; Rash [ ]   Psych: Depression[ ] ; Anxiety[ ]   Heme: Bleeding problems [ ] ; Clotting disorders [ ] ; Anemia [ ]   Endocrine: Diabetes [ y]; Thyroid dysfunction[ ]   Home Medications Prior to Admission medications   Medication Sig Start Date End Date Taking? Authorizing Provider  acetaminophen (TYLENOL) 325 MG tablet Take 2 tablets (650 mg total) by mouth every 4 (four) hours as needed for headache or mild pain. 05/15/20  Yes Isaiah Serge, NP  allopurinol (ZYLOPRIM) 300 MG tablet Take 150 mg by mouth as needed (gout flare).   Yes [provider]  aspirin EC 81 MG tablet Take 1 tablet (81 mg total) by mouth daily with breakfast. 10/25/20 10/25/21 Yes Emokpae, Courage, MD  benzonatate (TESSALON) 100 MG capsule Take 1 capsule (100 mg total) by mouth 3 (three) times daily as needed for cough. 09/27/20  Yes Evans Lance, MD  camphor-menthol (ANTI-ITCH) lotion Apply 1 application topically daily as needed for itching.   Yes [provider]  carvedilol (COREG) 3.125 MG tablet Take 1 tablet (3.125 mg total) by mouth 2 (two) times daily with a meal. 01/30/21  Yes Milford, Purdin,  FNP  fexofenadine (ALLEGRA) 180 MG tablet Take 1 tablet by mouth daily.   Yes [provider]  hydrOXYzine (ATARAX) 10 MG tablet Take 10 mg by mouth 3 (three) times daily as needed for itching.  04/09/21 Yes [provider]  insulin degludec (TRESIBA FLEXTOUCH) 100 UNIT/ML FlexTouch Pen Inject 12 Units into the skin at bedtime. 02/13/21  Yes Johnson, Clanford L, MD  linaclotide (LINZESS) 145 MCG CAPS capsule Take 1 capsule (145 mcg total) by mouth daily before breakfast. 01/26/21  Yes Montez Morita, Mcdaniel Ohms, MD  metolazone (ZAROXOLYN) 2.5 MG tablet Take 1 tablet (2.5 mg total) by  mouth once a week. Every Monday 02/20/21  Yes Johnson, Clanford L, MD  Multiple Vitamins-Minerals (ONE-A-DAY MENS 50+) TABS Take 1 tablet by mouth daily with breakfast. Men   Yes [provider]  pantoprazole (PROTONIX) 40 MG tablet Take 1 tablet (40 mg total) by mouth daily. 02/12/21  Yes Johnson, Clanford L, MD  potassium chloride SA (KLOR-CON M20) 20 MEQ tablet Take 2 tablets (40 mEq total) by mouth 2 (two) times daily. 03/22/21  Yes Vickie Epley, MD  spironolactone (ALDACTONE) 25 MG tablet Take 0.5 tablets (12.5 mg total) by mouth daily. 02/13/21  Yes Johnson, Clanford L, MD  torsemide (DEMADEX) 20 MG tablet Take 3 tablets (60 mg total) by mouth 2 (two) times daily. 03/22/21  Yes Vickie Epley, MD  TRULICITY 1.5 VW/0.9WJ SOPN INJECT 1.5MG (1 PEN) SUBCUTANEOUSLY EVERY WEEK Patient taking differently: Inject 1.5 mg into the skin once a week. 03/28/21  Yes Nida, Marella Chimes, MD  ACCU-CHEK AVIVA PLUS test strip TEST BLOOD SUGAR TWICE DAILY BEFORE BREAKFAST AND AT BEDTIME 07/19/20   Cassandria Anger, MD  colchicine 0.6 MG tablet Take 1 tablet (0.6 mg total) by mouth daily as needed. Patient not taking: Reported on 03/31/2021 05/15/20   Isaiah Serge, NP  Insulin Pen Needle (BD PEN NEEDLE NANO U/F) 32G X 4 MM MISC 1 each by Does not apply route 4 (four) times daily. 12/23/18   Cassandria Anger, MD  metolazone (ZAROXOLYN) 5 MG tablet Take 1 tablet (5 mg total) by mouth daily. Patient not taking: Reported on 03/31/2021 03/30/21 06/28/21  Rafael Bihari, FNP    Past Medical History: Past Medical History:  Diagnosis Date   Atrial flutter ([place]) 12/2010   Admitted with symptomatic bradycardia (HR 40s), atrial flutter with slow ventricular response 12/2010 + volume overload; AV nodal agents d/c'd and Pradaxa started; RFA in 01/2011   Chronic kidney disease, stage 3b (HCC)    Chronic systolic CHF (congestive heart failure) (HCC)    Class 2 severe obesity due to excess calories  with serious comorbidity and body mass index (BMI) of 37.0 to 37.9 in adult ([place]) 07/17/2017   Diabetes mellitus    non insulin dependant   Hematoma, chest wall 05/15/2020   Hyperlipidemia    Hypertension    Mitral regurgitation    NASH (nonalcoholic steatohepatitis)    NICM (nonischemic cardiomyopathy) (HCC)    Osteoarthritis    Polyarticular gout 05/15/2020   Presence of permanent cardiac pacemaker    PSVT (paroxysmal supraventricular tachycardia) (HCC)    Possibly atrial flutter   S/P TAVR (transcatheter aortic valve replacement) 05/03/2020   s/p TAVR with a 29 mm Edwards S3U via the subclavian approach with Dr. Angelena Form & Dr. Cyndia Bent    Severe aortic stenosis     Past Surgical History: Past Surgical History:  Procedure Laterality Date   ATRIAL FLUTTER ABLATION  N/A 02/07/2011   Procedure: ATRIAL FLUTTER ABLATION;  Surgeon: Evans Lance, MD;  Location[facility] CATH LAB;  Service: Cardiovascular;  Laterality: N/A;   BIV UPGRADE N/A 06/08/2020   Procedure: BIV PPM UPGRADE;  Surgeon: Evans Lance, MD;  Location: [place] CV LAB;  Service: Cardiovascular;  Laterality: N/A;   CARDIAC ELECTROPHYSIOLOGY STUDY AND ABLATION  02/07/2011   CARDIOVERSION N/A 03/23/2016   Procedure: CARDIOVERSION;  Surgeon: Evans Lance, MD;  Location: [place];  Service: Cardiovascular;  Laterality: N/A;   COLONOSCOPY N/A 04/17/2017   Rehman: 3 polyps in [facility], diverticulosis   COLONOSCOPY N/A 02/23/2021   Procedure: COLONOSCOPY;  Surgeon: Rogene Houston, MD;  [facility];  Service: Endoscopy;  Laterality: N/A;  12:15   EP IMPLANTABLE DEVICE N/A 08/29/2015   Procedure: Pacemaker Implant;  Surgeon: Evans Lance, MD;  Location: [place] CV LAB;  Service: Cardiovascular;  Laterality: N/A;   EP IMPLANTABLE DEVICE N/A 12/07/2015   Procedure: PPM Lead Revision/Repair;  Surgeon: Evans Lance, MD;  Location: [place] CV LAB;  Service: Cardiovascular;  Laterality: N/A;    ESOPHAGOGASTRODUODENOSCOPY (EGD) WITH PROPOFOL N/A 10/18/2020   Castaneda: normal esophagus, erythematous mucosa in gastric body, non bleeding duodenal ulcer with a non bleeding visible vessel (forrest Class IIa). Injected, clip placed, no specimens collected.   ESOPHAGOGASTRODUODENOSCOPY (EGD) WITH PROPOFOL N/A 01/27/2021   Procedure: ESOPHAGOGASTRODUODENOSCOPY (EGD) WITH PROPOFOL;  Surgeon: [name], DO;  [facility];  Service: Endoscopy;  Laterality: N/A;   ESOPHAGOGASTRODUODENOSCOPY (EGD) WITH PROPOFOL N/A 02/09/2021   Procedure: ESOPHAGOGASTRODUODENOSCOPY (EGD) WITH PROPOFOL;  Surgeon: [name], DO;  [facility];  Service: Endoscopy;  Laterality: N/A;   GIVENS CAPSULE STUDY N/A 02/07/2021   Procedure: GIVENS CAPSULE STUDY;  Surgeon: Harvel Quale, MD;  [facility];  Service: Gastroenterology;  Laterality: N/A;   IR ANGIOGRAM SELECTIVE EACH ADDITIONAL VESSEL  10/19/2020   IR ANGIOGRAM SELECTIVE EACH ADDITIONAL VESSEL  10/19/2020   IR ANGIOGRAM VISCERAL SELECTIVE  10/19/2020   IR ANGIOGRAM VISCERAL SELECTIVE  10/19/2020   IR EMBO ART  VEN HEMORR LYMPH EXTRAV  INC GUIDE ROADMAPPING  10/19/2020   IR US GUIDE VASC ACCESS RIGHT  10/19/2020   KNEE ARTHROSCOPY  03/27/2003   left   LUMBAR SPINE SURGERY     "I've had 6 ORs 1972 thru 2004"   POLYPECTOMY  04/17/2017   Procedure: POLYPECTOMY;  Surgeon: Rogene Houston, MD;  [facility];  Service: Endoscopy;;  transverse colon x3;   RIGHT HEART CATH N/A 04/28/2020   Procedure: RIGHT HEART CATH;  Surgeon: Jolaine Artist, MD;  Location: [place] CV LAB;  Service: Cardiovascular;  Laterality: N/A;   RIGHT/LEFT HEART CATH AND CORONARY ANGIOGRAPHY N/A 12/21/2019   Procedure: RIGHT/LEFT HEART CATH AND CORONARY ANGIOGRAPHY;  Surgeon: Nelva Bush, MD;  Location: [place] CV LAB;  Service: Cardiovascular;  Laterality: N/A;   TEE WITHOUT CARDIOVERSION N/A 11/05/2019    Procedure: TRANSESOPHAGEAL ECHOCARDIOGRAM (TEE) WITH PROPOFOL;  Surgeon: Arnoldo Lenis, MD;  [facility];  Service: Endoscopy;  Laterality: N/A;   TEE WITHOUT CARDIOVERSION N/A 05/03/2020   Procedure: TRANSESOPHAGEAL ECHOCARDIOGRAM (TEE);  Surgeon: Burnell Blanks, MD;  Location: [place];  Service: Open Heart Surgery;  Laterality: N/A;    Family History: Family History  Problem Relation Age of Onset   Heart attack Mother    Hypertension Mother    Heart attack Father  Hypertension Father    Heart attack Brother    Colon cancer Neg Hx     Social History: Social History   Socioeconomic History   Marital status: Married    Spouse name: Not on file   Number of children: 2   Years of education: Not on file   Highest education level: Not on file  Occupational History   Occupation: Designer, industrial/product    Employer: RETIRED  Tobacco Use   Smoking status: Former    Packs/day: 1.00    Years: 10.00    Pack years: 10.00    Types: Cigarettes    Quit date: 03/26/1986    Years since quitting: 35.0   Smokeless tobacco: Never   Tobacco comments:    "stopped cigarette  smoking 1988"  Vaping Use   Vaping Use: Never used  Substance and Sexual Activity   Alcohol use: Not Currently    Alcohol/week: 0.0 standard drinks    Comment: "quit alcohol ~ 2007"   Drug use: No   Sexual activity: Yes    Partners: Female  Other Topics Concern   Not on file  Social History Narrative   Not on file   Social Determinants of Health   Financial Resource Strain: Not on file  Food Insecurity: No Food Insecurity   Worried About [place] in the Last Year: Never true   [place] in the Last Year: Never true  Transportation Needs: No Transportation Needs   Lack of Transportation (Medical): No   Lack of Transportation (Non-Medical): No  Physical Activity: Not on file  Stress: Not on file  Social Connections: Not on file    Allergies:  No Known  Allergies  Objective:    Vital Signs:   Temp:  [97.6 F (36.4 C)-98.2 F (36.8 C)] 97.6 F (36.4 C) (01/07 1120) Pulse Rate:  [58-79] 62 (01/07 1120) Resp:  [14-18] 16 (01/07 1120) BP: (108-122)/(55-78) 108/55 (01/07 1120) SpO2:  [94 %-100 %] 98 % (01/07 1120) Weight:  [96.3 kg-98.3 kg] 96.3 kg (01/07 0312) Last BM Date: 04/01/21  Weight change: Filed Weights   03/31/21 0854 03/31/21 1830 04/01/21 0312  Weight: 100.6 kg 98.3 kg 96.3 kg    Intake/Output:   Intake/Output Summary (Last 24 hours) at 04/01/2021 1547 Last data filed at 04/01/2021 1405 Gross per 24 hour  Intake 723 ml  Output 3825 ml  Net -3102 ml      Physical Exam    General:  Sitting up in bedNo resp difficulty HEENT: normal Neck: supple. JVP to jaw  . Carotids 2+ bilat; no bruits. No lymphadenopathy or thyromegaly appreciated. Cor: PMI nondisplaced. Irregular rate & rhythm. No rubs, gallops or murmurs. Lungs: clear Abdomen: soft, nontender, + distended. No hepatosplenomegaly. No bruits or masses. Good bowel sounds. Extremities: no cyanosis, clubbing, rash, trace -1+ edema Neuro: alert & orientedx3, cranial nerves grossly intact. moves all 4 extremities w/o difficulty. Affect pleasant   Telemetry   AF with RV pacing 60s Personally reviewed  EKG    AF 62 RV pacing Personally reviewed   Labs   Basic Metabolic Panel: Recent Labs  Lab 03/31/21 1020 04/01/21 0843  NA 138 138  K 3.7 4.1  CL 102 99  CO2 27 29  GLUCOSE 69* 64*  BUN 42* 38*  CREATININE 2.25* 2.05*  CALCIUM 8.9 9.2    Liver Function Tests: Recent Labs  Lab 03/31/21 1020  AST 32  ALT 15  ALKPHOS 188*  BILITOT 2.1*  PROT 7.0  ALBUMIN 3.2*   No results for input(s): LIPASE, AMYLASE in the last 168 hours. No results for input(s): AMMONIA in the last 168 hours.  CBC: Recent Labs  Lab 03/31/21 1020 04/01/21 0346  WBC 5.6 5.3  NEUTROABS 3.4  --   HGB 9.0* 9.0*  HCT 27.7* 27.5*  MCV 81.7 78.6*  PLT 129* 122*     Cardiac Enzymes: No results for input(s): CKTOTAL, CKMB, CKMBINDEX, TROPONINI in the last 168 hours.  BNP: BNP (last 3 results) Recent Labs    02/06/21 1300 02/21/21 1431 03/31/21 1018  BNP 1,516.0* 1,988.6* 2,067.0*    ProBNP (last 3 results) No results for input(s): PROBNP in the last 8760 hours.   CBG: Recent Labs  Lab 03/31/21 1713 03/31/21 2116 04/01/21 0309 04/01/21 0600 04/01/21 1117  GLUCAP 152* 109* 123* 85 158*    Coagulation Studies: No results for input(s): LABPROT, INR in the last 72 hours.   Imaging   DG Chest Port 1 View  Result Date: 04/01/2021 CLINICAL DATA:  Acute heart failure. EXAM: PORTABLE CHEST 1 VIEW COMPARISON:  Chest radiograph dated 08/29/2021. FINDINGS: There is cardiomegaly with vascular congestion and edema. Pneumonia is not excluded clinical correlation is recommended. No pleural effusion pneumothorax. Left pectoral pacemaker device. Aortic valve repair. No acute osseous pathology. IMPRESSION: Cardiomegaly with mild vascular congestion and edema. Pneumonia is not excluded. Electronically Signed   By: Anner Crete M.D.   On: 04/01/2021 02:45     Medications:     Current Medications:  aspirin EC  81 mg Oral Q breakfast   carvedilol  3.125 mg Oral BID WC   furosemide  80 mg Intravenous BID   heparin  5,000 Units Subcutaneous Q8H   insulin aspart  0-9 Units Subcutaneous TID WC   insulin glargine-yfgn  5 Units Subcutaneous QHS   linaclotide  145 mcg Oral QAC breakfast   metolazone  5 mg Oral Daily   pantoprazole  40 mg Oral Daily   potassium chloride SA  40 mEq Oral BID   sodium chloride flush  3 mL Intravenous Q12H    Infusions:  sodium chloride        Assessment/Plan   1. Acute on Chronic Systolic Heart Failure/NICM: - Echo 08/2015 EF 50-55% (PPM placed in 08/2015)  - Echo 3/22 EF 25-30% TAVR ok. Mod MR .-LHC 9/21 that showed no coronary disease.  - based on timing of drop in EF and 42% pacing percentage, concern  for RV paced CM +AS.Left subclavian vein occluded -> unable to upgrade to CRT. - s/p TAVR 2/22 - Echo 10/22 EF 30-35% - Now with marked volume overload c/b cardiorenal syndrome. Not responding to intensification of po diuretics - Diuresing well with IV lasix. Volume status looks much better. SCr improving. - Continue IV lasix + TED hose   2. Acute on chronic Stage IIIb CKD due to cardiorenal syndrome  - Followed by nephrology, Dr. Theador Hawthorne.   - SCr 1.4-1.5 in 8/22 - Scr 2.25 on admit -> 2.0 today - Follow with daily BMET - Volume becoming more difficult to control, likely will need discussion regarding dialysis sooner rather than later.   3. Mitral Regurgitation  - Moderate by TEE 8/21  - mild to moderate on echo 10/22   4. Aortic Stenosis  - s/p TAVR 3/22. - Stable on echo.    5. Atrial Flutter/ Chronic Atrial Fibrillation - s/p AFL ablation.  - In chronic Afib, rate controlled.  - s/p frequent GI bleed  so off Eliquis.  - CHA2DS2-VASc Score = at least 5  - Unable to safely restart Eliquis at this time.  - EP felt not a Watchman candidate.   6. T2DM - On insulin. - Off Farxiga due to yeast infection   7. H/o CHB  - s/p PPM followed by Dr. Lovena Le  - ? RV paced CM. He's pacing < 10%. Had drop in EF 2 months after pacer was placed.  - Following with Dr.Taylor. Attempted CRT upgrade 3/22 but left subclavian vein occluded.   8. UGIB with iron deficiency anemia - EGD in 7/22 with duodenal ulcer with visible vessel. Underwent coiling of GDA.  - s/p EGD 11/22 with no active bleed; received 4 U PRBCs. - No evidence of ongoing bleeding  - check iron stores  Length of Stay: 1  Glori Bickers, MD  04/01/2021, 3:47 PM  Advanced Heart Failure Team Pager [phone] (M-F; 7a - 5p)  Please contact [facility] for night-coverage after hours (4p -7a ) and weekends on [email or web address]

## 2021-04-01 NOTE — Evaluation (Signed)
Physical Therapy Evaluation Patient Details Name: John [name] MRN: [id number] DOB: 21-May-1945 Today's Date: 04/01/2021  History of Present Illness  The pt is a 76 yo male presenting 1/6 with BLE swelling and work-up suggests volume overload due to acute HF exacerbation. PMH includes: atrial flutter, CKD III, CHF, DM II, HLD, HTN, nonischemic cardiomyopathy, TAVR 04/2020, and obesity.   Clinical Impression  Pt in bed upon arrival of PT, agreeable to evaluation at this time. Prior to admission the pt was mobilizing within the home using RW or a SPC depending on the day, but reports no recent falls and independence with self-care at home. The pt now presents with limitations in functional mobility, LE power, endurance, and dynamic stability due to above dx, and will continue to benefit from skilled PT to address these deficits. He was able to complete sit-stand transfers from various heights, but requires increased assist and UE support to power up from low surfaces. He completed ~75 ft ambulation in the hallway with use of RW, no AD, and with single UE support on the rail. At this time he requires BUE support for best stability, discussed progressive walking program and role of HH therapies with the pt and his wife to facilitate return to full independence and reduced risk of falls. Both the pt and his wife are in agreement with this plan, will continue to benefit from skilled PT acutely as well as HHPT following d/c.  Gait Speed: 0.66ms using RW and with minG (gait speed <0.64m indicates increased risk of falls and dependence in ADLs)       Recommendations for follow up therapy are one component of a multi-disciplinary discharge planning process, led by the attending physician.  Recommendations may be updated based on patient status, additional functional criteria and insurance authorization.  Follow Up Recommendations Home health PT    Assistance Recommended at Discharge Intermittent  Supervision/Assistance  Patient can return home with the following  A little help with walking and/or transfers;Assistance with cooking/housework    Equipment Recommendations None recommended by PT (pt has needed DME)  Recommendations for Other Services       Functional Status Assessment Patient has had a recent decline in their functional status and demonstrates the ability to make significant improvements in function in a reasonable and predictable amount of time.     Precautions / Restrictions Precautions Precautions: Fall Restrictions Weight Bearing Restrictions: No      Mobility  Bed Mobility Overal bed mobility: Independent                  Transfers Overall transfer level: Needs assistance   Transfers: Sit to/from Stand Sit to Stand: Min assist;From elevated surface           General transfer comment: minA from elevated EOB, min-modA from low toilet    Ambulation/Gait Ambulation/Gait assistance: Min guard Gait Distance (Feet): 15 Feet (+ 75) Assistive device: Rolling walker (2 wheels) Gait Pattern/deviations: Step-through pattern;Decreased stride length Gait velocity: 0.4 m/s Gait velocity interpretation: <1.8 ft/sec, indicate of risk for recurrent falls   General Gait Details: pt with slow but steady gait with RW and BUE support. slowed gait with increased sway when UE support removed     Balance Overall balance assessment: Mild deficits observed, not formally tested  Pertinent Vitals/Pain Pain Assessment: No/denies pain    Home Living Family/patient expects to be discharged to:: Private residence Living Arrangements: Spouse/significant other Available Help at Discharge: Family;Available 24 hours/day Type of Home: House Home Access: Stairs to enter Entrance Stairs-Rails: None Entrance Stairs-Number of Steps: 1   Home Layout: Able to live on main level with bedroom/bathroom;Two  level;Laundry or work area in [place]: Conservation officer, nature (2 wheels);Shower seat - built in;Grab bars - tub/shower      Prior Function Prior Level of Function : Needs assist       Physical Assist : Mobility (physical);ADLs (physical) Mobility (physical): Gait ADLs (physical): Bathing;Dressing;IADLs Mobility Comments: uses RW or cane depending on the day at home, reports some ambulation without DME. ADLs Comments: reports cooking and dressing for himself without issue     Hand Dominance   Dominant Hand: Right    Extremity/Trunk Assessment   Upper Extremity Assessment Upper Extremity Assessment: Overall WFL for tasks assessed    Lower Extremity Assessment Lower Extremity Assessment: Overall WFL for tasks assessed (slightly impaired LE power, but able to complete transfers and hallway ambulation with minimal assist, grossly 4/5 to mmt)    Cervical / Trunk Assessment Cervical / Trunk Assessment: Normal  Communication   Communication: No difficulties  Cognition Arousal/Alertness: Awake/alert Behavior During Therapy: Flat affect;WFL for tasks assessed/performed Overall Cognitive Status: Within Functional Limits for tasks assessed                                 General Comments: pt with flat affect, but able to follow all cues given and express needs. demos good insight to safety        General Comments General comments (skin integrity, edema, etc.): VSS on RA        Assessment/Plan    PT Assessment Patient needs continued PT services  PT Problem List Decreased strength;Decreased activity tolerance;Decreased range of motion;Decreased balance;Decreased mobility;Decreased coordination       PT Treatment Interventions DME instruction;Gait training;Stair training;Functional mobility training;Therapeutic activities;Therapeutic exercise;Balance training;Patient/family education    PT Goals (Current goals can be found in the Care Plan section)   Acute Rehab PT Goals Patient Stated Goal: return home PT Goal Formulation: With patient Time For Goal Achievement: 04/15/21 Potential to Achieve Goals: Good    Frequency Min 3X/week        AM-PAC PT "6 Clicks" Mobility  Outcome Measure Help needed turning from your back to your side while in a flat bed without using bedrails?: None Help needed moving from lying on your back to sitting on the side of a flat bed without using bedrails?: None Help needed moving to and from a bed to a chair (including a wheelchair)?: A Little Help needed standing up from a chair using your arms (e.g., wheelchair or bedside chair)?: A Little Help needed to walk in hospital room?: A Little Help needed climbing 3-5 steps with a railing? : A Little 6 Click Score: 20    End of Session Equipment Utilized During Treatment: Gait belt Activity Tolerance: Patient tolerated treatment well Patient left: in bed;with call bell/phone within reach;with bed alarm set;with family/visitor present Nurse Communication: Mobility status PT Visit Diagnosis: Other abnormalities of gait and mobility (R26.89);Muscle weakness (generalized) (M62.81)    Time: 7902-4097 PT Time Calculation (min) (ACUTE ONLY): 36 min   Charges:   PT Evaluation $PT Eval Low Complexity: 1 Low PT Treatments $Therapeutic Exercise: 8-22 mins  [place], PT, DPT   Acute Rehabilitation Department Pager #: [phone]  Sandra Cockayne 04/01/2021, 3:29 PM

## 2021-04-02 DIAGNOSIS — I5021 Acute systolic (congestive) heart failure: Secondary | ICD-10-CM | POA: Diagnosis not present

## 2021-04-02 DIAGNOSIS — I5043 Acute on chronic combined systolic (congestive) and diastolic (congestive) heart failure: Secondary | ICD-10-CM | POA: Diagnosis not present

## 2021-04-02 DIAGNOSIS — N1832 Chronic kidney disease, stage 3b: Secondary | ICD-10-CM | POA: Diagnosis not present

## 2021-04-02 DIAGNOSIS — E1159 Type 2 diabetes mellitus with other circulatory complications: Secondary | ICD-10-CM | POA: Diagnosis not present

## 2021-04-02 LAB — BASIC METABOLIC PANEL
Anion gap: 12 (ref 5–15)
BUN: 41 mg/dL — ABNORMAL HIGH (ref 8–23)
CO2: 29 mmol/L (ref 22–32)
Calcium: 8.8 mg/dL — ABNORMAL LOW (ref 8.9–10.3)
Chloride: 96 mmol/L — ABNORMAL LOW (ref 98–111)
Creatinine, Ser: 2.15 mg/dL — ABNORMAL HIGH (ref 0.61–1.24)
GFR, Estimated: 31 mL/min — ABNORMAL LOW (ref >60)
Glucose, Bld: 74 mg/dL (ref 70–99)
Potassium: 4 mmol/L (ref 3.5–5.1)
Sodium: 137 mmol/L (ref 135–145)

## 2021-04-02 LAB — GLUCOSE, CAPILLARY
Glucose-Capillary: 87 mg/dL (ref 70–99)
Glucose-Capillary: 65 mg/dL — ABNORMAL LOW (ref 70–99)
Glucose-Capillary: 86 mg/dL (ref 70–99)
Glucose-Capillary: 141 mg/dL — ABNORMAL HIGH (ref 70–99)
Glucose-Capillary: 127 mg/dL — ABNORMAL HIGH (ref 70–99)
Glucose-Capillary: 166 mg/dL — ABNORMAL HIGH (ref 70–99)

## 2021-04-02 LAB — CBC
HCT: 26.7 % — ABNORMAL LOW (ref 39.0–52.0)
Hemoglobin: 8.9 g/dL — ABNORMAL LOW (ref 13.0–17.0)
MCH: 26.1 pg (ref 26.0–34.0)
MCHC: 33.3 g/dL (ref 30.0–36.0)
MCV: 78.3 fL — ABNORMAL LOW (ref 80.0–100.0)
Platelets: 123 10*3/uL — ABNORMAL LOW (ref 150–400)
RBC: 3.41 MIL/uL — ABNORMAL LOW (ref 4.22–5.81)
RDW: 22.8 % — ABNORMAL HIGH (ref 11.5–15.5)
WBC: 6.2 10*3/uL (ref 4.0–10.5)
nRBC: 0 % (ref 0.0–0.2)

## 2021-04-02 LAB — MAGNESIUM: Magnesium: 2.3 mg/dL (ref 1.7–2.4)

## 2021-04-02 NOTE — Evaluation (Signed)
Occupational Therapy Evaluation Patient Details Name: John [name] MRN: [id number] DOB: 1945/04/21 Today's Date: 04/02/2021   History of Present Illness The pt is a 76 yo male presenting 1/6 with BLE swelling and work-up suggests volume overload due to acute HF exacerbation. PMH includes: atrial flutter, CKD III, CHF, DM II, HLD, HTN, nonischemic cardiomyopathy, TAVR 04/2020, and obesity.   Clinical Impression   Pt received seated EOB engaged in performing ADLs with assist from wife. He is using a walker to aid in his mobility and pt's wife demonstrates ability to provide all needed assistance. Pt reports fluctuating mobility with intermittent use of AD at home (RW or cane but able to mobilize without AD when feeling well.) He has had minimal change in his indep with ADLs as his wife provides him assistance at home including for bathing and LB dressing. Discussed role of acute OT and pt/spouse in agreement with discontinuing acute OT as currently with no needs. Pt and spouse with excellent understanding of use of AE as well as having modifications in their home to maintain safety. Spouse maintains medications at baseline. Will sign off based on the above listed information. Recommend resuming HHPT at d/c.      Recommendations for follow up therapy are one component of a multi-disciplinary discharge planning process, led by the attending physician.  Recommendations may be updated based on patient status, additional functional criteria and insurance authorization.   Follow Up Recommendations  Other (comment) (resume previous in home therapy)    Assistance Recommended at Discharge Intermittent Supervision/Assistance  Patient can return home with the following A little help with bathing/dressing/bathroom;Direct supervision/assist for medications management    Functional Status Assessment  Patient has had a recent decline in their functional status and demonstrates the ability to make significant  improvements in function in a reasonable and predictable amount of time.  Equipment Recommendations  None recommended by OT       Precautions / Restrictions Precautions Precautions: Fall Restrictions Weight Bearing Restrictions: No      Mobility Bed Mobility Overal bed mobility: Independent                  Transfers Overall transfer level: Needs assistance Equipment used: Rolling walker (2 wheels) Transfers: Sit to/from Stand Sit to Stand: Min guard (wife provided assistance)                  Balance Overall balance assessment: Mild deficits observed, not formally tested                 ADL either performed or assessed with clinical judgement   ADL Overall ADL's : At baseline                 Vision Baseline Vision/History: 1 Wears glasses Ability to See in Adequate Light: 0 Adequate Patient Visual Report: No change from baseline Vision Assessment?: No apparent visual deficits     Perception     Praxis      Pertinent Vitals/Pain Pain Assessment: No/denies pain     Hand Dominance Right   Extremity/Trunk Assessment Upper Extremity Assessment Upper Extremity Assessment: Overall WFL for tasks assessed           Communication Communication Communication: No difficulties   Cognition Arousal/Alertness: Awake/alert Behavior During Therapy: WFL for tasks assessed/performed Overall Cognitive Status: Within Functional Limits for tasks assessed                       General  Comments  reports itching to back, wife says he has trouble with this at home, applied cream to improve pt comfort            Home Living Family/patient expects to be discharged to:: Private residence Living Arrangements: Spouse/significant other Available Help at Discharge: Family;Available 24 hours/day Type of Home: House Home Access: Stairs to enter [employer] of Steps: 1 Entrance Stairs-Rails: None Home Layout: Able to live on main  level with bedroom/bathroom;Two level;Laundry or work area in basement     [employer] Shower/Tub: Occupational psychologist: Handicapped height Bathroom Accessibility: Yes   Home Equipment: Conservation officer, nature (2 wheels);Shower seat - built in;Grab bars - tub/shower          Prior Functioning/Environment Prior Level of Function : Needs assist       Physical Assist : Mobility (physical);ADLs (physical) Mobility (physical): Gait ADLs (physical): Bathing;Dressing;IADLs Mobility Comments: uses RW or cane depending on the day at home, reports some ambulation without DME. ADLs Comments: reports cooking and dressing for himself without issue        OT Problem List: Decreased activity tolerance      OT Treatment/Interventions: Self-care/ADL training    OT Goals(Current goals can be found in the care plan section) Acute Rehab OT Goals Patient Stated Goal: increase strength and mobility OT Goal Formulation: With patient/family  OT Frequency:   No acute services indicated at this time   AM-PAC OT "6 Clicks" Daily Activity     Outcome Measure Help from another person eating meals?: None Help from another person taking care of personal grooming?: None Help from another person toileting, which includes using toliet, bedpan, or urinal?: A Lot Help from another person bathing (including washing, rinsing, drying)?: A Lot Help from another person to put on and taking off regular upper body clothing?: A Little Help from another person to put on and taking off regular lower body clothing?: A Lot 6 Click Score: 17   End of Session Equipment Utilized During Treatment: Rolling walker (2 wheels)  Activity Tolerance: Patient tolerated treatment well Patient left: with family/visitor present;with call bell/phone within reach;Other (comment) (seated EOB)  OT Visit Diagnosis: Unsteadiness on feet (R26.81)                Time: 8756-4332 OT Time Calculation (min): 21 min Charges:  OT  General Charges $OT Visit: 1 Visit OT Evaluation $OT Eval Low Complexity: 1 Low    Kasandra Knudsen, OTR/L 04/02/2021, 10:21 AM

## 2021-04-02 NOTE — Progress Notes (Signed)
Advanced Heart Failure Rounding Note   Subjective:    Continues to diurese well. Weight down another 3 pounds (12 pounds total) to 209 which is his baseline weight.   Feeling much better. Denies SOB, orthopnea or PND.   Scr 2.25 -> 2.05 -> 2.15  Objective:   Weight Range:  Vital Signs:   Temp:  [97.6 F (36.4 C)-98.1 F (36.7 C)] 97.9 F (36.6 C) (01/08 1134) Pulse Rate:  [60-64] 60 (01/08 1134) Resp:  [16-19] 16 (01/08 1134) BP: (100-112)/(50-73) 107/62 (01/08 1134) SpO2:  [96 %-100 %] 99 % (01/08 1134) Weight:  [94.9 kg] 94.9 kg (01/08 0318) Last BM Date: 04/01/21  Weight change: Filed Weights   03/31/21 1830 04/01/21 0312 04/02/21 0318  Weight: 98.3 kg 96.3 kg 94.9 kg    Intake/Output:   Intake/Output Summary (Last 24 hours) at 04/02/2021 1532 Last data filed at 04/02/2021 1411 Gross per 24 hour  Intake 3083 ml  Output 2925 ml  Net 158 ml     Physical Exam: General:  Lying flat in bed. No resp difficulty HEENT: normal Neck: supple. JVP 9Carotids 2+ bilat; no bruits. No lymphadenopathy or thryomegaly appreciated. Cor: PMI nondisplaced. Regular rate & rhythm. No rubs, gallops or murmurs. Lungs: clear Abdomen: soft, nontender, nondistended. No hepatosplenomegaly. No bruits or masses. Good bowel sounds. Extremities: no cyanosis, clubbing, rash, tr-1+ edema Neuro: alert & orientedx3, cranial nerves grossly intact. moves all 4 extremities w/o difficulty. Affect pleasant  Telemetry: AF v-paced 60 Personally reviewed   Labs: Basic Metabolic Panel: Recent Labs  Lab 03/31/21 1020 04/01/21 0843 04/02/21 0359  NA 138 138 137  K 3.7 4.1 4.0  CL 102 99 96*  CO2 27 29 29   GLUCOSE 69* 64* 74  BUN 42* 38* 41*  CREATININE 2.25* 2.05* 2.15*  CALCIUM 8.9 9.2 8.8*  MG  --   --  2.3    Liver Function Tests: Recent Labs  Lab 03/31/21 1020  AST 32  ALT 15  ALKPHOS 188*  BILITOT 2.1*  PROT 7.0  ALBUMIN 3.2*   No results for input(s): LIPASE, AMYLASE in  the last 168 hours. No results for input(s): AMMONIA in the last 168 hours.  CBC: Recent Labs  Lab 03/31/21 1020 04/01/21 0346 04/02/21 0359  WBC 5.6 5.3 6.2  NEUTROABS 3.4  --   --   HGB 9.0* 9.0* 8.9*  HCT 27.7* 27.5* 26.7*  MCV 81.7 78.6* 78.3*  PLT 129* 122* 123*    Cardiac Enzymes: No results for input(s): CKTOTAL, CKMB, CKMBINDEX, TROPONINI in the last 168 hours.  BNP: BNP (last 3 results) Recent Labs    02/06/21 1300 02/21/21 1431 03/31/21 1018  BNP 1,516.0* 1,988.6* 2,067.0*    ProBNP (last 3 results) No results for input(s): PROBNP in the last 8760 hours.    Other results:  Imaging: DG Chest Port 1 View  Result Date: 04/01/2021 CLINICAL DATA:  Acute heart failure. EXAM: PORTABLE CHEST 1 VIEW COMPARISON:  Chest radiograph dated 08/29/2021. FINDINGS: There is cardiomegaly with vascular congestion and edema. Pneumonia is not excluded clinical correlation is recommended. No pleural effusion pneumothorax. Left pectoral pacemaker device. Aortic valve repair. No acute osseous pathology. IMPRESSION: Cardiomegaly with mild vascular congestion and edema. Pneumonia is not excluded. Electronically Signed   By: Anner Crete M.D.   On: 04/01/2021 02:45     Medications:     Scheduled Medications:  aspirin EC  81 mg Oral Q breakfast   carvedilol  3.125 mg Oral BID  WC   furosemide  80 mg Intravenous BID   heparin  5,000 Units Subcutaneous Q8H   insulin aspart  0-9 Units Subcutaneous TID WC   linaclotide  145 mcg Oral QAC breakfast   metolazone  5 mg Oral Daily   pantoprazole  40 mg Oral Daily   potassium chloride SA  40 mEq Oral BID   sodium chloride flush  3 mL Intravenous Q12H    Infusions:  sodium chloride      PRN Medications: sodium chloride, acetaminophen, loratadine, ondansetron (ZOFRAN) IV, sodium chloride flush, traZODone   Assessment/Plan:   1. Acute on Chronic Systolic Heart Failure/NICM: - Echo 08/2015 EF 50-55% (PPM placed in 08/2015)  -  Echo 3/22 EF 25-30% TAVR ok. Mod MR .-LHC 9/21 that showed no coronary disease.  - based on timing of drop in EF and 42% pacing percentage, concern for RV paced CM +AS.Left subclavian vein occluded -> unable to upgrade to CRT. - s/p TAVR 2/22 - Echo 10/22 EF 30-35% - Now with marked volume overload c/b cardiorenal syndrome. Not responding to intensification of po diuretics - Diuresing well with IV lasix. Volume status looks much better. Weight back to baseline but still appears to have some fluid overload. - Continue IV lasix + TED hose   2. Acute on chronic Stage IIIb CKD due to cardiorenal syndrome  - Followed by nephrology, Dr. Theador Hawthorne.   - SCr 1.4-1.5 in 8/22 - Scr 2.25 on admit -> 2.05 -> 2.15 today - Follow with daily BMET - Volume becoming more difficult to control. May need discussion regarding dialysis sooner rather than later.   3. Mitral Regurgitation  - Moderate by TEE 8/21  - mild to moderate on echo 10/22   4. Aortic Stenosis  - s/p TAVR 3/22. - Stable on echo.    5. Atrial Flutter/ Chronic Atrial Fibrillation - s/p AFL ablation.  - In chronic Afib, rate controlled.  - s/p frequent GI bleed so off Eliquis.  - CHA2DS2-VASc Score = at least 5  - Unable to safely restart Eliquis at this time.  - EP felt not a Watchman candidate.   6. T2DM - On insulin. - Off Farxiga due to yeast infection   7. H/o CHB  - s/p PPM followed by Dr. Lovena Le  - ? RV paced CM. He's pacing < 10%. Had drop in EF 2 months after pacer was placed.  - Following with Dr.Taylor. Attempted CRT upgrade 3/22 but left subclavian vein occluded.   8. UGIB with iron deficiency anemia - EGD in 7/22 with duodenal ulcer with visible vessel. Underwent coiling of GDA.  - s/p EGD 11/22 with no active bleed; received 4 U PRBCs. - No evidence of ongoing bleeding. Hgb 9.0 MCV 78 - check iron stores in am    Length of Stay: 2   Glori Bickers MD 04/02/2021, 3:32 PM  Advanced Heart Failure Team Pager  [phone] (M-F; 7a - 4p)  Please contact [facility] for night-coverage after hours (4p -7a ) and weekends on [email or web address]

## 2021-04-02 NOTE — Plan of Care (Signed)
  Problem: Education: Goal: Knowledge of General Education information will improve Description Including pain rating scale, medication(s)/side effects and non-pharmacologic comfort measures Outcome: Progressing   Problem: Health Behavior/Discharge Planning: Goal: Ability to manage health-related needs will improve Outcome: Progressing   

## 2021-04-02 NOTE — Progress Notes (Signed)
CCMD notified RN that the patient had a 6 beat run of [facility] in between his paced rhythm. RN rounded and assessed patient. Patient is watching television in bed with wife at the bedside, asymptomatic. Patient verbalize no chest pain/pressure and has not moved OOB during the last 62mns. RN clarified with patient and patient's wife to notify nursing staff is any chest pressure or pain presents. Call bell is within reach. Cardiology paged to notify.

## 2021-04-02 NOTE — Progress Notes (Signed)
Progress Note    John [name]  MBW:[id number] DOB: 01-28-1946  DOA: 03/31/2021 PCP: [name], MD    Brief Narrative:     Medical records reviewed and are as summarized below:  John [name] is an 76 y.o. male with medical history significant for atrial flutter, chronic kidney disease, heart failure, severe aortic stenosis status post TAVR, anemia paroxysmal SVT, NICM, hypertension, hyperlipidemia, type 2 diabetes mellitus, insulin requiring, uncontrolled with A1c>9, gout and other comorbidities detailed below presents to the ED with worsening shortness of breath ongoing for the past several days.  He reports he is gained approximately 15 pounds.  He has been taking extra metolazone and torsemide.  Unfortunately he reports that he is not having good diuresis with this therapy.    Assessment/Plan:   Principal Problem:   Acute heart failure (HCC) Active Problems:   DM type 2 causing vascular disease ([place])   Mixed hyperlipidemia   Essential hypertension, benign   Pacemaker   CKD (chronic kidney disease) stage 3, GFR 30-59 ml/min (HCC)   Paroxysmal atrial fibrillation (HCC)   Hyponatremia   Acute on chronic combined systolic (congestive) and diastolic (congestive) heart failure (HCC)   Lower leg edema   NASH (nonalcoholic steatohepatitis)   Thrombocytopenia (HCC)   Hypoalbuminemia   S/P TAVR (transcatheter aortic valve replacement)   Polyarticular gout   SOB (shortness of breath)   GERD (gastroesophageal reflux disease)   Acute HFrEF  - Pt had recent Echo October 2022 with EF 30-35% - he presents now with a 15# weight gain and volume overload - continue IV lasix per cardiology recommendation -Monitor daily weights, intake and output and electrolytes closely -Consult to advanced heart failure team (transferred from [facility] to [facility]) appreciated:    Type 2 diabetes mellitus with vascular complications, uncontrolled, insulin requiring -Uncontrolled as evidenced by  hemoglobin A1c greater than 9% -Continue SSI- hold long acting -Carbohydrate modified diet, no concentrated sweets or fruit juices except to treat a low blood glucose   Atrial flutter/chronic atrial fibrillation -Patient's status post ablation -Heart rate currently controlled -Patient not anticoagulated due to history of frequent GI bleeding   Aortic stenosis s/p TAVR 3/22 -  stable    Anemia in chronic kidney disease  - Hg holding stable around 9.0.    Stage 3b CKD  - stable  -will follow closely with diuresis -follows with [facility]     Family Communication/Anticipated D/C date and plan/Code Status   DVT prophylaxis: heparin Code Status: Full Code.  Family Communication: at bedside Disposition Plan: Status is: Inpatient  Remains inpatient appropriate because: needs further IV diuresis         Medical Consultants:   CHF team     Subjective:   No CP, no SOB  Objective:    Vitals:   04/01/21 1709 04/01/21 1956 04/02/21 0318 04/02/21 0851  BP: (!) 107/50 112/73 (!) 100/51 (!) 105/52  Pulse: 60  64 60  Resp: 18 19 19    Temp: 98 F (36.7 C) 97.8 F (36.6 C) 97.6 F (36.4 C) 98.1 F (36.7 C)  TempSrc: Oral Oral Oral Oral  SpO2: 100% 100% 96% 100%  Weight:   94.9 kg   Height:        Intake/Output Summary (Last 24 hours) at 04/02/2021 0853 Last data filed at 04/02/2021 0851 Gross per 24 hour  Intake 3566 ml  Output 3200 ml  Net 366 ml   Filed Weights   03/31/21 1830 04/01/21  0312 04/02/21 0318  Weight: 98.3 kg 96.3 kg 94.9 kg    Exam:   General: Appearance:     Overweight male in no acute distress     Lungs:     respirations unlabored, diminished due to size  Heart:    Normal heart rate.    MS:   All extremities are intact. Min LE edema   Neurologic:   Awake, alert, oriented x 3. No apparent focal neurological           defect.      Data Reviewed:   I have personally reviewed following labs and imaging studies:  Labs: Labs  show the following:   Basic Metabolic Panel: Recent Labs  Lab 03/31/21 1020 04/01/21 0843 04/02/21 0359  NA 138 138 137  K 3.7 4.1 4.0  CL 102 99 96*  CO2 27 29 29   GLUCOSE 69* 64* 74  BUN 42* 38* 41*  CREATININE 2.25* 2.05* 2.15*  CALCIUM 8.9 9.2 8.8*  MG  --   --  2.3   GFR Estimated Creatinine Clearance: 35.5 mL/min (A) (by C-G formula based on SCr of 2.15 mg/dL (H)). Liver Function Tests: Recent Labs  Lab 03/31/21 1020  AST 32  ALT 15  ALKPHOS 188*  BILITOT 2.1*  PROT 7.0  ALBUMIN 3.2*   No results for input(s): LIPASE, AMYLASE in the last 168 hours. No results for input(s): AMMONIA in the last 168 hours. Coagulation profile No results for input(s): INR, PROTIME in the last 168 hours.  CBC: Recent Labs  Lab 03/31/21 1020 04/01/21 0346 04/02/21 0359  WBC 5.6 5.3 6.2  NEUTROABS 3.4  --   --   HGB 9.0* 9.0* 8.9*  HCT 27.7* 27.5* 26.7*  MCV 81.7 78.6* 78.3*  PLT 129* 122* 123*   Cardiac Enzymes: No results for input(s): CKTOTAL, CKMB, CKMBINDEX, TROPONINI in the last 168 hours. BNP (last 3 results) No results for input(s): PROBNP in the last 8760 hours. CBG: Recent Labs  Lab 04/01/21 1709 04/01/21 2104 04/02/21 0315 04/02/21 0628 04/02/21 0648  GLUCAP 159* 119* 87 65* 86   D-Dimer: No results for input(s): DDIMER in the last 72 hours. Hgb A1c: Recent Labs    03/31/21 1444  HGBA1C 6.2*   Lipid Profile: No results for input(s): CHOL, HDL, LDLCALC, TRIG, CHOLHDL, LDLDIRECT in the last 72 hours. Thyroid function studies: Recent Labs    03/31/21 1020  TSH 3.923   Anemia work up: No results for input(s): VITAMINB12, FOLATE, FERRITIN, TIBC, IRON, RETICCTPCT in the last 72 hours. Sepsis Labs: Recent Labs  Lab 03/31/21 1020 04/01/21 0346 04/02/21 0359  WBC 5.6 5.3 6.2    Microbiology Recent Results (from the past 240 hour(s))  Resp Panel by RT-PCR (Flu A&B, Covid) Nasopharyngeal Swab     Status: None   Collection Time: 03/31/21  9:48  AM   Specimen: Nasopharyngeal Swab; Nasopharyngeal(NP) swabs in vial transport medium  Result Value Ref Range Status   SARS Coronavirus 2 by RT PCR NEGATIVE NEGATIVE Final    Comment: (NOTE) SARS-CoV-2 target nucleic acids are NOT DETECTED.  The SARS-CoV-2 RNA is generally detectable in upper respiratory specimens during the acute phase of infection. The lowest concentration of SARS-CoV-2 viral copies this assay can detect is 138 copies/mL. A negative result does not preclude SARS-Cov-2 infection and should not be used as the sole basis for treatment or other patient management decisions. A negative result may occur with  improper specimen collection/handling, submission of specimen other than  nasopharyngeal swab, presence of viral mutation(s) within the areas targeted by this assay, and inadequate number of viral copies(<138 copies/mL). A negative result must be combined with clinical observations, patient history, and epidemiological information. The expected result is Negative.  Fact Sheet for Patients:  EntrepreneurPulse.com.au  Fact Sheet for Healthcare Providers:  IncredibleEmployment.be  This test is no t yet approved or cleared by the [place] FDA and  has been authorized for detection and/or diagnosis of SARS-CoV-2 by FDA under an Emergency Use Authorization (EUA). This EUA will remain  in effect (meaning this test can be used) for the duration of the COVID-19 declaration under Section 564(b)(1) of the Act, 21 U.S.C.section 360bbb-3(b)(1), unless the authorization is terminated  or revoked sooner.       Influenza A by PCR NEGATIVE NEGATIVE Final   Influenza B by PCR NEGATIVE NEGATIVE Final    Comment: (NOTE) The Xpert Xpress SARS-CoV-2/FLU/RSV plus assay is intended as an aid in the diagnosis of influenza from Nasopharyngeal swab specimens and should not be used as a sole basis for treatment. Nasal washings and aspirates are  unacceptable for Xpert Xpress SARS-CoV-2/FLU/RSV testing.  Fact Sheet for Patients: EntrepreneurPulse.com.au  Fact Sheet for Healthcare Providers: IncredibleEmployment.be  This test is not yet approved or cleared by the [place] FDA and has been authorized for detection and/or diagnosis of SARS-CoV-2 by FDA under an Emergency Use Authorization (EUA). This EUA will remain in effect (meaning this test can be used) for the duration of the COVID-19 declaration under Section 564(b)(1) of the Act, 21 U.S.C. section 360bbb-3(b)(1), unless the authorization is terminated or revoked.  Performed at [facility], [address], [place], [place] [address]     Procedures and diagnostic studies:  DG Chest 2 View  Result Date: 03/31/2021 CLINICAL DATA:  BILATERAL rales, BILATERAL leg swelling, and shortness of breath for 2 weeks, history CHF, former smoker EXAM: CHEST - 2 VIEW COMPARISON:  12/30/2020 FINDINGS: LEFT subclavian pacemaker leads project at RIGHT atrium and RIGHT ventricle. Enlargement of cardiac silhouette with pulmonary vascular congestion post TAVR. Stable mediastinal contours. Accentuation of interstitial markings, better demonstrated on prior exam, likely chronic interstitial lung disease. Minimal atelectasis at posterior lung bases. No definite acute infiltrate, pleural effusion, or pneumothorax. No acute osseous findings. IMPRESSION: Enlargement of cardiac silhouette with pulmonary vascular congestion post pacemaker and TAVR. Chronic interstitial lung disease with minimal bibasilar atelectasis. No acute infiltrate. Electronically Signed   By: Lavonia Dana M.D.   On: 03/31/2021 10:14   DG Chest Port 1 View  Result Date: 04/01/2021 CLINICAL DATA:  Acute heart failure. EXAM: PORTABLE CHEST 1 VIEW COMPARISON:  Chest radiograph dated 08/29/2021. FINDINGS: There is cardiomegaly with vascular congestion and edema. Pneumonia is not excluded clinical  correlation is recommended. No pleural effusion pneumothorax. Left pectoral pacemaker device. Aortic valve repair. No acute osseous pathology. IMPRESSION: Cardiomegaly with mild vascular congestion and edema. Pneumonia is not excluded. Electronically Signed   By: Anner Crete M.D.   On: 04/01/2021 02:45    Medications:    aspirin EC  81 mg Oral Q breakfast   carvedilol  3.125 mg Oral BID WC   furosemide  80 mg Intravenous BID   heparin  5,000 Units Subcutaneous Q8H   insulin aspart  0-9 Units Subcutaneous TID WC   insulin glargine-yfgn  5 Units Subcutaneous QHS   linaclotide  145 mcg Oral QAC breakfast   metolazone  5 mg Oral Daily   pantoprazole  40 mg Oral Daily  potassium chloride SA  40 mEq Oral BID   sodium chloride flush  3 mL Intravenous Q12H   Continuous Infusions:  sodium chloride       LOS: 2 days   Geradine Girt  [facility]   How to contact the [facility] Attending or Consulting provider [place] or covering provider during after hours [place], for this patient?  Check the care team in [facility] and look for a) attending/consulting [facility] provider listed and b) the [facility] team listed Log into [email or web address] and use [place]'s universal password to access. If you do not have the password, please contact the hospital operator. Locate the [facility] provider you are looking for under [facility] and page to a number that you can be directly reached. If you still have difficulty reaching the provider, please page the [facility] (Director on Call) for the Hospitalists listed on amion for assistance.  04/02/2021, 8:53 AM

## 2021-04-03 DIAGNOSIS — I5021 Acute systolic (congestive) heart failure: Secondary | ICD-10-CM | POA: Diagnosis not present

## 2021-04-03 DIAGNOSIS — E1159 Type 2 diabetes mellitus with other circulatory complications: Secondary | ICD-10-CM

## 2021-04-03 DIAGNOSIS — N1832 Chronic kidney disease, stage 3b: Secondary | ICD-10-CM | POA: Diagnosis not present

## 2021-04-03 LAB — BASIC METABOLIC PANEL
Anion gap: 12 (ref 5–15)
BUN: 46 mg/dL — ABNORMAL HIGH (ref 8–23)
CO2: 28 mmol/L (ref 22–32)
Calcium: 8.6 mg/dL — ABNORMAL LOW (ref 8.9–10.3)
Chloride: 96 mmol/L — ABNORMAL LOW (ref 98–111)
Creatinine, Ser: 2.23 mg/dL — ABNORMAL HIGH (ref 0.61–1.24)
GFR, Estimated: 30 mL/min — ABNORMAL LOW (ref >60)
Glucose, Bld: 164 mg/dL — ABNORMAL HIGH (ref 70–99)
Potassium: 4 mmol/L (ref 3.5–5.1)
Sodium: 136 mmol/L (ref 135–145)

## 2021-04-03 LAB — CBC
HCT: 27.5 % — ABNORMAL LOW (ref 39.0–52.0)
Hemoglobin: 8.8 g/dL — ABNORMAL LOW (ref 13.0–17.0)
MCH: 25.3 pg — ABNORMAL LOW (ref 26.0–34.0)
MCHC: 32 g/dL (ref 30.0–36.0)
MCV: 79 fL — ABNORMAL LOW (ref 80.0–100.0)
Platelets: 110 10*3/uL — ABNORMAL LOW (ref 150–400)
RBC: 3.48 MIL/uL — ABNORMAL LOW (ref 4.22–5.81)
RDW: 22.8 % — ABNORMAL HIGH (ref 11.5–15.5)
WBC: 5.3 10*3/uL (ref 4.0–10.5)
nRBC: 0 % (ref 0.0–0.2)

## 2021-04-03 LAB — IRON AND TIBC
Iron: 39 ug/dL — ABNORMAL LOW (ref 45–182)
Saturation Ratios: 9 % — ABNORMAL LOW (ref 17.9–39.5)
TIBC: 448 ug/dL (ref 250–450)
UIBC: 409 ug/dL

## 2021-04-03 LAB — GLUCOSE, CAPILLARY
Glucose-Capillary: 163 mg/dL — ABNORMAL HIGH (ref 70–99)
Glucose-Capillary: 141 mg/dL — ABNORMAL HIGH (ref 70–99)
Glucose-Capillary: 238 mg/dL — ABNORMAL HIGH (ref 70–99)
Glucose-Capillary: 183 mg/dL — ABNORMAL HIGH (ref 70–99)
Glucose-Capillary: 137 mg/dL — ABNORMAL HIGH (ref 70–99)

## 2021-04-03 LAB — MAGNESIUM: Magnesium: 2.4 mg/dL (ref 1.7–2.4)

## 2021-04-03 LAB — FERRITIN: Ferritin: 48 ng/mL (ref 24–336)

## 2021-04-03 MED ORDER — SODIUM CHLORIDE 0.9 % IV SOLN
510.0000 mg | Freq: Once | INTRAVENOUS | Status: AC
  Administered 2021-04-03: 510 mg via INTRAVENOUS
  Filled 2021-04-03: qty 17

## 2021-04-03 MED ORDER — POTASSIUM CHLORIDE CRYS ER 20 MEQ PO TBCR
40.0000 meq | EXTENDED_RELEASE_TABLET | Freq: Every day | ORAL | Status: DC
  Administered 2021-04-03 – 2021-04-04 (×2): 40 meq via ORAL
  Filled 2021-04-03 (×2): qty 2

## 2021-04-03 MED ORDER — TORSEMIDE 20 MG PO TABS
80.0000 mg | ORAL_TABLET | Freq: Two times a day (BID) | ORAL | Status: DC
  Administered 2021-04-04: 80 mg via ORAL
  Filled 2021-04-03 (×2): qty 4

## 2021-04-03 NOTE — Progress Notes (Signed)
Progress Note    John [name]  BUL:[id number] DOB: October 31, 1945  DOA: 03/31/2021 PCP: [name], MD    Brief Narrative:     Medical records reviewed and are as summarized below:  John [name] is an 76 y.o. male with medical history significant for atrial flutter, chronic kidney disease, heart failure, severe aortic stenosis status post TAVR, anemia paroxysmal SVT, NICM, hypertension, hyperlipidemia, type 2 diabetes mellitus, insulin requiring, uncontrolled with A1c>9, gout and other comorbidities detailed below presents to the ED with worsening shortness of breath ongoing for the past several days.  He reports he is gained approximately 15 pounds.  He has been taking extra metolazone and torsemide.  Unfortunately he reports that he is not having good diuresis with this therapy.   04/03/2021: Patient was seen and examined at his bedside.  Still volume overload on exam.  He denies any chest pain or shortness of breath at rest.  Ongoing diuresing with the assistance of heart failure team. Net I&O -3.8 L since admission.   Assessment/Plan:   Principal Problem:   Acute heart failure (HCC) Active Problems:   DM type 2 causing vascular disease ([place])   Mixed hyperlipidemia   Essential hypertension, benign   Pacemaker   CKD (chronic kidney disease) stage 3, GFR 30-59 ml/min (HCC)   Paroxysmal atrial fibrillation (HCC)   Hyponatremia   Acute on chronic combined systolic (congestive) and diastolic (congestive) heart failure (HCC)   Lower leg edema   NASH (nonalcoholic steatohepatitis)   Thrombocytopenia (HCC)   Hypoalbuminemia   S/P TAVR (transcatheter aortic valve replacement)   Polyarticular gout   SOB (shortness of breath)   GERD (gastroesophageal reflux disease)   Acute HFrEF  - Pt had recent Echo October 2022 with EF 30-35% - he presents now with a 15# weight gain and volume overload - continue IVlasix per cardiology recommendation -Monitor daily weights, intake and  output and electrolytes closely -Consult to advanced heart failure team (transferred [facility] to [facility]) appreciated:  Ongoing diuresing, currently on IV Lasix 80 mg twice daily. Continue to closely monitor renal function, urine output and electrolytes. Replete electrolytes as indicated Net I&O -3.8 L since admission Management per heart failure team.   Type 2 diabetes mellitus with hyperglycemia Hemoglobin A1c 6.2 on 03/31/2021. Continue insulin sliding scale Avoid hypoglycemia.   Atrial flutter/chronic atrial fibrillation -Patient's status post ablation -Heart rate currently controlled, rhythm is irregular. -Patient not anticoagulated due to history of frequent GI bleeding   Aortic stenosis s/p TAVR 3/22 -  stable    Anemia in chronic kidney disease superimposed on iron deficiency anemia. -Baseline hemoglobin 9.0. Hemoglobin 8.8, MCV 79. Iron studies suggestive of iron deficiency. Replete iron intravenously No overt bleeding Continue to monitor H&H   Stage 3b CKD  Appears to be at his baseline creatinine 2.3 with GFR of 30. Continue to closely monitor urine output Continue to avoid nephrotoxic agents and hypotension. Continue to monitor renal panel.     Family Communication/Anticipated D/C date and plan/Code Status   DVT prophylaxis: heparin SQ 3 times daily Code Status: Full Code.  Family Communication: at bedside Disposition Plan: Status is: Inpatient  Remains inpatient appropriate because: needs further IV diuresis         Medical Consultants:   CHF team    Objective:    Vitals:   04/02/21 1952 04/03/21 0106 04/03/21 0420 04/03/21 0943  BP: 104/65  (!) 108/49 103/60  Pulse: 60  73 60  Resp:  16   Temp: 98.4 F (36.9 C)  98.4 F (36.9 C)   TempSrc: Oral  Oral   SpO2: 99%  98%   Weight:  93.4 kg    Height:        Intake/Output Summary (Last 24 hours) at 04/03/2021 1338 Last data filed at 04/03/2021 1311 Gross per 24 hour  Intake 1020 ml   Output 2025 ml  Net -1005 ml   Filed Weights   04/01/21 0312 04/02/21 0318 04/03/21 0106  Weight: 96.3 kg 94.9 kg 93.4 kg    Exam:   General: Appearance:  Well-developed well-nourished no acute distress.  He is alert and oriented x3. Overweight male in no acute distress     Lungs:   Clear to auscultation with no wheezes or rales.  Good inspiratory effort.  Heart:    Irregular rate and rhythm no rubs or gallops.  No JVD or thyromegaly noted.   MS: 3+ pitting edema lower extremities bilaterally.   Abdomen: Obese, bowel sounds present.  Nontender.  Neurologic: Awake and alert.  Moves all 4 extremities. Psych: Mood is appropriate for condition and setting.     Data Reviewed:   I have personally reviewed following labs and imaging studies:  Labs: Labs show the following:   Basic Metabolic Panel: Recent Labs  Lab 03/31/21 1020 04/01/21 0346 04/01/21 0843 04/02/21 0359 04/03/21 0409  NA 138 135 138 137 136  K 3.7 3.3* 4.1 4.0 4.0  CL 102 98 99 96* 96*  CO2 27 29 29 29 28   GLUCOSE 69* 110* 64* 74 164*  BUN 42* 37* 38* 41* 46*  CREATININE 2.25* 1.99* 2.05* 2.15* 2.23*  CALCIUM 8.9 9.1 9.2 8.8* 8.6*  MG  --  2.6*  --  2.3 2.4   GFR Estimated Creatinine Clearance: 34 mL/min (A) (by C-G formula based on SCr of 2.23 mg/dL (H)). Liver Function Tests: Recent Labs  Lab 03/31/21 1020  AST 32  ALT 15  ALKPHOS 188*  BILITOT 2.1*  PROT 7.0  ALBUMIN 3.2*   No results for input(s): LIPASE, AMYLASE in the last 168 hours. No results for input(s): AMMONIA in the last 168 hours. Coagulation profile No results for input(s): INR, PROTIME in the last 168 hours.  CBC: Recent Labs  Lab 03/31/21 1020 04/01/21 0346 04/02/21 0359 04/03/21 0409  WBC 5.6 5.3 6.2 5.3  NEUTROABS 3.4  --   --   --   HGB 9.0* 9.0* 8.9* 8.8*  HCT 27.7* 27.5* 26.7* 27.5*  MCV 81.7 78.6* 78.3* 79.0*  PLT 129* 122* 123* 110*   Cardiac Enzymes: No results for input(s): CKTOTAL, CKMB, CKMBINDEX,  TROPONINI in the last 168 hours. BNP (last 3 results) No results for input(s): PROBNP in the last 8760 hours. CBG: Recent Labs  Lab 04/02/21 1626 04/02/21 2055 04/03/21 0356 04/03/21 0602 04/03/21 1151  GLUCAP 127* 166* 163* 141* 238*   D-Dimer: No results for input(s): DDIMER in the last 72 hours. Hgb A1c: Recent Labs    03/31/21 1444  HGBA1C 6.2*   Lipid Profile: No results for input(s): CHOL, HDL, LDLCALC, TRIG, CHOLHDL, LDLDIRECT in the last 72 hours. Thyroid function studies: No results for input(s): TSH, T4TOTAL, T3FREE, THYROIDAB in the last 72 hours.  Invalid input(s): FREET3  Anemia work up: Recent Labs    04/03/21 0409  FERRITIN 48  TIBC 448  IRON 39*   Sepsis Labs: Recent Labs  Lab 03/31/21 1020 04/01/21 0346 04/02/21 0359 04/03/21 0409  WBC 5.6 5.3 6.2  5.3    Microbiology Recent Results (from the past 240 hour(s))  Resp Panel by RT-PCR (Flu A&B, Covid) Nasopharyngeal Swab     Status: None   Collection Time: 03/31/21  9:48 AM   Specimen: Nasopharyngeal Swab; Nasopharyngeal(NP) swabs in vial transport medium  Result Value Ref Range Status   SARS Coronavirus 2 by RT PCR NEGATIVE NEGATIVE Final    Comment: (NOTE) SARS-CoV-2 target nucleic acids are NOT DETECTED.  The SARS-CoV-2 RNA is generally detectable in upper respiratory specimens during the acute phase of infection. The lowest concentration of SARS-CoV-2 viral copies this assay can detect is 138 copies/mL. A negative result does not preclude SARS-Cov-2 infection and should not be used as the sole basis for treatment or other patient management decisions. A negative result may occur with  improper specimen collection/handling, submission of specimen other than nasopharyngeal swab, presence of viral mutation(s) within the areas targeted by this assay, and inadequate number of viral copies(<138 copies/mL). A negative result must be combined with clinical observations, patient history, and  epidemiological information. The expected result is Negative.  Fact Sheet for Patients:  EntrepreneurPulse.com.au  Fact Sheet for Healthcare Providers:  IncredibleEmployment.be  This test is no t yet approved or cleared by the [place] FDA and  has been authorized for detection and/or diagnosis of SARS-CoV-2 by FDA under an Emergency Use Authorization (EUA). This EUA will remain  in effect (meaning this test can be used) for the duration of the COVID-19 declaration under Section 564(b)(1) of the Act, 21 U.S.C.section 360bbb-3(b)(1), unless the authorization is terminated  or revoked sooner.       Influenza A by PCR NEGATIVE NEGATIVE Final   Influenza B by PCR NEGATIVE NEGATIVE Final    Comment: (NOTE) The Xpert Xpress SARS-CoV-2/FLU/RSV plus assay is intended as an aid in the diagnosis of influenza from Nasopharyngeal swab specimens and should not be used as a sole basis for treatment. Nasal washings and aspirates are unacceptable for Xpert Xpress SARS-CoV-2/FLU/RSV testing.  Fact Sheet for Patients: EntrepreneurPulse.com.au  Fact Sheet for Healthcare Providers: IncredibleEmployment.be  This test is not yet approved or cleared by the [place] FDA and has been authorized for detection and/or diagnosis of SARS-CoV-2 by FDA under an Emergency Use Authorization (EUA). This EUA will remain in effect (meaning this test can be used) for the duration of the COVID-19 declaration under Section 564(b)(1) of the Act, 21 U.S.C. section 360bbb-3(b)(1), unless the authorization is terminated or revoked.  Performed at [facility], [address], [place], [place] [address]     Procedures and diagnostic studies:  No results found.  Medications:    aspirin EC  81 mg Oral Q breakfast   carvedilol  3.125 mg Oral BID WC   furosemide  80 mg Intravenous BID   heparin  5,000 Units Subcutaneous Q8H    insulin aspart  0-9 Units Subcutaneous TID WC   linaclotide  145 mcg Oral QAC breakfast   pantoprazole  40 mg Oral Daily   potassium chloride SA  40 mEq Oral Daily   sodium chloride flush  3 mL Intravenous Q12H   [START ON 04/04/2021] torsemide  80 mg Oral BID   Continuous Infusions:  sodium chloride       LOS: 3 days   Kayleen Memos  [facility]   How to contact the [facility] Attending or Consulting provider [place] or covering provider during after hours [place], for this patient?  Check the care team in [facility]  and look for a) attending/consulting [place] provider listed and b) the [facility] team listed Log into [email or web address] and use [place]'s universal password to access. If you do not have the password, please contact the hospital operator. Locate the [facility] provider you are looking for under [facility] and page to a number that you can be directly reached. If you still have difficulty reaching the provider, please page the [facility] (Director on Call) for the Hospitalists listed on amion for assistance.  04/03/2021, 1:38 PM

## 2021-04-03 NOTE — Progress Notes (Addendum)
Advanced Heart Failure Rounding Note   Subjective:   Admit weight 221.8 pounds --->206 pounds  Diuresing with IV lasix + metolazone.   Scr 2.25 -> 2.05 -> 2.15>>2.23   Feels much better. Denies SOB>   Objective:   Weight Range:  Vital Signs:   Temp:  [97.9 F (36.6 C)-98.4 F (36.9 C)] 98.4 F (36.9 C) (01/09 0420) Pulse Rate:  [60-73] 73 (01/09 0420) Resp:  [16] 16 (01/09 0420) BP: (104-118)/(49-65) 108/49 (01/09 0420) SpO2:  [98 %-100 %] 98 % (01/09 0420) Weight:  [93.4 kg] 93.4 kg (01/09 0106) Last BM Date: 04/01/21  Weight change: Filed Weights   04/01/21 0312 04/02/21 0318 04/03/21 0106  Weight: 96.3 kg 94.9 kg 93.4 kg    Intake/Output:   Intake/Output Summary (Last 24 hours) at 04/03/2021 0813 Last data filed at 04/03/2021 0500 Gross per 24 hour  Intake 660 ml  Output 2550 ml  Net -1890 ml     Physical Exam: General:  Well appearing. No resp difficulty HEENT: normal Neck: supple. JVP 7-8 . Carotids 2+ bilat; no bruits. No lymphadenopathy or thryomegaly appreciated. Cor: PMI nondisplaced. Regular rate & rhythm. No rubs, gallops or murmurs. Lungs: clear Abdomen: soft, nontender, nondistended. No hepatosplenomegaly. No bruits or masses. Good bowel sounds. Extremities: no cyanosis, clubbing, rash, R and LLE ted hose. No edema Neuro: alert & orientedx3, cranial nerves grossly intact. moves all 4 extremities w/o difficulty. Affect pleasant  Telemetry:  AF V paced 60s   Labs: Basic Metabolic Panel: Recent Labs  Lab 03/31/21 1020 04/01/21 0346 04/01/21 0843 04/02/21 0359 04/03/21 0409  NA 138 135 138 137 136  K 3.7 3.3* 4.1 4.0 4.0  CL 102 98 99 96* 96*  CO2 27 29 29 29 28   GLUCOSE 69* 110* 64* 74 164*  BUN 42* 37* 38* 41* 46*  CREATININE 2.25* 1.99* 2.05* 2.15* 2.23*  CALCIUM 8.9 9.1 9.2 8.8* 8.6*  MG  --  2.6*  --  2.3 2.4    Liver Function Tests: Recent Labs  Lab 03/31/21 1020  AST 32  ALT 15  ALKPHOS 188*  BILITOT 2.1*  PROT 7.0   ALBUMIN 3.2*   No results for input(s): LIPASE, AMYLASE in the last 168 hours. No results for input(s): AMMONIA in the last 168 hours.  CBC: Recent Labs  Lab 03/31/21 1020 04/01/21 0346 04/02/21 0359 04/03/21 0409  WBC 5.6 5.3 6.2 5.3  NEUTROABS 3.4  --   --   --   HGB 9.0* 9.0* 8.9* 8.8*  HCT 27.7* 27.5* 26.7* 27.5*  MCV 81.7 78.6* 78.3* 79.0*  PLT 129* 122* 123* 110*    Cardiac Enzymes: No results for input(s): CKTOTAL, CKMB, CKMBINDEX, TROPONINI in the last 168 hours.  BNP: BNP (last 3 results) Recent Labs    02/21/21 1431 03/31/21 1018 04/01/21 0346  BNP 1,988.6* 2,067.0* 2,163.5*    ProBNP (last 3 results) No results for input(s): PROBNP in the last 8760 hours.    Other results:  Imaging: No results found.   Medications:     Scheduled Medications:  aspirin EC  81 mg Oral Q breakfast   carvedilol  3.125 mg Oral BID WC   furosemide  80 mg Intravenous BID   heparin  5,000 Units Subcutaneous Q8H   insulin aspart  0-9 Units Subcutaneous TID WC   linaclotide  145 mcg Oral QAC breakfast   metolazone  5 mg Oral Daily   pantoprazole  40 mg Oral Daily   potassium  chloride SA  40 mEq Oral BID   sodium chloride flush  3 mL Intravenous Q12H    Infusions:  sodium chloride      PRN Medications: sodium chloride, acetaminophen, loratadine, ondansetron (ZOFRAN) IV, sodium chloride flush, traZODone   Assessment/Plan:   1. Acute on Chronic Systolic Heart Failure/NICM: - Echo 08/2015 EF 50-55% (PPM placed in 08/2015)  - Echo 3/22 EF 25-30% TAVR ok. Mod MR .-LHC 9/21 that showed no coronary disease.  - based on timing of drop in EF and 42% pacing percentage, concern for RV paced CM +AS.Left subclavian vein occluded -> unable to upgrade to CRT. - s/p TAVR 2/22 - Echo 10/22 EF 30-35% - Admitted with marked volume overload c/b cardiorenal syndrome. Not responding to intensification of po diuretics - Volume status much improved.  - GIve one more dose of IV  lasix then restart torsemide. Stop metolazone. Start torsemide 80 mg twice a day. Will need Metolazone 2 times a week.    2. Acute on chronic Stage IIIb CKD due to cardiorenal syndrome  - Followed by nephrology, Dr. Theador Hawthorne.   - SCr 1.4-1.5 in 8/22 - Scr 2.25 on admit -> 2.05 -> 2.15>2.2 today - Volume becoming more difficult to control. May need discussion regarding dialysis sooner rather than later.   3. Mitral Regurgitation  - Moderate by TEE 8/21  - mild to moderate on echo 10/22   4. Aortic Stenosis  - s/p TAVR 3/22. - Stable on echo.    5. Atrial Flutter/ Chronic Atrial Fibrillation - s/p AFL ablation.  - Remains in chronic A fib. Rate controlled. - s/p frequent GI bleed so off Eliquis.  - CHA2DS2-VASc Score = at least 5  - Unable to safely restart Eliquis at this time.  - EP felt not a Watchman candidate.   6. T2DM - On insulin. - Off Farxiga due to yeast infection   7. H/o CHB  - s/p PPM followed by Dr. Lovena Le  - ? RV paced CM. He's pacing < 10%. Had drop in EF 2 months after pacer was placed.  - Following with Dr.Taylor. Attempted CRT upgrade 3/22 but left subclavian vein occluded.   8. UGIB with iron deficiency anemia - EGD in 7/22 with duodenal ulcer with visible vessel. Underwent coiling of GDA.  - s/p EGD 11/22 with no active bleed; received 4 U PRBCs. - No evidence of ongoing bleeding. Hgb 9.0 MCV 78 - Iron 39. Iron sats 9%. Will give feraheme.    Length of Stay: 3   Amy Clegg NP-C  04/03/2021, 8:13 AM  Advanced Heart Failure Team Pager [phone] (M-F; 7a - 4p)  Please contact [facility] for night-coverage after hours (4p -7a ) and weekends on [email or web address]   Patient seen and examined with the above-signed Advanced Practice Provider and/or Housestaff. I personally reviewed laboratory data, imaging studies and relevant notes. I independently examined the patient and formulated the important aspects of the plan. I have edited the note to reflect any of my  changes or salient points. I have personally discussed the plan with the patient and/or family.  Volume status much improved. Breathing better. No orthopnea or PND. Scr bumped slightly.   General:  Well appearing. No resp difficulty HEENT: normal Neck: supple. no JVD. Carotids 2+ bilat; no bruits. No lymphadenopathy or thryomegaly appreciated. Cor: PMI nondisplaced. Regular rate & rhythm. No rubs, gallops or murmurs. Lungs: clear Abdomen: soft, nontender, nondistended. No hepatosplenomegaly. No bruits or masses. Good bowel sounds. Extremities: no cyanosis,  clubbing, rash, edema Neuro: alert & orientedx3, cranial nerves grossly intact. moves all 4 extremities w/o difficulty. Affect pleasant  He is fully diuresed. Ok to d/c home from my standpoint after Feraheme infusion. Would increase torsemide to 80 bid and increase metolazone to 2.37m Mon and Friday with close monitoring of Scr and electrolytes.   Consider outpatient Cardiomems placement to assist with volume management.   DGlori Bickers MD  10:29 AM

## 2021-04-03 NOTE — Plan of Care (Signed)
  Problem: Education: Goal: Knowledge of General Education information will improve Description: Including pain rating scale, medication(s)/side effects and non-pharmacologic comfort measures Outcome: Progressing   Problem: Health Behavior/Discharge Planning: Goal: Ability to manage health-related needs will improve Outcome: Progressing   Problem: Clinical Measurements: Goal: Will remain free from infection Outcome: Progressing   

## 2021-04-03 NOTE — Progress Notes (Signed)
Discussed possible cardiomems.   He signed consent for Cardiomems and was given information. Staff will show him Cardiomems Video.   He is not on asa or anticoagulants due to frequent GI bleeds. This may preclude Cardiomems implant.   Lizzette Carbonell NP-C  11:32 AM

## 2021-04-03 NOTE — TOC Initial Note (Signed)
Transition of Care [facility]) - Initial/Assessment Note    Patient Details  Name: John [name] MRN: [id number] Date of Birth: Jun 04, 1945  Transition of Care [facility]) CM/SW Contact:    Erenest Rasher, RN Phone Number: [phone] 04/03/2021, 3:42 PM  Clinical Narrative:                  HF TOC CM spoke to pt and wife at bedside. Pt active with [facility]. TOC CM notified [facility]. Will need HH RN and Physical Therapy orders with Face to Face. Has walker, cane and scale at home. Does daily weights. Will continue to follow for dc needs.     Expected Discharge Plan: [place] Barriers to Discharge: Continued Medical Work up   Patient Goals and CMS Choice Patient states their goals for this hospitalization and ongoing recovery are:: want to feel better CMS Medicare.gov Compare Post Acute Care list provided to:: Patient Choice offered to / list presented to : Patient  Expected Discharge Plan and Services Expected Discharge Plan: [place]   Discharge Planning Services: CM Consult Post Acute Care Choice: [place] arrangements for the past 2 months: Single Family Home                           HH Arranged: PT [place]: [place] Date [place]: 04/03/21 Time HH Agency Contacted: 0940 Representative spoke with at [place]: Adela Lank  Prior Living Arrangements/Services Living arrangements for the past 2 months: [place] Lives with:: Spouse Patient language and need for interpreter reviewed:: Yes Do you feel safe going back to the place where you live?: Yes        Care giver support system in place?: No (comment) Current home services: DME, Home PT (rolling walker, cane, Home Health Physical therapy) Criminal Activity/Legal Involvement Pertinent to Current Situation/Hospitalization: No - Comment as needed  Activities of Daily Living Home Assistive Devices/Equipment: CBG Meter, Cane  (specify quad or straight), Walker (specify type) ADL Screening (condition at time of admission) Patient's cognitive ability adequate to safely complete daily activities?: Yes Is the patient deaf or have difficulty hearing?: No Does the patient have difficulty seeing, even when wearing glasses/contacts?: No Does the patient have difficulty concentrating, remembering, or making decisions?: No Patient able to express need for assistance with ADLs?: Yes Does the patient have difficulty dressing or bathing?: No Independently performs ADLs?: Yes (appropriate for developmental age) Does the patient have difficulty walking or climbing stairs?: No Weakness of Legs: None Weakness of Arms/Hands: None  Permission Sought/Granted Permission sought to share information with : Case Manager, Family Supports, PCP Permission granted to share information with : Yes, Verbal Permission Granted  Share Information with NAME: Jd Mccaster  Permission granted to share info w AGENCY: [place] granted to share info w Relationship: wife  Permission granted to share info w Contact Information: [phone]  Emotional Assessment Appearance:: Appears stated age Attitude/Demeanor/Rapport: Gracious Affect (typically observed): Accepting Orientation: : Oriented to Self, Oriented to Place, Oriented to  Time, Oriented to Situation   Psych Involvement: No (comment)  Admission diagnosis:  Acute heart failure ([place]) [I50.9] Acute on chronic systolic congestive heart failure ([place]) [I50.23] Patient Active Problem List   Diagnosis Date Noted   Acute heart failure ([place]) 03/31/2021   Acute GI bleeding 02/06/2021   Melena 01/31/2021   Cirrhosis ([place]) 01/31/2021  Diverticulitis of colon 01/31/2021   Acute diverticulitis 01/26/2021   Acute on chronic systolic (congestive) heart failure ([place]) 12/31/2020   SOB (shortness of breath) 12/31/2020   GERD (gastroesophageal reflux disease) 12/31/2020   History of  anemia due to chronic kidney disease 12/31/2020   Duodenal ulcer 11/22/2020   Iron deficiency anemia due to chronic blood loss 11/22/2020   Itching 11/22/2020   Anemia    Hematoma, chest wall 05/15/2020   Polyarticular gout 05/15/2020   S/P TAVR (transcatheter aortic valve replacement) 05/03/2020   Severe aortic stenosis 04/25/2020   Chronic atrial fibrillation ([place]) 04/25/2020   Acute on chronic systolic CHF (congestive heart failure) ([place]) 04/25/2020   Permanent atrial fibrillation ([place]) 03/10/2020   Calculus of gallbladder without cholecystitis without obstruction    Thrombocytopenia ([place]) 03/09/2020   Hypoalbuminemia 03/09/2020   NASH (nonalcoholic steatohepatitis) 03/03/2020   Lower leg edema 10/14/2019   Class 2 obesity    Acute on chronic combined systolic (congestive) and diastolic (congestive) heart failure ([place]) 11/12/2018   Hyponatremia 11/11/2018   CKD (chronic kidney disease) stage 3, GFR 30-59 ml/min (HCC)    Paroxysmal atrial fibrillation ([place])    Pacemaker 03/02/2018   Atrial pacemaker lead displacement 12/07/2015   NSVT (nonsustained ventricular tachycardia) 08/28/2015   Bradycardia 08/25/2015   Current use of long term anticoagulation 05/04/2011   Atrial flutter ([place]) 01/05/2011   DM type 2 causing vascular disease ([place]) 12/23/2009   Mixed hyperlipidemia 12/23/2009   Essential hypertension, benign 12/23/2009   PCP:  [name], MD Pharmacy:   [id number] [place] [id number]: [phone]: [phone] [id number]: [phone]: [phone]    Social Determinants of Health (SDOH) Interventions    Readmission Risk Interventions Readmission Risk Prevention Plan 02/09/2021 10/18/2020 11/14/2018  Transportation Screening Complete Complete Complete  PCP or  Specialist Appt within 3-5 Days - - Complete  HRI or [place]- - Complete  Social Work Consult for [place]/Counseling - - Complete  Palliative Care Screening - - Not Applicable  Medication Review (Press photographer Complete Complete Complete  HRI or Home Care Consult Complete Complete -  SW Recovery Care/Counseling Consult Complete Complete -  Palliative Care Screening Not Applicable Not Applicable -  [place] Applic[place] Not Applicable -  Some recent data might be hidden

## 2021-04-04 DIAGNOSIS — K3189 Other diseases of stomach and duodenum: Secondary | ICD-10-CM | POA: Diagnosis not present

## 2021-04-04 DIAGNOSIS — E1165 Type 2 diabetes mellitus with hyperglycemia: Secondary | ICD-10-CM | POA: Diagnosis not present

## 2021-04-04 DIAGNOSIS — D62 Acute posthemorrhagic anemia: Secondary | ICD-10-CM | POA: Diagnosis not present

## 2021-04-04 DIAGNOSIS — I5021 Acute systolic (congestive) heart failure: Secondary | ICD-10-CM | POA: Diagnosis not present

## 2021-04-04 DIAGNOSIS — E1122 Type 2 diabetes mellitus with diabetic chronic kidney disease: Secondary | ICD-10-CM | POA: Diagnosis not present

## 2021-04-04 DIAGNOSIS — I130 Hypertensive heart and chronic kidney disease with heart failure and stage 1 through stage 4 chronic kidney disease, or unspecified chronic kidney disease: Secondary | ICD-10-CM | POA: Diagnosis not present

## 2021-04-04 DIAGNOSIS — K7581 Nonalcoholic steatohepatitis (NASH): Secondary | ICD-10-CM | POA: Diagnosis not present

## 2021-04-04 DIAGNOSIS — I5023 Acute on chronic systolic (congestive) heart failure: Secondary | ICD-10-CM | POA: Diagnosis not present

## 2021-04-04 DIAGNOSIS — K5732 Diverticulitis of large intestine without perforation or abscess without bleeding: Secondary | ICD-10-CM | POA: Diagnosis not present

## 2021-04-04 DIAGNOSIS — K222 Esophageal obstruction: Secondary | ICD-10-CM | POA: Diagnosis not present

## 2021-04-04 DIAGNOSIS — I5043 Acute on chronic combined systolic (congestive) and diastolic (congestive) heart failure: Secondary | ICD-10-CM | POA: Diagnosis not present

## 2021-04-04 LAB — GLUCOSE, CAPILLARY
Glucose-Capillary: 106 mg/dL — ABNORMAL HIGH (ref 70–99)
Glucose-Capillary: 116 mg/dL — ABNORMAL HIGH (ref 70–99)

## 2021-04-04 LAB — BASIC METABOLIC PANEL
Anion gap: 11 (ref 5–15)
BUN: 48 mg/dL — ABNORMAL HIGH (ref 8–23)
CO2: 26 mmol/L (ref 22–32)
Calcium: 8.2 mg/dL — ABNORMAL LOW (ref 8.9–10.3)
Chloride: 97 mmol/L — ABNORMAL LOW (ref 98–111)
Creatinine, Ser: 2.24 mg/dL — ABNORMAL HIGH (ref 0.61–1.24)
GFR, Estimated: 30 mL/min — ABNORMAL LOW (ref >60)
Glucose, Bld: 112 mg/dL — ABNORMAL HIGH (ref 70–99)
Potassium: 3.2 mmol/L — ABNORMAL LOW (ref 3.5–5.1)
Sodium: 134 mmol/L — ABNORMAL LOW (ref 135–145)

## 2021-04-04 LAB — CBC
HCT: 27.2 % — ABNORMAL LOW (ref 39.0–52.0)
Hemoglobin: 8.8 g/dL — ABNORMAL LOW (ref 13.0–17.0)
MCH: 25.3 pg — ABNORMAL LOW (ref 26.0–34.0)
MCHC: 32.4 g/dL (ref 30.0–36.0)
MCV: 78.2 fL — ABNORMAL LOW (ref 80.0–100.0)
Platelets: 113 10*3/uL — ABNORMAL LOW (ref 150–400)
RBC: 3.48 MIL/uL — ABNORMAL LOW (ref 4.22–5.81)
RDW: 23 % — ABNORMAL HIGH (ref 11.5–15.5)
WBC: 5.5 10*3/uL (ref 4.0–10.5)
nRBC: 0 % (ref 0.0–0.2)

## 2021-04-04 MED ORDER — CARVEDILOL 3.125 MG PO TABS: 3.1250 mg | ORAL_TABLET | Freq: Two times a day (BID) | ORAL | 0 refills | Status: AC

## 2021-04-04 MED ORDER — POTASSIUM CHLORIDE CRYS ER 20 MEQ PO TBCR: 40.0000 meq | EXTENDED_RELEASE_TABLET | Freq: Every day | ORAL | 0 refills | Status: DC

## 2021-04-04 MED ORDER — METOLAZONE 2.5 MG PO TABS: 2.5000 mg | ORAL_TABLET | ORAL | 0 refills | Status: DC

## 2021-04-04 MED ORDER — TORSEMIDE 40 MG PO TABS: 80.0000 mg | ORAL_TABLET | Freq: Two times a day (BID) | ORAL | 0 refills | Status: DC

## 2021-04-04 MED ORDER — POTASSIUM CHLORIDE CRYS ER 20 MEQ PO TBCR
40.0000 meq | EXTENDED_RELEASE_TABLET | Freq: Once | ORAL | Status: AC
  Administered 2021-04-04: 40 meq via ORAL
  Filled 2021-04-04: qty 2

## 2021-04-04 NOTE — Progress Notes (Addendum)
Advanced Heart Failure Rounding Note   Subjective:   Admit weight 221.8 pounds --->204 pounds  1/9 Diuresing with IV lasix  and switched to torsemide  Scr 2.25 -> 2.05 -> 2.15>>2.23 >>2.24   Denies SOB. Wants to go home.    Objective:   Weight Range:  Vital Signs:   Temp:  [97.9 F (36.6 C)-98.1 F (36.7 C)] 97.9 F (36.6 C) (01/10 0420) Pulse Rate:  [53-62] 53 (01/10 0420) Resp:  [18] 18 (01/10 0420) BP: (96-107)/(52-60) 96/52 (01/10 0420) SpO2:  [98 %-100 %] 98 % (01/10 0420) Weight:  [92.9 kg] 92.9 kg (01/10 0420) Last BM Date:  (04/03/21)  Weight change: Filed Weights   04/02/21 0318 04/03/21 0106 04/04/21 0420  Weight: 94.9 kg 93.4 kg 92.9 kg    Intake/Output:   Intake/Output Summary (Last 24 hours) at 04/04/2021 0937 Last data filed at 04/04/2021 0830 Gross per 24 hour  Intake 1718.12 ml  Output 3575 ml  Net -1856.88 ml     Physical Exam: General:  Well appearing. No resp difficulty HEENT: normal Neck: supple. no JVD. Carotids 2+ bilat; no bruits. No lymphadenopathy or thryomegaly appreciated. Cor: PMI nondisplaced. Regular rate & rhythm. No rubs, gallops or murmurs. Lungs: clear Abdomen: soft, nontender, nondistended. No hepatosplenomegaly. No bruits or masses. Good bowel sounds. Extremities: no cyanosis, clubbing, rash, edema Neuro: alert & orientedx3, cranial nerves grossly intact. moves all 4 extremities w/o difficulty. Affect pleasant  Telemetry:  AF V paced 50-60s   Labs: Basic Metabolic Panel: Recent Labs  Lab 04/01/21 0346 04/01/21 0843 04/02/21 0359 04/03/21 0409 04/04/21 0235  NA 135 138 137 136 134*  K 3.3* 4.1 4.0 4.0 3.2*  CL 98 99 96* 96* 97*  CO2 29 29 29 28 26   GLUCOSE 110* 64* 74 164* 112*  BUN 37* 38* 41* 46* 48*  CREATININE 1.99* 2.05* 2.15* 2.23* 2.24*  CALCIUM 9.1 9.2 8.8* 8.6* 8.2*  MG 2.6*  --  2.3 2.4  --     Liver Function Tests: Recent Labs  Lab 03/31/21 1020  AST 32  ALT 15  ALKPHOS 188*  BILITOT  2.1*  PROT 7.0  ALBUMIN 3.2*   No results for input(s): LIPASE, AMYLASE in the last 168 hours. No results for input(s): AMMONIA in the last 168 hours.  CBC: Recent Labs  Lab 03/31/21 1020 04/01/21 0346 04/02/21 0359 04/03/21 0409 04/04/21 0235  WBC 5.6 5.3 6.2 5.3 5.5  NEUTROABS 3.4  --   --   --   --   HGB 9.0* 9.0* 8.9* 8.8* 8.8*  HCT 27.7* 27.5* 26.7* 27.5* 27.2*  MCV 81.7 78.6* 78.3* 79.0* 78.2*  PLT 129* 122* 123* 110* 113*    Cardiac Enzymes: No results for input(s): CKTOTAL, CKMB, CKMBINDEX, TROPONINI in the last 168 hours.  BNP: BNP (last 3 results) Recent Labs    02/21/21 1431 03/31/21 1018 04/01/21 0346  BNP 1,988.6* 2,067.0* 2,163.5*    ProBNP (last 3 results) No results for input(s): PROBNP in the last 8760 hours.    Other results:  Imaging: No results found.   Medications:     Scheduled Medications:  aspirin EC  81 mg Oral Q breakfast   carvedilol  3.125 mg Oral BID WC   heparin  5,000 Units Subcutaneous Q8H   insulin aspart  0-9 Units Subcutaneous TID WC   linaclotide  145 mcg Oral QAC breakfast   pantoprazole  40 mg Oral Daily   potassium chloride SA  40 mEq Oral  Daily   sodium chloride flush  3 mL Intravenous Q12H   torsemide  80 mg Oral BID    Infusions:  sodium chloride      PRN Medications: sodium chloride, acetaminophen, loratadine, ondansetron (ZOFRAN) IV, sodium chloride flush, traZODone   Assessment/Plan:   1. Acute on Chronic Systolic Heart Failure/NICM: - Echo 08/2015 EF 50-55% (PPM placed in 08/2015)  - Echo 3/22 EF 25-30% TAVR ok. Mod MR .-LHC 9/21 that showed no coronary disease.  - based on timing of drop in EF and 42% pacing percentage, concern for RV paced CM +AS.Left subclavian vein occluded -> unable to upgrade to CRT. - s/p TAVR 2/22 - Echo 10/22 EF 30-35% - Admitted with marked volume overload c/b cardiorenal syndrome. Not responding to intensification of po diuretics - Diuresed with IV lasix. Now on oral  regimen.  - Volume status stable. Continue torsemide 80 mg twice a day. Will need Metolazone 2 times a week. Mon and Fri -Discussed low salt food choices and limiting fluid intake.    2. Acute on chronic Stage IIIb CKD due to cardiorenal syndrome  - Followed by nephrology, Dr. Theador Hawthorne.   - SCr 1.4-1.5 in 8/22 - Scr 2.25 on admit -> 2.05 -> 2.15>2.2>>2.24  - Volume becoming more difficult to control. May need discussion regarding dialysis sooner rather than later.   3. Mitral Regurgitation  - Moderate by TEE 8/21  - mild to moderate on echo 10/22   4. Aortic Stenosis  - s/p TAVR 3/22. - Stable on echo.    5. Atrial Flutter/ Chronic Atrial Fibrillation - s/p AFL ablation.  - Remains in chronic A fib. Rate controlled. - s/p frequent GI bleed so off Eliquis.  - CHA2DS2-VASc Score = at least 5  - Unable to safely restart Eliquis at this time.  - EP felt not a Watchman candidate.   6. T2DM - On insulin. - Off Farxiga due to yeast infection   7. H/o CHB  - s/p PPM followed by Dr. Lovena Le  - ? RV paced CM. He's pacing < 10%. Had drop in EF 2 months after pacer was placed.  - Following with Dr.Taylor. Attempted CRT upgrade 3/22 but left subclavian vein occluded.   8. UGIB with iron deficiency anemia - EGD in 7/22 with duodenal ulcer with visible vessel. Underwent coiling of GDA.  - s/p EGD 11/22 with no active bleed; received 4 U PRBCs. - No evidence of ongoing bleeding. Hgb 9.0 MCV 78 - Iron 39. Iron sats 9%.  - 1/9 Given feraheme.    Home diuretics torsemide 80 mg twice  day + 2.5 mg metolazone Monday and Friday. HF f/u set up for next week.   Consider cardiomems as an outpatient. He he has signed application. He is not ASA or anticoagulants due to GI bleed which may preclude implant.   Length of Stay: 4   Amy Clegg NP-C  04/04/2021, 9:37 AM  Advanced Heart Failure Team Pager [phone] (M-F; 7a - 4p)  Please contact [facility] for night-coverage after hours (4p -7a )  and weekends on [email or web address]   Patient seen and examined with the above-signed Advanced Practice Provider and/or Housestaff. I personally reviewed laboratory data, imaging studies and relevant notes. I independently examined the patient and formulated the important aspects of the plan. I have edited the note to reflect any of my changes or salient points. I have personally discussed the plan with the patient and/or family.  He looks good. Weight below baseline. Breathing  better than he has in a long time. No orthopnea or PND. SCr stable.   General:  Well appearing. No resp difficulty HEENT: normal Neck: supple. no JVD. Carotids 2+ bilat; no bruits. No lymphadenopathy or thryomegaly appreciated. Cor: PMI nondisplaced. Regular rate & rhythm. No rubs, gallops or murmurs. Lungs: clear Abdomen: soft, nontender, nondistended. No hepatosplenomegaly. No bruits or masses. Good bowel sounds. Extremities: no cyanosis, clubbing, rash, edema Neuro: alert & orientedx3, cranial nerves grossly intact. moves all 4 extremities w/o difficulty. Affect pleasant  Ok for d/c today on above regimen. Will arrange for outpatient Cardiomems placement if we can get approval.    Glori Bickers, MD  11:10 AM

## 2021-04-04 NOTE — TOC CM/SW Note (Signed)
HF TOC CM contacted [facility] to make aware of scheduled dc home today with resumption of HH with HHRN added to orders. [place], Heart Failure TOC CM [phone]

## 2021-04-04 NOTE — Discharge Summary (Signed)
Discharge Summary  John [name] WPY:[id number] DOB: 05-16-1945  PCP: [name], MD  Admit date: 03/31/2021 Discharge date: 04/04/2021  Time spent: 35 minutes.  Recommendations for Outpatient Follow-up:  Follow-up with cardiology. Follow-up with your primary care provider. Take your medications as prescribed. Continue PT OT with assistance and fall precautions.  Discharge Diagnoses:  Active Hospital Problems   Diagnosis Date Noted   Acute heart failure ([place]) 03/31/2021   GERD (gastroesophageal reflux disease) 12/31/2020   SOB (shortness of breath) 12/31/2020   Polyarticular gout 05/15/2020   S/P TAVR (transcatheter aortic valve replacement) 05/03/2020   Thrombocytopenia ([place]) 03/09/2020   Hypoalbuminemia 03/09/2020   NASH (nonalcoholic steatohepatitis) 03/03/2020   Lower leg edema 10/14/2019   Acute on chronic combined systolic (congestive) and diastolic (congestive) heart failure ([place]) 11/12/2018   Hyponatremia 11/11/2018   CKD (chronic kidney disease) stage 3, GFR 30-59 ml/min (HCC)    Paroxysmal atrial fibrillation ([place])    Pacemaker 03/02/2018   DM type 2 causing vascular disease ([place]) 12/23/2009   Mixed hyperlipidemia 12/23/2009   Essential hypertension, benign 12/23/2009    Resolved Hospital Problems  No resolved problems to display.    Discharge Condition: Stable.  Diet recommendation: Heart healthy carb modified diet.  Vitals:   04/03/21 1936 04/04/21 0420  BP: (!) 102/56 (!) 96/52  Pulse: 60 (!) 53  Resp: 18 18  Temp: 98.1 F (36.7 C) 97.9 F (36.6 C)  SpO2: 100% 98%    History of present illness:  John [name] is an 76 y.o. male with medical history significant for atrial flutter, chronic A. fib, chronic kidney disease, heart failure, severe aortic stenosis status post TAVR, anemia of chronic disease, paroxysmal SVT, NICM, hypertension, hyperlipidemia, type 2 diabetes mellitus, insulin requiring, uncontrolled with A1c>9, gout and other  comorbidities detailed below who presents to [facility] ED with worsening shortness of breath for the past several days.  Associated with 15 pounds weight gain.  Seen by cardiology and received IV diuresing.  OK to dc from cardiology standpoint.  Net I&O -5.9L since admission.   04/04/2021: Patient was seen at his bedside.  There were no acute events overnight.  He has no new complaints.  He is eager to go home.  Hospital Course:  Principal Problem:   Acute heart failure (HCC) Active Problems:   DM type 2 causing vascular disease ([place])   Mixed hyperlipidemia   Essential hypertension, benign   Pacemaker   CKD (chronic kidney disease) stage 3, GFR 30-59 ml/min (HCC)   Paroxysmal atrial fibrillation (HCC)   Hyponatremia   Acute on chronic combined systolic (congestive) and diastolic (congestive) heart failure (HCC)   Lower leg edema   NASH (nonalcoholic steatohepatitis)   Thrombocytopenia (HCC)   Hypoalbuminemia   S/P TAVR (transcatheter aortic valve replacement)   Polyarticular gout   SOB (shortness of breath)   GERD (gastroesophageal reflux disease)  Acute HFrEF  - Pt had recent Echo October 2022 with EF 30-35% - He presents with a 15 pounds weight gain and volume overload -Seen by cardiology, received IV diuresing. -Monitor daily weights, intake and output and electrolytes closely -Consult to advanced heart failure team (transferred from [facility] to [facility]) appreciated:  Ongoing diuresing, currently on IV Lasix 80 mg twice daily. Continue to closely monitor renal function, urine output and electrolytes. Replete electrolytes as indicated Net I&O -3.8 L since admission Management per heart failure team. Home diuretics torsemide 80 mg twice  day + 2.5 mg metolazone Monday and Friday. HF  f/u set up for next week, per cardiology.    Type 2 diabetes mellitus with hyperglycemia Hemoglobin A1c 6.2 on 03/31/2021. Resume home regimen but hold off Tresiba to avoid hypoglycemia. Continue home  Trulicity Follow-up with your PCP.   Atrial flutter/chronic atrial fibrillation -Patient's status post ablation -Heart rate currently controlled, rhythm remains irregular. -Patient not anticoagulated due to history of frequent GI bleeding -EP felt not a Watchman candidate. Follow-up with cardiology.   Aortic stenosis s/p TAVR 3/22 -  stable on echo.   Anemia in chronic kidney disease superimposed on iron deficiency anemia. -Baseline hemoglobin 9.0. Hemoglobin 8.8, MCV 79. Iron studies suggestive of iron deficiency. Received IV Feraheme infusion on 04/03/2021. No overt bleeding Follow-up with your PCP.   Maintain hemoglobin greater than 8.0.   Stage 3b CKD  Appears to be at his baseline creatinine 2.3 with GFR of 30. Continue to closely monitor urine output Continue to avoid nephrotoxic agents and hypotension. Continue to monitor renal panel.         Code Status: Full Code.     Medical Consultants:    CHF team   Discharge Exam: BP (!) 96/52 (BP Location: Left Arm)    Pulse (!) 53    Temp 97.9 F (36.6 C) (Oral)    Resp 18    Ht 6' (1.829 m)    Wt 92.9 kg    SpO2 98%    BMI 27.79 kg/m  General: 76 y.o. year-old male well developed well nourished in no acute distress.  Alert and oriented x3. Cardiovascular: Regular rate and rhythm with no rubs or gallops.  No thyromegaly or JVD noted.   Respiratory: Clear to auscultation with no wheezes or rales. Good inspiratory effort. Abdomen: Soft nontender nondistended with normal bowel sounds x4 quadrants. Musculoskeletal: Trace lower edema in lower extremities bilaterally.   Psychiatry: Mood is appropriate for condition and setting  Discharge Instructions You were cared for by a hospitalist during your hospital stay. If you have any questions about your discharge medications or the care you received while you were in the hospital after you are discharged, you can call the unit and asked to speak with the hospitalist on call if  the hospitalist that took care of you is not available. Once you are discharged, your primary care physician will handle any further medical issues. Please note that NO REFILLS for any discharge medications will be authorized once you are discharged, as it is imperative that you return to your primary care physician (or establish a relationship with a primary care physician if you do not have one) for your aftercare needs so that they can reassess your need for medications and monitor your lab values.   Allergies as of 04/04/2021   No Known Allergies      Medication List     STOP taking these medications    colchicine 0.6 MG tablet   spironolactone 25 MG tablet Commonly known as: ALDACTONE   Tresiba FlexTouch 100 UNIT/ML FlexTouch Pen Generic drug: insulin degludec       TAKE these medications    Accu-Chek Aviva Plus test strip Generic drug: glucose blood TEST BLOOD SUGAR TWICE DAILY BEFORE BREAKFAST AND AT BEDTIME   acetaminophen 325 MG tablet Commonly known as: TYLENOL Take 2 tablets (650 mg total) by mouth every 4 (four) hours as needed for headache or mild pain.   allopurinol 300 MG tablet Commonly known as: ZYLOPRIM Take 150 mg by mouth as needed (gout flare).   Anti-Itch  lotion Generic drug: camphor-menthol Apply 1 application topically daily as needed for itching.   aspirin EC 81 MG tablet Take 1 tablet (81 mg total) by mouth daily with breakfast.   BD Pen Needle Nano U/F 32G X 4 MM Misc Generic drug: Insulin Pen Needle 1 each by Does not apply route 4 (four) times daily.   benzonatate 100 MG capsule Commonly known as: TESSALON Take 1 capsule (100 mg total) by mouth 3 (three) times daily as needed for cough.   carvedilol 3.125 MG tablet Commonly known as: COREG Take 1 tablet (3.125 mg total) by mouth 2 (two) times daily with a meal.   fexofenadine 180 MG tablet Commonly known as: ALLEGRA Take 1 tablet by mouth daily.   hydrOXYzine 10 MG  tablet Commonly known as: ATARAX Take 10 mg by mouth 3 (three) times daily as needed for itching.   linaclotide 145 MCG Caps capsule Commonly known as: Linzess Take 1 capsule (145 mcg total) by mouth daily before breakfast.   metolazone 2.5 MG tablet Commonly known as: ZAROXOLYN Take 1 tablet (2.5 mg total) by mouth every Friday. Start taking on: April 07, 2021 What changed:  medication strength how much to take when to take this   metolazone 2.5 MG tablet Commonly known as: ZAROXOLYN Take 1 tablet (2.5 mg total) by mouth every Monday. Every Monday Start taking on: April 10, 2021 What changed: when to take this   One-A-Day Mens 50+ Tabs Take 1 tablet by mouth daily with breakfast. Men   pantoprazole 40 MG tablet Commonly known as: PROTONIX Take 1 tablet (40 mg total) by mouth daily.   potassium chloride SA 20 MEQ tablet Commonly known as: Klor-Con M20 Take 2 tablets (40 mEq total) by mouth daily. Start taking on: April 05, 2021 What changed: when to take this   Torsemide 40 MG Tabs Take 80 mg by mouth 2 (two) times daily. What changed:  medication strength how much to take   Trulicity 1.5 QV/9.5GL Sopn Generic drug: Dulaglutide INJECT 1.5MG (1 PEN) SUBCUTANEOUSLY EVERY WEEK What changed: See the new instructions.       No Known Allergies  Follow-up Information     [facility] Follow up on 04/10/2021.   Specialty: Cardiology Why: at  3:30. Garage Code 1202 Contact information: [address] [id number] [place], [facility] Follow up.   Specialty: Home Health Services Why: Home Health RN and Physical Therapy-agency will call to arrange appt Contact information: [place] [address] [phone]         [name], MD. Go on 04/13/2021.   Specialty: Internal Medicine Why: @9 :10am Contact  information: [facility] [address] [phone]         Arnoldo Lenis, MD .   Specialty: Cardiology Contact information: [place] [address] [phone]         Evans Lance, MD .   Specialty: Cardiology Contact information: [place] [address] [phone]                  The results of significant diagnostics from this hospitalization (including imaging, microbiology, ancillary and laboratory) are listed below for reference.    Significant Diagnostic Studies: DG Chest 2 View  Result Date: 03/31/2021 CLINICAL DATA:  BILATERAL rales, BILATERAL leg swelling,  and shortness of breath for 2 weeks, history CHF, former smoker EXAM: CHEST - 2 VIEW COMPARISON:  12/30/2020 FINDINGS: LEFT subclavian pacemaker leads project at RIGHT atrium and RIGHT ventricle. Enlargement of cardiac silhouette with pulmonary vascular congestion post TAVR. Stable mediastinal contours. Accentuation of interstitial markings, better demonstrated on prior exam, likely chronic interstitial lung disease. Minimal atelectasis at posterior lung bases. No definite acute infiltrate, pleural effusion, or pneumothorax. No acute osseous findings. IMPRESSION: Enlargement of cardiac silhouette with pulmonary vascular congestion post pacemaker and TAVR. Chronic interstitial lung disease with minimal bibasilar atelectasis. No acute infiltrate. Electronically Signed   By: Lavonia Dana M.D.   On: 03/31/2021 10:14   DG Chest Port 1 View  Result Date: 04/01/2021 CLINICAL DATA:  Acute heart failure. EXAM: PORTABLE CHEST 1 VIEW COMPARISON:  Chest radiograph dated 08/29/2021. FINDINGS: There is cardiomegaly with vascular congestion and edema. Pneumonia is not excluded clinical correlation is recommended. No pleural effusion pneumothorax. Left pectoral pacemaker device. Aortic valve repair. No acute osseous pathology. IMPRESSION: Cardiomegaly with mild  vascular congestion and edema. Pneumonia is not excluded. Electronically Signed   By: Anner Crete M.D.   On: 04/01/2021 02:45   US Abdomen Limited RUQ (LIVER/GB)  Result Date: 03/09/2021 CLINICAL DATA:  Hepatic cirrhosis. EXAM: ULTRASOUND ABDOMEN LIMITED RIGHT UPPER QUADRANT COMPARISON:  Ultrasound liver including Doppler velocities 02/09/2021, CT abdomen and pelvis with no contrast 01/26/2021. FINDINGS: Gallbladder: Partially contracted. Shadowing stones are noted distally. The free wall is thickened up to 4.9 mm but there was no elicited positive sonographic Murphy's sign. There is trace pericholecystic fluid. Common bile duct: Diameter: 3.8 mm. Liver: No focal lesion identified. Lobular surface contour consistent with cirrhosis is again seen, with limited visualization of the left lobe due to bowel gas shadowing. There is mild increased echogenicity consistent with a degree of steatosis. Portal vein is patent on color Doppler imaging with normal direction of blood flow towards the liver. Other: There is mild upper abdominal free ascites which was not seen on either prior study there is also a small versus moderate-sized right pleural effusion. IMPRESSION: 1. Hepatic cirrhosis.  No portal vein dilatation or flow reversal. 2. Cholelithiasis with thickened gallbladder but no positive sonographic Murphy's sign. Findings could be due to hepatic dysfunction with passive congestion or chronic cholecystitis. Clinical correlation advised. There is trace pericholecystic fluid as well. 3. Mild upper abdominal free ascites and a small or moderate sized right pleural effusion. Electronically Signed   By: Telford Nab M.D.   On: 03/09/2021 01:56    Microbiology: Recent Results (from the past 240 hour(s))  Resp Panel by RT-PCR (Flu A&B, Covid) Nasopharyngeal Swab     Status: None   Collection Time: 03/31/21  9:48 AM   Specimen: Nasopharyngeal Swab; Nasopharyngeal(NP) swabs in vial transport medium  Result  Value Ref Range Status   SARS Coronavirus 2 by RT PCR NEGATIVE NEGATIVE Final    Comment: (NOTE) SARS-CoV-2 target nucleic acids are NOT DETECTED.  The SARS-CoV-2 RNA is generally detectable in upper respiratory specimens during the acute phase of infection. The lowest concentration of SARS-CoV-2 viral copies this assay can detect is 138 copies/mL. A negative result does not preclude SARS-Cov-2 infection and should not be used as the sole basis for treatment or other patient management decisions. A negative result may occur with  improper specimen collection/handling, submission of specimen other than nasopharyngeal swab, presence of viral mutation(s) within the areas targeted by this assay, and inadequate number of viral copies(<138 copies/mL). A  negative result must be combined with clinical observations, patient history, and epidemiological information. The expected result is Negative.  Fact Sheet for Patients:  EntrepreneurPulse.com.au  Fact Sheet for Healthcare Providers:  IncredibleEmployment.be  This test is no t yet approved or cleared by the [place] FDA and  has been authorized for detection and/or diagnosis of SARS-CoV-2 by FDA under an Emergency Use Authorization (EUA). This EUA will remain  in effect (meaning this test can be used) for the duration of the COVID-19 declaration under Section 564(b)(1) of the Act, 21 U.S.C.section 360bbb-3(b)(1), unless the authorization is terminated  or revoked sooner.       Influenza A by PCR NEGATIVE NEGATIVE Final   Influenza B by PCR NEGATIVE NEGATIVE Final    Comment: (NOTE) The Xpert Xpress SARS-CoV-2/FLU/RSV plus assay is intended as an aid in the diagnosis of influenza from Nasopharyngeal swab specimens and should not be used as a sole basis for treatment. Nasal washings and aspirates are unacceptable for Xpert Xpress SARS-CoV-2/FLU/RSV testing.  Fact Sheet for  Patients: EntrepreneurPulse.com.au  Fact Sheet for Healthcare Providers: IncredibleEmployment.be  This test is not yet approved or cleared by the [place] FDA and has been authorized for detection and/or diagnosis of SARS-CoV-2 by FDA under an Emergency Use Authorization (EUA). This EUA will remain in effect (meaning this test can be used) for the duration of the COVID-19 declaration under Section 564(b)(1) of the Act, 21 U.S.C. section 360bbb-3(b)(1), unless the authorization is terminated or revoked.  Performed at [facility], [address] [place], [place] [address]      Labs: Basic Metabolic Panel: Recent Labs  Lab 04/01/21 0346 04/01/21 0843 04/02/21 0359 04/03/21 0409 04/04/21 0235  NA 135 138 137 136 134*  K 3.3* 4.1 4.0 4.0 3.2*  CL 98 99 96* 96* 97*  CO2 29 29 29 28 26   GLUCOSE 110* 64* 74 164* 112*  BUN 37* 38* 41* 46* 48*  CREATININE 1.99* 2.05* 2.15* 2.23* 2.24*  CALCIUM 9.1 9.2 8.8* 8.6* 8.2*  MG 2.6*  --  2.3 2.4  --    Liver Function Tests: Recent Labs  Lab 03/31/21 1020  AST 32  ALT 15  ALKPHOS 188*  BILITOT 2.1*  PROT 7.0  ALBUMIN 3.2*   No results for input(s): LIPASE, AMYLASE in the last 168 hours. No results for input(s): AMMONIA in the last 168 hours. CBC: Recent Labs  Lab 03/31/21 1020 04/01/21 0346 04/02/21 0359 04/03/21 0409 04/04/21 0235  WBC 5.6 5.3 6.2 5.3 5.5  NEUTROABS 3.4  --   --   --   --   HGB 9.0* 9.0* 8.9* 8.8* 8.8*  HCT 27.7* 27.5* 26.7* 27.5* 27.2*  MCV 81.7 78.6* 78.3* 79.0* 78.2*  PLT 129* 122* 123* 110* 113*   Cardiac Enzymes: No results for input(s): CKTOTAL, CKMB, CKMBINDEX, TROPONINI in the last 168 hours. BNP: BNP (last 3 results) Recent Labs    02/21/21 1431 03/31/21 1018 04/01/21 0346  BNP 1,988.6* 2,067.0* 2,163.5*    ProBNP (last 3 results) No results for input(s): PROBNP in the last 8760 hours.  CBG: Recent Labs  Lab 04/03/21 1151 04/03/21 1514  04/03/21 2114 04/04/21 0542 04/04/21 0556  GLUCAP 238* 183* 137* 106* 116*       Signed:  Kayleen Memos, MD [facility] 04/04/2021, 10:34 AM

## 2021-04-10 ENCOUNTER — Other Ambulatory Visit: Payer: Self-pay

## 2021-04-10 ENCOUNTER — Ambulatory Visit (HOSPITAL_COMMUNITY)
Admit: 2021-04-10 | Discharge: 2021-04-10 | Disposition: A | Discharge status: Home / Self Care | Payer: Medicare HMO | Source: Ambulatory Visit | Attending: Family Medicine | Admitting: Family Medicine

## 2021-04-10 ENCOUNTER — Encounter (HOSPITAL_COMMUNITY): Payer: Self-pay

## 2021-04-10 VITALS — BP 124/66 | HR 61 | Wt 208.2 lb

## 2021-04-10 DIAGNOSIS — I130 Hypertensive heart and chronic kidney disease with heart failure and stage 1 through stage 4 chronic kidney disease, or unspecified chronic kidney disease: Secondary | ICD-10-CM | POA: Diagnosis not present

## 2021-04-10 DIAGNOSIS — I340 Nonrheumatic mitral (valve) insufficiency: Secondary | ICD-10-CM | POA: Diagnosis not present

## 2021-04-10 DIAGNOSIS — I4892 Unspecified atrial flutter: Secondary | ICD-10-CM | POA: Diagnosis not present

## 2021-04-10 DIAGNOSIS — Z87891 Personal history of nicotine dependence: Secondary | ICD-10-CM | POA: Diagnosis not present

## 2021-04-10 DIAGNOSIS — Z66 Do not resuscitate: Secondary | ICD-10-CM | POA: Diagnosis not present

## 2021-04-10 DIAGNOSIS — Z952 Presence of prosthetic heart valve: Secondary | ICD-10-CM | POA: Diagnosis not present

## 2021-04-10 DIAGNOSIS — Z8679 Personal history of other diseases of the circulatory system: Secondary | ICD-10-CM

## 2021-04-10 DIAGNOSIS — Z7901 Long term (current) use of anticoagulants: Secondary | ICD-10-CM | POA: Insufficient documentation

## 2021-04-10 DIAGNOSIS — I4819 Other persistent atrial fibrillation: Secondary | ICD-10-CM | POA: Diagnosis not present

## 2021-04-10 DIAGNOSIS — Z8719 Personal history of other diseases of the digestive system: Secondary | ICD-10-CM

## 2021-04-10 DIAGNOSIS — Z79899 Other long term (current) drug therapy: Secondary | ICD-10-CM | POA: Diagnosis not present

## 2021-04-10 DIAGNOSIS — I5022 Chronic systolic (congestive) heart failure: Secondary | ICD-10-CM | POA: Insufficient documentation

## 2021-04-10 DIAGNOSIS — E1122 Type 2 diabetes mellitus with diabetic chronic kidney disease: Secondary | ICD-10-CM | POA: Insufficient documentation

## 2021-04-10 DIAGNOSIS — Z09 Encounter for follow-up examination after completed treatment for conditions other than malignant neoplasm: Secondary | ICD-10-CM | POA: Insufficient documentation

## 2021-04-10 DIAGNOSIS — E119 Type 2 diabetes mellitus without complications: Secondary | ICD-10-CM

## 2021-04-10 DIAGNOSIS — I350 Nonrheumatic aortic (valve) stenosis: Secondary | ICD-10-CM | POA: Diagnosis not present

## 2021-04-10 DIAGNOSIS — Z950 Presence of cardiac pacemaker: Secondary | ICD-10-CM | POA: Insufficient documentation

## 2021-04-10 DIAGNOSIS — I482 Chronic atrial fibrillation, unspecified: Secondary | ICD-10-CM | POA: Insufficient documentation

## 2021-04-10 DIAGNOSIS — R531 Weakness: Secondary | ICD-10-CM | POA: Insufficient documentation

## 2021-04-10 DIAGNOSIS — I428 Other cardiomyopathies: Secondary | ICD-10-CM | POA: Diagnosis not present

## 2021-04-10 DIAGNOSIS — I080 Rheumatic disorders of both mitral and aortic valves: Secondary | ICD-10-CM | POA: Insufficient documentation

## 2021-04-10 DIAGNOSIS — Z794 Long term (current) use of insulin: Secondary | ICD-10-CM | POA: Insufficient documentation

## 2021-04-10 DIAGNOSIS — N1832 Chronic kidney disease, stage 3b: Secondary | ICD-10-CM

## 2021-04-10 DIAGNOSIS — D509 Iron deficiency anemia, unspecified: Secondary | ICD-10-CM | POA: Insufficient documentation

## 2021-04-10 LAB — CBC
HCT: 30.9 % — ABNORMAL LOW (ref 39.0–52.0)
Hemoglobin: 10.1 g/dL — ABNORMAL LOW (ref 13.0–17.0)
MCH: 26.4 pg (ref 26.0–34.0)
MCHC: 32.7 g/dL (ref 30.0–36.0)
MCV: 80.7 fL (ref 80.0–100.0)
Platelets: 121 10*3/uL — ABNORMAL LOW (ref 150–400)
RBC: 3.83 MIL/uL — ABNORMAL LOW (ref 4.22–5.81)
RDW: 25.7 % — ABNORMAL HIGH (ref 11.5–15.5)
WBC: 5.9 10*3/uL (ref 4.0–10.5)
nRBC: 0 % (ref 0.0–0.2)

## 2021-04-10 LAB — BASIC METABOLIC PANEL
Anion gap: 13 (ref 5–15)
BUN: 52 mg/dL — ABNORMAL HIGH (ref 8–23)
CO2: 26 mmol/L (ref 22–32)
Calcium: 9.3 mg/dL (ref 8.9–10.3)
Chloride: 96 mmol/L — ABNORMAL LOW (ref 98–111)
Creatinine, Ser: 1.82 mg/dL — ABNORMAL HIGH (ref 0.61–1.24)
GFR, Estimated: 38 mL/min — ABNORMAL LOW (ref >60)
Glucose, Bld: 227 mg/dL — ABNORMAL HIGH (ref 70–99)
Potassium: 3.4 mmol/L — ABNORMAL LOW (ref 3.5–5.1)
Sodium: 135 mmol/L (ref 135–145)

## 2021-04-10 NOTE — Progress Notes (Signed)
ReDS Vest / Clip - 04/10/21 1600       ReDS Vest / Clip   Station Marker D    Ruler Value 33    ReDS Value Range Low volume    ReDS Actual Value 33

## 2021-04-10 NOTE — Progress Notes (Signed)
ADVANCED HF CLINIC NOTE  Primary Care: [name], MD Primary Cardiologist: Dr. Harl Bowie  HF Cardiologist: Dr. Haroldine Laws  Reason for Visit: Chronic Systolic Heart Failure   HPI: [name] is a 76 y.o. male w/ systolic HF due to NICM (cath 9/21 no CAD), h/o CHB s/p PPM 2017,  HTN, DM2, atrial flutter, stage 3b CKD and severe AS s/p TAVR 2/22.   Echo 7/21 EF 30-35%. RV mildly reduced. Mod-sev MR Moderate AS  TEE 8/21 moderate MR and moderate TR. AoV severely thickened/heavily calcified  [facility] 9/21 No CAD. Moderately elevated filling pressures and moderately reduced CO. CI 1.8. Also concern for RV pacing CM with 42% RV pacing  Several admits in 12/21 for ADHF.   Admitted 2/22 for severe HF and TAVR. Started on dobutamine support. Underwent TVRF 05/03/20. Diuresed and DBA weaned off.   Attempted CRT upgrade 3/22. Left subclavian vein occluded.  Echo 3/22: EF 25-30% moderate MR. TAVR stable  Admitted 7/22 for anemia and hyponatremia. Hgb 14 -> 7.3  got 2u RBCs. EGD with duodenal ulcer with visible vessel so Eliquis held. Could not adequately clip so underwent IR embolization.  Weekly metolazone restarted 9/22.  Admitted 10/8-10/10/22 for A/C CHF. He was diuresed with IV lasix + metolazone. Hospitalization c/b AKI on CKD3. Discharge weight 209 lbs.  Follow up 10/22 weight up 13 lbs in setting of being out of metolazone x 2 weeks. Instructed to take metolazone for 2 days then resume weekly.  Admitted 11/3-11/6/22 w/ acute diverticulitis. GI consulted and underwent EGD 11/4 showing nonobstructive schatzki ring; portal HTN gastropathy; & healed ulcer in duodenal bulb without stigmata of bleeding. Received 4 U PRBCs during hospitalization. He remained DNR.  Admitted 11/22 with GIB, hgb 6.4. GI consulted and had capsule endoscopy 02/07/21 showing old blood in the stomach but no new findings.Received 4 U PRBCs and octreotide, GI plans for outpatient colonoscopy. Cards consulted after he  became hypotensive requiring NE. Diuretics held with bump in SCr, but torsemide 20 mg bid resumed at discharge. Discharge weight 215 lbs, hgb 8.5.  Post hospital follow up, weight 214 lbs, ReDs 37% on torsemide 40 bid and metolazone 2.5 once a week. Breathing better with minimal LE swelling.   Saw Dr. Quentin Ore 12/28 for Watchman consideration, volume still up.   Admitted 1/23 for a/c CHF exacerbation after presenting to clinic with massive volume overload; had failed increased outpatient diuretic regimen. Diuresed 22 lbs with IV lasix. Hospitalization c/b cardiorenal syndrome. Discharged home on torsemide 80 mg bid + metolazone 2.5 on Mondays and Fridays. Cardiomems placement discussed, weight 204 lbs.  Today he returns for post hospital HF follow up with his wife. He is weak today and wife says he is not eating much. He is not SOB with minimal activity. Denies palpitations, abnormal bleeding, CP, dizziness, edema, or PND/Orthopnea. Appetite fair. No fever or chills. Weight at home 202 pounds this morning. Taking all medications. Blood sugars at home 70-80s. Off of Tyler Aas as of most recent hospitalization.  Past Medical History:  Diagnosis Date   Atrial flutter ([place]) 12/2010   Admitted with symptomatic bradycardia (HR 40s), atrial flutter with slow ventricular response 12/2010 + volume overload; AV nodal agents d/c'd and Pradaxa started; RFA in 01/2011   Chronic kidney disease, stage 3b (HCC)    Chronic systolic CHF (congestive heart failure) (HCC)    Class 2 severe obesity due to excess calories with serious comorbidity and body mass index (BMI) of 37.0 to 37.9 in adult [facility])  07/17/2017   Diabetes mellitus    non insulin dependant   Hematoma, chest wall 05/15/2020   Hyperlipidemia    Hypertension    Mitral regurgitation    NASH (nonalcoholic steatohepatitis)    NICM (nonischemic cardiomyopathy) (HCC)    Osteoarthritis    Polyarticular gout 05/15/2020   Presence of permanent cardiac  pacemaker    PSVT (paroxysmal supraventricular tachycardia) (HCC)    Possibly atrial flutter   S/P TAVR (transcatheter aortic valve replacement) 05/03/2020   s/p TAVR with a 29 mm Edwards S3U via the subclavian approach with Dr. Angelena Form & Dr. Cyndia Bent    Severe aortic stenosis    Current Outpatient Medications  Medication Sig Dispense Refill   ACCU-CHEK AVIVA PLUS test strip TEST BLOOD SUGAR TWICE DAILY BEFORE BREAKFAST AND AT BEDTIME 200 strip 1   acetaminophen (TYLENOL) 325 MG tablet Take 2 tablets (650 mg total) by mouth every 4 (four) hours as needed for headache or mild pain.     allopurinol (ZYLOPRIM) 300 MG tablet Take 150 mg by mouth as needed (gout flare).     aspirin EC 81 MG tablet Take 1 tablet (81 mg total) by mouth daily with breakfast. 30 tablet 2   benzonatate (TESSALON) 100 MG capsule Take 1 capsule (100 mg total) by mouth 3 (three) times daily as needed for cough. 90 capsule 11   camphor-menthol (ANTI-ITCH) lotion Apply 1 application topically daily as needed for itching.     carvedilol (COREG) 3.125 MG tablet Take 1 tablet (3.125 mg total) by mouth 2 (two) times daily with a meal. 60 tablet 0   fexofenadine (ALLEGRA) 180 MG tablet Take 1 tablet by mouth daily.     hydrOXYzine (ATARAX) 10 MG tablet Take 10 mg by mouth 3 (three) times daily as needed.     Insulin Pen Needle (BD PEN NEEDLE NANO U/F) 32G X 4 MM MISC 1 each by Does not apply route 4 (four) times daily. 150 each 5   linaclotide (LINZESS) 145 MCG CAPS capsule Take 1 capsule (145 mcg total) by mouth daily before breakfast. 90 capsule 3   metolazone (ZAROXOLYN) 2.5 MG tablet Take 1 tablet (2.5 mg total) by mouth every Monday. Every Monday (Patient taking differently: Take 2.5 mg by mouth every Monday. Patient takes 1 tablet by mouth on Mondays and Fridays.) 12 tablet 0   Multiple Vitamins-Minerals (ONE-A-DAY MENS 50+) TABS Take 1 tablet by mouth daily with breakfast. Men     pantoprazole (PROTONIX) 40 MG tablet Take 1  tablet (40 mg total) by mouth daily. 60 tablet 1   potassium chloride SA (KLOR-CON M20) 20 MEQ tablet Take 2 tablets (40 mEq total) by mouth daily. 60 tablet 0   torsemide 40 MG TABS Take 80 mg by mouth 2 (two) times daily. 40 tablet 0   TRULICITY 1.5 HY/8.6VH SOPN INJECT 1.5MG (1 PEN) SUBCUTANEOUSLY EVERY WEEK (Patient taking differently: Inject 1.5 mg into the skin once a week.) 2 mL 2   No current facility-administered medications for this encounter.   No Known Allergies  Social History   Socioeconomic History   Marital status: Married    Spouse name: Not on file   Number of children: 2   Years of education: Not on file   Highest education level: Not on file  Occupational History   Occupation: Designer, industrial/product    Employer: RETIRED  Tobacco Use   Smoking status: Former    Packs/day: 1.00    Years: 10.00    Pack  years: 10.00    Types: Cigarettes    Quit date: 03/26/1986    Years since quitting: 35.0   Smokeless tobacco: Never   Tobacco comments:    "stopped cigarette  smoking 1988"  Vaping Use   Vaping Use: Never used  Substance and Sexual Activity   Alcohol use: Not Currently    Alcohol/week: 0.0 standard drinks    Comment: "quit alcohol ~ 2007"   Drug use: No   Sexual activity: Yes    Partners: Female  Other Topics Concern   Not on file  Social History Narrative   Not on file   Social Determinants of Health   Financial Resource Strain: Not on file  Food Insecurity: No Food Insecurity   Worried About Running Out of Food in the Last Year: Never true   Ran Out of Food in the Last Year: Never true  Transportation Needs: No Transportation Needs   Lack of Transportation (Medical): No   Lack of Transportation (Non-Medical): No  Physical Activity: Not on file  Stress: Not on file  Social Connections: Not on file  Intimate Partner Violence: Not on file   Family History  Problem Relation Age of Onset   Heart attack Mother    Hypertension Mother    Heart  attack Father    Hypertension Father    Heart attack Brother    Colon cancer Neg Hx    BP 124/66    Pulse 61    Wt 94.4 kg (208 lb 3.2 oz)    SpO2 99%    BMI 28.24 kg/m   Wt Readings from Last 3 Encounters:  04/10/21 94.4 kg (208 lb 3.2 oz)  04/04/21 92.9 kg (204 lb 14.4 oz)  03/30/21 102.9 kg (226 lb 12.8 oz)   PHYSICAL EXAM: General:  NAD. No resp difficulty, elderly, arrived in [facility], fatigued appearing. HEENT: Normal Neck: Supple. No JVD. Carotids 2+ bilat; no bruits. No lymphadenopathy or thryomegaly appreciated. Cor: PMI nondisplaced. Irregular rate & rhythm. No rubs, gallops or murmurs. Lungs: Clear Abdomen: Soft, nontender, nondistended. No hepatosplenomegaly. No bruits or masses. Good bowel sounds. Extremities: No cyanosis, clubbing, rash, edema Neuro: Alert & oriented x 3, cranial nerves grossly intact. Moves all 4 extremities w/o difficulty. Affect pleasant.  ECG (personally reviewed): atrial fibrillation, v-pacing.  Device interrogation: 7.3 % v-pacing (personally reviewed)  ReDs: 33%  ASSESSMENT & PLAN: 1. Chronic Systolic Heart Failure/NICM: - Echo 6/17 EF 50-55% (PPM placed in 08/2015)  - Echo 3/22 EF 25-30% TAVR ok. Mod MR .-LHC 9/21 that showed no coronary disease.  - based on timing of drop in EF and 42% pacing percentage, concern for RV paced CM +AS.Left subclavian vein occluded -> unable to upgrade to CRT. - s/p TAVR 2/22. - Echo 10/22 EF 30-35%. - NYHA III, functional status confounded by general physical deconditioning. - Volume status stable today, ReDs 33%.  - Continue torsemide 80 mg bid + metolazone 2 times a week. Mon and Fri. - Continue carvedilol 3.125 mg bid. - Discussed low salt food choices and limiting fluid intake.  - They wish to proceed with Cardiomems. Awaiting insurance approval. - BMET today.   2. Stage IIIb CKD due to cardiorenal syndrome  - Followed by nephrology, Dr. Theador Hawthorne.   - SCr 1.4-1.5 in 8/22. - New SCr baseline ~ 2.24 -  Volume becoming more difficult to control. May need discussion regarding dialysis sooner rather than later. - BMET today.   3. Mitral Regurgitation  - Moderate by TEE 8/21  -  Mild to moderate on echo 10/22.   4. Aortic Stenosis  - s/p TAVR 3/22. - Stable on echo.    5. Atrial Flutter/ Chronic Atrial Fibrillation - s/p AFL ablation.  - Remains in chronic A fib. Rate controlled. - s/p frequent GI bleed so off Eliquis.  - CHA2DS2-VASc Score = at least 5.  - Unable to safely restart Eliquis. - EP felt not a Watchman candidate.   6. T2DM - On insulin. - Off Farxiga due to yeast infection. - Off Tyler Aas as of recent admission due to risk for hypoglycemia. Advised follow up with Endocrinology/PCP for further insulin instructions. - Continue to check blood sugars at home.   7. H/o CHB  - s/p PPM followed by Dr. Lovena Le. - ? RV paced CM. He's pacing < 10%. Had drop in EF 2 months after pacer was placed.  - Following with Dr.Taylor. Attempted CRT upgrade 3/22 but left subclavian vein occluded.   8. UGIB with iron deficiency anemia - EGD in 7/22 with duodenal ulcer with visible vessel. Underwent coiling of GDA.  - s/p EGD 11/22 with no active bleed; received 4 U PRBCs. - No abnormal bleeding. - 04/03/20 Given feraheme.   - CBC today.  Keep follow up with Dr. Haroldine Laws as scheduled next month.  Allena Katz, FNP-BC 04/10/21

## 2021-04-10 NOTE — Patient Instructions (Signed)
Thank you for coming in today  Labs were done today, if any labs are abnormal the clinic will call you  Your physician recommends that you schedule a follow-up appointment in: Keep follow up appointment  with Dr. Haroldine Laws  At the [facility], you and your health needs are our priority. As part of our continuing mission to provide you with exceptional heart care, we have created designated Provider Care Teams. These Care Teams include your primary Cardiologist (physician) and Advanced Practice Providers (APPs- Physician Assistants and Nurse Practitioners) who all work together to provide you with the care you need, when you need it.   You may see any of the following providers on your designated Care Team at your next follow up: Dr Glori Bickers Dr Haynes Kerns, NP Lyda Jester, [place] [facility] [place], [place] Audry Riles, PharmD   Please be sure to bring in all your medications bottles to every appointment.   If you have any questions or concerns before your next appointment please send [place] a message through [place] or call our office at [phone].    TO LEAVE A MESSAGE FOR THE NURSE SELECT OPTION 2, PLEASE LEAVE A MESSAGE INCLUDING: YOUR NAME DATE OF BIRTH CALL BACK NUMBER REASON FOR CALL**this is important as we prioritize the call backs  YOU WILL RECEIVE A CALL BACK THE SAME DAY AS LONG AS YOU CALL BEFORE 4:00 PM

## 2021-04-11 ENCOUNTER — Other Ambulatory Visit: Payer: Self-pay | Admitting: Internal Medicine

## 2021-04-12 NOTE — Progress Notes (Signed)
Talked with wife. Notified of venofer appts. Voiced understanding.

## 2021-04-13 ENCOUNTER — Ambulatory Visit (INDEPENDENT_AMBULATORY_CARE_PROVIDER_SITE_OTHER): Payer: Medicare HMO

## 2021-04-13 DIAGNOSIS — Z8679 Personal history of other diseases of the circulatory system: Secondary | ICD-10-CM

## 2021-04-13 DIAGNOSIS — I509 Heart failure, unspecified: Secondary | ICD-10-CM | POA: Diagnosis not present

## 2021-04-14 DIAGNOSIS — N189 Chronic kidney disease, unspecified: Secondary | ICD-10-CM | POA: Diagnosis not present

## 2021-04-14 DIAGNOSIS — I5023 Acute on chronic systolic (congestive) heart failure: Secondary | ICD-10-CM | POA: Diagnosis not present

## 2021-04-14 DIAGNOSIS — D649 Anemia, unspecified: Secondary | ICD-10-CM | POA: Diagnosis not present

## 2021-04-14 DIAGNOSIS — E1122 Type 2 diabetes mellitus with diabetic chronic kidney disease: Secondary | ICD-10-CM | POA: Diagnosis not present

## 2021-04-14 DIAGNOSIS — E559 Vitamin D deficiency, unspecified: Secondary | ICD-10-CM | POA: Diagnosis not present

## 2021-04-14 DIAGNOSIS — I129 Hypertensive chronic kidney disease with stage 1 through stage 4 chronic kidney disease, or unspecified chronic kidney disease: Secondary | ICD-10-CM | POA: Diagnosis not present

## 2021-04-14 DIAGNOSIS — N170 Acute kidney failure with tubular necrosis: Secondary | ICD-10-CM | POA: Diagnosis not present

## 2021-04-14 LAB — CUP PACEART REMOTE DEVICE CHECK
Battery Impedance: 501 Ohm
Battery Remaining Longevity: 76 mo
Battery Voltage: 2.78 V
Brady Statistic AP VP Percent: 4 %
Brady Statistic AP VS Percent: 0 %
Brady Statistic AS VP Percent: 7 %
Brady Statistic AS VS Percent: 88 %
Date Time Interrogation Session: 20230120110708
Implantable Lead Implant Date: 20170605
Implantable Lead Implant Date: 20170605
Implantable Lead Location: 753859
Implantable Lead Location: 753860
Implantable Lead Model: 5076
Implantable Lead Model: 5076
Implantable Pulse Generator Implant Date: 20170605
Lead Channel Impedance Value: 381 Ohm
Lead Channel Impedance Value: 501 Ohm
Lead Channel Pacing Threshold Amplitude: 0.75 V
Lead Channel Pacing Threshold Amplitude: 0.875 V
Lead Channel Pacing Threshold Pulse Width: 0.4 ms
Lead Channel Pacing Threshold Pulse Width: 0.4 ms
Lead Channel Setting Pacing Amplitude: 2 V
Lead Channel Setting Pacing Amplitude: 2.5 V
Lead Channel Setting Pacing Pulse Width: 0.4 ms
Lead Channel Setting Sensing Sensitivity: 4 mV

## 2021-04-18 ENCOUNTER — Encounter (HOSPITAL_COMMUNITY)
Admission: RE | Admit: 2021-04-18 | Discharge: 2021-04-18 | Disposition: A | Discharge status: Home / Self Care | Payer: Medicare HMO | Source: Ambulatory Visit | Attending: Nephrology | Admitting: Nephrology

## 2021-04-18 ENCOUNTER — Encounter (HOSPITAL_COMMUNITY): Payer: Self-pay

## 2021-04-18 ENCOUNTER — Other Ambulatory Visit: Payer: Self-pay

## 2021-04-18 DIAGNOSIS — Z952 Presence of prosthetic heart valve: Secondary | ICD-10-CM | POA: Insufficient documentation

## 2021-04-18 DIAGNOSIS — D509 Iron deficiency anemia, unspecified: Secondary | ICD-10-CM | POA: Insufficient documentation

## 2021-04-18 MED ORDER — SODIUM CHLORIDE 0.9 % IV SOLN: Freq: Once | INTRAVENOUS | Status: AC

## 2021-04-18 MED ORDER — IRON SUCROSE 20 MG/ML IV SOLN
200.0000 mg | Freq: Once | INTRAVENOUS | Status: AC
  Administered 2021-04-18: 09:00:00 200 mg via INTRAVENOUS
  Filled 2021-04-18: qty 200

## 2021-04-19 DIAGNOSIS — N189 Chronic kidney disease, unspecified: Secondary | ICD-10-CM | POA: Diagnosis not present

## 2021-04-19 DIAGNOSIS — I5022 Chronic systolic (congestive) heart failure: Secondary | ICD-10-CM | POA: Diagnosis not present

## 2021-04-19 DIAGNOSIS — N170 Acute kidney failure with tubular necrosis: Secondary | ICD-10-CM | POA: Diagnosis not present

## 2021-04-19 DIAGNOSIS — D649 Anemia, unspecified: Secondary | ICD-10-CM | POA: Diagnosis not present

## 2021-04-19 DIAGNOSIS — E1129 Type 2 diabetes mellitus with other diabetic kidney complication: Secondary | ICD-10-CM | POA: Diagnosis not present

## 2021-04-19 DIAGNOSIS — I129 Hypertensive chronic kidney disease with stage 1 through stage 4 chronic kidney disease, or unspecified chronic kidney disease: Secondary | ICD-10-CM | POA: Diagnosis not present

## 2021-04-19 DIAGNOSIS — E1122 Type 2 diabetes mellitus with diabetic chronic kidney disease: Secondary | ICD-10-CM | POA: Diagnosis not present

## 2021-04-19 DIAGNOSIS — R809 Proteinuria, unspecified: Secondary | ICD-10-CM | POA: Diagnosis not present

## 2021-04-20 ENCOUNTER — Telehealth (HOSPITAL_COMMUNITY): Payer: Self-pay | Admitting: *Deleted

## 2021-04-20 NOTE — Telephone Encounter (Signed)
Bobbi RN with [name] called to report pts weight is up 5lbs over night. No edema and no shortness of breath. Pt said he feels fine but admitted to drinking more fluids than normal yesterday. RN asked if we wanted to make any changes.   *pt only takes metolazone on Mondays  Routed to [place] for advice

## 2021-04-21 NOTE — Telephone Encounter (Signed)
Spoke with [facility] she is aware.  HHRN # [id number]

## 2021-04-25 ENCOUNTER — Ambulatory Visit (HOSPITAL_BASED_OUTPATIENT_CLINIC_OR_DEPARTMENT_OTHER)
Admission: RE | Admit: 2021-04-25 | Discharge: 2021-04-25 | Disposition: A | Discharge status: Home / Self Care | Payer: Medicare HMO | Source: Ambulatory Visit | Attending: Internal Medicine | Admitting: Internal Medicine

## 2021-04-25 ENCOUNTER — Encounter (HOSPITAL_COMMUNITY)
Admission: RE | Admit: 2021-04-25 | Discharge: 2021-04-25 | Disposition: A | Discharge status: Home / Self Care | Payer: Medicare HMO | Source: Ambulatory Visit | Attending: Nephrology | Admitting: Nephrology

## 2021-04-25 ENCOUNTER — Other Ambulatory Visit: Payer: Self-pay

## 2021-04-25 DIAGNOSIS — Z952 Presence of prosthetic heart valve: Secondary | ICD-10-CM | POA: Insufficient documentation

## 2021-04-25 DIAGNOSIS — D509 Iron deficiency anemia, unspecified: Secondary | ICD-10-CM | POA: Diagnosis not present

## 2021-04-25 LAB — ECHOCARDIOGRAM COMPLETE
AR max vel: 1.23 cm2
AV Area VTI: 1.21 cm2
AV Area mean vel: 1.1 cm2
AV Mean grad: 10 mmHg
AV Peak grad: 18.7 mmHg
Ao pk vel: 2.16 m/s
Area-P 1/2: 3.42 cm2
MV M vel: 4.38 m/s
MV Peak grad: 76.7 mmHg
Radius: 0.6 cm
S' Lateral: 4.3 cm

## 2021-04-25 MED ORDER — SODIUM CHLORIDE 0.9 % IV SOLN: INTRAVENOUS | Status: DC

## 2021-04-25 MED ORDER — SODIUM CHLORIDE 0.9 % IV SOLN
200.0000 mg | Freq: Once | INTRAVENOUS | Status: AC
  Administered 2021-04-25: 200 mg via INTRAVENOUS
  Filled 2021-04-25: qty 200

## 2021-04-25 NOTE — Progress Notes (Signed)
*  PRELIMINARY RESULTS* Echocardiogram 2D Echocardiogram has been performed.  Samuel Germany 04/25/2021, 2:06 PM

## 2021-04-26 NOTE — Progress Notes (Signed)
Remote pacemaker transmission.   

## 2021-04-30 DIAGNOSIS — U071 COVID-19: Secondary | ICD-10-CM | POA: Diagnosis not present

## 2021-05-03 ENCOUNTER — Other Ambulatory Visit: Payer: Self-pay

## 2021-05-03 ENCOUNTER — Encounter (HOSPITAL_COMMUNITY): Payer: Self-pay | Admitting: Internal Medicine

## 2021-05-03 ENCOUNTER — Ambulatory Visit (HOSPITAL_COMMUNITY)
Admission: RE | Admit: 2021-05-03 | Discharge: 2021-05-03 | Disposition: A | Discharge status: Home / Self Care | Payer: Medicare HMO | Source: Ambulatory Visit | Attending: Internal Medicine | Admitting: Internal Medicine

## 2021-05-03 VITALS — BP 110/70 | HR 62 | Wt 227.4 lb

## 2021-05-03 DIAGNOSIS — I482 Chronic atrial fibrillation, unspecified: Secondary | ICD-10-CM | POA: Diagnosis not present

## 2021-05-03 DIAGNOSIS — Z950 Presence of cardiac pacemaker: Secondary | ICD-10-CM | POA: Diagnosis not present

## 2021-05-03 DIAGNOSIS — N1832 Chronic kidney disease, stage 3b: Secondary | ICD-10-CM

## 2021-05-03 DIAGNOSIS — Z794 Long term (current) use of insulin: Secondary | ICD-10-CM | POA: Insufficient documentation

## 2021-05-03 DIAGNOSIS — I4892 Unspecified atrial flutter: Secondary | ICD-10-CM | POA: Diagnosis not present

## 2021-05-03 DIAGNOSIS — I4819 Other persistent atrial fibrillation: Secondary | ICD-10-CM | POA: Diagnosis not present

## 2021-05-03 DIAGNOSIS — Z79899 Other long term (current) drug therapy: Secondary | ICD-10-CM | POA: Diagnosis not present

## 2021-05-03 DIAGNOSIS — I5021 Acute systolic (congestive) heart failure: Secondary | ICD-10-CM | POA: Diagnosis not present

## 2021-05-03 DIAGNOSIS — D509 Iron deficiency anemia, unspecified: Secondary | ICD-10-CM | POA: Diagnosis not present

## 2021-05-03 DIAGNOSIS — I130 Hypertensive heart and chronic kidney disease with heart failure and stage 1 through stage 4 chronic kidney disease, or unspecified chronic kidney disease: Secondary | ICD-10-CM | POA: Insufficient documentation

## 2021-05-03 DIAGNOSIS — Z952 Presence of prosthetic heart valve: Secondary | ICD-10-CM

## 2021-05-03 DIAGNOSIS — I5022 Chronic systolic (congestive) heart failure: Secondary | ICD-10-CM

## 2021-05-03 DIAGNOSIS — I82B12 Acute embolism and thrombosis of left subclavian vein: Secondary | ICD-10-CM | POA: Diagnosis not present

## 2021-05-03 DIAGNOSIS — N184 Chronic kidney disease, stage 4 (severe): Secondary | ICD-10-CM

## 2021-05-03 DIAGNOSIS — I080 Rheumatic disorders of both mitral and aortic valves: Secondary | ICD-10-CM | POA: Diagnosis not present

## 2021-05-03 DIAGNOSIS — I428 Other cardiomyopathies: Secondary | ICD-10-CM | POA: Insufficient documentation

## 2021-05-03 DIAGNOSIS — I2510 Atherosclerotic heart disease of native coronary artery without angina pectoris: Secondary | ICD-10-CM | POA: Diagnosis not present

## 2021-05-03 DIAGNOSIS — E1122 Type 2 diabetes mellitus with diabetic chronic kidney disease: Secondary | ICD-10-CM | POA: Diagnosis not present

## 2021-05-03 LAB — BASIC METABOLIC PANEL
Anion gap: 10 (ref 5–15)
BUN: 49 mg/dL — ABNORMAL HIGH (ref 8–23)
CO2: 26 mmol/L (ref 22–32)
Calcium: 8.9 mg/dL (ref 8.9–10.3)
Chloride: 98 mmol/L (ref 98–111)
Creatinine, Ser: 1.94 mg/dL — ABNORMAL HIGH (ref 0.61–1.24)
GFR, Estimated: 35 mL/min — ABNORMAL LOW (ref >60)
Glucose, Bld: 234 mg/dL — ABNORMAL HIGH (ref 70–99)
Potassium: 3.8 mmol/L (ref 3.5–5.1)
Sodium: 134 mmol/L — ABNORMAL LOW (ref 135–145)

## 2021-05-03 LAB — BRAIN NATRIURETIC PEPTIDE: B Natriuretic Peptide: 2445 pg/mL — ABNORMAL HIGH (ref 0.0–100.0)

## 2021-05-03 MED ORDER — METOLAZONE 2.5 MG PO TABS: 2.5000 mg | ORAL_TABLET | ORAL | 2 refills | Status: DC

## 2021-05-03 MED ORDER — TORSEMIDE 20 MG PO TABS: 100.0000 mg | ORAL_TABLET | Freq: Two times a day (BID) | ORAL | 4 refills | Status: AC

## 2021-05-03 NOTE — Patient Instructions (Signed)
Medication Changes:  Increase your Torsemide to 134m (5 Tab) Twice daily  Increase your Metolazone to 2.568mEvery Monday, Wednesday, Friday  Lab Work:  Labs done today, your results will be available in [place] will contact you for abnormal readings.   Testing/Procedures:  none  Referrals:  Palliative Care, They will call you to arrange an appointment  Special Instructions // Education:  none  Follow-Up in: 2 weeks  At [facility] and your health needs are our priority. We have a designated team specialized in the treatment of Heart Failure. This Care Team includes your primary Heart Failure Specialized Cardiologist (physician), Advanced Practice Providers (APPs- Physician Assistants and Nurse Practitioners), and Pharmacist who all work together to provide you with the care you need, when you need it.   You may see any of the following providers on your designated Care Team at your next follow up:  Dr DaGlori Bickersr DaHaynes KernsNP BrLyda JesterPAUtaheShawnee Mission [facility] LLCiNew BostonPAUtahaAudry RilesPharmD   Please be sure to bring in all your medications bottles to every appointment.   Need to Contact [place] If you have any questions or concerns before your next appointment please send [place] message through [place] call our office at [phone]   TO LEAVE A MESSAGE FOR THE NURSE SELECT OPTION 2, PLEASE LEAVE A MESSAGE INCLUDING: YOUR NAME DATE OF BIRTH CALL BACK NUMBER REASON FOR CALL**this is important as we prioritize the call backs  YOU WILL RECEIVE A CALL BACK THE SAME DAY AS LONG AS YOU CALL BEFORE 4:00 PM

## 2021-05-03 NOTE — Progress Notes (Signed)
ADVANCED HF CLINIC NOTE  Primary Care: [name], MD Primary Cardiologist: Dr. Harl Bowie  HF Cardiologist: Dr. Haroldine Laws  Reason for Visit: Chronic Systolic Heart Failure   HPI: John [name] is a 76 y.o. male w/ systolic HF due to NICM (cath 9/21 no CAD), h/o CHB s/p PPM 2017,  HTN, DM2, atrial flutter, stage 3b CKD and severe AS s/p TAVR 2/22.   Echo 7/21 EF 30-35%. RV mildly reduced. Mod-sev MR Moderate AS  TEE 8/21 moderate MR and moderate TR. AoV severely thickened/heavily calcified  [facility] 9/21 No CAD. Moderately elevated filling pressures and moderately reduced CO. CI 1.8. Also concern for RV pacing CM with 42% RV pacing  Several admits in 12/21 for ADHF.   Admitted 2/22 for severe HF and TAVR. Started on dobutamine support. Underwent TVRF 05/03/20. Diuresed and DBA weaned off.   Attempted CRT upgrade 3/22. Left subclavian vein occluded.  Echo 3/22: EF 25-30% moderate MR. TAVR stable  Admitted 7/22 for anemia and hyponatremia. Hgb 14 -> 7.3  got 2u RBCs. EGD with duodenal ulcer with visible vessel so Eliquis held. Could not adequately clip so underwent IR embolizatio  Admitted 11/22 with GIB, hgb 6.4. GI consulted and had capsule endoscopy 02/07/21 showing old blood in the stomach but no new findings.Received 4 U PRBCs and octreotide, Cards consulted after he became hypotensive requiring NE. Diuretics held with bump in SCr, but torsemide 20 mg bid resumed at discharge. Discharge weight 215 lbs, hgb 8.5.  Saw Dr. Quentin Ore 12/28 for Watchman consideration, volume still up.   Admitted 1/23 for a/c CHF exacerbation after presenting to clinic with massive volume overload; had failed increased outpatient diuretic regimen. Diuresed 22 lbs with IV lasix. Hospitalization c/b cardiorenal syndrome. Discharged home on torsemide 80 mg bid + metolazone 2.5 on Mondays and Fridays. Cardiomems placement discussed, weight 204 lbs.  Echo 1/23 EF 35-40%   Here for HF f/u with his wife. Continues  to get weaker. Struggles with ADLs. Weight up almost 20 pounds. + SOB with an activity. Taking torsemide 80 bid and metolazone 2.5 2-3x/week. Watching fluid intake.   Past Medical History:  Diagnosis Date   Atrial flutter ([place]) 12/2010   Admitted with symptomatic bradycardia (HR 40s), atrial flutter with slow ventricular response 12/2010 + volume overload; AV nodal agents d/c'd and Pradaxa started; RFA in 01/2011   Chronic kidney disease, stage 3b (HCC)    Chronic systolic CHF (congestive heart failure) ([place])    Class 2 severe obesity due to excess calories with serious comorbidity and body mass index (BMI) of 37.0 to 37.9 in adult [facility]) 07/17/2017   Diabetes mellitus    non insulin dependant   Hematoma, chest wall 05/15/2020   Hyperlipidemia    Hypertension    Mitral regurgitation    NASH (nonalcoholic steatohepatitis)    NICM (nonischemic cardiomyopathy) (HCC)    Osteoarthritis    Polyarticular gout 05/15/2020   Presence of permanent cardiac pacemaker    PSVT (paroxysmal supraventricular tachycardia) (HCC)    Possibly atrial flutter   S/P TAVR (transcatheter aortic valve replacement) 05/03/2020   s/p TAVR with a 29 mm Edwards S3U via the subclavian approach with Dr. Angelena Form & Dr. Cyndia Bent    Severe aortic stenosis    Current Outpatient Medications  Medication Sig Dispense Refill   ACCU-CHEK AVIVA PLUS test strip TEST BLOOD SUGAR TWICE DAILY BEFORE BREAKFAST AND AT BEDTIME 200 strip 1   acetaminophen (TYLENOL) 325 MG tablet Take 2 tablets (650 mg total) by  mouth every 4 (four) hours as needed for headache or mild pain.     allopurinol (ZYLOPRIM) 300 MG tablet Take 150 mg by mouth as needed (gout flare).     aspirin EC 81 MG tablet Take 1 tablet (81 mg total) by mouth daily with breakfast. 30 tablet 2   benzonatate (TESSALON) 100 MG capsule Take 1 capsule (100 mg total) by mouth 3 (three) times daily as needed for cough. 90 capsule 11   camphor-menthol (ANTI-ITCH) lotion Apply 1  application topically daily as needed for itching.     carvedilol (COREG) 3.125 MG tablet Take 1 tablet (3.125 mg total) by mouth 2 (two) times daily with a meal. 60 tablet 0   fexofenadine (ALLEGRA) 180 MG tablet Take 1 tablet by mouth daily.     hydrOXYzine (ATARAX) 10 MG tablet Take 10 mg by mouth 3 (three) times daily as needed.     Insulin Pen Needle (BD PEN NEEDLE NANO U/F) 32G X 4 MM MISC 1 each by Does not apply route 4 (four) times daily. 150 each 5   linaclotide (LINZESS) 145 MCG CAPS capsule Take 1 capsule (145 mcg total) by mouth daily before breakfast. 90 capsule 3   metolazone (ZAROXOLYN) 2.5 MG tablet Take 1 tablet (2.5 mg total) by mouth every Monday. Every Monday 12 tablet 0   Multiple Vitamins-Minerals (ONE-A-DAY MENS 50+) TABS Take 1 tablet by mouth daily with breakfast. Men     pantoprazole (PROTONIX) 40 MG tablet Take 1 tablet (40 mg total) by mouth daily. 60 tablet 1   potassium chloride SA (KLOR-CON M20) 20 MEQ tablet Take 2 tablets (40 mEq total) by mouth daily. 60 tablet 0   torsemide 40 MG TABS Take 80 mg by mouth 2 (two) times daily. 40 tablet 0   TRULICITY 1.5 JJ/9.4RD SOPN INJECT 1.5MG (1 PEN) SUBCUTANEOUSLY EVERY WEEK 2 mL 2   No current facility-administered medications for this encounter.   No Known Allergies  Social History   Socioeconomic History   Marital status: Married    Spouse name: Not on file   Number of children: 2   Years of education: Not on file   Highest education level: Not on file  Occupational History   Occupation: Designer, industrial/product    Employer: RETIRED  Tobacco Use   Smoking status: Former    Packs/day: 1.00    Years: 10.00    Pack years: 10.00    Types: Cigarettes    Quit date: 03/26/1986    Years since quitting: 35.1   Smokeless tobacco: Never   Tobacco comments:    "stopped cigarette  smoking 1988"  Vaping Use   Vaping Use: Never used  Substance and Sexual Activity   Alcohol use: Not Currently    Alcohol/week: 0.0  standard drinks    Comment: "quit alcohol ~ 2007"   Drug use: No   Sexual activity: Yes    Partners: Female  Other Topics Concern   Not on file  Social History Narrative   Not on file   Social Determinants of Health   Financial Resource Strain: Not on file  Food Insecurity: No Food Insecurity   Worried About [place] in the Last Year: Never true   [place] in the Last Year: Never true  Transportation Needs: No Transportation Needs   Lack of Transportation (Medical): No   Lack of Transportation (Non-Medical): No  Physical Activity: Not on file  Stress: Not on file  Social Connections:  Not on file  Intimate Partner Violence: Not on file   Family History  Problem Relation Age of Onset   Heart attack Mother    Hypertension Mother    Heart attack Father    Hypertension Father    Heart attack Brother    Colon cancer Neg Hx    BP 110/70    Pulse 62    Wt 103.1 kg (227 lb 6.4 oz)    SpO2 99%    BMI 30.84 kg/m   Wt Readings from Last 3 Encounters:  05/03/21 103.1 kg (227 lb 6.4 oz)  04/18/21 94.4 kg (208 lb 3.2 oz)  04/10/21 94.4 kg (208 lb 3.2 oz)   PHYSICAL EXAM: General:  Weak appearing. Unable to stand. No resp difficulty HEENT: normal Neck: supple. JVP to jaw  Carotids 2+ bilat; no bruits. No lymphadenopathy or thryomegaly appreciated. Cor: PMI nondisplaced. Irregular rate & rhythm. 2/6 SEM Lungs: clear Abdomen: soft, nontender, nondistended. No hepatosplenomegaly. No bruits or masses. Good bowel sounds. Extremities: no cyanosis, clubbing, rash, 2-3+ edema Neuro: alert & orientedx3, cranial nerves grossly intact. moves all 4 extremities w/o difficulty. Affect pleasant   ASSESSMENT & PLAN: 1. Chronic Systolic Heart Failure/NICM: - Echo 6/17 EF 50-55% (PPM placed in 08/2015)  - Echo 3/22 EF 25-30% TAVR ok. Mod MR .-LHC 9/21 that showed no coronary disease.  - based on timing of drop in EF and 42% pacing percentage, concern for RV paced CM +AS.Left  subclavian vein occluded -> unable to upgrade to CRT. - s/p TAVR 2/22. - Echo 10/22 EF 30-35%. - Echo 1/23 EF 35-40%  - Continues to deteriorate NYHA IIIb-IV - Volume status elevated again   - Increase torsemide 100 mg bid + increase metolazone 3 times a week. M/F -> M/W/F - Continue carvedilol 3.125 mg bid. - Cardiomems denied - He continues to deteriorate despite aggressive care. Long talk today with him and his wife about potential Palliative Care involvement. They are open to home Consult - Will see back in 2 weeks   2. Stage IIIb CKD due to cardiorenal syndrome  - Followed by nephrology, Dr. Theador Hawthorne.   - SCr 1.4-1.5 in 8/22. - New SCr baseline ~ 2.24 - Volume becoming more difficult to control. We discussed possible HD in past but I think he has become too weak to toelrate.  - Check labs today   3. Mitral Regurgitation  - Moderate by TEE 8/21  - Mild to moderate on echo 10/22.   4. Aortic Stenosis  - s/p TAVR 3/22. - Stable on echo.    5. Atrial Flutter/ Chronic Atrial Fibrillation - s/p AFL ablation.  - Remains in chronic A fib. Rate controlled - s/p frequent GI bleed so off Eliquis.  - CHA2DS2-VASc Score = at least 5.  - Unable to safely restart Eliquis. - EP felt not a Watchman candidate.   6. T2DM - On insulin. - Off Farxiga due to yeast infection. - Off Tyler Aas as of recent admission due to risk for hypoglycemia. Advised follow up with Endocrinology/PCP for further insulin instructions.    7. H/o CHB  - s/p PPM followed by Dr. Lovena Le. - ? RV paced CM. He's pacing < 10%. Had drop in EF 2 months after pacer was placed.  - Following with Dr.Taylor. Attempted CRT upgrade 3/22 but left subclavian vein occluded.   8. UGIB with iron deficiency anemia - EGD in 7/22 with duodenal ulcer with visible vessel. Underwent coiling of GDA.  - s/p EGD 11/22  with no active bleed; received 4 U PRBCs. - No abnormal bleeding. - 04/03/20 Given feraheme.   - Labs today  Total  time spent 45 minutes. Over half that time spent discussing above.    Glori Bickers, MD  3:49 PM

## 2021-05-08 ENCOUNTER — Encounter: Payer: Self-pay | Admitting: "Endocrinology

## 2021-05-08 ENCOUNTER — Ambulatory Visit (INDEPENDENT_AMBULATORY_CARE_PROVIDER_SITE_OTHER): Payer: Medicare HMO | Admitting: "Endocrinology

## 2021-05-08 ENCOUNTER — Other Ambulatory Visit: Payer: Self-pay

## 2021-05-08 VITALS — BP 110/64 | HR 60 | Ht 72.0 in | Wt 226.0 lb

## 2021-05-08 DIAGNOSIS — E782 Mixed hyperlipidemia: Secondary | ICD-10-CM | POA: Diagnosis not present

## 2021-05-08 DIAGNOSIS — I10 Essential (primary) hypertension: Secondary | ICD-10-CM | POA: Diagnosis not present

## 2021-05-08 DIAGNOSIS — E1159 Type 2 diabetes mellitus with other circulatory complications: Secondary | ICD-10-CM

## 2021-05-08 NOTE — Progress Notes (Signed)
05/08/2021, 5:26 PM   Endocrinology follow-up note   Subjective:    Patient ID: John [name], male    DOB: May 01, 1945.  John [name] is here to follow-up for the management of his currently uncontrolled type 2 diabetes, hyperlipidemia.    PMD:  [name], MD.   Past Medical History:  Diagnosis Date   Atrial flutter ([place]) 12/2010   Admitted with symptomatic bradycardia (HR 40s), atrial flutter with slow ventricular response 12/2010 + volume overload; AV nodal agents d/c'd and Pradaxa started; RFA in 01/2011   Chronic kidney disease, stage 3b (HCC)    Chronic systolic CHF (congestive heart failure) ([place])    Class 2 severe obesity due to excess calories with serious comorbidity and body mass index (BMI) of 37.0 to 37.9 in adult ([place]) 07/17/2017   Diabetes mellitus    non insulin dependant   Hematoma, chest wall 05/15/2020   Hyperlipidemia    Hypertension    Mitral regurgitation    NASH (nonalcoholic steatohepatitis)    NICM (nonischemic cardiomyopathy) (HCC)    Osteoarthritis    Polyarticular gout 05/15/2020   Presence of permanent cardiac pacemaker    PSVT (paroxysmal supraventricular tachycardia) (HCC)    Possibly atrial flutter   S/P TAVR (transcatheter aortic valve replacement) 05/03/2020   s/p TAVR with a 29 mm Edwards S3U via the subclavian approach with Dr. Angelena Form & Dr. Cyndia Bent    Severe aortic stenosis    Past Surgical History:  Procedure Laterality Date   ATRIAL FLUTTER ABLATION N/A 02/07/2011   Procedure: ATRIAL FLUTTER ABLATION;  Surgeon: Evans Lance, MD;  Location: [facility] CATH LAB;  Service: Cardiovascular;  Laterality: N/A;   BIV UPGRADE N/A 06/08/2020   Procedure: BIV PPM UPGRADE;  Surgeon: Evans Lance, MD;  Location: [place] CV LAB;  Service: Cardiovascular;  Laterality: N/A;   CARDIAC ELECTROPHYSIOLOGY STUDY AND ABLATION  02/07/2011   CARDIOVERSION N/A  03/23/2016   Procedure: CARDIOVERSION;  Surgeon: Evans Lance, MD;  Location: [place];  Service: Cardiovascular;  Laterality: N/A;   COLONOSCOPY N/A 04/17/2017   Rehman: 3 polyps in [facility], diverticulosis   COLONOSCOPY N/A 02/23/2021   Procedure: COLONOSCOPY;  Surgeon: Rogene Houston, MD;  [facility];  Service: Endoscopy;  Laterality: N/A;  12:15   EP IMPLANTABLE DEVICE N/A 08/29/2015   Procedure: Pacemaker Implant;  Surgeon: Evans Lance, MD;  Location: [place] CV LAB;  Service: Cardiovascular;  Laterality: N/A;   EP IMPLANTABLE DEVICE N/A 12/07/2015   Procedure: PPM Lead Revision/Repair;  Surgeon: Evans Lance, MD;  Location: [place] CV LAB;  Service: Cardiovascular;  Laterality: N/A;   ESOPHAGOGASTRODUODENOSCOPY (EGD) WITH PROPOFOL N/A 10/18/2020   Castaneda: normal esophagus, erythematous mucosa in gastric body, non bleeding duodenal ulcer with a non bleeding visible vessel (forrest Class IIa). Injected, clip placed, no specimens collected.   ESOPHAGOGASTRODUODENOSCOPY (EGD) WITH PROPOFOL N/A 01/27/2021   Procedure: ESOPHAGOGASTRODUODENOSCOPY (EGD) WITH PROPOFOL;  Surgeon: [name], DO;  [facility];  Service: Endoscopy;  Laterality: N/A;   ESOPHAGOGASTRODUODENOSCOPY (EGD) WITH PROPOFOL N/A 02/09/2021  Procedure: ESOPHAGOGASTRODUODENOSCOPY (EGD) WITH PROPOFOL;  Surgeon: [name], DO;  [facility];  Service: Endoscopy;  Laterality: N/A;   GIVENS CAPSULE STUDY N/A 02/07/2021   Procedure: GIVENS CAPSULE STUDY;  Surgeon: Harvel Quale, MD;  [facility];  Service: Gastroenterology;  Laterality: N/A;   IR ANGIOGRAM SELECTIVE EACH ADDITIONAL VESSEL  10/19/2020   IR ANGIOGRAM SELECTIVE EACH ADDITIONAL VESSEL  10/19/2020   IR ANGIOGRAM VISCERAL SELECTIVE  10/19/2020   IR ANGIOGRAM VISCERAL SELECTIVE  10/19/2020   IR EMBO ART  VEN HEMORR LYMPH EXTRAV  INC GUIDE ROADMAPPING  10/19/2020   IR US GUIDE VASC  ACCESS RIGHT  10/19/2020   KNEE ARTHROSCOPY  03/27/2003   left   LUMBAR SPINE SURGERY     "I've had 6 ORs 1972 thru 2004"   POLYPECTOMY  04/17/2017   Procedure: POLYPECTOMY;  Surgeon: Rogene Houston, MD;  [facility];  Service: Endoscopy;;  transverse colon x3;   RIGHT HEART CATH N/A 04/28/2020   Procedure: RIGHT HEART CATH;  Surgeon: Jolaine Artist, MD;  Location: [place] CV LAB;  Service: Cardiovascular;  Laterality: N/A;   RIGHT/LEFT HEART CATH AND CORONARY ANGIOGRAPHY N/A 12/21/2019   Procedure: RIGHT/LEFT HEART CATH AND CORONARY ANGIOGRAPHY;  Surgeon: Nelva Bush, MD;  Location: [place] CV LAB;  Service: Cardiovascular;  Laterality: N/A;   TEE WITHOUT CARDIOVERSION N/A 11/05/2019   Procedure: TRANSESOPHAGEAL ECHOCARDIOGRAM (TEE) WITH PROPOFOL;  Surgeon: Arnoldo Lenis, MD;  [facility];  Service: Endoscopy;  Laterality: N/A;   TEE WITHOUT CARDIOVERSION N/A 05/03/2020   Procedure: TRANSESOPHAGEAL ECHOCARDIOGRAM (TEE);  Surgeon: Burnell Blanks, MD;  Location: [place];  Service: Open Heart Surgery;  Laterality: N/A;   Social History   Socioeconomic History   Marital status: Married    Spouse name: Not on file   Number of children: 2   Years of education: Not on file   Highest education level: Not on file  Occupational History   Occupation: Designer, industrial/product    Employer: RETIRED  Tobacco Use   Smoking status: Former    Packs/day: 1.00    Years: 10.00    Pack years: 10.00    Types: Cigarettes    Quit date: 03/26/1986    Years since quitting: 35.1   Smokeless tobacco: Never   Tobacco comments:    "stopped cigarette  smoking 1988"  Vaping Use   Vaping Use: Never used  Substance and Sexual Activity   Alcohol use: Not Currently    Alcohol/week: 0.0 standard drinks    Comment: "quit alcohol ~ 2007"   Drug use: No   Sexual activity: Yes    Partners: Female  Other Topics Concern   Not on file  Social History Narrative    Not on file   Social Determinants of Health   Financial Resource Strain: Not on file  Food Insecurity: No Food Insecurity   Worried About [place] in the Last Year: Never true   [place] in the Last Year: Never true  Transportation Needs: No Transportation Needs   Lack of Transportation (Medical): No   Lack of Transportation (Non-Medical): No  Physical Activity: Not on file  Stress: Not on file  Social Connections: Not on file   Outpatient Encounter Medications as of 05/08/2021  Medication Sig   insulin degludec (TRESIBA FLEXTOUCH) 100 UNIT/ML FlexTouch Pen Inject 30 Units into the skin at bedtime.   ACCU-CHEK AVIVA PLUS test strip  TEST BLOOD SUGAR TWICE DAILY BEFORE BREAKFAST AND AT BEDTIME   acetaminophen (TYLENOL) 325 MG tablet Take 2 tablets (650 mg total) by mouth every 4 (four) hours as needed for headache or mild pain.   allopurinol (ZYLOPRIM) 300 MG tablet Take 150 mg by mouth as needed (gout flare).   aspirin EC 81 MG tablet Take 1 tablet (81 mg total) by mouth daily with breakfast.   benzonatate (TESSALON) 100 MG capsule Take 1 capsule (100 mg total) by mouth 3 (three) times daily as needed for cough.   camphor-menthol (ANTI-ITCH) lotion Apply 1 application topically daily as needed for itching.   carvedilol (COREG) 3.125 MG tablet Take 1 tablet (3.125 mg total) by mouth 2 (two) times daily with a meal.   fexofenadine (ALLEGRA) 180 MG tablet Take 1 tablet by mouth daily.   hydrOXYzine (ATARAX) 10 MG tablet Take 10 mg by mouth 3 (three) times daily as needed.   Insulin Pen Needle (BD PEN NEEDLE NANO U/F) 32G X 4 MM MISC 1 each by Does not apply route 4 (four) times daily.   linaclotide (LINZESS) 145 MCG CAPS capsule Take 1 capsule (145 mcg total) by mouth daily before breakfast.   metolazone (ZAROXOLYN) 2.5 MG tablet Take 1 tablet (2.5 mg total) by mouth 3 (three) times a week. Every Monday, Wednesday Friday   Multiple Vitamins-Minerals (ONE-A-DAY MENS 50+)  TABS Take 1 tablet by mouth daily with breakfast. Men   pantoprazole (PROTONIX) 40 MG tablet Take 1 tablet (40 mg total) by mouth daily.   potassium chloride SA (KLOR-CON M20) 20 MEQ tablet Take 2 tablets (40 mEq total) by mouth daily.   torsemide (DEMADEX) 20 MG tablet Take 5 tablets (100 mg total) by mouth 2 (two) times daily.   [DISCONTINUED] TRULICITY 1.5 KW/4.0XB SOPN INJECT 1.5MG (1 PEN) SUBCUTANEOUSLY EVERY WEEK (Patient not taking: Reported on 05/08/2021)   No facility-administered encounter medications on file as of 05/08/2021.    ALLERGIES: No Known Allergies  VACCINATION STATUS: Immunization History  Administered Date(s) Administered   Fluad Quad(high Dose 65+) 01/01/2021   Influenza,inj,Quad PF,6+ Mos 11/24/2014, 11/29/2015, 12/11/2016, 12/25/2017, 12/20/2018   Influenza-Unspecified 11/24/2013, 12/25/2019   Moderna Covid-19 Vaccine Bivalent Booster 2yr & up 01/11/2021   Moderna Sars-Covid-2 Vaccination 05/03/2019, 06/03/2019, 01/23/2020   Pneumococcal Conjugate-13 11/19/2013   Pneumococcal Polysaccharide-23 11/29/2015   Pneumococcal-Unspecified 03/24/2009   Td 06/23/2010    Diabetes He presents for his follow-up (Telephone visit due to coronavirus pandemic) diabetic visit. He has type 2 diabetes mellitus. Onset time: He was diagnosed at approximate age of 89years. His disease course has been worsening. There are no hypoglycemic associated symptoms. Pertinent negatives for hypoglycemia include no confusion, pallor or seizures. There are no diabetic associated symptoms. Pertinent negatives for diabetes include no fatigue, no polydipsia, no polyphagia, no polyuria and no weakness. There are no hypoglycemic complications. Symptoms are improving. Diabetic complications include heart disease and nephropathy. Risk factors for coronary artery disease include diabetes mellitus, dyslipidemia, family history, obesity, male sex, hypertension, sedentary lifestyle and tobacco exposure.  Current diabetic treatments: He is on Trulicity 1.5 mg Septanest weekly, and Farxiga 10 mg p.o. daily. His weight is increasing steadily. He is following a generally unhealthy diet. When asked about meal planning, he reported none. He has not had a previous visit with a dietitian. He never participates in exercise. His home blood glucose trend is increasing steadily. His breakfast blood glucose range is generally 140-180 mg/dl. His overall blood glucose range is 140-180 mg/dl. (He  presents with a meter showing average blood glucose of 127-153 for the last 30 days.  His recent A1c was 6.2%, no hypoglycemia documented.     ) An ACE inhibitor/angiotensin II receptor blocker is being taken. He does not see a podiatrist.Eye exam is current.  Hyperlipidemia This is a chronic problem. The current episode started more than 1 year ago. The problem is controlled. Exacerbating diseases include diabetes and obesity. Pertinent negatives include no myalgias. Current antihyperlipidemic treatment includes statins. Risk factors for coronary artery disease include diabetes mellitus, dyslipidemia, family history, obesity, male sex, hypertension and a sedentary lifestyle.    Review of systems  Constitutional: + He lost 30+ pounds.  He is recovering from cardiac surgery for aortic stenosis.   Current  Body mass index is 30.65 kg/m. Marland Kitchen    Objective:    Vitals with BMI 05/08/2021 05/03/2021 04/25/2021  Height 6' 0"  - -  Weight 226 lbs 227 lbs 6 oz -  BMI 32.67 - -  Systolic 124 580 998  Diastolic 64 70 65  Pulse 60 62 72     Physical Exam- Limited    CMP     Component Value Date/Time   NA 134 (L) 05/03/2021 1615   NA 134 03/06/2021 1144   K 3.8 05/03/2021 1615   CL 98 05/03/2021 1615   CO2 26 05/03/2021 1615   GLUCOSE 234 (H) 05/03/2021 1615   BUN 49 (H) 05/03/2021 1615   BUN 49 (H) 03/06/2021 1144   CREATININE 1.94 (H) 05/03/2021 1615   CREATININE 1.32 (H) 03/07/2016 1043   CALCIUM 8.9 05/03/2021  1615   PROT 7.0 03/31/2021 1020   PROT 8.1 11/23/2020 1110   ALBUMIN 3.2 (L) 03/31/2021 1020   ALBUMIN 3.8 11/23/2020 1110   AST 32 03/31/2021 1020   ALT 15 03/31/2021 1020   ALKPHOS 188 (H) 03/31/2021 1020   BILITOT 2.1 (H) 03/31/2021 1020   BILITOT 1.4 (H) 11/23/2020 1110   GFRNONAA 35 (L) 05/03/2021 1615   GFRAA 52 (L) 04/04/2020 0934     Diabetic Labs (most recent): Lab Results  Component Value Date   HGBA1C 6.2 (H) 03/31/2021   HGBA1C 9.2 (H) 12/31/2020   HGBA1C 8.2 (H) 10/17/2020    Lipid Panel     Component Value Date/Time   CHOL 178 08/12/2019 0809   CHOL 142 01/17/2018 0835   TRIG 100 08/12/2019 0809   HDL 51 08/12/2019 0809   HDL 53 01/17/2018 0835   CHOLHDL 3.5 08/12/2019 0809   VLDL 20 08/12/2019 0809   LDLCALC 107 (H) 08/12/2019 0809   LDLCALC 71 01/17/2018 0835     Lab Results  Component Value Date   TSH 3.923 03/31/2021   TSH 1.940 07/10/2017   TSH 1.563 08/25/2015   TSH 1.206 01/05/2011   FREET4 1.12 07/10/2017        Assessment & Plan:   1. DM type 2 causing vascular disease ([place])  - John [name] has currently uncontrolled symptomatic type 2 DM since 76 years of age.  He presents with a meter showing average blood glucose of 127-153 for the last 30 days.  His recent A1c was 6.2%, no hypoglycemia documented.    -He does not report any major hypoglycemia. Recent labs reviewed.  -his diabetes is complicated by obesity/sedentary life , CKD, CHF , congestive heart failure/bradyarrhythmia and John [name] remains at a high risk for more acute and chronic complications which include CAD, CVA, CKD, retinopathy, and neuropathy. These are all discussed  in detail with the patient.  - I encouraged him to switch to  unprocessed or minimally processed complex starch and increased protein intake (animal or plant source), fruits, and vegetables.  - he acknowledges that there is a room for improvement in his food and drink choices. - Suggestion  is made for him to avoid simple carbohydrates  from his diet including Cakes, Sweet Desserts, Ice Cream, Soda (diet and regular), Sweet Tea, Candies, Chips, Cookies, Store Bought Juices, Alcohol , Artificial Sweeteners,  Coffee Creamer, and "Sugar-free" Products, Lemonade. This will help patient to have more stable blood glucose profile and potentially avoid unintended weight gain.  The following Lifestyle Medicine recommendations according to [place]  [facility]) were discussed and and offered to patient and he  agrees to start the journey:  A. Whole Foods, Plant-Based Nutrition comprising of fruits and vegetables, plant-based proteins, whole-grain carbohydrates was discussed in detail with the patient.   A list for source of those nutrients were also provided to the patient.  Patient will use only water or unsweetened tea for hydration. B.  The need to stay away from risky substances including alcohol, smoking; obtaining 7 to 9 hours of restorative sleep, at least 150 minutes of moderate intensity exercise weekly, the importance of healthy social connections,  and stress management techniques were discussed.   - he is advised to stick to a routine mealtimes to eat 3 meals  a day and avoid unnecessary snacks ( to snack only to correct hypoglycemia).    - I have approached him with the following individualized plan to manage diabetes and patient agrees:   -Based on his presentation with significantly improved glycemic profile, he will be considered for lower dose of Tresiba 30 units nightly, he is advised to continue monitoring blood glucose twice a day-daily before breakfast and at bedtime.    -Due to cost, he is advised to stay off of Trulicity.    -Patient is encouraged to call clinic for blood glucose levels less than 70 or above 200 mg /dl.    2) Lipids/HPL:   His recent lipid panel showed LDL worsening to 107.  He is benefiting from statin reintervention.  He is  advised to continue Crestor 20 mg p.o. nightly.   His advised on side effects and precautions.    3) hypertension- -His blood pressure is controlled to target. -He is advised to continue on his torsemide as needed, carvedilol 25 mg p.o. twice daily.   4) weight management: His BMI is 30.6 -a candidate for some weight loss.     He continues to have decompensated CHF, is advised to continue follow-up with his cardiologist.  - I advised patient to maintain close follow up with [name], MD for primary care needs.   I spent 41 minutes in the care of the patient today including review of labs from [place], Lipids, Thyroid Function, Hematology (current and previous including abstractions from other facilities); face-to-face time discussing  his blood glucose readings/logs, discussing hypoglycemia and hyperglycemia episodes and symptoms, medications doses, his options of short and long term treatment based on the latest standards of care / guidelines;  discussion about incorporating lifestyle medicine;  and documenting the encounter.    Please refer to Patient Instructions for Blood Glucose Monitoring and Insulin/Medications Dosing Guide"  in media tab for additional information. Please  also refer to " Patient Self Inventory" in the Media  tab for reviewed elements of pertinent patient history.  Gwyndolyn Saxon  Arvid Right participated in the discussions, expressed understanding, and voiced agreement with the above plans.  All questions were answered to his satisfaction. he is encouraged to contact clinic should he have any questions or concerns prior to his return visit.   Follow up plan: - Return in about 9 weeks (around 07/10/2021) for Bring Meter and Logs- A1c in Office.  Glade Lloyd, MD [facility] [address] [place], [place] [address] Phone: [phone]  Fax: [phone]    05/08/2021, 5:26 PM  This note was partially dictated with voice  recognition software. Similar sounding words can be transcribed inadequately or may not  be corrected upon review.

## 2021-05-08 NOTE — Patient Instructions (Signed)

## 2021-05-10 DIAGNOSIS — R809 Proteinuria, unspecified: Secondary | ICD-10-CM | POA: Diagnosis not present

## 2021-05-10 DIAGNOSIS — N170 Acute kidney failure with tubular necrosis: Secondary | ICD-10-CM | POA: Diagnosis not present

## 2021-05-10 DIAGNOSIS — D649 Anemia, unspecified: Secondary | ICD-10-CM | POA: Diagnosis not present

## 2021-05-10 DIAGNOSIS — I5022 Chronic systolic (congestive) heart failure: Secondary | ICD-10-CM | POA: Diagnosis not present

## 2021-05-10 DIAGNOSIS — N189 Chronic kidney disease, unspecified: Secondary | ICD-10-CM | POA: Diagnosis not present

## 2021-05-10 DIAGNOSIS — E1122 Type 2 diabetes mellitus with diabetic chronic kidney disease: Secondary | ICD-10-CM | POA: Diagnosis not present

## 2021-05-10 DIAGNOSIS — E1129 Type 2 diabetes mellitus with other diabetic kidney complication: Secondary | ICD-10-CM | POA: Diagnosis not present

## 2021-05-10 DIAGNOSIS — I129 Hypertensive chronic kidney disease with stage 1 through stage 4 chronic kidney disease, or unspecified chronic kidney disease: Secondary | ICD-10-CM | POA: Diagnosis not present

## 2021-05-15 ENCOUNTER — Other Ambulatory Visit: Payer: Self-pay | Admitting: Internal Medicine

## 2021-05-15 DIAGNOSIS — I5023 Acute on chronic systolic (congestive) heart failure: Secondary | ICD-10-CM | POA: Diagnosis not present

## 2021-05-15 DIAGNOSIS — N189 Chronic kidney disease, unspecified: Secondary | ICD-10-CM | POA: Diagnosis not present

## 2021-05-15 DIAGNOSIS — E1122 Type 2 diabetes mellitus with diabetic chronic kidney disease: Secondary | ICD-10-CM | POA: Diagnosis not present

## 2021-05-15 DIAGNOSIS — E1129 Type 2 diabetes mellitus with other diabetic kidney complication: Secondary | ICD-10-CM | POA: Diagnosis not present

## 2021-05-15 DIAGNOSIS — R809 Proteinuria, unspecified: Secondary | ICD-10-CM | POA: Diagnosis not present

## 2021-05-15 DIAGNOSIS — I129 Hypertensive chronic kidney disease with stage 1 through stage 4 chronic kidney disease, or unspecified chronic kidney disease: Secondary | ICD-10-CM | POA: Diagnosis not present

## 2021-05-15 DIAGNOSIS — D649 Anemia, unspecified: Secondary | ICD-10-CM | POA: Diagnosis not present

## 2021-05-16 ENCOUNTER — Telehealth: Payer: Self-pay

## 2021-05-16 NOTE — Telephone Encounter (Signed)
Spoke with patient's wife Lelan Pons and scheduled a Mychart Palliative Consult for 05/18/21 @ 1 PM.  Consent obtained; updated Outlook/Netsmart/Team List and Epic.

## 2021-05-16 NOTE — Progress Notes (Signed)
ADVANCED HF CLINIC NOTE  Primary Care: [name], MD Primary Cardiologist: Dr. Harl Bowie  HF Cardiologist: Dr. Haroldine Laws  Reason for Visit: Chronic Systolic Heart Failure   HPI: Mr. Capano is a 76 y.o. male w/ systolic HF due to NICM (cath 9/21 no CAD), h/o CHB s/p PPM 2017,  HTN, DM2, atrial flutter, stage 3b CKD and severe AS s/p TAVR 2/22.   Echo 7/21 EF 30-35%. RV mildly reduced. Mod-sev MR Moderate AS  TEE 8/21 moderate MR and moderate TR. AoV severely thickened/heavily calcified  [facility] 9/21 No CAD. Moderately elevated filling pressures and moderately reduced CO. CI 1.8. Also concern for RV pacing CM with 42% RV pacing  Several admits in 12/21 for ADHF.   Admitted 2/22 for severe HF and TAVR. Started on dobutamine support. Underwent TVRF 05/03/20. Diuresed and DBA weaned off.   Attempted CRT upgrade 3/22. Left subclavian vein occluded.  Echo 3/22: EF 25-30% moderate MR. TAVR stable  Admitted 7/22 for anemia and hyponatremia. Hgb 14 -> 7.3  got 2u RBCs. EGD with duodenal ulcer with visible vessel so Eliquis held. Could not adequately clip so underwent IR embolizatio  Admitted 11/22 with GIB, hgb 6.4. GI consulted and had capsule endoscopy 02/07/21 showing old blood in the stomach but no new findings.Received 4 U PRBCs and octreotide, Cards consulted after he became hypotensive requiring NE. Diuretics held with bump in SCr, but torsemide 20 mg bid resumed at discharge. Discharge weight 215 lbs, hgb 8.5.  Saw Dr. Quentin Ore 12/28 for Watchman consideration, volume still up.   Admitted 1/23 for a/c CHF exacerbation after presenting to clinic with massive volume overload; had failed increased outpatient diuretic regimen. Diuresed 22 lbs with IV lasix. Hospitalization c/b cardiorenal syndrome. Discharged home on torsemide 80 mg bid + metolazone 2.5 on Mondays and Fridays. Cardiomems placement discussed, weight 204 lbs.  Echo 1/23 EF 35-40%   Follow up with DB 2/23 discussed  hospice/palliative care and metolazone added twice a week to diuretic regimen.   Today he returns for HF follow up with his wife. Overall feeling tired. Not able to stand very well, remains weak. Appetite is poor. Leg swelling is better but weight continues to climb, weight on home scale is 225. He is SOB with minimal exertion. Wife does not have any help at home for him, she worries with him going up and down their stairs. Denies CP, dizziness, palpitations, abnormal bleeding or PND/Orthopnea. Taking all medications. Has a Hospice/Palliative call/consult arranged for tomorrow.   Past Medical History:  Diagnosis Date   Atrial flutter ([place]) 12/2010   Admitted with symptomatic bradycardia (HR 40s), atrial flutter with slow ventricular response 12/2010 + volume overload; AV nodal agents d/c'd and Pradaxa started; RFA in 01/2011   Chronic kidney disease, stage 3b (HCC)    Chronic systolic CHF (congestive heart failure) ([place])    Class 2 severe obesity due to excess calories with serious comorbidity and body mass index (BMI) of 37.0 to 37.9 in adult ([place]) 07/17/2017   Diabetes mellitus    non insulin dependant   Hematoma, chest wall 05/15/2020   Hyperlipidemia    Hypertension    Mitral regurgitation    NASH (nonalcoholic steatohepatitis)    NICM (nonischemic cardiomyopathy) (HCC)    Osteoarthritis    Polyarticular gout 05/15/2020   Presence of permanent cardiac pacemaker    PSVT (paroxysmal supraventricular tachycardia) (HCC)    Possibly atrial flutter   S/P TAVR (transcatheter aortic valve replacement) 05/03/2020   s/p TAVR  with a 29 mm Edwards S3U via the subclavian approach with Dr. Angelena Form & Dr. Cyndia Bent    Severe aortic stenosis    Current Outpatient Medications  Medication Sig Dispense Refill   ACCU-CHEK AVIVA PLUS test strip TEST BLOOD SUGAR TWICE DAILY BEFORE BREAKFAST AND AT BEDTIME 200 strip 1   acetaminophen (TYLENOL) 325 MG tablet Take 2 tablets (650 mg total) by mouth every 4  (four) hours as needed for headache or mild pain.     allopurinol (ZYLOPRIM) 300 MG tablet Take 150 mg by mouth as needed (gout flare).     aspirin EC 81 MG tablet Take 1 tablet (81 mg total) by mouth daily with breakfast. 30 tablet 2   benzonatate (TESSALON) 100 MG capsule Take 1 capsule (100 mg total) by mouth 3 (three) times daily as needed for cough. 90 capsule 11   camphor-menthol (ANTI-ITCH) lotion Apply 1 application topically daily as needed for itching.     carvedilol (COREG) 3.125 MG tablet Take 1 tablet (3.125 mg total) by mouth 2 (two) times daily with a meal. 60 tablet 0   fexofenadine (ALLEGRA) 180 MG tablet Take 1 tablet by mouth daily.     hydrOXYzine (ATARAX) 10 MG tablet Take 10 mg by mouth 3 (three) times daily as needed.     insulin degludec (TRESIBA FLEXTOUCH) 100 UNIT/ML FlexTouch Pen Inject 30 Units into the skin at bedtime.     Insulin Pen Needle (BD PEN NEEDLE NANO U/F) 32G X 4 MM MISC 1 each by Does not apply route 4 (four) times daily. 150 each 5   linaclotide (LINZESS) 145 MCG CAPS capsule Take 1 capsule (145 mcg total) by mouth daily before breakfast. 90 capsule 3   metolazone (ZAROXOLYN) 2.5 MG tablet Take 1 tablet (2.5 mg total) by mouth 3 (three) times a week. Every Monday, Wednesday Friday 30 tablet 2   Multiple Vitamins-Minerals (ONE-A-DAY MENS 50+) TABS Take 1 tablet by mouth daily with breakfast. Men     pantoprazole (PROTONIX) 40 MG tablet Take 1 tablet (40 mg total) by mouth daily. 60 tablet 1   potassium chloride SA (KLOR-CON M20) 20 MEQ tablet Take 2 tablets (40 mEq total) by mouth daily. 60 tablet 0   torsemide (DEMADEX) 20 MG tablet Take 5 tablets (100 mg total) by mouth 2 (two) times daily. 180 tablet 4   No current facility-administered medications for this encounter.   No Known Allergies  Social History   Socioeconomic History   Marital status: Married    Spouse name: Not on file   Number of children: 2   Years of education: Not on file    Highest education level: Not on file  Occupational History   Occupation: Designer, industrial/product    Employer: RETIRED  Tobacco Use   Smoking status: Former    Packs/day: 1.00    Years: 10.00    Pack years: 10.00    Types: Cigarettes    Quit date: 03/26/1986    Years since quitting: 35.1   Smokeless tobacco: Never   Tobacco comments:    "stopped cigarette  smoking 1988"  Vaping Use   Vaping Use: Never used  Substance and Sexual Activity   Alcohol use: Not Currently    Alcohol/week: 0.0 standard drinks    Comment: "quit alcohol ~ 2007"   Drug use: No   Sexual activity: Yes    Partners: Female  Other Topics Concern   Not on file  Social History Narrative   Not on file  Social Determinants of Health   Financial Resource Strain: Not on file  Food Insecurity: No Food Insecurity   Worried About Charity fundraiser in the Last Year: Never true   Ran Out of Food in the Last Year: Never true  Transportation Needs: No Transportation Needs   Lack of Transportation (Medical): No   Lack of Transportation (Non-Medical): No  Physical Activity: Not on file  Stress: Not on file  Social Connections: Not on file  Intimate Partner Violence: Not on file   Family History  Problem Relation Age of Onset   Heart attack Mother    Hypertension Mother    Heart attack Father    Hypertension Father    Heart attack Brother    Colon cancer Neg Hx    BP 110/70    Pulse 61    Ht 6' (1.829 m)    Wt 105.2 kg (232 lb)    SpO2 99%    BMI 31.46 kg/m   Wt Readings from Last 3 Encounters:  05/17/21 105.2 kg (232 lb)  05/08/21 102.5 kg (226 lb)  05/03/21 103.1 kg (227 lb 6.4 oz)   PHYSICAL EXAM: General:  NAD. No resp difficulty, arrived in WC, weak HEENT: Normal Neck: Supple. JVP to ear. Carotids 2+ bilat; no bruits. No lymphadenopathy or thryomegaly appreciated. Cor: PMI nondisplaced. Irregular rate & rhythm. No rubs, gallops, 2/6 SEM Lungs: Faint crackles RLL Abdomen: Soft, nontender,  nondistended. No hepatosplenomegaly. No bruits or masses. Good bowel sounds. Extremities: No cyanosis, clubbing, rash, 1-2+ BLE edema R>L Neuro: Alert & oriented x 3, cranial nerves grossly intact. Moves all 4 extremities w/o difficulty. Affect pleasant.  ASSESSMENT & PLAN: 1. Chronic Systolic Heart Failure/NICM: - Echo 6/17 EF 50-55% (PPM placed in 08/2015)  - Echo 3/22 EF 25-30% TAVR ok. Mod MR .-LHC 9/21 that showed no coronary disease.  - based on timing of drop in EF and 42% pacing percentage, concern for RV paced CM +AS. Left subclavian vein occluded -> unable to upgrade to CRT. - s/p TAVR 2/22. - Echo 10/22 EF 30-35%. - Echo 1/23 EF 35-40%  - Continues to deteriorate NYHA IIIb-IV. Volume status elevated again, weight up another 5 lbs. - Continue torsemide 100 mg bid + increase metolazone to 4x week MWF Sat. - Continue carvedilol 3.125 mg bid. - Cardiomems denied. - He continues to deteriorate despite aggressive care. Dr. Haroldine Laws discussed Palliative Care involvement. They have a meeting tomorrow. He does not want to be re-hospitalized. - Check labs today. Will see back in 2-3 weeks.   2. Stage IIIb CKD due to cardiorenal syndrome  - Followed by nephrology, Dr. Theador Hawthorne.   - SCr 1.4-1.5 in 8/22. - New SCr baseline ~ 2.24 - Volume becoming more difficult to control. We discussed possible HD in past but I think he has become too weak to tolerate.  - Check labs today.   3. Mitral Regurgitation  - Moderate by TEE 8/21.  - Mild to moderate on echo 10/22.   4. Aortic Stenosis  - s/p TAVR 3/22. - Stable on echo.    5. Atrial Flutter/ Chronic Atrial Fibrillation - s/p AFL ablation.  - Remains in chronic A fib. Rate controlled - s/p frequent GI bleed so off Eliquis.  - CHA2DS2-VASc Score = at least 5.  - Unable to safely restart Eliquis. - EP felt not a Watchman candidate.   6. T2DM - On insulin. - Off Farxiga due to yeast infection. - Off Tyler Aas as of recent  admission due  to risk for hypoglycemia. Advised follow up with Endocrinology/PCP for further insulin instructions.  7. H/o CHB  - s/p PPM followed by Dr. Lovena Le. - ? RV paced CM. He's pacing < 10%. Had drop in EF 2 months after pacer was placed.  - Following with Dr.Taylor. Attempted CRT upgrade 3/22 but left subclavian vein occluded.   8. UGIB with iron deficiency anemia - EGD in 7/22 with duodenal ulcer with visible vessel. Underwent coiling of GDA.  - s/p EGD 11/22 with no active bleed; received 4 U PRBCs. - No abnormal bleeding. Hgb 10.1 1/23. - 04/03/20 Given feraheme.    Follow up with APP in 2 weeks (ReDs) and in 2-3 months with Dr. Haroldine Laws.  Rafael Bihari, FNP  3:15 PM

## 2021-05-17 ENCOUNTER — Encounter (HOSPITAL_COMMUNITY): Payer: Self-pay

## 2021-05-17 ENCOUNTER — Other Ambulatory Visit: Payer: Self-pay

## 2021-05-17 ENCOUNTER — Ambulatory Visit (HOSPITAL_COMMUNITY)
Admission: RE | Admit: 2021-05-17 | Discharge: 2021-05-17 | Disposition: A | Discharge status: Home / Self Care | Payer: Medicare HMO | Source: Ambulatory Visit | Attending: Family Medicine | Admitting: Family Medicine

## 2021-05-17 VITALS — BP 110/70 | HR 61 | Ht 72.0 in | Wt 232.0 lb

## 2021-05-17 DIAGNOSIS — I4892 Unspecified atrial flutter: Secondary | ICD-10-CM | POA: Insufficient documentation

## 2021-05-17 DIAGNOSIS — I5022 Chronic systolic (congestive) heart failure: Secondary | ICD-10-CM | POA: Diagnosis not present

## 2021-05-17 DIAGNOSIS — Z8719 Personal history of other diseases of the digestive system: Secondary | ICD-10-CM | POA: Diagnosis not present

## 2021-05-17 DIAGNOSIS — I428 Other cardiomyopathies: Secondary | ICD-10-CM | POA: Diagnosis not present

## 2021-05-17 DIAGNOSIS — N189 Chronic kidney disease, unspecified: Secondary | ICD-10-CM | POA: Diagnosis not present

## 2021-05-17 DIAGNOSIS — D649 Anemia, unspecified: Secondary | ICD-10-CM | POA: Diagnosis not present

## 2021-05-17 DIAGNOSIS — I4819 Other persistent atrial fibrillation: Secondary | ICD-10-CM | POA: Diagnosis not present

## 2021-05-17 DIAGNOSIS — D509 Iron deficiency anemia, unspecified: Secondary | ICD-10-CM | POA: Insufficient documentation

## 2021-05-17 DIAGNOSIS — I482 Chronic atrial fibrillation, unspecified: Secondary | ICD-10-CM | POA: Diagnosis not present

## 2021-05-17 DIAGNOSIS — I340 Nonrheumatic mitral (valve) insufficiency: Secondary | ICD-10-CM

## 2021-05-17 DIAGNOSIS — I080 Rheumatic disorders of both mitral and aortic valves: Secondary | ICD-10-CM | POA: Insufficient documentation

## 2021-05-17 DIAGNOSIS — Z950 Presence of cardiac pacemaker: Secondary | ICD-10-CM | POA: Diagnosis not present

## 2021-05-17 DIAGNOSIS — Z952 Presence of prosthetic heart valve: Secondary | ICD-10-CM | POA: Insufficient documentation

## 2021-05-17 DIAGNOSIS — Z794 Long term (current) use of insulin: Secondary | ICD-10-CM | POA: Diagnosis not present

## 2021-05-17 DIAGNOSIS — I130 Hypertensive heart and chronic kidney disease with heart failure and stage 1 through stage 4 chronic kidney disease, or unspecified chronic kidney disease: Secondary | ICD-10-CM | POA: Insufficient documentation

## 2021-05-17 DIAGNOSIS — I5023 Acute on chronic systolic (congestive) heart failure: Secondary | ICD-10-CM | POA: Diagnosis not present

## 2021-05-17 DIAGNOSIS — I129 Hypertensive chronic kidney disease with stage 1 through stage 4 chronic kidney disease, or unspecified chronic kidney disease: Secondary | ICD-10-CM | POA: Diagnosis not present

## 2021-05-17 DIAGNOSIS — Z8679 Personal history of other diseases of the circulatory system: Secondary | ICD-10-CM | POA: Diagnosis not present

## 2021-05-17 DIAGNOSIS — Z79899 Other long term (current) drug therapy: Secondary | ICD-10-CM | POA: Insufficient documentation

## 2021-05-17 DIAGNOSIS — I350 Nonrheumatic aortic (valve) stenosis: Secondary | ICD-10-CM | POA: Diagnosis not present

## 2021-05-17 DIAGNOSIS — N1832 Chronic kidney disease, stage 3b: Secondary | ICD-10-CM

## 2021-05-17 DIAGNOSIS — E1122 Type 2 diabetes mellitus with diabetic chronic kidney disease: Secondary | ICD-10-CM | POA: Diagnosis not present

## 2021-05-17 LAB — BASIC METABOLIC PANEL
Anion gap: 11 (ref 5–15)
BUN: 52 mg/dL — ABNORMAL HIGH (ref 8–23)
CO2: 23 mmol/L (ref 22–32)
Calcium: 9 mg/dL (ref 8.9–10.3)
Chloride: 97 mmol/L — ABNORMAL LOW (ref 98–111)
Creatinine, Ser: 1.95 mg/dL — ABNORMAL HIGH (ref 0.61–1.24)
GFR, Estimated: 35 mL/min — ABNORMAL LOW (ref >60)
Glucose, Bld: 305 mg/dL — ABNORMAL HIGH (ref 70–99)
Potassium: 4.2 mmol/L (ref 3.5–5.1)
Sodium: 131 mmol/L — ABNORMAL LOW (ref 135–145)

## 2021-05-17 LAB — BRAIN NATRIURETIC PEPTIDE: B Natriuretic Peptide: 1821.3 pg/mL — ABNORMAL HIGH (ref 0.0–100.0)

## 2021-05-17 MED ORDER — METOLAZONE 2.5 MG PO TABS
2.5000 mg | ORAL_TABLET | ORAL | 2 refills | Status: AC
Start: 2021-05-18 — End: ?

## 2021-05-17 NOTE — Patient Instructions (Signed)
INCREASE Metolazone to 4 times per week (Mondays, Wednesdays, Fridays, Sundays)  Labs today We will only contact you if something comes back abnormal or we need to make some changes. Otherwise no news is good news!  Your physician recommends that you schedule a follow-up appointment in: 2 weeks  in the Advanced Practitioners (PA/NP) Clinic and 3 months with Dr Aundra Dubin  Do the following things EVERYDAY: Weigh yourself in the morning before breakfast. Write it down and keep it in a log. Take your medicines as prescribed Eat low salt foods--Limit salt (sodium) to 2000 mg per day.  Stay as active as you can everyday Limit all fluids for the day to less than 2 liters  At the [facility], you and your health needs are our priority. As part of our continuing mission to provide you with exceptional heart care, we have created designated Provider Care Teams. These Care Teams include your primary Cardiologist (physician) and Advanced Practice Providers (APPs- Physician Assistants and Nurse Practitioners) who all work together to provide you with the care you need, when you need it.   You may see any of the following providers on your designated Care Team at your next follow up: Dr Glori Bickers Dr Haynes Kerns, NP Lyda Jester, [place] [facility] [place], [place] Audry Riles, PharmD   Please be sure to bring in all your medications bottles to every appointment.   If you have any questions or concerns before your next appointment please send [place] a message through [place] or call our office at [phone].    TO LEAVE A MESSAGE FOR THE NURSE SELECT OPTION 2, PLEASE LEAVE A MESSAGE INCLUDING: YOUR NAME DATE OF BIRTH CALL BACK NUMBER REASON FOR CALL**this is important as we prioritize the call backs  YOU WILL RECEIVE A CALL BACK THE SAME DAY AS LONG AS YOU CALL BEFORE 4:00 PM

## 2021-05-18 ENCOUNTER — Encounter: Payer: Self-pay | Admitting: Nurse Practitioner

## 2021-05-18 ENCOUNTER — Telehealth: Payer: Self-pay | Admitting: Nurse Practitioner

## 2021-05-18 DIAGNOSIS — R0602 Shortness of breath: Secondary | ICD-10-CM

## 2021-05-18 DIAGNOSIS — Z515 Encounter for palliative care: Secondary | ICD-10-CM

## 2021-05-18 DIAGNOSIS — I5022 Chronic systolic (congestive) heart failure: Secondary | ICD-10-CM

## 2021-05-18 DIAGNOSIS — R5381 Other malaise: Secondary | ICD-10-CM

## 2021-05-18 NOTE — Progress Notes (Signed)
[facility] Palliative Care Consult Note Telephone: [phone]  Fax: [phone]   Date of encounter: 05/18/21 10:50 PM PATIENT NAME: John [name] [address] [place] [address]   [phone] (home)  DOB: 1945-04-08 MRN: [id number] PRIMARY CARE PROVIDER:    [name], MD,  [place] [address] [phone]  REFERRING PROVIDER:   [name], MD [place],   [address] [phone]  RESPONSIBLE PARTY:    Contact Information     Name Relation Home Work [place] Spouse [phone]  [phone]      Due to the COVID-19 crisis, this visit was done via telemedicine from my office and it was initiated and consent by this patient and or family.  I connected with  John [name] OR PROXY on 05/18/21 by a video enabled telemedicine application and verified that I am speaking with the correct person using two identifiers.   I discussed the limitations of evaluation and management by telemedicine. The patient expressed understanding and agreed to proceed. Palliative Care was asked to follow this patient by consultation request of  John Squibb, MD to address advance care planning and complex medical decision making. This is the initial visit.                         ASSESSMENT AND PLAN / RECOMMENDATIONS:  Advance Care Planning/Goals of Care: Goals include to maximize quality of life and symptom management. Patient/health care surrogate gave his/her permission to discuss.Our advance care planning conversation included a discussion about:    The value and importance of advance care planning  Experiences with loved ones who have been seriously ill or have died  Exploration of personal, cultural or spiritual beliefs that might influence medical decisions  Exploration of goals of care in the event of a sudden injury or illness  Identification  of a healthcare agent  Review and  updating or creation of an  advance directive document . Decision not to resuscitate or to de-escalate disease focused treatments due to poor prognosis. CODE STATUS: DNR  Symptom Management/Plan: 1. Advance Care Planning;  DNR; Discussed option of Hospice with disease progression, John [name] in agreement to have Hospice physician review case for eligibility then revisit with John [name]  2. Goals of Care: Goals include to maximize quality of life and symptom management. Our advance care planning conversation included a discussion about:    The value and importance of advance care planning  Exploration of personal, cultural or spiritual beliefs that might influence medical decisions  Exploration of goals of care in the event of a sudden injury or illness  Identification and preparation of a healthcare agent  Review and updating or creation of an advance directive document.  3. Shortness of breath with debility secondary to CAD, systolic HF; continue to monitor respiratory symptoms, edema, weights, recently added metolazone with diuretic regimen. We talked about energy conservation, if hospice eligible will recommend oxygen supplemental   5. Palliative care encounter; Palliative care encounter; Palliative medicine team will continue to support patient, patient's family, and medical team. Visit consisted of counseling and education dealing with the complex and emotionally intense issues of symptom management and palliative care in the setting of serious and potentially life-threatening illness   Follow up Palliative Care Visit: Palliative care will continue to follow for complex medical decision making, advance care planning, and clarification  of goals. Return 2 weeks or prn.  I spent 76 minutes providing this consultation. More than 50% of the time in this consultation was spent in counseling and care coordination.  PPS: 40%  Chief Complaint: Initial palliative consult for complex medical  decision making  HISTORY OF PRESENT ILLNESS:  John [name] is a 76 y.o. year old male  with multiple medical problems including systolic CHF, CAD, aflutter, nonischemic cardiomyopathy, pacemaker, severe obesity, CKD, DM, HTN, HLD, mitral regurg, NASH, OA, polyarticular gout. Hospitalized 09/2020 for anemia with hyponatremia with transfusion and workup with duodenal ulcer; Hospitalization 01/2021 for GI bleed transfused, required octreotide infusion, hypotension. Hospitalized 03/2021 for acute on chronic severe CHF exacerbation,diuresed 22 lbs with iv lasix; with weight at d/c 204 lbs; echo EF 35% Saw CHF clinic 05/17/2021 with weight back up to 225, added metolazone to diuretic regimen. Telemedicine video visit with John [name]. We talked about what PC purpose is, John. [name] in agreement. We talked about the last time he was independent, functionally independently, past medical history, multiple hospitalization, chronic disease progression of CHF, CAD, CKD. We talked about ros, symptoms including shortness of breath. Currently not on O2. We talked about John. [name] able to walk from one room to another in his home and requires to sit down to rest. We talked about edema, weights due to fluid overload, appetite, nutrition, medical goals of care. John. [name] endorses he was told by Cardiology "nothing else they can do to help him". We talked about medical goals to focus on quality. We talked about life review, family dynamics. We talked about option of Hospice services under Medicare benefit, what services are provided. John. [name] in agreement to have hospice physicians evaluate for eligibility. We talked about role pc in poc. Reviewed DNR. Discussed will recontact when hospice physicians determine eligibility. John [name] in agreement. Therapeutic listening, emotional support provided. Questions answered.  History obtained from review of EMR, discussion with  John. [name].  I reviewed available labs, medications,  imaging, studies and related documents from the EMR.  Records reviewed and summarized above.   ROS 10 point system reviewed with John [name] all negative except HPI  Physical Exam: deferred CURRENT PROBLEM LIST:  Patient Active Problem List   Diagnosis Date Noted   Acute heart failure ([place]) 03/31/2021   Acute GI bleeding 02/06/2021   Melena 01/31/2021   Cirrhosis ([place]) 01/31/2021   Diverticulitis of colon 01/31/2021   Acute diverticulitis 01/26/2021   Acute on chronic systolic congestive heart failure ([place]) 12/31/2020   SOB (shortness of breath) 12/31/2020   GERD (gastroesophageal reflux disease) 12/31/2020   History of anemia due to chronic kidney disease 12/31/2020   Duodenal ulcer 11/22/2020   Iron deficiency anemia due to chronic blood loss 11/22/2020   Itching 11/22/2020   Anemia    Hematoma, chest wall 05/15/2020   Polyarticular gout 05/15/2020   S/P TAVR (transcatheter aortic valve replacement) 05/03/2020   Severe aortic stenosis 04/25/2020   Chronic atrial fibrillation ([place]) 04/25/2020   Acute on chronic systolic CHF (congestive heart failure) ([place]) 04/25/2020   Permanent atrial fibrillation ([place]) 03/10/2020   Calculus of gallbladder without cholecystitis without obstruction    Thrombocytopenia ([place]) 03/09/2020   Hypoalbuminemia 03/09/2020   NASH (nonalcoholic steatohepatitis) 03/03/2020   Lower leg edema 10/14/2019   Class 2 obesity    Acute on chronic combined systolic (congestive) and diastolic (congestive) heart failure ([place]) 11/12/2018   Hyponatremia 11/11/2018   CKD (chronic kidney disease) stage 3, GFR 30-59 ml/min (  [place])    Paroxysmal atrial fibrillation ([place])    Pacemaker 03/02/2018   Atrial pacemaker lead displacement 12/07/2015   NSVT (nonsustained ventricular tachycardia) 08/28/2015   Bradycardia 08/25/2015   Current use of long term anticoagulation 05/04/2011   Atrial flutter ([place]) 01/05/2011   DM type 2 causing vascular disease ([place]) 12/23/2009    Mixed hyperlipidemia 12/23/2009   Essential hypertension, benign 12/23/2009   PAST MEDICAL HISTORY:  Active Ambulatory Problems    Diagnosis Date Noted   DM type 2 causing vascular disease ([place]) 12/23/2009   Mixed hyperlipidemia 12/23/2009   Essential hypertension, benign 12/23/2009   Atrial flutter ([place]) 01/05/2011   Current use of long term anticoagulation 05/04/2011   Bradycardia 08/25/2015   NSVT (nonsustained ventricular tachycardia) 08/28/2015   Atrial pacemaker lead displacement 12/07/2015   Pacemaker 03/02/2018   CKD (chronic kidney disease) stage 3, GFR 30-59 ml/min (HCC)    Paroxysmal atrial fibrillation (HCC)    Hyponatremia 11/11/2018   Acute on chronic combined systolic (congestive) and diastolic (congestive) heart failure ([place]) 11/12/2018   Class 2 obesity    Lower leg edema 10/14/2019   NASH (nonalcoholic steatohepatitis) 03/03/2020   Thrombocytopenia ([place]) 03/09/2020   Hypoalbuminemia 03/09/2020   Permanent atrial fibrillation ([place]) 03/10/2020   Calculus of gallbladder without cholecystitis without obstruction    Severe aortic stenosis 04/25/2020   Chronic atrial fibrillation ([place]) 04/25/2020   Acute on chronic systolic CHF (congestive heart failure) ([place]) 04/25/2020   S/P TAVR (transcatheter aortic valve replacement) 05/03/2020   Hematoma, chest wall 05/15/2020   Polyarticular gout 05/15/2020   Anemia    Duodenal ulcer 11/22/2020   Iron deficiency anemia due to chronic blood loss 11/22/2020   Itching 11/22/2020   Acute on chronic systolic congestive heart failure ([place]) 12/31/2020   SOB (shortness of breath) 12/31/2020   GERD (gastroesophageal reflux disease) 12/31/2020   History of anemia due to chronic kidney disease 12/31/2020   Acute diverticulitis 01/26/2021   Melena 01/31/2021   Cirrhosis ([place]) 01/31/2021   Diverticulitis of colon 01/31/2021   Acute GI bleeding 02/06/2021   Acute heart failure ([place]) 03/31/2021   Resolved Ambulatory Problems     Diagnosis Date Noted   Paroxysmal supraventricular tachycardia ([place]) 12/23/2009   DEGENERATIVE JOINT DISEASE 12/23/2009   Chronic combined systolic and diastolic heart failure 26/71/2458   Status post ablation of atrial flutter 02/20/2011   Shoulder pain 07/14/2012   Second degree heart block 09/98/3382   Acute diastolic CHF (congestive heart failure) ([place]) 08/25/2015   Chronic diastolic CHF (congestive heart failure) ([place]) 08/28/2015   Hypertensive heart disease 08/28/2015   Elevated troponin 08/28/2015   CHF, acute on chronic ([place]) 11/20/2015   Complete heart block ([place]) 11/20/2015   Acute CHF (congestive heart failure) ([place]) 11/21/2015   History of colonic polyps 12/28/2016   Class 2 severe obesity due to excess calories with serious comorbidity and body mass index (BMI) of 37.0 to 37.9 in adult ([place]) 07/17/2017   Chronic HFrEF (heart failure with reduced ejection fraction) ([place]) 03/02/2018   CHF (congestive heart failure) ([place]) 08/19/2018   Hyperbilirubinemia 11/11/2018   Elevated LFTs 03/03/2020   Constipation 03/03/2020   Acute respiratory failure with hypoxemia ([place]) 03/09/2020   Acute kidney injury superimposed on CKD ([place]) 03/09/2020   Hyperammonemia ([place]) 03/09/2020   Elevated liver enzymes 03/09/2020   Dehydration 03/09/2020   Transaminitis 03/09/2020   Multifocal pneumonia 03/09/2020   Acute cholecystitis 03/09/2020   Prolonged QT interval 03/09/2020   Diffuse papular rash 03/09/2020  AKI (acute kidney injury) ([place]) 03/10/2020   Lobar pneumonia ([place]) 03/10/2020   Goals of care, counseling/discussion 03/10/2020   Acute on chronic systolic (congestive) heart failure ([place]) 04/25/2020   GIB (gastrointestinal bleeding) 10/17/2020   Acute GI bleeding 10/19/2020   Past Medical History:  Diagnosis Date   Chronic kidney disease, stage 3b (HCC)    Chronic systolic CHF (congestive heart failure) (HCC)    Diabetes mellitus    Hyperlipidemia    Hypertension    Mitral  regurgitation    NICM (nonischemic cardiomyopathy) (HCC)    Osteoarthritis    Presence of permanent cardiac pacemaker    PSVT (paroxysmal supraventricular tachycardia) ([place])    SOCIAL HX:  Social History   Tobacco Use   Smoking status: Former    Packs/day: 1.00    Years: 10.00    Pack years: 10.00    Types: Cigarettes    Quit date: 03/26/1986    Years since quitting: 35.1   Smokeless tobacco: Never   Tobacco comments:    "stopped cigarette  smoking 1988"  Substance Use Topics   Alcohol use: Not Currently    Alcohol/week: 0.0 standard drinks    Comment: "quit alcohol ~ 2007"   FAMILY HX:  Family History  Problem Relation Age of Onset   Heart attack Mother    Hypertension Mother    Heart attack Father    Hypertension Father    Heart attack Brother    Colon cancer Neg Hx       ALLERGIES: No Known Allergies   PERTINENT MEDICATIONS:  Outpatient Encounter Medications as of 05/18/2021  Medication Sig   ACCU-CHEK AVIVA PLUS test strip TEST BLOOD SUGAR TWICE DAILY BEFORE BREAKFAST AND AT BEDTIME   acetaminophen (TYLENOL) 325 MG tablet Take 2 tablets (650 mg total) by mouth every 4 (four) hours as needed for headache or mild pain.   allopurinol (ZYLOPRIM) 300 MG tablet Take 150 mg by mouth as needed (gout flare).   aspirin EC 81 MG tablet Take 1 tablet (81 mg total) by mouth daily with breakfast.   benzonatate (TESSALON) 100 MG capsule Take 1 capsule (100 mg total) by mouth 3 (three) times daily as needed for cough.   camphor-menthol (ANTI-ITCH) lotion Apply 1 application topically daily as needed for itching.   carvedilol (COREG) 3.125 MG tablet Take 1 tablet (3.125 mg total) by mouth 2 (two) times daily with a meal.   fexofenadine (ALLEGRA) 180 MG tablet Take 1 tablet by mouth daily.   hydrOXYzine (ATARAX) 10 MG tablet Take 10 mg by mouth 3 (three) times daily as needed.   insulin degludec (TRESIBA FLEXTOUCH) 100 UNIT/ML FlexTouch Pen Inject 30 Units into the skin at bedtime.    Insulin Pen Needle (BD PEN NEEDLE NANO U/F) 32G X 4 MM MISC 1 each by Does not apply route 4 (four) times daily.   linaclotide (LINZESS) 145 MCG CAPS capsule Take 1 capsule (145 mcg total) by mouth daily before breakfast.   metolazone (ZAROXOLYN) 2.5 MG tablet Take 1 tablet (2.5 mg total) by mouth 4 (four) times a week. Every Monday, Wednesday Friday, Sunday   Multiple Vitamins-Minerals (ONE-A-DAY MENS 50+) TABS Take 1 tablet by mouth daily with breakfast. Men   pantoprazole (PROTONIX) 40 MG tablet Take 1 tablet (40 mg total) by mouth daily.   potassium chloride SA (KLOR-CON M20) 20 MEQ tablet Take 2 tablets (40 mEq total) by mouth daily.   torsemide (DEMADEX) 20 MG tablet Take 5 tablets (100 mg total) by  mouth 2 (two) times daily.   No facility-administered encounter medications on file as of 05/18/2021.   Thank you for the opportunity to participate in the care of John. Reser.  The palliative care team will continue to follow. Please call our office at [phone] if we can be of additional assistance.   Audianna Landgren Z Skyler Dusing, NP ,

## 2021-05-19 ENCOUNTER — Emergency Department (HOSPITAL_COMMUNITY): Payer: Medicare HMO

## 2021-05-19 ENCOUNTER — Emergency Department (HOSPITAL_COMMUNITY)
Admission: EM | Admit: 2021-05-19 | Discharge: 2021-05-19 | Disposition: A | Discharge status: Home / Self Care | Payer: Medicare HMO | Attending: Emergency Medicine | Admitting: Emergency Medicine

## 2021-05-19 ENCOUNTER — Encounter (HOSPITAL_COMMUNITY): Payer: Self-pay

## 2021-05-19 ENCOUNTER — Other Ambulatory Visit: Payer: Self-pay

## 2021-05-19 DIAGNOSIS — Z950 Presence of cardiac pacemaker: Secondary | ICD-10-CM | POA: Insufficient documentation

## 2021-05-19 DIAGNOSIS — J811 Chronic pulmonary edema: Secondary | ICD-10-CM | POA: Diagnosis not present

## 2021-05-19 DIAGNOSIS — E1122 Type 2 diabetes mellitus with diabetic chronic kidney disease: Secondary | ICD-10-CM | POA: Insufficient documentation

## 2021-05-19 DIAGNOSIS — I110 Hypertensive heart disease with heart failure: Secondary | ICD-10-CM | POA: Diagnosis not present

## 2021-05-19 DIAGNOSIS — Z79899 Other long term (current) drug therapy: Secondary | ICD-10-CM | POA: Diagnosis not present

## 2021-05-19 DIAGNOSIS — I509 Heart failure, unspecified: Secondary | ICD-10-CM | POA: Diagnosis not present

## 2021-05-19 DIAGNOSIS — J90 Pleural effusion, not elsewhere classified: Secondary | ICD-10-CM | POA: Diagnosis not present

## 2021-05-19 DIAGNOSIS — I130 Hypertensive heart and chronic kidney disease with heart failure and stage 1 through stage 4 chronic kidney disease, or unspecified chronic kidney disease: Secondary | ICD-10-CM | POA: Diagnosis not present

## 2021-05-19 DIAGNOSIS — Z794 Long term (current) use of insulin: Secondary | ICD-10-CM | POA: Insufficient documentation

## 2021-05-19 DIAGNOSIS — N189 Chronic kidney disease, unspecified: Secondary | ICD-10-CM | POA: Diagnosis not present

## 2021-05-19 DIAGNOSIS — Z7982 Long term (current) use of aspirin: Secondary | ICD-10-CM | POA: Insufficient documentation

## 2021-05-19 DIAGNOSIS — I517 Cardiomegaly: Secondary | ICD-10-CM | POA: Diagnosis not present

## 2021-05-19 DIAGNOSIS — R0602 Shortness of breath: Secondary | ICD-10-CM | POA: Diagnosis not present

## 2021-05-19 LAB — CBC WITH DIFFERENTIAL/PLATELET
Abs Immature Granulocytes: 0.01 10*3/uL (ref 0.00–0.07)
Basophils Absolute: 0.1 10*3/uL (ref 0.0–0.1)
Basophils Relative: 2 %
Eosinophils Absolute: 0.3 10*3/uL (ref 0.0–0.5)
Eosinophils Relative: 6 %
HCT: 32.7 % — ABNORMAL LOW (ref 39.0–52.0)
Hemoglobin: 11 g/dL — ABNORMAL LOW (ref 13.0–17.0)
Immature Granulocytes: 0 %
Lymphocytes Relative: 18 %
Lymphs Abs: 0.9 10*3/uL (ref 0.7–4.0)
MCH: 30.1 pg (ref 26.0–34.0)
MCHC: 33.6 g/dL (ref 30.0–36.0)
MCV: 89.3 fL (ref 80.0–100.0)
Monocytes Absolute: 0.6 10*3/uL (ref 0.1–1.0)
Monocytes Relative: 13 %
Neutro Abs: 3 10*3/uL (ref 1.7–7.7)
Neutrophils Relative %: 61 %
Platelets: 98 10*3/uL — ABNORMAL LOW (ref 150–400)
RBC: 3.66 MIL/uL — ABNORMAL LOW (ref 4.22–5.81)
RDW: 24 % — ABNORMAL HIGH (ref 11.5–15.5)
WBC: 4.9 10*3/uL (ref 4.0–10.5)
nRBC: 0 % (ref 0.0–0.2)

## 2021-05-19 LAB — BASIC METABOLIC PANEL
Anion gap: 7 (ref 5–15)
BUN: 61 mg/dL — ABNORMAL HIGH (ref 8–23)
CO2: 26 mmol/L (ref 22–32)
Calcium: 9 mg/dL (ref 8.9–10.3)
Chloride: 101 mmol/L (ref 98–111)
Creatinine, Ser: 2.03 mg/dL — ABNORMAL HIGH (ref 0.61–1.24)
GFR, Estimated: 34 mL/min — ABNORMAL LOW (ref >60)
Glucose, Bld: 231 mg/dL — ABNORMAL HIGH (ref 70–99)
Potassium: 3.9 mmol/L (ref 3.5–5.1)
Sodium: 134 mmol/L — ABNORMAL LOW (ref 135–145)

## 2021-05-19 LAB — TROPONIN I (HIGH SENSITIVITY)
Troponin I (High Sensitivity): 65 ng/L — ABNORMAL HIGH (ref <18)
Troponin I (High Sensitivity): 59 ng/L — ABNORMAL HIGH (ref <18)

## 2021-05-19 LAB — BRAIN NATRIURETIC PEPTIDE: B Natriuretic Peptide: 1400 pg/mL — ABNORMAL HIGH (ref 0.0–100.0)

## 2021-05-19 MED ORDER — FUROSEMIDE 10 MG/ML IJ SOLN
80.0000 mg | Freq: Once | INTRAMUSCULAR | Status: AC
  Administered 2021-05-19: 80 mg via INTRAVENOUS
  Filled 2021-05-19: qty 8

## 2021-05-19 NOTE — Discharge Instructions (Signed)
Continue your torsemide and metolazone as recently increased by your cardiologist.  Call for close follow-up with your cardiologist on Monday.  If despite these medications you feel like your shortness of breath and swelling is getting worse please return to the ED for further evaluation.

## 2021-05-19 NOTE — ED Triage Notes (Signed)
Pt to er with sig other, sig other states that pt has a hx of congestive heart failure, states that he is here because he needs to get some fluid off, states that his sob is worse at night when he tries to lay down, pt denies chest pain.

## 2021-05-19 NOTE — Progress Notes (Signed)
AP APA04 [facility]) Hospital Liaison note:  This patient is currently enrolled in [facility] outpatient-based Palliative Care. Will continue to follow for disposition.  Please call with any outpatient palliative questions or concerns.  Thank you, Lorelee Market, LPN [facility] Liaison [phone]

## 2021-05-19 NOTE — ED Provider Notes (Signed)
[facility] EMERGENCY DEPARTMENT Provider Note   CSN: [id number] Arrival date & time: 05/19/21  1247     History  Chief Complaint  Patient presents with   Shortness of Breath    John [name] is a 76 y.o. male.  John [name] is a 76 y.o. male with a history of CHF, hypertension, hyperlipidemia, diabetes, bradycardia, a flutter, s/p pacemaker, CKD, Karlene Lineman, who presents for evaluation of shortness of breath and lower extremity swelling.  Patient is accompanied by his wife who helps to provide history.  Reports that he has been having increasing shortness of breath in particular at night when he tries to sleep, has been having to sleep in a recliner.  He also continues to have lower extremity swelling.  Followed by Dr. Haroldine Laws with cardiology, had follow-up earlier this week and they increased his diuretic regimen.  Patient reports shortness of breath primarily worse at night, no associated cough or fever.  No associated chest pain.  No lightheadedness or syncope.  Reports compliance with medications.  Chart review also reveals that patient was recently referred to and evaluated by palliative care.  Hospitalized in January for CHF exacerbation.  The history is provided by the patient, the spouse and medical records.  Shortness of Breath Associated symptoms: no abdominal pain, no chest pain, no cough, no fever and no vomiting       Home Medications Prior to Admission medications   Medication Sig Start Date End Date Taking? Authorizing Provider  ACCU-CHEK AVIVA PLUS test strip TEST BLOOD SUGAR TWICE DAILY BEFORE BREAKFAST AND AT BEDTIME 07/19/20   Cassandria Anger, MD  acetaminophen (TYLENOL) 325 MG tablet Take 2 tablets (650 mg total) by mouth every 4 (four) hours as needed for headache or mild pain. 05/15/20   Isaiah Serge, NP  allopurinol (ZYLOPRIM) 300 MG tablet Take 150 mg by mouth as needed (gout flare).    [provider]  aspirin EC 81 MG tablet Take 1 tablet  (81 mg total) by mouth daily with breakfast. 10/25/20 10/25/21  Roxan Hockey, MD  benzonatate (TESSALON) 100 MG capsule Take 1 capsule (100 mg total) by mouth 3 (three) times daily as needed for cough. 09/27/20   Evans Lance, MD  camphor-menthol (ANTI-ITCH) lotion Apply 1 application topically daily as needed for itching.    [provider]  carvedilol (COREG) 3.125 MG tablet Take 1 tablet (3.125 mg total) by mouth 2 (two) times daily with a meal. 04/04/21 05/17/21  Kayleen Memos, DO  fexofenadine (ALLEGRA) 180 MG tablet Take 1 tablet by mouth daily.    [provider]  hydrOXYzine (ATARAX) 10 MG tablet Take 10 mg by mouth 3 (three) times daily as needed.    [provider]  insulin degludec (TRESIBA FLEXTOUCH) 100 UNIT/ML FlexTouch Pen Inject 30 Units into the skin at bedtime.    [provider]  Insulin Pen Needle (BD PEN NEEDLE NANO U/F) 32G X 4 MM MISC 1 each by Does not apply route 4 (four) times daily. 12/23/18   Cassandria Anger, MD  linaclotide Rolan Lipa) 145 MCG CAPS capsule Take 1 capsule (145 mcg total) by mouth daily before breakfast. 01/26/21   Montez Morita, Quillian Quince, MD  metolazone (ZAROXOLYN) 2.5 MG tablet Take 1 tablet (2.5 mg total) by mouth 4 (four) times a week. Every Monday, Wednesday Friday, Sunday 05/18/21   Rafael Bihari, FNP  Multiple Vitamins-Minerals (ONE-A-DAY MENS 50+) TABS Take 1 tablet by mouth daily with breakfast. Men  [provider]  pantoprazole (PROTONIX) 40 MG tablet Take 1 tablet (40 mg total) by mouth daily. 02/12/21   Johnson, Clanford L, MD  potassium chloride SA (KLOR-CON M20) 20 MEQ tablet Take 2 tablets (40 mEq total) by mouth daily. 04/05/21 05/17/21  Kayleen Memos, DO  torsemide (DEMADEX) 20 MG tablet Take 5 tablets (100 mg total) by mouth 2 (two) times daily. 05/03/21   Bensimhon, Shaune Pascal, MD      Allergies    Patient has no known allergies.    Review of Systems   Review of Systems   Constitutional:  Negative for chills and fever.  Respiratory:  Positive for shortness of breath. Negative for cough.   Cardiovascular:  Positive for leg swelling. Negative for chest pain.  Gastrointestinal:  Negative for abdominal pain, nausea and vomiting.  Musculoskeletal:  Negative for myalgias.  Neurological:  Negative for syncope.  All other systems reviewed and are negative.  Physical Exam Updated Vital Signs BP 108/64 (BP Location: Right Arm)    Pulse 63    Temp (!) 97.5 F (36.4 C) (Oral)    Resp 18    Ht 6' (1.829 m)    Wt 99.8 kg    SpO2 99%    BMI 29.84 kg/m  Physical Exam Vitals and nursing note reviewed.  Constitutional:      General: He is not in acute distress.    Appearance: Normal appearance. He is well-developed. He is not diaphoretic.     Comments: Alert chronically ill appearing, no acute distress  HENT:     Head: Normocephalic and atraumatic.  Eyes:     General:        Right eye: No discharge.        Left eye: No discharge.     Pupils: Pupils are equal, round, and reactive to light.  Cardiovascular:     Rate and Rhythm: Normal rate and regular rhythm.     Pulses: Normal pulses.     Heart sounds: Normal heart sounds.  Pulmonary:     Effort: Pulmonary effort is normal. No respiratory distress.     Breath sounds: Decreased breath sounds and rales present. No wheezing.     Comments: Respirations equal and unlabored, patient able to speak in full sentences, satting well on RA, lung sounds slightly diminished with faint crackles in bilateral bases Chest:     Chest wall: No tenderness.  Abdominal:     General: Bowel sounds are normal. There is no distension.     Palpations: Abdomen is soft. There is no mass.     Tenderness: There is no abdominal tenderness. There is no guarding.     Comments: Abdomen soft, nondistended, nontender to palpation in all quadrants without guarding or peritoneal signs  Musculoskeletal:        General: No deformity.     Cervical  back: Neck supple.     Right lower leg: Edema present.     Left lower leg: Edema present.  Skin:    General: Skin is warm and dry.     Capillary Refill: Capillary refill takes less than 2 seconds.  Neurological:     Mental Status: He is alert and oriented to person, place, and time.     Coordination: Coordination normal.     Comments: Speech is clear, able to follow commands CN III-XII intact Normal strength in upper and lower extremities bilaterally including dorsiflexion and plantar flexion, strong and equal grip strength Sensation normal to light and  sharp touch Moves extremities without ataxia, coordination intact  Psychiatric:        Mood and Affect: Mood normal.        Behavior: Behavior normal.    ED Results / Procedures / Treatments   Labs (all labs ordered are listed, but only abnormal results are displayed) Labs Reviewed  BASIC METABOLIC PANEL - Abnormal; Notable for the following components:      Result Value   Sodium 134 (*)    Glucose, Bld 231 (*)    BUN 61 (*)    Creatinine, Ser 2.03 (*)    GFR, Estimated 34 (*)    All other components within normal limits  BRAIN NATRIURETIC PEPTIDE - Abnormal; Notable for the following components:   B Natriuretic Peptide 1,400.0 (*)    All other components within normal limits  CBC WITH DIFFERENTIAL/PLATELET - Abnormal; Notable for the following components:   RBC 3.66 (*)    Hemoglobin 11.0 (*)    HCT 32.7 (*)    RDW 24.0 (*)    Platelets 98 (*)    All other components within normal limits  TROPONIN I (HIGH SENSITIVITY) - Abnormal; Notable for the following components:   Troponin I (High Sensitivity) 65 (*)    All other components within normal limits  TROPONIN I (HIGH SENSITIVITY) - Abnormal; Notable for the following components:   Troponin I (High Sensitivity) 59 (*)    All other components within normal limits    EKG EKG Interpretation  Date/Time:  Friday May 19 2021 13:27:11 EST Ventricular Rate:  69 PR  Interval:    QRS Duration: 190 QT Interval:  538 QTC Calculation: 576 R Axis:   267 Text Interpretation: Ventricular-paced rhythm with occasional Premature ventricular complexes Abnormal ECG When compared with ECG of 10-Apr-2021 16:05, Premature ventricular complexes are now Present Vent. rate has increased BY   7 BPM since last tracing no significant change Confirmed by Daleen Bo [phone]) on 05/19/2021 2:30:19 PM  Radiology DG Chest 2 View  Result Date: 05/19/2021 CLINICAL DATA:  Shortness of breath EXAM: CHEST - 2 VIEW COMPARISON:  Previous studies including the examination of 04/01/2021 FINDINGS: Transverse diameter of heart is increased. Central pulmonary vessels are prominent. There is interval improvement in aeration of right parahilar region and right lower lung fields. No new focal infiltrates are seen. There is blunting of right lateral costophrenic angle. There is previous placement of prosthetic stent in the region of aortic valve. Pacemaker battery is seen in the left infraclavicular region. IMPRESSION: Cardiomegaly. Central pulmonary vessels are prominent suggesting mild CHF. There is interval decrease in interstitial markings in the right lung suggesting clearing of asymmetric pulmonary edema. Small right pleural effusion is present in the current study. Electronically Signed   By: Elmer Picker M.D.   On: 05/19/2021 14:39    Procedures Procedures    Medications Ordered in ED Medications  furosemide (LASIX) injection 80 mg (80 mg Intravenous Given 05/19/21 1630)    ED Course/ Medical Decision Making/ A&P                           This patient presents to the ED for concern of SOB, LE swelling, this involves an extensive number of treatment options, and is a complaint that carries with it a high risk of complications and morbidity.  The differential diagnosis includes CHF exacerbation, AKI, pneumonia, ACS, PE   Co morbidities that complicate the patient  evaluation  CHF,  DM, HTN, NASH, CKD   Additional history obtained:  Additional history obtained from [facility] External records from outside source obtained and reviewed including recent cards eval and palliative care eval   Lab Tests:  I Ordered, and personally interpreted labs.  The pertinent results include:  no leukocytosis, hgb stable, glucose 231, very mild increase in creatinine, no other significant electrolyte derangements, BNP 1,400, improved from cards evale a few days ago after diuretics increased, trop at baseline   Imaging Studies ordered:  I ordered imaging studies including CXR  I independently visualized and interpreted imaging which showed cardiomegaly and some congestion and edema, although improved from admission last month I agree with the radiologist interpretation   Cardiac Monitoring:  The patient was maintained on a cardiac monitor.  I personally viewed and interpreted the cardiac monitored which showed an underlying rhythm of: paced rhythm    Medicines ordered and prescription drug management:  I ordered medication including lasix  for fluid overload  Reevaluation of the patient after these medicines showed that the patient improved I have reviewed the patients home medicines and have made adjustments as needed   Problem List / ED Course:  Pt with CHF, has been dealing with exacerbation with SOB and LE swelling. Recently increased diuretic regimen, had worsening SOB last night Satting 99% on room air, w/ no increased work of breathing, weights have decreased since increasing diuretics, BNP and CXR improving Considered admission for further diuresis, but pt recently enrolled in palliative care with goal of reduced hospitalizations, overall seems to be  improving   Reevaluation:  After the interventions noted above, I reevaluated the patient and found that they have :improved    Dispostion:  After consideration of the diagnostic results and the  patients response to treatment, had shared decision making discussion with patient and wife at bedside, labs and CXR show some improvement. Pt has recently enrolled in palliative care with goal of spending less time hospitalized. He would prefer to go home and continue with increased diuretic regimen and see if symptoms continue to improve. Will follow closely with cardiologist.         Final Clinical Impression(s) / ED Diagnoses Final diagnoses:  Chronic congestive heart failure, unspecified heart failure type [facility])    Rx / DC Orders ED Discharge Orders     None         Jacqlyn Larsen, [place] 05/23/21 0334    Daleen Bo, MD 05/24/21 [phone]

## 2021-05-22 ENCOUNTER — Telehealth: Payer: Self-pay

## 2021-05-22 NOTE — Telephone Encounter (Signed)
1155 am.  Phone call made to patient to schedule a home visit this week.  Wife answered the phone and advised patient is asleep right now.  She is agreeable to a visit for Thursday at 12.

## 2021-05-24 ENCOUNTER — Other Ambulatory Visit: Payer: Self-pay

## 2021-05-24 ENCOUNTER — Telehealth: Payer: Self-pay | Admitting: Nurse Practitioner

## 2021-05-24 DIAGNOSIS — Z515 Encounter for palliative care: Secondary | ICD-10-CM

## 2021-05-24 NOTE — Telephone Encounter (Signed)
Vance Gather RN PC made in person visit, noted DNR wishes, no golden rod form in home. Complete golden rod form, sent to Mr. Koenen. Mr Bierlein in agreement to hospice. Will proceed with hospice ?

## 2021-05-25 DIAGNOSIS — I129 Hypertensive chronic kidney disease with stage 1 through stage 4 chronic kidney disease, or unspecified chronic kidney disease: Secondary | ICD-10-CM | POA: Diagnosis not present

## 2021-05-25 DIAGNOSIS — E871 Hypo-osmolality and hyponatremia: Secondary | ICD-10-CM | POA: Diagnosis not present

## 2021-05-25 DIAGNOSIS — I5022 Chronic systolic (congestive) heart failure: Secondary | ICD-10-CM | POA: Diagnosis not present

## 2021-05-25 DIAGNOSIS — E1129 Type 2 diabetes mellitus with other diabetic kidney complication: Secondary | ICD-10-CM | POA: Diagnosis not present

## 2021-05-25 DIAGNOSIS — R809 Proteinuria, unspecified: Secondary | ICD-10-CM | POA: Diagnosis not present

## 2021-05-25 DIAGNOSIS — N189 Chronic kidney disease, unspecified: Secondary | ICD-10-CM | POA: Diagnosis not present

## 2021-05-25 DIAGNOSIS — E1122 Type 2 diabetes mellitus with diabetic chronic kidney disease: Secondary | ICD-10-CM | POA: Diagnosis not present

## 2021-05-25 DIAGNOSIS — D649 Anemia, unspecified: Secondary | ICD-10-CM | POA: Diagnosis not present

## 2021-05-26 ENCOUNTER — Telehealth: Payer: Self-pay

## 2021-05-26 NOTE — Telephone Encounter (Signed)
1213 pm.  Follow up phone call made to PCP office to check on status of hospice referral.  Message left requesting orders for hospice and if PCP will serve as the attending of record while patient is under hospice.  Message to be sent directly to PCP.  Response pending. ?

## 2021-05-26 NOTE — Telephone Encounter (Signed)
410 pm.  Incoming call from [place], [place] for Dr. Nevada Crane.  Okay for hospice referral and PCP will serve as the attending of record.  ? ?[place] notified of referral.  ?

## 2021-05-26 NOTE — Progress Notes (Signed)
PATIENT NAME: John [name] ?DOB: 24-May-1945 ?MRN: [id number] ? ?PRIMARY CARE PROVIDER: [name], MD ? ?RESPONSIBLE PARTY:  ?Acct ID - Guarantor Home Phone Work Phone Relationship Acct Type  ?[id number] John, [name]* [phone]  Self P/F  ?   [address], [place], [place] [id number]  ? ? ?PLAN OF CARE and INTERVENTIONS: ?              1.  GOALS OF CARE/ ADVANCE CARE PLANNING:  Remain home under the care of his spouse.  Avoid hospitalizations.  ?              2.  PATIENT/CAREGIVER EDUCATION:  Hospice ?              4. PERSONAL EMERGENCY PLAN:  Activate 911 for emergencies.  ?              5.  DISEASE STATUS: ? ?CHF:  Patient was recently seen in the ED for CHF.  Elevated BNP noted.  Patient was diuresed and sent home.  Patient advised he is feeling better and breathing has improved.  He notes ongoing fatigue and minimal shortness of breath with exertion.   Patient is falling asleep throughout the visit.  ? ?Hospice:  Discussed disease progression and medical wishes patient has.  He desires DNR status and would like to avoid the hospital.  He states "there is nothing more to be done for me".   We further discuss hospice philosophy and services.  Patient and wife are agreeable to services.  Advised that I would follow up with PCP to obtain order for hospice and would notify the referral center once orders were received. Wife and patient are aware they will hear from [facility] once orders are in place.  Notified Christin Gusler, NP of DNR desire and hospice. ? ?Contacted PCP office of patient's desire and request for hospice.  ? ?HISTORY OF PRESENT ILLNESS:  multiple medical problems including systolic CHF, CAD, aflutter, nonischemic cardiomyopathy, pacemaker, severe obesity, CKD, DM, HTN, HLD, mitral regurg, NASH, OA, polyarticular gout. Hospitalized 09/2020 for anemia with hyponatremia with transfusion and workup with duodenal ulcer;  ? ?CODE STATUS: Desires DNR. ?ADVANCED DIRECTIVES: No ?MOST FORM:  Yes ?PPS: 40% ? ? ?PHYSICAL EXAM:  ? ?LUNGS: decreased breath sounds ?CARDIAC: Cor RRR}  ?EXTREMITIES: 2+ pitting edema to bilateral lower extremities-compression stockings in place. ?SKIN: Skin color, texture, turgor normal. No rashes or lesions or mobility and turgor normal  ?NEURO: positive for gait problems and memory problems ? ? ? ? ? ? ?Lorenza Burton, RN ? ?

## 2021-05-31 ENCOUNTER — Encounter (HOSPITAL_COMMUNITY): Payer: Medicare HMO

## 2021-05-31 NOTE — Progress Notes (Incomplete)
ADVANCED HF CLINIC NOTE  Primary Care: [name], MD Primary Cardiologist: Dr. Harl Bowie  HF Cardiologist: Dr. Haroldine Laws  Reason for Visit: Chronic Systolic Heart Failure   HPI: John [name] is a 76 y.o. male w/ systolic HF due to NICM (cath 9/21 no CAD), h/o CHB s/p PPM 2017,  HTN, DM2, atrial flutter, stage 3b CKD and severe AS s/p TAVR 2/22.   Echo 7/21 EF 30-35%. RV mildly reduced. Mod-sev MR Moderate AS  TEE 8/21 moderate MR and moderate TR. AoV severely thickened/heavily calcified  [facility] 9/21 No CAD. Moderately elevated filling pressures and moderately reduced CO. CI 1.8. Also concern for RV pacing CM with 42% RV pacing  Several admits in 12/21 for ADHF.   Admitted 2/22 for severe HF and TAVR. Started on dobutamine support. Underwent TVRF 05/03/20. Diuresed and DBA weaned off.   Attempted CRT upgrade 3/22. Left subclavian vein occluded.  Echo 3/22: EF 25-30% moderate MR. TAVR stable  Admitted 7/22 for anemia and hyponatremia. Hgb 14 -> 7.3  got 2u RBCs. EGD with duodenal ulcer with visible vessel so Eliquis held. Could not adequately clip so underwent IR embolizatio  Admitted 11/22 with GIB, hgb 6.4. GI consulted and had capsule endoscopy 02/07/21 showing old blood in the stomach but no new findings.Received 4 U PRBCs and octreotide, Cards consulted after he became hypotensive requiring NE. Diuretics held with bump in SCr, but torsemide 20 mg bid resumed at discharge. Discharge weight 215 lbs, hgb 8.5.  Saw Dr. Quentin Ore 12/28 for Watchman consideration, volume still up.   Admitted 1/23 for a/c CHF exacerbation after presenting to clinic with massive volume overload; had failed increased outpatient diuretic regimen. Diuresed 22 lbs with IV lasix. Hospitalization c/b cardiorenal syndrome. Discharged home on torsemide 80 mg bid + metolazone 2.5 on Mondays and Fridays. Cardiomems placement discussed, weight 204 lbs.  Echo 1/23 EF 35-40%   Follow up with DB 2/23 discussed  hospice/palliative care and metolazone added twice a week to diuretic regimen.   Today he returns for HF follow up with his wife. Overall feeling tired. Not able to stand very well, remains weak. Appetite is poor. Leg swelling is better but weight continues to climb, weight on home scale is 225. He is SOB with minimal exertion. Wife does not have any help at home for him, she worries with him going up and down their stairs. Denies CP, dizziness, palpitations, abnormal bleeding or PND/Orthopnea. Taking all medications. Has a Hospice/Palliative call/consult arranged for tomorrow.   Past Medical History:  Diagnosis Date   Atrial flutter ([place]) 12/2010   Admitted with symptomatic bradycardia (HR 40s), atrial flutter with slow ventricular response 12/2010 + volume overload; AV nodal agents d/c'd and Pradaxa started; RFA in 01/2011   Chronic kidney disease, stage 3b (HCC)    Chronic systolic CHF (congestive heart failure) ([place])    Class 2 severe obesity due to excess calories with serious comorbidity and body mass index (BMI) of 37.0 to 37.9 in adult ([place]) 07/17/2017   Diabetes mellitus    non insulin dependant   Hematoma, chest wall 05/15/2020   Hyperlipidemia    Hypertension    Mitral regurgitation    NASH (nonalcoholic steatohepatitis)    NICM (nonischemic cardiomyopathy) (HCC)    Osteoarthritis    Polyarticular gout 05/15/2020   Presence of permanent cardiac pacemaker    PSVT (paroxysmal supraventricular tachycardia) (HCC)    Possibly atrial flutter   S/P TAVR (transcatheter aortic valve replacement) 05/03/2020   s/p TAVR  with a 29 mm Edwards S3U via the subclavian approach with Dr. Angelena Form & Dr. Cyndia Bent    Severe aortic stenosis    Current Outpatient Medications  Medication Sig Dispense Refill   ACCU-CHEK AVIVA PLUS test strip TEST BLOOD SUGAR TWICE DAILY BEFORE BREAKFAST AND AT BEDTIME 200 strip 1   acetaminophen (TYLENOL) 325 MG tablet Take 2 tablets (650 mg total) by mouth every 4  (four) hours as needed for headache or mild pain.     allopurinol (ZYLOPRIM) 300 MG tablet Take 150 mg by mouth as needed (gout flare).     aspirin EC 81 MG tablet Take 1 tablet (81 mg total) by mouth daily with breakfast. 30 tablet 2   benzonatate (TESSALON) 100 MG capsule Take 1 capsule (100 mg total) by mouth 3 (three) times daily as needed for cough. (Patient not taking: Reported on 05/19/2021) 90 capsule 11   camphor-menthol (ANTI-ITCH) lotion Apply 1 application topically daily as needed for itching.     carvedilol (COREG) 3.125 MG tablet Take 1 tablet (3.125 mg total) by mouth 2 (two) times daily with a meal. 60 tablet 0   fexofenadine (ALLEGRA) 180 MG tablet Take 1 tablet by mouth daily.     hydrOXYzine (ATARAX) 10 MG tablet Take 10 mg by mouth 3 (three) times daily as needed.     insulin degludec (TRESIBA FLEXTOUCH) 100 UNIT/ML FlexTouch Pen Inject 30 Units into the skin at bedtime.     Insulin Pen Needle (BD PEN NEEDLE NANO U/F) 32G X 4 MM MISC 1 each by Does not apply route 4 (four) times daily. 150 each 5   linaclotide (LINZESS) 145 MCG CAPS capsule Take 1 capsule (145 mcg total) by mouth daily before breakfast. 90 capsule 3   metolazone (ZAROXOLYN) 2.5 MG tablet Take 1 tablet (2.5 mg total) by mouth 4 (four) times a week. Every Monday, Wednesday Friday, Sunday 30 tablet 2   Multiple Vitamins-Minerals (ONE-A-DAY MENS 50+) TABS Take 1 tablet by mouth daily with breakfast. Men     pantoprazole (PROTONIX) 40 MG tablet Take 1 tablet (40 mg total) by mouth daily. 60 tablet 1   potassium chloride SA (KLOR-CON M20) 20 MEQ tablet Take 2 tablets (40 mEq total) by mouth daily. 60 tablet 0   torsemide (DEMADEX) 20 MG tablet Take 5 tablets (100 mg total) by mouth 2 (two) times daily. 180 tablet 4   No current facility-administered medications for this visit.   No Known Allergies  Social History   Socioeconomic History   Marital status: Married    Spouse name: Not on file   Number of  children: 2   Years of education: Not on file   Highest education level: Not on file  Occupational History   Occupation: Designer, industrial/product    Employer: RETIRED  Tobacco Use   Smoking status: Former    Packs/day: 1.00    Years: 10.00    Pack years: 10.00    Types: Cigarettes    Quit date: 03/26/1986    Years since quitting: 35.2   Smokeless tobacco: Never   Tobacco comments:    "stopped cigarette  smoking 1988"  Vaping Use   Vaping Use: Never used  Substance and Sexual Activity   Alcohol use: Not Currently    Alcohol/week: 0.0 standard drinks    Comment: "quit alcohol ~ 2007"   Drug use: No   Sexual activity: Yes    Partners: Female  Other Topics Concern   Not on file  Social  History Narrative   Not on file   Social Determinants of Health   Financial Resource Strain: Not on file  Food Insecurity: No Food Insecurity   Worried About Charity fundraiser in the Last Year: Never true   Ran Out of Food in the Last Year: Never true  Transportation Needs: No Transportation Needs   Lack of Transportation (Medical): No   Lack of Transportation (Non-Medical): No  Physical Activity: Not on file  Stress: Not on file  Social Connections: Not on file  Intimate Partner Violence: Not on file   Family History  Problem Relation Age of Onset   Heart attack Mother    Hypertension Mother    Heart attack Father    Hypertension Father    Heart attack Brother    Colon cancer Neg Hx    There were no vitals taken for this visit.  Wt Readings from Last 3 Encounters:  05/19/21 99.8 kg  05/17/21 105.2 kg  05/08/21 102.5 kg   PHYSICAL EXAM: General:  NAD. No resp difficulty, arrived in WC, weak HEENT: Normal Neck: Supple. JVP to ear. Carotids 2+ bilat; no bruits. No lymphadenopathy or thryomegaly appreciated. Cor: PMI nondisplaced. Irregular rate & rhythm. No rubs, gallops, 2/6 SEM Lungs: Faint crackles RLL Abdomen: Soft, nontender, nondistended. No hepatosplenomegaly. No bruits  or masses. Good bowel sounds. Extremities: No cyanosis, clubbing, rash, 1-2+ BLE edema R>L Neuro: Alert & oriented x 3, cranial nerves grossly intact. Moves all 4 extremities w/o difficulty. Affect pleasant.  ASSESSMENT & PLAN: 1. Chronic Systolic Heart Failure/NICM: - Echo 6/17 EF 50-55% (PPM placed in 08/2015)  - Echo 3/22 EF 25-30% TAVR ok. Mod MR .-LHC 9/21 that showed no coronary disease.  - based on timing of drop in EF and 42% pacing percentage, concern for RV paced CM +AS. Left subclavian vein occluded -> unable to upgrade to CRT. - s/p TAVR 2/22. - Echo 10/22 EF 30-35%. - Echo 1/23 EF 35-40%  - Continues to deteriorate NYHA IIIb-IV. Volume status elevated again, weight up another 5 lbs. - Continue torsemide 100 mg bid + increase metolazone to 4x week MWF Sat. - Continue carvedilol 3.125 mg bid. - Cardiomems denied. - He continues to deteriorate despite aggressive care. Dr. Haroldine Laws discussed Palliative Care involvement. They have a meeting tomorrow. He does not want to be re-hospitalized. - Check labs today. Will see back in 2-3 weeks.   2. Stage IIIb CKD due to cardiorenal syndrome  - Followed by nephrology, Dr. Theador Hawthorne.   - SCr 1.4-1.5 in 8/22. - New SCr baseline ~ 2.24 - Volume becoming more difficult to control. We discussed possible HD in past but I think he has become too weak to tolerate.  - Check labs today.   3. Mitral Regurgitation  - Moderate by TEE 8/21.  - Mild to moderate on echo 10/22.   4. Aortic Stenosis  - s/p TAVR 3/22. - Stable on echo.    5. Atrial Flutter/ Chronic Atrial Fibrillation - s/p AFL ablation.  - Remains in chronic A fib. Rate controlled - s/p frequent GI bleed so off Eliquis.  - CHA2DS2-VASc Score = at least 5.  - Unable to safely restart Eliquis. - EP felt not a Watchman candidate.   6. T2DM - On insulin. - Off Farxiga due to yeast infection. - Off Tyler Aas as of recent admission due to risk for hypoglycemia. Advised follow up  with Endocrinology/PCP for further insulin instructions.  7. H/o CHB  - s/p PPM followed  by Dr. Lovena Le. - ? RV paced CM. He's pacing < 10%. Had drop in EF 2 months after pacer was placed.  - Following with Dr.Taylor. Attempted CRT upgrade 3/22 but left subclavian vein occluded.   8. UGIB with iron deficiency anemia - EGD in 7/22 with duodenal ulcer with visible vessel. Underwent coiling of GDA.  - s/p EGD 11/22 with no active bleed; received 4 U PRBCs. - No abnormal bleeding. Hgb 10.1 1/23. - 04/03/20 Given feraheme.    Follow up with APP in 2 weeks (ReDs) and in 2-3 months with Dr. Haroldine Laws.  John Bihari, FNP  8:15 AM

## 2021-06-03 DIAGNOSIS — D62 Acute posthemorrhagic anemia: Secondary | ICD-10-CM | POA: Diagnosis not present

## 2021-06-03 DIAGNOSIS — K7581 Nonalcoholic steatohepatitis (NASH): Secondary | ICD-10-CM | POA: Diagnosis not present

## 2021-06-03 DIAGNOSIS — E1165 Type 2 diabetes mellitus with hyperglycemia: Secondary | ICD-10-CM | POA: Diagnosis not present

## 2021-06-03 DIAGNOSIS — K222 Esophageal obstruction: Secondary | ICD-10-CM | POA: Diagnosis not present

## 2021-06-03 DIAGNOSIS — I130 Hypertensive heart and chronic kidney disease with heart failure and stage 1 through stage 4 chronic kidney disease, or unspecified chronic kidney disease: Secondary | ICD-10-CM | POA: Diagnosis not present

## 2021-06-03 DIAGNOSIS — K5732 Diverticulitis of large intestine without perforation or abscess without bleeding: Secondary | ICD-10-CM | POA: Diagnosis not present

## 2021-06-03 DIAGNOSIS — E1122 Type 2 diabetes mellitus with diabetic chronic kidney disease: Secondary | ICD-10-CM | POA: Diagnosis not present

## 2021-06-03 DIAGNOSIS — I5043 Acute on chronic combined systolic (congestive) and diastolic (congestive) heart failure: Secondary | ICD-10-CM | POA: Diagnosis not present

## 2021-06-03 DIAGNOSIS — K3189 Other diseases of stomach and duodenum: Secondary | ICD-10-CM | POA: Diagnosis not present

## 2021-07-04 ENCOUNTER — Telehealth: Payer: Self-pay | Admitting: "Endocrinology

## 2021-07-04 NOTE — Telephone Encounter (Signed)
John [name] is the patient hospice nurse and states that the patient has had poor appetite and is only eating half of a regular meal a day, the wife was not checking sugar, but still giving him insulin. The nurse contacted the hospice doctor and he advised her to start checking his sugar again which she is only checking in the morning and to not to give him his insulin unless his sugar is more than 150. They were running in the 120s until last Thursday they spiked up and his appetite has not changed, still barely eating half a regular meal a day.  ? ?CB# [phone] John [name] (hospice nurse)  ? ?4/6 402 ? ?4/7 415 ? ?4/8 398 ? ?4/9 413 ? ?4/10 500 ? ?4/11 451 ? ? ?

## 2021-07-04 NOTE — Telephone Encounter (Signed)
Patient is also scheduled for a visit 4/24 and is acquiring about it being a virtual visit.  ?

## 2021-07-04 NOTE — Telephone Encounter (Signed)
Discussed with [name] from Hospice, understanding voiced. Dr.Nida is ok with a virtual visit for pt. ?

## 2021-07-10 ENCOUNTER — Other Ambulatory Visit: Payer: Self-pay | Admitting: Internal Medicine

## 2021-07-14 ENCOUNTER — Telehealth (HOSPITAL_COMMUNITY): Payer: Self-pay | Admitting: Cardiology

## 2021-07-14 NOTE — Telephone Encounter (Signed)
John Art, RN with Hospice [phone] ?Called to report patient is declining quickly ?Would like to ensure steps are in place to de activate his device ? ? ?Per Maudie Mercury with EP patient has a pacemaker only-deactivation is not needed ? ?Information relayed to [facility].  ?

## 2021-07-17 ENCOUNTER — Encounter: Payer: Self-pay | Admitting: "Endocrinology

## 2021-07-17 ENCOUNTER — Telehealth (INDEPENDENT_AMBULATORY_CARE_PROVIDER_SITE_OTHER): Admitting: "Endocrinology

## 2021-07-17 VITALS — BP 100/60

## 2021-07-17 DIAGNOSIS — E1159 Type 2 diabetes mellitus with other circulatory complications: Secondary | ICD-10-CM | POA: Diagnosis not present

## 2021-07-17 NOTE — Progress Notes (Signed)
? ?                                                               ?     07/17/2021, 4:20 PM ? ? ? ?                                                  Endocrinology Telehealth Visit Follow up Note -During COVID -19 Pandemic ? ?This visit type was conducted  via telephone . conversation was made with his wife Mrs. Fetterman.  This format is felt to be most appropriate for him at this time.  I connected with Mrs. Marcello Moores on behalf of Mr. Forst on 07/17/2021   by telephone and verified that I am speaking with the correct person using two identifiers.  She  he has verbally consented to this visit. I, Glade Lloyd, MD was in my office and patient , John [name] , was in his residence. No other persons were with me during the encounter. ?All issues noted in this document were discussed and addressed. The format was not optimal for physical exam.  ? ? ?Subjective:  ? ? Patient ID: John [name], male    DOB: Mar 24, 1946.  ?[name] is was contacted by me regarding a telephone visit for management hyperglycemia.   ? ?PMD:  [name], MD. ? ? ?Past Medical History:  ?Diagnosis Date  ? Atrial flutter ([place]) 12/2010  ? Admitted with symptomatic bradycardia (HR 40s), atrial flutter with slow ventricular response 12/2010 + volume overload; AV nodal agents d/c'd and Pradaxa started; RFA in 01/2011  ? Chronic kidney disease, stage 3b ([place])   ? Chronic systolic CHF (congestive heart failure) ([place])   ? Class 2 severe obesity due to excess calories with serious comorbidity and body mass index (BMI) of 37.0 to 37.9 in adult [facility]) 07/17/2017  ? Diabetes mellitus   ? non insulin dependant  ? Hematoma, chest wall 05/15/2020  ? Hyperlipidemia   ? Hypertension   ? Mitral regurgitation   ? NASH (nonalcoholic steatohepatitis)   ? NICM (nonischemic cardiomyopathy) ([place])   ? Osteoarthritis   ? Polyarticular gout 05/15/2020  ? Presence of permanent cardiac pacemaker   ? PSVT (paroxysmal supraventricular tachycardia)  ([place])   ? Possibly atrial flutter  ? S/P TAVR (transcatheter aortic valve replacement) 05/03/2020  ? s/p TAVR with a 29 mm Edwards S3U via the subclavian approach with Dr. Angelena Form & Dr. Cyndia Bent   ? Severe aortic stenosis   ? ?Past Surgical History:  ?Procedure Laterality Date  ? ATRIAL FLUTTER ABLATION N/A 02/07/2011  ? Procedure: ATRIAL FLUTTER ABLATION;  Surgeon: Evans Lance, MD;  Location: [facility] CATH LAB;  Service: Cardiovascular;  Laterality: N/A;  ? BIV UPGRADE N/A 06/08/2020  ? Procedure: BIV PPM UPGRADE;  Surgeon: Evans Lance, MD;  Location: [place] CV LAB;  Service: Cardiovascular;  Laterality: N/A;  ? CARDIAC ELECTROPHYSIOLOGY STUDY AND ABLATION  02/07/2011  ? CARDIOVERSION N/A 03/23/2016  ? Procedure: CARDIOVERSION;  Surgeon: Evans Lance, MD;  Location: [place];  Service: Cardiovascular;  Laterality: N/A;  ? COLONOSCOPY N/A 04/17/2017  ?  [place]: 3 polyps in [facility] [place], diverticulosis  ? COLONOSCOPY N/A 02/23/2021  ? Procedure: COLONOSCOPY;  Surgeon: Rogene Houston, MD;  [facility];  Service: Endoscopy;  Laterality: N/A;  12:15  ? EP IMPLANTABLE DEVICE N/A 08/29/2015  ? Procedure: Pacemaker Implant;  Surgeon: Evans Lance, MD;  Location: [place] CV LAB;  Service: Cardiovascular;  Laterality: N/A;  ? EP IMPLANTABLE DEVICE N/A 12/07/2015  ? Procedure: PPM Lead Revision/Repair;  Surgeon: Evans Lance, MD;  Location: [place] CV LAB;  Service: Cardiovascular;  Laterality: N/A;  ? ESOPHAGOGASTRODUODENOSCOPY (EGD) WITH PROPOFOL N/A 10/18/2020  ? Castaneda: normal esophagus, erythematous mucosa in gastric body, non bleeding duodenal ulcer with a non bleeding visible vessel (forrest Class IIa). Injected, clip placed, no specimens collected.  ? ESOPHAGOGASTRODUODENOSCOPY (EGD) WITH PROPOFOL N/A 01/27/2021  ? Procedure: ESOPHAGOGASTRODUODENOSCOPY (EGD) WITH PROPOFOL;  Surgeon: [name], DO;  [facility];  Service: Endoscopy;  Laterality: N/A;  ?  ESOPHAGOGASTRODUODENOSCOPY (EGD) WITH PROPOFOL N/A 02/09/2021  ? Procedure: ESOPHAGOGASTRODUODENOSCOPY (EGD) WITH PROPOFOL;  Surgeon: [name], DO;  [facility];  Service: Endoscopy;  Laterality: N/A;  ? GIVENS CAPSULE STUDY N/A 02/07/2021  ? Procedure: GIVENS CAPSULE STUDY;  Surgeon: Harvel Quale, MD;  [facility];  Service: Gastroenterology;  Laterality: N/A;  ? IR ANGIOGRAM SELECTIVE EACH ADDITIONAL VESSEL  10/19/2020  ? IR ANGIOGRAM SELECTIVE EACH ADDITIONAL VESSEL  10/19/2020  ? IR ANGIOGRAM VISCERAL SELECTIVE  10/19/2020  ? IR ANGIOGRAM VISCERAL SELECTIVE  10/19/2020  ? IR EMBO ART  VEN HEMORR LYMPH EXTRAV  INC GUIDE ROADMAPPING  10/19/2020  ? IR US GUIDE VASC ACCESS RIGHT  10/19/2020  ? KNEE ARTHROSCOPY  03/27/2003  ? left  ? LUMBAR SPINE SURGERY    ? "I've had 6 ORs 1972 thru 2004"  ? POLYPECTOMY  04/17/2017  ? Procedure: POLYPECTOMY;  Surgeon: Rogene Houston, MD;  [facility];  Service: Endoscopy;;  transverse colon x3;  ? RIGHT HEART CATH N/A 04/28/2020  ? Procedure: RIGHT HEART CATH;  Surgeon: Jolaine Artist, MD;  Location: [place] CV LAB;  Service: Cardiovascular;  Laterality: N/A;  ? RIGHT/LEFT HEART CATH AND CORONARY ANGIOGRAPHY N/A 12/21/2019  ? Procedure: RIGHT/LEFT HEART CATH AND CORONARY ANGIOGRAPHY;  Surgeon: Nelva Bush, MD;  Location: [place] CV LAB;  Service: Cardiovascular;  Laterality: N/A;  ? TEE WITHOUT CARDIOVERSION N/A 11/05/2019  ? Procedure: TRANSESOPHAGEAL ECHOCARDIOGRAM (TEE) WITH PROPOFOL;  Surgeon: Arnoldo Lenis, MD;  [facility];  Service: Endoscopy;  Laterality: N/A;  ? TEE WITHOUT CARDIOVERSION N/A 05/03/2020  ? Procedure: TRANSESOPHAGEAL ECHOCARDIOGRAM (TEE);  Surgeon: Burnell Blanks, MD;  Location: [place];  Service: Open Heart Surgery;  Laterality: N/A;  ? ?Social History  ? ?Socioeconomic History  ? Marital status: Married  ?  Spouse name: Not on file  ? Number of children: 2   ? Years of education: Not on file  ? Highest education level: Not on file  ?Occupational History  ? Occupation: Designer, industrial/product  ?  Employer: RETIRED  ?Tobacco Use  ? Smoking status: Former  ?  Packs/day: 1.00  ?  Years: 10.00  ?  Pack years: 10.00  ?  Types: Cigarettes  ?  Quit date: 03/26/1986  ?  Years since quitting: 35.3  ? Smokeless tobacco: Never  ? Tobacco comments:  ?  "stopped cigarette  smoking 1988"  ?Vaping Use  ? Vaping Use: Never used  ?Substance and Sexual  Activity  ? Alcohol use: Not Currently  ?  Alcohol/week: 0.0 standard drinks  ?  Comment: "quit alcohol ~ 2007"  ? Drug use: No  ? Sexual activity: Yes  ?  Partners: Female  ?Other Topics Concern  ? Not on file  ?Social History Narrative  ? Not on file  ? ?Social Determinants of Health  ? ?Financial Resource Strain: Not on file  ?Food Insecurity: No Food Insecurity  ? Worried About Charity fundraiser in the Last Year: Never true  ? Ran Out of Food in the Last Year: Never true  ?Transportation Needs: No Transportation Needs  ? Lack of Transportation (Medical): No  ? Lack of Transportation (Non-Medical): No  ?Physical Activity: Not on file  ?Stress: Not on file  ?Social Connections: Not on file  ? ?Outpatient Encounter Medications as of 07/17/2021  ?Medication Sig  ? benzonatate (TESSALON) 100 MG capsule Take 1 capsule (100 mg total) by mouth 3 (three) times daily as needed for cough.  ? ACCU-CHEK AVIVA PLUS test strip TEST BLOOD SUGAR TWICE DAILY BEFORE BREAKFAST AND AT BEDTIME  ? acetaminophen (TYLENOL) 325 MG tablet Take 2 tablets (650 mg total) by mouth every 4 (four) hours as needed for headache or mild pain.  ? allopurinol (ZYLOPRIM) 300 MG tablet Take 150 mg by mouth as needed (gout flare).  ? aspirin EC 81 MG tablet Take 1 tablet (81 mg total) by mouth daily with breakfast.  ? camphor-menthol (ANTI-ITCH) lotion Apply 1 application topically daily as needed for itching.  ? carvedilol (COREG) 3.125 MG tablet Take 1 tablet (3.125 mg total)  by mouth 2 (two) times daily with a meal.  ? fexofenadine (ALLEGRA) 180 MG tablet Take 1 tablet by mouth daily.  ? hydrOXYzine (ATARAX) 10 MG tablet Take 10 mg by mouth 3 (three) times daily as needed.  ? insulin degludec

## 2021-07-24 DEATH — deceased

## 2021-07-31 ENCOUNTER — Ambulatory Visit (INDEPENDENT_AMBULATORY_CARE_PROVIDER_SITE_OTHER): Payer: Medicare HMO | Admitting: Gastroenterology

## 2021-08-16 ENCOUNTER — Encounter (HOSPITAL_COMMUNITY): Payer: Medicare HMO | Admitting: Internal Medicine

## 2021-09-07 ENCOUNTER — Ambulatory Visit (INDEPENDENT_AMBULATORY_CARE_PROVIDER_SITE_OTHER): Payer: Medicare HMO | Admitting: Gastroenterology

## 2022-03-13 IMAGING — US US ABDOMEN COMPLETE
1 series · 13 of 25 positions shown · non-contrast
Comparison: None.

CLINICAL DATA: Elevated liver function tests.

EXAM:
ABDOMEN ULTRASOUND COMPLETE

[Series 1: us abdomen complete · 13 of 100 slices shown]
[im 1/100]
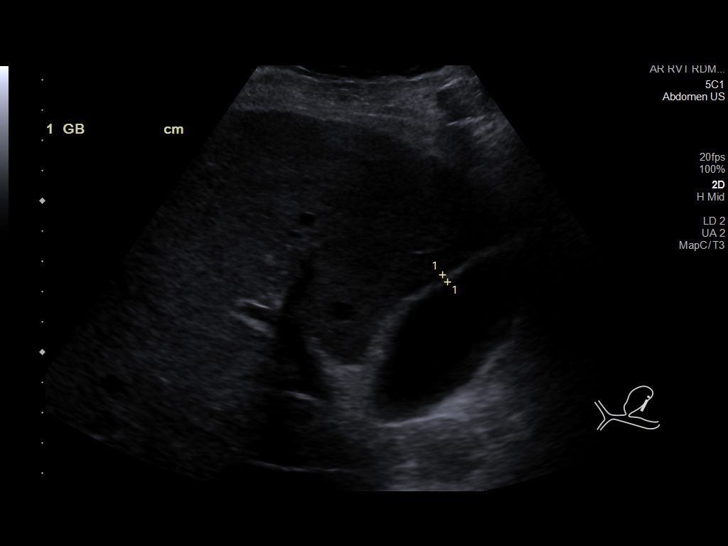
[im 9/100]
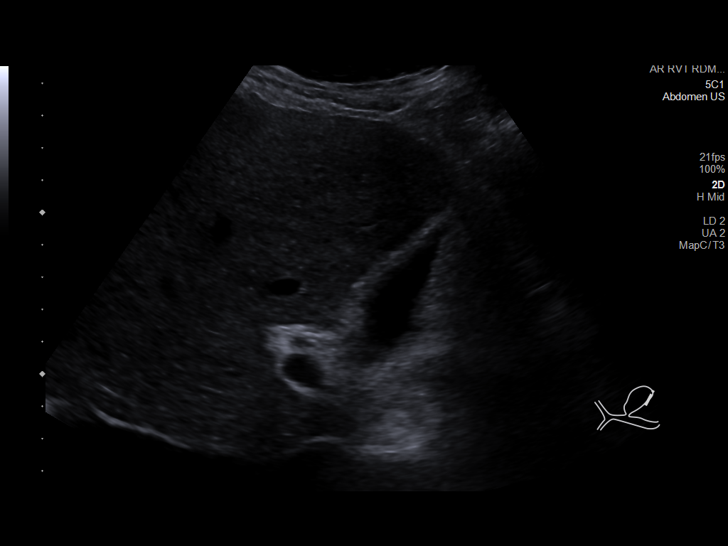
[im 17/100]
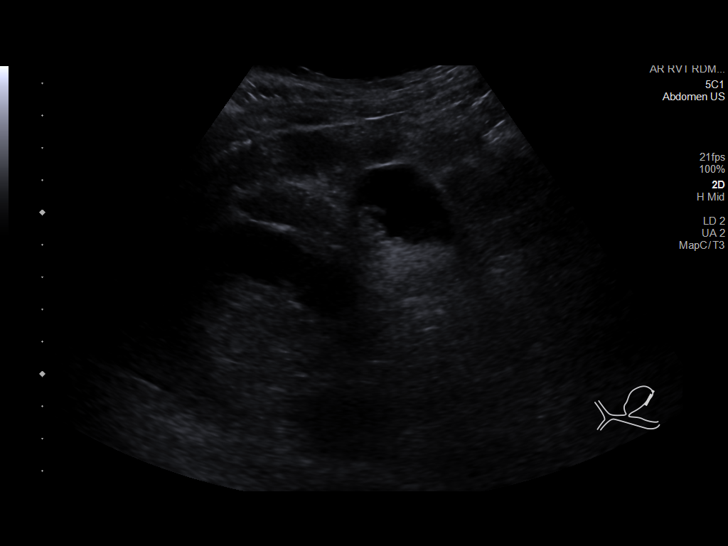
[im 25/100]
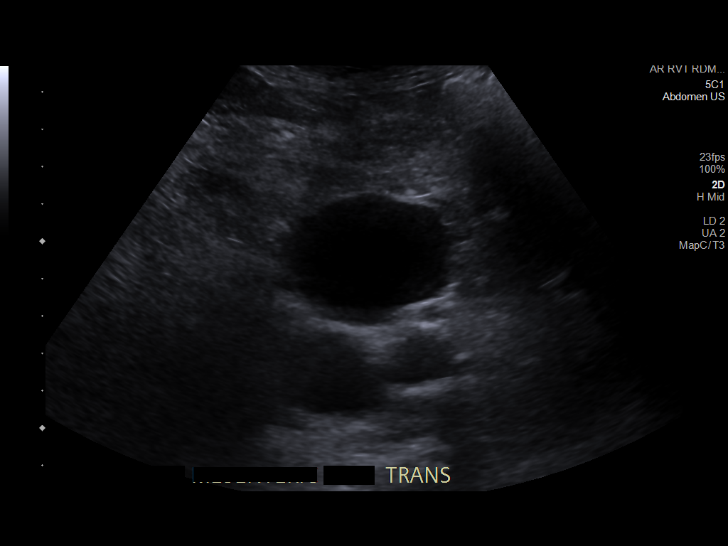
[im 34/100]
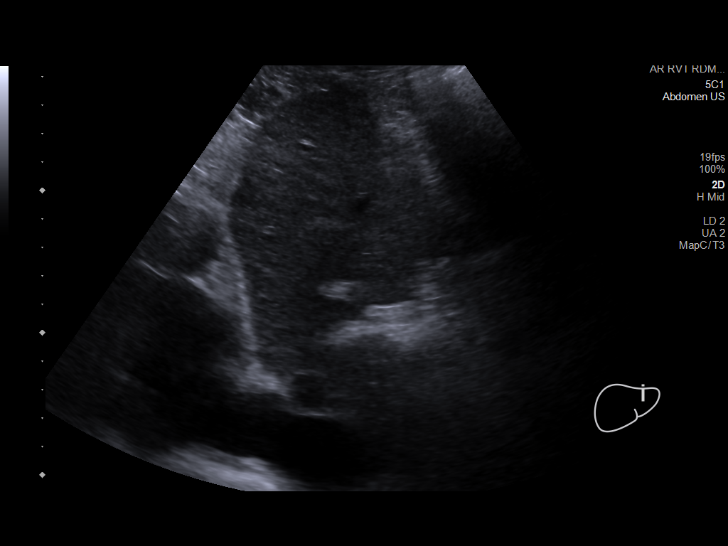
[im 42/100]
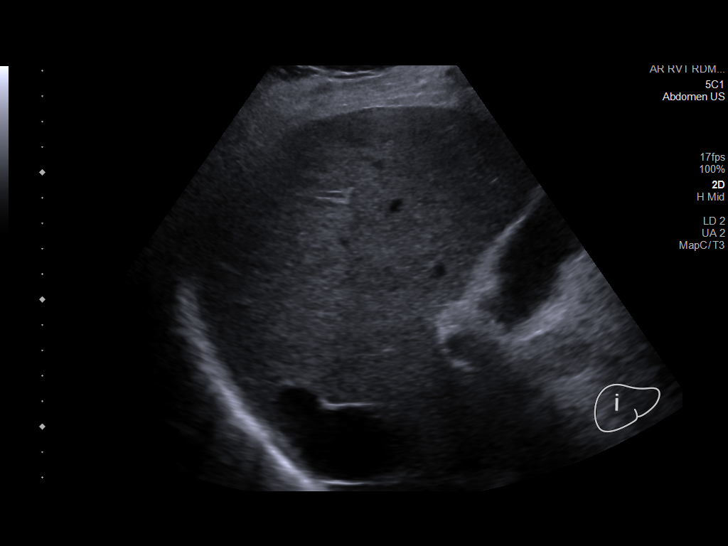
[im 50/100]
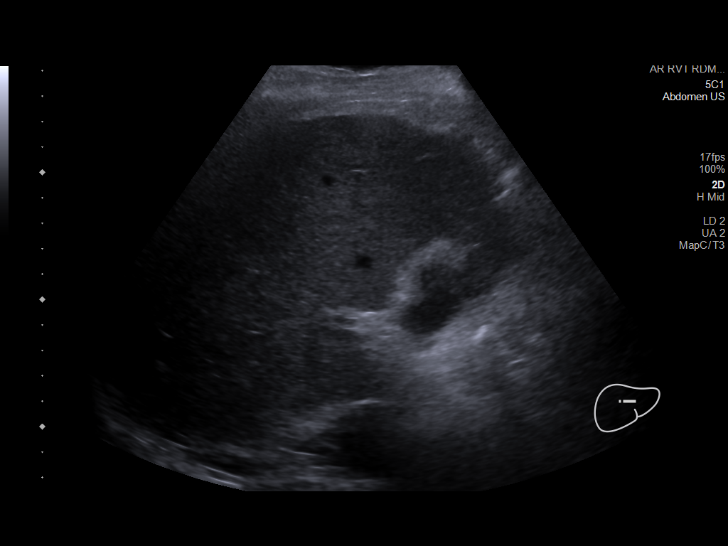
[im 58/100]
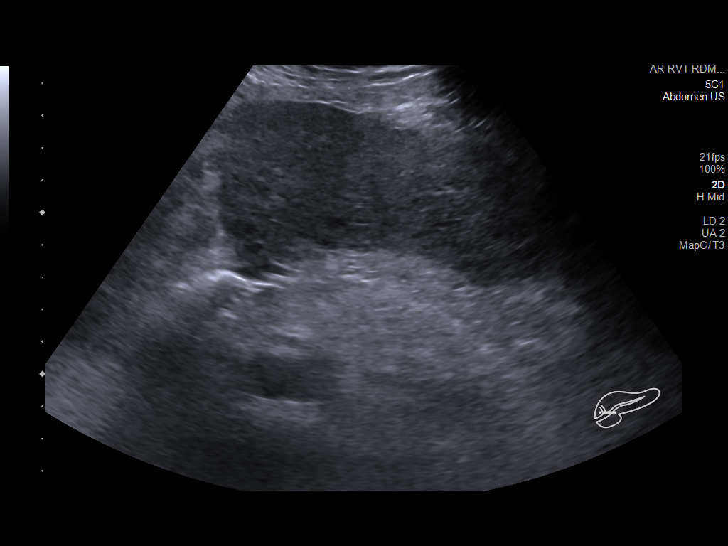
[im 67/100]
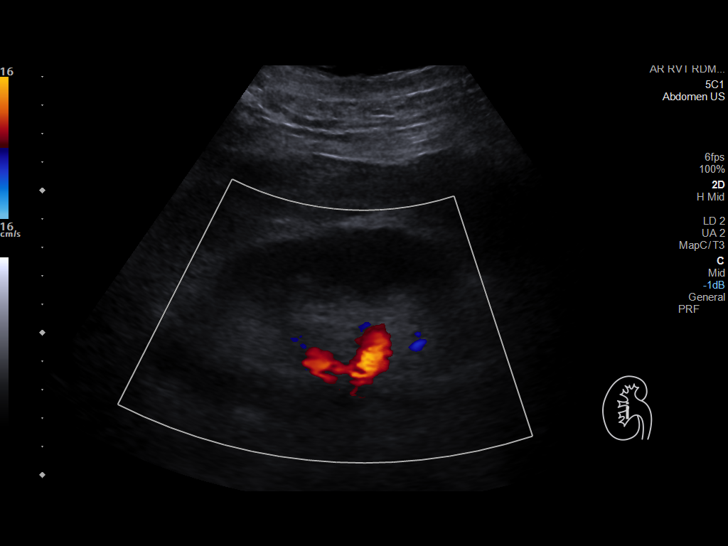
[im 75/100]
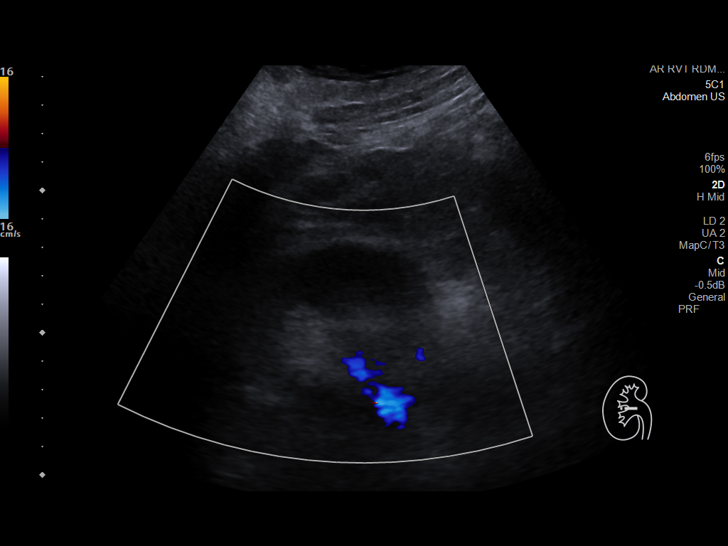
[im 83/100]
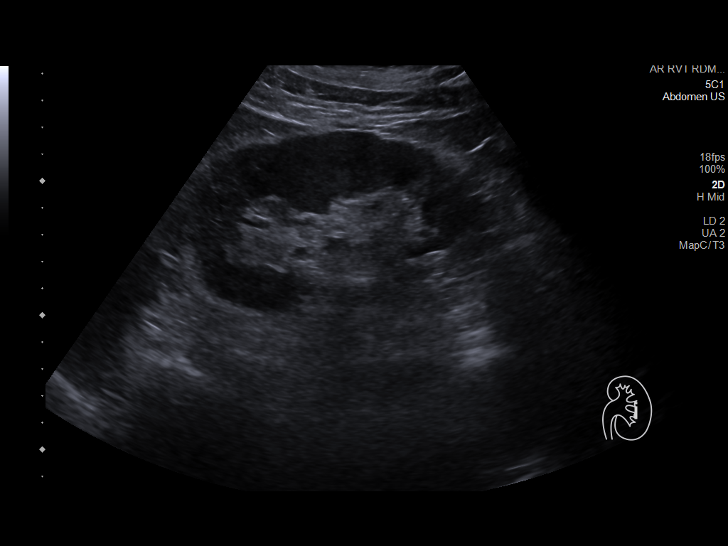
[im 91/100]
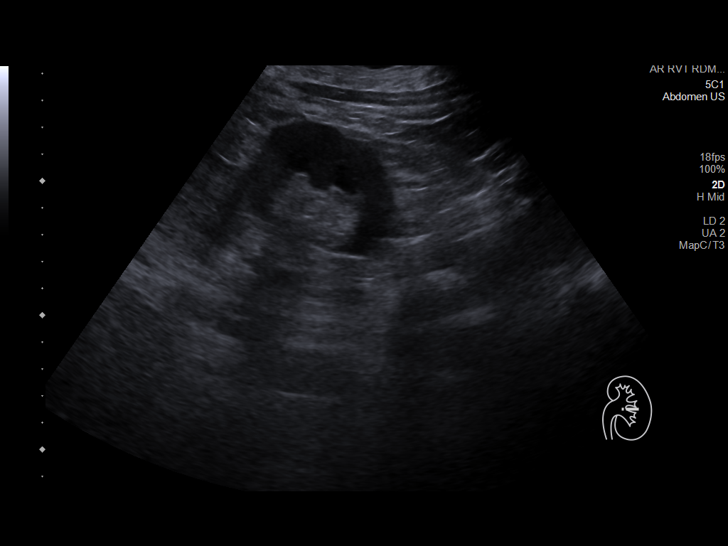
[im 100/100]
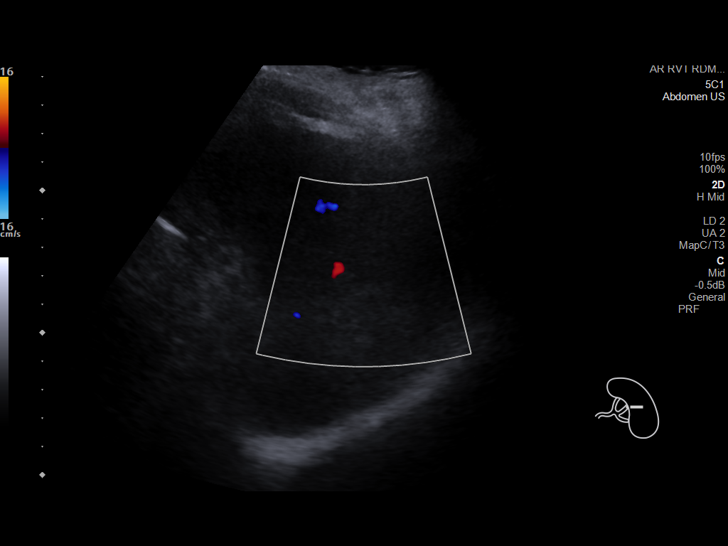

[13 of 25 positions shown; findings below may reference images not displayed]

FINDINGS: Gallbladder: Subcentimeter shadowing echogenic gallstones are seen
within the gallbladder lumen. The largest measures approximately
mm. A mild amount of heterogeneous sludge is also seen. The
gallbladder wall measures 3.3 mm. No sonographic Murphy sign noted
by sonographer.

Common bile duct: Diameter: 4.3 mm

Liver: No focal lesion identified. There is diffusely increased
echogenicity of the liver parenchyma. Portal vein is patent on color
Doppler imaging with normal direction of blood flow towards the
liver.

IVC: No abnormality visualized.

Pancreas: Poorly visualized secondary to overlying bowel gas.

Spleen: Size (5.4 cm) and appearance within normal limits.

Right Kidney: Length: 12.8 cm. Echogenicity within normal limits. No
mass or hydronephrosis visualized.

Left Kidney: Length: 12.1 cm. Echogenicity within normal limits. No
mass or hydronephrosis visualized.

Abdominal aorta: No aneurysm visualized.

Other findings: Of incidental note is the presence of a 5.2 cm x
cm x 4.4 cm anechoic structure within the mesentery.
IMPRESSION: 1. Cholelithiasis and gallbladder sludge without evidence of acute
cholecystitis.
2. Fatty liver.
3. Mesenteric cyst.

## 2022-06-27 IMAGING — CT CT HEAD W/O CM
3 series · 16 of 47 positions shown, 19 images · non-contrast
Comparison: None

CLINICAL DATA: Mental status changes.

EXAM:
CT HEAD WITHOUT CONTRAST
TECHNIQUE: Contiguous axial images were obtained from the base of the skull
through the vertex without intravenous contrast.

[Series 3: head w o · axial · 0.41mm/px · z∈[+54,+179]mm · 10 of 30 slices shown, 13 images]
[im 3/30  brain]
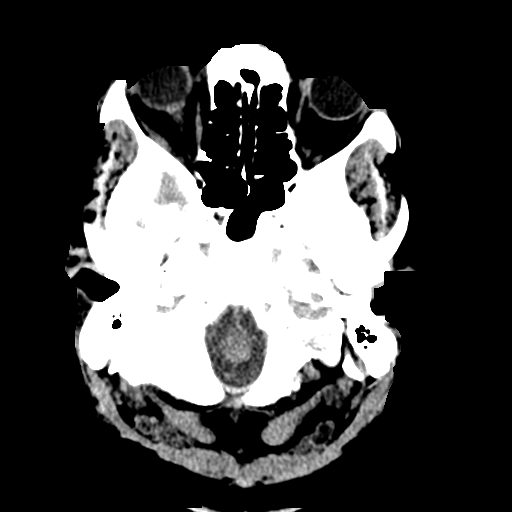
[im 3/30  bone]
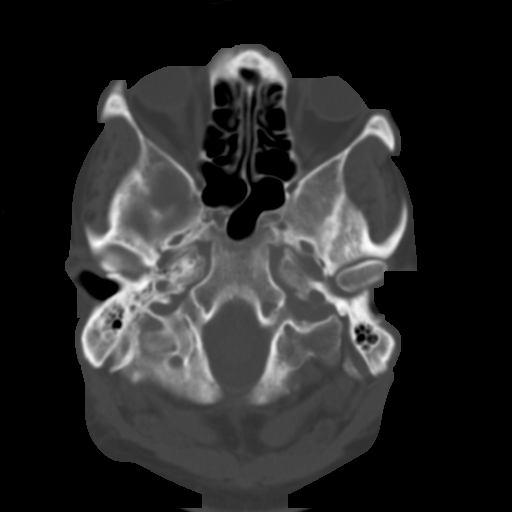
[im 6/30  brain]
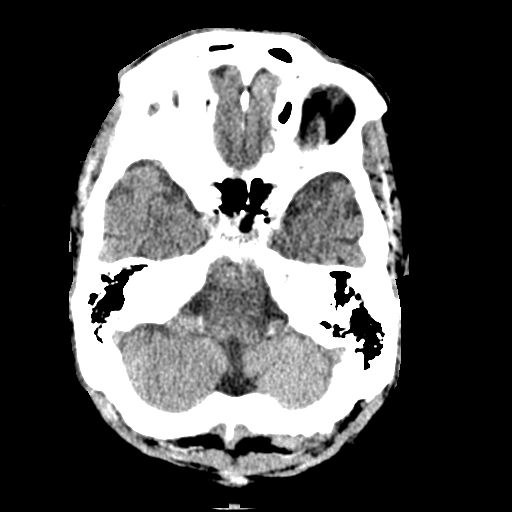
[im 9/30  brain]
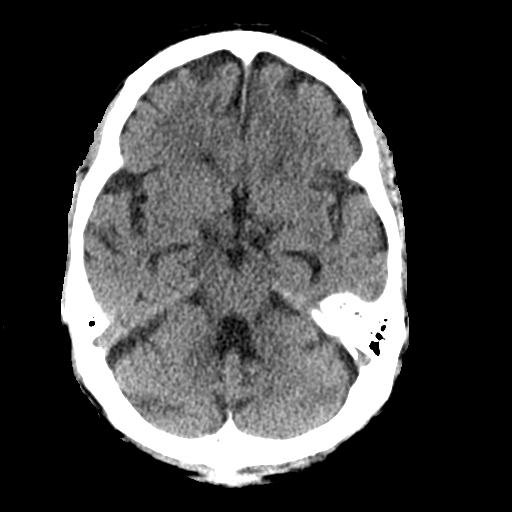
[im 11/30  brain]
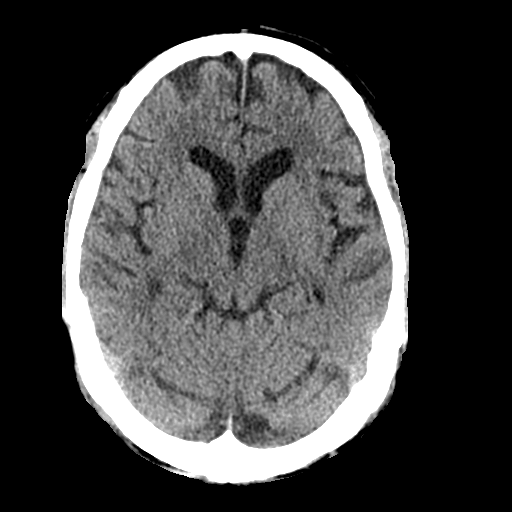
[im 14/30  brain]
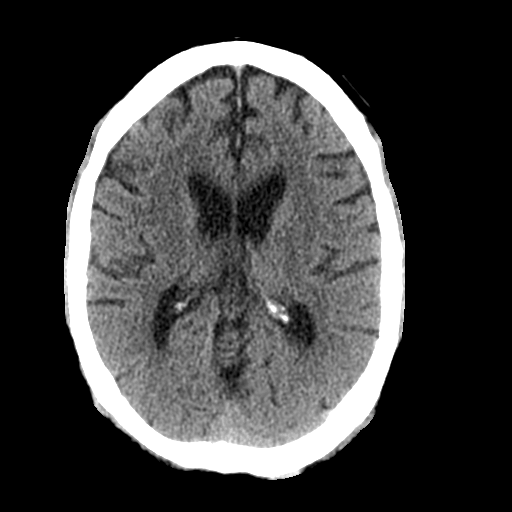
[im 14/30  bone]
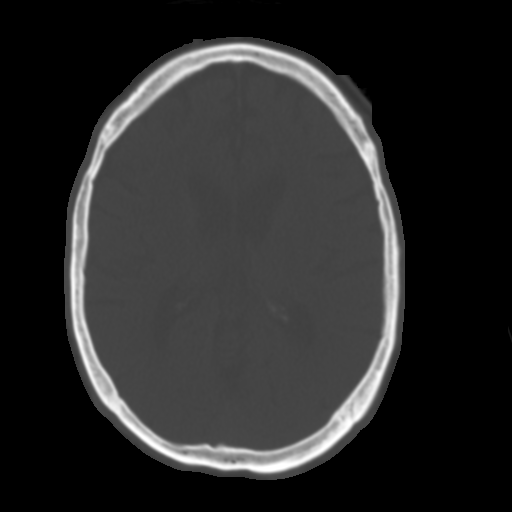
[im 17/30  brain]
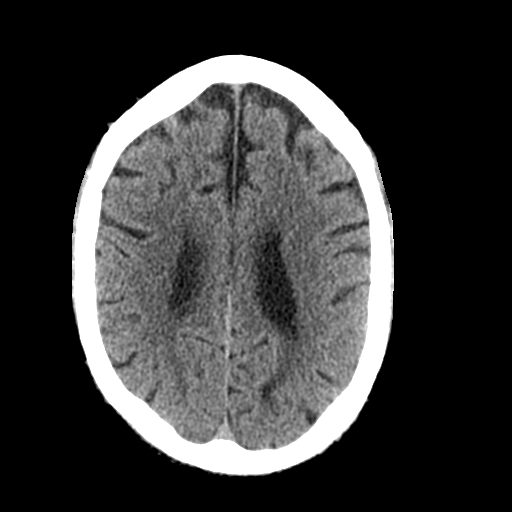
[im 20/30  brain]
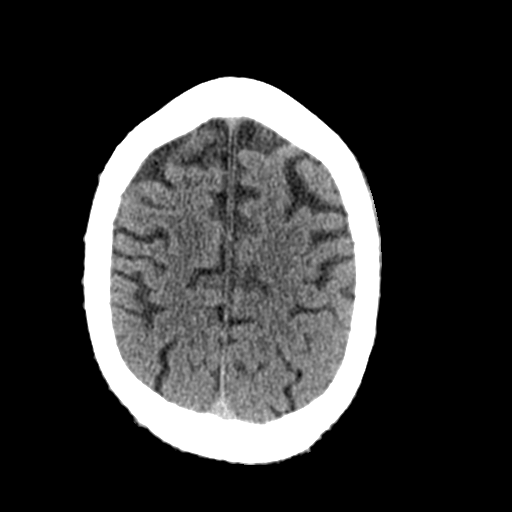
[im 23/30  brain]
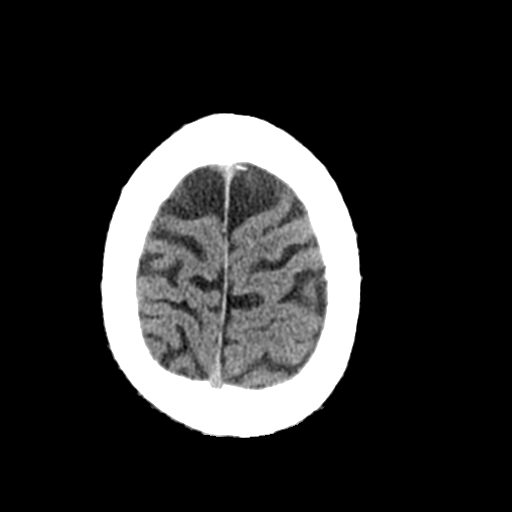
[im 25/30  brain]
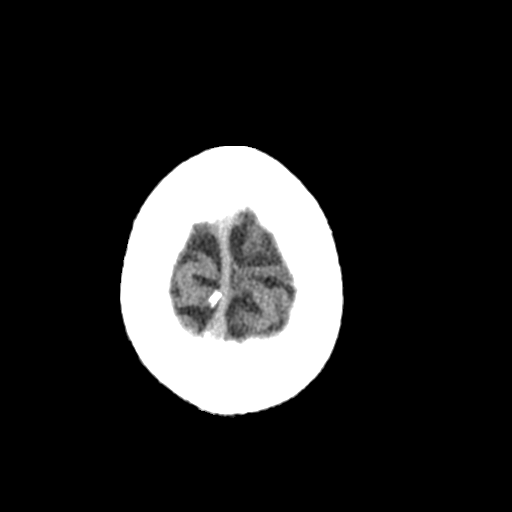
[im 25/30  bone]
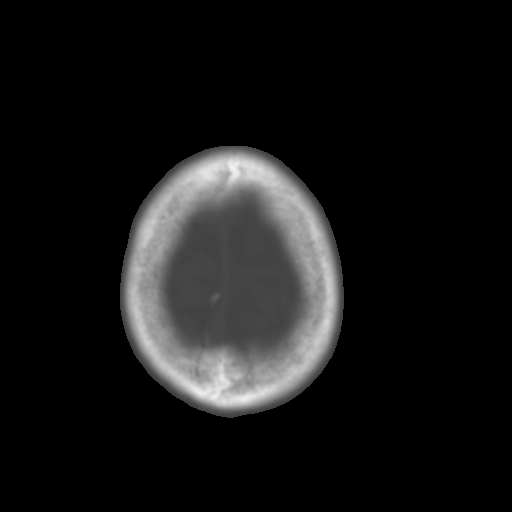
[im 28/30  brain]
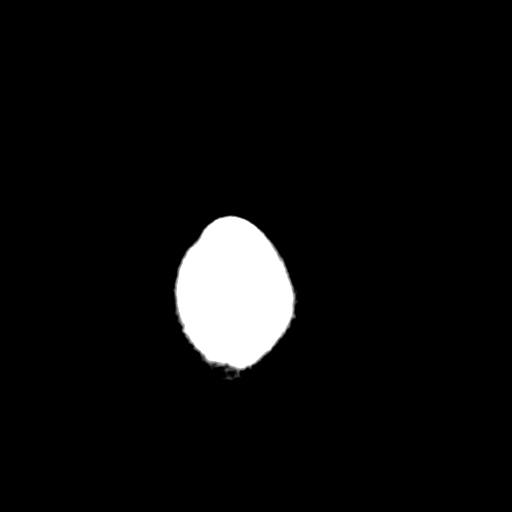

[Series 4: coronal soft · coronal · 0.31mm/px · 3 of 69 slices shown]
[im 23/69  brain]
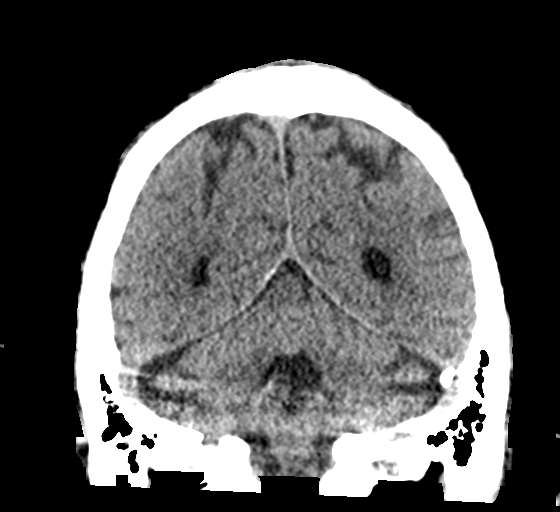
[im 31/69  brain]
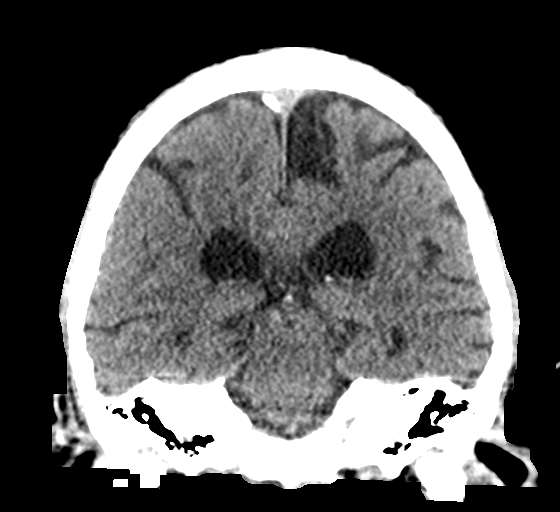
[im 38/69  brain]
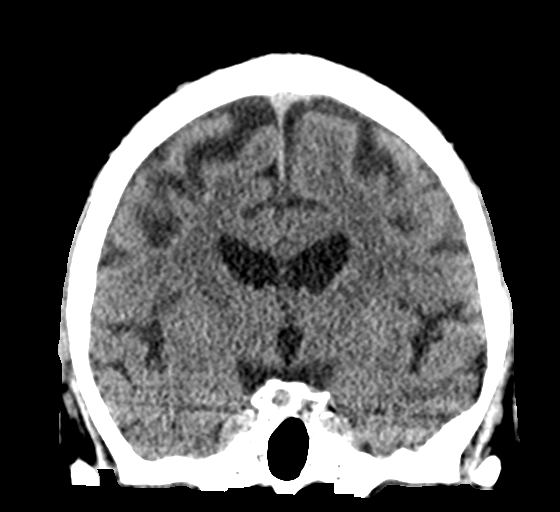

[Series 5: sagittal soft · sagittal · 0.32mm/px · 3 of 58 slices shown]
[im 20/58  brain]
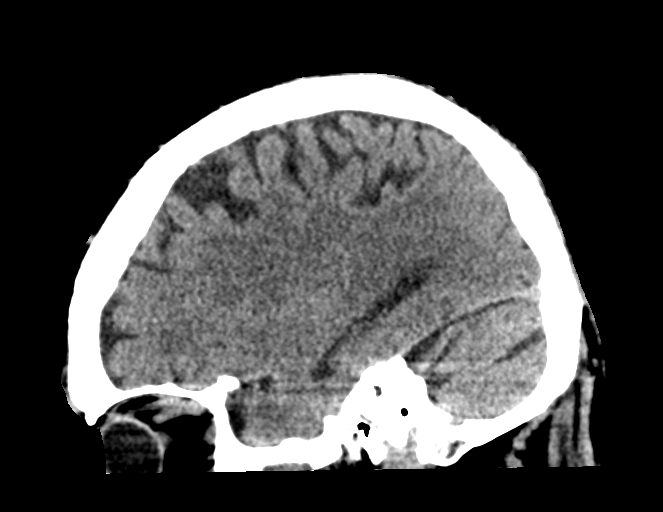
[im 29/58  brain]
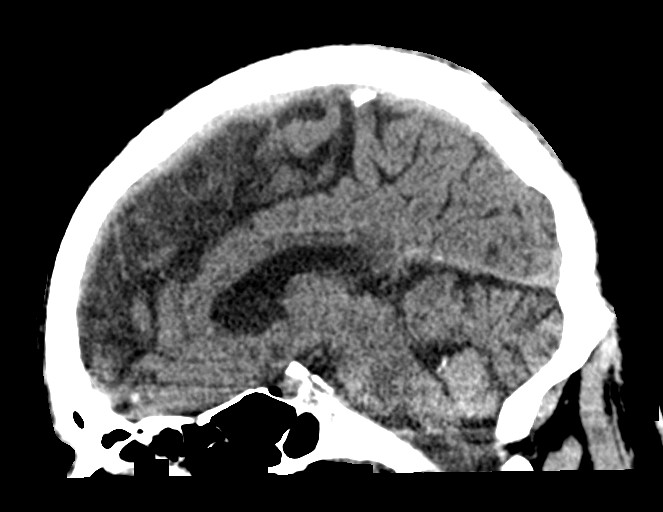
[im 39/58  brain]
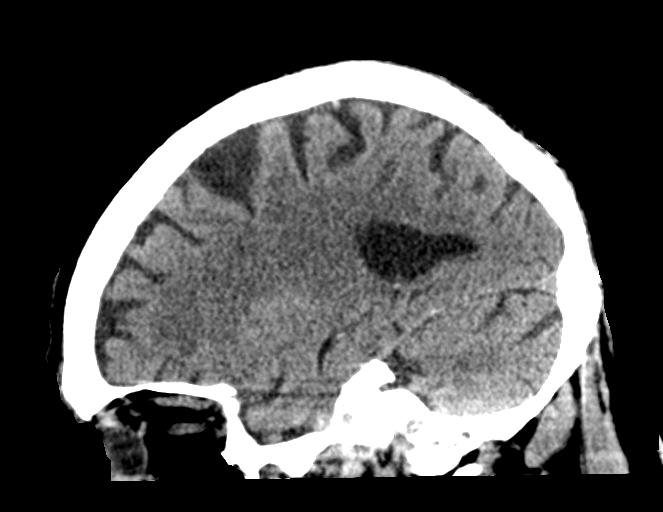

[16 of 47 positions shown; findings below may reference images not displayed]

FINDINGS: Brain: No evidence of acute infarction, hemorrhage, hydrocephalus,
extra-axial collection or mass lesion/mass effect. There is mild
diffuse low-attenuation within the subcortical and periventricular
white matter compatible with chronic microvascular disease.
Prominence of the sulci and ventricles compatible with brain
atrophy.

Vascular: No hyperdense vessel or unexpected calcification.

Skull: Normal. Negative for fracture or focal lesion.

Sinuses/Orbits: No acute finding.

Other: None.
IMPRESSION: 1. No acute intracranial abnormalities.
2. Chronic small vessel ischemic change and brain atrophy.

## 2022-08-21 IMAGING — DX DG CHEST 1V PORT
1 series · 2 of 2 positions shown · non-contrast
Comparison: 04/25/2020

CLINICAL DATA: Post TAVR

EXAM:
PORTABLE CHEST 1 VIEW

[Series 1: chest ap · 0.14mm/px · 2 of 2 slices shown]
[im 1/2]
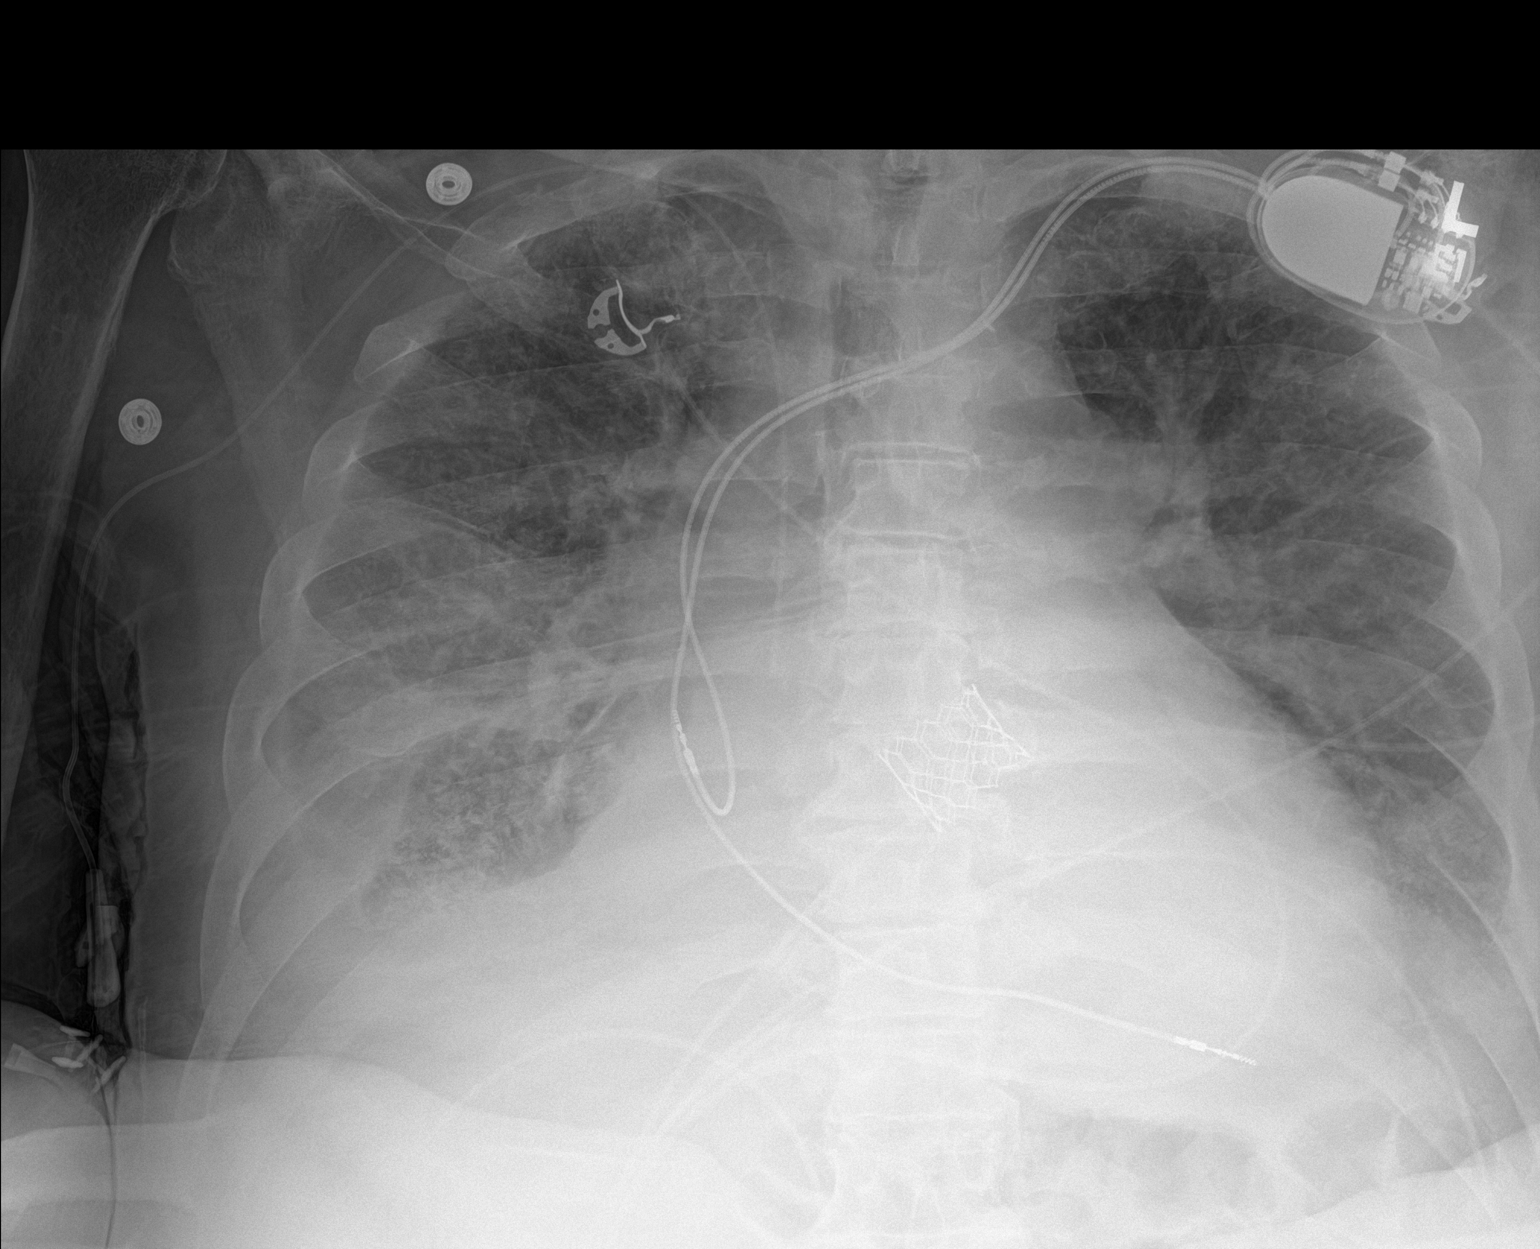
[im 2/2]
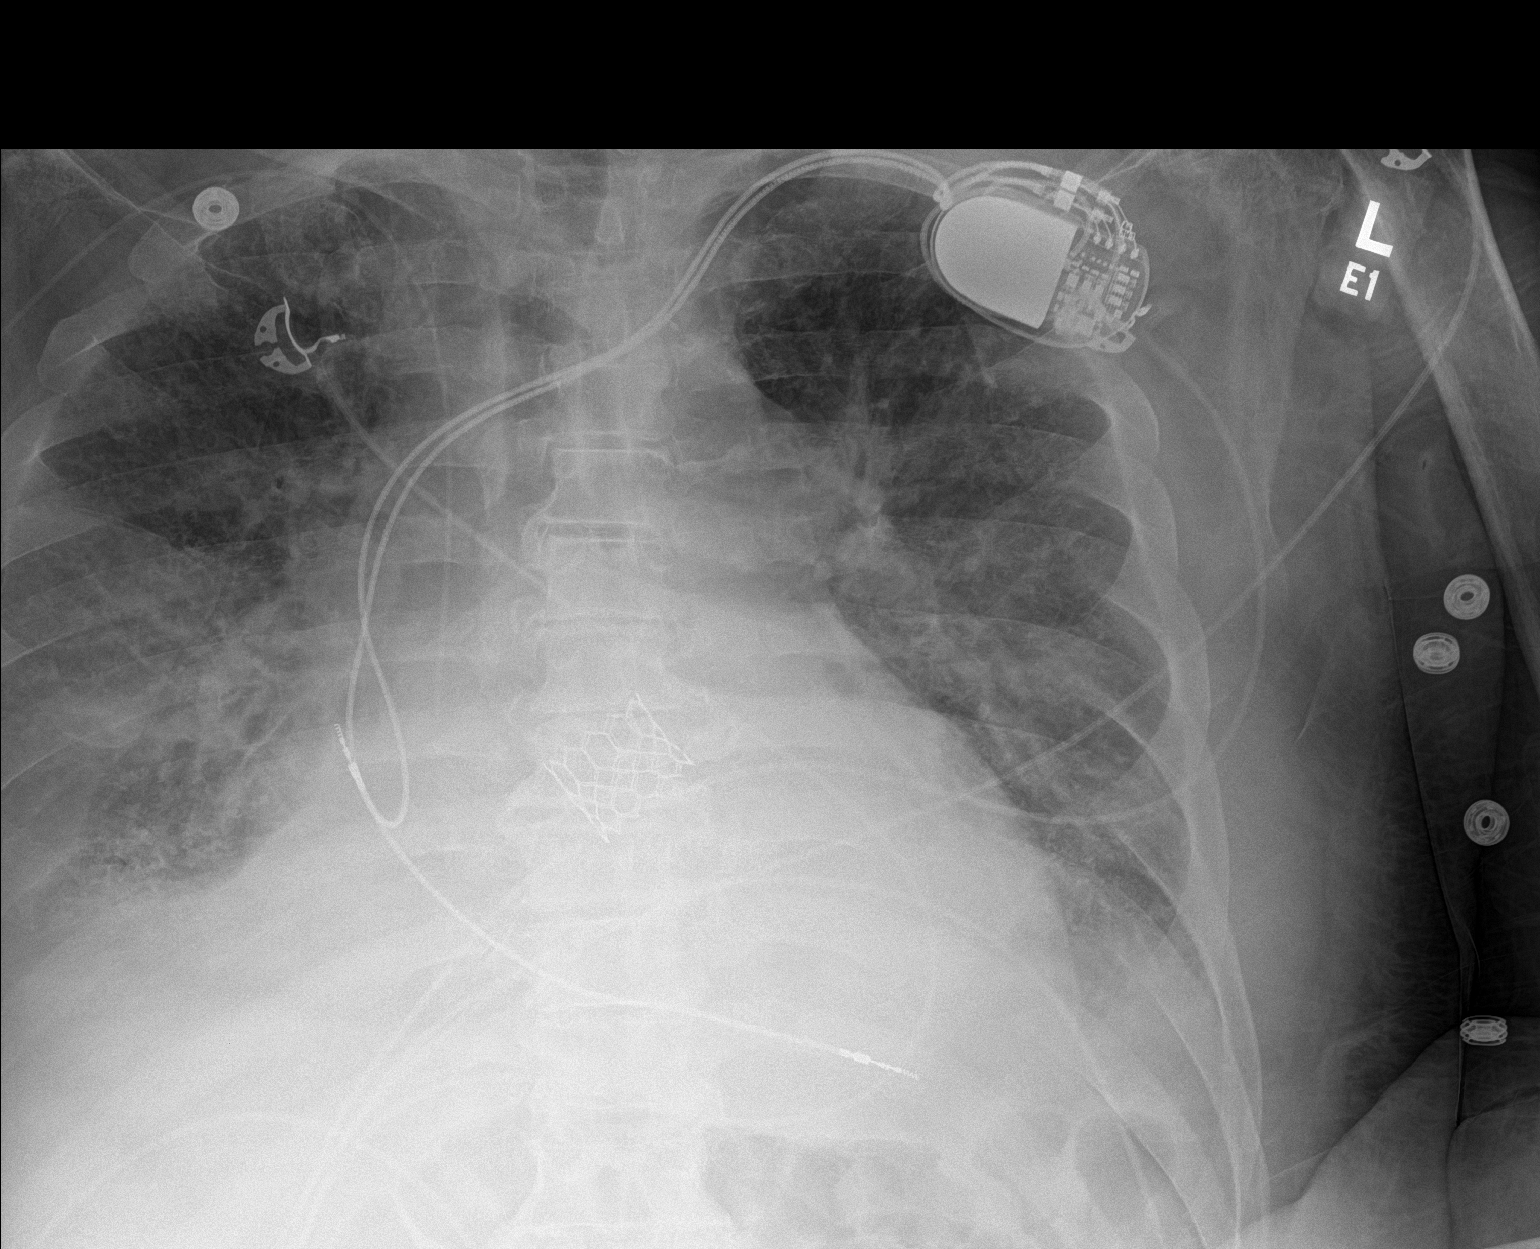

[2 of 2 positions shown; findings below may reference images not displayed]

FINDINGS: Left pacer remains in place, unchanged. Changes of TAVR. Right PICC
line in place with the tip at the cavoatrial junction. Cardiomegaly.
Vascular congestion and bilateral interstitial and airspace
opacities, likely edema. Small bilateral effusions. No pneumothorax.
IMPRESSION: Changes of TAVR.

Cardiomegaly.  Mild CHF.

Small bilateral effusions.
